# Patient Record
Sex: Female | Born: 1965 | Race: White | Hispanic: No | Smoking: Former smoker
Health system: Southern US, Community
[De-identification: ages and names within clinical notes are randomized; demographics above are authoritative.]

## PROBLEM LIST (undated history)

## (undated) DIAGNOSIS — K219 Gastro-esophageal reflux disease without esophagitis: Secondary | ICD-10-CM

## (undated) DIAGNOSIS — I2510 Atherosclerotic heart disease of native coronary artery without angina pectoris: Secondary | ICD-10-CM

## (undated) DIAGNOSIS — I509 Heart failure, unspecified: Secondary | ICD-10-CM

## (undated) DIAGNOSIS — I350 Nonrheumatic aortic (valve) stenosis: Secondary | ICD-10-CM

## (undated) DIAGNOSIS — E7800 Pure hypercholesterolemia, unspecified: Secondary | ICD-10-CM

## (undated) DIAGNOSIS — E079 Disorder of thyroid, unspecified: Secondary | ICD-10-CM

## (undated) DIAGNOSIS — R112 Nausea with vomiting, unspecified: Secondary | ICD-10-CM

## (undated) DIAGNOSIS — M1990 Unspecified osteoarthritis, unspecified site: Secondary | ICD-10-CM

## (undated) DIAGNOSIS — R011 Cardiac murmur, unspecified: Secondary | ICD-10-CM

## (undated) DIAGNOSIS — I739 Peripheral vascular disease, unspecified: Secondary | ICD-10-CM

## (undated) DIAGNOSIS — I70223 Atherosclerosis of native arteries of extremities with rest pain, bilateral legs: Secondary | ICD-10-CM

## (undated) DIAGNOSIS — Z9889 Other specified postprocedural states: Secondary | ICD-10-CM

## (undated) DIAGNOSIS — Z9289 Personal history of other medical treatment: Secondary | ICD-10-CM

## (undated) DIAGNOSIS — I10 Essential (primary) hypertension: Secondary | ICD-10-CM

## (undated) DIAGNOSIS — D649 Anemia, unspecified: Secondary | ICD-10-CM

## (undated) DIAGNOSIS — I219 Acute myocardial infarction, unspecified: Secondary | ICD-10-CM

## (undated) DIAGNOSIS — I340 Nonrheumatic mitral (valve) insufficiency: Secondary | ICD-10-CM

## (undated) DIAGNOSIS — E039 Hypothyroidism, unspecified: Secondary | ICD-10-CM

## (undated) DIAGNOSIS — E119 Type 2 diabetes mellitus without complications: Secondary | ICD-10-CM

## (undated) HISTORY — DX: Atherosclerosis of native arteries of extremities with rest pain, bilateral legs: I70.223

## (undated) HISTORY — PX: WISDOM TOOTH EXTRACTION: SHX21

## (undated) HISTORY — PX: AMPUTATION TOE: SHX6595

---

## 2000-05-21 ENCOUNTER — Ambulatory Visit (HOSPITAL_BASED_OUTPATIENT_CLINIC_OR_DEPARTMENT_OTHER): Admission: RE | Admit: 2000-05-21 | Discharge: 2000-05-21 | Payer: Self-pay | Admitting: Family Medicine

## 2000-06-30 ENCOUNTER — Ambulatory Visit (HOSPITAL_BASED_OUTPATIENT_CLINIC_OR_DEPARTMENT_OTHER): Admission: RE | Admit: 2000-06-30 | Discharge: 2000-06-30 | Payer: Self-pay | Admitting: Family Medicine

## 2017-10-28 DIAGNOSIS — G5601 Carpal tunnel syndrome, right upper limb: Secondary | ICD-10-CM | POA: Insufficient documentation

## 2017-10-28 DIAGNOSIS — K219 Gastro-esophageal reflux disease without esophagitis: Secondary | ICD-10-CM | POA: Insufficient documentation

## 2017-10-28 DIAGNOSIS — R079 Chest pain, unspecified: Secondary | ICD-10-CM | POA: Insufficient documentation

## 2020-01-06 DIAGNOSIS — S80829A Blister (nonthermal), unspecified lower leg, initial encounter: Secondary | ICD-10-CM | POA: Insufficient documentation

## 2020-01-11 ENCOUNTER — Emergency Department (HOSPITAL_COMMUNITY): Payer: BC Managed Care – PPO

## 2020-01-11 ENCOUNTER — Inpatient Hospital Stay (HOSPITAL_COMMUNITY)
Admission: EM | Admit: 2020-01-11 | Discharge: 2020-01-15 | DRG: 271 | Disposition: A | Discharge status: Home / Self Care | Payer: BC Managed Care – PPO | Attending: Family Medicine | Admitting: Family Medicine

## 2020-01-11 ENCOUNTER — Encounter (HOSPITAL_COMMUNITY): Payer: Self-pay

## 2020-01-11 ENCOUNTER — Other Ambulatory Visit: Payer: Self-pay

## 2020-01-11 DIAGNOSIS — E1165 Type 2 diabetes mellitus with hyperglycemia: Secondary | ICD-10-CM | POA: Diagnosis present

## 2020-01-11 DIAGNOSIS — Z8249 Family history of ischemic heart disease and other diseases of the circulatory system: Secondary | ICD-10-CM

## 2020-01-11 DIAGNOSIS — E039 Hypothyroidism, unspecified: Secondary | ICD-10-CM | POA: Diagnosis present

## 2020-01-11 DIAGNOSIS — L0390 Cellulitis, unspecified: Secondary | ICD-10-CM | POA: Diagnosis present

## 2020-01-11 DIAGNOSIS — E11621 Type 2 diabetes mellitus with foot ulcer: Secondary | ICD-10-CM | POA: Diagnosis present

## 2020-01-11 DIAGNOSIS — Z79890 Hormone replacement therapy (postmenopausal): Secondary | ICD-10-CM | POA: Diagnosis not present

## 2020-01-11 DIAGNOSIS — Z79899 Other long term (current) drug therapy: Secondary | ICD-10-CM

## 2020-01-11 DIAGNOSIS — I351 Nonrheumatic aortic (valve) insufficiency: Secondary | ICD-10-CM | POA: Diagnosis not present

## 2020-01-11 DIAGNOSIS — Z89422 Acquired absence of other left toe(s): Secondary | ICD-10-CM | POA: Diagnosis not present

## 2020-01-11 DIAGNOSIS — E7800 Pure hypercholesterolemia, unspecified: Secondary | ICD-10-CM | POA: Diagnosis present

## 2020-01-11 DIAGNOSIS — Z20822 Contact with and (suspected) exposure to covid-19: Secondary | ICD-10-CM | POA: Diagnosis present

## 2020-01-11 DIAGNOSIS — Z7982 Long term (current) use of aspirin: Secondary | ICD-10-CM | POA: Diagnosis not present

## 2020-01-11 DIAGNOSIS — L97519 Non-pressure chronic ulcer of other part of right foot with unspecified severity: Secondary | ICD-10-CM | POA: Diagnosis present

## 2020-01-11 DIAGNOSIS — E1151 Type 2 diabetes mellitus with diabetic peripheral angiopathy without gangrene: Secondary | ICD-10-CM | POA: Diagnosis present

## 2020-01-11 DIAGNOSIS — L97512 Non-pressure chronic ulcer of other part of right foot with fat layer exposed: Secondary | ICD-10-CM | POA: Diagnosis present

## 2020-01-11 DIAGNOSIS — Z6841 Body Mass Index (BMI) 40.0 and over, adult: Secondary | ICD-10-CM

## 2020-01-11 DIAGNOSIS — I350 Nonrheumatic aortic (valve) stenosis: Secondary | ICD-10-CM | POA: Diagnosis not present

## 2020-01-11 DIAGNOSIS — R011 Cardiac murmur, unspecified: Secondary | ICD-10-CM | POA: Diagnosis present

## 2020-01-11 DIAGNOSIS — L97509 Non-pressure chronic ulcer of other part of unspecified foot with unspecified severity: Secondary | ICD-10-CM | POA: Diagnosis not present

## 2020-01-11 DIAGNOSIS — Z87891 Personal history of nicotine dependence: Secondary | ICD-10-CM

## 2020-01-11 DIAGNOSIS — I739 Peripheral vascular disease, unspecified: Secondary | ICD-10-CM | POA: Diagnosis not present

## 2020-01-11 DIAGNOSIS — Z833 Family history of diabetes mellitus: Secondary | ICD-10-CM

## 2020-01-11 DIAGNOSIS — I998 Other disorder of circulatory system: Secondary | ICD-10-CM | POA: Diagnosis present

## 2020-01-11 DIAGNOSIS — I70221 Atherosclerosis of native arteries of extremities with rest pain, right leg: Secondary | ICD-10-CM | POA: Diagnosis present

## 2020-01-11 DIAGNOSIS — E785 Hyperlipidemia, unspecified: Secondary | ICD-10-CM | POA: Diagnosis present

## 2020-01-11 DIAGNOSIS — E1140 Type 2 diabetes mellitus with diabetic neuropathy, unspecified: Secondary | ICD-10-CM | POA: Diagnosis present

## 2020-01-11 DIAGNOSIS — I10 Essential (primary) hypertension: Secondary | ICD-10-CM | POA: Diagnosis present

## 2020-01-11 DIAGNOSIS — L97511 Non-pressure chronic ulcer of other part of right foot limited to breakdown of skin: Secondary | ICD-10-CM

## 2020-01-11 DIAGNOSIS — L03115 Cellulitis of right lower limb: Secondary | ICD-10-CM | POA: Diagnosis present

## 2020-01-11 HISTORY — DX: Disorder of thyroid, unspecified: E07.9

## 2020-01-11 HISTORY — DX: Essential (primary) hypertension: I10

## 2020-01-11 HISTORY — DX: Pure hypercholesterolemia, unspecified: E78.00

## 2020-01-11 HISTORY — DX: Type 2 diabetes mellitus without complications: E11.9

## 2020-01-11 LAB — URINALYSIS, ROUTINE W REFLEX MICROSCOPIC
Bilirubin Urine: NEGATIVE
Glucose, UA: >=500 mg/dL — AB
Hgb urine dipstick: NEGATIVE
Ketones, ur: NEGATIVE mg/dL
Leukocytes,Ua: NEGATIVE
Nitrite: NEGATIVE
Protein, ur: >=300 mg/dL — AB
Specific Gravity, Urine: 1.021 (ref 1.005–1.030)
pH: 5 (ref 5.0–8.0)

## 2020-01-11 LAB — SARS CORONAVIRUS 2 BY RT PCR (HOSPITAL ORDER, PERFORMED IN ~~LOC~~ HOSPITAL LAB): SARS Coronavirus 2: NEGATIVE

## 2020-01-11 LAB — CBC WITH DIFFERENTIAL/PLATELET
Abs Immature Granulocytes: 0.06 10*3/uL (ref 0.00–0.07)
Basophils Absolute: 0.1 10*3/uL (ref 0.0–0.1)
Basophils Relative: 0 %
Eosinophils Absolute: 0.1 10*3/uL (ref 0.0–0.5)
Eosinophils Relative: 1 %
HCT: 31.7 % — ABNORMAL LOW (ref 36.0–46.0)
Hemoglobin: 10 g/dL — ABNORMAL LOW (ref 12.0–15.0)
Immature Granulocytes: 1 %
Lymphocytes Relative: 22 %
Lymphs Abs: 2.5 10*3/uL (ref 0.7–4.0)
MCH: 28.7 pg (ref 26.0–34.0)
MCHC: 31.5 g/dL (ref 30.0–36.0)
MCV: 91.1 fL (ref 80.0–100.0)
Monocytes Absolute: 0.8 10*3/uL (ref 0.1–1.0)
Monocytes Relative: 7 %
Neutro Abs: 7.8 10*3/uL — ABNORMAL HIGH (ref 1.7–7.7)
Neutrophils Relative %: 69 %
Platelets: 244 10*3/uL (ref 150–400)
RBC: 3.48 MIL/uL — ABNORMAL LOW (ref 3.87–5.11)
RDW: 13.1 % (ref 11.5–15.5)
WBC: 11.4 10*3/uL — ABNORMAL HIGH (ref 4.0–10.5)
nRBC: 0 % (ref 0.0–0.2)

## 2020-01-11 LAB — CBG MONITORING, ED: Glucose-Capillary: 313 mg/dL — ABNORMAL HIGH (ref 70–99)

## 2020-01-11 LAB — COMPREHENSIVE METABOLIC PANEL
ALT: 20 U/L (ref 0–44)
AST: 14 U/L — ABNORMAL LOW (ref 15–41)
Albumin: 3.3 g/dL — ABNORMAL LOW (ref 3.5–5.0)
Alkaline Phosphatase: 91 U/L (ref 38–126)
Anion gap: 10 (ref 5–15)
BUN: 25 mg/dL — ABNORMAL HIGH (ref 6–20)
CO2: 25 mmol/L (ref 22–32)
Calcium: 9.1 mg/dL (ref 8.9–10.3)
Chloride: 103 mmol/L (ref 98–111)
Creatinine, Ser: 1.12 mg/dL — ABNORMAL HIGH (ref 0.44–1.00)
GFR calc Af Amer: >60 mL/min (ref >60)
GFR calc non Af Amer: 56 mL/min — ABNORMAL LOW (ref >60)
Glucose, Bld: 341 mg/dL — ABNORMAL HIGH (ref 70–99)
Potassium: 4.2 mmol/L (ref 3.5–5.1)
Sodium: 138 mmol/L (ref 135–145)
Total Bilirubin: 0.5 mg/dL (ref 0.3–1.2)
Total Protein: 7.1 g/dL (ref 6.5–8.1)

## 2020-01-11 LAB — LACTIC ACID, PLASMA: Lactic Acid, Venous: 1.7 mmol/L (ref 0.5–1.9)

## 2020-01-11 MED ORDER — ONDANSETRON HCL 4 MG PO TABS: 4.0000 mg | ORAL_TABLET | Freq: Four times a day (QID) | ORAL | Status: DC | PRN

## 2020-01-11 MED ORDER — LEVOTHYROXINE SODIUM 75 MCG PO TABS
150.0000 ug | ORAL_TABLET | Freq: Every day | ORAL | Status: DC
  Administered 2020-01-12: 150 ug via ORAL
  Filled 2020-01-11: qty 3

## 2020-01-11 MED ORDER — ONDANSETRON HCL 4 MG/2ML IJ SOLN
4.0000 mg | Freq: Four times a day (QID) | INTRAMUSCULAR | Status: DC | PRN
  Filled 2020-01-11: qty 2

## 2020-01-11 MED ORDER — LISINOPRIL-HYDROCHLOROTHIAZIDE 20-25 MG PO TABS: 2.0000 | ORAL_TABLET | Freq: Every morning | ORAL | Status: DC

## 2020-01-11 MED ORDER — SODIUM CHLORIDE 0.9 % IV SOLN: INTRAVENOUS | Status: DC

## 2020-01-11 MED ORDER — LISINOPRIL 40 MG PO TABS
40.0000 mg | ORAL_TABLET | Freq: Every day | ORAL | Status: DC
  Administered 2020-01-12 – 2020-01-15 (×4): 40 mg via ORAL
  Filled 2020-01-11 (×2): qty 1
  Filled 2020-01-11 (×2): qty 4

## 2020-01-11 MED ORDER — GABAPENTIN 100 MG PO CAPS
200.0000 mg | ORAL_CAPSULE | Freq: Every day | ORAL | Status: DC
  Administered 2020-01-11 – 2020-01-14 (×4): 200 mg via ORAL
  Filled 2020-01-11 (×4): qty 2

## 2020-01-11 MED ORDER — VANCOMYCIN HCL 750 MG/150ML IV SOLN
750.0000 mg | Freq: Two times a day (BID) | INTRAVENOUS | Status: DC
  Administered 2020-01-12 – 2020-01-15 (×7): 750 mg via INTRAVENOUS
  Filled 2020-01-11 (×10): qty 150

## 2020-01-11 MED ORDER — HYDROCHLOROTHIAZIDE 50 MG PO TABS
50.0000 mg | ORAL_TABLET | Freq: Every day | ORAL | Status: DC
  Filled 2020-01-11 (×2): qty 1

## 2020-01-11 MED ORDER — INSULIN ASPART 100 UNIT/ML ~~LOC~~ SOLN
0.0000 [IU] | Freq: Three times a day (TID) | SUBCUTANEOUS | Status: DC
  Administered 2020-01-12: 3 [IU] via SUBCUTANEOUS
  Filled 2020-01-11: qty 1

## 2020-01-11 MED ORDER — ATORVASTATIN CALCIUM 40 MG PO TABS
40.0000 mg | ORAL_TABLET | Freq: Every day | ORAL | Status: DC
  Administered 2020-01-12 – 2020-01-15 (×3): 40 mg via ORAL
  Filled 2020-01-11 (×3): qty 1

## 2020-01-11 MED ORDER — PANTOPRAZOLE SODIUM 40 MG PO TBEC
40.0000 mg | DELAYED_RELEASE_TABLET | Freq: Every day | ORAL | Status: DC
  Administered 2020-01-12 – 2020-01-15 (×3): 40 mg via ORAL
  Filled 2020-01-11 (×3): qty 1

## 2020-01-11 MED ORDER — INSULIN GLARGINE 100 UNIT/ML ~~LOC~~ SOLN
10.0000 [IU] | Freq: Every day | SUBCUTANEOUS | Status: DC
  Administered 2020-01-11 – 2020-01-14 (×4): 10 [IU] via SUBCUTANEOUS
  Filled 2020-01-11 (×7): qty 0.1

## 2020-01-11 MED ORDER — VANCOMYCIN HCL 2000 MG/400ML IV SOLN
2000.0000 mg | Freq: Once | INTRAVENOUS | Status: AC
  Administered 2020-01-11: 2000 mg via INTRAVENOUS
  Filled 2020-01-11: qty 400

## 2020-01-11 MED ORDER — VANCOMYCIN HCL IN DEXTROSE 1-5 GM/200ML-% IV SOLN
1000.0000 mg | Freq: Once | INTRAVENOUS | Status: AC
  Administered 2020-01-11: 1000 mg via INTRAVENOUS
  Filled 2020-01-11: qty 200

## 2020-01-11 MED ORDER — ACETAMINOPHEN 500 MG PO TABS
1000.0000 mg | ORAL_TABLET | Freq: Four times a day (QID) | ORAL | Status: DC | PRN
  Administered 2020-01-12 – 2020-01-13 (×2): 1000 mg via ORAL
  Filled 2020-01-11 (×2): qty 2

## 2020-01-11 MED ORDER — ENOXAPARIN SODIUM 40 MG/0.4ML ~~LOC~~ SOLN
40.0000 mg | Freq: Two times a day (BID) | SUBCUTANEOUS | Status: DC
  Administered 2020-01-11 – 2020-01-13 (×4): 40 mg via SUBCUTANEOUS
  Filled 2020-01-11 (×4): qty 0.4

## 2020-01-11 MED ORDER — HYDRALAZINE HCL 25 MG PO TABS: 25.0000 mg | ORAL_TABLET | Freq: Four times a day (QID) | ORAL | Status: DC | PRN

## 2020-01-11 NOTE — Progress Notes (Signed)
Pharmacy Antibiotic Note  Kayla Schmitt is a 54 y.o. female admitted on 01/11/2020 with cellulitis.  Pharmacy has been consulted for vancomycion dosing.  Plan: Vancomycin 750 IV every 12 hours.  Goal trough 15-20 mcg/mL.  Height: 5\' 4"  (162.6 cm) Weight: 108.9 kg (240 lb) IBW/kg (Calculated) : 54.7  Temp (24hrs), Avg:99 F (37.2 C), Min:98.7 F (37.1 C), Max:99.2 F (37.3 C)  Recent Labs  Lab 01/11/20 1233  WBC 11.4*  CREATININE 1.12*  LATICACIDVEN 1.7    Estimated Creatinine Clearance: 70.1 mL/min (A) (by C-G formula based on SCr of 1.12 mg/dL (H)).    Allergies  Allergen Reactions   Doxycycline Nausea And Vomiting    extreme   Penicillins     Did it involve swelling of the face/tongue/throat, SOB, or low BP? no Did it involve sudden or severe rash/hives, skin peeling, or any reaction on the inside of your mouth or nose? yes Did you need to seek medical attention at a hospital or doctor's office? yes When did it last happen?childhood allergy If all above answers are NO, may proceed with cephalosporin use.     Antimicrobials this admission: 9/13 vancomycin >>   Microbiology results: 9/13 BCx: sent  Thank you for allowing pharmacy to be a part of this patients care.  10/13 [place]hower 01/11/2020 6:29 PM
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
## 2020-01-11 NOTE — ED Triage Notes (Signed)
Pt with wound on the top of her right foot, fever up to 99.9. Pt is diabetic. Pt has had wound approx 6-8 weeks

## 2020-01-11 NOTE — ED Provider Notes (Signed)
[facility] EMERGENCY DEPARTMENT Provider Note   CSN: [id number] Arrival date & time: 01/11/20  1113     History Chief Complaint  Patient presents with  . Foot Pain    Kayla Schmitt is a 54 y.o. female.  The history is provided by the patient. No language interpreter was used.  Foot Pain This is a new problem. The current episode started more than 2 days ago. The problem occurs constantly. The problem has been gradually worsening. Pertinent negatives include no headaches and no shortness of breath. Nothing aggravates the symptoms. Nothing relieves the symptoms. The treatment provided no relief.  Pt complains of a fever for the past 2 days.  Pt has a wound on her foot that she had had since July.  Pt has been on Keflex for several weeks.  Pt was scheduled for vascular studies today but came here due to fever.     Past Medical History:  Diagnosis Date  . Diabetes mellitus without complication (HCC)   . Hypercholesteremia   . Hypertension   . Thyroid disease     There are no problems to display for this patient.   Past Surgical History:  Procedure Laterality Date  . AMPUTATION TOE       OB History   No obstetric history on file.     No family history on file.  Social History   Tobacco Use  . Smoking status: Former Games developer  . Smokeless tobacco: Never Used  Substance Use Topics  . Alcohol use: Not Currently  . Drug use: Yes    Types: Marijuana    Comment: occ    Home Medications Prior to Admission medications   Not on File    Allergies    Penicillins  Review of Systems   Review of Systems  Constitutional: Positive for fever.  Respiratory: Negative for shortness of breath.   Musculoskeletal: Positive for myalgias.  Skin: Positive for wound.  Neurological: Negative for headaches.  All other systems reviewed and are negative.   Physical Exam Updated Vital Signs BP (!) 150/85 (BP Location: Right Arm)   Pulse (!) 104   Temp 99.2 F (37.3 C) (Oral)    Resp 18   Ht 5\' 4"  (1.626 m)   Wt 108.9 kg   SpO2 98%   BMI 41.20 kg/m   Physical Exam Vitals and nursing note reviewed.  Constitutional:      Appearance: She is well-developed.  HENT:     Head: Normocephalic.  Cardiovascular:     Rate and Rhythm: Normal rate and regular rhythm.     Heart sounds: Normal heart sounds.  Pulmonary:     Effort: Pulmonary effort is normal.  Abdominal:     General: There is no distension.  Musculoskeletal:        General: Normal range of motion.     Cervical back: Normal range of motion.     Comments: Redness right foot, 3cm dry appearing wound. 1cm ulcer,   Neurological:     Mental Status: She is alert and oriented to person, place, and time.  Psychiatric:        Mood and Affect: Mood normal.     ED Results / Procedures / Treatments   Labs (all labs ordered are listed, but only abnormal results are displayed) Labs Reviewed  CBC WITH DIFFERENTIAL/PLATELET - Abnormal; Notable for the following components:      Result Value   WBC 11.4 (*)    RBC 3.48 (*)    Hemoglobin  10.0 (*)    HCT 31.7 (*)    Neutro Abs 7.8 (*)    All other components within normal limits  COMPREHENSIVE METABOLIC PANEL - Abnormal; Notable for the following components:   Glucose, Bld 341 (*)    BUN 25 (*)    Creatinine, Ser 1.12 (*)    Albumin 3.3 (*)    AST 14 (*)    GFR calc non Af Amer 56 (*)    All other components within normal limits  URINALYSIS, ROUTINE W REFLEX MICROSCOPIC - Abnormal; Notable for the following components:   APPearance HAZY (*)    Glucose, UA >=500 (*)    Protein, ur >=300 (*)    Bacteria, UA RARE (*)    All other components within normal limits  CBG MONITORING, ED - Abnormal; Notable for the following components:   Glucose-Capillary 313 (*)    All other components within normal limits  LACTIC ACID, PLASMA    EKG None  Radiology DG Foot Complete Right  Result Date: 01/11/2020 CLINICAL DATA:  Foot wound dorsally EXAM: RIGHT FOOT  COMPLETE - 3+ VIEW COMPARISON:  None. FINDINGS: Negative for fracture or osteomyelitis Soft tissue swelling and wound in the dorsum of the metatarsal region. No arthropathy. Calcaneal spurring.  Arterial calcification. IMPRESSION: No acute skeletal abnormality. Electronically Signed   By: Marlan Palau M.D.   On: 01/11/2020 14:03    Procedures Procedures (including critical care time)  Medications Ordered in ED Medications - No data to display  ED Course  I have reviewed the triage vital signs and the nursing notes.  Pertinent labs & imaging results that were available during my care of the patient were reviewed by me and considered in my medical decision making (see chart for details).    MDM Rules/Calculators/A&P                          MDM: Patient is being seen by the wound care center in [facility] she was scheduled for vascular studies today.  Patient reports the provider at the wound care clinic could not feel a pulse in her foot.  Patient reports they told her they were going to refer her to a vascular surgeon but she has not received that referral yet.  Patient reports she has been on Keflex for approximately 4 weeks she was also on Bactrim for a week.  Patient states she did have 3 ulcers prior to starting on the Keflex.  An ulcer on her left leg has resolved the ulcer on her right foot is improving.  Patient reports her foot has not been hot and red prior to yesterday.   Pt has elevated bun and creat, slight elevation of wbc count.  Pt has been on outpatient antibiotics.  Pt has covid testing today that was negative  I will consult hospitalist for admission as patient has a cellulitis and has failed outpatient treatment. Final Clinical Impression(s) / ED Diagnoses Final diagnoses:  Cellulitis of right foot    Rx / DC Orders ED Discharge Orders    None       Osie Cheeks 01/11/20 1539    Bethann Berkshire, MD 01/13/20 1253

## 2020-01-12 ENCOUNTER — Inpatient Hospital Stay (HOSPITAL_COMMUNITY): Payer: BC Managed Care – PPO

## 2020-01-12 ENCOUNTER — Other Ambulatory Visit: Payer: Self-pay

## 2020-01-12 DIAGNOSIS — I350 Nonrheumatic aortic (valve) stenosis: Secondary | ICD-10-CM

## 2020-01-12 DIAGNOSIS — I351 Nonrheumatic aortic (valve) insufficiency: Secondary | ICD-10-CM

## 2020-01-12 LAB — ECHOCARDIOGRAM COMPLETE
AR max vel: 1.51 cm2
AV Area VTI: 1.57 cm2
AV Area mean vel: 1.44 cm2
AV Mean grad: 15 mmHg
AV Peak grad: 24.6 mmHg
Ao pk vel: 2.48 m/s
Area-P 1/2: 3.63 cm2
Height: 64 in
S' Lateral: 2.45 cm
Weight: 3840 oz

## 2020-01-12 LAB — CBC
HCT: 28.4 % — ABNORMAL LOW (ref 36.0–46.0)
Hemoglobin: 9.1 g/dL — ABNORMAL LOW (ref 12.0–15.0)
MCH: 29.2 pg (ref 26.0–34.0)
MCHC: 32 g/dL (ref 30.0–36.0)
MCV: 91 fL (ref 80.0–100.0)
Platelets: 219 10*3/uL (ref 150–400)
RBC: 3.12 MIL/uL — ABNORMAL LOW (ref 3.87–5.11)
RDW: 13.1 % (ref 11.5–15.5)
WBC: 7.9 10*3/uL (ref 4.0–10.5)
nRBC: 0 % (ref 0.0–0.2)

## 2020-01-12 LAB — COMPREHENSIVE METABOLIC PANEL
ALT: 18 U/L (ref 0–44)
AST: 11 U/L — ABNORMAL LOW (ref 15–41)
Albumin: 3 g/dL — ABNORMAL LOW (ref 3.5–5.0)
Alkaline Phosphatase: 91 U/L (ref 38–126)
Anion gap: 8 (ref 5–15)
BUN: 22 mg/dL — ABNORMAL HIGH (ref 6–20)
CO2: 24 mmol/L (ref 22–32)
Calcium: 8.5 mg/dL — ABNORMAL LOW (ref 8.9–10.3)
Chloride: 103 mmol/L (ref 98–111)
Creatinine, Ser: 0.81 mg/dL (ref 0.44–1.00)
GFR calc Af Amer: >60 mL/min (ref >60)
GFR calc non Af Amer: >60 mL/min (ref >60)
Glucose, Bld: 303 mg/dL — ABNORMAL HIGH (ref 70–99)
Potassium: 3.6 mmol/L (ref 3.5–5.1)
Sodium: 135 mmol/L (ref 135–145)
Total Bilirubin: 0.5 mg/dL (ref 0.3–1.2)
Total Protein: 6.6 g/dL (ref 6.5–8.1)

## 2020-01-12 LAB — SEDIMENTATION RATE: Sed Rate: 125 mm/hr — ABNORMAL HIGH (ref 0–22)

## 2020-01-12 LAB — HEMOGLOBIN A1C
Hgb A1c MFr Bld: 11 % — ABNORMAL HIGH (ref 4.8–5.6)
Mean Plasma Glucose: 269 mg/dL

## 2020-01-12 LAB — GLUCOSE, CAPILLARY
Glucose-Capillary: 225 mg/dL — ABNORMAL HIGH (ref 70–99)
Glucose-Capillary: 237 mg/dL — ABNORMAL HIGH (ref 70–99)

## 2020-01-12 LAB — CBG MONITORING, ED
Glucose-Capillary: 240 mg/dL — ABNORMAL HIGH (ref 70–99)
Glucose-Capillary: 343 mg/dL — ABNORMAL HIGH (ref 70–99)

## 2020-01-12 LAB — HIV ANTIBODY (ROUTINE TESTING W REFLEX): HIV Screen 4th Generation wRfx: NONREACTIVE

## 2020-01-12 LAB — C-REACTIVE PROTEIN: CRP: 15.8 mg/dL — ABNORMAL HIGH (ref <1.0)

## 2020-01-12 MED ORDER — INSULIN ASPART 100 UNIT/ML ~~LOC~~ SOLN
4.0000 [IU] | Freq: Three times a day (TID) | SUBCUTANEOUS | Status: DC
  Administered 2020-01-12 – 2020-01-13 (×3): 4 [IU] via SUBCUTANEOUS
  Filled 2020-01-12: qty 1

## 2020-01-12 MED ORDER — INSULIN ASPART 100 UNIT/ML ~~LOC~~ SOLN
0.0000 [IU] | Freq: Every day | SUBCUTANEOUS | Status: DC
  Administered 2020-01-12 – 2020-01-14 (×2): 2 [IU] via SUBCUTANEOUS

## 2020-01-12 MED ORDER — LEVOTHYROXINE SODIUM 75 MCG PO TABS
150.0000 ug | ORAL_TABLET | Freq: Every day | ORAL | Status: DC
  Administered 2020-01-13 – 2020-01-15 (×3): 150 ug via ORAL
  Filled 2020-01-12 (×3): qty 2

## 2020-01-12 MED ORDER — IOHEXOL 350 MG/ML SOLN
150.0000 mL | Freq: Once | INTRAVENOUS | Status: AC | PRN
  Administered 2020-01-12: 125 mL via INTRAVENOUS

## 2020-01-12 MED ORDER — INSULIN ASPART 100 UNIT/ML ~~LOC~~ SOLN
0.0000 [IU] | Freq: Three times a day (TID) | SUBCUTANEOUS | Status: DC
  Administered 2020-01-12: 7 [IU] via SUBCUTANEOUS
  Administered 2020-01-12: 15 [IU] via SUBCUTANEOUS
  Administered 2020-01-13: 11 [IU] via SUBCUTANEOUS
  Administered 2020-01-13 (×2): 4 [IU] via SUBCUTANEOUS
  Administered 2020-01-14: 7 [IU] via SUBCUTANEOUS
  Administered 2020-01-15: 4 [IU] via SUBCUTANEOUS
  Filled 2020-01-12: qty 1

## 2020-01-12 MED ORDER — INSULIN STARTER KIT- PEN NEEDLES (ENGLISH)
1.0000 | Freq: Once | Status: DC
  Filled 2020-01-12: qty 1

## 2020-01-12 MED ORDER — LIVING WELL WITH DIABETES BOOK: Freq: Once | Status: AC

## 2020-01-12 MED ORDER — COLLAGENASE 250 UNIT/GM EX OINT
TOPICAL_OINTMENT | Freq: Every day | CUTANEOUS | Status: DC
  Filled 2020-01-12: qty 30

## 2020-01-12 NOTE — Consult Note (Addendum)
WOC Nurse Consult Note: Reason for Consult: Consult requested for right foot.  Performed remotely after review of the photo and progress notes in the EMR.  Wound type: Full thickness wound to right anterior foot; 100% yellow slough Measurement: Approx 5X5cm with mod amt tan drainage, according to progress notes Periwound: Generalized erythremia and edema surrounding Dressing procedure/placement/frequency: Topical treatment orders provided for bedside nurses to perform daily to provide enzymatic debridement of nonviable tissue as follows: Apply Santyl to right foot wound Q day, then cover with moist gauze and dry dressing and kerlex.   ABI results indicated deceased perfusion to right foot. MRI indicates: "Short segment complete tear of the extensor hallucis longus tendon deep to the ulcer with 1.2 cm retraction." This complex medical condition is beyond the scope of practice for WOC nurses.  Please consult podiatry or ortho/surgical team for further plan of care. Please re-consult if further assistance is needed.  Thank-you,  Cammie Mcgee MSN, RN, CWOCN, [place], CNS [phone]

## 2020-01-12 NOTE — Progress Notes (Signed)
Inpatient Diabetes Program Recommendations  AACE/ADA: New Consensus Statement on Inpatient Glycemic Control (2015)  Target Ranges:  Prepandial:   less than 140 mg/dL      Peak postprandial:   less than 180 mg/dL (1-2 hours)      Critically ill patients:  140 - 180 mg/dL   Lab Results  Component Value Date   GLUCAP 343 (H) 01/12/2020   HGBA1C 11.0 (H) 01/11/2020    Review of Glycemic Control Results for Schmitt, Kayla (MRN [id number]) as of 01/12/2020 14:50  Ref. Range 01/11/2020 11:48 01/12/2020 08:03 01/12/2020 12:47  Glucose-Capillary Latest Ref Range: 70 - 99 mg/dL 313 (H) 240 (H) 343 (H)   Diabetes history: DM 2 Outpatient Diabetes medications:  Metformin XR 1000 mg bid Current orders for Inpatient glycemic control:  Novolog resistant tid with meals and HS Novolog 4 units tid with meals Lantus 10 units q HS  Inpatient Diabetes Program Recommendations:    Please consider increasing Lantus to 20 units q HS.   Spoke with patient by phone regarding A1C and likely need for insulin at d/c.  Patient states that she has never been on insulin but is agreeable to being on insulin if it will help her infection clear up.  We discussed hospital goals for blood sugars and her A1C goal of 7%.  She states that her A1C has been this high before and that she was able to get it down with lifestyle modifications.  Discussed potential use of insulin at home and she is agreeable to using insulin pen.  Will ask bedside RN to show her insulin pen video and other DM videos on patient education network.  Will also order LWWD booklet and insulin starter kit.  Also will send patient digital link on how to use insulin pen.    We also discussed that she may be a good candidate for once weekly GLP if primary MD would order it (glucose dependent and also may help some with weight loss).    Will follow.   Thanks Jenny Simpson, RN, BC-ADM Inpatient Diabetes Coordinator Pager [phone] (8a-5p)     

## 2020-01-12 NOTE — Progress Notes (Signed)
*  PRELIMINARY RESULTS* Echocardiogram 2D Echocardiogram has been performed.  Stacey Drain 01/12/2020, 10:00 AM
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
## 2020-01-13 ENCOUNTER — Encounter (HOSPITAL_COMMUNITY): Payer: Self-pay | Admitting: Family Medicine

## 2020-01-13 DIAGNOSIS — L97512 Non-pressure chronic ulcer of other part of right foot with fat layer exposed: Secondary | ICD-10-CM

## 2020-01-13 DIAGNOSIS — I739 Peripheral vascular disease, unspecified: Secondary | ICD-10-CM

## 2020-01-13 DIAGNOSIS — L97511 Non-pressure chronic ulcer of other part of right foot limited to breakdown of skin: Secondary | ICD-10-CM

## 2020-01-13 DIAGNOSIS — L03115 Cellulitis of right lower limb: Secondary | ICD-10-CM

## 2020-01-13 LAB — CBC
HCT: 28.7 % — ABNORMAL LOW (ref 36.0–46.0)
Hemoglobin: 9.1 g/dL — ABNORMAL LOW (ref 12.0–15.0)
MCH: 29.1 pg (ref 26.0–34.0)
MCHC: 31.7 g/dL (ref 30.0–36.0)
MCV: 91.7 fL (ref 80.0–100.0)
Platelets: 224 10*3/uL (ref 150–400)
RBC: 3.13 MIL/uL — ABNORMAL LOW (ref 3.87–5.11)
RDW: 13 % (ref 11.5–15.5)
WBC: 6.8 10*3/uL (ref 4.0–10.5)
nRBC: 0 % (ref 0.0–0.2)

## 2020-01-13 LAB — BASIC METABOLIC PANEL
Anion gap: 11 (ref 5–15)
BUN: 19 mg/dL (ref 6–20)
CO2: 24 mmol/L (ref 22–32)
Calcium: 8.7 mg/dL — ABNORMAL LOW (ref 8.9–10.3)
Chloride: 102 mmol/L (ref 98–111)
Creatinine, Ser: 0.94 mg/dL (ref 0.44–1.00)
GFR calc Af Amer: >60 mL/min (ref >60)
GFR calc non Af Amer: >60 mL/min (ref >60)
Glucose, Bld: 215 mg/dL — ABNORMAL HIGH (ref 70–99)
Potassium: 3.3 mmol/L — ABNORMAL LOW (ref 3.5–5.1)
Sodium: 137 mmol/L (ref 135–145)

## 2020-01-13 LAB — GLUCOSE, CAPILLARY
Glucose-Capillary: 198 mg/dL — ABNORMAL HIGH (ref 70–99)
Glucose-Capillary: 273 mg/dL — ABNORMAL HIGH (ref 70–99)
Glucose-Capillary: 169 mg/dL — ABNORMAL HIGH (ref 70–99)
Glucose-Capillary: 171 mg/dL — ABNORMAL HIGH (ref 70–99)

## 2020-01-13 LAB — SEDIMENTATION RATE: Sed Rate: 126 mm/hr — ABNORMAL HIGH (ref 0–22)

## 2020-01-13 LAB — MAGNESIUM: Magnesium: 1.4 mg/dL — ABNORMAL LOW (ref 1.7–2.4)

## 2020-01-13 LAB — C-REACTIVE PROTEIN: CRP: 13 mg/dL — ABNORMAL HIGH (ref <1.0)

## 2020-01-13 MED ORDER — MAGNESIUM SULFATE 4 GM/100ML IV SOLN
4.0000 g | Freq: Once | INTRAVENOUS | Status: AC
  Administered 2020-01-13: 4 g via INTRAVENOUS
  Filled 2020-01-13: qty 100

## 2020-01-13 MED ORDER — INSULIN ASPART 100 UNIT/ML ~~LOC~~ SOLN
5.0000 [IU] | Freq: Three times a day (TID) | SUBCUTANEOUS | Status: DC
  Administered 2020-01-13: 5 [IU] via SUBCUTANEOUS

## 2020-01-13 MED ORDER — POTASSIUM CHLORIDE CRYS ER 20 MEQ PO TBCR
60.0000 meq | EXTENDED_RELEASE_TABLET | Freq: Once | ORAL | Status: AC
  Administered 2020-01-13: 60 meq via ORAL
  Filled 2020-01-13: qty 3

## 2020-01-13 MED ORDER — ENOXAPARIN SODIUM 60 MG/0.6ML ~~LOC~~ SOLN: 55.0000 mg | Freq: Two times a day (BID) | SUBCUTANEOUS | Status: DC

## 2020-01-13 MED ORDER — ENOXAPARIN SODIUM 40 MG/0.4ML ~~LOC~~ SOLN
40.0000 mg | Freq: Two times a day (BID) | SUBCUTANEOUS | Status: DC
  Administered 2020-01-14 – 2020-01-15 (×2): 40 mg via SUBCUTANEOUS
  Filled 2020-01-13 (×3): qty 0.4

## 2020-01-13 NOTE — Progress Notes (Signed)
Patient requested that her dressing be changed later in the morning, after she showers.  Will pass to day shift.

## 2020-01-13 NOTE — Consult Note (Addendum)
I was present with the medical student for this service. I personally verified the history of present illness, performed the physical exam, and made the plan for this encounter. I have verified the medical student's documentation and made modifications where appropriately. I have personally documented in my own words a brief history, physical, and plan below.     Patient with large dorsal foot ulcer on the right she says has been there since July. She has DM, HTN, HLD, and has been seen by Podiatry. She started having fevers and worsening redness and swelling around the ulcer. She came to the ED and was admitted yesterday. We were consulted today. Her CTA from yesterday read came back today around 9AM and shows high grade stenosis of the SFA. I discussed the case with Dr. Wynetta Emery earlier and he discussed with Vascular who agreed she needs urgent imaging and revascularization. Her MRI done yesterday also demonstrates a ruptured extensor hallucis longus tendon in the deep portion of the ulcer.   BP (!) 157/68 (BP Location: Left Arm)   Pulse 77   Temp 98.9 F (37.2 C) (Oral)   Resp 18   Ht _0  (1.626 m)   Wt 108.9 kg   SpO2 97%   BMI 41.20 kg/m   cellulitis and dorsal foot ulcer on right  Personally reviewed imaging and SFA with high grade stenosis on right, MRI with ulceration on the dorsum of foot no abscess or  Fluid collection.  Patient with diabetes, PAD, and dorsal foot ulcer that has ruptured tendon and CTA with SFA high grade stenosis.  Transfer to [facility] for Vascular evaluation and further management.   [name], MD [facility] [address] [place], [place] [address] [phone] (office)    Reason for Consult: Nonhealing Foot Ulcer Referring Physician: Irwin Brakeman, MD  Kayla Schmitt is an 54 y.o. female.  HPI: Kayla Schmitt a 54 yo white female with a PMHx significant for poorly controlled T2DM (last A1c 11), diabetic neuropathy,  HTN, HLD, and Thyroid disease who presents with nonhealing dorsal right foot ulcer. She reports that around the beginning of July she noticed two large blisters form on the dorsum of her right foot and one blister on the medial aspect of her left lower leg/ankle. These blisters popped and revealed sores underneath. The left ankle sore healed as did the smaller, more proximal right foot sore. However the larger right foot ulcer has persisted, unchanged since that time despite frequent bandage changes, neosporin, and keflex prescribed by her PCP. At no point has she had pain at this ulceration though she notes some swelling.  Most recently on Saturday 01/09/20, Kayla Schmitt experienced a fever and chills. She came to the ED on Monday of this week as this did not improve. Last night she had a fever to 101.4 and felt chills with profuse sweating. At the time of my exam, she was feeling well, sitting up comfortably in bed with no discomfort or complaints. She denies any pain in her foot ankle or lower leg. She states that she has neuropathy and can feel touch in her extremities but has been unable to feel pain for many years.  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  She denies any current fever, body aches, chills, cough, shortness of breath, chest pain, palpitations, abdominal pain, diarrhea, constipation, hematochezia, melena, hematuria, dysuria, or polyuria.  Past Medical History:  Diagnosis  Date  . Diabetes mellitus without complication ([place])   . Hypercholesteremia   . Hypertension   . Thyroid disease     Past Surgical History:  Procedure Laterality Date  . AMPUTATION TOE     Diabetes and heart disease run in the family, mother living  Family History  Problem Relation Age of Onset  . Diabetes Other   . CAD Other     Social History:  reports that she has quit smoking. She has never used smokeless tobacco. She reports  previous alcohol use. She reports current drug use. Drug: Marijuana.  Allergies:  Allergies  Allergen Reactions  . Doxycycline Nausea And Vomiting    extreme  . Penicillins     Did it involve swelling of the face/tongue/throat, SOB, or low BP? no Did it involve sudden or severe rash/hives, skin peeling, or any reaction on the inside of your mouth or nose? yes Did you need to seek medical attention at a hospital or doctor's office? yes When did it last happen?childhood allergy If all above answers are "NO", may proceed with cephalosporin use.     Medications: I have reviewed the patient's current medications. Current Facility-Administered Medications  Medication Dose Route Frequency Provider Last Rate Last Admin  . 0.9 %  sodium chloride infusion   Intravenous Continuous Oswald Hillock, MD   Held at 01/11/20 2250  . acetaminophen (TYLENOL) tablet 1,000 mg  1,000 mg Oral Q6H PRN Oswald Hillock, MD   1,000 mg at 01/12/20 2104  . atorvastatin (LIPITOR) tablet 40 mg  40 mg Oral Daily Oswald Hillock, MD   40 mg at 01/13/20 0948  . collagenase (SANTYL) ointment   Topical Daily Oswald Hillock, MD   Given at 01/13/20 [phone]  . enoxaparin (LOVENOX) injection 40 mg  40 mg Subcutaneous Q12H Johnson, Clanford L, MD      . gabapentin (NEURONTIN) capsule 200 mg  200 mg Oral QHS Oswald Hillock, MD   200 mg at 01/12/20 2104  . hydrALAZINE (APRESOLINE) tablet 25 mg  25 mg Oral Q6H PRN Oswald Hillock, MD      . insulin aspart (novoLOG) injection 0-20 Units  0-20 Units Subcutaneous TID WC Shah, Pratik D, DO   11 Units at 01/13/20 1200  . insulin aspart (novoLOG) injection 0-5 Units  0-5 Units Subcutaneous QHS Heath Lark D, DO   2 Units at 01/12/20 2104  . insulin aspart (novoLOG) injection 5 Units  5 Units Subcutaneous TID WC Johnson, Clanford L, MD      . insulin glargine (LANTUS) injection 10 Units  10 Units Subcutaneous QHS Oswald Hillock, MD   10 Units at 01/12/20 2225  . insulin starter kit- pen  needles (English) 1 kit  1 kit Other Once [place], Marge Duncans, MD      . levothyroxine (SYNTHROID) tablet 150 mcg  150 mcg Oral Q0600 Oswald Hillock, MD   150 mcg at 01/13/20 0427  . lisinopril (ZESTRIL) tablet 40 mg  40 mg Oral Daily Oswald Hillock, MD   40 mg at 01/13/20 0948  . ondansetron (ZOFRAN) tablet 4 mg  4 mg Oral Q6H PRN Oswald Hillock, MD       Or  . ondansetron (ZOFRAN) injection 4 mg  4 mg Intravenous Q6H PRN Oswald Hillock, MD      . pantoprazole (PROTONIX) EC tablet 40 mg  40 mg Oral Daily Oswald Hillock, MD   40 mg at 01/13/20 0947  .  vancomycin (VANCOREADY) IVPB 750 mg/150 mL  750 mg Intravenous Q12H Coffee, Donna Christen, RPH 150 mL/hr at 01/13/20 0421 750 mg at 01/13/20 0421    Results for orders placed or performed during the hospital encounter of 01/11/20 (from the past 48 hour(s))  Blood culture (routine x 2)     Status: None (Preliminary result)   Collection Time: 01/11/20  3:41 PM   Specimen: Left Antecubital; Blood  Result Value Ref Range   Specimen Description LEFT ANTECUBITAL    Special Requests      BOTTLES DRAWN AEROBIC AND ANAEROBIC Blood Culture adequate volume   Culture      NO GROWTH 2 DAYS Performed at [facility], [address] [place], [place] [address]    Report Status PENDING   Blood culture (routine x 2)     Status: None (Preliminary result)   Collection Time: 01/11/20  3:54 PM   Specimen: Right Antecubital; Blood  Result Value Ref Range   Specimen Description RIGHT ANTECUBITAL    Special Requests      BOTTLES DRAWN AEROBIC AND ANAEROBIC Blood Culture adequate volume   Culture      NO GROWTH 2 DAYS Performed at [facility], [address], [place], [place] [address]    Report Status PENDING   SARS Coronavirus 2 by RT PCR (hospital order, performed in [place] hospital lab) Nasopharyngeal Nasopharyngeal Swab     Status: None   Collection Time: 01/11/20  5:38 PM   Specimen: Nasopharyngeal Swab  Result Value Ref Range   SARS Coronavirus 2 NEGATIVE  NEGATIVE    Comment: (NOTE) SARS-CoV-2 target nucleic acids are NOT DETECTED.  The SARS-CoV-2 RNA is generally detectable in upper and lower respiratory specimens during the acute phase of infection. The lowest concentration of SARS-CoV-2 viral copies this assay can detect is 250 copies / mL. A negative result does not preclude SARS-CoV-2 infection and should not be used as the sole basis for treatment or other patient management decisions.  A negative result may occur with improper specimen collection / handling, submission of specimen other than nasopharyngeal swab, presence of viral mutation(s) within the areas targeted by this assay, and inadequate number of viral copies (<250 copies / mL). A negative result must be combined with clinical observations, patient history, and epidemiological information.  Fact Sheet for Patients:   StrictlyIdeas.no  Fact Sheet for Healthcare Providers: BankingDealers.co.za  This test is not yet approved or  cleared by the [place] FDA and has been authorized for detection and/or diagnosis of SARS-CoV-2 by FDA under an Emergency Use Authorization (EUA).  This EUA will remain in effect (meaning this test can be used) for the duration of the COVID-19 declaration under Section 564(b)(1) of the Act, 21 U.S.C. section 360bbb-3(b)(1), unless the authorization is terminated or revoked sooner.  Performed at [facility], [address] [place], [place] [address]   HIV Antibody (routine testing w rflx)     Status: None   Collection Time: 01/12/20  2:40 AM  Result Value Ref Range   HIV Screen 4th Generation wRfx Non Reactive Non Reactive    Comment: Performed at [facility], [place] [address] [place] [address]  CBC     Status: Abnormal   Collection Time: 01/12/20  2:40 AM  Result Value Ref Range   WBC 7.9 4.0 - 10.5 K/uL   RBC 3.12 (L) 3.87 - 5.11 MIL/uL   Hemoglobin 9.1 (L) 12.0 - 15.0  g/dL   HCT 28.4 (  L) 36 - 46 %   MCV 91.0 80.0 - 100.0 fL   MCH 29.2 26.0 - 34.0 pg   MCHC 32.0 30.0 - 36.0 g/dL   RDW 13.1 11.5 - 15.5 %   Platelets 219 150 - 400 K/uL   nRBC 0.0 0.0 - 0.2 %    Comment: Performed at [facility], [address], [place], [place] [address]  Comprehensive metabolic panel     Status: Abnormal   Collection Time: 01/12/20  2:40 AM  Result Value Ref Range   Sodium 135 135 - 145 mmol/L   Potassium 3.6 3.5 - 5.1 mmol/L   Chloride 103 98 - 111 mmol/L   CO2 24 22 - 32 mmol/L   Glucose, Bld 303 (H) 70 - 99 mg/dL    Comment: Glucose reference range applies only to samples taken after fasting for at least 8 hours.   BUN 22 (H) 6 - 20 mg/dL   Creatinine, Ser 0.81 0.44 - 1.00 mg/dL   Calcium 8.5 (L) 8.9 - 10.3 mg/dL   Total Protein 6.6 6.5 - 8.1 g/dL   Albumin 3.0 (L) 3.5 - 5.0 g/dL   AST 11 (L) 15 - 41 U/L   ALT 18 0 - 44 U/L   Alkaline Phosphatase 91 38 - 126 U/L   Total Bilirubin 0.5 0.3 - 1.2 mg/dL   GFR calc non Af Amer >60 >60 mL/min   GFR calc Af Amer >60 >60 mL/min   Anion gap 8 5 - 15    Comment: Performed at [facility], [address], [place], [place] [address]  CBG monitoring, ED     Status: Abnormal   Collection Time: 01/12/20  8:03 AM  Result Value Ref Range   Glucose-Capillary 240 (H) 70 - 99 mg/dL    Comment: Glucose reference range applies only to samples taken after fasting for at least 8 hours.  Sedimentation rate     Status: Abnormal   Collection Time: 01/12/20 11:36 AM  Result Value Ref Range   Sed Rate 125 (H) 0 - 22 mm/hr    Comment: Performed at [facility], [address] [place], [place] [address]  C-reactive protein     Status: Abnormal   Collection Time: 01/12/20 11:36 AM  Result Value Ref Range   CRP 15.8 (H) <1.0 mg/dL    Comment: Performed at [facility], [address], [place], [place] [address]  CBG monitoring, ED     Status: Abnormal   Collection Time: 01/12/20 12:47 PM  Result Value Ref Range   Glucose-Capillary  343 (H) 70 - 99 mg/dL    Comment: Glucose reference range applies only to samples taken after fasting for at least 8 hours.  Glucose, capillary     Status: Abnormal   Collection Time: 01/12/20  5:03 PM  Result Value Ref Range   Glucose-Capillary 225 (H) 70 - 99 mg/dL    Comment: Glucose reference range applies only to samples taken after fasting for at least 8 hours.  Glucose, capillary     Status: Abnormal   Collection Time: 01/12/20  8:26 PM  Result Value Ref Range   Glucose-Capillary 237 (H) 70 - 99 mg/dL    Comment: Glucose reference range applies only to samples taken after fasting for at least 8 hours.  CBC     Status: Abnormal   Collection Time: 01/13/20  6:04 AM  Result Value Ref Range   WBC 6.8 4.0 - 10.5 K/uL   RBC 3.13 (L) 3.87 -  5.11 MIL/uL   Hemoglobin 9.1 (L) 12.0 - 15.0 g/dL   HCT 28.7 (L) 36 - 46 %   MCV 91.7 80.0 - 100.0 fL   MCH 29.1 26.0 - 34.0 pg   MCHC 31.7 30.0 - 36.0 g/dL   RDW 13.0 11.5 - 15.5 %   Platelets 224 150 - 400 K/uL   nRBC 0.0 0.0 - 0.2 %    Comment: Performed at [facility], [address] [place], [place] [address]  Basic metabolic panel     Status: Abnormal   Collection Time: 01/13/20  6:04 AM  Result Value Ref Range   Sodium 137 135 - 145 mmol/L   Potassium 3.3 (L) 3.5 - 5.1 mmol/L   Chloride 102 98 - 111 mmol/L   CO2 24 22 - 32 mmol/L   Glucose, Bld 215 (H) 70 - 99 mg/dL    Comment: Glucose reference range applies only to samples taken after fasting for at least 8 hours.   BUN 19 6 - 20 mg/dL   Creatinine, Ser 0.94 0.44 - 1.00 mg/dL   Calcium 8.7 (L) 8.9 - 10.3 mg/dL   GFR calc non Af Amer >60 >60 mL/min   GFR calc Af Amer >60 >60 mL/min   Anion gap 11 5 - 15    Comment: Performed at [facility], [address] [place], [place] [address]  Magnesium     Status: Abnormal   Collection Time: 01/13/20  6:04 AM  Result Value Ref Range   Magnesium 1.4 (L) 1.7 - 2.4 mg/dL    Comment: Performed at [facility], [address],  [place], [place] [address]  Sedimentation rate     Status: Abnormal   Collection Time: 01/13/20  6:04 AM  Result Value Ref Range   Sed Rate 126 (H) 0 - 22 mm/hr    Comment: Performed at [facility], [address] [place], [place] [address]  C-reactive protein     Status: Abnormal   Collection Time: 01/13/20  6:04 AM  Result Value Ref Range   CRP 13.0 (H) <1.0 mg/dL    Comment: Performed at [facility], [address], [place], [place] [address]  Glucose, capillary     Status: Abnormal   Collection Time: 01/13/20  7:22 AM  Result Value Ref Range   Glucose-Capillary 198 (H) 70 - 99 mg/dL    Comment: Glucose reference range applies only to samples taken after fasting for at least 8 hours.  Glucose, capillary     Status: Abnormal   Collection Time: 01/13/20 11:12 AM  Result Value Ref Range   Glucose-Capillary 273 (H) 70 - 99 mg/dL    Comment: Glucose reference range applies only to samples taken after fasting for at least 8 hours.    CT ANGIO AO+BIFEM W & OR WO CONTRAST  Result Date: 01/13/2020 CLINICAL DATA:  54 year old female with a history of claudication/foot ulcer with diabetes EXAM: CT ANGIOGRAPHY OF ABDOMINAL AORTA WITH ILIOFEMORAL RUNOFF TECHNIQUE: Multidetector CT imaging of the abdomen, pelvis and lower extremities was performed using the standard protocol during bolus administration of intravenous contrast. Multiplanar CT image reconstructions and MIPs were obtained to evaluate the vascular anatomy. CONTRAST:  146m OMNIPAQUE IOHEXOL 350 MG/ML SOLN COMPARISON:  Noninvasive 01/12/2020 FINDINGS: VASCULAR Aorta: Moderate atherosclerotic changes with calcified and soft plaque of the abdominal aorta. No dissection. No aneurysm. No periaortic inflammatory changes. Celiac: Atherosclerotic changes at the origin of the celiac artery. There appears to be 50% narrowing at the origin given  the calcified plaque. SMA: Mixed calcified and soft plaque at the origin of the SMA. SMA remains patent,  however, there is greater than 50% narrowing estimated on the axial and parasagittal images. Renals: - Right: Single right renal artery. Calcifications at the origin of the right renal artery without evidence of high-grade stenosis. - Left: Single left renal artery. Atherosclerotic changes at the origin of the left renal artery without evidence of high-grade stenosis. IMA: IMA is patent with atherosclerotic changes at the origin. Right lower extremity: Diffuse atherosclerotic changes through the right iliac system, moderate to advanced, with predominantly calcified features. No aneurysm or dissection. The greatest degree of atherosclerosis involves the origin of the common iliac artery and the proximal external iliac artery. Hypogastric artery is patent with atherosclerotic changes at the origin. Moderate atherosclerotic changes of the common femoral artery, predominantly on the posterior wall calcifications extend through the common femoral artery into the proximal superficial femoral artery where there is likely high-grade stenosis at the SFA origin. Atherosclerotic changes at the origin of the profunda femoris, with patency of the thigh branches. Advanced atherosclerotic changes of the superficial femoral artery with at least multifocal stenoses secondary to calcified plaque. Calcified plaque within the abductor canal limits evaluation for short segment occlusion. Atherosclerotic changes extend through the popliteal artery, which appears patent. Anterior tibial artery is patent at the origin with minimal calcifications proximally and appears patent to the ankle. Tibioperoneal trunk with moderate calcifications, appears patent. Peroneal artery is patent from the origin to the ankle. Posterior tibial artery is patent from the origin to the ankle with mild atherosclerotic calcifications. Left lower extremity: Diffuse atherosclerotic changes of the left iliac system, moderate to advanced severity. No aneurysm or  dissection. No high-grade stenosis of the common iliac artery. No high-grade stenosis of the external iliac artery. Hypogastric artery remains patent. Moderate atherosclerotic changes of the common femoral artery with predominantly calcifications on the posterior wall. Profunda femoris remains patent with atherosclerotic changes at the origin. Thigh branches patent. Advanced atherosclerotic changes of the left SFA, with multifocal high-grade stenoses proximally and occlusion within the abductor canal. Reconstitution of the popliteal artery with moderate atherosclerotic changes. Calcifications at the origin of the anterior tibial artery, limiting evaluation for stenosis/occlusion. The distal anterior tibial artery remains patent. Tibioperoneal trunk with moderate atherosclerotic changes, remains patent. Calcifications at the origin of the peroneal artery, which appears patent distally. Posterior tibial artery with atherosclerotic changes at the origin, patent in the mid segment and distally. Veins: Unremarkable appearance of the venous system. Review of the MIP images confirms the above findings. NON-VASCULAR Lower chest: No acute. Hepatobiliary: Diffusely decreased attenuation of liver parenchyma. Gallbladder relatively decompressed without stones in the lumen. Pancreas: Unremarkable. Spleen: The span of the spleen on the axial images is greater than 14 cm. Adrenals/Urinary Tract: - Right adrenal gland: 10 mm nodule in the body of the right adrenal gland. Hounsfield units measure 44 on this single phase imaging. - Left adrenal gland: Unremarkable. - Right kidney: No hydronephrosis, nephrolithiasis, inflammation, or ureteral dilation. No focal lesion. - Left Kidney: No hydronephrosis, nephrolithiasis, inflammation, or ureteral dilation. No focal lesion. - Urinary Bladder: Unremarkable. Stomach/Bowel: - Stomach: Hiatal hernia with otherwise unremarkable stomach. - Small bowel: Unremarkable - Appendix: Normal. - Colon:  Unremarkable. Lymphatic: No mesenteric or retroperitoneal adenopathy. Lymph nodes present within the bilateral inguinal region, not enlarged and potentially reactive. Mesenteric: No free fluid or air. No mesenteric adenopathy. Reproductive: Unremarkable appearance of the pelvic organs. Other: No hernia. Musculoskeletal:  No acute displaced fracture. Degenerative changes of the lower thoracic/lumbar spine including vacuum disc phenomenon at T11-T12 and L5-S1. No significant bony canal narrowing. Degenerative changes bilateral hips. Degenerative changes bilateral knees. Soft tissue defect within the dorsum of the right foot overlying the interspace of the first and second metatarsal, compatible with the given history. Mild edema within the superficial tissues of the right leg compared to the left. IMPRESSION: Multilevel PA D, including: -moderate aortic atherosclerosis without aneurysm or high-grade stenosis. Aortic Atherosclerosis (ICD10-I70.0). -moderate right iliac arterial disease, without high-grade stenosis by CT. -advanced right femoropopliteal disease, including partially calcified plaque involving the common femoral artery extending into the proximal SFA. There is a favored SFA occlusion secondary to calcified plaque within the adductor canal. -mild right tibial arterial disease. -moderate left iliac arterial disease without high-grade stenosis or occlusion. -advanced left femoropopliteal disease, including occlusion of the SFA within the adductor canal. -mild left tibial arterial disease. 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 Mild bilateral renal arterial disease. Steatosis. Splenomegaly. 10 mm lesion within the right adrenal gland. While this is most likely adrenal adenoma, the current CT is nondiagnostic. If indicated/warranted, diagnostic imaging to consider would be  adrenal protocol MRI, or adrenal protocol CT. Additional ancillary findings as above. Signed, Dulcy Fanny. Dellia Nims, RPVI [facility] Electronically Signed   By: Corrie Mckusick D.O.   On: 01/13/2020 09:25   MR FOOT RIGHT WO CONTRAST  Result Date: 01/12/2020 CLINICAL DATA:  Dorsal foot wound.  Fever.  Diabetes. EXAM: MRI OF THE RIGHT FOREFOOT WITHOUT CONTRAST TECHNIQUE: Multiplanar, multisequence MR imaging of the right forefoot was performed. No intravenous contrast was administered. COMPARISON:  Right foot x-rays from yesterday. FINDINGS: Bones/Joint/Cartilage No marrow signal abnormality. No fracture or dislocation. Normal alignment. Small first MTP joint effusion. Small amount of fluid in the first and third intermetatarsal bursae. Ligaments Collateral ligaments are intact.  Lisfranc ligament is intact. Muscles and Tendons Short segment complete tear of the extensor hallucis longus tendon with 1.2 cm retraction (series 7, image 23). Increased T2 signal within the intrinsic muscles of the forefoot, nonspecific, but likely related to diabetic muscle changes. Soft tissue Dorsal skin ulceration along the medial forefoot overlying the distal first and second metatarsals. Mild soft tissue swelling of the dorsal forefoot and toes. No fluid collection or hematoma. No soft tissue mass. IMPRESSION: 1. Dorsal skin ulceration along the medial forefoot overlying the distal first and second metatarsals. No abscess or osteomyelitis. 2. Short segment complete tear of the extensor hallucis longus tendon deep to the ulcer with 1.2 cm retraction. Electronically Signed   By: Titus Dubin M.D.   On: 01/12/2020 10:14   US ARTERIAL ABI (SCREENING LOWER EXTREMITY)  Result Date: 01/12/2020 CLINICAL DATA:  54 year old female with history of right foot ulcer. EXAM: NONINVASIVE PHYSIOLOGIC VASCULAR STUDY OF BILATERAL LOWER EXTREMITIES TECHNIQUE: Evaluation of both lower  extremities were performed at rest, including calculation of ankle-brachial indices with single level Doppler, pressure and pulse volume recording. COMPARISON:  None. FINDINGS: Right ABI:  0.91 Left ABI:  0.82 Right Lower Extremity:  Monophasic waveforms at the ankle. Left Lower Extremity:  Monophasic waveforms at the ankle. 0.8-0.89 Mild PAD IMPRESSION: Borderline low right ABI (0.91) and mildly decreased left ABI (0.82) with monophasic waveforms at the level of the ankle bilaterally. These ABIs could be falsely elevated as  the waveforms are discordant, likely secondary to limited vessel compressibility in the setting of diabetes. Consider CTA runoff or lower extremity angiogram as clinically indicated. Electronically Signed   By: Ruthann Cancer MD   On: 01/12/2020 10:28   ECHOCARDIOGRAM COMPLETE  Result Date: 01/12/2020    ECHOCARDIOGRAM REPORT   Patient Name:   [facility] Date of Exam: 01/12/2020 Medical Rec #:  [id number]      Height:       64.0 in Accession #:    [id number]     Weight:       240.0 lb Date of Birth:  18-Nov-1965       BSA:          2.114 m Patient Age:    61 years       BP:           172/81 mmHg Patient Gender: F              HR:           80 bpm. Exam Location:  [facility] Procedure: 2D Echo, Cardiac Doppler and Color Doppler Indications:    Murmur 785.2 / R01.1  History:        Patient has no prior history of Echocardiogram examinations.                 Risk Factors:Hypertension, Diabetes and Dyslipidemia.  Sonographer:    Alvino Chapel RCS Referring Phys: [place]  1. Left ventricular ejection fraction, by estimation, is 65 to 70%. The left ventricle has normal function. The left ventricle has no regional wall motion abnormalities. There is moderate left ventricular hypertrophy. Left ventricular diastolic parameters are consistent with Grade I diastolic dysfunction (impaired relaxation). Elevated left atrial pressure.  2. Right ventricular systolic function is normal.  The right ventricular size is normal.  3. The mitral valve is normal in structure. No evidence of mitral valve regurgitation. No evidence of mitral stenosis.  4. The aortic valve is tricuspid. There is moderate calcification of the aortic valve. There is moderate thickening of the aortic valve. Aortic valve regurgitation is mild. Mild aortic valve stenosis. Aortic valve mean gradient measures 15.0 mmHg. Aortic valve peak gradient measures 24.6 mmHg. Aortic valve area, by VTI measures 1.57 cm.  5. The inferior vena cava is normal in size with greater than 50% respiratory variability, suggesting right atrial pressure of 3 mmHg. FINDINGS  Left Ventricle: Left ventricular ejection fraction, by estimation, is 65 to 70%. The left ventricle has normal function. The left ventricle has no regional wall motion abnormalities. The left ventricular internal cavity size was normal in size. There is  moderate left ventricular hypertrophy. Left ventricular diastolic parameters are consistent with Grade I diastolic dysfunction (impaired relaxation). Elevated left atrial pressure. Right Ventricle: The right ventricular size is normal. No increase in right ventricular wall thickness. Right ventricular systolic function is normal. Left Atrium: Left atrial size was normal in size. Right Atrium: Right atrial size was normal in size. Pericardium: There is no evidence of pericardial effusion. Mitral Valve: The mitral valve is normal in structure. There is mild thickening of the mitral valve leaflet(s). There is mild calcification of the mitral valve leaflet(s). Mild mitral annular calcification. No evidence of mitral valve regurgitation. No evidence of mitral valve stenosis. Tricuspid Valve: The tricuspid valve is normal in structure. Tricuspid valve regurgitation is not demonstrated. No evidence of tricuspid stenosis. Aortic Valve: The aortic valve is tricuspid. There is moderate calcification  of the aortic valve. There is moderate  thickening of the aortic valve. There is moderate aortic valve annular calcification. Aortic valve regurgitation is mild. Mild aortic stenosis is present. Aortic valve mean gradient measures 15.0 mmHg. Aortic valve peak gradient measures 24.6 mmHg. Aortic valve area, by VTI measures 1.57 cm. Pulmonic Valve: The pulmonic valve was not well visualized. Pulmonic valve regurgitation is not visualized. No evidence of pulmonic stenosis. Aorta: The aortic root is normal in size and structure. Pulmonary Artery: Indeterminant PASP, inadequate TR jet. Venous: The inferior vena cava is normal in size with greater than 50% respiratory variability, suggesting right atrial pressure of 3 mmHg. IAS/Shunts: No atrial level shunt detected by color flow Doppler.  LEFT VENTRICLE PLAX 2D LVIDd:         4.48 cm  Diastology LVIDs:         2.45 cm  LV e' medial:    6.42 cm/s LV PW:         1.29 cm  LV E/e' medial:  20.7 LV IVS:        1.41 cm  LV e' lateral:   7.51 cm/s LVOT diam:     2.10 cm  LV E/e' lateral: 17.7 LV SV:         95 LV SV Index:   45 LVOT Area:     3.46 cm  RIGHT VENTRICLE RV S prime:     14.60 cm/s TAPSE (M-mode): 2.0 cm LEFT ATRIUM             Index       RIGHT ATRIUM           Index LA diam:        4.00 cm 1.89 cm/m  RA Area:     16.90 cm LA Vol (A2C):   75.0 ml 35.48 ml/m RA Volume:   43.80 ml  20.72 ml/m LA Vol (A4C):   60.8 ml 28.76 ml/m LA Biplane Vol: 68.4 ml 32.36 ml/m  AORTIC VALVE AV Area (Vmax):    1.51 cm AV Area (Vmean):   1.44 cm AV Area (VTI):     1.57 cm AV Vmax:           248.00 cm/s AV Vmean:          184.000 cm/s AV VTI:            0.602 m AV Peak Grad:      24.6 mmHg AV Mean Grad:      15.0 mmHg LVOT Vmax:         108.00 cm/s LVOT Vmean:        76.500 cm/s LVOT VTI:          0.273 m LVOT/AV VTI ratio: 0.45  AORTA Ao Root diam: 3.20 cm MITRAL VALVE MV Area (PHT): 3.63 cm     SHUNTS MV Decel Time: 209 msec     Systemic VTI:  0.27 m MV E velocity: 133.00 cm/s  Systemic Diam: 2.10 cm MV A  velocity: 141.00 cm/s MV E/A ratio:  0.94 Carlyle Dolly MD Electronically signed by Carlyle Dolly MD Signature Date/Time: 01/12/2020/12:28:04 PM    Final     ROS:  Pertinent items are noted in HPI.  Blood pressure (!) 157/68, pulse 77, temperature 98.9 F (37.2 C), temperature source Oral, resp. rate 18, height _0  (1.626 m), weight 108.9 kg, SpO2 97 %. Physical Exam Vitals and nursing note reviewed.  Constitutional: no acute distress, sitting comfortably in bed Head: atraumatic ENT:  external ears normal Eyes: EOMI Cardiovascular: regular rate and rhythm, 3/6 crescendo-decrescendo, blowing systolic murmur Pulmonary: effort normal, normal breath sounds bilaterally Abdominal: flat, nontender, no rebound tenderness, bowel sounds normal Extremities:  -Two ulcerations on dorsal right foot, smaller healing ulcer more proximally and deep distal wound with overlying exudate and significant surrounding erythema, erythema extends to entirety of dorsal foot and terminated at the ankle proximally and toes distally -no ulcers or wounds noted on the plantar surface of either foot -right extremity warm to touch, left cool to touch -nonpalpable dorsalis pedis, posterior tibial, or popliteal pulses bilaterally Skin: warm and dry Neurological: alert, no focal deficit Psychiatric: normal mood and affect  Assessment/Plan: Kayla. Archuleta a 54 yo white female with a  with nonhealing dorsal right foot ulcer. This is likely vascular in origin. CT with contrast of lower extremities shows significant PAD. Surgical debridement of ulceration not recommended at this time, as the perfusion to this area is significantly impaired. Plan to transport to [facility] for further vascular intervention. -Neuro - not currently in any pain, mentating well -Pulmonary -sating well on room air with normal effort, no history of pulmonary disease -Cardiac - systolic murmur likely due to aortic stenosis, confirmed by  echocardogram on 9/14, pt reports she has had this for many years and her PCP is aware GI - NPO after midnight in preparation for surgery at [facility], no bowel complaints at this time Heme -  H&H stable, no signs of bleeding, ESR and CRP elevated but no signs of osteomyelitis or abscess formation on imaging ID - continue Vancomycin, failed 6-8 week outpatient Keflex, concern that significant PAD may inhibit drug delivery to wound, nonetheless leukocytosis improved to 6.8 today, down from 11.4 on presentation Prophylaxis - Lovenox SQ Disposition - transport to [facility] for Aortic Arteriogram and vascular intervention  Calvert Cantor, Medical Student

## 2020-01-13 NOTE — Progress Notes (Addendum)
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 mean gradient measures 15.0 mmHg. Aortic valve peak gradient measures 24.6 mmHg. Aortic valve area, by VTI measures 1.57 cm. Pulmonic Valve: The pulmonic valve was not well visualized. Pulmonic valve regurgitation is not visualized. No evidence of pulmonic stenosis. Aorta: The aortic root is normal in size and structure. Pulmonary Artery: Indeterminant PASP, inadequate TR jet. Venous: The inferior vena cava is normal in size with greater than 50% respiratory variability, suggesting right atrial pressure of 3 mmHg. IAS/Shunts: No atrial level shunt detected by color flow Doppler.  LEFT VENTRICLE PLAX 2D LVIDd:         4.48 cm  Diastology LVIDs:         2.45 cm  LV e' medial:    6.42 cm/s LV PW:         1.29 cm  LV E/e' medial:  20.7 LV IVS:        1.41 cm  LV e' lateral:   7.51 cm/s LVOT diam:     2.10 cm  LV E/e' lateral: 17.7 LV SV:  95 LV SV Index:   45 LVOT Area:     3.46 cm  RIGHT VENTRICLE RV S  prime:     14.60 cm/s TAPSE (M-mode): 2.0 cm LEFT ATRIUM             Index       RIGHT ATRIUM           Index LA diam:        4.00 cm 1.89 cm/m  RA Area:     16.90 cm LA Vol (A2C):   75.0 ml 35.48 ml/m RA Volume:   43.80 ml  20.72 ml/m LA Vol (A4C):   60.8 ml 28.76 ml/m LA Biplane Vol: 68.4 ml 32.36 ml/m  AORTIC VALVE AV Area (Vmax):    1.51 cm AV Area (Vmean):   1.44 cm AV Area (VTI):     1.57 cm AV Vmax:           248.00 cm/s AV Vmean:          184.000 cm/s AV VTI:            0.602 m AV Peak Grad:      24.6 mmHg AV Mean Grad:      15.0 mmHg LVOT Vmax:         108.00 cm/s LVOT Vmean:        76.500 cm/s LVOT VTI:          0.273 m LVOT/AV VTI ratio: 0.45  AORTA Ao Root diam: 3.20 cm MITRAL VALVE MV Area (PHT): 3.63 cm     SHUNTS MV Decel Time: 209 msec     Systemic VTI:  0.27 m MV E velocity: 133.00 cm/s  Systemic Diam: 2.10 cm MV A velocity: 141.00 cm/s MV E/A ratio:  0.94 Carlyle Dolly MD Electronically signed by Carlyle Dolly MD Signature Date/Time: 01/12/2020/12:28:04 PM    Final    Scheduled Meds:  atorvastatin  40 mg Oral Daily   collagenase   Topical Daily   enoxaparin (LOVENOX) injection  40 mg Subcutaneous Q12H   gabapentin  200 mg Oral QHS   insulin aspart  0-20 Units Subcutaneous TID WC   insulin aspart  0-5 Units Subcutaneous QHS   insulin aspart  4 Units Subcutaneous TID WC   insulin glargine  10 Units Subcutaneous QHS   insulin starter kit- pen needles  1 kit Other Once   levothyroxine  150 mcg Oral Q0600   lisinopril  40 mg Oral Daily   pantoprazole  40 mg Oral Daily   Continuous Infusions:  sodium chloride Stopped (01/11/20 2250)   magnesium sulfate bolus IVPB     vancomycin 750 mg (01/13/20 0421)    LOS: 2 days   Time spent: 45 minutes  Martha Soltys Wynetta Emery, MD How to contact the [facility] Attending or Consulting provider [place] or covering provider during after hours 7P -7A, for this patient?  Check the care team in [facility] and look for a) attending/consulting [facility] provider  listed and b) the [facility] team listed Log into [email or web address] and use [place]'s universal password to access. If you do not have the password, please contact the hospital operator. Locate the [facility] provider you are looking for under [facility] and page to a number that you can be directly reached. If you still have difficulty reaching the provider, please page the [facility] (Director on Call) for the Hospitalists listed on amion for assistance.  If 7PM-7AM, please contact night-coverage [email or web address] 01/13/2020, 12:03 PM

## 2020-01-14 ENCOUNTER — Encounter (HOSPITAL_COMMUNITY)
Admission: EM | Disposition: A | Discharge status: Home / Self Care | Payer: Self-pay | Source: Home / Self Care | Attending: Family Medicine

## 2020-01-14 DIAGNOSIS — E11621 Type 2 diabetes mellitus with foot ulcer: Secondary | ICD-10-CM

## 2020-01-14 HISTORY — PX: PERIPHERAL VASCULAR ATHERECTOMY: CATH118256

## 2020-01-14 HISTORY — PX: PERIPHERAL VASCULAR INTERVENTION: CATH118257

## 2020-01-14 HISTORY — PX: ABDOMINAL AORTOGRAM W/LOWER EXTREMITY: CATH118223

## 2020-01-14 LAB — CBC WITH DIFFERENTIAL/PLATELET
Abs Immature Granulocytes: 0.02 10*3/uL (ref 0.00–0.07)
Basophils Absolute: 0 10*3/uL (ref 0.0–0.1)
Basophils Relative: 1 %
Eosinophils Absolute: 0.2 10*3/uL (ref 0.0–0.5)
Eosinophils Relative: 3 %
HCT: 28.5 % — ABNORMAL LOW (ref 36.0–46.0)
Hemoglobin: 9 g/dL — ABNORMAL LOW (ref 12.0–15.0)
Immature Granulocytes: 0 %
Lymphocytes Relative: 29 %
Lymphs Abs: 1.8 10*3/uL (ref 0.7–4.0)
MCH: 28.8 pg (ref 26.0–34.0)
MCHC: 31.6 g/dL (ref 30.0–36.0)
MCV: 91.3 fL (ref 80.0–100.0)
Monocytes Absolute: 0.5 10*3/uL (ref 0.1–1.0)
Monocytes Relative: 9 %
Neutro Abs: 3.6 10*3/uL (ref 1.7–7.7)
Neutrophils Relative %: 58 %
Platelets: 248 10*3/uL (ref 150–400)
RBC: 3.12 MIL/uL — ABNORMAL LOW (ref 3.87–5.11)
RDW: 13.2 % (ref 11.5–15.5)
WBC: 6.1 10*3/uL (ref 4.0–10.5)
nRBC: 0 % (ref 0.0–0.2)

## 2020-01-14 LAB — COMPREHENSIVE METABOLIC PANEL
ALT: 20 U/L (ref 0–44)
AST: 14 U/L — ABNORMAL LOW (ref 15–41)
Albumin: 2.7 g/dL — ABNORMAL LOW (ref 3.5–5.0)
Alkaline Phosphatase: 111 U/L (ref 38–126)
Anion gap: 9 (ref 5–15)
BUN: 21 mg/dL — ABNORMAL HIGH (ref 6–20)
CO2: 24 mmol/L (ref 22–32)
Calcium: 8.9 mg/dL (ref 8.9–10.3)
Chloride: 103 mmol/L (ref 98–111)
Creatinine, Ser: 1.16 mg/dL — ABNORMAL HIGH (ref 0.44–1.00)
GFR calc Af Amer: >60 mL/min (ref >60)
GFR calc non Af Amer: 54 mL/min — ABNORMAL LOW (ref >60)
Glucose, Bld: 244 mg/dL — ABNORMAL HIGH (ref 70–99)
Potassium: 4 mmol/L (ref 3.5–5.1)
Sodium: 136 mmol/L (ref 135–145)
Total Bilirubin: 0.6 mg/dL (ref 0.3–1.2)
Total Protein: 6.7 g/dL (ref 6.5–8.1)

## 2020-01-14 LAB — SEDIMENTATION RATE: Sed Rate: 136 mm/hr — ABNORMAL HIGH (ref 0–22)

## 2020-01-14 LAB — GLUCOSE, CAPILLARY
Glucose-Capillary: 205 mg/dL — ABNORMAL HIGH (ref 70–99)
Glucose-Capillary: 242 mg/dL — ABNORMAL HIGH (ref 70–99)
Glucose-Capillary: 250 mg/dL — ABNORMAL HIGH (ref 70–99)
Glucose-Capillary: 229 mg/dL — ABNORMAL HIGH (ref 70–99)
Glucose-Capillary: 224 mg/dL — ABNORMAL HIGH (ref 70–99)
Glucose-Capillary: 215 mg/dL — ABNORMAL HIGH (ref 70–99)

## 2020-01-14 LAB — POCT ACTIVATED CLOTTING TIME
Activated Clotting Time: 257 seconds
Activated Clotting Time: 230 seconds

## 2020-01-14 LAB — SURGICAL PCR SCREEN
MRSA, PCR: NEGATIVE
Staphylococcus aureus: NEGATIVE

## 2020-01-14 LAB — C-REACTIVE PROTEIN: CRP: 11.3 mg/dL — ABNORMAL HIGH (ref <1.0)

## 2020-01-14 SURGERY — ABDOMINAL AORTOGRAM W/LOWER EXTREMITY
Anesthesia: LOCAL | Laterality: Right

## 2020-01-14 MED ORDER — CLOPIDOGREL BISULFATE 75 MG PO TABS: 300.0000 mg | ORAL_TABLET | Freq: Once | ORAL | Status: AC

## 2020-01-14 MED ORDER — FENTANYL CITRATE (PF) 100 MCG/2ML IJ SOLN
INTRAMUSCULAR | Status: AC
  Filled 2020-01-14: qty 2

## 2020-01-14 MED ORDER — ONDANSETRON HCL 4 MG/2ML IJ SOLN
4.0000 mg | Freq: Four times a day (QID) | INTRAMUSCULAR | Status: DC | PRN
  Administered 2020-01-14: 4 mg via INTRAVENOUS

## 2020-01-14 MED ORDER — MIDAZOLAM HCL 2 MG/2ML IJ SOLN
INTRAMUSCULAR | Status: DC | PRN
  Administered 2020-01-14 (×2): 1 mg via INTRAVENOUS

## 2020-01-14 MED ORDER — HEPARIN SODIUM (PORCINE) 1000 UNIT/ML IJ SOLN
INTRAMUSCULAR | Status: AC
  Filled 2020-01-14: qty 1

## 2020-01-14 MED ORDER — CLOPIDOGREL BISULFATE 300 MG PO TABS
ORAL_TABLET | ORAL | Status: DC | PRN
  Administered 2020-01-14: 300 mg via ORAL

## 2020-01-14 MED ORDER — SODIUM CHLORIDE 0.9% FLUSH: 3.0000 mL | INTRAVENOUS | Status: DC | PRN

## 2020-01-14 MED ORDER — VIPERSLIDE LUBRICANT OPTIME
TOPICAL | Status: DC | PRN
  Administered 2020-01-14: 300 mL via SURGICAL_CAVITY

## 2020-01-14 MED ORDER — NITROGLYCERIN 1 MG/10 ML FOR IR/CATH LAB
INTRA_ARTERIAL | Status: DC | PRN
  Administered 2020-01-14 (×3): 200 ug

## 2020-01-14 MED ORDER — ONDANSETRON HCL 4 MG/2ML IJ SOLN
INTRAMUSCULAR | Status: AC
  Filled 2020-01-14: qty 2

## 2020-01-14 MED ORDER — LIDOCAINE HCL (PF) 1 % IJ SOLN
INTRAMUSCULAR | Status: DC | PRN
  Administered 2020-01-14: 18 mL via INTRADERMAL

## 2020-01-14 MED ORDER — SODIUM CHLORIDE 0.9 % IV SOLN: 250.0000 mL | INTRAVENOUS | Status: DC | PRN

## 2020-01-14 MED ORDER — ONDANSETRON HCL 4 MG/2ML IJ SOLN
4.0000 mg | Freq: Once | INTRAMUSCULAR | Status: AC
  Administered 2020-01-14: 4 mg via INTRAVENOUS

## 2020-01-14 MED ORDER — SODIUM CHLORIDE 0.9 % IV SOLN: Freq: Once | INTRAVENOUS | Status: AC

## 2020-01-14 MED ORDER — MIDAZOLAM HCL 2 MG/2ML IJ SOLN
INTRAMUSCULAR | Status: AC
  Filled 2020-01-14: qty 2

## 2020-01-14 MED ORDER — VERAPAMIL HCL 2.5 MG/ML IV SOLN: INTRA_ARTERIAL | Status: DC | PRN

## 2020-01-14 MED ORDER — IODIXANOL 320 MG/ML IV SOLN
INTRAVENOUS | Status: DC | PRN
  Administered 2020-01-14: 185 mL

## 2020-01-14 MED ORDER — HEPARIN (PORCINE) IN NACL 1000-0.9 UT/500ML-% IV SOLN
INTRAVENOUS | Status: AC
  Filled 2020-01-14: qty 1000

## 2020-01-14 MED ORDER — HEPARIN SODIUM (PORCINE) 1000 UNIT/ML IJ SOLN
INTRAMUSCULAR | Status: DC | PRN
  Administered 2020-01-14: 4000 [IU] via INTRAVENOUS
  Administered 2020-01-14: 11000 [IU] via INTRAVENOUS

## 2020-01-14 MED ORDER — HYDRALAZINE HCL 20 MG/ML IJ SOLN
INTRAMUSCULAR | Status: AC
  Filled 2020-01-14: qty 1

## 2020-01-14 MED ORDER — FENTANYL CITRATE (PF) 100 MCG/2ML IJ SOLN
INTRAMUSCULAR | Status: DC | PRN
Start: 2020-01-14 — End: 2020-01-14
  Administered 2020-01-14 (×3): 50 ug via INTRAVENOUS

## 2020-01-14 MED ORDER — CLOPIDOGREL BISULFATE 300 MG PO TABS
ORAL_TABLET | ORAL | Status: AC
  Filled 2020-01-14: qty 1

## 2020-01-14 MED ORDER — ACETAMINOPHEN 325 MG PO TABS: 650.0000 mg | ORAL_TABLET | ORAL | Status: DC | PRN

## 2020-01-14 MED ORDER — HYDRALAZINE HCL 20 MG/ML IJ SOLN: 5.0000 mg | INTRAMUSCULAR | Status: DC | PRN

## 2020-01-14 MED ORDER — LABETALOL HCL 5 MG/ML IV SOLN
10.0000 mg | INTRAVENOUS | Status: DC | PRN
  Administered 2020-01-14: 10 mg via INTRAVENOUS
  Filled 2020-01-14: qty 4

## 2020-01-14 MED ORDER — CLOPIDOGREL BISULFATE 75 MG PO TABS
75.0000 mg | ORAL_TABLET | Freq: Every day | ORAL | Status: DC
  Administered 2020-01-15: 75 mg via ORAL
  Filled 2020-01-14: qty 1

## 2020-01-14 MED ORDER — SODIUM CHLORIDE 0.9 % WEIGHT BASED INFUSION
1.0000 mL/kg/h | INTRAVENOUS | Status: AC
  Administered 2020-01-14 (×2): 1 mL/kg/h via INTRAVENOUS

## 2020-01-14 MED ORDER — SODIUM CHLORIDE 0.9% FLUSH
3.0000 mL | Freq: Two times a day (BID) | INTRAVENOUS | Status: DC
  Administered 2020-01-14: 3 mL via INTRAVENOUS

## 2020-01-14 MED ORDER — LIDOCAINE HCL (PF) 1 % IJ SOLN
INTRAMUSCULAR | Status: AC
  Filled 2020-01-14: qty 30

## 2020-01-14 MED ORDER — VERAPAMIL HCL 2.5 MG/ML IV SOLN
INTRAVENOUS | Status: AC
  Filled 2020-01-14: qty 2

## 2020-01-14 MED ORDER — HYDRALAZINE HCL 20 MG/ML IJ SOLN
INTRAMUSCULAR | Status: DC | PRN
  Administered 2020-01-14: 10 mg via INTRAVENOUS

## 2020-01-14 MED ORDER — NITROGLYCERIN IN D5W 200-5 MCG/ML-% IV SOLN
INTRAVENOUS | Status: AC
  Filled 2020-01-14: qty 250

## 2020-01-14 MED ORDER — HEPARIN (PORCINE) IN NACL 1000-0.9 UT/500ML-% IV SOLN
INTRAVENOUS | Status: DC | PRN
  Administered 2020-01-14 (×2): 500 mL

## 2020-01-14 SURGICAL SUPPLY — 29 items
BALLN STERLING OTW 5X150X150 (BALLOONS) ×3
BALLN STERLING OTW 5X220X150 (BALLOONS) ×3
BALLOON STERLING OTW 5X150X150 (BALLOONS) IMPLANT
BALLOON STERLING OTW 5X220X150 (BALLOONS) IMPLANT
CATH OMNI FLUSH 5F 65CM (CATHETERS) ×3 IMPLANT
CATH QUICKCROSS .035X135CM (MICROCATHETER) ×3 IMPLANT
DCB RANGER 4.0X40 135 (BALLOONS) IMPLANT
DCB RANGER 5.0X200 150 (BALLOONS) IMPLANT
DEVICE CONTINUOUS FLUSH (MISCELLANEOUS) ×1 IMPLANT
DEVICE EMBOSHIELD NAV6 4.0-7.0 (FILTER) ×3 IMPLANT
DIAMONDBACK SOLID OAS 1.5MM (CATHETERS) ×3
GLIDEWIRE ADV .035X260CM (WIRE) ×3 IMPLANT
KIT ENCORE 26 ADVANTAGE (KITS) ×1 IMPLANT
KIT MICROPUNCTURE NIT STIFF (SHEATH) ×3 IMPLANT
KIT PV (KITS) ×3 IMPLANT
LUBRICANT VIPERSLIDE CORONARY (MISCELLANEOUS) ×1 IMPLANT
RANGER DCB 4.0X40 135 (BALLOONS) ×3
RANGER DCB 5.0X200 150 (BALLOONS) ×3
SHEATH FLEX ANSEL ANG 6F 45CM (SHEATH) ×3 IMPLANT
SHEATH PINNACLE 5F 10CM (SHEATH) ×3 IMPLANT
SHEATH PROBE COVER 6X72 (BAG) ×3 IMPLANT
STENT ELUVIA 6X120X130 (Permanent Stent) ×1 IMPLANT
STENT ELUVIA 6X60X130 (Permanent Stent) ×1 IMPLANT
SYR MEDRAD MARK V 150ML (SYRINGE) ×3 IMPLANT
SYSTEM DIMNDBCK SLD OAS 1.5MM (CATHETERS) ×2 IMPLANT
TRANSDUCER W/STOPCOCK (MISCELLANEOUS) ×3 IMPLANT
TRAY PV CATH (CUSTOM PROCEDURE TRAY) ×3 IMPLANT
WIRE HITORQ VERSACORE ST 145CM (WIRE) ×3 IMPLANT
WIRE VIPER ADVANCE .017X335CM (WIRE) ×6 IMPLANT

## 2020-01-14 NOTE — Progress Notes (Signed)
Pt received from cath lab. Left groin w/ some hardness at sheath site. Manual pressure held w/ improvement. CHG complete.VSS. pt oriented to room and unit.  Versie Starks, RN

## 2020-01-14 NOTE — Op Note (Signed)
Patient name: Kayla Schmitt MRN: [id number] DOB: [birth year] Sex: female  01/14/2020 Pre-operative Diagnosis: Critical limb ischemia of the right lower extremity with tissue loss Post-operative diagnosis:  Same Surgeon:  Cephus Shelling, MD Procedure Performed: 1.  Ultrasound-guided access of left common femoral artery 2.  Aortogram including catheter selection of aorta 3.  Bilateral lower extremity arteriogram with selection of third branches in the right lower extremity 4.  Right SFA mechanical orbital atherectomy with distal embolic protection (1.5 mm solid CSI atherectomy device, NAV 6 filter))  5.  Right SFA angioplasty including drug-coated balloon (5 mm Sterling and 5 mm drug-coated Ranger) 6.  Right SFA stent placement (6 mm x 120 mm drug-coated Eluvia and 6 mm x 60 mm drug-coated Eluvia) 7.  Right above-knee popliteal artery angioplasty (4 mm Sterling) 8.  95 minutes monitored moderate conscious sedation time  Indications: Patient is a 54 year old female with diabetes and morbid obesity who presents as a transfer from [facility] for evaluation of right foot ulcer.  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  Risks and benefits discussed.  Findings:   Aortogram showed patent renal arteries bilaterally with patent aortoiliac segment and no flow-limiting stenosis.  On the right she had patent common femoral and profunda with a calcified and diseased long segment SFA with mutli-focal high grade stenosis over 220 mm that was heavily calcified with a short chronic total occlusion and reconstitution of a more robust above-knee popliteal artery.  There was a focal 60 to 70% stenosis in the above knee popliteal artery just above the knee joint.  Below-knee popliteal artery was patent without flow-limiting  stenosis and she had three-vessel runoff.  Dominant runoff into the foot is to the posterior tibial and anterior tibial appears to occlude at the ankle.    Left lower extremity runoff shows a patent common femoral profunda with again diffusely diseased SFA that is very diminutive proximally for long segment and she has a chronic total occlusion with reconstitution of above-knee popliteal artery with three-vessel runoff.  Ultimately the right SFA lesion was crossed and the entire segment was treated with 1.5 mm solid CSI atherectomy device at low medium and high speeds with distal embolic protection in the above-knee popliteal artery.  I then angioplastied this with a 5 mm balloon and then a 5 mm drug-coated balloon.  Unfortunately there were several dissections in the artery and elected to treat these with stents given the dissection did appear to compromise the lumen of the vessel.  I placed two drug-coated Eluvia stents postdilated with a 5 mm balloon.  I then treated the focal above-knee popliteal artery stenosis with a 4 mm angioplasty balloon.  Completion of the case she had preserved three-vessel runoff that was very brisk.   Procedure:  The patient was identified in the holding area and taken to room 8.  The patient was then placed supine on the table and prepped and draped in the usual sterile fashion.  A time out was called.  Ultrasound was used to evaluate the left common femoral artery.  It was patent .  A digital ultrasound image was acquired.  A micropuncture needle was used to access the left common femoral artery under ultrasound guidance.  An 018 wire was advanced without resistance and a micropuncture sheath was placed.  The 018 wire was removed  and a benson wire was placed.  The micropuncture sheath was exchanged for a 5 french sheath.  An omniflush catheter was advanced over the wire to the level of L-1.  An abdominal angiogram was obtained.  Next the catheter was pulled down and bilateral  lower extremity runoff was obtained.  Ultimately after evaluating imaging elected to intervene on the right SFA disease.  Subsequently used a Glidewire advantage and Omni Flush catheter to cannulate the right iliac and we ultimately had a wire into the proximal SFA on the right.  Exchanged for a long 6 [place] Ansell sheath in the left groin over the aortic bifurcation.  Patient was given 100 units as per kilogram heparin.  I then used the quick cross catheter with a Glidewire advantage to cross the SFA into a more patent above-knee popliteal artery.  We confirmed with hand-injection we were in the true lumen.  We then used our quick cross to exchange for an 014 Viper wire placed down the posterior tibial with a tapered tip.  A nav 6 filter was deployed in the popliteal artery behind the knee.  A 1.5 mm CSI solid atherectomy device was chosen and atherectomy was performed of the SFA at low medium and high speeds giving nitroglycerin and Viper slide throughout the case.  We then treated this entire segment with a 5 mm angioplasty balloon in the right SFA and then a 5 mm drug-coated Ranger all the way up to the SFA ostium.  Ultimately there were several dissections and I thought with prolonged inflation of a drug-coated balloon at low pressure this would hopefully resolve.  It did not and given that I felt like the dissections did compromise the lumen diameter I elected to place two drug-coated Olivia stents as noted above in the right SFA that were postdilated with a 5 mm angioplasty balloon.  We then had a widely patent SFA with no residual stenosis although still diseased and calcified.  I then went ahead and treated the above-knee popliteal artery with a 4 mm Sterling for a focal 60 to 70% stenosis at prolonged low inflation pressure.  She had preserved three-vessel runoff at completion of the case and she now has inline flow.  Given fairly small arteries that are heavily diseased I elected not to place a closure  device and she will go to holding to have the left groin sheath removed.  Plan: Patient has in line flow down the right leg now.  Patient will be loaded on Plavix.  Orthopedic surgery will be consulted to evaluate for foot debridement.  Cephus Shelling, MD [facility] [place] Office: [phone]

## 2020-01-14 NOTE — Progress Notes (Signed)
Pharmacy Antibiotic Note  Kayla Schmitt is a 54 y.o. female admitted on 01/11/2020 with cellulitis.  Pharmacy has been consulted for vancomycion dosing.  SCr has bumped from 0.94>1.16, no need to adjust dose currently. Patient is s/p procedure 9/16.  Likely for I&D tomorrow, ortho to see.  Plan: Continue Vancomycin 750 mg IV every 12 hours.  Goal trough 15-20 mcg/mL. Monitor clinical progress, cultures/sensitivities, renal function, abx plan Vancomycin trough as indicated    Height: 5\' 4"  (162.6 cm) Weight: 108.9 kg (240 lb) IBW/kg (Calculated) : 54.7  Temp (24hrs), Avg:98.8 F (37.1 C), Min:97.8 F (36.6 C), Max:100.2 F (37.9 C)  Recent Labs  Lab 01/11/20 1233 01/12/20 0240 01/13/20 0604 01/14/20 0605  WBC 11.4* 7.9 6.8 6.1  CREATININE 1.12* 0.81 0.94 1.16*  LATICACIDVEN 1.7  --   --   --     Estimated Creatinine Clearance: 67.6 mL/min (A) (by C-G formula based on SCr of 1.16 mg/dL (H)).    Allergies  Allergen Reactions  . Doxycycline Nausea And Vomiting    extreme  . Penicillins     Did it involve swelling of the face/tongue/throat, SOB, or low BP? no Did it involve sudden or severe rash/hives, skin peeling, or any reaction on the inside of your mouth or nose? yes Did you need to seek medical attention at a hospital or doctor's office? yes When did it last happen?childhood allergy If all above answers are "NO", may proceed with cephalosporin use.     Antimicrobials this admission: 9/13 vancomycin >>   Microbiology results: 9/13 BCx: ng x 2 days   Thank you for allowing 10/13 to participate in this patients care.   [place], PharmD Please see amion for complete clinical pharmacist phone list. 01/14/2020 3:45 PM

## 2020-01-14 NOTE — Progress Notes (Signed)
58fr left femoral arterial sheath aspirated and removed.  Manual pressure held for , hemostasis achieved.  Site level 0.  Distal pulses dopplered.  Tegaderm and gauze dressing applied, instructions given to pt.   Bedrest begins 15:00 for 4hrs.

## 2020-01-14 NOTE — Progress Notes (Signed)
Dr. Lajoyce Corners to see patient tomorrow am and likely foot debridement tomorrow in OR.  Please keep NPO after midnight.  Cephus Shelling, MD [facility] [place] Office: [phone]   Cephus Shelling

## 2020-01-14 NOTE — Progress Notes (Signed)
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 demonstrated. No evidence of tricuspid stenosis. Aortic Valve: The aortic valve is tricuspid. There is moderate calcification of the aortic valve. There is moderate thickening of the aortic valve. There is moderate aortic valve annular calcification. Aortic valve regurgitation is mild. Mild aortic stenosis is present. Aortic valve mean gradient measures 15.0 mmHg. Aortic valve peak gradient measures 24.6 mmHg. Aortic valve area, by VTI measures 1.57 cm. Pulmonic Valve: The pulmonic valve was not well visualized. Pulmonic valve regurgitation is not visualized. No evidence of pulmonic stenosis. Aorta: The aortic root is normal in size and structure. Pulmonary Artery: Indeterminant PASP, inadequate TR jet. Venous: The inferior vena cava is normal in size with greater than 50% respiratory variability, suggesting right atrial pressure of 3 mmHg. IAS/Shunts: No atrial level shunt detected by color  flow Doppler.  LEFT VENTRICLE PLAX 2D LVIDd:  4.48 cm  Diastology LVIDs:         2.45 cm  LV e' medial:    6.42 cm/s LV PW:         1.29 cm  LV E/e' medial:  20.7 LV IVS:        1.41 cm  LV e' lateral:   7.51 cm/s LVOT diam:     2.10 cm  LV E/e' lateral: 17.7 LV SV:         95 LV SV Index:   45 LVOT Area:     3.46 cm  RIGHT VENTRICLE RV S prime:     14.60 cm/s TAPSE (M-mode): 2.0 cm LEFT ATRIUM             Index       RIGHT ATRIUM           Index LA diam:        4.00 cm 1.89 cm/m  RA Area:     16.90 cm LA Vol (A2C):   75.0 ml 35.48 ml/m RA Volume:   43.80 ml  20.72 ml/m LA Vol (A4C):   60.8 ml 28.76 ml/m LA Biplane Vol: 68.4 ml 32.36 ml/m  AORTIC VALVE AV Area (Vmax):    1.51 cm AV Area (Vmean):   1.44 cm AV Area (VTI):     1.57 cm AV Vmax:           248.00 cm/s AV Vmean:          184.000 cm/s AV VTI:            0.602 m AV Peak Grad:      24.6 mmHg AV Mean Grad:      15.0 mmHg LVOT Vmax:         108.00 cm/s LVOT Vmean:        76.500 cm/s LVOT VTI:          0.273 m LVOT/AV VTI ratio: 0.45  AORTA Ao Root diam: 3.20 cm MITRAL VALVE MV Area (PHT): 3.63 cm     SHUNTS MV Decel Time: 209 msec     Systemic VTI:  0.27 m MV E velocity: 133.00 cm/s  Systemic Diam: 2.10 cm MV A velocity: 141.00 cm/s MV E/A ratio:  0.94 Carlyle Dolly MD Electronically signed by Carlyle Dolly MD Signature Date/Time: 01/12/2020/12:28:04 PM    Final    Scheduled Meds: . atorvastatin  40 mg Oral Daily  . collagenase   Topical Daily  . enoxaparin (LOVENOX) injection  40 mg Subcutaneous Q12H  . gabapentin  200 mg Oral QHS  . insulin aspart  0-20 Units Subcutaneous TID WC  . insulin aspart  0-5 Units Subcutaneous QHS  . insulin aspart  5 Units Subcutaneous TID WC  . insulin glargine  10 Units Subcutaneous QHS  . insulin starter kit- pen needles  1 kit Other Once  . levothyroxine  150 mcg Oral Q0600  . lisinopril  40 mg Oral Daily  . pantoprazole  40 mg Oral Daily   Continuous Infusions: . sodium chloride Stopped  (01/11/20 2250)  . sodium chloride    . vancomycin 750 mg (01/14/20 0636)    LOS: 3 days   Time spent: 30 minutes  Satoya Feeley Wynetta Emery, MD How to contact the [facility] Attending or Consulting provider [place] or covering provider during after hours [place], for this patient?  1. Check the care team in [facility] and look for a) attending/consulting [facility] provider listed and b) the [facility] team listed  2. Log into [email or web address] and use [place]'s universal password to access. If you do not have the password, please contact the hospital operator. 3. Locate the [facility] provider you are looking for under [facility] and page to a number that you can be directly reached. 4. If you still have difficulty reaching the provider, please page the [facility] (Director on Call) for the Hospitalists listed on amion for assistance.  If 7PM-7AM, please contact night-coverage [email or web address] 01/14/2020, 8:51 AM

## 2020-01-14 NOTE — Progress Notes (Signed)
Patient left floor in stable condition via stretcher with [facility] transport. Report given to [facility] staff from night shift RN. Transferred to [facility] to cath lab.

## 2020-01-14 NOTE — Progress Notes (Signed)
Pt w/ nausea and emesis. L groin hematoma larger. Manual pressure held. BP 172/65 (97). 10mg  Labetalol given. Cath lab RN called to assist holding pressure.  , RN

## 2020-01-14 NOTE — Consult Note (Signed)
Vascular and Vein Specialist of [facility]  Patient name: Meliza Kage MRN: [id number] DOB: 1965/11/22 Sex: female   REQUESTING PROVIDER:    Hospitalists   REASON FOR CONSULT:    Diabetic foot ulcer  HISTORY OF PRESENT ILLNESS:   Kayla Schmitt is a 54 y.o. female, who presented to [facility] with a right foot ulcer that has been present for a few weeks. She has been treated with PO antibiotics.  She has a history of a left foot ulcer which has healed.  She was admitted for worsening appearance of her right foot.  CTA shows PAD and MRI shows tendon rupture with abscessShe is a diabetic.  She has previously undergone a left toe amputation  Her diabetes has not been well controlled. She takes a statin for hypercholesterolemia. Her HTN is medically maanged  PAST MEDICAL HISTORY    Past Medical History:  Diagnosis Date  . Diabetes mellitus without complication ([place])   . Hypercholesteremia   . Hypertension   . Thyroid disease      FAMILY HISTORY   Family History  Problem Relation Age of Onset  . Diabetes Other   . CAD Other     SOCIAL HISTORY:   Social History   Socioeconomic History  . Marital status: Divorced    Spouse name: Not on file  . Number of children: Not on file  . Years of education: Not on file  . Highest education level: Not on file  Occupational History  . Not on file  Tobacco Use  . Smoking status: Former Research scientist (life sciences)  . Smokeless tobacco: Never Used  Substance and Sexual Activity  . Alcohol use: Not Currently  . Drug use: Yes    Types: Marijuana    Comment: occ  . Sexual activity: Not on file  Other Topics Concern  . Not on file  Social History Narrative  . Not on file   Social Determinants of Health   Financial Resource Strain:   . Difficulty of Paying Living Expenses: Not on file  Food Insecurity:   . Worried About Charity fundraiser in the Last Year: Not on file  . Ran Out of Food in the Last Year:  Not on file  Transportation Needs:   . Lack of Transportation (Medical): Not on file  . Lack of Transportation (Non-Medical): Not on file  Physical Activity:   . Days of Exercise per Week: Not on file  . Minutes of Exercise per Session: Not on file  Stress:   . Feeling of Stress : Not on file  Social Connections:   . Frequency of Communication with Friends and Family: Not on file  . Frequency of Social Gatherings with Friends and Family: Not on file  . Attends Religious Services: Not on file  . Active Member of Clubs or Organizations: Not on file  . Attends Archivist Meetings: Not on file  . Marital Status: Not on file  Intimate Partner Violence:   . Fear of Current or Ex-Partner: Not on file  . Emotionally Abused: Not on file  . Physically Abused: Not on file  . Sexually Abused: Not on file    ALLERGIES:    Allergies  Allergen Reactions  . Doxycycline Nausea And Vomiting    extreme  . Penicillins     Did it involve swelling of the face/tongue/throat, SOB, or low BP? no Did it involve sudden or severe rash/hives, skin peeling, or any reaction on the inside of your mouth or nose?  yes Did you need to seek medical attention at a hospital or doctor's office? yes When did it last happen?childhood allergy If all above answers are "NO", may proceed with cephalosporin use.     CURRENT MEDICATIONS:    Current Facility-Administered Medications  Medication Dose Route Frequency Provider Last Rate Last Admin  . 0.9 %  sodium chloride infusion   Intravenous Continuous Oswald Hillock, MD   Held at 01/11/20 2250  . 0.9 %  sodium chloride infusion   Intravenous Once Marty Heck, MD      . Doug Sou Hold] acetaminophen (TYLENOL) tablet 1,000 mg  1,000 mg Oral Q6H PRN Oswald Hillock, MD   1,000 mg at 01/13/20 2137  . [MAR Hold] atorvastatin (LIPITOR) tablet 40 mg  40 mg Oral Daily Oswald Hillock, MD   40 mg at 01/13/20 0948  . [MAR Hold] collagenase (SANTYL) ointment    Topical Daily Oswald Hillock, MD   Given at 01/13/20 [phone]  . [MAR Hold] enoxaparin (LOVENOX) injection 40 mg  40 mg Subcutaneous Q12H Johnson, Clanford L, MD      . Doug Sou Hold] gabapentin (NEURONTIN) capsule 200 mg  200 mg Oral QHS Oswald Hillock, MD   200 mg at 01/13/20 2134  . [MAR Hold] hydrALAZINE (APRESOLINE) tablet 25 mg  25 mg Oral Q6H PRN Oswald Hillock, MD      . Doug Sou Hold] insulin aspart (novoLOG) injection 0-20 Units  0-20 Units Subcutaneous TID WC Manuella Ghazi, Pratik D, DO   4 Units at 01/13/20 1759  . [MAR Hold] insulin aspart (novoLOG) injection 0-5 Units  0-5 Units Subcutaneous QHS Heath Lark D, DO   2 Units at 01/12/20 2104  . [MAR Hold] insulin aspart (novoLOG) injection 5 Units  5 Units Subcutaneous TID WC Johnson, Clanford L, MD   5 Units at 01/13/20 1759  . [MAR Hold] insulin glargine (LANTUS) injection 10 Units  10 Units Subcutaneous QHS Oswald Hillock, MD   10 Units at 01/13/20 2134  . [MAR Hold] insulin starter kit- pen needles (English) 1 kit  1 kit Other Once [place], Marge Duncans, MD      . Doug Sou Hold] levothyroxine (SYNTHROID) tablet 150 mcg  150 mcg Oral Q0600 Oswald Hillock, MD   150 mcg at 01/13/20 0427  . [MAR Hold] lisinopril (ZESTRIL) tablet 40 mg  40 mg Oral Daily Oswald Hillock, MD   40 mg at 01/13/20 0948  . [MAR Hold] ondansetron (ZOFRAN) tablet 4 mg  4 mg Oral Q6H PRN Oswald Hillock, MD       Or  . Doug Sou Hold] ondansetron (ZOFRAN) injection 4 mg  4 mg Intravenous Q6H PRN Oswald Hillock, MD      . Doug Sou Hold] pantoprazole (PROTONIX) EC tablet 40 mg  40 mg Oral Daily Oswald Hillock, MD   40 mg at 01/13/20 0947  . [MAR Hold] vancomycin (VANCOREADY) IVPB 750 mg/150 mL  750 mg Intravenous Q12H Coffee, Donna Christen, RPH 150 mL/hr at 01/14/20 0636 750 mg at 01/14/20 0636    REVIEW OF SYSTEMS:   '[X]'  denotes positive finding, '[ ]'  denotes negative finding Cardiac  Comments:  Chest pain or chest pressure:    Shortness of breath upon exertion:    Short of breath when lying flat:    Irregular  heart rhythm:        Vascular    Pain in calf, thigh, or hip brought on by ambulation:    Pain in feet  at night that wakes you up from your sleep:     Blood clot in your veins:    Leg swelling:         Pulmonary    Oxygen at home:    Productive cough:     Wheezing:         Neurologic    Sudden weakness in arms or legs:     Sudden numbness in arms or legs:     Sudden onset of difficulty speaking or slurred speech:    Temporary loss of vision in one eye:     Problems with dizziness:         Gastrointestinal    Blood in stool:      Vomited blood:         Genitourinary    Burning when urinating:     Blood in urine:        Psychiatric    Major depression:         Hematologic    Bleeding problems:    Problems with blood clotting too easily:        Skin    Rashes or ulcers: x       Constitutional    Fever or chills:     PHYSICAL EXAM:   Vitals:   01/13/20 1345 01/13/20 1949 01/13/20 2119 01/14/20 0505  BP: (!) 157/68  (!) 160/83 (!) 167/62  Pulse: 77  82 84  Resp: '18  20 20  ' Temp: 98.9 F (37.2 C)  100.2 F (37.9 C) 98.5 F (36.9 C)  TempSrc: Oral  Oral   SpO2: 97% 97% 98% 99%  Weight:      Height:        GENERAL: The patient is a well-nourished female, in no acute distress. The vital signs are documented above. CARDIAC: There is a regular rate and rhythm.  VASCULAR: palpable femoral pulses PULMONARY: Nonlabored respirations ABDOMEN: Soft and non-tender with normal pitched bowel sounds.  MUSCULOSKELETAL: There are no major deformities or cyanosis. NEUROLOGIC: No focal weakness or paresthesias are detected. SKIN: see photo below PSYCHIATRIC: The patient has a normal affect.    STUDIES:   I have reviewed her CTA with the following results:  Multilevel PA D, including:  -moderate aortic atherosclerosis without aneurysm or high-grade stenosis. Aortic Atherosclerosis (ICD10-I70.0).  -moderate right iliac arterial disease, without high-grade  stenosis by CT.  -advanced right femoropopliteal disease, including partially calcified plaque involving the common femoral artery extending into the proximal SFA. There is a favored SFA occlusion secondary to calcified plaque within the adductor canal.  -mild right tibial arterial disease.  -moderate left iliac arterial disease without high-grade stenosis or occlusion.  -advanced left femoropopliteal disease, including occlusion of the SFA within the adductor canal.  -mild left tibial arterial disease.  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  Mild bilateral renal arterial disease.   ABI: Right ABI:  0.91  Left ABI:  0.82  ASSESSMENT and PLAN   Diabetic right foot ulcer:  I discussed that this is a limb threatening situation.  We will proceed with angiography today by Dr. Carlis Abbott.  We discussed the details of the procedure as well as the risks and benefits. All of her questions were answered.  We also discussed getting ortho involved for management of the foot.   Leia Alf, MD, FACS [facility] Specialists  of [facility] [phone] Pager [phone]

## 2020-01-15 ENCOUNTER — Encounter (HOSPITAL_COMMUNITY): Payer: Self-pay | Admitting: Vascular Surgery

## 2020-01-15 LAB — COMPREHENSIVE METABOLIC PANEL
ALT: 20 U/L (ref 0–44)
AST: 14 U/L — ABNORMAL LOW (ref 15–41)
Albumin: 2.7 g/dL — ABNORMAL LOW (ref 3.5–5.0)
Alkaline Phosphatase: 114 U/L (ref 38–126)
Anion gap: 12 (ref 5–15)
BUN: 19 mg/dL (ref 6–20)
CO2: 18 mmol/L — ABNORMAL LOW (ref 22–32)
Calcium: 8.7 mg/dL — ABNORMAL LOW (ref 8.9–10.3)
Chloride: 107 mmol/L (ref 98–111)
Creatinine, Ser: 1.09 mg/dL — ABNORMAL HIGH (ref 0.44–1.00)
GFR calc Af Amer: >60 mL/min (ref >60)
GFR calc non Af Amer: 58 mL/min — ABNORMAL LOW (ref >60)
Glucose, Bld: 181 mg/dL — ABNORMAL HIGH (ref 70–99)
Potassium: 3.9 mmol/L (ref 3.5–5.1)
Sodium: 137 mmol/L (ref 135–145)
Total Bilirubin: 0.6 mg/dL (ref 0.3–1.2)
Total Protein: 6.6 g/dL (ref 6.5–8.1)

## 2020-01-15 LAB — CBC
HCT: 28 % — ABNORMAL LOW (ref 36.0–46.0)
Hemoglobin: 8.6 g/dL — ABNORMAL LOW (ref 12.0–15.0)
MCH: 27.8 pg (ref 26.0–34.0)
MCHC: 30.7 g/dL (ref 30.0–36.0)
MCV: 90.6 fL (ref 80.0–100.0)
Platelets: 269 10*3/uL (ref 150–400)
RBC: 3.09 MIL/uL — ABNORMAL LOW (ref 3.87–5.11)
RDW: 13.2 % (ref 11.5–15.5)
WBC: 7.7 10*3/uL (ref 4.0–10.5)
nRBC: 0 % (ref 0.0–0.2)

## 2020-01-15 LAB — POCT ACTIVATED CLOTTING TIME
Activated Clotting Time: 202 seconds
Activated Clotting Time: 186 seconds

## 2020-01-15 LAB — GLUCOSE, CAPILLARY
Glucose-Capillary: 157 mg/dL — ABNORMAL HIGH (ref 70–99)
Glucose-Capillary: 182 mg/dL — ABNORMAL HIGH (ref 70–99)

## 2020-01-15 MED ORDER — INSULIN GLARGINE 100 UNIT/ML ~~LOC~~ SOLN
12.0000 [IU] | Freq: Every day | SUBCUTANEOUS | Status: DC
  Filled 2020-01-15: qty 0.12

## 2020-01-15 MED ORDER — SULFAMETHOXAZOLE-TRIMETHOPRIM 800-160 MG PO TABS: 1.0000 | ORAL_TABLET | Freq: Two times a day (BID) | ORAL | 0 refills | Status: DC

## 2020-01-15 MED ORDER — CLOPIDOGREL BISULFATE 75 MG PO TABS
75.0000 mg | ORAL_TABLET | Freq: Every day | ORAL | 3 refills | Status: DC
Start: 2020-01-16 — End: 2021-02-07

## 2020-01-15 MED ORDER — COLLAGENASE 250 UNIT/GM EX OINT: TOPICAL_OINTMENT | Freq: Every day | CUTANEOUS | 2 refills | Status: DC

## 2020-01-15 MED ORDER — COLLAGENASE 250 UNIT/GM EX OINT
TOPICAL_OINTMENT | Freq: Every day | CUTANEOUS | Status: DC
  Filled 2020-01-15: qty 30

## 2020-01-15 MED ORDER — INSULIN STARTER KIT- PEN NEEDLES (ENGLISH): 1.0000 | Freq: Once | 0 refills | Status: AC

## 2020-01-15 MED ORDER — TRESIBA FLEXTOUCH 100 UNIT/ML ~~LOC~~ SOPN: 10.0000 [IU] | PEN_INJECTOR | Freq: Every day | SUBCUTANEOUS | 3 refills | Status: DC

## 2020-01-15 NOTE — Discharge Summary (Signed)
Physician Discharge Summary  Kayla Schmitt TSV:[id number] DOB: 06-May-1965 DOA: 01/11/2020  PCP: [facility]  Admit date: 01/11/2020 Discharge date: 01/15/2020  Admitted From: Home  Disposition:  Home   Recommendations for Outpatient Follow-up:  1. Follow up with Kayla Schmitt in 1 week 2. Follow up with Kayla Schmitt PCP in 1-2 weeks 3. Follow up with Vascular surgery in 1 month 4. Kayla Schmitt: Please consider reattempt at SGLT-2 inhibitor Schmitt DM and vascular disease vs Trulicity and review glucoses on new Lantus 5. Kayla Schmitt: Please obtain BMP/CBC in one week       Home Health: None  Equipment/Devices: None  Discharge Condition: Fair  CODE STATUS: FULL Diet recommendation: Diabetic Cardiac  Brief/Interim Summary: Kayla Schmitt is a 54 y.o. F with hx morbid obesity, DM, HTN who presented with fever for 2 days and worsening foot pain and redness.       PRINCIPAL HOSPITAL DIAGNOSIS: Critical limb ischemia    Discharge Diagnoses:   Critical limb ischemia and Peripheral Arterial Occlusion w Atherosclerosis, POA S/p right femoral stent PAD Due to or associated with  Diabetes Mellitus type 2 each other Possible cellulitis Patient presented with redness and pain in the right ABIs were performed that showed diminished arterial flow on the right.  Vascular surgery were consulted Dr. The patient angiogram and placed a stent. -Started on aspirin and Plavix -Statin continued -Follow-up in vascular surgery clinic in 1 month for new stent   After revascularization, her right foot appeared viable.  She will have close follow up with Orthopedics but discharge home with Santyl, Bactrim for 4 weeks.   Diabetes A1c 11%.  Poor control.  Has a history of reacting to [place].  Metformin continued in 2 days.  Recommend attempt GLP-1 agonist versus trial [place].  Lantus started.  Recommended close PCP follow up.    Morbid obesity BMI 41 and history diabetes Protein  calorie mlanutrition ruled out Hypertension      Discharge Instructions  Discharge Instructions    Diet - low sodium heart healthy   Complete by: As directed    Discharge instructions   Complete by: As directed    From Kayla Schmitt: You were admitted for a critical blockage of the artery to your foot. This was opened by Kayla Schmitt with a stent Kayla Schmitt the foot surgeon reviewed the plan for debridement, and we all agree this may heal with medical treatment now that the blockage is treated/open.  For the new stent and the artery disease in your leg: Take aspirin 81 mg daily from now on Continue your atorvastatin 40 mg nightly this is important TAKE clopidogrel/Plavix 75 mg once daily Follow up with Kayla Schmitt in 1 month  Kayla Schmitt can tell you how long to continue Plavix   For the infection:  Change your dressing daily Apply santyl and cover Go see Kayla Schmitt in 1 week (call his offfice for an appointment) Take trimethoprim-sulfamethoxazole/Bactrim 1 tab twice daily until Kayla Schmitt tells you to stop    For your diabetes: Avoid carbohydrates Resume your metformin IN 2 DAYS (on Sunday) Start insulin Use the long-acting insulin Tresiba (insulin degludec) 10 units at night daily Check your MORNING sugar before eating This fast morning sugar should be in the 90-120 range   Go see Kayla Schmitt asap and discuss alternatives like Trulicity or [place]   Discharge wound care:   Complete by: As directed    Change dressing daily Apply santyl to wound then re-wrap  Increase activity slowly   Complete by: As directed      Allergies as of 01/15/2020      Reactions   Doxycycline Nausea And Vomiting   extreme   Penicillins    Did it involve swelling of the face/tongue/throat, SOB, or low BP? no Did it involve sudden or severe rash/hives, skin peeling, or any reaction on the inside of your mouth or nose? yes Did you need to seek medical attention at a hospital or doctor's office?  yes When did it last happen?childhood allergy If all above answers are "NO", may proceed with cephalosporin use.      Medication List    STOP taking these medications   cephALEXin 500 MG capsule Commonly known as: KEFLEX     TAKE these medications   acetaminophen 500 MG tablet Commonly known as: TYLENOL Take 1,000 mg by mouth every 6 (six) hours as needed for moderate pain, fever or headache.   aspirin 81 MG EC tablet Take 1 tablet by mouth daily.   atorvastatin 40 MG tablet Commonly known as: LIPITOR Take 40 mg by mouth daily.   clopidogrel 75 MG tablet Commonly known as: PLAVIX Take 1 tablet (75 mg total) by mouth daily with breakfast. Start taking on: January 16, 2020   collagenase ointment Commonly known as: SANTYL Apply topically daily. Start taking on: January 16, 2020   gabapentin 100 MG capsule Commonly known as: NEURONTIN Take 200 mg by mouth at bedtime.   insulin starter kit- pen needles Misc 1 kit by Other route once for 1 dose.   levothyroxine 150 MCG tablet Commonly known as: SYNTHROID Take 150 mcg by mouth daily.   lisinopril-hydrochlorothiazide 20-25 MG tablet Commonly known as: ZESTORETIC Take 2 tablets by mouth every morning.   metFORMIN 500 MG 24 hr tablet Commonly known as: GLUCOPHAGE-XR Take 1,000 mg by mouth in the morning and at bedtime.   multivitamin with minerals Tabs tablet Take 1 tablet by mouth daily.   pantoprazole 40 MG tablet Commonly known as: PROTONIX Take 40 mg by mouth daily.   sulfamethoxazole-trimethoprim 800-160 MG tablet Commonly known as: Bactrim DS Take 1 tablet by mouth 2 (two) times daily.   [name] 100 UNIT/ML FlexTouch Pen Generic drug: insulin degludec Inject 10 Units into the skin at bedtime.            Discharge Care Instructions  (From admission, onward)         Start     Ordered   01/15/20 0000  Discharge wound care:       Comments: Change dressing daily Apply santyl  to wound then re-wrap   01/15/20 1324          Follow-up Information    Kayla Schmitt Follow up in 1 week(s).   Specialty: Orthopedic Surgery Contact information: [place] [address] [phone]        Kayla Schmitt. Schedule an appointment as soon as possible for a visit in 1 month(s).   Specialty: Vascular Surgery Contact information: [place] [address] [phone]        Medicine, [facility]. Schedule an appointment as soon as possible for a visit in 1 week(s).   Specialty: Internal Medicine Contact information: [place] [address] [phone]              Allergies  Allergen Reactions  . Doxycycline Nausea And Vomiting    extreme  . Penicillins  Did it involve swelling of the face/tongue/throat, SOB, or low BP? no Did it involve sudden or severe rash/hives, skin peeling, or any reaction on the inside of your mouth or nose? yes Did you need to seek medical attention at a hospital or doctor's office? yes When did it last happen?childhood allergy If all above answers are "NO", may proceed with cephalosporin use.     Consultations:  Vascular surgery  Orthopedics   Procedures/Studies: CT ANGIO AO+BIFEM W & OR WO CONTRAST  Result Date: 01/13/2020 CLINICAL DATA:  54 year old female with a history of claudication/foot ulcer with diabetes EXAM: CT ANGIOGRAPHY OF ABDOMINAL AORTA WITH ILIOFEMORAL RUNOFF TECHNIQUE: Multidetector CT imaging of the abdomen, pelvis and lower extremities was performed using the standard protocol during bolus administration of intravenous contrast. Multiplanar CT image reconstructions and MIPs were obtained to evaluate the vascular anatomy. CONTRAST:  160m OMNIPAQUE IOHEXOL 350 MG/ML SOLN COMPARISON:  Noninvasive 01/12/2020 FINDINGS: VASCULAR Aorta: Moderate atherosclerotic changes with calcified and soft plaque of the abdominal aorta. No dissection. No  aneurysm. No periaortic inflammatory changes. Celiac: Atherosclerotic changes at the origin of the celiac artery. There appears to be 50% narrowing at the origin Schmitt the calcified plaque. SMA: Mixed calcified and soft plaque at the origin of the SMA. SMA remains patent, however, there is greater than 50% narrowing estimated on the axial and parasagittal images. Renals: - Right: Single right renal artery. Calcifications at the origin of the right renal artery without evidence of high-grade stenosis. - Left: Single left renal artery. Atherosclerotic changes at the origin of the left renal artery without evidence of high-grade stenosis. IMA: IMA is patent with atherosclerotic changes at the origin. Right lower extremity: Diffuse atherosclerotic changes through the right iliac system, moderate to advanced, with predominantly calcified features. No aneurysm or dissection. The greatest degree of atherosclerosis involves the origin of the common iliac artery and the proximal external iliac artery. Hypogastric artery is patent with atherosclerotic changes at the origin. Moderate atherosclerotic changes of the common femoral artery, predominantly on the posterior wall calcifications extend through the common femoral artery into the proximal superficial femoral artery where there is likely high-grade stenosis at the SFA origin. Atherosclerotic changes at the origin of the profunda femoris, with patency of the thigh branches. Advanced atherosclerotic changes of the superficial femoral artery with at least multifocal stenoses secondary to calcified plaque. Calcified plaque within the abductor canal limits evaluation for short segment occlusion. Atherosclerotic changes extend through the popliteal artery, which appears patent. Anterior tibial artery is patent at the origin with minimal calcifications proximally and appears patent to the ankle. Tibioperoneal trunk with moderate calcifications, appears patent. Peroneal artery is  patent from the origin to the ankle. Posterior tibial artery is patent from the origin to the ankle with mild atherosclerotic calcifications. Left lower extremity: Diffuse atherosclerotic changes of the left iliac system, moderate to advanced severity. No aneurysm or dissection. No high-grade stenosis of the common iliac artery. No high-grade stenosis of the external iliac artery. Hypogastric artery remains patent. Moderate atherosclerotic changes of the common femoral artery with predominantly calcifications on the posterior wall. Profunda femoris remains patent with atherosclerotic changes at the origin. Thigh branches patent. Advanced atherosclerotic changes of the left SFA, with multifocal high-grade stenoses proximally and occlusion within the abductor canal. Reconstitution of the popliteal artery with moderate atherosclerotic changes. Calcifications at the origin of the anterior tibial artery, limiting evaluation for stenosis/occlusion. The distal anterior tibial artery remains patent. Tibioperoneal trunk with moderate atherosclerotic changes, remains  patent. Calcifications at the origin of the peroneal artery, which appears patent distally. Posterior tibial artery with atherosclerotic changes at the origin, patent in the mid segment and distally. Veins: Unremarkable appearance of the venous system. Review of the MIP images confirms the above findings. NON-VASCULAR Lower chest: No acute. Hepatobiliary: Diffusely decreased attenuation of liver parenchyma. Gallbladder relatively decompressed without stones in the lumen. Pancreas: Unremarkable. Spleen: The span of the spleen on the axial images is greater than 14 cm. Adrenals/Urinary Tract: - Right adrenal gland: 10 mm nodule in the body of the right adrenal gland. Hounsfield units measure 44 on this single phase imaging. - Left adrenal gland: Unremarkable. - Right kidney: No hydronephrosis, nephrolithiasis, inflammation, or ureteral dilation. No focal lesion. -  Left Kidney: No hydronephrosis, nephrolithiasis, inflammation, or ureteral dilation. No focal lesion. - Urinary Bladder: Unremarkable. Stomach/Bowel: - Stomach: Hiatal hernia with otherwise unremarkable stomach. - Small bowel: Unremarkable - Appendix: Normal. - Colon: Unremarkable. Lymphatic: No mesenteric or retroperitoneal adenopathy. Lymph nodes present within the bilateral inguinal region, not enlarged and potentially reactive. Mesenteric: No free fluid or air. No mesenteric adenopathy. Reproductive: Unremarkable appearance of the pelvic organs. Other: No hernia. Musculoskeletal: No acute displaced fracture. Degenerative changes of the lower thoracic/lumbar spine including vacuum disc phenomenon at T11-T12 and L5-S1. No significant bony canal narrowing. Degenerative changes bilateral hips. Degenerative changes bilateral knees. Soft tissue defect within the dorsum of the right foot overlying the interspace of the first and second metatarsal, compatible with the Schmitt history. Mild edema within the superficial tissues of the right leg compared to the left. IMPRESSION: Multilevel PA D, including: -moderate aortic atherosclerosis without aneurysm or high-grade stenosis. Aortic Atherosclerosis (ICD10-I70.0). -moderate right iliac arterial disease, without high-grade stenosis by CT. -advanced right femoropopliteal disease, including partially calcified plaque involving the common femoral artery extending into the proximal SFA. There is a favored SFA occlusion secondary to calcified plaque within the adductor canal. -mild right tibial arterial disease. -moderate left iliac arterial disease without high-grade stenosis or occlusion. -advanced left femoropopliteal disease, including occlusion of the SFA within the adductor canal. -mild left tibial arterial disease. 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 Mild bilateral renal arterial disease. Steatosis. Splenomegaly. 10 mm lesion within the right adrenal gland. While this is most likely adrenal adenoma, the current CT is nondiagnostic. If indicated/warranted, diagnostic imaging to consider would be adrenal protocol MRI, or adrenal protocol CT. Additional ancillary findings as above. Signed, Dulcy Fanny. Dellia Nims, RPVI [facility] Radiology Electronically Signed   By: Corrie Mckusick D.O.   On: 01/13/2020 09:25   MR FOOT RIGHT WO CONTRAST  Result Date: 01/12/2020 CLINICAL DATA:  Dorsal foot wound.  Fever.  Diabetes. EXAM: MRI OF THE RIGHT FOREFOOT WITHOUT CONTRAST TECHNIQUE: Multiplanar, multisequence MR imaging of the right forefoot was performed. No intravenous contrast was administered. COMPARISON:  Right foot x-rays from yesterday. FINDINGS: Bones/Joint/Cartilage No marrow signal abnormality. No fracture or dislocation. Normal alignment. Small first MTP joint effusion. Small amount of fluid in the first and third intermetatarsal bursae. Ligaments Collateral ligaments are intact.  Lisfranc ligament is intact. Muscles and Tendons Short segment complete tear of the extensor hallucis longus tendon with 1.2 cm retraction (series 7, image 23). Increased T2 signal within the intrinsic muscles of the forefoot, nonspecific, but likely related to diabetic muscle changes. Soft tissue Dorsal skin ulceration  along the medial forefoot overlying the distal first and second metatarsals. Mild soft tissue swelling of the dorsal forefoot and toes. No fluid collection or hematoma. No soft tissue mass. IMPRESSION: 1. Dorsal skin ulceration along the medial forefoot overlying the distal first and second metatarsals. No abscess or osteomyelitis. 2. Short segment complete tear of the extensor hallucis longus tendon deep to the ulcer with 1.2 cm retraction. Electronically Signed   By: Titus Dubin M.D.   On:  01/12/2020 10:14   PERIPHERAL VASCULAR CATHETERIZATION  Result Date: 01/14/2020 Patient name: Kayla Schmitt            MRN: [id number]        DOB: 1965/07/31            Sex: female  01/14/2020 Pre-operative Diagnosis: Critical limb ischemia of the right lower extremity with tissue loss Post-operative diagnosis:  Same Surgeon:  Kayla Schmitt Procedure Performed: 1.  Ultrasound-guided access of left common femoral artery 2.  Aortogram including catheter selection of aorta 3.  Bilateral lower extremity arteriogram with selection of third branches in the right lower extremity 4.  Right SFA mechanical orbital atherectomy with distal embolic protection (1.5 mm solid CSI atherectomy device, NAV 6 filter)) 5.  Right SFA angioplasty including drug-coated balloon (5 mm Sterling and 5 mm drug-coated Ranger) 6.  Right SFA stent placement (6 mm x 120 mm drug-coated Eluvia and 6 mm x 60 mm drug-coated Eluvia) 7.  Right above-knee popliteal artery angioplasty (4 mm Sterling) 8.  95 minutes monitored moderate conscious sedation time  Indications: Patient is a 54 year old female with diabetes and morbid obesity who presents as a transfer from [facility] for evaluation of right foot ulcer.  She had a CT scan at [facility] that shows multilevel occlusive disease including SFA and popliteal disease and presents for planned right lower extremity arteriogram and possible intervention for attempted limb salvage.  Risks and benefits discussed.  Findings:  Aortogram showed patent renal arteries bilaterally with patent aortoiliac segment and no flow-limiting stenosis.  On the right she had patent common femoral and profunda with a calcified and diseased long segment SFA with mutli-focal high grade stenosis over 220 mm that was heavily calcified with a short chronic total occlusion and reconstitution of a more robust above-knee popliteal artery.  There was a focal 60 to 70% stenosis in the above knee popliteal artery just  above the knee joint.  Below-knee popliteal artery was patent without flow-limiting stenosis and she had three-vessel runoff.  Dominant runoff into the foot is to the posterior tibial and anterior tibial appears to occlude at the ankle.   Left lower extremity runoff shows a patent common femoral profunda with again diffusely diseased SFA that is very diminutive proximally for long segment and she has a chronic total occlusion with reconstitution of above-knee popliteal artery with three-vessel runoff.  Ultimately the right SFA lesion was crossed and the entire segment was treated with 1.5 mm solid CSI atherectomy device at low medium and high speeds with distal embolic protection in the above-knee popliteal artery.  I then angioplastied this with a 5 mm balloon and then a 5 mm drug-coated balloon.  Unfortunately there were several dissections in the artery and elected to treat these with stents Schmitt the dissection did appear to compromise the lumen of the vessel.  I placed two drug-coated Eluvia stents postdilated with a 5 mm balloon.  I then treated the focal above-knee popliteal artery stenosis with a 4 mm  angioplasty balloon.  Completion of the case she had preserved three-vessel runoff that was very brisk.             Procedure:  The patient was identified in the holding area and taken to room 8.  The patient was then placed supine on the table and prepped and draped in the usual sterile fashion.  A time out was called.  Ultrasound was used to evaluate the left common femoral artery.  It was patent .  A digital ultrasound image was acquired.  A micropuncture needle was used to access the left common femoral artery under ultrasound guidance.  An 018 wire was advanced without resistance and a micropuncture sheath was placed.  The 018 wire was removed and a benson wire was placed.  The micropuncture sheath was exchanged for a 5 french sheath.  An omniflush catheter was advanced over the wire to the level of L-1.   An abdominal angiogram was obtained.  Next the catheter was pulled down and bilateral lower extremity runoff was obtained.  Ultimately after evaluating imaging elected to intervene on the right SFA disease.  Subsequently used a Glidewire advantage and Omni Flush catheter to cannulate the right iliac and we ultimately had a wire into the proximal SFA on the right.  Exchanged for a long 6 [place] Ansell sheath in the left groin over the aortic bifurcation.  Patient was Schmitt 100 units as per kilogram heparin.  I then used the quick cross catheter with a Glidewire advantage to cross the SFA into a more patent above-knee popliteal artery.  We confirmed with hand-injection we were in the true lumen.  We then used our quick cross to exchange for an 014 Viper wire placed down the posterior tibial with a tapered tip.  A nav 6 filter was deployed in the popliteal artery behind the knee.  A 1.5 mm CSI solid atherectomy device was chosen and atherectomy was performed of the SFA at low medium and high speeds giving nitroglycerin and Viper slide throughout the case.  We then treated this entire segment with a 5 mm angioplasty balloon in the right SFA and then a 5 mm drug-coated Ranger all the way up to the SFA ostium.  Ultimately there were several dissections and I thought with prolonged inflation of a drug-coated balloon at low pressure this would hopefully resolve.  It did not and Schmitt that I felt like the dissections did compromise the lumen diameter I elected to place two drug-coated Olivia stents as noted above in the right SFA that were postdilated with a 5 mm angioplasty balloon.  We then had a widely patent SFA with no residual stenosis although still diseased and calcified.  I then went ahead and treated the above-knee popliteal artery with a 4 mm Sterling for a focal 60 to 70% stenosis at prolonged low inflation pressure.  She had preserved three-vessel runoff at completion of the case and she now has inline flow.   Schmitt fairly small arteries that are heavily diseased I elected not to place a closure device and she will go to holding to have the left groin sheath removed.  Plan: Patient has in line flow down the right leg now.  Patient will be loaded on Plavix.  Orthopedic surgery will be consulted to evaluate for foot debridement.  Kayla Schmitt [facility] [place] Office: [phone]  US ARTERIAL ABI (SCREENING LOWER EXTREMITY)  Result Date: 01/12/2020 CLINICAL DATA:  54 year old female with history of  right foot ulcer. EXAM: NONINVASIVE PHYSIOLOGIC VASCULAR STUDY OF BILATERAL LOWER EXTREMITIES TECHNIQUE: Evaluation of both lower extremities were performed at rest, including calculation of ankle-brachial indices with single level Doppler, pressure and pulse volume recording. COMPARISON:  None. FINDINGS: Right ABI:  0.91 Left ABI:  0.82 Right Lower Extremity:  Monophasic waveforms at the ankle. Left Lower Extremity:  Monophasic waveforms at the ankle. 0.8-0.89 Mild PAD IMPRESSION: Borderline low right ABI (0.91) and mildly decreased left ABI (0.82) with monophasic waveforms at the level of the ankle bilaterally. These ABIs could be falsely elevated as the waveforms are discordant, likely secondary to limited vessel compressibility in the setting of diabetes. Consider CTA runoff or lower extremity angiogram as clinically indicated. Electronically Signed   By: Ruthann Cancer Schmitt   On: 01/12/2020 10:28   DG Foot Complete Right  Result Date: 01/11/2020 CLINICAL DATA:  Foot wound dorsally EXAM: RIGHT FOOT COMPLETE - 3+ VIEW COMPARISON:  None. FINDINGS: Negative for fracture or osteomyelitis Soft tissue swelling and wound in the dorsum of the metatarsal region. No arthropathy. Calcaneal spurring.  Arterial calcification. IMPRESSION: No acute skeletal abnormality. Electronically Signed   By: Franchot Gallo M.D.   On: 01/11/2020 14:03   ECHOCARDIOGRAM COMPLETE  Result Date: 01/12/2020     ECHOCARDIOGRAM REPORT   Patient Name:   [facility] Date of Exam: 01/12/2020 Medical Rec #:  [id number]      Height:       64.0 in Accession #:    [id number]     Weight:       240.0 lb Date of Birth:  23-Jul-1965       BSA:          2.114 m Patient Age:    14 years       BP:           172/81 mmHg Patient Gender: F              HR:           80 bpm. Exam Location:  [facility] Procedure: 2D Echo, Cardiac Doppler and Color Doppler Indications:    Murmur 785.2 / R01.1  History:        Patient has no prior history of Echocardiogram examinations.                 Risk Factors:Hypertension, Diabetes and Dyslipidemia.  Sonographer:    Alvino Chapel RCS Referring Phys: [place]  1. Left ventricular ejection fraction, by estimation, is 65 to 70%. The left ventricle has normal function. The left ventricle has no regional wall motion abnormalities. There is moderate left ventricular hypertrophy. Left ventricular diastolic parameters are consistent with Grade I diastolic dysfunction (impaired relaxation). Elevated left atrial pressure.  2. Right ventricular systolic function is normal. The right ventricular size is normal.  3. The mitral valve is normal in structure. No evidence of mitral valve regurgitation. No evidence of mitral stenosis.  4. The aortic valve is tricuspid. There is moderate calcification of the aortic valve. There is moderate thickening of the aortic valve. Aortic valve regurgitation is mild. Mild aortic valve stenosis. Aortic valve mean gradient measures 15.0 mmHg. Aortic valve peak gradient measures 24.6 mmHg. Aortic valve area, by VTI measures 1.57 cm.  5. The inferior vena cava is normal in size with greater than 50% respiratory variability, suggesting right atrial pressure of 3 mmHg. FINDINGS  Left Ventricle: Left ventricular ejection fraction, by estimation, is 65 to 70%. The left  ventricle has normal function. The left ventricle has no regional wall motion abnormalities. The left  ventricular internal cavity size was normal in size. There is  moderate left ventricular hypertrophy. Left ventricular diastolic parameters are consistent with Grade I diastolic dysfunction (impaired relaxation). Elevated left atrial pressure. Right Ventricle: The right ventricular size is normal. No increase in right ventricular wall thickness. Right ventricular systolic function is normal. Left Atrium: Left atrial size was normal in size. Right Atrium: Right atrial size was normal in size. Pericardium: There is no evidence of pericardial effusion. Mitral Valve: The mitral valve is normal in structure. There is mild thickening of the mitral valve leaflet(s). There is mild calcification of the mitral valve leaflet(s). Mild mitral annular calcification. No evidence of mitral valve regurgitation. No evidence of mitral valve stenosis. Tricuspid Valve: The tricuspid valve is normal in structure. Tricuspid valve regurgitation is not demonstrated. No evidence of tricuspid stenosis. Aortic Valve: The aortic valve is tricuspid. There is moderate calcification of the aortic valve. There is moderate thickening of the aortic valve. There is moderate aortic valve annular calcification. Aortic valve regurgitation is mild. Mild aortic stenosis is present. Aortic valve mean gradient measures 15.0 mmHg. Aortic valve peak gradient measures 24.6 mmHg. Aortic valve area, by VTI measures 1.57 cm. Pulmonic Valve: The pulmonic valve was not well visualized. Pulmonic valve regurgitation is not visualized. No evidence of pulmonic stenosis. Aorta: The aortic root is normal in size and structure. Pulmonary Artery: Indeterminant PASP, inadequate TR jet. Venous: The inferior vena cava is normal in size with greater than 50% respiratory variability, suggesting right atrial pressure of 3 mmHg. IAS/Shunts: No atrial level shunt detected by color flow Doppler.  LEFT VENTRICLE PLAX 2D LVIDd:         4.48 cm  Diastology LVIDs:         2.45 cm  LV  e' medial:    6.42 cm/s LV PW:         1.29 cm  LV E/e' medial:  20.7 LV IVS:        1.41 cm  LV e' lateral:   7.51 cm/s LVOT diam:     2.10 cm  LV E/e' lateral: 17.7 LV SV:         95 LV SV Index:   45 LVOT Area:     3.46 cm  RIGHT VENTRICLE RV S prime:     14.60 cm/s TAPSE (M-mode): 2.0 cm LEFT ATRIUM             Index       RIGHT ATRIUM           Index LA diam:        4.00 cm 1.89 cm/m  RA Area:     16.90 cm LA Vol (A2C):   75.0 ml 35.48 ml/m RA Volume:   43.80 ml  20.72 ml/m LA Vol (A4C):   60.8 ml 28.76 ml/m LA Biplane Vol: 68.4 ml 32.36 ml/m  AORTIC VALVE AV Area (Vmax):    1.51 cm AV Area (Vmean):   1.44 cm AV Area (VTI):     1.57 cm AV Vmax:           248.00 cm/s AV Vmean:          184.000 cm/s AV VTI:            0.602 m AV Peak Grad:      24.6 mmHg AV Mean Grad:      15.0 mmHg LVOT Vmax:  108.00 cm/s LVOT Vmean:        76.500 cm/s LVOT VTI:          0.273 m LVOT/AV VTI ratio: 0.45  AORTA Ao Root diam: 3.20 cm MITRAL VALVE MV Area (PHT): 3.63 cm     SHUNTS MV Decel Time: 209 msec     Systemic VTI:  0.27 m MV E velocity: 133.00 cm/s  Systemic Diam: 2.10 cm MV A velocity: 141.00 cm/s MV E/A ratio:  0.94 Carlyle Dolly Schmitt Electronically signed by Carlyle Dolly Schmitt Signature Date/Time: 01/12/2020/12:28:04 PM    Final       Subjective: Feeling well.  No right foot pain.  No fever.  No confusion.  Discharge Exam: Vitals:   01/15/20 0330 01/15/20 0822  BP: (!) 159/71 (!) 169/65  Pulse: 84 82  Resp: 20   Temp: 98.4 F (36.9 C)   SpO2: 99% 98%   Vitals:   01/14/20 2300 01/14/20 2330 01/15/20 0330 01/15/20 0822  BP: (!) 160/68 (!) 157/68 (!) 159/71 (!) 169/65  Pulse: 85 84 84 82  Resp: _0 Temp:  98.3 F (36.8 C) 98.4 F (36.9 C)   TempSrc:  Oral Oral   SpO2: 99% 99% 99% 98%  Weight:      Height:        General: Pt is alert, awake, not in acute distress Cardiovascular: RRR, nl S1-S2, no murmurs appreciated.   No LE edema.   Respiratory: Normal respiratory  rate and rhythm.  CTAB without rales or wheezes. Abdominal: Abdomen soft and non-tender.  No distension or HSM.   Neuro/Psych: Strength symmetric in upper and lower extremities.  Judgment and insight appear normal.   The results of significant diagnostics from this hospitalization (including imaging, microbiology, ancillary and laboratory) are listed below for reference.     Microbiology: Recent Results (from the past 240 hour(s))  Blood culture (routine x 2)     Status: None (Preliminary result)   Collection Time: 01/11/20  3:41 PM   Specimen: Left Antecubital; Blood  Result Value Ref Range Status   Specimen Description LEFT ANTECUBITAL  Final   Special Requests   Final    BOTTLES DRAWN AEROBIC AND ANAEROBIC Blood Culture adequate volume   Culture   Final    NO GROWTH 4 DAYS Performed at [facility], [address], [place], [place] [address]    Report Status PENDING  Incomplete  Blood culture (routine x 2)     Status: None (Preliminary result)   Collection Time: 01/11/20  3:54 PM   Specimen: Right Antecubital; Blood  Result Value Ref Range Status   Specimen Description RIGHT ANTECUBITAL  Final   Special Requests   Final    BOTTLES DRAWN AEROBIC AND ANAEROBIC Blood Culture adequate volume   Culture   Final    NO GROWTH 4 DAYS Performed at [facility], [address], [place], [place] [address]    Report Status PENDING  Incomplete  SARS Coronavirus 2 by RT PCR (hospital order, performed in [place] hospital lab) Nasopharyngeal Nasopharyngeal Swab     Status: None   Collection Time: 01/11/20  5:38 PM   Specimen: Nasopharyngeal Swab  Result Value Ref Range Status   SARS Coronavirus 2 NEGATIVE NEGATIVE Final    Comment: (NOTE) SARS-CoV-2 target nucleic acids are NOT DETECTED.  The SARS-CoV-2 RNA is generally detectable in upper and lower respiratory specimens during the acute phase of infection. The lowest concentration of SARS-CoV-2 viral copies this  assay can detect is  250 copies / mL. A negative result does not preclude SARS-CoV-2 infection and should not be used as the sole basis for treatment or other patient management decisions.  A negative result may occur with improper specimen collection / handling, submission of specimen other than nasopharyngeal swab, presence of viral mutation(s) within the areas targeted by this assay, and inadequate number of viral copies (<250 copies / mL). A negative result must be combined with clinical observations, patient history, and epidemiological information.  Fact Sheet for Patients:   StrictlyIdeas.no  Fact Sheet for Healthcare Providers: BankingDealers.co.za  This test is not yet approved or  cleared by the [place] FDA and has been authorized for detection and/or diagnosis of SARS-CoV-2 by FDA under an Emergency Use Authorization (EUA).  This EUA will remain in effect (meaning this test can be used) for the duration of the COVID-19 declaration under Section 564(b)(1) of the Act, 21 U.S.C. section 360bbb-3(b)(1), unless the authorization is terminated or revoked sooner.  Performed at [facility], [address] [place], [place] [address]   Surgical PCR screen     Status: None   Collection Time: 01/13/20 10:16 PM   Specimen: Nasal Mucosa; Nasal Swab  Result Value Ref Range Status   MRSA, PCR NEGATIVE NEGATIVE Final   Staphylococcus aureus NEGATIVE NEGATIVE Final    Comment: (NOTE) The Xpert SA Assay (FDA approved for NASAL specimens in patients 71 years of age and older), is one component of a comprehensive surveillance program. It is not intended to diagnose infection nor to guide or monitor treatment. Performed at [facility], [address], [place], [place] [address]      Labs: BNP (last 3 results) No results for input(s): BNP in the last 8760 hours. Basic Metabolic Panel: Recent Labs  Lab 01/11/20 1233 01/12/20 0240 01/13/20 0604  01/14/20 0605 01/15/20 0836  NA 138 135 137 136 137  K 4.2 3.6 3.3* 4.0 3.9  CL 103 103 102 103 107  CO2 _0 18*  GLUCOSE 341* 303* 215* 244* 181*  BUN 25* 22* 19 21* 19  CREATININE 1.12* 0.81 0.94 1.16* 1.09*  CALCIUM 9.1 8.5* 8.7* 8.9 8.7*  MG  --   --  1.4*  --   --    Liver Function Tests: Recent Labs  Lab 01/11/20 1233 01/12/20 0240 01/14/20 0605 01/15/20 0836  AST 14* 11* 14* 14*  ALT _1 ALKPHOS 91 91 111 114  BILITOT 0.5 0.5 0.6 0.6  PROT 7.1 6.6 6.7 6.6  ALBUMIN 3.3* 3.0* 2.7* 2.7*   No results for input(s): LIPASE, AMYLASE in the last 168 hours. No results for input(s): AMMONIA in the last 168 hours. CBC: Recent Labs  Lab 01/11/20 1233 01/12/20 0240 01/13/20 0604 01/14/20 0605 01/15/20 0836  WBC 11.4* 7.9 6.8 6.1 7.7  NEUTROABS 7.8*  --   --  3.6  --   HGB 10.0* 9.1* 9.1* 9.0* 8.6*  HCT 31.7* 28.4* 28.7* 28.5* 28.0*  MCV 91.1 91.0 91.7 91.3 90.6  PLT 244 219 224 248 269   Cardiac Enzymes: No results for input(s): CKTOTAL, CKMB, CKMBINDEX, TROPONINI in the last 168 hours. BNP: Invalid input(s): POCBNP CBG: Recent Labs  Lab 01/14/20 1320 01/14/20 1535 01/14/20 2134 01/15/20 0331 01/15/20 0651  GLUCAP 229* 224* 215* 157* 182*   D-Dimer No results for input(s): DDIMER in the last 72 hours. Hgb A1c No results for input(s): HGBA1C in the last 72 hours.  Lipid Profile No results for input(s): CHOL, HDL, LDLCALC, TRIG, CHOLHDL, LDLDIRECT in the last 72 hours. Thyroid function studies No results for input(s): TSH, T4TOTAL, T3FREE, THYROIDAB in the last 72 hours.  Invalid input(s): FREET3 Anemia work up No results for input(s): VITAMINB12, FOLATE, FERRITIN, TIBC, IRON, RETICCTPCT in the last 72 hours. Urinalysis    Component Value Date/Time   COLORURINE YELLOW 01/11/2020 1150   APPEARANCEUR HAZY (A) 01/11/2020 1150   LABSPEC 1.021 01/11/2020 1150   PHURINE 5.0 01/11/2020 1150   GLUCOSEU >=500 (A) 01/11/2020 1150   HGBUR  NEGATIVE 01/11/2020 [place] 01/11/2020 1150   KETONESUR NEGATIVE 01/11/2020 1150   PROTEINUR >=300 (A) 01/11/2020 1150   NITRITE NEGATIVE 01/11/2020 [place] 01/11/2020 1150   Sepsis Labs Invalid input(s): PROCALCITONIN,  WBC,  LACTICIDVEN Microbiology Recent Results (from the past 240 hour(s))  Blood culture (routine x 2)     Status: None (Preliminary result)   Collection Time: 01/11/20  3:41 PM   Specimen: Left Antecubital; Blood  Result Value Ref Range Status   Specimen Description LEFT ANTECUBITAL  Final   Special Requests   Final    BOTTLES DRAWN AEROBIC AND ANAEROBIC Blood Culture adequate volume   Culture   Final    NO GROWTH 4 DAYS Performed at [facility], [address], [place], [place] [address]    Report Status PENDING  Incomplete  Blood culture (routine x 2)     Status: None (Preliminary result)   Collection Time: 01/11/20  3:54 PM   Specimen: Right Antecubital; Blood  Result Value Ref Range Status   Specimen Description RIGHT ANTECUBITAL  Final   Special Requests   Final    BOTTLES DRAWN AEROBIC AND ANAEROBIC Blood Culture adequate volume   Culture   Final    NO GROWTH 4 DAYS Performed at [facility], [address] [place], [place] [address]    Report Status PENDING  Incomplete  SARS Coronavirus 2 by RT PCR (hospital order, performed in [place] hospital lab) Nasopharyngeal Nasopharyngeal Swab     Status: None   Collection Time: 01/11/20  5:38 PM   Specimen: Nasopharyngeal Swab  Result Value Ref Range Status   SARS Coronavirus 2 NEGATIVE NEGATIVE Final    Comment: (NOTE) SARS-CoV-2 target nucleic acids are NOT DETECTED.  The SARS-CoV-2 RNA is generally detectable in upper and lower respiratory specimens during the acute phase of infection. The lowest concentration of SARS-CoV-2 viral copies this assay can detect is 250 copies / mL. A negative result does not preclude SARS-CoV-2 infection and should not be used  as the sole basis for treatment or other patient management decisions.  A negative result may occur with improper specimen collection / handling, submission of specimen other than nasopharyngeal swab, presence of viral mutation(s) within the areas targeted by this assay, and inadequate number of viral copies (<250 copies / mL). A negative result must be combined with clinical observations, patient history, and epidemiological information.  Fact Sheet for Patients:   StrictlyIdeas.no  Fact Sheet for Healthcare Providers: BankingDealers.co.za  This test is not yet approved or  cleared by the [place] FDA and has been authorized for detection and/or diagnosis of SARS-CoV-2 by FDA under an Emergency Use Authorization (EUA).  This EUA will remain in effect (meaning this test can be used) for the duration of the COVID-19 declaration under Section 564(b)(1) of the Act, 21 U.S.C. section 360bbb-3(b)(1), unless the authorization is terminated or revoked sooner.  Performed at [facility], [address] [place], [place] [address]   Surgical PCR screen     Status: None   Collection Time: 01/13/20 10:16 PM   Specimen: Nasal Mucosa; Nasal Swab  Result Value Ref Range Status   MRSA, PCR NEGATIVE NEGATIVE Final   Staphylococcus aureus NEGATIVE NEGATIVE Final    Comment: (NOTE) The Xpert SA Assay (FDA approved for NASAL specimens in patients 11 years of age and older), is one component of a comprehensive surveillance program. It is not intended to diagnose infection nor to guide or monitor treatment. Performed at [facility], [address] [place], [place] [address]      Time coordinating discharge: 85 minutes The [place] controlled substances registry was reviewed for this patient     SIGNED:   Edwin Dada, Schmitt  [facility] 01/15/2020, 8:19 PM

## 2020-01-15 NOTE — Progress Notes (Signed)
Orthopedic Tech Progress Note Patient Details:  Kayla Schmitt 1966-03-07 [id number]  Ortho Devices Type of Ortho Device: Postop shoe/boot Ortho Device/Splint Location: RLE Ortho Device/Splint Interventions: Ordered, Application   Post Interventions Patient Tolerated: Well Instructions Provided: Care of device   Donald Pore 01/15/2020, 8:59 AM

## 2020-01-15 NOTE — Consult Note (Addendum)
ORTHOPAEDIC CONSULTATION  REQUESTING PHYSICIAN: Alberteen Samanford, Christopher P, *  Chief Complaint: Ulceration with EHL tendon rupture right foot  HPI: Kayla Schmitt is a 54 y.o. female who presents with ulceration base of the first metatarsal with necrotic rupture of the EHL tendon.  Patient is status post revascularization to the right lower extremity.  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  Patient has type 2 diabetes, uncontrolled with protein caloric malnutrition.  Patient is a former smoker.  Past Medical History:  Diagnosis Date  . Diabetes mellitus without complication (HCC)   . Hypercholesteremia   . Hypertension   . Thyroid disease    Past Surgical History:  Procedure Laterality Date  . ABDOMINAL AORTOGRAM W/LOWER EXTREMITY Bilateral 01/14/2020   Procedure: ABDOMINAL AORTOGRAM W/LOWER EXTREMITY;  Surgeon: Cephus Shellinglark, Christopher J, MD;  [facility];  Service: Cardiovascular;  Laterality: Bilateral;  . AMPUTATION TOE    . PERIPHERAL VASCULAR ATHERECTOMY Right 01/14/2020   Procedure: PERIPHERAL VASCULAR ATHERECTOMY;  Surgeon: Cephus Shellinglark, Christopher J, MD;  [facility];  Service: Cardiovascular;  Laterality: Right;  sfa  . PERIPHERAL VASCULAR INTERVENTION Right 01/14/2020   Procedure: PERIPHERAL VASCULAR INTERVENTION;  Surgeon: Cephus Shellinglark, Christopher J, MD;  [facility];  Service: Cardiovascular;  Laterality: Right;  sfa stent    Social History   Socioeconomic History  . Marital status: Divorced    Spouse name: Not on file  . Number of children: Not on file  . Years of education: Not on file  . Highest education level: Not on file  Occupational History  . Not on file  Tobacco Use  . Smoking status: Former Games developermoker  . Smokeless tobacco: Never Used  Substance and Sexual Activity  . Alcohol use: Not Currently  . Drug use: Yes    Types: Marijuana    Comment: occ  . Sexual activity: Not on file  Other Topics Concern    . Not on file  Social History Narrative  . Not on file   Social Determinants of Health   Financial Resource Strain:   . Difficulty of Paying Living Expenses: Not on file  Food Insecurity:   . Worried About Programme researcher, broadcasting/film/videounning Out of Food in the Last Year: Not on file  . Ran Out of Food in the Last Year: Not on file  Transportation Needs:   . Lack of Transportation (Medical): Not on file  . Lack of Transportation (Non-Medical): Not on file  Physical Activity:   . Days of Exercise per Week: Not on file  . Minutes of Exercise per Session: Not on file  Stress:   . Feeling of Stress : Not on file  Social Connections:   . Frequency of Communication with Friends and Family: Not on file  . Frequency of Social Gatherings with Friends and Family: Not on file  . Attends Religious Services: Not on file  . Active Member of Clubs or Organizations: Not on file  . Attends BankerClub or Organization Meetings: Not on file  . Marital Status: Not on file   Family History  Problem Relation Age of Onset  . Diabetes Other   . CAD Other    - negative except otherwise stated in the family history section Allergies  Allergen Reactions  . Doxycycline Nausea And Vomiting    extreme  . Penicillins     Did it involve swelling of the face/tongue/throat, SOB, or low BP? no Did it involve sudden or severe rash/hives, skin  peeling, or any reaction on the inside of your mouth or nose? yes Did you need to seek medical attention at a hospital or doctor's office? yes When did it last happen?childhood allergy If all above answers are "NO", may proceed with cephalosporin use.    Prior to Admission medications   Medication Sig Start Date End Date Taking? Authorizing Provider  acetaminophen (TYLENOL) 500 MG tablet Take 1,000 mg by mouth every 6 (six) hours as needed for moderate pain, fever or headache.   Yes [provider]  aspirin 81 MG EC tablet Take 1 tablet by mouth daily.   Yes [provider]   atorvastatin (LIPITOR) 40 MG tablet Take 40 mg by mouth daily. 12/14/19  Yes [provider]  cephALEXin (KEFLEX) 500 MG capsule Take 500 mg by mouth 3 (three) times daily.  01/06/20  Yes [provider]  gabapentin (NEURONTIN) 100 MG capsule Take 200 mg by mouth at bedtime. 12/21/19  Yes [provider]  levothyroxine (SYNTHROID) 150 MCG tablet Take 150 mcg by mouth daily. 12/02/19  Yes [provider]  lisinopril-hydrochlorothiazide (ZESTORETIC) 20-25 MG tablet Take 2 tablets by mouth every morning. 10/19/19  Yes [provider]  metFORMIN (GLUCOPHAGE-XR) 500 MG 24 hr tablet Take 1,000 mg by mouth in the morning and at bedtime. 11/16/19  Yes [provider]  Multiple Vitamin (MULTIVITAMIN WITH MINERALS) TABS tablet Take 1 tablet by mouth daily.   Yes [provider]  pantoprazole (PROTONIX) 40 MG tablet Take 40 mg by mouth daily. 12/21/19  Yes [provider]   PERIPHERAL VASCULAR CATHETERIZATION  Result Date: 01/14/2020 Patient name: Kayla Schmitt            MRN: [id number]        DOB: 12/08/1965            Sex: female  01/14/2020 Pre-operative Diagnosis: Critical limb ischemia of the right lower extremity with tissue loss Post-operative diagnosis:  Same Surgeon:  Cephus Shelling, MD Procedure Performed: 1.  Ultrasound-guided access of left common femoral artery 2.  Aortogram including catheter selection of aorta 3.  Bilateral lower extremity arteriogram with selection of third branches in the right lower extremity 4.  Right SFA mechanical orbital atherectomy with distal embolic protection (1.5 mm solid CSI atherectomy device, NAV 6 filter)) 5.  Right SFA angioplasty including drug-coated balloon (5 mm Sterling and 5 mm drug-coated Ranger) 6.  Right SFA stent placement (6 mm x 120 mm drug-coated Eluvia and 6 mm x 60 mm drug-coated Eluvia) 7.  Right above-knee popliteal artery angioplasty (4 mm Sterling) 8.  95 minutes monitored  moderate conscious sedation time  Indications: Patient is a 54 year old female with diabetes and morbid obesity who presents as a transfer from [facility] for evaluation of right foot ulcer.  She had a CT scan at [facility] that shows multilevel occlusive disease including SFA and popliteal disease and presents for planned right lower extremity arteriogram and possible intervention for attempted limb salvage.  Risks and benefits discussed.  Findings:  Aortogram showed patent renal arteries bilaterally with patent aortoiliac segment and no flow-limiting stenosis.  On the right she had patent common femoral and profunda with a calcified and diseased long segment SFA with mutli-focal high grade stenosis over 220 mm that was heavily calcified with a short chronic total occlusion and reconstitution of a more robust above-knee popliteal artery.  There was a focal 60 to 70% stenosis in the above knee popliteal artery just above the  knee joint.  Below-knee popliteal artery was patent without flow-limiting stenosis and she had three-vessel runoff.  Dominant runoff into the foot is to the posterior tibial and anterior tibial appears to occlude at the ankle.   Left lower extremity runoff shows a patent common femoral profunda with again diffusely diseased SFA that is very diminutive proximally for long segment and she has a chronic total occlusion with reconstitution of above-knee popliteal artery with three-vessel runoff.  Ultimately the right SFA lesion was crossed and the entire segment was treated with 1.5 mm solid CSI atherectomy device at low medium and high speeds with distal embolic protection in the above-knee popliteal artery.  I then angioplastied this with a 5 mm balloon and then a 5 mm drug-coated balloon.  Unfortunately there were several dissections in the artery and elected to treat these with stents given the dissection did appear to compromise the lumen of the vessel.  I placed two drug-coated Eluvia  stents postdilated with a 5 mm balloon.  I then treated the focal above-knee popliteal artery stenosis with a 4 mm angioplasty balloon.  Completion of the case she had preserved three-vessel runoff that was very brisk.             Procedure:  The patient was identified in the holding area and taken to room 8.  The patient was then placed supine on the table and prepped and draped in the usual sterile fashion.  A time out was called.  Ultrasound was used to evaluate the left common femoral artery.  It was patent .  A digital ultrasound image was acquired.  A micropuncture needle was used to access the left common femoral artery under ultrasound guidance.  An 018 wire was advanced without resistance and a micropuncture sheath was placed.  The 018 wire was removed and a benson wire was placed.  The micropuncture sheath was exchanged for a 5 french sheath.  An omniflush catheter was advanced over the wire to the level of L-1.  An abdominal angiogram was obtained.  Next the catheter was pulled down and bilateral lower extremity runoff was obtained.  Ultimately after evaluating imaging elected to intervene on the right SFA disease.  Subsequently used a Glidewire advantage and Omni Flush catheter to cannulate the right iliac and we ultimately had a wire into the proximal SFA on the right.  Exchanged for a long 6 [place] Ansell sheath in the left groin over the aortic bifurcation.  Patient was given 100 units as per kilogram heparin.  I then used the quick cross catheter with a Glidewire advantage to cross the SFA into a more patent above-knee popliteal artery.  We confirmed with hand-injection we were in the true lumen.  We then used our quick cross to exchange for an 014 Viper wire placed down the posterior tibial with a tapered tip.  A nav 6 filter was deployed in the popliteal artery behind the knee.  A 1.5 mm CSI solid atherectomy device was chosen and atherectomy was performed of the SFA at low medium and high speeds  giving nitroglycerin and Viper slide throughout the case.  We then treated this entire segment with a 5 mm angioplasty balloon in the right SFA and then a 5 mm drug-coated Ranger all the way up to the SFA ostium.  Ultimately there were several dissections and I thought with prolonged inflation of a drug-coated balloon at low pressure this would hopefully resolve.  It did not and given that I felt like  the dissections did compromise the lumen diameter I elected to place two drug-coated Olivia stents as noted above in the right SFA that were postdilated with a 5 mm angioplasty balloon.  We then had a widely patent SFA with no residual stenosis although still diseased and calcified.  I then went ahead and treated the above-knee popliteal artery with a 4 mm Sterling for a focal 60 to 70% stenosis at prolonged low inflation pressure.  She had preserved three-vessel runoff at completion of the case and she now has inline flow.  Given fairly small arteries that are heavily diseased I elected not to place a closure device and she will go to holding to have the left groin sheath removed.  Plan: Patient has in line flow down the right leg now.  Patient will be loaded on Plavix.  Orthopedic surgery will be consulted to evaluate for foot debridement.  Cephus Shelling, MD [facility] [place] Office: [phone]  - pertinent xrays, CT, MRI studies were reviewed and independently interpreted  Positive ROS: All other systems have been reviewed and were otherwise negative with the exception of those mentioned in the HPI and as above.  Physical Exam: General: Alert, no acute distress Psychiatric: Patient is competent for consent with normal mood and affect Lymphatic: No axillary or cervical lymphadenopathy Cardiovascular: No pedal edema Respiratory: No cyanosis, no use of accessory musculature GI: No organomegaly, abdomen is soft and non-tender    Images:  @ENCIMAGES @  Labs:  Lab  Results  Component Value Date   HGBA1C 11.0 (H) 01/11/2020   ESRSEDRATE 136 (H) 01/14/2020   ESRSEDRATE 126 (H) 01/13/2020   ESRSEDRATE 125 (H) 01/12/2020   CRP 11.3 (H) 01/14/2020   CRP 13.0 (H) 01/13/2020   CRP 15.8 (H) 01/12/2020   REPTSTATUS PENDING 01/11/2020   CULT  01/11/2020    NO GROWTH 4 DAYS Performed at [facility], [address], [place], [place]     Lab Results  Component Value Date   ALBUMIN 2.7 (L) 01/14/2020   ALBUMIN 3.0 (L) 01/12/2020   ALBUMIN 3.3 (L) 01/11/2020    Neurologic: Patient does not have protective sensation bilateral lower extremities.   MUSCULOSKELETAL:   Skin: Examination of the ulcer over the base of the first metatarsal right foot is approximately 2 cm in diameter 5 mm deep the wound bed is healthier than it was prior to revascularization there is no exposed bone.  Review the MRI scan shows a retracted EHL tendon degenerative rupture.  There is no increased uptake signals at the base of the first and second metatarsal no evidence of abscess or osteomyelitis.  Patient has a palpable dorsalis pedis pulse.  Patient's most recent hemoglobin A1c is 11.  Albumin 2.7.  Assessment: Assessment: Uncontrolled type 2 diabetes status post revascularization to the right lower extremity with a ulcer over the base of the first and second metatarsal with necrotic retracted EHL tendon.  Plan: Plan: Since patient's foot is improving from her prerevascularization images, will proceed with Santyl dressing changes, recommended minimize weightbearing on the right foot, with recommended discharge on oral antibiotics Bactrim DS twice daily for 4 weeks.  I will follow-up in the office next week.  Discussed with the patient  first ray amputation is a possibility.  Thank you for the consult and the opportunity to see Ms. 01/13/2020, MD [facility] [phone] 7:27 AM

## 2020-01-15 NOTE — Progress Notes (Addendum)
  Progress Note    01/15/2020 7:20 AM 1 Day Post-Op  Subjective:  Wants to go home   Vitals:   01/14/20 2330 01/15/20 0330  BP: (!) 157/68 (!) 159/71  Pulse: 84 84  Resp: 18 20  Temp: 98.3 F (36.8 C) 98.4 F (36.9 C)  SpO2: 99% 99%   Physical Exam: Lungs:  Non labored Incisions:  L groin incision soft without hematoma Extremities:  Brisk PT and DP by doppler Neurologic: A&O  CBC    Component Value Date/Time   WBC 6.1 01/14/2020 0605   RBC 3.12 (L) 01/14/2020 0605   HGB 9.0 (L) 01/14/2020 0605   HCT 28.5 (L) 01/14/2020 0605   PLT 248 01/14/2020 0605   MCV 91.3 01/14/2020 0605   MCH 28.8 01/14/2020 0605   MCHC 31.6 01/14/2020 0605   RDW 13.2 01/14/2020 0605   LYMPHSABS 1.8 01/14/2020 0605   MONOABS 0.5 01/14/2020 0605   EOSABS 0.2 01/14/2020 0605   BASOSABS 0.0 01/14/2020 0605    BMET    Component Value Date/Time   NA 136 01/14/2020 0605   K 4.0 01/14/2020 0605   CL 103 01/14/2020 0605   CO2 24 01/14/2020 0605   GLUCOSE 244 (H) 01/14/2020 0605   BUN 21 (H) 01/14/2020 0605   CREATININE 1.16 (H) 01/14/2020 0605   CALCIUM 8.9 01/14/2020 0605   GFRNONAA 54 (L) 01/14/2020 0605   GFRAA >60 01/14/2020 0605    INR No results found for: INR   Intake/Output Summary (Last 24 hours) at 01/15/2020 0720 Last data filed at 01/15/2020 0130 Gross per 24 hour  Intake 2183.01 ml  Output 450 ml  Net 1733.01 ml     Assessment/Plan:  54 y.o. female is s/p R SFA atherectomy and stent 1 Day Post-Op   R foot well perfused with PT doppler signal L groin without firm hematoma Outpatient follow up for R foot wound with Dr. Renae Gloss for discharge on aspirin and plavix from vascular standpoint with follow up in 1 month   Emilie Rutter, PA-C [facility] [phone] 01/15/2020 7:20 AM  I have seen and evaluated the patient. I agree with the PA note as documented above.  Postop day 1 status post right lower extremity revascularization including  right SFA mechanical atherectomy with balloon angioplasty including drug-coated balloon and ultimately requiring several drug-coated stents.  She has very brisk dorsalis pedis and posterior tibial signals in the right foot today.  Left groin looks okay at the access site.  She will need aspirin and Plavix daily at discharge.  Appreciate Dr. Audrie Lia consult and he is recommending no debridement at this time with outpatient follow-up.  Okay for discharge from my standpoint.  Will arrange follow-up in 1 month with right leg arterial duplex and ABIs.  Cephus Shelling, MD [facility] [place] Office: [phone]

## 2020-01-16 LAB — CULTURE, BLOOD (ROUTINE X 2)
Culture: NO GROWTH
Culture: NO GROWTH
Special Requests: ADEQUATE
Special Requests: ADEQUATE

## 2020-01-22 ENCOUNTER — Encounter: Payer: Self-pay | Admitting: Physician Assistant

## 2020-01-22 ENCOUNTER — Ambulatory Visit (INDEPENDENT_AMBULATORY_CARE_PROVIDER_SITE_OTHER): Payer: BC Managed Care – PPO | Admitting: Physician Assistant

## 2020-01-22 VITALS — Ht 64.0 in | Wt 240.0 lb

## 2020-01-22 DIAGNOSIS — L03115 Cellulitis of right lower limb: Secondary | ICD-10-CM | POA: Diagnosis not present

## 2020-01-22 NOTE — Progress Notes (Signed)
Office Visit Note   Patient: Kayla Schmitt           Date of Birth: 09-27-1965           MRN: [id number] Visit Date: 01/22/2020              Requested by: Medicine, [facility] Internal [address] [place],  [place] [address] PCP: Medicine, [facility] Internal  Chief Complaint  Patient presents with  . Right Foot - Pain      HPI: This is a pleasant 54 year old woman who is consulted on in the hospital by Dr. Lajoyce Corners.  She has a history of uncontrolled diabetes.  Last week she was admitted to the hospital and concerns that she needed an amputation of her first ray.  She did have revascularization and did have some good improvement.  She has been doing Santyl dressing changes.  She has been on Bactrim  Assessment & Plan: Visit Diagnoses: No diagnosis found.  Plan: Continue with dressing changes continue with antibiotic she does have some pink wound edges but I am still concerned a little bit about the fibrinous tissue at the base of the wound.  She will follow-up in 1 week.  Follow-Up Instructions: No follow-ups on file.   Ortho Exam  Patient is alert, oriented, no adenopathy, well-dressed, normal affect, normal respiratory effort. Focused examination of the wound she has about a 5 cm diameter wound at the base of her great toe.  She does have exposed EHL degenerative changes.  She has some sparse bleeding in the area.  Wound that was proximal to this has completely healed.  No surrounding cellulitis foul odor or concerns for infection at this time.  Imaging: No results found. No images are attached to the encounter.  Labs: Lab Results  Component Value Date   HGBA1C 11.0 (H) 01/11/2020   ESRSEDRATE 136 (H) 01/14/2020   ESRSEDRATE 126 (H) 01/13/2020   ESRSEDRATE 125 (H) 01/12/2020   CRP 11.3 (H) 01/14/2020   CRP 13.0 (H) 01/13/2020   CRP 15.8 (H) 01/12/2020   REPTSTATUS 01/16/2020 FINAL 01/11/2020   CULT  01/11/2020    NO GROWTH 5 DAYS Performed at [facility], [address],  [place], [place] [address]      Lab Results  Component Value Date   ALBUMIN 2.7 (L) 01/15/2020   ALBUMIN 2.7 (L) 01/14/2020   ALBUMIN 3.0 (L) 01/12/2020    Lab Results  Component Value Date   MG 1.4 (L) 01/13/2020   No results found for: VD25OH  No results found for: PREALBUMIN CBC EXTENDED Latest Ref Rng & Units 01/15/2020 01/14/2020 01/13/2020  WBC 4.0 - 10.5 K/uL 7.7 6.1 6.8  RBC 3.87 - 5.11 MIL/uL 3.09(L) 3.12(L) 3.13(L)  HGB 12.0 - 15.0 g/dL 0.1(E) 0.7(H) 2.1(F)  HCT 36 - 46 % 28.0(L) 28.5(L) 28.7(L)  PLT 150 - 400 K/uL 269 248 224  NEUTROABS 1.7 - 7.7 K/uL - 3.6 -  LYMPHSABS 0.7 - 4.0 K/uL - 1.8 -     Body mass index is 41.2 kg/m.  Orders:  No orders of the defined types were placed in this encounter.  No orders of the defined types were placed in this encounter.    Procedures: No procedures performed  Clinical Data: No additional findings.  ROS:  All other systems negative, except as noted in the HPI. Review of Systems  Objective: Vital Signs: Ht 5\' 4"  (1.626 m)   Wt 240 lb (108.9 kg)   BMI 41.20 kg/m  Specialty Comments:  No specialty comments available.  PMFS History: Patient Active Problem List   Diagnosis Date Noted  . Ulcer of right foot with fat layer exposed (HCC)   . Cellulitis of right foot   . Peripheral arterial disease (HCC)   . Cellulitis 01/11/2020   Past Medical History:  Diagnosis Date  . Diabetes mellitus without complication (HCC)   . Hypercholesteremia   . Hypertension   . Thyroid disease     Family History  Problem Relation Age of Onset  . Diabetes Other   . CAD Other     Past Surgical History:  Procedure Laterality Date  . ABDOMINAL AORTOGRAM W/LOWER EXTREMITY Bilateral 01/14/2020   Procedure: ABDOMINAL AORTOGRAM W/LOWER EXTREMITY;  Surgeon: Cephus Shelling, MD;  Location: [facility] INVASIVE CV LAB;  Service: Cardiovascular;  Laterality: Bilateral;  . AMPUTATION TOE    . PERIPHERAL VASCULAR ATHERECTOMY Right  01/14/2020   Procedure: PERIPHERAL VASCULAR ATHERECTOMY;  Surgeon: Cephus Shelling, MD;  [facility]: [facility] INVASIVE CV LAB;  Service: Cardiovascular;  Laterality: Right;  sfa  . PERIPHERAL VASCULAR INTERVENTION Right 01/14/2020   Procedure: PERIPHERAL VASCULAR INTERVENTION;  Surgeon: Cephus Shelling, MD;  Location: [facility] INVASIVE CV LAB;  Service: Cardiovascular;  Laterality: Right;  sfa stent    Social History   Occupational History  . Not on file  Tobacco Use  . Smoking status: Former Games developer  . Smokeless tobacco: Never Used  Substance and Sexual Activity  . Alcohol use: Not Currently  . Drug use: Yes    Types: Marijuana    Comment: occ  . Sexual activity: Not on file

## 2020-01-28 ENCOUNTER — Other Ambulatory Visit: Payer: Self-pay

## 2020-01-28 ENCOUNTER — Ambulatory Visit (INDEPENDENT_AMBULATORY_CARE_PROVIDER_SITE_OTHER): Payer: BC Managed Care – PPO | Admitting: Physician Assistant

## 2020-01-28 ENCOUNTER — Encounter: Payer: Self-pay | Admitting: Physician Assistant

## 2020-01-28 VITALS — Ht 64.0 in | Wt 240.0 lb

## 2020-01-28 DIAGNOSIS — E039 Hypothyroidism, unspecified: Secondary | ICD-10-CM | POA: Insufficient documentation

## 2020-01-28 DIAGNOSIS — E118 Type 2 diabetes mellitus with unspecified complications: Secondary | ICD-10-CM | POA: Insufficient documentation

## 2020-01-28 DIAGNOSIS — E119 Type 2 diabetes mellitus without complications: Secondary | ICD-10-CM | POA: Insufficient documentation

## 2020-01-28 DIAGNOSIS — I10 Essential (primary) hypertension: Secondary | ICD-10-CM | POA: Insufficient documentation

## 2020-01-28 DIAGNOSIS — I739 Peripheral vascular disease, unspecified: Secondary | ICD-10-CM

## 2020-01-28 DIAGNOSIS — L97512 Non-pressure chronic ulcer of other part of right foot with fat layer exposed: Secondary | ICD-10-CM | POA: Diagnosis not present

## 2020-01-28 DIAGNOSIS — E66811 Obesity, class 1: Secondary | ICD-10-CM | POA: Insufficient documentation

## 2020-01-28 DIAGNOSIS — E1169 Type 2 diabetes mellitus with other specified complication: Secondary | ICD-10-CM | POA: Insufficient documentation

## 2020-01-28 DIAGNOSIS — E669 Obesity, unspecified: Secondary | ICD-10-CM | POA: Insufficient documentation

## 2020-01-28 DIAGNOSIS — E785 Hyperlipidemia, unspecified: Secondary | ICD-10-CM | POA: Insufficient documentation

## 2020-01-28 MED ORDER — NITROGLYCERIN 0.2 MG/HR TD PT24: 0.2000 mg | MEDICATED_PATCH | Freq: Every day | TRANSDERMAL | 12 refills | Status: DC

## 2020-01-28 MED ORDER — PENTOXIFYLLINE ER 400 MG PO TBCR: 400.0000 mg | EXTENDED_RELEASE_TABLET | Freq: Three times a day (TID) | ORAL | 3 refills | Status: DC

## 2020-01-28 NOTE — Progress Notes (Signed)
Office Visit Note   Patient: Kayla Schmitt           Date of Birth: 09/19/1965           MRN: [id number] Visit Date: 01/28/2020              Requested by: Medicine, [facility] Internal [address] [place],  [place] [address] PCP: Medicine, [facility] Internal  Chief Complaint  Patient presents with  . Right Foot - Follow-up      HPI: Patient presents today for follow-up on her right dorsum foot.  She is status post revascularization with a open ulcer on her right foot that we have been following.  She is currently taking Bactrim and using Santyl dressing changes  Assessment & Plan: Visit Diagnoses: No diagnosis found.  Plan: Patient was seen by Dr. Lajoyce Corners we will add Trental and nitroglycerin patches.  Follow-up in 1 week.  Follow-Up Instructions: No follow-ups on file.   Ortho Exam  Patient is alert, oriented, no adenopathy, well-dressed, normal affect, normal respiratory effort. Mild soft tissue swelling she has a 6 x 4 cm ulcer.  She has fibrinous tissue coverage.  No foul odor no drainage.  No ascending cellulitis  Imaging: No results found. No images are attached to the encounter.  Labs: Lab Results  Component Value Date   HGBA1C 11.0 (H) 01/11/2020   ESRSEDRATE 136 (H) 01/14/2020   ESRSEDRATE 126 (H) 01/13/2020   ESRSEDRATE 125 (H) 01/12/2020   CRP 11.3 (H) 01/14/2020   CRP 13.0 (H) 01/13/2020   CRP 15.8 (H) 01/12/2020   REPTSTATUS 01/16/2020 FINAL 01/11/2020   CULT  01/11/2020    NO GROWTH 5 DAYS Performed at [facility], [address] [place], [place] [address]      Lab Results  Component Value Date   ALBUMIN 2.7 (L) 01/15/2020   ALBUMIN 2.7 (L) 01/14/2020   ALBUMIN 3.0 (L) 01/12/2020    Lab Results  Component Value Date   MG 1.4 (L) 01/13/2020   No results found for: VD25OH  No results found for: PREALBUMIN CBC EXTENDED Latest Ref Rng & Units 01/15/2020 01/14/2020 01/13/2020  WBC 4.0 - 10.5 K/uL 7.7 6.1 6.8  RBC 3.87 - 5.11 MIL/uL 3.09(L) 3.12(L)  3.13(L)  HGB 12.0 - 15.0 g/dL 8.3(T) 5.1(V) 6.1(Y)  HCT 36 - 46 % 28.0(L) 28.5(L) 28.7(L)  PLT 150 - 400 K/uL 269 248 224  NEUTROABS 1.7 - 7.7 K/uL - 3.6 -  LYMPHSABS 0.7 - 4.0 K/uL - 1.8 -     Body mass index is 41.2 kg/m.  Orders:  No orders of the defined types were placed in this encounter.  Meds ordered this encounter  Medications  . nitroGLYCERIN (NITRODUR - DOSED IN MG/24 HR) 0.2 mg/hr patch    Sig: Place 1 patch (0.2 mg total) onto the skin daily.    Dispense:  30 patch    Refill:  12  . pentoxifylline (TRENTAL) 400 MG CR tablet    Sig: Take 1 tablet (400 mg total) by mouth 3 (three) times daily with meals.    Dispense:  90 tablet    Refill:  3     Procedures: No procedures performed  Clinical Data: No additional findings.  ROS:  All other systems negative, except as noted in the HPI. Review of Systems  Objective: Vital Signs: Ht 5\' 4"  (1.626 m)   Wt 240 lb (108.9 kg)   BMI 41.20 kg/m   Specialty Comments:  No specialty comments available.  PMFS History: Patient Active Problem List   Diagnosis Date Noted  . DM type 2 (diabetes mellitus, type 2) (HCC) 01/28/2020  . Hyperlipidemia 01/28/2020  . Hypertension 01/28/2020  . Hypothyroidism 01/28/2020  . Obesity 01/28/2020  . Ulcer of right foot with fat layer exposed (HCC)   . Cellulitis of right foot   . Peripheral arterial disease (HCC)   . Cellulitis 01/11/2020  . Blister of leg 01/06/2020  . 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/28/2017  . Chest pain 10/28/2017  . GERD (gastroesophageal reflux disease) 10/28/2017   Past Medical History:  Diagnosis Date  . Diabetes mellitus without complication (HCC)   . Hypercholesteremia   . Hypertension   . Thyroid disease     Family History  Problem Relation Age of Onset  . Diabetes Other   . CAD Other     Past Surgical History:  Procedure Laterality Date  . ABDOMINAL AORTOGRAM W/LOWER EXTREMITY Bilateral 01/14/2020   Procedure: ABDOMINAL  AORTOGRAM W/LOWER EXTREMITY;  Surgeon: Cephus Shelling, MD;  Location: [facility] INVASIVE CV LAB;  Service: Cardiovascular;  Laterality: Bilateral;  . AMPUTATION TOE    . PERIPHERAL VASCULAR ATHERECTOMY Right 01/14/2020   Procedure: PERIPHERAL VASCULAR ATHERECTOMY;  Surgeon: Cephus Shelling, MD;  Location[facility] INVASIVE CV LAB;  Service: Cardiovascular;  Laterality: Right;  sfa  . PERIPHERAL VASCULAR INTERVENTION Right 01/14/2020   Procedure: PERIPHERAL VASCULAR INTERVENTION;  Surgeon: Cephus Shelling, MD;  Location: [facility] INVASIVE CV LAB;  Service: Cardiovascular;  Laterality: Right;  sfa stent    Social History   Occupational History  . Not on file  Tobacco Use  . Smoking status: Former Games developer  . Smokeless tobacco: Never Used  Substance and Sexual Activity  . Alcohol use: Not Currently  . Drug use: Yes    Types: Marijuana    Comment: occ  . Sexual activity: Not on file

## 2020-02-04 ENCOUNTER — Ambulatory Visit (INDEPENDENT_AMBULATORY_CARE_PROVIDER_SITE_OTHER): Payer: BC Managed Care – PPO | Admitting: Physician Assistant

## 2020-02-04 ENCOUNTER — Encounter: Payer: Self-pay | Admitting: Physician Assistant

## 2020-02-04 DIAGNOSIS — L97512 Non-pressure chronic ulcer of other part of right foot with fat layer exposed: Secondary | ICD-10-CM

## 2020-02-04 NOTE — Progress Notes (Signed)
Office Visit Note   Patient: Kayla Schmitt           Date of Birth: Aug 03, 1965           MRN: [id number] Visit Date: 02/04/2020              Requested by: Medicine, [facility] Internal [address] [place],  [place] [address] PCP: Medicine, [facility] Internal  Chief Complaint  Patient presents with  . Right Foot - Pain      HPI: This is a pleasant woman who follows up today for the dorsum of her right foot she is status post revascularization with an open ulcer on her right foot that we have been following she is currently taking Bactrim and using Santyl dressing changes.  She feels is a little bit better she is also using Trental and a nitroglycerin patch  Assessment & Plan: Visit Diagnoses: No diagnosis found.  Plan: Patient will continue with current dressing change we will follow up in 1 week  Follow-Up Instructions: No follow-ups on file.   Ortho Exam  Patient is alert, oriented, no adenopathy, well-dressed, normal affect, normal respiratory effort. Ulcer measures about 5 cm in diameter is about 2 cm deep.  There is fibrinous tissue at the base but with debridement does have bleeding tissue beneath this.  Some epithelialization is noted on the edges no cellulitis swelling is well controlled no foul odor  Imaging: No results found. No images are attached to the encounter.  Labs: Lab Results  Component Value Date   HGBA1C 11.0 (H) 01/11/2020   ESRSEDRATE 136 (H) 01/14/2020   ESRSEDRATE 126 (H) 01/13/2020   ESRSEDRATE 125 (H) 01/12/2020   CRP 11.3 (H) 01/14/2020   CRP 13.0 (H) 01/13/2020   CRP 15.8 (H) 01/12/2020   REPTSTATUS 01/16/2020 FINAL 01/11/2020   CULT  01/11/2020    NO GROWTH 5 DAYS Performed at [facility], [address], [place], [place] [address]      Lab Results  Component Value Date   ALBUMIN 2.7 (L) 01/15/2020   ALBUMIN 2.7 (L) 01/14/2020   ALBUMIN 3.0 (L) 01/12/2020    Lab Results  Component Value Date   MG 1.4 (L) 01/13/2020   No results  found for: VD25OH  No results found for: PREALBUMIN CBC EXTENDED Latest Ref Rng & Units 01/15/2020 01/14/2020 01/13/2020  WBC 4.0 - 10.5 K/uL 7.7 6.1 6.8  RBC 3.87 - 5.11 MIL/uL 3.09(L) 3.12(L) 3.13(L)  HGB 12.0 - 15.0 g/dL 7.0(Y) 6.3(Z) 8.5(Y)  HCT 36 - 46 % 28.0(L) 28.5(L) 28.7(L)  PLT 150 - 400 K/uL 269 248 224  NEUTROABS 1.7 - 7.7 K/uL - 3.6 -  LYMPHSABS 0.7 - 4.0 K/uL - 1.8 -     There is no height or weight on file to calculate BMI.  Orders:  No orders of the defined types were placed in this encounter.  No orders of the defined types were placed in this encounter.    Procedures: No procedures performed  Clinical Data: No additional findings.  ROS:  All other systems negative, except as noted in the HPI. Review of Systems  Objective: Vital Signs: There were no vitals taken for this visit.  Specialty Comments:  No specialty comments available.  PMFS History: Patient Active Problem List   Diagnosis Date Noted  . DM type 2 (diabetes mellitus, type 2) (HCC) 01/28/2020  . Hyperlipidemia 01/28/2020  . Hypertension 01/28/2020  . Hypothyroidism 01/28/2020  . Obesity 01/28/2020  . Ulcer of right  foot with fat layer exposed (HCC)   . Cellulitis of right foot   . Peripheral arterial disease (HCC)   . Cellulitis 01/11/2020  . Blister of leg 01/06/2020  . 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/28/2017  . Chest pain 10/28/2017  . GERD (gastroesophageal reflux disease) 10/28/2017   Past Medical History:  Diagnosis Date  . Diabetes mellitus without complication (HCC)   . Hypercholesteremia   . Hypertension   . Thyroid disease     Family History  Problem Relation Age of Onset  . Diabetes Other   . CAD Other     Past Surgical History:  Procedure Laterality Date  . ABDOMINAL AORTOGRAM W/LOWER EXTREMITY Bilateral 01/14/2020   Procedure: ABDOMINAL AORTOGRAM W/LOWER EXTREMITY;  Surgeon: Cephus Shelling, MD;  Location: [facility] INVASIVE CV LAB;  Service:  Cardiovascular;  Laterality: Bilateral;  . AMPUTATION TOE    . PERIPHERAL VASCULAR ATHERECTOMY Right 01/14/2020   Procedure: PERIPHERAL VASCULAR ATHERECTOMY;  Surgeon: Cephus Shelling, MD;  Location: [facility] INVASIVE CV LAB;  Service: Cardiovascular;  Laterality: Right;  sfa  . PERIPHERAL VASCULAR INTERVENTION Right 01/14/2020   Procedure: PERIPHERAL VASCULAR INTERVENTION;  Surgeon: Cephus Shelling, MD;  Location: [facility] INVASIVE CV LAB;  Service: Cardiovascular;  Laterality: Right;  sfa stent    Social History   Occupational History  . Not on file  Tobacco Use  . Smoking status: Former Games developer  . Smokeless tobacco: Never Used  Substance and Sexual Activity  . Alcohol use: Not Currently  . Drug use: Yes    Types: Marijuana    Comment: occ  . Sexual activity: Not on file

## 2020-02-11 ENCOUNTER — Ambulatory Visit: Payer: BC Managed Care – PPO | Admitting: Physician Assistant

## 2020-02-11 ENCOUNTER — Encounter: Payer: Self-pay | Admitting: Physician Assistant

## 2020-02-11 ENCOUNTER — Ambulatory Visit (INDEPENDENT_AMBULATORY_CARE_PROVIDER_SITE_OTHER): Payer: BC Managed Care – PPO | Admitting: Physician Assistant

## 2020-02-11 DIAGNOSIS — L97512 Non-pressure chronic ulcer of other part of right foot with fat layer exposed: Secondary | ICD-10-CM | POA: Diagnosis not present

## 2020-02-11 NOTE — Progress Notes (Signed)
Office Visit Note   Patient: Kayla Schmitt           Date of Birth: 01/15/1966           MRN: [id number] Visit Date: 02/11/2020              Requested by: Medicine, [facility] Internal [address] [place],  [place] [address] PCP: Medicine, [facility] Internal  Chief Complaint  Patient presents with  . Right Foot - Wound Check      HPI: This is a pleasant 54 year old woman who will be seeing on a regular basis.  She is being seen for a open ulcer on the top of her right foot.  She is currently taking Bactrim and using Santyl dressing changes.  She is also using Trental and a nitroglycerin patch.  She is status post revascularization  Assessment & Plan: Visit Diagnoses: No diagnosis found.  Plan: Continue with current treatment.  She feels she is improved.  She will follow-up in 2 weeks sooner if she has any issues.  She continue on antibiotics  Follow-Up Instructions: No follow-ups on file.   Ortho Exam  Patient is alert, oriented, no adenopathy, well-dressed, normal affect, normal respiratory effort. Right foot.  Ulcer measures about 5 cm in diameter.  It is about a centimeter deep there is some reepithelialization around the edges.  She does have fibrinous tissue over approximately 85 to 90% of the base of the ulcer.  No surrounding cellulitis or pain.  Imaging: No results found.   Labs: Lab Results  Component Value Date   HGBA1C 11.0 (H) 01/11/2020   ESRSEDRATE 136 (H) 01/14/2020   ESRSEDRATE 126 (H) 01/13/2020   ESRSEDRATE 125 (H) 01/12/2020   CRP 11.3 (H) 01/14/2020   CRP 13.0 (H) 01/13/2020   CRP 15.8 (H) 01/12/2020   REPTSTATUS 01/16/2020 FINAL 01/11/2020   CULT  01/11/2020    NO GROWTH 5 DAYS Performed at [facility], [address], [place], [place] [address]      Lab Results  Component Value Date   ALBUMIN 2.7 (L) 01/15/2020   ALBUMIN 2.7 (L) 01/14/2020   ALBUMIN 3.0 (L) 01/12/2020    Lab Results  Component Value Date   MG 1.4 (L) 01/13/2020   No  results found for: VD25OH  No results found for: PREALBUMIN CBC EXTENDED Latest Ref Rng & Units 01/15/2020 01/14/2020 01/13/2020  WBC 4.0 - 10.5 K/uL 7.7 6.1 6.8  RBC 3.87 - 5.11 MIL/uL 3.09(L) 3.12(L) 3.13(L)  HGB 12.0 - 15.0 g/dL 3.4(V) 4.2(V) 9.5(G)  HCT 36 - 46 % 28.0(L) 28.5(L) 28.7(L)  PLT 150 - 400 K/uL 269 248 224  NEUTROABS 1.7 - 7.7 K/uL - 3.6 -  LYMPHSABS 0.7 - 4.0 K/uL - 1.8 -     There is no height or weight on file to calculate BMI.  Orders:  No orders of the defined types were placed in this encounter.  No orders of the defined types were placed in this encounter.    Procedures: No procedures performed  Clinical Data: No additional findings.  ROS:  All other systems negative, except as noted in the HPI. Review of Systems  Objective: Vital Signs: There were no vitals taken for this visit.  Specialty Comments:  No specialty comments available.  PMFS History: Patient Active Problem List   Diagnosis Date Noted  . DM type 2 (diabetes mellitus, type 2) (HCC) 01/28/2020  . Hyperlipidemia 01/28/2020  . Hypertension 01/28/2020  . Hypothyroidism 01/28/2020  . Obesity  01/28/2020  . Ulcer of right foot with fat layer exposed (HCC)   . Cellulitis of right foot   . Peripheral arterial disease (HCC)   . Cellulitis 01/11/2020  . Blister of leg 01/06/2020  . 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/28/2017  . Chest pain 10/28/2017  . GERD (gastroesophageal reflux disease) 10/28/2017   Past Medical History:  Diagnosis Date  . Diabetes mellitus without complication (HCC)   . Hypercholesteremia   . Hypertension   . Thyroid disease     Family History  Problem Relation Age of Onset  . Diabetes Other   . CAD Other     Past Surgical History:  Procedure Laterality Date  . ABDOMINAL AORTOGRAM W/LOWER EXTREMITY Bilateral 01/14/2020   Procedure: ABDOMINAL AORTOGRAM W/LOWER EXTREMITY;  Surgeon: Cephus Shelling, MD;  Location: [facility] INVASIVE CV LAB;  Service:  Cardiovascular;  Laterality: Bilateral;  . AMPUTATION TOE    . PERIPHERAL VASCULAR ATHERECTOMY Right 01/14/2020   Procedure: PERIPHERAL VASCULAR ATHERECTOMY;  Surgeon: Cephus Shelling, MD;  Location: [facility] INVASIVE CV LAB;  Service: Cardiovascular;  Laterality: Right;  sfa  . PERIPHERAL VASCULAR INTERVENTION Right 01/14/2020   Procedure: PERIPHERAL VASCULAR INTERVENTION;  Surgeon: Cephus Shelling, MD;  Location: [facility] INVASIVE CV LAB;  Service: Cardiovascular;  Laterality: Right;  sfa stent    Social History   Occupational History  . Not on file  Tobacco Use  . Smoking status: Former Games developer  . Smokeless tobacco: Never Used  Substance and Sexual Activity  . Alcohol use: Not Currently  . Drug use: Yes    Types: Marijuana    Comment: occ  . Sexual activity: Not on file

## 2020-02-16 ENCOUNTER — Other Ambulatory Visit: Payer: Self-pay

## 2020-02-16 ENCOUNTER — Other Ambulatory Visit: Payer: Self-pay | Admitting: Orthopedic Surgery

## 2020-02-16 ENCOUNTER — Ambulatory Visit (HOSPITAL_COMMUNITY)
Admission: RE | Admit: 2020-02-16 | Discharge: 2020-02-16 | Disposition: A | Discharge status: Home / Self Care | Payer: BC Managed Care – PPO | Source: Ambulatory Visit | Attending: Vascular Surgery | Admitting: Vascular Surgery

## 2020-02-16 ENCOUNTER — Ambulatory Visit (INDEPENDENT_AMBULATORY_CARE_PROVIDER_SITE_OTHER): Payer: BC Managed Care – PPO | Admitting: Vascular Surgery

## 2020-02-16 ENCOUNTER — Encounter: Payer: Self-pay | Admitting: Vascular Surgery

## 2020-02-16 ENCOUNTER — Ambulatory Visit (INDEPENDENT_AMBULATORY_CARE_PROVIDER_SITE_OTHER)
Admission: RE | Admit: 2020-02-16 | Discharge: 2020-02-16 | Disposition: A | Discharge status: Home / Self Care | Payer: BC Managed Care – PPO | Source: Ambulatory Visit | Attending: Vascular Surgery | Admitting: Vascular Surgery

## 2020-02-16 VITALS — BP 186/89 | HR 96 | Temp 98.0°F | Resp 14 | Ht 64.0 in | Wt 242.0 lb

## 2020-02-16 DIAGNOSIS — I739 Peripheral vascular disease, unspecified: Secondary | ICD-10-CM

## 2020-02-16 DIAGNOSIS — L97512 Non-pressure chronic ulcer of other part of right foot with fat layer exposed: Secondary | ICD-10-CM | POA: Diagnosis not present

## 2020-02-16 NOTE — Progress Notes (Signed)
Patient name: Kayla Schmitt MRN: [id number] DOB: [birth year] Sex: female  REASON FOR VISIT: Follow-up up after right lower extremity intervention for critical limb ischemia with tissue loss  HPI: Kayla Schmitt is a 54 y.o. female history of hypertension, hyperlipidemia, diabetes that presents for follow-up after recent right SFA atherectomy with stent.  She underwent right leg intervention on 01/14/2020 with right SFA atherectomy and angioplasty and stent placement well as right above-knee popliteal angioplasty.  This was in the setting of tissue loss with wound on the right foot.  She has been seeing Dr. Lajoyce Corners every couple weeks.  Feels the wound is makaing some great progress.  She does not have any pain in the foot.  She is taking aspirin Plavix as instructed.  States she is putting santyl on the wound.   Past Medical History:  Diagnosis Date  . Diabetes mellitus without complication (HCC)   . Hypercholesteremia   . Hypertension   . Thyroid disease     Past Surgical History:  Procedure Laterality Date  . ABDOMINAL AORTOGRAM W/LOWER EXTREMITY Bilateral 01/14/2020   Procedure: ABDOMINAL AORTOGRAM W/LOWER EXTREMITY;  Surgeon: Cephus Shelling, MD;  Location: [facility] INVASIVE CV LAB;  Service: Cardiovascular;  Laterality: Bilateral;  . AMPUTATION TOE    . PERIPHERAL VASCULAR ATHERECTOMY Right 01/14/2020   Procedure: PERIPHERAL VASCULAR ATHERECTOMY;  Surgeon: Cephus Shelling, MD;  Location: [facility] INVASIVE CV LAB;  Service: Cardiovascular;  Laterality: Right;  sfa  . PERIPHERAL VASCULAR INTERVENTION Right 01/14/2020   Procedure: PERIPHERAL VASCULAR INTERVENTION;  Surgeon: Cephus Shelling, MD;  Location: [facility] INVASIVE CV LAB;  Service: Cardiovascular;  Laterality: Right;  sfa stent     Family History  Problem Relation Age of Onset  . Diabetes Other   . CAD Other     SOCIAL HISTORY: Social History   Tobacco Use  . Smoking status: Former Games developer  . Smokeless tobacco:  Never Used  Substance Use Topics  . Alcohol use: Not Currently    Allergies  Allergen Reactions  . Doxycycline Nausea And Vomiting    extreme  . Penicillins     Did it involve swelling of the face/tongue/throat, SOB, or low BP? no Did it involve sudden or severe rash/hives, skin peeling, or any reaction on the inside of your mouth or nose? yes Did you need to seek medical attention at a hospital or doctor's office? yes When did it last happen?childhood allergy If all above answers are "NO", may proceed with cephalosporin use.     Current Outpatient Medications  Medication Sig Dispense Refill  . acetaminophen (TYLENOL) 500 MG tablet Take 1,000 mg by mouth every 6 (six) hours as needed for moderate pain, fever or headache.    Marland Kitchen aspirin 81 MG EC tablet Take 1 tablet by mouth daily.    Marland Kitchen atorvastatin (LIPITOR) 40 MG tablet Take 40 mg by mouth daily.    . clopidogrel (PLAVIX) 75 MG tablet Take 1 tablet (75 mg total) by mouth daily with breakfast. 90 tablet 3  . collagenase (SANTYL) ointment Apply topically daily. 15 g 2  . gabapentin (NEURONTIN) 100 MG capsule Take 200 mg by mouth at bedtime.    . insulin degludec (TRESIBA FLEXTOUCH) 100 UNIT/ML FlexTouch Pen Inject 10 Units into the skin at bedtime. 3 mL 3  . levothyroxine (SYNTHROID) 150 MCG tablet Take 150 mcg by mouth daily.    Marland Kitchen lisinopril-hydrochlorothiazide (ZESTORETIC) 20-25 MG tablet Take 2 tablets by mouth every morning.    . metFORMIN (  GLUCOPHAGE-XR) 500 MG 24 hr tablet Take 1,000 mg by mouth in the morning and at bedtime.    . Multiple Vitamin (MULTIVITAMIN WITH MINERALS) TABS tablet Take 1 tablet by mouth daily.    . nitroGLYCERIN (NITRODUR - DOSED IN MG/24 HR) 0.2 mg/hr patch Place 1 patch (0.2 mg total) onto the skin daily. 30 patch 12  . pantoprazole (PROTONIX) 40 MG tablet Take 40 mg by mouth daily.    . pentoxifylline (TRENTAL) 400 MG CR tablet Take 1 tablet (400 mg total) by mouth 3 (three) times daily with meals.  90 tablet 3  . sulfamethoxazole-trimethoprim (BACTRIM DS) 800-160 MG tablet TAKE 1 TABLET BY MOUTH TWICE DAILY 60 tablet 0   No current facility-administered medications for this visit.    REVIEW OF SYSTEMS:  [X]  denotes positive finding, [ ]  denotes negative finding Cardiac  Comments:  Chest pain or chest pressure:    Shortness of breath upon exertion:    Short of breath when lying flat:    Irregular heart rhythm:        Vascular    Pain in calf, thigh, or hip brought on by ambulation:    Pain in feet at night that wakes you up from your sleep:     Blood clot in your veins:    Leg swelling:         Pulmonary    Oxygen at home:    Productive cough:     Wheezing:         Neurologic    Sudden weakness in arms or legs:     Sudden numbness in arms or legs:     Sudden onset of difficulty speaking or slurred speech:    Temporary loss of vision in one eye:     Problems with dizziness:         Gastrointestinal    Blood in stool:     Vomited blood:         Genitourinary    Burning when urinating:     Blood in urine:        Psychiatric    Major depression:         Hematologic    Bleeding problems:    Problems with blood clotting too easily:        Skin    Rashes or ulcers:        Constitutional    Fever or chills:      PHYSICAL EXAM: Vitals:   02/16/20 1543  BP: (!) 186/89  Pulse: 96  Resp: 14  Temp: 98 F (36.7 C)  TempSrc: Temporal  SpO2: 96%  Weight: 242 lb (109.8 kg)  Height: 5\' 4"  (1.626 m)    GENERAL: The patient is a well-nourished female, in no acute distress. The vital signs are documented above. CARDIAC: There is a regular rate and rhythm.  VASCULAR:  DP PT signals brisk in right foot with doppler Dorsal wound is pictured below       DATA:   Right lower extremity arterial duplex today shows some moderate stenosis in the distal common femoral as well as in the SFA but no critical high-grade stenosis.  Assessment/Plan:  54 year old  female status post right leg intervention with SFA atherectomy angioplasty and stent placement as well as above-knee popliteal angioplasty for a wound.  Her wound is pictured above and is very clean based and she feels this is making progress.  She does have evidence of some residual moderate stenosis on right leg  duplex in the distal CFA/SFA but given evidence of healing and the fact that she has very brisk Doppler signals at the ankle I do not think she needs any additional intervention at this time.  I will see her in 1 month for continued wound check and we can re-evaluate any additional intervention pending wound progress.   Cephus Shelling, MD [facility] Office: [phone]

## 2020-02-16 NOTE — Telephone Encounter (Signed)
Do you want to refill? 

## 2020-02-18 ENCOUNTER — Ambulatory Visit: Payer: BC Managed Care – PPO | Admitting: Physician Assistant

## 2020-02-25 ENCOUNTER — Ambulatory Visit (INDEPENDENT_AMBULATORY_CARE_PROVIDER_SITE_OTHER): Payer: BC Managed Care – PPO | Admitting: Physician Assistant

## 2020-02-25 ENCOUNTER — Encounter: Payer: Self-pay | Admitting: Orthopedic Surgery

## 2020-02-25 DIAGNOSIS — S80821A Blister (nonthermal), right lower leg, initial encounter: Secondary | ICD-10-CM

## 2020-02-25 DIAGNOSIS — S80829A Blister (nonthermal), unspecified lower leg, initial encounter: Secondary | ICD-10-CM

## 2020-02-25 NOTE — Progress Notes (Signed)
Office Visit Note   Patient: Kayla Schmitt           Date of Birth: Jan 18, 1966           MRN: [id number] Visit Date: 02/25/2020              Requested by: Medicine, [facility] Internal [address] [place],  [place] [address] PCP: Medicine, [facility] Internal  Chief Complaint  Patient presents with  . f/u foot wound      HPI: This is a pleasant 54 year old woman who: Follows up today on her right dorsal foot wound.  She still having some drainage but she feels like it is getting smaller and less deep.  She denies any pain.  She has improved her glycemic control and her most recent hemoglobin A1c per her report is 7.5 she is using Santyl dressing changes  Assessment & Plan: Visit Diagnoses: No diagnosis found.  Plan: Continue with dressing changes follow-up in 2 weeks.  Follow-Up Instructions: No follow-ups on file.   Ortho Exam  Patient is alert, oriented, no adenopathy, well-dressed, normal affect, normal respiratory effort. Focused examination wound measures 2.6 cm x 2.7 cm and is a half a centimeter deep.  There is some epithelialization.  There is mostly fibrinous tissue at the base of the wound with some 10 to 20% of vascular fibrinous tissue.  No foul odor no surrounding cellulitis  Imaging: No results found. No images are attached to the encounter.  Labs: Lab Results  Component Value Date   HGBA1C 11.0 (H) 01/11/2020   ESRSEDRATE 136 (H) 01/14/2020   ESRSEDRATE 126 (H) 01/13/2020   ESRSEDRATE 125 (H) 01/12/2020   CRP 11.3 (H) 01/14/2020   CRP 13.0 (H) 01/13/2020   CRP 15.8 (H) 01/12/2020   REPTSTATUS 01/16/2020 FINAL 01/11/2020   CULT  01/11/2020    NO GROWTH 5 DAYS Performed at [facility], [address], [place], [place] [address]      Lab Results  Component Value Date   ALBUMIN 2.7 (L) 01/15/2020   ALBUMIN 2.7 (L) 01/14/2020   ALBUMIN 3.0 (L) 01/12/2020    Lab Results  Component Value Date   MG 1.4 (L) 01/13/2020   No results found for: VD25OH  No  results found for: PREALBUMIN CBC EXTENDED Latest Ref Rng & Units 01/15/2020 01/14/2020 01/13/2020  WBC 4.0 - 10.5 K/uL 7.7 6.1 6.8  RBC 3.87 - 5.11 MIL/uL 3.09(L) 3.12(L) 3.13(L)  HGB 12.0 - 15.0 g/dL 8.3(A) 9.1(B) 1.6(O)  HCT 36 - 46 % 28.0(L) 28.5(L) 28.7(L)  PLT 150 - 400 K/uL 269 248 224  NEUTROABS 1.7 - 7.7 K/uL - 3.6 -  LYMPHSABS 0.7 - 4.0 K/uL - 1.8 -     There is no height or weight on file to calculate BMI.  Orders:  No orders of the defined types were placed in this encounter.  No orders of the defined types were placed in this encounter.    Procedures: No procedures performed  Clinical Data: No additional findings.  ROS:  All other systems negative, except as noted in the HPI. Review of Systems  Objective: Vital Signs: There were no vitals taken for this visit.  Specialty Comments:  No specialty comments available.  PMFS History: Patient Active Problem List   Diagnosis Date Noted  . DM type 2 (diabetes mellitus, type 2) (HCC) 01/28/2020  . Hyperlipidemia 01/28/2020  . Hypertension 01/28/2020  . Hypothyroidism 01/28/2020  . Obesity 01/28/2020  . Ulcer of right foot with fat  layer exposed (HCC)   . Cellulitis of right foot   . Peripheral arterial disease (HCC)   . Cellulitis 01/11/2020  . Blister of leg 01/06/2020  . 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/28/2017  . Chest pain 10/28/2017  . GERD (gastroesophageal reflux disease) 10/28/2017   Past Medical History:  Diagnosis Date  . Diabetes mellitus without complication (HCC)   . Hypercholesteremia   . Hypertension   . Thyroid disease     Family History  Problem Relation Age of Onset  . Diabetes Other   . CAD Other     Past Surgical History:  Procedure Laterality Date  . ABDOMINAL AORTOGRAM W/LOWER EXTREMITY Bilateral 01/14/2020   Procedure: ABDOMINAL AORTOGRAM W/LOWER EXTREMITY;  Surgeon: Cephus Shelling, MD;  Location: [facility] INVASIVE CV LAB;  Service: Cardiovascular;  Laterality:  Bilateral;  . AMPUTATION TOE    . PERIPHERAL VASCULAR ATHERECTOMY Right 01/14/2020   Procedure: PERIPHERAL VASCULAR ATHERECTOMY;  Surgeon: Cephus Shelling, MD;  Location: [facility] INVASIVE CV LAB;  Service: Cardiovascular;  Laterality: Right;  sfa  . PERIPHERAL VASCULAR INTERVENTION Right 01/14/2020   Procedure: PERIPHERAL VASCULAR INTERVENTION;  Surgeon: Cephus Shelling, MD;  Location: [facility] INVASIVE CV LAB;  Service: Cardiovascular;  Laterality: Right;  sfa stent    Social History   Occupational History  . Not on file  Tobacco Use  . Smoking status: Former Games developer  . Smokeless tobacco: Never Used  Substance and Sexual Activity  . Alcohol use: Not Currently  . Drug use: Yes    Types: Marijuana    Comment: occ  . Sexual activity: Not on file

## 2020-03-11 ENCOUNTER — Encounter: Payer: Self-pay | Admitting: Physician Assistant

## 2020-03-11 ENCOUNTER — Ambulatory Visit (INDEPENDENT_AMBULATORY_CARE_PROVIDER_SITE_OTHER): Payer: BC Managed Care – PPO | Admitting: Physician Assistant

## 2020-03-11 DIAGNOSIS — L97512 Non-pressure chronic ulcer of other part of right foot with fat layer exposed: Secondary | ICD-10-CM

## 2020-03-11 NOTE — Progress Notes (Signed)
Office Visit Note   Patient: Kayla Schmitt           Date of Birth: 08-22-1965           MRN: [id number] Visit Date: 03/11/2020              Requested by: Medicine, [facility] Internal [address] [place],  [place] [address] PCP: Medicine, [facility] Internal  No chief complaint on file.     HPI: Patient is a very pleasant 54 year old woman who is a diabetic and has recently undergone revascularization to her right lower extremity.  She is been followed for a ulcer over the dorsum of her foot just proximal to the first MTP joint.  She has been doing daily dressing changes.  She is here for follow-up.  She has no complaints other than lengthy time of healing her most recent hemoglobin A1c was 7.5  Assessment & Plan: Visit Diagnoses: No diagnosis found.  Plan: Patient had a discussion with Dr. Lajoyce Corners today she does have some exposed tendon that has degenerated.  Specifically her extensor houses longus.  He does recommend at this point given the depth of the wound and the prolonged course skin grafting.  She understands that she may lose some upward extension of her toe which she has very little now.  Reviewed the risks of surgery she would like to go forward this would be an outpatient procedure she was measured today for a double XL compression vive compression stocking  Follow-Up Instructions: No follow-ups on file.   Ortho Exam  Patient is al we also discussed placing her in compression stockings.  She measured for a double XL ert, oriented, no adenopathy, well-dressed, normal affect, normal respiratory effort.  Focused examination of her right foot she has palpable pulses.  No cellulitis no foul odor.  Her ulcer measures approximately 2.5 x 2.4 cm and is approximately a centimeter deep.  She does have good epithelialization around the edges but has fibrinous and exposed degenerative EHL tendon.   Imaging: No results found. No images are attached to the encounter.  Labs: Lab Results    Component Value Date   HGBA1C 11.0 (H) 01/11/2020   ESRSEDRATE 136 (H) 01/14/2020   ESRSEDRATE 126 (H) 01/13/2020   ESRSEDRATE 125 (H) 01/12/2020   CRP 11.3 (H) 01/14/2020   CRP 13.0 (H) 01/13/2020   CRP 15.8 (H) 01/12/2020   REPTSTATUS 01/16/2020 FINAL 01/11/2020   CULT  01/11/2020    NO GROWTH 5 DAYS Performed at [facility], [address], [place], [place] [address]      Lab Results  Component Value Date   ALBUMIN 2.7 (L) 01/15/2020   ALBUMIN 2.7 (L) 01/14/2020   ALBUMIN 3.0 (L) 01/12/2020    Lab Results  Component Value Date   MG 1.4 (L) 01/13/2020   No results found for: VD25OH  No results found for: PREALBUMIN CBC EXTENDED Latest Ref Rng & Units 01/15/2020 01/14/2020 01/13/2020  WBC 4.0 - 10.5 K/uL 7.7 6.1 6.8  RBC 3.87 - 5.11 MIL/uL 3.09(L) 3.12(L) 3.13(L)  HGB 12.0 - 15.0 g/dL 5.0(D) 3.2(I) 7.1(I)  HCT 36 - 46 % 28.0(L) 28.5(L) 28.7(L)  PLT 150 - 400 K/uL 269 248 224  NEUTROABS 1.7 - 7.7 K/uL - 3.6 -  LYMPHSABS 0.7 - 4.0 K/uL - 1.8 -     There is no height or weight on file to calculate BMI.  Orders:  No orders of the defined types were placed in this encounter.  No  orders of the defined types were placed in this encounter.    Procedures: No procedures performed  Clinical Data: No additional findings.  ROS:  All other systems negative, except as noted in the HPI. Review of Systems  Objective: Vital Signs: There were no vitals taken for this visit.  Specialty Comments:  No specialty comments available.  PMFS History: Patient Active Problem List   Diagnosis Date Noted  . DM type 2 (diabetes mellitus, type 2) (HCC) 01/28/2020  . Hyperlipidemia 01/28/2020  . Hypertension 01/28/2020  . Hypothyroidism 01/28/2020  . Obesity 01/28/2020  . Ulcer of right foot with fat layer exposed (HCC)   . Cellulitis of right foot   . Peripheral arterial disease (HCC)   . Cellulitis 01/11/2020  . Blister of leg 01/06/2020  . Carpal tunnel syndrome of  right wrist 10/28/2017  . Chest pain 10/28/2017  . GERD (gastroesophageal reflux disease) 10/28/2017   Past Medical History:  Diagnosis Date  . Diabetes mellitus without complication (HCC)   . Hypercholesteremia   . Hypertension   . Thyroid disease     Family History  Problem Relation Age of Onset  . Diabetes Other   . CAD Other     Past Surgical History:  Procedure Laterality Date  . ABDOMINAL AORTOGRAM W/LOWER EXTREMITY Bilateral 01/14/2020   Procedure: ABDOMINAL AORTOGRAM W/LOWER EXTREMITY;  Surgeon: Cephus Shelling, MD;  Location: [facility] INVASIVE CV LAB;  Service: Cardiovascular;  Laterality: Bilateral;  . AMPUTATION TOE    . PERIPHERAL VASCULAR ATHERECTOMY Right 01/14/2020   Procedure: PERIPHERAL VASCULAR ATHERECTOMY;  Surgeon: Cephus Shelling, MD;  Location: [facility] INVASIVE CV LAB;  Service: Cardiovascular;  Laterality: Right;  sfa  . PERIPHERAL VASCULAR INTERVENTION Right 01/14/2020   Procedure: PERIPHERAL VASCULAR INTERVENTION;  Surgeon: Cephus Shelling, MD;  Location: [facility] INVASIVE CV LAB;  Service: Cardiovascular;  Laterality: Right;  sfa stent    Social History   Occupational History  . Not on file  Tobacco Use  . Smoking status: Former Games developer  . Smokeless tobacco: Never Used  Substance and Sexual Activity  . Alcohol use: Not Currently  . Drug use: Yes    Types: Marijuana    Comment: occ  . Sexual activity: Not on file

## 2020-03-15 ENCOUNTER — Other Ambulatory Visit: Payer: Self-pay

## 2020-03-15 ENCOUNTER — Other Ambulatory Visit: Payer: Self-pay | Admitting: Physician Assistant

## 2020-03-15 NOTE — Progress Notes (Signed)
I spoke with Kayla Kayla Schmitt, who said that he has not heard from Dr. Audrie Lia office that she is scheduled for surgery tomorrow.  I keep getting calls from Kayla Schmitt folks, "I called Kayla Schmitt at Dr. Audrie Lia office yesterday and today and left message inquiring about the plan and no one has called me back., Kayla Schmitt reported."  Kayla Schmitt said that she" is not prepared to have surgery tomorrow and that she would like to rescheduled something that I did not know was scheduled.'

## 2020-03-16 ENCOUNTER — Other Ambulatory Visit: Payer: Self-pay

## 2020-03-16 ENCOUNTER — Encounter (HOSPITAL_COMMUNITY): Payer: Self-pay | Admitting: Orthopedic Surgery

## 2020-03-16 NOTE — Progress Notes (Signed)
Covid test on DOS.  Patient states she is unable to get covid test prior to surgery.

## 2020-03-16 NOTE — Progress Notes (Signed)
PCP - Ophelia Charter, PA Cardiologist - n/a  Chest x-ray - n/a EKG - DOS 03/18/20 Stress Test - n/a ECHO - 01/12/20 Cardiac Cath - n/a  Fasting Blood Sugar - 130-150s Checks Blood Sugar 1-2/week   . Do not take oral diabetes medicines (metformin) the morning of surgery.  . THE NIGHT BEFORE SURGERY, do no take Tresiba insulin.      . If your blood sugar is less than 70 mg/dL, you will need to treat for low blood sugar: o Treat a low blood sugar (less than 70 mg/dL) with  cup of clear juice (cranberry or apple), 4 glucose tablets, OR glucose gel. o Recheck blood sugar in 15 minutes after treatment (to make sure it is greater than 70 mg/dL). If your blood sugar is not greater than 70 mg/dL on recheck, call [phone] for further instructions.  Blood Thinner Instructions:    Follow your surgeon's instructions on when to stop plavix prior to surgery.  Aspirin Instructions: Follow your surgeon's instructions on when to stop aspirin prior to surgery,  If no instructions were given by your surgeon then you will need to call the office for those instructions.  ERAS: Clears til 11 am DOS.  Anesthesia review: Yes  STOP now taking any Aspirin (unless otherwise instructed by your surgeon), Aleve, Naproxen, Ibuprofen, Motrin, Advil, Goody's, BC's, all herbal medications, fish oil, and all vitamins.   Coronavirus Screening Covid test on DOS. Do you have any of the following symptoms:  Cough yes/no: No Fever (>100.76F)  yes/no: No Runny nose yes/no: No Sore throat yes/no: No Difficulty breathing/shortness of breath  yes/no: No  Have you traveled in the last 14 days and where? yes/no: No  Patient verbalized understanding of instructions that were given via phone.

## 2020-03-18 ENCOUNTER — Other Ambulatory Visit: Payer: Self-pay

## 2020-03-18 ENCOUNTER — Encounter (HOSPITAL_COMMUNITY): Payer: Self-pay | Admitting: Orthopedic Surgery

## 2020-03-18 ENCOUNTER — Ambulatory Visit (HOSPITAL_COMMUNITY)
Admission: RE | Admit: 2020-03-18 | Discharge: 2020-03-18 | Disposition: A | Discharge status: Home / Self Care | Payer: BC Managed Care – PPO | Attending: Orthopedic Surgery | Admitting: Orthopedic Surgery

## 2020-03-18 ENCOUNTER — Encounter (HOSPITAL_COMMUNITY)
Admission: RE | Disposition: A | Discharge status: Home / Self Care | Payer: Self-pay | Source: Home / Self Care | Attending: Orthopedic Surgery

## 2020-03-18 ENCOUNTER — Ambulatory Visit (HOSPITAL_COMMUNITY): Payer: BC Managed Care – PPO | Admitting: Physician Assistant

## 2020-03-18 DIAGNOSIS — E1151 Type 2 diabetes mellitus with diabetic peripheral angiopathy without gangrene: Secondary | ICD-10-CM | POA: Insufficient documentation

## 2020-03-18 DIAGNOSIS — L97511 Non-pressure chronic ulcer of other part of right foot limited to breakdown of skin: Secondary | ICD-10-CM

## 2020-03-18 DIAGNOSIS — I10 Essential (primary) hypertension: Secondary | ICD-10-CM | POA: Diagnosis not present

## 2020-03-18 DIAGNOSIS — Z20822 Contact with and (suspected) exposure to covid-19: Secondary | ICD-10-CM | POA: Diagnosis not present

## 2020-03-18 DIAGNOSIS — E11621 Type 2 diabetes mellitus with foot ulcer: Secondary | ICD-10-CM | POA: Insufficient documentation

## 2020-03-18 DIAGNOSIS — Z87891 Personal history of nicotine dependence: Secondary | ICD-10-CM | POA: Insufficient documentation

## 2020-03-18 DIAGNOSIS — Z880 Allergy status to penicillin: Secondary | ICD-10-CM | POA: Insufficient documentation

## 2020-03-18 DIAGNOSIS — Z833 Family history of diabetes mellitus: Secondary | ICD-10-CM | POA: Diagnosis not present

## 2020-03-18 DIAGNOSIS — E7800 Pure hypercholesterolemia, unspecified: Secondary | ICD-10-CM | POA: Insufficient documentation

## 2020-03-18 DIAGNOSIS — I4510 Unspecified right bundle-branch block: Secondary | ICD-10-CM | POA: Diagnosis not present

## 2020-03-18 DIAGNOSIS — Z881 Allergy status to other antibiotic agents status: Secondary | ICD-10-CM | POA: Diagnosis not present

## 2020-03-18 DIAGNOSIS — L03115 Cellulitis of right lower limb: Secondary | ICD-10-CM

## 2020-03-18 DIAGNOSIS — L97419 Non-pressure chronic ulcer of right heel and midfoot with unspecified severity: Secondary | ICD-10-CM | POA: Diagnosis not present

## 2020-03-18 DIAGNOSIS — Z8249 Family history of ischemic heart disease and other diseases of the circulatory system: Secondary | ICD-10-CM | POA: Diagnosis not present

## 2020-03-18 DIAGNOSIS — K219 Gastro-esophageal reflux disease without esophagitis: Secondary | ICD-10-CM | POA: Diagnosis not present

## 2020-03-18 HISTORY — DX: Hypothyroidism, unspecified: E03.9

## 2020-03-18 HISTORY — DX: Gastro-esophageal reflux disease without esophagitis: K21.9

## 2020-03-18 HISTORY — DX: Unspecified osteoarthritis, unspecified site: M19.90

## 2020-03-18 HISTORY — DX: Other specified postprocedural states: Z98.890

## 2020-03-18 HISTORY — PX: SKIN SPLIT GRAFT: SHX444

## 2020-03-18 HISTORY — DX: Peripheral vascular disease, unspecified: I73.9

## 2020-03-18 HISTORY — DX: Other specified postprocedural states: R11.2

## 2020-03-18 LAB — BASIC METABOLIC PANEL
Anion gap: 8 (ref 5–15)
BUN: 26 mg/dL — ABNORMAL HIGH (ref 6–20)
CO2: 25 mmol/L (ref 22–32)
Calcium: 9.6 mg/dL (ref 8.9–10.3)
Chloride: 105 mmol/L (ref 98–111)
Creatinine, Ser: 0.91 mg/dL (ref 0.44–1.00)
GFR, Estimated: >60 mL/min (ref >60)
Glucose, Bld: 192 mg/dL — ABNORMAL HIGH (ref 70–99)
Potassium: 4.2 mmol/L (ref 3.5–5.1)
Sodium: 138 mmol/L (ref 135–145)

## 2020-03-18 LAB — GLUCOSE, CAPILLARY
Glucose-Capillary: 178 mg/dL — ABNORMAL HIGH (ref 70–99)
Glucose-Capillary: 161 mg/dL — ABNORMAL HIGH (ref 70–99)
Glucose-Capillary: 165 mg/dL — ABNORMAL HIGH (ref 70–99)

## 2020-03-18 LAB — CBC
HCT: 31.3 % — ABNORMAL LOW (ref 36.0–46.0)
Hemoglobin: 10 g/dL — ABNORMAL LOW (ref 12.0–15.0)
MCH: 28.8 pg (ref 26.0–34.0)
MCHC: 31.9 g/dL (ref 30.0–36.0)
MCV: 90.2 fL (ref 80.0–100.0)
Platelets: 203 10*3/uL (ref 150–400)
RBC: 3.47 MIL/uL — ABNORMAL LOW (ref 3.87–5.11)
RDW: 13.8 % (ref 11.5–15.5)
WBC: 4.6 10*3/uL (ref 4.0–10.5)
nRBC: 0 % (ref 0.0–0.2)

## 2020-03-18 LAB — SARS CORONAVIRUS 2 BY RT PCR (HOSPITAL ORDER, PERFORMED IN ~~LOC~~ HOSPITAL LAB): SARS Coronavirus 2: NEGATIVE

## 2020-03-18 SURGERY — APPLICATION, GRAFT, SKIN, SPLIT-THICKNESS
Anesthesia: Monitor Anesthesia Care | Laterality: Right

## 2020-03-18 MED ORDER — ORAL CARE MOUTH RINSE: 15.0000 mL | Freq: Once | OROMUCOSAL | Status: AC

## 2020-03-18 MED ORDER — MIDAZOLAM HCL 2 MG/2ML IJ SOLN
INTRAMUSCULAR | Status: AC
  Filled 2020-03-18: qty 2

## 2020-03-18 MED ORDER — ONDANSETRON HCL 4 MG/2ML IJ SOLN
INTRAMUSCULAR | Status: DC | PRN
  Administered 2020-03-18: 4 mg via INTRAVENOUS

## 2020-03-18 MED ORDER — LACTATED RINGERS IV SOLN: INTRAVENOUS | Status: DC

## 2020-03-18 MED ORDER — PROPOFOL 10 MG/ML IV BOLUS
INTRAVENOUS | Status: DC | PRN
  Administered 2020-03-18 (×3): 10 mg via INTRAVENOUS
  Administered 2020-03-18: 20 mg via INTRAVENOUS
  Administered 2020-03-18: 10 mg via INTRAVENOUS

## 2020-03-18 MED ORDER — DEXMEDETOMIDINE (PRECEDEX) IN NS 20 MCG/5ML (4 MCG/ML) IV SYRINGE
PREFILLED_SYRINGE | INTRAVENOUS | Status: AC
  Filled 2020-03-18: qty 5

## 2020-03-18 MED ORDER — AMISULPRIDE (ANTIEMETIC) 5 MG/2ML IV SOLN: 10.0000 mg | Freq: Once | INTRAVENOUS | Status: DC | PRN

## 2020-03-18 MED ORDER — 0.9 % SODIUM CHLORIDE (POUR BTL) OPTIME
TOPICAL | Status: DC | PRN
  Administered 2020-03-18: 1000 mL

## 2020-03-18 MED ORDER — OXYCODONE-ACETAMINOPHEN 5-325 MG PO TABS: 1.0000 | ORAL_TABLET | ORAL | 0 refills | Status: AC | PRN

## 2020-03-18 MED ORDER — KETOROLAC TROMETHAMINE 30 MG/ML IJ SOLN
INTRAMUSCULAR | Status: AC
  Filled 2020-03-18: qty 1

## 2020-03-18 MED ORDER — ACETAMINOPHEN 10 MG/ML IV SOLN: 1000.0000 mg | Freq: Once | INTRAVENOUS | Status: DC | PRN

## 2020-03-18 MED ORDER — CLINDAMYCIN PHOSPHATE 900 MG/50ML IV SOLN
900.0000 mg | INTRAVENOUS | Status: AC
  Administered 2020-03-18: 900 mg via INTRAVENOUS
  Filled 2020-03-18: qty 50

## 2020-03-18 MED ORDER — MEPERIDINE HCL 25 MG/ML IJ SOLN: 6.2500 mg | INTRAMUSCULAR | Status: DC | PRN

## 2020-03-18 MED ORDER — PROPOFOL 10 MG/ML IV BOLUS
INTRAVENOUS | Status: AC
  Filled 2020-03-18: qty 40

## 2020-03-18 MED ORDER — CHLORHEXIDINE GLUCONATE 0.12 % MT SOLN: 15.0000 mL | Freq: Once | OROMUCOSAL | Status: AC

## 2020-03-18 MED ORDER — FENTANYL CITRATE (PF) 100 MCG/2ML IJ SOLN: 25.0000 ug | INTRAMUSCULAR | Status: DC | PRN

## 2020-03-18 MED ORDER — FENTANYL CITRATE (PF) 250 MCG/5ML IJ SOLN
INTRAMUSCULAR | Status: AC
  Filled 2020-03-18: qty 5

## 2020-03-18 MED ORDER — CHLORHEXIDINE GLUCONATE 0.12 % MT SOLN
OROMUCOSAL | Status: AC
  Administered 2020-03-18: 15 mL via OROMUCOSAL
  Filled 2020-03-18: qty 15

## 2020-03-18 MED ORDER — FENTANYL CITRATE (PF) 100 MCG/2ML IJ SOLN
INTRAMUSCULAR | Status: DC | PRN
  Administered 2020-03-18: 50 ug via INTRAVENOUS

## 2020-03-18 MED ORDER — ACETAMINOPHEN 160 MG/5ML PO SOLN: 325.0000 mg | Freq: Once | ORAL | Status: DC | PRN

## 2020-03-18 MED ORDER — KETOROLAC TROMETHAMINE 30 MG/ML IJ SOLN
INTRAMUSCULAR | Status: DC | PRN
  Administered 2020-03-18: 30 mg via INTRAVENOUS

## 2020-03-18 MED ORDER — ACETAMINOPHEN 325 MG PO TABS: 325.0000 mg | ORAL_TABLET | Freq: Once | ORAL | Status: DC | PRN

## 2020-03-18 MED ORDER — MIDAZOLAM HCL 5 MG/5ML IJ SOLN
INTRAMUSCULAR | Status: DC | PRN
  Administered 2020-03-18: 2 mg via INTRAVENOUS

## 2020-03-18 MED ORDER — DEXMEDETOMIDINE (PRECEDEX) IN NS 20 MCG/5ML (4 MCG/ML) IV SYRINGE
PREFILLED_SYRINGE | INTRAVENOUS | Status: DC | PRN
  Administered 2020-03-18 (×4): 4 ug via INTRAVENOUS

## 2020-03-18 MED ORDER — ONDANSETRON HCL 4 MG/2ML IJ SOLN
INTRAMUSCULAR | Status: AC
  Filled 2020-03-18: qty 2

## 2020-03-18 SURGICAL SUPPLY — 43 items
BNDG CMPR 9X4 STRL LF SNTH (GAUZE/BANDAGES/DRESSINGS) ×1
BNDG COHESIVE 6X5 TAN STRL LF (GAUZE/BANDAGES/DRESSINGS) ×2 IMPLANT
BNDG ESMARK 4X9 LF (GAUZE/BANDAGES/DRESSINGS) ×3 IMPLANT
BNDG GAUZE ELAST 4 BULKY (GAUZE/BANDAGES/DRESSINGS) IMPLANT
COVER SURGICAL LIGHT HANDLE (MISCELLANEOUS) ×6 IMPLANT
COVER WAND RF STERILE (DRAPES) ×3 IMPLANT
CUFF TOURN SGL QUICK 18X4 (TOURNIQUET CUFF) IMPLANT
CUFF TOURN SGL QUICK 24 (TOURNIQUET CUFF)
CUFF TRNQT CYL 24X4X16.5-23 (TOURNIQUET CUFF) IMPLANT
DRAPE U-SHAPE 47X51 STRL (DRAPES) ×3 IMPLANT
DRESSING PREVENA PLUS CUSTOM (GAUZE/BANDAGES/DRESSINGS) IMPLANT
DRESSING VERAFLO CLEANSE CC (GAUZE/BANDAGES/DRESSINGS) IMPLANT
DRSG ADAPTIC 3X8 NADH LF (GAUZE/BANDAGES/DRESSINGS) ×2 IMPLANT
DRSG MEPITEL 4X7.2 (GAUZE/BANDAGES/DRESSINGS) ×3 IMPLANT
DRSG PREVENA PLUS CUSTOM (GAUZE/BANDAGES/DRESSINGS) ×3
DRSG VERAFLO CLEANSE CC (GAUZE/BANDAGES/DRESSINGS) ×3
DURAPREP 26ML APPLICATOR (WOUND CARE) ×3 IMPLANT
ELECT REM PT RETURN 9FT ADLT (ELECTROSURGICAL) ×3
ELECTRODE REM PT RTRN 9FT ADLT (ELECTROSURGICAL) ×1 IMPLANT
GAUZE SPONGE 4X4 12PLY STRL (GAUZE/BANDAGES/DRESSINGS) ×2 IMPLANT
GLOVE BIOGEL PI IND STRL 9 (GLOVE) ×1 IMPLANT
GLOVE BIOGEL PI INDICATOR 9 (GLOVE) ×2
GLOVE SURG ORTHO 9.0 STRL STRW (GLOVE) ×3 IMPLANT
GOWN STRL REUS W/ TWL XL LVL3 (GOWN DISPOSABLE) ×2 IMPLANT
GOWN STRL REUS W/TWL XL LVL3 (GOWN DISPOSABLE) ×6
GRAFT SKIN WND OMEGA3 3X7 (Tissue) ×1 IMPLANT
KIT BASIN OR (CUSTOM PROCEDURE TRAY) ×3 IMPLANT
KIT TURNOVER KIT B (KITS) ×3 IMPLANT
MANIFOLD NEPTUNE II (INSTRUMENTS) ×3 IMPLANT
NDL HYPO 25GX1X1/2 BEV (NEEDLE) IMPLANT
NEEDLE HYPO 25GX1X1/2 BEV (NEEDLE) IMPLANT
NS IRRIG 1000ML POUR BTL (IV SOLUTION) ×3 IMPLANT
PACK ORTHO EXTREMITY (CUSTOM PROCEDURE TRAY) ×3 IMPLANT
PAD ARMBOARD 7.5X6 YLW CONV (MISCELLANEOUS) ×6 IMPLANT
SKIN WOUND KERECIS OMEGA3 3X7 (Tissue) ×1 IMPLANT
SUCTION FRAZIER HANDLE 10FR (MISCELLANEOUS)
SUCTION TUBE FRAZIER 10FR DISP (MISCELLANEOUS) IMPLANT
SYR CONTROL 10ML LL (SYRINGE) IMPLANT
TOWEL GREEN STERILE (TOWEL DISPOSABLE) ×3 IMPLANT
TOWEL GREEN STERILE FF (TOWEL DISPOSABLE) ×3 IMPLANT
TUBE CONNECTING 12'X1/4 (SUCTIONS)
TUBE CONNECTING 12X1/4 (SUCTIONS) IMPLANT
WATER STERILE IRR 1000ML POUR (IV SOLUTION) ×3 IMPLANT

## 2020-03-18 NOTE — Anesthesia Postprocedure Evaluation (Signed)
Anesthesia Post Note  Patient: Kayla Schmitt  Procedure(s) Performed: SKIN GRAFTING RIGHT FOOT ULCER (Right )     Patient location during evaluation: PACU Anesthesia Type: MAC Level of consciousness: awake and alert Pain management: pain level controlled Vital Signs Assessment: post-procedure vital signs reviewed and stable Respiratory status: spontaneous breathing, nonlabored ventilation, respiratory function stable and patient connected to nasal cannula oxygen Cardiovascular status: stable and blood pressure returned to baseline Postop Assessment: no apparent nausea or vomiting Anesthetic complications: no   No complications documented.  Last Vitals:  Vitals:   03/18/20 1428 03/18/20 1441  BP: 138/61 (!) 144/67  Pulse: 69 65  Resp: 13 15  Temp:    SpO2: 98% 99%    Last Pain:  Vitals:   03/18/20 1441  TempSrc:   PainSc: 0-No pain                 Effie Berkshire

## 2020-03-18 NOTE — Progress Notes (Signed)
Pt states she has a knee scooter to use in place of crutches/ Dr Lajoyce Corners aware

## 2020-03-18 NOTE — Op Note (Signed)
03/18/2020  2:14 PM  PATIENT:  Kayla Schmitt    PRE-OPERATIVE DIAGNOSIS:  Right Foot Ulcer  POST-OPERATIVE DIAGNOSIS:  Same  PROCEDURE:  SKIN GRAFTING RIGHT FOOT ULCER  SURGEON:  Nadara Mustard, MD  PHYSICIAN ASSISTANT:None ANESTHESIA:   General  PREOPERATIVE INDICATIONS:  Kayla Schmitt is a  54 y.o. female with a diagnosis of Right Foot Ulcer who failed conservative measures and elected for surgical management.    The risks benefits and alternatives were discussed with the patient preoperatively including but not limited to the risks of infection, bleeding, nerve injury, cardiopulmonary complications, the need for revision surgery, among others, and the patient was willing to proceed.  OPERATIVE IMPLANTS: Kerecis skin graft 2x3 cm  @ENCIMAGES @  OPERATIVE FINDINGS: Minimal petechial bleeding right foot.  OPERATIVE PROCEDURE: Patient was brought to the operating room and underwent a general anesthetic.  After adequate levels anesthesia were obtained patient's right lower extremity was prepped using DuraPrep draped into a sterile field a timeout was called.  Patient had a ischemic ulcer of the dorsum of the right foot she is undergone revascularization with vascular surgery she is undergone conservative wound care without resolution and presents at this time for skin graft.  The risk and benefits were discussed patient states she understands wished to proceed with surgery at this time.  Patient had sharp debridement of the ulcer with a 21 blade knife the skin soft tissue and EHL tendon were debrided further debridement was performed with a rondure.  This was an excisional debridement.  The wound was irrigated with normal saline there was minimal petechial bleeding.  The Kerecis skin graft was applied this was secured with staples a double layer of the graft was applied.  This was covered with the cleanse choice wound VAC this was overwrapped with Covan this had a good  suction fit patient was taken the PACU in stable condition   DISCHARGE PLANNING:  Antibiotic duration: Preoperative antibiotics  Weightbearing: Touchdown weightbearing postoperative shoe  Pain medication: Percocet  Dressing care/ Wound VAC: Praveena wound VAC  Ambulatory devices: Walker  Discharge to: Home.  Follow-up: In the office 1 week post operative.

## 2020-03-18 NOTE — Transfer of Care (Signed)
Immediate Anesthesia Transfer of Care Note  Patient: Kayla Schmitt  Procedure(s) Performed: SKIN GRAFTING RIGHT FOOT ULCER (Right )  Patient Location: PACU  Anesthesia Type:MAC  Level of Consciousness: awake, alert  and oriented  Airway & Oxygen Therapy: Patient Spontanous Breathing and Patient connected to face mask oxygen  Post-op Assessment: Report given to RN and Post -op Vital signs reviewed and stable  Post vital signs: Reviewed and stable  Last Vitals:  Vitals Value Taken Time  BP 111/63 03/18/20 1413  Temp    Pulse 69 03/18/20 1416  Resp 14 03/18/20 1416  SpO2 97 % 03/18/20 1416  Vitals shown include unvalidated device data.  Last Pain:  Vitals:   03/18/20 1415  TempSrc:   PainSc: (P) 0-No pain         Complications: No complications documented.

## 2020-03-18 NOTE — Anesthesia Preprocedure Evaluation (Signed)
Anesthesia Evaluation  Patient identified by MRN, date of birth, ID band Patient awake    Reviewed: Allergy & Precautions, NPO status , Patient's Chart, lab work & pertinent test results  History of Anesthesia Complications (+) PONV and history of anesthetic complications  Airway Mallampati: III  TM Distance: >3 FB Neck ROM: Full    Dental  (+) Dental Advisory Given, Upper Dentures   Pulmonary former smoker,    breath sounds clear to auscultation       Cardiovascular hypertension, Pt. on medications + Peripheral Vascular Disease   Rhythm:Regular Rate:Normal     Neuro/Psych  Neuromuscular disease negative psych ROS   GI/Hepatic Neg liver ROS, GERD  Medicated,  Endo/Other  diabetes, Type 2, Insulin Dependent, Oral Hypoglycemic AgentsHypothyroidism   Renal/GU      Musculoskeletal  (+) Arthritis ,   Abdominal (+) + obese,   Peds  Hematology negative hematology ROS (+)   Anesthesia Other Findings   Reproductive/Obstetrics negative OB ROS                             Anesthesia Physical Anesthesia Plan  ASA: III  Anesthesia Plan: MAC   Post-op Pain Management:    Induction: Intravenous  PONV Risk Score and Plan: 3 and Propofol infusion, Ondansetron, Midazolam and Scopolamine patch - Pre-op  Airway Management Planned: Natural Airway and Simple Face Mask  Additional Equipment: None  Intra-op Plan:   Post-operative Plan:   Informed Consent: I have reviewed the patients History and Physical, chart, labs and discussed the procedure including the risks, benefits and alternatives for the proposed anesthesia with the patient or authorized representative who has indicated his/her understanding and acceptance.       Plan Discussed with: CRNA  Anesthesia Plan Comments:         Anesthesia Quick Evaluation

## 2020-03-18 NOTE — H&P (Addendum)
Kayla Schmitt is an 54 y.o. female.   Chief Complaint: Right Foot Ulcer HPI: Patient is a very pleasant 54 year old woman who is a diabetic and has recently undergone revascularization to her right lower extremity.  She is been followed for a ulcer over the dorsum of her foot just proximal to the first MTP joint.  She has been doing daily dressing changes.  She is here for follow-up.  She has no complaints other than lengthy time of healing her most recent hemoglobin A1c was 7.5  Past Medical History:  Diagnosis Date  . Arthritis   . Diabetes mellitus without complication (HCC)    type 2  . GERD (gastroesophageal reflux disease)   . Hypercholesteremia   . Hypertension   . Hypothyroidism   . PAD (peripheral artery disease) (HCC)   . PONV (postoperative nausea and vomiting)   . Thyroid disease     Past Surgical History:  Procedure Laterality Date  . ABDOMINAL AORTOGRAM W/LOWER EXTREMITY Bilateral 01/14/2020   Procedure: ABDOMINAL AORTOGRAM W/LOWER EXTREMITY;  Surgeon: Cephus Shelling, MD;  Location: [facility] INVASIVE CV LAB;  Service: Cardiovascular;  Laterality: Bilateral;  . AMPUTATION TOE    . PERIPHERAL VASCULAR ATHERECTOMY Right 01/14/2020   Procedure: PERIPHERAL VASCULAR ATHERECTOMY;  Surgeon: Cephus Shelling, MD;  Location: [facility] INVASIVE CV LAB;  Service: Cardiovascular;  Laterality: Right;  sfa  . PERIPHERAL VASCULAR INTERVENTION Right 01/14/2020   Procedure: PERIPHERAL VASCULAR INTERVENTION;  Surgeon: Cephus Shelling, MD;  Location: [facility] INVASIVE CV LAB;  Service: Cardiovascular;  Laterality: Right;  sfa stent   . WISDOM TOOTH EXTRACTION      Family History  Problem Relation Age of Onset  . Diabetes Other   . CAD Other    Social History:  reports that she quit smoking about 3 years ago. Her smoking use included cigarettes. She has never used smokeless tobacco. She reports previous alcohol use. She reports current drug use. Drug: Marijuana.  Allergies:   Allergies  Allergen Reactions  . Doxycycline Nausea And Vomiting    extreme  . Penicillins     Did it involve swelling of the face/tongue/throat, SOB, or low BP? no Did it involve sudden or severe rash/hives, skin peeling, or any reaction on the inside of your mouth or nose? yes Did you need to seek medical attention at a hospital or doctor's office? yes When did it last happen?childhood allergy If all above answers are "NO", may proceed with cephalosporin use.     No medications prior to admission.    No results found for this or any previous visit (from the past 48 hour(s)). No results found.  Review of Systems  All other systems reviewed and are negative.   Height 5\' 4"  (1.626 m), weight 108.9 kg. Physical Exam  Patient is al we also discussed placing her in compression stockings.  She measured for a double XL ert, oriented, no adenopathy, well-dressed, normal affect, normal respiratory effort.  Focused examination of her right foot she has palpable pulses.  No cellulitis no foul odor.  Her ulcer measures approximately 2.5 x 2.4 cm and is approximately a centimeter deep.  She does have good epithelialization around the edges but has fibrinous and exposed degenerative EHL tendon.heart RRR Lungs Clear Assessment/Plan Plan: Patient had a discussion with Dr. today she does have some exposed tendon that has degenerated.  Specifically her extensor houses longus.  He does recommend at this point given the depth of the wound and the prolonged course  skin grafting.  She understands that she may lose some upward extension of her toe which she has very little now.  Reviewed the risks of surgery she would like to go forward this would be an outpatient procedure she was measured today for a double XL compression vive compression stocking [place] Rob Mciver, PA 03/18/2020, 7:14 AM

## 2020-03-21 ENCOUNTER — Encounter (HOSPITAL_COMMUNITY): Payer: Self-pay | Admitting: Orthopedic Surgery

## 2020-03-23 ENCOUNTER — Encounter: Payer: Self-pay | Admitting: Family

## 2020-03-23 ENCOUNTER — Ambulatory Visit (INDEPENDENT_AMBULATORY_CARE_PROVIDER_SITE_OTHER): Payer: BC Managed Care – PPO | Admitting: Family

## 2020-03-23 VITALS — Ht 64.0 in | Wt 240.0 lb

## 2020-03-23 DIAGNOSIS — Z945 Skin transplant status: Secondary | ICD-10-CM

## 2020-03-23 NOTE — Progress Notes (Signed)
Post-Op Visit Note   Patient: Kayla Schmitt           Date of Birth: 1966-02-25           MRN: [id number] Visit Date: 03/23/2020 PCP: [facility], [facility]  Chief Complaint:  Chief Complaint  Patient presents with  . Right Foot - Routine Post Op    03/18/20 right foot ulcer STSG     HPI:  HPI The patient is a 54 year old woman seen status post right foot skin grafting 5 days ago with for skin has been using a kneeling scooter for mobility she wonders if she can return to work for seated work  Ortho Exam Staples in place the graft material in place there is no surrounding erythema scant bloody drainage no odor erythema warmth no sign of infection  Visit Diagnoses:  1. Hx of skin graft     Plan: She will begin daily Dial soap cleansing.  Silvadene dressing changes.  Elevate for swelling she will continue to minimize weightbearing follow-up with Dr. Lajoyce Corners in 1 week  Follow-Up Instructions: Return in about 1 week (around 03/30/2020).   Imaging: No results found.  Orders:  No orders of the defined types were placed in this encounter.  No orders of the defined types were placed in this encounter.    PMFS History: Patient Active Problem List   Diagnosis Date Noted  . DM type 2 (diabetes mellitus, type 2) (HCC) 01/28/2020  . Hyperlipidemia 01/28/2020  . Hypertension 01/28/2020  . Hypothyroidism 01/28/2020  . Obesity 01/28/2020  . Ulcerated, foot, right, limited to breakdown of skin (HCC)   . Cellulitis of right foot   . Peripheral arterial disease (HCC)   . Cellulitis 01/11/2020  . Blister of leg 01/06/2020  . 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/28/2017  . Chest pain 10/28/2017  . GERD (gastroesophageal reflux disease) 10/28/2017   Past Medical History:  Diagnosis Date  . Arthritis   . Diabetes mellitus without complication (HCC)    type 2  . GERD (gastroesophageal reflux disease)   . Hypercholesteremia   . Hypertension   . Hypothyroidism   .  PAD (peripheral artery disease) (HCC)   . PONV (postoperative nausea and vomiting)   . Thyroid disease     Family History  Problem Relation Age of Onset  . Diabetes Other   . CAD Other     Past Surgical History:  Procedure Laterality Date  . ABDOMINAL AORTOGRAM W/LOWER EXTREMITY Bilateral 01/14/2020   Procedure: ABDOMINAL AORTOGRAM W/LOWER EXTREMITY;  Surgeon: Cephus Shelling, MD;  Location: [facility] INVASIVE CV LAB;  Service: Cardiovascular;  Laterality: Bilateral;  . AMPUTATION TOE    . PERIPHERAL VASCULAR ATHERECTOMY Right 01/14/2020   Procedure: PERIPHERAL VASCULAR ATHERECTOMY;  Surgeon: Cephus Shelling, MD;  Location: [facility] INVASIVE CV LAB;  Service: Cardiovascular;  Laterality: Right;  sfa  . PERIPHERAL VASCULAR INTERVENTION Right 01/14/2020   Procedure: PERIPHERAL VASCULAR INTERVENTION;  Surgeon: Cephus Shelling, MD;  Location: [facility] INVASIVE CV LAB;  Service: Cardiovascular;  Laterality: Right;  sfa stent   . SKIN SPLIT GRAFT Right 03/18/2020   Procedure: SKIN GRAFTING RIGHT FOOT ULCER;  Surgeon: Nadara Mustard, MD;  Location: [facility] OR;  Service: Orthopedics;  Laterality: Right;  . WISDOM TOOTH EXTRACTION     Social History   Occupational History  . Not on file  Tobacco Use  . Smoking status: Former Smoker    Types: Cigarettes    Quit date: 01/2017  Years since quitting: 3.1  . Smokeless tobacco: Never Used  Vaping Use  . Vaping Use: Never used  Substance and Sexual Activity  . Alcohol use: Not Currently  . Drug use: Yes    Types: Marijuana    Comment: last use 03/12/20 - uses twice a month per pt 03/16/20  . Sexual activity: Yes    Birth control/protection: Post-menopausal

## 2020-03-29 ENCOUNTER — Ambulatory Visit: Payer: BC Managed Care – PPO | Admitting: Vascular Surgery

## 2020-03-31 ENCOUNTER — Ambulatory Visit (INDEPENDENT_AMBULATORY_CARE_PROVIDER_SITE_OTHER): Payer: BC Managed Care – PPO | Admitting: Physician Assistant

## 2020-03-31 ENCOUNTER — Encounter: Payer: Self-pay | Admitting: Orthopedic Surgery

## 2020-03-31 DIAGNOSIS — L03115 Cellulitis of right lower limb: Secondary | ICD-10-CM

## 2020-03-31 NOTE — Progress Notes (Signed)
Office Visit Note   Patient: Kayla Schmitt           Date of Birth: 01/10/1966           MRN: [id number] Visit Date: 03/31/2020              Requested by: Medicine, [facility] Internal [address] [place],  [place] [address] PCP: Medicine, [facility] Internal  Chief Complaint  Patient presents with  . Right Foot - Routine Post Op    03/18/20 right foot ulcer STSG      HPI: Patient is 2 weeks status post skin grafting to her right dorsal foot ulcer.  She is doing well.  She is using her nitroglycerin patch.  She is not able to take the Trental as it caused her significant GI upset.  Assessment & Plan: Visit Diagnoses: No diagnosis found.  Plan: Patient will use Silvadene dressing changes and apply the compression sock.  Once the ulcer is even with the skin she may change over to just the sock.  Follow-up in 2 weeks.  Follow-Up Instructions: No follow-ups on file.   Ortho Exam  Patient is alert, oriented, no adenopathy, well-dressed, normal affect, normal respiratory effort. Surgical staples were removed today.  Area has some fibrinous tissue with some good epithelialization around the periphery.  No cellulitis and swelling is very well controlled  Imaging: No results found. No images are attached to the encounter.  Labs: Lab Results  Component Value Date   HGBA1C 11.0 (H) 01/11/2020   ESRSEDRATE 136 (H) 01/14/2020   ESRSEDRATE 126 (H) 01/13/2020   ESRSEDRATE 125 (H) 01/12/2020   CRP 11.3 (H) 01/14/2020   CRP 13.0 (H) 01/13/2020   CRP 15.8 (H) 01/12/2020   REPTSTATUS 01/16/2020 FINAL 01/11/2020   CULT  01/11/2020    NO GROWTH 5 DAYS Performed at [facility], [address] [place], [place] [address]      Lab Results  Component Value Date   ALBUMIN 2.7 (L) 01/15/2020   ALBUMIN 2.7 (L) 01/14/2020   ALBUMIN 3.0 (L) 01/12/2020    Lab Results  Component Value Date   MG 1.4 (L) 01/13/2020   No results found for: VD25OH  No results found for: PREALBUMIN CBC  EXTENDED Latest Ref Rng & Units 03/18/2020 01/15/2020 01/14/2020  WBC 4.0 - 10.5 K/uL 4.6 7.7 6.1  RBC 3.87 - 5.11 MIL/uL 3.47(L) 3.09(L) 3.12(L)  HGB 12.0 - 15.0 g/dL 10.0(L) 8.6(L) 9.0(L)  HCT 36 - 46 % 31.3(L) 28.0(L) 28.5(L)  PLT 150 - 400 K/uL 203 269 248  NEUTROABS 1.7 - 7.7 K/uL - - 3.6  LYMPHSABS 0.7 - 4.0 K/uL - - 1.8     There is no height or weight on file to calculate BMI.  Orders:  No orders of the defined types were placed in this encounter.  No orders of the defined types were placed in this encounter.    Procedures: No procedures performed  Clinical Data: No additional findings.  ROS:  All other systems negative, except as noted in the HPI. Review of Systems  Objective: Vital Signs: There were no vitals taken for this visit.  Specialty Comments:  No specialty comments available.  PMFS History: Patient Active Problem List   Diagnosis Date Noted  . DM type 2 (diabetes mellitus, type 2) (HCC) 01/28/2020  . Hyperlipidemia 01/28/2020  . Hypertension 01/28/2020  . Hypothyroidism 01/28/2020  . Obesity 01/28/2020  . Ulcerated, foot, right, limited to breakdown of skin (HCC)   .  Cellulitis of right foot   . Peripheral arterial disease (HCC)   . Cellulitis 01/11/2020  . Blister of leg 01/06/2020  . 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/28/2017  . Chest pain 10/28/2017  . GERD (gastroesophageal reflux disease) 10/28/2017   Past Medical History:  Diagnosis Date  . Arthritis   . Diabetes mellitus without complication (HCC)    type 2  . GERD (gastroesophageal reflux disease)   . Hypercholesteremia   . Hypertension   . Hypothyroidism   . PAD (peripheral artery disease) (HCC)   . PONV (postoperative nausea and vomiting)   . Thyroid disease     Family History  Problem Relation Age of Onset  . Diabetes Other   . CAD Other     Past Surgical History:  Procedure Laterality Date  . ABDOMINAL AORTOGRAM W/LOWER EXTREMITY Bilateral 01/14/2020    Procedure: ABDOMINAL AORTOGRAM W/LOWER EXTREMITY;  Surgeon: Cephus Shelling, MD;  Location: [facility] INVASIVE CV LAB;  Service: Cardiovascular;  Laterality: Bilateral;  . AMPUTATION TOE    . PERIPHERAL VASCULAR ATHERECTOMY Right 01/14/2020   Procedure: PERIPHERAL VASCULAR ATHERECTOMY;  Surgeon: Cephus Shelling, MD;  Location: [facility] INVASIVE CV LAB;  Service: Cardiovascular;  Laterality: Right;  sfa  . PERIPHERAL VASCULAR INTERVENTION Right 01/14/2020   Procedure: PERIPHERAL VASCULAR INTERVENTION;  Surgeon: Cephus Shelling, MD;  Location: [facility] INVASIVE CV LAB;  Service: Cardiovascular;  Laterality: Right;  sfa stent   . SKIN SPLIT GRAFT Right 03/18/2020   Procedure: SKIN GRAFTING RIGHT FOOT ULCER;  Surgeon: Nadara Mustard, MD;  Location: [facility] OR;  Service: Orthopedics;  Laterality: Right;  . WISDOM TOOTH EXTRACTION     Social History   Occupational History  . Not on file  Tobacco Use  . Smoking status: Former Smoker    Types: Cigarettes    Quit date: 01/2017    Years since quitting: 3.1  . Smokeless tobacco: Never Used  Vaping Use  . Vaping Use: Never used  Substance and Sexual Activity  . Alcohol use: Not Currently  . Drug use: Yes    Types: Marijuana    Comment: last use 03/12/20 - uses twice a month per pt 03/16/20  . Sexual activity: Yes    Birth control/protection: Post-menopausal

## 2020-04-14 ENCOUNTER — Ambulatory Visit (INDEPENDENT_AMBULATORY_CARE_PROVIDER_SITE_OTHER): Payer: BC Managed Care – PPO | Admitting: Physician Assistant

## 2020-04-14 ENCOUNTER — Encounter: Payer: Self-pay | Admitting: Orthopedic Surgery

## 2020-04-14 DIAGNOSIS — L03115 Cellulitis of right lower limb: Secondary | ICD-10-CM

## 2020-04-14 NOTE — Progress Notes (Signed)
Office Visit Note   Patient: Kayla Schmitt           Date of Birth: 16-Aug-1965           MRN: [id number] Visit Date: 04/14/2020              Requested by: Medicine, [facility] Internal [address] [place],  [place] [address] PCP: Medicine, [facility] Internal  Chief Complaint  Patient presents with  . Right Foot - Routine Post Op    03/18/20 right foot skin graft ulcer       HPI: Patient presents to me 4 weeks status post right foot skin grafting.  She is doing extremely well.  The most recent graft has been very successful in shrinking the size of the wound and we are confident she would not need more surgery.  Assessment & Plan: Visit Diagnoses: No diagnosis found.  Plan: Continue current dressing changes follow-up in 4 weeks  Follow-Up Instructions: No follow-ups on file.   Ortho Exam  Patient is alert, oriented, no adenopathy, well-dressed, normal affect, normal respiratory effort.  Focused examination wound is good healthy vascular tissue.  Measures about 3 x 3 cm.  No surrounding cellulitis no foul odor no drainage no evidence of infection Imaging: No results found. No images are attached to the encounter.  Labs: Lab Results  Component Value Date   HGBA1C 11.0 (H) 01/11/2020   ESRSEDRATE 136 (H) 01/14/2020   ESRSEDRATE 126 (H) 01/13/2020   ESRSEDRATE 125 (H) 01/12/2020   CRP 11.3 (H) 01/14/2020   CRP 13.0 (H) 01/13/2020   CRP 15.8 (H) 01/12/2020   REPTSTATUS 01/16/2020 FINAL 01/11/2020   CULT  01/11/2020    NO GROWTH 5 DAYS Performed at [facility], [address], [place], [place] [address]      Lab Results  Component Value Date   ALBUMIN 2.7 (L) 01/15/2020   ALBUMIN 2.7 (L) 01/14/2020   ALBUMIN 3.0 (L) 01/12/2020    Lab Results  Component Value Date   MG 1.4 (L) 01/13/2020   No results found for: VD25OH  No results found for: PREALBUMIN CBC EXTENDED Latest Ref Rng & Units 03/18/2020 01/15/2020 01/14/2020  WBC 4.0 - 10.5 K/uL 4.6 7.7 6.1  RBC 3.87 -  5.11 MIL/uL 3.47(L) 3.09(L) 3.12(L)  HGB 12.0 - 15.0 g/dL 10.0(L) 8.6(L) 9.0(L)  HCT 36.0 - 46.0 % 31.3(L) 28.0(L) 28.5(L)  PLT 150 - 400 K/uL 203 269 248  NEUTROABS 1.7 - 7.7 K/uL - - 3.6  LYMPHSABS 0.7 - 4.0 K/uL - - 1.8     There is no height or weight on file to calculate BMI.  Orders:  No orders of the defined types were placed in this encounter.  No orders of the defined types were placed in this encounter.    Procedures: No procedures performed  Clinical Data: No additional findings.  ROS:  All other systems negative, except as noted in the HPI. Review of Systems  Objective: Vital Signs: There were no vitals taken for this visit.  Specialty Comments:  No specialty comments available.  PMFS History: Patient Active Problem List   Diagnosis Date Noted  . DM type 2 (diabetes mellitus, type 2) (HCC) 01/28/2020  . Hyperlipidemia 01/28/2020  . Hypertension 01/28/2020  . Hypothyroidism 01/28/2020  . Obesity 01/28/2020  . Ulcerated, foot, right, limited to breakdown of skin (HCC)   . Cellulitis of right foot   . Peripheral arterial disease (HCC)   . Cellulitis 01/11/2020  . Blister of  leg 01/06/2020  . 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/28/2017  . Chest pain 10/28/2017  . GERD (gastroesophageal reflux disease) 10/28/2017   Past Medical History:  Diagnosis Date  . Arthritis   . Diabetes mellitus without complication (HCC)    type 2  . GERD (gastroesophageal reflux disease)   . Hypercholesteremia   . Hypertension   . Hypothyroidism   . PAD (peripheral artery disease) (HCC)   . PONV (postoperative nausea and vomiting)   . Thyroid disease     Family History  Problem Relation Age of Onset  . Diabetes Other   . CAD Other     Past Surgical History:  Procedure Laterality Date  . ABDOMINAL AORTOGRAM W/LOWER EXTREMITY Bilateral 01/14/2020   Procedure: ABDOMINAL AORTOGRAM W/LOWER EXTREMITY;  Surgeon: Cephus Shelling, MD;  Location: [facility] INVASIVE CV  LAB;  Service: Cardiovascular;  Laterality: Bilateral;  . AMPUTATION TOE    . PERIPHERAL VASCULAR ATHERECTOMY Right 01/14/2020   Procedure: PERIPHERAL VASCULAR ATHERECTOMY;  Surgeon: Cephus Shelling, MD;  Location: [facility] INVASIVE CV LAB;  Service: Cardiovascular;  Laterality: Right;  sfa  . PERIPHERAL VASCULAR INTERVENTION Right 01/14/2020   Procedure: PERIPHERAL VASCULAR INTERVENTION;  Surgeon: Cephus Shelling, MD;  Location: [facility] INVASIVE CV LAB;  Service: Cardiovascular;  Laterality: Right;  sfa stent   . SKIN SPLIT GRAFT Right 03/18/2020   Procedure: SKIN GRAFTING RIGHT FOOT ULCER;  Surgeon: Nadara Mustard, MD;  Location: [facility] OR;  Service: Orthopedics;  Laterality: Right;  . WISDOM TOOTH EXTRACTION     Social History   Occupational History  . Not on file  Tobacco Use  . Smoking status: Former Smoker    Types: Cigarettes    Quit date: 01/2017    Years since quitting: 3.2  . Smokeless tobacco: Never Used  Vaping Use  . Vaping Use: Never used  Substance and Sexual Activity  . Alcohol use: Not Currently  . Drug use: Yes    Types: Marijuana    Comment: last use 03/12/20 - uses twice a month per pt 03/16/20  . Sexual activity: Yes    Birth control/protection: Post-menopausal

## 2020-05-03 ENCOUNTER — Other Ambulatory Visit: Payer: Self-pay

## 2020-05-03 ENCOUNTER — Encounter: Payer: Self-pay | Admitting: Vascular Surgery

## 2020-05-03 ENCOUNTER — Ambulatory Visit (INDEPENDENT_AMBULATORY_CARE_PROVIDER_SITE_OTHER): Payer: BC Managed Care – PPO | Admitting: Vascular Surgery

## 2020-05-03 VITALS — BP 145/68 | HR 73 | Temp 97.6°F | Resp 16 | Ht 64.0 in | Wt 243.0 lb

## 2020-05-03 DIAGNOSIS — I739 Peripheral vascular disease, unspecified: Secondary | ICD-10-CM | POA: Diagnosis not present

## 2020-05-03 NOTE — Progress Notes (Signed)
Patient name: Kayla Schmitt MRN: [id number] DOB: Jul 22, 1965 Sex: female  REASON FOR VISIT: Follow-up for right foot wound check after previous intervention for critical limb ischemia with tissue loss  HPI: Kayla Schmitt is a 55 y.o. female history of hypertension, hyperlipidemia, diabetes that presents for follow-up and wound check of right foot wound.    She underwent right leg intervention on 01/14/2020 with right SFA atherectomy and angioplasty and stent placement as well as right above-knee popliteal angioplasty.    She recently underwent skin grafting with Dr. Lajoyce Corners and this has been healing.  No foot pain or new leg pain with walking.    Past Medical History:  Diagnosis Date  . Arthritis   . Diabetes mellitus without complication (HCC)    type 2  . GERD (gastroesophageal reflux disease)   . Hypercholesteremia   . Hypertension   . Hypothyroidism   . PAD (peripheral artery disease) (HCC)   . PONV (postoperative nausea and vomiting)   . Thyroid disease     Past Surgical History:  Procedure Laterality Date  . ABDOMINAL AORTOGRAM W/LOWER EXTREMITY Bilateral 01/14/2020   Procedure: ABDOMINAL AORTOGRAM W/LOWER EXTREMITY;  Surgeon: Cephus Shelling, MD;  Location: [facility] INVASIVE CV LAB;  Service: Cardiovascular;  Laterality: Bilateral;  . AMPUTATION TOE    . PERIPHERAL VASCULAR ATHERECTOMY Right 01/14/2020   Procedure: PERIPHERAL VASCULAR ATHERECTOMY;  Surgeon: Cephus Shelling, MD;  Location: [facility] INVASIVE CV LAB;  Service: Cardiovascular;  Laterality: Right;  sfa  . PERIPHERAL VASCULAR INTERVENTION Right 01/14/2020   Procedure: PERIPHERAL VASCULAR INTERVENTION;  Surgeon: Cephus Shelling, MD;  Location: [facility] INVASIVE CV LAB;  Service: Cardiovascular;  Laterality: Right;  sfa stent   . SKIN SPLIT GRAFT Right 03/18/2020   Procedure: SKIN GRAFTING RIGHT FOOT ULCER;  Surgeon: Nadara Mustard, MD;  Location: [facility] OR;  Service: Orthopedics;  Laterality: Right;  . WISDOM  TOOTH EXTRACTION      Family History  Problem Relation Age of Onset  . Diabetes Other   . CAD Other     SOCIAL HISTORY: Social History   Tobacco Use  . Smoking status: Former Smoker    Types: Cigarettes    Quit date: 01/2017    Years since quitting: 3.2  . Smokeless tobacco: Never Used  Substance Use Topics  . Alcohol use: Not Currently    Allergies  Allergen Reactions  . Penicillins Rash    Did it involve swelling of the face/tongue/throat, SOB, or low BP? no Did it involve sudden or severe rash/hives, skin peeling, or any reaction on the inside of your mouth or nose? yes Did you need to seek medical attention at a hospital or doctor's office? yes When did it last happen?childhood allergy If all above answers are "NO", may proceed with cephalosporin use.   Marland Kitchen Doxycycline Nausea And Vomiting    Extremely N/V  . Trental [Pentoxifylline] Nausea And Vomiting    Current Outpatient Medications  Medication Sig Dispense Refill  . acetaminophen (TYLENOL) 325 MG tablet Take 650 mg by mouth every 6 (six) hours as needed for mild pain.    Marland Kitchen amLODipine (NORVASC) 5 MG tablet Take 5 mg by mouth daily.    Marland Kitchen aspirin 81 MG EC tablet Take 1 tablet by mouth daily.    Marland Kitchen atorvastatin (LIPITOR) 40 MG tablet Take 40 mg by mouth daily.    . clopidogrel (PLAVIX) 75 MG tablet Take 1 tablet (75 mg total) by mouth daily with breakfast. 90 tablet 3  .  collagenase (SANTYL) ointment Apply topically daily. (Patient taking differently: Apply 1 application topically daily.) 15 g 2  . gabapentin (NEURONTIN) 100 MG capsule Take 200 mg by mouth at bedtime.    . insulin degludec (TRESIBA FLEXTOUCH) 100 UNIT/ML FlexTouch Pen Inject 10 Units into the skin at bedtime. (Patient taking differently: Inject 14 Units into the skin at bedtime.) 3 mL 3  . levothyroxine (SYNTHROID) 150 MCG tablet Take 150 mcg by mouth daily.    Marland Kitchen lisinopril-hydrochlorothiazide (ZESTORETIC) 20-25 MG tablet Take 2 tablets by mouth  daily.     . metFORMIN (GLUCOPHAGE-XR) 500 MG 24 hr tablet Take 1,000 mg by mouth in the morning and at bedtime.    . nitroGLYCERIN (NITRODUR - DOSED IN MG/24 HR) 0.2 mg/hr patch Place 1 patch (0.2 mg total) onto the skin daily. (Patient taking differently: Place 0.2 mg onto the skin daily. Leg) 30 patch 12  . oxyCODONE-acetaminophen (PERCOCET) 5-325 MG tablet Take 1 tablet by mouth every 4 (four) hours as needed. 30 tablet 0  . pantoprazole (PROTONIX) 40 MG tablet Take 40 mg by mouth daily.    . pentoxifylline (TRENTAL) 400 MG CR tablet Take 1 tablet (400 mg total) by mouth 3 (three) times daily with meals. 90 tablet 3  . sulfamethoxazole-trimethoprim (BACTRIM DS) 800-160 MG tablet TAKE 1 TABLET BY MOUTH TWICE DAILY (Patient taking differently: Take 1 tablet by mouth 2 (two) times daily.) 60 tablet 0   No current facility-administered medications for this visit.    REVIEW OF SYSTEMS:  [X]  denotes positive finding, [ ]  denotes negative finding Cardiac  Comments:  Chest pain or chest pressure:    Shortness of breath upon exertion:    Short of breath when lying flat:    Irregular heart rhythm:        Vascular    Pain in calf, thigh, or hip brought on by ambulation:    Pain in feet at night that wakes you up from your sleep:     Blood clot in your veins:    Leg swelling:         Pulmonary    Oxygen at home:    Productive cough:     Wheezing:         Neurologic    Sudden weakness in arms or legs:     Sudden numbness in arms or legs:     Sudden onset of difficulty speaking or slurred speech:    Temporary loss of vision in one eye:     Problems with dizziness:         Gastrointestinal    Blood in stool:     Vomited blood:         Genitourinary    Burning when urinating:     Blood in urine:        Psychiatric    Major depression:         Hematologic    Bleeding problems:    Problems with blood clotting too easily:        Skin    Rashes or ulcers:        Constitutional     Fever or chills:      PHYSICAL EXAM: Vitals:   05/03/20 1431  BP: (!) 145/68  Pulse: 73  Resp: 16  Temp: 97.6 F (36.4 C)  TempSrc: Temporal  SpO2: 98%  Weight: 243 lb (110.2 kg)  Height: 5\' 4"  (1.626 m)    GENERAL: The patient is a well-nourished female, in  no acute distress. The vital signs are documented above. CARDIAC: There is a regular rate and rhythm.  VASCULAR:  DP PT signals brisk in right foot with doppler Dorsal wound is pictured below - healing nicely after skin graft         DATA:   Right lower extremity arterial duplex from October shows some moderate stenosis in the distal common femoral as well as in the SFA but no critical high-grade stenosis.  Assessment/Plan:  55 year old female status post right leg intervention with SFA atherectomy angioplasty and stent placement as well as above-knee popliteal angioplasty for CLI with tissue loss.  As pictured above her wound is made excellent progress after recent skin grafting by Dr. Lajoyce Corners.  She has very brisk Doppler signals at the ankle.  Discussed she does have some moderate stenosis in the right lower extremity where previous stenting has been performed (but no critical stenosis) and I will have her follow-up in 6 months with noninvasive imaging including arterial duplex and ABIs.  Cephus Shelling, MD [facility] Office: [phone]

## 2020-05-04 ENCOUNTER — Other Ambulatory Visit: Payer: Self-pay

## 2020-05-04 DIAGNOSIS — I739 Peripheral vascular disease, unspecified: Secondary | ICD-10-CM

## 2020-05-12 ENCOUNTER — Ambulatory Visit: Payer: BC Managed Care – PPO | Admitting: Orthopedic Surgery

## 2020-05-19 ENCOUNTER — Ambulatory Visit: Payer: BC Managed Care – PPO | Admitting: Physician Assistant

## 2020-06-02 ENCOUNTER — Encounter: Payer: Self-pay | Admitting: Physician Assistant

## 2020-06-02 ENCOUNTER — Ambulatory Visit (INDEPENDENT_AMBULATORY_CARE_PROVIDER_SITE_OTHER): Payer: BC Managed Care – PPO | Admitting: Physician Assistant

## 2020-06-02 DIAGNOSIS — Z945 Skin transplant status: Secondary | ICD-10-CM

## 2020-06-02 NOTE — Progress Notes (Signed)
Office Visit Note   Patient: Kayla Schmitt           Date of Birth: 05/28/1965           MRN: [id number] Visit Date: 06/02/2020              Requested by: Medicine, [facility] [address] [place],  [place] [address] PCP: Medicine, [facility]  Chief Complaint  Patient presents with  . Right Foot - Routine Post Op    03/18/20 right foot SG      HPI: Presents today for follow-up for skin grafting to the dorsum of her right foot.  She is now 2 months status post Kerecis skin substitute.  She is doing extremely well and pleased with the results.  Assessment & Plan: Visit Diagnoses: No diagnosis found.  Plan: Patient may follow-up as needed.  Encouraged use of Shea butter and cocoa butter to soften the area.  Follow-Up Instructions: No follow-ups on file.   Ortho Exam  Patient is alert, oriented, no adenopathy, well-dressed, normal affect, normal respiratory effort. Examination of her foot demonstrates no soft tissue swelling graft has completely epithelialized.  Just some areas of dryness and roughness.  No surrounding cellulitis no drainage no evidence of infection.  She does have difficulty with EHL function though this is not a problem to her.  Good flexion of the great toe  Imaging: No results found.    Labs: Lab Results  Component Value Date   HGBA1C 11.0 (H) 01/11/2020   ESRSEDRATE 136 (H) 01/14/2020   ESRSEDRATE 126 (H) 01/13/2020   ESRSEDRATE 125 (H) 01/12/2020   CRP 11.3 (H) 01/14/2020   CRP 13.0 (H) 01/13/2020   CRP 15.8 (H) 01/12/2020   REPTSTATUS 01/16/2020 FINAL 01/11/2020   CULT  01/11/2020    NO GROWTH 5 DAYS Performed at [facility], [address], [place], [place] [address]      Lab Results  Component Value Date   ALBUMIN 2.7 (L) 01/15/2020   ALBUMIN 2.7 (L) 01/14/2020   ALBUMIN 3.0 (L) 01/12/2020    Lab Results  Component Value Date   MG 1.4 (L) 01/13/2020   No results found for: VD25OH  No results found for: PREALBUMIN CBC  EXTENDED Latest Ref Rng & Units 03/18/2020 01/15/2020 01/14/2020  WBC 4.0 - 10.5 K/uL 4.6 7.7 6.1  RBC 3.87 - 5.11 MIL/uL 3.47(L) 3.09(L) 3.12(L)  HGB 12.0 - 15.0 g/dL 10.0(L) 8.6(L) 9.0(L)  HCT 36.0 - 46.0 % 31.3(L) 28.0(L) 28.5(L)  PLT 150 - 400 K/uL 203 269 248  NEUTROABS 1.7 - 7.7 K/uL - - 3.6  LYMPHSABS 0.7 - 4.0 K/uL - - 1.8     There is no height or weight on file to calculate BMI.  Orders:  No orders of the defined types were placed in this encounter.  No orders of the defined types were placed in this encounter.    Procedures: No procedures performed  Clinical Data: No additional findings.  ROS:  All other systems negative, except as noted in the HPI. Review of Systems  Objective: Vital Signs: There were no vitals taken for this visit.  Specialty Comments:  No specialty comments available.  PMFS History: Patient Active Problem List   Diagnosis Date Noted  . DM type 2 (diabetes mellitus, type 2) (HCC) 01/28/2020  . Hyperlipidemia 01/28/2020  . Hypertension 01/28/2020  . Hypothyroidism 01/28/2020  . Obesity 01/28/2020  . Ulcerated, foot, right, limited to breakdown of skin (HCC)   .  Cellulitis of right foot   . Peripheral arterial disease (HCC)   . Cellulitis 01/11/2020  . Blister of leg 01/06/2020  . 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/28/2017  . Chest pain 10/28/2017  . GERD (gastroesophageal reflux disease) 10/28/2017   Past Medical History:  Diagnosis Date  . Arthritis   . Diabetes mellitus without complication (HCC)    type 2  . GERD (gastroesophageal reflux disease)   . Hypercholesteremia   . Hypertension   . Hypothyroidism   . PAD (peripheral artery disease) (HCC)   . PONV (postoperative nausea and vomiting)   . Thyroid disease     Family History  Problem Relation Age of Onset  . Diabetes Other   . CAD Other     Past Surgical History:  Procedure Laterality Date  . ABDOMINAL AORTOGRAM W/LOWER EXTREMITY Bilateral 01/14/2020    Procedure: ABDOMINAL AORTOGRAM W/LOWER EXTREMITY;  Surgeon: Cephus Shelling, MD;  Location: [facility] INVASIVE CV LAB;  Service: Cardiovascular;  Laterality: Bilateral;  . AMPUTATION TOE    . PERIPHERAL VASCULAR ATHERECTOMY Right 01/14/2020   Procedure: PERIPHERAL VASCULAR ATHERECTOMY;  Surgeon: Cephus Shelling, MD;  Location: [facility] INVASIVE CV LAB;  Service: Cardiovascular;  Laterality: Right;  sfa  . PERIPHERAL VASCULAR INTERVENTION Right 01/14/2020   Procedure: PERIPHERAL VASCULAR INTERVENTION;  Surgeon: Cephus Shelling, MD;  Location: [facility] INVASIVE CV LAB;  Service: Cardiovascular;  Laterality: Right;  sfa stent   . SKIN SPLIT GRAFT Right 03/18/2020   Procedure: SKIN GRAFTING RIGHT FOOT ULCER;  Surgeon: Nadara Mustard, MD;  Location: [facility] OR;  Service: Orthopedics;  Laterality: Right;  . WISDOM TOOTH EXTRACTION     Social History   Occupational History  . Not on file  Tobacco Use  . Smoking status: Former Smoker    Types: Cigarettes    Quit date: 01/2017    Years since quitting: 3.3  . Smokeless tobacco: Never Used  Vaping Use  . Vaping Use: Never used  Substance and Sexual Activity  . Alcohol use: Not Currently  . Drug use: Yes    Types: Marijuana    Comment: last use 03/12/20 - uses twice a month per pt 03/16/20  . Sexual activity: Yes    Birth control/protection: Post-menopausal

## 2021-01-19 ENCOUNTER — Other Ambulatory Visit: Payer: Self-pay

## 2021-01-19 ENCOUNTER — Ambulatory Visit (HOSPITAL_COMMUNITY)
Admission: RE | Admit: 2021-01-19 | Discharge: 2021-01-19 | Disposition: A | Discharge status: Home / Self Care | Payer: BC Managed Care – PPO | Source: Ambulatory Visit | Attending: Vascular Surgery | Admitting: Vascular Surgery

## 2021-01-19 ENCOUNTER — Ambulatory Visit (INDEPENDENT_AMBULATORY_CARE_PROVIDER_SITE_OTHER)
Admission: RE | Admit: 2021-01-19 | Discharge: 2021-01-19 | Disposition: A | Discharge status: Home / Self Care | Payer: BC Managed Care – PPO | Source: Ambulatory Visit | Attending: Vascular Surgery | Admitting: Vascular Surgery

## 2021-01-19 DIAGNOSIS — I739 Peripheral vascular disease, unspecified: Secondary | ICD-10-CM

## 2021-02-07 ENCOUNTER — Encounter: Payer: Self-pay | Admitting: Vascular Surgery

## 2021-02-07 ENCOUNTER — Ambulatory Visit (INDEPENDENT_AMBULATORY_CARE_PROVIDER_SITE_OTHER): Payer: BC Managed Care – PPO | Admitting: Vascular Surgery

## 2021-02-07 ENCOUNTER — Other Ambulatory Visit: Payer: Self-pay

## 2021-02-07 VITALS — BP 170/81 | HR 78 | Temp 98.2°F | Resp 14 | Ht 64.0 in | Wt 254.0 lb

## 2021-02-07 DIAGNOSIS — I739 Peripheral vascular disease, unspecified: Secondary | ICD-10-CM | POA: Diagnosis not present

## 2021-02-07 MED ORDER — CLOPIDOGREL BISULFATE 75 MG PO TABS: 75.0000 mg | ORAL_TABLET | Freq: Every day | ORAL | 3 refills | Status: DC

## 2021-02-07 NOTE — Progress Notes (Signed)
Patient name: Kayla Schmitt MRN: [id number] DOB: 08-31-1965 Sex: female  REASON FOR VISIT: 19-month follow-up for surveillance of PAD  HPI: Kayla Schmitt is a 55 y.o. female history of hypertension, hyperlipidemia, diabetes that presents for surveillance of her right leg PAD.  She has no active tissue loss in the right foot at this time.  The wound is completely healed after previous skin grafting.  No new concerns today.  Denies any pain in her leg when she walks.  Denies any rest pain at night.  She underwent right leg intervention on 01/14/2020 with right SFA atherectomy and angioplasty and stent placement as well as right above-knee popliteal angioplasty.   She later underwent skin grafting with Dr. Lajoyce Corners.  Past Medical History:  Diagnosis Date   Arthritis    Diabetes mellitus without complication (HCC)    type 2   GERD (gastroesophageal reflux disease)    Hypercholesteremia    Hypertension    Hypothyroidism    PAD (peripheral artery disease) (HCC)    PONV (postoperative nausea and vomiting)    Thyroid disease     Past Surgical History:  Procedure Laterality Date   ABDOMINAL AORTOGRAM W/LOWER EXTREMITY Bilateral 01/14/2020   Procedure: ABDOMINAL AORTOGRAM W/LOWER EXTREMITY;  Surgeon: Cephus Shelling, MD;  Location: [facility] INVASIVE CV LAB;  Service: Cardiovascular;  Laterality: Bilateral;   AMPUTATION TOE     PERIPHERAL VASCULAR ATHERECTOMY Right 01/14/2020   Procedure: PERIPHERAL VASCULAR ATHERECTOMY;  Surgeon: Cephus Shelling, MD;  Location: [facility] INVASIVE CV LAB;  Service: Cardiovascular;  Laterality: Right;  sfa   PERIPHERAL VASCULAR INTERVENTION Right 01/14/2020   Procedure: PERIPHERAL VASCULAR INTERVENTION;  Surgeon: Cephus Shelling, MD;  Location: [facility] INVASIVE CV LAB;  Service: Cardiovascular;  Laterality: Right;  sfa stent    SKIN SPLIT GRAFT Right 03/18/2020   Procedure: SKIN GRAFTING RIGHT FOOT ULCER;  Surgeon: Nadara Mustard, MD;  Location: [facility] OR;   Service: Orthopedics;  Laterality: Right;   WISDOM TOOTH EXTRACTION      Family History  Problem Relation Age of Onset   Diabetes Other    CAD Other     SOCIAL HISTORY: Social History   Tobacco Use   Smoking status: Former    Types: Cigarettes    Quit date: 01/2017    Years since quitting: 4.0   Smokeless tobacco: Never  Substance Use Topics   Alcohol use: Not Currently    Allergies  Allergen Reactions   Penicillins Rash    Did it involve swelling of the face/tongue/throat, SOB, or low BP? no Did it involve sudden or severe rash/hives, skin peeling, or any reaction on the inside of your mouth or nose? yes Did you need to seek medical attention at a hospital or doctor's office? yes When did it last happen?      childhood allergy If all above answers are "NO", may proceed with cephalosporin use.    Doxycycline Nausea And Vomiting    Extremely N/V   Trental [Pentoxifylline] Nausea And Vomiting    Current Outpatient Medications  Medication Sig Dispense Refill   amLODipine (NORVASC) 5 MG tablet Take 5 mg by mouth daily.     aspirin 81 MG EC tablet Take 1 tablet by mouth daily.     atorvastatin (LIPITOR) 40 MG tablet Take 40 mg by mouth daily.     clopidogrel (PLAVIX) 75 MG tablet Take 1 tablet (75 mg total) by mouth daily with breakfast. 90 tablet 3   gabapentin (NEURONTIN) 100  MG capsule Take 300 mg by mouth at bedtime.     insulin degludec (TRESIBA FLEXTOUCH) 100 UNIT/ML FlexTouch Pen Inject 10 Units into the skin at bedtime. (Patient taking differently: Inject 14 Units into the skin at bedtime.) 3 mL 3   levothyroxine (SYNTHROID) 150 MCG tablet Take 150 mcg by mouth daily.     lisinopril-hydrochlorothiazide (ZESTORETIC) 20-25 MG tablet Take 2 tablets by mouth daily.      metFORMIN (GLUCOPHAGE-XR) 500 MG 24 hr tablet Take 1,000 mg by mouth in the morning and at bedtime.     pantoprazole (PROTONIX) 40 MG tablet Take 40 mg by mouth daily.     acetaminophen (TYLENOL) 325 MG  tablet Take 650 mg by mouth every 6 (six) hours as needed for mild pain. (Patient not taking: Reported on 02/07/2021)     collagenase (SANTYL) ointment Apply topically daily. (Patient not taking: Reported on 02/07/2021) 15 g 2   nitroGLYCERIN (NITRODUR - DOSED IN MG/24 HR) 0.2 mg/hr patch Place 1 patch (0.2 mg total) onto the skin daily. (Patient not taking: Reported on 02/07/2021) 30 patch 12   oxyCODONE-acetaminophen (PERCOCET) 5-325 MG tablet Take 1 tablet by mouth every 4 (four) hours as needed. (Patient not taking: Reported on 02/07/2021) 30 tablet 0   pentoxifylline (TRENTAL) 400 MG CR tablet Take 1 tablet (400 mg total) by mouth 3 (three) times daily with meals. (Patient not taking: Reported on 02/07/2021) 90 tablet 3   sulfamethoxazole-trimethoprim (BACTRIM DS) 800-160 MG tablet TAKE 1 TABLET BY MOUTH TWICE DAILY (Patient not taking: Reported on 02/07/2021) 60 tablet 0   No current facility-administered medications for this visit.    REVIEW OF SYSTEMS:  [X]  denotes positive finding, [ ]  denotes negative finding Cardiac  Comments:  Chest pain or chest pressure:    Shortness of breath upon exertion:    Short of breath when lying flat:    Irregular heart rhythm:        Vascular    Pain in calf, thigh, or hip brought on by ambulation:    Pain in feet at night that wakes you up from your sleep:     Blood clot in your veins:    Leg swelling:         Pulmonary    Oxygen at home:    Productive cough:     Wheezing:         Neurologic    Sudden weakness in arms or legs:     Sudden numbness in arms or legs:     Sudden onset of difficulty speaking or slurred speech:    Temporary loss of vision in one eye:     Problems with dizziness:         Gastrointestinal    Blood in stool:     Vomited blood:         Genitourinary    Burning when urinating:     Blood in urine:        Psychiatric    Major depression:         Hematologic    Bleeding problems:    Problems with blood  clotting too easily:        Skin    Rashes or ulcers:        Constitutional    Fever or chills:      PHYSICAL EXAM: Vitals:   02/07/21 1510 02/07/21 1517 02/07/21 1518  BP: (!) 214/73 (!) 214/77 (!) 170/81  Pulse: 78 78 78  Resp: 14  Temp: 98.2 F (36.8 C)    TempSrc: Temporal    SpO2: 97%    Weight: 254 lb (115.2 kg)    Height: 5\' 4"  (1.626 m)      GENERAL: The patient is a well-nourished female, in no acute distress. The vital signs are documented above. CARDIAC: There is a regular rate and rhythm.  VASCULAR:  Bilateral femoral pulses palpable No palpable right pedal pulses but foot warm and wound has healed as pictured below         DATA:   ABIs are noncompressible  Right lower extremity arterial duplex shows patent SFA stent with again demonstrated disease in the distal common femoral and proximal SFA  Assessment/Plan:  55 year old female status post right leg intervention with SFA atherectomy angioplasty and stent placement as well as above-knee popliteal angioplasty for CLI with tissue loss last year.  As pictured above her foot wound is now completely healed.  This was after skin grafting by Dr. 57.  Her duplex today shows her right SFA stent is widely patent.  She does have some other common femoral and proximal SFA disease that we have been previously following.  She is having no lower extremity claudication, no rest pain, and no tissue loss.  Given asymptomatic, I see no indication for intervention.  We will see her in 1 year with right leg arterial duplex and ABIs.  Discussed if she starts having any symptoms to let Lajoyce Corners know.  I did refill her Plavix today.  [place], MD [facility] Office: [phone]

## 2021-06-24 IMAGING — CT CT ANGIO AOBIFEM WO/W CM
2 of 11 series · 10 of 46 positions shown, 13 images · IV contrast (Omnipaque or Isovue)
Comparison: Noninvasive 01/12/2020

CLINICAL DATA: 54-year-old female with a history of
claudication/foot ulcer with diabetes

EXAM:
CT ANGIOGRAPHY OF ABDOMINAL AORTA WITH ILIOFEMORAL RUNOFF
TECHNIQUE: Multidetector CT imaging of the abdomen, pelvis and lower
extremities was performed using the standard protocol during bolus
administration of intravenous contrast. Multiplanar CT image
reconstructions and MIPs were obtained to evaluate the vascular
anatomy.
CONTRAST:  125mL OMNIPAQUE IOHEXOL 350 MG/ML SOLN

[Series 18: arterial · axial · arterial · 0.98mm/px · z∈[+335,+695]mm · 2 of 361 slices shown, 5 images]
[im 121/361  soft-tissue]
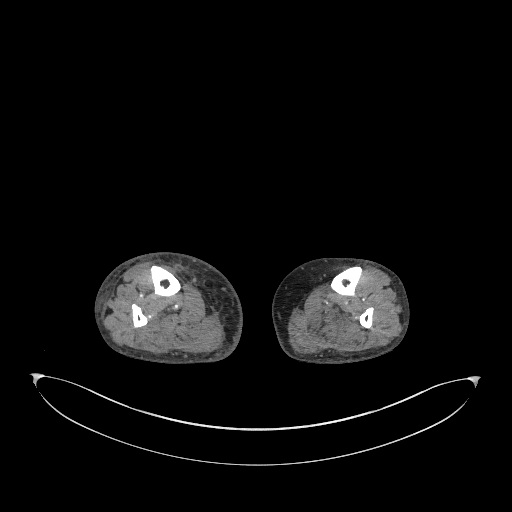
[im 121/361  lung]
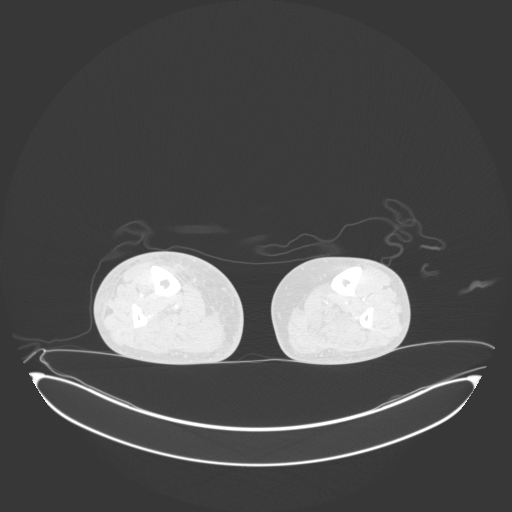
[im 121/361  bone]
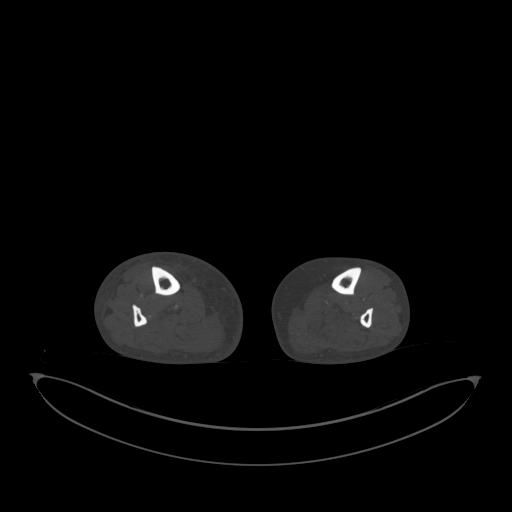
[im 241/361  soft-tissue]
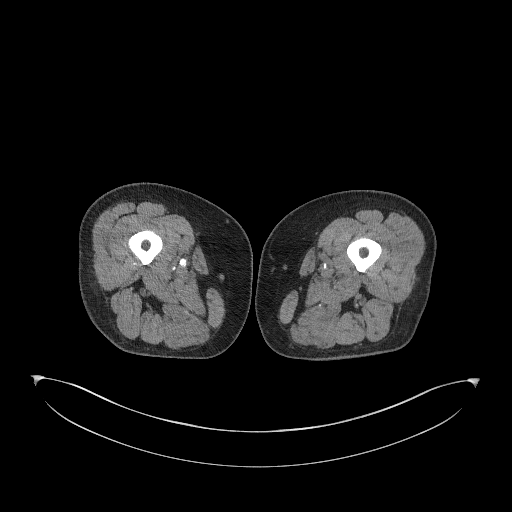
[im 241/361  lung]
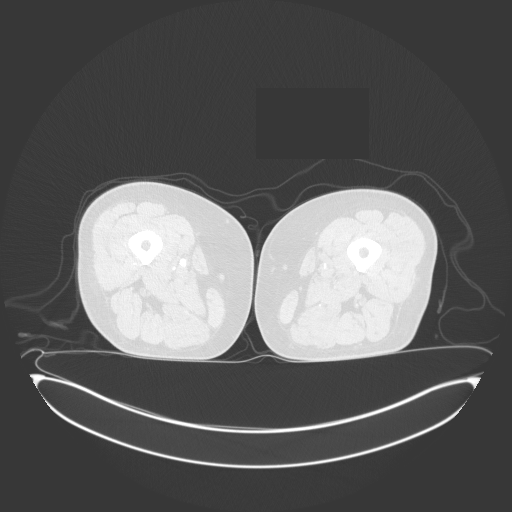

[Series 19: arterial thins · axial · arterial · 0.98mm/px · z∈[+49,+964]mm · 8 of 2181 slices shown]
[im 175/2181  soft-tissue]
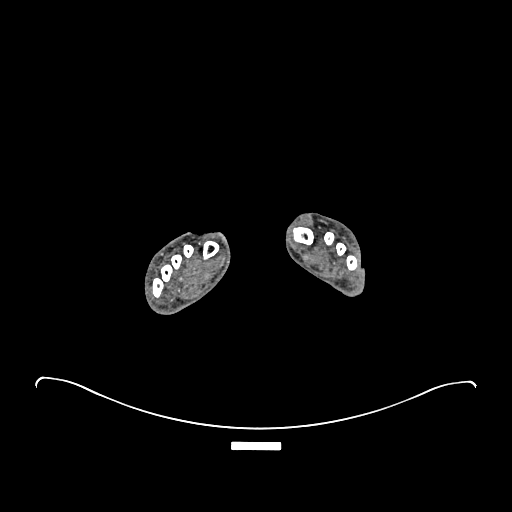
[im 437/2181  soft-tissue]
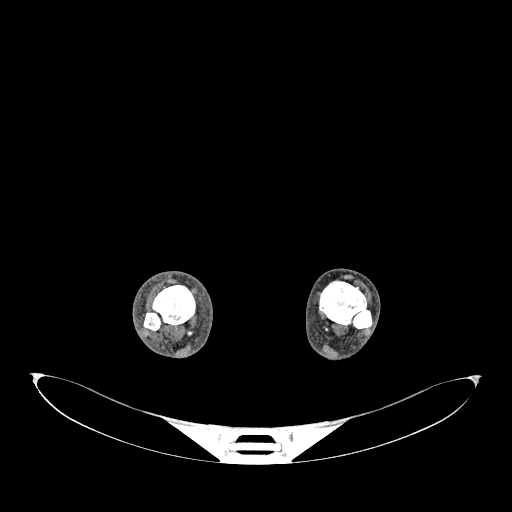
[im 698/2181  soft-tissue]
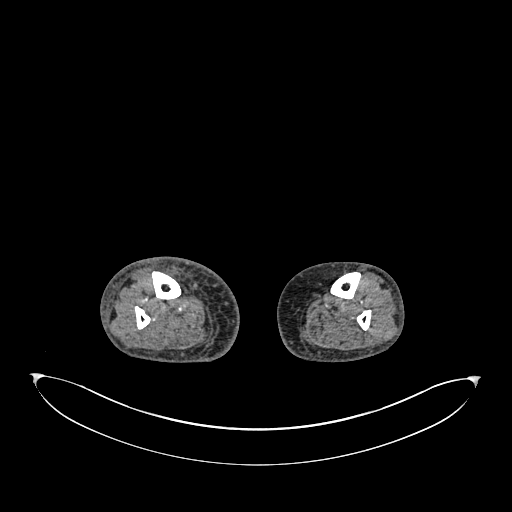
[im 960/2181  soft-tissue]
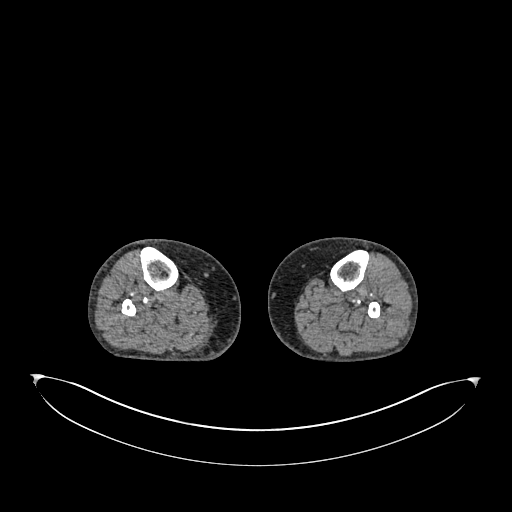
[im 1221/2181  soft-tissue]
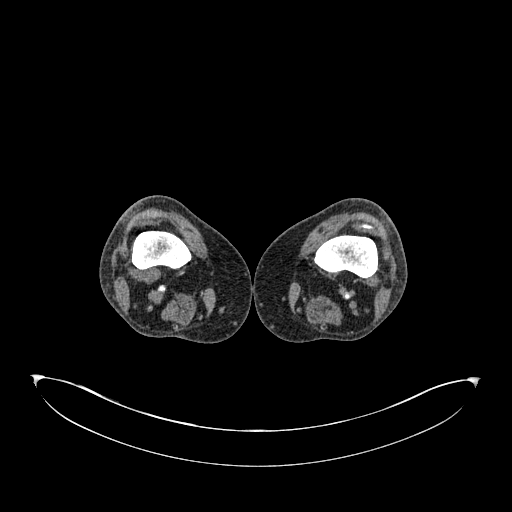
[im 1483/2181  soft-tissue]
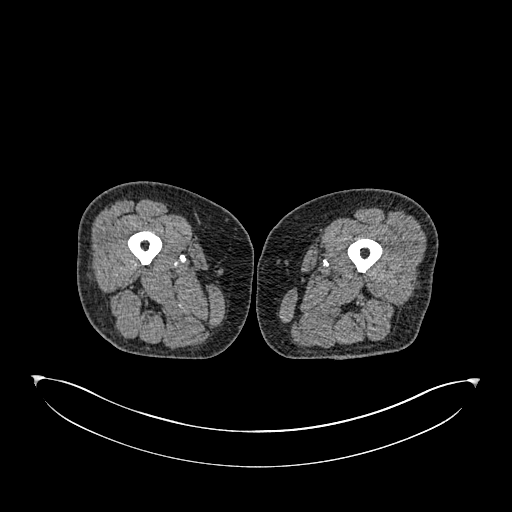
[im 1745/2181  soft-tissue]
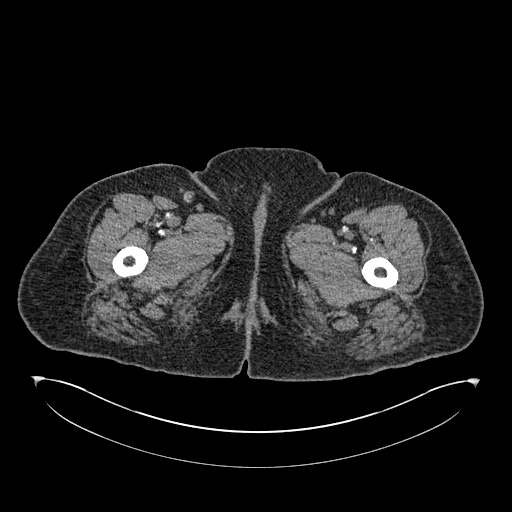
[im 2006/2181  soft-tissue]
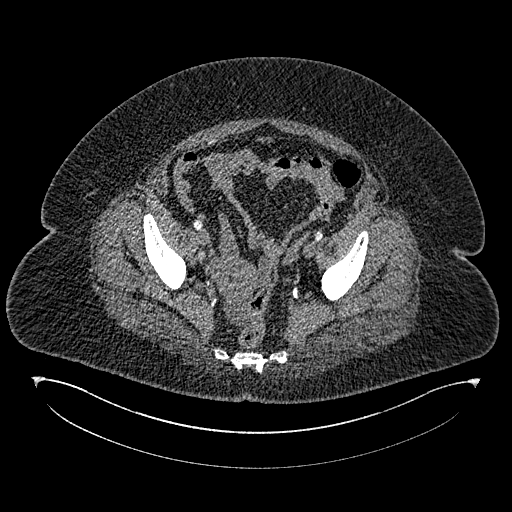

[10 of 46 positions shown; findings below may reference images not displayed]

FINDINGS: VASCULAR

Aorta: Moderate atherosclerotic changes with calcified and soft
plaque of the abdominal aorta. No dissection. No aneurysm. No
periaortic inflammatory changes.

Celiac: Atherosclerotic changes at the origin of the celiac artery.
There appears to be 50% narrowing at the origin given the calcified
plaque.

SMA: Mixed calcified and soft plaque at the origin of the SMA. SMA
remains patent, however, there is greater than 50% narrowing
estimated on the axial and parasagittal images.

Renals:

- Right: Single right renal artery. Calcifications at the origin of
the right renal artery without evidence of high-grade stenosis.

- Left: Single left renal artery. Atherosclerotic changes at the
origin of the left renal artery without evidence of high-grade
stenosis.

IMA: IMA is patent with atherosclerotic changes at the origin.

Right lower extremity:

Diffuse atherosclerotic changes through the right iliac system,
moderate to advanced, with predominantly calcified features. No
aneurysm or dissection. The greatest degree of atherosclerosis
involves the origin of the common iliac artery and the proximal
external iliac artery.

Hypogastric artery is patent with atherosclerotic changes at the
origin.

Moderate atherosclerotic changes of the common femoral artery,
predominantly on the posterior wall calcifications extend through
the common femoral artery into the proximal superficial femoral
artery where there is likely high-grade stenosis at the SFA origin.

Atherosclerotic changes at the origin of the profunda femoris, with
patency of the thigh branches.

Advanced atherosclerotic changes of the superficial femoral artery
with at least multifocal stenoses secondary to calcified plaque.
Calcified plaque within the abductor canal limits evaluation for
short segment occlusion.

Atherosclerotic changes extend through the popliteal artery, which
appears patent.

Anterior tibial artery is patent at the origin with minimal
calcifications proximally and appears patent to the ankle.

Tibioperoneal trunk with moderate calcifications, appears patent.

Peroneal artery is patent from the origin to the ankle.

Posterior tibial artery is patent from the origin to the ankle with
mild atherosclerotic calcifications.

Left lower extremity:

Diffuse atherosclerotic changes of the left iliac system, moderate
to advanced severity. No aneurysm or dissection. No high-grade
stenosis of the common iliac artery. No high-grade stenosis of the
external iliac artery.

Hypogastric artery remains patent.

Moderate atherosclerotic changes of the common femoral artery with
predominantly calcifications on the posterior wall.

Profunda femoris remains patent with atherosclerotic changes at the
origin. Thigh branches patent.

Advanced atherosclerotic changes of the left SFA, with multifocal
high-grade stenoses proximally and occlusion within the abductor
canal. Reconstitution of the popliteal artery with moderate
atherosclerotic changes.

Calcifications at the origin of the anterior tibial artery, limiting
evaluation for stenosis/occlusion. The distal anterior tibial artery
remains patent.

Tibioperoneal trunk with moderate atherosclerotic changes, remains
patent.

Calcifications at the origin of the peroneal artery, which appears
patent distally.

Posterior tibial artery with atherosclerotic changes at the origin,
patent in the mid segment and distally.

Veins: Unremarkable appearance of the venous system.

Review of the MIP images confirms the above findings.

NON-VASCULAR

Lower chest: No acute.

Hepatobiliary: Diffusely decreased attenuation of liver parenchyma.
Gallbladder relatively decompressed without stones in the lumen.

Pancreas: Unremarkable.

Spleen: The span of the spleen on the axial images is greater than
14 cm.

Adrenals/Urinary Tract:

- Right adrenal gland: 10 mm nodule in the body of the right adrenal
gland. Hounsfield units measure 44 on this single phase imaging.

- Left adrenal gland: Unremarkable.

- Right kidney: No hydronephrosis, nephrolithiasis, inflammation, or
ureteral dilation. No focal lesion.

- Left Kidney: No hydronephrosis, nephrolithiasis, inflammation, or
ureteral dilation. No focal lesion.

- Urinary Bladder: Unremarkable.

Stomach/Bowel:

- Stomach: Hiatal hernia with otherwise unremarkable stomach.

- Small bowel: Unremarkable

- Appendix: Normal.

- Colon: Unremarkable.

Lymphatic: No mesenteric or retroperitoneal adenopathy.

Lymph nodes present within the bilateral inguinal region, not
enlarged and potentially reactive.

Mesenteric: No free fluid or air. No mesenteric adenopathy.

Reproductive: Unremarkable appearance of the pelvic organs.

Other: No hernia.

Musculoskeletal: No acute displaced fracture. Degenerative changes
of the lower thoracic/lumbar spine including vacuum disc phenomenon
at T11-T12 and L5-S1. No significant bony canal narrowing.
Degenerative changes bilateral hips. Degenerative changes bilateral
knees.

Soft tissue defect within the dorsum of the right foot overlying the
interspace of the first and second metatarsal, compatible with the
given history. Mild edema within the superficial tissues of the
right leg compared to the left.
IMPRESSION: Multilevel PA D, including:

-moderate aortic atherosclerosis without aneurysm or high-grade
stenosis. Aortic Atherosclerosis ([id number]).

-moderate right iliac arterial disease, without high-grade stenosis
by CT.

-advanced right femoropopliteal disease, including partially
calcified plaque involving the common femoral artery extending into
the proximal SFA. There is a favored SFA occlusion secondary to
calcified plaque within the adductor canal.

-mild right tibial arterial disease.

-moderate left iliac arterial disease without high-grade stenosis or
occlusion.

-advanced left femoropopliteal disease, including occlusion of the
SFA within the adductor canal.

-mild left tibial arterial disease.

Mesenteric arterial disease with estimated 50% narrowing of the
celiac artery, favored high-grade stenosis at the origin of the SMA,
and atherosclerosis at the origin of the IMA.

Mild bilateral renal arterial disease.

Steatosis.

Splenomegaly.

10 mm lesion within the right adrenal gland. While this is most
likely adrenal adenoma, the current CT is nondiagnostic. If
indicated/warranted, diagnostic imaging to consider would be adrenal
protocol MRI, or adrenal protocol CT.

Additional ancillary findings as above.

## 2021-06-24 IMAGING — MR MR FOOT*R* W/O CM
5 series · 40 of 40 positions shown · non-contrast
Comparison: Right foot x-rays from yesterday.

CLINICAL DATA: Dorsal foot wound.  Fever.  Diabetes.

EXAM:
MRI OF THE RIGHT FOREFOOT WITHOUT CONTRAST
TECHNIQUE: Multiplanar, multisequence MR imaging of the right forefoot was
performed. No intravenous contrast was administered.

[Series 3: T1 · coronal · right · 3.0mm · 0.38mm/px · 12 of 50 slices shown (1 of 2)]
[im 1/50]
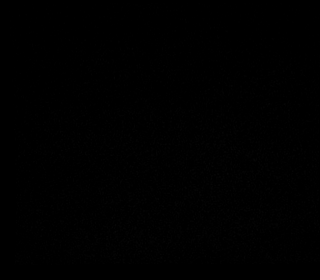
[im 5/50]
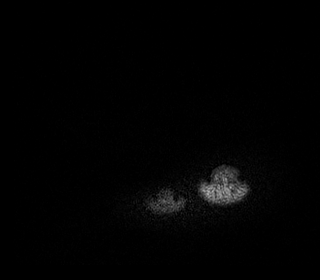
[im 9/50]
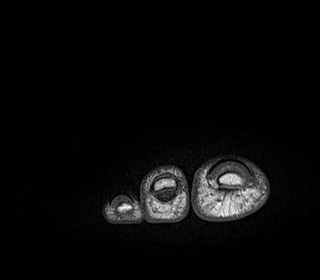
[im 14/50]
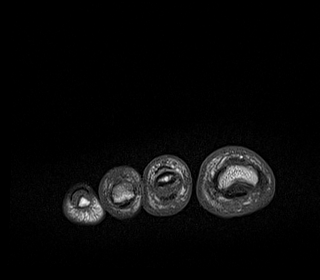
[im 18/50]
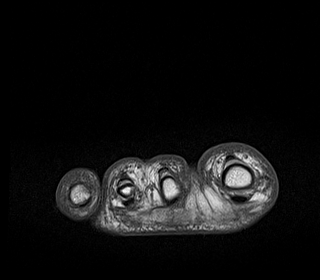
[im 23/50]
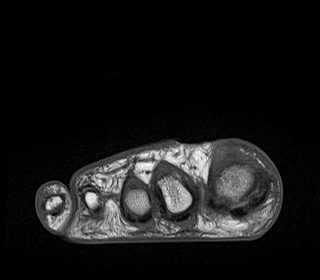
[im 27/50]
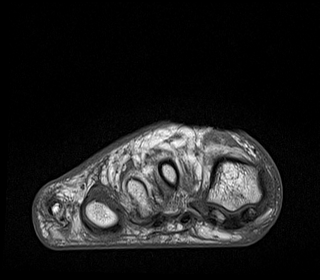
[im 32/50]
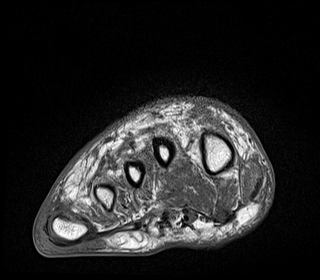
[im 36/50]
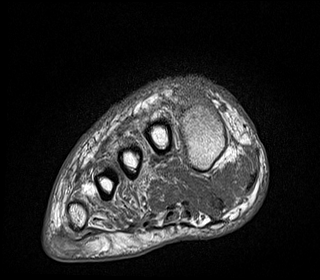
[im 41/50]
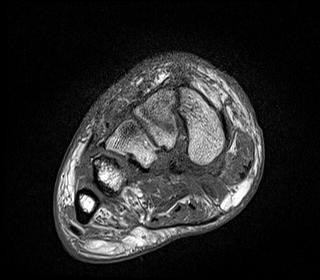
[im 45/50]
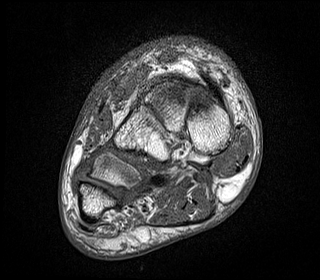
[im 50/50]
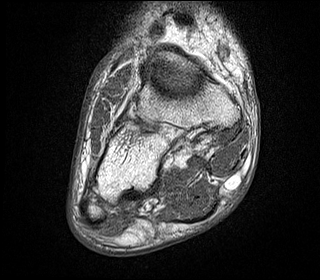

[Series 4: T2 fat-sat · coronal · right · 3.0mm · 0.38mm/px · 11 of 50 slices shown (1 of 2)]
[im 1/50]
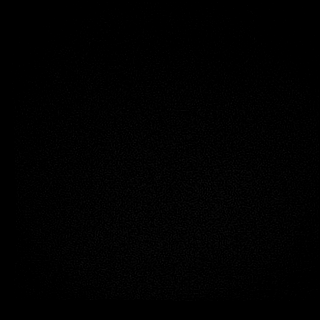
[im 5/50]
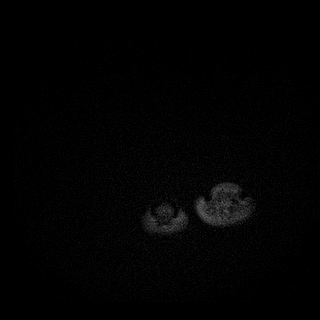
[im 10/50]
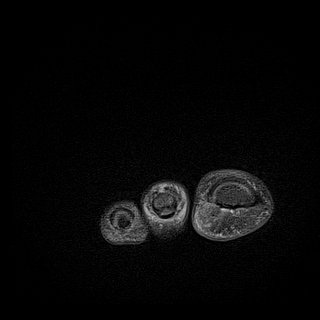
[im 15/50]
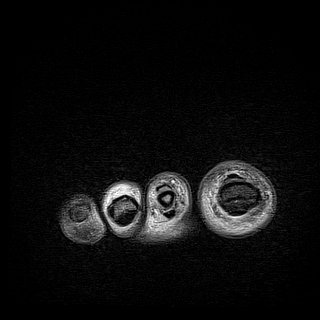
[im 20/50]
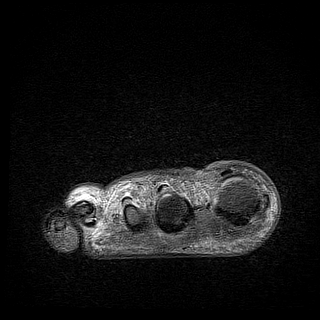
[im 25/50]
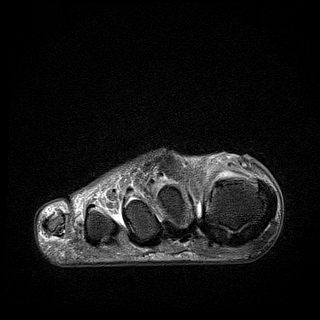
[im 30/50]
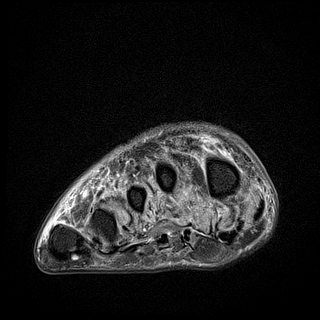
[im 35/50]
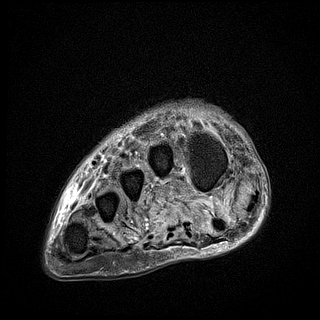
[im 40/50]
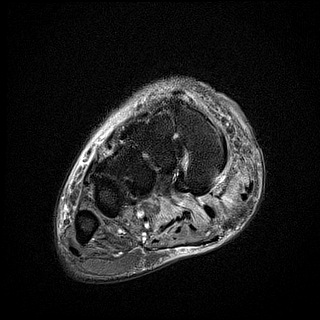
[im 45/50]
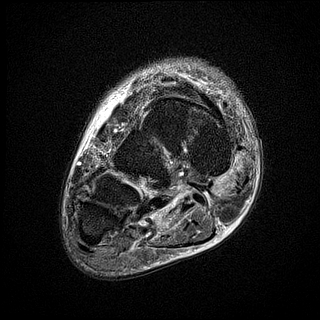
[im 50/50]
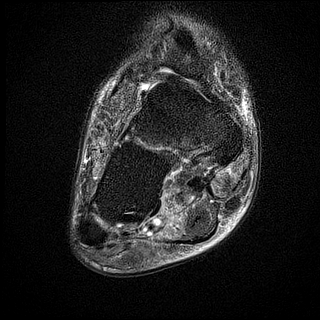

[Series 5: T1 · axial · right · 3.0mm · 0.62mm/px · z∈[-105,-23]mm · 5 of 22 slices shown (2 of 2)]
[im 1/22]
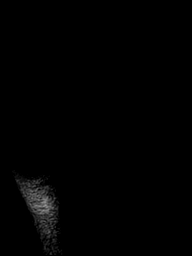
[im 6/22]
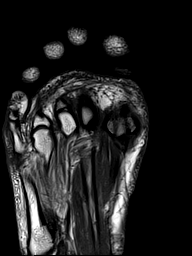
[im 11/22]
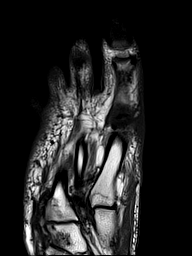
[im 16/22]
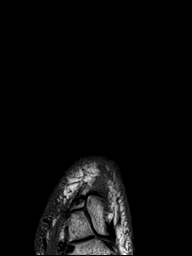
[im 22/22]
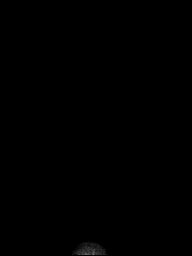

[Series 6: T2 fat-sat · axial · right · 3.0mm · 0.62mm/px · z∈[-113,-23]mm · 5 of 24 slices shown (2 of 2)]
[im 1/24]
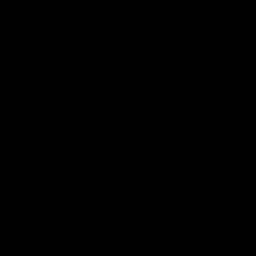
[im 6/24]
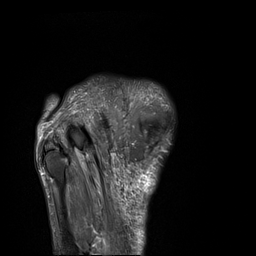
[im 12/24]
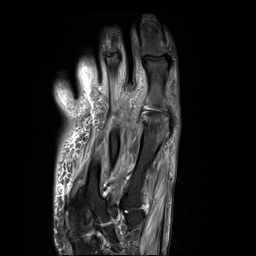
[im 18/24]
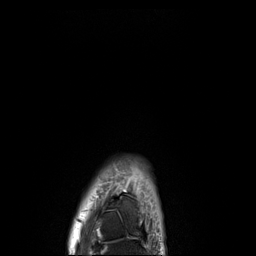
[im 24/24]
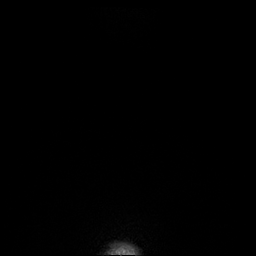

[Series 7: STIR · sagittal · right · 3.0mm · 0.55mm/px · 7 of 30 slices shown]
[im 1/30]
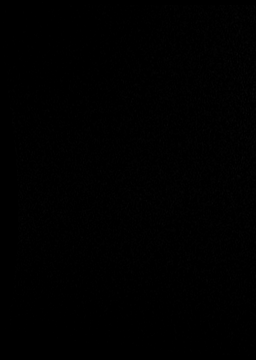
[im 5/30]
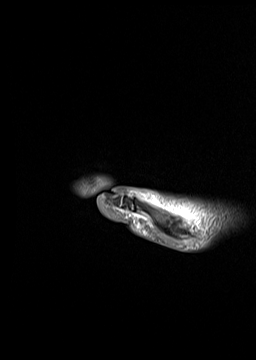
[im 10/30]
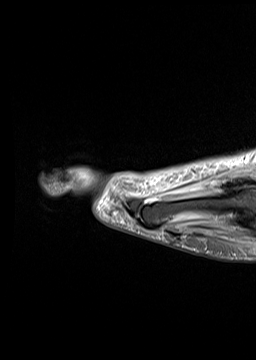
[im 15/30]
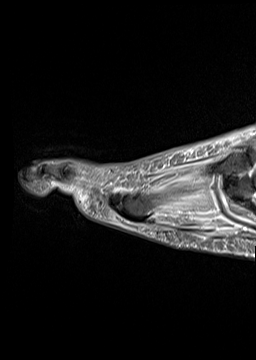
[im 20/30]
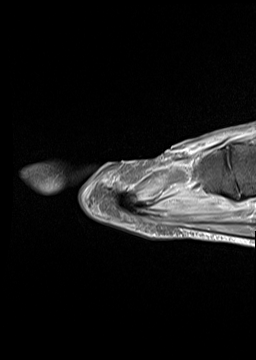
[im 25/30]
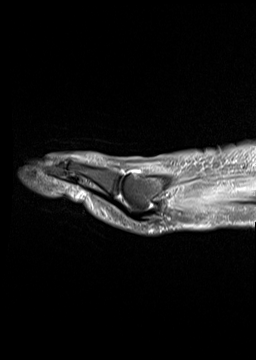
[im 30/30]
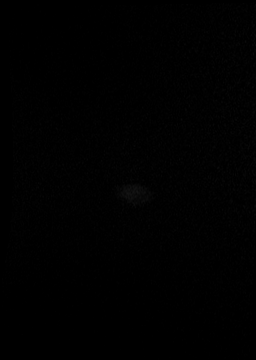

[40 of 40 positions shown; findings below may reference images not displayed]

FINDINGS: Bones/Joint/Cartilage

No marrow signal abnormality. No fracture or dislocation. Normal
alignment. Small first MTP joint effusion. Small amount of fluid in
the first and third intermetatarsal bursae.

Ligaments

Collateral ligaments are intact.  Lisfranc ligament is intact.

Muscles and Tendons
Short segment complete tear of the extensor hallucis longus tendon
with 1.2 cm retraction (series 7, image 23). Increased T2 signal
within the intrinsic muscles of the forefoot, nonspecific, but
likely related to diabetic muscle changes.

Soft tissue
Dorsal skin ulceration along the medial forefoot overlying the
distal first and second metatarsals. Mild soft tissue swelling of
the dorsal forefoot and toes. No fluid collection or hematoma. No
soft tissue mass.
IMPRESSION: 1. Dorsal skin ulceration along the medial forefoot overlying the
distal first and second metatarsals. No abscess or osteomyelitis.
2. Short segment complete tear of the extensor hallucis longus
tendon deep to the ulcer with 1.2 cm retraction.

## 2022-02-04 ENCOUNTER — Other Ambulatory Visit: Payer: Self-pay

## 2022-02-04 ENCOUNTER — Emergency Department (HOSPITAL_COMMUNITY): Payer: Managed Care, Other (non HMO)

## 2022-02-04 ENCOUNTER — Emergency Department (HOSPITAL_COMMUNITY)
Admission: EM | Admit: 2022-02-04 | Discharge: 2022-02-04 | Disposition: A | Discharge status: Home / Self Care | Payer: Managed Care, Other (non HMO) | Attending: Emergency Medicine | Admitting: Emergency Medicine

## 2022-02-04 ENCOUNTER — Other Ambulatory Visit: Payer: Self-pay | Admitting: Vascular Surgery

## 2022-02-04 ENCOUNTER — Encounter (HOSPITAL_COMMUNITY): Payer: Self-pay

## 2022-02-04 DIAGNOSIS — E119 Type 2 diabetes mellitus without complications: Secondary | ICD-10-CM | POA: Insufficient documentation

## 2022-02-04 DIAGNOSIS — Z7982 Long term (current) use of aspirin: Secondary | ICD-10-CM | POA: Insufficient documentation

## 2022-02-04 DIAGNOSIS — Z7984 Long term (current) use of oral hypoglycemic drugs: Secondary | ICD-10-CM | POA: Insufficient documentation

## 2022-02-04 DIAGNOSIS — M79672 Pain in left foot: Secondary | ICD-10-CM | POA: Insufficient documentation

## 2022-02-04 DIAGNOSIS — Z794 Long term (current) use of insulin: Secondary | ICD-10-CM | POA: Diagnosis not present

## 2022-02-04 LAB — CBC WITH DIFFERENTIAL/PLATELET
Abs Immature Granulocytes: 0.02 10*3/uL (ref 0.00–0.07)
Basophils Absolute: 0 10*3/uL (ref 0.0–0.1)
Basophils Relative: 1 %
Eosinophils Absolute: 0.2 10*3/uL (ref 0.0–0.5)
Eosinophils Relative: 2 %
HCT: 30.8 % — ABNORMAL LOW (ref 36.0–46.0)
Hemoglobin: 10.1 g/dL — ABNORMAL LOW (ref 12.0–15.0)
Immature Granulocytes: 0 %
Lymphocytes Relative: 30 %
Lymphs Abs: 2.3 10*3/uL (ref 0.7–4.0)
MCH: 29.8 pg (ref 26.0–34.0)
MCHC: 32.8 g/dL (ref 30.0–36.0)
MCV: 90.9 fL (ref 80.0–100.0)
Monocytes Absolute: 0.6 10*3/uL (ref 0.1–1.0)
Monocytes Relative: 8 %
Neutro Abs: 4.6 10*3/uL (ref 1.7–7.7)
Neutrophils Relative %: 59 %
Platelets: 218 10*3/uL (ref 150–400)
RBC: 3.39 MIL/uL — ABNORMAL LOW (ref 3.87–5.11)
RDW: 12.8 % (ref 11.5–15.5)
WBC: 7.8 10*3/uL (ref 4.0–10.5)
nRBC: 0 % (ref 0.0–0.2)

## 2022-02-04 LAB — COMPREHENSIVE METABOLIC PANEL
ALT: 15 U/L (ref 0–44)
AST: 16 U/L (ref 15–41)
Albumin: 3.6 g/dL (ref 3.5–5.0)
Alkaline Phosphatase: 81 U/L (ref 38–126)
Anion gap: 9 (ref 5–15)
BUN: 29 mg/dL — ABNORMAL HIGH (ref 6–20)
CO2: 24 mmol/L (ref 22–32)
Calcium: 9.3 mg/dL (ref 8.9–10.3)
Chloride: 106 mmol/L (ref 98–111)
Creatinine, Ser: 1.22 mg/dL — ABNORMAL HIGH (ref 0.44–1.00)
GFR, Estimated: 52 mL/min — ABNORMAL LOW (ref >60)
Glucose, Bld: 116 mg/dL — ABNORMAL HIGH (ref 70–99)
Potassium: 4 mmol/L (ref 3.5–5.1)
Sodium: 139 mmol/L (ref 135–145)
Total Bilirubin: 0.8 mg/dL (ref 0.3–1.2)
Total Protein: 7.2 g/dL (ref 6.5–8.1)

## 2022-02-04 LAB — D-DIMER, QUANTITATIVE: D-Dimer, Quant: 1.21 ug/mL-FEU — ABNORMAL HIGH (ref 0.00–0.50)

## 2022-02-04 LAB — URIC ACID: Uric Acid, Serum: 7.7 mg/dL — ABNORMAL HIGH (ref 2.5–7.1)

## 2022-02-04 MED ORDER — SULFAMETHOXAZOLE-TRIMETHOPRIM 800-160 MG PO TABS: 1.0000 | ORAL_TABLET | Freq: Two times a day (BID) | ORAL | 0 refills | Status: AC

## 2022-02-04 MED ORDER — HYDROCODONE-ACETAMINOPHEN 5-325 MG PO TABS
1.0000 | ORAL_TABLET | Freq: Once | ORAL | Status: DC
  Filled 2022-02-04: qty 1

## 2022-02-04 MED ORDER — SULFAMETHOXAZOLE-TRIMETHOPRIM 800-160 MG PO TABS
1.0000 | ORAL_TABLET | Freq: Once | ORAL | Status: AC
  Administered 2022-02-04: 1 via ORAL
  Filled 2022-02-04: qty 1

## 2022-02-04 MED ORDER — IBUPROFEN 800 MG PO TABS
800.0000 mg | ORAL_TABLET | Freq: Once | ORAL | Status: AC
  Administered 2022-02-04: 800 mg via ORAL
  Filled 2022-02-04: qty 1

## 2022-02-04 MED ORDER — IBUPROFEN 600 MG PO TABS: 600.0000 mg | ORAL_TABLET | Freq: Four times a day (QID) | ORAL | 0 refills | Status: DC | PRN

## 2022-02-04 NOTE — Discharge Instructions (Addendum)
Follow-up with podiatry as directed, you have an appointment for 3 PM on Tuesday.  You will receive a call from their office as well.  Please take your Bactrim as directed, please attempt to offload some of the pressure from your foot and you can also use ice for pain control.

## 2022-02-04 NOTE — ED Triage Notes (Signed)
Pt reports her left foot being tender. Pt states she is diabetic and normally doesn't feel anything. She wants her foot checked out.

## 2022-02-04 NOTE — ED Provider Notes (Signed)
[facility] EMERGENCY DEPARTMENT Provider Note   CSN: [id number] Arrival date & time: 02/04/22  1507     History  No chief complaint on file.   Kayla Schmitt is a 56 y.o. female, who presents to the ED secondary to left foot pain for the last day.  She notes that for the last day or so her foot has been tender, and more painful when she walks.  States is on the bottom of her foot, and then goes to her ankle.  Has noticed that is a bit warm, and tender to the touch.  All of her digits have been swollen as well.  Took some Tylenol this morning, and said it did not provide her any relief.  States this is very abnormal for her, because she typically does not have any sensation in her feet, and when she has pain she knows something is wrong.  Denies any trauma, however she states that she does not know if she hit it on something because she cannot feel anything.    Home Medications Prior to Admission medications   Medication Sig Start Date End Date Taking? Authorizing Provider  ibuprofen (ADVIL) 600 MG tablet Take 1 tablet (600 mg total) by mouth every 6 (six) hours as needed. 02/04/22  Yes Constanza Mincy L, PA  sulfamethoxazole-trimethoprim (BACTRIM DS) 800-160 MG tablet Take 1 tablet by mouth 2 (two) times daily for 7 days. 02/04/22 02/11/22 Yes Kedric Bumgarner L, PA  acetaminophen (TYLENOL) 325 MG tablet Take 650 mg by mouth every 6 (six) hours as needed for mild pain. Patient not taking: Reported on 02/07/2021    [provider]  amLODipine (NORVASC) 5 MG tablet Take 5 mg by mouth daily.    [provider]  aspirin 81 MG EC tablet Take 1 tablet by mouth daily.    [provider]  atorvastatin (LIPITOR) 40 MG tablet Take 40 mg by mouth daily. 12/14/19   [provider]  clopidogrel (PLAVIX) 75 MG tablet TAKE 1 TABLET BY MOUTH EVERY DAY WITH BREAKFAST 02/05/22   Maeola Harman, MD  collagenase (SANTYL) ointment Apply topically daily. Patient  not taking: Reported on 02/07/2021 01/16/20   Alberteen Sam, MD  gabapentin (NEURONTIN) 100 MG capsule Take 300 mg by mouth at bedtime. 12/21/19   [provider]  insulin degludec (TRESIBA FLEXTOUCH) 100 UNIT/ML FlexTouch Pen Inject 10 Units into the skin at bedtime. Patient taking differently: Inject 14 Units into the skin at bedtime. 01/15/20   Danford, Earl Lites, MD  levothyroxine (SYNTHROID) 150 MCG tablet Take 150 mcg by mouth daily. 12/02/19   [provider]  lisinopril-hydrochlorothiazide (ZESTORETIC) 20-25 MG tablet Take 2 tablets by mouth daily.  10/19/19   [provider]  metFORMIN (GLUCOPHAGE-XR) 500 MG 24 hr tablet Take 1,000 mg by mouth in the morning and at bedtime. 11/16/19   [provider]  nitroGLYCERIN (NITRODUR - DOSED IN MG/24 HR) 0.2 mg/hr patch Place 1 patch (0.2 mg total) onto the skin daily. Patient not taking: Reported on 02/07/2021 01/28/20   Persons, [place], PA  pantoprazole (PROTONIX) 40 MG tablet Take 40 mg by mouth daily. 12/21/19   [provider]  pentoxifylline (TRENTAL) 400 MG CR tablet Take 1 tablet (400 mg total) by mouth 3 (three) times daily with meals. Patient not taking: Reported on 02/07/2021 01/28/20   Persons, [place], [place]      Allergies    Penicillins, Doxycycline, and Trental [pentoxifylline]    Review  of Systems   Review of Systems  Musculoskeletal:        +L foot pain  Skin:  Negative for rash and wound.       +warmth of foot    Physical Exam Updated Vital Signs BP (!) 421/89   Pulse 74   Temp 98.3 F (36.8 C) (Oral)   Resp 18   Ht 5\' 4"  (1.626 m)   Wt 98.9 kg   SpO2 98%   BMI 37.42 kg/m  Physical Exam Vitals and nursing note reviewed.  Constitutional:      General: She is not in acute distress.    Appearance: She is well-developed.  HENT:     Head: Normocephalic and atraumatic.  Eyes:     Conjunctiva/sclera: Conjunctivae normal.  Cardiovascular:     Pulses: Normal  pulses.  Pulmonary:     Effort: No respiratory distress.  Musculoskeletal:     Comments: Left ankle/foot: TTP of lateral malleolus. Mild 1+ edema of left foot--particularly the digits. Able to bear weight. TTP of plantar aspect of ball of foot.  Able to plantar flex and dorsiflex ankle. Inversion/eversion intact. No midfoot tor base of 5th metatarsal tenderness to palpation. Capillary refill <2sec. Dorsalis pedis pulse present. No foot drop noted. Sensation intact. Warm to touch. No wounds visualized. Slightly tender along 4th digit. 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   Skin:    General: Skin is warm.     Capillary Refill: Capillary refill takes less than 2 seconds.     Comments: Increased warmth of medial ankle and plantar surface of foot  Neurological:     Mental Status: She is alert.     Comments: Clear speech.   Psychiatric:        Behavior: Behavior normal.        Thought Content: Thought content normal.     ED Results / Procedures / Treatments   Labs (all labs ordered are listed, but only abnormal results are displayed) Labs Reviewed  COMPREHENSIVE METABOLIC PANEL - Abnormal; Notable for the following components:      Result Value   Glucose, Bld 116 (*)    BUN 29 (*)    Creatinine, Ser 1.22 (*)    GFR, Estimated 52 (*)    All other components within normal limits  CBC WITH DIFFERENTIAL/PLATELET - Abnormal; Notable for the following components:   RBC 3.39 (*)    Hemoglobin 10.1 (*)    HCT 30.8 (*)    All other components within normal limits  D-DIMER, QUANTITATIVE - Abnormal; Notable for the following components:   D-Dimer, Quant 1.21 (*)    All other components within normal limits  URIC ACID - Abnormal; Notable for the following components:   Uric Acid, Serum 7.7 (*)    All other components within normal limits    EKG None  Radiology DG Foot Complete Left  Result Date: 02/04/2022 CLINICAL DATA:  Left foot tenderness. Diabetes with underlying neuropathy.  EXAM: LEFT TIBIA AND FIBULA - 2 VIEW; LEFT FOOT - COMPLETE 3+ VIEW COMPARISON:  No prior imaging available for direct comparison. The reports from an MRI of the left foot 06/27/2018 and CT of the left foot 05/01/2018 are correlated. FINDINGS: In the lower leg, there is no evidence of acute fracture, dislocation or bone destruction. Minimal degenerative changes at the left knee without significant joint effusion. There are prominent vascular calcifications. 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 There is diffuse abnormality of the 4th metatarsal with cortical thickening and erosion of the metatarsal head. There is medial subluxation/dislocation at the 4th metatarsophalangeal joint. There is flattening of the 2nd metatarsal head. Patient has been reported to have changes of chronic osteomyelitis involving the 4th metatarsal on prior imaging. Mild midfoot degenerative changes and plantar calcaneal spurs are noted. IMPRESSION: 1. No prior relevant imaging available for direct comparison. 2. 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 3. Diffuse abnormality of the 4th metatarsal with medial subluxation/dislocation at the 4th metatarsophalangeal joint. Findings may relate to previously reported chronic osteomyelitis. If concern of acute infection, consider follow-up MRI. 4. Probable [place] infraction of the 2nd metatarsal head. Electronically Signed   By: Carey Bullocks M.D.   On: 02/04/2022 17:28   DG Tibia/Fibula Left  Result Date: 02/04/2022 CLINICAL DATA:  Left foot tenderness. Diabetes with underlying neuropathy. EXAM: LEFT TIBIA AND FIBULA - 2 VIEW; LEFT FOOT - COMPLETE 3+ VIEW COMPARISON:  No prior imaging available for direct comparison. The reports from an MRI of the left foot 06/27/2018 and CT of the left foot 05/01/2018 are correlated. FINDINGS: In the lower leg, there is no evidence of acute  fracture, dislocation or bone destruction. Minimal degenerative changes at the left knee without significant joint effusion. There are prominent vascular calcifications. 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 There is diffuse abnormality of the 4th metatarsal with cortical thickening and erosion of the metatarsal head. There is medial subluxation/dislocation at the 4th metatarsophalangeal joint. There is flattening of the 2nd metatarsal head. Patient has been reported to have changes of chronic osteomyelitis involving the 4th metatarsal on prior imaging. Mild midfoot degenerative changes and plantar calcaneal spurs are noted. IMPRESSION: 1. No prior relevant imaging available for direct comparison. 2. 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 3. Diffuse abnormality of the 4th metatarsal with medial subluxation/dislocation at the 4th metatarsophalangeal joint. Findings may relate to previously reported chronic osteomyelitis. If concern of acute infection, consider follow-up MRI. 4. Probable [place] infraction of the 2nd metatarsal head. Electronically Signed   By: Carey Bullocks M.D.   On: 02/04/2022 17:28    Procedures Procedures   Medications Ordered in ED Medications  sulfamethoxazole-trimethoprim (BACTRIM DS) 800-160 MG per tablet 1 tablet (1 tablet Oral Given 02/04/22 1948)  ibuprofen (ADVIL) tablet 800 mg (800 mg Oral Given 02/04/22 1948)    ED Course/ Medical Decision Making/ A&P                           Medical Decision Making Amount and/or Complexity of Data Reviewed Labs: ordered. Radiology: ordered.  Risk Prescription drug management.   This patient presents to the ED for concern of left foot pain Co morbidities that complicate the patient evaluation  Diabetes, PAD   Lab Tests:  I Ordered, and personally interpreted labs.  The pertinent results include:  no  evidence of leukocytosis, mildly elevated uric acid, elevated d-dimer   Imaging Studies ordered:  I ordered imaging studies including xray, which  illustrate diffuse abnormality of the the 4th digit and findings concerning for possible relation to osteomyelitis   Consultations Obtained:  I requested consultation with the Dr. Hyacinth Meeker, my attending,  and discussed lab and imaging findings as well as pertinent plan - they recommend: consultation with podiatry, no need for f/u  vascular U/S as he believes clinically does not appear like DVT. Spoke with Podiatry on Call, Dr.Mckinney, he recommends starting on antibiotics, preferably doxycyline, and f/u in office at [facility] on Tuesday 10/10, no further imaging at this time as MRI is currently unavailable   Problem List / ED Course / Critical interventions / Medication management  I ordered medication including ibuprofen and bactrim  for pain and infection  Reevaluation of the patient after these medicines showed that the patient improved   Test / Admission - Considered:  Patient is a 56 year old female, history of diabetes, PAD, here for left foot pain.  Her labs showed an elevated D-dimer, however attending was consulted, and did not feel like she was presenting as a DVT, she did not have any chest pain or shortness of breath, this was discussed with patient, and she was okay with no follow-up vascular ultrasound.  Additionally uric acid was mildly elevated however she has pain and swelling in multiple areas not just one joint, this gout is not less likely.  She did not have any leukocytosis, is afebrile, and x-ray showed dysfunction of the fourth digit of the left foot, and concerns for chronic osteo versus acute.  Podiatry was consulted, and they recommended outpatient antibiotics, and close follow-up in the office.  She was scheduled for an appointment.  She was not able to start on doxycycline given her allergy, however she started on Bactrim.  Return  symptoms were emphasized.  Final Clinical Impression(s) / ED Diagnoses Final diagnoses:  Left foot pain    Rx / DC Orders ED Discharge Orders          Ordered    sulfamethoxazole-trimethoprim (BACTRIM DS) 800-160 MG tablet  2 times daily        02/04/22 1836    ibuprofen (ADVIL) 600 MG tablet  Every 6 hours PRN        02/04/22 1836              Roopa Graver, [place]ley [place], PA 02/05/22 1101    Eber Hong, MD 02/09/22 [phone]

## 2022-02-07 ENCOUNTER — Telehealth: Payer: Self-pay

## 2022-02-07 NOTE — Telephone Encounter (Signed)
Returned pt's call. LVM to call back

## 2022-06-05 ENCOUNTER — Other Ambulatory Visit: Payer: Self-pay | Admitting: *Deleted

## 2022-06-05 DIAGNOSIS — I739 Peripheral vascular disease, unspecified: Secondary | ICD-10-CM

## 2022-06-19 ENCOUNTER — Encounter: Payer: Self-pay | Admitting: Vascular Surgery

## 2022-06-19 ENCOUNTER — Ambulatory Visit: Payer: Managed Care, Other (non HMO) | Admitting: Vascular Surgery

## 2022-06-19 ENCOUNTER — Ambulatory Visit (HOSPITAL_COMMUNITY)
Admission: RE | Admit: 2022-06-19 | Discharge: 2022-06-19 | Disposition: A | Discharge status: Home / Self Care | Payer: Managed Care, Other (non HMO) | Source: Ambulatory Visit | Attending: Vascular Surgery

## 2022-06-19 ENCOUNTER — Ambulatory Visit (HOSPITAL_COMMUNITY)
Admission: RE | Admit: 2022-06-19 | Discharge: 2022-06-19 | Disposition: A | Discharge status: Home / Self Care | Payer: Managed Care, Other (non HMO) | Source: Ambulatory Visit | Attending: Vascular Surgery | Admitting: Vascular Surgery

## 2022-06-19 VITALS — BP 121/74 | HR 86 | Temp 97.7°F | Resp 14 | Ht 64.0 in | Wt 205.0 lb

## 2022-06-19 DIAGNOSIS — I739 Peripheral vascular disease, unspecified: Secondary | ICD-10-CM

## 2022-06-19 DIAGNOSIS — I70223 Atherosclerosis of native arteries of extremities with rest pain, bilateral legs: Secondary | ICD-10-CM | POA: Insufficient documentation

## 2022-06-19 LAB — VAS US ABI WITH/WO TBI
Left ABI: 0.51
Right ABI: 0.98

## 2022-06-19 NOTE — Progress Notes (Signed)
Patient name: Kayla Schmitt MRN: [id number] DOB: [birth year] Sex: female  REASON FOR VISIT: 1 year follow-up for surveillance of PAD  HPI: Kayla Schmitt is a 57 y.o. female history of hypertension, hyperlipidemia, diabetes that presents for surveillance of her right leg PAD.  She reports she has now developed an ulcer on her left big toe that has been present for several months and not healing.  She has also developed an ulcer on her right second toe.  She previously underwent right leg intervention on 01/14/2020 with right SFA atherectomy and angioplasty and stent placement as well as right above-knee popliteal angioplasty.   She later underwent skin grafting with Dr. Sharol Given. Her right foot wound previously healed.  Past Medical History:  Diagnosis Date   Arthritis    Diabetes mellitus without complication (HCC)    type 2   GERD (gastroesophageal reflux disease)    Hypercholesteremia    Hypertension    Hypothyroidism    PAD (peripheral artery disease) (HCC)    PONV (postoperative nausea and vomiting)    Thyroid disease     Past Surgical History:  Procedure Laterality Date   ABDOMINAL AORTOGRAM W/LOWER EXTREMITY Bilateral 01/14/2020   Procedure: ABDOMINAL AORTOGRAM W/LOWER EXTREMITY;  Surgeon: Marty Heck, MD;  Location: [place] [facility];  Service: Cardiovascular;  Laterality: Bilateral;   AMPUTATION TOE     PERIPHERAL VASCULAR ATHERECTOMY Right 01/14/2020   Procedure: PERIPHERAL VASCULAR ATHERECTOMY;  Surgeon: Marty Heck, MD;  Location: [place] [facility];  Service: Cardiovascular;  Laterality: Right;  sfa   PERIPHERAL VASCULAR INTERVENTION Right 01/14/2020   Procedure: PERIPHERAL VASCULAR INTERVENTION;  Surgeon: Marty Heck, MD;  Location: [place] [facility];  Service: Cardiovascular;  Laterality: Right;  sfa stent    SKIN SPLIT GRAFT Right 03/18/2020   Procedure: SKIN GRAFTING RIGHT FOOT ULCER;  Surgeon: Newt Minion, MD;  Location: [place];  Service: Orthopedics;  Laterality: Right;   WISDOM TOOTH EXTRACTION      Family History  Problem Relation Age of Onset   Diabetes Other    CAD Other     SOCIAL HISTORY: Social History   Tobacco Use   Smoking status: Former    Types: Cigarettes    Quit date: 01/2017    Years since quitting: 5.3   Smokeless tobacco: Never  Substance Use Topics   Alcohol use: Not Currently    Allergies  Allergen Reactions   Penicillins Rash    Did it involve swelling of the face/tongue/throat, SOB, or low BP? no Did it involve sudden or severe rash/hives, skin peeling, or any reaction on the inside of your mouth or nose? yes Did you need to seek medical attention at a hospital or doctor's office? yes When did it last happen?      childhood allergy If all above answers are "NO", may proceed with cephalosporin use.    Doxycycline Nausea And Vomiting    Extremely N/V   Trental [Pentoxifylline] Nausea And Vomiting    Current Outpatient Medications  Medication Sig Dispense Refill   amLODipine (NORVASC) 5 MG tablet Take 5 mg by mouth daily.     aspirin 81 MG EC tablet Take 1 tablet by mouth daily.     atorvastatin (LIPITOR) 40 MG tablet Take 40 mg by mouth daily.     clopidogrel (PLAVIX) 75 MG tablet TAKE 1 TABLET BY MOUTH EVERY DAY WITH BREAKFAST 90 tablet 3   collagenase (SANTYL) ointment Apply topically daily. 15  g 2   gabapentin (NEURONTIN) 100 MG capsule Take 300 mg by mouth at bedtime.     insulin degludec (TRESIBA FLEXTOUCH) 100 UNIT/ML FlexTouch Pen Inject 10 Units into the skin at bedtime. (Patient taking differently: Inject 14 Units into the skin at bedtime.) 3 mL 3   levothyroxine (SYNTHROID) 150 MCG tablet Take 150 mcg by mouth daily.     lisinopril-hydrochlorothiazide (ZESTORETIC) 20-25 MG tablet Take 2 tablets by mouth daily.      pantoprazole (PROTONIX) 40 MG tablet Take 40 mg by mouth daily.     acetaminophen (TYLENOL) 325 MG tablet Take 650 mg by mouth every 6 (six) hours  as needed for mild pain. (Patient not taking: Reported on 06/19/2022)     ibuprofen (ADVIL) 600 MG tablet Take 1 tablet (600 mg total) by mouth every 6 (six) hours as needed. (Patient not taking: Reported on 06/19/2022) 30 tablet 0   metFORMIN (GLUCOPHAGE-XR) 500 MG 24 hr tablet Take 1,000 mg by mouth in the morning and at bedtime. (Patient not taking: Reported on 06/19/2022)     nitroGLYCERIN (NITRODUR - DOSED IN MG/24 HR) 0.2 mg/hr patch Place 1 patch (0.2 mg total) onto the skin daily. (Patient not taking: Reported on 02/07/2021) 30 patch 12   pentoxifylline (TRENTAL) 400 MG CR tablet Take 1 tablet (400 mg total) by mouth 3 (three) times daily with meals. (Patient not taking: Reported on 02/07/2021) 90 tablet 3   No current facility-administered medications for this visit.    REVIEW OF SYSTEMS:  [X]$  denotes positive finding, [ ]$  denotes negative finding Cardiac  Comments:  Chest pain or chest pressure:    Shortness of breath upon exertion:    Short of breath when lying flat:    Irregular heart rhythm:        Vascular    Pain in calf, thigh, or hip brought on by ambulation:    Pain in feet at night that wakes you up from your sleep:     Blood clot in your veins:    Leg swelling:         Pulmonary    Oxygen at home:    Productive cough:     Wheezing:         Neurologic    Sudden weakness in arms or legs:     Sudden numbness in arms or legs:     Sudden onset of difficulty speaking or slurred speech:    Temporary loss of vision in one eye:     Problems with dizziness:         Gastrointestinal    Blood in stool:     Vomited blood:         Genitourinary    Burning when urinating:     Blood in urine:        Psychiatric    Major depression:         Hematologic    Bleeding problems:    Problems with blood clotting too easily:        Skin    Rashes or ulcers:        Constitutional    Fever or chills:      PHYSICAL EXAM: Vitals:   06/19/22 1601  BP: 121/74  Pulse: 86   Resp: 14  Temp: 97.7 F (36.5 C)  TempSrc: Temporal  SpO2: 98%  Weight: 205 lb (93 kg)  Height: 5' 4"$  (1.626 m)    GENERAL: The patient is a well-nourished female, in no  acute distress. The vital signs are documented above. CARDIAC: There is a regular rate and rhythm.  VASCULAR:  Bilateral femoral pulses palpable No palpable pedal pulses Right second toe tissue loss as pictured Left great toe tissue loss as pictured              DATA:   ABIs are 0.98 right multiphasic and 0.51 left monophasic  Right lower extremity arterial duplex shows patent SFA stent with some moderate stenosis 50-74% in the SFA  Assessment/Plan:  57 year old female status post right leg intervention with SFA atherectomy angioplasty and stent placement as well as above-knee popliteal angioplasty for CLI with tissue loss in 2021 that presents for 1 year surveillance.  Unfortunately she has developed new tissue loss in the left great toe over the last several months.  She has a known left SFA occlusion from her previous angiogram imaging.  I have recommended aortogram, lower extremity arteriogram with focus on the left leg with possible intervention.  Discussed if no endovascular options may require bypass a later date.  Will also evaluate runoff in the right leg as she does have a new right second toe ulcer as well.  Evidence of moderate SFA stenosis on the right based on duplex.  Have asked that she stay on aspirin and Plavix.  Risk benefits discussed.  Marty Heck, MD [facility] Office: [phone]

## 2022-06-20 ENCOUNTER — Other Ambulatory Visit: Payer: Self-pay

## 2022-06-20 DIAGNOSIS — I70223 Atherosclerosis of native arteries of extremities with rest pain, bilateral legs: Secondary | ICD-10-CM

## 2022-06-29 ENCOUNTER — Ambulatory Visit (INDEPENDENT_AMBULATORY_CARE_PROVIDER_SITE_OTHER): Payer: Managed Care, Other (non HMO)

## 2022-06-29 ENCOUNTER — Ambulatory Visit: Payer: Managed Care, Other (non HMO) | Admitting: Family

## 2022-06-29 DIAGNOSIS — I70223 Atherosclerosis of native arteries of extremities with rest pain, bilateral legs: Secondary | ICD-10-CM

## 2022-06-29 NOTE — Progress Notes (Unsigned)
Office Visit Note   Patient: Kayla Schmitt           Date of Birth: 1965-09-07           MRN: [id number] Visit Date: 06/29/2022              Requested by: Medicine, [facility] [address] [place],  [place] [address] PCP: Medicine, [facility]  Chief Complaint  Patient presents with  . Right Foot - Wound Check  . Left Foot - Wound Check      HPI: ***  Assessment & Plan: Visit Diagnoses:  1. Critical limb ischemia of both lower extremities ([place])     Plan: ***  Follow-Up Instructions: No follow-ups on file.   Ortho Exam  Patient is alert, oriented, no adenopathy, well-dressed, normal affect, normal respiratory effort. ***  Imaging: No results found.    Labs: Lab Results  Component Value Date   HGBA1C 11.0 (H) 01/11/2020   ESRSEDRATE 136 (H) 01/14/2020   ESRSEDRATE 126 (H) 01/13/2020   ESRSEDRATE 125 (H) 01/12/2020   CRP 11.3 (H) 01/14/2020   CRP 13.0 (H) 01/13/2020   CRP 15.8 (H) 01/12/2020   LABURIC 7.7 (H) 02/04/2022   REPTSTATUS 01/16/2020 FINAL 01/11/2020   CULT  01/11/2020    NO GROWTH 5 DAYS Performed at [facility], [address], [place], [place] [address]      Lab Results  Component Value Date   ALBUMIN 3.6 02/04/2022   ALBUMIN 2.7 (L) 01/15/2020   ALBUMIN 2.7 (L) 01/14/2020    Lab Results  Component Value Date   MG 1.4 (L) 01/13/2020   No results found for: "VD25OH"  No results found for: "PREALBUMIN"    Latest Ref Rng & Units 02/04/2022    5:21 PM 03/18/2020   12:05 PM 01/15/2020    8:36 AM  CBC EXTENDED  WBC 4.0 - 10.5 K/uL 7.8  4.6  7.7   RBC 3.87 - 5.11 MIL/uL 3.39  3.47  3.09   Hemoglobin 12.0 - 15.0 g/dL 10.1  10.0  8.6   HCT 36.0 - 46.0 % 30.8  31.3  28.0   Platelets 150 - 400 K/uL 218  203  269   NEUT# 1.7 - 7.7 K/uL 4.6     Lymph# 0.7 - 4.0 K/uL 2.3        There is no height or weight on file to calculate BMI.  Orders:  Orders Placed This Encounter  Procedures  . XR Foot 2 Views Left  . XR Foot 2  Views Right   No orders of the defined types were placed in this encounter.    Procedures: No procedures performed  Clinical Data: No additional findings.  ROS:  All other systems negative, except as noted in the HPI. Review of Systems  Objective: Vital Signs: There were no vitals taken for this visit.  Specialty Comments:  No specialty comments available.  PMFS History: Patient Active Problem List   Diagnosis Date Noted  . Critical limb ischemia of both lower extremities ([place]) 06/19/2022  . DM type 2 (diabetes mellitus, type 2) ([place]) 01/28/2020  . Hyperlipidemia 01/28/2020  . Hypertension 01/28/2020  . Hypothyroidism 01/28/2020  . Obesity 01/28/2020  . Ulcerated, foot, right, limited to breakdown of skin ([place])   . Cellulitis of right foot   . Peripheral arterial disease ([place])   . Cellulitis 01/11/2020  . Blister of leg 01/06/2020  . 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/28/2017  . Chest pain 10/28/2017  .  GERD (gastroesophageal reflux disease) 10/28/2017   Past Medical History:  Diagnosis Date  . Arthritis   . Diabetes mellitus without complication ([place])    type 2  . GERD (gastroesophageal reflux disease)   . Hypercholesteremia   . Hypertension   . Hypothyroidism   . PAD (peripheral artery disease) ([place])   . PONV (postoperative nausea and vomiting)   . Thyroid disease     Family History  Problem Relation Age of Onset  . Diabetes Other   . CAD Other     Past Surgical History:  Procedure Laterality Date  . ABDOMINAL AORTOGRAM W/LOWER EXTREMITY Bilateral 01/14/2020   Procedure: ABDOMINAL AORTOGRAM W/LOWER EXTREMITY;  Surgeon: Marty Heck, MD;  Location: [facility];  Service: Cardiovascular;  Laterality: Bilateral;  . AMPUTATION TOE    . PERIPHERAL VASCULAR ATHERECTOMY Right 01/14/2020   Procedure: PERIPHERAL VASCULAR ATHERECTOMY;  Surgeon: Marty Heck, MD;  Location: [facility] [facility];  Service: Cardiovascular;  Laterality:  Right;  sfa  . PERIPHERAL VASCULAR INTERVENTION Right 01/14/2020   Procedure: PERIPHERAL VASCULAR INTERVENTION;  Surgeon: Marty Heck, MD;  Location: [place] [facility];  Service: Cardiovascular;  Laterality: Right;  sfa stent   . SKIN SPLIT GRAFT Right 03/18/2020   Procedure: SKIN GRAFTING RIGHT FOOT ULCER;  Surgeon: Newt Minion, MD;  Location: [place];  Service: Orthopedics;  Laterality: Right;  . WISDOM TOOTH EXTRACTION     Social History   Occupational History  . Not on file  Tobacco Use  . Smoking status: Former    Types: Cigarettes    Quit date: 01/2017    Years since quitting: 5.4  . Smokeless tobacco: Never  Vaping Use  . Vaping Use: Never used  Substance and Sexual Activity  . Alcohol use: Not Currently  . Drug use: Yes    Types: Marijuana    Comment: last use 03/12/20 - uses twice a month per pt 03/16/20  . Sexual activity: Yes    Birth control/protection: Post-menopausal

## 2022-07-03 ENCOUNTER — Ambulatory Visit: Payer: Managed Care, Other (non HMO) | Admitting: Orthopedic Surgery

## 2022-07-03 ENCOUNTER — Encounter: Payer: Self-pay | Admitting: Orthopedic Surgery

## 2022-07-03 DIAGNOSIS — I70223 Atherosclerosis of native arteries of extremities with rest pain, bilateral legs: Secondary | ICD-10-CM | POA: Diagnosis not present

## 2022-07-03 MED ORDER — PENTOXIFYLLINE ER 400 MG PO TBCR: 400.0000 mg | EXTENDED_RELEASE_TABLET | Freq: Three times a day (TID) | ORAL | 3 refills | Status: DC

## 2022-07-03 MED ORDER — NITROGLYCERIN 0.2 MG/HR TD PT24: 0.2000 mg | MEDICATED_PATCH | Freq: Every day | TRANSDERMAL | 12 refills | Status: DC

## 2022-07-03 NOTE — Progress Notes (Signed)
Office Visit Note   Patient: Kayla Schmitt           Date of Birth: 13-Apr-1966           MRN: [id number] Visit Date: 07/03/2022              Requested by: Medicine, [facility] [place],  [place] [address] PCP: Medicine, [facility]  Chief Complaint  Patient presents with   Right Foot - Wound Check   Left Foot - Wound Check      HPI: Patient is a 57 year old woman who presents in follow-up for critical limb ischemia of both feet.  Patient states that she is set up for endovascular intervention for the left lower extremity this week.  Assessment & Plan: Visit Diagnoses: No diagnosis found.  Plan: Will try nitroglycerin and Trental again.  She will try the Trental on a full stomach to see if this decreases her nausea symptoms.  She will use a nitroglycerin patch on each foot daily.  Follow-up in 2 weeks.  She will follow-up sooner if she develops any ascending cellulitis.  Follow-Up Instructions: No follow-ups on file.   Ortho Exam  Patient is alert, oriented, no adenopathy, well-dressed, normal affect, normal respiratory effort. 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  There is no cellulitis proximal to the MTP joint.  Examination of the left foot she has gangrenous changes of the great toe with no cellulitis proximal to the MTP joint.  Imaging: No results found. No images are attached to the encounter.  Labs: Lab Results  Component Value Date   HGBA1C 11.0 (H) 01/11/2020   ESRSEDRATE 136 (H) 01/14/2020   ESRSEDRATE 126 (H) 01/13/2020   ESRSEDRATE 125 (H) 01/12/2020   CRP 11.3 (H) 01/14/2020   CRP 13.0 (H) 01/13/2020   CRP 15.8 (H) 01/12/2020   LABURIC 7.7 (H) 02/04/2022   REPTSTATUS 01/16/2020 FINAL 01/11/2020   CULT  01/11/2020    NO GROWTH 5 DAYS Performed at [facility], [address], [place], [place] [address]      Lab Results  Component Value Date   ALBUMIN 3.6 02/04/2022    ALBUMIN 2.7 (L) 01/15/2020   ALBUMIN 2.7 (L) 01/14/2020    Lab Results  Component Value Date   MG 1.4 (L) 01/13/2020   No results found for: "VD25OH"  No results found for: "PREALBUMIN"    Latest Ref Rng & Units 02/04/2022    5:21 PM 03/18/2020   12:05 PM 01/15/2020    8:36 AM  CBC EXTENDED  WBC 4.0 - 10.5 K/uL 7.8  4.6  7.7   RBC 3.87 - 5.11 MIL/uL 3.39  3.47  3.09   Hemoglobin 12.0 - 15.0 g/dL 10.1  10.0  8.6   HCT 36.0 - 46.0 % 30.8  31.3  28.0   Platelets 150 - 400 K/uL 218  203  269   NEUT# 1.7 - 7.7 K/uL 4.6     Lymph# 0.7 - 4.0 K/uL 2.3        There is no height or weight on file to calculate BMI.  Orders:  No orders of the defined types were placed in this encounter.  Meds ordered this encounter  Medications   pentoxifylline (TRENTAL) 400 MG CR tablet    Sig: Take 1 tablet (400 mg total) by mouth 3 (three) times daily with meals.    Dispense:  90 tablet    Refill:  3   nitroGLYCERIN (NITRODUR - DOSED IN MG/24 HR) 0.2 mg/hr patch    Sig: Place 1 patch (0.2 mg total) onto the skin daily.    Dispense:  30 patch    Refill:  12     Procedures: No procedures performed  Clinical Data: No additional findings.  ROS:  All other systems negative, except as noted in the HPI. Review of Systems  Objective: Vital Signs: There were no vitals taken for this visit.  Specialty Comments:  No specialty comments available.  PMFS History: Patient Active Problem List   Diagnosis Date Noted   Critical limb ischemia of both lower extremities ([place]) 06/19/2022   DM type 2 (diabetes mellitus, type 2) ([place]) 01/28/2020   Hyperlipidemia 01/28/2020   Hypertension 01/28/2020   Hypothyroidism 01/28/2020   Obesity 01/28/2020   Ulcerated, foot, right, limited to breakdown of skin ([facility])    Cellulitis of right foot    Peripheral arterial disease ([place])    Cellulitis 01/11/2020   Blister of leg 01/06/2020   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/28/2017   Chest pain  10/28/2017   GERD (gastroesophageal reflux disease) 10/28/2017   Past Medical History:  Diagnosis Date   Arthritis    Diabetes mellitus without complication ([facility])    type 2   GERD (gastroesophageal reflux disease)    Hypercholesteremia    Hypertension    Hypothyroidism    PAD (peripheral artery disease) ([facility])    PONV (postoperative nausea and vomiting)    Thyroid disease     Family History  Problem Relation Age of Onset   Diabetes Other    CAD Other     Past Surgical History:  Procedure Laterality Date   ABDOMINAL AORTOGRAM W/LOWER EXTREMITY Bilateral 01/14/2020   Procedure: ABDOMINAL AORTOGRAM W/LOWER EXTREMITY;  Surgeon: Marty Heck, MD;  Location: [facility];  Service: Cardiovascular;  Laterality: Bilateral;   AMPUTATION TOE     PERIPHERAL VASCULAR ATHERECTOMY Right 01/14/2020   Procedure: PERIPHERAL VASCULAR ATHERECTOMY;  Surgeon: Marty Heck, MD;  Location: [facility] [place] [facility];  Service: Cardiovascular;  Laterality: Right;  sfa   PERIPHERAL VASCULAR INTERVENTION Right 01/14/2020   Procedure: PERIPHERAL VASCULAR INTERVENTION;  Surgeon: Marty Heck, MD;  Location: [facility];  Service: Cardiovascular;  Laterality: Right;  sfa stent    SKIN SPLIT GRAFT Right 03/18/2020   Procedure: SKIN GRAFTING RIGHT FOOT ULCER;  Surgeon: Newt Minion, MD;  Location: [facility];  Service: Orthopedics;  Laterality: Right;   WISDOM TOOTH EXTRACTION     Social History   Occupational History   Not on file  Tobacco Use   Smoking status: Former    Types: Cigarettes    Quit date: 01/2017    Years since quitting: 5.4   Smokeless tobacco: Never  Vaping Use   Vaping Use: Never used  Substance and Sexual Activity   Alcohol use: Not Currently   Drug use: Yes    Types: Marijuana    Comment: last use 03/12/20 - uses twice a month per pt 03/16/20   Sexual activity: Yes    Birth control/protection: Post-menopausal

## 2022-07-05 ENCOUNTER — Other Ambulatory Visit: Payer: Self-pay

## 2022-07-05 ENCOUNTER — Ambulatory Visit (HOSPITAL_COMMUNITY)
Admission: RE | Admit: 2022-07-05 | Discharge: 2022-07-05 | Disposition: A | Discharge status: Home / Self Care | Payer: Managed Care, Other (non HMO) | Attending: Vascular Surgery | Admitting: Vascular Surgery

## 2022-07-05 ENCOUNTER — Ambulatory Visit (HOSPITAL_COMMUNITY)
Admission: RE | Disposition: A | Discharge status: Home / Self Care | Payer: Self-pay | Source: Home / Self Care | Attending: Vascular Surgery

## 2022-07-05 DIAGNOSIS — Z7984 Long term (current) use of oral hypoglycemic drugs: Secondary | ICD-10-CM | POA: Diagnosis not present

## 2022-07-05 DIAGNOSIS — E785 Hyperlipidemia, unspecified: Secondary | ICD-10-CM | POA: Diagnosis not present

## 2022-07-05 DIAGNOSIS — I70245 Atherosclerosis of native arteries of left leg with ulceration of other part of foot: Secondary | ICD-10-CM | POA: Insufficient documentation

## 2022-07-05 DIAGNOSIS — E11621 Type 2 diabetes mellitus with foot ulcer: Secondary | ICD-10-CM | POA: Diagnosis not present

## 2022-07-05 DIAGNOSIS — Z87891 Personal history of nicotine dependence: Secondary | ICD-10-CM | POA: Diagnosis not present

## 2022-07-05 DIAGNOSIS — L97519 Non-pressure chronic ulcer of other part of right foot with unspecified severity: Secondary | ICD-10-CM | POA: Diagnosis not present

## 2022-07-05 DIAGNOSIS — Z7902 Long term (current) use of antithrombotics/antiplatelets: Secondary | ICD-10-CM | POA: Insufficient documentation

## 2022-07-05 DIAGNOSIS — Z794 Long term (current) use of insulin: Secondary | ICD-10-CM | POA: Insufficient documentation

## 2022-07-05 DIAGNOSIS — E1151 Type 2 diabetes mellitus with diabetic peripheral angiopathy without gangrene: Secondary | ICD-10-CM | POA: Insufficient documentation

## 2022-07-05 DIAGNOSIS — I10 Essential (primary) hypertension: Secondary | ICD-10-CM | POA: Diagnosis not present

## 2022-07-05 DIAGNOSIS — L97529 Non-pressure chronic ulcer of other part of left foot with unspecified severity: Secondary | ICD-10-CM | POA: Insufficient documentation

## 2022-07-05 DIAGNOSIS — I70223 Atherosclerosis of native arteries of extremities with rest pain, bilateral legs: Secondary | ICD-10-CM

## 2022-07-05 HISTORY — PX: PERIPHERAL VASCULAR INTERVENTION: CATH118257

## 2022-07-05 HISTORY — PX: ABDOMINAL AORTOGRAM W/LOWER EXTREMITY: CATH118223

## 2022-07-05 LAB — GLUCOSE, CAPILLARY
Glucose-Capillary: 113 mg/dL — ABNORMAL HIGH (ref 70–99)
Glucose-Capillary: 115 mg/dL — ABNORMAL HIGH (ref 70–99)

## 2022-07-05 LAB — POCT I-STAT, CHEM 8
BUN: 30 mg/dL — ABNORMAL HIGH (ref 6–20)
Calcium, Ion: 1.21 mmol/L (ref 1.15–1.40)
Chloride: 105 mmol/L (ref 98–111)
Creatinine, Ser: 1.3 mg/dL — ABNORMAL HIGH (ref 0.44–1.00)
Glucose, Bld: 135 mg/dL — ABNORMAL HIGH (ref 70–99)
HCT: 25 % — ABNORMAL LOW (ref 36.0–46.0)
Hemoglobin: 8.5 g/dL — ABNORMAL LOW (ref 12.0–15.0)
Potassium: 4 mmol/L (ref 3.5–5.1)
Sodium: 140 mmol/L (ref 135–145)
TCO2: 26 mmol/L (ref 22–32)

## 2022-07-05 LAB — POCT ACTIVATED CLOTTING TIME
Activated Clotting Time: 223 seconds
Activated Clotting Time: 244 seconds
Activated Clotting Time: 163 seconds

## 2022-07-05 SURGERY — ABDOMINAL AORTOGRAM W/LOWER EXTREMITY
Anesthesia: LOCAL

## 2022-07-05 MED ORDER — PROTAMINE SULFATE 10 MG/ML IV SOLN
INTRAVENOUS | Status: AC
  Filled 2022-07-05: qty 5

## 2022-07-05 MED ORDER — HEPARIN SODIUM (PORCINE) 1000 UNIT/ML IJ SOLN
INTRAMUSCULAR | Status: AC
  Filled 2022-07-05: qty 10

## 2022-07-05 MED ORDER — LABETALOL HCL 5 MG/ML IV SOLN: 10.0000 mg | INTRAVENOUS | Status: DC | PRN

## 2022-07-05 MED ORDER — MIDAZOLAM HCL 2 MG/2ML IJ SOLN
INTRAMUSCULAR | Status: DC | PRN
  Administered 2022-07-05: 1 mg via INTRAVENOUS

## 2022-07-05 MED ORDER — MIDAZOLAM HCL 5 MG/5ML IJ SOLN
INTRAMUSCULAR | Status: AC
  Filled 2022-07-05: qty 5

## 2022-07-05 MED ORDER — SODIUM CHLORIDE 0.9% FLUSH: 3.0000 mL | INTRAVENOUS | Status: DC | PRN

## 2022-07-05 MED ORDER — CLOPIDOGREL BISULFATE 75 MG PO TABS: 75.0000 mg | ORAL_TABLET | Freq: Every day | ORAL | Status: DC

## 2022-07-05 MED ORDER — FENTANYL CITRATE (PF) 100 MCG/2ML IJ SOLN
INTRAMUSCULAR | Status: AC
  Filled 2022-07-05: qty 2

## 2022-07-05 MED ORDER — ASPIRIN 81 MG PO CHEW
CHEWABLE_TABLET | ORAL | Status: DC | PRN
  Administered 2022-07-05: 81 mg via ORAL

## 2022-07-05 MED ORDER — LIDOCAINE HCL (PF) 1 % IJ SOLN
INTRAMUSCULAR | Status: DC | PRN
  Administered 2022-07-05: 10 mL

## 2022-07-05 MED ORDER — CLOPIDOGREL BISULFATE 75 MG PO TABS
ORAL_TABLET | ORAL | Status: AC
  Filled 2022-07-05: qty 1

## 2022-07-05 MED ORDER — ONDANSETRON HCL 4 MG/2ML IJ SOLN: 4.0000 mg | Freq: Four times a day (QID) | INTRAMUSCULAR | Status: DC | PRN

## 2022-07-05 MED ORDER — LIDOCAINE HCL (PF) 1 % IJ SOLN
INTRAMUSCULAR | Status: AC
  Filled 2022-07-05: qty 30

## 2022-07-05 MED ORDER — FENTANYL CITRATE (PF) 100 MCG/2ML IJ SOLN
INTRAMUSCULAR | Status: DC | PRN
  Administered 2022-07-05 (×3): 25 ug via INTRAVENOUS

## 2022-07-05 MED ORDER — HYDRALAZINE HCL 20 MG/ML IJ SOLN: 5.0000 mg | INTRAMUSCULAR | Status: DC | PRN

## 2022-07-05 MED ORDER — SODIUM CHLORIDE 0.9 % IV SOLN: 250.0000 mL | INTRAVENOUS | Status: DC | PRN

## 2022-07-05 MED ORDER — CLOPIDOGREL BISULFATE 300 MG PO TABS
ORAL_TABLET | ORAL | Status: DC | PRN
  Administered 2022-07-05: 75 mg via ORAL

## 2022-07-05 MED ORDER — IODIXANOL 320 MG/ML IV SOLN
INTRAVENOUS | Status: DC | PRN
  Administered 2022-07-05: 160 mL

## 2022-07-05 MED ORDER — ACETAMINOPHEN 325 MG PO TABS
650.0000 mg | ORAL_TABLET | ORAL | Status: DC | PRN
  Administered 2022-07-05: 650 mg via ORAL
  Filled 2022-07-05: qty 2

## 2022-07-05 MED ORDER — ASPIRIN 81 MG PO CHEW
CHEWABLE_TABLET | ORAL | Status: AC
  Filled 2022-07-05: qty 1

## 2022-07-05 MED ORDER — ASPIRIN 81 MG PO TBEC: 81.0000 mg | DELAYED_RELEASE_TABLET | Freq: Every day | ORAL | Status: DC

## 2022-07-05 MED ORDER — SODIUM CHLORIDE 0.9% FLUSH: 3.0000 mL | Freq: Two times a day (BID) | INTRAVENOUS | Status: DC

## 2022-07-05 MED ORDER — SODIUM CHLORIDE 0.9 % IV SOLN: INTRAVENOUS | Status: DC

## 2022-07-05 MED ORDER — HEPARIN (PORCINE) IN NACL 1000-0.9 UT/500ML-% IV SOLN
INTRAVENOUS | Status: DC | PRN
  Administered 2022-07-05 (×2): 500 mL

## 2022-07-05 MED ORDER — PROTAMINE SULFATE 10 MG/ML IV SOLN
30.0000 mg | Freq: Once | INTRAVENOUS | Status: AC
  Administered 2022-07-05: 30 mg via INTRAVENOUS

## 2022-07-05 MED ORDER — SODIUM CHLORIDE 0.9 % IV SOLN: INTRAVENOUS | Status: AC

## 2022-07-05 MED ORDER — HEPARIN SODIUM (PORCINE) 1000 UNIT/ML IJ SOLN
INTRAMUSCULAR | Status: DC | PRN
  Administered 2022-07-05: 9000 [IU] via INTRAVENOUS
  Administered 2022-07-05: 2000 [IU] via INTRAVENOUS

## 2022-07-05 SURGICAL SUPPLY — 29 items
BALLN MUSTANG 4X150X135 (BALLOONS) ×2
BALLN MUSTANG 5X150X135 (BALLOONS) ×2
BALLOON MUSTANG 4X150X135 (BALLOONS) IMPLANT
BALLOON MUSTANG 5X150X135 (BALLOONS) IMPLANT
CATH MUSTANG 3X150X135 (BALLOONS) IMPLANT
CATH MUSTANG 3X200X135 (BALLOONS) IMPLANT
CATH NAVICROSS ST .035X90CM (MICROCATHETER) IMPLANT
CATH OMNI FLUSH 5F 65CM (CATHETERS) IMPLANT
CATH QUICKCROSS .035X135CM (MICROCATHETER) IMPLANT
CATH QUICKCROSS SUPP .035X90CM (MICROCATHETER) IMPLANT
DEVICE TORQUE .025-.038 (MISCELLANEOUS) IMPLANT
GLIDEWIRE ADV .035X260CM (WIRE) IMPLANT
KIT ENCORE 26 ADVANTAGE (KITS) IMPLANT
KIT MICROPUNCTURE NIT STIFF (SHEATH) IMPLANT
KIT PV (KITS) ×3 IMPLANT
SHEATH CATAPULT 6F 45 MP (SHEATH) IMPLANT
SHEATH PINNACLE 5F 10CM (SHEATH) IMPLANT
SHEATH PINNACLE 6F 10CM (SHEATH) IMPLANT
SHEATH PROBE COVER 6X72 (BAG) IMPLANT
STENT ELUVIA 6X150X130 (Permanent Stent) IMPLANT
STENT ELUVIA 6X80X130 (Permanent Stent) IMPLANT
STOPCOCK MORSE 400PSI 3WAY (MISCELLANEOUS) IMPLANT
SYR MEDRAD MARK 7 150ML (SYRINGE) ×3 IMPLANT
TRANSDUCER W/STOPCOCK (MISCELLANEOUS) ×3 IMPLANT
TRAY PV CATH (CUSTOM PROCEDURE TRAY) ×3 IMPLANT
TUBING CIL FLEX 10 FLL-RA (TUBING) IMPLANT
WIRE BENTSON .035X145CM (WIRE) IMPLANT
WIRE G V18X300CM (WIRE) IMPLANT
WIRE ROSEN-J .035X260CM (WIRE) IMPLANT

## 2022-07-05 NOTE — Progress Notes (Signed)
ACT 163. Patient unable to void with purewick catheter in place. States her bladder feels really full. BP stable 157/63. Straight intermittent urine catheter insert per verbal order. 800 ml yellow urine output noted. Will prepare to remove right groin femoral sheath.Cindee Salt

## 2022-07-05 NOTE — Progress Notes (Signed)
28f right groin femoral sheath removed intact per SOletta Lamas([email or web address]). Manual pressure applied x 30 min. No bleeding or hematoma noted. Post activity and precautions explained. Patient verbalized understanding. Transported to short stay for remaining recovery period. Care released to short stay RN.BCindee Salt

## 2022-07-05 NOTE — Progress Notes (Signed)
Pt ambulated to and from bathroom to void with no signs of oozing from groin site

## 2022-07-05 NOTE — H&P (Signed)
History and Physical Interval Note:  07/05/2022 9:31 AM  Kayla Schmitt  has presented today for surgery, with the diagnosis of critical limb ischemia with wound.  The various methods of treatment have been discussed with the patient and family. After consideration of risks, benefits and other options for treatment, the patient has consented to  Procedure(s): ABDOMINAL AORTOGRAM W/LOWER EXTREMITY (N/A) as a surgical intervention.  The patient's history has been reviewed, patient examined, no change in status, stable for surgery.  I have reviewed the patient's chart and labs.  Questions were answered to the patient's satisfaction.    Bilateral lower extremity wounds.  Focus on left leg today for intervention.    Marty Heck     Patient name: Kayla Schmitt            MRN: [id number]        DOB: 08-20-1965            Sex: female   REASON FOR VISIT: 1 year follow-up for surveillance of PAD   HPI: Kayla Schmitt is a 57 y.o. female history of hypertension, hyperlipidemia, diabetes that presents for surveillance of her right leg PAD.  She reports she has now developed an ulcer on her left big toe that has been present for several months and not healing.  She has also developed an ulcer on her right second toe.   She previously underwent right leg intervention on 01/14/2020 with right SFA atherectomy and angioplasty and stent placement as well as right above-knee popliteal angioplasty.   She later underwent skin grafting with Dr. Sharol Given. Her right foot wound previously healed.       Past Medical History:  Diagnosis Date   Arthritis     Diabetes mellitus without complication (H[facility])      type 2   GERD (gastroesophageal reflux disease)     Hypercholesteremia     Hypertension     Hypothyroidism     PAD (peripheral artery disease) ([facility])     PONV (postoperative nausea and vomiting)     Thyroid disease             Past Surgical History:  Procedure Laterality Date    ABDOMINAL AORTOGRAM W/LOWER EXTREMITY Bilateral 01/14/2020    Procedure: ABDOMINAL AORTOGRAM W/LOWER EXTREMITY;  Surgeon: Marty Heck, MD;  Location: [facility];  Service: Cardiovascular;  Laterality: Bilateral;   AMPUTATION TOE       PERIPHERAL VASCULAR ATHERECTOMY Right 01/14/2020    Procedure: PERIPHERAL VASCULAR ATHERECTOMY;  Surgeon: Marty Heck, MD;  Location: [facility];  Service: Cardiovascular;  Laterality: Right;  sfa   PERIPHERAL VASCULAR INTERVENTION Right 01/14/2020    Procedure: PERIPHERAL VASCULAR INTERVENTION;  Surgeon: Marty Heck, MD;  Location: [facility];  Service: Cardiovascular;  Laterality: Right;  sfa stent    SKIN SPLIT GRAFT Right 03/18/2020    Procedure: SKIN GRAFTING RIGHT FOOT ULCER;  Surgeon: Newt Minion, MD;  Location: [facility];  Service: Orthopedics;  Laterality: Right;   WISDOM TOOTH EXTRACTION               Family History  Problem Relation Age of Onset   Diabetes Other     CAD Other        SOCIAL HISTORY: Social History         Tobacco Use   Smoking status: Former      Types: Cigarettes      Quit date: 01/2017  Years since quitting: 5.3   Smokeless tobacco: Never  Substance Use Topics   Alcohol use: Not Currently           Allergies  Allergen Reactions   Penicillins Rash      Did it involve swelling of the face/tongue/throat, SOB, or low BP? no Did it involve sudden or severe rash/hives, skin peeling, or any reaction on the inside of your mouth or nose? yes Did you need to seek medical attention at a hospital or doctor's office? yes When did it last happen?      childhood allergy If all above answers are "NO", may proceed with cephalosporin use.     Doxycycline Nausea And Vomiting      Extremely N/V   Trental [Pentoxifylline] Nausea And Vomiting            Current Outpatient Medications  Medication Sig Dispense Refill   amLODipine (NORVASC) 5 MG tablet Take 5 mg by mouth daily.        aspirin 81 MG EC tablet Take 1 tablet by mouth daily.       atorvastatin (LIPITOR) 40 MG tablet Take 40 mg by mouth daily.       clopidogrel (PLAVIX) 75 MG tablet TAKE 1 TABLET BY MOUTH EVERY DAY WITH BREAKFAST 90 tablet 3   collagenase (SANTYL) ointment Apply topically daily. 15 g 2   gabapentin (NEURONTIN) 100 MG capsule Take 300 mg by mouth at bedtime.       insulin degludec (TRESIBA FLEXTOUCH) 100 UNIT/ML FlexTouch Pen Inject 10 Units into the skin at bedtime. (Patient taking differently: Inject 14 Units into the skin at bedtime.) 3 mL 3   levothyroxine (SYNTHROID) 150 MCG tablet Take 150 mcg by mouth daily.       lisinopril-hydrochlorothiazide (ZESTORETIC) 20-25 MG tablet Take 2 tablets by mouth daily.        pantoprazole (PROTONIX) 40 MG tablet Take 40 mg by mouth daily.       acetaminophen (TYLENOL) 325 MG tablet Take 650 mg by mouth every 6 (six) hours as needed for mild pain. (Patient not taking: Reported on 06/19/2022)       ibuprofen (ADVIL) 600 MG tablet Take 1 tablet (600 mg total) by mouth every 6 (six) hours as needed. (Patient not taking: Reported on 06/19/2022) 30 tablet 0   metFORMIN (GLUCOPHAGE-XR) 500 MG 24 hr tablet Take 1,000 mg by mouth in the morning and at bedtime. (Patient not taking: Reported on 06/19/2022)       nitroGLYCERIN (NITRODUR - DOSED IN MG/24 HR) 0.2 mg/hr patch Place 1 patch (0.2 mg total) onto the skin daily. (Patient not taking: Reported on 02/07/2021) 30 patch 12   pentoxifylline (TRENTAL) 400 MG CR tablet Take 1 tablet (400 mg total) by mouth 3 (three) times daily with meals. (Patient not taking: Reported on 02/07/2021) 90 tablet 3    No current facility-administered medications for this visit.      REVIEW OF SYSTEMS:  '[X]'$  denotes positive finding, '[ ]'$  denotes negative finding Cardiac   Comments:  Chest pain or chest pressure:      Shortness of breath upon exertion:      Short of breath when lying flat:      Irregular heart rhythm:              Vascular      Pain in calf, thigh, or hip brought on by ambulation:      Pain in feet at night that wakes you up  from your sleep:       Blood clot in your veins:      Leg swelling:              Pulmonary      Oxygen at home:      Productive cough:       Wheezing:              Neurologic      Sudden weakness in arms or legs:       Sudden numbness in arms or legs:       Sudden onset of difficulty speaking or slurred speech:      Temporary loss of vision in one eye:       Problems with dizziness:              Gastrointestinal      Blood in stool:       Vomited blood:              Genitourinary      Burning when urinating:       Blood in urine:             Psychiatric      Major depression:              Hematologic      Bleeding problems:      Problems with blood clotting too easily:             Skin      Rashes or ulcers:             Constitutional      Fever or chills:          PHYSICAL EXAM:    Vitals:    06/19/22 1601  BP: 121/74  Pulse: 86  Resp: 14  Temp: 97.7 F (36.5 C)  TempSrc: Temporal  SpO2: 98%  Weight: 205 lb (93 kg)  Height: '5\' 4"'$  (1.626 m)      GENERAL: The patient is a well-nourished female, in no acute distress. The vital signs are documented above. CARDIAC: There is a regular rate and rhythm.  VASCULAR:  Bilateral femoral pulses palpable No palpable pedal pulses Right second toe tissue loss as pictured Left great toe tissue loss as pictured                        DATA:    ABIs are 0.98 right multiphasic and 0.51 left monophasic   Right lower extremity arterial duplex shows patent SFA stent with some moderate stenosis 50-74% in the SFA   Assessment/Plan:   57 year old female status post right leg intervention with SFA atherectomy angioplasty and stent placement as well as above-knee popliteal angioplasty for CLI with tissue loss in 2021 that presents for 1 year surveillance.  Unfortunately she has developed new tissue  loss in the left great toe over the last several months.  She has a known left SFA occlusion from her previous angiogram imaging.  I have recommended aortogram, lower extremity arteriogram with focus on the left leg with possible intervention.  Discussed if no endovascular options may require bypass a later date.  Will also evaluate runoff in the right leg as she does have a new right second toe ulcer as well.  Evidence of moderate SFA stenosis on the right based on duplex.  Have asked that she stay on aspirin and Plavix.  Risk benefits discussed.   Marty Heck, MD [facility] Office: [phone]

## 2022-07-05 NOTE — Op Note (Signed)
Patient name: Kayla Schmitt MRN: [id number] DOB: [birth year] Sex: female  07/05/2022 Pre-operative Diagnosis: Critical limb ischemia of the left lower extremity with tissue loss Post-operative diagnosis:  Same Surgeon:  Marty Heck, MD Procedure Performed: 1.  Ultrasound-guided access right common femoral artery 2.  Aortogram with catheter selection of aorta 3.  Left lower extremity arteriogram with selection of third order branches 4.  Left SFA and above-knee popliteal angioplasty with stent placement for chronic total occlusion (6 mm x 150 mm drug-coated Eluvia x 2 and 6 mm x 80 mm drug-coated Eluvia all postdilated with a 5 mm Mustang) 5.  99 minutes of monitored moderate conscious sedation time  Contrast: 160 mL  Indications: Patient is a 57 year old female well-known to vascular surgery having previously undergone a right SFA intervention for tissue loss.  Recently presented to the office with new tissue loss on the contralateral left leg.  She presents today for aortogram, lower extremity arteriogram, with a focus on the left leg with possible intervention after risk benefits discussed.  Findings:   Aortogram showed no flow-limiting stenosis in the aortoiliac segment.  The left common femoral was patent.  The left profunda had a high-grade calcified stenosis in the proximal vessel.  The SFA had a small stump proximally and ultimately had a long segment chronic total occlusion that was heavily calcified with reconstitution of the above-knee popliteal artery distal to Hunter's canal.  Distally popliteal artery was patent but diseased with no flow-limiting stenosis and she has three-vessel runoff below the knee into the foot.  The left SFA chronic total occlusion was crossed antegrade from contralateral groin access.  I had to predilate the entire SFA with a 3 mm and then 4 mm angioplasty balloon in order to get my stents to track.  The left above-knee popliteal artery and  entire SFA was then stented with 6 mm drug-coated Eluvia and all postdilated with a 5 mm balloon.  She now has three-vessel runoff with inline flow down the left lower extremity.   Procedure:  The patient was identified in the holding area and taken to room 2.  The patient was then placed supine on the table and prepped and draped in the usual sterile fashion.  A time out was called.  The patient received Versed and fentanyl for conscious moderate sedation.  Vital signs were monitored including heart rate, respiratory rate, oxygenation and blood pressure.  I was present for all of sedation.  Ultrasound was used to evaluate the right common femoral artery.  It was patent .  A digital ultrasound image was acquired.  A micropuncture needle was used to access the right common femoral artery under ultrasound guidance.  An 018 wire was advanced without resistance and a micropuncture sheath was placed.  The 018 wire was removed and a benson wire was placed.  The micropuncture sheath was exchanged for a 5 french sheath.  An omniflush catheter was advanced over the wire to the level of L-1.  An abdominal angiogram was obtained.  Next, using the omniflush catheter and a benson wire, the aortic bifurcation was crossed and the catheter was placed into theleft external iliac artery and left runoff was obtained.  I elected to try and intervene on her chronic total occlusion in the left SFA.  I used a Glidewire advantage into the proximal stump of left SFA and exchanged for a long 6 French sheath in the right groin over the aortic bifurcation.  Patient was given 100 units/kg  IV heparin.  I then used a Glidewire advantage with the initial quick cross catheter to get through the occluded left SFA.  I had trouble getting the quick cross to track so had to exchanged for a glide catheter.  I finally was able to cross the lesion and it appeared to stay in the true lumen and confirmed with hand-injection the above-knee popliteal artery  that I was true lumen distally.  I knew I would be unable to get any stents to track so I predilated the entire SFA above-knee popliteal artery occlusion with 3 mm Mustang.  I then selected 6 mm Eluvia stents but I could not get these to track through the left SFA so then I upsized to a 4 mm Mustang and the entire left SFA was then angioplastied with a 4 mm Mustang get luminal gain.  After multitude of wire exchanges and sheath exchange I finally had enough support to get my stent to track and I placed 6 mm x 150 mm Eluvia in the above-knee popliteal artery deployed across Hunter's canal into the SFA and treated this with a 5 mm Mustang then deployed another 6 mm x 150 mm Eluvia and finally an 6 mm x 80 mm Eluvia just adjacent to the SFA ostium.  All this was treated with a 5 mm Mustang.  Final imaging showed inline flow down the left SFA stents with no significant residual stenosis and three-vessel runoff distally.  Wires and catheters were removed.  I put a short 6 French sheath in the right groin.  Not a candidate for closure.  Taken holding in stable condition for sheath removal.   Plan: Excellent results after left leg revascularization.  Now has inline flow down the left leg.  Will follow-up in 1 month with left leg arterial duplex and ABIs.  She is on aspirin Plavix statin.  Marty Heck, MD [facility] Office: [phone]

## 2022-07-06 ENCOUNTER — Encounter (HOSPITAL_COMMUNITY): Payer: Self-pay | Admitting: Vascular Surgery

## 2022-07-06 MED FILL — Midazolam HCl Inj 5 MG/5ML (Base Equivalent): INTRAMUSCULAR | Qty: 1 | Status: AC

## 2022-07-10 ENCOUNTER — Encounter: Payer: Self-pay | Admitting: Family

## 2022-07-17 ENCOUNTER — Ambulatory Visit: Payer: Managed Care, Other (non HMO) | Admitting: Orthopedic Surgery

## 2022-07-17 DIAGNOSIS — I70223 Atherosclerosis of native arteries of extremities with rest pain, bilateral legs: Secondary | ICD-10-CM

## 2022-07-17 DIAGNOSIS — I96 Gangrene, not elsewhere classified: Secondary | ICD-10-CM | POA: Diagnosis not present

## 2022-07-19 ENCOUNTER — Other Ambulatory Visit: Payer: Self-pay

## 2022-07-19 ENCOUNTER — Encounter: Payer: Self-pay | Admitting: Orthopedic Surgery

## 2022-07-19 ENCOUNTER — Encounter (HOSPITAL_COMMUNITY): Payer: Self-pay | Admitting: Orthopedic Surgery

## 2022-07-19 NOTE — Progress Notes (Signed)
Office Visit Note   Patient: Kayla Schmitt           Date of Birth: Jan 26, 1966           MRN: [id number] Visit Date: 07/17/2022              Requested by: Medicine, [facility] Internal [address] [place],  [place] [address] PCP: Medicine, [facility] Internal  Chief Complaint  Patient presents with   Right Foot - Follow-up   Left Foot - Follow-up      HPI: Patient is a 57 year old woman who is seen in follow-up for bilateral lower extremity critical limb ischemia.  She has been using a nitroglycerin patch as well as Trental.  She is using the Trental twice a day secondary to nausea with the third pill.  She is using half a nitroglycerin patch on each foot.  She is status post endovascular stent placements for the left leg.  Patient complains of an odor and drainage from her great toes.  She has completed her clindamycin.  Assessment & Plan: Visit Diagnoses:  1. Critical limb ischemia of both lower extremities (HCC)   2. Gangrene of toe of both feet ([place])     Plan: With the recent revascularization will plan for amputation of the left great toe and amputation of the right great toe and right second toe.  Risk and benefits were discussed including risk of the wound not healing need for additional surgery.  Discussed the importance of minimizing weightbearing.  Patient states she understands wished to proceed at this time.  Follow-Up Instructions: Return in about 2 weeks (around 07/31/2022).   Ortho Exam  Patient is alert, oriented, no adenopathy, well-dressed, normal affect, normal respiratory effort. Examination of the left foot patient has gangrenous changes to the great toe that is moist.  There is cellulitis up to the MTP joint but not proximally.  Examination of the right foot there is dry gangrenous changes of the great toe and second toe without cellulitis.  Imaging: No results found.    Labs: Lab Results  Component Value Date   HGBA1C 11.0 (H) 01/11/2020   ESRSEDRATE 136 (H)  01/14/2020   ESRSEDRATE 126 (H) 01/13/2020   ESRSEDRATE 125 (H) 01/12/2020   CRP 11.3 (H) 01/14/2020   CRP 13.0 (H) 01/13/2020   CRP 15.8 (H) 01/12/2020   LABURIC 7.7 (H) 02/04/2022   REPTSTATUS 01/16/2020 FINAL 01/11/2020   CULT  01/11/2020    NO GROWTH 5 DAYS Performed at [facility], [address] [place], [place] [address]      Lab Results  Component Value Date   ALBUMIN 3.6 02/04/2022   ALBUMIN 2.7 (L) 01/15/2020   ALBUMIN 2.7 (L) 01/14/2020    Lab Results  Component Value Date   MG 1.4 (L) 01/13/2020   No results found for: "VD25OH"  No results found for: "PREALBUMIN"    Latest Ref Rng & Units 07/05/2022    7:24 AM 02/04/2022    5:21 PM 03/18/2020   12:05 PM  CBC EXTENDED  WBC 4.0 - 10.5 K/uL  7.8  4.6   RBC 3.87 - 5.11 MIL/uL  3.39  3.47   Hemoglobin 12.0 - 15.0 g/dL 8.5  10.1  10.0   HCT 36.0 - 46.0 % 25.0  30.8  31.3   Platelets 150 - 400 K/uL  218  203   NEUT# 1.7 - 7.7 K/uL  4.6    Lymph# 0.7 - 4.0 K/uL  2.3  There is no height or weight on file to calculate BMI.  Orders:  No orders of the defined types were placed in this encounter.  No orders of the defined types were placed in this encounter.    Procedures: No procedures performed  Clinical Data: No additional findings.  ROS:  All other systems negative, except as noted in the HPI. Review of Systems  Objective: Vital Signs: There were no vitals taken for this visit.  Specialty Comments:  No specialty comments available.  PMFS History: Patient Active Problem List   Diagnosis Date Noted   Critical limb ischemia of both lower extremities ([place]) 06/19/2022   DM type 2 (diabetes mellitus, type 2) ([place]) 01/28/2020   Hyperlipidemia 01/28/2020   Hypertension 01/28/2020   Hypothyroidism 01/28/2020   Obesity 01/28/2020   Ulcerated, foot, right, limited to breakdown of skin (HCC)    Cellulitis of right foot    Peripheral arterial disease ([place])    Cellulitis 01/11/2020   Blister  of leg 01/06/2020   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/28/2017   Chest pain 10/28/2017   GERD (gastroesophageal reflux disease) 10/28/2017   Past Medical History:  Diagnosis Date   Arthritis    Diabetes mellitus without complication (HCC)    type 2   GERD (gastroesophageal reflux disease)    Hypercholesteremia    Hypertension    Hypothyroidism    PAD (peripheral artery disease) (HCC)    PONV (postoperative nausea and vomiting)    Thyroid disease     Family History  Problem Relation Age of Onset   Diabetes Other    CAD Other     Past Surgical History:  Procedure Laterality Date   ABDOMINAL AORTOGRAM W/LOWER EXTREMITY Bilateral 01/14/2020   Procedure: ABDOMINAL AORTOGRAM W/LOWER EXTREMITY;  Surgeon: Marty Heck, MD;  Location: [place] [facility];  Service: Cardiovascular;  Laterality: Bilateral;   ABDOMINAL AORTOGRAM W/LOWER EXTREMITY N/A 07/05/2022   Procedure: ABDOMINAL AORTOGRAM W/LOWER EXTREMITY;  Surgeon: Marty Heck, MD;  Location: [place] [facility];  Service: Cardiovascular;  Laterality: N/A;   AMPUTATION TOE     PERIPHERAL VASCULAR ATHERECTOMY Right 01/14/2020   Procedure: PERIPHERAL VASCULAR ATHERECTOMY;  Surgeon: Marty Heck, MD;  Location: [place] [facility];  Service: Cardiovascular;  Laterality: Right;  sfa   PERIPHERAL VASCULAR INTERVENTION Right 01/14/2020   Procedure: PERIPHERAL VASCULAR INTERVENTION;  Surgeon: Marty Heck, MD;  Location: [place] [facility];  Service: Cardiovascular;  Laterality: Right;  sfa stent    PERIPHERAL VASCULAR INTERVENTION  07/05/2022   Procedure: PERIPHERAL VASCULAR INTERVENTION;  Surgeon: Marty Heck, MD;  Location: [place] [facility];  Service: Cardiovascular;;   SKIN SPLIT GRAFT Right 03/18/2020   Procedure: SKIN GRAFTING RIGHT FOOT ULCER;  Surgeon: Newt Minion, MD;  Location: [place];  Service: Orthopedics;  Laterality: Right;   WISDOM TOOTH EXTRACTION     Social History    Occupational History   Not on file  Tobacco Use   Smoking status: Former    Types: Cigarettes    Quit date: 01/2017    Years since quitting: 5.4   Smokeless tobacco: Never  Vaping Use   Vaping Use: Never used  Substance and Sexual Activity   Alcohol use: Not Currently   Drug use: Yes    Types: Marijuana    Comment: last use 03/12/20 - uses twice a month per pt 03/16/20   Sexual activity: Yes    Birth control/protection: Post-menopausal

## 2022-07-19 NOTE — Pre-Procedure Instructions (Signed)
PCP - Dr.Vyas [facility] Internal medicine  Cardiologist - pt denies Dr.Christopher Carlis Abbott   PPM/ICD - pt denies Device Orders -  Rep Notified -   EKG - 07/05/22 Stress Test - pt denies ECHO - 01/12/20 Cardiac Cath - pt denies  Sleep Study/CPAP - pt denies  Diabetic- type 2 Fasting Blood Sugar - 110-130 Checks Blood Sugar once a week  Last dose of GLP1 agonist-  Ozempic last dose 07/17/22 per pt GLP1 instructions: pt aware not take this today or day of surgery   Blood Thinner Instructions:Follow your surgeon's instructions on when to stop Plavix.  If no instructions were given by your surgeon then you will need to call the office to get those instructions.   Patient will let staff and Dr.Duda know she took plavix last 07/19/22 Aspirin Instructions:Follow your surgeon's instructions on when to stop Aspirin.  If no instructions were given by your surgeon then you will need to call the office to get those instructions.     ERAS Protcol - yes  COVID TEST- n/a   Anesthesia review: no   Patient verbally denies any shortness of breath, fever, cough and chest pain during phone call.     -------------  SDW INSTRUCTIONS given:   Your procedure is scheduled on 07/20/22.             Report to [facility] Main Entrance "A" at  0730  A.M., and check in at the Admitting office.             Call this number if you have problems the morning of surgery:             [phone]               Remember:             Do not eat after midnight the night before your surgery. Clear liquids until 0700 am                           Take these medicines the morning of surgery with A SIP OF WATER protonix, synthroid, amlodipine   As of today, STOP taking any Aspirin (unless otherwise instructed by your surgeon) Aleve, Naproxen, Ibuprofen, Motrin, Advil, Goody's, BC's, all herbal medications, fish oil, and all vitamins.    Diabetic instructions-DO NOT TAKE ANY Tyler Aas THE DAY OF SURGERY   ** PLEASE check your  blood sugar the morning of your surgery when you wake up and every 2 hours until you get to the Short Stay unit.   If your blood sugar is less than 70 mg/dL, you will need to treat for low blood sugar: Do not take insulin. Treat a low blood sugar (less than 70 mg/dL) with  cup of clear juice (cranberry or apple), 4 glucose tablets, OR glucose gel. Recheck blood sugar in 15 minutes after treatment (to make sure it is greater than 70 mg/dL). If your blood sugar is not greater than 70 mg/dL on recheck, call [phone] for further instructions.                       Do not wear jewelry, make up, or nail polish            Do not wear lotions, powders, perfumes/colognes, or deodorant.            Do not shave 48 hours prior to surgery.  Men may shave face and  neck.            Do not bring valuables to the hospital.            [facility] is not responsible for any belongings or valuables.   Do NOT Smoke (Tobacco/Vaping) 24 hours prior to your procedure If you use a CPAP at night, you may bring all equipment for your overnight stay.   Contacts, glasses, dentures or partials may not be worn into surgery.      For patients admitted to the hospital, discharge time will be determined by your treatment team.   Patients discharged the day of surgery will not be allowed to drive home, and someone needs to stay with them for 24 hours.     Special instructions:   [place]- Preparing For Surgery  Oral Hygiene is also important to reduce your risk of infection.  Remember - BRUSH YOUR TEETH THE MORNING OF SURGERY WITH YOUR REGULAR TOOTHPASTE   Before surgery, you can play an important role. Because skin is not sterile, your skin needs to be as free of germs as possible. You can reduce the number of germs on your skin by washing with Dial (or any antibacterial) Soap before surgery.    Please do not use if you have an allergy to antibacterial soaps. If your skin becomes reddened/irritated stop using  the Antibacterial Soap  Do not shave (including legs and underarms) for at least 48 hours prior to surgery. It is OK to shave your face.   Please follow these instructions carefully.              Shower the NIGHT BEFORE SURGERY and the MORNING OF SURGERY with (DIAL) Antibacterial Soap. Wash your body and hair with your normal shampoo/soap then rinse. Using a clean wash cloth wash from your neck down using the antibacterial soap. Wash thoroughly, paying special attention to the area where your surgery will be performed. Thoroughly rinse your body with warm water from the neck down.   Pat yourself dry with a CLEAN TOWEL.   Wear CLEAN PAJAMAS to bed the night before surgery.   Place CLEAN SHEETS on your bed the night of your surgery and DO NOT SLEEP WITH PETS.     Day of Surgery: Please shower morning of surgery using antibacterial soap Wear Clean/Comfortable clothing the morning of surgery Do not apply any deodorants/lotions.   Remember to brush your teeth WITH YOUR REGULAR TOOTHPASTE.   Questions were answered. Patient verbalized understanding of instructions.

## 2022-07-20 ENCOUNTER — Other Ambulatory Visit: Payer: Self-pay

## 2022-07-20 ENCOUNTER — Ambulatory Visit (HOSPITAL_BASED_OUTPATIENT_CLINIC_OR_DEPARTMENT_OTHER): Payer: Managed Care, Other (non HMO) | Admitting: Anesthesiology

## 2022-07-20 ENCOUNTER — Encounter (HOSPITAL_COMMUNITY): Payer: Self-pay | Admitting: Orthopedic Surgery

## 2022-07-20 ENCOUNTER — Ambulatory Visit (HOSPITAL_COMMUNITY)
Admission: RE | Admit: 2022-07-20 | Discharge: 2022-07-20 | Disposition: A | Discharge status: Home / Self Care | Payer: Managed Care, Other (non HMO) | Source: Ambulatory Visit | Attending: Orthopedic Surgery | Admitting: Orthopedic Surgery

## 2022-07-20 ENCOUNTER — Encounter (HOSPITAL_COMMUNITY)
Admission: RE | Disposition: A | Discharge status: Home / Self Care | Payer: Self-pay | Source: Ambulatory Visit | Attending: Orthopedic Surgery

## 2022-07-20 ENCOUNTER — Ambulatory Visit (HOSPITAL_COMMUNITY): Payer: Managed Care, Other (non HMO) | Admitting: Anesthesiology

## 2022-07-20 DIAGNOSIS — E1152 Type 2 diabetes mellitus with diabetic peripheral angiopathy with gangrene: Secondary | ICD-10-CM | POA: Diagnosis present

## 2022-07-20 DIAGNOSIS — I96 Gangrene, not elsewhere classified: Secondary | ICD-10-CM | POA: Insufficient documentation

## 2022-07-20 DIAGNOSIS — I70223 Atherosclerosis of native arteries of extremities with rest pain, bilateral legs: Secondary | ICD-10-CM | POA: Diagnosis not present

## 2022-07-20 DIAGNOSIS — I10 Essential (primary) hypertension: Secondary | ICD-10-CM

## 2022-07-20 DIAGNOSIS — Z7902 Long term (current) use of antithrombotics/antiplatelets: Secondary | ICD-10-CM | POA: Insufficient documentation

## 2022-07-20 DIAGNOSIS — Z794 Long term (current) use of insulin: Secondary | ICD-10-CM | POA: Insufficient documentation

## 2022-07-20 DIAGNOSIS — Z87891 Personal history of nicotine dependence: Secondary | ICD-10-CM

## 2022-07-20 DIAGNOSIS — Z79899 Other long term (current) drug therapy: Secondary | ICD-10-CM | POA: Diagnosis not present

## 2022-07-20 DIAGNOSIS — E1151 Type 2 diabetes mellitus with diabetic peripheral angiopathy without gangrene: Secondary | ICD-10-CM | POA: Insufficient documentation

## 2022-07-20 HISTORY — PX: AMPUTATION: SHX166

## 2022-07-20 LAB — CBC
HCT: 25 % — ABNORMAL LOW (ref 36.0–46.0)
Hemoglobin: 7.9 g/dL — ABNORMAL LOW (ref 12.0–15.0)
MCH: 27.6 pg (ref 26.0–34.0)
MCHC: 31.6 g/dL (ref 30.0–36.0)
MCV: 87.4 fL (ref 80.0–100.0)
Platelets: 354 10*3/uL (ref 150–400)
RBC: 2.86 MIL/uL — ABNORMAL LOW (ref 3.87–5.11)
RDW: 13.9 % (ref 11.5–15.5)
WBC: 13.9 10*3/uL — ABNORMAL HIGH (ref 4.0–10.5)
nRBC: 0 % (ref 0.0–0.2)

## 2022-07-20 LAB — BASIC METABOLIC PANEL
Anion gap: 12 (ref 5–15)
BUN: 36 mg/dL — ABNORMAL HIGH (ref 6–20)
CO2: 21 mmol/L — ABNORMAL LOW (ref 22–32)
Calcium: 8.2 mg/dL — ABNORMAL LOW (ref 8.9–10.3)
Chloride: 101 mmol/L (ref 98–111)
Creatinine, Ser: 1.65 mg/dL — ABNORMAL HIGH (ref 0.44–1.00)
GFR, Estimated: 36 mL/min — ABNORMAL LOW (ref >60)
Glucose, Bld: 129 mg/dL — ABNORMAL HIGH (ref 70–99)
Potassium: 3.1 mmol/L — ABNORMAL LOW (ref 3.5–5.1)
Sodium: 134 mmol/L — ABNORMAL LOW (ref 135–145)

## 2022-07-20 LAB — GLUCOSE, CAPILLARY
Glucose-Capillary: 115 mg/dL — ABNORMAL HIGH (ref 70–99)
Glucose-Capillary: 124 mg/dL — ABNORMAL HIGH (ref 70–99)
Glucose-Capillary: 115 mg/dL — ABNORMAL HIGH (ref 70–99)

## 2022-07-20 SURGERY — AMPUTATION DIGIT
Anesthesia: General | Site: Foot | Laterality: Bilateral

## 2022-07-20 MED ORDER — ONDANSETRON HCL 4 MG/2ML IJ SOLN
INTRAMUSCULAR | Status: AC
  Filled 2022-07-20: qty 2

## 2022-07-20 MED ORDER — FENTANYL CITRATE (PF) 100 MCG/2ML IJ SOLN
INTRAMUSCULAR | Status: AC
  Filled 2022-07-20: qty 2

## 2022-07-20 MED ORDER — SUCCINYLCHOLINE CHLORIDE 200 MG/10ML IV SOSY
PREFILLED_SYRINGE | INTRAVENOUS | Status: DC | PRN
  Administered 2022-07-20: 140 mg via INTRAVENOUS

## 2022-07-20 MED ORDER — PROPOFOL 10 MG/ML IV BOLUS
INTRAVENOUS | Status: DC | PRN
  Administered 2022-07-20: 200 mg via INTRAVENOUS

## 2022-07-20 MED ORDER — ACETAMINOPHEN 500 MG PO TABS: 1000.0000 mg | ORAL_TABLET | Freq: Once | ORAL | Status: AC

## 2022-07-20 MED ORDER — MIDAZOLAM HCL 2 MG/2ML IJ SOLN
INTRAMUSCULAR | Status: AC
  Filled 2022-07-20: qty 2

## 2022-07-20 MED ORDER — CHLORHEXIDINE GLUCONATE 0.12 % MT SOLN
OROMUCOSAL | Status: AC
  Filled 2022-07-20: qty 15

## 2022-07-20 MED ORDER — SUCCINYLCHOLINE CHLORIDE 200 MG/10ML IV SOSY
PREFILLED_SYRINGE | INTRAVENOUS | Status: AC
  Filled 2022-07-20: qty 10

## 2022-07-20 MED ORDER — OXYCODONE HCL 5 MG PO TABS: 5.0000 mg | ORAL_TABLET | Freq: Once | ORAL | Status: DC | PRN

## 2022-07-20 MED ORDER — ONDANSETRON HCL 4 MG/2ML IJ SOLN: 4.0000 mg | Freq: Once | INTRAMUSCULAR | Status: DC | PRN

## 2022-07-20 MED ORDER — HYDROMORPHONE HCL 1 MG/ML IJ SOLN: 0.2500 mg | INTRAMUSCULAR | Status: DC | PRN

## 2022-07-20 MED ORDER — ONDANSETRON HCL 4 MG/2ML IJ SOLN
INTRAMUSCULAR | Status: DC | PRN
  Administered 2022-07-20: 4 mg via INTRAVENOUS

## 2022-07-20 MED ORDER — PHENYLEPHRINE 80 MCG/ML (10ML) SYRINGE FOR IV PUSH (FOR BLOOD PRESSURE SUPPORT)
PREFILLED_SYRINGE | INTRAVENOUS | Status: AC
  Filled 2022-07-20: qty 10

## 2022-07-20 MED ORDER — PROPOFOL 10 MG/ML IV BOLUS
INTRAVENOUS | Status: AC
  Filled 2022-07-20: qty 20

## 2022-07-20 MED ORDER — FENTANYL CITRATE (PF) 250 MCG/5ML IJ SOLN
INTRAMUSCULAR | Status: DC | PRN
  Administered 2022-07-20: 50 ug via INTRAVENOUS

## 2022-07-20 MED ORDER — FENTANYL CITRATE (PF) 250 MCG/5ML IJ SOLN
INTRAMUSCULAR | Status: AC
  Filled 2022-07-20: qty 5

## 2022-07-20 MED ORDER — OXYCODONE HCL 5 MG/5ML PO SOLN: 5.0000 mg | Freq: Once | ORAL | Status: DC | PRN

## 2022-07-20 MED ORDER — LACTATED RINGERS IV SOLN: INTRAVENOUS | Status: DC

## 2022-07-20 MED ORDER — ORAL CARE MOUTH RINSE: 15.0000 mL | Freq: Once | OROMUCOSAL | Status: AC

## 2022-07-20 MED ORDER — LIDOCAINE 2% (20 MG/ML) 5 ML SYRINGE
INTRAMUSCULAR | Status: AC
  Filled 2022-07-20: qty 5

## 2022-07-20 MED ORDER — OXYCODONE-ACETAMINOPHEN 5-325 MG PO TABS: 1.0000 | ORAL_TABLET | ORAL | 0 refills | Status: DC | PRN

## 2022-07-20 MED ORDER — CHLORHEXIDINE GLUCONATE 0.12 % MT SOLN
15.0000 mL | Freq: Once | OROMUCOSAL | Status: AC
  Administered 2022-07-20: 15 mL via OROMUCOSAL

## 2022-07-20 MED ORDER — CEFAZOLIN SODIUM-DEXTROSE 2-3 GM-%(50ML) IV SOLR
INTRAVENOUS | Status: DC | PRN
  Administered 2022-07-20: 2 g via INTRAVENOUS

## 2022-07-20 MED ORDER — 0.9 % SODIUM CHLORIDE (POUR BTL) OPTIME
TOPICAL | Status: DC | PRN
  Administered 2022-07-20: 1000 mL

## 2022-07-20 MED ORDER — INSULIN ASPART 100 UNIT/ML IJ SOLN: 0.0000 [IU] | INTRAMUSCULAR | Status: DC | PRN

## 2022-07-20 MED ORDER — ACETAMINOPHEN 500 MG PO TABS
ORAL_TABLET | ORAL | Status: AC
  Administered 2022-07-20: 1000 mg via ORAL
  Filled 2022-07-20: qty 2

## 2022-07-20 MED ORDER — LIDOCAINE 2% (20 MG/ML) 5 ML SYRINGE
INTRAMUSCULAR | Status: DC | PRN
  Administered 2022-07-20: 60 mg via INTRAVENOUS

## 2022-07-20 SURGICAL SUPPLY — 36 items
BAG COUNTER SPONGE SURGICOUNT (BAG) ×2 IMPLANT
BAG SPNG CNTER NS LX DISP (BAG)
BLADE LONG MED 31X9 (MISCELLANEOUS) IMPLANT
BLADE SURG 21 STRL SS (BLADE) ×2 IMPLANT
BNDG CMPR 9X4 STRL LF SNTH (GAUZE/BANDAGES/DRESSINGS)
BNDG COHESIVE 4X5 TAN STRL (GAUZE/BANDAGES/DRESSINGS) ×2 IMPLANT
BNDG ESMARK 4X9 LF (GAUZE/BANDAGES/DRESSINGS) IMPLANT
BNDG GAUZE DERMACEA FLUFF 4 (GAUZE/BANDAGES/DRESSINGS) ×2 IMPLANT
BNDG GZE DERMACEA 4 6PLY (GAUZE/BANDAGES/DRESSINGS) ×3
COVER SURGICAL LIGHT HANDLE (MISCELLANEOUS) ×4 IMPLANT
DRAPE U-SHAPE 47X51 STRL (DRAPES) ×2 IMPLANT
DRSG ADAPTIC 3X8 NADH LF (GAUZE/BANDAGES/DRESSINGS) IMPLANT
DURAPREP 26ML APPLICATOR (WOUND CARE) ×2 IMPLANT
ELECT REM PT RETURN 9FT ADLT (ELECTROSURGICAL) ×1
ELECTRODE REM PT RTRN 9FT ADLT (ELECTROSURGICAL) ×2 IMPLANT
GAUZE PAD ABD 8X10 STRL (GAUZE/BANDAGES/DRESSINGS) ×2 IMPLANT
GAUZE SPONGE 4X4 12PLY STRL (GAUZE/BANDAGES/DRESSINGS) IMPLANT
GLOVE BIOGEL PI IND STRL 9 (GLOVE) ×2 IMPLANT
GLOVE SURG ORTHO 9.0 STRL STRW (GLOVE) ×2 IMPLANT
GOWN STRL REUS W/ TWL XL LVL3 (GOWN DISPOSABLE) ×4 IMPLANT
GOWN STRL REUS W/TWL XL LVL3 (GOWN DISPOSABLE) ×2
GRAFT SKIN WND MICRO 38 (Tissue) IMPLANT
KIT BASIN OR (CUSTOM PROCEDURE TRAY) ×2 IMPLANT
KIT TURNOVER KIT B (KITS) ×2 IMPLANT
MANIFOLD NEPTUNE II (INSTRUMENTS) ×2 IMPLANT
NDL 22X1.5 STRL (OR ONLY) (MISCELLANEOUS) IMPLANT
NEEDLE 22X1.5 STRL (OR ONLY) (MISCELLANEOUS) IMPLANT
NS IRRIG 1000ML POUR BTL (IV SOLUTION) ×2 IMPLANT
PACK ORTHO EXTREMITY (CUSTOM PROCEDURE TRAY) ×2 IMPLANT
PAD ABD 8X10 STRL (GAUZE/BANDAGES/DRESSINGS) IMPLANT
PAD ARMBOARD 7.5X6 YLW CONV (MISCELLANEOUS) ×4 IMPLANT
STOCKINETTE 6  STRL (DRAPES) ×1
STOCKINETTE 6 STRL (DRAPES) IMPLANT
SUT ETHILON 2 0 PSLX (SUTURE) ×2 IMPLANT
SYR CONTROL 10ML LL (SYRINGE) IMPLANT
TOWEL GREEN STERILE (TOWEL DISPOSABLE) ×2 IMPLANT

## 2022-07-20 NOTE — Anesthesia Postprocedure Evaluation (Signed)
Anesthesia Post Note  Patient: Kayla Schmitt  Procedure(s) Performed: AMPUTATION RIGHT GREAT TOE AND SECOND TOE, AMPUTATION LEFT GREAT TOE (Bilateral: Foot)     Patient location during evaluation: PACU Anesthesia Type: General Level of consciousness: awake and alert, oriented and patient cooperative Pain management: pain level controlled Vital Signs Assessment: post-procedure vital signs reviewed and stable Respiratory status: spontaneous breathing, nonlabored ventilation and respiratory function stable Cardiovascular status: blood pressure returned to baseline and stable Postop Assessment: no apparent nausea or vomiting Anesthetic complications: no   No notable events documented.  Last Vitals:  Vitals:   07/20/22 1115 07/20/22 1130  BP: (!) 112/55 (!) 105/56  Pulse: 75 73  Resp: 13 13  Temp:  36.6 C  SpO2: 96% 96%    Last Pain:  Vitals:   07/20/22 1130  PainSc: 0-No pain                 Jarome Matin [name]

## 2022-07-20 NOTE — Transfer of Care (Signed)
Immediate Anesthesia Transfer of Care Note  Patient: Kayla Schmitt  Procedure(s) Performed: AMPUTATION RIGHT GREAT TOE AND SECOND TOE, AMPUTATION LEFT GREAT TOE (Bilateral: Foot)  Patient Location: PACU  Anesthesia Type:General  Level of Consciousness: awake, alert , and oriented  Airway & Oxygen Therapy: Patient Spontanous Breathing  Post-op Assessment: Report given to RN and Post -op Vital signs reviewed and stable  Post vital signs: Reviewed and stable  Last Vitals:  Vitals Value Taken Time  BP 105/63 07/20/22 1053  Temp    Pulse 85 07/20/22 1055  Resp 16 07/20/22 1055  SpO2 89 % 07/20/22 1055  Vitals shown include unvalidated device data.  Last Pain:  Vitals:   07/20/22 0759  PainSc: 0-No pain         Complications: No notable events documented.

## 2022-07-20 NOTE — Anesthesia Preprocedure Evaluation (Addendum)
Anesthesia Evaluation  Patient identified by MRN, date of birth, ID band Patient awake    Reviewed: Allergy & Precautions, H&P , NPO status , Patient's Chart, lab work & pertinent test results  History of Anesthesia Complications Negative for: history of anesthetic complications  Airway Mallampati: III  TM Distance: >3 FB Neck ROM: Full    Dental  (+) Edentulous Upper   Pulmonary former smoker Quit smoking 2018, former heavy smoker   Pulmonary exam normal breath sounds clear to auscultation       Cardiovascular hypertension, Pt. on medications + Peripheral Vascular Disease (plavix 2d ago)  Normal cardiovascular exam Rhythm:Regular Rate:Normal     Neuro/Psych negative neurological ROS  negative psych ROS   GI/Hepatic ,GERD  Controlled,,(+)       marijuana useOccasional marijuana    Endo/Other  diabetes, Well Controlled, Type 2, Insulin DependentHypothyroidism    Renal/GU negative Renal ROS  negative genitourinary   Musculoskeletal  (+) Arthritis , Osteoarthritis,    Abdominal  (+) + obese  Peds negative pediatric ROS (+)  Hematology negative hematology ROS (+)   Anesthesia Other Findings Ozempic LD 3/19  Reproductive/Obstetrics negative OB ROS                              Anesthesia Physical Anesthesia Plan  ASA: 3  Anesthesia Plan: General   Post-op Pain Management: Tylenol PO (pre-op)*   Induction: Intravenous and Rapid sequence  PONV Risk Score and Plan: Treatment may vary due to age or medical condition, Ondansetron and Midazolam  Airway Management Planned: Oral ETT  Additional Equipment: None  Intra-op Plan:   Post-operative Plan: Extubation in OR  Informed Consent: I have reviewed the patients History and Physical, chart, labs and discussed the procedure including the risks, benefits and alternatives for the proposed anesthesia with the patient or authorized  representative who has indicated his/her understanding and acceptance.     Dental advisory given  Plan Discussed with: CRNA  Anesthesia Plan Comments: (Not off ozempic for appropriate amount of time- will RSI/ETT)         Anesthesia Quick Evaluation

## 2022-07-20 NOTE — H&P (Signed)
Tracia Holle Shover is an 57 y.o. female.   Chief Complaint: Gangrenous ulceration great toe bilateral feet HPI: Patient is a 57 year old woman with gangrenous changes to the right great toe and right second toe and left great toe.  Patient has undergone endovascular revascularization and still has persistent painful wet gangrenous changes to the forefoot.  Past medical history consistent for diabetes hypertension and peripheral arterial disease.  Past Medical History:  Diagnosis Date   Arthritis    Diabetes mellitus without complication (HCC)    type 2   GERD (gastroesophageal reflux disease)    Hypercholesteremia    Hypertension    Hypothyroidism    PAD (peripheral artery disease) (HCC)    PONV (postoperative nausea and vomiting)    Thyroid disease     Past Surgical History:  Procedure Laterality Date   ABDOMINAL AORTOGRAM W/LOWER EXTREMITY Bilateral 01/14/2020   Procedure: ABDOMINAL AORTOGRAM W/LOWER EXTREMITY;  Surgeon: Marty Heck, MD;  Location: [facility];  Service: Cardiovascular;  Laterality: Bilateral;   ABDOMINAL AORTOGRAM W/LOWER EXTREMITY N/A 07/05/2022   Procedure: ABDOMINAL AORTOGRAM W/LOWER EXTREMITY;  Surgeon: Marty Heck, MD;  Location: [facility];  Service: Cardiovascular;  Laterality: N/A;   AMPUTATION TOE     PERIPHERAL VASCULAR ATHERECTOMY Right 01/14/2020   Procedure: PERIPHERAL VASCULAR ATHERECTOMY;  Surgeon: Marty Heck, MD;  Location: [facility];  Service: Cardiovascular;  Laterality: Right;  sfa   PERIPHERAL VASCULAR INTERVENTION Right 01/14/2020   Procedure: PERIPHERAL VASCULAR INTERVENTION;  Surgeon: Marty Heck, MD;  Location: [facility];  Service: Cardiovascular;  Laterality: Right;  sfa stent    PERIPHERAL VASCULAR INTERVENTION  07/05/2022   Procedure: PERIPHERAL VASCULAR INTERVENTION;  Surgeon: Marty Heck, MD;  Location: [facility];  Service: Cardiovascular;;   SKIN SPLIT  GRAFT Right 03/18/2020   Procedure: SKIN GRAFTING RIGHT FOOT ULCER;  Surgeon: Newt Minion, MD;  Location: [facility];  Service: Orthopedics;  Laterality: Right;   WISDOM TOOTH EXTRACTION      Family History  Problem Relation Age of Onset   Diabetes Other    CAD Other    Social History:  reports that she quit smoking about 5 years ago. Her smoking use included cigarettes. She has never used smokeless tobacco. She reports that she does not currently use alcohol. She reports current drug use. Drug: Marijuana.  Allergies:  Allergies  Allergen Reactions   Penicillins Rash    Did it involve swelling of the face/tongue/throat, SOB, or low BP? no Did it involve sudden or severe rash/hives, skin peeling, or any reaction on the inside of your mouth or nose? yes Did you need to seek medical attention at a hospital or doctor's office? yes When did it last happen?      childhood allergy If all above answers are "NO", may proceed with cephalosporin use.    Doxycycline Nausea And Vomiting    Extremely N/V   Trental [Pentoxifylline] Nausea And Vomiting    No medications prior to admission.    No results found for this or any previous visit (from the past 48 hour(s)). No results found.  Review of Systems  All other systems reviewed and are negative.   Height 5\' 4"  (1.626 m), weight 93 kg. Physical Exam  Patient is alert, oriented, no adenopathy, well-dressed, normal affect, normal respiratory effort. 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  There is no  cellulitis proximal to the MTP joint.  Examination of the left foot she has gangrenous changes of the great toe with no cellulitis proximal to the MTP joint. Assessment/Plan Assessment: Gangrene right great toe and right second toe and left great toe.  Plan: With failure of surgical intervention patient has persistent pain cellulitis and wet gangrenous changes.  Patient wishes to proceed  with amputation surgery bilaterally.  Will plan for amputation of the left great toe and right great toe and right second toe.  Risks and benefits were discussed including risk of the wound not healing need for additional surgery.  Patient states she understands wished to proceed at this time.  Newt Minion, MD 07/20/2022, 6:35 AM

## 2022-07-20 NOTE — Progress Notes (Signed)
Orthopedic Tech Progress Note Patient Details:  Kayla Schmitt 17-May-1965 [id number]  Ortho Devices Type of Ortho Device: Postop shoe/boot Ortho Device/Splint Location: BLE Ortho Device/Splint Interventions: Ordered      Danton Sewer A Devota Viruet 07/20/2022, 11:31 AM

## 2022-07-20 NOTE — Op Note (Signed)
07/20/2022  10:47 AM  PATIENT:  Kayla Schmitt    PRE-OPERATIVE DIAGNOSIS:  Gangrene Right and Left Foot  POST-OPERATIVE DIAGNOSIS:  Same  PROCEDURE: Ray AMPUTATION RIGHT GREAT TOE AND SECOND TOE, AMPUTATION LEFT GREAT TOE through the MTP joint. Application Kerecis micro graft 38 cm split equally between both feet.  SURGEON:  Newt Minion, MD  PHYSICIAN ASSISTANT:None ANESTHESIA:   General  PREOPERATIVE INDICATIONS:  Kayla Schmitt is a  57 y.o. female with a diagnosis of Gangrene Right and Left Foot who failed conservative measures and elected for surgical management.    The risks benefits and alternatives were discussed with the patient preoperatively including but not limited to the risks of infection, bleeding, nerve injury, cardiopulmonary complications, the need for revision surgery, among others, and the patient was willing to proceed.  OPERATIVE IMPLANTS:   Implant Name Type Inv. Item Serial No. Manufacturer Lot No. LRB No. Used Action  GRAFT SKIN WND MICRO 38 [id number] Tissue GRAFT SKIN WND MICRO 38  [employer] [id number] Bilateral 1 Implanted    @ENCIMAGES @  OPERATIVE FINDINGS: The right great toe margins were clear.  Margins on the left great toe and second toe had purulent abscess proximal to the MTP joint.  This required amputation of the first and second ray left foot to have clear margins.  OPERATIVE PROCEDURE: Patient was brought the operating room and underwent a general anesthetic.  After adequate levels anesthesia were obtained the bilateral lower extremities were prepped using DuraPrep draped into a sterile field a timeout was called.  A fishmouth incision was just made distal to the MTP joint of the right great toe and the toe was amputated through the MTP joint.  The margins were clear electrocautery was used hemostasis the wound was irrigated normal saline the wound was filled with half of the Kerecis micro graft 38 cm incision closed  using 2-0 nylon.  Attention was then focused on the left great toe and second toe.  Incision was made to amputate the second toe and great toe through the MTP joint.  There is a purulent abscess with necrotic tissue proximal to the MTP joint and the margins were brought more proximally to have clear tissue margins this required a first and second ray amputation.  Electrocautery was used for hemostasis the wound was irrigated normal saline the margins were clear.  The remainder of the Kerecis micro graft 38 cm was applied to the wound bed and the incision closed using 2-0 nylon a sterile dressing was applied patient was extubated taken to PACU in stable condition.   DISCHARGE PLANNING:  Antibiotic duration: Preoperative antibiotics  Weightbearing: Minimize weightbearing both lower extremities  Pain medication: Prescription for Percocet  Dressing care/ Wound VAC: Dry dressing reinforce as needed  Ambulatory devices: Patient has a walker  Discharge to: Home.  Follow-up: In the office 1 week post operative.

## 2022-07-20 NOTE — Anesthesia Procedure Notes (Signed)
Procedure Name: Intubation Date/Time: 07/20/2022 10:12 AM  Performed by: Babs Bertin, CRNAPre-anesthesia Checklist: Patient identified, Emergency Drugs available, Suction available and Patient being monitored Patient Re-evaluated:Patient Re-evaluated prior to induction Oxygen Delivery Method: Circle System Utilized Preoxygenation: Pre-oxygenation with 100% oxygen Induction Type: IV induction and Rapid sequence Laryngoscope Size: Mac and 3 Grade View: Grade I Tube type: Oral Tube size: 7.0 mm Number of attempts: 1 Airway Equipment and Method: Stylet and Oral airway Placement Confirmation: ETT inserted through vocal cords under direct vision, positive ETCO2 and breath sounds checked- equal and bilateral Secured at: 21 cm Tube secured with: Tape Dental Injury: Teeth and Oropharynx as per pre-operative assessment

## 2022-07-21 ENCOUNTER — Encounter (HOSPITAL_COMMUNITY): Payer: Self-pay | Admitting: Orthopedic Surgery

## 2022-07-26 ENCOUNTER — Telehealth: Payer: Self-pay | Admitting: Orthopedic Surgery

## 2022-07-26 NOTE — Telephone Encounter (Signed)
Patient states  she has a bad odor coming from her incision. Please advise

## 2022-07-26 NOTE — Telephone Encounter (Signed)
Called pt back, went straight to VM. LMTCB

## 2022-07-30 ENCOUNTER — Telehealth: Payer: Self-pay

## 2022-07-30 NOTE — Telephone Encounter (Signed)
Tried to call pt today she does have an appt sch tomorrow with Dr. Sharol Given for eval.

## 2022-07-30 NOTE — Telephone Encounter (Signed)
Patient called. She is having a "foul odor" from her left foot. She had bilateral great toe amputations on 07/20/2022. She said that the right foot is doing great! No other symptoms to report from the left other than the odor. She removed and changed the bandage herself, but the odor is still there. [phone], if you can't reach her there, call [phone]

## 2022-07-30 NOTE — Telephone Encounter (Signed)
I called the pt and it went straight to VM. Will hold and try again. She does have an appt sch for tomorrow at 1:15 with Dr. Sharol Given.

## 2022-07-30 NOTE — Telephone Encounter (Signed)
I called and sw pt and she advised that she has changed the dressing and there is less drainage now ( hardley at all) there is slight redness to the incision but no increased pain, no temp and no other s/s of infection. The pt did confirm that she will keep her appt as sch tomorrow and will continue with dry dressing and non weight bearing. Will see in office tomorrow.

## 2022-07-30 NOTE — Telephone Encounter (Signed)
I called and sw pt. See other message,

## 2022-07-31 ENCOUNTER — Ambulatory Visit (INDEPENDENT_AMBULATORY_CARE_PROVIDER_SITE_OTHER): Payer: Managed Care, Other (non HMO) | Admitting: Orthopedic Surgery

## 2022-07-31 ENCOUNTER — Ambulatory Visit (INDEPENDENT_AMBULATORY_CARE_PROVIDER_SITE_OTHER): Payer: Managed Care, Other (non HMO) | Admitting: Physician Assistant

## 2022-07-31 ENCOUNTER — Ambulatory Visit (INDEPENDENT_AMBULATORY_CARE_PROVIDER_SITE_OTHER)
Admission: RE | Admit: 2022-07-31 | Discharge: 2022-07-31 | Disposition: A | Discharge status: Home / Self Care | Payer: Managed Care, Other (non HMO) | Source: Ambulatory Visit | Attending: Physician Assistant | Admitting: Physician Assistant

## 2022-07-31 ENCOUNTER — Other Ambulatory Visit: Payer: Self-pay

## 2022-07-31 ENCOUNTER — Other Ambulatory Visit: Payer: Self-pay | Admitting: *Deleted

## 2022-07-31 ENCOUNTER — Telehealth: Payer: Self-pay

## 2022-07-31 ENCOUNTER — Ambulatory Visit (INDEPENDENT_AMBULATORY_CARE_PROVIDER_SITE_OTHER)
Admission: RE | Admit: 2022-07-31 | Discharge: 2022-07-31 | Disposition: A | Discharge status: Home / Self Care | Payer: Managed Care, Other (non HMO) | Source: Ambulatory Visit | Attending: Vascular Surgery

## 2022-07-31 VITALS — BP 111/61 | HR 86 | Temp 98.7°F | Resp 20 | Ht 64.0 in | Wt 199.0 lb

## 2022-07-31 DIAGNOSIS — I70223 Atherosclerosis of native arteries of extremities with rest pain, bilateral legs: Secondary | ICD-10-CM

## 2022-07-31 DIAGNOSIS — T8189XA Other complications of procedures, not elsewhere classified, initial encounter: Secondary | ICD-10-CM

## 2022-07-31 DIAGNOSIS — I739 Peripheral vascular disease, unspecified: Secondary | ICD-10-CM

## 2022-07-31 DIAGNOSIS — I96 Gangrene, not elsewhere classified: Secondary | ICD-10-CM

## 2022-07-31 LAB — VAS US ABI WITH/WO TBI: Left ABI: 0.41

## 2022-07-31 MED ORDER — SULFAMETHOXAZOLE-TRIMETHOPRIM 800-160 MG PO TABS
1.0000 | ORAL_TABLET | Freq: Two times a day (BID) | ORAL | 0 refills | Status: DC
Start: 2022-07-31 — End: 2022-08-14

## 2022-07-31 NOTE — Progress Notes (Signed)
Office Note     CC:  follow up Requesting Provider:  [facility]  HPI: Kayla Schmitt is a 57 y.o. (16-Feb-1966) female who was worked in urgently to office schedule today for evaluation of nonhealing toe amputations of bilateral lower extremities.  Toe amputations were performed by Dr. Sharol Given on 07/20/2022.  She underwent left SFA stenting and above-the-knee popliteal balloon angioplasty by Dr. Carlis Abbott on 07/05/2022.  She also has history of right SFA stenting by Dr. Carlis Abbott on 01/14/2020.  She is on her aspirin and Plavix daily.  She was started on Bactrim by Dr. Sharol Given today.  She denies fevers, chills, nausea/vomiting.  Past medical history also significant for insulin-dependent diabetes mellitus.   Past Medical History:  Diagnosis Date   Arthritis    Diabetes mellitus without complication    type 2   GERD (gastroesophageal reflux disease)    Hypercholesteremia    Hypertension    Hypothyroidism    PAD (peripheral artery disease)    PONV (postoperative nausea and vomiting)    Thyroid disease     Past Surgical History:  Procedure Laterality Date   ABDOMINAL AORTOGRAM W/LOWER EXTREMITY Bilateral 01/14/2020   Procedure: ABDOMINAL AORTOGRAM W/LOWER EXTREMITY;  Surgeon: Marty Heck, MD;  Location: [facility];  Service: Cardiovascular;  Laterality: Bilateral;   ABDOMINAL AORTOGRAM W/LOWER EXTREMITY N/A 07/05/2022   Procedure: ABDOMINAL AORTOGRAM W/LOWER EXTREMITY;  Surgeon: Marty Heck, MD;  Location: [facility];  Service: Cardiovascular;  Laterality: N/A;   AMPUTATION Bilateral 07/20/2022   Procedure: AMPUTATION RIGHT GREAT TOE AND SECOND TOE, AMPUTATION LEFT GREAT TOE;  Surgeon: Newt Minion, MD;  Location: [facility];  Service: Orthopedics;  Laterality: Bilateral;   AMPUTATION TOE     PERIPHERAL VASCULAR ATHERECTOMY Right 01/14/2020   Procedure: PERIPHERAL VASCULAR ATHERECTOMY;  Surgeon: Marty Heck, MD;  Location: [facility];   Service: Cardiovascular;  Laterality: Right;  sfa   PERIPHERAL VASCULAR INTERVENTION Right 01/14/2020   Procedure: PERIPHERAL VASCULAR INTERVENTION;  Surgeon: Marty Heck, MD;  Location: [facility];  Service: Cardiovascular;  Laterality: Right;  sfa stent    PERIPHERAL VASCULAR INTERVENTION  07/05/2022   Procedure: PERIPHERAL VASCULAR INTERVENTION;  Surgeon: Marty Heck, MD;  Location: [facility];  Service: Cardiovascular;;   SKIN SPLIT GRAFT Right 03/18/2020   Procedure: SKIN GRAFTING RIGHT FOOT ULCER;  Surgeon: Newt Minion, MD;  Location: [facility];  Service: Orthopedics;  Laterality: Right;   WISDOM TOOTH EXTRACTION      Social History   Socioeconomic History   Marital status: Divorced    Spouse name: Not on file   Number of children: Not on file   Years of education: Not on file   Highest education level: Not on file  Occupational History   Not on file  Tobacco Use   Smoking status: Former    Types: Cigarettes    Quit date: 01/2017    Years since quitting: 5.5   Smokeless tobacco: Never  Vaping Use   Vaping Use: Never used  Substance and Sexual Activity   Alcohol use: Not Currently   Drug use: Yes    Types: Marijuana    Comment: last use 03/12/20 - uses twice a month per pt 03/16/20   Sexual activity: Yes    Birth control/protection: Post-menopausal  Other Topics Concern   Not on file  Social History Narrative   Not on file   Social Determinants of Health  Financial Resource Strain: Not on file  Food Insecurity: Not on file  Transportation Needs: Not on file  Physical Activity: Not on file  Stress: Not on file  Social Connections: Not on file  Intimate Partner Violence: Not on file    Family History  Problem Relation Age of Onset   Diabetes Other    CAD Other     Current Outpatient Medications  Medication Sig Dispense Refill   acetaminophen (TYLENOL) 500 MG tablet Take 1,000 mg by mouth every 6 (six) hours as needed for  moderate pain.     amLODipine (NORVASC) 10 MG tablet Take 10 mg by mouth daily.     aspirin 81 MG EC tablet Take 1 tablet by mouth 2 (two) times a week.     atorvastatin (LIPITOR) 40 MG tablet Take 40 mg by mouth daily.     clindamycin (CLEOCIN) 300 MG capsule Take 300 mg by mouth 3 (three) times daily.     clopidogrel (PLAVIX) 75 MG tablet TAKE 1 TABLET BY MOUTH EVERY DAY WITH BREAKFAST 90 tablet 3   collagenase (SANTYL) ointment Apply topically daily. 15 g 2   diphenhydrAMINE HCl, Sleep, (ZZZQUIL) 25 MG CAPS Take 25 mg by mouth at bedtime.     famotidine (PEPCID) 20 MG tablet Take 20 mg by mouth daily.     gabapentin (NEURONTIN) 300 MG capsule Take 300 mg by mouth daily.     ibuprofen (ADVIL) 600 MG tablet Take 1 tablet (600 mg total) by mouth every 6 (six) hours as needed. 30 tablet 0   insulin degludec (TRESIBA FLEXTOUCH) 100 UNIT/ML FlexTouch Pen Inject 10 Units into the skin at bedtime. (Patient taking differently: Inject 16-18 Units into the skin at bedtime.) 3 mL 3   levofloxacin (LEVAQUIN) 500 MG tablet Take 500 mg by mouth daily.     levothyroxine (SYNTHROID) 150 MCG tablet Take 150 mcg by mouth daily.     lisinopril-hydrochlorothiazide (ZESTORETIC) 20-25 MG tablet Take 2 tablets by mouth daily.      mupirocin ointment (BACTROBAN) 2 % Apply topically 2 (two) times daily.     nitroGLYCERIN (NITRODUR - DOSED IN MG/24 HR) 0.2 mg/hr patch Place 1 patch (0.2 mg total) onto the skin daily. 30 patch 12   nitroGLYCERIN (NITRODUR - DOSED IN MG/24 HR) 0.2 mg/hr patch Place 1 patch (0.2 mg total) onto the skin daily. 30 patch 12   oxyCODONE-acetaminophen (PERCOCET/ROXICET) 5-325 MG tablet Take 1 tablet by mouth every 4 (four) hours as needed. 30 tablet 0   pantoprazole (PROTONIX) 40 MG tablet Take 40 mg by mouth daily.     pentoxifylline (TRENTAL) 400 MG CR tablet Take 1 tablet (400 mg total) by mouth 3 (three) times daily with meals. 90 tablet 3   Semaglutide, 1 MG/DOSE, (OZEMPIC, 1 MG/DOSE,) 2  MG/1.5ML SOPN Inject 1 mg into the skin every Saturday.     sulfamethoxazole-trimethoprim (BACTRIM DS) 800-160 MG tablet Take 1 tablet by mouth 2 (two) times daily. 40 tablet 0   No current facility-administered medications for this visit.    Allergies  Allergen Reactions   Penicillins Rash    Did it involve swelling of the face/tongue/throat, SOB, or low BP? no Did it involve sudden or severe rash/hives, skin peeling, or any reaction on the inside of your mouth or nose? yes Did you need to seek medical attention at a hospital or doctor's office? yes When did it last happen?      childhood allergy If all above answers are "  NO", may proceed with cephalosporin use.    Doxycycline Nausea And Vomiting    Extremely N/V   Trental [Pentoxifylline] Nausea And Vomiting     REVIEW OF SYSTEMS:   [X]  denotes positive finding, [ ]  denotes negative finding Cardiac  Comments:  Chest pain or chest pressure:    Shortness of breath upon exertion:    Short of breath when lying flat:    Irregular heart rhythm:        Vascular    Pain in calf, thigh, or hip brought on by ambulation:    Pain in feet at night that wakes you up from your sleep:     Blood clot in your veins:    Leg swelling:         Pulmonary    Oxygen at home:    Productive cough:     Wheezing:         Neurologic    Sudden weakness in arms or legs:     Sudden numbness in arms or legs:     Sudden onset of difficulty speaking or slurred speech:    Temporary loss of vision in one eye:     Problems with dizziness:         Gastrointestinal    Blood in stool:     Vomited blood:         Genitourinary    Burning when urinating:     Blood in urine:        Psychiatric    Major depression:         Hematologic    Bleeding problems:    Problems with blood clotting too easily:        Skin    Rashes or ulcers:        Constitutional    Fever or chills:      PHYSICAL EXAMINATION:  Vitals:   07/31/22 1503  BP: 111/61   Pulse: 86  Resp: 20  Temp: 98.7 F (37.1 C)  TempSrc: Temporal  SpO2: 100%  Weight: 199 lb (90.3 kg)  Height: 5\' 4"  (1.626 m)    General:  WDWN in NAD; vital signs documented above Gait: Not observed HENT: WNL, normocephalic Pulmonary: normal non-labored breathing , without Rales, rhonchi,  wheezing Cardiac: regular HR Abdomen: soft, NT, no masses Skin: without rashes Vascular Exam/Pulses: Palpable right femoral pulse; brisk DP signals bilaterally by Doppler; brisk right PT signal by Doppler, soft left PT signal by Doppler Extremities: Nonhealing toe amputation sites of both feet pictured below Musculoskeletal: no muscle wasting or atrophy  Neurologic: A&O X 3 Psychiatric:  The pt has Normal affect.        Non-Invasive Vascular Imaging:   Distal left SFA stent occluded with low flow velocity in the below the knee popliteal  Noncompressible right tibials Left ABI 0.4    ASSESSMENT/PLAN:: 57 y.o. female here for evaluation of nonhealing bilateral lower extremity toe amputations with history of right SFA stent as well as recent left SFA stenting  -Duplex demonstrates an occluded left distal SFA stent with a left ABI of 0.4.  Right lower extremity arterial duplex demonstrates a patent right SFA stent from February of this year.  At any rate she has nonhealing surgical wounds of both feet.  She was evaluated by Dr. Sharol Given today who started her on Bactrim.  She will need repeat aortogram with bilateral lower extremity runoff.  Priority will be given to the left lower extremity to revascularize the occluded left SFA  however Dr. Carlis Abbott will also try to perform a diagnostic angiogram of the right lower extremity.  This will be performed on Thursday, 08/02/2022.  Patient will likely be admitted postoperatively for potential bilateral lower extremity wound debridement by Dr. Sharol Given of the following day.  Dr. Carlis Abbott discussed with the patient today that she is at risk for amputation of both  legs.  She will stay on her aspirin and Plavix perioperatively.  Case was discussed with the patient as well as her mother and they agree to proceed on Thursday in the Cath Lab with Dr. Carlis Abbott.   Dagoberto Ligas, PA-C [facility] [phone]  Clinic MD:   Carlis Abbott

## 2022-07-31 NOTE — Telephone Encounter (Signed)
Caller: Autumn @ Dr. Jess Barters office  Concern: Non-healing BL toe amputations (pics in media)  Procedure:  Aortogram on 07/05/22 with Dr. Carlis Abbott  Consulted: Benita Stabile, PA, Dana[facility]  Resolution: Appointment scheduled ASAP for ABI & PA  Next Appt: Appointment scheduled for today, 07/31/22 @ 1400

## 2022-08-02 ENCOUNTER — Encounter (HOSPITAL_COMMUNITY)
Admission: AD | Disposition: A | Discharge status: Home / Self Care | Payer: Self-pay | Source: Home / Self Care | Attending: Vascular Surgery

## 2022-08-02 ENCOUNTER — Inpatient Hospital Stay (HOSPITAL_COMMUNITY)
Admission: AD | Admit: 2022-08-02 | Discharge: 2022-08-14 | DRG: 272 | Disposition: A | Discharge status: Home Health | Payer: Managed Care, Other (non HMO) | Attending: Vascular Surgery | Admitting: Vascular Surgery

## 2022-08-02 DIAGNOSIS — E1152 Type 2 diabetes mellitus with diabetic peripheral angiopathy with gangrene: Principal | ICD-10-CM | POA: Diagnosis present

## 2022-08-02 DIAGNOSIS — Z89411 Acquired absence of right great toe: Secondary | ICD-10-CM | POA: Diagnosis not present

## 2022-08-02 DIAGNOSIS — Z89412 Acquired absence of left great toe: Secondary | ICD-10-CM

## 2022-08-02 DIAGNOSIS — T8781 Dehiscence of amputation stump: Secondary | ICD-10-CM

## 2022-08-02 DIAGNOSIS — Z751 Person awaiting admission to adequate facility elsewhere: Secondary | ICD-10-CM | POA: Diagnosis not present

## 2022-08-02 DIAGNOSIS — T82858A Stenosis of vascular prosthetic devices, implants and grafts, initial encounter: Secondary | ICD-10-CM | POA: Diagnosis not present

## 2022-08-02 DIAGNOSIS — Z880 Allergy status to penicillin: Secondary | ICD-10-CM

## 2022-08-02 DIAGNOSIS — Z79899 Other long term (current) drug therapy: Secondary | ICD-10-CM | POA: Diagnosis not present

## 2022-08-02 DIAGNOSIS — Z8249 Family history of ischemic heart disease and other diseases of the circulatory system: Secondary | ICD-10-CM | POA: Diagnosis not present

## 2022-08-02 DIAGNOSIS — E079 Disorder of thyroid, unspecified: Secondary | ICD-10-CM | POA: Diagnosis present

## 2022-08-02 DIAGNOSIS — Z87891 Personal history of nicotine dependence: Secondary | ICD-10-CM

## 2022-08-02 DIAGNOSIS — Z794 Long term (current) use of insulin: Secondary | ICD-10-CM

## 2022-08-02 DIAGNOSIS — Y839 Surgical procedure, unspecified as the cause of abnormal reaction of the patient, or of later complication, without mention of misadventure at the time of the procedure: Secondary | ICD-10-CM | POA: Diagnosis present

## 2022-08-02 DIAGNOSIS — Z833 Family history of diabetes mellitus: Secondary | ICD-10-CM

## 2022-08-02 DIAGNOSIS — E039 Hypothyroidism, unspecified: Secondary | ICD-10-CM | POA: Diagnosis present

## 2022-08-02 DIAGNOSIS — Z888 Allergy status to other drugs, medicaments and biological substances status: Secondary | ICD-10-CM | POA: Diagnosis not present

## 2022-08-02 DIAGNOSIS — E7800 Pure hypercholesterolemia, unspecified: Secondary | ICD-10-CM | POA: Diagnosis present

## 2022-08-02 DIAGNOSIS — I10 Essential (primary) hypertension: Secondary | ICD-10-CM | POA: Diagnosis present

## 2022-08-02 DIAGNOSIS — Z79890 Hormone replacement therapy (postmenopausal): Secondary | ICD-10-CM

## 2022-08-02 DIAGNOSIS — T8189XA Other complications of procedures, not elsewhere classified, initial encounter: Secondary | ICD-10-CM | POA: Diagnosis present

## 2022-08-02 DIAGNOSIS — K219 Gastro-esophageal reflux disease without esophagitis: Secondary | ICD-10-CM | POA: Diagnosis present

## 2022-08-02 DIAGNOSIS — Z7982 Long term (current) use of aspirin: Secondary | ICD-10-CM | POA: Diagnosis not present

## 2022-08-02 DIAGNOSIS — I70223 Atherosclerosis of native arteries of extremities with rest pain, bilateral legs: Secondary | ICD-10-CM | POA: Diagnosis present

## 2022-08-02 DIAGNOSIS — Z7902 Long term (current) use of antithrombotics/antiplatelets: Secondary | ICD-10-CM | POA: Diagnosis not present

## 2022-08-02 HISTORY — PX: PERIPHERAL VASCULAR THROMBECTOMY: CATH118306

## 2022-08-02 HISTORY — PX: PERIPHERAL VASCULAR INTERVENTION: CATH118257

## 2022-08-02 HISTORY — PX: ABDOMINAL AORTOGRAM W/LOWER EXTREMITY: CATH118223

## 2022-08-02 LAB — POCT I-STAT, CHEM 8
BUN: 16 mg/dL (ref 6–20)
BUN: 20 mg/dL (ref 6–20)
Calcium, Ion: 0.97 mmol/L — ABNORMAL LOW (ref 1.15–1.40)
Calcium, Ion: 1.15 mmol/L (ref 1.15–1.40)
Chloride: 105 mmol/L (ref 98–111)
Chloride: 98 mmol/L (ref 98–111)
Creatinine, Ser: 0.7 mg/dL (ref 0.44–1.00)
Creatinine, Ser: 0.7 mg/dL (ref 0.44–1.00)
Glucose, Bld: 172 mg/dL — ABNORMAL HIGH (ref 70–99)
Glucose, Bld: 224 mg/dL — ABNORMAL HIGH (ref 70–99)
HCT: 15 % — ABNORMAL LOW (ref 36.0–46.0)
HCT: 23 % — ABNORMAL LOW (ref 36.0–46.0)
Hemoglobin: 5.1 g/dL — CL (ref 12.0–15.0)
Hemoglobin: 7.8 g/dL — ABNORMAL LOW (ref 12.0–15.0)
Potassium: 2.5 mmol/L — CL (ref 3.5–5.1)
Potassium: 4.2 mmol/L (ref 3.5–5.1)
Sodium: 138 mmol/L (ref 135–145)
Sodium: 132 mmol/L — ABNORMAL LOW (ref 135–145)
TCO2: 18 mmol/L — ABNORMAL LOW (ref 22–32)
TCO2: 22 mmol/L (ref 22–32)

## 2022-08-02 LAB — CBC
HCT: 22.9 % — ABNORMAL LOW (ref 36.0–46.0)
Hemoglobin: 7.4 g/dL — ABNORMAL LOW (ref 12.0–15.0)
MCH: 26.5 pg (ref 26.0–34.0)
MCHC: 32.3 g/dL (ref 30.0–36.0)
MCV: 82.1 fL (ref 80.0–100.0)
Platelets: 365 10*3/uL (ref 150–400)
RBC: 2.79 MIL/uL — ABNORMAL LOW (ref 3.87–5.11)
RDW: 13.9 % (ref 11.5–15.5)
WBC: 11.9 10*3/uL — ABNORMAL HIGH (ref 4.0–10.5)
nRBC: 0 % (ref 0.0–0.2)

## 2022-08-02 LAB — COMPREHENSIVE METABOLIC PANEL
ALT: 24 U/L (ref 0–44)
AST: 23 U/L (ref 15–41)
Albumin: 2.2 g/dL — ABNORMAL LOW (ref 3.5–5.0)
Alkaline Phosphatase: 341 U/L — ABNORMAL HIGH (ref 38–126)
Anion gap: 14 (ref 5–15)
BUN: 23 mg/dL — ABNORMAL HIGH (ref 6–20)
CO2: 23 mmol/L (ref 22–32)
Calcium: 8.8 mg/dL — ABNORMAL LOW (ref 8.9–10.3)
Chloride: 95 mmol/L — ABNORMAL LOW (ref 98–111)
Creatinine, Ser: 1.03 mg/dL — ABNORMAL HIGH (ref 0.44–1.00)
GFR, Estimated: >60 mL/min (ref >60)
Glucose, Bld: 219 mg/dL — ABNORMAL HIGH (ref 70–99)
Potassium: 3.2 mmol/L — ABNORMAL LOW (ref 3.5–5.1)
Sodium: 132 mmol/L — ABNORMAL LOW (ref 135–145)
Total Bilirubin: 0.8 mg/dL (ref 0.3–1.2)
Total Protein: 7.5 g/dL (ref 6.5–8.1)

## 2022-08-02 LAB — GLUCOSE, CAPILLARY
Glucose-Capillary: 218 mg/dL — ABNORMAL HIGH (ref 70–99)
Glucose-Capillary: 201 mg/dL — ABNORMAL HIGH (ref 70–99)

## 2022-08-02 LAB — POCT ACTIVATED CLOTTING TIME: Activated Clotting Time: 212 seconds

## 2022-08-02 LAB — ABO/RH: ABO/RH(D): O POS

## 2022-08-02 SURGERY — ABDOMINAL AORTOGRAM W/LOWER EXTREMITY
Anesthesia: LOCAL

## 2022-08-02 MED ORDER — SODIUM CHLORIDE 0.9 % IV SOLN: INTRAVENOUS | Status: DC

## 2022-08-02 MED ORDER — SODIUM CHLORIDE 0.9 % IV SOLN
INTRAVENOUS | Status: AC | PRN
  Administered 2022-08-02: 10 mL/h via INTRAVENOUS

## 2022-08-02 MED ORDER — MIDAZOLAM HCL 2 MG/2ML IJ SOLN
INTRAMUSCULAR | Status: DC | PRN
  Administered 2022-08-02: 1 mg via INTRAVENOUS

## 2022-08-02 MED ORDER — ASPIRIN 81 MG PO TBEC
81.0000 mg | DELAYED_RELEASE_TABLET | ORAL | Status: DC
  Administered 2022-08-06 – 2022-08-13 (×3): 81 mg via ORAL
  Filled 2022-08-02 (×3): qty 1

## 2022-08-02 MED ORDER — SODIUM CHLORIDE 0.9 % IV SOLN
12.5000 mg | Freq: Once | INTRAVENOUS | Status: AC
  Administered 2022-08-02: 12.5 mg via INTRAVENOUS
  Filled 2022-08-02: qty 0.5

## 2022-08-02 MED ORDER — LIDOCAINE HCL (PF) 1 % IJ SOLN
INTRAMUSCULAR | Status: AC
  Filled 2022-08-02: qty 30

## 2022-08-02 MED ORDER — LEVOTHYROXINE SODIUM 75 MCG PO TABS
150.0000 ug | ORAL_TABLET | Freq: Every day | ORAL | Status: DC
  Administered 2022-08-03 – 2022-08-14 (×12): 150 ug via ORAL
  Filled 2022-08-02 (×12): qty 2

## 2022-08-02 MED ORDER — SODIUM CHLORIDE 0.9% FLUSH: 3.0000 mL | INTRAVENOUS | Status: DC | PRN

## 2022-08-02 MED ORDER — HEPARIN SODIUM (PORCINE) 1000 UNIT/ML IJ SOLN
INTRAMUSCULAR | Status: DC | PRN
  Administered 2022-08-02: 9000 [IU] via INTRAVENOUS

## 2022-08-02 MED ORDER — HEPARIN (PORCINE) IN NACL 1000-0.9 UT/500ML-% IV SOLN
INTRAVENOUS | Status: DC | PRN
  Administered 2022-08-02 (×2): 500 mL

## 2022-08-02 MED ORDER — FENTANYL CITRATE (PF) 100 MCG/2ML IJ SOLN
INTRAMUSCULAR | Status: DC | PRN
  Administered 2022-08-02: 25 ug via INTRAVENOUS

## 2022-08-02 MED ORDER — ACETAMINOPHEN 325 MG PO TABS: 650.0000 mg | ORAL_TABLET | ORAL | Status: DC | PRN

## 2022-08-02 MED ORDER — OXYCODONE-ACETAMINOPHEN 5-325 MG PO TABS
1.0000 | ORAL_TABLET | ORAL | Status: DC | PRN
  Filled 2022-08-02: qty 1

## 2022-08-02 MED ORDER — MIDAZOLAM HCL 2 MG/2ML IJ SOLN
INTRAMUSCULAR | Status: AC
  Filled 2022-08-02: qty 2

## 2022-08-02 MED ORDER — ATORVASTATIN CALCIUM 40 MG PO TABS
40.0000 mg | ORAL_TABLET | Freq: Every day | ORAL | Status: DC
  Administered 2022-08-03 – 2022-08-14 (×12): 40 mg via ORAL
  Filled 2022-08-02 (×12): qty 1

## 2022-08-02 MED ORDER — INSULIN DEGLUDEC 100 UNIT/ML ~~LOC~~ SOPN: 10.0000 [IU] | PEN_INJECTOR | Freq: Every day | SUBCUTANEOUS | Status: DC

## 2022-08-02 MED ORDER — AMLODIPINE BESYLATE 10 MG PO TABS
10.0000 mg | ORAL_TABLET | Freq: Every day | ORAL | Status: DC
  Administered 2022-08-03 – 2022-08-14 (×12): 10 mg via ORAL
  Filled 2022-08-02 (×12): qty 1

## 2022-08-02 MED ORDER — GABAPENTIN 300 MG PO CAPS
300.0000 mg | ORAL_CAPSULE | Freq: Every day | ORAL | Status: DC
  Administered 2022-08-02 – 2022-08-13 (×12): 300 mg via ORAL
  Filled 2022-08-02 (×13): qty 1

## 2022-08-02 MED ORDER — SODIUM CHLORIDE 0.9 % IV SOLN
2.0000 g | Freq: Three times a day (TID) | INTRAVENOUS | Status: AC
  Administered 2022-08-02 – 2022-08-09 (×21): 2 g via INTRAVENOUS
  Filled 2022-08-02 (×22): qty 12.5

## 2022-08-02 MED ORDER — VANCOMYCIN HCL 2000 MG/400ML IV SOLN
2000.0000 mg | Freq: Once | INTRAVENOUS | Status: AC
  Administered 2022-08-02: 2000 mg via INTRAVENOUS
  Filled 2022-08-02: qty 400

## 2022-08-02 MED ORDER — POTASSIUM CHLORIDE 10 MEQ/100ML IV SOLN
10.0000 meq | INTRAVENOUS | Status: AC
  Administered 2022-08-02 (×4): 10 meq via INTRAVENOUS
  Filled 2022-08-02 (×4): qty 100

## 2022-08-02 MED ORDER — ONDANSETRON HCL 4 MG/2ML IJ SOLN
4.0000 mg | Freq: Once | INTRAMUSCULAR | Status: AC
  Administered 2022-08-02: 4 mg via INTRAVENOUS
  Filled 2022-08-02: qty 2

## 2022-08-02 MED ORDER — INSULIN ASPART 100 UNIT/ML IJ SOLN
0.0000 [IU] | Freq: Three times a day (TID) | INTRAMUSCULAR | Status: DC
  Administered 2022-08-03 – 2022-08-04 (×4): 4 [IU] via SUBCUTANEOUS
  Administered 2022-08-04: 3 [IU] via SUBCUTANEOUS
  Administered 2022-08-04 – 2022-08-05 (×3): 4 [IU] via SUBCUTANEOUS
  Administered 2022-08-07: 7 [IU] via SUBCUTANEOUS
  Administered 2022-08-07 – 2022-08-08 (×3): 3 [IU] via SUBCUTANEOUS
  Administered 2022-08-08: 4 [IU] via SUBCUTANEOUS
  Administered 2022-08-08 – 2022-08-09 (×2): 7 [IU] via SUBCUTANEOUS
  Administered 2022-08-09 – 2022-08-10 (×2): 3 [IU] via SUBCUTANEOUS
  Administered 2022-08-10: 4 [IU] via SUBCUTANEOUS
  Administered 2022-08-10: 3 [IU] via SUBCUTANEOUS
  Administered 2022-08-11: 4 [IU] via SUBCUTANEOUS
  Administered 2022-08-11: 3 [IU] via SUBCUTANEOUS
  Administered 2022-08-12: 4 [IU] via SUBCUTANEOUS
  Administered 2022-08-12: 3 [IU] via SUBCUTANEOUS
  Administered 2022-08-13: 4 [IU] via SUBCUTANEOUS
  Administered 2022-08-13 – 2022-08-14 (×3): 3 [IU] via SUBCUTANEOUS

## 2022-08-02 MED ORDER — ONDANSETRON HCL 4 MG/2ML IJ SOLN
INTRAMUSCULAR | Status: AC
  Filled 2022-08-02: qty 2

## 2022-08-02 MED ORDER — FENTANYL CITRATE (PF) 100 MCG/2ML IJ SOLN
INTRAMUSCULAR | Status: AC
  Filled 2022-08-02: qty 2

## 2022-08-02 MED ORDER — FENTANYL CITRATE (PF) 100 MCG/2ML IJ SOLN
25.0000 ug | Freq: Once | INTRAMUSCULAR | Status: AC
  Administered 2022-08-02: 25 ug via INTRAVENOUS
  Filled 2022-08-02: qty 2

## 2022-08-02 MED ORDER — METRONIDAZOLE 500 MG/100ML IV SOLN
500.0000 mg | Freq: Two times a day (BID) | INTRAVENOUS | Status: AC
  Administered 2022-08-03 – 2022-08-10 (×15): 500 mg via INTRAVENOUS
  Filled 2022-08-02 (×15): qty 100

## 2022-08-02 MED ORDER — SODIUM CHLORIDE 0.9% FLUSH
3.0000 mL | Freq: Two times a day (BID) | INTRAVENOUS | Status: DC
  Administered 2022-08-02 – 2022-08-13 (×12): 3 mL via INTRAVENOUS

## 2022-08-02 MED ORDER — LIDOCAINE HCL (PF) 1 % IJ SOLN
INTRAMUSCULAR | Status: DC | PRN
  Administered 2022-08-02: 20 mL via INTRADERMAL

## 2022-08-02 MED ORDER — HEPARIN SODIUM (PORCINE) 1000 UNIT/ML IJ SOLN
INTRAMUSCULAR | Status: AC
  Filled 2022-08-02: qty 10

## 2022-08-02 MED ORDER — ONDANSETRON HCL 4 MG/2ML IJ SOLN
INTRAMUSCULAR | Status: DC | PRN
  Administered 2022-08-02 (×2): 4 mg via INTRAVENOUS

## 2022-08-02 MED ORDER — SODIUM CHLORIDE 0.9 % IV SOLN
250.0000 mL | INTRAVENOUS | Status: DC | PRN
  Administered 2022-08-02: 250 mL via INTRAVENOUS

## 2022-08-02 MED ORDER — LABETALOL HCL 5 MG/ML IV SOLN: 10.0000 mg | INTRAVENOUS | Status: DC | PRN

## 2022-08-02 MED ORDER — IODIXANOL 320 MG/ML IV SOLN
INTRAVENOUS | Status: DC | PRN
  Administered 2022-08-02: 135 mL via INTRA_ARTERIAL

## 2022-08-02 MED ORDER — HYDRALAZINE HCL 20 MG/ML IJ SOLN: 5.0000 mg | INTRAMUSCULAR | Status: DC | PRN

## 2022-08-02 MED ORDER — VANCOMYCIN HCL 750 MG/150ML IV SOLN
750.0000 mg | Freq: Two times a day (BID) | INTRAVENOUS | Status: DC
  Administered 2022-08-03 – 2022-08-04 (×4): 750 mg via INTRAVENOUS
  Filled 2022-08-02 (×5): qty 150

## 2022-08-02 MED ORDER — INSULIN GLARGINE-YFGN 100 UNIT/ML ~~LOC~~ SOLN
10.0000 [IU] | Freq: Every day | SUBCUTANEOUS | Status: DC
  Administered 2022-08-02 – 2022-08-13 (×12): 10 [IU] via SUBCUTANEOUS
  Filled 2022-08-02 (×15): qty 0.1

## 2022-08-02 MED ORDER — LACTATED RINGERS IV BOLUS
1000.0000 mL | Freq: Once | INTRAVENOUS | Status: AC
  Administered 2022-08-02: 1000 mL via INTRAVENOUS

## 2022-08-02 MED ORDER — ONDANSETRON HCL 4 MG/2ML IJ SOLN
4.0000 mg | Freq: Four times a day (QID) | INTRAMUSCULAR | Status: DC | PRN
  Administered 2022-08-02 – 2022-08-04 (×3): 4 mg via INTRAVENOUS
  Filled 2022-08-02 (×3): qty 2

## 2022-08-02 MED ORDER — CLOPIDOGREL BISULFATE 75 MG PO TABS
75.0000 mg | ORAL_TABLET | Freq: Every day | ORAL | Status: DC
  Administered 2022-08-02 – 2022-08-14 (×13): 75 mg via ORAL
  Filled 2022-08-02 (×13): qty 1

## 2022-08-02 MED ORDER — SODIUM CHLORIDE 0.9 % IV SOLN: INTRAVENOUS | Status: AC

## 2022-08-02 SURGICAL SUPPLY — 27 items
BALLN MUSTANG 4X100X135 (BALLOONS) ×2
BALLN MUSTANG 5X120X135 (BALLOONS) ×2
BALLN MUSTANG 5X20X135 (BALLOONS) ×2
BALLOON MUSTANG 4X100X135 (BALLOONS) IMPLANT
BALLOON MUSTANG 5X120X135 (BALLOONS) IMPLANT
BALLOON MUSTANG 5X20X135 (BALLOONS) IMPLANT
CANISTER PENUMBRA ENGINE (MISCELLANEOUS) IMPLANT
CATH JETI 6FR 120 (CATHETERS) IMPLANT
CATH LIGHTNING 7 XTORQ 130 (CATHETERS) IMPLANT
CATH OMNI FLUSH 5F 65CM (CATHETERS) IMPLANT
GLIDEWIRE ADV .035X260CM (WIRE) IMPLANT
KIT ACCESSORY JETI (KITS) IMPLANT
KIT ENCORE 26 ADVANTAGE (KITS) IMPLANT
KIT MICROPUNCTURE NIT STIFF (SHEATH) IMPLANT
KIT PV (KITS) ×3 IMPLANT
SHEATH CATAPULT 7FR 45 (SHEATH) IMPLANT
SHEATH PINNACLE 5F 10CM (SHEATH) IMPLANT
SHEATH PINNACLE 7F 10CM (SHEATH) IMPLANT
SHEATH PROBE COVER 6X72 (BAG) IMPLANT
STENT ELUVIA 6X100X130 (Permanent Stent) IMPLANT
STOPCOCK MORSE 400PSI 3WAY (MISCELLANEOUS) IMPLANT
SYR MEDRAD MARK 7 150ML (SYRINGE) ×3 IMPLANT
TRANSDUCER W/STOPCOCK (MISCELLANEOUS) ×3 IMPLANT
TRAY PV CATH (CUSTOM PROCEDURE TRAY) ×3 IMPLANT
TUBING CIL FLEX 10 FLL-RA (TUBING) IMPLANT
WIRE SPARTACORE .014X300CM (WIRE) IMPLANT
WIRE STARTER BENTSON 035X150 (WIRE) IMPLANT

## 2022-08-02 NOTE — H&P (Signed)
History and Physical Interval Note:  08/02/2022 1:40 PM  Kayla Schmitt  has presented today for surgery, with the diagnosis of ischemia of both lower extremities.  The various methods of treatment have been discussed with the patient and family. After consideration of risks, benefits and other options for treatment, the patient has consented to  Procedure(s): ABDOMINAL AORTOGRAM W/LOWER EXTREMITY (N/A) as a surgical intervention.  The patient's history has been reviewed, patient examined, no change in status, stable for surgery.  I have reviewed the patient's chart and labs.  Questions were answered to the patient's satisfaction.    Patient now with bilateral lower extremity toe amputations recently performed by Dr. Sharol Given that are nonhealing.  She is very high risk for bilateral lower extremity limb loss.  Will access right groin today and evaluate recently placed left leg stents and also evaluate right leg runoff.  Marty Heck   Office Note        CC:  follow up Requesting Provider:  [facility], [facility]   HPI: Kayla Schmitt is a 57 y.o. (30-Jun-1965) female who was worked in urgently to office schedule today for evaluation of nonhealing toe amputations of bilateral lower extremities.  Toe amputations were performed by Dr. Sharol Given on 07/20/2022.  She underwent left SFA stenting and above-the-knee popliteal balloon angioplasty by Dr. Carlis Abbott on 07/05/2022.  She also has history of right SFA stenting by Dr. Carlis Abbott on 01/14/2020.  She is on her aspirin and Plavix daily.  She was started on Bactrim by Dr. Sharol Given today.  She denies fevers, chills, nausea/vomiting.  Past medical history also significant for insulin-dependent diabetes mellitus.         Past Medical History:  Diagnosis Date   Arthritis     Diabetes mellitus without complication      type 2   GERD (gastroesophageal reflux disease)     Hypercholesteremia     Hypertension     Hypothyroidism     PAD (peripheral  artery disease)     PONV (postoperative nausea and vomiting)     Thyroid disease             Past Surgical History:  Procedure Laterality Date   ABDOMINAL AORTOGRAM W/LOWER EXTREMITY Bilateral 01/14/2020    Procedure: ABDOMINAL AORTOGRAM W/LOWER EXTREMITY;  Surgeon: Marty Heck, MD;  Location: [facility];  Service: Cardiovascular;  Laterality: Bilateral;   ABDOMINAL AORTOGRAM W/LOWER EXTREMITY N/A 07/05/2022    Procedure: ABDOMINAL AORTOGRAM W/LOWER EXTREMITY;  Surgeon: Marty Heck, MD;  Location: [facility];  Service: Cardiovascular;  Laterality: N/A;   AMPUTATION Bilateral 07/20/2022    Procedure: AMPUTATION RIGHT GREAT TOE AND SECOND TOE, AMPUTATION LEFT GREAT TOE;  Surgeon: Newt Minion, MD;  Location: [facility];  Service: Orthopedics;  Laterality: Bilateral;   AMPUTATION TOE       PERIPHERAL VASCULAR ATHERECTOMY Right 01/14/2020    Procedure: PERIPHERAL VASCULAR ATHERECTOMY;  Surgeon: Marty Heck, MD;  Location: [facility];  Service: Cardiovascular;  Laterality: Right;  sfa   PERIPHERAL VASCULAR INTERVENTION Right 01/14/2020    Procedure: PERIPHERAL VASCULAR INTERVENTION;  Surgeon: Marty Heck, MD;  Location: [facility];  Service: Cardiovascular;  Laterality: Right;  sfa stent    PERIPHERAL VASCULAR INTERVENTION   07/05/2022    Procedure: PERIPHERAL VASCULAR INTERVENTION;  Surgeon: Marty Heck, MD;  Location: [facility];  Service: Cardiovascular;;   SKIN SPLIT GRAFT Right 03/18/2020  Procedure: SKIN GRAFTING RIGHT FOOT ULCER;  Surgeon: Newt Minion, MD;  Location: [facility];  Service: Orthopedics;  Laterality: Right;   WISDOM TOOTH EXTRACTION          Social History         Socioeconomic History   Marital status: Divorced      Spouse name: Not on file   Number of children: Not on file   Years of education: Not on file   Highest education level: Not on file  Occupational History   Not on file  Tobacco  Use   Smoking status: Former      Types: Cigarettes      Quit date: 01/2017      Years since quitting: 5.5   Smokeless tobacco: Never  Vaping Use   Vaping Use: Never used  Substance and Sexual Activity   Alcohol use: Not Currently   Drug use: Yes      Types: Marijuana      Comment: last use 03/12/20 - uses twice a month per pt 03/16/20   Sexual activity: Yes      Birth control/protection: Post-menopausal  Other Topics Concern   Not on file  Social History Narrative   Not on file    Social Determinants of Health    Financial Resource Strain: Not on file  Food Insecurity: Not on file  Transportation Needs: Not on file  Physical Activity: Not on file  Stress: Not on file  Social Connections: Not on file  Intimate Partner Violence: Not on file           Family History  Problem Relation Age of Onset   Diabetes Other     CAD Other              Current Outpatient Medications  Medication Sig Dispense Refill   acetaminophen (TYLENOL) 500 MG tablet Take 1,000 mg by mouth every 6 (six) hours as needed for moderate pain.       amLODipine (NORVASC) 10 MG tablet Take 10 mg by mouth daily.       aspirin 81 MG EC tablet Take 1 tablet by mouth 2 (two) times a week.       atorvastatin (LIPITOR) 40 MG tablet Take 40 mg by mouth daily.       clindamycin (CLEOCIN) 300 MG capsule Take 300 mg by mouth 3 (three) times daily.       clopidogrel (PLAVIX) 75 MG tablet TAKE 1 TABLET BY MOUTH EVERY DAY WITH BREAKFAST 90 tablet 3   collagenase (SANTYL) ointment Apply topically daily. 15 g 2   diphenhydrAMINE HCl, Sleep, (ZZZQUIL) 25 MG CAPS Take 25 mg by mouth at bedtime.       famotidine (PEPCID) 20 MG tablet Take 20 mg by mouth daily.       gabapentin (NEURONTIN) 300 MG capsule Take 300 mg by mouth daily.       ibuprofen (ADVIL) 600 MG tablet Take 1 tablet (600 mg total) by mouth every 6 (six) hours as needed. 30 tablet 0   insulin degludec (TRESIBA FLEXTOUCH) 100 UNIT/ML FlexTouch Pen Inject  10 Units into the skin at bedtime. (Patient taking differently: Inject 16-18 Units into the skin at bedtime.) 3 mL 3   levofloxacin (LEVAQUIN) 500 MG tablet Take 500 mg by mouth daily.       levothyroxine (SYNTHROID) 150 MCG tablet Take 150 mcg by mouth daily.       lisinopril-hydrochlorothiazide (ZESTORETIC) 20-25 MG tablet Take 2 tablets by  mouth daily.        mupirocin ointment (BACTROBAN) 2 % Apply topically 2 (two) times daily.       nitroGLYCERIN (NITRODUR - DOSED IN MG/24 HR) 0.2 mg/hr patch Place 1 patch (0.2 mg total) onto the skin daily. 30 patch 12   nitroGLYCERIN (NITRODUR - DOSED IN MG/24 HR) 0.2 mg/hr patch Place 1 patch (0.2 mg total) onto the skin daily. 30 patch 12   oxyCODONE-acetaminophen (PERCOCET/ROXICET) 5-325 MG tablet Take 1 tablet by mouth every 4 (four) hours as needed. 30 tablet 0   pantoprazole (PROTONIX) 40 MG tablet Take 40 mg by mouth daily.       pentoxifylline (TRENTAL) 400 MG CR tablet Take 1 tablet (400 mg total) by mouth 3 (three) times daily with meals. 90 tablet 3   Semaglutide, 1 MG/DOSE, (OZEMPIC, 1 MG/DOSE,) 2 MG/1.5ML SOPN Inject 1 mg into the skin every Saturday.       sulfamethoxazole-trimethoprim (BACTRIM DS) 800-160 MG tablet Take 1 tablet by mouth 2 (two) times daily. 40 tablet 0    No current facility-administered medications for this visit.           Allergies  Allergen Reactions   Penicillins Rash      Did it involve swelling of the face/tongue/throat, SOB, or low BP? no Did it involve sudden or severe rash/hives, skin peeling, or any reaction on the inside of your mouth or nose? yes Did you need to seek medical attention at a hospital or doctor's office? yes When did it last happen?      childhood allergy If all above answers are "NO", may proceed with cephalosporin use.     Doxycycline Nausea And Vomiting      Extremely N/V   Trental [Pentoxifylline] Nausea And Vomiting        REVIEW OF SYSTEMS:    [X]  denotes positive finding, [ ]   denotes negative finding Cardiac   Comments:  Chest pain or chest pressure:      Shortness of breath upon exertion:      Short of breath when lying flat:      Irregular heart rhythm:             Vascular      Pain in calf, thigh, or hip brought on by ambulation:      Pain in feet at night that wakes you up from your sleep:       Blood clot in your veins:      Leg swelling:              Pulmonary      Oxygen at home:      Productive cough:       Wheezing:              Neurologic      Sudden weakness in arms or legs:       Sudden numbness in arms or legs:       Sudden onset of difficulty speaking or slurred speech:      Temporary loss of vision in one eye:       Problems with dizziness:              Gastrointestinal      Blood in stool:       Vomited blood:              Genitourinary      Burning when urinating:       Blood in urine:  Psychiatric      Major depression:              Hematologic      Bleeding problems:      Problems with blood clotting too easily:             Skin      Rashes or ulcers:             Constitutional      Fever or chills:          PHYSICAL EXAMINATION:      Vitals:    07/31/22 1503  BP: 111/61  Pulse: 86  Resp: 20  Temp: 98.7 F (37.1 C)  TempSrc: Temporal  SpO2: 100%  Weight: 199 lb (90.3 kg)  Height: 5\' 4"  (1.626 m)      General:  WDWN in NAD; vital signs documented above Gait: Not observed HENT: WNL, normocephalic Pulmonary: normal non-labored breathing , without Rales, rhonchi,  wheezing Cardiac: regular HR Abdomen: soft, NT, no masses Skin: without rashes Vascular Exam/Pulses: Palpable right femoral pulse; brisk DP signals bilaterally by Doppler; brisk right PT signal by Doppler, soft left PT signal by Doppler Extremities: Nonhealing toe amputation sites of both feet pictured below Musculoskeletal: no muscle wasting or atrophy       Neurologic: A&O X 3 Psychiatric:  The pt has Normal affect.              Non-Invasive Vascular Imaging:   Distal left SFA stent occluded with low flow velocity in the below the knee popliteal   Noncompressible right tibials Left ABI 0.4       ASSESSMENT/PLAN:: 57 y.o. female here for evaluation of nonhealing bilateral lower extremity toe amputations with history of right SFA stent as well as recent left SFA stenting   -Duplex demonstrates an occluded left distal SFA stent with a left ABI of 0.4.  Right lower extremity arterial duplex demonstrates a patent right SFA stent from February of this year.  At any rate she has nonhealing surgical wounds of both feet.  She was evaluated by Dr. Sharol Given today who started her on Bactrim.  She will need repeat aortogram with bilateral lower extremity runoff.  Priority will be given to the left lower extremity to revascularize the occluded left SFA however Dr. Carlis Abbott will also try to perform a diagnostic angiogram of the right lower extremity.  This will be performed on Thursday, 08/02/2022.  Patient will likely be admitted postoperatively for potential bilateral lower extremity wound debridement by Dr. Sharol Given of the following day.  Dr. Carlis Abbott discussed with the patient today that she is at risk for amputation of both legs.  She will stay on her aspirin and Plavix perioperatively.  Case was discussed with the patient as well as her mother and they agree to proceed on Thursday in the Cath Lab with Dr. Carlis Abbott.     Dagoberto Ligas, PA-C [facility] [phone]   Clinic MD:   Carlis Abbott

## 2022-08-02 NOTE — Progress Notes (Signed)
CRITICAL RESULT PROVIDER NOTIFICATION  Test performed and critical result: K: 2.5 and Hgb: 5.1  Date and time result received: 08/02/22 at 1015  Provider name/title: Carlis Abbott, MD   Date and time provider notified: Carlis Abbott, MD in procedure. Rennis Harding, RN (Cath Lab Liaison) notified on 08/02/22 at 1020  Date and time provider responded: 08/02/22 at 1045  Provider response:See new orders

## 2022-08-02 NOTE — Progress Notes (Signed)
Site area: rt groin 7 F arterial sheath Site Prior to Removal:  Level 0 Pressure Applied For: 30 minutes Manual:   yes Patient Status During Pull:  stable Post Pull Site:  Level 0 Post Pull Instructions Given:  yes Post Pull Pulses Present: rt pt dopplered Dressing Applied:  gauze  and tegaderm Bedrest begins @ 1910 Comments:

## 2022-08-02 NOTE — Progress Notes (Signed)
Pt due to receive LR bolus as well as K+ runs. Second IV placed to accommodate.

## 2022-08-02 NOTE — Progress Notes (Signed)
Pharmacy Antibiotic Note  Kayla Schmitt is a 57 y.o. female admitted on 08/02/2022 with  wound infection .  Pharmacy has been consulted for vancomycin dosing.  CrCl 80-90 mL/min Tolerated ancef intra-op - childhood PCN allergy  Plan: Vancomycin 2000mg  IV once then 750mg  Q12H (eAUC 531, Scr 0.8 rounded up, Vd 0.5) Cefepime 2g Q8H Flagyl 500mg  Q12H  Height: 5\' 4"  (162.6 cm) Weight: 91.8 kg (202 lb 6.1 oz) IBW/kg (Calculated) : 54.7  Temp (24hrs), Avg:97.4 F (36.3 C), Min:96.5 F (35.8 C), Max:98.2 F (36.8 C)  Recent Labs  Lab 08/02/22 1017 08/02/22 1103 08/02/22 1647  WBC  --  11.9*  --   CREATININE 0.70 1.03* 0.70    Estimated Creatinine Clearance: 87.2 mL/min (by C-G formula based on SCr of 0.7 mg/dL).    Allergies  Allergen Reactions   Penicillins Rash    Did it involve swelling of the face/tongue/throat, SOB, or low BP? no Did it involve sudden or severe rash/hives, skin peeling, or any reaction on the inside of your mouth or nose? yes Did you need to seek medical attention at a hospital or doctor's office? yes When did it last happen?      childhood allergy If all above answers are "NO", may proceed with cephalosporin use.    Doxycycline Nausea And Vomiting    Extremely N/V   Trental [Pentoxifylline] Nausea And Vomiting    Antimicrobials this admission: Vancomycin 4/4 >> Cefepime 4/4 >> Flagyl 4/4 >>   Thank you for allowing pharmacy to be a part of this patient's care.  Merrilee Jansky, PharmD Clinical Pharmacist 08/02/2022 8:55 PM

## 2022-08-02 NOTE — Op Note (Signed)
Patient name: Kayla Schmitt MRN: [id number] DOB: 1965/09/23 Sex: female  08/02/2022 Pre-operative Diagnosis: Critical limb ischemia of bilateral lower extremities with nonhealing toe amputations Post-operative diagnosis:  Same Surgeon:  Marty Heck, MD Procedure Performed: 1.  Ultrasound-guided access right common femoral artery 2.  Aortogram with catheter selection of aorta 3.  Bilateral lower extremity arteriogram with selection of third order branches in the left lower extremity 4.  Left SFA and above-knee popliteal artery percutaneous mechanical thrombectomy of occluded distal SFA/above knee popliteal artery stent (Penumbra lightening 7 and JETI thrombectomy device) 5.  Angioplasty and stent of left above-knee popliteal artery more distal to existing stents (6 mm x 100 mm Eluvia) 6.  Angioplasty of existing left SFA/above knee popliteal artery stents with a 5 mm Mustang 7.  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  Most recently underwent left SFA stenting and above-knee popliteal artery stenting on 07/05/2022 for new tissue loss.  She had bilateral toe amputations with orthopedic surgery that are now necrotic and nonhealing.  She presents today for bilateral lower extremity arteriogram with possible intervention as her duplex showed the distal left SFA stent was occluded.  Will also evaluate the runoff on the right side.  Findings:   Aortogram showed no flow-limiting stenosis in the aortoiliac segment.  Left lower extremity arteriogram showed no flow-limiting stenosis in the common femoral although this is heavily diseased.  The profunda has a high grade stenosis.  The proximal to mid SFA stents were patent.  The most distal stent at the distal SFA at Hunter's canal into the  above-knee popliteal artery was occluded with multiple collaterals.  Distally there was three-vessel runoff.  Ultimately we were able to get down to the occluded stents and initially tried percutaneous mechanical thrombectomy with the penumbra lightening 7.  This catheter would not track which I thought was due to the angulation of the tip.  We then tried the JETI and I made several passes with the different thrombectomy device that had a straight tip and this tracked more easily.  Ultimately elected to extend the Eluvia stents on the left after we got a flow channel with an additional 6 mm x 100 mm Eluvia into the above knee popliteal artery.  We postdilated all of her existing left SFA stents with 5 mm angioplasty balloon except for a 4 mm balloon distally at the popliteal artery which was smaller in caliber.  Widely patent stents at completion with preserved three vessel runoff.    Right lower extremity arteriogram showed patent common femoral with focal occlusion of the profunda.  SFA has multiple segments of high-grade stenosis including in-stent stenosis.  Distally three vessel runoff.  Significant small vessel disease in the foot.   Procedure:  The patient was identified in the holding area and taken to room 8.  The patient was then placed supine on the table and prepped and draped in the usual sterile fashion.  A time out was called.  The patient received Versed and fentanyl for conscious moderate sedation.  Vital signs were monitored including heart rate, respiratory rate, oxygenation and blood pressure.  I was present for all of sedation.  Ultrasound was used to evaluate the right common femoral artery.  It was patent .  A digital ultrasound image was acquired.  A micropuncture needle was  used to access the right common femoral artery under ultrasound guidance.  An 018 wire was advanced without resistance and a micropuncture sheath was placed.  The 018 wire was removed and a benson wire was placed.   The micropuncture sheath was exchanged for a 5 french sheath.  An omniflush catheter was advanced over the wire to the level of L-1.  An abdominal angiogram was obtained.  The catheter was pulled down and bilateral lower extremity runoff was obtained.  Pertinent findings are noted above.  Ultimately elected to intervene on the occluded distal SFA above-knee popliteal stent in the left leg.  I used a Glidewire advantage and cannulated the left SFA after crossing the aortic bifurcation with a Omni Flush catheter.  We placed a long 7 French Catapault sheath in the right groin over the aortic bifurcation.  Patient was given 100 units/kg IV heparin.  I then used initially the penumbra lightening 7 and went down to perform percutaneous mechanical thrombectomy.  This catheter is very angulated and we had trouble getting it to track through the existing stents which I thought was likely due to a stent strut.  I then ultimately after multiple attempts trying to turn the catheter elected to use a different device and switched to the [place] thrombectomy device that is 6 [place] and straight catheter.  This was passed all the way down to the distal stent and we made several passes until we had a flow channel.  I then elected to realign this with a new 6 mm x 100 mm drug-coated Eluvia placed more distal into the above-knee popliteal artery.  I then performed repeat angioplasty of all of her existing SFA stents with long 5 mm angioplasty balloon and we used a 4 mm angioplasty balloon distally in the above-knee popliteal artery where the vessel was smaller.  She now has inline flow down the left SFA above-knee popliteal stents with preserved three-vessel runoff.  Significant small vessel disease in the foot.  Wires and catheters were removed.  Should be taken holding for sheath removal.   Plan: Continue aspirin Plavix statin.  Will arrange for right leg intervention in staged fashion for in-stent stenosis.  Dr. Sharol Given plans possible  bilateral TMA's.  Remains high risk for limb loss.  Marty Heck, MD [facility] Office: [phone]

## 2022-08-03 ENCOUNTER — Other Ambulatory Visit: Payer: Self-pay

## 2022-08-03 ENCOUNTER — Encounter (HOSPITAL_COMMUNITY): Payer: Self-pay | Admitting: Vascular Surgery

## 2022-08-03 LAB — COMPREHENSIVE METABOLIC PANEL
ALT: 17 U/L (ref 0–44)
AST: 16 U/L (ref 15–41)
Albumin: 1.7 g/dL — ABNORMAL LOW (ref 3.5–5.0)
Alkaline Phosphatase: 257 U/L — ABNORMAL HIGH (ref 38–126)
Anion gap: 12 (ref 5–15)
BUN: 24 mg/dL — ABNORMAL HIGH (ref 6–20)
CO2: 19 mmol/L — ABNORMAL LOW (ref 22–32)
Calcium: 8 mg/dL — ABNORMAL LOW (ref 8.9–10.3)
Chloride: 100 mmol/L (ref 98–111)
Creatinine, Ser: 0.95 mg/dL (ref 0.44–1.00)
GFR, Estimated: >60 mL/min (ref >60)
Glucose, Bld: 232 mg/dL — ABNORMAL HIGH (ref 70–99)
Potassium: 4 mmol/L (ref 3.5–5.1)
Sodium: 131 mmol/L — ABNORMAL LOW (ref 135–145)
Total Bilirubin: 0.7 mg/dL (ref 0.3–1.2)
Total Protein: 6.3 g/dL — ABNORMAL LOW (ref 6.5–8.1)

## 2022-08-03 LAB — CBC
HCT: 21.4 % — ABNORMAL LOW (ref 36.0–46.0)
Hemoglobin: 6.8 g/dL — CL (ref 12.0–15.0)
MCH: 26.7 pg (ref 26.0–34.0)
MCHC: 31.8 g/dL (ref 30.0–36.0)
MCV: 83.9 fL (ref 80.0–100.0)
Platelets: 449 10*3/uL — ABNORMAL HIGH (ref 150–400)
RBC: 2.55 MIL/uL — ABNORMAL LOW (ref 3.87–5.11)
RDW: 14.1 % (ref 11.5–15.5)
WBC: 14 10*3/uL — ABNORMAL HIGH (ref 4.0–10.5)
nRBC: 0 % (ref 0.0–0.2)

## 2022-08-03 LAB — GLUCOSE, CAPILLARY
Glucose-Capillary: 196 mg/dL — ABNORMAL HIGH (ref 70–99)
Glucose-Capillary: 181 mg/dL — ABNORMAL HIGH (ref 70–99)
Glucose-Capillary: 177 mg/dL — ABNORMAL HIGH (ref 70–99)
Glucose-Capillary: 161 mg/dL — ABNORMAL HIGH (ref 70–99)

## 2022-08-03 LAB — HEMOGLOBIN A1C
Hgb A1c MFr Bld: 6.4 % — ABNORMAL HIGH (ref 4.8–5.6)
Mean Plasma Glucose: 137 mg/dL

## 2022-08-03 LAB — PREPARE RBC (CROSSMATCH)

## 2022-08-03 LAB — LIPID PANEL
Cholesterol: 92 mg/dL (ref 0–200)
HDL: 13 mg/dL — ABNORMAL LOW (ref >40)
LDL Cholesterol: 54 mg/dL (ref 0–99)
Total CHOL/HDL Ratio: 7.1 RATIO
Triglycerides: 127 mg/dL (ref <150)
VLDL: 25 mg/dL (ref 0–40)

## 2022-08-03 LAB — HEMOGLOBIN AND HEMATOCRIT, BLOOD
HCT: 23.5 % — ABNORMAL LOW (ref 36.0–46.0)
Hemoglobin: 7.7 g/dL — ABNORMAL LOW (ref 12.0–15.0)

## 2022-08-03 LAB — BPAM RBC: Blood Product Expiration Date: 202404122359

## 2022-08-03 MED ORDER — SODIUM CHLORIDE 0.9% IV SOLUTION: Freq: Once | INTRAVENOUS | Status: AC

## 2022-08-03 MED ORDER — SODIUM CHLORIDE 0.9 % IV SOLN
12.5000 mg | Freq: Four times a day (QID) | INTRAVENOUS | Status: DC | PRN
  Administered 2022-08-03 – 2022-08-04 (×3): 12.5 mg via INTRAVENOUS
  Filled 2022-08-03 (×4): qty 0.5

## 2022-08-03 NOTE — Progress Notes (Signed)
CRITICAL VALUE STICKER  CRITICAL VALUE: Hemoglobin 6.8  RECEIVER (on-site recipient of call): Corinna Lines RN  DATE & TIME NOTIFIED: 08/03/22  0147  MESSENGER (representative from lab):  MD NOTIFIED: Sherald Hess MD  TIME OF NOTIFICATION: 0200  RESPONSE:  Verbal order for 1 Unit of blood

## 2022-08-03 NOTE — Progress Notes (Addendum)
[facility]  Subjective  - No pain, no new issues post procedure    Objective 120/63 84 97.9 F (36.6 C) (Oral) 20 94%  Intake/Output Summary (Last 24 hours) at 08/03/2022 0658 Last data filed at 08/03/2022 0647 Gross per 24 hour  Intake 479.67 ml  Output --  Net 479.67 ml   l left, intact PT,peroneal, and AT detected with doppler. B Foot dressing changed per patient request Lungs non labored breathing N/V this am   Assessment/Planning: POD # 1 Left SFA and above-knee popliteal artery percutaneous mechanical thrombectomy of occluded distal SFA/above knee popliteal artery stent .  Angioplasty  and stent of left above-knee popliteal artery more distal to existing stents (6 mm x 100 mm Eluvia) and angioplasty of existing stent.  Post intervention she had 3 vessel runoff in the left LE AT is bet signal, weak PT no peroneal  Dr. Burna Forts findings: Right lower extremity arteriogram showed patent common femoral with focal occlusion of the profunda.  SFA has multiple segments of high-grade stenosis including in-stent stenosis.  Distally three vessel runoff.  Significant small vessel disease in the foot.  Plans for right LE  intervention in staged fashion for in-stent stenosis.  Dr. Lajoyce Corners plans possible bilateral TMA's.  Remains high risk for limb loss.   Mosetta Pigeon 08/03/2022 6:58 AM --  Laboratory Lab Results: Recent Labs    08/02/22 1103 08/02/22 1647 08/03/22 0056  WBC 11.9*  --  14.0*  HGB 7.4* 7.8* 6.8*  HCT 22.9* 23.0* 21.4*  PLT 365  --  449*   BMET Recent Labs    08/02/22 1103 08/02/22 1647 08/03/22 0056  NA 132* 132* 131*  K 3.2* 4.2 4.0  CL 95* 98 100  CO2 23  --  19*  GLUCOSE 219* 224* 232*  BUN 23* 20 24*  CREATININE 1.03* 0.70 0.95  CALCIUM 8.8*  --  8.0*    COAG No results found for: "INR", "PROTIME" No results found for: "PTT"  I have seen and evaluated the patient. I agree with the PA note as documented  above.  57 year old female that underwent left leg percutaneous thrombectomy with additional stenting for an occluded distal left SFA above-knee popliteal stent yesterday in the setting of nonhealing toe amputations.  Brisk posterior tibial signal this morning with good results.  Right groin looks good from transfemoral access.  Continue aspirin Plavix statin.  Will return to the Cath Lab on Monday for staged intervention in the right leg where she also has tissue loss and in-stent stenosis of remote intervention several years ago.  I discussed with Dr. Lajoyce Corners who is planning likely bilateral TMA's next week for bilateral non-healing toe amputations.  She will get 1 unit of packed red blood cells this morning for hemoglobin of 6.8 (7.8 pre-op).  Continue vancomycin cefepime Flagyl for bilateral foot infections and nonhealing toe amputations.  Blood cultures ordered yesterday with no growth.  Insulin sliding scale for her diabetes.  Potassium improved since we gave multiple runs yesterday in precath lab area.  Continue IV fluids for hydration.  Nauseated and vomiting on arrival yesterday.  This appears improved today.  She has Zosyn and Phenergan ordered.  Remains very high risk for bilateral limb loss.  Cephus Shelling, MD [facility] Office: [phone]

## 2022-08-03 NOTE — Progress Notes (Signed)
Changed dressings to bilateral feet for toe amputation sites. Per pt applied padded 4x4 gauze and kerlix. Washed lower extremities. Pt tolerated well. Pt resting with call bell within reach.  Will continue to monitor.

## 2022-08-04 LAB — COMPREHENSIVE METABOLIC PANEL
ALT: 12 U/L (ref 0–44)
AST: 16 U/L (ref 15–41)
Albumin: 1.9 g/dL — ABNORMAL LOW (ref 3.5–5.0)
Alkaline Phosphatase: 240 U/L — ABNORMAL HIGH (ref 38–126)
Anion gap: 13 (ref 5–15)
BUN: 27 mg/dL — ABNORMAL HIGH (ref 6–20)
CO2: 18 mmol/L — ABNORMAL LOW (ref 22–32)
Calcium: 8.4 mg/dL — ABNORMAL LOW (ref 8.9–10.3)
Chloride: 102 mmol/L (ref 98–111)
Creatinine, Ser: 0.99 mg/dL (ref 0.44–1.00)
GFR, Estimated: >60 mL/min (ref >60)
Glucose, Bld: 184 mg/dL — ABNORMAL HIGH (ref 70–99)
Potassium: 3.5 mmol/L (ref 3.5–5.1)
Sodium: 133 mmol/L — ABNORMAL LOW (ref 135–145)
Total Bilirubin: 1.1 mg/dL (ref 0.3–1.2)
Total Protein: 6.9 g/dL (ref 6.5–8.1)

## 2022-08-04 LAB — CBC
HCT: 25.7 % — ABNORMAL LOW (ref 36.0–46.0)
Hemoglobin: 8.2 g/dL — ABNORMAL LOW (ref 12.0–15.0)
MCH: 27.2 pg (ref 26.0–34.0)
MCHC: 31.9 g/dL (ref 30.0–36.0)
MCV: 85.4 fL (ref 80.0–100.0)
Platelets: 392 10*3/uL (ref 150–400)
RBC: 3.01 MIL/uL — ABNORMAL LOW (ref 3.87–5.11)
RDW: 14.4 % (ref 11.5–15.5)
WBC: 15 10*3/uL — ABNORMAL HIGH (ref 4.0–10.5)
nRBC: 0 % (ref 0.0–0.2)

## 2022-08-04 LAB — BPAM RBC
ISSUE DATE / TIME: 202404050353
Unit Type and Rh: 5100

## 2022-08-04 LAB — GLUCOSE, CAPILLARY
Glucose-Capillary: 168 mg/dL — ABNORMAL HIGH (ref 70–99)
Glucose-Capillary: 159 mg/dL — ABNORMAL HIGH (ref 70–99)
Glucose-Capillary: 149 mg/dL — ABNORMAL HIGH (ref 70–99)
Glucose-Capillary: 123 mg/dL — ABNORMAL HIGH (ref 70–99)

## 2022-08-04 LAB — TYPE AND SCREEN
ABO/RH(D): O POS
Antibody Screen: NEGATIVE
Unit division: 0

## 2022-08-04 NOTE — Progress Notes (Addendum)
  VASCULAR SURGERY ASSESSMENT & PLAN:   POD 2 S/P mechanical thrombectomy of left SFA and popliteal artery stent/angioplasty and stenting of left popliteal artery: She has brisk Doppler signals in the left foot.  INFRAINGUINAL ARTERIAL OCCLUSIVE DISEASE (RIGHT): Patient is scheduled for arteriography and intervention on the right leg on Monday.  BILATERAL FOOT WOUNDS: I will have them start soaking the daily and Dial soap soaks to help dry these out.  She has significant odor from her infected wounds.  Continue daily dressing changes.  Dr. Lajoyce Corners plans to work on both feet Wednesday.  VASCULAR QUALITY INITIATIVE: She is on aspirin, Plavix, and statin.  ID: She is on intravenous Maxipime, vancomycin, and Flagyl.   SUBJECTIVE:   No specific complaints this morning.  PHYSICAL EXAM:   Vitals:   08/03/22 1940 08/03/22 2310 08/04/22 0339 08/04/22 0729  BP: 121/64 125/69 117/65 123/69  Pulse: 77 85 87 92  Resp: 18 16 17 16   Temp: 98.3 F (36.8 C)  98.2 F (36.8 C) 98.8 F (37.1 C)  TempSrc: Oral Oral Oral Oral  SpO2: 98% 92% 98% 97%  Weight:      Height:       She has brisk Doppler signals in the left foot. Significant necrotic tissue both feet.      LABS:   Lab Results  Component Value Date   WBC 14.0 (H) 08/03/2022   HGB 7.7 (L) 08/03/2022   HCT 23.5 (L) 08/03/2022   MCV 83.9 08/03/2022   PLT 449 (H) 08/03/2022   Lab Results  Component Value Date   CREATININE 0.95 08/03/2022   CBG (last 3)  Recent Labs    08/03/22 1552 08/03/22 2109 08/04/22 0557  GLUCAP 177* 161* 168*    PROBLEM LIST:    Principal Problem:   Critical limb ischemia of both lower extremities   CURRENT MEDS:    amLODipine  10 mg Oral Daily   [START ON 08/06/2022] aspirin EC  81 mg Oral Once per day on Mon Thu   atorvastatin  40 mg Oral Daily   clopidogrel  75 mg Oral Daily   gabapentin  300 mg Oral Daily   insulin aspart  0-20 Units Subcutaneous TID WC   insulin glargine-yfgn  10 Units  Subcutaneous QHS   levothyroxine  150 mcg Oral Daily   sodium chloride flush  3 mL Intravenous Q12H    Waverly Ferrari  08/04/2022

## 2022-08-04 NOTE — Plan of Care (Signed)
  Problem: Activity: Goal: Ability to return to baseline activity level will improve Outcome: Progressing   Problem: Fluid Volume: Goal: Ability to maintain a balanced intake and output will improve Outcome: Progressing   Problem: Nutritional: Goal: Maintenance of adequate nutrition will improve Outcome: Progressing   

## 2022-08-05 LAB — GLUCOSE, CAPILLARY
Glucose-Capillary: 101 mg/dL — ABNORMAL HIGH (ref 70–99)
Glucose-Capillary: 179 mg/dL — ABNORMAL HIGH (ref 70–99)
Glucose-Capillary: 168 mg/dL — ABNORMAL HIGH (ref 70–99)
Glucose-Capillary: 190 mg/dL — ABNORMAL HIGH (ref 70–99)

## 2022-08-05 LAB — COMPREHENSIVE METABOLIC PANEL
ALT: 13 U/L (ref 0–44)
AST: 17 U/L (ref 15–41)
Albumin: 1.7 g/dL — ABNORMAL LOW (ref 3.5–5.0)
Alkaline Phosphatase: 227 U/L — ABNORMAL HIGH (ref 38–126)
Anion gap: 6 (ref 5–15)
BUN: 30 mg/dL — ABNORMAL HIGH (ref 6–20)
CO2: 19 mmol/L — ABNORMAL LOW (ref 22–32)
Calcium: 7.5 mg/dL — ABNORMAL LOW (ref 8.9–10.3)
Chloride: 107 mmol/L (ref 98–111)
Creatinine, Ser: 1.05 mg/dL — ABNORMAL HIGH (ref 0.44–1.00)
GFR, Estimated: >60 mL/min (ref >60)
Glucose, Bld: 131 mg/dL — ABNORMAL HIGH (ref 70–99)
Potassium: 3.5 mmol/L (ref 3.5–5.1)
Sodium: 132 mmol/L — ABNORMAL LOW (ref 135–145)
Total Bilirubin: 0.9 mg/dL (ref 0.3–1.2)
Total Protein: 5.5 g/dL — ABNORMAL LOW (ref 6.5–8.1)

## 2022-08-05 LAB — CBC
HCT: 21.9 % — ABNORMAL LOW (ref 36.0–46.0)
Hemoglobin: 7.1 g/dL — ABNORMAL LOW (ref 12.0–15.0)
MCH: 27.4 pg (ref 26.0–34.0)
MCHC: 32.4 g/dL (ref 30.0–36.0)
MCV: 84.6 fL (ref 80.0–100.0)
Platelets: 401 10*3/uL — ABNORMAL HIGH (ref 150–400)
RBC: 2.59 MIL/uL — ABNORMAL LOW (ref 3.87–5.11)
RDW: 14.6 % (ref 11.5–15.5)
WBC: 13.8 10*3/uL — ABNORMAL HIGH (ref 4.0–10.5)
nRBC: 0 % (ref 0.0–0.2)

## 2022-08-05 LAB — VANCOMYCIN, PEAK: Vancomycin Pk: 31 ug/mL (ref 30–40)

## 2022-08-05 LAB — VANCOMYCIN, TROUGH: Vancomycin Tr: 23 ug/mL (ref 15–20)

## 2022-08-05 MED ORDER — VANCOMYCIN HCL IN DEXTROSE 1-5 GM/200ML-% IV SOLN
1000.0000 mg | INTRAVENOUS | Status: AC
  Administered 2022-08-05 – 2022-08-09 (×5): 1000 mg via INTRAVENOUS
  Filled 2022-08-05 (×5): qty 200

## 2022-08-05 MED ORDER — PANTOPRAZOLE SODIUM 40 MG PO TBEC
40.0000 mg | DELAYED_RELEASE_TABLET | Freq: Every day | ORAL | Status: DC
  Administered 2022-08-05 – 2022-08-07 (×3): 40 mg via ORAL
  Filled 2022-08-05 (×3): qty 1

## 2022-08-05 NOTE — Progress Notes (Signed)
Pharmacy Antibiotic Note  Kayla Schmitt is a 57 y.o. female admitted on 08/02/2022 with wound infection.  Pharmacy has been consulted for vancomycin dosing.  Patient reports improvement on IV abx. Afebrile with wbc 13.8 trending down. Plan for RLE angiography 4/8 as well as bilateral TMAs 4/10. Tolerated cephalosporin well (has childhood PCN allergy).   Vanc peak = 31. Vanc trough = 23. Confirmed levels drawn appropriately. Current eAUC with current vancomycin regimen 676.3 supratherapeutic. Will dose adjust vancomycin to achieve therapeutic levels.   Plan: Start vancomycin 1000 mg IV q24h (eAUC 450.8)  Continue cefepime 2g IV Q8H Continue metronidazole 500mg  IV Q12H Monitor renal function, culture results, clinical status and resolution of s/sx infection Narrow abx as able and f/u with appropriate duration  Height: 5\' 4"  (162.6 cm) Weight: 91.8 kg (202 lb 6.1 oz) IBW/kg (Calculated) : 54.7  Temp (24hrs), Avg:98.6 F (37 C), Min:98.2 F (36.8 C), Max:99.2 F (37.3 C)  Recent Labs  Lab 08/02/22 1103 08/02/22 1647 08/03/22 0056 08/04/22 0801 08/05/22 0125 08/05/22 0820  WBC 11.9*  --  14.0* 15.0* 13.8*  --   CREATININE 1.03* 0.70 0.95 0.99 1.05*  --   VANCOTROUGH  --   --   --   --   --  23*  VANCOPEAK  --   --   --   --  31  --     Estimated Creatinine Clearance: 66.4 mL/min (A) (by C-G formula based on SCr of 1.05 mg/dL (H)).    Allergies  Allergen Reactions   Doxycycline Nausea And Vomiting    Extremely N/V   Trental [Pentoxifylline] Nausea And Vomiting   Penicillins Rash    Tolerated cephalosporins 07/2022     Antimicrobials this admission: Vancomycin 4/4 >> Cefepime 4/4 >> Metronidazole 4/4 >>  Microbiology 4/4 Bcx x2: NGTD x3D   Thank you for allowing pharmacy to be a part of this patient's care.  Jerry Caras, PharmD PGY1 Pharmacy Resident   08/05/2022 1:09 PM

## 2022-08-05 NOTE — Progress Notes (Addendum)
  Progress Note    08/05/2022 10:13 AM 3 Days Post-Op  Subjective:  no complaints    Vitals:   08/05/22 0408 08/05/22 0755  BP: (!) 110/56 120/70  Pulse: 72 81  Resp: 16 17  Temp: 98.3 F (36.8 C) 98.7 F (37.1 C)  SpO2: 97% 100%    Physical Exam: Cardiac:  regular Lungs:  nonlabored Extremities:  brisk DP/PT doppler signals in the left foot. Bilateral feet dressed with gauze and kerlix  CBC    Component Value Date/Time   WBC 13.8 (H) 08/05/2022 0125   RBC 2.59 (L) 08/05/2022 0125   HGB 7.1 (L) 08/05/2022 0125   HCT 21.9 (L) 08/05/2022 0125   PLT 401 (H) 08/05/2022 0125   MCV 84.6 08/05/2022 0125   MCH 27.4 08/05/2022 0125   MCHC 32.4 08/05/2022 0125   RDW 14.6 08/05/2022 0125   LYMPHSABS 2.3 02/04/2022 1721   MONOABS 0.6 02/04/2022 1721   EOSABS 0.2 02/04/2022 1721   BASOSABS 0.0 02/04/2022 1721    BMET    Component Value Date/Time   NA 132 (L) 08/05/2022 0125   K 3.5 08/05/2022 0125   CL 107 08/05/2022 0125   CO2 19 (L) 08/05/2022 0125   GLUCOSE 131 (H) 08/05/2022 0125   BUN 30 (H) 08/05/2022 0125   CREATININE 1.05 (H) 08/05/2022 0125   CALCIUM 7.5 (L) 08/05/2022 0125   GFRNONAA >60 08/05/2022 0125   GFRAA >60 01/15/2020 0836    INR No results found for: "INR"   Intake/Output Summary (Last 24 hours) at 08/05/2022 1013 Last data filed at 08/04/2022 1500 Gross per 24 hour  Intake 406.7 ml  Output --  Net 406.7 ml      Assessment/Plan:  57 y.o. female is 3 days post-op, s/p: left SFA and popliteal artery mechanical thrombectomy with angioplasty and stenting of the popliteal artery   -The patient is doing well this morning. Her feet continue to feel better on IV Abx. She denies any pain -She has brisk doppler signals in the left foot. Both of her feet are dressed with gauze and kerlix. -Continue ASA, plavix, and statin. Continue abx per ID -She has been pre-op'd and is agreeable to RLE angiography with [facility] tomorrow. She will be NPO past  midnight. Consent orders placed -Dr.Duda plans for bilateral TMAs on Wednesday  Loel Dubonnet, [place] [facility] [phone] 08/05/2022 10:13 AM   I have interviewed the patient and examined the patient. I agree with the findings by the PA.  She is preop for arteriography tomorrow.  Dr. Lajoyce Corners plans to work on both feet Wednesday.  All of her questions were answered and she is agreeable to proceed.  She remains on intravenous Maxipime, vancomycin, and Flagyl.  Cari Caraway, MD

## 2022-08-06 ENCOUNTER — Encounter (HOSPITAL_COMMUNITY)
Admission: AD | Disposition: A | Discharge status: Home / Self Care | Payer: Self-pay | Source: Home / Self Care | Attending: Vascular Surgery

## 2022-08-06 ENCOUNTER — Encounter: Payer: Self-pay | Admitting: Orthopedic Surgery

## 2022-08-06 DIAGNOSIS — T82858A Stenosis of vascular prosthetic devices, implants and grafts, initial encounter: Secondary | ICD-10-CM

## 2022-08-06 HISTORY — PX: ABDOMINAL AORTOGRAM W/LOWER EXTREMITY: CATH118223

## 2022-08-06 HISTORY — PX: PERIPHERAL VASCULAR INTERVENTION: CATH118257

## 2022-08-06 LAB — CBC
HCT: 19.9 % — ABNORMAL LOW (ref 36.0–46.0)
Hemoglobin: 6.5 g/dL — CL (ref 12.0–15.0)
MCH: 28 pg (ref 26.0–34.0)
MCHC: 32.7 g/dL (ref 30.0–36.0)
MCV: 85.8 fL (ref 80.0–100.0)
Platelets: 352 10*3/uL (ref 150–400)
RBC: 2.32 MIL/uL — ABNORMAL LOW (ref 3.87–5.11)
RDW: 15 % (ref 11.5–15.5)
WBC: 11.9 10*3/uL — ABNORMAL HIGH (ref 4.0–10.5)
nRBC: 0.2 % (ref 0.0–0.2)

## 2022-08-06 LAB — TYPE AND SCREEN: Antibody Screen: NEGATIVE

## 2022-08-06 LAB — POCT ACTIVATED CLOTTING TIME
Activated Clotting Time: 185 seconds
Activated Clotting Time: 185 seconds
Activated Clotting Time: 174 seconds
Activated Clotting Time: 239 seconds
Activated Clotting Time: 196 seconds
Activated Clotting Time: 168 seconds
Activated Clotting Time: 179 seconds

## 2022-08-06 LAB — COMPREHENSIVE METABOLIC PANEL
ALT: 14 U/L (ref 0–44)
AST: 17 U/L (ref 15–41)
Albumin: 1.6 g/dL — ABNORMAL LOW (ref 3.5–5.0)
Alkaline Phosphatase: 207 U/L — ABNORMAL HIGH (ref 38–126)
Anion gap: 5 (ref 5–15)
BUN: 24 mg/dL — ABNORMAL HIGH (ref 6–20)
CO2: 19 mmol/L — ABNORMAL LOW (ref 22–32)
Calcium: 7.4 mg/dL — ABNORMAL LOW (ref 8.9–10.3)
Chloride: 109 mmol/L (ref 98–111)
Creatinine, Ser: 1.05 mg/dL — ABNORMAL HIGH (ref 0.44–1.00)
GFR, Estimated: >60 mL/min (ref >60)
Glucose, Bld: 178 mg/dL — ABNORMAL HIGH (ref 70–99)
Potassium: 3.2 mmol/L — ABNORMAL LOW (ref 3.5–5.1)
Sodium: 133 mmol/L — ABNORMAL LOW (ref 135–145)
Total Bilirubin: 0.7 mg/dL (ref 0.3–1.2)
Total Protein: 5.4 g/dL — ABNORMAL LOW (ref 6.5–8.1)

## 2022-08-06 LAB — BPAM RBC

## 2022-08-06 LAB — HEMOGLOBIN AND HEMATOCRIT, BLOOD
HCT: 28 % — ABNORMAL LOW (ref 36.0–46.0)
Hemoglobin: 9.3 g/dL — ABNORMAL LOW (ref 12.0–15.0)

## 2022-08-06 LAB — GLUCOSE, CAPILLARY
Glucose-Capillary: 117 mg/dL — ABNORMAL HIGH (ref 70–99)
Glucose-Capillary: 104 mg/dL — ABNORMAL HIGH (ref 70–99)
Glucose-Capillary: 89 mg/dL (ref 70–99)
Glucose-Capillary: 114 mg/dL — ABNORMAL HIGH (ref 70–99)

## 2022-08-06 LAB — PREPARE RBC (CROSSMATCH)

## 2022-08-06 SURGERY — ABDOMINAL AORTOGRAM W/LOWER EXTREMITY
Anesthesia: LOCAL

## 2022-08-06 MED ORDER — SODIUM CHLORIDE 0.9 % IV SOLN: INTRAVENOUS | Status: AC

## 2022-08-06 MED ORDER — SODIUM CHLORIDE 0.9 % IV SOLN: INTRAVENOUS | Status: DC

## 2022-08-06 MED ORDER — MIDAZOLAM HCL 2 MG/2ML IJ SOLN
INTRAMUSCULAR | Status: DC | PRN
  Administered 2022-08-06: 1 mg via INTRAVENOUS

## 2022-08-06 MED ORDER — ONDANSETRON HCL 4 MG/2ML IJ SOLN: 4.0000 mg | Freq: Four times a day (QID) | INTRAMUSCULAR | Status: DC | PRN

## 2022-08-06 MED ORDER — FENTANYL CITRATE (PF) 100 MCG/2ML IJ SOLN
INTRAMUSCULAR | Status: AC
  Filled 2022-08-06: qty 2

## 2022-08-06 MED ORDER — HYDRALAZINE HCL 20 MG/ML IJ SOLN: 5.0000 mg | INTRAMUSCULAR | Status: DC | PRN

## 2022-08-06 MED ORDER — SODIUM CHLORIDE 0.9% FLUSH: 3.0000 mL | INTRAVENOUS | Status: DC | PRN

## 2022-08-06 MED ORDER — MIDAZOLAM HCL 2 MG/2ML IJ SOLN
INTRAMUSCULAR | Status: AC
  Filled 2022-08-06: qty 2

## 2022-08-06 MED ORDER — IODIXANOL 320 MG/ML IV SOLN
INTRAVENOUS | Status: DC | PRN
  Administered 2022-08-06: 45 mL via INTRA_ARTERIAL

## 2022-08-06 MED ORDER — SODIUM CHLORIDE 0.9% IV SOLUTION: Freq: Once | INTRAVENOUS | Status: AC

## 2022-08-06 MED ORDER — LIDOCAINE HCL (PF) 1 % IJ SOLN
INTRAMUSCULAR | Status: DC | PRN
  Administered 2022-08-06: 10 mL

## 2022-08-06 MED ORDER — LABETALOL HCL 5 MG/ML IV SOLN: 10.0000 mg | INTRAVENOUS | Status: DC | PRN

## 2022-08-06 MED ORDER — LIDOCAINE HCL (PF) 1 % IJ SOLN
INTRAMUSCULAR | Status: AC
  Filled 2022-08-06: qty 30

## 2022-08-06 MED ORDER — SODIUM CHLORIDE 0.9 % IV SOLN: 250.0000 mL | INTRAVENOUS | Status: DC | PRN

## 2022-08-06 MED ORDER — FENTANYL CITRATE (PF) 100 MCG/2ML IJ SOLN
INTRAMUSCULAR | Status: DC | PRN
  Administered 2022-08-06: 50 ug via INTRAVENOUS

## 2022-08-06 MED ORDER — HEPARIN SODIUM (PORCINE) 1000 UNIT/ML IJ SOLN
INTRAMUSCULAR | Status: DC | PRN
  Administered 2022-08-06: 10000 [IU] via INTRAVENOUS

## 2022-08-06 MED ORDER — HEPARIN (PORCINE) IN NACL 1000-0.9 UT/500ML-% IV SOLN
INTRAVENOUS | Status: DC | PRN
  Administered 2022-08-06 (×2): 500 mL

## 2022-08-06 MED ORDER — SODIUM CHLORIDE 0.9% FLUSH
3.0000 mL | Freq: Two times a day (BID) | INTRAVENOUS | Status: DC
  Administered 2022-08-09 – 2022-08-14 (×9): 3 mL via INTRAVENOUS

## 2022-08-06 SURGICAL SUPPLY — 21 items
CATH AURYON ATHERECTOMY 2.0 (CATHETERS) IMPLANT
CATH OMNI FLUSH 5F 65CM (CATHETERS) IMPLANT
CATH QUICKCROSS SUPP .035X90CM (MICROCATHETER) IMPLANT
DCB RANGER 5.0X200 150 (BALLOONS) IMPLANT
GUIDEWIRE ANGLED .035X150CM (WIRE) IMPLANT
GUIDEWIRE ANGLED .035X260CM (WIRE) IMPLANT
KIT ENCORE 26 ADVANTAGE (KITS) IMPLANT
KIT MICROPUNCTURE NIT STIFF (SHEATH) IMPLANT
KIT PV (KITS) ×3 IMPLANT
RANGER DCB 5.0X200 150 (BALLOONS) ×4
SHEATH CATAPULT 6FR 45 (SHEATH) IMPLANT
SHEATH PINNACLE 5F 10CM (SHEATH) IMPLANT
SHEATH PINNACLE 6F 10CM (SHEATH) IMPLANT
SHEATH PROBE COVER 6X72 (BAG) IMPLANT
STOPCOCK MORSE 400PSI 3WAY (MISCELLANEOUS) IMPLANT
SYR MEDRAD MARK 7 150ML (SYRINGE) ×3 IMPLANT
TRANSDUCER W/STOPCOCK (MISCELLANEOUS) ×3 IMPLANT
TRAY PV CATH (CUSTOM PROCEDURE TRAY) ×3 IMPLANT
TUBING CIL FLEX 10 FLL-RA (TUBING) IMPLANT
WIRE BENTSON .035X145CM (WIRE) IMPLANT
WIRE SPARTACORE .014X300CM (WIRE) IMPLANT

## 2022-08-06 NOTE — Op Note (Signed)
Patient name: Kayla Schmitt MRN: [id number] DOB: Mar 09, 1966 Sex: female  08/06/2022 Pre-operative Diagnosis: Critical limb ischemia of the right lower extremity with in-stent SFA stenosis and additional above-knee popliteal high-grade stenosis Post-operative diagnosis:  Same Surgeon:  Cephus Shelling, MD Procedure Performed: 1.  Ultrasound-guided access left common femoral artery 2.  Right lower extremity arteriogram with selection of third order branches 3.  Laser atherectomy of the right SFA and above-knee popliteal artery (2.0 mm Auryon laser at 50 MHz and 60 MHz) 4.  Drug-coated balloon angioplasty of the entire right SFA and above-knee popliteal artery (5.0 mm drug-coated Ranger) 5.  52 minutes of monitored moderate conscious sedation time  Indications: Patient is a 57 year old female who presented with nonhealing wounds to her bilateral lower extremities.  She underwent left leg intervention last week.  Today she presents for a staged right leg intervention where she has evidence of disease in the SFA including in-stent stenosis and additional above-knee popliteal artery high-grade stenosis.  She presents after risks benefits discussed.  Findings:   Aortogram was deferred given this was performed last week.  Right lower extremity arteriogram again showed a moderate stenosis in the proximal SFA, multiple high-grade stenosis greater than 80% in the previous SFA stents with in-stent stenosis, and a high-grade greater than 80% calcified above-knee popliteal stenosis.  The right SFA and above-knee popliteal artery was treated in its entirety with a 2.0 mm Auryon laser at 50 and 60 MHz.  The right SFA and above-knee popliteal artery was treated with drug-coated balloon angioplasty to nominal pressure for 3 minutes with a 5 mm drug coated Ranger.  Widely patent SFA above-knee popliteal artery with patent stents at completion.  Preserved three-vessel runoff distally.  Dominant runoff  in the foot is through the posterior tibial artery.   Procedure:  The patient was identified in the holding area and taken to room 8.  The patient was then placed supine on the table and prepped and draped in the usual sterile fashion.  A time out was called.  Ultrasound was used to evaluate the left common femoral artery.  It was patent .  A digital ultrasound image was acquired.  A micropuncture needle was used to access the left common femoral artery under ultrasound guidance.  An 018 wire was advanced without resistance and a micropuncture sheath was placed.  The 018 wire was removed and a benson wire was placed.  The micropuncture sheath was exchanged for a 5 french sheath.  An omniflush catheter was advanced over the wire to the level of L-1.  An abdominal angiogram was deferred given this was performed last week.  I crossed the aortic bifurcation with an Omni flush catheter and got a wire into the right SFA.  I exchanged for a long 6 French Catapault sheath in the left groin over the aortic bifurcation.  Patient was given 100 units/kg IV heparin.  I then got some hand shots to identify the SFA and above-knee popliteal lesions.  I then used a Glidewire with an 035 quick cross to cross the SFA above-knee popliteal disease and get my wire into the below-knee popliteal artery.  I then exchanged for a [place] core wire placed down the the right peroneal.  I then used 2.0 mm Auryon laser and made two passes at 50 and 60 MHz in the right SFA and above-knee popliteal artery.  I then treated the entire segment with a 5 mm drug-coated balloon Ranger for 3 minutes.  Final  imaging showed no dissection with preserved runoff.  Although she does have a calcified diseased artery there was no flow-limiting stenosis and all this was successfully treated.  Wires and catheters were removed.  I put a short 6 French sheath in the left groin should be taken holding for sheath removal.  Plan: Excellent results after right SFA  above-knee popliteal laser atherectomy with DCB.  Optimized for TMA with Dr. Lajoyce Corners on Wednesday.  Continue aspirin Plavix statin.     Cephus Shelling, MD [facility] [place] Office: [phone]

## 2022-08-06 NOTE — Progress Notes (Signed)
Office Visit Note   Patient: Kayla Schmitt           Date of Birth: 1965/11/17           MRN: [id number] Visit Date: 07/31/2022              Requested by: Medicine, [facility] [address] [place],  [place] [address] PCP: Medicine, [facility]  Chief Complaint  Patient presents with   Right Foot - Routine Post Op    GT and 2nd toe amputation 38 micro used and split between both feet.    Left Foot - Routine Post Op    GT amputation       HPI: Patient is a 57 year old woman who is status post right great toe and second toe amputation and left great toe amputation on 07/20/2022.  Patient presents at this time with odor drainage and dehiscence of the wounds.  Assessment & Plan: Visit Diagnoses:  1. Gangrene of toe of both feet     Plan: Will send patient to vascular vein surgery for emergent evaluation.  Anticipate patient will need revascularization evaluation for both lower extremities.  Recent ABIs showed monophasic with normal pressures.  Follow-Up Instructions: Return in about 2 weeks (around 08/14/2022).   Ortho Exam  Patient is alert, oriented, no adenopathy, well-dressed, normal affect, normal respiratory effort. Examination patient has dehiscence of both wounds on both feet with necrotic tissue odor and drainage.  Imaging: No results found.    Labs: Lab Results  Component Value Date   HGBA1C 6.4 (H) 08/02/2022   HGBA1C 11.0 (H) 01/11/2020   ESRSEDRATE 136 (H) 01/14/2020   ESRSEDRATE 126 (H) 01/13/2020   ESRSEDRATE 125 (H) 01/12/2020   CRP 11.3 (H) 01/14/2020   CRP 13.0 (H) 01/13/2020   CRP 15.8 (H) 01/12/2020   LABURIC 7.7 (H) 02/04/2022   REPTSTATUS PENDING 08/02/2022   CULT  08/02/2022    NO GROWTH 4 DAYS Performed at [facility], [address] [place], [place] [address]      Lab Results  Component Value Date   ALBUMIN 1.6 (L) 08/06/2022   ALBUMIN 1.7 (L) 08/05/2022   ALBUMIN 1.9 (L) 08/04/2022    Lab Results  Component Value  Date   MG 1.4 (L) 01/13/2020   No results found for: "VD25OH"  No results found for: "PREALBUMIN"    Latest Ref Rng & Units 08/06/2022    1:19 AM 08/05/2022    1:25 AM 08/04/2022    8:01 AM  CBC EXTENDED  WBC 4.0 - 10.5 K/uL 11.9  13.8  15.0   RBC 3.87 - 5.11 MIL/uL 2.32  2.59  3.01   Hemoglobin 12.0 - 15.0 g/dL 6.5  7.1  8.2   HCT 37.1 - 46.0 % 19.9  21.9  25.7   Platelets 150 - 400 K/uL 352  401  392      There is no height or weight on file to calculate BMI.  Orders:  No orders of the defined types were placed in this encounter.  Meds ordered this encounter  Medications   sulfamethoxazole-trimethoprim (BACTRIM DS) 800-160 MG tablet    Sig: Take 1 tablet by mouth 2 (two) times daily.    Dispense:  40 tablet    Refill:  0     Procedures: No procedures performed  Clinical Data: No additional findings.  ROS:  All other systems negative, except as noted in the HPI. Review of Systems  Objective: Vital Signs: There were  no vitals taken for this visit.  Specialty Comments:  No specialty comments available.  PMFS History: Patient Active Problem List   Diagnosis Date Noted   Critical limb ischemia of both lower extremities 06/19/2022   DM type 2 (diabetes mellitus, type 2) 01/28/2020   Hyperlipidemia 01/28/2020   Hypertension 01/28/2020   Hypothyroidism 01/28/2020   Obesity 01/28/2020   Ulcerated, foot, right, limited to breakdown of skin    Cellulitis of right foot    Peripheral arterial disease    Cellulitis 01/11/2020   Blister of leg 01/06/2020   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/28/2017   Chest pain 10/28/2017   GERD (gastroesophageal reflux disease) 10/28/2017   Past Medical History:  Diagnosis Date   Arthritis    Critical limb ischemia of both lower extremities    Diabetes mellitus without complication    type 2   GERD (gastroesophageal reflux disease)    Hypercholesteremia    Hypertension    Hypothyroidism    PAD (peripheral artery  disease)    PONV (postoperative nausea and vomiting)    Thyroid disease     Family History  Problem Relation Age of Onset   Diabetes Other    CAD Other     Past Surgical History:  Procedure Laterality Date   ABDOMINAL AORTOGRAM W/LOWER EXTREMITY Bilateral 01/14/2020   Procedure: ABDOMINAL AORTOGRAM W/LOWER EXTREMITY;  Surgeon: Cephus Shellinglark, Christopher J, MD;  Location: [facility];  Service: Cardiovascular;  Laterality: Bilateral;   ABDOMINAL AORTOGRAM W/LOWER EXTREMITY N/A 07/05/2022   Procedure: ABDOMINAL AORTOGRAM W/LOWER EXTREMITY;  Surgeon: Cephus Shellinglark, Christopher J, MD;  Location: [facility];  Service: Cardiovascular;  Laterality: N/A;   ABDOMINAL AORTOGRAM W/LOWER EXTREMITY N/A 08/02/2022   Procedure: ABDOMINAL AORTOGRAM W/LOWER EXTREMITY;  Surgeon: Cephus Shellinglark, Christopher J, MD;  Location: [facility];  Service: Cardiovascular;  Laterality: N/A;   AMPUTATION Bilateral 07/20/2022   Procedure: AMPUTATION RIGHT GREAT TOE AND SECOND TOE, AMPUTATION LEFT GREAT TOE;  Surgeon: Nadara Mustarduda, [place] Urieta V, MD;  Location: [facility] OR;  Service: Orthopedics;  Laterality: Bilateral;   AMPUTATION TOE     PERIPHERAL VASCULAR ATHERECTOMY Right 01/14/2020   Procedure: PERIPHERAL VASCULAR ATHERECTOMY;  Surgeon: Cephus Shellinglark, Christopher J, MD;  [facility];  Service: Cardiovascular;  Laterality: Right;  sfa   PERIPHERAL VASCULAR INTERVENTION Right 01/14/2020   Procedure: PERIPHERAL VASCULAR INTERVENTION;  Surgeon: Cephus Shellinglark, Christopher J, MD;  Location: [facility];  Service: Cardiovascular;  Laterality: Right;  sfa stent    PERIPHERAL VASCULAR INTERVENTION  07/05/2022   Procedure: PERIPHERAL VASCULAR INTERVENTION;  Surgeon: Cephus Shellinglark, Christopher J, MD;  Location: [facility];  Service: Cardiovascular;;   PERIPHERAL VASCULAR INTERVENTION  08/02/2022   Procedure: PERIPHERAL VASCULAR INTERVENTION;  Surgeon: Cephus Shellinglark, Christopher J, MD;  Location: [facility];  Service: Cardiovascular;;   PERIPHERAL  VASCULAR THROMBECTOMY  08/02/2022   Procedure: PERIPHERAL VASCULAR THROMBECTOMY;  Surgeon: Cephus Shellinglark, Christopher J, MD;  Location: [facility];  Service: Cardiovascular;;   SKIN SPLIT GRAFT Right 03/18/2020   Procedure: SKIN GRAFTING RIGHT FOOT ULCER;  Surgeon: Nadara Mustarduda, Meklit Cotta V, MD;  [facility];  Service: Orthopedics;  Laterality: Right;   WISDOM TOOTH EXTRACTION     Social History   Occupational History   Not on file  Tobacco Use   Smoking status: Former    Types: Cigarettes    Quit date: 01/2017    Years since quitting: 5.5   Smokeless tobacco: Never  Vaping Use  Vaping Use: Never used  Substance and Sexual Activity   Alcohol use: Not Currently   Drug use: Yes    Types: Marijuana    Comment: last use 03/12/20 - uses twice a month per pt 03/16/20   Sexual activity: Yes    Birth control/protection: Post-menopausal

## 2022-08-06 NOTE — Progress Notes (Signed)
I have seen and evaluated the patient this morning. Her HGB this morning came back at 6.5 so she is currently getting 1 unit of PRBCs. Her blood pressure is stable. She denies any dizziness, pain, weakness.   She has been NPO past midnight. She is still agreeable to RLE angiogram with Dr.Clark this afternoon.  Loel Dubonnet, PA-C [facility] 08/06/2022, 7:49AM

## 2022-08-06 NOTE — Progress Notes (Signed)
Date and time results received: 08/06/22 0225 (use smartphrase ".now" to insert current time)  Test: HGB  Critical Value: 6.5  Name of Provider Notified: Edilia Bo   Orders Received? Or Actions Taken?: Orders Received - See Orders for details

## 2022-08-06 NOTE — OR Nursing (Signed)
Site area- left  Site Prior to Removal- 0   Pressure Applied For-  25 MInutes   Bedrest Beginning at - 1745   Manual- Yes   Patient Status During Pull- Stable    Post Pull Groin Site- 0   Post Pull Instructions Given- Yes   Post Pull Pulses Present- Yes    Dressing Applied- Tegaderm and Gauze Dressing    Comments:  Patient tolerated well. Dressing clean dry and in tact. No hematoma.

## 2022-08-07 ENCOUNTER — Encounter (HOSPITAL_COMMUNITY): Payer: Managed Care, Other (non HMO)

## 2022-08-07 ENCOUNTER — Encounter (HOSPITAL_COMMUNITY): Payer: Self-pay | Admitting: Vascular Surgery

## 2022-08-07 DIAGNOSIS — I70223 Atherosclerosis of native arteries of extremities with rest pain, bilateral legs: Secondary | ICD-10-CM

## 2022-08-07 LAB — CULTURE, BLOOD (ROUTINE X 2)
Culture: NO GROWTH
Culture: NO GROWTH
Special Requests: ADEQUATE
Special Requests: ADEQUATE

## 2022-08-07 LAB — CBC
HCT: 24.8 % — ABNORMAL LOW (ref 36.0–46.0)
Hemoglobin: 8.2 g/dL — ABNORMAL LOW (ref 12.0–15.0)
MCH: 28.9 pg (ref 26.0–34.0)
MCHC: 33.1 g/dL (ref 30.0–36.0)
MCV: 87.3 fL (ref 80.0–100.0)
Platelets: 335 10*3/uL (ref 150–400)
RBC: 2.84 MIL/uL — ABNORMAL LOW (ref 3.87–5.11)
RDW: 15.2 % (ref 11.5–15.5)
WBC: 11.1 10*3/uL — ABNORMAL HIGH (ref 4.0–10.5)
nRBC: 0 % (ref 0.0–0.2)

## 2022-08-07 LAB — BASIC METABOLIC PANEL
Anion gap: 9 (ref 5–15)
BUN: 16 mg/dL (ref 6–20)
CO2: 19 mmol/L — ABNORMAL LOW (ref 22–32)
Calcium: 7.6 mg/dL — ABNORMAL LOW (ref 8.9–10.3)
Chloride: 106 mmol/L (ref 98–111)
Creatinine, Ser: 0.94 mg/dL (ref 0.44–1.00)
GFR, Estimated: >60 mL/min (ref >60)
Glucose, Bld: 143 mg/dL — ABNORMAL HIGH (ref 70–99)
Potassium: 3.2 mmol/L — ABNORMAL LOW (ref 3.5–5.1)
Sodium: 134 mmol/L — ABNORMAL LOW (ref 135–145)

## 2022-08-07 LAB — BPAM RBC
Blood Product Expiration Date: 202405072359
Blood Product Expiration Date: 202405072359
ISSUE DATE / TIME: 202404080611
ISSUE DATE / TIME: 202404080946
Unit Type and Rh: 5100
Unit Type and Rh: 5100

## 2022-08-07 LAB — TYPE AND SCREEN
ABO/RH(D): O POS
Unit division: 0
Unit division: 0

## 2022-08-07 LAB — GLUCOSE, CAPILLARY
Glucose-Capillary: 132 mg/dL — ABNORMAL HIGH (ref 70–99)
Glucose-Capillary: 208 mg/dL — ABNORMAL HIGH (ref 70–99)
Glucose-Capillary: 130 mg/dL — ABNORMAL HIGH (ref 70–99)
Glucose-Capillary: 155 mg/dL — ABNORMAL HIGH (ref 70–99)

## 2022-08-07 LAB — SURGICAL PCR SCREEN
MRSA, PCR: NEGATIVE
Staphylococcus aureus: NEGATIVE

## 2022-08-07 NOTE — Progress Notes (Signed)
Patient ID: Kayla Schmitt, female   DOB: 02-04-1966, 57 y.o.   MRN: [id number] Patient is a 57 year old woman who is status post revascularization of both lower extremities with Dr. Chestine Spore.  She still has persistent purulent drainage, soft tissue necrosis, and exposed bone of the forefoot bilaterally.  Will plan for bilateral transmetatarsal amputation tomorrow, Wednesday.

## 2022-08-07 NOTE — H&P (View-Only) (Signed)
Patient ID: Kayla Schmitt, female   DOB: 07/13/1965, 56 y.o.   MRN: [id number] Patient is a 56-year-old woman who is status post revascularization of both lower extremities with Dr. Clark.  She still has persistent purulent drainage, soft tissue necrosis, and exposed bone of the forefoot bilaterally.  Will plan for bilateral transmetatarsal amputation tomorrow, Wednesday.  

## 2022-08-07 NOTE — Progress Notes (Signed)
[facility] [place]  Subjective  -no complaints   Objective (!) 142/64 79 99.1 F (37.3 C) (Oral) 17 96%  Intake/Output Summary (Last 24 hours) at 08/07/2022 0827 Last data filed at 08/06/2022 2306 Gross per 24 hour  Intake 1673 ml  Output --  Net 1673 ml    Left groin looks good with no hematoma after transfemoral access Brisk bilateral PT signals  Laboratory Lab Results: Recent Labs    08/06/22 0119 08/06/22 1815 08/07/22 0238  WBC 11.9*  --  11.1*  HGB 6.5* 9.3* 8.2*  HCT 19.9* 28.0* 24.8*  PLT 352  --  335   BMET Recent Labs    08/06/22 0119 08/07/22 0238  NA 133* 134*  K 3.2* 3.2*  CL 109 106  CO2 19* 19*  GLUCOSE 178* 143*  BUN 24* 16  CREATININE 1.05* 0.94  CALCIUM 7.4* 7.6*    COAG No results found for: "INR", "PROTIME" No results found for: "PTT"  Assessment/Planning:  57 year old female admitted with critical limb ischemia with tissue loss of the bilateral lower extremities with nonhealing toe amputations.  She has undergone staged bilateral lower extremity interventions.  Yesterday she underwent right SFA/above-knee popliteal laser atherectomy with DCB.  She has brisk PT signals in both feet.  Continue aspirin Plavix statin.  Optimized from our standpoint.  Plan is bilateral TMA tomorrow with Dr. Lajoyce Corners.  Cephus Shelling 08/07/2022 8:27 AM --

## 2022-08-07 NOTE — Anesthesia Preprocedure Evaluation (Addendum)
Anesthesia Evaluation  Patient identified by MRN, date of birth, ID band Patient awake    Reviewed: Allergy & Precautions, NPO status , Patient's Chart, lab work & pertinent test results  History of Anesthesia Complications (+) PONV and history of anesthetic complications  Airway Mallampati: II  TM Distance: >3 FB Neck ROM: Full    Dental no notable dental hx.    Pulmonary former smoker   Pulmonary exam normal        Cardiovascular hypertension, Pt. on medications + Peripheral Vascular Disease  Normal cardiovascular exam     Neuro/Psych negative neurological ROS     GI/Hepatic ,GERD  Medicated,,(+)     substance abuse  marijuana use  Endo/Other  diabetes, Insulin DependentHypothyroidism    Renal/GU negative Renal ROS     Musculoskeletal  (+) Arthritis ,  Gangrene Bilateral Feet   Abdominal   Peds  Hematology  (+) Blood dyscrasia (Hgb 8.2), anemia   Anesthesia Other Findings Day of surgery medications reviewed with patient.  Reproductive/Obstetrics                              Anesthesia Physical Anesthesia Plan  ASA: 3  Anesthesia Plan: General   Post-op Pain Management: Regional block* and Tylenol PO (pre-op)*   Induction: Intravenous  PONV Risk Score and Plan: 4 or greater and Midazolam, Propofol infusion, Dexamethasone, Ondansetron, Treatment may vary due to age or medical condition and Scopolamine patch - Pre-op  Airway Management Planned: LMA  Additional Equipment: None  Intra-op Plan:   Post-operative Plan: Extubation in OR  Informed Consent: I have reviewed the patients History and Physical, chart, labs and discussed the procedure including the risks, benefits and alternatives for the proposed anesthesia with the patient or authorized representative who has indicated his/her understanding and acceptance.     Dental advisory given  Plan Discussed with:  CRNA  Anesthesia Plan Comments:         Anesthesia Quick Evaluation

## 2022-08-08 ENCOUNTER — Other Ambulatory Visit: Payer: Self-pay

## 2022-08-08 ENCOUNTER — Encounter (HOSPITAL_COMMUNITY)
Admission: AD | Disposition: A | Discharge status: Home / Self Care | Payer: Self-pay | Source: Home / Self Care | Attending: Vascular Surgery

## 2022-08-08 ENCOUNTER — Inpatient Hospital Stay (HOSPITAL_COMMUNITY): Payer: Managed Care, Other (non HMO) | Admitting: Anesthesiology

## 2022-08-08 ENCOUNTER — Encounter (HOSPITAL_COMMUNITY): Payer: Self-pay | Admitting: Vascular Surgery

## 2022-08-08 DIAGNOSIS — Z794 Long term (current) use of insulin: Secondary | ICD-10-CM | POA: Diagnosis not present

## 2022-08-08 DIAGNOSIS — E1152 Type 2 diabetes mellitus with diabetic peripheral angiopathy with gangrene: Secondary | ICD-10-CM | POA: Diagnosis not present

## 2022-08-08 DIAGNOSIS — Z87891 Personal history of nicotine dependence: Secondary | ICD-10-CM | POA: Diagnosis not present

## 2022-08-08 DIAGNOSIS — I10 Essential (primary) hypertension: Secondary | ICD-10-CM | POA: Diagnosis not present

## 2022-08-08 HISTORY — PX: AMPUTATION: SHX166

## 2022-08-08 LAB — GLUCOSE, CAPILLARY
Glucose-Capillary: 164 mg/dL — ABNORMAL HIGH (ref 70–99)
Glucose-Capillary: 111 mg/dL — ABNORMAL HIGH (ref 70–99)
Glucose-Capillary: 124 mg/dL — ABNORMAL HIGH (ref 70–99)
Glucose-Capillary: 142 mg/dL — ABNORMAL HIGH (ref 70–99)
Glucose-Capillary: 247 mg/dL — ABNORMAL HIGH (ref 70–99)
Glucose-Capillary: 275 mg/dL — ABNORMAL HIGH (ref 70–99)

## 2022-08-08 SURGERY — AMPUTATION, FOOT, PARTIAL
Anesthesia: General | Site: Foot | Laterality: Bilateral

## 2022-08-08 MED ORDER — DOCUSATE SODIUM 100 MG PO CAPS
100.0000 mg | ORAL_CAPSULE | Freq: Every day | ORAL | Status: DC
  Filled 2022-08-08 (×3): qty 1

## 2022-08-08 MED ORDER — LABETALOL HCL 5 MG/ML IV SOLN: 10.0000 mg | INTRAVENOUS | Status: DC | PRN

## 2022-08-08 MED ORDER — MAGNESIUM CITRATE PO SOLN: 1.0000 | Freq: Once | ORAL | Status: DC | PRN

## 2022-08-08 MED ORDER — BISACODYL 5 MG PO TBEC: 5.0000 mg | DELAYED_RELEASE_TABLET | Freq: Every day | ORAL | Status: DC | PRN

## 2022-08-08 MED ORDER — POLYETHYLENE GLYCOL 3350 17 G PO PACK: 17.0000 g | PACK | Freq: Every day | ORAL | Status: DC | PRN

## 2022-08-08 MED ORDER — PROPOFOL 10 MG/ML IV BOLUS
INTRAVENOUS | Status: DC | PRN
  Administered 2022-08-08: 150 mg via INTRAVENOUS

## 2022-08-08 MED ORDER — PHENOL 1.4 % MT LIQD: 1.0000 | OROMUCOSAL | Status: DC | PRN

## 2022-08-08 MED ORDER — BUPIVACAINE-EPINEPHRINE (PF) 0.5% -1:200000 IJ SOLN
INTRAMUSCULAR | Status: DC | PRN
  Administered 2022-08-08 (×2): 15 mL via PERINEURAL

## 2022-08-08 MED ORDER — POTASSIUM CHLORIDE CRYS ER 20 MEQ PO TBCR
40.0000 meq | EXTENDED_RELEASE_TABLET | Freq: Once | ORAL | Status: AC
  Administered 2022-08-08: 40 meq via ORAL
  Filled 2022-08-08: qty 2

## 2022-08-08 MED ORDER — VITAMIN C 500 MG PO TABS
1000.0000 mg | ORAL_TABLET | Freq: Every day | ORAL | Status: DC
  Administered 2022-08-08 – 2022-08-14 (×7): 1000 mg via ORAL
  Filled 2022-08-08 (×7): qty 2

## 2022-08-08 MED ORDER — HYDROMORPHONE HCL 1 MG/ML IJ SOLN: 0.5000 mg | INTRAMUSCULAR | Status: DC | PRN

## 2022-08-08 MED ORDER — SODIUM CHLORIDE 0.9 % IV SOLN: INTRAVENOUS | Status: DC

## 2022-08-08 MED ORDER — HYDRALAZINE HCL 20 MG/ML IJ SOLN: 5.0000 mg | INTRAMUSCULAR | Status: DC | PRN

## 2022-08-08 MED ORDER — POTASSIUM CHLORIDE CRYS ER 20 MEQ PO TBCR: 20.0000 meq | EXTENDED_RELEASE_TABLET | Freq: Every day | ORAL | Status: DC | PRN

## 2022-08-08 MED ORDER — POVIDONE-IODINE 10 % EX SWAB: 2.0000 | Freq: Once | CUTANEOUS | Status: DC

## 2022-08-08 MED ORDER — ALUM & MAG HYDROXIDE-SIMETH 200-200-20 MG/5ML PO SUSP: 15.0000 mL | ORAL | Status: DC | PRN

## 2022-08-08 MED ORDER — ORAL CARE MOUTH RINSE: 15.0000 mL | Freq: Once | OROMUCOSAL | Status: AC

## 2022-08-08 MED ORDER — FENTANYL CITRATE (PF) 250 MCG/5ML IJ SOLN
INTRAMUSCULAR | Status: DC | PRN
  Administered 2022-08-08 (×2): 50 ug via INTRAVENOUS

## 2022-08-08 MED ORDER — METOPROLOL TARTRATE 5 MG/5ML IV SOLN: 2.0000 mg | INTRAVENOUS | Status: DC | PRN

## 2022-08-08 MED ORDER — DEXAMETHASONE SODIUM PHOSPHATE 10 MG/ML IJ SOLN
INTRAMUSCULAR | Status: DC | PRN
  Administered 2022-08-08: 4 mg via INTRAVENOUS

## 2022-08-08 MED ORDER — CHLORHEXIDINE GLUCONATE 0.12 % MT SOLN: 15.0000 mL | Freq: Once | OROMUCOSAL | Status: AC

## 2022-08-08 MED ORDER — LIDOCAINE 2% (20 MG/ML) 5 ML SYRINGE
INTRAMUSCULAR | Status: DC | PRN
  Administered 2022-08-08: 100 mg via INTRAVENOUS

## 2022-08-08 MED ORDER — GUAIFENESIN-DM 100-10 MG/5ML PO SYRP: 15.0000 mL | ORAL_SOLUTION | ORAL | Status: DC | PRN

## 2022-08-08 MED ORDER — LACTATED RINGERS IV SOLN: INTRAVENOUS | Status: DC

## 2022-08-08 MED ORDER — KETAMINE HCL 50 MG/5ML IJ SOSY
PREFILLED_SYRINGE | INTRAMUSCULAR | Status: AC
  Filled 2022-08-08: qty 5

## 2022-08-08 MED ORDER — ZINC SULFATE 220 (50 ZN) MG PO CAPS
220.0000 mg | ORAL_CAPSULE | Freq: Every day | ORAL | Status: DC
  Administered 2022-08-08 – 2022-08-14 (×7): 220 mg via ORAL
  Filled 2022-08-08 (×7): qty 1

## 2022-08-08 MED ORDER — MIDAZOLAM HCL 2 MG/2ML IJ SOLN
INTRAMUSCULAR | Status: DC | PRN
  Administered 2022-08-08: 2 mg via INTRAVENOUS

## 2022-08-08 MED ORDER — MIDAZOLAM HCL 2 MG/2ML IJ SOLN
INTRAMUSCULAR | Status: AC
  Filled 2022-08-08: qty 2

## 2022-08-08 MED ORDER — ONDANSETRON HCL 4 MG/2ML IJ SOLN: 4.0000 mg | Freq: Four times a day (QID) | INTRAMUSCULAR | Status: DC | PRN

## 2022-08-08 MED ORDER — ACETAMINOPHEN 325 MG PO TABS
325.0000 mg | ORAL_TABLET | Freq: Four times a day (QID) | ORAL | Status: DC | PRN
  Administered 2022-08-09 (×3): 650 mg via ORAL
  Administered 2022-08-10: 325 mg via ORAL
  Administered 2022-08-10 – 2022-08-14 (×10): 650 mg via ORAL
  Filled 2022-08-08 (×14): qty 2

## 2022-08-08 MED ORDER — PANTOPRAZOLE SODIUM 40 MG PO TBEC
40.0000 mg | DELAYED_RELEASE_TABLET | Freq: Every day | ORAL | Status: DC
  Administered 2022-08-09 – 2022-08-14 (×6): 40 mg via ORAL
  Filled 2022-08-08 (×6): qty 1

## 2022-08-08 MED ORDER — JUVEN PO PACK
1.0000 | PACK | Freq: Two times a day (BID) | ORAL | Status: DC
  Administered 2022-08-08 – 2022-08-14 (×11): 1 via ORAL
  Filled 2022-08-08 (×12): qty 1

## 2022-08-08 MED ORDER — ONDANSETRON HCL 4 MG/2ML IJ SOLN
INTRAMUSCULAR | Status: DC | PRN
  Administered 2022-08-08: 4 mg via INTRAVENOUS

## 2022-08-08 MED ORDER — CHLORHEXIDINE GLUCONATE 4 % EX LIQD
60.0000 mL | Freq: Once | CUTANEOUS | Status: AC
  Administered 2022-08-08: 4 via TOPICAL
  Filled 2022-08-08: qty 60

## 2022-08-08 MED ORDER — KETAMINE HCL 10 MG/ML IJ SOLN
INTRAMUSCULAR | Status: DC | PRN
  Administered 2022-08-08: 20 mg via INTRAVENOUS

## 2022-08-08 MED ORDER — PHENYLEPHRINE 80 MCG/ML (10ML) SYRINGE FOR IV PUSH (FOR BLOOD PRESSURE SUPPORT)
PREFILLED_SYRINGE | INTRAVENOUS | Status: DC | PRN
  Administered 2022-08-08: 160 ug via INTRAVENOUS
  Administered 2022-08-08: 80 ug via INTRAVENOUS
  Administered 2022-08-08 (×2): 160 ug via INTRAVENOUS

## 2022-08-08 MED ORDER — OXYCODONE HCL 5 MG PO TABS: 10.0000 mg | ORAL_TABLET | ORAL | Status: DC | PRN

## 2022-08-08 MED ORDER — CEFAZOLIN SODIUM-DEXTROSE 2-4 GM/100ML-% IV SOLN
2.0000 g | INTRAVENOUS | Status: AC
  Administered 2022-08-08: 2 g via INTRAVENOUS
  Filled 2022-08-08 (×2): qty 100

## 2022-08-08 MED ORDER — FENTANYL CITRATE (PF) 250 MCG/5ML IJ SOLN
INTRAMUSCULAR | Status: AC
  Filled 2022-08-08: qty 5

## 2022-08-08 MED ORDER — 0.9 % SODIUM CHLORIDE (POUR BTL) OPTIME
TOPICAL | Status: DC | PRN
  Administered 2022-08-08 (×2): 1000 mL

## 2022-08-08 MED ORDER — MAGNESIUM SULFATE 2 GM/50ML IV SOLN: 2.0000 g | Freq: Every day | INTRAVENOUS | Status: DC | PRN

## 2022-08-08 MED ORDER — PROPOFOL 10 MG/ML IV BOLUS
INTRAVENOUS | Status: AC
  Filled 2022-08-08: qty 20

## 2022-08-08 MED ORDER — OXYCODONE HCL 5 MG PO TABS: 5.0000 mg | ORAL_TABLET | ORAL | Status: DC | PRN

## 2022-08-08 MED ORDER — CHLORHEXIDINE GLUCONATE 0.12 % MT SOLN
OROMUCOSAL | Status: AC
  Administered 2022-08-08: 15 mL via OROMUCOSAL
  Filled 2022-08-08: qty 15

## 2022-08-08 SURGICAL SUPPLY — 42 items
APL SKNCLS STERI-STRIP NONHPOA (GAUZE/BANDAGES/DRESSINGS)
BAG COUNTER SPONGE SURGICOUNT (BAG) ×2 IMPLANT
BAG SPNG CNTER NS LX DISP (BAG) ×1
BENZOIN TINCTURE PRP APPL 2/3 (GAUZE/BANDAGES/DRESSINGS) ×2 IMPLANT
BLADE SAW SGTL 83.5X18.5 (BLADE) IMPLANT
BLADE SAW SGTL HD 18.5X60.5X1. (BLADE) ×2 IMPLANT
BLADE SAW SGTL NAR THIN XSHT (BLADE) IMPLANT
BLADE SURG 21 STRL SS (BLADE) ×2 IMPLANT
BNDG CMPR 5X4 CHSV STRCH STRL (GAUZE/BANDAGES/DRESSINGS) ×2
BNDG COHESIVE 4X5 TAN STRL (GAUZE/BANDAGES/DRESSINGS) IMPLANT
BNDG COHESIVE 4X5 TAN STRL LF (GAUZE/BANDAGES/DRESSINGS) IMPLANT
BNDG GAUZE DERMACEA FLUFF 4 (GAUZE/BANDAGES/DRESSINGS) IMPLANT
BNDG GZE DERMACEA 4 6PLY (GAUZE/BANDAGES/DRESSINGS)
COVER SURGICAL LIGHT HANDLE (MISCELLANEOUS) ×2 IMPLANT
DRAPE INCISE IOBAN 66X45 STRL (DRAPES) ×2 IMPLANT
DRAPE U-SHAPE 47X51 STRL (DRAPES) ×2 IMPLANT
DRESSING PEEL AND PLC PRVNA 13 (GAUZE/BANDAGES/DRESSINGS) IMPLANT
DRSG ADAPTIC 3X8 NADH LF (GAUZE/BANDAGES/DRESSINGS) IMPLANT
DRSG PEEL AND PLACE PREVENA 13 (GAUZE/BANDAGES/DRESSINGS) ×2
DURAPREP 26ML APPLICATOR (WOUND CARE) ×2 IMPLANT
ELECT REM PT RETURN 9FT ADLT (ELECTROSURGICAL) ×1
ELECTRODE REM PT RTRN 9FT ADLT (ELECTROSURGICAL) ×2 IMPLANT
GAUZE PAD ABD 8X10 STRL (GAUZE/BANDAGES/DRESSINGS) IMPLANT
GAUZE SPONGE 4X4 12PLY STRL (GAUZE/BANDAGES/DRESSINGS) IMPLANT
GLOVE BIOGEL PI IND STRL 9 (GLOVE) ×2 IMPLANT
GLOVE SURG ORTHO 9.0 STRL STRW (GLOVE) ×2 IMPLANT
GOWN STRL REUS W/ TWL XL LVL3 (GOWN DISPOSABLE) ×6 IMPLANT
GOWN STRL REUS W/TWL XL LVL3 (GOWN DISPOSABLE) ×3
GRAFT SKIN WND MICRO 38 (Tissue) IMPLANT
KIT BASIN OR (CUSTOM PROCEDURE TRAY) ×2 IMPLANT
KIT DRSG PREVENA PLUS 7DAY 125 (MISCELLANEOUS) IMPLANT
KIT TURNOVER KIT B (KITS) ×2 IMPLANT
NS IRRIG 1000ML POUR BTL (IV SOLUTION) ×2 IMPLANT
PACK ORTHO EXTREMITY (CUSTOM PROCEDURE TRAY) ×2 IMPLANT
PAD ARMBOARD 7.5X6 YLW CONV (MISCELLANEOUS) ×4 IMPLANT
SPONGE T-LAP 18X18 ~~LOC~~+RFID (SPONGE) IMPLANT
SUT ETHILON 2 0 PSLX (SUTURE) ×4 IMPLANT
TOWEL GREEN STERILE (TOWEL DISPOSABLE) ×2 IMPLANT
TOWEL GREEN STERILE FF (TOWEL DISPOSABLE) ×2 IMPLANT
TUBE CONNECTING 12X1/4 (SUCTIONS) ×2 IMPLANT
WATER STERILE IRR 1000ML POUR (IV SOLUTION) ×2 IMPLANT
YANKAUER SUCT BULB TIP NO VENT (SUCTIONS) ×2 IMPLANT

## 2022-08-08 NOTE — Anesthesia Procedure Notes (Signed)
Anesthesia Regional Block: Popliteal block   Pre-Anesthetic Checklist: , timeout performed,  Correct Patient, Correct Site, Correct Laterality,  Correct Procedure, Correct Position, site marked,  Risks and benefits discussed,  Pre-op evaluation,  At surgeon's request and post-op pain management  Laterality: Right  Prep: Maximum Sterile Barrier Precautions used, chloraprep       Needles:  Injection technique: Single-shot  Needle Type: Echogenic Stimulator Needle     Needle Length: 9cm  Needle Gauge: 22     Additional Needles:   Procedures:,,,, ultrasound used (permanent image in chart),,    Narrative:  Start time: 08/08/2022 9:26 AM End time: 08/08/2022 9:28 AM Injection made incrementally with aspirations every 5 mL.  Performed by: Personally  Anesthesiologist: Kaylyn Layer, MD  Additional Notes: Risks, benefits, and alternative discussed. Patient gave consent for procedure. Patient prepped and draped in sterile fashion. Sedation administered, patient remains easily responsive to voice. Relevant anatomy identified with ultrasound guidance. Local anesthetic given in 5cc increments with no signs or symptoms of intravascular injection. No pain or paraesthesias with injection. Patient monitored throughout procedure with signs of LAST or immediate complications. Tolerated well. Ultrasound image placed in chart.  Amalia Greenhouse, MD

## 2022-08-08 NOTE — Transfer of Care (Signed)
Immediate Anesthesia Transfer of Care Note  Patient: Kayla Schmitt  Procedure(s) Performed: BILATERAL TRANSMETATARSAL AMPUTATION (Bilateral: Foot)  Patient Location: PACU  Anesthesia Type:GA combined with regional for post-op pain  Level of Consciousness: awake and patient cooperative  Airway & Oxygen Therapy: Patient Spontanous Breathing and Patient connected to nasal cannula oxygen  Post-op Assessment: Report given to RN and Post -op Vital signs reviewed and stable  Post vital signs: Reviewed and stable  Last Vitals:  Vitals Value Taken Time  BP 107/89 08/08/22 1030  Temp    Pulse 83 08/08/22 1032  Resp 17 08/08/22 1032  SpO2 92 % 08/08/22 1032  Vitals shown include unvalidated device data.  Last Pain:  Vitals:   08/08/22 0905  TempSrc: Oral  PainSc: 0-No pain      Patients Stated Pain Goal: 2 (08/02/22 1315)  Complications: No notable events documented.

## 2022-08-08 NOTE — Op Note (Signed)
08/02/2022 - 08/08/2022  10:16 AM  PATIENT:  Kayla Schmitt    PRE-OPERATIVE DIAGNOSIS:  Gangrene Bilateral Feet  POST-OPERATIVE DIAGNOSIS:  Same  PROCEDURE:  BILATERAL TRANSMETATARSAL AMPUTATION Application of Kerecis micro graft 38 cm x 2. Application of 13 cm Prevena wound VAC x 2.  SURGEON:  Nadara Mustard, MD  PHYSICIAN ASSISTANT:None ANESTHESIA:   General  PREOPERATIVE INDICATIONS:  [name] is a  57 y.o. female with a diagnosis of Gangrene Bilateral Feet who failed conservative measures and elected for surgical management.    The risks benefits and alternatives were discussed with the patient preoperatively including but not limited to the risks of infection, bleeding, nerve injury, cardiopulmonary complications, the need for revision surgery, among others, and the patient was willing to proceed.  OPERATIVE IMPLANTS:   Implant Name Type Inv. Item Serial No. Manufacturer Lot No. LRB No. Used Action  GRAFT SKIN WND MICRO 38 - [id number] Tissue GRAFT SKIN WND MICRO 38  [employer] [id number] Bilateral 1 Implanted  GRAFT SKIN WND MICRO 38 - [id number] Tissue GRAFT SKIN WND MICRO 38  [employer] [id number] Bilateral 1 Implanted    @ENCIMAGES @  OPERATIVE FINDINGS: Tissue margins were clear good petechial bleeding.  OPERATIVE PROCEDURE: Patient was brought the operating room underwent a general anesthetic.  After adequate levels anesthesia were obtained both lower extremities were prepped using DuraPrep draped into a sterile field a timeout was called.  Attention was first focused on the right lower extremity.  A fishmouth incision was made proximal to the gangrenous necrotic tissue.  Oscillating saw was used to perform a transmetatarsal amputation.  Electrocautery was used hemostasis.  The wound was irrigated with normal saline.  The wound edges were healthy and viable.  The wound was filled with 38 cm of Kerecis micro graft.  The incision closed using 2-0  nylon and a 13 cm Prevena wound VAC applied.  Attention was then focused on the left lower extremity.  A fishmouth incision was made proximal to the gangrenous necrotic tissue.  A oscillating saw was used to perform a transmetatarsal amputation.  Electrocautery was used hemostasis.  The wound margins were trimmed back to healthy viable bleeding tissue.  The wound was filled with Kerecis micro graft 38 cm incision closed using 2-0 nylon.  A 13 cm Prevena wound VAC was applied this had a good suction fit.  Patient was extubated taken the PACU in stable condition.   DISCHARGE PLANNING:  Antibiotic duration: Continue antibiotics for 24 hours  Weightbearing: Nonweightbearing on both lower extremities  Pain medication: Opioid pathway  Dressing care/ Wound VAC: Continue wound VAC for 1 week  Ambulatory devices: Transfers only  Discharge to: Anticipate discharge to skilled nursing.  Follow-up: In the office 1 week post operative.

## 2022-08-08 NOTE — Progress Notes (Signed)
  Progress Note    08/08/2022 8:12 AM 2 Days Post-Op  Subjective:  no complaints    Vitals:   08/08/22 0337 08/08/22 0735  BP: (!) 111/52 139/70  Pulse: 80 69  Resp: 16 19  Temp: 98.4 F (36.9 C) 98.9 F (37.2 C)  SpO2: 92% 98%    Physical Exam: Cardiac:  regular Lungs:  nonlabored Incisions:  L groin cath site intact without hematoma Extremities:  brisk PT doppler signals bilaterally. Gangrene of bilateral feet   CBC    Component Value Date/Time   WBC 11.1 (H) 08/07/2022 0238   RBC 2.84 (L) 08/07/2022 0238   HGB 8.2 (L) 08/07/2022 0238   HCT 24.8 (L) 08/07/2022 0238   PLT 335 08/07/2022 0238   MCV 87.3 08/07/2022 0238   MCH 28.9 08/07/2022 0238   MCHC 33.1 08/07/2022 0238   RDW 15.2 08/07/2022 0238   LYMPHSABS 2.3 02/04/2022 1721   MONOABS 0.6 02/04/2022 1721   EOSABS 0.2 02/04/2022 1721   BASOSABS 0.0 02/04/2022 1721    BMET    Component Value Date/Time   NA 134 (L) 08/07/2022 0238   K 3.2 (L) 08/07/2022 0238   CL 106 08/07/2022 0238   CO2 19 (L) 08/07/2022 0238   GLUCOSE 143 (H) 08/07/2022 0238   BUN 16 08/07/2022 0238   CREATININE 0.94 08/07/2022 0238   CALCIUM 7.6 (L) 08/07/2022 0238   GFRNONAA >60 08/07/2022 0238   GFRAA >60 01/15/2020 0836    INR No results found for: "INR"   Intake/Output Summary (Last 24 hours) at 08/08/2022 6950 Last data filed at 08/07/2022 1326 Gross per 24 hour  Intake 180 ml  Output --  Net 180 ml      Assessment/Plan:  57 y.o. female is s/p:  4/4- Left SFA and above knee popliteal artery mechanical thrombectomy, with angioplasty and stenting of left above knee popliteal artery and SFA  4/8- Right SFA and above knee popliteal artery laser atherectomy, with DCBA of the right above knee popliteal artery and SFA   -The patient is vascularly optimized after staged BLE interventions with Dr. Chestine Spore. She has brisk PT doppler signals bilaterally -She will be getting bilateral TMAs by Dr.Duda today -Continue  aspirin, plavix, and statin  Loel Dubonnet, PA-C [facility] [phone] 08/08/2022 8:12 AM

## 2022-08-08 NOTE — Interval H&P Note (Signed)
History and Physical Interval Note:  08/08/2022 8:22 AM  Kayla Schmitt  has presented today for surgery, with the diagnosis of Gangrene Bilateral Feet.  The various methods of treatment have been discussed with the patient and family. After consideration of risks, benefits and other options for treatment, the patient has consented to  Procedure(s): BILATERAL TRANSMETATARSAL AMPUTATION (Bilateral) as a surgical intervention.  The patient's history has been reviewed, patient examined, no change in status, stable for surgery.  I have reviewed the patient's chart and labs.  Questions were answered to the patient's satisfaction.     Nadara Mustard

## 2022-08-08 NOTE — Anesthesia Procedure Notes (Signed)
Procedure Name: LMA Insertion Date/Time: 08/08/2022 9:42 AM  Performed by: Adria Dill, CRNAPre-anesthesia Checklist: Patient identified, Emergency Drugs available, Suction available and Patient being monitored Patient Re-evaluated:Patient Re-evaluated prior to induction Oxygen Delivery Method: Circle system utilized Preoxygenation: Pre-oxygenation with 100% oxygen Induction Type: IV induction Ventilation: Mask ventilation without difficulty LMA: LMA inserted LMA Size: 4.0 Number of attempts: 1 Placement Confirmation: positive ETCO2 and breath sounds checked- equal and bilateral Tube secured with: Tape Dental Injury: Teeth and Oropharynx as per pre-operative assessment

## 2022-08-08 NOTE — Anesthesia Postprocedure Evaluation (Signed)
Anesthesia Post Note  Patient: Kayla Schmitt  Procedure(s) Performed: BILATERAL TRANSMETATARSAL AMPUTATION (Bilateral: Foot)     Patient location during evaluation: PACU Anesthesia Type: General Level of consciousness: awake and alert Pain management: pain level controlled Vital Signs Assessment: post-procedure vital signs reviewed and stable Respiratory status: spontaneous breathing, nonlabored ventilation and respiratory function stable Cardiovascular status: blood pressure returned to baseline Postop Assessment: no apparent nausea or vomiting Anesthetic complications: no   No notable events documented.  Last Vitals:  Vitals:   08/08/22 1030 08/08/22 1045  BP: 107/89 124/64  Pulse: 82 77  Resp: 20 16  Temp: 36.6 C   SpO2: 93% 93%    Last Pain:  Vitals:   08/08/22 0905  TempSrc: Oral  PainSc: 0-No pain                 Shanda Howells

## 2022-08-08 NOTE — Progress Notes (Signed)
Pharmacy Antibiotic Note  Kayla Schmitt is a 57 y.o. female admitted on 08/02/2022 with wound infection.  Pharmacy has been consulted for vancomycin dosing.  Afebrile with wbc 11.1 trending down, SCr improved to 0.94. Patient is now s/p R SFA/above-knee popliteal laser atherectomy with [facility] 4/8 B transmetatarsal amputation Wed 4/10 .   On 4/7: Vanc peak = 31. Vanc trough = 23. Confirmed levels drawn appropriately. Current eAUC with current vancomycin regimen 676.3 supratherapeutic. Dose adjusted to 1g q24h to achieve therapeutic levels.   Plan: Continue vancomycin 1000 mg IV q24h (eAUC 412, SCr 0.94) - if tx continues, will plan to recheck levels on 4/11 Continue cefepime 2g IV Q8H Continue metronidazole 500mg  IV Q12H Monitor renal function, culture results, clinical status and resolution of s/sx infection Narrow abx as able and f/u with appropriate duration  Height: 5\' 4"  (162.6 cm) Weight: 91.8 kg (202 lb 6.1 oz) IBW/kg (Calculated) : 54.7  Temp (24hrs), Avg:98.5 F (36.9 C), Min:97.9 F (36.6 C), Max:98.9 F (37.2 C)  Recent Labs  Lab 08/03/22 0056 08/04/22 0801 08/05/22 0125 08/05/22 0820 08/06/22 0119 08/07/22 0238  WBC 14.0* 15.0* 13.8*  --  11.9* 11.1*  CREATININE 0.95 0.99 1.05*  --  1.05* 0.94  VANCOTROUGH  --   --   --  23*  --   --   VANCOPEAK  --   --  31  --   --   --      Estimated Creatinine Clearance: 74.2 mL/min (by C-G formula based on SCr of 0.94 mg/dL).    Allergies  Allergen Reactions   Doxycycline Nausea And Vomiting    Extremely N/V   Trental [Pentoxifylline] Nausea And Vomiting   Penicillins Rash    Tolerated cephalosporins 07/2022     Antimicrobials this admission: Vancomycin 4/4 >> Cefepime 4/4 >> Metronidazole 4/4 >>  Microbiology 4/4 Bcx x2: NGF  Thank you for allowing pharmacy to be a part of this patient's care.  Loralee Pacas, PharmD, BCPS 08/08/2022 11:16 AM

## 2022-08-08 NOTE — Anesthesia Procedure Notes (Signed)
Anesthesia Regional Block: Popliteal block   Pre-Anesthetic Checklist: , timeout performed,  Correct Patient, Correct Site, Correct Laterality,  Correct Procedure, Correct Position, site marked,  Risks and benefits discussed,  Pre-op evaluation,  At surgeon's request and post-op pain management  Laterality: Left  Prep: Maximum Sterile Barrier Precautions used, chloraprep       Needles:  Injection technique: Single-shot  Needle Type: Echogenic Stimulator Needle     Needle Length: 9cm  Needle Gauge: 22     Additional Needles:   Procedures:,,,, ultrasound used (permanent image in chart),,    Narrative:  Start time: 08/08/2022 9:24 AM End time: 08/08/2022 9:26 AM Injection made incrementally with aspirations every 5 mL.  Performed by: Personally  Anesthesiologist: Kaylyn Layer, MD  Additional Notes: Risks, benefits, and alternative discussed. Patient gave consent for procedure. Patient prepped and draped in sterile fashion. Sedation administered, patient remains easily responsive to voice. Relevant anatomy identified with ultrasound guidance. Local anesthetic given in 5cc increments with no signs or symptoms of intravascular injection. No pain or paraesthesias with injection. Patient monitored throughout procedure with signs of LAST or immediate complications. Tolerated well. Ultrasound image placed in chart.  Amalia Greenhouse, MD

## 2022-08-09 LAB — GLUCOSE, CAPILLARY
Glucose-Capillary: 206 mg/dL — ABNORMAL HIGH (ref 70–99)
Glucose-Capillary: 142 mg/dL — ABNORMAL HIGH (ref 70–99)
Glucose-Capillary: 169 mg/dL — ABNORMAL HIGH (ref 70–99)
Glucose-Capillary: 150 mg/dL — ABNORMAL HIGH (ref 70–99)

## 2022-08-09 LAB — BASIC METABOLIC PANEL
Anion gap: 9 (ref 5–15)
BUN: 21 mg/dL — ABNORMAL HIGH (ref 6–20)
CO2: 18 mmol/L — ABNORMAL LOW (ref 22–32)
Calcium: 7.7 mg/dL — ABNORMAL LOW (ref 8.9–10.3)
Chloride: 106 mmol/L (ref 98–111)
Creatinine, Ser: 0.96 mg/dL (ref 0.44–1.00)
GFR, Estimated: >60 mL/min (ref >60)
Glucose, Bld: 311 mg/dL — ABNORMAL HIGH (ref 70–99)
Potassium: 3.9 mmol/L (ref 3.5–5.1)
Sodium: 133 mmol/L — ABNORMAL LOW (ref 135–145)

## 2022-08-09 NOTE — Evaluation (Signed)
Physical Therapy Evaluation Patient Details Name: Kayla Schmitt MRN: [id number] DOB: [birth year] Today's Date: 08/09/2022  History of Present Illness  57 yo female admitted 4/4 for aortogram with Lt SFA thrombectomy and angioplasty due to bil non healing toe amputations (performed 07/20/22). 4/8 Rt SFA atherectomy and angioplasty. 4/10 bil transmet amp. PMHx: IDDM, GERD, HTN, PAD  Clinical Impression  Pt very pleasant and moving well. Pt educated for bil LE VACs and plugged them into wall and applied straps during session. Pt with assist of mom and friend at home as needed with discussion for WC transport upstairs vs ramp installation. Pt reports she can move about in home in [facility] other than bathroom and will need drop arm BSC for mobility as well as WC. Pt encouraged to continue OOB transfers with nursing staff. Pt with decreased mobility and function who will benefit from acute therapy to maximize independence prior to D/C.         Recommendations for follow up therapy are one component of a multi-disciplinary discharge planning process, led by the attending physician.  Recommendations may be updated based on patient status, additional functional criteria and insurance authorization.  Follow Up Recommendations       Assistance Recommended at Discharge Intermittent Supervision/Assistance  Patient can return home with the following  A little help with walking and/or transfers;A little help with bathing/dressing/bathroom;Assistance with cooking/housework;Assist for transportation;Help with stairs or ramp for entrance    Equipment Recommendations Wheelchair (measurements PT);Wheelchair cushion (measurements PT);BSC/3in1;Other (comment) (drop arm BSC, sliding board)  Recommendations for Other Services       Functional Status Assessment Patient has had a recent decline in their functional status and demonstrates the ability to make significant improvements in function in a reasonable and  predictable amount of time.     Precautions / Restrictions Precautions Precautions: Other (comment) Precaution Comments: bil LE VAC Restrictions RLE Weight Bearing: Non weight bearing LLE Weight Bearing: Non weight bearing      Mobility  Bed Mobility Overal bed mobility: Modified Independent             General bed mobility comments: pt able to transition to sitting without assist    Transfers Overall transfer level: Needs assistance   Transfers: Bed to chair/wheelchair/BSC            Lateral/Scoot Transfers: Min guard General transfer comment: pt transferred bed<>chair and to chair total of 3 trials with drop arm recliner and cues for maintaining NWB bil LE and sequence. pt able to transfer toward slightly elevated surface and cued for arm rest pushups in chair to straighten linens. Demonstrated A/P transfers for [facility] use as needed    Ambulation/Gait               General Gait Details: N/A  Stairs            Wheelchair Mobility    Modified Rankin (Stroke Patients Only)       Balance Overall balance assessment: No apparent balance deficits (not formally assessed)                                           Pertinent Vitals/Pain Pain Assessment Pain Assessment: No/denies pain    Home Living Family/patient expects to be discharged to:: Private residence Living Arrangements: Alone Available Help at Discharge: Family;Available PRN/intermittently Type of Home: House Home Access: Stairs to enter  Entrance Stairs-Number of Steps: 3   Home Layout: One level Home Equipment: None      Prior Function Prior Level of Function : Independent/Modified Independent                     Hand Dominance        Extremity/Trunk Assessment   Upper Extremity Assessment Upper Extremity Assessment: Overall WFL for tasks assessed    Lower Extremity Assessment Lower Extremity Assessment: Overall WFL for tasks assessed     Cervical / Trunk Assessment Cervical / Trunk Assessment: Normal  Communication   Communication: No difficulties  Cognition Arousal/Alertness: Awake/alert Behavior During Therapy: WFL for tasks assessed/performed Overall Cognitive Status: Within Functional Limits for tasks assessed                                          General Comments      Exercises     Assessment/Plan    PT Assessment Patient needs continued PT services  PT Problem List Decreased mobility;Decreased activity tolerance       PT Treatment Interventions DME instruction;Therapeutic activities;Functional mobility training;Patient/family education;Therapeutic exercise    PT Goals (Current goals can be found in the Care Plan section)  Acute Rehab PT Goals Patient Stated Goal: return home PT Goal Formulation: With patient Time For Goal Achievement: 08/23/22 Potential to Achieve Goals: Good    Frequency Min 1X/week     Co-evaluation               AM-PAC PT "6 Clicks" Mobility  Outcome Measure Help needed turning from your back to your side while in a flat bed without using bedrails?: None Help needed moving from lying on your back to sitting on the side of a flat bed without using bedrails?: A Little Help needed moving to and from a bed to a chair (including a wheelchair)?: A Little Help needed standing up from a chair using your arms (e.g., wheelchair or bedside chair)?: A Little Help needed to walk in hospital room?: Total Help needed climbing 3-5 steps with a railing? : Total 6 Click Score: 15    End of Session   Activity Tolerance: Patient tolerated treatment well Patient left: in chair;with call bell/phone within reach;with chair alarm set Nurse Communication: Mobility status (sequence for lateral and A/P transfers) PT Visit Diagnosis: Other abnormalities of gait and mobility (R26.89)    Time: 6294-7654 PT Time Calculation (min) (ACUTE ONLY): 19 min   Charges:   PT  Evaluation $PT Eval Moderate Complexity: 1 Mod          Clem Wisenbaker P, PT Acute Rehabilitation Services Office: [phone]   Enedina Finner [place] 08/09/2022, 9:29 AM

## 2022-08-09 NOTE — Progress Notes (Signed)
   Durable Medical Equipment (From admission, onward)        Start     Ordered  08/09/22 1113  For home use only DME Bedside commode  Once      Comments: Drop arm Question:  Patient needs a bedside commode to treat with the following condition  Answer:  Status post transmetatarsal amputation of foot  08/09/22 1112  08/09/22 1032  For home use only DME Other see comment  Once      Comments: 1) drop arm beside commode 2) sliding board for wheelchair to bed transfers Question:  Length of Need  Answer:  6 Months  08/09/22 1032  08/09/22 1030  For home use only DME standard manual wheelchair with seat cushion  Once      Comments: Patient suffers from bilateral transmetatarsal amputation which impairs their ability to perform daily activities like bathing, dressing, grooming, and toileting in the home.  A cane, crutch, or walker will not resolve issue with performing activities of daily living. A wheelchair will allow patient to safely perform daily activities. Patient can safely propel the wheelchair in the home or has a caregiver who can provide assistance. Length of need 6 months . Accessories: elevating leg rests (ELRs), wheel locks, extensions and anti-tippers.  08/09/22 1032

## 2022-08-09 NOTE — Progress Notes (Signed)
Patient ID: Kayla Schmitt, female   DOB: [birth year], 57 y.o.   MRN: [id number] Patient is postoperative day 1 bilateral transmetatarsal amputations.  The wound vacs are functioning well no drainage.  Patient may discharge when safe with therapy.  Ideally nonweightbearing but from a practical standpoint patient would need to be weightbearing as tolerated on both heels for transfers.

## 2022-08-09 NOTE — Progress Notes (Addendum)
  Progress Note    08/09/2022 8:10 AM 1 Day Post-Op  Subjective:  feeling good this morning. No pain    Vitals:   08/08/22 2330 08/09/22 0341  BP: (!) 133/57 131/68  Pulse: 76 80  Resp: 18 18  Temp: 98.9 F (37.2 C) 98.8 F (37.1 C)  SpO2: 97% 98%    Physical Exam: Cardiac:  regular Lungs:  nolabored Incisions:  bilateral TMAs dressed with prevena vac and coban Extremities:  brisk PT signals in the calves bilaterally, cannot access the ankle currently  CBC    Component Value Date/Time   WBC 11.1 (H) 08/07/2022 0238   RBC 2.84 (L) 08/07/2022 0238   HGB 8.2 (L) 08/07/2022 0238   HCT 24.8 (L) 08/07/2022 0238   PLT 335 08/07/2022 0238   MCV 87.3 08/07/2022 0238   MCH 28.9 08/07/2022 0238   MCHC 33.1 08/07/2022 0238   RDW 15.2 08/07/2022 0238   LYMPHSABS 2.3 02/04/2022 1721   MONOABS 0.6 02/04/2022 1721   EOSABS 0.2 02/04/2022 1721   BASOSABS 0.0 02/04/2022 1721    BMET    Component Value Date/Time   NA 133 (L) 08/09/2022 0137   K 3.9 08/09/2022 0137   CL 106 08/09/2022 0137   CO2 18 (L) 08/09/2022 0137   GLUCOSE 311 (H) 08/09/2022 0137   BUN 21 (H) 08/09/2022 0137   CREATININE 0.96 08/09/2022 0137   CALCIUM 7.7 (L) 08/09/2022 0137   GFRNONAA >60 08/09/2022 0137   GFRAA >60 01/15/2020 0836    INR No results found for: "INR"   Intake/Output Summary (Last 24 hours) at 08/09/2022 0810 Last data filed at 08/09/2022 0343 Gross per 24 hour  Intake 900 ml  Output 500 ml  Net 400 ml      Assessment/Plan:  57 y.o. female is s/p:    4/4- Left SFA and above knee popliteal artery mechanical thrombectomy, with angioplasty and stenting of left above knee popliteal artery and SFA   4/8- Right SFA and above knee popliteal artery laser atherectomy, with DCBA of the right above knee popliteal artery and SFA  4/10- bilateral TMAs with Dr.Duda   -She has brisk PT signals to the distal calf. Unable to access the ankles currently due to foot dressings post  op -Pain is well controlled. She successfully underwent  bilateral TMAs with Prevena vac by Dr.Duda yesterday -Continue ASA, plavix, and statin -Appreciate PT recommendations for this patient. Hopeful for discharge in the next few days. We will arrange a follow up with our office in 4-6 wks with ABIs and arterial duplex. -Updated PT recommendations include Home health PT with drop arm BSC, sliding board, and wheelchair. Will place home health and DME orders  Loel Dubonnet, [place] [facility] [phone] 08/09/2022 8:10 AM  I have seen and evaluated the patient. I agree with the PA note as documented above.  Patient underwent bilateral lower extremity revascularization this admission and then bilateral TMA's yesterday with Dr. Lajoyce Corners.  Both feet are wrapped with vacs with a good seal.  Working toward discharge plan with PT and home health.  She thinks she can probably go home Saturday.  Aspirin statin Plavix.  Cephus Shelling, MD [facility] [place] Office: [phone]

## 2022-08-09 NOTE — TOC Initial Note (Signed)
Transition of Care (TOC) - Initial/Assessment Note  Donn Pierini RN, BSN Transitions of Care Unit 4E- RN Case Manager See Treatment Team for direct phone #   Patient Details  Name: Kayla Schmitt MRN: [id number] Date of Birth: [birth year]  Transition of Care [facility]) CM/SW Contact:    Darrold Span, RN Phone Number: 08/09/2022, 4:58 PM  Clinical Narrative:                 CM spoke with pt at bedside for transition needs. Discussed DME and HH needs. List provided for [facility] choice Per CMS guidelines from PhoneFinancing.pl website with star ratings (copy placed in shadow chart)-Pt voiced she does not have a preference for [facility] or DME providers.   Family is looking for some of the DME needs- pt request to be contacted by [employer] regarding cost to make a decision on what DME to get under insurance.  [employer] contacted by CM for DME needs- and will reach out to pt.   CM will follow up for [facility] needs, pt voiced she would not be able to do outpt if [facility] can not be secured. Pt also shared that she will not have support at home until Saturday and requesting discharge be delayed until then to make sure she has things arranged and in place for safe discharge.   Expected Discharge Plan: Home w Home Health Services Barriers to Discharge: Continued Medical Work up   Patient Goals and CMS Choice Patient states their goals for this hospitalization and ongoing recovery are:: return home CMS Medicare.gov Compare Post Acute Care list provided to:: Patient Choice offered to / list presented to : Patient      Expected Discharge Plan and Services   Discharge Planning Services: CM Consult Post Acute Care Choice: Durable Medical Equipment, Home Health Living arrangements for the past 2 months: Single Family Home                 DME Arranged: Bedside commode, Wheelchair manual, Other see comment DME Agency: [employer] Date DME Agency Contacted: 08/09/22 Time DME Agency Contacted:  [phone] Representative spoke with at DME Agency: Belenda Cruise HH Arranged: PT          Prior Living Arrangements/Services Living arrangements for the past 2 months: Single Family Home Lives with:: Self, Relatives Patient language and need for interpreter reviewed:: Yes Do you feel safe going back to the place where you live?: Yes      Need for Family Participation in Patient Care: Yes (Comment) Care giver support system in place?: Yes (comment)   Criminal Activity/Legal Involvement Pertinent to Current Situation/Hospitalization: No - Comment as needed  Activities of Daily Living Home Assistive Devices/Equipment: Walker (specify type), Dentures (specify type), Eyeglasses, CBG Meter ADL Screening (condition at time of admission) Patient's cognitive ability adequate to safely complete daily activities?: Yes Is the patient deaf or have difficulty hearing?: No Does the patient have difficulty seeing, even when wearing glasses/contacts?: No Does the patient have difficulty concentrating, remembering, or making decisions?: No Patient able to express need for assistance with ADLs?: Yes Does the patient have difficulty dressing or bathing?: No Independently performs ADLs?: Yes (appropriate for developmental age) Does the patient have difficulty walking or climbing stairs?: No Weakness of Legs: Both Weakness of Arms/Hands: None  Permission Sought/Granted   Permission granted to share information with : Yes, Verbal Permission Granted     Permission granted to share info w AGENCY: HH/DME        Emotional Assessment Appearance::  Appears stated age Attitude/Demeanor/Rapport: Engaged Affect (typically observed): Accepting, Appropriate Orientation: : Oriented to Self, Oriented to Place, Oriented to  Time, Oriented to Situation Alcohol / Substance Use: Not Applicable Psych Involvement: No (comment)  Admission diagnosis:  Critical limb ischemia of both lower extremities [I70.223] Patient  Active Problem List   Diagnosis Date Noted   Critical limb ischemia of both lower extremities 06/19/2022   DM type 2 (diabetes mellitus, type 2) 01/28/2020   Hyperlipidemia 01/28/2020   Hypertension 01/28/2020   Hypothyroidism 01/28/2020   Obesity 01/28/2020   Ulcerated, foot, right, limited to breakdown of skin    Cellulitis of right foot    Peripheral arterial disease    Cellulitis 01/11/2020   Blister of leg 01/06/2020   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 - [place], [place] - [address] [place] [address] Phone: [phone] Fax: [phone]     Social Determinants of Health (SDOH) Social History: SDOH Screenings   Food Insecurity: No Food Insecurity (08/03/2022)  Housing: Low Risk  (08/03/2022)  Transportation Needs: No Transportation Needs (08/03/2022)  Utilities: Not At Risk (08/03/2022)  Tobacco Use: Medium Risk (08/08/2022)   SDOH Interventions:     Readmission Risk Interventions     No data to display

## 2022-08-10 LAB — GLUCOSE, CAPILLARY
Glucose-Capillary: 141 mg/dL — ABNORMAL HIGH (ref 70–99)
Glucose-Capillary: 167 mg/dL — ABNORMAL HIGH (ref 70–99)
Glucose-Capillary: 126 mg/dL — ABNORMAL HIGH (ref 70–99)
Glucose-Capillary: 107 mg/dL — ABNORMAL HIGH (ref 70–99)

## 2022-08-10 NOTE — TOC Progression Note (Signed)
Transition of Care (TOC) - Progression Note  Donn Pierini RN, BSN Transitions of Care Unit 4E- RN Case Manager See Treatment Team for direct phone #   Patient Details  Name: Kayla Schmitt MRN: [id number] Date of Birth: [birth year]  Transition of Care [facility]) CM/SW Contact  Zenda Alpers, Lenn Sink, RN Phone Number: 08/10/2022, 12:31 PM  Clinical Narrative:    Cm reached out to [facility] liaison- for HHPT referral- Bjorn Loser confirmed they are in-network with [facility] and can accept for [facility] needs.   Cm spoke with pt and mom at bedside who confirmed they have gotten a w/c and slide board for home use, and drop arm BSC has been delivered by [employer] to room.  Informed pt about HHPT arrangements- however pt now voicing that she feels she is unsafe to return home with mom and wants to look at rehab options. PT has been in to see pt this am and will update recommendations. Explained to pt and mom that TOC can not guarantee bed offers or insurance approval- but will fax out to see availability and can submit if bed offer found to see if insurance will approve.  Pt voiced she is ok with any SNF near the [place] area- mentions the [facility] or [place].   Will have CSW follow up for SNF work up.   [facility] notified to be on standby.    Expected Discharge Plan: Home w Home Health Services Barriers to Discharge: Continued Medical Work up  Expected Discharge Plan and Services   Discharge Planning Services: CM Consult Post Acute Care Choice: Durable Medical Equipment, Home Health Living arrangements for the past 2 months: Single Family Home                 DME Arranged: Bedside commode, Wheelchair manual, Other see comment DME Agency: [employer] Date DME Agency Contacted: 08/09/22 Time DME Agency Contacted: [phone] Representative spoke with at DME Agency: Belenda Cruise HH Arranged: PT HH Agency: [facility] Date [facility] Agency Contacted: 08/10/22 Time HH Agency Contacted: 1154 Representative spoke  with at [facility] Agency: Bjorn Loser   Social Determinants of Health (SDOH) Interventions SDOH Screenings   Food Insecurity: No Food Insecurity (08/03/2022)  Housing: Low Risk  (08/03/2022)  Transportation Needs: No Transportation Needs (08/03/2022)  Utilities: Not At Risk (08/03/2022)  Tobacco Use: Medium Risk (08/08/2022)    Readmission Risk Interventions     No data to display

## 2022-08-10 NOTE — Progress Notes (Signed)
Patient ID: Kayla Schmitt, female   DOB: 10-15-1965, 57 y.o.   MRN: [id number] Patient is status post revascularization bilateral lower extremities with a bilateral midfoot amputation.  Wound vacs are functioning well no leakage.  There is a small amount of drainage on the Coban wrap.  Anticipate patient will go to short-term skilled nursing placement.

## 2022-08-10 NOTE — Progress Notes (Addendum)
  Progress Note    08/10/2022 6:42 AM 2 Days Post-Op  Subjective:  says she feels like she needs CIR or rehab prior to going home since her mother will be helping her and she is 43.    Tm 99.3 HR 70's-80's  110's-130's systolic 97% RA  Vitals:   08/09/22 2344 08/10/22 0355  BP: (!) 112/55 132/66  Pulse: 80 84  Resp: 18 18  Temp: 98.7 F (37.1 C) 99.3 F (37.4 C)  SpO2: 96% 97%    Physical Exam: General:  no distress Cardiac:  regular Lungs:  non labored Incisions:  bilateral groins are soft without hematoma Extremities:  brisk bilateral peroneal doppler flow; wound vac bilateral feet in tact.  Abdomen:  soft  CBC    Component Value Date/Time   WBC 11.1 (H) 08/07/2022 0238   RBC 2.84 (L) 08/07/2022 0238   HGB 8.2 (L) 08/07/2022 0238   HCT 24.8 (L) 08/07/2022 0238   PLT 335 08/07/2022 0238   MCV 87.3 08/07/2022 0238   MCH 28.9 08/07/2022 0238   MCHC 33.1 08/07/2022 0238   RDW 15.2 08/07/2022 0238   LYMPHSABS 2.3 02/04/2022 1721   MONOABS 0.6 02/04/2022 1721   EOSABS 0.2 02/04/2022 1721   BASOSABS 0.0 02/04/2022 1721    BMET    Component Value Date/Time   NA 133 (L) 08/09/2022 0137   K 3.9 08/09/2022 0137   CL 106 08/09/2022 0137   CO2 18 (L) 08/09/2022 0137   GLUCOSE 311 (H) 08/09/2022 0137   BUN 21 (H) 08/09/2022 0137   CREATININE 0.96 08/09/2022 0137   CALCIUM 7.7 (L) 08/09/2022 0137   GFRNONAA >60 08/09/2022 0137   GFRAA >60 01/15/2020 0836    INR No results found for: "INR"   Intake/Output Summary (Last 24 hours) at 08/10/2022 2542 Last data filed at 08/10/2022 0358 Gross per 24 hour  Intake --  Output 700 ml  Net -700 ml      Assessment/Plan:  57 y.o. female is s/p:  4/4- Left SFA and above knee popliteal artery mechanical thrombectomy, with angioplasty and stenting of left above knee popliteal artery and SFA   4/8- Right SFA and above knee popliteal artery laser atherectomy, with DCBA of the right above knee popliteal artery and  SFA   4/10- bilateral TMAs with Dr.Duda  2 Days Post-Op   -pt with + peroneal doppler flow bilaterally -wound vacs in place bilateral feet -management per Dr. Burna Sis -feels she needs rehab prior to discharge given her mother is 24 and that will be her help. -continue asa/statin/plavix -DVT prophylaxis:  may need to start sq heparin if ok with Dr. Chestine Spore and Dr. Lajoyce Corners.   -continue mobilization with PT   Doreatha Massed, PA-C [facility] [phone] 08/10/2022 6:42 AM  I have seen and evaluated the patient. I agree with the PA note as documented above.  Overall doing well.  Has had bilateral lower extremity revascularization this admission.  Continue aspirin Plavix statin.  Vacs to bilateral TMA's.  Tentative plan was discharge tomorrow home.  Patient now feels she cannot take care of herself at home with her mom's help alone.  Will have PT reevaluate her and then make plans on either rehab versus SNF which she is agreeable.  I discussed this will likely delay her discharge this weekend.  Cephus Shelling, MD [facility] [place] Office: [phone]

## 2022-08-10 NOTE — Progress Notes (Signed)
Physical Therapy Treatment Patient Details Name: Kayla Schmitt MRN: [id number] DOB: 08-03-1965 Today's Date: 08/10/2022   History of Present Illness 57 yo female admitted 4/4 for aortogram with Lt SFA thrombectomy and angioplasty due to bil non healing toe amputations (performed 07/20/22). 4/8 Rt SFA atherectomy and angioplasty. 4/10 bil transmet amp. PMHx: IDDM, GERD, HTN, PAD    PT Comments    Pt with mother present during today's session and reporting that after mom assessed home more thoroughly WC cannot fit room to room in home, mattress it too tall for WC transfer and pt feeling unsafe with plan to return home with intermittent assist from mom. Pt now agreeable to ST-SNF while remaining NWB bilaterally. Pt educated for [facility] parts and management and able to mobilize in hall. Greatest limiting factors for home are physical space and inaccessibility with recommendation changed to ST-SNF with pt and mom in agreement. Will continue to follow acutely and encouraged OOB daily.     Recommendations for follow up therapy are one component of a multi-disciplinary discharge planning process, led by the attending physician.  Recommendations may be updated based on patient status, additional functional criteria and insurance authorization.  Follow Up Recommendations  Can patient physically be transported by private vehicle: No    Assistance Recommended at Discharge Intermittent Supervision/Assistance  Patient can return home with the following A little help with walking and/or transfers;A little help with bathing/dressing/bathroom;Assistance with cooking/housework;Assist for transportation;Help with stairs or ramp for entrance   Equipment Recommendations  BSC/3in1    Recommendations for Other Services       Precautions / Restrictions Precautions Precautions: Other (comment) Precaution Comments: bil LE VAC Restrictions RLE Weight Bearing: Non weight bearing LLE Weight Bearing: Non weight  bearing     Mobility  Bed Mobility Overal bed mobility: Needs Assistance Bed Mobility: Supine to Sit, Sit to Supine     Supine to sit: Supervision Sit to supine: Supervision   General bed mobility comments: bed flat without rail with cues for sequence. PT able to transition into long sitting with ease increased time and cues for pivoting in sitting to supine with back to bed    Transfers Overall transfer level: Needs assistance   Transfers: Bed to chair/wheelchair/BSC         Anterior-Posterior transfers: Min guard   General transfer comment: pt with education for equipment setup and parts with transition bed <>WC this session. Pt needing direction for sequence and safety. PT with preference for A/P transfer rather than lateral with cues and assist to maintain VAC lines and safety    Ambulation/Gait                   Psychologist, counselling mobility: Yes Wheelchair propulsion: Both upper extremities Wheelchair parts: Supervision/cueing Distance: 150' Wheelchair Assistance Details (indicate cue type and reason): minguard with cue for parts, management and direction  Modified Rankin (Stroke Patients Only)       Balance Overall balance assessment: No apparent balance deficits (not formally assessed)                                          Cognition Arousal/Alertness: Awake/alert Behavior During Therapy: Anxious Overall Cognitive Status: Within Functional Limits for tasks assessed  General Comments: pt anxious regarding D/C        Exercises      General Comments        Pertinent Vitals/Pain Pain Assessment Pain Assessment: No/denies pain    Home Living                          Prior Function            PT Goals (current goals can now be found in the care plan section) Progress towards PT goals: Progressing  toward goals    Frequency    Min 1X/week      PT Plan Discharge plan needs to be updated;Equipment recommendations need to be updated    Co-evaluation              AM-PAC PT "6 Clicks" Mobility   Outcome Measure  Help needed turning from your back to your side while in a flat bed without using bedrails?: A Little Help needed moving from lying on your back to sitting on the side of a flat bed without using bedrails?: A Little Help needed moving to and from a bed to a chair (including a wheelchair)?: A Little Help needed standing up from a chair using your arms (e.g., wheelchair or bedside chair)?: Total Help needed to walk in hospital room?: Total Help needed climbing 3-5 steps with a railing? : Total 6 Click Score: 12    End of Session   Activity Tolerance: Patient tolerated treatment well Patient left: in bed;with call bell/phone within reach (pt denied OOB to recliner this date) Nurse Communication: Mobility status PT Visit Diagnosis: Other abnormalities of gait and mobility (R26.89)     Time: 0981-1914 PT Time Calculation (min) (ACUTE ONLY): 50 min  Charges:  $Therapeutic Activity: 23-37 mins $Wheel Chair Management: 8-22 mins                     [name], PT Acute Rehabilitation Services Office: [phone]    Enedina Finner Lonald Troiani 08/10/2022, 12:21 PM

## 2022-08-11 LAB — GLUCOSE, CAPILLARY
Glucose-Capillary: 118 mg/dL — ABNORMAL HIGH (ref 70–99)
Glucose-Capillary: 173 mg/dL — ABNORMAL HIGH (ref 70–99)
Glucose-Capillary: 139 mg/dL — ABNORMAL HIGH (ref 70–99)
Glucose-Capillary: 163 mg/dL — ABNORMAL HIGH (ref 70–99)

## 2022-08-11 NOTE — Progress Notes (Signed)
Mobility Specialist Progress Note   08/11/22 1137  Mobility  Activity  (Propelled in Hallway)  Level of Assistance Minimal assist, patient does 75% or more  Assistive Device Wheelchair  Distance Ambulated (ft)  (Propelled 312ft)  RLE Weight Bearing NWB  LLE Weight Bearing NWB  Activity Response Tolerated well  Mobility Referral Yes  $Mobility charge 1 Mobility   Received pt in bed having no complaints and agreeable. No physical assist needed for pt to AP transfer to w/c but minA used to stabilize w/c. Pt able to propelle self in hallway w/o assistance but requiring x3 rest breaks d/t fatigue. Able to return back to room and laterally roll back to bed w/o incident. Call bell in reach w/ needs met.  Frederico Hamman Mobility Specialist Please contact via SecureChat or  Rehab office at [phone]

## 2022-08-11 NOTE — Progress Notes (Addendum)
[facility] [place]  Subjective  - Pending SNFplacement   Objective 127/71 84 98 F (36.7 C) (Oral) 18 100%  Intake/Output Summary (Last 24 hours) at 08/11/2022 0840 Last data filed at 08/11/2022 0500 Gross per 24 hour  Intake 1390 ml  Output 1350 ml  Net 40 ml    B TMA wound vac to good suction followed by Dr. Lajoyce Corners Doppler peroneal signals B LE Groins soft without hematomas General no acute distress  Assessment/Planning: 57 y.o. female is s/p:  4/4- Left SFA and above knee popliteal artery mechanical thrombectomy, with angioplasty and stenting of left above knee popliteal artery and SFA   4/8- Right SFA and above knee popliteal artery laser atherectomy, with DCBA of the right above knee popliteal artery and SFA   4/10- bilateral TMAs with Dr.Duda  3 Days Post-Op  Generally no acute distress Peroneal inflow intact with doppler signals Vacs followed by Dr. Lajoyce Corners F/U arranged pending discharge to SNF   [facility] 08/11/2022 8:40 AM --  VASCULAR STAFF ADDENDUM: I have independently interviewed and examined the patient. I agree with the above.    Fara Olden, MD [facility] Phone Number: [phone] 08/11/2022 8:55 AM    Laboratory Lab Results: No results for input(s): "WBC", "HGB", "HCT", "PLT" in the last 72 hours. BMET Recent Labs    08/09/22 0137  NA 133*  K 3.9  CL 106  CO2 18*  GLUCOSE 311*  BUN 21*  CREATININE 0.96  CALCIUM 7.7*    COAG No results found for: "INR", "PROTIME" No results found for: "PTT"

## 2022-08-12 LAB — GLUCOSE, CAPILLARY
Glucose-Capillary: 128 mg/dL — ABNORMAL HIGH (ref 70–99)
Glucose-Capillary: 170 mg/dL — ABNORMAL HIGH (ref 70–99)
Glucose-Capillary: 147 mg/dL — ABNORMAL HIGH (ref 70–99)
Glucose-Capillary: 152 mg/dL — ABNORMAL HIGH (ref 70–99)

## 2022-08-12 NOTE — Progress Notes (Addendum)
[facility] [place]  Subjective  - No new complaints.  Waiting for SNF   Objective 118/62 77 98.2 F (36.8 C) (Oral) 18 100%  Intake/Output Summary (Last 24 hours) at 08/12/2022 9629 Last data filed at 08/11/2022 1723 Gross per 24 hour  Intake 480 ml  Output 1100 ml  Net -620 ml   General no acute distress  Groins soft TMA vac dressing with good seal Doppler peroneal signals B LE  Assessment/Planning: 57 y.o. female is s/p:  4/4- Left SFA and above knee popliteal artery mechanical thrombectomy, with angioplasty and stenting of left above knee popliteal artery and SFA   4/8- Right SFA and above knee popliteal artery laser atherectomy, with DCBA of the right above knee popliteal artery and SFA   4/10- bilateral TMAs with Dr.Duda  3 Days Post-Op   Generally no acute distress Peroneal inflow intact with doppler signals Vacs followed by Dr. Lajoyce Corners F/U arranged pending discharge to SNF she is heel weight bearing with Darco shoes for transfers only per DR. Marianna Payment 08/12/2022 7:12 AM --  VASCULAR STAFF ADDENDUM: I have independently interviewed and examined the patient. I agree with the above.  Okay to leave floor to get sunshine if pt chooses to  Fara Olden, MD [facility] Phone Number: [phone] 08/12/2022 8:41 AM    Laboratory Lab Results: No results for input(s): "WBC", "HGB", "HCT", "PLT" in the last 72 hours. BMET No results for input(s): "NA", "K", "CL", "CO2", "GLUCOSE", "BUN", "CREATININE", "CALCIUM" in the last 72 hours.  COAG No results found for: "INR", "PROTIME" No results found for: "PTT"

## 2022-08-13 LAB — GLUCOSE, CAPILLARY
Glucose-Capillary: 110 mg/dL — ABNORMAL HIGH (ref 70–99)
Glucose-Capillary: 133 mg/dL — ABNORMAL HIGH (ref 70–99)
Glucose-Capillary: 152 mg/dL — ABNORMAL HIGH (ref 70–99)
Glucose-Capillary: 133 mg/dL — ABNORMAL HIGH (ref 70–99)

## 2022-08-13 MED ORDER — ZINC SULFATE 220 (50 ZN) MG PO CAPS: 220.0000 mg | ORAL_CAPSULE | Freq: Every day | ORAL | Status: DC

## 2022-08-13 MED ORDER — HEPARIN SODIUM (PORCINE) 5000 UNIT/ML IJ SOLN
5000.0000 [IU] | Freq: Three times a day (TID) | INTRAMUSCULAR | Status: DC
  Administered 2022-08-13: 5000 [IU] via SUBCUTANEOUS
  Filled 2022-08-13: qty 1

## 2022-08-13 MED ORDER — OXYCODONE-ACETAMINOPHEN 5-325 MG PO TABS: 1.0000 | ORAL_TABLET | Freq: Four times a day (QID) | ORAL | 0 refills | Status: DC | PRN

## 2022-08-13 NOTE — Discharge Summary (Signed)
Discharge Summary    Kayla Schmitt [birth year] 57 y.o. female  [id number]  Admission Date: 08/02/2022  Discharge Date: 08/14/2022 Physician: Cephus Shelling, MD  Admission Diagnosis: Critical limb ischemia of both lower extremities [I70.223]   HPI:   This is a 57 y.o. female who was worked in urgently to office schedule today for evaluation of nonhealing toe amputations of bilateral lower extremities. Toe amputations were performed by Dr. Lajoyce Corners on 07/20/2022. She underwent left SFA stenting and above-the-knee popliteal balloon angioplasty by Dr. Chestine Spore on 07/05/2022. She also has history of right SFA stenting by Dr. Chestine Spore on 01/14/2020. She is on her aspirin and Plavix daily. She was started on Bactrim by Dr. Lajoyce Corners today. She denies fevers, chills, nausea/vomiting. Past medical history also significant for insulin-dependent diabetes mellitus.   Hospital Course:  The patient was admitted to the hospital and taken to the [facility] lab on 08/02/2022 and underwent: 1.  Ultrasound-guided access left common femoral artery 2.  Right lower extremity arteriogram with selection of third order branches 3.  Laser atherectomy of the right SFA and above-knee popliteal artery (2.0 mm Auryon laser at 50 MHz and 60 MHz) 4.  Drug-coated balloon angioplasty of the entire right SFA and above-knee popliteal artery (5.0 mm drug-coated Ranger) 5.  52 minutes of monitored moderate conscious sedation time    Findings as follows: The right SFA and above-knee popliteal artery was treated in its entirety with a 2.0 mm Auryon laser at 50 and 60 MHz.  The right SFA and above-knee popliteal artery was treated with drug-coated balloon angioplasty to nominal pressure for 3 minutes with a 5 mm drug coated Ranger.  Widely patent SFA above-knee popliteal artery with patent stents at completion.  Preserved three-vessel runoff distally.  Dominant runoff in the foot is through the posterior tibial artery.   The pt tolerated  the procedure well and was transported to the PACU in good condition.   On 08/06/2022, she was taken back to the PV lab and underwent: 1.  Ultrasound-guided access left common femoral artery 2.  Right lower extremity arteriogram with selection of third order branches 3.  Laser atherectomy of the right SFA and above-knee popliteal artery (2.0 mm Auryon laser at 50 MHz and 60 MHz) 4.  Drug-coated balloon angioplasty of the entire right SFA and above-knee popliteal artery (5.0 mm drug-coated Ranger) 5.  52 minutes of monitored moderate conscious sedation time  Findings: Aortogram was deferred given this was performed last week.  Right lower extremity arteriogram again showed a moderate stenosis in the proximal SFA, multiple high-grade stenosis greater than 80% in the previous SFA stents with in-stent stenosis, and a high-grade greater than 80% calcified above-knee popliteal stenosis.   She was seen by Dr. Lajoyce Corners on 08/07/2022 and she was still having purulent drainage with exposed bone on the forefoot bilaterally.    On 08/08/2022, she was taken to the operating room and underwent bilateral TMA with application of Kerecis and prevena vac bilaterally.    Findings: Tissue margins were clear good petechial bleeding.   She continued to have brisk doppler flow at the ankle and pain well controlled.  She was recommended for HHPT, however, given her elderly mother was going to be helping her, she felt it would be better to bridge to home with rehab facility.  TOC was consulted for placement.   Wound vac management per Dr. Lajoyce Corners and she does have follow up scheduled.    On 08/14/2022, pt decided she would rather  return home with [facility] services.  PT worked with her prior to discharge and she was discharged home.     CBC    Component Value Date/Time   WBC 11.1 (H) 08/07/2022 0238   RBC 2.84 (L) 08/07/2022 0238   HGB 8.2 (L) 08/07/2022 0238   HCT 24.8 (L) 08/07/2022 0238   PLT 335 08/07/2022 0238   MCV 87.3  08/07/2022 0238   MCH 28.9 08/07/2022 0238   MCHC 33.1 08/07/2022 0238   RDW 15.2 08/07/2022 0238   LYMPHSABS 2.3 02/04/2022 1721   MONOABS 0.6 02/04/2022 1721   EOSABS 0.2 02/04/2022 1721   BASOSABS 0.0 02/04/2022 1721    BMET    Component Value Date/Time   NA 133 (L) 08/09/2022 0137   K 3.9 08/09/2022 0137   CL 106 08/09/2022 0137   CO2 18 (L) 08/09/2022 0137   GLUCOSE 311 (H) 08/09/2022 0137   BUN 21 (H) 08/09/2022 0137   CREATININE 0.96 08/09/2022 0137   CALCIUM 7.7 (L) 08/09/2022 0137   GFRNONAA >60 08/09/2022 0137   GFRAA >60 01/15/2020 0836        Discharge Diagnosis:  Critical limb ischemia of both lower extremities [I70.223]  Secondary Diagnosis: Patient Active Problem List   Diagnosis Date Noted   Critical limb ischemia of both lower extremities 06/19/2022   DM type 2 (diabetes mellitus, type 2) 01/28/2020   Hyperlipidemia 01/28/2020   Hypertension 01/28/2020   Hypothyroidism 01/28/2020   Obesity 01/28/2020   Ulcerated, foot, right, limited to breakdown of skin    Cellulitis of right foot    Peripheral arterial disease    Cellulitis 01/11/2020   Blister of leg 01/06/2020   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/28/2017   Chest pain 10/28/2017   GERD (gastroesophageal reflux disease) 10/28/2017   Past Medical History:  Diagnosis Date   Arthritis    Critical limb ischemia of both lower extremities    Diabetes mellitus without complication    type 2   GERD (gastroesophageal reflux disease)    Hypercholesteremia    Hypertension    Hypothyroidism    PAD (peripheral artery disease)    PONV (postoperative nausea and vomiting)    Thyroid disease      Allergies as of 08/13/2022       Reactions   Doxycycline Nausea And Vomiting   Extremely N/V   Trental [pentoxifylline] Nausea And Vomiting   Penicillins Rash   Tolerated cephalosporins 07/2022        Medication List     STOP taking these medications    lisinopril-hydrochlorothiazide  20-25 MG tablet Commonly known as: ZESTORETIC   sulfamethoxazole-trimethoprim 800-160 MG tablet Commonly known as: BACTRIM DS       TAKE these medications    acetaminophen 500 MG tablet Commonly known as: TYLENOL Take 500-1,000 mg by mouth every 6 (six) hours as needed for moderate pain.   amLODipine 10 MG tablet Commonly known as: NORVASC Take 10 mg by mouth daily.   aspirin EC 81 MG tablet Take 1 tablet by mouth 2 (two) times a week.   atorvastatin 40 MG tablet Commonly known as: LIPITOR Take 40 mg by mouth daily.   clopidogrel 75 MG tablet Commonly known as: PLAVIX TAKE 1 TABLET BY MOUTH EVERY DAY WITH BREAKFAST   famotidine 20 MG tablet Commonly known as: PEPCID Take 20 mg by mouth daily.   gabapentin 300 MG capsule Commonly known as: NEURONTIN Take 300 mg by mouth daily.   ibuprofen 600 MG  tablet Commonly known as: ADVIL Take 1 tablet (600 mg total) by mouth every 6 (six) hours as needed.   levothyroxine 150 MCG tablet Commonly known as: SYNTHROID Take 150 mcg by mouth daily.   nitroGLYCERIN 0.2 mg/hr patch Commonly known as: NITRODUR - Dosed in mg/24 hr Place 1 patch (0.2 mg total) onto the skin daily.   oxyCODONE-acetaminophen 5-325 MG tablet Commonly known as: PERCOCET/ROXICET Take 1 tablet by mouth every 6 (six) hours as needed. What changed: when to take this   Ozempic (1 MG/DOSE) 2 MG/1.5ML Sopn Generic drug: Semaglutide (1 MG/DOSE) Inject 1 mg into the skin every Saturday.   pantoprazole 40 MG tablet Commonly known as: PROTONIX Take 40 mg by mouth daily.   pentoxifylline 400 MG CR tablet Commonly known as: TRENTAL Take 1 tablet (400 mg total) by mouth 3 (three) times daily with meals.   Evaristo Bury FlexTouch 100 UNIT/ML FlexTouch Pen Generic drug: insulin degludec Inject 10 Units into the skin at bedtime. What changed: how much to take   zinc sulfate 220 (50 Zn) MG capsule Take 1 capsule (220 mg total) by mouth daily for 9 days.    ZzzQuil 25 MG Caps Generic drug: diphenhydrAMINE HCl (Sleep) Take 25 mg by mouth at bedtime as needed (Sleep).               Durable Medical Equipment  (From admission, onward)           Start     Ordered   08/09/22 1113  For home use only DME Bedside commode  Once       Comments: Drop arm  Question:  Patient needs a bedside commode to treat with the following condition  Answer:  Status post transmetatarsal amputation of foot   08/09/22 1112   08/09/22 1032  For home use only DME Other see comment  Once       Comments: 1) drop arm beside commode 2) sliding board for wheelchair to bed transfers  Question:  Length of Need  Answer:  6 Months   08/09/22 1032   08/09/22 1030  For home use only DME standard manual wheelchair with seat cushion  Once       Comments: Patient suffers from bilateral transmetatarsal amputation which impairs their ability to perform daily activities like bathing, dressing, grooming, and toileting in the home.  A cane, crutch, or walker will not resolve issue with performing activities of daily living. A wheelchair will allow patient to safely perform daily activities. Patient can safely propel the wheelchair in the home or has a caregiver who can provide assistance. Length of need 6 months . Accessories: elevating leg rests (ELRs), wheel locks, extensions and anti-tippers.   08/09/22 1032              Instructions:  [facility]  Discharge Instructions  Lower Extremity Angiogram; Angioplasty/Stenting  Please refer to the following instructions for your post-procedure care. Your surgeon or physician assistant will discuss any changes with you.  Activity  Avoid lifting more than 8 pounds (1 gallons of milk) for 72 hours (3 days) after your procedure. You may walk as much as you can tolerate. It's OK to drive after 72 hours.  Bathing/Showering  You may shower the day after your procedure. If you have a  bandage, you may remove it at 24- 48 hours. Clean your incision site with mild soap and water. Pat the area dry with a clean towel.  Diet  Resume  your pre-procedure diet. There are no special food restrictions following this procedure. All patients with peripheral vascular disease should follow a low fat/low cholesterol diet. In order to heal from your surgery, it is CRITICAL to get adequate nutrition. Your body requires vitamins, minerals, and protein. Vegetables are the best source of vitamins and minerals. Vegetables also provide the perfect balance of protein. Processed food has little nutritional value, so try to avoid this.  Medications  Resume taking all of your medications unless your doctor tells you not to. If your incision is causing pain, you may take over-the-counter pain relievers such as acetaminophen (Tylenol)  Follow Up  Follow up will be arranged at the time of your procedure. You may have an office visit scheduled or may be scheduled for surgery. Ask your surgeon if you have any questions.  Please call us immediately for any of the following conditions: Severe or worsening pain your legs or feet at rest or with walking. Increased pain, redness, drainage at your groin puncture site. Fever of 101 degrees or higher. If you have any mild or slow bleeding from your puncture site: lie down, apply firm constant pressure over the area with a piece of gauze or a clean wash cloth for 30 minutes- no peeking!, call 911 right away if you are still bleeding after 30 minutes, or if the bleeding is heavy and unmanageable.  Reduce your risk factors of vascular disease:  Stop smoking. If you would like help call [facility] at 1-800-QUIT-NOW ((712)285-3466) or [place] at 5516671101. Manage your cholesterol Maintain a desired weight Control your diabetes Keep your blood pressure down  If you have any questions, please call the office at [phone]  Prescriptions given: No  prescriptions given  Disposition: home with [facility] services  Patient's condition: is Good  Follow up: 1. VVS in 4-6 weeks with BLE arterial duplex and ABI (office will arrange appt) 2. Dr. Lajoyce Corners one week   Doreatha Massed, PA-C [facility] [phone] 08/13/2022  7:54 AM

## 2022-08-13 NOTE — Progress Notes (Signed)
Mobility Specialist: Progress Note   08/13/22 1604  Mobility  Activity Transferred from chair to bed  Level of Assistance Contact guard assist, steadying assist  Assistive Device Sliding board  RLE Weight Bearing NWB  LLE Weight Bearing NWB  Activity Response Tolerated well  Mobility Referral Yes  $Mobility charge 1 Mobility   Pt assisted back to bed per request. No c/o throughout. Pt is in the bed with call bell and phone in reach.   Kayla Schmitt Mobility Specialist Please contact via SecureChat or Rehab office at [phone]

## 2022-08-13 NOTE — TOC Initial Note (Signed)
Transition of Care [facility]) - Initial/Assessment Note    Patient Details  Name: Kayla Schmitt MRN: [id number] Date of Birth: 12/24/1965  [facility]) CM/SW Contact:    Eduard Roux, LCSW Phone Number: 08/13/2022, 5:58 PM  Clinical Narrative:       CSW spoke with patient by phone. CSW introduced self and explained role. Patient states she is aware of therapy recommendation for short term rehab at [facility] and is agreeable to SNF placement.  CSW explained the SNF process. Preferred SNF is [facility] or [facility]. All questions answered.   TOC will provide bed offers once available.      Antony Blackbird, MSW, LCSW Clinical Social Worker            Expected Discharge Plan: Skilled Nursing Facility Barriers to Discharge: SNF Pending bed offer, Insurance Authorization   Patient Goals and CMS Choice Patient states their goals for this hospitalization and ongoing recovery are:: return home CMS Medicare.gov Compare Post Acute Care list provided to:: Patient Choice offered to / list presented to : Patient      Expected Discharge Plan and Services   Discharge Planning Services: CM Consult Post Acute Care Choice: Durable Medical Equipment, Home Health Living arrangements for the past 2 months: Single Family Home                 DME Arranged: Bedside commode, Wheelchair manual, Other see comment DME Agency: [employer] Date DME Agency Contacted: 08/09/22 Time DME Agency Contacted: 1445 Representative spoke with at DME Agency: Belenda Cruise HH Arranged: PT HH Agency: [facility] Date [facility] Agency Contacted: 08/10/22 Time HH Agency Contacted: 1154 Representative spoke with at [facility] Agency: Bjorn Loser  Prior Living Arrangements/Services Living arrangements for the past 2 months: Single Family Home Lives with:: Self Patient language and need for interpreter reviewed:: No Do you feel safe going back to the place where you live?: Yes      Need for Family  Participation in Patient Care: Yes (Comment) Care giver support system in place?: Yes (comment)   Criminal Activity/Legal Involvement Pertinent to Current Situation/Hospitalization: No - Comment as needed  Activities of Daily Living Home Assistive Devices/Equipment: Walker (specify type), Dentures (specify type), Eyeglasses, CBG Meter ADL Screening (condition at time of admission) Patient's cognitive ability adequate to safely complete daily activities?: Yes Is the patient deaf or have difficulty hearing?: No Does the patient have difficulty seeing, even when wearing glasses/contacts?: No Does the patient have difficulty concentrating, remembering, or making decisions?: No Patient able to express need for assistance with ADLs?: Yes Does the patient have difficulty dressing or bathing?: No Independently performs ADLs?: Yes (appropriate for developmental age) Does the patient have difficulty walking or climbing stairs?: No Weakness of Legs: Both Weakness of Arms/Hands: None  Permission Sought/Granted Permission sought to share information with : Facility Industrial/product designer granted to share information with : Yes, Verbal Permission Granted     Permission granted to share info w AGENCY: SNFs        Emotional Assessment Appearance:: Appears stated age Attitude/Demeanor/Rapport: Engaged Affect (typically observed): Accepting, Appropriate Orientation: : Oriented to Self, Oriented to Situation, Oriented to Place, Oriented to  Time Alcohol / Substance Use: Not Applicable Psych Involvement: No (comment)  Admission diagnosis:  Critical limb ischemia of both lower extremities [I70.223] Patient Active Problem List   Diagnosis Date Noted   Critical limb ischemia of both lower extremities 06/19/2022   DM type 2 (diabetes mellitus, type 2) 01/28/2020  Hyperlipidemia 01/28/2020   Hypertension 01/28/2020   Hypothyroidism 01/28/2020   Obesity 01/28/2020   Ulcerated, foot,  right, limited to breakdown of skin    Cellulitis of right foot    Peripheral arterial disease    Cellulitis 01/11/2020   Blister of leg 01/06/2020   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 - [place], [place] - [address] [place] [address] Phone: [phone] Fax: [phone]     Social Determinants of Health (SDOH) Social History: SDOH Screenings   Food Insecurity: No Food Insecurity (08/03/2022)  Housing: Low Risk  (08/03/2022)  Transportation Needs: No Transportation Needs (08/03/2022)  Utilities: Not At Risk (08/03/2022)  Tobacco Use: Medium Risk (08/08/2022)   SDOH Interventions:     Readmission Risk Interventions     No data to display

## 2022-08-13 NOTE — Progress Notes (Signed)
Orthopedic Tech Progress Note Patient Details:  Kayla Schmitt April 21, 1966 [id number]  Ortho Devices Type of Ortho Device: Darco shoe Ortho Device/Splint Location: BLE Ortho Device/Splint Interventions: Application, Ordered  Darco shoes left in room. Post Interventions Patient Tolerated: Well  Sherilyn Banker 08/13/2022, 6:29 PM

## 2022-08-13 NOTE — Progress Notes (Signed)
Physical Therapy Treatment Patient Details Name: Kayla Schmitt MRN: [id number] DOB: [birth year] Today's Date: 08/13/2022   History of Present Illness 57 yo female admitted 4/4 for aortogram with Lt SFA thrombectomy and angioplasty due to bil non healing toe amputations (performed 07/20/22). 4/8 Rt SFA atherectomy and angioplasty. 4/10 bil transmet amp. PMHx: IDDM, GERD, HTN, PAD    PT Comments    Pt received in supine, agreeable to therapy session and with good participation and tolerance for lateral scoot transfer training using slide board and supine/seated exercise instruction. Verbal/visual review for prone hip extension, sidelying hip abduction, seated/reclined supine BLE exercises for strengthening and chair push-ups, pt receptive to instruction and performed seated/reclined exercises with min cues and good carryover. Pt needing minA at most for lateral scoot transfer using slide board. Plan to work more on wheelchair part/safety and mobility instruction next session, pt would benefit from handout to reinforce HEP next session. No darco shoes seen in room, MD messaged to order these if he plans for her to remain heel WB for transfers as per 4/12 order, pt has been compliant with BLE NWB during transfers thus far. Pt continues to benefit from PT services to progress toward functional mobility goals.    Recommendations for follow up therapy are one component of a multi-disciplinary discharge planning process, led by the attending physician.  Recommendations may be updated based on patient status, additional functional criteria and insurance authorization.  Follow Up Recommendations  Can patient physically be transported by private vehicle: No    Assistance Recommended at Discharge Intermittent Supervision/Assistance  Patient can return home with the following A little help with walking and/or transfers;A little help with bathing/dressing/bathroom;Assistance with cooking/housework;Assist  for transportation;Help with stairs or ramp for entrance   Equipment Recommendations  BSC/3in1;Other (comment) (consider slide board (longer length for car transfers) and drop arm BSC if possible.)    Recommendations for Other Services       Precautions / Restrictions Precautions Precautions: Other (comment) Precaution Comments: bil LE VAC Restrictions Weight Bearing Restrictions: Yes RLE Weight Bearing:  (heel WB with Darco shoes only) LLE Weight Bearing:  (heel WB with Darco shoes only) Other Position/Activity Restrictions: She is heel weight bearing with Darco shoes for transfers only per Dr. Lajoyce Corners (4/12 MD order)     Mobility  Bed Mobility Overal bed mobility: Needs Assistance Bed Mobility: Supine to Sit     Supine to sit: Modified independent (Device/Increase time), HOB elevated     General bed mobility comments: modI to long sit from elevated HOB ~20* using side rail.    Transfers Overall transfer level: Needs assistance Equipment used: Sliding board Transfers: Bed to chair/wheelchair/BSC            Lateral/Scoot Transfers: Min assist, With slide board General transfer comment: pt with education for safe technique using slide board and needed assist to place board and subsequently pt was able to remove board once in chair with cues. PTA assist to maintain VAC lines and safety and to slide pad under her for ease of scooting and to prevent chafing/sticking of skin on board. Pt needs cues for improved body mechanics and safety with technique. Board in room for return transfer and mobility specialist notified to check with her in ~1hour to assist with return.    Ambulation/Gait                   Optometrist  Ambulance person Details (indicate cue type and reason): defer, emphasis on exercise instruction and lateral scoot transfers this date  Modified Rankin (Stroke Patients Only)       Balance  Overall balance assessment: No apparent balance deficits (not formally assessed)                                          Cognition Arousal/Alertness: Awake/alert Behavior During Therapy: Anxious Overall Cognitive Status: Within Functional Limits for tasks assessed                                 General Comments: pt self-directed and cooperative for functional mobility, expressed frustration with recliner style.        Exercises Other Exercises Other Exercises: supine BLE AROM: ankle pumps, hip abduction, hip ADduction pillow squeezes, SLR, knee flexion/ext x5-10 reps ea, encouraged her to perform hourly Other Exercises: chair push-ups x10 reps cues for tricep/core activation    General Comments General comments (skin integrity, edema, etc.): reviewed edema mgmt and pressure relief techniques while pt in chair, pt expressed discomfort with BLE elevated while up in recliner.      Pertinent Vitals/Pain Pain Assessment Pain Assessment: Faces Faces Pain Scale: Hurts little more Pain Location: BLE Pain Descriptors / Indicators: Discomfort Pain Intervention(s): Monitored during session, Limited activity within patient's tolerance, Repositioned, Premedicated before session     PT Goals (current goals can now be found in the care plan section) Acute Rehab PT Goals PT Goal Formulation: With patient Time For Goal Achievement: 08/23/22 Progress towards PT goals: Progressing toward goals    Frequency    Min 1X/week      PT Plan Current plan remains appropriate       AM-PAC PT "6 Clicks" Mobility   Outcome Measure  Help needed turning from your back to your side while in a flat bed without using bedrails?: A Little Help needed moving from lying on your back to sitting on the side of a flat bed without using bedrails?: A Little Help needed moving to and from a bed to a chair (including a wheelchair)?: A Little Help needed standing up from a  chair using your arms (e.g., wheelchair or bedside chair)?: Total Help needed to walk in hospital room?: Total Help needed climbing 3-5 steps with a railing? : Total 6 Click Score: 12    End of Session Equipment Utilized During Treatment: Other (comment) (slide board/bed pad) Activity Tolerance: Patient tolerated treatment well Patient left: with call bell/phone within reach;in chair;with family/visitor present (pt agreeable to sit in chair ~1 hr with a lot of encouragement) Nurse Communication: Mobility status;Other (comment) (slide board transfer, ask for mobility to assist) PT Visit Diagnosis: Other abnormalities of gait and mobility (R26.89)     Time: 4098-1191 PT Time Calculation (min) (ACUTE ONLY): 17 min  Charges:  $Therapeutic Exercise: 8-22 mins                     Janmichael Giraud P., PTA Acute Rehabilitation Services Secure Chat Preferred 9a-5:30pm Office: [phone]    Dorathy Kinsman [facility] 08/13/2022, 3:08 PM

## 2022-08-13 NOTE — NC FL2 (Signed)
[place] MEDICAID FL2 LEVEL OF CARE FORM     IDENTIFICATION  Patient Name: Kayla Schmitt Birthdate: February 21, 1966 Sex: female Admission Date (Current Location): 08/02/2022  [facility] and [place] Number:  [employer] and Address:  [facility] [place], [address], [place], [place] [address]      Provider Number: [id number]  Attending Physician Name and Address:  Cephus Shelling, MD  Relative Name and Phone Number:       Current Level of Care: Hospital Recommended Level of Care: Skilled Nursing Facility Prior Approval Number:    Date Approved/Denied:   PASRR Number: [id number]  Discharge Plan: SNF    Current Diagnoses: Patient Active Problem List   Diagnosis Date Noted   Critical limb ischemia of both lower extremities 06/19/2022   DM type 2 (diabetes mellitus, type 2) 01/28/2020   Hyperlipidemia 01/28/2020   Hypertension 01/28/2020   Hypothyroidism 01/28/2020   Obesity 01/28/2020   Ulcerated, foot, right, limited to breakdown of skin    Cellulitis of right foot    Peripheral arterial disease    Cellulitis 01/11/2020   Blister of leg 01/06/2020   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/28/2017   Chest pain 10/28/2017   GERD (gastroesophageal reflux disease) 10/28/2017    Orientation RESPIRATION BLADDER Height & Weight     Self, Time, Situation, Place  Normal Continent, External catheter Weight: 202 lb 6.1 oz (91.8 kg) Height:   (162.6 cm)  BEHAVIORAL SYMPTOMS/MOOD NEUROLOGICAL BOWEL NUTRITION STATUS      Continent Diet (please see discharge summary)  AMBULATORY STATUS COMMUNICATION OF NEEDS Skin   Limited Assist Verbally Surgical wounds (closed incison left and right foot)                       Personal Care Assistance Level of Assistance  Bathing, Feeding, Dressing Bathing Assistance: Limited assistance Feeding assistance: Independent Dressing Assistance: Limited assistance     Functional Limitations  Info  Sight, Hearing, Speech Sight Info: Adequate Hearing Info: Adequate Speech Info: Adequate    SPECIAL CARE FACTORS FREQUENCY  PT (By licensed PT), OT (By licensed OT)     PT Frequency: 5x per week OT Frequency: 5x per week            Contractures Contractures Info: Not present    Additional Factors Info  Code Status, Allergies Code Status Info: FULL Allergies Info: Doxycycline,trental,penicillins           Current Medications (08/13/2022):  This is the current hospital active medication list Current Facility-Administered Medications  Medication Dose Route Frequency Provider Last Rate Last Admin   0.9 %  sodium chloride infusion   Intravenous Continuous Nadara Mustard, MD 100 mL/hr at 08/07/22 2118 New Bag at 08/07/22 2118   0.9 %  sodium chloride infusion  250 mL Intravenous PRN Nadara Mustard, MD       0.9 %  sodium chloride infusion   Intravenous Continuous Nadara Mustard, MD       acetaminophen (TYLENOL) tablet 325-650 mg  325-650 mg Oral Q6H PRN Nadara Mustard, MD   650 mg at 08/13/22 0851   alum & mag hydroxide-simeth (MAALOX/MYLANTA) 200-200-20 MG/5ML suspension 15-30 mL  15-30 mL Oral Q2H PRN Nadara Mustard, MD       amLODipine (NORVASC) tablet 10 mg  10 mg Oral Daily Nadara Mustard, MD   10 mg at 08/13/22 8469   ascorbic acid (VITAMIN C)  tablet 1,000 mg  1,000 mg Oral Daily Nadara Mustard, MD   1,000 mg at 08/13/22 0850   aspirin EC tablet 81 mg  81 mg Oral Once per day on Mon Thu Duda, Marcus V, MD   81 mg at 08/13/22 0851   atorvastatin (LIPITOR) tablet 40 mg  40 mg Oral Daily Nadara Mustard, MD   40 mg at 08/13/22 0850   bisacodyl (DULCOLAX) EC tablet 5 mg  5 mg Oral Daily PRN Nadara Mustard, MD       clopidogrel (PLAVIX) tablet 75 mg  75 mg Oral Daily Nadara Mustard, MD   75 mg at 08/13/22 1610   docusate sodium (COLACE) capsule 100 mg  100 mg Oral Daily Nadara Mustard, MD       gabapentin (NEURONTIN) capsule 300 mg  300 mg Oral Daily Nadara Mustard, MD   300  mg at 08/12/22 2123   guaiFENesin-dextromethorphan (ROBITUSSIN DM) 100-10 MG/5ML syrup 15 mL  15 mL Oral Q4H PRN Nadara Mustard, MD       heparin injection 5,000 Units  5,000 Units Subcutaneous Q8H Dara Lords, PA-C   5,000 Units at 08/13/22 9604   hydrALAZINE (APRESOLINE) injection 5 mg  5 mg Intravenous Q20 Min PRN Nadara Mustard, MD       hydrALAZINE (APRESOLINE) injection 5 mg  5 mg Intravenous Q20 Min PRN Nadara Mustard, MD       HYDROmorphone (DILAUDID) injection 0.5-1 mg  0.5-1 mg Intravenous Q4H PRN Nadara Mustard, MD       insulin aspart (novoLOG) injection 0-20 Units  0-20 Units Subcutaneous TID WC Nadara Mustard, MD   3 Units at 08/13/22 1336   insulin glargine-yfgn (SEMGLEE) injection 10 Units  10 Units Subcutaneous QHS Nadara Mustard, MD   10 Units at 08/12/22 2124   labetalol (NORMODYNE) injection 10 mg  10 mg Intravenous Q10 min PRN Nadara Mustard, MD       labetalol (NORMODYNE) injection 10 mg  10 mg Intravenous Q10 min PRN Nadara Mustard, MD       levothyroxine (SYNTHROID) tablet 150 mcg  150 mcg Oral Daily Nadara Mustard, MD   150 mcg at 08/13/22 5409   magnesium citrate solution 1 Bottle  1 Bottle Oral Once PRN Nadara Mustard, MD       magnesium sulfate IVPB 2 g 50 mL  2 g Intravenous Daily PRN Nadara Mustard, MD       metoprolol tartrate (LOPRESSOR) injection 2-5 mg  2-5 mg Intravenous Q2H PRN Nadara Mustard, MD       nutrition supplement (JUVEN) (JUVEN) powder packet 1 packet  1 packet Oral BID BM Nadara Mustard, MD   1 packet at 08/13/22 0850   ondansetron (ZOFRAN) injection 4 mg  4 mg Intravenous Q6H PRN Nadara Mustard, MD   4 mg at 08/04/22 0858   ondansetron (ZOFRAN) injection 4 mg  4 mg Intravenous Q6H PRN Nadara Mustard, MD       ondansetron [facility]) injection 4 mg  4 mg Intravenous Q6H PRN Nadara Mustard, MD       oxyCODONE (Oxy IR/ROXICODONE) immediate release tablet 10-15 mg  10-15 mg Oral Q4H PRN Nadara Mustard, MD       oxyCODONE (Oxy IR/ROXICODONE) immediate  release tablet 5-10 mg  5-10 mg Oral Q4H PRN Nadara Mustard, MD       oxyCODONE-acetaminophen (PERCOCET/ROXICET) [phone]  MG per tablet 1 tablet  1 tablet Oral Q4H PRN Nadara Mustard, MD       pantoprazole (PROTONIX) EC tablet 40 mg  40 mg Oral Daily Nadara Mustard, MD   40 mg at 08/13/22 0854   phenol (CHLORASEPTIC) mouth spray 1 spray  1 spray Mouth/Throat PRN Nadara Mustard, MD       polyethylene glycol (MIRALAX / GLYCOLAX) packet 17 g  17 g Oral Daily PRN Nadara Mustard, MD       potassium chloride SA (KLOR-CON M) CR tablet 20-40 mEq  20-40 mEq Oral Daily PRN Nadara Mustard, MD       promethazine (PHENERGAN) 12.5 mg in sodium chloride 0.9 % 50 mL IVPB  12.5 mg Intravenous Q6H PRN Nadara Mustard, MD 200 mL/hr at 08/04/22 0204 12.5 mg at 08/04/22 0204   sodium chloride flush (NS) 0.9 % injection 3 mL  3 mL Intravenous Q12H Nadara Mustard, MD   3 mL at 08/13/22 0859   sodium chloride flush (NS) 0.9 % injection 3 mL  3 mL Intravenous PRN Nadara Mustard, MD       sodium chloride flush (NS) 0.9 % injection 3 mL  3 mL Intravenous Q12H Nadara Mustard, MD   3 mL at 08/13/22 0859   sodium chloride flush (NS) 0.9 % injection 3 mL  3 mL Intravenous PRN Nadara Mustard, MD       zinc sulfate capsule 220 mg  220 mg Oral Daily Nadara Mustard, MD   220 mg at 08/13/22 6599     Discharge Medications: Please see discharge summary for a list of discharge medications.  Relevant Imaging Results:  Relevant Lab Results:   Additional Information SSN [id number]  Eduard Roux, LCSW

## 2022-08-13 NOTE — Progress Notes (Addendum)
  Progress Note    08/13/2022 6:39 AM 5 Days Post-Op  Subjective:  feels good this morning  Afebrile HR 70's-80's  120's-130's systolic 96% RA  Vitals:   08/12/22 2009 08/13/22 0350  BP: 131/61 123/64  Pulse: 81 76  Resp: 18 17  Temp: 98.9 F (37.2 C) 98.7 F (37.1 C)  SpO2: 99% 96%    Physical Exam: General:  no distress Cardiac:  regular Lungs:  non labored Extremities:  brisk peroneal doppler signals bilaterally; wound vac to both feet   CBC    Component Value Date/Time   WBC 11.1 (H) 08/07/2022 0238   RBC 2.84 (L) 08/07/2022 0238   HGB 8.2 (L) 08/07/2022 0238   HCT 24.8 (L) 08/07/2022 0238   PLT 335 08/07/2022 0238   MCV 87.3 08/07/2022 0238   MCH 28.9 08/07/2022 0238   MCHC 33.1 08/07/2022 0238   RDW 15.2 08/07/2022 0238   LYMPHSABS 2.3 02/04/2022 1721   MONOABS 0.6 02/04/2022 1721   EOSABS 0.2 02/04/2022 1721   BASOSABS 0.0 02/04/2022 1721    BMET    Component Value Date/Time   NA 133 (L) 08/09/2022 0137   K 3.9 08/09/2022 0137   CL 106 08/09/2022 0137   CO2 18 (L) 08/09/2022 0137   GLUCOSE 311 (H) 08/09/2022 0137   BUN 21 (H) 08/09/2022 0137   CREATININE 0.96 08/09/2022 0137   CALCIUM 7.7 (L) 08/09/2022 0137   GFRNONAA >60 08/09/2022 0137   GFRAA >60 01/15/2020 0836    INR No results found for: "INR"   Intake/Output Summary (Last 24 hours) at 08/13/2022 9432 Last data filed at 08/12/2022 2004 Gross per 24 hour  Intake 120 ml  Output 125 ml  Net -5 ml      Assessment/Plan:  57 y.o. female is s/p:  4/4- Left SFA and above knee popliteal artery mechanical thrombectomy, with angioplasty and stenting of left above knee popliteal artery and SFA   4/8- Right SFA and above knee popliteal artery laser atherectomy, with DCBA of the right above knee popliteal artery and SFA   4/10- bilateral TMAs with Dr.Duda  5 Days Post-Op   -pt continues to have brisk peroneal doppler signals -wound vac to bilateral feet per Dr. Lajoyce Corners -TOC working  on SNF to bridge to home -she is not on sq heparin or Lovenox.  Will order sq heparin -discussed with pt and her BP control has been good with her weight loss over the past year.  She has not been on her ACEI/diuretic hospitalized with good BP control.  Will dc this on her med list.   Doreatha Massed, PA-C [facility] [phone] 08/13/2022 6:39 AM  I have seen and evaluated the patient. I agree with the PA note as documented above.  Has undergone bilateral lower extremity revascularization this admission.  Awaiting discharge to SNF.  Aspirin Plavix statin.  VAC per Dr. Lajoyce Corners to to both TMA sites.  Cephus Shelling, MD [facility] [place] Office: [phone]

## 2022-08-14 LAB — GLUCOSE, CAPILLARY
Glucose-Capillary: 132 mg/dL — ABNORMAL HIGH (ref 70–99)
Glucose-Capillary: 144 mg/dL — ABNORMAL HIGH (ref 70–99)
Glucose-Capillary: 134 mg/dL — ABNORMAL HIGH (ref 70–99)

## 2022-08-14 NOTE — Discharge Instructions (Addendum)
PT recommendation: Patient(pt) tolerates treatment well. PT provides education on use of [facility] shoes. Pt simulates car transfer with [facility] shoes, is able to transfer with sliding board to level surface and uphill without bearing weight through lower extremities. Pt is aware she is able to bear weight through heels if necessary for transfers only, however NWB is preferable. PT recommends HHPT, pt declines the need for a longer sliding board at this time. Pt has all necessary DME. General transfer comment: pt transfers from bed to recliner at even level, later transfers uphill from recliner to bed with sliding board. Pt does not require use of heels during these transfers but is aware she can utilize heel WB if necessary to complete more difficult transfers.

## 2022-08-14 NOTE — TOC Progression Note (Signed)
Transition of Care [facility]) - Progression Note    Patient Details  Name: Kayla Schmitt MRN: [id number] Date of Birth: May 07, 1965  Transition of Care [facility]) CM/SW Contact  Eduard Roux, [place] Phone Number: 08/14/2022, 1:35 PM  Clinical Narrative:    CSW called patient- informed of bed offers- patient states she has received education and feels more confident an wants to discharge home with home health.   CSW updated medical team.  Antony Blackbird, MSW, LCSW Clinical Social Worker     Barriers to Discharge: Barriers Resolved  Expected Discharge Plan and Services   Discharge Planning Services: CM Consult Post Acute Care Choice: Durable Medical Equipment, Home Health Living arrangements for the past 2 months: Single Family Home                 DME Arranged: Bedside commode, Wheelchair manual, Other see comment DME Agency: [employer] Date DME Agency Contacted: 08/09/22 Time DME Agency Contacted: [phone] Representative spoke with at DME Agency: Belenda Cruise HH Arranged: PT HH Agency: [name] Date [facility] Agency Contacted: 08/10/22 Time HH Agency Contacted: 1154 Representative spoke with at [facility] Agency: Bjorn Loser   Social Determinants of Health (SDOH) Interventions SDOH Screenings   Food Insecurity: No Food Insecurity (08/03/2022)  Housing: Low Risk  (08/03/2022)  Transportation Needs: No Transportation Needs (08/03/2022)  Utilities: Not At Risk (08/03/2022)  Tobacco Use: Medium Risk (08/08/2022)    Readmission Risk Interventions     No data to display

## 2022-08-14 NOTE — Progress Notes (Signed)
[facility] [place]  Subjective  -no complaints.  Awaiting SNF placement.   Objective (!) 121/57 82 99.3 F (37.4 C) (Oral) 19 98%  Intake/Output Summary (Last 24 hours) at 08/14/2022 0623 Last data filed at 08/14/2022 0556 Gross per 24 hour  Intake 120 ml  Output 1000 ml  Net -880 ml    Right peroneal brisk above the VAC dressing Left PT brisk above the VAC dressing Bilateral TMA's with vacs  Laboratory Lab Results: No results for input(s): "WBC", "HGB", "HCT", "PLT" in the last 72 hours. BMET No results for input(s): "NA", "K", "CL", "CO2", "GLUCOSE", "BUN", "CREATININE", "CALCIUM" in the last 72 hours.  COAG No results found for: "INR", "PROTIME" No results found for: "PTT"  Assessment/Planning:  57 year old female admitted with bilateral lower extremity tissue loss with nonhealing toe amputations.  Has since undergone bilateral lower extremity endovascular revascularizations.  She has brisk Doppler signals above her dressings.  Aspirin statin Plavix at discharge.  Bilateral TMA's by Dr. Lajoyce Corners with [facility] in place.  Awaiting SNF placement as she does not feel she can take care of herself at home.  Discussed I will generally plan to see her about 3 to 4 weeks after hospital discharge with noninvasive imaging including bilateral lower extremity arterial duplex and ABI.  She will have follow-up with Dr. Lajoyce Corners for her TMA's.  Cephus Shelling 08/14/2022 6:23 AM --

## 2022-08-14 NOTE — TOC Transition Note (Signed)
Transition of Care (TOC) - CM/SW Discharge Note Donn Pierini RN, BSN Transitions of Care Unit 4E- RN Case Manager See Treatment Team for direct phone #   Patient Details  Name: Kayla Schmitt MRN: [id number] Date of Birth: 1965-11-03  Transition of Care [facility]) CM/SW Contact:  Darrold Span, RN Phone Number: 08/14/2022, 4:37 PM   Clinical Narrative:    CSW notified staff that pt has declined SNF and wants to return home w/ HH.  Pt has needed DME, and CM reached out to [facility] liaison to confirm they can still service for HHPT needs.   [facility] HHPT confirmed- they will contact pt on discharge to schedule.   CM spoke with pt and mom at bedside, reviewed [facility] plans w/ [facility], no further DME needs noted pt to follow if longer slide board needed.   Mom to transport home after PT works with pt   Final next level of care: Home w Home Health Services Barriers to Discharge: Barriers Resolved   Patient Goals and CMS Choice CMS Medicare.gov Compare Post Acute Care list provided to:: Patient Choice offered to / list presented to : Patient  Discharge Placement    Home w/ [facility]                     Discharge Plan and Services Additional resources added to the After Visit Summary for     Discharge Planning Services: CM Consult Post Acute Care Choice: Durable Medical Equipment, Home Health          DME Arranged: Bedside commode, Wheelchair manual, Other see comment DME Agency: [employer] Date DME Agency Contacted: 08/09/22 Time DME Agency Contacted: 1445 Representative spoke with at DME Agency: Belenda Cruise HH Arranged: PT HH Agency: [name] Date [facility] Agency Contacted: 08/10/22 Time HH Agency Contacted: 1154 Representative spoke with at [facility] Agency: Bjorn Loser  Social Determinants of Health (SDOH) Interventions SDOH Screenings   Food Insecurity: No Food Insecurity (08/03/2022)  Housing: Low Risk  (08/03/2022)  Transportation Needs: No Transportation Needs  (08/03/2022)  Utilities: Not At Risk (08/03/2022)  Tobacco Use: Medium Risk (08/08/2022)     Readmission Risk Interventions    08/14/2022    4:37 PM  Readmission Risk Prevention Plan  Post Dischage Appt Complete  Medication Screening Complete  Transportation Screening Complete

## 2022-08-14 NOTE — Progress Notes (Signed)
Mobility Specialist: Progress Note   08/14/22 1020  Mobility  Activity  (W/C mobility)  Level of Assistance Standby assist, set-up cues, supervision of patient - no hands on  Assistive Device Wheelchair  Distance Ambulated (ft) 400 ft (W/C mobility)  RLE Weight Bearing NWB  LLE Weight Bearing NWB  Activity Response Tolerated well  Mobility Referral Yes  $Mobility charge 1 Mobility   Pt received in the bed and agreeable to mobility. Pt able to complete AP transfer into the wheelchair with standby assist. Stopped several times during mobility d/t BUE fatigue. No c/o pain, dizziness, or SOB. Pt sitting in the wheelchair after returning to the room with call bell and phone in reach.   Kylar Leonhardt Mobility Specialist Please contact via SecureChat or Rehab office at [phone]

## 2022-08-14 NOTE — Progress Notes (Signed)
Pt now wanting to go home with [facility] services.  PT working with pt this afternoon with post op shoe with transfers otherwise non weight bearing.  RN will print their recommendations and give with AVS.    No narcotics prescribed as pt has not taken any in hospital and does not want any narcotics.    Pt with f/u with Dr. Chestine Spore in 3 weeks with BLE arterial duplex and ABI.  She has f/u appt with Dr. Lajoyce Corners on 4/22.   Doreatha Massed, [facility] 08/14/2022 4:06 PM

## 2022-08-14 NOTE — Progress Notes (Signed)
Physical Therapy Treatment Patient Details Name: Kayla Schmitt MRN: [id number] DOB: [birth year] Today's Date: 08/14/2022   History of Present Illness 57 yo female admitted 4/4 for aortogram with Lt SFA thrombectomy and angioplasty due to bil non healing toe amputations (performed 07/20/22). 4/8 Rt SFA atherectomy and angioplasty. 4/10 bil transmet amp. PMHx: IDDM, GERD, HTN, PAD    PT Comments    Pt tolerates treatment well. PT provides education on use of [facility] 2 shoes. Pt simulates car transfer with [facility] shoes, is able to transfer with sliding board to level surface and uphill without bearing weight through lower extremities. Pt is aware she is able to bear weight through heels if necessary for transfers only, however NWB is preferable. PT recommends HHPT, pt declines the need for a longer sliding board at this time. Pt has all necessary DME.   Recommendations for follow up therapy are one component of a multi-disciplinary discharge planning process, led by the attending physician.  Recommendations may be updated based on patient status, additional functional criteria and insurance authorization.  Follow Up Recommendations  Can patient physically be transported by private vehicle: No    Assistance Recommended at Discharge Intermittent Supervision/Assistance  Patient can return home with the following A little help with walking and/or transfers;A little help with bathing/dressing/bathroom;Assistance with cooking/housework;Assist for transportation;Help with stairs or ramp for entrance   Equipment Recommendations   (pt owns necessary DME, declines need for longer sliding board)    Recommendations for Other Services       Precautions / Restrictions Precautions Precautions: Other (comment) Precaution Comments: bil LE VAC Restrictions Weight Bearing Restrictions: Yes RLE Weight Bearing: Non weight bearing LLE Weight Bearing: Non weight bearing Other Position/Activity Restrictions:  Per ortho: ideally NWB, can WB through heel in [facility] shoes if necessary for transfers only     Mobility  Bed Mobility Overal bed mobility: Needs Assistance Bed Mobility: Supine to Sit     Supine to sit: Independent          Transfers Overall transfer level: Needs assistance Equipment used: Sliding board Transfers: Bed to chair/wheelchair/BSC            Lateral/Scoot Transfers: Supervision General transfer comment: pt transfers from bed to recliner at even level, later transfers uphill from recliner to bed with sliding board. Pt does not require use of heels during these transfers but is aware she can utilize heel WB if necessary to complete more difficult transfers.    Ambulation/Gait                   Stairs             Wheelchair Mobility    Modified Rankin (Stroke Patients Only)       Balance Overall balance assessment: Needs assistance Sitting-balance support: Feet supported, No upper extremity supported Sitting balance-Leahy Scale: Good                                      Cognition Arousal/Alertness: Awake/alert Behavior During Therapy: WFL for tasks assessed/performed Overall Cognitive Status: Within Functional Limits for tasks assessed                                          Exercises      General Comments General comments (skin integrity, edema, etc.):  VSS on RA, wound vac present on both feet      Pertinent Vitals/Pain Pain Assessment Pain Assessment: Faces Faces Pain Scale: Hurts a little bit Pain Location: BLE Pain Descriptors / Indicators: Discomfort Pain Intervention(s): Monitored during session    Home Living                          Prior Function            PT Goals (current goals can now be found in the care plan section) Acute Rehab PT Goals Patient Stated Goal: return home Progress towards PT goals: Progressing toward goals    Frequency    Min  1X/week      PT Plan Current plan remains appropriate    Co-evaluation              AM-PAC PT "6 Clicks" Mobility   Outcome Measure  Help needed turning from your back to your side while in a flat bed without using bedrails?: None Help needed moving from lying on your back to sitting on the side of a flat bed without using bedrails?: None Help needed moving to and from a bed to a chair (including a wheelchair)?: A Little Help needed standing up from a chair using your arms (e.g., wheelchair or bedside chair)?: Total Help needed to walk in hospital room?: Total Help needed climbing 3-5 steps with a railing? : Total 6 Click Score: 14    End of Session   Activity Tolerance: Patient tolerated treatment well Patient left: in bed;with call bell/phone within reach Nurse Communication: Mobility status PT Visit Diagnosis: Other abnormalities of gait and mobility (R26.89)     Time: 0981-1914 PT Time Calculation (min) (ACUTE ONLY): 16 min  Charges:  $Therapeutic Activity: 8-22 mins                     Arlyss Gandy, PT, DPT Acute Rehabilitation Office [phone]    Arlyss Gandy 08/14/2022, 4:27 PM

## 2022-08-15 ENCOUNTER — Telehealth: Payer: Self-pay | Admitting: Physician Assistant

## 2022-08-15 NOTE — Telephone Encounter (Signed)
-----   Message from Dara Lords, [place] sent at 08/14/2022  3:57 PM EDT ----- S/p 4/4- Left SFA and above knee popliteal artery mechanical thrombectomy, with angioplasty and stenting of left above knee popliteal artery and SFA   4/8- Right SFA and above knee popliteal artery laser atherectomy, with DCBA of the right above knee popliteal artery and SFA  Please have pt f/u in 3 weeks with BLE arterial duplex and ABI and see Dr. Chestine Spore.  Thanks

## 2022-08-16 ENCOUNTER — Encounter: Payer: Self-pay | Admitting: Orthopedic Surgery

## 2022-08-16 ENCOUNTER — Ambulatory Visit: Payer: Managed Care, Other (non HMO) | Admitting: Orthopedic Surgery

## 2022-08-16 DIAGNOSIS — I96 Gangrene, not elsewhere classified: Secondary | ICD-10-CM

## 2022-08-16 DIAGNOSIS — I70223 Atherosclerosis of native arteries of extremities with rest pain, bilateral legs: Secondary | ICD-10-CM

## 2022-08-16 NOTE — Progress Notes (Signed)
Office Visit Note   Patient: Kayla Schmitt           Date of Birth: 04-19-1966           MRN: [id number] Visit Date: 08/16/2022              Requested by: Medicine, [facility] Internal [address] [place],  [place] [address] PCP: Medicine, [facility] Internal  Chief Complaint  Patient presents with   Right Foot - Routine Post Op    08/08/22 bilateral transmet amputation   Left Foot - Routine Post Op      HPI: Patient is a 57 year old woman who is seen 1 week status post bilateral transmetatarsal amputation.  Assessment & Plan: Visit Diagnoses:  1. Gangrene of toe of both feet   2. Critical limb ischemia of both lower extremities     Plan: Patient will start Dial soap cleansing with dry dressing changes.  Weightbearing for transfers only.  He is a flat postoperative shoe.  Follow-Up Instructions: Return in about 1 week (around 08/23/2022).   Ortho Exam  Patient is alert, oriented, no adenopathy, well-dressed, normal affect, normal respiratory effort. Examination there is maceration along the surgical incisions with hypergranulation tissue this was touched with silver nitrate there is no cellulitis odor or drainage.  Imaging: No results found. No images are attached to the encounter.  Labs: Lab Results  Component Value Date   HGBA1C 6.4 (H) 08/02/2022   HGBA1C 11.0 (H) 01/11/2020   ESRSEDRATE 136 (H) 01/14/2020   ESRSEDRATE 126 (H) 01/13/2020   ESRSEDRATE 125 (H) 01/12/2020   CRP 11.3 (H) 01/14/2020   CRP 13.0 (H) 01/13/2020   CRP 15.8 (H) 01/12/2020   LABURIC 7.7 (H) 02/04/2022   REPTSTATUS 08/07/2022 FINAL 08/02/2022   CULT  08/02/2022    NO GROWTH 5 DAYS Performed at [facility], [address], [place], [place] [address]      Lab Results  Component Value Date   ALBUMIN 1.6 (L) 08/06/2022   ALBUMIN 1.7 (L) 08/05/2022   ALBUMIN 1.9 (L) 08/04/2022    Lab Results  Component Value Date   MG 1.4 (L) 01/13/2020   No results found for: "VD25OH"  No  results found for: "PREALBUMIN"    Latest Ref Rng & Units 08/07/2022    2:38 AM 08/06/2022    6:15 PM 08/06/2022    1:19 AM  CBC EXTENDED  WBC 4.0 - 10.5 K/uL 11.1   11.9   RBC 3.87 - 5.11 MIL/uL 2.84   2.32   Hemoglobin 12.0 - 15.0 g/dL 8.2  9.3  6.5   HCT 82.9 - 46.0 % 24.8  28.0  19.9   Platelets 150 - 400 K/uL 335   352      There is no height or weight on file to calculate BMI.  Orders:  No orders of the defined types were placed in this encounter.  No orders of the defined types were placed in this encounter.    Procedures: No procedures performed  Clinical Data: No additional findings.  ROS:  All other systems negative, except as noted in the HPI. Review of Systems  Objective: Vital Signs: There were no vitals taken for this visit.  Specialty Comments:  No specialty comments available.  PMFS History: Patient Active Problem List   Diagnosis Date Noted   Critical limb ischemia of both lower extremities 06/19/2022   DM type 2 (diabetes mellitus, type 2) 01/28/2020   Hyperlipidemia 01/28/2020   Hypertension 01/28/2020  Hypothyroidism 01/28/2020   Obesity 01/28/2020   Ulcerated, foot, right, limited to breakdown of skin    Cellulitis of right foot    Peripheral arterial disease    Cellulitis 01/11/2020   Blister of leg 01/06/2020   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/28/2017   Chest pain 10/28/2017   GERD (gastroesophageal reflux disease) 10/28/2017   Past Medical History:  Diagnosis Date   Arthritis    Critical limb ischemia of both lower extremities    Diabetes mellitus without complication    type 2   GERD (gastroesophageal reflux disease)    Hypercholesteremia    Hypertension    Hypothyroidism    PAD (peripheral artery disease)    PONV (postoperative nausea and vomiting)    Thyroid disease     Family History  Problem Relation Age of Onset   Diabetes Other    CAD Other     Past Surgical History:  Procedure Laterality Date   ABDOMINAL  AORTOGRAM W/LOWER EXTREMITY Bilateral 01/14/2020   Procedure: ABDOMINAL AORTOGRAM W/LOWER EXTREMITY;  Surgeon: Cephus Shelling, MD;  Location: [facility] INVASIVE CV LAB;  Service: Cardiovascular;  Laterality: Bilateral;   ABDOMINAL AORTOGRAM W/LOWER EXTREMITY N/A 07/05/2022   Procedure: ABDOMINAL AORTOGRAM W/LOWER EXTREMITY;  Surgeon: Cephus Shelling, MD;  Location: [facility] CV LAB;  Service: Cardiovascular;  Laterality: N/A;   ABDOMINAL AORTOGRAM W/LOWER EXTREMITY N/A 08/02/2022   Procedure: ABDOMINAL AORTOGRAM W/LOWER EXTREMITY;  Surgeon: Cephus Shelling, MD;  Location: [facility];  Service: Cardiovascular;  Laterality: N/A;   ABDOMINAL AORTOGRAM W/LOWER EXTREMITY N/A 08/06/2022   Procedure: ABDOMINAL AORTOGRAM W/LOWER EXTREMITY;  Surgeon: Cephus Shelling, MD;  Location: [facility];  Service: Cardiovascular;  Laterality: N/A;   AMPUTATION Bilateral 07/20/2022   Procedure: AMPUTATION RIGHT GREAT TOE AND SECOND TOE, AMPUTATION LEFT GREAT TOE;  Surgeon: Nadara Mustard, MD;  Location: [facility] OR;  Service: Orthopedics;  Laterality: Bilateral;   AMPUTATION Bilateral 08/08/2022   Procedure: BILATERAL TRANSMETATARSAL AMPUTATION;  Surgeon: Nadara Mustard, MD;  Location: [facility] OR;  Service: Orthopedics;  Laterality: Bilateral;   AMPUTATION TOE     PERIPHERAL VASCULAR ATHERECTOMY Right 01/14/2020   Procedure: PERIPHERAL VASCULAR ATHERECTOMY;  Surgeon: Cephus Shelling, MD;  Location: [facility] INVASIVE CV LAB;  Service: Cardiovascular;  Laterality: Right;  sfa   PERIPHERAL VASCULAR INTERVENTION Right 01/14/2020   Procedure: PERIPHERAL VASCULAR INTERVENTION;  Surgeon: Cephus Shelling, MD;  Location: [facility];  Service: Cardiovascular;  Laterality: Right;  sfa stent    PERIPHERAL VASCULAR INTERVENTION  07/05/2022   Procedure: PERIPHERAL VASCULAR INTERVENTION;  Surgeon: Cephus Shelling, MD;  Location: [facility];  Service: Cardiovascular;;   PERIPHERAL VASCULAR  INTERVENTION  08/02/2022   Procedure: PERIPHERAL VASCULAR INTERVENTION;  Surgeon: Cephus Shelling, MD;  Location: [facility];  Service: Cardiovascular;;   PERIPHERAL VASCULAR INTERVENTION  08/06/2022   Procedure: PERIPHERAL VASCULAR INTERVENTION;  Surgeon: Cephus Shelling, MD;  Location: [facility] INVASIVE CV LAB;  Service: Cardiovascular;;   PERIPHERAL VASCULAR THROMBECTOMY  08/02/2022   Procedure: PERIPHERAL VASCULAR THROMBECTOMY;  Surgeon: Cephus Shelling, MD;  Location: [facility];  Service: Cardiovascular;;   SKIN SPLIT GRAFT Right 03/18/2020   Procedure: SKIN GRAFTING RIGHT FOOT ULCER;  Surgeon: Nadara Mustard, MD;  Location: [facility] OR;  Service: Orthopedics;  Laterality: Right;   WISDOM TOOTH EXTRACTION     Social History   Occupational History   Not on file  Tobacco Use  Smoking status: Former    Types: Cigarettes    Quit date: 01/2017    Years since quitting: 5.5   Smokeless tobacco: Never  Vaping Use   Vaping Use: Never used  Substance and Sexual Activity   Alcohol use: Not Currently   Drug use: Yes    Types: Marijuana    Comment: last use 03/12/20 - uses twice a month per pt 03/16/20   Sexual activity: Yes    Birth control/protection: Post-menopausal

## 2022-08-17 ENCOUNTER — Telehealth: Payer: Self-pay

## 2022-08-17 NOTE — Telephone Encounter (Signed)
Kayla Schmitt, PT with [name] called requesting verbal orders for PT weekly x 3 wks and a clarification on ASA admin instructions.  Reviewed pt's chart, returned call for clarification, two identifiers used. Verbal orders given. Pt taking ASA 81 mg daily, instructed her to continue. Confirmed understanding.

## 2022-08-18 ENCOUNTER — Emergency Department (HOSPITAL_COMMUNITY): Payer: Managed Care, Other (non HMO)

## 2022-08-18 ENCOUNTER — Encounter (HOSPITAL_COMMUNITY): Payer: Self-pay

## 2022-08-18 ENCOUNTER — Inpatient Hospital Stay (HOSPITAL_COMMUNITY)
Admission: EM | Admit: 2022-08-18 | Discharge: 2022-08-27 | DRG: 280 | Disposition: A | Discharge status: Home Health | Payer: Managed Care, Other (non HMO) | Attending: Family Medicine | Admitting: Family Medicine

## 2022-08-18 ENCOUNTER — Other Ambulatory Visit: Payer: Self-pay

## 2022-08-18 DIAGNOSIS — K219 Gastro-esophageal reflux disease without esophagitis: Secondary | ICD-10-CM | POA: Diagnosis present

## 2022-08-18 DIAGNOSIS — E1151 Type 2 diabetes mellitus with diabetic peripheral angiopathy without gangrene: Secondary | ICD-10-CM | POA: Diagnosis present

## 2022-08-18 DIAGNOSIS — I70203 Unspecified atherosclerosis of native arteries of extremities, bilateral legs: Secondary | ICD-10-CM | POA: Diagnosis present

## 2022-08-18 DIAGNOSIS — E1122 Type 2 diabetes mellitus with diabetic chronic kidney disease: Secondary | ICD-10-CM | POA: Diagnosis present

## 2022-08-18 DIAGNOSIS — D649 Anemia, unspecified: Secondary | ICD-10-CM | POA: Diagnosis not present

## 2022-08-18 DIAGNOSIS — Z8249 Family history of ischemic heart disease and other diseases of the circulatory system: Secondary | ICD-10-CM

## 2022-08-18 DIAGNOSIS — E119 Type 2 diabetes mellitus without complications: Secondary | ICD-10-CM | POA: Diagnosis not present

## 2022-08-18 DIAGNOSIS — I222 Subsequent non-ST elevation (NSTEMI) myocardial infarction: Secondary | ICD-10-CM | POA: Diagnosis not present

## 2022-08-18 DIAGNOSIS — Z95820 Peripheral vascular angioplasty status with implants and grafts: Secondary | ICD-10-CM | POA: Diagnosis not present

## 2022-08-18 DIAGNOSIS — E7800 Pure hypercholesterolemia, unspecified: Secondary | ICD-10-CM | POA: Diagnosis present

## 2022-08-18 DIAGNOSIS — R0602 Shortness of breath: Secondary | ICD-10-CM | POA: Diagnosis present

## 2022-08-18 DIAGNOSIS — E039 Hypothyroidism, unspecified: Secondary | ICD-10-CM | POA: Diagnosis present

## 2022-08-18 DIAGNOSIS — J9601 Acute respiratory failure with hypoxia: Secondary | ICD-10-CM | POA: Diagnosis present

## 2022-08-18 DIAGNOSIS — I5023 Acute on chronic systolic (congestive) heart failure: Secondary | ICD-10-CM | POA: Diagnosis present

## 2022-08-18 DIAGNOSIS — I340 Nonrheumatic mitral (valve) insufficiency: Secondary | ICD-10-CM

## 2022-08-18 DIAGNOSIS — I130 Hypertensive heart and chronic kidney disease with heart failure and stage 1 through stage 4 chronic kidney disease, or unspecified chronic kidney disease: Secondary | ICD-10-CM | POA: Diagnosis present

## 2022-08-18 DIAGNOSIS — Z6838 Body mass index (BMI) 38.0-38.9, adult: Secondary | ICD-10-CM

## 2022-08-18 DIAGNOSIS — N1832 Chronic kidney disease, stage 3b: Secondary | ICD-10-CM | POA: Diagnosis present

## 2022-08-18 DIAGNOSIS — I10 Essential (primary) hypertension: Secondary | ICD-10-CM | POA: Diagnosis present

## 2022-08-18 DIAGNOSIS — Z794 Long term (current) use of insulin: Secondary | ICD-10-CM | POA: Diagnosis not present

## 2022-08-18 DIAGNOSIS — Z89432 Acquired absence of left foot: Secondary | ICD-10-CM | POA: Diagnosis not present

## 2022-08-18 DIAGNOSIS — E1169 Type 2 diabetes mellitus with other specified complication: Secondary | ICD-10-CM | POA: Diagnosis not present

## 2022-08-18 DIAGNOSIS — Z79890 Hormone replacement therapy (postmenopausal): Secondary | ICD-10-CM

## 2022-08-18 DIAGNOSIS — E669 Obesity, unspecified: Secondary | ICD-10-CM | POA: Diagnosis present

## 2022-08-18 DIAGNOSIS — Z79899 Other long term (current) drug therapy: Secondary | ICD-10-CM

## 2022-08-18 DIAGNOSIS — I272 Pulmonary hypertension, unspecified: Secondary | ICD-10-CM | POA: Diagnosis present

## 2022-08-18 DIAGNOSIS — Z881 Allergy status to other antibiotic agents status: Secondary | ICD-10-CM

## 2022-08-18 DIAGNOSIS — N179 Acute kidney failure, unspecified: Secondary | ICD-10-CM | POA: Diagnosis not present

## 2022-08-18 DIAGNOSIS — Z888 Allergy status to other drugs, medicaments and biological substances status: Secondary | ICD-10-CM

## 2022-08-18 DIAGNOSIS — Z951 Presence of aortocoronary bypass graft: Secondary | ICD-10-CM

## 2022-08-18 DIAGNOSIS — D62 Acute posthemorrhagic anemia: Secondary | ICD-10-CM | POA: Diagnosis present

## 2022-08-18 DIAGNOSIS — D631 Anemia in chronic kidney disease: Secondary | ICD-10-CM | POA: Diagnosis present

## 2022-08-18 DIAGNOSIS — Z89431 Acquired absence of right foot: Secondary | ICD-10-CM

## 2022-08-18 DIAGNOSIS — I509 Heart failure, unspecified: Secondary | ICD-10-CM

## 2022-08-18 DIAGNOSIS — Z87891 Personal history of nicotine dependence: Secondary | ICD-10-CM | POA: Diagnosis not present

## 2022-08-18 DIAGNOSIS — E118 Type 2 diabetes mellitus with unspecified complications: Secondary | ICD-10-CM

## 2022-08-18 DIAGNOSIS — Z7982 Long term (current) use of aspirin: Secondary | ICD-10-CM

## 2022-08-18 DIAGNOSIS — Z833 Family history of diabetes mellitus: Secondary | ICD-10-CM

## 2022-08-18 DIAGNOSIS — I252 Old myocardial infarction: Secondary | ICD-10-CM

## 2022-08-18 DIAGNOSIS — R7989 Other specified abnormal findings of blood chemistry: Secondary | ICD-10-CM | POA: Diagnosis not present

## 2022-08-18 DIAGNOSIS — I5021 Acute systolic (congestive) heart failure: Secondary | ICD-10-CM | POA: Diagnosis not present

## 2022-08-18 DIAGNOSIS — I255 Ischemic cardiomyopathy: Secondary | ICD-10-CM | POA: Diagnosis present

## 2022-08-18 DIAGNOSIS — D638 Anemia in other chronic diseases classified elsewhere: Secondary | ICD-10-CM | POA: Diagnosis not present

## 2022-08-18 DIAGNOSIS — Z880 Allergy status to penicillin: Secondary | ICD-10-CM

## 2022-08-18 DIAGNOSIS — I739 Peripheral vascular disease, unspecified: Secondary | ICD-10-CM | POA: Diagnosis not present

## 2022-08-18 DIAGNOSIS — I350 Nonrheumatic aortic (valve) stenosis: Secondary | ICD-10-CM | POA: Diagnosis not present

## 2022-08-18 DIAGNOSIS — I214 Non-ST elevation (NSTEMI) myocardial infarction: Principal | ICD-10-CM | POA: Diagnosis present

## 2022-08-18 DIAGNOSIS — I2510 Atherosclerotic heart disease of native coronary artery without angina pectoris: Secondary | ICD-10-CM | POA: Diagnosis present

## 2022-08-18 DIAGNOSIS — I700 Atherosclerosis of aorta: Secondary | ICD-10-CM | POA: Diagnosis present

## 2022-08-18 DIAGNOSIS — Z7902 Long term (current) use of antithrombotics/antiplatelets: Secondary | ICD-10-CM

## 2022-08-18 DIAGNOSIS — I3139 Other pericardial effusion (noninflammatory): Secondary | ICD-10-CM | POA: Diagnosis not present

## 2022-08-18 LAB — CBC WITH DIFFERENTIAL/PLATELET
Abs Immature Granulocytes: 0.04 10*3/uL (ref 0.00–0.07)
Basophils Absolute: 0 10*3/uL (ref 0.0–0.1)
Basophils Relative: 1 %
Eosinophils Absolute: 0.1 10*3/uL (ref 0.0–0.5)
Eosinophils Relative: 2 %
HCT: 20.4 % — ABNORMAL LOW (ref 36.0–46.0)
Hemoglobin: 6.1 g/dL — CL (ref 12.0–15.0)
Immature Granulocytes: 1 %
Lymphocytes Relative: 24 %
Lymphs Abs: 1.8 10*3/uL (ref 0.7–4.0)
MCH: 27.9 pg (ref 26.0–34.0)
MCHC: 29.9 g/dL — ABNORMAL LOW (ref 30.0–36.0)
MCV: 93.2 fL (ref 80.0–100.0)
Monocytes Absolute: 0.6 10*3/uL (ref 0.1–1.0)
Monocytes Relative: 9 %
Neutro Abs: 4.7 10*3/uL (ref 1.7–7.7)
Neutrophils Relative %: 63 %
Platelets: 304 10*3/uL (ref 150–400)
RBC: 2.19 MIL/uL — ABNORMAL LOW (ref 3.87–5.11)
RDW: 17.6 % — ABNORMAL HIGH (ref 11.5–15.5)
WBC: 7.3 10*3/uL (ref 4.0–10.5)
nRBC: 0 % (ref 0.0–0.2)

## 2022-08-18 LAB — BPAM RBC
Blood Product Expiration Date: 202404242359
Blood Product Expiration Date: 202405132359
ISSUE DATE / TIME: 202404201612
ISSUE DATE / TIME: 202404202023
Unit Type and Rh: 9500

## 2022-08-18 LAB — TYPE AND SCREEN

## 2022-08-18 LAB — POC OCCULT BLOOD, ED: Fecal Occult Bld: NEGATIVE

## 2022-08-18 LAB — BASIC METABOLIC PANEL
Anion gap: 10 (ref 5–15)
BUN: 38 mg/dL — ABNORMAL HIGH (ref 6–20)
CO2: 22 mmol/L (ref 22–32)
Calcium: 8.2 mg/dL — ABNORMAL LOW (ref 8.9–10.3)
Chloride: 105 mmol/L (ref 98–111)
Creatinine, Ser: 1.28 mg/dL — ABNORMAL HIGH (ref 0.44–1.00)
GFR, Estimated: 49 mL/min — ABNORMAL LOW (ref >60)
Glucose, Bld: 204 mg/dL — ABNORMAL HIGH (ref 70–99)
Potassium: 3.9 mmol/L (ref 3.5–5.1)
Sodium: 137 mmol/L (ref 135–145)

## 2022-08-18 LAB — PROTIME-INR
INR: 1.2 (ref 0.8–1.2)
Prothrombin Time: 15.1 seconds (ref 11.4–15.2)

## 2022-08-18 LAB — TROPONIN I (HIGH SENSITIVITY)
Troponin I (High Sensitivity): 1096 ng/L (ref <18)
Troponin I (High Sensitivity): 1323 ng/L (ref <18)

## 2022-08-18 LAB — GLUCOSE, CAPILLARY: Glucose-Capillary: 127 mg/dL — ABNORMAL HIGH (ref 70–99)

## 2022-08-18 LAB — CBG MONITORING, ED: Glucose-Capillary: 184 mg/dL — ABNORMAL HIGH (ref 70–99)

## 2022-08-18 LAB — BRAIN NATRIURETIC PEPTIDE: B Natriuretic Peptide: 486 pg/mL — ABNORMAL HIGH (ref 0.0–100.0)

## 2022-08-18 LAB — PREPARE RBC (CROSSMATCH)

## 2022-08-18 MED ORDER — GABAPENTIN 300 MG PO CAPS
300.0000 mg | ORAL_CAPSULE | Freq: Every day | ORAL | Status: DC
  Administered 2022-08-18 – 2022-08-26 (×9): 300 mg via ORAL
  Filled 2022-08-18 (×9): qty 1

## 2022-08-18 MED ORDER — FAMOTIDINE 20 MG PO TABS
20.0000 mg | ORAL_TABLET | Freq: Every day | ORAL | Status: DC
  Administered 2022-08-19 – 2022-08-27 (×9): 20 mg via ORAL
  Filled 2022-08-18 (×10): qty 1

## 2022-08-18 MED ORDER — POTASSIUM CHLORIDE 20 MEQ PO PACK: 40.0000 meq | PACK | Freq: Two times a day (BID) | ORAL | Status: DC

## 2022-08-18 MED ORDER — LEVOTHYROXINE SODIUM 75 MCG PO TABS
150.0000 ug | ORAL_TABLET | Freq: Every day | ORAL | Status: DC
  Administered 2022-08-19 – 2022-08-27 (×9): 150 ug via ORAL
  Filled 2022-08-18 (×9): qty 2

## 2022-08-18 MED ORDER — HEPARIN BOLUS VIA INFUSION
4000.0000 [IU] | Freq: Once | INTRAVENOUS | Status: AC
  Administered 2022-08-18: 4000 [IU] via INTRAVENOUS

## 2022-08-18 MED ORDER — ASPIRIN 325 MG PO TABS
325.0000 mg | ORAL_TABLET | Freq: Once | ORAL | Status: AC
  Administered 2022-08-18: 325 mg via ORAL
  Filled 2022-08-18: qty 1

## 2022-08-18 MED ORDER — ACETAMINOPHEN 325 MG PO TABS
650.0000 mg | ORAL_TABLET | Freq: Once | ORAL | Status: AC
  Administered 2022-08-18: 650 mg via ORAL
  Filled 2022-08-18: qty 2

## 2022-08-18 MED ORDER — POLYETHYLENE GLYCOL 3350 17 G PO PACK: 17.0000 g | PACK | Freq: Every day | ORAL | Status: DC | PRN

## 2022-08-18 MED ORDER — INSULIN GLARGINE-YFGN 100 UNIT/ML ~~LOC~~ SOLN
12.0000 [IU] | Freq: Every day | SUBCUTANEOUS | Status: DC
  Administered 2022-08-19 – 2022-08-26 (×9): 12 [IU] via SUBCUTANEOUS
  Filled 2022-08-18 (×11): qty 0.12

## 2022-08-18 MED ORDER — ONDANSETRON HCL 4 MG PO TABS: 4.0000 mg | ORAL_TABLET | Freq: Four times a day (QID) | ORAL | Status: DC | PRN

## 2022-08-18 MED ORDER — AMLODIPINE BESYLATE 10 MG PO TABS: 10.0000 mg | ORAL_TABLET | Freq: Every day | ORAL | Status: DC

## 2022-08-18 MED ORDER — FUROSEMIDE 40 MG PO TABS
40.0000 mg | ORAL_TABLET | Freq: Two times a day (BID) | ORAL | Status: DC
Start: 2022-08-19 — End: 2022-08-18

## 2022-08-18 MED ORDER — CLOPIDOGREL BISULFATE 75 MG PO TABS
75.0000 mg | ORAL_TABLET | Freq: Every day | ORAL | Status: DC
  Administered 2022-08-19 – 2022-08-23 (×5): 75 mg via ORAL
  Filled 2022-08-18 (×6): qty 1

## 2022-08-18 MED ORDER — ONDANSETRON HCL 4 MG/2ML IJ SOLN: 4.0000 mg | Freq: Four times a day (QID) | INTRAMUSCULAR | Status: DC | PRN

## 2022-08-18 MED ORDER — INSULIN ASPART 100 UNIT/ML IJ SOLN
0.0000 [IU] | Freq: Every day | INTRAMUSCULAR | Status: DC
  Administered 2022-08-26: 2 [IU] via SUBCUTANEOUS

## 2022-08-18 MED ORDER — PANTOPRAZOLE SODIUM 40 MG PO TBEC
40.0000 mg | DELAYED_RELEASE_TABLET | Freq: Every day | ORAL | Status: DC
  Administered 2022-08-19 – 2022-08-27 (×9): 40 mg via ORAL
  Filled 2022-08-18 (×9): qty 1

## 2022-08-18 MED ORDER — IOHEXOL 350 MG/ML SOLN
75.0000 mL | Freq: Once | INTRAVENOUS | Status: AC | PRN
  Administered 2022-08-18: 75 mL via INTRAVENOUS

## 2022-08-18 MED ORDER — INSULIN DEGLUDEC 100 UNIT/ML ~~LOC~~ SOPN: 12.0000 [IU] | PEN_INJECTOR | Freq: Every day | SUBCUTANEOUS | Status: DC

## 2022-08-18 MED ORDER — INSULIN ASPART 100 UNIT/ML IJ SOLN
0.0000 [IU] | Freq: Three times a day (TID) | INTRAMUSCULAR | Status: DC
  Administered 2022-08-19: 3 [IU] via SUBCUTANEOUS
  Administered 2022-08-20: 2 [IU] via SUBCUTANEOUS
  Administered 2022-08-20: 3 [IU] via SUBCUTANEOUS
  Administered 2022-08-21: 2 [IU] via SUBCUTANEOUS
  Administered 2022-08-21: 3 [IU] via SUBCUTANEOUS
  Administered 2022-08-22 – 2022-08-23 (×3): 2 [IU] via SUBCUTANEOUS
  Administered 2022-08-23: 3 [IU] via SUBCUTANEOUS
  Administered 2022-08-24 – 2022-08-25 (×4): 2 [IU] via SUBCUTANEOUS
  Administered 2022-08-25 – 2022-08-26 (×2): 3 [IU] via SUBCUTANEOUS
  Administered 2022-08-26 (×2): 2 [IU] via SUBCUTANEOUS
  Administered 2022-08-27: 3 [IU] via SUBCUTANEOUS

## 2022-08-18 MED ORDER — ONDANSETRON HCL 4 MG/2ML IJ SOLN
4.0000 mg | Freq: Once | INTRAMUSCULAR | Status: AC
  Administered 2022-08-18: 4 mg via INTRAVENOUS
  Filled 2022-08-18: qty 2

## 2022-08-18 MED ORDER — FUROSEMIDE 10 MG/ML IJ SOLN
20.0000 mg | Freq: Once | INTRAMUSCULAR | Status: AC
  Administered 2022-08-18: 20 mg via INTRAVENOUS
  Filled 2022-08-18: qty 2

## 2022-08-18 MED ORDER — SODIUM CHLORIDE 0.9% IV SOLUTION: Freq: Once | INTRAVENOUS | Status: AC

## 2022-08-18 MED ORDER — POTASSIUM CHLORIDE CRYS ER 20 MEQ PO TBCR
40.0000 meq | EXTENDED_RELEASE_TABLET | Freq: Two times a day (BID) | ORAL | Status: AC
  Administered 2022-08-19 – 2022-08-22 (×8): 40 meq via ORAL
  Filled 2022-08-18 (×8): qty 2

## 2022-08-18 MED ORDER — ATORVASTATIN CALCIUM 40 MG PO TABS
40.0000 mg | ORAL_TABLET | Freq: Every day | ORAL | Status: DC
  Administered 2022-08-19 – 2022-08-27 (×9): 40 mg via ORAL
  Filled 2022-08-18 (×9): qty 1

## 2022-08-18 MED ORDER — ASPIRIN 81 MG PO TBEC
81.0000 mg | DELAYED_RELEASE_TABLET | Freq: Every day | ORAL | Status: DC
  Administered 2022-08-19 – 2022-08-27 (×9): 81 mg via ORAL
  Filled 2022-08-18 (×9): qty 1

## 2022-08-18 MED ORDER — FUROSEMIDE 10 MG/ML IJ SOLN
40.0000 mg | Freq: Two times a day (BID) | INTRAMUSCULAR | Status: DC
  Administered 2022-08-19 – 2022-08-20 (×4): 40 mg via INTRAVENOUS
  Filled 2022-08-18 (×5): qty 4

## 2022-08-18 MED ORDER — HEPARIN (PORCINE) 25000 UT/250ML-% IV SOLN
1100.0000 [IU]/h | INTRAVENOUS | Status: DC
  Administered 2022-08-18: 900 [IU]/h via INTRAVENOUS
  Filled 2022-08-18: qty 250

## 2022-08-18 NOTE — ED Provider Notes (Signed)
Physical Exam  BP (!) 98/56   Pulse 87   Temp 97.9 F (36.6 C) (Oral)   Resp 20   Ht  (1.626 m)   Wt 90.7 kg   SpO2 94%   BMI 34.33 kg/m   Physical Exam  Procedures  .Critical Care  Performed by: Derwood Kaplan, MD Authorized by: Derwood Kaplan, MD   Critical care provider statement:    Critical care time (minutes):  30   Critical care was necessary to treat or prevent imminent or life-threatening deterioration of the following conditions:  Cardiac failure and circulatory failure   Critical care was time spent personally by me on the following activities:  Development of treatment plan with patient or surrogate, discussions with consultants, evaluation of patient's response to treatment, examination of patient, ordering and review of laboratory studies, ordering and review of radiographic studies, ordering and performing treatments and interventions, pulse oximetry, re-evaluation of patient's condition and review of old charts   ED Course / MDM   Clinical Course as of 08/18/22 1739  Sat Aug 18, 2022  1432 Patient with recent transmetatarsal amputations here with shortness of breath.  She does endorse that she needed blood transfusion during her last hospitalization and she did have some bleeding from her wounds recently.  She denies any chest pain.  EKG looks ischemic and her first troponin is elevated.  Hemoglobin also critically low at 6.1 so we will transfuse.  Like will need cardiology involvement although she does not sound actively ischemic at this time.  Anticipate will need admission to the hospital for further management. [MB]  1555 Presented with shortness of breath worse with lying down since coming from home from the hospital, she has some mild lower extremity edema that she relates to having her legs dependent because she has to sleep sitting up.  PE study done due to patient's report of hypoxia at home to 89% and fast heart rate at home with wrist fracture of  recent surgery.  No PE but does show mild to moderate pleural effusions, trace pericardial effusion, pulmonary edema.  20 mg of Lasix ordered, will monitor I&O, still waiting on cardiology callback.  That was 6.1, some diffuse EKG ischemic changes, thought to be likely due to demand ischemia patient is having no current chest pain or shortness of breath.  Troponin is elevated which is why cardiology was consulted, pending delta troponin.  Rectal exam guaiac negative [CB]    Clinical Course User Index [CB] Ma Rings, PA-C [MB] Terrilee Files, MD   Medical Decision Making Amount and/or Complexity of Data Reviewed Labs: ordered. Radiology: ordered.  Risk OTC drugs. Prescription drug management. Decision regarding hospitalization.   I independently assessed the patient, and answered all the questions.  Patient has no known history of CHF.  She states that she was just discharged from the hospital and has been having shortness of breath since then.  Her symptoms are primarily orthopnea and paroxysmal nocturnal dyspnea, but since yesterday she has been having shortness of breath even at rest.  I have reviewed the findings with her 1 more time at the request of patient's family member.  Patient has anemia that is acute, and her chest x-ray shows pulmonary edema.  EKG has some subtle ST depression.  Patient has not had any chest pain.  Her high-sensitivity troponins have been elevated and rising.  My initial thought is that patient likely has new onset CHF which could be because of her severe anemia  or structural heart disease -including from ischemic cardiomyopathy.  Rising troponins could be because of additional stress on the heart from the anemia and the CHF.  Patient's O2 sats noted to be 91% at room air.  I have advised the nursing staff to start patient on oxygen to maintain O2 sats over 94%.  Also requested to restart aspirin.  No need for heparin at this time given that there  is anemia.  Patient does not appear to be actively bleeding.  She denies any GI blood loss.  Repeat EKG was ordered.  No acute changes noted.   EKG Interpretation  Date/Time:  Saturday August 18 2022 17:18:11 EDT Ventricular Rate:  85 PR Interval:  182 QRS Duration: 103 QT Interval:  370 QTC Calculation: 440 R Axis:   30 Text Interpretation: Sinus rhythm Borderline repolarization abnormality No acute changes ST depression is not new Confirmed by Derwood Kaplan [phone]) on 08/18/2022 5:41:08 PM            Derwood Kaplan, MD 08/18/22 6045

## 2022-08-18 NOTE — H&P (Signed)
History and Physical    Patient: Kayla Schmitt ZOX:[id number] DOB: May 30, 1965 DOA: 08/18/2022 DOS: the patient was seen and examined on 08/18/2022 PCP: Medicine, [facility] Internal  Patient coming from: Home  Chief Complaint:  Chief Complaint  Patient presents with   Shortness of Breath   HPI: Kayla Schmitt is a 57 y.o. female with medical history significant of type 2 diabetes on insulin, hypertension, hypothyroidism, peripheral artery disease on antiplatelet therapy, GERD, chronic anemia.  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  The patient reports having increased bleeding when she went home from the surgical incision sites.  She reports bleeding a fair amount, but did not come back to the hospital for evaluation.  Few days ago, the bleeding stopped and has remained hemostatic for the past several days.  She has been noting increased difficulty breathing and shortness of breath.  She is having difficulty laying down flat.  She reports a vague sensation of feeling off, but no severe chest pain, nausea, vomiting, fevers, chills.  She also reports no hematemesis, melena, hematochezia or rectal bleeding.  She is menopausal.  Review of Systems: As mentioned in the history of present illness. All other systems reviewed and are negative. Past Medical History:  Diagnosis Date   Arthritis    Critical limb ischemia of both lower extremities    Diabetes mellitus without complication    type 2   GERD (gastroesophageal reflux disease)    Hypercholesteremia    Hypertension    Hypothyroidism    PAD (peripheral artery disease)    PONV (postoperative nausea and vomiting)    Thyroid disease    Past Surgical History:  Procedure Laterality Date   ABDOMINAL AORTOGRAM W/LOWER EXTREMITY Bilateral 01/14/2020   Procedure: ABDOMINAL AORTOGRAM W/LOWER EXTREMITY;  Surgeon: Cephus Shelling, MD;  Location: [facility] INVASIVE CV LAB;  Service:  Cardiovascular;  Laterality: Bilateral;   ABDOMINAL AORTOGRAM W/LOWER EXTREMITY N/A 07/05/2022   Procedure: ABDOMINAL AORTOGRAM W/LOWER EXTREMITY;  Surgeon: Cephus Shelling, MD;  Location: [facility] IN[facility] CV [facility];  Service: Cardiovascular;  Laterality: N/A;   ABDOMINAL AORTOGRAM W/LOWER EXTREMITY N/A 08/02/2022   Procedure: ABDOMINAL AORTOGRAM W/LOWER EXTREMITY;  Surgeon: Cephus Shelling, MD;  Location: [facility];  Service: Cardiovascular;  Laterality: N/A;   ABDOMINAL AORTOGRAM W/LOWER EXTREMITY N/A 08/06/2022   Procedure: ABDOMINAL AORTOGRAM W/LOWER EXTREMITY;  Surgeon: Cephus Shelling, MD;  Location: [facility];  Service: Cardiovascular;  Laterality: N/A;   AMPUTATION Bilateral 07/20/2022   Procedure: AMPUTATION RIGHT GREAT TOE AND SECOND TOE, AMPUTATION LEFT GREAT TOE;  Surgeon: Nadara Mustard, MD;  Location: [facility] OR;  Service: Orthopedics;  Laterality: Bilateral;   AMPUTATION Bilateral 08/08/2022   Procedure: BILATERAL TRANSMETATARSAL AMPUTATION;  Surgeon: Nadara Mustard, MD;  Location: [facility] OR;  Service: Orthopedics;  Laterality: Bilateral;   AMPUTATION TOE     PERIPHERAL VASCULAR ATHERECTOMY Right 01/14/2020   Procedure: PERIPHERAL VASCULAR ATHERECTOMY;  Surgeon: Cephus Shelling, MD;  Location: [facility] INVASIVE CV LAB;  Service: Cardiovascular;  Laterality: Right;  sfa   PERIPHERAL VASCULAR INTERVENTION Right 01/14/2020   Procedure: PERIPHERAL VASCULAR INTERVENTION;  Surgeon: Cephus Shelling, MD;  Location: [facility];  Service: Cardiovascular;  Laterality: Right;  sfa stent    PERIPHERAL VASCULAR INTERVENTION  07/05/2022   Procedure: PERIPHERAL VASCULAR INTERVENTION;  Surgeon: Cephus Shelling, MD;  Location: [facility];  Service: Cardiovascular;;   PERIPHERAL VASCULAR INTERVENTION  08/02/2022   Procedure:  PERIPHERAL VASCULAR INTERVENTION;  Surgeon: Cephus Shelling, MD;  Location: [facility] INVASIVE CV LAB;  Service: Cardiovascular;;   PERIPHERAL VASCULAR  INTERVENTION  08/06/2022   Procedure: PERIPHERAL VASCULAR INTERVENTION;  Surgeon: Cephus Shelling, MD;  Location: [facility] INVASIVE CV LAB;  Service: Cardiovascular;;   PERIPHERAL VASCULAR THROMBECTOMY  08/02/2022   Procedure: PERIPHERAL VASCULAR THROMBECTOMY;  Surgeon: Cephus Shelling, MD;  Location: [facility];  Service: Cardiovascular;;   SKIN SPLIT GRAFT Right 03/18/2020   Procedure: SKIN GRAFTING RIGHT FOOT ULCER;  Surgeon: Nadara Mustard, MD;  Location: [facility] OR;  Service: Orthopedics;  Laterality: Right;   WISDOM TOOTH EXTRACTION     Social History:  reports that she quit smoking about 5 years ago. Her smoking use included cigarettes. She has never used smokeless tobacco. She reports that she does not currently use alcohol. She reports current drug use. Drug: Marijuana.  Allergies  Allergen Reactions   Doxycycline Nausea And Vomiting    Extremely N/V   Trental [Pentoxifylline] Nausea And Vomiting   Penicillins Rash    Tolerated cephalosporins 07/2022     Family History  Problem Relation Age of Onset   Diabetes Other    CAD Other     Prior to Admission medications   Medication Sig Start Date End Date Taking? Authorizing Provider  acetaminophen (TYLENOL) 500 MG tablet Take 500-1,000 mg by mouth every 6 (six) hours as needed for moderate pain.   Yes [provider]  amLODipine (NORVASC) 10 MG tablet Take 10 mg by mouth daily.   Yes [provider]  aspirin 81 MG EC tablet Take 1 tablet by mouth daily.   Yes [provider]  atorvastatin (LIPITOR) 40 MG tablet Take 40 mg by mouth daily. 12/14/19  Yes [provider]  clopidogrel (PLAVIX) 75 MG tablet TAKE 1 TABLET BY MOUTH EVERY DAY WITH BREAKFAST Patient taking differently: Take 75 mg by mouth daily. 02/05/22  Yes Maeola Harman, MD  diphenhydrAMINE HCl, Sleep, (ZZZQUIL) 25 MG CAPS Take 25 mg by mouth at bedtime as needed (Sleep).   Yes [provider]  famotidine  (PEPCID) 20 MG tablet Take 20 mg by mouth daily.   Yes [provider]  gabapentin (NEURONTIN) 300 MG capsule Take 300 mg by mouth at bedtime.   Yes [provider]  ibuprofen (ADVIL) 600 MG tablet Take 1 tablet (600 mg total) by mouth every 6 (six) hours as needed. 02/04/22  Yes Small, Brooke L, PA  insulin degludec (TRESIBA FLEXTOUCH) 100 UNIT/ML FlexTouch Pen Inject 10 Units into the skin at bedtime. Patient taking differently: Inject 12-18 Units into the skin at bedtime. 01/15/20  Yes Danford, Earl Lites, MD  levothyroxine (SYNTHROID) 150 MCG tablet Take 150 mcg by mouth daily. 12/02/19  Yes [provider]  pantoprazole (PROTONIX) 40 MG tablet Take 40 mg by mouth daily. 12/21/19  Yes [provider]  polyethylene glycol (MIRALAX / GLYCOLAX) 17 g packet Take 17 g by mouth daily.   Yes [provider]  zinc sulfate 220 (50 Zn) MG capsule Take 1 capsule (220 mg total) by mouth daily for 9 days. 08/13/22 08/22/22 Yes Rhyne, Ames Coupe, PA-C  nitroGLYCERIN (NITRODUR - DOSED IN MG/24 HR) 0.2 mg/hr patch Place 1 patch (0.2 mg total) onto the skin daily. Patient not taking: Reported on 08/18/2022 07/03/22   Nadara Mustard, MD  pentoxifylline (TRENTAL) 400 MG CR tablet Take 1 tablet (400 mg total) by mouth 3 (three) times daily with  meals. Patient not taking: Reported on 08/18/2022 07/03/22   Nadara Mustard, MD  Semaglutide, 1 MG/DOSE, (OZEMPIC, 1 MG/DOSE,) 2 MG/1.5ML SOPN Inject 1 mg into the skin every Saturday.    [provider]    Physical Exam: Vitals:   08/18/22 1634 08/18/22 1730 08/18/22 1800 08/18/22 1830  BP: (!) 98/56 109/64 107/63 (!) 117/56  Pulse: 87 (!) 115 84   Resp: 20 (!) 24 (!) 25 (!) 28  Temp: 97.9 F (36.6 C)     TempSrc: Oral     SpO2: 94% 97% 95%   Weight:      Height:       General: Middle-age female. Awake and alert and oriented x3. No acute cardiopulmonary distress.  HEENT: Normocephalic atraumatic.  Right and left ears  normal in appearance.  Pupils equal, round, reactive to light. Extraocular muscles are intact. Sclerae anicteric and noninjected.  Moist mucosal membranes. No mucosal lesions.  Neck: Neck supple without lymphadenopathy. No carotid bruits. No masses palpated.  Cardiovascular: Regular rate with normal S1-S2 sounds. No murmurs, rubs, gallops auscultated. No JVD.  Respiratory: Good respiratory effort with no wheezes, rales, rhonchi. Lungs clear to auscultation bilaterally.  No accessory muscle use. Abdomen: Soft, nontender, nondistended. Active bowel sounds. No masses or hepatosplenomegaly  Skin: No rashes, lesions, or ulcerations.  Dry, warm to touch. 2+ dorsalis pedis and radial pulses. Musculoskeletal: No calf or leg pain. All major joints not erythematous nontender.  No upper or lower joint deformation.  Good ROM.  No contractures  Psychiatric: Intact judgment and insight. Pleasant and cooperative. Neurologic: No focal neurological deficits. Strength is 5/5 and symmetric in upper and lower extremities.  Cranial nerves II through XII are grossly intact.  Data Reviewed: Results for orders placed or performed during the hospital encounter of 08/18/22 (from the past 24 hour(s))  Basic metabolic panel     Status: Abnormal   Collection Time: 08/18/22  1:44 PM  Result Value Ref Range   Sodium 137 135 - 145 mmol/L   Potassium 3.9 3.5 - 5.1 mmol/L   Chloride 105 98 - 111 mmol/L   CO2 22 22 - 32 mmol/L   Glucose, Bld 204 (H) 70 - 99 mg/dL   BUN 38 (H) 6 - 20 mg/dL   Creatinine, Ser 1.61 (H) 0.44 - 1.00 mg/dL   Calcium 8.2 (L) 8.9 - 10.3 mg/dL   GFR, Estimated 49 (L) >60 mL/min   Anion gap 10 5 - 15  Brain natriuretic peptide     Status: Abnormal   Collection Time: 08/18/22  1:44 PM  Result Value Ref Range   B Natriuretic Peptide 486.0 (H) 0.0 - 100.0 pg/mL  CBC with Differential     Status: Abnormal   Collection Time: 08/18/22  1:44 PM  Result Value Ref Range   WBC 7.3 4.0 - 10.5 K/uL   RBC  2.19 (L) 3.87 - 5.11 MIL/uL   Hemoglobin 6.1 (LL) 12.0 - 15.0 g/dL   HCT 09.6 (L) 04.5 - 40.9 %   MCV 93.2 80.0 - 100.0 fL   MCH 27.9 26.0 - 34.0 pg   MCHC 29.9 (L) 30.0 - 36.0 g/dL   RDW 81.1 (H) 91.4 - 78.2 %   Platelets 304 150 - 400 K/uL   nRBC 0.0 0.0 - 0.2 %   Neutrophils Relative % 63 %   Neutro Abs 4.7 1.7 - 7.7 K/uL   Lymphocytes Relative 24 %   Lymphs Abs 1.8 0.7 - 4.0 K/uL  Monocytes Relative 9 %   Monocytes Absolute 0.6 0.1 - 1.0 K/uL   Eosinophils Relative 2 %   Eosinophils Absolute 0.1 0.0 - 0.5 K/uL   Basophils Relative 1 %   Basophils Absolute 0.0 0.0 - 0.1 K/uL   Immature Granulocytes 1 %   Abs Immature Granulocytes 0.04 0.00 - 0.07 K/uL  Troponin I (High Sensitivity)     Status: Abnormal   Collection Time: 08/18/22  1:44 PM  Result Value Ref Range   Troponin I (High Sensitivity) 1,096 (HH) <18 ng/L  Type and screen [facility]     Status: None (Preliminary result)   Collection Time: 08/18/22  2:02 PM  Result Value Ref Range   ABO/RH(D) O POS    Antibody Screen NEG    Sample Expiration 08/21/2022,2359    Unit Number [id number]    Blood Component Type RED CELLS,LR    Unit division 00    Status of Unit ISSUED    Transfusion Status OK TO TRANSFUSE    Crossmatch Result      Compatible Performed at [facility], [address], [place], [place] [address]   Protime-INR     Status: None   Collection Time: 08/18/22  2:02 PM  Result Value Ref Range   Prothrombin Time 15.1 11.4 - 15.2 seconds   INR 1.2 0.8 - 1.2  CBG monitoring, ED     Status: Abnormal   Collection Time: 08/18/22  2:23 PM  Result Value Ref Range   Glucose-Capillary 184 (H) 70 - 99 mg/dL  POC occult blood, ED     Status: None   Collection Time: 08/18/22  2:58 PM  Result Value Ref Range   Fecal Occult Bld NEGATIVE NEGATIVE  Troponin I (High Sensitivity)     Status: Abnormal   Collection Time: 08/18/22  4:07 PM  Result Value Ref Range   Troponin I (High Sensitivity) 1,323 (HH)  <18 ng/L    CT Angio Chest PE W and/or Wo Contrast  Result Date: 08/18/2022 CLINICAL DATA:  Pulmonary embolism (PE) suspected, high prob EXAM: CT ANGIOGRAPHY CHEST WITH CONTRAST TECHNIQUE: Multidetector CT imaging of the chest was performed using the standard protocol during bolus administration of intravenous contrast. Multiplanar CT image reconstructions and MIPs were obtained to evaluate the vascular anatomy. RADIATION DOSE REDUCTION: This exam was performed according to the departmental dose-optimization program which includes automated exposure control, adjustment of the mA and/or kV according to patient size and/or use of iterative reconstruction technique. CONTRAST:  75mL OMNIPAQUE IOHEXOL 350 MG/ML SOLN COMPARISON:  X-ray 08/18/2022 FINDINGS: Cardiovascular: Satisfactory opacification of the pulmonary arteries to the segmental level. No evidence of pulmonary embolism. Thoracic aorta is nonaneurysmal. Atherosclerotic calcifications of the aorta and coronary arteries. Normal heart size. Trace pericardial effusion. Mediastinum/Nodes: Mildly enlarged mediastinal and bilateral hilar lymph nodes, largest measuring up to 1.1 cm short axis in the precarinal space (series 6, image 40). No axillary lymphadenopathy. Thyroid gland, trachea, and esophagus demonstrate no significant abnormality. Lungs/Pleura: Small-moderate layering bilateral pleural effusions. Probable compressive atelectasis versus consolidation within the dependent aspects of the right and left lower lobe. Bilateral perihilar ground-glass opacities with interlobular septal thickening. No pneumothorax. Upper Abdomen: No acute abnormality. Musculoskeletal: No chest wall abnormality. No acute or significant osseous findings. Review of the MIP images confirms the above findings. IMPRESSION: 1. No evidence of pulmonary embolism. 2. Findings suggestive of pulmonary edema with small-moderate layering bilateral pleural effusions. 3. Probable compressive  atelectasis versus consolidation within the dependent aspects of the  lung bases. 4. Mildly enlarged mediastinal and bilateral hilar lymph nodes, likely reactive. 5. Trace pericardial effusion. 6. Aortic and coronary artery atherosclerosis (ICD10-I70.0). Electronically Signed   By: Duanne Guess D.O.   On: 08/18/2022 15:35   DG Chest 2 View  Result Date: 08/18/2022 CLINICAL DATA:  Shortness of breath EXAM: CHEST - 2 VIEW COMPARISON:  None Available. FINDINGS: Normal cardiopericardial silhouette bilateral interstitial changes. Tiny effusions. No pneumothorax or consolidation IMPRESSION: Bilateral interstitial changes with small effusions. Acute process is possible. Recommend follow-up Electronically Signed   By: Karen Kays M.D.   On: 08/18/2022 14:03     Assessment and Plan: No notes have been filed under this hospital service. Service: Hospitalist  Principal Problem:   NSTEMI (non-ST elevated myocardial infarction) Active Problems:   DM type 2 (diabetes mellitus, type 2)   Hypertension   Hypothyroidism   Acute clinical systolic heart failure   Acute blood loss anemia  NSTEMI with acute clinical systolic heart failure Due to the troponins increasing over 2 hours, this appears to be ACS. Patient not actively bleeding, so we will start heparin Will trend troponins Diurese with Lasix 40 mg twice daily Potassium replacement Check metabolic panel in the morning Echocardiogram in the morning Admit the patient at [facility] Cardiology to consult Symptomatic anemia due to acute blood loss anemia with background anemia of chronic disease. Transfuse 2 units Check H&H following transfusion No active bleeding It appears that the source of her bleeding was from her surgical wounds Hypertension Continue antihypertensives Diabetes Strict glycemic control Hypothyroidism Continue levothyroxine Status post surgery Stable surgically.   Advance Care Planning:   Code Status: Full  Code   Consults: cardiology  Family Communication: none  Severity of Illness: The appropriate patient status for this patient is INPATIENT. Inpatient status is judged to be reasonable and necessary in order to provide the required intensity of service to ensure the patient's safety. The patient's presenting symptoms, physical exam findings, and initial radiographic and laboratory data in the context of their chronic comorbidities is felt to place them at high risk for further clinical deterioration. Furthermore, it is not anticipated that the patient will be medically stable for discharge from the hospital within 2 midnights of admission.   * I certify that at the point of admission it is my clinical judgment that the patient will require inpatient hospital care spanning beyond 2 midnights from the point of admission due to high intensity of service, high risk for further deterioration and high frequency of surveillance required.*  Author: Levie Heritage, DO 08/18/2022 6:47 PM  For on call review [email or web address].

## 2022-08-18 NOTE — ED Notes (Signed)
Patient O2 sat 91 % started 2 liters oxygen

## 2022-08-18 NOTE — ED Notes (Signed)
Dr. Rhunette Croft in room with patient, reviewing plan of care and possible transfer to [facility] for cardiology eval. Patient verbalized understanding.

## 2022-08-18 NOTE — Progress Notes (Signed)
ANTICOAGULATION CONSULT NOTE - Initial Consult  Pharmacy Consult for Heparin Indication: chest pain/ACS  Allergies  Allergen Reactions   Doxycycline Nausea And Vomiting    Extremely N/V   Trental [Pentoxifylline] Nausea And Vomiting   Penicillins Rash    Tolerated cephalosporins 07/2022     Patient Measurements: Height:  (162.6 cm) Weight: 90.7 kg (200 lb) IBW/kg (Calculated) : 54.7 Heparin Dosing Weight: 75 kg  Vital Signs: Temp: 97.9 F (36.6 C) (04/20 1634) Temp Source: Oral (04/20 1634) BP: 109/64 (04/20 1730) Pulse Rate: 115 (04/20 1730)  Labs: Recent Labs    08/18/22 1344 08/18/22 1402 08/18/22 1607  HGB 6.1*  --   --   HCT 20.4*  --   --   PLT 304  --   --   LABPROT  --  15.1  --   INR  --  1.2  --   CREATININE 1.28*  --   --   TROPONINIHS 1,096*  --  1,323*    Estimated Creatinine Clearance: 54.2 mL/min (A) (by C-G formula based on SCr of 1.28 mg/dL (H)).   Medical History: Past Medical History:  Diagnosis Date   Arthritis    Critical limb ischemia of both lower extremities    Diabetes mellitus without complication    type 2   GERD (gastroesophageal reflux disease)    Hypercholesteremia    Hypertension    Hypothyroidism    PAD (peripheral artery disease)    PONV (postoperative nausea and vomiting)    Thyroid disease     Medications:  See electronic med rec  Assessment: 57 y.o. F presents with NSTEMI. To begin heparin. No AC PTA. Noted pt with Hgb down to 6.1 on admission - ED MD aware and states no active bleeding, and ok to start heparin. Plan to give PRBC x2 tonight. (Noted Hgb 6.5-9.3 over past month) - s/p b/l TMAs 4/10.  Goal of Therapy:  Heparin level 0.3-0.7 units/ml (aim for 0.3-0.5 with low Hgb) Monitor platelets by anticoagulation protocol: Yes   Plan:  Heparin IV bolus 4000 units Heparin gtt at 900 units/hr Will f/u heparin level and CBC in 6 hours. F/u Hgb closely Daily heparin level and CBC  Christoper Fabian, PharmD,  BCPS Please see amion for complete clinical pharmacist phone list 08/18/2022,6:06 PM

## 2022-08-18 NOTE — ED Notes (Signed)
Attending in room assessing pt.

## 2022-08-18 NOTE — ED Provider Notes (Signed)
[facility] EMERGENCY DEPARTMENT AT [facility] Provider Note   CSN: [id number] Arrival date & time: 08/18/22  1252     History  Chief Complaint  Patient presents with   Shortness of Breath    Kayla Schmitt is a 57 y.o. female.  Has PMH of diabetes, PAD, had recent bilateral transmetatarsal amputation at [facility].  She presents the ER today complaining of shortness of breath intermittently for the past 2 days that is worse with lying flat.  Denies any chest pain, denies fevers or sweats.  She states this started shortly after coming home from the hospital.   Aside from 1 episode of having her incision bleed shortly after getting home that stopped she has had no issues with her wounds.  She reports she had profuse bleeding from the wound and estimates she lost a "pint of blood", reports she had received 3 units of blood while hospitalized for her amputation  States the first couple episodes of dyspnea were shorter but she has been short of breath all night last night and this morning [name]  She states she is feeling normal and again now this states her oxygen has dropped to 89% a couple of times and that her heart rate has been elevated somewhat when these episodes happen.   Shortness of Breath      Home Medications Prior to Admission medications   Medication Sig Start Date End Date Taking? Authorizing Provider  acetaminophen (TYLENOL) 500 MG tablet Take 500-1,000 mg by mouth every 6 (six) hours as needed for moderate pain.    [provider]  amLODipine (NORVASC) 10 MG tablet Take 10 mg by mouth daily.    [provider]  aspirin 81 MG EC tablet Take 1 tablet by mouth 2 (two) times a week.    [provider]  atorvastatin (LIPITOR) 40 MG tablet Take 40 mg by mouth daily. 12/14/19   [provider]  clopidogrel (PLAVIX) 75 MG tablet TAKE 1 TABLET BY MOUTH EVERY DAY WITH BREAKFAST 02/05/22   Maeola Harman, MD   diphenhydrAMINE HCl, Sleep, (ZZZQUIL) 25 MG CAPS Take 25 mg by mouth at bedtime as needed (Sleep).    [provider]  famotidine (PEPCID) 20 MG tablet Take 20 mg by mouth daily.    [provider]  gabapentin (NEURONTIN) 300 MG capsule Take 300 mg by mouth daily.    [provider]  ibuprofen (ADVIL) 600 MG tablet Take 1 tablet (600 mg total) by mouth every 6 (six) hours as needed. 02/04/22   Small, Brooke L, PA  insulin degludec (TRESIBA FLEXTOUCH) 100 UNIT/ML FlexTouch Pen Inject 10 Units into the skin at bedtime. Patient taking differently: Inject 12-18 Units into the skin at bedtime. 01/15/20   Danford, Earl Lites, MD  levothyroxine (SYNTHROID) 150 MCG tablet Take 150 mcg by mouth daily. 12/02/19   [provider]  nitroGLYCERIN (NITRODUR - DOSED IN MG/24 HR) 0.2 mg/hr patch Place 1 patch (0.2 mg total) onto the skin daily. 07/03/22   Nadara Mustard, MD  pantoprazole (PROTONIX) 40 MG tablet Take 40 mg by mouth daily. 12/21/19   [provider]  pentoxifylline (TRENTAL) 400 MG CR tablet Take 1 tablet (400 mg total) by mouth 3 (three) times daily with meals. 07/03/22   Nadara Mustard, MD  Semaglutide, 1 MG/DOSE, (OZEMPIC, 1 MG/DOSE,) 2 MG/1.5ML SOPN Inject 1 mg into the skin every Saturday.    [provider]  zinc sulfate 220 (50  Zn) MG capsule Take 1 capsule (220 mg total) by mouth daily for 9 days. 08/13/22 08/22/22  Dara Lords, PA-C      Allergies    Doxycycline, Trental [pentoxifylline], and Penicillins    Review of Systems   Review of Systems  Respiratory:  Positive for shortness of breath.     Physical Exam Updated Vital Signs BP (!) 98/56   Pulse 87   Temp 97.9 F (36.6 C) (Oral)   Resp 20   Ht  (1.626 m)   Wt 90.7 kg   SpO2 94%   BMI 34.33 kg/m  Physical Exam Vitals and nursing note reviewed.  Constitutional:      General: She is not in acute distress.    Appearance: She is well-developed.  HENT:     Head:  Normocephalic and atraumatic.  Eyes:     Extraocular Movements: Extraocular movements intact.     Conjunctiva/sclera: Conjunctivae normal.     Pupils: Pupils are equal, round, and reactive to light.     Comments: Pale palpebral conjunctiva bilaterally  Cardiovascular:     Rate and Rhythm: Normal rate and regular rhythm.     Heart sounds: No murmur heard. Pulmonary:     Effort: Pulmonary effort is normal. No respiratory distress.     Breath sounds: Normal breath sounds.  Abdominal:     Palpations: Abdomen is soft.     Tenderness: There is no abdominal tenderness.  Genitourinary:    Rectum: Normal. Guaiac result negative.  Musculoskeletal:        General: No swelling.     Cervical back: Neck supple.  Skin:    General: Skin is warm and dry.     Capillary Refill: Capillary refill takes less than 2 seconds.  Neurological:     General: No focal deficit present.     Mental Status: She is alert and oriented to person, place, and time.  Psychiatric:        Mood and Affect: Mood normal.     ED Results / Procedures / Treatments   Labs (all labs ordered are listed, but only abnormal results are displayed) Labs Reviewed  BASIC METABOLIC PANEL - Abnormal; Notable for the following components:      Result Value   Glucose, Bld 204 (*)    BUN 38 (*)    Creatinine, Ser 1.28 (*)    Calcium 8.2 (*)    GFR, Estimated 49 (*)    All other components within normal limits  BRAIN NATRIURETIC PEPTIDE - Abnormal; Notable for the following components:   B Natriuretic Peptide 486.0 (*)    All other components within normal limits  CBC WITH DIFFERENTIAL/PLATELET - Abnormal; Notable for the following components:   RBC 2.19 (*)    Hemoglobin 6.1 (*)    HCT 20.4 (*)    MCHC 29.9 (*)    RDW 17.6 (*)    All other components within normal limits  CBG MONITORING, ED - Abnormal; Notable for the following components:   Glucose-Capillary 184 (*)    All other components within normal limits  TROPONIN I  (HIGH SENSITIVITY) - Abnormal; Notable for the following components:   Troponin I (High Sensitivity) 1,096 (*)    All other components within normal limits  TROPONIN I (HIGH SENSITIVITY) - Abnormal; Notable for the following components:   Troponin I (High Sensitivity) 1,323 (*)    All other components within normal limits  PROTIME-INR  OCCULT BLOOD X 1 CARD TO LAB, STOOL  POC OCCULT BLOOD, ED  TYPE AND SCREEN  PREPARE RBC (CROSSMATCH)    EKG EKG Interpretation  Date/Time:  Saturday August 18 2022 17:18:11 EDT Ventricular Rate:  85 PR Interval:  182 QRS Duration: 103 QT Interval:  370 QTC Calculation: 440 R Axis:   30 Text Interpretation: Sinus rhythm Borderline repolarization abnormality No acute changes ST depression is not new Confirmed by Derwood Kaplan [phone]) on 08/18/2022 5:41:08 PM  Radiology CT Angio Chest PE W and/or Wo Contrast  Result Date: 08/18/2022 CLINICAL DATA:  Pulmonary embolism (PE) suspected, high prob EXAM: CT ANGIOGRAPHY CHEST WITH CONTRAST TECHNIQUE: Multidetector CT imaging of the chest was performed using the standard protocol during bolus administration of intravenous contrast. Multiplanar CT image reconstructions and MIPs were obtained to evaluate the vascular anatomy. RADIATION DOSE REDUCTION: This exam was performed according to the departmental dose-optimization program which includes automated exposure control, adjustment of the mA and/or kV according to patient size and/or use of iterative reconstruction technique. CONTRAST:  75mL OMNIPAQUE IOHEXOL 350 MG/ML SOLN COMPARISON:  X-ray 08/18/2022 FINDINGS: Cardiovascular: Satisfactory opacification of the pulmonary arteries to the segmental level. No evidence of pulmonary embolism. Thoracic aorta is nonaneurysmal. Atherosclerotic calcifications of the aorta and coronary arteries. Normal heart size. Trace pericardial effusion. Mediastinum/Nodes: Mildly enlarged mediastinal and bilateral hilar lymph nodes, largest  measuring up to 1.1 cm short axis in the precarinal space (series 6, image 40). No axillary lymphadenopathy. Thyroid gland, trachea, and esophagus demonstrate no significant abnormality. Lungs/Pleura: Small-moderate layering bilateral pleural effusions. Probable compressive atelectasis versus consolidation within the dependent aspects of the right and left lower lobe. Bilateral perihilar ground-glass opacities with interlobular septal thickening. No pneumothorax. Upper Abdomen: No acute abnormality. Musculoskeletal: No chest wall abnormality. No acute or significant osseous findings. Review of the MIP images confirms the above findings. IMPRESSION: 1. No evidence of pulmonary embolism. 2. Findings suggestive of pulmonary edema with small-moderate layering bilateral pleural effusions. 3. Probable compressive atelectasis versus consolidation within the dependent aspects of the lung bases. 4. Mildly enlarged mediastinal and bilateral hilar lymph nodes, likely reactive. 5. Trace pericardial effusion. 6. Aortic and coronary artery atherosclerosis (ICD10-I70.0). Electronically Signed   By: Duanne Guess D.O.   On: 08/18/2022 15:35   DG Chest 2 View  Result Date: 08/18/2022 CLINICAL DATA:  Shortness of breath EXAM: CHEST - 2 VIEW COMPARISON:  None Available. FINDINGS: Normal cardiopericardial silhouette bilateral interstitial changes. Tiny effusions. No pneumothorax or consolidation IMPRESSION: Bilateral interstitial changes with small effusions. Acute process is possible. Recommend follow-up Electronically Signed   By: Karen Kays M.D.   On: 08/18/2022 14:03    Procedures .Critical Care  Performed by: Ma Rings, PA-C Authorized by: Ma Rings, PA-C   Critical care provider statement:    Critical care time (minutes):  30   Critical care was time spent personally by me on the following activities:  Development of treatment plan with patient or surrogate, discussions with consultants,  evaluation of patient's response to treatment, examination of patient, ordering and review of laboratory studies, ordering and review of radiographic studies, ordering and performing treatments and interventions, pulse oximetry, re-evaluation of patient's condition and review of old charts   Care discussed with: admitting provider       Medications Ordered in ED Medications  furosemide (LASIX) injection 20 mg (has no administration in time range)  aspirin tablet 325 mg (has no administration in time range)  0.9 %  sodium chloride infusion (Manually program via Guardrails IV Fluids) ( Intravenous  New Bag/Given 08/18/22 1619)  iohexol (OMNIPAQUE) 350 MG/ML injection 75 mL (75 mLs Intravenous Contrast Given 08/18/22 1510)  furosemide (LASIX) injection 20 mg (20 mg Intravenous Given 08/18/22 1554)  acetaminophen (TYLENOL) tablet 650 mg (650 mg Oral Given 08/18/22 1707)    ED Course/ Medical Decision Making/ A&P Clinical Course as of 08/18/22 1743  Sat Aug 18, 2022  1432 Patient with recent transmetatarsal amputations here with shortness of breath.  She does endorse that she needed blood transfusion during her last hospitalization and she did have some bleeding from her wounds recently.  She denies any chest pain.  EKG looks ischemic and her first troponin is elevated.  Hemoglobin also critically low at 6.1 so we will transfuse.  Like will need cardiology involvement although she does not sound actively ischemic at this time.  Anticipate will need admission to the hospital for further management. [MB]  1555 Presented with shortness of breath worse with lying down since coming from home from the hospital, she has some mild lower extremity edema that she relates to having her legs dependent because she has to sleep sitting up.  PE study done due to patient's report of hypoxia at home to 89% and fast heart rate at home with wrist fracture of recent surgery.  No PE but does show mild to moderate pleural  effusions, trace pericardial effusion, pulmonary edema.  20 mg of Lasix ordered, will monitor I&O, still waiting on cardiology callback.  That was 6.1, some diffuse EKG ischemic changes, thought to be likely due to demand ischemia patient is having no current chest pain or shortness of breath.  Troponin is elevated which is why cardiology was consulted, pending delta troponin.  Rectal exam guaiac negative [CB]    Clinical Course User Index [CB] Carmel Sacramento A, PA-C [MB] Terrilee Files, MD                             Medical Decision Making This patient presents to the ED for concern of Dyspnea, this involves an extensive number of treatment options, and is a complaint that carries with it a high risk of complications and morbidity.  The differential diagnosis includes PE, pneumonia, CHF exacerbation, pneumothorax, ACS, anemia, sepsis, other   Co morbidities that complicate the patient evaluation  Peripheral vascular disease, anemia, recent bilateral transmetatarsal amputation   Additional history obtained:  Additional history obtained from EMR External records from outside source obtained and reviewed including hospital medicine notes from recent admission   Lab Tests:  I Ordered, and personally interpreted labs.  The pertinent results include: Globin of 6.1, troponin is 1096, creatinine slightly elevated from baseline at 1.28, BNP is 486   Imaging Studies ordered:  I ordered imaging studies including CXR  I independently visualized and interpreted imaging which showed pulmonary edema I agree with the radiologist interpretation   Cardiac Monitoring: / EKG:  The patient was maintained on a cardiac monitor.  I personally viewed and interpreted the cardiac monitored which showed an underlying rhythm of: sinus rhythm. Subtle ST depressions diffusely on EKG   Consultations Obtained:  Was discussed with the cardiologist, Dr.Chandrasekhar.  We discussed the patient  including labs and imaging and he reviewed her chart as well.   Patient has new onset CHF, anemia requiring transfusion and elevated troponin.  She has bilateral pleural effusions and pulmonary edema on CT and just on the CT angio has scattered coronary calcifications diffusely.  It  is unclear if she is having slowly demand ischemia from her CHF and anemia or if there could be a component of ischemia from coronary artery disease.  Patient is getting Lasix and transfusion and is hemodynamically stable now, no active bleeding on exam.  He suggested that we could transfuse her and diurese and if she is not having being and hemoglobin rises appropriately when we recheck hemoglobin she could be started on heparin but do not necessarily need to start it immediately.  Patient will need to be admitted to the hospital, given no cardiology she will need to go to [facility].  He states cardiology would not admit her primarily but they will consult and consider cathing at some point during her stay.  Discussed with Dr. Adrian Blackwater who will admit pt   Problem List / ED Course / Critical interventions / Medication management  Anemia-being transfused New CHF-given Lasix especially given that she is being transfused, lungs are clear to auscultation, she satting 94% on room air will reevaluate periodically during transfusion to evaluate volume status Elevated troponin-repeat is pending I ordered medication including lasix  for fluid overload, PRBC for anemia  Reevaluation of the patient after these medicines showed that the patient improved I have reviewed the patients home medicines and have made adjustments as needed   Social Determinants of Health:  Living at home with her elderly mother       Amount and/or Complexity of Data Reviewed Labs: ordered. Radiology: ordered.  Risk OTC drugs. Prescription drug management. Decision regarding hospitalization.           Final Clinical Impression(s) /  ED Diagnoses Final diagnoses:  Anemia, unspecified type  Congestive heart failure, unspecified HF chronicity, unspecified heart failure type  Elevated troponin    Rx / DC Orders ED Discharge Orders     None         Josem Kaufmann 08/18/22 1743    Terrilee Files, MD 08/19/22 [phone]

## 2022-08-18 NOTE — ED Triage Notes (Addendum)
Pt presents with intermittent ShOB pt describes orthopnea and having to elevate herself at night to sleep. Pt with recent bilateral metatarsal amputations. Pt states she did hit her foot when she came home from the hospital and had significant blood loss which concerns her.

## 2022-08-18 NOTE — ED Provider Triage Note (Signed)
Emergency Medicine Provider Triage Evaluation Note  Kayla Schmitt , a 57 y.o. female  was evaluated in triage.  Pt complains of SOB intermittently since being discharged from the hospital. She had bilateral mid foot amputations at [facility] on 4/14  Review of Systems  Positive: Sob, orthopnea Negative: Chest pain  Physical Exam  BP 95/60 (BP Location: Left Arm)   Pulse 87   Temp 97.9 F (36.6 C) (Oral)   Resp 20   Ht  (1.626 m)   Wt 90.7 kg   SpO2 96%   BMI 34.33 kg/m  Gen:   Awake, no distress   Resp:  Normal effort  MSK:   Moves extremities without difficulty  Other:    Medical Decision Making  Medically screening exam initiated at 1:16 PM.  Appropriate orders placed.  Kayla Schmitt was informed that the remainder of the evaluation will be completed by another provider, this initial triage assessment does not replace that evaluation, and the importance of remaining in the ED until their evaluation is complete.     Carmel Sacramento A, [place] 08/18/22 1329

## 2022-08-19 ENCOUNTER — Inpatient Hospital Stay (HOSPITAL_COMMUNITY): Payer: Managed Care, Other (non HMO)

## 2022-08-19 DIAGNOSIS — D62 Acute posthemorrhagic anemia: Secondary | ICD-10-CM | POA: Diagnosis not present

## 2022-08-19 DIAGNOSIS — I5021 Acute systolic (congestive) heart failure: Secondary | ICD-10-CM

## 2022-08-19 DIAGNOSIS — I214 Non-ST elevation (NSTEMI) myocardial infarction: Secondary | ICD-10-CM | POA: Diagnosis not present

## 2022-08-19 DIAGNOSIS — R7989 Other specified abnormal findings of blood chemistry: Secondary | ICD-10-CM

## 2022-08-19 DIAGNOSIS — E1169 Type 2 diabetes mellitus with other specified complication: Secondary | ICD-10-CM

## 2022-08-19 DIAGNOSIS — I509 Heart failure, unspecified: Secondary | ICD-10-CM

## 2022-08-19 DIAGNOSIS — Z794 Long term (current) use of insulin: Secondary | ICD-10-CM

## 2022-08-19 DIAGNOSIS — I10 Essential (primary) hypertension: Secondary | ICD-10-CM

## 2022-08-19 DIAGNOSIS — D638 Anemia in other chronic diseases classified elsewhere: Secondary | ICD-10-CM

## 2022-08-19 DIAGNOSIS — E1151 Type 2 diabetes mellitus with diabetic peripheral angiopathy without gangrene: Secondary | ICD-10-CM

## 2022-08-19 DIAGNOSIS — R0602 Shortness of breath: Secondary | ICD-10-CM

## 2022-08-19 DIAGNOSIS — I739 Peripheral vascular disease, unspecified: Secondary | ICD-10-CM

## 2022-08-19 DIAGNOSIS — D649 Anemia, unspecified: Secondary | ICD-10-CM | POA: Diagnosis not present

## 2022-08-19 DIAGNOSIS — E039 Hypothyroidism, unspecified: Secondary | ICD-10-CM

## 2022-08-19 LAB — TYPE AND SCREEN
ABO/RH(D): O POS
ABO/RH(D): O POS
Antibody Screen: NEGATIVE
Antibody Screen: NEGATIVE
Unit division: 0
Unit division: 0

## 2022-08-19 LAB — CBC
HCT: 24.1 % — ABNORMAL LOW (ref 36.0–46.0)
Hemoglobin: 7.5 g/dL — ABNORMAL LOW (ref 12.0–15.0)
MCH: 27.8 pg (ref 26.0–34.0)
MCHC: 31.1 g/dL (ref 30.0–36.0)
MCV: 89.3 fL (ref 80.0–100.0)
Platelets: 297 10*3/uL (ref 150–400)
RBC: 2.7 MIL/uL — ABNORMAL LOW (ref 3.87–5.11)
RDW: 17.4 % — ABNORMAL HIGH (ref 11.5–15.5)
WBC: 7.3 10*3/uL (ref 4.0–10.5)
nRBC: 0 % (ref 0.0–0.2)

## 2022-08-19 LAB — BASIC METABOLIC PANEL
Anion gap: 11 (ref 5–15)
BUN: 35 mg/dL — ABNORMAL HIGH (ref 6–20)
CO2: 22 mmol/L (ref 22–32)
Calcium: 8.2 mg/dL — ABNORMAL LOW (ref 8.9–10.3)
Chloride: 103 mmol/L (ref 98–111)
Creatinine, Ser: 1.41 mg/dL — ABNORMAL HIGH (ref 0.44–1.00)
GFR, Estimated: 44 mL/min — ABNORMAL LOW (ref >60)
Glucose, Bld: 147 mg/dL — ABNORMAL HIGH (ref 70–99)
Potassium: 3.8 mmol/L (ref 3.5–5.1)
Sodium: 136 mmol/L (ref 135–145)

## 2022-08-19 LAB — ECHOCARDIOGRAM COMPLETE
AR max vel: 2.42 cm2
AV Area VTI: 2.98 cm2
AV Area mean vel: 2.38 cm2
AV Mean grad: 10 mmHg
AV Peak grad: 17.2 mmHg
AV Vena cont: 0.4 cm
Ao pk vel: 2.08 m/s
Area-P 1/2: 4.49 cm2
Calc EF: 55.5 %
Height: 64 in
MV M vel: 3.81 m/s
MV Peak grad: 58.1 mmHg
P 1/2 time: 530 msec
Radius: 1 cm
S' Lateral: 4 cm
Single Plane A2C EF: 63.3 %
Single Plane A4C EF: 47.3 %
Weight: 3555.58 oz

## 2022-08-19 LAB — VITAMIN B12: Vitamin B-12: 477 pg/mL (ref 180–914)

## 2022-08-19 LAB — IRON AND TIBC
Iron: 35 ug/dL (ref 28–170)
Saturation Ratios: 18 % (ref 10.4–31.8)
TIBC: 199 ug/dL — ABNORMAL LOW (ref 250–450)
UIBC: 164 ug/dL

## 2022-08-19 LAB — BPAM RBC: Unit Type and Rh: 5100

## 2022-08-19 LAB — FOLATE: Folate: 6.5 ng/mL (ref >5.9)

## 2022-08-19 LAB — GLUCOSE, CAPILLARY
Glucose-Capillary: 143 mg/dL — ABNORMAL HIGH (ref 70–99)
Glucose-Capillary: 173 mg/dL — ABNORMAL HIGH (ref 70–99)
Glucose-Capillary: 173 mg/dL — ABNORMAL HIGH (ref 70–99)

## 2022-08-19 LAB — HEMOGLOBIN AND HEMATOCRIT, BLOOD
HCT: 24.7 % — ABNORMAL LOW (ref 36.0–46.0)
Hemoglobin: 7.7 g/dL — ABNORMAL LOW (ref 12.0–15.0)

## 2022-08-19 LAB — HEPARIN LEVEL (UNFRACTIONATED)
Heparin Unfractionated: <0.1 IU/mL — ABNORMAL LOW (ref 0.30–0.70)
Heparin Unfractionated: <0.1 IU/mL — ABNORMAL LOW (ref 0.30–0.70)

## 2022-08-19 LAB — FERRITIN: Ferritin: 209 ng/mL (ref 11–307)

## 2022-08-19 LAB — TROPONIN I (HIGH SENSITIVITY): Troponin I (High Sensitivity): 1227 ng/L (ref <18)

## 2022-08-19 LAB — HIV ANTIBODY (ROUTINE TESTING W REFLEX): HIV Screen 4th Generation wRfx: NONREACTIVE

## 2022-08-19 LAB — RETICULOCYTES
Immature Retic Fract: 31.8 % — ABNORMAL HIGH (ref 2.3–15.9)
RBC.: 3 MIL/uL — ABNORMAL LOW (ref 3.87–5.11)
Retic Count, Absolute: 119.1 10*3/uL (ref 19.0–186.0)
Retic Ct Pct: 4 % — ABNORMAL HIGH (ref 0.4–3.1)

## 2022-08-19 MED ORDER — LIVING BETTER WITH HEART FAILURE BOOK: Freq: Once | Status: AC

## 2022-08-19 MED ORDER — ENOXAPARIN SODIUM 40 MG/0.4ML IJ SOSY
40.0000 mg | PREFILLED_SYRINGE | INTRAMUSCULAR | Status: DC
  Administered 2022-08-19 – 2022-08-26 (×7): 40 mg via SUBCUTANEOUS
  Filled 2022-08-19 (×7): qty 0.4

## 2022-08-19 MED ORDER — HEPARIN BOLUS VIA INFUSION
2000.0000 [IU] | Freq: Once | INTRAVENOUS | Status: AC
  Administered 2022-08-19: 2000 [IU] via INTRAVENOUS
  Filled 2022-08-19: qty 2000

## 2022-08-19 NOTE — Progress Notes (Signed)
[facility]                                                                               Kayla Schmitt, is a 57 y.o. female, DOB - Nov 21, 1965, EPP:[id number] Admit date - 08/18/2022    Outpatient Primary MD for the patient is Medicine, [facility]  LOS - 1  days    Brief summary   Kayla Schmitt is a 57 y.o. female with medical history significant of type 2 diabetes on insulin, hypertension, hypothyroidism, peripheral artery disease on antiplatelet therapy, GERD, chronic anemia.  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  The patient reports having increased bleeding when she went home from the surgical incision sites.  She reports bleeding a fair amount, but did not come back to the hospital for evaluation.  Few days ago, the bleeding stopped and has remained hemostatic for the past several days.  She has been noting increased difficulty breathing and shortness of breath.  She is having difficulty laying down flat.  She was admitted for NSTEMI with systolic heart failure.   Assessment & Plan    Assessment and Plan:  Acute respiratory failure with hypoxia secondary to acute systolic heart failure/ NSTEMI IV lasix 40 mg bid,  ECHO pending.  Cardiology consulted.  Elevated troponins probably from demand ischemia from CHF and anemia.  Will probably need cath in the future.  Currently on aspirin and plavix and lipitor.    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  S/p 1 unit of prbc transfusion. Post transfusion hemoglobin is 7.7 Stool for occult blood is negative yesterday.  She denies any melena or use of NSAIDS.  Anemia panel not significant.  Baseline hemoglobin  between 9.8 to  11 in 2020 and 10.1 in 01/2022.  Will request GI consultation in am.    Insulin dependent DM  CBG (last 3)  Recent Labs    08/18/22 1423 08/18/22 2218  08/19/22 1218  GLUCAP 184* 127* 143*   Resume SSI and semglee 12 units at bedtime.    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    Critical limb ischemia / PVD 4/4- Left SFA and above knee popliteal artery mechanical thrombectomy, with angioplasty and stenting of left above knee popliteal artery and SFA   4/8- Right SFA and above knee popliteal artery laser atherectomy, with DCBA of the right above knee popliteal artery and SFA. She was discharged on aspirin and plavix on 4/15 by vascular surgery.    Hypothyroidism:  Resume synthroid.    Estimated body mass index is 38.14 kg/m as calculated from the following:   Height as of this encounter:  (1.626 m).   Weight as of this encounter: 100.8 kg.  Code Status: full code.  DVT Prophylaxis:  enoxaparin (LOVENOX) injection 40 mg Start: 08/19/22 0945   Level of Care: Level of care: Progressive Family Communication: none at bedside.   Disposition Plan:     Remains inpatient appropriate:  systolic heart failure , NSTEMI.  Procedures:  Echocardiogram.   Consultants:  Cardiology.   Antimicrobials:   Anti-infectives (From admission, onward)    None        Medications  Scheduled Meds:  aspirin EC  81 mg Oral Daily   atorvastatin  40 mg Oral Daily   clopidogrel  75 mg Oral Daily   enoxaparin (LOVENOX) injection  40 mg Subcutaneous Q24H   famotidine  20 mg Oral Daily   furosemide  40 mg Intravenous BID   gabapentin  300 mg Oral QHS   insulin aspart  0-15 Units Subcutaneous TID WC   insulin aspart  0-5 Units Subcutaneous QHS   insulin glargine-yfgn  12 Units Subcutaneous QHS   levothyroxine  150 mcg Oral Q0600   pantoprazole  40 mg Oral Daily   potassium chloride  40 mEq Oral BID   Continuous Infusions: PRN Meds:.ondansetron **OR** ondansetron (ZOFRAN) IV, polyethylene glycol    Subjective:   Kayla Schmitt was seen and examined today. Sob improved, still  requiring oxygen. No nausea, vomiting. No melena.   Objective:   Vitals:   08/18/22 2340 08/19/22 0016 08/19/22 0336 08/19/22 1214  BP: 96/63 (!) 107/91 93/67 114/68  Pulse: 74 75 80 84  Resp: 20 (!) 22 18 18   Temp: (!) 97.5 F (36.4 C)  97.7 F (36.5 C) 98.3 F (36.8 C)  TempSrc: Axillary  Oral Oral  SpO2: 97% 97% 90% 93%  Weight:   100.8 kg   Height:        Intake/Output Summary (Last 24 hours) at 08/19/2022 1545 Last data filed at 08/18/2022 2340 Gross per 24 hour  Intake 687 ml  Output --  Net 687 ml   Filed Weights   08/18/22 1300 08/19/22 0336  Weight: 90.7 kg 100.8 kg     Exam General: Alert and oriented x 3, NAD Cardiovascular: S1 S2 auscultated, no murmurs, RRR Respiratory: Clear to auscultation bilaterally, no wheezing, rales or rhonchi Gastrointestinal: Soft, nontender, nondistended, + bowel sounds Ext: feet wrapped.  Neuro: AAOx3, Cr N's II- XII. Strength 5/5 upper and lower extremities bilaterally Skin: No rashes Psych: Normal affect and demeanor, alert and oriented x3    Data Reviewed:  I have personally reviewed following labs and imaging studies   CBC Lab Results  Component Value Date   WBC 7.3 08/19/2022   RBC 3.00 (L) 08/19/2022   HGB 7.7 (L) 08/19/2022   HCT 24.7 (L) 08/19/2022   MCV 89.3 08/19/2022   MCH 27.8 08/19/2022   PLT 297 08/19/2022   MCHC 31.1 08/19/2022   RDW 17.4 (H) 08/19/2022   LYMPHSABS 1.8 08/18/2022   MONOABS 0.6 08/18/2022   EOSABS 0.1 08/18/2022   BASOSABS 0.0 08/18/2022     Last metabolic panel Lab Results  Component Value Date   NA 136 08/19/2022   K 3.8 08/19/2022   CL 103 08/19/2022   CO2 22 08/19/2022   BUN 35 (H) 08/19/2022   CREATININE 1.41 (H) 08/19/2022   GLUCOSE 147 (H) 08/19/2022   GFRNONAA 44 (L) 08/19/2022   GFRAA >60 01/15/2020   CALCIUM 8.2 (L) 08/19/2022   PROT 5.4 (L) 08/06/2022   ALBUMIN 1.6 (L) 08/06/2022   BILITOT 0.7 08/06/2022   ALKPHOS 207 (H) 08/06/2022   AST 17 08/06/2022    ALT 14 08/06/2022   ANIONGAP 11 08/19/2022    CBG (last 3)  Recent Labs    08/18/22 1423 08/18/22 2218 08/19/22 1218  GLUCAP 184* 127* 143*      Coagulation Profile: Recent Labs  Lab 08/18/22 1402  INR  1.2     Radiology Studies: CT Angio Chest PE W and/or Wo Contrast  Result Date: 08/18/2022 CLINICAL DATA:  Pulmonary embolism (PE) suspected, high prob EXAM: CT ANGIOGRAPHY CHEST WITH CONTRAST TECHNIQUE: Multidetector CT imaging of the chest was performed using the standard protocol during bolus administration of intravenous contrast. Multiplanar CT image reconstructions and MIPs were obtained to evaluate the vascular anatomy. RADIATION DOSE REDUCTION: This exam was performed according to the departmental dose-optimization program which includes automated exposure control, adjustment of the mA and/or kV according to patient size and/or use of iterative reconstruction technique. CONTRAST:  75mL OMNIPAQUE IOHEXOL 350 MG/ML SOLN COMPARISON:  X-ray 08/18/2022 FINDINGS: Cardiovascular: Satisfactory opacification of the pulmonary arteries to the segmental level. No evidence of pulmonary embolism. Thoracic aorta is nonaneurysmal. Atherosclerotic calcifications of the aorta and coronary arteries. Normal heart size. Trace pericardial effusion. Mediastinum/Nodes: Mildly enlarged mediastinal and bilateral hilar lymph nodes, largest measuring up to 1.1 cm short axis in the precarinal space (series 6, image 40). No axillary lymphadenopathy. Thyroid gland, trachea, and esophagus demonstrate no significant abnormality. Lungs/Pleura: Small-moderate layering bilateral pleural effusions. Probable compressive atelectasis versus consolidation within the dependent aspects of the right and left lower lobe. Bilateral perihilar ground-glass opacities with interlobular septal thickening. No pneumothorax. Upper Abdomen: No acute abnormality. Musculoskeletal: No chest wall abnormality. No acute or significant osseous  findings. Review of the MIP images confirms the above findings. IMPRESSION: 1. No evidence of pulmonary embolism. 2. Findings suggestive of pulmonary edema with small-moderate layering bilateral pleural effusions. 3. Probable compressive atelectasis versus consolidation within the dependent aspects of the lung bases. 4. Mildly enlarged mediastinal and bilateral hilar lymph nodes, likely reactive. 5. Trace pericardial effusion. 6. Aortic and coronary artery atherosclerosis (ICD10-I70.0). Electronically Signed   By: Duanne Guess D.O.   On: 08/18/2022 15:35   DG Chest 2 View  Result Date: 08/18/2022 CLINICAL DATA:  Shortness of breath EXAM: CHEST - 2 VIEW COMPARISON:  None Available. FINDINGS: Normal cardiopericardial silhouette bilateral interstitial changes. Tiny effusions. No pneumothorax or consolidation IMPRESSION: Bilateral interstitial changes with small effusions. Acute process is possible. Recommend follow-up Electronically Signed   By: Karen Kays M.D.   On: 08/18/2022 14:03       Kathlen Mody M.D. [facility] 08/19/2022, 3:45 PM  Available via Epic secure chat 7am-7pm After 7 pm, please refer to night coverage provider listed on amion.

## 2022-08-19 NOTE — Progress Notes (Addendum)
ANTICOAGULATION CONSULT NOTE - Follow up  Pharmacy Consult for Heparin Indication: chest pain/ACS  Allergies  Allergen Reactions   Doxycycline Nausea And Vomiting    Extremely N/V   Trental [Pentoxifylline] Nausea And Vomiting   Penicillins Rash    Tolerated cephalosporins 07/2022     Patient Measurements: Height:  (162.6 cm) Weight: 100.8 kg (222 lb 3.6 oz) IBW/kg (Calculated) : 54.7 Heparin Dosing Weight: 75.1 kg  Vital Signs: Temp: 97.7 F (36.5 C) (04/21 0336) Temp Source: Oral (04/21 0336) BP: 93/67 (04/21 0336) Pulse Rate: 80 (04/21 0336)  Labs: Recent Labs    08/18/22 1344 08/18/22 1402 08/18/22 1607 08/19/22 0228 08/19/22 0229  HGB 6.1*  --   --   --  7.5*  HCT 20.4*  --   --   --  24.1*  PLT 304  --   --   --  297  LABPROT  --  15.1  --   --   --   INR  --  1.2  --   --   --   HEPARINUNFRC  --   --   --  <0.10*  --   CREATININE 1.28*  --   --  1.41*  --   TROPONINIHS 1,096*  --  1,323* 1,227*  --      Estimated Creatinine Clearance: 52 mL/min (A) (by C-G formula based on SCr of 1.41 mg/dL (H)).   Medical History: Past Medical History:  Diagnosis Date   Arthritis    Critical limb ischemia of both lower extremities    Diabetes mellitus without complication    type 2   GERD (gastroesophageal reflux disease)    Hypercholesteremia    Hypertension    Hypothyroidism    PAD (peripheral artery disease)    PONV (postoperative nausea and vomiting)    Thyroid disease     Medications:  See electronic med rec  Assessment: 57 y.o. F presents with NSTEMI. To begin heparin. No AC PTA. Noted pt with Hgb down to 6.1 on admission - ED MD aware and states no active bleeding, and ok to start heparin. Plan to give PRBC x2 tonight. (Noted Hgb 6.5-9.3 over past month) - s/p b/l TMAs 4/10.  Goal of Therapy:  Heparin level 0.3-0.7 units/ml (aim for 0.3-0.5 with low Hgb) Monitor platelets by anticoagulation protocol: Yes  4/21 AM Update HL subtherapeutic  at <0.1 after 4000 unit bolus and infusion rate of 900units/hr. Patient received 2 units of blood but no signs of bleeding noted.  Hgb 6.1>>7.5, Plts 304>>297  Plan:  Heparin bolus of 2000 units x 1 dose Increase heparin infusion to 1100units/hr Recheck HL in 6 hours Monitor CBC and for signs/symptoms of bleeding  Fredirick Lathe, Student Pharmacist

## 2022-08-19 NOTE — Plan of Care (Signed)
  Problem: Education: Goal: Knowledge of the prescribed therapeutic regimen will improve Outcome: Progressing Goal: Ability to verbalize activity precautions or restrictions will improve Outcome: Progressing Goal: Understanding of discharge needs will improve Outcome: Progressing   Problem: Activity: Goal: Ability to perform//tolerate increased activity and mobilize with assistive devices will improve Outcome: Progressing   Problem: Clinical Measurements: Goal: Postoperative complications will be avoided or minimized Outcome: Progressing   Problem: Self-Care: Goal: Ability to meet self-care needs will improve Outcome: Progressing   Problem: Self-Concept: Goal: Ability to maintain and perform role responsibilities to the fullest extent possible will improve Outcome: Progressing

## 2022-08-19 NOTE — Consult Note (Signed)
Cardiology Consultation   Patient ID: Kayla Schmitt MRN: [id number]; DOB: [birth year]  Admit date: 08/18/2022 Date of Consult: 08/19/2022  PCP:  [facility], [facility]   [facility] Providers Cardiologist:  New to [facility]  Patient Profile:   Kayla Schmitt is a 57 y.o. 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 Adrian Blackwater.  History of Present Illness:   Kayla Schmitt presented to [facility] ED on 08/18/2022 for evaluation of worsening dyspnea on exertion and orthopnea. In talking with the patient today, she reports she was overall doing well upon returning home from her surgery but developed worsening orthopnea and PND for the past 3 days. Says she was unable to sleep in her bed at night has been sleeping while sitting straight up. Has also noticed lower extremity edema but felt like this was secondary to her recent surgery and not being able to elevate her lower extremities. She denies any specific abdominal distention. No recent chest pain or palpitations. No reported melena, hematochezia or hematuria. Has been using a wheelchair for ambulation since her surgery.   Initial labs show WBC 7.3, Hgb 6.1 (previously at 8.2 on 08/07/2022), platelets 304, Na+ 137, K+ 3.9 and creatinine 1.28 (baseline 0.9 - 1.1).  BNP elevated to 486.  Fecal occult blood negative. Initial Hs Troponin 1096 with repeat values of 1323 and 1227. CXR showed bilateral interstitial changes with small pleural effusions. CTA showed no evidence of a PE but was noted to have pulmonary edema with small to  moderate layering bilateral effusions and probable compressive atelectasis versus consolidation in the lung bases.  Was also noted to have mildly enlarged lymph nodes and a trace pericardial effusion along with aortic and coronary atherosclerosis. EKG showed normal sinus rhythm, heart rate 85 with PAC's and slight ST depression along the inferior and lateral leads which was more prominent when compared to prior tracings.  She was transferred from [facility] to [facility] for further evaluation.  Received 2 units PRBC's and was started on Heparin by the Hospitalist team. CBC this morning shows her hemoglobin remains low at 7.5. Was also started on IV Lasix and received 20mg  x2 with plans to start 40mg  BID today. Creatinine has increased to 1.41. Echocardiogram pending.  Past Medical History:  Diagnosis Date   Arthritis    Critical limb ischemia of both lower extremities    Diabetes mellitus without complication    type 2   GERD (gastroesophageal reflux disease)    Hypercholesteremia    Hypertension    Hypothyroidism    PAD (peripheral artery disease)    PONV (postoperative nausea and vomiting)    Thyroid disease     Past Surgical History:  Procedure Laterality Date   ABDOMINAL AORTOGRAM W/LOWER EXTREMITY Bilateral 01/14/2020   Procedure: ABDOMINAL AORTOGRAM W/LOWER EXTREMITY;  Surgeon: Cephus Shelling, MD;  Location: [facility] INVASIVE CV [facility];  Service: Cardiovascular;  Laterality: Bilateral;   ABDOMINAL AORTOGRAM W/LOWER EXTREMITY N/A 07/05/2022   Procedure: ABDOMINAL AORTOGRAM W/LOWER EXTREMITY;  Surgeon: Cephus Shelling, MD;  Location: [facility];  Service: Cardiovascular;  Laterality: N/A;   ABDOMINAL AORTOGRAM  W/LOWER EXTREMITY N/A 08/02/2022   Procedure: ABDOMINAL AORTOGRAM W/LOWER EXTREMITY;  Surgeon: Cephus Shelling, MD;  Location: [facility] INVASIVE CV LAB;  Service: Cardiovascular;  Laterality: N/A;   ABDOMINAL AORTOGRAM W/LOWER EXTREMITY N/A 08/06/2022   Procedure: ABDOMINAL  AORTOGRAM W/LOWER EXTREMITY;  Surgeon: Cephus Shelling, MD;  Location: [facility];  Service: Cardiovascular;  Laterality: N/A;   AMPUTATION Bilateral 07/20/2022   Procedure: AMPUTATION RIGHT GREAT TOE AND SECOND TOE, AMPUTATION LEFT GREAT TOE;  Surgeon: Nadara Mustard, MD;  Location: [facility] OR;  Service: Orthopedics;  Laterality: Bilateral;   AMPUTATION Bilateral 08/08/2022   Procedure: BILATERAL TRANSMETATARSAL AMPUTATION;  Surgeon: Nadara Mustard, MD;  Location: [facility] OR;  Service: Orthopedics;  Laterality: Bilateral;   AMPUTATION TOE     PERIPHERAL VASCULAR ATHERECTOMY Right 01/14/2020   Procedure: PERIPHERAL VASCULAR ATHERECTOMY;  Surgeon: Cephus Shelling, MD;  Location: [facility] INVASIVE CV LAB;  Service: Cardiovascular;  Laterality: Right;  sfa   PERIPHERAL VASCULAR INTERVENTION Right 01/14/2020   Procedure: PERIPHERAL VASCULAR INTERVENTION;  Surgeon: Cephus Shelling, MD;  Location: [facility];  Service: Cardiovascular;  Laterality: Right;  sfa stent    PERIPHERAL VASCULAR INTERVENTION  07/05/2022   Procedure: PERIPHERAL VASCULAR INTERVENTION;  Surgeon: Cephus Shelling, MD;  Location: [facility];  Service: Cardiovascular;;   PERIPHERAL VASCULAR INTERVENTION  08/02/2022   Procedure: PERIPHERAL VASCULAR INTERVENTION;  Surgeon: Cephus Shelling, MD;  Location: [facility];  Service: Cardiovascular;;   PERIPHERAL VASCULAR INTERVENTION  08/06/2022   Procedure: PERIPHERAL VASCULAR INTERVENTION;  Surgeon: Cephus Shelling, MD;  Location: [facility] INVASIVE CV LAB;  Service: Cardiovascular;;   PERIPHERAL VASCULAR THROMBECTOMY  08/02/2022   Procedure: PERIPHERAL VASCULAR THROMBECTOMY;  Surgeon: Cephus Shelling, MD;  Location: [facility];  Service: Cardiovascular;;   SKIN SPLIT GRAFT Right 03/18/2020   Procedure: SKIN GRAFTING RIGHT FOOT ULCER;  Surgeon: Nadara Mustard, MD;  Location: [facility] OR;  Service: Orthopedics;  Laterality: Right;   WISDOM TOOTH EXTRACTION        Home Medications:  Prior to Admission medications   Medication Sig Start Date End Date Taking? Authorizing Provider  acetaminophen (TYLENOL) 500 MG tablet Take 500-1,000 mg by mouth every 6 (six) hours as needed for moderate pain.   Yes [provider]  amLODipine (NORVASC) 10 MG tablet Take 10 mg by mouth daily.   Yes [provider]  aspirin 81 MG EC tablet Take 1 tablet by mouth daily.   Yes [provider]  atorvastatin (LIPITOR) 40 MG tablet Take 40 mg by mouth daily. 12/14/19  Yes [provider]  clopidogrel (PLAVIX) 75 MG tablet TAKE 1 TABLET BY MOUTH EVERY DAY WITH BREAKFAST Patient taking differently: Take 75 mg by mouth daily. 02/05/22  Yes Maeola Harman, MD  diphenhydrAMINE HCl, Sleep, (ZZZQUIL) 25 MG CAPS Take 25 mg by mouth at bedtime as needed (Sleep).   Yes [provider]  famotidine (PEPCID) 20 MG tablet Take 20 mg by mouth daily.   Yes [provider]  gabapentin (NEURONTIN) 300 MG capsule Take 300 mg by mouth at bedtime.   Yes [provider]  ibuprofen (ADVIL) 600 MG tablet Take 1 tablet (600 mg total) by mouth every 6 (six) hours as needed. 02/04/22  Yes Small, Brooke L, PA  insulin degludec (TRESIBA FLEXTOUCH) 100 UNIT/ML FlexTouch Pen Inject 10 Units into the skin at bedtime. Patient taking differently: Inject 12-18 Units into the skin at bedtime. 01/15/20  Yes Danford, Earl Lites, MD  levothyroxine (SYNTHROID) 150 MCG tablet Take 150 mcg by mouth daily. 12/02/19  Yes [provider]  pantoprazole (PROTONIX) 40 MG tablet Take 40 mg by mouth daily. 12/21/19  Yes [provider]  polyethylene glycol (MIRALAX / GLYCOLAX) 17 g packet Take 17 g by mouth daily.   Yes [provider]  zinc sulfate 220 (50 Zn) MG capsule Take 1 capsule (220 mg total) by mouth daily for 9 days. 08/13/22 08/22/22 Yes Rhyne, Ames Coupe, PA-C  nitroGLYCERIN (NITRODUR - DOSED IN MG/24 HR) 0.2 mg/hr  patch Place 1 patch (0.2 mg total) onto the skin daily. Patient not taking: Reported on 08/18/2022 07/03/22   Nadara Mustard, MD  pentoxifylline (TRENTAL) 400 MG CR tablet Take 1 tablet (400 mg total) by mouth 3 (three) times daily with meals. Patient not taking: Reported on 08/18/2022 07/03/22   Nadara Mustard, MD  Semaglutide, 1 MG/DOSE, (OZEMPIC, 1 MG/DOSE,) 2 MG/1.5ML SOPN Inject 1 mg into the skin every Saturday.    [provider]    Inpatient Medications: Scheduled Meds:  amLODipine  10 mg Oral Daily   aspirin EC  81 mg Oral Daily   atorvastatin  40 mg Oral Daily   clopidogrel  75 mg Oral Daily   famotidine  20 mg Oral Daily   furosemide  40 mg Intravenous BID   gabapentin  300 mg Oral QHS   insulin aspart  0-15 Units Subcutaneous TID WC   insulin aspart  0-5 Units Subcutaneous QHS   insulin glargine-yfgn  12 Units Subcutaneous QHS   levothyroxine  150 mcg Oral Q0600   Living Better with Heart Failure Book   Does not apply Once   pantoprazole  40 mg Oral Daily   potassium chloride  40 mEq Oral BID   Continuous Infusions:  heparin 1,100 Units/hr (08/19/22 0407)   PRN Meds: ondansetron **OR** ondansetron (ZOFRAN) IV, polyethylene glycol  Allergies:    Allergies  Allergen Reactions   Doxycycline Nausea And Vomiting    Extremely N/V   Trental [Pentoxifylline] Nausea And Vomiting   Penicillins Rash    Tolerated cephalosporins 07/2022     Social History:   Social History   Socioeconomic History   Marital status: Divorced    Spouse name: Not on file   Number of children: Not on file   Years of education: Not on file   Highest education level: Not on file  Occupational History   Not on file  Tobacco Use   Smoking status: Former    Types: Cigarettes    Quit date: 01/2017    Years since quitting: 5.5   Smokeless tobacco: Never  Vaping Use   Vaping Use: Never used  Substance and Sexual Activity   Alcohol use: Not Currently   Drug use: Yes    Types:  Marijuana   Sexual activity: Yes    Birth control/protection: Post-menopausal  Other Topics Concern   Not on file  Social History Narrative   Not on file   Social Determinants of Health   Financial Resource Strain: Not on file  Food Insecurity: No Food Insecurity (08/03/2022)   Hunger Vital Sign    Worried About Running Out of Food in the Last Year: Never true    Ran Out of Food in the Last Year: Never true  Transportation Needs: No Transportation Needs (08/03/2022)   PRAPARE - Administrator, Civil Service (Medical): No    Lack of Transportation (  Non-Medical): No  Physical Activity: Not on file  Stress: Not on file  Social Connections: Not on file  Intimate Partner Violence: Not At Risk (08/03/2022)   Humiliation, Afraid, Rape, and Kick questionnaire    Fear of Current or Ex-Partner: No    Emotionally Abused: No    Physically Abused: No    Sexually Abused: No    Family History:    Family History  Problem Relation Age of Onset   Diabetes Other    CAD Other      ROS:  Please see the history of present illness.   All other ROS reviewed and negative.     Physical Exam/Data:   Vitals:   08/18/22 2146 08/18/22 2340 08/19/22 0016 08/19/22 0336  BP: 90/62 96/63 (!) 107/91 93/67  Pulse: 75 74 75 80  Resp: 17 20 (!) 22 18  Temp: 97.9 F (36.6 C) (!) 97.5 F (36.4 C)  97.7 F (36.5 C)  TempSrc: Oral Axillary  Oral  SpO2: 97% 97% 97% 90%  Weight:    100.8 kg  Height:        Intake/Output Summary (Last 24 hours) at 08/19/2022 0745 Last data filed at 08/18/2022 2340 Gross per 24 hour  Intake 687 ml  Output --  Net 687 ml      08/19/2022    3:36 AM 08/18/2022    1:00 PM 08/02/2022    7:45 PM  Last 3 Weights  Weight (lbs) 222 lb 3.6 oz 200 lb 202 lb 6.1 oz  Weight (kg) 100.8 kg 90.719 kg 91.8 kg     Body mass index is 38.14 kg/m.  General:  Well nourished, well developed female appearing in no acute distress HEENT: normal Neck: no JVD Vascular: No  carotid bruits; Distal pulses 2+ bilaterally Cardiac:  normal S1, S2; RRR; no murmur  Lungs: rales along bases bilaterally.  Abd: soft, nontender, no hepatomegaly  Ext: 2+ pitting edema bilaterally Musculoskeletal: Both feet wrapped.  Skin: warm and dry  Neuro:  CNs 2-12 intact, no focal abnormalities noted Psych:  Normal affect   EKG:  The EKG was personally reviewed and demonstrates: NSR, HR 85 with PAC's and slight ST depression along the inferior and lateral leads which was more prominent when compared to prior tracings.  Telemetry:  Telemetry was personally reviewed and demonstrates: NSR, HR in 70's to 80's.   Relevant CV Studies:  Echocardiogram: 12/2019 IMPRESSIONS     1. Left ventricular ejection fraction, by estimation, is 65 to 70%. The  left ventricle has normal function. The left ventricle has no regional  wall motion abnormalities. There is moderate left ventricular hypertrophy.  Left ventricular diastolic  parameters are consistent with Grade I diastolic dysfunction (impaired  relaxation). Elevated left atrial pressure.   2. Right ventricular systolic function is normal. The right ventricular  size is normal.   3. The mitral valve is normal in structure. No evidence of mitral valve  regurgitation. No evidence of mitral stenosis.   4. The aortic valve is tricuspid. There is moderate calcification of the  aortic valve. There is moderate thickening of the aortic valve. Aortic  valve regurgitation is mild. Mild aortic valve stenosis. Aortic valve mean  gradient measures 15.0 mmHg.  Aortic valve peak gradient measures 24.6 mmHg. Aortic valve area, by VTI  measures 1.57 cm.   5. The inferior vena cava is normal in size with greater than 50%  respiratory variability, suggesting right atrial pressure of 3 mmHg.   Laboratory Data:  High Sensitivity Troponin:   Recent Labs  Lab 08/18/22 1344 08/18/22 1607 08/19/22 0228  TROPONINIHS 1,096* 1,323* 1,227*      Chemistry Recent Labs  Lab 08/18/22 1344 08/19/22 0228  NA 137 136  K 3.9 3.8  CL 105 103  CO2 22 22  GLUCOSE 204* 147*  BUN 38* 35*  CREATININE 1.28* 1.41*  CALCIUM 8.2* 8.2*  GFRNONAA 49* 44*  ANIONGAP 10 11    No results for input(s): "PROT", "ALBUMIN", "AST", "ALT", "ALKPHOS", "BILITOT" in the last 168 hours. Lipids No results for input(s): "CHOL", "TRIG", "HDL", "LABVLDL", "LDLCALC", "CHOLHDL" in the last 168 hours.  Hematology Recent Labs  Lab 08/18/22 1344 08/19/22 0229  WBC 7.3 7.3  RBC 2.19* 2.70*  HGB 6.1* 7.5*  HCT 20.4* 24.1*  MCV 93.2 89.3  MCH 27.9 27.8  MCHC 29.9* 31.1  RDW 17.6* 17.4*  PLT 304 297   Thyroid No results for input(s): "TSH", "FREET4" in the last 168 hours.  BNP Recent Labs  Lab 08/18/22 1344  BNP 486.0*    DDimer No results for input(s): "DDIMER" in the last 168 hours.   Radiology/Studies:  CT Angio Chest PE W and/or Wo Contrast  Result Date: 08/18/2022 CLINICAL DATA:  Pulmonary embolism (PE) suspected, high prob EXAM: CT ANGIOGRAPHY CHEST WITH CONTRAST TECHNIQUE: Multidetector CT imaging of the chest was performed using the standard protocol during bolus administration of intravenous contrast. Multiplanar CT image reconstructions and MIPs were obtained to evaluate the vascular anatomy. RADIATION DOSE REDUCTION: This exam was performed according to the departmental dose-optimization program which includes automated exposure control, adjustment of the mA and/or kV according to patient size and/or use of iterative reconstruction technique. CONTRAST:  75mL OMNIPAQUE IOHEXOL 350 MG/ML SOLN COMPARISON:  X-ray 08/18/2022 FINDINGS: Cardiovascular: Satisfactory opacification of the pulmonary arteries to the segmental level. No evidence of pulmonary embolism. Thoracic aorta is nonaneurysmal. Atherosclerotic calcifications of the aorta and coronary arteries. Normal heart size. Trace pericardial effusion. Mediastinum/Nodes: Mildly enlarged  mediastinal and bilateral hilar lymph nodes, largest measuring up to 1.1 cm short axis in the precarinal space (series 6, image 40). No axillary lymphadenopathy. Thyroid gland, trachea, and esophagus demonstrate no significant abnormality. Lungs/Pleura: Small-moderate layering bilateral pleural effusions. Probable compressive atelectasis versus consolidation within the dependent aspects of the right and left lower lobe. Bilateral perihilar ground-glass opacities with interlobular septal thickening. No pneumothorax. Upper Abdomen: No acute abnormality. Musculoskeletal: No chest wall abnormality. No acute or significant osseous findings. Review of the MIP images confirms the above findings. IMPRESSION: 1. No evidence of pulmonary embolism. 2. Findings suggestive of pulmonary edema with small-moderate layering bilateral pleural effusions. 3. Probable compressive atelectasis versus consolidation within the dependent aspects of the lung bases. 4. Mildly enlarged mediastinal and bilateral hilar lymph nodes, likely reactive. 5. Trace pericardial effusion. 6. Aortic and coronary artery atherosclerosis (ICD10-I70.0). Electronically Signed   By: Duanne Guess D.O.   On: 08/18/2022 15:35   DG Chest 2 View  Result Date: 08/18/2022 CLINICAL DATA:  Shortness of breath EXAM: CHEST - 2 VIEW COMPARISON:  None Available. FINDINGS: Normal cardiopericardial silhouette bilateral interstitial changes. Tiny effusions. No pneumothorax or consolidation IMPRESSION: Bilateral interstitial changes with small effusions. Acute process is possible. Recommend follow-up Electronically Signed   By: Karen Kays M.D.   On: 08/18/2022 14:03     Assessment and Plan:   1. NSTEMI - Hs Troponin values have been elevated to 1096, 1323 and 1227 with EKG showing slight ST depression along the inferior and lateral  leads which was more prominent when compared to prior tracings. - Suspect this is secondary to demand ischemia in the setting of an  acute CHF exacerbation and anemia as she denies any recent chest pain. An echocardiogram is pending to assess for any structural abnormalities. If this is reassuring, could consider a Coronary CTA once her volume status has improved and she is able to lie flat for the test. If echo is abnormal, can consider LHC once Hgb stabilizes as she has multiple cardiac risk factors (PAD, HTN, HLD, Type 2 DM and prior tobacco use).  - Discussed with Dr. Cristal Deer and will stop IV Heparin given her anemia and the requirement for transfusions. Remains on DAPT with ASA and Plavix (on this per Vascular) and Atorvastatin  daily.   2. Acute HF  - Echocardiogram pending to determine HFpEF or HFrEF. BNP was at 486 on admission and CTA showed small to moderate layering pleural effusions. - She is scheduled to receive IV Lasix 40 mg twice daily and will follow I's and O's along with daily weights with this. Her renal function has been variable with creatinine up to 1.41 this morning. Repeat BMET tomorrow. If renal function limits diuresis, can consider a thoracentesis.  3.  PAD - She has undergone multiple interventions as discussed above and recently underwent bilateral transmetatarsal amputation on 08/08/2022. Followed by Vascular and Ortho as an outpatient. She requested I make her Vascular Surgeon aware of her admission and a message has been sent.  - Remains on ASA, Plavix and statin therapy.   4. HTN - BP has been variable from 90/62 - 117/56 in the past 24 hours. She has been continued on PTA Amlodipine  daily. Will hold for now given her soft BP and to allow for diuresis. May ultimately require additional medication changes based off her echo results.   5. HLD - LDL was at 54 when checked earlier this month. She has been continued on Atorvastatin  daily.   6. Anemia - She reports requiring 3 units pRBC's during her prior admission and hemoglobin had improved to 8.2 at the time of discharge. Was  found to be significantly anemic with hemoglobin at 6.1 this admission and she received 1 unit pRBC's with hemoglobin at 7.5 today. Will stop IV Heparin as discussed above. Further workup per the admitting team.      For questions or updates, please contact [facility] Please consult [email or web address] for contact info under    Signed, Ellsworth Lennox, PA-C  08/19/2022 7:45 AM

## 2022-08-20 ENCOUNTER — Encounter: Payer: Managed Care, Other (non HMO) | Admitting: Orthopedic Surgery

## 2022-08-20 ENCOUNTER — Other Ambulatory Visit (HOSPITAL_COMMUNITY): Payer: Self-pay

## 2022-08-20 DIAGNOSIS — E119 Type 2 diabetes mellitus without complications: Secondary | ICD-10-CM | POA: Diagnosis not present

## 2022-08-20 DIAGNOSIS — D62 Acute posthemorrhagic anemia: Secondary | ICD-10-CM | POA: Diagnosis not present

## 2022-08-20 DIAGNOSIS — N179 Acute kidney failure, unspecified: Secondary | ICD-10-CM

## 2022-08-20 DIAGNOSIS — I214 Non-ST elevation (NSTEMI) myocardial infarction: Secondary | ICD-10-CM | POA: Diagnosis not present

## 2022-08-20 DIAGNOSIS — I5021 Acute systolic (congestive) heart failure: Secondary | ICD-10-CM | POA: Diagnosis not present

## 2022-08-20 DIAGNOSIS — D638 Anemia in other chronic diseases classified elsewhere: Secondary | ICD-10-CM | POA: Diagnosis not present

## 2022-08-20 DIAGNOSIS — I222 Subsequent non-ST elevation (NSTEMI) myocardial infarction: Secondary | ICD-10-CM

## 2022-08-20 LAB — BASIC METABOLIC PANEL
Anion gap: 10 (ref 5–15)
BUN: 35 mg/dL — ABNORMAL HIGH (ref 6–20)
CO2: 24 mmol/L (ref 22–32)
Calcium: 8.4 mg/dL — ABNORMAL LOW (ref 8.9–10.3)
Chloride: 104 mmol/L (ref 98–111)
Creatinine, Ser: 1.32 mg/dL — ABNORMAL HIGH (ref 0.44–1.00)
GFR, Estimated: 48 mL/min — ABNORMAL LOW (ref >60)
Glucose, Bld: 155 mg/dL — ABNORMAL HIGH (ref 70–99)
Potassium: 4.9 mmol/L (ref 3.5–5.1)
Sodium: 138 mmol/L (ref 135–145)

## 2022-08-20 LAB — CBC
HCT: 25.4 % — ABNORMAL LOW (ref 36.0–46.0)
Hemoglobin: 8.3 g/dL — ABNORMAL LOW (ref 12.0–15.0)
MCH: 28.9 pg (ref 26.0–34.0)
MCHC: 32.7 g/dL (ref 30.0–36.0)
MCV: 88.5 fL (ref 80.0–100.0)
Platelets: 297 10*3/uL (ref 150–400)
RBC: 2.87 MIL/uL — ABNORMAL LOW (ref 3.87–5.11)
RDW: 17.5 % — ABNORMAL HIGH (ref 11.5–15.5)
WBC: 8.3 10*3/uL (ref 4.0–10.5)
nRBC: 0 % (ref 0.0–0.2)

## 2022-08-20 LAB — GLUCOSE, CAPILLARY
Glucose-Capillary: 111 mg/dL — ABNORMAL HIGH (ref 70–99)
Glucose-Capillary: 127 mg/dL — ABNORMAL HIGH (ref 70–99)
Glucose-Capillary: 151 mg/dL — ABNORMAL HIGH (ref 70–99)
Glucose-Capillary: 137 mg/dL — ABNORMAL HIGH (ref 70–99)

## 2022-08-20 MED ORDER — METOPROLOL SUCCINATE ER 25 MG PO TB24
12.5000 mg | ORAL_TABLET | Freq: Every day | ORAL | Status: DC
  Administered 2022-08-20 – 2022-08-27 (×8): 12.5 mg via ORAL
  Filled 2022-08-20 (×8): qty 1

## 2022-08-20 MED ORDER — ACETAMINOPHEN 325 MG PO TABS
650.0000 mg | ORAL_TABLET | ORAL | Status: DC | PRN
  Administered 2022-08-20: 650 mg via ORAL
  Filled 2022-08-20: qty 2

## 2022-08-20 NOTE — Consult Note (Signed)
WOC Nurse Consult Note: Reason for Consult:Bilateral transmetatarsal head amputation sites. (Dr. Lajoyce Corners, 08/08/22). Patient last seen in office of Provider on 08/16/22. Wound type:Surgical Pressure Injury POA: N/A Measurement:To be obtained by Bedside RN and documented on Nursing Flow Sheet with application of next dressing today Wound bed:N/A Dressing procedure/placement/frequency: I will continue the POC as directed by Dr. Lajoyce Corners in his note on 08/16/22 using a daily soap and water cleanse, rinse and dry followed by placement of dry dressings.  I have communicated with Dr. Blake Divine this morning via secure chat and recommended consultation with Dr. Lajoyce Corners if further evaluation of the incisions is desired.  WOC nursing team will not follow, but will remain available to this patient, the nursing and medical teams.  Please re-consult if needed.  Thank you for inviting [place] to participate in this patient's Plan of Care.  Ladona Mow, MSN, RN, CNS, GNP, Leda Min, [employer], [employer] phone:  [phone]

## 2022-08-20 NOTE — Progress Notes (Addendum)
Rounding Note    Patient Name: Kayla Schmitt Date of Encounter: 08/20/2022  [facility] Cardiologist: New to Dr Cristal Deer   Subjective   Patient states she had orthopnea last night, but overall SOB is improving, still needs to sit up to feel better. She has 3-4 years of BLE edema, that seems improving now. She states she never had any SOB or heart disease before last Tuesday. She never had any chest pain.   Inpatient Medications    Scheduled Meds:  aspirin EC  81 mg Oral Daily   atorvastatin  40 mg Oral Daily   clopidogrel  75 mg Oral Daily   enoxaparin (LOVENOX) injection  40 mg Subcutaneous Q24H   famotidine  20 mg Oral Daily   furosemide  40 mg Intravenous BID   gabapentin  300 mg Oral QHS   insulin aspart  0-15 Units Subcutaneous TID WC   insulin aspart  0-5 Units Subcutaneous QHS   insulin glargine-yfgn  12 Units Subcutaneous QHS   levothyroxine  150 mcg Oral Q0600   metoprolol succinate  12.5 mg Oral Daily   pantoprazole  40 mg Oral Daily   potassium chloride  40 mEq Oral BID   Continuous Infusions:  PRN Meds: acetaminophen, ondansetron **OR** ondansetron (ZOFRAN) IV, polyethylene glycol   Vital Signs    Vitals:   08/19/22 1214 08/19/22 2029 08/20/22 0607 08/20/22 0810  BP: 114/68 110/70 101/62 107/65  Pulse: 84 94 96 93  Resp: 18 (!) 21 (!) 24 20  Temp: 98.3 F (36.8 C) 98.2 F (36.8 C) 98.1 F (36.7 C)   TempSrc: Oral Oral Oral   SpO2: 93% 91% 92%   Weight:   101.3 kg   Height:        Intake/Output Summary (Last 24 hours) at 08/20/2022 0846 Last data filed at 08/20/2022 0100 Gross per 24 hour  Intake 480 ml  Output 1000 ml  Net -520 ml      08/20/2022    6:07 AM 08/19/2022    3:36 AM 08/18/2022    1:00 PM  Last 3 Weights  Weight (lbs) 223 lb 5.2 oz 222 lb 3.6 oz 200 lb  Weight (kg) 101.3 kg 100.8 kg 90.719 kg      Telemetry    Sinus rhythm 80-90s, period of disconnection noted  - Personally Reviewed  ECG    EKG  today showed sinus rhythm 97bpm, sutle ST depression of lateral leads unchanged - Personally Reviewed  Physical Exam   GEN: No acute distress.   Neck: JVD up to mid neck Cardiac: RRR, systolic murmur grade II throughout   Respiratory: Clear to auscultation bilaterally. On [place] oxygen. Speaks full sentence  GI: Soft, nontender, non-distended  MS: BLE 1+ edema, s/p TMA bilaterally, ACE wrap not removed for exam Neuro:  Nonfocal  Psych: Normal affect   Labs    High Sensitivity Troponin:   Recent Labs  Lab 08/18/22 1344 08/18/22 1607 08/19/22 0228  TROPONINIHS 1,096* 1,323* 1,227*     Chemistry Recent Labs  Lab 08/18/22 1344 08/19/22 0228 08/20/22 0244  NA 137 136 138  K 3.9 3.8 4.9  CL 105 103 104  CO2 22 22 24   GLUCOSE 204* 147* 155*  BUN 38* 35* 35*  CREATININE 1.28* 1.41* 1.32*  CALCIUM 8.2* 8.2* 8.4*  GFRNONAA 49* 44* 48*  ANIONGAP 10 11 10     Lipids No results for input(s): "CHOL", "TRIG", "HDL", "LABVLDL", "LDLCALC", "CHOLHDL" in the last 168 hours.  Hematology  Recent Labs  Lab 08/18/22 1344 08/19/22 0229 08/19/22 0915 08/19/22 1015 08/20/22 0244  WBC 7.3 7.3  --   --  8.3  RBC 2.19* 2.70*  --  3.00* 2.87*  HGB 6.1* 7.5* 7.7*  --  8.3*  HCT 20.4* 24.1* 24.7*  --  25.4*  MCV 93.2 89.3  --   --  88.5  MCH 27.9 27.8  --   --  28.9  MCHC 29.9* 31.1  --   --  32.7  RDW 17.6* 17.4*  --   --  17.5*  PLT 304 297  --   --  297   Thyroid No results for input(s): "TSH", "FREET4" in the last 168 hours.  BNP Recent Labs  Lab 08/18/22 1344  BNP 486.0*    DDimer No results for input(s): "DDIMER" in the last 168 hours.   Radiology    ECHOCARDIOGRAM COMPLETE  Result Date: 08/19/2022    ECHOCARDIOGRAM REPORT   Patient Name:   Kayla Schmitt [facility] Date of Exam: 08/19/2022 Medical Rec #:  [id number]              Height:       64.0 in Accession #:    [id number]             Weight:       222.2 lb Date of Birth:  18-Nov-1965               BSA:          2.046 m Patient  Age:    56 years               BP:           114/68 mmHg Patient Gender: F                      HR:           95 bpm. Exam Location:  Inpatient Procedure: 2D Echo, 3D Echo, Cardiac Doppler, Color Doppler and Strain Analysis Indications:    R06.9 DOE; R06.02 SOB  History:        Patient has prior history of Echocardiogram examinations. Risk                 Factors:Diabetes, Hypertension and Former Smoker.  Sonographer:    Dondra Prader RVT RCS Referring Phys: [phone] JACOB J STINSON  Sonographer Comments: Image acquisition challenging due to respiratory motion. Patient was short of breath; did Echo with patient sitting up. IMPRESSIONS  1. Left ventricular ejection fraction, by estimation, is 40 to 45%. The left ventricle has mildly decreased function. The left ventricle demonstrates regional wall motion abnormalities (see scoring diagram/findings for description). There is mild left ventricular hypertrophy. Left ventricular diastolic parameters are indeterminate.  2. Right ventricular systolic function is normal. The right ventricular size is normal. Tricuspid regurgitation signal is inadequate for assessing PA pressure.  3. Left atrial size was mildly dilated.  4. A small pericardial effusion is present.  5. The mitral valve is degenerative. Severe mitral valve regurgitation. No evidence of mitral stenosis.  6. The aortic valve was not well visualized. There is moderate calcification of the aortic valve. There is moderate thickening of the aortic valve. Aortic valve regurgitation is trivial. Mild aortic valve stenosis. Aortic valve mean gradient measures 10.0 mmHg.  7. The inferior vena cava is dilated in size with <50% respiratory variability, suggesting right atrial pressure of 15 mmHg. FINDINGS  Left Ventricle: Left ventricular ejection  fraction, by estimation, is 40 to 45%. The left ventricle has mildly decreased function. The left ventricle demonstrates regional wall motion abnormalities. The left ventricular  internal cavity size was normal in size. There is mild left ventricular hypertrophy. Left ventricular diastolic parameters are indeterminate.  LV Wall Scoring: The antero-lateral wall and apical lateral segment are hypokinetic. Right Ventricle: The right ventricular size is normal. No increase in right ventricular wall thickness. Right ventricular systolic function is normal. Tricuspid regurgitation signal is inadequate for assessing PA pressure. Left Atrium: Left atrial size was mildly dilated. Right Atrium: Right atrial size was normal in size. Pericardium: A small pericardial effusion is present. Mitral Valve: The mitral valve is degenerative in appearance. Mild to moderate mitral annular calcification. Severe mitral valve regurgitation. No evidence of mitral valve stenosis. Tricuspid Valve: The tricuspid valve is normal in structure. Tricuspid valve regurgitation is trivial. No evidence of tricuspid stenosis. Aortic Valve: The aortic valve was not well visualized. There is moderate calcification of the aortic valve. There is moderate thickening of the aortic valve. Aortic valve regurgitation is trivial. Aortic regurgitation PHT measures 530 msec. Mild aortic stenosis is present. Aortic valve mean gradient measures 10.0 mmHg. Aortic valve peak gradient measures 17.2 mmHg. Aortic valve area, by VTI measures 2.98 cm. Pulmonic Valve: The pulmonic valve was normal in structure. Pulmonic valve regurgitation is trivial. No evidence of pulmonic stenosis. Aorta: The aortic root is normal in size and structure. Venous: The inferior vena cava is dilated in size with less than 50% respiratory variability, suggesting right atrial pressure of 15 mmHg. IAS/Shunts: The interatrial septum was not well visualized.  LEFT VENTRICLE PLAX 2D LVIDd:         5.40 cm LVIDs:         4.00 cm LV PW:         1.10 cm LV IVS:        1.00 cm LVOT diam:     1.80 cm LV SV:         119 LV SV Index:   58 LVOT Area:     2.54 cm  LV Volumes (MOD)  LV vol d, MOD A2C: 169.0 ml LV vol d, MOD A4C: 161.0 ml LV vol s, MOD A2C: 62.1 ml LV vol s, MOD A4C: 84.9 ml LV SV MOD A2C:     106.9 ml LV SV MOD A4C:     161.0 ml LV SV MOD BP:      91.8 ml RIGHT VENTRICLE             IVC RV Basal diam:  3.90 cm     IVC diam: 2.20 cm RV Mid diam:    2.50 cm RV S prime:     14.80 cm/s TAPSE (M-mode): 2.2 cm LEFT ATRIUM             Index        RIGHT ATRIUM           Index LA diam:        4.10 cm 2.00 cm/m   RA Area:     11.80 cm LA Vol (A2C):   80.1 ml 39.15 ml/m  RA Volume:   28.60 ml  13.98 ml/m LA Vol (A4C):   46.6 ml 22.78 ml/m LA Biplane Vol: 61.6 ml 30.11 ml/m  AORTIC VALVE                     PULMONIC VALVE AV Area (Vmax):    2.42  cm      PV Vmax:       0.91 m/s AV Area (Vmean):   2.38 cm      PV Peak grad:  3.3 mmHg AV Area (VTI):     2.98 cm AV Vmax:           207.50 cm/s AV Vmean:          149.500 cm/s AV VTI:            0.400 m AV Peak Grad:      17.2 mmHg AV Mean Grad:      10.0 mmHg LVOT Vmax:         197.00 cm/s LVOT Vmean:        140.000 cm/s LVOT VTI:          0.468 m LVOT/AV VTI ratio: 1.17 AI PHT:            530 msec AR Vena Contracta: 0.40 cm  AORTA Ao Root diam: 3.00 cm Ao Asc diam:  3.30 cm Ao Arch diam: 2.9 cm MITRAL VALVE MV Area (PHT): 4.49 cm       SHUNTS MV Decel Time: 169 msec       Systemic VTI:  0.47 m MR Peak grad:    58.1 mmHg    Systemic Diam: 1.80 cm MR Mean grad:    42.0 mmHg MR Vmax:         381.00 cm/s MR Vmean:        316.0 cm/s MR PISA:         6.28 cm MR PISA Eff ROA: 51 mm MR PISA Radius:  1.00 cm MV E velocity: 133.00 cm/s MV A velocity: 92.70 cm/s MV E/A ratio:  1.43 Weston Brass MD Electronically signed by Weston Brass MD Signature Date/Time: 08/19/2022/5:57:20 PM    Final    CT Angio Chest PE W and/or Wo Contrast  Result Date: 08/18/2022 CLINICAL DATA:  Pulmonary embolism (PE) suspected, high prob EXAM: CT ANGIOGRAPHY CHEST WITH CONTRAST TECHNIQUE: Multidetector CT imaging of the chest was performed using the  standard protocol during bolus administration of intravenous contrast. Multiplanar CT image reconstructions and MIPs were obtained to evaluate the vascular anatomy. RADIATION DOSE REDUCTION: This exam was performed according to the departmental dose-optimization program which includes automated exposure control, adjustment of the mA and/or kV according to patient size and/or use of iterative reconstruction technique. CONTRAST:  75mL OMNIPAQUE IOHEXOL 350 MG/ML SOLN COMPARISON:  X-ray 08/18/2022 FINDINGS: Cardiovascular: Satisfactory opacification of the pulmonary arteries to the segmental level. No evidence of pulmonary embolism. Thoracic aorta is nonaneurysmal. Atherosclerotic calcifications of the aorta and coronary arteries. Normal heart size. Trace pericardial effusion. Mediastinum/Nodes: Mildly enlarged mediastinal and bilateral hilar lymph nodes, largest measuring up to 1.1 cm short axis in the precarinal space (series 6, image 40). No axillary lymphadenopathy. Thyroid gland, trachea, and esophagus demonstrate no significant abnormality. Lungs/Pleura: Small-moderate layering bilateral pleural effusions. Probable compressive atelectasis versus consolidation within the dependent aspects of the right and left lower lobe. Bilateral perihilar ground-glass opacities with interlobular septal thickening. No pneumothorax. Upper Abdomen: No acute abnormality. Musculoskeletal: No chest wall abnormality. No acute or significant osseous findings. Review of the MIP images confirms the above findings. IMPRESSION: 1. No evidence of pulmonary embolism. 2. Findings suggestive of pulmonary edema with small-moderate layering bilateral pleural effusions. 3. Probable compressive atelectasis versus consolidation within the dependent aspects of the lung bases. 4. Mildly enlarged mediastinal and bilateral hilar lymph nodes, likely reactive. 5. Trace pericardial  effusion. 6. Aortic and coronary artery atherosclerosis (ICD10-I70.0).  Electronically Signed   By: Duanne Guess D.O.   On: 08/18/2022 15:35   DG Chest 2 View  Result Date: 08/18/2022 CLINICAL DATA:  Shortness of breath EXAM: CHEST - 2 VIEW COMPARISON:  None Available. FINDINGS: Normal cardiopericardial silhouette bilateral interstitial changes. Tiny effusions. No pneumothorax or consolidation IMPRESSION: Bilateral interstitial changes with small effusions. Acute process is possible. Recommend follow-up Electronically Signed   By: Karen Kays M.D.   On: 08/18/2022 14:03    Cardiac Studies    Echo from 08/19/22:  1. Left ventricular ejection fraction, by estimation, is 40 to 45%. The  left ventricle has mildly decreased function. The left ventricle  demonstrates regional wall motion abnormalities (see scoring  diagram/findings for description). There is mild left  ventricular hypertrophy. Left ventricular diastolic parameters are  indeterminate.   2. Right ventricular systolic function is normal. The right ventricular  size is normal. Tricuspid regurgitation signal is inadequate for assessing  PA pressure.   3. Left atrial size was mildly dilated.   4. A small pericardial effusion is present.   5. The mitral valve is degenerative. Severe mitral valve regurgitation.  No evidence of mitral stenosis.   6. The aortic valve was not well visualized. There is moderate  calcification of the aortic valve. There is moderate thickening of the  aortic valve. Aortic valve regurgitation is trivial. Mild aortic valve  stenosis. Aortic valve mean gradient measures  10.0 mmHg.   7. The inferior vena cava is dilated in size with <50% respiratory  variability, suggesting right atrial pressure of 15 mmHg.   Patient Profile     57 y.o. female with PMH of PAD (s/p multiple interventions and most recent bilateral TMA 08/08/22), HLD, type 2 DM, GERD, hypothyroidism,  cardiology is following for CHF, NSTEMI.   Assessment & Plan    Acute systolic heart failure - Presented  to [facility] 08/18/2022 for DOE and orthopnea, following recent discharge 08/13/2022 for critical limb ischemia of bilateral lower extremities s/p bilateral TMA, no chest pain - BNP 486, Hs trop 1096 >1323>1227, Cr 1.28, GFR 49, Hgb 6.1, POA - CTA chest 4/20 no PE, pulmonary edema with small to moderate layering bilateral pleural effusions, probable compressive atelectasis versus consolidation of lung bases, mild enlarged mediastinal and bilateral hilar lymph nodes, trace pericardial effusion, aortic and or coronary artery atherosclerosis -Echocardiogram from 08/19/2022 revealed LVEF 40 to 45%, antero-lateral wall and apical lateral segment are hypokinetic, mild LVH, indeterminate diastolic parameter, normal RV, mild LAE, small pericardial effusion, severe MR, trivial AI, mild AS  -Suspect ischemic cardiomyopathy, recommend cardiac catheterization once euvolemic  -On IV Lasix 40 mg twice daily, intake and not taken not accurately recorded, weight 222 >223, clinically hypervolemic, continue IV diuresis, please track daily weight and I&Os - GDMT: Blood pressure is low, CKD III, both are limiting for GDMT, will trial low dose metoprolol XL 12.5mg  daily today and monitor tolerance - transfuse to keep Hgb >7   NSTEMI  -See diagnostic as above -Suspect she has underlying CAD, Hs trop elevation maybe demand ischemia from CHF not ACS  -Recommend cardiac catheterization once euvolemic -Medical therapy: already on DAPT with ASA +Plavix, atorvastatin, add metoprolol XL 12.5mg  daily as above   HTN - BP low normal currently , on amlodipine at home /held   PVD s/p TMA bilaterally for critical limb ischemia 08/08/22  Type 2 DM GERD AKI Hypothyroidism  - per primary team  For  questions or updates, please contact [facility] Please consult [email or web address] for contact info under  Signed, Cyndi Bender, NP  08/20/2022, 8:46 AM    Patient seen and examined, note reviewed with the signed Advanced  Practice Provider. I personally reviewed laboratory data, imaging studies and relevant notes. I independently examined the patient and formulated the important aspects of the plan. I have personally discussed the plan with the patient and/or family. Comments or changes to the note/plan are indicated below.  Still with significant shortness of breath.  And is not able to tolerate lying flat.  Agree with continuing her Lasix 40 mg twice daily. She will benefit from ischemic evaluation with her elevated troponin and wall motion abnormalities, therefore once the patient has improved clinically we will plan for cardiac catheterization later this week. For now continue her dual antiplatelet therapy with aspirin and statin, atorvastatin, agree with low-dose beta-blocker for now. Will have to monitor her kidney function closely creatinine slightly trending up.  Track daily weights with input and output. Have been discussed with the patient she is in agreement.  All of her questions has been answered.  Thomasene Ripple DO, MS [facility] Attending Cardiologist [facility]  [address] [place], [place] [address] [phone] Website: [email or web address]

## 2022-08-20 NOTE — Progress Notes (Signed)
   Heart Failure Stewardship Pharmacist Progress Note   PCP: [facility] PCP-Cardiologist: None    HPI:  57 yo F with PMH of T2DM, HTN, hypothyroidism, PAD, GERD and chronic anemia.   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 She had increased bleeding from the surgical sites after discharge.   Presented to the ED on 4/20 with shortness of breath and orthopnea over the last 3 days. CXR showed bilateral interstitial changes with small pleural effusions. CTA showed no evidence of a PE but was noted to have pulmonary edema with small to moderate layering bilateral effusions and probable compressive atelectasis versus consolidation in the lung bases. She was transferred from [facility] to [facility] for further evaluation. Troponin elevation secondary to demand ischemia. ECHO 4/21 with LVEF 40-45%, regional wall motion abnormalities, mild LVH, RV normal. Plan for cath once euvolemic.   Current HF Medications: Diuretic: furosemide 40 mg IV BID Beta Blocker: metoprolol XL 12.5 mg daily  Prior to admission HF Medications: None *also on amlodipine 10 mg daily  Pertinent Lab Values: Serum creatinine 1.32, BUN 35, Potassium 4.9, Sodium 138, BNP 486, A1c 6.4   Vital Signs: Weight: 223 lbs (admission weight: 222 lbs) Blood pressure: 107/60s  Heart rate: 90s  I/O: incomplete  Medication Assistance / Insurance Benefits Check: Does the patient have prescription insurance?  Yes Type of insurance plan: Chart[employer] loss adjuster  Outpatient Pharmacy:  Prior to admission outpatient pharmacy: [facility] Is the patient willing to use [facility] TOC pharmacy at discharge? Yes Is the patient willing to transition their outpatient pharmacy to utilize a [facility] outpatient pharmacy?   Pending    Assessment: 1. Acute systolic and diastolic CHF (LVEF 40-45%), pending ischemic evaluation. NYHA class IV symptoms. - Continue furosemide 40 mg IV BID - may need to  increase pending response and creatinine trends. Strict I/Os and daily weights. Keep K>4 and Mg>2. - Agree with starting metoprolol XL 12.5 mg daily - Consider adding ARB/ARNI + MRA + SGLT2i prior to discharge once renal function improved - Agree with holding amlodipine, would not resume at discharge   Plan: 1) Medication changes recommended at this time: - Agree with changes  2) Patient assistance: - Farxiga copay $25 - copay card lowers to $0 per month - Jardiance copay $25 - copay card lowers to $10 per month - Entresto copay $30 - copay card lowers to $10 per fill (30-90 day supply)  3)  Education  - To be completed prior to discharge  Sharen Hones, PharmD, BCPS Heart Failure Stewardship Pharmacist Phone [phone]

## 2022-08-20 NOTE — Progress Notes (Signed)
[facility]                                                                               Kayla Schmitt, is a 57 y.o. female, DOB - 1965-06-15, ZOX:[id number] Admit date - 08/18/2022    Outpatient Primary MD for the patient is Medicine, [facility]  LOS - 2  days    Brief summary   Kayla Schmitt is a 57 y.o. female with medical history significant of type 2 diabetes on insulin, hypertension, hypothyroidism, peripheral artery disease on antiplatelet therapy, GERD, chronic anemia.  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  The patient reports having increased bleeding when she went home from the surgical incision sites.  She reports bleeding a fair amount, but did not come back to the hospital for evaluation.  Few days ago, the bleeding stopped and has remained hemostatic for the past several days.  She has been noting increased difficulty breathing and shortness of breath.  She is having difficulty laying down flat.  She was admitted for NSTEMI with systolic heart failure.   Assessment & Plan    Assessment and Plan:  Acute respiratory failure with hypoxia secondary to acute systolic heart failure/ NSTEMI IV lasix 40 mg bid, urine output about 1lit int he last 24 hours.  ECHO showed LVEF of 40 to 45%, mildly decreased function, with regional wall motion abnormalities. Diastolic parameters are indeterminate. Severe MVR. Cardiology on board , suspect ischemic cardiomyopathy, recommend cardiac cath once euvolemic.  Elevated troponins probably from demand ischemia from CHF and anemia.  Will probably need cath in the future.  Currently on aspirin and plavix and lipitor.    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  S/p 1 unit of prbc transfusion. Post transfusion hemoglobin is 7.7, improved to 8.3. Stool for occult blood is negative yesterday.  She  denies any melena or use of NSAIDS.  Anemia panel not significant.  Baseline hemoglobin  between 9.8 to  11 in 2020 and 10.1 in 01/2022.  GI consulted, recommended outpatient work up for now.    Insulin dependent DM  CBG (last 3)  Recent Labs    08/19/22 1832 08/19/22 2108 08/20/22 0726  GLUCAP 173* 173* 111*    Resume SSI and semglee 12 units at bedtime.  No changes in meds.    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  Wound care as per Dr Lajoyce Corners.   Critical limb ischemia / PVD 4/4- Left SFA and above knee popliteal artery mechanical thrombectomy, with angioplasty and stenting of left above knee popliteal artery and SFA   4/8- Right SFA and above knee popliteal artery laser atherectomy, with DCBA of the right above knee popliteal artery and SFA. She was discharged on aspirin and plavix on 4/15 by vascular surgery.  Continue with aspirin and plavix and lipitor.    Hypothyroidism:  Resume synthroid.    Estimated body mass index is 38.33 kg/m as calculated from the following:   Height as of this encounter: 5\' 4"  (1.626 m).   Weight as  of this encounter: 101.3 kg.  Code Status: full code.  DVT Prophylaxis:  enoxaparin (LOVENOX) injection 40 mg Start: 08/19/22 0945   Level of Care: Level of care: Progressive Family Communication: none at bedside.   Disposition Plan:     Remains inpatient appropriate:  systolic heart failure , NSTEMI.  Procedures:  Echocardiogram.   Consultants:   Cardiology.  Gastroenterology.   Antimicrobials:   Anti-infectives (From admission, onward)    None        Medications  Scheduled Meds:  aspirin EC  81 mg Oral Daily   atorvastatin  40 mg Oral Daily   clopidogrel  75 mg Oral Daily   enoxaparin (LOVENOX) injection  40 mg Subcutaneous Q24H   famotidine  20 mg Oral Daily   furosemide  40 mg Intravenous BID   gabapentin  300 mg Oral QHS   insulin aspart  0-15 Units  Subcutaneous TID WC   insulin aspart  0-5 Units Subcutaneous QHS   insulin glargine-yfgn  12 Units Subcutaneous QHS   levothyroxine  150 mcg Oral Q0600   metoprolol succinate  12.5 mg Oral Daily   pantoprazole  40 mg Oral Daily   potassium chloride  40 mEq Oral BID   Continuous Infusions: PRN Meds:.acetaminophen, ondansetron **OR** ondansetron (ZOFRAN) IV, polyethylene glycol    Subjective:   Kayla Schmitt was seen and examined today.dyspnea improved, still on oxygen.   Objective:   Vitals:   08/19/22 1214 08/19/22 2029 08/20/22 0607 08/20/22 0810  BP: 114/68 110/70 101/62 107/65  Pulse: 84 94 96 93  Resp: 18 (!) 21 (!) 24 20  Temp: 98.3 F (36.8 C) 98.2 F (36.8 C) 98.1 F (36.7 C)   TempSrc: Oral Oral Oral   SpO2: 93% 91% 92%   Weight:   101.3 kg   Height:        Intake/Output Summary (Last 24 hours) at 08/20/2022 1205 Last data filed at 08/20/2022 1100 Gross per 24 hour  Intake 480 ml  Output 1200 ml  Net -720 ml    Filed Weights   08/18/22 1300 08/19/22 0336 08/20/22 0607  Weight: 90.7 kg 100.8 kg 101.3 kg     Exam General exam: Appears calm and comfortable  Respiratory system: Clear to auscultation. Respiratory effort normal. Cardiovascular system: S1 & S2 heard, RRR. No JVD, murmurs Gastrointestinal system: Abdomen is nondistended, soft and nontender. Central nervous system: Alert and oriented. No focal neurological deficits. Extremities: bilateral metatarsal amputations.  Skin: No rashes, lesions or ulcers Psychiatry:. Mood & affect appropriate.    Data Reviewed:  I have personally reviewed following labs and imaging studies   CBC Lab Results  Component Value Date   WBC 8.3 08/20/2022   RBC 2.87 (L) 08/20/2022   HGB 8.3 (L) 08/20/2022   HCT 25.4 (L) 08/20/2022   MCV 88.5 08/20/2022   MCH 28.9 08/20/2022   PLT 297 08/20/2022   MCHC 32.7 08/20/2022   RDW 17.5 (H) 08/20/2022   LYMPHSABS 1.8 08/18/2022   MONOABS 0.6 08/18/2022   EOSABS 0.1  08/18/2022   BASOSABS 0.0 08/18/2022     Last metabolic panel Lab Results  Component Value Date   NA 138 08/20/2022   K 4.9 08/20/2022   CL 104 08/20/2022   CO2 24 08/20/2022   BUN 35 (H) 08/20/2022   CREATININE 1.32 (H) 08/20/2022   GLUCOSE 155 (H) 08/20/2022   GFRNONAA 48 (L) 08/20/2022   GFRAA >60 01/15/2020   CALCIUM 8.4 (L) 08/20/2022   PROT 5.4 (  L) 08/06/2022   ALBUMIN 1.6 (L) 08/06/2022   BILITOT 0.7 08/06/2022   ALKPHOS 207 (H) 08/06/2022   AST 17 08/06/2022   ALT 14 08/06/2022   ANIONGAP 10 08/20/2022    CBG (last 3)  Recent Labs    08/19/22 1832 08/19/22 2108 08/20/22 0726  GLUCAP 173* 173* 111*       Coagulation Profile: Recent Labs  Lab 08/18/22 1402  INR 1.2      Radiology Studies: ECHOCARDIOGRAM COMPLETE  Result Date: 08/19/2022    ECHOCARDIOGRAM REPORT   Patient Name:   JORDYNNE MCCOWN [facility] Date of Exam: 08/19/2022 Medical Rec #:  [id number]              Height:       64.0 in Accession #:    [id number]             Weight:       222.2 lb Date of Birth:  01/06/1966               BSA:          2.046 m Patient Age:    56 years               BP:           114/68 mmHg Patient Gender: F                      HR:           95 bpm. Exam Location:  Inpatient Procedure: 2D Echo, 3D Echo, Cardiac Doppler, Color Doppler and Strain Analysis Indications:    R06.9 DOE; R06.02 SOB  History:        Patient has prior history of Echocardiogram examinations. Risk                 Factors:Diabetes, Hypertension and Former Smoker.  Sonographer:    Dondra Prader RVT RCS Referring Phys: [phone] JACOB J STINSON  Sonographer Comments: Image acquisition challenging due to respiratory motion. Patient was short of breath; did Echo with patient sitting up. IMPRESSIONS  1. Left ventricular ejection fraction, by estimation, is 40 to 45%. The left ventricle has mildly decreased function. The left ventricle demonstrates regional wall motion abnormalities (see scoring diagram/findings for  description). There is mild left ventricular hypertrophy. Left ventricular diastolic parameters are indeterminate.  2. Right ventricular systolic function is normal. The right ventricular size is normal. Tricuspid regurgitation signal is inadequate for assessing PA pressure.  3. Left atrial size was mildly dilated.  4. A small pericardial effusion is present.  5. The mitral valve is degenerative. Severe mitral valve regurgitation. No evidence of mitral stenosis.  6. The aortic valve was not well visualized. There is moderate calcification of the aortic valve. There is moderate thickening of the aortic valve. Aortic valve regurgitation is trivial. Mild aortic valve stenosis. Aortic valve mean gradient measures 10.0 mmHg.  7. The inferior vena cava is dilated in size with <50% respiratory variability, suggesting right atrial pressure of 15 mmHg. FINDINGS  Left Ventricle: Left ventricular ejection fraction, by estimation, is 40 to 45%. The left ventricle has mildly decreased function. The left ventricle demonstrates regional wall motion abnormalities. The left ventricular internal cavity size was normal in size. There is mild left ventricular hypertrophy. Left ventricular diastolic parameters are indeterminate.  LV Wall Scoring: The antero-lateral wall and apical lateral segment are hypokinetic. Right Ventricle: The right ventricular size is normal. No increase in right ventricular wall  thickness. Right ventricular systolic function is normal. Tricuspid regurgitation signal is inadequate for assessing PA pressure. Left Atrium: Left atrial size was mildly dilated. Right Atrium: Right atrial size was normal in size. Pericardium: A small pericardial effusion is present. Mitral Valve: The mitral valve is degenerative in appearance. Mild to moderate mitral annular calcification. Severe mitral valve regurgitation. No evidence of mitral valve stenosis. Tricuspid Valve: The tricuspid valve is normal in structure. Tricuspid  valve regurgitation is trivial. No evidence of tricuspid stenosis. Aortic Valve: The aortic valve was not well visualized. There is moderate calcification of the aortic valve. There is moderate thickening of the aortic valve. Aortic valve regurgitation is trivial. Aortic regurgitation PHT measures 530 msec. Mild aortic stenosis is present. Aortic valve mean gradient measures 10.0 mmHg. Aortic valve peak gradient measures 17.2 mmHg. Aortic valve area, by VTI measures 2.98 cm. Pulmonic Valve: The pulmonic valve was normal in structure. Pulmonic valve regurgitation is trivial. No evidence of pulmonic stenosis. Aorta: The aortic root is normal in size and structure. Venous: The inferior vena cava is dilated in size with less than 50% respiratory variability, suggesting right atrial pressure of 15 mmHg. IAS/Shunts: The interatrial septum was not well visualized.  LEFT VENTRICLE PLAX 2D LVIDd:         5.40 cm LVIDs:         4.00 cm LV PW:         1.10 cm LV IVS:        1.00 cm LVOT diam:     1.80 cm LV SV:         119 LV SV Index:   58 LVOT Area:     2.54 cm  LV Volumes (MOD) LV vol d, MOD A2C: 169.0 ml LV vol d, MOD A4C: 161.0 ml LV vol s, MOD A2C: 62.1 ml LV vol s, MOD A4C: 84.9 ml LV SV MOD A2C:     106.9 ml LV SV MOD A4C:     161.0 ml LV SV MOD BP:      91.8 ml RIGHT VENTRICLE             IVC RV Basal diam:  3.90 cm     IVC diam: 2.20 cm RV Mid diam:    2.50 cm RV S prime:     14.80 cm/s TAPSE (M-mode): 2.2 cm LEFT ATRIUM             Index        RIGHT ATRIUM           Index LA diam:        4.10 cm 2.00 cm/m   RA Area:     11.80 cm LA Vol (A2C):   80.1 ml 39.15 ml/m  RA Volume:   28.60 ml  13.98 ml/m LA Vol (A4C):   46.6 ml 22.78 ml/m LA Biplane Vol: 61.6 ml 30.11 ml/m  AORTIC VALVE                     PULMONIC VALVE AV Area (Vmax):    2.42 cm      PV Vmax:       0.91 m/s AV Area (Vmean):   2.38 cm      PV Peak grad:  3.3 mmHg AV Area (VTI):     2.98 cm AV Vmax:           207.50 cm/s AV Vmean:           149.500 cm/s AV  VTI:            0.400 m AV Peak Grad:      17.2 mmHg AV Mean Grad:      10.0 mmHg LVOT Vmax:         197.00 cm/s LVOT Vmean:        140.000 cm/s LVOT VTI:          0.468 m LVOT/AV VTI ratio: 1.17 AI PHT:            530 msec AR Vena Contracta: 0.40 cm  AORTA Ao Root diam: 3.00 cm Ao Asc diam:  3.30 cm Ao Arch diam: 2.9 cm MITRAL VALVE MV Area (PHT): 4.49 cm       SHUNTS MV Decel Time: 169 msec       Systemic VTI:  0.47 m MR Peak grad:    58.1 mmHg    Systemic Diam: 1.80 cm MR Mean grad:    42.0 mmHg MR Vmax:         381.00 cm/s MR Vmean:        316.0 cm/s MR PISA:         6.28 cm MR PISA Eff ROA: 51 mm MR PISA Radius:  1.00 cm MV E velocity: 133.00 cm/s MV A velocity: 92.70 cm/s MV E/A ratio:  1.43 Weston Brass MD Electronically signed by Weston Brass MD Signature Date/Time: 08/19/2022/5:57:20 PM    Final    CT Angio Chest PE W and/or Wo Contrast  Result Date: 08/18/2022 CLINICAL DATA:  Pulmonary embolism (PE) suspected, high prob EXAM: CT ANGIOGRAPHY CHEST WITH CONTRAST TECHNIQUE: Multidetector CT imaging of the chest was performed using the standard protocol during bolus administration of intravenous contrast. Multiplanar CT image reconstructions and MIPs were obtained to evaluate the vascular anatomy. RADIATION DOSE REDUCTION: This exam was performed according to the departmental dose-optimization program which includes automated exposure control, adjustment of the mA and/or kV according to patient size and/or use of iterative reconstruction technique. CONTRAST:  75mL OMNIPAQUE IOHEXOL 350 MG/ML SOLN COMPARISON:  X-ray 08/18/2022 FINDINGS: Cardiovascular: Satisfactory opacification of the pulmonary arteries to the segmental level. No evidence of pulmonary embolism. Thoracic aorta is nonaneurysmal. Atherosclerotic calcifications of the aorta and coronary arteries. Normal heart size. Trace pericardial effusion. Mediastinum/Nodes: Mildly enlarged mediastinal and bilateral hilar lymph  nodes, largest measuring up to 1.1 cm short axis in the precarinal space (series 6, image 40). No axillary lymphadenopathy. Thyroid gland, trachea, and esophagus demonstrate no significant abnormality. Lungs/Pleura: Small-moderate layering bilateral pleural effusions. Probable compressive atelectasis versus consolidation within the dependent aspects of the right and left lower lobe. Bilateral perihilar ground-glass opacities with interlobular septal thickening. No pneumothorax. Upper Abdomen: No acute abnormality. Musculoskeletal: No chest wall abnormality. No acute or significant osseous findings. Review of the MIP images confirms the above findings. IMPRESSION: 1. No evidence of pulmonary embolism. 2. Findings suggestive of pulmonary edema with small-moderate layering bilateral pleural effusions. 3. Probable compressive atelectasis versus consolidation within the dependent aspects of the lung bases. 4. Mildly enlarged mediastinal and bilateral hilar lymph nodes, likely reactive. 5. Trace pericardial effusion. 6. Aortic and coronary artery atherosclerosis (ICD10-I70.0). Electronically Signed   By: Duanne Guess D.O.   On: 08/18/2022 15:35   DG Chest 2 View  Result Date: 08/18/2022 CLINICAL DATA:  Shortness of breath EXAM: CHEST - 2 VIEW COMPARISON:  None Available. FINDINGS: Normal cardiopericardial silhouette bilateral interstitial changes. Tiny effusions. No pneumothorax or consolidation IMPRESSION: Bilateral interstitial changes with small effusions. Acute process is possible.  Recommend follow-up Electronically Signed   By: Karen Kays M.D.   On: 08/18/2022 14:03       Kathlen Mody M.D. [facility] 08/20/2022, 12:05 PM  Available via Epic secure chat 7am-7pm After 7 pm, please refer to night coverage provider listed on amion.

## 2022-08-20 NOTE — Consult Note (Signed)
Consultation  Referring Provider:  [facility]  Primary Care Physician:  Medicine, [facility] Primary Gastroenterologist:  Gentry Fitz       Reason for Consultation:     acute on chronic anemia  LOS: 2 days          HPI:   Kayla Schmitt is a 57 y.o. female with past medical history significant of type 2 diabetes on insulin, hypertension, hypothyroidism, peripheral artery disease on antiplatelet therapy (Plavix and aspirin), GERD, chronic anemia presents for evaluation of acute on chronic anemia.  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  States last Tuesday (4/16) after she was discharged from the hospital she hit her surgical site while trying to maneuver into a body chair.  This resulted in "profuse bleeding" that continued bleeding for quite a while. Patient denies hematochezia, melena, abdominal pain, nausea, and vomiting. Bleeding achieved hemostasis and has not had further bleeding since.   She then noticed she noticed late last week she was having increased shortness of breath which prompted her visit to the ED. She was admitted for NSTEMI with systolic heart failure.  Patient states her shortness of breath is worse when she lies down.  She states as long as she can sit up she can breathe just fine.  Complains of severe orthopnea which is new for her.  Denies chest pain.  GI consulted for acute on chronic anemia. Presented with hgb of 6.1 (similar to two weeks prior) although baseline appears 9.5-11. Currently 8.3 s/p 2 units pRBCs. BUN 35, Cr. 1.32, GFR 48. No previous EGD/colonoscopy.  Denies family history of GI problems or colon cancer.   Past Medical History:  Diagnosis Date   Arthritis    Critical limb ischemia of both lower extremities    Diabetes mellitus without complication    type 2   GERD (gastroesophageal reflux disease)    Hypercholesteremia    Hypertension    Hypothyroidism    PAD (peripheral artery disease)     PONV (postoperative nausea and vomiting)    Thyroid disease     Surgical History:  She  has a past surgical history that includes Amputation toe; ABDOMINAL AORTOGRAM W/LOWER EXTREMITY (Bilateral, 01/14/2020); PERIPHERAL VASCULAR INTERVENTION (Right, 01/14/2020); PERIPHERAL VASCULAR ATHERECTOMY (Right, 01/14/2020); Wisdom tooth extraction; Skin split graft (Right, 03/18/2020); ABDOMINAL AORTOGRAM W/LOWER EXTREMITY (N/A, 07/05/2022); PERIPHERAL VASCULAR INTERVENTION (07/05/2022); Amputation (Bilateral, 07/20/2022); ABDOMINAL AORTOGRAM W/LOWER EXTREMITY (N/A, 08/02/2022); PERIPHERAL VASCULAR THROMBECTOMY (08/02/2022); PERIPHERAL VASCULAR INTERVENTION (08/02/2022); ABDOMINAL AORTOGRAM W/LOWER EXTREMITY (N/A, 08/06/2022); PERIPHERAL VASCULAR INTERVENTION (08/06/2022); and Amputation (Bilateral, 08/08/2022). Family History:  Her family history includes CAD in an other family member; Diabetes in an other family member. Social History:   reports that she quit smoking about 5 years ago. Her smoking use included cigarettes. She has never used smokeless tobacco. She reports that she does not currently use alcohol. She reports current drug use. Drug: Marijuana.  Prior to Admission medications   Medication Sig Start Date End Date Taking? Authorizing Provider  acetaminophen (TYLENOL) 500 MG tablet Take 500-1,000 mg by mouth every 6 (six) hours as needed for moderate pain.   Yes [provider]  amLODipine (NORVASC) 10 MG tablet Take 10 mg by mouth daily.   Yes [provider]  aspirin 81 MG EC tablet Take 1 tablet by mouth daily.   Yes [provider]  atorvastatin (LIPITOR) 40 MG tablet Take 40 mg by mouth daily. 12/14/19  Yes [provider]  clopidogrel (PLAVIX) 75 MG tablet TAKE 1 TABLET BY MOUTH EVERY DAY WITH BREAKFAST Patient taking differently: Take 75 mg by mouth daily. 02/05/22  Yes Maeola Harman, MD  diphenhydrAMINE HCl, Sleep, (ZZZQUIL) 25 MG CAPS Take 25 mg by mouth at  bedtime as needed (Sleep).   Yes [provider]  famotidine (PEPCID) 20 MG tablet Take 20 mg by mouth daily.   Yes [provider]  gabapentin (NEURONTIN) 300 MG capsule Take 300 mg by mouth at bedtime.   Yes [provider]  ibuprofen (ADVIL) 600 MG tablet Take 1 tablet (600 mg total) by mouth every 6 (six) hours as needed. 02/04/22  Yes Small, Brooke L, PA  insulin degludec (TRESIBA FLEXTOUCH) 100 UNIT/ML FlexTouch Pen Inject 10 Units into the skin at bedtime. Patient taking differently: Inject 12-18 Units into the skin at bedtime. 01/15/20  Yes Danford, Earl Lites, MD  levothyroxine (SYNTHROID) 150 MCG tablet Take 150 mcg by mouth daily. 12/02/19  Yes [provider]  pantoprazole (PROTONIX) 40 MG tablet Take 40 mg by mouth daily. 12/21/19  Yes [provider]  polyethylene glycol (MIRALAX / GLYCOLAX) 17 g packet Take 17 g by mouth daily.   Yes [provider]  zinc sulfate 220 (50 Zn) MG capsule Take 1 capsule (220 mg total) by mouth daily for 9 days. 08/13/22 08/22/22 Yes Rhyne, Ames Coupe, PA-C  nitroGLYCERIN (NITRODUR - DOSED IN MG/24 HR) 0.2 mg/hr patch Place 1 patch (0.2 mg total) onto the skin daily. Patient not taking: Reported on 08/18/2022 07/03/22   Nadara Mustard, MD  pentoxifylline (TRENTAL) 400 MG CR tablet Take 1 tablet (400 mg total) by mouth 3 (three) times daily with meals. Patient not taking: Reported on 08/18/2022 07/03/22   Nadara Mustard, MD  Semaglutide, 1 MG/DOSE, (OZEMPIC, 1 MG/DOSE,) 2 MG/1.5ML SOPN Inject 1 mg into the skin every Saturday.    [provider]    Current Facility-Administered Medications  Medication Dose Route Frequency Provider Last Rate Last Admin   acetaminophen (TYLENOL) tablet 650 mg  650 mg Oral Q4H PRN Kathlen Mody, MD   650 mg at 08/20/22 1610   aspirin EC tablet 81 mg  81 mg Oral Daily Levie Heritage, DO   81 mg at 08/19/22 1005   atorvastatin (LIPITOR) tablet 40 mg  40 mg Oral Daily  Levie Heritage, DO   40 mg at 08/19/22 1005   clopidogrel (PLAVIX) tablet 75 mg  75 mg Oral Daily Levie Heritage, DO   75 mg at 08/19/22 1005   enoxaparin (LOVENOX) injection 40 mg  40 mg Subcutaneous Q24H Randall An M, PA-C   40 mg at 08/19/22 1006   famotidine (PEPCID) tablet 20 mg  20 mg Oral Daily Levie Heritage, DO   20 mg at 08/19/22 1005   furosemide (LASIX) injection 40 mg  40 mg Intravenous BID Levie Heritage, DO   40 mg at 08/19/22 2102   gabapentin (NEURONTIN) capsule 300 mg  300 mg Oral QHS Levie Heritage, DO   300 mg at 08/19/22 2102   insulin aspart (novoLOG) injection 0-15 Units  0-15 Units Subcutaneous TID WC Levie Heritage, DO   3 Units at 08/19/22 1850   insulin aspart (novoLOG) injection 0-5 Units  0-5 Units Subcutaneous QHS Stinson, Jacob J, DO       insulin glargine-yfgn [facility]) injection 12 Units  12 Units Subcutaneous QHS Levie Heritage, DO   12 Units at 08/19/22  2102   levothyroxine (SYNTHROID) tablet 150 mcg  150 mcg Oral Q0600 Levie Heritage, DO   150 mcg at 08/20/22 1610   metoprolol succinate (TOPROL-XL) 24 hr tablet 12.5 mg  12.5 mg Oral Daily Cyndi Bender, NP       ondansetron [facility]) tablet 4 mg  4 mg Oral Q6H PRN Levie Heritage, DO       Or   ondansetron [facility]) injection 4 mg  4 mg Intravenous Q6H PRN Levie Heritage, DO       pantoprazole (PROTONIX) EC tablet 40 mg  40 mg Oral Daily Levie Heritage, DO   40 mg at 08/19/22 1004   polyethylene glycol (MIRALAX / GLYCOLAX) packet 17 g  17 g Oral Daily PRN Levie Heritage, DO       potassium chloride SA (KLOR-CON M) CR tablet 40 mEq  40 mEq Oral BID Dow Adolph N, DO   40 mEq at 08/19/22 2102    Allergies as of 08/18/2022 - Review Complete 08/18/2022  Allergen Reaction Noted   Doxycycline Nausea And Vomiting 01/11/2020   Trental [pentoxifylline] Nausea And Vomiting 03/18/2020   Penicillins Rash 01/11/2020    Review of Systems  Constitutional:  Negative for chills, fever and weight  loss.  HENT:  Negative for hearing loss and tinnitus.   Eyes:  Negative for blurred vision and double vision.  Respiratory:  Positive for shortness of breath. Negative for cough and hemoptysis.   Cardiovascular:  Positive for orthopnea. Negative for chest pain.  Gastrointestinal:  Negative for abdominal pain, blood in stool, constipation, diarrhea, heartburn, melena, nausea and vomiting.  Genitourinary:  Negative for dysuria and urgency.  Musculoskeletal:  Negative for myalgias and neck pain.  Skin:  Negative for itching and rash.  Neurological:  Negative for seizures and loss of consciousness.  Psychiatric/Behavioral:  Negative for depression and suicidal ideas.        Physical Exam:  Vital signs in last 24 hours: Temp:  [98.1 F (36.7 C)-98.3 F (36.8 C)] 98.1 F (36.7 C) (04/22 0607) Pulse Rate:  [84-96] 93 (04/22 0810) Resp:  [18-24] 20 (04/22 0810) BP: (101-114)/(62-70) 107/65 (04/22 0810) SpO2:  [91 %-93 %] 92 % (04/22 0607) Weight:  [101.3 kg] 101.3 kg (04/22 0607) Last BM Date : 08/18/22 Last BM recorded by nurses in past 5 days Stool Type: Type 2 (Lump and sausage like) (08/19/2022  9:30 PM)  Physical Exam Constitutional:      Appearance: She is well-developed and normal weight.  HENT:     Head: Normocephalic and atraumatic.     Nose: Nose normal. No congestion.     Mouth/Throat:     Mouth: Mucous membranes are moist.     Pharynx: Oropharynx is clear.  Eyes:     General: No scleral icterus.    Extraocular Movements: Extraocular movements intact.  Cardiovascular:     Rate and Rhythm: Normal rate and regular rhythm.  Pulmonary:     Effort: Pulmonary effort is normal. No respiratory distress.  Abdominal:     General: Abdomen is flat. Bowel sounds are normal. There is no distension.     Palpations: Abdomen is soft. There is no mass.     Tenderness: There is no abdominal tenderness. There is no guarding or rebound.     Hernia: No hernia is present.   Musculoskeletal:     Cervical back: Normal range of motion and neck supple.     Comments: Bilateral bandages  Skin:  General: Skin is warm and dry.  Neurological:     General: No focal deficit present.     Mental Status: She is oriented to person, place, and time.  Psychiatric:        Mood and Affect: Mood normal.        Behavior: Behavior normal.        Thought Content: Thought content normal.        Judgment: Judgment normal.      LAB RESULTS: Recent Labs    08/18/22 1344 08/19/22 0229 08/19/22 0915 08/20/22 0244  WBC 7.3 7.3  --  8.3  HGB 6.1* 7.5* 7.7* 8.3*  HCT 20.4* 24.1* 24.7* 25.4*  PLT 304 297  --  297   BMET Recent Labs    08/18/22 1344 08/19/22 0228 08/20/22 0244  NA 137 136 138  K 3.9 3.8 4.9  CL 105 103 104  CO2 22 22 24   GLUCOSE 204* 147* 155*  BUN 38* 35* 35*  CREATININE 1.28* 1.41* 1.32*  CALCIUM 8.2* 8.2* 8.4*   LFT No results for input(s): "PROT", "ALBUMIN", "AST", "ALT", "ALKPHOS", "BILITOT", "BILIDIR", "IBILI" in the last 72 hours. PT/INR Recent Labs    08/18/22 1402  LABPROT 15.1  INR 1.2    STUDIES: ECHOCARDIOGRAM COMPLETE  Result Date: 08/19/2022    ECHOCARDIOGRAM REPORT   Patient Name:   Kayla Schmitt [facility] Date of Exam: 08/19/2022 Medical Rec #:  [id number]              Height:       64.0 in Accession #:    [id number]             Weight:       222.2 lb Date of Birth:  [birth year]               BSA:          2.046 m Patient Age:    56 years               BP:           114/68 mmHg Patient Gender: F                      HR:           95 bpm. Exam Location:  Inpatient Procedure: 2D Echo, 3D Echo, Cardiac Doppler, Color Doppler and Strain Analysis Indications:    R06.9 DOE; R06.02 SOB  History:        Patient has prior history of Echocardiogram examinations. Risk                 Factors:Diabetes, Hypertension and Former Smoker.  Sonographer:    Dondra Prader RVT RCS Referring Phys: [phone] JACOB J STINSON  Sonographer Comments: Image  acquisition challenging due to respiratory motion. Patient was short of breath; did Echo with patient sitting up. IMPRESSIONS  1. Left ventricular ejection fraction, by estimation, is 40 to 45%. The left ventricle has mildly decreased function. The left ventricle demonstrates regional wall motion abnormalities (see scoring diagram/findings for description). There is mild left ventricular hypertrophy. Left ventricular diastolic parameters are indeterminate.  2. Right ventricular systolic function is normal. The right ventricular size is normal. Tricuspid regurgitation signal is inadequate for assessing PA pressure.  3. Left atrial size was mildly dilated.  4. A small pericardial effusion is present.  5. The mitral valve is degenerative. Severe mitral valve regurgitation. No evidence of mitral stenosis.  6. The aortic  valve was not well visualized. There is moderate calcification of the aortic valve. There is moderate thickening of the aortic valve. Aortic valve regurgitation is trivial. Mild aortic valve stenosis. Aortic valve mean gradient measures 10.0 mmHg.  7. The inferior vena cava is dilated in size with <50% respiratory variability, suggesting right atrial pressure of 15 mmHg. FINDINGS  Left Ventricle: Left ventricular ejection fraction, by estimation, is 40 to 45%. The left ventricle has mildly decreased function. The left ventricle demonstrates regional wall motion abnormalities. The left ventricular internal cavity size was normal in size. There is mild left ventricular hypertrophy. Left ventricular diastolic parameters are indeterminate.  LV Wall Scoring: The antero-lateral wall and apical lateral segment are hypokinetic. Right Ventricle: The right ventricular size is normal. No increase in right ventricular wall thickness. Right ventricular systolic function is normal. Tricuspid regurgitation signal is inadequate for assessing PA pressure. Left Atrium: Left atrial size was mildly dilated. Right Atrium:  Right atrial size was normal in size. Pericardium: A small pericardial effusion is present. Mitral Valve: The mitral valve is degenerative in appearance. Mild to moderate mitral annular calcification. Severe mitral valve regurgitation. No evidence of mitral valve stenosis. Tricuspid Valve: The tricuspid valve is normal in structure. Tricuspid valve regurgitation is trivial. No evidence of tricuspid stenosis. Aortic Valve: The aortic valve was not well visualized. There is moderate calcification of the aortic valve. There is moderate thickening of the aortic valve. Aortic valve regurgitation is trivial. Aortic regurgitation PHT measures 530 msec. Mild aortic stenosis is present. Aortic valve mean gradient measures 10.0 mmHg. Aortic valve peak gradient measures 17.2 mmHg. Aortic valve area, by VTI measures 2.98 cm. Pulmonic Valve: The pulmonic valve was normal in structure. Pulmonic valve regurgitation is trivial. No evidence of pulmonic stenosis. Aorta: The aortic root is normal in size and structure. Venous: The inferior vena cava is dilated in size with less than 50% respiratory variability, suggesting right atrial pressure of 15 mmHg. IAS/Shunts: The interatrial septum was not well visualized.  LEFT VENTRICLE PLAX 2D LVIDd:         5.40 cm LVIDs:         4.00 cm LV PW:         1.10 cm LV IVS:        1.00 cm LVOT diam:     1.80 cm LV SV:         119 LV SV Index:   58 LVOT Area:     2.54 cm  LV Volumes (MOD) LV vol d, MOD A2C: 169.0 ml LV vol d, MOD A4C: 161.0 ml LV vol s, MOD A2C: 62.1 ml LV vol s, MOD A4C: 84.9 ml LV SV MOD A2C:     106.9 ml LV SV MOD A4C:     161.0 ml LV SV MOD BP:      91.8 ml RIGHT VENTRICLE             IVC RV Basal diam:  3.90 cm     IVC diam: 2.20 cm RV Mid diam:    2.50 cm RV S prime:     14.80 cm/s TAPSE (M-mode): 2.2 cm LEFT ATRIUM             Index        RIGHT ATRIUM           Index LA diam:        4.10 cm 2.00 cm/m   RA Area:     11.80 cm LA Vol (A2C):  80.1 ml 39.15 ml/m  RA  Volume:   28.60 ml  13.98 ml/m LA Vol (A4C):   46.6 ml 22.78 ml/m LA Biplane Vol: 61.6 ml 30.11 ml/m  AORTIC VALVE                     PULMONIC VALVE AV Area (Vmax):    2.42 cm      PV Vmax:       0.91 m/s AV Area (Vmean):   2.38 cm      PV Peak grad:  3.3 mmHg AV Area (VTI):     2.98 cm AV Vmax:           207.50 cm/s AV Vmean:          149.500 cm/s AV VTI:            0.400 m AV Peak Grad:      17.2 mmHg AV Mean Grad:      10.0 mmHg LVOT Vmax:         197.00 cm/s LVOT Vmean:        140.000 cm/s LVOT VTI:          0.468 m LVOT/AV VTI ratio: 1.17 AI PHT:            530 msec AR Vena Contracta: 0.40 cm  AORTA Ao Root diam: 3.00 cm Ao Asc diam:  3.30 cm Ao Arch diam: 2.9 cm MITRAL VALVE MV Area (PHT): 4.49 cm       SHUNTS MV Decel Time: 169 msec       Systemic VTI:  0.47 m MR Peak grad:    58.1 mmHg    Systemic Diam: 1.80 cm MR Mean grad:    42.0 mmHg MR Vmax:         381.00 cm/s MR Vmean:        316.0 cm/s MR PISA:         6.28 cm MR PISA Eff ROA: 51 mm MR PISA Radius:  1.00 cm MV E velocity: 133.00 cm/s MV A velocity: 92.70 cm/s MV E/A ratio:  1.43 Weston Brass MD Electronically signed by Weston Brass MD Signature Date/Time: 08/19/2022/5:57:20 PM    Final    CT Angio Chest PE W and/or Wo Contrast  Result Date: 08/18/2022 CLINICAL DATA:  Pulmonary embolism (PE) suspected, high prob EXAM: CT ANGIOGRAPHY CHEST WITH CONTRAST TECHNIQUE: Multidetector CT imaging of the chest was performed using the standard protocol during bolus administration of intravenous contrast. Multiplanar CT image reconstructions and MIPs were obtained to evaluate the vascular anatomy. RADIATION DOSE REDUCTION: This exam was performed according to the departmental dose-optimization program which includes automated exposure control, adjustment of the mA and/or kV according to patient size and/or use of iterative reconstruction technique. CONTRAST:  75mL OMNIPAQUE IOHEXOL 350 MG/ML SOLN COMPARISON:  X-ray 08/18/2022 FINDINGS:  Cardiovascular: Satisfactory opacification of the pulmonary arteries to the segmental level. No evidence of pulmonary embolism. Thoracic aorta is nonaneurysmal. Atherosclerotic calcifications of the aorta and coronary arteries. Normal heart size. Trace pericardial effusion. Mediastinum/Nodes: Mildly enlarged mediastinal and bilateral hilar lymph nodes, largest measuring up to 1.1 cm short axis in the precarinal space (series 6, image 40). No axillary lymphadenopathy. Thyroid gland, trachea, and esophagus demonstrate no significant abnormality. Lungs/Pleura: Small-moderate layering bilateral pleural effusions. Probable compressive atelectasis versus consolidation within the dependent aspects of the right and left lower lobe. Bilateral perihilar ground-glass opacities with interlobular septal thickening. No pneumothorax. Upper Abdomen: No acute abnormality. Musculoskeletal: No chest wall  abnormality. No acute or significant osseous findings. Review of the MIP images confirms the above findings. IMPRESSION: 1. No evidence of pulmonary embolism. 2. Findings suggestive of pulmonary edema with small-moderate layering bilateral pleural effusions. 3. Probable compressive atelectasis versus consolidation within the dependent aspects of the lung bases. 4. Mildly enlarged mediastinal and bilateral hilar lymph nodes, likely reactive. 5. Trace pericardial effusion. 6. Aortic and coronary artery atherosclerosis (ICD10-I70.0). Electronically Signed   By: Duanne Guess D.O.   On: 08/18/2022 15:35   DG Chest 2 View  Result Date: 08/18/2022 CLINICAL DATA:  Shortness of breath EXAM: CHEST - 2 VIEW COMPARISON:  None Available. FINDINGS: Normal cardiopericardial silhouette bilateral interstitial changes. Tiny effusions. No pneumothorax or consolidation IMPRESSION: Bilateral interstitial changes with small effusions. Acute process is possible. Recommend follow-up Electronically Signed   By: Karen Kays M.D.   On: 08/18/2022 14:03       Impression    Acute on chronic anemia; likely secondary to blood loss from surgical site -Hgb 8.3 (s/p 2 units PRBCs) -BUN elevated due to AKI -Iron 35, ferritin 209 Suspect acute on chronic anemia is due to acute blood loss from surgical site and recent surgery.  Negative Hemoccult.  Suspect anemia is not from GI etiology at this time.  If EGD were to be considered would need to hold Plavix.  Also suspect EGD would be much higher risk first benefit at this time in the setting of recent NSTEMI.  NSTEMI Acute systolic heart failure DMII AKI   Plan   - Continue daily CBC and transfuse as needed to maintain HGB > 7  - If overt bleeding occurs or considerable drop in hemoglobin can reconsider EGD.  Recommend holding off on EGD at this time due to increased risk of complication in the setting of recent NSTEMI, use of Plavix, and no overt GI bleeding. - Need colonoscopy as an outpatient for screening at some point  Thank you for your kind consultation, we will continue to follow.   Amor Packard Leanna Sato  08/20/2022, 8:51 AM

## 2022-08-20 NOTE — Care Management (Signed)
  Transition of Care [facility]) Screening Note   Patient Details  Name: Kayla Schmitt Date of Birth: 1965/08/25   Transition of Care [facility]) CM/SW Contact:    Gala Lewandowsky, RN Phone Number: 08/20/2022, 4:10 PM    Transition of Care Department [facility]) has reviewed the patient and no TOC needs have been identified at this time. Patient is currently active with [facility] for [facility] PT. Patient will need resumption orders once stable. We will continue to monitor patient advancement through interdisciplinary progression rounds. If new patient transition needs arise, please place a TOC consult.

## 2022-08-21 DIAGNOSIS — D649 Anemia, unspecified: Secondary | ICD-10-CM | POA: Diagnosis not present

## 2022-08-21 DIAGNOSIS — I5021 Acute systolic (congestive) heart failure: Secondary | ICD-10-CM | POA: Diagnosis not present

## 2022-08-21 DIAGNOSIS — I214 Non-ST elevation (NSTEMI) myocardial infarction: Secondary | ICD-10-CM | POA: Diagnosis not present

## 2022-08-21 DIAGNOSIS — D62 Acute posthemorrhagic anemia: Secondary | ICD-10-CM | POA: Diagnosis not present

## 2022-08-21 LAB — CBC
HCT: 24.1 % — ABNORMAL LOW (ref 36.0–46.0)
Hemoglobin: 7.5 g/dL — ABNORMAL LOW (ref 12.0–15.0)
MCH: 28 pg (ref 26.0–34.0)
MCHC: 31.1 g/dL (ref 30.0–36.0)
MCV: 89.9 fL (ref 80.0–100.0)
Platelets: 305 10*3/uL (ref 150–400)
RBC: 2.68 MIL/uL — ABNORMAL LOW (ref 3.87–5.11)
RDW: 17.6 % — ABNORMAL HIGH (ref 11.5–15.5)
WBC: 6.6 10*3/uL (ref 4.0–10.5)
nRBC: 0 % (ref 0.0–0.2)

## 2022-08-21 LAB — BASIC METABOLIC PANEL
Anion gap: 6 (ref 5–15)
BUN: 34 mg/dL — ABNORMAL HIGH (ref 6–20)
CO2: 24 mmol/L (ref 22–32)
Calcium: 8.6 mg/dL — ABNORMAL LOW (ref 8.9–10.3)
Chloride: 106 mmol/L (ref 98–111)
Creatinine, Ser: 1.29 mg/dL — ABNORMAL HIGH (ref 0.44–1.00)
GFR, Estimated: 49 mL/min — ABNORMAL LOW (ref >60)
Glucose, Bld: 145 mg/dL — ABNORMAL HIGH (ref 70–99)
Potassium: 4.7 mmol/L (ref 3.5–5.1)
Sodium: 136 mmol/L (ref 135–145)

## 2022-08-21 LAB — GLUCOSE, CAPILLARY
Glucose-Capillary: 115 mg/dL — ABNORMAL HIGH (ref 70–99)
Glucose-Capillary: 164 mg/dL — ABNORMAL HIGH (ref 70–99)
Glucose-Capillary: 136 mg/dL — ABNORMAL HIGH (ref 70–99)
Glucose-Capillary: 150 mg/dL — ABNORMAL HIGH (ref 70–99)

## 2022-08-21 MED ORDER — FUROSEMIDE 10 MG/ML IJ SOLN
60.0000 mg | Freq: Two times a day (BID) | INTRAMUSCULAR | Status: DC
  Administered 2022-08-21 – 2022-08-27 (×13): 60 mg via INTRAVENOUS
  Filled 2022-08-21 (×13): qty 6

## 2022-08-21 NOTE — Progress Notes (Signed)
   Heart Failure Stewardship Pharmacist Progress Note   PCP: [facility], [facility] PCP-Cardiologist: None    HPI:  57 yo F with PMH of T2DM, HTN, hypothyroidism, PAD, GERD and chronic anemia.   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 She had increased bleeding from the surgical sites after discharge.   Presented to the ED on 4/20 with shortness of breath and orthopnea over the last 3 days. CXR showed bilateral interstitial changes with small pleural effusions. CTA showed no evidence of a PE but was noted to have pulmonary edema with small to moderate layering bilateral effusions and probable compressive atelectasis versus consolidation in the lung bases. She was transferred from [facility] to [facility] for further evaluation. Troponin elevation secondary to demand ischemia. ECHO 4/21 with LVEF 40-45%, regional wall motion abnormalities, mild LVH, RV normal. Plan for cath once euvolemic.   Current HF Medications: Diuretic: furosemide 60 mg IV BID Beta Blocker: metoprolol XL 12.5 mg daily  Prior to admission HF Medications: None *also on amlodipine 10 mg daily  Pertinent Lab Values: Serum creatinine 1.29, BUN 34, Potassium 4.7, Sodium 136, BNP 486, A1c 6.4   Vital Signs: Weight: 220 lbs (admission weight: 222 lbs) Blood pressure: 110/70s  Heart rate: 80-90s  I/O: -1.2L yesterday  Medication Assistance / Insurance Benefits Check: Does the patient have prescription insurance?  Yes Type of insurance plan: Chartered loss adjuster  Outpatient Pharmacy:  Prior to admission outpatient pharmacy: [facility] Is the patient willing to use [facility] TOC pharmacy at discharge? Yes Is the patient willing to transition their outpatient pharmacy to utilize a [facility] outpatient pharmacy?   Pending    Assessment: 1. Acute systolic and diastolic CHF (LVEF 40-45%), pending ischemic evaluation. NYHA class IV symptoms. - Agree with increasing to furosemide 60 mg  IV BID. Strict I/Os and daily weights. Keep K>4 and Mg>2. - Continue metoprolol XL 12.5 mg daily - Consider adding ARB/ARNI + MRA + SGLT2i prior to discharge once renal function improved - Agree with holding amlodipine, would not resume at discharge   Plan: 1) Medication changes recommended at this time: - Agree with changes  2) Patient assistance: - Farxiga copay $25 - copay card lowers to $0 per month - Jardiance copay $25 - copay card lowers to $10 per month - Entresto copay $30 - copay card lowers to $10 per fill (30-90 day supply)  3)  Education  - To be completed prior to discharge  Sharen Hones, PharmD, BCPS Heart Failure Stewardship Pharmacist Phone [phone]

## 2022-08-21 NOTE — Progress Notes (Addendum)
Rounding Note    Patient Name: Kayla Schmitt Date of Encounter: 08/21/2022  [facility] Cardiologist: New to Dr Cristal Deer   Subjective   Patient states she feels more SOB today, sitting up feeling better, unable lie flat, urinating well but does not if it's a lot more than usual, no chest pain. She has not been out of bed due to TMA and need assistance to bedside commode and felt quite winded with this.   Inpatient Medications    Scheduled Meds:  aspirin EC  81 mg Oral Daily   atorvastatin  40 mg Oral Daily   clopidogrel  75 mg Oral Daily   enoxaparin (LOVENOX) injection  40 mg Subcutaneous Q24H   famotidine  20 mg Oral Daily   furosemide  60 mg Intravenous BID   gabapentin  300 mg Oral QHS   insulin aspart  0-15 Units Subcutaneous TID WC   insulin aspart  0-5 Units Subcutaneous QHS   insulin glargine-yfgn  12 Units Subcutaneous QHS   levothyroxine  150 mcg Oral Q0600   metoprolol succinate  12.5 mg Oral Daily   pantoprazole  40 mg Oral Daily   potassium chloride  40 mEq Oral BID   Continuous Infusions:  PRN Meds: acetaminophen, ondansetron **OR** ondansetron (ZOFRAN) IV, polyethylene glycol   Vital Signs    Vitals:   08/20/22 1709 08/20/22 1959 08/21/22 0326 08/21/22 0737  BP: 109/73 109/71 116/71 112/71  Pulse: 82 85 86 81  Resp: (!) Temp:  98.2 F (36.8 C) 98 F (36.7 C) 98 F (36.7 C)  TempSrc:  Oral Oral Oral  SpO2:  93% 93% 96%  Weight:   100.1 kg   Height:        Intake/Output Summary (Last 24 hours) at 08/21/2022 0751 Last data filed at 08/21/2022 0455 Gross per 24 hour  Intake 1320 ml  Output 1600 ml  Net -280 ml      08/21/2022    3:26 AM 08/20/2022    6:07 AM 08/19/2022    3:36 AM  Last 3 Weights  Weight (lbs) 220 lb 10.9 oz 223 lb 5.2 oz 222 lb 3.6 oz  Weight (kg) 100.1 kg 101.3 kg 100.8 kg      Telemetry    Sinus rhythm 80-90s, occasional PVCs  - Personally Reviewed  ECG    N/A today- Personally  Reviewed  Physical Exam   GEN: No acute distress.   Neck: no JVD Cardiac: RRR, systolic murmur grade II throughout   Respiratory: Clear to auscultation bilaterally. On [place] oxygen. Speaks full sentence  GI: Soft, nontender, non-distended  MS: BLE 1+ edema, s/p TMA bilaterally, ACE wrap not removed for exam Neuro:  Nonfocal  Psych: Normal affect   Labs    High Sensitivity Troponin:   Recent Labs  Lab 08/18/22 1344 08/18/22 1607 08/19/22 0228  TROPONINIHS 1,096* 1,323* 1,227*     Chemistry Recent Labs  Lab 08/19/22 0228 08/20/22 0244 08/21/22 0321  NA 136 138 136  K 3.8 4.9 4.7  CL 103 104 106  CO2 GLUCOSE 147* 155* 145*  BUN 35* 35* 34*  CREATININE 1.41* 1.32* 1.29*  CALCIUM 8.2* 8.4* 8.6*  GFRNONAA 44* 48* 49*  ANIONGAP Lipids No results for input(s): "CHOL", "TRIG", "HDL", "LABVLDL", "LDLCALC", "CHOLHDL" in the last 168 hours.  Hematology Recent Labs  Lab 08/19/22 0229 08/19/22 0915 08/19/22 1015 08/20/22 0244 08/21/22 0321  WBC 7.3  --   --  8.3 6.6  RBC 2.70*  --  3.00* 2.87* 2.68*  HGB 7.5* 7.7*  --  8.3* 7.5*  HCT 24.1* 24.7*  --  25.4* 24.1*  MCV 89.3  --   --  88.5 89.9  MCH 27.8  --   --  28.9 28.0  MCHC 31.1  --   --  32.7 31.1  RDW 17.4*  --   --  17.5* 17.6*  PLT 297  --   --  297 305   Thyroid No results for input(s): "TSH", "FREET4" in the last 168 hours.  BNP Recent Labs  Lab 08/18/22 1344  BNP 486.0*    DDimer No results for input(s): "DDIMER" in the last 168 hours.   Radiology    ECHOCARDIOGRAM COMPLETE  Result Date: 08/19/2022    ECHOCARDIOGRAM REPORT   Patient Name:   Kayla Schmitt [facility] Date of Exam: 08/19/2022 Medical Rec #:  [id number]              Height:       64.0 in Accession #:    [id number]             Weight:       222.2 lb Date of Birth:  [birth year]               BSA:          2.046 m Patient Age:    56 years               BP:           114/68 mmHg Patient Gender: F                      HR:            95 bpm. Exam Location:  Inpatient Procedure: 2D Echo, 3D Echo, Cardiac Doppler, Color Doppler and Strain Analysis Indications:    R06.9 DOE; R06.02 SOB  History:        Patient has prior history of Echocardiogram examinations. Risk                 Factors:Diabetes, Hypertension and Former Smoker.  Sonographer:    Dondra Prader RVT RCS Referring Phys: [phone] JACOB J STINSON  Sonographer Comments: Image acquisition challenging due to respiratory motion. Patient was short of breath; did Echo with patient sitting up. IMPRESSIONS  1. Left ventricular ejection fraction, by estimation, is 40 to 45%. The left ventricle has mildly decreased function. The left ventricle demonstrates regional wall motion abnormalities (see scoring diagram/findings for description). There is mild left ventricular hypertrophy. Left ventricular diastolic parameters are indeterminate.  2. Right ventricular systolic function is normal. The right ventricular size is normal. Tricuspid regurgitation signal is inadequate for assessing PA pressure.  3. Left atrial size was mildly dilated.  4. A small pericardial effusion is present.  5. The mitral valve is degenerative. Severe mitral valve regurgitation. No evidence of mitral stenosis.  6. The aortic valve was not well visualized. There is moderate calcification of the aortic valve. There is moderate thickening of the aortic valve. Aortic valve regurgitation is trivial. Mild aortic valve stenosis. Aortic valve mean gradient measures 10.0 mmHg.  7. The inferior vena cava is dilated in size with <50% respiratory variability, suggesting right atrial pressure of 15 mmHg. FINDINGS  Left Ventricle: Left ventricular ejection fraction, by estimation, is 40 to 45%. The left ventricle has mildly decreased function. The  left ventricle demonstrates regional wall motion abnormalities. The left ventricular internal cavity size was normal in size. There is mild left ventricular hypertrophy. Left ventricular diastolic  parameters are indeterminate.  LV Wall Scoring: The antero-lateral wall and apical lateral segment are hypokinetic. Right Ventricle: The right ventricular size is normal. No increase in right ventricular wall thickness. Right ventricular systolic function is normal. Tricuspid regurgitation signal is inadequate for assessing PA pressure. Left Atrium: Left atrial size was mildly dilated. Right Atrium: Right atrial size was normal in size. Pericardium: A small pericardial effusion is present. Mitral Valve: The mitral valve is degenerative in appearance. Mild to moderate mitral annular calcification. Severe mitral valve regurgitation. No evidence of mitral valve stenosis. Tricuspid Valve: The tricuspid valve is normal in structure. Tricuspid valve regurgitation is trivial. No evidence of tricuspid stenosis. Aortic Valve: The aortic valve was not well visualized. There is moderate calcification of the aortic valve. There is moderate thickening of the aortic valve. Aortic valve regurgitation is trivial. Aortic regurgitation PHT measures 530 msec. Mild aortic stenosis is present. Aortic valve mean gradient measures 10.0 mmHg. Aortic valve peak gradient measures 17.2 mmHg. Aortic valve area, by VTI measures 2.98 cm. Pulmonic Valve: The pulmonic valve was normal in structure. Pulmonic valve regurgitation is trivial. No evidence of pulmonic stenosis. Aorta: The aortic root is normal in size and structure. Venous: The inferior vena cava is dilated in size with less than 50% respiratory variability, suggesting right atrial pressure of 15 mmHg. IAS/Shunts: The interatrial septum was not well visualized.  LEFT VENTRICLE PLAX 2D LVIDd:         5.40 cm LVIDs:         4.00 cm LV PW:         1.10 cm LV IVS:        1.00 cm LVOT diam:     1.80 cm LV SV:         119 LV SV Index:   58 LVOT Area:     2.54 cm  LV Volumes (MOD) LV vol d, MOD A2C: 169.0 ml LV vol d, MOD A4C: 161.0 ml LV vol s, MOD A2C: 62.1 ml LV vol s, MOD A4C: 84.9 ml LV  SV MOD A2C:     106.9 ml LV SV MOD A4C:     161.0 ml LV SV MOD BP:      91.8 ml RIGHT VENTRICLE             IVC RV Basal diam:  3.90 cm     IVC diam: 2.20 cm RV Mid diam:    2.50 cm RV S prime:     14.80 cm/s TAPSE (M-mode): 2.2 cm LEFT ATRIUM             Index        RIGHT ATRIUM           Index LA diam:        4.10 cm 2.00 cm/m   RA Area:     11.80 cm LA Vol (A2C):   80.1 ml 39.15 ml/m  RA Volume:   28.60 ml  13.98 ml/m LA Vol (A4C):   46.6 ml 22.78 ml/m LA Biplane Vol: 61.6 ml 30.11 ml/m  AORTIC VALVE                     PULMONIC VALVE AV Area (Vmax):    2.42 cm      PV Vmax:       0.91  m/s AV Area (Vmean):   2.38 cm      PV Peak grad:  3.3 mmHg AV Area (VTI):     2.98 cm AV Vmax:           207.50 cm/s AV Vmean:          149.500 cm/s AV VTI:            0.400 m AV Peak Grad:      17.2 mmHg AV Mean Grad:      10.0 mmHg LVOT Vmax:         197.00 cm/s LVOT Vmean:        140.000 cm/s LVOT VTI:          0.468 m LVOT/AV VTI ratio: 1.17 AI PHT:            530 msec AR Vena Contracta: 0.40 cm  AORTA Ao Root diam: 3.00 cm Ao Asc diam:  3.30 cm Ao Arch diam: 2.9 cm MITRAL VALVE MV Area (PHT): 4.49 cm       SHUNTS MV Decel Time: 169 msec       Systemic VTI:  0.47 m MR Peak grad:    58.1 mmHg    Systemic Diam: 1.80 cm MR Mean grad:    42.0 mmHg MR Vmax:         381.00 cm/s MR Vmean:        316.0 cm/s MR PISA:         6.28 cm MR PISA Eff ROA: 51 mm MR PISA Radius:  1.00 cm MV E velocity: 133.00 cm/s MV A velocity: 92.70 cm/s MV E/A ratio:  1.43 Weston Brass MD Electronically signed by Weston Brass MD Signature Date/Time: 08/19/2022/5:57:20 PM    Final     Cardiac Studies    Echo from 08/19/22:  1. Left ventricular ejection fraction, by estimation, is 40 to 45%. The  left ventricle has mildly decreased function. The left ventricle  demonstrates regional wall motion abnormalities (see scoring  diagram/findings for description). There is mild left  ventricular hypertrophy. Left ventricular diastolic  parameters are  indeterminate.   2. Right ventricular systolic function is normal. The right ventricular  size is normal. Tricuspid regurgitation signal is inadequate for assessing  PA pressure.   3. Left atrial size was mildly dilated.   4. A small pericardial effusion is present.   5. The mitral valve is degenerative. Severe mitral valve regurgitation.  No evidence of mitral stenosis.   6. The aortic valve was not well visualized. There is moderate  calcification of the aortic valve. There is moderate thickening of the  aortic valve. Aortic valve regurgitation is trivial. Mild aortic valve  stenosis. Aortic valve mean gradient measures  10.0 mmHg.   7. The inferior vena cava is dilated in size with <50% respiratory  variability, suggesting right atrial pressure of 15 mmHg.   Patient Profile     57 y.o. female with PMH of PAD (s/p multiple interventions and most recent bilateral TMA 08/08/22), HLD, type 2 DM, GERD, hypothyroidism,  cardiology is following for CHF, NSTEMI.   Assessment & Plan    Acute systolic heart failure - Presented to [facility] 08/18/2022 for DOE and orthopnea, following recent discharge 08/13/2022 for critical limb ischemia of bilateral lower extremities s/p bilateral TMA, no chest pain - BNP 486, Hs trop 1096 >1323>1227, Cr 1.28 (baseline around 1), GFR 49, Hgb 6.1, POA - CTA chest 4/20 no PE, pulmonary edema with small to moderate  layering bilateral pleural effusions, probable compressive atelectasis versus consolidation of lung bases, mild enlarged mediastinal and bilateral hilar lymph nodes, trace pericardial effusion, aortic and or coronary artery atherosclerosis -Echocardiogram from 08/19/2022 revealed LVEF 40 to 45%, antero-lateral wall and apical lateral segment are hypokinetic, mild LVH, indeterminate diastolic parameter, normal RV, mild LAE, small pericardial effusion, severe MR, trivial AI, mild AS  -Suspect ischemic cardiomyopathy, recommend cardiac  catheterization once euvolemic  -On IV Lasix 40 mg twice daily, intake and not taken not accurately recorded, weight 222 >223>220ibs, clinically hypervolemic, will increase IV Lasix to  BID today, please track accurate daily weight and I&Os - GDMT: Blood pressure is low and AKI, both are limiting for GDMT, tolerating low dose metoprolol XL 12.5mg  daily,will continue - transfuse to keep Hgb >7   NSTEMI  -See diagnostic as above -Suspect she has underlying CAD, Hs trop elevation maybe demand ischemia from CHF not ACS  -Recommend cardiac catheterization once euvolemic -Medical therapy: already on DAPT with ASA +Plavix, atorvastatin, added metoprolol XL 12.5mg  daily as above   HTN - BP low normal currently , on amlodipine at home /held   PVD s/p TMA bilaterally for critical limb ischemia 08/08/22  Type 2 DM GERD AKI Hypothyroidism  Anemia - per primary team  For questions or updates, please contact [facility] Please consult [email or web address] for contact info under  Signed,  Cyndi Bender, NP  08/21/2022, 7:51 AM    Patient seen and examined, note reviewed with the signed Advanced Practice Provider. I personally reviewed laboratory data, imaging studies and relevant notes. I independently examined the patient and formulated the important aspects of the plan. I have personally discussed the plan with the patient and/or family. Comments or changes to the note/plan are indicated below.  Agree with increasing her Lasix today.  She still is not able to tolerate lying flat.  Hoping that this can be able to prove her clinical status to pursue with her ischemic evaluation.  She will benefit from ischemic evaluation with her elevated troponin and wall motion abnormalities, therefore once the patient has improved clinically we will plan for cardiac catheterization later this week. For now continue her dual antiplatelet therapy with aspirin and statin, atorvastatin, agree with low-dose  beta-blocker for now. Will have to monitor her kidney function closely creatinine slightly trending up.  Track daily weights with input and output. Have been discussed with the patient she is in agreement.  All of her questions has been answered.  Thomasene Ripple DO, MS [facility] Attending Cardiologist [facility]  [address] [place], [place] [address] [phone] Website: [email or web address]

## 2022-08-21 NOTE — Progress Notes (Signed)
[facility]                                                                               Kayla Schmitt, is a 57 y.o. female, DOB - 09/14/1965, ZOX:[id number] Admit date - 08/18/2022    Outpatient Primary MD for the patient is Medicine, [facility]  LOS - 3  days    Brief summary   Kayla Schmitt is a 57 y.o. female with medical history significant of type 2 diabetes on insulin, hypertension, hypothyroidism, peripheral artery disease on antiplatelet therapy, GERD, chronic anemia.  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  The patient reports having increased bleeding when she went home from the surgical incision sites.  She reports bleeding a fair amount, but did not come back to the hospital for evaluation.  Few days ago, the bleeding stopped and has remained hemostatic for the past several days.  She has been noting increased difficulty breathing and shortness of breath.  She is having difficulty laying down flat.  She was admitted for NSTEMI with systolic heart failure.   Assessment & Plan    Assessment and Plan:  Acute respiratory failure with hypoxia secondary to acute systolic heart failure/ NSTEMI Still requiring upto 4 lit of [place] Oxygen. She was initially started on lasix 40 mg bid, increased to 60 mg IV BID, monitor urine output.  ECHO showed LVEF of 40 to 45%, mildly decreased function, with regional wall motion abnormalities. Diastolic parameters are indeterminate. Severe MVR. Cardiology on board , suspect ischemic cardiomyopathy, recommend cardiac cath once euvolemic.  Elevated troponins probably from demand ischemia from CHF and anemia.  Currently on aspirin and plavix and lipitor.    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  S/p 1 unit of prbc transfusion. Post transfusion hemoglobin is 7.7, improved to 8.3 and dropped back to  7.5. Stool for occult blood is negative on admission. She denies any melena or use of NSAIDS.  Anemia panel not significant.  Baseline hemoglobin  between 9.8 to 11 in 2020 and 10.1 in 01/2022.  GI consulted, recommended outpatient work up for now.  Transfuse to keep hemoglobin  greater than 7.    Insulin dependent DM  CBG (last 3)  Recent Labs    08/20/22 2113 08/21/22 0836 08/21/22 1203  GLUCAP 137* 115* 164*    Resume SSI and semglee 12 units at bedtime.  No changes in meds.    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  Wound care as per Dr Lajoyce Corners.   Critical limb ischemia / PVD 4/4- Left SFA and above knee popliteal artery mechanical thrombectomy, with angioplasty and stenting of left above knee popliteal artery and SFA   4/8- Right SFA and above knee popliteal artery laser atherectomy, with DCBA of the right above knee popliteal artery and SFA. She was discharged on aspirin and plavix on 4/15 by vascular surgery.  Continue with aspirin and plavix and lipitor.     Hypothyroidism:  Resume synthroid.   AKI;  Looks like her baseline creatinine is less than  1, admitted with a creatinine of 1.41, improved to 1.29 today. Continue to monitor.    Estimated body mass index is 37.88 kg/m as calculated from the following:   Height as of this encounter: 5\' 4"  (1.626 m).   Weight as of this encounter: 100.1 kg.  Code Status: full code.  DVT Prophylaxis:  enoxaparin (LOVENOX) injection 40 mg Start: 08/19/22 0945   Level of Care: Level of care: Progressive Family Communication: none at bedside.   Disposition Plan:     Remains inpatient appropriate:  systolic heart failure , NSTEMI.  Procedures:  Echocardiogram.   Consultants:   Cardiology.  Gastroenterology.   Antimicrobials:   Anti-infectives (From admission, onward)    None        Medications  Scheduled Meds:  aspirin EC  81 mg Oral Daily    atorvastatin  40 mg Oral Daily   clopidogrel  75 mg Oral Daily   enoxaparin (LOVENOX) injection  40 mg Subcutaneous Q24H   famotidine  20 mg Oral Daily   furosemide  60 mg Intravenous BID   gabapentin  300 mg Oral QHS   insulin aspart  0-15 Units Subcutaneous TID WC   insulin aspart  0-5 Units Subcutaneous QHS   insulin glargine-yfgn  12 Units Subcutaneous QHS   levothyroxine  150 mcg Oral Q0600   metoprolol succinate  12.5 mg Oral Daily   pantoprazole  40 mg Oral Daily   potassium chloride  40 mEq Oral BID   Continuous Infusions: PRN Meds:.acetaminophen, ondansetron **OR** ondansetron (ZOFRAN) IV, polyethylene glycol    Subjective:   Kayla Schmitt was seen and examined today. Sob , orthopnea, , no chest pain. No melena.   Objective:   Vitals:   08/21/22 1200 08/21/22 1300 08/21/22 1400 08/21/22 1430  BP:      Pulse: 86 92 85 79  Resp: (!) 22 19 20 20   Temp:      TempSrc:      SpO2: 100% 100% 100% 99%  Weight:      Height:        Intake/Output Summary (Last 24 hours) at 08/21/2022 1453 Last data filed at 08/21/2022 1108 Gross per 24 hour  Intake 1020 ml  Output 1700 ml  Net -680 ml    Filed Weights   08/19/22 0336 08/20/22 0607 08/21/22 0326  Weight: 100.8 kg 101.3 kg 100.1 kg     Exam General exam: Appears calm and comfortable  Respiratory system: Clear to auscultation. Respiratory effort normal. Cardiovascular system: S1 & S2 heard, RRR. No JVD, 2+ leg edema.  Gastrointestinal system: Abdomen is nondistended, soft and nontender. Central nervous system: Alert and oriented. No focal neurological deficits. Extremities: bilateral transmetatarsal amputations, bandaged.  Skin: No rashes, lesions or ulcers Psychiatry: mood is appropriate.     Data Reviewed:  I have personally reviewed following labs and imaging studies   CBC Lab Results  Component Value Date   WBC 6.6 08/21/2022   RBC 2.68 (L) 08/21/2022   HGB 7.5 (L) 08/21/2022   HCT 24.1 (L)  08/21/2022   MCV 89.9 08/21/2022   MCH 28.0 08/21/2022   PLT 305 08/21/2022   MCHC 31.1 08/21/2022   RDW 17.6 (H) 08/21/2022   LYMPHSABS 1.8 08/18/2022   MONOABS 0.6 08/18/2022   EOSABS 0.1 08/18/2022   BASOSABS 0.0 08/18/2022     Last metabolic panel Lab Results  Component Value Date   NA 136 08/21/2022   K 4.7 08/21/2022   CL 106 08/21/2022  CO2 24 08/21/2022   BUN 34 (H) 08/21/2022   CREATININE 1.29 (H) 08/21/2022   GLUCOSE 145 (H) 08/21/2022   GFRNONAA 49 (L) 08/21/2022   GFRAA >60 01/15/2020   CALCIUM 8.6 (L) 08/21/2022   PROT 5.4 (L) 08/06/2022   ALBUMIN 1.6 (L) 08/06/2022   BILITOT 0.7 08/06/2022   ALKPHOS 207 (H) 08/06/2022   AST 17 08/06/2022   ALT 14 08/06/2022   ANIONGAP 6 08/21/2022    CBG (last 3)  Recent Labs    08/20/22 2113 08/21/22 0836 08/21/22 1203  GLUCAP 137* 115* 164*       Coagulation Profile: Recent Labs  Lab 08/18/22 1402  INR 1.2      Radiology Studies: ECHOCARDIOGRAM COMPLETE  Result Date: 08/19/2022    ECHOCARDIOGRAM REPORT   Patient Name:   JACQUILINE ZURCHER [facility] Date of Exam: 08/19/2022 Medical Rec #:  [id number]              Height:       64.0 in Accession #:    [id number]             Weight:       222.2 lb Date of Birth:  10/24/1965               BSA:          2.046 m Patient Age:    56 years               BP:           114/68 mmHg Patient Gender: F                      HR:           95 bpm. Exam Location:  Inpatient Procedure: 2D Echo, 3D Echo, Cardiac Doppler, Color Doppler and Strain Analysis Indications:    R06.9 DOE; R06.02 SOB  History:        Patient has prior history of Echocardiogram examinations. Risk                 Factors:Diabetes, Hypertension and Former Smoker.  Sonographer:    Dondra Prader RVT RCS Referring Phys: [phone] JACOB J STINSON  Sonographer Comments: Image acquisition challenging due to respiratory motion. Patient was short of breath; did Echo with patient sitting up. IMPRESSIONS  1. Left ventricular ejection  fraction, by estimation, is 40 to 45%. The left ventricle has mildly decreased function. The left ventricle demonstrates regional wall motion abnormalities (see scoring diagram/findings for description). There is mild left ventricular hypertrophy. Left ventricular diastolic parameters are indeterminate.  2. Right ventricular systolic function is normal. The right ventricular size is normal. Tricuspid regurgitation signal is inadequate for assessing PA pressure.  3. Left atrial size was mildly dilated.  4. A small pericardial effusion is present.  5. The mitral valve is degenerative. Severe mitral valve regurgitation. No evidence of mitral stenosis.  6. The aortic valve was not well visualized. There is moderate calcification of the aortic valve. There is moderate thickening of the aortic valve. Aortic valve regurgitation is trivial. Mild aortic valve stenosis. Aortic valve mean gradient measures 10.0 mmHg.  7. The inferior vena cava is dilated in size with <50% respiratory variability, suggesting right atrial pressure of 15 mmHg. FINDINGS  Left Ventricle: Left ventricular ejection fraction, by estimation, is 40 to 45%. The left ventricle has mildly decreased function. The left ventricle demonstrates regional wall motion abnormalities. The left ventricular internal cavity size was  normal in size. There is mild left ventricular hypertrophy. Left ventricular diastolic parameters are indeterminate.  LV Wall Scoring: The antero-lateral wall and apical lateral segment are hypokinetic. Right Ventricle: The right ventricular size is normal. No increase in right ventricular wall thickness. Right ventricular systolic function is normal. Tricuspid regurgitation signal is inadequate for assessing PA pressure. Left Atrium: Left atrial size was mildly dilated. Right Atrium: Right atrial size was normal in size. Pericardium: A small pericardial effusion is present. Mitral Valve: The mitral valve is degenerative in appearance. Mild  to moderate mitral annular calcification. Severe mitral valve regurgitation. No evidence of mitral valve stenosis. Tricuspid Valve: The tricuspid valve is normal in structure. Tricuspid valve regurgitation is trivial. No evidence of tricuspid stenosis. Aortic Valve: The aortic valve was not well visualized. There is moderate calcification of the aortic valve. There is moderate thickening of the aortic valve. Aortic valve regurgitation is trivial. Aortic regurgitation PHT measures 530 msec. Mild aortic stenosis is present. Aortic valve mean gradient measures 10.0 mmHg. Aortic valve peak gradient measures 17.2 mmHg. Aortic valve area, by VTI measures 2.98 cm. Pulmonic Valve: The pulmonic valve was normal in structure. Pulmonic valve regurgitation is trivial. No evidence of pulmonic stenosis. Aorta: The aortic root is normal in size and structure. Venous: The inferior vena cava is dilated in size with less than 50% respiratory variability, suggesting right atrial pressure of 15 mmHg. IAS/Shunts: The interatrial septum was not well visualized.  LEFT VENTRICLE PLAX 2D LVIDd:         5.40 cm LVIDs:         4.00 cm LV PW:         1.10 cm LV IVS:        1.00 cm LVOT diam:     1.80 cm LV SV:         119 LV SV Index:   58 LVOT Area:     2.54 cm  LV Volumes (MOD) LV vol d, MOD A2C: 169.0 ml LV vol d, MOD A4C: 161.0 ml LV vol s, MOD A2C: 62.1 ml LV vol s, MOD A4C: 84.9 ml LV SV MOD A2C:     106.9 ml LV SV MOD A4C:     161.0 ml LV SV MOD BP:      91.8 ml RIGHT VENTRICLE             IVC RV Basal diam:  3.90 cm     IVC diam: 2.20 cm RV Mid diam:    2.50 cm RV S prime:     14.80 cm/s TAPSE (M-mode): 2.2 cm LEFT ATRIUM             Index        RIGHT ATRIUM           Index LA diam:        4.10 cm 2.00 cm/m   RA Area:     11.80 cm LA Vol (A2C):   80.1 ml 39.15 ml/m  RA Volume:   28.60 ml  13.98 ml/m LA Vol (A4C):   46.6 ml 22.78 ml/m LA Biplane Vol: 61.6 ml 30.11 ml/m  AORTIC VALVE                     PULMONIC VALVE AV Area  (Vmax):    2.42 cm      PV Vmax:       0.91 m/s AV Area (Vmean):   2.38 cm      PV  Peak grad:  3.3 mmHg AV Area (VTI):     2.98 cm AV Vmax:           207.50 cm/s AV Vmean:          149.500 cm/s AV VTI:            0.400 m AV Peak Grad:      17.2 mmHg AV Mean Grad:      10.0 mmHg LVOT Vmax:         197.00 cm/s LVOT Vmean:        140.000 cm/s LVOT VTI:          0.468 m LVOT/AV VTI ratio: 1.17 AI PHT:            530 msec AR Vena Contracta: 0.40 cm  AORTA Ao Root diam: 3.00 cm Ao Asc diam:  3.30 cm Ao Arch diam: 2.9 cm MITRAL VALVE MV Area (PHT): 4.49 cm       SHUNTS MV Decel Time: 169 msec       Systemic VTI:  0.47 m MR Peak grad:    58.1 mmHg    Systemic Diam: 1.80 cm MR Mean grad:    42.0 mmHg MR Vmax:         381.00 cm/s MR Vmean:        316.0 cm/s MR PISA:         6.28 cm MR PISA Eff ROA: 51 mm MR PISA Radius:  1.00 cm MV E velocity: 133.00 cm/s MV A velocity: 92.70 cm/s MV E/A ratio:  1.43 Weston Brass MD Electronically signed by Weston Brass MD Signature Date/Time: 08/19/2022/5:57:20 PM    Final        Kathlen Mody M.D. [facility] 08/21/2022, 2:53 PM  Available via Epic secure chat 7am-7pm After 7 pm, please refer to night coverage provider listed on amion.

## 2022-08-22 ENCOUNTER — Telehealth: Payer: Self-pay | Admitting: Orthopedic Surgery

## 2022-08-22 DIAGNOSIS — I214 Non-ST elevation (NSTEMI) myocardial infarction: Secondary | ICD-10-CM | POA: Diagnosis not present

## 2022-08-22 LAB — BASIC METABOLIC PANEL
Anion gap: 6 (ref 5–15)
BUN: 28 mg/dL — ABNORMAL HIGH (ref 6–20)
CO2: 27 mmol/L (ref 22–32)
Calcium: 8.6 mg/dL — ABNORMAL LOW (ref 8.9–10.3)
Chloride: 104 mmol/L (ref 98–111)
Creatinine, Ser: 1.17 mg/dL — ABNORMAL HIGH (ref 0.44–1.00)
GFR, Estimated: 55 mL/min — ABNORMAL LOW (ref >60)
Glucose, Bld: 125 mg/dL — ABNORMAL HIGH (ref 70–99)
Potassium: 4.7 mmol/L (ref 3.5–5.1)
Sodium: 137 mmol/L (ref 135–145)

## 2022-08-22 LAB — GLUCOSE, CAPILLARY
Glucose-Capillary: 100 mg/dL — ABNORMAL HIGH (ref 70–99)
Glucose-Capillary: 154 mg/dL — ABNORMAL HIGH (ref 70–99)
Glucose-Capillary: 144 mg/dL — ABNORMAL HIGH (ref 70–99)
Glucose-Capillary: 178 mg/dL — ABNORMAL HIGH (ref 70–99)

## 2022-08-22 LAB — CBC
HCT: 24.6 % — ABNORMAL LOW (ref 36.0–46.0)
Hemoglobin: 7.5 g/dL — ABNORMAL LOW (ref 12.0–15.0)
MCH: 28 pg (ref 26.0–34.0)
MCHC: 30.5 g/dL (ref 30.0–36.0)
MCV: 91.8 fL (ref 80.0–100.0)
Platelets: 301 10*3/uL (ref 150–400)
RBC: 2.68 MIL/uL — ABNORMAL LOW (ref 3.87–5.11)
RDW: 17.5 % — ABNORMAL HIGH (ref 11.5–15.5)
WBC: 5.8 10*3/uL (ref 4.0–10.5)
nRBC: 0 % (ref 0.0–0.2)

## 2022-08-22 MED ORDER — SODIUM CHLORIDE 0.9 % IV SOLN: 250.0000 mL | INTRAVENOUS | Status: DC | PRN

## 2022-08-22 MED ORDER — SODIUM CHLORIDE 0.9 % IV SOLN: INTRAVENOUS | Status: DC

## 2022-08-22 MED ORDER — SODIUM CHLORIDE 0.9% FLUSH: 3.0000 mL | INTRAVENOUS | Status: DC | PRN

## 2022-08-22 MED ORDER — SODIUM CHLORIDE 0.9% FLUSH
3.0000 mL | Freq: Two times a day (BID) | INTRAVENOUS | Status: DC
  Administered 2022-08-22 – 2022-08-26 (×9): 3 mL via INTRAVENOUS

## 2022-08-22 NOTE — Telephone Encounter (Signed)
Please see message below pt is recent bilateral transmet amputations admitted to the hospital with SOB and low HGB. S/p transfusion on O2 had bilat pleural effusions. Pt wants to know if you can see her while there as she missed her post op appt. In the ER note it states that once d/c from hospital after surgery she did hit her foot and was having increased bleeding. FYI

## 2022-08-22 NOTE — Progress Notes (Addendum)
 Rounding Note    Patient Name: Kayla Schmitt Date of Encounter: 08/22/2022  [facility] Cardiologist: Bridgette Christopher, MD (new)   Subjective   Breathing is improving. She can't quite lay flat yet. No chest pain.   Inpatient Medications    Scheduled Meds:  aspirin EC  81 mg Oral Daily   atorvastatin  40 mg Oral Daily   clopidogrel  75 mg Oral Daily   enoxaparin (LOVENOX) injection  40 mg Subcutaneous Q24H   famotidine  20 mg Oral Daily   furosemide  60 mg Intravenous BID   gabapentin  300 mg Oral QHS   insulin aspart  0-15 Units Subcutaneous TID WC   insulin aspart  0-5 Units Subcutaneous QHS   insulin glargine-yfgn  12 Units Subcutaneous QHS   levothyroxine  150 mcg Oral Q0600   metoprolol succinate  12.5 mg Oral Daily   pantoprazole  40 mg Oral Daily   Continuous Infusions:  PRN Meds: acetaminophen, ondansetron **OR** ondansetron (ZOFRAN) IV, polyethylene glycol   Vital Signs    Vitals:   08/21/22 1800 08/21/22 2046 08/22/22 0424 08/22/22 0810  BP: 111/68 103/63 107/66 114/66  Pulse: 90 81 82 82  Resp: 19 (!) 22 (!) 23 20  Temp:  98.3 F (36.8 C) 98 F (36.7 C) 98 F (36.7 C)  TempSrc:  Oral Oral Oral  SpO2: 98% 99% 97% 97%  Weight:   99.1 kg   Height:        Intake/Output Summary (Last 24 hours) at 08/22/2022 0832 Last data filed at 08/22/2022 0008 Gross per 24 hour  Intake 180 ml  Output 2700 ml  Net -2520 ml      08/22/2022    4:24 AM 08/21/2022    3:26 AM 08/20/2022    6:07 AM  Last 3 Weights  Weight (lbs) 218 lb 7.6 oz 220 lb 10.9 oz 223 lb 5.2 oz  Weight (kg) 99.1 kg 100.1 kg 101.3 kg      Telemetry    NSR  - Personally Reviewed  ECG    No new tracings since 4/22 - Personally Reviewed  Physical Exam   GEN: No acute distress. Sitting upright on the side of the bed eating breakfast.   Neck: No JVD Cardiac: RRR, grade 3/6 systolic murmur. Radial pulses 2+ bilaterally   Respiratory: Crackles in bilateral lung  bases  GI: Soft, nontender, non-distended  MS: 2+ edema in BLE; No deformity. Neuro:  Nonfocal  Psych: Normal affect   Labs    High Sensitivity Troponin:   Recent Labs  Lab 08/18/22 1344 08/18/22 1607 08/19/22 0228  TROPONINIHS 1,096* 1,323* 1,227*     Chemistry Recent Labs  Lab 08/20/22 0244 08/21/22 0321 08/22/22 0213  NA 138 136 137  K 4.9 4.7 4.7  CL 104 106 104  CO2 24 24 27  GLUCOSE 155* 145* 125*  BUN 35* 34* 28*  CREATININE 1.32* 1.29* 1.17*  CALCIUM 8.4* 8.6* 8.6*  GFRNONAA 48* 49* 55*  ANIONGAP 10 6 6    Lipids No results for input(s): "CHOL", "TRIG", "HDL", "LABVLDL", "LDLCALC", "CHOLHDL" in the last 168 hours.  Hematology Recent Labs  Lab 08/20/22 0244 08/21/22 0321 08/22/22 0213  WBC 8.3 6.6 5.8  RBC 2.87* 2.68* 2.68*  HGB 8.3* 7.5* 7.5*  HCT 25.4* 24.1* 24.6*  MCV 88.5 89.9 91.8  MCH 28.9 28.0 28.0  MCHC 32.7 31.1 30.5  RDW 17.5* 17.6* 17.5*  PLT 297 305 301   Thyroid No results   for input(s): "TSH", "FREET4" in the last 168 hours.  BNP Recent Labs  Lab 08/18/22 1344  BNP 486.0*    DDimer No results for input(s): "DDIMER" in the last 168 hours.   Radiology    No results found.  Cardiac Studies   Echocardiogram 08/19/22  1. Left ventricular ejection fraction, by estimation, is 40 to 45%. The  left ventricle has mildly decreased function. The left ventricle  demonstrates regional wall motion abnormalities (see scoring  diagram/findings for description). There is mild left  ventricular hypertrophy. Left ventricular diastolic parameters are  indeterminate.   2. Right ventricular systolic function is normal. The right ventricular  size is normal. Tricuspid regurgitation signal is inadequate for assessing  PA pressure.   3. Left atrial size was mildly dilated.   4. A small pericardial effusion is present.   5. The mitral valve is degenerative. Severe mitral valve regurgitation.  No evidence of mitral stenosis.   6. The aortic valve  was not well visualized. There is moderate  calcification of the aortic valve. There is moderate thickening of the  aortic valve. Aortic valve regurgitation is trivial. Mild aortic valve  stenosis. Aortic valve mean gradient measures  10.0 mmHg.   7. The inferior vena cava is dilated in size with <50% respiratory  variability, suggesting right atrial pressure of 15 mmHg.   Patient Profile     57 y.o. female with PMH of PAD (s/p multiple interventions and most recent bilateral TMA 08/08/22), HLD, type 2 DM, GERD, hypothyroidism, cardiology is following for CHF, NSTEMI.   Assessment & Plan    Acute Systolic Heart Failure Severe Mitral Valve Regurgitation  -Patient presented to [place] ER on 4/20 complaining of dyspnea on exertion and orthopnea.  BNP was elevated to 486. hsTn 1096>1323>1227 -CTA chest on 4/20 showed no PE.  Did show pulmonary edema with small-moderate layering bilateral pleural effusions, probable compressive atelectasis versus consolidation of lung bases, mild enlarged mediastinal and bilateral hilar lymph nodes, trace pericardial effusion, aortic and coronary artery atherosclerosis. -Echocardiogram this admission showed EF 40-45% with regional wall motion abnormalities, mild LVH, normal RV systolic function, small pericardial effusion, severe mitral valve regurgitation, mild aortic valve stenosis - Echocardiogram from 2021 showed normal mitral valve function. Severe mitral valve regurgitation is newly discovered  - Patient has been diuresing on IV lasix- output 2.7 L urine yesterday. Weight down from 223.22 lbs to 218.48 lbs. Renal function stable. Continues to have crackles in lungs, ankle edema, orthopnea   - Continue IV lasix 60 mg BID - Suspect ischemic cardiomyopathy. Plan for cardiac catheterization once euvolemic. May be able to do tomorrow  - BP somewhat low, which limits GDMT. Continue metoprolol succinate 12.5 mg. Plan to add SGLT2i after cath   NSTEMI  -  1096>1323>1227 - Given patient's extensive history of PAD, suspect patient also has underlying CAD. With wall motion abnormalities on Echo, concerned for ischemic cardiomyopathy  - Plan for cardiac catheterization once patient euvolemic  - She cannot quite lay flat, but is diuresing well. May be able to cath tomorrow.  - Continue metoprolol, ASA, plavix, lipitor    HTN  - Home amlodipine held to allow titration of GDMT - Continue metoprolol succinate 12.5 mg daily   HLD  - Lipid panel 4/5 showed Ldl 54 - Continue lipitor 40 mg daily  For questions or updates, please contact [facility] Please consult [email or web address] for contact info under    Signed, Kathleen R. Johnson, PA-C  08/22/2022, 8:32   AM   Patient seen and examined, note reviewed with the signed Advanced Practice Provider. I personally reviewed laboratory data, imaging studies and relevant notes. I independently examined the patient and formulated the important aspects of the plan. I have personally discussed the plan with the patient and/or family. Comments or changes to the note/plan are indicated below.  Clinically improving but still short of breath.  Not completely able to lie flat.  Hopefully she will be ready for catheterization tomorrow.  Will get her on the Schedule. All of her current status has been answered. Continue current regimen.  Continue current Lasix dose.  Harvin Konicek DO, MS FACC Attending Cardiologist   [facility]  [address] [place], [place] [address] [phone] Website: [email or web address]  

## 2022-08-22 NOTE — Progress Notes (Signed)
   Heart Failure Stewardship Pharmacist Progress Note   PCP: [facility], [facility] PCP-Cardiologist: Jodelle Red, MD    HPI:  57 yo F with PMH of T2DM, HTN, hypothyroidism, PAD, GERD and chronic anemia.   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 She had increased bleeding from the surgical sites after discharge.   Presented to the ED on 4/20 with shortness of breath and orthopnea over the last 3 days. CXR showed bilateral interstitial changes with small pleural effusions. CTA showed no evidence of a PE but was noted to have pulmonary edema with small to moderate layering bilateral effusions and probable compressive atelectasis versus consolidation in the lung bases. She was transferred from [facility] to [facility] for further evaluation. Troponin elevation secondary to demand ischemia. ECHO 4/21 with LVEF 40-45%, regional wall motion abnormalities, mild LVH, RV normal. Plan for cath once euvolemic, tentatively for tomorrow.   Current HF Medications: Diuretic: furosemide 60 mg IV BID Beta Blocker: metoprolol XL 12.5 mg daily  Prior to admission HF Medications: None *also on amlodipine 10 mg daily  Pertinent Lab Values: Serum creatinine 1.17, BUN 28, Potassium 4.7, Sodium 137, BNP 486, A1c 6.4   Vital Signs: Weight: 218 lbs (admission weight: 222 lbs) Blood pressure: 110/60s  Heart rate: 80s  I/O: -1.9L yesterday; net -2.6L  Medication Assistance / Insurance Benefits Check: Does the patient have prescription insurance?  Yes Type of insurance plan: Chart[employer] loss adjuster  Outpatient Pharmacy:  Prior to admission outpatient pharmacy: [facility] Is the patient willing to use [facility] TOC pharmacy at discharge? Yes Is the patient willing to transition their outpatient pharmacy to utilize a [facility] outpatient pharmacy?   Pending    Assessment: 1. Acute systolic and diastolic CHF (LVEF 40-45%), pending ischemic evaluation. NYHA class IV  symptoms. - Continue furosemide 60 mg IV BID. Strict I/Os and daily weights. Keep K>4 and Mg>2. - Continue metoprolol XL 12.5 mg daily - May be able to add low dose ARB and MRA once off IV lasix - Consider adding SGLT2i after cath - Agree with holding amlodipine, would not resume at discharge   Plan: 1) Medication changes recommended at this time: - Continue current regimen  2) Patient assistance: - Farxiga copay $25 - copay card lowers to $0 per month - Jardiance copay $25 - copay card lowers to $10 per month - Entresto copay $30 - copay card lowers to $10 per fill (30-90 day supply)  3)  Education  - To be completed prior to discharge  Sharen Hones, PharmD, BCPS Heart Failure Stewardship Pharmacist Phone [phone]

## 2022-08-22 NOTE — Telephone Encounter (Signed)
Pt called states she is in [facility] and had to cancel post op appt. Pt is asking for Dr Lajoyce Corners to stop by tomorrow 4/25 and see her to check her incision . Pt states she is in Rm 6E 27. Pt is asking for a call back to confirm that Dr Lajoyce Corners stop in to see her tomorrow before of after his surgeries. Pt phone number is [phone].

## 2022-08-22 NOTE — Progress Notes (Signed)
PROGRESS NOTE    Kayla Schmitt  ZOX:[id number] DOB: Nov 29, 1965 DOA: 08/18/2022  PCP: [facility]   Brief Narrative:  This 57 y.o. female with medical history significant of type 2 diabetes on insulin, hypertension, hypothyroidism, peripheral artery disease on antiplatelet therapy, GERD, chronic anemia.  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  The patient reports having increased bleeding when she went home from the surgical incision sites.  She reports bleeding a fair amount, but did not come back to the hospital for evaluation.  Few days ago, the bleeding stopped and has remained hemostatic for the past several days.  She has been noting increased difficulty breathing and shortness of breath.  She is having difficulty laying down flat.  She is admitted for NSTEMI with systolic heart failure.  Cardiology is consulted, Plan is to have left heart cath once he is euvolemic.  Assessment & Plan:   Principal Problem:   NSTEMI (non-ST elevated myocardial infarction) Active Problems:   DM type 2 (diabetes mellitus, type 2)   Hypertension   Hypothyroidism   Acute clinical systolic heart failure   Acute blood loss anemia   Anemia  Acute hypoxic respiratory failure: Acute systolic heart failure in setting of NSTEMI: She is still requiring upto 4 lit of [place] Oxygen. She was initially started on lasix 40 mg bid, increased to 60 mg IV BID, monitor urine output.  ECHO showed LVEF of 40 to 45%, mildly decreased function, with regional wall motion abnormalities. Diastolic parameters are indeterminate. Severe MVR. Cardiology on board , Suspect ischemic cardiomyopathy, recommended cardiac cath once euvolemic.  Elevated troponins probably from demand ischemia from CHF and anemia.  Currently on aspirin and plavix and lipitor.  She is still unable to lie flat.  Continue diuresis.   Symptomatic anemia: Acute blood loss anemia from recent  bilateral metatarsal amputation: Hb 6.1 on Admission > S/p 1 unit of prbc transfusion.  Post transfusion hemoglobin is 7.7, improved to 8.3 and dropped back to 7.5. Stool for occult blood is negative on admission. She denies any melena or use of NSAIDS.  Anemia panel not significant.  Baseline hemoglobin  between 9.8 to 11 in 2020 and 10.1 in 01/2022.  GI consulted, recommended outpatient work up for now.  Transfuse to keep hemoglobin  greater than 7.      Insulin dependent DM : CBG (last 3)  Recent Labs (last 2 labs)       Recent Labs    08/20/22 2113 08/21/22 0836 08/21/22 1203  GLUCAP 137* 115* 164*       Continue SSI and semglee 12 units at bedtime.  No changes in meds.      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  Wound care as per Dr Lajoyce Corners.     Critical limb ischemia / PVD: 4/4- Left SFA and above knee popliteal artery mechanical thrombectomy, with angioplasty and stenting of left above knee popliteal artery and SFA   4/8- Right SFA and above knee popliteal artery laser atherectomy, with DCBA of the right above knee popliteal artery and SFA.  She was discharged on aspirin and plavix on 4/15 by vascular surgery.  Continue with aspirin and plavix and lipitor.    Hypothyroidism:  Continue synthroid.    Acute Kidney Injury: Looks like her baseline creatinine is less than 1,  She admitted with a creatinine of 1.41, improved to 1.17 today.  Continue to monitor. Avoid  nephrotoxic medications.   Estimated body mass index is 37.88 kg/m as calculated from the following:   Height as of this encounter:  (1.626 m).   Weight as of this encounter: 100.1 kg.   DVT prophylaxis:Lovenox Code Status: Full code Family Communication:No family at bed side. Disposition Plan:    Status is: Inpatient Remains inpatient appropriate because: Admitted  for systolic heart failure.  NSTEMI requiring left heart cath once  euvolemic.   Consultants:  Cardiology Gastroenterology  Procedures: None  Antimicrobials: Anti-infectives (From admission, onward)    None        Subjective: Patient was seen and examined at bedside.  Overnight events noted.   Patient reports doing better,  still unable to lie flat, she still requires 4 L of supplemental oxygen. She denies any chest pain.  She is anticipating left heart cath once feels better  Objective: Vitals:   08/21/22 2046 08/22/22 0424 08/22/22 0810 08/22/22 1125  BP: 103/63 107/66 114/66 97/62  Pulse: 81 82 82 68  Resp: (!) 22 (!) Temp: 98.3 F (36.8 C) 98 F (36.7 C) 98 F (36.7 C)   TempSrc: Oral Oral Oral   SpO2: 99% 97% 97% 96%  Weight:  99.1 kg    Height:        Intake/Output Summary (Last 24 hours) at 08/22/2022 1544 Last data filed at 08/22/2022 1100 Gross per 24 hour  Intake --  Output 2550 ml  Net -2550 ml   Filed Weights   08/20/22 0607 08/21/22 0326 08/22/22 0424  Weight: 101.3 kg 100.1 kg 99.1 kg    Examination:  General exam: Appears calm and comfortable, not in any acute distress, deconditioned. Respiratory system: Clear to auscultation. Respiratory effort normal. Cardiovascular system: S1 & S2 heard, regular rate and rhythm, no murmur. Gastrointestinal system: Abdomen is soft, non tender, non distended, BS+ Central nervous system: Alert and oriented x 3. No focal neurological deficits. Extremities: Bilateral transmetatarsal amputation, dressing noted Skin: No rashes, lesions or ulcers Psychiatry: Judgement and insight appear normal. Mood & affect appropriate.     Data Reviewed: I have personally reviewed following labs and imaging studies  CBC: Recent Labs  Lab 08/18/22 1344 08/19/22 0229 08/19/22 0915 08/20/22 0244 08/21/22 0321 08/22/22 0213  WBC 7.3 7.3  --  8.3 6.6 5.8  NEUTROABS 4.7  --   --   --   --   --   HGB 6.1* 7.5* 7.7* 8.3* 7.5* 7.5*  HCT 20.4* 24.1* 24.7* 25.4* 24.1* 24.6*  MCV  93.2 89.3  --  88.5 89.9 91.8  PLT 304 297  --  297 305 301   Basic Metabolic Panel: Recent Labs  Lab 08/18/22 1344 08/19/22 0228 08/20/22 0244 08/21/22 0321 08/22/22 0213  NA 137 136 138 136 137  K 3.9 3.8 4.9 4.7 4.7  CL 105 103 104 106 104  CO2 GLUCOSE 204* 147* 155* 145* 125*  BUN 38* 35* 35* 34* 28*  CREATININE 1.28* 1.41* 1.32* 1.29* 1.17*  CALCIUM 8.2* 8.2* 8.4* 8.6* 8.6*   GFR: Estimated Creatinine Clearance: 62.2 mL/min (A) (by C-G formula based on SCr of 1.17 mg/dL (H)). Liver Function Tests: No results for input(s): "AST", "ALT", "ALKPHOS", "BILITOT", "PROT", "ALBUMIN" in the last 168 hours. No results for input(s): "LIPASE", "AMYLASE" in the last 168 hours. No results for input(s): "AMMONIA" in the last 168 hours. Coagulation Profile: Recent Labs  Lab 08/18/22 1402  INR 1.2  Cardiac Enzymes: No results for input(s): "CKTOTAL", "CKMB", "CKMBINDEX", "TROPONINI" in the last 168 hours. BNP (last 3 results) No results for input(s): "PROBNP" in the last 8760 hours. HbA1C: No results for input(s): "HGBA1C" in the last 72 hours. CBG: Recent Labs  Lab 08/21/22 1203 08/21/22 1605 08/21/22 2137 08/22/22 0809 08/22/22 1122  GLUCAP 164* 136* 150* 100* 154*   Lipid Profile: No results for input(s): "CHOL", "HDL", "LDLCALC", "TRIG", "CHOLHDL", "LDLDIRECT" in the last 72 hours. Thyroid Function Tests: No results for input(s): "TSH", "T4TOTAL", "FREET4", "T3FREE", "THYROIDAB" in the last 72 hours. Anemia Panel: No results for input(s): "VITAMINB12", "FOLATE", "FERRITIN", "TIBC", "IRON", "RETICCTPCT" in the last 72 hours. Sepsis Labs: No results for input(s): "PROCALCITON", "LATICACIDVEN" in the last 168 hours.  No results found for this or any previous visit (from the past 240 hour(s)).   Radiology Studies: No results found.  Scheduled Meds:  aspirin EC  81 mg Oral Daily   atorvastatin  40 mg Oral Daily   clopidogrel  75 mg Oral Daily    enoxaparin (LOVENOX) injection  40 mg Subcutaneous Q24H   famotidine  20 mg Oral Daily   furosemide  60 mg Intravenous BID   gabapentin  300 mg Oral QHS   insulin aspart  0-15 Units Subcutaneous TID WC   insulin aspart  0-5 Units Subcutaneous QHS   insulin glargine-yfgn  12 Units Subcutaneous QHS   levothyroxine  150 mcg Oral Q0600   metoprolol succinate  12.5 mg Oral Daily   pantoprazole  40 mg Oral Daily   sodium chloride flush  3 mL Intravenous Q12H   Continuous Infusions:  sodium chloride     [START ON 08/23/2022] sodium chloride       LOS: 4 days    Time spent: 50 Mins    Willeen Niece, MD [facility]   If 7PM-7AM, please contact night-coverage

## 2022-08-22 NOTE — H&P (View-Only) (Signed)
Rounding Note    Patient Name: Kayla Schmitt Date of Encounter: 08/22/2022  [facility] Cardiologist: Jodelle Red, MD (new)   Subjective   Breathing is improving. She can't quite lay flat yet. No chest pain.   Inpatient Medications    Scheduled Meds:  aspirin EC  81 mg Oral Daily   atorvastatin  40 mg Oral Daily   clopidogrel  75 mg Oral Daily   enoxaparin (LOVENOX) injection  40 mg Subcutaneous Q24H   famotidine  20 mg Oral Daily   furosemide  60 mg Intravenous BID   gabapentin  300 mg Oral QHS   insulin aspart  0-15 Units Subcutaneous TID WC   insulin aspart  0-5 Units Subcutaneous QHS   insulin glargine-yfgn  12 Units Subcutaneous QHS   levothyroxine  150 mcg Oral Q0600   metoprolol succinate  12.5 mg Oral Daily   pantoprazole  40 mg Oral Daily   Continuous Infusions:  PRN Meds: acetaminophen, ondansetron **OR** ondansetron (ZOFRAN) IV, polyethylene glycol   Vital Signs    Vitals:   08/21/22 1800 08/21/22 2046 08/22/22 0424 08/22/22 0810  BP: 111/68 103/63 107/66 114/66  Pulse: 90 81 82 82  Resp: 19 (!) 22 (!) 23 20  Temp:  98.3 F (36.8 C) 98 F (36.7 C) 98 F (36.7 C)  TempSrc:  Oral Oral Oral  SpO2: 98% 99% 97% 97%  Weight:   99.1 kg   Height:        Intake/Output Summary (Last 24 hours) at 08/22/2022 0832 Last data filed at 08/22/2022 0008 Gross per 24 hour  Intake 180 ml  Output 2700 ml  Net -2520 ml      08/22/2022    4:24 AM 08/21/2022    3:26 AM 08/20/2022    6:07 AM  Last 3 Weights  Weight (lbs) 218 lb 7.6 oz 220 lb 10.9 oz 223 lb 5.2 oz  Weight (kg) 99.1 kg 100.1 kg 101.3 kg      Telemetry    NSR  - Personally Reviewed  ECG    No new tracings since 4/22 - Personally Reviewed  Physical Exam   GEN: No acute distress. Sitting upright on the side of the bed eating breakfast.   Neck: No JVD Cardiac: RRR, grade 3/6 systolic murmur. Radial pulses 2+ bilaterally   Respiratory: Crackles in bilateral lung  bases  GI: Soft, nontender, non-distended  MS: 2+ edema in BLE; No deformity. Neuro:  Nonfocal  Psych: Normal affect   Labs    High Sensitivity Troponin:   Recent Labs  Lab 08/18/22 1344 08/18/22 1607 08/19/22 0228  TROPONINIHS 1,096* 1,323* 1,227*     Chemistry Recent Labs  Lab 08/20/22 0244 08/21/22 0321 08/22/22 0213  NA 138 136 137  K 4.9 4.7 4.7  CL 104 106 104  CO2 GLUCOSE 155* 145* 125*  BUN 35* 34* 28*  CREATININE 1.32* 1.29* 1.17*  CALCIUM 8.4* 8.6* 8.6*  GFRNONAA 48* 49* 55*  ANIONGAP Lipids No results for input(s): "CHOL", "TRIG", "HDL", "LABVLDL", "LDLCALC", "CHOLHDL" in the last 168 hours.  Hematology Recent Labs  Lab 08/20/22 0244 08/21/22 0321 08/22/22 0213  WBC 8.3 6.6 5.8  RBC 2.87* 2.68* 2.68*  HGB 8.3* 7.5* 7.5*  HCT 25.4* 24.1* 24.6*  MCV 88.5 89.9 91.8  MCH 28.9 28.0 28.0  MCHC 32.7 31.1 30.5  RDW 17.5* 17.6* 17.5*  PLT 297 305 301   Thyroid No results  for input(s): "TSH", "FREET4" in the last 168 hours.  BNP Recent Labs  Lab 08/18/22 1344  BNP 486.0*    DDimer No results for input(s): "DDIMER" in the last 168 hours.   Radiology    No results found.  Cardiac Studies   Echocardiogram 08/19/22  1. Left ventricular ejection fraction, by estimation, is 40 to 45%. The  left ventricle has mildly decreased function. The left ventricle  demonstrates regional wall motion abnormalities (see scoring  diagram/findings for description). There is mild left  ventricular hypertrophy. Left ventricular diastolic parameters are  indeterminate.   2. Right ventricular systolic function is normal. The right ventricular  size is normal. Tricuspid regurgitation signal is inadequate for assessing  PA pressure.   3. Left atrial size was mildly dilated.   4. A small pericardial effusion is present.   5. The mitral valve is degenerative. Severe mitral valve regurgitation.  No evidence of mitral stenosis.   6. The aortic valve  was not well visualized. There is moderate  calcification of the aortic valve. There is moderate thickening of the  aortic valve. Aortic valve regurgitation is trivial. Mild aortic valve  stenosis. Aortic valve mean gradient measures  10.0 mmHg.   7. The inferior vena cava is dilated in size with <50% respiratory  variability, suggesting right atrial pressure of 15 mmHg.   Patient Profile     57 y.o. female with PMH of PAD (s/p multiple interventions and most recent bilateral TMA 08/08/22), HLD, type 2 DM, GERD, hypothyroidism, cardiology is following for CHF, NSTEMI.   Assessment & Plan    Acute Systolic Heart Failure Severe Mitral Valve Regurgitation  -Patient presented to [facility] ER on 4/20 complaining of dyspnea on exertion and orthopnea.  BNP was elevated to 486. hsTn [phone] -CTA chest on 4/20 showed no PE.  Did show pulmonary edema with small-moderate layering bilateral pleural effusions, probable compressive atelectasis versus consolidation of lung bases, mild enlarged mediastinal and bilateral hilar lymph nodes, trace pericardial effusion, aortic and coronary artery atherosclerosis. -Echocardiogram this admission showed EF 40-45% with regional wall motion abnormalities, mild LVH, normal RV systolic function, small pericardial effusion, severe mitral valve regurgitation, mild aortic valve stenosis - Echocardiogram from 2021 showed normal mitral valve function. Severe mitral valve regurgitation is newly discovered  - Patient has been diuresing on IV lasix- output 2.7 L urine yesterday. Weight down from 223.22 lbs to 218.48 lbs. Renal function stable. Continues to have crackles in lungs, ankle edema, orthopnea   - Continue IV lasix 60 mg BID - Suspect ischemic cardiomyopathy. Plan for cardiac catheterization once euvolemic. May be able to do tomorrow  - BP somewhat low, which limits GDMT. Continue metoprolol succinate 12.5 mg. Plan to add SGLT2i after cath   NSTEMI  -  [id number] - Given patient's extensive history of PAD, suspect patient also has underlying CAD. With wall motion abnormalities on Echo, concerned for ischemic cardiomyopathy  - Plan for cardiac catheterization once patient euvolemic  - She cannot quite lay flat, but is diuresing well. May be able to cath tomorrow.  - Continue metoprolol, ASA, plavix, lipitor    HTN  - Home amlodipine held to allow titration of GDMT - Continue metoprolol succinate 12.5 mg daily   HLD  - Lipid panel 4/5 showed Ldl 54 - Continue lipitor 40 mg daily  For questions or updates, please contact [facility] Please consult [email or web address] for contact info under    Signed, Jonita Albee, PA-C  08/22/2022, 8:32  AM   Patient seen and examined, note reviewed with the signed Advanced Practice Provider. I personally reviewed laboratory data, imaging studies and relevant notes. I independently examined the patient and formulated the important aspects of the plan. I have personally discussed the plan with the patient and/or family. Comments or changes to the note/plan are indicated below.  Clinically improving but still short of breath.  Not completely able to lie flat.  Hopefully she will be ready for catheterization tomorrow.  Will get her on the Schedule. All of her current status has been answered. Continue current regimen.  Continue current Lasix dose.  Thomasene Ripple DO, MS [facility] Attending Cardiologist [facility]  [address] [place], [place] [address] [phone] Website: [email or web address]

## 2022-08-22 NOTE — Telephone Encounter (Signed)
Called pt and lm on vm to advised of below and to call with any other questions or concerns.

## 2022-08-23 ENCOUNTER — Encounter (HOSPITAL_COMMUNITY): Payer: Self-pay | Admitting: Interventional Cardiology

## 2022-08-23 ENCOUNTER — Encounter (HOSPITAL_COMMUNITY)
Admission: EM | Disposition: A | Discharge status: Home Health | Payer: Self-pay | Source: Home / Self Care | Attending: Family Medicine

## 2022-08-23 ENCOUNTER — Encounter: Payer: Managed Care, Other (non HMO) | Admitting: Orthopedic Surgery

## 2022-08-23 DIAGNOSIS — D62 Acute posthemorrhagic anemia: Secondary | ICD-10-CM | POA: Diagnosis not present

## 2022-08-23 DIAGNOSIS — I2510 Atherosclerotic heart disease of native coronary artery without angina pectoris: Secondary | ICD-10-CM | POA: Diagnosis not present

## 2022-08-23 DIAGNOSIS — I340 Nonrheumatic mitral (valve) insufficiency: Secondary | ICD-10-CM | POA: Diagnosis not present

## 2022-08-23 DIAGNOSIS — I214 Non-ST elevation (NSTEMI) myocardial infarction: Secondary | ICD-10-CM | POA: Diagnosis not present

## 2022-08-23 HISTORY — PX: RIGHT/LEFT HEART CATH AND CORONARY ANGIOGRAPHY: CATH118266

## 2022-08-23 LAB — PHOSPHORUS: Phosphorus: 4.7 mg/dL — ABNORMAL HIGH (ref 2.5–4.6)

## 2022-08-23 LAB — POCT I-STAT EG7
Acid-Base Excess: 5 mmol/L — ABNORMAL HIGH (ref 0.0–2.0)
Acid-Base Excess: 5 mmol/L — ABNORMAL HIGH (ref 0.0–2.0)
Bicarbonate: 29.5 mmol/L — ABNORMAL HIGH (ref 20.0–28.0)
Bicarbonate: 29.6 mmol/L — ABNORMAL HIGH (ref 20.0–28.0)
Calcium, Ion: 1.2 mmol/L (ref 1.15–1.40)
Calcium, Ion: 1.21 mmol/L (ref 1.15–1.40)
HCT: 23 % — ABNORMAL LOW (ref 36.0–46.0)
HCT: 24 % — ABNORMAL LOW (ref 36.0–46.0)
Hemoglobin: 7.8 g/dL — ABNORMAL LOW (ref 12.0–15.0)
Hemoglobin: 8.2 g/dL — ABNORMAL LOW (ref 12.0–15.0)
O2 Saturation: 59 %
O2 Saturation: 60 %
Potassium: 4.4 mmol/L (ref 3.5–5.1)
Potassium: 4.4 mmol/L (ref 3.5–5.1)
Sodium: 138 mmol/L (ref 135–145)
Sodium: 138 mmol/L (ref 135–145)
TCO2: 31 mmol/L (ref 22–32)
TCO2: 31 mmol/L (ref 22–32)
pCO2, Ven: 43.9 mmHg — ABNORMAL LOW (ref 44–60)
pCO2, Ven: 44.1 mmHg (ref 44–60)
pH, Ven: 7.435 — ABNORMAL HIGH (ref 7.25–7.43)
pH, Ven: 7.435 — ABNORMAL HIGH (ref 7.25–7.43)
pO2, Ven: 30 mmHg — CL (ref 32–45)
pO2, Ven: 31 mmHg — CL (ref 32–45)

## 2022-08-23 LAB — BASIC METABOLIC PANEL
Anion gap: 12 (ref 5–15)
BUN: 26 mg/dL — ABNORMAL HIGH (ref 6–20)
CO2: 27 mmol/L (ref 22–32)
Calcium: 8.7 mg/dL — ABNORMAL LOW (ref 8.9–10.3)
Chloride: 101 mmol/L (ref 98–111)
Creatinine, Ser: 1.21 mg/dL — ABNORMAL HIGH (ref 0.44–1.00)
GFR, Estimated: 53 mL/min — ABNORMAL LOW (ref >60)
Glucose, Bld: 144 mg/dL — ABNORMAL HIGH (ref 70–99)
Potassium: 4.6 mmol/L (ref 3.5–5.1)
Sodium: 140 mmol/L (ref 135–145)

## 2022-08-23 LAB — POCT I-STAT 7, (LYTES, BLD GAS, ICA,H+H)
Acid-Base Excess: 4 mmol/L — ABNORMAL HIGH (ref 0.0–2.0)
Bicarbonate: 28 mmol/L (ref 20.0–28.0)
Calcium, Ion: 1.21 mmol/L (ref 1.15–1.40)
HCT: 23 % — ABNORMAL LOW (ref 36.0–46.0)
Hemoglobin: 7.8 g/dL — ABNORMAL LOW (ref 12.0–15.0)
O2 Saturation: 91 %
Potassium: 4.4 mmol/L (ref 3.5–5.1)
Sodium: 138 mmol/L (ref 135–145)
TCO2: 29 mmol/L (ref 22–32)
pCO2 arterial: 39.1 mmHg (ref 32–48)
pH, Arterial: 7.462 — ABNORMAL HIGH (ref 7.35–7.45)
pO2, Arterial: 59 mmHg — ABNORMAL LOW (ref 83–108)

## 2022-08-23 LAB — MAGNESIUM: Magnesium: 1.7 mg/dL (ref 1.7–2.4)

## 2022-08-23 LAB — GLUCOSE, CAPILLARY
Glucose-Capillary: 129 mg/dL — ABNORMAL HIGH (ref 70–99)
Glucose-Capillary: 132 mg/dL — ABNORMAL HIGH (ref 70–99)
Glucose-Capillary: 156 mg/dL — ABNORMAL HIGH (ref 70–99)
Glucose-Capillary: 140 mg/dL — ABNORMAL HIGH (ref 70–99)

## 2022-08-23 LAB — CBC
HCT: 23.2 % — ABNORMAL LOW (ref 36.0–46.0)
Hemoglobin: 7.4 g/dL — ABNORMAL LOW (ref 12.0–15.0)
MCH: 28.4 pg (ref 26.0–34.0)
MCHC: 31.9 g/dL (ref 30.0–36.0)
MCV: 88.9 fL (ref 80.0–100.0)
Platelets: 291 10*3/uL (ref 150–400)
RBC: 2.61 MIL/uL — ABNORMAL LOW (ref 3.87–5.11)
RDW: 17.4 % — ABNORMAL HIGH (ref 11.5–15.5)
WBC: 5.1 10*3/uL (ref 4.0–10.5)
nRBC: 0 % (ref 0.0–0.2)

## 2022-08-23 SURGERY — RIGHT/LEFT HEART CATH AND CORONARY ANGIOGRAPHY
Anesthesia: LOCAL

## 2022-08-23 MED ORDER — ACETAMINOPHEN 325 MG PO TABS: 650.0000 mg | ORAL_TABLET | ORAL | Status: DC | PRN

## 2022-08-23 MED ORDER — SODIUM CHLORIDE 0.9% FLUSH
3.0000 mL | Freq: Two times a day (BID) | INTRAVENOUS | Status: DC
  Administered 2022-08-23 – 2022-08-27 (×9): 3 mL via INTRAVENOUS

## 2022-08-23 MED ORDER — HEPARIN SODIUM (PORCINE) 1000 UNIT/ML IJ SOLN
INTRAMUSCULAR | Status: AC
  Filled 2022-08-23: qty 10

## 2022-08-23 MED ORDER — LABETALOL HCL 5 MG/ML IV SOLN: 10.0000 mg | INTRAVENOUS | Status: AC | PRN

## 2022-08-23 MED ORDER — IOHEXOL 350 MG/ML SOLN
INTRAVENOUS | Status: DC | PRN
  Administered 2022-08-23: 70 mL

## 2022-08-23 MED ORDER — MIDAZOLAM HCL 2 MG/2ML IJ SOLN
INTRAMUSCULAR | Status: AC
  Filled 2022-08-23: qty 2

## 2022-08-23 MED ORDER — LIDOCAINE HCL (PF) 1 % IJ SOLN
INTRAMUSCULAR | Status: AC
  Filled 2022-08-23: qty 30

## 2022-08-23 MED ORDER — FENTANYL CITRATE (PF) 100 MCG/2ML IJ SOLN
INTRAMUSCULAR | Status: AC
  Filled 2022-08-23: qty 2

## 2022-08-23 MED ORDER — ORAL CARE MOUTH RINSE: 15.0000 mL | OROMUCOSAL | Status: DC | PRN

## 2022-08-23 MED ORDER — HYDRALAZINE HCL 20 MG/ML IJ SOLN: 10.0000 mg | INTRAMUSCULAR | Status: AC | PRN

## 2022-08-23 MED ORDER — HEPARIN (PORCINE) IN NACL 1000-0.9 UT/500ML-% IV SOLN
INTRAVENOUS | Status: DC | PRN
  Administered 2022-08-23 (×2): 500 mL

## 2022-08-23 MED ORDER — VERAPAMIL HCL 2.5 MG/ML IV SOLN
INTRAVENOUS | Status: DC | PRN
  Administered 2022-08-23: 10 mL via INTRA_ARTERIAL

## 2022-08-23 MED ORDER — SODIUM CHLORIDE 0.9 % IV SOLN: 250.0000 mL | INTRAVENOUS | Status: DC | PRN

## 2022-08-23 MED ORDER — SODIUM CHLORIDE 0.9% FLUSH: 3.0000 mL | INTRAVENOUS | Status: DC | PRN

## 2022-08-23 MED ORDER — FENTANYL CITRATE (PF) 100 MCG/2ML IJ SOLN
INTRAMUSCULAR | Status: DC | PRN
  Administered 2022-08-23: 25 ug via INTRAVENOUS

## 2022-08-23 MED ORDER — ONDANSETRON HCL 4 MG/2ML IJ SOLN: 4.0000 mg | Freq: Four times a day (QID) | INTRAMUSCULAR | Status: DC | PRN

## 2022-08-23 MED ORDER — MIDAZOLAM HCL 2 MG/2ML IJ SOLN
INTRAMUSCULAR | Status: DC | PRN
  Administered 2022-08-23: 2 mg via INTRAVENOUS

## 2022-08-23 MED ORDER — LIDOCAINE HCL (PF) 1 % IJ SOLN
INTRAMUSCULAR | Status: DC | PRN
  Administered 2022-08-23 (×2): 2 mL

## 2022-08-23 MED ORDER — HEPARIN SODIUM (PORCINE) 1000 UNIT/ML IJ SOLN
INTRAMUSCULAR | Status: DC | PRN
  Administered 2022-08-23: 4000 [IU] via INTRAVENOUS

## 2022-08-23 MED ORDER — VERAPAMIL HCL 2.5 MG/ML IV SOLN
INTRAVENOUS | Status: AC
  Filled 2022-08-23: qty 2

## 2022-08-23 SURGICAL SUPPLY — 11 items
CATH 5FR JL3.5 JR4 ANG PIG MP (CATHETERS) IMPLANT
CATH BALLN WEDGE 5F 110CM (CATHETERS) IMPLANT
DEVICE RAD COMP TR BAND LRG (VASCULAR PRODUCTS) IMPLANT
GLIDESHEATH SLEND SS 6F .021 (SHEATH) IMPLANT
GUIDEWIRE INQWIRE 1.5J.035X260 (WIRE) IMPLANT
INQWIRE 1.5J .035X260CM (WIRE) ×1
KIT HEART LEFT (KITS) ×2 IMPLANT
PACK CARDIAC CATHETERIZATION (CUSTOM PROCEDURE TRAY) ×2 IMPLANT
SHEATH GLIDE SLENDER 4/5FR (SHEATH) IMPLANT
TRANSDUCER W/STOPCOCK (MISCELLANEOUS) ×2 IMPLANT
TUBING CIL FLEX 10 FLL-RA (TUBING) ×2 IMPLANT

## 2022-08-23 NOTE — Interval H&P Note (Signed)
Cath Lab Visit (complete for each Cath Lab visit)  Clinical Evaluation Leading to the Procedure:   ACS: Yes.    Non-ACS:    Anginal Classification: CCS IV  Anti-ischemic medical therapy: Minimal Therapy (1 class of medications)  Non-Invasive Test Results: No non-invasive testing performed  Prior CABG: No previous CABG   Severe mitral regurgitation   History and Physical Interval Note:  08/23/2022 9:02 AM  Kayla Schmitt  has presented today for surgery, with the diagnosis of ischemic cardiomyopathy.  The various methods of treatment have been discussed with the patient and family. After consideration of risks, benefits and other options for treatment, the patient has consented to  Procedure(s): RIGHT/LEFT HEART CATH AND CORONARY ANGIOGRAPHY (N/A) as a surgical intervention.  The patient's history has been reviewed, patient examined, no change in status, stable for surgery.  I have reviewed the patient's chart and labs.  Questions were answered to the patient's satisfaction.     Lance Muss

## 2022-08-23 NOTE — Consult Note (Addendum)
[address]       [place] [address]             [phone]        Kayla Schmitt Medical Record #[id number] Date of Birth: 06-Jan-1966  Referring: Isabel Caprice Primary Care: Medicine, Jonita Albee Internal Primary Cardiologist:Bridgette Cristal Deer, MD  Chief Complaint:    Chief Complaint  Patient presents with   Shortness of Breath    History of Present Illness:      Kayla Schmitt is a 57 yo obese female with history of HTN, Hypothyroidism, HLD, and severe PAD S/P recent bilateral transmetatarsal amputation by Dr. Lajoyce Corners on 08/22/2022.  She has had previous vascular interventions as well.  She presented to the ED at [facility] on 08/18/2022 with complaints of intermittent shortness of breath.  She also states she is unable to lay flat due to difficulty breathing.  She also mentioned that she had hit the foot she just had operated on and experienced some bleeding.  Workup in the ED consisted of EKG which showed evidence of ischemia with initial Troponin level being elevated.  CT of the chest showed no evidence of pulmonary embolism, but did show bilateral pleural effusions.  She was ruled in for NSTEMI with acute systolic heart failure and was admitted to the hospital for further care.  Cardiology consult was obtained and they treated patient with IV diuretics.  Echocardiogram was obtained and showed reduced EF of 40-45% with antero-lateral wall and apical lateral hypokinesis there was also severe MR.  Gastroenterology consult was obtained for acute on chronic anemia.  They did not feel there was concern for acute bleeding and did not recommend EGD at this time.  She remained hospitalized at [facility] for aggressive IV diuresis.  She was transferred to [facility] for further care and cardiac catheterization which was performed and showed CAD.  It was felt she would require coronary bypass grafting with intervention on her Mitral Valve.   Cardiothoracic surgery  consultation has been requested. She is on insulin for diabetes.  She states her sugars have been pretty well controlled.  She is a non-smoker.  Patient states she was active prior to surgery, working out 4 days a week.  She is currently non- weight bearing.  She has recently loss 70 lbs.  The patient states her father had heart disease and passed away at 36.  She has dentures on the top.  Her bottom teeth are her own, she hasn't seen a dentist for those in about a year.  The patient denied fatigue, weakness, or change in activity level prior to her surgery.    Current Activity/ Functional Status: Patient is independent with mobility/ambulation, transfers, ADL's, IADL's.    Past Medical History:  Diagnosis Date   Arthritis    Critical limb ischemia of both lower extremities    Diabetes mellitus without complication    type 2   GERD (gastroesophageal reflux disease)    Hypercholesteremia    Hypertension    Hypothyroidism    PAD (peripheral artery disease)    PONV (postoperative nausea and vomiting)    Thyroid disease     Past Surgical History:  Procedure Laterality Date   ABDOMINAL AORTOGRAM W/LOWER EXTREMITY Bilateral 01/14/2020   Procedure: ABDOMINAL AORTOGRAM W/LOWER EXTREMITY;  Surgeon: Cephus Shelling, MD;  Location: [facility] INVASIVE CV LAB;  Service: Cardiovascular;  Laterality: Bilateral;   ABDOMINAL AORTOGRAM W/LOWER EXTREMITY N/A 07/05/2022   Procedure: ABDOMINAL AORTOGRAM  W/LOWER EXTREMITY;  Surgeon: Cephus Shelling, MD;  Location: [facility] INVASIVE CV LAB;  Service: Cardiovascular;  Laterality: N/A;   ABDOMINAL AORTOGRAM W/LOWER EXTREMITY N/A 08/02/2022   Procedure: ABDOMINAL AORTOGRAM W/LOWER EXTREMITY;  Surgeon: Cephus Shelling, MD;  Location: [facility] INVASIVE CV LAB;  Service: Cardiovascular;  Laterality: N/A;   ABDOMINAL AORTOGRAM W/LOWER EXTREMITY N/A 08/06/2022   Procedure: ABDOMINAL AORTOGRAM W/LOWER EXTREMITY;  Surgeon: Cephus Shelling, MD;  Location: [facility] INVASIVE CV LAB;   Service: Cardiovascular;  Laterality: N/A;   AMPUTATION Bilateral 07/20/2022   Procedure: AMPUTATION RIGHT GREAT TOE AND SECOND TOE, AMPUTATION LEFT GREAT TOE;  Surgeon: Nadara Mustard, MD;  Location: [facility] OR;  Service: Orthopedics;  Laterality: Bilateral;   AMPUTATION Bilateral 08/08/2022   Procedure: BILATERAL TRANSMETATARSAL AMPUTATION;  Surgeon: Nadara Mustard, MD;  Location: [facility] OR;  Service: Orthopedics;  Laterality: Bilateral;   AMPUTATION TOE     PERIPHERAL VASCULAR ATHERECTOMY Right 01/14/2020   Procedure: PERIPHERAL VASCULAR ATHERECTOMY;  Surgeon: Cephus Shelling, MD;  Location: [facility] INVASIVE CV LAB;  Service: Cardiovascular;  Laterality: Right;  sfa   PERIPHERAL VASCULAR INTERVENTION Right 01/14/2020   Procedure: PERIPHERAL VASCULAR INTERVENTION;  Surgeon: Cephus Shelling, MD;  Location: [facility] INVASIVE CV LAB;  Service: Cardiovascular;  Laterality: Right;  sfa stent    PERIPHERAL VASCULAR INTERVENTION  07/05/2022   Procedure: PERIPHERAL VASCULAR INTERVENTION;  Surgeon: Cephus Shelling, MD;  Location: [facility] INVASIVE CV LAB;  Service: Cardiovascular;;   PERIPHERAL VASCULAR INTERVENTION  08/02/2022   Procedure: PERIPHERAL VASCULAR INTERVENTION;  Surgeon: Cephus Shelling, MD;  Location: [facility] INVASIVE CV LAB;  Service: Cardiovascular;;   PERIPHERAL VASCULAR INTERVENTION  08/06/2022   Procedure: PERIPHERAL VASCULAR INTERVENTION;  Surgeon: Cephus Shelling, MD;  Location: [facility] INVASIVE CV LAB;  Service: Cardiovascular;;   PERIPHERAL VASCULAR THROMBECTOMY  08/02/2022   Procedure: PERIPHERAL VASCULAR THROMBECTOMY;  Surgeon: Cephus Shelling, MD;  Location: [facility] INVASIVE CV LAB;  Service: Cardiovascular;;   RIGHT/LEFT HEART CATH AND CORONARY ANGIOGRAPHY N/A 08/23/2022   Procedure: RIGHT/LEFT HEART CATH AND CORONARY ANGIOGRAPHY;  Surgeon: [name], MD;  Location: [facility] INVASIVE CV LAB;  Service: Cardiovascular;  Laterality: N/A;   SKIN SPLIT GRAFT Right 03/18/2020   Procedure: SKIN  GRAFTING RIGHT FOOT ULCER;  Surgeon: Nadara Mustard, MD;  Location: [facility] OR;  Service: Orthopedics;  Laterality: Right;   WISDOM TOOTH EXTRACTION      Social History   Tobacco Use  Smoking Status Former   Types: Cigarettes   Quit date: 01/2017   Years since quitting: 5.5  Smokeless Tobacco Never    Social History   Substance and Sexual Activity  Alcohol Use Not Currently     Allergies  Allergen Reactions   Doxycycline Nausea And Vomiting    Extremely N/V   Trental [Pentoxifylline] Nausea And Vomiting   Penicillins Rash    Tolerated cephalosporins 07/2022     Current Facility-Administered Medications  Medication Dose Route Frequency Provider Last Rate Last Admin   0.9 %  sodium chloride infusion  250 mL Intravenous PRN [name], MD       acetaminophen (TYLENOL) tablet 650 mg  650 mg Oral Q4H PRN [name], MD       aspirin EC tablet 81 mg  81 mg Oral Daily [name], MD   81 mg at 08/23/22 0615   atorvastatin (LIPITOR) tablet 40 mg  40 mg Oral Daily [name], MD   40 mg at 08/23/22 [phone]  clopidogrel (PLAVIX) tablet 75 mg  75 mg Oral Daily [name], MD   75 mg at 08/23/22 0758   enoxaparin (LOVENOX) injection 40 mg  40 mg Subcutaneous Q24H [name], MD   40 mg at 08/22/22 0829   famotidine (PEPCID) tablet 20 mg  20 mg Oral Daily [name], MD   20 mg at 08/23/22 0959   furosemide (LASIX) injection 60 mg  60 mg Intravenous BID [name], MD   60 mg at 08/23/22 4540   gabapentin (NEURONTIN) capsule 300 mg  300 mg Oral QHS [name], MD   300 mg at 08/22/22 2154   hydrALAZINE (APRESOLINE) injection 10 mg  10 mg Intravenous Q20 Min PRN [name], MD       insulin aspart (novoLOG) injection 0-15 Units  0-15 Units Subcutaneous TID WC [name], MD   2 Units at 08/22/22 1703   insulin aspart (novoLOG) injection 0-5 Units  0-5 Units Subcutaneous QHS [name], MD       insulin glargine-yfgn [facility]) injection 12 Units  12 Units Subcutaneous QHS [name], MD   12 Units at 08/22/22 2154   labetalol (NORMODYNE) injection 10 mg  10 mg Intravenous Q10 min PRN [name], MD       levothyroxine (SYNTHROID) tablet 150 mcg  150 mcg Oral Q0600 [name], MD   150 mcg at 08/23/22 0615   metoprolol succinate (TOPROL-XL) 24 hr tablet 12.5 mg  12.5 mg Oral Daily [name], MD   12.5 mg at 08/23/22 0959   ondansetron (ZOFRAN) injection 4 mg  4 mg Intravenous Q6H PRN [name], MD       ondansetron [facility]) tablet 4 mg  4 mg Oral Q6H PRN [name], MD       Oral care mouth rinse  15 mL Mouth Rinse PRN [name], MD       pantoprazole (PROTONIX) EC tablet 40 mg  40 mg Oral Daily [name], MD   40 mg at 08/23/22 0959   polyethylene glycol (MIRALAX / GLYCOLAX) packet 17 g  17 g Oral Daily PRN [name], MD       sodium chloride flush (NS) 0.9 % injection 3 mL  3 mL Intravenous Q12H [name], MD   3 mL at 08/23/22 1000   sodium chloride flush (NS) 0.9 % injection 3 mL  3 mL Intravenous Q12H [name], MD   3 mL at 08/23/22 1000   sodium chloride flush (NS) 0.9 % injection 3 mL  3 mL Intravenous PRN [name], MD        Medications Prior to Admission  Medication Sig Dispense Refill Last Dose   acetaminophen (TYLENOL) 500 MG tablet Take 500-1,000 mg by mouth every 6 (six) hours as needed for moderate pain.   unknown   amLODipine (NORVASC) 10 MG tablet Take 10 mg by mouth daily.   08/18/2022   aspirin 81 MG EC tablet Take 1 tablet by mouth daily.   08/18/2022   atorvastatin (LIPITOR) 40 MG tablet Take 40 mg by mouth daily.   08/18/2022   clopidogrel (PLAVIX) 75 MG tablet TAKE 1 TABLET BY MOUTH EVERY DAY WITH BREAKFAST (Patient taking differently: Take 75 mg by mouth daily.) 90 tablet 3 08/18/2022 at 0930   diphenhydrAMINE HCl,  Sleep, (ZZZQUIL) 25 MG CAPS Take 25 mg by mouth at bedtime as  needed (Sleep).   08/17/2022   famotidine (PEPCID) 20 MG tablet Take 20 mg by mouth daily.   08/18/2022   gabapentin (NEURONTIN) 300 MG capsule Take 300 mg by mouth at bedtime.   08/17/2022   ibuprofen (ADVIL) 600 MG tablet Take 1 tablet (600 mg total) by mouth every 6 (six) hours as needed. 30 tablet 0 unknown   insulin degludec (TRESIBA FLEXTOUCH) 100 UNIT/ML FlexTouch Pen Inject 10 Units into the skin at bedtime. (Patient taking differently: Inject 12-18 Units into the skin at bedtime.) 3 mL 3 08/16/2022   levothyroxine (SYNTHROID) 150 MCG tablet Take 150 mcg by mouth daily.   08/18/2022   pantoprazole (PROTONIX) 40 MG tablet Take 40 mg by mouth daily.   08/18/2022   polyethylene glycol (MIRALAX / GLYCOLAX) 17 g packet Take 17 g by mouth daily.   08/18/2022   [EXPIRED] zinc sulfate 220 (50 Zn) MG capsule Take 1 capsule (220 mg total) by mouth daily for 9 days.   08/18/2022   nitroGLYCERIN (NITRODUR - DOSED IN MG/24 HR) 0.2 mg/hr patch Place 1 patch (0.2 mg total) onto the skin daily. (Patient not taking: Reported on 08/18/2022) 30 patch 12 Not Taking   pentoxifylline (TRENTAL) 400 MG CR tablet Take 1 tablet (400 mg total) by mouth 3 (three) times daily with meals. (Patient not taking: Reported on 08/18/2022) 90 tablet 3 Not Taking   Semaglutide, 1 MG/DOSE, (OZEMPIC, 1 MG/DOSE,) 2 MG/1.5ML SOPN Inject 1 mg into the skin every Saturday.       Family History  Problem Relation Age of Onset   Diabetes Other    CAD Other    Review of Systems:   ROS    Cardiac Review of Systems: Y or  [    ]= no  Chest Pain [  N  ]  Resting SOB [   ] Exertional SOB  [ N ]  Orthopnea Klaus.Mock  ]   Pedal Edema [ N  ]    Palpitations [ N ] Syncope  [ N ]   Presyncope [   ]  General Review of Systems: [Y] = yes [  ]=no Constitional: recent weight change [ y, lost 70 lbs ]; anorexia [  ]; fatigue [ N ]; nausea [  ]; night sweats [  ]; fever [  ]; or chills [  ]                                                                Dental: Last Dentist visit: dentures on top, approximately 1 year ago on bottom   Eye : blurred vision [ N ]; diplopia [   ]; vision changes Klaus.Mock  ];  Amaurosis fugax[  ]; Resp: cough [  ];  wheezing[  ];  hemoptysis[  ]; shortness of breath[  ]; paroxysmal nocturnal dyspnea[  Y]; dyspnea on exertionY[  ]; or orthopnea[  ];  GI:  gallstones[  ], vomiting[N  ];  dysphagia[  ]; melena[  ];  hematochezia [  ]; heartburn[  ];   Hx of  Colonoscopy[  ]; GU: kidney stones [  ]; hematuria[  ];   dysuria [  ];  nocturia[  ];  history of     obstruction [  ]; urinary  frequency [  ]             Skin: rash, swelling[ N ];, hair loss[  ];  peripheral edema[Y  ];  or itching[  ]; Musculosketetal: myalgias[  ];  joint swelling[  ];  joint erythema[  ];  joint pain[  ];  back pain[  ];  Heme/Lymph: bruising[  ];  bleeding[  ];  anemia[  ];  Neuro: TIA[ N ];  headaches[  ];  stroke[N  ];  vertigo[  ];  seizures[  ];   paresthesias[  ];  difficulty walking[ y, recent bilateral transmetatarsal amputations ];  Psych:depression[  ]; anxiety[  ];  Endocrine: diabetes[ Y ];  thyroid dysfunction[Y  ];  Physical Exam: BP 116/71   Pulse (!) 0   Temp 98 F (36.7 C) (Oral)   Resp (!) 22   Ht 5\' 4"  (1.626 m)   Wt 97.2 kg   SpO2 (!) 89%   BMI 36.78 kg/m   General appearance: alert, cooperative, and no distress Head: Normocephalic, without obvious abnormality, atraumatic Neck: carotid bruit on right Resp: diminished breath sounds bibasilar Cardio: regular rate and rhythm and + systolic murmur GI: soft, non-tender; bowel sounds normal; no masses,  no organomegaly Extremities: + pitting edema, bilateral transmet amputations, boot in place Neurologic: Grossly normal  Diagnostic Studies & Laboratory data:     Recent Radiology Findings:   CARDIAC CATHETERIZATION  Result Date: 08/23/2022   Ost LAD to Prox LAD lesion is 75% stenosed.   Ost Cx to Prox Cx lesion is  100% stenosed.   1st Diag lesion is 25% stenosed.   Mid RCA lesion is 70% stenosed.   Mid LM to Dist LM lesion is 40% stenosed.   Mid LAD lesion is 70% stenosed.   LV end diastolic pressure is moderately elevated.   Hemodynamic findings consistent with mild pulmonary hypertension.   There is no aortic valve stenosis.   Aortic saturation 91%, PA saturation 60%, mean RA pressure 9 mmHg, PA pressure 42/24, mean PA pressure 32 mmHg, mean pulmonary capillary wedge pressure 24 mmHg, V waves noted to 45 mmHg.  Cardiac output 8.58 L/min, cardiac index 4.26. Severe, calcific three-vessel coronary artery disease.  Severe mitral regurgitation by noninvasive workup.  Significant V waves noted on the wedge tracing.  Will obtain cardiac surgery consult for CABG and mitral valve repair.     I have independently reviewed the above radiologic studies and discussed with the patient   Recent Lab Findings: Lab Results  Component Value Date   WBC 5.1 08/23/2022   HGB 7.4 (L) 08/23/2022   HCT 23.2 (L) 08/23/2022   PLT 291 08/23/2022   GLUCOSE 144 (H) 08/23/2022   CHOL 92 08/03/2022   TRIG 127 08/03/2022   HDL 13 (L) 08/03/2022   LDLCALC 54 08/03/2022   ALT 14 08/06/2022   AST 17 08/06/2022   NA 140 08/23/2022   K 4.6 08/23/2022   CL 101 08/23/2022   CREATININE 1.21 (H) 08/23/2022   BUN 26 (H) 08/23/2022   CO2 27 08/23/2022   INR 1.2 08/18/2022   HGBA1C 6.4 (H) 08/02/2022    Assessment / Plan:      CAD- ruled in for NSTEMI- cath with severe 3V CAD Severe Mitral Regurgitation Acute Systolic Heart Failure- receiving diuresis Severe PAD, multiple procedures.  Recent bilateral transmetatarsal amputation by Dr. Lajoyce Corners on 4/10.Marland Kitchen currently in boot, non-weight bearing DM- previously poorly controlled, recent A1c is 6.4, recently lost 70 lbs Chronic  Anemia HLD HTN Poor Dentition- upper dentures, lower need dental evaluation   Dispo- patient admitted with Acute HF, 3V CAD... currently being aggressive IV  diuresis... recent bilateral transmetatarsal amputation.. currently non-weight bearing.... I suspect patient will be surgical candidate.  Her surgical risk will be increased.   May be best to get her medicine optimized and bring her back as an outpatient for surgery to give her more time to recover from recent surgical procedures... Dr. Leafy Ro will evaluate and follow up with recommendations  I  spent 60 minutes counseling the patient face to face.   Lowella Dandy, PA-C 08/23/2022 10:38 AM  Agree with above note  Pt presents with acute CHF (systolic)  following bilateral transmetatarsal amputations. She also had anemia not needing work up per GI. Pt has been found to have depressed LV function to an EF 40-45% and severe MR (appears type IIIB). Pt with significant PVD and has moderate calcification of ascending aorta that is not prohibitive of surgery but increases risk. She also has a nongraftable OM. Pt currently on plavix. Pt and mother were informed that her best long term outlook would be with CABG and due to her ischemic basis for her MR the concern for need to replace may be what is needed. She has moderate MAC.  Options would be to aggressively manage her CHF and allow her to recover from her amputations and perform TEE as out pt to assess her MV (may improve with lowering her LVEDP) and perform CABG in 4 - 6 weeks. Could wait for plavix to wash out and proceed with CABG/MV surgery end of next week and have TEE as inpatient while we wait. Will follow along as to plan from Cardiology is formed

## 2022-08-23 NOTE — Progress Notes (Signed)
.  Heart Failure Navigator Progress Note  Following this hospitalization to assess for HV TOC readiness.   EF 40-45% Cath 4/25: TCTS Evaluation for CABG Plan to Follow for HF TOC after CABG.   Rhae Hammock, BSN, Scientist, clinical (histocompatibility and immunogenetics) Only

## 2022-08-23 NOTE — Progress Notes (Addendum)
Rounding Note    Patient Name: Keerat Denicola Date of Encounter: 08/23/2022  [facility] Cardiologist: Jodelle Red, MD  Subjective   Breathing continues to improve. Patient can now lay flat without discomfort. No chest pain   Inpatient Medications    Scheduled Meds:  aspirin EC  81 mg Oral Daily   atorvastatin  40 mg Oral Daily   clopidogrel  75 mg Oral Daily   enoxaparin (LOVENOX) injection  40 mg Subcutaneous Q24H   famotidine  20 mg Oral Daily   furosemide  60 mg Intravenous BID   gabapentin  300 mg Oral QHS   insulin aspart  0-15 Units Subcutaneous TID WC   insulin aspart  0-5 Units Subcutaneous QHS   insulin glargine-yfgn  12 Units Subcutaneous QHS   levothyroxine  150 mcg Oral Q0600   metoprolol succinate  12.5 mg Oral Daily   pantoprazole  40 mg Oral Daily   sodium chloride flush  3 mL Intravenous Q12H   Continuous Infusions:  sodium chloride     sodium chloride 10 mL/hr at 08/23/22 0628   PRN Meds: sodium chloride, acetaminophen, ondansetron **OR** ondansetron (ZOFRAN) IV, polyethylene glycol, sodium chloride flush   Vital Signs    Vitals:   08/22/22 1125 08/22/22 1643 08/22/22 1951 08/23/22 0350  BP: 97/62 97/74 103/67 108/64  Pulse: 68 83 80 85  Resp: Temp:  98.1 F (36.7 C) 97.9 F (36.6 C) 98.1 F (36.7 C)  TempSrc:  Oral Oral Oral  SpO2: 96% 100% 100% 95%  Weight:    97.2 kg  Height:        Intake/Output Summary (Last 24 hours) at 08/23/2022 0651 Last data filed at 08/23/2022 1610 Gross per 24 hour  Intake 240 ml  Output 2125 ml  Net -1885 ml      08/23/2022    3:50 AM 08/22/2022    4:24 AM 08/21/2022    3:26 AM  Last 3 Weights  Weight (lbs) 214 lb 4.6 oz 218 lb 7.6 oz 220 lb 10.9 oz  Weight (kg) 97.2 kg 99.1 kg 100.1 kg      Telemetry    NSR - Personally Reviewed  ECG    No new tracings since 4/22 - Personally Reviewed  Physical Exam   GEN: No acute distress.  Laying comfortably with  head elevated  Neck: No JVD Cardiac: RRR. Grade 3/6 systolic murmur. Radial pulses 2+ bilaterally  Respiratory: Crackles in lung bases. Normal work of breathing on [facility] GI: Soft, nontender, non-distended  MS: 1+ edema in BLE; No deformity. Neuro:  Nonfocal  Psych: Normal affect   Labs    High Sensitivity Troponin:   Recent Labs  Lab 08/18/22 1344 08/18/22 1607 08/19/22 0228  TROPONINIHS 1,096* 1,323* 1,227*     Chemistry Recent Labs  Lab 08/21/22 0321 08/22/22 0213 08/23/22 0252  NA 136 137 140  K 4.7 4.7 4.6  CL 106 104 101  CO2 GLUCOSE 145* 125* 144*  BUN 34* 28* 26*  CREATININE 1.29* 1.17* 1.21*  CALCIUM 8.6* 8.6* 8.7*  MG  --   --  1.7  GFRNONAA 49* 55* 53*  ANIONGAP Lipids No results for input(s): "CHOL", "TRIG", "HDL", "LABVLDL", "LDLCALC", "CHOLHDL" in the last 168 hours.  Hematology Recent Labs  Lab 08/21/22 0321 08/22/22 0213 08/23/22 0252  WBC 6.6 5.8 5.1  RBC 2.68* 2.68* 2.61*  HGB 7.5* 7.5* 7.4*  HCT 24.1* 24.6* 23.2*  MCV 89.9 91.8 88.9  MCH 28.0 28.0 28.4  MCHC 31.1 30.5 31.9  RDW 17.6* 17.5* 17.4*  PLT 305 301 291   Thyroid No results for input(s): "TSH", "FREET4" in the last 168 hours.  BNP Recent Labs  Lab 08/18/22 1344  BNP 486.0*    DDimer No results for input(s): "DDIMER" in the last 168 hours.   Radiology    No results found.  Cardiac Studies   Echocardiogram 08/19/22  1. Left ventricular ejection fraction, by estimation, is 40 to 45%. The  left ventricle has mildly decreased function. The left ventricle  demonstrates regional wall motion abnormalities (see scoring  diagram/findings for description). There is mild left  ventricular hypertrophy. Left ventricular diastolic parameters are  indeterminate.   2. Right ventricular systolic function is normal. The right ventricular  size is normal. Tricuspid regurgitation signal is inadequate for assessing  PA pressure.   3. Left atrial size was mildly  dilated.   4. A small pericardial effusion is present.   5. The mitral valve is degenerative. Severe mitral valve regurgitation.  No evidence of mitral stenosis.   6. The aortic valve was not well visualized. There is moderate  calcification of the aortic valve. There is moderate thickening of the  aortic valve. Aortic valve regurgitation is trivial. Mild aortic valve  stenosis. Aortic valve mean gradient measures  10.0 mmHg.   7. The inferior vena cava is dilated in size with <50% respiratory  variability, suggesting right atrial pressure of 15 mmHg.   Patient Profile     57 y.o. female with PMH of PAD (s/p multiple interventions and most recent bilateral TMA 08/08/22), HLD, type 2 DM, GERD, hypothyroidism, cardiology is following for CHF, NSTEMI.   Assessment & Plan    Acute Systolic Heart Failure Severe Mitral Valve Regurgitation  -Patient presented to [facility] ER on 4/20 complaining of dyspnea on exertion and orthopnea.  BNP was elevated to 486. hsTn [phone] -CTA chest on 4/20 showed no PE.  Did show pulmonary edema with small-moderate layering bilateral pleural effusions, probable compressive atelectasis versus consolidation of lung bases, mild enlarged mediastinal and bilateral hilar lymph nodes, trace pericardial effusion, aortic and coronary artery atherosclerosis. -Echocardiogram this admission showed EF 40-45% with regional wall motion abnormalities, mild LVH, normal RV systolic function, small pericardial effusion, severe mitral valve regurgitation, mild aortic valve stenosis - Echocardiogram from 2021 showed normal mitral valve function. Severe mitral valve regurgitation is newly discovered  - Patient has been diuresing on IV lasix- output 1.13 L urine yesterday. Weight down about 8 lbs. Renal function stable. Continues to have some ankle edema and crackles in lungs  - Continue IV lasix 60 mg BID  - Patient can now lay flat without discomfort- OK to proceed with cardiac  catheterization today  - BP somewhat low, which limits GDMT. Continue metoprolol succinate 12.5 mg. Plan to add SGLT2i after cath    NSTEMI  - [id number] - Given patient's extensive history of PAD, suspect patient also has underlying CAD. With wall motion abnormalities on Echo, concerned for ischemic cardiomyopathy  - Plan for cardiac catheterization today- She can lay flat without discomfort  - Continue metoprolol, ASA, plavix, lipitor     HTN  - Home amlodipine held to allow titration of GDMT - Continue metoprolol succinate 12.5 mg daily    HLD  - Lipid panel 4/5 showed LDL 54 - Continue lipitor 40 mg daily   Acute blood loss anemia from  recent bilateral metatarsal amputation  - Hemoglobin was down to 6.1 on admission. Now patient is S/p 1 unit of PRBC  - Hemoglobin stable and has been sitting around 7.5. Management per primary  - Fecal Occult Blood was negative on admission. GI planning to follow as an outpatient  - Will confirm with MD if patient can undergo cath.   For questions or updates, please contact [facility] Please consult [email or web address] for contact info under  Signed, Jonita Albee, PA-C  08/23/2022, 6:51 AM    Patient seen and examined, note reviewed with the signed Advanced Practice Provider. I personally reviewed laboratory data, imaging studies and relevant notes. I independently examined the patient and formulated the important aspects of the plan. I have personally discussed the plan with the patient and/or family. Comments or changes to the note/plan are indicated below.  She is status post heart catheterization, multivessel heart disease.  She will benefit from CT surgery evaluation for CABG and MVR.  Thomasene Ripple DO, MS [facility] Attending Cardiologist [facility]  [address] [place], [place] [address] [phone] Website: [email or web address]

## 2022-08-23 NOTE — Progress Notes (Signed)
   Heart Failure Stewardship Pharmacist Progress Note   PCP: [facility] PCP-Cardiologist: Jodelle Red, MD    HPI:  57 yo F with PMH of T2DM, HTN, hypothyroidism, PAD, GERD and chronic anemia.   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 She had increased bleeding from the surgical sites after discharge.   Presented to the ED on 4/20 with shortness of breath and orthopnea over the last 3 days. CXR showed bilateral interstitial changes with small pleural effusions. CTA showed no evidence of a PE but was noted to have pulmonary edema with small to moderate layering bilateral effusions and probable compressive atelectasis versus consolidation in the lung bases. She was transferred from [facility] to [facility] for further evaluation. Troponin elevation secondary to demand ischemia. ECHO 4/21 with LVEF 40-45%, regional wall motion abnormalities, mild LVH, RV normal. Taken for cath and found to have severe three vessel CAD and severe MR, recommending CABG evaluation. RA 9, PA 32, wedge 21, CO 8.6, CI 4.3.  Current HF Medications: Diuretic: furosemide 60 mg IV BID Beta Blocker: metoprolol XL 12.5 mg daily  Prior to admission HF Medications: None *also on amlodipine 10 mg daily  Pertinent Lab Values: Serum creatinine 1.21, BUN 26, Potassium 4.6, Sodium 140, Magnesium 1.7, BNP 486, A1c 6.4   Vital Signs: Weight: 214 lbs (admission weight: 222 lbs) Blood pressure: 110/60s  Heart rate: 80s  I/O: -1.9L yesterday; net -5.3L  Medication Assistance / Insurance Benefits Check: Does the patient have prescription insurance?  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 Yes Is the patient willing to transition their outpatient pharmacy to utilize a [facility] outpatient pharmacy?   Pending     Assessment: 1. 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 NYHA class III symptoms. - Continue furosemide 60 mg IV BID. Strict I/Os and daily weights. Keep K>4 and Mg>2. - Continue metoprolol XL 12.5 mg daily - May be able to add low dose ARB and MRA once off IV lasix, pending plans for CABG - Consider adding SGLT2i prior to discharge - Agree with holding amlodipine, would not resume at discharge   Plan: 1) Medication changes recommended at this time: - Continue current regimen  2) Patient assistance: - Farxiga copay $25 - copay card lowers to $0 per month - Jardiance copay $25 - copay card lowers to $10 per month - Entresto copay $30 - copay card lowers to $10 per fill (30-90 day supply)  3)  Education  - To be completed prior to discharge  Sharen Hones, PharmD, BCPS Heart Failure Stewardship Pharmacist Phone [phone]

## 2022-08-23 NOTE — Progress Notes (Signed)
PROGRESS NOTE    Kayla Schmitt  EAV:[id number] DOB: Mar 27, 1966 DOA: 08/18/2022  PCP: [facility]   Brief Narrative:  This 57 y.o. female with medical history significant of type 2 diabetes on insulin, hypertension, hypothyroidism, peripheral artery disease on antiplatelet therapy, GERD, chronic anemia.  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  The patient reports having increased bleeding when she went home from the surgical incision sites.  She reports bleeding a fair amount, but did not come back to the hospital for evaluation.  Few days ago, the bleeding stopped and has remained hemostatic for the past several days.  She has been noting increased difficulty breathing and shortness of breath.  She is having difficulty laying down flat.  She is admitted for NSTEMI with systolic heart failure.  Cardiology is consulted, Patient underwent left heart cath found to have Severe, calcific three-vessel coronary artery disease. Severe mitral regurgitation by noninvasive workup.    Assessment & Plan:   Principal Problem:   NSTEMI (non-ST elevated myocardial infarction) Active Problems:   DM type 2 (diabetes mellitus, type 2)   Hypertension   Hypothyroidism   Acute clinical systolic heart failure   Acute blood loss anemia   Anemia  Acute hypoxic respiratory failure: Acute systolic heart failure in setting of NSTEMI: She is still requiring upto 4 lit of [place] Oxygen. She was initially started on lasix 40 mg bid, increased to 60 mg IV BID, monitor urine output.  ECHO showed LVEF of 40 to 45%, mildly decreased function, with regional wall motion abnormalities. Diastolic parameters are indeterminate. Severe MVR. Cardiology on board , Suspect ischemic cardiomyopathy, recommended cardiac cath once euvolemic.  Elevated troponins probably from demand ischemia from CHF and anemia.  Currently on aspirin and plavix and lipitor.  Patient  underwent left heart cath found to have severe, calcific three-vessel coronary artery disease, severe MR. Cardiothoracic surgery consulted for possible CABG and MVR.   Symptomatic anemia: Acute blood loss anemia from recent bilateral metatarsal amputation: Hb 6.1 on Admission > S/p 1 unit of prbc transfusion.  Post transfusion hemoglobin is 7.7, improved to 8.3 and dropped back to 7.5. Stool for occult blood is negative on admission. She denies any melena or use of NSAIDS.  Anemia panel not significant.  Baseline hemoglobin  between 9.8 to 11 in 2020 and 10.1 in 01/2022.  GI consulted, recommended outpatient work up for now.  Transfuse to keep hemoglobin  greater than 7.      Insulin dependent DM : CBG (last 3)  Recent Labs (last 2 labs)       Recent Labs    08/20/22 2113 08/21/22 0836 08/21/22 1203  GLUCAP 137* 115* 164*       Continue SSI and semglee 12 units at bedtime.  No changes in meds.      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  Wound care as per Dr Lajoyce Corners.     Critical limb ischemia / PVD: 4/4- Left SFA and above knee popliteal artery mechanical thrombectomy, with angioplasty and stenting of left above knee popliteal artery and SFA   4/8- Right SFA and above knee popliteal artery laser atherectomy, with DCBA of the right above knee popliteal artery and SFA.  She was discharged on aspirin and plavix on 4/15 by vascular surgery.  Continue with aspirin and plavix and lipitor.    Hypothyroidism:  Continue synthroid.    Acute Kidney Injury: Looks  like her baseline creatinine is less than 1.0,  She admitted with a creatinine of 1.41, improved to 1.17 today.  Continue to monitor. Avoid nephrotoxic medications.   Estimated body mass index is 37.88 kg/m as calculated from the following:   Height as of this encounter:  (1.626 m).   Weight as of this encounter: 100.1 kg.   DVT prophylaxis:Lovenox Code  Status: Full code Family Communication: No family at bed side. Disposition Plan:    Status is: Inpatient Remains inpatient appropriate because: Admitted  for systolic heart failure.  NSTEMI requiring left heart cath once euvolemic. Left heart cath shows calcific three-vessel disease.  Cardiothoracic surgery consulted for CABG and MVR   Consultants:  Cardiology Gastroenterology  Procedures: None  Antimicrobials: Anti-infectives (From admission, onward)    None        Subjective: Patient was seen and examined at bedside.  Overnight events noted.   Patient reports doing better.  She was able to lie flat.   She underwent left heart cath found to have three-vessel disease.  Objective: Vitals:   08/23/22 0919 08/23/22 0924 08/23/22 0929 08/23/22 0944  BP: 114/67 120/70 110/67 116/71  Pulse: 87 87 88 (!) 0  Resp: 19 15 (!) 22   Temp:      TempSrc:      SpO2: 90% 91% (!) 89%   Weight:      Height:        Intake/Output Summary (Last 24 hours) at 08/23/2022 1411 Last data filed at 08/23/2022 1111 Gross per 24 hour  Intake 240 ml  Output 1825 ml  Net -1585 ml   Filed Weights   08/21/22 0326 08/22/22 0424 08/23/22 0350  Weight: 100.1 kg 99.1 kg 97.2 kg    Examination:  General exam: Appears comfortable, not in any acute distress.  Deconditioned Respiratory system: Clear to auscultation. Respiratory effort normal. RR16 Cardiovascular system: S1 & S2 heard, regular rate and rhythm, no murmur. Gastrointestinal system: Abdomen is soft, non tender, non distended, BS+ Central nervous system: Alert and oriented x 3. No focal neurological deficits. Extremities: Bilateral transmetatarsal amputation, dressing noted Skin: No rashes, lesions or ulcers Psychiatry: Judgement and insight appear normal. Mood & affect appropriate.     Data Reviewed: I have personally reviewed following labs and imaging studies  CBC: Recent Labs  Lab 08/18/22 1344 08/19/22 0229 08/19/22 0915  08/20/22 0244 08/21/22 0321 08/22/22 0213 08/23/22 0252  WBC 7.3 7.3  --  8.3 6.6 5.8 5.1  NEUTROABS 4.7  --   --   --   --   --   --   HGB 6.1* 7.5* 7.7* 8.3* 7.5* 7.5* 7.4*  HCT 20.4* 24.1* 24.7* 25.4* 24.1* 24.6* 23.2*  MCV 93.2 89.3  --  88.5 89.9 91.8 88.9  PLT 304 297  --  297 305 301 291   Basic Metabolic Panel: Recent Labs  Lab 08/19/22 0228 08/20/22 0244 08/21/22 0321 08/22/22 0213 08/23/22 0252  NA 136 138 136 137 140  K 3.8 4.9 4.7 4.7 4.6  CL 103 104 106 104 101  CO2 GLUCOSE 147* 155* 145* 125* 144*  BUN 35* 35* 34* 28* 26*  CREATININE 1.41* 1.32* 1.29* 1.17* 1.21*  CALCIUM 8.2* 8.4* 8.6* 8.6* 8.7*  MG  --   --   --   --  1.7  PHOS  --   --   --   --  4.7*   GFR: Estimated Creatinine Clearance: 59.5 mL/min (  A) (by C-G formula based on SCr of 1.21 mg/dL (H)). Liver Function Tests: No results for input(s): "AST", "ALT", "ALKPHOS", "BILITOT", "PROT", "ALBUMIN" in the last 168 hours. No results for input(s): "LIPASE", "AMYLASE" in the last 168 hours. No results for input(s): "AMMONIA" in the last 168 hours. Coagulation Profile: Recent Labs  Lab 08/18/22 1402  INR 1.2   Cardiac Enzymes: No results for input(s): "CKTOTAL", "CKMB", "CKMBINDEX", "TROPONINI" in the last 168 hours. BNP (last 3 results) No results for input(s): "PROBNP" in the last 8760 hours. HbA1C: No results for input(s): "HGBA1C" in the last 72 hours. CBG: Recent Labs  Lab 08/22/22 1122 08/22/22 1642 08/22/22 2118 08/23/22 0829 08/23/22 1128  GLUCAP 154* 144* 178* 129* 132*   Lipid Profile: No results for input(s): "CHOL", "HDL", "LDLCALC", "TRIG", "CHOLHDL", "LDLDIRECT" in the last 72 hours. Thyroid Function Tests: No results for input(s): "TSH", "T4TOTAL", "FREET4", "T3FREE", "THYROIDAB" in the last 72 hours. Anemia Panel: No results for input(s): "VITAMINB12", "FOLATE", "FERRITIN", "TIBC", "IRON", "RETICCTPCT" in the last 72 hours. Sepsis Labs: No results for  input(s): "PROCALCITON", "LATICACIDVEN" in the last 168 hours.  No results found for this or any previous visit (from the past 240 hour(s)).   Radiology Studies: CARDIAC CATHETERIZATION  Result Date: 08/23/2022   Ost LAD to Prox LAD lesion is 75% stenosed.   Ost Cx to Prox Cx lesion is 100% stenosed.   1st Diag lesion is 25% stenosed.   Mid RCA lesion is 70% stenosed.   Mid LM to Dist LM lesion is 40% stenosed.   Mid LAD lesion is 70% stenosed.   LV end diastolic pressure is moderately elevated.   Hemodynamic findings consistent with mild pulmonary hypertension.   There is no aortic valve stenosis.   Aortic saturation 91%, PA saturation 60%, mean RA pressure 9 mmHg, PA pressure 42/24, mean PA pressure 32 mmHg, mean pulmonary capillary wedge pressure 24 mmHg, V waves noted to 45 mmHg.  Cardiac output 8.58 L/min, cardiac index 4.26. Severe, calcific three-vessel coronary artery disease.  Severe mitral regurgitation by noninvasive workup.  Significant V waves noted on the wedge tracing.  Will obtain cardiac surgery consult for CABG and mitral valve repair.    Scheduled Meds:  aspirin EC  81 mg Oral Daily   atorvastatin  40 mg Oral Daily   clopidogrel  75 mg Oral Daily   enoxaparin (LOVENOX) injection  40 mg Subcutaneous Q24H   famotidine  20 mg Oral Daily   furosemide  60 mg Intravenous BID   gabapentin  300 mg Oral QHS   insulin aspart  0-15 Units Subcutaneous TID WC   insulin aspart  0-5 Units Subcutaneous QHS   insulin glargine-yfgn  12 Units Subcutaneous QHS   levothyroxine  150 mcg Oral Q0600   metoprolol succinate  12.5 mg Oral Daily   pantoprazole  40 mg Oral Daily   sodium chloride flush  3 mL Intravenous Q12H   sodium chloride flush  3 mL Intravenous Q12H   Continuous Infusions:  sodium chloride       LOS: 5 days    Time spent: 35 Mins    Willeen Niece, MD [facility]   If 7PM-7AM, please contact night-coverage

## 2022-08-24 ENCOUNTER — Other Ambulatory Visit (HOSPITAL_COMMUNITY): Payer: Self-pay

## 2022-08-24 DIAGNOSIS — I214 Non-ST elevation (NSTEMI) myocardial infarction: Secondary | ICD-10-CM | POA: Diagnosis not present

## 2022-08-24 LAB — BASIC METABOLIC PANEL
Anion gap: 10 (ref 5–15)
BUN: 23 mg/dL — ABNORMAL HIGH (ref 6–20)
CO2: 27 mmol/L (ref 22–32)
Calcium: 8.4 mg/dL — ABNORMAL LOW (ref 8.9–10.3)
Chloride: 99 mmol/L (ref 98–111)
Creatinine, Ser: 1.09 mg/dL — ABNORMAL HIGH (ref 0.44–1.00)
GFR, Estimated: 60 mL/min — ABNORMAL LOW (ref >60)
Glucose, Bld: 111 mg/dL — ABNORMAL HIGH (ref 70–99)
Potassium: 3.9 mmol/L (ref 3.5–5.1)
Sodium: 136 mmol/L (ref 135–145)

## 2022-08-24 LAB — CBC
HCT: 24.4 % — ABNORMAL LOW (ref 36.0–46.0)
Hemoglobin: 7.7 g/dL — ABNORMAL LOW (ref 12.0–15.0)
MCH: 28.1 pg (ref 26.0–34.0)
MCHC: 31.6 g/dL (ref 30.0–36.0)
MCV: 89.1 fL (ref 80.0–100.0)
Platelets: 298 10*3/uL (ref 150–400)
RBC: 2.74 MIL/uL — ABNORMAL LOW (ref 3.87–5.11)
RDW: 17.1 % — ABNORMAL HIGH (ref 11.5–15.5)
WBC: 4.6 10*3/uL (ref 4.0–10.5)
nRBC: 0 % (ref 0.0–0.2)

## 2022-08-24 LAB — GLUCOSE, CAPILLARY
Glucose-Capillary: 112 mg/dL — ABNORMAL HIGH (ref 70–99)
Glucose-Capillary: 125 mg/dL — ABNORMAL HIGH (ref 70–99)
Glucose-Capillary: 132 mg/dL — ABNORMAL HIGH (ref 70–99)
Glucose-Capillary: 176 mg/dL — ABNORMAL HIGH (ref 70–99)

## 2022-08-24 LAB — MAGNESIUM: Magnesium: 1.7 mg/dL (ref 1.7–2.4)

## 2022-08-24 LAB — PHOSPHORUS: Phosphorus: 4.6 mg/dL (ref 2.5–4.6)

## 2022-08-24 MED ORDER — EMPAGLIFLOZIN 10 MG PO TABS
10.0000 mg | ORAL_TABLET | Freq: Every day | ORAL | Status: DC
  Administered 2022-08-24 – 2022-08-26 (×3): 10 mg via ORAL
  Filled 2022-08-24 (×3): qty 1

## 2022-08-24 NOTE — Progress Notes (Addendum)
Rounding Note    Patient Name: Kayla Schmitt Date of Encounter: 08/24/2022  [facility] Cardiologist: Jodelle Red, MD   Subjective   Breathing continues to improve, and she would like to try to take off her nasal canula. Ankle edema improved, but she continues to have trace pitting edema bilaterally. No pain near right radial cath site.   Inpatient Medications    Scheduled Meds:  aspirin EC  81 mg Oral Daily   atorvastatin  40 mg Oral Daily   clopidogrel  75 mg Oral Daily   enoxaparin (LOVENOX) injection  40 mg Subcutaneous Q24H   famotidine  20 mg Oral Daily   furosemide  60 mg Intravenous BID   gabapentin  300 mg Oral QHS   insulin aspart  0-15 Units Subcutaneous TID WC   insulin aspart  0-5 Units Subcutaneous QHS   insulin glargine-yfgn  12 Units Subcutaneous QHS   levothyroxine  150 mcg Oral Q0600   metoprolol succinate  12.5 mg Oral Daily   pantoprazole  40 mg Oral Daily   sodium chloride flush  3 mL Intravenous Q12H   sodium chloride flush  3 mL Intravenous Q12H   Continuous Infusions:  sodium chloride     PRN Meds: sodium chloride, acetaminophen, ondansetron (ZOFRAN) IV, ondansetron **OR** [DISCONTINUED] ondansetron (ZOFRAN) IV, mouth rinse, polyethylene glycol, sodium chloride flush   Vital Signs    Vitals:   08/23/22 1737 08/23/22 2000 08/23/22 2003 08/24/22 0346  BP: (!) 102/57  120/64 98/61  Pulse: 88 83 81 77  Resp: 18 19 16 15   Temp:   98.1 F (36.7 C) 98.1 F (36.7 C)  TempSrc:   Oral Oral  SpO2: 99% 100% 98% 98%  Weight:    93.2 kg  Height:        Intake/Output Summary (Last 24 hours) at 08/24/2022 0817 Last data filed at 08/23/2022 2229 Gross per 24 hour  Intake 3 ml  Output 800 ml  Net -797 ml      08/24/2022    3:46 AM 08/23/2022    3:50 AM 08/22/2022    4:24 AM  Last 3 Weights  Weight (lbs) 205 lb 7.5 oz 214 lb 4.6 oz 218 lb 7.6 oz  Weight (kg) 93.2 kg 97.2 kg 99.1 kg      Telemetry    NSR -  Personally Reviewed  ECG    No new tracings since 4/22- Personally Reviewed  Physical Exam   GEN: No acute distress.  Sitting comfortably on the side of the bed eating breakfast  Neck: No JVD Cardiac: RRR, grade 3/6 systolic murmur. Radial pulses 2+ bilaterally. Right radial cath site stable   Respiratory: Crackles in bilateral lung bases. Normal work of breathing on nasal cannula  GI: Soft, nontender MS: Trace edema in BLE. S/P bilateral metatarsal amputations  Neuro:  Nonfocal  Psych: Normal affect   Labs    High Sensitivity Troponin:   Recent Labs  Lab 08/18/22 1344 08/18/22 1607 08/19/22 0228  TROPONINIHS 1,096* 1,323* 1,227*     Chemistry Recent Labs  Lab 08/22/22 0213 08/23/22 0252 08/23/22 0920 08/23/22 0925 08/23/22 0926 08/24/22 0309  NA 137 140   < > 138 138 136  K 4.7 4.6   < > 4.4 4.4 3.9  CL 104 101  --   --   --  99  CO2 27 27  --   --   --  27  GLUCOSE 125* 144*  --   --   --  111*  BUN 28* 26*  --   --   --  23*  CREATININE 1.17* 1.21*  --   --   --  1.09*  CALCIUM 8.6* 8.7*  --   --   --  8.4*  MG  --  1.7  --   --   --  1.7  GFRNONAA 55* 53*  --   --   --  60*  ANIONGAP 6 12  --   --   --  10   < > = values in this interval not displayed.    Lipids No results for input(s): "CHOL", "TRIG", "HDL", "LABVLDL", "LDLCALC", "CHOLHDL" in the last 168 hours.  Hematology Recent Labs  Lab 08/22/22 0213 08/23/22 0252 08/23/22 0920 08/23/22 0925 08/23/22 0926 08/24/22 0309  WBC 5.8 5.1  --   --   --  4.6  RBC 2.68* 2.61*  --   --   --  2.74*  HGB 7.5* 7.4*   < > 8.2* 7.8* 7.7*  HCT 24.6* 23.2*   < > 24.0* 23.0* 24.4*  MCV 91.8 88.9  --   --   --  89.1  MCH 28.0 28.4  --   --   --  28.1  MCHC 30.5 31.9  --   --   --  31.6  RDW 17.5* 17.4*  --   --   --  17.1*  PLT 301 291  --   --   --  298   < > = values in this interval not displayed.   Thyroid No results for input(s): "TSH", "FREET4" in the last 168 hours.  BNP Recent Labs  Lab  08/18/22 1344  BNP 486.0*    DDimer No results for input(s): "DDIMER" in the last 168 hours.   Radiology    CARDIAC CATHETERIZATION  Result Date: 08/23/2022   Ost LAD to Prox LAD lesion is 75% stenosed.   Ost Cx to Prox Cx lesion is 100% stenosed.   1st Diag lesion is 25% stenosed.   Mid RCA lesion is 70% stenosed.   Mid LM to Dist LM lesion is 40% stenosed.   Mid LAD lesion is 70% stenosed.   LV end diastolic pressure is moderately elevated.   Hemodynamic findings consistent with mild pulmonary hypertension.   There is no aortic valve stenosis.   Aortic saturation 91%, PA saturation 60%, mean RA pressure 9 mmHg, PA pressure 42/24, mean PA pressure 32 mmHg, mean pulmonary capillary wedge pressure 24 mmHg, V waves noted to 45 mmHg.  Cardiac output 8.58 L/min, cardiac index 4.26. Severe, calcific three-vessel coronary artery disease.  Severe mitral regurgitation by noninvasive workup.  Significant V waves noted on the wedge tracing.  Will obtain cardiac surgery consult for CABG and mitral valve repair.    Cardiac Studies   Echocardiogram 08/19/22  1. Left ventricular ejection fraction, by estimation, is 40 to 45%. The  left ventricle has mildly decreased function. The left ventricle  demonstrates regional wall motion abnormalities (see scoring  diagram/findings for description). There is mild left  ventricular hypertrophy. Left ventricular diastolic parameters are  indeterminate.   2. Right ventricular systolic function is normal. The right ventricular  size is normal. Tricuspid regurgitation signal is inadequate for assessing  PA pressure.   3. Left atrial size was mildly dilated.   4. A small pericardial effusion is present.   5. The mitral valve is degenerative. Severe mitral valve regurgitation.  No evidence of mitral stenosis.   6.  The aortic valve was not well visualized. There is moderate  calcification of the aortic valve. There is moderate thickening of the  aortic valve. Aortic  valve regurgitation is trivial. Mild aortic valve  stenosis. Aortic valve mean gradient measures  10.0 mmHg.   7. The inferior vena cava is dilated in size with <50% respiratory  variability, suggesting right atrial pressure of 15 mmHg.   Right/Left Heart Catheterization 08/23/22   Ost LAD to Prox LAD lesion is 75% stenosed.   Ost Cx to Prox Cx lesion is 100% stenosed.   1st Diag lesion is 25% stenosed.   Mid RCA lesion is 70% stenosed.   Mid LM to Dist LM lesion is 40% stenosed.   Mid LAD lesion is 70% stenosed.   LV end diastolic pressure is moderately elevated.   Hemodynamic findings consistent with mild pulmonary hypertension.   There is no aortic valve stenosis.   Aortic saturation 91%, PA saturation 60%, mean RA pressure 9 mmHg, PA pressure 42/24, mean PA pressure 32 mmHg, mean pulmonary capillary wedge pressure 24 mmHg, V waves noted to 45 mmHg.  Cardiac output 8.58 L/min, cardiac index 4.26.   Severe, calcific three-vessel coronary artery disease.  Severe mitral regurgitation by noninvasive workup.  Significant V waves noted on the wedge tracing.  Will obtain cardiac surgery consult for CABG and mitral valve repair. Diagnostic Dominance: Right    Patient Profile     57 y.o. female with PMH of PAD (s/p multiple interventions and most recent bilateral TMA 08/08/22), HLD, type 2 DM, GERD, hypothyroidism, cardiology is following for CHF, NSTEMI.   Assessment & Plan    Acute Systolic Heart Failure Severe Mitral Valve Regurgitation  -Patient presented to [facility] ER on 4/20 complaining of dyspnea on exertion and orthopnea.  BNP was elevated to 486. hsTn [phone] -CTA chest on 4/20 showed no PE.  Did show pulmonary edema with small-moderate layering bilateral pleural effusions, probable compressive atelectasis versus consolidation of lung bases, mild enlarged mediastinal and bilateral hilar lymph nodes, trace pericardial effusion, aortic and coronary artery  atherosclerosis. -Echocardiogram this admission showed EF 40-45% with regional wall motion abnormalities, mild LVH, normal RV systolic function, small pericardial effusion, severe mitral valve regurgitation, mild aortic valve stenosis - Echocardiogram from 2021 showed normal mitral valve function. Severe mitral valve regurgitation is newly discovered  - Patient has been diuresing on IV lasix- currently net -5.3 L. Weight down 17.9 lbs. Dry weight is 200 lbs, she weighs 205 today. Creatinine improving (was 1.41 on admission, down to 1.09 today). Continues to have trace ankle edema and is still on 2 L supplemental oxygen   - I believe she needs one more day of IV diuretics- Continue IV lasix 60 mg BID. Likely can transition to PO lasix tomorrow  - BP somewhat low, which limits GDMT. Continue metoprolol succinate 12.5 mg. Start jardiance today    NSTEMI  Severe Three-vessel CAD  - [id number] - Patient underwent Right/Left Heart cath yesterday that showed severe, calcific three-vessel CAD. With severe mitral regurgitation, CT surgery was consulted for CABG and mitral valve repair - CT surgery saw patient yesterday- per their notes, we could either aggressively managed CHF and allow her to recover from recent amputations, perform TEE as outpatient to assess mitral valve, and schedule CABG in 4-6 weeks. Or, we could wait for plavix to wash out and proceed with inpatient TEE and scheduled CABG/MV surgery end of next week - Patient would prefer to have surgery this admission. However,  there may be benefits to waiting a few weeks so that she could recover more from recent metatarsal amputations and her anemia could improve. Patient did have significant shortness of breath prior to admission, but has never had chest pain. Will discuss with MD  - Hold plavix  - Continue metoprolol, ASA, lipitor     HTN  - Home amlodipine held to allow titration of GDMT - Continue metoprolol succinate 12.5 mg daily     HLD  - Lipid panel 4/5 showed LDL 54 - Continue lipitor 40 mg daily    Acute blood loss anemia from recent bilateral metatarsal amputation  - Hemoglobin was down to 6.1 on admission. Now patient is S/p 1 unit of PRBC  - Hemoglobin stable- 7.7 this AM  - Fecal Occult Blood was negative on admission. GI planning to follow as an outpatient  For questions or updates, please contact [facility] Please consult [email or web address] for contact info under    Signed, Jonita Albee, PA-C  08/24/2022, 8:17 AM    Patient seen and examined, note reviewed with the signed Advanced Practice Provider. I personally reviewed laboratory data, imaging studies and relevant notes. I independently examined the patient and formulated the important aspects of the plan. I have personally discussed the plan with the patient and/or family. Comments or changes to the note/plan are indicated below.  Still need IV diuretics atleast for the next 24 hrs.   Significant multivessel CAD, currently Plavix washout- stopped yesterday. Seen by surgery yesterday discussed various options with patient. Ideally she will prefer to stay inpatient to wash out. We may need to have further discussion once she is fully  euvolemic if CT surgery wants to delay due to her recent foot surgery.   She needs TEE will get her on the schedule for next week Monday    Thomasene Ripple DO, MS [facility] Attending Cardiologist [facility]  [address] [place], [place] [address] [phone] Website: [email or web address]

## 2022-08-24 NOTE — TOC Benefit Eligibility Note (Signed)
Patient Product/process development scientist completed.    The patient is currently admitted and upon discharge could be taking Jardiance.  The current 30 day co-pay is $25.00.     Patient Product/process development scientist completed.    The patient is currently admitted and upon discharge could be taking [place].  The current 30 day co-pay is $25.00. Possibility of lower co-pay if [facility]   The patient is insured through [employer]   This test claim was processed through [employer]- copay amounts may vary at other pharmacies due to pharmacy/plan contracts, or as the patient moves through the different stages of their insurance plan.

## 2022-08-24 NOTE — Progress Notes (Signed)
   Heart Failure Stewardship Pharmacist Progress Note   PCP: [facility] PCP-Cardiologist: Jodelle Red, MD    HPI:  57 yo F with PMH of T2DM, HTN, hypothyroidism, PAD, GERD and chronic anemia.   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 She had increased bleeding from the surgical sites after discharge.   Presented to the ED on 4/20 with shortness of breath and orthopnea over the last 3 days. CXR showed bilateral interstitial changes with small pleural effusions. CTA showed no evidence of a PE but was noted to have pulmonary edema with small to moderate layering bilateral effusions and probable compressive atelectasis versus consolidation in the lung bases. She was transferred from [facility] to [facility] for further evaluation. Troponin elevation secondary to demand ischemia. ECHO 4/21 with LVEF 40-45%, regional wall motion abnormalities, mild LVH, RV normal. Taken for cath and found to have severe three vessel CAD and severe MR, recommending CABG evaluation. RA 9, PA 32, wedge 21, CO 8.6, CI 4.3.  Current HF Medications: Diuretic: furosemide 60 mg IV BID Beta Blocker: metoprolol XL 12.5 mg daily SGLT2i: Jardiance 10 mg daily  Prior to admission HF Medications: None *also on amlodipine 10 mg daily  Pertinent Lab Values: Serum creatinine 1.09, BUN 23, Potassium 3.9, Sodium 136, Magnesium 1.7, BNP 486, A1c 6.4   Vital Signs: Weight: 205 lbs (admission weight: 222 lbs) Blood pressure: 100/60s  Heart rate: 70-80s  I/O: -1.1L yesterday; net -6L  Medication Assistance / Insurance Benefits Check: Does the patient have prescription insurance?  Yes Type of insurance plan: Chartered loss adjuster  Outpatient Pharmacy:  Prior to admission outpatient pharmacy: [facility] Is the patient willing to use [facility] TOC pharmacy at discharge? Yes Is the patient willing to transition their outpatient pharmacy to utilize a [facility] outpatient  pharmacy?   Pending    Assessment: 1. 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 NYHA class III symptoms. - Continue furosemide 60 mg IV BID. Strict I/Os and daily weights. Keep K>4 and Mg>2. - Continue metoprolol XL 12.5 mg daily - May be able to add low dose ARB and MRA once off IV lasix, pending plans for CABG - Agree with adding Jardiance 10 mg daily - Agree with holding amlodipine, would not resume at discharge   Plan: 1) Medication changes recommended at this time: - Agree with changes  2) Patient assistance: - Farxiga copay $25 - copay card lowers to $0 per month - Jardiance copay $25 - copay card lowers to $10 per month - Entresto copay $30 - copay card lowers to $10 per fill (30-90 day supply)  3)  Education  - To be completed prior to discharge  Sharen Hones, PharmD, BCPS Heart Failure Stewardship Pharmacist Phone [phone]

## 2022-08-24 NOTE — TOC Initial Note (Signed)
Transition of Care [facility]) - Initial/Assessment Note    Patient Details  Name: Kayla Schmitt MRN: [id number] Date of Birth: [birth year]  Transition of Care [facility]) CM/SW Contact:    Gala Lewandowsky, RN Phone Number: 08/24/2022, 3:19 PM  Clinical Narrative: Patient presented for shortness of breath. PTA patient states she was from home with her mother and a friend. Patient has DME slide board, wheelchair, and bedside commode. Patient reports that her mother was taking her to appointments prior to admission. Post R/LHC. Case Manager will continue to follow for transition of care needs.                Expected Discharge Plan: Home w Home Health Services Barriers to Discharge: Continued Medical Work up  Patient Goals and CMS Choice Patient states their goals for this hospitalization and ongoing recovery are:: plan to return home  Expected Discharge Plan and Services In-house Referral: NA Discharge Planning Services: CM Consult Post Acute Care Choice: Home Health, Resumption of Svcs/PTA Provider Living arrangements for the past 2 months: Single Family Home     DME Agency: NA   HH Arranged: PT HH Agency: [facility] Date [facility] Agency Contacted: 08/24/22 Time HH Agency Contacted: 1519 Representative spoke with at [facility] Agency: Bjorn Loser  Prior Living Arrangements/Services Living arrangements for the past 2 months: Single Family Home Lives with:: Parents Patient language and need for interpreter reviewed:: Yes Do you feel safe going back to the place where you live?: Yes      Need for Family Participation in Patient Care: Yes (Comment) Care giver support system in place?: Yes (comment) Current home services: DME (wheelchair, bedside commode, slide board) Criminal Activity/Legal Involvement Pertinent to Current Situation/Hospitalization: No - Comment as needed  Activities of Daily Living Home Assistive Devices/Equipment: Walker (specify type) ADL Screening (condition  at time of admission) Patient's cognitive ability adequate to safely complete daily activities?: Yes Is the patient deaf or have difficulty hearing?: No Does the patient have difficulty seeing, even when wearing glasses/contacts?: No Does the patient have difficulty concentrating, remembering, or making decisions?: No Patient able to express need for assistance with ADLs?: Yes Does the patient have difficulty dressing or bathing?: No Independently performs ADLs?: Yes (appropriate for developmental age) Does the patient have difficulty walking or climbing stairs?: Yes Weakness of Legs: Both Weakness of Arms/Hands: None  Permission Sought/Granted Permission sought to share information with : Case Manager, Magazine features editor, Family Supports Permission granted to share information with : Yes, Verbal Permission Granted     Permission granted to share info w AGENCY: [facility]        Emotional Assessment Appearance:: Appears stated age Attitude/Demeanor/Rapport: Engaged Affect (typically observed): Appropriate Orientation: : Oriented to Self, Oriented to Place, Oriented to  Time, Oriented to Situation Alcohol / Substance Use: Not Applicable Psych Involvement: No (comment)  Admission diagnosis:  Elevated troponin [R79.89] NSTEMI (non-ST elevated myocardial infarction) (HCC) [I21.4] Anemia, unspecified type [D64.9] Congestive heart failure, unspecified HF chronicity, unspecified heart failure type (HCC) [I50.9] Patient Active Problem List   Diagnosis Date Noted   NSTEMI (non-ST elevated myocardial infarction) (HCC) 08/18/2022   Acute clinical systolic heart failure (HCC) 08/18/2022   Acute blood loss anemia 08/18/2022   Anemia 08/18/2022   Critical limb ischemia of both lower extremities (HCC) 06/19/2022   DM type 2 (diabetes mellitus, type 2) (HCC) 01/28/2020   Hyperlipidemia 01/28/2020   Hypertension 01/28/2020   Hypothyroidism 01/28/2020   Obesity 01/28/2020    Ulcerated, foot, right,  limited to breakdown of skin (HCC)    Cellulitis of right foot    Peripheral arterial disease (HCC)    Cellulitis 01/11/2020   Blister of leg 01/06/2020   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 - [place], [place] - [address] [place] [address] Phone: [phone] Fax: [phone]  [facility] [address] [place] [address] Phone: [phone] Fax: [phone]  Social Determinants of Health (SDOH) Social History: SDOH Screenings   Food Insecurity: No Food Insecurity (08/20/2022)  Housing: Low Risk  (08/20/2022)  Transportation Needs: No Transportation Needs (08/20/2022)  Utilities: Not At Risk (08/20/2022)  Tobacco Use: Medium Risk (08/23/2022)   Readmission Risk Interventions    08/14/2022    4:37 PM  Readmission Risk Prevention Plan  Post Dischage Appt Complete  Medication Screening Complete  Transportation Screening Complete

## 2022-08-24 NOTE — Progress Notes (Signed)
PROGRESS NOTE    Yvette Loveless  OAC:[id number] DOB: 05/23/1965 DOA: 08/18/2022  PCP: [facility]   Brief Narrative:  This 57 y.o. female with medical history significant of type 2 diabetes on insulin, hypertension, hypothyroidism, peripheral artery disease on antiplatelet therapy, GERD, chronic anemia.  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  The patient reports having increased bleeding when she went home from the surgical incision sites.  She reports bleeding a fair amount, but did not come back to the hospital for evaluation.  Few days ago, the bleeding stopped and has remained hemostatic for the past several days.  She has been noting increased difficulty breathing and shortness of breath.  She is having difficulty laying down flat.  She is admitted for NSTEMI with systolic heart failure. Cardiology is consulted, Patient underwent left heart cath found to have Severe, calcific three-vessel coronary artery disease. Severe mitral regurgitation by noninvasive workup. Ctsx is consulted, needs intervention   Assessment & Plan:   Principal Problem:   NSTEMI (non-ST elevated myocardial infarction) (HCC) Active Problems:   DM type 2 (diabetes mellitus, type 2) (HCC)   Hypertension   Hypothyroidism   Acute clinical systolic heart failure (HCC)   Acute blood loss anemia   Anemia  Acute hypoxic respiratory failure: Acute systolic heart failure in setting of NSTEMI: She is still requiring upto 4 lit of [place] Oxygen. She was initially started on lasix 40 mg bid, increased to 60 mg IV BID, monitor urine output.  ECHO showed LVEF of 40 to 45%, mildly decreased function, with regional wall motion abnormalities. Diastolic parameters are indeterminate. Severe MVR. Cardiology on board , Suspect ischemic cardiomyopathy, recommended cardiac cath once euvolemic.  Elevated troponins probably from demand ischemia from CHF and anemia.   Currently on aspirin and plavix and lipitor.  Patient underwent left heart cath found to have severe, calcific three-vessel coronary artery disease, severe MR. Cardiothoracic surgery consulted for possible CABG and MVR. She is scheduled to have TEE on Monday.   Symptomatic anemia: Acute blood loss anemia from recent bilateral metatarsal amputation: Hb 6.1 on Admission > S/p 1 unit of prbc transfusion.  Post transfusion hemoglobin is 7.7, improved to 8.3 and dropped back to 7.5. Stool for occult blood is negative on admission. She denies any melena or use of NSAIDS.  Anemia panel not significant.  Baseline hemoglobin  between 9.8 to 11 in 2020 and 10.1 in 01/2022.  GI consulted, recommended outpatient work up for now.  Transfuse to keep hemoglobin  greater than 7.      Insulin dependent DM : Continue SSI and semglee 12 units at bedtime.  No changes in meds.      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  Wound care as per Dr Lajoyce Corners.     Critical limb ischemia / PVD: 4/4- Left SFA and above knee popliteal artery mechanical thrombectomy, with angioplasty and stenting of left above knee popliteal artery and SFA   4/8- Right SFA and above knee popliteal artery laser atherectomy, with DCBA of the right above knee popliteal artery and SFA.  She was discharged on aspirin and plavix on 4/15 by vascular surgery.  Continue with aspirin and plavix and lipitor.    Hypothyroidism:  Continue synthroid.    Acute Kidney Injury: Looks like her baseline creatinine is less than 1.0,  She admitted with a creatinine of 1.41, improved to 1.09 today.  Continue to  monitor. Avoid nephrotoxic medications.   Estimated body mass index is 37.88 kg/m as calculated from the following:   Height as of this encounter: 5\' 4"  (1.626 m).   Weight as of this encounter: 100.1 kg.   DVT prophylaxis:Lovenox Code Status: Full code Family Communication: No  family at bed side. Disposition Plan:    Status is: Inpatient Remains inpatient appropriate because: Admitted  for systolic heart failure.  NSTEMI requiring left heart cath once euvolemic. Left heart cath shows calcific three-vessel disease.  Cardiothoracic surgery consulted for CABG and MVR.  Scheduled for TEE on Monday   Consultants:  Cardiology Gastroenterology  Procedures: None  Antimicrobials: Anti-infectives (From admission, onward)    None        Subjective: Patient was seen and examined at bedside.  Overnight events noted.   Patient reports doing better.  She is able to lie flat now. She underwent left heart cath found to have three-vessel disease.  Objective: Vitals:   08/23/22 2000 08/23/22 2003 08/24/22 0346 08/24/22 0842  BP:  120/64 98/61 (!) 106/58  Pulse: 83 81 77 80  Resp: 19 16 15 19   Temp:  98.1 F (36.7 C) 98.1 F (36.7 C)   TempSrc:  Oral Oral   SpO2: 100% 98% 98% 100%  Weight:   93.2 kg   Height:        Intake/Output Summary (Last 24 hours) at 08/24/2022 1411 Last data filed at 08/24/2022 1200 Gross per 24 hour  Intake 502 ml  Output 1325 ml  Net -823 ml   Filed Weights   08/22/22 0424 08/23/22 0350 08/24/22 0346  Weight: 99.1 kg 97.2 kg 93.2 kg    Examination:  General exam: Appears comfortable, deconditioned, not in any distress. Respiratory system: Clear to auscultation bilaterally. Respiratory effort normal. RR 15 Cardiovascular system: S1 & S2 heard, regular rate and rhythm, no murmur. Gastrointestinal system: Abdomen is soft, non tender, non distended, BS+ Central nervous system: Alert and oriented x 3. No focal neurological deficits. Extremities: Bilateral transmetatarsal amputation, dressing noted Skin: No rashes, lesions or ulcers Psychiatry: Judgement and insight appear normal. Mood & affect appropriate.     Data Reviewed: I have personally reviewed following labs and imaging studies  CBC: Recent Labs  Lab  08/18/22 1344 08/19/22 0229 08/20/22 0244 08/21/22 0321 08/22/22 0213 08/23/22 0252 08/23/22 0920 08/23/22 0925 08/23/22 0926 08/24/22 0309  WBC 7.3   < > 8.3 6.6 5.8 5.1  --   --   --  4.6  NEUTROABS 4.7  --   --   --   --   --   --   --   --   --   HGB 6.1*   < > 8.3* 7.5* 7.5* 7.4* 7.8* 8.2* 7.8* 7.7*  HCT 20.4*   < > 25.4* 24.1* 24.6* 23.2* 23.0* 24.0* 23.0* 24.4*  MCV 93.2   < > 88.5 89.9 91.8 88.9  --   --   --  89.1  PLT 304   < > 297 305 301 291  --   --   --  298   < > = values in this interval not displayed.   Basic Metabolic Panel: Recent Labs  Lab 08/20/22 0244 08/21/22 0321 08/22/22 0213 08/23/22 0252 08/23/22 0920 08/23/22 0925 08/23/22 0926 08/24/22 0309  NA 138 136 137 140 138 138 138 136  K 4.9 4.7 4.7 4.6 4.4 4.4 4.4 3.9  CL 104 106 104 101  --   --   --  99  CO2 24 24 27 27   --   --   --  27  GLUCOSE 155* 145* 125* 144*  --   --   --  111*  BUN 35* 34* 28* 26*  --   --   --  23*  CREATININE 1.32* 1.29* 1.17* 1.21*  --   --   --  1.09*  CALCIUM 8.4* 8.6* 8.6* 8.7*  --   --   --  8.4*  MG  --   --   --  1.7  --   --   --  1.7  PHOS  --   --   --  4.7*  --   --   --  4.6   GFR: Estimated Creatinine Clearance: 64.5 mL/min (A) (by C-G formula based on SCr of 1.09 mg/dL (H)). Liver Function Tests: No results for input(s): "AST", "ALT", "ALKPHOS", "BILITOT", "PROT", "ALBUMIN" in the last 168 hours. No results for input(s): "LIPASE", "AMYLASE" in the last 168 hours. No results for input(s): "AMMONIA" in the last 168 hours. Coagulation Profile: Recent Labs  Lab 08/18/22 1402  INR 1.2   Cardiac Enzymes: No results for input(s): "CKTOTAL", "CKMB", "CKMBINDEX", "TROPONINI" in the last 168 hours. BNP (last 3 results) No results for input(s): "PROBNP" in the last 8760 hours. HbA1C: No results for input(s): "HGBA1C" in the last 72 hours. CBG: Recent Labs  Lab 08/23/22 1128 08/23/22 1733 08/23/22 2112 08/24/22 0752 08/24/22 1222  GLUCAP 132* 156*  140* 112* 125*   Lipid Profile: No results for input(s): "CHOL", "HDL", "LDLCALC", "TRIG", "CHOLHDL", "LDLDIRECT" in the last 72 hours. Thyroid Function Tests: No results for input(s): "TSH", "T4TOTAL", "FREET4", "T3FREE", "THYROIDAB" in the last 72 hours. Anemia Panel: No results for input(s): "VITAMINB12", "FOLATE", "FERRITIN", "TIBC", "IRON", "RETICCTPCT" in the last 72 hours. Sepsis Labs: No results for input(s): "PROCALCITON", "LATICACIDVEN" in the last 168 hours.  No results found for this or any previous visit (from the past 240 hour(s)).   Radiology Studies: CARDIAC CATHETERIZATION  Result Date: 08/23/2022   Ost LAD to Prox LAD lesion is 75% stenosed.   Ost Cx to Prox Cx lesion is 100% stenosed.   1st Diag lesion is 25% stenosed.   Mid RCA lesion is 70% stenosed.   Mid LM to Dist LM lesion is 40% stenosed.   Mid LAD lesion is 70% stenosed.   LV end diastolic pressure is moderately elevated.   Hemodynamic findings consistent with mild pulmonary hypertension.   There is no aortic valve stenosis.   Aortic saturation 91%, PA saturation 60%, mean RA pressure 9 mmHg, PA pressure 42/24, mean PA pressure 32 mmHg, mean pulmonary capillary wedge pressure 24 mmHg, V waves noted to 45 mmHg.  Cardiac output 8.58 L/min, cardiac index 4.26. Severe, calcific three-vessel coronary artery disease.  Severe mitral regurgitation by noninvasive workup.  Significant V waves noted on the wedge tracing.  Will obtain cardiac surgery consult for CABG and mitral valve repair.    Scheduled Meds:  aspirin EC  81 mg Oral Daily   atorvastatin  40 mg Oral Daily   empagliflozin  10 mg Oral Daily   enoxaparin (LOVENOX) injection  40 mg Subcutaneous Q24H   famotidine  20 mg Oral Daily   furosemide  60 mg Intravenous BID   gabapentin  300 mg Oral QHS   insulin aspart  0-15 Units Subcutaneous TID WC   insulin aspart  0-5 Units Subcutaneous QHS   insulin glargine-yfgn  12 Units Subcutaneous QHS  levothyroxine  150  mcg Oral Q0600   metoprolol succinate  12.5 mg Oral Daily   pantoprazole  40 mg Oral Daily   sodium chloride flush  3 mL Intravenous Q12H   sodium chloride flush  3 mL Intravenous Q12H   Continuous Infusions:  sodium chloride       LOS: 6 days    Time spent: 35 Mins    Willeen Niece, MD [facility]   If 7PM-7AM, please contact night-coverage

## 2022-08-25 DIAGNOSIS — I214 Non-ST elevation (NSTEMI) myocardial infarction: Secondary | ICD-10-CM | POA: Diagnosis not present

## 2022-08-25 LAB — GLUCOSE, CAPILLARY
Glucose-Capillary: 137 mg/dL — ABNORMAL HIGH (ref 70–99)
Glucose-Capillary: 156 mg/dL — ABNORMAL HIGH (ref 70–99)
Glucose-Capillary: 127 mg/dL — ABNORMAL HIGH (ref 70–99)
Glucose-Capillary: 187 mg/dL — ABNORMAL HIGH (ref 70–99)

## 2022-08-25 LAB — CBC
HCT: 25.9 % — ABNORMAL LOW (ref 36.0–46.0)
Hemoglobin: 7.8 g/dL — ABNORMAL LOW (ref 12.0–15.0)
MCH: 27.6 pg (ref 26.0–34.0)
MCHC: 30.1 g/dL (ref 30.0–36.0)
MCV: 91.5 fL (ref 80.0–100.0)
Platelets: 327 10*3/uL (ref 150–400)
RBC: 2.83 MIL/uL — ABNORMAL LOW (ref 3.87–5.11)
RDW: 17 % — ABNORMAL HIGH (ref 11.5–15.5)
WBC: 4.9 10*3/uL (ref 4.0–10.5)
nRBC: 0 % (ref 0.0–0.2)

## 2022-08-25 LAB — BASIC METABOLIC PANEL
Anion gap: 11 (ref 5–15)
Anion gap: 12 (ref 5–15)
BUN: 25 mg/dL — ABNORMAL HIGH (ref 6–20)
BUN: 22 mg/dL — ABNORMAL HIGH (ref 6–20)
CO2: 30 mmol/L (ref 22–32)
CO2: 28 mmol/L (ref 22–32)
Calcium: 8.8 mg/dL — ABNORMAL LOW (ref 8.9–10.3)
Calcium: 9 mg/dL (ref 8.9–10.3)
Chloride: 97 mmol/L — ABNORMAL LOW (ref 98–111)
Chloride: 96 mmol/L — ABNORMAL LOW (ref 98–111)
Creatinine, Ser: 1.26 mg/dL — ABNORMAL HIGH (ref 0.44–1.00)
Creatinine, Ser: 1.28 mg/dL — ABNORMAL HIGH (ref 0.44–1.00)
GFR, Estimated: 50 mL/min — ABNORMAL LOW (ref >60)
GFR, Estimated: 49 mL/min — ABNORMAL LOW (ref >60)
Glucose, Bld: 138 mg/dL — ABNORMAL HIGH (ref 70–99)
Glucose, Bld: 132 mg/dL — ABNORMAL HIGH (ref 70–99)
Potassium: 3.8 mmol/L (ref 3.5–5.1)
Potassium: 3.9 mmol/L (ref 3.5–5.1)
Sodium: 138 mmol/L (ref 135–145)
Sodium: 136 mmol/L (ref 135–145)

## 2022-08-25 LAB — PHOSPHORUS: Phosphorus: 5.1 mg/dL — ABNORMAL HIGH (ref 2.5–4.6)

## 2022-08-25 LAB — MAGNESIUM: Magnesium: 1.9 mg/dL (ref 1.7–2.4)

## 2022-08-25 NOTE — Progress Notes (Signed)
Rounding Note    Patient Name: Kayla Schmitt Date of Encounter: 08/25/2022   [facility] Cardiologist: Jodelle Red, MD   Subjective   Patient reports that she feels well overall. Denies chest pain, palpitations, lightheadedness. She is doing well off oxygen when sitting up/awake but did feel short of breath and put oxygen back on overnight.   Inpatient Medications    Scheduled Meds:  aspirin EC  81 mg Oral Daily   atorvastatin  40 mg Oral Daily   empagliflozin  10 mg Oral Daily   enoxaparin (LOVENOX) injection  40 mg Subcutaneous Q24H   famotidine  20 mg Oral Daily   furosemide  60 mg Intravenous BID   gabapentin  300 mg Oral QHS   insulin aspart  0-15 Units Subcutaneous TID WC   insulin aspart  0-5 Units Subcutaneous QHS   insulin glargine-yfgn  12 Units Subcutaneous QHS   levothyroxine  150 mcg Oral Q0600   metoprolol succinate  12.5 mg Oral Daily   pantoprazole  40 mg Oral Daily   sodium chloride flush  3 mL Intravenous Q12H   sodium chloride flush  3 mL Intravenous Q12H   Continuous Infusions:  sodium chloride     PRN Meds: sodium chloride, acetaminophen, ondansetron (ZOFRAN) IV, ondansetron **OR** [DISCONTINUED] ondansetron (ZOFRAN) IV, mouth rinse, polyethylene glycol, sodium chloride flush   Vital Signs    Vitals:   08/24/22 0842 08/24/22 1423 08/24/22 2021 08/25/22 0341  BP: (!) 106/58 (!) 103/59 103/60 109/70  Pulse: 80 73 73 71  Resp: 19 18 14 15   Temp:  98.7 F (37.1 C) 98.4 F (36.9 C) 98.4 F (36.9 C)  TempSrc:  Oral Oral Oral  SpO2: 100% 96% 95% 100%  Weight:    92.8 kg  Height:        Intake/Output Summary (Last 24 hours) at 08/25/2022 0800 Last data filed at 08/24/2022 2149 Gross per 24 hour  Intake 262 ml  Output 1975 ml  Net -1713 ml      08/25/2022    3:41 AM 08/24/2022    3:46 AM 08/23/2022    3:50 AM  Last 3 Weights  Weight (lbs) 204 lb 9.6 oz 205 lb 7.5 oz 214 lb 4.6 oz  Weight (kg) 92.806 kg 93.2 kg  97.2 kg      Telemetry    Sinus rhythm - Personally Reviewed  ECG    No new tracing - Personally Reviewed  Physical Exam   GEN: No acute distress.   Neck: No JVD Cardiac: RRR, 3/6 blowing murmur. Respiratory: bilateral lower lobes diminished GI: Soft, nontender, non-distended  MS: 1+ edema in RLE, trace in LLE. S/p bilateral metatarsal amputations Neuro:  Nonfocal  Psych: Normal affect   Labs    High Sensitivity Troponin:   Recent Labs  Lab 08/18/22 1344 08/18/22 1607 08/19/22 0228  TROPONINIHS 1,096* 1,323* 1,227*     Chemistry Recent Labs  Lab 08/23/22 0252 08/23/22 0920 08/23/22 0926 08/24/22 0309 08/25/22 0235  NA 140   < > 138 136 138  K 4.6   < > 4.4 3.9 3.8  CL 101  --   --  99 97*  CO2 27  --   --  27 30  GLUCOSE 144*  --   --  111* 138*  BUN 26*  --   --  23* 25*  CREATININE 1.21*  --   --  1.09* 1.26*  CALCIUM 8.7*  --   --  8.4* 8.8*  MG 1.7  --   --  1.7 1.9  GFRNONAA 53*  --   --  60* 50*  ANIONGAP 12  --   --  10 11   < > = values in this interval not displayed.    Lipids No results for input(s): "CHOL", "TRIG", "HDL", "LABVLDL", "LDLCALC", "CHOLHDL" in the last 168 hours.  Hematology Recent Labs  Lab 08/23/22 0252 08/23/22 0920 08/23/22 0926 08/24/22 0309 08/25/22 0235  WBC 5.1  --   --  4.6 4.9  RBC 2.61*  --   --  2.74* 2.83*  HGB 7.4*   < > 7.8* 7.7* 7.8*  HCT 23.2*   < > 23.0* 24.4* 25.9*  MCV 88.9  --   --  89.1 91.5  MCH 28.4  --   --  28.1 27.6  MCHC 31.9  --   --  31.6 30.1  RDW 17.4*  --   --  17.1* 17.0*  PLT 291  --   --  298 327   < > = values in this interval not displayed.   Thyroid No results for input(s): "TSH", "FREET4" in the last 168 hours.  BNP Recent Labs  Lab 08/18/22 1344  BNP 486.0*    DDimer No results for input(s): "DDIMER" in the last 168 hours.   Radiology    CARDIAC CATHETERIZATION  Result Date: 08/23/2022   Ost LAD to Prox LAD lesion is 75% stenosed.   Ost Cx to Prox Cx lesion is 100%  stenosed.   1st Diag lesion is 25% stenosed.   Mid RCA lesion is 70% stenosed.   Mid LM to Dist LM lesion is 40% stenosed.   Mid LAD lesion is 70% stenosed.   LV end diastolic pressure is moderately elevated.   Hemodynamic findings consistent with mild pulmonary hypertension.   There is no aortic valve stenosis.   Aortic saturation 91%, PA saturation 60%, mean RA pressure 9 mmHg, PA pressure 42/24, mean PA pressure 32 mmHg, mean pulmonary capillary wedge pressure 24 mmHg, V waves noted to 45 mmHg.  Cardiac output 8.58 L/min, cardiac index 4.26. Severe, calcific three-vessel coronary artery disease.  Severe mitral regurgitation by noninvasive workup.  Significant V waves noted on the wedge tracing.  Will obtain cardiac surgery consult for CABG and mitral valve repair.    Cardiac Studies   Echocardiogram 08/19/22  1. Left ventricular ejection fraction, by estimation, is 40 to 45%. The  left ventricle has mildly decreased function. The left ventricle  demonstrates regional wall motion abnormalities (see scoring  diagram/findings for description). There is mild left  ventricular hypertrophy. Left ventricular diastolic parameters are  indeterminate.   2. Right ventricular systolic function is normal. The right ventricular  size is normal. Tricuspid regurgitation signal is inadequate for assessing  PA pressure.   3. Left atrial size was mildly dilated.   4. A small pericardial effusion is present.   5. The mitral valve is degenerative. Severe mitral valve regurgitation.  No evidence of mitral stenosis.   6. The aortic valve was not well visualized. There is moderate  calcification of the aortic valve. There is moderate thickening of the  aortic valve. Aortic valve regurgitation is trivial. Mild aortic valve  stenosis. Aortic valve mean gradient measures  10.0 mmHg.   7. The inferior vena cava is dilated in size with <50% respiratory  variability, suggesting right atrial pressure of 15 mmHg.     Right/Left Heart Catheterization 08/23/22   Ost LAD to Prox  LAD lesion is 75% stenosed.   Ost Cx to Prox Cx lesion is 100% stenosed.   1st Diag lesion is 25% stenosed.   Mid RCA lesion is 70% stenosed.   Mid LM to Dist LM lesion is 40% stenosed.   Mid LAD lesion is 70% stenosed.   LV end diastolic pressure is moderately elevated.   Hemodynamic findings consistent with mild pulmonary hypertension.   There is no aortic valve stenosis.   Aortic saturation 91%, PA saturation 60%, mean RA pressure 9 mmHg, PA pressure 42/24, mean PA pressure 32 mmHg, mean pulmonary capillary wedge pressure 24 mmHg, V waves noted to 45 mmHg.  Cardiac output 8.58 L/min, cardiac index 4.26.   Severe, calcific three-vessel coronary artery disease.  Severe mitral regurgitation by noninvasive workup.  Significant V waves noted on the wedge tracing.  Will obtain cardiac surgery consult for CABG and mitral valve repair. Diagnostic Dominance: Right   Patient Profile     57 y.o. female with PMH of PAD (s/p multiple interventions and most recent bilateral TMA 08/08/22), HLD, type 2 DM, GERD, hypothyroidism, cardiology is following for CHF, NSTEMI.   Assessment & Plan    Acute Systolic Heart Failure Severe Mitral Valve Regurgitation   Patient presented to [facility] on 4/20 complaining of dyspnea on exertion and orthopnea.  BNP was elevated to 486. hsTn [id number]. CTA chest on 4/20 showed no PE. Noted pulmonary edema with small-moderate layering bilateral pleural effusions, probable compressive atelectasis versus consolidation of lung bases, mild enlarged mediastinal and bilateral hilar lymph nodes, trace pericardial effusion, aortic and coronary artery atherosclerosis.Echocardiogram this admission showed EF 40-45% with regional wall motion abnormalities, mild LVH, normal RV systolic function, small pericardial effusion, severe mitral valve regurgitation (not noted on old TTE), mild aortic valve stenosis   Patient  has been diuresing on IV lasix- currently net -6.8 L. Weight down 17.9 lbs. Dry weight is 200 lbs, she weighs 204 today. Not sure how reliable weights are given that they are bedweight due to patient unable to stand. Creatinine back up slightly today 1.09->1.26. Continues to have trace ankle edema and is still on 2 L supplemental oxygen at night. Would give IV lasix this AM and reassess BMP this afternoon prior to evening lasix. BP remains low, which limits GDMT. Continue metoprolol succinate 12.5 mg, jardiance   NSTEMI  Severe Three-vessel CAD   Troponin this admission 1096>[id number]. Patient underwent Right/Left Heart cath that showed severe, calcific three-vessel CAD. With severe mitral regurgitation, CT surgery was consulted for CABG and mitral valve repair. CT surgery saw patient and per their notes, we could either aggressively managed CHF and allow her to recover from recent amputations, perform TEE as outpatient to assess mitral valve, and schedule CABG in 4-6 weeks. Or, we could wait for plavix to wash out and proceed with inpatient TEE and scheduled CABG/MV surgery end of next week  Patient understandably would prefer to have surgery this admission. However, there may be benefits to waiting a few weeks  Hold plavix Continue metoprolol, ASA, lipitor     HTN   Home amlodipine held to allow titration of GDMT. Continue metoprolol succinate 12.5 mg daily    HLD   Lipid panel 4/5 showed LDL 54. Continue lipitor 40 mg daily    Acute blood loss anemia from recent bilateral metatarsal amputation   Hemoglobin was down to 6.1 on admission. Now patient is S/p 1 unit of PRBC. Fecal Occult Blood was negative on admission. GI planning to  follow as an outpatient   Hemoglobin stable- 7.8 this AM        For questions or updates, please contact [facility] Please consult [email or web address] for contact info under        Signed, Perlie Gold, PA-C  08/25/2022, 8:00 AM   n

## 2022-08-25 NOTE — Progress Notes (Signed)
PROGRESS NOTE    Kayla Schmitt  ZOX:[id number] DOB: Feb 20, 1966 DOA: 08/18/2022  PCP: [facility]   Brief Narrative:  This 57 y.o. female with medical history significant of type 2 diabetes on insulin, hypertension, hypothyroidism, peripheral artery disease on antiplatelet therapy, GERD, chronic anemia.  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  The patient reports having increased bleeding when she went home from the surgical incision sites.  She reports bleeding a fair amount, but did not come back to the hospital for evaluation.  Few days ago, the bleeding stopped and has remained hemostatic for the past several days.  She has been noting increased difficulty breathing and shortness of breath.  She is having difficulty laying down flat.  She was admitted for NSTEMI with systolic heart failure. Cardiology was consulted, Patient underwent LHC found to have Severe, calcific three-vessel coronary artery disease. Severe mitral regurgitation by noninvasive workup. Ctsx is consulted, plan is to have TEE on Monday and discharged until wounds on her feet heals.  She will need CABG and MVR.   Assessment & Plan:   Principal Problem:   NSTEMI (non-ST elevated myocardial infarction) (HCC) Active Problems:   DM type 2 (diabetes mellitus, type 2) (HCC)   Hypertension   Hypothyroidism   Acute clinical systolic heart failure (HCC)   Acute blood loss anemia   Anemia  Acute hypoxic respiratory failure: Acute systolic heart failure in setting of NSTEMI: She was initially started on lasix 40 mg bid, increased to 60 mg IV BID, monitor urine output.  ECHO showed LVEF of 40 to 45%, mildly decreased function, with regional wall motion abnormalities. Diastolic parameters are indeterminate. Severe MVR. Cardiology on board ,Suspect ischemic cardiomyopathy, recommended cardiac cath once euvolemic.  Elevated troponins probably from demand ischemia  from CHF and anemia.  Currently on aspirin and plavix and lipitor.  Patient underwent left heart cath found to have severe, calcific three-vessel coronary artery disease, severe MR. Cardiothoracic surgery consulted for possible CABG and MVR. She is scheduled to have TEE on Monday. Cardiothoracic surgery recommended to hold on CABG and mitral repair until metatarsal wound healed.   Symptomatic anemia: Acute blood loss anemia from recent bilateral metatarsal amputation: Hb 6.1 on Admission > S/p 1 unit of prbc transfusion.  Post transfusion hemoglobin is 7.7, improved to 8.3 and dropped back to 7.5. Stool for occult blood is negative on admission. She denies any melena or use of NSAIDS.  Anemia panel not significant.  Baseline hemoglobin  between 9.8 to 11 in 2020 and 10.1 in 01/2022.  GI consulted, recommended outpatient work up for now.  Transfuse to keep hemoglobin  greater than 7.    Insulin dependent DM : Continue SSI and semglee 12 units at bedtime.  No changes in meds.    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  Wound care as per Dr Lajoyce Corners.   Critical limb ischemia / PVD: 4/4- Left SFA and above knee popliteal artery mechanical thrombectomy, with angioplasty and stenting of left above knee popliteal artery and SFA   4/8- Right SFA and above knee popliteal artery laser atherectomy, with DCBA of the right above knee popliteal artery and SFA.  She was discharged on aspirin and plavix on 4/15 by vascular surgery.  Continue with aspirin and plavix and lipitor.    Hypothyroidism:  Continue synthroid.    Acute Kidney Injury: Looks like her baseline creatinine is less than 1.0,  She admitted with a creatinine of 1.41, improved to 1.26 today.  Continue to monitor. Avoid nephrotoxic medications.   Estimated body mass index is 37.88 kg/m as calculated from the following:   Height as of this encounter: 5\' 4"  (1.626 m).   Weight as  of this encounter: 100.1 kg.   DVT prophylaxis:Lovenox Code Status: Full code Family Communication: No family at bed side. Disposition Plan:    Status is: Inpatient Remains inpatient appropriate because: Admitted for systolic heart failure.  NSTEMI requiring left heart cath once euvolemic. Left heart cath shows calcific three-vessel disease.  Cardiothoracic surgery consulted for CABG and MVR.  Scheduled for TEE on Monday   Consultants:  Cardiology Gastroenterology  Procedures: None  Antimicrobials: Anti-infectives (From admission, onward)    None        Subjective: Patient was seen and examined at bedside.  Overnight events noted.   Patient reports doing much better.  Denies any chest pain. She underwent left heart cath found to have three-vessel disease.   Objective: Vitals:   08/24/22 2021 08/25/22 0341 08/25/22 0809 08/25/22 1032  BP: 103/60 109/70 94/61 104/62  Pulse: 73 71 72 82  Resp: 14 15 17    Temp: 98.4 F (36.9 C) 98.4 F (36.9 C) 98.2 F (36.8 C)   TempSrc: Oral Oral Oral   SpO2: 95% 100% 97%   Weight:  92.8 kg    Height:        Intake/Output Summary (Last 24 hours) at 08/25/2022 1427 Last data filed at 08/24/2022 2149 Gross per 24 hour  Intake 3 ml  Output 650 ml  Net -647 ml   Filed Weights   08/23/22 0350 08/24/22 0346 08/25/22 0341  Weight: 97.2 kg 93.2 kg 92.8 kg    Examination:  General exam: Appears comfortable, deconditioned, not in any acute distress. Respiratory system: CTA bilaterally, respiratory effort normal, RR 13 Cardiovascular system: S1 & S2 heard, regular rate and rhythm, no murmur. Gastrointestinal system: Abdomen is soft, non tender, non distended, BS+ Central nervous system: Alert and oriented x 3. No focal neurological deficits. Extremities: Bilateral transmetatarsal amputation, dressing noted. Skin: No rashes, lesions or ulcers Psychiatry: Judgement and insight appear normal. Mood & affect appropriate.     Data  Reviewed: I have personally reviewed following labs and imaging studies  CBC: Recent Labs  Lab 08/21/22 0321 08/22/22 0213 08/23/22 0252 08/23/22 0920 08/23/22 0925 08/23/22 0926 08/24/22 0309 08/25/22 0235  WBC 6.6 5.8 5.1  --   --   --  4.6 4.9  HGB 7.5* 7.5* 7.4* 7.8* 8.2* 7.8* 7.7* 7.8*  HCT 24.1* 24.6* 23.2* 23.0* 24.0* 23.0* 24.4* 25.9*  MCV 89.9 91.8 88.9  --   --   --  89.1 91.5  PLT 305 301 291  --   --   --  298 327   Basic Metabolic Panel: Recent Labs  Lab 08/21/22 0321 08/22/22 0213 08/23/22 0252 08/23/22 0920 08/23/22 0925 08/23/22 0926 08/24/22 0309 08/25/22 0235  NA 136 137 140 138 138 138 136 138  K 4.7 4.7 4.6 4.4 4.4 4.4 3.9 3.8  CL 106 104 101  --   --   --  99 97*  CO2 24 27 27   --   --   --  27 30  GLUCOSE 145* 125* 144*  --   --   --  111* 138*  BUN 34* 28* 26*  --   --   --  23* 25*  CREATININE 1.29* 1.17* 1.21*  --   --   --  1.09* 1.26*  CALCIUM 8.6* 8.6* 8.7*  --   --   --  8.4* 8.8*  MG  --   --  1.7  --   --   --  1.7 1.9  PHOS  --   --  4.7*  --   --   --  4.6 5.1*   GFR: Estimated Creatinine Clearance: 55.7 mL/min (A) (by C-G formula based on SCr of 1.26 mg/dL (H)). Liver Function Tests: No results for input(s): "AST", "ALT", "ALKPHOS", "BILITOT", "PROT", "ALBUMIN" in the last 168 hours. No results for input(s): "LIPASE", "AMYLASE" in the last 168 hours. No results for input(s): "AMMONIA" in the last 168 hours. Coagulation Profile: No results for input(s): "INR", "PROTIME" in the last 168 hours.  Cardiac Enzymes: No results for input(s): "CKTOTAL", "CKMB", "CKMBINDEX", "TROPONINI" in the last 168 hours. BNP (last 3 results) No results for input(s): "PROBNP" in the last 8760 hours. HbA1C: No results for input(s): "HGBA1C" in the last 72 hours. CBG: Recent Labs  Lab 08/24/22 1222 08/24/22 1615 08/24/22 2143 08/25/22 0807 08/25/22 1154  GLUCAP 125* 132* 176* 137* 156*   Lipid Profile: No results for input(s): "CHOL", "HDL",  "LDLCALC", "TRIG", "CHOLHDL", "LDLDIRECT" in the last 72 hours. Thyroid Function Tests: No results for input(s): "TSH", "T4TOTAL", "FREET4", "T3FREE", "THYROIDAB" in the last 72 hours. Anemia Panel: No results for input(s): "VITAMINB12", "FOLATE", "FERRITIN", "TIBC", "IRON", "RETICCTPCT" in the last 72 hours. Sepsis Labs: No results for input(s): "PROCALCITON", "LATICACIDVEN" in the last 168 hours.  No results found for this or any previous visit (from the past 240 hour(s)).   Radiology Studies: No results found.  Scheduled Meds:  aspirin EC  81 mg Oral Daily   atorvastatin  40 mg Oral Daily   empagliflozin  10 mg Oral Daily   enoxaparin (LOVENOX) injection  40 mg Subcutaneous Q24H   famotidine  20 mg Oral Daily   furosemide  60 mg Intravenous BID   gabapentin  300 mg Oral QHS   insulin aspart  0-15 Units Subcutaneous TID WC   insulin aspart  0-5 Units Subcutaneous QHS   insulin glargine-yfgn  12 Units Subcutaneous QHS   levothyroxine  150 mcg Oral Q0600   metoprolol succinate  12.5 mg Oral Daily   pantoprazole  40 mg Oral Daily   sodium chloride flush  3 mL Intravenous Q12H   sodium chloride flush  3 mL Intravenous Q12H   Continuous Infusions:  sodium chloride       LOS: 7 days    Time spent: 35 Mins    Willeen Niece, MD [facility]   If 7PM-7AM, please contact night-coverage

## 2022-08-26 DIAGNOSIS — I340 Nonrheumatic mitral (valve) insufficiency: Secondary | ICD-10-CM

## 2022-08-26 DIAGNOSIS — I214 Non-ST elevation (NSTEMI) myocardial infarction: Secondary | ICD-10-CM | POA: Diagnosis not present

## 2022-08-26 LAB — GLUCOSE, CAPILLARY
Glucose-Capillary: 172 mg/dL — ABNORMAL HIGH (ref 70–99)
Glucose-Capillary: 138 mg/dL — ABNORMAL HIGH (ref 70–99)
Glucose-Capillary: 147 mg/dL — ABNORMAL HIGH (ref 70–99)
Glucose-Capillary: 213 mg/dL — ABNORMAL HIGH (ref 70–99)

## 2022-08-26 LAB — LIPOPROTEIN A (LPA): Lipoprotein (a): 79.5 nmol/L — ABNORMAL HIGH (ref <75.0)

## 2022-08-26 MED ORDER — SODIUM CHLORIDE 0.9 % IV SOLN: INTRAVENOUS | Status: DC

## 2022-08-26 NOTE — Anesthesia Preprocedure Evaluation (Signed)
Anesthesia Evaluation  Patient identified by MRN, date of birth, ID band Patient awake    Reviewed: Allergy & Precautions, NPO status , Patient's Chart, lab work & pertinent test results  History of Anesthesia Complications (+) PONV and history of anesthetic complications  Airway Mallampati: III  TM Distance: >3 FB Neck ROM: Full    Dental  (+) Edentulous Upper, Upper Dentures   Pulmonary former smoker   Pulmonary exam normal breath sounds clear to auscultation       Cardiovascular hypertension, Pt. on medications + Past MI and + Peripheral Vascular Disease  Normal cardiovascular exam Rhythm:Regular Rate:Normal  TTE 2024 1. Left ventricular ejection fraction, by estimation, is 40 to 45%. The  left ventricle has mildly decreased function. The left ventricle  demonstrates regional wall motion abnormalities (see scoring  diagram/findings for description). There is mild left  ventricular hypertrophy. Left ventricular diastolic parameters are  indeterminate.   2. Right ventricular systolic function is normal. The right ventricular  size is normal. Tricuspid regurgitation signal is inadequate for assessing  PA pressure.   3. Left atrial size was mildly dilated.   4. A small pericardial effusion is present.   5. The mitral valve is degenerative. Severe mitral valve regurgitation.  No evidence of mitral stenosis.   6. The aortic valve was not well visualized. There is moderate  calcification of the aortic valve. There is moderate thickening of the  aortic valve. Aortic valve regurgitation is trivial. Mild aortic valve  stenosis. Aortic valve mean gradient measures  10.0 mmHg.   7. The inferior vena cava is dilated in size with <50% respiratory  variability, suggesting right atrial pressure of 15 mmHg.   Cath 2024   Ost LAD to Prox LAD lesion is 75% stenosed.   Ost Cx to Prox Cx lesion is 100% stenosed.   1st Diag lesion is  25% stenosed.   Mid RCA lesion is 70% stenosed.   Mid LM to Dist LM lesion is 40% stenosed.   Mid LAD lesion is 70% stenosed.   LV end diastolic pressure is moderately elevated.   Hemodynamic findings consistent with mild pulmonary hypertension.   There is no aortic valve stenosis.   Aortic saturation 91%, PA saturation 60%, mean RA pressure 9 mmHg, PA pressure 42/24, mean PA pressure 32 mmHg, mean pulmonary capillary wedge pressure 24 mmHg, V waves noted to 45 mmHg.  Cardiac output 8.58 L/min, cardiac index 4.26.      Neuro/Psych negative neurological ROS  negative psych ROS   GI/Hepatic Neg liver ROS,GERD  ,,  Endo/Other  diabetes, Type 2, Insulin DependentHypothyroidism    Renal/GU Renal InsufficiencyRenal disease  negative genitourinary   Musculoskeletal  (+) Arthritis ,    Abdominal   Peds  Hematology  (+) Blood dyscrasia (plavix), anemia Lab Results      Component                Value               Date                      WBC                      4.9                 08/25/2022                HGB  7.8 (L)             08/25/2022                HCT                      25.9 (L)            08/25/2022                MCV                      91.5                08/25/2022                PLT                      327                 08/25/2022              Anesthesia Other Findings Hgb 7.8?  57 y.o. female with medical history significant of type 2 diabetes on insulin, hypertension, hypothyroidism, peripheral artery disease on antiplatelet therapy, GERD, chronic anemia.  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 Now presents with difficulty breathing and shortness of breath found to have severe, calcific three-vessel coronary artery disease ans severe mitral regurgitation   Reproductive/Obstetrics                             Anesthesia Physical Anesthesia Plan  ASA:  4  Anesthesia Plan: MAC   Post-op Pain Management: Minimal or no pain anticipated   Induction: Intravenous  PONV Risk Score and Plan: 3 and Propofol infusion and Treatment may vary due to age or medical condition  Airway Management Planned: Natural Airway  Additional Equipment:   Intra-op Plan:   Post-operative Plan:   Informed Consent: I have reviewed the patients History and Physical, chart, labs and discussed the procedure including the risks, benefits and alternatives for the proposed anesthesia with the patient or authorized representative who has indicated his/her understanding and acceptance.     Dental advisory given  Plan Discussed with: CRNA  Anesthesia Plan Comments:        Anesthesia Quick Evaluation

## 2022-08-26 NOTE — Progress Notes (Signed)
Rounding Note    Patient Name: Kayla Schmitt Date of Encounter: 08/26/2022  [facility] Cardiologist: Jodelle Red, MD   Subjective   Off oxygen no dyspnea or chest pain for TEE tomorrow with Dr Shari Prows  Inpatient Medications    Scheduled Meds:  aspirin EC  81 mg Oral Daily   atorvastatin  40 mg Oral Daily   empagliflozin  10 mg Oral Daily   enoxaparin (LOVENOX) injection  40 mg Subcutaneous Q24H   famotidine  20 mg Oral Daily   furosemide  60 mg Intravenous BID   gabapentin  300 mg Oral QHS   insulin aspart  0-15 Units Subcutaneous TID WC   insulin aspart  0-5 Units Subcutaneous QHS   insulin glargine-yfgn  12 Units Subcutaneous QHS   levothyroxine  150 mcg Oral Q0600   metoprolol succinate  12.5 mg Oral Daily   pantoprazole  40 mg Oral Daily   sodium chloride flush  3 mL Intravenous Q12H   sodium chloride flush  3 mL Intravenous Q12H   Continuous Infusions:  sodium chloride     PRN Meds: sodium chloride, acetaminophen, ondansetron (ZOFRAN) IV, ondansetron **OR** [DISCONTINUED] ondansetron (ZOFRAN) IV, mouth rinse, polyethylene glycol, sodium chloride flush   Vital Signs    Vitals:   08/25/22 1500 08/25/22 2007 08/26/22 0501 08/26/22 0832  BP: 108/62 103/60 118/61 (!) 104/55  Pulse: 73 73 72 76  Resp: 19 16 14 17   Temp:  98.4 F (36.9 C) 98.5 F (36.9 C) 98.5 F (36.9 C)  TempSrc:  Oral Oral Oral  SpO2: 96% 96% 97% 100%  Weight:   91.8 kg   Height:        Intake/Output Summary (Last 24 hours) at 08/26/2022 0920 Last data filed at 08/26/2022 0616 Gross per 24 hour  Intake 483 ml  Output 500 ml  Net -17 ml      08/26/2022    5:01 AM 08/25/2022    3:41 AM 08/24/2022    3:46 AM  Last 3 Weights  Weight (lbs) 202 lb 6.1 oz 204 lb 9.6 oz 205 lb 7.5 oz  Weight (kg) 91.8 kg 92.806 kg 93.2 kg      Telemetry    Sinus rhythm - Personally Reviewed  ECG    No new tracing - Personally Reviewed  Physical Exam   GEN: No  acute distress.   Neck: No JVD Cardiac: RRR, 3/6 blowing murmur. Respiratory: bilateral lower lobes diminished GI: Soft, nontender, non-distended  MS: 1+ edema in RLE, trace in LLE. S/p bilateral metatarsal amputations Neuro:  Nonfocal  Psych: Normal affect   Labs    High Sensitivity Troponin:   Recent Labs  Lab 08/18/22 1344 08/18/22 1607 08/19/22 0228  TROPONINIHS 1,096* 1,323* 1,227*     Chemistry Recent Labs  Lab 08/23/22 0252 08/23/22 0920 08/24/22 0309 08/25/22 0235 08/25/22 1424  NA 140   < > 136 138 136  K 4.6   < > 3.9 3.8 3.9  CL 101  --  99 97* 96*  CO2 27  --  27 30 28   GLUCOSE 144*  --  111* 138* 132*  BUN 26*  --  23* 25* 22*  CREATININE 1.21*  --  1.09* 1.26* 1.28*  CALCIUM 8.7*  --  8.4* 8.8* 9.0  MG 1.7  --  1.7 1.9  --   GFRNONAA 53*  --  60* 50* 49*  ANIONGAP 12  --  10 11 12    < > =  values in this interval not displayed.    Lipids No results for input(s): "CHOL", "TRIG", "HDL", "LABVLDL", "LDLCALC", "CHOLHDL" in the last 168 hours.  Hematology Recent Labs  Lab 08/23/22 0252 08/23/22 0920 08/23/22 0926 08/24/22 0309 08/25/22 0235  WBC 5.1  --   --  4.6 4.9  RBC 2.61*  --   --  2.74* 2.83*  HGB 7.4*   < > 7.8* 7.7* 7.8*  HCT 23.2*   < > 23.0* 24.4* 25.9*  MCV 88.9  --   --  89.1 91.5  MCH 28.4  --   --  28.1 27.6  MCHC 31.9  --   --  31.6 30.1  RDW 17.4*  --   --  17.1* 17.0*  PLT 291  --   --  298 327   < > = values in this interval not displayed.   Thyroid No results for input(s): "TSH", "FREET4" in the last 168 hours.  BNP No results for input(s): "BNP", "PROBNP" in the last 168 hours.   DDimer No results for input(s): "DDIMER" in the last 168 hours.   Radiology    No results found.  Cardiac Studies   Echocardiogram 08/19/22  1. Left ventricular ejection fraction, by estimation, is 40 to 45%. The  left ventricle has mildly decreased function. The left ventricle  demonstrates regional wall motion abnormalities (see scoring   diagram/findings for description). There is mild left  ventricular hypertrophy. Left ventricular diastolic parameters are  indeterminate.   2. Right ventricular systolic function is normal. The right ventricular  size is normal. Tricuspid regurgitation signal is inadequate for assessing  PA pressure.   3. Left atrial size was mildly dilated.   4. A small pericardial effusion is present.   5. The mitral valve is degenerative. Severe mitral valve regurgitation.  No evidence of mitral stenosis.   6. The aortic valve was not well visualized. There is moderate  calcification of the aortic valve. There is moderate thickening of the  aortic valve. Aortic valve regurgitation is trivial. Mild aortic valve  stenosis. Aortic valve mean gradient measures  10.0 mmHg.   7. The inferior vena cava is dilated in size with <50% respiratory  variability, suggesting right atrial pressure of 15 mmHg.    Right/Left Heart Catheterization 08/23/22   Ost LAD to Prox LAD lesion is 75% stenosed.   Ost Cx to Prox Cx lesion is 100% stenosed.   1st Diag lesion is 25% stenosed.   Mid RCA lesion is 70% stenosed.   Mid LM to Dist LM lesion is 40% stenosed.   Mid LAD lesion is 70% stenosed.   LV end diastolic pressure is moderately elevated.   Hemodynamic findings consistent with mild pulmonary hypertension.   There is no aortic valve stenosis.   Aortic saturation 91%, PA saturation 60%, mean RA pressure 9 mmHg, PA pressure 42/24, mean PA pressure 32 mmHg, mean pulmonary capillary wedge pressure 24 mmHg, V waves noted to 45 mmHg.  Cardiac output 8.58 L/min, cardiac index 4.26.   Severe, calcific three-vessel coronary artery disease.  Severe mitral regurgitation by noninvasive workup.  Significant V waves noted on the wedge tracing.  Will obtain cardiac surgery consult for CABG and mitral valve repair. Diagnostic Dominance: Right   Patient Profile     57 y.o. female with PMH of PAD (s/p multiple interventions and  most recent bilateral TMA 08/08/22), HLD, type 2 DM, GERD, hypothyroidism, cardiology is following for CHF, NSTEMI.   Assessment & Plan  Acute Systolic Heart Failure Severe Mitral Valve Regurgitation   Patient presented to [facility] ER on 4/20 complaining of dyspnea on exertion and orthopnea.  BNP was elevated to 486. hsTn [phone]. CTA chest on 4/20 showed no PE. Noted pulmonary edema with small-moderate layering bilateral pleural effusions, probable compressive atelectasis versus consolidation of lung bases, mild enlarged mediastinal and bilateral hilar lymph nodes, trace pericardial effusion, aortic and coronary artery atherosclerosis.Echocardiogram this admission showed EF 40-45% with regional wall motion abnormalities, mild LVH, normal RV systolic function, small pericardial effusion, severe mitral valve regurgitation (not noted on old TTE), mild aortic valve stenosis   Good diuresis off oxygen  BP remains low, which limits GDMT. Continue metoprolol succinate 12.5 mg, jardiance   NSTEMI  Severe Three-vessel CAD   Troponin this admission 1096>1323>1227. Patient underwent Right/Left Heart cath that showed severe, calcific three-vessel CAD. With severe mitral regurgitation, CT surgery was consulted for CABG and mitral valve repair. CT surgery saw patient and per their notes, we could either aggressively managed CHF and allow her to recover from recent amputations, perform TEE tomorrow   Patient understandably would prefer to have surgery this admission. However, there may be benefits to waiting a few weeks  Hold plavix Continue metoprolol, ASA, lipitor   Possible d/c after TEE tomorrow and elective surgery CABG /MV in a few weeks    HTN   Home amlodipine held to allow titration of GDMT. Continue metoprolol succinate 12.5 mg daily    HLD   Lipid panel 4/5 showed LDL 54. Continue lipitor 40 mg daily    Acute blood loss anemia from recent bilateral metatarsal amputation    Hemoglobin stable- 7.8 this AM        For questions or updates, please contact [facility] Please consult [email or web address] for contact info under        Signed, Charlton Haws, MD  08/26/2022, 9:20 AM   n

## 2022-08-26 NOTE — Progress Notes (Signed)
    [facility] has been requested to perform a transesophageal echocardiogram on Merrilyn Puma for evaluation of severe mitral valve regurgitation.  After careful review of history and examination, the risks and benefits of transesophageal echocardiogram have been explained including risks of esophageal damage, perforation (1:10,000 risk), bleeding, pharyngeal hematoma as well as other potential complications associated with conscious sedation including aspiration, arrhythmia, respiratory failure and death. Alternatives to treatment were discussed, questions were answered. Patient is willing to proceed.   Perlie Gold PA-C 08/26/2022 11:37 AM

## 2022-08-26 NOTE — Progress Notes (Signed)
PROGRESS NOTE    Kayla Schmitt  ZOX:[id number] DOB: [birth year] DOA: 08/18/2022  PCP: [facility]   Brief Narrative:  This 57 y.o. female with medical history significant of type 2 diabetes on insulin, hypertension, hypothyroidism, peripheral artery disease on antiplatelet therapy, GERD, chronic anemia.  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  The patient reports having increased bleeding when she went home from the surgical incision sites.  She reports bleeding a fair amount, but did not come back to the hospital for evaluation.  Few days ago, the bleeding stopped and has remained hemostatic for the past several days.  She has been noting increased difficulty breathing and shortness of breath.  She is having difficulty laying down flat.  She was admitted for NSTEMI with systolic heart failure. Cardiology was consulted, Patient underwent LHC found to have Severe, calcific three-vessel coronary artery disease. Severe mitral regurgitation by noninvasive workup. Ctsx is consulted, plan is to have TEE on Monday and discharged until wounds on her feet heals.  She will need CABG and MVR.   Assessment & Plan:   Principal Problem:   NSTEMI (non-ST elevated myocardial infarction) (HCC) Active Problems:   DM type 2 (diabetes mellitus, type 2) (HCC)   Hypertension   Hypothyroidism   Acute clinical systolic heart failure (HCC)   Acute blood loss anemia   Anemia  Acute hypoxic respiratory failure: Acute systolic heart failure in setting of NSTEMI: She was initially started on lasix 40 mg bid, increased to 60 mg IV BID, monitor urine output.  ECHO showed LVEF of 40 to 45%, mildly decreased function, with regional wall motion abnormalities. Diastolic parameters are indeterminate. Severe MVR. Cardiology on board ,Suspect ischemic cardiomyopathy, recommended cardiac cath once euvolemic.  Elevated troponins probably from demand ischemia  from CHF and anemia.  Currently on aspirin and plavix and lipitor.  Patient underwent left heart cath found to have severe, calcific three-vessel coronary artery disease, severe MR. Cardiothoracic surgery consulted for possible CABG and MVR. She is scheduled to have TEE on Monday. Cardiothoracic surgery recommended to hold on CABG and mitral repair until metatarsal wounds healed.   Symptomatic anemia: Acute blood loss anemia from recent bilateral metatarsal amputation: Hb 6.1 on Admission > S/p 1 unit of prbc transfusion.  Post transfusion hemoglobin is 7.7, improved to 8.3 and dropped back to 7.5. Stool for occult blood is negative on admission. She denies any melena or use of NSAIDS.  Anemia panel not significant.  Baseline hemoglobin  between 9.8 to 11 in 2020 and 10.1 in 01/2022.  GI consulted, recommended outpatient work up for now.  Transfuse to keep hemoglobin  greater than 7.    Insulin dependent DM : Continue SSI and semglee 12 units at bedtime.  No changes in meds.    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  Wound care as per Dr Lajoyce Corners.   Critical limb ischemia / PVD: 4/4- Left SFA and above knee popliteal artery mechanical thrombectomy, with angioplasty and stenting of left above knee popliteal artery and SFA   4/8- Right SFA and above knee popliteal artery laser atherectomy, with DCBA of the right above knee popliteal artery and SFA.  She was discharged on aspirin and plavix on 4/15 by vascular surgery.  Continue with aspirin and plavix and lipitor.    Hypothyroidism:  Continue synthroid.    Acute Kidney Injury: Looks like her baseline creatinine is less than 1.0,  She admitted with a creatinine of 1.41, improved to 1.28 today.  Continue to monitor. Avoid nephrotoxic medications.   Estimated body mass index is 37.88 kg/m as calculated from the following:   Height as of this encounter: 5\' 4"  (1.626 m).   Weight as  of this encounter: 100.1 kg.   DVT prophylaxis:Lovenox Code Status: Full code Family Communication: No family at bed side. Disposition Plan:    Status is: Inpatient Remains inpatient appropriate because: Admitted for systolic heart failure.  NSTEMI requiring left heart cath once euvolemic. Left heart cath shows calcific three-vessel disease.  Cardiothoracic surgery consulted for CABG and MVR.  Scheduled for TEE on Monday   Consultants:  Cardiology Gastroenterology  Procedures: None  Antimicrobials: Anti-infectives (From admission, onward)    None        Subjective: Patient was seen and examined at bedside.  Overnight events noted.   Patient report doing much better, denies any chest pain. She underwent left heart cath found to have three-vessel disease.   Objective: Vitals:   08/25/22 2007 08/26/22 0501 08/26/22 0832 08/26/22 1021  BP: 103/60 118/61 (!) 104/55 (!) 118/55  Pulse: 73 72 76 79  Resp: 16 14 17 16   Temp: 98.4 F (36.9 C) 98.5 F (36.9 C) 98.5 F (36.9 C) 97.9 F (36.6 C)  TempSrc: Oral Oral Oral Oral  SpO2: 96% 97% 100% 98%  Weight:  91.8 kg    Height:        Intake/Output Summary (Last 24 hours) at 08/26/2022 1230 Last data filed at 08/26/2022 1035 Gross per 24 hour  Intake 799 ml  Output 1150 ml  Net -351 ml   Filed Weights   08/24/22 0346 08/25/22 0341 08/26/22 0501  Weight: 93.2 kg 92.8 kg 91.8 kg    Examination:  General exam: Appears comfortable, not in any acute distress. Respiratory system: CTA bilaterally, respiratory effort normal, RR 14 Cardiovascular system: S1 & S2 heard, regular rate and rhythm, no murmur. Gastrointestinal system: Abdomen is soft, non tender, non distended, BS+ Central nervous system: Alert and oriented x 3. No focal neurological deficits. Extremities: Bilateral transmetatarsal amputation, dressing noted. Skin: No rashes, lesions or ulcers Psychiatry: Judgement and insight appear normal. Mood & affect  appropriate.     Data Reviewed: I have personally reviewed following labs and imaging studies  CBC: Recent Labs  Lab 08/21/22 0321 08/22/22 0213 08/23/22 0252 08/23/22 0920 08/23/22 0925 08/23/22 0926 08/24/22 0309 08/25/22 0235  WBC 6.6 5.8 5.1  --   --   --  4.6 4.9  HGB 7.5* 7.5* 7.4* 7.8* 8.2* 7.8* 7.7* 7.8*  HCT 24.1* 24.6* 23.2* 23.0* 24.0* 23.0* 24.4* 25.9*  MCV 89.9 91.8 88.9  --   --   --  89.1 91.5  PLT 305 301 291  --   --   --  298 327   Basic Metabolic Panel: Recent Labs  Lab 08/22/22 0213 08/23/22 0252 08/23/22 0920 08/23/22 0925 08/23/22 0926 08/24/22 0309 08/25/22 0235 08/25/22 1424  NA 137 140   < > 138 138 136 138 136  K 4.7 4.6   < > 4.4 4.4 3.9 3.8 3.9  CL 104 101  --   --   --  99 97* 96*  CO2 27 27  --   --   --  27 30 28   GLUCOSE 125* 144*  --   --   --  111* 138* 132*  BUN 28* 26*  --   --   --  23* 25* 22*  CREATININE 1.17* 1.21*  --   --   --  1.09* 1.26* 1.28*  CALCIUM 8.6* 8.7*  --   --   --  8.4* 8.8* 9.0  MG  --  1.7  --   --   --  1.7 1.9  --   PHOS  --  4.7*  --   --   --  4.6 5.1*  --    < > = values in this interval not displayed.   GFR: Estimated Creatinine Clearance: 54.5 mL/min (A) (by C-G formula based on SCr of 1.28 mg/dL (H)). Liver Function Tests: No results for input(s): "AST", "ALT", "ALKPHOS", "BILITOT", "PROT", "ALBUMIN" in the last 168 hours. No results for input(s): "LIPASE", "AMYLASE" in the last 168 hours. No results for input(s): "AMMONIA" in the last 168 hours. Coagulation Profile: No results for input(s): "INR", "PROTIME" in the last 168 hours.  Cardiac Enzymes: No results for input(s): "CKTOTAL", "CKMB", "CKMBINDEX", "TROPONINI" in the last 168 hours. BNP (last 3 results) No results for input(s): "PROBNP" in the last 8760 hours. HbA1C: No results for input(s): "HGBA1C" in the last 72 hours. CBG: Recent Labs  Lab 08/25/22 0807 08/25/22 1154 08/25/22 1608 08/25/22 2159 08/26/22 0830  GLUCAP 137*  156* 127* 187* 172*   Lipid Profile: No results for input(s): "CHOL", "HDL", "LDLCALC", "TRIG", "CHOLHDL", "LDLDIRECT" in the last 72 hours. Thyroid Function Tests: No results for input(s): "TSH", "T4TOTAL", "FREET4", "T3FREE", "THYROIDAB" in the last 72 hours. Anemia Panel: No results for input(s): "VITAMINB12", "FOLATE", "FERRITIN", "TIBC", "IRON", "RETICCTPCT" in the last 72 hours. Sepsis Labs: No results for input(s): "PROCALCITON", "LATICACIDVEN" in the last 168 hours.  No results found for this or any previous visit (from the past 240 hour(s)).   Radiology Studies: No results found.  Scheduled Meds:  aspirin EC  81 mg Oral Daily   atorvastatin  40 mg Oral Daily   empagliflozin  10 mg Oral Daily   enoxaparin (LOVENOX) injection  40 mg Subcutaneous Q24H   famotidine  20 mg Oral Daily   furosemide  60 mg Intravenous BID   gabapentin  300 mg Oral QHS   insulin aspart  0-15 Units Subcutaneous TID WC   insulin aspart  0-5 Units Subcutaneous QHS   insulin glargine-yfgn  12 Units Subcutaneous QHS   levothyroxine  150 mcg Oral Q0600   metoprolol succinate  12.5 mg Oral Daily   pantoprazole  40 mg Oral Daily   sodium chloride flush  3 mL Intravenous Q12H   sodium chloride flush  3 mL Intravenous Q12H   Continuous Infusions:  sodium chloride       LOS: 8 days    Time spent: 35 Mins    Willeen Niece, MD [facility]   If 7PM-7AM, please contact night-coverage

## 2022-08-26 NOTE — H&P (View-Only) (Signed)
 Rounding Note    Patient Name: Kayla Schmitt Date of Encounter: 08/26/2022  [facility] Cardiologist: Bridgette Christopher, MD   Subjective   Off oxygen no dyspnea or chest pain for TEE tomorrow with Dr Pemberton  Inpatient Medications    Scheduled Meds:  aspirin EC  81 mg Oral Daily   atorvastatin  40 mg Oral Daily   empagliflozin  10 mg Oral Daily   enoxaparin (LOVENOX) injection  40 mg Subcutaneous Q24H   famotidine  20 mg Oral Daily   furosemide  60 mg Intravenous BID   gabapentin  300 mg Oral QHS   insulin aspart  0-15 Units Subcutaneous TID WC   insulin aspart  0-5 Units Subcutaneous QHS   insulin glargine-yfgn  12 Units Subcutaneous QHS   levothyroxine  150 mcg Oral Q0600   metoprolol succinate  12.5 mg Oral Daily   pantoprazole  40 mg Oral Daily   sodium chloride flush  3 mL Intravenous Q12H   sodium chloride flush  3 mL Intravenous Q12H   Continuous Infusions:  sodium chloride     PRN Meds: sodium chloride, acetaminophen, ondansetron (ZOFRAN) IV, ondansetron **OR** [DISCONTINUED] ondansetron (ZOFRAN) IV, mouth rinse, polyethylene glycol, sodium chloride flush   Vital Signs    Vitals:   08/25/22 1500 08/25/22 2007 08/26/22 0501 08/26/22 0832  BP: 108/62 103/60 118/61 (!) 104/55  Pulse: 73 73 72 76  Resp: 19 16 14 17  Temp:  98.4 F (36.9 C) 98.5 F (36.9 C) 98.5 F (36.9 C)  TempSrc:  Oral Oral Oral  SpO2: 96% 96% 97% 100%  Weight:   91.8 kg   Height:        Intake/Output Summary (Last 24 hours) at 08/26/2022 0920 Last data filed at 08/26/2022 0616 Gross per 24 hour  Intake 483 ml  Output 500 ml  Net -17 ml      08/26/2022    5:01 AM 08/25/2022    3:41 AM 08/24/2022    3:46 AM  Last 3 Weights  Weight (lbs) 202 lb 6.1 oz 204 lb 9.6 oz 205 lb 7.5 oz  Weight (kg) 91.8 kg 92.806 kg 93.2 kg      Telemetry    Sinus rhythm - Personally Reviewed  ECG    No new tracing - Personally Reviewed  Physical Exam   GEN: No  acute distress.   Neck: No JVD Cardiac: RRR, 3/6 blowing murmur. Respiratory: bilateral lower lobes diminished GI: Soft, nontender, non-distended  MS: 1+ edema in RLE, trace in LLE. S/p bilateral metatarsal amputations Neuro:  Nonfocal  Psych: Normal affect   Labs    High Sensitivity Troponin:   Recent Labs  Lab 08/18/22 1344 08/18/22 1607 08/19/22 0228  TROPONINIHS 1,096* 1,323* 1,227*     Chemistry Recent Labs  Lab 08/23/22 0252 08/23/22 0920 08/24/22 0309 08/25/22 0235 08/25/22 1424  NA 140   < > 136 138 136  K 4.6   < > 3.9 3.8 3.9  CL 101  --  99 97* 96*  CO2 27  --  27 30 28  GLUCOSE 144*  --  111* 138* 132*  BUN 26*  --  23* 25* 22*  CREATININE 1.21*  --  1.09* 1.26* 1.28*  CALCIUM 8.7*  --  8.4* 8.8* 9.0  MG 1.7  --  1.7 1.9  --   GFRNONAA 53*  --  60* 50* 49*  ANIONGAP 12  --  10 11 12   < > =   values in this interval not displayed.    Lipids No results for input(s): "CHOL", "TRIG", "HDL", "LABVLDL", "LDLCALC", "CHOLHDL" in the last 168 hours.  Hematology Recent Labs  Lab 08/23/22 0252 08/23/22 0920 08/23/22 0926 08/24/22 0309 08/25/22 0235  WBC 5.1  --   --  4.6 4.9  RBC 2.61*  --   --  2.74* 2.83*  HGB 7.4*   < > 7.8* 7.7* 7.8*  HCT 23.2*   < > 23.0* 24.4* 25.9*  MCV 88.9  --   --  89.1 91.5  MCH 28.4  --   --  28.1 27.6  MCHC 31.9  --   --  31.6 30.1  RDW 17.4*  --   --  17.1* 17.0*  PLT 291  --   --  298 327   < > = values in this interval not displayed.   Thyroid No results for input(s): "TSH", "FREET4" in the last 168 hours.  BNP No results for input(s): "BNP", "PROBNP" in the last 168 hours.   DDimer No results for input(s): "DDIMER" in the last 168 hours.   Radiology    No results found.  Cardiac Studies   Echocardiogram 08/19/22  1. Left ventricular ejection fraction, by estimation, is 40 to 45%. The  left ventricle has mildly decreased function. The left ventricle  demonstrates regional wall motion abnormalities (see scoring   diagram/findings for description). There is mild left  ventricular hypertrophy. Left ventricular diastolic parameters are  indeterminate.   2. Right ventricular systolic function is normal. The right ventricular  size is normal. Tricuspid regurgitation signal is inadequate for assessing  PA pressure.   3. Left atrial size was mildly dilated.   4. A small pericardial effusion is present.   5. The mitral valve is degenerative. Severe mitral valve regurgitation.  No evidence of mitral stenosis.   6. The aortic valve was not well visualized. There is moderate  calcification of the aortic valve. There is moderate thickening of the  aortic valve. Aortic valve regurgitation is trivial. Mild aortic valve  stenosis. Aortic valve mean gradient measures  10.0 mmHg.   7. The inferior vena cava is dilated in size with <50% respiratory  variability, suggesting right atrial pressure of 15 mmHg.    Right/Left Heart Catheterization 08/23/22   Ost LAD to Prox LAD lesion is 75% stenosed.   Ost Cx to Prox Cx lesion is 100% stenosed.   1st Diag lesion is 25% stenosed.   Mid RCA lesion is 70% stenosed.   Mid LM to Dist LM lesion is 40% stenosed.   Mid LAD lesion is 70% stenosed.   LV end diastolic pressure is moderately elevated.   Hemodynamic findings consistent with mild pulmonary hypertension.   There is no aortic valve stenosis.   Aortic saturation 91%, PA saturation 60%, mean RA pressure 9 mmHg, PA pressure 42/24, mean PA pressure 32 mmHg, mean pulmonary capillary wedge pressure 24 mmHg, V waves noted to 45 mmHg.  Cardiac output 8.58 L/min, cardiac index 4.26.   Severe, calcific three-vessel coronary artery disease.  Severe mitral regurgitation by noninvasive workup.  Significant V waves noted on the wedge tracing.  Will obtain cardiac surgery consult for CABG and mitral valve repair. Diagnostic Dominance: Right   Patient Profile     57 y.o. female with PMH of PAD (s/p multiple interventions and  most recent bilateral TMA 08/08/22), HLD, type 2 DM, GERD, hypothyroidism, cardiology is following for CHF, NSTEMI.   Assessment & Plan      Acute Systolic Heart Failure Severe Mitral Valve Regurgitation   Patient presented to [place] ER on 4/20 complaining of dyspnea on exertion and orthopnea.  BNP was elevated to 486. hsTn 1096>1323>1227. CTA chest on 4/20 showed no PE. Noted pulmonary edema with small-moderate layering bilateral pleural effusions, probable compressive atelectasis versus consolidation of lung bases, mild enlarged mediastinal and bilateral hilar lymph nodes, trace pericardial effusion, aortic and coronary artery atherosclerosis.Echocardiogram this admission showed EF 40-45% with regional wall motion abnormalities, mild LVH, normal RV systolic function, small pericardial effusion, severe mitral valve regurgitation (not noted on old TTE), mild aortic valve stenosis   Good diuresis off oxygen  BP remains low, which limits GDMT. Continue metoprolol succinate 12.5 mg, jardiance   NSTEMI  Severe Three-vessel CAD   Troponin this admission 1096>1323>1227. Patient underwent Right/Left Heart cath that showed severe, calcific three-vessel CAD. With severe mitral regurgitation, CT surgery was consulted for CABG and mitral valve repair. CT surgery saw patient and per their notes, we could either aggressively managed CHF and allow her to recover from recent amputations, perform TEE tomorrow   Patient understandably would prefer to have surgery this admission. However, there may be benefits to waiting a few weeks  Hold plavix Continue metoprolol, ASA, lipitor   Possible d/c after TEE tomorrow and elective surgery CABG /MV in a few weeks    HTN   Home amlodipine held to allow titration of GDMT. Continue metoprolol succinate 12.5 mg daily    HLD   Lipid panel 4/5 showed LDL 54. Continue lipitor 40 mg daily    Acute blood loss anemia from recent bilateral metatarsal amputation    Hemoglobin stable- 7.8 this AM        For questions or updates, please contact [facility] Please consult [email or web address] for contact info under        Signed, Lora Glomski, MD  08/26/2022, 9:20 AM   n 

## 2022-08-27 ENCOUNTER — Encounter (HOSPITAL_COMMUNITY): Payer: Self-pay

## 2022-08-27 ENCOUNTER — Inpatient Hospital Stay (HOSPITAL_COMMUNITY): Payer: Managed Care, Other (non HMO) | Admitting: Anesthesiology

## 2022-08-27 ENCOUNTER — Inpatient Hospital Stay (HOSPITAL_COMMUNITY): Payer: Managed Care, Other (non HMO)

## 2022-08-27 ENCOUNTER — Other Ambulatory Visit (HOSPITAL_COMMUNITY): Payer: Self-pay

## 2022-08-27 ENCOUNTER — Encounter (HOSPITAL_COMMUNITY)
Admission: EM | Disposition: A | Discharge status: Home Health | Payer: Self-pay | Source: Home / Self Care | Attending: Family Medicine

## 2022-08-27 DIAGNOSIS — I340 Nonrheumatic mitral (valve) insufficiency: Secondary | ICD-10-CM

## 2022-08-27 DIAGNOSIS — D638 Anemia in other chronic diseases classified elsewhere: Secondary | ICD-10-CM

## 2022-08-27 DIAGNOSIS — Z87891 Personal history of nicotine dependence: Secondary | ICD-10-CM | POA: Diagnosis not present

## 2022-08-27 DIAGNOSIS — I10 Essential (primary) hypertension: Secondary | ICD-10-CM

## 2022-08-27 DIAGNOSIS — E1151 Type 2 diabetes mellitus with diabetic peripheral angiopathy without gangrene: Secondary | ICD-10-CM

## 2022-08-27 DIAGNOSIS — I5021 Acute systolic (congestive) heart failure: Secondary | ICD-10-CM | POA: Diagnosis not present

## 2022-08-27 DIAGNOSIS — I252 Old myocardial infarction: Secondary | ICD-10-CM

## 2022-08-27 DIAGNOSIS — I350 Nonrheumatic aortic (valve) stenosis: Secondary | ICD-10-CM

## 2022-08-27 DIAGNOSIS — I3139 Other pericardial effusion (noninflammatory): Secondary | ICD-10-CM

## 2022-08-27 DIAGNOSIS — Z794 Long term (current) use of insulin: Secondary | ICD-10-CM

## 2022-08-27 DIAGNOSIS — E039 Hypothyroidism, unspecified: Secondary | ICD-10-CM

## 2022-08-27 DIAGNOSIS — I214 Non-ST elevation (NSTEMI) myocardial infarction: Secondary | ICD-10-CM | POA: Diagnosis not present

## 2022-08-27 HISTORY — PX: TEE WITHOUT CARDIOVERSION: SHX5443

## 2022-08-27 LAB — ECHO TEE
AR max vel: 1.11 cm2
AV Area VTI: 1.08 cm2
AV Area mean vel: 0.99 cm2
AV Mean grad: 24 mmHg
AV Peak grad: 39.2 mmHg
Ao pk vel: 3.13 m/s
MV M vel: 5.86 m/s
MV Peak grad: 137.4 mmHg
MV VTI: 1.99 cm2
Radius: 0.45 cm

## 2022-08-27 LAB — GLUCOSE, CAPILLARY
Glucose-Capillary: 100 mg/dL — ABNORMAL HIGH (ref 70–99)
Glucose-Capillary: 123 mg/dL — ABNORMAL HIGH (ref 70–99)
Glucose-Capillary: 200 mg/dL — ABNORMAL HIGH (ref 70–99)

## 2022-08-27 SURGERY — ECHOCARDIOGRAM, TRANSESOPHAGEAL
Anesthesia: Monitor Anesthesia Care

## 2022-08-27 MED ORDER — METOPROLOL SUCCINATE ER 25 MG PO TB24
12.5000 mg | ORAL_TABLET | Freq: Every day | ORAL | 1 refills | Status: DC
  Filled 2022-08-27: qty 15, 30d supply, fill #0

## 2022-08-27 MED ORDER — EMPAGLIFLOZIN 10 MG PO TABS
10.0000 mg | ORAL_TABLET | Freq: Every day | ORAL | 3 refills | Status: DC
  Filled 2022-08-27: qty 30, 30d supply, fill #0

## 2022-08-27 MED ORDER — ZINC SULFATE 220 (50 ZN) MG PO CAPS: 220.0000 mg | ORAL_CAPSULE | Freq: Every day | ORAL | 0 refills | Status: AC

## 2022-08-27 MED ORDER — FUROSEMIDE 40 MG PO TABS
40.0000 mg | ORAL_TABLET | Freq: Two times a day (BID) | ORAL | 3 refills | Status: DC
  Filled 2022-08-27: qty 30, 15d supply, fill #0

## 2022-08-27 MED ORDER — PROPOFOL 500 MG/50ML IV EMUL
INTRAVENOUS | Status: DC | PRN
  Administered 2022-08-27: 150 ug/kg/min via INTRAVENOUS
  Administered 2022-08-27: 10 mg via INTRAVENOUS

## 2022-08-27 MED ORDER — FUROSEMIDE 40 MG PO TABS: 40.0000 mg | ORAL_TABLET | Freq: Two times a day (BID) | ORAL | Status: DC

## 2022-08-27 NOTE — Transfer of Care (Signed)
Immediate Anesthesia Transfer of Care Note  Patient: Kayla Schmitt  Procedure(s) Performed: TRANSESOPHAGEAL ECHOCARDIOGRAM  Patient Location: PACU and Cath Lab  Anesthesia Type:MAC  Level of Consciousness: drowsy  Airway & Oxygen Therapy: Patient Spontanous Breathing and Patient connected to nasal cannula oxygen  Post-op Assessment: Report given to RN and Post -op Vital signs reviewed and stable  Post vital signs: Reviewed and stable  Last Vitals:  Vitals Value Taken Time  BP    Temp    Pulse    Resp    SpO2      Last Pain:  Vitals:   08/27/22 0735  TempSrc:   PainSc: 0-No pain      Patients Stated Pain Goal: 0 (08/27/22 0735)  Complications: No notable events documented.

## 2022-08-27 NOTE — Progress Notes (Signed)
Heart Failure Navigator Progress Note  Assessed for Heart & Vascular TOC clinic readiness.  Patient with EF 40-45 % Consult with TCTS for planned CABG. Has a surgical follow up on 09/17/22 to discuss CABG. Will plan to interview for HF TOC after CABG. .   Navigator will sign off this time.   Rhae Hammock, BSN, Scientist, clinical (histocompatibility and immunogenetics) Only

## 2022-08-27 NOTE — Progress Notes (Signed)
Rounding Note    Patient Name: Kayla Schmitt Date of Encounter: 08/27/2022  [facility] Cardiologist: Jodelle Red, MD   Subjective   Denies any chest pain or shortness of breath.  Anxious to go home.  TEE done earlier today showed mild LV dysfunction EF 40 to 45% with degenerative mitral valve with tethering of the posterior leaflet with moderate mitral regurgitation and normal pulmonary vein flow, mild to moderate aortic stenosis, trivial TR/PR and no PFO.  Grade 3 aortic plaque in the ascending aorta  Inpatient Medications    Scheduled Meds:  aspirin EC  81 mg Oral Daily   atorvastatin  40 mg Oral Daily   empagliflozin  10 mg Oral Daily   enoxaparin (LOVENOX) injection  40 mg Subcutaneous Q24H   famotidine  20 mg Oral Daily   furosemide  60 mg Intravenous BID   gabapentin  300 mg Oral QHS   insulin aspart  0-15 Units Subcutaneous TID WC   insulin aspart  0-5 Units Subcutaneous QHS   insulin glargine-yfgn  12 Units Subcutaneous QHS   levothyroxine  150 mcg Oral Q0600   metoprolol succinate  12.5 mg Oral Daily   pantoprazole  40 mg Oral Daily   sodium chloride flush  3 mL Intravenous Q12H   sodium chloride flush  3 mL Intravenous Q12H   Continuous Infusions:  sodium chloride     PRN Meds: sodium chloride, acetaminophen, ondansetron (ZOFRAN) IV, ondansetron **OR** [DISCONTINUED] ondansetron (ZOFRAN) IV, mouth rinse, polyethylene glycol, sodium chloride flush   Vital Signs    Vitals:   08/27/22 0455 08/27/22 0920 08/27/22 0930 08/27/22 0940  BP: 116/64 (!) 107/55 (!) 108/56 (!) 122/58  Pulse: 75 73 75 73  Resp: 19 20 19 16   Temp: 98.2 F (36.8 C) 98 F (36.7 C)    TempSrc: Oral Temporal    SpO2:  94% 95% 100%  Weight: 90.9 kg     Height:        Intake/Output Summary (Last 24 hours) at 08/27/2022 1114 Last data filed at 08/27/2022 0919 Gross per 24 hour  Intake 446 ml  Output 1750 ml  Net -1304 ml       08/27/2022    4:55 AM  08/26/2022    5:01 AM 08/25/2022    3:41 AM  Last 3 Weights  Weight (lbs) 200 lb 6.4 oz 202 lb 6.1 oz 204 lb 9.6 oz  Weight (kg) 90.9 kg 91.8 kg 92.806 kg      Telemetry    Normal rhythm- Personally Reviewed  ECG    No new tracing - Personally Reviewed  Physical Exam   GEN: Well nourished, well developed in no acute distress HEENT: Normal NECK: No JVD; No carotid bruits LYMPHATICS: No lymphadenopathy CARDIAC:RRR, no rubs, gallops, grade 2/6 systolic murmur at the right upper sternal border rating to the left upper sternal border RESPIRATORY:  Clear to auscultation without rales, wheezing or rhonchi  ABDOMEN: Soft, non-tender, non-distended MUSCULOSKELETAL:  No edema; bilateral transmetatarsal amputations feet bandaged SKIN: Warm and dry NEUROLOGIC:  Alert and oriented x 3 PSYCHIATRIC:  Normal affect  Labs    High Sensitivity Troponin:   Recent Labs  Lab 08/18/22 1344 08/18/22 1607 08/19/22 0228  TROPONINIHS 1,096* 1,323* 1,227*      Chemistry Recent Labs  Lab 08/23/22 0252 08/23/22 0920 08/24/22 0309 08/25/22 0235 08/25/22 1424  NA 140   < > 136 138 136  K 4.6   < > 3.9 3.8 3.9  CL 101  --  99 97* 96*  CO2 27  --  27 30 28   GLUCOSE 144*  --  111* 138* 132*  BUN 26*  --  23* 25* 22*  CREATININE 1.21*  --  1.09* 1.26* 1.28*  CALCIUM 8.7*  --  8.4* 8.8* 9.0  MG 1.7  --  1.7 1.9  --   GFRNONAA 53*  --  60* 50* 49*  ANIONGAP 12  --  10 11 12    < > = values in this interval not displayed.     Lipids No results for input(s): "CHOL", "TRIG", "HDL", "LABVLDL", "LDLCALC", "CHOLHDL" in the last 168 hours.  Hematology Recent Labs  Lab 08/23/22 0252 08/23/22 0920 08/23/22 0926 08/24/22 0309 08/25/22 0235  WBC 5.1  --   --  4.6 4.9  RBC 2.61*  --   --  2.74* 2.83*  HGB 7.4*   < > 7.8* 7.7* 7.8*  HCT 23.2*   < > 23.0* 24.4* 25.9*  MCV 88.9  --   --  89.1 91.5  MCH 28.4  --   --  28.1 27.6  MCHC 31.9  --   --  31.6 30.1  RDW 17.4*  --   --  17.1* 17.0*   PLT 291  --   --  298 327   < > = values in this interval not displayed.    Thyroid No results for input(s): "TSH", "FREET4" in the last 168 hours.  BNP No results for input(s): "BNP", "PROBNP" in the last 168 hours.   DDimer No results for input(s): "DDIMER" in the last 168 hours.   Radiology    EP STUDY  Result Date: 08/27/2022 See surgical note for result.   Cardiac Studies   Echocardiogram 08/19/22  1. Left ventricular ejection fraction, by estimation, is 40 to 45%. The  left ventricle has mildly decreased function. The left ventricle  demonstrates regional wall motion abnormalities (see scoring  diagram/findings for description). There is mild left  ventricular hypertrophy. Left ventricular diastolic parameters are  indeterminate.   2. Right ventricular systolic function is normal. The right ventricular  size is normal. Tricuspid regurgitation signal is inadequate for assessing  PA pressure.   3. Left atrial size was mildly dilated.   4. A small pericardial effusion is present.   5. The mitral valve is degenerative. Severe mitral valve regurgitation.  No evidence of mitral stenosis.   6. The aortic valve was not well visualized. There is moderate  calcification of the aortic valve. There is moderate thickening of the  aortic valve. Aortic valve regurgitation is trivial. Mild aortic valve  stenosis. Aortic valve mean gradient measures  10.0 mmHg.   7. The inferior vena cava is dilated in size with <50% respiratory  variability, suggesting right atrial pressure of 15 mmHg.    Right/Left Heart Catheterization 08/23/22   Ost LAD to Prox LAD lesion is 75% stenosed.   Ost Cx to Prox Cx lesion is 100% stenosed.   1st Diag lesion is 25% stenosed.   Mid RCA lesion is 70% stenosed.   Mid LM to Dist LM lesion is 40% stenosed.   Mid LAD lesion is 70% stenosed.   LV end diastolic pressure is moderately elevated.   Hemodynamic findings consistent with mild pulmonary  hypertension.   There is no aortic valve stenosis.   Aortic saturation 91%, PA saturation 60%, mean RA pressure 9 mmHg, PA pressure 42/24, mean PA pressure 32 mmHg, mean pulmonary capillary wedge  pressure 24 mmHg, V waves noted to 45 mmHg.  Cardiac output 8.58 L/min, cardiac index 4.26.   Severe, calcific three-vessel coronary artery disease.  Severe mitral regurgitation by noninvasive workup.  Significant V waves noted on the wedge tracing.  Will obtain cardiac surgery consult for CABG and mitral valve repair. Diagnostic Dominance: Right   Patient Profile     57 y.o. female with PMH of PAD (s/p multiple interventions and most recent bilateral TMA 08/08/22), HLD, type 2 DM, GERD, hypothyroidism, cardiology is following for CHF, NSTEMI.   Assessment & Plan    Acute Systolic Heart Failure Severe Mitral Valve Regurgitation   Patient presented to [facility] ER on 4/20 complaining of dyspnea on exertion and orthopnea.  BNP was elevated to 486. hsTn [id number]. CTA chest on 4/20 showed no PE. Noted pulmonary edema with small-moderate layering bilateral pleural effusions, probable compressive atelectasis versus consolidation of lung bases, mild enlarged mediastinal and bilateral hilar lymph nodes, trace pericardial effusion, aortic and coronary artery atherosclerosis.Echocardiogram this admission showed EF 40-45% with regional wall motion abnormalities, mild LVH, normal RV systolic function, small pericardial effusion, severe mitral valve regurgitation (not noted on old TTE), mild aortic valve stenosis   BP stable at 122/58 mmHg  She put out 2.4 L yesterday and is net -8.4 L since admission Serum creatinine stable at 1.28, potassium 3.9 and magnesium 1.9 Continue Jardiance 10 mg daily, Toprol-XL 12.5 mg daily Soft BP has limited GDMT Appears euvolemic on exam today Transition IV Lasix to 40 mg p.o. twice daily Hopefully can add ARB/ARNi/MRA as outpatient if BP improves She is anxious to go  home.  Will await recommendations by CVTS but likely she will go home with planned outpatient CABG in several weeks   NSTEMI  Severe Three-vessel CAD   Troponin this admission [id number]. Patient underwent Right/Left Heart cath that showed severe, calcific three-vessel CAD. With severe mitral regurgitation, CT surgery was consulted for CABG and mitral valve repair. CT surgery saw patient and per their notes, we could either aggressively managed CHF and allow her to recover from recent amputations, perform TEE tomorrow   TEE this admission showed moderate mitral regurgitation with normal pulmonary vein flow.  MR appears to be ischemic from papillary muscle dysfunction resulting in tethering of the posterior leaflet Await further recommendations by CVTS in regards to plan timing of CABG Continue aspirin 81 mg daily, atorvastatin 40 mg daily, Toprol-XL 12.5 mg daily   HTN  -BP controlled but on the soft soft side -Home amlodipine held to allow titration of GDMT.  -Continue Toprol-XL 12.5 mg daily   HLD  -Recommended LDL goal less than 55 -Lipids this admission showed LDL 54, HDL 13, triglycerides 127 and LP(a) 79.5  -Continue atorvastatin 40 mg daily -   Acute blood loss anemia from recent bilateral metatarsal amputation   Hemoglobin stable- 7.8 this AM  CBC pending this morning  Mitral regurgitation  TEE this admission showed moderate mitral regurgitation with normal pulmonary vein flow.  MR apperas to be ischemic from papillary muscle dysfunction resulting in tethering of the posterior leaflet Await further recommendations by CVTS>> MR may actually improve improved  blood flow and function to the myocardium but this appears to be ischemic in etiology  Aortic stenosis -TEE felt to be moderate aortic stenosis -Mild by 2D echo showed V-max 2.07 m/s, mean aortic valve gradient 10 mmHg, AVA (VTI) 2.98 cm2 and DVI 1.17 -I am going to asked the echo tech to try to get planimetered  AVA  from TEE  Await further recs by CVTS     I have spent a total of 35 minutes with patient reviewing cardiac cath, TEE, 2D echo , telemetry, EKGs, labs and examining patient as well as establishing an assessment and plan that was discussed with the patient.  > 50% of time was spent in direct patient care.    For questions or updates, please contact [facility] Please consult [email or web address] for contact info under        Signed, Armanda Magic, MD  08/27/2022, 11:14 AM   n

## 2022-08-27 NOTE — Progress Notes (Signed)
  Echocardiogram Echocardiogram Transesophageal has been performed.  Delcie Roch 08/27/2022, 9:23 AM

## 2022-08-27 NOTE — Progress Notes (Signed)
Pt discharged before being seen for education regarding pre-op and NSTEMI. She has a referral for CRPII however she is n/a until CAD is addressed with CABG. To follow up in [facility] office for preop information.  Ethelda Chick BS, ACSM-CEP 08/27/2022 2:38 PM

## 2022-08-27 NOTE — Discharge Summary (Addendum)
Physician Discharge Summary  Kayla Schmitt ZOX:[id number] DOB: [birth year] DOA: 08/18/2022  PCP: [facility]  Admit date: 08/18/2022  Discharge date: 08/27/2022  Admitted From: Home.  Disposition:  Home with Home Health Services.  Recommendations for Outpatient Follow-up:  Follow up with PCP in 1-2 weeks. Please obtain BMP/CBC in one week. Advised to follow-up with Cardiologist as scheduled. Advised to follow-up with Cardiothoracic surgery for CABG and mitral valve repair.  Home Health: Home PT/OT Equipment/Devices:None  Discharge Condition: Stable CODE STATUS:Full code Diet recommendation: Heart Healthy   Brief [facility] Hospital Course: This 57 yrs old female with medical history significant of type 2 diabetes on insulin, hypertension, hypothyroidism, peripheral artery disease on antiplatelet therapy, GERD, Chronic anemia.  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  The patient reports having increased bleeding when she went home from the surgical incision sites.  She reports bleeding a fair amount, but did not come back to the hospital for evaluation.  Few days ago, the bleeding stopped and has remained hemostatic for the past several days.  She has been noting increased difficulty breathing and shortness of breath.  She was having difficulty laying down flat.  She was admitted for NSTEMI with systolic heart failure. Cardiology was consulted, Patient underwent LHC found to have Severe, calcific three-vessel coronary artery disease. Severe mitral regurgitation by non-invasive workup. CT sx was consulted, plan is to have TEE on Monday and discharged until wounds on her feet heals.  She will need CABG and MVR in 3- 4 weeks. Patient has appointment with Dr. Lajoyce Corners in 4 weeks.  She needs to follow-up with cardiothoracic surgery on 5/20 for CABG or MV repair scheduling.  Home health services arranged.  Patient is being discharged  home.  Discharge Diagnoses:  Principal Problem:   NSTEMI (non-ST elevated myocardial infarction) (HCC) Active Problems:   DM type 2 (diabetes mellitus, type 2) (HCC)   Hypertension   Hypothyroidism   Acute clinical systolic heart failure (HCC)   Acute blood loss anemia   Anemia   Severe mitral regurgitation by prior echocardiogram  Acute hypoxic respiratory failure: Acute systolic heart failure in setting of NSTEMI: She was initially started on lasix 40 mg bid, increased to 60 mg IV BID, monitor urine output.  ECHO showed LVEF of 40 to 45%, mildly decreased function, with regional wall motion abnormalities. Diastolic parameters are indeterminate. Severe MVR. Cardiology on board ,Suspect ischemic cardiomyopathy, recommended cardiac cath once euvolemic.  Elevated troponins probably from demand ischemia from CHF and anemia.  Currently on aspirin and plavix and lipitor.  Patient underwent left heart cath found to have severe, calcific three-vessel coronary artery disease, severe MR. Cardiothoracic surgery consulted for possible CABG and MVR. TEE completed , showed moderate MR with tethering of the posterior leaflet. Cardiothoracic surgery recommended to hold on CABG and mitral repair until metatarsal wounds healed. Patient is being discharged.  Outpatient follow-up with cardiothoracic surgery is scheduled. She is weaned down to room air.   Symptomatic anemia: Acute blood loss anemia from recent bilateral metatarsal amputation: Hb 6.1 on Admission > S/p 1 unit of prbc transfusion.  Stool for occult blood is negative on admission. She denies any melena or use of NSAIDS.  Anemia panel not significant.  Baseline hemoglobin  between 9.8 to 11 in 2020 and 10.1 in 01/2022.  GI consulted, recommended outpatient work up for now.  Transfuse to keep hemoglobin  greater than 7.    Insulin dependent DM : Continue  SSI and semglee 12 units at bedtime.  No changes in meds.    Gangrene of the  toes bilateral feet:  S/p bilateral transmetatarsal amputation by Dr Lajoyce Corners.  Wound care as per Dr Lajoyce Corners. Follow-up Dr. Lajoyce Corners outpatient on 09/05/22.   Critical limb ischemia / PVD: 4/4- Left SFA and above knee popliteal artery mechanical thrombectomy, with angioplasty and stenting of left above knee popliteal artery and SFA   4/8- Right SFA and above knee popliteal artery laser atherectomy, with DCBA of the right above knee popliteal artery and SFA.  She was discharged on aspirin and plavix on 4/15 by vascular surgery.  Continue with aspirin and plavix and lipitor.    Hypothyroidism:  Continue synthroid.    Acute Kidney Injury: Looks like her baseline creatinine is less than 1.0,  She admitted with a creatinine of 1.41, improved to 1.28 today.  Continue to monitor. Avoid nephrotoxic medications.   Estimated body mass index is 37.88 kg/m as calculated from the following:   Height as of this encounter: 5\' 4"  (1.626 m).   Weight as of this encounter: 100.1 kg.  Discharge Instructions  Discharge Instructions     AMB referral to Phase II Cardiac Rehabilitation   Complete by: As directed    Diagnosis: Type II MI   After initial evaluation and assessments completed: Virtual Based Care may be provided alone or in conjunction with Phase 2 Cardiac Rehab based on patient barriers.: Yes   Intensive Cardiac Rehabilitation (ICR) [facility] location only OR Traditional Cardiac Rehabilitation (TCR) *If criteria for ICR are not met will enroll in TCR [facility] only): Yes   Call MD for:  difficulty breathing, headache or visual disturbances   Complete by: As directed    Call MD for:  persistant dizziness or light-headedness   Complete by: As directed    Call MD for:  persistant nausea and vomiting   Complete by: As directed    Diet - low sodium heart healthy   Complete by: As directed    Diet Carb Modified   Complete by: As directed    Discharge instructions   Complete by: As directed    Advised to  follow-up with primary care physician in 1 week. Advised to follow-up with cardiologist as scheduled. Advised to follow-up with cardiothoracic surgery for CABG and mitral valve repair.   Increase activity slowly   Complete by: As directed    No wound care   Complete by: As directed    Follow up Podiatry      Allergies as of 08/27/2022       Reactions   Doxycycline Nausea And Vomiting   Extremely N/V   Trental [pentoxifylline] Nausea And Vomiting   Penicillins Rash   Tolerated cephalosporins 07/2022        Medication List     STOP taking these medications    ibuprofen 600 MG tablet Commonly known as: ADVIL   nitroGLYCERIN 0.2 mg/hr patch Commonly known as: NITRODUR - Dosed in mg/24 hr   pentoxifylline 400 MG CR tablet Commonly known as: TRENTAL       TAKE these medications    acetaminophen 500 MG tablet Commonly known as: TYLENOL Take 500-1,000 mg by mouth every 6 (six) hours as needed for moderate pain.   amLODipine 10 MG tablet Commonly known as: NORVASC Take 10 mg by mouth daily.   aspirin EC 81 MG tablet Take 1 tablet by mouth daily.   atorvastatin 40 MG tablet Commonly known as: LIPITOR Take 40  mg by mouth daily.   clopidogrel 75 MG tablet Commonly known as: PLAVIX TAKE 1 TABLET BY MOUTH EVERY DAY WITH BREAKFAST What changed: See the new instructions.   famotidine 20 MG tablet Commonly known as: PEPCID Take 20 mg by mouth daily.   furosemide 40 MG tablet Commonly known as: LASIX Take 1 tablet (40 mg total) by mouth 2 (two) times daily.   gabapentin 300 MG capsule Commonly known as: NEURONTIN Take 300 mg by mouth at bedtime.   Jardiance 10 MG Tabs tablet Generic drug: empagliflozin Take 1 tablet (10 mg total) by mouth daily. Start taking on: August 28, 2022   levothyroxine 150 MCG tablet Commonly known as: SYNTHROID Take 150 mcg by mouth daily.   metoprolol succinate 25 MG 24 hr tablet Commonly known as: TOPROL-XL Take 0.5 tablets  (12.5 mg total) by mouth daily. Start taking on: August 28, 2022   Ozempic (1 MG/DOSE) 2 MG/1.5ML Sopn Generic drug: Semaglutide (1 MG/DOSE) Inject 1 mg into the skin every Saturday.   pantoprazole 40 MG tablet Commonly known as: PROTONIX Take 40 mg by mouth daily.   polyethylene glycol 17 g packet Commonly known as: MIRALAX / GLYCOLAX Take 17 g by mouth daily.   Evaristo Bury FlexTouch 100 UNIT/ML FlexTouch Pen Generic drug: insulin degludec Inject 10 Units into the skin at bedtime. What changed: how much to take   zinc sulfate 220 (50 Zn) MG capsule Take 1 capsule (220 mg total) by mouth daily for 9 days.   ZzzQuil 25 MG Caps Generic drug: diphenhydrAMINE HCl (Sleep) Take 25 mg by mouth at bedtime as needed (Sleep).        Follow-up Information     [facility] Follow up in 1 week(s).   Specialty: Internal Medicine Contact information: [address] [place] [address] [phone]         Eugenio Hoes, MD Follow up in 4 week(s).   Specialty: Cardiothoracic Surgery Why: CABG/ MVR Contact information: [address] [place] [address] [phone]         [facility] Follow up.   Why: Home Health Services-office to call patient with visit times. Contact information: [address] [place] [address] [phone]                Allergies  Allergen Reactions   Doxycycline Nausea And Vomiting    Extremely N/V   Trental [Pentoxifylline] Nausea And Vomiting   Penicillins Rash    Tolerated cephalosporins 07/2022     Consultations: Cardiology Cardiothoracic surgery   Procedures/Studies: EP STUDY  Result Date: 08/27/2022 See surgical note for result.  CARDIAC CATHETERIZATION  Result Date: 08/23/2022   Ost LAD to Prox LAD lesion is 75% stenosed.   Ost Cx to Prox Cx lesion is 100% stenosed.   1st Diag lesion is 25% stenosed.   Mid RCA lesion is 70% stenosed.   Mid LM to Dist LM lesion is 40%  stenosed.   Mid LAD lesion is 70% stenosed.   LV end diastolic pressure is moderately elevated.   Hemodynamic findings consistent with mild pulmonary hypertension.   There is no aortic valve stenosis.   Aortic saturation 91%, PA saturation 60%, mean RA pressure 9 mmHg, PA pressure 42/24, mean PA pressure 32 mmHg, mean pulmonary capillary wedge pressure 24 mmHg, V waves noted to 45 mmHg.  Cardiac output 8.58 L/min, cardiac index 4.26. Severe, calcific three-vessel coronary artery disease.  Severe mitral regurgitation  by noninvasive workup.  Significant V waves noted on the wedge tracing.  Will obtain cardiac surgery consult for CABG and mitral valve repair.   ECHOCARDIOGRAM COMPLETE  Result Date: 08/19/2022    ECHOCARDIOGRAM REPORT   Patient Name:   Kayla Schmitt [facility] Date of Exam: 08/19/2022 Medical Rec #:  [id number]              Height:       64.0 in Accession #:    [id number]             Weight:       222.2 lb Date of Birth:  Dec 02, 1965               BSA:          2.046 m Patient Age:    56 years               BP:           114/68 mmHg Patient Gender: F                      HR:           95 bpm. Exam Location:  Inpatient Procedure: 2D Echo, 3D Echo, Cardiac Doppler, Color Doppler and Strain Analysis Indications:    R06.9 DOE; R06.02 SOB  History:        Patient has prior history of Echocardiogram examinations. Risk                 Factors:Diabetes, Hypertension and Former Smoker.  Sonographer:    Dondra Prader RVT RCS Referring Phys: [phone] JACOB J STINSON  Sonographer Comments: Image acquisition challenging due to respiratory motion. Patient was short of breath; did Echo with patient sitting up. IMPRESSIONS  1. Left ventricular ejection fraction, by estimation, is 40 to 45%. The left ventricle has mildly decreased function. The left ventricle demonstrates regional wall motion abnormalities (see scoring diagram/findings for description). There is mild left ventricular hypertrophy. Left ventricular diastolic  parameters are indeterminate.  2. Right ventricular systolic function is normal. The right ventricular size is normal. Tricuspid regurgitation signal is inadequate for assessing PA pressure.  3. Left atrial size was mildly dilated.  4. A small pericardial effusion is present.  5. The mitral valve is degenerative. Severe mitral valve regurgitation. No evidence of mitral stenosis.  6. The aortic valve was not well visualized. There is moderate calcification of the aortic valve. There is moderate thickening of the aortic valve. Aortic valve regurgitation is trivial. Mild aortic valve stenosis. Aortic valve mean gradient measures 10.0 mmHg.  7. The inferior vena cava is dilated in size with <50% respiratory variability, suggesting right atrial pressure of 15 mmHg. FINDINGS  Left Ventricle: Left ventricular ejection fraction, by estimation, is 40 to 45%. The left ventricle has mildly decreased function. The left ventricle demonstrates regional wall motion abnormalities. The left ventricular internal cavity size was normal in size. There is mild left ventricular hypertrophy. Left ventricular diastolic parameters are indeterminate.  LV Wall Scoring: The antero-lateral wall and apical lateral segment are hypokinetic. Right Ventricle: The right ventricular size is normal. No increase in right ventricular wall thickness. Right ventricular systolic function is normal. Tricuspid regurgitation signal is inadequate for assessing PA pressure. Left Atrium: Left atrial size was mildly dilated. Right Atrium: Right atrial size was normal in size. Pericardium: A small pericardial effusion is present. Mitral Valve: The mitral valve is degenerative in appearance. Mild to moderate mitral annular calcification.  Severe mitral valve regurgitation. No evidence of mitral valve stenosis. Tricuspid Valve: The tricuspid valve is normal in structure. Tricuspid valve regurgitation is trivial. No evidence of tricuspid stenosis. Aortic Valve: The  aortic valve was not well visualized. There is moderate calcification of the aortic valve. There is moderate thickening of the aortic valve. Aortic valve regurgitation is trivial. Aortic regurgitation PHT measures 530 msec. Mild aortic stenosis is present. Aortic valve mean gradient measures 10.0 mmHg. Aortic valve peak gradient measures 17.2 mmHg. Aortic valve area, by VTI measures 2.98 cm. Pulmonic Valve: The pulmonic valve was normal in structure. Pulmonic valve regurgitation is trivial. No evidence of pulmonic stenosis. Aorta: The aortic root is normal in size and structure. Venous: The inferior vena cava is dilated in size with less than 50% respiratory variability, suggesting right atrial pressure of 15 mmHg. IAS/Shunts: The interatrial septum was not well visualized.  LEFT VENTRICLE PLAX 2D LVIDd:         5.40 cm LVIDs:         4.00 cm LV PW:         1.10 cm LV IVS:        1.00 cm LVOT diam:     1.80 cm LV SV:         119 LV SV Index:   58 LVOT Area:     2.54 cm  LV Volumes (MOD) LV vol d, MOD A2C: 169.0 ml LV vol d, MOD A4C: 161.0 ml LV vol s, MOD A2C: 62.1 ml LV vol s, MOD A4C: 84.9 ml LV SV MOD A2C:     106.9 ml LV SV MOD A4C:     161.0 ml LV SV MOD BP:      91.8 ml RIGHT VENTRICLE             IVC RV Basal diam:  3.90 cm     IVC diam: 2.20 cm RV Mid diam:    2.50 cm RV S prime:     14.80 cm/s TAPSE (M-mode): 2.2 cm LEFT ATRIUM             Index        RIGHT ATRIUM           Index LA diam:        4.10 cm 2.00 cm/m   RA Area:     11.80 cm LA Vol (A2C):   80.1 ml 39.15 ml/m  RA Volume:   28.60 ml  13.98 ml/m LA Vol (A4C):   46.6 ml 22.78 ml/m LA Biplane Vol: 61.6 ml 30.11 ml/m  AORTIC VALVE                     PULMONIC VALVE AV Area (Vmax):    2.42 cm      PV Vmax:       0.91 m/s AV Area (Vmean):   2.38 cm      PV Peak grad:  3.3 mmHg AV Area (VTI):     2.98 cm AV Vmax:           207.50 cm/s AV Vmean:          149.500 cm/s AV VTI:            0.400 m AV Peak Grad:      17.2 mmHg AV Mean Grad:       10.0 mmHg LVOT Vmax:         197.00 cm/s LVOT Vmean:  140.000 cm/s LVOT VTI:          0.468 m LVOT/AV VTI ratio: 1.17 AI PHT:            530 msec AR Vena Contracta: 0.40 cm  AORTA Ao Root diam: 3.00 cm Ao Asc diam:  3.30 cm Ao Arch diam: 2.9 cm MITRAL VALVE MV Area (PHT): 4.49 cm       SHUNTS MV Decel Time: 169 msec       Systemic VTI:  0.47 m MR Peak grad:    58.1 mmHg    Systemic Diam: 1.80 cm MR Mean grad:    42.0 mmHg MR Vmax:         381.00 cm/s MR Vmean:        316.0 cm/s MR PISA:         6.28 cm MR PISA Eff ROA: 51 mm MR PISA Radius:  1.00 cm MV E velocity: 133.00 cm/s MV A velocity: 92.70 cm/s MV E/A ratio:  1.43 Weston Brass MD Electronically signed by Weston Brass MD Signature Date/Time: 08/19/2022/5:57:20 PM    Final    CT Angio Chest PE W and/or Wo Contrast  Result Date: 08/18/2022 CLINICAL DATA:  Pulmonary embolism (PE) suspected, high prob EXAM: CT ANGIOGRAPHY CHEST WITH CONTRAST TECHNIQUE: Multidetector CT imaging of the chest was performed using the standard protocol during bolus administration of intravenous contrast. Multiplanar CT image reconstructions and MIPs were obtained to evaluate the vascular anatomy. RADIATION DOSE REDUCTION: This exam was performed according to the departmental dose-optimization program which includes automated exposure control, adjustment of the mA and/or kV according to patient size and/or use of iterative reconstruction technique. CONTRAST:  75mL OMNIPAQUE IOHEXOL 350 MG/ML SOLN COMPARISON:  X-ray 08/18/2022 FINDINGS: Cardiovascular: Satisfactory opacification of the pulmonary arteries to the segmental level. No evidence of pulmonary embolism. Thoracic aorta is nonaneurysmal. Atherosclerotic calcifications of the aorta and coronary arteries. Normal heart size. Trace pericardial effusion. Mediastinum/Nodes: Mildly enlarged mediastinal and bilateral hilar lymph nodes, largest measuring up to 1.1 cm short axis in the precarinal space (series 6, image  40). No axillary lymphadenopathy. Thyroid gland, trachea, and esophagus demonstrate no significant abnormality. Lungs/Pleura: Small-moderate layering bilateral pleural effusions. Probable compressive atelectasis versus consolidation within the dependent aspects of the right and left lower lobe. Bilateral perihilar ground-glass opacities with interlobular septal thickening. No pneumothorax. Upper Abdomen: No acute abnormality. Musculoskeletal: No chest wall abnormality. No acute or significant osseous findings. Review of the MIP images confirms the above findings. IMPRESSION: 1. No evidence of pulmonary embolism. 2. Findings suggestive of pulmonary edema with small-moderate layering bilateral pleural effusions. 3. Probable compressive atelectasis versus consolidation within the dependent aspects of the lung bases. 4. Mildly enlarged mediastinal and bilateral hilar lymph nodes, likely reactive. 5. Trace pericardial effusion. 6. Aortic and coronary artery atherosclerosis (ICD10-I70.0). Electronically Signed   By: Duanne Guess D.O.   On: 08/18/2022 15:35   DG Chest 2 View  Result Date: 08/18/2022 CLINICAL DATA:  Shortness of breath EXAM: CHEST - 2 VIEW COMPARISON:  None Available. FINDINGS: Normal cardiopericardial silhouette bilateral interstitial changes. Tiny effusions. No pneumothorax or consolidation IMPRESSION: Bilateral interstitial changes with small effusions. Acute process is possible. Recommend follow-up Electronically Signed   By: Karen Kays M.D.   On: 08/18/2022 14:03   PERIPHERAL VASCULAR CATHETERIZATION  Result Date: 08/06/2022 Images from the original result were not included.   Patient name: Kayla Schmitt            MRN: [id number]  DOB: Sep 02, 1965            Sex: female  08/06/2022 Pre-operative Diagnosis: Critical limb ischemia of the right lower extremity with in-stent SFA stenosis and additional above-knee popliteal high-grade stenosis Post-operative diagnosis:  Same Surgeon:   Cephus Shelling, MD Procedure Performed: 1.  Ultrasound-guided access left common femoral artery 2.  Right lower extremity arteriogram with selection of third order branches 3.  Laser atherectomy of the right SFA and above-knee popliteal artery (2.0 mm Auryon laser at 50 MHz and 60 MHz) 4.  Drug-coated balloon angioplasty of the entire right SFA and above-knee popliteal artery (5.0 mm drug-coated Ranger) 5.  52 minutes of monitored moderate conscious sedation time  Indications: Patient is a 57 year old female who presented with nonhealing wounds to her bilateral lower extremities.  She underwent left leg intervention last week.  Today she presents for a staged right leg intervention where she has evidence of disease in the SFA including in-stent stenosis and additional above-knee popliteal artery high-grade stenosis.  She presents after risks benefits discussed.  Findings:  Aortogram was deferred given this was performed last week.  Right lower extremity arteriogram again showed a moderate stenosis in the proximal SFA, multiple high-grade stenosis greater than 80% in the previous SFA stents with in-stent stenosis, and a high-grade greater than 80% calcified above-knee popliteal stenosis.  The right SFA and above-knee popliteal artery was treated in its entirety with a 2.0 mm Auryon laser at 50 and 60 MHz.  The right SFA and above-knee popliteal artery was treated with drug-coated balloon angioplasty to nominal pressure for 3 minutes with a 5 mm drug coated Ranger.  Widely patent SFA above-knee popliteal artery with patent stents at completion.  Preserved three-vessel runoff distally.  Dominant runoff in the foot is through the posterior tibial artery.             Procedure:  The patient was identified in the holding area and taken to room 8.  The patient was then placed supine on the table and prepped and draped in the usual sterile fashion.  A time out was called.  Ultrasound was used to evaluate the left  common femoral artery.  It was patent .  A digital ultrasound image was acquired.  A micropuncture needle was used to access the left common femoral artery under ultrasound guidance.  An 018 wire was advanced without resistance and a micropuncture sheath was placed.  The 018 wire was removed and a benson wire was placed.  The micropuncture sheath was exchanged for a 5 french sheath.  An omniflush catheter was advanced over the wire to the level of L-1.  An abdominal angiogram was deferred given this was performed last week.  I crossed the aortic bifurcation with an Omni flush catheter and got a wire into the right SFA.  I exchanged for a long 6 French Catapault sheath in the left groin over the aortic bifurcation.  Patient was given 100 units/kg IV heparin.  I then got some hand shots to identify the SFA and above-knee popliteal lesions.  I then used a Glidewire with an 035 quick cross to cross the SFA above-knee popliteal disease and get my wire into the below-knee popliteal artery.  I then exchanged for a [place] core wire placed down the the right peroneal.  I then used 2.0 mm Auryon laser and made two passes at 50 and 60 MHz in the right SFA and above-knee popliteal artery.  I then treated the entire segment  with a 5 mm drug-coated balloon Ranger for 3 minutes.  Final imaging showed no dissection with preserved runoff.  Although she does have a calcified diseased artery there was no flow-limiting stenosis and all this was successfully treated.  Wires and catheters were removed.  I put a short 6 French sheath in the left groin should be taken holding for sheath removal.  Plan: Excellent results after right SFA above-knee popliteal laser atherectomy with DCB.  Optimized for TMA with Dr. Lajoyce Corners on Wednesday.  Continue aspirin Plavix statin.     Cephus Shelling, MD [facility] Office: [phone]    PERIPHERAL VASCULAR CATHETERIZATION  Result Date: 08/02/2022 Images from the  original result were not included.   Patient name: Kayla Schmitt            MRN: [id number]        DOB: [birth year]            Sex: female  08/02/2022 Pre-operative Diagnosis: Critical limb ischemia of bilateral lower extremities with nonhealing toe amputations Post-operative diagnosis:  Same Surgeon:  Cephus Shelling, MD Procedure Performed: 1.  Ultrasound-guided access right common femoral artery 2.  Aortogram with catheter selection of aorta 3.  Bilateral lower extremity arteriogram with selection of third order branches in the left lower extremity 4.  Left SFA and above-knee popliteal artery percutaneous mechanical thrombectomy of occluded distal SFA/above knee popliteal artery stent (Penumbra lightening 7 and JETI thrombectomy device) 5.  Angioplasty and stent of left above-knee popliteal artery more distal to existing stents (6 mm x 100 mm Eluvia) 6.  Angioplasty of existing left SFA/above knee popliteal artery stents with a 5 mm Mustang 7.  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  Most recently underwent left SFA stenting and above-knee popliteal artery stenting on 07/05/2022 for new tissue loss.  She had bilateral toe amputations with orthopedic surgery that are now necrotic and nonhealing.  She presents today for bilateral lower extremity arteriogram with possible intervention as her duplex showed the distal left SFA stent was occluded.  Will also evaluate the runoff on the right side.  Findings:  Aortogram showed no flow-limiting stenosis in the aortoiliac segment.  Left lower extremity arteriogram showed no flow-limiting stenosis in the common femoral although this is heavily diseased.  The profunda has a high grade stenosis.  The proximal to mid SFA stents were patent.  The most distal stent at the distal  SFA at Hunter's canal into the above-knee popliteal artery was occluded with multiple collaterals.  Distally there was three-vessel runoff.  Ultimately we were able to get down to the occluded stents and initially tried percutaneous mechanical thrombectomy with the penumbra lightening 7.  This catheter would not track which I thought was due to the angulation of the tip.  We then tried the JETI and I made several passes with the different thrombectomy device that had a straight tip and this tracked more easily.  Ultimately elected to extend the Eluvia stents on the left after we got a flow channel with an additional 6 mm x 100 mm Eluvia into the above knee popliteal artery.  We postdilated all of her existing left SFA stents with 5 mm angioplasty balloon except for a 4 mm balloon distally at the popliteal artery which was smaller in caliber.  Widely patent stents at completion with preserved three vessel runoff.   Right lower extremity arteriogram showed patent common femoral with focal occlusion of the profunda.  SFA has multiple segments of high-grade stenosis including in-stent stenosis.  Distally three vessel runoff.  Significant small vessel disease in the foot.             Procedure:  The patient was identified in the holding area and taken to room 8.  The patient was then placed supine on the table and prepped and draped in the usual sterile fashion.  A time out was called.  The patient received Versed and fentanyl for conscious moderate sedation.  Vital signs were monitored including heart rate, respiratory rate, oxygenation and blood pressure.  I was present for all of sedation.  Ultrasound was used to evaluate the right common femoral artery.  It was patent .  A digital ultrasound image was acquired.  A micropuncture needle was used to access the right common femoral artery under ultrasound guidance.  An 018 wire was advanced without resistance and a micropuncture sheath was placed.  The 018 wire was  removed and a benson wire was placed.  The micropuncture sheath was exchanged for a 5 french sheath.  An omniflush catheter was advanced over the wire to the level of L-1.  An abdominal angiogram was obtained.  The catheter was pulled down and bilateral lower extremity runoff was obtained.  Pertinent findings are noted above.  Ultimately elected to intervene on the occluded distal SFA above-knee popliteal stent in the left leg.  I used a Glidewire advantage and cannulated the left SFA after crossing the aortic bifurcation with a Omni Flush catheter.  We placed a long 7 French Catapault sheath in the right groin over the aortic bifurcation.  Patient was given 100 units/kg IV heparin.  I then used initially the penumbra lightening 7 and went down to perform percutaneous mechanical thrombectomy.  This catheter is very angulated and we had trouble getting it to track through the existing stents which I thought was likely due to a stent strut.  I then ultimately after multiple attempts trying to turn the catheter elected to use a different device and switched to the JETI thrombectomy device that is 6 [place] and straight catheter.  This was passed all the way down to the distal stent and we made several passes until we had a flow channel.  I then elected to realign this with a new 6 mm x 100 mm drug-coated Eluvia placed more distal into the above-knee popliteal artery.  I then performed repeat angioplasty of all of her existing SFA stents with long 5 mm angioplasty balloon and we used a 4 mm angioplasty balloon distally in the above-knee popliteal artery where the vessel was smaller.  She now has inline flow down the left SFA above-knee popliteal stents with preserved three-vessel runoff.  Significant small vessel disease in the foot.  Wires and catheters were removed.  Should be taken holding for sheath removal.   Plan: Continue aspirin Plavix statin.  Will arrange for right leg intervention in staged fashion for  in-stent stenosis.  Dr. Lajoyce Corners plans possible bilateral TMA's.  Remains high risk for limb loss.  Cephus Shelling, MD [facility] Office: [phone]   VAS [place] LOWER EXTREMITY ARTERIAL DUPLEX  Result Date: 07/31/2022 LOWER EXTREMITY ARTERIAL DUPLEX STUDY Patient Name:  Kayla Schmitt [facility]  Date of Exam:   07/31/2022 Medical Rec #: [id number]  Accession #:    [id number] Date of Birth: 13-Sep-1965                Patient Gender: F Patient Age:   43 years Exam Location:  [facility] Procedure:      VAS [place] LOWER EXTREMITY ARTERIAL DUPLEX Referring Phys: Lelon Mast RHYNE --------------------------------------------------------------------------------  Indications: Peripheral artery disease. Non-healing bilateral amputation sites. High Risk Factors: Hypertension, hyperlipidemia, Diabetes.  Vascular Interventions: 01/14/2020: Right SFA mechanical orbital atherectomy and                         balloon angioplasty. Right SFA stent placement. Right                         above-knee popliteal artery angioplasty.                         07/05/2022 Left SFA and above knee popliteal angioplasty                         and stent Current ABI:            R=Lyons, L=0.41 Performing Technologist: Dorthula Matas RVS, RCS  Examination Guidelines: A complete evaluation includes B-mode imaging, spectral Doppler, color Doppler, and power Doppler as needed of all accessible portions of each vessel. Bilateral testing is considered an integral part of a complete examination. Limited examinations for reoccurring indications may be performed as noted.   +----------+--------+-----+--------+--------+--------+ LEFT      PSV cm/sRatioStenosisWaveformComments +----------+--------+-----+--------+--------+--------+ CFA Distal143                  biphasic         +----------+--------+-----+--------+--------+--------+ PTA Mid   50                                     +----------+--------+-----+--------+--------+--------+ PTA Distal0                                     +----------+--------+-----+--------+--------+--------+  Left Stent(s): +------------------------+--------+--------+----------+--------+ SFA-above-knee poplitealPSV cm/sStenosisWaveform  Comments +------------------------+--------+--------+----------+--------+ Prox to Stent           100             monophasic         +------------------------+--------+--------+----------+--------+ Proximal Stent          109             monophasic         +------------------------+--------+--------+----------+--------+ Mid Stent               81              monophasic         +------------------------+--------+--------+----------+--------+ Distal Stent            0       occluded                   +------------------------+--------+--------+----------+--------+ Distal to Stent         14              monophasic         +------------------------+--------+--------+----------+--------+    Summary: Left: No color or spectral Doppler flow noted in the distal segment (  adductor canal) of the SFA stent. Minimal reconstitution of the popliteal artery.  See table(s) above for measurements and observations. Electronically signed by Sherald Hess MD on 07/31/2022 at 4:56:43 PM.    Final    VAS [place] ABI WITH/WO TBI  Result Date: 07/31/2022  LOWER EXTREMITY DOPPLER STUDY Patient Name:  Kayla Schmitt  Date of Exam:   07/31/2022 Medical Rec #: [id number]               Accession #:    [id number] Date of Birth: 1965-07-26                Patient Gender: F Patient Age:   39 years Exam Location:  [facility] Procedure:      VAS [place] ABI WITH/WO TBI Referring Phys: --------------------------------------------------------------------------------  Indications: Peripheral artery disease. Non-healing amputation sites. High Risk Factors: Hypertension, hyperlipidemia, Diabetes.  Vascular  Interventions: 01/14/2020: Right SFA mechanical orbital atherectomy and                         balloon angioplasty. Right SFA stent placement. Right                         above-knee popliteal artery angioplasty.                         07/05/2022 Left SFA and above-knee popliteal angioplasty                         with stent placement 07/20/2022 Right great toe and                         second toe amputation and left great toe amputation. Performing Technologist: Dorthula Matas RVS, RCS  Examination Guidelines: A complete evaluation includes at minimum, Doppler waveform signals and systolic blood pressure reading at the level of bilateral brachial, anterior tibial, and posterior tibial arteries, when vessel segments are accessible. Bilateral testing is considered an integral part of a complete examination. Photoelectric Plethysmograph (PPG) waveforms and toe systolic pressure readings are included as required and additional duplex testing as needed. Limited examinations for reoccurring indications may be performed as noted.  ABI Findings: +--------+------------------+-----+----------+----------------+ Right   Rt Pressure (mmHg)IndexWaveform  Comment          +--------+------------------+-----+----------+----------------+ [id number]                                               +--------+------------------+-----+----------+----------------+ PTA                            monophasicnon-compressible +--------+------------------+-----+----------+----------------+ DP      75                0.52 monophasic                 +--------+------------------+-----+----------+----------------+ +--------+------------------+-----+----------+-------+ Left    Lt Pressure (mmHg)IndexWaveform  Comment +--------+------------------+-----+----------+-------+ [id number]                                      +--------+------------------+-----+----------+-------+ PTA  absent            +--------+------------------+-----+----------+-------+ DP      58                0.41 monophasic        +--------+------------------+-----+----------+-------+ +-------+-----------+-----------+---------------------------------+------------+ ABI/TBIToday's ABIToday's TBIPrevious ABI                     Previous TBI +-------+-----------+-----------+---------------------------------+------------+ Right  [place]         amputated  reported normal in error-was     0.28                                      non-compressible                              +-------+-----------+-----------+---------------------------------+------------+ Left   0.41       amputation 0.51                             bandage      +-------+-----------+-----------+---------------------------------+------------+  Left ABIs appear decreased. Right ABI comparison not appropriate due to non-compressbile vessels. Previous waveform analysis reported as multiphasic in error and was actually monophasic. Waveform remains monophasic.  Summary: Right: Resting right ankle-brachial index indicates noncompressible right lower extremity arteries. Left: Resting left ankle-brachial index indicates severe left lower extremity arterial disease. *See table(s) above for measurements and observations.  Electronically signed by Sherald Hess MD on 07/31/2022 at 4:55:56 PM.    Final     Subjective: Patient was seen and examined at bedside.  Overnight events noted.   Patient report doing much better.  TEE completed.  Patient is cleared from cardiology to be discharged.  Discharge Exam: Vitals:   08/27/22 0940 08/27/22 1219  BP: (!) 122/58 115/69  Pulse: 73 72  Resp: 16 17  Temp:  98 F (36.7 C)  SpO2: 100% 98%   Vitals:   08/27/22 0920 08/27/22 0930 08/27/22 0940 08/27/22 1219  BP: (!) 107/55 (!) 108/56 (!) 122/58 115/69  Pulse: 73 75 73 72  Resp: 20 19 16 17   Temp: 98 F (36.7 C)   98 F (36.7 C)   TempSrc: Temporal   Oral  SpO2: 94% 95% 100% 98%  Weight:      Height:        General: Pt is alert, awake, not in acute distress Cardiovascular: RRR, S1/S2 +, no rubs, no gallops Respiratory: CTA bilaterally, no wheezing, no rhonchi Abdominal: Soft, NT, ND, bowel sounds + Extremities: no edema, no cyanosis, bilateral metatarsal amputation.    The results of significant diagnostics from this hospitalization (including imaging, microbiology, ancillary and laboratory) are listed below for reference.     Microbiology: No results found for this or any previous visit (from the past 240 hour(s)).   Labs: BNP (last 3 results) Recent Labs    08/18/22 1344  BNP 486.0*   Basic Metabolic Panel: Recent Labs  Lab 08/22/22 0213 08/23/22 0252 08/23/22 0920 08/23/22 0925 08/23/22 0926 08/24/22 0309 08/25/22 0235 08/25/22 1424  NA 137 140   < > 138 138 136 138 136  K 4.7 4.6   < > 4.4 4.4 3.9 3.8 3.9  CL 104 101  --   --   --  99 97* 96*  CO2 27 27  --   --   --  27 30 28   GLUCOSE 125* 144*  --   --   --  111* 138* 132*  BUN 28* 26*  --   --   --  23* 25* 22*  CREATININE 1.17* 1.21*  --   --   --  1.09* 1.26* 1.28*  CALCIUM 8.6* 8.7*  --   --   --  8.4* 8.8* 9.0  MG  --  1.7  --   --   --  1.7 1.9  --   PHOS  --  4.7*  --   --   --  4.6 5.1*  --    < > = values in this interval not displayed.   Liver Function Tests: No results for input(s): "AST", "ALT", "ALKPHOS", "BILITOT", "PROT", "ALBUMIN" in the last 168 hours. No results for input(s): "LIPASE", "AMYLASE" in the last 168 hours. No results for input(s): "AMMONIA" in the last 168 hours. CBC: Recent Labs  Lab 08/21/22 0321 08/22/22 0213 08/23/22 0252 08/23/22 0920 08/23/22 0925 08/23/22 0926 08/24/22 0309 08/25/22 0235  WBC 6.6 5.8 5.1  --   --   --  4.6 4.9  HGB 7.5* 7.5* 7.4* 7.8* 8.2* 7.8* 7.7* 7.8*  HCT 24.1* 24.6* 23.2* 23.0* 24.0* 23.0* 24.4* 25.9*  MCV 89.9 91.8 88.9  --   --   --  89.1 91.5  PLT 305 301  291  --   --   --  298 327   Cardiac Enzymes: No results for input(s): "CKTOTAL", "CKMB", "CKMBINDEX", "TROPONINI" in the last 168 hours. BNP: Invalid input(s): "POCBNP" CBG: Recent Labs  Lab 08/26/22 1550 08/26/22 2119 08/27/22 0740 08/27/22 0922 08/27/22 1218  GLUCAP 147* 213* 100* 123* 200*   D-Dimer No results for input(s): "DDIMER" in the last 72 hours. Hgb A1c No results for input(s): "HGBA1C" in the last 72 hours. Lipid Profile No results for input(s): "CHOL", "HDL", "LDLCALC", "TRIG", "CHOLHDL", "LDLDIRECT" in the last 72 hours. Thyroid function studies No results for input(s): "TSH", "T4TOTAL", "T3FREE", "THYROIDAB" in the last 72 hours.  Invalid input(s): "FREET3" Anemia work up No results for input(s): "VITAMINB12", "FOLATE", "FERRITIN", "TIBC", "IRON", "RETICCTPCT" in the last 72 hours. Urinalysis    Component Value Date/Time   COLORURINE YELLOW 01/11/2020 1150   APPEARANCEUR HAZY (A) 01/11/2020 1150   LABSPEC 1.021 01/11/2020 1150   PHURINE 5.0 01/11/2020 1150   GLUCOSEU >=500 (A) 01/11/2020 1150   HGBUR NEGATIVE 01/11/2020 1150   BILIRUBINUR NEGATIVE 01/11/2020 1150   KETONESUR NEGATIVE 01/11/2020 1150   PROTEINUR >=300 (A) 01/11/2020 1150   NITRITE NEGATIVE 01/11/2020 1150   LEUKOCYTESUR NEGATIVE 01/11/2020 1150   Sepsis Labs Recent Labs  Lab 08/22/22 0213 08/23/22 0252 08/24/22 0309 08/25/22 0235  WBC 5.8 5.1 4.6 4.9   Microbiology No results found for this or any previous visit (from the past 240 hour(s)).   Time coordinating discharge: Over 30 minutes  SIGNED:   Willeen Niece, MD  [facility] 08/27/2022, 2:07 PM Pager   If 7PM-7AM, please contact night-coverage

## 2022-08-27 NOTE — Evaluation (Addendum)
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 Admitted for NSTEMI with systolic heart failure, LHC revealing severe calcified 3 vessel CAD. PMH includes DM2, HTN, hypothyroidism, PAD on antiplatelet therapy, GERD, chronic anemia.   Clinical Impression   Pt reports needing assist at baseline from family, family brings meals to her as pt unable to ambulate at this time from prior surgery. Pt reports at home she transfers to w/c and to [facility], does not use transfer board as pt states she has been cleared for pivot transfers with darco shoes donned per ortho MD. Pt needing set up - min guard overall for ADLs and transfers. Pt able to verbalize precautions. Pt presenting with impairments listed below, will follow acutely. Recommend HHOT at d/c.     Recommendations for follow up therapy are one component of a multi-disciplinary discharge planning process, led by the attending physician.  Recommendations may be updated based on patient status, additional functional criteria and insurance authorization.   Assistance Recommended at Discharge Set up Supervision/Assistance  Patient can return home with the following A little help with bathing/dressing/bathroom;Assist for transportation;Help with stairs or ramp for entrance    Functional Status Assessment  Patient has had a recent decline in their functional status and demonstrates the ability to make significant improvements in function in a reasonable and predictable amount of time.  Equipment Recommendations  None recommended by OT (pt has all needed DME)    Recommendations for Other Services PT consult     Precautions / Restrictions Restrictions Weight Bearing Restrictions: Yes RLE Weight Bearing: Non weight  bearing LLE Weight Bearing: Non weight bearing Other Position/Activity Restrictions: Per ortho on previous admission for bilateral transmet surgery 4/10: ideally NWB, can WB through heel in [facility] shoes if necessary for transfers only      Mobility Bed Mobility               General bed mobility comments: seated EOB upon arrival and departure    Transfers Overall transfer level: Needs assistance   Transfers: Bed to chair/wheelchair/BSC     Squat pivot transfers: Min guard       General transfer comment: pt reports per MD Lajoyce Corners she was cleared for pivot transfers  with darco shoes donned, verbalizes need to keep weight in heels      Balance Overall balance assessment: Needs assistance Sitting-balance support: Feet supported, No upper extremity supported Sitting balance-Leahy Scale: Good                                     ADL either performed or assessed with clinical judgement   ADL Overall ADL's : Needs assistance/impaired Eating/Feeding: Set up;Sitting   Grooming: Set up;Sitting   Upper Body Bathing: Set up;Sitting   Lower Body Bathing: Min guard;Sitting/lateral leans   Upper Body Dressing : Min guard;Sitting   Lower Body Dressing: Set up;Sitting/lateral leans   Toilet Transfer: Min guard;Squat-pivot;BSC/3in1   Toileting- Clothing Manipulation and Hygiene: Min guard       Functional mobility during ADLs: Min guard       Vision   Vision Assessment?: No apparent visual deficits     Perception Perception Perception Tested?: No   Praxis Praxis Praxis tested?:  Not tested    Pertinent Vitals/Pain Pain Assessment Pain Assessment: No/denies pain     Hand Dominance Right   Extremity/Trunk Assessment Upper Extremity Assessment Upper Extremity Assessment: Overall WFL for tasks assessed   Lower Extremity Assessment Lower Extremity Assessment: Defer to PT evaluation   Cervical / Trunk Assessment Cervical / Trunk Assessment:  Normal   Communication Communication Communication: No difficulties   Cognition Arousal/Alertness: Awake/alert Behavior During Therapy: WFL for tasks assessed/performed Overall Cognitive Status: Within Functional Limits for tasks assessed                                 General Comments: pt with good understanding of precautions and condition     General Comments  VSS on RA, family present in room    Exercises     Shoulder Instructions      Home Living Family/patient expects to be discharged to:: Private residence Living Arrangements: Alone Available Help at Discharge: Family;Available PRN/intermittently Type of Home: House Home Access: Ramped entrance     Home Layout: One level     Bathroom Shower/Tub: Producer, television/film/video: Handicapped height     Home Equipment: Wheelchair - Forensic psychologist (2 wheels);Other (comment);Shower seat;BSC/3in1 Building services engineer)          Prior Functioning/Environment Prior Level of Function : Independent/Modified Independent             Mobility Comments: ind with transfers; pt reports she does not use slide board at home, reports she has been cleared for pivot transfers by Lajoyce Corners MD and that is how she has been transferring at home ADLs Comments: reports at home she cannot get into the bathroom with w/c, uses BSC        OT Problem List: Decreased strength;Decreased range of motion;Decreased activity tolerance;Impaired balance (sitting and/or standing)      OT Treatment/Interventions: Self-care/ADL training;Therapeutic exercise;Energy conservation    OT Goals(Current goals can be found in the care plan section) Acute Rehab OT Goals Patient Stated Goal: none stated OT Goal Formulation: With patient Time For Goal Achievement: 09/10/22 Potential to Achieve Goals: Good ADL Goals Pt Will Perform Lower Body Dressing: with modified independence;sitting/lateral leans;bed level Pt Will Perform Tub/Shower  Transfer: Tub transfer;Shower transfer;with modified independence;shower seat  OT Frequency: Min 1X/week    Co-evaluation              AM-PAC OT "6 Clicks" Daily Activity     Outcome Measure Help from another person eating meals?: None Help from another person taking care of personal grooming?: None Help from another person toileting, which includes using toliet, bedpan, or urinal?: A Little Help from another person bathing (including washing, rinsing, drying)?: A Little Help from another person to put on and taking off regular upper body clothing?: A Little Help from another person to put on and taking off regular lower body clothing?: A Little 6 Click Score: 20   End of Session Nurse Communication: Mobility status  Activity Tolerance: Patient tolerated treatment well Patient left: in bed;with call bell/phone within reach;with family/visitor present  OT Visit Diagnosis: Unsteadiness on feet (R26.81);Other abnormalities of gait and mobility (R26.89);Muscle weakness (generalized) (M62.81)                Time: 2130-8657 OT Time Calculation (min): 18 min Charges:  OT General Charges $OT Visit: 1 Visit OT Evaluation $OT Eval Low Complexity: 1 Low  Camreigh Michie K, OTD, OTR/L SecureChat  Preferred Acute Rehab [phone]   Carver Fila Koonce 08/27/2022, 2:03 PM

## 2022-08-27 NOTE — Anesthesia Postprocedure Evaluation (Signed)
Anesthesia Post Note  Patient: Kayla Schmitt  Procedure(s) Performed: TRANSESOPHAGEAL ECHOCARDIOGRAM     Patient location during evaluation: PACU Anesthesia Type: MAC Level of consciousness: awake and alert Pain management: pain level controlled Vital Signs Assessment: post-procedure vital signs reviewed and stable Respiratory status: spontaneous breathing, nonlabored ventilation, respiratory function stable and patient connected to nasal cannula oxygen Cardiovascular status: stable and blood pressure returned to baseline Postop Assessment: no apparent nausea or vomiting Anesthetic complications: no  No notable events documented.  Last Vitals:  Vitals:   08/27/22 0930 08/27/22 0940  BP: (!) 108/56 (!) 122/58  Pulse: 75 73  Resp: 19 16  Temp:    SpO2: 95% 100%    Last Pain:  Vitals:   08/27/22 0940  TempSrc:   PainSc: 0-No pain                 Brandilee Pies L Alberta Cairns

## 2022-08-27 NOTE — Discharge Instructions (Signed)

## 2022-08-27 NOTE — Interval H&P Note (Signed)
History and Physical Interval Note:  08/27/2022 7:42 AM  Kayla Schmitt  has presented today for surgery, with the diagnosis of SEVERE MITRAL REGURGITATION.  The various methods of treatment have been discussed with the patient and family. After consideration of risks, benefits and other options for treatment, the patient has consented to  Procedure(s): TRANSESOPHAGEAL ECHOCARDIOGRAM (N/A) as a surgical intervention.  The patient's history has been reviewed, patient examined, no change in status, stable for surgery.  I have reviewed the patient's chart and labs.  Questions were answered to the patient's satisfaction.     [name]

## 2022-08-27 NOTE — Progress Notes (Signed)
   Heart Failure Stewardship Pharmacist Progress Note   PCP: [facility], [facility] PCP-Cardiologist: Jodelle Red, MD    HPI:  57 yo F with PMH of T2DM, HTN, hypothyroidism, PAD, GERD and chronic anemia.   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 She had increased bleeding from the surgical sites after discharge.   Presented to the ED on 4/20 with shortness of breath and orthopnea over the last 3 days. CXR showed bilateral interstitial changes with small pleural effusions. CTA showed no evidence of a PE but was noted to have pulmonary edema with small to moderate layering bilateral effusions and probable compressive atelectasis versus consolidation in the lung bases. She was transferred from [facility] to [facility] for further evaluation. Troponin elevation secondary to demand ischemia. ECHO 4/21 with LVEF 40-45%, regional wall motion abnormalities, mild LVH, RV normal. Taken for cath and found to have severe three vessel CAD and severe MR, recommending CABG evaluation. RA 9, PA 32, wedge 21, CO 8.6, CI 4.3.  TEE today to further assess mitral valve. Plan CABG in 3-4 weeks.  Current HF Medications: Diuretic: furosemide 40 mg PO BID Beta Blocker: metoprolol XL 12.5 mg daily SGLT2i: Jardiance 10 mg daily  Prior to admission HF Medications: None *also on amlodipine 10 mg daily  Pertinent Lab Values: Serum creatinine 1.28, BUN 22, Potassium 3.9, Sodium 136, Magnesium 1.9, BNP 486, A1c 6.4   Vital Signs: Weight: 200 lbs (admission weight: 222 lbs) Blood pressure: 120/60s  Heart rate: 70s  I/O: -2.1L yesterday; net -8.4L  Medication Assistance / Insurance Benefits Check: Does the patient have prescription insurance?  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 Yes Is the patient willing to  transition their outpatient pharmacy to utilize a [facility] outpatient pharmacy?   Pending    Assessment: 1. 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 NYHA class III symptoms. - Continue furosemide 40 mg PO BID. Strict I/Os and daily weights. Keep K>4 and Mg>2. - Continue metoprolol XL 12.5 mg daily - Consider adding ARB prior to discharge vs at follow up - Consider starting spironolactone 12.5 mg daily - Continue Jardiance 10 mg daily - Agree with holding amlodipine, would not resume at discharge   Plan: 1) Medication changes recommended at this time: - Start spironolactone 12.5 mg daily  2) Patient assistance: - Farxiga copay $25 - copay card lowers to $0 per month - Jardiance copay $25 - copay card lowers to $10 per month - Entresto copay $30 - copay card lowers to $10 per fill (30-90 day supply)  3)  Education  - Patient has been educated on current HF medications and potential additions to HF medication regimen - Patient verbalizes understanding that over the next few months, these medication doses may change and more medications may be added to optimize HF regimen - Patient has been educated on basic disease state pathophysiology and goals of therapy   Sharen Hones, PharmD, BCPS Heart Failure Stewardship Pharmacist Phone [phone]

## 2022-08-27 NOTE — TOC Transition Note (Signed)
Transition of Care [facility]) - CM/SW Discharge Note   Patient Details  Name: Kayla Schmitt MRN: [id number] Date of Birth: 07/08/1965  Transition of Care [facility]) CM/SW Contact:  Gala Lewandowsky, RN Phone Number: 08/27/2022, 1:19 PM   Clinical Narrative: Patient will transition home today. Patient is active with [facility] for home health physical therapy. Resumption orders to be written by the provider. No other home needs identified at this time.     Final next level of care: Home w Home Health Services Barriers to Discharge: No Barriers Identified  Discharge Plan and Services Additional resources added to the After Visit Summary   In-house Referral: NA Discharge Planning Services: CM Consult Post Acute Care Choice: Home Health, Resumption of Svcs/PTA Provider            DME Agency: NA    HH Arranged: PT HH Agency: [facility] Date [facility] Agency Contacted: 08/24/22 Time HH Agency Contacted: 1519 Representative spoke with at [facility] Agency: Bjorn Loser  Social Determinants of Health (SDOH) Interventions SDOH Screenings   Food Insecurity: No Food Insecurity (08/20/2022)  Housing: Low Risk  (08/20/2022)  Transportation Needs: No Transportation Needs (08/20/2022)  Utilities: Not At Risk (08/20/2022)  Tobacco Use: Medium Risk (08/27/2022)   Readmission Risk Interventions    08/14/2022    4:37 PM  Readmission Risk Prevention Plan  Post Dischage Appt Complete  Medication Screening Complete  Transportation Screening Complete

## 2022-08-27 NOTE — CV Procedure (Signed)
     Transesophageal Echocardiogram Note  Kayla Schmitt [id number] [birth year]  Procedure: Transesophageal Echocardiogram Indications: severe mitral regurgitation  Procedure Details Consent: Obtained Time Out: Verified patient identification, verified procedure, site/side was marked, verified correct patient position, special equipment/implants available, Radiology Safety Procedures followed,  medications/allergies/relevent history reviewed, required imaging and test results available.  Performed  Medications: Propofol: 323.6mg   Left Ventrical:  LVEF 40-45%   Mitral Valve: The mitral valve is degenerative. There is tethering of the posterior leaflet with moderate mitral regurgitation (2 jets present: one more posteriorly directed and one more centrally directed). There is normal pulmonary vein flow with systolic predominance. SBP 110s at time of procedure  Aortic Valve: Tricuspid with moderate calcification of the AoV leaflets. There is moderate aortic stenosis with mean gradient .  Tricuspid Valve: Normal structure. Trivial TR  Pulmonic Valve: Normal structure. Trivial PR  Left Atrium/ Left atrial appendage: No LAA thrombus  Atrial septum: No PFO on agitated saline contrast study  Aorta: grade III plaque   Complications: No apparent complications Patient did tolerate procedure well.  Kayla Sprague, MD 08/27/2022, 9:16 AM

## 2022-08-28 ENCOUNTER — Telehealth: Payer: Self-pay

## 2022-08-28 NOTE — Telephone Encounter (Signed)
Kayla Schmitt, PT with [name] called stating she is d/c from PT at this time. Pt is doing well with transfers from bed to chair to potty and there is no more that can be done until after her next procedure. Pt wants to reserve the remainder of her benefits for future needs.

## 2022-09-06 ENCOUNTER — Ambulatory Visit: Payer: Managed Care, Other (non HMO) | Admitting: Orthopedic Surgery

## 2022-09-06 ENCOUNTER — Encounter: Payer: Self-pay | Admitting: Orthopedic Surgery

## 2022-09-06 DIAGNOSIS — Z89439 Acquired absence of unspecified foot: Secondary | ICD-10-CM

## 2022-09-06 NOTE — Progress Notes (Signed)
Office Visit Note   Patient: Kayla Schmitt           Date of Birth: 07/09/1965           MRN: [id number] Visit Date: 09/06/2022              Requested by: Medicine, [facility] [address] [place],  [place] [address] PCP: Medicine, [facility]  Chief Complaint  Patient presents with   Right Foot - Routine Post Op    08/08/2022 bilat transmet amputations    Left Foot - Routine Post Op      HPI: Post bilateral transmetatarsal amputation. Patient is a 57 year old woman who presents 4 weeks status  Assessment & Plan: Visit Diagnoses:  1. History of transmetatarsal amputation of foot (HCC)     Plan: Will harvest the sutures today patient is provided a prescription for [employer] for extra-depth shoes custom orthotics spacers and carbon plate bilaterally.  Patient will also need the stump shrinker sock to help decrease the swelling.  Patient may also require double upright bracing.  Patient may increase her activities as tolerated at this time recommended against driving.  A walker should help with her balance.  Follow-Up Instructions: Return in about 4 weeks (around 10/04/2022).   Ortho Exam  Patient is alert, oriented, no adenopathy, well-dressed, normal affect, normal respiratory effort. Examination the incisions are healing nicely.  There is no redness cellulitis or drainage.  Sutures are harvested today.  Imaging: No results found.     Labs: Lab Results  Component Value Date   HGBA1C 6.4 (H) 08/02/2022   HGBA1C 11.0 (H) 01/11/2020   ESRSEDRATE 136 (H) 01/14/2020   ESRSEDRATE 126 (H) 01/13/2020   ESRSEDRATE 125 (H) 01/12/2020   CRP 11.3 (H) 01/14/2020   CRP 13.0 (H) 01/13/2020   CRP 15.8 (H) 01/12/2020   LABURIC 7.7 (H) 02/04/2022   REPTSTATUS 08/07/2022 FINAL 08/02/2022   CULT  08/02/2022    NO GROWTH 5 DAYS Performed at [facility], [address], [place], [place] [address]      Lab Results  Component Value Date   ALBUMIN 1.6 (L) 08/06/2022    ALBUMIN 1.7 (L) 08/05/2022   ALBUMIN 1.9 (L) 08/04/2022    Lab Results  Component Value Date   MG 1.9 08/25/2022   MG 1.7 08/24/2022   MG 1.7 08/23/2022   No results found for: "VD25OH"  No results found for: "PREALBUMIN"    Latest Ref Rng & Units 08/25/2022    2:35 AM 08/24/2022    3:09 AM 08/23/2022    9:26 AM  CBC EXTENDED  WBC 4.0 - 10.5 K/uL 4.9  4.6    RBC 3.87 - 5.11 MIL/uL 2.83  2.74    Hemoglobin 12.0 - 15.0 g/dL 7.8  7.7  7.8   HCT 28.4 - 46.0 % 25.9  24.4  23.0   Platelets 150 - 400 K/uL 327  298       There is no height or weight on file to calculate BMI.  Orders:  No orders of the defined types were placed in this encounter.  No orders of the defined types were placed in this encounter.    Procedures: No procedures performed  Clinical Data: No additional findings.  ROS:  All other systems negative, except as noted in the HPI. Review of Systems  Objective: Vital Signs: There were no vitals taken for this visit.  Specialty Comments:  No specialty comments available.  PMFS History: Patient Active  Problem List   Diagnosis Date Noted   Severe mitral regurgitation by prior echocardiogram 08/27/2022   NSTEMI (non-ST elevated myocardial infarction) (HCC) 08/18/2022   Acute clinical systolic heart failure (HCC) 08/18/2022   Acute blood loss anemia 08/18/2022   Anemia 08/18/2022   Critical limb ischemia of both lower extremities (HCC) 06/19/2022   DM type 2 (diabetes mellitus, type 2) (HCC) 01/28/2020   Hyperlipidemia 01/28/2020   Hypertension 01/28/2020   Hypothyroidism 01/28/2020   Obesity 01/28/2020   Ulcerated, foot, right, limited to breakdown of skin (HCC)    Cellulitis of right foot    Peripheral arterial disease (HCC)    Cellulitis 01/11/2020   Blister of leg 01/06/2020   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/28/2017   Chest pain 10/28/2017   GERD (gastroesophageal reflux disease) 10/28/2017   Past Medical History:  Diagnosis Date    Arthritis    Critical limb ischemia of both lower extremities (HCC)    Diabetes mellitus without complication (HCC)    type 2   GERD (gastroesophageal reflux disease)    Hypercholesteremia    Hypertension    Hypothyroidism    PAD (peripheral artery disease) (HCC)    PONV (postoperative nausea and vomiting)    Thyroid disease     Family History  Problem Relation Age of Onset   Diabetes Other    CAD Other     Past Surgical History:  Procedure Laterality Date   ABDOMINAL AORTOGRAM W/LOWER EXTREMITY Bilateral 01/14/2020   Procedure: ABDOMINAL AORTOGRAM W/LOWER EXTREMITY;  Surgeon: Cephus Shelling, MD;  Location: [facility];  Service: Cardiovascular;  Laterality: Bilateral;   ABDOMINAL AORTOGRAM W/LOWER EXTREMITY N/A 07/05/2022   Procedure: ABDOMINAL AORTOGRAM W/LOWER EXTREMITY;  Surgeon: Cephus Shelling, MD;  Location: [facility];  Service: Cardiovascular;  Laterality: N/A;   ABDOMINAL AORTOGRAM W/LOWER EXTREMITY N/A 08/02/2022   Procedure: ABDOMINAL AORTOGRAM W/LOWER EXTREMITY;  Surgeon: Cephus Shelling, MD;  Location: [facility];  Service: Cardiovascular;  Laterality: N/A;   ABDOMINAL AORTOGRAM W/LOWER EXTREMITY N/A 08/06/2022   Procedure: ABDOMINAL AORTOGRAM W/LOWER EXTREMITY;  Surgeon: Cephus Shelling, MD;  Location: [facility];  Service: Cardiovascular;  Laterality: N/A;   AMPUTATION Bilateral 07/20/2022   Procedure: AMPUTATION RIGHT GREAT TOE AND SECOND TOE, AMPUTATION LEFT GREAT TOE;  Surgeon: Nadara Mustard, MD;  Location: [facility] OR;  Service: Orthopedics;  Laterality: Bilateral;   AMPUTATION Bilateral 08/08/2022   Procedure: BILATERAL TRANSMETATARSAL AMPUTATION;  Surgeon: Nadara Mustard, MD;  [facility];  Service: Orthopedics;  Laterality: Bilateral;   AMPUTATION TOE     PERIPHERAL VASCULAR ATHERECTOMY Right 01/14/2020   Procedure: PERIPHERAL VASCULAR ATHERECTOMY;  Surgeon: Cephus Shelling, MD;  Location: [facility] INVASIVE CV LAB;   Service: Cardiovascular;  Laterality: Right;  sfa   PERIPHERAL VASCULAR INTERVENTION Right 01/14/2020   Procedure: PERIPHERAL VASCULAR INTERVENTION;  Surgeon: Cephus Shelling, MD;  Location: [facility];  Service: Cardiovascular;  Laterality: Right;  sfa stent    PERIPHERAL VASCULAR INTERVENTION  07/05/2022   Procedure: PERIPHERAL VASCULAR INTERVENTION;  Surgeon: Cephus Shelling, MD;  Location: [facility];  Service: Cardiovascular;;   PERIPHERAL VASCULAR INTERVENTION  08/02/2022   Procedure: PERIPHERAL VASCULAR INTERVENTION;  Surgeon: Cephus Shelling, MD;  Location: [facility];  Service: Cardiovascular;;   PERIPHERAL VASCULAR INTERVENTION  08/06/2022   Procedure: PERIPHERAL VASCULAR INTERVENTION;  Surgeon: Cephus Shelling, MD;  Location: [facility] INVASIVE CV LAB;  Service: Cardiovascular;;  PERIPHERAL VASCULAR THROMBECTOMY  08/02/2022   Procedure: PERIPHERAL VASCULAR THROMBECTOMY;  Surgeon: Cephus Shelling, MD;  Location: [facility];  Service: Cardiovascular;;   RIGHT/LEFT HEART CATH AND CORONARY ANGIOGRAPHY N/A 08/23/2022   Procedure: RIGHT/LEFT HEART CATH AND CORONARY ANGIOGRAPHY;  Surgeon: [name], MD;  Location: [facility] INVASIVE CV LAB;  Service: Cardiovascular;  Laterality: N/A;   SKIN SPLIT GRAFT Right 03/18/2020   Procedure: SKIN GRAFTING RIGHT FOOT ULCER;  Surgeon: Nadara Mustard, MD;  Location: [facility] OR;  Service: Orthopedics;  Laterality: Right;   TEE WITHOUT CARDIOVERSION N/A 08/27/2022   Procedure: TRANSESOPHAGEAL ECHOCARDIOGRAM;  Surgeon: [name], MD;  Location: [facility] INVASIVE CV LAB;  Service: Cardiovascular;  Laterality: N/A;   WISDOM TOOTH EXTRACTION     Social History   Occupational History   Not on file  Tobacco Use   Smoking status: Former    Types: Cigarettes    Quit date: 01/2017    Years since quitting: 5.6   Smokeless tobacco: Never  Vaping Use   Vaping Use: Never used  Substance and Sexual Activity    Alcohol use: Not Currently   Drug use: Yes    Types: Marijuana   Sexual activity: Yes    Birth control/protection: Post-menopausal

## 2022-09-07 ENCOUNTER — Telehealth: Payer: Self-pay

## 2022-09-07 NOTE — Telephone Encounter (Signed)
Patient called concerning right incision on right foot. Stated that incision had opened up and was bleeding due to some light waling that she had done on yesterday.  Talked with Autumn F., Dr. Audrie Lia assistant and was advised to let patient know to apply a dry dressing and not walk on right foot at all and F/U on Monday, 09/10/2022.    Advised patient of message of above per Autumn F., appointment scheduled and patient voiced that she understands.

## 2022-09-10 ENCOUNTER — Encounter: Payer: Self-pay | Admitting: Orthopedic Surgery

## 2022-09-10 ENCOUNTER — Ambulatory Visit (INDEPENDENT_AMBULATORY_CARE_PROVIDER_SITE_OTHER): Payer: Managed Care, Other (non HMO) | Admitting: Orthopedic Surgery

## 2022-09-10 DIAGNOSIS — Z89439 Acquired absence of unspecified foot: Secondary | ICD-10-CM

## 2022-09-10 NOTE — Progress Notes (Signed)
Office Visit Note   Patient: Kayla Schmitt           Date of Birth: [birth year]           MRN: [id number] Visit Date: 09/10/2022              Requested by: Medicine, [facility] Internal [address] [place],  [place] [address] PCP: Medicine, [facility] Internal  Chief Complaint  Patient presents with   Right Foot - Routine Post Op    08/08/2022 bilateral transmet amputations    Left Foot - Routine Post Op      HPI: Patient is a 57 year old woman status post bilateral transmetatarsal amputations 4 weeks ago.  Sutures were removed last week.  Patient states she has had progressive wound dehiscence of the right foot.  Patient states she has been active with walking and driving.  Assessment & Plan: Visit Diagnoses:  1. History of transmetatarsal amputation of foot (HCC)     Plan: Will decrease activities to minimize weightbearing.  She will cleanse the wound with soap and water and alternate between Bactroban and Silvadene dressing changes.  Follow-Up Instructions: Return in about 2 weeks (around 09/24/2022).   Ortho Exam  Patient is alert, oriented, no adenopathy, well-dressed, normal affect, normal respiratory effort. Examination the left transmetatarsal amputation is healing well right foot has wound dehiscence laterally with a hematoma.  There is no cellulitis wound measures 1 x 4 cm.  Imaging: No results found.   Labs: Lab Results  Component Value Date   HGBA1C 6.4 (H) 08/02/2022   HGBA1C 11.0 (H) 01/11/2020   ESRSEDRATE 136 (H) 01/14/2020   ESRSEDRATE 126 (H) 01/13/2020   ESRSEDRATE 125 (H) 01/12/2020   CRP 11.3 (H) 01/14/2020   CRP 13.0 (H) 01/13/2020   CRP 15.8 (H) 01/12/2020   LABURIC 7.7 (H) 02/04/2022   REPTSTATUS 08/07/2022 FINAL 08/02/2022   CULT  08/02/2022    NO GROWTH 5 DAYS Performed at [facility], [address], [place], [place] [address]      Lab Results  Component Value Date   ALBUMIN 1.6 (L) 08/06/2022   ALBUMIN 1.7 (L) 08/05/2022    ALBUMIN 1.9 (L) 08/04/2022    Lab Results  Component Value Date   MG 1.9 08/25/2022   MG 1.7 08/24/2022   MG 1.7 08/23/2022   No results found for: "VD25OH"  No results found for: "PREALBUMIN"    Latest Ref Rng & Units 08/25/2022    2:35 AM 08/24/2022    3:09 AM 08/23/2022    9:26 AM  CBC EXTENDED  WBC 4.0 - 10.5 K/uL 4.9  4.6    RBC 3.87 - 5.11 MIL/uL 2.83  2.74    Hemoglobin 12.0 - 15.0 g/dL 7.8  7.7  7.8   HCT 86.5 - 46.0 % 25.9  24.4  23.0   Platelets 150 - 400 K/uL 327  298       There is no height or weight on file to calculate BMI.  Orders:  No orders of the defined types were placed in this encounter.  No orders of the defined types were placed in this encounter.    Procedures: No procedures performed  Clinical Data: No additional findings.  ROS:  All other systems negative, except as noted in the HPI. Review of Systems  Objective: Vital Signs: There were no vitals taken for this visit.  Specialty Comments:  No specialty comments available.  PMFS History: Patient Active Problem List   Diagnosis Date  Noted   Severe mitral regurgitation by prior echocardiogram 08/27/2022   NSTEMI (non-ST elevated myocardial infarction) (HCC) 08/18/2022   Acute clinical systolic heart failure (HCC) 08/18/2022   Acute blood loss anemia 08/18/2022   Anemia 08/18/2022   Critical limb ischemia of both lower extremities (HCC) 06/19/2022   DM type 2 (diabetes mellitus, type 2) (HCC) 01/28/2020   Hyperlipidemia 01/28/2020   Hypertension 01/28/2020   Hypothyroidism 01/28/2020   Obesity 01/28/2020   Ulcerated, foot, right, limited to breakdown of skin (HCC)    Cellulitis of right foot    Peripheral arterial disease (HCC)    Cellulitis 01/11/2020   Blister of leg 01/06/2020   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/28/2017   Chest pain 10/28/2017   GERD (gastroesophageal reflux disease) 10/28/2017   Past Medical History:  Diagnosis Date   Arthritis    Critical  limb ischemia of both lower extremities (HCC)    Diabetes mellitus without complication (HCC)    type 2   GERD (gastroesophageal reflux disease)    Hypercholesteremia    Hypertension    Hypothyroidism    PAD (peripheral artery disease) (HCC)    PONV (postoperative nausea and vomiting)    Thyroid disease     Family History  Problem Relation Age of Onset   Diabetes Other    CAD Other     Past Surgical History:  Procedure Laterality Date   ABDOMINAL AORTOGRAM W/LOWER EXTREMITY Bilateral 01/14/2020   Procedure: ABDOMINAL AORTOGRAM W/LOWER EXTREMITY;  Surgeon: Cephus Shelling, MD;  [facility]: [facility];  Service: Cardiovascular;  Laterality: Bilateral;   ABDOMINAL AORTOGRAM W/LOWER EXTREMITY N/A 07/05/2022   Procedure: ABDOMINAL AORTOGRAM W/LOWER EXTREMITY;  Surgeon: Cephus Shelling, MD;  Location: [facility];  Service: Cardiovascular;  Laterality: N/A;   ABDOMINAL AORTOGRAM W/LOWER EXTREMITY N/A 08/02/2022   Procedure: ABDOMINAL AORTOGRAM W/LOWER EXTREMITY;  Surgeon: Cephus Shelling, MD;  Location: [facility];  Service: Cardiovascular;  Laterality: N/A;   ABDOMINAL AORTOGRAM W/LOWER EXTREMITY N/A 08/06/2022   Procedure: ABDOMINAL AORTOGRAM W/LOWER EXTREMITY;  Surgeon: Cephus Shelling, MD;  Location: [facility];  Service: Cardiovascular;  Laterality: N/A;   AMPUTATION Bilateral 07/20/2022   Procedure: AMPUTATION RIGHT GREAT TOE AND SECOND TOE, AMPUTATION LEFT GREAT TOE;  Surgeon: Nadara Mustard, MD;  Location: [facility] OR;  Service: Orthopedics;  Laterality: Bilateral;   AMPUTATION Bilateral 08/08/2022   Procedure: BILATERAL TRANSMETATARSAL AMPUTATION;  Surgeon: Nadara Mustard, MD;  Location: [facility] OR;  Service: Orthopedics;  Laterality: Bilateral;   AMPUTATION TOE     PERIPHERAL VASCULAR ATHERECTOMY Right 01/14/2020   Procedure: PERIPHERAL VASCULAR ATHERECTOMY;  Surgeon: Cephus Shelling, MD;  Location: [facility] INVASIVE CV LAB;  Service: Cardiovascular;   Laterality: Right;  sfa   PERIPHERAL VASCULAR INTERVENTION Right 01/14/2020   Procedure: PERIPHERAL VASCULAR INTERVENTION;  Surgeon: Cephus Shelling, MD;  Location: [facility];  Service: Cardiovascular;  Laterality: Right;  sfa stent    PERIPHERAL VASCULAR INTERVENTION  07/05/2022   Procedure: PERIPHERAL VASCULAR INTERVENTION;  Surgeon: Cephus Shelling, MD;  [facility]: [facility];  Service: Cardiovascular;;   PERIPHERAL VASCULAR INTERVENTION  08/02/2022   Procedure: PERIPHERAL VASCULAR INTERVENTION;  Surgeon: Cephus Shelling, MD;  [facility]: [facility];  Service: Cardiovascular;;   PERIPHERAL VASCULAR INTERVENTION  08/06/2022   Procedure: PERIPHERAL VASCULAR INTERVENTION;  Surgeon: Cephus Shelling, MD;  Location: [facility] INVASIVE CV LAB;  Service: Cardiovascular;;   PERIPHERAL VASCULAR THROMBECTOMY  08/02/2022   Procedure: PERIPHERAL VASCULAR THROMBECTOMY;  Surgeon: Cephus Shelling, MD;  Location: [facility] INVASIVE CV LAB;  Service: Cardiovascular;;   RIGHT/LEFT HEART CATH AND CORONARY ANGIOGRAPHY N/A 08/23/2022   Procedure: RIGHT/LEFT HEART CATH AND CORONARY ANGIOGRAPHY;  Surgeon: [name], MD;  Location: [facility] INVASIVE CV LAB;  Service: Cardiovascular;  Laterality: N/A;   SKIN SPLIT GRAFT Right 03/18/2020   Procedure: SKIN GRAFTING RIGHT FOOT ULCER;  Surgeon: Nadara Mustard, MD;  Location: [facility] OR;  Service: Orthopedics;  Laterality: Right;   TEE WITHOUT CARDIOVERSION N/A 08/27/2022   Procedure: TRANSESOPHAGEAL ECHOCARDIOGRAM;  Surgeon: [name], MD;  Location: [facility] INVASIVE CV LAB;  Service: Cardiovascular;  Laterality: N/A;   WISDOM TOOTH EXTRACTION     Social History   Occupational History   Not on file  Tobacco Use   Smoking status: Former    Types: Cigarettes    Quit date: 01/2017    Years since quitting: 5.6   Smokeless tobacco: Never  Vaping Use   Vaping Use: Never used  Substance and Sexual Activity   Alcohol use: Not Currently    Drug use: Yes    Types: Marijuana   Sexual activity: Yes    Birth control/protection: Post-menopausal

## 2022-09-17 ENCOUNTER — Ambulatory Visit (INDEPENDENT_AMBULATORY_CARE_PROVIDER_SITE_OTHER): Payer: Managed Care, Other (non HMO) | Admitting: Thoracic Surgery (Cardiothoracic Vascular Surgery)

## 2022-09-17 ENCOUNTER — Encounter: Payer: Self-pay | Admitting: Thoracic Surgery (Cardiothoracic Vascular Surgery)

## 2022-09-17 ENCOUNTER — Other Ambulatory Visit: Payer: Self-pay | Admitting: Thoracic Surgery (Cardiothoracic Vascular Surgery)

## 2022-09-17 VITALS — BP 154/79 | HR 77 | Resp 18 | Ht 64.0 in | Wt 204.0 lb

## 2022-09-17 DIAGNOSIS — I340 Nonrheumatic mitral (valve) insufficiency: Secondary | ICD-10-CM

## 2022-09-17 DIAGNOSIS — I2510 Atherosclerotic heart disease of native coronary artery without angina pectoris: Secondary | ICD-10-CM

## 2022-09-17 NOTE — Progress Notes (Signed)
[address]       [place] [address]             [phone]           [facility] Medical Record #[id number] Date of Birth: 05/31/1965  Kayla Crafts, MD Medicine, [facility] Internal  Chief Complaint: Follow up from admission for NSTEMI    History of Present Illness:     Pt known to me from consult in hospital for NSTEMI following bilateral transmetatarsal amputations. Pt was found at that admission to have CAD (OM not graftable) with moderate AS and MR. Pt with slightly depressed LV function. Plan was to rehab from amputation surgery and to be ambulatory prior to CABG/AVR +- MVR. Pt has seen Ortho and on being allowed to ambulate, had her Right foot incision reopen. She is now back to limited ambulation. Pt also without CP, SOB or lightheadedness. She feels well. Wants to get totally over foot issues and hoping to have surgery after summer.      Past Medical History:  Diagnosis Date   Arthritis    Critical limb ischemia of both lower extremities (HCC)    Diabetes mellitus without complication (HCC)    type 2   GERD (gastroesophageal reflux disease)    Hypercholesteremia    Hypertension    Hypothyroidism    PAD (peripheral artery disease) (HCC)    PONV (postoperative nausea and vomiting)    Thyroid disease     Past Surgical History:  Procedure Laterality Date   ABDOMINAL AORTOGRAM W/LOWER EXTREMITY Bilateral 01/14/2020   Procedure: ABDOMINAL AORTOGRAM W/LOWER EXTREMITY;  Surgeon: Cephus Shelling, MD;  [facility];  Service: Cardiovascular;  Laterality: Bilateral;   ABDOMINAL AORTOGRAM W/LOWER EXTREMITY N/A 07/05/2022   Procedure: ABDOMINAL AORTOGRAM W/LOWER EXTREMITY;  Surgeon: Cephus Shelling, MD;  [facility]: [facility];  Service: Cardiovascular;  Laterality: N/A;   ABDOMINAL AORTOGRAM W/LOWER EXTREMITY N/A 08/02/2022   Procedure: ABDOMINAL AORTOGRAM W/LOWER EXTREMITY;  Surgeon: Cephus Shelling, MD;  [facility];  Service: Cardiovascular;  Laterality: N/A;   ABDOMINAL AORTOGRAM W/LOWER EXTREMITY N/A 08/06/2022   Procedure: ABDOMINAL AORTOGRAM W/LOWER EXTREMITY;  Surgeon: Cephus Shelling, MD;  [facility]: [facility];  Service: Cardiovascular;  Laterality: N/A;   AMPUTATION Bilateral 07/20/2022   Procedure: AMPUTATION RIGHT GREAT TOE AND SECOND TOE, AMPUTATION LEFT GREAT TOE;  Surgeon: Nadara Mustard, MD;  Location: [facility] OR;  Service: Orthopedics;  Laterality: Bilateral;   AMPUTATION Bilateral 08/08/2022   Procedure: BILATERAL TRANSMETATARSAL AMPUTATION;  Surgeon: Nadara Mustard, MD;  Location: [facility] OR;  Service: Orthopedics;  Laterality: Bilateral;   AMPUTATION TOE     PERIPHERAL VASCULAR ATHERECTOMY Right 01/14/2020   Procedure: PERIPHERAL VASCULAR ATHERECTOMY;  Surgeon: Cephus Shelling, MD;  Location: [facility] INVASIVE CV LAB;  Service: Cardiovascular;  Laterality: Right;  sfa   PERIPHERAL VASCULAR INTERVENTION Right 01/14/2020   Procedure: PERIPHERAL VASCULAR INTERVENTION;  Surgeon: Cephus Shelling, MD;  [facility]: [facility];  Service: Cardiovascular;  Laterality: Right;  sfa stent    PERIPHERAL VASCULAR INTERVENTION  07/05/2022   Procedure: PERIPHERAL VASCULAR INTERVENTION;  Surgeon: Cephus Shelling, MD;  [facility]: [facility];  Service: Cardiovascular;;   PERIPHERAL VASCULAR INTERVENTION  08/02/2022   Procedure: PERIPHERAL VASCULAR INTERVENTION;  Surgeon: Cephus Shelling, MD;  [facility]: [facility];  Service: Cardiovascular;;   PERIPHERAL VASCULAR INTERVENTION  08/06/2022  Procedure: PERIPHERAL VASCULAR INTERVENTION;  Surgeon: Cephus Shelling, MD;  Location: [facility] INVASIVE CV LAB;  Service: Cardiovascular;;   PERIPHERAL VASCULAR THROMBECTOMY  08/02/2022   Procedure: PERIPHERAL VASCULAR THROMBECTOMY;  Surgeon: Cephus Shelling, MD;  [facility];  Service: Cardiovascular;;   RIGHT/LEFT HEART CATH AND CORONARY  ANGIOGRAPHY N/A 08/23/2022   Procedure: RIGHT/LEFT HEART CATH AND CORONARY ANGIOGRAPHY;  Surgeon: Kayla Crafts, MD;  Location: [facility] INVASIVE CV LAB;  Service: Cardiovascular;  Laterality: N/A;   SKIN SPLIT GRAFT Right 03/18/2020   Procedure: SKIN GRAFTING RIGHT FOOT ULCER;  Surgeon: Nadara Mustard, MD;  Location: [facility] OR;  Service: Orthopedics;  Laterality: Right;   TEE WITHOUT CARDIOVERSION N/A 08/27/2022   Procedure: TRANSESOPHAGEAL ECHOCARDIOGRAM;  Surgeon: [name], MD;  Location: [facility] INVASIVE CV LAB;  Service: Cardiovascular;  Laterality: N/A;   WISDOM TOOTH EXTRACTION      Social History   Tobacco Use  Smoking Status Former   Types: Cigarettes   Quit date: 01/2017   Years since quitting: 5.6  Smokeless Tobacco Never    Social History   Substance and Sexual Activity  Alcohol Use Not Currently    Social History   Socioeconomic History   Marital status: Divorced    Spouse name: Not on file   Number of children: 0   Years of education: Not on file   Highest education level: Not on file  Occupational History   Not on file  Tobacco Use   Smoking status: Former    Types: Cigarettes    Quit date: 01/2017    Years since quitting: 5.6   Smokeless tobacco: Never  Vaping Use   Vaping Use: Never used  Substance and Sexual Activity   Alcohol use: Not Currently   Drug use: Yes    Types: Marijuana   Sexual activity: Yes    Birth control/protection: Post-menopausal  Other Topics Concern   Not on file  Social History Narrative   Not on file   Social Determinants of Health   Financial Resource Strain: Not on file  Food Insecurity: No Food Insecurity (08/20/2022)   Hunger Vital Sign    Worried About Running Out of Food in the Last Year: Never true    Ran Out of Food in the Last Year: Never true  Transportation Needs: No Transportation Needs (08/20/2022)   PRAPARE - Administrator, Civil Service (Medical): No    Lack of Transportation  (Non-Medical): No  Physical Activity: Not on file  Stress: Not on file  Social Connections: Not on file  Intimate Partner Violence: Not At Risk (08/20/2022)   Humiliation, Afraid, Rape, and Kick questionnaire    Fear of Current or Ex-Partner: No    Emotionally Abused: No    Physically Abused: No    Sexually Abused: No    Allergies  Allergen Reactions   Doxycycline Nausea And Vomiting    Extremely N/V   Trental [Pentoxifylline] Nausea And Vomiting   Penicillins Rash    Tolerated cephalosporins 07/2022     Current Outpatient Medications  Medication Sig Dispense Refill   acetaminophen (TYLENOL) 500 MG tablet Take 500-1,000 mg by mouth every 6 (six) hours as needed for moderate pain.     amLODipine (NORVASC) 10 MG tablet Take 10 mg by mouth daily.     aspirin 81 MG EC tablet Take 1 tablet by mouth daily.     atorvastatin (LIPITOR) 40 MG tablet Take 40 mg by mouth daily.  clopidogrel (PLAVIX) 75 MG tablet TAKE 1 TABLET BY MOUTH EVERY DAY WITH BREAKFAST (Patient taking differently: Take 75 mg by mouth daily.) 90 tablet 3   diphenhydrAMINE HCl, Sleep, (ZZZQUIL) 25 MG CAPS Take 25 mg by mouth at bedtime as needed (Sleep).     empagliflozin (JARDIANCE) 10 MG TABS tablet Take 1 tablet (10 mg total) by mouth daily. 30 tablet 3   famotidine (PEPCID) 20 MG tablet Take 20 mg by mouth daily.     furosemide (LASIX) 40 MG tablet Take 1 tablet (40 mg total) by mouth 2 (two) times daily. 30 tablet 3   gabapentin (NEURONTIN) 300 MG capsule Take 300 mg by mouth at bedtime.     insulin degludec (TRESIBA FLEXTOUCH) 100 UNIT/ML FlexTouch Pen Inject 10 Units into the skin at bedtime. (Patient taking differently: Inject 12-18 Units into the skin at bedtime.) 3 mL 3   levothyroxine (SYNTHROID) 150 MCG tablet Take 150 mcg by mouth daily.     metoprolol succinate (TOPROL-XL) 25 MG 24 hr tablet Take 0.5 tablets (12.5 mg total) by mouth daily. 30 tablet 1   pantoprazole (PROTONIX) 40 MG tablet Take 40 mg by  mouth daily.     polyethylene glycol (MIRALAX / GLYCOLAX) 17 g packet Take 17 g by mouth daily.     Semaglutide, 1 MG/DOSE, (OZEMPIC, 1 MG/DOSE,) 2 MG/1.5ML SOPN Inject 1 mg into the skin every Saturday.     No current facility-administered medications for this visit.     Family History  Problem Relation Age of Onset   Diabetes Other    CAD Other        Physical Exam: Lungs: clear Card: rr with 3/6 high pitched sem Ext: Left foot in shoe and Right foot still in brace Neuro: alert Pt looks very well     Diagnostic Studies & Laboratory data: I have personally reviewed the following studies and agree with the findings   TEE (07/2022) IMPRESSIONS     1. Left ventricular ejection fraction, by estimation, is 40 to 45%. The  left ventricle has mildly decreased function.   2. Right ventricular systolic function is normal. The right ventricular  size is normal.   3. The mitral valve is degenerative. There is tethering of the posterior  mitral valve leaflet with moderate mitral valve regurgitation (SBP 110s  during study). Pulmonary vein flow is normal.   4. The aortic valve is tricuspid. There is moderate calcification of the  aortic valve. There is moderate thickening of the aortic valve. Moderate  aortic valve stenosis. Aortic valve area, by VTI measures 1.08 cm. AVA by  planimetry is 1.2cm2. Aortic  valve mean gradient measures 24.0 mmHg. Aortic valve Vmax measures 3.13  m/s. DI 0.32.   5. No left atrial/left atrial appendage thrombus was detected.   6. A small pericardial effusion is present. The pericardial effusion is  circumferential. There is no evidence of cardiac tamponade.   7. Agitated saline contrast bubble study was negative, with no evidence  of any interatrial shunt.   8. There is Moderate (Grade III) plaque.   FINDINGS   Left Ventricle: Left ventricular ejection fraction, by estimation, is 40  to 45%. The left ventricle has mildly decreased function. The  left  ventricular internal cavity size was normal in size.   Right Ventricle: The right ventricular size is normal. No increase in  right ventricular wall thickness. Right ventricular systolic function is  normal.   Left Atrium: Left atrial size was normal in  size. No left atrial/left  atrial appendage thrombus was detected.   Right Atrium: Right atrial size was normal in size. Prominent Eustachian  valve.   Pericardium: A small pericardial effusion is present. The pericardial  effusion is circumferential. There is no evidence of cardiac tamponade.   Mitral Valve: The mitral valve is degenerative in appearance. There is  mild thickening of the mitral valve leaflet(s). There is mild  calcification of the mitral valve leaflet(s). There is tethering of the  posterior mitral valve leaflet with moderate  mitral valve regurgitation. MV peak gradient, 9.0 mmHg. The mean mitral  valve gradient is 5.0 mmHg.   Tricuspid Valve: The tricuspid valve is normal in structure. Tricuspid  valve regurgitation is trivial.   Aortic Valve: DI 0.32. The aortic valve is tricuspid. There is moderate  calcification of the aortic valve. There is moderate thickening of the  aortic valve. Aortic valve regurgitation is trivial. Moderate aortic  stenosis is present. Aortic valve mean  gradient measures 24.0 mmHg. Aortic valve peak gradient measures 39.2  mmHg. Aortic valve area, by VTI measures 1.08 cm.   Pulmonic Valve: The pulmonic valve was normal in structure. Pulmonic valve  regurgitation is trivial.   Aorta: The aortic root is normal in size and structure. There is moderate  (Grade III) plaque.   Venous: The left upper pulmonary vein, right upper pulmonary vein and  right lower pulmonary vein are normal.   IAS/Shunts: The atrial septum is grossly normal. Agitated saline contrast  was given intravenously to evaluate for intracardiac shunting. Agitated  saline contrast bubble study was negative,  with no evidence of any  interatrial shunt.     LEFT VENTRICLE  PLAX 2D  LVOT diam:     2.10 cm  LV SV:         75  LV SV Index:   38  LVOT Area:     3.46 cm     AORTIC VALVE  AV Area (Vmax):    1.11 cm  AV Area (Vmean):   0.99 cm  AV Area (VTI):     1.08 cm  AV Vmax:           313.00 cm/s  AV Vmean:          231.000 cm/s  AV VTI:            0.694 m  AV Peak Grad:      39.2 mmHg  AV Mean Grad:      24.0 mmHg  LVOT Vmax:         100.00 cm/s  LVOT Vmean:        66.100 cm/s  LVOT VTI:          0.217 m  LVOT/AV VTI ratio: 0.31   MITRAL VALVE  MV Area VTI:  1.99 cm        SHUNTS  MV Peak grad: 9.0 mmHg        Systemic VTI:  0.22 m  MV Mean grad: 5.0 mmHg        Systemic Diam: 2.10 cm  MV Vmax:      1.50 m/s  MV Vmean:     107.0 cm/s  MR Peak grad:    137.4 mmHg  MR Mean grad:    85.0 mmHg  MR Vmax:         586.00 cm/s  MR Vmean:        423.0 cm/s  MR PISA:  1.27 cm  MR PISA Eff ROA: 9 mm  MR PISA Radius:  0.45 cm    Recent Radiology Findings:       Recent Lab Findings: Lab Results  Component Value Date   WBC 4.9 08/25/2022   HGB 7.8 (L) 08/25/2022   HCT 25.9 (L) 08/25/2022   PLT 327 08/25/2022   GLUCOSE 132 (H) 08/25/2022   CHOL 92 08/03/2022   TRIG 127 08/03/2022   HDL 13 (L) 08/03/2022   LDLCALC 54 08/03/2022   ALT 14 08/06/2022   AST 17 08/06/2022   NA 136 08/25/2022   K 3.9 08/25/2022   CL 96 (L) 08/25/2022   CREATININE 1.28 (H) 08/25/2022   BUN 22 (H) 08/25/2022   CO2 28 08/25/2022   INR 1.2 08/18/2022   HGBA1C 6.4 (H) 08/02/2022      Assessment / Plan:     SP NSTEMI with severe PVD with bilateral transmetatarsal amputations still with wound issues. With CAD (OM not graftable) with need for RCA/LAD revascularization and with moderate AS would need in addition AVR. Need to determine need for MVR with repeat TTE when available. Pt appears asymptomatic and with moderate CAD of graftable vessels and only moderate AS, I feel following  TTE if no worse, would allow her to postpone surgery and perhaps continue medical therapy ongoing. Needs to have follow up with Cardiology and will arrange. Will see back in 6 weeks   I have spent 30 min in review of the records, viewing studies and in face to face with patient and in coordination of future care    Eugenio Hoes 09/17/2022 8:12 AM

## 2022-09-17 NOTE — Patient Instructions (Signed)
TTE Follow up with Cardiology Follow up with me in 6 weeks

## 2022-09-25 ENCOUNTER — Encounter: Payer: Self-pay | Admitting: Orthopedic Surgery

## 2022-09-25 ENCOUNTER — Ambulatory Visit (INDEPENDENT_AMBULATORY_CARE_PROVIDER_SITE_OTHER): Payer: Managed Care, Other (non HMO) | Admitting: Orthopedic Surgery

## 2022-09-25 DIAGNOSIS — Z89439 Acquired absence of unspecified foot: Secondary | ICD-10-CM

## 2022-09-25 NOTE — Progress Notes (Signed)
Office Visit Note   Patient: Kayla Schmitt           Date of Birth: Aug 05, 1965           MRN: [id number] Visit Date: 09/25/2022              Requested by: Medicine, [facility] [address] [place],  [place] [address] PCP: Medicine, [facility]  Chief Complaint  Patient presents with   Right Foot - Routine Post Op    08/08/2022 bilateral transmet amputations   Left Foot - Routine Post Op      HPI: Patient is 6 weeks status post bilateral transmetatarsal amputations.  Assessment & Plan: Visit Diagnoses:  1. History of transmetatarsal amputation of foot (HCC)     Plan: Patient will continue with protected weightbearing she is given a note that she may return to her seated work with activities as tolerated.  Follow-Up Instructions: Return in about 4 weeks (around 10/23/2022).   Ortho Exam  Patient is alert, oriented, no adenopathy, well-dressed, normal affect, normal respiratory effort. Examination the left transmetatarsal amputation is completely healed she does have swelling in both lower extremities right foot has a small eschar and a small wound laterally.  Imaging: No results found.    Labs: Lab Results  Component Value Date   HGBA1C 6.4 (H) 08/02/2022   HGBA1C 11.0 (H) 01/11/2020   ESRSEDRATE 136 (H) 01/14/2020   ESRSEDRATE 126 (H) 01/13/2020   ESRSEDRATE 125 (H) 01/12/2020   CRP 11.3 (H) 01/14/2020   CRP 13.0 (H) 01/13/2020   CRP 15.8 (H) 01/12/2020   LABURIC 7.7 (H) 02/04/2022   REPTSTATUS 08/07/2022 FINAL 08/02/2022   CULT  08/02/2022    NO GROWTH 5 DAYS Performed at [facility], [address] [place], [place] [address]      Lab Results  Component Value Date   ALBUMIN 1.6 (L) 08/06/2022   ALBUMIN 1.7 (L) 08/05/2022   ALBUMIN 1.9 (L) 08/04/2022    Lab Results  Component Value Date   MG 1.9 08/25/2022   MG 1.7 08/24/2022   MG 1.7 08/23/2022   No results found for: "VD25OH"  No results found for: "PREALBUMIN"    Latest Ref  Rng & Units 08/25/2022    2:35 AM 08/24/2022    3:09 AM 08/23/2022    9:26 AM  CBC EXTENDED  WBC 4.0 - 10.5 K/uL 4.9  4.6    RBC 3.87 - 5.11 MIL/uL 2.83  2.74    Hemoglobin 12.0 - 15.0 g/dL 7.8  7.7  7.8   HCT 86.5 - 46.0 % 25.9  24.4  23.0   Platelets 150 - 400 K/uL 327  298       There is no height or weight on file to calculate BMI.  Orders:  No orders of the defined types were placed in this encounter.  No orders of the defined types were placed in this encounter.    Procedures: No procedures performed  Clinical Data: No additional findings.  ROS:  All other systems negative, except as noted in the HPI. Review of Systems  Objective: Vital Signs: There were no vitals taken for this visit.  Specialty Comments:  No specialty comments available.  PMFS History: Patient Active Problem List   Diagnosis Date Noted   Severe mitral regurgitation by prior echocardiogram 08/27/2022   NSTEMI (non-ST elevated myocardial infarction) (HCC) 08/18/2022   Acute clinical systolic heart failure (HCC) 08/18/2022   Acute blood loss anemia 08/18/2022  Anemia 08/18/2022   Critical limb ischemia of both lower extremities (HCC) 06/19/2022   DM type 2 (diabetes mellitus, type 2) (HCC) 01/28/2020   Hyperlipidemia 01/28/2020   Hypertension 01/28/2020   Hypothyroidism 01/28/2020   Obesity 01/28/2020   Ulcerated, foot, right, limited to breakdown of skin (HCC)    Cellulitis of right foot    Peripheral arterial disease (HCC)    Cellulitis 01/11/2020   Blister of leg 01/06/2020   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/28/2017   Chest pain 10/28/2017   GERD (gastroesophageal reflux disease) 10/28/2017   Past Medical History:  Diagnosis Date   Arthritis    Critical limb ischemia of both lower extremities (HCC)    Diabetes mellitus without complication (HCC)    type 2   GERD (gastroesophageal reflux disease)    Hypercholesteremia    Hypertension    Hypothyroidism    PAD  (peripheral artery disease) (HCC)    PONV (postoperative nausea and vomiting)    Thyroid disease     Family History  Problem Relation Age of Onset   Diabetes Other    CAD Other     Past Surgical History:  Procedure Laterality Date   ABDOMINAL AORTOGRAM W/LOWER EXTREMITY Bilateral 01/14/2020   Procedure: ABDOMINAL AORTOGRAM W/LOWER EXTREMITY;  Surgeon: Cephus Shelling, MD;  [facility]: [facility];  Service: Cardiovascular;  Laterality: Bilateral;   ABDOMINAL AORTOGRAM W/LOWER EXTREMITY N/A 07/05/2022   Procedure: ABDOMINAL AORTOGRAM W/LOWER EXTREMITY;  Surgeon: Cephus Shelling, MD;  Location: [facility];  Service: Cardiovascular;  Laterality: N/A;   ABDOMINAL AORTOGRAM W/LOWER EXTREMITY N/A 08/02/2022   Procedure: ABDOMINAL AORTOGRAM W/LOWER EXTREMITY;  Surgeon: Cephus Shelling, MD;  Location: [facility];  Service: Cardiovascular;  Laterality: N/A;   ABDOMINAL AORTOGRAM W/LOWER EXTREMITY N/A 08/06/2022   Procedure: ABDOMINAL AORTOGRAM W/LOWER EXTREMITY;  Surgeon: Cephus Shelling, MD;  Location: [facility];  Service: Cardiovascular;  Laterality: N/A;   AMPUTATION Bilateral 07/20/2022   Procedure: AMPUTATION RIGHT GREAT TOE AND SECOND TOE, AMPUTATION LEFT GREAT TOE;  Surgeon: Nadara Mustard, MD;  Location: [facility];  Service: Orthopedics;  Laterality: Bilateral;   AMPUTATION Bilateral 08/08/2022   Procedure: BILATERAL TRANSMETATARSAL AMPUTATION;  Surgeon: Nadara Mustard, MD;  Location: [facility] OR;  Service: Orthopedics;  Laterality: Bilateral;   AMPUTATION TOE     PERIPHERAL VASCULAR ATHERECTOMY Right 01/14/2020   Procedure: PERIPHERAL VASCULAR ATHERECTOMY;  Surgeon: Cephus Shelling, MD;  Location: [facility] INVASIVE CV LAB;  Service: Cardiovascular;  Laterality: Right;  sfa   PERIPHERAL VASCULAR INTERVENTION Right 01/14/2020   Procedure: PERIPHERAL VASCULAR INTERVENTION;  Surgeon: Cephus Shelling, MD;  [facility]: [facility];  Service: Cardiovascular;   Laterality: Right;  sfa stent    PERIPHERAL VASCULAR INTERVENTION  07/05/2022   Procedure: PERIPHERAL VASCULAR INTERVENTION;  Surgeon: Cephus Shelling, MD;  [facility];  Service: Cardiovascular;;   PERIPHERAL VASCULAR INTERVENTION  08/02/2022   Procedure: PERIPHERAL VASCULAR INTERVENTION;  Surgeon: Cephus Shelling, MD;  [facility];  Service: Cardiovascular;;   PERIPHERAL VASCULAR INTERVENTION  08/06/2022   Procedure: PERIPHERAL VASCULAR INTERVENTION;  Surgeon: Cephus Shelling, MD;  Location: [facility] INVASIVE CV LAB;  Service: Cardiovascular;;   PERIPHERAL VASCULAR THROMBECTOMY  08/02/2022   Procedure: PERIPHERAL VASCULAR THROMBECTOMY;  Surgeon: Cephus Shelling, MD;  [facility];  Service: Cardiovascular;;   RIGHT/LEFT HEART CATH AND CORONARY ANGIOGRAPHY N/A 08/23/2022   Procedure: RIGHT/LEFT HEART  CATH AND CORONARY ANGIOGRAPHY;  Surgeon: [name], MD;  Location: [facility] INV[facility]IVE CV LAB;  Service: Cardiovascular;  Laterality: N/A;   SKIN SPLIT GRAFT Right 03/18/2020   Procedure: SKIN GRAFTING RIGHT FOOT ULCER;  Surgeon: Nadara Mustard, MD;  Location: [facility] OR;  Service: Orthopedics;  Laterality: Right;   TEE WITHOUT CARDIOVERSION N/A 08/27/2022   Procedure: TRANSESOPHAGEAL ECHOCARDIOGRAM;  Surgeon: [name], MD;  Location: [facility] INVASIVE CV LAB;  Service: Cardiovascular;  Laterality: N/A;   WISDOM TOOTH EXTRACTION     Social History   Occupational History   Not on file  Tobacco Use   Smoking status: Former    Types: Cigarettes    Quit date: 01/2017    Years since quitting: 5.6   Smokeless tobacco: Never  Vaping Use   Vaping Use: Never used  Substance and Sexual Activity   Alcohol use: Not Currently   Drug use: Yes    Types: Marijuana   Sexual activity: Yes    Birth control/protection: Post-menopausal

## 2022-10-02 ENCOUNTER — Other Ambulatory Visit: Payer: Self-pay | Admitting: *Deleted

## 2022-10-02 DIAGNOSIS — I739 Peripheral vascular disease, unspecified: Secondary | ICD-10-CM

## 2022-10-02 DIAGNOSIS — I70223 Atherosclerosis of native arteries of extremities with rest pain, bilateral legs: Secondary | ICD-10-CM

## 2022-10-04 ENCOUNTER — Ambulatory Visit: Payer: Managed Care, Other (non HMO) | Admitting: Orthopedic Surgery

## 2022-10-09 ENCOUNTER — Encounter: Payer: Self-pay | Admitting: Vascular Surgery

## 2022-10-09 ENCOUNTER — Ambulatory Visit (INDEPENDENT_AMBULATORY_CARE_PROVIDER_SITE_OTHER)
Admission: RE | Admit: 2022-10-09 | Discharge: 2022-10-09 | Discharge status: Home / Self Care | Payer: Managed Care, Other (non HMO) | Source: Ambulatory Visit | Attending: Vascular Surgery

## 2022-10-09 ENCOUNTER — Ambulatory Visit (HOSPITAL_COMMUNITY)
Admission: RE | Admit: 2022-10-09 | Discharge: 2022-10-09 | Disposition: A | Discharge status: Home / Self Care | Payer: Managed Care, Other (non HMO) | Source: Ambulatory Visit | Attending: Vascular Surgery | Admitting: Vascular Surgery

## 2022-10-09 ENCOUNTER — Ambulatory Visit (INDEPENDENT_AMBULATORY_CARE_PROVIDER_SITE_OTHER): Payer: Managed Care, Other (non HMO) | Admitting: Vascular Surgery

## 2022-10-09 VITALS — BP 137/78 | HR 73 | Temp 97.9°F | Resp 16 | Ht 64.0 in | Wt 202.0 lb

## 2022-10-09 DIAGNOSIS — I739 Peripheral vascular disease, unspecified: Secondary | ICD-10-CM | POA: Insufficient documentation

## 2022-10-09 DIAGNOSIS — I70223 Atherosclerosis of native arteries of extremities with rest pain, bilateral legs: Secondary | ICD-10-CM

## 2022-10-09 LAB — VAS US ABI WITH/WO TBI
Left ABI: 1.02
Right ABI: 1.02

## 2022-10-09 NOTE — Progress Notes (Signed)
Patient name: Kayla Schmitt MRN: [id number] DOB: [birth year] Sex: female  REASON FOR VISIT: Hospital follow-up  HPI: Kayla Schmitt is a 57 y.o. female with history of hypertension, hyperlipidemia, diabetes that presents for hospital follow-up.  Most recently on 08/02/2022 she underwent left SFA and above-knee popliteal percutaneous thrombectomy with JETI and additional angioplasty and stenting into the above-knee popliteal artery for an occluded distal SFA stent with tissue loss.  She then on 08/06/2022 underwent laser arthrectomy of the right SFA above-knee popliteal with DCB for in-stent stenosis.  She had bilateral TMA's with Dr. Lajoyce Corners on 08/08/2022.  The left TMA is healed.  The right TMA is still healing and is requiring wound care.  She remains on aspirin, plavix, statin.  She previously underwent right leg intervention on 01/14/2020 with right SFA atherectomy and angioplasty and stent placement as well as right above-knee popliteal angioplasty.   She later underwent skin grafting with Dr. Lajoyce Corners. Her right foot wound previously healed.  Past Medical History:  Diagnosis Date   Arthritis    Critical limb ischemia of both lower extremities (HCC)    Diabetes mellitus without complication (HCC)    type 2   GERD (gastroesophageal reflux disease)    Hypercholesteremia    Hypertension    Hypothyroidism    PAD (peripheral artery disease) (HCC)    PONV (postoperative nausea and vomiting)    Thyroid disease     Past Surgical History:  Procedure Laterality Date   ABDOMINAL AORTOGRAM W/LOWER EXTREMITY Bilateral 01/14/2020   Procedure: ABDOMINAL AORTOGRAM W/LOWER EXTREMITY;  Surgeon: Cephus Shelling, MD;  [facility];  Service: Cardiovascular;  Laterality: Bilateral;   ABDOMINAL AORTOGRAM W/LOWER EXTREMITY N/A 07/05/2022   Procedure: ABDOMINAL AORTOGRAM W/LOWER EXTREMITY;  Surgeon: Cephus Shelling, MD;  [facility]: [facility];  Service: Cardiovascular;   Laterality: N/A;   ABDOMINAL AORTOGRAM W/LOWER EXTREMITY N/A 08/02/2022   Procedure: ABDOMINAL AORTOGRAM W/LOWER EXTREMITY;  Surgeon: Cephus Shelling, MD;  [facility]: [facility];  Service: Cardiovascular;  Laterality: N/A;   ABDOMINAL AORTOGRAM W/LOWER EXTREMITY N/A 08/06/2022   Procedure: ABDOMINAL AORTOGRAM W/LOWER EXTREMITY;  Surgeon: Cephus Shelling, MD;  [facility]: [facility];  Service: Cardiovascular;  Laterality: N/A;   AMPUTATION Bilateral 07/20/2022   Procedure: AMPUTATION RIGHT GREAT TOE AND SECOND TOE, AMPUTATION LEFT GREAT TOE;  Surgeon: Nadara Mustard, MD;  Location[facility];  Service: Orthopedics;  Laterality: Bilateral;   AMPUTATION Bilateral 08/08/2022   Procedure: BILATERAL TRANSMETATARSAL AMPUTATION;  Surgeon: Nadara Mustard, MD;  Location: [facility] OR;  Service: Orthopedics;  Laterality: Bilateral;   AMPUTATION TOE     PERIPHERAL VASCULAR ATHERECTOMY Right 01/14/2020   Procedure: PERIPHERAL VASCULAR ATHERECTOMY;  Surgeon: Cephus Shelling, MD;  Location: [facility] INVASIVE CV LAB;  Service: Cardiovascular;  Laterality: Right;  sfa   PERIPHERAL VASCULAR INTERVENTION Right 01/14/2020   Procedure: PERIPHERAL VASCULAR INTERVENTION;  Surgeon: Cephus Shelling, MD;  [facility];  Service: Cardiovascular;  Laterality: Right;  sfa stent    PERIPHERAL VASCULAR INTERVENTION  07/05/2022   Procedure: PERIPHERAL VASCULAR INTERVENTION;  Surgeon: Cephus Shelling, MD;  [facility]: [facility];  Service: Cardiovascular;;   PERIPHERAL VASCULAR INTERVENTION  08/02/2022   Procedure: PERIPHERAL VASCULAR INTERVENTION;  Surgeon: Cephus Shelling, MD;  [facility];  Service: Cardiovascular;;   PERIPHERAL VASCULAR INTERVENTION  08/06/2022   Procedure: PERIPHERAL VASCULAR INTERVENTION;  Surgeon: Cephus Shelling, MD;  Location: [facility]  INVASIVE CV LAB;  Service: Cardiovascular;;   PERIPHERAL VASCULAR THROMBECTOMY  08/02/2022   Procedure: PERIPHERAL  VASCULAR THROMBECTOMY;  Surgeon: Cephus Shelling, MD;  [facility];  Service: Cardiovascular;;   RIGHT/LEFT HEART CATH AND CORONARY ANGIOGRAPHY N/A 08/23/2022   Procedure: RIGHT/LEFT HEART CATH AND CORONARY ANGIOGRAPHY;  Surgeon: [name], MD;  Location: [facility] INVASIVE CV LAB;  Service: Cardiovascular;  Laterality: N/A;   SKIN SPLIT GRAFT Right 03/18/2020   Procedure: SKIN GRAFTING RIGHT FOOT ULCER;  Surgeon: Nadara Mustard, MD;  Location: [facility] OR;  Service: Orthopedics;  Laterality: Right;   TEE WITHOUT CARDIOVERSION N/A 08/27/2022   Procedure: TRANSESOPHAGEAL ECHOCARDIOGRAM;  Surgeon: [name], MD;  Location: [facility] INVASIVE CV LAB;  Service: Cardiovascular;  Laterality: N/A;   WISDOM TOOTH EXTRACTION      Family History  Problem Relation Age of Onset   Diabetes Other    CAD Other     SOCIAL HISTORY: Social History   Tobacco Use   Smoking status: Former    Types: Cigarettes    Quit date: 01/2017    Years since quitting: 5.6   Smokeless tobacco: Never  Substance Use Topics   Alcohol use: Not Currently    Allergies  Allergen Reactions   Doxycycline Nausea And Vomiting    Extremely N/V   Trental [Pentoxifylline] Nausea And Vomiting   Penicillins Rash    Tolerated cephalosporins 07/2022     Current Outpatient Medications  Medication Sig Dispense Refill   acetaminophen (TYLENOL) 500 MG tablet Take 500-1,000 mg by mouth every 6 (six) hours as needed for moderate pain.     amLODipine (NORVASC) 10 MG tablet Take 10 mg by mouth daily.     aspirin 81 MG EC tablet Take 1 tablet by mouth daily.     atorvastatin (LIPITOR) 40 MG tablet Take 40 mg by mouth daily.     clopidogrel (PLAVIX) 75 MG tablet TAKE 1 TABLET BY MOUTH EVERY DAY WITH BREAKFAST (Patient taking differently: Take 75 mg by mouth daily.) 90 tablet 3   diphenhydrAMINE HCl, Sleep, (ZZZQUIL) 25 MG CAPS Take 25 mg by mouth at bedtime as needed (Sleep).     famotidine (PEPCID) 20 MG  tablet Take 20 mg by mouth daily.     furosemide (LASIX) 40 MG tablet Take 1 tablet (40 mg total) by mouth 2 (two) times daily. 30 tablet 3   gabapentin (NEURONTIN) 300 MG capsule Take 300 mg by mouth at bedtime.     insulin degludec (TRESIBA FLEXTOUCH) 100 UNIT/ML FlexTouch Pen Inject 10 Units into the skin at bedtime. (Patient taking differently: Inject 12-18 Units into the skin at bedtime.) 3 mL 3   levothyroxine (SYNTHROID) 150 MCG tablet Take 150 mcg by mouth daily.     metoprolol succinate (TOPROL-XL) 25 MG 24 hr tablet Take 0.5 tablets (12.5 mg total) by mouth daily. 30 tablet 1   pantoprazole (PROTONIX) 40 MG tablet Take 40 mg by mouth daily.     Semaglutide, 1 MG/DOSE, (OZEMPIC, 1 MG/DOSE,) 2 MG/1.5ML SOPN Inject 1 mg into the skin every Saturday.     Zinc 50 MG TABS Take by mouth.     No current facility-administered medications for this visit.    REVIEW OF SYSTEMS:  [X]  denotes positive finding, [ ]  denotes negative finding Cardiac  Comments:  Chest pain or chest pressure:    Shortness of breath upon exertion:    Short of breath when lying flat:    Irregular heart  rhythm:        Vascular    Pain in calf, thigh, or hip brought on by ambulation:    Pain in feet at night that wakes you up from your sleep:     Blood clot in your veins:    Leg swelling:         Pulmonary    Oxygen at home:    Productive cough:     Wheezing:         Neurologic    Sudden weakness in arms or legs:     Sudden numbness in arms or legs:     Sudden onset of difficulty speaking or slurred speech:    Temporary loss of vision in one eye:     Problems with dizziness:         Gastrointestinal    Blood in stool:     Vomited blood:         Genitourinary    Burning when urinating:     Blood in urine:        Psychiatric    Major depression:         Hematologic    Bleeding problems:    Problems with blood clotting too easily:        Skin    Rashes or ulcers:        Constitutional     Fever or chills:      PHYSICAL EXAM: Vitals:   10/09/22 1553  BP: 137/78  Pulse: 73  Resp: 16  Temp: 97.9 F (36.6 C)  TempSrc: Temporal  SpO2: 98%  Weight: 202 lb (91.6 kg)  Height: 5\' 4"  (1.626 m)    GENERAL: The patient is a well-nourished female, in no acute distress. The vital signs are documented above. CARDIAC: There is a regular rate and rhythm.  VASCULAR:  Bilateral femoral pulses palpable Left TMA healed  Right TMA as pictured with some dehiscense  Brisk PT signals bilaterally at the ankle             DATA:   ABIs today are 1.02 Right and 1.02 left   Lower extremity arterial duplex shows a 50 to 74% stenosis in the right external iliac with a velocity of 222.  The right SFA stents are patent.  Left SFA stents are patent without stenosis.  Assessment/Plan:  57 year old female presents for hospital follow-up after recent bilateral lower extremity multilevel complex revascularization for critical limb ischemia with tissue loss.  She has since undergone bilateral TMA's with Dr. Lajoyce Corners on 08/08/2022.  The left TMA is healed which is great.  The right TMA has some dehiscence and is requiring wound care.  She has brisk PT signals at the ankle.  No significant high grade recurrent stenosis on duplex today.  We can monitor the right external iliac moderate stenosis.  I will see her in 1 month for wound check.  Discussed she continue aspirin statin Plavix for risk reduction and stent patency.  She has follow-up with Dr. Lajoyce Corners in several weeks.  Cephus Shelling, MD [facility] Office: [phone]

## 2022-10-17 ENCOUNTER — Ambulatory Visit (INDEPENDENT_AMBULATORY_CARE_PROVIDER_SITE_OTHER): Payer: Managed Care, Other (non HMO) | Admitting: Family

## 2022-10-17 DIAGNOSIS — T8781 Dehiscence of amputation stump: Secondary | ICD-10-CM

## 2022-10-21 NOTE — Progress Notes (Signed)
[address]       [place] [address]             [phone]           [facility] Medical Record #[id number] Date of Birth: 1966-02-20  [name], MD Medicine, [facility] Internal  Chief Complaint: FU CAD/AS    History of Present Illness:     Pt known to me from previous NSTEMI admission earlier this year with associated AS diagnosis. Due to PVD and recent amputations, it was felt best to follow pt. She is doing well and not having any CP or DOE. She is active. She has recently seen her vascular surgeon and R trans met amp still with partial dehiscence and with good blood flow. She had repeat echo today with no official read looks unchanged to me from earlier echo.       Past Medical History:  Diagnosis Date   Arthritis    Critical limb ischemia of both lower extremities (HCC)    Diabetes mellitus without complication (HCC)    type 2   GERD (gastroesophageal reflux disease)    Hypercholesteremia    Hypertension    Hypothyroidism    PAD (peripheral artery disease) (HCC)    PONV (postoperative nausea and vomiting)    Thyroid disease     Past Surgical History:  Procedure Laterality Date   ABDOMINAL AORTOGRAM W/LOWER EXTREMITY Bilateral 01/14/2020   Procedure: ABDOMINAL AORTOGRAM W/LOWER EXTREMITY;  Surgeon: Cephus Shelling, MD;  [facility];  Service: Cardiovascular;  Laterality: Bilateral;   ABDOMINAL AORTOGRAM W/LOWER EXTREMITY N/A 07/05/2022   Procedure: ABDOMINAL AORTOGRAM W/LOWER EXTREMITY;  Surgeon: Cephus Shelling, MD;  [facility];  Service: Cardiovascular;  Laterality: N/A;   ABDOMINAL AORTOGRAM W/LOWER EXTREMITY N/A 08/02/2022   Procedure: ABDOMINAL AORTOGRAM W/LOWER EXTREMITY;  Surgeon: Cephus Shelling, MD;  [facility];  Service: Cardiovascular;  Laterality: N/A;   ABDOMINAL AORTOGRAM W/LOWER EXTREMITY N/A 08/06/2022   Procedure: ABDOMINAL AORTOGRAM  W/LOWER EXTREMITY;  Surgeon: Cephus Shelling, MD;  [facility];  Service: Cardiovascular;  Laterality: N/A;   AMPUTATION Bilateral 07/20/2022   Procedure: AMPUTATION RIGHT GREAT TOE AND SECOND TOE, AMPUTATION LEFT GREAT TOE;  Surgeon: Nadara Mustard, MD;  [facility];  Service: Orthopedics;  Laterality: Bilateral;   AMPUTATION Bilateral 08/08/2022   Procedure: BILATERAL TRANSMETATARSAL AMPUTATION;  Surgeon: Nadara Mustard, MD;  Location: [facility] OR;  Service: Orthopedics;  Laterality: Bilateral;   AMPUTATION TOE     PERIPHERAL VASCULAR ATHERECTOMY Right 01/14/2020   Procedure: PERIPHERAL VASCULAR ATHERECTOMY;  Surgeon: Cephus Shelling, MD;  Location: [facility] INVASIVE CV LAB;  Service: Cardiovascular;  Laterality: Right;  sfa   PERIPHERAL VASCULAR INTERVENTION Right 01/14/2020   Procedure: PERIPHERAL VASCULAR INTERVENTION;  Surgeon: Cephus Shelling, MD;  [facility];  Service: Cardiovascular;  Laterality: Right;  sfa stent    PERIPHERAL VASCULAR INTERVENTION  07/05/2022   Procedure: PERIPHERAL VASCULAR INTERVENTION;  Surgeon: Cephus Shelling, MD;  [facility];  Service: Cardiovascular;;   PERIPHERAL VASCULAR INTERVENTION  08/02/2022   Procedure: PERIPHERAL VASCULAR INTERVENTION;  Surgeon: Cephus Shelling, MD;  [facility];  Service: Cardiovascular;;   PERIPHERAL VASCULAR INTERVENTION  08/06/2022   Procedure: PERIPHERAL VASCULAR INTERVENTION;  Surgeon: Cephus Shelling, MD;  Location[facility] INVASIVE CV LAB;  Service: Cardiovascular;;   PERIPHERAL VASCULAR  THROMBECTOMY  08/02/2022   Procedure: PERIPHERAL VASCULAR THROMBECTOMY;  Surgeon: Cephus Shelling, MD;  [facility] INVASIVE CV LAB;  Service: Cardiovascular;;   RIGHT/LEFT HEART CATH AND CORONARY ANGIOGRAPHY N/A 08/23/2022   Procedure: RIGHT/LEFT HEART CATH AND CORONARY ANGIOGRAPHY;  Surgeon: [name], MD;  Location: [facility] INVASIVE CV LAB;  Service:  Cardiovascular;  Laterality: N/A;   SKIN SPLIT GRAFT Right 03/18/2020   Procedure: SKIN GRAFTING RIGHT FOOT ULCER;  Surgeon: Nadara Mustard, MD;  Location: [facility] OR;  Service: Orthopedics;  Laterality: Right;   TEE WITHOUT CARDIOVERSION N/A 08/27/2022   Procedure: TRANSESOPHAGEAL ECHOCARDIOGRAM;  Surgeon: [name], MD;  Location: [facility] INVASIVE CV LAB;  Service: Cardiovascular;  Laterality: N/A;   WISDOM TOOTH EXTRACTION      Social History   Tobacco Use  Smoking Status Former   Types: Cigarettes   Quit date: 01/2017   Years since quitting: 5.7  Smokeless Tobacco Never    Social History   Substance and Sexual Activity  Alcohol Use Not Currently    Social History   Socioeconomic History   Marital status: Divorced    Spouse name: Not on file   Number of children: 0   Years of education: Not on file   Highest education level: Not on file  Occupational History   Not on file  Tobacco Use   Smoking status: Former    Types: Cigarettes    Quit date: 01/2017    Years since quitting: 5.7   Smokeless tobacco: Never  Vaping Use   Vaping Use: Never used  Substance and Sexual Activity   Alcohol use: Not Currently   Drug use: Yes    Types: Marijuana   Sexual activity: Yes    Birth control/protection: Post-menopausal  Other Topics Concern   Not on file  Social History Narrative   Not on file   Social Determinants of Health   Financial Resource Strain: Not on file  Food Insecurity: No Food Insecurity (08/20/2022)   Hunger Vital Sign    Worried About Running Out of Food in the Last Year: Never true    Ran Out of Food in the Last Year: Never true  Transportation Needs: No Transportation Needs (08/20/2022)   PRAPARE - Administrator, Civil Service (Medical): No    Lack of Transportation (Non-Medical): No  Physical Activity: Not on file  Stress: Not on file  Social Connections: Not on file  Intimate Partner Violence: Not At Risk (08/20/2022)   Humiliation,  Afraid, Rape, and Kick questionnaire    Fear of Current or Ex-Partner: No    Emotionally Abused: No    Physically Abused: No    Sexually Abused: No    Allergies  Allergen Reactions   Doxycycline Nausea And Vomiting    Extremely N/V   Trental [Pentoxifylline] Nausea And Vomiting   Penicillins Rash    Tolerated cephalosporins 07/2022     Current Outpatient Medications  Medication Sig Dispense Refill   acetaminophen (TYLENOL) 500 MG tablet Take 500-1,000 mg by mouth every 6 (six) hours as needed for moderate pain.     amLODipine (NORVASC) 10 MG tablet Take 10 mg by mouth daily.     aspirin 81 MG EC tablet Take 1 tablet by mouth daily.     atorvastatin (LIPITOR) 40 MG tablet Take 40 mg by mouth daily.     clopidogrel (PLAVIX) 75 MG tablet TAKE 1 TABLET BY MOUTH EVERY DAY WITH BREAKFAST (Patient taking differently: Take 75 mg  by mouth daily.) 90 tablet 3   diphenhydrAMINE HCl, Sleep, (ZZZQUIL) 25 MG CAPS Take 25 mg by mouth at bedtime as needed (Sleep).     famotidine (PEPCID) 20 MG tablet Take 20 mg by mouth daily.     furosemide (LASIX) 40 MG tablet Take 1 tablet (40 mg total) by mouth 2 (two) times daily. 30 tablet 3   gabapentin (NEURONTIN) 300 MG capsule Take 300 mg by mouth at bedtime.     insulin degludec (TRESIBA FLEXTOUCH) 100 UNIT/ML FlexTouch Pen Inject 10 Units into the skin at bedtime. (Patient taking differently: Inject 12-18 Units into the skin at bedtime.) 3 mL 3   levothyroxine (SYNTHROID) 150 MCG tablet Take 150 mcg by mouth daily.     metoprolol succinate (TOPROL-XL) 25 MG 24 hr tablet Take 0.5 tablets (12.5 mg total) by mouth daily. 30 tablet 1   pantoprazole (PROTONIX) 40 MG tablet Take 40 mg by mouth daily.     Semaglutide, 1 MG/DOSE, (OZEMPIC, 1 MG/DOSE,) 2 MG/1.5ML SOPN Inject 1 mg into the skin every Saturday.     Zinc 50 MG TABS Take by mouth.     No current facility-administered medications for this visit.     Family History  Problem Relation Age of Onset    Diabetes Other    CAD Other        Physical Exam:      Diagnostic Studies & Laboratory data: I have personally reviewed the following studies and agree with the findings     Recent Radiology Findings:       Recent Lab Findings: Lab Results  Component Value Date   WBC 4.9 08/25/2022   HGB 7.8 (L) 08/25/2022   HCT 25.9 (L) 08/25/2022   PLT 327 08/25/2022   GLUCOSE 132 (H) 08/25/2022   CHOL 92 08/03/2022   TRIG 127 08/03/2022   HDL 13 (L) 08/03/2022   LDLCALC 54 08/03/2022   ALT 14 08/06/2022   AST 17 08/06/2022   NA 136 08/25/2022   K 3.9 08/25/2022   CL 96 (L) 08/25/2022   CREATININE 1.28 (H) 08/25/2022   BUN 22 (H) 08/25/2022   CO2 28 08/25/2022   INR 1.2 08/18/2022   HGBA1C 6.4 (H) 08/02/2022      Assessment / Plan:     Pt with NYHA class 0 symptoms of CAD and moderate AS with depressed LV function. Pt continues to be adequetly managed medically. She is seeing cardiology next month. Will continue to have medical management of her CAD/AS until Cardiology feels indications exist to proceed with surgery. Pt is agreeable with plan   I have spent 25 min in review of the records, viewing studies and in face to face with patient and in coordination of future care    Eugenio Hoes 10/21/2022 11:24 AM

## 2022-10-22 ENCOUNTER — Ambulatory Visit (HOSPITAL_COMMUNITY): Payer: Managed Care, Other (non HMO) | Attending: Cardiology

## 2022-10-22 ENCOUNTER — Ambulatory Visit (INDEPENDENT_AMBULATORY_CARE_PROVIDER_SITE_OTHER): Payer: Managed Care, Other (non HMO) | Admitting: Thoracic Surgery (Cardiothoracic Vascular Surgery)

## 2022-10-22 ENCOUNTER — Encounter: Payer: Self-pay | Admitting: Thoracic Surgery (Cardiothoracic Vascular Surgery)

## 2022-10-22 VITALS — BP 150/76 | HR 78 | Resp 20 | Ht 64.0 in | Wt 204.0 lb

## 2022-10-22 DIAGNOSIS — I517 Cardiomegaly: Secondary | ICD-10-CM | POA: Diagnosis not present

## 2022-10-22 DIAGNOSIS — I080 Rheumatic disorders of both mitral and aortic valves: Secondary | ICD-10-CM | POA: Diagnosis not present

## 2022-10-22 DIAGNOSIS — I340 Nonrheumatic mitral (valve) insufficiency: Secondary | ICD-10-CM | POA: Insufficient documentation

## 2022-10-22 DIAGNOSIS — I5030 Unspecified diastolic (congestive) heart failure: Secondary | ICD-10-CM

## 2022-10-22 LAB — ECHOCARDIOGRAM COMPLETE
AR max vel: 1.39 cm2
AV Area VTI: 1.27 cm2
AV Area mean vel: 1.23 cm2
AV Mean grad: 21 mmHg
AV Peak grad: 31.7 mmHg
Ao pk vel: 2.82 m/s
Area-P 1/2: 3.65 cm2
MV M vel: 4.53 m/s
MV Peak grad: 82.1 mmHg
P 1/2 time: 414 msec
Radius: 0.8 cm
S' Lateral: 3.9 cm

## 2022-10-22 NOTE — Patient Instructions (Signed)
See Cardiology and establish ongoing follow up with them Return when indications present themselves for surgical intervention

## 2022-10-23 ENCOUNTER — Encounter: Payer: Self-pay | Admitting: Orthopedic Surgery

## 2022-10-23 ENCOUNTER — Ambulatory Visit (INDEPENDENT_AMBULATORY_CARE_PROVIDER_SITE_OTHER): Payer: Managed Care, Other (non HMO) | Admitting: Orthopedic Surgery

## 2022-10-23 DIAGNOSIS — Z89439 Acquired absence of unspecified foot: Secondary | ICD-10-CM

## 2022-10-23 NOTE — Progress Notes (Signed)
Office Visit Note   Patient: Kayla Schmitt           Date of Birth: 05-10-1965           MRN: [id number] Visit Date: 10/23/2022              Requested by: Medicine, [facility] Internal [address] [place],  [place] [address] PCP: Medicine, [facility] Internal  Chief Complaint  Patient presents with   Right Foot - Routine Post Op    08/08/2022 bilateral TMAs   Left Foot - Routine Post Op      HPI: Patient is a 57 year old woman who is 2 and half months status post bilateral transmetatarsal amputations.  The left foot is healed the right foot has an open wound with drainage.  She is on Bactrim DS.  Assessment & Plan: Visit Diagnoses:  1. History of transmetatarsal amputation of foot (HCC)     Plan: Will plan for revision of the transmetatarsal amputation on the right.  Patient would like to proceed on July 19.  We will set this up at her convenience.  Follow-Up Instructions: No follow-ups on file.   Ortho Exam  Patient is alert, oriented, no adenopathy, well-dressed, normal affect, normal respiratory effort. Examination there is no cellulitis no drainage in the right foot .  There is a large ulcer that was debrided with a 10 blade knife after debridement the wound is 8 x 3 cm.  The wound extends down to bone. Imaging: No results found. No images are attached to the encounter.  Labs: Lab Results  Component Value Date   HGBA1C 6.4 (H) 08/02/2022   HGBA1C 11.0 (H) 01/11/2020   ESRSEDRATE 136 (H) 01/14/2020   ESRSEDRATE 126 (H) 01/13/2020   ESRSEDRATE 125 (H) 01/12/2020   CRP 11.3 (H) 01/14/2020   CRP 13.0 (H) 01/13/2020   CRP 15.8 (H) 01/12/2020   LABURIC 7.7 (H) 02/04/2022   REPTSTATUS 08/07/2022 FINAL 08/02/2022   CULT  08/02/2022    NO GROWTH 5 DAYS Performed at [facility], [address], [place], [place] [address]      Lab Results  Component Value Date   ALBUMIN 1.6 (L) 08/06/2022   ALBUMIN 1.7 (L) 08/05/2022   ALBUMIN 1.9 (L) 08/04/2022    Lab  Results  Component Value Date   MG 1.9 08/25/2022   MG 1.7 08/24/2022   MG 1.7 08/23/2022   No results found for: "VD25OH"  No results found for: "PREALBUMIN"    Latest Ref Rng & Units 08/25/2022    2:35 AM 08/24/2022    3:09 AM 08/23/2022    9:26 AM  CBC EXTENDED  WBC 4.0 - 10.5 K/uL 4.9  4.6    RBC 3.87 - 5.11 MIL/uL 2.83  2.74    Hemoglobin 12.0 - 15.0 g/dL 7.8  7.7  7.8   HCT 46.9 - 46.0 % 25.9  24.4  23.0   Platelets 150 - 400 K/uL 327  298       There is no height or weight on file to calculate BMI.  Orders:  No orders of the defined types were placed in this encounter.  No orders of the defined types were placed in this encounter.    Procedures: No procedures performed  Clinical Data: No additional findings.  ROS:  All other systems negative, except as noted in the HPI. Review of Systems  Objective: Vital Signs: There were no vitals taken for this visit.  Specialty Comments:  No specialty  comments available.  PMFS History: Patient Active Problem List   Diagnosis Date Noted   Severe mitral regurgitation by prior echocardiogram 08/27/2022   NSTEMI (non-ST elevated myocardial infarction) (HCC) 08/18/2022   Acute clinical systolic heart failure (HCC) 08/18/2022   Acute blood loss anemia 08/18/2022   Anemia 08/18/2022   Critical limb ischemia of both lower extremities (HCC) 06/19/2022   DM type 2 (diabetes mellitus, type 2) (HCC) 01/28/2020   Hyperlipidemia 01/28/2020   Hypertension 01/28/2020   Hypothyroidism 01/28/2020   Obesity 01/28/2020   Ulcerated, foot, right, limited to breakdown of skin (HCC)    Cellulitis of right foot    Peripheral arterial disease (HCC)    Cellulitis 01/11/2020   Blister of leg 01/06/2020   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/28/2017   Chest pain 10/28/2017   GERD (gastroesophageal reflux disease) 10/28/2017   Past Medical History:  Diagnosis Date   Arthritis    Critical limb ischemia of both lower extremities  (HCC)    Diabetes mellitus without complication (HCC)    type 2   GERD (gastroesophageal reflux disease)    Hypercholesteremia    Hypertension    Hypothyroidism    PAD (peripheral artery disease) (HCC)    PONV (postoperative nausea and vomiting)    Thyroid disease     Family History  Problem Relation Age of Onset   Diabetes Other    CAD Other     Past Surgical History:  Procedure Laterality Date   ABDOMINAL AORTOGRAM W/LOWER EXTREMITY Bilateral 01/14/2020   Procedure: ABDOMINAL AORTOGRAM W/LOWER EXTREMITY;  Surgeon: Cephus Shelling, MD;  [facility];  Service: Cardiovascular;  Laterality: Bilateral;   ABDOMINAL AORTOGRAM W/LOWER EXTREMITY N/A 07/05/2022   Procedure: ABDOMINAL AORTOGRAM W/LOWER EXTREMITY;  Surgeon: Cephus Shelling, MD;  [facility];  Service: Cardiovascular;  Laterality: N/A;   ABDOMINAL AORTOGRAM W/LOWER EXTREMITY N/A 08/02/2022   Procedure: ABDOMINAL AORTOGRAM W/LOWER EXTREMITY;  Surgeon: Cephus Shelling, MD;  [facility];  Service: Cardiovascular;  Laterality: N/A;   ABDOMINAL AORTOGRAM W/LOWER EXTREMITY N/A 08/06/2022   Procedure: ABDOMINAL AORTOGRAM W/LOWER EXTREMITY;  Surgeon: Cephus Shelling, MD;  [facility];  Service: Cardiovascular;  Laterality: N/A;   AMPUTATION Bilateral 07/20/2022   Procedure: AMPUTATION RIGHT GREAT TOE AND SECOND TOE, AMPUTATION LEFT GREAT TOE;  Surgeon: Nadara Mustard, MD;  [facility];  Service: Orthopedics;  Laterality: Bilateral;   AMPUTATION Bilateral 08/08/2022   Procedure: BILATERAL TRANSMETATARSAL AMPUTATION;  Surgeon: Nadara Mustard, MD;  Location: [facility] OR;  Service: Orthopedics;  Laterality: Bilateral;   AMPUTATION TOE     PERIPHERAL VASCULAR ATHERECTOMY Right 01/14/2020   Procedure: PERIPHERAL VASCULAR ATHERECTOMY;  Surgeon: Cephus Shelling, MD;  Location: [facility] INVASIVE CV LAB;  Service: Cardiovascular;  Laterality: Right;  sfa   PERIPHERAL  VASCULAR INTERVENTION Right 01/14/2020   Procedure: PERIPHERAL VASCULAR INTERVENTION;  Surgeon: Cephus Shelling, MD;  [facility];  Service: Cardiovascular;  Laterality: Right;  sfa stent    PERIPHERAL VASCULAR INTERVENTION  07/05/2022   Procedure: PERIPHERAL VASCULAR INTERVENTION;  Surgeon: Cephus Shelling, MD;  [facility];  Service: Cardiovascular;;   PERIPHERAL VASCULAR INTERVENTION  08/02/2022   Procedure: PERIPHERAL VASCULAR INTERVENTION;  Surgeon: Cephus Shelling, MD;  [facility];  Service: Cardiovascular;;   PERIPHERAL VASCULAR INTERVENTION  08/06/2022   Procedure: PERIPHERAL VASCULAR INTERVENTION;  Surgeon: Cephus Shelling, MD;  [facility];  Service: Cardiovascular;;   PERIPHERAL VASCULAR THROMBECTOMY  08/02/2022   Procedure: PERIPHERAL VASCULAR THROMBECTOMY;  Surgeon: Cephus Shelling, MD;  [facility];  Service: Cardiovascular;;   RIGHT/LEFT HEART CATH AND CORONARY ANGIOGRAPHY N/A 08/23/2022   Procedure: RIGHT/LEFT HEART CATH AND CORONARY ANGIOGRAPHY;  Surgeon: [name], MD;  Location: [facility] INVASIVE CV LAB;  Service: Cardiovascular;  Laterality: N/A;   SKIN SPLIT GRAFT Right 03/18/2020   Procedure: SKIN GRAFTING RIGHT FOOT ULCER;  Surgeon: Nadara Mustard, MD;  Location: [facility] OR;  Service: Orthopedics;  Laterality: Right;   TEE WITHOUT CARDIOVERSION N/A 08/27/2022   Procedure: TRANSESOPHAGEAL ECHOCARDIOGRAM;  Surgeon: [name], MD;  Location: [facility] INVASIVE CV LAB;  Service: Cardiovascular;  Laterality: N/A;   WISDOM TOOTH EXTRACTION     Social History   Occupational History   Not on file  Tobacco Use   Smoking status: Former    Types: Cigarettes    Quit date: 01/2017    Years since quitting: 5.7   Smokeless tobacco: Never  Vaping Use   Vaping Use: Never used  Substance and Sexual Activity   Alcohol use: Not Currently   Drug use: Yes    Types: Marijuana    Sexual activity: Yes    Birth control/protection: Post-menopausal

## 2022-11-02 ENCOUNTER — Encounter: Payer: Self-pay | Admitting: Family

## 2022-11-02 NOTE — Progress Notes (Signed)
Post-Op Visit Note   Patient: Kayla Schmitt           Date of Birth: 1965-10-27           MRN: [id number] Visit Date: 10/17/2022 PCP: Medicine, [facility]  Chief Complaint:  Chief Complaint  Patient presents with   Right Foot - Routine Post Op    08/08/2022 bilat TMA    Left Foot - Routine Post Op    HPI:  HPI The patient is a 57 year old woman who presents in follow-up status post bilateral transmetatarsal amputations April 10  She is concerned about slow or poor healing to the right foot.  She was seen at Dr. Ophelia Charter office 1 week ago he debrided some of the nonviable tissue from her right transmetatarsal amputation she continues to have pain redness and swelling she has been doing wet-to-dry dressings packing open the area of dehiscence of the right foot. Ortho Exam On examination of the right transmetatarsal amputation this unfortunately has areas of dehiscence the lateral aspect of the right transmetatarsal amputation does probe towards the midline about 1 cm deep there is fibrinous exudative tissue few drops of serous drainage does not probe to bone today there is mild surrounding erythema chronic edema  Visit Diagnoses: No diagnosis found.  Plan: Will begin packing open with silver cell compression with Ace elevate nonweightbearing follow-up in office in 1 week.  Concern for need for further limb salvage surgery will discuss with Dr. Lajoyce Corners  Follow-Up Instructions: No follow-ups on file.   Imaging: No results found.  Orders:  No orders of the defined types were placed in this encounter.  No orders of the defined types were placed in this encounter.    PMFS History: Patient Active Problem List   Diagnosis Date Noted   Severe mitral regurgitation by prior echocardiogram 08/27/2022   NSTEMI (non-ST elevated myocardial infarction) (HCC) 08/18/2022   Acute clinical systolic heart failure (HCC) 08/18/2022   Acute blood loss anemia 08/18/2022   Anemia  08/18/2022   Critical limb ischemia of both lower extremities (HCC) 06/19/2022   DM type 2 (diabetes mellitus, type 2) (HCC) 01/28/2020   Hyperlipidemia 01/28/2020   Hypertension 01/28/2020   Hypothyroidism 01/28/2020   Obesity 01/28/2020   Ulcerated, foot, right, limited to breakdown of skin (HCC)    Cellulitis of right foot    Peripheral arterial disease (HCC)    Cellulitis 01/11/2020   Blister of leg 01/06/2020   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/28/2017   Chest pain 10/28/2017   GERD (gastroesophageal reflux disease) 10/28/2017   Past Medical History:  Diagnosis Date   Arthritis    Critical limb ischemia of both lower extremities (HCC)    Diabetes mellitus without complication (HCC)    type 2   GERD (gastroesophageal reflux disease)    Hypercholesteremia    Hypertension    Hypothyroidism    PAD (peripheral artery disease) (HCC)    PONV (postoperative nausea and vomiting)    Thyroid disease     Family History  Problem Relation Age of Onset   Diabetes Other    CAD Other     Past Surgical History:  Procedure Laterality Date   ABDOMINAL AORTOGRAM W/LOWER EXTREMITY Bilateral 01/14/2020   Procedure: ABDOMINAL AORTOGRAM W/LOWER EXTREMITY;  Surgeon: Cephus Shelling, MD;  [facility];  Service: Cardiovascular;  Laterality: Bilateral;   ABDOMINAL AORTOGRAM W/LOWER EXTREMITY N/A 07/05/2022   Procedure: ABDOMINAL AORTOGRAM W/LOWER EXTREMITY;  Surgeon: Cephus Shelling,  MD;  [facility];  Service: Cardiovascular;  Laterality: N/A;   ABDOMINAL AORTOGRAM W/LOWER EXTREMITY N/A 08/02/2022   Procedure: ABDOMINAL AORTOGRAM W/LOWER EXTREMITY;  Surgeon: Cephus Shelling, MD;  [facility];  Service: Cardiovascular;  Laterality: N/A;   ABDOMINAL AORTOGRAM W/LOWER EXTREMITY N/A 08/06/2022   Procedure: ABDOMINAL AORTOGRAM W/LOWER EXTREMITY;  Surgeon: Cephus Shelling, MD;  [facility];  Service:  Cardiovascular;  Laterality: N/A;   AMPUTATION Bilateral 07/20/2022   Procedure: AMPUTATION RIGHT GREAT TOE AND SECOND TOE, AMPUTATION LEFT GREAT TOE;  Surgeon: Nadara Mustard, MD;  [facility];  Service: Orthopedics;  Laterality: Bilateral;   AMPUTATION Bilateral 08/08/2022   Procedure: BILATERAL TRANSMETATARSAL AMPUTATION;  Surgeon: Nadara Mustard, MD;  Location: [facility] OR;  Service: Orthopedics;  Laterality: Bilateral;   AMPUTATION TOE     PERIPHERAL VASCULAR ATHERECTOMY Right 01/14/2020   Procedure: PERIPHERAL VASCULAR ATHERECTOMY;  Surgeon: Cephus Shelling, MD;  Location: [facility] INVASIVE CV LAB;  Service: Cardiovascular;  Laterality: Right;  sfa   PERIPHERAL VASCULAR INTERVENTION Right 01/14/2020   Procedure: PERIPHERAL VASCULAR INTERVENTION;  Surgeon: Cephus Shelling, MD;  [facility];  Service: Cardiovascular;  Laterality: Right;  sfa stent    PERIPHERAL VASCULAR INTERVENTION  07/05/2022   Procedure: PERIPHERAL VASCULAR INTERVENTION;  Surgeon: Cephus Shelling, MD;  [facility];  Service: Cardiovascular;;   PERIPHERAL VASCULAR INTERVENTION  08/02/2022   Procedure: PERIPHERAL VASCULAR INTERVENTION;  Surgeon: Cephus Shelling, MD;  [facility];  Service: Cardiovascular;;   PERIPHERAL VASCULAR INTERVENTION  08/06/2022   Procedure: PERIPHERAL VASCULAR INTERVENTION;  Surgeon: Cephus Shelling, MD;  Location: [facility] INVASIVE CV LAB;  Service: Cardiovascular;;   PERIPHERAL VASCULAR THROMBECTOMY  08/02/2022   Procedure: PERIPHERAL VASCULAR THROMBECTOMY;  Surgeon: Cephus Shelling, MD;  [facility];  Service: Cardiovascular;;   RIGHT/LEFT HEART CATH AND CORONARY ANGIOGRAPHY N/A 08/23/2022   Procedure: RIGHT/LEFT HEART CATH AND CORONARY ANGIOGRAPHY;  Surgeon: [name], MD;  Location: [facility] INVASIVE CV LAB;  Service: Cardiovascular;  Laterality: N/A;   SKIN SPLIT GRAFT Right 03/18/2020   Procedure: SKIN GRAFTING RIGHT  FOOT ULCER;  Surgeon: Nadara Mustard, MD;  Location: [facility] OR;  Service: Orthopedics;  Laterality: Right;   TEE WITHOUT CARDIOVERSION N/A 08/27/2022   Procedure: TRANSESOPHAGEAL ECHOCARDIOGRAM;  Surgeon: [name], MD;  Location: [facility] INVASIVE CV LAB;  Service: Cardiovascular;  Laterality: N/A;   WISDOM TOOTH EXTRACTION     Social History   Occupational History   Not on file  Tobacco Use   Smoking status: Former    Types: Cigarettes    Quit date: 01/2017    Years since quitting: 5.7   Smokeless tobacco: Never  Vaping Use   Vaping Use: Never used  Substance and Sexual Activity   Alcohol use: Not Currently   Drug use: Yes    Types: Marijuana   Sexual activity: Yes    Birth control/protection: Post-menopausal

## 2022-11-06 ENCOUNTER — Ambulatory Visit: Payer: Managed Care, Other (non HMO) | Attending: Internal Medicine | Admitting: Internal Medicine

## 2022-11-06 ENCOUNTER — Encounter: Payer: Self-pay | Admitting: Internal Medicine

## 2022-11-06 VITALS — BP 126/70 | HR 77 | Ht 64.0 in | Wt 200.8 lb

## 2022-11-06 DIAGNOSIS — I2510 Atherosclerotic heart disease of native coronary artery without angina pectoris: Secondary | ICD-10-CM | POA: Diagnosis not present

## 2022-11-06 DIAGNOSIS — I350 Nonrheumatic aortic (valve) stenosis: Secondary | ICD-10-CM | POA: Diagnosis not present

## 2022-11-06 DIAGNOSIS — I739 Peripheral vascular disease, unspecified: Secondary | ICD-10-CM

## 2022-11-06 MED ORDER — NITROGLYCERIN 0.4 MG SL SUBL: 0.4000 mg | SUBLINGUAL_TABLET | SUBLINGUAL | 1 refills | Status: DC | PRN

## 2022-11-06 MED ORDER — LISINOPRIL 2.5 MG PO TABS: 2.5000 mg | ORAL_TABLET | Freq: Every day | ORAL | 2 refills | Status: DC

## 2022-11-06 NOTE — Patient Instructions (Addendum)
Medication Instructions:  Your physician has recommended you make the following change in your medication:  Start taking Lisinopril 2.5 mg once a day Nitroglycerin 0.4 mg Dissolve one under tongue for chest pain every 5 minutes up to 3 doses. If no relief, proceed to ED.  Continue all other medications as prescribed  Labwork: BMET to be done a Primary Care in a month  Testing/Procedures: None  Follow-Up: Your physician recommends that you schedule a follow-up appointment in: 6 months  Any Other Special Instructions Will Be Listed Below (If Applicable).  If you need a refill on your cardiac medications before your next appointment, please call your pharmacy.

## 2022-11-06 NOTE — Progress Notes (Signed)
Cardiology Office Note  Date: 11/06/2022   ID: Quadirah Holden, DOB 24-Jun-1965, MRN [id number]  PCP:  Medicine, [facility]  Cardiologist:  Marjo Bicker, MD Electrophysiologist:  None   Reason for Office Visit: Management of CAD   History of Present Illness: Kayla Schmitt is a 57 y.o. female known to have CAD manifested by NSTEMI perioperatively in 4/24 with severe calcific multivessel CAD and low normal LVEF 50 to 55%, PAD s/p metatarsal amputations, HLD, HTN, DM 2 was referred to cardiology clinic for management of CAD.  Patient walks with a cane, not much active but tries to exercise in a seated position. She was using Ozempic for a long time and lost 76 pounds. No interval angina or DOE since 4/24.  No dizziness, palpitations, syncope.  Has minimal leg swelling.  Past Medical History:  Diagnosis Date   Arthritis    Critical limb ischemia of both lower extremities (HCC)    Diabetes mellitus without complication (HCC)    type 2   GERD (gastroesophageal reflux disease)    Hypercholesteremia    Hypertension    Hypothyroidism    PAD (peripheral artery disease) (HCC)    PONV (postoperative nausea and vomiting)    Thyroid disease     Past Surgical History:  Procedure Laterality Date   ABDOMINAL AORTOGRAM W/LOWER EXTREMITY Bilateral 01/14/2020   Procedure: ABDOMINAL AORTOGRAM W/LOWER EXTREMITY;  Surgeon: Cephus Shelling, MD;  [facility];  Service: Cardiovascular;  Laterality: Bilateral;   ABDOMINAL AORTOGRAM W/LOWER EXTREMITY N/A 07/05/2022   Procedure: ABDOMINAL AORTOGRAM W/LOWER EXTREMITY;  Surgeon: Cephus Shelling, MD;  [facility]: [facility];  Service: Cardiovascular;  Laterality: N/A;   ABDOMINAL AORTOGRAM W/LOWER EXTREMITY N/A 08/02/2022   Procedure: ABDOMINAL AORTOGRAM W/LOWER EXTREMITY;  Surgeon: Cephus Shelling, MD;  [facility]: [facility];  Service: Cardiovascular;  Laterality: N/A;   ABDOMINAL AORTOGRAM  W/LOWER EXTREMITY N/A 08/06/2022   Procedure: ABDOMINAL AORTOGRAM W/LOWER EXTREMITY;  Surgeon: Cephus Shelling, MD;  [facility]: [facility];  Service: Cardiovascular;  Laterality: N/A;   AMPUTATION Bilateral 07/20/2022   Procedure: AMPUTATION RIGHT GREAT TOE AND SECOND TOE, AMPUTATION LEFT GREAT TOE;  Surgeon: Nadara Mustard, MD;  [facility]: [facility];  Service: Orthopedics;  Laterality: Bilateral;   AMPUTATION Bilateral 08/08/2022   Procedure: BILATERAL TRANSMETATARSAL AMPUTATION;  Surgeon: Nadara Mustard, MD;  Location: [facility] OR;  Service: Orthopedics;  Laterality: Bilateral;   AMPUTATION TOE     PERIPHERAL VASCULAR ATHERECTOMY Right 01/14/2020   Procedure: PERIPHERAL VASCULAR ATHERECTOMY;  Surgeon: Cephus Shelling, MD;  Location: [facility] INVASIVE CV LAB;  Service: Cardiovascular;  Laterality: Right;  sfa   PERIPHERAL VASCULAR INTERVENTION Right 01/14/2020   Procedure: PERIPHERAL VASCULAR INTERVENTION;  Surgeon: Cephus Shelling, MD;  [facility];  Service: Cardiovascular;  Laterality: Right;  sfa stent    PERIPHERAL VASCULAR INTERVENTION  07/05/2022   Procedure: PERIPHERAL VASCULAR INTERVENTION;  Surgeon: Cephus Shelling, MD;  [facility];  Service: Cardiovascular;;   PERIPHERAL VASCULAR INTERVENTION  08/02/2022   Procedure: PERIPHERAL VASCULAR INTERVENTION;  Surgeon: Cephus Shelling, MD;  [facility];  Service: Cardiovascular;;   PERIPHERAL VASCULAR INTERVENTION  08/06/2022   Procedure: PERIPHERAL VASCULAR INTERVENTION;  Surgeon: Cephus Shelling, MD;  Location: [facility] INVASIVE CV LAB;  Service: Cardiovascular;;   PERIPHERAL VASCULAR THROMBECTOMY  08/02/2022   Procedure: PERIPHERAL VASCULAR THROMBECTOMY;  Surgeon: Cephus Shelling, MD;  [facility];  Service: Cardiovascular;;   RIGHT/LEFT HEART CATH AND CORONARY ANGIOGRAPHY N/A 08/23/2022   Procedure: RIGHT/LEFT HEART CATH AND CORONARY ANGIOGRAPHY;  Surgeon: [name], MD;  Location: [facility] INVASIVE CV LAB;  Service: Cardiovascular;  Laterality: N/A;   SKIN SPLIT GRAFT Right 03/18/2020   Procedure: SKIN GRAFTING RIGHT FOOT ULCER;  Surgeon: Nadara Mustard, MD;  Location: [facility] OR;  Service: Orthopedics;  Laterality: Right;   TEE WITHOUT CARDIOVERSION N/A 08/27/2022   Procedure: TRANSESOPHAGEAL ECHOCARDIOGRAM;  Surgeon: [name], MD;  Location: [facility] INVASIVE CV LAB;  Service: Cardiovascular;  Laterality: N/A;   WISDOM TOOTH EXTRACTION      Current Outpatient Medications  Medication Sig Dispense Refill   acetaminophen (TYLENOL) 500 MG tablet Take 500-1,000 mg by mouth every 6 (six) hours as needed for moderate pain.     amLODipine (NORVASC) 10 MG tablet Take 10 mg by mouth daily.     aspirin 81 MG EC tablet Take 1 tablet by mouth daily.     atorvastatin (LIPITOR) 40 MG tablet Take 40 mg by mouth daily.     clopidogrel (PLAVIX) 75 MG tablet TAKE 1 TABLET BY MOUTH EVERY DAY WITH BREAKFAST (Patient taking differently: Take 75 mg by mouth daily.) 90 tablet 3   diphenhydrAMINE HCl, Sleep, (ZZZQUIL) 25 MG CAPS Take 25 mg by mouth at bedtime as needed (Sleep).     famotidine (PEPCID) 20 MG tablet Take 20 mg by mouth daily.     furosemide (LASIX) 40 MG tablet Take 1 tablet (40 mg total) by mouth 2 (two) times daily. 30 tablet 3   gabapentin (NEURONTIN) 300 MG capsule Take 300 mg by mouth at bedtime.     insulin degludec (TRESIBA FLEXTOUCH) 100 UNIT/ML FlexTouch Pen Inject 10 Units into the skin at bedtime. (Patient taking differently: Inject 12-18 Units into the skin at bedtime.) 3 mL 3   levothyroxine (SYNTHROID) 150 MCG tablet Take 150 mcg by mouth daily.     lisinopril (ZESTRIL) 2.5 MG tablet Take 1 tablet (2.5 mg total) by mouth daily. 90 tablet 2   metoprolol succinate (TOPROL-XL) 25 MG 24 hr tablet Take 0.5 tablets (12.5 mg total) by mouth daily. 30 tablet 1   nitroGLYCERIN (NITROSTAT) 0.4 MG SL tablet Place 1 tablet (0.4 mg total) under the tongue  every 5 (five) minutes x 3 doses as needed for chest pain (if no relief after 3rd dose proceed to ED or call 911). 11/06/2022-New 25 tablet 1   pantoprazole (PROTONIX) 40 MG tablet Take 40 mg by mouth daily.     Semaglutide, 1 MG/DOSE, (OZEMPIC, 1 MG/DOSE,) 2 MG/1.5ML SOPN Inject 1 mg into the skin every Saturday.     Zinc 50 MG TABS Take by mouth.     No current facility-administered medications for this visit.   Allergies:  Doxycycline, Trental [pentoxifylline], and Penicillins   Social History: The patient  reports that she quit smoking about 5 years ago. Her smoking use included cigarettes. She has never used smokeless tobacco. She reports that she does not currently use alcohol. She reports current drug use. Drug: Marijuana.   Family History: The patient's family history includes CAD in an other family member; Diabetes in an other family member.   ROS:  Please see the history of present illness. Otherwise, complete review of systems is positive for none  All other systems are reviewed and negative.   Physical Exam: VS:  BP 126/70   Pulse 77   Ht  5\' 4"  (1.626 m)   Wt 200 lb 12.8 oz (91.1 kg)   SpO2 97%   BMI 34.47 kg/m , BMI Body mass index is 34.47 kg/m.  Wt Readings from Last 3 Encounters:  11/06/22 200 lb 12.8 oz (91.1 kg)  10/22/22 204 lb (92.5 kg)  10/09/22 202 lb (91.6 kg)    General: Patient appears comfortable at rest. HEENT: Conjunctiva and lids normal, oropharynx clear with moist mucosa. Neck: Supple, no elevated JVP or carotid bruits, no thyromegaly. Lungs: Clear to auscultation, nonlabored breathing at rest. Cardiac: Regular rate and rhythm, no S3 or significant systolic murmur, no pericardial rub. Abdomen: Soft, nontender, no hepatomegaly, bowel sounds present, no guarding or rebound. Extremities: Trace pitting edema in bilateral lower extremities, metatarsal amputations in the right lower extremity. Skin: Warm and dry. Musculoskeletal: No  kyphosis. Neuropsychiatric: Alert and oriented x3, affect grossly appropriate.  Recent Labwork: 08/06/2022: ALT 14; AST 17 08/18/2022: B Natriuretic Peptide 486.0 08/25/2022: BUN 22; Creatinine, Ser 1.28; Hemoglobin 7.8; Magnesium 1.9; Platelets 327; Potassium 3.9; Sodium 136     Component Value Date/Time   CHOL 92 08/03/2022 0056   TRIG 127 08/03/2022 0056   HDL 13 (L) 08/03/2022 0056   CHOLHDL 7.1 08/03/2022 0056   VLDL 25 08/03/2022 0056   LDLCALC 54 08/03/2022 0056    Other Studies Reviewed Today:   Assessment and Plan:  # CAD manifested by perioperative NSTEMI in 4/24 # Severe calcific 3V CAD with low normal LVEF, 50 to 55%, no interval angina -Patient had perioperative NSTEMI in 4/24 and underwent LHC that showed severe calcific multivessel CAD and severe MR by TTE. TEE showed moderate MR and moderate AS. Patient has been asymptomatic since 4/24 and CABG has been deferred.  She has no interval angina or DOE.  Continue cardio prudent medications with DAPT, aspirin 81 mg once daily and Plavix 75 mg once daily.  Continue atorvastatin 40 mg nightly. -SL NG 0.4 mg as needed  # PAD -Continue cardio prudent medications as stated above -Start lisinopril 2.5 mg once daily, quit smoking 8 years ago.  # HLD, at goal -LDL 54 in 4/24.  Continue atorvastatin 40 mg nightly.  # Valvular heart disease # Moderate MR in 6/24 # Moderate AS in 6/24 # Mild AI in 06/24 -Repeat 2D echocardiogram in 1 year  # HTN, controlled -Continue metoprolol succinate 12.5 mg once daily and amlodipine 10 mg once daily. Start lisinopril 2.5 mg once daily to prevent worsening of CKD in the setting of diabetes mellitus type 2.  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   I have spent a total of 45 minutes with patient reviewing chart, EKGs, labs and examining patient as well as establishing an assessment and plan that was discussed with the patient.  > 50% of  time was spent in direct patient care.    Medication Adjustments/Labs and Tests Ordered: Current medicines are reviewed at length with the patient today.  Concerns regarding medicines are outlined above.   Tests Ordered: Orders Placed This Encounter  Procedures   Basic metabolic panel    Medication Changes: Meds ordered this encounter  Medications   nitroGLYCERIN (NITROSTAT) 0.4 MG SL tablet    Sig: Place 1 tablet (0.4 mg total) under the tongue every 5 (five) minutes x 3 doses as needed for chest pain (if no relief after 3rd dose proceed to ED or call 911). 11/06/2022-New    Dispense:  25 tablet  Refill:  1   lisinopril (ZESTRIL) 2.5 MG tablet    Sig: Take 1 tablet (2.5 mg total) by mouth daily.    Dispense:  90 tablet    Refill:  2    11/06/2022-New    Disposition:  Follow up  6 months  Signed Casey Maxfield Verne Spurr, MD, 11/06/2022 3:09 PM    [facility] [address] [place], [place], [place] [address]

## 2022-11-13 ENCOUNTER — Ambulatory Visit (INDEPENDENT_AMBULATORY_CARE_PROVIDER_SITE_OTHER): Payer: Managed Care, Other (non HMO) | Admitting: Vascular Surgery

## 2022-11-13 ENCOUNTER — Encounter: Payer: Self-pay | Admitting: Vascular Surgery

## 2022-11-13 VITALS — BP 120/70 | HR 69 | Temp 97.6°F | Resp 14 | Ht 64.0 in | Wt 200.0 lb

## 2022-11-13 DIAGNOSIS — I70223 Atherosclerosis of native arteries of extremities with rest pain, bilateral legs: Secondary | ICD-10-CM

## 2022-11-13 NOTE — Progress Notes (Signed)
Patient name: Kayla Schmitt MRN: [id number] DOB: 03-Nov-1965 Sex: female  REASON FOR VISIT: 1 month follow-up, wound check right TMA  HPI: Kayla Schmitt is a 57 y.o. female with history of hypertension, hyperlipidemia, diabetes that presents for hospital follow-up and interval wound check.  Most recently on 08/02/2022 she underwent left SFA and above-knee popliteal percutaneous thrombectomy with JETI and additional angioplasty and stenting into the above-knee popliteal artery for an occluded distal SFA stent with tissue loss.  She then on 08/06/2022 underwent laser arthrectomy of the right SFA above-knee popliteal with DCB for in-stent stenosis.  She had bilateral TMA's with Dr. Lajoyce Corners on 08/08/2022.  The left TMA is healed.  The right TMA has been slow to heal.  She is undergoing revision on Friday in the OR for right TMA with Dr. Lajoyce Corners.  She previously underwent right leg intervention on 01/14/2020 with right SFA atherectomy and angioplasty and stent placement as well as right above-knee popliteal angioplasty.   She later underwent skin grafting with Dr. Lajoyce Corners. Her right foot wound previously healed.  Past Medical History:  Diagnosis Date   Arthritis    Critical limb ischemia of both lower extremities (HCC)    Diabetes mellitus without complication (HCC)    type 2   GERD (gastroesophageal reflux disease)    Hypercholesteremia    Hypertension    Hypothyroidism    PAD (peripheral artery disease) (HCC)    PONV (postoperative nausea and vomiting)    Thyroid disease     Past Surgical History:  Procedure Laterality Date   ABDOMINAL AORTOGRAM W/LOWER EXTREMITY Bilateral 01/14/2020   Procedure: ABDOMINAL AORTOGRAM W/LOWER EXTREMITY;  Surgeon: Cephus Shelling, MD;  [facility];  Service: Cardiovascular;  Laterality: Bilateral;   ABDOMINAL AORTOGRAM W/LOWER EXTREMITY N/A 07/05/2022   Procedure: ABDOMINAL AORTOGRAM W/LOWER EXTREMITY;  Surgeon: Cephus Shelling, MD;   [facility];  Service: Cardiovascular;  Laterality: N/A;   ABDOMINAL AORTOGRAM W/LOWER EXTREMITY N/A 08/02/2022   Procedure: ABDOMINAL AORTOGRAM W/LOWER EXTREMITY;  Surgeon: Cephus Shelling, MD;  [facility];  Service: Cardiovascular;  Laterality: N/A;   ABDOMINAL AORTOGRAM W/LOWER EXTREMITY N/A 08/06/2022   Procedure: ABDOMINAL AORTOGRAM W/LOWER EXTREMITY;  Surgeon: Cephus Shelling, MD;  [facility];  Service: Cardiovascular;  Laterality: N/A;   AMPUTATION Bilateral 07/20/2022   Procedure: AMPUTATION RIGHT GREAT TOE AND SECOND TOE, AMPUTATION LEFT GREAT TOE;  Surgeon: Nadara Mustard, MD;  [facility];  Service: Orthopedics;  Laterality: Bilateral;   AMPUTATION Bilateral 08/08/2022   Procedure: BILATERAL TRANSMETATARSAL AMPUTATION;  Surgeon: Nadara Mustard, MD;  Location: [facility] OR;  Service: Orthopedics;  Laterality: Bilateral;   AMPUTATION TOE     PERIPHERAL VASCULAR ATHERECTOMY Right 01/14/2020   Procedure: PERIPHERAL VASCULAR ATHERECTOMY;  Surgeon: Cephus Shelling, MD;  Location: [facility] INVASIVE CV LAB;  Service: Cardiovascular;  Laterality: Right;  sfa   PERIPHERAL VASCULAR INTERVENTION Right 01/14/2020   Procedure: PERIPHERAL VASCULAR INTERVENTION;  Surgeon: Cephus Shelling, MD;  [facility];  Service: Cardiovascular;  Laterality: Right;  sfa stent    PERIPHERAL VASCULAR INTERVENTION  07/05/2022   Procedure: PERIPHERAL VASCULAR INTERVENTION;  Surgeon: Cephus Shelling, MD;  [facility];  Service: Cardiovascular;;   PERIPHERAL VASCULAR INTERVENTION  08/02/2022   Procedure: PERIPHERAL VASCULAR INTERVENTION;  Surgeon: Cephus Shelling, MD;  [facility];  Service: Cardiovascular;;   PERIPHERAL VASCULAR INTERVENTION  08/06/2022  Procedure: PERIPHERAL VASCULAR INTERVENTION;  Surgeon: Cephus Shelling, MD;  Location: [facility] INVASIVE CV LAB;  Service: Cardiovascular;;   PERIPHERAL  VASCULAR THROMBECTOMY  08/02/2022   Procedure: PERIPHERAL VASCULAR THROMBECTOMY;  Surgeon: Cephus Shelling, MD;  [facility];  Service: Cardiovascular;;   RIGHT/LEFT HEART CATH AND CORONARY ANGIOGRAPHY N/A 08/23/2022   Procedure: RIGHT/LEFT HEART CATH AND CORONARY ANGIOGRAPHY;  Surgeon: [name], MD;  Location: [facility] INVASIVE CV LAB;  Service: Cardiovascular;  Laterality: N/A;   SKIN SPLIT GRAFT Right 03/18/2020   Procedure: SKIN GRAFTING RIGHT FOOT ULCER;  Surgeon: Nadara Mustard, MD;  Location: [facility] OR;  Service: Orthopedics;  Laterality: Right;   TEE WITHOUT CARDIOVERSION N/A 08/27/2022   Procedure: TRANSESOPHAGEAL ECHOCARDIOGRAM;  Surgeon: [name], MD;  Location: [facility] INVASIVE CV LAB;  Service: Cardiovascular;  Laterality: N/A;   WISDOM TOOTH EXTRACTION      Family History  Problem Relation Age of Onset   Diabetes Other    CAD Other     SOCIAL HISTORY: Social History   Tobacco Use   Smoking status: Former    Current packs/day: 0.00    Types: Cigarettes    Quit date: 01/2017    Years since quitting: 5.7   Smokeless tobacco: Never  Substance Use Topics   Alcohol use: Not Currently    Allergies  Allergen Reactions   Doxycycline Nausea And Vomiting    Extremely N/V   Trental [Pentoxifylline] Nausea And Vomiting   Penicillins Rash    Tolerated cephalosporins 07/2022     Current Outpatient Medications  Medication Sig Dispense Refill   acetaminophen (TYLENOL) 500 MG tablet Take 500-1,000 mg by mouth every 6 (six) hours as needed for moderate pain.     amLODipine (NORVASC) 10 MG tablet Take 10 mg by mouth daily.     aspirin 81 MG EC tablet Take 81 mg by mouth daily.     atorvastatin (LIPITOR) 40 MG tablet Take 40 mg by mouth daily.     clopidogrel (PLAVIX) 75 MG tablet TAKE 1 TABLET BY MOUTH EVERY DAY WITH BREAKFAST (Patient taking differently: Take 75 mg by mouth daily.) 90 tablet 3   diphenhydrAMINE HCl, Sleep, (ZZZQUIL) 25 MG CAPS Take  25 mg by mouth at bedtime as needed (Sleep).     furosemide (LASIX) 40 MG tablet Take 1 tablet (40 mg total) by mouth 2 (two) times daily. 30 tablet 3   gabapentin (NEURONTIN) 300 MG capsule Take 300 mg by mouth at bedtime.     insulin degludec (TRESIBA FLEXTOUCH) 100 UNIT/ML FlexTouch Pen Inject 10 Units into the skin at bedtime. 3 mL 3   levothyroxine (SYNTHROID) 150 MCG tablet Take 150 mcg by mouth daily.     lisinopril (ZESTRIL) 2.5 MG tablet Take 1 tablet (2.5 mg total) by mouth daily. 90 tablet 2   metoprolol succinate (TOPROL-XL) 25 MG 24 hr tablet Take 0.5 tablets (12.5 mg total) by mouth daily. 30 tablet 1   nitroGLYCERIN (NITROSTAT) 0.4 MG SL tablet Place 1 tablet (0.4 mg total) under the tongue every 5 (five) minutes x 3 doses as needed for chest pain (if no relief after 3rd dose proceed to ED or call 911). 11/06/2022-New 25 tablet 1   pantoprazole (PROTONIX) 40 MG tablet Take 40 mg by mouth daily.     Semaglutide, 1 MG/DOSE, (OZEMPIC, 1 MG/DOSE,) 2 MG/1.5ML SOPN Inject 0.25 mg into the skin every Saturday.     Zinc 50 MG TABS Take 50 mg by  mouth daily.     No current facility-administered medications for this visit.    REVIEW OF SYSTEMS:  [X]  denotes positive finding, [ ]  denotes negative finding Cardiac  Comments:  Chest pain or chest pressure:    Shortness of breath upon exertion:    Short of breath when lying flat:    Irregular heart rhythm:        Vascular    Pain in calf, thigh, or hip brought on by ambulation:    Pain in feet at night that wakes you up from your sleep:     Blood clot in your veins:    Leg swelling:         Pulmonary    Oxygen at home:    Productive cough:     Wheezing:         Neurologic    Sudden weakness in arms or legs:     Sudden numbness in arms or legs:     Sudden onset of difficulty speaking or slurred speech:    Temporary loss of vision in one eye:     Problems with dizziness:         Gastrointestinal    Blood in stool:     Vomited  blood:         Genitourinary    Burning when urinating:     Blood in urine:        Psychiatric    Major depression:         Hematologic    Bleeding problems:    Problems with blood clotting too easily:        Skin    Rashes or ulcers:        Constitutional    Fever or chills:      PHYSICAL EXAM: Vitals:   11/13/22 1242  BP: 120/70  Pulse: 69  Resp: 14  Temp: 97.6 F (36.4 C)  TempSrc: Temporal  SpO2: 99%  Weight: 200 lb (90.7 kg)  Height: 5\' 4"  (1.626 m)    GENERAL: The patient is a well-nourished female, in no acute distress. The vital signs are documented above. CARDIAC: There is a regular rate and rhythm.  VASCULAR:  Bilateral femoral pulses palpable Left TMA healed  Right TMA as pictured with some dehiscense  Brisk right PT and DP at the ankle             DATA:   ABIs 10/09/22 are 1.02 Right and 1.02 left   Lower extremity arterial duplex 10/09/22 shows a 50 to 74% stenosis in the right external iliac with a velocity of 222.  The right SFA stents are patent.  Left SFA stents are patent without stenosis.  Assessment/Plan:  57 year old female presents for 1 month interval follow-up after recent bilateral lower extremity multilevel complex revascularization for critical limb ischemia with tissue loss.  She has since undergone bilateral TMA's with Dr. Lajoyce Corners on 08/08/2022.  The left TMA is healed.  The right TMA has some dehiscence and is requiring wound care.  Going to get revision of this on Friday in the OR with Dr. Lajoyce Corners.  She still has a brisk dorsalis pedis and posterior tibial signal at the right ankle.  I discussed I will see her in 1 month to make sure this is healing after revision and if not we will take her back for repeat angiogram.  Cephus Shelling, MD [facility]: [phone]

## 2022-11-15 ENCOUNTER — Encounter (HOSPITAL_COMMUNITY): Payer: Self-pay | Admitting: Orthopedic Surgery

## 2022-11-15 ENCOUNTER — Other Ambulatory Visit: Payer: Self-pay

## 2022-11-15 ENCOUNTER — Encounter (HOSPITAL_COMMUNITY): Payer: Self-pay | Admitting: Vascular Surgery

## 2022-11-15 NOTE — Progress Notes (Signed)
Anesthesia Chart Review:  Case: [id number] Date/Time: 11/16/22 0715   Procedure: REVISION AMPUTATION RIGHT FOOT (Right)   Anesthesia type: Choice   Pre-op diagnosis: Dehiscence Right Transmetatarsal Amputation   [facility] / [facility]   Surgeons: Nadara Mustard, MD       DISCUSSION: Patient is a 57 year old female scheduled for the above procedure. See below for summary of April 2024 admission for NSTEMI, acute systolic CHF with consideration of CABG/MR surgery in the future after foot wounds healed. Continued medical at 11/06/22 cardiology follow-up. She is continuing DAPT.  History includes former smoker, postoperative N/V, HTN, hypercholesterolemia, CAD (NSTEMI 08/18/22), chronic systolic CHF (08/19/22 EF 40-45%, 50-55% 10/22/22), mitral regurgitation (moderate 10/22/22), aortic stenosis (moderate 10/22/22), PAD (right SFA atherectomy, right SFA angioplasty/stent, angioplasty right popliteal artery 01/14/20; skin grafting to right foot ulcer 03/18/20; left SFA & popliteal angioplasty/stents 07/05/22; ray amputation right 1st-2nd toes, left 1st toe amputation 07/20/22; left SFA & popliteal thrombectomy & angioplasty of stents 08/02/22; laser atherectomy and drug-coated angioplasty right SFA & popliteal arteries 08/06/22; bilateral TMA 08/08/22), DM2, hypothyroidism, GERD, anemia. + Marijuana.  [place] admission 08/18/22 - 08/27/22 for NSTEMI and acute systolic CHF. Had been previously admitted 08/02/22 - 08/14/22 for BLE critical limb ischemia with non-healing foot wounds, s/p percutaneous interventions to BLE, but ultimately required bilateral transmetatarsal amputations on 08/08/22. She was discharged home on ASA and Plavix. She had a fair amount of bleeding initially from her surgical incision sites, but did not come back to the hospital until she noted increasing dyspnea and orhtopnea. HGB 6.1 on admission (down from 8.2 on 08/07/22), s/p 1 unit PRBC. HS Troponin 1096->1323->1227. BNP 486. CXR with  interstitial changes and small pleural effusions. CTA showed pulmonary edema, no PE, small-moderate pleural effusions, and probable compressive basilar atelectasis versus consolidation. She was given IV Lasix. Cardiology consulted. 08/19/22 TTE showed LVEF 40-45%, anterolateral and apical lateral hypokinesis, severe MR, mild AS. 08/23/22 cardiac cath showed severe 3V CAD (40% mid-distal LM, 70% mid LAD, 75% ost-prox LAD, 25% D1, 100% ost-prox LCx, 70% mid RCA, no AS, mild pulmonary hypertension CT surgery consulted and evaluated by Dr. Leafy Ro on 08/23/22. TEE planned and consider CABG/MV surgery in the the future with timing dependent on recovery from amputations and acute CHF/NSTEMI. 08/27/22 TEE showed LVEF 40-45%, moderate MR, moderate AS. Decision made to hold off on cardiac surgery until metatarsal wounds healed.  Last CT surgery follow-up with Dr. Leafy Ro was on 10/22/22. He wrote, "Pt with NYHA class 0 symptoms of CAD and moderate AS with depressed LV function. Pt continues to be adequetly managed medically. She is seeing cardiology next month. Will continue to have medical management of her CAD/AS until Cardiology feels indications exist to proceed with surgery. Pt is agreeable with plan".  Last cardiologist follow-up with Dr. Jenene Slicker was on 11/06/22. He wrote, "Patient had perioperative NSTEMI in 4/24 and underwent LHC that showed severe calcific multivessel CAD and severe MR by TTE. TEE showed moderate MR and moderate AS. Patient has been asymptomatic since 4/24 and CABG has been deferred.  She has no interval angina or DOE.  Continue cardio prudent medications with DAPT, aspirin 81 mg once daily and Plavix 75 mg once daily.  Continue atorvastatin 40 mg nightly. -SL NG 0.4 mg as needed". Six month follow-up planned.  Last orthopedic visit with Dr. Lajoyce Corners was on 6/245/25. Right TMA site with "a large ulcer that was debrided with a 10 blade knife after debridement the wound is  8 x 3 cm. The wound extends  down to bone." Revision of the right TMA site recommended. Patient able to proceed on 7//19/24.   Last vascular evaluation by Dr. Chestine Spore was on 11/13/22. She had a brisk DP and PT signal at the right ankle.  The left TMA site was healed. The right TMA site was slow to heal and noted plans for surgery by Dr. Lajoyce Corners. One month follow-up planned.  Anesthesia team to evaluate on the day of surgery.  Reported instructions to continue aspirin and Plavix.  Last Ozempic dose reported as 11/03/2022.   VS:  BP Readings from Last 3 Encounters:  11/13/22 120/70  11/06/22 126/70  10/22/22 (!) 150/76   Pulse Readings from Last 3 Encounters:  11/13/22 69  11/06/22 77  10/22/22 78    PROVIDERS: Medicine, [facility] is PCP  Greg Cutter, MD is cardiologist Eugenio Hoes, MD is CT surgeon Sherald Hess, MD is vascular surgeon   LABS: For day of surgery. Last results in [facility] include: Lab Results  Component Value Date   WBC 4.9 08/25/2022   HGB 7.8 (L) 08/25/2022   HCT 25.9 (L) 08/25/2022   PLT 327 08/25/2022   GLUCOSE 132 (H) 08/25/2022   CHOL 92 08/03/2022   TRIG 127 08/03/2022   HDL 13 (L) 08/03/2022   LDLCALC 54 08/03/2022   ALT 14 08/06/2022   AST 17 08/06/2022   NA 136 08/25/2022   K 3.9 08/25/2022   CL 96 (L) 08/25/2022   CREATININE 1.28 (H) 08/25/2022   BUN 22 (H) 08/25/2022   CO2 28 08/25/2022   INR 1.2 08/18/2022   HGBA1C 6.4 (H) 08/02/2022     IMAGES: CTA Chest 08/18/22: IMPRESSION: 1. No evidence of pulmonary embolism. 2. Findings suggestive of pulmonary edema with small-moderate layering bilateral pleural effusions. 3. Probable compressive atelectasis versus consolidation within the dependent aspects of the lung bases. 4. Mildly enlarged mediastinal and bilateral hilar lymph nodes, likely reactive. 5. Trace pericardial effusion. 6. Aortic and coronary artery atherosclerosis (ICD10-I70.0).    EKG: 08/20/22: Normal sinus rhythm Minimal voltage criteria  for LVH, may be normal variant ( Cornell product ) Nonspecific ST and T wave abnormality Abnormal ECG When compared with ECG of 19-Aug-2022 05:55, PREVIOUS ECG IS PRESENT Confirmed by Marca Ancona [phone]) on 08/20/2022 10:26:42 PM   CV: TTE 10/22/22: IMPRESSIONS   1. Left ventricular ejection fraction, by estimation, is 50 to 55%. The  left ventricle has low normal function. The left ventricle has no regional  wall motion abnormalities. There is mild left ventricular hypertrophy.  Left ventricular diastolic  parameters are consistent with Grade II diastolic dysfunction  (pseudonormalization). Elevated left atrial pressure.   2. Right ventricular systolic function is normal. The right ventricular  size is normal. Tricuspid regurgitation signal is inadequate for assessing  PA pressure.   3. The mitral valve is abnormal. Restricted posterior leaflet. Moderate  mitral valve regurgitation. Eccentric MR jet, may be underestimating MR  given eccentric jet but normal LA size and E=A wave argues against severe  MR. Recent TEE showed moderate MR   4. The aortic valve is calcified. There is moderate calcification of the  aortic valve. Aortic valve regurgitation is mild. Moderate aortic valve  stenosis. Vmax 2.9 m/s, MG 21 mmHg, AVA 1.2 cm^2, DI 0.30   5. The inferior vena cava is normal in size with greater than 50%  respiratory variability, suggesting right atrial pressure of 3 mmHg.    LE Arterial Duplex/ABI's 10/09/22: -  Lower extremity arterial duplex 10/09/22 shows a 50 to 74% stenosis in the right external iliac with a velocity of 222.  The right SFA stents are patent.  Left SFA stents are patent without stenosis. -  ABIs 10/09/22 are 1.02 Right and 1.02 left      TEE 08/27/22: IMPRESSIONS   1. Left ventricular ejection fraction, by estimation, is 40 to 45%. The  left ventricle has mildly decreased function.   2. Right ventricular systolic function is normal. The right ventricular   size is normal.   3. The mitral valve is degenerative. There is tethering of the posterior  mitral valve leaflet with moderate mitral valve regurgitation (SBP 110s  during study). Pulmonary vein flow is normal.   4. The aortic valve is tricuspid. There is moderate calcification of the  aortic valve. There is moderate thickening of the aortic valve. Moderate  aortic valve stenosis. Aortic valve area, by VTI measures 1.08 cm. AVA by  planimetry is 1.2cm2. Aortic  valve mean gradient measures 24.0 mmHg. Aortic valve Vmax measures 3.13  m/s. DI 0.32.   5. No left atrial/left atrial appendage thrombus was detected.   6. A small pericardial effusion is present. The pericardial effusion is  circumferential. There is no evidence of cardiac tamponade.   7. Agitated saline contrast bubble study was negative, with no evidence  of any interatrial shunt.   8. There is Moderate (Grade III) plaque.  - Comparison TTE 08/19/22:  LVEF 40-45%, anterolateral and apical lateral hypokinesis, severe MR, mild AS   RHC/LHC 08/23/22:   Ost LAD to Prox LAD lesion is 75% stenosed.   Ost Cx to Prox Cx lesion is 100% stenosed.   1st Diag lesion is 25% stenosed.   Mid RCA lesion is 70% stenosed.   Mid LM to Dist LM lesion is 40% stenosed.   Mid LAD lesion is 70% stenosed.   LV end diastolic pressure is moderately elevated.   Hemodynamic findings consistent with mild pulmonary hypertension.   There is no aortic valve stenosis.   Aortic saturation 91%, PA saturation 60%, mean RA pressure 9 mmHg, PA pressure 42/24, mean PA pressure 32 mmHg, mean pulmonary capillary wedge pressure 24 mmHg, V waves noted to 45 mmHg.  Cardiac output 8.58 L/min, cardiac index 4.26.   Severe, calcific three-vessel coronary artery disease.  Severe mitral regurgitation by noninvasive workup.  Significant V waves noted on the wedge tracing.  Will obtain cardiac surgery consult for CABG and mitral valve repair.   Past Medical History:   Diagnosis Date   Anemia    Aortic stenosis    Arthritis    CHF (congestive heart failure) (HCC)    Coronary artery disease    Diabetes mellitus without complication (HCC)    type 2   GERD (gastroesophageal reflux disease)    Heart murmur    History of blood transfusion    Hypercholesteremia    Hypertension    Hypothyroidism    Mitral regurgitation    Myocardial infarction (HCC)    Peripheral vascular disease (HCC)    PONV (postoperative nausea and vomiting)    Thyroid disease     Past Surgical History:  Procedure Laterality Date   ABDOMINAL AORTOGRAM W/LOWER EXTREMITY Bilateral 01/14/2020   Procedure: ABDOMINAL AORTOGRAM W/LOWER EXTREMITY;  Surgeon: Cephus Shelling, MD;  [facility];  Service: Cardiovascular;  Laterality: Bilateral;   ABDOMINAL AORTOGRAM W/LOWER EXTREMITY N/A 07/05/2022   Procedure: ABDOMINAL AORTOGRAM W/LOWER EXTREMITY;  Surgeon: Sherald Hess  J, MD;  [facility];  Service: Cardiovascular;  Laterality: N/A;   ABDOMINAL AORTOGRAM W/LOWER EXTREMITY N/A 08/02/2022   Procedure: ABDOMINAL AORTOGRAM W/LOWER EXTREMITY;  Surgeon: Cephus Shelling, MD;  [facility];  Service: Cardiovascular;  Laterality: N/A;   ABDOMINAL AORTOGRAM W/LOWER EXTREMITY N/A 08/06/2022   Procedure: ABDOMINAL AORTOGRAM W/LOWER EXTREMITY;  Surgeon: Cephus Shelling, MD;  [facility];  Service: Cardiovascular;  Laterality: N/A;   AMPUTATION Bilateral 07/20/2022   Procedure: AMPUTATION RIGHT GREAT TOE AND SECOND TOE, AMPUTATION LEFT GREAT TOE;  Surgeon: Nadara Mustard, MD;  Location[facility];  Service: Orthopedics;  Laterality: Bilateral;   AMPUTATION Bilateral 08/08/2022   Procedure: BILATERAL TRANSMETATARSAL AMPUTATION;  Surgeon: Nadara Mustard, MD;  Location: [facility] OR;  Service: Orthopedics;  Laterality: Bilateral;   AMPUTATION TOE     PERIPHERAL VASCULAR ATHERECTOMY Right 01/14/2020   Procedure: PERIPHERAL VASCULAR  ATHERECTOMY;  Surgeon: Cephus Shelling, MD;  Location: [facility] INVASIVE CV LAB;  Service: Cardiovascular;  Laterality: Right;  sfa   PERIPHERAL VASCULAR INTERVENTION Right 01/14/2020   Procedure: PERIPHERAL VASCULAR INTERVENTION;  Surgeon: Cephus Shelling, MD;  [facility];  Service: Cardiovascular;  Laterality: Right;  sfa stent    PERIPHERAL VASCULAR INTERVENTION  07/05/2022   Procedure: PERIPHERAL VASCULAR INTERVENTION;  Surgeon: Cephus Shelling, MD;  [facility];  Service: Cardiovascular;;   PERIPHERAL VASCULAR INTERVENTION  08/02/2022   Procedure: PERIPHERAL VASCULAR INTERVENTION;  Surgeon: Cephus Shelling, MD;  [facility];  Service: Cardiovascular;;   PERIPHERAL VASCULAR INTERVENTION  08/06/2022   Procedure: PERIPHERAL VASCULAR INTERVENTION;  Surgeon: Cephus Shelling, MD;  Location: [facility] INVASIVE CV LAB;  Service: Cardiovascular;;   PERIPHERAL VASCULAR THROMBECTOMY  08/02/2022   Procedure: PERIPHERAL VASCULAR THROMBECTOMY;  Surgeon: Cephus Shelling, MD;  [facility];  Service: Cardiovascular;;   RIGHT/LEFT HEART CATH AND CORONARY ANGIOGRAPHY N/A 08/23/2022   Procedure: RIGHT/LEFT HEART CATH AND CORONARY ANGIOGRAPHY;  Surgeon: [name], MD;  Location: [facility] INVASIVE CV LAB;  Service: Cardiovascular;  Laterality: N/A;   SKIN SPLIT GRAFT Right 03/18/2020   Procedure: SKIN GRAFTING RIGHT FOOT ULCER;  Surgeon: Nadara Mustard, MD;  Location: [facility] OR;  Service: Orthopedics;  Laterality: Right;   TEE WITHOUT CARDIOVERSION N/A 08/27/2022   Procedure: TRANSESOPHAGEAL ECHOCARDIOGRAM;  Surgeon: [name], MD;  Location: [facility] INVASIVE CV LAB;  Service: Cardiovascular;  Laterality: N/A;   WISDOM TOOTH EXTRACTION      MEDICATIONS: No current facility-administered medications for this encounter.    acetaminophen (TYLENOL) 500 MG tablet   amLODipine (NORVASC) 10 MG tablet   aspirin 81 MG EC tablet    atorvastatin (LIPITOR) 40 MG tablet   clopidogrel (PLAVIX) 75 MG tablet   furosemide (LASIX) 40 MG tablet   gabapentin (NEURONTIN) 300 MG capsule   insulin degludec (TRESIBA FLEXTOUCH) 100 UNIT/ML FlexTouch Pen   levothyroxine (SYNTHROID) 150 MCG tablet   lisinopril (ZESTRIL) 2.5 MG tablet   metoprolol succinate (TOPROL-XL) 25 MG 24 hr tablet   nitroGLYCERIN (NITROSTAT) 0.4 MG SL tablet   pantoprazole (PROTONIX) 40 MG tablet   Semaglutide, 1 MG/DOSE, (OZEMPIC, 1 MG/DOSE,) 2 MG/1.5ML SOPN   Zinc 50 MG TABS   diphenhydrAMINE HCl, Sleep, (ZZZQUIL) 25 MG CAPS     Shonna Chock, PA-C Surgical Short Stay/Anesthesiology [facility] Phone [phone] [facility] Phone [phone] 11/15/2022 10:43 AM

## 2022-11-15 NOTE — Progress Notes (Addendum)
Kayla Kayla Schmitt denies chest pain or shortness of breath. Patient denies having any s/s of Covid in her household, also denies any known exposure to Covid. Kayla. Kayla Schmitt denies  any s/s of upper or lower respiratory infections in the past 8 weeks.   Kayla Schmitt's PCP is with [facility], cardiologist is Dr. Luane School.  Patient was seen by cardiologist on 11/06/22, notes instruct Kayla Schmitt to continue Plavix and ASA.  Kayla Schmitt has type II diabetes, patient reports that CBGs run 90's- patient states she checks it 2 times a week. Kayla Schmitt reports that she took Ozempic on 11/03/22, I instructed patient to take 1/2 dose of Tresiba tonight.  Kayla Schmitt's hemoglobin was 7.8 at discharge 08/23/22, it had been low during the admission.  I called Dr. Audrie Schmitt office and left a message for Kayla Schmitt informing her the hemoglobin and asking for orders from Dr. Lajoyce Schmitt who was sent a staff message on 11/14/22- no orders at this time.

## 2022-11-15 NOTE — Anesthesia Preprocedure Evaluation (Signed)
Anesthesia Evaluation    Airway        Dental   Pulmonary former smoker          Cardiovascular hypertension,   TTE 10/22/22: IMPRESSIONS  1. Left ventricular ejection fraction, by estimation, is 50 to 55%. The  left ventricle has low normal function. The left ventricle has no regional  wall motion abnormalities. There is mild left ventricular hypertrophy.  Left ventricular diastolic  parameters are consistent with Grade II diastolic dysfunction  (pseudonormalization). Elevated left atrial pressure.  2. Right ventricular systolic function is normal. The right ventricular  size is normal. Tricuspid regurgitation signal is inadequate for assessing  PA pressure.  3. The mitral valve is abnormal. Restricted posterior leaflet. Moderate  mitral valve regurgitation. Eccentric MR jet, may be underestimating MR  given eccentric jet but normal LA size and E=A wave argues against severe  MR. Recent TEE showed moderate MR  4. The aortic valve is calcified. There is moderate calcification of the  aortic valve. Aortic valve regurgitation is mild. Moderate aortic valve  stenosis. Vmax 2.9 m/s, MG 21 mmHg, AVA 1.2 cm^2, DI 0.30  5. The inferior vena cava is normal in size with greater than 50%  respiratory variability, suggesting right atrial pressure of 3 mmHg.    RHC/LHC 08/23/22:   Ost LAD to Prox LAD lesion is 75% stenosed.   Ost Cx to Prox Cx lesion is 100% stenosed.   1st Diag lesion is 25% stenosed.   Mid RCA lesion is 70% stenosed.   Mid LM to Dist LM lesion is 40% stenosed.   Mid LAD lesion is 70% stenosed.   LV end diastolic pressure is moderately elevated.   Hemodynamic findings consistent with mild pulmonary hypertension.   There is no aortic valve stenosis.   Aortic saturation 91%, PA saturation 60%, mean RA pressure 9 mmHg, PA pressure 42/24, mean PA pressure 32 mmHg, mean pulmonary capillary wedge pressure 24 mmHg,  V waves noted to 45 mmHg.  Cardiac output 8.58 L/min, cardiac index 4.26.  Severe, calcific three-vessel coronary artery disease.  Severe mitral regurgitation by noninvasive workup.  Significant V waves noted on the wedge tracing.  Will obtain cardiac surgery consult for CABG and mitral valve repair.    Neuro/Psych    GI/Hepatic   Endo/Other  diabetes    Renal/GU      Musculoskeletal   Abdominal   Peds  Hematology   Anesthesia Other Findings   Reproductive/Obstetrics                             Anesthesia Physical Anesthesia Plan  ASA:   Anesthesia Plan:    Post-op Pain Management:    Induction:   PONV Risk Score and Plan:   Airway Management Planned:   Additional Equipment:   Intra-op Plan:   Post-operative Plan:   Informed Consent:   Plan Discussed with:   Anesthesia Plan Comments: (See PAT note written 11/15/2022 by Shonna Chock, PA-C.  )       Anesthesia Quick Evaluation

## 2022-11-16 ENCOUNTER — Ambulatory Visit (HOSPITAL_COMMUNITY)
Admission: RE | Admit: 2022-11-16 | Discharge: 2022-11-16 | Disposition: A | Discharge status: Home / Self Care | Payer: Managed Care, Other (non HMO) | Source: Ambulatory Visit | Attending: Orthopedic Surgery | Admitting: Orthopedic Surgery

## 2022-11-16 ENCOUNTER — Encounter (HOSPITAL_COMMUNITY)
Admission: RE | Disposition: A | Discharge status: Home / Self Care | Payer: Self-pay | Source: Ambulatory Visit | Attending: Orthopedic Surgery

## 2022-11-16 DIAGNOSIS — Y838 Other surgical procedures as the cause of abnormal reaction of the patient, or of later complication, without mention of misadventure at the time of the procedure: Secondary | ICD-10-CM | POA: Insufficient documentation

## 2022-11-16 DIAGNOSIS — M86171 Other acute osteomyelitis, right ankle and foot: Secondary | ICD-10-CM

## 2022-11-16 DIAGNOSIS — Z538 Procedure and treatment not carried out for other reasons: Secondary | ICD-10-CM | POA: Diagnosis not present

## 2022-11-16 DIAGNOSIS — T8130XA Disruption of wound, unspecified, initial encounter: Secondary | ICD-10-CM | POA: Insufficient documentation

## 2022-11-16 HISTORY — DX: Heart failure, unspecified: I50.9

## 2022-11-16 HISTORY — DX: Nonrheumatic mitral (valve) insufficiency: I34.0

## 2022-11-16 HISTORY — DX: Peripheral vascular disease, unspecified: I73.9

## 2022-11-16 HISTORY — DX: Personal history of other medical treatment: Z92.89

## 2022-11-16 HISTORY — DX: Atherosclerotic heart disease of native coronary artery without angina pectoris: I25.10

## 2022-11-16 HISTORY — DX: Acute myocardial infarction, unspecified: I21.9

## 2022-11-16 HISTORY — DX: Anemia, unspecified: D64.9

## 2022-11-16 HISTORY — PX: STUMP REVISION: SHX6102

## 2022-11-16 HISTORY — DX: Nonrheumatic aortic (valve) stenosis: I35.0

## 2022-11-16 HISTORY — DX: Cardiac murmur, unspecified: R01.1

## 2022-11-16 SURGERY — REVISION, AMPUTATION SITE
Anesthesia: Choice

## 2022-11-16 SURGICAL SUPPLY — 31 items
BAG COUNTER SPONGE SURGICOUNT (BAG) ×2 IMPLANT
BAG SPNG CNTER NS LX DISP (BAG) ×1
BLADE SAW RECIP 87.9 MT (BLADE) IMPLANT
BLADE SURG 21 STRL SS (BLADE) ×2 IMPLANT
CANISTER WOUND CARE 500ML ATS (WOUND CARE) ×2 IMPLANT
COVER SURGICAL LIGHT HANDLE (MISCELLANEOUS) ×2 IMPLANT
DRAPE EXTREMITY T 121X128X90 (DISPOSABLE) ×2 IMPLANT
DRAPE HALF SHEET 40X57 (DRAPES) ×2 IMPLANT
DRAPE INCISE IOBAN 66X45 STRL (DRAPES) ×2 IMPLANT
DRAPE U-SHAPE 47X51 STRL (DRAPES) ×4 IMPLANT
DRESSING PREVENA PLUS CUSTOM (GAUZE/BANDAGES/DRESSINGS) ×2 IMPLANT
DRSG PREVENA PLUS CUSTOM (GAUZE/BANDAGES/DRESSINGS) ×1
DURAPREP 26ML APPLICATOR (WOUND CARE) ×2 IMPLANT
ELECT REM PT RETURN 9FT ADLT (ELECTROSURGICAL) ×1
ELECTRODE REM PT RTRN 9FT ADLT (ELECTROSURGICAL) ×2 IMPLANT
GLOVE BIOGEL PI IND STRL 9 (GLOVE) ×2 IMPLANT
GLOVE SURG ORTHO 9.0 STRL STRW (GLOVE) ×2 IMPLANT
GOWN STRL REUS W/ TWL XL LVL3 (GOWN DISPOSABLE) ×4 IMPLANT
GOWN STRL REUS W/TWL XL LVL3 (GOWN DISPOSABLE) ×2
KIT BASIN OR (CUSTOM PROCEDURE TRAY) ×2 IMPLANT
KIT TURNOVER KIT B (KITS) ×2 IMPLANT
MANIFOLD NEPTUNE II (INSTRUMENTS) ×2 IMPLANT
NS IRRIG 1000ML POUR BTL (IV SOLUTION) ×2 IMPLANT
PACK GENERAL/GYN (CUSTOM PROCEDURE TRAY) ×2 IMPLANT
PAD ARMBOARD 7.5X6 YLW CONV (MISCELLANEOUS) ×2 IMPLANT
PREVENA RESTOR ARTHOFORM 46X30 (CANNISTER) ×2 IMPLANT
STAPLER VISISTAT 35W (STAPLE) IMPLANT
SUT ETHILON 2 0 PSLX (SUTURE) ×4 IMPLANT
SUT SILK 2 0 (SUTURE)
SUT SILK 2-0 18XBRD TIE 12 (SUTURE) IMPLANT
TOWEL GREEN STERILE (TOWEL DISPOSABLE) ×2 IMPLANT

## 2022-11-16 NOTE — Progress Notes (Signed)
Patient made aware surgery was being cancelled due to [employer] outage.  Patient instructed to reach out to the surgeon's office to reschedule.

## 2022-11-19 ENCOUNTER — Encounter (HOSPITAL_COMMUNITY): Payer: Self-pay | Admitting: Orthopedic Surgery

## 2022-11-19 NOTE — Progress Notes (Signed)
Reached out to the patient to go over her instructions for her procedure on 7/24. Pt says she is unable to have surgery on Wednesday. She reached out to the surgeon's office this morning but hadn't heard anything by the time I had called. I told her I would reach out to Dr. Audrie Lia office as well. Called and left a message on Cheryl Bennett's vm asking her to reach out to the patient.

## 2022-11-20 NOTE — Progress Notes (Signed)
Spoke to patient re: surgery. States she spoke with Consuello Bossier at Dr. Audrie Lia office and surgery will be reschedule in August. OR scheduler, Selena Batten made aware to remove patient from the schedule.

## 2022-11-21 ENCOUNTER — Ambulatory Visit (HOSPITAL_COMMUNITY)
Admission: RE | Admit: 2022-11-21 | Payer: Managed Care, Other (non HMO) | Source: Ambulatory Visit | Admitting: Orthopedic Surgery

## 2022-11-21 SURGERY — REVISION, AMPUTATION SITE
Anesthesia: Choice | Laterality: Right

## 2022-11-22 ENCOUNTER — Ambulatory Visit (INDEPENDENT_AMBULATORY_CARE_PROVIDER_SITE_OTHER): Payer: Managed Care, Other (non HMO) | Admitting: Orthopedic Surgery

## 2022-11-22 DIAGNOSIS — Z89439 Acquired absence of unspecified foot: Secondary | ICD-10-CM

## 2022-11-29 NOTE — Progress Notes (Signed)
Kayla Schmitt denies chest pain or shortness of breath. Patient denies having any s/s of Covid in her household, also denies any known exposure to Covid.  Kayla Schmitt denies  any s/s of upper or lower respiratory in the past 8 weeks.   Kayla Schmitt PCP is with [facility], cardiologist is Dr. Lenord Carbo.  Kayla Schmitt has type II diabetes. Patient reports that CBGs have been running in the 120 range.  I instructed patient to take 5 units of Tresiba tonight. I instructed patient to check CBG after awaking and every 2 hours until arrival  to the hospital. I Instructed patient if CBG is less than 70 to take 4 Glucose Tablets or 1 tube of Glucose Gel or 1/2 cup of a clear juice. Recheck CBG in 15 minutes if CBG is not over 70 call, pre- op desk at [phone] for further instructions.  Kayla Schmitt took last dose of Ozempic 11/18/22.

## 2022-11-30 ENCOUNTER — Ambulatory Visit (HOSPITAL_COMMUNITY): Payer: Managed Care, Other (non HMO) | Admitting: Anesthesiology

## 2022-11-30 ENCOUNTER — Other Ambulatory Visit: Payer: Self-pay

## 2022-11-30 ENCOUNTER — Encounter (HOSPITAL_COMMUNITY)
Admission: RE | Disposition: A | Discharge status: Home / Self Care | Payer: Self-pay | Source: Ambulatory Visit | Attending: Orthopedic Surgery

## 2022-11-30 ENCOUNTER — Encounter (HOSPITAL_COMMUNITY): Payer: Self-pay | Admitting: Orthopedic Surgery

## 2022-11-30 ENCOUNTER — Ambulatory Visit (HOSPITAL_COMMUNITY)
Admission: RE | Admit: 2022-11-30 | Discharge: 2022-11-30 | Disposition: A | Discharge status: Home / Self Care | Payer: Managed Care, Other (non HMO) | Source: Ambulatory Visit | Attending: Orthopedic Surgery | Admitting: Orthopedic Surgery

## 2022-11-30 DIAGNOSIS — Z7902 Long term (current) use of antithrombotics/antiplatelets: Secondary | ICD-10-CM | POA: Insufficient documentation

## 2022-11-30 DIAGNOSIS — Z794 Long term (current) use of insulin: Secondary | ICD-10-CM | POA: Insufficient documentation

## 2022-11-30 DIAGNOSIS — L97511 Non-pressure chronic ulcer of other part of right foot limited to breakdown of skin: Secondary | ICD-10-CM | POA: Diagnosis not present

## 2022-11-30 DIAGNOSIS — E1151 Type 2 diabetes mellitus with diabetic peripheral angiopathy without gangrene: Secondary | ICD-10-CM | POA: Insufficient documentation

## 2022-11-30 DIAGNOSIS — K219 Gastro-esophageal reflux disease without esophagitis: Secondary | ICD-10-CM | POA: Diagnosis not present

## 2022-11-30 DIAGNOSIS — I739 Peripheral vascular disease, unspecified: Secondary | ICD-10-CM | POA: Diagnosis not present

## 2022-11-30 DIAGNOSIS — Y835 Amputation of limb(s) as the cause of abnormal reaction of the patient, or of later complication, without mention of misadventure at the time of the procedure: Secondary | ICD-10-CM | POA: Insufficient documentation

## 2022-11-30 DIAGNOSIS — Z87891 Personal history of nicotine dependence: Secondary | ICD-10-CM | POA: Insufficient documentation

## 2022-11-30 DIAGNOSIS — I110 Hypertensive heart disease with heart failure: Secondary | ICD-10-CM | POA: Insufficient documentation

## 2022-11-30 DIAGNOSIS — I509 Heart failure, unspecified: Secondary | ICD-10-CM | POA: Insufficient documentation

## 2022-11-30 DIAGNOSIS — Z7985 Long-term (current) use of injectable non-insulin antidiabetic drugs: Secondary | ICD-10-CM | POA: Diagnosis not present

## 2022-11-30 DIAGNOSIS — T8781 Dehiscence of amputation stump: Secondary | ICD-10-CM | POA: Insufficient documentation

## 2022-11-30 DIAGNOSIS — I080 Rheumatic disorders of both mitral and aortic valves: Secondary | ICD-10-CM | POA: Insufficient documentation

## 2022-11-30 HISTORY — PX: STUMP REVISION: SHX6102

## 2022-11-30 LAB — BASIC METABOLIC PANEL
Anion gap: 11 (ref 5–15)
BUN: 28 mg/dL — ABNORMAL HIGH (ref 6–20)
CO2: 26 mmol/L (ref 22–32)
Calcium: 9 mg/dL (ref 8.9–10.3)
Chloride: 101 mmol/L (ref 98–111)
Creatinine, Ser: 1.33 mg/dL — ABNORMAL HIGH (ref 0.44–1.00)
GFR, Estimated: 47 mL/min — ABNORMAL LOW (ref >60)
Glucose, Bld: 138 mg/dL — ABNORMAL HIGH (ref 70–99)
Potassium: 3.5 mmol/L (ref 3.5–5.1)
Sodium: 138 mmol/L (ref 135–145)

## 2022-11-30 LAB — CBC
HCT: 27.1 % — ABNORMAL LOW (ref 36.0–46.0)
Hemoglobin: 8.9 g/dL — ABNORMAL LOW (ref 12.0–15.0)
MCH: 29.4 pg (ref 26.0–34.0)
MCHC: 32.8 g/dL (ref 30.0–36.0)
MCV: 89.4 fL (ref 80.0–100.0)
Platelets: 208 10*3/uL (ref 150–400)
RBC: 3.03 MIL/uL — ABNORMAL LOW (ref 3.87–5.11)
RDW: 15 % (ref 11.5–15.5)
WBC: 7.7 10*3/uL (ref 4.0–10.5)
nRBC: 0 % (ref 0.0–0.2)

## 2022-11-30 LAB — GLUCOSE, CAPILLARY
Glucose-Capillary: 127 mg/dL — ABNORMAL HIGH (ref 70–99)
Glucose-Capillary: 146 mg/dL — ABNORMAL HIGH (ref 70–99)

## 2022-11-30 SURGERY — REVISION, AMPUTATION SITE
Anesthesia: Monitor Anesthesia Care | Laterality: Right

## 2022-11-30 MED ORDER — PROPOFOL 10 MG/ML IV BOLUS
INTRAVENOUS | Status: AC
  Filled 2022-11-30: qty 20

## 2022-11-30 MED ORDER — OXYCODONE HCL 5 MG PO TABS: 5.0000 mg | ORAL_TABLET | Freq: Once | ORAL | Status: DC | PRN

## 2022-11-30 MED ORDER — PHENYLEPHRINE HCL (PRESSORS) 10 MG/ML IV SOLN
INTRAVENOUS | Status: DC | PRN
  Administered 2022-11-30: 160 ug via INTRAVENOUS

## 2022-11-30 MED ORDER — LIDOCAINE HCL 1 % IJ SOLN
INTRAMUSCULAR | Status: AC
  Filled 2022-11-30: qty 20

## 2022-11-30 MED ORDER — OXYCODONE-ACETAMINOPHEN 5-325 MG PO TABS: 1.0000 | ORAL_TABLET | ORAL | 0 refills | Status: DC | PRN

## 2022-11-30 MED ORDER — VASHE WOUND IRRIGATION OPTIME
TOPICAL | Status: DC | PRN
  Administered 2022-11-30: 34 [oz_av]

## 2022-11-30 MED ORDER — FENTANYL CITRATE (PF) 100 MCG/2ML IJ SOLN: 25.0000 ug | INTRAMUSCULAR | Status: DC | PRN

## 2022-11-30 MED ORDER — 0.9 % SODIUM CHLORIDE (POUR BTL) OPTIME
TOPICAL | Status: DC | PRN
  Administered 2022-11-30: 1000 mL

## 2022-11-30 MED ORDER — ONDANSETRON HCL 4 MG/2ML IJ SOLN
INTRAMUSCULAR | Status: DC | PRN
  Administered 2022-11-30: 4 mg via INTRAVENOUS

## 2022-11-30 MED ORDER — CEFAZOLIN SODIUM-DEXTROSE 2-4 GM/100ML-% IV SOLN
INTRAVENOUS | Status: AC
  Filled 2022-11-30: qty 100

## 2022-11-30 MED ORDER — PROMETHAZINE HCL 25 MG/ML IJ SOLN: 6.2500 mg | INTRAMUSCULAR | Status: DC | PRN

## 2022-11-30 MED ORDER — CEFAZOLIN SODIUM-DEXTROSE 2-4 GM/100ML-% IV SOLN
2.0000 g | INTRAVENOUS | Status: AC
  Administered 2022-11-30: 2 g via INTRAVENOUS

## 2022-11-30 MED ORDER — PROPOFOL 500 MG/50ML IV EMUL
INTRAVENOUS | Status: DC | PRN
  Administered 2022-11-30: 50 ug/kg/min via INTRAVENOUS

## 2022-11-30 MED ORDER — MIDAZOLAM HCL 2 MG/2ML IJ SOLN
INTRAMUSCULAR | Status: DC | PRN
  Administered 2022-11-30: 2 mg via INTRAVENOUS

## 2022-11-30 MED ORDER — LACTATED RINGERS IV SOLN: INTRAVENOUS | Status: DC

## 2022-11-30 MED ORDER — MIDAZOLAM HCL 2 MG/2ML IJ SOLN
INTRAMUSCULAR | Status: AC
  Filled 2022-11-30: qty 2

## 2022-11-30 MED ORDER — AMISULPRIDE (ANTIEMETIC) 5 MG/2ML IV SOLN: 10.0000 mg | Freq: Once | INTRAVENOUS | Status: DC | PRN

## 2022-11-30 MED ORDER — FENTANYL CITRATE (PF) 250 MCG/5ML IJ SOLN
INTRAMUSCULAR | Status: AC
  Filled 2022-11-30: qty 5

## 2022-11-30 MED ORDER — OXYCODONE HCL 5 MG/5ML PO SOLN: 5.0000 mg | Freq: Once | ORAL | Status: DC | PRN

## 2022-11-30 MED ORDER — LIDOCAINE HCL (PF) 1 % IJ SOLN
INTRAMUSCULAR | Status: DC | PRN
Start: 2022-11-30 — End: 2022-11-30
  Administered 2022-11-30: 20 mL

## 2022-11-30 MED ORDER — ORAL CARE MOUTH RINSE: 15.0000 mL | Freq: Once | OROMUCOSAL | Status: AC

## 2022-11-30 MED ORDER — ACETAMINOPHEN 10 MG/ML IV SOLN: 1000.0000 mg | Freq: Once | INTRAVENOUS | Status: DC | PRN

## 2022-11-30 MED ORDER — PROPOFOL 500 MG/50ML IV EMUL: INTRAVENOUS | Status: DC | PRN

## 2022-11-30 MED ORDER — PROPOFOL 10 MG/ML IV BOLUS
INTRAVENOUS | Status: DC | PRN
Start: 2022-11-30 — End: 2022-11-30
  Administered 2022-11-30: 30 mg via INTRAVENOUS

## 2022-11-30 MED ORDER — CHLORHEXIDINE GLUCONATE 0.12 % MT SOLN
15.0000 mL | Freq: Once | OROMUCOSAL | Status: AC
  Administered 2022-11-30: 15 mL via OROMUCOSAL
  Filled 2022-11-30: qty 15

## 2022-11-30 SURGICAL SUPPLY — 32 items
BAG COUNTER SPONGE SURGICOUNT (BAG) ×2 IMPLANT
BAG SPNG CNTER NS LX DISP (BAG) ×1
BLADE SURG 21 STRL SS (BLADE) ×2 IMPLANT
CANISTER WOUND CARE 500ML ATS (WOUND CARE) ×2 IMPLANT
COVER SURGICAL LIGHT HANDLE (MISCELLANEOUS) ×2 IMPLANT
DRAPE DERMATAC (DRAPES) IMPLANT
DRAPE EXTREMITY T 121X128X90 (DISPOSABLE) ×2 IMPLANT
DRAPE HALF SHEET 40X57 (DRAPES) ×2 IMPLANT
DRAPE INCISE IOBAN 66X45 STRL (DRAPES) ×2 IMPLANT
DRAPE U-SHAPE 47X51 STRL (DRAPES) ×4 IMPLANT
DRESSING PEEL AND PLC PRVNA 13 (GAUZE/BANDAGES/DRESSINGS) IMPLANT
DRSG PEEL AND PLACE PREVENA 13 (GAUZE/BANDAGES/DRESSINGS) ×1
DURAPREP 26ML APPLICATOR (WOUND CARE) ×2 IMPLANT
ELECT REM PT RETURN 9FT ADLT (ELECTROSURGICAL) ×1
ELECTRODE REM PT RTRN 9FT ADLT (ELECTROSURGICAL) ×2 IMPLANT
GLOVE BIOGEL PI IND STRL 9 (GLOVE) ×2 IMPLANT
GLOVE SURG ORTHO 9.0 STRL STRW (GLOVE) ×2 IMPLANT
GOWN STRL REUS W/ TWL XL LVL3 (GOWN DISPOSABLE) ×4 IMPLANT
GOWN STRL REUS W/TWL XL LVL3 (GOWN DISPOSABLE) ×2
GRAFT SKIN WND MICRO 38 (Tissue) IMPLANT
KIT BASIN OR (CUSTOM PROCEDURE TRAY) ×2 IMPLANT
KIT DRSG PREVENA PLUS 7DAY 125 (MISCELLANEOUS) IMPLANT
KIT TURNOVER KIT B (KITS) ×2 IMPLANT
MANIFOLD NEPTUNE II (INSTRUMENTS) ×2 IMPLANT
NDL HYPO 21X1.5 SAFETY (NEEDLE) IMPLANT
NEEDLE HYPO 21X1.5 SAFETY (NEEDLE) ×1 IMPLANT
NS IRRIG 1000ML POUR BTL (IV SOLUTION) ×2 IMPLANT
PACK GENERAL/GYN (CUSTOM PROCEDURE TRAY) ×2 IMPLANT
PAD ARMBOARD 7.5X6 YLW CONV (MISCELLANEOUS) ×2 IMPLANT
SUT ETHILON 2 0 PSLX (SUTURE) ×4 IMPLANT
SYR CONTROL 10ML LL (SYRINGE) IMPLANT
TOWEL GREEN STERILE (TOWEL DISPOSABLE) ×2 IMPLANT

## 2022-11-30 NOTE — H&P (Signed)
Kayla Schmitt is an 57 y.o. female.   Chief Complaint: Dehiscence right transmetatarsal amputation. HPI: Patient is approximately 8 months status post right transmetatarsal amputation.  She has a well-healed left transmetatarsal amputation but has had progressive dehiscence on the right.  Past Medical History:  Diagnosis Date   Anemia    Aortic stenosis    Arthritis    CHF (congestive heart failure) (HCC)    Coronary artery disease    Diabetes mellitus without complication (HCC)    type 2   GERD (gastroesophageal reflux disease)    Heart murmur    History of blood transfusion    Hypercholesteremia    Hypertension    Hypothyroidism    Mitral regurgitation    Myocardial infarction (HCC)    Peripheral vascular disease (HCC)    PONV (postoperative nausea and vomiting)    Thyroid disease     Past Surgical History:  Procedure Laterality Date   ABDOMINAL AORTOGRAM W/LOWER EXTREMITY Bilateral 01/14/2020   Procedure: ABDOMINAL AORTOGRAM W/LOWER EXTREMITY;  Surgeon: Cephus Shelling, MD;  [facility];  Service: Cardiovascular;  Laterality: Bilateral;   ABDOMINAL AORTOGRAM W/LOWER EXTREMITY N/A 07/05/2022   Procedure: ABDOMINAL AORTOGRAM W/LOWER EXTREMITY;  Surgeon: Cephus Shelling, MD;  [facility];  Service: Cardiovascular;  Laterality: N/A;   ABDOMINAL AORTOGRAM W/LOWER EXTREMITY N/A 08/02/2022   Procedure: ABDOMINAL AORTOGRAM W/LOWER EXTREMITY;  Surgeon: Cephus Shelling, MD;  [facility];  Service: Cardiovascular;  Laterality: N/A;   ABDOMINAL AORTOGRAM W/LOWER EXTREMITY N/A 08/06/2022   Procedure: ABDOMINAL AORTOGRAM W/LOWER EXTREMITY;  Surgeon: Cephus Shelling, MD;  [facility];  Service: Cardiovascular;  Laterality: N/A;   AMPUTATION Bilateral 07/20/2022   Procedure: AMPUTATION RIGHT GREAT TOE AND SECOND TOE, AMPUTATION LEFT GREAT TOE;  Surgeon: Nadara Mustard, MD;  [facility];  Service:  Orthopedics;  Laterality: Bilateral;   AMPUTATION Bilateral 08/08/2022   Procedure: BILATERAL TRANSMETATARSAL AMPUTATION;  Surgeon: Nadara Mustard, MD;  Location: [facility] OR;  Service: Orthopedics;  Laterality: Bilateral;   AMPUTATION TOE     PERIPHERAL VASCULAR ATHERECTOMY Right 01/14/2020   Procedure: PERIPHERAL VASCULAR ATHERECTOMY;  Surgeon: Cephus Shelling, MD;  Location: [facility] INVASIVE CV LAB;  Service: Cardiovascular;  Laterality: Right;  sfa   PERIPHERAL VASCULAR INTERVENTION Right 01/14/2020   Procedure: PERIPHERAL VASCULAR INTERVENTION;  Surgeon: Cephus Shelling, MD;  [facility];  Service: Cardiovascular;  Laterality: Right;  sfa stent    PERIPHERAL VASCULAR INTERVENTION  07/05/2022   Procedure: PERIPHERAL VASCULAR INTERVENTION;  Surgeon: Cephus Shelling, MD;  [facility];  Service: Cardiovascular;;   PERIPHERAL VASCULAR INTERVENTION  08/02/2022   Procedure: PERIPHERAL VASCULAR INTERVENTION;  Surgeon: Cephus Shelling, MD;  [facility];  Service: Cardiovascular;;   PERIPHERAL VASCULAR INTERVENTION  08/06/2022   Procedure: PERIPHERAL VASCULAR INTERVENTION;  Surgeon: Cephus Shelling, MD;  Location: [facility] INVASIVE CV LAB;  Service: Cardiovascular;;   PERIPHERAL VASCULAR THROMBECTOMY  08/02/2022   Procedure: PERIPHERAL VASCULAR THROMBECTOMY;  Surgeon: Cephus Shelling, MD;  [facility];  Service: Cardiovascular;;   RIGHT/LEFT HEART CATH AND CORONARY ANGIOGRAPHY N/A 08/23/2022   Procedure: RIGHT/LEFT HEART CATH AND CORONARY ANGIOGRAPHY;  Surgeon: [name], MD;  Location: [facility] INVASIVE CV LAB;  Service: Cardiovascular;  Laterality: N/A;   SKIN SPLIT GRAFT Right 03/18/2020   Procedure: SKIN GRAFTING RIGHT FOOT ULCER;  Surgeon: Nadara Mustard, MD;  [facility];  Service: Orthopedics;  Laterality: Right;   STUMP REVISION Right 11/16/2022   Procedure: REVISION AMPUTATION RIGHT FOOT;  Surgeon: Nadara Mustard,  MD;  Location: [facility] OR;  Service: Orthopedics;  Laterality: Right;   TEE WITHOUT CARDIOVERSION N/A 08/27/2022   Procedure: TRANSESOPHAGEAL ECHOCARDIOGRAM;  Surgeon: [name], MD;  Location: [facility] INVASIVE CV LAB;  Service: Cardiovascular;  Laterality: N/A;   WISDOM TOOTH EXTRACTION      Family History  Problem Relation Age of Onset   Diabetes Other    CAD Other    Social History:  reports that she quit smoking about 5 years ago. Her smoking use included cigarettes. She has never used smokeless tobacco. She reports that she does not currently use alcohol. She reports current drug use. Drug: Marijuana.  Allergies:  Allergies  Allergen Reactions   Doxycycline Nausea And Vomiting    Extremely N/V   Trental [Pentoxifylline] Nausea And Vomiting   Penicillins Rash    Tolerated cephalosporins 07/2022     Medications Prior to Admission  Medication Sig Dispense Refill   acetaminophen (TYLENOL) 500 MG tablet Take 500-1,000 mg by mouth every 6 (six) hours as needed for moderate pain.     amLODipine (NORVASC) 10 MG tablet Take 10 mg by mouth daily.     aspirin 81 MG EC tablet Take 81 mg by mouth daily.     atorvastatin (LIPITOR) 40 MG tablet Take 40 mg by mouth daily.     clopidogrel (PLAVIX) 75 MG tablet TAKE 1 TABLET BY MOUTH EVERY DAY WITH BREAKFAST (Patient taking differently: Take 75 mg by mouth daily.) 90 tablet 3   diphenhydrAMINE HCl, Sleep, (ZZZQUIL) 25 MG CAPS Take 25 mg by mouth at bedtime as needed (Sleep).     furosemide (LASIX) 40 MG tablet Take 1 tablet (40 mg total) by mouth 2 (two) times daily. 30 tablet 3   gabapentin (NEURONTIN) 300 MG capsule Take 300 mg by mouth at bedtime.     insulin degludec (TRESIBA FLEXTOUCH) 100 UNIT/ML FlexTouch Pen Inject 10 Units into the skin at bedtime. 3 mL 3   levothyroxine (SYNTHROID) 150 MCG tablet Take 150 mcg by mouth daily.     lisinopril (ZESTRIL) 2.5 MG tablet Take 1 tablet (2.5 mg total) by mouth daily. 90 tablet 2   metoprolol  succinate (TOPROL-XL) 25 MG 24 hr tablet Take 0.5 tablets (12.5 mg total) by mouth daily. 30 tablet 1   nitroGLYCERIN (NITROSTAT) 0.4 MG SL tablet Place 1 tablet (0.4 mg total) under the tongue every 5 (five) minutes x 3 doses as needed for chest pain (if no relief after 3rd dose proceed to ED or call 911). 11/06/2022-New 25 tablet 1   pantoprazole (PROTONIX) 40 MG tablet Take 40 mg by mouth daily.     Zinc 50 MG TABS Take 50 mg by mouth daily.     Semaglutide, 1 MG/DOSE, (OZEMPIC, 1 MG/DOSE,) 2 MG/1.5ML SOPN Inject 0.25 mg into the skin every Saturday.      Results for orders placed or performed during the hospital encounter of 11/30/22 (from the past 48 hour(s))  Basic metabolic panel per protocol     Status: Abnormal   Collection Time: 11/30/22  5:53 AM  Result Value Ref Range   Sodium 138 135 - 145 mmol/L   Potassium 3.5 3.5 - 5.1 mmol/L   Chloride 101 98 - 111 mmol/L   CO2 26 22 - 32 mmol/L   Glucose, Bld 138 (H) 70 - 99  mg/dL    Comment: Glucose reference range applies only to samples taken after fasting for at least 8 hours.   BUN 28 (H) 6 - 20 mg/dL   Creatinine, Ser 7.82 (H) 0.44 - 1.00 mg/dL   Calcium 9.0 8.9 - 95.6 mg/dL   GFR, Estimated 47 (L) >60 mL/min    Comment: (NOTE) Calculated using the CKD-EPI Creatinine Equation (2021)    Anion gap 11 5 - 15    Comment: Performed at [facility] Lab, [address], [place], [place] [address]  Glucose, capillary     Status: Abnormal   Collection Time: 11/30/22  5:55 AM  Result Value Ref Range   Glucose-Capillary 127 (H) 70 - 99 mg/dL    Comment: Glucose reference range applies only to samples taken after fasting for at least 8 hours.   No results found.  Review of Systems  All other systems reviewed and are negative.   Blood pressure (!) 121/55, pulse 75, temperature 98.4 F (36.9 C), resp. rate 18, height 5\' 4"  (1.626 m), weight 90.7 kg. Physical Exam  Examination patient is alert and oriented no adenopathy  well-dressed normal affect normal respiratory effort.  Examination there is no ascending cellulitis there is wound dehiscence with exposed bone. Assessment/Plan Assessment: Dehiscence right transmetatarsal amputation.  Plan will plan for revision right transmetatarsal amputation.  Risk and benefits were discussed including infection nonhealing wound need for additional surgery.  Patient states she understands wished to proceed at this time.  Patient will try Stata work for this next week and minimize weightbearing.  Nadara Mustard, MD 11/30/2022, 6:48 AM

## 2022-11-30 NOTE — Op Note (Signed)
11/30/2022  7:51 AM  PATIENT:  Kayla Schmitt    PRE-OPERATIVE DIAGNOSIS:  Dehiscence Right Transmetatarsal Amputation  POST-OPERATIVE DIAGNOSIS:  Same  PROCEDURE: Right show part amputation. Application Kerecis micro graft 38 cm. Application of Prevena 13 cm wound VAC.  SURGEON:  Nadara Mustard, MD  PHYSICIAN ASSISTANT:None ANESTHESIA:   General  PREOPERATIVE INDICATIONS:  [name] is a  57 y.o. female with a diagnosis of Dehiscence Right Transmetatarsal Amputation who failed conservative measures and elected for surgical management.    The risks benefits and alternatives were discussed with the patient preoperatively including but not limited to the risks of infection, bleeding, nerve injury, cardiopulmonary complications, the need for revision surgery, among others, and the patient was willing to proceed.  OPERATIVE IMPLANTS:   Implant Name Type Inv. Item Serial No. Manufacturer Lot No. LRB No. Used Action  GRAFT SKIN WND MICRO 38 - [id number] Tissue GRAFT SKIN WND MICRO 38  [employer] [id number] Right 1 Implanted    @ENCIMAGES @  OPERATIVE FINDINGS: Tissue margins were clear though abscess.  Bone margins are clear.  OPERATIVE PROCEDURE: Patient brought the operating room underwent a MAC anesthetic.  The right lower extremity was then prepped using DuraPrep draped into a sterile field a timeout was called.  Patient underwent a digital block with 20 cc of 1% lidocaine plain.  After close anesthesia obtained a fishmouth incision was made to encompass the necrotic tissue and exposed bone.  A reciprocating saw was used to perform a show part amputation.  Electrocautery was used hemostasis the wound was irrigated with Vashe.  Kerecis micro graft 38 cm was applied.  2-0 nylon was used to close the wound.  13 cm Prevena wound VAC was applied this had a good suction fit patient was taken the PACU in stable condition   DISCHARGE PLANNING:  Antibiotic duration:  Preoperative antibiotics  Weightbearing: Touchdown weightbearing on the right  Pain medication: Prescription for Percocet  Dressing care/ Wound VAC: Wound VAC  Ambulatory devices: Walker at home  Discharge to: Home.  Follow-up: In the office 1 week post operative.

## 2022-11-30 NOTE — Anesthesia Preprocedure Evaluation (Addendum)
Anesthesia Evaluation  Patient identified by MRN, date of birth, ID band Patient awake    Reviewed: Allergy & Precautions, NPO status , Patient's Chart, lab work & pertinent test results  Airway Mallampati: II  TM Distance: >3 FB Neck ROM: Full    Dental  (+) Upper Dentures   Pulmonary former smoker   Pulmonary exam normal        Cardiovascular hypertension, Pt. on medications and Pt. on home beta blockers + CAD, + Past MI, + Peripheral Vascular Disease and +CHF  Normal cardiovascular exam  ECHO: 1. Left ventricular ejection fraction, by estimation, is 50 to 55%. The  left ventricle has low normal function. The left ventricle has no regional  wall motion abnormalities. There is mild left ventricular hypertrophy.  Left ventricular diastolic  parameters are consistent with Grade II diastolic dysfunction  (pseudonormalization). Elevated left atrial pressure.   2. Right ventricular systolic function is normal. The right ventricular  size is normal. Tricuspid regurgitation signal is inadequate for assessing  PA pressure.   3. The mitral valve is abnormal. Restricted posterior leaflet. Moderate  mitral valve regurgitation. Eccentric MR jet, may be underestimating MR  given eccentric jet but normal LA size and E=A wave argues against severe  MR. Recent TEE showed moderate MR   4. The aortic valve is calcified. There is moderate calcification of the  aortic valve. Aortic valve regurgitation is mild. Moderate aortic valve  stenosis. Vmax 2.9 m/s, MG 21 mmHg, AVA 1.2 cm^2, DI 0.30   5. The inferior vena cava is normal in size with greater than 50%  respiratory variability, suggesting right atrial pressure of 3 mmHg.     Neuro/Psych  Neuromuscular disease  negative psych ROS   GI/Hepatic Neg liver ROS,GERD  Medicated and Controlled,,  Endo/Other  diabetes, Insulin DependentHypothyroidism  Patient on GLP-1 Agonist  Renal/GU negative  Renal ROS     Musculoskeletal  (+) Arthritis ,    Abdominal   Peds  Hematology  (+) Blood dyscrasia (Plavix)   Anesthesia Other Findings Dehiscence Right Transmetatarsal Amputation  Reproductive/Obstetrics                             Anesthesia Physical Anesthesia Plan  ASA: 3  Anesthesia Plan: MAC   Post-op Pain Management:    Induction: Intravenous  PONV Risk Score and Plan: 2 and Ondansetron, Dexamethasone, Propofol infusion, Midazolam and Treatment may vary due to age or medical condition  Airway Management Planned: Simple Face Mask  Additional Equipment:   Intra-op Plan:   Post-operative Plan:   Informed Consent: I have reviewed the patients History and Physical, chart, labs and discussed the procedure including the risks, benefits and alternatives for the proposed anesthesia with the patient or authorized representative who has indicated his/her understanding and acceptance.     Dental advisory given  Plan Discussed with: CRNA  Anesthesia Plan Comments:        Anesthesia Quick Evaluation

## 2022-11-30 NOTE — Transfer of Care (Addendum)
Immediate Anesthesia Transfer of Care Note  Patient: Kayla Schmitt  Procedure(s) Performed: REVISION RIGHT TRANSMETATARSAL AMPUTATION (Right)  Patient Location: PACU  Anesthesia Type:MAC  Level of Consciousness: drowsy and patient cooperative  Airway & Oxygen Therapy: Patient Spontanous Breathing and Patient connected to nasal cannula oxygen  Post-op Assessment: Report given to RN, Post -op Vital signs reviewed and stable, and Patient moving all extremities X 4  Post vital signs: Reviewed and stable  Last Vitals:  Vitals Value Taken Time  BP 94/69   Temp    Pulse 73 11/30/22 0756  Resp 12 11/30/22 0756  SpO2 99 % 11/30/22 0756  Vitals shown include unfiled device data.  Last Pain:  Vitals:   11/30/22 0614  PainSc: 0-No pain         Complications: No notable events documented.

## 2022-11-30 NOTE — Anesthesia Postprocedure Evaluation (Signed)
Anesthesia Post Note  Patient: Kayla Schmitt  Procedure(s) Performed: REVISION RIGHT TRANSMETATARSAL AMPUTATION (Right)     Patient location during evaluation: PACU Anesthesia Type: MAC Level of consciousness: awake Pain management: pain level controlled Vital Signs Assessment: post-procedure vital signs reviewed and stable Respiratory status: spontaneous breathing, nonlabored ventilation and respiratory function stable Cardiovascular status: blood pressure returned to baseline and stable Postop Assessment: no apparent nausea or vomiting Anesthetic complications: no   No notable events documented.  Last Vitals:  Vitals:   11/30/22 0554 11/30/22 0815  BP: (!) 121/55 (!) 111/55  Pulse: 75 73  Resp: 18 13  Temp: 36.9 C 36.6 C  SpO2:  99%    Last Pain:  Vitals:   11/30/22 0755  PainSc: 0-No pain                  P 

## 2022-11-30 NOTE — Progress Notes (Signed)
Dr. Lajoyce Corners is aware that pt took Plavix this am.

## 2022-12-03 ENCOUNTER — Encounter (HOSPITAL_COMMUNITY): Payer: Self-pay | Admitting: Orthopedic Surgery

## 2022-12-05 ENCOUNTER — Encounter: Payer: Self-pay | Admitting: Orthopedic Surgery

## 2022-12-05 NOTE — Progress Notes (Signed)
Office Visit Note   Patient: Kayla Schmitt           Date of Birth: 22-Jun-1965           MRN: [id number] Visit Date: 11/22/2022              Requested by: Medicine, [facility] Internal [address] [place],  [place] [address] PCP: Medicine, [facility] Internal  Chief Complaint  Patient presents with   Right Foot - Follow-up    08/08/2022 right TMA pt was scheduled for surgery and had to cx due to outage      HPI: Patient is a 57 year old woman status post right transmetatarsal amputation with dehiscence.  Patient's revision surgery was canceled secondary to the computer outage.  Assessment & Plan: Visit Diagnoses:  1. History of transmetatarsal amputation of foot (HCC)     Plan: Will plan to proceed with right transmetatarsal amputation revision.  Follow-Up Instructions: Return in about 2 weeks (around 12/06/2022).   Ortho Exam  Patient is alert, oriented, no adenopathy, well-dressed, normal affect, normal respiratory effort. There is persistent wound dehiscence.  Imaging: No results found. No images are attached to the encounter.  Labs: Lab Results  Component Value Date   HGBA1C 6.4 (H) 08/02/2022   HGBA1C 11.0 (H) 01/11/2020   ESRSEDRATE 136 (H) 01/14/2020   ESRSEDRATE 126 (H) 01/13/2020   ESRSEDRATE 125 (H) 01/12/2020   CRP 11.3 (H) 01/14/2020   CRP 13.0 (H) 01/13/2020   CRP 15.8 (H) 01/12/2020   LABURIC 7.7 (H) 02/04/2022   REPTSTATUS 08/07/2022 FINAL 08/02/2022   CULT  08/02/2022    NO GROWTH 5 DAYS Performed at [facility], [address] [place], [place] [address]      Lab Results  Component Value Date   ALBUMIN 1.6 (L) 08/06/2022   ALBUMIN 1.7 (L) 08/05/2022   ALBUMIN 1.9 (L) 08/04/2022    Lab Results  Component Value Date   MG 1.9 08/25/2022   MG 1.7 08/24/2022   MG 1.7 08/23/2022   No results found for: "VD25OH"  No results found for: "PREALBUMIN"    Latest Ref Rng & Units 11/30/2022    5:53 AM 08/25/2022    2:35 AM 08/24/2022    3:09  AM  CBC EXTENDED  WBC 4.0 - 10.5 K/uL 7.7  4.9  4.6   RBC 3.87 - 5.11 MIL/uL 3.03  2.83  2.74   Hemoglobin 12.0 - 15.0 g/dL 8.9  7.8  7.7   HCT 82.9 - 46.0 % 27.1  25.9  24.4   Platelets 150 - 400 K/uL 208  327  298      There is no height or weight on file to calculate BMI.  Orders:  No orders of the defined types were placed in this encounter.  No orders of the defined types were placed in this encounter.    Procedures: No procedures performed  Clinical Data: No additional findings.  ROS:  All other systems negative, except as noted in the HPI. Review of Systems  Objective: Vital Signs: There were no vitals taken for this visit.  Specialty Comments:  No specialty comments available.  PMFS History: Patient Active Problem List   Diagnosis Date Noted   CAD (coronary artery disease) 11/06/2022   Aortic stenosis 11/06/2022   Mitral regurgitation 08/27/2022   NSTEMI (non-ST elevated myocardial infarction) (HCC) 08/18/2022   Acute clinical systolic heart failure (HCC) 08/18/2022   Acute blood loss anemia 08/18/2022   Anemia 08/18/2022  Critical limb ischemia of both lower extremities (HCC) 06/19/2022   DM type 2 (diabetes mellitus, type 2) (HCC) 01/28/2020   Hyperlipidemia 01/28/2020   Hypertension 01/28/2020   Hypothyroidism 01/28/2020   Obesity 01/28/2020   Ulcerated, foot, right, limited to breakdown of skin (HCC)    Cellulitis of right foot    Peripheral arterial disease (HCC)    Cellulitis 01/11/2020   Blister of leg 01/06/2020   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/28/2017   Chest pain 10/28/2017   GERD (gastroesophageal reflux disease) 10/28/2017   Past Medical History:  Diagnosis Date   Anemia    Aortic stenosis    Arthritis    CHF (congestive heart failure) (HCC)    Coronary artery disease    Diabetes mellitus without complication (HCC)    type 2   GERD (gastroesophageal reflux disease)    Heart murmur    History of blood transfusion     Hypercholesteremia    Hypertension    Hypothyroidism    Mitral regurgitation    Myocardial infarction (HCC)    Peripheral vascular disease (HCC)    PONV (postoperative nausea and vomiting)    Thyroid disease     Family History  Problem Relation Age of Onset   Diabetes Other    CAD Other     Past Surgical History:  Procedure Laterality Date   ABDOMINAL AORTOGRAM W/LOWER EXTREMITY Bilateral 01/14/2020   Procedure: ABDOMINAL AORTOGRAM W/LOWER EXTREMITY;  Surgeon: Cephus Shelling, MD;  [facility];  Service: Cardiovascular;  Laterality: Bilateral;   ABDOMINAL AORTOGRAM W/LOWER EXTREMITY N/A 07/05/2022   Procedure: ABDOMINAL AORTOGRAM W/LOWER EXTREMITY;  Surgeon: Cephus Shelling, MD;  [facility];  Service: Cardiovascular;  Laterality: N/A;   ABDOMINAL AORTOGRAM W/LOWER EXTREMITY N/A 08/02/2022   Procedure: ABDOMINAL AORTOGRAM W/LOWER EXTREMITY;  Surgeon: Cephus Shelling, MD;  [facility];  Service: Cardiovascular;  Laterality: N/A;   ABDOMINAL AORTOGRAM W/LOWER EXTREMITY N/A 08/06/2022   Procedure: ABDOMINAL AORTOGRAM W/LOWER EXTREMITY;  Surgeon: Cephus Shelling, MD;  [facility];  Service: Cardiovascular;  Laterality: N/A;   AMPUTATION Bilateral 07/20/2022   Procedure: AMPUTATION RIGHT GREAT TOE AND SECOND TOE, AMPUTATION LEFT GREAT TOE;  Surgeon: Nadara Mustard, MD;  [facility];  Service: Orthopedics;  Laterality: Bilateral;   AMPUTATION Bilateral 08/08/2022   Procedure: BILATERAL TRANSMETATARSAL AMPUTATION;  Surgeon: Nadara Mustard, MD;  Location: [facility];  Service: Orthopedics;  Laterality: Bilateral;   AMPUTATION TOE     PERIPHERAL VASCULAR ATHERECTOMY Right 01/14/2020   Procedure: PERIPHERAL VASCULAR ATHERECTOMY;  Surgeon: Cephus Shelling, MD;  Location: [facility] INVASIVE CV LAB;  Service: Cardiovascular;  Laterality: Right;  sfa   PERIPHERAL VASCULAR INTERVENTION Right 01/14/2020   Procedure: PERIPHERAL  VASCULAR INTERVENTION;  Surgeon: Cephus Shelling, MD;  [facility];  Service: Cardiovascular;  Laterality: Right;  sfa stent    PERIPHERAL VASCULAR INTERVENTION  07/05/2022   Procedure: PERIPHERAL VASCULAR INTERVENTION;  Surgeon: Cephus Shelling, MD;  [facility];  Service: Cardiovascular;;   PERIPHERAL VASCULAR INTERVENTION  08/02/2022   Procedure: PERIPHERAL VASCULAR INTERVENTION;  Surgeon: Cephus Shelling, MD;  [facility];  Service: Cardiovascular;;   PERIPHERAL VASCULAR INTERVENTION  08/06/2022   Procedure: PERIPHERAL VASCULAR INTERVENTION;  Surgeon: Cephus Shelling, MD;  Location: [facility] INVASIVE CV LAB;  Service: Cardiovascular;;   PERIPHERAL VASCULAR THROMBECTOMY  08/02/2022   Procedure: PERIPHERAL VASCULAR THROMBECTOMY;  Surgeon: Cephus Shelling, MD;  Location: [facility] INVASIVE CV LAB;  Service: Cardiovascular;;   RIGHT/LEFT HEART CATH AND CORONARY ANGIOGRAPHY N/A 08/23/2022   Procedure: RIGHT/LEFT HEART CATH AND CORONARY ANGIOGRAPHY;  Surgeon: [name], MD;  Location: [facility] INVASIVE CV LAB;  Service: Cardiovascular;  Laterality: N/A;   SKIN SPLIT GRAFT Right 03/18/2020   Procedure: SKIN GRAFTING RIGHT FOOT ULCER;  Surgeon: Nadara Mustard, MD;  Location: [facility] OR;  Service: Orthopedics;  Laterality: Right;   STUMP REVISION Right 11/16/2022   Procedure: REVISION AMPUTATION RIGHT FOOT;  Surgeon: Nadara Mustard, MD;  Location: [facility] OR;  Service: Orthopedics;  Laterality: Right;   STUMP REVISION Right 11/30/2022   Procedure: REVISION RIGHT TRANSMETATARSAL AMPUTATION;  Surgeon: Nadara Mustard, MD;  Location: [facility] OR;  Service: Orthopedics;  Laterality: Right;   TEE WITHOUT CARDIOVERSION N/A 08/27/2022   Procedure: TRANSESOPHAGEAL ECHOCARDIOGRAM;  Surgeon: [name], MD;  Location: [facility] INVASIVE CV LAB;  Service: Cardiovascular;  Laterality: N/A;   WISDOM TOOTH EXTRACTION     Social History   Occupational History   Not on  file  Tobacco Use   Smoking status: Former    Current packs/day: 0.00    Types: Cigarettes    Quit date: 01/2017    Years since quitting: 5.8   Smokeless tobacco: Never  Vaping Use   Vaping status: Never Used  Substance and Sexual Activity   Alcohol use: Not Currently   Drug use: Yes    Types: Marijuana    Comment: on ocassion- Last time - 11/13/22   Sexual activity: Yes    Birth control/protection: Post-menopausal

## 2022-12-06 ENCOUNTER — Ambulatory Visit (INDEPENDENT_AMBULATORY_CARE_PROVIDER_SITE_OTHER): Payer: Managed Care, Other (non HMO) | Admitting: Orthopedic Surgery

## 2022-12-06 DIAGNOSIS — Z89431 Acquired absence of right foot: Secondary | ICD-10-CM

## 2022-12-06 DIAGNOSIS — T8781 Dehiscence of amputation stump: Secondary | ICD-10-CM

## 2022-12-06 DIAGNOSIS — Z89439 Acquired absence of unspecified foot: Secondary | ICD-10-CM

## 2022-12-10 ENCOUNTER — Encounter: Payer: Self-pay | Admitting: Orthopedic Surgery

## 2022-12-10 NOTE — Progress Notes (Signed)
Office Visit Note   Patient: Kayla Schmitt           Date of Birth: 01-01-1966           MRN: [id number] Visit Date: 12/06/2022              Requested by: Medicine, [facility] Internal [address] [place],  [place] [address] PCP: Medicine, [facility] Internal  Chief Complaint  Patient presents with   Right Leg - Routine Post Op    11/30/2022 revision right TMA       HPI: Patient is a 57 year old woman who is 1 week status post revision right transmetatarsal amputation.  Assessment & Plan: Visit Diagnoses:  1. History of transmetatarsal amputation of foot (HCC)   2. Dehiscence of amputation stump of right lower extremity (HCC)     Plan: Patient has a new pressure ulcer on the lateral aspect of her right foot.  She will work on pressure offloading Dial soap cleansing dry dressing changes.  Follow-Up Instructions: Return in about 1 week (around 12/13/2022).   Ortho Exam  Patient is alert, oriented, no adenopathy, well-dressed, normal affect, normal respiratory effort. Examination the right foot the incision is well-approximated there is some maceration plantarly.  She has a new pressure ulcer over the lateral foot 1 cm diameter.  Imaging: No results found. No images are attached to the encounter.  Labs: Lab Results  Component Value Date   HGBA1C 6.4 (H) 08/02/2022   HGBA1C 11.0 (H) 01/11/2020   ESRSEDRATE 136 (H) 01/14/2020   ESRSEDRATE 126 (H) 01/13/2020   ESRSEDRATE 125 (H) 01/12/2020   CRP 11.3 (H) 01/14/2020   CRP 13.0 (H) 01/13/2020   CRP 15.8 (H) 01/12/2020   LABURIC 7.7 (H) 02/04/2022   REPTSTATUS 08/07/2022 FINAL 08/02/2022   CULT  08/02/2022    NO GROWTH 5 DAYS Performed at [facility] Lab, [address], [place], [place] [address]      Lab Results  Component Value Date   ALBUMIN 1.6 (L) 08/06/2022   ALBUMIN 1.7 (L) 08/05/2022   ALBUMIN 1.9 (L) 08/04/2022    Lab Results  Component Value Date   MG 1.9 08/25/2022   MG 1.7 08/24/2022   MG 1.7  08/23/2022   No results found for: "VD25OH"  No results found for: "PREALBUMIN"    Latest Ref Rng & Units 11/30/2022    5:53 AM 08/25/2022    2:35 AM 08/24/2022    3:09 AM  CBC EXTENDED  WBC 4.0 - 10.5 K/uL 7.7  4.9  4.6   RBC 3.87 - 5.11 MIL/uL 3.03  2.83  2.74   Hemoglobin 12.0 - 15.0 g/dL 8.9  7.8  7.7   HCT 46.9 - 46.0 % 27.1  25.9  24.4   Platelets 150 - 400 K/uL 208  327  298      There is no height or weight on file to calculate BMI.  Orders:  No orders of the defined types were placed in this encounter.  No orders of the defined types were placed in this encounter.    Procedures: No procedures performed  Clinical Data: No additional findings.  ROS:  All other systems negative, except as noted in the HPI. Review of Systems  Objective: Vital Signs: There were no vitals taken for this visit.  Specialty Comments:  No specialty comments available.  PMFS History: Patient Active Problem List   Diagnosis Date Noted   CAD (coronary artery disease) 11/06/2022   Aortic stenosis 11/06/2022  Mitral regurgitation 08/27/2022   NSTEMI (non-ST elevated myocardial infarction) (HCC) 08/18/2022   Acute clinical systolic heart failure (HCC) 08/18/2022   Acute blood loss anemia 08/18/2022   Anemia 08/18/2022   Critical limb ischemia of both lower extremities (HCC) 06/19/2022   DM type 2 (diabetes mellitus, type 2) (HCC) 01/28/2020   Hyperlipidemia 01/28/2020   Hypertension 01/28/2020   Hypothyroidism 01/28/2020   Obesity 01/28/2020   Ulcerated, foot, right, limited to breakdown of skin (HCC)    Cellulitis of right foot    Peripheral arterial disease (HCC)    Cellulitis 01/11/2020   Blister of leg 01/06/2020   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/28/2017   Chest pain 10/28/2017   GERD (gastroesophageal reflux disease) 10/28/2017   Past Medical History:  Diagnosis Date   Anemia    Aortic stenosis    Arthritis    CHF (congestive heart failure) (HCC)     Coronary artery disease    Diabetes mellitus without complication (HCC)    type 2   GERD (gastroesophageal reflux disease)    Heart murmur    History of blood transfusion    Hypercholesteremia    Hypertension    Hypothyroidism    Mitral regurgitation    Myocardial infarction (HCC)    Peripheral vascular disease (HCC)    PONV (postoperative nausea and vomiting)    Thyroid disease     Family History  Problem Relation Age of Onset   Diabetes Other    CAD Other     Past Surgical History:  Procedure Laterality Date   ABDOMINAL AORTOGRAM W/LOWER EXTREMITY Bilateral 01/14/2020   Procedure: ABDOMINAL AORTOGRAM W/LOWER EXTREMITY;  Surgeon: Cephus Shelling, MD;  Location: [facility] LAB;  Service: Cardiovascular;  Laterality: Bilateral;   ABDOMINAL AORTOGRAM W/LOWER EXTREMITY N/A 07/05/2022   Procedure: ABDOMINAL AORTOGRAM W/LOWER EXTREMITY;  Surgeon: Cephus Shelling, MD;  Location: [facility];  Service: Cardiovascular;  Laterality: N/A;   ABDOMINAL AORTOGRAM W/LOWER EXTREMITY N/A 08/02/2022   Procedure: ABDOMINAL AORTOGRAM W/LOWER EXTREMITY;  Surgeon: Cephus Shelling, MD;  [facility]: [facility];  Service: Cardiovascular;  Laterality: N/A;   ABDOMINAL AORTOGRAM W/LOWER EXTREMITY N/A 08/06/2022   Procedure: ABDOMINAL AORTOGRAM W/LOWER EXTREMITY;  Surgeon: Cephus Shelling, MD;  Location: [facility];  Service: Cardiovascular;  Laterality: N/A;   AMPUTATION Bilateral 07/20/2022   Procedure: AMPUTATION RIGHT GREAT TOE AND SECOND TOE, AMPUTATION LEFT GREAT TOE;  Surgeon: Nadara Mustard, MD;  Location: [facility] OR;  Service: Orthopedics;  Laterality: Bilateral;   AMPUTATION Bilateral 08/08/2022   Procedure: BILATERAL TRANSMETATARSAL AMPUTATION;  Surgeon: Nadara Mustard, MD;  Location: [facility] OR;  Service: Orthopedics;  Laterality: Bilateral;   AMPUTATION TOE     PERIPHERAL VASCULAR ATHERECTOMY Right 01/14/2020   Procedure: PERIPHERAL VASCULAR ATHERECTOMY;  Surgeon:  Cephus Shelling, MD;  Location: [facility] INVASIVE CV LAB;  Service: Cardiovascular;  Laterality: Right;  sfa   PERIPHERAL VASCULAR INTERVENTION Right 01/14/2020   Procedure: PERIPHERAL VASCULAR INTERVENTION;  Surgeon: Cephus Shelling, MD;  [facility]: [facility];  Service: Cardiovascular;  Laterality: Right;  sfa stent    PERIPHERAL VASCULAR INTERVENTION  07/05/2022   Procedure: PERIPHERAL VASCULAR INTERVENTION;  Surgeon: Cephus Shelling, MD;  [facility];  Service: Cardiovascular;;   PERIPHERAL VASCULAR INTERVENTION  08/02/2022   Procedure: PERIPHERAL VASCULAR INTERVENTION;  Surgeon: Cephus Shelling, MD;  [facility];  Service: Cardiovascular;;   PERIPHERAL VASCULAR INTERVENTION  08/06/2022  Procedure: PERIPHERAL VASCULAR INTERVENTION;  Surgeon: Cephus Shelling, MD;  Location: [facility] INVASIVE CV LAB;  Service: Cardiovascular;;   PERIPHERAL VASCULAR THROMBECTOMY  08/02/2022   Procedure: PERIPHERAL VASCULAR THROMBECTOMY;  Surgeon: Cephus Shelling, MD;  [facility]: [facility];  Service: Cardiovascular;;   RIGHT/LEFT HEART CATH AND CORONARY ANGIOGRAPHY N/A 08/23/2022   Procedure: RIGHT/LEFT HEART CATH AND CORONARY ANGIOGRAPHY;  Surgeon: [name], MD;  Location: [facility] INVASIVE CV LAB;  Service: Cardiovascular;  Laterality: N/A;   SKIN SPLIT GRAFT Right 03/18/2020   Procedure: SKIN GRAFTING RIGHT FOOT ULCER;  Surgeon: Nadara Mustard, MD;  Location: [facility] OR;  Service: Orthopedics;  Laterality: Right;   STUMP REVISION Right 11/16/2022   Procedure: REVISION AMPUTATION RIGHT FOOT;  Surgeon: Nadara Mustard, MD;  Location: [facility] OR;  Service: Orthopedics;  Laterality: Right;   STUMP REVISION Right 11/30/2022   Procedure: REVISION RIGHT TRANSMETATARSAL AMPUTATION;  Surgeon: Nadara Mustard, MD;  Location: [facility] OR;  Service: Orthopedics;  Laterality: Right;   TEE WITHOUT CARDIOVERSION N/A 08/27/2022   Procedure: TRANSESOPHAGEAL ECHOCARDIOGRAM;  Surgeon:  [name], MD;  Location: [facility] INVASIVE CV LAB;  Service: Cardiovascular;  Laterality: N/A;   WISDOM TOOTH EXTRACTION     Social History   Occupational History   Not on file  Tobacco Use   Smoking status: Former    Current packs/day: 0.00    Types: Cigarettes    Quit date: 01/2017    Years since quitting: 5.8   Smokeless tobacco: Never  Vaping Use   Vaping status: Never Used  Substance and Sexual Activity   Alcohol use: Not Currently   Drug use: Yes    Types: Marijuana    Comment: on ocassion- Last time - 11/13/22   Sexual activity: Yes    Birth control/protection: Post-menopausal

## 2022-12-11 ENCOUNTER — Telehealth: Payer: Self-pay | Admitting: Orthopedic Surgery

## 2022-12-11 NOTE — Telephone Encounter (Signed)
Patient called and said she has a wound picture that she wants you to see.CB#[phone]

## 2022-12-11 NOTE — Telephone Encounter (Signed)
I called pt and she states that she wants to send a picture of her foot for Dr. Lajoyce Corners to review. Instructed pt to send via mychart and she will try to do this today also gave Dr. Audrie Lia number per her request to text as well. Pt has an appt on Thursday and said that they are not any s/s of infection but she is concerned and wants him to see it and will come earlier if he says that she needs to .

## 2022-12-12 DIAGNOSIS — M86171 Other acute osteomyelitis, right ankle and foot: Secondary | ICD-10-CM

## 2022-12-13 ENCOUNTER — Ambulatory Visit (INDEPENDENT_AMBULATORY_CARE_PROVIDER_SITE_OTHER): Payer: Managed Care, Other (non HMO) | Admitting: Orthopedic Surgery

## 2022-12-13 DIAGNOSIS — Z89439 Acquired absence of unspecified foot: Secondary | ICD-10-CM

## 2022-12-13 DIAGNOSIS — Z89431 Acquired absence of right foot: Secondary | ICD-10-CM

## 2022-12-13 DIAGNOSIS — T8781 Dehiscence of amputation stump: Secondary | ICD-10-CM

## 2022-12-13 MED ORDER — SULFAMETHOXAZOLE-TRIMETHOPRIM 800-160 MG PO TABS: 1.0000 | ORAL_TABLET | Freq: Two times a day (BID) | ORAL | 0 refills | Status: DC

## 2022-12-14 ENCOUNTER — Encounter: Payer: Self-pay | Admitting: Orthopedic Surgery

## 2022-12-14 NOTE — Progress Notes (Signed)
Office Visit Note   Patient: Kayla Schmitt           Date of Birth: 08-22-1965           MRN: [id number] Visit Date: 12/13/2022              Requested by: Medicine, [facility] Internal [address] [place],  [place] [address] PCP: Medicine, [facility] Internal  Chief Complaint  Patient presents with   Right Foot - Routine Post Op    11/30/2022 revision right TMA       HPI: Patient is a 57 year old woman who is 2 weeks status post revision right transmetatarsal amputation.  Assessment & Plan: Visit Diagnoses:  1. History of transmetatarsal amputation of foot (HCC)   2. Dehiscence of amputation stump of right lower extremity (HCC)     Plan: Prescription provided for Bactrim DS.  Continue elevation minimize weightbearing Dial soap cleansing and dry dressing.  Follow-Up Instructions: Return in about 1 week (around 12/20/2022).   Ortho Exam  Patient is alert, oriented, no adenopathy, well-dressed, normal affect, normal respiratory effort. Examination patient has ischemic changes to the surgical incision there is no cellulitis or dermatitis.  No full-thickness wound dehiscence.  Imaging: No results found. No images are attached to the encounter.  Labs: Lab Results  Component Value Date   HGBA1C 6.4 (H) 08/02/2022   HGBA1C 11.0 (H) 01/11/2020   ESRSEDRATE 136 (H) 01/14/2020   ESRSEDRATE 126 (H) 01/13/2020   ESRSEDRATE 125 (H) 01/12/2020   CRP 11.3 (H) 01/14/2020   CRP 13.0 (H) 01/13/2020   CRP 15.8 (H) 01/12/2020   LABURIC 7.7 (H) 02/04/2022   REPTSTATUS 08/07/2022 FINAL 08/02/2022   CULT  08/02/2022    NO GROWTH 5 DAYS Performed at [facility], [address], [place], [place] [address]      Lab Results  Component Value Date   ALBUMIN 1.6 (L) 08/06/2022   ALBUMIN 1.7 (L) 08/05/2022   ALBUMIN 1.9 (L) 08/04/2022    Lab Results  Component Value Date   MG 1.9 08/25/2022   MG 1.7 08/24/2022   MG 1.7 08/23/2022   No results found for: "VD25OH"  No results  found for: "PREALBUMIN"    Latest Ref Rng & Units 11/30/2022    5:53 AM 08/25/2022    2:35 AM 08/24/2022    3:09 AM  CBC EXTENDED  WBC 4.0 - 10.5 K/uL 7.7  4.9  4.6   RBC 3.87 - 5.11 MIL/uL 3.03  2.83  2.74   Hemoglobin 12.0 - 15.0 g/dL 8.9  7.8  7.7   HCT 32.4 - 46.0 % 27.1  25.9  24.4   Platelets 150 - 400 K/uL 208  327  298      There is no height or weight on file to calculate BMI.  Orders:  No orders of the defined types were placed in this encounter.  Meds ordered this encounter  Medications   sulfamethoxazole-trimethoprim (BACTRIM DS) 800-160 MG tablet    Sig: Take 1 tablet by mouth 2 (two) times daily.    Dispense:  30 tablet    Refill:  0     Procedures: No procedures performed  Clinical Data: No additional findings.  ROS:  All other systems negative, except as noted in the HPI. Review of Systems  Objective: Vital Signs: There were no vitals taken for this visit.  Specialty Comments:  No specialty comments available.  PMFS History: Patient Active Problem List   Diagnosis Date Noted  Osteomyelitis of foot, right, acute (HCC) 12/12/2022   CAD (coronary artery disease) 11/06/2022   Aortic stenosis 11/06/2022   Mitral regurgitation 08/27/2022   NSTEMI (non-ST elevated myocardial infarction) (HCC) 08/18/2022   Acute clinical systolic heart failure (HCC) 08/18/2022   Acute blood loss anemia 08/18/2022   Anemia 08/18/2022   Critical limb ischemia of both lower extremities (HCC) 06/19/2022   DM type 2 (diabetes mellitus, type 2) (HCC) 01/28/2020   Hyperlipidemia 01/28/2020   Hypertension 01/28/2020   Hypothyroidism 01/28/2020   Obesity 01/28/2020   Ulcerated, foot, right, limited to breakdown of skin (HCC)    Cellulitis of right foot    Peripheral arterial disease (HCC)    Cellulitis 01/11/2020   Blister of leg 01/06/2020   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/28/2017   Chest pain 10/28/2017   GERD (gastroesophageal reflux disease) 10/28/2017    Past Medical History:  Diagnosis Date   Anemia    Aortic stenosis    Arthritis    CHF (congestive heart failure) (HCC)    Coronary artery disease    Diabetes mellitus without complication (HCC)    type 2   GERD (gastroesophageal reflux disease)    Heart murmur    History of blood transfusion    Hypercholesteremia    Hypertension    Hypothyroidism    Mitral regurgitation    Myocardial infarction (HCC)    Peripheral vascular disease (HCC)    PONV (postoperative nausea and vomiting)    Thyroid disease     Family History  Problem Relation Age of Onset   Diabetes Other    CAD Other     Past Surgical History:  Procedure Laterality Date   ABDOMINAL AORTOGRAM W/LOWER EXTREMITY Bilateral 01/14/2020   Procedure: ABDOMINAL AORTOGRAM W/LOWER EXTREMITY;  Surgeon: Cephus Shelling, MD;  Location: [facility];  Service: Cardiovascular;  Laterality: Bilateral;   ABDOMINAL AORTOGRAM W/LOWER EXTREMITY N/A 07/05/2022   Procedure: ABDOMINAL AORTOGRAM W/LOWER EXTREMITY;  Surgeon: Cephus Shelling, MD;  Location: [facility];  Service: Cardiovascular;  Laterality: N/A;   ABDOMINAL AORTOGRAM W/LOWER EXTREMITY N/A 08/02/2022   Procedure: ABDOMINAL AORTOGRAM W/LOWER EXTREMITY;  Surgeon: Cephus Shelling, MD;  Location: [facility];  Service: Cardiovascular;  Laterality: N/A;   ABDOMINAL AORTOGRAM W/LOWER EXTREMITY N/A 08/06/2022   Procedure: ABDOMINAL AORTOGRAM W/LOWER EXTREMITY;  Surgeon: Cephus Shelling, MD;  [facility]: [facility];  Service: Cardiovascular;  Laterality: N/A;   AMPUTATION Bilateral 07/20/2022   Procedure: AMPUTATION RIGHT GREAT TOE AND SECOND TOE, AMPUTATION LEFT GREAT TOE;  Surgeon: Nadara Mustard, MD;  Location: [facility] OR;  Service: Orthopedics;  Laterality: Bilateral;   AMPUTATION Bilateral 08/08/2022   Procedure: BILATERAL TRANSMETATARSAL AMPUTATION;  Surgeon: Nadara Mustard, MD;  Location: [facility] OR;  Service: Orthopedics;  Laterality: Bilateral;    AMPUTATION TOE     PERIPHERAL VASCULAR ATHERECTOMY Right 01/14/2020   Procedure: PERIPHERAL VASCULAR ATHERECTOMY;  Surgeon: Cephus Shelling, MD;  Location: [facility] INVASIVE CV LAB;  Service: Cardiovascular;  Laterality: Right;  sfa   PERIPHERAL VASCULAR INTERVENTION Right 01/14/2020   Procedure: PERIPHERAL VASCULAR INTERVENTION;  Surgeon: Cephus Shelling, MD;  Location: [facility];  Service: Cardiovascular;  Laterality: Right;  sfa stent    PERIPHERAL VASCULAR INTERVENTION  07/05/2022   Procedure: PERIPHERAL VASCULAR INTERVENTION;  Surgeon: Cephus Shelling, MD;  [facility]: [facility];  Service: Cardiovascular;;   PERIPHERAL VASCULAR INTERVENTION  08/02/2022   Procedure: PERIPHERAL VASCULAR INTERVENTION;  Surgeon: Cephus Shelling, MD;  Location: [facility] INVASIVE CV LAB;  Service: Cardiovascular;;   PERIPHERAL VASCULAR INTERVENTION  08/06/2022   Procedure: PERIPHERAL VASCULAR INTERVENTION;  Surgeon: Cephus Shelling, MD;  Location: [facility] INVASIVE CV LAB;  Service: Cardiovascular;;   PERIPHERAL VASCULAR THROMBECTOMY  08/02/2022   Procedure: PERIPHERAL VASCULAR THROMBECTOMY;  Surgeon: Cephus Shelling, MD;  [facility];  Service: Cardiovascular;;   RIGHT/LEFT HEART CATH AND CORONARY ANGIOGRAPHY N/A 08/23/2022   Procedure: RIGHT/LEFT HEART CATH AND CORONARY ANGIOGRAPHY;  Surgeon: [name], MD;  Location: [facility] INVASIVE CV LAB;  Service: Cardiovascular;  Laterality: N/A;   SKIN SPLIT GRAFT Right 03/18/2020   Procedure: SKIN GRAFTING RIGHT FOOT ULCER;  Surgeon: Nadara Mustard, MD;  Location: [facility] OR;  Service: Orthopedics;  Laterality: Right;   STUMP REVISION Right 11/16/2022   Procedure: REVISION AMPUTATION RIGHT FOOT;  Surgeon: Nadara Mustard, MD;  Location: [facility] OR;  Service: Orthopedics;  Laterality: Right;   STUMP REVISION Right 11/30/2022   Procedure: REVISION RIGHT TRANSMETATARSAL AMPUTATION;  Surgeon: Nadara Mustard, MD;  Location: [facility] OR;  Service:  Orthopedics;  Laterality: Right;   TEE WITHOUT CARDIOVERSION N/A 08/27/2022   Procedure: TRANSESOPHAGEAL ECHOCARDIOGRAM;  Surgeon: [name], MD;  Location: [facility] INVASIVE CV LAB;  Service: Cardiovascular;  Laterality: N/A;   WISDOM TOOTH EXTRACTION     Social History   Occupational History   Not on file  Tobacco Use   Smoking status: Former    Current packs/day: 0.00    Types: Cigarettes    Quit date: 01/2017    Years since quitting: 5.8   Smokeless tobacco: Never  Vaping Use   Vaping status: Never Used  Substance and Sexual Activity   Alcohol use: Not Currently   Drug use: Yes    Types: Marijuana    Comment: on ocassion- Last time - 11/13/22   Sexual activity: Yes    Birth control/protection: Post-menopausal

## 2022-12-20 ENCOUNTER — Ambulatory Visit (INDEPENDENT_AMBULATORY_CARE_PROVIDER_SITE_OTHER): Payer: Managed Care, Other (non HMO) | Admitting: Orthopedic Surgery

## 2022-12-20 ENCOUNTER — Telehealth: Payer: Self-pay | Admitting: Orthopedic Surgery

## 2022-12-20 DIAGNOSIS — T8781 Dehiscence of amputation stump: Secondary | ICD-10-CM

## 2022-12-20 DIAGNOSIS — Z89439 Acquired absence of unspecified foot: Secondary | ICD-10-CM

## 2022-12-20 NOTE — Telephone Encounter (Signed)
Pt was told to return in 1 week. There are no available slots open for Dr. Lajoyce Corners neither Dr. Denny Peon. Pt stated 01/03/23 at 1:30pm is best for her. Please call pt to discuss availability.

## 2022-12-20 NOTE — Telephone Encounter (Signed)
Pt informed that she is scheduled on that date at 1:30

## 2022-12-21 ENCOUNTER — Ambulatory Visit: Payer: Managed Care, Other (non HMO) | Admitting: Family

## 2022-12-27 ENCOUNTER — Encounter (HOSPITAL_COMMUNITY): Payer: Self-pay | Admitting: Orthopedic Surgery

## 2023-01-01 ENCOUNTER — Encounter: Payer: Self-pay | Admitting: Orthopedic Surgery

## 2023-01-01 ENCOUNTER — Ambulatory Visit (INDEPENDENT_AMBULATORY_CARE_PROVIDER_SITE_OTHER): Payer: Managed Care, Other (non HMO) | Admitting: Vascular Surgery

## 2023-01-01 ENCOUNTER — Encounter: Payer: Self-pay | Admitting: Vascular Surgery

## 2023-01-01 VITALS — BP 127/73 | HR 80 | Temp 98.7°F | Resp 18 | Ht 64.0 in | Wt 200.6 lb

## 2023-01-01 DIAGNOSIS — I70223 Atherosclerosis of native arteries of extremities with rest pain, bilateral legs: Secondary | ICD-10-CM | POA: Diagnosis not present

## 2023-01-01 NOTE — Progress Notes (Signed)
Patient is status post revision right transmetatarsal amputation 3 weeks ago.  Patient is on Bactrim DS she will finish this in about a week.  The lateral foot wound was debrided there is partial-thickness ischemic changes.  There is good petechial bleeding.  Continue with protected weightbearing Dial soap cleansing.

## 2023-01-01 NOTE — Progress Notes (Signed)
Patient name: Kayla Schmitt MRN: [id number] DOB: 01/30/1966 Sex: female  REASON FOR VISIT: 1 month follow-up, wound check right TMA  HPI: Kayla Schmitt is a 57 y.o. female with history of hypertension, hyperlipidemia, diabetes that presents for hospital follow-up and interval wound check.  Most recently on 08/02/2022 she underwent left SFA and above-knee popliteal percutaneous thrombectomy with JETI and additional angioplasty and stenting into the above-knee popliteal artery for an occluded distal SFA stent with tissue loss.  She then on 08/06/2022 underwent laser arthrectomy of the right SFA above-knee popliteal with DCB for in-stent stenosis.  She had bilateral TMA's with Dr. Lajoyce Corners on 08/08/2022.  The left TMA is healed.  The right TMA has been slow to heal.  She has undergoing recent revision of the right TMA with kerecis.    Previously underwent right leg intervention on 01/14/2020 with right SFA atherectomy and angioplasty and stent placement as well as right above-knee popliteal angioplasty.  Her right foot wound previously healed.  Past Medical History:  Diagnosis Date   Anemia    Aortic stenosis    Arthritis    CHF (congestive heart failure) (HCC)    Coronary artery disease    Diabetes mellitus without complication (HCC)    type 2   GERD (gastroesophageal reflux disease)    Heart murmur    History of blood transfusion    Hypercholesteremia    Hypertension    Hypothyroidism    Mitral regurgitation    Myocardial infarction (HCC)    Peripheral vascular disease (HCC)    PONV (postoperative nausea and vomiting)    Thyroid disease     Past Surgical History:  Procedure Laterality Date   ABDOMINAL AORTOGRAM W/LOWER EXTREMITY Bilateral 01/14/2020   Procedure: ABDOMINAL AORTOGRAM W/LOWER EXTREMITY;  Surgeon: Cephus Shelling, MD;  [facility];  Service: Cardiovascular;  Laterality: Bilateral;   ABDOMINAL AORTOGRAM W/LOWER EXTREMITY N/A 07/05/2022    Procedure: ABDOMINAL AORTOGRAM W/LOWER EXTREMITY;  Surgeon: Cephus Shelling, MD;  [facility]: [facility];  Service: Cardiovascular;  Laterality: N/A;   ABDOMINAL AORTOGRAM W/LOWER EXTREMITY N/A 08/02/2022   Procedure: ABDOMINAL AORTOGRAM W/LOWER EXTREMITY;  Surgeon: Cephus Shelling, MD;  [facility];  Service: Cardiovascular;  Laterality: N/A;   ABDOMINAL AORTOGRAM W/LOWER EXTREMITY N/A 08/06/2022   Procedure: ABDOMINAL AORTOGRAM W/LOWER EXTREMITY;  Surgeon: Cephus Shelling, MD;  [facility];  Service: Cardiovascular;  Laterality: N/A;   AMPUTATION Bilateral 07/20/2022   Procedure: AMPUTATION RIGHT GREAT TOE AND SECOND TOE, AMPUTATION LEFT GREAT TOE;  Surgeon: Nadara Mustard, MD;  [facility];  Service: Orthopedics;  Laterality: Bilateral;   AMPUTATION Bilateral 08/08/2022   Procedure: BILATERAL TRANSMETATARSAL AMPUTATION;  Surgeon: Nadara Mustard, MD;  Location: [facility] OR;  Service: Orthopedics;  Laterality: Bilateral;   AMPUTATION TOE     PERIPHERAL VASCULAR ATHERECTOMY Right 01/14/2020   Procedure: PERIPHERAL VASCULAR ATHERECTOMY;  Surgeon: Cephus Shelling, MD;  Location: [facility] INVASIVE CV LAB;  Service: Cardiovascular;  Laterality: Right;  sfa   PERIPHERAL VASCULAR INTERVENTION Right 01/14/2020   Procedure: PERIPHERAL VASCULAR INTERVENTION;  Surgeon: Cephus Shelling, MD;  [facility];  Service: Cardiovascular;  Laterality: Right;  sfa stent    PERIPHERAL VASCULAR INTERVENTION  07/05/2022   Procedure: PERIPHERAL VASCULAR INTERVENTION;  Surgeon: Cephus Shelling, MD;  [facility];  Service: Cardiovascular;;   PERIPHERAL VASCULAR INTERVENTION  08/02/2022   Procedure: PERIPHERAL VASCULAR INTERVENTION;  Surgeon: Cephus Shelling, MD;  Location: [facility] INVASIVE CV LAB;  Service: Cardiovascular;;   PERIPHERAL VASCULAR INTERVENTION  08/06/2022   Procedure: PERIPHERAL VASCULAR INTERVENTION;  Surgeon: Cephus Shelling, MD;  Location: [facility] INVASIVE CV LAB;  Service: Cardiovascular;;   PERIPHERAL VASCULAR THROMBECTOMY  08/02/2022   Procedure: PERIPHERAL VASCULAR THROMBECTOMY;  Surgeon: Cephus Shelling, MD;  [facility];  Service: Cardiovascular;;   RIGHT/LEFT HEART CATH AND CORONARY ANGIOGRAPHY N/A 08/23/2022   Procedure: RIGHT/LEFT HEART CATH AND CORONARY ANGIOGRAPHY;  Surgeon: [name], MD;  Location: [facility] INVASIVE CV LAB;  Service: Cardiovascular;  Laterality: N/A;   SKIN SPLIT GRAFT Right 03/18/2020   Procedure: SKIN GRAFTING RIGHT FOOT ULCER;  Surgeon: Nadara Mustard, MD;  Location: [facility] OR;  Service: Orthopedics;  Laterality: Right;   STUMP REVISION Right 11/30/2022   Procedure: REVISION RIGHT TRANSMETATARSAL AMPUTATION;  Surgeon: Nadara Mustard, MD;  Location: [facility] OR;  Service: Orthopedics;  Laterality: Right;   TEE WITHOUT CARDIOVERSION N/A 08/27/2022   Procedure: TRANSESOPHAGEAL ECHOCARDIOGRAM;  Surgeon: [name], MD;  Location: [facility] INVASIVE CV LAB;  Service: Cardiovascular;  Laterality: N/A;   WISDOM TOOTH EXTRACTION      Family History  Problem Relation Age of Onset   Diabetes Other    CAD Other     SOCIAL HISTORY: Social History   Tobacco Use   Smoking status: Former    Current packs/day: 0.00    Types: Cigarettes    Quit date: 01/2017    Years since quitting: 5.9   Smokeless tobacco: Never  Substance Use Topics   Alcohol use: Not Currently    Allergies  Allergen Reactions   Doxycycline Nausea And Vomiting    Extremely N/V   Trental [Pentoxifylline] Nausea And Vomiting   Penicillins Rash    Tolerated cephalosporins 07/2022     Current Outpatient Medications  Medication Sig Dispense Refill   acetaminophen (TYLENOL) 500 MG tablet Take 500-1,000 mg by mouth every 6 (six) hours as needed for moderate pain.     amLODipine (NORVASC) 10 MG tablet Take 10 mg by mouth daily.     aspirin 81 MG EC tablet Take 81 mg by mouth daily.      atorvastatin (LIPITOR) 40 MG tablet Take 40 mg by mouth daily.     clopidogrel (PLAVIX) 75 MG tablet TAKE 1 TABLET BY MOUTH EVERY DAY WITH BREAKFAST (Patient taking differently: Take 75 mg by mouth daily.) 90 tablet 3   diphenhydrAMINE HCl, Sleep, (ZZZQUIL) 25 MG CAPS Take 25 mg by mouth at bedtime as needed (Sleep).     furosemide (LASIX) 40 MG tablet Take 1 tablet (40 mg total) by mouth 2 (two) times daily. 30 tablet 3   gabapentin (NEURONTIN) 300 MG capsule Take 300 mg by mouth at bedtime.     insulin degludec (TRESIBA FLEXTOUCH) 100 UNIT/ML FlexTouch Pen Inject 10 Units into the skin at bedtime. 3 mL 3   levothyroxine (SYNTHROID) 150 MCG tablet Take 150 mcg by mouth daily.     lisinopril (ZESTRIL) 2.5 MG tablet Take 1 tablet (2.5 mg total) by mouth daily. 90 tablet 2   metoprolol succinate (TOPROL-XL) 25 MG 24 hr tablet Take 0.5 tablets (12.5 mg total) by mouth daily. 30 tablet 1   nitroGLYCERIN (NITROSTAT) 0.4 MG SL tablet Place 1 tablet (0.4 mg total) under the tongue every 5 (five) minutes x 3 doses as needed for chest pain (if no relief after 3rd dose proceed to ED or  call 911). 11/06/2022-New 25 tablet 1   oxyCODONE-acetaminophen (PERCOCET/ROXICET) 5-325 MG tablet Take 1 tablet by mouth every 4 (four) hours as needed. 30 tablet 0   pantoprazole (PROTONIX) 40 MG tablet Take 40 mg by mouth daily.     Semaglutide, 1 MG/DOSE, (OZEMPIC, 1 MG/DOSE,) 2 MG/1.5ML SOPN Inject 0.25 mg into the skin every Saturday.     sulfamethoxazole-trimethoprim (BACTRIM DS) 800-160 MG tablet Take 1 tablet by mouth 2 (two) times daily. 30 tablet 0   Zinc 50 MG TABS Take 50 mg by mouth daily.     No current facility-administered medications for this visit.    REVIEW OF SYSTEMS:  [X]  denotes positive finding, [ ]  denotes negative finding Cardiac  Comments:  Chest pain or chest pressure:    Shortness of breath upon exertion:    Short of breath when lying flat:    Irregular heart rhythm:        Vascular     Pain in calf, thigh, or hip brought on by ambulation:    Pain in feet at night that wakes you up from your sleep:     Blood clot in your veins:    Leg swelling:         Pulmonary    Oxygen at home:    Productive cough:     Wheezing:         Neurologic    Sudden weakness in arms or legs:     Sudden numbness in arms or legs:     Sudden onset of difficulty speaking or slurred speech:    Temporary loss of vision in one eye:     Problems with dizziness:         Gastrointestinal    Blood in stool:     Vomited blood:         Genitourinary    Burning when urinating:     Blood in urine:        Psychiatric    Major depression:         Hematologic    Bleeding problems:    Problems with blood clotting too easily:        Skin    Rashes or ulcers:        Constitutional    Fever or chills:      PHYSICAL EXAM: Vitals:   01/01/23 1532  BP: 127/73  Pulse: 80  Resp: 18  Temp: 98.7 F (37.1 C)  TempSrc: Temporal  SpO2: 98%  Weight: 200 lb 9.6 oz (91 kg)  Height: 5\' 4"  (1.626 m)    GENERAL: The patient is a well-nourished female, in no acute distress. The vital signs are documented above. CARDIAC: There is a regular rate and rhythm.  VASCULAR:  Bilateral femoral pulses palpable Left TMA healed  Right TMA as pictured Brisk right PT and DP at the ankle             DATA:   ABIs 10/09/22 are 1.02 Right and 1.02 left    Assessment/Plan:  57 year old female presents for 1 month interval follow-up after recent bilateral lower extremity multilevel complex revascularization for critical limb ischemia with tissue loss.  She has since undergone bilateral TMA's with Dr. Lajoyce Corners on 08/08/2022.  The left TMA is healed.  The right TMA had some dehiscence and required wound care and ultimately underwent revision in the OR with Dr. Lajoyce Corners with Kerecis grafting as recently as 11/30/22.  Today she still has brisk multiphasic anterior tibial and posterior  tibial signals at the ankle.   I think she should have more than enough inflow to heal this.  I will see her again in 1 month with repeat right leg arterial duplex as it has been almost 3 months since non-invasive imaging.  Discussed she let me know if Dr. Lajoyce Corners feels the [facility] grafting fails and we can entertain another angiogram.  Cephus Shelling, MD [facility]: [phone]

## 2023-01-03 ENCOUNTER — Ambulatory Visit (INDEPENDENT_AMBULATORY_CARE_PROVIDER_SITE_OTHER): Payer: Managed Care, Other (non HMO) | Admitting: Orthopedic Surgery

## 2023-01-03 DIAGNOSIS — Z89439 Acquired absence of unspecified foot: Secondary | ICD-10-CM

## 2023-01-03 DIAGNOSIS — T8781 Dehiscence of amputation stump: Secondary | ICD-10-CM

## 2023-01-09 ENCOUNTER — Encounter: Payer: Self-pay | Admitting: Orthopedic Surgery

## 2023-01-09 NOTE — Progress Notes (Signed)
Office Visit Note   Patient: Kayla Schmitt           Date of Birth: 01-12-1966           MRN: [id number] Visit Date: 01/03/2023              Requested by: Medicine, [facility] Internal [address] [place],  [place] [address] PCP: Medicine, [facility] Internal  Chief Complaint  Patient presents with   Right Foot - Routine Post Op    11/30/2022 revision right TMA       HPI: Patient is a 57 year old woman who is status post revision transmetatarsal amputation 4 weeks ago.  Assessment & Plan: Visit Diagnoses:  1. History of transmetatarsal amputation of foot (HCC)   2. Dehiscence of amputation stump of right lower extremity (HCC)     Plan: Patient may proceed with seated work.  Continue wound care.  Sutures are harvested.  Patient also has follow-up with vascular vein surgery.  Follow-Up Instructions: Return in about 2 weeks (around 01/17/2023).   Ortho Exam  Patient is alert, oriented, no adenopathy, well-dressed, normal affect, normal respiratory effort. Examination patient has 50% healing 25% granulation tissue 25% eschar.  We will harvest the sutures today.  Imaging: No results found. No images are attached to the encounter.  Labs: Lab Results  Component Value Date   HGBA1C 6.4 (H) 08/02/2022   HGBA1C 11.0 (H) 01/11/2020   ESRSEDRATE 136 (H) 01/14/2020   ESRSEDRATE 126 (H) 01/13/2020   ESRSEDRATE 125 (H) 01/12/2020   CRP 11.3 (H) 01/14/2020   CRP 13.0 (H) 01/13/2020   CRP 15.8 (H) 01/12/2020   LABURIC 7.7 (H) 02/04/2022   REPTSTATUS 08/07/2022 FINAL 08/02/2022   CULT  08/02/2022    NO GROWTH 5 DAYS Performed at [facility], [address], [place], [place] [address]      Lab Results  Component Value Date   ALBUMIN 1.6 (L) 08/06/2022   ALBUMIN 1.7 (L) 08/05/2022   ALBUMIN 1.9 (L) 08/04/2022    Lab Results  Component Value Date   MG 1.9 08/25/2022   MG 1.7 08/24/2022   MG 1.7 08/23/2022   No results found for: "VD25OH"  No results found for:  "PREALBUMIN"    Latest Ref Rng & Units 11/30/2022    5:53 AM 08/25/2022    2:35 AM 08/24/2022    3:09 AM  CBC EXTENDED  WBC 4.0 - 10.5 K/uL 7.7  4.9  4.6   RBC 3.87 - 5.11 MIL/uL 3.03  2.83  2.74   Hemoglobin 12.0 - 15.0 g/dL 8.9  7.8  7.7   HCT 29.5 - 46.0 % 27.1  25.9  24.4   Platelets 150 - 400 K/uL 208  327  298      There is no height or weight on file to calculate BMI.  Orders:  No orders of the defined types were placed in this encounter.  No orders of the defined types were placed in this encounter.    Procedures: No procedures performed  Clinical Data: No additional findings.  ROS:  All other systems negative, except as noted in the HPI. Review of Systems  Objective: Vital Signs: There were no vitals taken for this visit.  Specialty Comments:  No specialty comments available.  PMFS History: Patient Active Problem List   Diagnosis Date Noted   Osteomyelitis of foot, right, acute (HCC) 12/12/2022   CAD (coronary artery disease) 11/06/2022   Aortic stenosis 11/06/2022   Mitral regurgitation 08/27/2022  NSTEMI (non-ST elevated myocardial infarction) (HCC) 08/18/2022   Acute clinical systolic heart failure (HCC) 08/18/2022   Acute blood loss anemia 08/18/2022   Anemia 08/18/2022   Critical limb ischemia of both lower extremities (HCC) 06/19/2022   DM type 2 (diabetes mellitus, type 2) (HCC) 01/28/2020   Hyperlipidemia 01/28/2020   Hypertension 01/28/2020   Hypothyroidism 01/28/2020   Obesity 01/28/2020   Ulcerated, foot, right, limited to breakdown of skin (HCC)    Cellulitis of right foot    Peripheral arterial disease (HCC)    Cellulitis 01/11/2020   Blister of leg 01/06/2020   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/28/2017   Chest pain 10/28/2017   GERD (gastroesophageal reflux disease) 10/28/2017   Past Medical History:  Diagnosis Date   Anemia    Aortic stenosis    Arthritis    CHF (congestive heart failure) (HCC)    Coronary artery  disease    Diabetes mellitus without complication (HCC)    type 2   GERD (gastroesophageal reflux disease)    Heart murmur    History of blood transfusion    Hypercholesteremia    Hypertension    Hypothyroidism    Mitral regurgitation    Myocardial infarction (HCC)    Peripheral vascular disease (HCC)    PONV (postoperative nausea and vomiting)    Thyroid disease     Family History  Problem Relation Age of Onset   Diabetes Other    CAD Other     Past Surgical History:  Procedure Laterality Date   ABDOMINAL AORTOGRAM W/LOWER EXTREMITY Bilateral 01/14/2020   Procedure: ABDOMINAL AORTOGRAM W/LOWER EXTREMITY;  Surgeon: Cephus Shelling, MD;  [facility];  Service: Cardiovascular;  Laterality: Bilateral;   ABDOMINAL AORTOGRAM W/LOWER EXTREMITY N/A 07/05/2022   Procedure: ABDOMINAL AORTOGRAM W/LOWER EXTREMITY;  Surgeon: Cephus Shelling, MD;  [facility];  Service: Cardiovascular;  Laterality: N/A;   ABDOMINAL AORTOGRAM W/LOWER EXTREMITY N/A 08/02/2022   Procedure: ABDOMINAL AORTOGRAM W/LOWER EXTREMITY;  Surgeon: Cephus Shelling, MD;  [facility];  Service: Cardiovascular;  Laterality: N/A;   ABDOMINAL AORTOGRAM W/LOWER EXTREMITY N/A 08/06/2022   Procedure: ABDOMINAL AORTOGRAM W/LOWER EXTREMITY;  Surgeon: Cephus Shelling, MD;  [facility];  Service: Cardiovascular;  Laterality: N/A;   AMPUTATION Bilateral 07/20/2022   Procedure: AMPUTATION RIGHT GREAT TOE AND SECOND TOE, AMPUTATION LEFT GREAT TOE;  Surgeon: Nadara Mustard, MD;  [facility];  Service: Orthopedics;  Laterality: Bilateral;   AMPUTATION Bilateral 08/08/2022   Procedure: BILATERAL TRANSMETATARSAL AMPUTATION;  Surgeon: Nadara Mustard, MD;  Location: [facility] OR;  Service: Orthopedics;  Laterality: Bilateral;   AMPUTATION TOE     PERIPHERAL VASCULAR ATHERECTOMY Right 01/14/2020   Procedure: PERIPHERAL VASCULAR ATHERECTOMY;  Surgeon: Cephus Shelling,  MD;  Location: [facility] INVASIVE CV LAB;  Service: Cardiovascular;  Laterality: Right;  sfa   PERIPHERAL VASCULAR INTERVENTION Right 01/14/2020   Procedure: PERIPHERAL VASCULAR INTERVENTION;  Surgeon: Cephus Shelling, MD;  [facility];  Service: Cardiovascular;  Laterality: Right;  sfa stent    PERIPHERAL VASCULAR INTERVENTION  07/05/2022   Procedure: PERIPHERAL VASCULAR INTERVENTION;  Surgeon: Cephus Shelling, MD;  [facility];  Service: Cardiovascular;;   PERIPHERAL VASCULAR INTERVENTION  08/02/2022   Procedure: PERIPHERAL VASCULAR INTERVENTION;  Surgeon: Cephus Shelling, MD;  [facility];  Service: Cardiovascular;;   PERIPHERAL VASCULAR INTERVENTION  08/06/2022   Procedure: PERIPHERAL VASCULAR  INTERVENTION;  Surgeon: Cephus Shelling, MD;  Location: [facility] INVASIVE CV LAB;  Service: Cardiovascular;;   PERIPHERAL VASCULAR THROMBECTOMY  08/02/2022   Procedure: PERIPHERAL VASCULAR THROMBECTOMY;  Surgeon: Cephus Shelling, MD;  [facility];  Service: Cardiovascular;;   RIGHT/LEFT HEART CATH AND CORONARY ANGIOGRAPHY N/A 08/23/2022   Procedure: RIGHT/LEFT HEART CATH AND CORONARY ANGIOGRAPHY;  Surgeon: [name], MD;  Location: [facility] INVASIVE CV LAB;  Service: Cardiovascular;  Laterality: N/A;   SKIN SPLIT GRAFT Right 03/18/2020   Procedure: SKIN GRAFTING RIGHT FOOT ULCER;  Surgeon: Nadara Mustard, MD;  Location: [facility] OR;  Service: Orthopedics;  Laterality: Right;   STUMP REVISION Right 11/30/2022   Procedure: REVISION RIGHT TRANSMETATARSAL AMPUTATION;  Surgeon: Nadara Mustard, MD;  Location: [facility] OR;  Service: Orthopedics;  Laterality: Right;   TEE WITHOUT CARDIOVERSION N/A 08/27/2022   Procedure: TRANSESOPHAGEAL ECHOCARDIOGRAM;  Surgeon: [name], MD;  Location: [facility] INVASIVE CV LAB;  Service: Cardiovascular;  Laterality: N/A;   WISDOM TOOTH EXTRACTION     Social History   Occupational History   Not on file   Tobacco Use   Smoking status: Former    Current packs/day: 0.00    Types: Cigarettes    Quit date: 01/2017    Years since quitting: 5.9   Smokeless tobacco: Never  Vaping Use   Vaping status: Never Used  Substance and Sexual Activity   Alcohol use: Not Currently   Drug use: Yes    Types: Marijuana    Comment: on ocassion- Last time - 11/13/22   Sexual activity: Yes    Birth control/protection: Post-menopausal

## 2023-01-15 ENCOUNTER — Other Ambulatory Visit: Payer: Self-pay

## 2023-01-15 DIAGNOSIS — I739 Peripheral vascular disease, unspecified: Secondary | ICD-10-CM

## 2023-01-17 ENCOUNTER — Ambulatory Visit: Payer: Managed Care, Other (non HMO) | Admitting: Orthopedic Surgery

## 2023-01-17 ENCOUNTER — Encounter: Payer: Self-pay | Admitting: Orthopedic Surgery

## 2023-01-17 DIAGNOSIS — T8781 Dehiscence of amputation stump: Secondary | ICD-10-CM

## 2023-01-17 DIAGNOSIS — Z89439 Acquired absence of unspecified foot: Secondary | ICD-10-CM

## 2023-01-17 NOTE — Progress Notes (Signed)
Office Visit Note   Patient: Kayla Schmitt           Date of Birth: [birth year]           MRN: [id number] Visit Date: 01/17/2023              Requested by: Medicine, [facility] Internal [address] [place],  [place] [address] PCP: Medicine, [facility] Internal  Chief Complaint  Patient presents with   Right Foot - Routine Post Op    11/30/2022 revision right TMA       HPI: Patient is a 57 year old woman who is 6 weeks status post revision right transmetatarsal amputation.  She is currently at work with seated work and dry dressing changes daily.  She has a follow-up appointment with vascular vein surgery.  Assessment & Plan: Visit Diagnoses:  1. Dehiscence of amputation stump of right lower extremity (HCC)   2. History of transmetatarsal amputation of foot (HCC)     Plan: The lateral wound was debrided there was good petechial bleeding.  Dry dressing was applied she will continue with routine wound care.  Follow-Up Instructions: Return in about 2 weeks (around 01/31/2023).   Ortho Exam  Patient is alert, oriented, no adenopathy, well-dressed, normal affect, normal respiratory effort. Examination the medial wound is healing nicely laterally there is wound dehiscence with fibrinous exudative tissue plantarly with good granulation tissue dorsally.  A 10 blade knife was used to debride the plantar skin flap back to healthy bleeding viable tissue this was touched with silver nitrate a compression dressing was applied.  Imaging: No results found.   Labs: Lab Results  Component Value Date   HGBA1C 6.4 (H) 08/02/2022   HGBA1C 11.0 (H) 01/11/2020   ESRSEDRATE 136 (H) 01/14/2020   ESRSEDRATE 126 (H) 01/13/2020   ESRSEDRATE 125 (H) 01/12/2020   CRP 11.3 (H) 01/14/2020   CRP 13.0 (H) 01/13/2020   CRP 15.8 (H) 01/12/2020   LABURIC 7.7 (H) 02/04/2022   REPTSTATUS 08/07/2022 FINAL 08/02/2022   CULT  08/02/2022    NO GROWTH 5 DAYS Performed at [facility], [address]  [place], [place] [address]      Lab Results  Component Value Date   ALBUMIN 1.6 (L) 08/06/2022   ALBUMIN 1.7 (L) 08/05/2022   ALBUMIN 1.9 (L) 08/04/2022    Lab Results  Component Value Date   MG 1.9 08/25/2022   MG 1.7 08/24/2022   MG 1.7 08/23/2022   No results found for: "VD25OH"  No results found for: "PREALBUMIN"    Latest Ref Rng & Units 11/30/2022    5:53 AM 08/25/2022    2:35 AM 08/24/2022    3:09 AM  CBC EXTENDED  WBC 4.0 - 10.5 K/uL 7.7  4.9  4.6   RBC 3.87 - 5.11 MIL/uL 3.03  2.83  2.74   Hemoglobin 12.0 - 15.0 g/dL 8.9  7.8  7.7   HCT 55.7 - 46.0 % 27.1  25.9  24.4   Platelets 150 - 400 K/uL 208  327  298      There is no height or weight on file to calculate BMI.  Orders:  No orders of the defined types were placed in this encounter.  No orders of the defined types were placed in this encounter.    Procedures: No procedures performed  Clinical Data: No additional findings.  ROS:  All other systems negative, except as noted in the HPI. Review of Systems  Objective: Vital Signs: There were  no vitals taken for this visit.  Specialty Comments:  No specialty comments available.  PMFS History: Patient Active Problem List   Diagnosis Date Noted   Osteomyelitis of foot, right, acute (HCC) 12/12/2022   CAD (coronary artery disease) 11/06/2022   Aortic stenosis 11/06/2022   Mitral regurgitation 08/27/2022   NSTEMI (non-ST elevated myocardial infarction) (HCC) 08/18/2022   Acute clinical systolic heart failure (HCC) 08/18/2022   Acute blood loss anemia 08/18/2022   Anemia 08/18/2022   Critical limb ischemia of both lower extremities (HCC) 06/19/2022   DM type 2 (diabetes mellitus, type 2) (HCC) 01/28/2020   Hyperlipidemia 01/28/2020   Hypertension 01/28/2020   Hypothyroidism 01/28/2020   Obesity 01/28/2020   Ulcerated, foot, right, limited to breakdown of skin (HCC)    Cellulitis of right foot    Peripheral arterial disease (HCC)    Cellulitis  01/11/2020   Blister of leg 01/06/2020   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/28/2017   Chest pain 10/28/2017   GERD (gastroesophageal reflux disease) 10/28/2017   Past Medical History:  Diagnosis Date   Anemia    Aortic stenosis    Arthritis    CHF (congestive heart failure) (HCC)    Coronary artery disease    Diabetes mellitus without complication (HCC)    type 2   GERD (gastroesophageal reflux disease)    Heart murmur    History of blood transfusion    Hypercholesteremia    Hypertension    Hypothyroidism    Mitral regurgitation    Myocardial infarction (HCC)    Peripheral vascular disease (HCC)    PONV (postoperative nausea and vomiting)    Thyroid disease     Family History  Problem Relation Age of Onset   Diabetes Other    CAD Other     Past Surgical History:  Procedure Laterality Date   ABDOMINAL AORTOGRAM W/LOWER EXTREMITY Bilateral 01/14/2020   Procedure: ABDOMINAL AORTOGRAM W/LOWER EXTREMITY;  Surgeon: Cephus Shelling, MD;  [facility];  Service: Cardiovascular;  Laterality: Bilateral;   ABDOMINAL AORTOGRAM W/LOWER EXTREMITY N/A 07/05/2022   Procedure: ABDOMINAL AORTOGRAM W/LOWER EXTREMITY;  Surgeon: Cephus Shelling, MD;  [facility]: [facility];  Service: Cardiovascular;  Laterality: N/A;   ABDOMINAL AORTOGRAM W/LOWER EXTREMITY N/A 08/02/2022   Procedure: ABDOMINAL AORTOGRAM W/LOWER EXTREMITY;  Surgeon: Cephus Shelling, MD;  [facility]: [facility];  Service: Cardiovascular;  Laterality: N/A;   ABDOMINAL AORTOGRAM W/LOWER EXTREMITY N/A 08/06/2022   Procedure: ABDOMINAL AORTOGRAM W/LOWER EXTREMITY;  Surgeon: Cephus Shelling, MD;  [facility];  Service: Cardiovascular;  Laterality: N/A;   AMPUTATION Bilateral 07/20/2022   Procedure: AMPUTATION RIGHT GREAT TOE AND SECOND TOE, AMPUTATION LEFT GREAT TOE;  Surgeon: Nadara Mustard, MD;  Location: [facility] OR;  Service: Orthopedics;  Laterality: Bilateral;    AMPUTATION Bilateral 08/08/2022   Procedure: BILATERAL TRANSMETATARSAL AMPUTATION;  Surgeon: Nadara Mustard, MD;  Location: [facility] OR;  Service: Orthopedics;  Laterality: Bilateral;   AMPUTATION TOE     PERIPHERAL VASCULAR ATHERECTOMY Right 01/14/2020   Procedure: PERIPHERAL VASCULAR ATHERECTOMY;  Surgeon: Cephus Shelling, MD;  Location: [facility] INVASIVE CV LAB;  Service: Cardiovascular;  Laterality: Right;  sfa   PERIPHERAL VASCULAR INTERVENTION Right 01/14/2020   Procedure: PERIPHERAL VASCULAR INTERVENTION;  Surgeon: Cephus Shelling, MD;  [facility];  Service: Cardiovascular;  Laterality: Right;  sfa stent    PERIPHERAL VASCULAR INTERVENTION  07/05/2022   Procedure: PERIPHERAL VASCULAR INTERVENTION;  Surgeon: Cephus Shelling, MD;  Location: [facility] INVASIVE CV LAB;  Service: Cardiovascular;;   PERIPHERAL VASCULAR INTERVENTION  08/02/2022   Procedure: PERIPHERAL VASCULAR INTERVENTION;  Surgeon: Cephus Shelling, MD;  [facility];  Service: Cardiovascular;;   PERIPHERAL VASCULAR INTERVENTION  08/06/2022   Procedure: PERIPHERAL VASCULAR INTERVENTION;  Surgeon: Cephus Shelling, MD;  Location: [facility] INVASIVE CV LAB;  Service: Cardiovascular;;   PERIPHERAL VASCULAR THROMBECTOMY  08/02/2022   Procedure: PERIPHERAL VASCULAR THROMBECTOMY;  Surgeon: Cephus Shelling, MD;  [facility];  Service: Cardiovascular;;   RIGHT/LEFT HEART CATH AND CORONARY ANGIOGRAPHY N/A 08/23/2022   Procedure: RIGHT/LEFT HEART CATH AND CORONARY ANGIOGRAPHY;  Surgeon: [name], MD;  Location: [facility] INVASIVE CV LAB;  Service: Cardiovascular;  Laterality: N/A;   SKIN SPLIT GRAFT Right 03/18/2020   Procedure: SKIN GRAFTING RIGHT FOOT ULCER;  Surgeon: Nadara Mustard, MD;  Location: [facility] OR;  Service: Orthopedics;  Laterality: Right;   STUMP REVISION Right 11/30/2022   Procedure: REVISION RIGHT TRANSMETATARSAL AMPUTATION;  Surgeon: Nadara Mustard, MD;  Location: [facility] OR;  Service:  Orthopedics;  Laterality: Right;   TEE WITHOUT CARDIOVERSION N/A 08/27/2022   Procedure: TRANSESOPHAGEAL ECHOCARDIOGRAM;  Surgeon: [name], MD;  Location: [facility] INVASIVE CV LAB;  Service: Cardiovascular;  Laterality: N/A;   WISDOM TOOTH EXTRACTION     Social History   Occupational History   Not on file  Tobacco Use   Smoking status: Former    Current packs/day: 0.00    Types: Cigarettes    Quit date: 01/2017    Years since quitting: 5.9   Smokeless tobacco: Never  Vaping Use   Vaping status: Never Used  Substance and Sexual Activity   Alcohol use: Not Currently   Drug use: Yes    Types: Marijuana    Comment: on ocassion- Last time - 11/13/22   Sexual activity: Yes    Birth control/protection: Post-menopausal

## 2023-01-27 ENCOUNTER — Other Ambulatory Visit: Payer: Self-pay | Admitting: Vascular Surgery

## 2023-01-29 ENCOUNTER — Ambulatory Visit (HOSPITAL_COMMUNITY): Payer: Managed Care, Other (non HMO)

## 2023-01-29 ENCOUNTER — Ambulatory Visit: Payer: Managed Care, Other (non HMO) | Admitting: Vascular Surgery

## 2023-01-29 ENCOUNTER — Ambulatory Visit: Payer: Managed Care, Other (non HMO) | Admitting: Orthopedic Surgery

## 2023-01-29 DIAGNOSIS — T8781 Dehiscence of amputation stump: Secondary | ICD-10-CM

## 2023-01-29 DIAGNOSIS — Z89431 Acquired absence of right foot: Secondary | ICD-10-CM

## 2023-01-29 DIAGNOSIS — Z89439 Acquired absence of unspecified foot: Secondary | ICD-10-CM

## 2023-01-29 MED ORDER — SULFAMETHOXAZOLE-TRIMETHOPRIM 800-160 MG PO TABS: 1.0000 | ORAL_TABLET | Freq: Two times a day (BID) | ORAL | 0 refills | Status: DC

## 2023-01-30 ENCOUNTER — Encounter: Payer: Self-pay | Admitting: Orthopedic Surgery

## 2023-01-30 NOTE — Progress Notes (Signed)
Office Visit Note   Patient: Kayla Schmitt           Date of Birth: 20-Dec-1965           MRN: [id number] Visit Date: 01/29/2023              Requested by: Medicine, [facility] Internal [address] [place],  [place] [address] PCP: Medicine, [facility] Internal  Chief Complaint  Patient presents with   Right Foot - Routine Post Op    11/30/2022 revision right TMA       HPI: Patient is 4 weeks status post revision right transmetatarsal amputation.  Patient states she has had episodes of elevated temperature she has had some bleeding laterally.  Assessment & Plan: Visit Diagnoses:  1. Dehiscence of amputation stump of right lower extremity (HCC)   2. History of transmetatarsal amputation of foot (HCC)     Plan: Patient would like to continue with conservative treatment.  A prescription for Bactrim DS was called in.  She will continue with Dial soap cleansing dry dressing change minimize weightbearing  Follow-Up Instructions: Return in about 1 week (around 02/05/2023).   Ortho Exam  Patient is alert, oriented, no adenopathy, well-dressed, normal affect, normal respiratory effort. Examination the foot has no cellulitis.  The wound laterally probes 1 cm deep.  There is bleeding granulation tissue without ischemic changes.  There is no purulent drainage.  Imaging: No results found. No images are attached to the encounter.  Labs: Lab Results  Component Value Date   HGBA1C 6.4 (H) 08/02/2022   HGBA1C 11.0 (H) 01/11/2020   ESRSEDRATE 136 (H) 01/14/2020   ESRSEDRATE 126 (H) 01/13/2020   ESRSEDRATE 125 (H) 01/12/2020   CRP 11.3 (H) 01/14/2020   CRP 13.0 (H) 01/13/2020   CRP 15.8 (H) 01/12/2020   LABURIC 7.7 (H) 02/04/2022   REPTSTATUS 08/07/2022 FINAL 08/02/2022   CULT  08/02/2022    NO GROWTH 5 DAYS Performed at [facility], [address] [place], [place] [address]      Lab Results  Component Value Date   ALBUMIN 1.6 (L) 08/06/2022   ALBUMIN 1.7 (L) 08/05/2022    ALBUMIN 1.9 (L) 08/04/2022    Lab Results  Component Value Date   MG 1.9 08/25/2022   MG 1.7 08/24/2022   MG 1.7 08/23/2022   No results found for: "VD25OH"  No results found for: "PREALBUMIN"    Latest Ref Rng & Units 11/30/2022    5:53 AM 08/25/2022    2:35 AM 08/24/2022    3:09 AM  CBC EXTENDED  WBC 4.0 - 10.5 K/uL 7.7  4.9  4.6   RBC 3.87 - 5.11 MIL/uL 3.03  2.83  2.74   Hemoglobin 12.0 - 15.0 g/dL 8.9  7.8  7.7   HCT 69.6 - 46.0 % 27.1  25.9  24.4   Platelets 150 - 400 K/uL 208  327  298      There is no height or weight on file to calculate BMI.  Orders:  No orders of the defined types were placed in this encounter.  Meds ordered this encounter  Medications   sulfamethoxazole-trimethoprim (BACTRIM DS) 800-160 MG tablet    Sig: Take 1 tablet by mouth 2 (two) times daily.    Dispense:  30 tablet    Refill:  0     Procedures: No procedures performed  Clinical Data: No additional findings.  ROS:  All other systems negative, except as noted in the HPI. Review  of Systems  Objective: Vital Signs: There were no vitals taken for this visit.  Specialty Comments:  No specialty comments available.  PMFS History: Patient Active Problem List   Diagnosis Date Noted   Osteomyelitis of foot, right, acute (HCC) 12/12/2022   CAD (coronary artery disease) 11/06/2022   Aortic stenosis 11/06/2022   Mitral regurgitation 08/27/2022   NSTEMI (non-ST elevated myocardial infarction) (HCC) 08/18/2022   Acute clinical systolic heart failure (HCC) 08/18/2022   Acute blood loss anemia 08/18/2022   Anemia 08/18/2022   Critical limb ischemia of both lower extremities (HCC) 06/19/2022   DM type 2 (diabetes mellitus, type 2) (HCC) 01/28/2020   Hyperlipidemia 01/28/2020   Hypertension 01/28/2020   Hypothyroidism 01/28/2020   Obesity 01/28/2020   Ulcerated, foot, right, limited to breakdown of skin (HCC)    Cellulitis of right foot    Peripheral arterial disease (HCC)     Cellulitis 01/11/2020   Blister of leg 01/06/2020   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/28/2017   Chest pain 10/28/2017   GERD (gastroesophageal reflux disease) 10/28/2017   Past Medical History:  Diagnosis Date   Anemia    Aortic stenosis    Arthritis    CHF (congestive heart failure) (HCC)    Coronary artery disease    Diabetes mellitus without complication (HCC)    type 2   GERD (gastroesophageal reflux disease)    Heart murmur    History of blood transfusion    Hypercholesteremia    Hypertension    Hypothyroidism    Mitral regurgitation    Myocardial infarction (HCC)    Peripheral vascular disease (HCC)    PONV (postoperative nausea and vomiting)    Thyroid disease     Family History  Problem Relation Age of Onset   Diabetes Other    CAD Other     Past Surgical History:  Procedure Laterality Date   ABDOMINAL AORTOGRAM W/LOWER EXTREMITY Bilateral 01/14/2020   Procedure: ABDOMINAL AORTOGRAM W/LOWER EXTREMITY;  Surgeon: Cephus Shelling, MD;  [facility]: [facility];  Service: Cardiovascular;  Laterality: Bilateral;   ABDOMINAL AORTOGRAM W/LOWER EXTREMITY N/A 07/05/2022   Procedure: ABDOMINAL AORTOGRAM W/LOWER EXTREMITY;  Surgeon: Cephus Shelling, MD;  [facility]: [facility];  Service: Cardiovascular;  Laterality: N/A;   ABDOMINAL AORTOGRAM W/LOWER EXTREMITY N/A 08/02/2022   Procedure: ABDOMINAL AORTOGRAM W/LOWER EXTREMITY;  Surgeon: Cephus Shelling, MD;  Location: [facility];  Service: Cardiovascular;  Laterality: N/A;   ABDOMINAL AORTOGRAM W/LOWER EXTREMITY N/A 08/06/2022   Procedure: ABDOMINAL AORTOGRAM W/LOWER EXTREMITY;  Surgeon: Cephus Shelling, MD;  [facility];  Service: Cardiovascular;  Laterality: N/A;   AMPUTATION Bilateral 07/20/2022   Procedure: AMPUTATION RIGHT GREAT TOE AND SECOND TOE, AMPUTATION LEFT GREAT TOE;  Surgeon: Nadara Mustard, MD;  Location: [facility] OR;  Service: Orthopedics;  Laterality: Bilateral;    AMPUTATION Bilateral 08/08/2022   Procedure: BILATERAL TRANSMETATARSAL AMPUTATION;  Surgeon: Nadara Mustard, MD;  Location: [facility] OR;  Service: Orthopedics;  Laterality: Bilateral;   AMPUTATION TOE     PERIPHERAL VASCULAR ATHERECTOMY Right 01/14/2020   Procedure: PERIPHERAL VASCULAR ATHERECTOMY;  Surgeon: Cephus Shelling, MD;  Location: [facility] INVASIVE CV LAB;  Service: Cardiovascular;  Laterality: Right;  sfa   PERIPHERAL VASCULAR INTERVENTION Right 01/14/2020   Procedure: PERIPHERAL VASCULAR INTERVENTION;  Surgeon: Cephus Shelling, MD;  [facility];  Service: Cardiovascular;  Laterality: Right;  sfa stent    PERIPHERAL VASCULAR INTERVENTION  07/05/2022   Procedure: PERIPHERAL VASCULAR INTERVENTION;  Surgeon: Cephus Shelling, MD;  Location: [facility] INVASIVE CV LAB;  Service: Cardiovascular;;   PERIPHERAL VASCULAR INTERVENTION  08/02/2022   Procedure: PERIPHERAL VASCULAR INTERVENTION;  Surgeon: Cephus Shelling, MD;  [facility];  Service: Cardiovascular;;   PERIPHERAL VASCULAR INTERVENTION  08/06/2022   Procedure: PERIPHERAL VASCULAR INTERVENTION;  Surgeon: Cephus Shelling, MD;  Location: [facility] INVASIVE CV LAB;  Service: Cardiovascular;;   PERIPHERAL VASCULAR THROMBECTOMY  08/02/2022   Procedure: PERIPHERAL VASCULAR THROMBECTOMY;  Surgeon: Cephus Shelling, MD;  [facility];  Service: Cardiovascular;;   RIGHT/LEFT HEART CATH AND CORONARY ANGIOGRAPHY N/A 08/23/2022   Procedure: RIGHT/LEFT HEART CATH AND CORONARY ANGIOGRAPHY;  Surgeon: [name], MD;  Location: [facility] INVASIVE CV LAB;  Service: Cardiovascular;  Laterality: N/A;   SKIN SPLIT GRAFT Right 03/18/2020   Procedure: SKIN GRAFTING RIGHT FOOT ULCER;  Surgeon: Nadara Mustard, MD;  Location: [facility] OR;  Service: Orthopedics;  Laterality: Right;   STUMP REVISION Right 11/30/2022   Procedure: REVISION RIGHT TRANSMETATARSAL AMPUTATION;  Surgeon: Nadara Mustard, MD;  Location: [facility] OR;   Service: Orthopedics;  Laterality: Right;   TEE WITHOUT CARDIOVERSION N/A 08/27/2022   Procedure: TRANSESOPHAGEAL ECHOCARDIOGRAM;  Surgeon: [name], MD;  Location: [facility] INVASIVE CV LAB;  Service: Cardiovascular;  Laterality: N/A;   WISDOM TOOTH EXTRACTION     Social History   Occupational History   Not on file  Tobacco Use   Smoking status: Former    Current packs/day: 0.00    Types: Cigarettes    Quit date: 01/2017    Years since quitting: 6.0   Smokeless tobacco: Never  Vaping Use   Vaping status: Never Used  Substance and Sexual Activity   Alcohol use: Not Currently   Drug use: Yes    Types: Marijuana    Comment: on ocassion- Last time - 11/13/22   Sexual activity: Yes    Birth control/protection: Post-menopausal

## 2023-02-04 ENCOUNTER — Encounter: Payer: Self-pay | Admitting: Orthopedic Surgery

## 2023-02-04 ENCOUNTER — Ambulatory Visit (INDEPENDENT_AMBULATORY_CARE_PROVIDER_SITE_OTHER): Payer: Managed Care, Other (non HMO) | Admitting: Orthopedic Surgery

## 2023-02-04 DIAGNOSIS — T8781 Dehiscence of amputation stump: Secondary | ICD-10-CM

## 2023-02-04 DIAGNOSIS — Z89439 Acquired absence of unspecified foot: Secondary | ICD-10-CM

## 2023-02-05 ENCOUNTER — Encounter: Payer: Self-pay | Admitting: Orthopedic Surgery

## 2023-02-05 ENCOUNTER — Ambulatory Visit: Payer: Managed Care, Other (non HMO) | Admitting: Vascular Surgery

## 2023-02-05 NOTE — Progress Notes (Signed)
Office Visit Note   Patient: Kayla Schmitt           Date of Birth: 31-Aug-1965           MRN: [id number] Visit Date: 02/04/2023              Requested by: Medicine, [facility] Internal [address] [place],  [place] [address] PCP: Medicine, [facility] Internal  Chief Complaint  Patient presents with   Right Foot - Follow-up    Revision of TMA 11/30/2022      HPI: 39-month status post revision right transmetatarsal amputation.  She is on Bactrim DS and feels like her foot is getting better.  Assessment & Plan: Visit Diagnoses:  1. Dehiscence of amputation stump of right lower extremity (HCC)   2. History of transmetatarsal amputation of foot (HCC)     Plan: Patient will continue with wound care continue with pressure offloading.  Follow-Up Instructions: Return in about 2 weeks (around 02/18/2023).   Ortho Exam  Patient is alert, oriented, no adenopathy, well-dressed, normal affect, normal respiratory effort. Examination the wound bed has healthy granulation tissue the wound dehiscence measures 1 x 5 cm.  It does tunnel to bone laterally over the fifth metatarsal.  There is no cellulitis odor or drainage.  Discussed recommendation to proceed with surgical intervention.  Patient states she wants to continue with conservative wound care therapy.  Imaging: No results found. No images are attached to the encounter.  Labs: Lab Results  Component Value Date   HGBA1C 6.4 (H) 08/02/2022   HGBA1C 11.0 (H) 01/11/2020   ESRSEDRATE 136 (H) 01/14/2020   ESRSEDRATE 126 (H) 01/13/2020   ESRSEDRATE 125 (H) 01/12/2020   CRP 11.3 (H) 01/14/2020   CRP 13.0 (H) 01/13/2020   CRP 15.8 (H) 01/12/2020   LABURIC 7.7 (H) 02/04/2022   REPTSTATUS 08/07/2022 FINAL 08/02/2022   CULT  08/02/2022    NO GROWTH 5 DAYS Performed at [facility], [address] [place], [place] [address]      Lab Results  Component Value Date   ALBUMIN 1.6 (L) 08/06/2022   ALBUMIN 1.7 (L) 08/05/2022   ALBUMIN  1.9 (L) 08/04/2022    Lab Results  Component Value Date   MG 1.9 08/25/2022   MG 1.7 08/24/2022   MG 1.7 08/23/2022   No results found for: "VD25OH"  No results found for: "PREALBUMIN"    Latest Ref Rng & Units 11/30/2022    5:53 AM 08/25/2022    2:35 AM 08/24/2022    3:09 AM  CBC EXTENDED  WBC 4.0 - 10.5 K/uL 7.7  4.9  4.6   RBC 3.87 - 5.11 MIL/uL 3.03  2.83  2.74   Hemoglobin 12.0 - 15.0 g/dL 8.9  7.8  7.7   HCT 74.2 - 46.0 % 27.1  25.9  24.4   Platelets 150 - 400 K/uL 208  327  298      There is no height or weight on file to calculate BMI.  Orders:  No orders of the defined types were placed in this encounter.  No orders of the defined types were placed in this encounter.    Procedures: No procedures performed  Clinical Data: No additional findings.  ROS:  All other systems negative, except as noted in the HPI. Review of Systems  Objective: Vital Signs: There were no vitals taken for this visit.  Specialty Comments:  No specialty comments available.  PMFS History: Patient Active Problem List   Diagnosis  Date Noted   Osteomyelitis of foot, right, acute (HCC) 12/12/2022   CAD (coronary artery disease) 11/06/2022   Aortic stenosis 11/06/2022   Mitral regurgitation 08/27/2022   NSTEMI (non-ST elevated myocardial infarction) (HCC) 08/18/2022   Acute clinical systolic heart failure (HCC) 08/18/2022   Acute blood loss anemia 08/18/2022   Anemia 08/18/2022   Critical limb ischemia of both lower extremities (HCC) 06/19/2022   DM type 2 (diabetes mellitus, type 2) (HCC) 01/28/2020   Hyperlipidemia 01/28/2020   Hypertension 01/28/2020   Hypothyroidism 01/28/2020   Obesity 01/28/2020   Ulcerated, foot, right, limited to breakdown of skin (HCC)    Cellulitis of right foot    Peripheral arterial disease (HCC)    Cellulitis 01/11/2020   Blister of leg 01/06/2020   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/28/2017   Chest pain 10/28/2017   GERD  (gastroesophageal reflux disease) 10/28/2017   Past Medical History:  Diagnosis Date   Anemia    Aortic stenosis    Arthritis    CHF (congestive heart failure) (HCC)    Coronary artery disease    Diabetes mellitus without complication (HCC)    type 2   GERD (gastroesophageal reflux disease)    Heart murmur    History of blood transfusion    Hypercholesteremia    Hypertension    Hypothyroidism    Mitral regurgitation    Myocardial infarction (HCC)    Peripheral vascular disease (HCC)    PONV (postoperative nausea and vomiting)    Thyroid disease     Family History  Problem Relation Age of Onset   Diabetes Other    CAD Other     Past Surgical History:  Procedure Laterality Date   ABDOMINAL AORTOGRAM W/LOWER EXTREMITY Bilateral 01/14/2020   Procedure: ABDOMINAL AORTOGRAM W/LOWER EXTREMITY;  Surgeon: Cephus Shelling, MD;  [facility]: [facility];  Service: Cardiovascular;  Laterality: Bilateral;   ABDOMINAL AORTOGRAM W/LOWER EXTREMITY N/A 07/05/2022   Procedure: ABDOMINAL AORTOGRAM W/LOWER EXTREMITY;  Surgeon: Cephus Shelling, MD;  Location: [facility];  Service: Cardiovascular;  Laterality: N/A;   ABDOMINAL AORTOGRAM W/LOWER EXTREMITY N/A 08/02/2022   Procedure: ABDOMINAL AORTOGRAM W/LOWER EXTREMITY;  Surgeon: Cephus Shelling, MD;  [facility]: [facility];  Service: Cardiovascular;  Laterality: N/A;   ABDOMINAL AORTOGRAM W/LOWER EXTREMITY N/A 08/06/2022   Procedure: ABDOMINAL AORTOGRAM W/LOWER EXTREMITY;  Surgeon: Cephus Shelling, MD;  [facility]: [facility];  Service: Cardiovascular;  Laterality: N/A;   AMPUTATION Bilateral 07/20/2022   Procedure: AMPUTATION RIGHT GREAT TOE AND SECOND TOE, AMPUTATION LEFT GREAT TOE;  Surgeon: Nadara Mustard, MD;  Location: [facility] OR;  Service: Orthopedics;  Laterality: Bilateral;   AMPUTATION Bilateral 08/08/2022   Procedure: BILATERAL TRANSMETATARSAL AMPUTATION;  Surgeon: Nadara Mustard, MD;  Location: [facility] OR;   Service: Orthopedics;  Laterality: Bilateral;   AMPUTATION TOE     PERIPHERAL VASCULAR ATHERECTOMY Right 01/14/2020   Procedure: PERIPHERAL VASCULAR ATHERECTOMY;  Surgeon: Cephus Shelling, MD;  Location[facility]VAS[facility] CV LAB;  Service: Cardiovascular;  Laterality: Right;  sfa   PERIPHERAL VASCULAR INTERVENTION Right 01/14/2020   Procedure: PERIPHERAL VASCULAR INTERVENTION;  Surgeon: Cephus Shelling, MD;  [facility];  Service: Cardiovascular;  Laterality: Right;  sfa stent    PERIPHERAL VASCULAR INTERVENTION  07/05/2022   Procedure: PERIPHERAL VASCULAR INTERVENTION;  Surgeon: Cephus Shelling, MD;  [facility];  Service: Cardiovascular;;   PERIPHERAL VASCULAR INTERVENTION  08/02/2022   Procedure:  PERIPHERAL VASCULAR INTERVENTION;  Surgeon: Cephus Shelling, MD;  Location: [facility] INVASIVE CV LAB;  Service: Cardiovascular;;   PERIPHERAL VASCULAR INTERVENTION  08/06/2022   Procedure: PERIPHERAL VASCULAR INTERVENTION;  Surgeon: Cephus Shelling, MD;  Location: [facility] INVASIVE CV LAB;  Service: Cardiovascular;;   PERIPHERAL VASCULAR THROMBECTOMY  08/02/2022   Procedure: PERIPHERAL VASCULAR THROMBECTOMY;  Surgeon: Cephus Shelling, MD;  [facility];  Service: Cardiovascular;;   RIGHT/LEFT HEART CATH AND CORONARY ANGIOGRAPHY N/A 08/23/2022   Procedure: RIGHT/LEFT HEART CATH AND CORONARY ANGIOGRAPHY;  Surgeon: [name], MD;  Location: [facility] INVASIVE CV LAB;  Service: Cardiovascular;  Laterality: N/A;   SKIN SPLIT GRAFT Right 03/18/2020   Procedure: SKIN GRAFTING RIGHT FOOT ULCER;  Surgeon: Nadara Mustard, MD;  Location: [facility] OR;  Service: Orthopedics;  Laterality: Right;   STUMP REVISION Right 11/30/2022   Procedure: REVISION RIGHT TRANSMETATARSAL AMPUTATION;  Surgeon: Nadara Mustard, MD;  Location: [facility] OR;  Service: Orthopedics;  Laterality: Right;   TEE WITHOUT CARDIOVERSION N/A 08/27/2022   Procedure: TRANSESOPHAGEAL ECHOCARDIOGRAM;  Surgeon:  [name], MD;  Location: [facility] INVASIVE CV LAB;  Service: Cardiovascular;  Laterality: N/A;   WISDOM TOOTH EXTRACTION     Social History   Occupational History   Not on file  Tobacco Use   Smoking status: Former    Current packs/day: 0.00    Types: Cigarettes    Quit date: 01/2017    Years since quitting: 6.0   Smokeless tobacco: Never  Vaping Use   Vaping status: Never Used  Substance and Sexual Activity   Alcohol use: Not Currently   Drug use: Yes    Types: Marijuana    Comment: on ocassion- Last time - 11/13/22   Sexual activity: Yes    Birth control/protection: Post-menopausal

## 2023-02-18 ENCOUNTER — Ambulatory Visit (INDEPENDENT_AMBULATORY_CARE_PROVIDER_SITE_OTHER): Payer: Managed Care, Other (non HMO) | Admitting: Orthopedic Surgery

## 2023-02-18 DIAGNOSIS — T8781 Dehiscence of amputation stump: Secondary | ICD-10-CM

## 2023-02-18 DIAGNOSIS — Z89439 Acquired absence of unspecified foot: Secondary | ICD-10-CM

## 2023-02-19 ENCOUNTER — Encounter: Payer: Self-pay | Admitting: Orthopedic Surgery

## 2023-02-19 NOTE — Progress Notes (Signed)
Office Visit Note   Patient: Kayla Schmitt           Date of Birth: [birth year]           MRN: [id number] Visit Date: 02/18/2023              Requested by: Medicine, [facility] Internal [address] [place],  [place] [address] PCP: Medicine, [facility] Internal  Chief Complaint  Patient presents with   Right Foot - Follow-up    Revision of TMA 11/30/2022      HPI: Patient is a 57 year old woman who is almost 3 months status post revision right transmetatarsal amputation.  She states the wound is closing nicely but noticed some firm mass laterally.  Assessment & Plan: Visit Diagnoses:  1. History of transmetatarsal amputation of foot (HCC)   2. Dehiscence of amputation stump of right lower extremity (HCC)     Plan: Bony fragments were removed dry dressing applied continue with pressure offloading Dial soap cleansing and dry dressing.  Follow-Up Instructions: Return in about 2 weeks (around 03/04/2023).   Ortho Exam  Patient is alert, oriented, no adenopathy, well-dressed, normal affect, normal respiratory effort. Examination the wound bed has healthy bleeding granulation tissue.  A few small fragments of bone chip are palpated laterally and these are removed with a rondure without complication.  There is good petechial bleeding.  Sterile compressive dressing applied.  Imaging: No results found. No images are attached to the encounter.  Labs: Lab Results  Component Value Date   HGBA1C 6.4 (H) 08/02/2022   HGBA1C 11.0 (H) 01/11/2020   ESRSEDRATE 136 (H) 01/14/2020   ESRSEDRATE 126 (H) 01/13/2020   ESRSEDRATE 125 (H) 01/12/2020   CRP 11.3 (H) 01/14/2020   CRP 13.0 (H) 01/13/2020   CRP 15.8 (H) 01/12/2020   LABURIC 7.7 (H) 02/04/2022   REPTSTATUS 08/07/2022 FINAL 08/02/2022   CULT  08/02/2022    NO GROWTH 5 DAYS Performed at [facility], [address], [place], [place] [address]      Lab Results  Component Value Date   ALBUMIN 1.6 (L) 08/06/2022   ALBUMIN 1.7 (L)  08/05/2022   ALBUMIN 1.9 (L) 08/04/2022    Lab Results  Component Value Date   MG 1.9 08/25/2022   MG 1.7 08/24/2022   MG 1.7 08/23/2022   No results found for: "VD25OH"  No results found for: "PREALBUMIN"    Latest Ref Rng & Units 11/30/2022    5:53 AM 08/25/2022    2:35 AM 08/24/2022    3:09 AM  CBC EXTENDED  WBC 4.0 - 10.5 K/uL 7.7  4.9  4.6   RBC 3.87 - 5.11 MIL/uL 3.03  2.83  2.74   Hemoglobin 12.0 - 15.0 g/dL 8.9  7.8  7.7   HCT 53.6 - 46.0 % 27.1  25.9  24.4   Platelets 150 - 400 K/uL 208  327  298      There is no height or weight on file to calculate BMI.  Orders:  No orders of the defined types were placed in this encounter.  No orders of the defined types were placed in this encounter.    Procedures: No procedures performed  Clinical Data: No additional findings.  ROS:  All other systems negative, except as noted in the HPI. Review of Systems  Objective: Vital Signs: There were no vitals taken for this visit.  Specialty Comments:  No specialty comments available.  PMFS History: Patient Active Problem List  Diagnosis Date Noted   Osteomyelitis of foot, right, acute (HCC) 12/12/2022   CAD (coronary artery disease) 11/06/2022   Aortic stenosis 11/06/2022   Mitral regurgitation 08/27/2022   NSTEMI (non-ST elevated myocardial infarction) (HCC) 08/18/2022   Acute clinical systolic heart failure (HCC) 08/18/2022   Acute blood loss anemia 08/18/2022   Anemia 08/18/2022   Critical limb ischemia of both lower extremities (HCC) 06/19/2022   DM type 2 (diabetes mellitus, type 2) (HCC) 01/28/2020   Hyperlipidemia 01/28/2020   Hypertension 01/28/2020   Hypothyroidism 01/28/2020   Obesity 01/28/2020   Ulcerated, foot, right, limited to breakdown of skin (HCC)    Cellulitis of right foot    Peripheral arterial disease (HCC)    Cellulitis 01/11/2020   Blister of leg 01/06/2020   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/28/2017   Chest pain 10/28/2017    GERD (gastroesophageal reflux disease) 10/28/2017   Past Medical History:  Diagnosis Date   Anemia    Aortic stenosis    Arthritis    CHF (congestive heart failure) (HCC)    Coronary artery disease    Diabetes mellitus without complication (HCC)    type 2   GERD (gastroesophageal reflux disease)    Heart murmur    History of blood transfusion    Hypercholesteremia    Hypertension    Hypothyroidism    Mitral regurgitation    Myocardial infarction (HCC)    Peripheral vascular disease (HCC)    PONV (postoperative nausea and vomiting)    Thyroid disease     Family History  Problem Relation Age of Onset   Diabetes Other    CAD Other     Past Surgical History:  Procedure Laterality Date   ABDOMINAL AORTOGRAM W/LOWER EXTREMITY Bilateral 01/14/2020   Procedure: ABDOMINAL AORTOGRAM W/LOWER EXTREMITY;  Surgeon: Cephus Shelling, MD;  Location: [facility];  Service: Cardiovascular;  Laterality: Bilateral;   ABDOMINAL AORTOGRAM W/LOWER EXTREMITY N/A 07/05/2022   Procedure: ABDOMINAL AORTOGRAM W/LOWER EXTREMITY;  Surgeon: Cephus Shelling, MD;  [facility]: [facility];  Service: Cardiovascular;  Laterality: N/A;   ABDOMINAL AORTOGRAM W/LOWER EXTREMITY N/A 08/02/2022   Procedure: ABDOMINAL AORTOGRAM W/LOWER EXTREMITY;  Surgeon: Cephus Shelling, MD;  Location: [facility];  Service: Cardiovascular;  Laterality: N/A;   ABDOMINAL AORTOGRAM W/LOWER EXTREMITY N/A 08/06/2022   Procedure: ABDOMINAL AORTOGRAM W/LOWER EXTREMITY;  Surgeon: Cephus Shelling, MD;  [facility];  Service: Cardiovascular;  Laterality: N/A;   AMPUTATION Bilateral 07/20/2022   Procedure: AMPUTATION RIGHT GREAT TOE AND SECOND TOE, AMPUTATION LEFT GREAT TOE;  Surgeon: Nadara Mustard, MD;  Location[facility] OR;  Service: Orthopedics;  Laterality: Bilateral;   AMPUTATION Bilateral 08/08/2022   Procedure: BILATERAL TRANSMETATARSAL AMPUTATION;  Surgeon: Nadara Mustard, MD;  Location: [facility]  OR;  Service: Orthopedics;  Laterality: Bilateral;   AMPUTATION TOE     PERIPHERAL VASCULAR ATHERECTOMY Right 01/14/2020   Procedure: PERIPHERAL VASCULAR ATHERECTOMY;  Surgeon: Cephus Shelling, MD;  Location: [facility] INVASIVE CV LAB;  Service: Cardiovascular;  Laterality: Right;  sfa   PERIPHERAL VASCULAR INTERVENTION Right 01/14/2020   Procedure: PERIPHERAL VASCULAR INTERVENTION;  Surgeon: Cephus Shelling, MD;  [facility];  Service: Cardiovascular;  Laterality: Right;  sfa stent    PERIPHERAL VASCULAR INTERVENTION  07/05/2022   Procedure: PERIPHERAL VASCULAR INTERVENTION;  Surgeon: Cephus Shelling, MD;  [facility];  Service: Cardiovascular;;   PERIPHERAL VASCULAR INTERVENTION  08/02/2022  Procedure: PERIPHERAL VASCULAR INTERVENTION;  Surgeon: Cephus Shelling, MD;  Location: [facility] INVASIVE CV LAB;  Service: Cardiovascular;;   PERIPHERAL VASCULAR INTERVENTION  08/06/2022   Procedure: PERIPHERAL VASCULAR INTERVENTION;  Surgeon: Cephus Shelling, MD;  Location: [facility] INVASIVE CV LAB;  Service: Cardiovascular;;   PERIPHERAL VASCULAR THROMBECTOMY  08/02/2022   Procedure: PERIPHERAL VASCULAR THROMBECTOMY;  Surgeon: Cephus Shelling, MD;  [facility]: [facility];  Service: Cardiovascular;;   RIGHT/LEFT HEART CATH AND CORONARY ANGIOGRAPHY N/A 08/23/2022   Procedure: RIGHT/LEFT HEART CATH AND CORONARY ANGIOGRAPHY;  Surgeon: [name], MD;  Location: [facility] INVASIVE CV LAB;  Service: Cardiovascular;  Laterality: N/A;   SKIN SPLIT GRAFT Right 03/18/2020   Procedure: SKIN GRAFTING RIGHT FOOT ULCER;  Surgeon: Nadara Mustard, MD;  Location: [facility] OR;  Service: Orthopedics;  Laterality: Right;   STUMP REVISION Right 11/30/2022   Procedure: REVISION RIGHT TRANSMETATARSAL AMPUTATION;  Surgeon: Nadara Mustard, MD;  Location: [facility] OR;  Service: Orthopedics;  Laterality: Right;   TEE WITHOUT CARDIOVERSION N/A 08/27/2022   Procedure: TRANSESOPHAGEAL ECHOCARDIOGRAM;   Surgeon: [name], MD;  Location: [facility] INVASIVE CV LAB;  Service: Cardiovascular;  Laterality: N/A;   WISDOM TOOTH EXTRACTION     Social History   Occupational History   Not on file  Tobacco Use   Smoking status: Former    Current packs/day: 0.00    Types: Cigarettes    Quit date: 01/2017    Years since quitting: 6.0   Smokeless tobacco: Never  Vaping Use   Vaping status: Never Used  Substance and Sexual Activity   Alcohol use: Not Currently   Drug use: Yes    Types: Marijuana    Comment: on ocassion- Last time - 11/13/22   Sexual activity: Yes    Birth control/protection: Post-menopausal

## 2023-03-05 ENCOUNTER — Ambulatory Visit (INDEPENDENT_AMBULATORY_CARE_PROVIDER_SITE_OTHER): Payer: Managed Care, Other (non HMO) | Admitting: Orthopedic Surgery

## 2023-03-05 DIAGNOSIS — Z09 Encounter for follow-up examination after completed treatment for conditions other than malignant neoplasm: Secondary | ICD-10-CM

## 2023-03-05 DIAGNOSIS — Z89439 Acquired absence of unspecified foot: Secondary | ICD-10-CM

## 2023-03-06 ENCOUNTER — Encounter: Payer: Self-pay | Admitting: Orthopedic Surgery

## 2023-03-06 NOTE — Progress Notes (Signed)
Office Visit Note   Patient: Kayla Schmitt           Date of Birth: [birth year]           MRN: [id number] Visit Date: 03/05/2023              Requested by: Medicine, [facility] [address] [place],  [place] [address] PCP: Medicine, [facility]  Chief Complaint  Patient presents with   Right Foot - Follow-up      HPI:. Patient is a 57 year old woman status post revision right transmetatarsal amputation over 3 months out from surgery.  Assessment & Plan: Visit Diagnoses:  1. History of transmetatarsal amputation of foot (HCC)     Plan: Patient's revision surgery continues to heal slowly.  Continue with current wound care  Follow-Up Instructions: Return in about 4 weeks (around 04/02/2023).   Ortho Exam  Patient is alert, oriented, no adenopathy, well-dressed, normal affect, normal respiratory effort. Examination there is a lateral wound that is 20 x 5 mm and 2 mm deep there is no cellulitis no drainage the wound bed has healthy granulation tissue.  There is also a plantar wound that is 5 mm in diameter.  This is flat.  Imaging: No results found. No images are attached to the encounter.  Labs: Lab Results  Component Value Date   HGBA1C 6.4 (H) 08/02/2022   HGBA1C 11.0 (H) 01/11/2020   ESRSEDRATE 136 (H) 01/14/2020   ESRSEDRATE 126 (H) 01/13/2020   ESRSEDRATE 125 (H) 01/12/2020   CRP 11.3 (H) 01/14/2020   CRP 13.0 (H) 01/13/2020   CRP 15.8 (H) 01/12/2020   LABURIC 7.7 (H) 02/04/2022   REPTSTATUS 08/07/2022 FINAL 08/02/2022   CULT  08/02/2022    NO GROWTH 5 DAYS Performed at [facility], [address], [place], [place] [address]      Lab Results  Component Value Date   ALBUMIN 1.6 (L) 08/06/2022   ALBUMIN 1.7 (L) 08/05/2022   ALBUMIN 1.9 (L) 08/04/2022    Lab Results  Component Value Date   MG 1.9 08/25/2022   MG 1.7 08/24/2022   MG 1.7 08/23/2022   No results found for: "VD25OH"  No results found for: "PREALBUMIN"    Latest Ref Rng  & Units 11/30/2022    5:53 AM 08/25/2022    2:35 AM 08/24/2022    3:09 AM  CBC EXTENDED  WBC 4.0 - 10.5 K/uL 7.7  4.9  4.6   RBC 3.87 - 5.11 MIL/uL 3.03  2.83  2.74   Hemoglobin 12.0 - 15.0 g/dL 8.9  7.8  7.7   HCT 32.9 - 46.0 % 27.1  25.9  24.4   Platelets 150 - 400 K/uL 208  327  298      There is no height or weight on file to calculate BMI.  Orders:  No orders of the defined types were placed in this encounter.  No orders of the defined types were placed in this encounter.    Procedures: No procedures performed  Clinical Data: No additional findings.  ROS:  All other systems negative, except as noted in the HPI. Review of Systems  Objective: Vital Signs: There were no vitals taken for this visit.  Specialty Comments:  No specialty comments available.  PMFS History: Patient Active Problem List   Diagnosis Date Noted   Osteomyelitis of foot, right, acute (HCC) 12/12/2022   CAD (coronary artery disease) 11/06/2022   Aortic stenosis 11/06/2022   Mitral regurgitation 08/27/2022  NSTEMI (non-ST elevated myocardial infarction) (HCC) 08/18/2022   Acute clinical systolic heart failure (HCC) 08/18/2022   Acute blood loss anemia 08/18/2022   Anemia 08/18/2022   Critical limb ischemia of both lower extremities (HCC) 06/19/2022   DM type 2 (diabetes mellitus, type 2) (HCC) 01/28/2020   Hyperlipidemia 01/28/2020   Hypertension 01/28/2020   Hypothyroidism 01/28/2020   Obesity 01/28/2020   Ulcerated, foot, right, limited to breakdown of skin (HCC)    Cellulitis of right foot    Peripheral arterial disease (HCC)    Cellulitis 01/11/2020   Blister of leg 01/06/2020   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/28/2017   Chest pain 10/28/2017   GERD (gastroesophageal reflux disease) 10/28/2017   Past Medical History:  Diagnosis Date   Anemia    Aortic stenosis    Arthritis    CHF (congestive heart failure) (HCC)    Coronary artery disease    Diabetes mellitus without  complication (HCC)    type 2   GERD (gastroesophageal reflux disease)    Heart murmur    History of blood transfusion    Hypercholesteremia    Hypertension    Hypothyroidism    Mitral regurgitation    Myocardial infarction (HCC)    Peripheral vascular disease (HCC)    PONV (postoperative nausea and vomiting)    Thyroid disease     Family History  Problem Relation Age of Onset   Diabetes Other    CAD Other     Past Surgical History:  Procedure Laterality Date   ABDOMINAL AORTOGRAM W/LOWER EXTREMITY Bilateral 01/14/2020   Procedure: ABDOMINAL AORTOGRAM W/LOWER EXTREMITY;  Surgeon: Cephus Shelling, MD;  Location: [facility];  Service: Cardiovascular;  Laterality: Bilateral;   ABDOMINAL AORTOGRAM W/LOWER EXTREMITY N/A 07/05/2022   Procedure: ABDOMINAL AORTOGRAM W/LOWER EXTREMITY;  Surgeon: Cephus Shelling, MD;  Location: [facility];  Service: Cardiovascular;  Laterality: N/A;   ABDOMINAL AORTOGRAM W/LOWER EXTREMITY N/A 08/02/2022   Procedure: ABDOMINAL AORTOGRAM W/LOWER EXTREMITY;  Surgeon: Cephus Shelling, MD;  Location: [facility];  Service: Cardiovascular;  Laterality: N/A;   ABDOMINAL AORTOGRAM W/LOWER EXTREMITY N/A 08/06/2022   Procedure: ABDOMINAL AORTOGRAM W/LOWER EXTREMITY;  Surgeon: Cephus Shelling, MD;  Location: [facility];  Service: Cardiovascular;  Laterality: N/A;   AMPUTATION Bilateral 07/20/2022   Procedure: AMPUTATION RIGHT GREAT TOE AND SECOND TOE, AMPUTATION LEFT GREAT TOE;  Surgeon: Nadara Mustard, MD;  Location: [facility] OR;  Service: Orthopedics;  Laterality: Bilateral;   AMPUTATION Bilateral 08/08/2022   Procedure: BILATERAL TRANSMETATARSAL AMPUTATION;  Surgeon: Nadara Mustard, MD;  Location: [facility] OR;  Service: Orthopedics;  Laterality: Bilateral;   AMPUTATION TOE     PERIPHERAL VASCULAR ATHERECTOMY Right 01/14/2020   Procedure: PERIPHERAL VASCULAR ATHERECTOMY;  Surgeon: Cephus Shelling, MD;  Location: [facility] INVAS[facility] CV LAB;   Service: Cardiovascular;  Laterality: Right;  sfa   PERIPHERAL VASCULAR INTERVENTION Right 01/14/2020   Procedure: PERIPHERAL VASCULAR INTERVENTION;  Surgeon: Cephus Shelling, MD;  Location: [facility];  Service: Cardiovascular;  Laterality: Right;  sfa stent    PERIPHERAL VASCULAR INTERVENTION  07/05/2022   Procedure: PERIPHERAL VASCULAR INTERVENTION;  Surgeon: Cephus Shelling, MD;  [facility]: [facility];  Service: Cardiovascular;;   PERIPHERAL VASCULAR INTERVENTION  08/02/2022   Procedure: PERIPHERAL VASCULAR INTERVENTION;  Surgeon: Cephus Shelling, MD;  [facility]: [facility];  Service: Cardiovascular;;   PERIPHERAL VASCULAR INTERVENTION  08/06/2022   Procedure: PERIPHERAL VASCULAR  INTERVENTION;  Surgeon: Cephus Shelling, MD;  Location: [facility] INVASIVE CV LAB;  Service: Cardiovascular;;   PERIPHERAL VASCULAR THROMBECTOMY  08/02/2022   Procedure: PERIPHERAL VASCULAR THROMBECTOMY;  Surgeon: Cephus Shelling, MD;  [facility]: [facility];  Service: Cardiovascular;;   RIGHT/LEFT HEART CATH AND CORONARY ANGIOGRAPHY N/A 08/23/2022   Procedure: RIGHT/LEFT HEART CATH AND CORONARY ANGIOGRAPHY;  Surgeon: [name], MD;  Location: [facility] INVASIVE CV LAB;  Service: Cardiovascular;  Laterality: N/A;   SKIN SPLIT GRAFT Right 03/18/2020   Procedure: SKIN GRAFTING RIGHT FOOT ULCER;  Surgeon: Nadara Mustard, MD;  Location: [facility] OR;  Service: Orthopedics;  Laterality: Right;   STUMP REVISION Right 11/30/2022   Procedure: REVISION RIGHT TRANSMETATARSAL AMPUTATION;  Surgeon: Nadara Mustard, MD;  Location: [facility] OR;  Service: Orthopedics;  Laterality: Right;   TEE WITHOUT CARDIOVERSION N/A 08/27/2022   Procedure: TRANSESOPHAGEAL ECHOCARDIOGRAM;  Surgeon: [name], MD;  Location: [facility] INVASIVE CV LAB;  Service: Cardiovascular;  Laterality: N/A;   WISDOM TOOTH EXTRACTION     Social History   Occupational History   Not on file  Tobacco Use   Smoking status: Former     Current packs/day: 0.00    Types: Cigarettes    Quit date: 01/2017    Years since quitting: 6.1   Smokeless tobacco: Never  Vaping Use   Vaping status: Never Used  Substance and Sexual Activity   Alcohol use: Not Currently   Drug use: Yes    Types: Marijuana    Comment: on ocassion- Last time - 11/13/22   Sexual activity: Yes    Birth control/protection: Post-menopausal

## 2023-03-18 ENCOUNTER — Encounter: Payer: Self-pay | Admitting: Orthopedic Surgery

## 2023-03-18 ENCOUNTER — Ambulatory Visit (INDEPENDENT_AMBULATORY_CARE_PROVIDER_SITE_OTHER): Payer: Managed Care, Other (non HMO) | Admitting: Orthopedic Surgery

## 2023-03-18 DIAGNOSIS — Z89439 Acquired absence of unspecified foot: Secondary | ICD-10-CM

## 2023-03-18 DIAGNOSIS — T8781 Dehiscence of amputation stump: Secondary | ICD-10-CM | POA: Diagnosis not present

## 2023-03-18 MED ORDER — SULFAMETHOXAZOLE-TRIMETHOPRIM 800-160 MG PO TABS: 1.0000 | ORAL_TABLET | Freq: Two times a day (BID) | ORAL | 0 refills | Status: DC

## 2023-03-18 NOTE — Progress Notes (Signed)
Office Visit Note   Patient: Kayla Schmitt           Date of Birth: 1965/09/14           MRN: [id number] Visit Date: 03/18/2023              Requested by: Medicine, [facility] [address] [place],  [place] [address] PCP: Medicine, [facility]  Chief Complaint  Patient presents with   Right Foot - Wound Check      HPI: Patient is a 57 year old woman who presents in follow-up for ulceration right transmetatarsal amputation.  Patient states that over the weekend she has had some chills with a temperature up to 100.  Temperature at this time 98.2.  Assessment & Plan: Visit Diagnoses:  1. History of transmetatarsal amputation of foot (HCC)   2. Dehiscence of amputation stump of right lower extremity (HCC)     Plan: Patient will resume Bactrim DS.  Continue with routine wound care.  Follow-Up Instructions: Return in about 2 weeks (around 04/01/2023).   Ortho Exam  Patient is alert, oriented, no adenopathy, well-dressed, normal affect, normal respiratory effort. Examination there is cellulitis over the lateral aspect of the foot.  Examination of the wound bed shows healthy granulation tissue with the wound 5 x 10 mm and 2 mm deep.  There is no exposed bone or tendon no purulent drainage.  There is cellulitis of the lateral aspect of her foot.  Imaging: No results found. No images are attached to the encounter.  Labs: Lab Results  Component Value Date   HGBA1C 6.4 (H) 08/02/2022   HGBA1C 11.0 (H) 01/11/2020   ESRSEDRATE 136 (H) 01/14/2020   ESRSEDRATE 126 (H) 01/13/2020   ESRSEDRATE 125 (H) 01/12/2020   CRP 11.3 (H) 01/14/2020   CRP 13.0 (H) 01/13/2020   CRP 15.8 (H) 01/12/2020   LABURIC 7.7 (H) 02/04/2022   REPTSTATUS 08/07/2022 FINAL 08/02/2022   CULT  08/02/2022    NO GROWTH 5 DAYS Performed at [facility], [address], [place], [place] [address]      Lab Results  Component Value Date   ALBUMIN 1.6 (L) 08/06/2022   ALBUMIN 1.7 (L) 08/05/2022    ALBUMIN 1.9 (L) 08/04/2022    Lab Results  Component Value Date   MG 1.9 08/25/2022   MG 1.7 08/24/2022   MG 1.7 08/23/2022   No results found for: "VD25OH"  No results found for: "PREALBUMIN"    Latest Ref Rng & Units 11/30/2022    5:53 AM 08/25/2022    2:35 AM 08/24/2022    3:09 AM  CBC EXTENDED  WBC 4.0 - 10.5 K/uL 7.7  4.9  4.6   RBC 3.87 - 5.11 MIL/uL 3.03  2.83  2.74   Hemoglobin 12.0 - 15.0 g/dL 8.9  7.8  7.7   HCT 32.4 - 46.0 % 27.1  25.9  24.4   Platelets 150 - 400 K/uL 208  327  298      There is no height or weight on file to calculate BMI.  Orders:  No orders of the defined types were placed in this encounter.  Meds ordered this encounter  Medications   sulfamethoxazole-trimethoprim (BACTRIM DS) 800-160 MG tablet    Sig: Take 1 tablet by mouth 2 (two) times daily.    Dispense:  40 tablet    Refill:  0     Procedures: No procedures performed  Clinical Data: No additional findings.  ROS:  All other  systems negative, except as noted in the HPI. Review of Systems  Objective: Vital Signs: There were no vitals taken for this visit.  Specialty Comments:  No specialty comments available.  PMFS History: Patient Active Problem List   Diagnosis Date Noted   Osteomyelitis of foot, right, acute (HCC) 12/12/2022   CAD (coronary artery disease) 11/06/2022   Aortic stenosis 11/06/2022   Mitral regurgitation 08/27/2022   NSTEMI (non-ST elevated myocardial infarction) (HCC) 08/18/2022   Acute clinical systolic heart failure (HCC) 08/18/2022   Acute blood loss anemia 08/18/2022   Anemia 08/18/2022   Critical limb ischemia of both lower extremities (HCC) 06/19/2022   DM type 2 (diabetes mellitus, type 2) (HCC) 01/28/2020   Hyperlipidemia 01/28/2020   Hypertension 01/28/2020   Hypothyroidism 01/28/2020   Obesity 01/28/2020   Ulcerated, foot, right, limited to breakdown of skin (HCC)    Cellulitis of right foot    Peripheral arterial disease (HCC)     Cellulitis 01/11/2020   Blister of leg 01/06/2020   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/28/2017   Chest pain 10/28/2017   GERD (gastroesophageal reflux disease) 10/28/2017   Past Medical History:  Diagnosis Date   Anemia    Aortic stenosis    Arthritis    CHF (congestive heart failure) (HCC)    Coronary artery disease    Diabetes mellitus without complication (HCC)    type 2   GERD (gastroesophageal reflux disease)    Heart murmur    History of blood transfusion    Hypercholesteremia    Hypertension    Hypothyroidism    Mitral regurgitation    Myocardial infarction (HCC)    Peripheral vascular disease (HCC)    PONV (postoperative nausea and vomiting)    Thyroid disease     Family History  Problem Relation Age of Onset   Diabetes Other    CAD Other     Past Surgical History:  Procedure Laterality Date   ABDOMINAL AORTOGRAM W/LOWER EXTREMITY Bilateral 01/14/2020   Procedure: ABDOMINAL AORTOGRAM W/LOWER EXTREMITY;  Surgeon: Cephus Shelling, MD;  Location: [facility];  Service: Cardiovascular;  Laterality: Bilateral;   ABDOMINAL AORTOGRAM W/LOWER EXTREMITY N/A 07/05/2022   Procedure: ABDOMINAL AORTOGRAM W/LOWER EXTREMITY;  Surgeon: Cephus Shelling, MD;  Location: [facility];  Service: Cardiovascular;  Laterality: N/A;   ABDOMINAL AORTOGRAM W/LOWER EXTREMITY N/A 08/02/2022   Procedure: ABDOMINAL AORTOGRAM W/LOWER EXTREMITY;  Surgeon: Cephus Shelling, MD;  Location: [facility];  Service: Cardiovascular;  Laterality: N/A;   ABDOMINAL AORTOGRAM W/LOWER EXTREMITY N/A 08/06/2022   Procedure: ABDOMINAL AORTOGRAM W/LOWER EXTREMITY;  Surgeon: Cephus Shelling, MD;  Location: [facility];  Service: Cardiovascular;  Laterality: N/A;   AMPUTATION Bilateral 07/20/2022   Procedure: AMPUTATION RIGHT GREAT TOE AND SECOND TOE, AMPUTATION LEFT GREAT TOE;  Surgeon: Nadara Mustard, MD;  Location[facility] OR;  Service: Orthopedics;  Laterality: Bilateral;    AMPUTATION Bilateral 08/08/2022   Procedure: BILATERAL TRANSMETATARSAL AMPUTATION;  Surgeon: Nadara Mustard, MD;  Location: [facility] OR;  Service: Orthopedics;  Laterality: Bilateral;   AMPUTATION TOE     PERIPHERAL VASCULAR ATHERECTOMY Right 01/14/2020   Procedure: PERIPHERAL VASCULAR ATHERECTOMY;  Surgeon: Cephus Shelling, MD;  Location: [facility] INVASIVE CV LAB;  Service: Cardiovascular;  Laterality: Right;  sfa   PERIPHERAL VASCULAR INTERVENTION Right 01/14/2020   Procedure: PERIPHERAL VASCULAR INTERVENTION;  Surgeon: Cephus Shelling, MD;  [facility];  Service: Cardiovascular;  Laterality: Right;  sfa stent    PERIPHERAL VASCULAR INTERVENTION  07/05/2022   Procedure: PERIPHERAL VASCULAR INTERVENTION;  Surgeon: Cephus Shelling, MD;  [facility]: [facility];  Service: Cardiovascular;;   PERIPHERAL VASCULAR INTERVENTION  08/02/2022   Procedure: PERIPHERAL VASCULAR INTERVENTION;  Surgeon: Cephus Shelling, MD;  Location: [facility];  Service: Cardiovascular;;   PERIPHERAL VASCULAR INTERVENTION  08/06/2022   Procedure: PERIPHERAL VASCULAR INTERVENTION;  Surgeon: Cephus Shelling, MD;  Location: [facility] INVASIVE CV LAB;  Service: Cardiovascular;;   PERIPHERAL VASCULAR THROMBECTOMY  08/02/2022   Procedure: PERIPHERAL VASCULAR THROMBECTOMY;  Surgeon: Cephus Shelling, MD;  Location: [facility];  Service: Cardiovascular;;   RIGHT/LEFT HEART CATH AND CORONARY ANGIOGRAPHY N/A 08/23/2022   Procedure: RIGHT/LEFT HEART CATH AND CORONARY ANGIOGRAPHY;  Surgeon: [name], MD;  Location: [facility] INVASIVE CV LAB;  Service: Cardiovascular;  Laterality: N/A;   SKIN SPLIT GRAFT Right 03/18/2020   Procedure: SKIN GRAFTING RIGHT FOOT ULCER;  Surgeon: Nadara Mustard, MD;  Location: [facility] OR;  Service: Orthopedics;  Laterality: Right;   STUMP REVISION Right 11/30/2022   Procedure: REVISION RIGHT TRANSMETATARSAL AMPUTATION;  Surgeon: Nadara Mustard, MD;  Location: [facility] OR;   Service: Orthopedics;  Laterality: Right;   TEE WITHOUT CARDIOVERSION N/A 08/27/2022   Procedure: TRANSESOPHAGEAL ECHOCARDIOGRAM;  Surgeon: [name], MD;  Location: [facility] INVASIVE CV LAB;  Service: Cardiovascular;  Laterality: N/A;   WISDOM TOOTH EXTRACTION     Social History   Occupational History   Not on file  Tobacco Use   Smoking status: Former    Current packs/day: 0.00    Types: Cigarettes    Quit date: 01/2017    Years since quitting: 6.1   Smokeless tobacco: Never  Vaping Use   Vaping status: Never Used  Substance and Sexual Activity   Alcohol use: Not Currently   Drug use: Yes    Types: Marijuana    Comment: on ocassion- Last time - 11/13/22   Sexual activity: Yes    Birth control/protection: Post-menopausal

## 2023-03-22 ENCOUNTER — Emergency Department (HOSPITAL_COMMUNITY): Payer: Managed Care, Other (non HMO)

## 2023-03-22 ENCOUNTER — Other Ambulatory Visit: Payer: Self-pay

## 2023-03-22 ENCOUNTER — Emergency Department (HOSPITAL_COMMUNITY)
Admission: EM | Admit: 2023-03-22 | Discharge: 2023-03-22 | Disposition: A | Discharge status: Home / Self Care | Payer: Managed Care, Other (non HMO) | Attending: Emergency Medicine | Admitting: Emergency Medicine

## 2023-03-22 ENCOUNTER — Encounter (HOSPITAL_COMMUNITY): Payer: Self-pay

## 2023-03-22 DIAGNOSIS — Z7902 Long term (current) use of antithrombotics/antiplatelets: Secondary | ICD-10-CM | POA: Insufficient documentation

## 2023-03-22 DIAGNOSIS — R1084 Generalized abdominal pain: Secondary | ICD-10-CM | POA: Insufficient documentation

## 2023-03-22 DIAGNOSIS — E119 Type 2 diabetes mellitus without complications: Secondary | ICD-10-CM | POA: Insufficient documentation

## 2023-03-22 DIAGNOSIS — Z7982 Long term (current) use of aspirin: Secondary | ICD-10-CM | POA: Insufficient documentation

## 2023-03-22 DIAGNOSIS — R112 Nausea with vomiting, unspecified: Secondary | ICD-10-CM | POA: Diagnosis not present

## 2023-03-22 LAB — LIPASE, BLOOD: Lipase: 24 U/L (ref 11–51)

## 2023-03-22 LAB — CBC
HCT: 23.9 % — ABNORMAL LOW (ref 36.0–46.0)
Hemoglobin: 7.4 g/dL — ABNORMAL LOW (ref 12.0–15.0)
MCH: 26.3 pg (ref 26.0–34.0)
MCHC: 31 g/dL (ref 30.0–36.0)
MCV: 85.1 fL (ref 80.0–100.0)
Platelets: 370 10*3/uL (ref 150–400)
RBC: 2.81 MIL/uL — ABNORMAL LOW (ref 3.87–5.11)
RDW: 15.3 % (ref 11.5–15.5)
WBC: 9.2 10*3/uL (ref 4.0–10.5)
nRBC: 0 % (ref 0.0–0.2)

## 2023-03-22 LAB — COMPREHENSIVE METABOLIC PANEL
ALT: 11 U/L (ref 0–44)
AST: 21 U/L (ref 15–41)
Albumin: 2.6 g/dL — ABNORMAL LOW (ref 3.5–5.0)
Alkaline Phosphatase: 124 U/L (ref 38–126)
Anion gap: 12 (ref 5–15)
BUN: 32 mg/dL — ABNORMAL HIGH (ref 6–20)
CO2: 21 mmol/L — ABNORMAL LOW (ref 22–32)
Calcium: 8.6 mg/dL — ABNORMAL LOW (ref 8.9–10.3)
Chloride: 101 mmol/L (ref 98–111)
Creatinine, Ser: 1.56 mg/dL — ABNORMAL HIGH (ref 0.44–1.00)
GFR, Estimated: 39 mL/min — ABNORMAL LOW (ref >60)
Glucose, Bld: 230 mg/dL — ABNORMAL HIGH (ref 70–99)
Potassium: 3.8 mmol/L (ref 3.5–5.1)
Sodium: 134 mmol/L — ABNORMAL LOW (ref 135–145)
Total Bilirubin: 0.8 mg/dL (ref <1.2)
Total Protein: 7.5 g/dL (ref 6.5–8.1)

## 2023-03-22 MED ORDER — OXYCODONE-ACETAMINOPHEN 5-325 MG PO TABS: 1.0000 | ORAL_TABLET | Freq: Four times a day (QID) | ORAL | 0 refills | Status: DC | PRN

## 2023-03-22 MED ORDER — SODIUM CHLORIDE 0.9 % IV BOLUS
1000.0000 mL | Freq: Once | INTRAVENOUS | Status: AC
  Administered 2023-03-22: 1000 mL via INTRAVENOUS

## 2023-03-22 MED ORDER — PROMETHAZINE HCL 25 MG PO TABS: 25.0000 mg | ORAL_TABLET | Freq: Four times a day (QID) | ORAL | 0 refills | Status: DC | PRN

## 2023-03-22 MED ORDER — IOHEXOL 300 MG/ML  SOLN
80.0000 mL | Freq: Once | INTRAMUSCULAR | Status: AC | PRN
  Administered 2023-03-22: 80 mL via INTRAVENOUS

## 2023-03-22 MED ORDER — SODIUM CHLORIDE 0.9 % IV SOLN
25.0000 mg | Freq: Once | INTRAVENOUS | Status: AC
  Administered 2023-03-22: 25 mg via INTRAVENOUS
  Filled 2023-03-22: qty 1

## 2023-03-22 MED ORDER — MORPHINE SULFATE (PF) 4 MG/ML IV SOLN
4.0000 mg | Freq: Once | INTRAVENOUS | Status: AC
  Administered 2023-03-22: 4 mg via INTRAVENOUS
  Filled 2023-03-22: qty 1

## 2023-03-22 MED ORDER — ONDANSETRON HCL 4 MG/2ML IJ SOLN
4.0000 mg | Freq: Once | INTRAMUSCULAR | Status: AC
  Administered 2023-03-22: 4 mg via INTRAVENOUS
  Filled 2023-03-22: qty 2

## 2023-03-22 NOTE — Discharge Instructions (Addendum)
You were seen in the emergency department for abdominal pain nausea and vomiting after restarting your [facility].  Your labs and CAT scan did not show definite explanation for your symptoms.  It is possibly related to your medication.  Please start with a clear liquid diet and advance as tolerated.  Stay well-hydrated.  We are prescribing you some pain and nausea medication.  Follow-up with your primary care doctor.  Return to the emergency department if any worsening or concerning symptoms

## 2023-03-22 NOTE — ED Provider Notes (Signed)
[place] [facility]PARTMENT AT [facility] Provider Note   CSN: [id number] Arrival date & time: 03/22/23  2440     History  Chief Complaint  Patient presents with   Abdominal Pain    Kayla Schmitt is a 57 y.o. female.  She has a history of diabetes, recently had toe amputation.  Started on Bactrim 5 days ago.  Also restarted her Wegovy 3 days ago.  Has had 2 days of nausea and vomiting.  No fever no diarrhea.  Generalized abdominal pain which she feels is from her vomiting.  No cough or shortness of breath.  Has not checked her blood sugars.  Did not take her insulin for the last 2 days due to her vomiting.  Tried Pepto-Bismol without improvement.  The history is provided by the patient.  Abdominal Pain Pain location:  Generalized Pain quality: aching   Pain severity:  Moderate Onset quality:  Gradual Duration:  2 days Timing:  Constant Progression:  Unchanged Chronicity:  New Relieved by:  Nothing Ineffective treatments:  OTC medications Associated symptoms: nausea and vomiting   Associated symptoms: no chest pain, no constipation, no cough, no diarrhea, no dysuria, no fever, no hematemesis and no shortness of breath        Home Medications Prior to Admission medications   Medication Sig Start Date End Date Taking? Authorizing Provider  acetaminophen (TYLENOL) 500 MG tablet Take 500-1,000 mg by mouth every 6 (six) hours as needed for moderate pain.    [provider]  amLODipine (NORVASC) 10 MG tablet Take 10 mg by mouth daily.    [provider]  aspirin 81 MG EC tablet Take 81 mg by mouth daily.    [provider]  atorvastatin (LIPITOR) 40 MG tablet Take 40 mg by mouth daily. 12/14/19   [provider]  clopidogrel (PLAVIX) 75 MG tablet TAKE 1 TABLET BY MOUTH EVERY DAY WITH BREAKFAST 01/28/23   Maeola Harman, MD  diphenhydrAMINE HCl, Sleep, (ZZZQUIL) 25 MG CAPS Take 25 mg by mouth at bedtime as needed  (Sleep).    [provider]  furosemide (LASIX) 40 MG tablet Take 1 tablet (40 mg total) by mouth 2 (two) times daily. 08/27/22   Willeen Niece, MD  gabapentin (NEURONTIN) 300 MG capsule Take 300 mg by mouth at bedtime.    [provider]  insulin degludec (TRESIBA FLEXTOUCH) 100 UNIT/ML FlexTouch Pen Inject 10 Units into the skin at bedtime. 01/15/20   Danford, Earl Lites, MD  levothyroxine (SYNTHROID) 150 MCG tablet Take 150 mcg by mouth daily. 12/02/19   [provider]  lisinopril (ZESTRIL) 2.5 MG tablet Take 1 tablet (2.5 mg total) by mouth daily. 11/06/22   Mallipeddi, Vishnu P, MD  metoprolol succinate (TOPROL-XL) 25 MG 24 hr tablet Take 0.5 tablets (12.5 mg total) by mouth daily. 08/28/22   Willeen Niece, MD  nitroGLYCERIN (NITROSTAT) 0.4 MG SL tablet Place 1 tablet (0.4 mg total) under the tongue every 5 (five) minutes x 3 doses as needed for chest pain (if no relief after 3rd dose proceed to ED or call 911). 11/06/2022-New 11/06/22   Mallipeddi, Vishnu P, MD  pantoprazole (PROTONIX) 40 MG tablet Take 40 mg by mouth daily. 12/21/19   [provider]  Semaglutide, 1 MG/DOSE, (OZEMPIC, 1 MG/DOSE,) 2 MG/1.5ML SOPN Inject 0.25 mg into the skin every Saturday.    [provider]  sulfamethoxazole-trimethoprim (BACTRIM DS) 800-160 MG tablet Take 1 tablet by mouth 2 (two) times daily.  12/13/22   Nadara Mustard, MD  sulfamethoxazole-trimethoprim (BACTRIM DS) 800-160 MG tablet Take 1 tablet by mouth 2 (two) times daily. 01/29/23   Nadara Mustard, MD  sulfamethoxazole-trimethoprim (BACTRIM DS) 800-160 MG tablet Take 1 tablet by mouth 2 (two) times daily. 03/18/23   Nadara Mustard, MD  Zinc 50 MG TABS Take 50 mg by mouth daily.    [provider]      Allergies    Doxycycline, Trental [pentoxifylline], and Penicillins    Review of Systems   Review of Systems  Constitutional:  Negative for fever.  Respiratory:  Negative for cough and shortness of  breath.   Cardiovascular:  Negative for chest pain.  Gastrointestinal:  Positive for abdominal pain, nausea and vomiting. Negative for constipation, diarrhea and hematemesis.  Genitourinary:  Negative for dysuria.    Physical Exam Updated Vital Signs BP 122/72   Pulse 99   Temp 98.8 F (37.1 C)   Resp 18   SpO2 98%  Physical Exam Vitals and nursing note reviewed.  Constitutional:      General: She is not in acute distress.    Appearance: She is well-developed.  HENT:     Head: Normocephalic and atraumatic.  Eyes:     Conjunctiva/sclera: Conjunctivae normal.  Cardiovascular:     Rate and Rhythm: Normal rate and regular rhythm.     Heart sounds: No murmur heard. Pulmonary:     Effort: Pulmonary effort is normal. No respiratory distress.     Breath sounds: Normal breath sounds.  Abdominal:     Palpations: Abdomen is soft.     Tenderness: There is generalized abdominal tenderness. There is no guarding or rebound.  Musculoskeletal:        General: No swelling.     Cervical back: Neck supple.  Skin:    General: Skin is warm and dry.     Capillary Refill: Capillary refill takes less than 2 seconds.  Neurological:     General: No focal deficit present.     Mental Status: She is alert.     ED Results / Procedures / Treatments   Labs (all labs ordered are listed, but only abnormal results are displayed) Labs Reviewed  COMPREHENSIVE METABOLIC PANEL - Abnormal; Notable for the following components:      Result Value   Sodium 134 (*)    CO2 21 (*)    Glucose, Bld 230 (*)    BUN 32 (*)    Creatinine, Ser 1.56 (*)    Calcium 8.6 (*)    Albumin 2.6 (*)    GFR, Estimated 39 (*)    All other components within normal limits  CBC - Abnormal; Notable for the following components:   RBC 2.81 (*)    Hemoglobin 7.4 (*)    HCT 23.9 (*)    All other components within normal limits  LIPASE, BLOOD  URINALYSIS, ROUTINE W REFLEX MICROSCOPIC    EKG None  Radiology CT ABDOMEN  PELVIS W CONTRAST  Result Date: 03/22/2023 CLINICAL DATA:  Acute generalized abdominal pain. EXAM: CT ABDOMEN AND PELVIS WITH CONTRAST TECHNIQUE: Multidetector CT imaging of the abdomen and pelvis was performed using the standard protocol following bolus administration of intravenous contrast. RADIATION DOSE REDUCTION: This exam was performed according to the departmental dose-optimization program which includes automated exposure control, adjustment of the mA and/or kV according to patient size and/or use of iterative reconstruction technique. CONTRAST:  80mL OMNIPAQUE IOHEXOL 300 MG/ML  SOLN COMPARISON:  None Available.  FINDINGS: Lower chest: Reticular thickening is noted in the visualized lung bases concerning for possible pulmonary edema. Minimal bilateral pleural effusions are noted. Hepatobiliary: No focal liver abnormality is seen. No gallstones, gallbladder wall thickening, or biliary dilatation. Pancreas: Unremarkable. No pancreatic ductal dilatation or surrounding inflammatory changes. Spleen: Normal in size without focal abnormality. Adrenals/Urinary Tract: Adrenal glands are unremarkable. Kidneys are normal, without renal calculi, focal lesion, or hydronephrosis. Bladder is unremarkable. Stomach/Bowel: The stomach is unremarkable. There is no evidence of bowel obstruction or inflammation. The appendix is not visualized. Vascular/Lymphatic: Aortic atherosclerosis. No enlarged abdominal or pelvic lymph nodes. Reproductive: Uterus and bilateral adnexa are unremarkable. Other: No abdominal wall hernia or abnormality. No abdominopelvic ascites. Musculoskeletal: No acute or significant osseous findings. IMPRESSION: Possible minimal pulmonary edema seen in visualized lung bases with minimal pleural effusions. No acute abnormality seen in the abdomen or pelvis. Aortic Atherosclerosis (ICD10-I70.0). Electronically Signed   By: Lupita Raider M.D.   On: 03/22/2023 08:59    Procedures Procedures     Medications Ordered in ED Medications  sodium chloride 0.9 % bolus 1,000 mL (0 mLs Intravenous Stopped 03/22/23 0932)  ondansetron (ZOFRAN) injection 4 mg (4 mg Intravenous Given 03/22/23 0729)  morphine (PF) 4 MG/ML injection 4 mg (4 mg Intravenous Given 03/22/23 0729)  iohexol (OMNIPAQUE) 300 MG/ML solution 80 mL (80 mLs Intravenous Contrast Given 03/22/23 0817)  promethazine (PHENERGAN) 25 mg in sodium chloride 0.9 % 50 mL IVPB (0 mg Intravenous Stopped 03/22/23 1122)  morphine (PF) 4 MG/ML injection 4 mg (4 mg Intravenous Given 03/22/23 1125)    ED Course/ Medical Decision Making/ A&P Clinical Course as of 03/22/23 1752  Fri Mar 22, 2023  0910 Patient's workup showing some AKI, no evidence of DKA.  Hemoglobin is also slightly lower than recent's.  CT does not show any obvious explanation for her symptoms.  She is still feeling quite nauseous so we will try with some Phenergan. [MB]    Clinical Course User Index [MB] Terrilee Files, MD                                 Medical Decision Making Amount and/or Complexity of Data Reviewed Labs: ordered. Radiology: ordered.  Risk Prescription drug management.   This patient complains of generalized abdominal pain nausea vomiting; this involves an extensive number of treatment Options and is a complaint that carries with it a high risk of complications and morbidity. The differential includes gastroenteritis, obstruction, diverticulitis, colitis, biliary colic, medication side effect, pancreatitis  I ordered, reviewed and interpreted labs, which included CBC with normal white count, hemoglobin slightly down from priors, chemistries with mild elevation of BUN and creatinine and lower bicarb, lipase normal, LFTs normal I ordered medication IV fluids IV nausea medication and pain medication and reviewed PMP when indicated. I ordered imaging studies which included CT abdomen and pelvis and I independently    visualized and  interpreted imaging which showed no acute abnormalities in the abdomen and pelvis Additional history obtained from patient's mother Previous records obtained and reviewed in epic including recent orthopedic notes about her recent toe amputation Cardiac monitoring reviewed, sinus rhythm Social determinants considered, no significant barriers Critical Interventions: None  After the interventions stated above, I reevaluated the patient and found patient to be feeling a little bit better and able to ambulate to the bathroom without any difficulty Admission and further testing considered, she is comfortable plan  for outpatient follow-up with her PCP.  Will provide prescription for nausea medication pain medication.  Strict return instructions provided and discussed         Final Clinical Impression(s) / ED Diagnoses Final diagnoses:  Generalized abdominal pain  Nausea and vomiting, unspecified vomiting type    Rx / DC Orders ED Discharge Orders          Ordered    promethazine (PHENERGAN) 25 MG tablet  Every 6 hours PRN        03/22/23 1404    oxyCODONE-acetaminophen (PERCOCET/ROXICET) 5-325 MG tablet  Every 6 hours PRN        03/22/23 1404              Terrilee Files, MD 03/22/23 1755

## 2023-03-22 NOTE — ED Triage Notes (Signed)
Pt c/o generalized abdominal pain that started yesterday. Endorses N/V. Pani is constant and 7/10.

## 2023-03-22 NOTE — ED Notes (Signed)
Patient asked about PO challenge. She doesn't feel like she's able to have any food but is willing to try fluids. Water given

## 2023-03-26 ENCOUNTER — Ambulatory Visit (HOSPITAL_COMMUNITY)
Admission: RE | Admit: 2023-03-26 | Discharge: 2023-03-26 | Disposition: A | Discharge status: Home / Self Care | Payer: Managed Care, Other (non HMO) | Source: Ambulatory Visit | Attending: Vascular Surgery | Admitting: Vascular Surgery

## 2023-03-26 ENCOUNTER — Ambulatory Visit (INDEPENDENT_AMBULATORY_CARE_PROVIDER_SITE_OTHER)
Admission: RE | Admit: 2023-03-26 | Discharge: 2023-03-26 | Disposition: A | Discharge status: Home / Self Care | Payer: Managed Care, Other (non HMO) | Source: Ambulatory Visit | Attending: Vascular Surgery

## 2023-03-26 ENCOUNTER — Encounter: Payer: Self-pay | Admitting: Vascular Surgery

## 2023-03-26 ENCOUNTER — Ambulatory Visit (INDEPENDENT_AMBULATORY_CARE_PROVIDER_SITE_OTHER): Payer: Managed Care, Other (non HMO) | Admitting: Vascular Surgery

## 2023-03-26 VITALS — BP 95/60 | HR 83 | Temp 97.8°F | Ht 64.0 in | Wt 191.6 lb

## 2023-03-26 DIAGNOSIS — I70223 Atherosclerosis of native arteries of extremities with rest pain, bilateral legs: Secondary | ICD-10-CM

## 2023-03-26 DIAGNOSIS — I739 Peripheral vascular disease, unspecified: Secondary | ICD-10-CM

## 2023-03-26 LAB — VAS US ABI WITH/WO TBI

## 2023-03-26 NOTE — Progress Notes (Signed)
Patient name: Kayla Schmitt MRN: [id number] DOB: 02-24-1966 Sex: female  REASON FOR VISIT: 1 month follow-up, wound check right TMA  HPI: Kayla Schmitt is a 57 y.o. female with history of hypertension, hyperlipidemia, diabetes that presents for hospital follow-up and interval wound check.  Most recently on 08/02/2022 she underwent left SFA and above-knee popliteal percutaneous thrombectomy with JETI and additional angioplasty and stenting into the above-knee popliteal artery for an occluded distal SFA stent with tissue loss.  She then on 08/06/2022 underwent laser arthrectomy of the right SFA above-knee popliteal with DCB for in-stent stenosis.  She had bilateral TMA's with Dr. Lajoyce Corners on 08/08/2022.  The left TMA is healed.  The right TMA has been slow to heal.  She has undergoing recent revision of the right TMA with kerecis.    Today reports the right TMA has healed.  Past Medical History:  Diagnosis Date   Anemia    Aortic stenosis    Arthritis    CHF (congestive heart failure) (HCC)    Coronary artery disease    Diabetes mellitus without complication (HCC)    type 2   GERD (gastroesophageal reflux disease)    Heart murmur    History of blood transfusion    Hypercholesteremia    Hypertension    Hypothyroidism    Mitral regurgitation    Myocardial infarction (HCC)    Peripheral vascular disease (HCC)    PONV (postoperative nausea and vomiting)    Thyroid disease     Past Surgical History:  Procedure Laterality Date   ABDOMINAL AORTOGRAM W/LOWER EXTREMITY Bilateral 01/14/2020   Procedure: ABDOMINAL AORTOGRAM W/LOWER EXTREMITY;  Surgeon: Cephus Shelling, MD;  Location: [facility] INVAS[facility];  Service: Cardiovascular;  Laterality: Bilateral;   ABDOMINAL AORTOGRAM W/LOWER EXTREMITY N/A 07/05/2022   Procedure: ABDOMINAL AORTOGRAM W/LOWER EXTREMITY;  Surgeon: Cephus Shelling, MD;  Location: [facility];  Service: Cardiovascular;  Laterality: N/A;   ABDOMINAL  AORTOGRAM W/LOWER EXTREMITY N/A 08/02/2022   Procedure: ABDOMINAL AORTOGRAM W/LOWER EXTREMITY;  Surgeon: Cephus Shelling, MD;  Location: [facility];  Service: Cardiovascular;  Laterality: N/A;   ABDOMINAL AORTOGRAM W/LOWER EXTREMITY N/A 08/06/2022   Procedure: ABDOMINAL AORTOGRAM W/LOWER EXTREMITY;  Surgeon: Cephus Shelling, MD;  Location: [facility];  Service: Cardiovascular;  Laterality: N/A;   AMPUTATION Bilateral 07/20/2022   Procedure: AMPUTATION RIGHT GREAT TOE AND SECOND TOE, AMPUTATION LEFT GREAT TOE;  Surgeon: Nadara Mustard, MD;  Location: [facility] OR;  Service: Orthopedics;  Laterality: Bilateral;   AMPUTATION Bilateral 08/08/2022   Procedure: BILATERAL TRANSMETATARSAL AMPUTATION;  Surgeon: Nadara Mustard, MD;  Location: [facility] OR;  Service: Orthopedics;  Laterality: Bilateral;   AMPUTATION TOE     PERIPHERAL VASCULAR ATHERECTOMY Right 01/14/2020   Procedure: PERIPHERAL VASCULAR ATHERECTOMY;  Surgeon: Cephus Shelling, MD;  Location: [facility] INVASIVE CV LAB;  Service: Cardiovascular;  Laterality: Right;  sfa   PERIPHERAL VASCULAR INTERVENTION Right 01/14/2020   Procedure: PERIPHERAL VASCULAR INTERVENTION;  Surgeon: Cephus Shelling, MD;  Location: [facility];  Service: Cardiovascular;  Laterality: Right;  sfa stent    PERIPHERAL VASCULAR INTERVENTION  07/05/2022   Procedure: PERIPHERAL VASCULAR INTERVENTION;  Surgeon: Cephus Shelling, MD;  Location: [facility];  Service: Cardiovascular;;   PERIPHERAL VASCULAR INTERVENTION  08/02/2022   Procedure: PERIPHERAL VASCULAR INTERVENTION;  Surgeon: Cephus Shelling, MD;  Location: [facility];  Service: Cardiovascular;;   PERIPHERAL VASCULAR INTERVENTION  08/06/2022  Procedure: PERIPHERAL VASCULAR INTERVENTION;  Surgeon: Cephus Shelling, MD;  Location: [facility] INVASIVE CV LAB;  Service: Cardiovascular;;   PERIPHERAL VASCULAR THROMBECTOMY  08/02/2022   Procedure: PERIPHERAL VASCULAR THROMBECTOMY;  Surgeon:  Cephus Shelling, MD;  Location: [facility];  Service: Cardiovascular;;   RIGHT/LEFT HEART CATH AND CORONARY ANGIOGRAPHY N/A 08/23/2022   Procedure: RIGHT/LEFT HEART CATH AND CORONARY ANGIOGRAPHY;  Surgeon: [name], MD;  Location[facility] INVASIVE CV LAB;  Service: Cardiovascular;  Laterality: N/A;   SKIN SPLIT GRAFT Right 03/18/2020   Procedure: SKIN GRAFTING RIGHT FOOT ULCER;  Surgeon: Nadara Mustard, MD;  Location[facility] OR;  Service: Orthopedics;  Laterality: Right;   STUMP REVISION Right 11/30/2022   Procedure: REVISION RIGHT TRANSMETATARSAL AMPUTATION;  Surgeon: Nadara Mustard, MD;  Location[facility] OR;  Service: Orthopedics;  Laterality: Right;   TEE WITHOUT CARDIOVERSION N/A 08/27/2022   Procedure: TRANSESOPHAGEAL ECHOCARDIOGRAM;  Surgeon: [name], MD;  Location[facility] INVASIVE CV LAB;  Service: Cardiovascular;  Laterality: N/A;   WISDOM TOOTH EXTRACTION      Family History  Problem Relation Age of Onset   Diabetes Other    CAD Other     SOCIAL HISTORY: Social History   Tobacco Use   Smoking status: Former    Current packs/day: 0.00    Types: Cigarettes    Quit date: 01/2017    Years since quitting: 6.1   Smokeless tobacco: Never  Substance Use Topics   Alcohol use: Not Currently    Allergies  Allergen Reactions   Doxycycline Nausea And Vomiting    Extremely N/V   Trental [Pentoxifylline] Nausea And Vomiting   Penicillins Rash    Tolerated cephalosporins 07/2022     Current Outpatient Medications  Medication Sig Dispense Refill   acetaminophen (TYLENOL) 500 MG tablet Take 500-1,000 mg by mouth every 6 (six) hours as needed for moderate pain.     amLODipine (NORVASC) 10 MG tablet Take 10 mg by mouth daily.     aspirin 81 MG EC tablet Take 81 mg by mouth daily.     atorvastatin (LIPITOR) 40 MG tablet Take 40 mg by mouth daily.     clopidogrel (PLAVIX) 75 MG tablet TAKE 1 TABLET BY MOUTH EVERY DAY WITH BREAKFAST 90 tablet 3   diphenhydrAMINE  HCl, Sleep, (ZZZQUIL) 25 MG CAPS Take 25 mg by mouth at bedtime as needed (Sleep).     furosemide (LASIX) 40 MG tablet Take 1 tablet (40 mg total) by mouth 2 (two) times daily. 30 tablet 3   gabapentin (NEURONTIN) 300 MG capsule Take 300 mg by mouth at bedtime.     insulin degludec (TRESIBA FLEXTOUCH) 100 UNIT/ML FlexTouch Pen Inject 10 Units into the skin at bedtime. 3 mL 3   levothyroxine (SYNTHROID) 150 MCG tablet Take 150 mcg by mouth daily.     lisinopril (ZESTRIL) 2.5 MG tablet Take 1 tablet (2.5 mg total) by mouth daily. 90 tablet 2   metoprolol succinate (TOPROL-XL) 25 MG 24 hr tablet Take 0.5 tablets (12.5 mg total) by mouth daily. 30 tablet 1   nitroGLYCERIN (NITROSTAT) 0.4 MG SL tablet Place 1 tablet (0.4 mg total) under the tongue every 5 (five) minutes x 3 doses as needed for chest pain (if no relief after 3rd dose proceed to ED or call 911). 11/06/2022-New 25 tablet 1   oxyCODONE-acetaminophen (PERCOCET/ROXICET) 5-325 MG tablet Take 1 tablet by mouth every 6 (six) hours as needed for severe pain (pain score 7-10). 15 tablet  0   OZEMPIC, 1 MG/DOSE, 4 MG/3ML SOPN Inject 1 mg into the skin once a week.     pantoprazole (PROTONIX) 40 MG tablet Take 40 mg by mouth daily.     promethazine (PHENERGAN) 25 MG tablet Take 1 tablet (25 mg total) by mouth every 6 (six) hours as needed for nausea or vomiting. 30 tablet 0   sulfamethoxazole-trimethoprim (BACTRIM DS) 800-160 MG tablet Take 1 tablet by mouth 2 (two) times daily. 40 tablet 0   Zinc 50 MG TABS Take 50 mg by mouth daily.     sulfamethoxazole-trimethoprim (BACTRIM DS) 800-160 MG tablet Take 1 tablet by mouth 2 (two) times daily. 30 tablet 0   sulfamethoxazole-trimethoprim (BACTRIM DS) 800-160 MG tablet Take 1 tablet by mouth 2 (two) times daily. 30 tablet 0   No current facility-administered medications for this visit.    REVIEW OF SYSTEMS:  [X]  denotes positive finding, [ ]  denotes negative finding Cardiac  Comments:  Chest pain or  chest pressure:    Shortness of breath upon exertion:    Short of breath when lying flat:    Irregular heart rhythm:        Vascular    Pain in calf, thigh, or hip brought on by ambulation:    Pain in feet at night that wakes you up from your sleep:     Blood clot in your veins:    Leg swelling:         Pulmonary    Oxygen at home:    Productive cough:     Wheezing:         Neurologic    Sudden weakness in arms or legs:     Sudden numbness in arms or legs:     Sudden onset of difficulty speaking or slurred speech:    Temporary loss of vision in one eye:     Problems with dizziness:         Gastrointestinal    Blood in stool:     Vomited blood:         Genitourinary    Burning when urinating:     Blood in urine:        Psychiatric    Major depression:         Hematologic    Bleeding problems:    Problems with blood clotting too easily:        Skin    Rashes or ulcers:        Constitutional    Fever or chills:      PHYSICAL EXAM: Vitals:   03/26/23 1146  BP: 95/60  Pulse: 83  Temp: 97.8 F (36.6 C)  TempSrc: Temporal  SpO2: 97%  Weight: 191 lb 9.6 oz (86.9 kg)  Height: 5\' 4"  (1.626 m)    GENERAL: The patient is a well-nourished female, in no acute distress. The vital signs are documented above. CARDIAC: There is a regular rate and rhythm.  VASCULAR:  Bilateral femoral pulses palpable Left TMA healed  Right TMA nearly healed and much improved Brisk right PT and DP signal at the ankle             DATA:   Right leg arterial duplex shows a moderate stenosis in the SFA stent with a velocity of 202 but no high-grade flow-limiting stenosis.  ABIs are noncompressible  Assessment/Plan:  57 year old female presents for 1 month interval follow-up after recent bilateral lower extremity multilevel complex revascularization for critical limb ischemia with tissue loss.  She has since undergone bilateral TMA's with Dr. Lajoyce Corners on 08/08/2022.  The left  TMA is healed.  The right TMA had some dehiscence and required wound care and ultimately underwent revision in the OR with Dr. Lajoyce Corners with Kerecis grafting as recently as 11/30/22.    Fortunately today the right TMA is now healed as pictured above.  I am very pleased with her progress.  Her right SFA stents are patent without any high-grade flow-limiting stenosis.  She has brisk Doppler signals at the ankle.  I will see her in 6 months with bilateral lower extremity arterial duplex and ABIs for ongoing surveillance.  She is on aspirin statin Plavix.  Cephus Shelling, MD [facility] Office: [phone]

## 2023-04-01 ENCOUNTER — Telehealth: Payer: Self-pay | Admitting: Internal Medicine

## 2023-04-01 NOTE — Telephone Encounter (Signed)
Patient notified & states / confirms that she does not want to go to the ED.  Appointment scheduled for Wednesday, 04/03/23 at 4:00 with Sharlene Dory, NP in [place] office.  Reminded to go to ED in the meantime if symptoms worsen.  She verbalized understanding.

## 2023-04-01 NOTE — Telephone Encounter (Signed)
Pt calling in for an update. She does not want to go to ED

## 2023-04-01 NOTE — Telephone Encounter (Signed)
Pt c/o Shortness Of Breath: STAT if SOB developed within the last 24 hours or pt is noticeably SOB on the phone  1. Are you currently SOB (can you hear that pt is SOB on the phone)? No  2. How long have you been experiencing SOB? This morning  3. Are you SOB when sitting or when up moving around? Moving around  4. Are you currently experiencing any other symptoms? A little dizziness

## 2023-04-01 NOTE — Telephone Encounter (Signed)
Spoke with patient at time of incoming call - c/o sob mostly with exertion x 1 1/2 - 2 weeks.  Initially had really bad stomach issue on 03/22/23 in which she went to [facility] ED for this.  Now has been several days out from the stomach issue but feels like she just can't get her stamina back up to normal.  No c/o chest pain, does mention slight dizziness off/on.  States her BP was low while at the ED but has not been checking at home.  Will send to provider for further advice.

## 2023-04-02 ENCOUNTER — Other Ambulatory Visit: Payer: Self-pay

## 2023-04-02 ENCOUNTER — Ambulatory Visit (INDEPENDENT_AMBULATORY_CARE_PROVIDER_SITE_OTHER): Payer: Managed Care, Other (non HMO) | Admitting: Orthopedic Surgery

## 2023-04-02 DIAGNOSIS — I739 Peripheral vascular disease, unspecified: Secondary | ICD-10-CM

## 2023-04-02 DIAGNOSIS — T8781 Dehiscence of amputation stump: Secondary | ICD-10-CM

## 2023-04-02 DIAGNOSIS — Z89439 Acquired absence of unspecified foot: Secondary | ICD-10-CM

## 2023-04-03 ENCOUNTER — Emergency Department (HOSPITAL_COMMUNITY): Payer: Managed Care, Other (non HMO)

## 2023-04-03 ENCOUNTER — Inpatient Hospital Stay (HOSPITAL_COMMUNITY)
Admission: EM | Admit: 2023-04-03 | Discharge: 2023-04-07 | DRG: 811 | Disposition: A | Discharge status: Home / Self Care | Payer: Managed Care, Other (non HMO) | Attending: Internal Medicine | Admitting: Internal Medicine

## 2023-04-03 ENCOUNTER — Encounter: Payer: Self-pay | Admitting: Orthopedic Surgery

## 2023-04-03 ENCOUNTER — Ambulatory Visit: Payer: Managed Care, Other (non HMO) | Admitting: Nurse Practitioner

## 2023-04-03 ENCOUNTER — Other Ambulatory Visit: Payer: Self-pay

## 2023-04-03 DIAGNOSIS — Z881 Allergy status to other antibiotic agents status: Secondary | ICD-10-CM

## 2023-04-03 DIAGNOSIS — I10 Essential (primary) hypertension: Secondary | ICD-10-CM | POA: Diagnosis present

## 2023-04-03 DIAGNOSIS — D509 Iron deficiency anemia, unspecified: Secondary | ICD-10-CM | POA: Diagnosis not present

## 2023-04-03 DIAGNOSIS — I340 Nonrheumatic mitral (valve) insufficiency: Secondary | ICD-10-CM

## 2023-04-03 DIAGNOSIS — E66811 Obesity, class 1: Secondary | ICD-10-CM | POA: Diagnosis present

## 2023-04-03 DIAGNOSIS — N179 Acute kidney failure, unspecified: Secondary | ICD-10-CM | POA: Diagnosis present

## 2023-04-03 DIAGNOSIS — E1169 Type 2 diabetes mellitus with other specified complication: Secondary | ICD-10-CM | POA: Diagnosis not present

## 2023-04-03 DIAGNOSIS — E860 Dehydration: Secondary | ICD-10-CM | POA: Diagnosis present

## 2023-04-03 DIAGNOSIS — Z89431 Acquired absence of right foot: Secondary | ICD-10-CM

## 2023-04-03 DIAGNOSIS — K219 Gastro-esophageal reflux disease without esophagitis: Secondary | ICD-10-CM | POA: Diagnosis present

## 2023-04-03 DIAGNOSIS — E119 Type 2 diabetes mellitus without complications: Secondary | ICD-10-CM

## 2023-04-03 DIAGNOSIS — I083 Combined rheumatic disorders of mitral, aortic and tricuspid valves: Secondary | ICD-10-CM | POA: Diagnosis present

## 2023-04-03 DIAGNOSIS — Z6833 Body mass index (BMI) 33.0-33.9, adult: Secondary | ICD-10-CM

## 2023-04-03 DIAGNOSIS — N1831 Chronic kidney disease, stage 3a: Secondary | ICD-10-CM | POA: Diagnosis present

## 2023-04-03 DIAGNOSIS — Z833 Family history of diabetes mellitus: Secondary | ICD-10-CM

## 2023-04-03 DIAGNOSIS — Z87891 Personal history of nicotine dependence: Secondary | ICD-10-CM

## 2023-04-03 DIAGNOSIS — E1151 Type 2 diabetes mellitus with diabetic peripheral angiopathy without gangrene: Secondary | ICD-10-CM | POA: Diagnosis present

## 2023-04-03 DIAGNOSIS — I5043 Acute on chronic combined systolic (congestive) and diastolic (congestive) heart failure: Secondary | ICD-10-CM | POA: Diagnosis present

## 2023-04-03 DIAGNOSIS — Z79890 Hormone replacement therapy (postmenopausal): Secondary | ICD-10-CM

## 2023-04-03 DIAGNOSIS — E1122 Type 2 diabetes mellitus with diabetic chronic kidney disease: Secondary | ICD-10-CM | POA: Diagnosis present

## 2023-04-03 DIAGNOSIS — Z8249 Family history of ischemic heart disease and other diseases of the circulatory system: Secondary | ICD-10-CM

## 2023-04-03 DIAGNOSIS — K31819 Angiodysplasia of stomach and duodenum without bleeding: Secondary | ICD-10-CM | POA: Diagnosis present

## 2023-04-03 DIAGNOSIS — K5520 Angiodysplasia of colon without hemorrhage: Secondary | ICD-10-CM

## 2023-04-03 DIAGNOSIS — Z7985 Long-term (current) use of injectable non-insulin antidiabetic drugs: Secondary | ICD-10-CM

## 2023-04-03 DIAGNOSIS — Z7982 Long term (current) use of aspirin: Secondary | ICD-10-CM

## 2023-04-03 DIAGNOSIS — I2510 Atherosclerotic heart disease of native coronary artery without angina pectoris: Secondary | ICD-10-CM | POA: Diagnosis present

## 2023-04-03 DIAGNOSIS — K644 Residual hemorrhoidal skin tags: Secondary | ICD-10-CM | POA: Diagnosis present

## 2023-04-03 DIAGNOSIS — Z880 Allergy status to penicillin: Secondary | ICD-10-CM

## 2023-04-03 DIAGNOSIS — Z79899 Other long term (current) drug therapy: Secondary | ICD-10-CM

## 2023-04-03 DIAGNOSIS — E669 Obesity, unspecified: Secondary | ICD-10-CM | POA: Diagnosis present

## 2023-04-03 DIAGNOSIS — E039 Hypothyroidism, unspecified: Secondary | ICD-10-CM | POA: Diagnosis present

## 2023-04-03 DIAGNOSIS — Z7902 Long term (current) use of antithrombotics/antiplatelets: Secondary | ICD-10-CM

## 2023-04-03 DIAGNOSIS — E785 Hyperlipidemia, unspecified: Secondary | ICD-10-CM | POA: Diagnosis present

## 2023-04-03 DIAGNOSIS — Z794 Long term (current) use of insulin: Secondary | ICD-10-CM

## 2023-04-03 DIAGNOSIS — Z882 Allergy status to sulfonamides status: Secondary | ICD-10-CM

## 2023-04-03 DIAGNOSIS — K648 Other hemorrhoids: Secondary | ICD-10-CM | POA: Diagnosis present

## 2023-04-03 DIAGNOSIS — K5730 Diverticulosis of large intestine without perforation or abscess without bleeding: Secondary | ICD-10-CM | POA: Diagnosis present

## 2023-04-03 DIAGNOSIS — D649 Anemia, unspecified: Principal | ICD-10-CM | POA: Diagnosis present

## 2023-04-03 DIAGNOSIS — I252 Old myocardial infarction: Secondary | ICD-10-CM

## 2023-04-03 DIAGNOSIS — E118 Type 2 diabetes mellitus with unspecified complications: Secondary | ICD-10-CM

## 2023-04-03 DIAGNOSIS — E7800 Pure hypercholesterolemia, unspecified: Secondary | ICD-10-CM | POA: Diagnosis present

## 2023-04-03 DIAGNOSIS — I130 Hypertensive heart and chronic kidney disease with heart failure and stage 1 through stage 4 chronic kidney disease, or unspecified chronic kidney disease: Secondary | ICD-10-CM | POA: Diagnosis present

## 2023-04-03 DIAGNOSIS — Z89432 Acquired absence of left foot: Secondary | ICD-10-CM

## 2023-04-03 DIAGNOSIS — I5033 Acute on chronic diastolic (congestive) heart failure: Secondary | ICD-10-CM | POA: Diagnosis present

## 2023-04-03 DIAGNOSIS — I350 Nonrheumatic aortic (valve) stenosis: Secondary | ICD-10-CM

## 2023-04-03 DIAGNOSIS — D125 Benign neoplasm of sigmoid colon: Secondary | ICD-10-CM | POA: Diagnosis present

## 2023-04-03 DIAGNOSIS — Z800 Family history of malignant neoplasm of digestive organs: Secondary | ICD-10-CM

## 2023-04-03 DIAGNOSIS — I739 Peripheral vascular disease, unspecified: Secondary | ICD-10-CM | POA: Diagnosis present

## 2023-04-03 DIAGNOSIS — I890 Lymphedema, not elsewhere classified: Secondary | ICD-10-CM | POA: Diagnosis present

## 2023-04-03 DIAGNOSIS — Z888 Allergy status to other drugs, medicaments and biological substances status: Secondary | ICD-10-CM

## 2023-04-03 LAB — CBC WITH DIFFERENTIAL/PLATELET
Abs Immature Granulocytes: 0.05 10*3/uL (ref 0.00–0.07)
Basophils Absolute: 0 10*3/uL (ref 0.0–0.1)
Basophils Relative: 0 %
Eosinophils Absolute: 0.1 10*3/uL (ref 0.0–0.5)
Eosinophils Relative: 1 %
HCT: 22.3 % — ABNORMAL LOW (ref 36.0–46.0)
Hemoglobin: 6.5 g/dL — CL (ref 12.0–15.0)
Immature Granulocytes: 1 %
Lymphocytes Relative: 15 %
Lymphs Abs: 1.5 10*3/uL (ref 0.7–4.0)
MCH: 24.9 pg — ABNORMAL LOW (ref 26.0–34.0)
MCHC: 29.1 g/dL — ABNORMAL LOW (ref 30.0–36.0)
MCV: 85.4 fL (ref 80.0–100.0)
Monocytes Absolute: 0.5 10*3/uL (ref 0.1–1.0)
Monocytes Relative: 5 %
Neutro Abs: 7.9 10*3/uL — ABNORMAL HIGH (ref 1.7–7.7)
Neutrophils Relative %: 78 %
Platelets: 407 10*3/uL — ABNORMAL HIGH (ref 150–400)
RBC: 2.61 MIL/uL — ABNORMAL LOW (ref 3.87–5.11)
RDW: 16.5 % — ABNORMAL HIGH (ref 11.5–15.5)
WBC: 10 10*3/uL (ref 4.0–10.5)
nRBC: 0 % (ref 0.0–0.2)

## 2023-04-03 LAB — IRON AND TIBC
Iron: 26 ug/dL — ABNORMAL LOW (ref 28–170)
Saturation Ratios: 12 % (ref 10.4–31.8)
TIBC: 214 ug/dL — ABNORMAL LOW (ref 250–450)
UIBC: 188 ug/dL

## 2023-04-03 LAB — COMPREHENSIVE METABOLIC PANEL
ALT: 9 U/L (ref 0–44)
AST: 9 U/L — ABNORMAL LOW (ref 15–41)
Albumin: 2.4 g/dL — ABNORMAL LOW (ref 3.5–5.0)
Alkaline Phosphatase: 105 U/L (ref 38–126)
Anion gap: 10 (ref 5–15)
BUN: 26 mg/dL — ABNORMAL HIGH (ref 6–20)
CO2: 23 mmol/L (ref 22–32)
Calcium: 8.4 mg/dL — ABNORMAL LOW (ref 8.9–10.3)
Chloride: 101 mmol/L (ref 98–111)
Creatinine, Ser: 1.91 mg/dL — ABNORMAL HIGH (ref 0.44–1.00)
GFR, Estimated: 30 mL/min — ABNORMAL LOW (ref >60)
Glucose, Bld: 142 mg/dL — ABNORMAL HIGH (ref 70–99)
Potassium: 3.7 mmol/L (ref 3.5–5.1)
Sodium: 134 mmol/L — ABNORMAL LOW (ref 135–145)
Total Bilirubin: 0.5 mg/dL (ref <1.2)
Total Protein: 7 g/dL (ref 6.5–8.1)

## 2023-04-03 LAB — FERRITIN: Ferritin: 67 ng/mL (ref 11–307)

## 2023-04-03 LAB — LACTATE DEHYDROGENASE: LDH: 109 U/L (ref 98–192)

## 2023-04-03 LAB — TROPONIN I (HIGH SENSITIVITY)
Troponin I (High Sensitivity): 34 ng/L — ABNORMAL HIGH (ref <18)
Troponin I (High Sensitivity): 37 ng/L — ABNORMAL HIGH (ref <18)

## 2023-04-03 LAB — PREPARE RBC (CROSSMATCH)

## 2023-04-03 LAB — CBG MONITORING, ED
Glucose-Capillary: 121 mg/dL — ABNORMAL HIGH (ref 70–99)
Glucose-Capillary: 115 mg/dL — ABNORMAL HIGH (ref 70–99)

## 2023-04-03 LAB — BRAIN NATRIURETIC PEPTIDE: B Natriuretic Peptide: 1033.9 pg/mL — ABNORMAL HIGH (ref 0.0–100.0)

## 2023-04-03 MED ORDER — PANTOPRAZOLE SODIUM 40 MG PO TBEC
40.0000 mg | DELAYED_RELEASE_TABLET | Freq: Every day | ORAL | Status: DC
  Administered 2023-04-04 – 2023-04-07 (×4): 40 mg via ORAL
  Filled 2023-04-03 (×4): qty 1

## 2023-04-03 MED ORDER — POLYETHYLENE GLYCOL 3350 17 G PO PACK: 17.0000 g | PACK | Freq: Every day | ORAL | Status: DC | PRN

## 2023-04-03 MED ORDER — LEVOTHYROXINE SODIUM 75 MCG PO TABS
150.0000 ug | ORAL_TABLET | Freq: Every day | ORAL | Status: DC
Start: 2023-04-04 — End: 2023-04-07
  Administered 2023-04-04 – 2023-04-07 (×3): 150 ug via ORAL
  Filled 2023-04-03 (×3): qty 2

## 2023-04-03 MED ORDER — ENOXAPARIN SODIUM 40 MG/0.4ML IJ SOSY: 40.0000 mg | PREFILLED_SYRINGE | INTRAMUSCULAR | Status: DC

## 2023-04-03 MED ORDER — INSULIN ASPART 100 UNIT/ML IJ SOLN
0.0000 [IU] | Freq: Three times a day (TID) | INTRAMUSCULAR | Status: DC
  Administered 2023-04-03 – 2023-04-04 (×2): 2 [IU] via SUBCUTANEOUS
  Administered 2023-04-06: 3 [IU] via SUBCUTANEOUS
  Administered 2023-04-07: 2 [IU] via SUBCUTANEOUS

## 2023-04-03 MED ORDER — SODIUM CHLORIDE 0.9% FLUSH
3.0000 mL | Freq: Two times a day (BID) | INTRAVENOUS | Status: DC
  Administered 2023-04-03 – 2023-04-07 (×7): 3 mL via INTRAVENOUS

## 2023-04-03 MED ORDER — SODIUM CHLORIDE 0.9% IV SOLUTION
Freq: Once | INTRAVENOUS | Status: AC
Start: 2023-04-03 — End: 2023-04-03

## 2023-04-03 MED ORDER — CLOPIDOGREL BISULFATE 75 MG PO TABS
75.0000 mg | ORAL_TABLET | Freq: Every day | ORAL | Status: DC
  Administered 2023-04-04 – 2023-04-07 (×4): 75 mg via ORAL
  Filled 2023-04-03 (×4): qty 1

## 2023-04-03 MED ORDER — GABAPENTIN 300 MG PO CAPS
300.0000 mg | ORAL_CAPSULE | Freq: Every day | ORAL | Status: DC
  Administered 2023-04-03 – 2023-04-06 (×4): 300 mg via ORAL
  Filled 2023-04-03 (×4): qty 1

## 2023-04-03 MED ORDER — ASPIRIN 81 MG PO TBEC
81.0000 mg | DELAYED_RELEASE_TABLET | Freq: Every day | ORAL | Status: DC
  Administered 2023-04-04 – 2023-04-07 (×4): 81 mg via ORAL
  Filled 2023-04-03 (×4): qty 1

## 2023-04-03 MED ORDER — ACETAMINOPHEN 650 MG RE SUPP: 650.0000 mg | Freq: Four times a day (QID) | RECTAL | Status: DC | PRN

## 2023-04-03 MED ORDER — INSULIN GLARGINE-YFGN 100 UNIT/ML ~~LOC~~ SOLN
10.0000 [IU] | Freq: Every day | SUBCUTANEOUS | Status: DC
  Administered 2023-04-03 – 2023-04-06 (×3): 10 [IU] via SUBCUTANEOUS
  Filled 2023-04-03 (×5): qty 0.1

## 2023-04-03 MED ORDER — ATORVASTATIN CALCIUM 40 MG PO TABS
40.0000 mg | ORAL_TABLET | Freq: Every day | ORAL | Status: DC
  Administered 2023-04-04 – 2023-04-07 (×4): 40 mg via ORAL
  Filled 2023-04-03 (×4): qty 1

## 2023-04-03 MED ORDER — ACETAMINOPHEN 325 MG PO TABS: 650.0000 mg | ORAL_TABLET | Freq: Four times a day (QID) | ORAL | Status: DC | PRN

## 2023-04-03 NOTE — ED Provider Notes (Signed)
[place] EMERGENCY DEPARTMENT AT [facility] Provider Note   CSN: [id number] Arrival date & time: 04/03/23  4098     History  Chief Complaint  Patient presents with   Weakness   Shortness of Breath   Dizziness    Kayla Schmitt is a 57 y.o. female.  57 yo F with a chief complaints of shortness of breath on exertion.  This been going on for a couple weeks.  She tried to make an appointment with a cardiologist and actually had one scheduled for today but felt like symptoms got worse and so came in for evaluation.  She denies any chest pressure denies diaphoresis.  She said when she walks into work she has to stop about halfway and take a break.  As she sitting and resting she feels fine but when she gets up to walk she does not feel well.  She denies cough congestion or fever.  A couple weeks ago she had nausea vomiting and diarrhea and just feels like she has not been able to recover since then.   Weakness Associated symptoms: dizziness and shortness of breath   Shortness of Breath Dizziness Associated symptoms: shortness of breath and weakness        Home Medications Prior to Admission medications   Medication Sig Start Date End Date Taking? Authorizing Provider  acetaminophen (TYLENOL) 500 MG tablet Take 1,000 mg by mouth 2 (two) times daily as needed for moderate pain (pain score 4-6), headache or fever.   Yes [provider]  amLODipine (NORVASC) 10 MG tablet Take 10 mg by mouth daily.   Yes [provider]  aspirin 81 MG EC tablet Take 81 mg by mouth daily.   Yes [provider]  atorvastatin (LIPITOR) 40 MG tablet Take 40 mg by mouth daily. 12/14/19  Yes [provider]  clopidogrel (PLAVIX) 75 MG tablet TAKE 1 TABLET BY MOUTH EVERY DAY WITH BREAKFAST 01/28/23  Yes Maeola Harman, MD  diphenhydrAMINE HCl, Sleep, (ZZZQUIL) 25 MG CAPS Take 25 mg by mouth at bedtime as needed (Sleep).   Yes [provider]  furosemide (LASIX) 40 MG tablet Take 1 tablet (40 mg total) by mouth 2 (two) times daily. 08/27/22  Yes Willeen Niece, MD  gabapentin (NEURONTIN) 300 MG capsule Take 300 mg by mouth at bedtime.   Yes [provider]  insulin degludec (TRESIBA FLEXTOUCH) 100 UNIT/ML FlexTouch Pen Inject 10 Units into the skin at bedtime. Patient taking differently: Inject 16 Units into the skin at bedtime. 01/15/20  Yes Danford, Earl Lites, MD  levothyroxine (SYNTHROID) 150 MCG tablet Take 150 mcg by mouth daily before breakfast. 12/02/19  Yes [provider]  lisinopril (ZESTRIL) 2.5 MG tablet Take 1 tablet (2.5 mg total) by mouth daily. 11/06/22  Yes Mallipeddi, Vishnu P, MD  metoprolol succinate (TOPROL-XL) 25 MG 24 hr tablet Take 0.5 tablets (12.5 mg total) by mouth daily. 08/28/22  Yes Willeen Niece, MD  nitroGLYCERIN (NITROSTAT) 0.4 MG SL tablet Place 1 tablet (0.4 mg total) under the tongue every 5 (five) minutes x 3 doses as needed for chest pain (if no relief after 3rd dose proceed to ED or call 911). 11/06/2022-New 11/06/22  Yes Mallipeddi, Vishnu P, MD  oxyCODONE-acetaminophen (PERCOCET/ROXICET) 5-325 MG tablet Take 1 tablet by mouth every 6 (six) hours as needed for severe pain (pain score 7-10). 03/22/23  Yes Terrilee Files, MD  pantoprazole (PROTONIX) 40 MG tablet Take 40 mg by mouth daily. 12/21/19  Yes [provider]  promethazine (PHENERGAN) 25 MG tablet Take 1 tablet (25 mg total) by mouth every 6 (six) hours as needed for nausea or vomiting. 03/22/23  Yes Terrilee Files, MD  sulfamethoxazole-trimethoprim (BACTRIM DS) 800-160 MG tablet Take 1 tablet by mouth 2 (two) times daily. Patient not taking: Reported on 04/03/2023 03/18/23   Nadara Mustard, MD      Allergies    Bactrim ds [sulfamethoxazole-trimethoprim], Ozempic (0.25 or 0.5 mg-dose) [semaglutide(0.25 or 0.5mg -dos)], Trental [pentoxifylline], Vibramycin [doxycycline], and Penicillins    Review  of Systems   Review of Systems  Respiratory:  Positive for shortness of breath.   Neurological:  Positive for dizziness and weakness.    Physical Exam Updated Vital Signs BP (!) 91/58 (BP Location: Left Arm)   Pulse 77   Temp 99.1 F (37.3 C) (Oral)   Resp 17   SpO2 100%  Physical Exam Vitals and nursing note reviewed.  Constitutional:      General: She is not in acute distress.    Appearance: She is well-developed. She is not diaphoretic.  HENT:     Head: Normocephalic and atraumatic.  Eyes:     Pupils: Pupils are equal, round, and reactive to light.  Cardiovascular:     Rate and Rhythm: Normal rate and regular rhythm.     Heart sounds: No murmur heard.    No friction rub. No gallop.  Pulmonary:     Effort: Pulmonary effort is normal.     Breath sounds: No wheezing or rales.  Abdominal:     General: There is no distension.     Palpations: Abdomen is soft.     Tenderness: There is no abdominal tenderness.  Musculoskeletal:        General: No tenderness.     Cervical back: Normal range of motion and neck supple.  Skin:    General: Skin is warm and dry.  Neurological:     Mental Status: She is alert and oriented to person, place, and time.  Psychiatric:        Behavior: Behavior normal.     ED Results / Procedures / Treatments   Labs (all labs ordered are listed, but only abnormal results are displayed) Labs Reviewed  CBC WITH DIFFERENTIAL/PLATELET - Abnormal; Notable for the following components:      Result Value   RBC 2.61 (*)    Hemoglobin 6.5 (*)    HCT 22.3 (*)    MCH 24.9 (*)    MCHC 29.1 (*)    RDW 16.5 (*)    Platelets 407 (*)    Neutro Abs 7.9 (*)    All other components within normal limits  COMPREHENSIVE METABOLIC PANEL - Abnormal; Notable for the following components:   Sodium 134 (*)    Glucose, Bld 142 (*)    BUN 26 (*)    Creatinine, Ser 1.91 (*)    Calcium 8.4 (*)    Albumin 2.4 (*)    AST 9 (*)    GFR, Estimated 30 (*)    All other  components within normal limits  BRAIN NATRIURETIC PEPTIDE - Abnormal; Notable for the following components:   B Natriuretic Peptide 1,033.9 (*)    All other components within normal limits  TROPONIN I (HIGH SENSITIVITY) - Abnormal; Notable for the following components:   Troponin I (High Sensitivity) 34 (*)    All other components within normal limits  TYPE AND SCREEN  PREPARE RBC (CROSSMATCH)    EKG EKG  Interpretation Date/Time:  Wednesday April 03 2023 08:49:37 EST Ventricular Rate:  82 PR Interval:  188 QRS Duration:  114 QT Interval:  430 QTC Calculation: 502 R Axis:   20  Text Interpretation: Normal sinus rhythm Minimal voltage criteria for LVH, may be normal variant ( Cornell product ) Anterior infarct , age undetermined ST & T wave abnormality, consider lateral ischemia Prolonged QT Abnormal ECG No significant change since last tracing Confirmed by Melene Plan [phone]) on 04/03/2023 11:21:25 AM  Radiology DG Chest Port 1 View  Result Date: 04/03/2023 CLINICAL DATA:  Short of breath. EXAM: PORTABLE CHEST 1 VIEW COMPARISON:  08/18/2022. FINDINGS: Cardiac silhouette normal in size.  No mediastinal or hilar masses. There is vascular congestion and bilateral interstitial thickening most evident in the lower lungs. Hazy opacity at the lung bases suggests small effusions. No pneumothorax. Skeletal structures are grossly intact. IMPRESSION: 1. Interstitial thickening and small effusions. Suspect interstitial pulmonary edema. No convincing pneumonia. . Electronically Signed   By: Amie Portland M.D.   On: 04/03/2023 10:00    Procedures .Critical Care  Performed by: Melene Plan, DO Authorized by: Melene Plan, DO   Critical care provider statement:    Critical care time (minutes):  35   Critical care time was exclusive of:  Separately billable procedures and treating other patients   Critical care was time spent personally by me on the following activities:  Development of treatment  plan with patient or surrogate, discussions with consultants, evaluation of patient's response to treatment, examination of patient, ordering and review of laboratory studies, ordering and review of radiographic studies, ordering and performing treatments and interventions, pulse oximetry, re-evaluation of patient's condition and review of old charts   Care discussed with: admitting provider       Medications Ordered in ED Medications  0.9 %  sodium chloride infusion (Manually program via Guardrails IV Fluids) ( Intravenous New Bag/Given 04/03/23 1224)    ED Course/ Medical Decision Making/ A&P                                 Medical Decision Making Amount and/or Complexity of Data Reviewed Labs: ordered. Radiology: ordered.  Risk Prescription drug management.   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  This been going on for a couple weeks.  On my record review the patient had a cardiac catheterization done in April of this year that was concerning for multivessel disease.  Per the patient coronary artery bypass surgery was deferred due to her significant peripheral vascular disease.   Patient with a hemoglobin of 6.5.  Will transfuse a unit.  She denies any areas of bleeding.  Denies any dark stool or blood in her stool.  With her preceding illness denied any bleeding.  I did discuss the case with cardiology will formally consult.  Will discuss with medicine for admission.  The patients results and plan were reviewed and discussed.   Any x-rays performed were independently reviewed by myself.   Differential diagnosis were considered with the presenting HPI.  Medications  0.9 %  sodium chloride infusion (Manually program via Guardrails IV Fluids) ( Intravenous New Bag/Given 04/03/23 1224)    Vitals:   04/03/23 1105 04/03/23  1209 04/03/23 1215 04/03/23 1230  BP: 94/83 Marland Kitchen)  89/55 (!) 89/55 (!) 91/58  Pulse: 81 78 78 77  Resp: 20 16 16 17   Temp:  98.7 F (37.1 C) 98.7 F (37.1 C) 99.1 F (37.3 C)  TempSrc:  Oral Oral Oral  SpO2: 100% 100%  100%    Final diagnoses:  Symptomatic anemia    Admission/ observation were discussed with the admitting physician, patient and/or family and they are comfortable with the plan.           Final Clinical Impression(s) / ED Diagnoses Final diagnoses:  Symptomatic anemia    Rx / DC Orders ED Discharge Orders     None         Melene Plan, DO 04/03/23 1311

## 2023-04-03 NOTE — Consult Note (Addendum)
Cardiology Consultation   Patient ID: Kayla Schmitt MRN: [id number]; DOB: 1965/12/31  Admit date: 04/03/2023 Date of Consult: 04/03/2023  PCP:  [facility]   [facility] Providers Cardiologist:  Marjo Bicker, MD        Patient Profile:   Kayla Schmitt is a 57 y.o. female with a hx of NSTEMI 07/2022 w/ severe 3v dz and mod AS >> TCTS rec med rx due to ongoing PAD issues. S/p R SFA mechanical orbital atherectomy and balloon angioplasty 2021. Right SFA stent placement. Right above-knee popliteal artery angioplasty, bilateral transmetatarsal amputation 2024, DM2, HTN, HLD, who is being seen 04/03/2023 for the evaluation of DOE (?anginal equivalent) at the request of Dr Adela Lank.  History of Present Illness:   [facility]la Schmitt [facility] to [facility] 11/22 with 2 days N&V, had been on Bactrim x 5 days and restarted Wegovy 3 days ago. Had labs, CT, symptomatic rx. No acute issues, found sx improved w/ treatment >> d/c.   12/02, pt called the office w/ SOB w/ exertion and was given an appt 12/04 at 4pm w/ E. Philis Nettle, NP.  However, sx worsened and she came to the ER.  On arrival to the ER, her BUN was 26 with creatinine 1.91, hemoglobin 6.5 with hematocrit 22.3.  Other CBC values were abnormal as well but her MCV was normal at 85.4.  BNP 1033, troponin 34  Currently, she is being transfused.  She has not been short of breath at rest.  She has been having problems with dyspnea on exertion that has been going on for at least a couple of weeks.  The symptoms have been gradually getting worse.  She has not had chest pain, but then she has never had chest pain.  She has not thought about the dyspnea on exertion being her anginal equivalent.  During her recent GI illness, she was not able to keep anything down, so for a couple of days, did not take her medications, including Lasix.  She did not see any blood in the vomitus, did not have any diarrhea.  She has not  had black tarry stools and has not seen any blood in her stools.  Yesterday Dr. Lajoyce Corners applied a compression stocking to her leg to help with the swelling.  She has had some swelling in that leg ever since the surgery, more so than the other 1.  Currently she is resting comfortably, on O2 and being transfused.  Prior to the onset of her current symptoms, she was doing better, walking more and being more active without any problems.   Past Medical History:  Diagnosis Date   Anemia    Aortic stenosis    Arthritis    CHF (congestive heart failure) (HCC)    Coronary artery disease    Diabetes mellitus without complication (HCC)    type 2   GERD (gastroesophageal reflux disease)    Heart murmur    History of blood transfusion    Hypercholesteremia    Hypertension    Hypothyroidism    Mitral regurgitation    Myocardial infarction (HCC)    Peripheral vascular disease (HCC)    PONV (postoperative nausea and vomiting)    Thyroid disease     Past Surgical History:  Procedure Laterality Date   ABDOMINAL AORTOGRAM W/LOWER EXTREMITY Bilateral 01/14/2020   Procedure: ABDOMINAL AORTOGRAM W/LOWER EXTREMITY;  Surgeon: Cephus Shelling, MD;  Location: [facility];  Service: Cardiovascular;  Laterality: Bilateral;  ABDOMINAL AORTOGRAM W/LOWER EXTREMITY N/A 07/05/2022   Procedure: ABDOMINAL AORTOGRAM W/LOWER EXTREMITY;  Surgeon: Cephus Shelling, MD;  Location: [facility];  Service: Cardiovascular;  Laterality: N/A;   ABDOMINAL AORTOGRAM W/LOWER EXTREMITY N/A 08/02/2022   Procedure: ABDOMINAL AORTOGRAM W/LOWER EXTREMITY;  Surgeon: Cephus Shelling, MD;  Location: [facility];  Service: Cardiovascular;  Laterality: N/A;   ABDOMINAL AORTOGRAM W/LOWER EXTREMITY N/A 08/06/2022   Procedure: ABDOMINAL AORTOGRAM W/LOWER EXTREMITY;  Surgeon: Cephus Shelling, MD;  Location: [facility];  Service: Cardiovascular;  Laterality: N/A;   AMPUTATION Bilateral 07/20/2022    Procedure: AMPUTATION RIGHT GREAT TOE AND SECOND TOE, AMPUTATION LEFT GREAT TOE;  Surgeon: Nadara Mustard, MD;  Location: [facility];  Service: Orthopedics;  Laterality: Bilateral;   AMPUTATION Bilateral 08/08/2022   Procedure: BILATERAL TRANSMETATARSAL AMPUTATION;  Surgeon: Nadara Mustard, MD;  Location: [facility] OR;  Service: Orthopedics;  Laterality: Bilateral;   AMPUTATION TOE     PERIPHERAL VASCULAR ATHERECTOMY Right 01/14/2020   Procedure: PERIPHERAL VASCULAR ATHERECTOMY;  Surgeon: Cephus Shelling, MD;  Location: [facility] INVASIVE CV LAB;  Service: Cardiovascular;  Laterality: Right;  sfa   PERIPHERAL VASCULAR INTERVENTION Right 01/14/2020   Procedure: PERIPHERAL VASCULAR INTERVENTION;  Surgeon: Cephus Shelling, MD;  Location: [facility];  Service: Cardiovascular;  Laterality: Right;  sfa stent    PERIPHERAL VASCULAR INTERVENTION  07/05/2022   Procedure: PERIPHERAL VASCULAR INTERVENTION;  Surgeon: Cephus Shelling, MD;  Location: [facility];  Service: Cardiovascular;;   PERIPHERAL VASCULAR INTERVENTION  08/02/2022   Procedure: PERIPHERAL VASCULAR INTERVENTION;  Surgeon: Cephus Shelling, MD;  Location: [facility];  Service: Cardiovascular;;   PERIPHERAL VASCULAR INTERVENTION  08/06/2022   Procedure: PERIPHERAL VASCULAR INTERVENTION;  Surgeon: Cephus Shelling, MD;  Location: [facility] INVASIVE CV LAB;  Service: Cardiovascular;;   PERIPHERAL VASCULAR THROMBECTOMY  08/02/2022   Procedure: PERIPHERAL VASCULAR THROMBECTOMY;  Surgeon: Cephus Shelling, MD;  Location: [facility];  Service: Cardiovascular;;   RIGHT/LEFT HEART CATH AND CORONARY ANGIOGRAPHY N/A 08/23/2022   Procedure: RIGHT/LEFT HEART CATH AND CORONARY ANGIOGRAPHY;  Surgeon: [name], MD;  Location: [facility] INVASIVE CV LAB;  Service: Cardiovascular;  Laterality: N/A;   SKIN SPLIT GRAFT Right 03/18/2020   Procedure: SKIN GRAFTING RIGHT FOOT ULCER;  Surgeon: Nadara Mustard, MD;  Location: [facility] OR;  Service:  Orthopedics;  Laterality: Right;   STUMP REVISION Right 11/30/2022   Procedure: REVISION RIGHT TRANSMETATARSAL AMPUTATION;  Surgeon: Nadara Mustard, MD;  Location: [facility] OR;  Service: Orthopedics;  Laterality: Right;   TEE WITHOUT CARDIOVERSION N/A 08/27/2022   Procedure: TRANSESOPHAGEAL ECHOCARDIOGRAM;  Surgeon: [name], MD;  Location: [facility] INVASIVE CV LAB;  Service: Cardiovascular;  Laterality: N/A;   WISDOM TOOTH EXTRACTION       Home Medications:  Prior to Admission medications   Medication Sig Start Date End Date Taking? Authorizing Provider  acetaminophen (TYLENOL) 500 MG tablet Take 1,000 mg by mouth 2 (two) times daily as needed for moderate pain (pain score 4-6), headache or fever.   Yes [provider]  amLODipine (NORVASC) 10 MG tablet Take 10 mg by mouth daily.   Yes [provider]  aspirin 81 MG EC tablet Take 81 mg by mouth daily.   Yes [provider]  atorvastatin (LIPITOR) 40 MG tablet Take 40 mg by mouth daily. 12/14/19  Yes [provider]  clopidogrel (PLAVIX) 75 MG tablet TAKE 1 TABLET BY MOUTH  EVERY DAY WITH BREAKFAST 01/28/23  Yes Maeola Harman, MD  diphenhydrAMINE HCl, Sleep, (ZZZQUIL) 25 MG CAPS Take 25 mg by mouth at bedtime as needed (Sleep).   Yes [provider]  furosemide (LASIX) 40 MG tablet Take 1 tablet (40 mg total) by mouth 2 (two) times daily. 08/27/22  Yes Willeen Niece, MD  gabapentin (NEURONTIN) 300 MG capsule Take 300 mg by mouth at bedtime.   Yes [provider]  insulin degludec (TRESIBA FLEXTOUCH) 100 UNIT/ML FlexTouch Pen Inject 10 Units into the skin at bedtime. Patient taking differently: Inject 16 Units into the skin at bedtime. 01/15/20  Yes Danford, Earl Lites, MD  levothyroxine (SYNTHROID) 150 MCG tablet Take 150 mcg by mouth daily before breakfast. 12/02/19  Yes [provider]  lisinopril (ZESTRIL) 2.5 MG tablet Take 1 tablet (2.5 mg total) by mouth daily. 11/06/22   Yes Mallipeddi, Vishnu P, MD  metoprolol succinate (TOPROL-XL) 25 MG 24 hr tablet Take 0.5 tablets (12.5 mg total) by mouth daily. 08/28/22  Yes Willeen Niece, MD  nitroGLYCERIN (NITROSTAT) 0.4 MG SL tablet Place 1 tablet (0.4 mg total) under the tongue every 5 (five) minutes x 3 doses as needed for chest pain (if no relief after 3rd dose proceed to ED or call 911). 11/06/2022-New 11/06/22  Yes Mallipeddi, Vishnu P, MD  oxyCODONE-acetaminophen (PERCOCET/ROXICET) 5-325 MG tablet Take 1 tablet by mouth every 6 (six) hours as needed for severe pain (pain score 7-10). 03/22/23  Yes Terrilee Files, MD  pantoprazole (PROTONIX) 40 MG tablet Take 40 mg by mouth daily. 12/21/19  Yes [provider]  promethazine (PHENERGAN) 25 MG tablet Take 1 tablet (25 mg total) by mouth every 6 (six) hours as needed for nausea or vomiting. 03/22/23  Yes Terrilee Files, MD  sulfamethoxazole-trimethoprim (BACTRIM DS) 800-160 MG tablet Take 1 tablet by mouth 2 (two) times daily. Patient not taking: Reported on 04/03/2023 03/18/23   Nadara Mustard, MD    Inpatient Medications: Scheduled Meds:  Continuous Infusions:  PRN Meds:   Allergies:    Allergies  Allergen Reactions   Bactrim Ds [Sulfamethoxazole-Trimethoprim] Nausea Only and Other (See Comments)    Severe stomach discomfort   Ozempic (0.25 Or 0.5 Mg-Dose) [Semaglutide(0.25 Or 0.5mg -Dos)] Other (See Comments)    Severe stomach discomfort   Trental [Pentoxifylline] Nausea And Vomiting   Vibramycin [Doxycycline] Nausea And Vomiting   Penicillins Rash    Tolerated cephalosporins 07/2022     Social History:   Social History   Socioeconomic History   Marital status: Divorced    Spouse name: Not on file   Number of children: 0   Years of education: Not on file   Highest education level: Not on file  Occupational History   Not on file  Tobacco Use   Smoking status: Former    Current packs/day: 0.00    Types: Cigarettes    Quit date:  01/2017    Years since quitting: 6.1   Smokeless tobacco: Never  Vaping Use   Vaping status: Never Used  Substance and Sexual Activity   Alcohol use: Not Currently   Drug use: Yes    Types: Marijuana    Comment: on ocassion- Last time - 11/13/22   Sexual activity: Yes    Birth control/protection: Post-menopausal  Other Topics Concern   Not on file  Social History Narrative   Not on file   Social Determinants of Health   Financial Resource Strain: Not on file  Food Insecurity:  No Food Insecurity (08/20/2022)   Hunger Vital Sign    Worried About Running Out of Food in the Last Year: Never true    Ran Out of Food in the Last Year: Never true  Transportation Needs: No Transportation Needs (08/20/2022)   PRAPARE - Administrator, Civil Service (Medical): No    Lack of Transportation (Non-Medical): No  Physical Activity: Not on file  Stress: Not on file  Social Connections: Not on file  Intimate Partner Violence: Not At Risk (08/20/2022)   Humiliation, Afraid, Rape, and Kick questionnaire    Fear of Current or Ex-Partner: No    Emotionally Abused: No    Physically Abused: No    Sexually Abused: No    Family History:   Family History  Problem Relation Age of Onset   Diabetes Other    CAD Other      ROS:  Please see the history of present illness.  All other ROS reviewed and negative.     Physical Exam/Data:   Vitals:   04/03/23 1105 04/03/23 1209 04/03/23 1215 04/03/23 1230  BP: 94/83 (!) 89/55 (!) 89/55 (!) 91/58  Pulse: 81 78 78 77  Resp: 20 16 16 17   Temp:  98.7 F (37.1 C) 98.7 F (37.1 C) 99.1 F (37.3 C)  TempSrc:  Oral Oral Oral  SpO2: 100% 100%  100%   No intake or output data in the 24 hours ending 04/03/23 1310    03/26/2023   11:46 AM 01/01/2023    3:32 PM 11/30/2022    5:54 AM  Last 3 Weights  Weight (lbs) 191 lb 9.6 oz 200 lb 9.6 oz 200 lb  Weight (kg) 86.909 kg 90.992 kg 90.719 kg     There is no height or weight on file to calculate  BMI.  General:  Well nourished, well developed, in no acute distress HEENT: normal Neck: JVD 10 to 12 cm Vascular: No carotid bruits; radial pulses 2+ bilaterally Cardiac:  normal S1, S2; RRR; 2/6 murmur  Lungs: Rales bases bilaterally, no wheezing, rhonchi  Abd: soft, nontender, no hepatomegaly  Ext: 1-2+ edema, s/p bilateral transmetatarsal amputations Musculoskeletal:  No deformities, BUE and BLE strength normal and equal Skin: warm and dry  Neuro:  CNs 2-12 intact, no focal abnormalities noted Psych:  Normal affect   EKG:  The EKG was personally reviewed and demonstrates:  SR, HR 82, no sig change from 08/20/2022 Telemetry:  Telemetry was personally reviewed and demonstrates:  SR  Relevant CV Studies:  R ABI: 03/26/2023 Summary:  Right: 50-74% stenosis noted in the superficial femoral artery. Unable to appreciate SFA stent due to extensive calcification.   ECHO: 10/22/2022  1. Left ventricular ejection fraction, by estimation, is 50 to 55%. The left ventricle has low normal function. The left ventricle has no regional wall motion abnormalities. There is mild left ventricular hypertrophy. Left ventricular diastolic  parameters are consistent with Grade II diastolic dysfunction  (pseudonormalization). Elevated left atrial pressure.   2. Right ventricular systolic function is normal. The right ventricular size is normal. Tricuspid regurgitation signal is inadequate for assessing PA pressure.   3. The mitral valve is abnormal. Restricted posterior leaflet. Moderate mitral valve regurgitation. Eccentric MR jet, may be underestimating MR given eccentric jet but normal LA size and E=A wave argues against severe MR. Recent TEE showed moderate MR   4. The aortic valve is calcified. There is moderate calcification of the aortic valve. Aortic valve regurgitation is mild. Moderate  aortic valve stenosis. Vmax 2.9 m/s, MG 21 mmHg, AVA 1.2 cm^2, DI 0.30   5. The inferior vena cava is normal in  size with greater than 50% respiratory variability, suggesting right atrial pressure of 3 mmHg.   CARDIAC CATH: 08/23/2022   Ost LAD to Prox LAD lesion is 75% stenosed.   Ost Cx to Prox Cx lesion is 100% stenosed.   1st Diag lesion is 25% stenosed.   Mid RCA lesion is 70% stenosed.   Mid LM to Dist LM lesion is 40% stenosed.   Mid LAD lesion is 70% stenosed.   LV end diastolic pressure is moderately elevated.   Hemodynamic findings consistent with mild pulmonary hypertension.   There is no aortic valve stenosis.   Aortic saturation 91%, PA saturation 60%, mean RA pressure 9 mmHg, PA pressure 42/24, mean PA pressure 32 mmHg, mean pulmonary capillary wedge pressure 24 mmHg, V waves noted to 45 mmHg.  Cardiac output 8.58 L/min, cardiac index 4.26.   Severe, calcific three-vessel coronary artery disease.  Severe mitral regurgitation by noninvasive workup.  Significant V waves noted on the wedge tracing.  Will obtain cardiac surgery consult for CABG and mitral valve repair. Diagnostic Dominance: Right   Laboratory Data:  High Sensitivity Troponin:   Recent Labs  Lab 04/03/23 0905  TROPONINIHS 34*     Chemistry Recent Labs  Lab 04/03/23 0905  NA 134*  K 3.7  CL 101  CO2 23  GLUCOSE 142*  BUN 26*  CREATININE 1.91*  CALCIUM 8.4*  GFRNONAA 30*  ANIONGAP 10    Recent Labs  Lab 04/03/23 0905  PROT 7.0  ALBUMIN 2.4*  AST 9*  ALT 9  ALKPHOS 105  BILITOT 0.5   Lipids No results for input(s): "CHOL", "TRIG", "HDL", "LABVLDL", "LDLCALC", "CHOLHDL" in the last 168 hours.  Hematology Recent Labs  Lab 04/03/23 0905  WBC 10.0  RBC 2.61*  HGB 6.5*  HCT 22.3*  MCV 85.4  MCH 24.9*  MCHC 29.1*  RDW 16.5*  PLT 407*   Thyroid No results for input(s): "TSH", "FREET4" in the last 168 hours.  BNP Recent Labs  Lab 04/03/23 0905  BNP 1,033.9*    DDimer No results for input(s): "DDIMER" in the last 168 hours.   Radiology/Studies:  DG Chest Port 1 View  Result Date:  04/03/2023 CLINICAL DATA:  Short of breath. EXAM: PORTABLE CHEST 1 VIEW COMPARISON:  08/18/2022. FINDINGS: Cardiac silhouette normal in size.  No mediastinal or hilar masses. There is vascular congestion and bilateral interstitial thickening most evident in the lower lungs. Hazy opacity at the lung bases suggests small effusions. No pneumothorax. Skeletal structures are grossly intact. IMPRESSION: 1. Interstitial thickening and small effusions. Suspect interstitial pulmonary edema. No convincing pneumonia. . Electronically Signed   By: Amie Portland M.D.   On: 04/03/2023 10:00     Assessment and Plan:   SOB - believe it to be multifactorial, combination of CAD, valvular heart disease, CHF, and anemia  2.  CAD: -Prior to the onset of her GI illness, she was not having any ischemic symptoms.  She was ambulating well. -she has never had chest pain, but under the stress of her acute illness the shortness of breath may be an anginal equivalent. -Because she was doing so well, it was not felt that surgery was needed.  Additionally, her valve disease was not severe enough to warrant replacement -Last echo was 5 months ago, repeat -Once her general medical condition is improved, right/left heart  cath -For now, continue aspirin, statin, Plavix, Toprol-XL 12.5 mg daily and lisinopril 2.5 mg daily  3.  Valvular heart disease -At last echo, aortic stenosis was moderate, with a peak gradient of 31 -Recheck echo and follow  4.  CHF: -Although her BUN and creatinine likely reflect intravascular dehydration, her chest x-ray and BNP as well as physical exam findings indicate heart failure exacerbation -EF previously was low normal with grade 2 diastolic dysfunction -Recheck -Follow exam and lab findings, diuresis when able  5.  Anemia: -Iron profile in the April 2024 showed a normal iron level and MCV is also normal. -Will check an LDH and haptoglobin -Consider GI eval and/or hematology eval for this  anemia that has been persistent since 2021, but has been worse in 2024  Otherwise, per IM    Risk Assessment/Risk Scores:        New York Heart Association (NYHA) Functional Class NYHA Class IV   For questions or updates, please contact [facility] Please consult [email or web address] for contact info under    Signed, Theodore Demark, PA-C  04/03/2023 1:10 PM  Agree with note by Theodore Demark PA-C  We are asked to see this chronically ill 57 year old moderately overweight Caucasian female who lives in [place] for progressive dyspnea.  She has a history of remote tobacco abuse having quit 6 to 7 years ago, treated hypertension, diabetes and hyperlipidemia.  She does have a history of CAD status post cardiac catheterization 08/23/2022 revealing three-vessel disease was with an occluded nondominant circumflex.  Medical therapy is recommended.  She also has valvular heart disease with at least moderate aortic stenosis and mitral regurgitation of unclear severity although at cath her feet V wave was significantly elevated suggesting her MR may be more severe.  She has a history of PAD status post SFA intervention by Dr. Dr. Chestine Spore and subsequent bilateral transmetatarsal amputations by Dr. Lajoyce Corners.  She had upper GI illness characterized by nausea and vomiting which was self-limited.  After that she began to have more shortness of breath.  On presentation her hemoglobin is low in the 6 range.  She is currently getting transfused.  She has had chronic anemia since 2021 of unclear etiology.  Her creatinine is up to 1.9 with a baseline of 1.1.  Suspect that she is volume depleted although her BNP is elevated and she does have some basilar crackles.  I think that her dyspnea is multifactorial probably from coronary artery disease, valvular heart disease and severe anemia.  She is feeling somewhat improved with transfusion.  I think a transfusion goal should be between 9 and 10.  I suspect renal function will  gradually improve.  Once we have determine the etiology of her chronic anemia, we will need to further investigate her coronary and valvular heart disease.  I suspect she will need CABG with aortic plus or minus mitral valve replacement/repair.   Runell Gess, M.D., FACP, [facility], [facility] [address] [place], [place]  [address]  [phone] 04/03/2023 3:24 PM

## 2023-04-03 NOTE — Progress Notes (Signed)
Office Visit Note   Patient: Kayla Schmitt           Date of Birth: April 11, 1966           MRN: [id number] Visit Date: 04/02/2023              Requested by: Medicine, [facility] Internal [address] [place],  [place] [address] PCP: Medicine, [facility] Internal  Chief Complaint  Patient presents with   Right Foot - Follow-up      HPI: Patient is a 57 year old woman status post transmetatarsal amputation of the right who has had wound dehiscence has undergone wound care and has made excellent progress.  Assessment & Plan: Visit Diagnoses:  1. History of transmetatarsal amputation of foot (HCC)   2. Dehiscence of amputation stump of right lower extremity (HCC)     Plan: Patient was placed in an extra-large stump shrinker to be worn is a sock.  Recommended compression elevation and exercise.  Follow-Up Instructions: Return in about 4 weeks (around 04/30/2023).   Ortho Exam  Patient is alert, oriented, no adenopathy, well-dressed, normal affect, normal respiratory effort. Examination the transmetatarsal amputation is well-healed she does have venous and lymphatic swelling but no open ulcers.  No cellulitis.  Imaging: DG Chest Port 1 View  Result Date: 04/03/2023 CLINICAL DATA:  Short of breath. EXAM: PORTABLE CHEST 1 VIEW COMPARISON:  08/18/2022. FINDINGS: Cardiac silhouette normal in size.  No mediastinal or hilar masses. There is vascular congestion and bilateral interstitial thickening most evident in the lower lungs. Hazy opacity at the lung bases suggests small effusions. No pneumothorax. Skeletal structures are grossly intact. IMPRESSION: 1. Interstitial thickening and small effusions. Suspect interstitial pulmonary edema. No convincing pneumonia. . Electronically Signed   By: Amie Portland M.D.   On: 04/03/2023 10:00   No images are attached to the encounter.  Labs: Lab Results  Component Value Date   HGBA1C 6.4 (H) 08/02/2022   HGBA1C 11.0 (H) 01/11/2020   ESRSEDRATE 136 (H)  01/14/2020   ESRSEDRATE 126 (H) 01/13/2020   ESRSEDRATE 125 (H) 01/12/2020   CRP 11.3 (H) 01/14/2020   CRP 13.0 (H) 01/13/2020   CRP 15.8 (H) 01/12/2020   LABURIC 7.7 (H) 02/04/2022   REPTSTATUS 08/07/2022 FINAL 08/02/2022   CULT  08/02/2022    NO GROWTH 5 DAYS Performed at [facility], [address], [place], [place] [address]      Lab Results  Component Value Date   ALBUMIN 2.4 (L) 04/03/2023   ALBUMIN 2.6 (L) 03/22/2023   ALBUMIN 1.6 (L) 08/06/2022    Lab Results  Component Value Date   MG 1.9 08/25/2022   MG 1.7 08/24/2022   MG 1.7 08/23/2022   No results found for: "VD25OH"  No results found for: "PREALBUMIN"    Latest Ref Rng & Units 04/03/2023    9:05 AM 03/22/2023    7:02 AM 11/30/2022    5:53 AM  CBC EXTENDED  WBC 4.0 - 10.5 K/uL 10.0  9.2  7.7   RBC 3.87 - 5.11 MIL/uL 2.61  2.81  3.03   Hemoglobin 12.0 - 15.0 g/dL 6.5  7.4  8.9   HCT 82.9 - 46.0 % 22.3  23.9  27.1   Platelets 150 - 400 K/uL 407  370  208   NEUT# 1.7 - 7.7 K/uL 7.9     Lymph# 0.7 - 4.0 K/uL 1.5        There is no height or weight on file to calculate  BMI.  Orders:  No orders of the defined types were placed in this encounter.  No orders of the defined types were placed in this encounter.    Procedures: No procedures performed  Clinical Data: No additional findings.  ROS:  All other systems negative, except as noted in the HPI. Review of Systems  Objective: Vital Signs: There were no vitals taken for this visit.  Specialty Comments:  No specialty comments available.  PMFS History: Patient Active Problem List   Diagnosis Date Noted   Acute on chronic combined systolic (congestive) and diastolic (congestive) heart failure (HCC) 04/03/2023   Osteomyelitis of foot, right, acute (HCC) 12/12/2022   CAD (coronary artery disease) 11/06/2022   Aortic stenosis 11/06/2022   Mitral regurgitation 08/27/2022   NSTEMI (non-ST elevated myocardial infarction) (HCC)  08/18/2022   Acute clinical systolic heart failure (HCC) 08/18/2022   Acute blood loss anemia 08/18/2022   Anemia 08/18/2022   Critical limb ischemia of both lower extremities (HCC) 06/19/2022   DM type 2 (diabetes mellitus, type 2) (HCC) 01/28/2020   Hyperlipidemia 01/28/2020   Hypertension 01/28/2020   Hypothyroidism 01/28/2020   Obesity 01/28/2020   Ulcerated, foot, right, limited to breakdown of skin (HCC)    Cellulitis of right foot    Peripheral arterial disease (HCC)    Cellulitis 01/11/2020   Blister of leg 01/06/2020   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/28/2017   Chest pain 10/28/2017   GERD (gastroesophageal reflux disease) 10/28/2017   Past Medical History:  Diagnosis Date   Anemia    Aortic stenosis    Arthritis    CHF (congestive heart failure) (HCC)    Coronary artery disease    Diabetes mellitus without complication (HCC)    type 2   GERD (gastroesophageal reflux disease)    Heart murmur    History of blood transfusion    Hypercholesteremia    Hypertension    Hypothyroidism    Mitral regurgitation    Myocardial infarction (HCC)    Peripheral vascular disease (HCC)    PONV (postoperative nausea and vomiting)    Thyroid disease     Family History  Problem Relation Age of Onset   Diabetes Other    CAD Other     Past Surgical History:  Procedure Laterality Date   ABDOMINAL AORTOGRAM W/LOWER EXTREMITY Bilateral 01/14/2020   Procedure: ABDOMINAL AORTOGRAM W/LOWER EXTREMITY;  Surgeon: Cephus Shelling, MD;  Location: [facility];  Service: Cardiovascular;  Laterality: Bilateral;   ABDOMINAL AORTOGRAM W/LOWER EXTREMITY N/A 07/05/2022   Procedure: ABDOMINAL AORTOGRAM W/LOWER EXTREMITY;  Surgeon: Cephus Shelling, MD;  Location: [facility];  Service: Cardiovascular;  Laterality: N/A;   ABDOMINAL AORTOGRAM W/LOWER EXTREMITY N/A 08/02/2022   Procedure: ABDOMINAL AORTOGRAM W/LOWER EXTREMITY;  Surgeon: Cephus Shelling, MD;  Location:  [facility];  Service: Cardiovascular;  Laterality: N/A;   ABDOMINAL AORTOGRAM W/LOWER EXTREMITY N/A 08/06/2022   Procedure: ABDOMINAL AORTOGRAM W/LOWER EXTREMITY;  Surgeon: Cephus Shelling, MD;  Location: [facility];  Service: Cardiovascular;  Laterality: N/A;   AMPUTATION Bilateral 07/20/2022   Procedure: AMPUTATION RIGHT GREAT TOE AND SECOND TOE, AMPUTATION LEFT GREAT TOE;  Surgeon: Nadara Mustard, MD;  Location: [facility] OR;  Service: Orthopedics;  Laterality: Bilateral;   AMPUTATION Bilateral 08/08/2022   Procedure: BILATERAL TRANSMETATARSAL AMPUTATION;  Surgeon: Nadara Mustard, MD;  Location: [facility];  Service: Orthopedics;  Laterality: Bilateral;   AMPUTATION TOE     PERIPHERAL VASCULAR ATHERECTOMY  Right 01/14/2020   Procedure: PERIPHERAL VASCULAR ATHERECTOMY;  Surgeon: Cephus Shelling, MD;  Location: [facility] INVASIVE CV LAB;  Service: Cardiovascular;  Laterality: Right;  sfa   PERIPHERAL VASCULAR INTERVENTION Right 01/14/2020   Procedure: PERIPHERAL VASCULAR INTERVENTION;  Surgeon: Cephus Shelling, MD;  Location: [facility];  Service: Cardiovascular;  Laterality: Right;  sfa stent    PERIPHERAL VASCULAR INTERVENTION  07/05/2022   Procedure: PERIPHERAL VASCULAR INTERVENTION;  Surgeon: Cephus Shelling, MD;  Location: [facility];  Service: Cardiovascular;;   PERIPHERAL VASCULAR INTERVENTION  08/02/2022   Procedure: PERIPHERAL VASCULAR INTERVENTION;  Surgeon: Cephus Shelling, MD;  Location: [facility];  Service: Cardiovascular;;   PERIPHERAL VASCULAR INTERVENTION  08/06/2022   Procedure: PERIPHERAL VASCULAR INTERVENTION;  Surgeon: Cephus Shelling, MD;  Location: [facility] INVASIVE CV LAB;  Service: Cardiovascular;;   PERIPHERAL VASCULAR THROMBECTOMY  08/02/2022   Procedure: PERIPHERAL VASCULAR THROMBECTOMY;  Surgeon: Cephus Shelling, MD;  Location: [facility];  Service: Cardiovascular;;   RIGHT/LEFT HEART CATH AND CORONARY ANGIOGRAPHY N/A  08/23/2022   Procedure: RIGHT/LEFT HEART CATH AND CORONARY ANGIOGRAPHY;  Surgeon: [name], MD;  Location: [facility] INVASIVE CV LAB;  Service: Cardiovascular;  Laterality: N/A;   SKIN SPLIT GRAFT Right 03/18/2020   Procedure: SKIN GRAFTING RIGHT FOOT ULCER;  Surgeon: Nadara Mustard, MD;  Location: [facility] OR;  Service: Orthopedics;  Laterality: Right;   STUMP REVISION Right 11/30/2022   Procedure: REVISION RIGHT TRANSMETATARSAL AMPUTATION;  Surgeon: Nadara Mustard, MD;  Location: [facility] OR;  Service: Orthopedics;  Laterality: Right;   TEE WITHOUT CARDIOVERSION N/A 08/27/2022   Procedure: TRANSESOPHAGEAL ECHOCARDIOGRAM;  Surgeon: [name], MD;  Location: [facility] INVASIVE CV LAB;  Service: Cardiovascular;  Laterality: N/A;   WISDOM TOOTH EXTRACTION     Social History   Occupational History   Not on file  Tobacco Use   Smoking status: Former    Current packs/day: 0.00    Types: Cigarettes    Quit date: 01/2017    Years since quitting: 6.1   Smokeless tobacco: Never  Vaping Use   Vaping status: Never Used  Substance and Sexual Activity   Alcohol use: Not Currently   Drug use: Yes    Types: Marijuana    Comment: on ocassion- Last time - 11/13/22   Sexual activity: Yes    Birth control/protection: Post-menopausal

## 2023-04-03 NOTE — ED Notes (Signed)
Pt not in room.

## 2023-04-03 NOTE — H&P (Addendum)
History and Physical   Kayla Schmitt ZOX:[id number] DOB: [birth year] DOA: 04/03/2023  PCP: [facility]   Patient coming from: Home  Chief Complaint: Shortness of breath  HPI: Kayla Schmitt is a 58 y.o. female with medical history significant of hypertension, hyperlipidemia, hypothyroidism, diabetes, GERD, anemia, CAD, PAD, obesity, aortic stenosis, mitral regurgitation, CHF, cellulitis and osteomyelitis presenting with worsening shortness of breath.  Patient reports 2 weeks of shortness of breath especially on exertion.  She had tried to follow-up outpatient with her cardiologist office and was able to get an appointment for today but her symptoms had gotten so bad she came to the ED directly instead.  Denies any chest pain or diaphoresis.  Further denies any bleeding or dark or bloody stools.  Does report that she had some nausea vomiting and diarrhea couple weeks ago and never recovered after that.  Denies fevers, chills, chest pain, abdominal pain, constipation.  ED Course: Vital signs in the ED notable for blood pressure in the 80s to 90 systolic.  Lab workup included CMP with sodium 134, BUN 26, creatinine elevated to 1.9 from baseline of 1.1-1.3, glucose 142, calcium 8.4, albumin 2.4.  CBC with hemoglobin 6.5 down from baseline of 7-8, platelets 4 7.  BNP elevated to 1033, troponin mildly elevated to 34 with repeat pending.  Patient has had 1 unit PRBC ordered for transfusion in the ED.  Cardiology consulted and will see the patient.  Review of Systems: As per HPI otherwise all other systems reviewed and are negative.  Past Medical History:  Diagnosis Date   Anemia    Aortic stenosis    Arthritis    CHF (congestive heart failure) (HCC)    Coronary artery disease    Diabetes mellitus without complication (HCC)    type 2   GERD (gastroesophageal reflux disease)    Heart murmur    History of blood transfusion    Hypercholesteremia    Hypertension     Hypothyroidism    Mitral regurgitation    Myocardial infarction (HCC)    Peripheral vascular disease (HCC)    PONV (postoperative nausea and vomiting)    Thyroid disease     Past Surgical History:  Procedure Laterality Date   ABDOMINAL AORTOGRAM W/LOWER EXTREMITY Bilateral 01/14/2020   Procedure: ABDOMINAL AORTOGRAM W/LOWER EXTREMITY;  Surgeon: Cephus Shelling, MD;  Location: [facility];  Service: Cardiovascular;  Laterality: Bilateral;   ABDOMINAL AORTOGRAM W/LOWER EXTREMITY N/A 07/05/2022   Procedure: ABDOMINAL AORTOGRAM W/LOWER EXTREMITY;  Surgeon: Cephus Shelling, MD;  Location: [facility];  Service: Cardiovascular;  Laterality: N/A;   ABDOMINAL AORTOGRAM W/LOWER EXTREMITY N/A 08/02/2022   Procedure: ABDOMINAL AORTOGRAM W/LOWER EXTREMITY;  Surgeon: Cephus Shelling, MD;  Location: [facility];  Service: Cardiovascular;  Laterality: N/A;   ABDOMINAL AORTOGRAM W/LOWER EXTREMITY N/A 08/06/2022   Procedure: ABDOMINAL AORTOGRAM W/LOWER EXTREMITY;  Surgeon: Cephus Shelling, MD;  Location: [facility];  Service: Cardiovascular;  Laterality: N/A;   AMPUTATION Bilateral 07/20/2022   Procedure: AMPUTATION RIGHT GREAT TOE AND SECOND TOE, AMPUTATION LEFT GREAT TOE;  Surgeon: Nadara Mustard, MD;  Location: [facility];  Service: Orthopedics;  Laterality: Bilateral;   AMPUTATION Bilateral 08/08/2022   Procedure: BILATERAL TRANSMETATARSAL AMPUTATION;  Surgeon: Nadara Mustard, MD;  Location: [facility] OR;  Service: Orthopedics;  Laterality: Bilateral;   AMPUTATION TOE     PERIPHERAL VASCULAR ATHERECTOMY Right 01/14/2020   Procedure: PERIPHERAL VASCULAR ATHERECTOMY;  Surgeon: Cephus Shelling,  MD;  Location: [facility];  Service: Cardiovascular;  Laterality: Right;  sfa   PERIPHERAL VASCULAR INTERVENTION Right 01/14/2020   Procedure: PERIPHERAL VASCULAR INTERVENTION;  Surgeon: Cephus Shelling, MD;  Location: [facility];  Service: Cardiovascular;   Laterality: Right;  sfa stent    PERIPHERAL VASCULAR INTERVENTION  07/05/2022   Procedure: PERIPHERAL VASCULAR INTERVENTION;  Surgeon: Cephus Shelling, MD;  Location: [facility];  Service: Cardiovascular;;   PERIPHERAL VASCULAR INTERVENTION  08/02/2022   Procedure: PERIPHERAL VASCULAR INTERVENTION;  Surgeon: Cephus Shelling, MD;  Location: [facility];  Service: Cardiovascular;;   PERIPHERAL VASCULAR INTERVENTION  08/06/2022   Procedure: PERIPHERAL VASCULAR INTERVENTION;  Surgeon: Cephus Shelling, MD;  Location: [facility] IN[facility];  Service: Cardiovascular;;   PERIPHERAL VASCULAR THROMBECTOMY  08/02/2022   Procedure: PERIPHERAL VASCULAR THROMBECTOMY;  Surgeon: Cephus Shelling, MD;  Location: [facility];  Service: Cardiovascular;;   RIGHT/LEFT HEART CATH AND CORONARY ANGIOGRAPHY N/A 08/23/2022   Procedure: RIGHT/LEFT HEART CATH AND CORONARY ANGIOGRAPHY;  Surgeon: [name], MD;  Location: [facility] INVASIVE CV [facility];  Service: Cardiovascular;  Laterality: N/A;   SKIN SPLIT GRAFT Right 03/18/2020   Procedure: SKIN GRAFTING RIGHT FOOT ULCER;  Surgeon: Nadara Mustard, MD;  Location: [facility] OR;  Service: Orthopedics;  Laterality: Right;   STUMP REVISION Right 11/30/2022   Procedure: REVISION RIGHT TRANSMETATARSAL AMPUTATION;  Surgeon: Nadara Mustard, MD;  Location: [facility] OR;  Service: Orthopedics;  Laterality: Right;   TEE WITHOUT CARDIOVERSION N/A 08/27/2022   Procedure: TRANSESOPHAGEAL ECHOCARDIOGRAM;  Surgeon: [name], MD;  Location: [facility] INV[facility]IVE CV LAB;  Service: Cardiovascular;  Laterality: N/A;   WISDOM TOOTH EXTRACTION      Social History  reports that she quit smoking about 6 years ago. Her smoking use included cigarettes. She has never used smokeless tobacco. She reports that she does not currently use alcohol. She reports current drug use. Drug: Marijuana.  Allergies  Allergen Reactions   Bactrim Ds [Sulfamethoxazole-Trimethoprim] Nausea Only and  Other (See Comments)    Severe stomach discomfort   Ozempic (0.25 Or 0.5 Mg-Dose) [Semaglutide(0.25 Or 0.5mg -Dos)] Other (See Comments)    Severe stomach discomfort   Trental [Pentoxifylline] Nausea And Vomiting   Vibramycin [Doxycycline] Nausea And Vomiting   Penicillins Rash    Tolerated cephalosporins 07/2022     Family History  Problem Relation Age of Onset   Diabetes Other    CAD Other    Reviewed on admission Prior to Admission medications   Medication Sig Start Date End Date Taking? Authorizing Provider  acetaminophen (TYLENOL) 500 MG tablet Take 1,000 mg by mouth 2 (two) times daily as needed for moderate pain (pain score 4-6), headache or fever.   Yes [provider]  amLODipine (NORVASC) 10 MG tablet Take 10 mg by mouth daily.   Yes [provider]  aspirin 81 MG EC tablet Take 81 mg by mouth daily.   Yes [provider]  atorvastatin (LIPITOR) 40 MG tablet Take 40 mg by mouth daily. 12/14/19  Yes [provider]  clopidogrel (PLAVIX) 75 MG tablet TAKE 1 TABLET BY MOUTH EVERY DAY WITH BREAKFAST 01/28/23  Yes Maeola Harman, MD  diphenhydrAMINE HCl, Sleep, (ZZZQUIL) 25 MG CAPS Take 25 mg by mouth at bedtime as needed (Sleep).   Yes [provider]  furosemide (LASIX) 40 MG tablet Take 1 tablet (40 mg total) by mouth 2 (two) times daily. 08/27/22  Yes  Willeen Niece, MD  gabapentin (NEURONTIN) 300 MG capsule Take 300 mg by mouth at bedtime.   Yes [provider]  insulin degludec (TRESIBA FLEXTOUCH) 100 UNIT/ML FlexTouch Pen Inject 10 Units into the skin at bedtime. Patient taking differently: Inject 16 Units into the skin at bedtime. 01/15/20  Yes Danford, Earl Lites, MD  levothyroxine (SYNTHROID) 150 MCG tablet Take 150 mcg by mouth daily before breakfast. 12/02/19  Yes [provider]  lisinopril (ZESTRIL) 2.5 MG tablet Take 1 tablet (2.5 mg total) by mouth daily. 11/06/22  Yes Mallipeddi, Vishnu P, MD   metoprolol succinate (TOPROL-XL) 25 MG 24 hr tablet Take 0.5 tablets (12.5 mg total) by mouth daily. 08/28/22  Yes Willeen Niece, MD  nitroGLYCERIN (NITROSTAT) 0.4 MG SL tablet Place 1 tablet (0.4 mg total) under the tongue every 5 (five) minutes x 3 doses as needed for chest pain (if no relief after 3rd dose proceed to ED or call 911). 11/06/2022-New 11/06/22  Yes Mallipeddi, Vishnu P, MD  oxyCODONE-acetaminophen (PERCOCET/ROXICET) 5-325 MG tablet Take 1 tablet by mouth every 6 (six) hours as needed for severe pain (pain score 7-10). 03/22/23  Yes Terrilee Files, MD  pantoprazole (PROTONIX) 40 MG tablet Take 40 mg by mouth daily. 12/21/19  Yes [provider]  promethazine (PHENERGAN) 25 MG tablet Take 1 tablet (25 mg total) by mouth every 6 (six) hours as needed for nausea or vomiting. 03/22/23  Yes Terrilee Files, MD  sulfamethoxazole-trimethoprim (BACTRIM DS) 800-160 MG tablet Take 1 tablet by mouth 2 (two) times daily. Patient not taking: Reported on 04/03/2023 03/18/23   Nadara Mustard, MD    Physical Exam: Vitals:   04/03/23 1105 04/03/23 1209 04/03/23 1215 04/03/23 1230  BP: 94/83 (!) 89/55 (!) 89/55 (!) 91/58  Pulse: 81 78 78 77  Resp: 20 16 16 17   Temp:  98.7 F (37.1 C) 98.7 F (37.1 C) 99.1 F (37.3 C)  TempSrc:  Oral Oral Oral  SpO2: 100% 100%  100%    Physical Exam Constitutional:      General: She is not in acute distress.    Appearance: Normal appearance. She is obese.  HENT:     Head: Normocephalic and atraumatic.     Mouth/Throat:     Mouth: Mucous membranes are moist.     Pharynx: Oropharynx is clear.  Eyes:     Extraocular Movements: Extraocular movements intact.     Pupils: Pupils are equal, round, and reactive to light.  Cardiovascular:     Rate and Rhythm: Normal rate and regular rhythm.     Pulses: Normal pulses.     Heart sounds: Normal heart sounds.  Pulmonary:     Effort: Pulmonary effort is normal. No respiratory distress.     Breath  sounds: Rales present.  Abdominal:     General: Bowel sounds are normal. There is no distension.     Palpations: Abdomen is soft.     Tenderness: There is no abdominal tenderness.  Musculoskeletal:        General: No swelling or deformity.     Left lower leg: Edema (Minimal) present.     Comments: Status post bilateral transmetatarsal amputation  Skin:    General: Skin is warm and dry.  Neurological:     General: No focal deficit present.     Mental Status: Mental status is at baseline.    Labs on Admission: I have personally reviewed following labs and imaging studies  CBC: Recent Labs  Lab 04/03/23 0905  WBC 10.0  NEUTROABS 7.9*  HGB 6.5*  HCT 22.3*  MCV 85.4  PLT 407*    Basic Metabolic Panel: Recent Labs  Lab 04/03/23 0905  NA 134*  K 3.7  CL 101  CO2 23  GLUCOSE 142*  BUN 26*  CREATININE 1.91*  CALCIUM 8.4*    GFR: Estimated Creatinine Clearance: 35.1 mL/min (A) (by C-G formula based on SCr of 1.91 mg/dL (H)).  Liver Function Tests: Recent Labs  Lab 04/03/23 0905  AST 9*  ALT 9  ALKPHOS 105  BILITOT 0.5  PROT 7.0  ALBUMIN 2.4*    Urine analysis:    Component Value Date/Time   COLORURINE YELLOW 01/11/2020 1150   APPEARANCEUR HAZY (A) 01/11/2020 1150   LABSPEC 1.021 01/11/2020 1150   PHURINE 5.0 01/11/2020 1150   GLUCOSEU >=500 (A) 01/11/2020 1150   HGBUR NEGATIVE 01/11/2020 1150   BILIRUBINUR NEGATIVE 01/11/2020 1150   KETONESUR NEGATIVE 01/11/2020 1150   PROTEINUR >=300 (A) 01/11/2020 1150   NITRITE NEGATIVE 01/11/2020 1150   LEUKOCYTESUR NEGATIVE 01/11/2020 1150    Radiological Exams on Admission: DG Chest Port 1 View  Result Date: 04/03/2023 CLINICAL DATA:  Short of breath. EXAM: PORTABLE CHEST 1 VIEW COMPARISON:  08/18/2022. FINDINGS: Cardiac silhouette normal in size.  No mediastinal or hilar masses. There is vascular congestion and bilateral interstitial thickening most evident in the lower lungs. Hazy opacity at the lung bases  suggests small effusions. No pneumothorax. Skeletal structures are grossly intact. IMPRESSION: 1. Interstitial thickening and small effusions. Suspect interstitial pulmonary edema. No convincing pneumonia. . Electronically Signed   By: Amie Portland M.D.   On: 04/03/2023 10:00    EKG: Independently reviewed.  Sinus rhythm at 82 bpm.  Nonspecific T wave changes multiple leads.  Apparent LVH with some repolarization abnormality.  Minimal baseline wander.  Nonspecific interventricular conduction delay with QRS 114 and QTc prolonged at 502.  Assessment/Plan Principal Problem:   Acute on chronic combined systolic (congestive) and diastolic (congestive) heart failure (HCC) Active Problems:   Peripheral arterial disease (HCC)   DM type 2 (diabetes mellitus, type 2) (HCC)   GERD (gastroesophageal reflux disease)   Hyperlipidemia   Hypertension   Hypothyroidism   Obesity   Anemia   CAD (coronary artery disease)   Acute on chronic combined systolic and diastolic CHF > Last echo was in June of this year with EF 50-55%, G2 DD, normal RV function.  Also noted to have moderate aortic stenosis and moderate mitral regurgitation. > Has had 2 weeks of worsening shortness of breath especially on exertion.  Chest x-ray consistent with pulmonary edema.  Noted to have AKI as below.  Acute on chronic anemia as below as well possible delusional component. > BNP 1033, troponin 34 with repeat pending.  No Lasix ordered in the ED given blood pressure has been low normal in the 80s to 90s systolic.  No significant edema but does have rales on exam. > There was some concern that this could be an anginal equivalent given her multivessel disease with CT surgery workup ongoing, but troponin only mildly elevated to 34. > Cardiology has been consulted and will see the patient. - Monitor on progressive unit overnight - Appreciate cardiology recommendations and assistance - Will hold off on diuresis pending cardiology  recommendations - Check magnesium - Trend renal function and electrolytes - Strict I's and O's, daily weights - Follow-up repeat troponin - Hold off on repeat echocardiogram  Acute on chronic anemia >  Known history of anemia with baseline hemoglobin in the 7-8 range.  > Noted to have hemoglobin 6.5 which could be contributing to patient's symptoms, no bleeding nor dark or bloody stools.  Did have some nonbloody nausea vomiting and diarrhea couple weeks ago. > Labs were consistent with CHF masturbation as above which could indicate a delusional component to anemia, however 1 unit ordered for transfusion in the ED given goal hemoglobin would be closer to 8 and CHF. > 1 unit PRBC ordered for transfusion - Monitoring in progressive unit as above - Continue with 1 unit PRBC transfusion - Trend CBC - Will continue with antiplatelets for severe CAD/PAD - Iron studies  AKI > Creatinine elevated to 1.9 from baseline of 1.1-1.3. > Does report a couple weeks ago having nausea vomiting diarrhea though patient's labs and imaging more consistent with volume overload as above. > Complicated by low normal blood pressure, awaiting cardiology recommendations on diuresis. - Hold off on diuresis for now pending cardiology recommendations - Trend renal function and electrolytes  Hypertension > Reports blood pressure has been in the 120s to 140s until the last 2 to 4 weeks when her blood pressure started to read lower in the 90s to 120s systolic. > Does report losing a significant amount of weight over the last year to year and a half.  Unclear if she needs dose adjustment. - Holding all antihypertensives given low normal blood pressure in the ED and AKI - May need to adjust medications on discharge pending her response, possibly discontinue amlodipine. - Anticipate she will receive some diuresis  Hyperlipidemia - Continue home atorvastatin  Hypothyroidism - Continue Synthroid  Diabetes > 16 units  nightly at home. - 10 units nightly, SSI  GERD - Continue home PPI  CAD > Catheter earlier this year showed significant multivessel disease and patient was furred to CT surgery.  CT surgery held off on further evaluation/consideration until patient lower extremity wounds were able to be addressed/healed. - Appreciate cardiology recommendations and assistance - Follow-up repeat troponin - Continue home aspirin, Plavix, statin - Holding lisinopril, metoprolol as above  PAD > History of significant PAD and prior limb ischemia.  Status post intervention in bilateral lower extremities including stenting bilaterally. - Continue aspirin, Plavix, statin as above  Obesity - Noted  History of cellulitis and osteomyelitis Status post bilateral transmetatarsal amputations > Improved wound healing, compared to earlier this year.  Had recent follow-up with Dr. Lajoyce Corners.  DVT prophylaxis: SCDs for now Code Status:   Full Family Communication:  Updated at bedside Disposition Plan:   Patient is from:  Home  Anticipated DC to:  Home  Anticipated DC date:  1 to 5 days  Anticipated DC barriers: None  Consults called:  Cardiology Admission status:  Observation, progressive  Severity of Illness: The appropriate patient status for this patient is OBSERVATION. Observation status is judged to be reasonable and necessary in order to provide the required intensity of service to ensure the patient's safety. The patient's presenting symptoms, physical exam findings, and initial radiographic and laboratory data in the context of their medical condition is felt to place them at decreased risk for further clinical deterioration. Furthermore, it is anticipated that the patient will be medically stable for discharge from the hospital within 2 midnights of admission.    Synetta Fail MD [facility]  How to contact the [facility] Attending or Consulting provider 7A - 7P or covering provider during after hours  7P -7A, for this patient?  Check the care team in [facility] and look for a) attending/consulting [facility] provider listed and b) the [facility] team listed Log into [email or web address] and use [place]'s universal password to access. If you do not have the password, please contact the hospital operator. Locate the [facility] provider you are looking for under [facility] and page to a number that you can be directly reached. If you still have difficulty reaching the provider, please page the [facility] (Director on Call) for the Hospitalists listed on amion for assistance.  04/03/2023, 1:27 PM

## 2023-04-03 NOTE — ED Provider Triage Note (Signed)
Emergency Medicine Provider Triage Evaluation Note  Kayla Schmitt , a 57 y.o. female  was evaluated in triage.  Pt complains of shortness of breath on exertion.  This been going on for at least a couple weeks.  She had made an appointment to see a cardiologist and it was today but she did not feel like she can wait that long.  She denies any chest pain or pressure with this.  Had been seen in the ED setting previously and found to have multivessel cardiac disease requiring CABG however due to her significant peripheral vascular disease it was deferred.  Review of Systems  Positive: Sob on exertion Negative: Cough, fever, abdominal pain,, hx of PE or DVT  Physical Exam  BP (!) 87/74   Pulse 81   Temp 98.3 F (36.8 C)   Resp 18   SpO2 98%  Gen:   Awake, no distress   Resp:  Normal effort  MSK:   Moves extremities without difficulty  Other:  No obvious murmur  Medical Decision Making  Medically screening exam initiated at 9:03 AM.  Appropriate orders placed.  Kayla Schmitt was informed that the remainder of the evaluation will be completed by another provider, this initial triage assessment does not replace that evaluation, and the importance of remaining in the ED until their evaluation is complete.  Cardiac workup ordered.  Concern for anginal equivalent.  Likely will need discussion with cardiology.   Melene Plan, DO 04/03/23 [phone]

## 2023-04-03 NOTE — ED Triage Notes (Addendum)
Pt. Stated, Kayla Schmitt had some weakness, dizziness, and SOB has been going on for a week and half. I feel like I have no energy and if I go very far Im SOB  Before Thanksgiving I had N/V for 4 days

## 2023-04-04 ENCOUNTER — Encounter (HOSPITAL_COMMUNITY): Payer: Self-pay | Admitting: Internal Medicine

## 2023-04-04 ENCOUNTER — Inpatient Hospital Stay (HOSPITAL_COMMUNITY): Payer: Managed Care, Other (non HMO)

## 2023-04-04 DIAGNOSIS — E039 Hypothyroidism, unspecified: Secondary | ICD-10-CM | POA: Diagnosis present

## 2023-04-04 DIAGNOSIS — N179 Acute kidney failure, unspecified: Secondary | ICD-10-CM | POA: Diagnosis present

## 2023-04-04 DIAGNOSIS — K644 Residual hemorrhoidal skin tags: Secondary | ICD-10-CM | POA: Diagnosis present

## 2023-04-04 DIAGNOSIS — Z7902 Long term (current) use of antithrombotics/antiplatelets: Secondary | ICD-10-CM

## 2023-04-04 DIAGNOSIS — I340 Nonrheumatic mitral (valve) insufficiency: Secondary | ICD-10-CM

## 2023-04-04 DIAGNOSIS — I342 Nonrheumatic mitral (valve) stenosis: Secondary | ICD-10-CM

## 2023-04-04 DIAGNOSIS — E1169 Type 2 diabetes mellitus with other specified complication: Secondary | ICD-10-CM | POA: Diagnosis present

## 2023-04-04 DIAGNOSIS — I2510 Atherosclerotic heart disease of native coronary artery without angina pectoris: Secondary | ICD-10-CM | POA: Diagnosis present

## 2023-04-04 DIAGNOSIS — I351 Nonrheumatic aortic (valve) insufficiency: Secondary | ICD-10-CM | POA: Diagnosis not present

## 2023-04-04 DIAGNOSIS — K2950 Unspecified chronic gastritis without bleeding: Secondary | ICD-10-CM | POA: Diagnosis not present

## 2023-04-04 DIAGNOSIS — I350 Nonrheumatic aortic (valve) stenosis: Secondary | ICD-10-CM | POA: Diagnosis not present

## 2023-04-04 DIAGNOSIS — E860 Dehydration: Secondary | ICD-10-CM | POA: Diagnosis present

## 2023-04-04 DIAGNOSIS — Z87891 Personal history of nicotine dependence: Secondary | ICD-10-CM | POA: Diagnosis not present

## 2023-04-04 DIAGNOSIS — K5520 Angiodysplasia of colon without hemorrhage: Secondary | ICD-10-CM | POA: Diagnosis not present

## 2023-04-04 DIAGNOSIS — Z89432 Acquired absence of left foot: Secondary | ICD-10-CM | POA: Diagnosis not present

## 2023-04-04 DIAGNOSIS — E1151 Type 2 diabetes mellitus with diabetic peripheral angiopathy without gangrene: Secondary | ICD-10-CM | POA: Diagnosis present

## 2023-04-04 DIAGNOSIS — D649 Anemia, unspecified: Secondary | ICD-10-CM | POA: Diagnosis not present

## 2023-04-04 DIAGNOSIS — E669 Obesity, unspecified: Secondary | ICD-10-CM | POA: Diagnosis present

## 2023-04-04 DIAGNOSIS — K648 Other hemorrhoids: Secondary | ICD-10-CM | POA: Diagnosis present

## 2023-04-04 DIAGNOSIS — E1122 Type 2 diabetes mellitus with diabetic chronic kidney disease: Secondary | ICD-10-CM | POA: Diagnosis present

## 2023-04-04 DIAGNOSIS — K5730 Diverticulosis of large intestine without perforation or abscess without bleeding: Secondary | ICD-10-CM | POA: Diagnosis present

## 2023-04-04 DIAGNOSIS — Z79890 Hormone replacement therapy (postmenopausal): Secondary | ICD-10-CM | POA: Diagnosis not present

## 2023-04-04 DIAGNOSIS — D125 Benign neoplasm of sigmoid colon: Secondary | ICD-10-CM | POA: Diagnosis present

## 2023-04-04 DIAGNOSIS — I5043 Acute on chronic combined systolic (congestive) and diastolic (congestive) heart failure: Secondary | ICD-10-CM | POA: Diagnosis present

## 2023-04-04 DIAGNOSIS — I130 Hypertensive heart and chronic kidney disease with heart failure and stage 1 through stage 4 chronic kidney disease, or unspecified chronic kidney disease: Secondary | ICD-10-CM | POA: Diagnosis present

## 2023-04-04 DIAGNOSIS — E7800 Pure hypercholesterolemia, unspecified: Secondary | ICD-10-CM | POA: Diagnosis present

## 2023-04-04 DIAGNOSIS — Z79899 Other long term (current) drug therapy: Secondary | ICD-10-CM | POA: Diagnosis not present

## 2023-04-04 DIAGNOSIS — Z89431 Acquired absence of right foot: Secondary | ICD-10-CM | POA: Diagnosis not present

## 2023-04-04 DIAGNOSIS — N1831 Chronic kidney disease, stage 3a: Secondary | ICD-10-CM | POA: Diagnosis present

## 2023-04-04 DIAGNOSIS — D509 Iron deficiency anemia, unspecified: Secondary | ICD-10-CM

## 2023-04-04 DIAGNOSIS — I083 Combined rheumatic disorders of mitral, aortic and tricuspid valves: Secondary | ICD-10-CM | POA: Diagnosis present

## 2023-04-04 DIAGNOSIS — I890 Lymphedema, not elsewhere classified: Secondary | ICD-10-CM | POA: Diagnosis not present

## 2023-04-04 LAB — HEMOGLOBIN AND HEMATOCRIT, BLOOD
HCT: 29.3 % — ABNORMAL LOW (ref 36.0–46.0)
Hemoglobin: 9.2 g/dL — ABNORMAL LOW (ref 12.0–15.0)

## 2023-04-04 LAB — ECHOCARDIOGRAM COMPLETE
AR max vel: 0.98 cm2
AV Area VTI: 1.13 cm2
AV Area mean vel: 0.92 cm2
AV Mean grad: 13.2 mm[Hg]
AV Peak grad: 25.2 mm[Hg]
Ao pk vel: 2.51 m/s
Area-P 1/2: 5.46 cm2
Calc EF: 60.1 %
Height: 64 in
MV M vel: 4.41 m/s
MV Peak grad: 77.8 mm[Hg]
MV VTI: 1.7 cm2
P 1/2 time: 837 ms
Radius: 0.3 cm
S' Lateral: 3.9 cm
Single Plane A2C EF: 57.7 %
Single Plane A4C EF: 60.5 %
Weight: 3276.92 [oz_av]

## 2023-04-04 LAB — CBC
HCT: 21.4 % — ABNORMAL LOW (ref 36.0–46.0)
Hemoglobin: 6.3 g/dL — CL (ref 12.0–15.0)
MCH: 25.1 pg — ABNORMAL LOW (ref 26.0–34.0)
MCHC: 29.4 g/dL — ABNORMAL LOW (ref 30.0–36.0)
MCV: 85.3 fL (ref 80.0–100.0)
Platelets: 297 10*3/uL (ref 150–400)
RBC: 2.51 MIL/uL — ABNORMAL LOW (ref 3.87–5.11)
RDW: 16.5 % — ABNORMAL HIGH (ref 11.5–15.5)
WBC: 7 10*3/uL (ref 4.0–10.5)
nRBC: 0 % (ref 0.0–0.2)

## 2023-04-04 LAB — COMPREHENSIVE METABOLIC PANEL
ALT: 7 U/L (ref 0–44)
AST: 8 U/L — ABNORMAL LOW (ref 15–41)
Albumin: 2.1 g/dL — ABNORMAL LOW (ref 3.5–5.0)
Alkaline Phosphatase: 88 U/L (ref 38–126)
Anion gap: 9 (ref 5–15)
BUN: 33 mg/dL — ABNORMAL HIGH (ref 6–20)
CO2: 22 mmol/L (ref 22–32)
Calcium: 8.3 mg/dL — ABNORMAL LOW (ref 8.9–10.3)
Chloride: 104 mmol/L (ref 98–111)
Creatinine, Ser: 1.82 mg/dL — ABNORMAL HIGH (ref 0.44–1.00)
GFR, Estimated: 32 mL/min — ABNORMAL LOW (ref >60)
Glucose, Bld: 111 mg/dL — ABNORMAL HIGH (ref 70–99)
Potassium: 3.7 mmol/L (ref 3.5–5.1)
Sodium: 135 mmol/L (ref 135–145)
Total Bilirubin: 0.7 mg/dL (ref <1.2)
Total Protein: 5.9 g/dL — ABNORMAL LOW (ref 6.5–8.1)

## 2023-04-04 LAB — GLUCOSE, CAPILLARY
Glucose-Capillary: 126 mg/dL — ABNORMAL HIGH (ref 70–99)
Glucose-Capillary: 110 mg/dL — ABNORMAL HIGH (ref 70–99)
Glucose-Capillary: 127 mg/dL — ABNORMAL HIGH (ref 70–99)

## 2023-04-04 LAB — HAPTOGLOBIN: Haptoglobin: 421 mg/dL — ABNORMAL HIGH (ref 33–346)

## 2023-04-04 LAB — CBG MONITORING, ED: Glucose-Capillary: 97 mg/dL (ref 70–99)

## 2023-04-04 LAB — PREPARE RBC (CROSSMATCH)

## 2023-04-04 LAB — MRSA NEXT GEN BY PCR, NASAL: MRSA by PCR Next Gen: DETECTED — AB

## 2023-04-04 MED ORDER — PERFLUTREN LIPID MICROSPHERE
1.0000 mL | INTRAVENOUS | Status: AC | PRN
  Administered 2023-04-04: 3 mL via INTRAVENOUS

## 2023-04-04 MED ORDER — FUROSEMIDE 10 MG/ML IJ SOLN
40.0000 mg | Freq: Two times a day (BID) | INTRAMUSCULAR | Status: DC
  Administered 2023-04-04 – 2023-04-05 (×3): 40 mg via INTRAVENOUS
  Filled 2023-04-04 (×3): qty 4

## 2023-04-04 MED ORDER — SODIUM CHLORIDE 0.9% IV SOLUTION: Freq: Once | INTRAVENOUS | Status: AC

## 2023-04-04 MED ORDER — POTASSIUM CHLORIDE CRYS ER 20 MEQ PO TBCR
40.0000 meq | EXTENDED_RELEASE_TABLET | Freq: Once | ORAL | Status: AC
  Administered 2023-04-04: 40 meq via ORAL
  Filled 2023-04-04: qty 2

## 2023-04-04 MED ORDER — METOPROLOL SUCCINATE ER 25 MG PO TB24
12.5000 mg | ORAL_TABLET | Freq: Every day | ORAL | Status: DC
  Administered 2023-04-04 – 2023-04-07 (×3): 12.5 mg via ORAL
  Filled 2023-04-04 (×4): qty 1

## 2023-04-04 NOTE — Plan of Care (Signed)
  Problem: Coping: Goal: Ability to adjust to condition or change in health will improve Outcome: Progressing   Problem: Fluid Volume: Goal: Ability to maintain a balanced intake and output will improve Outcome: Progressing   Problem: Health Behavior/Discharge Planning: Goal: Ability to identify and utilize available resources and services will improve Outcome: Progressing Goal: Ability to manage health-related needs will improve Outcome: Progressing   Problem: Nutritional: Goal: Progress toward achieving an optimal weight will improve Outcome: Progressing   Problem: Skin Integrity: Goal: Risk for impaired skin integrity will decrease Outcome: Progressing   Problem: Tissue Perfusion: Goal: Adequacy of tissue perfusion will improve Outcome: Progressing   Problem: Education: Goal: Knowledge of General Education information will improve Description: Including pain rating scale, medication(s)/side effects and non-pharmacologic comfort measures Outcome: Progressing   Problem: Clinical Measurements: Goal: Respiratory complications will improve Outcome: Progressing Goal: Cardiovascular complication will be avoided Outcome: Progressing   Problem: Coping: Goal: Level of anxiety will decrease Outcome: Progressing   Problem: Pain Management: Goal: General experience of comfort will improve Outcome: Progressing   Problem: Safety: Goal: Ability to remain free from injury will improve Outcome: Progressing   Problem: Skin Integrity: Goal: Risk for impaired skin integrity will decrease Outcome: Progressing

## 2023-04-04 NOTE — Progress Notes (Addendum)
PROGRESS NOTE    Kayla Schmitt  HQI:[id number] DOB: [birth year] DOA: 04/03/2023 PCP: [facility]   57/F with severe multivessel CAD, aortic stenosis, chronic anemia, hypertension, diabetes, dyslipidemia, PAD with transmetatarsal amputations and foot wound presented to the ED with 2 weeks of ongoing dyspnea on exertion.  Prior to this had nausea and vomiting for the few days which was self-limiting. -In the ED blood pressure soft, sodium 134, creatinine 1.9, albumin 2.4, hemoglobin 6.5, baseline hemoglobin ranging from 6.5-7.8 range for this past year. -Had, started on diuretics, transfused 1 unit PRBC  Subjective: Feels better today, denies melena hematochezia hematemesis etc. -Denies chest pain, breathing is improving  Assessment and Plan:  Acute on chronic combined systolic and diastolic CHF -Last echo 6/24 noted EF 50-55% grade 2 DD, normal RV, moderate aortic stenosis moderate MR -Now with volume overload, compounded by worsening anemia -Cards following, continue IV Lasix today -GDMT limited by AKI  Moderate aortic stenosis -Follow-up repeat echo  CAD > Cath this year showed significant multivessel disease and patient was referred to CT surgery.  CT surgery held off on further evaluation/consideration until patient lower extremity wounds were able to be addressed/healed. - Appreciate cardiology recommendations and assistance - Continue home aspirin, Plavix, statin - Holding lisinopril   Acute on chronic iron deficiency anemia -Hemoglobin has ranged from 6.5-7.8 this year -No history of overt blood loss -Anemia panel with iron deficiency Transfused 1 unit PRBC yesterday, no significant response, transfuse 2 units PRBC today, will request gastroenterology eval, needs workup for anemia prior to any planned cardiac procedure   AKI - Creatinine elevated to 1.9 from baseline of 1.1-1.3. -Likely cardiorenal, monitor with diuresis, transfusion today -Avoid  hypotension   Hyperlipidemia - Continue home atorvastatin   Hypothyroidism - Continue Synthroid   Diabetes -Continue glargine, CBGs are stable   GERD - Continue home PPI   PAD > History of significant PAD and prior limb ischemia.  Status post intervention in bilateral lower extremities including stenting bilaterally 4/24. - Continue aspirin, Plavix, statin as above   Obesity - Noted   History of cellulitis and osteomyelitis Status post bilateral transmetatarsal amputations > Improved wound healing, compared to earlier this year.  Had recent follow-up with Dr. Lajoyce Corners.  DVT prophylaxis: SCDs Code Status: Full code Family Communication: Mother at bedside Disposition Plan:   Consultants:    Procedures:   Antimicrobials:    Objective: Vitals:   04/04/23 0757 04/04/23 0800 04/04/23 0930 04/04/23 1000  BP:  (!) 107/49 (!) 99/51 (!) 113/58  Pulse:  80 77 80  Resp:  18 20 20   Temp: 98.1 F (36.7 C)  98.4 F (36.9 C) 98.5 F (36.9 C)  TempSrc: Oral  Oral Oral  SpO2:  91% 100% 100%    Intake/Output Summary (Last 24 hours) at 04/04/2023 1018 Last data filed at 04/03/2023 1503 Gross per 24 hour  Intake 412.5 ml  Output --  Net 412.5 ml   There were no vitals filed for this visit.  Examination:  General exam: Chronically ill female sitting up in bed, AAOx3 HEENT: Positive JVD CVS: S1-S2, regular rhythm, systolic murmur Lungs: Few basilar Rales Abdomen: Soft, nontender, bowel sounds present Extremities: Trace edema, bilateral transmetatarsal amputation, open healing wound  Psychiatry:  Mood & affect appropriate.     Data Reviewed:   CBC: Recent Labs  Lab 04/03/23 0905 04/04/23 0442  WBC 10.0 7.0  NEUTROABS 7.9*  --   HGB 6.5* 6.3*  HCT 22.3* 21.4*  MCV  85.4 85.3  PLT 407* 297   Basic Metabolic Panel: Recent Labs  Lab 04/03/23 0905 04/04/23 0442  NA 134* 135  K 3.7 3.7  CL 101 104  CO2 23 22  GLUCOSE 142* 111*  BUN 26* 33*  CREATININE  1.91* 1.82*  CALCIUM 8.4* 8.3*   GFR: Estimated Creatinine Clearance: 36.8 mL/min (A) (by C-G formula based on SCr of 1.82 mg/dL (H)). Liver Function Tests: Recent Labs  Lab 04/03/23 0905 04/04/23 0442  AST 9* 8*  ALT 9 7  ALKPHOS 105 88  BILITOT 0.5 0.7  PROT 7.0 5.9*  ALBUMIN 2.4* 2.1*   No results for input(s): "LIPASE", "AMYLASE" in the last 168 hours. No results for input(s): "AMMONIA" in the last 168 hours. Coagulation Profile: No results for input(s): "INR", "PROTIME" in the last 168 hours. Cardiac Enzymes: No results for input(s): "CKTOTAL", "CKMB", "CKMBINDEX", "TROPONINI" in the last 168 hours. BNP (last 3 results) No results for input(s): "PROBNP" in the last 8760 hours. HbA1C: No results for input(s): "HGBA1C" in the last 72 hours. CBG: Recent Labs  Lab 04/03/23 1728 04/03/23 2306 04/04/23 0742  GLUCAP 121* 115* 97   Lipid Profile: No results for input(s): "CHOL", "HDL", "LDLCALC", "TRIG", "CHOLHDL", "LDLDIRECT" in the last 72 hours. Thyroid Function Tests: No results for input(s): "TSH", "T4TOTAL", "FREET4", "T3FREE", "THYROIDAB" in the last 72 hours. Anemia Panel: Recent Labs    04/03/23 1622  FERRITIN 67  TIBC 214*  IRON 26*   Urine analysis:    Component Value Date/Time   COLORURINE YELLOW 01/11/2020 1150   APPEARANCEUR HAZY (A) 01/11/2020 1150   LABSPEC 1.021 01/11/2020 1150   PHURINE 5.0 01/11/2020 1150   GLUCOSEU >=500 (A) 01/11/2020 1150   HGBUR NEGATIVE 01/11/2020 1150   BILIRUBINUR NEGATIVE 01/11/2020 1150   KETONESUR NEGATIVE 01/11/2020 1150   PROTEINUR >=300 (A) 01/11/2020 1150   NITRITE NEGATIVE 01/11/2020 1150   LEUKOCYTESUR NEGATIVE 01/11/2020 1150   Sepsis Labs: @LABRCNTIP (procalcitonin:4,lacticidven:4)  )No results found for this or any previous visit (from the past 240 hour(s)).   Radiology Studies: DG Chest Port 1 View  Result Date: 04/03/2023 CLINICAL DATA:  Short of breath. EXAM: PORTABLE CHEST 1 VIEW COMPARISON:   08/18/2022. FINDINGS: Cardiac silhouette normal in size.  No mediastinal or hilar masses. There is vascular congestion and bilateral interstitial thickening most evident in the lower lungs. Hazy opacity at the lung bases suggests small effusions. No pneumothorax. Skeletal structures are grossly intact. IMPRESSION: 1. Interstitial thickening and small effusions. Suspect interstitial pulmonary edema. No convincing pneumonia. . Electronically Signed   By: Amie Portland M.D.   On: 04/03/2023 10:00     Scheduled Meds:  sodium chloride   Intravenous Once   aspirin EC  81 mg Oral Daily   atorvastatin  40 mg Oral Daily   clopidogrel  75 mg Oral Daily   furosemide  40 mg Intravenous BID   gabapentin  300 mg Oral QHS   insulin aspart  0-15 Units Subcutaneous TID WC   insulin glargine-yfgn  10 Units Subcutaneous QHS   levothyroxine  150 mcg Oral Q0600   pantoprazole  40 mg Oral Daily   potassium chloride  40 mEq Oral Once   sodium chloride flush  3 mL Intravenous Q12H   Continuous Infusions:   LOS: 0 days    Time spent:    Zannie Cove, MD [facility]   04/04/2023, 10:18 AM

## 2023-04-04 NOTE — ED Notes (Signed)
ED TO INPATIENT HANDOFF REPORT  ED Nurse Name and Phone #: Topher [phone]  S Name/Age/Gender Kayla Schmitt 57 y.o. female Room/Bed: 003C/003C  Code Status   Code Status: Full Code  Home/SNF/Other Home Patient oriented to: self, place, time, and situation Is this baseline? Yes   Triage Complete: Triage complete  Chief Complaint Acute on chronic combined systolic (congestive) and diastolic (congestive) heart failure (HCC) [I50.43]  Triage Note Pt. Stated, Lavenia Atlas had some weakness, dizziness, and SOB has been going on for a week and half. I feel like I have no energy and if I go very far Im SOB  Before Thanksgiving I had N/V for 4 days    Allergies Allergies  Allergen Reactions   Bactrim Ds [Sulfamethoxazole-Trimethoprim] Nausea Only and Other (See Comments)    Severe stomach discomfort   Ozempic (0.25 Or 0.5 Mg-Dose) [Semaglutide(0.25 Or 0.5mg -Dos)] Other (See Comments)    Severe stomach discomfort   Trental [Pentoxifylline] Nausea And Vomiting   Vibramycin [Doxycycline] Nausea And Vomiting   Penicillins Rash    Tolerated cephalosporins 07/2022     Level of Care/Admitting Diagnosis ED Disposition     ED Disposition  Admit   Condition  --   Comment  Hospital Area: [facility] [[id number]]  Level of Care: Progressive [102]  Admit to Progressive based on following criteria: CARDIOVASCULAR & THORACIC of moderate stability with acute coronary syndrome symptoms/low risk myocardial infarction/hypertensive urgency/arrhythmias/heart failure potentially compromising stability and stable post cardiovascular intervention patients.  May place patient in observation at [facility] or [facility] if equivalent level of care is available:: No  Covid Evaluation: Asymptomatic - no recent exposure (last 10 days) testing not required  Diagnosis: Acute on chronic combined systolic (congestive) and diastolic (congestive) heart failure [facility]) [[id number]]  Admitting Physician:  Synetta Fail [[id number]]  Attending Physician: Synetta Fail [[id number]]          B Medical/Surgery History Past Medical History:  Diagnosis Date   Anemia    Aortic stenosis    Arthritis    CHF (congestive heart failure) (HCC)    Coronary artery disease    Diabetes mellitus without complication (HCC)    type 2   GERD (gastroesophageal reflux disease)    Heart murmur    History of blood transfusion    Hypercholesteremia    Hypertension    Hypothyroidism    Mitral regurgitation    Myocardial infarction (HCC)    Peripheral vascular disease (HCC)    PONV (postoperative nausea and vomiting)    Thyroid disease    Past Surgical History:  Procedure Laterality Date   ABDOMINAL AORTOGRAM W/LOWER EXTREMITY Bilateral 01/14/2020   Procedure: ABDOMINAL AORTOGRAM W/LOWER EXTREMITY;  Surgeon: Cephus Shelling, MD;  [facility] IN[facility];  Service: Cardiovascular;  Laterality: Bilateral;   ABDOMINAL AORTOGRAM W/LOWER EXTREMITY N/A 07/05/2022   Procedure: ABDOMINAL AORTOGRAM W/LOWER EXTREMITY;  Surgeon: Cephus Shelling, MD;  [facility]: [facility];  Service: Cardiovascular;  Laterality: N/A;   ABDOMINAL AORTOGRAM W/LOWER EXTREMITY N/A 08/02/2022   Procedure: ABDOMINAL AORTOGRAM W/LOWER EXTREMITY;  Surgeon: Cephus Shelling, MD;  [facility];  Service: Cardiovascular;  Laterality: N/A;   ABDOMINAL AORTOGRAM W/LOWER EXTREMITY N/A 08/06/2022   Procedure: ABDOMINAL AORTOGRAM W/LOWER EXTREMITY;  Surgeon: Cephus Shelling, MD;  [facility];  Service: Cardiovascular;  Laterality: N/A;   AMPUTATION Bilateral 07/20/2022   Procedure: AMPUTATION RIGHT GREAT TOE AND SECOND TOE, AMPUTATION LEFT GREAT TOE;  Surgeon: Nadara Mustard, MD;  Location: [facility] OR;  Service: Orthopedics;  Laterality: Bilateral;   AMPUTATION Bilateral 08/08/2022   Procedure: BILATERAL TRANSMETATARSAL AMPUTATION;  Surgeon: Nadara Mustard, MD;  Location: [facility] OR;  Service:  Orthopedics;  Laterality: Bilateral;   AMPUTATION TOE     PERIPHERAL VASCULAR ATHERECTOMY Right 01/14/2020   Procedure: PERIPHERAL VASCULAR ATHERECTOMY;  Surgeon: Cephus Shelling, MD;  Location: [facility] INVASIVE CV LAB;  Service: Cardiovascular;  Laterality: Right;  sfa   PERIPHERAL VASCULAR INTERVENTION Right 01/14/2020   Procedure: PERIPHERAL VASCULAR INTERVENTION;  Surgeon: Cephus Shelling, MD;  [facility];  Service: Cardiovascular;  Laterality: Right;  sfa stent    PERIPHERAL VASCULAR INTERVENTION  07/05/2022   Procedure: PERIPHERAL VASCULAR INTERVENTION;  Surgeon: Cephus Shelling, MD;  [facility];  Service: Cardiovascular;;   PERIPHERAL VASCULAR INTERVENTION  08/02/2022   Procedure: PERIPHERAL VASCULAR INTERVENTION;  Surgeon: Cephus Shelling, MD;  [facility];  Service: Cardiovascular;;   PERIPHERAL VASCULAR INTERVENTION  08/06/2022   Procedure: PERIPHERAL VASCULAR INTERVENTION;  Surgeon: Cephus Shelling, MD;  Location: [facility] INVASIVE CV LAB;  Service: Cardiovascular;;   PERIPHERAL VASCULAR THROMBECTOMY  08/02/2022   Procedure: PERIPHERAL VASCULAR THROMBECTOMY;  Surgeon: Cephus Shelling, MD;  [facility];  Service: Cardiovascular;;   RIGHT/LEFT HEART CATH AND CORONARY ANGIOGRAPHY N/A 08/23/2022   Procedure: RIGHT/LEFT HEART CATH AND CORONARY ANGIOGRAPHY;  Surgeon: [name], MD;  Location: [facility] INVASIVE CV LAB;  Service: Cardiovascular;  Laterality: N/A;   SKIN SPLIT GRAFT Right 03/18/2020   Procedure: SKIN GRAFTING RIGHT FOOT ULCER;  Surgeon: Nadara Mustard, MD;  Location: [facility] OR;  Service: Orthopedics;  Laterality: Right;   STUMP REVISION Right 11/30/2022   Procedure: REVISION RIGHT TRANSMETATARSAL AMPUTATION;  Surgeon: Nadara Mustard, MD;  Location: [facility] OR;  Service: Orthopedics;  Laterality: Right;   TEE WITHOUT CARDIOVERSION N/A 08/27/2022   Procedure: TRANSESOPHAGEAL ECHOCARDIOGRAM;  Surgeon:  [name], MD;  Location: [facility] INVASIVE CV LAB;  Service: Cardiovascular;  Laterality: N/A;   WISDOM TOOTH EXTRACTION       A IV Location/Drains/Wounds Patient Lines/Drains/Airways Status     Active Line/Drains/Airways     Name Placement date Placement time Site Days   Peripheral IV 04/03/23 20 G Right Antecubital 04/03/23  1057  Antecubital  1   Wound / Incision (Open or Dehisced) 08/20/22 Venous stasis ulcer Ankle Anterior;Left 08/20/22  0900  Ankle  227            Intake/Output Last 24 hours  Intake/Output Summary (Last 24 hours) at 04/04/2023 1040 Last data filed at 04/03/2023 1503 Gross per 24 hour  Intake 412.5 ml  Output --  Net 412.5 ml    Labs/Imaging Results for orders placed or performed during the hospital encounter of 04/03/23 (from the past 48 hour(s))  CBC with Differential     Status: Abnormal   Collection Time: 04/03/23  9:05 AM  Result Value Ref Range   WBC 10.0 4.0 - 10.5 K/uL   RBC 2.61 (L) 3.87 - 5.11 MIL/uL   Hemoglobin 6.5 (LL) 12.0 - 15.0 g/dL    Comment: REPEATED TO VERIFY THIS CRITICAL RESULT HAS VERIFIED AND BEEN CALLED TO CASSAUNDRA KHOURI,RN BY ZELDA BEECH ON 12 04 2024 AT 1034, AND HAS BEEN READ BACK.     HCT 22.3 (L) 36.0 - 46.0 %   MCV 85.4 80.0 - 100.0 fL   MCH 24.9 (L) 26.0 -  34.0 pg   MCHC 29.1 (L) 30.0 - 36.0 g/dL   RDW 16.1 (H) 09.6 - 04.5 %   Platelets 407 (H) 150 - 400 K/uL   nRBC 0.0 0.0 - 0.2 %   Neutrophils Relative % 78 %   Neutro Abs 7.9 (H) 1.7 - 7.7 K/uL   Lymphocytes Relative 15 %   Lymphs Abs 1.5 0.7 - 4.0 K/uL   Monocytes Relative 5 %   Monocytes Absolute 0.5 0.1 - 1.0 K/uL   Eosinophils Relative 1 %   Eosinophils Absolute 0.1 0.0 - 0.5 K/uL   Basophils Relative 0 %   Basophils Absolute 0.0 0.0 - 0.1 K/uL   Immature Granulocytes 1 %   Abs Immature Granulocytes 0.05 0.00 - 0.07 K/uL    Comment: Performed at [facility], [address] [place], [place] [address]  Comprehensive metabolic panel      Status: Abnormal   Collection Time: 04/03/23  9:05 AM  Result Value Ref Range   Sodium 134 (L) 135 - 145 mmol/L   Potassium 3.7 3.5 - 5.1 mmol/L   Chloride 101 98 - 111 mmol/L   CO2 23 22 - 32 mmol/L   Glucose, Bld 142 (H) 70 - 99 mg/dL    Comment: Glucose reference range applies only to samples taken after fasting for at least 8 hours.   BUN 26 (H) 6 - 20 mg/dL   Creatinine, Ser 1.91 (H) 0.44 - 1.00 mg/dL   Calcium 8.4 (L) 8.9 - 10.3 mg/dL   Total Protein 7.0 6.5 - 8.1 g/dL   Albumin 2.4 (L) 3.5 - 5.0 g/dL   AST 9 (L) 15 - 41 U/L   ALT 9 0 - 44 U/L   Alkaline Phosphatase 105 38 - 126 U/L   Total Bilirubin 0.5 <1.2 mg/dL   GFR, Estimated 30 (L) >60 mL/min    Comment: (NOTE) Calculated using the CKD-EPI Creatinine Equation (2021)    Anion gap 10 5 - 15    Comment: Performed at [facility], [address], [place], [place] [address]  Troponin I (High Sensitivity)     Status: Abnormal   Collection Time: 04/03/23  9:05 AM  Result Value Ref Range   Troponin I (High Sensitivity) 34 (H) <18 ng/L    Comment: (NOTE) Elevated high sensitivity troponin I (hsTnI) values and significant  changes across serial measurements may suggest ACS but many other  chronic and acute conditions are known to elevate hsTnI results.  Refer to the "Links" section for chest pain algorithms and additional  guidance. Performed at [facility] Lab, [address] [place], [place] [address]   Brain natriuretic peptide     Status: Abnormal   Collection Time: 04/03/23  9:05 AM  Result Value Ref Range   B Natriuretic Peptide 1,033.9 (H) 0.0 - 100.0 pg/mL    Comment: Performed at [facility] Lab, [address], [place], [place] [address]  Prepare RBC (crossmatch)     Status: None   Collection Time: 04/03/23 11:00 AM  Result Value Ref Range   Order Confirmation      ORDER PROCESSED BY BLOOD BANK Performed at [facility], [address] [place], [place] [address]   Type and screen  [facility]     Status: None (Preliminary result)   Collection Time: 04/03/23 11:02 AM  Result Value Ref Range   ABO/RH(D) O POS    Antibody Screen NEG  Sample Expiration 04/06/2023,2359    Unit Number [id number]    Blood Component Type RED CELLS,LR    Unit division 00    Status of Unit ISSUED    Transfusion Status OK TO TRANSFUSE    Crossmatch Result Compatible    Unit Number [id number]    Blood Component Type RBC LR PHER1    Unit division 00    Status of Unit ISSUED    Transfusion Status OK TO TRANSFUSE    Crossmatch Result      Compatible Performed at [facility] Lab, [address], [place], [place] [address]    Unit Number [id number]    Blood Component Type RED CELLS,LR    Unit division 00    Status of Unit ALLOCATED    Transfusion Status OK TO TRANSFUSE    Crossmatch Result Compatible   Lactate dehydrogenase     Status: None   Collection Time: 04/03/23  4:18 PM  Result Value Ref Range   LDH 109 98 - 192 U/L    Comment: Performed at [facility], [address], [place], [place] [address]  Troponin I (High Sensitivity)     Status: Abnormal   Collection Time: 04/03/23  4:22 PM  Result Value Ref Range   Troponin I (High Sensitivity) 37 (H) <18 ng/L    Comment: (NOTE) Elevated high sensitivity troponin I (hsTnI) values and significant  changes across serial measurements may suggest ACS but many other  chronic and acute conditions are known to elevate hsTnI results.  Refer to the "Links" section for chest pain algorithms and additional  guidance. Performed at [facility], [address], [place], [place] [address]   Haptoglobin     Status: Abnormal   Collection Time: 04/03/23  4:22 PM  Result Value Ref Range   Haptoglobin 421 (H) 33 - 346 mg/dL    Comment: (NOTE) Performed At: [facility] [address] [place], [place] [id number] Jolene Schimke MD SW:[id number]   Ferritin     Status: None   Collection Time:  04/03/23  4:22 PM  Result Value Ref Range   Ferritin 67 11 - 307 ng/mL    Comment: Performed at [facility] Lab, [address], [place], [place] [address]  Iron and TIBC     Status: Abnormal   Collection Time: 04/03/23  4:22 PM  Result Value Ref Range   Iron 26 (L) 28 - 170 ug/dL   TIBC 427 (L) 062 - 376 ug/dL   Saturation Ratios 12 10.4 - 31.8 %   UIBC 188 ug/dL    Comment: Performed at [facility], [address], [place], [place] [address]  CBG monitoring, ED     Status: Abnormal   Collection Time: 04/03/23  5:28 PM  Result Value Ref Range   Glucose-Capillary 121 (H) 70 - 99 mg/dL    Comment: Glucose reference range applies only to samples taken after fasting for at least 8 hours.  CBG monitoring, ED     Status: Abnormal   Collection Time: 04/03/23 11:06 PM  Result Value Ref Range   Glucose-Capillary 115 (H) 70 - 99 mg/dL    Comment: Glucose reference range applies only to samples taken after fasting for at least 8 hours.  Comprehensive metabolic panel     Status: Abnormal   Collection Time: 04/04/23  4:42 AM  Result Value Ref Range   Sodium 135 135 - 145 mmol/L   Potassium 3.7 3.5 - 5.1  mmol/L   Chloride 104 98 - 111 mmol/L   CO2 22 22 - 32 mmol/L   Glucose, Bld 111 (H) 70 - 99 mg/dL    Comment: Glucose reference range applies only to samples taken after fasting for at least 8 hours.   BUN 33 (H) 6 - 20 mg/dL   Creatinine, Ser 9.51 (H) 0.44 - 1.00 mg/dL   Calcium 8.3 (L) 8.9 - 10.3 mg/dL   Total Protein 5.9 (L) 6.5 - 8.1 g/dL   Albumin 2.1 (L) 3.5 - 5.0 g/dL   AST 8 (L) 15 - 41 U/L   ALT 7 0 - 44 U/L   Alkaline Phosphatase 88 38 - 126 U/L   Total Bilirubin 0.7 <1.2 mg/dL   GFR, Estimated 32 (L) >60 mL/min    Comment: (NOTE) Calculated using the CKD-EPI Creatinine Equation (2021)    Anion gap 9 5 - 15    Comment: Performed at [facility], [address] [place], [place] [address]  CBC     Status: Abnormal   Collection Time: 04/04/23  4:42 AM   Result Value Ref Range   WBC 7.0 4.0 - 10.5 K/uL   RBC 2.51 (L) 3.87 - 5.11 MIL/uL   Hemoglobin 6.3 (LL) 12.0 - 15.0 g/dL    Comment: CRITICAL VALUE NOTED.  VALUE IS CONSISTENT WITH PREVIOUSLY REPORTED AND CALLED VALUE. REPEATED TO VERIFY    HCT 21.4 (L) 36.0 - 46.0 %   MCV 85.3 80.0 - 100.0 fL   MCH 25.1 (L) 26.0 - 34.0 pg   MCHC 29.4 (L) 30.0 - 36.0 g/dL   RDW 60.6 (H) 30.1 - 60.1 %   Platelets 297 150 - 400 K/uL   nRBC 0.0 0.0 - 0.2 %    Comment: Performed at [facility] Lab, [address], [place], [place] [address]  CBG monitoring, ED     Status: None   Collection Time: 04/04/23  7:42 AM  Result Value Ref Range   Glucose-Capillary 97 70 - 99 mg/dL    Comment: Glucose reference range applies only to samples taken after fasting for at least 8 hours.  Prepare RBC (crossmatch)     Status: None   Collection Time: 04/04/23  8:29 AM  Result Value Ref Range   Order Confirmation      ORDER PROCESSED BY BLOOD BANK Performed at [facility], [address], [place], [place] [address]    DG Chest Port 1 View  Result Date: 04/03/2023 CLINICAL DATA:  Short of breath. EXAM: PORTABLE CHEST 1 VIEW COMPARISON:  08/18/2022. FINDINGS: Cardiac silhouette normal in size.  No mediastinal or hilar masses. There is vascular congestion and bilateral interstitial thickening most evident in the lower lungs. Hazy opacity at the lung bases suggests small effusions. No pneumothorax. Skeletal structures are grossly intact. IMPRESSION: 1. Interstitial thickening and small effusions. Suspect interstitial pulmonary edema. No convincing pneumonia. . Electronically Signed   By: Amie Portland M.D.   On: 04/03/2023 10:00    Pending Labs Unresulted Labs (From admission, onward)     Start     Ordered   Pending  Basic metabolic panel  Once,   R        Pending   Pending  CBC  Once,   R        Pending   Pending  Urinalysis, Routine w reflex microscopic -  Once,   R        Pending  Vitals/Pain Today's Vitals   04/04/23 1021 04/04/23 1024 04/04/23 1030 04/04/23 1036  BP: (!) 107/53 (!) 97/52 (!) 106/54 (!) 107/59  Pulse: 78  78 77  Resp: (!) 22 16 19 19   Temp: 98 F (36.7 C)   98.3 F (36.8 C)  TempSrc: Oral   Oral  SpO2: 100%  100% 97%  PainSc:        Isolation Precautions No active isolations  Medications Medications  aspirin EC tablet 81 mg (81 mg Oral Given 04/04/23 1022)  levothyroxine (SYNTHROID) tablet 150 mcg (150 mcg Oral Given 04/04/23 0630)  clopidogrel (PLAVIX) tablet 75 mg (75 mg Oral Given 04/04/23 1021)  gabapentin (NEURONTIN) capsule 300 mg (300 mg Oral Given 04/03/23 2309)  pantoprazole (PROTONIX) EC tablet 40 mg (40 mg Oral Given 04/04/23 1019)  sodium chloride flush (NS) 0.9 % injection 3 mL (0 mLs Intravenous Hold 04/04/23 1024)  acetaminophen (TYLENOL) tablet 650 mg (has no administration in time range)    Or  acetaminophen (TYLENOL) suppository 650 mg (has no administration in time range)  polyethylene glycol (MIRALAX / GLYCOLAX) packet 17 g (has no administration in time range)  atorvastatin (LIPITOR) tablet 40 mg (40 mg Oral Given 04/04/23 1022)  insulin aspart (novoLOG) injection 0-15 Units ( Subcutaneous Not Given 04/04/23 0815)  insulin glargine-yfgn (SEMGLEE) injection 10 Units (10 Units Subcutaneous Given 04/03/23 2309)  0.9 %  sodium chloride infusion (Manually program via Guardrails IV Fluids) (has no administration in time range)  furosemide (LASIX) injection 40 mg (40 mg Intravenous Given 04/04/23 0957)  0.9 %  sodium chloride infusion (Manually program via Guardrails IV Fluids) (0 mLs Intravenous Stopped 04/03/23 1503)  potassium chloride SA (KLOR-CON M) CR tablet 40 mEq (40 mEq Oral Given 04/04/23 1021)    Mobility walks     Focused Assessments Cardiac Assessment Handoff:    No results found for: "CKTOTAL", "CKMB", "CKMBINDEX", "TROPONINI" Lab Results  Component Value Date   DDIMER 1.21 (H) 02/04/2022   Does the  Patient currently have chest pain? No    R Recommendations: See Admitting Provider Note  Report given to:   Additional Notes: 2nd blood unit ordered, current unit transfusing

## 2023-04-04 NOTE — Progress Notes (Addendum)
Patient Name: Kayla Schmitt Date of Encounter: 04/04/2023 [facility] Cardiologist: Marjo Bicker, MD   Interval Summary  .    Sitting up in bed, actually feels better this morning despite no improvement in Hgb  Vital Signs .    Vitals:   04/04/23 0715 04/04/23 0730 04/04/23 0745 04/04/23 0757  BP:   107/64   Pulse: 80 75 75   Resp: (!) 21 12 17    Temp:    98.1 F (36.7 C)  TempSrc:    Oral  SpO2: 97% 100% 91%     Intake/Output Summary (Last 24 hours) at 04/04/2023 0844 Last data filed at 04/03/2023 1503 Gross per 24 hour  Intake 412.5 ml  Output --  Net 412.5 ml      03/26/2023   11:46 AM 01/01/2023    3:32 PM 11/30/2022    5:54 AM  Last 3 Weights  Weight (lbs) 191 lb 9.6 oz 200 lb 9.6 oz 200 lb  Weight (kg) 86.909 kg 90.992 kg 90.719 kg      Telemetry/ECG    Sinus Rhythm - Personally Reviewed  Physical Exam .   GEN: No acute distress.   Neck: No JVD Cardiac: RRR, 3/6 RUSB murmur, no rubs, or gallops.  Respiratory: Clear to auscultation bilaterally. GI: Soft, nontender, non-distended  MS: 1+ bilateral LE edema, bilateral TMAs  Assessment & Plan .     Dyspnea -- presenting complaint that's felt to be multifactorial in nature with combination of severe anemia, CAD, valvular heart disease and heart failure -- Does feel somewhat improved this morning despite no improvement in her hemoglobin  Anemia -- Etiology for anemia unclear -- She previously ran a baseline hemoglobin between 9 and 11 back 2022 2023 -- Most recently hemoglobin levels have run between 7 and 8, FOBT negative in prior admissions -- Presenting hemoglobin of 6.5 s/p 1 unit PRBCs, no improvement as hemoglobin is 6.3 this morning -- Plan for an additional 2 units PRBCs -- No records of prior endoscopic work up, pending GI workup per primary  CAD -- Known history of severe calcific three-vessel CAD.  Has been followed by CT surgery as an outpatient and managed medically  while she is undergoing treatment for her lower extremity wounds and anemia -- hsTn 34>>37 -- Denies any chest pain PTA -- will eventually need CABG/+ AVR possible MVR but not currently a candidate with multiple co-morbid conditions   Valvular heart disease HFpEF -- Echo 09/2022 with moderate MR though possible underestimation given eccentric jet.  Moderate aortic stenosis with mean gradient of 21 mmHg, aortic valve area of 1.2 cm -- BNP 1033, CXR interstitial thickening and small effusions -- on IV lasix 40mg  BID  -- repeat echo  PAD History of cellulitis/osteomyelitis -- Significant history with prior limb ischemia, s/p bilateral TMA's -- On aspirin and Plavix  AKI -- baseline Cr around 1.0-1.3, up to 1.9 on admission. Improved to 1.8 today -- has been having GI symptoms, vomiting -- follow BMET  For questions or updates, please contact [facility] Please consult [email or web address] for contact info under        Signed, Laverda Page, NP  Agree with note by Laverda Page NP-C  Pt feels clinically improved after 1 unit PRBCs despit no change in HGB. Getting 1-2 additional units. May be able to get away with med Rx of CAD and valvular HD for now once transfused but need to determine etiology of persistent anemia. SCr slightly improved. Back  on diuretic. Repeat 2 D pending. Will continue to follow.  Runell Gess, M.D., FACP, [facility], [facility], [facility] [address] [place], [place]  [address]  [phone] 04/04/2023 12:39 PM

## 2023-04-04 NOTE — Consult Note (Signed)
Referring Provider: Dr. Zannie Cove  Primary Care Physician:  Medicine, [facility] Internal Primary Gastroenterologist: Gentry Fitz   Reason for Consultation: Iron deficiency anemia  HPI: Kayla Schmitt is a 57 y.o. female with a past medical history of arthritis, hypertension, hypercholesterolemia, coronary artery disease, NSTEMI 07/2020, CHF, aortic stenosis, PAD s/p left SFA and above knee popliteal artery mechanical thrombectomy with angioplasty and stenting of left above knee popliteal artery and SFA, s/p right SFA and above knee popliteal artery laser atherectomy with DCBA of the right above knee popliteal artery and SFA on Plavix and ASA, cellulitis/osteomyelitis s/p bilateral transmetatarsal amputations, diabetes mellitus type 2, hypothyroidism and iron deficiency anemia.   She was previously admitted to the hospital  08/18/2022 with shortness of breath and was diagnosed with NSTEMI and systolic CHF. ECHO showed  LVEF of 40 to 45%, She underwent LHC which identified severe three-vessel CAD and severe MR. She was evaluated by our inpatient GI service 08/20/2022 due to significant anemia with a hemoglobin level 6.1, transfused 1 unit of PRBCs.  Stool was FOBT negative. Endoscopic evaluation was deferred in setting of recent NSTEMI on Plavix without overt GI bleeding at that time. An EGD and colonoscopy were recommended as an outpatient.    She developed generalized weakness, dizziness and shortness of breath for the past week which progressively worsened therefore she presented to the ED 04/03/2023 for further evaluation.  Labs in the ED showed a WBC count of 10.0.  Hemoglobin 6.5 down from 7.4 on 03/22/2023.  Hematocrit 22.3.  Platelet 407.  Sodium 134.  Potassium 3.7.  BUN 26.  Creatinine 1.91 up from 1.56 on 02/2021.  Albumin 2.4.  Total bili 0.5.  Alk phos 105.  AST 9.  ALT 9.  Troponin 34.  Iron 26.  TIBC 214.  Ferritin 67.  Haptoglobin 421.  Chest x-ray showed interstitial thickening and  small effusions with suspected interstitial pulmonary edema. Transfused 1 unit of PRBCs 12/4 -> Hg 6.3 at 4am today -< 2nd unit of PRBCs infusing at this time.  SOB has improved.  No chest pain.  GI consult was requested for further evaluation regarding iron deficiency anemia.  She endorsed having nausea, vomiting and LUQ pain a few weeks ago.  She described emesis as partially digested food to bilious emesis.  No coffee-ground or hematemesis.  Her symptoms persisted therefore she presented to [facility] ED 03/22/2023.  Labs in the ED showed a creatinine level elevated at 1.56 hemoglobin level 7.4.  Hematocrit 23.9.  CTAP with contrast 03/22/2023 was unrevealing. She was discharged home on Promethazine and Percocet and nausea/vomiting and abdominal pain abated within a few days without recurrence.  She has been on Ozempic for the past 1-1/2 to 2 years, last dose was approximately 2-1/2 weeks ago.  She has significant PAD status post bilateral toe amputation/vascular intervention 07/2022 as noted above on Plavix and ASA.  Last dose of Plavix was taken at 6 AM on 12/4.  She has not taken aspirin for the past week, she did not feel like taking it.  She passes a normal brown bowel movement every 1 to 2 days.  No rectal bleeding or black stools.  No heartburn or dysphagia.  She denies ever having an EGD or colonoscopy.  Her maternal aunt and uncle were diagnosed with colon cancer in their 28s as reported by the patient's mother who is at the bedside.  No alcohol use.  She stopped smoking cigarettes 7 or 8 years ago.  She smokes  marijuana every once in a while.  Echo 10/18/2022: IMPRESSIONS Left ventricular ejection fraction, by estimation, is 50 to 55%. The left ventricle has low normal function. The left ventricle has no regional wall motion abnormalities. There is mild left ventricular hypertrophy. Left ventricular diastolic parameters are consistent with Grade II diastolic dysfunction (pseudonormalization).  Elevated left atrial pressure. 1. Right ventricular systolic function is normal. The right ventricular size is normal. Tricuspid regurgitation signal is inadequate for assessing PA pressure. 2. The mitral valve is abnormal. Restricted posterior leaflet. Moderate mitral valve regurgitation. Eccentric MR jet, may be underestimating MR given eccentric jet but normal LA size and E=A wave argues against severe MR. Recent TEE showed moderate MR 3. The aortic valve is calcified. There is moderate calcification of the aortic valve. Aortic valve regurgitation is mild. Moderate aortic valve stenosis. Vmax 2.9 m/s, MG 21 mmHg, AVA 1.2 cm^2, DI 0.30 4. The inferior vena cava is normal in size with greater than 50% respiratory variability, suggesting right atrial pressure of 3 mmHg.  Past Medical History:  Diagnosis Date   Anemia    Aortic stenosis    Arthritis    CHF (congestive heart failure) (HCC)    Coronary artery disease    Diabetes mellitus without complication (HCC)    type 2   GERD (gastroesophageal reflux disease)    Heart murmur    History of blood transfusion    Hypercholesteremia    Hypertension    Hypothyroidism    Mitral regurgitation    Myocardial infarction (HCC)    Peripheral vascular disease (HCC)    PONV (postoperative nausea and vomiting)    Thyroid disease     Past Surgical History:  Procedure Laterality Date   ABDOMINAL AORTOGRAM W/LOWER EXTREMITY Bilateral 01/14/2020   Procedure: ABDOMINAL AORTOGRAM W/LOWER EXTREMITY;  Surgeon: Cephus Shelling, MD;  Location: [facility] INVASIVE CV LAB;  Service: Cardiovascular;  Laterality: Bilateral;   ABDOMINAL AORTOGRAM W/LOWER EXTREMITY N/A 07/05/2022   Procedure: ABDOMINAL AORTOGRAM W/LOWER EXTREMITY;  Surgeon: Cephus Shelling, MD;  Location: [facility];  Service: Cardiovascular;  Laterality: N/A;   ABDOMINAL AORTOGRAM W/LOWER EXTREMITY N/A 08/02/2022   Procedure: ABDOMINAL AORTOGRAM W/LOWER EXTREMITY;  Surgeon: Cephus Shelling, MD;  Location: [facility];  Service: Cardiovascular;  Laterality: N/A;   ABDOMINAL AORTOGRAM W/LOWER EXTREMITY N/A 08/06/2022   Procedure: ABDOMINAL AORTOGRAM W/LOWER EXTREMITY;  Surgeon: Cephus Shelling, MD;  Location: [facility];  Service: Cardiovascular;  Laterality: N/A;   AMPUTATION Bilateral 07/20/2022   Procedure: AMPUTATION RIGHT GREAT TOE AND SECOND TOE, AMPUTATION LEFT GREAT TOE;  Surgeon: Nadara Mustard, MD;  Location: [facility] OR;  Service: Orthopedics;  Laterality: Bilateral;   AMPUTATION Bilateral 08/08/2022   Procedure: BILATERAL TRANSMETATARSAL AMPUTATION;  Surgeon: Nadara Mustard, MD;  Location: [facility] OR;  Service: Orthopedics;  Laterality: Bilateral;   AMPUTATION TOE     PERIPHERAL VASCULAR ATHERECTOMY Right 01/14/2020   Procedure: PERIPHERAL VASCULAR ATHERECTOMY;  Surgeon: Cephus Shelling, MD;  Location: [facility] INVASIVE CV LAB;  Service: Cardiovascular;  Laterality: Right;  sfa   PERIPHERAL VASCULAR INTERVENTION Right 01/14/2020   Procedure: PERIPHERAL VASCULAR INTERVENTION;  Surgeon: Cephus Shelling, MD;  Location: [facility];  Service: Cardiovascular;  Laterality: Right;  sfa stent    PERIPHERAL VASCULAR INTERVENTION  07/05/2022   Procedure: PERIPHERAL VASCULAR INTERVENTION;  Surgeon: Cephus Shelling, MD;  Location: [facility];  Service: Cardiovascular;;   PERIPHERAL VASCULAR INTERVENTION  08/02/2022  Procedure: PERIPHERAL VASCULAR INTERVENTION;  Surgeon: Cephus Shelling, MD;  Location: [facility] INVASIVE CV LAB;  Service: Cardiovascular;;   PERIPHERAL VASCULAR INTERVENTION  08/06/2022   Procedure: PERIPHERAL VASCULAR INTERVENTION;  Surgeon: Cephus Shelling, MD;  Location: [facility] INVASIVE CV LAB;  Service: Cardiovascular;;   PERIPHERAL VASCULAR THROMBECTOMY  08/02/2022   Procedure: PERIPHERAL VASCULAR THROMBECTOMY;  Surgeon: Cephus Shelling, MD;  Location: [facility];  Service: Cardiovascular;;   RIGHT/LEFT HEART CATH AND CORONARY  ANGIOGRAPHY N/A 08/23/2022   Procedure: RIGHT/LEFT HEART CATH AND CORONARY ANGIOGRAPHY;  Surgeon: [name], MD;  Location: [facility] INVASIVE CV LAB;  Service: Cardiovascular;  Laterality: N/A;   SKIN SPLIT GRAFT Right 03/18/2020   Procedure: SKIN GRAFTING RIGHT FOOT ULCER;  Surgeon: Nadara Mustard, MD;  Location: [facility] OR;  Service: Orthopedics;  Laterality: Right;   STUMP REVISION Right 11/30/2022   Procedure: REVISION RIGHT TRANSMETATARSAL AMPUTATION;  Surgeon: Nadara Mustard, MD;  Location: [facility] OR;  Service: Orthopedics;  Laterality: Right;   TEE WITHOUT CARDIOVERSION N/A 08/27/2022   Procedure: TRANSESOPHAGEAL ECHOCARDIOGRAM;  Surgeon: [name], MD;  Location: [facility] INVASIVE CV LAB;  Service: Cardiovascular;  Laterality: N/A;   WISDOM TOOTH EXTRACTION      Prior to Admission medications   Medication Sig Start Date End Date Taking? Authorizing Provider  acetaminophen (TYLENOL) 500 MG tablet Take 1,000 mg by mouth 2 (two) times daily as needed for moderate pain (pain score 4-6), headache or fever.   Yes [provider]  amLODipine (NORVASC) 10 MG tablet Take 10 mg by mouth daily.   Yes [provider]  aspirin 81 MG EC tablet Take 81 mg by mouth daily.   Yes [provider]  atorvastatin (LIPITOR) 40 MG tablet Take 40 mg by mouth daily. 12/14/19  Yes [provider]  clopidogrel (PLAVIX) 75 MG tablet TAKE 1 TABLET BY MOUTH EVERY DAY WITH BREAKFAST 01/28/23  Yes Maeola Harman, MD  diphenhydrAMINE HCl, Sleep, (ZZZQUIL) 25 MG CAPS Take 25 mg by mouth at bedtime as needed (Sleep).   Yes [provider]  furosemide (LASIX) 40 MG tablet Take 1 tablet (40 mg total) by mouth 2 (two) times daily. 08/27/22  Yes Willeen Niece, MD  gabapentin (NEURONTIN) 300 MG capsule Take 300 mg by mouth at bedtime.   Yes [provider]  insulin degludec (TRESIBA FLEXTOUCH) 100 UNIT/ML FlexTouch Pen Inject 10 Units into the skin at  bedtime. Patient taking differently: Inject 16 Units into the skin at bedtime. 01/15/20  Yes Danford, Earl Lites, MD  levothyroxine (SYNTHROID) 150 MCG tablet Take 150 mcg by mouth daily before breakfast. 12/02/19  Yes [provider]  lisinopril (ZESTRIL) 2.5 MG tablet Take 1 tablet (2.5 mg total) by mouth daily. 11/06/22  Yes Mallipeddi, Vishnu P, MD  metoprolol succinate (TOPROL-XL) 25 MG 24 hr tablet Take 0.5 tablets (12.5 mg total) by mouth daily. 08/28/22  Yes Willeen Niece, MD  nitroGLYCERIN (NITROSTAT) 0.4 MG SL tablet Place 1 tablet (0.4 mg total) under the tongue every 5 (five) minutes x 3 doses as needed for chest pain (if no relief after 3rd dose proceed to ED or call 911). 11/06/2022-New 11/06/22  Yes Mallipeddi, Vishnu P, MD  oxyCODONE-acetaminophen (PERCOCET/ROXICET) 5-325 MG tablet Take 1 tablet by mouth every 6 (six) hours as needed for severe pain (pain score 7-10). 03/22/23  Yes Terrilee Files, MD  pantoprazole (PROTONIX) 40 MG tablet Take 40 mg by mouth daily. 12/21/19  Yes [provider]  promethazine (PHENERGAN) 25 MG tablet Take 1 tablet (25 mg total) by mouth every 6 (six) hours as needed for nausea or vomiting. 03/22/23  Yes Terrilee Files, MD  sulfamethoxazole-trimethoprim (BACTRIM DS) 800-160 MG tablet Take 1 tablet by mouth 2 (two) times daily. Patient not taking: Reported on 04/03/2023 03/18/23   Nadara Mustard, MD    Current Facility-Administered Medications  Medication Dose Route Frequency Provider Last Rate Last Admin   0.9 %  sodium chloride infusion (Manually program via Guardrails IV Fluids)   Intravenous Once Zannie Cove, MD       acetaminophen (TYLENOL) tablet 650 mg  650 mg Oral Q6H PRN Synetta Fail, MD       Or   acetaminophen (TYLENOL) suppository 650 mg  650 mg Rectal Q6H PRN Synetta Fail, MD       aspirin EC tablet 81 mg  81 mg Oral Daily Synetta Fail, MD   81 mg at 04/04/23 1022   atorvastatin (LIPITOR) tablet  40 mg  40 mg Oral Daily Synetta Fail, MD   40 mg at 04/04/23 1022   clopidogrel (PLAVIX) tablet 75 mg  75 mg Oral Daily Synetta Fail, MD   75 mg at 04/04/23 1021   furosemide (LASIX) injection 40 mg  40 mg Intravenous BID Zannie Cove, MD   40 mg at 04/04/23 0957   gabapentin (NEURONTIN) capsule 300 mg  300 mg Oral QHS Synetta Fail, MD   300 mg at 04/03/23 2309   insulin aspart (novoLOG) injection 0-15 Units  0-15 Units Subcutaneous TID [facility] Synetta Fail, MD   2 Units at 04/03/23 1845   insulin glargine-yfgn (SEMGLEE) injection 10 Units  10 Units Subcutaneous QHS Synetta Fail, MD   10 Units at 04/03/23 2309   levothyroxine (SYNTHROID) tablet 150 mcg  150 mcg Oral Q0600 Synetta Fail, MD   150 mcg at 04/04/23 0630   pantoprazole (PROTONIX) EC tablet 40 mg  40 mg Oral Daily Synetta Fail, MD   40 mg at 04/04/23 1019   polyethylene glycol (MIRALAX / GLYCOLAX) packet 17 g  17 g Oral Daily PRN Synetta Fail, MD       sodium chloride flush (NS) 0.9 % injection 3 mL  3 mL Intravenous Q12H Synetta Fail, MD   3 mL at 04/03/23 2200   Current Outpatient Medications  Medication Sig Dispense Refill   acetaminophen (TYLENOL) 500 MG tablet Take 1,000 mg by mouth 2 (two) times daily as needed for moderate pain (pain score 4-6), headache or fever.     amLODipine (NORVASC) 10 MG tablet Take 10 mg by mouth daily.     aspirin 81 MG EC tablet Take 81 mg by mouth daily.     atorvastatin (LIPITOR) 40 MG tablet Take 40 mg by mouth daily.     clopidogrel (PLAVIX) 75 MG tablet TAKE 1 TABLET BY MOUTH EVERY DAY WITH BREAKFAST 90 tablet 3   diphenhydrAMINE HCl, Sleep, (ZZZQUIL) 25 MG CAPS Take 25 mg by mouth at bedtime as needed (Sleep).     furosemide (LASIX) 40 MG tablet Take 1 tablet (40 mg total) by mouth 2 (two) times daily. 30 tablet 3   gabapentin (NEURONTIN) 300 MG capsule Take 300 mg by mouth at bedtime.     insulin degludec (TRESIBA FLEXTOUCH) 100  UNIT/ML FlexTouch Pen Inject 10 Units into the skin at bedtime. (Patient taking differently: Inject 16 Units into the skin at  bedtime.) 3 mL 3   levothyroxine (SYNTHROID) 150 MCG tablet Take 150 mcg by mouth daily before breakfast.     lisinopril (ZESTRIL) 2.5 MG tablet Take 1 tablet (2.5 mg total) by mouth daily. 90 tablet 2   metoprolol succinate (TOPROL-XL) 25 MG 24 hr tablet Take 0.5 tablets (12.5 mg total) by mouth daily. 30 tablet 1   nitroGLYCERIN (NITROSTAT) 0.4 MG SL tablet Place 1 tablet (0.4 mg total) under the tongue every 5 (five) minutes x 3 doses as needed for chest pain (if no relief after 3rd dose proceed to ED or call 911). 11/06/2022-New 25 tablet 1   oxyCODONE-acetaminophen (PERCOCET/ROXICET) 5-325 MG tablet Take 1 tablet by mouth every 6 (six) hours as needed for severe pain (pain score 7-10). 15 tablet 0   pantoprazole (PROTONIX) 40 MG tablet Take 40 mg by mouth daily.     promethazine (PHENERGAN) 25 MG tablet Take 1 tablet (25 mg total) by mouth every 6 (six) hours as needed for nausea or vomiting. 30 tablet 0   sulfamethoxazole-trimethoprim (BACTRIM DS) 800-160 MG tablet Take 1 tablet by mouth 2 (two) times daily. (Patient not taking: Reported on 04/03/2023) 40 tablet 0    Allergies as of 04/03/2023 - Review Complete 04/03/2023  Allergen Reaction Noted   Bactrim ds [sulfamethoxazole-trimethoprim] Nausea Only and Other (See Comments) 04/03/2023   Ozempic (0.25 or 0.5 mg-dose) [semaglutide(0.25 or 0.5mg -dos)] Other (See Comments) 04/03/2023   Trental [pentoxifylline] Nausea And Vomiting 03/18/2020   Vibramycin [doxycycline] Nausea And Vomiting 01/11/2020   Penicillins Rash 01/11/2020    Family History  Problem Relation Age of Onset   Diabetes Other    CAD Other     Social History   Socioeconomic History   Marital status: Divorced    Spouse name: Not on file   Number of children: 0   Years of education: Not on file   Highest education level: Not on file   Occupational History   Not on file  Tobacco Use   Smoking status: Former    Current packs/day: 0.00    Types: Cigarettes    Quit date: 01/2017    Years since quitting: 6.1   Smokeless tobacco: Never  Vaping Use   Vaping status: Never Used  Substance and Sexual Activity   Alcohol use: Not Currently   Drug use: Yes    Types: Marijuana    Comment: on ocassion- Last time - 11/13/22   Sexual activity: Yes    Birth control/protection: Post-menopausal  Other Topics Concern   Not on file  Social History Narrative   Not on file   Social Determinants of Health   Financial Resource Strain: Not on file  Food Insecurity: No Food Insecurity (08/20/2022)   Hunger Vital Sign    Worried About Running Out of Food in the Last Year: Never true    Ran Out of Food in the Last Year: Never true  Transportation Needs: No Transportation Needs (08/20/2022)   PRAPARE - Administrator, Civil Service (Medical): No    Lack of Transportation (Non-Medical): No  Physical Activity: Not on file  Stress: Not on file  Social Connections: Not on file  Intimate Partner Violence: Not At Risk (08/20/2022)   Humiliation, Afraid, Rape, and Kick questionnaire    Fear of Current or Ex-Partner: No    Emotionally Abused: No    Physically Abused: No    Sexually Abused: No    Review of Systems: Gen: Denies fever, sweats or chills. No  weight loss.  CV: Denies chest pain, palpitations or edema. Resp: Denies cough, shortness of breath of hemoptysis.  GI: See HPI.   GU : Denies urinary burning, blood in urine, increased urinary frequency or incontinence. MS: Denies joint pain, muscles aches or weakness. Derm: Denies rash, itchiness, skin lesions or unhealing ulcers. Psych: Denies depression, anxiety, memory loss or confusion. Heme: + Easy bruising.  Neuro:  Denies headaches, dizziness or paresthesias. Endo:  + DM type II and thyroid disease.  Physical Exam: Vital signs in last 24 hours: Temp:  [97.7  F (36.5 C)-99.3 F (37.4 C)] 98.5 F (36.9 C) (12/05 1000) Pulse Rate:  [34-85] 80 (12/05 1000) Resp:  [11-40] 20 (12/05 1000) BP: (87-113)/(47-83) 113/58 (12/05 1000) SpO2:  [91 %-100 %] 100 % (12/05 1000)   General: Alert 57 year old female in no acute distress. Head:  Normocephalic and atraumatic. Eyes:  No scleral icterus. Conjunctiva pink. Ears:  Normal auditory acuity. Nose:  No deformity, discharge or lesions. Mouth: Upper dentures.  No ulcers or lesions.  Neck:  Supple. No lymphadenopathy or thyromegaly.  Lungs: Breath sounds clear in the upper fields with crackles in the right and left lower base. Heart: Regular rhythm, 3/6 systolic murmur. Abdomen: Soft, nondistended.  Nontender.  Positive bowel sounds to all 4 quadrants.  No hepatosplenomegaly.  No bruit. Rectal: Deferred. Musculoskeletal:  Symmetrical without gross deformities.  Pulses:  Normal pulses noted. Extremities:  Without clubbing or edema. Neurologic:  Alert and  oriented x 4. No focal deficits.  Skin:  Intact without significant lesions or rashes. Psych:  Alert and cooperative. Normal mood and affect.  Intake/Output from previous day: 12/04 0701 - 12/05 0700 In: 412.5 [I.V.:76.5; Blood:336] Out: -  Intake/Output this shift: No intake/output data recorded.  Lab Results: Recent Labs    04/03/23 0905 04/04/23 0442  WBC 10.0 7.0  HGB 6.5* 6.3*  HCT 22.3* 21.4*  PLT 407* 297   BMET Recent Labs    04/03/23 0905 04/04/23 0442  NA 134* 135  K 3.7 3.7  CL 101 104  CO2 23 22  GLUCOSE 142* 111*  BUN 26* 33*  CREATININE 1.91* 1.82*  CALCIUM 8.4* 8.3*   LFT Recent Labs    04/04/23 0442  PROT 5.9*  ALBUMIN 2.1*  AST 8*  ALT 7  ALKPHOS 88  BILITOT 0.7   PT/INR No results for input(s): "LABPROT", "INR" in the last 72 hours. Hepatitis Panel No results for input(s): "HEPBSAG", "HCVAB", "HEPAIGM", "HEPBIGM" in the last 72 hours.    Studies/Results: DG Chest Port 1 View  Result Date:  04/03/2023 CLINICAL DATA:  Short of breath. EXAM: PORTABLE CHEST 1 VIEW COMPARISON:  08/18/2022. FINDINGS: Cardiac silhouette normal in size.  No mediastinal or hilar masses. There is vascular congestion and bilateral interstitial thickening most evident in the lower lungs. Hazy opacity at the lung bases suggests small effusions. No pneumothorax. Skeletal structures are grossly intact. IMPRESSION: 1. Interstitial thickening and small effusions. Suspect interstitial pulmonary edema. No convincing pneumonia. . Electronically Signed   By: Amie Portland M.D.   On: 04/03/2023 10:00    IMPRESSION/PLAN:  57 year old female with CAD s/p NSTEMI 07/2022, AS and MR admitted to the hospital with generalized weakness and shortness of breath secondary to acute on chronic combined systolic and diastolic CHF.  LVEF 50 to 55% per ECHO 09/2022.  IDA likely contributing to SOB.  -Cardiology consulted, holding off on diuresis, ordered Plavix and ASA -Updated ECHO ordered  Acute on chronic iron deficiency anemia,  no overt GI bleeding.  On Plavix and ASA. Transfused 1 unit of PRBCs 12/4 -> Hg 6.3 at 4am today -< 2nd unit of PRBCs infusing at this time.  Patient never underwent an EGD or screening colonoscopy.  Maternal aunt and uncle with history of colon cancer. -Await posttransfusion H&H -Transfuse for hemoglobin level < 8 -Continue Pantoprazole 40 mg p.o. daily -Cardiac clearance required prior to pursuing EGD/colonoscopy -Further recommendations per Dr. Chinita Pester on CKD, Cr 1.91 -> 1.82  PAD s/p bilateral LE vascular intervention/stenting on Plavix and ASA.  Last dose of Plavix was 8 AM on 12/4.  Patient has not taken ASA for the past week. Plavix and ASA ordered by cardiology.   DM type II. Last dose of Ozempic was 2 and half weeks ago.  Malachi Carl Kennedy-Smith  04/04/2023, 11:49 AM

## 2023-04-05 DIAGNOSIS — I350 Nonrheumatic aortic (valve) stenosis: Secondary | ICD-10-CM | POA: Diagnosis not present

## 2023-04-05 DIAGNOSIS — Z7902 Long term (current) use of antithrombotics/antiplatelets: Secondary | ICD-10-CM | POA: Diagnosis not present

## 2023-04-05 DIAGNOSIS — D509 Iron deficiency anemia, unspecified: Principal | ICD-10-CM

## 2023-04-05 DIAGNOSIS — D649 Anemia, unspecified: Secondary | ICD-10-CM | POA: Diagnosis not present

## 2023-04-05 DIAGNOSIS — I5043 Acute on chronic combined systolic (congestive) and diastolic (congestive) heart failure: Secondary | ICD-10-CM | POA: Diagnosis not present

## 2023-04-05 LAB — BPAM RBC
Blood Product Expiration Date: 202412192359
Blood Product Expiration Date: 202412202359
Blood Product Expiration Date: 202412262359
ISSUE DATE / TIME: 202412041210
ISSUE DATE / TIME: 202412050940
ISSUE DATE / TIME: 202412051538
Unit Type and Rh: 5100
Unit Type and Rh: 5100
Unit Type and Rh: 5100

## 2023-04-05 LAB — TYPE AND SCREEN
ABO/RH(D): O POS
Antibody Screen: NEGATIVE
Unit division: 0
Unit division: 0
Unit division: 0

## 2023-04-05 LAB — CBC
HCT: 26.5 % — ABNORMAL LOW (ref 36.0–46.0)
Hemoglobin: 8.5 g/dL — ABNORMAL LOW (ref 12.0–15.0)
MCH: 26.6 pg (ref 26.0–34.0)
MCHC: 32.1 g/dL (ref 30.0–36.0)
MCV: 82.8 fL (ref 80.0–100.0)
Platelets: 307 10*3/uL (ref 150–400)
RBC: 3.2 MIL/uL — ABNORMAL LOW (ref 3.87–5.11)
RDW: 15.9 % — ABNORMAL HIGH (ref 11.5–15.5)
WBC: 6.3 10*3/uL (ref 4.0–10.5)
nRBC: 0 % (ref 0.0–0.2)

## 2023-04-05 LAB — GLUCOSE, CAPILLARY
Glucose-Capillary: 117 mg/dL — ABNORMAL HIGH (ref 70–99)
Glucose-Capillary: 92 mg/dL (ref 70–99)
Glucose-Capillary: 103 mg/dL — ABNORMAL HIGH (ref 70–99)
Glucose-Capillary: 87 mg/dL (ref 70–99)

## 2023-04-05 LAB — BASIC METABOLIC PANEL
Anion gap: 11 (ref 5–15)
BUN: 29 mg/dL — ABNORMAL HIGH (ref 6–20)
CO2: 21 mmol/L — ABNORMAL LOW (ref 22–32)
Calcium: 8.4 mg/dL — ABNORMAL LOW (ref 8.9–10.3)
Chloride: 100 mmol/L (ref 98–111)
Creatinine, Ser: 1.62 mg/dL — ABNORMAL HIGH (ref 0.44–1.00)
GFR, Estimated: 37 mL/min — ABNORMAL LOW (ref >60)
Glucose, Bld: 198 mg/dL — ABNORMAL HIGH (ref 70–99)
Potassium: 3.6 mmol/L (ref 3.5–5.1)
Sodium: 132 mmol/L — ABNORMAL LOW (ref 135–145)

## 2023-04-05 MED ORDER — PEG-KCL-NACL-NASULF-NA ASC-C 100 G PO SOLR
0.5000 | Freq: Once | ORAL | Status: AC
  Administered 2023-04-05: 100 g via ORAL
  Filled 2023-04-05: qty 1

## 2023-04-05 MED ORDER — FUROSEMIDE 10 MG/ML IJ SOLN
40.0000 mg | Freq: Every day | INTRAMUSCULAR | Status: DC
  Administered 2023-04-06: 40 mg via INTRAVENOUS
  Filled 2023-04-05: qty 4

## 2023-04-05 MED ORDER — POTASSIUM CHLORIDE CRYS ER 20 MEQ PO TBCR
40.0000 meq | EXTENDED_RELEASE_TABLET | Freq: Two times a day (BID) | ORAL | Status: AC
  Administered 2023-04-05 (×2): 40 meq via ORAL
  Filled 2023-04-05 (×2): qty 2

## 2023-04-05 MED ORDER — PEG-KCL-NACL-NASULF-NA ASC-C 100 G PO SOLR: 1.0000 | Freq: Once | ORAL | Status: DC

## 2023-04-05 NOTE — Progress Notes (Signed)
PROGRESS NOTE    Kayla Schmitt  ZOX:[id number] DOB: Sep 25, 1965 DOA: 04/03/2023 PCP: [facility], [facility]   57/F with severe multivessel CAD, aortic stenosis, chronic anemia, hypertension, diabetes, dyslipidemia, PAD with transmetatarsal amputations and foot wound presented to the ED with 2 weeks of ongoing dyspnea on exertion.  Prior to this had nausea and vomiting for the few days which was self-limiting. -In the ED blood pressure soft, sodium 134, creatinine 1.9, albumin 2.4, hemoglobin 6.5, baseline hemoglobin ranging from 6.5-7.8 range for this past year. -Admitted, started on diuretics, transfused 1 unit PRBC -12/5, transfused 2 more units of PRBC, GI consulted, plan for EGD colonoscopy  Subjective: Feels well, denies any complaints, starting prep for EGD/colonoscopy  Assessment and Plan:  Acute on chronic combined systolic and diastolic CHF -Last echo 6/24 noted EF 50-55% grade 2 DD, normal RV, moderate aortic stenosis moderate MR -Now with volume overload, compounded by worsening anemia -Cards following, continue IV Lasix 1 more day -Repeat echo with EF down to 45-50%, moderate MR/AAS -Per cards conservative management recommended at this time -GDMT limited by AKI  Moderate aortic stenosis -Noted, follow-up with cardiology  CAD > Cath this year showed significant multivessel disease and patient was referred to CT surgery.  CT surgery held off on further evaluation/consideration until patient lower extremity wounds were able to be addressed/healed. - Appreciate cardiology recommendations and assistance - Continue home aspirin, Plavix, statin - Holding lisinopril   Acute on chronic iron deficiency anemia -Hemoglobin has ranged from 6.5-7.8 this year -No history of overt blood loss -Anemia panel with iron deficiency -Transfused 3 units of PRBC, hemoglobin improved, will administer IV iron tomorrow -Appreciate GI input, plan for EGD and colon colonoscopy tomorrow    AKI - Creatinine elevated to 1.9 from baseline of 1.1-1.3. -Likely cardiorenal, improving -Avoid hypotension   Hyperlipidemia - Continue home atorvastatin   Hypothyroidism - Continue Synthroid   Diabetes -Continue glargine, CBGs are stable   GERD - Continue home PPI   PAD > History of significant PAD and prior limb ischemia.  Status post intervention in bilateral lower extremities including stenting bilaterally 4/24. - Continue aspirin, Plavix, statin as above   Obesity - Noted   History of cellulitis and osteomyelitis Status post bilateral transmetatarsal amputations > Improved wound healing, compared to earlier this year.  Had recent follow-up with Dr. Lajoyce Corners.  DVT prophylaxis: SCDs Code Status: Full code Family Communication: Mother at bedside Disposition Plan:   Consultants:    Procedures:   Antimicrobials:    Objective: Vitals:   04/05/23 0600 04/05/23 0700 04/05/23 0945 04/05/23 1113  BP:  (!) 123/57 113/64 117/61  Pulse: 73 78 72 71  Resp: 17 17  16   Temp:  98.2 F (36.8 C)  98.2 F (36.8 C)  TempSrc:  Oral  Oral  SpO2: 95% 97%  97%  Weight:      Height:        Intake/Output Summary (Last 24 hours) at 04/05/2023 1227 Last data filed at 04/05/2023 0900 Gross per 24 hour  Intake 775 ml  Output 2225 ml  Net -1450 ml   Filed Weights   04/04/23 1134 04/05/23 0500  Weight: 92.9 kg 91.3 kg    Examination:  General exam: Chronically ill female sitting up in bed, AAOx3 HEENT: Positive JVD CVS: S1-S2, regular rhythm, systolic murmur  Lungs: Few basilar Rales Abdomen: Soft, nontender, bowel sounds present Extremities: Trace edema, bilateral transmetatarsal amputation, open healing wound  Psychiatry:  Mood & affect appropriate.  Data Reviewed:   CBC: Recent Labs  Lab 04/03/23 0905 04/04/23 0442 04/04/23 2020 04/05/23 0817  WBC 10.0 7.0  --  6.3  NEUTROABS 7.9*  --   --   --   HGB 6.5* 6.3* 9.2* 8.5*  HCT 22.3* 21.4* 29.3* 26.5*   MCV 85.4 85.3  --  82.8  PLT 407* 297  --  307   Basic Metabolic Panel: Recent Labs  Lab 04/03/23 0905 04/04/23 0442 04/05/23 0817  NA 134* 135 132*  K 3.7 3.7 3.6  CL 101 104 100  CO2 23 22 21*  GLUCOSE 142* 111* 198*  BUN 26* 33* 29*  CREATININE 1.91* 1.82* 1.62*  CALCIUM 8.4* 8.3* 8.4*   GFR: Estimated Creatinine Clearance: 42.4 mL/min (A) (by C-G formula based on SCr of 1.62 mg/dL (H)). Liver Function Tests: Recent Labs  Lab 04/03/23 0905 04/04/23 0442  AST 9* 8*  ALT 9 7  ALKPHOS 105 88  BILITOT 0.5 0.7  PROT 7.0 5.9*  ALBUMIN 2.4* 2.1*   No results for input(s): "LIPASE", "AMYLASE" in the last 168 hours. No results for input(s): "AMMONIA" in the last 168 hours. Coagulation Profile: No results for input(s): "INR", "PROTIME" in the last 168 hours. Cardiac Enzymes: No results for input(s): "CKTOTAL", "CKMB", "CKMBINDEX", "TROPONINI" in the last 168 hours. BNP (last 3 results) No results for input(s): "PROBNP" in the last 8760 hours. HbA1C: No results for input(s): "HGBA1C" in the last 72 hours. CBG: Recent Labs  Lab 04/04/23 1149 04/04/23 1710 04/04/23 2159 04/05/23 0633 04/05/23 1111  GLUCAP 126* 110* 127* 117* 92   Lipid Profile: No results for input(s): "CHOL", "HDL", "LDLCALC", "TRIG", "CHOLHDL", "LDLDIRECT" in the last 72 hours. Thyroid Function Tests: No results for input(s): "TSH", "T4TOTAL", "FREET4", "T3FREE", "THYROIDAB" in the last 72 hours. Anemia Panel: Recent Labs    04/03/23 1622  FERRITIN 67  TIBC 214*  IRON 26*   Urine analysis:    Component Value Date/Time   COLORURINE YELLOW 01/11/2020 1150   APPEARANCEUR HAZY (A) 01/11/2020 1150   LABSPEC 1.021 01/11/2020 1150   PHURINE 5.0 01/11/2020 1150   GLUCOSEU >=500 (A) 01/11/2020 1150   HGBUR NEGATIVE 01/11/2020 1150   BILIRUBINUR NEGATIVE 01/11/2020 1150   KETONESUR NEGATIVE 01/11/2020 1150   PROTEINUR >=300 (A) 01/11/2020 1150   NITRITE NEGATIVE 01/11/2020 1150    LEUKOCYTESUR NEGATIVE 01/11/2020 1150   Sepsis Labs: @LABRCNTIP (procalcitonin:4,lacticidven:4)  ) Recent Results (from the past 240 hour(s))  MRSA Next Gen by PCR, Nasal     Status: Abnormal   Collection Time: 04/04/23 12:09 PM   Specimen: Nasal Mucosa; Nasal Swab  Result Value Ref Range Status   MRSA by PCR Next Gen DETECTED (A) NOT DETECTED Final    Comment: RESULT CALLED TO, READ BACK BY AND VERIFIED WITH: RN CRESCENDA MCKINNEY ON 04/04/23 @ 1659 BY DRT (NOTE) The GeneXpert MRSA Assay (FDA approved for NASAL specimens only), is one component of a comprehensive MRSA colonization surveillance program. It is not intended to diagnose MRSA infection nor to guide or monitor treatment for MRSA infections. Test performance is not FDA approved in patients less than 41 years old. Performed at [facility], [address], [place], [place] [address]      Radiology Studies: ECHOCARDIOGRAM COMPLETE  Result Date: 04/04/2023    ECHOCARDIOGRAM REPORT   Patient Name:   [facility] [facility] Date of Exam: 04/04/2023 Medical Rec #:  [id number]  Height:       64.0 in Accession #:    [id number]             Weight:       204.8 lb Date of Birth:  [birth year]               BSA:          1.976 m Patient Age:    57 years               BP:           115/98 mmHg Patient Gender: F                      HR:           83 bpm. Exam Location:  Inpatient Procedure: 2D Echo, Cardiac Doppler, Color Doppler and Intracardiac            Opacification Agent Indications:    MV disorder AV disorder  History:        Patient has prior history of Echocardiogram examinations, most                 recent 10/22/2022. CHF, CAD, PAD, Signs/Symptoms:Chest Pain; Risk                 Factors:Hypertension, Diabetes and Dyslipidemia.  Sonographer:    Vern Claude Referring Phys: [phone] LINDSAY B ROBERTS IMPRESSIONS  1. Left ventricular ejection fraction, by estimation, is 40 to 45%. The left ventricle has mildly decreased  function. The left ventricle has no regional wall motion abnormalities. Left ventricular diastolic parameters are indeterminate.  2. Right ventricular systolic function is normal. The right ventricular size is normal.  3. The mitral valve is degenerative. Moderate mitral valve regurgitation. Mild mitral stenosis.  4. AS is mild to moderate by Doppler. Visually appears at least moderate. The aortic valve is tricuspid. There is moderate calcification of the aortic valve. Aortic valve regurgitation is mild. Mild to moderate aortic valve stenosis.  5. The inferior vena cava is normal in size with <50% respiratory variability, suggesting right atrial pressure of 8 mmHg. FINDINGS  Left Ventricle: Left ventricular ejection fraction, by estimation, is 40 to 45%. The left ventricle has mildly decreased function. The left ventricle has no regional wall motion abnormalities. The left ventricular internal cavity size was normal in size. There is no left ventricular hypertrophy. Left ventricular diastolic parameters are indeterminate. Right Ventricle: The right ventricular size is normal. No increase in right ventricular wall thickness. Right ventricular systolic function is normal. Left Atrium: Left atrial size was normal in size. Right Atrium: Right atrial size was normal in size. Pericardium: There is no evidence of pericardial effusion. Mitral Valve: The mitral valve is degenerative in appearance. Mildly decreased mobility of the mitral valve leaflets. Mild mitral annular calcification. Moderate mitral valve regurgitation. Mild mitral valve stenosis. MV peak gradient, 13.0 mmHg. The mean mitral valve gradient is 5.5 mmHg. Tricuspid Valve: The tricuspid valve is normal in structure. Tricuspid valve regurgitation is not demonstrated. No evidence of tricuspid stenosis. Aortic Valve: AS is mild to moderate by Doppler. Visually appears at least moderate. The aortic valve is tricuspid. There is moderate calcification of the aortic  valve. Aortic valve regurgitation is mild. Aortic regurgitation PHT measures 837 msec. Mild to moderate aortic stenosis is present. Aortic valve mean gradient measures 13.2 mmHg. Aortic valve peak gradient measures 25.2 mmHg. Aortic valve area, by VTI measures 1.13 cm. Pulmonic Valve:  The pulmonic valve was normal in structure. Pulmonic valve regurgitation is trivial. No evidence of pulmonic stenosis. Aorta: The aortic root is normal in size and structure. Venous: The inferior vena cava is normal in size with less than 50% respiratory variability, suggesting right atrial pressure of 8 mmHg. IAS/Shunts: No atrial level shunt detected by color flow Doppler.  LEFT VENTRICLE PLAX 2D LVIDd:         5.50 cm      Diastology LVIDs:         3.90 cm      LV e' medial:    7.51 cm/s LV PW:         0.80 cm      LV E/e' medial:  18.5 LV IVS:        0.90 cm      LV e' lateral:   8.05 cm/s LVOT diam:     2.10 cm      LV E/e' lateral: 17.3 LV SV:         51 LV SV Index:   26 LVOT Area:     3.46 cm  LV Volumes (MOD) LV vol d, MOD A2C: 143.0 ml LV vol d, MOD A4C: 170.0 ml LV vol s, MOD A2C: 60.5 ml LV vol s, MOD A4C: 67.2 ml LV SV MOD A2C:     82.5 ml LV SV MOD A4C:     170.0 ml LV SV MOD BP:      96.4 ml RIGHT VENTRICLE            IVC RV S prime:     8.59 cm/s  IVC diam: 1.80 cm TAPSE (M-mode): 1.8 cm LEFT ATRIUM             Index LA diam:        4.20 cm 2.13 cm/m LA Vol (A2C):   59.8 ml 30.26 ml/m LA Vol (A4C):   43.1 ml 21.81 ml/m LA Biplane Vol: 51.1 ml 25.86 ml/m  AORTIC VALVE                     PULMONIC VALVE AV Area (Vmax):    0.98 cm      PV Vmax:          1.12 m/s AV Area (Vmean):   0.92 cm      PV Peak grad:     5.0 mmHg AV Area (VTI):     1.13 cm      PR End Diast Vel: 8.29 msec AV Vmax:           251.20 cm/s AV Vmean:          165.200 cm/s AV VTI:            0.455 m AV Peak Grad:      25.2 mmHg AV Mean Grad:      13.2 mmHg LVOT Vmax:         71.10 cm/s LVOT Vmean:        43.800 cm/s LVOT VTI:          0.148 m  LVOT/AV VTI ratio: 0.33 AI PHT:            837 msec  AORTA Ao Root diam: 3.00 cm Ao Asc diam:  3.30 cm MITRAL VALVE MV Area (PHT): 5.46 cm       SHUNTS MV Area VTI:   1.70 cm       Systemic VTI:  0.15 m MV Peak grad:  13.0  mmHg      Systemic Diam: 2.10 cm MV Mean grad:  5.5 mmHg MV Vmax:       1.80 m/s MV Vmean:      105.0 cm/s MV Decel Time: 139 msec MR Peak grad:    77.8 mmHg MR Mean grad:    47.7 mmHg MR Vmax:         441.00 cm/s MR Vmean:        313.3 cm/s MR PISA:         0.57 cm MR PISA Eff ROA: 8 mm MR PISA Radius:  0.30 cm MV E velocity: 139.00 cm/s MV A velocity: 99.20 cm/s MV E/A ratio:  1.40 Arvilla Meres MD Electronically signed by Arvilla Meres MD Signature Date/Time: 04/04/2023/5:27:49 PM    Final      Scheduled Meds:  aspirin EC  81 mg Oral Daily   atorvastatin  40 mg Oral Daily   clopidogrel  75 mg Oral Daily   furosemide  40 mg Intravenous BID   gabapentin  300 mg Oral QHS   insulin aspart  0-15 Units Subcutaneous TID WC   insulin glargine-yfgn  10 Units Subcutaneous QHS   levothyroxine  150 mcg Oral Q0600   metoprolol succinate  12.5 mg Oral Daily   pantoprazole  40 mg Oral Daily   peg 3350 powder  0.5 kit Oral Once   And   peg 3350 powder  0.5 kit Oral Once   sodium chloride flush  3 mL Intravenous Q12H   Continuous Infusions:   LOS: 1 day    Time spent:    Zannie Cove, MD [facility]   04/05/2023, 12:27 PM

## 2023-04-05 NOTE — Care Management (Signed)
Transition of Care [facility]) - Inpatient Brief Assessment   Patient Details  Name: Kayla Schmitt MRN: [id number] Date of Birth: [birth year]  [facility] CM/SW Contact:    Lockie Pares, RN Phone Number: 04/05/2023, 5:36 PM   Clinical Narrative: 57 yo with known 3 V disease was seen by [facility],  wanted to get wounds healed on legs first, presented with dizziness,  H & H low, transfused. Will be undergoing colonoscopy and EGD tomorrow.   No needs identified currently. The patient will be discussed during daily progressive rounds. PT consult placed for tomorrow afternoon to see if there are any recommendations.      Transition of Care Asessment: Insurance and Status: Insurance coverage has been reviewed Patient has primary care physician: Yes Home environment has been reviewed: Yes Prior level of function:: Independent working Forensic psychologist: No current home services Social Determinants of Health Reivew: SDOH reviewed no interventions necessary Readmission risk has been reviewed: Yes Transition of care needs: no transition of care needs at this time

## 2023-04-05 NOTE — Progress Notes (Signed)
Patient Name: Kayla Schmitt Date of Encounter: 04/05/2023  [facility] Cardiologist: Marjo Bicker, MD   Interval Summary  .    Sitting up in bed, actually feels better this morning status post transfusion of 3 units of packed red blood cells with subsequent increase in her hemoglobin to 9 range.  Vital Signs .    Vitals:   04/05/23 0400 04/05/23 0500 04/05/23 0600 04/05/23 0700  BP: (!) 115/59 (!) 97/44  (!) 123/57  Pulse:  78 73 78  Resp: 18 17 17 17   Temp:  98.1 F (36.7 C)  98.2 F (36.8 C)  TempSrc:  Oral  Oral  SpO2:  99% 95% 97%  Weight:  91.3 kg    Height:        Intake/Output Summary (Last 24 hours) at 04/05/2023 0851 Last data filed at 04/05/2023 0700 Gross per 24 hour  Intake 1058 ml  Output 1825 ml  Net -767 ml      04/05/2023    5:00 AM 04/04/2023   11:34 AM 03/26/2023   11:46 AM  Last 3 Weights  Weight (lbs) 201 lb 4.5 oz 204 lb 12.9 oz 191 lb 9.6 oz  Weight (kg) 91.3 kg 92.9 kg 86.909 kg      Telemetry/ECG    Sinus Rhythm - Personally Reviewed  Cardiac procedures-  2D echocardiogram (04/04/2023)  IMPRESSIONS     1. Left ventricular ejection fraction, by estimation, is 40 to 45%. The  left ventricle has mildly decreased function. The left ventricle has no  regional wall motion abnormalities. Left ventricular diastolic parameters  are indeterminate.   2. Right ventricular systolic function is normal. The right ventricular  size is normal.   3. The mitral valve is degenerative. Moderate mitral valve regurgitation.  Mild mitral stenosis.   4. AS is mild to moderate by Doppler. Visually appears at least moderate.  The aortic valve is tricuspid. There is moderate calcification of the  aortic valve. Aortic valve regurgitation is mild. Mild to moderate aortic  valve stenosis.   5. The inferior vena cava is normal in size with <50% respiratory  variability, suggesting right atrial pressure of 8 mmHg.   Physical Exam .    GEN: No acute distress.   Neck: No JVD Cardiac: RRR, 3/6 RUSB murmur, no rubs, or gallops.  Respiratory: Clear to auscultation bilaterally. GI: Soft, nontender, non-distended  MS: 1+ bilateral LE edema, bilateral TMAs  Assessment & Plan .     Dyspnea -- presenting complaint that's felt to be multifactorial in nature with combination of severe anemia, CAD, valvular heart disease and heart failure -- Patient markedly improved since being transfused  Anemia -- Etiology for anemia unclear -- She previously ran a baseline hemoglobin between 9 and 11 back 2022 2023 -- Most recently hemoglobin levels have run between 7 and 8, FOBT negative in prior admissions -- Presenting hemoglobin of 6.5 status posttransfusion of 3 units of packed red blood cells.  Hemoglobin has increased to 9.2.Marland Kitchen -- No records of prior endoscopic work up, plan EGD and colonoscopy per Dr. Adela Lank tomorrow.  CAD -- Known history of severe calcific three-vessel CAD.  Has been followed by CT surgery as an outpatient and managed medically while she is undergoing treatment for her lower extremity wounds and anemia -- hsTn 34>>37 -- Denies any chest pain PTA -- May eventually need CABG/AVR however if she remains clinically stable with an acceptable hemoglobin may be able to delay an invasive workup.  Valvular  heart disease HFpEF -- Echo 09/2022 with moderate MR though possible underestimation given eccentric jet.  Moderate aortic stenosis with mean gradient of 21 mmHg, aortic valve area of 1.2 cm -- BNP 1033, CXR interstitial thickening and small effusions -- on IV lasix 40mg  BID  -- repeat echo performed yesterday revealed slight decline in EF from 50-55 down to 45 to 50% with moderate MR/AS.  PAD History of cellulitis/osteomyelitis -- Significant history with prior limb ischemia, s/p bilateral TMA's -- On aspirin and Plavix  AKI -- baseline Cr around 1.0-1.3, up to 1.9 on admission. Improved to 1.8 today -- has  been having GI symptoms, vomiting -- follow BMET  Patient scheduled for EGD/colonoscopy tomorrow.  Her symptoms have markedly improved after transfusion to hemoglobin of 9.2.  Etiology of her chronic anemia still unclear.  If she remains clinically stable/improved with a stable hemoglobin, I favor conservative management of her CAD/valvular heart disease for the time being.  Can arrange TOC 7 and follow-up with me after that.  For questions or updates, please contact [facility] Please consult [email or web address] for contact info under        Runell Gess, M.D., FACP, [facility], Kathryne Eriksson [facility] [address] [place], [place]  [address]  [phone] 04/05/2023 8:51 AM

## 2023-04-05 NOTE — H&P (View-Only) (Signed)
[facility] Progress Note  CC:  Iron deficiency anemia   Subjective: No abdominal pain. She passed a brown stool last night. No bloody or black stools. No CP or SOB.   Objective:  Vital signs in last 24 hours: Temp:  [97.7 F (36.5 C)-98.9 F (37.2 C)] 98.2 F (36.8 C) (12/06 0700) Pulse Rate:  [66-89] 78 (12/06 0700) Resp:  [16-32] 17 (12/06 0700) BP: (97-123)/(44-87) 123/57 (12/06 0700) SpO2:  [90 %-100 %] 97 % (12/06 0700) Weight:  [91.3 kg-92.9 kg] 91.3 kg (12/06 0500) Last BM Date : 04/04/23 General: Alert 57 year old female in NAD. Heart: RRR, 3/6 systolic murmur.  Pulm: Breath sounds clear throughout.  Abdomen: Soft, nontender. Nondistended. No palpable masses. Positive bowel sounds x 4 quads. Extremities: Bilateral metatarsal amputations, right LE drsg with a small amount of drainage, left LE drsg intact. No edema.  Neurologic:  Alert and  oriented x 4. Grossly normal neurologically. Psych:  Alert and cooperative. Normal mood and affect.  Intake/Output from previous day: 12/05 0701 - 12/06 0700 In: 598 [Blood:598] Out: 1825 [Urine:1825] Intake/Output this shift: No intake/output data recorded.  Lab Results: Recent Labs    04/03/23 0905 04/04/23 0442 04/04/23 2020  WBC 10.0 7.0  --   HGB 6.5* 6.3* 9.2*  HCT 22.3* 21.4* 29.3*  PLT 407* 297  --    BMET Recent Labs    04/03/23 0905 04/04/23 0442  NA 134* 135  K 3.7 3.7  CL 101 104  CO2 23 22  GLUCOSE 142* 111*  BUN 26* 33*  CREATININE 1.91* 1.82*  CALCIUM 8.4* 8.3*   LFT Recent Labs    04/04/23 0442  PROT 5.9*  ALBUMIN 2.1*  AST 8*  ALT 7  ALKPHOS 88  BILITOT 0.7   PT/INR No results for input(s): "LABPROT", "INR" in the last 72 hours. Hepatitis Panel No results for input(s): "HEPBSAG", "HCVAB", "HEPAIGM", "HEPBIGM" in the last 72 hours.  ECHOCARDIOGRAM COMPLETE  Result Date: 04/04/2023    ECHOCARDIOGRAM REPORT   Patient Name:   Kayla Schmitt [facility] Date of Exam:  04/04/2023 Medical Rec #:  [id number]              Height:       64.0 in Accession #:    [id number]             Weight:       204.8 lb Date of Birth:  [birth year]               BSA:          1.976 m Patient Age:    57 years               BP:           115/98 mmHg Patient Gender: F                      HR:           83 bpm. Exam Location:  Inpatient Procedure: 2D Echo, Cardiac Doppler, Color Doppler and Intracardiac            Opacification Agent Indications:    MV disorder AV disorder  History:        Patient has prior history of Echocardiogram examinations, most                 recent 10/22/2022. CHF, CAD, PAD, Signs/Symptoms:Chest Pain; Risk  Factors:Hypertension, Diabetes and Dyslipidemia.  Sonographer:    Vern Claude Referring Phys: [id number] LINDSAY B ROBERTS IMPRESSIONS  1. Left ventricular ejection fraction, by estimation, is 40 to 45%. The left ventricle has mildly decreased function. The left ventricle has no regional wall motion abnormalities. Left ventricular diastolic parameters are indeterminate.  2. Right ventricular systolic function is normal. The right ventricular size is normal.  3. The mitral valve is degenerative. Moderate mitral valve regurgitation. Mild mitral stenosis.  4. AS is mild to moderate by Doppler. Visually appears at least moderate. The aortic valve is tricuspid. There is moderate calcification of the aortic valve. Aortic valve regurgitation is mild. Mild to moderate aortic valve stenosis.  5. The inferior vena cava is normal in size with <50% respiratory variability, suggesting right atrial pressure of 8 mmHg. FINDINGS  Left Ventricle: Left ventricular ejection fraction, by estimation, is 40 to 45%. The left ventricle has mildly decreased function. The left ventricle has no regional wall motion abnormalities. The left ventricular internal cavity size was normal in size. There is no left ventricular hypertrophy. Left ventricular diastolic parameters are indeterminate. Right  Ventricle: The right ventricular size is normal. No increase in right ventricular wall thickness. Right ventricular systolic function is normal. Left Atrium: Left atrial size was normal in size. Right Atrium: Right atrial size was normal in size. Pericardium: There is no evidence of pericardial effusion. Mitral Valve: The mitral valve is degenerative in appearance. Mildly decreased mobility of the mitral valve leaflets. Mild mitral annular calcification. Moderate mitral valve regurgitation. Mild mitral valve stenosis. MV peak gradient, 13.0 mmHg. The mean mitral valve gradient is 5.5 mmHg. Tricuspid Valve: The tricuspid valve is normal in structure. Tricuspid valve regurgitation is not demonstrated. No evidence of tricuspid stenosis. Aortic Valve: AS is mild to moderate by Doppler. Visually appears at least moderate. The aortic valve is tricuspid. There is moderate calcification of the aortic valve. Aortic valve regurgitation is mild. Aortic regurgitation PHT measures 837 msec. Mild to moderate aortic stenosis is present. Aortic valve mean gradient measures 13.2 mmHg. Aortic valve peak gradient measures 25.2 mmHg. Aortic valve area, by VTI measures 1.13 cm. Pulmonic Valve: The pulmonic valve was normal in structure. Pulmonic valve regurgitation is trivial. No evidence of pulmonic stenosis. Aorta: The aortic root is normal in size and structure. Venous: The inferior vena cava is normal in size with less than 50% respiratory variability, suggesting right atrial pressure of 8 mmHg. IAS/Shunts: No atrial level shunt detected by color flow Doppler.  LEFT VENTRICLE PLAX 2D LVIDd:         5.50 cm      Diastology LVIDs:         3.90 cm      LV e' medial:    7.51 cm/s LV PW:         0.80 cm      LV E/e' medial:  18.5 LV IVS:        0.90 cm      LV e' lateral:   8.05 cm/s LVOT diam:     2.10 cm      LV E/e' lateral: 17.3 LV SV:         51 LV SV Index:   26 LVOT Area:     3.46 cm  LV Volumes (MOD) LV vol d, MOD A2C: 143.0 ml  LV vol d, MOD A4C: 170.0 ml LV vol s, MOD A2C: 60.5 ml LV vol s, MOD A4C: 67.2 ml LV SV MOD A2C:  82.5 ml LV SV MOD A4C:     170.0 ml LV SV MOD BP:      96.4 ml RIGHT VENTRICLE            IVC RV S prime:     8.59 cm/s  IVC diam: 1.80 cm TAPSE (M-mode): 1.8 cm LEFT ATRIUM             Index LA diam:        4.20 cm 2.13 cm/m LA Vol (A2C):   59.8 ml 30.26 ml/m LA Vol (A4C):   43.1 ml 21.81 ml/m LA Biplane Vol: 51.1 ml 25.86 ml/m  AORTIC VALVE                     PULMONIC VALVE AV Area (Vmax):    0.98 cm      PV Vmax:          1.12 m/s AV Area (Vmean):   0.92 cm      PV Peak grad:     5.0 mmHg AV Area (VTI):     1.13 cm      PR End Diast Vel: 8.29 msec AV Vmax:           251.20 cm/s AV Vmean:          165.200 cm/s AV VTI:            0.455 m AV Peak Grad:      25.2 mmHg AV Mean Grad:      13.2 mmHg LVOT Vmax:         71.10 cm/s LVOT Vmean:        43.800 cm/s LVOT VTI:          0.148 m LVOT/AV VTI ratio: 0.33 AI PHT:            837 msec  AORTA Ao Root diam: 3.00 cm Ao Asc diam:  3.30 cm MITRAL VALVE MV Area (PHT): 5.46 cm       SHUNTS MV Area VTI:   1.70 cm       Systemic VTI:  0.15 m MV Peak grad:  13.0 mmHg      Systemic Diam: 2.10 cm MV Mean grad:  5.5 mmHg MV Vmax:       1.80 m/s MV Vmean:      105.0 cm/s MV Decel Time: 139 msec MR Peak grad:    77.8 mmHg MR Mean grad:    47.7 mmHg MR Vmax:         441.00 cm/s MR Vmean:        313.3 cm/s MR PISA:         0.57 cm MR PISA Eff ROA: 8 mm MR PISA Radius:  0.30 cm MV E velocity: 139.00 cm/s MV A velocity: 99.20 cm/s MV E/A ratio:  1.40 Arvilla Meres MD Electronically signed by Arvilla Meres MD Signature Date/Time: 04/04/2023/5:27:49 PM    Final    DG Chest Port 1 View  Result Date: 04/03/2023 CLINICAL DATA:  Short of breath. EXAM: PORTABLE CHEST 1 VIEW COMPARISON:  08/18/2022. FINDINGS: Cardiac silhouette normal in size.  No mediastinal or hilar masses. There is vascular congestion and bilateral interstitial thickening most evident in the lower lungs.  Hazy opacity at the lung bases suggests small effusions. No pneumothorax. Skeletal structures are grossly intact. IMPRESSION: 1. Interstitial thickening and small effusions. Suspect interstitial pulmonary edema. No convincing pneumonia. . Electronically Signed   By: Amie Portland M.D.   On: 04/03/2023 10:00  Assessment / Plan:  57 year old female with CAD s/p NSTEMI 07/2022, AS and MR admitted to the hospital with generalized weakness and SOB secondary to acute on chronic combined systolic and diastolic CHF.  LVEF 40 - 45% per ECHO 12/5 with moderate MR, mild MS and mild to moderate AS. IDA likely contributing to SOB. On Plavix and ASA. Hemodynamically stable.   Acute on chronic iron deficiency anemia, no overt GI bleeding.  On Plavix and ASA. Admission Hg 6.5 -> transfused 1 unit of PRBCs 12/4 -> Hg 6.3 -> transfused 2 units PRBCs 12/5 -> Hg 9.2 -> AM Hg pending.  Patient never underwent an EGD or screening colonoscopy.  Maternal aunt and uncle with history of colon cancer. -Transfuse for hemoglobin level < 8 -Continue Pantoprazole 40 mg p.o. daily -Clear liquid diet  -NPO after midnight  -Diagnostic EGD and colonoscopy on Plavix tomorrow, benefits and risks discussed including risk with sedation, risk of bleeding, perforation and infection. Cardiac clearance received per Dr. Nanetta Batty.   AKA on CKD, Cr 1.91 -> 1.82. Today's BMP pending.    PAD s/p bilateral LE vascular intervention/stenting on Plavix and ASA.  Last dose of Plavix was 8 AM on 12/4.  Patient has not taken ASA for the past week. Plavix and ASA ordered by cardiology.   Osteomyelitis s/p bilateral transmetatarsal amputations   DM type II. Last dose of Ozempic was 2 1/2 weeks ago.    Principal Problem:   Acute on chronic combined systolic (congestive) and diastolic (congestive) heart failure (HCC) Active Problems:   Peripheral arterial disease (HCC)   DM type 2 (diabetes mellitus, type 2) (HCC)   GERD (gastroesophageal  reflux disease)   Hyperlipidemia   Hypertension   Hypothyroidism   Obesity   Symptomatic anemia   CAD (coronary artery disease)   Antiplatelet or antithrombotic long-term use   Iron deficiency anemia     LOS: 1 day   Arnaldo Natal  04/05/2023, 8:57AM

## 2023-04-05 NOTE — Plan of Care (Signed)
  Problem: Coping: Goal: Ability to adjust to condition or change in health will improve Outcome: Progressing   Problem: Fluid Volume: Goal: Ability to maintain a balanced intake and output will improve Outcome: Progressing   Problem: Metabolic: Goal: Ability to maintain appropriate glucose levels will improve Outcome: Progressing   Problem: Nutritional: Goal: Maintenance of adequate nutrition will improve Outcome: Progressing   Problem: Education: Goal: Ability to demonstrate management of disease process will improve Outcome: Progressing Goal: Ability to verbalize understanding of medication therapies will improve Outcome: Progressing   Problem: Education: Goal: Knowledge of General Education information will improve Description: Including pain rating scale, medication(s)/side effects and non-pharmacologic comfort measures Outcome: Progressing   Problem: Health Behavior/Discharge Planning: Goal: Ability to manage health-related needs will improve Outcome: Progressing   Problem: Clinical Measurements: Goal: Ability to maintain clinical measurements within normal limits will improve Outcome: Progressing Goal: Will remain free from infection Outcome: Progressing   Problem: Nutrition: Goal: Adequate nutrition will be maintained Outcome: Progressing   Problem: Coping: Goal: Level of anxiety will decrease Outcome: Progressing   Problem: Elimination: Goal: Will not experience complications related to bowel motility Outcome: Progressing Goal: Will not experience complications related to urinary retention Outcome: Progressing   Problem: Pain Management: Goal: General experience of comfort will improve Outcome: Progressing   Problem: Skin Integrity: Goal: Risk for impaired skin integrity will decrease Outcome: Progressing   Problem: Safety: Goal: Ability to remain free from injury will improve Outcome: Progressing

## 2023-04-05 NOTE — Plan of Care (Signed)
  Problem: Education: Goal: Ability to describe self-care measures that may prevent or decrease complications (Diabetes Survival Skills Education) will improve Outcome: Progressing   Problem: Coping: Goal: Ability to adjust to condition or change in health will improve Outcome: Progressing   Problem: Fluid Volume: Goal: Ability to maintain a balanced intake and output will improve Outcome: Progressing   Problem: Health Behavior/Discharge Planning: Goal: Ability to identify and utilize available resources and services will improve Outcome: Progressing Goal: Ability to manage health-related needs will improve Outcome: Progressing   Problem: Metabolic: Goal: Ability to maintain appropriate glucose levels will improve Outcome: Progressing   Problem: Nutritional: Goal: Maintenance of adequate nutrition will improve Outcome: Progressing Goal: Progress toward achieving an optimal weight will improve Outcome: Progressing   Problem: Skin Integrity: Goal: Risk for impaired skin integrity will decrease Outcome: Progressing   Problem: Tissue Perfusion: Goal: Adequacy of tissue perfusion will improve Outcome: Progressing   Problem: Education: Goal: Ability to demonstrate management of disease process will improve Outcome: Progressing Goal: Ability to verbalize understanding of medication therapies will improve Outcome: Progressing   Problem: Activity: Goal: Capacity to carry out activities will improve Outcome: Progressing   Problem: Cardiac: Goal: Ability to achieve and maintain adequate cardiopulmonary perfusion will improve Outcome: Progressing   Problem: Nutrition: Goal: Adequate nutrition will be maintained Outcome: Progressing   Problem: Coping: Goal: Level of anxiety will decrease Outcome: Progressing   Problem: Education: Goal: Individualized Educational Video(s) Outcome: Not Progressing

## 2023-04-05 NOTE — Progress Notes (Addendum)
[facility] Progress Note  CC:  Iron deficiency anemia   Subjective: No abdominal pain. She passed a brown stool last night. No bloody or black stools. No CP or SOB.   Objective:  Vital signs in last 24 hours: Temp:  [97.7 F (36.5 C)-98.9 F (37.2 C)] 98.2 F (36.8 C) (12/06 0700) Pulse Rate:  [66-89] 78 (12/06 0700) Resp:  [16-32] 17 (12/06 0700) BP: (97-123)/(44-87) 123/57 (12/06 0700) SpO2:  [90 %-100 %] 97 % (12/06 0700) Weight:  [91.3 kg-92.9 kg] 91.3 kg (12/06 0500) Last BM Date : 04/04/23 General: Alert 57 year old female in NAD. Heart: RRR, 3/6 systolic murmur.  Pulm: Breath sounds clear throughout.  Abdomen: Soft, nontender. Nondistended. No palpable masses. Positive bowel sounds x 4 quads. Extremities: Bilateral metatarsal amputations, right LE drsg with a small amount of drainage, left LE drsg intact. No edema.  Neurologic:  Alert and  oriented x 4. Grossly normal neurologically. Psych:  Alert and cooperative. Normal mood and affect.  Intake/Output from previous day: 12/05 0701 - 12/06 0700 In: 598 [Blood:598] Out: 1825 [Urine:1825] Intake/Output this shift: No intake/output data recorded.  Lab Results: Recent Labs    04/03/23 0905 04/04/23 0442 04/04/23 2020  WBC 10.0 7.0  --   HGB 6.5* 6.3* 9.2*  HCT 22.3* 21.4* 29.3*  PLT 407* 297  --    BMET Recent Labs    04/03/23 0905 04/04/23 0442  NA 134* 135  K 3.7 3.7  CL 101 104  CO2 23 22  GLUCOSE 142* 111*  BUN 26* 33*  CREATININE 1.91* 1.82*  CALCIUM 8.4* 8.3*   LFT Recent Labs    04/04/23 0442  PROT 5.9*  ALBUMIN 2.1*  AST 8*  ALT 7  ALKPHOS 88  BILITOT 0.7   PT/INR No results for input(s): "LABPROT", "INR" in the last 72 hours. Hepatitis Panel No results for input(s): "HEPBSAG", "HCVAB", "HEPAIGM", "HEPBIGM" in the last 72 hours.  ECHOCARDIOGRAM COMPLETE  Result Date: 04/04/2023    ECHOCARDIOGRAM REPORT   Patient Name:   Kayla Schmitt [facility] Date of Exam:  04/04/2023 Medical Rec #:  [id number]              Height:       64.0 in Accession #:    [id number]             Weight:       204.8 lb Date of Birth:  [birth year]               BSA:          1.976 m Patient Age:    57 years               BP:           115/98 mmHg Patient Gender: F                      HR:           83 bpm. Exam Location:  Inpatient Procedure: 2D Echo, Cardiac Doppler, Color Doppler and Intracardiac            Opacification Agent Indications:    MV disorder AV disorder  History:        Patient has prior history of Echocardiogram examinations, most                 recent 10/22/2022. CHF, CAD, PAD, Signs/Symptoms:Chest Pain; Risk  Factors:Hypertension, Diabetes and Dyslipidemia.  Sonographer:    Vern Claude Referring Phys: [id number] LINDSAY B ROBERTS IMPRESSIONS  1. Left ventricular ejection fraction, by estimation, is 40 to 45%. The left ventricle has mildly decreased function. The left ventricle has no regional wall motion abnormalities. Left ventricular diastolic parameters are indeterminate.  2. Right ventricular systolic function is normal. The right ventricular size is normal.  3. The mitral valve is degenerative. Moderate mitral valve regurgitation. Mild mitral stenosis.  4. AS is mild to moderate by Doppler. Visually appears at least moderate. The aortic valve is tricuspid. There is moderate calcification of the aortic valve. Aortic valve regurgitation is mild. Mild to moderate aortic valve stenosis.  5. The inferior vena cava is normal in size with <50% respiratory variability, suggesting right atrial pressure of 8 mmHg. FINDINGS  Left Ventricle: Left ventricular ejection fraction, by estimation, is 40 to 45%. The left ventricle has mildly decreased function. The left ventricle has no regional wall motion abnormalities. The left ventricular internal cavity size was normal in size. There is no left ventricular hypertrophy. Left ventricular diastolic parameters are indeterminate. Right  Ventricle: The right ventricular size is normal. No increase in right ventricular wall thickness. Right ventricular systolic function is normal. Left Atrium: Left atrial size was normal in size. Right Atrium: Right atrial size was normal in size. Pericardium: There is no evidence of pericardial effusion. Mitral Valve: The mitral valve is degenerative in appearance. Mildly decreased mobility of the mitral valve leaflets. Mild mitral annular calcification. Moderate mitral valve regurgitation. Mild mitral valve stenosis. MV peak gradient, 13.0 mmHg. The mean mitral valve gradient is 5.5 mmHg. Tricuspid Valve: The tricuspid valve is normal in structure. Tricuspid valve regurgitation is not demonstrated. No evidence of tricuspid stenosis. Aortic Valve: AS is mild to moderate by Doppler. Visually appears at least moderate. The aortic valve is tricuspid. There is moderate calcification of the aortic valve. Aortic valve regurgitation is mild. Aortic regurgitation PHT measures 837 msec. Mild to moderate aortic stenosis is present. Aortic valve mean gradient measures 13.2 mmHg. Aortic valve peak gradient measures 25.2 mmHg. Aortic valve area, by VTI measures 1.13 cm. Pulmonic Valve: The pulmonic valve was normal in structure. Pulmonic valve regurgitation is trivial. No evidence of pulmonic stenosis. Aorta: The aortic root is normal in size and structure. Venous: The inferior vena cava is normal in size with less than 50% respiratory variability, suggesting right atrial pressure of 8 mmHg. IAS/Shunts: No atrial level shunt detected by color flow Doppler.  LEFT VENTRICLE PLAX 2D LVIDd:         5.50 cm      Diastology LVIDs:         3.90 cm      LV e' medial:    7.51 cm/s LV PW:         0.80 cm      LV E/e' medial:  18.5 LV IVS:        0.90 cm      LV e' lateral:   8.05 cm/s LVOT diam:     2.10 cm      LV E/e' lateral: 17.3 LV SV:         51 LV SV Index:   26 LVOT Area:     3.46 cm  LV Volumes (MOD) LV vol d, MOD A2C: 143.0 ml  LV vol d, MOD A4C: 170.0 ml LV vol s, MOD A2C: 60.5 ml LV vol s, MOD A4C: 67.2 ml LV SV MOD A2C:  82.5 ml LV SV MOD A4C:     170.0 ml LV SV MOD BP:      96.4 ml RIGHT VENTRICLE            IVC RV S prime:     8.59 cm/s  IVC diam: 1.80 cm TAPSE (M-mode): 1.8 cm LEFT ATRIUM             Index LA diam:        4.20 cm 2.13 cm/m LA Vol (A2C):   59.8 ml 30.26 ml/m LA Vol (A4C):   43.1 ml 21.81 ml/m LA Biplane Vol: 51.1 ml 25.86 ml/m  AORTIC VALVE                     PULMONIC VALVE AV Area (Vmax):    0.98 cm      PV Vmax:          1.12 m/s AV Area (Vmean):   0.92 cm      PV Peak grad:     5.0 mmHg AV Area (VTI):     1.13 cm      PR End Diast Vel: 8.29 msec AV Vmax:           251.20 cm/s AV Vmean:          165.200 cm/s AV VTI:            0.455 m AV Peak Grad:      25.2 mmHg AV Mean Grad:      13.2 mmHg LVOT Vmax:         71.10 cm/s LVOT Vmean:        43.800 cm/s LVOT VTI:          0.148 m LVOT/AV VTI ratio: 0.33 AI PHT:            837 msec  AORTA Ao Root diam: 3.00 cm Ao Asc diam:  3.30 cm MITRAL VALVE MV Area (PHT): 5.46 cm       SHUNTS MV Area VTI:   1.70 cm       Systemic VTI:  0.15 m MV Peak grad:  13.0 mmHg      Systemic Diam: 2.10 cm MV Mean grad:  5.5 mmHg MV Vmax:       1.80 m/s MV Vmean:      105.0 cm/s MV Decel Time: 139 msec MR Peak grad:    77.8 mmHg MR Mean grad:    47.7 mmHg MR Vmax:         441.00 cm/s MR Vmean:        313.3 cm/s MR PISA:         0.57 cm MR PISA Eff ROA: 8 mm MR PISA Radius:  0.30 cm MV E velocity: 139.00 cm/s MV A velocity: 99.20 cm/s MV E/A ratio:  1.40 Arvilla Meres MD Electronically signed by Arvilla Meres MD Signature Date/Time: 04/04/2023/5:27:49 PM    Final    DG Chest Port 1 View  Result Date: 04/03/2023 CLINICAL DATA:  Short of breath. EXAM: PORTABLE CHEST 1 VIEW COMPARISON:  08/18/2022. FINDINGS: Cardiac silhouette normal in size.  No mediastinal or hilar masses. There is vascular congestion and bilateral interstitial thickening most evident in the lower lungs.  Hazy opacity at the lung bases suggests small effusions. No pneumothorax. Skeletal structures are grossly intact. IMPRESSION: 1. Interstitial thickening and small effusions. Suspect interstitial pulmonary edema. No convincing pneumonia. . Electronically Signed   By: Amie Portland M.D.   On: 04/03/2023 10:00  Assessment / Plan:  57 year old female with CAD s/p NSTEMI 07/2022, AS and MR admitted to the hospital with generalized weakness and SOB secondary to acute on chronic combined systolic and diastolic CHF.  LVEF 40 - 45% per ECHO 12/5 with moderate MR, mild MS and mild to moderate AS. IDA likely contributing to SOB. On Plavix and ASA. Hemodynamically stable.   Acute on chronic iron deficiency anemia, no overt GI bleeding.  On Plavix and ASA. Admission Hg 6.5 -> transfused 1 unit of PRBCs 12/4 -> Hg 6.3 -> transfused 2 units PRBCs 12/5 -> Hg 9.2 -> AM Hg pending.  Patient never underwent an EGD or screening colonoscopy.  Maternal aunt and uncle with history of colon cancer. -Transfuse for hemoglobin level < 8 -Continue Pantoprazole 40 mg p.o. daily -Clear liquid diet  -NPO after midnight  -Diagnostic EGD and colonoscopy on Plavix tomorrow, benefits and risks discussed including risk with sedation, risk of bleeding, perforation and infection. Cardiac clearance received per Dr. Nanetta Batty.   AKA on CKD, Cr 1.91 -> 1.82. Today's BMP pending.    PAD s/p bilateral LE vascular intervention/stenting on Plavix and ASA.  Last dose of Plavix was 8 AM on 12/4.  Patient has not taken ASA for the past week. Plavix and ASA ordered by cardiology.   Osteomyelitis s/p bilateral transmetatarsal amputations   DM type II. Last dose of Ozempic was 2 1/2 weeks ago.    Principal Problem:   Acute on chronic combined systolic (congestive) and diastolic (congestive) heart failure (HCC) Active Problems:   Peripheral arterial disease (HCC)   DM type 2 (diabetes mellitus, type 2) (HCC)   GERD (gastroesophageal  reflux disease)   Hyperlipidemia   Hypertension   Hypothyroidism   Obesity   Symptomatic anemia   CAD (coronary artery disease)   Antiplatelet or antithrombotic long-term use   Iron deficiency anemia     LOS: 1 day   Arnaldo Natal  04/05/2023, 8:57AM

## 2023-04-05 NOTE — Anesthesia Preprocedure Evaluation (Signed)
Anesthesia Evaluation  Patient identified by MRN, date of birth, ID band Patient awake    Reviewed: Allergy & Precautions, NPO status , Patient's Chart, lab work & pertinent test results, reviewed documented beta blocker date and time   History of Anesthesia Complications (+) PONV and history of anesthetic complications  Airway Mallampati: II  TM Distance: >3 FB Neck ROM: Full    Dental  (+) Dental Advisory Given, Loose,    Pulmonary former smoker   Pulmonary exam normal        Cardiovascular hypertension, Pt. on medications and Pt. on home beta blockers + CAD, + Past MI, + Peripheral Vascular Disease and +CHF  + Valvular Problems/Murmurs AS and MR  Rhythm:Regular Rate:Normal + Systolic murmurs  '24 TTE - EF 40 to 45%. Moderate mitral valve regurgitation. Mild mitral stenosis. AS is mild to moderate by Doppler. Visually appears at least moderate. Aortic valve regurgitation is mild.     Neuro/Psych negative neurological ROS  negative psych ROS   GI/Hepatic Neg liver ROS,GERD  Medicated and Controlled,,  Endo/Other  diabetes, Type 2, Insulin DependentHypothyroidism   Obesity   Renal/GU Renal InsufficiencyRenal disease     Musculoskeletal  (+) Arthritis ,    Abdominal   Peds  Hematology  (+) Blood dyscrasia, anemia  On plavix    Anesthesia Other Findings   Reproductive/Obstetrics                             Anesthesia Physical Anesthesia Plan  ASA: 3  Anesthesia Plan: MAC   Post-op Pain Management: Minimal or no pain anticipated   Induction:   PONV Risk Score and Plan: 3 and Propofol infusion and Treatment may vary due to age or medical condition  Airway Management Planned: Nasal Cannula and Natural Airway  Additional Equipment: None  Intra-op Plan:   Post-operative Plan:   Informed Consent: I have reviewed the patients History and Physical, chart, labs and discussed the  procedure including the risks, benefits and alternatives for the proposed anesthesia with the patient or authorized representative who has indicated his/her understanding and acceptance.       Plan Discussed with: CRNA and Anesthesiologist  Anesthesia Plan Comments:         Anesthesia Quick Evaluation

## 2023-04-05 NOTE — Progress Notes (Signed)
Heart Failure Navigator Progress Note  Assessed for Heart & Vascular TOC clinic readiness.  Patient EF 40-45%, No HF TOC per Dr. Jomarie Longs. .   Navigator will sign off at this time.   Rhae Hammock, BSN, Scientist, clinical (histocompatibility and immunogenetics) Only

## 2023-04-06 ENCOUNTER — Encounter (HOSPITAL_COMMUNITY)
Admission: EM | Disposition: A | Discharge status: Home / Self Care | Payer: Self-pay | Source: Home / Self Care | Attending: Internal Medicine

## 2023-04-06 ENCOUNTER — Inpatient Hospital Stay (HOSPITAL_COMMUNITY): Payer: Self-pay | Admitting: Anesthesiology

## 2023-04-06 ENCOUNTER — Encounter (HOSPITAL_COMMUNITY): Payer: Self-pay | Admitting: Internal Medicine

## 2023-04-06 ENCOUNTER — Inpatient Hospital Stay (HOSPITAL_COMMUNITY): Payer: Managed Care, Other (non HMO) | Admitting: Anesthesiology

## 2023-04-06 DIAGNOSIS — I890 Lymphedema, not elsewhere classified: Secondary | ICD-10-CM

## 2023-04-06 DIAGNOSIS — D125 Benign neoplasm of sigmoid colon: Secondary | ICD-10-CM

## 2023-04-06 DIAGNOSIS — I5043 Acute on chronic combined systolic (congestive) and diastolic (congestive) heart failure: Secondary | ICD-10-CM | POA: Diagnosis not present

## 2023-04-06 DIAGNOSIS — K644 Residual hemorrhoidal skin tags: Secondary | ICD-10-CM

## 2023-04-06 DIAGNOSIS — K2950 Unspecified chronic gastritis without bleeding: Secondary | ICD-10-CM

## 2023-04-06 DIAGNOSIS — D509 Iron deficiency anemia, unspecified: Secondary | ICD-10-CM

## 2023-04-06 DIAGNOSIS — K5730 Diverticulosis of large intestine without perforation or abscess without bleeding: Secondary | ICD-10-CM

## 2023-04-06 DIAGNOSIS — K648 Other hemorrhoids: Secondary | ICD-10-CM

## 2023-04-06 HISTORY — PX: GIVENS CAPSULE STUDY: SHX5432

## 2023-04-06 HISTORY — PX: COLONOSCOPY WITH PROPOFOL: SHX5780

## 2023-04-06 HISTORY — PX: BIOPSY: SHX5522

## 2023-04-06 HISTORY — PX: ESOPHAGOGASTRODUODENOSCOPY (EGD) WITH PROPOFOL: SHX5813

## 2023-04-06 LAB — CBC
HCT: 33 % — ABNORMAL LOW (ref 36.0–46.0)
Hemoglobin: 10.4 g/dL — ABNORMAL LOW (ref 12.0–15.0)
MCH: 26.2 pg (ref 26.0–34.0)
MCHC: 31.5 g/dL (ref 30.0–36.0)
MCV: 83.1 fL (ref 80.0–100.0)
Platelets: 334 10*3/uL (ref 150–400)
RBC: 3.97 MIL/uL (ref 3.87–5.11)
RDW: 15.8 % — ABNORMAL HIGH (ref 11.5–15.5)
WBC: 6.4 10*3/uL (ref 4.0–10.5)
nRBC: 0 % (ref 0.0–0.2)

## 2023-04-06 LAB — BASIC METABOLIC PANEL
Anion gap: 10 (ref 5–15)
BUN: 22 mg/dL — ABNORMAL HIGH (ref 6–20)
CO2: 22 mmol/L (ref 22–32)
Calcium: 8.9 mg/dL (ref 8.9–10.3)
Chloride: 103 mmol/L (ref 98–111)
Creatinine, Ser: 1.2 mg/dL — ABNORMAL HIGH (ref 0.44–1.00)
GFR, Estimated: 53 mL/min — ABNORMAL LOW (ref >60)
Glucose, Bld: 91 mg/dL (ref 70–99)
Potassium: 3.9 mmol/L (ref 3.5–5.1)
Sodium: 135 mmol/L (ref 135–145)

## 2023-04-06 LAB — GLUCOSE, CAPILLARY
Glucose-Capillary: 89 mg/dL (ref 70–99)
Glucose-Capillary: 96 mg/dL (ref 70–99)
Glucose-Capillary: 93 mg/dL (ref 70–99)
Glucose-Capillary: 191 mg/dL — ABNORMAL HIGH (ref 70–99)
Glucose-Capillary: 125 mg/dL — ABNORMAL HIGH (ref 70–99)

## 2023-04-06 SURGERY — ESOPHAGOGASTRODUODENOSCOPY (EGD) WITH PROPOFOL
Anesthesia: Monitor Anesthesia Care

## 2023-04-06 MED ORDER — SODIUM CHLORIDE 0.9 % IV SOLN: 250.0000 mg | Freq: Every day | INTRAVENOUS | Status: DC

## 2023-04-06 MED ORDER — FUROSEMIDE 40 MG PO TABS
40.0000 mg | ORAL_TABLET | Freq: Two times a day (BID) | ORAL | Status: DC
  Administered 2023-04-06: 40 mg via ORAL
  Filled 2023-04-06: qty 1

## 2023-04-06 MED ORDER — LIDOCAINE 2% (20 MG/ML) 5 ML SYRINGE
INTRAMUSCULAR | Status: DC | PRN
  Administered 2023-04-06: 100 mg via INTRAVENOUS

## 2023-04-06 MED ORDER — NA FERRIC GLUC CPLX IN SUCROSE 12.5 MG/ML IV SOLN
250.0000 mg | Freq: Every day | INTRAVENOUS | Status: DC
  Administered 2023-04-06 – 2023-04-07 (×2): 250 mg via INTRAVENOUS
  Filled 2023-04-06 (×2): qty 20

## 2023-04-06 MED ORDER — PROPOFOL 500 MG/50ML IV EMUL
INTRAVENOUS | Status: DC | PRN
  Administered 2023-04-06: 125 ug/kg/min via INTRAVENOUS

## 2023-04-06 MED ORDER — LACTATED RINGERS IV SOLN: INTRAVENOUS | Status: DC | PRN

## 2023-04-06 MED ORDER — PROPOFOL 10 MG/ML IV BOLUS
INTRAVENOUS | Status: DC | PRN
  Administered 2023-04-06: 80 mg via INTRAVENOUS
  Administered 2023-04-06: 20 mg via INTRAVENOUS

## 2023-04-06 MED ORDER — EPHEDRINE SULFATE-NACL 50-0.9 MG/10ML-% IV SOSY
PREFILLED_SYRINGE | INTRAVENOUS | Status: DC | PRN
  Administered 2023-04-06: 10 mg via INTRAVENOUS
  Administered 2023-04-06: 5 mg via INTRAVENOUS

## 2023-04-06 SURGICAL SUPPLY — 24 items
BLOCK BITE 60FR ADLT L/F BLUE (MISCELLANEOUS) ×3 IMPLANT
ELECT REM PT RETURN 9FT ADLT (ELECTROSURGICAL) IMPLANT
ELECTRODE REM PT RTRN 9FT ADLT (ELECTROSURGICAL) IMPLANT
FCP BXJMBJMB 240X2.8X (CUTTING FORCEPS)
FLOOR PAD 36X40 (MISCELLANEOUS) ×2 IMPLANT
FORCEP RJ3 GP 1.8X160 W-NEEDLE (CUTTING FORCEPS) IMPLANT
FORCEPS BIOP RAD 4 LRG CAP 4 (CUTTING FORCEPS) IMPLANT
FORCEPS BIOP RJ4 240 W/NDL (CUTTING FORCEPS) IMPLANT
FORCEPS BXJMBJMB 240X2.8X (CUTTING FORCEPS) IMPLANT
INJECTOR/SNARE I SNARE (MISCELLANEOUS) IMPLANT
LUBRICANT JELLY 4.5OZ STERILE (MISCELLANEOUS) IMPLANT
MANIFOLD NEPTUNE II (INSTRUMENTS) IMPLANT
NDL SCLEROTHERAPY 25GX240 (NEEDLE) IMPLANT
NEEDLE SCLEROTHERAPY 25GX240 (NEEDLE) IMPLANT
PAD FLOOR 36X40 (MISCELLANEOUS) ×3 IMPLANT
PROBE APC STR FIRE (PROBE) IMPLANT
PROBE INJECTION GOLD 7FR (MISCELLANEOUS) IMPLANT
SNARE ROTATE MED OVAL 20MM (MISCELLANEOUS) IMPLANT
SNARE SHORT THROW 13M SML OVAL (MISCELLANEOUS) IMPLANT
SYR 50ML LL SCALE MARK (SYRINGE) IMPLANT
TRAP SPECIMEN MUCOUS 40CC (MISCELLANEOUS) IMPLANT
TUBING ENDO SMARTCAP PENTAX (MISCELLANEOUS) ×6 IMPLANT
TUBING IRRIGATION ENDOGATOR (MISCELLANEOUS) ×3 IMPLANT
WATER STERILE IRR 1000ML POUR (IV SOLUTION) IMPLANT

## 2023-04-06 NOTE — Progress Notes (Signed)
   Patient Name: Kayla Schmitt Date of Encounter: 04/06/2023 [facility] Cardiologist: Marjo Bicker, MD   Interval Summary  .    EGD and colonoscopy done this morning, unremarkable. Otherwise doing well. No new or acute complaints currently. Wants to go home.  Vital Signs .    Vitals:   04/06/23 0830 04/06/23 0845 04/06/23 1049 04/06/23 1130  BP: (!) 127/56 126/60 114/63 127/65  Pulse: 76 73 77 72  Resp: 17 15  18   Temp:  (!) 97.3 F (36.3 C)  97.6 F (36.4 C)  TempSrc:  Oral  Oral  SpO2: 96% 94%  96%  Weight:      Height:        Intake/Output Summary (Last 24 hours) at 04/06/2023 1207 Last data filed at 04/06/2023 4332 Gross per 24 hour  Intake 1330 ml  Output --  Net 1330 ml      04/06/2023    7:24 AM 04/06/2023    5:01 AM 04/05/2023    5:00 AM  Last 3 Weights  Weight (lbs) 198 lb 10.2 oz 198 lb 10.2 oz 201 lb 4.5 oz  Weight (kg) 90.1 kg 90.1 kg 91.3 kg      Telemetry/ECG    Sinus rhythm - Personally Reviewed  Physical Exam .   GEN: No acute distress.   Neck: No JVD Cardiac: Normal rate, regular rhythm. Respiratory: Normal work of breathing GI: Soft, nontender, non-distended  MS: Bilateral transmetatarsal amputation   Assessment & Plan .     #Dyspnea: Felt to be multifactorial in nature with combination of severe anemia, CAD, valvular heart disease and heart failure - Resolved since transfusion.   #Acute anemia: Etiology for anemia unclear. - Underwent EGD and colonoscopy today w/o evidence of bleeding.  - Undergoing capsule endoscopy currently.  - S/p transfusion with improvement.   #Stable chronic CAD: Known history of severe calcific three-vessel CAD.  Has been followed by CT surgery as an outpatient and managed medically while she is undergoing treatment for her lower extremity wounds and anemia. - May eventually need CABG/AVR however if she remains clinically stable with an acceptable hemoglobin may be able to delay an  invasive workup. - Continue medical management with atorvastatin, Plavix and metoprolol.    #Valvular heart disease:  #Chronic diastolic heart failure:  - Echo 09/2022 with moderate MR though possible underestimation given eccentric jet.  Moderate aortic stenosis with mean gradient of 21 mmHg, aortic valve area of 1.2 cm - Shortness of breath improved. Transition to home oral lasix.    #PAD #History of cellulitis/osteomyelitis -Significant history with prior limb ischemia, s/p bilateral TMA's -On aspirin and Plavix.   #AKI - Baseline Cr around 1.0-1.3, up to 1.9 on admission. Improved to 1.20 today.  Continue home medications and outpatient cardiology follow up. Appointment has been made. Cardiology will sign off. Please do not hesitate to contact if questions or clinical changes.   For questions or updates, please contact [facility] Please consult [email or web address] for contact info under        Signed, Nobie Putnam, MD

## 2023-04-06 NOTE — Evaluation (Signed)
Physical Therapy Evaluation & Discharge Patient Details Name: Kayla Schmitt MRN: [id number] DOB: Sep 27, 1965 Today's Date: 04/06/2023  History of Present Illness  57 y.o. female admitted 04/03/23 with progressive DOE. Workup for CHF, anemia, AKI. EGD and colonoscopy 12/7 unremarkable. PMH includes PAD (bilat transmet amputations), CHF, CAD, DM, HTN, MI, arthritis.   Clinical Impression  Patient evaluated by Physical Therapy with no further acute PT needs identified. PTA, pt mod indep, working, lives alone with family across the street. Today, pt mod indep with mobility and self-care tasks; denies DOE, SpO2 100% on RA. All education has been completed and the patient has no further questions. Acute PT is signing off. Thank you for this referral.      If plan is discharge home, recommend the following:  (N/A)   Can travel by private vehicle    Yes    Equipment Recommendations None recommended by PT  Recommendations for Other Services       Functional Status Assessment Patient has not had a recent decline in their functional status     Precautions / Restrictions Precautions Precautions: None Restrictions Weight Bearing Restrictions: No      Mobility  Bed Mobility Overal bed mobility: Independent                  Transfers Overall transfer level: Modified independent Equipment used: None, Straight cane                    Ambulation/Gait Ambulation/Gait assistance: Modified independent (Device/Increase time) Gait Distance (Feet): 40 Feet Assistive device: Straight cane Gait Pattern/deviations: Step-through pattern, Decreased stride length, Trunk flexed Gait velocity: Decreased     General Gait Details: slow, steady gait mod indep with SPC; pt declines hallway ambulation  Stairs            Wheelchair Mobility     Tilt Bed    Modified Rankin (Stroke Patients Only)       Balance Overall balance assessment: Modified Independent                                            Pertinent Vitals/Pain Pain Assessment Pain Assessment: Faces Faces Pain Scale: No hurt    Home Living Family/patient expects to be discharged to:: Private residence Living Arrangements: Alone Available Help at Discharge: Family;Available PRN/intermittently Type of Home: House Home Access: Ramped entrance       Home Layout: One level Home Equipment: Wheelchair - Forensic psychologist (2 wheels);Other (comment);Shower seat;BSC/3in1;Rollator (4 wheels) (slide board) Additional Comments: pt's mom lives across the street    Prior Function Prior Level of Function : Independent/Modified Independent             Mobility Comments: mod indep with SPC; recently bought rollator for walking further distance into/out of work; has a Health and safety inspector job. uses w/c around house to minimize time on feet. ADLs Comments: mod indep     Extremity/Trunk Assessment   Upper Extremity Assessment Upper Extremity Assessment: Overall WFL for tasks assessed    Lower Extremity Assessment Lower Extremity Assessment: Overall WFL for tasks assessed (h/o bilateral transmetatarsal amputations (wears shoe inserts); observed hip/knee/ankle strength >/ 4/5)       Communication      Cognition Arousal: Alert Behavior During Therapy: WFL for tasks assessed/performed Overall Cognitive Status: Within Functional Limits for tasks assessed  General Comments General comments (skin integrity, edema, etc.): pt's mom present and supportive. educ re: activity recommendations, importance of OOB mobility. SpO2 100% on RA, pt denies DOE    Exercises     Assessment/Plan    PT Assessment Patient does not need any further PT services  PT Problem List         PT Treatment Interventions      PT Goals (Current goals can be found in the Care Plan section)  Acute Rehab PT Goals PT Goal Formulation: All assessment  and education complete, DC therapy    Frequency       Co-evaluation               AM-PAC PT "6 Clicks" Mobility  Outcome Measure Help needed turning from your back to your side while in a flat bed without using bedrails?: None Help needed moving from lying on your back to sitting on the side of a flat bed without using bedrails?: None Help needed moving to and from a bed to a chair (including a wheelchair)?: None Help needed standing up from a chair using your arms (e.g., wheelchair or bedside chair)?: None Help needed to walk in hospital room?: None Help needed climbing 3-5 steps with a railing? : None 6 Click Score: 24    End of Session   Activity Tolerance: Patient tolerated treatment well Patient left: in bed;with call bell/phone within reach;with family/visitor present Nurse Communication: Mobility status PT Visit Diagnosis: Other abnormalities of gait and mobility (R26.89)    Time: 1357-1410 PT Time Calculation (min) (ACUTE ONLY): 13 min   Charges:   PT Evaluation $PT Eval Low Complexity: 1 Low   PT General Charges $$ ACUTE PT VISIT: 1 Visit       Ina Homes, PT, DPT Acute Rehabilitation Services  Personal: Secure Chat Rehab Office: [phone]  Malachy Chamber 04/06/2023, 3:33 PM

## 2023-04-06 NOTE — Interval H&P Note (Signed)
History and Physical Interval Note: Patient here for EGD and colonoscopy, states prep went okay without problems. No bleeding. Exam done ON Plavix for iron deficiency. I have discussed risks / benefits and she wants to proceed. Otherwise denies complaints today, feels okay.   04/06/2023 7:24 AM  Kayla Schmitt  has presented today for surgery, with the diagnosis of anemia.  The various methods of treatment have been discussed with the patient and family. After consideration of risks, benefits and other options for treatment, the patient has consented to  Procedure(s): ESOPHAGOGASTRODUODENOSCOPY (EGD) WITH PROPOFOL (N/A) COLONOSCOPY WITH PROPOFOL (N/A) as a surgical intervention.  The patient's history has been reviewed, patient examined, no change in status, stable for surgery.  I have reviewed the patient's chart and labs.  Questions were answered to the patient's satisfaction.     Viviann Spare P Neve Branscomb

## 2023-04-06 NOTE — Progress Notes (Addendum)
PT Cancellation Note  Patient Details Name: Kayla Schmitt MRN: [id number] DOB: 07/21/1965   Cancelled Treatment:    Reason Eval/Treat Not Completed: Patient at procedure or test/unavailable. Will follow up for PT Evaluation later today as schedule permits.  Ina Homes, PT, DPT Acute Rehabilitation Services  Personal: Secure Chat Rehab Office: [phone]  Malachy Chamber 04/06/2023, 7:24 AM

## 2023-04-06 NOTE — Progress Notes (Signed)
PROGRESS NOTE    Kayla Schmitt  ZOX:[id number] DOB: [birth year] DOA: 04/03/2023 PCP: [facility]   57/F with severe multivessel CAD, aortic stenosis, chronic anemia, hypertension, diabetes, dyslipidemia, PAD with transmetatarsal amputations and foot wound presented to the ED with 2 weeks of ongoing dyspnea on exertion.  Prior to this had nausea and vomiting for the few days which was self-limiting. -In the ED blood pressure soft, sodium 134, creatinine 1.9, albumin 2.4, hemoglobin 6.5, baseline hemoglobin ranging from 6.5-7.8 range for this past year. -Admitted, started on diuretics, transfused 1 unit PRBC -12/5, transfused 2 more units of PRBC, GI consulted, plan for EGD colonoscopy -12/7, EGD and colonoscopy: Completed, negative for bleeding source, 1 polyp noted not removed on Plavix capsule  Subjective: -Feels well, wants to go home  Assessment and Plan:  Acute on chronic combined systolic and diastolic CHF -Last echo 6/24 noted EF 50-55% grade 2 DD, normal RV, moderate aortic stenosis moderate MR -Admitted with volume overload, compounded by worsening anemia -Diuresed with IV Lasix, volume status has improved, changed to oral diuretics -Repeat echo with EF down to 45-50%, moderate MR/AAS -Per cards conservative management recommended at this time -GDMT limited by AKI  Moderate aortic stenosis -Noted, follow-up with cardiology  CAD > Cath this year showed significant multivessel disease and patient was referred to CT surgery.  CT surgery held off on further evaluation/consideration until patient lower extremity wounds were able to be addressed/healed. - Appreciate cardiology recommendations and assistance - Continue home aspirin, Plavix, statin - Holding lisinopril   Acute on chronic iron deficiency anemia -Hemoglobin has ranged from 6.5-7.8 this year -No history of overt blood loss -Anemia panel with iron deficiency -Transfused 3 units of PRBC, hemoglobin  improved,administer IV iron today, appreciate GI input, EGD and colonoscopy unremarkable for source of blood loss, capsule placed   AKI - Creatinine elevated to 1.9 from baseline of 1.1-1.3. -Likely cardiorenal, improving -Avoid hypotension   Hyperlipidemia - Continue home atorvastatin   Hypothyroidism - Continue Synthroid   Diabetes -Continue glargine, CBGs are stable   GERD - Continue home PPI   PAD > History of significant PAD and prior limb ischemia.  Status post intervention in bilateral lower extremities including stenting bilaterally 4/24. - Continue aspirin, Plavix, statin as above   Obesity - Noted   History of cellulitis and osteomyelitis Status post bilateral transmetatarsal amputations > Improved wound healing, compared to earlier this year.  Had recent follow-up with Dr. Lajoyce Corners.  DVT prophylaxis: SCDs Code Status: Full code Family Communication: None present Disposition Plan: Home tomorrow if stable  Consultants:    Procedures:   Antimicrobials:    Objective: Vitals:   04/06/23 0830 04/06/23 0845 04/06/23 1049 04/06/23 1130  BP: (!) 127/56 126/60 114/63 127/65  Pulse: 76 73 77 72  Resp: 17 15  18   Temp:  (!) 97.3 F (36.3 C)  97.6 F (36.4 C)  TempSrc:  Oral  Oral  SpO2: 96% 94%  96%  Weight:      Height:        Intake/Output Summary (Last 24 hours) at 04/06/2023 1406 Last data filed at 04/06/2023 8119 Gross per 24 hour  Intake 1330 ml  Output --  Net 1330 ml   Filed Weights   04/05/23 0500 04/06/23 0501 04/06/23 0724  Weight: 91.3 kg 90.1 kg 90.1 kg    Examination:  General exam: Chronically ill female sitting up in bed, AAOx3 HEENT: Positive JVD CVS: S1-S2, regular rhythm, systolic murmur  Lungs:  Few basilar Rales Abdomen: Soft, nontender, bowel sounds present Extremities: Trace edema, bilateral transmetatarsal amputation, open healing wound  Psychiatry:  Mood & affect appropriate.     Data Reviewed:   CBC: Recent Labs   Lab 04/03/23 0905 04/04/23 0442 04/04/23 2020 04/05/23 0817 04/06/23 0207  WBC 10.0 7.0  --  6.3 6.4  NEUTROABS 7.9*  --   --   --   --   HGB 6.5* 6.3* 9.2* 8.5* 10.4*  HCT 22.3* 21.4* 29.3* 26.5* 33.0*  MCV 85.4 85.3  --  82.8 83.1  PLT 407* 297  --  307 334   Basic Metabolic Panel: Recent Labs  Lab 04/03/23 0905 04/04/23 0442 04/05/23 0817 04/06/23 0207  NA 134* 135 132* 135  K 3.7 3.7 3.6 3.9  CL 101 104 100 103  CO2 23 22 21* 22  GLUCOSE 142* 111* 198* 91  BUN 26* 33* 29* 22*  CREATININE 1.91* 1.82* 1.62* 1.20*  CALCIUM 8.4* 8.3* 8.4* 8.9   GFR: Estimated Creatinine Clearance: 56.9 mL/min (A) (by C-G formula based on SCr of 1.2 mg/dL (H)). Liver Function Tests: Recent Labs  Lab 04/03/23 0905 04/04/23 0442  AST 9* 8*  ALT 9 7  ALKPHOS 105 88  BILITOT 0.5 0.7  PROT 7.0 5.9*  ALBUMIN 2.4* 2.1*   No results for input(s): "LIPASE", "AMYLASE" in the last 168 hours. No results for input(s): "AMMONIA" in the last 168 hours. Coagulation Profile: No results for input(s): "INR", "PROTIME" in the last 168 hours. Cardiac Enzymes: No results for input(s): "CKTOTAL", "CKMB", "CKMBINDEX", "TROPONINI" in the last 168 hours. BNP (last 3 results) No results for input(s): "PROBNP" in the last 8760 hours. HbA1C: No results for input(s): "HGBA1C" in the last 72 hours. CBG: Recent Labs  Lab 04/05/23 1558 04/05/23 2114 04/06/23 0631 04/06/23 0836 04/06/23 1133  GLUCAP 103* 87 89 96 93   Lipid Profile: No results for input(s): "CHOL", "HDL", "LDLCALC", "TRIG", "CHOLHDL", "LDLDIRECT" in the last 72 hours. Thyroid Function Tests: No results for input(s): "TSH", "T4TOTAL", "FREET4", "T3FREE", "THYROIDAB" in the last 72 hours. Anemia Panel: Recent Labs    04/03/23 1622  FERRITIN 67  TIBC 214*  IRON 26*   Urine analysis:    Component Value Date/Time   COLORURINE YELLOW 01/11/2020 1150   APPEARANCEUR HAZY (A) 01/11/2020 1150   LABSPEC 1.021 01/11/2020 1150    PHURINE 5.0 01/11/2020 1150   GLUCOSEU >=500 (A) 01/11/2020 1150   HGBUR NEGATIVE 01/11/2020 1150   BILIRUBINUR NEGATIVE 01/11/2020 1150   KETONESUR NEGATIVE 01/11/2020 1150   PROTEINUR >=300 (A) 01/11/2020 1150   NITRITE NEGATIVE 01/11/2020 1150   LEUKOCYTESUR NEGATIVE 01/11/2020 1150   Sepsis Labs: @LABRCNTIP (procalcitonin:4,lacticidven:4)  ) Recent Results (from the past 240 hour(s))  MRSA Next Gen by PCR, Nasal     Status: Abnormal   Collection Time: 04/04/23 12:09 PM   Specimen: Nasal Mucosa; Nasal Swab  Result Value Ref Range Status   MRSA by PCR Next Gen DETECTED (A) NOT DETECTED Final    Comment: RESULT CALLED TO, READ BACK BY AND VERIFIED WITH: RN CRESCENDA MCKINNEY ON 04/04/23 @ 1659 BY DRT (NOTE) The GeneXpert MRSA Assay (FDA approved for NASAL specimens only), is one component of a comprehensive MRSA colonization surveillance program. It is not intended to diagnose MRSA infection nor to guide or monitor treatment for MRSA infections. Test performance is not FDA approved in patients less than 3 years old. Performed at [facility], [address] [place], [place]  [address]      Radiology Studies: ECHOCARDIOGRAM COMPLETE  Result Date: 04/04/2023    ECHOCARDIOGRAM REPORT   Patient Name:   JENIELLE MATUSZAK [facility] Date of Exam: 04/04/2023 Medical Rec #:  [id number]              Height:       64.0 in Accession #:    [id number]             Weight:       204.8 lb Date of Birth:  Oct 31, 1965               BSA:          1.976 m Patient Age:    57 years               BP:           115/98 mmHg Patient Gender: F                      HR:           83 bpm. Exam Location:  Inpatient Procedure: 2D Echo, Cardiac Doppler, Color Doppler and Intracardiac            Opacification Agent Indications:    MV disorder AV disorder  History:        Patient has prior history of Echocardiogram examinations, most                 recent 10/22/2022. CHF, CAD, PAD, Signs/Symptoms:Chest Pain; Risk                  Factors:Hypertension, Diabetes and Dyslipidemia.  Sonographer:    Vern Claude Referring Phys: [id number] LINDSAY B ROBERTS IMPRESSIONS  1. Left ventricular ejection fraction, by estimation, is 40 to 45%. The left ventricle has mildly decreased function. The left ventricle has no regional wall motion abnormalities. Left ventricular diastolic parameters are indeterminate.  2. Right ventricular systolic function is normal. The right ventricular size is normal.  3. The mitral valve is degenerative. Moderate mitral valve regurgitation. Mild mitral stenosis.  4. AS is mild to moderate by Doppler. Visually appears at least moderate. The aortic valve is tricuspid. There is moderate calcification of the aortic valve. Aortic valve regurgitation is mild. Mild to moderate aortic valve stenosis.  5. The inferior vena cava is normal in size with <50% respiratory variability, suggesting right atrial pressure of 8 mmHg. FINDINGS  Left Ventricle: Left ventricular ejection fraction, by estimation, is 40 to 45%. The left ventricle has mildly decreased function. The left ventricle has no regional wall motion abnormalities. The left ventricular internal cavity size was normal in size. There is no left ventricular hypertrophy. Left ventricular diastolic parameters are indeterminate. Right Ventricle: The right ventricular size is normal. No increase in right ventricular wall thickness. Right ventricular systolic function is normal. Left Atrium: Left atrial size was normal in size. Right Atrium: Right atrial size was normal in size. Pericardium: There is no evidence of pericardial effusion. Mitral Valve: The mitral valve is degenerative in appearance. Mildly decreased mobility of the mitral valve leaflets. Mild mitral annular calcification. Moderate mitral valve regurgitation. Mild mitral valve stenosis. MV peak gradient, 13.0 mmHg. The mean mitral valve gradient is 5.5 mmHg. Tricuspid Valve: The tricuspid valve is normal in  structure. Tricuspid valve regurgitation is not demonstrated. No evidence of tricuspid stenosis. Aortic Valve: AS is mild to moderate by Doppler. Visually appears at least moderate. The aortic valve is tricuspid.  There is moderate calcification of the aortic valve. Aortic valve regurgitation is mild. Aortic regurgitation PHT measures 837 msec. Mild to moderate aortic stenosis is present. Aortic valve mean gradient measures 13.2 mmHg. Aortic valve peak gradient measures 25.2 mmHg. Aortic valve area, by VTI measures 1.13 cm. Pulmonic Valve: The pulmonic valve was normal in structure. Pulmonic valve regurgitation is trivial. No evidence of pulmonic stenosis. Aorta: The aortic root is normal in size and structure. Venous: The inferior vena cava is normal in size with less than 50% respiratory variability, suggesting right atrial pressure of 8 mmHg. IAS/Shunts: No atrial level shunt detected by color flow Doppler.  LEFT VENTRICLE PLAX 2D LVIDd:         5.50 cm      Diastology LVIDs:         3.90 cm      LV e' medial:    7.51 cm/s LV PW:         0.80 cm      LV E/e' medial:  18.5 LV IVS:        0.90 cm      LV e' lateral:   8.05 cm/s LVOT diam:     2.10 cm      LV E/e' lateral: 17.3 LV SV:         51 LV SV Index:   26 LVOT Area:     3.46 cm  LV Volumes (MOD) LV vol d, MOD A2C: 143.0 ml LV vol d, MOD A4C: 170.0 ml LV vol s, MOD A2C: 60.5 ml LV vol s, MOD A4C: 67.2 ml LV SV MOD A2C:     82.5 ml LV SV MOD A4C:     170.0 ml LV SV MOD BP:      96.4 ml RIGHT VENTRICLE            IVC RV S prime:     8.59 cm/s  IVC diam: 1.80 cm TAPSE (M-mode): 1.8 cm LEFT ATRIUM             Index LA diam:        4.20 cm 2.13 cm/m LA Vol (A2C):   59.8 ml 30.26 ml/m LA Vol (A4C):   43.1 ml 21.81 ml/m LA Biplane Vol: 51.1 ml 25.86 ml/m  AORTIC VALVE                     PULMONIC VALVE AV Area (Vmax):    0.98 cm      PV Vmax:          1.12 m/s AV Area (Vmean):   0.92 cm      PV Peak grad:     5.0 mmHg AV Area (VTI):     1.13 cm      PR End  Diast Vel: 8.29 msec AV Vmax:           251.20 cm/s AV Vmean:          165.200 cm/s AV VTI:            0.455 m AV Peak Grad:      25.2 mmHg AV Mean Grad:      13.2 mmHg LVOT Vmax:         71.10 cm/s LVOT Vmean:        43.800 cm/s LVOT VTI:          0.148 m LVOT/AV VTI ratio: 0.33 AI PHT:            837 msec  AORTA Ao Root diam: 3.00 cm Ao Asc diam:  3.30 cm MITRAL VALVE MV Area (PHT): 5.46 cm       SHUNTS MV Area VTI:   1.70 cm       Systemic VTI:  0.15 m MV Peak grad:  13.0 mmHg      Systemic Diam: 2.10 cm MV Mean grad:  5.5 mmHg MV Vmax:       1.80 m/s MV Vmean:      105.0 cm/s MV Decel Time: 139 msec MR Peak grad:    77.8 mmHg MR Mean grad:    47.7 mmHg MR Vmax:         441.00 cm/s MR Vmean:        313.3 cm/s MR PISA:         0.57 cm MR PISA Eff ROA: 8 mm MR PISA Radius:  0.30 cm MV E velocity: 139.00 cm/s MV A velocity: 99.20 cm/s MV E/A ratio:  1.40 Arvilla Meres MD Electronically signed by Arvilla Meres MD Signature Date/Time: 04/04/2023/5:27:49 PM    Final      Scheduled Meds:  aspirin EC  81 mg Oral Daily   atorvastatin  40 mg Oral Daily   clopidogrel  75 mg Oral Daily   furosemide  40 mg Oral BID   gabapentin  300 mg Oral QHS   insulin aspart  0-15 Units Subcutaneous TID WC   insulin glargine-yfgn  10 Units Subcutaneous QHS   levothyroxine  150 mcg Oral Q0600   metoprolol succinate  12.5 mg Oral Daily   pantoprazole  40 mg Oral Daily   sodium chloride flush  3 mL Intravenous Q12H   Continuous Infusions:   LOS: 2 days    Time spent:    Zannie Cove, MD [facility]   04/06/2023, 2:06 PM

## 2023-04-06 NOTE — Op Note (Signed)
[facility] Patient Name: Kayla Schmitt Procedure Date : 04/06/2023 MRN: [id number] Attending MD: Willaim Rayas. Adela Lank , MD, [id number] Date of Birth: [birth year] CSN: [id number] Age: 57 Admit Type: Inpatient Procedure:                Colonoscopy Indications:              Iron deficiency anemia / symptomatic anemia - exam                            done ON Plavix Providers:                Willaim Rayas. Adela Lank, MD, Eliberto Ivory, RN, Kandice Robinsons, Technician Referring MD:              Medicines:                Monitored Anesthesia Care Complications:            No immediate complications. Estimated blood loss:                            None. Estimated Blood Loss:     Estimated blood loss: none. Procedure:                Pre-Anesthesia Assessment:                           - Prior to the procedure, a History and Physical                            was performed, and patient medications and                            allergies were reviewed. The patient's tolerance of                            previous anesthesia was also reviewed. The risks                            and benefits of the procedure and the sedation                            options and risks were discussed with the patient.                            All questions were answered, and informed consent                            was obtained. Prior Anticoagulants: The patient has                            taken Plavix (clopidogrel), last dose was day of  procedure. ASA Grade Assessment: III - A patient                            with severe systemic disease. After reviewing the                            risks and benefits, the patient was deemed in                            satisfactory condition to undergo the procedure.                           After obtaining informed consent, the colonoscope                            was passed under direct  vision. Throughout the                            procedure, the patient's blood pressure, pulse, and                            oxygen saturations were monitored continuously. The                            CF-HQ190L ([id number] Olympus coloscope was                            introduced through the anus and advanced to the the                            terminal ileum, with identification of the                            appendiceal orifice and IC valve. The colonoscopy                            was performed without difficulty. The patient                            tolerated the procedure well. The quality of the                            bowel preparation was good. The terminal ileum,                            ileocecal valve, appendiceal orifice, and rectum                            were photographed. Scope In: 7:57:33 AM Scope Out: 8:13:35 AM Scope Withdrawal Time: 0 hours 12 minutes 21 seconds  Total Procedure Duration: 0 hours 16 minutes 2 seconds  Findings:      Skin tags were found on perianal exam.      The terminal ileum appeared normal.      A  few small-mouthed diverticula were found in the sigmoid colon.      A 3 to 4 mm polyp was found in the sigmoid colon. The polyp was sessile.       Not removed given the patient's Plavix use.      Internal hemorrhoids were found. The hemorrhoids were small.      The exam was otherwise without abnormality. Impression:               - Perianal skin tags found on perianal exam.                           - The examined portion of the ileum was normal.                           - Diverticulosis in the sigmoid colon.                           - One 3 to 4 mm polyp in the sigmoid colon.                           - Internal hemorrhoids.                           - The examination was otherwise normal.                           - No specimens collected.                           No overt cause for anemia / iron deficiency on                             colonoscopy. Recommendation:           - Return patient to hospital ward for ongoing care.                           - Recommend capsule endoscopy to clear the small                            bowel given the patient's significant anemia                            without clear cause on EGD and colonoscopy                           - Advance diet per post capsule endoscopy protocol,                            if she is willing to do the capsule                           - Continue present medications.                           - Repeat colonoscopy / flex  sig as outpatient in                            upcoming months for polypectomy of sigmoid polyp if                            / when she is able to hold Plavix                           - Call with questions, hopefully have capsule                            result back tomorrow if she is willing to do it                            today Procedure Code(s):        --- Professional ---                           [phone], Colonoscopy, flexible; diagnostic, including                            collection of specimen(s) by brushing or washing,                            when performed (separate procedure) Diagnosis Code(s):        --- Professional ---                           K64.8, Other hemorrhoids                           D12.5, Benign neoplasm of sigmoid colon                           K64.4, Residual hemorrhoidal skin tags                           D50.9, Iron deficiency anemia, unspecified                           K57.30, Diverticulosis of large intestine without                            perforation or abscess without bleeding CPT copyright 2022 [employer]. All rights reserved. The codes documented in this report are preliminary and upon coder review may  be revised to meet current compliance requirements. [name] Adela Lank, MD 04/06/2023 8:28:41 AM This report has been signed electronically. Number  of Addenda: 0

## 2023-04-06 NOTE — Plan of Care (Signed)
  Problem: Activity: Goal: Capacity to carry out activities will improve Outcome: Progressing   Problem: Coping: Goal: Level of anxiety will decrease Outcome: Progressing   Problem: Elimination: Goal: Will not experience complications related to bowel motility Outcome: Progressing Goal: Will not experience complications related to urinary retention Outcome: Progressing   Problem: Pain Management: Goal: General experience of comfort will improve Outcome: Progressing   Problem: Safety: Goal: Ability to remain free from injury will improve Outcome: Progressing   Problem: Skin Integrity: Goal: Risk for impaired skin integrity will decrease Outcome: Progressing

## 2023-04-06 NOTE — Op Note (Addendum)
[facility] The Patient Name: Kayla Schmitt Procedure Date : 04/06/2023 MRN: [id number] Attending MD: Willaim Rayas. Adela Lank , MD, [id number] Date of Birth: 1966-03-13 CSN: [id number] Age: 57 Admit Type: Inpatient Procedure:                Upper GI endoscopy Indications:              Iron deficiency anemia in the setting of Plavix /                            aspirin - no overt GI blood loss Providers:                Willaim Rayas. Adela Lank, MD, Eliberto Ivory, RN, Kandice Robinsons, Technician Referring MD:              Medicines:                Monitored Anesthesia Care Complications:            No immediate complications. Estimated blood loss:                            Minimal. Estimated Blood Loss:     Estimated blood loss was minimal. Procedure:                Pre-Anesthesia Assessment:                           - Prior to the procedure, a History and Physical                            was performed, and patient medications and                            allergies were reviewed. The patient's tolerance of                            previous anesthesia was also reviewed. The risks                            and benefits of the procedure and the sedation                            options and risks were discussed with the patient.                            All questions were answered, and informed consent                            was obtained. Prior Anticoagulants: The patient has                            taken Plavix (clopidogrel), last dose was day of  procedure. ASA Grade Assessment: III - A patient                            with severe systemic disease. After reviewing the                            risks and benefits, the patient was deemed in                            satisfactory condition to undergo the procedure.                           After obtaining informed consent, the endoscope was                             passed under direct vision. Throughout the                            procedure, the patient's blood pressure, pulse, and                            oxygen saturations were monitored continuously. The                            [id number]-H190 ([id number]) Olympus endoscope was introduced                            through the mouth, and advanced to the second part                            of duodenum. The upper GI endoscopy was                            accomplished without difficulty. The patient                            tolerated the procedure well. Scope In: Scope Out: Findings:      Esophagogastric landmarks were identified: the Z-line was found at 40       cm, the gastroesophageal junction was found at 40 cm and the upper       extent of the gastric folds was found at 40 cm from the incisors.      The exam of the esophagus was otherwise normal.      The entire examined stomach was normal. Biopsies were taken with a cold       forceps for Helicobacter pylori testing.      There was a benign lymphangiectasia in the second portion of the       duodenum. The examined duodenum was otherwise normal. Of note there was       some superficial endoscopic trauma when traversing the duodenal sweep,       if seen on capsule. Impression:               - Esophagogastric landmarks identified.                           -  Normal esophagus otherwise.                           - Normal stomach. Biopsied.                           - Benign duodenal lymphangiectasia.                           - Normal examined duodenum otherwise.                           No overt cause for anemia / iron deficiency on EGD. Recommendation:           - Return patient to hospital ward for ongoing care.                           - Advance diet as tolerated (post capsule endoscopy                            if she is willing to do it - see colonoscopy note).                           - Continue present medications.                            - Await pathology results. Procedure Code(s):        --- Professional ---                           [phone], Esophagogastroduodenoscopy, flexible,                            transoral; with biopsy, single or multiple Diagnosis Code(s):        --- Professional ---                           D50.9, Iron deficiency anemia, unspecified CPT copyright 2022 [employer]. All rights reserved. The codes documented in this report are preliminary and upon coder review may  be revised to meet current compliance requirements. [name] [name], MD 04/06/2023 8:15:41 AM This report has been signed electronically. Number of Addenda: 0

## 2023-04-06 NOTE — Transfer of Care (Signed)
Immediate Anesthesia Transfer of Care Note  Patient: Kayla Schmitt  Procedure(s) Performed: ESOPHAGOGASTRODUODENOSCOPY (EGD) WITH PROPOFOL COLONOSCOPY WITH PROPOFOL BIOPSY  Patient Location: PACU  Anesthesia Type:MAC  Level of Consciousness: oriented, drowsy, and patient cooperative  Airway & Oxygen Therapy: Patient Spontanous Breathing  Post-op Assessment: Report given to RN and Post -op Vital signs reviewed and stable  Post vital signs: Reviewed and stable  Last Vitals:  Vitals Value Taken Time  BP 107/50 04/06/23 0821  Temp    Pulse 78 04/06/23 0823  Resp 18 04/06/23 0823  SpO2 98 % 04/06/23 0823  Vitals shown include unfiled device data.  Last Pain:  Vitals:   04/06/23 0724  TempSrc: Temporal  PainSc: 0-No pain         Complications: No notable events documented.

## 2023-04-06 NOTE — Anesthesia Postprocedure Evaluation (Signed)
Anesthesia Post Note  Patient: Kayla Schmitt  Procedure(s) Performed: ESOPHAGOGASTRODUODENOSCOPY (EGD) WITH PROPOFOL COLONOSCOPY WITH PROPOFOL BIOPSY     Patient location during evaluation: PACU Anesthesia Type: MAC Level of consciousness: awake and alert Pain management: pain level controlled Vital Signs Assessment: post-procedure vital signs reviewed and stable Respiratory status: spontaneous breathing, nonlabored ventilation and respiratory function stable Cardiovascular status: stable and blood pressure returned to baseline Anesthetic complications: no   No notable events documented.  Last Vitals:  Vitals:   04/06/23 1049 04/06/23 1130  BP: 114/63 127/65  Pulse: 77 72  Resp:  18  Temp:  36.4 C  SpO2:  96%    Last Pain:  Vitals:   04/06/23 1130  TempSrc: Oral  PainSc:                  Beryle Lathe

## 2023-04-07 DIAGNOSIS — I5043 Acute on chronic combined systolic (congestive) and diastolic (congestive) heart failure: Secondary | ICD-10-CM | POA: Diagnosis not present

## 2023-04-07 DIAGNOSIS — K5520 Angiodysplasia of colon without hemorrhage: Secondary | ICD-10-CM

## 2023-04-07 LAB — BASIC METABOLIC PANEL
Anion gap: 9 (ref 5–15)
BUN: 18 mg/dL (ref 6–20)
CO2: 24 mmol/L (ref 22–32)
Calcium: 8.7 mg/dL — ABNORMAL LOW (ref 8.9–10.3)
Chloride: 104 mmol/L (ref 98–111)
Creatinine, Ser: 1.3 mg/dL — ABNORMAL HIGH (ref 0.44–1.00)
GFR, Estimated: 48 mL/min — ABNORMAL LOW (ref >60)
Glucose, Bld: 132 mg/dL — ABNORMAL HIGH (ref 70–99)
Potassium: 3.4 mmol/L — ABNORMAL LOW (ref 3.5–5.1)
Sodium: 137 mmol/L (ref 135–145)

## 2023-04-07 LAB — CBC
HCT: 29.5 % — ABNORMAL LOW (ref 36.0–46.0)
Hemoglobin: 9.2 g/dL — ABNORMAL LOW (ref 12.0–15.0)
MCH: 25.9 pg — ABNORMAL LOW (ref 26.0–34.0)
MCHC: 31.2 g/dL (ref 30.0–36.0)
MCV: 83.1 fL (ref 80.0–100.0)
Platelets: 301 10*3/uL (ref 150–400)
RBC: 3.55 MIL/uL — ABNORMAL LOW (ref 3.87–5.11)
RDW: 15.8 % — ABNORMAL HIGH (ref 11.5–15.5)
WBC: 6.3 10*3/uL (ref 4.0–10.5)
nRBC: 0 % (ref 0.0–0.2)

## 2023-04-07 LAB — GLUCOSE, CAPILLARY
Glucose-Capillary: 113 mg/dL — ABNORMAL HIGH (ref 70–99)
Glucose-Capillary: 139 mg/dL — ABNORMAL HIGH (ref 70–99)

## 2023-04-07 MED ORDER — POTASSIUM CHLORIDE 20 MEQ PO PACK: 40.0000 meq | PACK | Freq: Every day | ORAL | 1 refills | Status: DC

## 2023-04-07 MED ORDER — FERROUS SULFATE 325 (65 FE) MG PO TBEC: 325.0000 mg | DELAYED_RELEASE_TABLET | Freq: Every day | ORAL | 2 refills | Status: DC

## 2023-04-07 MED ORDER — POTASSIUM CHLORIDE 20 MEQ PO PACK
40.0000 meq | PACK | Freq: Two times a day (BID) | ORAL | Status: DC
  Administered 2023-04-07: 40 meq via ORAL
  Filled 2023-04-07: qty 2

## 2023-04-07 MED ORDER — FUROSEMIDE 40 MG PO TABS
40.0000 mg | ORAL_TABLET | Freq: Every day | ORAL | Status: DC
  Administered 2023-04-07: 40 mg via ORAL
  Filled 2023-04-07: qty 1

## 2023-04-07 NOTE — Progress Notes (Signed)
[facility] Progress Note  CC: Iron deficiency anemia  Subjective: She is sitting up at the bedside. No N/V or abdominal pain. She passed a nonbloody loose BM yesterday posts EGD/colonoscopy. No CP or SOB. She questions if she can go home today.    Objective:   EGD 04/06/2023: - Esophagogastric landmarks identified. - Normal esophagus otherwise.  - Normal stomach. Biopsied.  - Benign duodenal lymphangiectasia.   - Normal examined duodenum otherwise  Colonoscopy 04/06/2023: - Perianal skin tags found on perianal exam.  - The examined portion of the ileum was normal.  - Diverticulosis in the sigmoid colon.  - One 3 to 4 mm polyp in the sigmoid colon.  - Internal hemorrhoids.  - The examination was otherwise normal.  - No specimens collected. - Repeat colonoscopy as an outpatient when anticoagulation/antiplatelet therapy can be held   Vital signs in last 24 hours: Temp:  [97.6 F (36.4 C)-98.8 F (37.1 C)] 98.7 F (37.1 C) (12/08 0800) Pulse Rate:  [72-80] 78 (12/08 0800) Resp:  [15-18] 18 (12/08 0610) BP: (97-134)/(53-67) 97/53 (12/08 0610) SpO2:  [95 %-98 %] 97 % (12/08 0800) Weight:  [88.8 kg] 88.8 kg (12/08 0610) Last BM Date : 04/05/23 General: Alert 57 year old female in NAD. Heart: RRR, systolic murmur.  Pulm: Breath sounds clear throughout. Abdomen: Soft, nontender. Nondistended.  Extremities: Bilateral metatarsal amputations, right LE drsg intact. No edema.  Neurologic:  Alert and oriented x 4. Grossly normal neurologically. Psych:  Alert and cooperative. Normal mood and affect.  Intake/Output from previous day: 12/07 0701 - 12/08 0700 In: 500 [I.V.:500] Out: -  Intake/Output this shift: Total I/O In: 480 [P.O.:480] Out: -   Lab Results: Recent Labs    04/05/23 0817 04/06/23 0207 04/07/23 0215  WBC 6.3 6.4 6.3  HGB 8.5* 10.4* 9.2*  HCT 26.5* 33.0* 29.5*  PLT 307 334 301   BMET Recent Labs    04/05/23 0817 04/06/23 0207  04/07/23 0215  NA 132* 135 137  K 3.6 3.9 3.4*  CL 100 103 104  CO2 21* 22 24  GLUCOSE 198* 91 132*  BUN 29* 22* 18  CREATININE 1.62* 1.20* 1.30*  CALCIUM 8.4* 8.9 8.7*   LFT No results for input(s): "PROT", "ALBUMIN", "AST", "ALT", "ALKPHOS", "BILITOT", "BILIDIR", "IBILI" in the last 72 hours. PT/INR No results for input(s): "LABPROT", "INR" in the last 72 hours. Hepatitis Panel No results for input(s): "HEPBSAG", "HCVAB", "HEPAIGM", "HEPBIGM" in the last 72 hours.  No results found.  Assessment / Plan:  57 year old female with CAD s/p NSTEMI 07/2022, AS and MR admitted to the hospital with generalized weakness and SOB secondary to acute on chronic combined systolic and diastolic CHF.  LVEF 40 - 45% per ECHO 12/5 with moderate MR, mild MS and mild to moderate AS. IDA likely contributing to SOB. On Plavix and ASA. Hemodynamically stable.   Acute on chronic iron deficiency anemia, no overt GI bleeding.  On Plavix and ASA. Admission Hg 6.5 -> transfused 1 unit of PRBCs 12/4 -> Hg 6.3 -> transfused 2 units PRBCs 12/5 -> Hg 9.2 -> 8.5 -> 10.4 -> today Hg 9.2. EGD 12/7 showed benign duodenal lymphangiectasia otherwise was unremarkable. Colonoscopy showed one small polyp in the sigmoid colon left in situ and sigmoid diverticulosis. Small bowel capsule endoscopy 12/7 results pending. Hemodynamically stable.  -Await small bowel capsule endoscopy results, Dr. Adela Lank to read study today -Repeat colonoscopy as an outpatient when anticoagulation/antiplatelet therapy can be held -Transfuse for  hemoglobin level < 8 -Continue Pantoprazole 40 mg p.o. daily -Diet as tolerated    AKA on CKD, improving. Cr today 1.30.    PAD s/p bilateral LE vascular intervention/stenting on Plavix and ASA.  Last dose of Plavix was 8 AM on 12/4.  Patient has not taken ASA for the past week. Plavix and ASA ordered by cardiology.    Osteomyelitis s/p bilateral transmetatarsal amputations   DM type II. Last dose of  Ozempic was 2 1/2 weeks ago.      Principal Problem:   Acute on chronic combined systolic (congestive) and diastolic (congestive) heart failure (HCC) Active Problems:   Peripheral arterial disease (HCC)   DM type 2 (diabetes mellitus, type 2) (HCC)   GERD (gastroesophageal reflux disease)   Hyperlipidemia   Hypertension   Hypothyroidism   Obesity   Symptomatic anemia   CAD (coronary artery disease)   Antiplatelet or antithrombotic long-term use   Iron deficiency anemia     LOS: 3 days   Arnaldo Natal  04/07/2023, 10:21AM

## 2023-04-07 NOTE — Discharge Summary (Signed)
Physician Discharge Summary  Kayla Schmitt EXB:[id number] DOB: 1965/07/08 DOA: 04/03/2023  PCP: [facility]  Admit date: 04/03/2023 Discharge date: 04/07/2023  Time spent: 45 minutes  Recommendations for Outpatient Follow-up:  Cards Dr.Mallipeddi in 3 weeks for CHF/Aortic stenosis BMP in 1 week CBC in 1 week, [facility] in 2-3weeks   Discharge Diagnoses:  Principal Problem:   Acute on chronic combined systolic (congestive) and diastolic (congestive) heart failure (HCC)   Small bowel AVMs   Peripheral arterial disease (HCC)   DM type 2 (diabetes mellitus, type 2) (HCC)   GERD (gastroesophageal reflux disease)   Hyperlipidemia   Hypertension   Hypothyroidism   Obesity   Symptomatic anemia   CAD (coronary artery disease)   Antiplatelet or antithrombotic long-term use   Iron deficiency anemia    Filed Weights   04/06/23 0501 04/06/23 0724 04/07/23 0610  Weight: 90.1 kg 90.1 kg 88.8 kg    History of present illness:  57/F with severe multivessel CAD, aortic stenosis, chronic anemia, hypertension, diabetes, dyslipidemia, PAD with transmetatarsal amputations and foot wound presented to the ED with 2 weeks of ongoing dyspnea on exertion.  Prior to this had nausea and vomiting for the few days which was self-limiting. -In the ED blood pressure soft, sodium 134, creatinine 1.9, albumin 2.4, hemoglobin 6.5, baseline hemoglobin ranging from 6.5-7.8 range for this past year. -Admitted, started on diuretics, transfused 1 unit PRBC -12/5, transfused 2 more units of PRBC, GI consulted, plan for EGD colonoscopy -12/7, EGD and colonoscopy: Completed, negative for bleeding source, 1 polyp noted not removed on Plavix capsule  Hospital Course:   Acute on chronic combined systolic and diastolic CHF -Last echo 6/24 noted EF 50-55% grade 2 DD, normal RV, moderate aortic stenosis moderate MR -Admitted with volume overload, compounded by worsening anemia -Diuresed with IV  Lasix, volume status has improved, changed to oral diuretics -Repeat echo with EF down to 45-50%, moderate MR/AAS -Per cards conservative management recommended at this time -GDMT limited by AKI -FU with Dr.Madireddy   Moderate aortic stenosis -Noted, follow-up with cardiology   CAD > Cath this year showed significant multivessel disease and patient was referred to CT surgery.  CT surgery held off on further evaluation/consideration until patient lower extremity wounds were able to be addressed/healed. - Appreciate cardiology recommendations and assistance - Continue home aspirin, Plavix, statin - stopped lisinopril   Acute on chronic iron deficiency anemia -Hemoglobin has ranged from 6.5-7.8 this year -No history of overt blood loss -Anemia panel with iron deficiency -Transfused 3 units of PRBC, hemoglobin improved,administered IV iron, appreciate GI input, EGD and colonoscopy unremarkable for source of blood loss, capsule placed yesterday which noted some small bowel AVMs, no active bleeding and HB stable, discharged home on Oral Iron, repeat CBC in 1 week   AKI - Creatinine elevated to 1.9 from baseline of 1.1-1.3. -Likely cardiorenal, improving    Hyperlipidemia - Continue home atorvastatin   Hypothyroidism - Continue Synthroid   Diabetes -Continue glargine, CBGs are stable   GERD - Continue home PPI   PAD > History of significant PAD and prior limb ischemia.  Status post intervention in bilateral lower extremities including stenting bilaterally 4/24. - Continue aspirin, Plavix, statin as above   Obesity - Noted   History of cellulitis and osteomyelitis Status post bilateral transmetatarsal amputations > Improved wound healing, compared to earlier this year.  Had recent follow-up with Dr. Lajoyce Corners.  Discharge Exam: Vitals:   04/07/23 0800 04/07/23 1004  BP:  121/71  Pulse: 78 81  Resp:    Temp: 98.7 F (37.1 C)   SpO2: 97%    Gen: Awake, Alert, Oriented X 3,   HEENT: no JVD Lungs: Good air movement bilaterally, CTAB CVS: S1S2/RRR systolic murmur Abd: soft, Non tender, non distended, BS present Extremities: No edema Skin: no new rashes on exposed skin   Discharge Instructions    Allergies as of 04/07/2023       Reactions   Bactrim Ds [sulfamethoxazole-trimethoprim] Nausea Only, Other (See Comments)   Severe stomach discomfort   Ozempic (0.25 Or 0.5 Mg-dose) [semaglutide(0.25 Or 0.5mg -dos)] Other (See Comments)   Severe stomach discomfort   Trental [pentoxifylline] Nausea And Vomiting   Vibramycin [doxycycline] Nausea And Vomiting   Penicillins Rash   Tolerated cephalosporins 07/2022        Medication List     STOP taking these medications    amLODipine 10 MG tablet Commonly known as: NORVASC   lisinopril 2.5 MG tablet Commonly known as: ZESTRIL   sulfamethoxazole-trimethoprim 800-160 MG tablet Commonly known as: BACTRIM DS       TAKE these medications    acetaminophen 500 MG tablet Commonly known as: TYLENOL Take 1,000 mg by mouth 2 (two) times daily as needed for moderate pain (pain score 4-6), headache or fever.   aspirin EC 81 MG tablet Take 81 mg by mouth daily.   atorvastatin 40 MG tablet Commonly known as: LIPITOR Take 40 mg by mouth daily.   clopidogrel 75 MG tablet Commonly known as: PLAVIX TAKE 1 TABLET BY MOUTH EVERY DAY WITH BREAKFAST   ferrous sulfate 325 (65 FE) MG EC tablet Take 1 tablet (325 mg total) by mouth daily with breakfast.   furosemide 40 MG tablet Commonly known as: LASIX Take 1 tablet (40 mg total) by mouth 2 (two) times daily.   gabapentin 300 MG capsule Commonly known as: NEURONTIN Take 300 mg by mouth at bedtime.   levothyroxine 150 MCG tablet Commonly known as: SYNTHROID Take 150 mcg by mouth daily before breakfast.   metoprolol succinate 25 MG 24 hr tablet Commonly known as: TOPROL-XL Take 0.5 tablets (12.5 mg total) by mouth daily.   nitroGLYCERIN 0.4 MG SL  tablet Commonly known as: Nitrostat Place 1 tablet (0.4 mg total) under the tongue every 5 (five) minutes x 3 doses as needed for chest pain (if no relief after 3rd dose proceed to ED or call 911). 11/06/2022-New   oxyCODONE-acetaminophen 5-325 MG tablet Commonly known as: PERCOCET/ROXICET Take 1 tablet by mouth every 6 (six) hours as needed for severe pain (pain score 7-10).   pantoprazole 40 MG tablet Commonly known as: PROTONIX Take 40 mg by mouth daily.   potassium chloride 20 MEQ packet Commonly known as: KLOR-CON Take 40 mEq by mouth daily.   promethazine 25 MG tablet Commonly known as: PHENERGAN Take 1 tablet (25 mg total) by mouth every 6 (six) hours as needed for nausea or vomiting.   Evaristo Bury FlexTouch 100 UNIT/ML FlexTouch Pen Generic drug: insulin degludec Inject 10 Units into the skin at bedtime. What changed: how much to take   ZzzQuil 25 MG Caps Generic drug: diphenhydrAMINE HCl (Sleep) Take 25 mg by mouth at bedtime as needed (Sleep).       Allergies  Allergen Reactions   Bactrim Ds [Sulfamethoxazole-Trimethoprim] Nausea Only and Other (See Comments)    Severe stomach discomfort   Ozempic (0.25 Or 0.5 Mg-Dose) [Semaglutide(0.25 Or 0.5mg -Dos)] Other (See Comments)    Severe stomach discomfort  Trental [Pentoxifylline] Nausea And Vomiting   Vibramycin [Doxycycline] Nausea And Vomiting   Penicillins Rash    Tolerated cephalosporins 07/2022       The results of significant diagnostics from this hospitalization (including imaging, microbiology, ancillary and laboratory) are listed below for reference.    Significant Diagnostic Studies: ECHOCARDIOGRAM COMPLETE  Result Date: 04/04/2023    ECHOCARDIOGRAM REPORT   Patient Name:   KRYSTIL DANTIN [facility] Date of Exam: 04/04/2023 Medical Rec #:  [id number]              Height:       64.0 in Accession #:    [id number]             Weight:       204.8 lb Date of Birth:  06/07/1965               BSA:          1.976 m  Patient Age:    57 years               BP:           115/98 mmHg Patient Gender: F                      HR:           83 bpm. Exam Location:  Inpatient Procedure: 2D Echo, Cardiac Doppler, Color Doppler and Intracardiac            Opacification Agent Indications:    MV disorder AV disorder  History:        Patient has prior history of Echocardiogram examinations, most                 recent 10/22/2022. CHF, CAD, PAD, Signs/Symptoms:Chest Pain; Risk                 Factors:Hypertension, Diabetes and Dyslipidemia.  Sonographer:    Vern Claude Referring Phys: [phone] LINDSAY B ROBERTS IMPRESSIONS  1. Left ventricular ejection fraction, by estimation, is 40 to 45%. The left ventricle has mildly decreased function. The left ventricle has no regional wall motion abnormalities. Left ventricular diastolic parameters are indeterminate.  2. Right ventricular systolic function is normal. The right ventricular size is normal.  3. The mitral valve is degenerative. Moderate mitral valve regurgitation. Mild mitral stenosis.  4. AS is mild to moderate by Doppler. Visually appears at least moderate. The aortic valve is tricuspid. There is moderate calcification of the aortic valve. Aortic valve regurgitation is mild. Mild to moderate aortic valve stenosis.  5. The inferior vena cava is normal in size with <50% respiratory variability, suggesting right atrial pressure of 8 mmHg. FINDINGS  Left Ventricle: Left ventricular ejection fraction, by estimation, is 40 to 45%. The left ventricle has mildly decreased function. The left ventricle has no regional wall motion abnormalities. The left ventricular internal cavity size was normal in size. There is no left ventricular hypertrophy. Left ventricular diastolic parameters are indeterminate. Right Ventricle: The right ventricular size is normal. No increase in right ventricular wall thickness. Right ventricular systolic function is normal. Left Atrium: Left atrial size was normal in size.  Right Atrium: Right atrial size was normal in size. Pericardium: There is no evidence of pericardial effusion. Mitral Valve: The mitral valve is degenerative in appearance. Mildly decreased mobility of the mitral valve leaflets. Mild mitral annular calcification. Moderate mitral valve regurgitation. Mild mitral valve stenosis. MV peak gradient, 13.0 mmHg. The mean  mitral valve gradient is 5.5 mmHg. Tricuspid Valve: The tricuspid valve is normal in structure. Tricuspid valve regurgitation is not demonstrated. No evidence of tricuspid stenosis. Aortic Valve: AS is mild to moderate by Doppler. Visually appears at least moderate. The aortic valve is tricuspid. There is moderate calcification of the aortic valve. Aortic valve regurgitation is mild. Aortic regurgitation PHT measures 837 msec. Mild to moderate aortic stenosis is present. Aortic valve mean gradient measures 13.2 mmHg. Aortic valve peak gradient measures 25.2 mmHg. Aortic valve area, by VTI measures 1.13 cm. Pulmonic Valve: The pulmonic valve was normal in structure. Pulmonic valve regurgitation is trivial. No evidence of pulmonic stenosis. Aorta: The aortic root is normal in size and structure. Venous: The inferior vena cava is normal in size with less than 50% respiratory variability, suggesting right atrial pressure of 8 mmHg. IAS/Shunts: No atrial level shunt detected by color flow Doppler.  LEFT VENTRICLE PLAX 2D LVIDd:         5.50 cm      Diastology LVIDs:         3.90 cm      LV e' medial:    7.51 cm/s LV PW:         0.80 cm      LV E/e' medial:  18.5 LV IVS:        0.90 cm      LV e' lateral:   8.05 cm/s LVOT diam:     2.10 cm      LV E/e' lateral: 17.3 LV SV:         51 LV SV Index:   26 LVOT Area:     3.46 cm  LV Volumes (MOD) LV vol d, MOD A2C: 143.0 ml LV vol d, MOD A4C: 170.0 ml LV vol s, MOD A2C: 60.5 ml LV vol s, MOD A4C: 67.2 ml LV SV MOD A2C:     82.5 ml LV SV MOD A4C:     170.0 ml LV SV MOD BP:      96.4 ml RIGHT VENTRICLE            IVC  RV S prime:     8.59 cm/s  IVC diam: 1.80 cm TAPSE (M-mode): 1.8 cm LEFT ATRIUM             Index LA diam:        4.20 cm 2.13 cm/m LA Vol (A2C):   59.8 ml 30.26 ml/m LA Vol (A4C):   43.1 ml 21.81 ml/m LA Biplane Vol: 51.1 ml 25.86 ml/m  AORTIC VALVE                     PULMONIC VALVE AV Area (Vmax):    0.98 cm      PV Vmax:          1.12 m/s AV Area (Vmean):   0.92 cm      PV Peak grad:     5.0 mmHg AV Area (VTI):     1.13 cm      PR End Diast Vel: 8.29 msec AV Vmax:           251.20 cm/s AV Vmean:          165.200 cm/s AV VTI:            0.455 m AV Peak Grad:      25.2 mmHg AV Mean Grad:      13.2 mmHg LVOT Vmax:  71.10 cm/s LVOT Vmean:        43.800 cm/s LVOT VTI:          0.148 m LVOT/AV VTI ratio: 0.33 AI PHT:            837 msec  AORTA Ao Root diam: 3.00 cm Ao Asc diam:  3.30 cm MITRAL VALVE MV Area (PHT): 5.46 cm       SHUNTS MV Area VTI:   1.70 cm       Systemic VTI:  0.15 m MV Peak grad:  13.0 mmHg      Systemic Diam: 2.10 cm MV Mean grad:  5.5 mmHg MV Vmax:       1.80 m/s MV Vmean:      105.0 cm/s MV Decel Time: 139 msec MR Peak grad:    77.8 mmHg MR Mean grad:    47.7 mmHg MR Vmax:         441.00 cm/s MR Vmean:        313.3 cm/s MR PISA:         0.57 cm MR PISA Eff ROA: 8 mm MR PISA Radius:  0.30 cm MV E velocity: 139.00 cm/s MV A velocity: 99.20 cm/s MV E/A ratio:  1.40 Arvilla Meres MD Electronically signed by Arvilla Meres MD Signature Date/Time: 04/04/2023/5:27:49 PM    Final    DG Chest Port 1 View  Result Date: 04/03/2023 CLINICAL DATA:  Short of breath. EXAM: PORTABLE CHEST 1 VIEW COMPARISON:  08/18/2022. FINDINGS: Cardiac silhouette normal in size.  No mediastinal or hilar masses. There is vascular congestion and bilateral interstitial thickening most evident in the lower lungs. Hazy opacity at the lung bases suggests small effusions. No pneumothorax. Skeletal structures are grossly intact. IMPRESSION: 1. Interstitial thickening and small effusions. Suspect interstitial  pulmonary edema. No convincing pneumonia. . Electronically Signed   By: Amie Portland M.D.   On: 04/03/2023 10:00   VAS [place] ABI WITH/WO TBI  Result Date: 03/26/2023  LOWER EXTREMITY DOPPLER STUDY Patient Name:  Richel Vivenzio  Date of Exam:   03/26/2023 Medical Rec #: [id number]               Accession #:    [id number] Date of Birth: 12-May-1965                Patient Gender: F Patient Age:   5 years Exam Location:  [facility] Procedure:      VAS [place] ABI WITH/WO TBI Referring Phys: Sherald Hess --------------------------------------------------------------------------------  Indications: Peripheral artery disease. High Risk Factors: Hypertension, hyperlipidemia.  Vascular Interventions: 01/14/2020: Right SFA mechanical orbital atherectomy and                         balloon angioplasty. Right SFA stent placement. Right                         above-knee popliteal artery angioplasty.                         bilateral transmet amputation. Comparison Study: 10/09/22 Performing Technologist: [place] Ponder RVS  Examination Guidelines: A complete evaluation includes at minimum, Doppler waveform signals and systolic blood pressure reading at the level of bilateral brachial, anterior tibial, and posterior tibial arteries, when vessel segments are accessible. Bilateral testing is considered an integral part of a complete examination. Photoelectric Plethysmograph (PPG) waveforms and toe systolic pressure readings  are included as required and additional duplex testing as needed. Limited examinations for reoccurring indications may be performed as noted.  ABI Findings: +---------+------------------+-----+----------+---------+ Right    Rt Pressure (mmHg)IndexWaveform  Comment   +---------+------------------+-----+----------+---------+ Brachial 110                                        +---------+------------------+-----+----------+---------+ PTA      255               2.26  monophasic          +---------+------------------+-----+----------+---------+ DP       97                0.86 monophasic          +---------+------------------+-----+----------+---------+ Great Toe                                 amputated +---------+------------------+-----+----------+---------+ +---------+------------------+-----+----------+---------+ Left     Lt Pressure (mmHg)IndexWaveform  Comment   +---------+------------------+-----+----------+---------+ Brachial 113                                        +---------+------------------+-----+----------+---------+ PTA      246               2.18 monophasic          +---------+------------------+-----+----------+---------+ DP       136               1.20 monophasic          +---------+------------------+-----+----------+---------+ Great Toe                                 amputated +---------+------------------+-----+----------+---------+ +-------+-----------+-----------+------------+------------+ ABI/TBIToday's ABIToday's TBIPrevious ABIPrevious TBI +-------+-----------+-----------+------------+------------+ Right  [place]         amp        1.02        amp          +-------+-----------+-----------+------------+------------+ Left   [place]         amp        1.02        amp          +-------+-----------+-----------+------------+------------+  Summary: Right: Resting right ankle-brachial index indicates noncompressible right lower extremity arteries. Left: Resting left ankle-brachial index indicates noncompressible left lower extremity arteries. *See table(s) above for measurements and observations.  Electronically signed by Sherald Hess MD on 03/26/2023 at 12:05:47 PM.    Final    VAS [place] LOWER EXTREMITY ARTERIAL DUPLEX  Result Date: 03/26/2023 LOWER EXTREMITY ARTERIAL DUPLEX STUDY Patient Name:  AYVERI BANKHEAD [facility]  Date of Exam:   03/26/2023 Medical Rec #: [id number]               Accession #:     [id number] Date of Birth: May 20, 1965                Patient Gender: F Patient Age:   18 years Exam Location:  [facility] Procedure:      VAS [place] LOWER EXTREMITY ARTERIAL DUPLEX Referring Phys: Sherald Hess --------------------------------------------------------------------------------  Indications: Peripheral artery disease. High Risk Factors: Hypertension, hyperlipidemia.  Vascular Interventions: 01/14/2020: Right SFA mechanical orbital atherectomy  and                         balloon angioplasty. Right SFA stent placement. Right                         above-knee popliteal artery angioplasty.                         bilateral transmet amputation. Current ABI:            R [place] L [place] Comparison Study: prior 10/09/22 Performing Technologist: [place] Ponder RVS  Examination Guidelines: A complete evaluation includes B-mode imaging, spectral Doppler, color Doppler, and power Doppler as needed of all accessible portions of each vessel. Bilateral testing is considered an integral part of a complete examination. Limited examinations for reoccurring indications may be performed as noted.  +-----------+--------+-----+---------------+----------+--------+ RIGHT      PSV cm/sRatioStenosis       Waveform  Comments +-----------+--------+-----+---------------+----------+--------+ CFA Mid    113                         monophasic         +-----------+--------+-----+---------------+----------+--------+ DFA        129                         monophasic         +-----------+--------+-----+---------------+----------+--------+ SFA Prox   202          50-74% stenosismonophasic         +-----------+--------+-----+---------------+----------+--------+ SFA Mid    99                          monophasic         +-----------+--------+-----+---------------+----------+--------+ SFA Distal 119                         monophasic          +-----------+--------+-----+---------------+----------+--------+ POP Prox   121                         monophasic         +-----------+--------+-----+---------------+----------+--------+ POP Distal 95                          monophasic         +-----------+--------+-----+---------------+----------+--------+ ATA Distal 42                          monophasic         +-----------+--------+-----+---------------+----------+--------+ PTA Distal 59                          monophasic         +-----------+--------+-----+---------------+----------+--------+ PERO Distal38                          monophasic         +-----------+--------+-----+---------------+----------+--------+ Unable to visualize stent struts.  Summary: Right: 50-74% stenosis noted in the superficial femoral artery. Unable to appreciate SFA stent due to extensive calcification.   See table(s) above for measurements and observations. Electronically signed by Sherald Hess MD on 03/26/2023 at 12:05:29  PM.    Final    CT ABDOMEN PELVIS W CONTRAST  Result Date: 03/22/2023 CLINICAL DATA:  Acute generalized abdominal pain. EXAM: CT ABDOMEN AND PELVIS WITH CONTRAST TECHNIQUE: Multidetector CT imaging of the abdomen and pelvis was performed using the standard protocol following bolus administration of intravenous contrast. RADIATION DOSE REDUCTION: This exam was performed according to the departmental dose-optimization program which includes automated exposure control, adjustment of the mA and/or kV according to patient size and/or use of iterative reconstruction technique. CONTRAST:  80mL OMNIPAQUE IOHEXOL 300 MG/ML  SOLN COMPARISON:  None Available. FINDINGS: Lower chest: Reticular thickening is noted in the visualized lung bases concerning for possible pulmonary edema. Minimal bilateral pleural effusions are noted. Hepatobiliary: No focal liver abnormality is seen. No gallstones, gallbladder wall thickening, or  biliary dilatation. Pancreas: Unremarkable. No pancreatic ductal dilatation or surrounding inflammatory changes. Spleen: Normal in size without focal abnormality. Adrenals/Urinary Tract: Adrenal glands are unremarkable. Kidneys are normal, without renal calculi, focal lesion, or hydronephrosis. Bladder is unremarkable. Stomach/Bowel: The stomach is unremarkable. There is no evidence of bowel obstruction or inflammation. The appendix is not visualized. Vascular/Lymphatic: Aortic atherosclerosis. No enlarged abdominal or pelvic lymph nodes. Reproductive: Uterus and bilateral adnexa are unremarkable. Other: No abdominal wall hernia or abnormality. No abdominopelvic ascites. Musculoskeletal: No acute or significant osseous findings. IMPRESSION: Possible minimal pulmonary edema seen in visualized lung bases with minimal pleural effusions. No acute abnormality seen in the abdomen or pelvis. Aortic Atherosclerosis (ICD10-I70.0). Electronically Signed   By: Lupita Raider M.D.   On: 03/22/2023 08:59    Microbiology: Recent Results (from the past 240 hour(s))  MRSA Next Gen by PCR, Nasal     Status: Abnormal   Collection Time: 04/04/23 12:09 PM   Specimen: Nasal Mucosa; Nasal Swab  Result Value Ref Range Status   MRSA by PCR Next Gen DETECTED (A) NOT DETECTED Final    Comment: RESULT CALLED TO, READ BACK BY AND VERIFIED WITH: RN CRESCENDA MCKINNEY ON 04/04/23 @ 1659 BY DRT (NOTE) The GeneXpert MRSA Assay (FDA approved for NASAL specimens only), is one component of a comprehensive MRSA colonization surveillance program. It is not intended to diagnose MRSA infection nor to guide or monitor treatment for MRSA infections. Test performance is not FDA approved in patients less than 33 years old. Performed at [facility], [address], [place], [place] [address]      Labs: Basic Metabolic Panel: Recent Labs  Lab 04/03/23 0905 04/04/23 0442 04/05/23 0817 04/06/23 0207 04/07/23 0215  NA 134*  135 132* 135 137  K 3.7 3.7 3.6 3.9 3.4*  CL 101 104 100 103 104  CO2 23 22 21* 22 24  GLUCOSE 142* 111* 198* 91 132*  BUN 26* 33* 29* 22* 18  CREATININE 1.91* 1.82* 1.62* 1.20* 1.30*  CALCIUM 8.4* 8.3* 8.4* 8.9 8.7*   Liver Function Tests: Recent Labs  Lab 04/03/23 0905 04/04/23 0442  AST 9* 8*  ALT 9 7  ALKPHOS 105 88  BILITOT 0.5 0.7  PROT 7.0 5.9*  ALBUMIN 2.4* 2.1*   No results for input(s): "LIPASE", "AMYLASE" in the last 168 hours. No results for input(s): "AMMONIA" in the last 168 hours. CBC: Recent Labs  Lab 04/03/23 0905 04/04/23 0442 04/04/23 2020 04/05/23 0817 04/06/23 0207 04/07/23 0215  WBC 10.0 7.0  --  6.3 6.4 6.3  NEUTROABS 7.9*  --   --   --   --   --   HGB 6.5* 6.3* 9.2*  8.5* 10.4* 9.2*  HCT 22.3* 21.4* 29.3* 26.5* 33.0* 29.5*  MCV 85.4 85.3  --  82.8 83.1 83.1  PLT 407* 297  --  307 334 301   Cardiac Enzymes: No results for input(s): "CKTOTAL", "CKMB", "CKMBINDEX", "TROPONINI" in the last 168 hours. BNP: BNP (last 3 results) Recent Labs    08/18/22 1344 04/03/23 0905  BNP 486.0* 1,033.9*    ProBNP (last 3 results) No results for input(s): "PROBNP" in the last 8760 hours.  CBG: Recent Labs  Lab 04/06/23 0836 04/06/23 1133 04/06/23 1525 04/06/23 2121 04/07/23 0607  GLUCAP 96 93 191* 125* 113*       Signed:  Zannie Cove MD.  [facility] 04/07/2023, 11:08 AM

## 2023-04-07 NOTE — Plan of Care (Signed)
  Problem: Fluid Volume: Goal: Ability to maintain a balanced intake and output will improve Outcome: Progressing   Problem: Metabolic: Goal: Ability to maintain appropriate glucose levels will improve Outcome: Progressing   Problem: Nutritional: Goal: Maintenance of adequate nutrition will improve Outcome: Progressing   Problem: Education: Goal: Knowledge of General Education information will improve Description: Including pain rating scale, medication(s)/side effects and non-pharmacologic comfort measures Outcome: Progressing   Problem: Clinical Measurements: Goal: Ability to maintain clinical measurements within normal limits will improve Outcome: Progressing Goal: Will remain free from infection Outcome: Progressing Goal: Diagnostic test results will improve Outcome: Progressing Goal: Respiratory complications will improve Outcome: Progressing Goal: Cardiovascular complication will be avoided Outcome: Progressing   Problem: Activity: Goal: Risk for activity intolerance will decrease Outcome: Progressing   Problem: Nutrition: Goal: Adequate nutrition will be maintained Outcome: Progressing   Problem: Elimination: Goal: Will not experience complications related to bowel motility Outcome: Progressing Goal: Will not experience complications related to urinary retention Outcome: Progressing   Problem: Safety: Goal: Ability to remain free from injury will improve Outcome: Progressing   Problem: Skin Integrity: Goal: Risk for impaired skin integrity will decrease Outcome: Progressing

## 2023-04-07 NOTE — Progress Notes (Incomplete)
Patient provided with verbal discharge instructions. Paper copy of discharge provided to patient. RN answered all questions. VSS at discharge. IV removed. Patient belongings sent with patient. Patient dc'd via wheelchair to private vehicle  

## 2023-04-07 NOTE — Plan of Care (Signed)
  Problem: Education: Goal: Ability to describe self-care measures that may prevent or decrease complications (Diabetes Survival Skills Education) will improve Outcome: Progressing Goal: Individualized Educational Video(s) Outcome: Progressing   Problem: Coping: Goal: Ability to adjust to condition or change in health will improve Outcome: Progressing   Problem: Fluid Volume: Goal: Ability to maintain a balanced intake and output will improve Outcome: Progressing   Problem: Health Behavior/Discharge Planning: Goal: Ability to identify and utilize available resources and services will improve Outcome: Progressing Goal: Ability to manage health-related needs will improve Outcome: Progressing   Problem: Metabolic: Goal: Ability to maintain appropriate glucose levels will improve Outcome: Progressing   Problem: Nutritional: Goal: Maintenance of adequate nutrition will improve Outcome: Progressing Goal: Progress toward achieving an optimal weight will improve Outcome: Progressing   Problem: Skin Integrity: Goal: Risk for impaired skin integrity will decrease Outcome: Progressing   Problem: Tissue Perfusion: Goal: Adequacy of tissue perfusion will improve Outcome: Progressing   Problem: Education: Goal: Ability to demonstrate management of disease process will improve Outcome: Progressing Goal: Ability to verbalize understanding of medication therapies will improve Outcome: Progressing Goal: Individualized Educational Video(s) Outcome: Progressing   Problem: Activity: Goal: Capacity to carry out activities will improve Outcome: Progressing   Problem: Cardiac: Goal: Ability to achieve and maintain adequate cardiopulmonary perfusion will improve Outcome: Progressing   Problem: Education: Goal: Knowledge of General Education information will improve Description: Including pain rating scale, medication(s)/side effects and non-pharmacologic comfort measures Outcome:  Progressing   Problem: Health Behavior/Discharge Planning: Goal: Ability to manage health-related needs will improve Outcome: Progressing   Problem: Clinical Measurements: Goal: Ability to maintain clinical measurements within normal limits will improve Outcome: Progressing Goal: Will remain free from infection Outcome: Progressing Goal: Diagnostic test results will improve Outcome: Progressing Goal: Respiratory complications will improve Outcome: Progressing Goal: Cardiovascular complication will be avoided Outcome: Progressing   Problem: Activity: Goal: Risk for activity intolerance will decrease Outcome: Progressing   Problem: Nutrition: Goal: Adequate nutrition will be maintained Outcome: Progressing   Problem: Coping: Goal: Level of anxiety will decrease Outcome: Progressing   Problem: Elimination: Goal: Will not experience complications related to bowel motility Outcome: Progressing Goal: Will not experience complications related to urinary retention Outcome: Progressing   Problem: Pain Management: Goal: General experience of comfort will improve Outcome: Progressing   Problem: Safety: Goal: Ability to remain free from injury will improve Outcome: Progressing   Problem: Skin Integrity: Goal: Risk for impaired skin integrity will decrease Outcome: Progressing

## 2023-04-08 ENCOUNTER — Telehealth: Payer: Self-pay

## 2023-04-08 ENCOUNTER — Encounter (HOSPITAL_COMMUNITY): Payer: Self-pay | Admitting: Gastroenterology

## 2023-04-08 DIAGNOSIS — D509 Iron deficiency anemia, unspecified: Secondary | ICD-10-CM

## 2023-04-08 NOTE — Telephone Encounter (Signed)
MyChart message to patient.  Scheduled for an appointment in Feb with Sinda Du.  [facility] for this Friday entered.

## 2023-04-08 NOTE — Telephone Encounter (Signed)
-----   Message from Benancio Deeds sent at 04/07/2023 12:59 PM EST ----- Regarding: outpatient follow up - labs and office visit Hi Jan,  Can you coordinate CBC for this patient to be done next week (Friday), and send her Mychart message to remind her? Otherwise she will need an office visit with me or APP in 3 months or so.  Thanks! Dr. Mervyn Skeeters

## 2023-04-10 LAB — SURGICAL PATHOLOGY

## 2023-04-12 ENCOUNTER — Other Ambulatory Visit (INDEPENDENT_AMBULATORY_CARE_PROVIDER_SITE_OTHER): Payer: Managed Care, Other (non HMO)

## 2023-04-12 DIAGNOSIS — D509 Iron deficiency anemia, unspecified: Secondary | ICD-10-CM

## 2023-04-12 LAB — CBC WITH DIFFERENTIAL/PLATELET
Basophils Absolute: 0.1 10*3/uL (ref 0.0–0.1)
Basophils Relative: 1 % (ref 0.0–3.0)
Eosinophils Absolute: 0.2 10*3/uL (ref 0.0–0.7)
Eosinophils Relative: 3.5 % (ref 0.0–5.0)
HCT: 31.2 % — ABNORMAL LOW (ref 36.0–46.0)
Hemoglobin: 10.2 g/dL — ABNORMAL LOW (ref 12.0–15.0)
Lymphocytes Relative: 28.9 % (ref 12.0–46.0)
Lymphs Abs: 1.9 10*3/uL (ref 0.7–4.0)
MCHC: 32.7 g/dL (ref 30.0–36.0)
MCV: 82 fL (ref 78.0–100.0)
Monocytes Absolute: 0.6 10*3/uL (ref 0.1–1.0)
Monocytes Relative: 8.3 % (ref 3.0–12.0)
Neutro Abs: 3.9 10*3/uL (ref 1.4–7.7)
Neutrophils Relative %: 58.3 % (ref 43.0–77.0)
Platelets: 311 10*3/uL (ref 150.0–400.0)
RBC: 3.8 Mil/uL — ABNORMAL LOW (ref 3.87–5.11)
RDW: 17.1 % — ABNORMAL HIGH (ref 11.5–15.5)
WBC: 6.7 10*3/uL (ref 4.0–10.5)

## 2023-04-15 ENCOUNTER — Other Ambulatory Visit: Payer: Self-pay

## 2023-04-15 DIAGNOSIS — D509 Iron deficiency anemia, unspecified: Secondary | ICD-10-CM

## 2023-04-16 NOTE — Progress Notes (Signed)
Cardiology Office Note:  .   Date:  04/16/2023  ID:  Kayla Schmitt, DOB 10-27-1965, MRN [id number] PCP: Medicine, [facility]  [facility] Providers Cardiologist: Nanetta Batty, MD }   History of Present Illness: .   Kayla Schmitt is a 57 y.o. female with a hx of NSTEMI 07/2022 w/ severe 3v dz and mod AS >> TCTS rec med rx due to ongoing PAD issues. S/p R SFA mechanical orbital atherectomy and balloon angioplasty 2021. Right SFA stent placement. Right above-knee popliteal artery angioplasty, bilateral transmetatarsal amputation 2024, DM2, HTN, HLD.  She was admitted between 04/03/2023- 04/07/2023 for decompensated combined systolic and diastolic heart failure, hypertension, with iron deficiency anemia.  The patient was diuresed with IV Lasix repeat echocardiogram revealed an EF of 45% to 50%, with moderate MR and AAS.   She was treated conservatively with guideline directed medical therapy although this was limited by acute kidney injury.  Due to significant anemia she was transfused 3 units of packed red blood cells and given IV iron.  Creatinine on discharge elevated from 1.3-1.9.   She comes today feeling very well.  She has more energy, her amputations bilateral of her toes are healing more quickly.  She is not having any issues with lower extremity edema, dyspnea on exertion, weight gain, or palpitations.  She states that she feels great and that she continues to do work, and do wheelchair exercises.  She has been medically compliant.  Offers no complaints of bleeding or bruising on Plavix.   ROS: As above otherwise negative  Studies Reviewed: .      Echocardiogram  04/04/2023  1. Left ventricular ejection fraction, by estimation, is 40 to 45%. The  left ventricle has mildly decreased function. The left ventricle has no  regional wall motion abnormalities. Left ventricular diastolic parameters  are indeterminate.   2. Right ventricular systolic function is  normal. The right ventricular  size is normal.   3. The mitral valve is degenerative. Moderate mitral valve regurgitation.  Mild mitral stenosis.   4. AS is mild to moderate by Doppler. Visually appears at least moderate.  The aortic valve is tricuspid. There is moderate calcification of the  aortic valve. Aortic valve regurgitation is mild. Mild to moderate aortic  valve stenosis.   5. The inferior vena cava is normal in size with <50% respiratory  variability, suggesting right atrial pressure of 8 mmHg.   Physical Exam:   VS:  There were no vitals taken for this visit.   Wt Readings from Last 3 Encounters:  04/07/23 195 lb 12.3 oz (88.8 kg)  03/26/23 191 lb 9.6 oz (86.9 kg)  01/01/23 200 lb 9.6 oz (91 kg)    GEN: Well nourished, well developed in no acute distress NECK: No JVD; No carotid bruits CARDIAC: RRR, 2/6 high pitched systolic murmur, heard at the LSB, and 2/6 systolic murmur at the RSB, there is radiation to the carotids, bilaterally, no rubs, gallops RESPIRATORY:  Clear to auscultation without rales, wheezing or rhonchi  ABDOMEN: Soft, non-tender, non-distended EXTREMITIES:  No edema; No deformity has bilateral dressings to the feet.  Status post removal of 10 toes.  ASSESSMENT AND PLAN: .    Chronic systolic CHF: Most recent echocardiogram EF of 45%.  She has no evidence of volume overload.  Both lisinopril and amlodipine were discontinued during recent hospitalization.  She continues on furosemide 40 mg twice daily, metoprolol 12.5 mg daily,.  Weight has been stable.   2.  Anemia: Status post hospitalization receiving 3 units of packed red blood cells and IV iron.  She is now getting iron supplements.  This will help significantly with volume overload.  The patient is with without any dizziness or bleeding issues currently.  She is being followed by Dr. Adela Lank, GI, approximately every 3 weeks with CBC being drawn.  Continue Plavix.  3.  Peripheral arterial disease:  History of right FSA stent, right above-the-knee popliteal artery angioplasty, and bilateral transmetatarsal amputation.  She is requesting to be followed by Dr. Allyson Sabal as her primary cardiologist.  She is healing well from her recent surgery without any pain or infection.  4.  Moderate to severe aortic valve stenosis: Has been seen by CVTS and was recommended for medical treatment due to PAD issues.  This can be revisited once she is fully healed from her transmetatarsal amputations.  She will need to have follow-up echocardiogram in 6 months for ongoing surveillance.  5.  Mitral valve disease: Degenerative mitral valve with moderate mitral valve regurgitation and mild mitral valve stenosis.  Again conservative management until she is fully healed from PAD surgery.  The patient should have follow-up echocardiogram in 6 months unless she becomes symptomatic.         Signed, Bettey Mare. Liborio Nixon, ANP, AACC

## 2023-04-23 ENCOUNTER — Ambulatory Visit: Payer: Managed Care, Other (non HMO) | Attending: Adult Health | Admitting: Adult Health

## 2023-04-23 ENCOUNTER — Encounter: Payer: Self-pay | Admitting: Adult Health

## 2023-04-23 VITALS — BP 108/56 | HR 57 | Ht 64.0 in | Wt 197.4 lb

## 2023-04-23 DIAGNOSIS — I5022 Chronic systolic (congestive) heart failure: Secondary | ICD-10-CM

## 2023-04-23 DIAGNOSIS — I340 Nonrheumatic mitral (valve) insufficiency: Secondary | ICD-10-CM | POA: Diagnosis not present

## 2023-04-23 DIAGNOSIS — I739 Peripheral vascular disease, unspecified: Secondary | ICD-10-CM | POA: Diagnosis not present

## 2023-04-23 DIAGNOSIS — I350 Nonrheumatic aortic (valve) stenosis: Secondary | ICD-10-CM

## 2023-04-23 NOTE — Patient Instructions (Addendum)
Medication Instructions:  NO CHANGES   Lab Work: NONE   Testing/Procedures: NONE   Follow-Up: At [facility], you and your health needs are our priority.  As part of our continuing mission to provide you with exceptional heart care, we have created designated Provider Care Teams.  These Care Teams include your primary Cardiologist (physician) and Advanced Practice Providers (APPs -  Physician Assistants and Nurse Practitioners) who all work together to provide you with the care you need, when you need it.  We recommend signing up for the patient portal called "MyChart".  Sign up information is provided on this After Visit Summary.  MyChart is used to connect with patients for Virtual Visits (Telemedicine).  Patients are able to view lab/test results, encounter notes, upcoming appointments, etc.  Non-urgent messages can be sent to your provider as well.   To learn more about what you can do with MyChart, go to [email or web address].    Your next appointment:   6 month(s)  Provider:   DR. Allyson Sabal

## 2023-04-30 ENCOUNTER — Ambulatory Visit (INDEPENDENT_AMBULATORY_CARE_PROVIDER_SITE_OTHER): Payer: Managed Care, Other (non HMO) | Admitting: Orthopedic Surgery

## 2023-04-30 DIAGNOSIS — Z89432 Acquired absence of left foot: Secondary | ICD-10-CM

## 2023-04-30 DIAGNOSIS — T8781 Dehiscence of amputation stump: Secondary | ICD-10-CM | POA: Diagnosis not present

## 2023-04-30 DIAGNOSIS — Z89439 Acquired absence of unspecified foot: Secondary | ICD-10-CM

## 2023-05-06 ENCOUNTER — Encounter: Payer: Self-pay | Admitting: Orthopedic Surgery

## 2023-05-06 NOTE — Progress Notes (Signed)
 Office Visit Note   Patient: Kayla Schmitt           Date of Birth: [birth year]           MRN: [id number] Visit Date: 04/30/2023              Requested by: Medicine, [facility] [address] [place],  [place] [address] PCP: [name], NP  Chief Complaint  Patient presents with   Right Foot - Follow-up    11/30/2022 revision right TMA       HPI: Patient is 4 months status post right transmetatarsal amputation.  She is currently in an extra-large stump shrinker.  She is using compression elevation and exercise.  Patient states she does have a low hemoglobin with no GI bleed.  Discussed that this is most likely due to her renal failure secondary to diabetes.  Assessment & Plan: Visit Diagnoses:  1. History of transmetatarsal amputation of foot (HCC)   2. Dehiscence of amputation stump of right lower extremity (HCC)     Plan: Patient will increase her activities as tolerated recommended wearing the compression sock daily.  Follow-Up Instructions: Return if symptoms worsen or fail to improve.   Ortho Exam  Patient is alert, oriented, no adenopathy, well-dressed, normal affect, normal respiratory effort. Examination of the transmetatarsal amputation of the incision is well-healed there is no cellulitis there is persistent venous stasis swelling but no ulcers.  Imaging: No results found. No images are attached to the encounter.  Labs: Lab Results  Component Value Date   HGBA1C 6.4 (H) 08/02/2022   HGBA1C 11.0 (H) 01/11/2020   ESRSEDRATE 136 (H) 01/14/2020   ESRSEDRATE 126 (H) 01/13/2020   ESRSEDRATE 125 (H) 01/12/2020   CRP 11.3 (H) 01/14/2020   CRP 13.0 (H) 01/13/2020   CRP 15.8 (H) 01/12/2020   LABURIC 7.7 (H) 02/04/2022   REPTSTATUS 08/07/2022 FINAL 08/02/2022   CULT  08/02/2022    NO GROWTH 5 DAYS Performed at [facility], [address] [place], [place] [address]      Lab Results  Component Value Date   ALBUMIN  2.1 (L) 04/04/2023    ALBUMIN  2.4 (L) 04/03/2023   ALBUMIN  2.6 (L) 03/22/2023    Lab Results  Component Value Date   MG 1.9 08/25/2022   MG 1.7 08/24/2022   MG 1.7 08/23/2022   No results found for: VD25OH  No results found for: PREALBUMIN    Latest Ref Rng & Units 04/12/2023    4:16 PM 04/07/2023    2:15 AM 04/06/2023    2:07 AM  CBC EXTENDED  WBC 4.0 - 10.5 K/uL 6.7  6.3  6.4   RBC 3.87 - 5.11 Mil/uL 3.80  3.55  3.97   Hemoglobin 12.0 - 15.0 g/dL 89.7  9.2  89.5   HCT 63.9 - 46.0 % 31.2  29.5  33.0   Platelets 150.0 - 400.0 K/uL 311.0  301  334   NEUT# 1.4 - 7.7 K/uL 3.9     Lymph# 0.7 - 4.0 K/uL 1.9        There is no height or weight on file to calculate BMI.  Orders:  No orders of the defined types were placed in this encounter.  No orders of the defined types were placed in this encounter.    Procedures: No procedures performed  Clinical Data: No additional findings.  ROS:  All other systems negative, except as noted in the HPI. Review of Systems  Objective: Vital  Signs: There were no vitals taken for this visit.  Specialty Comments:  No specialty comments available.  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/07/2023   Antiplatelet or antithrombotic long-term use 04/04/2023   Iron  deficiency anemia 04/04/2023   Acute on chronic combined systolic (congestive) and diastolic (congestive) heart failure (HCC) 04/03/2023   Osteomyelitis of foot, right, acute (HCC) 12/12/2022   CAD (coronary artery disease) 11/06/2022   Aortic stenosis 11/06/2022   Mitral regurgitation 08/27/2022   NSTEMI (non-ST elevated myocardial infarction) (HCC) 08/18/2022   Acute clinical systolic heart failure (HCC) 08/18/2022   Acute blood loss anemia 08/18/2022   Symptomatic anemia 08/18/2022   Critical limb ischemia of both lower extremities (HCC) 06/19/2022   DM type 2 (diabetes mellitus, type 2) (HCC)  01/28/2020   Hyperlipidemia 01/28/2020   Hypertension 01/28/2020   Hypothyroidism 01/28/2020   Obesity 01/28/2020   Ulcerated, foot, right, limited to breakdown of skin (HCC)    Cellulitis of right foot    Peripheral arterial disease (HCC)    Cellulitis 01/11/2020   Blister of leg 01/06/2020   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/28/2017   Chest pain 10/28/2017   GERD (gastroesophageal reflux disease) 10/28/2017   Past Medical History:  Diagnosis Date   Anemia    Aortic stenosis    Arthritis    CHF (congestive heart failure) (HCC)    Coronary artery disease    Diabetes mellitus without complication (HCC)    type 2   GERD (gastroesophageal reflux disease)    Heart murmur    History of blood transfusion    Hypercholesteremia    Hypertension    Hypothyroidism    Mitral regurgitation    Myocardial infarction (HCC)    Peripheral vascular disease (HCC)    PONV (postoperative nausea and vomiting)    Thyroid  disease     Family History  Problem Relation Age of Onset   Diabetes Other    CAD Other     Past Surgical History:  Procedure Laterality Date   ABDOMINAL AORTOGRAM W/LOWER EXTREMITY Bilateral 01/14/2020   Procedure: ABDOMINAL AORTOGRAM W/LOWER EXTREMITY;  Surgeon: Gretta Lonni PARAS, MD;  Location: [facility];  Service: Cardiovascular;  Laterality: Bilateral;   ABDOMINAL AORTOGRAM W/LOWER EXTREMITY N/A 07/05/2022   Procedure: ABDOMINAL AORTOGRAM W/LOWER EXTREMITY;  Surgeon: Gretta Lonni PARAS, MD;  Location: [facility];  Service: Cardiovascular;  Laterality: N/A;   ABDOMINAL AORTOGRAM W/LOWER EXTREMITY N/A 08/02/2022   Procedure: ABDOMINAL AORTOGRAM W/LOWER EXTREMITY;  Surgeon: Gretta Lonni PARAS, MD;  Location: [facility];  Service: Cardiovascular;  Laterality: N/A;   ABDOMINAL AORTOGRAM W/LOWER EXTREMITY N/A 08/06/2022   Procedure: ABDOMINAL AORTOGRAM W/LOWER EXTREMITY;  Surgeon: Gretta Lonni PARAS, MD;  Location: [facility];   Service: Cardiovascular;  Laterality: N/A;   AMPUTATION Bilateral 07/20/2022   Procedure: AMPUTATION RIGHT GREAT TOE AND SECOND TOE, AMPUTATION LEFT GREAT TOE;  Surgeon: Harden Jerona GAILS, MD;  Location: [facility] OR;  Service: Orthopedics;  Laterality: Bilateral;   AMPUTATION Bilateral 08/08/2022   Procedure: BILATERAL TRANSMETATARSAL AMPUTATION;  Surgeon: Harden Jerona GAILS, MD;  Location: [facility] OR;  Service: Orthopedics;  Laterality: Bilateral;   AMPUTATION TOE     BIOPSY  04/06/2023   Procedure: BIOPSY;  Surgeon: Akosua Elspeth SQUIBB, MD;  Location: [facility] ENDOSCOPY;  Service: Gastroenterology;;   COLONOSCOPY WITH PROPOFOL  N/A 04/06/2023   Procedure: COLONOSCOPY WITH PROPOFOL ;  Surgeon: Quinita Elspeth  P, MD;  Location: [facility] ENDOSCOPY;  Service: Gastroenterology;  Laterality: N/A;   ESOPHAGOGASTRODUODENOSCOPY (EGD) WITH PROPOFOL  N/A 04/06/2023   Procedure: ESOPHAGOGASTRODUODENOSCOPY (EGD) WITH PROPOFOL ;  Surgeon: Portia Elspeth SQUIBB, MD;  Location: [facility] ENDOSCOPY;  Service: Gastroenterology;  Laterality: N/A;   GIVENS CAPSULE STUDY N/A 04/06/2023   Procedure: GIVENS CAPSULE STUDY;  Surgeon: Lawan Elspeth SQUIBB, MD;  Location: [facility] ENDOSCOPY;  Service: Gastroenterology;  Laterality: N/A;   PERIPHERAL VASCULAR ATHERECTOMY Right 01/14/2020   Procedure: PERIPHERAL VASCULAR ATHERECTOMY;  Surgeon: Gretta Lonni PARAS, MD;  Location: [facility] INVASIVE CV LAB;  Service: Cardiovascular;  Laterality: Right;  sfa   PERIPHERAL VASCULAR INTERVENTION Right 01/14/2020   Procedure: PERIPHERAL VASCULAR INTERVENTION;  Surgeon: Gretta Lonni PARAS, MD;  Location: [facility];  Service: Cardiovascular;  Laterality: Right;  sfa stent    PERIPHERAL VASCULAR INTERVENTION  07/05/2022   Procedure: PERIPHERAL VASCULAR INTERVENTION;  Surgeon: Gretta Lonni PARAS, MD;  Location: [facility];  Service: Cardiovascular;;   PERIPHERAL VASCULAR INTERVENTION  08/02/2022   Procedure: PERIPHERAL VASCULAR INTERVENTION;  Surgeon: Gretta Lonni PARAS,  MD;  Location: [facility];  Service: Cardiovascular;;   PERIPHERAL VASCULAR INTERVENTION  08/06/2022   Procedure: PERIPHERAL VASCULAR INTERVENTION;  Surgeon: Gretta Lonni PARAS, MD;  Location: [facility] INVASIVE CV LAB;  Service: Cardiovascular;;   PERIPHERAL VASCULAR THROMBECTOMY  08/02/2022   Procedure: PERIPHERAL VASCULAR THROMBECTOMY;  Surgeon: Gretta Lonni PARAS, MD;  Location: [facility];  Service: Cardiovascular;;   RIGHT/LEFT HEART CATH AND CORONARY ANGIOGRAPHY N/A 08/23/2022   Procedure: RIGHT/LEFT HEART CATH AND CORONARY ANGIOGRAPHY;  Surgeon: Dann Candyce RAMAN, MD;  Location: [facility] INVASIVE CV LAB;  Service: Cardiovascular;  Laterality: N/A;   SKIN SPLIT GRAFT Right 03/18/2020   Procedure: SKIN GRAFTING RIGHT FOOT ULCER;  Surgeon: Harden Jerona GAILS, MD;  Location: [facility] OR;  Service: Orthopedics;  Laterality: Right;   STUMP REVISION Right 11/30/2022   Procedure: REVISION RIGHT TRANSMETATARSAL AMPUTATION;  Surgeon: Harden Jerona GAILS, MD;  Location: [facility] OR;  Service: Orthopedics;  Laterality: Right;   TEE WITHOUT CARDIOVERSION N/A 08/27/2022   Procedure: TRANSESOPHAGEAL ECHOCARDIOGRAM;  Surgeon: Hobart Powell BRAVO, MD;  Location: [facility] INVASIVE CV LAB;  Service: Cardiovascular;  Laterality: N/A;   WISDOM TOOTH EXTRACTION     Social History   Occupational History   Not on file  Tobacco Use   Smoking status: Former    Current packs/day: 0.00    Types: Cigarettes    Quit date: 01/2017    Years since quitting: 6.2   Smokeless tobacco: Never  Vaping Use   Vaping status: Never Used  Substance and Sexual Activity   Alcohol use: Not Currently   Drug use: Yes    Types: Marijuana    Comment: on ocassion- Last time - 11/13/22   Sexual activity: Yes    Birth control/protection: Post-menopausal

## 2023-05-22 ENCOUNTER — Other Ambulatory Visit (INDEPENDENT_AMBULATORY_CARE_PROVIDER_SITE_OTHER): Payer: Managed Care, Other (non HMO)

## 2023-05-22 DIAGNOSIS — D509 Iron deficiency anemia, unspecified: Secondary | ICD-10-CM

## 2023-05-22 LAB — CBC WITH DIFFERENTIAL/PLATELET
Basophils Absolute: 0 10*3/uL (ref 0.0–0.1)
Basophils Relative: 0.8 % (ref 0.0–3.0)
Eosinophils Absolute: 0.2 10*3/uL (ref 0.0–0.7)
Eosinophils Relative: 3.8 % (ref 0.0–5.0)
HCT: 33 % — ABNORMAL LOW (ref 36.0–46.0)
Hemoglobin: 10.7 g/dL — ABNORMAL LOW (ref 12.0–15.0)
Lymphocytes Relative: 50.9 % — ABNORMAL HIGH (ref 12.0–46.0)
Lymphs Abs: 3.2 10*3/uL (ref 0.7–4.0)
MCHC: 32.3 g/dL (ref 30.0–36.0)
MCV: 85.9 fL (ref 78.0–100.0)
Monocytes Absolute: 0.6 10*3/uL (ref 0.1–1.0)
Monocytes Relative: 9.1 % (ref 3.0–12.0)
Neutro Abs: 2.3 10*3/uL (ref 1.4–7.7)
Neutrophils Relative %: 35.4 % — ABNORMAL LOW (ref 43.0–77.0)
Platelets: 257 10*3/uL (ref 150.0–400.0)
RBC: 3.84 Mil/uL — ABNORMAL LOW (ref 3.87–5.11)
RDW: 20.1 % — ABNORMAL HIGH (ref 11.5–15.5)
WBC: 6.4 10*3/uL (ref 4.0–10.5)

## 2023-05-23 ENCOUNTER — Encounter: Payer: Self-pay | Admitting: Gastroenterology

## 2023-06-21 ENCOUNTER — Ambulatory Visit: Payer: Managed Care, Other (non HMO) | Admitting: Physician Assistant

## 2023-07-04 ENCOUNTER — Encounter: Payer: Self-pay | Admitting: Orthopedic Surgery

## 2023-07-04 ENCOUNTER — Ambulatory Visit: Payer: Managed Care, Other (non HMO) | Admitting: Physician Assistant

## 2023-07-04 ENCOUNTER — Encounter: Payer: Self-pay | Admitting: Physician Assistant

## 2023-07-04 ENCOUNTER — Ambulatory Visit: Payer: Managed Care, Other (non HMO) | Admitting: Orthopedic Surgery

## 2023-07-04 ENCOUNTER — Telehealth: Payer: Self-pay

## 2023-07-04 ENCOUNTER — Other Ambulatory Visit

## 2023-07-04 VITALS — BP 124/70 | HR 64 | Ht 64.0 in | Wt 206.0 lb

## 2023-07-04 DIAGNOSIS — D509 Iron deficiency anemia, unspecified: Secondary | ICD-10-CM | POA: Diagnosis not present

## 2023-07-04 DIAGNOSIS — I739 Peripheral vascular disease, unspecified: Secondary | ICD-10-CM | POA: Diagnosis not present

## 2023-07-04 DIAGNOSIS — Z860100 Personal history of colon polyps, unspecified: Secondary | ICD-10-CM | POA: Diagnosis not present

## 2023-07-04 DIAGNOSIS — Z7901 Long term (current) use of anticoagulants: Secondary | ICD-10-CM

## 2023-07-04 DIAGNOSIS — Z89439 Acquired absence of unspecified foot: Secondary | ICD-10-CM | POA: Diagnosis not present

## 2023-07-04 DIAGNOSIS — I350 Nonrheumatic aortic (valve) stenosis: Secondary | ICD-10-CM

## 2023-07-04 LAB — CBC WITH DIFFERENTIAL/PLATELET
Basophils Absolute: 0.1 10*3/uL (ref 0.0–0.1)
Basophils Relative: 1.2 % (ref 0.0–3.0)
Eosinophils Absolute: 0.3 10*3/uL (ref 0.0–0.7)
Eosinophils Relative: 3.7 % (ref 0.0–5.0)
HCT: 34.4 % — ABNORMAL LOW (ref 36.0–46.0)
Hemoglobin: 11.7 g/dL — ABNORMAL LOW (ref 12.0–15.0)
Lymphocytes Relative: 40.9 % (ref 12.0–46.0)
Lymphs Abs: 3.2 10*3/uL (ref 0.7–4.0)
MCHC: 33.8 g/dL (ref 30.0–36.0)
MCV: 87.8 fl (ref 78.0–100.0)
Monocytes Absolute: 0.6 10*3/uL (ref 0.1–1.0)
Monocytes Relative: 7.6 % (ref 3.0–12.0)
Neutro Abs: 3.6 10*3/uL (ref 1.4–7.7)
Neutrophils Relative %: 46.6 % (ref 43.0–77.0)
Platelets: 268 10*3/uL (ref 150.0–400.0)
RBC: 3.92 Mil/uL (ref 3.87–5.11)
RDW: 16.5 % — ABNORMAL HIGH (ref 11.5–15.5)
WBC: 7.8 10*3/uL (ref 4.0–10.5)

## 2023-07-04 LAB — IBC + FERRITIN
Ferritin: 195.2 ng/mL (ref 10.0–291.0)
Iron: 68 ug/dL (ref 42–145)
Saturation Ratios: 27.8 % (ref 20.0–50.0)
TIBC: 245 ug/dL — ABNORMAL LOW (ref 250.0–450.0)
Transferrin: 175 mg/dL — ABNORMAL LOW (ref 212.0–360.0)

## 2023-07-04 NOTE — Progress Notes (Signed)
 Office Visit Note   Patient: Kayla Schmitt           Date of Birth: 11/05/1965           MRN: [id number] Visit Date: 07/04/2023              Requested by: Orlene Plum, NP [address] [place],  [place] [address] PCP: Orlene Plum, NP  Chief Complaint  Patient presents with   Right Foot - Follow-up    11/30/2022 revision right TMA      HPI: Patient is a 58 year old woman who is seen in follow-up for the initial part amputation right foot.  Patient states that her foot is curving over.  Assessment & Plan: Visit Diagnoses:  1. History of transmetatarsal amputation of foot (HCC)     Plan: Calluses were pared no open wounds no signs of infection.  Patient was provided prescription for physical therapy for balance and strength.  Follow-Up Instructions: Return if symptoms worsen or fail to improve.   Ortho Exam  Patient is alert, oriented, no adenopathy, well-dressed, normal affect, normal respiratory effort. Examination patient has developed exam hindfoot varus secondary to tendon overpull medially.  Her foot does correct to neutral.  She does have 2 calluses that were pared no signs of any deep open wounds.  Discussed the possibility of using a brace patient states she would like to continue with her current shoe wear and cane.  Imaging: No results found. No images are attached to the encounter.  Labs: Lab Results  Component Value Date   HGBA1C 6.4 (H) 08/02/2022   HGBA1C 11.0 (H) 01/11/2020   ESRSEDRATE 136 (H) 01/14/2020   ESRSEDRATE 126 (H) 01/13/2020   ESRSEDRATE 125 (H) 01/12/2020   CRP 11.3 (H) 01/14/2020   CRP 13.0 (H) 01/13/2020   CRP 15.8 (H) 01/12/2020   LABURIC 7.7 (H) 02/04/2022   REPTSTATUS 08/07/2022 FINAL 08/02/2022   CULT  08/02/2022    NO GROWTH 5 DAYS Performed at [facility], [address] [place], [place] [address]      Lab Results  Component Value Date   ALBUMIN 2.1 (L) 04/04/2023   ALBUMIN 2.4 (L) 04/03/2023    ALBUMIN 2.6 (L) 03/22/2023    Lab Results  Component Value Date   MG 1.9 08/25/2022   MG 1.7 08/24/2022   MG 1.7 08/23/2022   No results found for: "VD25OH"  No results found for: "PREALBUMIN"    Latest Ref Rng & Units 05/22/2023    4:06 PM 04/12/2023    4:16 PM 04/07/2023    2:15 AM  CBC EXTENDED  WBC 4.0 - 10.5 K/uL 6.4  6.7  6.3   RBC 3.87 - 5.11 Mil/uL 3.84  3.80  3.55   Hemoglobin 12.0 - 15.0 g/dL 13.0  86.5  9.2   HCT 78.4 - 46.0 % 33.0  31.2  29.5   Platelets 150.0 - 400.0 K/uL 257.0  311.0  301   NEUT# 1.4 - 7.7 K/uL 2.3  3.9    Lymph# 0.7 - 4.0 K/uL 3.2  1.9       There is no height or weight on file to calculate BMI.  Orders:  No orders of the defined types were placed in this encounter.  No orders of the defined types were placed in this encounter.    Procedures: No procedures performed  Clinical Data: No additional findings.  ROS:  All other systems negative, except as noted in the HPI. Review  of Systems  Objective: Vital Signs: There were no vitals taken for this visit.  Specialty Comments:  No specialty comments available.  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/07/2023   Antiplatelet or antithrombotic long-term use 04/04/2023   Iron deficiency anemia 04/04/2023   Acute on chronic combined systolic (congestive) and diastolic (congestive) heart failure (HCC) 04/03/2023   Osteomyelitis of foot, right, acute (HCC) 12/12/2022   CAD (coronary artery disease) 11/06/2022   Aortic stenosis 11/06/2022   Mitral regurgitation 08/27/2022   NSTEMI (non-ST elevated myocardial infarction) (HCC) 08/18/2022   Acute clinical systolic heart failure (HCC) 08/18/2022   Acute blood loss anemia 08/18/2022   Symptomatic anemia 08/18/2022   Critical limb ischemia of both lower extremities (HCC) 06/19/2022   DM type 2 (diabetes mellitus, type 2) (HCC) 01/28/2020    Hyperlipidemia 01/28/2020   Hypertension 01/28/2020   Hypothyroidism 01/28/2020   Obesity 01/28/2020   Ulcerated, foot, right, limited to breakdown of skin (HCC)    Cellulitis of right foot    Peripheral arterial disease (HCC)    Cellulitis 01/11/2020   Blister of leg 01/06/2020   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/28/2017   Chest pain 10/28/2017   GERD (gastroesophageal reflux disease) 10/28/2017   Past Medical History:  Diagnosis Date   Anemia    Aortic stenosis    Arthritis    CHF (congestive heart failure) (HCC)    Coronary artery disease    Diabetes mellitus without complication (HCC)    type 2   GERD (gastroesophageal reflux disease)    Heart murmur    History of blood transfusion    Hypercholesteremia    Hypertension    Hypothyroidism    Mitral regurgitation    Myocardial infarction (HCC)    Peripheral vascular disease (HCC)    PONV (postoperative nausea and vomiting)    Thyroid disease     Family History  Problem Relation Age of Onset   Diabetes Other    CAD Other     Past Surgical History:  Procedure Laterality Date   ABDOMINAL AORTOGRAM W/LOWER EXTREMITY Bilateral 01/14/2020   Procedure: ABDOMINAL AORTOGRAM W/LOWER EXTREMITY;  Surgeon: Cephus Shelling, MD;  Location: [facility];  Service: Cardiovascular;  Laterality: Bilateral;   ABDOMINAL AORTOGRAM W/LOWER EXTREMITY N/A 07/05/2022   Procedure: ABDOMINAL AORTOGRAM W/LOWER EXTREMITY;  Surgeon: Cephus Shelling, MD;  Location: [facility];  Service: Cardiovascular;  Laterality: N/A;   ABDOMINAL AORTOGRAM W/LOWER EXTREMITY N/A 08/02/2022   Procedure: ABDOMINAL AORTOGRAM W/LOWER EXTREMITY;  Surgeon: Cephus Shelling, MD;  Location: [facility];  Service: Cardiovascular;  Laterality: N/A;   ABDOMINAL AORTOGRAM W/LOWER EXTREMITY N/A 08/06/2022   Procedure: ABDOMINAL AORTOGRAM W/LOWER EXTREMITY;  Surgeon: Cephus Shelling, MD;  Location: [facility];  Service:  Cardiovascular;  Laterality: N/A;   AMPUTATION Bilateral 07/20/2022   Procedure: AMPUTATION RIGHT GREAT TOE AND SECOND TOE, AMPUTATION LEFT GREAT TOE;  Surgeon: Nadara Mustard, MD;  Location: [facility] OR;  Service: Orthopedics;  Laterality: Bilateral;   AMPUTATION Bilateral 08/08/2022   Procedure: BILATERAL TRANSMETATARSAL AMPUTATION;  Surgeon: Nadara Mustard, MD;  Location: [facility] OR;  Service: Orthopedics;  Laterality: Bilateral;   AMPUTATION TOE     BIOPSY  04/06/2023   Procedure: BIOPSY;  Surgeon: Benancio Deeds, MD;  Location: [facility] ENDOSCOPY;  Service: Gastroenterology;;   COLONOSCOPY WITH PROPOFOL N/A 04/06/2023   Procedure: COLONOSCOPY WITH  PROPOFOL;  Surgeon: Benancio Deeds, MD;  Location: [facility] ENDOSCOPY;  Service: Gastroenterology;  Laterality: N/A;   ESOPHAGOGASTRODUODENOSCOPY (EGD) WITH PROPOFOL N/A 04/06/2023   Procedure: ESOPHAGOGASTRODUODENOSCOPY (EGD) WITH PROPOFOL;  Surgeon: Benancio Deeds, MD;  Location: [facility] ENDOSCOPY;  Service: Gastroenterology;  Laterality: N/A;   GIVENS CAPSULE STUDY N/A 04/06/2023   Procedure: GIVENS CAPSULE STUDY;  Surgeon: Benancio Deeds, MD;  Location: [facility] ENDOSCOPY;  Service: Gastroenterology;  Laterality: N/A;   PERIPHERAL VASCULAR ATHERECTOMY Right 01/14/2020   Procedure: PERIPHERAL VASCULAR ATHERECTOMY;  Surgeon: Cephus Shelling, MD;  Location: [facility] INVASIVE CV LAB;  Service: Cardiovascular;  Laterality: Right;  sfa   PERIPHERAL VASCULAR INTERVENTION Right 01/14/2020   Procedure: PERIPHERAL VASCULAR INTERVENTION;  Surgeon: Cephus Shelling, MD;  Location: [facility];  Service: Cardiovascular;  Laterality: Right;  sfa stent    PERIPHERAL VASCULAR INTERVENTION  07/05/2022   Procedure: PERIPHERAL VASCULAR INTERVENTION;  Surgeon: Cephus Shelling, MD;  Location: [facility];  Service: Cardiovascular;;   PERIPHERAL VASCULAR INTERVENTION  08/02/2022   Procedure: PERIPHERAL VASCULAR INTERVENTION;  Surgeon: Cephus Shelling, MD;   Location: [facility];  Service: Cardiovascular;;   PERIPHERAL VASCULAR INTERVENTION  08/06/2022   Procedure: PERIPHERAL VASCULAR INTERVENTION;  Surgeon: Cephus Shelling, MD;  Location: [facility] INVASIVE CV LAB;  Service: Cardiovascular;;   PERIPHERAL VASCULAR THROMBECTOMY  08/02/2022   Procedure: PERIPHERAL VASCULAR THROMBECTOMY;  Surgeon: Cephus Shelling, MD;  Location: [facility];  Service: Cardiovascular;;   RIGHT/LEFT HEART CATH AND CORONARY ANGIOGRAPHY N/A 08/23/2022   Procedure: RIGHT/LEFT HEART CATH AND CORONARY ANGIOGRAPHY;  Surgeon: [name], MD;  Location: [facility] INVASIVE CV LAB;  Service: Cardiovascular;  Laterality: N/A;   SKIN SPLIT GRAFT Right 03/18/2020   Procedure: SKIN GRAFTING RIGHT FOOT ULCER;  Surgeon: Nadara Mustard, MD;  Location: [facility] OR;  Service: Orthopedics;  Laterality: Right;   STUMP REVISION Right 11/30/2022   Procedure: REVISION RIGHT TRANSMETATARSAL AMPUTATION;  Surgeon: Nadara Mustard, MD;  Location: [facility] OR;  Service: Orthopedics;  Laterality: Right;   TEE WITHOUT CARDIOVERSION N/A 08/27/2022   Procedure: TRANSESOPHAGEAL ECHOCARDIOGRAM;  Surgeon: [name], MD;  Location: [facility] INVASIVE CV LAB;  Service: Cardiovascular;  Laterality: N/A;   WISDOM TOOTH EXTRACTION     Social History   Occupational History   Not on file  Tobacco Use   Smoking status: Former    Current packs/day: 0.00    Types: Cigarettes    Quit date: 01/2017    Years since quitting: 6.4   Smokeless tobacco: Never  Vaping Use   Vaping status: Never Used  Substance and Sexual Activity   Alcohol use: Not Currently   Drug use: Yes    Types: Marijuana    Comment: on ocassion- Last time - 11/13/22   Sexual activity: Yes    Birth control/protection: Post-menopausal

## 2023-07-04 NOTE — Patient Instructions (Signed)
 Your provider has requested that you go to the basement level for lab work before leaving today. Press "B" on the elevator. The lab is located at the first door on the left as you exit the elevator.  Due to recent changes in healthcare laws, you may see the results of your imaging and laboratory studies on MyChart before your provider has had a chance to review them.  We understand that in some cases there may be results that are confusing or concerning to you. Not all laboratory results come back in the same time frame and the provider may be waiting for multiple results in order to interpret others.  Please give [place] 48 hours in order for your provider to thoroughly review all the results before contacting the office for clarification of your results.   Thank you for trusting me with your gastrointestinal care!  Hyacinth Meeker, PA-C   _______________________________________________________  If your blood pressure at your visit was 140/90 or greater, please contact your primary care physician to follow up on this.  _______________________________________________________  If you are age 49 or older, your body mass index should be between 23-30. Your Body mass index is 35.36 kg/m. If this is out of the aforementioned range listed, please consider follow up with your Primary Care Provider.  If you are age 38 or younger, your body mass index should be between 19-25. Your Body mass index is 35.36 kg/m. If this is out of the aformentioned range listed, please consider follow up with your Primary Care Provider.   ________________________________________________________  The [facility] providers would like to encourage you to use [facility] to communicate with providers for non-urgent requests or questions.  Due to long hold times on the telephone, sending your provider a message by [facility] may be a faster and more efficient way to get a response.  Please allow 48 business hours for a response.  Please  remember that this is for non-urgent requests.  _______________________________________________________

## 2023-07-04 NOTE — Telephone Encounter (Signed)
 [facility] Pre-operative Risk Assessment     Request for surgical clearance:     Endoscopy Procedure  What type of surgery is being performed?     Colon   When is this surgery scheduled?     TBD  What type of clearance is required ?   Pharmacy  Are there any medications that need to be held prior to surgery and how long? Plavix 5 days  Practice name and name of physician performing surgery?      [facility]  What is your office phone and fax number?      Phone- [phone]  Fax- [phone]  Anesthesia type (None, local, MAC, general) ?       MAC   Please route your response to [employer], CMA

## 2023-07-04 NOTE — Progress Notes (Signed)
 Chief Complaint: Follow-up hospitalization for iron deficiency anemia  HPI:    Kayla Schmitt is a 58 year old Caucasian female, assigned to Dr. Adela Lank, with a past medical history as listed below including CHF, CAD (10/18/2022 echo with LVEF 50-55% and moderate aortic valve stenosis, V-max 2.9 m/s, AVA 1.2 cm), diabetes, GERD, MI, PVD on Plavix and aspirin and multiple others, who presents to clinic today for follow-up after recent hospitalization for iron deficiency anemia.    04/04/2023 patient consulted by our service for iron deficiency anemia.  Upon initial consultation overt GI bleeding, on Plavix and aspirin.-Trans 1 unit PRBCs 12/4 with a hemoglobin of 6.3.  Was getting a second unit.    04/06/2023 EGD and colonoscopy.  EGD was normal other than benign duodenal lymphangiectasia.  Colonoscopy with perianal skin tags, diverticulosis in the sigmoid colon, one 3-4 mm polyp in the; internal hemorrhoids.  At that point recommended a repeat colonoscopy/flex sig as an outpatient in the upcoming months for polypectomy of sigmoid colon if/when she was stable to hold Plavix.  Also recommended capsule endoscopy to clear the small bowel.    04/06/23 capsule endoscopy was normal.  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    05/22/2023 CBC with a hemoglobin of 10.7 (10.2 on 04/12/2023, 9.2 on 04/07/2023).    Today, the patient presents to clinic accompanied by her mother.  She tells me that she feels great.  She is no longer exhausted and is getting her energy back.  Still on oral iron.    Denies fever, chills or weight loss.  Past Medical History:  Diagnosis Date   Anemia    Aortic stenosis    Arthritis    CHF (congestive heart failure) (HCC)    Coronary artery disease    Diabetes mellitus without complication (HCC)    type 2   GERD (gastroesophageal reflux disease)    Heart murmur     History of blood transfusion    Hypercholesteremia    Hypertension    Hypothyroidism    Mitral regurgitation    Myocardial infarction (HCC)    Peripheral vascular disease (HCC)    PONV (postoperative nausea and vomiting)    Thyroid disease     Past Surgical History:  Procedure Laterality Date   ABDOMINAL AORTOGRAM W/LOWER EXTREMITY Bilateral 01/14/2020   Procedure: ABDOMINAL AORTOGRAM W/LOWER EXTREMITY;  Surgeon: Cephus Shelling, MD;  Location: [facility];  Service: Cardiovascular;  Laterality: Bilateral;   ABDOMINAL AORTOGRAM W/LOWER EXTREMITY N/A 07/05/2022   Procedure: ABDOMINAL AORTOGRAM W/LOWER EXTREMITY;  Surgeon: Cephus Shelling, MD;  Location: [facility];  Service: Cardiovascular;  Laterality: N/A;   ABDOMINAL AORTOGRAM W/LOWER EXTREMITY N/A 08/02/2022   Procedure: ABDOMINAL AORTOGRAM W/LOWER EXTREMITY;  Surgeon: Cephus Shelling, MD;  Location: [facility];  Service: Cardiovascular;  Laterality: N/A;   ABDOMINAL AORTOGRAM W/LOWER EXTREMITY N/A 08/06/2022   Procedure: ABDOMINAL AORTOGRAM W/LOWER EXTREMITY;  Surgeon: Cephus Shelling, MD;  Location: [facility];  Service: Cardiovascular;  Laterality: N/A;   AMPUTATION Bilateral 07/20/2022   Procedure: AMPUTATION RIGHT GREAT TOE AND SECOND TOE, AMPUTATION LEFT GREAT TOE;  Surgeon: Nadara Mustard, MD;  Location: [facility] OR;  Service: Orthopedics;  Laterality: Bilateral;   AMPUTATION Bilateral 08/08/2022   Procedure: BILATERAL TRANSMETATARSAL AMPUTATION;  Surgeon: Nadara Mustard, MD;  Location: [facility] OR;  Service: Orthopedics;  Laterality: Bilateral;   AMPUTATION TOE     BIOPSY  04/06/2023   Procedure: BIOPSY;  Surgeon: Benancio Deeds, MD;  Location: [facility] ENDOSCOPY;  Service: Gastroenterology;;   COLONOSCOPY WITH PROPOFOL N/A 04/06/2023   Procedure: COLONOSCOPY WITH PROPOFOL;  Surgeon: Benancio Deeds, MD;  Location: [facility] ENDOSCOPY;  Service: Gastroenterology;  Laterality: N/A;    ESOPHAGOGASTRODUODENOSCOPY (EGD) WITH PROPOFOL N/A 04/06/2023   Procedure: ESOPHAGOGASTRODUODENOSCOPY (EGD) WITH PROPOFOL;  Surgeon: Benancio Deeds, MD;  Location: [facility] ENDOSCOPY;  Service: Gastroenterology;  Laterality: N/A;   GIVENS CAPSULE STUDY N/A 04/06/2023   Procedure: GIVENS CAPSULE STUDY;  Surgeon: Benancio Deeds, MD;  Location: [facility] ENDOSCOPY;  Service: Gastroenterology;  Laterality: N/A;   PERIPHERAL VASCULAR ATHERECTOMY Right 01/14/2020   Procedure: PERIPHERAL VASCULAR ATHERECTOMY;  Surgeon: Cephus Shelling, MD;  Location: [facility] INVASIVE CV LAB;  Service: Cardiovascular;  Laterality: Right;  sfa   PERIPHERAL VASCULAR INTERVENTION Right 01/14/2020   Procedure: PERIPHERAL VASCULAR INTERVENTION;  Surgeon: Cephus Shelling, MD;  Location: [facility];  Service: Cardiovascular;  Laterality: Right;  sfa stent    PERIPHERAL VASCULAR INTERVENTION  07/05/2022   Procedure: PERIPHERAL VASCULAR INTERVENTION;  Surgeon: Cephus Shelling, MD;  Location: [facility];  Service: Cardiovascular;;   PERIPHERAL VASCULAR INTERVENTION  08/02/2022   Procedure: PERIPHERAL VASCULAR INTERVENTION;  Surgeon: Cephus Shelling, MD;  Location: [facility];  Service: Cardiovascular;;   PERIPHERAL VASCULAR INTERVENTION  08/06/2022   Procedure: PERIPHERAL VASCULAR INTERVENTION;  Surgeon: Cephus Shelling, MD;  Location: [facility] INVASIVE CV LAB;  Service: Cardiovascular;;   PERIPHERAL VASCULAR THROMBECTOMY  08/02/2022   Procedure: PERIPHERAL VASCULAR THROMBECTOMY;  Surgeon: Cephus Shelling, MD;  Location: [facility];  Service: Cardiovascular;;   RIGHT/LEFT HEART CATH AND CORONARY ANGIOGRAPHY N/A 08/23/2022   Procedure: RIGHT/LEFT HEART CATH AND CORONARY ANGIOGRAPHY;  Surgeon: [name], MD;  Location: [facility] INVASIVE CV LAB;  Service: Cardiovascular;  Laterality: N/A;   SKIN SPLIT GRAFT Right 03/18/2020   Procedure: SKIN GRAFTING RIGHT FOOT ULCER;  Surgeon: Nadara Mustard,  MD;  Location: [facility] OR;  Service: Orthopedics;  Laterality: Right;   STUMP REVISION Right 11/30/2022   Procedure: REVISION RIGHT TRANSMETATARSAL AMPUTATION;  Surgeon: Nadara Mustard, MD;  Location: [facility] OR;  Service: Orthopedics;  Laterality: Right;   TEE WITHOUT CARDIOVERSION N/A 08/27/2022   Procedure: TRANSESOPHAGEAL ECHOCARDIOGRAM;  Surgeon: [name], MD;  Location: [facility] INVASIVE CV LAB;  Service: Cardiovascular;  Laterality: N/A;   WISDOM TOOTH EXTRACTION      Current Outpatient Medications  Medication Sig Dispense Refill   acetaminophen (TYLENOL) 500 MG tablet Take 1,000 mg by mouth 2 (two) times daily as needed for moderate pain (pain score 4-6), headache or fever.     Alcohol Swabs (PHARMACIST CHOICE ALCOHOL) PADS Apply topically.     aspirin 81 MG EC tablet Take 81 mg by mouth daily.     atorvastatin (LIPITOR) 40 MG tablet Take 40 mg by mouth daily.     clopidogrel (PLAVIX) 75 MG tablet TAKE 1 TABLET BY MOUTH EVERY DAY WITH BREAKFAST 90 tablet 3   diphenhydrAMINE HCl, Sleep, (ZZZQUIL) 25 MG CAPS Take 25 mg by mouth at bedtime as needed (Sleep).     ferrous sulfate 325 (65 FE) MG EC tablet Take 1 tablet (325 mg total) by mouth daily with breakfast. 60 tablet 2   furosemide (LASIX) 40 MG tablet Take 1 tablet (40 mg total) by mouth 2 (two) times daily.  30 tablet 3   gabapentin (NEURONTIN) 300 MG capsule Take 300 mg by mouth at bedtime.     insulin degludec (TRESIBA FLEXTOUCH) 100 UNIT/ML FlexTouch Pen Inject 10 Units into the skin at bedtime. (Patient taking differently: Inject 16 Units into the skin at bedtime.) 3 mL 3   levothyroxine (SYNTHROID) 150 MCG tablet Take 150 mcg by mouth daily before breakfast.     metoprolol succinate (TOPROL-XL) 25 MG 24 hr tablet Take 0.5 tablets (12.5 mg total) by mouth daily. 30 tablet 1   nitroGLYCERIN (NITROSTAT) 0.4 MG SL tablet Place 1 tablet (0.4 mg total) under the tongue every 5 (five) minutes x 3 doses as needed for chest pain (if no relief  after 3rd dose proceed to ED or call 911). 11/06/2022-New 25 tablet 1   oxyCODONE-acetaminophen (PERCOCET/ROXICET) 5-325 MG tablet Take 1 tablet by mouth every 6 (six) hours as needed for severe pain (pain score 7-10). 15 tablet 0   pantoprazole (PROTONIX) 40 MG tablet Take 40 mg by mouth daily.     potassium chloride (KLOR-CON) 20 MEQ packet Take 40 mEq by mouth daily. 60 packet 1   promethazine (PHENERGAN) 25 MG tablet Take 1 tablet (25 mg total) by mouth every 6 (six) hours as needed for nausea or vomiting. 30 tablet 0   No current facility-administered medications for this visit.    Allergies as of 07/04/2023 - Review Complete 07/04/2023  Allergen Reaction Noted   Trental [pentoxifylline] Nausea And Vomiting 03/18/2020   Vibramycin [doxycycline] Nausea And Vomiting 01/11/2020   Penicillins Rash 01/11/2020    Family History  Problem Relation Age of Onset   Diabetes Other    CAD Other     Social History   Socioeconomic History   Marital status: Divorced    Spouse name: Not on file   Number of children: 0   Years of education: Not on file   Highest education level: Not on file  Occupational History   Not on file  Tobacco Use   Smoking status: Former    Current packs/day: 0.00    Types: Cigarettes    Quit date: 01/2017    Years since quitting: 6.4   Smokeless tobacco: Never  Vaping Use   Vaping status: Never Used  Substance and Sexual Activity   Alcohol use: Not Currently   Drug use: Yes    Types: Marijuana    Comment: on ocassion- Last time - 11/13/22   Sexual activity: Yes    Birth control/protection: Post-menopausal  Other Topics Concern   Not on file  Social History Narrative   Not on file   Social Drivers of Health   Financial Resource Strain: Not on file  Food Insecurity: No Food Insecurity (04/04/2023)   Hunger Vital Sign    Worried About Running Out of Food in the Last Year: Never true    Ran Out of Food in the Last Year: Never true  Transportation  Needs: No Transportation Needs (04/04/2023)   PRAPARE - Administrator, Civil Service (Medical): No    Lack of Transportation (Non-Medical): No  Physical Activity: Not on file  Stress: Not on file  Social Connections: Not on file  Intimate Partner Violence: Not At Risk (04/04/2023)   Humiliation, Afraid, Rape, and Kick questionnaire    Fear of Current or Ex-Partner: No    Emotionally Abused: No    Physically Abused: No    Sexually Abused: No    Review of Systems:    Constitutional:  No weight loss, fever or chills Cardiovascular: No chest pain  Respiratory: No SOB  Gastrointestinal: See HPI and otherwise negative   Physical Exam:  Vital signs: BP 124/70   Pulse 64   Ht 5\' 4"  (1.626 m)   Wt 206 lb (93.4 kg)   BMI 35.36 kg/m    Constitutional:   Pleasant overweight Caucasian female appears to be in NAD, Well developed, Well nourished, alert and cooperative Respiratory: Respirations even and unlabored. Lungs clear to auscultation bilaterally.   No wheezes, crackles, or rhonchi.  Cardiovascular: Normal S1, S2. No MRG. Regular rate and rhythm. No peripheral edema, cyanosis or pallor.  Gastrointestinal:  Soft, nondistended, nontender. No rebound or guarding. Normal bowel sounds. No appreciable masses or hepatomegaly. Rectal:  Not performed.  Psychiatric: Demonstrates good judgement and reason without abnormal affect or behaviors.  RELEVANT LABS AND IMAGING: CBC    Component Value Date/Time   WBC 6.4 05/22/2023 1606   RBC 3.84 (L) 05/22/2023 1606   HGB 10.7 (L) 05/22/2023 1606   HCT 33.0 (L) 05/22/2023 1606   PLT 257.0 05/22/2023 1606   MCV 85.9 05/22/2023 1606   MCH 25.9 (L) 04/07/2023 0215   MCHC 32.3 05/22/2023 1606   RDW 20.1 (H) 05/22/2023 1606   LYMPHSABS 3.2 05/22/2023 1606   MONOABS 0.6 05/22/2023 1606   EOSABS 0.2 05/22/2023 1606   BASOSABS 0.0 05/22/2023 1606    CMP     Component Value Date/Time   NA 137 04/07/2023 0215   K 3.4 (L) 04/07/2023  0215   CL 104 04/07/2023 0215   CO2 24 04/07/2023 0215   GLUCOSE 132 (H) 04/07/2023 0215   BUN 18 04/07/2023 0215   CREATININE 1.30 (H) 04/07/2023 0215   CALCIUM 8.7 (L) 04/07/2023 0215   PROT 5.9 (L) 04/04/2023 0442   ALBUMIN 2.1 (L) 04/04/2023 0442   AST 8 (L) 04/04/2023 0442   ALT 7 04/04/2023 0442   ALKPHOS 88 04/04/2023 0442   BILITOT 0.7 04/04/2023 0442   GFRNONAA 48 (L) 04/07/2023 0215   GFRAA >60 01/15/2020 0836    Assessment: 1.  Iron deficiency anemia: Patient recent hospitalized for iron deficiency anemia, EGD/colonoscopy/pill capsule endoscopy with telangiectasias and otherwise no abnormality, anemia likely from bleeding telangiectasias, improved during hospitalization and hemoglobin holding stable as of January, continues on iron supplementation 2.  Sigmoid polyp: Found during time of colonoscopy in the hospital, recommended a repeat flex sig versus colonoscopy outpatient when she was able to hold her Plavix 3.  PVD on Plavix  Plan: 1.  We will send Dr. Chestine Spore a message, this is who prescribes her Plavix.  If she would be able to hold this for 5 days we can go ahead and schedule her for repeat colonoscopy to remove the sigmoid polyp and survey for any further.  Discussed this with the patient today.  She is in agreement as long as we do Sutab.  She does not want to do the liquid bowel prep.  If we proceed with this patient will need to be talked through the bowel prep and this will need to be scheduled in the [facility]. 2.  Continue iron supplementation for now. 3.  Recheck CBC and iron studies today 4.  Patient to follow in clinic with [place] per recommendations after labs and information above.  Hyacinth Meeker, PA-C [facility] 07/04/2023, 2:44 PM  Cc: Medicine, [facility] Internal

## 2023-07-05 ENCOUNTER — Other Ambulatory Visit: Payer: Self-pay

## 2023-07-05 DIAGNOSIS — D509 Iron deficiency anemia, unspecified: Secondary | ICD-10-CM

## 2023-09-24 ENCOUNTER — Other Ambulatory Visit (INDEPENDENT_AMBULATORY_CARE_PROVIDER_SITE_OTHER)

## 2023-09-24 ENCOUNTER — Ambulatory Visit (HOSPITAL_COMMUNITY)
Admission: RE | Admit: 2023-09-24 | Discharge: 2023-09-24 | Disposition: A | Discharge status: Home / Self Care | Payer: Managed Care, Other (non HMO) | Source: Ambulatory Visit | Attending: Vascular Surgery | Admitting: Vascular Surgery

## 2023-09-24 ENCOUNTER — Encounter: Payer: Self-pay | Admitting: Vascular Surgery

## 2023-09-24 ENCOUNTER — Ambulatory Visit: Payer: Managed Care, Other (non HMO) | Attending: Vascular Surgery | Admitting: Vascular Surgery

## 2023-09-24 ENCOUNTER — Ambulatory Visit (HOSPITAL_BASED_OUTPATIENT_CLINIC_OR_DEPARTMENT_OTHER)
Admission: RE | Admit: 2023-09-24 | Discharge: 2023-09-24 | Disposition: A | Discharge status: Home / Self Care | Payer: Managed Care, Other (non HMO) | Source: Ambulatory Visit | Attending: Vascular Surgery | Admitting: Vascular Surgery

## 2023-09-24 VITALS — BP 153/68 | HR 64 | Temp 98.2°F | Resp 20 | Ht 64.0 in | Wt 207.5 lb

## 2023-09-24 DIAGNOSIS — I70223 Atherosclerosis of native arteries of extremities with rest pain, bilateral legs: Secondary | ICD-10-CM | POA: Diagnosis not present

## 2023-09-24 DIAGNOSIS — I739 Peripheral vascular disease, unspecified: Secondary | ICD-10-CM

## 2023-09-24 DIAGNOSIS — D509 Iron deficiency anemia, unspecified: Secondary | ICD-10-CM

## 2023-09-24 LAB — CBC WITH DIFFERENTIAL/PLATELET
Basophils Absolute: 0.1 10*3/uL (ref 0.0–0.1)
Basophils Relative: 0.7 % (ref 0.0–3.0)
Eosinophils Absolute: 0.2 10*3/uL (ref 0.0–0.7)
Eosinophils Relative: 3.3 % (ref 0.0–5.0)
HCT: 29.6 % — ABNORMAL LOW (ref 36.0–46.0)
Hemoglobin: 10.2 g/dL — ABNORMAL LOW (ref 12.0–15.0)
Lymphocytes Relative: 36.6 % (ref 12.0–46.0)
Lymphs Abs: 2.5 10*3/uL (ref 0.7–4.0)
MCHC: 34.4 g/dL (ref 30.0–36.0)
MCV: 87.1 fl (ref 78.0–100.0)
Monocytes Absolute: 0.6 10*3/uL (ref 0.1–1.0)
Monocytes Relative: 8.6 % (ref 3.0–12.0)
Neutro Abs: 3.5 10*3/uL (ref 1.4–7.7)
Neutrophils Relative %: 50.8 % (ref 43.0–77.0)
Platelets: 324 10*3/uL (ref 150.0–400.0)
RBC: 3.4 Mil/uL — ABNORMAL LOW (ref 3.87–5.11)
RDW: 13.4 % (ref 11.5–15.5)
WBC: 6.8 10*3/uL (ref 4.0–10.5)

## 2023-09-24 LAB — IBC + FERRITIN
Ferritin: 209.5 ng/mL (ref 10.0–291.0)
Iron: 34 ug/dL — ABNORMAL LOW (ref 42–145)
Saturation Ratios: 15.7 % — ABNORMAL LOW (ref 20.0–50.0)
TIBC: 217 ug/dL — ABNORMAL LOW (ref 250.0–450.0)
Transferrin: 155 mg/dL — ABNORMAL LOW (ref 212.0–360.0)

## 2023-09-24 LAB — VAS US ABI WITH/WO TBI
Left ABI: 1.01
Right ABI: 0.77

## 2023-09-24 NOTE — Progress Notes (Signed)
 Patient name: Kayla Schmitt MRN: [id number] DOB: 07-31-1965 Sex: female  REASON FOR VISIT: 6 month follow-up PAD  HPI: Kayla Schmitt is a 58 y.o. female with history of hypertension, hyperlipidemia, diabetes that presents for 6 month follow-up of PAD.  Most recently on 08/02/2022 she underwent left SFA and above-knee popliteal percutaneous thrombectomy with JETI and additional angioplasty and stenting into the above-knee popliteal artery for an occluded distal SFA stent with tissue loss.  She then on 08/06/2022 underwent laser arthrectomy of the right SFA above-knee popliteal with DCB for in-stent stenosis.  She had bilateral TMA's with Dr. Julio Ohm on 08/08/2022.  The TMA's have both healed.  She has no complaints today.  She is at work and walking without issue.  She has started physical therapy to help with her balance.   Past Medical History:  Diagnosis Date   Anemia    Aortic stenosis    Arthritis    CHF (congestive heart failure) (HCC)    Coronary artery disease    Diabetes mellitus without complication (HCC)    type 2   GERD (gastroesophageal reflux disease)    Heart murmur    History of blood transfusion    Hypercholesteremia    Hypertension    Hypothyroidism    Mitral regurgitation    Myocardial infarction (HCC)    Peripheral vascular disease (HCC)    PONV (postoperative nausea and vomiting)    Thyroid disease     Past Surgical History:  Procedure Laterality Date   ABDOMINAL AORTOGRAM W/LOWER EXTREMITY Bilateral 01/14/2020   Procedure: ABDOMINAL AORTOGRAM W/LOWER EXTREMITY;  Surgeon: Young Hensen, MD;  Location: [facility];  Service: Cardiovascular;  Laterality: Bilateral;   ABDOMINAL AORTOGRAM W/LOWER EXTREMITY N/A 07/05/2022   Procedure: ABDOMINAL AORTOGRAM W/LOWER EXTREMITY;  Surgeon: Young Hensen, MD;  Location: [facility];  Service: Cardiovascular;  Laterality: N/A;   ABDOMINAL AORTOGRAM W/LOWER EXTREMITY N/A 08/02/2022    Procedure: ABDOMINAL AORTOGRAM W/LOWER EXTREMITY;  Surgeon: Young Hensen, MD;  Location: [facility];  Service: Cardiovascular;  Laterality: N/A;   ABDOMINAL AORTOGRAM W/LOWER EXTREMITY N/A 08/06/2022   Procedure: ABDOMINAL AORTOGRAM W/LOWER EXTREMITY;  Surgeon: Young Hensen, MD;  Location: [facility];  Service: Cardiovascular;  Laterality: N/A;   AMPUTATION Bilateral 07/20/2022   Procedure: AMPUTATION RIGHT GREAT TOE AND SECOND TOE, AMPUTATION LEFT GREAT TOE;  Surgeon: Timothy Ford, MD;  Location: [facility] OR;  Service: Orthopedics;  Laterality: Bilateral;   AMPUTATION Bilateral 08/08/2022   Procedure: BILATERAL TRANSMETATARSAL AMPUTATION;  Surgeon: Timothy Ford, MD;  Location: [facility] OR;  Service: Orthopedics;  Laterality: Bilateral;   AMPUTATION TOE     BIOPSY  04/06/2023   Procedure: BIOPSY;  Surgeon: Ace Holder, MD;  Location: [facility] ENDOSCOPY;  Service: Gastroenterology;;   COLONOSCOPY WITH PROPOFOL  N/A 04/06/2023   Procedure: COLONOSCOPY WITH PROPOFOL ;  Surgeon: Ace Holder, MD;  Location: [facility] ENDOSCOPY;  Service: Gastroenterology;  Laterality: N/A;   ESOPHAGOGASTRODUODENOSCOPY (EGD) WITH PROPOFOL  N/A 04/06/2023   Procedure: ESOPHAGOGASTRODUODENOSCOPY (EGD) WITH PROPOFOL ;  Surgeon: Ace Holder, MD;  Location: [facility] ENDOSCOPY;  Service: Gastroenterology;  Laterality: N/A;   GIVENS CAPSULE STUDY N/A 04/06/2023   Procedure: GIVENS CAPSULE STUDY;  Surgeon: Ace Holder, MD;  Location: [facility] ENDOSCOPY;  Service: Gastroenterology;  Laterality: N/A;   PERIPHERAL VASCULAR ATHERECTOMY Right 01/14/2020   Procedure: PERIPHERAL VASCULAR ATHERECTOMY;  Surgeon: Young Hensen, MD;  Location: [facility] INVASIVE CV LAB;  Service: Cardiovascular;  Laterality: Right;  sfa   PERIPHERAL VASCULAR INTERVENTION Right 01/14/2020   Procedure: PERIPHERAL VASCULAR INTERVENTION;  Surgeon: Young Hensen, MD;  Location: [facility];  Service: Cardiovascular;   Laterality: Right;  sfa stent    PERIPHERAL VASCULAR INTERVENTION  07/05/2022   Procedure: PERIPHERAL VASCULAR INTERVENTION;  Surgeon: Young Hensen, MD;  Location: [facility];  Service: Cardiovascular;;   PERIPHERAL VASCULAR INTERVENTION  08/02/2022   Procedure: PERIPHERAL VASCULAR INTERVENTION;  Surgeon: Young Hensen, MD;  Location: [facility];  Service: Cardiovascular;;   PERIPHERAL VASCULAR INTERVENTION  08/06/2022   Procedure: PERIPHERAL VASCULAR INTERVENTION;  Surgeon: Young Hensen, MD;  Location: [facility] INVASIVE CV LAB;  Service: Cardiovascular;;   PERIPHERAL VASCULAR THROMBECTOMY  08/02/2022   Procedure: PERIPHERAL VASCULAR THROMBECTOMY;  Surgeon: Young Hensen, MD;  Location: [facility];  Service: Cardiovascular;;   RIGHT/LEFT HEART CATH AND CORONARY ANGIOGRAPHY N/A 08/23/2022   Procedure: RIGHT/LEFT HEART CATH AND CORONARY ANGIOGRAPHY;  Surgeon: Lucendia Rusk, MD;  Location: [facility] INVASIVE CV LAB;  Service: Cardiovascular;  Laterality: N/A;   SKIN SPLIT GRAFT Right 03/18/2020   Procedure: SKIN GRAFTING RIGHT FOOT ULCER;  Surgeon: Timothy Ford, MD;  Location: [facility] OR;  Service: Orthopedics;  Laterality: Right;   STUMP REVISION Right 11/30/2022   Procedure: REVISION RIGHT TRANSMETATARSAL AMPUTATION;  Surgeon: Timothy Ford, MD;  Location: [facility] OR;  Service: Orthopedics;  Laterality: Right;   TEE WITHOUT CARDIOVERSION N/A 08/27/2022   Procedure: TRANSESOPHAGEAL ECHOCARDIOGRAM;  Surgeon: Sonny Dust, MD;  Location: [facility] INVASIVE CV LAB;  Service: Cardiovascular;  Laterality: N/A;   WISDOM TOOTH EXTRACTION      Family History  Problem Relation Age of Onset   Diabetes Other    CAD Other     SOCIAL HISTORY: Social History   Tobacco Use   Smoking status: Former    Current packs/day: 0.00    Types: Cigarettes    Quit date: 01/2017    Years since quitting: 6.6   Smokeless tobacco: Never  Substance Use Topics   Alcohol use: Not  Currently    Allergies  Allergen Reactions   Trental  [Pentoxifylline ] Nausea And Vomiting   Vibramycin [Doxycycline] Nausea And Vomiting   Penicillins Rash    Tolerated cephalosporins 07/2022     Current Outpatient Medications  Medication Sig Dispense Refill   acetaminophen  (TYLENOL ) 500 MG tablet Take 1,000 mg by mouth 2 (two) times daily as needed for moderate pain (pain score 4-6), headache or fever.     Alcohol Swabs (PHARMACIST CHOICE ALCOHOL) PADS Apply topically.     aspirin  81 MG EC tablet Take 81 mg by mouth daily.     atorvastatin  (LIPITOR) 40 MG tablet Take 40 mg by mouth daily.     clopidogrel  (PLAVIX ) 75 MG tablet TAKE 1 TABLET BY MOUTH EVERY DAY WITH BREAKFAST 90 tablet 3   diphenhydrAMINE HCl, Sleep, (ZZZQUIL) 25 MG CAPS Take 25 mg by mouth at bedtime as needed (Sleep).     ferrous sulfate  325 (65 FE) MG EC tablet Take 1 tablet (325 mg total) by mouth daily with breakfast. 60 tablet 2   furosemide  (LASIX ) 40 MG tablet Take 1 tablet (40 mg total) by mouth 2 (two) times daily. 30 tablet 3   gabapentin  (NEURONTIN ) 300 MG capsule Take 300 mg by mouth at bedtime.     insulin  degludec (TRESIBA  FLEXTOUCH) 100 UNIT/ML FlexTouch Pen Inject 10 Units into the skin at bedtime. (Patient taking differently:  Inject 16 Units into the skin at bedtime.) 3 mL 3   levothyroxine  (SYNTHROID ) 150 MCG tablet Take 150 mcg by mouth daily before breakfast.     metoprolol  succinate (TOPROL -XL) 25 MG 24 hr tablet Take 0.5 tablets (12.5 mg total) by mouth daily. 30 tablet 1   nitroGLYCERIN  (NITROSTAT ) 0.4 MG SL tablet Place 1 tablet (0.4 mg total) under the tongue every 5 (five) minutes x 3 doses as needed for chest pain (if no relief after 3rd dose proceed to ED or call 911). 11/06/2022-New 25 tablet 1   oxyCODONE -acetaminophen  (PERCOCET/ROXICET) 5-325 MG tablet Take 1 tablet by mouth every 6 (six) hours as needed for severe pain (pain score 7-10). 15 tablet 0   pantoprazole  (PROTONIX ) 40 MG tablet Take  40 mg by mouth daily.     potassium chloride  (KLOR-CON ) 20 MEQ packet Take 40 mEq by mouth daily. 60 packet 1   promethazine  (PHENERGAN ) 25 MG tablet Take 1 tablet (25 mg total) by mouth every 6 (six) hours as needed for nausea or vomiting. 30 tablet 0   No current facility-administered medications for this visit.    REVIEW OF SYSTEMS:  [X]  denotes positive finding, [ ]  denotes negative finding Cardiac  Comments:  Chest pain or chest pressure:    Shortness of breath upon exertion:    Short of breath when lying flat:    Irregular heart rhythm:        Vascular    Pain in calf, thigh, or hip brought on by ambulation:    Pain in feet at night that wakes you up from your sleep:     Blood clot in your veins:    Leg swelling:         Pulmonary    Oxygen  at home:    Productive cough:     Wheezing:         Neurologic    Sudden weakness in arms or legs:     Sudden numbness in arms or legs:     Sudden onset of difficulty speaking or slurred speech:    Temporary loss of vision in one eye:     Problems with dizziness:         Gastrointestinal    Blood in stool:     Vomited blood:         Genitourinary    Burning when urinating:     Blood in urine:        Psychiatric    Major depression:         Hematologic    Bleeding problems:    Problems with blood clotting too easily:        Skin    Rashes or ulcers:        Constitutional    Fever or chills:      PHYSICAL EXAM: Vitals:   09/24/23 1411  BP: (!) 153/68  Pulse: 64  Resp: 20  Temp: 98.2 F (36.8 C)  TempSrc: Temporal  SpO2: 100%  Weight: 207 lb 8 oz (94.1 kg)  Height: 5\' 4"  (1.626 m)    GENERAL: The patient is a well-nourished female, in no acute distress. The vital signs are documented above. CARDIAC: There is a regular rate and rhythm.  VASCULAR:  Bilateral femoral pulses palpable Bilateral TMA's healed and right TMA pictured Brisk right PT and DP signal at the ankle   DATA:   ABIs today are 0.77 on  the right and 1.01 on the left  Lower extremity arterial  duplex shows patent left SFA stents.  There is moderate 50 to 74% stenosis in right SFA stent with a max velocity of 339.  Assessment/Plan:  58 year old female presents for 6 month interval follow-up after previous bilateral lower extremity multilevel complex revascularization for critical limb ischemia with tissue loss.  She has since undergone bilateral TMA's with Dr. Julio Ohm on 08/08/2022.  Both TMA's have now healed.  On 55-month follow-up today discussed that her stents in both lower extremities are patent.  On the left these are widely patent without any stenosis.  On the right she does have some evidence of moderate in-stent stenosis on the right.  That being said she has no lower extremity complaints.  She has brisk Doppler signals at the ankle on exam.  Discussed returning to the Cath Lab for angiogram to evaluate her stents versus short interval follow-up and we ultimately decided on short follow-up.  I will see her in 3 months with right leg arterial duplex and ABIs and discuss she stay on aspirin  Plavix  statin.  Young Hensen, MD [facility] Office: [phone]

## 2023-09-24 NOTE — Telephone Encounter (Signed)
 Patient has an appointment with cardiology on 6/10.  I will follow up with her then.

## 2023-09-25 ENCOUNTER — Ambulatory Visit: Payer: Self-pay | Admitting: Physician Assistant

## 2023-09-26 ENCOUNTER — Other Ambulatory Visit: Payer: Self-pay | Admitting: *Deleted

## 2023-09-26 DIAGNOSIS — I739 Peripheral vascular disease, unspecified: Secondary | ICD-10-CM

## 2023-09-26 DIAGNOSIS — I70223 Atherosclerosis of native arteries of extremities with rest pain, bilateral legs: Secondary | ICD-10-CM

## 2023-10-08 ENCOUNTER — Ambulatory Visit: Payer: Managed Care, Other (non HMO) | Admitting: Cardiovascular Disease

## 2023-10-11 NOTE — Telephone Encounter (Signed)
 Will update surgeons office that the pt has upcoming IN OFFICE appt 10/16/23 with Lauro Portal, MD

## 2023-10-11 NOTE — Telephone Encounter (Signed)
 [facility] Pre-operative Risk Assessment     Request for surgical clearance:     Endoscopy Procedure  What type of surgery is being performed?     Colonoscopy     When is this surgery scheduled?     TBD  What type of clearance is required ?   Pharmacy  Are there any medications that need to be held prior to surgery and how long? Plavix  5 days  Practice name and name of physician performing surgery?      [facility]  What is your office phone and fax number?      Phone- [phone]  Fax- [phone]  Anesthesia type (None, local, MAC, general) ?       MAC     Please route your response to [employer], CMA

## 2023-10-11 NOTE — Telephone Encounter (Signed)
   Name: Kayla Schmitt  DOB: 04-06-1966  MRN: [id number]  Primary Cardiologist: Vishnu P Mallipeddi, MD  Chart reviewed as part of pre-operative protocol coverage. Because of Kayla Schmitt's past medical history and time since last visit, she will require a follow-up in-office visit in order to better assess preoperative cardiovascular risk.  Pre-op covering staff: - Patient is scheduled to see Dr. Katheryne Pane on 6/18. I have updated the appointment notes to reflect need for preop evaluation  - Please contact requesting surgeon's office via preferred method (i.e, phone, fax) to inform them of need for appointment prior to surgery.    Debria Fang, PA-C  10/11/2023, 3:34 PM

## 2023-10-15 ENCOUNTER — Encounter (HOSPITAL_COMMUNITY): Payer: Self-pay

## 2023-10-15 ENCOUNTER — Other Ambulatory Visit: Payer: Self-pay

## 2023-10-15 ENCOUNTER — Emergency Department (HOSPITAL_COMMUNITY)
Admission: EM | Admit: 2023-10-15 | Discharge: 2023-10-15 | Disposition: A | Discharge status: Home / Self Care | Attending: Emergency Medicine | Admitting: Emergency Medicine

## 2023-10-15 DIAGNOSIS — E119 Type 2 diabetes mellitus without complications: Secondary | ICD-10-CM | POA: Insufficient documentation

## 2023-10-15 DIAGNOSIS — Z794 Long term (current) use of insulin: Secondary | ICD-10-CM | POA: Diagnosis not present

## 2023-10-15 DIAGNOSIS — R1012 Left upper quadrant pain: Secondary | ICD-10-CM | POA: Diagnosis not present

## 2023-10-15 DIAGNOSIS — R112 Nausea with vomiting, unspecified: Secondary | ICD-10-CM | POA: Insufficient documentation

## 2023-10-15 DIAGNOSIS — I509 Heart failure, unspecified: Secondary | ICD-10-CM | POA: Insufficient documentation

## 2023-10-15 DIAGNOSIS — Z87891 Personal history of nicotine dependence: Secondary | ICD-10-CM | POA: Diagnosis not present

## 2023-10-15 DIAGNOSIS — E039 Hypothyroidism, unspecified: Secondary | ICD-10-CM | POA: Diagnosis not present

## 2023-10-15 DIAGNOSIS — I110 Hypertensive heart disease with heart failure: Secondary | ICD-10-CM | POA: Diagnosis not present

## 2023-10-15 LAB — COMPREHENSIVE METABOLIC PANEL WITH GFR
ALT: 12 U/L (ref 0–44)
AST: 19 U/L (ref 15–41)
Albumin: 3.2 g/dL — ABNORMAL LOW (ref 3.5–5.0)
Alkaline Phosphatase: 111 U/L (ref 38–126)
Anion gap: 14 (ref 5–15)
BUN: 45 mg/dL — ABNORMAL HIGH (ref 6–20)
CO2: 27 mmol/L (ref 22–32)
Calcium: 9.2 mg/dL (ref 8.9–10.3)
Chloride: 98 mmol/L (ref 98–111)
Creatinine, Ser: 1.52 mg/dL — ABNORMAL HIGH (ref 0.44–1.00)
GFR, Estimated: 40 mL/min — ABNORMAL LOW (ref >60)
Glucose, Bld: 216 mg/dL — ABNORMAL HIGH (ref 70–99)
Potassium: 3.5 mmol/L (ref 3.5–5.1)
Sodium: 139 mmol/L (ref 135–145)
Total Bilirubin: 1.2 mg/dL (ref 0.0–1.2)
Total Protein: 7.9 g/dL (ref 6.5–8.1)

## 2023-10-15 LAB — URINALYSIS, ROUTINE W REFLEX MICROSCOPIC
Bilirubin Urine: NEGATIVE
Glucose, UA: 50 mg/dL — AB
Ketones, ur: NEGATIVE mg/dL
Leukocytes,Ua: NEGATIVE
Nitrite: NEGATIVE
Protein, ur: 100 mg/dL — AB
Specific Gravity, Urine: 1.024 (ref 1.005–1.030)
pH: 5 (ref 5.0–8.0)

## 2023-10-15 LAB — CBC WITH DIFFERENTIAL/PLATELET
Abs Immature Granulocytes: 0.03 10*3/uL (ref 0.00–0.07)
Basophils Absolute: 0 10*3/uL (ref 0.0–0.1)
Basophils Relative: 0 %
Eosinophils Absolute: 0 10*3/uL (ref 0.0–0.5)
Eosinophils Relative: 0 %
HCT: 31.1 % — ABNORMAL LOW (ref 36.0–46.0)
Hemoglobin: 10.2 g/dL — ABNORMAL LOW (ref 12.0–15.0)
Immature Granulocytes: 0 %
Lymphocytes Relative: 6 %
Lymphs Abs: 0.4 10*3/uL — ABNORMAL LOW (ref 0.7–4.0)
MCH: 29.4 pg (ref 26.0–34.0)
MCHC: 32.8 g/dL (ref 30.0–36.0)
MCV: 89.6 fL (ref 80.0–100.0)
Monocytes Absolute: 0.6 10*3/uL (ref 0.1–1.0)
Monocytes Relative: 8 %
Neutro Abs: 6.9 10*3/uL (ref 1.7–7.7)
Neutrophils Relative %: 86 %
Platelets: 266 10*3/uL (ref 150–400)
RBC: 3.47 MIL/uL — ABNORMAL LOW (ref 3.87–5.11)
RDW: 13.2 % (ref 11.5–15.5)
WBC: 8 10*3/uL (ref 4.0–10.5)
nRBC: 0 % (ref 0.0–0.2)

## 2023-10-15 LAB — LIPASE, BLOOD: Lipase: 28 U/L (ref 11–51)

## 2023-10-15 MED ORDER — PROMETHAZINE HCL 25 MG PO TABS: 25.0000 mg | ORAL_TABLET | Freq: Four times a day (QID) | ORAL | 0 refills | Status: AC | PRN

## 2023-10-15 MED ORDER — OXYCODONE HCL 5 MG PO TABS: 5.0000 mg | ORAL_TABLET | Freq: Four times a day (QID) | ORAL | 0 refills | Status: DC | PRN

## 2023-10-15 MED ORDER — SODIUM CHLORIDE 0.9 % IV BOLUS
1000.0000 mL | Freq: Once | INTRAVENOUS | Status: AC
  Administered 2023-10-15: 1000 mL via INTRAVENOUS

## 2023-10-15 MED ORDER — SODIUM CHLORIDE 0.9 % IV SOLN: 12.5000 mg | Freq: Four times a day (QID) | INTRAVENOUS | Status: DC | PRN

## 2023-10-15 MED ORDER — SODIUM CHLORIDE 0.9 % IV SOLN
12.5000 mg | Freq: Once | INTRAVENOUS | Status: AC
  Administered 2023-10-15: 12.5 mg via INTRAVENOUS
  Filled 2023-10-15: qty 0.5

## 2023-10-15 MED ORDER — HYDROMORPHONE HCL 1 MG/ML IJ SOLN
0.5000 mg | Freq: Once | INTRAMUSCULAR | Status: AC
  Administered 2023-10-15: 0.5 mg via INTRAVENOUS
  Filled 2023-10-15: qty 0.5

## 2023-10-15 MED ORDER — ONDANSETRON 4 MG PO TBDP
4.0000 mg | ORAL_TABLET | Freq: Once | ORAL | Status: AC | PRN
  Administered 2023-10-15: 4 mg via ORAL
  Filled 2023-10-15: qty 1

## 2023-10-15 NOTE — Discharge Instructions (Addendum)
 We evaluated you for your nausea and vomiting.  Your symptoms are most likely due to Ozempic.  Your symptoms improved with treatment in the emergency department.  We have prescribed you some Phenergan  and a small amount of oxycodone .  Please take this only as needed.  These medicines can make you drowsy so be careful in taking them.  If you have any new or worsening symptoms such as severe or uncontrolled pain, fevers, uncontrolled vomiting, lightheadedness or dizziness, fainting, or any other new symptoms, please return to the emergency department.

## 2023-10-15 NOTE — ED Provider Notes (Signed)
 [place] EMERGENCY DEPARTMENT AT [facility] Provider Note  CSN: [id number] Arrival date & time: 10/15/23 1617  Chief Complaint(s) Emesis  HPI Kayla Schmitt is a 58 y.o. female history of diabetes is, CAD, CHF, hypertension, hyperlipidemia presenting to the emergency department with left upper quadrant pain. She reports symptoms feel same as prior episodes of abdominal pain, nausea, vomiting after starting ozempic. She reports she recently restarted ozempic and was hopeful she would not have recurrent symptoms however they have recurred. She denies hematemesis, fevers or chills, dysuria, diarrhea, chest pain, shortness of breath, or any other new symptoms.    Past Medical History Past Medical History:  Diagnosis Date   Anemia    Aortic stenosis    Arthritis    CHF (congestive heart failure) (HCC)    Coronary artery disease    Diabetes mellitus without complication (HCC)    type 2   GERD (gastroesophageal reflux disease)    Heart murmur    History of blood transfusion    Hypercholesteremia    Hypertension    Hypothyroidism    Mitral regurgitation    Myocardial infarction [facility])    Peripheral vascular disease (HCC)    PONV (postoperative nausea and vomiting)    Thyroid disease    Patient Active Problem List   Diagnosis Date Noted   AVM (arteriovenous malformation) of small bowel, acquired 04/07/2023   Antiplatelet or antithrombotic long-term use 04/04/2023   Iron deficiency anemia 04/04/2023   Acute on chronic combined systolic (congestive) and diastolic (congestive) heart failure (HCC) 04/03/2023   Osteomyelitis of foot, right, acute (HCC) 12/12/2022   CAD (coronary artery disease) 11/06/2022   Aortic stenosis 11/06/2022   Mitral regurgitation 08/27/2022   NSTEMI (non-ST elevated myocardial infarction) (HCC) 08/18/2022   Acute clinical systolic heart failure (HCC) 08/18/2022   Acute blood loss anemia 08/18/2022   Symptomatic anemia 08/18/2022    Critical limb ischemia of both lower extremities (HCC) 06/19/2022   DM type 2 (diabetes mellitus, type 2) (HCC) 01/28/2020   Hyperlipidemia 01/28/2020   Hypertension 01/28/2020   Hypothyroidism 01/28/2020   Obesity 01/28/2020   Ulcerated, foot, right, limited to breakdown of skin (HCC)    Cellulitis of right foot    Peripheral arterial disease (HCC)    Cellulitis 01/11/2020   Blister of leg 01/06/2020   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/28/2017   Chest pain 10/28/2017   GERD (gastroesophageal reflux disease) 10/28/2017   Home Medication(s) Prior to Admission medications   Medication Sig Start Date End Date Taking? Authorizing Provider  oxyCODONE  (ROXICODONE ) 5 MG immediate release tablet Take 1 tablet (5 mg total) by mouth every 6 (six) hours as needed for severe pain (pain score 7-10). 10/15/23  Yes Mordecai Applebaum, MD  promethazine  (PHENERGAN ) 25 MG tablet Take 1 tablet (25 mg total) by mouth every 6 (six) hours as needed for nausea or vomiting. 10/15/23  Yes Mordecai Applebaum, MD  acetaminophen  (TYLENOL ) 500 MG tablet Take 1,000 mg by mouth 2 (two) times daily as needed for moderate pain (pain score 4-6), headache or fever.    [provider]  Alcohol Swabs (PHARMACIST CHOICE ALCOHOL) PADS Apply topically. 04/09/23   [provider]  aspirin  81 MG EC tablet Take 81 mg by mouth daily.    [provider]  atorvastatin  (LIPITOR) 40 MG tablet Take 40 mg by mouth daily. 12/14/19   [provider]  clopidogrel  (PLAVIX ) 75 MG tablet TAKE 1 TABLET BY MOUTH EVERY DAY  WITH BREAKFAST 01/28/23   Adine Hoof, MD  diphenhydrAMINE HCl, Sleep, (ZZZQUIL) 25 MG CAPS Take 25 mg by mouth at bedtime as needed (Sleep).    [provider]  ferrous sulfate  325 (65 FE) MG EC tablet Take 1 tablet (325 mg total) by mouth daily with breakfast. 04/07/23 04/06/24  Deforest Fast, MD  furosemide  (LASIX ) 40 MG tablet Take 1 tablet (40 mg total) by  mouth 2 (two) times daily. 08/27/22   Magdalene School, MD  gabapentin  (NEURONTIN ) 300 MG capsule Take 300 mg by mouth at bedtime.    [provider]  insulin  degludec (TRESIBA  FLEXTOUCH) 100 UNIT/ML FlexTouch Pen Inject 10 Units into the skin at bedtime. Patient taking differently: Inject 16 Units into the skin at bedtime. 01/15/20   Ephriam Hashimoto, MD  levothyroxine  (SYNTHROID ) 150 MCG tablet Take 150 mcg by mouth daily before breakfast. 12/02/19   [provider]  metoprolol  succinate (TOPROL -XL) 25 MG 24 hr tablet Take 0.5 tablets (12.5 mg total) by mouth daily. 08/28/22   Magdalene School, MD  nitroGLYCERIN  (NITROSTAT ) 0.4 MG SL tablet Place 1 tablet (0.4 mg total) under the tongue every 5 (five) minutes x 3 doses as needed for chest pain (if no relief after 3rd dose proceed to ED or call 911). 11/06/2022-New 11/06/22   Mallipeddi, Vishnu P, MD  pantoprazole  (PROTONIX ) 40 MG tablet Take 40 mg by mouth daily. 12/21/19   [provider]  potassium chloride  (KLOR-CON ) 20 MEQ packet Take 40 mEq by mouth daily. 04/07/23   Deforest Fast, MD                                                                                                                                    Past Surgical History Past Surgical History:  Procedure Laterality Date   ABDOMINAL AORTOGRAM W/LOWER EXTREMITY Bilateral 01/14/2020   Procedure: ABDOMINAL AORTOGRAM W/LOWER EXTREMITY;  Surgeon: Young Hensen, MD;  Location: [facility] INVASIVE CV LAB;  Service: Cardiovascular;  Laterality: Bilateral;   ABDOMINAL AORTOGRAM W/LOWER EXTREMITY N/A 07/05/2022   Procedure: ABDOMINAL AORTOGRAM W/LOWER EXTREMITY;  Surgeon: Young Hensen, MD;  Location: [facility];  Service: Cardiovascular;  Laterality: N/A;   ABDOMINAL AORTOGRAM W/LOWER EXTREMITY N/A 08/02/2022   Procedure: ABDOMINAL AORTOGRAM W/LOWER EXTREMITY;  Surgeon: Young Hensen, MD;  Location: [facility];  Service: Cardiovascular;   Laterality: N/A;   ABDOMINAL AORTOGRAM W/LOWER EXTREMITY N/A 08/06/2022   Procedure: ABDOMINAL AORTOGRAM W/LOWER EXTREMITY;  Surgeon: Young Hensen, MD;  Location: [facility];  Service: Cardiovascular;  Laterality: N/A;   AMPUTATION Bilateral 07/20/2022   Procedure: AMPUTATION RIGHT GREAT TOE AND SECOND TOE, AMPUTATION LEFT GREAT TOE;  Surgeon: Timothy Ford, MD;  Location: [facility] OR;  Service: Orthopedics;  Laterality: Bilateral;   AMPUTATION Bilateral 08/08/2022   Procedure: BILATERAL TRANSMETATARSAL AMPUTATION;  Surgeon: Timothy Ford, MD;  Location: [facility] OR;  Service: Orthopedics;  Laterality: Bilateral;   AMPUTATION TOE     BIOPSY  04/06/2023   Procedure: BIOPSY;  Surgeon: Ace Holder, MD;  Location: [facility] ENDOSCOPY;  Service: Gastroenterology;;   COLONOSCOPY WITH PROPOFOL  N/A 04/06/2023   Procedure: COLONOSCOPY WITH PROPOFOL ;  Surgeon: Ace Holder, MD;  Location: [facility] ENDOSCOPY;  Service: Gastroenterology;  Laterality: N/A;   ESOPHAGOGASTRODUODENOSCOPY (EGD) WITH PROPOFOL  N/A 04/06/2023   Procedure: ESOPHAGOGASTRODUODENOSCOPY (EGD) WITH PROPOFOL ;  Surgeon: Ace Holder, MD;  Location: [facility] ENDOSCOPY;  Service: Gastroenterology;  Laterality: N/A;   GIVENS CAPSULE STUDY N/A 04/06/2023   Procedure: GIVENS CAPSULE STUDY;  Surgeon: Ace Holder, MD;  Location: [facility] ENDOSCOPY;  Service: Gastroenterology;  Laterality: N/A;   PERIPHERAL VASCULAR ATHERECTOMY Right 01/14/2020   Procedure: PERIPHERAL VASCULAR ATHERECTOMY;  Surgeon: Young Hensen, MD;  Location: [facility] INVASIVE CV LAB;  Service: Cardiovascular;  Laterality: Right;  sfa   PERIPHERAL VASCULAR INTERVENTION Right 01/14/2020   Procedure: PERIPHERAL VASCULAR INTERVENTION;  Surgeon: Young Hensen, MD;  Location: [facility];  Service: Cardiovascular;  Laterality: Right;  sfa stent    PERIPHERAL VASCULAR INTERVENTION  07/05/2022   Procedure: PERIPHERAL VASCULAR INTERVENTION;  Surgeon: Young Hensen, MD;  Location: [facility];  Service: Cardiovascular;;   PERIPHERAL VASCULAR INTERVENTION  08/02/2022   Procedure: PERIPHERAL VASCULAR INTERVENTION;  Surgeon: Young Hensen, MD;  Location: [facility];  Service: Cardiovascular;;   PERIPHERAL VASCULAR INTERVENTION  08/06/2022   Procedure: PERIPHERAL VASCULAR INTERVENTION;  Surgeon: Young Hensen, MD;  Location: [facility] INVASIVE CV LAB;  Service: Cardiovascular;;   PERIPHERAL VASCULAR THROMBECTOMY  08/02/2022   Procedure: PERIPHERAL VASCULAR THROMBECTOMY;  Surgeon: Young Hensen, MD;  Location: [facility];  Service: Cardiovascular;;   RIGHT/LEFT HEART CATH AND CORONARY ANGIOGRAPHY N/A 08/23/2022   Procedure: RIGHT/LEFT HEART CATH AND CORONARY ANGIOGRAPHY;  Surgeon: Lucendia Rusk, MD;  Location: [facility] INVASIVE CV LAB;  Service: Cardiovascular;  Laterality: N/A;   SKIN SPLIT GRAFT Right 03/18/2020   Procedure: SKIN GRAFTING RIGHT FOOT ULCER;  Surgeon: Timothy Ford, MD;  Location: [facility] OR;  Service: Orthopedics;  Laterality: Right;   STUMP REVISION Right 11/30/2022   Procedure: REVISION RIGHT TRANSMETATARSAL AMPUTATION;  Surgeon: Timothy Ford, MD;  Location: [facility] OR;  Service: Orthopedics;  Laterality: Right;   TEE WITHOUT CARDIOVERSION N/A 08/27/2022   Procedure: TRANSESOPHAGEAL ECHOCARDIOGRAM;  Surgeon: Sonny Dust, MD;  Location: [facility] INVASIVE CV LAB;  Service: Cardiovascular;  Laterality: N/A;   WISDOM TOOTH EXTRACTION     Family History Family History  Problem Relation Age of Onset   Diabetes Other    CAD Other     Social History Social History   Tobacco Use   Smoking status: Former    Current packs/day: 0.00    Types: Cigarettes    Quit date: 01/2017    Years since quitting: 6.7   Smokeless tobacco: Never  Vaping Use   Vaping status: Never Used  Substance Use Topics   Alcohol use: Not Currently   Drug use: Yes    Types: Marijuana    Comment: on ocassion- Last time -  11/13/22   Allergies Trental  [pentoxifylline ], Vibramycin [doxycycline], and Penicillins  Review of Systems Review of Systems  All other systems reviewed and are negative.   Physical Exam Vital Signs  I have reviewed the triage vital signs BP (!) 143/70 (BP Location: Left Arm)   Pulse (!) 110   Temp 98.5 F (36.9 C) (Oral)  Resp (!) 25   Ht 5' 4 (1.626 m)   Wt 94.1 kg   SpO2 90%   BMI 35.62 kg/m  Physical Exam Vitals and nursing note reviewed.  Constitutional:      General: She is not in acute distress.    Appearance: She is well-developed.  HENT:     Head: Normocephalic and atraumatic.     Mouth/Throat:     Mouth: Mucous membranes are dry.   Eyes:     Pupils: Pupils are equal, round, and reactive to light.    Cardiovascular:     Rate and Rhythm: Normal rate and regular rhythm.     Heart sounds: No murmur heard. Pulmonary:     Effort: Pulmonary effort is normal. No respiratory distress.     Breath sounds: Normal breath sounds.  Abdominal:     General: Abdomen is flat.     Palpations: Abdomen is soft.     Tenderness: There is no abdominal tenderness.   Musculoskeletal:        General: No tenderness.     Right lower leg: No edema.     Left lower leg: No edema.   Skin:    General: Skin is warm and dry.   Neurological:     General: No focal deficit present.     Mental Status: She is alert. Mental status is at baseline.   Psychiatric:        Mood and Affect: Mood normal.        Behavior: Behavior normal.     ED Results and Treatments Labs (all labs ordered are listed, but only abnormal results are displayed) Labs Reviewed  COMPREHENSIVE METABOLIC PANEL WITH GFR - Abnormal; Notable for the following components:      Result Value   Glucose, Bld 216 (*)    BUN 45 (*)    Creatinine, Ser 1.52 (*)    Albumin 3.2 (*)    GFR, Estimated 40 (*)    All other components within normal limits  URINALYSIS, ROUTINE W REFLEX MICROSCOPIC - Abnormal; Notable for  the following components:   Color, Urine AMBER (*)    APPearance HAZY (*)    Glucose, UA 50 (*)    Hgb urine dipstick SMALL (*)    Protein, ur 100 (*)    Bacteria, UA RARE (*)    All other components within normal limits  CBC WITH DIFFERENTIAL/PLATELET - Abnormal; Notable for the following components:   RBC 3.47 (*)    Hemoglobin 10.2 (*)    HCT 31.1 (*)    Lymphs Abs 0.4 (*)    All other components within normal limits  LIPASE, BLOOD                                                                                                                          Radiology No results found.  Pertinent labs & imaging results that were available during my care of the patient were reviewed by me  and considered in my medical decision making (see MDM for details).  Medications Ordered in ED Medications  ondansetron  (ZOFRAN -ODT) disintegrating tablet 4 mg (4 mg Oral Given 10/15/23 1634)  sodium chloride  0.9 % bolus 1,000 mL (0 mLs Intravenous Stopped 10/15/23 2249)  HYDROmorphone  (DILAUDID ) injection 0.5 mg (0.5 mg Intravenous Given 10/15/23 2021)  promethazine  (PHENERGAN ) 12.5 mg in sodium chloride  0.9 % 50 mL IVPB (0 mg Intravenous Stopped 10/15/23 2130)                                                                                                                                     Procedures Procedures  (including critical care time)  Medical Decision Making / ED Course   MDM:  58 y/o presenting with nausea, vomiting, abdominal pain.   Patient well appearing, appears mildly dehydrated. Vitals with mild tachycardia likely due to dehydration  Suspect gastroparesis or ozempic side effect. She received nausea and pain medication with improvement in her symptoms, requests discharge. Also received IV fluids. Labs with creatinine around baseline. Considered also other causes of abdominal pain including obstruction or perforation, volvulus, pancreatitis, diverticulitis, cholecystitis,  nephrolithiasis, UTI but given similarity to previous episodes and reassuring exam dont think patient needs imaging at this time given resolution of symptoms. Recommended strict er precautions for any worsening.   Will discharge patient to home. All questions answered. Patient comfortable with plan of discharge. Return precautions discussed with patient and specified on the after visit summary.       Additional history obtained: -Additional history obtained from friend -External records from outside source obtained and reviewed including: Chart review including previous notes, labs, imaging, consultation notes including prior notes and er visit for similar    Lab Tests: -I ordered, reviewed, and interpreted labs.   The pertinent results include:   Labs Reviewed  COMPREHENSIVE METABOLIC PANEL WITH GFR - Abnormal; Notable for the following components:      Result Value   Glucose, Bld 216 (*)    BUN 45 (*)    Creatinine, Ser 1.52 (*)    Albumin 3.2 (*)    GFR, Estimated 40 (*)    All other components within normal limits  URINALYSIS, ROUTINE W REFLEX MICROSCOPIC - Abnormal; Notable for the following components:   Color, Urine AMBER (*)    APPearance HAZY (*)    Glucose, UA 50 (*)    Hgb urine dipstick SMALL (*)    Protein, ur 100 (*)    Bacteria, UA RARE (*)    All other components within normal limits  CBC WITH DIFFERENTIAL/PLATELET - Abnormal; Notable for the following components:   RBC 3.47 (*)    Hemoglobin 10.2 (*)    HCT 31.1 (*)    Lymphs Abs 0.4 (*)    All other components within normal limits  LIPASE, BLOOD    Notable for normal lipase, mild anemia, Cr around baseline, UA not concerning for  UTI  EKG   EKG Interpretation Date/Time:    Ventricular Rate:    PR Interval:    QRS Duration:    QT Interval:    QTC Calculation:   R Axis:      Text Interpretation:            Medicines ordered and prescription drug management: Meds ordered this encounter   Medications   ondansetron  (ZOFRAN -ODT) disintegrating tablet 4 mg   sodium chloride  0.9 % bolus 1,000 mL   DISCONTD: promethazine  (PHENERGAN ) 12.5 mg in sodium chloride  0.9 % 50 mL IVPB   HYDROmorphone  (DILAUDID ) injection 0.5 mg   promethazine  (PHENERGAN ) 12.5 mg in sodium chloride  0.9 % 50 mL IVPB   promethazine  (PHENERGAN ) 25 MG tablet    Sig: Take 1 tablet (25 mg total) by mouth every 6 (six) hours as needed for nausea or vomiting.    Dispense:  20 tablet    Refill:  0   oxyCODONE  (ROXICODONE ) 5 MG immediate release tablet    Sig: Take 1 tablet (5 mg total) by mouth every 6 (six) hours as needed for severe pain (pain score 7-10).    Dispense:  10 tablet    Refill:  0    -I have reviewed the patients home medicines and have made adjustments as needed   Cardiac Monitoring: The patient was maintained on a cardiac monitor.  I personally viewed and interpreted the cardiac monitored which showed an underlying rhythm of: sinus rhythm   Social Determinants of Health:  Diagnosis or treatment significantly limited by social determinants of health: obesity   Reevaluation: After the interventions noted above, I reevaluated the patient and found that their symptoms have resolved  Co morbidities that complicate the patient evaluation  Past Medical History:  Diagnosis Date   Anemia    Aortic stenosis    Arthritis    CHF (congestive heart failure) (HCC)    Coronary artery disease    Diabetes mellitus without complication (HCC)    type 2   GERD (gastroesophageal reflux disease)    Heart murmur    History of blood transfusion    Hypercholesteremia    Hypertension    Hypothyroidism    Mitral regurgitation    Myocardial infarction (HCC)    Peripheral vascular disease (HCC)    PONV (postoperative nausea and vomiting)    Thyroid disease       Dispostion: Disposition decision including need for hospitalization was considered, and patient discharged from emergency  department.    Final Clinical Impression(s) / ED Diagnoses Final diagnoses:  Nausea and vomiting, unspecified vomiting type     This chart was dictated using voice recognition software.  Despite best efforts to proofread,  errors can occur which can change the documentation meaning.    Mordecai Applebaum, MD 10/16/23 [phone]

## 2023-10-15 NOTE — ED Notes (Signed)
 ED Provider at bedside.

## 2023-10-15 NOTE — ED Triage Notes (Signed)
 Pt arrived via POV c/o N/V for past few days and reports it is related to her Ozempic. Pt reports she is going to call her PCP and stop taking the Ozempic as this is the 3rd time she has become ill from the medication.

## 2023-10-16 ENCOUNTER — Ambulatory Visit: Admitting: Cardiovascular Disease

## 2023-10-19 ENCOUNTER — Encounter (HOSPITAL_COMMUNITY): Payer: Self-pay

## 2023-10-20 ENCOUNTER — Inpatient Hospital Stay (HOSPITAL_COMMUNITY)
Admission: EM | Admit: 2023-10-20 | Discharge: 2023-10-29 | DRG: 239 | Disposition: A | Discharge status: Rehabilitation Facility | Source: Other Acute Inpatient Hospital | Attending: Internal Medicine | Admitting: Internal Medicine

## 2023-10-20 ENCOUNTER — Encounter (HOSPITAL_COMMUNITY): Payer: Self-pay | Admitting: Internal Medicine

## 2023-10-20 DIAGNOSIS — E1152 Type 2 diabetes mellitus with diabetic peripheral angiopathy with gangrene: Secondary | ICD-10-CM | POA: Diagnosis present

## 2023-10-20 DIAGNOSIS — E038 Other specified hypothyroidism: Secondary | ICD-10-CM | POA: Diagnosis not present

## 2023-10-20 DIAGNOSIS — G546 Phantom limb syndrome with pain: Secondary | ICD-10-CM | POA: Diagnosis not present

## 2023-10-20 DIAGNOSIS — K5901 Slow transit constipation: Secondary | ICD-10-CM | POA: Diagnosis not present

## 2023-10-20 DIAGNOSIS — Z7902 Long term (current) use of antithrombotics/antiplatelets: Secondary | ICD-10-CM

## 2023-10-20 DIAGNOSIS — I350 Nonrheumatic aortic (valve) stenosis: Secondary | ICD-10-CM | POA: Diagnosis present

## 2023-10-20 DIAGNOSIS — Z95820 Peripheral vascular angioplasty status with implants and grafts: Secondary | ICD-10-CM | POA: Diagnosis not present

## 2023-10-20 DIAGNOSIS — N179 Acute kidney failure, unspecified: Secondary | ICD-10-CM

## 2023-10-20 DIAGNOSIS — K5900 Constipation, unspecified: Secondary | ICD-10-CM | POA: Diagnosis present

## 2023-10-20 DIAGNOSIS — Z79890 Hormone replacement therapy (postmenopausal): Secondary | ICD-10-CM

## 2023-10-20 DIAGNOSIS — R9431 Abnormal electrocardiogram [ECG] [EKG]: Secondary | ICD-10-CM | POA: Diagnosis not present

## 2023-10-20 DIAGNOSIS — E7800 Pure hypercholesterolemia, unspecified: Secondary | ICD-10-CM | POA: Diagnosis present

## 2023-10-20 DIAGNOSIS — E876 Hypokalemia: Secondary | ICD-10-CM | POA: Diagnosis present

## 2023-10-20 DIAGNOSIS — M79605 Pain in left leg: Secondary | ICD-10-CM

## 2023-10-20 DIAGNOSIS — F411 Generalized anxiety disorder: Secondary | ICD-10-CM | POA: Diagnosis not present

## 2023-10-20 DIAGNOSIS — E1169 Type 2 diabetes mellitus with other specified complication: Secondary | ICD-10-CM

## 2023-10-20 DIAGNOSIS — R600 Localized edema: Secondary | ICD-10-CM | POA: Diagnosis not present

## 2023-10-20 DIAGNOSIS — I5042 Chronic combined systolic (congestive) and diastolic (congestive) heart failure: Secondary | ICD-10-CM | POA: Diagnosis present

## 2023-10-20 DIAGNOSIS — K922 Gastrointestinal hemorrhage, unspecified: Secondary | ICD-10-CM | POA: Diagnosis not present

## 2023-10-20 DIAGNOSIS — A419 Sepsis, unspecified organism: Principal | ICD-10-CM

## 2023-10-20 DIAGNOSIS — Z89522 Acquired absence of left knee: Secondary | ICD-10-CM | POA: Diagnosis not present

## 2023-10-20 DIAGNOSIS — I110 Hypertensive heart disease with heart failure: Secondary | ICD-10-CM | POA: Diagnosis present

## 2023-10-20 DIAGNOSIS — I2489 Other forms of acute ischemic heart disease: Secondary | ICD-10-CM | POA: Diagnosis present

## 2023-10-20 DIAGNOSIS — Z89432 Acquired absence of left foot: Secondary | ICD-10-CM

## 2023-10-20 DIAGNOSIS — R7881 Bacteremia: Secondary | ICD-10-CM | POA: Diagnosis present

## 2023-10-20 DIAGNOSIS — E66812 Obesity, class 2: Secondary | ICD-10-CM | POA: Diagnosis present

## 2023-10-20 DIAGNOSIS — I340 Nonrheumatic mitral (valve) insufficiency: Secondary | ICD-10-CM | POA: Diagnosis not present

## 2023-10-20 DIAGNOSIS — I96 Gangrene, not elsewhere classified: Secondary | ICD-10-CM

## 2023-10-20 DIAGNOSIS — L97329 Non-pressure chronic ulcer of left ankle with unspecified severity: Secondary | ICD-10-CM | POA: Diagnosis not present

## 2023-10-20 DIAGNOSIS — E119 Type 2 diabetes mellitus without complications: Secondary | ICD-10-CM | POA: Diagnosis not present

## 2023-10-20 DIAGNOSIS — Z89431 Acquired absence of right foot: Secondary | ICD-10-CM

## 2023-10-20 DIAGNOSIS — B9562 Methicillin resistant Staphylococcus aureus infection as the cause of diseases classified elsewhere: Secondary | ICD-10-CM | POA: Diagnosis present

## 2023-10-20 DIAGNOSIS — Z794 Long term (current) use of insulin: Secondary | ICD-10-CM | POA: Diagnosis not present

## 2023-10-20 DIAGNOSIS — I739 Peripheral vascular disease, unspecified: Secondary | ICD-10-CM | POA: Diagnosis not present

## 2023-10-20 DIAGNOSIS — Z6838 Body mass index (BMI) 38.0-38.9, adult: Secondary | ICD-10-CM

## 2023-10-20 DIAGNOSIS — E1140 Type 2 diabetes mellitus with diabetic neuropathy, unspecified: Secondary | ICD-10-CM | POA: Diagnosis present

## 2023-10-20 DIAGNOSIS — Z87891 Personal history of nicotine dependence: Secondary | ICD-10-CM

## 2023-10-20 DIAGNOSIS — E039 Hypothyroidism, unspecified: Secondary | ICD-10-CM | POA: Diagnosis present

## 2023-10-20 DIAGNOSIS — D649 Anemia, unspecified: Secondary | ICD-10-CM | POA: Diagnosis present

## 2023-10-20 DIAGNOSIS — Z7982 Long term (current) use of aspirin: Secondary | ICD-10-CM

## 2023-10-20 DIAGNOSIS — Z8679 Personal history of other diseases of the circulatory system: Secondary | ICD-10-CM | POA: Diagnosis not present

## 2023-10-20 DIAGNOSIS — E118 Type 2 diabetes mellitus with unspecified complications: Secondary | ICD-10-CM

## 2023-10-20 DIAGNOSIS — K5521 Angiodysplasia of colon with hemorrhage: Secondary | ICD-10-CM | POA: Diagnosis not present

## 2023-10-20 DIAGNOSIS — D62 Acute posthemorrhagic anemia: Secondary | ICD-10-CM | POA: Diagnosis not present

## 2023-10-20 DIAGNOSIS — I5033 Acute on chronic diastolic (congestive) heart failure: Secondary | ICD-10-CM | POA: Diagnosis not present

## 2023-10-20 DIAGNOSIS — E43 Unspecified severe protein-calorie malnutrition: Secondary | ICD-10-CM | POA: Diagnosis not present

## 2023-10-20 DIAGNOSIS — R7989 Other specified abnormal findings of blood chemistry: Secondary | ICD-10-CM

## 2023-10-20 DIAGNOSIS — M726 Necrotizing fasciitis: Principal | ICD-10-CM | POA: Diagnosis present

## 2023-10-20 DIAGNOSIS — Z8719 Personal history of other diseases of the digestive system: Secondary | ICD-10-CM | POA: Diagnosis not present

## 2023-10-20 DIAGNOSIS — E8779 Other fluid overload: Secondary | ICD-10-CM | POA: Diagnosis not present

## 2023-10-20 DIAGNOSIS — I509 Heart failure, unspecified: Secondary | ICD-10-CM | POA: Diagnosis not present

## 2023-10-20 DIAGNOSIS — L97429 Non-pressure chronic ulcer of left heel and midfoot with unspecified severity: Secondary | ICD-10-CM | POA: Diagnosis not present

## 2023-10-20 DIAGNOSIS — I70244 Atherosclerosis of native arteries of left leg with ulceration of heel and midfoot: Secondary | ICD-10-CM | POA: Diagnosis not present

## 2023-10-20 DIAGNOSIS — I70243 Atherosclerosis of native arteries of left leg with ulceration of ankle: Secondary | ICD-10-CM | POA: Diagnosis not present

## 2023-10-20 DIAGNOSIS — G2581 Restless legs syndrome: Secondary | ICD-10-CM | POA: Diagnosis present

## 2023-10-20 DIAGNOSIS — J189 Pneumonia, unspecified organism: Secondary | ICD-10-CM | POA: Diagnosis not present

## 2023-10-20 DIAGNOSIS — I2510 Atherosclerotic heart disease of native coronary artery without angina pectoris: Secondary | ICD-10-CM | POA: Diagnosis present

## 2023-10-20 DIAGNOSIS — I351 Nonrheumatic aortic (valve) insufficiency: Secondary | ICD-10-CM | POA: Diagnosis not present

## 2023-10-20 DIAGNOSIS — Z79899 Other long term (current) drug therapy: Secondary | ICD-10-CM

## 2023-10-20 DIAGNOSIS — Z6837 Body mass index (BMI) 37.0-37.9, adult: Secondary | ICD-10-CM | POA: Diagnosis not present

## 2023-10-20 DIAGNOSIS — J810 Acute pulmonary edema: Secondary | ICD-10-CM | POA: Diagnosis not present

## 2023-10-20 DIAGNOSIS — I5043 Acute on chronic combined systolic (congestive) and diastolic (congestive) heart failure: Secondary | ICD-10-CM | POA: Diagnosis not present

## 2023-10-20 DIAGNOSIS — I82402 Acute embolism and thrombosis of unspecified deep veins of left lower extremity: Secondary | ICD-10-CM | POA: Diagnosis not present

## 2023-10-20 DIAGNOSIS — I25118 Atherosclerotic heart disease of native coronary artery with other forms of angina pectoris: Secondary | ICD-10-CM | POA: Diagnosis not present

## 2023-10-20 DIAGNOSIS — Z89512 Acquired absence of left leg below knee: Secondary | ICD-10-CM | POA: Diagnosis not present

## 2023-10-20 DIAGNOSIS — Z4781 Encounter for orthopedic aftercare following surgical amputation: Secondary | ICD-10-CM | POA: Diagnosis not present

## 2023-10-20 DIAGNOSIS — R6520 Severe sepsis without septic shock: Secondary | ICD-10-CM | POA: Diagnosis not present

## 2023-10-20 LAB — CBC
HCT: 22.6 % — ABNORMAL LOW (ref 36.0–46.0)
Hemoglobin: 7.4 g/dL — ABNORMAL LOW (ref 12.0–15.0)
MCH: 28.7 pg (ref 26.0–34.0)
MCHC: 32.7 g/dL (ref 30.0–36.0)
MCV: 87.6 fL (ref 80.0–100.0)
Platelets: 209 10*3/uL (ref 150–400)
RBC: 2.58 MIL/uL — ABNORMAL LOW (ref 3.87–5.11)
RDW: 14.6 % (ref 11.5–15.5)
WBC: 10.4 10*3/uL (ref 4.0–10.5)
nRBC: 0 % (ref 0.0–0.2)

## 2023-10-20 LAB — BASIC METABOLIC PANEL WITH GFR
Anion gap: 10 (ref 5–15)
BUN: 26 mg/dL — ABNORMAL HIGH (ref 6–20)
CO2: 19 mmol/L — ABNORMAL LOW (ref 22–32)
Calcium: 7.2 mg/dL — ABNORMAL LOW (ref 8.9–10.3)
Chloride: 103 mmol/L (ref 98–111)
Creatinine, Ser: 1.34 mg/dL — ABNORMAL HIGH (ref 0.44–1.00)
GFR, Estimated: 47 mL/min — ABNORMAL LOW (ref >60)
Glucose, Bld: 167 mg/dL — ABNORMAL HIGH (ref 70–99)
Potassium: 3.2 mmol/L — ABNORMAL LOW (ref 3.5–5.1)
Sodium: 132 mmol/L — ABNORMAL LOW (ref 135–145)

## 2023-10-20 LAB — TROPONIN I (HIGH SENSITIVITY): Troponin I (High Sensitivity): 532 ng/L (ref <18)

## 2023-10-20 LAB — HIV ANTIBODY (ROUTINE TESTING W REFLEX): HIV Screen 4th Generation wRfx: NONREACTIVE

## 2023-10-20 LAB — GLUCOSE, CAPILLARY: Glucose-Capillary: 159 mg/dL — ABNORMAL HIGH (ref 70–99)

## 2023-10-20 LAB — LACTIC ACID, PLASMA: Lactic Acid, Venous: 1.2 mmol/L (ref 0.5–1.9)

## 2023-10-20 MED ORDER — ACETAMINOPHEN 650 MG RE SUPP: 650.0000 mg | Freq: Four times a day (QID) | RECTAL | Status: DC | PRN

## 2023-10-20 MED ORDER — ALBUMIN HUMAN 25 % IV SOLN
25.0000 g | INTRAVENOUS | Status: AC
  Administered 2023-10-20: 25 g via INTRAVENOUS
  Filled 2023-10-20: qty 100

## 2023-10-20 MED ORDER — ASPIRIN 81 MG PO TBEC: 81.0000 mg | DELAYED_RELEASE_TABLET | Freq: Every day | ORAL | Status: DC

## 2023-10-20 MED ORDER — FERROUS SULFATE 325 (65 FE) MG PO TABS
325.0000 mg | ORAL_TABLET | Freq: Every day | ORAL | Status: DC
  Administered 2023-10-21 – 2023-10-29 (×8): 325 mg via ORAL
  Filled 2023-10-20 (×8): qty 1

## 2023-10-20 MED ORDER — INSULIN GLARGINE-YFGN 100 UNIT/ML ~~LOC~~ SOLN
10.0000 [IU] | Freq: Every day | SUBCUTANEOUS | Status: DC
  Administered 2023-10-20 – 2023-10-28 (×9): 10 [IU] via SUBCUTANEOUS
  Filled 2023-10-20 (×10): qty 0.1

## 2023-10-20 MED ORDER — LEVOTHYROXINE SODIUM 75 MCG PO TABS
150.0000 ug | ORAL_TABLET | Freq: Every day | ORAL | Status: DC
  Administered 2023-10-21 – 2023-10-29 (×9): 150 ug via ORAL
  Filled 2023-10-20 (×9): qty 2

## 2023-10-20 MED ORDER — LACTATED RINGERS IV SOLN: INTRAVENOUS | Status: AC

## 2023-10-20 MED ORDER — SODIUM CHLORIDE 0.9% FLUSH: 3.0000 mL | INTRAVENOUS | Status: DC | PRN

## 2023-10-20 MED ORDER — SODIUM CHLORIDE 0.9 % IV SOLN
2.0000 g | Freq: Two times a day (BID) | INTRAVENOUS | Status: DC
  Administered 2023-10-21 – 2023-10-22 (×3): 2 g via INTRAVENOUS
  Filled 2023-10-20 (×3): qty 12.5

## 2023-10-20 MED ORDER — PANTOPRAZOLE SODIUM 40 MG PO TBEC
40.0000 mg | DELAYED_RELEASE_TABLET | Freq: Two times a day (BID) | ORAL | Status: DC
  Administered 2023-10-20 – 2023-10-29 (×17): 40 mg via ORAL
  Filled 2023-10-20 (×17): qty 1

## 2023-10-20 MED ORDER — SODIUM CHLORIDE 0.9 % IV SOLN: 250.0000 mL | INTRAVENOUS | Status: DC | PRN

## 2023-10-20 MED ORDER — ACETAMINOPHEN 325 MG PO TABS
650.0000 mg | ORAL_TABLET | Freq: Four times a day (QID) | ORAL | Status: DC | PRN
  Administered 2023-10-21: 650 mg via ORAL
  Filled 2023-10-20: qty 2

## 2023-10-20 MED ORDER — HEPARIN (PORCINE) 25000 UT/250ML-% IV SOLN
1350.0000 [IU]/h | INTRAVENOUS | Status: DC
  Administered 2023-10-21: 1400 [IU]/h via INTRAVENOUS
  Administered 2023-10-21: 1100 [IU]/h via INTRAVENOUS
  Filled 2023-10-20 (×2): qty 250

## 2023-10-20 MED ORDER — SODIUM CHLORIDE 0.9 % IV SOLN: 12.5000 mg | Freq: Four times a day (QID) | INTRAVENOUS | Status: DC | PRN

## 2023-10-20 MED ORDER — ONDANSETRON HCL 4 MG PO TABS
4.0000 mg | ORAL_TABLET | Freq: Four times a day (QID) | ORAL | Status: DC | PRN
Start: 2023-10-20 — End: 2023-10-20

## 2023-10-20 MED ORDER — INSULIN ASPART 100 UNIT/ML IJ SOLN
0.0000 [IU] | Freq: Every day | INTRAMUSCULAR | Status: DC
  Administered 2023-10-22: 2 [IU] via SUBCUTANEOUS

## 2023-10-20 MED ORDER — VANCOMYCIN HCL IN DEXTROSE 1-5 GM/200ML-% IV SOLN
1000.0000 mg | INTRAVENOUS | Status: DC
  Administered 2023-10-21 – 2023-10-22 (×2): 1000 mg via INTRAVENOUS
  Filled 2023-10-20 (×3): qty 200

## 2023-10-20 MED ORDER — ONDANSETRON HCL 4 MG/2ML IJ SOLN: 4.0000 mg | Freq: Four times a day (QID) | INTRAMUSCULAR | Status: DC | PRN

## 2023-10-20 MED ORDER — SENNOSIDES-DOCUSATE SODIUM 8.6-50 MG PO TABS: 1.0000 | ORAL_TABLET | Freq: Every evening | ORAL | Status: DC | PRN

## 2023-10-20 MED ORDER — INSULIN ASPART 100 UNIT/ML IJ SOLN
0.0000 [IU] | Freq: Three times a day (TID) | INTRAMUSCULAR | Status: DC
  Administered 2023-10-21 – 2023-10-22 (×2): 1 [IU] via SUBCUTANEOUS
  Administered 2023-10-22: 2 [IU] via SUBCUTANEOUS
  Administered 2023-10-23: 1 [IU] via SUBCUTANEOUS
  Administered 2023-10-24: 2 [IU] via SUBCUTANEOUS
  Administered 2023-10-26 – 2023-10-27 (×2): 5 [IU] via SUBCUTANEOUS
  Administered 2023-10-28: 1 [IU] via SUBCUTANEOUS

## 2023-10-20 MED ORDER — HEPARIN SODIUM (PORCINE) 5000 UNIT/ML IJ SOLN: 5000.0000 [IU] | Freq: Three times a day (TID) | INTRAMUSCULAR | Status: DC

## 2023-10-20 MED ORDER — SODIUM CHLORIDE 0.9% FLUSH: 3.0000 mL | Freq: Two times a day (BID) | INTRAVENOUS | Status: DC

## 2023-10-20 MED ORDER — PANTOPRAZOLE SODIUM 40 MG PO TBEC: 40.0000 mg | DELAYED_RELEASE_TABLET | Freq: Every day | ORAL | Status: DC

## 2023-10-20 MED ORDER — SODIUM CHLORIDE 0.9% IV SOLUTION: Freq: Once | INTRAVENOUS | Status: AC

## 2023-10-20 MED ORDER — SODIUM CHLORIDE 0.9 % IV SOLN: 2.0000 g | Freq: Two times a day (BID) | INTRAVENOUS | Status: DC

## 2023-10-20 MED ORDER — VANCOMYCIN HCL 2000 MG/400ML IV SOLN
2000.0000 mg | Freq: Once | INTRAVENOUS | Status: DC
  Filled 2023-10-20: qty 400

## 2023-10-20 MED ORDER — LACTATED RINGERS IV BOLUS: 1000.0000 mL | Freq: Once | INTRAVENOUS | Status: DC

## 2023-10-20 MED ORDER — VANCOMYCIN HCL IN DEXTROSE 1-5 GM/200ML-% IV SOLN: 1000.0000 mg | INTRAVENOUS | Status: DC

## 2023-10-20 MED ORDER — ATORVASTATIN CALCIUM 40 MG PO TABS
40.0000 mg | ORAL_TABLET | Freq: Every day | ORAL | Status: DC
  Administered 2023-10-21 – 2023-10-29 (×8): 40 mg via ORAL
  Filled 2023-10-20 (×8): qty 1

## 2023-10-20 MED ORDER — DIPHENHYDRAMINE HCL 50 MG/ML IJ SOLN: 50.0000 mg | Freq: Three times a day (TID) | INTRAMUSCULAR | Status: DC | PRN

## 2023-10-20 MED ORDER — POTASSIUM CHLORIDE CRYS ER 20 MEQ PO TBCR
40.0000 meq | EXTENDED_RELEASE_TABLET | Freq: Once | ORAL | Status: AC
  Administered 2023-10-20: 40 meq via ORAL
  Filled 2023-10-20: qty 2

## 2023-10-20 MED ORDER — TRIMETHOBENZAMIDE HCL 100 MG/ML IM SOLN: 200.0000 mg | Freq: Three times a day (TID) | INTRAMUSCULAR | Status: DC | PRN

## 2023-10-20 MED ORDER — CLOPIDOGREL BISULFATE 75 MG PO TABS
75.0000 mg | ORAL_TABLET | Freq: Every day | ORAL | Status: DC
  Administered 2023-10-21 – 2023-10-29 (×8): 75 mg via ORAL
  Filled 2023-10-20 (×8): qty 1

## 2023-10-20 MED ORDER — INSULIN DEGLUDEC 100 UNIT/ML ~~LOC~~ SOPN: 10.0000 [IU] | PEN_INJECTOR | Freq: Every day | SUBCUTANEOUS | Status: DC

## 2023-10-20 NOTE — Progress Notes (Signed)
 PHARMACY - ANTICOAGULATION CONSULT NOTE  Pharmacy Consult for Heparin  Indication: LLE ischemia  Allergies  Allergen Reactions   Trental  [Pentoxifylline ] Nausea And Vomiting   Vibramycin [Doxycycline] Nausea And Vomiting   Penicillins Rash    Tolerated cephalosporins 07/2022     Patient Measurements: Weight: 100.6 kg (221 lb 12.5 oz)   HEPARIN  DW (KG): 78 kg   Vital Signs: Temp: 99 F (37.2 C) (06/22 1956) Temp Source: Oral (06/22 1956) BP: 99/55 (06/22 1956) Pulse Rate: 81 (06/22 1956)  Labs: No results for input(s): HGB, HCT, PLT, APTT, LABPROT, INR, HEPARINUNFRC, HEPRLOWMOCWT, CREATININE, CKTOTAL, CKMB, TROPONINIHS in the last 72 hours.  Estimated Creatinine Clearance: 47.7 mL/min (A) (by C-G formula based on SCr of 1.52 mg/dL (H)).   Medical History: Past Medical History:  Diagnosis Date   Anemia    Aortic stenosis    Arthritis    CHF (congestive heart failure) (HCC)    Coronary artery disease    Diabetes mellitus without complication (HCC)    type 2   GERD (gastroesophageal reflux disease)    Heart murmur    History of blood transfusion    Hypercholesteremia    Hypertension    Hypothyroidism    Mitral regurgitation    Myocardial infarction (HCC)    Peripheral vascular disease (HCC)    PONV (postoperative nausea and vomiting)    Thyroid disease     Medications:  Scheduled:   [START ON 10/21/2023] atorvastatin   40 mg Oral Daily   [START ON 10/21/2023] clopidogrel   75 mg Oral Daily   [START ON 10/21/2023] ferrous sulfate   325 mg Oral Q breakfast   heparin   5,000 Units Subcutaneous Q8H   insulin  aspart  0-5 Units Subcutaneous QHS   [START ON 10/21/2023] insulin  aspart  0-6 Units Subcutaneous TID WC   insulin  glargine-yfgn  10 Units Subcutaneous QHS   [START ON 10/21/2023] levothyroxine   150 mcg Oral QAC breakfast   pantoprazole   40 mg Oral BID   sodium chloride  flush  3 mL Intravenous Q12H   sodium chloride  flush  3 mL Intravenous  Q12H   Infusions:   sodium chloride      albumin human     ceFEPime  (MAXIPIME ) IV     lactated ringers      lactated ringers       Assessment:  58 yo F with hx PAD s/p vascular bypass surgeries and bilateral transmetatarsal amputations, presented to [facility] with LLE pain on 6/20.  Request transfer to [facility] for VVS evaluation.  Pharmacy consulted to start IV heparin .  Of note, pt was on SQ heparin  for VTE ppx - last dose 6/20 @ 1600.  (Pt refused subsequent doses).    Physician has requested no bolus and lower goal given patient's ongoing anemia.  Hgb 8.4 at [facility].  Goal of Therapy:  Heparin  level 0.3-0.5 units/ml Monitor platelets by anticoagulation protocol: Yes   Plan:  Start heparin  at 1100 units/hr. Heparin  level in 8 hours. Heparin  level and CBC daily while on heparin .   Suzen Lovelace, Pharm.D., BCPS Clinical Pharmacist  **Pharmacist phone directory can be found on [email or web address] listed under [facility].  10/20/2023 10:03 PM

## 2023-10-20 NOTE — H&P (Addendum)
 History and Physical    Kayla Schmitt FMW:[id number] DOB: 06-Feb-1966 DOA: 10/20/2023  PCP: [name], NP   Patient coming from: Home   Chief Complaint: Left-sided leg pain, lethargy, nausea, vomiting and fever  HPI:  Kayla Schmitt is a 58 y.o. female with medical history significant of multivessel CAD, aortic stenosis, essential hypertension, heart failure with reduced ejection fraction less than 40 to 45%, history of PAD s/p bilateral stent placement and s/p bilateral metatarsal amputation, chronic anemia, hyperlipidemia, hypothyroidism, history of GI bleed secondary to AVM who has been initially brought to [facility] for lethargy accompanied by patient's mother. At presentation to emergency department patient found to be lethargic, febrile and hypotensive. Sepsis protocol initiated and placed on vancomycin  and cefepime .  Blood cultures growing out gram-positive cocci but no clear source of infection.  Patient reported to be alert and oriented x 3 and complaining of pain in left leg.  Patient underwent CT of the chest abdomen and pelvis without clear abnormality as well as MRI of stump.  Doppler ultrasound also negative for any signs for DVT.  Noted to have purple spotty area of discoloration in the left leg.  Previously patient had ABI in left leg that was normal in May of 1.01, but now reported to be 0.63.  Transfer requested for need of vascular services.  Blood pressure still reported to be soft and patient was in the ICU, but not on pressors and O2 saturations maintained on room air. ED physician consulted vascular surgeon at [facility] recommended to transfer for further evaluation by vascular surgery.  At [facility] around patient has been treated for sepsis with endorgan dysfunction including elevated lactic acid and AKI, peripheral vascular disease with increased pain of the left lower extremity, AKI, altered mental status, DM type II and elevated  troponin secondary to demand ischemia.  Patient also received 1 unit of blood transfusion as hemoglobin dropped 8.2-7.  No GI bleed at outside hospital.  Checking stat labs include CBC, BMP, lactic acid, blood cultures.    Significant labs in the ED: Lab Orders         Culture, blood (Routine X 2) w Reflex to ID Panel         CBC         Basic metabolic panel         Lactic acid, plasma         HIV Antibody (routine testing w rflx)         Urinalysis, Routine w reflex microscopic -Urine, Clean Catch         CBC         Comprehensive metabolic panel         Glucose, capillary         Heparin  level (unfractionated)         Hepatic function panel         Lactic acid, plasma       Review of Systems:  Review of Systems  Constitutional:  Negative for chills, fever, malaise/fatigue and weight loss.  Respiratory:  Negative for cough, sputum production and shortness of breath.   Cardiovascular:  Negative for chest pain and palpitations.  Gastrointestinal:  Negative for abdominal pain, blood in stool, constipation, diarrhea, heartburn, melena, nausea and vomiting.  Genitourinary:  Negative for dysuria.  Musculoskeletal:  Negative for falls, joint pain and myalgias.       Left-sided lower extremity pain  Skin:  Negative for itching and rash.  Endo/Heme/Allergies:  Negative for environmental allergies and polydipsia. Does not bruise/bleed easily.  Psychiatric/Behavioral:  The patient is not nervous/anxious.     Past Medical History:  Diagnosis Date   Anemia    Aortic stenosis    Arthritis    CHF (congestive heart failure) (HCC)    Coronary artery disease    Diabetes mellitus without complication (HCC)    type 2   GERD (gastroesophageal reflux disease)    Heart murmur    History of blood transfusion    Hypercholesteremia    Hypertension    Hypothyroidism    Mitral regurgitation    Myocardial infarction (HCC)    Peripheral vascular disease (HCC)    PONV (postoperative nausea  and vomiting)    Thyroid disease     Past Surgical History:  Procedure Laterality Date   ABDOMINAL AORTOGRAM W/LOWER EXTREMITY Bilateral 01/14/2020   Procedure: ABDOMINAL AORTOGRAM W/LOWER EXTREMITY;  Surgeon: Gretta Lonni PARAS, MD;  [facility]: [facility] INVASIVE CV LAB;  Service: Cardiovascular;  Laterality: Bilateral;   ABDOMINAL AORTOGRAM W/LOWER EXTREMITY N/A 07/05/2022   Procedure: ABDOMINAL AORTOGRAM W/LOWER EXTREMITY;  Surgeon: Gretta Lonni PARAS, MD;  Location: [facility] INV[facility] CV LAB;  Service: Cardiovascular;  Laterality: N/A;   ABDOMINAL AORTOGRAM W/LOWER EXTREMITY N/A 08/02/2022   Procedure: ABDOMINAL AORTOGRAM W/LOWER EXTREMITY;  Surgeon: Gretta Lonni PARAS, MD;  Location: [facility] INVAS[facility] CV LAB;  Service: Cardiovascular;  Laterality: N/A;   ABDOMINAL AORTOGRAM W/LOWER EXTREMITY N/A 08/06/2022   Procedure: ABDOMINAL AORTOGRAM W/LOWER EXTREMITY;  Surgeon: Gretta Lonni PARAS, MD;  Location: [facility];  Service: Cardiovascular;  Laterality: N/A;   AMPUTATION Bilateral 07/20/2022   Procedure: AMPUTATION RIGHT GREAT TOE AND SECOND TOE, AMPUTATION LEFT GREAT TOE;  Surgeon: Harden Jerona GAILS, MD;  Location: [facility] OR;  Service: Orthopedics;  Laterality: Bilateral;   AMPUTATION Bilateral 08/08/2022   Procedure: BILATERAL TRANSMETATARSAL AMPUTATION;  Surgeon: Harden Jerona GAILS, MD;  Location: [facility] OR;  Service: Orthopedics;  Laterality: Bilateral;   AMPUTATION TOE     BIOPSY  04/06/2023   Procedure: BIOPSY;  Surgeon: Cataleya Elspeth SQUIBB, MD;  Location: [facility] ENDOSCOPY;  Service: Gastroenterology;;   COLONOSCOPY WITH PROPOFOL  N/A 04/06/2023   Procedure: COLONOSCOPY WITH PROPOFOL ;  Surgeon: Jenascia Elspeth SQUIBB, MD;  Location: [facility] ENDOSCOPY;  Service: Gastroenterology;  Laterality: N/A;   ESOPHAGOGASTRODUODENOSCOPY (EGD) WITH PROPOFOL  N/A 04/06/2023   Procedure: ESOPHAGOGASTRODUODENOSCOPY (EGD) WITH PROPOFOL ;  Surgeon: Chasmine Elspeth SQUIBB, MD;  Location: [facility] ENDOSCOPY;  Service: Gastroenterology;  Laterality: N/A;    GIVENS CAPSULE STUDY N/A 04/06/2023   Procedure: GIVENS CAPSULE STUDY;  Surgeon: Keiry Elspeth SQUIBB, MD;  Location: [facility] ENDOSCOPY;  Service: Gastroenterology;  Laterality: N/A;   PERIPHERAL VASCULAR ATHERECTOMY Right 01/14/2020   Procedure: PERIPHERAL VASCULAR ATHERECTOMY;  Surgeon: Gretta Lonni PARAS, MD;  Location: [facility] INVASIVE CV LAB;  Service: Cardiovascular;  Laterality: Right;  sfa   PERIPHERAL VASCULAR INTERVENTION Right 01/14/2020   Procedure: PERIPHERAL VASCULAR INTERVENTION;  Surgeon: Gretta Lonni PARAS, MD;  [facility];  Service: Cardiovascular;  Laterality: Right;  sfa stent    PERIPHERAL VASCULAR INTERVENTION  07/05/2022   Procedure: PERIPHERAL VASCULAR INTERVENTION;  Surgeon: Gretta Lonni PARAS, MD;  [facility];  Service: Cardiovascular;;   PERIPHERAL VASCULAR INTERVENTION  08/02/2022   Procedure: PERIPHERAL VASCULAR INTERVENTION;  Surgeon: Gretta Lonni PARAS, MD;  [facility];  Service: Cardiovascular;;   PERIPHERAL VASCULAR INTERVENTION  08/06/2022   Procedure: PERIPHERAL VASCULAR INTERVENTION;  Surgeon: Gretta Lonni PARAS, MD;  Location: [facility]  INVASIVE CV LAB;  Service: Cardiovascular;;   PERIPHERAL VASCULAR THROMBECTOMY  08/02/2022   Procedure: PERIPHERAL VASCULAR THROMBECTOMY;  Surgeon: Gretta Lonni PARAS, MD;  [facility] LAB;  Service: Cardiovascular;;   RIGHT/LEFT HEART CATH AND CORONARY ANGIOGRAPHY N/A 08/23/2022   Procedure: RIGHT/LEFT HEART CATH AND CORONARY ANGIOGRAPHY;  Surgeon: Dann Candyce RAMAN, MD;  Location: [facility] INVASIVE CV LAB;  Service: Cardiovascular;  Laterality: N/A;   SKIN SPLIT GRAFT Right 03/18/2020   Procedure: SKIN GRAFTING RIGHT FOOT ULCER;  Surgeon: Harden Jerona GAILS, MD;  Location: [facility] OR;  Service: Orthopedics;  Laterality: Right;   STUMP REVISION Right 11/30/2022   Procedure: REVISION RIGHT TRANSMETATARSAL AMPUTATION;  Surgeon: Harden Jerona GAILS, MD;  Location: [facility] OR;  Service: Orthopedics;   Laterality: Right;   TEE WITHOUT CARDIOVERSION N/A 08/27/2022   Procedure: TRANSESOPHAGEAL ECHOCARDIOGRAM;  Surgeon: Hobart Powell BRAVO, MD;  Location: [facility] INVASIVE CV LAB;  Service: Cardiovascular;  Laterality: N/A;   WISDOM TOOTH EXTRACTION       reports that she quit smoking about 6 years ago. Her smoking use included cigarettes. She has never used smokeless tobacco. She reports that she does not currently use alcohol. She reports current drug use. Drug: Marijuana.  Allergies  Allergen Reactions   Trental  [Pentoxifylline ] Nausea And Vomiting   Vibramycin [Doxycycline] Nausea And Vomiting   Penicillins Rash    Tolerated cephalosporins 07/2022     Family History  Problem Relation Age of Onset   Diabetes Other    CAD Other     Prior to Admission medications   Medication Sig Start Date End Date Taking? Authorizing Provider  acetaminophen  (TYLENOL ) 500 MG tablet Take 1,000 mg by mouth 2 (two) times daily as needed for moderate pain (pain score 4-6), headache or fever.    [provider]  Alcohol Swabs (PHARMACIST CHOICE ALCOHOL) PADS Apply topically. 04/09/23   [provider]  aspirin  81 MG EC tablet Take 81 mg by mouth daily.    [provider]  atorvastatin  (LIPITOR) 40 MG tablet Take 40 mg by mouth daily. 12/14/19   [provider]  clopidogrel  (PLAVIX ) 75 MG tablet TAKE 1 TABLET BY MOUTH EVERY DAY WITH BREAKFAST 01/28/23   Sheree Penne Lonni, MD  diphenhydrAMINE HCl, Sleep, (ZZZQUIL) 25 MG CAPS Take 25 mg by mouth at bedtime as needed (Sleep).    [provider]  ferrous sulfate  325 (65 FE) MG EC tablet Take 1 tablet (325 mg total) by mouth daily with breakfast. 04/07/23 04/06/24  Fairy Frames, MD  furosemide  (LASIX ) 40 MG tablet Take 1 tablet (40 mg total) by mouth 2 (two) times daily. 08/27/22   Leotis Bogus, MD  gabapentin  (NEURONTIN ) 300 MG capsule Take 300 mg by mouth at bedtime.    [provider]  insulin   degludec (TRESIBA  FLEXTOUCH) 100 UNIT/ML FlexTouch Pen Inject 10 Units into the skin at bedtime. Patient taking differently: Inject 16 Units into the skin at bedtime. 01/15/20   Danford, Lonni SQUIBB, MD  levothyroxine  (SYNTHROID ) 150 MCG tablet Take 150 mcg by mouth daily before breakfast. 12/02/19   [provider]  metoprolol  succinate (TOPROL -XL) 25 MG 24 hr tablet Take 0.5 tablets (12.5 mg total) by mouth daily. 08/28/22   Leotis Bogus, MD  nitroGLYCERIN  (NITROSTAT ) 0.4 MG SL tablet Place 1 tablet (0.4 mg total) under the tongue every 5 (five) minutes x 3 doses as needed for chest pain (if no relief after 3rd dose proceed to ED or call 911). 11/06/2022-New 11/06/22  Mallipeddi, Vishnu P, MD  oxyCODONE  (ROXICODONE ) 5 MG immediate release tablet Take 1 tablet (5 mg total) by mouth every 6 (six) hours as needed for severe pain (pain score 7-10). 10/15/23   Francesca Elsie CROME, MD  pantoprazole  (PROTONIX ) 40 MG tablet Take 40 mg by mouth daily. 12/21/19   [provider]  potassium chloride  (KLOR-CON ) 20 MEQ packet Take 40 mEq by mouth daily. 04/07/23   Fairy Frames, MD  promethazine  (PHENERGAN ) 25 MG tablet Take 1 tablet (25 mg total) by mouth every 6 (six) hours as needed for nausea or vomiting. 10/15/23   Francesca Elsie CROME, MD     Physical Exam: Vitals:   10/20/23 1956 10/20/23 2031  BP: (!) 99/55 (!) 108/55  Pulse: 81 78  Resp: 18   Temp: 99 F (37.2 C)   TempSrc: Oral   SpO2: 98% 98%  Weight: 100.6 kg   Height: 5' 4 (1.626 m)     Physical Exam Constitutional:      Appearance: She is ill-appearing.  HENT:     Mouth/Throat:     Mouth: Mucous membranes are moist.   Eyes:     Pupils: Pupils are equal, round, and reactive to light.    Cardiovascular:     Rate and Rhythm: Normal rate and regular rhythm.     Pulses: Normal pulses.     Heart sounds: Normal heart sounds.  Pulmonary:     Effort: Pulmonary effort is normal.     Breath sounds: Normal breath  sounds.  Abdominal:     Palpations: Abdomen is soft.   Musculoskeletal:     Cervical back: Neck supple.     Comments: Right lower extremity stump looking clean Left-sided lower extremity multiple purple spot, purple discoloration of the stump area, multiple bulla formation and large bulla around the ankle area.   Skin:    General: Skin is dry.     Capillary Refill: Capillary refill takes less than 2 seconds.     Findings: Erythema and lesion present.   Neurological:     Mental Status: She is alert and oriented to person, place, and time.      Media Information   Document Information  Photos    10/20/2023 21:00  Attached To:  Hospital Encounter on 10/20/23  Source Information  Sheri Prows, MD  [facility]    Media Information   Document Information  Photos    10/20/2023 21:00  Attached To:  Hospital Encounter on 10/20/23  Source Information  Lee Kingfisher, MD  [facility]    Labs on Admission: I have personally reviewed following labs and imaging studies  CBC: Recent Labs  Lab 10/15/23 1649 10/20/23 2200  WBC 8.0 10.4  NEUTROABS 6.9  --   HGB 10.2* 7.4*  HCT 31.1* 22.6*  MCV 89.6 87.6  PLT 266 209   Basic Metabolic Panel: Recent Labs  Lab 10/15/23 1649 10/20/23 2200  NA 139 132*  K 3.5 3.2*  CL 98 103  CO2 27 19*  GLUCOSE 216* 167*  BUN 45* 26*  CREATININE 1.52* 1.34*  CALCIUM  9.2 7.2*   GFR: Estimated Creatinine Clearance: 54.1 mL/min (A) (by C-G formula based on SCr of 1.34 mg/dL (H)). Liver Function Tests: Recent Labs  Lab 10/15/23 1649  AST 19  ALT 12  ALKPHOS 111  BILITOT 1.2  PROT 7.9  ALBUMIN 3.2*   Recent Labs  Lab 10/15/23 1649  LIPASE 28   No results for input(s): AMMONIA in the last 168  hours. Coagulation Profile: No results for input(s): INR, PROTIME in the last 168 hours. Cardiac Enzymes: Recent Labs  Lab 10/20/23 2200  TROPONINIHS 532*   BNP (last 3 results) Recent Labs     04/03/23 0905  BNP 1,033.9*   HbA1C: No results for input(s): HGBA1C in the last 72 hours. CBG: Recent Labs  Lab 10/20/23 2038  GLUCAP 159*   Lipid Profile: No results for input(s): CHOL, HDL, LDLCALC, TRIG, CHOLHDL, LDLDIRECT in the last 72 hours. Thyroid Function Tests: No results for input(s): TSH, T4TOTAL, FREET4, T3FREE, THYROIDAB in the last 72 hours. Anemia Panel: No results for input(s): VITAMINB12, FOLATE, FERRITIN, TIBC, IRON, RETICCTPCT in the last 72 hours. Urine analysis:    Component Value Date/Time   COLORURINE AMBER (A) 10/15/2023 2131   APPEARANCEUR HAZY (A) 10/15/2023 2131   LABSPEC 1.024 10/15/2023 2131   PHURINE 5.0 10/15/2023 2131   GLUCOSEU 50 (A) 10/15/2023 2131   HGBUR SMALL (A) 10/15/2023 2131   BILIRUBINUR NEGATIVE 10/15/2023 2131   KETONESUR NEGATIVE 10/15/2023 2131   PROTEINUR 100 (A) 10/15/2023 2131   NITRITE NEGATIVE 10/15/2023 2131   LEUKOCYTESUR NEGATIVE 10/15/2023 2131    Radiological Exams on Admission: I have personally reviewed images No results found.   EKG: EKG showed normal sinus rhythm heart rate 77, prolonged QT 486 msec, normal ST and T wave.    Assessment/Plan: Principal Problem:   Sepsis (HCC) Active Problems:   PVD (peripheral vascular disease) (HCC)   AKI (acute kidney injury) (HCC)   Diffuse pain in left lower extremity   Elevated troponin   Hypokalemia   Insulin  dependent type 2 diabetes mellitus (HCC)   Hypothyroidism   Acute on chronic anemia   History of GI bleed   History of coronary artery disease   Prolonged QT interval    Assessment and Plan: Sepsis-unknown source of infection at this time -At presentation to ED patient found hypotensive, tachycardic, febrile 102 F, complaining about left lower extremity pain, nausea and vomiting.  She also found to have altered mental status however with fluid resuscitation, and after treating with broad-spectrum antibiotic IV  and cefepime  mentation has been improved significantly.  Patient was admitted to [facility] however never required any pressor support. -CT chest abdomen pelvis did not reveal any foci of infection.  UA unremarkable - MRI of the left foot no evidence of osteomyelitis, abscess or focal area of infection. -Outside facility patient has been treated with IV vancomycin  and cefepime . -Previous vasculature 6/21 showed gram-positive cocci however unable to find any source of infection. -Physical exam showed multiple small and large bulla formation around the ankle and purple discoloration of the left-sided foot stump. - Repeating MRI of the of the left lower extremity to rule out abscess/gas formation/development of osteomyelitis. - Checking stat CBC, BMP, lactic acid, troponin, EKG. - Continue broad-spectrum antibiotic coverage. - Rechecking blood culture. - In the setting of soft blood pressure giving 1 L of LR bolus, and starting maintenance fluid LR 125 cc/h.  Peripheral vascular disease s/p bilateral lower extremity stent Diffuse left lower extremity pain  -Patient presenting to emergency department at outside facility complaining of left lower extremity pain. -History of peripheral vascular disease status post bilateral transmetatarsal amputation and stenting. ((Most recently on 08/02/2022 she underwent left SFA and above-knee popliteal percutaneous thrombectomy with JETI and additional angioplasty and stenting into the above-knee popliteal artery for an occluded distal SFA stent with tissue loss. She then on 08/06/2022 underwent laser arthrectomy of the right SFA above-knee  popliteal with DCB for in-stent stenosis. She had bilateral TMA's with Dr. Harden on 08/08/2022)) -MRI obtained did not show any evidence o of osteomyelitis. -DVT ultrasound no evidence of blood clot. - Given patient has significant vasculopath with bilateral lower extremity stent, history of CAD, concern for development of worsening  PAD. -ABI of the left lower extremity shows loss of tibial waveform on ABI. - Physical exam showed left-sided lower extremity purple discoloration and there is a large bullae formation around the ankle.  Tender palpation and warm to touch. -Based on physical exam concern for early development of left lower extremity ischemia. - Starting IV heparin  drip with low-dose without any bolus.  (Patient does not have any active GI bleed however hemoglobin dropped to 7 at outside facility status post monitor blood transfusion afterward improved to  8.1.) -Continue Plavix  and Lipitor.  Holding aspirin  -Vascular surgeon Dr. Sheree has been accepted this patient to transfer to [facility].  Informed overnight Dr. Sheree stated that patient possibly angiogram of the lower extremity later this week.   Acute kidney injury -At initial presentation to ED creatinine was 2.32 which has been improved around 1.7.  Prerenal acute kidney injury in the setting of sepsis and hypotension.  Continue maintenance fluid.  Avoid nephrotoxic agent.  Continue with renal function.  Elevated troponin-secondary demand ischemia -Outside facility troponin was elevated 696>576. however there is no repeat troponin check and unable to find any EKG.  Currently patient denies any chest pain. -EKG showed normal sinus rhythm heart rate 77, prolonged QT 486 msec, normal ST and T wave. Rechecking troponin level. - Elevated troponin secondary to demand ischemia in the context of sepsis.. Addendum - Troponin [id number].  Patient denies any chest pain. -Will obtain echocardiogram to assess any wall motion abnormality. - Patient being already on heparin  drip for the mx of concern of ischemia of the left lower extremity. -Continue Plavix  and Lipitor.   History of hypokalemia -Checking BMP to follow-up with potassium level  Acute on chronic anemia History of GI bleed secondary to AVM -Baseline hemoglobin around 10.  Hemoglobin dropped to 7  outside facility.  Patient did not had any GI bleed.  Received 1 unit of blood transfusion OSH so far. - Checking CBC.   -Continue monitor development of any GI bleed given patient being starting on low-dose heparin  drip and on Plavix . Addendum - Hemoglobin dropped to 7.4.  Transfusing 1 unit of blood tonight.  If patient develops any active GI bleeding need to hold the heparin  drip.   Insulin -dependent DM type II -Continue Lantus , sliding scale insulin  coverage.  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  History of CAD -Continue  Plavix , Lipitor.  Prolonged QT -EKG showed normal sinus rhythm heart rate 77, prolonged QT 491 msec, normal ST and T wave.  Need to avoid further QT prolonging medication.   DVT prophylaxis:  IV heparin  gtts Code Status:  Full Code Diet: Heart healthy and carb modified diet Family Communication:   Family was present at bedside, at the time of interview. Opportunity was given to ask question and all questions were answered satisfactorily.  Disposition Plan: Pending repeat MRI of the left lower extremity.  Starting low-dose heparin  drip.  Continue monitor hemoglobin in the setting of heparin  drip. Consults: Neurosurgery Admission status:   Inpatient, Step Down Unit  Severity of Illness: The appropriate patient status for this patient is  INPATIENT. Inpatient status is judged to be reasonable and necessary in order to provide the required intensity of service to ensure the patient's safety. The patient's presenting symptoms, physical exam findings, and initial radiographic and laboratory data in the context of their chronic comorbidities is felt to place them at high risk for further clinical deterioration. Furthermore, it is not anticipated that the patient will be medically stable for discharge from the hospital within 2 midnights  of admission.   * I certify that at the point of admission it is my clinical judgment that the patient will require inpatient hospital care spanning beyond 2 midnights from the point of admission due to high intensity of service, high risk for further deterioration and high frequency of surveillance required.DEWAINE    Zayon Trulson, MD [facility]  How to contact the [facility] Attending or Consulting provider 7A - 7P or covering provider during after hours 7P -7A, for this patient.  Check the care team in [facility] and look for a) attending/consulting [facility] provider listed and b) the [facility] team listed Log into [email or web address] and use [place]'s universal password to access. If you do not have the password, please contact the hospital operator. Locate the [facility] provider you are looking for under [facility] and page to a number that you can be directly reached. If you still have difficulty reaching the provider, please page the [facility] (Director on Call) for the Hospitalists listed on amion for assistance.  10/20/2023, 11:11 PM

## 2023-10-20 NOTE — Progress Notes (Addendum)
 Pharmacy Antibiotic Note  Kayla Schmitt is a 58 y.o. female admitted on 10/20/2023 with sepsis, staph aureus bacteremia.  Patient was admitted 6/20 to [facility] and started on Vancomycin  and Cefepime .  Pharmacy has been consulted to continue these antibiotics at [facility].  Labs pending.  Last SCr 1.52 on 10/15/23.  Will follow renal fxn and adjust dose as needed.  Regimen from [facility]: Cefepime  2gm IV q12h > q8h (last dose 6/22 @ 1720) Vanc 2gm load, then 1500mg  IV q24h (last dose 6/22 @ 1440)  Called and spoke with [facility], Consulting civil engineer at [facility] 6/20 BCx: 2/2 staph aureus (sens pending) 6/20 UCx: no growth F   Plan: Decrease Vancomycin  to 1gm q24h - next dose due 6/23 @ 1400 SCr 1.52, Vd 0.5, eAUC 584 Change Cefepime  2gm IV q12h - next dose due 6/23 @ 0600 Follow-up [facility] BCx data [phone]) and renal fxn.    Weight: 100.6 kg (221 lb 12.5 oz)  Temp (24hrs), Avg:99 F (37.2 C), Min:99 F (37.2 C), Max:99 F (37.2 C)  Recent Labs  Lab 10/15/23 1649  WBC 8.0  CREATININE 1.52*    Estimated Creatinine Clearance: 47.7 mL/min (A) (by C-G formula based on SCr of 1.52 mg/dL (H)).    Allergies  Allergen Reactions   Trental  [Pentoxifylline ] Nausea And Vomiting   Vibramycin [Doxycycline] Nausea And Vomiting   Penicillins Rash    Tolerated cephalosporins 07/2022     Antimicrobials this admission: Vanc 6/20 >>  Cefepime  6/20 >>   Dose adjustments this admission:   Microbiology results: 6/20 BCx ([facility]):  2/2 staph aureus 6/20 UCx ([facility]):  NG F 6/22 BCx: pending   Thank you for allowing pharmacy to be a part of this patient's care.  [employer], Pharm.D., BCPS Clinical Pharmacist  **Pharmacist phone directory can be found on [email or web address] listed under [facility].  10/20/2023 10:10 PM

## 2023-10-20 NOTE — Plan of Care (Signed)
 Acute on chronic anemia - Baseline hemoglobin was around 10.  Received 1 unit of blood transfusion outside hospital after that hemoglobin improved to 8.1 again trended down to 7.4 tonight. -Currently does not have any active source of bleeding. - Type and screen and transfusing 1 unit of blood. -Given patient is on IV heparin  drip need to monitor for development of any GI bleed.

## 2023-10-21 ENCOUNTER — Other Ambulatory Visit: Payer: Self-pay

## 2023-10-21 ENCOUNTER — Inpatient Hospital Stay (HOSPITAL_COMMUNITY)

## 2023-10-21 DIAGNOSIS — A419 Sepsis, unspecified organism: Secondary | ICD-10-CM | POA: Diagnosis not present

## 2023-10-21 DIAGNOSIS — I96 Gangrene, not elsewhere classified: Secondary | ICD-10-CM

## 2023-10-21 DIAGNOSIS — R7989 Other specified abnormal findings of blood chemistry: Secondary | ICD-10-CM

## 2023-10-21 DIAGNOSIS — R6520 Severe sepsis without septic shock: Secondary | ICD-10-CM

## 2023-10-21 DIAGNOSIS — I2489 Other forms of acute ischemic heart disease: Secondary | ICD-10-CM | POA: Diagnosis not present

## 2023-10-21 DIAGNOSIS — N179 Acute kidney failure, unspecified: Secondary | ICD-10-CM | POA: Diagnosis not present

## 2023-10-21 DIAGNOSIS — B9562 Methicillin resistant Staphylococcus aureus infection as the cause of diseases classified elsewhere: Secondary | ICD-10-CM | POA: Insufficient documentation

## 2023-10-21 DIAGNOSIS — M726 Necrotizing fasciitis: Secondary | ICD-10-CM

## 2023-10-21 LAB — CBC
HCT: 30.1 % — ABNORMAL LOW (ref 36.0–46.0)
Hemoglobin: 10 g/dL — ABNORMAL LOW (ref 12.0–15.0)
MCH: 29.5 pg (ref 26.0–34.0)
MCHC: 33.2 g/dL (ref 30.0–36.0)
MCV: 88.8 fL (ref 80.0–100.0)
Platelets: 238 10*3/uL (ref 150–400)
RBC: 3.39 MIL/uL — ABNORMAL LOW (ref 3.87–5.11)
RDW: 14.6 % (ref 11.5–15.5)
WBC: 13.1 10*3/uL — ABNORMAL HIGH (ref 4.0–10.5)
nRBC: 0 % (ref 0.0–0.2)

## 2023-10-21 LAB — GLUCOSE, CAPILLARY
Glucose-Capillary: 123 mg/dL — ABNORMAL HIGH (ref 70–99)
Glucose-Capillary: 137 mg/dL — ABNORMAL HIGH (ref 70–99)
Glucose-Capillary: 164 mg/dL — ABNORMAL HIGH (ref 70–99)
Glucose-Capillary: 145 mg/dL — ABNORMAL HIGH (ref 70–99)

## 2023-10-21 LAB — MAGNESIUM: Magnesium: 1.9 mg/dL (ref 1.7–2.4)

## 2023-10-21 LAB — ECHOCARDIOGRAM COMPLETE
AV Mean grad: 17 mmHg
AV Peak grad: 28.3 mmHg
Ao pk vel: 2.66 m/s
Area-P 1/2: 3.4 cm2
Height: 64 in
S' Lateral: 4.2 cm
Weight: 3548.52 [oz_av]

## 2023-10-21 LAB — HEPATIC FUNCTION PANEL
ALT: 42 U/L (ref 0–44)
AST: 81 U/L — ABNORMAL HIGH (ref 15–41)
Albumin: <1.5 g/dL — ABNORMAL LOW (ref 3.5–5.0)
Alkaline Phosphatase: 143 U/L — ABNORMAL HIGH (ref 38–126)
Bilirubin, Direct: 0.3 mg/dL — ABNORMAL HIGH (ref 0.0–0.2)
Indirect Bilirubin: 0.3 mg/dL (ref 0.3–0.9)
Total Bilirubin: 0.6 mg/dL (ref 0.0–1.2)
Total Protein: 5 g/dL — ABNORMAL LOW (ref 6.5–8.1)

## 2023-10-21 LAB — BASIC METABOLIC PANEL WITH GFR
Anion gap: 9 (ref 5–15)
BUN: 19 mg/dL (ref 6–20)
CO2: 21 mmol/L — ABNORMAL LOW (ref 22–32)
Calcium: 7.5 mg/dL — ABNORMAL LOW (ref 8.9–10.3)
Chloride: 104 mmol/L (ref 98–111)
Creatinine, Ser: 1.25 mg/dL — ABNORMAL HIGH (ref 0.44–1.00)
GFR, Estimated: 51 mL/min — ABNORMAL LOW (ref >60)
Glucose, Bld: 170 mg/dL — ABNORMAL HIGH (ref 70–99)
Potassium: 4 mmol/L (ref 3.5–5.1)
Sodium: 134 mmol/L — ABNORMAL LOW (ref 135–145)

## 2023-10-21 LAB — PREPARE RBC (CROSSMATCH)

## 2023-10-21 LAB — SURGICAL PCR SCREEN
MRSA, PCR: NEGATIVE
Staphylococcus aureus: NEGATIVE

## 2023-10-21 LAB — TROPONIN I (HIGH SENSITIVITY): Troponin I (High Sensitivity): 501 ng/L (ref <18)

## 2023-10-21 LAB — HEPARIN LEVEL (UNFRACTIONATED)
Heparin Unfractionated: 0.26 [IU]/mL — ABNORMAL LOW (ref 0.30–0.70)
Heparin Unfractionated: 0.27 [IU]/mL — ABNORMAL LOW (ref 0.30–0.70)

## 2023-10-21 LAB — LACTIC ACID, PLASMA: Lactic Acid, Venous: 1.1 mmol/L (ref 0.5–1.9)

## 2023-10-21 MED ORDER — MAGNESIUM SULFATE 2 GM/50ML IV SOLN: 2.0000 g | Freq: Once | INTRAVENOUS | Status: DC

## 2023-10-21 MED ORDER — OXYCODONE-ACETAMINOPHEN 5-325 MG PO TABS
1.0000 | ORAL_TABLET | ORAL | Status: DC | PRN
  Administered 2023-10-21 (×3): 1 via ORAL
  Administered 2023-10-22 – 2023-10-23 (×6): 2 via ORAL
  Administered 2023-10-23: 1 via ORAL
  Administered 2023-10-24 (×3): 2 via ORAL
  Administered 2023-10-25 (×3): 1 via ORAL
  Administered 2023-10-25: 2 via ORAL
  Administered 2023-10-26 (×3): 1 via ORAL
  Administered 2023-10-27 (×2): 2 via ORAL
  Administered 2023-10-27: 1 via ORAL
  Administered 2023-10-27 – 2023-10-29 (×5): 2 via ORAL
  Filled 2023-10-21 (×2): qty 2
  Filled 2023-10-21: qty 1
  Filled 2023-10-21 (×4): qty 2
  Filled 2023-10-21 (×2): qty 1
  Filled 2023-10-21: qty 2
  Filled 2023-10-21: qty 1
  Filled 2023-10-21 (×2): qty 2
  Filled 2023-10-21: qty 1
  Filled 2023-10-21 (×2): qty 2
  Filled 2023-10-21: qty 1
  Filled 2023-10-21: qty 2
  Filled 2023-10-21: qty 1
  Filled 2023-10-21 (×5): qty 2
  Filled 2023-10-21: qty 1
  Filled 2023-10-21: qty 2
  Filled 2023-10-21: qty 1
  Filled 2023-10-21: qty 2

## 2023-10-21 MED ORDER — POTASSIUM CHLORIDE CRYS ER 20 MEQ PO TBCR: 40.0000 meq | EXTENDED_RELEASE_TABLET | Freq: Once | ORAL | Status: DC

## 2023-10-21 MED ORDER — CHLORHEXIDINE GLUCONATE CLOTH 2 % EX PADS
6.0000 | MEDICATED_PAD | Freq: Once | CUTANEOUS | Status: AC
  Administered 2023-10-22: 6 via TOPICAL

## 2023-10-21 MED ORDER — SODIUM CHLORIDE 0.9% FLUSH
10.0000 mL | Freq: Two times a day (BID) | INTRAVENOUS | Status: DC
  Administered 2023-10-21 – 2023-10-25 (×8): 10 mL
  Administered 2023-10-26: 20 mL
  Administered 2023-10-27: 10 mL

## 2023-10-21 MED ORDER — HYDROMORPHONE HCL 1 MG/ML IJ SOLN
0.5000 mg | INTRAMUSCULAR | Status: DC | PRN
  Administered 2023-10-22 – 2023-10-28 (×12): 0.5 mg via INTRAVENOUS
  Filled 2023-10-21 (×12): qty 1

## 2023-10-21 MED ORDER — SODIUM CHLORIDE 0.9% FLUSH: 10.0000 mL | INTRAVENOUS | Status: DC | PRN

## 2023-10-21 NOTE — Consult Note (Cosign Needed)
 I have seen and examined the patient. I have personally reviewed the clinical findings, laboratory findings, microbiological data and imaging studies. The assessment and treatment plan was discussed with the Nurse Practitioner. I agree with her/his recommendations except following additions/corrections.  58 Y O Female with prior h/o DM2, CAD, AS, CHFrEF, PAD s/p vascular intervention with b/l stents, b/l TMA with stump revision in 11/2022 transferred from OSH for concerns of sepsis from left leg/ischemic injury for vascular evaluation. Presented to OSH with AMS. Found to have   Exam-adult female sitting in the bed, not in acute distress, HEENT WNL, heart lung and abdomen within normal limit, awake, alert and oriented grossly nonfocal, no rashes, bilateral TMA, right foot stump with no signs of infection.  Left foot stump with multiple discoloration purplish with no open wound or drainage, multiple bullae with 1 large bulla around the ankle, erythema.  No signs of septic peripheral joint or vertebral tenderness   # MRSA bacteremia  # Ischemic Injury to Left TMA site/leg, possible necrotizing fasciitis  - seen by Vascular and Ortho, planned for Left BKA  Plan Will dc cefepime  after Left BKA today  Fu blood cx and TTE. Needs TEE if TTE negative Monitor for metastatic sites of infection Monitor CBC and CMP on antibiotics Will keep on contact precautions   I spent 82 minutes involved in face-to-face and non-face-to-face activities for this patient on the day of the visit. Professional time spent includes the following activities: Preparing to see the patient (review of tests), Obtaining and reviewing separately obtained history (admission/discharge record), Performing a medically appropriate examination and evaluation , Ordering medications/labs, referring and communicating with other health care professionals, Documenting clinical information in the EMR, Independently interpreting results (not  separately reported), Communicating results to the patient/family/caregiver, Counseling and educating the patient/family/caregiver and Care coordination (not separately reported).    [facility]    Date of Admission:  10/20/2023     Total days of antibiotics 1               Reason for Consult: Osteomyelitis / Bacteremia   Referring Provider:  Dr. Willette Primary Care Provider: [name], NP   ASSESSMENT:  Kayla Schmitt is a 58 year old female with peripheral vascular disease and type 2 diabetes presenting to outside hospital with GI symptoms and lower left extremity pain and found to have MRSA bacteremia and suspected necrotizing fasciitis.  Plan for below-knee amputation on 10/22/2023 with possible return to the OR on 10/23/2023 for debridement as indicated.  Blood cultures from 10/20/2023 without growth to date.  She does have a midline in place in the right upper extremity that pending blood cultures may or may not need a line holiday.  Continue broad-spectrum coverage with vancomycin  and cefepime  through surgery and likely narrow to daptomycin.  Therapeutic drug monitoring of vancomycin  levels and renal function.  Contact precautions for MRSA.  Optimize protein intake with albumin less than 1.5.  Discussed importance of continued blood sugar control.  Remaining medical and supportive care per internal medicine.    PLAN:  Continue current dose of vancomycin  and cefepime . Surgery per vascular surgery and orthopedics with planned below-knee amputation on 10/22/2023. Monitor blood culture for clearance of bacteremia. TTE to check for endocarditis and may need TEE. Therapeutic drug monitoring of renal function and vancomycin  levels. Contact precautions for MRSA. Optimize protein intake for healing with low albumin Remaining medical and supportive care per internal medicine.   Principal Problem:  Sepsis (HCC) Active Problems:   MRSA bacteremia   PVD  (peripheral vascular disease) (HCC)   Insulin  dependent type 2 diabetes mellitus (HCC)   Hypothyroidism   Acute on chronic anemia   AKI (acute kidney injury) (HCC)   Diffuse pain in left lower extremity   History of GI bleed   Elevated troponin   Hypokalemia   History of coronary artery disease   Prolonged QT interval   Gangrene of left foot (HCC)    atorvastatin   40 mg Oral Daily   clopidogrel   75 mg Oral Daily   ferrous sulfate   325 mg Oral Q breakfast   insulin  aspart  0-5 Units Subcutaneous QHS   insulin  aspart  0-6 Units Subcutaneous TID WC   insulin  glargine-yfgn  10 Units Subcutaneous QHS   levothyroxine   150 mcg Oral QAC breakfast   pantoprazole   40 mg Oral BID   potassium chloride   40 mEq Oral Once   sodium chloride  flush  10-40 mL Intracatheter Q12H     HPI: Kayla Schmitt is a 58 y.o. female with previous medical history of Type 2 diabetes, hypertension, peripheral artery disease status post bilateral stent placements status post bilateral metatarsal amputation in April 2024, and hypothyroidism transferred from outside hospital with sepsis of unclear source and decreased ankle-brachial index in the left leg with loss of tibial waveform on ABI.  Kayla Schmitt presented to [facility] with altered mental status.  Of note she recently saw her vascular surgeon about 1 month prior and had bilateral ultrasounds and vascular evaluations showing patency of the left stent.  Met SIRS criteria and found to have MRSA bacteremia with acute kidney injury.  MRI left foot with no abscess, osteomyelitis, or focal area of infection or concern and CT abdomen/pelvis/chest with no discrete foci of infection.  Increased discoloration of the plantar surface of the left foot/stump.  Transferred to [facility] for vascular surgery evaluation.  Afebrile on admission with white blood cell count of 10.4.  Blood cultures drawn on arrival are without growth to date.   MRI left tibia/fibula with  blistering extending along the posterior medial ankle; no loculated subcutaneous fluid collection; and diffuse intramuscular edema of the anterior and posterior compartment about musculature.  Findings concerning for cellulitis or fasciitis. Orthopedics and vascular surgery evaluated with concern for necrotizing fasciitis. Scheduled for OR tomorrow. On broad spectrum coverage with vancomycin  and cefepime .  Per Kayla Schmitt's mother symptoms started with GI symptoms of nausea and vomiting along with tenderness of the left lower extremity.  While in the hospital noted to have increasing discoloration of the left lower extremity prior to being transferred.  Review of Systems: Review of Systems  Constitutional:  Negative for chills, fever and weight loss.  Respiratory:  Negative for cough, shortness of breath and wheezing.   Cardiovascular:  Negative for chest pain and leg swelling.  Gastrointestinal:  Negative for abdominal pain, constipation, diarrhea, nausea and vomiting.  Skin:  Negative for rash.     Past Medical History:  Diagnosis Date   Anemia    Aortic stenosis    Arthritis    CHF (congestive heart failure) (HCC)    Coronary artery disease    Diabetes mellitus without complication (HCC)    type 2   GERD (gastroesophageal reflux disease)    Heart murmur    History of blood transfusion    Hypercholesteremia    Hypertension    Hypothyroidism    Mitral regurgitation    Myocardial infarction (  HCC)    Peripheral vascular disease (HCC)    PONV (postoperative nausea and vomiting)    Thyroid disease    Past Surgical History:  Procedure Laterality Date   ABDOMINAL AORTOGRAM W/LOWER EXTREMITY Bilateral 01/14/2020   Procedure: ABDOMINAL AORTOGRAM W/LOWER EXTREMITY;  Surgeon: Gretta Lonni PARAS, MD;  Location: [facility];  Service: Cardiovascular;  Laterality: Bilateral;   ABDOMINAL AORTOGRAM W/LOWER EXTREMITY N/A 07/05/2022   Procedure: ABDOMINAL AORTOGRAM W/LOWER EXTREMITY;   Surgeon: Gretta Lonni PARAS, MD;  Location: [facility];  Service: Cardiovascular;  Laterality: N/A;   ABDOMINAL AORTOGRAM W/LOWER EXTREMITY N/A 08/02/2022   Procedure: ABDOMINAL AORTOGRAM W/LOWER EXTREMITY;  Surgeon: Gretta Lonni PARAS, MD;  Location: [facility];  Service: Cardiovascular;  Laterality: N/A;   ABDOMINAL AORTOGRAM W/LOWER EXTREMITY N/A 08/06/2022   Procedure: ABDOMINAL AORTOGRAM W/LOWER EXTREMITY;  Surgeon: Gretta Lonni PARAS, MD;  Location: [facility];  Service: Cardiovascular;  Laterality: N/A;   AMPUTATION Bilateral 07/20/2022   Procedure: AMPUTATION RIGHT GREAT TOE AND SECOND TOE, AMPUTATION LEFT GREAT TOE;  Surgeon: Harden Jerona GAILS, MD;  Location: [facility] OR;  Service: Orthopedics;  Laterality: Bilateral;   AMPUTATION Bilateral 08/08/2022   Procedure: BILATERAL TRANSMETATARSAL AMPUTATION;  Surgeon: Harden Jerona GAILS, MD;  Location: [facility] OR;  Service: Orthopedics;  Laterality: Bilateral;   AMPUTATION TOE     BIOPSY  04/06/2023   Procedure: BIOPSY;  Surgeon: Misty Elspeth SQUIBB, MD;  Location: [facility] ENDOSCOPY;  Service: Gastroenterology;;   COLONOSCOPY WITH PROPOFOL  N/A 04/06/2023   Procedure: COLONOSCOPY WITH PROPOFOL ;  Surgeon: Cassity Elspeth SQUIBB, MD;  Location: [facility] ENDOSCOPY;  Service: Gastroenterology;  Laterality: N/A;   ESOPHAGOGASTRODUODENOSCOPY (EGD) WITH PROPOFOL  N/A 04/06/2023   Procedure: ESOPHAGOGASTRODUODENOSCOPY (EGD) WITH PROPOFOL ;  Surgeon: Chieko Elspeth SQUIBB, MD;  Location: [facility] ENDOSCOPY;  Service: Gastroenterology;  Laterality: N/A;   GIVENS CAPSULE STUDY N/A 04/06/2023   Procedure: GIVENS CAPSULE STUDY;  Surgeon: Sanja Elspeth SQUIBB, MD;  Location: [facility] ENDOSCOPY;  Service: Gastroenterology;  Laterality: N/A;   PERIPHERAL VASCULAR ATHERECTOMY Right 01/14/2020   Procedure: PERIPHERAL VASCULAR ATHERECTOMY;  Surgeon: Gretta Lonni PARAS, MD;  Location: [facility] INVASIVE CV LAB;  Service: Cardiovascular;  Laterality: Right;  sfa   PERIPHERAL VASCULAR INTERVENTION  Right 01/14/2020   Procedure: PERIPHERAL VASCULAR INTERVENTION;  Surgeon: Gretta Lonni PARAS, MD;  Location: [facility];  Service: Cardiovascular;  Laterality: Right;  sfa stent    PERIPHERAL VASCULAR INTERVENTION  07/05/2022   Procedure: PERIPHERAL VASCULAR INTERVENTION;  Surgeon: Gretta Lonni PARAS, MD;  Location: [facility];  Service: Cardiovascular;;   PERIPHERAL VASCULAR INTERVENTION  08/02/2022   Procedure: PERIPHERAL VASCULAR INTERVENTION;  Surgeon: Gretta Lonni PARAS, MD;  Location: [facility];  Service: Cardiovascular;;   PERIPHERAL VASCULAR INTERVENTION  08/06/2022   Procedure: PERIPHERAL VASCULAR INTERVENTION;  Surgeon: Gretta Lonni PARAS, MD;  Location: [facility] INVASIVE CV LAB;  Service: Cardiovascular;;   PERIPHERAL VASCULAR THROMBECTOMY  08/02/2022   Procedure: PERIPHERAL VASCULAR THROMBECTOMY;  Surgeon: Gretta Lonni PARAS, MD;  Location: [facility];  Service: Cardiovascular;;   RIGHT/LEFT HEART CATH AND CORONARY ANGIOGRAPHY N/A 08/23/2022   Procedure: RIGHT/LEFT HEART CATH AND CORONARY ANGIOGRAPHY;  Surgeon: Dann Candyce RAMAN, MD;  Location: [facility] INVASIVE CV LAB;  Service: Cardiovascular;  Laterality: N/A;   SKIN SPLIT GRAFT Right 03/18/2020   Procedure: SKIN GRAFTING RIGHT FOOT ULCER;  Surgeon: Harden Jerona GAILS, MD;  Location: [facility] OR;  Service: Orthopedics;  Laterality: Right;   STUMP REVISION Right 11/30/2022  Procedure: REVISION RIGHT TRANSMETATARSAL AMPUTATION;  Surgeon: Harden Jerona GAILS, MD;  Location: [facility] OR;  Service: Orthopedics;  Laterality: Right;   TEE WITHOUT CARDIOVERSION N/A 08/27/2022   Procedure: TRANSESOPHAGEAL ECHOCARDIOGRAM;  Surgeon: Hobart Powell BRAVO, MD;  Location: [facility] INVASIVE CV LAB;  Service: Cardiovascular;  Laterality: N/A;   WISDOM TOOTH EXTRACTION      Social History   Tobacco Use   Smoking status: Former    Current packs/day: 0.00    Types: Cigarettes    Quit date: 01/2017    Years since quitting: 6.7   Smokeless tobacco:  Never  Vaping Use   Vaping status: Never Used  Substance Use Topics   Alcohol use: Not Currently   Drug use: Yes    Types: Marijuana    Comment: on ocassion- Last time - 11/13/22    Family History  Problem Relation Age of Onset   Diabetes Other    CAD Other     Allergies  Allergen Reactions   Trental  [Pentoxifylline ] Nausea And Vomiting   Vibramycin [Doxycycline] Nausea And Vomiting   Penicillins Rash    Tolerated cephalosporins 07/2022     OBJECTIVE: Blood pressure 121/65, pulse 91, temperature 99.2 F (37.3 C), temperature source Oral, resp. rate 16, height 5' 4 (1.626 m), weight 100.6 kg, SpO2 96%.  Physical Exam Constitutional:      General: She is not in acute distress.    Appearance: She is well-developed.   Cardiovascular:     Rate and Rhythm: Normal rate and regular rhythm.     Heart sounds: Normal heart sounds.  Pulmonary:     Effort: Pulmonary effort is normal.     Breath sounds: Normal breath sounds.   Skin:    General: Skin is warm and dry.   Neurological:     Mental Status: She is alert and oriented to person, place, and time.            Lab Results Lab Results  Component Value Date   WBC 13.1 (H) 10/21/2023   HGB 10.0 (L) 10/21/2023   HCT 30.1 (L) 10/21/2023   MCV 88.8 10/21/2023   PLT 238 10/21/2023    Lab Results  Component Value Date   CREATININE 1.34 (H) 10/20/2023   BUN 26 (H) 10/20/2023   NA 132 (L) 10/20/2023   K 3.2 (L) 10/20/2023   CL 103 10/20/2023   CO2 19 (L) 10/20/2023    Lab Results  Component Value Date   ALT 42 10/20/2023   AST 81 (H) 10/20/2023   ALKPHOS 143 (H) 10/20/2023   BILITOT 0.6 10/20/2023     Microbiology: Recent Results (from the past 240 hours)  Culture, blood (Routine X 2) w Reflex to ID Panel     Status: None (Preliminary result)   Collection Time: 10/20/23 10:00 PM   Specimen: BLOOD LEFT HAND  Result Value Ref Range Status   Specimen Description BLOOD LEFT HAND  Final   Special Requests    Final    BOTTLES DRAWN AEROBIC AND ANAEROBIC Blood Culture adequate volume   Culture   Final    NO GROWTH < 12 HOURS Performed at [facility], [address] [place], [place] [address]    Report Status PENDING  Incomplete  Culture, blood (Routine X 2) w Reflex to ID Panel     Status: None (Preliminary result)   Collection Time: 10/20/23 10:02 PM   Specimen: BLOOD LEFT ARM  Result Value Ref Range Status   Specimen Description  BLOOD LEFT ARM  Final   Special Requests   Final    BOTTLES DRAWN AEROBIC AND ANAEROBIC Blood Culture adequate volume   Culture   Final    NO GROWTH < 12 HOURS Performed at [facility], [address], [place], [place] [address]    Report Status PENDING  Incomplete   Imaging ECHOCARDIOGRAM COMPLETE Result Date: 10/21/2023    ECHOCARDIOGRAM REPORT   Patient Name:   MERTHA CLYATT [facility] Date of Exam: 10/21/2023 Medical Rec #:  [id number]              Height:       64.0 in Accession #:    [id number]             Weight:       221.8 lb Date of Birth:  [birth year]               BSA:          2.044 m Patient Age:    57 years               BP:           125/63 mmHg Patient Gender: F                      HR:           87 bpm. Exam Location:  Inpatient Procedure: 2D Echo, Cardiac Doppler and Color Doppler (Both Spectral and Color            Flow Doppler were utilized during procedure). Indications:    Elevated troponin  History:        Patient has prior history of Echocardiogram examinations, most                 recent 04/04/2023.  Sonographer:    Tinnie Gosling RDCS Referring Phys: [phone] SUBRINA SUNDIL IMPRESSIONS  1. Left ventricular ejection fraction, by estimation, is 50 to 55%. The left ventricle has low normal function. The left ventricle has no regional wall motion abnormalities. Left ventricular diastolic parameters are consistent with Grade II diastolic dysfunction (pseudonormalization).  2. Right ventricular systolic function was not well visualized. The  right ventricular size is not well visualized.  3. The mitral valve is degenerative. Moderate mitral valve regurgitation. No evidence of mitral stenosis.  4. The aortic valve is normal in structure. There is severe calcifcation of the aortic valve. There is severe thickening of the aortic valve. Aortic valve regurgitation is mild. Mild to moderate aortic valve stenosis. Aortic valve mean gradient measures  17.0 mmHg. Aortic valve Vmax measures 2.66 m/s.  5. The inferior vena cava is normal in size with greater than 50% respiratory variability, suggesting right atrial pressure of 3 mmHg. Conclusion(s)/Recommendation(s): Extremely limited study with poor visualization. If endocarditis is suspected a TEE would be beneficial. FINDINGS  Left Ventricle: Left ventricular ejection fraction, by estimation, is 50 to 55%. The left ventricle has low normal function. The left ventricle has no regional wall motion abnormalities. The left ventricular internal cavity size was normal in size. There is no left ventricular hypertrophy. Left ventricular diastolic parameters are consistent with Grade II diastolic dysfunction (pseudonormalization).  LV Wall Scoring: Inferolateral wall hypokinesis. Right Ventricle: The right ventricular size is not well visualized. Right vetricular wall thickness was not well visualized. Right ventricular systolic function was not well visualized. Left Atrium: Left atrial size was normal in size. Right Atrium: Right atrial size was normal  in size. Pericardium: There is no evidence of pericardial effusion. Mitral Valve: The mitral valve is degenerative in appearance. Mild mitral annular calcification. Moderate mitral valve regurgitation. No evidence of mitral valve stenosis. Tricuspid Valve: The tricuspid valve is normal in structure. Tricuspid valve regurgitation is not demonstrated. No evidence of tricuspid stenosis. Aortic Valve: The aortic valve is normal in structure. There is severe calcifcation of  the aortic valve. There is severe thickening of the aortic valve. Aortic valve regurgitation is mild. Mild to moderate aortic stenosis is present. Aortic valve mean gradient measures 17.0 mmHg. Aortic valve peak gradient measures 28.3 mmHg. Pulmonic Valve: The pulmonic valve was normal in structure. Pulmonic valve regurgitation is not visualized. No evidence of pulmonic stenosis. Aorta: The aortic root is normal in size and structure. Venous: The inferior vena cava is normal in size with greater than 50% respiratory variability, suggesting right atrial pressure of 3 mmHg. IAS/Shunts: No atrial level shunt detected by color flow Doppler.  LEFT VENTRICLE PLAX 2D LVIDd:         5.30 cm   Diastology LVIDs:         4.20 cm   LV e' medial:    6.20 cm/s LV PW:         1.10 cm   LV E/e' medial:  15.9 LV IVS:        1.10 cm   LV e' lateral:   7.94 cm/s LVOT diam:     2.00 cm   LV E/e' lateral: 12.4 LVOT Area:     3.14 cm  LEFT ATRIUM         Index LA diam:    3.90 cm 1.91 cm/m  AORTIC VALVE AV Vmax:      266.00 cm/s AV Vmean:     182.000 cm/s AV VTI:       0.489 m AV Peak Grad: 28.3 mmHg AV Mean Grad: 17.0 mmHg  AORTA Ao Root diam: 2.40 cm Ao Asc diam:  3.30 cm MITRAL VALVE MV Area (PHT): 3.40 cm     SHUNTS MV Decel Time: 223 msec     Systemic Diam: 2.00 cm MV E velocity: 98.50 cm/s MV A velocity: 103.00 cm/s MV E/A ratio:  0.96 Morene Brownie Electronically signed by Morene Brownie Signature Date/Time: 10/21/2023/8:46:33 PM    Final    MR TIBIA FIBULA LEFT WO CONTRAST Result Date: 10/21/2023 CLINICAL DATA:  Left foot/leg pain blistering. Patient is status post bilateral transmetatarsal amputation. EXAM: MRI OF LOWER LEFT EXTREMITY WITHOUT CONTRAST TECHNIQUE: Multiplanar, multisequence MR imaging of the left tibia/fibula was performed. No intravenous contrast was administered. COMPARISON:  Left tibia/fibula radiographs dated 12/17/2021. FINDINGS: Bones/Joint/Cartilage No fracture or dislocation. Normal alignment. No  joint effusion. No marrow signal abnormality. Partially visualized left knee joint effusion. Ligaments Grossly intact. Soft tissue/ Muscles and Tendons Diffuse T2/STIR hyperintense signal of the anterior and posterior compartment musculature of the left shin/calf, most compatible with edema. Perifascial edema extending along the deep intramuscular fascia of the anterior and posterior compartment musculature as well as the deep to the medial gastrocnemius muscle. There is relatively diffuse mild-to-moderate fatty atrophy of the anterior and posterior compartment musculature. Diffuse circumferential subcutaneous edema. No discrete loculated subcutaneous fluid collection. Blistering extending along the posteromedial ankle, measuring up to 4.9 x 1.3 cm in axial dimension. Limited evaluation of the right shin/calf on large field-of-view coronal imaging demonstrates relatively diffuse intramuscular edema with less pronounced perifascial edema. IMPRESSION: 1. iffuse intramuscular edema of the anterior and posterior compartment  musculature of the left shin/calf extending from the knee through the ankle with overlying perifascial edema along the deep intramuscular fascia as well as deep to the medial gastrocnemius muscle. There is overlying diffuse circumferential subcutaneous edema. No loculated subcutaneous fluid collection. These findings are nonspecific. Cellulitis or fasciitis cannot be excluded, clinical correlation is recommended. Limited evaluation of the right shin/calf on large field-of-view coronal imaging demonstrates relatively diffuse intramuscular edema with less pronounced perifascial edema. 2. Blistering extending along the left posteromedial ankle. 3. Mild-to-moderate fatty atrophy of the left shin/calf anterior and posterior compartment musculature. Similar mild-to-moderate fatty atrophy of the right shin/calf is noted on large field-of-view coronal images. 4. No acute osseous abnormality. 5. Partially  visualized left knee joint effusion. Electronically Signed   By: Harrietta Sherry M.D.   On: 10/21/2023 13:00   VAS US  LOWER EXTREMITY ARTERIAL DUPLEX Result Date: 09/24/2023 LOWER EXTREMITY ARTERIAL DUPLEX STUDY Patient Name:  Amaryah Mallen  Date of Exam:   09/24/2023 Medical Rec #: [id number]               Accession #:    [id number] Date of Birth: 06-08-1965                Patient Gender: F Patient Age:   68 years Exam Location:  [address] Procedure:      VAS US  LOWER EXTREMITY ARTERIAL DUPLEX Referring Phys: LONNI GASKINS --------------------------------------------------------------------------------  Indications: Peripheral artery disease. High Risk Factors: Hypertension, hyperlipidemia, Diabetes, past history of                    smoking.  Vascular Interventions: 01/14/2020: Right SFA mechanical orbital atherectomy and                         balloon angioplasty. Right SFA stent placement. Right                         above-knee popliteal artery angioplasty.                         bilateral transmet amputation.                         08/02/2022 Left SFA and AK popliteal thrombectomy and                         additional angioplasty and stenting in the AK popliteal                         for distal SFA occlusion of stent. Current ABI:            Right ABI 0.77 left 1.01 Performing Technologist: Devere Dark RVT  Examination Guidelines: A complete evaluation includes B-mode imaging, spectral Doppler, color Doppler, and power Doppler as needed of all accessible portions of each vessel. Bilateral testing is considered an integral part of a complete examination. Limited examinations for reoccurring indications may be performed as noted.  +-----------+--------+-----+---------------+----------+--------+ RIGHT      PSV cm/sRatioStenosis       Waveform  Comments +-----------+--------+-----+---------------+----------+--------+ CFA Prox   163                         monophasic          +-----------+--------+-----+---------------+----------+--------+ CFA Distal 373  50-74% stenosismonophasic         +-----------+--------+-----+---------------+----------+--------+ DFA        74                          monophasic         +-----------+--------+-----+---------------+----------+--------+ SFA Prox   339          50-74% stenosismonophasic         +-----------+--------+-----+---------------+----------+--------+ SFA Mid    258          50-74% stenosismonophasic         +-----------+--------+-----+---------------+----------+--------+ SFA Distal 183                         monophasic         +-----------+--------+-----+---------------+----------+--------+ POP Prox   102                         monophasic         +-----------+--------+-----+---------------+----------+--------+ POP Mid    142                         monophasic         +-----------+--------+-----+---------------+----------+--------+ POP Distal 135                         monophasic         +-----------+--------+-----+---------------+----------+--------+ ATA Distal 96                          monophasic         +-----------+--------+-----+---------------+----------+--------+ PTA Distal 86                          monophasic         +-----------+--------+-----+---------------+----------+--------+ PERO Distal60                          monophasic         +-----------+--------+-----+---------------+----------+--------+  +-----------+--------+-----+--------+----------+--------+ LEFT       PSV cm/sRatioStenosisWaveform  Comments +-----------+--------+-----+--------+----------+--------+ CFA Prox   142                  biphasic           +-----------+--------+-----+--------+----------+--------+ POP Distal 139                  biphasic           +-----------+--------+-----+--------+----------+--------+ ATA Distal 69                    monophasic         +-----------+--------+-----+--------+----------+--------+ PTA Distal 101                  monophasicbrisk    +-----------+--------+-----+--------+----------+--------+ PERO Distal69                   monophasic         +-----------+--------+-----+--------+----------+--------+  Left Stent(s): +---------------+--------+--------+--------+--------+ SFA            PSV cm/sStenosisWaveformComments +---------------+--------+--------+--------+--------+ Prox to Stent  149             biphasic         +---------------+--------+--------+--------+--------+ Proximal Stent 127  biphasic         +---------------+--------+--------+--------+--------+ Mid Stent      133             biphasic         +---------------+--------+--------+--------+--------+ Distal Stent   63              biphasic         +---------------+--------+--------+--------+--------+ Distal to Stent101             biphasic         +---------------+--------+--------+--------+--------+    Summary: Right: 50-74% stenosis noted in the superficial femoral artery. Unable to appreciate SFA stent due to extensive calcification. Left: The SFA stent appears patent, however calcified segments noted throughout, may obscure higher velocity.  See table(s) above for measurements and observations. Electronically signed by Lonni Gaskins MD on 09/24/2023 at 2:38:37 PM.    Final    VAS US  ABI WITH/WO TBI Result Date: 09/24/2023  LOWER EXTREMITY DOPPLER STUDY Patient Name:  Azucena Dart  Date of Exam:   09/24/2023 Medical Rec #: [id number]               Accession #:    [id number] Date of Birth: 03/11/1966                Patient Gender: F Patient Age:   36 years Exam Location:  [address] Procedure:      VAS US  ABI WITH/WO TBI Referring Phys: LONNI GASKINS --------------------------------------------------------------------------------  Indications: Peripheral artery disease. High Risk          Hypertension, hyperlipidemia, Diabetes, past history of Factors:          smoking.  Vascular Interventions: 01/14/2020: Right SFA mechanical orbital atherectomy and                         balloon angioplasty. Right SFA stent placement. Right                         above-knee popliteal artery angioplasty.                         bilateral transmet amputation.                         08/02/2022 Left SFA and AK popliteal thrombectomy with                         additional angioplasty and stenting in tthe AK popliteal                         for occlusion of distal SFA stent. Comparison Study: Prior exam had bilateral monophasic waveforms and were non                   compressible. Todays waveforms on the left are biphasic and                   triphasic. Performing Technologist: Devere Dark RVT  Examination Guidelines: A complete evaluation includes at minimum, Doppler waveform signals and systolic blood pressure reading at the level of bilateral brachial, anterior tibial, and posterior tibial arteries, when vessel segments are accessible. Bilateral testing is considered an integral part of a complete examination. Photoelectric Plethysmograph (PPG) waveforms and toe systolic pressure readings are included as required and additional  duplex testing as needed. Limited examinations for reoccurring indications may be performed as noted.  ABI Findings: +---------+------------------+-----+----------+----------+ Right    Rt Pressure (mmHg)IndexWaveform  Comment    +---------+------------------+-----+----------+----------+ Brachial 157                                         +---------+------------------+-----+----------+----------+ PTA      126               0.77 monophasic           +---------+------------------+-----+----------+----------+ DP       87                0.53 monophasic           +---------+------------------+-----+----------+----------+ Great Toe                                  amputation +---------+------------------+-----+----------+----------+ +---------+------------------+-----+---------+----------+ Left     Lt Pressure (mmHg)IndexWaveform Comment    +---------+------------------+-----+---------+----------+ Brachial 164                                        +---------+------------------+-----+---------+----------+ PTA      165               1.01 triphasic           +---------+------------------+-----+---------+----------+ DP       147               0.90 biphasic            +---------+------------------+-----+---------+----------+ Great Toe                                amputation +---------+------------------+-----+---------+----------+ +-------+-----------+-----------+------------+------------+ ABI/TBIToday's ABIToday's TBIPrevious ABIPrevious TBI +-------+-----------+-----------+------------+------------+ Right  0.77       amputation [place]          amputation   +-------+-----------+-----------+------------+------------+ Left   1.01       amputation [place]          amputation   +-------+-----------+-----------+------------+------------+  Summary: Right: Resting right ankle-brachial index indicates moderate right lower extremity arterial disease. Left: Resting left ankle-brachial index is within normal range. *See table(s) above for measurements and observations.  Electronically signed by Lonni Gaskins MD on 09/24/2023 at 2:37:37 PM.    Final      I have personally spent 32  minutes involved in face-to-face and non-face-to-face activities for this patient on the day of the visit. Professional time spent includes the following activities: Preparing to see the patient (review of tests), reviewing separately obtained history (admission/discharge record), performing a medically appropriate examination and/or evaluation , Ordering medications/tests/procedures, communicating with other health care professionals, documenting clinical  information in the EMR, communicating results to the patient/family, counseling and educating the patient/family and care coordination    Cathlyn July, NP [facility]  10/21/2023  2:29 PM

## 2023-10-21 NOTE — Progress Notes (Signed)
 Plan for open left BKA tomorrow after discussion with Dr. Harden.  Patient has eaten.  Will make n.p.o. after midnight.  Consent order placed.  Discussed with patient and her mom at bedside.  Plan to leave BKA open with formalization and and further evaluation on Wednesday in the OR with Dr. Harden.  Lonni DOROTHA Gaskins, MD [facility] Office: [phone]   Lonni JINNY Gaskins

## 2023-10-21 NOTE — Progress Notes (Signed)
 PHARMACY - ANTICOAGULATION CONSULT NOTE  Pharmacy Consult for Heparin  Indication: LLE ischemia  Allergies  Allergen Reactions   Trental  [Pentoxifylline ] Nausea And Vomiting   Vibramycin [Doxycycline] Nausea And Vomiting   Penicillins Rash    Tolerated cephalosporins 07/2022     Patient Measurements: Height: 5' 4 (162.6 cm) Weight: 100.6 kg (221 lb 12.5 oz) IBW/kg (Calculated) : 54.7 HEPARIN  DW (KG): 78 HEPARIN  DW (KG): 78 HEPARIN  DW (KG): 78 kg   Vital Signs: Temp: 98.8 F (37.1 C) (06/23 1640) Temp Source: Oral (06/23 1640) BP: 110/53 (06/23 1640) Pulse Rate: 91 (06/23 0801)  Labs: Recent Labs    10/20/23 2200 10/21/23 0730 10/21/23 1230 10/21/23 1714  HGB 7.4* 10.0*  --   --   HCT 22.6* 30.1*  --   --   PLT 209 238  --   --   HEPARINUNFRC  --  0.26*  --  0.27*  CREATININE 1.34*  --  1.25*  --   TROPONINIHS 532* 501*  --   --     Estimated Creatinine Clearance: 58 mL/min (A) (by C-G formula based on SCr of 1.25 mg/dL (H)).   Medical History: Past Medical History:  Diagnosis Date   Anemia    Aortic stenosis    Arthritis    CHF (congestive heart failure) (HCC)    Coronary artery disease    Diabetes mellitus without complication (HCC)    type 2   GERD (gastroesophageal reflux disease)    Heart murmur    History of blood transfusion    Hypercholesteremia    Hypertension    Hypothyroidism    Mitral regurgitation    Myocardial infarction (HCC)    Peripheral vascular disease (HCC)    PONV (postoperative nausea and vomiting)    Thyroid disease     Medications:  Scheduled:   atorvastatin   40 mg Oral Daily   clopidogrel   75 mg Oral Daily   ferrous sulfate   325 mg Oral Q breakfast   insulin  aspart  0-5 Units Subcutaneous QHS   insulin  aspart  0-6 Units Subcutaneous TID WC   insulin  glargine-yfgn  10 Units Subcutaneous QHS   levothyroxine   150 mcg Oral QAC breakfast   pantoprazole   40 mg Oral BID   potassium chloride   40 mEq Oral Once   sodium  chloride flush  10-40 mL Intracatheter Q12H   Infusions:   ceFEPime  (MAXIPIME ) IV 2 g (10/21/23 1300)   heparin  1,300 Units/hr (10/21/23 1251)   lactated ringers  125 mL/hr at 10/21/23 0500   magnesium  sulfate bolus IVPB     vancomycin  1,000 mg (10/21/23 1353)    Assessment: 58 yo F with hx PAD s/p vascular bypass surgeries and bilateral transmetatarsal amputations, presented to [facility] with LLE pain on 6/20.  Request transfer to [facility] for VVS evaluation.  Pharmacy consulted to start IV heparin .  Of note, pt was on SQ heparin  for VTE ppx - last dose 6/20 @ 1600.  (Pt refused subsequent doses). Physician has requested no bolus and lower goal given patient's ongoing anemia.    Heparin  level subtherapeutic at 0.27, drawn ~4hr after rate increased to 1300 units/hr No bleeding or infusion issues.   Goal of Therapy:  Heparin  level 0.3-0.5 units/ml Monitor platelets by anticoagulation protocol: Yes   Plan:  Increase heparin  to 1400 units/h Repeat heparin  level in 8h  Rocky Slade, PharmD, BCPS Clinical Pharmacist [phone] Please check AMION for all [facility] Pharmacy numbers 10/21/2023

## 2023-10-21 NOTE — Progress Notes (Signed)
 PHARMACY - ANTICOAGULATION CONSULT NOTE  Pharmacy Consult for Heparin  Indication: LLE ischemia  Allergies  Allergen Reactions   Trental  [Pentoxifylline ] Nausea And Vomiting   Vibramycin [Doxycycline] Nausea And Vomiting   Penicillins Rash    Tolerated cephalosporins 07/2022     Patient Measurements: Height: 5' 4 (162.6 cm) Weight: 100.6 kg (221 lb 12.5 oz) IBW/kg (Calculated) : 54.7 HEPARIN  DW (KG): 78 HEPARIN  DW (KG): 78 HEPARIN  DW (KG): 78 kg   Vital Signs: Temp: 100 F (37.8 C) (06/23 0801) Temp Source: Oral (06/23 0801) BP: 125/63 (06/23 0801) Pulse Rate: 91 (06/23 0801)  Labs: Recent Labs    10/20/23 2200 10/21/23 0730  HGB 7.4* 10.0*  HCT 22.6* 30.1*  PLT 209 238  HEPARINUNFRC  --  0.26*  CREATININE 1.34*  --   TROPONINIHS 532* 501*    Estimated Creatinine Clearance: 54.1 mL/min (A) (by C-G formula based on SCr of 1.34 mg/dL (H)).   Medical History: Past Medical History:  Diagnosis Date   Anemia    Aortic stenosis    Arthritis    CHF (congestive heart failure) (HCC)    Coronary artery disease    Diabetes mellitus without complication (HCC)    type 2   GERD (gastroesophageal reflux disease)    Heart murmur    History of blood transfusion    Hypercholesteremia    Hypertension    Hypothyroidism    Mitral regurgitation    Myocardial infarction (HCC)    Peripheral vascular disease (HCC)    PONV (postoperative nausea and vomiting)    Thyroid disease     Medications:  Scheduled:   atorvastatin   40 mg Oral Daily   clopidogrel   75 mg Oral Daily   ferrous sulfate   325 mg Oral Q breakfast   insulin  aspart  0-5 Units Subcutaneous QHS   insulin  aspart  0-6 Units Subcutaneous TID WC   insulin  glargine-yfgn  10 Units Subcutaneous QHS   levothyroxine   150 mcg Oral QAC breakfast   pantoprazole   40 mg Oral BID   potassium chloride   40 mEq Oral Once   sodium chloride  flush  10-40 mL Intracatheter Q12H   Infusions:   ceFEPime  (MAXIPIME ) IV      heparin  1,100 Units/hr (10/21/23 0430)   lactated ringers  125 mL/hr at 10/21/23 0500   magnesium  sulfate bolus IVPB     vancomycin       Assessment: 58 yo F with hx PAD s/p vascular bypass surgeries and bilateral transmetatarsal amputations, presented to [facility] with LLE pain on 6/20.  Request transfer to [facility] for VVS evaluation.  Pharmacy consulted to start IV heparin .  Of note, pt was on SQ heparin  for VTE ppx - last dose 6/20 @ 1600.  (Pt refused subsequent doses). Physician has requested no bolus and lower goal given patient's ongoing anemia.    Heparin  level subtherapeutic at 0.26, no bleeding or infusion issues.   Goal of Therapy:  Heparin  level 0.3-0.5 units/ml Monitor platelets by anticoagulation protocol: Yes   Plan:  Increase heparin  to 1300 units/h Repeat heparin  level in 8h  Ozell Jamaica, PharmD, [place], [facility] Clinical Pharmacist [phone] Please check AMION for all [facility] Pharmacy numbers 10/21/2023

## 2023-10-21 NOTE — H&P (View-Only) (Signed)
 Patient ID: Kayla Schmitt, female   DOB: October 28, 1965, 58 y.o.   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  I have discussed this with vascular surgery and anticipate they can proceed with an open transtibial amputation either this afternoon or tomorrow morning and I will follow-up on Wednesday for repeat debridement and possible wound closure.

## 2023-10-21 NOTE — TOC CM/SW Note (Signed)
 Transition of Care [facility]) - Inpatient Brief Assessment   Patient Details  Name: Kayla Schmitt MRN: [id number] Date of Birth: 1966-02-12  [facility] CM/SW Contact:    Lauraine FORBES Saa, LCSW Phone Number: 10/21/2023, 9:35 AM   Clinical Narrative:  9:35 AM Per chart review, patient resides at home alone. Patient has a PCP and insurance. Patient does not have SNF history. Patient has HH history with [facility]. Patient has DME (wheelchair, drop arm BSC, sliding board for wheelchair, BSC) history with [employer]. No TOC needs were identified at this time. TOC will continue to follow and be available to assist.  Transition of Care Asessment: Insurance and Status: Insurance coverage has been reviewed Patient has primary care physician: Yes Home environment has been reviewed: Private Residence Prior level of function:: N/A Prior/Current Home Services: No current home services (Has HH/DME History) Social Drivers of Health Review: SDOH reviewed no interventions necessary Readmission risk has been reviewed: Yes Transition of care needs: no transition of care needs at this time

## 2023-10-21 NOTE — Consult Note (Addendum)
 ORTHOPAEDIC CONSULTATION  REQUESTING PHYSICIAN: Willette Adriana LABOR, MD  Chief Complaint: Acute ischemic changes left foot.  HPI: Kayla Schmitt is a 58 y.o. female who presents with acute ischemic changes left foot with acute pain and blistering.  Patient states the ischemic changes started on Friday.  Patient states she initially presented to [facility] and was transferred to [facility].  By report her ABIs dropped from 1-0.63.  ABIs are pending.  Past Medical History:  Diagnosis Date   Anemia    Aortic stenosis    Arthritis    CHF (congestive heart failure) (HCC)    Coronary artery disease    Diabetes mellitus without complication (HCC)    type 2   GERD (gastroesophageal reflux disease)    Heart murmur    History of blood transfusion    Hypercholesteremia    Hypertension    Hypothyroidism    Mitral regurgitation    Myocardial infarction (HCC)    Peripheral vascular disease (HCC)    PONV (postoperative nausea and vomiting)    Thyroid disease    Past Surgical History:  Procedure Laterality Date   ABDOMINAL AORTOGRAM W/LOWER EXTREMITY Bilateral 01/14/2020   Procedure: ABDOMINAL AORTOGRAM W/LOWER EXTREMITY;  Surgeon: Gretta Lonni PARAS, MD;  Location: [facility];  Service: Cardiovascular;  Laterality: Bilateral;   ABDOMINAL AORTOGRAM W/LOWER EXTREMITY N/A 07/05/2022   Procedure: ABDOMINAL AORTOGRAM W/LOWER EXTREMITY;  Surgeon: Gretta Lonni PARAS, MD;  Location: [facility];  Service: Cardiovascular;  Laterality: N/A;   ABDOMINAL AORTOGRAM W/LOWER EXTREMITY N/A 08/02/2022   Procedure: ABDOMINAL AORTOGRAM W/LOWER EXTREMITY;  Surgeon: Gretta Lonni PARAS, MD;  Location: [facility];  Service: Cardiovascular;  Laterality: N/A;   ABDOMINAL AORTOGRAM W/LOWER EXTREMITY N/A 08/06/2022   Procedure: ABDOMINAL AORTOGRAM W/LOWER EXTREMITY;  Surgeon: Gretta Lonni PARAS, MD;  Location: [facility];  Service: Cardiovascular;  Laterality: N/A;   AMPUTATION  Bilateral 07/20/2022   Procedure: AMPUTATION RIGHT GREAT TOE AND SECOND TOE, AMPUTATION LEFT GREAT TOE;  Surgeon: Harden Jerona GAILS, MD;  Location: [facility] OR;  Service: Orthopedics;  Laterality: Bilateral;   AMPUTATION Bilateral 08/08/2022   Procedure: BILATERAL TRANSMETATARSAL AMPUTATION;  Surgeon: Harden Jerona GAILS, MD;  Location: [facility] OR;  Service: Orthopedics;  Laterality: Bilateral;   AMPUTATION TOE     BIOPSY  04/06/2023   Procedure: BIOPSY;  Surgeon: Kaylanie Elspeth SQUIBB, MD;  Location: [facility] ENDOSCOPY;  Service: Gastroenterology;;   COLONOSCOPY WITH PROPOFOL  N/A 04/06/2023   Procedure: COLONOSCOPY WITH PROPOFOL ;  Surgeon: Karlynn Elspeth SQUIBB, MD;  Location: [facility] ENDOSCOPY;  Service: Gastroenterology;  Laterality: N/A;   ESOPHAGOGASTRODUODENOSCOPY (EGD) WITH PROPOFOL  N/A 04/06/2023   Procedure: ESOPHAGOGASTRODUODENOSCOPY (EGD) WITH PROPOFOL ;  Surgeon: Margarett Elspeth SQUIBB, MD;  Location: [facility] ENDOSCOPY;  Service: Gastroenterology;  Laterality: N/A;   GIVENS CAPSULE STUDY N/A 04/06/2023   Procedure: GIVENS CAPSULE STUDY;  Surgeon: Adonna Elspeth SQUIBB, MD;  Location: [facility] ENDOSCOPY;  Service: Gastroenterology;  Laterality: N/A;   PERIPHERAL VASCULAR ATHERECTOMY Right 01/14/2020   Procedure: PERIPHERAL VASCULAR ATHERECTOMY;  Surgeon: Gretta Lonni PARAS, MD;  Location: [facility] INVASIVE CV LAB;  Service: Cardiovascular;  Laterality: Right;  sfa   PERIPHERAL VASCULAR INTERVENTION Right 01/14/2020   Procedure: PERIPHERAL VASCULAR INTERVENTION;  Surgeon: Gretta Lonni PARAS, MD;  [facility]: [facility];  Service: Cardiovascular;  Laterality: Right;  sfa stent    PERIPHERAL VASCULAR INTERVENTION  07/05/2022   Procedure: PERIPHERAL VASCULAR INTERVENTION;  Surgeon: Gretta Lonni PARAS, MD;  [facility]: [facility];  Service: Cardiovascular;;   PERIPHERAL VASCULAR INTERVENTION  08/02/2022   Procedure: PERIPHERAL VASCULAR INTERVENTION;  Surgeon: Gretta Lonni PARAS, MD;  [facility];  Service:  Cardiovascular;;   PERIPHERAL VASCULAR INTERVENTION  08/06/2022   Procedure: PERIPHERAL VASCULAR INTERVENTION;  Surgeon: Gretta Lonni PARAS, MD;  Location: [facility] INVASIVE CV LAB;  Service: Cardiovascular;;   PERIPHERAL VASCULAR THROMBECTOMY  08/02/2022   Procedure: PERIPHERAL VASCULAR THROMBECTOMY;  Surgeon: Gretta Lonni PARAS, MD;  [facility]: [facility];  Service: Cardiovascular;;   RIGHT/LEFT HEART CATH AND CORONARY ANGIOGRAPHY N/A 08/23/2022   Procedure: RIGHT/LEFT HEART CATH AND CORONARY ANGIOGRAPHY;  Surgeon: Dann Candyce RAMAN, MD;  Location: [facility] INVASIVE CV LAB;  Service: Cardiovascular;  Laterality: N/A;   SKIN SPLIT GRAFT Right 03/18/2020   Procedure: SKIN GRAFTING RIGHT FOOT ULCER;  Surgeon: Harden Jerona GAILS, MD;  Location: [facility] OR;  Service: Orthopedics;  Laterality: Right;   STUMP REVISION Right 11/30/2022   Procedure: REVISION RIGHT TRANSMETATARSAL AMPUTATION;  Surgeon: Harden Jerona GAILS, MD;  Location: [facility] OR;  Service: Orthopedics;  Laterality: Right;   TEE WITHOUT CARDIOVERSION N/A 08/27/2022   Procedure: TRANSESOPHAGEAL ECHOCARDIOGRAM;  Surgeon: Hobart Powell BRAVO, MD;  Location: [facility] INVASIVE CV LAB;  Service: Cardiovascular;  Laterality: N/A;   WISDOM TOOTH EXTRACTION     Social History   Socioeconomic History   Marital status: Divorced    Spouse name: Not on file   Number of children: 0   Years of education: Not on file   Highest education level: Not on file  Occupational History   Not on file  Tobacco Use   Smoking status: Former    Current packs/day: 0.00    Types: Cigarettes    Quit date: 01/2017    Years since quitting: 6.7   Smokeless tobacco: Never  Vaping Use   Vaping status: Never Used  Substance and Sexual Activity   Alcohol use: Not Currently   Drug use: Yes    Types: Marijuana    Comment: on ocassion- Last time - 11/13/22   Sexual activity: Yes    Birth control/protection: Post-menopausal  Other Topics Concern   Not on file  Social History Narrative    Not on file   Social Drivers of Health   Financial Resource Strain: Low Risk  (10/19/2023)   Received from [facility]   Overall Financial Resource Strain (CARDIA)    How hard is it for you to pay for the very basics like food, housing, medical care, and heating?: Not hard at all  Food Insecurity: No Food Insecurity (10/20/2023)   Hunger Vital Sign    Worried About Running Out of Food in the Last Year: Never true    Ran Out of Food in the Last Year: Never true  Transportation Needs: No Transportation Needs (10/20/2023)   PRAPARE - Transportation    Lack of Transportation (Medical): No    Lack of Transportation (Non-Medical): No  Physical Activity: Not on file  Stress: Not on file  Social Connections: Not on file   Family History  Problem Relation Age of Onset   Diabetes Other    CAD Other    - negative except otherwise stated in the family history section Allergies  Allergen Reactions   Trental  [Pentoxifylline ] Nausea And Vomiting   Vibramycin [Doxycycline] Nausea And Vomiting   Penicillins Rash    Tolerated cephalosporins 07/2022    Prior to Admission medications   Medication Sig Start Date End Date Taking? Authorizing Provider  acetaminophen  (TYLENOL )  500 MG tablet Take 1,000 mg by mouth 2 (two) times daily as needed for moderate pain (pain score 4-6), headache or fever.   Yes [provider]  aspirin  81 MG EC tablet Take 81 mg by mouth daily.   Yes [provider]  atorvastatin  (LIPITOR) 40 MG tablet Take 40 mg by mouth daily. 12/14/19  Yes [provider]  clopidogrel  (PLAVIX ) 75 MG tablet TAKE 1 TABLET BY MOUTH EVERY DAY WITH BREAKFAST 01/28/23  Yes Sheree Penne Bruckner, MD  diphenhydrAMINE HCl, Sleep, (ZZZQUIL) 25 MG CAPS Take 25 mg by mouth at bedtime as needed (Sleep).   Yes [provider]  ferrous sulfate  325 (65 FE) MG EC tablet Take 1 tablet (325 mg total) by mouth daily with breakfast. 04/07/23 04/06/24 Yes Fairy Frames,  MD  furosemide  (LASIX ) 40 MG tablet Take 1 tablet (40 mg total) by mouth 2 (two) times daily. 08/27/22  Yes Leotis Bogus, MD  gabapentin  (NEURONTIN ) 300 MG capsule Take 300 mg by mouth at bedtime.   Yes [provider]  insulin  degludec (TRESIBA  FLEXTOUCH) 100 UNIT/ML FlexTouch Pen Inject 10 Units into the skin at bedtime. Patient taking differently: Inject 16 Units into the skin at bedtime. 01/15/20  Yes Danford, Bruckner SQUIBB, MD  levothyroxine  (SYNTHROID ) 150 MCG tablet Take 150 mcg by mouth daily before breakfast. 12/02/19  Yes [provider]  metoprolol  succinate (TOPROL -XL) 25 MG 24 hr tablet Take 0.5 tablets (12.5 mg total) by mouth daily. 08/28/22  Yes Leotis Bogus, MD  oxyCODONE  (ROXICODONE ) 5 MG immediate release tablet Take 1 tablet (5 mg total) by mouth every 6 (six) hours as needed for severe pain (pain score 7-10). 10/15/23  Yes Francesca Elsie CROME, MD  pantoprazole  (PROTONIX ) 40 MG tablet Take 40 mg by mouth daily. 12/21/19  Yes [provider]  promethazine  (PHENERGAN ) 25 MG tablet Take 1 tablet (25 mg total) by mouth every 6 (six) hours as needed for nausea or vomiting. 10/15/23  Yes Francesca Elsie CROME, MD  Alcohol Swabs (PHARMACIST CHOICE ALCOHOL) PADS Apply topically. 04/09/23   [provider]  nitroGLYCERIN  (NITROSTAT ) 0.4 MG SL tablet Place 1 tablet (0.4 mg total) under the tongue every 5 (five) minutes x 3 doses as needed for chest pain (if no relief after 3rd dose proceed to ED or call 911). 11/06/2022-New 11/06/22   Mallipeddi, Vishnu P, MD   No results found. - pertinent xrays, CT, MRI studies were reviewed and independently interpreted  Positive ROS: All other systems have been reviewed and were otherwise negative with the exception of those mentioned in the HPI and as above.  Physical Exam: General: Alert, no acute distress Psychiatric: Patient is competent for consent with normal mood and affect Lymphatic: No axillary or cervical  lymphadenopathy Cardiovascular: No pedal edema Respiratory: No cyanosis, no use of accessory musculature GI: No organomegaly, abdomen is soft and non-tender    Images:  @ENCIMAGES @  Labs:  Lab Results  Component Value Date   HGBA1C 6.4 (H) 08/02/2022   HGBA1C 11.0 (H) 01/11/2020   ESRSEDRATE 136 (H) 01/14/2020   ESRSEDRATE 126 (H) 01/13/2020   ESRSEDRATE 125 (H) 01/12/2020   CRP 11.3 (H) 01/14/2020   CRP 13.0 (H) 01/13/2020   CRP 15.8 (H) 01/12/2020   LABURIC 7.7 (H) 02/04/2022   REPTSTATUS 08/07/2022 FINAL 08/02/2022   CULT  08/02/2022    NO GROWTH 5 DAYS Performed at [facility], [address], [place], [place] [address]     Lab Results  Component Value Date   ALBUMIN <1.5 (L) 10/20/2023   ALBUMIN 3.2 (L) 10/15/2023   ALBUMIN 2.1 (L) 04/04/2023   LABURIC 7.7 (H) 02/04/2022        Latest Ref Rng & Units 10/20/2023   10:00 PM 10/15/2023    4:49 PM 09/24/2023    3:15 PM  CBC EXTENDED  WBC 4.0 - 10.5 K/uL 10.4  8.0  6.8   RBC 3.87 - 5.11 MIL/uL 2.58  3.47  3.40   Hemoglobin 12.0 - 15.0 g/dL 7.4  89.7  89.7   HCT 63.9 - 46.0 % 22.6  31.1  29.6   Platelets 150 - 400 K/uL 209  266  324.0   NEUT# 1.7 - 7.7 K/uL  6.9  3.5   Lymph# 0.7 - 4.0 K/uL  0.4  2.5     Neurologic: Patient does not have protective sensation bilateral lower extremities.   MUSCULOSKELETAL:   Skin: Examination of the left foot it is not cold but there are ischemic changes up past the ankle as well as large blisters around the hindfoot and forefoot.  Examination of the right foot she has a stable show part amputation without ischemic changes.  There is some slight redness on the heel.  Patient has a Mepilex dressing applied.  Left calf has no crepitation no blistering proximal to the ankle.  Labs show a sodium decreased to 132.  Glucose 167.  BUN 26 with a creatinine of 1.34. Albumin less than 1.5.  No recent uric acid.  White cell count 10.4.  With a hemoglobin of 7.4.  Patient's  LRINEC score is 7.  Assessment:  Acute ischemic changes left foot status post show part amputation bilaterally with a LRINEC score of 7 placing her at intermediate risk for necrotizing fasciitis.  Plan: Plan: With patient has acute ischemic changes I have consulted vascular vein surgery.  Surgical intervention based on vascular status.  Patient is intermediate risk for necrotizing fasciitis.  Patient will require transfusion prior to surgical intervention.  Patient is on Maxipime  for antibiotics coverage and an MRI scan of the leg is pending.  If there are any fascial changes on the MRI scan I have discussed with Dr. Sheree proceeding with urgent transtibial amputation.  Thank you for the consult and the opportunity to see Ms. Monika Jerona Sage, MD [facility] [phone] 7:05 AM

## 2023-10-21 NOTE — Consult Note (Signed)
 Hospital Consult    Reason for Consult: Concern for ischemia left lower extremity Referring Physician: Dr. Willette MRN #:  [id number]  History of Present Illness: This is a 58 y.o. female history of bilateral transmetatarsal amputations and previous bilateral lower extremity endovascular revascularization.  Most recent was evaluated by Dr. Gretta in the office just 1 month ago with discussion of possible right lower extremity endovascular evaluation.  At that time she was not having any issues.  She is now admitted for pain and blistering of the left foot.  She has been evaluated by Dr. Harden.  Past Medical History:  Diagnosis Date   Anemia    Aortic stenosis    Arthritis    CHF (congestive heart failure) (HCC)    Coronary artery disease    Diabetes mellitus without complication (HCC)    type 2   GERD (gastroesophageal reflux disease)    Heart murmur    History of blood transfusion    Hypercholesteremia    Hypertension    Hypothyroidism    Mitral regurgitation    Myocardial infarction (HCC)    Peripheral vascular disease (HCC)    PONV (postoperative nausea and vomiting)    Thyroid disease     Past Surgical History:  Procedure Laterality Date   ABDOMINAL AORTOGRAM W/LOWER EXTREMITY Bilateral 01/14/2020   Procedure: ABDOMINAL AORTOGRAM W/LOWER EXTREMITY;  Surgeon: Gretta Lonni PARAS, MD;  Location: [facility];  Service: Cardiovascular;  Laterality: Bilateral;   ABDOMINAL AORTOGRAM W/LOWER EXTREMITY N/A 07/05/2022   Procedure: ABDOMINAL AORTOGRAM W/LOWER EXTREMITY;  Surgeon: Gretta Lonni PARAS, MD;  Location: [facility];  Service: Cardiovascular;  Laterality: N/A;   ABDOMINAL AORTOGRAM W/LOWER EXTREMITY N/A 08/02/2022   Procedure: ABDOMINAL AORTOGRAM W/LOWER EXTREMITY;  Surgeon: Gretta Lonni PARAS, MD;  Location: [facility];  Service: Cardiovascular;  Laterality: N/A;   ABDOMINAL AORTOGRAM W/LOWER EXTREMITY N/A 08/06/2022   Procedure: ABDOMINAL AORTOGRAM  W/LOWER EXTREMITY;  Surgeon: Gretta Lonni PARAS, MD;  Location: [facility];  Service: Cardiovascular;  Laterality: N/A;   AMPUTATION Bilateral 07/20/2022   Procedure: AMPUTATION RIGHT GREAT TOE AND SECOND TOE, AMPUTATION LEFT GREAT TOE;  Surgeon: Harden Jerona GAILS, MD;  Location: [facility] OR;  Service: Orthopedics;  Laterality: Bilateral;   AMPUTATION Bilateral 08/08/2022   Procedure: BILATERAL TRANSMETATARSAL AMPUTATION;  Surgeon: Harden Jerona GAILS, MD;  Location: [facility] OR;  Service: Orthopedics;  Laterality: Bilateral;   AMPUTATION TOE     BIOPSY  04/06/2023   Procedure: BIOPSY;  Surgeon: Pamelia Elspeth SQUIBB, MD;  Location: [facility] ENDOSCOPY;  Service: Gastroenterology;;   COLONOSCOPY WITH PROPOFOL  N/A 04/06/2023   Procedure: COLONOSCOPY WITH PROPOFOL ;  Surgeon: Rachella Elspeth SQUIBB, MD;  Location: [facility] ENDOSCOPY;  Service: Gastroenterology;  Laterality: N/A;   ESOPHAGOGASTRODUODENOSCOPY (EGD) WITH PROPOFOL  N/A 04/06/2023   Procedure: ESOPHAGOGASTRODUODENOSCOPY (EGD) WITH PROPOFOL ;  Surgeon: Mayda Elspeth SQUIBB, MD;  Location: [facility] ENDOSCOPY;  Service: Gastroenterology;  Laterality: N/A;   GIVENS CAPSULE STUDY N/A 04/06/2023   Procedure: GIVENS CAPSULE STUDY;  Surgeon: Aunica Elspeth SQUIBB, MD;  Location: [facility] ENDOSCOPY;  Service: Gastroenterology;  Laterality: N/A;   PERIPHERAL VASCULAR ATHERECTOMY Right 01/14/2020   Procedure: PERIPHERAL VASCULAR ATHERECTOMY;  Surgeon: Gretta Lonni PARAS, MD;  Location: [facility] INVASIVE CV LAB;  Service: Cardiovascular;  Laterality: Right;  sfa   PERIPHERAL VASCULAR INTERVENTION Right 01/14/2020   Procedure: PERIPHERAL VASCULAR INTERVENTION;  Surgeon: Gretta Lonni PARAS, MD;  Location: [facility];  Service: Cardiovascular;  Laterality: Right;  sfa stent  PERIPHERAL VASCULAR INTERVENTION  07/05/2022   Procedure: PERIPHERAL VASCULAR INTERVENTION;  Surgeon: Gretta Lonni PARAS, MD;  Location: [facility] INVASIVE CV LAB;  Service: Cardiovascular;;   PERIPHERAL VASCULAR INTERVENTION   08/02/2022   Procedure: PERIPHERAL VASCULAR INTERVENTION;  Surgeon: Gretta Lonni PARAS, MD;  Location: [facility];  Service: Cardiovascular;;   PERIPHERAL VASCULAR INTERVENTION  08/06/2022   Procedure: PERIPHERAL VASCULAR INTERVENTION;  Surgeon: Gretta Lonni PARAS, MD;  Location: [facility] INVASIVE CV LAB;  Service: Cardiovascular;;   PERIPHERAL VASCULAR THROMBECTOMY  08/02/2022   Procedure: PERIPHERAL VASCULAR THROMBECTOMY;  Surgeon: Gretta Lonni PARAS, MD;  Location: [facility];  Service: Cardiovascular;;   RIGHT/LEFT HEART CATH AND CORONARY ANGIOGRAPHY N/A 08/23/2022   Procedure: RIGHT/LEFT HEART CATH AND CORONARY ANGIOGRAPHY;  Surgeon: Dann Candyce RAMAN, MD;  Location: [facility] INVASIVE CV LAB;  Service: Cardiovascular;  Laterality: N/A;   SKIN SPLIT GRAFT Right 03/18/2020   Procedure: SKIN GRAFTING RIGHT FOOT ULCER;  Surgeon: Harden Jerona GAILS, MD;  Location: [facility] OR;  Service: Orthopedics;  Laterality: Right;   STUMP REVISION Right 11/30/2022   Procedure: REVISION RIGHT TRANSMETATARSAL AMPUTATION;  Surgeon: Harden Jerona GAILS, MD;  Location: [facility] OR;  Service: Orthopedics;  Laterality: Right;   TEE WITHOUT CARDIOVERSION N/A 08/27/2022   Procedure: TRANSESOPHAGEAL ECHOCARDIOGRAM;  Surgeon: Hobart Powell BRAVO, MD;  Location: [facility] INVASIVE CV LAB;  Service: Cardiovascular;  Laterality: N/A;   WISDOM TOOTH EXTRACTION      Allergies  Allergen Reactions   Trental  [Pentoxifylline ] Nausea And Vomiting   Vibramycin [Doxycycline] Nausea And Vomiting   Penicillins Rash    Tolerated cephalosporins 07/2022     Prior to Admission medications   Medication Sig Start Date End Date Taking? Authorizing Provider  acetaminophen  (TYLENOL ) 500 MG tablet Take 1,000 mg by mouth 2 (two) times daily as needed for moderate pain (pain score 4-6), headache or fever.   Yes [provider]  aspirin  81 MG EC tablet Take 81 mg by mouth daily.   Yes [provider]  atorvastatin  (LIPITOR) 40 MG tablet Take  40 mg by mouth daily. 12/14/19  Yes [provider]  clopidogrel  (PLAVIX ) 75 MG tablet TAKE 1 TABLET BY MOUTH EVERY DAY WITH BREAKFAST 01/28/23  Yes Sheree Penne Lonni, MD  diphenhydrAMINE HCl, Sleep, (ZZZQUIL) 25 MG CAPS Take 25 mg by mouth at bedtime as needed (Sleep).   Yes [provider]  ferrous sulfate  325 (65 FE) MG EC tablet Take 1 tablet (325 mg total) by mouth daily with breakfast. 04/07/23 04/06/24 Yes Fairy Frames, MD  furosemide  (LASIX ) 40 MG tablet Take 1 tablet (40 mg total) by mouth 2 (two) times daily. 08/27/22  Yes Leotis Bogus, MD  gabapentin  (NEURONTIN ) 300 MG capsule Take 300 mg by mouth at bedtime.   Yes [provider]  insulin  degludec (TRESIBA  FLEXTOUCH) 100 UNIT/ML FlexTouch Pen Inject 10 Units into the skin at bedtime. Patient taking differently: Inject 16 Units into the skin at bedtime. 01/15/20  Yes Danford, Lonni SQUIBB, MD  levothyroxine  (SYNTHROID ) 150 MCG tablet Take 150 mcg by mouth daily before breakfast. 12/02/19  Yes [provider]  metoprolol  succinate (TOPROL -XL) 25 MG 24 hr tablet Take 0.5 tablets (12.5 mg total) by mouth daily. 08/28/22  Yes Leotis Bogus, MD  oxyCODONE  (ROXICODONE ) 5 MG immediate release tablet Take 1 tablet (5 mg total) by mouth every 6 (six) hours as needed for severe pain (pain score 7-10). 10/15/23  Yes Francesca Elsie CROME, MD  pantoprazole  (PROTONIX ) 40 MG tablet Take  40 mg by mouth daily. 12/21/19  Yes [provider]  promethazine  (PHENERGAN ) 25 MG tablet Take 1 tablet (25 mg total) by mouth every 6 (six) hours as needed for nausea or vomiting. 10/15/23  Yes Francesca Elsie CROME, MD  Alcohol Swabs (PHARMACIST CHOICE ALCOHOL) PADS Apply topically. 04/09/23   [provider]  nitroGLYCERIN  (NITROSTAT ) 0.4 MG SL tablet Place 1 tablet (0.4 mg total) under the tongue every 5 (five) minutes x 3 doses as needed for chest pain (if no relief after 3rd dose proceed to ED or call 911).  11/06/2022-New 11/06/22   Mallipeddi, Diannah SQUIBB, MD    Social History   Socioeconomic History   Marital status: Divorced    Spouse name: Not on file   Number of children: 0   Years of education: Not on file   Highest education level: Not on file  Occupational History   Not on file  Tobacco Use   Smoking status: Former    Current packs/day: 0.00    Types: Cigarettes    Quit date: 01/2017    Years since quitting: 6.7   Smokeless tobacco: Never  Vaping Use   Vaping status: Never Used  Substance and Sexual Activity   Alcohol use: Not Currently   Drug use: Yes    Types: Marijuana    Comment: on ocassion- Last time - 11/13/22   Sexual activity: Yes    Birth control/protection: Post-menopausal  Other Topics Concern   Not on file  Social History Narrative   Not on file   Social Drivers of Health   Financial Resource Strain: Low Risk  (10/19/2023)   Received from [facility]   Overall Financial Resource Strain (CARDIA)    How hard is it for you to pay for the very basics like food, housing, medical care, and heating?: Not hard at all  Food Insecurity: No Food Insecurity (10/20/2023)   Hunger Vital Sign    Worried About Running Out of Food in the Last Year: Never true    Ran Out of Food in the Last Year: Never true  Transportation Needs: No Transportation Needs (10/20/2023)   PRAPARE - Transportation    Lack of Transportation (Medical): No    Lack of Transportation (Non-Medical): No  Physical Activity: Not on file  Stress: Not on file  Social Connections: Not on file  Intimate Partner Violence: Not At Risk (10/20/2023)   Humiliation, Afraid, Rape, and Kick questionnaire    Fear of Current or Ex-Partner: No    Emotionally Abused: No    Physically Abused: No    Sexually Abused: No     Family History  Problem Relation Age of Onset   Diabetes Other    CAD Other    Review of Systems  Constitutional: Negative.   HENT: Negative.    Eyes: Negative.   Respiratory:  Negative.    Cardiovascular: Negative.   Gastrointestinal: Negative.   Musculoskeletal:        Left leg pain  Skin: Negative.   Neurological: Negative.   Endo/Heme/Allergies: Negative.   Psychiatric/Behavioral: Negative.        Physical Examination  Vitals:   10/21/23 0458 10/21/23 0538  BP: 132/72 128/61  Pulse: 87 84  Resp:  19  Temp: 98.4 F (36.9 C) 99.8 F (37.7 C)  SpO2: 96% 98%   Body mass index is 38.07 kg/m.  Physical Exam HENT:     Mouth/Throat:     Mouth: Mucous membranes are moist.   Eyes:  Pupils: Pupils are equal, round, and reactive to light.    Cardiovascular:     Pulses:          Popliteal pulses are 0 on the left side.       Dorsalis pedis pulses are 0 on the left side.       Posterior tibial pulses are 0 on the left side.  Pulmonary:     Effort: Pulmonary effort is normal.  Abdominal:     General: Abdomen is flat.   Musculoskeletal:     Right lower leg: No edema.     Left lower leg: Edema present.     Comments: Well-healed right TMA Pain tracking up posterior left calf   Neurological:     Mental Status: She is alert.         CBC    Component Value Date/Time   WBC 10.4 10/20/2023 2200   RBC 2.58 (L) 10/20/2023 2200   HGB 7.4 (L) 10/20/2023 2200   HCT 22.6 (L) 10/20/2023 2200   PLT 209 10/20/2023 2200   MCV 87.6 10/20/2023 2200   MCH 28.7 10/20/2023 2200   MCHC 32.7 10/20/2023 2200   RDW 14.6 10/20/2023 2200   LYMPHSABS 0.4 (L) 10/15/2023 1649   MONOABS 0.6 10/15/2023 1649   EOSABS 0.0 10/15/2023 1649   BASOSABS 0.0 10/15/2023 1649    BMET    Component Value Date/Time   NA 132 (L) 10/20/2023 2200   K 3.2 (L) 10/20/2023 2200   CL 103 10/20/2023 2200   CO2 19 (L) 10/20/2023 2200   GLUCOSE 167 (H) 10/20/2023 2200   BUN 26 (H) 10/20/2023 2200   CREATININE 1.34 (H) 10/20/2023 2200   CALCIUM  7.2 (L) 10/20/2023 2200   GFRNONAA 47 (L) 10/20/2023 2200   GFRAA >60 01/15/2020 0836    COAGS: Lab Results   Component Value Date   INR 1.2 08/18/2022     ASSESSMENT/PLAN: This is a 58 y.o. female with history of previous bilateral lower extremity revascularizations.  Most recently was being evaluated for right lower extremity repeat endovascular evaluation now with what is concerning for necrotizing fasciitis of the left lower extremity.  She has been started on antibiotics.  She does have pain tracking up the posterior calf.  I discussed with the patient that this does not appear to be viable and she will require amputation and Dr. Harden tentatively has her scheduled for Wednesday.  If this needs to be done prior to Wednesday we can arrange through our office.  I will notify Dr. Gretta for admission  [place] [place], MD [facility] Office: [phone] Pager: [phone]

## 2023-10-21 NOTE — Progress Notes (Signed)
 Patient ID: Kayla Schmitt, female   DOB: October 28, 1965, 58 y.o.   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  I have discussed this with vascular surgery and anticipate they can proceed with an open transtibial amputation either this afternoon or tomorrow morning and I will follow-up on Wednesday for repeat debridement and possible wound closure.

## 2023-10-21 NOTE — Progress Notes (Signed)
  Echocardiogram 2D Echocardiogram has been performed.  Tinnie FORBES Gosling RDCS 10/21/2023, 3:13 PM

## 2023-10-21 NOTE — Progress Notes (Signed)
 PROGRESS NOTE    Patient: Kayla Schmitt                            PCP: [name], NP                    DOB: July 01, 1965            DOA: 10/20/2023 FMW:[id number]             DOS: 10/21/2023, 11:53 AM   LOS: 1 day   Date of Service: The patient was seen and examined on 10/21/2023  Subjective:   The patient was seen and examined this morning. Hemodynamically stable. No issues overnight .  Brief Narrative:   Kayla Schmitt is a 58 y.o. 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 At presentation to emergency department patient found to be lethargic, febrile and hypotensive. Sepsis protocol initiated and placed on vancomycin  and cefepime .  Blood cultures growing out gram-positive cocci but no clear source of infection.  Patient reported to be alert and oriented x 3 and complaining of pain in left leg.  Patient underwent CT of the chest abdomen and pelvis without clear abnormality as well as MRI of stump.   Doppler ultrasound also negative for any signs for DVT.  Noted to have purple spotty area of discoloration in the left leg.  Previously patient had ABI in left leg that was normal in May of 1.01, but now reported to be 0.63.  Transfer requested for need of vascular services.   Blood pressure still reported to be soft and patient was in the ICU, but not on pressors and O2 saturations maintained on room air. ED physician consulted vascular surgeon at [facility] recommended to transfer for further evaluation by vascular surgery.   At [facility] around patient has been treated for sepsis with endorgan dysfunction including elevated lactic acid and AKI,  peripheral vascular disease with increased pain of the left lower extremity, AKI, altered mental status, DM type II and elevated troponin secondary to demand ischemia.  Patient also received 1 unit of blood transfusion as hemoglobin dropped 8.2-7.  No GI bleed at outside hospital.    Assessment & Plan:   Principal Problem:   Sepsis (HCC) Active Problems:   PVD (peripheral vascular disease) (HCC)   AKI (acute kidney injury) (HCC)   Diffuse pain in left lower extremity   Elevated troponin   Hypokalemia   Insulin  dependent type 2 diabetes mellitus (HCC)   Hypothyroidism   Acute on chronic anemia   History of GI bleed   History of coronary artery disease   Prolonged QT interval      Assessment and Plan: Sepsis-unknown source of infection at this time - Much improved -stable now  -POA: found hypotensive, tachycardic, febrile 102 F, complaining about left lower extremity pain, nausea and vomiting.  She also found to have altered mental status however with fluid resuscitation, and after treating with broad-spectrum antibiotic IV and cefepime  mentation has been improved significantly.  Patient was admitted to [facility] however never required any pressor support.  -CT chest abdomen pelvis did not reveal any foci of infection.  UA unremarkable - MRI  of the left foot no evidence of osteomyelitis, abscess or focal area of infection. -Outside facility patient has been treated with IV vancomycin  and cefepime . - Previous vasculature 6/21 showed gram-positive cocci however unable to find any source of infection. - Multiple small and large bulla formation around the ankle and purple discoloration of the left-sided foot stump. - Repeating MRI of the of the left lower extremity to rule out abscess/gas formation/development of osteomyelitis.  - Continue broad-spectrum antibiotic coverage. - Rechecking blood culture.--- - S/p 1 L of LR bolus, and starting maintenance fluid LR 125 cc/h.   Peripheral  vascular disease s/p bilateral lower extremity stent Diffuse left lower extremity pain   - H/o PVD, S/p bilateral transmetatarsal amputation and stenting. ((Most recently on 08/02/2022 she underwent left SFA and above-knee popliteal percutaneous thrombectomy with JETI and additional angioplasty and stenting into the above-knee popliteal artery for an occluded distal SFA stent with tissue loss. She then on 08/06/2022 underwent laser arthrectomy of the right SFA above-knee popliteal with DCB for in-stent stenosis. She had bilateral TMA's with Dr. Harden on 08/08/2022))  -MRI obtained did not show any evidence of osteomyelitis. -DVT ultrasound no evidence of blood clot. - Given patient has significant vasculopath with bilateral lower extremity stent, history of CAD, concern for development of worsening PAD. -ABI of the left lower extremity shows loss of tibial waveform on ABI.  - Left-sided lower extremity purple discoloration and there is a large bullae formation around the ankle.  Tender palpation and warm to touch. - Concern for early development of left lower extremity ischemia. - Starting IV heparin  drip with low-dose without any bolus.  (Patient does not have any active GI bleed however hemoglobin dropped to 7 at outside facility status post monitor blood transfusion afterward improved to  8.1.) -Continue Plavix  and Lipitor.  Holding aspirin   -Vascular surgeon Dr. Sheree has been accepted this patient to transfer to [facility].  Informed overnight Dr. Sheree stated that patient possibly angiogram of the lower extremity later this week.     Acute kidney injury -At initial presentation to ED creatinine was 2.32 which has been improved around 1.7.  Prerenal acute kidney injury in the setting of sepsis and hypotension.  Continue maintenance fluid.  Avoid nephrotoxic agent.  Continue with renal function.   Elevated troponin-secondary demand ischemia -Outside facility troponin was elevated 696>576 >>532 .  No EKG changes  -  Currently patient denies any chest pain. -EKG showed normal sinus rhythm heart rate 77, prolonged QT 486 msec, normal ST and T wave. Rechecking troponin level. - Elevated troponin secondary to demand ischemia in the context of sepsis..  -Will obtain Echo:  - Patient being already on heparin  drip for the mx of concern of ischemia of the left lower extremity. -Continue Plavix  and Lipitor.     Hypokalemia/hypomagnesemia -Checking BMP to follow-up with potassium level-will replace accordingly   Acute on chronic anemia History of GI bleed secondary to AVM -Baseline hemoglobin around 10.  - Hemoglobin dropped to 7 outside facility.  Patient did not had any GI bleed.   Received 1 unit of blood transfusion OSH so far. -  Monitoring for GI bleed given patient being starting on low-dose heparin  drip and on Plavix . 10/20/23 - Hemoglobin dropped to 7.4.  Transfusing 1 unit of blood tonight.  If patient develops any active GI bleeding need to hold the heparin  drip.     Insulin -dependent DM type II -Continue Lantus , SSI coverage.   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   History of CAD -Continue  Plavix , Lipitor.   Prolonged QT -EKG showed normal sinus rhythm heart rate 77, prolonged QT 491 msec, normal ST and T wave.  Need to avoid further QT prolonging medication.    ------------------------------------------------------------------------------------------- Nutritional status:  The patient's BMI is: Body mass index is 38.07 kg/m. I agree with the assessment and plan as outlined       Skin Assessment: Lower extremity metatarsal amputation potation's, left lower extremity discoloration with large volume No open wounds or drainage:     ------------------------------------------------------------------------------------------- Cultures; Blood  Cultures x 2 >> Gram-positive cocci   DVT prophylaxis:  SCDs Start: 10/20/23 2037 Heparin  drip-on hold  Code Status:   Code Status: Full Code  Family Communication: No family member present at bedside- -Advance care planning has been discussed.   Admission status:   Status is: Inpatient Remains inpatient appropriate because: Needing IV heparin , IV antibiotics, vascular evaluation   Disposition: From  - home             Planning for discharge in >2 days: to   Procedures:   No admission procedures for hospital encounter.   Antimicrobials:  Anti-infectives (From admission, onward)    Start     Dose/Rate Route Frequency Ordered Stop   10/21/23 2200  vancomycin  (VANCOCIN ) IVPB 1000 mg/200 mL premix  Status:  Discontinued       Placed in Followed by Linked Group   1,000 mg 200 mL/hr over 60 Minutes Intravenous Every 24 hours 10/20/23 2029 10/20/23 2130   10/21/23 1400  vancomycin  (VANCOCIN ) IVPB 1000 mg/200 mL premix        1,000 mg 200 mL/hr over 60 Minutes Intravenous Every 24 hours 10/20/23 2212     10/21/23 1000  ceFEPIme  (MAXIPIME ) 2 g in sodium chloride  0.9 % 100 mL IVPB        2 g 200 mL/hr over 30 Minutes Intravenous Every 12 hours 10/20/23 2212     10/20/23 2200  vancomycin  (VANCOREADY) IVPB 2000 mg/400 mL  Status:  Discontinued       Placed in Followed by Linked Group   2,000 mg 200 mL/hr over 120 Minutes Intravenous  Once 10/20/23 2029 10/20/23 2130   10/20/23 2100  ceFEPIme  (MAXIPIME ) 2 g in sodium chloride  0.9 % 100 mL IVPB  Status:  Discontinued        2 g 200 mL/hr over 30 Minutes Intravenous Every 12 hours 10/20/23 2029 10/20/23 2212        Medication:   atorvastatin   40 mg Oral Daily   clopidogrel   75 mg Oral Daily   ferrous sulfate   325 mg Oral Q breakfast   insulin  aspart  0-5 Units Subcutaneous QHS   insulin  aspart  0-6 Units Subcutaneous TID WC   insulin  glargine-yfgn  10 Units Subcutaneous QHS   levothyroxine   150 mcg Oral QAC breakfast    pantoprazole   40 mg Oral BID   potassium chloride   40 mEq Oral Once   sodium chloride  flush  10-40 mL Intracatheter Q12H    acetaminophen  **OR** acetaminophen , diphenhydrAMINE, senna-docusate, trimethobenzamide   Objective:   Vitals:   10/21/23 0230 10/21/23 0458 10/21/23 0538 10/21/23 0801  BP: (!) 111/56 132/72 128/61 125/63  Pulse: 81 87 84 91  Resp: 16  19 18   Temp: 99.4 F (37.4 C) 98.4 F (36.9 C) 99.8 F (37.7 C) 100  F (37.8 C)  TempSrc: Oral Oral Oral Oral  SpO2: 96% 96% 98% 94%  Weight:      Height:        Intake/Output Summary (Last 24 hours) at 10/21/2023 1153 Last data filed at 10/21/2023 0458 Gross per 24 hour  Intake 538.54 ml  Output --  Net 538.54 ml   Filed Weights   10/20/23 1956  Weight: 100.6 kg     Physical examination:   Constitution:  Alert, cooperative, no distress,  Appears calm and comfortable  Psychiatric:   Normal and stable mood and affect, cognition intact,   HEENT:        Normocephalic, PERRL, otherwise with in Normal limits  Chest:         Chest symmetric Cardio vascular:  S1/S2, RRR, No murmure, No Rubs or Gallops  pulmonary: Clear to auscultation bilaterally, respirations unlabored, negative wheezes / crackles Abdomen: Soft, non-tender, non-distended, bowel sounds,no masses, no organomegaly Muscular skeletal: Limited exam - in bed, able to move all 4 extremities,   Neuro: CNII-XII intact. , normal motor and sensation, reflexes intact  Extremities: Right lower extremity stump-clean no signs of infection related Left lower extremity amputated metatarsal-stump with multiple discoloration purple, no open patient with drainage multiple bullae formation and large bullae around the ankle area (please see pictures No pitting edema lower extremities,  ? pulses   Skin: Dry, warm to touch, negative for any Rashes,  No open wounds-Erythema and lesion presen  Wounds: per nursing documentation       D  LABs:     Latest Ref Rng &  Units 10/21/2023    7:30 AM 10/20/2023   10:00 PM 10/15/2023    4:49 PM  CBC  WBC 4.0 - 10.5 K/uL 13.1  10.4  8.0   Hemoglobin 12.0 - 15.0 g/dL 89.9  7.4  89.7   Hematocrit 36.0 - 46.0 % 30.1  22.6  31.1   Platelets 150 - 400 K/uL 238  209  266       Latest Ref Rng & Units 10/20/2023   10:00 PM 10/15/2023    4:49 PM 04/07/2023    2:15 AM  CMP  Glucose 70 - 99 mg/dL 832  783  867   BUN 6 - 20 mg/dL 26  45  18   Creatinine 0.44 - 1.00 mg/dL 8.65  8.47  8.69   Sodium 135 - 145 mmol/L 132  139  137   Potassium 3.5 - 5.1 mmol/L 3.2  3.5  3.4   Chloride 98 - 111 mmol/L 103  98  104   CO2 22 - 32 mmol/L 19  27  24    Calcium  8.9 - 10.3 mg/dL 7.2  9.2  8.7   Total Protein 6.5 - 8.1 g/dL 5.0  7.9    Total Bilirubin 0.0 - 1.2 mg/dL 0.6  1.2    Alkaline Phos 38 - 126 U/L 143  111    AST 15 - 41 U/L 81  19    ALT 0 - 44 U/L 42  12         Micro Results Recent Results (from the past 240 hours)  Culture, blood (Routine X 2) w Reflex to ID Panel     Status: None (Preliminary result)   Collection Time: 10/20/23 10:00 PM   Specimen: BLOOD LEFT HAND  Result Value Ref Range Status   Specimen Description BLOOD LEFT HAND  Final   Special Requests   Final    BOTTLES DRAWN  AEROBIC AND ANAEROBIC Blood Culture adequate volume   Culture   Final    NO GROWTH < 12 HOURS Performed at [facility], [address] [place], [place] [address]    Report Status PENDING  Incomplete  Culture, blood (Routine X 2) w Reflex to ID Panel     Status: None (Preliminary result)   Collection Time: 10/20/23 10:02 PM   Specimen: BLOOD LEFT ARM  Result Value Ref Range Status   Specimen Description BLOOD LEFT ARM  Final   Special Requests   Final    BOTTLES DRAWN AEROBIC AND ANAEROBIC Blood Culture adequate volume   Culture   Final    NO GROWTH < 12 HOURS Performed at [facility], [address] [place], [place] [address]    Report Status PENDING  Incomplete    Radiology Reports No results  found.  SIGNED: Adriana DELENA Grams, MD, FHM. FAAFP. Jolynn Pack - [facility] hospitalist Time spent - 55 min.  In seeing, evaluating and examining the patient. Reviewing medical records, labs, drawn plan of care. [facility],  Pager (please use [email or web address] to page/ text) Please use Epic Secure Chat for non-urgent communication (7AM-7PM)  If 7PM-7AM, please contact night-coverage [email or web address], 10/21/2023, 11:53 AM

## 2023-10-21 NOTE — Progress Notes (Signed)
 Pharmacy: Antimicrobial Stewardship Note  39 YOF transferred to [facility] on 6/22 from [facility] for management of a LLE foot/leg infection, with surgery now anticipated.   6/20 blood cultures at the OSH show 4/4 bottles with staph aureus - confirmed via phone this morning with micro to be MRSA.  Expect cultures will cross over into care everywhere when officially finalized on their end. ID team will see given MRSA bacteremia and need for additional work up including r/o IE or disseminated sites of infection.   Thank you for allowing pharmacy to be a part of this patient's care.  Kayla Schmitt, PharmD, BCPS, BCIDP Infectious Diseases Clinical Pharmacist 10/21/2023 2:05 PM   **Pharmacist phone directory can now be found on [email or web address] ([email or web address]).  Listed under [facility].

## 2023-10-21 NOTE — Hospital Course (Addendum)
 Kayla Schmitt is a 58 y.o. 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 At presentation to emergency department patient found to be lethargic, febrile and hypotensive. Sepsis protocol initiated and placed on vancomycin  and cefepime .  Blood cultures growing out gram-positive cocci but no clear source of infection.  Patient reported to be alert and oriented x 3 and complaining of pain in left leg.  Patient underwent CT of the chest abdomen and pelvis without clear abnormality as well as MRI of stump.   Doppler ultrasound also negative for any signs for DVT.  Noted to have purple spotty area of discoloration in the left leg.  Previously patient had ABI in left leg that was normal in May of 1.01, but now reported to be 0.63.  Transfer requested for need of vascular services.   Blood pressure still reported to be soft and patient was in the ICU, but not on pressors and O2 saturations maintained on room air. ED physician consulted vascular surgeon at [facility] recommended to transfer for further evaluation by vascular surgery.   At [facility] around patient has been treated for sepsis with endorgan dysfunction including elevated lactic acid and AKI, peripheral vascular disease with increased pain of the left lower extremity, AKI, altered mental status, DM type II and elevated troponin secondary to demand ischemia.  Patient also received 1 unit of blood transfusion as hemoglobin dropped 8.2-7.  No GI bleed at outside hospital.    Assessment & Plan:   Principal Problem:   Sepsis (HCC) Active Problems:   PVD (peripheral vascular disease) (HCC)   AKI (acute kidney injury) (HCC)    Diffuse pain in left lower extremity   Elevated troponin   Hypokalemia   Insulin  dependent type 2 diabetes mellitus (HCC)   Hypothyroidism   Acute on chronic anemia   History of GI bleed   History of coronary artery disease   Prolonged QT interval      Assessment and Plan: Sepsis-unknown source of infection at this time - Much improved -stable now  -POA: found hypotensive, tachycardic, febrile 102 F, complaining about left lower extremity pain, nausea and vomiting.  She also found to have altered mental status however with fluid resuscitation, and after treating with broad-spectrum antibiotic IV and cefepime  mentation has been improved significantly.  Patient was admitted to [facility] however never required any pressor support.  -CT chest abdomen pelvis did not reveal any foci of infection.  UA unremarkable - MRI of the left foot no evidence of osteomyelitis, abscess or focal area of infection. -Outside facility patient has been treated with IV vancomycin  and cefepime . - Previous vasculature 6/21 showed gram-positive cocci however unable to find any source of infection. - Multiple small and large bulla formation around the ankle and purple discoloration of the left-sided foot stump. - Repeating MRI of the of the left lower extremity to rule out abscess/gas formation/development of osteomyelitis.  - Continue broad-spectrum antibiotic coverage. - Rechecking blood culture.--- - S/p 1 L of LR bolus, and starting maintenance fluid LR 125 cc/h.   Peripheral vascular disease s/p bilateral lower extremity stent Diffuse left lower extremity pain   - H/o PVD, S/p bilateral transmetatarsal amputation and stenting. ((Most recently on 08/02/2022 she underwent left SFA  and above-knee popliteal percutaneous thrombectomy with JETI and additional angioplasty and stenting into the above-knee popliteal artery for an occluded distal SFA stent with tissue loss. She then on 08/06/2022 underwent laser arthrectomy  of the right SFA above-knee popliteal with DCB for in-stent stenosis. She had bilateral TMA's with Dr. Harden on 08/08/2022))  -MRI obtained did not show any evidence of osteomyelitis. -DVT ultrasound no evidence of blood clot. - Given patient has significant vasculopath with bilateral lower extremity stent, history of CAD, concern for development of worsening PAD. -ABI of the left lower extremity shows loss of tibial waveform on ABI.  - Left-sided lower extremity purple discoloration and there is a large bullae formation around the ankle.  Tender palpation and warm to touch. - Concern for early development of left lower extremity ischemia. - Starting IV heparin  drip with low-dose without any bolus.  (Patient does not have any active GI bleed however hemoglobin dropped to 7 at outside facility status post monitor blood transfusion afterward improved to  8.1.) -Continue Plavix  and Lipitor.  Holding aspirin   -Vascular surgeon Dr. Sheree has been accepted this patient to transfer to [facility].  Informed overnight Dr. Sheree stated that patient possibly angiogram of the lower extremity later this week.     Acute kidney injury -At initial presentation to ED creatinine was 2.32 which has been improved around 1.7.  Prerenal acute kidney injury in the setting of sepsis and hypotension.  Continue maintenance fluid.  Avoid nephrotoxic agent.  Continue with renal function.   Elevated troponin-secondary demand ischemia -Outside facility troponin was elevated 696>576 >>532 . No EKG changes  -  Currently patient denies any chest pain. -EKG showed normal sinus rhythm heart rate 77, prolonged QT 486 msec, normal ST and T wave. Rechecking troponin level. - Elevated troponin secondary to demand ischemia in the context of sepsis..  -Will obtain Echo:  - Patient being already on heparin  drip for the mx of concern of ischemia of the left lower extremity. -Continue Plavix  and Lipitor.      Hypokalemia/hypomagnesemia -Checking BMP to follow-up with potassium level-will replace accordingly   Acute on chronic anemia History of GI bleed secondary to AVM -Baseline hemoglobin around 10.  - Hemoglobin dropped to 7 outside facility.  Patient did not had any GI bleed.   Received 1 unit of blood transfusion OSH so far. -  Monitoring for GI bleed given patient being starting on low-dose heparin  drip and on Plavix . 10/20/23 - Hemoglobin dropped to 7.4.  Transfusing 1 unit of blood tonight.  If patient develops any active GI bleeding need to hold the heparin  drip.     Insulin -dependent DM type II -Continue Lantus , SSI coverage.   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   History of CAD -Continue  Plavix , Lipitor.   Prolonged QT -EKG showed normal sinus rhythm heart rate 77, prolonged QT 491 msec, normal ST and T wave.  Need to avoid further QT prolonging medication.

## 2023-10-22 ENCOUNTER — Inpatient Hospital Stay (HOSPITAL_COMMUNITY): Admitting: Anesthesiology

## 2023-10-22 ENCOUNTER — Other Ambulatory Visit: Payer: Self-pay

## 2023-10-22 ENCOUNTER — Encounter (HOSPITAL_COMMUNITY): Payer: Self-pay | Admitting: Internal Medicine

## 2023-10-22 ENCOUNTER — Encounter (HOSPITAL_COMMUNITY)
Admission: EM | Disposition: A | Discharge status: Rehabilitation Facility | Payer: Self-pay | Source: Other Acute Inpatient Hospital | Attending: Internal Medicine

## 2023-10-22 DIAGNOSIS — L97429 Non-pressure chronic ulcer of left heel and midfoot with unspecified severity: Secondary | ICD-10-CM

## 2023-10-22 DIAGNOSIS — I110 Hypertensive heart disease with heart failure: Secondary | ICD-10-CM

## 2023-10-22 DIAGNOSIS — I739 Peripheral vascular disease, unspecified: Secondary | ICD-10-CM

## 2023-10-22 DIAGNOSIS — I82402 Acute embolism and thrombosis of unspecified deep veins of left lower extremity: Secondary | ICD-10-CM

## 2023-10-22 DIAGNOSIS — N179 Acute kidney failure, unspecified: Secondary | ICD-10-CM | POA: Diagnosis not present

## 2023-10-22 DIAGNOSIS — M726 Necrotizing fasciitis: Secondary | ICD-10-CM | POA: Diagnosis not present

## 2023-10-22 DIAGNOSIS — A419 Sepsis, unspecified organism: Secondary | ICD-10-CM | POA: Diagnosis not present

## 2023-10-22 DIAGNOSIS — B9562 Methicillin resistant Staphylococcus aureus infection as the cause of diseases classified elsewhere: Secondary | ICD-10-CM | POA: Diagnosis not present

## 2023-10-22 DIAGNOSIS — I509 Heart failure, unspecified: Secondary | ICD-10-CM

## 2023-10-22 DIAGNOSIS — I70244 Atherosclerosis of native arteries of left leg with ulceration of heel and midfoot: Secondary | ICD-10-CM

## 2023-10-22 DIAGNOSIS — I2510 Atherosclerotic heart disease of native coronary artery without angina pectoris: Secondary | ICD-10-CM

## 2023-10-22 DIAGNOSIS — I70243 Atherosclerosis of native arteries of left leg with ulceration of ankle: Secondary | ICD-10-CM

## 2023-10-22 DIAGNOSIS — R6520 Severe sepsis without septic shock: Secondary | ICD-10-CM | POA: Diagnosis not present

## 2023-10-22 DIAGNOSIS — Z89522 Acquired absence of left knee: Secondary | ICD-10-CM

## 2023-10-22 DIAGNOSIS — R7881 Bacteremia: Secondary | ICD-10-CM | POA: Diagnosis not present

## 2023-10-22 DIAGNOSIS — L97329 Non-pressure chronic ulcer of left ankle with unspecified severity: Secondary | ICD-10-CM

## 2023-10-22 HISTORY — PX: AMPUTATION: SHX166

## 2023-10-22 LAB — CBC
HCT: 24.9 % — ABNORMAL LOW (ref 36.0–46.0)
Hemoglobin: 8.3 g/dL — ABNORMAL LOW (ref 12.0–15.0)
MCH: 29 pg (ref 26.0–34.0)
MCHC: 33.3 g/dL (ref 30.0–36.0)
MCV: 87.1 fL (ref 80.0–100.0)
Platelets: 229 10*3/uL (ref 150–400)
RBC: 2.86 MIL/uL — ABNORMAL LOW (ref 3.87–5.11)
RDW: 14.7 % (ref 11.5–15.5)
WBC: 11.4 10*3/uL — ABNORMAL HIGH (ref 4.0–10.5)
nRBC: 0 % (ref 0.0–0.2)

## 2023-10-22 LAB — TYPE AND SCREEN
ABO/RH(D): O POS
Antibody Screen: NEGATIVE
Unit division: 0

## 2023-10-22 LAB — URINALYSIS, ROUTINE W REFLEX MICROSCOPIC
Bilirubin Urine: NEGATIVE
Glucose, UA: NEGATIVE mg/dL
Ketones, ur: NEGATIVE mg/dL
Leukocytes,Ua: NEGATIVE
Nitrite: NEGATIVE
Protein, ur: NEGATIVE mg/dL
Specific Gravity, Urine: 1.016 (ref 1.005–1.030)
pH: 5 (ref 5.0–8.0)

## 2023-10-22 LAB — BPAM RBC
Blood Product Expiration Date: 202507152359
ISSUE DATE / TIME: 202506230208
Unit Type and Rh: 5100

## 2023-10-22 LAB — COMPREHENSIVE METABOLIC PANEL WITH GFR
ALT: 37 U/L (ref 0–44)
AST: 59 U/L — ABNORMAL HIGH (ref 15–41)
Albumin: <1.5 g/dL — ABNORMAL LOW (ref 3.5–5.0)
Alkaline Phosphatase: 170 U/L — ABNORMAL HIGH (ref 38–126)
Anion gap: 8 (ref 5–15)
BUN: 19 mg/dL (ref 6–20)
CO2: 20 mmol/L — ABNORMAL LOW (ref 22–32)
Calcium: 7.5 mg/dL — ABNORMAL LOW (ref 8.9–10.3)
Chloride: 107 mmol/L (ref 98–111)
Creatinine, Ser: 1.15 mg/dL — ABNORMAL HIGH (ref 0.44–1.00)
GFR, Estimated: 56 mL/min — ABNORMAL LOW (ref >60)
Glucose, Bld: 153 mg/dL — ABNORMAL HIGH (ref 70–99)
Potassium: 3.5 mmol/L (ref 3.5–5.1)
Sodium: 135 mmol/L (ref 135–145)
Total Bilirubin: 0.8 mg/dL (ref 0.0–1.2)
Total Protein: 5 g/dL — ABNORMAL LOW (ref 6.5–8.1)

## 2023-10-22 LAB — GLUCOSE, CAPILLARY
Glucose-Capillary: 140 mg/dL — ABNORMAL HIGH (ref 70–99)
Glucose-Capillary: 127 mg/dL — ABNORMAL HIGH (ref 70–99)
Glucose-Capillary: 157 mg/dL — ABNORMAL HIGH (ref 70–99)
Glucose-Capillary: 215 mg/dL — ABNORMAL HIGH (ref 70–99)
Glucose-Capillary: 243 mg/dL — ABNORMAL HIGH (ref 70–99)

## 2023-10-22 LAB — HEPARIN LEVEL (UNFRACTIONATED): Heparin Unfractionated: 0.56 [IU]/mL (ref 0.30–0.70)

## 2023-10-22 SURGERY — AMPUTATION BELOW KNEE
Anesthesia: General | Site: Knee | Laterality: Left

## 2023-10-22 MED ORDER — MIDAZOLAM HCL 2 MG/2ML IJ SOLN
INTRAMUSCULAR | Status: AC
  Filled 2023-10-22: qty 2

## 2023-10-22 MED ORDER — DEXAMETHASONE SODIUM PHOSPHATE 10 MG/ML IJ SOLN
INTRAMUSCULAR | Status: AC
  Filled 2023-10-22: qty 1

## 2023-10-22 MED ORDER — EPHEDRINE SULFATE-NACL 50-0.9 MG/10ML-% IV SOSY
PREFILLED_SYRINGE | INTRAVENOUS | Status: DC | PRN
  Administered 2023-10-22: 10 mg via INTRAVENOUS

## 2023-10-22 MED ORDER — OXYCODONE HCL 5 MG PO TABS: 5.0000 mg | ORAL_TABLET | Freq: Once | ORAL | Status: DC | PRN

## 2023-10-22 MED ORDER — CEFAZOLIN SODIUM-DEXTROSE 2-4 GM/100ML-% IV SOLN
2.0000 g | INTRAVENOUS | Status: AC
  Administered 2023-10-23: 2 g via INTRAVENOUS
  Filled 2023-10-22 (×2): qty 100

## 2023-10-22 MED ORDER — VANCOMYCIN HCL 1000 MG IV SOLR
INTRAVENOUS | Status: DC | PRN
Start: 2023-10-22 — End: 2023-10-22
  Administered 2023-10-22: 1000 mg

## 2023-10-22 MED ORDER — FENTANYL CITRATE (PF) 100 MCG/2ML IJ SOLN: 25.0000 ug | INTRAMUSCULAR | Status: DC | PRN

## 2023-10-22 MED ORDER — DEXAMETHASONE SODIUM PHOSPHATE 10 MG/ML IJ SOLN
INTRAMUSCULAR | Status: DC | PRN
  Administered 2023-10-22: 4 mg via INTRAVENOUS

## 2023-10-22 MED ORDER — KETAMINE HCL 50 MG/5ML IJ SOSY
PREFILLED_SYRINGE | INTRAMUSCULAR | Status: DC | PRN
  Administered 2023-10-22: 20 mg via INTRAVENOUS
  Administered 2023-10-22 (×2): 10 mg via INTRAVENOUS

## 2023-10-22 MED ORDER — LIDOCAINE 2% (20 MG/ML) 5 ML SYRINGE
INTRAMUSCULAR | Status: AC
Start: 2023-10-22 — End: 2023-10-22
  Filled 2023-10-22: qty 5

## 2023-10-22 MED ORDER — PHENYLEPHRINE 80 MCG/ML (10ML) SYRINGE FOR IV PUSH (FOR BLOOD PRESSURE SUPPORT)
PREFILLED_SYRINGE | INTRAVENOUS | Status: DC | PRN
Start: 2023-10-22 — End: 2023-10-22
  Administered 2023-10-22 (×4): 160 ug via INTRAVENOUS

## 2023-10-22 MED ORDER — VANCOMYCIN HCL 1000 MG IV SOLR
INTRAVENOUS | Status: AC
Start: 2023-10-22 — End: 2023-10-22
  Filled 2023-10-22: qty 20

## 2023-10-22 MED ORDER — OXYCODONE HCL 5 MG/5ML PO SOLN: 5.0000 mg | Freq: Once | ORAL | Status: DC | PRN

## 2023-10-22 MED ORDER — LACTATED RINGERS IV SOLN: INTRAVENOUS | Status: DC

## 2023-10-22 MED ORDER — VANCOMYCIN HCL 1500 MG/300ML IV SOLN: 1500.0000 mg | INTRAVENOUS | Status: DC

## 2023-10-22 MED ORDER — PROPOFOL 10 MG/ML IV BOLUS
INTRAVENOUS | Status: AC
Start: 2023-10-22 — End: 2023-10-22
  Filled 2023-10-22: qty 20

## 2023-10-22 MED ORDER — PHENYLEPHRINE 80 MCG/ML (10ML) SYRINGE FOR IV PUSH (FOR BLOOD PRESSURE SUPPORT)
PREFILLED_SYRINGE | INTRAVENOUS | Status: AC
Start: 2023-10-22 — End: 2023-10-22
  Filled 2023-10-22: qty 10

## 2023-10-22 MED ORDER — EPHEDRINE 5 MG/ML INJ
INTRAVENOUS | Status: AC
  Filled 2023-10-22: qty 5

## 2023-10-22 MED ORDER — MIDAZOLAM HCL 5 MG/5ML IJ SOLN
INTRAMUSCULAR | Status: DC | PRN
  Administered 2023-10-22 (×2): 1 mg via INTRAVENOUS

## 2023-10-22 MED ORDER — ONDANSETRON HCL 4 MG/2ML IJ SOLN
INTRAMUSCULAR | Status: AC
Start: 2023-10-22 — End: 2023-10-22
  Filled 2023-10-22: qty 2

## 2023-10-22 MED ORDER — POVIDONE-IODINE 10 % EX SWAB
2.0000 | Freq: Once | CUTANEOUS | Status: AC
  Administered 2023-10-23: 2 via TOPICAL

## 2023-10-22 MED ORDER — GABAPENTIN 100 MG PO CAPS
100.0000 mg | ORAL_CAPSULE | Freq: Three times a day (TID) | ORAL | Status: DC
  Administered 2023-10-22 – 2023-10-29 (×21): 100 mg via ORAL
  Filled 2023-10-22 (×21): qty 1

## 2023-10-22 MED ORDER — CHLORHEXIDINE GLUCONATE 0.12 % MT SOLN
15.0000 mL | Freq: Once | OROMUCOSAL | Status: AC
  Administered 2023-10-22: 15 mL via OROMUCOSAL

## 2023-10-22 MED ORDER — LIDOCAINE 2% (20 MG/ML) 5 ML SYRINGE
INTRAMUSCULAR | Status: DC | PRN
  Administered 2023-10-22: 60 mg via INTRAVENOUS

## 2023-10-22 MED ORDER — VASHE WOUND IRRIGATION OPTIME
TOPICAL | Status: DC | PRN
  Administered 2023-10-22 (×2): 34 [oz_av]

## 2023-10-22 MED ORDER — ORAL CARE MOUTH RINSE: 15.0000 mL | Freq: Once | OROMUCOSAL | Status: AC

## 2023-10-22 MED ORDER — ACETAMINOPHEN 10 MG/ML IV SOLN: 1000.0000 mg | Freq: Once | INTRAVENOUS | Status: DC | PRN

## 2023-10-22 MED ORDER — CHLORHEXIDINE GLUCONATE 4 % EX SOLN
60.0000 mL | Freq: Once | CUTANEOUS | Status: AC
  Administered 2023-10-23: 4 via TOPICAL

## 2023-10-22 MED ORDER — FENTANYL CITRATE (PF) 250 MCG/5ML IJ SOLN
INTRAMUSCULAR | Status: AC
  Filled 2023-10-22: qty 5

## 2023-10-22 MED ORDER — ONDANSETRON HCL 4 MG/2ML IJ SOLN
INTRAMUSCULAR | Status: DC | PRN
  Administered 2023-10-22: 4 mg via INTRAVENOUS

## 2023-10-22 MED ORDER — 0.9 % SODIUM CHLORIDE (POUR BTL) OPTIME
TOPICAL | Status: DC | PRN
  Administered 2023-10-22: 1000 mL

## 2023-10-22 MED ORDER — PHENYLEPHRINE HCL-NACL 20-0.9 MG/250ML-% IV SOLN
INTRAVENOUS | Status: DC | PRN
  Administered 2023-10-22: 25 ug/min via INTRAVENOUS

## 2023-10-22 MED ORDER — KETAMINE HCL 50 MG/5ML IJ SOSY
PREFILLED_SYRINGE | INTRAMUSCULAR | Status: AC
  Filled 2023-10-22: qty 5

## 2023-10-22 MED ORDER — PROPOFOL 10 MG/ML IV BOLUS
INTRAVENOUS | Status: DC | PRN
  Administered 2023-10-22: 180 mg via INTRAVENOUS

## 2023-10-22 MED ORDER — FENTANYL CITRATE (PF) 250 MCG/5ML IJ SOLN
INTRAMUSCULAR | Status: DC | PRN
  Administered 2023-10-22 (×3): 50 ug via INTRAVENOUS

## 2023-10-22 SURGICAL SUPPLY — 49 items
BAG COUNTER SPONGE SURGICOUNT (BAG) ×2 IMPLANT
BANDAGE ESMARK 6X9 LF (GAUZE/BANDAGES/DRESSINGS) IMPLANT
BLADE SAW SGTL 73X25 THK (BLADE) ×2 IMPLANT
BNDG COHESIVE 6X5 TAN ST LF (GAUZE/BANDAGES/DRESSINGS) ×2 IMPLANT
BNDG ELASTIC 4X5.8 VLCR STR LF (GAUZE/BANDAGES/DRESSINGS) ×2 IMPLANT
BNDG ELASTIC 6INX 5YD STR LF (GAUZE/BANDAGES/DRESSINGS) ×2 IMPLANT
BNDG GAUZE DERMACEA FLUFF 4 (GAUZE/BANDAGES/DRESSINGS) ×4 IMPLANT
CANISTER SUCTION 3000ML PPV (SUCTIONS) ×2 IMPLANT
CANISTER WOUNDNEG PRESSURE 500 (CANNISTER) IMPLANT
CLEANSER WND VASHE INSTL 34OZ (WOUND CARE) IMPLANT
CLIP TI LARGE 6 (CLIP) IMPLANT
CLIP TI MEDIUM 6 (CLIP) ×2 IMPLANT
CLIP TI WIDE RED SMALL 6 (CLIP) ×2 IMPLANT
COVER BACK TABLE 60X90IN (DRAPES) IMPLANT
COVER SURGICAL LIGHT HANDLE (MISCELLANEOUS) ×2 IMPLANT
DRAIN CHANNEL 19F RND (DRAIN) IMPLANT
DRAPE HALF SHEET 40X57 (DRAPES) ×2 IMPLANT
DRAPE INCISE 23X17 STRL (DRAPES) ×2 IMPLANT
DRAPE INCISE IOBAN 23X17 STRL (DRAPES) ×1 IMPLANT
DRAPE INCISE IOBAN 66X45 STRL (DRAPES) ×2 IMPLANT
DRAPE SURG ORHT 6 SPLT 77X108 (DRAPES) ×4 IMPLANT
DRESSING VERAFLO CLEANSE CC (GAUZE/BANDAGES/DRESSINGS) IMPLANT
ELECTRODE REM PT RTRN 9FT ADLT (ELECTROSURGICAL) ×2 IMPLANT
EVACUATOR SILICONE 100CC (DRAIN) IMPLANT
GAUZE PAD ABD 8X10 STRL (GAUZE/BANDAGES/DRESSINGS) ×2 IMPLANT
GAUZE SPONGE 4X4 12PLY STRL (GAUZE/BANDAGES/DRESSINGS) ×4 IMPLANT
GAUZE XEROFORM 1X8 LF (GAUZE/BANDAGES/DRESSINGS) ×2 IMPLANT
GLOVE BIOGEL PI IND STRL 8 (GLOVE) ×2 IMPLANT
GOWN STRL REUS W/ TWL LRG LVL3 (GOWN DISPOSABLE) ×4 IMPLANT
GOWN STRL REUS W/TWL 2XL LVL3 (GOWN DISPOSABLE) ×4 IMPLANT
KIT BASIN OR (CUSTOM PROCEDURE TRAY) ×2 IMPLANT
KIT TURNOVER KIT B (KITS) ×2 IMPLANT
NS IRRIG 1000ML POUR BTL (IV SOLUTION) ×2 IMPLANT
PACK GENERAL/GYN (CUSTOM PROCEDURE TRAY) ×2 IMPLANT
PAD ARMBOARD POSITIONER FOAM (MISCELLANEOUS) ×4 IMPLANT
PAD NEG PRESSURE SENSATRAC (MISCELLANEOUS) IMPLANT
SPONGE T-LAP 18X18 ~~LOC~~+RFID (SPONGE) IMPLANT
STAPLER SKIN PROX 35W (STAPLE) ×2 IMPLANT
STOCKINETTE IMPERVIOUS LG (DRAPES) ×2 IMPLANT
SUT ETHILON 2 0 PSLX (SUTURE) IMPLANT
SUT ETHILON 3 0 PS 1 (SUTURE) IMPLANT
SUT SILK 2 0 SH CR/8 (SUTURE) ×2 IMPLANT
SUT SILK 2-0 18XBRD TIE 12 (SUTURE) ×2 IMPLANT
SUT SILK 3-0 18XBRD TIE 12 (SUTURE) ×2 IMPLANT
SUT VIC AB 2-0 CT1 18 (SUTURE) ×4 IMPLANT
SUT VIC AB 3-0 SH 8-18 (SUTURE) ×2 IMPLANT
TOWEL GREEN STERILE (TOWEL DISPOSABLE) ×4 IMPLANT
UNDERPAD 30X36 HEAVY ABSORB (UNDERPADS AND DIAPERS) ×2 IMPLANT
WATER STERILE IRR 1000ML POUR (IV SOLUTION) ×2 IMPLANT

## 2023-10-22 NOTE — Progress Notes (Signed)
 [facility]  Date of Admission:  10/20/2023     Reason for Follow Up: Necrotizing fasciitis (HCC)  Total days of antibiotics 2         ASSESSMENT:  Kayla Schmitt is status post left BKA in the setting of MRSA bacteremia and necrotizing fasciitis.  Plans to return to the OR tomorrow with orthopedics for revision excisional debridement.  Blood cultures from 10/20/2023 remain without growth.  Continue to monitor for clearance of bacteremia.  Will need TEE to check for endocarditis.  Discontinue cefepime  and continue current dose of vancomycin .  Postoperative wound care per vascular surgery/orthopedics.  Optimize protein intake for healing.  Continue contact precautions for MRSA.  Remaining medical and supportive care per internal medicine.  PLAN:  Discontinue cefepime  and continue current dose of vancomycin . Therapeutic drug monitoring of renal function and vancomycin  levels. Monitor blood cultures for clearance of bacteremia. Will need TEE to rule out endocarditis. OR tomorrow for revision of excisional debridement. Contact precautions for MRSA. Remaining medical and supportive care per Internal Medicine.   Principal Problem:   Necrotizing fasciitis (HCC) Active Problems:   MRSA bacteremia   PVD (peripheral vascular disease) (HCC)   Insulin  dependent type 2 diabetes mellitus (HCC)   Hypothyroidism   Acute on chronic anemia   Sepsis (HCC)   AKI (acute kidney injury) (HCC)   Diffuse pain in left lower extremity   History of GI bleed   Elevated troponin   Hypokalemia   History of coronary artery disease   Prolonged QT interval   Gangrene of left foot (HCC)    atorvastatin   40 mg Oral Daily   chlorhexidine   60 mL Topical Once   clopidogrel   75 mg Oral Daily   ferrous sulfate   325 mg Oral Q breakfast   insulin  aspart  0-5 Units Subcutaneous QHS   insulin  aspart  0-6 Units Subcutaneous TID WC   insulin  glargine-yfgn  10 Units Subcutaneous QHS    levothyroxine   150 mcg Oral QAC breakfast   pantoprazole   40 mg Oral BID   potassium chloride   40 mEq Oral Once   povidone-iodine   2 Application Topical Once   sodium chloride  flush  10-40 mL Intracatheter Q12H    SUBJECTIVE:  Afebrile overnight with no acute events.  Now status post left below-knee amputation.  Had increased levels of pain at the amputation site earlier which is now improved.  Tolerating antibiotics with no adverse side effects.  Allergies  Allergen Reactions   Trental  [Pentoxifylline ] Nausea And Vomiting   Vibramycin [Doxycycline] Nausea And Vomiting   Penicillins Rash    Tolerated cephalosporins 07/2022      Review of Systems: Review of Systems  Constitutional:  Negative for chills, fever and weight loss.  Respiratory:  Negative for cough, shortness of breath and wheezing.   Cardiovascular:  Negative for chest pain and leg swelling.  Gastrointestinal:  Negative for abdominal pain, constipation, diarrhea, nausea and vomiting.  Skin:  Negative for rash.      OBJECTIVE: Vitals:   10/22/23 0951 10/22/23 0952 10/22/23 1017 10/22/23 1121  BP: 136/60   138/61  Pulse: 90 90 88 99  Resp: 17 20    Temp:  98.9 F (37.2 C) 97.6 F (36.4 C)   TempSrc:   Axillary   SpO2: 95% 94% 97% 97%  Weight:      Height:       Body mass index is 38.07 kg/m.  Physical Exam Constitutional:  General: She is not in acute distress.    Appearance: She is well-developed.   Cardiovascular:     Rate and Rhythm: Normal rate and regular rhythm.     Heart sounds: Normal heart sounds.  Pulmonary:     Effort: Pulmonary effort is normal.     Breath sounds: Normal breath sounds.   Skin:    General: Skin is warm and dry.   Neurological:     Mental Status: She is alert.   Psychiatric:        Mood and Affect: Mood normal.     Lab Results Lab Results  Component Value Date   WBC 11.4 (H) 10/22/2023   HGB 8.3 (L) 10/22/2023   HCT 24.9 (L) 10/22/2023   MCV 87.1  10/22/2023   PLT 229 10/22/2023    Lab Results  Component Value Date   CREATININE 1.15 (H) 10/22/2023   BUN 19 10/22/2023   NA 135 10/22/2023   K 3.5 10/22/2023   CL 107 10/22/2023   CO2 20 (L) 10/22/2023    Lab Results  Component Value Date   ALT 37 10/22/2023   AST 59 (H) 10/22/2023   ALKPHOS 170 (H) 10/22/2023   BILITOT 0.8 10/22/2023     Microbiology: Recent Results (from the past 240 hours)  Culture, blood (Routine X 2) w Reflex to ID Panel     Status: None (Preliminary result)   Collection Time: 10/20/23 10:00 PM   Specimen: BLOOD LEFT HAND  Result Value Ref Range Status   Specimen Description BLOOD LEFT HAND  Final   Special Requests   Final    BOTTLES DRAWN AEROBIC AND ANAEROBIC Blood Culture adequate volume   Culture   Final    NO GROWTH 2 DAYS Performed at [facility], [address], [place], [place] [address]    Report Status PENDING  Incomplete  Culture, blood (Routine X 2) w Reflex to ID Panel     Status: None (Preliminary result)   Collection Time: 10/20/23 10:02 PM   Specimen: BLOOD LEFT ARM  Result Value Ref Range Status   Specimen Description BLOOD LEFT ARM  Final   Special Requests   Final    BOTTLES DRAWN AEROBIC AND ANAEROBIC Blood Culture adequate volume   Culture   Final    NO GROWTH 2 DAYS Performed at [facility], [address] [place], [place] [address]    Report Status PENDING  Incomplete  Surgical pcr screen     Status: None   Collection Time: 10/21/23  9:19 PM   Specimen: Nasal Mucosa; Nasal Swab  Result Value Ref Range Status   MRSA, PCR NEGATIVE NEGATIVE Final   Staphylococcus aureus NEGATIVE NEGATIVE Final    Comment: (NOTE) The Xpert SA Assay (FDA approved for NASAL specimens in patients 21 years of age and older), is one component of a comprehensive surveillance program. It is not intended to diagnose infection nor to guide or monitor treatment. Performed at [facility], [address], [place],  [place] [address]    I have personally spent 26 minutes involved in face-to-face and non-face-to-face activities for this patient on the day of the visit. Professional time spent includes the following activities: preparing to see the patient (review of tests), performing a medically appropriate examination, ordering medications/tests/procedures, communicating with other health care professionals, documenting clinical information in the EMR, communicating results to the patient/family, counseling and educating the patient/family and care coordination.   Greg Lavina Resor, NP  [facility]  10/22/2023  2:14 PM

## 2023-10-22 NOTE — Anesthesia Preprocedure Evaluation (Signed)
 Anesthesia Evaluation  Patient identified by MRN, date of birth, ID band Patient awake    Reviewed: Allergy & Precautions, NPO status , Patient's Chart, lab work & pertinent test results  History of Anesthesia Complications (+) PONV and history of anesthetic complications  Airway Mallampati: III  TM Distance: >3 FB Neck ROM: Full    Dental  (+) Edentulous Upper, Loose,    Pulmonary neg shortness of breath, neg sleep apnea, neg COPD, neg recent URI, former smoker   breath sounds clear to auscultation       Cardiovascular hypertension, Pt. on medications and Pt. on home beta blockers + CAD, + Past MI, + Peripheral Vascular Disease and +CHF  + Valvular Problems/Murmurs AS and MR  Rhythm:Regular + Systolic murmurs 1. Left ventricular ejection fraction, by estimation, is 50 to 55%. The  left ventricle has low normal function. The left ventricle has no regional  wall motion abnormalities. Left ventricular diastolic parameters are  consistent with Grade II diastolic  dysfunction (pseudonormalization).   2. Right ventricular systolic function was not well visualized. The right  ventricular size is not well visualized.   3. The mitral valve is degenerative. Moderate mitral valve regurgitation.  No evidence of mitral stenosis.   4. The aortic valve is normal in structure. There is severe calcifcation  of the aortic valve. There is severe thickening of the aortic valve.  Aortic valve regurgitation is mild. Mild to moderate aortic valve  stenosis. Aortic valve mean gradient measures   17.0 mmHg. Aortic valve Vmax measures 2.66 m/s.   5. The inferior vena cava is normal in size with greater than 50%  respiratory variability, suggesting right atrial pressure of 3 mmHg.      Ost LAD to Prox LAD lesion is 75% stenosed.   Ost Cx to Prox Cx lesion is 100% stenosed.   1st Diag lesion is 25% stenosed.   Mid RCA lesion is 70% stenosed.    Mid LM to Dist LM lesion is 40% stenosed.   Mid LAD lesion is 70% stenosed.   LV end diastolic pressure is moderately elevated.   Hemodynamic findings consistent with mild pulmonary hypertension.   There is no aortic valve stenosis.   Aortic saturation 91%, PA saturation 60%, mean RA pressure 9 mmHg, PA pressure 42/24, mean PA pressure 32 mmHg, mean pulmonary capillary wedge pressure 24 mmHg, V waves noted to 45 mmHg.  Cardiac output 8.58 L/min, cardiac index 4.26.   Severe, calcific three-vessel coronary artery disease.  Severe mitral regurgitation by noninvasive workup.  Significant V waves noted on the wedge tracing.  Will obtain cardiac surgery consult for CABG and mitral valve repair.       Neuro/Psych neg Seizures  Neuromuscular disease    GI/Hepatic Neg liver ROS,GERD  ,,  Endo/Other  diabetesHypothyroidism    Renal/GU Renal InsufficiencyRenal diseaseLab Results      Component                Value               Date                      NA                       135                 10/22/2023  K                        3.5                 10/22/2023                CO2                      20 (L)              10/22/2023                GLUCOSE                  153 (H)             10/22/2023                BUN                      19                  10/22/2023                CREATININE               1.15 (H)            10/22/2023                CALCIUM                   7.5 (L)             10/22/2023                GFRNONAA                 56 (L)              10/22/2023                Musculoskeletal  (+) Arthritis ,    Abdominal   Peds  Hematology  (+) Blood dyscrasia, anemia Lab Results      Component                Value               Date                      WBC                      11.4 (H)            10/22/2023                HGB                      8.3 (L)             10/22/2023                HCT                      24.9 (L)             10/22/2023                MCV  87.1                10/22/2023                PLT                      229                 10/22/2023              Anesthesia Other Findings   Reproductive/Obstetrics                              Anesthesia Physical Anesthesia Plan  ASA: 3  Anesthesia Plan: General   Post-op Pain Management:    Induction: Intravenous  PONV Risk Score and Plan: 4 or greater and Ondansetron  and Dexamethasone   Airway Management Planned: LMA  Additional Equipment: None  Intra-op Plan:   Post-operative Plan: Extubation in OR  Informed Consent: I have reviewed the patients History and Physical, chart, labs and discussed the procedure including the risks, benefits and alternatives for the proposed anesthesia with the patient or authorized representative who has indicated his/her understanding and acceptance.     Dental advisory given  Plan Discussed with: CRNA  Anesthesia Plan Comments:          Anesthesia Quick Evaluation

## 2023-10-22 NOTE — Progress Notes (Signed)
 PHARMACY - ANTICOAGULATION CONSULT NOTE  Pharmacy Consult for Heparin  Indication: LLE ischemia   Patient Measurements: Height: 5' 4 (162.6 cm) Weight: 100.6 kg (221 lb 12.5 oz) IBW/kg (Calculated) : 54.7 HEPARIN  DW (KG): 78  Vital Signs: Temp: 98.5 F (36.9 C) (06/24 0020) Temp Source: Oral (06/24 0020) BP: 123/61 (06/24 0020) Pulse Rate: 73 (06/24 0020)  Labs: Recent Labs    10/20/23 2200 10/21/23 0730 10/21/23 1230 10/21/23 1714 10/22/23 0218  HGB 7.4* 10.0*  --   --  8.3*  HCT 22.6* 30.1*  --   --  24.9*  PLT 209 238  --   --  229  HEPARINUNFRC  --  0.26*  --  0.27* 0.56  CREATININE 1.34*  --  1.25*  --   --   TROPONINIHS 532* 501*  --   --   --     Estimated Creatinine Clearance: 58 mL/min (A) (by C-G formula based on SCr of 1.25 mg/dL (H)).   Medical History: Past Medical History:  Diagnosis Date   Anemia    Aortic stenosis    Arthritis    CHF (congestive heart failure) (HCC)    Coronary artery disease    Diabetes mellitus without complication (HCC)    type 2   GERD (gastroesophageal reflux disease)    Heart murmur    History of blood transfusion    Hypercholesteremia    Hypertension    Hypothyroidism    Mitral regurgitation    Myocardial infarction (HCC)    Peripheral vascular disease (HCC)    PONV (postoperative nausea and vomiting)    Thyroid disease    Assessment: 58 yo F with hx PAD s/p vascular bypass surgeries and bilateral transmetatarsal amputations, presented to [facility] with LLE pain on 6/20.  Request transfer to [facility] for [facility]VS evaluation.  Pharmacy consulted to start IV heparin .  Of note, pt was on SQ heparin  for VTE ppx - last dose 6/20 @ 1600.  (Pt refused subsequent doses). Physician has requested no bolus and lower goal given patient's ongoing anemia.    Heparin  level now supra-therapeutic at 0.56 No bleeding or infusion issues per RN   Goal of Therapy:  Heparin  level 0.3-0.5 units/ml Monitor platelets by anticoagulation  protocol: Yes   Plan:  Decrease heparin  to 1350 units/h Repeat heparin  level in 8h CBC/ heparin  level daily   Lynwood Poplar, PharmD, BCPS Clinical Pharmacist 10/22/2023 2:45 AM

## 2023-10-22 NOTE — Transfer of Care (Signed)
 Immediate Anesthesia Transfer of Care Note  Patient: Kayla Schmitt  Procedure(s) Performed: AMPUTATION BELOW KNEE (Left: Knee)  Patient Location: PACU  Anesthesia Type:General  Level of Consciousness: drowsy and patient cooperative  Airway & Oxygen  Therapy: Patient Spontanous Breathing and Patient connected to face mask oxygen   Post-op Assessment: Report given to RN and Post -op Vital signs reviewed and stable  Post vital signs: Reviewed and stable  Last Vitals:  Vitals Value Taken Time  BP 121/55 10/22/23 09:03  Temp    Pulse 86 10/22/23 09:05  Resp 18 10/22/23 09:05  SpO2 100 % 10/22/23 09:05  Vitals shown include unfiled device data.  Last Pain:  Vitals:   10/22/23 0658  TempSrc:   PainSc: 0-No pain         Complications: No notable events documented.

## 2023-10-22 NOTE — Progress Notes (Signed)
 [facility]       A/P: necrotizing fasciitis with her LRINEC score of 7.   Patient pre op for revision excisional debridement 10/23/23 with Dr. Harden NPO past MN and consent order placed.  Patient agrees to proceed.   Maurilio Deland Collet PA-C

## 2023-10-22 NOTE — Progress Notes (Signed)
 Pt is scheduled for LLE below the knee amputation at 715 this am. Pt has IV Heparin  infusing-no orders on chart addressing wether to d/c on call or continue infusion. Provider on call, Dr Gretta paged to clarify. Order received to hold on call to OR

## 2023-10-22 NOTE — Op Note (Signed)
    NAME: Kayla Schmitt    MRN: [id number] DOB: Jun 07, 1965    DATE OF OPERATION: 10/22/2023  PREOP DIAGNOSIS:    Left lower extremity tissue loss with infection  POSTOP DIAGNOSIS:    Same  PROCEDURE:    Left lower extremity below-knee amputation left open for future revision Vancomycin  powder placement Wound VAC placement  SURGEON: Fonda FORBES Rim  ASSIST: Curry Damme, PA  ANESTHESIA: General  EBL: 100 mL  INDICATIONS:    Kayla Schmitt is a 58 y.o. female well-known to the vascular surgery service having previously undergone left lower extremity stents.  She was found down last week after several days of nausea and vomiting.  She presented to hospital with mottled left lower extremity involving ankle forefoot and heel.  The foot was nonsalvageable.  There was some concern for infection, therefore I was asked by my colleagues to perform a below-knee amputation, left open for future revision once the infection is resolved.  After discussing the risks and benefits, she elected to proceed.  FINDINGS:   Unhealthy but viable left calf muscles with significant edema.  Pulsatile blood flow.  TECHNIQUE:   After informed consent was obtained, the patient was brought to the OR laid in the supine position.  General anesthesia was induced, and the patient's left leg was prepped and draped in standard fashion.  Incisions were marked for a below the knee amputation about 10 cm below the tibial tuberosity. The skin, subcutaneous tissue, muscle and fascia were divided with cautery.  Muscle appeared unhealthy, but viable.  There is significant edema.  Pulsatile bleeding.  Hemostats were applied to vascular structures.  The tibia and fibula were dissected free of their attachments. Periosteal elevators were used to dissect these cephalad. The fibula was cut with a rib cutter cephalad to the skin incision. The tibia was transected with an stryker saw. The anterior aspect of the  bone was bevelled. All sharp aspects of the bone edge were smoothed with a bone rasp. The posterior flap was cut with an amputation knife. Hemostasis was achieved with 3-0 silk stick ties and electrocautery. The flap was measured and trimmed to fit the anterior skin border. The flap was irrigated with copious warm saline, Vashe, and VAC powder was placed in the wound bed.  Residual portion of the soleus was brought up and covered to bone as well as stumps of the arteries.  Next, between the soleus and gastrocnemius muscle, blue VAC sponge was placed.  The flap was approximated with three 2-0 nylon sutures in vertical mattress fashion.  The open flap was then wrapped with VAC tape as well as Ioban and placed to suction at 125 mmHg.  Anesthesia was successfully terminated. The patient was brought to the recovery room in stable condition.    Plan for open surgical revision with Dr. Harden in the coming days.  Fonda FORBES Rim, MD [facility] DATE OF DICTATION:   10/22/2023

## 2023-10-22 NOTE — Anesthesia Preprocedure Evaluation (Signed)
 Anesthesia Evaluation  Patient identified by MRN, date of birth, ID band Patient awake    Reviewed: Allergy & Precautions, NPO status , Patient's Chart, lab work & pertinent test results  History of Anesthesia Complications (+) PONV and history of anesthetic complications  Airway Mallampati: III  TM Distance: >3 FB Neck ROM: Full    Dental  (+) Dental Advisory Given   Pulmonary neg shortness of breath, neg sleep apnea, neg COPD, neg recent URI, former smoker   Pulmonary exam normal breath sounds clear to auscultation       Cardiovascular hypertension, Pt. on home beta blockers + CAD, + Past MI, + Peripheral Vascular Disease and +CHF  + dysrhythmias (prolonged QT) + Valvular Problems/Murmurs (moderate MR, mild-to-moderate AS)  Rhythm:Regular Rate:Normal  HLD  TTE 10/21/2023: IMPRESSIONS     1. Left ventricular ejection fraction, by estimation, is 50 to 55%. The  left ventricle has low normal function. The left ventricle has no regional  wall motion abnormalities. Left ventricular diastolic parameters are  consistent with Grade II diastolic  dysfunction (pseudonormalization).   2. Right ventricular systolic function was not well visualized. The right  ventricular size is not well visualized.   3. The mitral valve is degenerative. Moderate mitral valve regurgitation.  No evidence of mitral stenosis.   4. The aortic valve is normal in structure. There is severe calcifcation  of the aortic valve. There is severe thickening of the aortic valve.  Aortic valve regurgitation is mild. Mild to moderate aortic valve  stenosis. Aortic valve mean gradient measures   17.0 mmHg. Aortic valve Vmax measures 2.66 m/s.   5. The inferior vena cava is normal in size with greater than 50%  respiratory variability, suggesting right atrial pressure of 3 mmHg.     Neuro/Psych neg Seizures    GI/Hepatic Neg liver ROS,GERD  Medicated,,   Endo/Other  diabetes, Type 2, Insulin  DependentHypothyroidism    Renal/GU      Musculoskeletal  (+) Arthritis ,    Abdominal  (+) + obese  Peds  Hematology  (+) Blood dyscrasia, anemia Lab Results      Component                Value               Date                      WBC                      11.4 (H)            10/22/2023                HGB                      8.3 (L)             10/22/2023                HCT                      24.9 (L)            10/22/2023                MCV                      87.1  10/22/2023                PLT                      229                 10/22/2023              Anesthesia Other Findings On Plavix   Reproductive/Obstetrics                             Anesthesia Physical Anesthesia Plan  ASA: 3  Anesthesia Plan: MAC and Regional   Post-op Pain Management: Regional block* and Tylenol  PO (pre-op)*   Induction: Intravenous  PONV Risk Score and Plan: 3 and Ondansetron , Dexamethasone , Propofol  infusion, TIVA and Treatment may vary due to age or medical condition  Airway Management Planned: Natural Airway and Simple Face Mask  Additional Equipment:   Intra-op Plan:   Post-operative Plan:   Informed Consent: I have reviewed the patients History and Physical, chart, labs and discussed the procedure including the risks, benefits and alternatives for the proposed anesthesia with the patient or authorized representative who has indicated his/her understanding and acceptance.     Dental advisory given  Plan Discussed with: CRNA and Anesthesiologist  Anesthesia Plan Comments: (Discussed potential risks of nerve blocks including, but not limited to, infection, bleeding, nerve damage, seizures, pneumothorax, respiratory depression, and potential failure of the block. Alternatives to nerve blocks discussed. All questions answered.  Discussed with patient risks of MAC including, but not limited  to, minor pain or discomfort, hearing people in the room, and possible need for backup general anesthesia. Risks for general anesthesia also discussed including, but not limited to, sore throat, hoarse voice, chipped/damaged teeth, injury to vocal cords, nausea and vomiting, allergic reactions, lung infection, heart attack, stroke, and death. All questions answered. )       Anesthesia Quick Evaluation

## 2023-10-22 NOTE — Progress Notes (Signed)
 PROGRESS NOTE    Patient: Kayla Schmitt                            PCP: [name], NP                    DOB: 11-05-1965            DOA: 10/20/2023 FMW:[id number]             DOS: 10/22/2023, 11:30 AM   LOS: 2 days   Date of Service: The patient was seen and examined on 10/22/2023  Subjective:   Patient was seen and examined this morning, stable N.p.o. Pending for possible evaluation for BKA today by Dr. Harden For revision excisional debridement  MRI positive for vascular ischemia (necrotizing fasciitis)  Brief Narrative:   Kayla Schmitt is a 58 y.o. 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 At presentation to emergency department patient found to be lethargic, febrile and hypotensive. Sepsis protocol initiated and placed on vancomycin  and cefepime .  Blood cultures growing out gram-positive cocci but no clear source of infection.  Patient reported to be alert and oriented x 3 and complaining of pain in left leg.  Patient underwent CT of the chest abdomen and pelvis without clear abnormality as well as MRI of stump.   Doppler ultrasound also negative for any signs for DVT.  Noted to have purple spotty area of discoloration in the left leg.  Previously patient had ABI in left leg that was normal in May of 1.01, but now reported to be 0.63.  Transfer requested for need of vascular services.   Blood pressure still reported to be soft and patient was in the ICU, but not on pressors and O2 saturations maintained on room air. ED physician consulted vascular surgeon at [facility] recommended to transfer for further evaluation by vascular surgery.    At [facility] around patient has been treated for sepsis with endorgan dysfunction including elevated lactic acid and AKI, peripheral vascular disease with increased pain of the left lower extremity, AKI, altered mental status, DM type II and elevated troponin secondary to demand ischemia.  Patient also received 1 unit of blood transfusion as hemoglobin dropped 8.2-7.  No GI bleed at outside hospital.    Assessment & Plan:   Principal Problem:   Sepsis (HCC) Active Problems:   PVD (peripheral vascular disease) (HCC)   AKI (acute kidney injury) (HCC)   Diffuse pain in left lower extremity   Elevated troponin   Hypokalemia   Insulin  dependent type 2 diabetes mellitus (HCC)   Hypothyroidism   Acute on chronic anemia   History of GI bleed   History of coronary artery disease   Prolonged QT interval      Assessment and Plan:   Peripheral vascular disease s/p bilateral lower extremity stent Diffuse left lower extremity pain  Development of left stump Necrotizing fasciitis with ischemia:    10/22/23  - to OR BKA today by Dr. Harden For revision excisional debridement  MRI positive for vascular ischemia -no evidence of osteomyelitis  ------------------------------------------------------------------------------------------------------------------- - H/o PVD, S/p bilateral transmetatarsal amputation and stenting. ((Most recently on 08/02/2022 she underwent  left SFA and above-knee popliteal percutaneous thrombectomy with JETI and additional angioplasty and stenting into the above-knee popliteal artery for an occluded distal SFA stent with tissue loss. She then on 08/06/2022 underwent laser arthrectomy of the right SFA above-knee popliteal with DCB for in-stent stenosis. She had bilateral TMA's with Dr. Harden on 08/08/2022))  -MRI obtained did not show any evidence of osteomyelitis. -DVT ultrasound no evidence of blood clot. - Given patient has significant vasculopath with bilateral lower extremity  stent, history of CAD, concern for development of worsening PAD. -ABI of the left lower extremity shows loss of tibial waveform on ABI.  - Left-sided lower extremity purple discoloration and there is a large bullae formation around the ankle.  Tender palpation and warm to touch. - Concern for early development of left lower extremity ischemia.  - Starting IV heparin  drip with low-dose without any bolus.  (Patient does not have any active GI bleed however hemoglobin dropped to 7 at outside facility status post monitor blood transfusion afterward improved to  8.1.)  -Continue Plavix  and Lipitor.  Holding aspirin   -Vascular surgeon Dr. Sheree has been accepted this patient to transfer to [facility].   -On arrival Dr. Harden has been following closely    Sepsis-unknown source of infection at this time - Much improved -hemodynamically stable  In outside facility [facility] -POA: found hypotensive, tachycardic, febrile 102 F, complaining about left lower extremity pain, nausea and vomiting.  And altered mental status, responded to IV fluids, IV antibiotics Patient was admitted to [facility] however never required any pressor support.  -CT chest abdomen pelvis did not reveal any foci of infection.  UA unremarkable - MRI of the left foot no evidence of osteomyelitis, abscess or focal area of infection. -Outside facility patient has been treated with IV vancomycin  and cefepime . - Previous vasculature 6/21 showed gram-positive cocci however unable to find any source of infection.  - Multiple small and large bulla formation around the ankle and purple discoloration of the left-sided foot stump. - MRI revealed no abscess gas or finding consistent with osteomyelitis, finding consistent with possible necrotizing fasciitis  - Rechecking blood culture.-Repeat blood cultures>>> no growth to date - Continue IV fluid maintenance- continue IV ABX     Acute kidney injury -POA: Cr;.2.32 which has been  improved around 1.7.   Prerenal acute kidney injury in the setting of sepsis and hypotension.   Continue maintenance fluid.  Avoid nephrotoxic agent.  Continue with renal function.   Elevated troponin-secondary demand ischemia -Outside facility troponin was elevated 696>576 >>532 . No EKG changes  -  Currently patient denies any chest pain. -EKG showed normal sinus rhythm heart rate 77, prolonged QT 486 msec, normal ST and T wave. Rechecking troponin level. - Elevated troponin secondary to demand ischemia in the context of sepsis.SABRA  - Echo reviewed: EF 50 to 55%. No LVH,  Left ventricular diastolic parameters are  consistent with Grade II diastolic  - Patient being already on heparin  drip for the mx of concern of ischemia of the left lower extremity. -Continue Plavix  and Lipitor.     Hypokalemia/hypomagnesemia -Checking BMP to follow-up with potassium level-will replace accordingly   Acute on chronic anemia History of GI bleed secondary to AVM -Baseline hemoglobin around 10.  - Hemoglobin dropped to 7 outside facility.  Patient did not had any GI bleed.   Received 1 unit of blood transfusion OSH so far. -  Monitoring for GI bleed given patient being starting on low-dose heparin   drip and on Plavix .  - Hemoglobin dropped to 7.4.  >>>Transfusing 1 unit PRBC >>> 8.3 now   If patient develops any active GI bleeding need to hold the heparin  drip.     Insulin -dependent DM type II -Continue Lantus , SSI coverage.   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 -Will repeat echo EF improved to 50-55%, grade 2 diastolic dysfunction   History of CAD -Continue  Plavix , Lipitor.   Prolonged QT -EKG showed normal sinus rhythm heart rate 77, prolonged QT 491 msec, normal ST and T wave.  Need to avoid further QT prolonging  medication.    ------------------------------------------------------------------------------------------- Nutritional status:  The patient's BMI is: Body mass index is 38.07 kg/m. I agree with the assessment and plan as outlined       Skin Assessment: Lower extremity metatarsal amputation potation's, left lower extremity discoloration with large volume No open wounds or drainage:     ------------------------------------------------------------------------------------------- Cultures; Blood Cultures x 2 >> Gram-positive cocci   DVT prophylaxis:  SCDs Start: 10/20/23 2037 Heparin  drip-on hold  Code Status:   Code Status: Full Code  Family Communication: Family member present at bedside-updated  Admission status:   Status is: Inpatient Remains inpatient appropriate because: Needing IV heparin , IV antibiotics, vascular evaluation   Disposition: From  - home             Planning for discharge in >2 days: to   Procedures:   No admission procedures for hospital encounter.   Antimicrobials:  Anti-infectives (From admission, onward)    Start     Dose/Rate Route Frequency Ordered Stop   10/22/23 0810  vancomycin  (VANCOCIN ) powder  Status:  Discontinued          As needed 10/22/23 0811 10/22/23 0901   10/21/23 2200  vancomycin  (VANCOCIN ) IVPB 1000 mg/200 mL premix  Status:  Discontinued       Placed in Followed by Linked Group   1,000 mg 200 mL/hr over 60 Minutes Intravenous Every 24 hours 10/20/23 2029 10/20/23 2130   10/21/23 1400  vancomycin  (VANCOCIN ) IVPB 1000 mg/200 mL premix        1,000 mg 200 mL/hr over 60 Minutes Intravenous Every 24 hours 10/20/23 2212     10/21/23 1000  ceFEPIme  (MAXIPIME ) 2 g in sodium chloride  0.9 % 100 mL IVPB  Status:  Discontinued        2 g 200 mL/hr over 30 Minutes Intravenous Every 12 hours 10/20/23 2212 10/22/23 1118   10/20/23 2200  vancomycin  (VANCOREADY) IVPB 2000 mg/400 mL  Status:  Discontinued       Placed in Followed  by Linked Group   2,000 mg 200 mL/hr over 120 Minutes Intravenous  Once 10/20/23 2029 10/20/23 2130   10/20/23 2100  ceFEPIme  (MAXIPIME ) 2 g in sodium chloride  0.9 % 100 mL IVPB  Status:  Discontinued        2 g 200 mL/hr over 30 Minutes Intravenous Every 12 hours 10/20/23 2029 10/20/23 2212        Medication:   atorvastatin   40 mg Oral Daily   clopidogrel   75 mg Oral Daily   ferrous sulfate   325 mg Oral Q breakfast   insulin  aspart  0-5 Units Subcutaneous QHS   insulin  aspart  0-6 Units Subcutaneous TID WC   insulin  glargine-yfgn  10 Units Subcutaneous QHS   levothyroxine   150 mcg Oral QAC  breakfast   pantoprazole   40 mg Oral BID   potassium chloride   40 mEq Oral Once   sodium chloride  flush  10-40 mL Intracatheter Q12H    acetaminophen  **OR** acetaminophen , diphenhydrAMINE, HYDROmorphone  (DILAUDID ) injection, oxyCODONE -acetaminophen , senna-docusate, trimethobenzamide   Objective:   Vitals:   10/22/23 0945 10/22/23 0951 10/22/23 0952 10/22/23 1017  BP: (!) 130/57 136/60    Pulse: 90 90 90 88  Resp: 17 17 20    Temp:   98.9 F (37.2 C) 97.6 F (36.4 C)  TempSrc:    Axillary  SpO2: 95% 95% 94% 97%  Weight:      Height:        Intake/Output Summary (Last 24 hours) at 10/22/2023 1130 Last data filed at 10/22/2023 9047 Gross per 24 hour  Intake 1204.99 ml  Output 400 ml  Net 804.99 ml   Filed Weights   10/20/23 1956 10/22/23 0644  Weight: 100.6 kg 100.6 kg     Physical examination:   Constitution:  Alert, cooperative, no distress,  Appears calm and comfortable  Psychiatric:   Normal and stable mood and affect, cognition intact,   HEENT:        Normocephalic, PERRL, otherwise with in Normal limits  Chest:         Chest symmetric Cardio vascular:  S1/S2, RRR, No murmure, No Rubs or Gallops  pulmonary: Clear to auscultation bilaterally, respirations unlabored, negative wheezes / crackles Abdomen: Soft, non-tender, non-distended, bowel sounds,no masses, no  organomegaly Muscular skeletal: Limited exam - in bed, able to move all 4 extremities,   Neuro: CNII-XII intact. , normal motor and sensation, reflexes intact  Extremities: Right lower extremity stump-clean no signs of infection related Left lower extremity amputated metatarsal-stump with multiple discoloration purple, no open patient with drainage multiple bullae formation and large bullae around the ankle area (please see pictures No pitting edema lower extremities,  ? pulses   Skin: Dry, warm to touch, negative for any Rashes,  No open wounds-Erythema and lesion presen  Wounds: per nursing documentation       D  LABs:     Latest Ref Rng & Units 10/22/2023    2:18 AM 10/21/2023    7:30 AM 10/20/2023   10:00 PM  CBC  WBC 4.0 - 10.5 K/uL 11.4  13.1  10.4   Hemoglobin 12.0 - 15.0 g/dL 8.3  89.9  7.4   Hematocrit 36.0 - 46.0 % 24.9  30.1  22.6   Platelets 150 - 400 K/uL 229  238  209       Latest Ref Rng & Units 10/22/2023    2:18 AM 10/21/2023   12:30 PM 10/20/2023   10:00 PM  CMP  Glucose 70 - 99 mg/dL 846  829  832   BUN 6 - 20 mg/dL 19  19  26    Creatinine 0.44 - 1.00 mg/dL 8.84  8.74  8.65   Sodium 135 - 145 mmol/L 135  134  132   Potassium 3.5 - 5.1 mmol/L 3.5  4.0  3.2   Chloride 98 - 111 mmol/L 107  104  103   CO2 22 - 32 mmol/L 20  21  19    Calcium  8.9 - 10.3 mg/dL 7.5  7.5  7.2   Total Protein 6.5 - 8.1 g/dL 5.0   5.0   Total Bilirubin 0.0 - 1.2 mg/dL 0.8   0.6   Alkaline Phos 38 - 126 U/L 170   143   AST 15 - 41 U/L 59  81   ALT 0 - 44 U/L 37   42        Micro Results Recent Results (from the past 240 hours)  Culture, blood (Routine X 2) w Reflex to ID Panel     Status: None (Preliminary result)   Collection Time: 10/20/23 10:00 PM   Specimen: BLOOD LEFT HAND  Result Value Ref Range Status   Specimen Description BLOOD LEFT HAND  Final   Special Requests   Final    BOTTLES DRAWN AEROBIC AND ANAEROBIC Blood Culture adequate volume   Culture   Final     NO GROWTH 2 DAYS Performed at [facility], [address] [place], [place] [address]    Report Status PENDING  Incomplete  Culture, blood (Routine X 2) w Reflex to ID Panel     Status: None (Preliminary result)   Collection Time: 10/20/23 10:02 PM   Specimen: BLOOD LEFT ARM  Result Value Ref Range Status   Specimen Description BLOOD LEFT ARM  Final   Special Requests   Final    BOTTLES DRAWN AEROBIC AND ANAEROBIC Blood Culture adequate volume   Culture   Final    NO GROWTH 2 DAYS Performed at [facility], [address], [place], [place] [address]    Report Status PENDING  Incomplete  Surgical pcr screen     Status: None   Collection Time: 10/21/23  9:19 PM   Specimen: Nasal Mucosa; Nasal Swab  Result Value Ref Range Status   MRSA, PCR NEGATIVE NEGATIVE Final   Staphylococcus aureus NEGATIVE NEGATIVE Final    Comment: (NOTE) The Xpert SA Assay (FDA approved for NASAL specimens in patients 26 years of age and older), is one component of a comprehensive surveillance program. It is not intended to diagnose infection nor to guide or monitor treatment. Performed at [facility], [address] [place], [place] [address]     Radiology Reports ECHOCARDIOGRAM COMPLETE Result Date: 10/21/2023    ECHOCARDIOGRAM REPORT   Patient Name:   OZELLA COMINS [facility] [place] Date of Exam: 10/21/2023 Medical Rec #:  [id number]              Height:       64.0 in Accession #:    [id number]             Weight:       221.8 lb Date of Birth:  31-Oct-1965               BSA:          2.044 m Patient Age:    57 years               BP:           125/63 mmHg Patient Gender: F                      HR:           87 bpm. Exam Location:  Inpatient Procedure: 2D Echo, Cardiac Doppler and Color Doppler (Both Spectral and Color            Flow Doppler were utilized during procedure). Indications:    Elevated troponin  History:        Patient has prior history of Echocardiogram examinations, most                  recent 04/04/2023.  Sonographer:    Tinnie Gosling RDCS Referring Phys:  [id number] SUBRINA SUNDIL IMPRESSIONS  1. Left ventricular ejection fraction, by estimation, is 50 to 55%. The left ventricle has low normal function. The left ventricle has no regional wall motion abnormalities. Left ventricular diastolic parameters are consistent with Grade II diastolic dysfunction (pseudonormalization).  2. Right ventricular systolic function was not well visualized. The right ventricular size is not well visualized.  3. The mitral valve is degenerative. Moderate mitral valve regurgitation. No evidence of mitral stenosis.  4. The aortic valve is normal in structure. There is severe calcifcation of the aortic valve. There is severe thickening of the aortic valve. Aortic valve regurgitation is mild. Mild to moderate aortic valve stenosis. Aortic valve mean gradient measures  17.0 mmHg. Aortic valve Vmax measures 2.66 m/s.  5. The inferior vena cava is normal in size with greater than 50% respiratory variability, suggesting right atrial pressure of 3 mmHg. Conclusion(s)/Recommendation(s): Extremely limited study with poor visualization. If endocarditis is suspected a TEE would be beneficial. FINDINGS  Left Ventricle: Left ventricular ejection fraction, by estimation, is 50 to 55%. The left ventricle has low normal function. The left ventricle has no regional wall motion abnormalities. The left ventricular internal cavity size was normal in size. There is no left ventricular hypertrophy. Left ventricular diastolic parameters are consistent with Grade II diastolic dysfunction (pseudonormalization).  LV Wall Scoring: Inferolateral wall hypokinesis. Right Ventricle: The right ventricular size is not well visualized. Right vetricular wall thickness was not well visualized. Right ventricular systolic function was not well visualized. Left Atrium: Left atrial size was normal in size. Right Atrium: Right atrial size was normal in size.  Pericardium: There is no evidence of pericardial effusion. Mitral Valve: The mitral valve is degenerative in appearance. Mild mitral annular calcification. Moderate mitral valve regurgitation. No evidence of mitral valve stenosis. Tricuspid Valve: The tricuspid valve is normal in structure. Tricuspid valve regurgitation is not demonstrated. No evidence of tricuspid stenosis. Aortic Valve: The aortic valve is normal in structure. There is severe calcifcation of the aortic valve. There is severe thickening of the aortic valve. Aortic valve regurgitation is mild. Mild to moderate aortic stenosis is present. Aortic valve mean gradient measures 17.0 mmHg. Aortic valve peak gradient measures 28.3 mmHg. Pulmonic Valve: The pulmonic valve was normal in structure. Pulmonic valve regurgitation is not visualized. No evidence of pulmonic stenosis. Aorta: The aortic root is normal in size and structure. Venous: The inferior vena cava is normal in size with greater than 50% respiratory variability, suggesting right atrial pressure of 3 mmHg. IAS/Shunts: No atrial level shunt detected by color flow Doppler.  LEFT VENTRICLE PLAX 2D LVIDd:         5.30 cm   Diastology LVIDs:         4.20 cm   LV e' medial:    6.20 cm/s LV PW:         1.10 cm   LV E/e' medial:  15.9 LV IVS:        1.10 cm   LV e' lateral:   7.94 cm/s LVOT diam:     2.00 cm   LV E/e' lateral: 12.4 LVOT Area:     3.14 cm  LEFT ATRIUM         Index LA diam:    3.90 cm 1.91 cm/m  AORTIC VALVE AV Vmax:      266.00 cm/s AV Vmean:     182.000 cm/s AV VTI:       0.489 m AV Peak Grad: 28.3 mmHg AV Mean  Grad: 17.0 mmHg  AORTA Ao Root diam: 2.40 cm Ao Asc diam:  3.30 cm MITRAL VALVE MV Area (PHT): 3.40 cm     SHUNTS MV Decel Time: 223 msec     Systemic Diam: 2.00 cm MV E velocity: 98.50 cm/s MV A velocity: 103.00 cm/s MV E/A ratio:  0.96 Morene Brownie Electronically signed by Morene Brownie Signature Date/Time: 10/21/2023/8:46:33 PM    Final     SIGNED: Adriana DELENA Grams, MD, FHM. FAAFP. [facility] Time spent - 55 min.  In seeing, evaluating and examining the patient. Reviewing medical records, labs, drawn plan of care. [facility],  Pager (please use [email or web address] to page/ text) Please use Epic Secure Chat for non-urgent communication (7AM-7PM)  If 7PM-7AM, please contact night-coverage [email or web address], 10/22/2023, 11:30 AM

## 2023-10-22 NOTE — Anesthesia Procedure Notes (Signed)
 Procedure Name: LMA Insertion Date/Time: 10/22/2023 7:48 AM  Performed by: Laverda Burnard LABOR, CRNAPre-anesthesia Checklist: Patient identified, Emergency Drugs available, Suction available and Patient being monitored Patient Re-evaluated:Patient Re-evaluated prior to induction Oxygen  Delivery Method: Circle system utilized Preoxygenation: Pre-oxygenation with 100% oxygen  Induction Type: IV induction Ventilation: Mask ventilation without difficulty LMA: LMA inserted LMA Size: 4.0 Number of attempts: 1 Placement Confirmation: positive ETCO2 and breath sounds checked- equal and bilateral Tube secured with: Tape Dental Injury: Teeth and Oropharynx as per pre-operative assessment

## 2023-10-22 NOTE — Progress Notes (Signed)
  Daily Progress Note   Subjective: No pain, no chills, no complaints  Objective: Vitals:   10/22/23 0613 10/22/23 0644  BP: 139/62 (!) 136/57  Pulse: 84 80  Resp: 18 16  Temp: 98.5 F (36.9 C) 98.7 F (37.1 C)  SpO2: 96% 98%    Physical Examination Left leg with mottling at the foot  No ascending cellulitis  ASSESSMENT/PLAN:  Plan for open BKA today. Discussed with Dr. Harden plan for RTOR in the coming days for closure.  Pt aware this may not heal   Fonda FORBES Rim MD MS [facility] [phone] 10/22/2023  7:30 AM

## 2023-10-23 ENCOUNTER — Inpatient Hospital Stay (HOSPITAL_COMMUNITY): Payer: Self-pay | Admitting: Registered Nurse

## 2023-10-23 ENCOUNTER — Encounter (HOSPITAL_COMMUNITY): Payer: Self-pay | Admitting: Internal Medicine

## 2023-10-23 ENCOUNTER — Other Ambulatory Visit: Payer: Self-pay

## 2023-10-23 ENCOUNTER — Encounter (HOSPITAL_COMMUNITY)
Admission: EM | Disposition: A | Discharge status: Rehabilitation Facility | Payer: Self-pay | Source: Other Acute Inpatient Hospital | Attending: Internal Medicine

## 2023-10-23 DIAGNOSIS — I96 Gangrene, not elsewhere classified: Secondary | ICD-10-CM | POA: Diagnosis not present

## 2023-10-23 DIAGNOSIS — B9562 Methicillin resistant Staphylococcus aureus infection as the cause of diseases classified elsewhere: Secondary | ICD-10-CM

## 2023-10-23 DIAGNOSIS — I110 Hypertensive heart disease with heart failure: Secondary | ICD-10-CM

## 2023-10-23 DIAGNOSIS — R7881 Bacteremia: Secondary | ICD-10-CM

## 2023-10-23 DIAGNOSIS — M726 Necrotizing fasciitis: Secondary | ICD-10-CM

## 2023-10-23 DIAGNOSIS — I5043 Acute on chronic combined systolic (congestive) and diastolic (congestive) heart failure: Secondary | ICD-10-CM

## 2023-10-23 DIAGNOSIS — I2510 Atherosclerotic heart disease of native coronary artery without angina pectoris: Secondary | ICD-10-CM

## 2023-10-23 HISTORY — PX: REVISION AMPUTATION, BELOW THE KNEE: SHX7423

## 2023-10-23 LAB — CBC
HCT: 24.8 % — ABNORMAL LOW (ref 36.0–46.0)
Hemoglobin: 7.9 g/dL — ABNORMAL LOW (ref 12.0–15.0)
MCH: 28.8 pg (ref 26.0–34.0)
MCHC: 31.9 g/dL (ref 30.0–36.0)
MCV: 90.5 fL (ref 80.0–100.0)
Platelets: 260 10*3/uL (ref 150–400)
RBC: 2.74 MIL/uL — ABNORMAL LOW (ref 3.87–5.11)
RDW: 15.1 % (ref 11.5–15.5)
WBC: 9.5 10*3/uL (ref 4.0–10.5)
nRBC: 0 % (ref 0.0–0.2)

## 2023-10-23 LAB — COMPREHENSIVE METABOLIC PANEL WITH GFR
ALT: 28 U/L (ref 0–44)
AST: 35 U/L (ref 15–41)
Albumin: <1.5 g/dL — ABNORMAL LOW (ref 3.5–5.0)
Alkaline Phosphatase: 171 U/L — ABNORMAL HIGH (ref 38–126)
Anion gap: 4 — ABNORMAL LOW (ref 5–15)
BUN: 19 mg/dL (ref 6–20)
CO2: 22 mmol/L (ref 22–32)
Calcium: 7.3 mg/dL — ABNORMAL LOW (ref 8.9–10.3)
Chloride: 109 mmol/L (ref 98–111)
Creatinine, Ser: 1.07 mg/dL — ABNORMAL HIGH (ref 0.44–1.00)
GFR, Estimated: >60 mL/min (ref >60)
Glucose, Bld: 187 mg/dL — ABNORMAL HIGH (ref 70–99)
Potassium: 4.1 mmol/L (ref 3.5–5.1)
Sodium: 135 mmol/L (ref 135–145)
Total Bilirubin: 0.7 mg/dL (ref 0.0–1.2)
Total Protein: 5.2 g/dL — ABNORMAL LOW (ref 6.5–8.1)

## 2023-10-23 LAB — GLUCOSE, CAPILLARY
Glucose-Capillary: 166 mg/dL — ABNORMAL HIGH (ref 70–99)
Glucose-Capillary: 149 mg/dL — ABNORMAL HIGH (ref 70–99)
Glucose-Capillary: 129 mg/dL — ABNORMAL HIGH (ref 70–99)
Glucose-Capillary: 114 mg/dL — ABNORMAL HIGH (ref 70–99)
Glucose-Capillary: 128 mg/dL — ABNORMAL HIGH (ref 70–99)
Glucose-Capillary: 130 mg/dL — ABNORMAL HIGH (ref 70–99)

## 2023-10-23 LAB — SURGICAL PATHOLOGY

## 2023-10-23 SURGERY — REVISION AMPUTATION, BELOW THE KNEE
Anesthesia: Monitor Anesthesia Care | Laterality: Left

## 2023-10-23 MED ORDER — ORAL CARE MOUTH RINSE: 15.0000 mL | Freq: Once | OROMUCOSAL | Status: AC

## 2023-10-23 MED ORDER — LIDOCAINE 2% (20 MG/ML) 5 ML SYRINGE
INTRAMUSCULAR | Status: AC
  Filled 2023-10-23: qty 5

## 2023-10-23 MED ORDER — FENTANYL CITRATE (PF) 100 MCG/2ML IJ SOLN
INTRAMUSCULAR | Status: AC
  Administered 2023-10-23: 100 ug via INTRAVENOUS
  Filled 2023-10-23: qty 2

## 2023-10-23 MED ORDER — FENTANYL CITRATE (PF) 250 MCG/5ML IJ SOLN
INTRAMUSCULAR | Status: DC | PRN
  Administered 2023-10-23: 50 ug via INTRAVENOUS

## 2023-10-23 MED ORDER — MIDAZOLAM HCL 2 MG/2ML IJ SOLN
2.0000 mg | Freq: Once | INTRAMUSCULAR | Status: AC
Start: 2023-10-23 — End: 2023-10-23

## 2023-10-23 MED ORDER — MIDAZOLAM HCL 2 MG/2ML IJ SOLN
INTRAMUSCULAR | Status: AC
  Administered 2023-10-23: 2 mg via INTRAVENOUS
  Filled 2023-10-23: qty 2

## 2023-10-23 MED ORDER — SODIUM CHLORIDE 0.9% FLUSH: 3.0000 mL | INTRAVENOUS | Status: DC | PRN

## 2023-10-23 MED ORDER — ROCURONIUM BROMIDE 10 MG/ML (PF) SYRINGE
PREFILLED_SYRINGE | INTRAVENOUS | Status: AC
  Filled 2023-10-23: qty 10

## 2023-10-23 MED ORDER — PROPOFOL 10 MG/ML IV BOLUS
INTRAVENOUS | Status: DC | PRN
  Administered 2023-10-23: 25 ug/kg/min via INTRAVENOUS
  Administered 2023-10-23: 40 mg via INTRAVENOUS

## 2023-10-23 MED ORDER — VANCOMYCIN HCL 1000 MG IV SOLR
INTRAVENOUS | Status: AC
  Filled 2023-10-23: qty 20

## 2023-10-23 MED ORDER — PHENOL 1.4 % MT LIQD: 1.0000 | OROMUCOSAL | Status: DC | PRN

## 2023-10-23 MED ORDER — DOCUSATE SODIUM 100 MG PO CAPS
100.0000 mg | ORAL_CAPSULE | Freq: Every day | ORAL | Status: DC
  Administered 2023-10-24 – 2023-10-27 (×4): 100 mg via ORAL
  Filled 2023-10-23 (×4): qty 1

## 2023-10-23 MED ORDER — ONDANSETRON HCL 4 MG/2ML IJ SOLN
INTRAMUSCULAR | Status: AC
  Filled 2023-10-23: qty 2

## 2023-10-23 MED ORDER — PROPOFOL 10 MG/ML IV BOLUS
INTRAVENOUS | Status: AC
  Filled 2023-10-23: qty 20

## 2023-10-23 MED ORDER — SODIUM CHLORIDE 0.9% FLUSH
3.0000 mL | Freq: Two times a day (BID) | INTRAVENOUS | Status: DC
  Administered 2023-10-23 – 2023-10-24 (×2): 3 mL via INTRAVENOUS

## 2023-10-23 MED ORDER — HYDRALAZINE HCL 20 MG/ML IJ SOLN: 5.0000 mg | INTRAMUSCULAR | Status: DC | PRN

## 2023-10-23 MED ORDER — VANCOMYCIN HCL 1000 MG IV SOLR
INTRAVENOUS | Status: DC | PRN
Start: 2023-10-23 — End: 2023-10-23
  Administered 2023-10-23: 1000 mg via TOPICAL

## 2023-10-23 MED ORDER — INSULIN ASPART 100 UNIT/ML IJ SOLN
0.0000 [IU] | INTRAMUSCULAR | Status: DC | PRN
  Administered 2023-10-23: 2 [IU] via SUBCUTANEOUS
  Filled 2023-10-23: qty 1

## 2023-10-23 MED ORDER — METOPROLOL TARTRATE 5 MG/5ML IV SOLN: 2.0000 mg | INTRAVENOUS | Status: DC | PRN

## 2023-10-23 MED ORDER — ALUM & MAG HYDROXIDE-SIMETH 200-200-20 MG/5ML PO SUSP: 15.0000 mL | ORAL | Status: DC | PRN

## 2023-10-23 MED ORDER — PHENYLEPHRINE 80 MCG/ML (10ML) SYRINGE FOR IV PUSH (FOR BLOOD PRESSURE SUPPORT)
PREFILLED_SYRINGE | INTRAVENOUS | Status: DC | PRN
  Administered 2023-10-23 (×2): 80 ug via INTRAVENOUS

## 2023-10-23 MED ORDER — ONDANSETRON HCL 4 MG/2ML IJ SOLN
INTRAMUSCULAR | Status: DC | PRN
  Administered 2023-10-23: 4 mg via INTRAVENOUS

## 2023-10-23 MED ORDER — VASHE WOUND IRRIGATION OPTIME
TOPICAL | Status: DC | PRN
  Administered 2023-10-23: 34 [oz_av]

## 2023-10-23 MED ORDER — FENTANYL CITRATE (PF) 250 MCG/5ML IJ SOLN
INTRAMUSCULAR | Status: AC
  Filled 2023-10-23: qty 5

## 2023-10-23 MED ORDER — MIDAZOLAM HCL 2 MG/2ML IJ SOLN
INTRAMUSCULAR | Status: DC | PRN
  Administered 2023-10-23: 1 mg via INTRAVENOUS

## 2023-10-23 MED ORDER — 0.9 % SODIUM CHLORIDE (POUR BTL) OPTIME
TOPICAL | Status: DC | PRN
  Administered 2023-10-23: 1000 mL

## 2023-10-23 MED ORDER — MIDAZOLAM HCL 2 MG/2ML IJ SOLN
INTRAMUSCULAR | Status: AC
  Filled 2023-10-23: qty 2

## 2023-10-23 MED ORDER — CHLORHEXIDINE GLUCONATE 0.12 % MT SOLN
OROMUCOSAL | Status: AC
  Administered 2023-10-23: 15 mL via OROMUCOSAL
  Filled 2023-10-23: qty 15

## 2023-10-23 MED ORDER — POTASSIUM CHLORIDE CRYS ER 20 MEQ PO TBCR: 20.0000 meq | EXTENDED_RELEASE_TABLET | Freq: Every day | ORAL | Status: DC | PRN

## 2023-10-23 MED ORDER — ONDANSETRON HCL 4 MG/2ML IJ SOLN: 4.0000 mg | Freq: Four times a day (QID) | INTRAMUSCULAR | Status: DC | PRN

## 2023-10-23 MED ORDER — FENTANYL CITRATE (PF) 100 MCG/2ML IJ SOLN: 100.0000 ug | Freq: Once | INTRAMUSCULAR | Status: AC

## 2023-10-23 MED ORDER — DAPTOMYCIN-SODIUM CHLORIDE 700-0.9 MG/100ML-% IV SOLN
700.0000 mg | Freq: Every day | INTRAVENOUS | Status: DC
  Administered 2023-10-23 – 2023-10-28 (×6): 700 mg via INTRAVENOUS
  Filled 2023-10-23 (×7): qty 100

## 2023-10-23 MED ORDER — ACETAMINOPHEN 500 MG PO TABS
1000.0000 mg | ORAL_TABLET | Freq: Once | ORAL | Status: AC
  Administered 2023-10-23: 1000 mg via ORAL
  Filled 2023-10-23: qty 2

## 2023-10-23 MED ORDER — CHLORHEXIDINE GLUCONATE 0.12 % MT SOLN: 15.0000 mL | Freq: Once | OROMUCOSAL | Status: AC

## 2023-10-23 MED ORDER — GUAIFENESIN-DM 100-10 MG/5ML PO SYRP
15.0000 mL | ORAL_SOLUTION | ORAL | Status: DC | PRN
Start: 2023-10-23 — End: 2023-10-29

## 2023-10-23 MED ORDER — ENOXAPARIN SODIUM 60 MG/0.6ML IJ SOSY
50.0000 mg | PREFILLED_SYRINGE | Freq: Every day | INTRAMUSCULAR | Status: DC
  Administered 2023-10-24 – 2023-10-29 (×6): 50 mg via SUBCUTANEOUS
  Filled 2023-10-23 (×6): qty 0.6

## 2023-10-23 MED ORDER — PHENYLEPHRINE 80 MCG/ML (10ML) SYRINGE FOR IV PUSH (FOR BLOOD PRESSURE SUPPORT)
PREFILLED_SYRINGE | INTRAVENOUS | Status: AC
  Filled 2023-10-23: qty 10

## 2023-10-23 MED ORDER — LACTATED RINGERS IV SOLN: INTRAVENOUS | Status: DC

## 2023-10-23 MED ORDER — LABETALOL HCL 5 MG/ML IV SOLN: 10.0000 mg | INTRAVENOUS | Status: DC | PRN

## 2023-10-23 MED ORDER — MAGNESIUM SULFATE 2 GM/50ML IV SOLN: 2.0000 g | Freq: Every day | INTRAVENOUS | Status: DC | PRN

## 2023-10-23 MED ORDER — ROPIVACAINE HCL 5 MG/ML IJ SOLN
INTRAMUSCULAR | Status: DC | PRN
  Administered 2023-10-23: 30 mL via PERINEURAL
  Administered 2023-10-23: 15 mL via PERINEURAL

## 2023-10-23 SURGICAL SUPPLY — 34 items
BAG COUNTER SPONGE SURGICOUNT (BAG) IMPLANT
BLADE SAW RECIP 87.9 MT (BLADE) ×2 IMPLANT
BLADE SURG 21 STRL SS (BLADE) ×2 IMPLANT
BNDG COHESIVE 6X5 TAN ST LF (GAUZE/BANDAGES/DRESSINGS) IMPLANT
CANISTER WOUND CARE 500ML ATS (WOUND CARE) ×2 IMPLANT
CANISTER WOUNDNEG PRESSURE 500 (CANNISTER) IMPLANT
COVER SURGICAL LIGHT HANDLE (MISCELLANEOUS) ×2 IMPLANT
CUFF TRNQT CYL 34X4.125X (TOURNIQUET CUFF) ×2 IMPLANT
DRAPE DERMATAC (DRAPES) IMPLANT
DRAPE INCISE IOBAN 66X45 STRL (DRAPES) ×2 IMPLANT
DRAPE U-SHAPE 47X51 STRL (DRAPES) ×2 IMPLANT
DRESSING PREVENA PLUS CUSTOM (GAUZE/BANDAGES/DRESSINGS) ×2 IMPLANT
DURAPREP 26ML APPLICATOR (WOUND CARE) ×2 IMPLANT
ELECTRODE REM PT RTRN 9FT ADLT (ELECTROSURGICAL) ×2 IMPLANT
GLOVE BIOGEL PI IND STRL 9 (GLOVE) ×2 IMPLANT
GLOVE SURG ORTHO 9.0 STRL STRW (GLOVE) ×2 IMPLANT
GOWN STRL REUS W/ TWL XL LVL3 (GOWN DISPOSABLE) ×4 IMPLANT
GRAFT SKIN WND SURGICLOSE M95 (Tissue) IMPLANT
KIT BASIN OR (CUSTOM PROCEDURE TRAY) ×2 IMPLANT
KIT TURNOVER KIT B (KITS) ×2 IMPLANT
MANIFOLD NEPTUNE II (INSTRUMENTS) ×2 IMPLANT
NS IRRIG 1000ML POUR BTL (IV SOLUTION) ×2 IMPLANT
PACK ORTHO EXTREMITY (CUSTOM PROCEDURE TRAY) ×2 IMPLANT
PAD ARMBOARD POSITIONER FOAM (MISCELLANEOUS) ×2 IMPLANT
PREVENA RESTOR ARTHOFORM 46X30 (CANNISTER) ×2 IMPLANT
SPONGE T-LAP 18X18 ~~LOC~~+RFID (SPONGE) IMPLANT
STAPLER SKIN PROX 35W (STAPLE) IMPLANT
STOCKINETTE IMPERVIOUS LG (DRAPES) ×2 IMPLANT
SUT ETHILON 2 0 PSLX (SUTURE) IMPLANT
SUT SILK 2-0 18XBRD TIE 12 (SUTURE) ×2 IMPLANT
SUT VIC AB 1 CTX 27 (SUTURE) ×4 IMPLANT
TOWEL GREEN STERILE (TOWEL DISPOSABLE) ×2 IMPLANT
TUBE CONNECTING 12X1/4 (SUCTIONS) ×2 IMPLANT
YANKAUER SUCT BULB TIP NO VENT (SUCTIONS) ×2 IMPLANT

## 2023-10-23 NOTE — Op Note (Signed)
 10/23/2023  12:13 PM  PATIENT:  Kayla Schmitt    PRE-OPERATIVE DIAGNOSIS:  Necrotizing Fasciitis Left Leg  POST-OPERATIVE DIAGNOSIS:  Same  PROCEDURE:  REVISION AMPUTATION, BELOW THE KNEE Excision skin soft tissue muscle and fascia sent for cultures. Application Kerecis micro graft 95 cm to cover wound surface area greater than 200 cm. Application of customizable wound VAC and arthroform wound VAC sponges.  SURGEON:  Jerona LULLA Sage, MD  PHYSICIAN ASSISTANT: April Green ANESTHESIA:   General  PREOPERATIVE INDICATIONS:  Kayla Schmitt is a  58 y.o. female with a diagnosis of Necrotizing Fasciitis Left Leg who failed conservative measures and elected for surgical management.    The risks benefits and alternatives were discussed with the patient preoperatively including but not limited to the risks of infection, bleeding, nerve injury, cardiopulmonary complications, the need for revision surgery, among others, and the patient was willing to proceed.  OPERATIVE IMPLANTS:   Implant Name Type Inv. Item Serial No. Manufacturer Lot No. LRB No. Used Action  GRAFT SKIN WND SURGICLOSE M95 - [id number] Tissue GRAFT SKIN WND SURGICLOSE M95  [employer] [id number] Left 1 Implanted    @ENCIMAGES @  OPERATIVE FINDINGS: Patient had necrotic muscle fascia and skin.  These were resected back to bleeding viable tissue margins.  Tissue sent for cultures.  OPERATIVE PROCEDURE: Patient brought the operating room and underwent a general anesthetic.  After adequate levels anesthesia were obtained patient's left lower extremity was prepped using DuraPrep draped into a sterile field a timeout was called.  Patient has developed full-thickness skin ischemic changes on the distal flap.  The distal 2 cm of the flap was resected.  There was necrotic fascia that was excised and sent for cultures.  Necrotic muscle from all compartments was also excised.  The distal 2 cm of the tibia was resected  beveled anteriorly and the distal 2 cm of the fibula was also resected.  After resection back to bleeding viable margins the wound was irrigated with Vashe electrocardio was used hemostasis.  The wound bed of 200 cm was filled with 95 cm of Kerecis micro graft.  The deep and superficial fascial layers were closed using #2-0 nylon.  A customizable and Rome form wound VAC was applied this had a good suction fit this was covered with a sock and the limb protector.  Patient was taken the PACU in stable condition.   DISCHARGE PLANNING:  Antibiotic duration: Continue antibiotics adjust according to culture sensitivities  Weightbearing: Nonweightbearing on the left  Pain medication: Continue opioid pathway  Dressing care/ Wound VAC: Wound VAC  Ambulatory devices: Walker  Discharge to: Anticipate discharge to inpatient versus outpatient rehab.  Follow-up: In the office 1 week post operative.

## 2023-10-23 NOTE — Transfer of Care (Signed)
 Immediate Anesthesia Transfer of Care Note  Patient: Kayla Schmitt  Procedure(s) Performed: REVISION AMPUTATION, BELOW THE KNEE (Left)  Patient Location: PACU  Anesthesia Type:MAC  Level of Consciousness: awake, alert , and oriented  Airway & Oxygen  Therapy: Patient Spontanous Breathing  Post-op Assessment: Report given to RN and Post -op Vital signs reviewed and stable  Post vital signs: Reviewed and stable  Last Vitals:  Vitals Value Taken Time  BP 105/59 10/23/23 12:12  Temp    Pulse 66 10/23/23 12:14  Resp 11 10/23/23 12:14  SpO2 100 % 10/23/23 12:14  Vitals shown include unfiled device data.  Last Pain:  Vitals:   10/23/23 0954  TempSrc:   PainSc: 0-No pain         Complications: There were no known notable events for this encounter.

## 2023-10-23 NOTE — Progress Notes (Signed)
 Orthopedic Tech Progress Note Patient Details:  Kayla Schmitt 01/01/1966 [id number]  PT has VIVE PROTOCOL   Patient ID: [name], female   DOB: November 22, 1965, 58 y.o.   MRN: [id number]  Delanna LITTIE Pac 10/23/2023, 12:08 PM

## 2023-10-23 NOTE — Progress Notes (Signed)
 PROGRESS NOTE   Kayla Schmitt  FMW:[id number]    DOB: Dec 06, 1965    DOA: 10/20/2023  PCP: Kayla Jacquline NOVAK, NP   I have briefly reviewed patients previous medical records in [facility].   Brief Hospital Course:  58 y.o. 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  Sepsis protocol was initiated and she was started on vancomycin  and cefepime .  Blood cultures grew gram-positive cocci but no clear source of infection.  CT of chest abdomen and pelvis without clear abnormality.  Venous Doppler negative for DVT.  She was noted to have purple spotty area of discoloration of left leg.  She was then transferred to [facility] for vascular surgery consult. At [facility] patient has been treated for sepsis with endorgan dysfunction including elevated lactic acid and AKI, peripheral vascular disease with increased pain of the left lower extremity, AKI, altered mental status, DM type II and elevated troponin secondary to demand ischemia. Patient also received 1 unit of blood transfusion as hemoglobin dropped 8.2-7. No GI bleed at outside hospital.    Assessment & Plan:   PAD bilateral lower extremities, necrotizing fasciitis of left lower extremity Vascular surgery and orthopedics were consulted She has history of bilateral transmetatarsal amputations and previous bilateral lower extremity endovascular revascularization. S/p left BKA with wound VAC placement on 6/24 by Dr. Fonda Rim S/p revision amputation, below the knee on 6/25 by Dr. Harden  MRSA bacteremia Surgical intervention on left lower extremity as noted above. ID managing antimicrobial treatment Vancomycin   switched to daptomycin. Plan for TEE 6/26. Surveillance blood cultures x 2 from 6/22, negative to date.  Acute kidney injury Suspecting prerenal in the setting of sepsis and hypotension Resolved.  Avoid nephrotoxics and hypotension.  Elevated troponin secondary to demand ischemia/history of CAD Outside facility troponin, 696 > 576 > 532.  No anginal symptoms or acute EKG changes. TTE: LVEF 50-55%, grade 2 diastolic dysfunction. Was on IV Dieulafoy heparin  drip for PAD indication which was discontinued on 6/24. Continue atorvastatin  and Plavix .  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 Hemoglobin has gradually drifted down to 7.9, likely related to multiple surgeries Patient received a unit of PRBC at outside hospital for hemoglobin of 7. Follow CBCs daily and transfuse for hemoglobin 7 g or less  Insulin -dependent type 2 DM Good inpatient control on current dose of Semglee  10 units at bedtime and NovoLog  SSI  Chronic combined systolic and diastolic CHF EF appears to have improved compared to prior.  Clinically euvolemic.  Hypertension Intermittent soft blood pressures Monitoring off of antihypertensives  Prolonged QT Minimize QT prolonging drugs Last EKG on 6/22 showed QTc of 486 ms.  Hypothyroidism Continue with levothyroxine .  Body mass index is 38.07 kg/m.   DVT prophylaxis: SCD's Start: 10/23/23 1248 SCDs Start: 10/20/23 2037     Code Status: Full Code:  Family Communication: Daughter at bedside Disposition:  Status is: Inpatient Remains inpatient appropriate because: IV antibiotics, TEE planned for tomorrow, immediate postop.     Consultants:   Vascular surgery Orthopedics Infectious disease  Procedures:   As above  Subjective:  Patient was in OR this morning.  Saw her late this  afternoon.  Denies complaints.  Specifically denies pain.  States that she is  going for TEE tomorrow.  Objective:   Vitals:   10/23/23 1024 10/23/23 1215 10/23/23 1230 10/23/23 1247  BP: (!) 107/59 (!) 105/59 (!) 127/54 125/60  Pulse: 66 64 69 66  Resp: (!) 7 15 12 16   Temp:  97.8 F (36.6 C) 97.8 F (36.6 C)   TempSrc:      SpO2: 98% 100% 99% 100%  Weight:      Height:        General exam: Young female, moderately built and nourished lying comfortably propped up in bed without distress. Respiratory system: Clear to auscultation. Respiratory effort normal. Cardiovascular system: S1 & S2 heard, RRR. No JVD, murmurs, rubs, gallops or clicks. No right lower extremity pedal edema.  Telemetry personally reviewed: Sinus rhythm. Gastrointestinal system: Abdomen is nondistended, soft and nontender. No organomegaly or masses felt. Normal bowel sounds heard. Central nervous system: Alert and oriented. No focal neurological deficits. Extremities: Symmetric 5 x 5 power.  Healed right transmetatarsal amputation stump.  Left BKA stump site with wound VAC and postop dressing clean and dry. Skin: No rashes, lesions or ulcers Psychiatry: Judgement and insight appear normal. Mood & affect appropriate.     Data Reviewed:   I have personally reviewed following labs and imaging studies   CBC: Recent Labs  Lab 10/21/23 0730 10/22/23 0218 10/23/23 0435  WBC 13.1* 11.4* 9.5  HGB 10.0* 8.3* 7.9*  HCT 30.1* 24.9* 24.8*  MCV 88.8 87.1 90.5  PLT 238 229 260    Basic Metabolic Panel: Recent Labs  Lab 10/20/23 2200 10/21/23 1230 10/22/23 0218 10/23/23 0435  NA 132* 134* 135 135  K 3.2* 4.0 3.5 4.1  CL 103 104 107 109  CO2 19* 21* 20* 22  GLUCOSE 167* 170* 153* 187*  BUN 26* 19 19 19   CREATININE 1.34* 1.25* 1.15* 1.07*  CALCIUM  7.2* 7.5* 7.5* 7.3*  MG  --  1.9  --   --     Liver Function Tests: Recent Labs  Lab 10/20/23 2200 10/22/23 0218 10/23/23 0435  AST 81* 59* 35  ALT 42 37 28  ALKPHOS 143* 170* 171*  BILITOT 0.6 0.8 0.7  PROT 5.0* 5.0* 5.2*   ALBUMIN <1.5* <1.5* <1.5*    CBG: Recent Labs  Lab 10/23/23 1216 10/23/23 1252 10/23/23 1702  GLUCAP 129* 114* 128*    Microbiology Studies:   Recent Results (from the past 240 hours)  Culture, blood (Routine X 2) w Reflex to ID Panel     Status: None (Preliminary result)   Collection Time: 10/20/23 10:00 PM   Specimen: BLOOD LEFT HAND  Result Value Ref Range Status   Specimen Description BLOOD LEFT HAND  Final   Special Requests   Final    BOTTLES DRAWN AEROBIC AND ANAEROBIC Blood Culture adequate volume   Culture   Final    NO GROWTH 3 DAYS Performed at [facility], [address], [place], [place] [address]    Report Status PENDING  Incomplete  Culture, blood (Routine X 2) w Reflex to ID Panel     Status: None (Preliminary result)   Collection Time: 10/20/23 10:02 PM   Specimen: BLOOD LEFT ARM  Result Value Ref Range Status   Specimen Description BLOOD LEFT ARM  Final   Special Requests   Final    BOTTLES DRAWN AEROBIC AND ANAEROBIC Blood Culture adequate volume   Culture   Final  NO GROWTH 3 DAYS Performed at [facility], [address], [place], [place] [address]    Report Status PENDING  Incomplete  Surgical pcr screen     Status: None   Collection Time: 10/21/23  9:19 PM   Specimen: Nasal Mucosa; Nasal Swab  Result Value Ref Range Status   MRSA, PCR NEGATIVE NEGATIVE Final   Staphylococcus aureus NEGATIVE NEGATIVE Final    Comment: (NOTE) The Xpert SA Assay (FDA approved for NASAL specimens in patients 64 years of age and older), is one component of a comprehensive surveillance program. It is not intended to diagnose infection nor to guide or monitor treatment. Performed at [facility], [address], [place], [place] [address]   Aerobic/Anaerobic Culture w Gram Stain (surgical/deep wound)     Status: None (Preliminary result)   Collection Time: 10/23/23 11:39 AM   Specimen: PATH Soft tissue  Result Value Ref Range Status    Specimen Description TISSUE  Final   Special Requests A  Final   Gram Stain   Final    NO WBC SEEN NO ORGANISMS SEEN Performed at [facility], [address], [place], [place] [address]    Culture PENDING  Incomplete   Report Status PENDING  Incomplete    Radiology Studies:  No results found.  Scheduled Meds:    atorvastatin   40 mg Oral Daily   clopidogrel   75 mg Oral Daily   [START ON 10/24/2023] docusate sodium   100 mg Oral Daily   [START ON 10/24/2023] enoxaparin  (LOVENOX ) injection  50 mg Subcutaneous Daily   ferrous sulfate   325 mg Oral Q breakfast   gabapentin   100 mg Oral TID   insulin  aspart  0-5 Units Subcutaneous QHS   insulin  aspart  0-6 Units Subcutaneous TID WC   insulin  glargine-yfgn  10 Units Subcutaneous QHS   levothyroxine   150 mcg Oral QAC breakfast   pantoprazole   40 mg Oral BID   potassium chloride   40 mEq Oral Once   sodium chloride  flush  10-40 mL Intracatheter Q12H   sodium chloride  flush  3-10 mL Intravenous Q12H    Continuous Infusions:    DAPTOmycin 700 mg (10/23/23 1328)   magnesium  sulfate bolus IVPB     magnesium  sulfate bolus IVPB       LOS: 3 days     Trenda Mar, MD,  FACP, [facility], [facility], [facility]   [facility] Advisor [place]      To contact the attending provider between 7A-7P or the covering provider during after hours 7P-7A, please log into the web site [email or web address] and access using universal [place] password for that web site. If you do not have the password, please call the hospital operator.  10/23/2023, 7:08 PM

## 2023-10-23 NOTE — Interval H&P Note (Signed)
 History and Physical Interval Note:  10/23/2023 6:46 AM  Kayla Schmitt  has presented today for surgery, with the diagnosis of Necrotizing Fasciitis Left Leg.  The various methods of treatment have been discussed with the patient and family. After consideration of risks, benefits and other options for treatment, the patient has consented to  Procedure(s) with comments: REVISION AMPUTATION, BELOW THE KNEE (Left) - REVISION LEFT BELOW KNEE AMPUTATION as a surgical intervention.  The patient's history has been reviewed, patient examined, no change in status, stable for surgery.  I have reviewed the patient's chart and labs.  Questions were answered to the patient's satisfaction.     Aditya Nastasi V Hadlei Stitt

## 2023-10-23 NOTE — Progress Notes (Signed)
 Inpatient Rehabilitation Admissions Coordinator   Rehab consult received. I await postop therapy assessments to assist with planning rehab venue options.  Heron Leavell, RN, MSN Rehab Admissions Coordinator [phone] 10/23/2023 6:20 PM

## 2023-10-23 NOTE — Progress Notes (Signed)
 Pharmacy: Antimicrobial Stewardship Note  71 YOM being seen by the ID service for MRSA bacteremia  Results uploaded into care everywhere for OSH blood cultures on 6/20 which resulted as MRSA:  Blood Culture, Routine Methicillin resistant Staphylococcus aureus Abnormal  [facility] Blood cultures positive for Staphylococcus aureus require Infectious Diseases consultation due to increased morbidity and mortality.  Gram Stain Result Gram positive cocci [facility]   Susceptibility  Organism Antibiotic Method Susceptibility  Methicillin resistant Staphylococcus aureus Nafcillin KIRBY BAUER Resistant   Comment: Methicillin-resistant staphylococci are resistant to all currently available beta-lactam antibiotics EXCEPT ceftaroline.[facility] lab at [phone] to request ceftaroline susceptibility testing.  Methicillin resistant Staphylococcus aureus Gentamicin KIRBY BAUER Susceptible   Comment: Gentamicin is used only in combination with other active agents that test susceptible  Methicillin resistant Staphylococcus aureus Vancomycin  MIC SUSCEPTIBILITY RESULT 2: Susceptible  Methicillin resistant Staphylococcus aureus Fluoroquinolone KIRBY BAUER No Interpretation   Comment: Fluoroquinolones are not indicated for the treatment of staphylococcal infections, including MRSA.  Methicillin resistant Staphylococcus aureus Daptomycin MIC SUSCEPTIBILITY RESULT 0.5: Susceptible  Specimen Collected: 10/18/23 08:32   Performed by: [facility] Last Resulted: 10/22/23 14:49  Received From: [facility]  Result Received: 10/23/23 00:01   Noted Vanc MIC creep up to 2 making achievement of AUC:MIC ratio of 400:1 hard to optimize and plan at this time is to transition to Daptomycin. Daptomycin MIC 0.5 (CLSI </=1 is sensitive) and better to utilize at this time.  Repeat blood cultures on admission to [facility] on 6/22 ngx3d  Plan - D/c  Vancomycin  - Start Daptomycin 700 mg IV every 24 hours - CK w/ AM labs then weekly  Thank you for allowing pharmacy to be a part of this patient's care.  Almarie Lunger, PharmD, BCPS, BCIDP Infectious Diseases Clinical Pharmacist 10/23/2023 12:12 PM   **Pharmacist phone directory can now be found on [email or web address] ([email or web address]).  Listed under [facility].

## 2023-10-23 NOTE — Progress Notes (Signed)
 ID brief note   Afebrile Patient off the floor for OR  10/18/23 MRSA MIC for Vancomycin  2 and Daptomycin 0.5 ( S)  Will switch Vancomycin  to Daptomycin FU pending cultures Plan for TEE tomorrow Monitor CBC, BMP and Vancomycin  trough Maintain contact precautions  Annalee Orem, MD Infectious Disease Physician [facility] [address] [place], [place] [address] Phone: [phone]  Fax: [phone]

## 2023-10-23 NOTE — Plan of Care (Signed)
  Problem: Education: Goal: Knowledge of General Education information will improve Description: Including pain rating scale, medication(s)/side effects and non-pharmacologic comfort measures Outcome: Progressing   Problem: Health Behavior/Discharge Planning: Goal: Ability to manage health-related needs will improve Outcome: Progressing   Problem: Clinical Measurements: Goal: Ability to maintain clinical measurements within normal limits will improve Outcome: Progressing Goal: Will remain free from infection Outcome: Progressing Goal: Diagnostic test results will improve Outcome: Progressing Goal: Respiratory complications will improve Outcome: Progressing Goal: Cardiovascular complication will be avoided Outcome: Progressing   Problem: Activity: Goal: Risk for activity intolerance will decrease Outcome: Progressing   Problem: Nutrition: Goal: Adequate nutrition will be maintained Outcome: Progressing   Problem: Coping: Goal: Level of anxiety will decrease Outcome: Progressing   Problem: Elimination: Goal: Will not experience complications related to bowel motility Outcome: Progressing Goal: Will not experience complications related to urinary retention Outcome: Progressing   Problem: Pain Managment: Goal: General experience of comfort will improve and/or be controlled Outcome: Progressing   Problem: Safety: Goal: Ability to remain free from injury will improve Outcome: Progressing   Problem: Skin Integrity: Goal: Risk for impaired skin integrity will decrease Outcome: Progressing   Problem: Education: Goal: Ability to describe self-care measures that may prevent or decrease complications (Diabetes Survival Skills Education) will improve Outcome: Progressing Goal: Individualized Educational Video(s) Outcome: Progressing   Problem: Coping: Goal: Ability to adjust to condition or change in health will improve Outcome: Progressing   Problem: Fluid  Volume: Goal: Ability to maintain a balanced intake and output will improve Outcome: Progressing   Problem: Health Behavior/Discharge Planning: Goal: Ability to identify and utilize available resources and services will improve Outcome: Progressing Goal: Ability to manage health-related needs will improve Outcome: Progressing   Problem: Metabolic: Goal: Ability to maintain appropriate glucose levels will improve Outcome: Progressing   Problem: Nutritional: Goal: Maintenance of adequate nutrition will improve Outcome: Progressing Goal: Progress toward achieving an optimal weight will improve Outcome: Progressing   Problem: Skin Integrity: Goal: Risk for impaired skin integrity will decrease Outcome: Progressing   Problem: Tissue Perfusion: Goal: Adequacy of tissue perfusion will improve Outcome: Progressing   Problem: Fluid Volume: Goal: Hemodynamic stability will improve Outcome: Progressing   Problem: Clinical Measurements: Goal: Diagnostic test results will improve Outcome: Progressing Goal: Signs and symptoms of infection will decrease Outcome: Progressing   Problem: Respiratory: Goal: Ability to maintain adequate ventilation will improve Outcome: Progressing   Problem: Education: Goal: Knowledge of the prescribed therapeutic regimen will improve Outcome: Progressing Goal: Ability to verbalize activity precautions or restrictions will improve Outcome: Progressing Goal: Understanding of discharge needs will improve Outcome: Progressing   Problem: Activity: Goal: Ability to perform//tolerate increased activity and mobilize with assistive devices will improve Outcome: Progressing   Problem: Clinical Measurements: Goal: Postoperative complications will be avoided or minimized Outcome: Progressing   Problem: Self-Care: Goal: Ability to meet self-care needs will improve Outcome: Progressing   Problem: Self-Concept: Goal: Ability to maintain and perform role  responsibilities to the fullest extent possible will improve Outcome: Progressing   Problem: Pain Management: Goal: Pain level will decrease with appropriate interventions Outcome: Progressing   Problem: Skin Integrity: Goal: Demonstration of wound healing without infection will improve Outcome: Progressing

## 2023-10-23 NOTE — Anesthesia Procedure Notes (Signed)
 Anesthesia Regional Block: Popliteal block   Pre-Anesthetic Checklist: , timeout performed,  Correct Patient, Correct Site, Correct Laterality,  Correct Procedure, Correct Position, site marked,  Risks and benefits discussed,  Surgical consent,  Pre-op evaluation,  At surgeon's request and post-op pain management  Laterality: Left  Prep: chloraprep       Needles:  Injection technique: Single-shot  Needle Type: Echogenic Stimulator Needle     Needle Length: 9cm  Needle Gauge: 21     Additional Needles:   Procedures:,,,, ultrasound used (permanent image in chart),,    Narrative:  Start time: 10/23/2023 10:10 AM End time: 10/23/2023 10:15 AM Injection made incrementally with aspirations every 5 mL.  Performed by: Personally  Anesthesiologist: Peggye Delon Brunswick, MD  Additional Notes: Discussed risks and benefits of nerve block including, but not limited to, prolonged and/or permanent nerve injury involving sensory and/or motor function. Monitors were applied and a time-out was performed. The nerve and associated structures were visualized under ultrasound guidance. After negative aspiration, local anesthetic was slowly injected around the nerve. There was no evidence of high pressure during the procedure. There were no paresthesias. VSS remained stable and the patient tolerated the procedure well.

## 2023-10-23 NOTE — Anesthesia Postprocedure Evaluation (Signed)
 Anesthesia Post Note  Patient: Kayla Schmitt  Procedure(s) Performed: REVISION AMPUTATION, BELOW THE KNEE (Left)     Patient location during evaluation: PACU Anesthesia Type: MAC and Regional Level of consciousness: awake Pain management: pain level controlled Vital Signs Assessment: post-procedure vital signs reviewed and stable Respiratory status: spontaneous breathing, nonlabored ventilation and respiratory function stable Cardiovascular status: stable and blood pressure returned to baseline Postop Assessment: no apparent nausea or vomiting Anesthetic complications: no   There were no known notable events for this encounter.  Last Vitals:  Vitals:   10/23/23 1230 10/23/23 1247  BP: (!) 127/54 125/60  Pulse: 69 66  Resp: 12 16  Temp: 36.6 C   SpO2: 99% 100%    Last Pain:  Vitals:   10/23/23 1247  TempSrc:   PainSc: 0-No pain                 Delon Aisha Arch

## 2023-10-23 NOTE — Progress Notes (Signed)
   [facility] has been requested to perform a transesophageal echocardiogram on Kayla Schmitt for bacteremia.     The patient does NOT have any absolute or relative contraindications to a Transesophageal Echocardiogram (TEE).  The patient has: No other conditions that may impact this procedure.    After careful review of history and examination, the risks and benefits of transesophageal echocardiogram have been explained including risks of esophageal damage, perforation (1:10,000 risk), bleeding, pharyngeal hematoma as well as other potential complications associated with conscious sedation including aspiration, arrhythmia, respiratory failure and death. Alternatives to treatment were discussed, questions were answered. Patient is willing to proceed.   Signed, Waddell DELENA Donath, PA-C  10/23/2023 1:22 PM

## 2023-10-23 NOTE — Progress Notes (Signed)
   10/23/23 0911  Spiritual Encounters  Type of Visit Initial  Care provided to: Patient  Referral source Clinical staff  Reason for visit Advance directives  OnCall Visit No  Spiritual Framework  Presenting Themes Impactful experiences and emotions  Community/Connection Family  Patient Stress Factors Health changes  Interventions  Spiritual Care Interventions Made Prayer;Established relationship of care and support;Encouragement  Intervention Outcomes  Outcomes Awareness of health;Awareness of support;Reduced anxiety;Reduced fear  Mental Health Advance Directives  Does Patient Have a Mental Health Advance Directive? No  Would patient like information on creating a mental health advance directive? No - Patient declined   Chaplain stopped by to visit the Pt. And was able to engage in conversation and reflection. The Pt. Appeared to be in good spirits and shared that she will be undergoing surgery today.  Chaplain affirmed ongoing support and plans to follow  up post-op. During the visit, the Advance Directive (A.D.) document was discussed. The Pt. Stated that she does not wish to complete it at this time, as her mother is designated as her POA.   Spiritual care remains available as needed.

## 2023-10-23 NOTE — Anesthesia Procedure Notes (Signed)
 Anesthesia Regional Block: Adductor canal block   Pre-Anesthetic Checklist: , timeout performed,  Correct Patient, Correct Site, Correct Laterality,  Correct Procedure, Correct Position, site marked,  Risks and benefits discussed,  Surgical consent,  Pre-op evaluation,  At surgeon's request and post-op pain management  Laterality: Left  Prep: chloraprep       Needles:  Injection technique: Single-shot  Needle Type: Echogenic Stimulator Needle     Needle Length: 9cm  Needle Gauge: 21     Additional Needles:   Procedures:,,,, ultrasound used (permanent image in chart),,    Narrative:  Start time: 10/23/2023 10:15 AM End time: 10/23/2023 10:18 AM Injection made incrementally with aspirations every 5 mL.  Performed by: Personally  Anesthesiologist: Peggye Delon Brunswick, MD  Additional Notes: Discussed risks and benefits of nerve block including, but not limited to, prolonged and/or permanent nerve injury involving sensory and/or motor function. Monitors were applied and a time-out was performed. The nerve and associated structures were visualized under ultrasound guidance. After negative aspiration, local anesthetic was slowly injected around the nerve. There was no evidence of high pressure during the procedure. There were no paresthesias. VSS remained stable and the patient tolerated the procedure well.

## 2023-10-23 NOTE — H&P (View-Only) (Signed)
 PROGRESS NOTE   Kayla Schmitt  FMW:[id number]    DOB: Dec 06, 1965    DOA: 10/20/2023  PCP: [name], NP   I have briefly reviewed patients previous medical records in [facility].   Brief Hospital Course:  58 y.o. 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  Sepsis protocol was initiated and she was started on vancomycin  and cefepime .  Blood cultures grew gram-positive cocci but no clear source of infection.  CT of chest abdomen and pelvis without clear abnormality.  Venous Doppler negative for DVT.  She was noted to have purple spotty area of discoloration of left leg.  She was then transferred to [facility] for vascular surgery consult. At [facility] patient has been treated for sepsis with endorgan dysfunction including elevated lactic acid and AKI, peripheral vascular disease with increased pain of the left lower extremity, AKI, altered mental status, DM type II and elevated troponin secondary to demand ischemia. Patient also received 1 unit of blood transfusion as hemoglobin dropped 8.2-7. No GI bleed at outside hospital.    Assessment & Plan:   PAD bilateral lower extremities, necrotizing fasciitis of left lower extremity Vascular surgery and orthopedics were consulted She has history of bilateral transmetatarsal amputations and previous bilateral lower extremity endovascular revascularization. S/p left BKA with wound VAC placement on 6/24 by Dr. Fonda Rim S/p revision amputation, below the knee on 6/25 by Dr. Harden  MRSA bacteremia Surgical intervention on left lower extremity as noted above. ID managing antimicrobial treatment Vancomycin   switched to daptomycin. Plan for TEE 6/26. Surveillance blood cultures x 2 from 6/22, negative to date.  Acute kidney injury Suspecting prerenal in the setting of sepsis and hypotension Resolved.  Avoid nephrotoxics and hypotension.  Elevated troponin secondary to demand ischemia/history of CAD Outside facility troponin, 696 > 576 > 532.  No anginal symptoms or acute EKG changes. TTE: LVEF 50-55%, grade 2 diastolic dysfunction. Was on IV Dieulafoy heparin  drip for PAD indication which was discontinued on 6/24. Continue atorvastatin  and Plavix .  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 Hemoglobin has gradually drifted down to 7.9, likely related to multiple surgeries Patient received a unit of PRBC at outside hospital for hemoglobin of 7. Follow CBCs daily and transfuse for hemoglobin 7 g or less  Insulin -dependent type 2 DM Good inpatient control on current dose of Semglee  10 units at bedtime and NovoLog  SSI  Chronic combined systolic and diastolic CHF EF appears to have improved compared to prior.  Clinically euvolemic.  Hypertension Intermittent soft blood pressures Monitoring off of antihypertensives  Prolonged QT Minimize QT prolonging drugs Last EKG on 6/22 showed QTc of 486 ms.  Hypothyroidism Continue with levothyroxine .  Body mass index is 38.07 kg/m.   DVT prophylaxis: SCD's Start: 10/23/23 1248 SCDs Start: 10/20/23 2037     Code Status: Full Code:  Family Communication: Daughter at bedside Disposition:  Status is: Inpatient Remains inpatient appropriate because: IV antibiotics, TEE planned for tomorrow, immediate postop.     Consultants:   Vascular surgery Orthopedics Infectious disease  Procedures:   As above  Subjective:  Patient was in OR this morning.  Saw her late this  afternoon.  Denies complaints.  Specifically denies pain.  States that she is  going for TEE tomorrow.  Objective:   Vitals:   10/23/23 1024 10/23/23 1215 10/23/23 1230 10/23/23 1247  BP: (!) 107/59 (!) 105/59 (!) 127/54 125/60  Pulse: 66 64 69 66  Resp: (!) 7 15 12 16   Temp:  97.8 F (36.6 C) 97.8 F (36.6 C)   TempSrc:      SpO2: 98% 100% 99% 100%  Weight:      Height:        General exam: Young female, moderately built and nourished lying comfortably propped up in bed without distress. Respiratory system: Clear to auscultation. Respiratory effort normal. Cardiovascular system: S1 & S2 heard, RRR. No JVD, murmurs, rubs, gallops or clicks. No right lower extremity pedal edema.  Telemetry personally reviewed: Sinus rhythm. Gastrointestinal system: Abdomen is nondistended, soft and nontender. No organomegaly or masses felt. Normal bowel sounds heard. Central nervous system: Alert and oriented. No focal neurological deficits. Extremities: Symmetric 5 x 5 power.  Healed right transmetatarsal amputation stump.  Left BKA stump site with wound VAC and postop dressing clean and dry. Skin: No rashes, lesions or ulcers Psychiatry: Judgement and insight appear normal. Mood & affect appropriate.     Data Reviewed:   I have personally reviewed following labs and imaging studies   CBC: Recent Labs  Lab 10/21/23 0730 10/22/23 0218 10/23/23 0435  WBC 13.1* 11.4* 9.5  HGB 10.0* 8.3* 7.9*  HCT 30.1* 24.9* 24.8*  MCV 88.8 87.1 90.5  PLT 238 229 260    Basic Metabolic Panel: Recent Labs  Lab 10/20/23 2200 10/21/23 1230 10/22/23 0218 10/23/23 0435  NA 132* 134* 135 135  K 3.2* 4.0 3.5 4.1  CL 103 104 107 109  CO2 19* 21* 20* 22  GLUCOSE 167* 170* 153* 187*  BUN 26* 19 19 19   CREATININE 1.34* 1.25* 1.15* 1.07*  CALCIUM  7.2* 7.5* 7.5* 7.3*  MG  --  1.9  --   --     Liver Function Tests: Recent Labs  Lab 10/20/23 2200 10/22/23 0218 10/23/23 0435  AST 81* 59* 35  ALT 42 37 28  ALKPHOS 143* 170* 171*  BILITOT 0.6 0.8 0.7  PROT 5.0* 5.0* 5.2*   ALBUMIN <1.5* <1.5* <1.5*    CBG: Recent Labs  Lab 10/23/23 1216 10/23/23 1252 10/23/23 1702  GLUCAP 129* 114* 128*    Microbiology Studies:   Recent Results (from the past 240 hours)  Culture, blood (Routine X 2) w Reflex to ID Panel     Status: None (Preliminary result)   Collection Time: 10/20/23 10:00 PM   Specimen: BLOOD LEFT HAND  Result Value Ref Range Status   Specimen Description BLOOD LEFT HAND  Final   Special Requests   Final    BOTTLES DRAWN AEROBIC AND ANAEROBIC Blood Culture adequate volume   Culture   Final    NO GROWTH 3 DAYS Performed at [facility], [address], [place], [place] [address]    Report Status PENDING  Incomplete  Culture, blood (Routine X 2) w Reflex to ID Panel     Status: None (Preliminary result)   Collection Time: 10/20/23 10:02 PM   Specimen: BLOOD LEFT ARM  Result Value Ref Range Status   Specimen Description BLOOD LEFT ARM  Final   Special Requests   Final    BOTTLES DRAWN AEROBIC AND ANAEROBIC Blood Culture adequate volume   Culture   Final  NO GROWTH 3 DAYS Performed at [facility], [address], [place], [place] [address]    Report Status PENDING  Incomplete  Surgical pcr screen     Status: None   Collection Time: 10/21/23  9:19 PM   Specimen: Nasal Mucosa; Nasal Swab  Result Value Ref Range Status   MRSA, PCR NEGATIVE NEGATIVE Final   Staphylococcus aureus NEGATIVE NEGATIVE Final    Comment: (NOTE) The Xpert SA Assay (FDA approved for NASAL specimens in patients 64 years of age and older), is one component of a comprehensive surveillance program. It is not intended to diagnose infection nor to guide or monitor treatment. Performed at [facility], [address], [place], [place] [address]   Aerobic/Anaerobic Culture w Gram Stain (surgical/deep wound)     Status: None (Preliminary result)   Collection Time: 10/23/23 11:39 AM   Specimen: PATH Soft tissue  Result Value Ref Range Status    Specimen Description TISSUE  Final   Special Requests A  Final   Gram Stain   Final    NO WBC SEEN NO ORGANISMS SEEN Performed at [facility], [address], [place], [place] [address]    Culture PENDING  Incomplete   Report Status PENDING  Incomplete    Radiology Studies:  No results found.  Scheduled Meds:    atorvastatin   40 mg Oral Daily   clopidogrel   75 mg Oral Daily   [START ON 10/24/2023] docusate sodium   100 mg Oral Daily   [START ON 10/24/2023] enoxaparin  (LOVENOX ) injection  50 mg Subcutaneous Daily   ferrous sulfate   325 mg Oral Q breakfast   gabapentin   100 mg Oral TID   insulin  aspart  0-5 Units Subcutaneous QHS   insulin  aspart  0-6 Units Subcutaneous TID WC   insulin  glargine-yfgn  10 Units Subcutaneous QHS   levothyroxine   150 mcg Oral QAC breakfast   pantoprazole   40 mg Oral BID   potassium chloride   40 mEq Oral Once   sodium chloride  flush  10-40 mL Intracatheter Q12H   sodium chloride  flush  3-10 mL Intravenous Q12H    Continuous Infusions:    DAPTOmycin 700 mg (10/23/23 1328)   magnesium  sulfate bolus IVPB     magnesium  sulfate bolus IVPB       LOS: 3 days     Kayla Mar, MD,  FACP, [facility], [facility], [facility]   [facility] Ad[facility] [place]      To contact the attending provider between 7A-7P or the covering provider during after hours 7P-7A, please log into the web site [email or web address] and access using universal [place] password for that web site. If you do not have the password, please call the hospital operator.  10/23/2023, 7:08 PM

## 2023-10-24 ENCOUNTER — Inpatient Hospital Stay (HOSPITAL_COMMUNITY): Payer: Self-pay | Admitting: Certified Registered"

## 2023-10-24 ENCOUNTER — Encounter (HOSPITAL_COMMUNITY)
Admission: EM | Disposition: A | Discharge status: Rehabilitation Facility | Payer: Self-pay | Source: Other Acute Inpatient Hospital | Attending: Internal Medicine

## 2023-10-24 ENCOUNTER — Encounter (HOSPITAL_COMMUNITY): Payer: Self-pay | Admitting: Cardiology

## 2023-10-24 ENCOUNTER — Inpatient Hospital Stay (HOSPITAL_COMMUNITY)

## 2023-10-24 DIAGNOSIS — R7881 Bacteremia: Secondary | ICD-10-CM

## 2023-10-24 DIAGNOSIS — I110 Hypertensive heart disease with heart failure: Secondary | ICD-10-CM

## 2023-10-24 DIAGNOSIS — I350 Nonrheumatic aortic (valve) stenosis: Secondary | ICD-10-CM | POA: Diagnosis not present

## 2023-10-24 DIAGNOSIS — I2510 Atherosclerotic heart disease of native coronary artery without angina pectoris: Secondary | ICD-10-CM | POA: Diagnosis not present

## 2023-10-24 DIAGNOSIS — M726 Necrotizing fasciitis: Secondary | ICD-10-CM | POA: Diagnosis not present

## 2023-10-24 DIAGNOSIS — I509 Heart failure, unspecified: Secondary | ICD-10-CM

## 2023-10-24 DIAGNOSIS — Z89522 Acquired absence of left knee: Secondary | ICD-10-CM | POA: Diagnosis not present

## 2023-10-24 DIAGNOSIS — I351 Nonrheumatic aortic (valve) insufficiency: Secondary | ICD-10-CM | POA: Diagnosis not present

## 2023-10-24 DIAGNOSIS — B9562 Methicillin resistant Staphylococcus aureus infection as the cause of diseases classified elsewhere: Secondary | ICD-10-CM | POA: Diagnosis not present

## 2023-10-24 DIAGNOSIS — I340 Nonrheumatic mitral (valve) insufficiency: Secondary | ICD-10-CM

## 2023-10-24 HISTORY — PX: TRANSESOPHAGEAL ECHOCARDIOGRAM (CATH LAB): EP1270

## 2023-10-24 LAB — CBC
HCT: 24.8 % — ABNORMAL LOW (ref 36.0–46.0)
Hemoglobin: 7.6 g/dL — ABNORMAL LOW (ref 12.0–15.0)
MCH: 28.5 pg (ref 26.0–34.0)
MCHC: 30.6 g/dL (ref 30.0–36.0)
MCV: 92.9 fL (ref 80.0–100.0)
Platelets: 268 10*3/uL (ref 150–400)
RBC: 2.67 MIL/uL — ABNORMAL LOW (ref 3.87–5.11)
RDW: 15.4 % (ref 11.5–15.5)
WBC: 10.1 10*3/uL (ref 4.0–10.5)
nRBC: 0 % (ref 0.0–0.2)

## 2023-10-24 LAB — COMPREHENSIVE METABOLIC PANEL WITH GFR
ALT: 26 U/L (ref 0–44)
AST: 50 U/L — ABNORMAL HIGH (ref 15–41)
Albumin: <1.5 g/dL — ABNORMAL LOW (ref 3.5–5.0)
Alkaline Phosphatase: 176 U/L — ABNORMAL HIGH (ref 38–126)
Anion gap: 7 (ref 5–15)
BUN: 19 mg/dL (ref 6–20)
CO2: 21 mmol/L — ABNORMAL LOW (ref 22–32)
Calcium: 7.6 mg/dL — ABNORMAL LOW (ref 8.9–10.3)
Chloride: 110 mmol/L (ref 98–111)
Creatinine, Ser: 1.15 mg/dL — ABNORMAL HIGH (ref 0.44–1.00)
GFR, Estimated: 56 mL/min — ABNORMAL LOW (ref >60)
Glucose, Bld: 115 mg/dL — ABNORMAL HIGH (ref 70–99)
Potassium: 3.5 mmol/L (ref 3.5–5.1)
Sodium: 138 mmol/L (ref 135–145)
Total Bilirubin: 0.4 mg/dL (ref 0.0–1.2)
Total Protein: 5.3 g/dL — ABNORMAL LOW (ref 6.5–8.1)

## 2023-10-24 LAB — ECHO TEE
AR max vel: 1.1 cm2
AV Area VTI: 1.07 cm2
AV Area mean vel: 1.16 cm2
AV Mean grad: 27.3 mmHg
AV Peak grad: 46.3 mmHg
Ao pk vel: 3.4 m/s

## 2023-10-24 LAB — GLUCOSE, CAPILLARY
Glucose-Capillary: 233 mg/dL — ABNORMAL HIGH (ref 70–99)
Glucose-Capillary: 128 mg/dL — ABNORMAL HIGH (ref 70–99)
Glucose-Capillary: 116 mg/dL — ABNORMAL HIGH (ref 70–99)
Glucose-Capillary: 123 mg/dL — ABNORMAL HIGH (ref 70–99)

## 2023-10-24 LAB — CK: Total CK: 522 U/L — ABNORMAL HIGH (ref 38–234)

## 2023-10-24 SURGERY — TRANSESOPHAGEAL ECHOCARDIOGRAM (TEE) (CATHLAB)
Anesthesia: Monitor Anesthesia Care

## 2023-10-24 MED ORDER — MAGNESIUM SULFATE 2 GM/50ML IV SOLN
2.0000 g | Freq: Once | INTRAVENOUS | Status: AC
  Administered 2023-10-24: 2 g via INTRAVENOUS
  Filled 2023-10-24: qty 50

## 2023-10-24 MED ORDER — PROPOFOL 10 MG/ML IV BOLUS
INTRAVENOUS | Status: DC | PRN
  Administered 2023-10-24: 60 mg via INTRAVENOUS
  Administered 2023-10-24: 30 mg via INTRAVENOUS

## 2023-10-24 MED ORDER — SODIUM CHLORIDE 0.9 % IV SOLN: INTRAVENOUS | Status: DC | PRN

## 2023-10-24 MED ORDER — POTASSIUM CHLORIDE CRYS ER 20 MEQ PO TBCR
40.0000 meq | EXTENDED_RELEASE_TABLET | Freq: Once | ORAL | Status: AC
  Administered 2023-10-24: 40 meq via ORAL
  Filled 2023-10-24: qty 2

## 2023-10-24 MED ORDER — LIDOCAINE 2% (20 MG/ML) 5 ML SYRINGE
INTRAMUSCULAR | Status: DC | PRN
  Administered 2023-10-24: 60 mg via INTRAVENOUS

## 2023-10-24 MED ORDER — PROPOFOL 500 MG/50ML IV EMUL
INTRAVENOUS | Status: DC | PRN
  Administered 2023-10-24: 100 ug/kg/min via INTRAVENOUS

## 2023-10-24 NOTE — Anesthesia Postprocedure Evaluation (Signed)
 Anesthesia Post Note  Patient: Kayla Schmitt  Procedure(s) Performed: TRANSESOPHAGEAL ECHOCARDIOGRAM     Patient location during evaluation: PACU Anesthesia Type: MAC Level of consciousness: awake and alert Pain management: pain level controlled Vital Signs Assessment: post-procedure vital signs reviewed and stable Respiratory status: spontaneous breathing, nonlabored ventilation, respiratory function stable and patient connected to nasal cannula oxygen  Cardiovascular status: stable and blood pressure returned to baseline Postop Assessment: no apparent nausea or vomiting Anesthetic complications: no   No notable events documented.  Last Vitals:  Vitals:   10/24/23 1315 10/24/23 1350  BP: (!) 152/65 (!) 152/66  Pulse: 82 84  Resp: 16   Temp:  36.8 C  SpO2: 96% 98%    Last Pain:  Vitals:   10/24/23 1400  TempSrc:   PainSc: 6                  Charmel Pronovost

## 2023-10-24 NOTE — Anesthesia Preprocedure Evaluation (Signed)
 Anesthesia Evaluation  Patient identified by MRN, date of birth, ID band Patient awake    Reviewed: Allergy & Precautions, NPO status , Patient's Chart, lab work & pertinent test results  History of Anesthesia Complications (+) PONV and history of anesthetic complications  Airway Mallampati: III  TM Distance: >3 FB Neck ROM: Full    Dental  (+) Dental Advisory Given   Pulmonary neg shortness of breath, neg sleep apnea, neg COPD, neg recent URI, former smoker   Pulmonary exam normal breath sounds clear to auscultation       Cardiovascular hypertension, Pt. on home beta blockers + CAD, + Past MI, + Peripheral Vascular Disease and +CHF  + dysrhythmias (prolonged QT) + Valvular Problems/Murmurs (moderate MR, mild-to-moderate AS)  Rhythm:Regular Rate:Normal  HLD  TTE 10/21/2023: IMPRESSIONS     1. Left ventricular ejection fraction, by estimation, is 50 to 55%. The  left ventricle has low normal function. The left ventricle has no regional  wall motion abnormalities. Left ventricular diastolic parameters are  consistent with Grade II diastolic  dysfunction (pseudonormalization).   2. Right ventricular systolic function was not well visualized. The right  ventricular size is not well visualized.   3. The mitral valve is degenerative. Moderate mitral valve regurgitation.  No evidence of mitral stenosis.   4. The aortic valve is normal in structure. There is severe calcifcation  of the aortic valve. There is severe thickening of the aortic valve.  Aortic valve regurgitation is mild. Mild to moderate aortic valve  stenosis. Aortic valve mean gradient measures   17.0 mmHg. Aortic valve Vmax measures 2.66 m/s.   5. The inferior vena cava is normal in size with greater than 50%  respiratory variability, suggesting right atrial pressure of 3 mmHg.     Neuro/Psych neg Seizures    GI/Hepatic Neg liver ROS,GERD  Medicated,,   Endo/Other  diabetes, Type 2, Insulin  DependentHypothyroidism    Renal/GU      Musculoskeletal  (+) Arthritis ,    Abdominal  (+) + obese  Peds  Hematology  (+) Blood dyscrasia, anemia Lab Results      Component                Value               Date                      WBC                      11.4 (H)            10/22/2023                HGB                      8.3 (L)             10/22/2023                HCT                      24.9 (L)            10/22/2023                MCV                      87.1  10/22/2023                PLT                      229                 10/22/2023              Anesthesia Other Findings On Plavix   Reproductive/Obstetrics                             Anesthesia Physical Anesthesia Plan  ASA: 3  Anesthesia Plan: MAC   Post-op Pain Management: Tylenol  PO (pre-op)* and Gabapentin  PO (pre-op)*   Induction: Intravenous  PONV Risk Score and Plan: 3 and Propofol  infusion and Treatment may vary due to age or medical condition  Airway Management Planned: Natural Airway and Nasal Cannula  Additional Equipment:   Intra-op Plan:   Post-operative Plan:   Informed Consent: I have reviewed the patients History and Physical, chart, labs and discussed the procedure including the risks, benefits and alternatives for the proposed anesthesia with the patient or authorized representative who has indicated his/her understanding and acceptance.     Dental advisory given  Plan Discussed with: CRNA and Anesthesiologist  Anesthesia Plan Comments: (Discussed with patient risks of MAC including, but not limited to, minor pain or discomfort, hearing people in the room, and possible need for backup general anesthesia. Risks for general anesthesia also discussed including, but not limited to, sore throat, hoarse voice, chipped/damaged teeth, injury to vocal cords, nausea and vomiting, allergic reactions, lung  infection, heart attack, stroke, and death. All questions answered. )       Anesthesia Quick Evaluation

## 2023-10-24 NOTE — Progress Notes (Signed)
 Patient ID: Kayla Schmitt, female   DOB: Jul 26, 1965, 58 y.o.   MRN: [id number] Patient is postoperative day 1 revision below-knee amputation for necrotizing fasciitis.  Patient had necrotic muscle fascia and skin along the wound margins.  This was resected as well as the distal 2 cm of bone.  Cultures are pending from soft tissue.  Anticipate discharge to inpatient versus outpatient rehab.

## 2023-10-24 NOTE — Evaluation (Signed)
 Physical Therapy Evaluation Patient Details Name: Kayla Schmitt MRN: [id number] DOB: July 08, 1965 Today's Date: 10/24/2023  History of Present Illness  Pt is a 58 y.o female admitted 6/22 for lethargy & LLE pain. Sepsis protocol initiated. Negative for osteomyelitis & DVT. 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  Clinical Impression  PTA pt living alone in single story home with ramped entrance. Pt completely independent in mobilization with RW or  wheelchair, independent with ADLs, and iADLs, working full time out of the home. Pt is currently limited in safe mobility by increased L LE pain and difficulty with mobilizing with residual limb protector. Evaluation limited due to upcoming TEE. Pt able to perform bed mobility with mod I, needs contact guard for management of bed pad to perform lateral scoots along side of bed. Patient will benefit from intensive inpatient follow-up therapy, >3 hours/day. PT will continue to follow acutely.         If plan is discharge home, recommend the following: A lot of help with walking and/or transfers;A lot of help with bathing/dressing/bathroom;Assistance with cooking/housework;Assist for transportation;Help with stairs or ramp for entrance   Can travel by private vehicle        Equipment Recommendations None recommended by PT  Recommendations for Other Services  Rehab consult    Functional Status Assessment Patient has had a recent decline in their functional status and demonstrates the ability to make significant improvements in function in a reasonable and predictable amount of time.     Precautions / Restrictions Precautions Precautions: Fall Recall of Precautions/Restrictions: Intact Precaution/Restrictions Comments: L BKA residual limb protector and wound vax Restrictions Weight  Bearing Restrictions Per Provider Order: Yes LLE Weight Bearing Per Provider Order: Non weight bearing      Mobility  Bed Mobility Overal bed mobility: Needs Assistance Bed Mobility: Sit to Supine, Supine to Sit     Supine to sit: Contact guard, HOB elevated, Used rails Sit to supine: Contact guard assist, HOB elevated, Used rails   General bed mobility comments: Heavy use of railings for supine to sit    Transfers Overall transfer level: Needs assistance Equipment used: None               General transfer comment: Lateral scoots along EOB completed with CGA          Balance Overall balance assessment: Needs assistance Sitting-balance support: No upper extremity supported, Feet supported Sitting balance-Leahy Scale: Good                                       Pertinent Vitals/Pain Pain Assessment Pain Assessment: 0-10 Pain Score: 8  Pain Location: LLE Pain Descriptors / Indicators: Discomfort, Grimacing, Guarding Pain Intervention(s): Limited activity within patient's tolerance, Monitored during session, Repositioned    Home Living Family/patient expects to be discharged to:: Private residence Living Arrangements: Alone Available Help at Discharge: Family;Available 24 hours/day Type of Home: House Home Access: Ramped entrance       Home Layout: One level Home Equipment: Wheelchair - Forensic psychologist (2 wheels);Other (comment);Shower seat;BSC/3in1;Rollator (4 wheels) Additional Comments: mom lives across the street    Prior Function Prior Level of Function : Independent/Modified Independent             Mobility Comments:  works 5 days a week using cane, driving ADLs Comments: independent     Extremity/Trunk Assessment   Upper Extremity Assessment Upper Extremity Assessment: Defer to OT evaluation    Lower Extremity Assessment Lower Extremity Assessment: RLE deficits/detail;LLE deficits/detail RLE Deficits / Details: R  transmet amputation, hip, knee and ankle strength and ROM WFL LLE Deficits / Details: s/p 6/25 L BKA and 6/26 L BKA revision hip ROM and strength WFL LLE: Unable to fully assess due to pain    Cervical / Trunk Assessment Cervical / Trunk Assessment: Normal  Communication   Communication Communication: No apparent difficulties    Cognition Arousal: Alert Behavior During Therapy: WFL for tasks assessed/performed                             Following commands: Intact       Cueing Cueing Techniques: Verbal cues     General Comments General comments (skin integrity, edema, etc.): mother present throughout session, VSS on RA        Assessment/Plan    PT Assessment Patient needs continued PT services  PT Problem List Decreased strength;Decreased activity tolerance;Decreased balance;Decreased mobility;Cardiopulmonary status limiting activity;Decreased skin integrity;Pain       PT Treatment Interventions DME instruction;Gait training;Functional mobility training;Therapeutic activities;Therapeutic exercise;Balance training;Cognitive remediation;Patient/family education;Wheelchair mobility training    PT Goals (Current goals can be found in the Care Plan section)  Acute Rehab PT Goals PT Goal Formulation: With patient/family Time For Goal Achievement: 11/07/23 Potential to Achieve Goals: Good    Frequency Min 3X/week     Co-evaluation PT/OT/SLP Co-Evaluation/Treatment: Yes Reason for Co-Treatment: For patient/therapist safety;To address functional/ADL transfers PT goals addressed during session: Mobility/safety with mobility OT goals addressed during session: ADL's and self-care       AM-PAC PT 6 Clicks Mobility  Outcome Measure Help needed turning from your back to your side while in a flat bed without using bedrails?: None Help needed moving from lying on your back to sitting on the side of a flat bed without using bedrails?: A Little Help needed moving  to and from a bed to a chair (including a wheelchair)?: A Little Help needed standing up from a chair using your arms (e.g., wheelchair or bedside chair)?: Total Help needed to walk in hospital room?: Total Help needed climbing 3-5 steps with a railing? : Total 6 Click Score: 13    End of Session   Activity Tolerance: Patient limited by pain Patient left: in bed;with call bell/phone within reach;with bed alarm set;with family/visitor present Nurse Communication: Mobility status PT Visit Diagnosis: Unsteadiness on feet (R26.81);Other abnormalities of gait and mobility (R26.89);Muscle weakness (generalized) (M62.81);Difficulty in walking, not elsewhere classified (R26.2)    Time: 9140-9074 PT Time Calculation (min) (ACUTE ONLY): 26 min   Charges:   PT Evaluation $PT Eval Moderate Complexity: 1 Mod   PT General Charges $$ ACUTE PT VISIT: 1 Visit         Fransisco Messmer B. Fleeta Lapidus PT, DPT Acute Rehabilitation Services Please use secure chat or  Call Office [phone]   Almarie KATHEE Fleeta [facility] 10/24/2023, 1:09 PM

## 2023-10-24 NOTE — Progress Notes (Signed)
 Pharmacy Antibiotic Note  Kayla Schmitt is a 58 y.o. female admitted on 10/20/2023 with MRSA bacteremia.  Pharmacy has been consulted for Daptomycin dosing.  CK this AM was elevated at 522 however likely related to recent surgical procedures on 6/24 + 6/25. Will monitor closely to ensure trends down with plans to hold Daptomycin if >1000 with symptoms or >2000 without symptoms. Will recheck with AM labs on Monday.  Plan: - Continue Daptomycin 700 mg IV every 24 hours - Recheck CK on Monday - Will monitor plans for dispo and LOT  Height: 5' 4 (162.6 cm) Weight: 100.6 kg (221 lb 12.5 oz) IBW/kg (Calculated) : 54.7  Temp (24hrs), Avg:98 F (36.7 C), Min:97.8 F (36.6 C), Max:98.2 F (36.8 C)  Recent Labs  Lab 10/20/23 2200 10/20/23 2202 10/21/23 0108 10/21/23 0730 10/21/23 1230 10/22/23 0218 10/23/23 0435 10/24/23 0438  WBC 10.4  --   --  13.1*  --  11.4* 9.5 10.1  CREATININE 1.34*  --   --   --  1.25* 1.15* 1.07* 1.15*  LATICACIDVEN  --  1.2 1.1  --   --   --   --   --     Estimated Creatinine Clearance: 63 mL/min (A) (by C-G formula based on SCr of 1.15 mg/dL (H)).    Allergies  Allergen Reactions   Trental  [Pentoxifylline ] Nausea And Vomiting   Vibramycin [Doxycycline] Nausea And Vomiting   Penicillins Rash    Tolerated cephalosporins 07/2022     Antimicrobials this admission: Vancomycin  6/23 >> 6/25 Cefepime  6/23 >> 6/24 Daptomycin 6/25 >>  Dose adjustments this admission: Vancomycin  >> Daptomycin on 6/25  Microbiology results: 6/20 OSH >> 4/4 MRSA (results in care everywhere) 6/22 BCx >> ngx3d 6/25 tissue cx >> ng<24h  Thank you for allowing pharmacy to be a part of this patient's care.  Almarie Lunger, PharmD, BCPS, BCIDP Infectious Diseases Clinical Pharmacist 10/24/2023 8:15 AM   **Pharmacist phone directory can now be found on [email or web address] ([email or web address]).  Listed under [facility].

## 2023-10-24 NOTE — Progress Notes (Signed)
   10/24/23 1145  Spiritual Encounters  Type of Visit Follow up  Care provided to: Pt and family;Patient  Referral source Clinical staff  Reason for visit Routine spiritual support  OnCall Visit No  Spiritual Framework  Presenting Themes Values and beliefs;Impactful experiences and emotions  Community/Connection Family  Interventions  Spiritual Care Interventions Made Prayer;Established relationship of care and support;Reconciliation with self/others  Intervention Outcomes  Outcomes Awareness of support;Awareness of health;Reduced anxiety;Reduced isolation;Patient family open to resources   Chaplain follow up- Post surgery visit  Chaplain flow up with the Pt. After surgery. The Pt. Was in good spirits and requested prayer. Chaplain prayed with the Pt and her mother at the bedside. The Pt expressed gratitude for the follow -up visited and thanked the chaplain for coming by to see her.

## 2023-10-24 NOTE — TOC Initial Note (Signed)
 Transition of Care [facility]) - Initial/Assessment Note    Patient Details  Name: Kayla Schmitt MRN: [id number] Date of Birth: 28-Jun-1965  [facility]) CM/SW Contact:    Sudie Erminio Deems, RN Phone Number: 10/24/2023, 3:59 PM  Clinical Narrative:  Patient presented for left leg pain, lethargy, and fever. PTA patient was from home alone. Patient has support of her mother. Patient has DME hx of: wheelchair, bedside commode, and slide board. Patient post L BKA revision. PT/OT recommendations are for CIR. Inpatient Rehab Coordinator should follow post op therapy session. Case Manager will continue to follow for additional needs.     Expected Discharge Plan: IP Rehab Facility Barriers to Discharge: Continued Medical Work up   Expected Discharge Plan and Services In-house Referral: NA Discharge Planning Services: CM Consult   Living arrangements for the past 2 months: Single Family Home                   DME Agency: NA  Prior Living Arrangements/Services Living arrangements for the past 2 months: Single Family Home Lives with:: Self Patient language and need for interpreter reviewed:: Yes Do you feel safe going back to the place where you live?: Yes      Need for Family Participation in Patient Care: No (Comment) Care giver support system in place?: No (comment) Current home services: DME (wheelchair, bedside commode, slide board) Criminal Activity/Legal Involvement Pertinent to Current Situation/Hospitalization: No - Comment as needed  Activities of Daily Living   ADL Screening (condition at time of admission) Independently performs ADLs?: Yes (appropriate for developmental age) Is the patient deaf or have difficulty hearing?: No Does the patient have difficulty seeing, even when wearing glasses/contacts?: No Does the patient have difficulty concentrating, remembering, or making decisions?: No  Permission Sought/Granted Permission sought to share  information with : Family Supports, Case Manager   Emotional Assessment Appearance:: Appears stated age     Orientation: : Oriented to Self, Oriented to Place Alcohol / Substance Use: Not Applicable Psych Involvement: No (comment)  Admission diagnosis:  Sepsis (HCC) [A41.9] Patient Active Problem List   Diagnosis Date Noted   Necrotizing fasciitis (HCC) 10/22/2023   Gangrene of left foot (HCC) 10/21/2023   MRSA bacteremia 10/21/2023   Sepsis (HCC) 10/20/2023   AKI (acute kidney injury) (HCC) 10/20/2023   Diffuse pain in left lower extremity 10/20/2023   History of GI bleed 10/20/2023   Elevated troponin 10/20/2023   Hypokalemia 10/20/2023   History of coronary artery disease 10/20/2023   Prolonged QT interval 10/20/2023   AVM (arteriovenous malformation) of small bowel, acquired 04/07/2023   Antiplatelet or antithrombotic long-term use 04/04/2023   Iron deficiency anemia 04/04/2023   Acute on chronic combined systolic (congestive) and diastolic (congestive) heart failure (HCC) 04/03/2023   Osteomyelitis of foot, right, acute (HCC) 12/12/2022   CAD (coronary artery disease) 11/06/2022   Aortic stenosis 11/06/2022   Mitral regurgitation 08/27/2022   NSTEMI (non-ST elevated myocardial infarction) (HCC) 08/18/2022   Acute clinical systolic heart failure (HCC) 08/18/2022   Acute blood loss anemia 08/18/2022   Acute on chronic anemia 08/18/2022   Critical limb ischemia of both lower extremities (HCC) 06/19/2022   Insulin  dependent type 2 diabetes mellitus (HCC) 01/28/2020   Hyperlipidemia 01/28/2020   Hypertension 01/28/2020   Hypothyroidism 01/28/2020   Obesity 01/28/2020   Ulcerated, foot, right, limited to breakdown of skin (HCC)    Cellulitis of right foot    PVD (peripheral vascular disease) (HCC)  Cellulitis 01/11/2020   Blister of leg 01/06/2020   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/28/2017   Chest pain 10/28/2017   GERD (gastroesophageal reflux disease)  10/28/2017   PCP:  [name], NP Pharmacy:   [facility] - [place], [place] - [address] [place] [address] Phone: [phone] Fax: [phone]  [facility] [address] [place] [address] Phone: [phone] Fax: [phone]     Social Drivers of Health (SDOH) Social History: SDOH Screenings   Food Insecurity: No Food Insecurity (10/20/2023)  Housing: Low Risk  (10/20/2023)  Transportation Needs: No Transportation Needs (10/20/2023)  Utilities: Not At Risk (10/20/2023)  Financial Resource Strain: Low Risk  (10/19/2023)   Received from [facility]  Tobacco Use: Medium Risk (10/23/2023)   SDOH Interventions:     Readmission Risk Interventions    08/14/2022    4:37 PM  Readmission Risk Prevention Plan  Post Dischage Appt Complete  Medication Screening Complete  Transportation Screening Complete

## 2023-10-24 NOTE — Transfer of Care (Signed)
 Immediate Anesthesia Transfer of Care Note  Patient: Kayla Schmitt  Procedure(s) Performed: TRANSESOPHAGEAL ECHOCARDIOGRAM  Patient Location: PACU and Cath Lab  Anesthesia Type:MAC  Level of Consciousness: awake and sedated  Airway & Oxygen  Therapy: Patient Spontanous Breathing and Patient connected to face mask oxygen   Post-op Assessment: Report given to RN and Post -op Vital signs reviewed and stable  Post vital signs: Reviewed and stable  Last Vitals:  Vitals Value Taken Time  BP    Temp    Pulse    Resp    SpO2      Last Pain:  Vitals:   10/24/23 1003  TempSrc:   PainSc: 9          Complications: No notable events documented.

## 2023-10-24 NOTE — Progress Notes (Signed)
  Echocardiogram Echocardiogram Transesophageal has been performed.  [name] 10/24/2023, 12:59 PM

## 2023-10-24 NOTE — CV Procedure (Signed)
    TRANSESOPHAGEAL ECHOCARDIOGRAM   NAME:  Kayla Schmitt    MRN: [id number] DOB:  July 30, 1965    ADMIT DATE: 10/20/2023  INDICATIONS: Necrotizing fasciitis/MRSA bacteremia/aortic valve disease  PROCEDURE:   Informed consent was obtained prior to the procedure. The risks, benefits and alternatives for the procedure were discussed and the patient comprehended these risks.  Risks include, but are not limited to, cough, sore throat, vomiting, nausea, somnolence, esophageal and stomach trauma or perforation, bleeding, low blood pressure, aspiration, pneumonia, infection, trauma to the teeth and death.    Procedural time out performed.   Anesthesia was administered by anesthesia team (see their records).  The transesophageal probe was inserted in the esophagus and stomach without difficulty and multiple views were obtained.   COMPLICATIONS:    There were no immediate complications.  KEY FINDINGS:  LVEF 60-65%. No obvious valvular vegetation present (preliminary) will review the images closely off-line and final report forthcoming. Findings discussed with patient and mom over the phone.  Further management per primary team.   Madonna Large, DO, [facility]  A Division of [facility] [address], [place], [place] [address]  [place], [place] [address] Pager: [phone] Office: [phone] 10/24/23 2:00 PM

## 2023-10-24 NOTE — Interval H&P Note (Signed)
 History and Physical Interval Note:  10/24/2023 12:06 PM  Kayla Schmitt  has presented today for surgery, with the diagnosis of Bacteremia.  The various methods of treatment have been discussed with the patient and family. After consideration of risks, benefits and other options for treatment, the patient has consented to  Procedure(s): TRANSESOPHAGEAL ECHOCARDIOGRAM (N/A) as a surgical intervention.  The patient's history has been reviewed, patient examined, no change in status, stable for surgery.  I have reviewed the patient's chart and labs.  Questions were answered to the patient's satisfaction.    Informed Consent   Shared Decision Making/Informed Consent   The risks [esophageal damage, perforation (1:10,000 risk), bleeding, pharyngeal hematoma as well as other potential complications associated with conscious sedation including aspiration, arrhythmia, respiratory failure and death], benefits (treatment guidance and diagnostic support) and alternatives of a transesophageal echocardiogram were discussed in detail with [name] and she is willing to proceed.      Evalina Tabak, DO, FACC 12:06 PM

## 2023-10-24 NOTE — Evaluation (Signed)
 Occupational Therapy Evaluation Patient Details Name: Kayla Schmitt MRN: [id number] DOB: [birth year] Today's Date: 10/24/2023   History of Present Illness   Pt is a 58 y.o female admitted 6/22 for lethargy & LLE pain. Sepsis protocol initiated. Negative for osteomyelitis & DVT. S/p L BKA.  PMH: CAD, aortic stenosis, HTN, CHF, PAD s/p bilateral stent placement, bil metatarsal amputation, HLD, hypothyroidsim.     Clinical Impressions Pt admitted based on above, and was seen based on problem list below. PTA pt was independent with ADLs and IADLs. Today pt is requiring set up  to mod assist for ADLs. Bed mobility and lateral scoots are CGA. Pt is highly motivated to return to PLOF and active lifestyle including working. Pt with great rehab potential and anticipate she would do well with >3 hours of skilled rehab daily. OT will continue to follow acutely to maximize functional independence.     If plan is discharge home, recommend the following:   A lot of help with walking and/or transfers;A lot of help with bathing/dressing/bathroom     Functional Status Assessment   Patient has had a recent decline in their functional status and demonstrates the ability to make significant improvements in function in a reasonable and predictable amount of time.     Equipment Recommendations   Other (comment) (Defer to next venue)     Recommendations for Other Services   Rehab consult     Precautions/Restrictions   Precautions Precautions: Fall Recall of Precautions/Restrictions: Intact Restrictions Weight Bearing Restrictions Per Provider Order: Yes LLE Weight Bearing Per Provider Order: Non weight bearing     Mobility Bed Mobility Overal bed mobility: Needs Assistance Bed Mobility: Sit to Supine, Supine to Sit     Supine to sit: Contact guard, HOB elevated, Used rails Sit to supine: Contact guard assist, HOB elevated, Used rails   General bed mobility comments: Heavy  use of railings for supine to sit    Transfers Overall transfer level: Needs assistance Equipment used: None   General transfer comment: Lateral scoots along EOB completed with CGA      Balance Overall balance assessment: Needs assistance Sitting-balance support: No upper extremity supported, Feet supported Sitting balance-Leahy Scale: Good       ADL either performed or assessed with clinical judgement   ADL Overall ADL's : Needs assistance/impaired Eating/Feeding: Set up;Sitting   Grooming: Wash/dry face;Oral care;Set up;Sitting Grooming Details (indicate cue type and reason): Sitting EOB Upper Body Bathing: Set up;Sitting   Lower Body Bathing: Moderate assistance;Sitting/lateral leans Lower Body Bathing Details (indicate cue type and reason): Able to reach BLEs while seated EOB Upper Body Dressing : Set up;Sitting   Lower Body Dressing: Moderate assistance;Sitting/lateral leans Lower Body Dressing Details (indicate cue type and reason): Able to reach BLEs while seated EOB, would need assist to pull above waist             Functional mobility during ADLs: Contact guard assist (For lateral scoots) General ADL Comments: Completing ADLs EOB, good dynamic sitting balance, ability to wt shift for LB ADLs     Vision Baseline Vision/History: 0 No visual deficits Vision Assessment?: No apparent visual deficits            Pertinent Vitals/Pain Pain Assessment Pain Assessment: 0-10 Pain Score: 8  Pain Location: LLE Pain Descriptors / Indicators: Discomfort, Grimacing, Guarding Pain Intervention(s): Limited activity within patient's tolerance     Extremity/Trunk Assessment Upper Extremity Assessment Upper Extremity Assessment: Overall WFL for tasks assessed   Lower  Extremity Assessment Lower Extremity Assessment: Defer to PT evaluation   Cervical / Trunk Assessment Cervical / Trunk Assessment: Normal   Communication Communication Communication: No apparent  difficulties   Cognition Arousal: Alert Behavior During Therapy: WFL for tasks assessed/performed Cognition: No apparent impairments   Following commands: Intact       Cueing  General Comments   Cueing Techniques: Verbal cues  mother present for session and supportive           Home Living Family/patient expects to be discharged to:: Private residence Living Arrangements: Alone Available Help at Discharge: Family;Available 24 hours/day Type of Home: House Home Access: Ramped entrance     Home Layout: One level     Bathroom Shower/Tub: Walk-in shower (6 lip)   Bathroom Toilet: Handicapped height Bathroom Accessibility: Yes   Home Equipment: Wheelchair - Forensic psychologist (2 wheels);Other (comment);Shower seat;BSC/3in1;Rollator (4 wheels)   Additional Comments: mom lives across the street      Prior Functioning/Environment Prior Level of Function : Independent/Modified Independent             Mobility Comments: works 5 days a week using cane, driving ADLs Comments: independent    OT Problem List: Decreased strength;Decreased range of motion;Decreased activity tolerance;Impaired balance (sitting and/or standing);Decreased safety awareness;Decreased knowledge of use of DME or AE;Cardiopulmonary status limiting activity   OT Treatment/Interventions: Self-care/ADL training;Therapeutic exercise;Energy conservation;DME and/or AE instruction;Therapeutic activities;Patient/family education;Balance training      OT Goals(Current goals can be found in the care plan section)   Acute Rehab OT Goals Patient Stated Goal: To return to PLOF OT Goal Formulation: With patient Time For Goal Achievement: 11/07/23 Potential to Achieve Goals: Good   OT Frequency:  Min 2X/week    Co-evaluation PT/OT/SLP Co-Evaluation/Treatment: Yes Reason for Co-Treatment: For patient/therapist safety;To address functional/ADL transfers   OT goals addressed during session: ADL's  and self-care      AM-PAC OT 6 Clicks Daily Activity     Outcome Measure Help from another person eating meals?: None Help from another person taking care of personal grooming?: A Little Help from another person toileting, which includes using toliet, bedpan, or urinal?: A Lot Help from another person bathing (including washing, rinsing, drying)?: A Lot Help from another person to put on and taking off regular upper body clothing?: A Little Help from another person to put on and taking off regular lower body clothing?: A Lot 6 Click Score: 16   End of Session Nurse Communication: Mobility status  Activity Tolerance: Patient limited by pain Patient left: in bed;with call bell/phone within reach;with family/visitor present  OT Visit Diagnosis: Unsteadiness on feet (R26.81);Other abnormalities of gait and mobility (R26.89);Muscle weakness (generalized) (M62.81)                Time: 9140-9074 OT Time Calculation (min): 26 min Charges:  OT General Charges $OT Visit: 1 Visit OT Evaluation $OT Eval Moderate Complexity: 1 Mod OT Treatments $Self Care/Home Management : 8-22 mins  Adrianne BROCKS, OT  Acute Rehabilitation Services Office [phone] Secure chat preferred   Adrianne GORMAN Savers 10/24/2023, 10:46 AM

## 2023-10-24 NOTE — Progress Notes (Signed)
 PROGRESS NOTE   Kayla Schmitt  FMW:[id number]    DOB: 1966/02/07    DOA: 10/20/2023  PCP: [name], NP   I have briefly reviewed patients previous medical records in [facility].   Brief Hospital Course:  58 y.o. 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  Sepsis protocol was initiated and she was started on vancomycin  and cefepime .  Blood cultures grew gram-positive cocci but no clear source of infection.  CT of chest abdomen and pelvis without clear abnormality.  Venous Doppler negative for DVT.  She was noted to have purple spotty area of discoloration of left leg.  She was then transferred to [facility] for vascular surgery consult. At [facility] patient has been treated for sepsis with endorgan dysfunction including elevated lactic acid and AKI, peripheral vascular disease with increased pain of the left lower extremity, AKI, altered mental status, DM type II and elevated troponin secondary to demand ischemia. Patient also received 1 unit of blood transfusion as hemoglobin dropped 8.2-7. No GI bleed at outside hospital.    Assessment & Plan:   Necrotizing fasciitis of left lower extremity/PAD  Vascular surgery and orthopedics were consulted She has history of bilateral transmetatarsal amputations and previous bilateral lower extremity endovascular revascularization. S/p left BKA with wound VAC placement on 6/24 by Dr. Fonda Rim S/p left revision BKA, on 6/25 by Dr. Harden  MRSA bacteremia Surgical intervention on left lower extremity as noted above. ID managing antimicrobial treatment Vancomycin  switched to daptomycin. TEE 6/26 preliminary  result without vegetations. Surveillance blood cultures x 2 from 6/22, negative to date/4 days. Surgical tissue 6/25: No growth to date.  Acute kidney injury Suspecting prerenal in the setting of sepsis and hypotension Resolved.  Avoid nephrotoxics and hypotension.  Elevated troponin secondary to demand ischemia/history of CAD Outside facility troponin, 696 > 576 > 532.  No anginal symptoms or acute EKG changes. TTE: LVEF 50-55%, grade 2 diastolic dysfunction. Was on IV heparin  drip for PAD indication which was discontinued on 6/24. Continue atorvastatin  and Plavix .  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 Hemoglobin has gradually drifted down to 7.9, likely related to multiple surgeries Patient received a unit of PRBC at outside hospital for hemoglobin of 7. Follow CBCs daily and transfuse for hemoglobin 7 g or less Hemoglobin 7.6 today.  Insulin -dependent type 2 DM Good inpatient control on current dose of Semglee  10 units at bedtime and NovoLog  SSI Periodic fluctuations.  Chronic combined systolic and diastolic CHF EF appears to have improved compared to prior.  Clinically euvolemic.  Hypertension Intermittent soft blood pressures Monitoring off of antihypertensives  Prolonged QT Minimize QT prolonging drugs Last EKG on 6/22 showed QTc of 486 ms.  Hypothyroidism Continue with levothyroxine .  Body mass index is 38.07 kg/m.   DVT prophylaxis: SCD's Start: 10/23/23 1248 SCDs Start: 10/20/23 2037     Code Status: Full Code:  Family Communication: Daughter at bedside Disposition:  Status is: Inpatient Remains inpatient appropriate because: IV antibiotics pending final culture results.   Consultants:   Vascular surgery Orthopedics Infectious disease Cardiology  Procedures:   As above  Subjective:  Patient seen this morning while  she was on her way to TEE.   Reported postop left lower extremity pain.  Objective:   Vitals:   10/24/23 1310 10/24/23 1315 10/24/23 1350 10/24/23 1628  BP: (!) 145/63 (!) 152/65 (!) 152/66 (!) 133/56  Pulse: 82 82 84   Resp: 16 16    Temp:   98.2 F (36.8 C)   TempSrc:   Oral Oral  SpO2: 97% 96% 98%   Weight:      Height:        General exam: Young female, moderately built and nourished lying comfortably propped up in bed without distress. Respiratory system: Clear to auscultation.  No increased work of breathing. Cardiovascular system: S1 & S2 heard, RRR. No JVD, murmurs, rubs, gallops or clicks. No right lower extremity pedal edema.  Telemetry personally reviewed: Sinus rhythm. Gastrointestinal system: Abdomen is nondistended, soft and nontender. No organomegaly or masses felt. Normal bowel sounds heard. Central nervous system: Alert and oriented. No focal neurological deficits. Extremities: Symmetric 5 x 5 power.  Healed right transmetatarsal amputation stump.  Left BKA stump site with wound VAC and postop dressing clean and dry-unchanged. Skin: No rashes, lesions or ulcers Psychiatry: Judgement and insight appear normal. Mood & affect appropriate.     Data Reviewed:   I have personally reviewed following labs and imaging studies   CBC: Recent Labs  Lab 10/22/23 0218 10/23/23 0435 10/24/23 0438  WBC 11.4* 9.5 10.1  HGB 8.3* 7.9* 7.6*  HCT 24.9* 24.8* 24.8*  MCV 87.1 90.5 92.9  PLT 229 260 268    Basic Metabolic Panel: Recent Labs  Lab 10/20/23 2200 10/21/23 1230 10/22/23 0218 10/23/23 0435 10/24/23 0438  NA 132* 134* 135 135 138  K 3.2* 4.0 3.5 4.1 3.5  CL 103 104 107 109 110  CO2 19* 21* 20* 22 21*  GLUCOSE 167* 170* 153* 187* 115*  BUN 26* 19 19 19 19   CREATININE 1.34* 1.25* 1.15* 1.07* 1.15*  CALCIUM  7.2* 7.5* 7.5* 7.3* 7.6*  MG  --  1.9  --   --   --     Liver Function Tests: Recent Labs  Lab 10/20/23 2200 10/22/23 0218 10/23/23 0435 10/24/23 0438  AST 81* 59* 35 50*   ALT 42 37 28 26  ALKPHOS 143* 170* 171* 176*  BILITOT 0.6 0.8 0.7 0.4  PROT 5.0* 5.0* 5.2* 5.3*  ALBUMIN <1.5* <1.5* <1.5* <1.5*    CBG: Recent Labs  Lab 10/24/23 0847 10/24/23 1354 10/24/23 1628  GLUCAP 233* 128* 116*    Microbiology Studies:   Recent Results (from the past 240 hours)  Culture, blood (Routine X 2) w Reflex to ID Panel     Status: None (Preliminary result)   Collection Time: 10/20/23 10:00 PM   Specimen: BLOOD LEFT HAND  Result Value Ref Range Status   Specimen Description BLOOD LEFT HAND  Final   Special Requests   Final    BOTTLES DRAWN AEROBIC AND ANAEROBIC Blood Culture adequate volume   Culture   Final    NO GROWTH 4 DAYS Performed at [facility], [address], [place], [place] [address]    Report Status PENDING  Incomplete  Culture, blood (Routine X 2) w Reflex to ID Panel     Status: None (Preliminary result)   Collection Time: 10/20/23 10:02 PM   Specimen: BLOOD LEFT ARM  Result Value Ref Range Status   Specimen Description BLOOD LEFT ARM  Final   Special Requests   Final  BOTTLES DRAWN AEROBIC AND ANAEROBIC Blood Culture adequate volume   Culture   Final    NO GROWTH 4 DAYS Performed at [facility], [address] [place], [place] [address]    Report Status PENDING  Incomplete  Surgical pcr screen     Status: None   Collection Time: 10/21/23  9:19 PM   Specimen: Nasal Mucosa; Nasal Swab  Result Value Ref Range Status   MRSA, PCR NEGATIVE NEGATIVE Final   Staphylococcus aureus NEGATIVE NEGATIVE Final    Comment: (NOTE) The Xpert SA Assay (FDA approved for NASAL specimens in patients 42 years of age and older), is one component of a comprehensive surveillance program. It is not intended to diagnose infection nor to guide or monitor treatment. Performed at [facility], [address] [place], [place] [address]   Aerobic/Anaerobic Culture w Gram Stain (surgical/deep wound)     Status: None (Preliminary result)    Collection Time: 10/23/23 11:39 AM   Specimen: PATH Soft tissue  Result Value Ref Range Status   Specimen Description TISSUE  Final   Special Requests A  Final   Gram Stain NO WBC SEEN NO ORGANISMS SEEN   Final   Culture   Final    NO GROWTH < 24 HOURS Performed at [facility], [address] [place], [place] [address]    Report Status PENDING  Incomplete    Radiology Studies:  ECHO TEE Result Date: 10/24/2023    TRANSESOPHOGEAL ECHO REPORT   Patient [facility]:   [facility] Date of Exam: 10/24/2023 Medical Rec #:  [id number]              Height:       64.0 in Accession #:    [id number]             Weight:       221.8 lb Date of Birth:  1966/01/08               BSA:          2.044 m Patient Age:    57 years               BP:           156/65 mmHg Patient Gender: F                      HR:           84 bpm. Exam Location:  Inpatient Procedure: Transesophageal Echo, Cardiac Doppler, Color Doppler and Saline            Contrast Bubble Study (Both Spectral and Color Flow Doppler were            utilized during procedure). Indications:     Endocarditis  History:         Patient has prior history of Echocardiogram examinations, most                  recent 10/21/2023. CAD; Risk Factors:Hypertension and                  Dyslipidemia.  Sonographer:     Philomena Daring Referring Phys:  [id number] TAYLOR A PARCELLS Diagnosing Phys: Madonna Large PROCEDURE: After discussion of the risks and benefits of a TEE, an informed consent was obtained from the patient. The transesophogeal probe was passed without difficulty through the esophogus of the patient. Sedation performed by different physician. The patient  was monitored while under deep sedation. Anesthestetic sedation was provided intravenously by Anesthesiology: 482mg  of Propofol , 60mg  of Lidocaine . The patient developed no complications during the procedure.  IMPRESSIONS  1. Left ventricular ejection fraction, by estimation, is 60 to 65%. The left  ventricle has normal function. The left ventricle has no regional wall motion abnormalities.  2. Right ventricular systolic function is normal. The right ventricular size is normal.  3. No left atrial/left atrial appendage thrombus was detected. The LAA emptying velocity was 66 cm/s.  4. The mitral valve is degenerative. Mild to moderate mitral valve regurgitation. No evidence of mitral stenosis.  5. Native valve, calcified, reduced leaflet excursion, mild to moderate aortic regurgitation, moderate to severe aortic stenosis (peak velocity 3.4 m/s, mean gradient 27 mmHg, aortic valve area per VTI 1.07 cm, dimensional index 0.26).  6. There is mild (Grade II) layered plaque involving the descending aorta.  7. Agitated saline contrast bubble study was negative, with no evidence of any interatrial shunt. Conclusion(s)/Recommendation(s): No evidence of vegetation/infective endocarditis on this transesophageael echocardiogram. FINDINGS  Left Ventricle: Left ventricular ejection fraction, by estimation, is 60 to 65%. The left ventricle has normal function. The left ventricle has no regional wall motion abnormalities. The left ventricular internal cavity size was normal in size. Right Ventricle: The right ventricular size is normal. Right vetricular wall thickness was not well visualized. Right ventricular systolic function is normal. Left Atrium: Left atrial size was normal in size. No left atrial/left atrial appendage thrombus was detected. The LAA emptying velocity was 66 cm/s. Right Atrium: Right atrial size was normal in size. Prominent Eustachian valve. Pericardium: There is no evidence of pericardial effusion. Mitral Valve: The mitral valve is degenerative in appearance. There is mild thickening of the mitral valve leaflet(s). Normal mobility of the mitral valve leaflets. Mild mitral annular calcification. Mild to moderate mitral valve regurgitation. No evidence of mitral valve stenosis. There is no evidence of  mitral valve vegetation. Tricuspid Valve: The tricuspid valve is grossly normal. Tricuspid valve regurgitation is trivial. No evidence of tricuspid stenosis. There is no evidence of tricuspid valve vegetation. Aortic Valve: Native valve, calcified, reduced leaflet excursion, mild to moderate aortic regurgitation, moderate to severe aortic stenosis (peak velocity 3.4 m/s, mean gradient 27 mmHg, aortic valve area per VTI 1.07 cm, dimensional index 0.26). Aortic  valve mean gradient measures 27.2 mmHg. Aortic valve peak gradient measures 46.3 mmHg. Aortic valve area, by VTI measures 1.07 cm. There is no evidence of aortic valve vegetation. Pulmonic Valve: The pulmonic valve was grossly normal. Pulmonic valve regurgitation is trivial. No evidence of pulmonic stenosis. There is no evidence of pulmonic valve vegetation. Aorta: The aortic root and ascending aorta are structurally normal, with no evidence of dilitation. There is mild (Grade II) layered plaque involving the descending aorta. Venous: The left lower pulmonary vein, left upper pulmonary vein, right upper pulmonary vein and right lower pulmonary vein are normal. IAS/Shunts: The interatrial septum appears to be lipomatous. The atrial septum is grossly normal. Agitated saline contrast was given intravenously to evaluate for intracardiac shunting. Agitated saline contrast bubble study was negative, with no evidence  of any interatrial shunt.  LEFT VENTRICLE PLAX 2D LVOT diam:     2.30 cm LV SV:         74 LV SV Index:   36 LVOT Area:     4.15 cm  AORTIC VALVE  PULMONIC VALVE AV Area (Vmax):    1.10 cm      RVOT Peak grad: 4 mmHg AV Area (Vmean):   1.16 cm AV Area (VTI):     1.07 cm AV Vmax:           340.25 cm/s AV Vmean:          243.250 cm/s AV VTI:            0.689 m AV Peak Grad:      46.3 mmHg AV Mean Grad:      27.2 mmHg LVOT Vmax:         89.90 cm/s LVOT Vmean:        68.100 cm/s LVOT VTI:          0.178 m LVOT/AV VTI ratio: 0.26   AORTA Ao Root diam: 3.10 cm Ao Asc diam:  3.50 cm  SHUNTS Systemic VTI:  0.18 m Systemic Diam: 2.30 cm Pulmonic VTI:  0.161 m Sunit Tolia Electronically signed by Madonna Large Signature Date/Time: 10/24/2023/1:56:25 PM    Final    EP STUDY Result Date: 10/24/2023 See surgical note for result.   Scheduled Meds:    atorvastatin   40 mg Oral Daily   clopidogrel   75 mg Oral Daily   docusate sodium   100 mg Oral Daily   enoxaparin  (LOVENOX ) injection  50 mg Subcutaneous Daily   ferrous sulfate   325 mg Oral Q breakfast   gabapentin   100 mg Oral TID   insulin  aspart  0-5 Units Subcutaneous QHS   insulin  aspart  0-6 Units Subcutaneous TID WC   insulin  glargine-yfgn  10 Units Subcutaneous QHS   levothyroxine   150 mcg Oral QAC breakfast   pantoprazole   40 mg Oral BID   sodium chloride  flush  10-40 mL Intracatheter Q12H    Continuous Infusions:    DAPTOmycin Stopped (10/24/23 1443)   magnesium  sulfate bolus IVPB       LOS: 4 days     Trenda Mar, MD,  FACP, [facility], [facility], [facility]   [facility] [place]      To contact the attending provider between 7A-7P or the covering provider during after hours 7P-7A, please log into the web site [email or web address] and access using universal [place] password for that web site. If you do not have the password, please call the hospital operator.  10/24/2023, 5:24 PM

## 2023-10-24 NOTE — Progress Notes (Signed)
 [facility]  Date of Admission:  10/20/2023     Reason for Follow Up: Necrotizing fasciitis (HCC)  Total days of antibiotics 4         ASSESSMENT:  Kayla Schmitt is postop day #1 from revision of left below-knee amputation in the setting of necrotizing fasciitis and MRSA bacteremia.  Surgical specimens with no organisms on Gram stain and cultures pending.  Blood cultures from 10/20/2023 remain without growth to date.  Await TEE duration of treatment which is scheduled for today.  Discussed plan of care to you current dose of daptomycin.  CK level elevated at 522 will continue therapeutic drug monitoring.  Postoperative wound care per orthopedics with no additional surgical interventions planned at this time.  Monitor cultures for clearance of bacteremia.  Continue to optimize protein for healing.  Contact precautions for MRSA.  Remaining medical and supportive care per internal medicine.  PLAN:  Continue current dose of daptomycin. Therapeutic drug monitoring of CK levels. Monitor blood cultures for clearance of bacteremia. Postoperative wound care per orthopedics. Await TEE results to determine presence of endocarditis. Optimize protein intake for healing. Contact precautions for MRSA. Remaining medical and supportive care per internal medicine.  Principal Problem:   Necrotizing fasciitis (HCC) Active Problems:   MRSA bacteremia   PVD (peripheral vascular disease) (HCC)   Insulin  dependent type 2 diabetes mellitus (HCC)   Hypothyroidism   Acute on chronic anemia   Sepsis (HCC)   AKI (acute kidney injury) (HCC)   Diffuse pain in left lower extremity   History of GI bleed   Elevated troponin   Hypokalemia   History of coronary artery disease   Prolonged QT interval   Gangrene of left foot (HCC)    atorvastatin   40 mg Oral Daily   clopidogrel   75 mg Oral Daily   docusate sodium   100 mg Oral Daily   enoxaparin  (LOVENOX ) injection  50 mg  Subcutaneous Daily   ferrous sulfate   325 mg Oral Q breakfast   gabapentin   100 mg Oral TID   insulin  aspart  0-5 Units Subcutaneous QHS   insulin  aspart  0-6 Units Subcutaneous TID WC   insulin  glargine-yfgn  10 Units Subcutaneous QHS   levothyroxine   150 mcg Oral QAC breakfast   pantoprazole   40 mg Oral BID   potassium chloride   40 mEq Oral Once   sodium chloride  flush  10-40 mL Intracatheter Q12H   sodium chloride  flush  3-10 mL Intravenous Q12H    SUBJECTIVE:  Afebrile overnight with no acute events.  Tolerating antibiotics with no adverse side effects.  Continues to have left leg pain.  Mother at bedside  Allergies  Allergen Reactions   Trental  [Pentoxifylline ] Nausea And Vomiting   Vibramycin [Doxycycline] Nausea And Vomiting   Penicillins Rash    Tolerated cephalosporins 07/2022      Review of Systems: Review of Systems  Constitutional:  Negative for chills, fever and weight loss.  Respiratory:  Negative for cough, shortness of breath and wheezing.   Cardiovascular:  Negative for chest pain and leg swelling.  Gastrointestinal:  Negative for abdominal pain, constipation, diarrhea, nausea and vomiting.  Musculoskeletal:        Left leg surgical pain  Skin:  Negative for rash.      OBJECTIVE: Vitals:   10/23/23 1247 10/23/23 2005 10/24/23 0439 10/24/23 0850  BP: 125/60 (!) 109/53 (!) 134/54   Pulse: 66 73 73   Resp: 16 18 16  Temp:  98.1 F (36.7 C) 98.1 F (36.7 C) 98.4 F (36.9 C)  TempSrc:  Oral Oral Oral  SpO2: 100% 98% 97%   Weight:      Height:       Body mass index is 38.07 kg/m.  Physical Exam Constitutional:      General: She is not in acute distress.    Appearance: She is well-developed.   Cardiovascular:     Rate and Rhythm: Normal rate and regular rhythm.     Heart sounds: Normal heart sounds.  Pulmonary:     Effort: Pulmonary effort is normal.     Breath sounds: Normal breath sounds.   Musculoskeletal:     Comments: Surgical  dressing in place with wound VAC and no drainage in catheter.   Skin:    General: Skin is warm and dry.   Neurological:     Mental Status: She is alert and oriented to person, place, and time.   Psychiatric:        Mood and Affect: Mood normal.     Lab Results Lab Results  Component Value Date   WBC 10.1 10/24/2023   HGB 7.6 (L) 10/24/2023   HCT 24.8 (L) 10/24/2023   MCV 92.9 10/24/2023   PLT 268 10/24/2023    Lab Results  Component Value Date   CREATININE 1.15 (H) 10/24/2023   BUN 19 10/24/2023   NA 138 10/24/2023   K 3.5 10/24/2023   CL 110 10/24/2023   CO2 21 (L) 10/24/2023    Lab Results  Component Value Date   ALT 26 10/24/2023   AST 50 (H) 10/24/2023   ALKPHOS 176 (H) 10/24/2023   BILITOT 0.4 10/24/2023     Microbiology: Recent Results (from the past 240 hours)  Culture, blood (Routine X 2) w Reflex to ID Panel     Status: None (Preliminary result)   Collection Time: 10/20/23 10:00 PM   Specimen: BLOOD LEFT HAND  Result Value Ref Range Status   Specimen Description BLOOD LEFT HAND  Final   Special Requests   Final    BOTTLES DRAWN AEROBIC AND ANAEROBIC Blood Culture adequate volume   Culture   Final    NO GROWTH 4 DAYS Performed at [facility], [address], [place], [place] [address]    Report Status PENDING  Incomplete  Culture, blood (Routine X 2) w Reflex to ID Panel     Status: None (Preliminary result)   Collection Time: 10/20/23 10:02 PM   Specimen: BLOOD LEFT ARM  Result Value Ref Range Status   Specimen Description BLOOD LEFT ARM  Final   Special Requests   Final    BOTTLES DRAWN AEROBIC AND ANAEROBIC Blood Culture adequate volume   Culture   Final    NO GROWTH 4 DAYS Performed at [facility], [address], [place], [place] [address]    Report Status PENDING  Incomplete  Surgical pcr screen     Status: None   Collection Time: 10/21/23  9:19 PM   Specimen: Nasal Mucosa; Nasal Swab  Result Value Ref Range Status    MRSA, PCR NEGATIVE NEGATIVE Final   Staphylococcus aureus NEGATIVE NEGATIVE Final    Comment: (NOTE) The Xpert SA Assay (FDA approved for NASAL specimens in patients 35 years of age and older), is one component of a comprehensive surveillance program. It is not intended to diagnose infection nor to guide or monitor treatment. Performed at [facility], [address], [place], [place] [address]   Aerobic/Anaerobic Culture w Gram Stain (surgical/deep wound)     Status: None (Preliminary result)   Collection Time: 10/23/23 11:39 AM   Specimen: PATH Soft tissue  Result Value Ref Range Status   Specimen Description TISSUE  Final   Special Requests A  Final   Gram Stain NO WBC SEEN NO ORGANISMS SEEN   Final   Culture   Final    NO GROWTH < 24 HOURS Performed at [facility], [address], [place], [place] [address]    Report Status PENDING  Incomplete    I have personally spent 30 minutes involved in face-to-face and non-face-to-face activities for this patient on the day of the visit. Professional time spent includes the following activities: preparing to see the patient (review of tests), performing a medically appropriate examination, ordering medications, communicating with other health care professionals, documenting clinical information in the EMR, communicating results and counseling patient and family regarding medication and plan of care, and care coordination.    Greg Barbie Croston, NP [facility]  10/24/2023  11:28 AM

## 2023-10-25 ENCOUNTER — Other Ambulatory Visit: Payer: Self-pay

## 2023-10-25 DIAGNOSIS — R7881 Bacteremia: Secondary | ICD-10-CM | POA: Diagnosis not present

## 2023-10-25 DIAGNOSIS — Z89512 Acquired absence of left leg below knee: Secondary | ICD-10-CM

## 2023-10-25 DIAGNOSIS — M726 Necrotizing fasciitis: Secondary | ICD-10-CM | POA: Diagnosis not present

## 2023-10-25 DIAGNOSIS — Z4889 Encounter for other specified surgical aftercare: Secondary | ICD-10-CM

## 2023-10-25 DIAGNOSIS — B9562 Methicillin resistant Staphylococcus aureus infection as the cause of diseases classified elsewhere: Secondary | ICD-10-CM | POA: Diagnosis not present

## 2023-10-25 DIAGNOSIS — Z89522 Acquired absence of left knee: Secondary | ICD-10-CM | POA: Diagnosis not present

## 2023-10-25 LAB — CBC
HCT: 24.8 % — ABNORMAL LOW (ref 36.0–46.0)
Hemoglobin: 7.6 g/dL — ABNORMAL LOW (ref 12.0–15.0)
MCH: 28.4 pg (ref 26.0–34.0)
MCHC: 30.6 g/dL (ref 30.0–36.0)
MCV: 92.5 fL (ref 80.0–100.0)
Platelets: 287 10*3/uL (ref 150–400)
RBC: 2.68 MIL/uL — ABNORMAL LOW (ref 3.87–5.11)
RDW: 15.2 % (ref 11.5–15.5)
WBC: 10.1 10*3/uL (ref 4.0–10.5)
nRBC: 0 % (ref 0.0–0.2)

## 2023-10-25 LAB — CULTURE, BLOOD (ROUTINE X 2)
Culture: NO GROWTH
Culture: NO GROWTH
Special Requests: ADEQUATE
Special Requests: ADEQUATE

## 2023-10-25 LAB — BASIC METABOLIC PANEL WITH GFR
Anion gap: 8 (ref 5–15)
BUN: 13 mg/dL (ref 6–20)
CO2: 23 mmol/L (ref 22–32)
Calcium: 7.8 mg/dL — ABNORMAL LOW (ref 8.9–10.3)
Chloride: 108 mmol/L (ref 98–111)
Creatinine, Ser: 0.99 mg/dL (ref 0.44–1.00)
GFR, Estimated: >60 mL/min (ref >60)
Glucose, Bld: 102 mg/dL — ABNORMAL HIGH (ref 70–99)
Potassium: 3.9 mmol/L (ref 3.5–5.1)
Sodium: 139 mmol/L (ref 135–145)

## 2023-10-25 LAB — GLUCOSE, CAPILLARY
Glucose-Capillary: 114 mg/dL — ABNORMAL HIGH (ref 70–99)
Glucose-Capillary: 144 mg/dL — ABNORMAL HIGH (ref 70–99)
Glucose-Capillary: 149 mg/dL — ABNORMAL HIGH (ref 70–99)
Glucose-Capillary: 138 mg/dL — ABNORMAL HIGH (ref 70–99)

## 2023-10-25 LAB — MAGNESIUM: Magnesium: 2.2 mg/dL (ref 1.7–2.4)

## 2023-10-25 MED ORDER — NYSTATIN 100000 UNIT/GM EX POWD
Freq: Two times a day (BID) | CUTANEOUS | Status: DC
  Filled 2023-10-25: qty 15

## 2023-10-25 NOTE — Progress Notes (Signed)
 [name]      Patient ID: Kayla Schmitt, female   DOB: 11-09-1965, 58 y.o.   MRN: [id number] Patient is postoperative day 1 revision below-knee amputation for necrotizing fasciitis.  Patient had necrotic muscle fascia and skin along the wound margins.  This was resected as well as the distal 2 cm of bone.  Cultures are pending from soft tissue.  Anticipate discharge to inpatient versus outpatient rehab.     A/P: Necrotizing Fascitis left LE S/p left BKA with wound VAC placement on 6/24 by Dr. Fonda Rim S/p left revision BKA, on 6/25 by Dr. Duda Vac will be maintained for 1 week until F/U.  MRSA bacteremia  ID managing antimicrobial treatment Vancomycin  switched to daptomycin . TEE 6/26 preliminary result without vegetations. Surveillance blood cultures x 2 from 6/22, negative to date/4 days. Surgical tissue 6/25: No growth to date.  Pending rehab in patient verse out patient will be in the hospital until early next week.

## 2023-10-25 NOTE — PMR Pre-admission (Signed)
 PMR Admission Coordinator Pre-Admission Assessment  Patient: Kayla Schmitt is an 58 y.o., female MRN: [id number] DOB: Feb 01, 1966 Height: 5' 4 (162.6 cm) Weight: 104.8 kg  Insurance Information HMO: yes    PPO:      PCP:      IPA:      80/20:      OTHER:  PRIMARY: [employer] Managed      Policy#: [id number]      Subscriber: patient CM Name: Kayla Schmitt      Phone#: [phone] ext [phone]     Fax#: [phone]  Pre-Cert#: [id number]  aaproved 7/1 until 7/1 f/u with Rudolph Kanaris phone [phone] ext [phone] fax [phone]     Employer:  Benefits:  Phone #: [phone]     Name:  Eff. Date: 01/28/22-04/29/24     Deduct: $3,500 ($2,047.62 met)      Out of Pocket Max: $7,000 ($2,296.29 met)      Life Max: NA CIR: $250 co-pay/admission, then 70% coverage, 30% co-insurance      SNF: 70% coverage, 30% co-insurance Outpatient: 70% coverage     Co-Pay: 30% co-insurance Home Health: 70% coverage      Co-Pay: 30% co-insurance DME: 70%  coverage     Co-Pay: 30% co-insruance Providers: in-network SECONDARY:       Policy#:      Phone#:   Artist:       Phone#:   The Data processing manager" for patients in Inpatient Rehabilitation Facilities with attached "Privacy Act Statement-Health Care Records" was provided and verbally reviewed with: Patient  Emergency Contact Information Contact Information     Name Relation Home Work Mobile   [name],[name] Mother [phone]  [phone]      Other Contacts   None on File    Current Medical History  Patient Admitting Diagnosis: Left BKA  History of Present Illness: Pt is a 58 year old female with medical hx significant for: multivessel CAD, aortic stenosis, essential HTN, heart failure with reduced ejection fraction less than 40-45%, h/o PAD s/p bilateral stent placement and s/p bilateral metatarsal amputation, chronic anemia, hyperlipidemia, hypothyroidism, h/o GI bleed secondary to AVM. Pt initially  presented to [facility] for lethargy. Pt also found to be febrile and hypotensive. Sepsis protocol initiated. Blood cultures growing gram-positive cocci. Pt also c/o left leg pain. CT chest/abdomen/pelvis and MRI stump negative for abnormalities. Doppler ultrasound negative for DVT. Noted purple spotty area of discoloration in left leg. ABI of LLE showed loss of tibial waveform. Pt received 1 unit blood transfusion d/t Hgb of 7. Pt transferred to [facility] on 10/20/23.   Pt received 1 unit of blood on 6/22. Orthopedics consulted. Determined pt at risk for necrotizing fasciitis. Vascular Surgery consulted. Recommended amputation. 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 Lanis on 10/22/23; left open for revision.  Pt underwent revision of left BKA by Dr. Harden on 10/23/23. Pt underwent TEE by Dr. Michele on 10/24/23. Showed EF 60-65%. Therapy evaluations completed and CIR recommended d/t functional mobility deficits.  Patient's medical record from [facility] has been reviewed by the rehabilitation admission coordinator and physician.  Past Medical History  Past Medical History:  Diagnosis Date   Anemia    Aortic stenosis    Arthritis    CHF (congestive heart failure) (HCC)    Coronary artery disease    Diabetes mellitus without complication (HCC)    type 2   GERD (gastroesophageal reflux disease)    Heart murmur  History of blood transfusion    Hypercholesteremia    Hypertension    Hypothyroidism    Mitral regurgitation    Myocardial infarction [facility])    Peripheral vascular disease (HCC)    PONV (postoperative nausea and vomiting)    Thyroid disease    Has the patient had major surgery during 100 days prior to admission? Yes  Family History   family history includes CAD in an other family member; Diabetes in an other family member.  Current Medications  Current Facility-Administered Medications:    acetaminophen  (TYLENOL ) tablet 650  mg, 650 mg, Oral, Q6H PRN, 650 mg at 10/21/23 1531 **OR** acetaminophen  (TYLENOL ) suppository 650 mg, 650 mg, Rectal, Q6H PRN, Gerome, Emma M, PA-C   alum & mag hydroxide-simeth (MAALOX/MYLANTA) 200-200-20 MG/5ML suspension 15-30 mL, 15-30 mL, Oral, Q2H PRN, Gerome, Emma M, PA-C   atorvastatin  (LIPITOR) tablet 40 mg, 40 mg, Oral, Daily, Gerome Herring M, PA-C, 40 mg at 10/29/23 9146   bisacodyl  (DULCOLAX) suppository 10 mg, 10 mg, Rectal, Daily PRN, Hongalgi, Anand D, MD   Chlorhexidine  Gluconate Cloth 2 % PADS 6 each, 6 each, Topical, Daily, Hongalgi, Anand D, MD, 6 each at 10/29/23 0900   clopidogrel  (PLAVIX ) tablet 75 mg, 75 mg, Oral, Daily, Gerome Herring M, PA-C, 75 mg at 10/29/23 9147   DAPTOmycin  (CUBICIN ) IVPB 700 mg/100mL premix, 700 mg, Intravenous, Q1400, Judeth Trenda BIRCH, MD, Last Rate: 200 mL/hr at 10/28/23 1606, 700 mg at 10/28/23 1606   diphenhydrAMINE  (BENADRYL ) injection 50 mg, 50 mg, Intravenous, Q8H PRN, Gerome, Emma M, PA-C   enoxaparin  (LOVENOX ) injection 50 mg, 50 mg, Subcutaneous, Daily, Duda, Marcus V, MD, 50 mg at 10/29/23 9146   ferrous sulfate  tablet 325 mg, 325 mg, Oral, Q breakfast, Gerome Herring M, PA-C, 325 mg at 10/29/23 9147   gabapentin  (NEURONTIN ) capsule 100 mg, 100 mg, Oral, TID, Gerome, Emma M, PA-C, 100 mg at 10/29/23 9147   guaiFENesin -dextromethorphan (ROBITUSSIN DM) 100-10 MG/5ML syrup 15 mL, 15 mL, Oral, Q4H PRN, Gerome, Emma M, PA-C   hydrALAZINE  (APRESOLINE ) injection 5 mg, 5 mg, Intravenous, Q20 Min PRN, Gerome, Emma M, PA-C   HYDROmorphone  (DILAUDID ) injection 0.5 mg, 0.5 mg, Intravenous, Q3H PRN, Gerome Herring M, PA-C, 0.5 mg at 10/28/23 9070   insulin  aspart (novoLOG ) injection 0-5 Units, 0-5 Units, Subcutaneous, QHS, Hongalgi, Anand D, MD   insulin  aspart (novoLOG ) injection 0-9 Units, 0-9 Units, Subcutaneous, TID WC, Hongalgi, Anand D, MD   insulin  glargine-yfgn (SEMGLEE ) injection 10 Units, 10 Units, Subcutaneous, QHS, Collins, Emma M, PA-C, 10  Units at 10/28/23 2257   labetalol  (NORMODYNE ) injection 10 mg, 10 mg, Intravenous, Q10 min PRN, Gerome, Emma M, PA-C   levothyroxine  (SYNTHROID ) tablet 150 mcg, 150 mcg, Oral, QAC breakfast, Gerome Herring M, PA-C, 150 mcg at 10/29/23 0630   magnesium  sulfate IVPB 2 g 50 mL, 2 g, Intravenous, Daily PRN, Gerome, Emma M, PA-C   metoprolol  tartrate (LOPRESSOR ) injection 2-5 mg, 2-5 mg, Intravenous, Q2H PRN, Gerome, Emma M, PA-C   nystatin (MYCOSTATIN/NYSTOP) topical powder, , Topical, BID, Hongalgi, Anand D, MD, Given at 10/29/23 9140   ondansetron  (ZOFRAN ) injection 4 mg, 4 mg, Intravenous, Q6H PRN, Gerome, Emma M, PA-C   oxyCODONE -acetaminophen  (PERCOCET/ROXICET) 5-325 MG per tablet 1-2 tablet, 1-2 tablet, Oral, Q4H PRN, Gerome Herring HERO, PA-C, 2 tablet at 10/29/23 0910   pantoprazole  (PROTONIX ) EC tablet 40 mg, 40 mg, Oral, BID, Gerome Herring M, PA-C, 40 mg at 10/29/23 0853   phenol (CHLORASEPTIC) mouth spray 1 spray,  1 spray, Mouth/Throat, PRN, Gerome, Emma M, PA-C   polyethylene glycol (MIRALAX  / GLYCOLAX ) packet 17 g, 17 g, Oral, BID, Hongalgi, Anand D, MD, 17 g at 10/29/23 9146   potassium chloride  SA (KLOR-CON  M) CR tablet 20-40 mEq, 20-40 mEq, Oral, Daily PRN, Gerome, Emma M, PA-C   senna-docusate (Senokot-S) tablet 1 tablet, 1 tablet, Oral, QHS PRN, Gerome, Emma M, PA-C   senna-docusate (Senokot-S) tablet 1 tablet, 1 tablet, Oral, BID, Hongalgi, Anand D, MD, 1 tablet at 10/29/23 9146   sodium chloride  flush (NS) 0.9 % injection 10-40 mL, 10-40 mL, Intracatheter, Q12H, Hongalgi, Anand D, MD, 10 mL at 10/29/23 0856   trimethobenzamide  (TIGAN ) injection 200 mg, 200 mg, Intramuscular, Q8H PRN, Gerome Maurilio HERO, PA-C  Patients Current Diet:  Diet Order             Diet heart healthy/carb modified Room service appropriate? Yes; Fluid consistency: Thin  Diet effective now                  Precautions / Restrictions Precautions Precautions: Fall Precaution/Restrictions Comments: L  BKA residual limb protector and wound vax Restrictions Weight Bearing Restrictions Per Provider Order: Yes LLE Weight Bearing Per Provider Order: Non weight bearing   Has the patient had 2 or more falls or a fall with injury in the past year? No  Prior Activity Level Community (5-7x/wk): gets out of house daily; drives, works  Prior Functional Level Self Care: Did the patient need help bathing, dressing, using the toilet or eating? Independent  Indoor Mobility: Did the patient need assistance with walking from room to room (with or without device)? Unknown (was using wheelchair recently)  Stairs: Did the patient need assistance with internal or external stairs (with or without device)? Unknown (pt avoided stairs)  Functional Cognition: Did the patient need help planning regular tasks such as shopping or remembering to take medications? Independent  Patient Information Are you of Hispanic, Latino/a,or Spanish origin?: A. No, not of Hispanic, Latino/a, or Spanish origin What is your race?: A. White Do you need or want an interpreter to communicate with a doctor or health care staff?: 0. No  Patient's Response To:  Health Literacy and Transportation Is the patient able to respond to health literacy and transportation needs?: Yes Health Literacy - How often do you need to have someone help you when you read instructions, pamphlets, or other written material from your doctor or pharmacy?: Never In the past 12 months, has lack of transportation kept you from medical appointments or from getting medications?: No In the past 12 months, has lack of transportation kept you from meetings, work, or from getting things needed for daily living?: No  Journalist, newspaper / Equipment Home Equipment: Wheelchair - manual, Agricultural consultant (2 wheels), Other (comment), Shower seat, BSC/3in1, Rollator (4 wheels)  Prior Device Use: Indicate devices/aids used by the patient prior to current illness,  exacerbation or injury? Manual wheelchair  Current Functional Level Cognition  Orientation Level: Oriented X4    Extremity Assessment (includes Sensation/Coordination)  Upper Extremity Assessment: Generalized weakness  Lower Extremity Assessment: Defer to PT evaluation RLE Deficits / Details: R transmet amputation, hip, knee and ankle strength and ROM WFL LLE Deficits / Details: s/p 6/25 L BKA and 6/26 L BKA revision hip ROM and strength WFL LLE: Unable to fully assess due to pain    ADLs  Overall ADL's : Needs assistance/impaired Eating/Feeding: Set up, Sitting Grooming: Set up, Sitting Grooming Details (indicate cue type  and reason): Sitting EOB Upper Body Bathing: Set up, Sitting Lower Body Bathing: Moderate assistance, Sitting/lateral leans Lower Body Bathing Details (indicate cue type and reason): Able to reach BLEs while seated EOB Upper Body Dressing : Set up, Sitting Lower Body Dressing: Moderate assistance, Sitting/lateral leans Lower Body Dressing Details (indicate cue type and reason): Able to reach BLEs while seated EOB, would need assist to pull above waist Toilet Transfer: Moderate assistance, +2 for physical assistance, Squat-pivot Toileting- Clothing Manipulation and Hygiene: Contact guard assist Functional mobility during ADLs: Moderate assistance, +2 for physical assistance General ADL Comments: Completing ADLs EOB, good dynamic sitting balance, ability to wt shift for LB ADLs    Mobility  Overal bed mobility: Needs Assistance Bed Mobility: Sit to Supine, Supine to Sit Supine to sit: HOB elevated, Used rails, Modified independent (Device/Increase time) Sit to supine: Contact guard assist, HOB elevated, Used rails General bed mobility comments: pt able to pull herself into long sitting and then scoot herself to the EoB where se was able to don her    Transfers  Overall transfer level: Needs assistance Equipment used: Rolling walker (2 wheels), 2 person hand  held assist Transfers: Sit to/from Stand, Bed to chair/wheelchair/BSC Sit to Stand: Mod assist, +2 physical assistance Bed to/from chair/wheelchair/BSC transfer type:: Stand pivot Stand pivot transfers: +2 physical assistance, Mod assist General transfer comment: mod Ax2 for power up to standing with pt holding onto therapists arms, once in standing, Pt able to remove her L hand from therapist and reach towards arm rest of BSC to pivot and sit. After finished on BSC pt able to pivot to recliner with modAx2    Ambulation / Gait / Stairs / Engineer, drilling / Balance Balance Overall balance assessment: Needs assistance Sitting-balance support: No upper extremity supported, Feet supported Sitting balance-Leahy Scale: Good Standing balance support: During functional activity, Reliant on assistive device for balance Standing balance-Leahy Scale: Zero    Special needs/care consideration Wound Vac leg/left, distal, Skin Pressure injury-Stage 1: ankle/right, lateral; Surgical Incision: left, proximal; , Diabetic management  Semglee  10 units daily at bedtime; Novolog  0-5 units daily at bedtime; Novolog  0-6 units 3x daily with meals and Special service needs Orange contact precautions   Previous Home Environment  Living Arrangements: Alone Available Help at Discharge: Family, Available 24 hours/day Type of Home: House Home Layout: One level Home Access: Ramped entrance Bathroom Shower/Tub: Walk-in shower (6 lip) Bathroom Toilet: Handicapped height Bathroom Accessibility: Yes How Accessible: Accessible via wheelchair, Accessible via walker Home Care Services: No Additional Comments: mom lives across the street  Discharge Living Setting Plans for Discharge Living Setting: Patient's home Type of Home at Discharge: House Discharge Home Layout: One level Discharge Home Access: Ramped entrance Discharge Bathroom Shower/Tub: Walk-in shower Discharge Bathroom Toilet: Handicapped  height Discharge Bathroom Accessibility: Yes How Accessible: Accessible via wheelchair, Accessible via walker Does the patient have any problems obtaining your medications?: No  Social/Family/Support Systems Anticipated Caregiver: Tenisha Fleece, mother Anticipated Caregiver's Contact Information: [phone] Caregiver Availability: 24/7 Discharge Plan Discussed with Primary Caregiver: Yes Is Caregiver In Agreement with Plan?: Yes Does Caregiver/Family have Issues with Lodging/Transportation while Pt is in Rehab?: No  Goals Patient/Family Goal for Rehab: Supervision: PT/OT Expected length of stay: 10 days Pt/Family Agrees to Admission and willing to participate: Yes Program Orientation Provided & Reviewed with Pt/Caregiver Including Roles  & Responsibilities: Yes  Decrease burden of Care through IP rehab admission: NA  Possible need for SNF  placement upon discharge: Not anticipated  Patient Condition: I have reviewed medical records from [facility], spoken with CM, and patient and family member. I met with patient at the bedside for inpatient rehabilitation assessment.  Patient will benefit from ongoing PT and OT, can actively participate in 3 hours of therapy a day 5 days of the week, and can make measurable gains during the admission.  Patient will also benefit from the coordinated team approach during an Inpatient Acute Rehabilitation admission.  The patient will receive intensive therapy as well as Rehabilitation physician, nursing, social worker, and care management interventions.  Due to safety, skin/wound care, disease management, medication administration, pain management, and patient education the patient requires 24 hour a day rehabilitation nursing.  The patient is currently Mod assist overall with mobility and basic ADLs.  Discharge setting and therapy post discharge at home with home health is anticipated.  Patient has agreed to participate in the Acute Inpatient  Rehabilitation Program and will admit today.  Preadmission Screen Completed By:  Tinnie SHAUNNA Yvone Delayne, 10/29/2023 11:17 AM ______________________________________________________________________   Discussed status with Dr. Cornelio on 10/29/23 at 1113 and received approval for admission today.  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 Yes Co-Morbidities requiring supervision/potential complications: IDDM, multivessel CAD; PAD, hx of GI bleed due to AVM, chronic anemia; mod-severe aortic stenosis; hypothyroidism Due to bladder management, bowel management, safety, skin/wound care, disease management, medication administration, pain management, and patient education, does the patient require 24 hr/day rehab nursing? Yes Does the patient require coordinated care of a physician, rehab nurse, PT, OT, and SLP to address physical and functional deficits in the context of the above medical diagnosis(es)? Yes Addressing deficits in the following areas: balance, endurance, locomotion, strength, transferring, bowel/bladder control, bathing, dressing, feeding, grooming, and toileting Can the patient actively participate in an intensive therapy program of at least 3 hrs of therapy 5 days a week? Yes The potential for patient to make measurable gains while on inpatient rehab is good Anticipated functional outcomes upon discharge from inpatient rehab: supervision PT, supervision OT, n/a SLP Estimated rehab length of stay to reach the above functional goals is: 10-12 days Anticipated discharge destination: Home 10. Overall Rehab/Functional Prognosis: good   MD Signature:

## 2023-10-25 NOTE — Progress Notes (Addendum)
 PROGRESS NOTE   Kayla Schmitt  FMW:[id number]    DOB: 01-11-1966    DOA: 10/20/2023  PCP: [name], NP   I have briefly reviewed patients previous medical records in [facility].   Brief Hospital Course:  58 y.o. 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  Sepsis protocol was initiated and she was started on vancomycin  and cefepime .  Blood cultures grew gram-positive cocci but no clear source of infection.  CT of chest abdomen and pelvis without clear abnormality.  Venous Doppler negative for DVT.  She was noted to have purple spotty area of discoloration of left leg.  She was then transferred to [facility] for vascular surgery consult. At [facility] patient has been treated for sepsis with endorgan dysfunction including elevated lactic acid and AKI, peripheral vascular disease with increased pain of the left lower extremity, AKI, altered mental status, DM type II and elevated troponin secondary to demand ischemia. Patient also received 1 unit of blood transfusion as hemoglobin dropped 8.2-7. No GI bleed at outside hospital.    Assessment & Plan:   Necrotizing fasciitis of left lower extremity/PAD  Vascular surgery and orthopedics were consulted She has history of bilateral transmetatarsal amputations and previous bilateral lower extremity endovascular revascularization. S/p left BKA with wound VAC placement on 6/24 by Dr. Fonda Rim S/p left revision BKA, on 6/25 by Dr. Harden.  Orthopedic follow-up appreciated.  Indicate VAC will remain for 1 week until follow-up.  MRSA bacteremia Surgical intervention on left lower extremity as noted above. ID  managing antimicrobial treatment Vancomycin  switched to daptomycin . TEE 6/26 preliminary result without vegetations. Surveillance blood cultures x 2 from 6/22, negative, final. Surgical tissue 6/25: No growth to date/2 days. As per ID signed off on 6/27: Continue current dose of daptomycin  through 7/20, therapeutic drug monitoring of CK while on daptomycin , placing PICC line today.  They will arrange outpatient follow-up in ID clinic.  Acute kidney injury Suspecting prerenal in the setting of sepsis and hypotension Resolved.  Avoid nephrotoxics and hypotension.  Elevated troponin secondary to demand ischemia/history of CAD Outside facility troponin, 696 > 576 > 532.  No anginal symptoms or acute EKG changes. TTE: LVEF 50-55%, grade 2 diastolic dysfunction. Was on IV heparin  drip for PAD indication which was discontinued on 6/24. Continue atorvastatin  and Plavix .  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 Patient received a unit of PRBC at outside hospital. Follow CBCs daily and transfuse for hemoglobin 7 g or less Hemoglobin 7.6 >6.6, stable.  Insulin -dependent type 2 DM Good inpatient control on current dose of Semglee  10 units at bedtime and NovoLog  SSI Periodic fluctuations.  Chronic combined systolic and diastolic CHF EF appears to have improved compared to prior.  Clinically euvolemic.  Hypertension Controlled. Monitoring off of antihypertensives  Prolonged QT Minimize QT prolonging drugs Last EKG on 6/22 showed QTc of 486 ms.  Hypothyroidism Continue with levothyroxine .  Body mass index is 38.07 kg/m.   DVT prophylaxis: SCD's Start: 10/23/23 1248 SCDs Start: 10/20/23 2037     Code Status: Full Code:  Family Communication: Mother at bedside Disposition:  Status is: Inpatient Remains inpatient appropriate because: IV  antibiotics pending final culture results. Therapies  recommend AIR, should be medically ready for DC in the next 1 to 2 days.   Consultants:   Vascular surgery Orthopedics Infectious disease Cardiology  Procedures:   As above  Subjective:  Expected left lower extremity postop pain, worse with movement.  None when she lies still.  No other complaints reported.  Objective:   Vitals:   10/25/23 0430 10/25/23 0815 10/25/23 0905 10/25/23 1217  BP: (!) 159/66 (!) 147/60  (!) 152/66  Pulse: 84 78 87 80  Resp: 16 15  15   Temp: 98.7 F (37.1 C) 97.6 F (36.4 C)  98.2 F (36.8 C)  TempSrc: Oral Oral  Oral  SpO2: 98% 97% 98% 99%  Weight:      Height:        General exam: Young female, moderately built and nourished lying comfortably propped up in bed without distress. Respiratory system: Clear to auscultation.  No increased work of breathing. Cardiovascular system: S1 & S2 heard, RRR. No JVD, murmurs, rubs, gallops or clicks. No right lower extremity pedal edema.  Telemetry personally reviewed: Sinus rhythm with few PACs. Gastrointestinal system: Abdomen is nondistended, soft and nontender. No organomegaly or masses felt. Normal bowel sounds heard. Central nervous system: Alert and oriented. No focal neurological deficits. Extremities: Symmetric 5 x 5 power.  Healed right transmetatarsal amputation stump.  Left BKA stump site with wound VAC and postop dressing clean and dry-unchanged and stable. Skin: No rashes, lesions or ulcers Psychiatry: Judgement and insight appear normal. Mood & affect appropriate.     Data Reviewed:   I have personally reviewed following labs and imaging studies   CBC: Recent Labs  Lab 10/23/23 0435 10/24/23 0438 10/25/23 0526  WBC 9.5 10.1 10.1  HGB 7.9* 7.6* 7.6*  HCT 24.8* 24.8* 24.8*  MCV 90.5 92.9 92.5  PLT 260 268 287    Basic Metabolic Panel: Recent Labs  Lab 10/21/23 1230 10/22/23 0218 10/23/23 0435 10/24/23 0438 10/25/23 0526  NA 134* 135 135 138 139  K 4.0 3.5 4.1 3.5 3.9  CL  104 107 109 110 108  CO2 21* 20* 22 21* 23  GLUCOSE 170* 153* 187* 115* 102*  BUN 19 19 19 19 13   CREATININE 1.25* 1.15* 1.07* 1.15* 0.99  CALCIUM  7.5* 7.5* 7.3* 7.6* 7.8*  MG 1.9  --   --   --  2.2    Liver Function Tests: Recent Labs  Lab 10/20/23 2200 10/22/23 0218 10/23/23 0435 10/24/23 0438  AST 81* 59* 35 50*  ALT 42 37 28 26  ALKPHOS 143* 170* 171* 176*  BILITOT 0.6 0.8 0.7 0.4  PROT 5.0* 5.0* 5.2* 5.3*  ALBUMIN  <1.5* <1.5* <1.5* <1.5*    CBG: Recent Labs  Lab 10/24/23 2127 10/25/23 0818 10/25/23 1220  GLUCAP 123* 114* 144*    Microbiology Studies:   Recent Results (from the past 240 hours)  Culture, blood (Routine X 2) w Reflex to ID Panel     Status: None   Collection Time: 10/20/23 10:00 PM   Specimen: BLOOD LEFT HAND  Result Value Ref Range Status   Specimen Description BLOOD LEFT HAND  Final   Special Requests   Final    BOTTLES DRAWN AEROBIC AND ANAEROBIC Blood Culture adequate volume   Culture   Final    NO GROWTH 5 DAYS Performed at [facility], [address] [place], [place] [address]    Report Status 10/25/2023 FINAL  Final  Culture,  blood (Routine X 2) w Reflex to ID Panel     Status: None   Collection Time: 10/20/23 10:02 PM   Specimen: BLOOD LEFT ARM  Result Value Ref Range Status   Specimen Description BLOOD LEFT ARM  Final   Special Requests   Final    BOTTLES DRAWN AEROBIC AND ANAEROBIC Blood Culture adequate volume   Culture   Final    NO GROWTH 5 DAYS Performed at [facility], [address], [place], [place] [address]    Report Status 10/25/2023 FINAL  Final  Surgical pcr screen     Status: None   Collection Time: 10/21/23  9:19 PM   Specimen: Nasal Mucosa; Nasal Swab  Result Value Ref Range Status   MRSA, PCR NEGATIVE NEGATIVE Final   Staphylococcus aureus NEGATIVE NEGATIVE Final    Comment: (NOTE) The Xpert SA Assay (FDA approved for NASAL specimens in patients 59 years of age and older), is one component  of a comprehensive surveillance program. It is not intended to diagnose infection nor to guide or monitor treatment. Performed at [facility], [address], [place], [place] [address]   Aerobic/Anaerobic Culture w Gram Stain (surgical/deep wound)     Status: None (Preliminary result)   Collection Time: 10/23/23 11:39 AM   Specimen: PATH Soft tissue  Result Value Ref Range Status   Specimen Description TISSUE  Final   Special Requests A  Final   Gram Stain NO WBC SEEN NO ORGANISMS SEEN   Final   Culture   Final    NO GROWTH 2 DAYS NO ANAEROBES ISOLATED; CULTURE IN PROGRESS FOR 5 DAYS Performed at [facility], [address], [place], [place] [address]    Report Status PENDING  Incomplete    Radiology Studies:  US  EKG SITE RITE Result Date: 10/25/2023 If Site Rite image not attached, placement could not be confirmed due to current cardiac rhythm.  ECHO TEE Result Date: 10/24/2023    TRANSESOPHOGEAL ECHO REPORT   Patient Name:   Kayla Schmitt [facility] Date of Exam: 10/24/2023 Medical Rec #:  [id number]              Height:       64.0 in Accession #:    [id number]             Weight:       221.8 lb Date of Birth:  [birth year]               BSA:          2.044 m Patient Age:    57 years               BP:           156/65 mmHg Patient Gender: F                      HR:           84 bpm. Exam Location:  Inpatient Procedure: Transesophageal Echo, Cardiac Doppler, Color Doppler and Saline            Contrast Bubble Study (Both Spectral and Color Flow Doppler were            utilized during procedure). Indications:     Endocarditis  History:         Patient has prior history of Echocardiogram examinations, most  recent 10/21/2023. CAD; Risk Factors:Hypertension and                  Dyslipidemia.  Sonographer:     Philomena Daring Referring Phys:  [id number] TAYLOR A PARCELLS Diagnosing Phys: Madonna Large PROCEDURE: After discussion of the risks and benefits of a TEE, an informed  consent was obtained from the patient. The transesophogeal probe was passed without difficulty through the esophogus of the patient. Sedation performed by different physician. The patient was monitored while under deep sedation. Anesthestetic sedation was provided intravenously by Anesthesiology: 482mg  of Propofol , 60mg  of Lidocaine . The patient developed no complications during the procedure.  IMPRESSIONS  1. Left ventricular ejection fraction, by estimation, is 60 to 65%. The left ventricle has normal function. The left ventricle has no regional wall motion abnormalities.  2. Right ventricular systolic function is normal. The right ventricular size is normal.  3. No left atrial/left atrial appendage thrombus was detected. The LAA emptying velocity was 66 cm/s.  4. The mitral valve is degenerative. Mild to moderate mitral valve regurgitation. No evidence of mitral stenosis.  5. Native valve, calcified, reduced leaflet excursion, mild to moderate aortic regurgitation, moderate to severe aortic stenosis (peak velocity 3.4 m/s, mean gradient 27 mmHg, aortic valve area per VTI 1.07 cm, dimensional index 0.26).  6. There is mild (Grade II) layered plaque involving the descending aorta.  7. Agitated saline contrast bubble study was negative, with no evidence of any interatrial shunt. Conclusion(s)/Recommendation(s): No evidence of vegetation/infective endocarditis on this transesophageael echocardiogram. FINDINGS  Left Ventricle: Left ventricular ejection fraction, by estimation, is 60 to 65%. The left ventricle has normal function. The left ventricle has no regional wall motion abnormalities. The left ventricular internal cavity size was normal in size. Right Ventricle: The right ventricular size is normal. Right vetricular wall thickness was not well visualized. Right ventricular systolic function is normal. Left Atrium: Left atrial size was normal in size. No left atrial/left atrial appendage thrombus was detected.  The LAA emptying velocity was 66 cm/s. Right Atrium: Right atrial size was normal in size. Prominent Eustachian valve. Pericardium: There is no evidence of pericardial effusion. Mitral Valve: The mitral valve is degenerative in appearance. There is mild thickening of the mitral valve leaflet(s). Normal mobility of the mitral valve leaflets. Mild mitral annular calcification. Mild to moderate mitral valve regurgitation. No evidence of mitral valve stenosis. There is no evidence of mitral valve vegetation. Tricuspid Valve: The tricuspid valve is grossly normal. Tricuspid valve regurgitation is trivial. No evidence of tricuspid stenosis. There is no evidence of tricuspid valve vegetation. Aortic Valve: Native valve, calcified, reduced leaflet excursion, mild to moderate aortic regurgitation, moderate to severe aortic stenosis (peak velocity 3.4 m/s, mean gradient 27 mmHg, aortic valve area per VTI 1.07 cm, dimensional index 0.26). Aortic  valve mean gradient measures 27.2 mmHg. Aortic valve peak gradient measures 46.3 mmHg. Aortic valve area, by VTI measures 1.07 cm. There is no evidence of aortic valve vegetation. Pulmonic Valve: The pulmonic valve was grossly normal. Pulmonic valve regurgitation is trivial. No evidence of pulmonic stenosis. There is no evidence of pulmonic valve vegetation. Aorta: The aortic root and ascending aorta are structurally normal, with no evidence of dilitation. There is mild (Grade II) layered plaque involving the descending aorta. Venous: The left lower pulmonary vein, left upper pulmonary vein, right upper pulmonary vein and right lower pulmonary vein are normal. IAS/Shunts: The interatrial septum appears to be lipomatous. The atrial septum is grossly normal. Agitated saline  contrast was given intravenously to evaluate for intracardiac shunting. Agitated saline contrast bubble study was negative, with no evidence  of any interatrial shunt.  LEFT VENTRICLE PLAX 2D LVOT diam:     2.30 cm  LV SV:         74 LV SV Index:   36 LVOT Area:     4.15 cm  AORTIC VALVE                     PULMONIC VALVE AV Area (Vmax):    1.10 cm      RVOT Peak grad: 4 mmHg AV Area (Vmean):   1.16 cm AV Area (VTI):     1.07 cm AV Vmax:           340.25 cm/s AV Vmean:          243.250 cm/s AV VTI:            0.689 m AV Peak Grad:      46.3 mmHg AV Mean Grad:      27.2 mmHg LVOT Vmax:         89.90 cm/s LVOT Vmean:        68.100 cm/s LVOT VTI:          0.178 m LVOT/AV VTI ratio: 0.26  AORTA Ao Root diam: 3.10 cm Ao Asc diam:  3.50 cm  SHUNTS Systemic VTI:  0.18 m Systemic Diam: 2.30 cm Pulmonic VTI:  0.161 m Sunit Tolia Electronically signed by Madonna Large Signature Date/Time: 10/24/2023/1:56:25 PM    Final    EP STUDY Result Date: 10/24/2023 See surgical note for result.   Scheduled Meds:    atorvastatin   40 mg Oral Daily   clopidogrel   75 mg Oral Daily   docusate sodium   100 mg Oral Daily   enoxaparin  (LOVENOX ) injection  50 mg Subcutaneous Daily   ferrous sulfate   325 mg Oral Q breakfast   gabapentin   100 mg Oral TID   insulin  aspart  0-5 Units Subcutaneous QHS   insulin  aspart  0-6 Units Subcutaneous TID WC   insulin  glargine-yfgn  10 Units Subcutaneous QHS   levothyroxine   150 mcg Oral QAC breakfast   nystatin   Topical BID   pantoprazole   40 mg Oral BID   sodium chloride  flush  10-40 mL Intracatheter Q12H    Continuous Infusions:    DAPTOmycin  Stopped (10/24/23 1443)   magnesium  sulfate bolus IVPB       LOS: 5 days     Trenda Mar, MD,  FACP, [facility], [facility], [facility]   [facility] Advisor [place]      To contact the attending provider between 7A-7P or the covering provider during after hours 7P-7A, please log into the web site [email or web address] and access using universal [place] password for that web site. If you do not have the password, please call the hospital operator.  10/25/2023, 1:42 PM

## 2023-10-25 NOTE — Progress Notes (Signed)
 Inpatient Rehab Admissions:  Inpatient Rehab Consult received.  I met with patient and her mother [name] at the bedside for rehabilitation assessment and to discuss goals and expectations of an inpatient rehab admission.  Discussed average length of stay, insurance authorization requirement and discharge home after completion of CIR. Both acknowledged understanding. Pt interested in pursuing CIR and her mother is supportive. [name] confirmed that she will be able to provide 24/7 support for pt after discharge. Will continue to follow.  Signed: Tinnie Yvone Cohens, MS, CCC-SLP Admissions Coordinator [phone]

## 2023-10-25 NOTE — Progress Notes (Signed)
 [facility]  Date of Admission:  10/20/2023     Reason for Follow Up: Necrotizing fasciitis (HCC)  Total days of antibiotics 5         ASSESSMENT:  Kayla Schmitt is postop day #2 from revision of left below-knee amputation in the setting of necrotizing fasciitis and MRSA bacteremia.  TEE negative for endocarditis.  Blood cultures from 10/20/2023 finalized without growth.  Discussed plan of care to continue current dose of daptomycin  for 4 weeks for complicated bacteremia.  Discontinue midline and place PICC.  Counseled on importance of protein intake to optimize healing.  Home health/OPAT orders.  End of treatment planned for 11/17/2023.  Postoperative wound care per orthopedics.  Will arrange follow-up in ID clinic.  Contact precautions for MRSA infection.  Remaining medical and supportive care per internal medicine.  PLAN:  Continue current dose of daptomycin  through 11/17/2023 for complicated bacteremia Therapeutic drug monitoring of CK levels while on daptomycin . Discontinue midline and place PICC Postoperative wound care per orthopedics. Optimize protein intake for healing Home health/OPAT orders. Contact precautions for MRSA. Remaining medical and supportive care per internal medicine.  ID will sign off.  Please reconsult if indicated.  Diagnosis:  Necrotizing fasciitis and MRSA bacteremia   Allergies  Allergen Reactions   Trental  [Pentoxifylline ] Nausea And Vomiting   Vibramycin [Doxycycline] Nausea And Vomiting   Penicillins Rash    Tolerated cephalosporins 07/2022     OPAT Orders Discharge antibiotics to be given via PICC line Discharge antibiotics: Daptomycin  700 mg IV every 24 hours Per pharmacy protocol   Duration: 4 weeks  End Date: 11/17/23  [facility] Care Per Protocol:  Home health RN for IV administration and teaching; PICC line care and labs.    Labs weekly while on IV antibiotics: _X_ CBC with differential _X_ BMP __  CMP _X_ CRP _X_ ESR __ Vancomycin  trough _X_ CK  _X_ Please pull PIC at completion of IV antibiotics __ Please leave PIC in place until doctor has seen patient or been notified  Fax weekly labs to [phone]  Clinic Follow Up Appt:  11/14/2023 at 9:30 AM with Dr. Dea   Principal Problem:   Necrotizing fasciitis [facility]) Active Problems:   MRSA bacteremia   PVD (peripheral vascular disease) (HCC)   Insulin  dependent type 2 diabetes mellitus (HCC)   Hypothyroidism   Acute on chronic anemia   Sepsis (HCC)   AKI (acute kidney injury) (HCC)   Diffuse pain in left lower extremity   History of GI bleed   Elevated troponin   Hypokalemia   History of coronary artery disease   Prolonged QT interval   Gangrene of left foot (HCC)    atorvastatin   40 mg Oral Daily   clopidogrel   75 mg Oral Daily   docusate sodium   100 mg Oral Daily   enoxaparin  (LOVENOX ) injection  50 mg Subcutaneous Daily   ferrous sulfate   325 mg Oral Q breakfast   gabapentin   100 mg Oral TID   insulin  aspart  0-5 Units Subcutaneous QHS   insulin  aspart  0-6 Units Subcutaneous TID WC   insulin  glargine-yfgn  10 Units Subcutaneous QHS   levothyroxine   150 mcg Oral QAC breakfast   nystatin   Topical BID   pantoprazole   40 mg Oral BID   sodium chloride  flush  10-40 mL Intracatheter Q12H    SUBJECTIVE:  Afebrile overnight with no acute events.  Tolerating antibiotics with no adverse side effects.  No new  concerns/complaints.  Continue the postsurgical left leg pain.  Allergies  Allergen Reactions   Trental  [Pentoxifylline ] Nausea And Vomiting   Vibramycin [Doxycycline] Nausea And Vomiting   Penicillins Rash    Tolerated cephalosporins 07/2022      Review of Systems: Review of Systems  Constitutional:  Negative for chills, fever and weight loss.  Respiratory:  Negative for cough, shortness of breath and wheezing.   Cardiovascular:  Negative for chest pain.  Gastrointestinal:  Negative for  abdominal pain, constipation, diarrhea, nausea and vomiting.  Skin:  Negative for rash.      OBJECTIVE: Vitals:   10/24/23 2251 10/25/23 0430 10/25/23 0815 10/25/23 0905  BP: (!) 143/66 (!) 159/66 (!) 147/60   Pulse: 82 84 78 87  Resp: 16 16 15    Temp: 98.8 F (37.1 C) 98.7 F (37.1 C) 97.6 F (36.4 C)   TempSrc: Oral Oral Oral   SpO2: 98% 98% 97% 98%  Weight:      Height:       Body mass index is 38.07 kg/m.  Physical Exam Constitutional:      General: She is not in acute distress.    Appearance: She is well-developed.     Comments: Lying in bed with head of bed elevated; pleasant   Cardiovascular:     Rate and Rhythm: Normal rate and regular rhythm.     Heart sounds: Normal heart sounds.  Pulmonary:     Effort: Pulmonary effort is normal.     Breath sounds: Normal breath sounds.   Skin:    General: Skin is warm and dry.   Neurological:     Mental Status: She is alert and oriented to person, place, and time.   Psychiatric:        Behavior: Behavior normal.        Thought Content: Thought content normal.        Judgment: Judgment normal.     Lab Results Lab Results  Component Value Date   WBC 10.1 10/25/2023   HGB 7.6 (L) 10/25/2023   HCT 24.8 (L) 10/25/2023   MCV 92.5 10/25/2023   PLT 287 10/25/2023    Lab Results  Component Value Date   CREATININE 0.99 10/25/2023   BUN 13 10/25/2023   NA 139 10/25/2023   K 3.9 10/25/2023   CL 108 10/25/2023   CO2 23 10/25/2023    Lab Results  Component Value Date   ALT 26 10/24/2023   AST 50 (H) 10/24/2023   ALKPHOS 176 (H) 10/24/2023   BILITOT 0.4 10/24/2023     Microbiology: Recent Results (from the past 240 hours)  Culture, blood (Routine X 2) w Reflex to ID Panel     Status: None   Collection Time: 10/20/23 10:00 PM   Specimen: BLOOD LEFT HAND  Result Value Ref Range Status   Specimen Description BLOOD LEFT HAND  Final   Special Requests   Final    BOTTLES DRAWN AEROBIC AND ANAEROBIC Blood  Culture adequate volume   Culture   Final    NO GROWTH 5 DAYS Performed at [facility] Lab, [address], [place], [place] [address]    Report Status 10/25/2023 FINAL  Final  Culture, blood (Routine X 2) w Reflex to ID Panel     Status: None   Collection Time: 10/20/23 10:02 PM   Specimen: BLOOD LEFT ARM  Result Value Ref Range Status   Specimen Description BLOOD LEFT ARM  Final   Special Requests  Final    BOTTLES DRAWN AEROBIC AND ANAEROBIC Blood Culture adequate volume   Culture   Final    NO GROWTH 5 DAYS Performed at [facility], [address] [place], [place] [address]    Report Status 10/25/2023 FINAL  Final  Surgical pcr screen     Status: None   Collection Time: 10/21/23  9:19 PM   Specimen: Nasal Mucosa; Nasal Swab  Result Value Ref Range Status   MRSA, PCR NEGATIVE NEGATIVE Final   Staphylococcus aureus NEGATIVE NEGATIVE Final    Comment: (NOTE) The Xpert SA Assay (FDA approved for NASAL specimens in patients 84 years of age and older), is one component of a comprehensive surveillance program. It is not intended to diagnose infection nor to guide or monitor treatment. Performed at [facility], [address], [place], [place] [address]   Aerobic/Anaerobic Culture w Gram Stain (surgical/deep wound)     Status: None (Preliminary result)   Collection Time: 10/23/23 11:39 AM   Specimen: PATH Soft tissue  Result Value Ref Range Status   Specimen Description TISSUE  Final   Special Requests A  Final   Gram Stain NO WBC SEEN NO ORGANISMS SEEN   Final   Culture   Final    NO GROWTH 2 DAYS Performed at [facility], [address] [place], [place] [address]    Report Status PENDING  Incomplete    I have personally spent 32 minutes involved in face-to-face and non-face-to-face activities for this patient on the day of the visit. Professional time spent includes the following activities: preparing to see the patient (review of tests),  obtaining and reviewing separately obtained history (admission/discharge record), performing a medically appropriate examination, ordering medications, communicating with other health care professionals, documenting clinical information in the EMR, communicating results and counseling patient and family regarding medication and plan of care, and care coordination.    Greg Lenzi Marmo, NP [facility]  10/25/2023  12:07 PM

## 2023-10-25 NOTE — Anesthesia Postprocedure Evaluation (Signed)
 Anesthesia Post Note  Patient: Kayla Schmitt  Procedure(s) Performed: AMPUTATION BELOW KNEE (Left: Knee)     Patient location during evaluation: PACU Anesthesia Type: General Level of consciousness: awake and alert Pain management: pain level controlled Vital Signs Assessment: post-procedure vital signs reviewed and stable Respiratory status: spontaneous breathing, nonlabored ventilation and respiratory function stable Cardiovascular status: blood pressure returned to baseline and stable Postop Assessment: no apparent nausea or vomiting Anesthetic complications: no   No notable events documented.               Ashia Dehner

## 2023-10-25 NOTE — Progress Notes (Signed)
 PICC order noted.  Yoko, RN made aware that PICC will be placed tomorrow.

## 2023-10-25 NOTE — Progress Notes (Signed)
 Physical Therapy Treatment Patient Details Name: Kayla Schmitt MRN: [id number] DOB: Feb 03, 1966 Today's Date: 10/25/2023   History of Present Illness Pt is a 58 y.o female admitted 6/22 for lethargy & LLE pain. Sepsis protocol initiated. Negative for osteomyelitis & DVT. 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    PT Comments  Pt reports needing to urinated and in grateful for opportunity to get up to [facility]. Pt is contact guard for bed mobility. With shoe donned on her R foot, attempted to come to standing in RW with maxAx2. Pt surprised by the difference in her CoG and difficulty with standing. Pt then set up for a pivot transfer to [facility]. Pt total Ax2 for pivot to [facility] but with increased cuing for use of her R heel as a pivot point pt able to return to EoB with maxAx2. Pt pleased with progress and requests to go to AIR to be able to gain her independence back. Pt is requests theraband at end of session to work on UE strength over the weekend. Patient will benefit from intensive inpatient follow-up therapy, >3 hours/day. Pt is very motivated and has great support at discharge to be able to maximize her therapy in that environment. PT will continue to follow acutely.      If plan is discharge home, recommend the following: A lot of help with walking and/or transfers;A lot of help with bathing/dressing/bathroom;Assistance with cooking/housework;Assist for transportation;Help with stairs or ramp for entrance     Equipment Recommendations  None recommended by PT    Recommendations for Other Services Rehab consult     Precautions / Restrictions Precautions Precautions: Fall Recall of Precautions/Restrictions: Intact Precaution/Restrictions Comments: L BKA residual limb protector and wound vax Restrictions Weight Bearing Restrictions Per  Provider Order: Yes LLE Weight Bearing Per Provider Order: Non weight bearing     Mobility  Bed Mobility Overal bed mobility: Needs Assistance Bed Mobility: Sit to Supine, Supine to Sit     Supine to sit: Contact guard, HOB elevated, Used rails Sit to supine: Contact guard assist, HOB elevated, Used rails   General bed mobility comments: Heavy use of railings for supine to sit    Transfers Overall transfer level: Needs assistance Equipment used: Rolling walker (2 wheels), 2 person hand held assist Transfers: Sit to/from Stand, Bed to chair/wheelchair/BSC Sit to Stand: Max assist, +2 physical assistance Stand pivot transfers: Max assist, +2 physical assistance, Total assist         General transfer comment: donned pt's R shoe and attempted to come to standing in RW pt surprised how difficult it was to account for new center of gravity, pt requires totalAx2 for pivot from bed to bsc, with increased cuing for pivot on R heel pt able to return to bed with maxAx2.          Balance Overall balance assessment: Needs assistance Sitting-balance support: No upper extremity supported, Feet supported Sitting balance-Leahy Scale: Good     Standing balance support: During functional activity, Reliant on assistive device for balance Standing balance-Leahy Scale: Zero                              Communication Communication Communication: No apparent difficulties  Cognition Arousal: Alert Behavior During Therapy: [facility] [facility] for tasks assessed/performed  Following commands: Intact      Cueing Cueing Techniques: Verbal cues  Exercises      General Comments General comments (skin integrity, edema, etc.): mother present during session, VSS on RA, pt requesting red theraband for working on UE strengthening over the weekend.      Pertinent Vitals/Pain Pain Assessment Pain Assessment: Faces Faces Pain Scale: Hurts even  more Breathing: normal Negative Vocalization: none Facial Expression: smiling or inexpressive Body Language: relaxed Consolability: no need to console PAINAD Score: 0 Pain Location: LLE Pain Descriptors / Indicators: Discomfort, Grimacing, Guarding Pain Intervention(s): Limited activity within patient's tolerance, Monitored during session, Repositioned    Home Living                          Prior Function            PT Goals (current goals can now be found in the care plan section) Acute Rehab PT Goals PT Goal Formulation: With patient/family Time For Goal Achievement: 11/07/23 Potential to Achieve Goals: Good Progress towards PT goals: Progressing toward goals    Frequency    Min 3X/week       AM-PAC PT 6 Clicks Mobility   Outcome Measure  Help needed turning from your back to your side while in a flat bed without using bedrails?: None Help needed moving from lying on your back to sitting on the side of a flat bed without using bedrails?: A Little Help needed moving to and from a bed to a chair (including a wheelchair)?: A Little Help needed standing up from a chair using your arms (e.g., wheelchair or bedside chair)?: Total Help needed to walk in Schmitt room?: Total Help needed climbing 3-5 steps with a railing? : Total 6 Click Score: 13    End of Session   Activity Tolerance: Patient tolerated treatment well Patient left: in bed;with call bell/phone within reach;with bed alarm set;with family/visitor present Nurse Communication: Mobility status PT Visit Diagnosis: Unsteadiness on feet (R26.81);Other abnormalities of gait and mobility (R26.89);Muscle weakness (generalized) (M62.81);Difficulty in walking, not elsewhere classified (R26.2)     Time: 8567-8486 PT Time Calculation (min) (ACUTE ONLY): 41 min  Charges:    $Therapeutic Activity: 38-52 mins PT General Charges $$ ACUTE PT VISIT: 1 Visit                     Marki Frede B. Fleeta Lapidus  PT, DPT Acute Rehabilitation Services Please use secure chat or  Call Office [phone]    Kayla Schmitt 10/25/2023, 3:29 PM

## 2023-10-25 NOTE — Progress Notes (Signed)
 PHARMACY CONSULT NOTE FOR:  OUTPATIENT  PARENTERAL ANTIBIOTIC THERAPY (OPAT)  Indication: MRSA bacteremia Regimen: Daptomycin  700 mg IV every 24 hours End date: 11/17/23 (4 weeks from neg BCx 6/22)  IV antibiotic discharge orders are pended. To discharging provider:  please sign these orders via discharge navigator,  Select New Orders & click on the button choice - Manage This Unsigned Work.   Thank you for allowing pharmacy to be a part of this patient's care.  Almarie Lunger, PharmD, BCPS, BCIDP Infectious Diseases Clinical Pharmacist 10/25/2023 9:58 AM   **Pharmacist phone directory can now be found on [email or web address] ([email or web address]).  Listed under [facility].

## 2023-10-25 NOTE — Plan of Care (Signed)
  Problem: Health Behavior/Discharge Planning: Goal: Ability to manage health-related needs will improve Outcome: Progressing   Problem: Activity: Goal: Risk for activity intolerance will decrease Outcome: Progressing   Problem: Nutrition: Goal: Adequate nutrition will be maintained Outcome: Progressing   Problem: Coping: Goal: Level of anxiety will decrease Outcome: Progressing   Problem: Safety: Goal: Ability to remain free from injury will improve Outcome: Progressing

## 2023-10-26 DIAGNOSIS — B9562 Methicillin resistant Staphylococcus aureus infection as the cause of diseases classified elsewhere: Secondary | ICD-10-CM | POA: Diagnosis not present

## 2023-10-26 DIAGNOSIS — R7881 Bacteremia: Secondary | ICD-10-CM | POA: Diagnosis not present

## 2023-10-26 DIAGNOSIS — M726 Necrotizing fasciitis: Secondary | ICD-10-CM | POA: Diagnosis not present

## 2023-10-26 LAB — CBC
HCT: 25.2 % — ABNORMAL LOW (ref 36.0–46.0)
Hemoglobin: 7.9 g/dL — ABNORMAL LOW (ref 12.0–15.0)
MCH: 29 pg (ref 26.0–34.0)
MCHC: 31.3 g/dL (ref 30.0–36.0)
MCV: 92.6 fL (ref 80.0–100.0)
Platelets: 287 10*3/uL (ref 150–400)
RBC: 2.72 MIL/uL — ABNORMAL LOW (ref 3.87–5.11)
RDW: 15.5 % (ref 11.5–15.5)
WBC: 9 10*3/uL (ref 4.0–10.5)
nRBC: 0 % (ref 0.0–0.2)

## 2023-10-26 LAB — GLUCOSE, CAPILLARY
Glucose-Capillary: 351 mg/dL — ABNORMAL HIGH (ref 70–99)
Glucose-Capillary: 143 mg/dL — ABNORMAL HIGH (ref 70–99)
Glucose-Capillary: 119 mg/dL — ABNORMAL HIGH (ref 70–99)
Glucose-Capillary: 110 mg/dL — ABNORMAL HIGH (ref 70–99)

## 2023-10-26 MED ORDER — CHLORHEXIDINE GLUCONATE CLOTH 2 % EX PADS
6.0000 | MEDICATED_PAD | Freq: Every day | CUTANEOUS | Status: DC
  Administered 2023-10-26 – 2023-10-29 (×4): 6 via TOPICAL

## 2023-10-26 MED ORDER — SODIUM CHLORIDE 0.9% FLUSH: 10.0000 mL | INTRAVENOUS | Status: DC | PRN

## 2023-10-26 MED ORDER — SODIUM CHLORIDE 0.9% FLUSH
10.0000 mL | Freq: Two times a day (BID) | INTRAVENOUS | Status: DC
  Administered 2023-10-26 – 2023-10-29 (×5): 10 mL

## 2023-10-26 NOTE — Progress Notes (Signed)
 PROGRESS NOTE   Kayla Schmitt  FMW:[id number]    DOB: 07-24-1965    DOA: 10/20/2023  PCP: [name], NP   I have briefly reviewed patients previous medical records in [facility].   Brief Hospital Course:  58 y.o. 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  Sepsis protocol was initiated and she was started on vancomycin  and cefepime .  Blood cultures grew gram-positive cocci but no clear source of infection.  CT of chest abdomen and pelvis without clear abnormality.  Venous Doppler negative for DVT.  She was noted to have purple spotty area of discoloration of left leg.  She was then transferred to [facility] for vascular surgery consult. At [facility] patient has been treated for sepsis with endorgan dysfunction including elevated lactic acid and AKI, peripheral vascular disease with increased pain of the left lower extremity, AKI, altered mental status, DM type II and elevated troponin secondary to demand ischemia. Patient also received 1 unit of blood transfusion as hemoglobin dropped 8.2-7. No GI bleed at outside hospital.   Stable for DC to CIR.   Assessment & Plan:   Necrotizing fasciitis of left lower extremity/PAD  Vascular surgery and orthopedics were consulted She has history of bilateral transmetatarsal amputations and previous bilateral lower extremity endovascular revascularization. S/p left BKA with wound VAC placement on 6/24 by Dr. Fonda Rim S/p left revision BKA, on 6/25 by Dr. Harden.  Orthopedic follow-up appreciated.  Indicate VAC will remain for 1 week until follow-up.  MRSA bacteremia Surgical intervention on left lower extremity  as noted above. ID managing antimicrobial treatment Vancomycin  switched to daptomycin . TEE 6/26 preliminary result without vegetations. Surveillance blood cultures x 2 from 6/22, negative, final. Surgical tissue 6/25: No growth to date/2 days. As per ID sign off on 6/27: Continue current dose of daptomycin  through 7/20, therapeutic drug monitoring of CK while on daptomycin .They will arrange outpatient follow-up in ID clinic. PICC line placed 6/28.  Acute kidney injury Suspecting prerenal in the setting of sepsis and hypotension Resolved.  Avoid nephrotoxics and hypotension.  Elevated troponin secondary to demand ischemia/history of CAD Outside facility troponin, 696 > 576 > 532.  No anginal symptoms or acute EKG changes. TTE: LVEF 50-55%, grade 2 diastolic dysfunction. Was on IV heparin  drip for PAD indication which was discontinued on 6/24. Continue atorvastatin  and Plavix .  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 Patient received a unit of PRBC at outside hospital. Follow CBCs daily and transfuse for hemoglobin 7 g or less Hemoglobin 7.6 >7.6 >7.9, stable.  Insulin -dependent type 2 DM Good inpatient control on current dose of Semglee  10 units at bedtime and NovoLog  SSI Periodic fluctuations. Had CBG of 351 this morning which was the only aberrancy otherwise CBGs mostly in the 110-140 range.  Monitor.  Chronic combined systolic and diastolic CHF EF appears to have improved compared to prior.  Clinically euvolemic.  Hypertension Controlled. Monitoring off of antihypertensives  Prolonged QT Minimize QT prolonging drugs Last EKG on 6/22 showed QTc of 486 ms.  Hypothyroidism Continue with levothyroxine .  Body mass index is 38.07 kg/m.   DVT prophylaxis: SCD's Start: 10/23/23 1248 SCDs Start: 10/20/23  2037     Code Status: Full Code:  Family Communication: Mother at bedside  today Disposition:  Stable for DC to CIR.   Consultants:   Vascular surgery Orthopedics Infectious disease Cardiology  Procedures:   As above  Subjective:  No complaints reported.  Objective:   Vitals:   10/26/23 0007 10/26/23 0434 10/26/23 0913 10/26/23 1522  BP: (!) 138/54 (!) 152/59 139/63 (!) 143/64  Pulse: 75 72 80 77  Resp: 20 18 16 16   Temp: 98.2 F (36.8 C) 98.3 F (36.8 C) 98.4 F (36.9 C) 98.6 F (37 C)  TempSrc: Oral Oral Oral Oral  SpO2: 97% 97% 100% 98%  Weight:      Height:        General exam: Young female, moderately built and nourished lying comfortably propped up in bed without distress. Respiratory system: Clear to auscultation.  No increased work of breathing. Cardiovascular system: S1 & S2 heard, RRR. No JVD, murmurs, rubs, gallops or clicks. No right lower extremity pedal edema.  Telemetry personally reviewed: Sinus rhythm.  Discontinued telemetry. Gastrointestinal system: Abdomen is nondistended, soft and nontender. No organomegaly or masses felt. Normal bowel sounds heard. Central nervous system: Alert and oriented. No focal neurological deficits. Extremities: Symmetric 5 x 5 power.  Healed right transmetatarsal amputation stump.  Left BKA stump site with wound VAC and postop dressing clean and dry-unchanged and stable. Skin: No rashes, lesions or ulcers Psychiatry: Judgement and insight appear normal. Mood & affect appropriate.     Data Reviewed:   I have personally reviewed following labs and imaging studies   CBC: Recent Labs  Lab 10/24/23 0438 10/25/23 0526 10/26/23 0441  WBC 10.1 10.1 9.0  HGB 7.6* 7.6* 7.9*  HCT 24.8* 24.8* 25.2*  MCV 92.9 92.5 92.6  PLT 268 287 287    Basic Metabolic Panel: Recent Labs  Lab 10/21/23 1230 10/22/23 0218 10/23/23 0435 10/24/23 0438 10/25/23 0526  NA 134* 135 135 138 139  K 4.0 3.5 4.1 3.5 3.9  CL 104 107 109 110 108  CO2 21* 20* 22 21* 23  GLUCOSE 170* 153* 187* 115* 102*  BUN 19  19 19 19 13   CREATININE 1.25* 1.15* 1.07* 1.15* 0.99  CALCIUM  7.5* 7.5* 7.3* 7.6* 7.8*  MG 1.9  --   --   --  2.2    Liver Function Tests: Recent Labs  Lab 10/20/23 2200 10/22/23 0218 10/23/23 0435 10/24/23 0438  AST 81* 59* 35 50*  ALT 42 37 28 26  ALKPHOS 143* 170* 171* 176*  BILITOT 0.6 0.8 0.7 0.4  PROT 5.0* 5.0* 5.2* 5.3*  ALBUMIN  <1.5* <1.5* <1.5* <1.5*    CBG: Recent Labs  Lab 10/25/23 2128 10/26/23 0820 10/26/23 1250  GLUCAP 138* 351* 143*    Microbiology Studies:   Recent Results (from the past 240 hours)  Culture, blood (Routine X 2) w Reflex to ID Panel     Status: None   Collection Time: 10/20/23 10:00 PM   Specimen: BLOOD LEFT HAND  Result Value Ref Range Status   Specimen Description BLOOD LEFT HAND  Final   Special Requests   Final    BOTTLES DRAWN AEROBIC AND ANAEROBIC Blood Culture adequate volume   Culture   Final    NO GROWTH 5 DAYS Performed at [facility], [address], [place], [place] [address]    Report Status 10/25/2023 FINAL  Final  Culture, blood (Routine X 2) w Reflex to ID Panel  Status: None   Collection Time: 10/20/23 10:02 PM   Specimen: BLOOD LEFT ARM  Result Value Ref Range Status   Specimen Description BLOOD LEFT ARM  Final   Special Requests   Final    BOTTLES DRAWN AEROBIC AND ANAEROBIC Blood Culture adequate volume   Culture   Final    NO GROWTH 5 DAYS Performed at [facility], [address], [place], [place] [address]    Report Status 10/25/2023 FINAL  Final  Surgical pcr screen     Status: None   Collection Time: 10/21/23  9:19 PM   Specimen: Nasal Mucosa; Nasal Swab  Result Value Ref Range Status   MRSA, PCR NEGATIVE NEGATIVE Final   Staphylococcus aureus NEGATIVE NEGATIVE Final    Comment: (NOTE) The Xpert SA Assay (FDA approved for NASAL specimens in patients 54 years of age and older), is one component of a comprehensive surveillance program. It is not intended to diagnose infection nor  to guide or monitor treatment. Performed at [facility], [address], [place], [place] [address]   Aerobic/Anaerobic Culture w Gram Stain (surgical/deep wound)     Status: None (Preliminary result)   Collection Time: 10/23/23 11:39 AM   Specimen: PATH Soft tissue  Result Value Ref Range Status   Specimen Description TISSUE  Final   Special Requests A  Final   Gram Stain NO WBC SEEN NO ORGANISMS SEEN   Final   Culture   Final    NO GROWTH 3 DAYS NO ANAEROBES ISOLATED; CULTURE IN PROGRESS FOR 5 DAYS Performed at [facility], [address] [place], [place] [address]    Report Status PENDING  Incomplete    Radiology Studies:  US  EKG SITE RITE Result Date: 10/25/2023 If Site Rite image not attached, placement could not be confirmed due to current cardiac rhythm.   Scheduled Meds:    atorvastatin   40 mg Oral Daily   Chlorhexidine  Gluconate Cloth  6 each Topical Daily   clopidogrel   75 mg Oral Daily   docusate sodium   100 mg Oral Daily   enoxaparin  (LOVENOX ) injection  50 mg Subcutaneous Daily   ferrous sulfate   325 mg Oral Q breakfast   gabapentin   100 mg Oral TID   insulin  aspart  0-5 Units Subcutaneous QHS   insulin  aspart  0-6 Units Subcutaneous TID WC   insulin  glargine-yfgn  10 Units Subcutaneous QHS   levothyroxine   150 mcg Oral QAC breakfast   nystatin   Topical BID   pantoprazole   40 mg Oral BID   sodium chloride  flush  10-40 mL Intracatheter Q12H   sodium chloride  flush  10-40 mL Intracatheter Q12H    Continuous Infusions:    DAPTOmycin  700 mg (10/26/23 1521)   magnesium  sulfate bolus IVPB       LOS: 6 days     Trenda Mar, MD,  FACP, [facility], [facility], [facility]   [facility] [place]      To contact the attending provider between 7A-7P or the covering provider during after hours 7P-7A, please log into the web site [email or web address] and access using universal [place] password for that web site. If you  do not have the password, please call the hospital operator.  10/26/2023, 3:43 PM

## 2023-10-26 NOTE — Progress Notes (Signed)
 Peripherally Inserted Central Catheter Placement  The IV Nurse has discussed with the patient and/or persons authorized to consent for the patient, the purpose of this procedure and the potential benefits and risks involved with this procedure.  The benefits include less needle sticks, lab draws from the catheter, and the patient may be discharged home with the catheter. Risks include, but not limited to, infection, bleeding, blood clot (thrombus formation), and puncture of an artery; nerve damage and irregular heartbeat and possibility to perform a PICC exchange if needed/ordered by physician.  Alternatives to this procedure were also discussed.  Bard Power PICC patient education guide, fact sheet on infection prevention and patient information card has been provided to patient /or left at bedside.    PICC Placement Documentation  PICC Single Lumen 10/26/23 Left Basilic 46 cm 1 cm (Active)  Indication for Insertion or Continuance of Line Prolonged intravenous therapies 10/26/23 1200  Exposed Catheter (cm) 1 cm 10/26/23 1200  Site Assessment Clean, Dry, Intact 10/26/23 1200  Line Status Flushed;Saline locked;Blood return noted 10/26/23 1200  Dressing Type Transparent;Securing device 10/26/23 1200  Dressing Status Antimicrobial disc/dressing in place;Clean, Dry, Intact 10/26/23 1200  Line Care Connections checked and tightened 10/26/23 1200  Line Adjustment (NICU/IV Team Only) No 10/26/23 1200  Dressing Intervention New dressing;Adhesive placed at insertion site (IV team only) 10/26/23 1200  Dressing Change Due 11/02/23 10/26/23 1200       Ethyl Priestly Renee 10/26/2023, 12:19 PM

## 2023-10-27 DIAGNOSIS — K5901 Slow transit constipation: Secondary | ICD-10-CM | POA: Diagnosis not present

## 2023-10-27 DIAGNOSIS — R7881 Bacteremia: Secondary | ICD-10-CM | POA: Diagnosis not present

## 2023-10-27 DIAGNOSIS — M726 Necrotizing fasciitis: Secondary | ICD-10-CM | POA: Diagnosis not present

## 2023-10-27 DIAGNOSIS — B9562 Methicillin resistant Staphylococcus aureus infection as the cause of diseases classified elsewhere: Secondary | ICD-10-CM | POA: Diagnosis not present

## 2023-10-27 LAB — GLUCOSE, CAPILLARY
Glucose-Capillary: 109 mg/dL — ABNORMAL HIGH (ref 70–99)
Glucose-Capillary: 377 mg/dL — ABNORMAL HIGH (ref 70–99)
Glucose-Capillary: 93 mg/dL (ref 70–99)
Glucose-Capillary: 151 mg/dL — ABNORMAL HIGH (ref 70–99)
Glucose-Capillary: 121 mg/dL — ABNORMAL HIGH (ref 70–99)

## 2023-10-27 MED ORDER — POLYETHYLENE GLYCOL 3350 17 G PO PACK
17.0000 g | PACK | Freq: Two times a day (BID) | ORAL | Status: DC
  Administered 2023-10-27 – 2023-10-29 (×4): 17 g via ORAL
  Filled 2023-10-27 (×5): qty 1

## 2023-10-27 MED ORDER — BISACODYL 5 MG PO TBEC
5.0000 mg | DELAYED_RELEASE_TABLET | Freq: Once | ORAL | Status: AC
  Administered 2023-10-27: 5 mg via ORAL
  Filled 2023-10-27: qty 1

## 2023-10-27 MED ORDER — SENNOSIDES-DOCUSATE SODIUM 8.6-50 MG PO TABS
1.0000 | ORAL_TABLET | Freq: Two times a day (BID) | ORAL | Status: DC
  Administered 2023-10-27 – 2023-10-29 (×5): 1 via ORAL
  Filled 2023-10-27 (×5): qty 1

## 2023-10-27 NOTE — Progress Notes (Signed)
 PROGRESS NOTE   Kayla Schmitt  FMW:[id number]    DOB: Sep 16, 1965    DOA: 10/20/2023  PCP: [name], NP   I have briefly reviewed patients previous medical records in [facility]   Brief Hospital Course:  58 y.o. 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  Sepsis protocol was initiated and she was started on vancomycin  and cefepime .  Blood cultures grew gram-positive cocci but no clear source of infection.  CT of chest abdomen and pelvis without clear abnormality.  Venous Doppler negative for DVT.  She was noted to have purple spotty area of discoloration of left leg.  She was then transferred to [facility] for vascular surgery consult. At [facility] patient has been treated for sepsis with endorgan dysfunction including elevated lactic acid and AKI, peripheral vascular disease with increased pain of the left lower extremity, AKI, altered mental status, DM type II and elevated troponin secondary to demand ischemia. Patient also received 1 unit of blood transfusion as hemoglobin dropped 8.2-7. No GI bleed at outside hospital.   Stable for DC to CIR.   Assessment & Plan:   Necrotizing fasciitis of left lower extremity/PAD  Vascular surgery and orthopedics were consulted She has history of bilateral transmetatarsal amputations and previous bilateral lower extremity endovascular revascularization. S/p left BKA with wound VAC placement on 6/24 by Dr. Fonda Rim S/p left revision BKA, on 6/25 by Dr. Harden.  Orthopedic follow-up appreciated.  Indicate VAC will remain for 1 week until follow-up.  MRSA bacteremia Surgical intervention on left lower extremity  as noted above. ID managing antimicrobial treatment Vancomycin  switched to daptomycin . TEE 6/26 preliminary result without vegetations. Surveillance blood cultures x 2 from 6/22, negative, final. Surgical tissue 6/25: No growth to date/2 days. As per ID sign off on 6/27: Continue current dose of daptomycin  through 7/20, therapeutic drug monitoring of CK while on daptomycin .They will arrange outpatient follow-up in ID clinic. PICC line placed 6/28.  Acute kidney injury Suspecting prerenal in the setting of sepsis and hypotension Resolved.  Avoid nephrotoxics and hypotension.  Elevated troponin secondary to demand ischemia/history of CAD Outside facility troponin, 696 > 576 > 532.  No anginal symptoms or acute EKG changes. TTE: LVEF 50-55%, grade 2 diastolic dysfunction. Was on IV heparin  drip for PAD indication which was discontinued on 6/24. Continue atorvastatin  and Plavix .  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 Patient received a unit of PRBC at outside hospital. Follow CBCs daily and transfuse for hemoglobin 7 g or less Hemoglobin 7.6 >7.6 >7.9, stable.  Insulin -dependent type 2 DM Good inpatient control on current dose of Semglee  10 units at bedtime and NovoLog  SSI Periodic fluctuations. Had CBG of 351 this morning which was the only aberrancy otherwise CBGs mostly in the 110-140 range.  Monitor.  Chronic combined systolic and diastolic CHF EF appears to have improved compared to prior.  Clinically euvolemic.  Hypertension Controlled. Monitoring off of antihypertensives  Prolonged QT Minimize QT prolonging drugs Last EKG on 6/22 showed QTc of 486 ms.  Hypothyroidism Continue with levothyroxine .  Constipation No BM since 6/23.  Passing flatus.  No abdominal pain. Adjusted bowel regimen.  MiraLAX   17 g twice daily, Senokot S, 1 tablet twice daily.  Dulcolax 5 Mg p.o. x 1.   Mobilize.  Body mass index is 38.07 kg/m.   DVT prophylaxis: SCD's Start: 10/23/23 1248 SCDs Start: 10/20/23 2037     Code Status: Full Code:  Family Communication: Mother at bedside today Disposition:  Stable for DC to CIR.   Consultants:   Vascular surgery Orthopedics Infectious disease Cardiology  Procedures:   As above  Subjective:  Constipation without BM for several days.  Passing flatus.  No abdominal pain, nausea or vomiting.  No pain reported.  Slept well last night.  Tolerating diet.  Objective:   Vitals:   10/26/23 2036 10/27/23 0015 10/27/23 0420 10/27/23 0819  BP: (!) 155/66 (!) 143/54 (!) 153/61 (!) 153/63  Pulse: 77 76 77 74  Resp: 19 20 17 16   Temp: 99 F (37.2 C) 98.2 F (36.8 C) 97.9 F (36.6 C) 98.4 F (36.9 C)  TempSrc: Oral Oral Oral Oral  SpO2: 98% 97% 100% 96%  Weight:      Height:        General exam: Young female, moderately built and nourished lying comfortably propped up in bed without distress. Respiratory system: Clear to auscultation.  No increased work of breathing. Cardiovascular system: S1 & S2 heard, RRR. No JVD, murmurs, rubs, gallops or clicks. No right lower extremity pedal edema.  Telemetry personally reviewed: Sinus rhythm.  Discontinued telemetry. Gastrointestinal system: Abdomen is nondistended, soft and nontender. No organomegaly or masses felt. Normal bowel sounds heard. Central nervous system: Alert and oriented. No focal neurological deficits. Extremities: Symmetric 5 x 5 power.  Healed right transmetatarsal amputation stump.  Left BKA stump site with wound VAC and postop dressing clean and dry-unchanged and stable. Skin: No rashes, lesions or ulcers Psychiatry: Judgement and insight appear normal. Mood & affect appropriate.     Data Reviewed:   I have personally reviewed following labs and imaging studies   CBC: Recent Labs  Lab 10/24/23 0438 10/25/23 0526 10/26/23 0441  WBC 10.1 10.1 9.0  HGB 7.6* 7.6*  7.9*  HCT 24.8* 24.8* 25.2*  MCV 92.9 92.5 92.6  PLT 268 287 287    Basic Metabolic Panel: Recent Labs  Lab 10/21/23 1230 10/22/23 0218 10/23/23 0435 10/24/23 0438 10/25/23 0526  NA 134* 135 135 138 139  K 4.0 3.5 4.1 3.5 3.9  CL 104 107 109 110 108  CO2 21* 20* 22 21* 23  GLUCOSE 170* 153* 187* 115* 102*  BUN 19 19 19 19 13   CREATININE 1.25* 1.15* 1.07* 1.15* 0.99  CALCIUM  7.5* 7.5* 7.3* 7.6* 7.8*  MG 1.9  --   --   --  2.2    Liver Function Tests: Recent Labs  Lab 10/20/23 2200 10/22/23 0218 10/23/23 0435 10/24/23 0438  AST 81* 59* 35 50*  ALT 42 37 28 26  ALKPHOS 143* 170* 171* 176*  BILITOT 0.6 0.8 0.7 0.4  PROT 5.0* 5.0* 5.2* 5.3*  ALBUMIN  <1.5* <1.5* <1.5* <1.5*    CBG: Recent Labs  Lab 10/26/23 1700 10/26/23 2149 10/27/23 0817  GLUCAP 119* 110* 109*    Microbiology Studies:   Recent Results (from the past 240 hours)  Culture, blood (Routine X 2) w Reflex to ID Panel     Status: None   Collection Time: 10/20/23 10:00 PM   Specimen: BLOOD LEFT HAND  Result Value Ref Range Status   Specimen Description BLOOD LEFT HAND  Final   Special Requests  Final    BOTTLES DRAWN AEROBIC AND ANAEROBIC Blood Culture adequate volume   Culture   Final    NO GROWTH 5 DAYS Performed at [facility], [address], [place], [place] [address]    Report Status 10/25/2023 FINAL  Final  Culture, blood (Routine X 2) w Reflex to ID Panel     Status: None   Collection Time: 10/20/23 10:02 PM   Specimen: BLOOD LEFT ARM  Result Value Ref Range Status   Specimen Description BLOOD LEFT ARM  Final   Special Requests   Final    BOTTLES DRAWN AEROBIC AND ANAEROBIC Blood Culture adequate volume   Culture   Final    NO GROWTH 5 DAYS Performed at [facility], [address] [place], [place] [address]    Report Status 10/25/2023 FINAL  Final  Surgical pcr screen     Status: None   Collection Time: 10/21/23  9:19 PM   Specimen: Nasal Mucosa; Nasal Swab   Result Value Ref Range Status   MRSA, PCR NEGATIVE NEGATIVE Final   Staphylococcus aureus NEGATIVE NEGATIVE Final    Comment: (NOTE) The Xpert SA Assay (FDA approved for NASAL specimens in patients 54 years of age and older), is one component of a comprehensive surveillance program. It is not intended to diagnose infection nor to guide or monitor treatment. Performed at [facility], [address] [place], [place] [address]   Aerobic/Anaerobic Culture w Gram Stain (surgical/deep wound)     Status: None (Preliminary result)   Collection Time: 10/23/23 11:39 AM   Specimen: PATH Soft tissue  Result Value Ref Range Status   Specimen Description TISSUE  Final   Special Requests A  Final   Gram Stain NO WBC SEEN NO ORGANISMS SEEN   Final   Culture   Final    NO GROWTH 3 DAYS NO ANAEROBES ISOLATED; CULTURE IN PROGRESS FOR 5 DAYS Performed at [facility], [address], [place], [place] [address]    Report Status PENDING  Incomplete    Radiology Studies:  No results found.   Scheduled Meds:    atorvastatin   40 mg Oral Daily   bisacodyl   5 mg Oral Once   Chlorhexidine  Gluconate Cloth  6 each Topical Daily   clopidogrel   75 mg Oral Daily   enoxaparin  (LOVENOX ) injection  50 mg Subcutaneous Daily   ferrous sulfate   325 mg Oral Q breakfast   gabapentin   100 mg Oral TID   insulin  aspart  0-5 Units Subcutaneous QHS   insulin  aspart  0-6 Units Subcutaneous TID WC   insulin  glargine-yfgn  10 Units Subcutaneous QHS   levothyroxine   150 mcg Oral QAC breakfast   nystatin   Topical BID   pantoprazole   40 mg Oral BID   polyethylene glycol  17 g Oral BID   senna-docusate  1 tablet Oral BID   sodium chloride  flush  10-40 mL Intracatheter Q12H   sodium chloride  flush  10-40 mL Intracatheter Q12H    Continuous Infusions:    DAPTOmycin  700 mg (10/26/23 1521)   magnesium  sulfate bolus IVPB       LOS: 7 days     Trenda Mar, MD,  FACP, [facility], [facility],  [facility]   [facility] Ad[facility] [place]      To contact the attending provider between 7A-7P or the covering provider during after hours 7P-7A, please log into the web site [email or web address] and access using  universal [place] password for that web site. If you do not have the password, please call the hospital operator.  10/27/2023, 10:50 AM

## 2023-10-28 DIAGNOSIS — R7881 Bacteremia: Secondary | ICD-10-CM | POA: Diagnosis not present

## 2023-10-28 DIAGNOSIS — K5901 Slow transit constipation: Secondary | ICD-10-CM | POA: Diagnosis not present

## 2023-10-28 DIAGNOSIS — M726 Necrotizing fasciitis: Secondary | ICD-10-CM | POA: Diagnosis not present

## 2023-10-28 DIAGNOSIS — B9562 Methicillin resistant Staphylococcus aureus infection as the cause of diseases classified elsewhere: Secondary | ICD-10-CM | POA: Diagnosis not present

## 2023-10-28 LAB — HEMOGLOBIN A1C
Hgb A1c MFr Bld: 5.5 % (ref 4.8–5.6)
Mean Plasma Glucose: 111.15 mg/dL

## 2023-10-28 LAB — CK: Total CK: 193 U/L (ref 38–234)

## 2023-10-28 LAB — GLUCOSE, CAPILLARY
Glucose-Capillary: 123 mg/dL — ABNORMAL HIGH (ref 70–99)
Glucose-Capillary: 163 mg/dL — ABNORMAL HIGH (ref 70–99)
Glucose-Capillary: 125 mg/dL — ABNORMAL HIGH (ref 70–99)
Glucose-Capillary: 136 mg/dL — ABNORMAL HIGH (ref 70–99)

## 2023-10-28 LAB — AEROBIC/ANAEROBIC CULTURE W GRAM STAIN (SURGICAL/DEEP WOUND)
Culture: NO GROWTH
Gram Stain: NONE SEEN

## 2023-10-28 MED ORDER — INSULIN ASPART 100 UNIT/ML IJ SOLN
0.0000 [IU] | Freq: Three times a day (TID) | INTRAMUSCULAR | Status: DC
  Administered 2023-10-29: 2 [IU] via SUBCUTANEOUS

## 2023-10-28 MED ORDER — INSULIN ASPART 100 UNIT/ML IJ SOLN: 0.0000 [IU] | Freq: Every day | INTRAMUSCULAR | Status: DC

## 2023-10-28 MED ORDER — BISACODYL 10 MG RE SUPP: 10.0000 mg | Freq: Every day | RECTAL | Status: DC | PRN

## 2023-10-28 NOTE — Progress Notes (Signed)
 Patient was seen and examined as a social visit.  Mother was at bedside. She appears to be doing well, with limited pain at her amputation site.  Her mental status has improved dramatically since I saw her earlier in the week when she required open below-knee amputation.  Dr. Harden was happy with the formal closure the following day.  Will defer further management to Dr. Harden.  Fonda FORBES Rim MD

## 2023-10-28 NOTE — Progress Notes (Addendum)
 Inpatient Rehab Admissions Coordinator:  Insurance authorization started. Will continue to follow.   Updated pt and mother at bedside. Also reviewed benefits.  Tinnie Yvone Cohens, MS, CCC-SLP Admissions Coordinator [phone]

## 2023-10-28 NOTE — Progress Notes (Signed)
 PROGRESS NOTE   Kayla Schmitt  FMW:[id number]    DOB: [birth year]    DOA: 10/20/2023  PCP: [name], NP   I have briefly reviewed patients previous medical records in [facility].   Brief Hospital Course:  58 y.o. 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  Sepsis protocol was initiated and she was started on vancomycin  and cefepime .  Blood cultures grew gram-positive cocci but no clear source of infection.  CT of chest abdomen and pelvis without clear abnormality.  Venous Doppler negative for DVT.  She was noted to have purple spotty area of discoloration of left leg.  She was then transferred to [facility] for vascular surgery consult. At [facility] patient has been treated for sepsis with endorgan dysfunction including elevated lactic acid and AKI, peripheral vascular disease with increased pain of the left lower extremity, AKI, altered mental status, DM type II and elevated troponin secondary to demand ischemia. Patient also received 1 unit of blood transfusion as hemoglobin dropped 8.2-7. No GI bleed at outside hospital.   Stable for DC to CIR since 6/28.  Since then awaiting formal rehab consult, rehab bed and insurance authorization.  Delays noted.   Assessment & Plan:   Necrotizing fasciitis of left lower extremity/PAD  Vascular surgery and orthopedics were consulted She has history of bilateral transmetatarsal amputations and previous bilateral lower extremity endovascular revascularization. S/p left BKA with wound VAC placement on 6/24 by Dr. Fonda Rim S/p left revision BKA, on 6/25 by Dr. Harden.  Orthopedic follow-up appreciated.  Indicate  VAC will remain for 1 week until follow-up. Dr. Harden follow-up 6/30 appreciated.  Per patient, wound VAC to stay.  MRSA bacteremia Surgical intervention on left lower extremity as noted above. ID managing antimicrobial treatment Vancomycin  switched to daptomycin .  CK being followed while on daptomycin , 193/normal on 6/30. TEE 6/26 preliminary result without vegetations. Surveillance blood cultures x 2 from 6/22, negative, final. Surgical tissue 6/25: No growth to date/4 days. As per ID sign off on 6/27: Continue current dose of daptomycin  through 7/20, therapeutic drug monitoring of CK while on daptomycin .They will arrange outpatient follow-up in ID clinic. PICC line placed 6/28.  Acute kidney injury Suspecting prerenal in the setting of sepsis and hypotension Resolved.  Avoid nephrotoxics and hypotension.  Elevated troponin secondary to demand ischemia/history of CAD Outside facility troponin, 696 > 576 > 532.  No anginal symptoms or acute EKG changes. TTE: LVEF 50-55%, grade 2 diastolic dysfunction. Was on IV heparin  drip for PAD indication which was discontinued on 6/24. Continue atorvastatin  and Plavix .  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 Patient received a unit of PRBC at outside hospital. Follow CBCs daily and transfuse for hemoglobin 7 g or less Hemoglobin 7.6 >7.6 >7.9, stable.  Insulin -dependent type 2 DM Good inpatient control on current dose of Semglee  10 units at bedtime and NovoLog  SSI Periodic fluctuations. Had CBG of 351 this morning which was the only aberrancy otherwise CBGs mostly in the 110-140 range.  Monitor.  Chronic combined systolic and diastolic CHF EF appears to have improved compared to prior.  Clinically euvolemic.  Hypertension Controlled. Monitoring off of antihypertensives  Prolonged QT Minimize QT prolonging drugs Last EKG on 6/22 showed QTc  of 486 ms.  Hypothyroidism Continue with levothyroxine .  Constipation No BM since 6/23.  Passing flatus.  No abdominal pain. Adjusted bowel regimen.  MiraLAX  17 g twice daily, Senokot S, 1 tablet twice daily.  Dulcolax 5 Mg p.o. x 1.  Mobilize. Has not had a BM yet.  Feels like she may have 1 today.  Added as needed Dulcolax suppository.  Body mass index is 39.66 kg/m.   DVT prophylaxis: SCD's Start: 10/23/23 1248     Code Status: Full Code:  Family Communication: None at bedside today. Disposition:  Stable for DC to CIR.   Consultants:   Vascular surgery Orthopedics Infectious disease Cardiology  Procedures:   As above  Subjective:  Denies complaints.  No pain reported.  Passing flatus.  No BM yet.  Objective:   Vitals:   10/27/23 2112 10/27/23 2357 10/28/23 0017 10/28/23 0427  BP: (!) 136/59 127/63  (!) 136/55  Pulse: 72 74  78  Resp:    18  Temp:  98 F (36.7 C)  98.8 F (37.1 C)  TempSrc:  Oral    SpO2: 97% 98%  98%  Weight:   104.8 kg   Height:        General exam: Young female, moderately built and nourished lying comfortably propped up in bed without distress.  Appears to be in good spirits. Respiratory system: Clear to auscultation.  No increased work of breathing. Cardiovascular system: S1 & S2 heard, RRR. No JVD, murmurs, rubs, gallops or clicks. No right lower extremity pedal edema.  Off of telemetry. Gastrointestinal system: Abdomen is nondistended, soft and nontender. No organomegaly or masses felt. Normal bowel sounds heard. Central nervous system: Alert and oriented. No focal neurological deficits. Extremities: Symmetric 5 x 5 power.  Healed right transmetatarsal amputation stump.  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  0 drain output documented in the last 24 hours. Skin: No rashes, lesions or ulcers Psychiatry: Judgement and insight appear normal. Mood & affect appropriate.      Data Reviewed:   I have personally reviewed following labs and imaging studies   CBC: Recent Labs  Lab 10/24/23 0438 10/25/23 0526 10/26/23 0441  WBC 10.1 10.1 9.0  HGB 7.6* 7.6* 7.9*  HCT 24.8* 24.8* 25.2*  MCV 92.9 92.5 92.6  PLT 268 287 287    Basic Metabolic Panel: Recent Labs  Lab 10/21/23 1230 10/22/23 0218 10/23/23 0435 10/24/23 0438 10/25/23 0526  NA 134* 135 135 138 139  K 4.0 3.5 4.1 3.5 3.9  CL 104 107 109 110 108  CO2 21* 20* 22 21* 23  GLUCOSE 170* 153* 187* 115* 102*  BUN 19 19 19 19 13   CREATININE 1.25* 1.15* 1.07* 1.15* 0.99  CALCIUM  7.5* 7.5* 7.3* 7.6* 7.8*  MG 1.9  --   --   --  2.2    Liver Function Tests: Recent Labs  Lab 10/22/23 0218 10/23/23 0435 10/24/23 0438  AST 59* 35 50*  ALT 37 28 26  ALKPHOS 170* 171* 176*  BILITOT 0.8 0.7 0.4  PROT 5.0* 5.2* 5.3*  ALBUMIN  <1.5* <1.5* <1.5*    CBG: Recent Labs  Lab 10/27/23 2114 10/27/23 2230 10/28/23 0756  GLUCAP 151* 121* 123*    Microbiology Studies:   Recent Results (from the past 240 hours)  Culture, blood (Routine X 2) w Reflex to ID Panel  Status: None   Collection Time: 10/20/23 10:00 PM   Specimen: BLOOD LEFT HAND  Result Value Ref Range Status   Specimen Description BLOOD LEFT HAND  Final   Special Requests   Final    BOTTLES DRAWN AEROBIC AND ANAEROBIC Blood Culture adequate volume   Culture   Final    NO GROWTH 5 DAYS Performed at [facility], [address] [place], [place] [address]    Report Status 10/25/2023 FINAL  Final  Culture, blood (Routine X 2) w Reflex to ID Panel     Status: None   Collection Time: 10/20/23 10:02 PM   Specimen: BLOOD LEFT ARM  Result Value Ref Range Status   Specimen Description BLOOD LEFT ARM  Final   Special Requests   Final    BOTTLES DRAWN AEROBIC AND ANAEROBIC Blood Culture adequate volume   Culture   Final    NO GROWTH 5 DAYS Performed at [facility], [address], [place], [place] [address]     Report Status 10/25/2023 FINAL  Final  Surgical pcr screen     Status: None   Collection Time: 10/21/23  9:19 PM   Specimen: Nasal Mucosa; Nasal Swab  Result Value Ref Range Status   MRSA, PCR NEGATIVE NEGATIVE Final   Staphylococcus aureus NEGATIVE NEGATIVE Final    Comment: (NOTE) The Xpert SA Assay (FDA approved for NASAL specimens in patients 54 years of age and older), is one component of a comprehensive surveillance program. It is not intended to diagnose infection nor to guide or monitor treatment. Performed at [facility], [address], [place], [place] [address]   Aerobic/Anaerobic Culture w Gram Stain (surgical/deep wound)     Status: None (Preliminary result)   Collection Time: 10/23/23 11:39 AM   Specimen: PATH Soft tissue  Result Value Ref Range Status   Specimen Description TISSUE  Final   Special Requests A  Final   Gram Stain NO WBC SEEN NO ORGANISMS SEEN   Final   Culture   Final    NO GROWTH 4 DAYS NO ANAEROBES ISOLATED; CULTURE IN PROGRESS FOR 5 DAYS Performed at [facility], [address], [place], [place] [address]    Report Status PENDING  Incomplete    Radiology Studies:  No results found.   Scheduled Meds:    atorvastatin   40 mg Oral Daily   Chlorhexidine  Gluconate Cloth  6 each Topical Daily   clopidogrel   75 mg Oral Daily   enoxaparin  (LOVENOX ) injection  50 mg Subcutaneous Daily   ferrous sulfate   325 mg Oral Q breakfast   gabapentin   100 mg Oral TID   insulin  aspart  0-5 Units Subcutaneous QHS   insulin  aspart  0-6 Units Subcutaneous TID WC   insulin  glargine-yfgn  10 Units Subcutaneous QHS   levothyroxine   150 mcg Oral QAC breakfast   nystatin   Topical BID   pantoprazole   40 mg Oral BID   polyethylene glycol  17 g Oral BID   senna-docusate  1 tablet Oral BID   sodium chloride  flush  10-40 mL Intracatheter Q12H    Continuous Infusions:    DAPTOmycin  700 mg (10/27/23 1345)   magnesium  sulfate bolus IVPB        LOS: 8 days     Trenda Mar, MD,  FACP, [facility], [facility], [facility]   [facility] [place]      To contact the attending provider between 7A-7P  or the covering provider during after hours 7P-7A, please log into the web site [email or web address] and access using universal [place] password for that web site. If you do not have the password, please call the hospital operator.  10/28/2023, 8:13 AM

## 2023-10-28 NOTE — Progress Notes (Signed)
 Patient ID: Kayla Schmitt, female   DOB: 01/09/1966, 58 y.o.   MRN: [id number] Patient is a 58 year old woman who is status post revision left below-knee amputation for necrotizing fasciitis.  There is no drainage in the wound VAC canister there is a good suction fit.  Anticipate discharge to inpatient rehab tomorrow.

## 2023-10-28 NOTE — Progress Notes (Signed)
 Occupational Therapy Treatment Patient Details Name: Kayla Schmitt MRN: [id number] DOB: 04/05/1966 Today's Date: 10/28/2023   History of present illness Pt is a 58 y.o female admitted 6/22 for lethargy & LLE pain. Sepsis protocol initiated. Negative for osteomyelitis & DVT. 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   OT comments  Pt progressing toward goals this session, provided pt with drop arm 3in1 for toileting. Pt mod +2 for squat pivot transfer from bed > BSC > chair with 2 person HHA. Pt mod I for bed mobility with use of rails. Pt able to complete pericare via lateral leans on BSC. Pt presenting with impairments listed below, will follow acutely. Patient will benefit from intensive inpatient follow-up therapy, >3 hours/day to maximize safety/ind with ADL/functional mobility.        If plan is discharge home, recommend the following:  A lot of help with walking and/or transfers;A lot of help with bathing/dressing/bathroom   Equipment Recommendations  Other (comment) (defer)    Recommendations for Other Services Rehab consult    Precautions / Restrictions Precautions Precautions: Fall Recall of Precautions/Restrictions: Intact Precaution/Restrictions Comments: L BKA residual limb protector and wound vax Restrictions Weight Bearing Restrictions Per Provider Order: Yes LLE Weight Bearing Per Provider Order: Non weight bearing       Mobility Bed Mobility Overal bed mobility: Needs Assistance Bed Mobility: Sit to Supine, Supine to Sit     Supine to sit: HOB elevated, Used rails, Modified independent (Device/Increase time)          Transfers Overall transfer level: Needs assistance Equipment used: Rolling walker (2 wheels), 2 person hand held assist Transfers: Sit to/from Stand, Bed to  chair/wheelchair/BSC Sit to Stand: Mod assist, +2 physical assistance Stand pivot transfers: +2 physical assistance, Mod assist               Balance Overall balance assessment: Needs assistance Sitting-balance support: No upper extremity supported, Feet supported Sitting balance-Leahy Scale: Good     Standing balance support: During functional activity, Reliant on assistive device for balance Standing balance-Leahy Scale: Zero                             ADL either performed or assessed with clinical judgement   ADL Overall ADL's : Needs assistance/impaired Eating/Feeding: Set up;Sitting   Grooming: Set up;Sitting                   Toilet Transfer: Moderate assistance;+2 for physical assistance;Squat-pivot   Toileting- Clothing Manipulation and Hygiene: Contact guard assist       Functional mobility during ADLs: Moderate assistance;+2 for physical assistance      Extremity/Trunk Assessment Upper Extremity Assessment Upper Extremity Assessment: Generalized weakness   Lower Extremity Assessment Lower Extremity Assessment: Defer to PT evaluation        Vision   Vision Assessment?: No apparent visual deficits   Perception Perception Perception: Not tested   Praxis Praxis Praxis: Not tested   Communication Communication Communication: No apparent difficulties   Cognition Arousal: Alert Behavior During Therapy: WFL for tasks assessed/performed Cognition: No apparent impairments                               Following commands: Intact  Cueing   Cueing Techniques: Verbal cues  Exercises      Shoulder Instructions       General Comments VSS on RA    Pertinent Vitals/ Pain       Pain Assessment Pain Assessment: Faces Pain Score: 6  Faces Pain Scale: Hurts even more Pain Location: LLE Pain Descriptors / Indicators: Discomfort, Grimacing, Guarding, Operative site guarding Pain Intervention(s): Limited  activity within patient's tolerance, Monitored during session, Repositioned  Home Living                                          Prior Functioning/Environment              Frequency  Min 2X/week        Progress Toward Goals  OT Goals(current goals can now be found in the care plan section)  Progress towards OT goals: Progressing toward goals  Acute Rehab OT Goals Patient Stated Goal: none stated OT Goal Formulation: With patient Time For Goal Achievement: 11/07/23 Potential to Achieve Goals: Good ADL Goals Pt Will Perform Lower Body Dressing: with supervision;sit to/from stand Pt Will Transfer to Toilet: with supervision;stand pivot transfer Pt Will Perform Toileting - Clothing Manipulation and hygiene: with supervision;sit to/from stand  Plan      Co-evaluation    PT/OT/SLP Co-Evaluation/Treatment: Yes Reason for Co-Treatment: For patient/therapist safety;To address functional/ADL transfers PT goals addressed during session: Mobility/safety with mobility OT goals addressed during session: ADL's and self-care      AM-PAC OT 6 Clicks Daily Activity     Outcome Measure   Help from another person eating meals?: None Help from another person taking care of personal grooming?: A Little Help from another person toileting, which includes using toliet, bedpan, or urinal?: A Lot Help from another person bathing (including washing, rinsing, drying)?: A Lot Help from another person to put on and taking off regular upper body clothing?: A Little Help from another person to put on and taking off regular lower body clothing?: A Lot 6 Click Score: 16    End of Session Equipment Utilized During Treatment: Gait belt;Rolling walker (2 wheels)  OT Visit Diagnosis: Unsteadiness on feet (R26.81);Other abnormalities of gait and mobility (R26.89);Muscle weakness (generalized) (M62.81)   Activity Tolerance Patient tolerated treatment well   Patient Left in  bed;with call bell/phone within reach;with family/visitor present   Nurse Communication Mobility status        Time: 9090-9047 OT Time Calculation (min): 43 min  Charges: OT General Charges $OT Visit: 1 Visit OT Treatments $Self Care/Home Management : 8-22 mins $Therapeutic Activity: 8-22 mins  Shalawn Wynder K, OTD, OTR/L SecureChat Preferred Acute Rehab [phone]   Laneta POUR Koonce 10/28/2023, 10:43 AM

## 2023-10-28 NOTE — Plan of Care (Signed)
  Problem: Education: Goal: Knowledge of General Education information will improve Description: Including pain rating scale, medication(s)/side effects and non-pharmacologic comfort measures Outcome: Progressing   Problem: Health Behavior/Discharge Planning: Goal: Ability to manage health-related needs will improve Outcome: Progressing   Problem: Clinical Measurements: Goal: Ability to maintain clinical measurements within normal limits will improve Outcome: Progressing Goal: Will remain free from infection Outcome: Progressing Goal: Diagnostic test results will improve Outcome: Progressing Goal: Respiratory complications will improve Outcome: Progressing Goal: Cardiovascular complication will be avoided Outcome: Progressing   Problem: Activity: Goal: Risk for activity intolerance will decrease Outcome: Progressing   Problem: Nutrition: Goal: Adequate nutrition will be maintained Outcome: Progressing   Problem: Coping: Goal: Level of anxiety will decrease Outcome: Progressing   Problem: Elimination: Goal: Will not experience complications related to bowel motility Outcome: Progressing Goal: Will not experience complications related to urinary retention Outcome: Progressing   Problem: Pain Managment: Goal: General experience of comfort will improve and/or be controlled Outcome: Progressing   Problem: Safety: Goal: Ability to remain free from injury will improve Outcome: Progressing   Problem: Skin Integrity: Goal: Risk for impaired skin integrity will decrease Outcome: Progressing   Problem: Education: Goal: Ability to describe self-care measures that may prevent or decrease complications (Diabetes Survival Skills Education) will improve Outcome: Progressing Goal: Individualized Educational Video(s) Outcome: Progressing   Problem: Coping: Goal: Ability to adjust to condition or change in health will improve Outcome: Progressing   Problem: Coping: Goal:  Ability to adjust to condition or change in health will improve Outcome: Progressing   Problem: Fluid Volume: Goal: Ability to maintain a balanced intake and output will improve Outcome: Progressing   Problem: Health Behavior/Discharge Planning: Goal: Ability to identify and utilize available resources and services will improve Outcome: Progressing Goal: Ability to manage health-related needs will improve Outcome: Progressing   Problem: Metabolic: Goal: Ability to maintain appropriate glucose levels will improve Outcome: Progressing   Problem: Nutritional: Goal: Maintenance of adequate nutrition will improve Outcome: Progressing Goal: Progress toward achieving an optimal weight will improve Outcome: Progressing   Problem: Skin Integrity: Goal: Risk for impaired skin integrity will decrease Outcome: Progressing   Problem: Tissue Perfusion: Goal: Adequacy of tissue perfusion will improve Outcome: Progressing   Problem: Fluid Volume: Goal: Hemodynamic stability will improve Outcome: Progressing   Problem: Clinical Measurements: Goal: Diagnostic test results will improve Outcome: Progressing Goal: Signs and symptoms of infection will decrease Outcome: Progressing   Problem: Respiratory: Goal: Ability to maintain adequate ventilation will improve Outcome: Progressing   Problem: Education: Goal: Knowledge of the prescribed therapeutic regimen will improve Outcome: Progressing Goal: Ability to verbalize activity precautions or restrictions will improve Outcome: Progressing Goal: Understanding of discharge needs will improve Outcome: Progressing   Problem: Activity: Goal: Ability to perform//tolerate increased activity and mobilize with assistive devices will improve Outcome: Progressing   Problem: Clinical Measurements: Goal: Postoperative complications will be avoided or minimized Outcome: Progressing   Problem: Self-Care: Goal: Ability to meet self-care needs  will improve Outcome: Progressing   Problem: Self-Concept: Goal: Ability to maintain and perform role responsibilities to the fullest extent possible will improve Outcome: Progressing   Problem: Pain Management: Goal: Pain level will decrease with appropriate interventions Outcome: Progressing   Problem: Pain Management: Goal: Pain level will decrease with appropriate interventions Outcome: Progressing   Problem: Pain Management: Goal: Pain level will decrease with appropriate interventions Outcome: Progressing   Problem: Skin Integrity: Goal: Demonstration of wound healing without infection will improve Outcome: Progressing

## 2023-10-28 NOTE — Progress Notes (Signed)
 Physical Therapy Treatment Patient Details Name: Kayla Schmitt MRN: [id number] DOB: Oct 26, 1965 Today's Date: 10/28/2023   History of Present Illness Pt is a 58 y.o female admitted 6/22 for lethargy & LLE pain. Sepsis protocol initiated. Negative for osteomyelitis & DVT. 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    PT Comments  Pt awake in bed on entry, requests pain medication prior to therapy. RN able to provide as patient was sitting EoB. Pt is limited in safe mobility by increased pain with R LE in dependent position, change in her CoG, and associated decreased balance. Pt was able to come to seated EoB with modified independence. Once there she is able to don her R shoe. Pt requires mod Ax2 for stand pivot transfer from bed to [facility] and then from [facility] to drop arm recliner. Pt is very motivated to return to prior level of independence and has 24 hour support at home from her mother. Patient will benefit from intensive inpatient follow-up therapy, >3 hours/day to return to her previous mod I mobility. PT will continue to follow acutely.     If plan is discharge home, recommend the following: A lot of help with walking and/or transfers;A lot of help with bathing/dressing/bathroom;Assistance with cooking/housework;Assist for transportation;Help with stairs or ramp for entrance   Can travel by private vehicle      Yes   Equipment Recommendations  None recommended by PT    Recommendations for Other Services Rehab consult     Precautions / Restrictions Precautions Precautions: Fall Recall of Precautions/Restrictions: Intact Precaution/Restrictions Comments: L BKA residual limb protector and wound vax Restrictions Weight Bearing Restrictions Per Provider Order: Yes LLE Weight Bearing Per Provider Order: Non weight bearing      Mobility  Bed Mobility Overal bed mobility: Needs Assistance Bed Mobility: Sit to Supine, Supine to Sit     Supine to sit: HOB elevated, Used rails, Modified independent (Device/Increase time)     General bed mobility comments: pt able to pull herself into long sitting and then scoot herself to the EoB where se was able to don her    Transfers Overall transfer level: Needs assistance Equipment used: Rolling walker (2 wheels), 2 person hand held assist Transfers: Sit to/from Stand, Bed to chair/wheelchair/BSC Sit to Stand: Mod assist, +2 physical assistance Stand pivot transfers: +2 physical assistance, Mod assist         General transfer comment: mod Ax2 for power up to standing with pt holding onto therapists arms, once in standing, Pt able to remove her L hand from therapist and reach towards arm rest of BSC to pivot and sit. After finished on BSC pt able to pivot to recliner with modAx2       Balance Overall balance assessment: Needs assistance Sitting-balance support: No upper extremity supported, Feet supported Sitting balance-Leahy Scale: Good     Standing balance support: During functional activity, Reliant on assistive device for balance Standing balance-Leahy Scale: Zero                              Communication Communication Communication: No apparent difficulties  Cognition Arousal: Alert Behavior During Therapy: [facility] for tasks assessed/performed  Following commands: Intact      Cueing Cueing Techniques: Verbal cues     General Comments General comments (skin integrity, edema, etc.): Pt reports she needs pain medication prior to session, RN able to provide prior to mobilization. VSS on RA      Pertinent Vitals/Pain Pain Assessment Pain Assessment: Faces Faces Pain Scale: Hurts even more Breathing: normal Negative Vocalization: none Facial Expression: smiling or inexpressive Body Language:  relaxed Consolability: no need to console PAINAD Score: 0 Pain Location: LLE Pain Descriptors / Indicators: Discomfort, Grimacing, Guarding, Operative site guarding Pain Intervention(s): Limited activity within patient's tolerance, Monitored during session, Repositioned, RN gave pain meds during session     PT Goals (current goals can now be found in the care plan section) Acute Rehab PT Goals PT Goal Formulation: With patient/family Time For Goal Achievement: 11/07/23 Potential to Achieve Goals: Good Progress towards PT goals: Progressing toward goals    Frequency    Min 3X/week           Co-evaluation PT/OT/SLP Co-Evaluation/Treatment: Yes Reason for Co-Treatment: For patient/therapist safety;To address functional/ADL transfers PT goals addressed during session: Mobility/safety with mobility OT goals addressed during session: ADL's and self-care      AM-PAC PT 6 Clicks Mobility   Outcome Measure  Help needed turning from your back to your side while in a flat bed without using bedrails?: None Help needed moving from lying on your back to sitting on the side of a flat bed without using bedrails?: A Little Help needed moving to and from a bed to a chair (including a wheelchair)?: A Little Help needed standing up from a chair using your arms (e.g., wheelchair or bedside chair)?: Total Help needed to walk in hospital room?: Total Help needed climbing 3-5 steps with a railing? : Total 6 Click Score: 13    End of Session Equipment Utilized During Treatment: Gait belt Activity Tolerance: Patient tolerated treatment well Patient left: with call bell/phone within reach;in chair Nurse Communication: Mobility status PT Visit Diagnosis: Unsteadiness on feet (R26.81);Other abnormalities of gait and mobility (R26.89);Muscle weakness (generalized) (M62.81);Difficulty in walking, not elsewhere classified (R26.2)     Time: 9093-9049 PT Time Calculation (min) (ACUTE ONLY): 44  min  Charges:    $Therapeutic Activity: 8-22 mins PT General Charges $$ ACUTE PT VISIT: 1 Visit                     Bemnet Trovato B. Fleeta Lapidus PT, DPT Acute Rehabilitation Services Please use secure chat or  Call Office [phone]    Almarie KATHEE Fleeta Fleet 10/28/2023, 10:05 AM

## 2023-10-28 NOTE — Inpatient Diabetes Management (Signed)
 Inpatient Diabetes Program Recommendations  AACE/ADA: New Consensus Statement on Inpatient Glycemic Control (2015)  Target Ranges:  Prepandial:   less than 140 mg/dL      Peak postprandial:   less than 180 mg/dL (1-2 hours)      Critically ill patients:  140 - 180 mg/dL   Lab Results  Component Value Date   GLUCAP 123 (H) 10/28/2023   HGBA1C 6.4 (H) 08/02/2022    Review of Glycemic Control  Latest Reference Range & Units 10/27/23 08:17 10/27/23 12:53 10/27/23 17:34 10/27/23 21:14 10/27/23 22:30 10/28/23 07:56  Glucose-Capillary 70 - 99 mg/dL 890 (H) 622 (H) 93 848 (H) 121 (H) 123 (H)   Diabetes history: DM 2 Outpatient Diabetes medications:  Tresiba  16 units q HS Current orders for Inpatient glycemic control:  Semglee  10 units daily Novolog  0-6 units tid with meals and HS Inpatient Diabetes Program Recommendations:    Consider increasing Novolog  correction to sensitive (0-9 units) tid with meals.   Thanks,  Randall Bullocks, RN, BC-ADM Inpatient Diabetes Coordinator Pager [phone]  (8a-5p)

## 2023-10-29 ENCOUNTER — Other Ambulatory Visit: Payer: Self-pay

## 2023-10-29 ENCOUNTER — Inpatient Hospital Stay (HOSPITAL_COMMUNITY)
Admission: AD | Admit: 2023-10-29 | Discharge: 2023-11-12 | DRG: 559 | Disposition: A | Discharge status: Other Acute Inpatient Hospital | Source: Intra-hospital | Attending: Physical Medicine and Rehabilitation | Admitting: Physical Medicine and Rehabilitation

## 2023-10-29 ENCOUNTER — Encounter (HOSPITAL_COMMUNITY): Payer: Self-pay | Admitting: Physical Medicine and Rehabilitation

## 2023-10-29 DIAGNOSIS — R0902 Hypoxemia: Secondary | ICD-10-CM | POA: Diagnosis not present

## 2023-10-29 DIAGNOSIS — I2489 Other forms of acute ischemic heart disease: Secondary | ICD-10-CM | POA: Diagnosis present

## 2023-10-29 DIAGNOSIS — Z8249 Family history of ischemic heart disease and other diseases of the circulatory system: Secondary | ICD-10-CM

## 2023-10-29 DIAGNOSIS — E43 Unspecified severe protein-calorie malnutrition: Secondary | ICD-10-CM | POA: Diagnosis present

## 2023-10-29 DIAGNOSIS — Z4781 Encounter for orthopedic aftercare following surgical amputation: Secondary | ICD-10-CM | POA: Diagnosis not present

## 2023-10-29 DIAGNOSIS — Z89512 Acquired absence of left leg below knee: Secondary | ICD-10-CM | POA: Diagnosis present

## 2023-10-29 DIAGNOSIS — Z89431 Acquired absence of right foot: Secondary | ICD-10-CM | POA: Diagnosis not present

## 2023-10-29 DIAGNOSIS — I051 Rheumatic mitral insufficiency: Secondary | ICD-10-CM | POA: Diagnosis not present

## 2023-10-29 DIAGNOSIS — R5381 Other malaise: Secondary | ICD-10-CM | POA: Diagnosis present

## 2023-10-29 DIAGNOSIS — R7881 Bacteremia: Secondary | ICD-10-CM | POA: Diagnosis present

## 2023-10-29 DIAGNOSIS — D509 Iron deficiency anemia, unspecified: Secondary | ICD-10-CM | POA: Diagnosis not present

## 2023-10-29 DIAGNOSIS — I10 Essential (primary) hypertension: Secondary | ICD-10-CM | POA: Diagnosis not present

## 2023-10-29 DIAGNOSIS — E1151 Type 2 diabetes mellitus with diabetic peripheral angiopathy without gangrene: Secondary | ICD-10-CM | POA: Diagnosis present

## 2023-10-29 DIAGNOSIS — I350 Nonrheumatic aortic (valve) stenosis: Secondary | ICD-10-CM | POA: Diagnosis present

## 2023-10-29 DIAGNOSIS — K5521 Angiodysplasia of colon with hemorrhage: Secondary | ICD-10-CM | POA: Diagnosis present

## 2023-10-29 DIAGNOSIS — N189 Chronic kidney disease, unspecified: Secondary | ICD-10-CM | POA: Diagnosis not present

## 2023-10-29 DIAGNOSIS — E039 Hypothyroidism, unspecified: Secondary | ICD-10-CM | POA: Diagnosis present

## 2023-10-29 DIAGNOSIS — I5043 Acute on chronic combined systolic (congestive) and diastolic (congestive) heart failure: Secondary | ICD-10-CM | POA: Diagnosis not present

## 2023-10-29 DIAGNOSIS — I080 Rheumatic disorders of both mitral and aortic valves: Secondary | ICD-10-CM | POA: Diagnosis present

## 2023-10-29 DIAGNOSIS — I5031 Acute diastolic (congestive) heart failure: Secondary | ICD-10-CM | POA: Diagnosis not present

## 2023-10-29 DIAGNOSIS — E871 Hypo-osmolality and hyponatremia: Secondary | ICD-10-CM | POA: Diagnosis not present

## 2023-10-29 DIAGNOSIS — R2689 Other abnormalities of gait and mobility: Secondary | ICD-10-CM | POA: Diagnosis present

## 2023-10-29 DIAGNOSIS — R310 Gross hematuria: Secondary | ICD-10-CM | POA: Diagnosis not present

## 2023-10-29 DIAGNOSIS — D649 Anemia, unspecified: Secondary | ICD-10-CM | POA: Diagnosis present

## 2023-10-29 DIAGNOSIS — G546 Phantom limb syndrome with pain: Secondary | ICD-10-CM | POA: Diagnosis present

## 2023-10-29 DIAGNOSIS — Z87891 Personal history of nicotine dependence: Secondary | ICD-10-CM

## 2023-10-29 DIAGNOSIS — I110 Hypertensive heart disease with heart failure: Secondary | ICD-10-CM | POA: Diagnosis present

## 2023-10-29 DIAGNOSIS — N390 Urinary tract infection, site not specified: Secondary | ICD-10-CM | POA: Diagnosis not present

## 2023-10-29 DIAGNOSIS — N1831 Chronic kidney disease, stage 3a: Secondary | ICD-10-CM | POA: Diagnosis not present

## 2023-10-29 DIAGNOSIS — L8990 Pressure ulcer of unspecified site, unspecified stage: Secondary | ICD-10-CM | POA: Insufficient documentation

## 2023-10-29 DIAGNOSIS — E7800 Pure hypercholesterolemia, unspecified: Secondary | ICD-10-CM | POA: Diagnosis present

## 2023-10-29 DIAGNOSIS — R338 Other retention of urine: Secondary | ICD-10-CM | POA: Diagnosis not present

## 2023-10-29 DIAGNOSIS — R319 Hematuria, unspecified: Secondary | ICD-10-CM | POA: Diagnosis not present

## 2023-10-29 DIAGNOSIS — E1169 Type 2 diabetes mellitus with other specified complication: Secondary | ICD-10-CM | POA: Diagnosis not present

## 2023-10-29 DIAGNOSIS — D500 Iron deficiency anemia secondary to blood loss (chronic): Secondary | ICD-10-CM | POA: Diagnosis present

## 2023-10-29 DIAGNOSIS — I252 Old myocardial infarction: Secondary | ICD-10-CM | POA: Diagnosis not present

## 2023-10-29 DIAGNOSIS — Z9981 Dependence on supplemental oxygen: Secondary | ICD-10-CM

## 2023-10-29 DIAGNOSIS — Z6837 Body mass index (BMI) 37.0-37.9, adult: Secondary | ICD-10-CM | POA: Diagnosis not present

## 2023-10-29 DIAGNOSIS — Z7189 Other specified counseling: Secondary | ICD-10-CM | POA: Diagnosis not present

## 2023-10-29 DIAGNOSIS — J189 Pneumonia, unspecified organism: Secondary | ICD-10-CM | POA: Diagnosis not present

## 2023-10-29 DIAGNOSIS — Z515 Encounter for palliative care: Secondary | ICD-10-CM | POA: Diagnosis not present

## 2023-10-29 DIAGNOSIS — Z888 Allergy status to other drugs, medicaments and biological substances status: Secondary | ICD-10-CM

## 2023-10-29 DIAGNOSIS — I5023 Acute on chronic systolic (congestive) heart failure: Secondary | ICD-10-CM | POA: Diagnosis not present

## 2023-10-29 DIAGNOSIS — Z95820 Peripheral vascular angioplasty status with implants and grafts: Secondary | ICD-10-CM

## 2023-10-29 DIAGNOSIS — K219 Gastro-esophageal reflux disease without esophagitis: Secondary | ICD-10-CM | POA: Diagnosis present

## 2023-10-29 DIAGNOSIS — R339 Retention of urine, unspecified: Secondary | ICD-10-CM | POA: Diagnosis not present

## 2023-10-29 DIAGNOSIS — Z8719 Personal history of other diseases of the digestive system: Secondary | ICD-10-CM

## 2023-10-29 DIAGNOSIS — Z7902 Long term (current) use of antithrombotics/antiplatelets: Secondary | ICD-10-CM

## 2023-10-29 DIAGNOSIS — S88112S Complete traumatic amputation at level between knee and ankle, left lower leg, sequela: Secondary | ICD-10-CM

## 2023-10-29 DIAGNOSIS — J9601 Acute respiratory failure with hypoxia: Secondary | ICD-10-CM | POA: Diagnosis not present

## 2023-10-29 DIAGNOSIS — I255 Ischemic cardiomyopathy: Secondary | ICD-10-CM | POA: Diagnosis not present

## 2023-10-29 DIAGNOSIS — D62 Acute posthemorrhagic anemia: Secondary | ICD-10-CM | POA: Diagnosis not present

## 2023-10-29 DIAGNOSIS — Z7982 Long term (current) use of aspirin: Secondary | ICD-10-CM

## 2023-10-29 DIAGNOSIS — M726 Necrotizing fasciitis: Secondary | ICD-10-CM | POA: Diagnosis not present

## 2023-10-29 DIAGNOSIS — N179 Acute kidney failure, unspecified: Secondary | ICD-10-CM | POA: Diagnosis present

## 2023-10-29 DIAGNOSIS — F411 Generalized anxiety disorder: Secondary | ICD-10-CM

## 2023-10-29 DIAGNOSIS — B9562 Methicillin resistant Staphylococcus aureus infection as the cause of diseases classified elsewhere: Secondary | ICD-10-CM | POA: Diagnosis present

## 2023-10-29 DIAGNOSIS — Z880 Allergy status to penicillin: Secondary | ICD-10-CM

## 2023-10-29 DIAGNOSIS — E876 Hypokalemia: Secondary | ICD-10-CM | POA: Diagnosis not present

## 2023-10-29 DIAGNOSIS — E1159 Type 2 diabetes mellitus with other circulatory complications: Secondary | ICD-10-CM | POA: Diagnosis not present

## 2023-10-29 DIAGNOSIS — L89511 Pressure ulcer of right ankle, stage 1: Secondary | ICD-10-CM | POA: Diagnosis present

## 2023-10-29 DIAGNOSIS — E1140 Type 2 diabetes mellitus with diabetic neuropathy, unspecified: Secondary | ICD-10-CM | POA: Diagnosis present

## 2023-10-29 DIAGNOSIS — I5033 Acute on chronic diastolic (congestive) heart failure: Secondary | ICD-10-CM | POA: Diagnosis present

## 2023-10-29 DIAGNOSIS — R609 Edema, unspecified: Secondary | ICD-10-CM | POA: Diagnosis not present

## 2023-10-29 DIAGNOSIS — E119 Type 2 diabetes mellitus without complications: Secondary | ICD-10-CM | POA: Diagnosis not present

## 2023-10-29 DIAGNOSIS — I061 Rheumatic aortic insufficiency: Secondary | ICD-10-CM | POA: Diagnosis not present

## 2023-10-29 DIAGNOSIS — R570 Cardiogenic shock: Secondary | ICD-10-CM | POA: Diagnosis not present

## 2023-10-29 DIAGNOSIS — E1165 Type 2 diabetes mellitus with hyperglycemia: Secondary | ICD-10-CM | POA: Diagnosis not present

## 2023-10-29 DIAGNOSIS — G2581 Restless legs syndrome: Secondary | ICD-10-CM | POA: Diagnosis present

## 2023-10-29 DIAGNOSIS — Z79890 Hormone replacement therapy (postmenopausal): Secondary | ICD-10-CM

## 2023-10-29 DIAGNOSIS — Z881 Allergy status to other antibiotic agents status: Secondary | ICD-10-CM

## 2023-10-29 DIAGNOSIS — E8809 Other disorders of plasma-protein metabolism, not elsewhere classified: Secondary | ICD-10-CM | POA: Diagnosis present

## 2023-10-29 DIAGNOSIS — I739 Peripheral vascular disease, unspecified: Secondary | ICD-10-CM | POA: Diagnosis not present

## 2023-10-29 DIAGNOSIS — Z79899 Other long term (current) drug therapy: Secondary | ICD-10-CM

## 2023-10-29 DIAGNOSIS — I509 Heart failure, unspecified: Secondary | ICD-10-CM | POA: Diagnosis not present

## 2023-10-29 DIAGNOSIS — Z833 Family history of diabetes mellitus: Secondary | ICD-10-CM

## 2023-10-29 DIAGNOSIS — I2510 Atherosclerotic heart disease of native coronary artery without angina pectoris: Secondary | ICD-10-CM | POA: Diagnosis not present

## 2023-10-29 DIAGNOSIS — L89616 Pressure-induced deep tissue damage of right heel: Secondary | ICD-10-CM | POA: Diagnosis present

## 2023-10-29 DIAGNOSIS — J9602 Acute respiratory failure with hypercapnia: Secondary | ICD-10-CM | POA: Diagnosis not present

## 2023-10-29 DIAGNOSIS — I25118 Atherosclerotic heart disease of native coronary artery with other forms of angina pectoris: Secondary | ICD-10-CM | POA: Diagnosis not present

## 2023-10-29 DIAGNOSIS — I5021 Acute systolic (congestive) heart failure: Secondary | ICD-10-CM | POA: Diagnosis not present

## 2023-10-29 DIAGNOSIS — E785 Hyperlipidemia, unspecified: Secondary | ICD-10-CM | POA: Diagnosis not present

## 2023-10-29 DIAGNOSIS — K5901 Slow transit constipation: Secondary | ICD-10-CM | POA: Diagnosis not present

## 2023-10-29 DIAGNOSIS — J810 Acute pulmonary edema: Secondary | ICD-10-CM | POA: Diagnosis not present

## 2023-10-29 LAB — GLUCOSE, CAPILLARY
Glucose-Capillary: 89 mg/dL (ref 70–99)
Glucose-Capillary: 154 mg/dL — ABNORMAL HIGH (ref 70–99)
Glucose-Capillary: 123 mg/dL — ABNORMAL HIGH (ref 70–99)
Glucose-Capillary: 131 mg/dL — ABNORMAL HIGH (ref 70–99)

## 2023-10-29 MED ORDER — ENOXAPARIN SODIUM 40 MG/0.4ML IJ SOSY
40.0000 mg | PREFILLED_SYRINGE | INTRAMUSCULAR | Status: DC
  Administered 2023-10-30 – 2023-11-12 (×14): 40 mg via SUBCUTANEOUS
  Filled 2023-10-29 (×14): qty 0.4

## 2023-10-29 MED ORDER — TRIMETHOBENZAMIDE HCL 100 MG/ML IM SOLN: 200.0000 mg | Freq: Three times a day (TID) | INTRAMUSCULAR | Status: DC | PRN

## 2023-10-29 MED ORDER — CHLORHEXIDINE GLUCONATE CLOTH 2 % EX PADS: 6.0000 | MEDICATED_PAD | Freq: Every day | CUTANEOUS | Status: DC

## 2023-10-29 MED ORDER — MELATONIN 5 MG PO TABS
5.0000 mg | ORAL_TABLET | Freq: Every evening | ORAL | Status: DC | PRN
Start: 2023-10-29 — End: 2023-11-12

## 2023-10-29 MED ORDER — INSULIN GLARGINE-YFGN 100 UNIT/ML ~~LOC~~ SOLN
10.0000 [IU] | Freq: Every day | SUBCUTANEOUS | Status: DC
  Administered 2023-10-29 – 2023-11-11 (×14): 10 [IU] via SUBCUTANEOUS
  Filled 2023-10-29 (×15): qty 0.1

## 2023-10-29 MED ORDER — SENNOSIDES-DOCUSATE SODIUM 8.6-50 MG PO TABS
2.0000 | ORAL_TABLET | Freq: Every day | ORAL | Status: DC
  Administered 2023-10-30 – 2023-11-12 (×14): 2 via ORAL
  Filled 2023-10-29 (×14): qty 2

## 2023-10-29 MED ORDER — ADULT MULTIVITAMIN W/MINERALS CH
1.0000 | ORAL_TABLET | Freq: Every day | ORAL | Status: DC
  Administered 2023-10-29 – 2023-10-30 (×2): 1 via ORAL
  Filled 2023-10-29 (×2): qty 1

## 2023-10-29 MED ORDER — INSULIN ASPART 100 UNIT/ML IJ SOLN: 0.0000 [IU] | Freq: Every day | INTRAMUSCULAR | Status: DC

## 2023-10-29 MED ORDER — CLOPIDOGREL BISULFATE 75 MG PO TABS
75.0000 mg | ORAL_TABLET | Freq: Every day | ORAL | Status: DC
  Administered 2023-10-30 – 2023-11-11 (×13): 75 mg via ORAL
  Filled 2023-10-29 (×13): qty 1

## 2023-10-29 MED ORDER — PANTOPRAZOLE SODIUM 40 MG PO TBEC
40.0000 mg | DELAYED_RELEASE_TABLET | Freq: Two times a day (BID) | ORAL | Status: DC
  Administered 2023-10-29 – 2023-11-12 (×28): 40 mg via ORAL
  Filled 2023-10-29 (×28): qty 1

## 2023-10-29 MED ORDER — SODIUM CHLORIDE 0.9% FLUSH
10.0000 mL | Freq: Two times a day (BID) | INTRAVENOUS | Status: DC
  Administered 2023-10-29 – 2023-11-03 (×8): 10 mL

## 2023-10-29 MED ORDER — JUVEN PO PACK
1.0000 | PACK | Freq: Two times a day (BID) | ORAL | Status: DC
  Administered 2023-10-29 – 2023-11-12 (×27): 1 via ORAL
  Filled 2023-10-29 (×27): qty 1

## 2023-10-29 MED ORDER — SODIUM CHLORIDE 0.9% FLUSH: 10.0000 mL | INTRAVENOUS | Status: DC | PRN

## 2023-10-29 MED ORDER — BISACODYL 10 MG RE SUPP: 10.0000 mg | Freq: Every day | RECTAL | Status: DC | PRN

## 2023-10-29 MED ORDER — FLEET ENEMA RE ENEM
1.0000 | ENEMA | Freq: Once | RECTAL | Status: DC | PRN
Start: 2023-10-29 — End: 2023-11-12

## 2023-10-29 MED ORDER — FERROUS SULFATE 325 (65 FE) MG PO TABS
325.0000 mg | ORAL_TABLET | Freq: Every day | ORAL | Status: DC
  Administered 2023-10-30 – 2023-11-12 (×14): 325 mg via ORAL
  Filled 2023-10-29 (×14): qty 1

## 2023-10-29 MED ORDER — POLYETHYLENE GLYCOL 3350 17 G PO PACK: 17.0000 g | PACK | Freq: Two times a day (BID) | ORAL | Status: DC

## 2023-10-29 MED ORDER — SENNOSIDES-DOCUSATE SODIUM 8.6-50 MG PO TABS: 1.0000 | ORAL_TABLET | Freq: Two times a day (BID) | ORAL | Status: DC

## 2023-10-29 MED ORDER — CHLORHEXIDINE GLUCONATE CLOTH 2 % EX PADS
6.0000 | MEDICATED_PAD | Freq: Two times a day (BID) | CUTANEOUS | Status: DC
  Administered 2023-10-30 – 2023-11-12 (×28): 6 via TOPICAL

## 2023-10-29 MED ORDER — GABAPENTIN 100 MG PO CAPS
100.0000 mg | ORAL_CAPSULE | Freq: Three times a day (TID) | ORAL | Status: DC
  Administered 2023-10-29: 100 mg via ORAL
  Filled 2023-10-29: qty 1

## 2023-10-29 MED ORDER — DAPTOMYCIN IV (FOR PTA / DISCHARGE USE ONLY): 700.0000 mg | INTRAVENOUS | 0 refills | Status: DC

## 2023-10-29 MED ORDER — OXYCODONE-ACETAMINOPHEN 5-325 MG PO TABS
1.0000 | ORAL_TABLET | ORAL | Status: DC | PRN
  Administered 2023-10-29 – 2023-11-03 (×14): 2 via ORAL
  Administered 2023-11-03: 1 via ORAL
  Administered 2023-11-04 (×2): 2 via ORAL
  Administered 2023-11-05 – 2023-11-08 (×6): 1 via ORAL
  Filled 2023-10-29: qty 2
  Filled 2023-10-29: qty 1
  Filled 2023-10-29 (×5): qty 2
  Filled 2023-10-29: qty 1
  Filled 2023-10-29 (×2): qty 2
  Filled 2023-10-29: qty 1
  Filled 2023-10-29 (×3): qty 2
  Filled 2023-10-29 (×2): qty 1
  Filled 2023-10-29 (×2): qty 2
  Filled 2023-10-29: qty 1
  Filled 2023-10-29 (×4): qty 2
  Filled 2023-10-29: qty 1
  Filled 2023-10-29: qty 2
  Filled 2023-10-29 (×2): qty 1
  Filled 2023-10-29: qty 2

## 2023-10-29 MED ORDER — PROCHLORPERAZINE 25 MG RE SUPP: 12.5000 mg | Freq: Four times a day (QID) | RECTAL | Status: DC | PRN

## 2023-10-29 MED ORDER — DAPTOMYCIN-SODIUM CHLORIDE 700-0.9 MG/100ML-% IV SOLN
700.0000 mg | Freq: Every day | INTRAVENOUS | Status: DC
  Administered 2023-10-29 – 2023-11-12 (×15): 700 mg via INTRAVENOUS
  Filled 2023-10-29 (×15): qty 100

## 2023-10-29 MED ORDER — DIPHENHYDRAMINE HCL 25 MG PO CAPS
25.0000 mg | ORAL_CAPSULE | Freq: Four times a day (QID) | ORAL | Status: DC | PRN
  Administered 2023-11-08: 25 mg via ORAL
  Filled 2023-10-29: qty 1

## 2023-10-29 MED ORDER — ATORVASTATIN CALCIUM 40 MG PO TABS
40.0000 mg | ORAL_TABLET | Freq: Every day | ORAL | Status: DC
  Administered 2023-10-30 – 2023-11-12 (×14): 40 mg via ORAL
  Filled 2023-10-29 (×14): qty 1

## 2023-10-29 MED ORDER — NYSTATIN 100000 UNIT/GM EX POWD: Freq: Two times a day (BID) | CUTANEOUS | Status: AC

## 2023-10-29 MED ORDER — PROCHLORPERAZINE EDISYLATE 10 MG/2ML IJ SOLN
5.0000 mg | Freq: Four times a day (QID) | INTRAMUSCULAR | Status: DC | PRN
  Administered 2023-11-09: 10 mg via INTRAVENOUS
  Filled 2023-10-29: qty 2

## 2023-10-29 MED ORDER — PROSOURCE PLUS PO LIQD
30.0000 mL | Freq: Two times a day (BID) | ORAL | Status: DC
  Administered 2023-10-29 – 2023-10-30 (×2): 30 mL via ORAL
  Filled 2023-10-29 (×2): qty 30

## 2023-10-29 MED ORDER — GUAIFENESIN-DM 100-10 MG/5ML PO SYRP
5.0000 mL | ORAL_SOLUTION | Freq: Four times a day (QID) | ORAL | Status: DC | PRN
  Administered 2023-11-09 – 2023-11-10 (×2): 10 mL via ORAL
  Filled 2023-10-29 (×2): qty 10

## 2023-10-29 MED ORDER — INSULIN ASPART 100 UNIT/ML IJ SOLN
0.0000 [IU] | Freq: Three times a day (TID) | INTRAMUSCULAR | Status: DC
  Administered 2023-10-29 – 2023-11-05 (×6): 1 [IU] via SUBCUTANEOUS
  Administered 2023-11-06: 2 [IU] via SUBCUTANEOUS
  Administered 2023-11-07 (×2): 1 [IU] via SUBCUTANEOUS
  Administered 2023-11-08: 2 [IU] via SUBCUTANEOUS
  Administered 2023-11-09: 1 [IU] via SUBCUTANEOUS
  Administered 2023-11-09: 2 [IU] via SUBCUTANEOUS
  Administered 2023-11-10: 1 [IU] via SUBCUTANEOUS
  Administered 2023-11-10: 2 [IU] via SUBCUTANEOUS
  Administered 2023-11-10: 1 [IU] via SUBCUTANEOUS
  Administered 2023-11-11 – 2023-11-12 (×5): 2 [IU] via SUBCUTANEOUS

## 2023-10-29 MED ORDER — PROCHLORPERAZINE MALEATE 5 MG PO TABS
5.0000 mg | ORAL_TABLET | Freq: Four times a day (QID) | ORAL | Status: DC | PRN
  Administered 2023-11-11: 10 mg via ORAL
  Filled 2023-10-29: qty 2

## 2023-10-29 MED ORDER — GABAPENTIN 300 MG PO CAPS
300.0000 mg | ORAL_CAPSULE | Freq: Three times a day (TID) | ORAL | Status: DC
  Administered 2023-10-29 – 2023-11-02 (×12): 300 mg via ORAL
  Filled 2023-10-29 (×12): qty 1

## 2023-10-29 MED ORDER — ALUM & MAG HYDROXIDE-SIMETH 200-200-20 MG/5ML PO SUSP: 30.0000 mL | ORAL | Status: DC | PRN

## 2023-10-29 MED ORDER — LEVOTHYROXINE SODIUM 75 MCG PO TABS
150.0000 ug | ORAL_TABLET | Freq: Every day | ORAL | Status: DC
  Administered 2023-10-30 – 2023-11-12 (×14): 150 ug via ORAL
  Filled 2023-10-29 (×14): qty 2

## 2023-10-29 MED ORDER — PHENOL 1.4 % MT LIQD: 1.0000 | OROMUCOSAL | Status: DC | PRN

## 2023-10-29 MED ORDER — METHOCARBAMOL 500 MG PO TABS
500.0000 mg | ORAL_TABLET | Freq: Four times a day (QID) | ORAL | Status: DC | PRN
  Administered 2023-11-01 – 2023-11-11 (×11): 500 mg via ORAL
  Filled 2023-10-29 (×11): qty 1

## 2023-10-29 MED ORDER — POLYETHYLENE GLYCOL 3350 17 G PO PACK
17.0000 g | PACK | Freq: Two times a day (BID) | ORAL | Status: DC
  Administered 2023-10-30 – 2023-11-08 (×7): 17 g via ORAL
  Filled 2023-10-29 (×18): qty 1

## 2023-10-29 MED ORDER — BENEPROTEIN PO POWD
1.0000 | Freq: Three times a day (TID) | ORAL | Status: DC
  Filled 2023-10-29: qty 227

## 2023-10-29 MED ORDER — NYSTATIN 100000 UNIT/GM EX POWD
Freq: Two times a day (BID) | CUTANEOUS | Status: DC
  Administered 2023-11-05: 1 via TOPICAL
  Filled 2023-10-29: qty 15

## 2023-10-29 MED ORDER — ACETAMINOPHEN 325 MG PO TABS
325.0000 mg | ORAL_TABLET | ORAL | Status: DC | PRN
  Administered 2023-11-02 – 2023-11-03 (×2): 650 mg via ORAL
  Filled 2023-10-29 (×2): qty 2

## 2023-10-29 NOTE — Progress Notes (Signed)
 Initial Nutrition Assessment  DOCUMENTATION CODES:   Not applicable  INTERVENTION:   Juven BID, each packet provides 80 calories, 8 grams of carbohydrate, 2.5  grams of protein (collagen), 7 grams of L-arginine and 7 grams of L-glutamine; supplement contains CaHMB, Vitamins C, E, B12 and Zinc  to promote wound healing  Prosource Plus 30 ml PO BID, each packet provides 100 kcal and 15 gm protein  MVI with minerals daily  D/C Beneprotein, not on hospital formulary  Diabetes education handouts attached to discharge instructions.   NUTRITION DIAGNOSIS:   Increased nutrient needs related to post-op healing, wound healing as evidenced by estimated needs.  GOAL:   Patient will meet greater than or equal to 90% of their needs  MONITOR:   PO intake, Supplement acceptance, Skin  REASON FOR ASSESSMENT:   Consult Diet education, Wound healing  ASSESSMENT:   58 yo female admitted with functional deficits d/t L BKA. PMH includes R TMA, HTN, HLD, DM, hypothyroidism, GERD, CAD, PVD, mitral regurgitation, aortic stenosis, MI, CHF, anemia.  Patient just transferred to CIR today.  S/P L BKA 6/24. S/P L BKA revision 6/25. VAC in place. Patient has been on a heart healthy carb modified diet during acute hospitalization. She was eating very well, consuming 100% of meals.   Labs reviewed.  CBG: 89-154 Corrected calcium  9.8 WNL Albumin  < 1.5 (L) Albumin  is not an indicator of nutritional status, but rather an indicator of morbidity and mortality, and recovery from acute and chronic illness. Albumin  is reflective of inflammatory process, acute stress response, and levels are highly affected (decreased) by volume overload.  Medications reviewed and include ferrous sulfate , novolog , semglee , protonix , miralax , senokot-s. Supplements: Juven BID; Prosource Plus BID; Beneprotein powder TID. Beneprotein is no longer on hospital formulary, will discontinue.   Weight history reviewed. No  significant weight changes noted. 6/17: 94.1 kg 6/30: 104.8 kg (up d/t swelling s/p amputation) 7/01: 95.3 kg Expect weight to continue to decline since she just had an amputation.  NUTRITION - FOCUSED PHYSICAL EXAM:  Unable to complete  Diet Order:   Diet Order             Diet heart healthy/carb modified Room service appropriate? Yes; Fluid consistency: Thin  Diet effective now                   EDUCATION NEEDS:   Education needs have been addressed  Skin:  Skin Assessment: Skin Integrity Issues: Skin Integrity Issues:: Stage I, Wound VAC Stage I: R ankle Wound Vac: L BKA site  Last BM:  6/30  Height:   Ht Readings from Last 1 Encounters:  10/29/23 5' 4 (1.626 m)    Weight:   Wt Readings from Last 1 Encounters:  10/29/23 95.3 kg    Estimated Nutritional Needs:   Kcal:  1700-1900  Protein:  100-120 gm  Fluid:  >/= 1.8 L   Suzen HUNT RD, LDN, CNSC Contact via secure chat. If unavailable, use group chat RD Inpatient.

## 2023-10-29 NOTE — Discharge Summary (Signed)
 Physician Discharge Summary  Kayla Schmitt FMW:[id number] DOB: 02/16/1966  PCP: [name], NP  Admitted from: Home Discharged to: [facility]  Admit date: 10/20/2023 Discharge date: 10/29/2023  Recommendations for Outpatient Follow-up:    Follow-up Information     Dea Shiner, MD Follow up.   Specialty: Infectious Diseases Why: 11/14/2023 at 9:30 AM.  Please call reschedule if you are not able to make this appointment.  May be virtual as well Contact information: [address] [place] [address] [phone]         [name], NP. Schedule an appointment as soon as possible for a visit.   Specialty: Nurse Practitioner Why: To be seen upon discharge from SNF. Contact information: [address] [place] [address] [phone]         Harden Jerona GAILS, MD. Schedule an appointment as soon as possible for a visit.   Specialty: Orthopedic Surgery Contact information: [address] [place]  [place] [address] [phone]         MD at [facility] Follow up.   Why: Kindly follow labs periodically including CK levels with ongoing daptomycin , biweekly CBC and CMP.                 Home Health: None    Equipment/Devices: TBD at [facility]    Discharge Condition: Improved and stable.   Code Status: Full Code Diet recommendation:  Discharge Diet Orders (From admission, onward)     Start     Ordered   10/29/23 0000  Diet - low sodium heart healthy        10/29/23 1122   10/29/23 0000  Diet Carb Modified        10/29/23 1122             Discharge Diagnoses:  Principal Problem:   Necrotizing fasciitis (HCC) Active Problems:   PVD (peripheral vascular disease) (HCC)   Sepsis (HCC)   AKI (acute kidney injury) (HCC)   Diffuse pain in left lower extremity   Elevated troponin   Hypokalemia   Insulin  dependent type 2 diabetes mellitus (HCC)   Hypothyroidism   Acute on chronic anemia   History of GI bleed   History of  coronary artery disease   Prolonged QT interval   Gangrene of left foot (HCC)   MRSA bacteremia   Brief Hospital Course:  58 y.o. female with medical history significant of multivessel CAD, aortic stenosis,HTN/HLD, HFrEF > 40 to 45%, history of PAD s/p bilateral stent placement and s/p bilateral metatarsal amputation, chronic anemia, hypothyroidism, history of GI bleed secondary to AVM who has been initially brought to [facility] for lethargy, fever and hypotension.  Sepsis protocol was initiated and she was started on vancomycin  and cefepime .  Blood cultures grew gram-positive cocci but no clear source of infection.  CT of chest abdomen and pelvis without clear abnormality.  Venous Doppler negative for DVT.  She was noted to have purple spotty area of discoloration of left leg.  She was then transferred to [facility] for vascular surgery consult. At [facility] patient has been treated for sepsis with endorgan dysfunction including elevated lactic acid and AKI, peripheral vascular disease with increased pain of the left lower extremity, AKI, altered mental status, DM type II and elevated troponin secondary to demand ischemia. Patient also received 1 unit of blood transfusion as hemoglobin dropped 8.2-7. No GI bleed at outside hospital.      Assessment & Plan:  Necrotizing fasciitis of left lower extremity/PAD  Vascular surgery and orthopedics were consulted She has history of bilateral transmetatarsal amputations and previous bilateral lower extremity endovascular revascularization. S/p left BKA with wound VAC placement on 6/24 by Dr. Fonda Rim S/p left revision BKA, on 6/25 by Dr. Harden.  Orthopedic follow-up appreciated.  Dr. Harden to advise duration of wound VAC. Outpatient follow-up with Dr. Harden.   MRSA bacteremia Surgical intervention on left lower extremity as noted above. ID managing antimicrobial treatment Vancomycin  switched to daptomycin .  CK being  followed while on daptomycin , 193/normal on 6/30. TEE 6/26: LVEF 60 to 65%. No evidence of vegetation/infective endocarditis on this transesophageael echocardiogram.  No evidence of intra-atrial shunt. Surveillance blood cultures x 2 from 6/22, negative, final. Surgical tissue 6/25: No growth. As per ID sign off on 6/27: Continue current dose of daptomycin  through 7/20, therapeutic drug monitoring of CK while on daptomycin .They will arrange outpatient follow-up in ID clinic. PICC line placed 6/28.   Acute kidney injury Suspecting prerenal in the setting of sepsis and hypotension Resolved.  Avoid nephrotoxics and hypotension.   Elevated troponin secondary to demand ischemia/history of CAD Outside facility troponin, 696 > 576 > 532.  No anginal symptoms or acute EKG changes. TTE: LVEF 50-55%, grade 2 diastolic dysfunction. TEE results as above. Was on IV heparin  drip for PAD indication which was discontinued on 6/24. Continue atorvastatin  and Plavix .   Hypokalemia and hypomagnesemia Replaced   Acute on chronic anemia/history of GI bleed secondary to AVM Baseline hemoglobin around 10. Patient received a unit of PRBC at outside hospital. Follow CBCs daily and transfuse for hemoglobin 7 g or less Hemoglobin 7.6 >7.6 >7.9, stable. Periodically monitor CBCs.   Insulin -dependent type 2 DM Good inpatient control on current dose of Semglee  10 units at bedtime and NovoLog  SSI Has not used much of SSI in the hospital, discontinued at discharge. A1c 5.5 on 6/30.   Chronic combined systolic and diastolic CHF EF appears to have improved compared to prior.  Clinically euvolemic. Resumed prior home dose of Lasix  at discharge   Hypertension Controlled. Resumed home dose of low-dose Toprol  XL at discharge.   Prolonged QT Minimize QT prolonging drugs Last EKG on 6/22 showed QTc of 486 ms.   Hypothyroidism Continue with levothyroxine .   Constipation Continue aggressive bowel regimen. Had  a large BM on 6/30.   Moderate to severe aortic stenosis Noted on TEE 6/26 Outpatient follow-up with cardiology.   Body mass index is 39.66 kg/m./Obesity Complicates care.  Outpatient follow-up.    Consultants:   Vascular surgery Orthopedics Infectious disease Cardiology   Procedures:   As above   Discharge Instructions  Discharge Instructions     (HEART FAILURE PATIENTS) Call MD:  Anytime you have any of the following symptoms: 1) 3 pound weight gain in 24 hours or 5 pounds in 1 week 2) shortness of breath, with or without a dry hacking cough 3) swelling in the hands, feet or stomach 4) if you have to sleep on extra pillows at night in order to breathe.   Complete by: As directed    [facility] pharmacist to adjust dose for Vancomycin , Aminoglycosides and other anti-infective therapies as requested by physician.   Complete by: As directed    [facility] to provide Cath Flo 2mg    Complete by: As directed    Administer for PICC line occlusion and as ordered by physician for other access device issues.   Anaphylaxis Kit: Provided to treat  any anaphylactic reaction to the medication being provided to the patient if First Dose or when requested by physician   Complete by: As directed    Epinephrine  1mg /ml vial / amp: Administer 0.3mg  (0.67ml) subcutaneously once for moderate to severe anaphylaxis, nurse to call physician and pharmacy when reaction occurs and call 911 if needed for immediate care   Diphenhydramine  50mg /ml IV vial: Administer 25-50mg  IV/IM PRN for first dose reaction, rash, itching, mild reaction, nurse to call physician and pharmacy when reaction occurs   Sodium Chloride  0.9% NS 500ml IV: Administer if needed for hypovolemic blood pressure drop or as ordered by physician after call to physician with anaphylactic reaction   Call MD for:  difficulty breathing, headache or visual disturbances   Complete by: As directed    Call MD for:  extreme  fatigue   Complete by: As directed    Call MD for:  persistant dizziness or light-headedness   Complete by: As directed    Call MD for:  persistant nausea and vomiting   Complete by: As directed    Call MD for:  redness, tenderness, or signs of infection (pain, swelling, redness, odor or green/yellow discharge around incision site)   Complete by: As directed    Call MD for:  severe uncontrolled pain   Complete by: As directed    Call MD for:  temperature >100.4   Complete by: As directed    Change dressing on IV access line weekly and PRN   Complete by: As directed    Diet - low sodium heart healthy   Complete by: As directed    Diet Carb Modified   Complete by: As directed    Discharge wound care:   Complete by: As directed    Negative Pressure Wound Therapy - Incisional      Comments: At time of discharge attach the wound VAC dressing to the portable Prevena plus pump.  Show patient how to plug this and to keep it charged.   Flush IV access with Sodium Chloride  0.9% and Heparin  10 units/ml or 100 units/ml   Complete by: As directed    Home infusion instructions - [facility]   Complete by: As directed    Instructions: Flush IV access with Sodium Chloride  0.9% and Heparin  10units/ml or 100units/ml   Change dressing on IV access line: Weekly and PRN   Instructions Cath Flo 2mg : Administer for PICC Line occlusion and as ordered by physician for other access device   [facility] pharmacist to adjust dose for: Vancomycin , Aminoglycosides and other anti-infective therapies as requested by physician   Increase activity slowly   Complete by: As directed    Method of administration may be changed at the discretion of home infusion pharmacist based upon assessment of the patient and/or caregiver's ability to self-administer the medication ordered   Complete by: As directed    Negative Pressure Wound Therapy - Incisional   Complete by: As directed    At time of  discharge attach the wound VAC dressing to the portable Prevena plus pump.  Show patient how to plug this and to keep it charged.        Medication List     TAKE these medications    acetaminophen  500 MG tablet Commonly known as: TYLENOL  Take 1,000 mg by mouth 2 (two) times daily as needed for moderate pain (pain score 4-6), headache or fever.   aspirin  EC 81 MG tablet Take 81 mg by mouth daily.  atorvastatin  40 MG tablet Commonly known as: LIPITOR Take 40 mg by mouth daily.   bisacodyl  10 MG suppository Commonly known as: DULCOLAX Place 1 suppository (10 mg total) rectally daily as needed for moderate constipation or mild constipation.   clopidogrel  75 MG tablet Commonly known as: PLAVIX  TAKE 1 TABLET BY MOUTH EVERY DAY WITH BREAKFAST   daptomycin  IVPB Commonly known as: CUBICIN  Inject 700 mg into the vein daily for 23 days. Indication:  MRSA bacteremia First Dose: Yes Last Day of Therapy:  11/17/23 Labs - Once weekly:  CBC/D, BMP, and CPK Labs - Once weekly: ESR and CRP Method of administration: IV Push Method of administration may be changed at the discretion of home infusion pharmacist based upon assessment of the patient and/or caregiver's ability to self-administer the medication ordered.   ferrous sulfate  325 (65 FE) MG EC tablet Take 1 tablet (325 mg total) by mouth daily with breakfast.   furosemide  40 MG tablet Commonly known as: LASIX  Take 1 tablet (40 mg total) by mouth 2 (two) times daily.   gabapentin  300 MG capsule Commonly known as: NEURONTIN  Take 300 mg by mouth at bedtime.   levothyroxine  150 MCG tablet Commonly known as: SYNTHROID  Take 150 mcg by mouth daily before breakfast.   metoprolol  succinate 25 MG 24 hr tablet Commonly known as: TOPROL -XL Take 0.5 tablets (12.5 mg total) by mouth daily.   nitroGLYCERIN  0.4 MG SL tablet Commonly known as: Nitrostat  Place 1 tablet (0.4 mg total) under the tongue every 5 (five) minutes x 3 doses as  needed for chest pain (if no relief after 3rd dose proceed to ED or call 911). 11/06/2022-New   nystatin powder Commonly known as: MYCOSTATIN/NYSTOP Apply topically 2 (two) times daily for 7 days. Apply to abdominal fold - MASD   oxyCODONE  5 MG immediate release tablet Commonly known as: Roxicodone  Take 1 tablet (5 mg total) by mouth every 6 (six) hours as needed for severe pain (pain score 7-10).   pantoprazole  40 MG tablet Commonly known as: PROTONIX  Take 40 mg by mouth daily.   Pharmacist Choice Alcohol Pads Apply topically.   polyethylene glycol 17 g packet Commonly known as: MIRALAX  / GLYCOLAX  Take 17 g by mouth 2 (two) times daily.   promethazine  25 MG tablet Commonly known as: PHENERGAN  Take 1 tablet (25 mg total) by mouth every 6 (six) hours as needed for nausea or vomiting.   senna-docusate 8.6-50 MG tablet Commonly known as: Senokot-S Take 1 tablet by mouth 2 (two) times daily.   Tresiba  FlexTouch 100 UNIT/ML FlexTouch Pen Generic drug: insulin  degludec Inject 10 Units into the skin at bedtime. What changed: how much to take   ZzzQuil 25 MG Caps Generic drug: diphenhydrAMINE  HCl (Sleep) Take 25 mg by mouth at bedtime as needed (Sleep).       Allergies  Allergen Reactions   Trental  [Pentoxifylline ] Nausea And Vomiting   Vibramycin [Doxycycline] Nausea And Vomiting   Penicillins Rash    Tolerated cephalosporins 07/2022       Procedures/Studies: US  EKG SITE RITE Result Date: 10/25/2023 If Site Rite image not attached, placement could not be confirmed due to current cardiac rhythm.  ECHO TEE Result Date: 10/24/2023    TRANSESOPHOGEAL ECHO REPORT   Patient Name:   Kayla Schmitt [facility] Date of Exam: 10/24/2023 Medical Rec #:  [id number]              Height:       64.0 in Accession #:  [id number]             Weight:       221.8 lb Date of Birth:  08-07-1965               BSA:          2.044 m Patient Age:    57 years               BP:           156/65 mmHg  Patient Gender: F                      HR:           84 bpm. Exam Location:  Inpatient Procedure: Transesophageal Echo, Cardiac Doppler, Color Doppler and Saline            Contrast Bubble Study (Both Spectral and Color Flow Doppler were            utilized during procedure). Indications:     Endocarditis  History:         Patient has prior history of Echocardiogram examinations, most                  recent 10/21/2023. CAD; Risk Factors:Hypertension and                  Dyslipidemia.  Sonographer:     Philomena Daring Referring Phys:  [id number] TAYLOR A PARCELLS Diagnosing Phys: Madonna Large PROCEDURE: After discussion of the risks and benefits of a TEE, an informed consent was obtained from the patient. The transesophogeal probe was passed without difficulty through the esophogus of the patient. Sedation performed by different physician. The patient was monitored while under deep sedation. Anesthestetic sedation was provided intravenously by Anesthesiology: 482mg  of Propofol , 60mg  of Lidocaine . The patient developed no complications during the procedure.  IMPRESSIONS  1. Left ventricular ejection fraction, by estimation, is 60 to 65%. The left ventricle has normal function. The left ventricle has no regional wall motion abnormalities.  2. Right ventricular systolic function is normal. The right ventricular size is normal.  3. No left atrial/left atrial appendage thrombus was detected. The LAA emptying velocity was 66 cm/s.  4. The mitral valve is degenerative. Mild to moderate mitral valve regurgitation. No evidence of mitral stenosis.  5. Native valve, calcified, reduced leaflet excursion, mild to moderate aortic regurgitation, moderate to severe aortic stenosis (peak velocity 3.4 m/s, mean gradient 27 mmHg, aortic valve area per VTI 1.07 cm, dimensional index 0.26).  6. There is mild (Grade II) layered plaque involving the descending aorta.  7. Agitated saline contrast bubble study was negative, with no evidence of any  interatrial shunt. Conclusion(s)/Recommendation(s): No evidence of vegetation/infective endocarditis on this transesophageael echocardiogram. FINDINGS  Left Ventricle: Left ventricular ejection fraction, by estimation, is 60 to 65%. The left ventricle has normal function. The left ventricle has no regional wall motion abnormalities. The left ventricular internal cavity size was normal in size. Right Ventricle: The right ventricular size is normal. Right vetricular wall thickness was not well visualized. Right ventricular systolic function is normal. Left Atrium: Left atrial size was normal in size. No left atrial/left atrial appendage thrombus was detected. The LAA emptying velocity was 66 cm/s. Right Atrium: Right atrial size was normal in size. Prominent Eustachian valve. Pericardium: There is no evidence of pericardial effusion. Mitral Valve: The mitral valve is degenerative in appearance. There is mild thickening of the mitral valve leaflet(s).  Normal mobility of the mitral valve leaflets. Mild mitral annular calcification. Mild to moderate mitral valve regurgitation. No evidence of mitral valve stenosis. There is no evidence of mitral valve vegetation. Tricuspid Valve: The tricuspid valve is grossly normal. Tricuspid valve regurgitation is trivial. No evidence of tricuspid stenosis. There is no evidence of tricuspid valve vegetation. Aortic Valve: Native valve, calcified, reduced leaflet excursion, mild to moderate aortic regurgitation, moderate to severe aortic stenosis (peak velocity 3.4 m/s, mean gradient 27 mmHg, aortic valve area per VTI 1.07 cm, dimensional index 0.26). Aortic  valve mean gradient measures 27.2 mmHg. Aortic valve peak gradient measures 46.3 mmHg. Aortic valve area, by VTI measures 1.07 cm. There is no evidence of aortic valve vegetation. Pulmonic Valve: The pulmonic valve was grossly normal. Pulmonic valve regurgitation is trivial. No evidence of pulmonic stenosis. There is no evidence  of pulmonic valve vegetation. Aorta: The aortic root and ascending aorta are structurally normal, with no evidence of dilitation. There is mild (Grade II) layered plaque involving the descending aorta. Venous: The left lower pulmonary vein, left upper pulmonary vein, right upper pulmonary vein and right lower pulmonary vein are normal. IAS/Shunts: The interatrial septum appears to be lipomatous. The atrial septum is grossly normal. Agitated saline contrast was given intravenously to evaluate for intracardiac shunting. Agitated saline contrast bubble study was negative, with no evidence  of any interatrial shunt.  LEFT VENTRICLE PLAX 2D LVOT diam:     2.30 cm LV SV:         74 LV SV Index:   36 LVOT Area:     4.15 cm  AORTIC VALVE                     PULMONIC VALVE AV Area (Vmax):    1.10 cm      RVOT Peak grad: 4 mmHg AV Area (Vmean):   1.16 cm AV Area (VTI):     1.07 cm AV Vmax:           340.25 cm/s AV Vmean:          243.250 cm/s AV VTI:            0.689 m AV Peak Grad:      46.3 mmHg AV Mean Grad:      27.2 mmHg LVOT Vmax:         89.90 cm/s LVOT Vmean:        68.100 cm/s LVOT VTI:          0.178 m LVOT/AV VTI ratio: 0.26  AORTA Ao Root diam: 3.10 cm Ao Asc diam:  3.50 cm  SHUNTS Systemic VTI:  0.18 m Systemic Diam: 2.30 cm Pulmonic VTI:  0.161 m Sunit Tolia Electronically signed by Madonna Large Signature Date/Time: 10/24/2023/1:56:25 PM    Final    EP STUDY Result Date: 10/24/2023 See surgical note for result.  ECHOCARDIOGRAM COMPLETE Result Date: 10/21/2023    ECHOCARDIOGRAM REPORT   Patient Name:   Kayla Schmitt [facility] Date of Exam: 10/21/2023 Medical Rec #:  [id number]              Height:       64.0 in Accession #:    [id number]             Weight:       221.8 lb Date of Birth:  03-25-1966               BSA:  2.044 m Patient Age:    57 years               BP:           125/63 mmHg Patient Gender: F                      HR:           87 bpm. Exam Location:  Inpatient Procedure: 2D Echo,  Cardiac Doppler and Color Doppler (Both Spectral and Color            Flow Doppler were utilized during procedure). Indications:    Elevated troponin  History:        Patient has prior history of Echocardiogram examinations, most                 recent 04/04/2023.  Sonographer:    Tinnie Gosling RDCS Referring Phys: [phone] SUBRINA SUNDIL IMPRESSIONS  1. Left ventricular ejection fraction, by estimation, is 50 to 55%. The left ventricle has low normal function. The left ventricle has no regional wall motion abnormalities. Left ventricular diastolic parameters are consistent with Grade II diastolic dysfunction (pseudonormalization).  2. Right ventricular systolic function was not well visualized. The right ventricular size is not well visualized.  3. The mitral valve is degenerative. Moderate mitral valve regurgitation. No evidence of mitral stenosis.  4. The aortic valve is normal in structure. There is severe calcifcation of the aortic valve. There is severe thickening of the aortic valve. Aortic valve regurgitation is mild. Mild to moderate aortic valve stenosis. Aortic valve mean gradient measures  17.0 mmHg. Aortic valve Vmax measures 2.66 m/s.  5. The inferior vena cava is normal in size with greater than 50% respiratory variability, suggesting right atrial pressure of 3 mmHg. Conclusion(s)/Recommendation(s): Extremely limited study with poor visualization. If endocarditis is suspected a TEE would be beneficial. FINDINGS  Left Ventricle: Left ventricular ejection fraction, by estimation, is 50 to 55%. The left ventricle has low normal function. The left ventricle has no regional wall motion abnormalities. The left ventricular internal cavity size was normal in size. There is no left ventricular hypertrophy. Left ventricular diastolic parameters are consistent with Grade II diastolic dysfunction (pseudonormalization).  LV Wall Scoring: Inferolateral wall hypokinesis. Right Ventricle: The right ventricular size is  not well visualized. Right vetricular wall thickness was not well visualized. Right ventricular systolic function was not well visualized. Left Atrium: Left atrial size was normal in size. Right Atrium: Right atrial size was normal in size. Pericardium: There is no evidence of pericardial effusion. Mitral Valve: The mitral valve is degenerative in appearance. Mild mitral annular calcification. Moderate mitral valve regurgitation. No evidence of mitral valve stenosis. Tricuspid Valve: The tricuspid valve is normal in structure. Tricuspid valve regurgitation is not demonstrated. No evidence of tricuspid stenosis. Aortic Valve: The aortic valve is normal in structure. There is severe calcifcation of the aortic valve. There is severe thickening of the aortic valve. Aortic valve regurgitation is mild. Mild to moderate aortic stenosis is present. Aortic valve mean gradient measures 17.0 mmHg. Aortic valve peak gradient measures 28.3 mmHg. Pulmonic Valve: The pulmonic valve was normal in structure. Pulmonic valve regurgitation is not visualized. No evidence of pulmonic stenosis. Aorta: The aortic root is normal in size and structure. Venous: The inferior vena cava is normal in size with greater than 50% respiratory variability, suggesting right atrial pressure of 3 mmHg. IAS/Shunts: No atrial level shunt detected by color flow Doppler.  LEFT VENTRICLE PLAX 2D LVIDd:         5.30 cm   Diastology LVIDs:         4.20 cm   LV e' medial:    6.20 cm/s LV PW:         1.10 cm   LV E/e' medial:  15.9 LV IVS:        1.10 cm   LV e' lateral:   7.94 cm/s LVOT diam:     2.00 cm   LV E/e' lateral: 12.4 LVOT Area:     3.14 cm  LEFT ATRIUM         Index LA diam:    3.90 cm 1.91 cm/m  AORTIC VALVE AV Vmax:      266.00 cm/s AV Vmean:     182.000 cm/s AV VTI:       0.489 m AV Peak Grad: 28.3 mmHg AV Mean Grad: 17.0 mmHg  AORTA Ao Root diam: 2.40 cm Ao Asc diam:  3.30 cm MITRAL VALVE MV Area (PHT): 3.40 cm     SHUNTS MV Decel Time: 223  msec     Systemic Diam: 2.00 cm MV E velocity: 98.50 cm/s MV A velocity: 103.00 cm/s MV E/A ratio:  0.96 Morene Brownie Electronically signed by Morene Brownie Signature Date/Time: 10/21/2023/8:46:33 PM    Final    MR TIBIA FIBULA LEFT WO CONTRAST Result Date: 10/21/2023 CLINICAL DATA:  Left foot/leg pain blistering. Patient is status post bilateral transmetatarsal amputation. EXAM: MRI OF LOWER LEFT EXTREMITY WITHOUT CONTRAST TECHNIQUE: Multiplanar, multisequence MR imaging of the left tibia/fibula was performed. No intravenous contrast was administered. COMPARISON:  Left tibia/fibula radiographs dated 12/17/2021. FINDINGS: Bones/Joint/Cartilage No fracture or dislocation. Normal alignment. No joint effusion. No marrow signal abnormality. Partially visualized left knee joint effusion. Ligaments Grossly intact. Soft tissue/ Muscles and Tendons Diffuse T2/STIR hyperintense signal of the anterior and posterior compartment musculature of the left shin/calf, most compatible with edema. Perifascial edema extending along the deep intramuscular fascia of the anterior and posterior compartment musculature as well as the deep to the medial gastrocnemius muscle. There is relatively diffuse mild-to-moderate fatty atrophy of the anterior and posterior compartment musculature. Diffuse circumferential subcutaneous edema. No discrete loculated subcutaneous fluid collection. Blistering extending along the posteromedial ankle, measuring up to 4.9 x 1.3 cm in axial dimension. Limited evaluation of the right shin/calf on large field-of-view coronal imaging demonstrates relatively diffuse intramuscular edema with less pronounced perifascial edema. IMPRESSION: 1. iffuse intramuscular edema of the anterior and posterior compartment musculature of the left shin/calf extending from the knee through the ankle with overlying perifascial edema along the deep intramuscular fascia as well as deep to the medial gastrocnemius muscle. There is  overlying diffuse circumferential subcutaneous edema. No loculated subcutaneous fluid collection. These findings are nonspecific. Cellulitis or fasciitis cannot be excluded, clinical correlation is recommended. Limited evaluation of the right shin/calf on large field-of-view coronal imaging demonstrates relatively diffuse intramuscular edema with less pronounced perifascial edema. 2. Blistering extending along the left posteromedial ankle. 3. Mild-to-moderate fatty atrophy of the left shin/calf anterior and posterior compartment musculature. Similar mild-to-moderate fatty atrophy of the right shin/calf is noted on large field-of-view coronal images. 4. No acute osseous abnormality. 5. Partially visualized left knee joint effusion. Electronically Signed   By: Harrietta Sherry M.D.   On: 10/21/2023 13:00      Subjective: Denies complaints. Has used occasional doses of IV Dilaudid  in addition to oral opioids, around her therapies. Advised that she take oral  opioids 30 minutes before therapies. Had a large BM yesterday. No other complaints. Eager to move on with rehab.   Discharge Exam:  Vitals:   10/28/23 0427 10/28/23 0837 10/28/23 2031 10/29/23 0742  BP: (!) 136/55 132/60 (!) 138/49 (!) 141/62  Pulse: 78 80 72 74  Resp: 18 18 18 18   Temp: 98.8 F (37.1 C) 98 F (36.7 C) 97.7 F (36.5 C)   TempSrc:   Oral   SpO2: 98% 99% 99% 96%  Weight:      Height:        General exam: Young female, moderately built and nourished lying comfortably propped up in bed without distress.  Appears to be in good spirits. Respiratory system: Clear to auscultation.  No increased work of breathing. Cardiovascular system: S1 & S2 heard, RRR. No JVD, murmurs, rubs, gallops or clicks. No right lower extremity pedal edema.  Off of telemetry. Gastrointestinal system: Abdomen is nondistended, soft and nontender. No organomegaly or masses felt. Normal bowel sounds heard. Central nervous system: Alert and oriented. No  focal neurological deficits. Extremities: Symmetric 5 x 5 power.  Healed right transmetatarsal amputation stump.  Has heel protector on right lower extremity.  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  Also has a splint on her left lower extremity since postop.  0 drain output documented in the last 24 hours. Skin: No rashes, lesions or ulcers Psychiatry: Judgement and insight appear normal. Mood & affect appropriate.     The results of significant diagnostics from this hospitalization (including imaging, microbiology, ancillary and laboratory) are listed below for reference.     Microbiology: Recent Results (from the past 240 hours)  Culture, blood (Routine X 2) w Reflex to ID Panel     Status: None   Collection Time: 10/20/23 10:00 PM   Specimen: BLOOD LEFT HAND  Result Value Ref Range Status   Specimen Description BLOOD LEFT HAND  Final   Special Requests   Final    BOTTLES DRAWN AEROBIC AND ANAEROBIC Blood Culture adequate volume   Culture   Final    NO GROWTH 5 DAYS Performed at [facility], [address] [place], [place] [address]    Report Status 10/25/2023 FINAL  Final  Culture, blood (Routine X 2) w Reflex to ID Panel     Status: None   Collection Time: 10/20/23 10:02 PM   Specimen: BLOOD LEFT ARM  Result Value Ref Range Status   Specimen Description BLOOD LEFT ARM  Final   Special Requests   Final    BOTTLES DRAWN AEROBIC AND ANAEROBIC Blood Culture adequate volume   Culture   Final    NO GROWTH 5 DAYS Performed at [facility], [address] [place], [place] [address]    Report Status 10/25/2023 FINAL  Final  Surgical pcr screen     Status: None   Collection Time: 10/21/23  9:19 PM   Specimen: Nasal Mucosa; Nasal Swab  Result Value Ref Range Status   MRSA, PCR NEGATIVE NEGATIVE Final   Staphylococcus aureus NEGATIVE NEGATIVE Final    Comment: (NOTE) The Xpert SA Assay (FDA approved for  NASAL specimens in patients 52 years of age and older), is one component of a comprehensive surveillance program. It is not intended to diagnose infection nor to guide or monitor treatment. Performed at [facility], [address] [place], [place] [address]   Aerobic/Anaerobic Culture w Gram  Stain (surgical/deep wound)     Status: None   Collection Time: 10/23/23 11:39 AM   Specimen: PATH Soft tissue  Result Value Ref Range Status   Specimen Description TISSUE  Final   Special Requests A  Final   Gram Stain NO WBC SEEN NO ORGANISMS SEEN   Final   Culture   Final    No growth aerobically or anaerobically. Performed at [facility], [address] [place], [place] [address]    Report Status 10/28/2023 FINAL  Final     Labs: CBC: Recent Labs  Lab 10/23/23 0435 10/24/23 0438 10/25/23 0526 10/26/23 0441  WBC 9.5 10.1 10.1 9.0  HGB 7.9* 7.6* 7.6* 7.9*  HCT 24.8* 24.8* 24.8* 25.2*  MCV 90.5 92.9 92.5 92.6  PLT 260 268 287 287    Basic Metabolic Panel: Recent Labs  Lab 10/23/23 0435 10/24/23 0438 10/25/23 0526  NA 135 138 139  K 4.1 3.5 3.9  CL 109 110 108  CO2 22 21* 23  GLUCOSE 187* 115* 102*  BUN 19 19 13   CREATININE 1.07* 1.15* 0.99  CALCIUM  7.3* 7.6* 7.8*  MG  --   --  2.2    Liver Function Tests: Recent Labs  Lab 10/23/23 0435 10/24/23 0438  AST 35 50*  ALT 28 26  ALKPHOS 171* 176*  BILITOT 0.7 0.4  PROT 5.2* 5.3*  ALBUMIN  <1.5* <1.5*    CBG: Recent Labs  Lab 10/28/23 0756 10/28/23 1148 10/28/23 1654 10/28/23 2030 10/29/23 0740  GLUCAP 123* 163* 125* 136* 89    Hgb A1c Recent Labs    10/28/23 1940  HGBA1C 5.5     Urinalysis    Component Value Date/Time   COLORURINE YELLOW 10/22/2023 0609   APPEARANCEUR CLOUDY (A) 10/22/2023 0609   LABSPEC 1.016 10/22/2023 0609   PHURINE 5.0 10/22/2023 0609   GLUCOSEU NEGATIVE 10/22/2023 0609   HGBUR SMALL (A) 10/22/2023 0609   BILIRUBINUR NEGATIVE 10/22/2023 0609   KETONESUR  NEGATIVE 10/22/2023 0609   PROTEINUR NEGATIVE 10/22/2023 0609   NITRITE NEGATIVE 10/22/2023 0609   LEUKOCYTESUR NEGATIVE 10/22/2023 9390      Time coordinating discharge: 40 minutes  SIGNED:  Trenda Mar, MD,  FACP, [facility], [facility], [facility]   [facility] [place]     To contact the attending provider between 7A-7P or the covering provider during after hours 7P-7A, please log into the web site [email or web address] and access using universal [place] password for that web site. If you do not have the password, please call the hospital operator.

## 2023-10-29 NOTE — Plan of Care (Signed)
 Problem: Education: Goal: Knowledge of General Education information will improve Description: Including pain rating scale, medication(s)/side effects and non-pharmacologic comfort measures 10/29/2023 0339 by Vinie Ginny PARAS, RN Outcome: Progressing 10/29/2023 0339 by Vinie Ginny PARAS, RN Outcome: Progressing   Problem: Health Behavior/Discharge Planning: Goal: Ability to manage health-related needs will improve 10/29/2023 0339 by Vinie Ginny PARAS, RN Outcome: Progressing 10/29/2023 0339 by Vinie Ginny PARAS, RN Outcome: Progressing   Problem: Clinical Measurements: Goal: Ability to maintain clinical measurements within normal limits will improve 10/29/2023 0339 by Vinie Ginny PARAS, RN Outcome: Progressing 10/29/2023 0339 by Vinie Ginny PARAS, RN Outcome: Progressing Goal: Will remain free from infection 10/29/2023 0339 by Vinie Ginny PARAS, RN Outcome: Progressing 10/29/2023 0339 by Vinie Ginny PARAS, RN Outcome: Progressing Goal: Diagnostic test results will improve 10/29/2023 0339 by Vinie Ginny PARAS, RN Outcome: Progressing 10/29/2023 0339 by Vinie Ginny PARAS, RN Outcome: Progressing Goal: Respiratory complications will improve 10/29/2023 0339 by Vinie Ginny PARAS, RN Outcome: Progressing 10/29/2023 0339 by Vinie Ginny PARAS, RN Outcome: Progressing Goal: Cardiovascular complication will be avoided 10/29/2023 0339 by Vinie Ginny PARAS, RN Outcome: Progressing 10/29/2023 0339 by Vinie Ginny PARAS, RN Outcome: Progressing   Problem: Activity: Goal: Risk for activity intolerance will decrease 10/29/2023 0339 by Vinie Ginny PARAS, RN Outcome: Progressing 10/29/2023 0339 by Vinie Ginny PARAS, RN Outcome: Progressing   Problem: Nutrition: Goal: Adequate nutrition will be maintained 10/29/2023 0339 by Vinie Ginny PARAS, RN Outcome: Progressing 10/29/2023 0339 by Vinie Ginny PARAS, RN Outcome: Progressing   Problem: Coping: Goal: Level of anxiety will decrease 10/29/2023 0339 by Vinie Ginny PARAS, RN Outcome:  Progressing 10/29/2023 0339 by Vinie Ginny PARAS, RN Outcome: Progressing   Problem: Elimination: Goal: Will not experience complications related to bowel motility 10/29/2023 0339 by Vinie Ginny PARAS, RN Outcome: Progressing 10/29/2023 0339 by Vinie Ginny PARAS, RN Outcome: Progressing Goal: Will not experience complications related to urinary retention 10/29/2023 0339 by Vinie Ginny PARAS, RN Outcome: Progressing 10/29/2023 0339 by Vinie Ginny PARAS, RN Outcome: Progressing   Problem: Pain Managment: Goal: General experience of comfort will improve and/or be controlled 10/29/2023 0339 by Vinie Ginny PARAS, RN Outcome: Progressing 10/29/2023 0339 by Vinie Ginny PARAS, RN Outcome: Progressing   Problem: Safety: Goal: Ability to remain free from injury will improve 10/29/2023 0339 by Vinie Ginny PARAS, RN Outcome: Progressing 10/29/2023 0339 by Vinie Ginny PARAS, RN Outcome: Progressing   Problem: Skin Integrity: Goal: Risk for impaired skin integrity will decrease 10/29/2023 0339 by Vinie Ginny PARAS, RN Outcome: Progressing 10/29/2023 0339 by Vinie Ginny PARAS, RN Outcome: Progressing   Problem: Education: Goal: Ability to describe self-care measures that may prevent or decrease complications (Diabetes Survival Skills Education) will improve 10/29/2023 0339 by Vinie Ginny PARAS, RN Outcome: Progressing 10/29/2023 0339 by Vinie Ginny PARAS, RN Outcome: Progressing Goal: Individualized Educational Video(s) 10/29/2023 0339 by Vinie Ginny PARAS, RN Outcome: Progressing 10/29/2023 0339 by Vinie Ginny PARAS, RN Outcome: Progressing   Problem: Coping: Goal: Ability to adjust to condition or change in health will improve 10/29/2023 0339 by Vinie Ginny PARAS, RN Outcome: Progressing 10/29/2023 0339 by Vinie Ginny PARAS, RN Outcome: Progressing   Problem: Fluid Volume: Goal: Ability to maintain a balanced intake and output will improve 10/29/2023 0339 by Vinie Ginny PARAS, RN Outcome: Progressing 10/29/2023 0339 by  Vinie Ginny PARAS, RN Outcome: Progressing   Problem: Health Behavior/Discharge Planning: Goal: Ability to identify and utilize available resources and services will improve 10/29/2023 0339 by Vinie Ginny PARAS, RN Outcome: Progressing 10/29/2023 0339 by  Vinie Ginny PARAS, RN Outcome: Progressing Goal: Ability to manage health-related needs will improve 10/29/2023 0339 by Vinie Ginny PARAS, RN Outcome: Progressing 10/29/2023 0339 by Vinie Ginny PARAS, RN Outcome: Progressing   Problem: Metabolic: Goal: Ability to maintain appropriate glucose levels will improve 10/29/2023 0339 by Vinie Ginny PARAS, RN Outcome: Progressing 10/29/2023 0339 by Vinie Ginny PARAS, RN Outcome: Progressing   Problem: Nutritional: Goal: Maintenance of adequate nutrition will improve 10/29/2023 0339 by Vinie Ginny PARAS, RN Outcome: Progressing 10/29/2023 0339 by Vinie Ginny PARAS, RN Outcome: Progressing Goal: Progress toward achieving an optimal weight will improve 10/29/2023 0339 by Vinie Ginny PARAS, RN Outcome: Progressing 10/29/2023 0339 by Vinie Ginny PARAS, RN Outcome: Progressing   Problem: Skin Integrity: Goal: Risk for impaired skin integrity will decrease 10/29/2023 0339 by Vinie Ginny PARAS, RN Outcome: Progressing 10/29/2023 0339 by Vinie Ginny PARAS, RN Outcome: Progressing   Problem: Tissue Perfusion: Goal: Adequacy of tissue perfusion will improve 10/29/2023 0339 by Vinie Ginny PARAS, RN Outcome: Progressing 10/29/2023 0339 by Vinie Ginny PARAS, RN Outcome: Progressing   Problem: Fluid Volume: Goal: Hemodynamic stability will improve 10/29/2023 0339 by Vinie Ginny PARAS, RN Outcome: Progressing 10/29/2023 0339 by Vinie Ginny PARAS, RN Outcome: Progressing   Problem: Clinical Measurements: Goal: Diagnostic test results will improve 10/29/2023 0339 by Vinie Ginny PARAS, RN Outcome: Progressing 10/29/2023 0339 by Vinie Ginny PARAS, RN Outcome: Progressing Goal: Signs and symptoms of infection will decrease 10/29/2023 0339  by Vinie Ginny PARAS, RN Outcome: Progressing 10/29/2023 0339 by Vinie Ginny PARAS, RN Outcome: Progressing   Problem: Respiratory: Goal: Ability to maintain adequate ventilation will improve 10/29/2023 0339 by Vinie Ginny PARAS, RN Outcome: Progressing 10/29/2023 0339 by Vinie Ginny PARAS, RN Outcome: Progressing   Problem: Education: Goal: Knowledge of the prescribed therapeutic regimen will improve 10/29/2023 0339 by Vinie Ginny PARAS, RN Outcome: Progressing 10/29/2023 0339 by Vinie Ginny PARAS, RN Outcome: Progressing Goal: Ability to verbalize activity precautions or restrictions will improve 10/29/2023 0339 by Vinie Ginny PARAS, RN Outcome: Progressing 10/29/2023 0339 by Vinie Ginny PARAS, RN Outcome: Progressing Goal: Understanding of discharge needs will improve 10/29/2023 0339 by Vinie Ginny PARAS, RN Outcome: Progressing 10/29/2023 0339 by Vinie Ginny PARAS, RN Outcome: Progressing   Problem: Activity: Goal: Ability to perform//tolerate increased activity and mobilize with assistive devices will improve 10/29/2023 0339 by Vinie Ginny PARAS, RN Outcome: Progressing 10/29/2023 0339 by Vinie Ginny PARAS, RN Outcome: Progressing   Problem: Clinical Measurements: Goal: Postoperative complications will be avoided or minimized 10/29/2023 0339 by Vinie Ginny PARAS, RN Outcome: Progressing 10/29/2023 0339 by Vinie Ginny PARAS, RN Outcome: Progressing   Problem: Self-Care: Goal: Ability to meet self-care needs will improve 10/29/2023 0339 by Vinie Ginny PARAS, RN Outcome: Progressing 10/29/2023 0339 by Vinie Ginny PARAS, RN Outcome: Progressing   Problem: Self-Concept: Goal: Ability to maintain and perform role responsibilities to the fullest extent possible will improve 10/29/2023 0339 by Vinie Ginny PARAS, RN Outcome: Progressing 10/29/2023 0339 by Vinie Ginny PARAS, RN Outcome: Progressing   Problem: Pain Management: Goal: Pain level will decrease with appropriate interventions 10/29/2023 0339 by Vinie Ginny PARAS, RN Outcome: Progressing 10/29/2023 0339 by Vinie Ginny PARAS, RN Outcome: Progressing   Problem: Skin Integrity: Goal: Demonstration of wound healing without infection will improve 10/29/2023 0339 by Vinie Ginny PARAS, RN Outcome: Progressing 10/29/2023 0339 by Vinie Ginny PARAS, RN Outcome: Progressing

## 2023-10-29 NOTE — Progress Notes (Signed)
 Inpatient Rehabilitation Admission Medication Review by a Pharmacist  A complete drug regimen review was completed for this patient to identify any potential clinically significant medication issues.  High Risk Drug Classes Is patient taking? Indication by Medication  Antipsychotic Yes Compazine PRN- n/v   Anticoagulant Yes Lovenox - VTE ppx   Antibiotic Yes, as an intravenous medication Daptomycin - MRSA bacteremia (thru 11/17/23)  Opioid Yes Percocet- pain   Antiplatelet Yes Plavix  - PAD   Hypoglycemics/insulin  Yes Insulin - DM   Vasoactive Medication No   Chemotherapy No   Other Yes fleet enema , bisacodyl  , Senokot-S , and - constipation Maalox- indigestion Pantoprazole - reflux  Diphenhydramine - itching  Acetaminophen - pain  Robitussin- cough  Robaxin- muscle spasms   melatonin and -insomnia Atorvastatin - HLD  Gabapentin - neuropathy  Ferrous sulfate - supplement  Synthroid - hypothyroidism  Tigan - n/v      Type of Medication Issue Identified Description of Issue Recommendation(s)  Drug Interaction(s) (clinically significant)     Duplicate Therapy     Allergy     No Medication Administration End Date     Incorrect Dose     Additional Drug Therapy Needed     Significant med changes from prior encounter (inform family/care partners about these prior to discharge). PTA meds not resumed- Lasix  , toprol  12.5, baby aspirin , nitroglycerin   Communicate relevant medication changes to patient/family members at discharge from CIR.   Restart or discontinue PTA meds not resumed in CIR at discharge if clinically indicated.   Other       Clinically significant medication issues were identified that warrant physician communication and completion of prescribed/recommended actions by midnight of the next day:  No  Name of provider notified for urgent issues identified:   Provider Method of Notification:     Pharmacist comments:   Time spent performing this drug regimen review  (minutes):  20  Massie Fila, PharmD Clinical Pharmacist  10/29/2023 2:37 PM

## 2023-10-29 NOTE — Discharge Instructions (Addendum)
 Inpatient Rehab Discharge Instructions  Kayla Schmitt Discharge date and time:    Activities/Precautions/ Functional Status: Activity: no lifting, driving, or strenuous exercise  till cleared by MD Diet: cardiac diet and diabetic diet Wound Care: keep wound clean and dry   Functional status:  ___ No restrictions     ___ Walk up steps independently ___ 24/7 supervision/assistance   ___ Walk up steps with assistance ___ Intermittent supervision/assistance  ___ Bathe/dress independently ___ Walk with walker     ___ Bathe/dress with assistance ___ Walk Independently    ___ Shower independently ___ Walk with assistance    ___ Shower with assistance _X__ No alcohol     ___ Return to work/school ________  Special Instructions:    COMMUNITY REFERRALS UPON DISCHARGE:    Home Health:   [facility]   [phone]                [facility] [phone]   Medical Equipment/Items Ordered:[facility] 02-CONCENTRATOR AND PORTABLE TANK                                                 Agency/Supplier:[facility]   [phone]    My questions have been answered and I understand these instructions. I will adhere to these goals and the provided educational materials after my discharge from the hospital.  Patient/Caregiver Signature _______________________________ Date __________  Clinician Signature _______________________________________ Date __________  Please bring this form and your medication list with you to all your follow-up doctor's appointments.   Carbohydrate Counting For People With Diabetes  Foods with carbohydrates make your blood glucose level go up. Learning how to count carbohydrates can help you control your blood glucose levels. First, identify the foods you eat that contain carbohydrates. Then, using the Foods with Carbohydrates chart, determine about how much carbohydrates are in your meals and snacks. Make sure  you are eating foods with fiber, protein, and healthy fat along with your carbohydrate foods. Foods with Carbohydrates The following table shows carbohydrate foods that have about 15 grams of carbohydrate each. Using measuring cups, spoons, or a food scale when you first begin learning about carbohydrate counting can help you learn about the portion sizes you typically eat. The following foods have 15 grams carbohydrate each:  Grains 1 slice bread (1 ounce)  1 small tortilla (6-inch size)   large bagel (1 ounce)  1/3 cup pasta or rice (cooked)   hamburger or hot dog bun ( ounce)   cup cooked cereal   to  cup ready-to-eat cereal  2 taco shells (5-inch size) Fruit 1 small fresh fruit ( to 1 cup)   medium banana  17 small grapes (3 ounces)  1 cup melon or berries   cup canned or frozen fruit  2 tablespoons dried fruit (blueberries, cherries, cranberries, raisins)   cup unsweetened fruit juice  Starchy Vegetables  cup cooked beans, peas, corn, potatoes/sweet potatoes   large baked potato (3 ounces)  1 cup acorn or butternut squash  Snack Foods 3 to 6 crackers  8 potato chips or 13 tortilla chips ( ounce to 1 ounce)  3 cups popped popcorn  Dairy 3/4 cup (6 ounces) nonfat plain yogurt, or yogurt with sugar-free sweetener  1 cup milk  1 cup plain rice, soy, coconut or flavored almond milk Sweets  and Desserts  cup ice cream or frozen yogurt  1 tablespoon jam, jelly, pancake syrup, table sugar, or honey  2 tablespoons light pancake syrup  1 inch square of frosted cake or 2 inch square of unfrosted cake  2 small cookies (2/3 ounce each) or  large cookie  Sometimes you'll have to estimate carbohydrate amounts if you don't know the exact recipe. One cup of mixed foods like soups can have 1 to 2 carbohydrate servings, while some casseroles might have 2 or more servings of carbohydrate. Foods that have less than 20 calories in each serving can be counted as "free" foods. Count 1  cup raw vegetables, or  cup cooked non-starchy vegetables as "free" foods. If you eat 3 or more servings at one meal, then count them as 1 carbohydrate serving.  Foods without Carbohydrates  Not all foods contain carbohydrates. Meat, some dairy, fats, non-starchy vegetables, and many beverages don't contain carbohydrate. So when you count carbohydrates, you can generally exclude chicken, pork, beef, fish, seafood, eggs, tofu, cheese, butter, sour cream, avocado, nuts, seeds, olives, mayonnaise, water, black coffee, unsweetened tea, and zero-calorie drinks. Vegetables with no or low carbohydrate include green beans, cauliflower, tomatoes, and onions. How much carbohydrate should I eat at each meal?  Carbohydrate counting can help you plan your meals and manage your weight. Following are some starting points for carbohydrate intake at each meal. Work with your registered dietitian nutritionist to find the best range that works for your blood glucose and weight.   To Lose Weight To Maintain Weight  Women 2 - 3 carb servings 3 - 4 carb servings  Men 3 - 4 carb servings 4 - 5 carb servings  Checking your blood glucose after meals will help you know if you need to adjust the timing, type, or number of carbohydrate servings in your meal plan. Achieve and keep a healthy body weight by balancing your food intake and physical activity.  Tips How should I plan my meals?  Plan for half the food on your plate to include non-starchy vegetables, like salad greens, broccoli, or carrots. Try to eat 3 to 5 servings of non-starchy vegetables every day. Have a protein food at each meal. Protein foods include chicken, fish, meat, eggs, or beans (note that beans contain carbohydrate). These two food groups (non-starchy vegetables and proteins) are low in carbohydrate. If you fill up your plate with these foods, you will eat less carbohydrate but still fill up your stomach. Try to limit your carbohydrate portion to  of the  plate.  What fats are healthiest to eat?  Diabetes increases risk for heart disease. To help protect your heart, eat more healthy fats, such as olive oil, nuts, and avocado. Eat less saturated fats like butter, cream, and high-fat meats, like bacon and sausage. Avoid trans fats, which are in all foods that list "partially hydrogenated oil" as an ingredient. What should I drink?  Choose drinks that are not sweetened with sugar. The healthiest choices are water, carbonated or seltzer waters, and tea and coffee without added sugars.  Sweet drinks will make your blood glucose go up very quickly. One serving of soda or energy drink is  cup. It is best to drink these beverages only if your blood glucose is low.  Artificially sweetened, or diet drinks, typically do not increase your blood glucose if they have zero calories in them. Read labels of beverages, as some diet drinks do have carbohydrate and will raise your blood glucose.  Label Reading Tips Read Nutrition Facts labels to find out how many grams of carbohydrate are in a food you want to eat. Don't forget: sometimes serving sizes on the label aren't the same as how much food you are going to eat, so you may need to calculate how much carbohydrate is in the food you are serving yourself.   Carbohydrate Counting for People with Diabetes Sample 1-Day Menu  Breakfast  cup yogurt, low fat, low sugar (1 carbohydrate serving)   cup cereal, ready-to-eat, unsweetened (1 carbohydrate serving)  1 cup strawberries (1 carbohydrate serving)   cup almonds ( carbohydrate serving)  Lunch 1, 5 ounce can chunk light tuna  2 ounces cheese, low fat cheddar  6 whole wheat crackers (1 carbohydrate serving)  1 small apple (1 carbohydrate servings)   cup carrots ( carbohydrate serving)   cup snap peas  1 cup 1% milk (1 carbohydrate serving)   Evening Meal Stir fry made with: 3 ounces chicken  1 cup brown rice (3 carbohydrate servings)   cup broccoli (  carbohydrate serving)   cup green beans   cup onions  1 tablespoon olive oil  2 tablespoons teriyaki sauce ( carbohydrate serving)  Evening Snack 1 extra small banana (1 carbohydrate serving)  1 tablespoon peanut butter   Carbohydrate Counting for People with Diabetes Vegan Sample 1-Day Menu  Breakfast 1 cup cooked oatmeal (2 carbohydrate servings)   cup blueberries (1 carbohydrate serving)  2 tablespoons flaxseeds  1 cup soymilk fortified with calcium  and vitamin D   1 cup coffee  Lunch 2 slices whole wheat bread (2 carbohydrate servings)   cup baked tofu   cup lettuce  2 slices tomato  2 slices avocado   cup baby carrots ( carbohydrate serving)  1 orange (1 carbohydrate serving)  1 cup soymilk fortified with calcium  and vitamin D    Evening Meal Burrito made with: 1 6-inch corn tortilla (1 carbohydrate serving)  1 cup refried vegetarian beans (2 carbohydrate servings)   cup chopped tomatoes   cup lettuce   cup salsa  1/3 cup brown rice (1 carbohydrate serving)  1 tablespoon olive oil for rice   cup zucchini   Evening Snack 6 small whole grain crackers (1 carbohydrate serving)  2 apricots ( carbohydrate serving)   cup unsalted peanuts ( carbohydrate serving)    Carbohydrate Counting for People with Diabetes Vegetarian (Lacto-Ovo) Sample 1-Day Menu  Breakfast 1 cup cooked oatmeal (2 carbohydrate servings)   cup blueberries (1 carbohydrate serving)  2 tablespoons flaxseeds  1 egg  1 cup 1% milk (1 carbohydrate serving)  1 cup coffee  Lunch 2 slices whole wheat bread (2 carbohydrate servings)  2 ounces low-fat cheese   cup lettuce  2 slices tomato  2 slices avocado   cup baby carrots ( carbohydrate serving)  1 orange (1 carbohydrate serving)  1 cup unsweetened tea  Evening Meal Burrito made with: 1 6-inch corn tortilla (1 carbohydrate serving)   cup refried vegetarian beans (1 carbohydrate serving)   cup tomatoes   cup lettuce   cup salsa  1/3  cup brown rice (1 carbohydrate serving)  1 tablespoon olive oil for rice   cup zucchini  1 cup 1% milk (1 carbohydrate serving)  Evening Snack 6 small whole grain crackers (1 carbohydrate serving)  2 apricots ( carbohydrate serving)   cup unsalted peanuts ( carbohydrate serving)    Copyright 2020  [employer]. All rights reserved.  Using Nutrition Labels: Carbohydrate  Serving Size  Look at the serving size. All the information on the label is based on this portion. Servings Per Container  The number of servings contained in the package. Guidelines for Carbohydrate  Look at the total grams of carbohydrate in the serving size.  1 carbohydrate choice = 15 grams of carbohydrate. Range of Carbohydrate Grams Per Choice  Carbohydrate Grams/Choice Carbohydrate Choices  6-10   11-20 1  21-25 1  26-35 2  36-40 2  41-50 3  51-55 3  56-65 4  66-70 4  71-80 5    Copyright 2020  [employer]. All rights reserved.

## 2023-10-29 NOTE — H&P (Signed)
 Physical Medicine and Rehabilitation Admission H&P    CC: Functional deficits due to L-BKA. Hx of right transmetatarsal amputation.     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 She was found to have lactic acidosis with elevated troponin, anemia requiring one unit PRBC and no clear source of infection found. She was transferred to [facility] for management and MRI LLE showed diffuse edema anterior and posterior compartment of left shin/calf extending from knee thorough ankle as well as deep intramuscular fascia as well as deep to medial gastroc muscle question cellulitis or fasciitis and blistering extending along left posteromedial ankle.  ID recommended  continuing broad spectrum antibiotics through surgery. Exam concerning for necrotizing fasciitis and she underwent L-BKA with open flab wrapped with wound VAC on 06/24 by Dr. Silver followed by revision BKA on 06/25 by Dr. Harden.     TEE done 06/26 revealing normal LVEF 60-65% which was negative for thrombus or endocarditis, negative for shunting, showed  moderate to severe aortic stenosis w/mild to moderate AR, mild to moderate MR and mild layered plaque involving descending aorta. Antibiotics narrowed to IV Daptomycin  thru 11/17/23 --4 weeks from negative [facility] 06/22.  Mentation has improved with improvement in pain but continues to have phantom pain also. PT/OT consulted and has been working with patient who requires mod assist X 2 for stand pivot transfers and +2 mod assist with ADLs. She lives alone in [place], was independent and  working PTA. CIR recommended due to functional decline.    Pain creeping back up- last pain meds at 9am and currently 2:30pm.  When at rest with meds, pain 3-4/10 and when doing therapy 11-12/10. Has a lot of phantom pain- feels like on fire. Used ot take gabapentin  300 mg at bedtime, but wa son 100 mg TID. For RLS at home.   LBM today first one since surgery yesterday AM  Peeing OK- been using Purewick- will to try and stop tomorrow.   Has a long term/chronic sore on R TMA that's draining slightly.   Review of Systems  Constitutional:  Positive for malaise/fatigue. Negative for chills and fever.  HENT:  Negative for hearing loss and tinnitus.   Eyes:  Negative for blurred vision and double vision.  Respiratory:  Negative for cough and shortness of breath.   Cardiovascular:  Positive for leg swelling. Negative for chest pain and palpitations.       BLE give out with increased activity  Gastrointestinal:  Negative for constipation, heartburn and nausea.       First BM since surgery was yesterday, but finally bowels moving.  Genitourinary:  Negative for dysuria and urgency.  Musculoskeletal:  Negative for myalgias and neck pain.  Neurological:  Positive for sensory change (feet numb. Has phatom pain LLE) and weakness. Negative for dizziness and headaches.  Psychiatric/Behavioral:  The patient is not nervous/anxious and does not have insomnia.   All other systems reviewed and are negative.    Past Medical History:  Diagnosis Date   Anemia    Aortic stenosis    Arthritis  CHF (congestive heart failure) (HCC)    Coronary artery disease    Diabetes mellitus without complication (HCC)    type 2   GERD (gastroesophageal reflux disease)    Heart murmur    History of blood transfusion    Hypercholesteremia    Hypertension    Hypothyroidism    Mitral regurgitation    Myocardial infarction (HCC)    Peripheral vascular disease (HCC)    PONV (postoperative nausea and vomiting)     Thyroid disease     Past Surgical History:  Procedure Laterality Date   ABDOMINAL AORTOGRAM W/LOWER EXTREMITY Bilateral 01/14/2020   Procedure: ABDOMINAL AORTOGRAM W/LOWER EXTREMITY;  Surgeon: Gretta Lonni PARAS, MD;  Location: [facility] INVAS[facility] LAB;  Service: Cardiovascular;  Laterality: Bilateral;   ABDOMINAL AORTOGRAM W/LOWER EXTREMITY N/A 07/05/2022   Procedure: ABDOMINAL AORTOGRAM W/LOWER EXTREMITY;  Surgeon: Gretta Lonni PARAS, MD;  Location: [facility];  Service: Cardiovascular;  Laterality: N/A;   ABDOMINAL AORTOGRAM W/LOWER EXTREMITY N/A 08/02/2022   Procedure: ABDOMINAL AORTOGRAM W/LOWER EXTREMITY;  Surgeon: Gretta Lonni PARAS, MD;  Location: [facility];  Service: Cardiovascular;  Laterality: N/A;   ABDOMINAL AORTOGRAM W/LOWER EXTREMITY N/A 08/06/2022   Procedure: ABDOMINAL AORTOGRAM W/LOWER EXTREMITY;  Surgeon: Gretta Lonni PARAS, MD;  Location: [facility];  Service: Cardiovascular;  Laterality: N/A;   AMPUTATION Bilateral 07/20/2022   Procedure: AMPUTATION RIGHT GREAT TOE AND SECOND TOE, AMPUTATION LEFT GREAT TOE;  Surgeon: Harden Jerona GAILS, MD;  Location: [facility] OR;  Service: Orthopedics;  Laterality: Bilateral;   AMPUTATION Bilateral 08/08/2022   Procedure: BILATERAL TRANSMETATARSAL AMPUTATION;  Surgeon: Harden Jerona GAILS, MD;  Location: [facility] OR;  Service: Orthopedics;  Laterality: Bilateral;   AMPUTATION Left 10/22/2023   Procedure: AMPUTATION BELOW KNEE;  Surgeon: Lanis Fonda BRAVO, MD;  Location: [facility] OR;  Service: Vascular;  Laterality: Left;   AMPUTATION TOE     BIOPSY  04/06/2023   Procedure: BIOPSY;  Surgeon: Chalonda Elspeth SQUIBB, MD;  Location: [facility] ENDOSCOPY;  Service: Gastroenterology;;   COLONOSCOPY WITH PROPOFOL  N/A 04/06/2023   Procedure: COLONOSCOPY WITH PROPOFOL ;  Surgeon: Kailia Elspeth SQUIBB, MD;  Location: [facility] ENDOSCOPY;  Service: Gastroenterology;  Laterality: N/A;   ESOPHAGOGASTRODUODENOSCOPY (EGD) WITH PROPOFOL  N/A 04/06/2023   Procedure:  ESOPHAGOGASTRODUODENOSCOPY (EGD) WITH PROPOFOL ;  Surgeon: Tyreisha Elspeth SQUIBB, MD;  Location: [facility] ENDOSCOPY;  Service: Gastroenterology;  Laterality: N/A;   GIVENS CAPSULE STUDY N/A 04/06/2023   Procedure: GIVENS CAPSULE STUDY;  Surgeon: Daren Elspeth SQUIBB, MD;  Location: [facility] ENDOSCOPY;  Service: Gastroenterology;  Laterality: N/A;   PERIPHERAL VASCULAR ATHERECTOMY Right 01/14/2020   Procedure: PERIPHERAL VASCULAR ATHERECTOMY;  Surgeon: Gretta Lonni PARAS, MD;  Location: [facility] INVASIVE CV LAB;  Service: Cardiovascular;  Laterality: Right;  sfa   PERIPHERAL VASCULAR INTERVENTION Right 01/14/2020   Procedure: PERIPHERAL VASCULAR INTERVENTION;  Surgeon: Gretta Lonni PARAS, MD;  [facility]: [facility];  Service: Cardiovascular;  Laterality: Right;  sfa stent    PERIPHERAL VASCULAR INTERVENTION  07/05/2022   Procedure: PERIPHERAL VASCULAR INTERVENTION;  Surgeon: Gretta Lonni PARAS, MD;  [facility];  Service: Cardiovascular;;   PERIPHERAL VASCULAR INTERVENTION  08/02/2022   Procedure: PERIPHERAL VASCULAR INTERVENTION;  Surgeon: Gretta Lonni PARAS, MD;  [facility]: [facility];  Service: Cardiovascular;;   PERIPHERAL VASCULAR INTERVENTION  08/06/2022   Procedure: PERIPHERAL VASCULAR INTERVENTION;  Surgeon: Gretta Lonni PARAS, MD;  Location: [facility] INVASIVE CV LAB;  Service: Cardiovascular;;   PERIPHERAL VASCULAR THROMBECTOMY  08/02/2022   Procedure: PERIPHERAL VASCULAR  THROMBECTOMY;  Surgeon: Gretta Lonni PARAS, MD;  Location: [facility] INVASIVE CV LAB;  Service: Cardiovascular;;   REVISION AMPUTATION, BELOW THE KNEE Left 10/23/2023   Procedure: REVISION AMPUTATION, BELOW THE KNEE;  Surgeon: Harden Jerona GAILS, MD;  Location: [facility] OR;  Service: Orthopedics;  Laterality: Left;  REVISION LEFT BELOW KNEE AMPUTATION   RIGHT/LEFT HEART CATH AND CORONARY ANGIOGRAPHY N/A 08/23/2022   Procedure: RIGHT/LEFT HEART CATH AND CORONARY ANGIOGRAPHY;  Surgeon: Dann Candyce RAMAN, MD;  Location: [facility] INVASIVE CV  LAB;  Service: Cardiovascular;  Laterality: N/A;   SKIN SPLIT GRAFT Right 03/18/2020   Procedure: SKIN GRAFTING RIGHT FOOT ULCER;  Surgeon: Harden Jerona GAILS, MD;  Location: [facility] OR;  Service: Orthopedics;  Laterality: Right;   STUMP REVISION Right 11/30/2022   Procedure: REVISION RIGHT TRANSMETATARSAL AMPUTATION;  Surgeon: Harden Jerona GAILS, MD;  Location: [facility] OR;  Service: Orthopedics;  Laterality: Right;   TEE WITHOUT CARDIOVERSION N/A 08/27/2022   Procedure: TRANSESOPHAGEAL ECHOCARDIOGRAM;  Surgeon: Hobart Powell BRAVO, MD;  Location: [facility] INVASIVE CV LAB;  Service: Cardiovascular;  Laterality: N/A;   TRANSESOPHAGEAL ECHOCARDIOGRAM (CATH LAB) N/A 10/24/2023   Procedure: TRANSESOPHAGEAL ECHOCARDIOGRAM;  Surgeon: Michele Richardson, DO;  [facility]: [facility];  Service: Cardiovascular;  Laterality: N/A;   WISDOM TOOTH EXTRACTION      Family History  Problem Relation Age of Onset   Diabetes Other    CAD Other     Social History:  Single. Lives alone and was independent PTA. She  reports that she quit smoking about 6 years ago. Her smoking use included cigarettes. She has never used smokeless tobacco. She reports that she does not currently use alcohol. She reports current drug use. Drug: Marijuana.   Allergies  Allergen Reactions   Trental  [Pentoxifylline ] Nausea And Vomiting   Vibramycin [Doxycycline] Nausea And Vomiting   Penicillins Rash    Tolerated cephalosporins 07/2022    Medications Prior to Admission  Medication Sig Dispense Refill   acetaminophen  (TYLENOL ) 500 MG tablet Take 1,000 mg by mouth 2 (two) times daily as needed for moderate pain (pain score 4-6), headache or fever.     aspirin  81 MG EC tablet Take 81 mg by mouth daily.     atorvastatin  (LIPITOR) 40 MG tablet Take 40 mg by mouth daily.     clopidogrel  (PLAVIX ) 75 MG tablet TAKE 1 TABLET BY MOUTH EVERY DAY WITH BREAKFAST 90 tablet 3   diphenhydrAMINE  HCl, Sleep, (ZZZQUIL) 25 MG CAPS Take 25 mg by mouth at bedtime as needed  (Sleep).     ferrous sulfate  325 (65 FE) MG EC tablet Take 1 tablet (325 mg total) by mouth daily with breakfast. 60 tablet 2   furosemide  (LASIX ) 40 MG tablet Take 1 tablet (40 mg total) by mouth 2 (two) times daily. 30 tablet 3   gabapentin  (NEURONTIN ) 300 MG capsule Take 300 mg by mouth at bedtime.     insulin  degludec (TRESIBA  FLEXTOUCH) 100 UNIT/ML FlexTouch Pen Inject 10 Units into the skin at bedtime. (Patient taking differently: Inject 16 Units into the skin at bedtime.) 3 mL 3   levothyroxine  (SYNTHROID ) 150 MCG tablet Take 150 mcg by mouth daily before breakfast.     metoprolol  succinate (TOPROL -XL) 25 MG 24 hr tablet Take 0.5 tablets (12.5 mg total) by mouth daily. 30 tablet 1   oxyCODONE  (ROXICODONE ) 5 MG immediate release tablet Take 1 tablet (5 mg total) by mouth every 6 (six) hours as needed for severe pain (pain score 7-10). 10 tablet 0  pantoprazole  (PROTONIX ) 40 MG tablet Take 40 mg by mouth daily.     promethazine  (PHENERGAN ) 25 MG tablet Take 1 tablet (25 mg total) by mouth every 6 (six) hours as needed for nausea or vomiting. 20 tablet 0   Alcohol Swabs  (PHARMACIST CHOICE ALCOHOL) PADS Apply topically.     nitroGLYCERIN  (NITROSTAT ) 0.4 MG SL tablet Place 1 tablet (0.4 mg total) under the tongue every 5 (five) minutes x 3 doses as needed for chest pain (if no relief after 3rd dose proceed to ED or call 911). 11/06/2022-New 25 tablet 1     Home: Home Living Family/patient expects to be discharged to:: Private residence Living Arrangements: Alone Available Help at Discharge: Family, Available 24 hours/day Type of Home: House Home Access: Ramped entrance Home Layout: One level Bathroom Shower/Tub: Walk-in shower (6 lip) Bathroom Toilet: Handicapped height Bathroom Accessibility: Yes Home Equipment: Wheelchair - manual, Agricultural consultant (2 wheels), Other (comment), Shower seat, BSC/3in1, Rollator (4 wheels) Additional Comments: mom lives across the street   Functional  History: Prior Function Prior Level of Function : Independent/Modified Independent Mobility Comments: works 5 days a week using cane, driving ADLs Comments: independent  Functional Status:  Mobility: Bed Mobility Overal bed mobility: Needs Assistance Bed Mobility: Sit to Supine, Supine to Sit Supine to sit: HOB elevated, Used rails, Modified independent (Device/Increase time) Sit to supine: Contact guard assist, HOB elevated, Used rails General bed mobility comments: pt able to pull herself into long sitting and then scoot herself to the EoB where se was able to don her Transfers Overall transfer level: Needs assistance Equipment used: Rolling walker (2 wheels), 2 person hand held assist Transfers: Sit to/from Stand, Bed to chair/wheelchair/BSC Sit to Stand: Mod assist, +2 physical assistance Bed to/from chair/wheelchair/BSC transfer type:: Stand pivot Stand pivot transfers: +2 physical assistance, Mod assist General transfer comment: mod Ax2 for power up to standing with pt holding onto therapists arms, once in standing, Pt able to remove her L hand from therapist and reach towards arm rest of BSC to pivot and sit. After finished on BSC pt able to pivot to recliner with modAx2      ADL: ADL Overall ADL's : Needs assistance/impaired Eating/Feeding: Set up, Sitting Grooming: Set up, Sitting Grooming Details (indicate cue type and reason): Sitting EOB Upper Body Bathing: Set up, Sitting Lower Body Bathing: Moderate assistance, Sitting/lateral leans Lower Body Bathing Details (indicate cue type and reason): Able to reach BLEs while seated EOB Upper Body Dressing : Set up, Sitting Lower Body Dressing: Moderate assistance, Sitting/lateral leans Lower Body Dressing Details (indicate cue type and reason): Able to reach BLEs while seated EOB, would need assist to pull above waist Toilet Transfer: Moderate assistance, +2 for physical assistance, Squat-pivot Toileting- Clothing  Manipulation and Hygiene: Contact guard assist Functional mobility during ADLs: Moderate assistance, +2 for physical assistance General ADL Comments: Completing ADLs EOB, good dynamic sitting balance, ability to wt shift for LB ADLs  Cognition: Cognition Orientation Level: Oriented X4 Cognition Arousal: Alert Behavior During Therapy: WFL for tasks assessed/performed   Blood pressure (!) 141/62, pulse 74, temperature 97.7 F (36.5 C), temperature source Oral, resp. rate 18, height 5' 4 (1.626 m), weight 104.8 kg, SpO2 96%. Physical Exam Vitals and nursing note reviewed. Exam conducted with a chaperone present.  Constitutional:      General: She is not in acute distress.    Appearance: Normal appearance. She is obese.     Comments: Pt sitting up in bed; mother at bedside;  awake, alert, appropriate, NAD  HENT:     Head: Normocephalic and atraumatic.     Right Ear: External ear normal.     Left Ear: External ear normal.     Nose: Nose normal. No congestion.     Mouth/Throat:     Mouth: Mucous membranes are moist.     Pharynx: Oropharynx is clear. No oropharyngeal exudate.   Eyes:     General:        Right eye: No discharge.        Left eye: No discharge.     Extraocular Movements: Extraocular movements intact.    Cardiovascular:     Rate and Rhythm: Normal rate and regular rhythm.     Heart sounds: Normal heart sounds. No murmur heard.    No gallop.  Pulmonary:     Effort: Pulmonary effort is normal. No respiratory distress.     Breath sounds: Normal breath sounds. No wheezing, rhonchi or rales.  Abdominal:     General: There is no distension.     Palpations: Abdomen is soft.     Tenderness: There is no abdominal tenderness.   Musculoskeletal:     Cervical back: Neck supple. No tenderness.     Comments: Ue's 5/5 B/L throughout RLE HF 4+/5; KE/KF 5-/5; and DF/PF at least 3/5- has old TMA amputation LLE- HF 4/5; KE/KF at least 4-/5- hard ot rest due to pain/VAC with L  BKA   Skin:    General: Skin is warm and dry.     Comments: Small scabbed area latera aspect of right transmetatarsal site (question drainage on sock) Left thigh with pressure area medially due to limb guard. Wound VAC in place on L-BKA- good seal. . Popped blister- large ~ 3 inches in diameter on medial posterior aspect of L thigh from LLE limb guard L posterior/medial above knee/on thigh- rubbed area from limb guard- abraded skin in shape of top of limb guard   Neurological:     Mental Status: She is alert and oriented to person, place, and time.     Comments: Decreased to light touch on RLE to mid calf- intact on LLE/L BKA  Psychiatric:        Mood and Affect: Mood normal.        Behavior: Behavior normal.     Results for orders placed or performed during the hospital encounter of 10/20/23 (from the past 48 hours)  Glucose, capillary     Status: Abnormal   Collection Time: 10/27/23 12:53 PM  Result Value Ref Range   Glucose-Capillary 377 (H) 70 - 99 mg/dL    Comment: Glucose reference range applies only to samples taken after fasting for at least 8 hours.  Glucose, capillary     Status: None   Collection Time: 10/27/23  5:34 PM  Result Value Ref Range   Glucose-Capillary 93 70 - 99 mg/dL    Comment: Glucose reference range applies only to samples taken after fasting for at least 8 hours.  Glucose, capillary     Status: Abnormal   Collection Time: 10/27/23  9:14 PM  Result Value Ref Range   Glucose-Capillary 151 (H) 70 - 99 mg/dL    Comment: Glucose reference range applies only to samples taken after fasting for at least 8 hours.   Comment 1 Notify RN    Comment 2 Document in Chart   Glucose, capillary     Status: Abnormal   Collection Time: 10/27/23 10:30 PM  Result Value Ref  Range   Glucose-Capillary 121 (H) 70 - 99 mg/dL    Comment: Glucose reference range applies only to samples taken after fasting for at least 8 hours.   Comment 1 Notify RN    Comment 2 Document in  Chart   CK     Status: None   Collection Time: 10/28/23  3:43 AM  Result Value Ref Range   Total CK 193 38 - 234 U/L    Comment: Performed at [facility], [address], [place], [place] [address]  Glucose, capillary     Status: Abnormal   Collection Time: 10/28/23  7:56 AM  Result Value Ref Range   Glucose-Capillary 123 (H) 70 - 99 mg/dL    Comment: Glucose reference range applies only to samples taken after fasting for at least 8 hours.  Glucose, capillary     Status: Abnormal   Collection Time: 10/28/23 11:48 AM  Result Value Ref Range   Glucose-Capillary 163 (H) 70 - 99 mg/dL    Comment: Glucose reference range applies only to samples taken after fasting for at least 8 hours.  Glucose, capillary     Status: Abnormal   Collection Time: 10/28/23  4:54 PM  Result Value Ref Range   Glucose-Capillary 125 (H) 70 - 99 mg/dL    Comment: Glucose reference range applies only to samples taken after fasting for at least 8 hours.  Hemoglobin A1c     Status: None   Collection Time: 10/28/23  7:40 PM  Result Value Ref Range   Hgb A1c MFr Bld 5.5 4.8 - 5.6 %    Comment: (NOTE) Diagnosis of Diabetes The following HbA1c ranges recommended by the American Diabetes Association (ADA) may be used as an aid in the diagnosis of diabetes mellitus.  Hemoglobin             Suggested A1C NGSP%              Diagnosis  <5.7                   Non Diabetic  5.7-6.4                Pre-Diabetic  >6.4                   Diabetic  <7.0                   Glycemic control for                       adults with diabetes.     Mean Plasma Glucose 111.15 mg/dL    Comment: Performed at [facility], [address], [place], [place] [address]  Glucose, capillary     Status: Abnormal   Collection Time: 10/28/23  8:30 PM  Result Value Ref Range   Glucose-Capillary 136 (H) 70 - 99 mg/dL    Comment: Glucose reference range applies only to samples taken after fasting for at least 8 hours.  Glucose,  capillary     Status: None   Collection Time: 10/29/23  7:40 AM  Result Value Ref Range   Glucose-Capillary 89 70 - 99 mg/dL    Comment: Glucose reference range applies only to samples taken after fasting for at least 8 hours.   No results found.    Blood pressure (!) 141/62, pulse 74, temperature 97.7 F (36.5 C), temperature source Oral, resp. rate 18, height 5' 4 (1.626  m), weight 104.8 kg, SpO2 96%.  Medical Problem List and Plan: 1. 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  Antithrombotics: -DVT/anticoagulation:  Pharmaceutical: Lovenox   -antiplatelet therapy: On plavix .  3. Pain Management: Oxycodone  prn. Will increase home dose gabapentin  to 300 mg TID- due to her younger age, should be able ot handle the dose- she reports phantom pain feels on fire. Took Gabapentin  at home prior for Restless leg syndrome.  4. Mood/Behavior/Sleep: LCSW to follow for evaluation and support.    -antipsychotic agents: n/a 5. Neuropsych/cognition: This patient is capable of making decisions on her own behalf. 6. Skin/Wound Care: Pressure relief measures. Protein supplements added. Monitor for recurrent drainage.  --foam dressing to area on left thigh.   7. Fluids/Electrolytes/Nutrition: Monitor I/O. Check CMET in am 8.  MRSA bacteremia: Last CK 522-->continue Daptomycin  with EOT  11/17/23  --weekly CRP, Sed rate, CPK in addition to CBC/BMP.  --follow up with Dr. Dea 07/17 @ 9:30 am prior to EOT.  9. T2DM on chronic insulin : Hgb A1C- 5.5 and well controlled (down from 6.4 earlier this year). Monitor BS ac/hs and use SSI for elevated BS.   --continue insulin  glargine 10 units and SSI was added today  --BS variable from 89- 377 in past 24 hours.  10. Severe PAD s/p stents: On Plavix  and atorvastatin .  Monitor chronically draining  area rea on right transmet site.   --educated on increasing protein, low salt as well as monitoring and protection of residual right foot.   11. 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 Monitor for signs of bleeding --Hgb 7.6 to 7.9 range.   12.  CAD/HFpEF w/EF  - EF 60-65%: Elevated troponin felt to be due to demand ischemia --Down to 501 on 06/23.   13.   Mod to severe Aortic stenosis/Moderate MR: Strict I/O w/daily weights. Monitor for signs of overload.  14.  Severe malnutrition: Albumin  level <1.5 w/ calcium  7.6-->corrects to 9.5. 15.  Abnormal LFTs: Recheck in am. A phos remains elevated at 176 and will recheck in am.  16. Told pt could use Purewick tonight, but tomorrow AM, will need to d/c; and start transferring to BSC/toilet.    I have personally performed a face to face diagnostic evaluation of this patient and formulated the key components of the plan.  Additionally, I have personally reviewed laboratory data, imaging studies, as well as relevant notes and concur with the physician assistant's documentation above.   The patient's status has not changed from the original H&P.  Any changes in documentation from the acute care chart have been noted above.     Sharlet GORMAN Schmitz, PA-C 10/29/2023

## 2023-10-29 NOTE — H&P (Signed)
 Physical Medicine and Rehabilitation Admission H&P     CC: Functional deficits due to L-BKA. Hx of right transmetatarsal amputation.       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 She was found to have lactic acidosis with elevated troponin, anemia requiring one unit PRBC and no clear source of infection found. She was transferred to [facility] for management and MRI LLE showed diffuse edema anterior and posterior compartment of left shin/calf extending from knee thorough ankle as well as deep intramuscular fascia as well as deep to medial gastroc muscle question cellulitis or fasciitis and blistering extending along left posteromedial ankle.  ID recommended  continuing broad spectrum antibiotics through surgery. Exam concerning for necrotizing fasciitis and she underwent L-BKA with open flab wrapped with wound VAC on 06/24 by Dr. Silver followed by revision BKA on 06/25 by Dr. Harden.      TEE done 06/26 revealing normal LVEF 60-65% which was negative for thrombus or endocarditis, negative for shunting, showed  moderate to severe aortic stenosis w/mild to moderate AR, mild to moderate MR and mild layered plaque involving descending aorta. Antibiotics narrowed to IV Daptomycin  thru 11/17/23 --4 weeks from negative [facility] 06/22.  Mentation has improved with improvement in pain but continues to have phantom pain also. PT/OT consulted and has been working with patient who requires mod assist X 2 for stand pivot transfers and +2 mod assist with ADLs. She lives alone in [place], was  independent and working PTA. CIR recommended due to functional decline.      Pain creeping back up- last pain meds at 9am and currently 2:30pm.  When at rest with meds, pain 3-4/10 and when doing therapy 11-12/10. Has a lot of phantom pain- feels like on fire. Used ot take gabapentin  300 mg at bedtime, but wa son 100 mg TID. For RLS at home.    LBM today first one since surgery yesterday AM  Peeing OK- been using Purewick- will to try and stop tomorrow.    Has a long term/chronic sore on R TMA that's draining slightly.    Review of Systems  Constitutional:  Positive for malaise/fatigue. Negative for chills and fever.  HENT:  Negative for hearing loss and tinnitus.   Eyes:  Negative for blurred vision and double vision.  Respiratory:  Negative for cough and shortness of breath.   Cardiovascular:  Positive for leg swelling. Negative for chest pain and palpitations.       BLE give out with increased activity  Gastrointestinal:  Negative for constipation, heartburn and nausea.       First BM since surgery was yesterday, but finally bowels moving.  Genitourinary:  Negative for dysuria and urgency.  Musculoskeletal:  Negative for myalgias and neck pain.  Neurological:  Positive for sensory change (feet numb. Has phatom pain LLE) and weakness. Negative for dizziness and headaches.  Psychiatric/Behavioral:  The patient is not nervous/anxious and does not have insomnia.   All other systems reviewed and are negative.  Past Medical History:  Diagnosis Date   Anemia     Aortic stenosis     Arthritis     CHF (congestive heart failure) (HCC)     Coronary artery disease     Diabetes mellitus without complication (HCC)      type 2   GERD (gastroesophageal reflux disease)     Heart murmur     History of blood transfusion     Hypercholesteremia     Hypertension     Hypothyroidism     Mitral regurgitation     Myocardial infarction (HCC)     Peripheral vascular disease (HCC)      PONV (postoperative nausea and vomiting)     Thyroid disease                 Past Surgical History:  Procedure Laterality Date   ABDOMINAL AORTOGRAM W/LOWER EXTREMITY Bilateral 01/14/2020    Procedure: ABDOMINAL AORTOGRAM W/LOWER EXTREMITY;  Surgeon: Gretta Lonni PARAS, MD;  [facility]: [facility];  Service: Cardiovascular;  Laterality: Bilateral;   ABDOMINAL AORTOGRAM W/LOWER EXTREMITY N/A 07/05/2022    Procedure: ABDOMINAL AORTOGRAM W/LOWER EXTREMITY;  Surgeon: Gretta Lonni PARAS, MD;  Location: [facility];  Service: Cardiovascular;  Laterality: N/A;   ABDOMINAL AORTOGRAM W/LOWER EXTREMITY N/A 08/02/2022    Procedure: ABDOMINAL AORTOGRAM W/LOWER EXTREMITY;  Surgeon: Gretta Lonni PARAS, MD;  Location: [facility];  Service: Cardiovascular;  Laterality: N/A;   ABDOMINAL AORTOGRAM W/LOWER EXTREMITY N/A 08/06/2022    Procedure: ABDOMINAL AORTOGRAM W/LOWER EXTREMITY;  Surgeon: Gretta Lonni PARAS, MD;  [facility]: [facility];  Service: Cardiovascular;  Laterality: N/A;   AMPUTATION Bilateral 07/20/2022    Procedure: AMPUTATION RIGHT GREAT TOE AND SECOND TOE, AMPUTATION LEFT GREAT TOE;  Surgeon: Harden Jerona GAILS, MD;  Location: [facility] OR;  Service: Orthopedics;  Laterality: Bilateral;   AMPUTATION Bilateral 08/08/2022    Procedure: BILATERAL TRANSMETATARSAL AMPUTATION;  Surgeon: Harden Jerona GAILS, MD;  Location: [facility] OR;  Service: Orthopedics;  Laterality: Bilateral;   AMPUTATION Left 10/22/2023    Procedure: AMPUTATION BELOW KNEE;  Surgeon: Lanis Fonda BRAVO, MD;  Location: [facility] OR;  Service: Vascular;  Laterality: Left;   AMPUTATION TOE       BIOPSY   04/06/2023    Procedure: BIOPSY;  Surgeon: Zahlia Elspeth SQUIBB, MD;  Location: [facility] ENDOSCOPY;  Service: Gastroenterology;;   COLONOSCOPY WITH PROPOFOL  N/A 04/06/2023    Procedure: COLONOSCOPY WITH PROPOFOL ;  Surgeon: Diandra Elspeth SQUIBB, MD;  Location: [facility] ENDOSCOPY;  Service: Gastroenterology;  Laterality: N/A;    ESOPHAGOGASTRODUODENOSCOPY (EGD) WITH PROPOFOL  N/A 04/06/2023    Procedure: ESOPHAGOGASTRODUODENOSCOPY (EGD) WITH PROPOFOL ;  Surgeon: Lauretta Elspeth SQUIBB, MD;  Location: [facility] ENDOSCOPY;  Service: Gastroenterology;  Laterality: N/A;   GIVENS CAPSULE STUDY N/A 04/06/2023    Procedure: GIVENS CAPSULE STUDY;  Surgeon: Carlissa Elspeth SQUIBB, MD;  Location: [facility] ENDOSCOPY;  Service: Gastroenterology;  Laterality: N/A;   PERIPHERAL VASCULAR ATHERECTOMY Right 01/14/2020    Procedure: PERIPHERAL VASCULAR ATHERECTOMY;  Surgeon: Gretta Lonni PARAS, MD;  Location: [facility] INVASIVE CV LAB;  Service: Cardiovascular;  Laterality: Right;  sfa   PERIPHERAL VASCULAR INTERVENTION Right 01/14/2020    Procedure: PERIPHERAL VASCULAR INTERVENTION;  Surgeon: Gretta Lonni PARAS, MD;  [facility];  Service: Cardiovascular;  Laterality: Right;  sfa stent    PERIPHERAL VASCULAR INTERVENTION   07/05/2022    Procedure: PERIPHERAL VASCULAR INTERVENTION;  Surgeon: Gretta Lonni PARAS, MD;  [facility];  Service: Cardiovascular;;  PERIPHERAL VASCULAR INTERVENTION   08/02/2022    Procedure: PERIPHERAL VASCULAR INTERVENTION;  Surgeon: Gretta Lonni PARAS, MD;  Location: [facility] INVASIVE CV LAB;  Service: Cardiovascular;;   PERIPHERAL VASCULAR INTERVENTION   08/06/2022    Procedure: PERIPHERAL VASCULAR INTERVENTION;  Surgeon: Gretta Lonni PARAS, MD;  Location: [facility] INVASIVE CV LAB;  Service: Cardiovascular;;   PERIPHERAL VASCULAR THROMBECTOMY   08/02/2022    Procedure: PERIPHERAL VASCULAR THROMBECTOMY;  Surgeon: Gretta Lonni PARAS, MD;  [facility];  Service: Cardiovascular;;   REVISION AMPUTATION, BELOW THE KNEE Left 10/23/2023    Procedure: REVISION AMPUTATION, BELOW THE KNEE;  Surgeon: Harden Jerona GAILS, MD;  Location: [facility] OR;  Service: Orthopedics;  Laterality: Left;  REVISION LEFT BELOW KNEE AMPUTATION   RIGHT/LEFT HEART CATH AND CORONARY ANGIOGRAPHY N/A 08/23/2022    Procedure: RIGHT/LEFT HEART CATH  AND CORONARY ANGIOGRAPHY;  Surgeon: Dann Candyce RAMAN, MD;  Location: [facility] INVASIVE CV LAB;  Service: Cardiovascular;  Laterality: N/A;   SKIN SPLIT GRAFT Right 03/18/2020    Procedure: SKIN GRAFTING RIGHT FOOT ULCER;  Surgeon: Harden Jerona GAILS, MD;  Location: [facility] OR;  Service: Orthopedics;  Laterality: Right;   STUMP REVISION Right 11/30/2022    Procedure: REVISION RIGHT TRANSMETATARSAL AMPUTATION;  Surgeon: Harden Jerona GAILS, MD;  Location: [facility] OR;  Service: Orthopedics;  Laterality: Right;   TEE WITHOUT CARDIOVERSION N/A 08/27/2022    Procedure: TRANSESOPHAGEAL ECHOCARDIOGRAM;  Surgeon: Hobart Powell BRAVO, MD;  Location: [facility] INVASIVE CV LAB;  Service: Cardiovascular;  Laterality: N/A;   TRANSESOPHAGEAL ECHOCARDIOGRAM (CATH LAB) N/A 10/24/2023    Procedure: TRANSESOPHAGEAL ECHOCARDIOGRAM;  Surgeon: Michele Richardson, DO;  [facility]: [facility];  Service: Cardiovascular;  Laterality: N/A;   WISDOM TOOTH EXTRACTION                   Family History  Problem Relation Age of Onset   Diabetes Other     CAD Other            Social History:  Single. Lives alone and was independent PTA. She  reports that she quit smoking about 6 years ago. Her smoking use included cigarettes. She has never used smokeless tobacco. She reports that she does not currently use alcohol. She reports current drug use. Drug: Marijuana.     Allergies       Allergies  Allergen Reactions   Trental  [Pentoxifylline ] Nausea And Vomiting   Vibramycin [Doxycycline] Nausea And Vomiting   Penicillins Rash      Tolerated cephalosporins 07/2022              Medications Prior to Admission  Medication Sig Dispense Refill   acetaminophen  (TYLENOL ) 500 MG tablet Take 1,000 mg by mouth 2 (two) times daily as needed for moderate pain (pain score 4-6), headache or fever.       aspirin  81 MG EC tablet Take 81 mg by mouth daily.       atorvastatin  (LIPITOR) 40 MG tablet Take 40 mg by mouth daily.       clopidogrel  (PLAVIX ) 75 MG  tablet TAKE 1 TABLET BY MOUTH EVERY DAY WITH BREAKFAST 90 tablet 3   diphenhydrAMINE  HCl, Sleep, (ZZZQUIL) 25 MG CAPS Take 25 mg by mouth at bedtime as needed (Sleep).       ferrous sulfate  325 (65 FE) MG EC tablet Take 1 tablet (325 mg total) by mouth daily with breakfast. 60 tablet 2   furosemide  (LASIX ) 40 MG tablet Take 1 tablet (40 mg total) by  mouth 2 (two) times daily. 30 tablet 3   gabapentin  (NEURONTIN ) 300 MG capsule Take 300 mg by mouth at bedtime.       insulin  degludec (TRESIBA  FLEXTOUCH) 100 UNIT/ML FlexTouch Pen Inject 10 Units into the skin at bedtime. (Patient taking differently: Inject 16 Units into the skin at bedtime.) 3 mL 3   levothyroxine  (SYNTHROID ) 150 MCG tablet Take 150 mcg by mouth daily before breakfast.       metoprolol  succinate (TOPROL -XL) 25 MG 24 hr tablet Take 0.5 tablets (12.5 mg total) by mouth daily. 30 tablet 1   oxyCODONE  (ROXICODONE ) 5 MG immediate release tablet Take 1 tablet (5 mg total) by mouth every 6 (six) hours as needed for severe pain (pain score 7-10). 10 tablet 0   pantoprazole  (PROTONIX ) 40 MG tablet Take 40 mg by mouth daily.       promethazine  (PHENERGAN ) 25 MG tablet Take 1 tablet (25 mg total) by mouth every 6 (six) hours as needed for nausea or vomiting. 20 tablet 0   Alcohol Swabs  (PHARMACIST CHOICE ALCOHOL) PADS Apply topically.       nitroGLYCERIN  (NITROSTAT ) 0.4 MG SL tablet Place 1 tablet (0.4 mg total) under the tongue every 5 (five) minutes x 3 doses as needed for chest pain (if no relief after 3rd dose proceed to ED or call 911). 11/06/2022-New 25 tablet 1            Home: Home Living Family/patient expects to be discharged to:: Private residence Living Arrangements: Alone Available Help at Discharge: Family, Available 24 hours/day Type of Home: House Home Access: Ramped entrance Home Layout: One level Bathroom Shower/Tub: Walk-in shower (6 lip) Bathroom Toilet: Handicapped height Bathroom Accessibility: Yes Home  Equipment: Wheelchair - manual, Agricultural consultant (2 wheels), Other (comment), Shower seat, BSC/3in1, Rollator (4 wheels) Additional Comments: mom lives across the street   Functional History: Prior Function Prior Level of Function : Independent/Modified Independent Mobility Comments: works 5 days a week using cane, driving ADLs Comments: independent   Functional Status:  Mobility: Bed Mobility Overal bed mobility: Needs Assistance Bed Mobility: Sit to Supine, Supine to Sit Supine to sit: HOB elevated, Used rails, Modified independent (Device/Increase time) Sit to supine: Contact guard assist, HOB elevated, Used rails General bed mobility comments: pt able to pull herself into long sitting and then scoot herself to the EoB where se was able to don her Transfers Overall transfer level: Needs assistance Equipment used: Rolling walker (2 wheels), 2 person hand held assist Transfers: Sit to/from Stand, Bed to chair/wheelchair/BSC Sit to Stand: Mod assist, +2 physical assistance Bed to/from chair/wheelchair/BSC transfer type:: Stand pivot Stand pivot transfers: +2 physical assistance, Mod assist General transfer comment: mod Ax2 for power up to standing with pt holding onto therapists arms, once in standing, Pt able to remove her L hand from therapist and reach towards arm rest of BSC to pivot and sit. After finished on BSC pt able to pivot to recliner with modAx2   ADL: ADL Overall ADL's : Needs assistance/impaired Eating/Feeding: Set up, Sitting Grooming: Set up, Sitting Grooming Details (indicate cue type and reason): Sitting EOB Upper Body Bathing: Set up, Sitting Lower Body Bathing: Moderate assistance, Sitting/lateral leans Lower Body Bathing Details (indicate cue type and reason): Able to reach BLEs while seated EOB Upper Body Dressing : Set up, Sitting Lower Body Dressing: Moderate assistance, Sitting/lateral leans Lower Body Dressing Details (indicate cue type and reason): Able  to reach BLEs while seated EOB, would need assist to  pull above waist Toilet Transfer: Moderate assistance, +2 for physical assistance, Squat-pivot Toileting- Clothing Manipulation and Hygiene: Contact guard assist Functional mobility during ADLs: Moderate assistance, +2 for physical assistance General ADL Comments: Completing ADLs EOB, good dynamic sitting balance, ability to wt shift for LB ADLs   Cognition: Cognition Orientation Level: Oriented X4 Cognition Arousal: Alert Behavior During Therapy: WFL for tasks assessed/performed     Blood pressure (!) 141/62, pulse 74, temperature 97.7 F (36.5 C), temperature source Oral, resp. rate 18, height 5' 4 (1.626 m), weight 104.8 kg, SpO2 96%. Physical Exam Vitals and nursing note reviewed. Exam conducted with a chaperone present.  Constitutional:      General: She is not in acute distress.    Appearance: Normal appearance. She is obese.     Comments: Pt sitting up in bed; mother at bedside; awake, alert, appropriate, NAD  HENT:     Head: Normocephalic and atraumatic.     Right Ear: External ear normal.     Left Ear: External ear normal.     Nose: Nose normal. No congestion.     Mouth/Throat:     Mouth: Mucous membranes are moist.     Pharynx: Oropharynx is clear. No oropharyngeal exudate.    Eyes:     General:        Right eye: No discharge.        Left eye: No discharge.     Extraocular Movements: Extraocular movements intact.      Cardiovascular:     Rate and Rhythm: Normal rate and regular rhythm.     Heart sounds: Normal heart sounds. No murmur heard.    No gallop.  Pulmonary:     Effort: Pulmonary effort is normal. No respiratory distress.     Breath sounds: Normal breath sounds. No wheezing, rhonchi or rales.  Abdominal:     General: There is no distension.     Palpations: Abdomen is soft.     Tenderness: There is no abdominal tenderness.    Musculoskeletal:     Cervical back: Neck supple. No tenderness.      Comments: Ue's 5/5 B/L throughout RLE HF 4+/5; KE/KF 5-/5; and DF/PF at least 3/5- has old TMA amputation LLE- HF 4/5; KE/KF at least 4-/5- hard ot rest due to pain/VAC with L BKA    Skin:    General: Skin is warm and dry.     Comments: Small scabbed area latera aspect of right transmetatarsal site (question drainage on sock) Left thigh with pressure area medially due to limb guard. Wound VAC in place on L-BKA- good seal. . Popped blister- large ~ 3 inches in diameter on medial posterior aspect of L thigh from LLE limb guard L posterior/medial above knee/on thigh- rubbed area from limb guard- abraded skin in shape of top of limb guard    Neurological:     Mental Status: She is alert and oriented to person, place, and time.     Comments: Decreased to light touch on RLE to mid calf- intact on LLE/L BKA  Psychiatric:        Mood and Affect: Mood normal.        Behavior: Behavior normal.        Lab Results Last 48 Hours        Results for orders placed or performed during the hospital encounter of 10/20/23 (from the past 48 hours)  Glucose, capillary     Status: Abnormal    Collection Time: 10/27/23 12:53 PM  Result Value Ref Range    Glucose-Capillary 377 (H) 70 - 99 mg/dL      Comment: Glucose reference range applies only to samples taken after fasting for at least 8 hours.  Glucose, capillary     Status: None    Collection Time: 10/27/23  5:34 PM  Result Value Ref Range    Glucose-Capillary 93 70 - 99 mg/dL      Comment: Glucose reference range applies only to samples taken after fasting for at least 8 hours.  Glucose, capillary     Status: Abnormal    Collection Time: 10/27/23  9:14 PM  Result Value Ref Range    Glucose-Capillary 151 (H) 70 - 99 mg/dL      Comment: Glucose reference range applies only to samples taken after fasting for at least 8 hours.    Comment 1 Notify RN      Comment 2 Document in Chart    Glucose, capillary     Status: Abnormal    Collection Time:  10/27/23 10:30 PM  Result Value Ref Range    Glucose-Capillary 121 (H) 70 - 99 mg/dL      Comment: Glucose reference range applies only to samples taken after fasting for at least 8 hours.    Comment 1 Notify RN      Comment 2 Document in Chart    CK     Status: None    Collection Time: 10/28/23  3:43 AM  Result Value Ref Range    Total CK 193 38 - 234 U/L      Comment: Performed at [facility], [address] [place], [place] [address]  Glucose, capillary     Status: Abnormal    Collection Time: 10/28/23  7:56 AM  Result Value Ref Range    Glucose-Capillary 123 (H) 70 - 99 mg/dL      Comment: Glucose reference range applies only to samples taken after fasting for at least 8 hours.  Glucose, capillary     Status: Abnormal    Collection Time: 10/28/23 11:48 AM  Result Value Ref Range    Glucose-Capillary 163 (H) 70 - 99 mg/dL      Comment: Glucose reference range applies only to samples taken after fasting for at least 8 hours.  Glucose, capillary     Status: Abnormal    Collection Time: 10/28/23  4:54 PM  Result Value Ref Range    Glucose-Capillary 125 (H) 70 - 99 mg/dL      Comment: Glucose reference range applies only to samples taken after fasting for at least 8 hours.  Hemoglobin A1c     Status: None    Collection Time: 10/28/23  7:40 PM  Result Value Ref Range    Hgb A1c MFr Bld 5.5 4.8 - 5.6 %      Comment: (NOTE) Diagnosis of Diabetes The following HbA1c ranges recommended by the American Diabetes Association (ADA) may be used as an aid in the diagnosis of diabetes mellitus.   Hemoglobin             Suggested A1C NGSP%              Diagnosis   <5.7                   Non Diabetic   5.7-6.4                Pre-Diabetic   >6.4  Diabetic   <7.0                   Glycemic control for                       adults with diabetes.        Mean Plasma Glucose 111.15 mg/dL      Comment: Performed at [facility], [address],  [place], [place] [address]  Glucose, capillary     Status: Abnormal    Collection Time: 10/28/23  8:30 PM  Result Value Ref Range    Glucose-Capillary 136 (H) 70 - 99 mg/dL      Comment: Glucose reference range applies only to samples taken after fasting for at least 8 hours.  Glucose, capillary     Status: None    Collection Time: 10/29/23  7:40 AM  Result Value Ref Range    Glucose-Capillary 89 70 - 99 mg/dL      Comment: Glucose reference range applies only to samples taken after fasting for at least 8 hours.      Imaging Results (Last 48 hours)  No results found.         Blood pressure (!) 141/62, pulse 74, temperature 97.7 F (36.5 C), temperature source Oral, resp. rate 18, height 5' 4 (1.626 m), weight 104.8 kg, SpO2 96%.   Medical Problem List and Plan: 1. 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  Antithrombotics: -DVT/anticoagulation:  Pharmaceutical: Lovenox              -antiplatelet therapy: On plavix .  3. Pain Management: Oxycodone  prn. Will increase home dose gabapentin  to 300 mg TID- due to her younger age, should be able ot handle the dose- she reports phantom pain feels on fire. Took Gabapentin  at home prior for Restless leg syndrome.  4. Mood/Behavior/Sleep: LCSW to follow for evaluation and support.               -antipsychotic agents: n/a 5. Neuropsych/cognition: This patient is capable of making decisions on her own behalf. 6. Skin/Wound Care: Pressure relief measures. Protein supplements added. Monitor for recurrent drainage.             --foam dressing to area on left thigh.   7. Fluids/Electrolytes/Nutrition: Monitor I/O. Check CMET in am 8.  MRSA bacteremia: Last CK 522-->continue Daptomycin  with EOT  11/17/23             --weekly CRP, Sed rate, CPK in addition to  CBC/BMP.             --follow up with Dr. Dea 07/17 @ 9:30 am prior to EOT.  9. T2DM on chronic insulin : Hgb A1C- 5.5 and well controlled (down from 6.4 earlier this year). Monitor BS ac/hs and use SSI for elevated BS.              --continue insulin  glargine 10 units and SSI was added today             --BS variable from 89- 377 in past 24 hours.  10. Severe PAD s/p stents: On Plavix  and atorvastatin .  Monitor chronically draining area rea on right transmet site.              --educated on increasing protein, low salt as well as monitoring and protection of residual right foot.   11. 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 Monitor for signs of bleeding --Hgb 7.6 to 7.9 range.   12.  CAD/HFpEF w/EF  - EF 60-65%: Elevated troponin felt to be due to demand ischemia --Down to 501 on 06/23.   13.   Mod to severe Aortic stenosis/Moderate MR: Strict I/O w/daily weights. Monitor for signs of overload.  14.  Severe malnutrition: Albumin  level <1.5 w/ calcium  7.6-->corrects to 9.5. 15.  Abnormal LFTs: Recheck in am. A phos remains elevated at 176 and will recheck in am.  16. Told pt could use Purewick tonight, but tomorrow AM, will need to d/c; and start transferring to BSC/toilet.      I have personally performed a face to face diagnostic evaluation of this patient and formulated the key components of the plan.  Additionally, I have personally reviewed laboratory data, imaging studies, as well as relevant notes and concur with the physician assistant's documentation above.   The patient's status has not changed from the original H&P.  Any changes in documentation from the acute care chart have been noted above.       Sharlet GORMAN Schmitz, PA-C 10/29/2023

## 2023-10-29 NOTE — Discharge Instructions (Signed)

## 2023-10-29 NOTE — Progress Notes (Addendum)
  Inpatient Rehabilitation Admissions Coordinator   [facility] was initiated 6/30 with [employer]. I await their determination.  Heron Leavell, RN, MSN Rehab Admissions Coordinator [phone] 10/29/2023 8:53 AM   I have insurance approval and CIR bed to admit her to today. I met with patient and her Mom at bedside and they are in agreement. Acute team and TOC made aware. I will make the arrangements.  Heron Leavell, RN, MSN Rehab Admissions Coordinator [phone] 10/29/2023 11:03 AM

## 2023-10-29 NOTE — Progress Notes (Signed)
 PROGRESS NOTE   Kayla Schmitt  FMW:[id number]    DOB: Feb 18, 1966    DOA: 10/20/2023  PCP: [name], NP   I have briefly reviewed patients previous medical records in [facility].   Brief Hospital Course:  58 y.o. 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  Sepsis protocol was initiated and she was started on vancomycin  and cefepime .  Blood cultures grew gram-positive cocci but no clear source of infection.  CT of chest abdomen and pelvis without clear abnormality.  Venous Doppler negative for DVT.  She was noted to have purple spotty area of discoloration of left leg.  She was then transferred to [facility] for vascular surgery consult. At [facility] patient has been treated for sepsis with endorgan dysfunction including elevated lactic acid and AKI, peripheral vascular disease with increased pain of the left lower extremity, AKI, altered mental status, DM type II and elevated troponin secondary to demand ischemia. Patient also received 1 unit of blood transfusion as hemoglobin dropped 8.2-7. No GI bleed at outside hospital.   Stable for DC to CIR since 6/28.  Since then awaiting formal rehab consult, rehab bed and insurance authorization-was started on 6/30.  Delays noted.   Assessment & Plan:   Necrotizing fasciitis of left lower extremity/PAD  Vascular surgery and orthopedics were consulted She has history of bilateral transmetatarsal amputations and previous bilateral lower extremity endovascular revascularization. S/p left BKA with wound VAC placement on 6/24 by Dr. Fonda Rim S/p left revision BKA, on 6/25 by Dr. Harden.  Orthopedic follow-up  appreciated.  Dr. Harden to advise duration of wound VAC. Outpatient follow-up with Dr. Harden.  MRSA bacteremia Surgical intervention on left lower extremity as noted above. ID managing antimicrobial treatment Vancomycin  switched to daptomycin .  CK being followed while on daptomycin , 193/normal on 6/30. TEE 6/26: LVEF 60 to 65%. No evidence of vegetation/infective endocarditis on this transesophageael echocardiogram.  No evidence of intra-atrial shunt. Surveillance blood cultures x 2 from 6/22, negative, final. Surgical tissue 6/25: No growth. As per ID sign off on 6/27: Continue current dose of daptomycin  through 7/20, therapeutic drug monitoring of CK while on daptomycin .They will arrange outpatient follow-up in ID clinic. PICC line placed 6/28.  Acute kidney injury Suspecting prerenal in the setting of sepsis and hypotension Resolved.  Avoid nephrotoxics and hypotension.  Elevated troponin secondary to demand ischemia/history of CAD Outside facility troponin, 696 > 576 > 532.  No anginal symptoms or acute EKG changes. TTE: LVEF 50-55%, grade 2 diastolic dysfunction. TEE results as above. Was on IV heparin  drip for PAD indication which was discontinued on 6/24. Continue atorvastatin  and Plavix .  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 Patient received a unit of PRBC at outside hospital. Follow CBCs daily and transfuse for hemoglobin 7 g or less Hemoglobin 7.6 >7.6 >7.9, stable. Periodically monitor CBCs.  Insulin -dependent type 2 DM Good inpatient control on current dose of Semglee  10 units at bedtime and NovoLog  SSI Periodic fluctuations but controlled in the most part.  Chronic combined systolic and diastolic CHF EF appears to have improved compared to prior.  Clinically euvolemic.  Hypertension Controlled. Monitoring off of antihypertensives  Prolonged QT Minimize QT prolonging  drugs Last EKG on 6/22 showed QTc of 486 ms.  Hypothyroidism Continue with levothyroxine .  Constipation Continue aggressive bowel regimen. Had a large BM on 6/30.  Moderate to severe aortic stenosis Noted on TEE 6/26 Outpatient follow-up with cardiology.  Body mass index is 39.66 kg/m./Obesity Complicates care.  Outpatient follow-up.   DVT prophylaxis: SCD's Start: 10/23/23 1248     Code Status: Full Code:  Family Communication: None at bedside today. Disposition:  Stable for DC to CIR pending insurance approval.   Consultants:   Vascular surgery Orthopedics Infectious disease Cardiology  Procedures:   As above  Subjective:  Denies complaints.  Has used occasional doses of IV Dilaudid  in addition to oral opioids, around her therapies.  Advised that she take oral opioids 30 minutes before therapies.  Had a large BM yesterday.  No other complaints.  Eager to move on with rehab.  Objective:   Vitals:   10/28/23 0427 10/28/23 0837 10/28/23 2031 10/29/23 0742  BP: (!) 136/55 132/60 (!) 138/49 (!) 141/62  Pulse: 78 80 72 74  Resp: 18 18 18 18   Temp: 98.8 F (37.1 C) 98 F (36.7 C) 97.7 F (36.5 C)   TempSrc:   Oral   SpO2: 98% 99% 99% 96%  Weight:      Height:        General exam: Young female, moderately built and nourished lying comfortably propped up in bed without distress.  Appears to be in good spirits. Respiratory system: Clear to auscultation.  No increased work of breathing. Cardiovascular system: S1 & S2 heard, RRR. No JVD, murmurs, rubs, gallops or clicks. No right lower extremity pedal edema.  Off of telemetry. Gastrointestinal system: Abdomen is nondistended, soft and nontender. No organomegaly or masses felt. Normal bowel sounds heard. Central nervous system: Alert and oriented. No focal neurological deficits. Extremities: Symmetric 5 x 5 power.  Healed right transmetatarsal amputation stump.  Has heel protector on right lower extremity.  Left BKA  stump site with wound VAC and postop dressing clean and dry-stable without acute findings.  Also has a splint on her left lower extremity since postop.  0 drain output documented in the last 24 hours. Skin: No rashes, lesions or ulcers Psychiatry: Judgement and insight appear normal. Mood & affect appropriate.     Data Reviewed:   I have personally reviewed following labs and imaging studies   CBC: Recent Labs  Lab 10/24/23 0438 10/25/23 0526 10/26/23 0441  WBC 10.1 10.1 9.0  HGB 7.6* 7.6* 7.9*  HCT 24.8* 24.8* 25.2*  MCV 92.9 92.5 92.6  PLT 268 287 287    Basic Metabolic Panel: Recent Labs  Lab 10/23/23 0435 10/24/23 0438 10/25/23 0526  NA 135 138 139  K 4.1 3.5 3.9  CL 109 110 108  CO2 22 21* 23  GLUCOSE 187* 115* 102*  BUN 19 19 13   CREATININE 1.07* 1.15* 0.99  CALCIUM  7.3* 7.6* 7.8*  MG  --   --  2.2    Liver Function Tests: Recent Labs  Lab 10/23/23 0435 10/24/23 0438  AST 35 50*  ALT 28 26  ALKPHOS 171* 176*  BILITOT 0.7 0.4  PROT 5.2* 5.3*  ALBUMIN  <1.5* <1.5*    CBG: Recent Labs  Lab 10/28/23 1654 10/28/23 2030 10/29/23 0740  GLUCAP 125* 136* 89    Microbiology Studies:   Recent Results (from the past 240 hours)  Culture, blood (Routine X 2) w Reflex to ID Panel  Status: None   Collection Time: 10/20/23 10:00 PM   Specimen: BLOOD LEFT HAND  Result Value Ref Range Status   Specimen Description BLOOD LEFT HAND  Final   Special Requests   Final    BOTTLES DRAWN AEROBIC AND ANAEROBIC Blood Culture adequate volume   Culture   Final    NO GROWTH 5 DAYS Performed at [facility], [address] [place], [place] [address]    Report Status 10/25/2023 FINAL  Final  Culture, blood (Routine X 2) w Reflex to ID Panel     Status: None   Collection Time: 10/20/23 10:02 PM   Specimen: BLOOD LEFT ARM  Result Value Ref Range Status   Specimen Description BLOOD LEFT ARM  Final   Special Requests   Final    BOTTLES DRAWN AEROBIC AND  ANAEROBIC Blood Culture adequate volume   Culture   Final    NO GROWTH 5 DAYS Performed at [facility], [address], [place], [place] [address]    Report Status 10/25/2023 FINAL  Final  Surgical pcr screen     Status: None   Collection Time: 10/21/23  9:19 PM   Specimen: Nasal Mucosa; Nasal Swab  Result Value Ref Range Status   MRSA, PCR NEGATIVE NEGATIVE Final   Staphylococcus aureus NEGATIVE NEGATIVE Final    Comment: (NOTE) The Xpert SA Assay (FDA approved for NASAL specimens in patients 32 years of age and older), is one component of a comprehensive surveillance program. It is not intended to diagnose infection nor to guide or monitor treatment. Performed at [facility], [address], [place], [place] [address]   Aerobic/Anaerobic Culture w Gram Stain (surgical/deep wound)     Status: None   Collection Time: 10/23/23 11:39 AM   Specimen: PATH Soft tissue  Result Value Ref Range Status   Specimen Description TISSUE  Final   Special Requests A  Final   Gram Stain NO WBC SEEN NO ORGANISMS SEEN   Final   Culture   Final    No growth aerobically or anaerobically. Performed at [facility], [address] [place], [place] [address]    Report Status 10/28/2023 FINAL  Final    Radiology Studies:  No results found.   Scheduled Meds:    atorvastatin   40 mg Oral Daily   Chlorhexidine  Gluconate Cloth  6 each Topical Daily   clopidogrel   75 mg Oral Daily   enoxaparin  (LOVENOX ) injection  50 mg Subcutaneous Daily   ferrous sulfate   325 mg Oral Q breakfast   gabapentin   100 mg Oral TID   insulin  aspart  0-5 Units Subcutaneous QHS   insulin  aspart  0-9 Units Subcutaneous TID WC   insulin  glargine-yfgn  10 Units Subcutaneous QHS   levothyroxine   150 mcg Oral QAC breakfast   nystatin   Topical BID   pantoprazole   40 mg Oral BID   polyethylene glycol  17 g Oral BID   senna-docusate  1 tablet Oral BID   sodium chloride  flush  10-40 mL Intracatheter  Q12H    Continuous Infusions:    DAPTOmycin  700 mg (10/28/23 1606)   magnesium  sulfate bolus IVPB       LOS: 9 days     Trenda Mar, MD,  FACP, [facility], [facility], [facility]   [facility] [place]      To contact the attending provider between 7A-7P or the covering provider during after hours 7P-7A, please  log into the web site [email or web address] and access using universal [facility] password for that web site. If you do not have the password, please call the hospital operator.  10/29/2023, 8:25 AM

## 2023-10-29 NOTE — Progress Notes (Signed)
 Physical Therapy Treatment Patient Details Name: Kayla Schmitt MRN: [id number] DOB: 12/01/1965 Today's Date: 10/29/2023   History of Present Illness Pt is a 58 y.o female admitted 6/22 for lethargy & LLE pain. Sepsis protocol initiated. Negative for osteomyelitis & DVT. 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    PT Comments  Pt continues to progress with adaptation to change in COG. Pt able to come to fully standing with pivots today. Pt requesting to get up to [facility]. Pt mod I for coming to EoB. Residual limb protector noted to pinch inner thigh with redness in area, so removed for therapy today. Pt able to come to fully standing and perform better pivot from bed to [facility] and BSC to chair. Pt is looking forward to discharge to [facility] this afternoon.     If plan is discharge home, recommend the following: A lot of help with walking and/or transfers;A lot of help with bathing/dressing/bathroom;Assistance with cooking/housework;Assist for transportation;Help with stairs or ramp for entrance     Equipment Recommendations  None recommended by PT    Recommendations for Other Services Rehab consult     Precautions / Restrictions Precautions Precautions: Fall Recall of Precautions/Restrictions: Intact Precaution/Restrictions Comments: L BKA residual limb protector and wound vax Restrictions Weight Bearing Restrictions Per Provider Order: Yes LLE Weight Bearing Per Provider Order: Non weight bearing     Mobility  Bed Mobility Overal bed mobility: Needs Assistance Bed Mobility: Sit to Supine, Supine to Sit     Supine to sit: HOB elevated, Used rails, Modified independent (Device/Increase time)     General bed mobility comments: pt able to pull herself into long sitting and then scoot herself to the EoB    Transfers Overall  transfer level: Needs assistance Equipment used: Rolling walker (2 wheels), 2 person hand held assist Transfers: Sit to/from Stand, Bed to chair/wheelchair/BSC Sit to Stand: +2 physical assistance, Min assist Stand pivot transfers: +2 physical assistance, Min assist         General transfer comment: improved stability with sit to stand and pivot transfers from bed to [facility] and BSC to recliner       Balance Overall balance assessment: Needs assistance Sitting-balance support: No upper extremity supported, Feet supported Sitting balance-Leahy Scale: Good     Standing balance support: During functional activity, Reliant on assistive device for balance Standing balance-Leahy Scale: Zero                              Communication Communication Communication: No apparent difficulties  Cognition Arousal: Alert Behavior During Therapy: WFL for tasks assessed/performed                             Following commands: Intact      Cueing Cueing Techniques: Verbal cues     General Comments General comments (skin integrity, edema, etc.): Pt with some pinching from residual limb protector, removed for therapy today and messaged MD and RN      Pertinent Vitals/Pain Pain Assessment Faces Pain Scale: Hurts even more Breathing: normal Negative Vocalization: none Facial Expression: smiling or inexpressive Body Language: relaxed Consolability: no need to console PAINAD Score: 0 Pain Location: LLE Pain Descriptors / Indicators: Discomfort, Grimacing, Guarding, Operative site guarding Pain Intervention(s): Limited  activity within patient's tolerance, Monitored during session, Repositioned     PT Goals (current goals can now be found in the care plan section) Acute Rehab PT Goals PT Goal Formulation: With patient/family Time For Goal Achievement: 11/07/23 Potential to Achieve Goals: Good Progress towards PT goals: Progressing toward goals    Frequency     Min 3X/week           Co-evaluation PT/OT/SLP Co-Evaluation/Treatment: Yes Reason for Co-Treatment: For patient/therapist safety;To address functional/ADL transfers PT goals addressed during session: Mobility/safety with mobility OT goals addressed during session: ADL's and self-care      AM-PAC PT 6 Clicks Mobility   Outcome Measure  Help needed turning from your back to your side while in a flat bed without using bedrails?: None Help needed moving from lying on your back to sitting on the side of a flat bed without using bedrails?: A Little Help needed moving to and from a bed to a chair (including a wheelchair)?: A Little Help needed standing up from a chair using your arms (e.g., wheelchair or bedside chair)?: Total Help needed to walk in hospital room?: Total Help needed climbing 3-5 steps with a railing? : Total 6 Click Score: 13    End of Session Equipment Utilized During Treatment: Gait belt Activity Tolerance: Patient tolerated treatment well Patient left: with call bell/phone within reach;in chair Nurse Communication: Mobility status PT Visit Diagnosis: Unsteadiness on feet (R26.81);Other abnormalities of gait and mobility (R26.89);Muscle weakness (generalized) (M62.81);Difficulty in walking, not elsewhere classified (R26.2)     Time: 1000-1040 PT Time Calculation (min) (ACUTE ONLY): 40 min  Charges:    $Therapeutic Activity: 38-52 mins PT General Charges $$ ACUTE PT VISIT: 1 Visit                     [place] B. Fleeta Lapidus PT, DPT Acute Rehabilitation Services Please use secure chat or  Call Office [phone]    Almarie KATHEE Fleeta [facility] 10/29/2023, 12:54 PM

## 2023-10-29 NOTE — Progress Notes (Addendum)
 Cornelio Bouchard, MD  Physician Physical Medicine and Rehabilitation   PMR Pre-admission    Signed   Date of Service: 10/25/2023  5:09 PM  Related encounter: Admission (Current) from 10/20/2023 in [facility]   Signed     Expand All Collapse All  Show:Clear all [x] Written[x] Templated[] Copied  Added by: [x] Alison Heron MATSU, RN[x] Yvone Delayne Tinnie SHAUNNA, CCC-SLP[x] Cornelio Bouchard, MD  [] Hover for details PMR Admission Coordinator Pre-Admission Assessment   Patient: Kayla Schmitt is an 58 y.o., female MRN: [id number] DOB: August 09, 1965 Height: 5' 4 (162.6 cm) Weight: 104.8 kg   Insurance Information HMO: yes    PPO:      PCP:      IPA:      80/20:      OTHER:  PRIMARY: [employer] Managed      Policy#: [id number]      Subscriber: patient CM Name: Corean Loose      Phone#: [phone] ext [phone]     Fax#: [phone]  Pre-Cert#: [id number]  aaproved 7/1 until 11/04/23 f/u with Rudolph Kanaris phone [phone] ext [phone] fax [phone]     Employer:  Benefits:  Phone #: [phone]     Name:  Eff. Date: 01/28/22-04/29/24     Deduct: $3,500 ($2,047.62 met)      Out of Pocket Max: $7,000 ($2,296.29 met)      Life Max: NA CIR: $250 co-pay/admission, then 70% coverage, 30% co-insurance      SNF: 70% coverage, 30% co-insurance Outpatient: 70% coverage     Co-Pay: 30% co-insurance Home Health: 70% coverage      Co-Pay: 30% co-insurance DME: 70%  coverage     Co-Pay: 30% co-insruance Providers: in-network SECONDARY:       Policy#:      Phone#:    Artist:       Phone#:    The Data processing manager" for patients in Inpatient Rehabilitation Facilities with attached "Privacy Act Statement-Health Care Records" was provided and verbally reviewed with: Patient   Emergency Contact Information Contact Information       Name Relation Home Work Mobile    Harms,[name] Mother [phone]   [phone]         Other Contacts    None on File      Current Medical History  Patient Admitting Diagnosis: Left BKA   History of Present Illness: Pt is a 58 year old female with medical hx significant for: multivessel CAD, aortic stenosis, essential HTN, heart failure with reduced ejection fraction less than 40-45%, h/o PAD s/p bilateral stent placement and s/p bilateral metatarsal amputation, chronic anemia, hyperlipidemia, hypothyroidism, h/o GI bleed secondary to AVM. Pt initially presented to [facility] for lethargy. Pt also found to be febrile and hypotensive. Sepsis protocol initiated. Blood cultures growing gram-positive cocci. Pt also c/o left leg pain. CT chest/abdomen/pelvis and MRI stump negative for abnormalities. Doppler ultrasound negative for DVT. Noted purple spotty area of discoloration in left leg. ABI of LLE showed loss of tibial waveform. Pt received 1 unit blood transfusion d/t Hgb of 7. Pt transferred to [facility] on 10/20/23.    Pt received 1 unit of blood on 6/22. Orthopedics consulted. Determined pt at risk for necrotizing fasciitis. Vascular Surgery consulted. Recommended amputation. 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 Lanis on 10/22/23; left open for revision.  Pt underwent revision of left BKA by Dr. Harden on 10/23/23. Pt underwent TEE by Dr. Michele on 10/24/23. Showed EF  60-65%. Therapy evaluations completed and CIR recommended d/t functional mobility deficits.   Patient's medical record from [facility] has been reviewed by the rehabilitation admission coordinator and physician.   Past Medical History      Past Medical History:  Diagnosis Date   Anemia     Aortic stenosis     Arthritis     CHF (congestive heart failure) (HCC)     Coronary artery disease     Diabetes mellitus without complication (HCC)      type 2   GERD (gastroesophageal reflux disease)     Heart murmur     History of blood transfusion     Hypercholesteremia      Hypertension     Hypothyroidism     Mitral regurgitation     Myocardial infarction (HCC)     Peripheral vascular disease (HCC)     PONV (postoperative nausea and vomiting)     Thyroid disease          Has the patient had major surgery during 100 days prior to admission? Yes   Family History   family history includes CAD in an other family member; Diabetes in an other family member.   Current Medications  Current Medications    Current Facility-Administered Medications:    acetaminophen  (TYLENOL ) tablet 650 mg, 650 mg, Oral, Q6H PRN, 650 mg at 10/21/23 1531 **OR** acetaminophen  (TYLENOL ) suppository 650 mg, 650 mg, Rectal, Q6H PRN, Gerome, Emma M, PA-C   alum & mag hydroxide-simeth (MAALOX/MYLANTA) 200-200-20 MG/5ML suspension 15-30 mL, 15-30 mL, Oral, Q2H PRN, Gerome, Emma M, PA-C   atorvastatin  (LIPITOR) tablet 40 mg, 40 mg, Oral, Daily, Gerome Herring M, PA-C, 40 mg at 10/29/23 9146   bisacodyl  (DULCOLAX) suppository 10 mg, 10 mg, Rectal, Daily PRN, Hongalgi, Anand D, MD   Chlorhexidine  Gluconate Cloth 2 % PADS 6 each, 6 each, Topical, Daily, Hongalgi, Anand D, MD, 6 each at 10/29/23 0900   clopidogrel  (PLAVIX ) tablet 75 mg, 75 mg, Oral, Daily, Gerome Herring M, PA-C, 75 mg at 10/29/23 9147   DAPTOmycin  (CUBICIN ) IVPB 700 mg/100mL premix, 700 mg, Intravenous, Q1400, Judeth Trenda BIRCH, MD, Last Rate: 200 mL/hr at 10/28/23 1606, 700 mg at 10/28/23 1606   diphenhydrAMINE  (BENADRYL ) injection 50 mg, 50 mg, Intravenous, Q8H PRN, Gerome, Emma M, PA-C   enoxaparin  (LOVENOX ) injection 50 mg, 50 mg, Subcutaneous, Daily, Duda, Marcus V, MD, 50 mg at 10/29/23 9146   ferrous sulfate  tablet 325 mg, 325 mg, Oral, Q breakfast, Gerome Herring M, PA-C, 325 mg at 10/29/23 9147   gabapentin  (NEURONTIN ) capsule 100 mg, 100 mg, Oral, TID, Gerome Herring M, PA-C, 100 mg at 10/29/23 9147   guaiFENesin -dextromethorphan (ROBITUSSIN DM) 100-10 MG/5ML syrup 15 mL, 15 mL, Oral, Q4H PRN, Gerome, Emma M, PA-C    hydrALAZINE  (APRESOLINE ) injection 5 mg, 5 mg, Intravenous, Q20 Min PRN, Gerome, Emma M, PA-C   HYDROmorphone  (DILAUDID ) injection 0.5 mg, 0.5 mg, Intravenous, Q3H PRN, Gerome Herring M, PA-C, 0.5 mg at 10/28/23 9070   insulin  aspart (novoLOG ) injection 0-5 Units, 0-5 Units, Subcutaneous, QHS, Hongalgi, Anand D, MD   insulin  aspart (novoLOG ) injection 0-9 Units, 0-9 Units, Subcutaneous, TID WC, Hongalgi, Anand D, MD   insulin  glargine-yfgn (SEMGLEE ) injection 10 Units, 10 Units, Subcutaneous, QHS, Collins, Emma M, PA-C, 10 Units at 10/28/23 2257   labetalol  (NORMODYNE ) injection 10 mg, 10 mg, Intravenous, Q10 min PRN, Gerome, Emma M, PA-C   levothyroxine  (SYNTHROID ) tablet 150 mcg, 150 mcg, Oral, QAC breakfast, Collins,  Maurilio HERO, PA-C, 150 mcg at 10/29/23 0630   magnesium  sulfate IVPB 2 g 50 mL, 2 g, Intravenous, Daily PRN, Gerome, Emma M, PA-C   metoprolol  tartrate (LOPRESSOR ) injection 2-5 mg, 2-5 mg, Intravenous, Q2H PRN, Gerome, Emma M, PA-C   nystatin (MYCOSTATIN/NYSTOP) topical powder, , Topical, BID, Hongalgi, Anand D, MD, Given at 10/29/23 9140   ondansetron  (ZOFRAN ) injection 4 mg, 4 mg, Intravenous, Q6H PRN, Gerome, Emma M, PA-C   oxyCODONE -acetaminophen  (PERCOCET/ROXICET) 5-325 MG per tablet 1-2 tablet, 1-2 tablet, Oral, Q4H PRN, Gerome Maurilio HERO, PA-C, 2 tablet at 10/29/23 9089   pantoprazole  (PROTONIX ) EC tablet 40 mg, 40 mg, Oral, BID, Gerome Maurilio M, PA-C, 40 mg at 10/29/23 0853   phenol (CHLORASEPTIC) mouth spray 1 spray, 1 spray, Mouth/Throat, PRN, Gerome, Emma M, PA-C   polyethylene glycol (MIRALAX  / GLYCOLAX ) packet 17 g, 17 g, Oral, BID, Hongalgi, Anand D, MD, 17 g at 10/29/23 9146   potassium chloride  SA (KLOR-CON  M) CR tablet 20-40 mEq, 20-40 mEq, Oral, Daily PRN, Gerome, Emma M, PA-C   senna-docusate (Senokot-S) tablet 1 tablet, 1 tablet, Oral, QHS PRN, Gerome, Emma M, PA-C   senna-docusate (Senokot-S) tablet 1 tablet, 1 tablet, Oral, BID, Hongalgi, Anand D, MD, 1 tablet  at 10/29/23 9146   sodium chloride  flush (NS) 0.9 % injection 10-40 mL, 10-40 mL, Intracatheter, Q12H, Hongalgi, Anand D, MD, 10 mL at 10/29/23 0856   trimethobenzamide  (TIGAN ) injection 200 mg, 200 mg, Intramuscular, Q8H PRN, Gerome Maurilio HERO, PA-C     Patients Current Diet:  Diet Order                  Diet heart healthy/carb modified Room service appropriate? Yes; Fluid consistency: Thin  Diet effective now                       Precautions / Restrictions Precautions Precautions: Fall Precaution/Restrictions Comments: L BKA residual limb protector and wound vax Restrictions Weight Bearing Restrictions Per Provider Order: Yes LLE Weight Bearing Per Provider Order: Non weight bearing    Has the patient had 2 or more falls or a fall with injury in the past year? No   Prior Activity Level Community (5-7x/wk): gets out of house daily; drives, works   Prior Functional Level Self Care: Did the patient need help bathing, dressing, using the toilet or eating? Independent   Indoor Mobility: Did the patient need assistance with walking from room to room (with or without device)? Unknown (was using wheelchair recently)   Stairs: Did the patient need assistance with internal or external stairs (with or without device)? Unknown (pt avoided stairs)   Functional Cognition: Did the patient need help planning regular tasks such as shopping or remembering to take medications? Independent   Patient Information Are you of Hispanic, Latino/a,or Spanish origin?: A. No, not of Hispanic, Latino/a, or Spanish origin What is your race?: A. White Do you need or want an interpreter to communicate with a doctor or health care staff?: 0. No   Patient's Response To:  Health Literacy and Transportation Is the patient able to respond to health literacy and transportation needs?: Yes Health Literacy - How often do you need to have someone help you when you read instructions, pamphlets, or other written  material from your doctor or pharmacy?: Never In the past 12 months, has lack of transportation kept you from medical appointments or from getting medications?: No In the past 12 months, has lack of transportation kept  you from meetings, work, or from getting things needed for daily living?: No   Journalist, newspaper / Equipment Home Equipment: Wheelchair - manual, Agricultural consultant (2 wheels), Other (comment), Shower seat, BSC/3in1, Rollator (4 wheels)   Prior Device Use: Indicate devices/aids used by the patient prior to current illness, exacerbation or injury? Manual wheelchair   Current Functional Level Cognition   Orientation Level: Oriented X4    Extremity Assessment (includes Sensation/Coordination)   Upper Extremity Assessment: Generalized weakness  Lower Extremity Assessment: Defer to PT evaluation RLE Deficits / Details: R transmet amputation, hip, knee and ankle strength and ROM WFL LLE Deficits / Details: s/p 6/25 L BKA and 6/26 L BKA revision hip ROM and strength WFL LLE: Unable to fully assess due to pain     ADLs   Overall ADL's : Needs assistance/impaired Eating/Feeding: Set up, Sitting Grooming: Set up, Sitting Grooming Details (indicate cue type and reason): Sitting EOB Upper Body Bathing: Set up, Sitting Lower Body Bathing: Moderate assistance, Sitting/lateral leans Lower Body Bathing Details (indicate cue type and reason): Able to reach BLEs while seated EOB Upper Body Dressing : Set up, Sitting Lower Body Dressing: Moderate assistance, Sitting/lateral leans Lower Body Dressing Details (indicate cue type and reason): Able to reach BLEs while seated EOB, would need assist to pull above waist Toilet Transfer: Moderate assistance, +2 for physical assistance, Squat-pivot Toileting- Clothing Manipulation and Hygiene: Contact guard assist Functional mobility during ADLs: Moderate assistance, +2 for physical assistance General ADL Comments: Completing ADLs EOB, good  dynamic sitting balance, ability to wt shift for LB ADLs     Mobility   Overal bed mobility: Needs Assistance Bed Mobility: Sit to Supine, Supine to Sit Supine to sit: HOB elevated, Used rails, Modified independent (Device/Increase time) Sit to supine: Contact guard assist, HOB elevated, Used rails General bed mobility comments: pt able to pull herself into long sitting and then scoot herself to the EoB where se was able to don her     Transfers   Overall transfer level: Needs assistance Equipment used: Rolling walker (2 wheels), 2 person hand held assist Transfers: Sit to/from Stand, Bed to chair/wheelchair/BSC Sit to Stand: Mod assist, +2 physical assistance Bed to/from chair/wheelchair/BSC transfer type:: Stand pivot Stand pivot transfers: +2 physical assistance, Mod assist General transfer comment: mod Ax2 for power up to standing with pt holding onto therapists arms, once in standing, Pt able to remove her L hand from therapist and reach towards arm rest of BSC to pivot and sit. After finished on BSC pt able to pivot to recliner with modAx2     Ambulation / Gait / Stairs / Clinical biochemist / Balance Balance Overall balance assessment: Needs assistance Sitting-balance support: No upper extremity supported, Feet supported Sitting balance-Leahy Scale: Good Standing balance support: During functional activity, Reliant on assistive device for balance Standing balance-Leahy Scale: Zero     Special needs/care consideration Wound Vac leg/left, distal, Skin Pressure injury-Stage 1: ankle/right, lateral; Surgical Incision: left, proximal; , Diabetic management  Semglee  10 units daily at bedtime; Novolog  0-5 units daily at bedtime; Novolog  0-6 units 3x daily with meals and Special service needs Orange contact precautions    Previous Home Environment  Living Arrangements: Alone Available Help at Discharge: Family, Available 24 hours/day Type of Home: House Home Layout:  One level Home Access: Ramped entrance Bathroom Shower/Tub: Walk-in shower (6 lip) Bathroom Toilet: Handicapped height Bathroom Accessibility: Yes How Accessible: Accessible via wheelchair,  Accessible via walker Home Care Services: No Additional Comments: mom lives across the street   Discharge Living Setting Plans for Discharge Living Setting: Patient's home Type of Home at Discharge: House Discharge Home Layout: One level Discharge Home Access: Ramped entrance Discharge Bathroom Shower/Tub: Walk-in shower Discharge Bathroom Toilet: Handicapped height Discharge Bathroom Accessibility: Yes How Accessible: Accessible via wheelchair, Accessible via walker Does the patient have any problems obtaining your medications?: No   Social/Family/Support Systems Anticipated Caregiver: Adryan Druckenmiller, mother Anticipated Caregiver's Contact Information: [phone] Caregiver Availability: 24/7 Discharge Plan Discussed with Primary Caregiver: Yes Is Caregiver In Agreement with Plan?: Yes Does Caregiver/Family have Issues with Lodging/Transportation while Pt is in Rehab?: No   Goals Patient/Family Goal for Rehab: Supervision: PT/OT Expected length of stay: 10 days Pt/Family Agrees to Admission and willing to participate: Yes Program Orientation Provided & Reviewed with Pt/Caregiver Including Roles  & Responsibilities: Yes   Decrease burden of Care through IP rehab admission: NA   Possible need for SNF placement upon discharge: Not anticipated   Patient Condition: I have reviewed medical records from [facility], spoken with CM, and patient and family member. I met with patient at the bedside for inpatient rehabilitation assessment.  Patient will benefit from ongoing PT and OT, can actively participate in 3 hours of therapy a day 5 days of the week, and can make measurable gains during the admission.  Patient will also benefit from the coordinated team approach during an Inpatient  Acute Rehabilitation admission.  The patient will receive intensive therapy as well as Rehabilitation physician, nursing, social worker, and care management interventions.  Due to safety, skin/wound care, disease management, medication administration, pain management, and patient education the patient requires 24 hour a day rehabilitation nursing.  The patient is currently Mod assist overall with mobility and basic ADLs.  Discharge setting and therapy post discharge at home with home health is anticipated.  Patient has agreed to participate in the Acute Inpatient Rehabilitation Program and will admit today.   Preadmission Screen Completed By:  Tinnie SHAUNNA Yvone Delayne, 10/29/2023 11:17 AM ______________________________________________________________________   Discussed status with Dr. Cornelio on 10/29/23 at 1113 and received approval for admission today.   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 Yes Co-Morbidities requiring supervision/potential complications: IDDM, multivessel CAD; PAD, hx of GI bleed due to AVM, chronic anemia; mod-severe aortic stenosis; hypothyroidism Due to bladder management, bowel management, safety, skin/wound care, disease management, medication administration, pain management, and patient education, does the patient require 24 hr/day rehab nursing? Yes Does the patient require coordinated care of a physician, rehab nurse, PT, OT, and SLP to address physical and functional deficits in the context of the above medical diagnosis(es)? Yes Addressing deficits in the following areas: balance, endurance, locomotion, strength, transferring, bowel/bladder control, bathing, dressing, feeding,  grooming, and toileting Can the patient actively participate in an intensive therapy program of at least 3 hrs of therapy 5 days a week? Yes The potential for patient to make measurable gains while on inpatient rehab is good Anticipated functional outcomes upon discharge from inpatient rehab: supervision PT, supervision OT, n/a SLP Estimated rehab length of stay to reach the above functional goals is: 10-12 days Anticipated discharge  destination: Home 10. Overall Rehab/Functional Prognosis: good     MD Signature:            Revision History

## 2023-10-30 DIAGNOSIS — Z89512 Acquired absence of left leg below knee: Secondary | ICD-10-CM | POA: Diagnosis not present

## 2023-10-30 LAB — CBC WITH DIFFERENTIAL/PLATELET
Abs Immature Granulocytes: 0.03 10*3/uL (ref 0.00–0.07)
Basophils Absolute: 0 10*3/uL (ref 0.0–0.1)
Basophils Relative: 0 %
Eosinophils Absolute: 0.1 10*3/uL (ref 0.0–0.5)
Eosinophils Relative: 2 %
HCT: 24.2 % — ABNORMAL LOW (ref 36.0–46.0)
Hemoglobin: 7.3 g/dL — ABNORMAL LOW (ref 12.0–15.0)
Immature Granulocytes: 1 %
Lymphocytes Relative: 32 %
Lymphs Abs: 1.7 10*3/uL (ref 0.7–4.0)
MCH: 29 pg (ref 26.0–34.0)
MCHC: 30.2 g/dL (ref 30.0–36.0)
MCV: 96 fL (ref 80.0–100.0)
Monocytes Absolute: 0.4 10*3/uL (ref 0.1–1.0)
Monocytes Relative: 8 %
Neutro Abs: 3.1 10*3/uL (ref 1.7–7.7)
Neutrophils Relative %: 57 %
Platelets: 247 10*3/uL (ref 150–400)
RBC: 2.52 MIL/uL — ABNORMAL LOW (ref 3.87–5.11)
RDW: 15.5 % (ref 11.5–15.5)
Smear Review: NORMAL
WBC: 5.3 10*3/uL (ref 4.0–10.5)
nRBC: 0 % (ref 0.0–0.2)

## 2023-10-30 LAB — COMPREHENSIVE METABOLIC PANEL WITH GFR
ALT: 14 U/L (ref 0–44)
AST: 23 U/L (ref 15–41)
Albumin: 1.5 g/dL — ABNORMAL LOW (ref 3.5–5.0)
Alkaline Phosphatase: 125 U/L (ref 38–126)
Anion gap: 8 (ref 5–15)
BUN: 16 mg/dL (ref 6–20)
CO2: 25 mmol/L (ref 22–32)
Calcium: 7.9 mg/dL — ABNORMAL LOW (ref 8.9–10.3)
Chloride: 104 mmol/L (ref 98–111)
Creatinine, Ser: 1.14 mg/dL — ABNORMAL HIGH (ref 0.44–1.00)
GFR, Estimated: 56 mL/min — ABNORMAL LOW (ref >60)
Glucose, Bld: 126 mg/dL — ABNORMAL HIGH (ref 70–99)
Potassium: 4.1 mmol/L (ref 3.5–5.1)
Sodium: 137 mmol/L (ref 135–145)
Total Bilirubin: 0.6 mg/dL (ref 0.0–1.2)
Total Protein: 5.5 g/dL — ABNORMAL LOW (ref 6.5–8.1)

## 2023-10-30 LAB — GLUCOSE, CAPILLARY
Glucose-Capillary: 117 mg/dL — ABNORMAL HIGH (ref 70–99)
Glucose-Capillary: 113 mg/dL — ABNORMAL HIGH (ref 70–99)
Glucose-Capillary: 134 mg/dL — ABNORMAL HIGH (ref 70–99)
Glucose-Capillary: 118 mg/dL — ABNORMAL HIGH (ref 70–99)

## 2023-10-30 LAB — VITAMIN D 25 HYDROXY (VIT D DEFICIENCY, FRACTURES): Vit D, 25-Hydroxy: 42.44 ng/mL (ref 30–100)

## 2023-10-30 LAB — MAGNESIUM: Magnesium: 1.7 mg/dL (ref 1.7–2.4)

## 2023-10-30 LAB — OCCULT BLOOD X 1 CARD TO LAB, STOOL: Fecal Occult Bld: NEGATIVE

## 2023-10-30 MED ORDER — ZINC SULFATE 220 (50 ZN) MG PO CAPS
220.0000 mg | ORAL_CAPSULE | Freq: Every day | ORAL | Status: AC
  Administered 2023-10-30 – 2023-11-12 (×14): 220 mg via ORAL
  Filled 2023-10-30 (×14): qty 1

## 2023-10-30 MED ORDER — VITAMIN C 500 MG PO TABS
250.0000 mg | ORAL_TABLET | Freq: Every day | ORAL | Status: DC
  Administered 2023-10-30 – 2023-11-12 (×14): 250 mg via ORAL
  Filled 2023-10-30 (×14): qty 1

## 2023-10-30 MED ORDER — MAGNESIUM SULFATE 2 GM/50ML IV SOLN
2.0000 g | Freq: Once | INTRAVENOUS | Status: AC
  Administered 2023-10-30: 2 g via INTRAVENOUS
  Filled 2023-10-30: qty 50

## 2023-10-30 MED ORDER — MUPIROCIN CALCIUM 2 % EX CREA
TOPICAL_CREAM | Freq: Every day | CUTANEOUS | Status: DC
  Filled 2023-10-30: qty 15

## 2023-10-30 MED ORDER — VITAMIN D 25 MCG (1000 UNIT) PO TABS
1000.0000 [IU] | ORAL_TABLET | Freq: Every day | ORAL | Status: DC
  Administered 2023-10-30 – 2023-11-12 (×14): 1000 [IU] via ORAL
  Filled 2023-10-30 (×14): qty 1

## 2023-10-30 NOTE — Progress Notes (Signed)
 Inpatient Rehabilitation Care Coordinator Assessment and Plan Patient Details  Name: Kayla Schmitt MRN: [id number] Date of Birth: 1966/02/10  Today's Date: 10/30/2023  Hospital Problems: Principal Problem:   S/P BKA (below knee amputation) unilateral, left (HCC) Active Problems:   Unilateral complete BKA, left, sequela (HCC)  Past Medical History:  Past Medical History:  Diagnosis Date   Anemia    Aortic stenosis    Arthritis    CHF (congestive heart failure) (HCC)    Coronary artery disease    Diabetes mellitus without complication (HCC)    type 2   GERD (gastroesophageal reflux disease)    Heart murmur    History of blood transfusion    Hypercholesteremia    Hypertension    Hypothyroidism    Mitral regurgitation    Myocardial infarction (HCC)    Peripheral vascular disease (HCC)    PONV (postoperative nausea and vomiting)    Thyroid disease    Past Surgical History:  Past Surgical History:  Procedure Laterality Date   ABDOMINAL AORTOGRAM W/LOWER EXTREMITY Bilateral 01/14/2020   Procedure: ABDOMINAL AORTOGRAM W/LOWER EXTREMITY;  Surgeon: Gretta Lonni PARAS, MD;  [facility]: [facility];  Service: Cardiovascular;  Laterality: Bilateral;   ABDOMINAL AORTOGRAM W/LOWER EXTREMITY N/A 07/05/2022   Procedure: ABDOMINAL AORTOGRAM W/LOWER EXTREMITY;  Surgeon: Gretta Lonni PARAS, MD;  Location: [facility];  Service: Cardiovascular;  Laterality: N/A;   ABDOMINAL AORTOGRAM W/LOWER EXTREMITY N/A 08/02/2022   Procedure: ABDOMINAL AORTOGRAM W/LOWER EXTREMITY;  Surgeon: Gretta Lonni PARAS, MD;  [facility]: [facility];  Service: Cardiovascular;  Laterality: N/A;   ABDOMINAL AORTOGRAM W/LOWER EXTREMITY N/A 08/06/2022   Procedure: ABDOMINAL AORTOGRAM W/LOWER EXTREMITY;  Surgeon: Gretta Lonni PARAS, MD;  [facility]: [facility];  Service: Cardiovascular;  Laterality: N/A;   AMPUTATION Bilateral 07/20/2022   Procedure: AMPUTATION RIGHT GREAT TOE AND SECOND  TOE, AMPUTATION LEFT GREAT TOE;  Surgeon: Harden Jerona GAILS, MD;  Location: [facility] OR;  Service: Orthopedics;  Laterality: Bilateral;   AMPUTATION Bilateral 08/08/2022   Procedure: BILATERAL TRANSMETATARSAL AMPUTATION;  Surgeon: Harden Jerona GAILS, MD;  Location: [facility] OR;  Service: Orthopedics;  Laterality: Bilateral;   AMPUTATION Left 10/22/2023   Procedure: AMPUTATION BELOW KNEE;  Surgeon: Lanis Fonda BRAVO, MD;  Location: [facility] OR;  Service: Vascular;  Laterality: Left;   AMPUTATION TOE     BIOPSY  04/06/2023   Procedure: BIOPSY;  Surgeon: Alexes Elspeth SQUIBB, MD;  Location: [facility] ENDOSCOPY;  Service: Gastroenterology;;   COLONOSCOPY WITH PROPOFOL  N/A 04/06/2023   Procedure: COLONOSCOPY WITH PROPOFOL ;  Surgeon: Katricia Elspeth SQUIBB, MD;  Location: [facility] ENDOSCOPY;  Service: Gastroenterology;  Laterality: N/A;   ESOPHAGOGASTRODUODENOSCOPY (EGD) WITH PROPOFOL  N/A 04/06/2023   Procedure: ESOPHAGOGASTRODUODENOSCOPY (EGD) WITH PROPOFOL ;  Surgeon: Shawnese Elspeth SQUIBB, MD;  Location: [facility] ENDOSCOPY;  Service: Gastroenterology;  Laterality: N/A;   GIVENS CAPSULE STUDY N/A 04/06/2023   Procedure: GIVENS CAPSULE STUDY;  Surgeon: Shaquavia Elspeth SQUIBB, MD;  Location: [facility] ENDOSCOPY;  Service: Gastroenterology;  Laterality: N/A;   PERIPHERAL VASCULAR ATHERECTOMY Right 01/14/2020   Procedure: PERIPHERAL VASCULAR ATHERECTOMY;  Surgeon: Gretta Lonni PARAS, MD;  Location: [facility] INVASIVE CV LAB;  Service: Cardiovascular;  Laterality: Right;  sfa   PERIPHERAL VASCULAR INTERVENTION Right 01/14/2020   Procedure: PERIPHERAL VASCULAR INTERVENTION;  Surgeon: Gretta Lonni PARAS, MD;  [facility];  Service: Cardiovascular;  Laterality: Right;  sfa stent    PERIPHERAL VASCULAR INTERVENTION  07/05/2022   Procedure: PERIPHERAL VASCULAR INTERVENTION;  Surgeon: Gretta Lonni PARAS, MD;  [facility];  Service: Cardiovascular;;   PERIPHERAL VASCULAR INTERVENTION  08/02/2022   Procedure: PERIPHERAL VASCULAR INTERVENTION;   Surgeon: Gretta Lonni PARAS, MD;  [facility];  Service: Cardiovascular;;   PERIPHERAL VASCULAR INTERVENTION  08/06/2022   Procedure: PERIPHERAL VASCULAR INTERVENTION;  Surgeon: Gretta Lonni PARAS, MD;  Location: [facility] INVASIVE CV LAB;  Service: Cardiovascular;;   PERIPHERAL VASCULAR THROMBECTOMY  08/02/2022   Procedure: PERIPHERAL VASCULAR THROMBECTOMY;  Surgeon: Gretta Lonni PARAS, MD;  [facility];  Service: Cardiovascular;;   REVISION AMPUTATION, BELOW THE KNEE Left 10/23/2023   Procedure: REVISION AMPUTATION, BELOW THE KNEE;  Surgeon: Harden Jerona GAILS, MD;  Location: [facility] OR;  Service: Orthopedics;  Laterality: Left;  REVISION LEFT BELOW KNEE AMPUTATION   RIGHT/LEFT HEART CATH AND CORONARY ANGIOGRAPHY N/A 08/23/2022   Procedure: RIGHT/LEFT HEART CATH AND CORONARY ANGIOGRAPHY;  Surgeon: Dann Candyce RAMAN, MD;  Location: [facility] INVASIVE CV LAB;  Service: Cardiovascular;  Laterality: N/A;   SKIN SPLIT GRAFT Right 03/18/2020   Procedure: SKIN GRAFTING RIGHT FOOT ULCER;  Surgeon: Harden Jerona GAILS, MD;  Location: [facility] OR;  Service: Orthopedics;  Laterality: Right;   STUMP REVISION Right 11/30/2022   Procedure: REVISION RIGHT TRANSMETATARSAL AMPUTATION;  Surgeon: Harden Jerona GAILS, MD;  Location: [facility] OR;  Service: Orthopedics;  Laterality: Right;   TEE WITHOUT CARDIOVERSION N/A 08/27/2022   Procedure: TRANSESOPHAGEAL ECHOCARDIOGRAM;  Surgeon: Hobart Powell BRAVO, MD;  Location: [facility] INVASIVE CV LAB;  Service: Cardiovascular;  Laterality: N/A;   TRANSESOPHAGEAL ECHOCARDIOGRAM (CATH LAB) N/A 10/24/2023   Procedure: TRANSESOPHAGEAL ECHOCARDIOGRAM;  Surgeon: Michele Richardson, DO;  [facility];  Service: Cardiovascular;  Laterality: N/A;   WISDOM TOOTH EXTRACTION     Social History:  reports that she quit smoking about 6 years ago. Her smoking use included cigarettes. She has never used smokeless tobacco. She reports that she does not currently use alcohol. She reports current  drug use. Drug: Marijuana.  Family / Support Systems Marital Status: Divorced Patient Roles: Other (Comment) (employee/daughter) Other Supports: [name]-mom [phone] Anticipated Caregiver: Mom Ability/Limitations of Caregiver: Mom is in good health and can assist if needed. Pt hopeful she will be mod/i wheelchair level by time she leaves Caregiver Availability: 24/7 Family Dynamics: Close with mom and colleages and friends. She feels she has good socail suports.  Social History Preferred language: English Religion:  Cultural Background: NA Education: HS Health Literacy - How often do you need to have someone help you when you read instructions, pamphlets, or other written material from your doctor or pharmacy?: Never Writes: Yes Employment Status: Employed Name of Employer: Office manager Return to Work Plans: Plans to once can recover and get back to work Marine scientist Issues: NA Guardian/Conservator: None-according to MD pt is capable of making her own decisions while here   Abuse/Neglect Abuse/Neglect Assessment Can Be Completed: Yes Physical Abuse: Denies Verbal Abuse: Denies Sexual Abuse: Denies Exploitation of patient/patient's resources: Denies Self-Neglect: Denies  Patient response to: Social Isolation - How often do you feel lonely or isolated from those around you?: Never  Emotional Status Pt's affect, behavior and adjustment status: Pt is motivated to recover and regain her independence. She is not one to rely upon others and has always been able to take care of herself. Recent Psychosocial Issues: other health issues dealing with right leg wound also Psychiatric History: No history seems to be coping appropriately and moving forward in her recovery. Substance Abuse History: NA  Patient /  Family Perceptions, Expectations & Goals Pt/Family understanding of illness & functional limitations: Pt can explain her amputation and other wound issues with  her other leg. Which was her good leg until this happen. She does talk with the MD's involved and hopes to do well here. Premorbid pt/family roles/activities: daughter, emplyee, freind, etc Anticipated changes in roles/activities/participation: resume Pt/family expectations/goals: Pt states:   I hope to be able to take care of myself and at least be independent from wheelchair.  Community [employer] Agencies: None Premorbid Home Care/DME Agencies: Other (Comment) (wc, rw, tub seat, rollator and bsc) Transportation available at discharge: self and now mom may assist Is the patient able to respond to transportation needs?: Yes In the past 12 months, has lack of transportation kept you from medical appointments or from getting medications?: No In the past 12 months, has lack of transportation kept you from meetings, work, or from getting things needed for daily living?: No  Discharge Planning Living Arrangements: Alone Support Systems: Parent, Other relatives, Friends/neighbors Type of Residence: Private residence Insurance Resources: Media planner (specify) Counselling psychologist) Financial Resources: Employment Surveyor, quantity Screen Referred: No Living Expenses: Banker Management: Patient Does the patient have any problems obtaining your medications?: No Home Management: self Patient/Family Preliminary Plans: Return home with Mom assisting if needed. Mom is in good health and can assist. Pt is hopeful she will not need assist and can be mod/i from a wheelchair level. Will await evaluations today and update if able from team conference Care Coordinator Barriers to Discharge: Insurance for SNF coverage Care Coordinator Anticipated Follow Up Needs: HH/OP  Clinical Impression Pleasant female who is motivated to do well her and recover from her amputation Have touched base with [facility] for infusion in case needed. Will update once evaluations are complete. Aware can bring in paperwork  for work to be completed  Kayla Schmitt 10/30/2023, 9:43 AM

## 2023-10-30 NOTE — Plan of Care (Signed)
  Problem: Consults Goal: RH LIMB LOSS PATIENT EDUCATION Description: Description: See Patient Education module for eduction specifics. Outcome: Progressing   Problem: RH SAFETY Goal: RH STG ADHERE TO SAFETY PRECAUTIONS W/ASSISTANCE/DEVICE Description: STG Adhere to Safety Precautions With cues Assistance/Device. Outcome: Progressing

## 2023-10-30 NOTE — Progress Notes (Signed)
 Inpatient Rehabilitation  Patient information reviewed and entered into eRehab system by [facility]. Karen Kays., CCC/SLP, PPS Coordinator.  Information including medical coding, functional ability and quality indicators will be reviewed and updated through discharge.

## 2023-10-30 NOTE — Progress Notes (Signed)
 Physical Therapy Session Note  Patient Details  Name: Kayla Schmitt MRN: [id number] Date of Birth: 1965-05-13  Today's Date: 10/30/2023 PT Individual Time: 1302-1426 PT Individual Time Calculation (min): 84 min   Short Term Goals: Week 1:  PT Short Term Goal 1 (Week 1): pt will transfer bed<>chair with LRAD and CGA PT Short Term Goal 2 (Week 1): pt will transfer sit<>stand with LRAD and CGA PT Short Term Goal 3 (Week 1): pt will perform simulated car transfer with LRAD and CGA  Skilled Therapeutic Interventions/Progress Updates:   Received pt siting in [facility] with mother at bedside. Pt agreeable to PT treatment and did not state pain level during session. Session with emphasis on functional mobility/transfers, generalized strengthening and endurance, dynamic standing balance/coordination, and limb loss education.   Discussed and provided pt with the following limb loss resources: -amputee support group of the tri[place] -balance amputee wellness brochure -limb protector -contracture prevention -shrinker care guidelines -prosthetic fitting timeline Provided pt with First Step Amputee booklet and answered pt/pt's mother's questions. Pt then performed WC mobility 160ft using BUE and supervision with emphasis on UE strength and coordination. Pt performed lateral scoot to/from mat with CGA and noted dressing on blister on posterior R thigh came off - applied new bandage. Pt stood x 3 trials from EOM with RW and min A and performed R hip abduction 2x10 with CGA for balance; then performed 1 stand with RW with CGA - pt prefers to stand with BUE support on RW but need to continue practicing proper technique.   Pt then performed the following seated exercises with emphasis on LE, UE, and core strength: -knee extension 2x20 bilaterally with 2.5lb ankle weight on RLE -hip flexion 2x20 with 2.5lb ankle weight on RLE -lateral trunk rotations with 6lb medicine ball 2x10 bilaterally -bicep curls with 7lb  dumbbell 2x15 bilaterally -tricep extensions on yoga blocks 2x10 -overhead press<>horizontal chest press with 5lb dowel 2x10 each Transported back to room in WC dependently and concluded session with pt sitting in WC with all needs within reach. RN notified of request for pain medication.    Therapy Documentation Precautions:  Restrictions Weight Bearing Restrictions Per Provider Order: Yes LLE Weight Bearing Per Provider Order: Non weight bearing  Therapy/Group: Individual Therapy Therisa HERO Zaunegger Therisa Stains PT, DPT 10/30/2023, 7:09 AM

## 2023-10-30 NOTE — Patient Care Conference (Signed)
 Inpatient RehabilitationTeam Conference and Plan of Care Update Date: 10/30/2023   Time: 11:48 AM    Patient Name: Kayla Schmitt      Medical Record Number: [id number]  Date of Birth: Sep 08, 1965 Sex: Female         Room/Bed: 4W12C/4W12C-02 Payor Info: Payor: CIGNA / Plan: CIGNA MANAGED / Product Type: *No Product type* /    Admit Date/Time:  10/29/2023  2:07 PM  Primary Diagnosis:  S/P BKA (below knee amputation) unilateral, left [facility])  Hospital Problems: Principal Problem:   S/P BKA (below knee amputation) unilateral, left (HCC) Active Problems:   Unilateral complete [facility], [facility])    Expected Discharge Date: Expected Discharge Date: 11/11/23  Team Members Present: Physician leading conference: Dr. Sven Elks Social Worker Present: Rhoda Clement, LCSW Nurse Present: Barnie Ronde, RN PT Present: Therisa Stains, PT OT Present: Lorrayne Fritter, OT PPS Coordinator present : Eleanor Colon, SLP     Current Status/Progress Goal Weekly Team Focus  Bowel/Bladder      Continent of bladder; constipation addressed    Continent of bowel and bladder    Assess need for and effectiveness of laxatives  Swallow/Nutrition/ Hydration               ADL's   Min A UB, Max A LB and toileting   Mod I   Barriers- RLE weakness, dynamic standing balnace, limb care edu, functional endurance    Mobility   bed mobility supervision, transfers with RW min A, WC mobility 158ft supervision   Mod I  barriers: pain, decreased balance, condition of contralateral limb, limb loss education, weakness/deconditioning    Communication                Safety/Cognition/ Behavioral Observations               Pain      Scheduled and prn medications with neuropathic medications    Pain < 4 with prns    Assess effectiveness of medications and need for prns  Skin      Wound vac; left leg, non-healing incision wound on right foot; prevalon boot in use.   Incision healing     Change to Prevena 10/30/23 and remove after a week    Discharge Planning:  New evaluation-home with Mom assisting if needed. Hopes to be mod/i wheelchair level. Await evaluations   Team Discussion: Patient post left BKA with old right trans-metatarsal amputation. Patient limited by pain, and poor endurance.  Patient on target to meet rehab goals: yes, currently needs min assist for transfers. Completes upper body care with min assist and needs max assist for lower body care and toileting. Completes squat pivot transfers with min - mod assist. Goals for discharge set for mod I overall.  *See Care Plan and progress notes for long and short-term goals.   Revisions to Treatment Plan:  Dietician consult   Teaching Needs: Safety, medications, skin care, residual limb care, nutritional supplements/malnutrition, transfers, toileting, etc.  Current Barriers to Discharge: Decreased caregiver support  Possible Resolutions to Barriers: Family education OP follow up services     Medical Summary Current Status: right foot wound, class 2 obesity, left BKA, MRSA infection, acute kidney injury. low protein  Barriers to Discharge: Medical stability;Infection/IV Antibiotics;Morbid Obesity  Barriers to Discharge Comments: right foot wound, class 2 obesity, left BKA, MRSA infection, acute kidney injury, low protein Possible Resolutions to [employer] Focus: WOC consulted, provided dietary education, ordered switch to prevena, placed order for wound  vac removal 7/9, vitamin C  anc zinc  started, continue to monitor creatinine, continue Juven   Continued Need for Acute Rehabilitation Level of Care: The patient requires daily medical management by a physician with specialized training in physical medicine and rehabilitation for the following reasons: Direction of a multidisciplinary physical rehabilitation program to maximize functional independence : Yes Medical management of patient stability for  increased activity during participation in an intensive rehabilitation regime.: Yes Analysis of laboratory values and/or radiology reports with any subsequent need for medication adjustment and/or medical intervention. : Yes   I attest that I was present, lead the team conference, and concur with the assessment and plan of the team.   Fredericka Sober B 10/30/2023, 2:53 PM

## 2023-10-30 NOTE — Progress Notes (Addendum)
 PROGRESS NOTE   Subjective/Complaints: Asks when wound vac will be removed Protein low- on prosource Magnesium  low- 2grams ordered IV, discussed with patient  ROS: pain is well controlled  Objective:   No results found. Recent Labs    10/30/23 0423  WBC 5.3  HGB 7.3*  HCT 24.2*  PLT 247   Recent Labs    10/30/23 0423  NA 137  K 4.1  CL 104  CO2 25  GLUCOSE 126*  BUN 16  CREATININE 1.14*  CALCIUM  7.9*    Intake/Output Summary (Last 24 hours) at 10/30/2023 0940 Last data filed at 10/30/2023 0719 Gross per 24 hour  Intake 600 ml  Output 0 ml  Net 600 ml        Physical Exam: Vital Signs Blood pressure (!) 151/66, pulse 81, temperature 98.9 F (37.2 C), temperature source Oral, resp. rate 18, height 5' 4 (1.626 m), weight 96.5 kg, SpO2 99%. Gen: no distress, normal appearing HEENT: oral mucosa pink and moist, NCAT Cardio: Reg rate Chest: normal effort, normal rate of breathing Abd: soft, non-distended Ext: no edema Psych: pleasant, normal affect Skin: intact Musculoskeletal:     Cervical back: Neck supple. No tenderness.     Comments: Ue's 5/5 B/L throughout RLE HF 4+/5; KE/KF 5-/5; and DF/PF at least 3/5- has old TMA amputation LLE- HF 4/5; KE/KF at least 4-/5- hard ot rest due to pain/VAC with L BKA    Skin:    General: Skin is warm and dry.     Comments: Small scabbed area latera aspect of right transmetatarsal site (question drainage on sock) Left thigh with pressure area medially due to limb guard. Wound VAC in place on L-BKA- good seal. . Popped blister- large ~ 3 inches in diameter on medial posterior aspect of L thigh from LLE limb guard L posterior/medial above knee/on thigh- rubbed area from limb guard- abraded skin in shape of top of limb guard    Neurological:     Mental Status: She is alert and oriented to person, place, and time.     Comments: Decreased to light touch on RLE to mid  calf- intact on LLE/L BKA  Psychiatric:        Mood and Affect: Mood normal.        Behavior: Behavior normal.     Assessment/Plan: 1. Functional deficits which require 3+ hours per day of interdisciplinary therapy in a comprehensive inpatient rehab setting. Physiatrist is providing close team supervision and 24 hour management of active medical problems listed below. Physiatrist and rehab team continue to assess barriers to discharge/monitor patient progress toward functional and medical goals  Care Tool:  Bathing              Bathing assist       Upper Body Dressing/Undressing Upper body dressing        Upper body assist      Lower Body Dressing/Undressing Lower body dressing            Lower body assist       Toileting Toileting    Toileting assist       Transfers Chair/bed transfer  Transfers assist  Locomotion Ambulation   Ambulation assist              Walk 10 feet activity   Assist           Walk 50 feet activity   Assist           Walk 150 feet activity   Assist           Walk 10 feet on uneven surface  activity   Assist           Wheelchair     Assist               Wheelchair 50 feet with 2 turns activity    Assist            Wheelchair 150 feet activity     Assist          Blood pressure (!) 151/66, pulse 81, temperature 98.9 F (37.2 C), temperature source Oral, resp. rate 18, height 5' 4 (1.626 m), weight 96.5 kg, SpO2 99%.   Medical Problem List and Plan: 1. 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  Antithrombotics: -DVT/anticoagulation:  Pharmaceutical: Lovenox              -antiplatelet therapy: continue plavix .   3. Pain Management: continue Oxycodone   prn. Will increase home dose gabapentin  to 300 mg TID- due to her younger age, should be able ot handle the dose- she reports phantom pain feels on fire. Took Gabapentin  at home prior for Restless leg syndrome.   4. Mood/Behavior/Sleep: LCSW to follow for evaluation and support.               -antipsychotic agents: n/a 5. Neuropsych/cognition: This patient is capable of making decisions on her own behalf. 6. Skin/Wound Care: Pressure relief measures. Protein supplements added. Monitor for recurrent drainage.             --foam dressing to area on left thigh.   7. Fluids/Electrolytes/Nutrition: Monitor I/O. Check CMET in am 8.  MRSA bacteremia: Last CK 522-->continue Daptomycin  with EOT  11/17/23             --weekly CRP, Sed rate, CPK in addition to CBC/BMP.             --follow up with Dr. Dea 07/17 @ 9:30 am prior to EOT. Zinc , Vitamin C , Vitamin D supplements started   9. T2DM on chronic insulin : Hgb A1C- 5.5 and well controlled (down from 6.4 earlier this year). Monitor BS ac/hs and use SSI for elevated BS.              --continue insulin  glargine 10 units and SSI was added today             --BS variable from 89- 377 in past 24 hours.   -provided list of foods for diabetes 10. Severe PAD s/p stents: On Plavix  and atorvastatin .  Monitor chronically draining area rea on right transmet site.              --educated on increasing protein, low salt as well as monitoring and protection of residual right foot.   11. 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 Hgb decreasing, repeat tomorrow, stool occult ordered  12.  CAD/HFpEF w/EF  - EF 60-65%: Elevated troponin felt to be due to demand ischemia --Down to 501 on 06/23.    13.   Mod to severe Aortic stenosis/Moderate MR: Strict I/O w/daily weights. Monitor for signs of overload.   14.  Severe malnutrition: Albumin  level <1.5 w/ calcium  7.6-->corrects to 9.5. RD consulted  15.  Abnormal LFTs: resolved   LOS: 1  days A FACE TO FACE EVALUATION WAS PERFORMED  Sven SQUIBB Paula Zietz 10/30/2023, 9:40 AM

## 2023-10-30 NOTE — Progress Notes (Signed)
 [facility] Individual Statement of Services  Patient Name:  Kayla Schmitt  Date:  10/30/2023  Welcome to the [facility].  Our goal is to provide you with an individualized program based on your diagnosis and situation, designed to meet your specific needs.  With this comprehensive rehabilitation program, you will be expected to participate in at least 3 hours of rehabilitation therapies Monday-Friday, with modified therapy programming on the weekends.  Your rehabilitation program will include the following services:  Physical Therapy (PT), Occupational Therapy (OT), 24 hour per day rehabilitation nursing, Therapeutic Recreaction (TR), Care Coordinator, Rehabilitation Medicine, Nutrition Services, and Pharmacy Services  Weekly team conferences will be held on Wednesday to discuss your progress.  Your Inpatient Rehabilitation Care Coordinator will talk with you frequently to get your input and to update you on team discussions.  Team conferences with you and your family in attendance may also be held.  Expected length of stay: 10-12 days  Overall anticipated outcome: independent-supervision level  Depending on your progress and recovery, your program may change. Your Inpatient Rehabilitation Care Coordinator will coordinate services and will keep you informed of any changes. Your Inpatient Rehabilitation Care Coordinator's name and contact numbers are listed  below.  The following services may also be recommended but are not provided by the [facility]:  Driving Evaluations Home Health Rehabiltiation Services Outpatient Rehabilitation Services Vocational Rehabilitation   Arrangements will be made to provide these services after discharge if needed.  Arrangements include referral to agencies that provide these services.  Your insurance has been verified to be:  [place] Your primary doctor is:  Jacquline [facility]  Pertinent  information will be shared with your doctor and your insurance company.  Inpatient Rehabilitation Care Coordinator:  Rhoda Clement, KEN [phone] or ELIGAH BASQUES  Information discussed with and copy given to patient by: Clement Asberry MATSU, 10/30/2023, 9:46 AM

## 2023-10-30 NOTE — Consult Note (Addendum)
 WOC Nurse Consult Note: Reason for Consult: Consult requested for right foot wounds.  Performed remotely after review of progress notes and photos in the EMR. Right heel with dark red-purple Deep tissue pressure injury with intact skin.  Pressure Injury POA: Yes Right anterior foot with full thickness wounds to anterior upper and outer skin fold area, red and dry Dressing procedure/placement/frequency: Prevalon boot has already been ordered to float heels to reduce pressure. Topical treatment orders provided for bedside nurses to perform as follows t promote moist healing:  Apply Bactroban to right foot wounds Q day, then cover with foam dressings.  Change foam dresings Q 3 days or PRN soiling. Please re-consult if further assistance is needed.  Thank-you,  Stephane Fought MSN, RN, CWOCN, [place], CNS [phone]

## 2023-10-30 NOTE — Plan of Care (Signed)
  Problem: RH Balance Goal: LTG Patient will maintain dynamic standing with ADLs (OT) Description: LTG:  Patient will maintain dynamic standing balance with assist during activities of daily living (OT)  Flowsheets (Taken 10/30/2023 1225) LTG: Pt will maintain dynamic standing balance during ADLs with: Supervision/Verbal cueing   Problem: Sit to Stand Goal: LTG:  Patient will perform sit to stand in prep for activites of daily living with assistance level (OT) Description: LTG:  Patient will perform sit to stand in prep for activites of daily living with assistance level (OT) Flowsheets (Taken 10/30/2023 1225) LTG: PT will perform sit to stand in prep for activites of daily living with assistance level: Independent with assistive device   Problem: RH Bathing Goal: LTG Patient will bathe all body parts with assist levels (OT) Description: LTG: Patient will bathe all body parts with assist levels (OT) Flowsheets (Taken 10/30/2023 1225) LTG: Pt will perform bathing with assistance level/cueing: Independent with assistive device    Problem: RH Dressing Goal: LTG Patient will perform upper body dressing (OT) Description: LTG Patient will perform upper body dressing with assist, with/without cues (OT). Flowsheets (Taken 10/30/2023 1225) LTG: Pt will perform upper body dressing with assistance level of: Independent with assistive device Goal: LTG Patient will perform lower body dressing w/assist (OT) Description: LTG: Patient will perform lower body dressing with assist, with/without cues in positioning using equipment (OT) Flowsheets (Taken 10/30/2023 1225) LTG: Pt will perform lower body dressing with assistance level of: Independent with assistive device   Problem: RH Toileting Goal: LTG Patient will perform toileting task (3/3 steps) with assistance level (OT) Description: LTG: Patient will perform toileting task (3/3 steps) with assistance level (OT)  Flowsheets (Taken 10/30/2023 1225) LTG: Pt will  perform toileting task (3/3 steps) with assistance level: Independent with assistive device   Problem: RH Simple Meal Prep Goal: LTG Patient will perform simple meal prep w/assist (OT) Description: LTG: Patient will perform simple meal prep with assistance, with/without cues (OT). Flowsheets (Taken 10/30/2023 1225) LTG: Pt will perform simple meal prep with assistance level of: Independent with assistive device LTG: Pt will perform simple meal prep w/level of: Wheelchair level   Problem: RH Toilet Transfers Goal: LTG Patient will perform toilet transfers w/assist (OT) Description: LTG: Patient will perform toilet transfers with assist, with/without cues using equipment (OT) Flowsheets (Taken 10/30/2023 1225) LTG: Pt will perform toilet transfers with assistance level of: Independent with assistive device   Problem: RH Tub/Shower Transfers Goal: LTG Patient will perform tub/shower transfers w/assist (OT) Description: LTG: Patient will perform tub/shower transfers with assist, with/without cues using equipment (OT) Flowsheets (Taken 10/30/2023 1225) LTG: Pt will perform tub/shower stall transfers with assistance level of: Independent with assistive device

## 2023-10-30 NOTE — Evaluation (Signed)
 Physical Therapy Assessment and Plan  Patient Details  Name: Kayla Schmitt MRN: [id number] Date of Birth: 01-Apr-1966  PT Diagnosis: Abnormal posture, Abnormality of gait, Coordination disorder, Difficulty walking, Edema, Muscle weakness, and Pain in L residual limb Rehab Potential: Good ELOS: 10-12 days   Today's Date: 10/30/2023 PT Individual Time: 9168-9058 PT Individual Time Calculation (min): 70 min    Hospital Problem: Principal Problem:   S/P BKA (below knee amputation) unilateral, left (HCC) Active Problems:   Unilateral complete BKA, left, sequela (HCC)   Past Medical History:  Past Medical History:  Diagnosis Date   Anemia    Aortic stenosis    Arthritis    CHF (congestive heart failure) (HCC)    Coronary artery disease    Diabetes mellitus without complication (HCC)    type 2   GERD (gastroesophageal reflux disease)    Heart murmur    History of blood transfusion    Hypercholesteremia    Hypertension    Hypothyroidism    Mitral regurgitation    Myocardial infarction (HCC)    Peripheral vascular disease (HCC)    PONV (postoperative nausea and vomiting)    Thyroid disease    Past Surgical History:  Past Surgical History:  Procedure Laterality Date   ABDOMINAL AORTOGRAM W/LOWER EXTREMITY Bilateral 01/14/2020   Procedure: ABDOMINAL AORTOGRAM W/LOWER EXTREMITY;  Surgeon: Gretta Lonni PARAS, MD;  Location: [facility];  Service: Cardiovascular;  Laterality: Bilateral;   ABDOMINAL AORTOGRAM W/LOWER EXTREMITY N/A 07/05/2022   Procedure: ABDOMINAL AORTOGRAM W/LOWER EXTREMITY;  Surgeon: Gretta Lonni PARAS, MD;  Location: [facility];  Service: Cardiovascular;  Laterality: N/A;   ABDOMINAL AORTOGRAM W/LOWER EXTREMITY N/A 08/02/2022   Procedure: ABDOMINAL AORTOGRAM W/LOWER EXTREMITY;  Surgeon: Gretta Lonni PARAS, MD;  Location: [facility];  Service: Cardiovascular;  Laterality: N/A;   ABDOMINAL AORTOGRAM W/LOWER EXTREMITY N/A 08/06/2022    Procedure: ABDOMINAL AORTOGRAM W/LOWER EXTREMITY;  Surgeon: Gretta Lonni PARAS, MD;  [facility]: [facility];  Service: Cardiovascular;  Laterality: N/A;   AMPUTATION Bilateral 07/20/2022   Procedure: AMPUTATION RIGHT GREAT TOE AND SECOND TOE, AMPUTATION LEFT GREAT TOE;  Surgeon: Harden Jerona GAILS, MD;  Location: [facility] OR;  Service: Orthopedics;  Laterality: Bilateral;   AMPUTATION Bilateral 08/08/2022   Procedure: BILATERAL TRANSMETATARSAL AMPUTATION;  Surgeon: Harden Jerona GAILS, MD;  Location: [facility] OR;  Service: Orthopedics;  Laterality: Bilateral;   AMPUTATION Left 10/22/2023   Procedure: AMPUTATION BELOW KNEE;  Surgeon: Lanis Fonda BRAVO, MD;  Location: [facility] OR;  Service: Vascular;  Laterality: Left;   AMPUTATION TOE     BIOPSY  04/06/2023   Procedure: BIOPSY;  Surgeon: Mireya Elspeth SQUIBB, MD;  Location: [facility] ENDOSCOPY;  Service: Gastroenterology;;   COLONOSCOPY WITH PROPOFOL  N/A 04/06/2023   Procedure: COLONOSCOPY WITH PROPOFOL ;  Surgeon: Milagro Elspeth SQUIBB, MD;  Location: [facility] ENDOSCOPY;  Service: Gastroenterology;  Laterality: N/A;   ESOPHAGOGASTRODUODENOSCOPY (EGD) WITH PROPOFOL  N/A 04/06/2023   Procedure: ESOPHAGOGASTRODUODENOSCOPY (EGD) WITH PROPOFOL ;  Surgeon: Bellamie Elspeth SQUIBB, MD;  Location: [facility];  Service: Gastroenterology;  Laterality: N/A;   GIVENS CAPSULE STUDY N/A 04/06/2023   Procedure: GIVENS CAPSULE STUDY;  Surgeon: Eneida Elspeth SQUIBB, MD;  Location: [facility] ENDOSCOPY;  Service: Gastroenterology;  Laterality: N/A;   PERIPHERAL VASCULAR ATHERECTOMY Right 01/14/2020   Procedure: PERIPHERAL VASCULAR ATHERECTOMY;  Surgeon: Gretta Lonni PARAS, MD;  Location: [facility] INVASIVE CV LAB;  Service: Cardiovascular;  Laterality: Right;  sfa   PERIPHERAL VASCULAR INTERVENTION Right 01/14/2020   Procedure: PERIPHERAL VASCULAR INTERVENTION;  Surgeon: Gretta Lonni PARAS, MD;  Location: [facility] INVASIVE CV LAB;  Service: Cardiovascular;  Laterality: Right;  sfa stent    PERIPHERAL VASCULAR INTERVENTION   07/05/2022   Procedure: PERIPHERAL VASCULAR INTERVENTION;  Surgeon: Gretta Lonni PARAS, MD;  [facility];  Service: Cardiovascular;;   PERIPHERAL VASCULAR INTERVENTION  08/02/2022   Procedure: PERIPHERAL VASCULAR INTERVENTION;  Surgeon: Gretta Lonni PARAS, MD;  [facility]: [facility];  Service: Cardiovascular;;   PERIPHERAL VASCULAR INTERVENTION  08/06/2022   Procedure: PERIPHERAL VASCULAR INTERVENTION;  Surgeon: Gretta Lonni PARAS, MD;  Location: [facility] INVASIVE CV LAB;  Service: Cardiovascular;;   PERIPHERAL VASCULAR THROMBECTOMY  08/02/2022   Procedure: PERIPHERAL VASCULAR THROMBECTOMY;  Surgeon: Gretta Lonni PARAS, MD;  [facility]: [facility];  Service: Cardiovascular;;   REVISION AMPUTATION, BELOW THE KNEE Left 10/23/2023   Procedure: REVISION AMPUTATION, BELOW THE KNEE;  Surgeon: Harden Jerona GAILS, MD;  Location: [facility] OR;  Service: Orthopedics;  Laterality: Left;  REVISION LEFT BELOW KNEE AMPUTATION   RIGHT/LEFT HEART CATH AND CORONARY ANGIOGRAPHY N/A 08/23/2022   Procedure: RIGHT/LEFT HEART CATH AND CORONARY ANGIOGRAPHY;  Surgeon: Dann Candyce RAMAN, MD;  Location: [facility] INVASIVE CV LAB;  Service: Cardiovascular;  Laterality: N/A;   SKIN SPLIT GRAFT Right 03/18/2020   Procedure: SKIN GRAFTING RIGHT FOOT ULCER;  Surgeon: Harden Jerona GAILS, MD;  Location: [facility] OR;  Service: Orthopedics;  Laterality: Right;   STUMP REVISION Right 11/30/2022   Procedure: REVISION RIGHT TRANSMETATARSAL AMPUTATION;  Surgeon: Harden Jerona GAILS, MD;  Location: [facility] OR;  Service: Orthopedics;  Laterality: Right;   TEE WITHOUT CARDIOVERSION N/A 08/27/2022   Procedure: TRANSESOPHAGEAL ECHOCARDIOGRAM;  Surgeon: Hobart Powell BRAVO, MD;  Location: [facility] INVASIVE CV LAB;  Service: Cardiovascular;  Laterality: N/A;   TRANSESOPHAGEAL ECHOCARDIOGRAM (CATH LAB) N/A 10/24/2023   Procedure: TRANSESOPHAGEAL ECHOCARDIOGRAM;  Surgeon: Michele Richardson, DO;  Location: [facility];  Service: Cardiovascular;  Laterality: N/A;    WISDOM TOOTH EXTRACTION      Assessment & Plan Clinical Impression: Patient is a 58 y.o. year old female with history of T2DM with neuropathy, GERD, CAD with combined CHF and aortic stenosis, GIB due to AVM, tobacco use in the past (quit 6-7 years ago), RLS, PAD s/p transmetatarsal amputation bilateral feet last year with non healing wound on right who admitted to [facility] with lethargy, fever , AKI and hypotension due to sepsis from MRSA bacteremia and found to have purple area of discoloration on left leg with blistering. She was found to have lactic acidosis with elevated troponin, anemia requiring one unit PRBC and no clear source of infection found. She was transferred to [facility] for management and MRI LLE showed diffuse edema anterior and posterior compartment of left shin/calf extending from knee thorough ankle as well as deep intramuscular fascia as well as deep to medial gastroc muscle question cellulitis or fasciitis and blistering extending along left posteromedial ankle.  ID recommended  continuing broad spectrum antibiotics through surgery. Exam concerning for necrotizing fasciitis and she underwent L-BKA with open flab wrapped with wound VAC on 06/24 by Dr. Silver followed by revision BKA on 06/25 by Dr. Harden.      TEE done 06/26 revealing normal LVEF 60-65% which was negative for thrombus or endocarditis, negative for shunting, showed  moderate to severe aortic stenosis w/mild to moderate AR, mild to moderate MR and mild layered plaque involving descending aorta. Antibiotics narrowed to IV Daptomycin  thru 11/17/23 --4 weeks from negative [facility] 06/22.  Mentation has improved with  improvement in pain but continues to have phantom pain also. PT/OT consulted and has been working with patient who requires mod assist X 2 for stand pivot transfers and +2 mod assist with ADLs. She lives alone in [place], was independent and working PTA. CIR recommended due to functional decline.   Patient currently  requires min with mobility secondary to muscle weakness and muscle joint tightness, decreased cardiorespiratoy endurance, and decreased standing balance, decreased postural control, decreased balance strategies, and difficulty maintaining precautions.  Prior to hospitalization, patient was modified independent  with mobility and lived with Alone in a House home.  Home access is  Ramped entrance.  Patient will benefit from skilled PT intervention to maximize safe functional mobility, minimize fall risk, and decrease caregiver burden for planned discharge home with 24 hour supervision.  Anticipate patient will benefit from follow up OP at discharge.  PT - End of Session Activity Tolerance: Tolerates 30+ min activity with multiple rests Endurance Deficit: Yes Endurance Deficit Description: frequent rest breaks needed with minimal activity PT Assessment Rehab Potential (ACUTE/IP ONLY): Good PT Barriers to Discharge: Decreased caregiver support;Wound Care;Weight;Weight bearing restrictions PT Barriers to Discharge Comments: pain, wound vac, condition of contralateral limb, decreased balance, lives alone PT Patient demonstrates impairments in the following area(s): Balance;Edema;Endurance;Nutrition;Pain;Skin Integrity PT Transfers Functional Problem(s): Bed Mobility;Bed to Chair;Car;Furniture PT Locomotion Functional Problem(s): Ambulation;Wheelchair Mobility;Stairs PT Plan PT Intensity: Minimum of 1-2 x/day ,45 to 90 minutes PT Frequency: 5 out of 7 days PT Duration Estimated Length of Stay: 10-12 days PT Treatment/Interventions: Ambulation/gait training;Discharge planning;Functional mobility training;Psychosocial support;Therapeutic Activities;Balance/vestibular training;Disease management/prevention;Neuromuscular re-education;Skin care/wound management;Therapeutic Exercise;Wheelchair propulsion/positioning;DME/adaptive equipment instruction;Pain management;Splinting/orthotics;UE/LE Strength  taining/ROM;Community reintegration;Functional electrical stimulation;Patient/family education;Stair training;UE/LE Coordination activities PT Transfers Anticipated Outcome(s): Mod I with LRAD PT Locomotion Anticipated Outcome(s): supervision with LRAD PT Recommendation Follow Up Recommendations: Outpatient PT Patient destination: Home Equipment Recommended: To be determined Equipment Details: has WC, SPC, and rollator   PT Evaluation Precautions/Restrictions Precautions Precautions: Fall Precaution/Restrictions Comments: L BKA with wound vac Restrictions Weight Bearing Restrictions Per Provider Order: Yes LLE Weight Bearing Per Provider Order: Non weight bearing Pain Interference Pain Interference Pain Effect on Sleep: 2. Occasionally Pain Interference with Therapy Activities: 1. Rarely or not at all Pain Interference with Day-to-Day Activities: 1. Rarely or not at all Home Living/Prior Functioning Home Living Living Arrangements: Alone Available Help at Discharge: Family;Available 24 hours/day (mom lives across the street) Type of Home: House Home Access: Ramped entrance Home Layout: One level Bathroom Shower/Tub: Walk-in shower;Curtain (6 in lip) Bathroom Toilet: Handicapped height Bathroom Accessibility: Yes Additional Comments: Has 2 manual w/c's (one was her dads, and thrifted one), shower chair, rollator, SPC. 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 Vision/Perception  Vision - History Ability to See in Adequate Light: 0 Adequate Vision - Assessment Additional Comments: wears contacts Perception Perception: Within Functional Limits Praxis Praxis: WFL   Cognition Overall Cognitive Status: Within Functional Limits for tasks assessed Arousal/Alertness: Awake/alert Orientation Level: Oriented X4 Memory: Appears intact Awareness: Appears intact Problem Solving: Appears intact Safety/Judgment: Appears intact Sensation Sensation Light Touch: Appears Intact Hot/Cold: Not tested Proprioception: Appears Intact Stereognosis: Not tested Additional Comments: pt reports phantom pain in L residual limb. 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 Coordination Gross  Motor Movements are Fluid and Coordinated: No Fine Motor Movements are Fluid and Coordinated: Yes Coordination and Movement Description: altered balance strategies due to L BKA, weakness/deconditioning, pain, decreased balance/coordination Finger Nose Finger Test: [facility] bilaterally Heel Shin Test: unable to perform on LLE due to BKA Motor  Motor Motor: Abnormal postural alignment and control Motor - Skilled Clinical Observations: altered balance strategies due to L BKA, weakness/deconditioning, pain, decreased balance/coordination  Trunk/Postural Assessment  Cervical Assessment Cervical Assessment: Within Functional Limits Thoracic Assessment Thoracic Assessment: Within Functional Limits Lumbar Assessment Lumbar Assessment: Exceptions to [facility] (posterior pelvic tilt) Postural Control Postural Control: Deficits on evaluation Righting Reactions: delayed on L Protective Responses: delayed on L  Balance Balance Balance Assessed: Yes Static Sitting Balance Static Sitting - Balance Support: Feet supported;Bilateral upper extremity supported Static Sitting - Level of Assistance: 6: Modified independent (Device/Increase time) Dynamic Sitting Balance Dynamic Sitting - Balance Support: Feet supported;No upper extremity supported Dynamic Sitting - Level of Assistance: 5: Stand by assistance (supervision) Static Standing Balance Static Standing - Balance Support: Bilateral upper  extremity supported;During functional activity (RW) Static Standing - Level of Assistance: 4: Min assist Extremity Assessment  RLE Assessment RLE Assessment: Exceptions to [facility] General Strength Comments: tested sitting EOB RLE Strength Right Hip Flexion: 4-/5 Right Hip ABduction: 4-/5 Right Hip ADduction: 4-/5 Right Knee Flexion: 4-/5 Right Knee Extension: 4-/5 LLE Assessment LLE Assessment: Exceptions to [facility] General Strength Comments: tested sitting EOB - limited by pain LLE Strength Left Hip Flexion: 3+/5 Left Hip ABduction: 3+/5 Left Hip ADduction: 3+/5 Left Knee Flexion: 3/5 Left Knee Extension: 3/5  Care Tool Care Tool Bed Mobility Roll left and right activity   Roll left and right assist level: Supervision/Verbal cueing    Sit to lying activity        Lying to sitting on side of bed activity   Lying to sitting on side of bed assist level: the ability to move from lying on the back to sitting on the side of the bed with no back support.: Supervision/Verbal cueing     Care Tool Transfers Sit to stand transfer   Sit to stand assist level: Minimal Assistance - Patient > 75%    Chair/bed transfer   Chair/bed transfer assist level: Minimal Assistance - Patient > 75%    Car transfer Car transfer activity did not occur: Safety/medical concerns (pain, decreased balance)        Care Tool Locomotion Ambulation Ambulation activity did not occur: Safety/medical concerns (pain, decreased balance, wound on R heel)        Walk 10 feet activity Walk 10 feet activity did not occur: Safety/medical concerns (pain, decreased balance, wound on R heel)       Walk 50 feet with 2 turns activity Walk 50 feet with 2 turns activity did not occur: Safety/medical concerns (pain, decreased balance, wound on R heel)      Walk 150 feet activity Walk 150 feet activity did not occur: Safety/medical concerns (pain, decreased balance, wound on R heel)      Walk 10 feet on uneven surfaces  activity Walk 10 feet on uneven surfaces activity did not occur: Safety/medical concerns (pain, decreased balance, wound on R heel)      Stairs Stair activity did not occur: Safety/medical concerns (pain, decreased balance, wound on R heel)        Walk up/down 1 step activity Walk up/down 1 step or curb (drop down) activity did not occur: Safety/medical concerns (pain, decreased balance, wound on  R heel)      Walk up/down 4 steps activity Walk up/down 4 steps activity did not occur: Safety/medical concerns (pain, decreased balance, wound on R heel)      Walk up/down 12 steps activity Walk up/down 12 steps activity did not occur: Safety/medical concerns (pain, decreased balance, wound on R heel)      Pick up small objects from floor Pick up small object from the floor (from standing position) activity did not occur: Safety/medical concerns (pain, decreased balance, wound on R heel)      Wheelchair Is the patient using a wheelchair?: Yes Type of Wheelchair: Manual   Wheelchair assist level: Supervision/Verbal cueing Max wheelchair distance: 173ft  Wheel 50 feet with 2 turns activity   Assist Level: Supervision/Verbal cueing  Wheel 150 feet activity   Assist Level: Supervision/Verbal cueing    Refer to Care Plan for Long Term Goals  SHORT TERM GOAL WEEK 1 PT Short Term Goal 1 (Week 1): pt will transfer bed<>chair with LRAD and CGA PT Short Term Goal 2 (Week 1): pt will transfer sit<>stand with LRAD and CGA PT Short Term Goal 3 (Week 1): pt will perform simulated car transfer with LRAD and CGA  Recommendations for other services: None   Skilled Therapeutic Intervention Evaluation completed (see details above and below) with education on PT POC and goals and individual treatment initiated with focus on functional mobility/transfers, generalized strengthening and endurance, dynamic standing balance/coordination, and limb loss education. Received pt semi-reclined in bed, pt educated  on PT evaluation, CIR policies, and therapy schedule and agreeable. Pt reported pain 4/10 in L residual limb (premedicated).  Provided pt with 20x18 manual WC, L amputee pad, and R standard legrest. Pt transferred semi-reclined<>sitting EOB with HOB elevated and use of bedrails with supervision. Donned 2nd gown with assist and donned R sock and shoe (with toe filler) with set up assist. Donned limb guard with max A but limb guard too long - need to contact [facility] for adjustments. CSW arrived briefly to chat with pt, then transferred bed<>WC via stand<>pivot without AD and min/mod A due to R ankle slipping out of shoe.   Pt performed WC mobility 171ft using BUE and supervision to dayroom with emphasis on UE strength. Stood x 2 trials with RW and min A x 2 trials and performed LLE hip flexion 2x10 - limb guard falling off during standing exercises and removed for remainder of session. Pt performed seated WC pushups 2x10, then transported back to room in [facility] dependently. Concluded session with pt sitting in [facility] with all needs within reach awaiting upcoming OT session.   Mobility Bed Mobility Bed Mobility: Rolling Left;Supine to Sit Rolling Left: Supervision/Verbal cueing Supine to Sit: Supervision/Verbal cueing Transfers Transfers: Sit to Stand;Stand to Sit;Squat Pivot Transfers Sit to Stand: Minimal Assistance - Patient > 75% Stand to Sit: Minimal Assistance - Patient > 75% Squat Pivot Transfers: Minimal Assistance - Patient > 75% Transfer (Assistive device): Rolling walker Locomotion  Gait Ambulation: No Gait Gait: No Stairs / Additional Locomotion Stairs: No Wheelchair Mobility Wheelchair Mobility: Yes Wheelchair Assistance: Doctor, general practice: Both upper extremities Wheelchair Parts Management: Needs assistance Distance: 18ft   Discharge Criteria: Patient will be discharged from PT if patient refuses treatment 3 consecutive times without medical reason, if  treatment goals not met, if there is a change in medical status, if patient makes no progress towards goals or if patient is discharged from hospital.  The above assessment, treatment plan, treatment alternatives and  goals were discussed and mutually agreed upon: by patient  Therisa CHRISTELLA Delorse Therisa Delorse PT, DPT 10/30/2023, 12:11 PM

## 2023-10-30 NOTE — Evaluation (Signed)
 Occupational Therapy Assessment and Plan  Patient Details  Name: Kayla Schmitt MRN: [id number] Date of Birth: [birth year]  OT Diagnosis: acute pain, muscle weakness (generalized), and swelling of limb Rehab Potential: Rehab Potential (ACUTE ONLY): Good ELOS: 10-12 days   Today's Date: 10/30/2023 OT Individual Time: 9054-8869 OT Individual Time Calculation (min): 105 min     Hospital Problem: Principal Problem:   S/P BKA (below knee amputation) unilateral, left (HCC) Active Problems:   Unilateral complete BKA, left, sequela (HCC)   Past Medical History:  Past Medical History:  Diagnosis Date   Anemia    Aortic stenosis    Arthritis    CHF (congestive heart failure) (HCC)    Coronary artery disease    Diabetes mellitus without complication (HCC)    type 2   GERD (gastroesophageal reflux disease)    Heart murmur    History of blood transfusion    Hypercholesteremia    Hypertension    Hypothyroidism    Mitral regurgitation    Myocardial infarction (HCC)    Peripheral vascular disease (HCC)    PONV (postoperative nausea and vomiting)    Thyroid disease    Past Surgical History:  Past Surgical History:  Procedure Laterality Date   ABDOMINAL AORTOGRAM W/LOWER EXTREMITY Bilateral 01/14/2020   Procedure: ABDOMINAL AORTOGRAM W/LOWER EXTREMITY;  Surgeon: Gretta Lonni PARAS, MD;  [facility]: [facility];  Service: Cardiovascular;  Laterality: Bilateral;   ABDOMINAL AORTOGRAM W/LOWER EXTREMITY N/A 07/05/2022   Procedure: ABDOMINAL AORTOGRAM W/LOWER EXTREMITY;  Surgeon: Gretta Lonni PARAS, MD;  Location: [facility];  Service: Cardiovascular;  Laterality: N/A;   ABDOMINAL AORTOGRAM W/LOWER EXTREMITY N/A 08/02/2022   Procedure: ABDOMINAL AORTOGRAM W/LOWER EXTREMITY;  Surgeon: Gretta Lonni PARAS, MD;  Location: [facility];  Service: Cardiovascular;  Laterality: N/A;   ABDOMINAL AORTOGRAM W/LOWER EXTREMITY N/A 08/06/2022   Procedure: ABDOMINAL AORTOGRAM  W/LOWER EXTREMITY;  Surgeon: Gretta Lonni PARAS, MD;  [facility]: [facility];  Service: Cardiovascular;  Laterality: N/A;   AMPUTATION Bilateral 07/20/2022   Procedure: AMPUTATION RIGHT GREAT TOE AND SECOND TOE, AMPUTATION LEFT GREAT TOE;  Surgeon: Harden Jerona GAILS, MD;  Location: [facility];  Service: Orthopedics;  Laterality: Bilateral;   AMPUTATION Bilateral 08/08/2022   Procedure: BILATERAL TRANSMETATARSAL AMPUTATION;  Surgeon: Harden Jerona GAILS, MD;  Location: [facility] OR;  Service: Orthopedics;  Laterality: Bilateral;   AMPUTATION Left 10/22/2023   Procedure: AMPUTATION BELOW KNEE;  Surgeon: Lanis Fonda BRAVO, MD;  Location: [facility] OR;  Service: Vascular;  Laterality: Left;   AMPUTATION TOE     BIOPSY  04/06/2023   Procedure: BIOPSY;  Surgeon: Daija Elspeth SQUIBB, MD;  Location: [facility] ENDOSCOPY;  Service: Gastroenterology;;   COLONOSCOPY WITH PROPOFOL  N/A 04/06/2023   Procedure: COLONOSCOPY WITH PROPOFOL ;  Surgeon: Jonquil Elspeth SQUIBB, MD;  Location: [facility] ENDOSCOPY;  Service: Gastroenterology;  Laterality: N/A;   ESOPHAGOGASTRODUODENOSCOPY (EGD) WITH PROPOFOL  N/A 04/06/2023   Procedure: ESOPHAGOGASTRODUODENOSCOPY (EGD) WITH PROPOFOL ;  Surgeon: Pluma Elspeth SQUIBB, MD;  Location: [facility] ENDOSCOPY;  Service: Gastroenterology;  Laterality: N/A;   GIVENS CAPSULE STUDY N/A 04/06/2023   Procedure: GIVENS CAPSULE STUDY;  Surgeon: Magenta Elspeth SQUIBB, MD;  Location: [facility] ENDOSCOPY;  Service: Gastroenterology;  Laterality: N/A;   PERIPHERAL VASCULAR ATHERECTOMY Right 01/14/2020   Procedure: PERIPHERAL VASCULAR ATHERECTOMY;  Surgeon: Gretta Lonni PARAS, MD;  Location: [facility] INVASIVE CV LAB;  Service: Cardiovascular;  Laterality: Right;  sfa   PERIPHERAL VASCULAR INTERVENTION Right 01/14/2020   Procedure: PERIPHERAL VASCULAR INTERVENTION;  Surgeon: Gretta Lonni  J, MD;  [facility]: [facility];  Service: Cardiovascular;  Laterality: Right;  sfa stent    PERIPHERAL VASCULAR INTERVENTION  07/05/2022   Procedure: PERIPHERAL  VASCULAR INTERVENTION;  Surgeon: Gretta Lonni PARAS, MD;  [facility];  Service: Cardiovascular;;   PERIPHERAL VASCULAR INTERVENTION  08/02/2022   Procedure: PERIPHERAL VASCULAR INTERVENTION;  Surgeon: Gretta Lonni PARAS, MD;  [facility];  Service: Cardiovascular;;   PERIPHERAL VASCULAR INTERVENTION  08/06/2022   Procedure: PERIPHERAL VASCULAR INTERVENTION;  Surgeon: Gretta Lonni PARAS, MD;  Location: [facility] INVASIVE CV LAB;  Service: Cardiovascular;;   PERIPHERAL VASCULAR THROMBECTOMY  08/02/2022   Procedure: PERIPHERAL VASCULAR THROMBECTOMY;  Surgeon: Gretta Lonni PARAS, MD;  [facility];  Service: Cardiovascular;;   REVISION AMPUTATION, BELOW THE KNEE Left 10/23/2023   Procedure: REVISION AMPUTATION, BELOW THE KNEE;  Surgeon: Harden Jerona GAILS, MD;  Location: [facility] OR;  Service: Orthopedics;  Laterality: Left;  REVISION LEFT BELOW KNEE AMPUTATION   RIGHT/LEFT HEART CATH AND CORONARY ANGIOGRAPHY N/A 08/23/2022   Procedure: RIGHT/LEFT HEART CATH AND CORONARY ANGIOGRAPHY;  Surgeon: Dann Candyce RAMAN, MD;  Location: [facility] INVASIVE CV LAB;  Service: Cardiovascular;  Laterality: N/A;   SKIN SPLIT GRAFT Right 03/18/2020   Procedure: SKIN GRAFTING RIGHT FOOT ULCER;  Surgeon: Harden Jerona GAILS, MD;  Location: [facility] OR;  Service: Orthopedics;  Laterality: Right;   STUMP REVISION Right 11/30/2022   Procedure: REVISION RIGHT TRANSMETATARSAL AMPUTATION;  Surgeon: Harden Jerona GAILS, MD;  Location: [facility] OR;  Service: Orthopedics;  Laterality: Right;   TEE WITHOUT CARDIOVERSION N/A 08/27/2022   Procedure: TRANSESOPHAGEAL ECHOCARDIOGRAM;  Surgeon: Hobart Powell BRAVO, MD;  Location: [facility] INVASIVE CV LAB;  Service: Cardiovascular;  Laterality: N/A;   TRANSESOPHAGEAL ECHOCARDIOGRAM (CATH LAB) N/A 10/24/2023   Procedure: TRANSESOPHAGEAL ECHOCARDIOGRAM;  Surgeon: Michele Richardson, DO;  [facility]: [facility];  Service: Cardiovascular;  Laterality: N/A;   WISDOM TOOTH EXTRACTION       Assessment & Plan Clinical Impression: Kayla Schmitt is a 58 year old female with history of T2DM with neuropathy, GERD, CAD with combined CHF and aortic stenosis, GIB due to AVM, tobacco use in the past (quit 6-7 years ago), RLS, PAD s/p transmetatarsal amputation bilateral feet last year with non healing wound on right who admitted to [facility] with lethargy, fever , AKI and hypotension due to sepsis from MRSA bacteremia and found to have purple area of discoloration on left leg with blistering. She was found to have lactic acidosis with elevated troponin, anemia requiring one unit PRBC and no clear source of infection found. She was [facility] for management and MRI LLE showed diffuse edema anterior and posterior compartment of left shin/calf extending from knee thorough ankle as well as deep intramuscular fascia as well as deep to medial gastroc muscle question cellulitis or fasciitis and blistering extending along left posteromedial ankle.  ID recommended  continuing broad spectrum antibiotics through surgery. Exam concerning for necrotizing fasciitis and she underwent L-BKA with open flab wrapped with wound VAC on 06/24 by Dr. Silver followed by revision BKA on 06/25 by Dr. Harden.      TEE done 06/26 revealing normal LVEF 60-65% which was negative for thrombus or endocarditis, negative for shunting, showed  moderate to severe aortic stenosis w/mild to moderate AR, mild to moderate MR and mild layered plaque involving descending aorta. Antibiotics narrowed to IV Daptomycin  thru 11/17/23 --4 weeks from negative [facility] 06/22.  Mentation has improved with improvement in  pain but continues to have phantom pain also. PT/OT consulted and has been working with patient who requires mod assist X 2 for stand pivot transfers and +2 mod assist with ADLs. She lives alone in [place], was independent and working PTA. CIR recommended due to functional decline. Patient transferred to CIR on 10/29/2023 .     Patient currently requires mod-max A with basic self-care skills secondary to muscle weakness, decreased cardiorespiratoy endurance, decreased coordination, and decreased standing balance and difficulty maintaining precautions.  Prior to hospitalization, patient could complete self-care with mod I.  Patient will benefit from skilled intervention to decrease level of assist with basic self-care skills, increase independence with basic self-care skills, and increase level of independence with iADL prior to discharge home independently.  Anticipate patient will require intermittent supervision and follow up home health.  OT - End of Session Activity Tolerance: Tolerates < 10 min activity, no significant change in vital signs Endurance Deficit: Yes Endurance Deficit Description: frequent rest breaks needed OT Assessment Rehab Potential (ACUTE ONLY): Good OT Barriers to Discharge: Wound Care;Weight bearing restrictions;IV antibiotics OT Patient demonstrates impairments in the following area(s): Balance;Edema;Endurance;Motor;Pain OT Basic ADL's Functional Problem(s): Bathing;Dressing;Toileting OT Advanced ADL's Functional Problem(s): Simple Meal Preparation OT Transfers Functional Problem(s): Toilet;Tub/Shower OT Additional Impairment(s): None OT Plan OT Intensity: Minimum of 1-2 x/day, 45 to 90 minutes OT Frequency: 5 out of 7 days OT Duration/Estimated Length of Stay: 10-12 days OT Treatment/Interventions: Balance/vestibular training;Discharge planning;Pain management;Self Care/advanced ADL retraining;Therapeutic Activities;UE/LE Coordination activities;Disease mangement/prevention;Functional mobility training;Patient/family education;Skin care/wound managment;Therapeutic Exercise;DME/adaptive equipment instruction;Neuromuscular re-education;UE/LE Strength taining/ROM;Wheelchair propulsion/positioning OT Self Feeding Anticipated Outcome(s): Independent OT Basic Self-Care Anticipated Outcome(s):  Mod I OT Toileting Anticipated Outcome(s): Mod I OT Bathroom Transfers Anticipated Outcome(s): Mod I OT Recommendation Recommendations for Other Services: Therapeutic Recreation consult Therapeutic Recreation Interventions: Pet therapy;Stress management Patient destination: Home Follow Up Recommendations: Home health OT Equipment Recommended: To be determined   OT Evaluation Precautions/Restrictions  Precautions Precautions: Fall Precaution/Restrictions Comments: L BKA with wound vac Restrictions Weight Bearing Restrictions Per Provider Order: Yes LLE Weight Bearing Per Provider Order: Non weight bearing Home Living/Prior Functioning Home Living Family/patient expects to be discharged to:: Private residence Living Arrangements: Alone Available Help at Discharge: Family, Available 24 hours/day (mom lives across the street) Type of Home: House Home Access: Ramped entrance Home Layout: One level Bathroom Shower/Tub: Walk-in shower, Curtain (6 in lip) Bathroom Toilet: Handicapped height Bathroom Accessibility: Yes Additional Comments: Has 2 manual w/c's (one was her dads, and thrifted one), shower chair, rollator, SPC. 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 Vision Baseline Vision/History: 0 No visual deficits Ability to See in Adequate Light: 0 Adequate Patient Visual Report: No change from baseline Vision Assessment?: No apparent visual deficits Additional Comments: wears contacts Perception  Perception:  Within Functional Limits Praxis Praxis: WFL Cognition Cognition Overall Cognitive Status: Within Functional Limits for tasks assessed Arousal/Alertness: Awake/alert Orientation Level: Place;Person;Situation Person: Oriented Place: Oriented Situation: Oriented Memory: Appears intact Awareness: Appears intact Problem Solving: Appears intact Safety/Judgment: Appears intact Brief Interview for Mental Status (BIMS) Repetition of Three Words (First Attempt): 3 Temporal Orientation: Year:  Correct Temporal Orientation: Month: Accurate within 5 days Temporal Orientation: Day: Correct Recall: Sock: Yes, no cue required Recall: Blue: Yes, no cue required Recall: Bed: Yes, no cue required BIMS Summary Score: 15 Sensation Sensation Light Touch: Appears Intact Hot/Cold: Not tested Proprioception: Appears Intact Stereognosis: Not tested Additional Comments: pt reports phantom pain in L residual limb. R transmet amputation Coordination Gross Motor Movements are Fluid and Coordinated: No Fine Motor Movements are Fluid and Coordinated: Yes Finger Nose Finger Test: WFL bilaterally Heel Shin Test: unable to perform on LLE due to BKA Motor  Motor Motor: Abnormal postural alignment and control Motor - Skilled Clinical Observations: altered balance strategies due to L BKA, weakness/deconditioning, pain, decreased balance/coordination  Trunk/Postural Assessment  Cervical Assessment Cervical Assessment: Within Functional Limits Thoracic Assessment Thoracic Assessment: Within Functional Limits Lumbar Assessment Lumbar Assessment: Exceptions to [facility] (posterior pelvic tilt) Postural Control Postural Control: Deficits on evaluation Righting Reactions: delayed on L Protective Responses: delayed on L  Balance Balance Balance Assessed: Yes Static Sitting Balance Static Sitting - Balance Support: Feet supported;Bilateral upper extremity supported Static Sitting - Level of Assistance: 6:  Modified independent (Device/Increase time) Dynamic Sitting Balance Dynamic Sitting - Balance Support: Feet supported;No upper extremity supported Dynamic Sitting - Level of Assistance: 5: Stand by assistance (supervision) Static Standing Balance Static Standing - Balance Support: Bilateral upper extremity supported;During functional activity (RW) Static Standing - Level of Assistance: 4: Min assist Extremity/Trunk Assessment RUE Assessment RUE Assessment: Within Functional Limits LUE Assessment LUE Assessment: Within Functional Limits  Care Tool Care Tool Self Care Eating   Eating Assist Level: Set up assist    Oral Care    Oral Care Assist Level: Set up assist    Bathing   Body parts bathed by patient: Right arm;Left arm;Chest;Abdomen;Right upper leg;Left upper leg;Face Body parts bathed by helper: Right lower leg;Front perineal area;Buttocks Body parts n/a: Left lower leg Assist Level: Moderate Assistance - Patient 50 - 74%    Upper Body Dressing(including orthotics)   What is the patient wearing?: Pull over shirt   Assist Level: Minimal Assistance - Patient > 75%    Lower Body Dressing (excluding footwear)   What is the patient wearing?: Underwear/pull up;Pants Assist for lower body dressing: Maximal Assistance - Patient 25 - 49%    Putting on/Taking off footwear   What is the patient wearing?: Shoes Assist for footwear: Dependent - Patient 0%       Care Tool Toileting Toileting activity   Assist for toileting: Maximal Assistance - Patient 25 - 49%     Care Tool Bed Mobility Roll left and right activity   Roll left and right assist level: Supervision/Verbal cueing    Sit to lying activity        Lying to sitting on side of bed activity   Lying to sitting on side of bed assist level: the ability to move from lying on the back to sitting on the side of the bed with no back support.: Supervision/Verbal cueing     Care Tool Transfers Sit to stand transfer    Sit to stand assist level: Minimal Assistance - Patient > 75%    Chair/bed transfer   Chair/bed transfer assist level: Minimal Assistance - Patient > 75%     Toilet transfer   Assist Level: Minimal Assistance - Patient > 75%     Care Tool Cognition  Expression of Ideas and Wants Expression of Ideas and Wants: 4. Without difficulty (complex and basic) - expresses complex  messages without difficulty and with speech that is clear and easy to understand  Understanding Verbal and Non-Verbal Content Understanding Verbal and Non-Verbal Content: 4. Understands (complex and basic) - clear comprehension without cues or repetitions   Memory/Recall Ability     Refer to Care Plan for Long Term Goals  SHORT TERM GOAL WEEK 1 OT Short Term Goal 1 (Week 1): Pt will complete 2/3 toileting steps with CGA for balance OT Short Term Goal 2 (Week 1): Pt will complete sit > stand in prep for ADL with CGA OT Short Term Goal 3 (Week 1): Pt will complete toilet transfer with CGA  Recommendations for other services: Therapeutic Recreation  Pet therapy and Stress management   Skilled Therapeutic Intervention Patient received seated in w/c upon therapy arrival and agreeable to participate in OT evaluation. Education provided on OT purpose, therapy schedule, goals for therapy, and safety policy while in rehab. Unrated pain reported in L BKA; nurse issued meds during session. OT offered rest breaks, repositioning for pain reduction.  Completed sit > stands with min A using RW, min A squat pivot from w/c > DABSC, and Max A stand pivot with RW from [facility] > w/c during session with max cues for hand placement and body positioning for safety and fall prevention. RLE with weakness after duration of stance for max A toileting pericare, and pt did have R knee buckle with R ankle deviation requiring heavier assist to lower pt to w/c. Practiced sit > stand in stedy with CGA/min A needed and advised nursing to utilize for  transfers at current time for fall prevention. Pt completed UB bathing set up A, UB dressing min A for IV management, LB dressing with max A at the sit > stand level. Pt with continent urinary void and BM during session with stool collected in hat for nursing and nursing notified.  Overall pt demonstrates dynamic standing, gross motor coordination, and functional endurance deficits resulting in difficulty completing BADL tasks without increased physical assist. See below for further ADL and functional transfer performance. Pt will benefit from skilled OT services to focus on mentioned deficits.  Education provided to pt about L BKA positioning to maintain NWB status even with limb support pad in w/c as pt received with base of limb pressing into support pad and unaware. Discussed home set up with mother present and suggested potential installation of grab bar on wall of toilet for accessibility with toilet transfers.  Pt remained seated in w/c at conclusion of session with chair alarm on and all needs met at end of session.      ADL ADL Eating: Set up Where Assessed-Eating: Wheelchair Grooming: Setup Where Assessed-Grooming: Sitting at sink Upper Body Bathing: Setup Where Assessed-Upper Body Bathing: Sitting at sink Lower Body Bathing: Moderate assistance Where Assessed-Lower Body Bathing: Sitting at sink;Standing at sink Upper Body Dressing: Minimal assistance Where Assessed-Upper Body Dressing: Wheelchair Lower Body Dressing: Maximal assistance Where Assessed-Lower Body Dressing: Sitting at sink;Standing at sink Toileting: Maximal assistance Where Assessed-Toileting: Bedside Commode Toilet Transfer: Minimal assistance Toilet Transfer Method: Squat pivot Toilet Transfer Equipment: Drop arm bedside commode Tub/Shower Transfer: Unable to assess Tub/Shower Transfer Method: Unable to assess Film/video editor: Unable to assess Film/video editor Method: Unable to  assess Mobility  Bed Mobility Bed Mobility: Rolling Left;Supine to Sit Rolling Left: Supervision/Verbal cueing Supine to Sit: Supervision/Verbal cueing Transfers Sit to Stand: Minimal Assistance - Patient > 75% Stand to Sit: Minimal Assistance - Patient > 75%   Discharge  Criteria: Patient will be discharged from OT if patient refuses treatment 3 consecutive times without medical reason, if treatment goals not met, if there is a change in medical status, if patient makes no progress towards goals or if patient is discharged from hospital.  The above assessment, treatment plan, treatment alternatives and goals were discussed and mutually agreed upon: by patient  Lorrayne FORBES Fritter, MS, OTR/L  10/30/2023, 11:58 AM

## 2023-10-31 DIAGNOSIS — Z89512 Acquired absence of left leg below knee: Secondary | ICD-10-CM | POA: Diagnosis not present

## 2023-10-31 LAB — CBC
HCT: 22.6 % — ABNORMAL LOW (ref 36.0–46.0)
Hemoglobin: 6.8 g/dL — CL (ref 12.0–15.0)
MCH: 29.1 pg (ref 26.0–34.0)
MCHC: 30.1 g/dL (ref 30.0–36.0)
MCV: 96.6 fL (ref 80.0–100.0)
Platelets: 227 10*3/uL (ref 150–400)
RBC: 2.34 MIL/uL — ABNORMAL LOW (ref 3.87–5.11)
RDW: 15.6 % — ABNORMAL HIGH (ref 11.5–15.5)
WBC: 5.3 10*3/uL (ref 4.0–10.5)
nRBC: 0 % (ref 0.0–0.2)

## 2023-10-31 LAB — VITAMIN D 25 HYDROXY (VIT D DEFICIENCY, FRACTURES): Vit D, 25-Hydroxy: 50.6 ng/mL (ref 30–100)

## 2023-10-31 LAB — GLUCOSE, CAPILLARY
Glucose-Capillary: 103 mg/dL — ABNORMAL HIGH (ref 70–99)
Glucose-Capillary: 115 mg/dL — ABNORMAL HIGH (ref 70–99)
Glucose-Capillary: 114 mg/dL — ABNORMAL HIGH (ref 70–99)
Glucose-Capillary: 143 mg/dL — ABNORMAL HIGH (ref 70–99)

## 2023-10-31 LAB — PREPARE RBC (CROSSMATCH)

## 2023-10-31 MED ORDER — SODIUM CHLORIDE 0.9% IV SOLUTION: Freq: Once | INTRAVENOUS | Status: DC

## 2023-10-31 NOTE — Progress Notes (Signed)
 Orthopedic Tech Progress Note Patient Details:  Kayla Schmitt [birth year] [id number] Called in order to [facility] for [facility] Patient ID: Kayla Schmitt, female   DOB: Dec 31, 1965, 58 y.o.   MRN: [id number]  [name] 10/31/2023, 11:38 AM

## 2023-10-31 NOTE — Progress Notes (Signed)
 Occupational Therapy Session Note  Patient Details  Name: Kayla Schmitt MRN: [id number] Date of Birth: 10/11/1965  Today's Date: 10/31/2023 OT Individual Time: 1255-1350 OT Individual Time Calculation (min): 55 min    Short Term Goals: Week 1:  OT Short Term Goal 1 (Week 1): Pt will complete 2/3 toileting steps with CGA for balance OT Short Term Goal 2 (Week 1): Pt will complete sit > stand in prep for ADL with CGA OT Short Term Goal 3 (Week 1): Pt will complete toilet transfer with CGA  Skilled Therapeutic Interventions/Progress Updates:  Patient agreeable to participate in OT session. Reports 5/10 pain level.   Patient participated in skilled OT session focusing on functional activity tolerance, UE strengthening, and sit to stands. Patient completed lateral transfer to wc with CGA. Patient able to don shoe with set up A. Patient educated on proper positioning for standing. Patient completed sit to stand x4 with mod to max A with cues for positioning at grab bars in bathroom to increase functional activity tolerance and increase ability to complete functional mobility. Patient able to complete UE strengthening with education on UE strengthening in all planes of motion. Patient able to complete UE strengthening and demonstrate understanding via teach back. Patient completed wc push ups to elicit increased performance in initiation stage of standing.     Therapy Documentation Precautions:  Precautions Precautions: Fall Precaution/Restrictions Comments: L BKA with wound vac Restrictions Weight Bearing Restrictions Per Provider Order: Yes LLE Weight Bearing Per Provider Order: Non weight bearing  Therapy/Group: Individual Therapy  Kayla Schmitt OTR/L  10/31/2023, 7:45 AM

## 2023-10-31 NOTE — Plan of Care (Signed)
  Problem: Consults Goal: RH LIMB LOSS PATIENT EDUCATION Description: Description: See Patient Education module for eduction specifics. Outcome: Progressing   Problem: RH BOWEL ELIMINATION Goal: RH STG MANAGE BOWEL WITH ASSISTANCE Description: STG Manage Bowel with mod I Assistance. Outcome: Progressing Goal: RH STG MANAGE BOWEL W/MEDICATION W/ASSISTANCE Description: STG Manage Bowel with Medication with mod I Assistance. Outcome: Progressing   Problem: RH SAFETY Goal: RH STG ADHERE TO SAFETY PRECAUTIONS W/ASSISTANCE/DEVICE Description: STG Adhere to Safety Precautions With cues Assistance/Device. Outcome: Progressing

## 2023-10-31 NOTE — Progress Notes (Signed)
 Patient ID: Kayla Schmitt, female   DOB: 12-May-1965, 58 y.o.   MRN: [id number]  Met with pt and mom was present to give both team conference update goals of supervision-mod/I level wheelchair and target discharge date of 7/14. Will work on IV antibiotics and follow up therapies. Gave Pam-PA paperwork for her STD.

## 2023-10-31 NOTE — Progress Notes (Signed)
 Patient ID: Kayla Schmitt, female   DOB: 27-Jan-1966, 58 y.o.   MRN: [id number] Met with the patient to review current medical situation, rehab process, team conference and plan of care. Patient noted not feeling well today; anticipate transfusion for low Hgb. Reviewed secondary risk management including DM on insulin , HTN, HLD, HF and CAD/PAD on IV abx thru 11/18/23. Discussed medications and dietary modification for HH/CMM diet. Reviewed MyChart access. Also reviewed skin care/wound care as recommended per WOC. Continue to follow along to address educational needs to facilitate preparation for discharge. Fredericka Barnie NOVAK, RN

## 2023-10-31 NOTE — Progress Notes (Addendum)
 PROGRESS NOTE   Subjective/Complaints: 2 4XL shrinkers ordered Patient feels malaise, Hgb 6.8, receiving PRBC now, unable to tolerate OT this morning  ROS: pain is well controlled, +malaise  Objective:   No results found. Recent Labs    10/30/23 0423 10/31/23 0411  WBC 5.3 5.3  HGB 7.3* 6.8*  HCT 24.2* 22.6*  PLT 247 227   Recent Labs    10/30/23 0423  NA 137  K 4.1  CL 104  CO2 25  GLUCOSE 126*  BUN 16  CREATININE 1.14*  CALCIUM  7.9*    Intake/Output Summary (Last 24 hours) at 10/31/2023 1111 Last data filed at 10/31/2023 0732 Gross per 24 hour  Intake 876 ml  Output --  Net 876 ml        Physical Exam: Vital Signs Blood pressure 121/60, pulse 82, temperature 99.2 F (37.3 C), temperature source Oral, resp. rate 16, height 5' 4 (1.626 m), weight 96.8 kg, SpO2 96%. Gen: no distress, normal appearing HEENT: oral mucosa pink and moist, NCAT Cardio: Reg rate Chest: normal effort, normal rate of breathing Abd: soft, non-distended Ext: no edema Psych: pleasant, normal affect Skin: intact Musculoskeletal:     Cervical back: Neck supple. No tenderness.     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     Comments: Small scabbed area latera aspect of right transmetatarsal site (question drainage on sock) Left thigh with pressure area medially due to limb guard. Wound VAC in place on L-BKA- good seal. . Popped blister- large ~ 3 inches in diameter on medial posterior aspect of L thigh from LLE limb guard L posterior/medial above knee/on thigh- rubbed area from limb guard- abraded skin in shape of top of limb guard    Neurological:     Mental Status: She is alert and oriented to person, place, and time.     Comments: Decreased to light touch on RLE to mid calf- intact on LLE/L BKA   Psychiatric:        Mood and Affect: Mood normal.        Behavior: Behavior normal.     Assessment/Plan: 1. Functional deficits which require 3+ hours per day of interdisciplinary therapy in a comprehensive inpatient rehab setting. Physiatrist is providing close team supervision and 24 hour management of active medical problems listed below. Physiatrist and rehab team continue to assess barriers to discharge/monitor patient progress toward functional and medical goals  Care Tool:  Bathing    Body parts bathed by patient: Right arm, Left arm, Chest, Abdomen, Right upper leg, Left upper leg, Face   Body parts bathed by helper: Right lower leg, Front perineal area, Buttocks Body parts n/a: Left lower leg   Bathing assist Assist Level: Moderate Assistance - Patient 50 - 74%     Upper Body Dressing/Undressing Upper body dressing   What is the patient wearing?: Pull over shirt    Upper body assist Assist Level: Minimal Assistance - Patient > 75%    Lower Body Dressing/Undressing Lower  body dressing      What is the patient wearing?: Underwear/pull up, Pants     Lower body assist Assist for lower body dressing: Maximal Assistance - Patient 25 - 49%     Toileting Toileting    Toileting assist Assist for toileting: Maximal Assistance - Patient 25 - 49%     Transfers Chair/bed transfer  Transfers assist     Chair/bed transfer assist level: Minimal Assistance - Patient > 75%     Locomotion Ambulation   Ambulation assist   Ambulation activity did not occur: Safety/medical concerns (pain, decreased balance, wound on R heel)          Walk 10 feet activity   Assist  Walk 10 feet activity did not occur: Safety/medical concerns (pain, decreased balance, wound on R heel)        Walk 50 feet activity   Assist Walk 50 feet with 2 turns activity did not occur: Safety/medical concerns (pain, decreased balance, wound on R heel)         Walk 150 feet  activity   Assist Walk 150 feet activity did not occur: Safety/medical concerns (pain, decreased balance, wound on R heel)         Walk 10 feet on uneven surface  activity   Assist Walk 10 feet on uneven surfaces activity did not occur: Safety/medical concerns (pain, decreased balance, wound on R heel)         Wheelchair     Assist Is the patient using a wheelchair?: Yes Type of Wheelchair: Manual    Wheelchair assist level: Supervision/Verbal cueing Max wheelchair distance: 149ft    Wheelchair 50 feet with 2 turns activity    Assist        Assist Level: Supervision/Verbal cueing   Wheelchair 150 feet activity     Assist      Assist Level: Supervision/Verbal cueing   Blood pressure 121/60, pulse 82, temperature 99.2 F (37.3 C), temperature source Oral, resp. rate 16, height 5' 4 (1.626 m), weight 96.8 kg, SpO2 96%.   Medical Problem List and Plan: 1. 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  Antithrombotics: -DVT/anticoagulation:  Pharmaceutical: continue Lovenox              -antiplatelet therapy: continue plavix .   3. Pain Management: continue Oxycodone  prn. Will increase home dose gabapentin  to 300 mg TID- due to her younger age, should be able ot handle the dose- she reports phantom pain feels on fire. Took Gabapentin  at home prior for Restless leg syndrome.   4. Mood/Behavior/Sleep: LCSW to follow for evaluation and support.               -antipsychotic agents: n/a 5. Neuropsych/cognition: This patient is capable of making decisions on her own behalf. 6. Skin/Wound Care: Pressure relief measures. Protein supplements added. Monitor for recurrent drainage.             --foam dressing to area on left thigh.   7.  Fluids/Electrolytes/Nutrition: Monitor I/O. Check CMET in am 8.  MRSA bacteremia: Last CK 522-->continue Daptomycin  with EOT  11/17/23             --  weekly CRP, Sed rate, CPK in addition to CBC/BMP.             --follow up with Dr. Dea 07/17 @ 9:30 am prior to EOT. Zinc , Vitamin C , Vitamin D supplements started   9. T2DM on chronic insulin : Hgb A1C- 5.5 and well controlled (down from 6.4 earlier this year). Monitor BS ac/hs and use SSI for elevated BS.              --continue insulin  glargine 10 units and SSI was added today             --BS variable from 89- 377 in past 24 hours.   -provided list of foods for diabetes 10. Severe PAD s/Kayla stents: On Plavix  and atorvastatin .  Monitor chronically draining area rea on right transmet site.              --educated on increasing protein, low salt as well as monitoring and protection of residual right foot.   11. 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 Hgb decreased to 6.8, PRBC ordered, repeat Hgb ordered for tomorrow stool occult ordered and is negative  12.  CAD/HFpEF w/EF  - EF 60-65%: Elevated troponin felt to be due to demand ischemia --Down to 501 on 06/23.    13.   Mod to severe Aortic stenosis/Moderate MR: Strict I/O w/daily weights. Monitor for signs of overload.   14.  Severe malnutrition: Albumin  level <1.5 w/ calcium  7.6-->corrects to 9.5. RD consulted  15.  Abnormal LFTs: resolved   LOS: 2 days A FACE TO FACE EVALUATION WAS PERFORMED  Kayla Schmitt Kayla Schmitt 10/31/2023, 11:11 AM

## 2023-10-31 NOTE — Progress Notes (Signed)
 Physical Therapy Session Note  Patient Details  Name: Kayla Schmitt MRN: [id number] Date of Birth: Jan 24, 1966  Today's Date: 10/31/2023 PT Individual Time: 8954-8845 and 8643-8547 PT Individual Time Calculation (min): 69 min and 56 min  Short Term Goals: Week 1:  PT Short Term Goal 1 (Week 1): pt will transfer bed<>chair with LRAD and CGA PT Short Term Goal 2 (Week 1): pt will transfer sit<>stand with LRAD and CGA PT Short Term Goal 3 (Week 1): pt will perform simulated car transfer with LRAD and CGA  Skilled Therapeutic Interventions/Progress Updates:   Treatment Session 1 Received pt semi-reclined in bed receiving blood transfusion. Pt reported not feeling well - noted hemoglobin 6.8. Pt agreeable to attempt bed level exercises and reported pain 7/10 in L residual limb - RN notified of request for pain medication. Confirmed with PA, Pam, and pt cleared for bed level therapy while receiving blood transfusion. Provided pt with HEP and educated on frequency/duration/technique for the following exercises: -sidelying hip and knee AROM 3x10 -sidelying hip abduction 3x10 -supine hip abduction 3x10 -supine SLR 3x10 -supine hamstring stretch 3x30 second hold -supine single leg bridge 3x10 -prone hip extension 3x10 -prone knee flexion 3x10 -sidelying hip adduction 3x10 -supine knee extension 3x10 -half sit ups 3x10 -glute squeezes 2x20  Requested order for 4XL shrinkers and pt performed the following exercises with emphasis on LE strength, ROM, and contracture prevention: -SLR 2x15 bilaterally -hip abduction 2x15 bilaterally -hip adduction towel squeezes 2x10 with 5 second hold -R single leg bridges 2x10 - pt unable to clear hips and performing more of a pelvic tilt  Pt reported urge to void - per RN pt okay to transfer to bedside commode then return to bed. Pt transferred semi-reclined<>sitting EOB with HOB elevated and use of bedrails with supervision. Donned R shoe with setup  assist, then removed clothing via lateral leans with supervision and transferred to/from bedside commode via lateral scoot with CGA and assist to stabilize equipment. Pt required increased time but able to void and perform hygiene management without assist. RN present to administer pain medication while pt voided. Located drop arm bariatric bedside commode to replace current drop arm commode.   Returned to bed and pulled pants over hips via lateral leans and pt performed seated L knee extension 2x15 and L hip flexion 2x15 while sitting EOB, while discussing how desensitization (rubbing, massage, and tapping) can help decrease phantom limb pain. Pt then transitioned into supine with supervision/mod I. Concluded session with pt semi-reclined in bed, needs within reach, and bed alarm on. Safety plan updated to lateral scoot and NT made aware. NT at bedside checking blood glucose levels.   Treatment Session 2 Received pt sitting in [facility] with mother at bedside. Pt reported feeling much better after blood transfusion and agreeable to PT treatment. Pt reported pain 5/10 in L residual limb (premedicated). Session with emphasis on functional mobility/transfers, generalized strengthening and endurance, simulated car transfers, dynamic standing balance/coordination, and WC mobility. Donned L limb guard with mod A and pt performed WC mobility 359ft using BUE and supervision and increased time with emphasis on UE strength and coordination - pt reaching out and pulling on R handrail to assist due to fatigue. Pt performed simulated car transfer via squat<>pivot/lateral scoot with heavy reliance on grab bar with CGA and assist to stabilize WC.  Transferred on/off Nustep via lateral scoot with CGA and assist to stabilize WC and performed seated BUE and RLE strengthening Nustep on workload 2 for 8 minutes  on [employer] with emphasis on cardiovascular endurance - total of 499 steps, 0.3 miles, SPM 61, and METs 1.6. Stood from  [facility] with RW x 3 reps with min A and worked on Airline pilot with RUE x 3 trials with CGA/min A for balance - on final trial, R ankle rolled out of shoe, resulting in posterior LOB into WC. Discussed having pt's mom bring in high top shoes to try for improved ankle stability. Returned to room and reviewed seated UE exercises (provided pt with new grn TB). Concluded session with pt sitting in [facility] with all needs within reach and mother at bedside.   Therapy Documentation Precautions:  Precautions Precautions: Fall Precaution/Restrictions Comments: L BKA with wound vac Restrictions Weight Bearing Restrictions Per Provider Order: Yes LLE Weight Bearing Per Provider Order: Non weight bearing  Therapy/Group: Individual Therapy Therisa HERO Zaunegger Therisa Stains PT, DPT 10/31/2023, 7:04 AM

## 2023-11-01 DIAGNOSIS — Z89512 Acquired absence of left leg below knee: Secondary | ICD-10-CM | POA: Diagnosis not present

## 2023-11-01 LAB — TYPE AND SCREEN
ABO/RH(D): O POS
Antibody Screen: NEGATIVE
Unit division: 0

## 2023-11-01 LAB — CBC WITH DIFFERENTIAL/PLATELET
Abs Immature Granulocytes: 0.01 K/uL (ref 0.00–0.07)
Basophils Absolute: 0 K/uL (ref 0.0–0.1)
Basophils Relative: 1 %
Eosinophils Absolute: 0 K/uL (ref 0.0–0.5)
Eosinophils Relative: 1 %
HCT: 24.3 % — ABNORMAL LOW (ref 36.0–46.0)
Hemoglobin: 7.5 g/dL — ABNORMAL LOW (ref 12.0–15.0)
Immature Granulocytes: 0 %
Lymphocytes Relative: 36 %
Lymphs Abs: 1.7 K/uL (ref 0.7–4.0)
MCH: 28.6 pg (ref 26.0–34.0)
MCHC: 30.9 g/dL (ref 30.0–36.0)
MCV: 92.7 fL (ref 80.0–100.0)
Monocytes Absolute: 0.5 K/uL (ref 0.1–1.0)
Monocytes Relative: 10 %
Neutro Abs: 2.5 K/uL (ref 1.7–7.7)
Neutrophils Relative %: 52 %
Platelets: 220 K/uL (ref 150–400)
RBC: 2.62 MIL/uL — ABNORMAL LOW (ref 3.87–5.11)
RDW: 18.1 % — ABNORMAL HIGH (ref 11.5–15.5)
WBC: 4.8 K/uL (ref 4.0–10.5)
nRBC: 0 % (ref 0.0–0.2)

## 2023-11-01 LAB — GLUCOSE, CAPILLARY
Glucose-Capillary: 102 mg/dL — ABNORMAL HIGH (ref 70–99)
Glucose-Capillary: 113 mg/dL — ABNORMAL HIGH (ref 70–99)
Glucose-Capillary: 147 mg/dL — ABNORMAL HIGH (ref 70–99)
Glucose-Capillary: 120 mg/dL — ABNORMAL HIGH (ref 70–99)

## 2023-11-01 LAB — BPAM RBC
Blood Product Expiration Date: 202508052359
ISSUE DATE / TIME: 202507030903
Unit Type and Rh: 5100

## 2023-11-01 NOTE — IPOC Note (Signed)
 Overall Plan of Care [facility]) Patient Details Name: Kayla Schmitt MRN: [id number] DOB: [birth year]  Admitting Diagnosis: S/P BKA (below knee amputation) unilateral, left [facility])  Hospital Problems: Principal Problem:   S/P BKA (below knee amputation) unilateral, left (HCC) Active Problems:   Unilateral complete BKA, left, sequela (HCC)     Functional Problem List: Nursing Pain, Bowel, Safety, Endurance, Medication Management, Skin Integrity  PT Balance, Edema, Endurance, Nutrition, Pain, Skin Integrity  OT Balance, Edema, Endurance, Motor, Pain  SLP    TR         Basic ADL's: OT Bathing, Dressing, Toileting     Advanced  ADL's: OT Simple Meal Preparation     Transfers: PT Bed Mobility, Bed to Chair, Car, Occupational psychologist, Research scientist (life sciences): PT Ambulation, Psychologist, prison and probation services, Stairs     Additional Impairments: OT None  SLP        TR      Anticipated Outcomes Item Anticipated Outcome  Self Feeding Independent  Swallowing      Basic self-care  Mod I  Toileting  Mod I   Bathroom Transfers Mod I  Bowel/Bladder  manage bowel w mod I assist  Transfers  Mod I with LRAD  Locomotion  supervision with LRAD  Communication     Cognition     Pain  Pain < 4 with prns  Safety/Judgment  manage safety w cues   Therapy Plan: PT Intensity: Minimum of 1-2 x/day ,45 to 90 minutes PT Frequency: 5 out of 7 days PT Duration Estimated Length of Stay: 10-12 days OT Intensity: Minimum of 1-2 x/day, 45 to 90 minutes OT Frequency: 5 out of 7 days OT Duration/Estimated Length of Stay: 10-12 days     Team Interventions: Nursing Interventions Pain Management, Patient/Family Education, Bowel Management, Disease Management/Prevention, Medication Management, Discharge Planning  PT interventions Ambulation/gait training, Discharge planning, Functional mobility training, Psychosocial support, Therapeutic Activities, Balance/vestibular training, Disease  management/prevention, Neuromuscular re-education, Skin care/wound management, Therapeutic Exercise, Wheelchair propulsion/positioning, DME/adaptive equipment instruction, Pain management, Splinting/orthotics, UE/LE Strength taining/ROM, Community reintegration, Development worker, international aid stimulation, Patient/family education, Museum/gallery curator, UE/LE Coordination activities  OT Interventions Warden/ranger, Discharge planning, Pain management, Self Care/advanced ADL retraining, Therapeutic Activities, UE/LE Coordination activities, Disease mangement/prevention, Functional mobility training, Patient/family education, Skin care/wound managment, Therapeutic Exercise, DME/adaptive equipment instruction, Neuromuscular re-education, UE/LE Strength taining/ROM, Wheelchair propulsion/positioning  SLP Interventions    TR Interventions    SW/CM Interventions Discharge Planning, Psychosocial Support, Patient/Family Education   Barriers to Discharge MD  Medical stability  Nursing Decreased caregiver support, Home environment access/layout 1 level ramped entry solo; mom across the street to assist prn  PT Decreased caregiver support, Wound Care, Weight, Weight bearing restrictions pain, wound vac, condition of contralateral limb, decreased balance, lives alone  OT Wound Care, Weight bearing restrictions, IV antibiotics    SLP      SW Insurance for SNF coverage     Team Discharge Planning: Destination: PT-Home ,OT- Home , SLP-  Projected Follow-up: PT-Outpatient PT, OT-  Home health OT, SLP-  Projected Equipment Needs: PT-To be determined, OT- To be determined, SLP-  Equipment Details: PT-has WC, SPC, and rollator, OT-  Patient/family involved in discharge planning: PT- Patient,  OT-Patient, SLP-   MD ELOS: 10-12 days Medical Rehab Prognosis:  Excellent Assessment: The patient has been admitted for CIR therapies with the diagnosis of L BKA. The team will be addressing functional mobility,  strength, stamina, balance, safety, adaptive techniques and equipment, self-care, bowel and bladder  mgt, patient and caregiver education. Goals have been set at 10-12 days. Anticipated discharge destination is home.        See Team Conference Notes for weekly updates to the plan of care

## 2023-11-01 NOTE — Progress Notes (Signed)
 Physical Therapy Session Note  Patient Details  Name: Kayla Schmitt MRN: [id number] Date of Birth: 09/29/1965  Today's Date: 11/01/2023 PT Individual Time: 8572-8545 PT Individual Time Calculation (min): 27 min   Short Term Goals: Week 1:  PT Short Term Goal 1 (Week 1): pt will transfer bed<>chair with LRAD and CGA PT Short Term Goal 2 (Week 1): pt will transfer sit<>stand with LRAD and CGA PT Short Term Goal 3 (Week 1): pt will perform simulated car transfer with LRAD and CGA  Skilled Therapeutic Interventions/Progress Updates: Patient in day room gym following OT session. Patient alert and agreeable to PT session.   Patient reported fatigue as it was in later afternoon and had multiple therapy sessions prior. Pt required minA to stand to RW from [facility] during therex with VC for hand placement. - Pt back in room and transferred from WC<BSC at end of session. Pt stated recall of pure lateral transfer (WC directly beside BSC), and attempted transfer but ultimately PTA cued pt to reset and place WC at angle to safely scoot to [facility] with minA for safety.   Therapeutic Exercise: Pt performed the following exercises with therapist providing the described cuing and facilitation for improvement. - Pt standing in RW tossing horseshoes to peg after reaching to R on basketball net that is overhead. Pt performed to improve WB tolerance/strength to R LE. Pt required seated rest break and attempted 2nd round, but pt increasingly required closer to maxA to attempt to stand due to fatigue. - Pt performed R hip flexion with 5lb ankle weight donned seated in WC 2 x 10 - LAQ R LE 5 lb ankle weight donned 2 x 10  Patient on BSC at end of session with mother present, call bell in reach and nsg aware of pt toileting.     Therapy Documentation Precautions:  Precautions Precautions: Fall Precaution/Restrictions Comments: L BKA with wound vac Restrictions Weight Bearing Restrictions Per Provider Order:  Yes LLE Weight Bearing Per Provider Order: Non weight bearing   Therapy/Group: Individual Therapy  Demarcus Thielke PTA 11/01/2023, 2:59 PM

## 2023-11-01 NOTE — Progress Notes (Signed)
 Occupational Therapy Session Note  Patient Details  Name: Kayla Schmitt MRN: [id number] Date of Birth: [birth year]  Today's Date: 11/01/2023 OT Individual Time: 1020-1100 & 8667-8572 OT Individual Time Calculation (min): 40 min & 55 min   Short Term Goals: Week 1:  OT Short Term Goal 1 (Week 1): Pt will complete 2/3 toileting steps with CGA for balance OT Short Term Goal 2 (Week 1): Pt will complete sit > stand in prep for ADL with CGA OT Short Term Goal 3 (Week 1): Pt will complete toilet transfer with CGA  Skilled Therapeutic Interventions/Progress Updates:  .Session 1 Skilled OT intervention completed with focus on w/c mobility, and cardiovascular and BUE endurance. Pt received seated in w/c, agreeable to session. No pain reported.  Pt switched out shirt for 4th of July shirt with set up A. Limb guard already in place. Pt self-propelled in w/c for 100 ft distances with mod I using BUE.  In gym, pt participated in dance group using BUE and RLE to complete AROM, exercises on beat for social engagement as well as cardiovascular endurance. Pt politely declined standing for increased challenge due to fatigue from earlier session and pt enjoyment with current activity.  Back in room, pt remained seated in w/c, with mother present and with all needs in reach at end of session.  Session 2 Skilled OT intervention completed with focus on dynamic standing balance, BUE strengthening. Pt received seated in w/c, agreeable to session. No pain reported.  Pt self-propelled in w/c <> gym with mod I. Participated in icecream social at w/c level with ability to throw away trash without assist.   Pt participated in the following dynamic standing balance and endurance tasks to promote independence and safety during BADLs and functional mobility: -connect 4 activity in standing; CGA needed for sit > stand using RW, min A for dynamic standing balance with RW. On 1/3 of trials, pt did have big LOB to  w/c requiring guidance to w/c due to R ankle instability. Swapped out current tennis shoe for converse hip top with TMA insert. Stability noted to slightly improve. Required seated breaks for RLE fatigue  Seated in w/c, pt completed the following BUE exercises to promote BUE strength/endurance needed for independence with functional transfers and BADLs: (With yellow theraband) 15 reps Horizontal abduction Self-anchored shoulder flexion each arm Self-anchored bicep flexion each arm Self-anchored tricep extension each arm Shoulder external rotation Shoulder diagonal pulls  Back in room, pt remained seated in w/c, with mother present and with all needs in reach at end of session.   Therapy Documentation Precautions:  Precautions Precautions: Fall Precaution/Restrictions Comments: L BKA with wound vac Restrictions Weight Bearing Restrictions Per Provider Order: Yes LLE Weight Bearing Per Provider Order: Non weight bearing    Therapy/Group: Individual Therapy  Lorrayne FORBES Fritter, MS, OTR/L  11/01/2023, 2:31 PM

## 2023-11-01 NOTE — Progress Notes (Signed)
 PROGRESS NOTE   Subjective/Complaints: Patient's chart reviewed- No issues reported overnight Vitals signs stable  Hgb improved to 7.5  ROS: pain is well controlled, +malaise, +fatigue  Objective:   No results found. Recent Labs    10/31/23 0411 11/01/23 0426  WBC 5.3 4.8  HGB 6.8* 7.5*  HCT 22.6* 24.3*  PLT 227 220   Recent Labs    10/30/23 0423  NA 137  K 4.1  CL 104  CO2 25  GLUCOSE 126*  BUN 16  CREATININE 1.14*  CALCIUM  7.9*    Intake/Output Summary (Last 24 hours) at 11/01/2023 0919 Last data filed at 11/01/2023 0700 Gross per 24 hour  Intake 698 ml  Output --  Net 698 ml        Physical Exam: Vital Signs Blood pressure (!) 141/59, pulse 79, temperature 98.5 F (36.9 C), temperature source Oral, resp. rate 16, height 5' 4 (1.626 m), weight 101.8 kg, SpO2 99%. Gen: no distress, normal appearing HEENT: oral mucosa pink and moist, NCAT Cardio: Reg rate Chest: normal effort, normal rate of breathing Abd: soft, non-distended Ext: no edema Psych: pleasant, normal affect Skin: intact Musculoskeletal:     Cervical back: Neck supple. No tenderness.     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     Comments: Small scabbed area latera aspect of right transmetatarsal site (question drainage on sock) Left thigh with pressure area medially due to limb guard. Wound VAC in place on L-BKA- good seal. . Popped blister- large ~ 3 inches in diameter on medial posterior aspect of L thigh from LLE limb guard L posterior/medial above knee/on thigh- rubbed area from limb guard- abraded skin in shape of top of limb guard    Neurological:     Mental Status: Kayla Schmitt is alert and oriented to person, place, and time.     Comments: Decreased to light touch on RLE to mid calf- intact on LLE/L BKA   Psychiatric:        Mood and Affect: Mood normal.        Behavior: Behavior normal.     Assessment/Plan: 1. Functional deficits which require 3+ hours per day of interdisciplinary therapy in a comprehensive inpatient rehab setting. Physiatrist is providing close team supervision and 24 hour management of active medical problems listed below. Physiatrist and rehab team continue to assess barriers to discharge/monitor patient progress toward functional and medical goals  Care Tool:  Bathing    Body parts bathed by patient: Right arm, Left arm, Chest, Abdomen, Right upper leg, Left upper leg, Face   Body parts bathed by helper: Right lower leg, Front perineal area, Buttocks Body parts n/a: Left lower leg   Bathing assist Assist Level: Moderate Assistance - Patient 50 - 74%     Upper Body Dressing/Undressing Upper body dressing   What is the patient wearing?: Pull over shirt    Upper body assist Assist Level: Minimal Assistance - Patient > 75%    Lower Body Dressing/Undressing Lower body dressing  What is the patient wearing?: Underwear/pull up, Pants     Lower body assist Assist for lower body dressing: Maximal Assistance - Patient 25 - 49%     Toileting Toileting    Toileting assist Assist for toileting: Maximal Assistance - Patient 25 - 49%     Transfers Chair/bed transfer  Transfers assist     Chair/bed transfer assist level: Contact Guard/Touching assist     Locomotion Ambulation   Ambulation assist   Ambulation activity did not occur: Safety/medical concerns (pain, decreased balance, wound on R heel)          Walk 10 feet activity   Assist  Walk 10 feet activity did not occur: Safety/medical concerns (pain, decreased balance, wound on R heel)        Walk 50 feet activity   Assist Walk 50 feet with 2 turns activity did not occur: Safety/medical concerns (pain, decreased balance, wound on R heel)         Walk 150 feet  activity   Assist Walk 150 feet activity did not occur: Safety/medical concerns (pain, decreased balance, wound on R heel)         Walk 10 feet on uneven surface  activity   Assist Walk 10 feet on uneven surfaces activity did not occur: Safety/medical concerns (pain, decreased balance, wound on R heel)         Wheelchair     Assist Is the patient using a wheelchair?: Yes Type of Wheelchair: Manual    Wheelchair assist level: Supervision/Verbal cueing Max wheelchair distance: 185ft    Wheelchair 50 feet with 2 turns activity    Assist        Assist Level: Supervision/Verbal cueing   Wheelchair 150 feet activity     Assist      Assist Level: Supervision/Verbal cueing   Blood pressure (!) 141/59, pulse 79, temperature 98.5 F (36.9 C), temperature source Oral, resp. rate 16, height 5' 4 (1.626 m), weight 101.8 kg, SpO2 99%.   Medical Problem List and Plan: 1. 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  Antithrombotics: -DVT/anticoagulation:  Pharmaceutical: continue Lovenox              -antiplatelet therapy: continue plavix .   3. Pain Management: continue Oxycodone  prn. Will increase home dose gabapentin  to 300 mg TID- due to her younger age, should be able ot handle the dose- Kayla Schmitt reports phantom pain feels on fire. Took Gabapentin  at home prior for Restless leg syndrome.   4. Mood/Behavior/Sleep: LCSW to follow for evaluation and support.               -antipsychotic agents: n/a 5. Neuropsych/cognition: This patient is capable of making decisions on her own behalf. 6. Skin/Wound Care: Pressure relief measures. Protein supplements added. Monitor for recurrent drainage.             --foam dressing to area on left thigh.    7. Fluids/Electrolytes/Nutrition: Monitor I/O. Check CMET in am 8.  MRSA bacteremia: Last CK 522-->continue Daptomycin  with EOT  11/17/23             --  weekly CRP, Sed rate, CPK in addition to CBC/BMP.             --follow up with Dr. Dea 07/17 @ 9:30 am prior to EOT. Zinc , Vitamin C , Vitamin D  supplements started   9. T2DM on chronic insulin : Hgb A1C- 5.5 and well controlled (down from 6.4 earlier this year). Monitor BS ac/hs and use SSI for elevated BS.              --continue insulin  glargine 10 units and SSI was added today             --BS variable from 89- 377 in past 24 hours.   -provided list of foods for diabetes 10. Severe PAD s/p stents: On Plavix  and atorvastatin .  Monitor chronically draining area rea on right transmet site.              --educated on increasing protein, low salt as well as monitoring and protection of residual right foot.   11. 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 Hgb decreased to 6.8, PRBC ordered, Hgb reviewed and is improved to 7.5, stool occult ordered and is negative  12.  CAD/HFpEF w/EF  - EF 60-65%: Elevated troponin felt to be due to demand ischemia --Down to 501 on 06/23.    13.   Mod to severe Aortic stenosis/Moderate MR: Strict I/O w/daily weights. Monitor for signs of overload.   14.  Severe malnutrition: Albumin  level <1.5 w/ calcium  7.6-->corrects to 9.5. RD consulted  15.  Abnormal LFTs: resolved   LOS: 3 days A FACE TO FACE EVALUATION WAS PERFORMED  Kayla Schmitt Kayla Schmitt 11/01/2023, 9:19 AM

## 2023-11-01 NOTE — Progress Notes (Signed)
 Physical Therapy Session Note  Patient Details  Name: Kayla Schmitt MRN: [id number] Date of Birth: 1965-11-11  Today's Date: 11/01/2023 PT Individual Time: 0900-1010 PT Individual Time Calculation (min): 70 min   Short Term Goals: Week 1:  PT Short Term Goal 1 (Week 1): pt will transfer bed<>chair with LRAD and CGA PT Short Term Goal 2 (Week 1): pt will transfer sit<>stand with LRAD and CGA PT Short Term Goal 3 (Week 1): pt will perform simulated car transfer with LRAD and CGA  Skilled Therapeutic Interventions/Progress Updates:   Received pt semi-reclined in bed, pt agreeable to PT treatment, and reported pain 5/10 in L residual limb (premedicated). Session with emphasis on functional mobility/transfers, generalized strengthening and endurance, dynamic standing balance/coordination, and WC management. Pt transferred semi-reclined<>sitting L EOB with HOB elevated and use of bedrails mod I and donned R shoe and L limb guard with supervision. Covered open blister on posterior thigh with foam dressing and pt performed lateral scoot into WC with CGA and assist to stabilize WC.   Pt performed WC mobility 136ft x 2 trials using BUE and supervision/mod I to/from dayroom with emphasis on UE strength/coordination. Educated pt on donning/doffing legrests with supervision (pt would benefit from continued practice) and set up transfer to mat with min cues for locking brakes and flipping back armrests - performed lateral scoot to/from mat with supervision and assist to stabilize WC.  Stood from [facility] with RW and min A x 4 trials (cues for L hand placement on RW and R hand placement on EOM) and performed the following standing exercises with emphasis on LE strength, balance, and core strength: -alternating UE raises 1x3 and 1x10 bilaterally with CGA/min A for balance - limited by R ankle rolling out of shoe -standing L hip flexion 2x10 -standing L hip abduction 2x10  MD arrived for morning rounds, then  transitioned to the following seated exercises with emphasis on LE, UE, and core strength: -R knee extension 2x20 with 2.5lb ankle weight -R hip flexion 2x20 with 2.5lb ankle weight  -tricep extensions on yoga blocks 2x15 -batting beachball with 3lb dowel x20 increasing to 5lb dowel 2x20 while sitting on dynodisc -lateral trunk rotations with 6lb medicine ball 2x10 bilaterally while sitting on dynodisc -overhead chest press with 6lb medicine ball while sitting on dynodisc -horizontal chest press with 6lb medicine ball while sitting on dynodisc Picked up 4th of July coloring pages and painting supplies to work on in between therapy sessions. Concluded session with pt sitting in [facility] with all needs within reach awaiting upcoming OT session.    Therapy Documentation Precautions:  Precautions Precautions: Fall Precaution/Restrictions Comments: L BKA with wound vac Restrictions Weight Bearing Restrictions Per Provider Order: Yes LLE Weight Bearing Per Provider Order: Non weight bearing  Therapy/Group: Individual Therapy Therisa HERO Zaunegger Therisa Stains PT, DPT 11/01/2023, 7:01 AM

## 2023-11-02 ENCOUNTER — Inpatient Hospital Stay (HOSPITAL_COMMUNITY)

## 2023-11-02 DIAGNOSIS — Z89512 Acquired absence of left leg below knee: Secondary | ICD-10-CM | POA: Diagnosis not present

## 2023-11-02 LAB — GLUCOSE, CAPILLARY
Glucose-Capillary: 122 mg/dL — ABNORMAL HIGH (ref 70–99)
Glucose-Capillary: 137 mg/dL — ABNORMAL HIGH (ref 70–99)
Glucose-Capillary: 120 mg/dL — ABNORMAL HIGH (ref 70–99)
Glucose-Capillary: 139 mg/dL — ABNORMAL HIGH (ref 70–99)

## 2023-11-02 LAB — OCCULT BLOOD X 1 CARD TO LAB, STOOL: Fecal Occult Bld: NEGATIVE

## 2023-11-02 MED ORDER — GABAPENTIN 100 MG PO CAPS
200.0000 mg | ORAL_CAPSULE | Freq: Three times a day (TID) | ORAL | Status: DC
  Administered 2023-11-02 – 2023-11-12 (×30): 200 mg via ORAL
  Filled 2023-11-02 (×30): qty 2

## 2023-11-02 MED ORDER — FUROSEMIDE 40 MG PO TABS
40.0000 mg | ORAL_TABLET | Freq: Two times a day (BID) | ORAL | Status: DC
  Administered 2023-11-03 – 2023-11-07 (×9): 40 mg via ORAL
  Filled 2023-11-02 (×9): qty 1

## 2023-11-02 MED ORDER — FUROSEMIDE 20 MG PO TABS
20.0000 mg | ORAL_TABLET | Freq: Once | ORAL | Status: AC
  Administered 2023-11-02: 20 mg via ORAL
  Filled 2023-11-02: qty 1

## 2023-11-02 MED ORDER — SODIUM CHLORIDE 0.9% FLUSH: 10.0000 mL | INTRAVENOUS | Status: DC | PRN

## 2023-11-02 MED ORDER — SODIUM CHLORIDE 0.9% FLUSH
10.0000 mL | Freq: Two times a day (BID) | INTRAVENOUS | Status: DC
  Administered 2023-11-02 – 2023-11-03 (×4): 10 mL

## 2023-11-02 NOTE — Progress Notes (Signed)
 PROGRESS NOTE   Subjective/Complaints: No new complaints initially this morning, but heard from nursing later in the day that patient was having 3+ edema to RLE, vas US  ordered  ROS: pain is well controlled, +malaise, +fatigue, +RLE edema  Objective:   No results found. Recent Labs    10/31/23 0411 11/01/23 0426  WBC 5.3 4.8  HGB 6.8* 7.5*  HCT 22.6* 24.3*  PLT 227 220   No results for input(s): NA, K, CL, CO2, GLUCOSE, BUN, CREATININE, CALCIUM  in the last 72 hours.   Intake/Output Summary (Last 24 hours) at 11/02/2023 1627 Last data filed at 11/02/2023 0718 Gross per 24 hour  Intake 480 ml  Output --  Net 480 ml        Physical Exam: Vital Signs Blood pressure 132/67, pulse 83, temperature 98.6 F (37 C), temperature source Oral, resp. rate 18, height 5' 4 (1.626 m), weight 98.9 kg, SpO2 98%. Gen: no distress, normal appearing HEENT: oral mucosa pink and moist, NCAT Cardio: Reg rate Chest: normal effort, normal rate of breathing Abd: soft, non-distended Ext: no edema Psych: pleasant, normal affect Skin: intact Musculoskeletal:     Cervical back: Neck supple. No tenderness.     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     Comments: Small scabbed area latera aspect of right transmetatarsal site (question drainage on sock) Left thigh with pressure area medially due to limb guard. Wound VAC in place on L-BKA- good seal. . Popped blister- large ~ 3 inches in diameter on medial posterior aspect of L thigh from LLE limb guard L posterior/medial above knee/on thigh- rubbed area from limb guard- abraded skin in shape of top of limb guard    Neurological:     Mental Status: She is alert and oriented to person, place, and time.     Comments: Decreased to light touch on  RLE to mid calf- intact on LLE/L BKA  Psychiatric:        Mood and Affect: Mood normal.        Behavior: Behavior normal.     Assessment/Plan: 1. Functional deficits which require 3+ hours per day of interdisciplinary therapy in a comprehensive inpatient rehab setting. Physiatrist is providing close team supervision and 24 hour management of active medical problems listed below. Physiatrist and rehab team continue to assess barriers to discharge/monitor patient progress toward functional and medical goals  Care Tool:  Bathing    Body parts bathed by patient: Right arm, Left arm, Chest, Abdomen, Left upper leg, Face, Front perineal area, Buttocks, Right upper leg, Right lower leg   Body parts bathed by helper: Right lower leg, Front perineal area, Buttocks Body parts n/a: Left lower leg   Bathing assist Assist Level: Supervision/Verbal cueing     Upper Body Dressing/Undressing Upper body dressing   What is the patient wearing?: Pull over shirt    Upper body assist Assist Level: Set up assist    Lower Body Dressing/Undressing Lower body dressing  What is the patient wearing?: Underwear/pull up, Pants     Lower body assist Assist for lower body dressing: Minimal Assistance - Patient > 75%     Toileting Toileting    Toileting assist Assist for toileting: Maximal Assistance - Patient 25 - 49%     Transfers Chair/bed transfer  Transfers assist     Chair/bed transfer assist level: Contact Guard/Touching assist     Locomotion Ambulation   Ambulation assist   Ambulation activity did not occur: Safety/medical concerns (pain, decreased balance, wound on R heel)          Walk 10 feet activity   Assist  Walk 10 feet activity did not occur: Safety/medical concerns (pain, decreased balance, wound on R heel)        Walk 50 feet activity   Assist Walk 50 feet with 2 turns activity did not occur: Safety/medical concerns (pain, decreased balance, wound  on R heel)         Walk 150 feet activity   Assist Walk 150 feet activity did not occur: Safety/medical concerns (pain, decreased balance, wound on R heel)         Walk 10 feet on uneven surface  activity   Assist Walk 10 feet on uneven surfaces activity did not occur: Safety/medical concerns (pain, decreased balance, wound on R heel)         Wheelchair     Assist Is the patient using a wheelchair?: Yes Type of Wheelchair: Manual    Wheelchair assist level: Independent Max wheelchair distance: 75    Wheelchair 50 feet with 2 turns activity    Assist        Assist Level: Independent   Wheelchair 150 feet activity     Assist      Assist Level: Independent   Blood pressure 132/67, pulse 83, temperature 98.6 F (37 C), temperature source Oral, resp. rate 18, height 5' 4 (1.626 m), weight 98.9 kg, SpO2 98%.   Medical Problem List and Plan: 1. 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  Antithrombotics: -DVT/anticoagulation:  Pharmaceutical: continue Lovenox              -antiplatelet therapy: continue plavix .   3. Pain Management: continue Oxycodone  prn. Will increase home dose gabapentin  to 300 mg TID- due to her younger age, should be able ot handle the dose- she reports phantom pain feels on fire. Took Gabapentin  at home prior for Restless leg syndrome.   4. Mood/Behavior/Sleep: LCSW to follow for evaluation and support.               -antipsychotic agents: n/a 5. Neuropsych/cognition: This patient is capable of making decisions on her own behalf. 6. Skin/Wound Care: Pressure relief measures. Protein supplements added. Monitor for recurrent drainage.             --foam dressing to area on left thigh.   7.  Fluids/Electrolytes/Nutrition: Monitor I/O. Check CMET in am 8.  MRSA bacteremia: Last CK 522-->continue Daptomycin  with EOT  11/17/23             --weekly CRP, Sed rate, CPK  in addition to CBC/BMP.             --follow up with Dr. Dea 07/17 @ 9:30 am prior to EOT. Zinc , Vitamin C , Vitamin D  supplements started   9. T2DM on chronic insulin : Hgb A1C- 5.5 and well controlled (down from 6.4 earlier this year). Monitor BS ac/hs and use SSI for elevated BS.              --continue insulin  glargine 10 units and SSI was added today             --BS variable from 89- 377 in past 24 hours.   -provided list of foods for diabetes 10. Severe PAD s/p stents: On Plavix  and atorvastatin .  Monitor chronically draining area rea on right transmet site.              --educated on increasing protein, low salt as well as monitoring and protection of residual right foot.   11. 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 Hgb decreased to 6.8, PRBC ordered, Hgb reviewed and is improved to 7.5, stool occult ordered and is negative, repeat Hgb Monday  12.  CAD/HFpEF w/EF  - EF 60-65%: Elevated troponin felt to be due to demand ischemia --Down to 501 on 06/23.   Lasix  20mg  ordered 7/5 Weights reviewed and are stable  13.   Mod to severe Aortic stenosis/Moderate MR: Strict I/O w/daily weights. Monitor for signs of overload. Lasix  20mg  ordered 7/5  14.  Severe malnutrition: Albumin  level <1.5 w/ calcium  7.6-->corrects to 9.5. RD consulted  15.  Abnormal LFTs: resolved  16. RLE edema: lasix  20mg  ordered, Echo reviewed and shows severe aortic stenosis. Vas US  ordered. Gabapentin  reduced to 200mg  TID   LOS: 4 days A FACE TO FACE EVALUATION WAS PERFORMED  Sven SQUIBB Keil Pickering 11/02/2023, 4:27 PM

## 2023-11-02 NOTE — Plan of Care (Signed)
  Problem: Consults Goal: RH LIMB LOSS PATIENT EDUCATION Description: Description: See Patient Education module for eduction specifics. Outcome: Progressing   Problem: RH BOWEL ELIMINATION Goal: RH STG MANAGE BOWEL WITH ASSISTANCE Description: STG Manage Bowel with mod I Assistance. Outcome: Progressing Goal: RH STG MANAGE BOWEL W/MEDICATION W/ASSISTANCE Description: STG Manage Bowel with Medication with mod I Assistance. Outcome: Progressing   Problem: RH SAFETY Goal: RH STG ADHERE TO SAFETY PRECAUTIONS W/ASSISTANCE/DEVICE Description: STG Adhere to Safety Precautions With cues Assistance/Device. Outcome: Progressing   Problem: RH PAIN MANAGEMENT Goal: RH STG PAIN MANAGED AT OR BELOW PT'S PAIN GOAL Description: < 4 with prns Outcome: Progressing   Problem: RH KNOWLEDGE DEFICIT LIMB LOSS Goal: RH STG INCREASE KNOWLEDGE OF SELF CARE AFTER LIMB LOSS Description: Patient and mother will be able to manage care at discharge using educational resources for medications, skin care, and dietary modification independently Outcome: Progressing

## 2023-11-02 NOTE — Progress Notes (Signed)
 Nursing called regarding patient feeling SOB. Apparently in no distress, vitals stable.  Per report, received 40mg  Lasix  PO earlier today (not 20mg  as documented in the note from earlier)-- but she had been off her Lasix  during the hospitalization.  Also did a lot of therapy today so thought she might just be tired from that.  Chart review reveals she had a drop in her Hgb in the last week and a half, from 10-11 down to 6.8 two days ago, and was transfused (nursing reviewed MAR, doesn't look like she was treated with Lasix  during the transfusion).   Suspect this is just an amalgam of mild fluid overload and deconditioning and symptomatic anemia.  Will get CBC, BMP, BNP, CXR.   Nursing to call if patient worsens.  Will monitor for results, but feel free to call with any critical values.   Shevon Sian PA-C  11/02/23 @2026 

## 2023-11-02 NOTE — Progress Notes (Signed)
 Occupational Therapy Session Note  Patient Details  Name: Kayla Schmitt MRN: [id number] Date of Birth: September 30, 1965  Today's Date: 11/02/2023 OT Individual Time: 0805-0900 OT Individual Time Calculation (min): 55 min    Short Term Goals: Week 1:  OT Short Term Goal 1 (Week 1): Pt will complete 2/3 toileting steps with CGA for balance OT Short Term Goal 2 (Week 1): Pt will complete sit > stand in prep for ADL with CGA OT Short Term Goal 3 (Week 1): Pt will complete toilet transfer with CGA  Skilled Therapeutic Interventions/Progress Updates:  Skilled OT intervention completed with focus on ADL retraining, functional transfers, DC planning. Pt received seated EOB, agreeable to session. Little phantom pain reported in L BKA; pre-medicated. OT offered rest breaks and repositioning throughout for pain reduction.  Pt had lost the power cord to the portable wound vac and machine was beeping low battery. Eventually able to locate in back of w/c bag and reconnected for battery charge.   Pt agreeable to ADLs. Required cues for w/c management prior to transfer. Pt requested that bed height be elevated to w/c- inquired if her bed was adjustable and similar height in which pt indicated she has tall bed and at baseline uses ropes to pull herself into bed. Encouraged pt to have mother measure bed height due to current lateral transfer need and anticipation at DC, however pt with slight impairment into deficits stating I can measure it, but I can just hop without the RW > EOB and use the ropes to pull myself in. Education provided about potential w/c level recommendation at home due to frequent instability and LOB when dynamically balancing with RW.  CGA lateral scoot > w/c. Seated at sink, pt washed/dressed UB set up A. Cues needed for lateral leaning to doff/donn shorts and wash peri areas (to simulate home, w/c level function) with education provided about safety considerations especially with  anterior leaning due to observed LOB forward but no assist to correct. Required supervision to lower clothing and bathe peri areas, however min A to donn shorts via w/c push up- pt reports baseline bed level for LB dressing due to difficulty with body habitus. Donned limb guard with set up A.  Briefly discussed shower set up and lateral transfers needed- pt with again poor insight into deficits and safety implications pt reported I'll just crawl over the shower threshold to my corner/shower seat. Introduced pt to TTB that can go over threshold for safety with lateral transfer. Transported dependently in w/c <> ADL apartment for time. Demonstrated lateral scoot to TTB over shower threshold and pt with improved awareness. Discussed purchasing on [place].  Back in room, pt remained seated in w/c, with chair alarm on/activated, and with all needs in reach at end of session.   Therapy Documentation Precautions:  Precautions Precautions: Fall Precaution/Restrictions Comments: L BKA with wound vac Restrictions Weight Bearing Restrictions Per Provider Order: Yes LLE Weight Bearing Per Provider Order: Non weight bearing    Therapy/Group: Individual Therapy  Lorrayne FORBES Fritter, MS, OTR/L  11/02/2023, 9:03 AM

## 2023-11-02 NOTE — Progress Notes (Signed)
 Physical Therapy Session Note  Patient Details  Name: Kayla Schmitt MRN: [id number] Date of Birth: Jun 01, 1965  Today's Date: 11/02/2023 PT Individual Time: 1417-1530 PT Individual Time Calculation (min): 73 min   Short Term Goals: Week 1:  PT Short Term Goal 1 (Week 1): pt will transfer bed<>chair with LRAD and CGA PT Short Term Goal 2 (Week 1): pt will transfer sit<>stand with LRAD and CGA PT Short Term Goal 3 (Week 1): pt will perform simulated car transfer with LRAD and CGA  Skilled Therapeutic Interventions/Progress Updates: Pt presents sitting in w/c and agreeable to therapy.  Pt donned limb protector w/ supervision.  PT wheeled pt to outdoors and then pt negotiated w/c on uneven surfaces w/ independence x 75'.  Pt performed lateral scoot w/c > bench w/ CGA for w/c stabilization on downward bias.  Pt doffed protector and performed 3 x 15 Bilateral LAQ/knee extension, hip flexion, abd/add.  Pt donned protector w/ sup and cues and performed lateral scoot w/ CGA but pt moving before cueing and required min A to clear onto seat 2/2 upward bias.  Pt cued for forward lean for WB through RLE.  Pt performing arm rests and limb board independently.  Pt returned to indoors.  Pt performed lateral scoot w/c > bed w/ supervision.  R LE elevated on 2 pillows 2/2 increased edema.  Bed alarm on and all needs in reach, mother present.     Therapy Documentation Precautions:  Precautions Precautions: Fall Precaution/Restrictions Comments: L BKA with wound vac Restrictions Weight Bearing Restrictions Per Provider Order: Yes LLE Weight Bearing Per Provider Order: Non weight bearing General:   Vital Signs:   Pain:5/10 Pain Assessment Pain Scale: 0-10 Pain Score: 3  Pain Location: Leg Pain Intervention(s): Medication (See eMAR)    Therapy/Group: Individual Therapy  Archimedes Harold P [name] 11/02/2023, 4:02 PM

## 2023-11-02 NOTE — Progress Notes (Signed)
 Physical Therapy Session Note  Patient Details  Name: Kayla Schmitt MRN: [id number] Date of Birth: 11-01-1965  Today's Date: 11/02/2023 PT Individual Time: 9058-8960 PT Individual Time Calculation (min): 58 min   Short Term Goals: Week 1:  PT Short Term Goal 1 (Week 1): pt will transfer bed<>chair with LRAD and CGA PT Short Term Goal 2 (Week 1): pt will transfer sit<>stand with LRAD and CGA PT Short Term Goal 3 (Week 1): pt will perform simulated car transfer with LRAD and CGA  Skilled Therapeutic Interventions/Progress Updates:   Received pt siting in [facility], pt agreeable to PT treatment, and reported pain in L residual limb was tolerable. Session with emphasis on functional mobility/transfers, generalized strengthening and endurance, and dynamic standing balance/coordination. Pt performed WC mobility 118ft using BUE and supervision/mod I to main therapy gym with emphasis on UE strength and coordination.   MD arrived for morning rounds and pt able to set up transfer to mat with min cues for removing legrests but able to recall locking brakes and flipping back armrest without cues. Performed lateral scoot to/from mat with close supervision with assist to stabilize WC and transitioned into supine on wedge. Pt performed the following exercises with emphasis on LE strength/ROM, core strength, and contracture prevention: -supine R single leg bridges 2x10 -supine L SLR 2x10 -supine shoulder extensions into physioball 2x10 with 5 second hold -R sidelying L hip abduction 2x10 -R sidelying L hip extension 2x10 -prone L knee flexion 2x10 -prone L hip extension 2x10  Educated pt on importance of spending time in prone to avoid hip flexor contractures to prepare for gait training with prosthetic - pt verbalized understanding. Pt unable to tolerate lying on side or in prone for extended periods of time due to difficulty breathing. Transferred prone<>sitting EOM with supervision and donned limb  guard. Pt stood from elevated EOM with RW and CGA x 5 trials with CGA (good recall of hand placement) and worked on Airline pilot with RUE (pt relying on posterior knee support on mat for balance) - noted improved standing balance with high top shoes vs regular sneakers. Transitioned to standing L hip flexion 3x10 and L hip abduction 2x10 with emphasis on LE strength/ROM with CGA for balance. Pt performed x 5 blocked practice sit<>stands with RW and CGA with emphasis on quad strength - pt reporting 8/10 fatigue at end of session. Transported back to room in [facility] dependently and concluded session with pt sitting in [facility] with all needs within reach and mother present at bedside.   Therapy Documentation Precautions:  Precautions Precautions: Fall Precaution/Restrictions Comments: L BKA with wound vac Restrictions Weight Bearing Restrictions Per Provider Order: Yes LLE Weight Bearing Per Provider Order: Non weight bearing  Therapy/Group: Individual Therapy Therisa HERO Zaunegger Therisa Stains PT, DPT 11/02/2023, 7:00 AM

## 2023-11-03 ENCOUNTER — Inpatient Hospital Stay (HOSPITAL_COMMUNITY)

## 2023-11-03 DIAGNOSIS — Z89512 Acquired absence of left leg below knee: Secondary | ICD-10-CM | POA: Diagnosis not present

## 2023-11-03 DIAGNOSIS — R609 Edema, unspecified: Secondary | ICD-10-CM | POA: Diagnosis not present

## 2023-11-03 LAB — CBC WITH DIFFERENTIAL/PLATELET
Abs Immature Granulocytes: 0.02 K/uL (ref 0.00–0.07)
Basophils Absolute: 0 K/uL (ref 0.0–0.1)
Basophils Relative: 0 %
Eosinophils Absolute: 0 K/uL (ref 0.0–0.5)
Eosinophils Relative: 1 %
HCT: 23.8 % — ABNORMAL LOW (ref 36.0–46.0)
Hemoglobin: 7.5 g/dL — ABNORMAL LOW (ref 12.0–15.0)
Immature Granulocytes: 0 %
Lymphocytes Relative: 27 %
Lymphs Abs: 1.7 K/uL (ref 0.7–4.0)
MCH: 29 pg (ref 26.0–34.0)
MCHC: 31.5 g/dL (ref 30.0–36.0)
MCV: 91.9 fL (ref 80.0–100.0)
Monocytes Absolute: 0.6 K/uL (ref 0.1–1.0)
Monocytes Relative: 9 %
Neutro Abs: 4.1 K/uL (ref 1.7–7.7)
Neutrophils Relative %: 63 %
Platelets: 229 K/uL (ref 150–400)
RBC: 2.59 MIL/uL — ABNORMAL LOW (ref 3.87–5.11)
RDW: 17.5 % — ABNORMAL HIGH (ref 11.5–15.5)
WBC: 6.4 K/uL (ref 4.0–10.5)
nRBC: 0 % (ref 0.0–0.2)

## 2023-11-03 LAB — BASIC METABOLIC PANEL WITH GFR
Anion gap: 6 (ref 5–15)
BUN: 25 mg/dL — ABNORMAL HIGH (ref 6–20)
CO2: 25 mmol/L (ref 22–32)
Calcium: 8.2 mg/dL — ABNORMAL LOW (ref 8.9–10.3)
Chloride: 106 mmol/L (ref 98–111)
Creatinine, Ser: 1.11 mg/dL — ABNORMAL HIGH (ref 0.44–1.00)
GFR, Estimated: 58 mL/min — ABNORMAL LOW (ref >60)
Glucose, Bld: 140 mg/dL — ABNORMAL HIGH (ref 70–99)
Potassium: 4 mmol/L (ref 3.5–5.1)
Sodium: 137 mmol/L (ref 135–145)

## 2023-11-03 LAB — GLUCOSE, CAPILLARY
Glucose-Capillary: 108 mg/dL — ABNORMAL HIGH (ref 70–99)
Glucose-Capillary: 123 mg/dL — ABNORMAL HIGH (ref 70–99)
Glucose-Capillary: 139 mg/dL — ABNORMAL HIGH (ref 70–99)
Glucose-Capillary: 160 mg/dL — ABNORMAL HIGH (ref 70–99)

## 2023-11-03 LAB — BRAIN NATRIURETIC PEPTIDE: B Natriuretic Peptide: 793.8 pg/mL — ABNORMAL HIGH (ref 0.0–100.0)
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
## 2023-11-04 DIAGNOSIS — I739 Peripheral vascular disease, unspecified: Secondary | ICD-10-CM | POA: Diagnosis not present

## 2023-11-04 DIAGNOSIS — D62 Acute posthemorrhagic anemia: Secondary | ICD-10-CM | POA: Diagnosis not present

## 2023-11-04 DIAGNOSIS — Z794 Long term (current) use of insulin: Secondary | ICD-10-CM

## 2023-11-04 DIAGNOSIS — E119 Type 2 diabetes mellitus without complications: Secondary | ICD-10-CM

## 2023-11-04 DIAGNOSIS — Z89512 Acquired absence of left leg below knee: Secondary | ICD-10-CM | POA: Diagnosis not present

## 2023-11-04 DIAGNOSIS — R600 Localized edema: Secondary | ICD-10-CM

## 2023-11-04 LAB — BASIC METABOLIC PANEL WITH GFR
Anion gap: 10 (ref 5–15)
BUN: 26 mg/dL — ABNORMAL HIGH (ref 6–20)
CO2: 24 mmol/L (ref 22–32)
Calcium: 8.2 mg/dL — ABNORMAL LOW (ref 8.9–10.3)
Chloride: 104 mmol/L (ref 98–111)
Creatinine, Ser: 1.01 mg/dL — ABNORMAL HIGH (ref 0.44–1.00)
GFR, Estimated: >60 mL/min (ref >60)
Glucose, Bld: 100 mg/dL — ABNORMAL HIGH (ref 70–99)
Potassium: 3.7 mmol/L (ref 3.5–5.1)
Sodium: 138 mmol/L (ref 135–145)

## 2023-11-04 LAB — GLUCOSE, CAPILLARY
Glucose-Capillary: 96 mg/dL (ref 70–99)
Glucose-Capillary: 115 mg/dL — ABNORMAL HIGH (ref 70–99)
Glucose-Capillary: 116 mg/dL — ABNORMAL HIGH (ref 70–99)
Glucose-Capillary: 123 mg/dL — ABNORMAL HIGH (ref 70–99)

## 2023-11-04 LAB — CK: Total CK: 478 U/L — ABNORMAL HIGH (ref 38–234)

## 2023-11-04 LAB — CBC
HCT: 23.8 % — ABNORMAL LOW (ref 36.0–46.0)
Hemoglobin: 7 g/dL — ABNORMAL LOW (ref 12.0–15.0)
MCH: 27.6 pg (ref 26.0–34.0)
MCHC: 29.4 g/dL — ABNORMAL LOW (ref 30.0–36.0)
MCV: 93.7 fL (ref 80.0–100.0)
Platelets: 217 K/uL (ref 150–400)
RBC: 2.54 MIL/uL — ABNORMAL LOW (ref 3.87–5.11)
RDW: 17.8 % — ABNORMAL HIGH (ref 11.5–15.5)
WBC: 4.4 K/uL (ref 4.0–10.5)
nRBC: 0 % (ref 0.0–0.2)

## 2023-11-04 LAB — C-REACTIVE PROTEIN: CRP: 7.4 mg/dL — ABNORMAL HIGH (ref <1.0)

## 2023-11-04 LAB — SEDIMENTATION RATE: Sed Rate: 113 mm/h — ABNORMAL HIGH (ref 0–22)

## 2023-11-04 NOTE — Progress Notes (Signed)
 Occupational Therapy Session Note  Patient Details  Name: Kayla Schmitt MRN: [id number] Date of Birth: 1965/09/09  Today's Date: 11/04/2023 OT Individual Time: 9184-9074 OT Individual Time Calculation (min): 70 min    Short Term Goals: Week 1:  OT Short Term Goal 1 (Week 1): Pt will complete 2/3 toileting steps with CGA for balance OT Short Term Goal 2 (Week 1): Pt will complete sit > stand in prep for ADL with CGA OT Short Term Goal 3 (Week 1): Pt will complete toilet transfer with CGA  Skilled Therapeutic Interventions/Progress Updates:    Pt seated EOB upon arrival. OT intervention with focus on on LB dressing, functional transfers, d/c planning, BUE therex, w/c mobility, and activity tolerance to increase indepndence with BADLs. LB dressing with supervision seated EOB. Pt utilized lateral leans to don pants. Pt donned limb guard with supervisoin. Scoot transfer to w/c with supervision. Grooming at w/c level at sink. Pt propelled w/c to ortho gym with supervision. BUE therex on UBE 5 mins level 4 40 RPM. Pt demonstrated BUE therex with red theraband for UB strengthening to assist with w/c mobility and transfers. Discussed DME recommendations. Discussed bed height to facilitate easier transfers. Pt remained in w/c with all needs within reach.   Therapy Documentation Precautions:  Precautions Precautions: Fall Precaution/Restrictions Comments: L BKA with wound vac Restrictions Weight Bearing Restrictions Per Provider Order: Yes LLE Weight Bearing Per Provider Order: Non weight bearing Pain: Pt denies pain this morning   Therapy/Group: Individual Therapy  Maritza Debby Mare 11/04/2023, 9:30 AM

## 2023-11-04 NOTE — Plan of Care (Signed)
  Problem: Consults Goal: RH LIMB LOSS PATIENT EDUCATION Description: Description: See Patient Education module for eduction specifics. Outcome: Progressing   Problem: RH BOWEL ELIMINATION Goal: RH STG MANAGE BOWEL WITH ASSISTANCE Description: STG Manage Bowel with mod I Assistance. Outcome: Progressing Goal: RH STG MANAGE BOWEL W/MEDICATION W/ASSISTANCE Description: STG Manage Bowel with Medication with mod I Assistance. Outcome: Progressing   Problem: RH SAFETY Goal: RH STG ADHERE TO SAFETY PRECAUTIONS W/ASSISTANCE/DEVICE Description: STG Adhere to Safety Precautions With cues Assistance/Device. Outcome: Progressing   Problem: RH PAIN MANAGEMENT Goal: RH STG PAIN MANAGED AT OR BELOW PT'S PAIN GOAL Description: < 4 with prns Outcome: Progressing   Problem: RH KNOWLEDGE DEFICIT LIMB LOSS Goal: RH STG INCREASE KNOWLEDGE OF SELF CARE AFTER LIMB LOSS Description: Patient and mother will be able to manage care at discharge using educational resources for medications, skin care, and dietary modification independently Outcome: Progressing

## 2023-11-04 NOTE — Progress Notes (Signed)
 Physical Therapy Session Note  Patient Details  Name: Kayla Schmitt MRN: [id number] Date of Birth: Apr 01, 1966  Today's Date: 11/04/2023 PT Individual Time: 8593-8554 PT Individual Time Calculation (min): 39 min   Short Term Goals: Week 1:  PT Short Term Goal 1 (Week 1): pt will transfer bed<>chair with LRAD and CGA PT Short Term Goal 2 (Week 1): pt will transfer sit<>stand with LRAD and CGA PT Short Term Goal 3 (Week 1): pt will perform simulated car transfer with LRAD and CGA  Skilled Therapeutic Interventions/Progress Updates:    Pt presents in room seated in [facility], motivated to participate with PT, denies pain. Session focused on therapeutic activities for activity tolerance, education on transfers for car and managing driving at DC, transfer training, and NMR for BUE/RLE strengthening. Pt self propels WC modI throughout session, demonstrating good navigation of narrow spaces and parking WC for transfer. Pt able to manage donning L limb protector with supervision for fit. Pt completes lateral transfer with supervision, therapist holding WC in place throughout session, pt comes to sitting on mat at 21 height. Pt completes mass practice sit<>stand with CGA for holding RW in place x10. Pt takes extended seated rest break during which therapist discusses return to ambulation, custom WC vs prosthesis, managing WC for car transfers, and driving with pt vebralizing understanidng. Pt completes NMR in standing with BUE support on RW to promote strengthening for return to ambulation including: - mini squats x10 (cues for BUE/RLE desired muscle activation) - lat press ups on RW x10 (pt unable to clear RLE for lat press up however demonstrating good muscle activation with cueing) Pt returns to room and remains seated in [facility] with all needs within reach, cal light in place and pt mother at bedside at end of session.   Therapy Documentation Precautions:  Precautions Precautions:  Fall Precaution/Restrictions Comments: L BKA with wound vac Restrictions Weight Bearing Restrictions Per Provider Order: Yes LLE Weight Bearing Per Provider Order: Non weight bearing   Therapy/Group: Individual Therapy  Reche Ohara PT, DPT 11/04/2023, 5:54 PM

## 2023-11-04 NOTE — Progress Notes (Addendum)
 Nutrition Follow-up  DOCUMENTATION CODES:   Not applicable  INTERVENTION:   Juven BID, each packet provides 80 calories, 8 grams of carbohydrate, 2.5  grams of protein (collagen), 7 grams of L-arginine and 7 grams of L-glutamine; supplement contains CaHMB, Vitamins C, E, B12 and Zinc  to promote wound healing.  MVI with minerals daily.  HS snack daily.  Continue zinc  sulfate 200 mg daily x 14 days and Vitamin C  250 mg daily for wound healing.  NUTRITION DIAGNOSIS:   Increased nutrient needs related to post-op healing, wound healing as evidenced by estimated needs. Ongoing   GOAL:   Patient will meet greater than or equal to 90% of their needs Progressing   MONITOR:   PO intake, Supplement acceptance, Skin  REASON FOR ASSESSMENT:   Consult Diet education, Wound healing  ASSESSMENT:   58 yo female admitted with functional deficits d/t L BKA. PMH includes R TMA, HTN, HLD, DM, hypothyroidism, GERD, CAD, PVD, mitral regurgitation, aortic stenosis, MI, CHF, anemia.  S/P L BKA 6/24. S/P L BKA revision 6/25. VAC in place. Currently on a heart healthy carb modified diet , consuming 100% of meals.   Patient states she is eating very well. She eats what she wants from the hospital trays and her mom supplements with food from the cafeteria. She does not like the Prosource Plus supplement, so it was discontinued. She does like the Juven, drinking it BID.   Labs reviewed.  CBG: [id number] Vitamin D , 25-Hydroxy 50.60 WNL (10/31/23)  Medications reviewed and include vitamin C , vitamin D3, ferrous sulfate , lasix , novolog , semglee , protonix , miralax , senokot-s, zinc  sulfate. Supplements: Juven BID   Weights: 6/17: 94.1 kg 6/30: 104.8 kg (up d/t swelling s/p amputation) 7/01: 95.3 kg 7/07: 98.9 kg  NUTRITION - FOCUSED PHYSICAL EXAM:  Flowsheet Row Most Recent Value  Orbital Region No depletion  Upper Arm Region No depletion  Thoracic and Lumbar Region No depletion   Buccal Region Mild depletion  Temple Region No depletion  Clavicle Bone Region No depletion  Clavicle and Acromion Bone Region No depletion  Scapular Bone Region No depletion  Dorsal Hand No depletion  Patellar Region No depletion  Anterior Thigh Region No depletion  Posterior Calf Region No depletion  Edema (RD Assessment) Severe  [LLE]  Hair Reviewed  Eyes Reviewed  Mouth Reviewed  Skin Reviewed  Nails Reviewed    Diet Order:   Diet Order             Diet heart healthy/carb modified Room service appropriate? Yes; Fluid consistency: Thin  Diet effective now                   EDUCATION NEEDS:   Education needs have been addressed  Skin:  Skin Assessment: Skin Integrity Issues: Skin Integrity Issues:: DTI DTI: R heel Stage I: - Wound Vac: L BKA site  Last BM:  7/6  Height:   Ht Readings from Last 1 Encounters:  10/29/23 5' 4 (1.626 m)    Weight:   Wt Readings from Last 1 Encounters:  11/04/23 98.9 kg    Estimated Nutritional Needs:   Kcal:  1700-1900  Protein:  100-120 gm  Fluid:  >/= 1.8 L   Suzen HUNT RD, LDN, CNSC Contact via secure chat. If unavailable, use group chat RD Inpatient.

## 2023-11-04 NOTE — Plan of Care (Signed)
  Problem: Consults Goal: RH LIMB LOSS PATIENT EDUCATION Description: Description: See Patient Education module for eduction specifics. Outcome: Progressing   Problem: RH BOWEL ELIMINATION Goal: RH STG MANAGE BOWEL WITH ASSISTANCE Description: STG Manage Bowel with mod I Assistance. Outcome: Progressing Goal: RH STG MANAGE BOWEL W/MEDICATION W/ASSISTANCE Description: STG Manage Bowel with Medication with mod I Assistance. Outcome: Progressing   Problem: RH SAFETY Goal: RH STG ADHERE TO SAFETY PRECAUTIONS W/ASSISTANCE/DEVICE Description: STG Adhere to Safety Precautions With cues Assistance/Device. Outcome: Progressing   Problem: RH PAIN MANAGEMENT Goal: RH STG PAIN MANAGED AT OR BELOW PT'S PAIN GOAL Description: < 4 with prns Outcome: Progressing   Problem: RH KNOWLEDGE DEFICIT LIMB LOSS Goal: RH STG INCREASE KNOWLEDGE OF SELF CARE AFTER LIMB LOSS Description: Patient and mother will be able to manage care at discharge using educational resources for medications, skin care, and dietary modification independently Outcome: Progressing   Problem: Consults Goal: RH LIMB LOSS PATIENT EDUCATION Description: Description: See Patient Education module for eduction specifics. Outcome: Progressing   Problem: RH BOWEL ELIMINATION Goal: RH STG MANAGE BOWEL WITH ASSISTANCE Description: STG Manage Bowel with mod I Assistance. Outcome: Progressing   Problem: RH BOWEL ELIMINATION Goal: RH STG MANAGE BOWEL W/MEDICATION W/ASSISTANCE Description: STG Manage Bowel with Medication with mod I Assistance. Outcome: Progressing   Problem: RH SAFETY Goal: RH STG ADHERE TO SAFETY PRECAUTIONS W/ASSISTANCE/DEVICE Description: STG Adhere to Safety Precautions With cues Assistance/Device. Outcome: Progressing   Problem: RH PAIN MANAGEMENT Goal: RH STG PAIN MANAGED AT OR BELOW PT'S PAIN GOAL Description: < 4 with prns Outcome: Progressing   Problem: RH KNOWLEDGE DEFICIT LIMB LOSS Goal: RH STG  INCREASE KNOWLEDGE OF SELF CARE AFTER LIMB LOSS Description: Patient and mother will be able to manage care at discharge using educational resources for medications, skin care, and dietary modification independently Outcome: Progressing
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
## 2023-11-04 NOTE — Progress Notes (Signed)
 Physical Therapy Session Note  Patient Details  Name: Kayla Schmitt MRN: [id number] Date of Birth: 1965-07-11  Today's Date: 11/04/2023 PT Individual Time: 1035-1100 PT Individual Time Calculation (min): 25 min   Today's Date: 11/04/2023 PT Individual Time: 1550-1630 PT Individual Time Calculation (min): 40 min   Short Term Goals: Week 1:  PT Short Term Goal 1 (Week 1): pt will transfer bed<>chair with LRAD and CGA PT Short Term Goal 2 (Week 1): pt will transfer sit<>stand with LRAD and CGA PT Short Term Goal 3 (Week 1): pt will perform simulated car transfer with LRAD and CGA  Skilled Therapeutic Interventions/Progress Updates:     1st Session: Pt received seated in [facility] and agrees to therapy. Reports slight pain IN LLE. Number not provided. PT provides rest breaks as needed to manage pain. Pt self propels WC to gym with BUEs to work on endurance and mobility training. Pt performs squat pivot to mat table with CGA and cues for sequencing and positioning. Pt performs sit to supine with cues for sequencing. Pt completes single leg bridges with RLE and cues for body mechanics, x10. Pt completes x15 quad sets, glute sets, SLRs, and hip ab/adduction. Pt performs sit to supine with cues for positioning. Squat pivot back to [facility] with same cues and assistance. Left seated in WC with all needs within reach.    2nd Session: Pt received seated in [facility] and agrees to therapy. No complaint of pain. Pt self propels WC to gym with BUEs to work on endurance and mobility training. Pt performs squat pivot to mat table with CGA and cues for sequencing. Pt stands with CGA and completes x20 heel raises to strengthen calf muscles and dorsiflexors. Pt unable to clear heel but palpable contractions noted in calf muscles. Pt then performs cornhole activity to challenge standing balance and coordination, as well as endurance. Pt provides minA due to slight posterior bias throughout, with cues for posture and body  mechanics. Pt performs multiple reps of sit to stand during activity. Pt then completes 3x10 situps with blue wedge placed behind pt and pt holding 3lb bar in BUEs, hitting ball at top of each rep to challenge coordination. Pt performs squat pivot back to WC with same cues and assistnace. Left seated in WC with all needs within reach.   Therapy Documentation Precautions:  Precautions Precautions: Fall Precaution/Restrictions Comments: L BKA with wound vac Restrictions Weight Bearing Restrictions Per Provider Order: Yes LLE Weight Bearing Per Provider Order: Non weight bearing   Therapy/Group: Individual Therapy  Elsie JAYSON Dawn, PT, DPT 11/04/2023, 4:38 PM

## 2023-11-05 ENCOUNTER — Inpatient Hospital Stay (HOSPITAL_COMMUNITY)

## 2023-11-05 DIAGNOSIS — D62 Acute posthemorrhagic anemia: Secondary | ICD-10-CM | POA: Diagnosis not present

## 2023-11-05 DIAGNOSIS — E119 Type 2 diabetes mellitus without complications: Secondary | ICD-10-CM | POA: Diagnosis not present

## 2023-11-05 DIAGNOSIS — I739 Peripheral vascular disease, unspecified: Secondary | ICD-10-CM | POA: Diagnosis not present

## 2023-11-05 DIAGNOSIS — Z89512 Acquired absence of left leg below knee: Secondary | ICD-10-CM | POA: Diagnosis not present

## 2023-11-05 LAB — GLUCOSE, CAPILLARY
Glucose-Capillary: 103 mg/dL — ABNORMAL HIGH (ref 70–99)
Glucose-Capillary: 139 mg/dL — ABNORMAL HIGH (ref 70–99)
Glucose-Capillary: 111 mg/dL — ABNORMAL HIGH (ref 70–99)
Glucose-Capillary: 182 mg/dL — ABNORMAL HIGH (ref 70–99)

## 2023-11-05 LAB — HEMOGLOBIN AND HEMATOCRIT, BLOOD
HCT: 23.3 % — ABNORMAL LOW (ref 36.0–46.0)
Hemoglobin: 7.1 g/dL — ABNORMAL LOW (ref 12.0–15.0)

## 2023-11-05 LAB — OCCULT BLOOD X 1 CARD TO LAB, STOOL: Fecal Occult Bld: NEGATIVE

## 2023-11-05 MED ORDER — FUROSEMIDE 10 MG/ML IJ SOLN
40.0000 mg | Freq: Once | INTRAMUSCULAR | Status: AC
  Administered 2023-11-05: 40 mg via INTRAVENOUS
  Filled 2023-11-05: qty 4

## 2023-11-05 MED ORDER — POTASSIUM CHLORIDE CRYS ER 20 MEQ PO TBCR
40.0000 meq | EXTENDED_RELEASE_TABLET | Freq: Once | ORAL | Status: AC
  Administered 2023-11-05: 40 meq via ORAL
  Filled 2023-11-05: qty 2

## 2023-11-05 NOTE — Progress Notes (Signed)
 Stool sample collected and sent to lab.   Tilden Dome, LPN

## 2023-11-05 NOTE — Progress Notes (Signed)
 Upon beginning of shift, pt c/o shortness of breath. Pt observed laying in bed O2 Sat checked resulting in 86% on room air. The rest of pt vitals are stable. Lungs sounds diminished but clear and pt denies any pain at this time. On call provider Sharlet Schmitz notified and received order for STAT CXR and PRN O2 to keep Sats above 90%. Pt informed of new orders ,  O2 Sat at 93% on 2L [place] at this time.

## 2023-11-05 NOTE — Progress Notes (Addendum)
 Patient ID: Kayla Schmitt, female   DOB: 11-27-1965, 58 y.o.   MRN: [id number]  Received STD paperwork from [facility] have faxed in and will give originals to pt for her file.   9:49 AM ordered rolling walker via [employer]  1:39 PM [facility] to provide [facility] and [facility] to provide the therapy follow up. Update after team conference tomorrow.

## 2023-11-05 NOTE — Progress Notes (Signed)
 PROGRESS NOTE   Subjective/Complaints: Pt out in therapy again. No new issues. Pain seems controlled. Vac sealed.   ROS: Patient denies fever, rash, sore throat, blurred vision, dizziness, nausea, vomiting, diarrhea, cough, shortness of breath or chest pain, joint or back/neck pain, headache, or mood change.   Objective:   No results found.  Recent Labs    11/03/23 0112 11/04/23 0500 11/05/23 0330  WBC 6.4 4.4  --   HGB 7.5* 7.0* 7.1*  HCT 23.8* 23.8* 23.3*  PLT 229 217  --    Recent Labs    11/03/23 0112 11/04/23 0500  NA 137 138  K 4.0 3.7  CL 106 104  CO2 25 24  GLUCOSE 140* 100*  BUN 25* 26*  CREATININE 1.11* 1.01*  CALCIUM  8.2* 8.2*     Intake/Output Summary (Last 24 hours) at 11/05/2023 1059 Last data filed at 11/05/2023 0700 Gross per 24 hour  Intake 360 ml  Output --  Net 360 ml        Physical Exam: Vital Signs Blood pressure 133/63, pulse 80, temperature 98.4 F (36.9 C), resp. rate 18, height 5' 4 (1.626 m), weight 98.9 kg, SpO2 99%. Constitutional: No distress . Vital signs reviewed. HEENT: NCAT, EOMI, oral membranes moist Neck: supple Cardiovascular: RRR without murmur. No JVD    Respiratory/Chest: CTA Bilaterally without wheezes or rales. Normal effort    GI/Abdomen: BS +, non-tender, non-distended Ext: no clubbing, cyanosis, 1++ RLE edema, has TED on Psych: pleasant and cooperative  Musculoskeletal:     Cervical back: Neck supple. No tenderness.      Skin:    General: Skin is warm and dry.     Comments: Small scabbed area latera aspect of right transmetatarsal site    Left BK still in VAC with  seal no drainage in cannister . broken blister- large ~ 3 inches in diameter on medial posterior aspect of L thigh from LLE limb guard L posterior/medial above knee/on thigh- rubbed area from limb guard- abraded skin in shape of top of limb guard --resolving   Neurological:     Mental  Status: She is alert and oriented to person, place, and time.     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 Functional deficits which require 3+ hours per day of interdisciplinary therapy in a comprehensive inpatient rehab setting. Physiatrist is providing close team supervision and 24 hour management of active medical problems listed below. Physiatrist and rehab team continue to assess barriers to discharge/monitor patient progress toward functional and medical goals  Care Tool:  Bathing    Body parts bathed by patient: Right arm, Left arm, Chest, Abdomen, Left upper leg, Face, Front perineal area, Buttocks, Right upper leg, Right lower leg   Body parts bathed by helper: Right lower leg, Front perineal area, Buttocks Body parts n/a: Left lower leg   Bathing assist Assist Level: Supervision/Verbal cueing     Upper Body Dressing/Undressing Upper body dressing  What is the patient wearing?: Pull over shirt    Upper body assist Assist Level: Set up assist    Lower Body Dressing/Undressing Lower body dressing      What is the patient wearing?: Underwear/pull up, Pants     Lower body assist Assist for lower body dressing: Minimal Assistance - Patient > 75%     Toileting Toileting    Toileting assist Assist for toileting: Maximal Assistance - Patient 25 - 49%     Transfers Chair/bed transfer  Transfers assist     Chair/bed transfer assist level: Contact Guard/Touching assist     Locomotion Ambulation   Ambulation assist   Ambulation activity did not occur: Safety/medical concerns (pain, decreased balance, wound on R heel)          Walk 10 feet activity   Assist  Walk 10 feet activity did not occur: Safety/medical concerns (pain, decreased balance, wound on R heel)         Walk 50 feet activity   Assist Walk 50 feet with 2 turns activity did not occur: Safety/medical concerns (pain, decreased balance, wound on R heel)         Walk 150 feet activity   Assist Walk 150 feet activity did not occur: Safety/medical concerns (pain, decreased balance, wound on R heel)         Walk 10 feet on uneven surface  activity   Assist Walk 10 feet on uneven surfaces activity did not occur: Safety/medical concerns (pain, decreased balance, wound on R heel)         Wheelchair     Assist Is the patient using a wheelchair?: Yes Type of Wheelchair: Manual    Wheelchair assist level: Independent Max wheelchair distance: 75    Wheelchair 50 feet with 2 turns activity    Assist        Assist Level: Independent   Wheelchair 150 feet activity     Assist      Assist Level: Independent   Blood pressure 133/63, pulse 80, temperature 98.4 F (36.9 C), resp. rate 18, height 5' 4 (1.626 m), weight 98.9 kg, SpO2 99%.   Medical Problem List and Plan: 1. 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  Antithrombotics: -DVT/anticoagulation:  Pharmaceutical: continue Lovenox              -antiplatelet therapy: continue plavix .   3. Pain Management: continue Oxycodone  prn.   -on gabapentin  200mg  tid for phantom pain, RLS  -pain seems controlled 4. Mood/Behavior/Sleep: LCSW to follow for evaluation and support.               -antipsychotic agents: n/a 5. Neuropsych/cognition: This patient is capable of making decisions on her own behalf. 6. Skin/Wound Care: Pressure relief measures. Protein supplements added. Monitor for recurrent drainage.             --foam dressing to area on left thigh.    -remove vac tomorrow 7/9  -local care to other wounds 7. Fluids/Electrolytes/Nutrition: Monitor I/O.   8.  MRSA bacteremia: Last CK 522-->continue Daptomycin  with EOT  11/17/23             --  weekly CRP, Sed rate, CPK in addition to CBC/BMP.             --follow up with Dr. Dea 07/17 @ 9:30 am prior to EOT. Zinc , Vitamin C , Vitamin D  supplements started  -afebrile, clinically stable 9. T2DM on chronic insulin : Hgb A1C- 5.5 and well controlled (down from 6.4 earlier this year). Monitor BS ac/hs and use SSI for elevated BS.              -continue insulin  glargine 10 units and SSI was added today  D/c HS SSI, improved control over all. ---no changes 7/8  CBG (last 3)  Recent Labs    11/04/23 1634 11/04/23 2204 11/05/23 0604  GLUCAP 116* 123* 103*    10. Severe PAD s/p stents: On Plavix  and atorvastatin .  Monitor chronically draining area rea on right transmet site.   11. 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   -7/7 hgb down to 7.0---asymptomatic  -stools recently heme negative  -7/8 hgb 7.1, clinically stable. No changes to plan  -repeat labs Thursday   12.  CAD/HFpEF w/EF  - EF 60-65%: Elevated troponin felt to be due to demand ischemia --Down to 501 on 06/23.   Lasix  40mg  BID restarted Saturday for swelling pulmonary edema  RLE edema-RLE TED stocking f   -lasix  as above.   -gabapentin  reduced to 200mg  tid  13.   Mod to severe Aortic stenosis/Moderate MR: Strict I/O w/daily weights. Monitor for signs of overload. Lasix  40mg  BID restarted  14.  Severe malnutrition: Albumin  level <1.5 w/ calcium  7.6-->corrects to 9.5. RD consulted  -improvement here will also help swelling.  15.  Abnormal LFTs: resolved  18. AKI: continue to monitor creatinine given increase in lasix   -7/7 holding for now--recheck Thursday   LOS: 7 days A FACE TO FACE EVALUATION WAS PERFORMED  Arthea ONEIDA Gunther 11/05/2023, 10:59 AM

## 2023-11-05 NOTE — Progress Notes (Signed)
 Occupational Therapy Session Note  Patient Details  Name: Kayla Schmitt MRN: [id number] Date of Birth: [birth year]  Today's Date: 11/05/2023 OT Individual Time: 1405-1500 OT Individual Time Calculation (min): 55 min    Short Term Goals: Week 1:  OT Short Term Goal 1 (Week 1): Pt will complete 2/3 toileting steps with CGA for balance OT Short Term Goal 2 (Week 1): Pt will complete sit > stand in prep for ADL with CGA OT Short Term Goal 3 (Week 1): Pt will complete toilet transfer with CGA  Skilled Therapeutic Interventions/Progress Updates:  Skilled OT intervention completed with focus on DC planning, shower transfers and education on different surfaces with various heights. Pt received seated in w/c, agreeable to session. No pain reported.  Nurse present to disconnect pt from IV. Donned limb guard independently. Self-propelled in w/c > ADL bathroom. Pt has walk in shower with curtain, 6 in lip, and plans to purchase TTB as previously discussed. Pt was able to verbalize her bathroom set up and simulate accessing bathroom via w/c and transferring via lateral scoot <> TTB with CGA/supervision. Cues needed for proper w/c positioning next to TTB for safety and efficient locking of brakes. Discussed shower curtain tuck method under buttocks, installing grab bars and options for storing toiletries for increased independence.  [name] provided handout of measurement heights for commonly used surfaces. OT simulated via mat table, the bed height of 23 which was deemed unsafe to transfer onto laterally due to steep incline. Mother to double check if box spring is present and/or legs are adjustable but discussed option of getting more narrow mattress or using couch to sleep for time being. Couch recorded as 18 which is level to w/c however discussed cushions sinking pt's seated height further. Advised pt to sit on one of couch with arm rest or get attachable grab bar on [employer] for couch to  increase leverage with lateral scoot/squat pivot <> w/c.  Back in room, pt practiced squat pivot (series) from w/c <> standard toilet. Current toilet is 18 however one at home is 20. Discussed safety considerations with the gap of space and suggested pt install grab bars to the R side of toilet due to her dependency on it. Cues needed for transfer.  Pt remained seated in w/c, with mother present and with all needs in reach at end of session.   Therapy Documentation Precautions:  Precautions Precautions: Fall Precaution/Restrictions Comments: L BKA with wound vac Restrictions Weight Bearing Restrictions Per Provider Order: Yes LLE Weight Bearing Per Provider Order: Non weight bearing    Therapy/Group: Individual Therapy  Lorrayne FORBES Fritter, MS, OTR/L  11/05/2023, 3:50 PM

## 2023-11-05 NOTE — Progress Notes (Signed)
 Physical Therapy Session Note  Patient Details  Name: Kayla Schmitt MRN: [id number] Date of Birth: November 28, 1965   Today's Date: 11/05/2023 PT Individual Time: 9195-9084 PT Individual Time Calculation (min): 71 min   Today's Date: 11/05/2023 PT Individual Time: 1520-1630 PT Individual Time Calculation (min): 70 min   Short Term Goals: Week 1:  PT Short Term Goal 1 (Week 1): pt will transfer bed<>chair with LRAD and CGA PT Short Term Goal 2 (Week 1): pt will transfer sit<>stand with LRAD and CGA PT Short Term Goal 3 (Week 1): pt will perform simulated car transfer with LRAD and CGA  Skilled Therapeutic Interventions/Progress Updates:     Pt received seated in [facility] and agrees to therapy. Reports some pain in Lt residual limb. RN present to provide pain medicine and PT provides mobility and rest breaks to manage pain. PT assists to don RLE knee high TED hose prior to mobility. Pt self propels WC to gym with BUEs to work on endurance and mobility training. Squat pivot to mat table with close supervision and cues for positioning and body mechanics. Mat table raises ~3 inches to allow pt to perform sit to stand to standing scale to record bodyweight. PT provides CGA for transfer with cues for hand placement. Pt transitions to semi reclined on wedge to perform therapeutic exercises for strengthening of BLEs. Pt performs 2x15 quad sets, glute sets, and SAQs, and 2x10 SLRs, hip ab/adduction and single leg bridges with RLE. PT provides verbal and tactile cues throughout for NM feedback and correct performance. Pt performs supine to sit independently. Squat pivot back to [facility] with cues for body mechanics. Pt self propels WC 2x175' with BUEs for endurance training. Pt left seated in WC with all needs within reach.  2nd Session: Pt received seated in [facility] and agrees to therapy. No complaint of pain. Pt self propels WC to gym with BUEs to work on endurance and mobility training. Pt completes Wii bowling activity  to challenge activity tolerance, standing balance, transfer training, and fine and gross motor coordination. Pt requires minA for sit to stand from [facility] with cues for anterior weight shifting and hand placement. Pt has x2 slight LOBs while participating in activity, requiring minA for stability and cues for safety. Following, pt performs squat pivot to Nustep with CGA and cues for positioning and body mechanics. Pt completes Nustep for endurance training. Pt completes total of x15:00 at workload of 4 with average step per minute 60, with multiple short seated rest breaks. PT provides cues for hand and foot placement and completing full available ROM. Squat pivot from Nusteo>WC>bed with CGA and same cues. Left seated at EOB with call bell in reach.   Therapy Documentation Precautions:  Precautions Precautions: Fall Precaution/Restrictions Comments: L BKA with wound vac Restrictions Weight Bearing Restrictions Per Provider Order: Yes LLE Weight Bearing Per Provider Order: Non weight bearing    Therapy/Group: Individual Therapy  Elsie JAYSON Dawn, PT, DPT 11/05/2023, 5:03 PM

## 2023-11-05 NOTE — Progress Notes (Signed)
 Patient with SOB and hypoxia tonight. Note episode  on 07/05 when lasix  was resumed. Weight is up by 5-7 lbs?. Will administer lasix  40 mg IV x 1 with Kdur 40 meq po. Continue to monitor weight/symptoms. Recheck BMET in am.

## 2023-11-05 NOTE — Plan of Care (Signed)
  Problem: Consults Goal: RH LIMB LOSS PATIENT EDUCATION Description: Description: See Patient Education module for eduction specifics. Outcome: Progressing   Problem: RH BOWEL ELIMINATION Goal: RH STG MANAGE BOWEL WITH ASSISTANCE Description: STG Manage Bowel with mod I Assistance. Outcome: Progressing Goal: RH STG MANAGE BOWEL W/MEDICATION W/ASSISTANCE Description: STG Manage Bowel with Medication with mod I Assistance. Outcome: Progressing   Problem: RH SAFETY Goal: RH STG ADHERE TO SAFETY PRECAUTIONS W/ASSISTANCE/DEVICE Description: STG Adhere to Safety Precautions With cues Assistance/Device. Outcome: Progressing   Problem: RH PAIN MANAGEMENT Goal: RH STG PAIN MANAGED AT OR BELOW PT'S PAIN GOAL Description: < 4 with prns Outcome: Progressing   Problem: RH KNOWLEDGE DEFICIT LIMB LOSS Goal: RH STG INCREASE KNOWLEDGE OF SELF CARE AFTER LIMB LOSS Description: Patient and mother will be able to manage care at discharge using educational resources for medications, skin care, and dietary modification independently Outcome: Progressing   Problem: RH BOWEL ELIMINATION Goal: RH STG MANAGE BOWEL WITH ASSISTANCE Description: STG Manage Bowel with mod I Assistance. Outcome: Progressing   Problem: RH BOWEL ELIMINATION Goal: RH STG MANAGE BOWEL W/MEDICATION W/ASSISTANCE Description: STG Manage Bowel with Medication with mod I Assistance. Outcome: Progressing   Problem: RH SAFETY Goal: RH STG ADHERE TO SAFETY PRECAUTIONS W/ASSISTANCE/DEVICE Description: STG Adhere to Safety Precautions With cues Assistance/Device. Outcome: Progressing   Problem: RH PAIN MANAGEMENT Goal: RH STG PAIN MANAGED AT OR BELOW PT'S PAIN GOAL Description: < 4 with prns Outcome: Progressing   Problem: RH KNOWLEDGE DEFICIT LIMB LOSS Goal: RH STG INCREASE KNOWLEDGE OF SELF CARE AFTER LIMB LOSS Description: Patient and mother will be able to manage care at discharge using educational resources for  medications, skin care, and dietary modification independently Outcome: Progressing

## 2023-11-06 DIAGNOSIS — E8779 Other fluid overload: Secondary | ICD-10-CM

## 2023-11-06 LAB — GLUCOSE, CAPILLARY
Glucose-Capillary: 116 mg/dL — ABNORMAL HIGH (ref 70–99)
Glucose-Capillary: 117 mg/dL — ABNORMAL HIGH (ref 70–99)
Glucose-Capillary: 158 mg/dL — ABNORMAL HIGH (ref 70–99)
Glucose-Capillary: 141 mg/dL — ABNORMAL HIGH (ref 70–99)

## 2023-11-06 LAB — BASIC METABOLIC PANEL WITH GFR
Anion gap: 7 (ref 5–15)
BUN: 28 mg/dL — ABNORMAL HIGH (ref 6–20)
CO2: 26 mmol/L (ref 22–32)
Calcium: 8 mg/dL — ABNORMAL LOW (ref 8.9–10.3)
Chloride: 103 mmol/L (ref 98–111)
Creatinine, Ser: 1.14 mg/dL — ABNORMAL HIGH (ref 0.44–1.00)
GFR, Estimated: 56 mL/min — ABNORMAL LOW (ref >60)
Glucose, Bld: 150 mg/dL — ABNORMAL HIGH (ref 70–99)
Potassium: 3.8 mmol/L (ref 3.5–5.1)
Sodium: 136 mmol/L (ref 135–145)

## 2023-11-06 MED ORDER — ENSURE MAX PROTEIN PO LIQD
11.0000 [oz_av] | Freq: Every day | ORAL | Status: DC
  Administered 2023-11-06: 11 [oz_av] via ORAL

## 2023-11-06 MED ORDER — FUROSEMIDE 10 MG/ML IJ SOLN
40.0000 mg | Freq: Once | INTRAMUSCULAR | Status: AC
  Administered 2023-11-06: 40 mg via INTRAVENOUS
  Filled 2023-11-06: qty 4

## 2023-11-06 MED ORDER — POTASSIUM CHLORIDE CRYS ER 20 MEQ PO TBCR
20.0000 meq | EXTENDED_RELEASE_TABLET | Freq: Two times a day (BID) | ORAL | Status: DC
  Administered 2023-11-06 – 2023-11-12 (×13): 20 meq via ORAL
  Filled 2023-11-06 (×14): qty 1

## 2023-11-06 NOTE — Progress Notes (Signed)
 PROGRESS NOTE   Subjective/Complaints: She feels better after IV lasix  last night. Worked really hard in therapy on bike in afternoon and wonders if that contributed. Pain controlled  ROS: Patient denies fever, rash, sore throat, blurred vision, dizziness, nausea, vomiting, diarrhea,  chest pain, joint or back/neck pain, headache, or mood change.   Objective:   DG Chest 2 View Result Date: 11/05/2023 CLINICAL DATA:  Shortness of breath EXAM: CHEST - 2 VIEW COMPARISON:  11/02/2023 FINDINGS: Left PICC line is in place. The catheter now courses into the right innominate vein with the tip likely in the right internal jugular vein. Cardiomegaly, vascular congestion. Diffuse bilateral airspace disease, worsening since prior study. No effusions or pneumothorax. No acute bony abnormality. IMPRESSION: Left PICC line now courses into the right innominate vein with the tip in the right internal jugular vein. Worsening diffuse bilateral airspace disease, likely edema although infection not excluded. Electronically Signed   By: Franky Crease M.D.   On: 11/05/2023 20:30    Recent Labs    11/04/23 0500 11/05/23 0330  WBC 4.4  --   HGB 7.0* 7.1*  HCT 23.8* 23.3*  PLT 217  --    Recent Labs    11/04/23 0500 11/06/23 0246  NA 138 136  K 3.7 3.8  CL 104 103  CO2 24 26  GLUCOSE 100* 150*  BUN 26* 28*  CREATININE 1.01* 1.14*  CALCIUM  8.2* 8.0*     Intake/Output Summary (Last 24 hours) at 11/06/2023 0834 Last data filed at 11/06/2023 0755 Gross per 24 hour  Intake 716 ml  Output --  Net 716 ml        Physical Exam: Vital Signs Blood pressure 138/64, pulse 86, temperature 98.3 F (36.8 C), resp. rate 19, height 5' 4 (1.626 m), weight (P) 98.3 kg, SpO2 95%. Constitutional: No distress . Vital signs reviewed. HEENT: NCAT, EOMI, oral membranes moist Neck: supple Cardiovascular: RRR without murmur. No JVD    Respiratory/Chest: CTA  Bilaterally really without wheezes or rales. Normal effort    GI/Abdomen: BS +, non-tender, non-distended Ext: no clubbing, cyanosis, 1+ RLE edema Psych: pleasant and cooperative  Musculoskeletal:     Cervical back: Neck supple. No tenderness.      Skin:    General: Skin is warm and dry.     Comments: Small scabbed area latera aspect of right transmetatarsal site    Left BK still in VAC with  seal no drainage in cannister . broken blister- large ~ 3 inches in diameter on medial posterior aspect of L thigh from LLE limb guard L posterior/medial above knee/on thigh- rubbed area from limb guard- abraded skin in shape of top of limb guard --improving.    Neurological:     Mental Status: She is alert and oriented to person, place, and time.     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      Assessment/Plan: 1. Functional deficits which require 3+ hours per day of interdisciplinary therapy in a comprehensive  inpatient rehab setting. Physiatrist is providing close team supervision and 24 hour management of active medical problems listed below. Physiatrist and rehab team continue to assess barriers to discharge/monitor patient progress toward functional and medical goals  Care Tool:  Bathing    Body parts bathed by patient: Right arm, Left arm, Chest, Abdomen, Left upper leg, Face, Front perineal area, Buttocks, Right upper leg, Right lower leg   Body parts bathed by helper: Right lower leg, Front perineal area, Buttocks Body parts n/a: Left lower leg   Bathing assist Assist Level: Supervision/Verbal cueing     Upper Body Dressing/Undressing Upper body dressing   What is the patient wearing?: Pull over shirt    Upper body assist Assist Level: Set up assist    Lower Body Dressing/Undressing Lower body dressing      What is the  patient wearing?: Underwear/pull up, Pants     Lower body assist Assist for lower body dressing: Minimal Assistance - Patient > 75%     Toileting Toileting    Toileting assist Assist for toileting: Maximal Assistance - Patient 25 - 49%     Transfers Chair/bed transfer  Transfers assist     Chair/bed transfer assist level: Contact Guard/Touching assist     Locomotion Ambulation   Ambulation assist   Ambulation activity did not occur: Safety/medical concerns (pain, decreased balance, wound on R heel)          Walk 10 feet activity   Assist  Walk 10 feet activity did not occur: Safety/medical concerns (pain, decreased balance, wound on R heel)        Walk 50 feet activity   Assist Walk 50 feet with 2 turns activity did not occur: Safety/medical concerns (pain, decreased balance, wound on R heel)         Walk 150 feet activity   Assist Walk 150 feet activity did not occur: Safety/medical concerns (pain, decreased balance, wound on R heel)         Walk 10 feet on uneven surface  activity   Assist Walk 10 feet on uneven surfaces activity did not occur: Safety/medical concerns (pain, decreased balance, wound on R heel)         Wheelchair     Assist Is the patient using a wheelchair?: Yes Type of Wheelchair: Manual    Wheelchair assist level: Independent Max wheelchair distance: 75    Wheelchair 50 feet with 2 turns activity    Assist        Assist Level: Independent   Wheelchair 150 feet activity     Assist      Assist Level: Independent   Blood pressure 138/64, pulse 86, temperature 98.3 F (36.8 C), resp. rate 19, height 5' 4 (1.626 m), weight (P) 98.3 kg, SpO2 95%.   Medical Problem List and Plan: 1. 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 Interdisciplinary team conference today to discuss goals, barriers to discharge, and dc planning.   Grounds pass ordered     2.  Antithrombotics: -DVT/anticoagulation:  Pharmaceutical: continue Lovenox              -antiplatelet therapy: continue plavix .  3. Pain Management: continue Oxycodone  prn.   -on gabapentin  200mg  tid for phantom pain, RLS  -pain seems controlled 4. Mood/Behavior/Sleep: LCSW to follow for evaluation and support.               -antipsychotic agents: n/a 5. Neuropsych/cognition: This patient is capable of making decisions on her own behalf. 6. Skin/Wound Care: Pressure relief measures. Protein supplements added. Monitor for recurrent drainage.             --foam dressing to area on left thigh.   -dc vac today 7/9  -local care to other wounds 7. Fluids/Electrolytes/Nutrition: Monitor I/O.   8.  MRSA bacteremia: Last CK 522-->continue Daptomycin  with EOT  11/17/23             --weekly CRP, Sed rate, CPK in addition to CBC/BMP.             --follow up with Dr. Dea 07/17 @ 9:30 am prior to EOT. Zinc , Vitamin C , Vitamin D  supplements started  -afebrile, clinically stable 9. T2DM on chronic insulin : Hgb A1C- 5.5 and well controlled (down from 6.4 earlier this year). Monitor BS ac/hs and use SSI for elevated BS.              -continue insulin  glargine 10 units and SSI was added today  D/c HS SSI, improved control over all. ---no changes 7/8  CBG (last 3)  Recent Labs    11/05/23 1625 11/05/23 2134 11/06/23 0620  GLUCAP 111* 182* 116*    10. Severe PAD s/p stents: On Plavix  and atorvastatin .  Monitor chronically draining area rea on right transmet site.   11. 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   -7/7 hgb down to 7.0---asymptomatic  -stools recently heme negative  -7/8 hgb 7.1, clinically stable. No changes to plan  -repeat labs ordered for Thursday   12.  CAD/HFpEF w/EF  - EF 60-65%:  Elevated troponin felt to be due to demand ischemia --Down to 501 on 06/23.   Lasix  40mg  BID restarted Saturday for swelling pulmonary edema  RLE edema-RLE TED stocking f   -lasix  as above.   -gabapentin  reduced to 200mg  tid -7/9 episode as reported above. Feels better today after iv lasix   -weight still up compared to admit 7lbs  -give addnl 40mg  iv lasix  today  -continue same oral lasix   -K+ supp  -monitor renal fxn Filed Weights   11/04/23 0425 11/05/23 0811 11/06/23 0600  Weight: 98.9 kg 98.4 kg (P) 98.3 kg    13.   Mod to severe Aortic stenosis/Moderate MR: Strict I/O w/daily weights. Monitor for signs of overload. Lasix  40mg  BID restarted  14.  Severe malnutrition: Albumin  level <1.5 w/ calcium  7.6-->corrects to 9.5. RD consulted  -improvement here will also help swelling.  15.  Abnormal LFTs: resolved  18. AKI: continue to monitor creatinine given increase in lasix   -7/9 follow up tomorrow  LOS: 8 days A FACE TO FACE EVALUATION WAS PERFORMED  Arthea ONEIDA Gunther 11/06/2023, 8:34 AM

## 2023-11-06 NOTE — Plan of Care (Signed)
  Problem: Consults Goal: RH LIMB LOSS PATIENT EDUCATION Description: Description: See Patient Education module for eduction specifics. Outcome: Progressing   Problem: RH BOWEL ELIMINATION Goal: RH STG MANAGE BOWEL WITH ASSISTANCE Description: STG Manage Bowel with mod I Assistance. Outcome: Progressing Goal: RH STG MANAGE BOWEL W/MEDICATION W/ASSISTANCE Description: STG Manage Bowel with Medication with mod I Assistance. Outcome: Progressing   Problem: RH SAFETY Goal: RH STG ADHERE TO SAFETY PRECAUTIONS W/ASSISTANCE/DEVICE Description: STG Adhere to Safety Precautions With cues Assistance/Device. Outcome: Progressing   Problem: RH PAIN MANAGEMENT Goal: RH STG PAIN MANAGED AT OR BELOW PT'S PAIN GOAL Description: < 4 with prns Outcome: Progressing   Problem: RH KNOWLEDGE DEFICIT LIMB LOSS Goal: RH STG INCREASE KNOWLEDGE OF SELF CARE AFTER LIMB LOSS Description: Patient and mother will be able to manage care at discharge using educational resources for medications, skin care, and dietary modification independently Outcome: Progressing   Problem: RH BOWEL ELIMINATION Goal: RH STG MANAGE BOWEL W/MEDICATION W/ASSISTANCE Description: STG Manage Bowel with Medication with mod I Assistance. Outcome: Progressing   Problem: RH SAFETY Goal: RH STG ADHERE TO SAFETY PRECAUTIONS W/ASSISTANCE/DEVICE Description: STG Adhere to Safety Precautions With cues Assistance/Device. Outcome: Progressing   Problem: RH KNOWLEDGE DEFICIT LIMB LOSS Goal: RH STG INCREASE KNOWLEDGE OF SELF CARE AFTER LIMB LOSS Description: Patient and mother will be able to manage care at discharge using educational resources for medications, skin care, and dietary modification independently Outcome: Progressing

## 2023-11-06 NOTE — Progress Notes (Signed)
 Occupational Therapy Weekly Progress Note  Patient Details  Name: Kayla Schmitt MRN: [id number] Date of Birth: 1966-02-04  Beginning of progress report period: October 30, 2023 End of progress report period: November 06, 2023   Patient has met 3 of 3 short term goals. Pt is making steady progress towards LTGs. She is able to bathe with supervision, UB dress with set up A, LB dress with min A and completes toileting with supervision. In the past week, pt has been mostly limited by RLE weakness, limiting pt's functional progress with transfers and ADLs at the standing level. In addition, pt continues to demonstrate functional endurance, GM coordination and limb care knowledge deficits resulting in difficulty completing BADL tasks without increased physical assist. Pt will benefit from continued skilled OT services to focus on mentioned deficits. Family ed not initiated as of date and will be needed in prep for DC.   Patient continues to demonstrate the following deficits: muscle weakness, decreased cardiorespiratoy endurance, decreased coordination, and decreased standing balance and therefore will continue to benefit from skilled OT intervention to enhance overall performance with BADL, iADL, and Reduce care partner burden.  Patient progressing toward long term goals..  Continue plan of care.  OT Short Term Goals Week 1:  OT Short Term Goal 1 (Week 1): Pt will complete 2/3 toileting steps with CGA for balance OT Short Term Goal 1 - Progress (Week 1): Met OT Short Term Goal 2 (Week 1): Pt will complete sit > stand in prep for ADL with CGA OT Short Term Goal 2 - Progress (Week 1): Met OT Short Term Goal 3 (Week 1): Pt will complete toilet transfer with CGA OT Short Term Goal 3 - Progress (Week 1): Met Week 2:  OT Short Term Goal 1 (Week 2): STG = LTG due to ELOS   Reniah Cottingham E Dalayla Aldredge, MS, OTR/L  11/06/2023, 12:30 PM

## 2023-11-06 NOTE — Patient Care Conference (Signed)
 Inpatient RehabilitationTeam Conference and Plan of Care Update Date: 11/06/2023   Time: 11:41 AM    Patient Name: Kayla Schmitt      Medical Record Number: [id number]  Date of Birth: 01-21-1966 Sex: Female         Room/Bed: 4W12C/4W12C-02 Payor Info: Payor: [employer] / Plan: CIGNA MANAGED / Product Type: *No Product type* /    Admit Date/Time:  10/29/2023  2:07 PM  Primary Diagnosis:  S/P BKA (below knee amputation) unilateral, left [facility])  Hospital Problems: Principal Problem:   S/P BKA (below knee amputation) unilateral, left (HCC) Active Problems:   Unilateral complete BKA, left, sequela [facility])    Expected Discharge Date: Expected Discharge Date: 11/11/23  Team Members Present: Physician leading conference: Dr. Prentice Compton Social Worker Present: Rhoda Clement, LCSW Nurse Present: Barnie Ronde, RN PT Present: Sherlean Perks, PT OT Present: Lorrayne Fritter, OT PPS Coordinator present : Eleanor Colon, SLP     Current Status/Progress Goal Weekly Team Focus  Bowel/Bladder   Pt continent of bowel and bladder.   Remain continent during admission.   Toilet q 4 hours while awake and PRN.    Swallow/Nutrition/ Hydration               ADL's   Set up A UB, Min A LB, Supervision toileting   Mod I   Barriers- functional endurance, RLE weakness, dynamic standing balance, limb care education    Mobility               Communication                Safety/Cognition/ Behavioral Observations               Pain   No c/o pain reported at this time.   Pain score to remain 3 or less on pain scale.   Assess for pain q shift and PRN.    Skin   L BKA w/ wound vac, R TMA, abrasion posterior L thigh with foam.   Skin to remain free from breakdown and s/sx of infection.  Assess skin q shift and PRN.      Discharge Planning:  Home- mom to assist and has been here. Will need IV antibitoics at home for another week and follow up. Ordered RW has other equipment.    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 Patient limited by pain, and poor endurance. Concerned about shortness of breath with activity and limited by pain and weakness with anemia.    Patient on target to meet rehab goals: yes, Patient wound vac removed 11/06/23.  Needs set up for upper body care and supervision - min assist for lower body care and toileting.  Requires cues for positioning and is spastic in nature with right ankle instability.  Needs min assist for transfers but can propel a wheelchair with supervision assist.  Goals for discharge set for mod I overall.  *See Care Plan and progress notes for long and short-term goals.   Revisions to Treatment Plan:  N/a   Teaching Needs: Safety, medication, limb loss/residual limb care, skin care, transfers, etc.   Current Barriers to Discharge: Decreased caregiver support  Possible Resolutions to Barriers: Family education OP follow up services DME: TTB, RW      Medical Summary Current Status: left bka, complicated by GIB. other wounds on right foot. anemia due to surgery and GIB  Barriers to Discharge: Medical stability;Complicated Wound   Possible Resolutions to [employer] Focus:  VAC comes off tomorrow. local care to other wounds. Hgb seems to be stabililzing with no active sources of bleeding discovered on w/u. continue to monitor closely   Continued Need for Acute Rehabilitation Level of Care: The patient requires daily medical management by a physician with specialized training in physical medicine and rehabilitation for the following reasons: Direction of a multidisciplinary physical rehabilitation program to maximize functional independence : Yes Medical management of patient stability for increased activity during participation in an intensive rehabilitation regime.: Yes Analysis of laboratory values and/or radiology reports with any subsequent need for medication adjustment  and/or medical intervention. : Yes   I attest that I was present, lead the team conference, and concur with the assessment and plan of the team.   Fredericka Sober B 11/06/2023, 4:04 PM

## 2023-11-06 NOTE — Progress Notes (Signed)
 Occupational Therapy Session Note  Patient Details  Name: Kayla Schmitt MRN: [id number] Date of Birth: May 20, 1965  Today's Date: 11/06/2023 OT Individual Time: 9194-9154 & 1050-1130 & [id number] OT Individual Time Calculation (min): 40 min & 40 min & 27 min   Short Term Goals: Week 1:  OT Short Term Goal 1 (Week 1): Pt will complete 2/3 toileting steps with CGA for balance OT Short Term Goal 2 (Week 1): Pt will complete sit > stand in prep for ADL with CGA OT Short Term Goal 3 (Week 1): Pt will complete toilet transfer with CGA  Skilled Therapeutic Interventions/Progress Updates:  Session 1 Skilled OT intervention completed with focus on ADL retraining, functional transfers, DC planning. Pt received upright in bed, agreeable to session. No pain reported, however did report poor night of sleep with SOB and low O2 sat. Per MD orders, pt now on O2 and to keep > 90%. Pt received on 1.5 L via [place] without SOB and remained during session.  Transitioned > EOB with mod I. RLE knee high TED hose donned with min A, and shoe with set up A. Lateral scoot with supervision to [facility]. Required increased time for voiding due to urinary difficulty (baseline for her). Pt required unreasonable amount of time to void; discussed how sitting on toilet for prolonged sitting on commode can cause pelvic floor dysfunction and/or hemorrhoids.  [name] provided pictures of her bathroom for OT review. Discussed with pt about potential challenges of placing grab bar on R wall beside toilet as it is a free standing wall without studs. Discussed she could purchase an additional DABSC to place over top of toilet for transfer purpose. Also discussed shower set up and potential implication with placing TTB in shower and no room for legs and potentially sitting perpendicular on bench and using built in shelf for toiletries.  Pt remained seated on BSC, and with all needs in reach at end of session.  Session  2 Skilled OT intervention completed with focus on ADL retraining. Pt received seated in w/c, agreeable to session. No pain reported. On RA with O2 sat WNL.  Pt requested to complete ADLs. Set up A for UB bathing/dressing, grooming and oral care. Reported urge to void. Brought bari DABSC next to bed for stability. Pt with poor recall of proper positioning for w/c next to [facility] with lateral technique but too far anterior. Discussed safety implications with transferring or bumping over the w/c wheel with preference for pt to bring chair back further and her scoot forward in chair for buttocks to have clear path. With this technique pt able to laterally scoot with supervision without difficulty. Pt was continent of urinary void but required extended time to void.   [name] present with concerns about pt's medical status with fluid on lungs, and SOB. OT notified PA and MD about mother's request for follow up. Mother inquired if the PT sessions with exercises could be causing her SOB- reviewed with pt and mother about pt's weight gain per chart review and history of heart condition and more sedentary lifestyle at w/c level that could contribute but anticipate the exertion is just exacerbating her symptoms already present. Pt remained seated on BSC with NT present at direct care handoff.  Session 3 Skilled OT intervention completed with focus on BUE strengthening and w/c positioning education. Pt received seated in w/c, agreeable to session. No pain reported.  Pt observed to have base of L residual limb perpendicular into limb support  pad. OT re-educated on proper positioning including knee extension for contracture prevention and wound integrity. OT applied towel with coban for reinforcement of knee extension. Discussed that with her personal w/c at home without leg rests, to seek elevating leg rest from the same place she purchases TTB and can build it up similarly to OT technique.  Self-propelled in w/c  > gym with assist for IV pole management. Seated in w/c, pt completed the following BUE exercises to promote BUE strength/endurance needed for independence with functional transfers and BADLs: (With yellow theraband) 2x15 reps Horizontal abduction Self-anchored bicep flexion each arm Shoulder external rotation Shoulder diagonal pulls  Issued pt carb smart ice cream at recreational therapy ice cream social and transported pt dependently in w/c back to room. Pt remained seated in w/c, with mother present and with all needs in reach at end of session.    Therapy Documentation Precautions:  Precautions Precautions: Fall Precaution/Restrictions Comments: L BKA with wound vac Restrictions Weight Bearing Restrictions Per Provider Order: Yes LLE Weight Bearing Per Provider Order: Non weight bearing    Therapy/Group: Individual Therapy  Lorrayne FORBES Fritter, MS, OTR/L  11/06/2023, 2:48 PM

## 2023-11-07 ENCOUNTER — Inpatient Hospital Stay (HOSPITAL_COMMUNITY)

## 2023-11-07 DIAGNOSIS — I509 Heart failure, unspecified: Secondary | ICD-10-CM

## 2023-11-07 LAB — D-DIMER, QUANTITATIVE: D-Dimer, Quant: 3.28 ug{FEU}/mL — ABNORMAL HIGH (ref 0.00–0.50)

## 2023-11-07 LAB — CBC
HCT: 23.5 % — ABNORMAL LOW (ref 36.0–46.0)
Hemoglobin: 7 g/dL — ABNORMAL LOW (ref 12.0–15.0)
MCH: 28 pg (ref 26.0–34.0)
MCHC: 29.8 g/dL — ABNORMAL LOW (ref 30.0–36.0)
MCV: 94 fL (ref 80.0–100.0)
Platelets: 255 K/uL (ref 150–400)
RBC: 2.5 MIL/uL — ABNORMAL LOW (ref 3.87–5.11)
RDW: 18.1 % — ABNORMAL HIGH (ref 11.5–15.5)
WBC: 5 K/uL (ref 4.0–10.5)
nRBC: 0 % (ref 0.0–0.2)

## 2023-11-07 LAB — BASIC METABOLIC PANEL WITH GFR
Anion gap: 9 (ref 5–15)
BUN: 29 mg/dL — ABNORMAL HIGH (ref 6–20)
CO2: 25 mmol/L (ref 22–32)
Calcium: 8.3 mg/dL — ABNORMAL LOW (ref 8.9–10.3)
Chloride: 106 mmol/L (ref 98–111)
Creatinine, Ser: 1.17 mg/dL — ABNORMAL HIGH (ref 0.44–1.00)
GFR, Estimated: 55 mL/min — ABNORMAL LOW (ref >60)
Glucose, Bld: 136 mg/dL — ABNORMAL HIGH (ref 70–99)
Potassium: 3.9 mmol/L (ref 3.5–5.1)
Sodium: 140 mmol/L (ref 135–145)

## 2023-11-07 LAB — GLUCOSE, CAPILLARY
Glucose-Capillary: 125 mg/dL — ABNORMAL HIGH (ref 70–99)
Glucose-Capillary: 112 mg/dL — ABNORMAL HIGH (ref 70–99)
Glucose-Capillary: 139 mg/dL — ABNORMAL HIGH (ref 70–99)
Glucose-Capillary: 139 mg/dL — ABNORMAL HIGH (ref 70–99)

## 2023-11-07 LAB — ALBUMIN: Albumin: 2 g/dL — ABNORMAL LOW (ref 3.5–5.0)

## 2023-11-07 LAB — BRAIN NATRIURETIC PEPTIDE: B Natriuretic Peptide: 336.4 pg/mL — ABNORMAL HIGH (ref 0.0–100.0)

## 2023-11-07 LAB — TROPONIN I (HIGH SENSITIVITY): Troponin I (High Sensitivity): 48 ng/L — ABNORMAL HIGH (ref <18)

## 2023-11-07 MED ORDER — FUROSEMIDE 10 MG/ML IJ SOLN
40.0000 mg | Freq: Two times a day (BID) | INTRAMUSCULAR | Status: DC
  Administered 2023-11-08 – 2023-11-10 (×5): 40 mg via INTRAVENOUS
  Filled 2023-11-07 (×5): qty 4

## 2023-11-07 MED ORDER — FUROSEMIDE 10 MG/ML IJ SOLN
40.0000 mg | Freq: Once | INTRAMUSCULAR | Status: AC
  Administered 2023-11-07: 40 mg via INTRAVENOUS
  Filled 2023-11-07: qty 4

## 2023-11-07 MED ORDER — DAPTOMYCIN IV (FOR PTA / DISCHARGE USE ONLY): 700.0000 mg | INTRAVENOUS | 0 refills | Status: DC

## 2023-11-07 MED ORDER — FUROSEMIDE 40 MG PO TABS
40.0000 mg | ORAL_TABLET | Freq: Three times a day (TID) | ORAL | Status: DC
  Administered 2023-11-07: 40 mg via ORAL
  Filled 2023-11-07: qty 1

## 2023-11-07 NOTE — Progress Notes (Addendum)
 Discussed results of CXR with patient as well as Dr. Babs. She does not have fever or chills, cough or other signs of infection. She would like transfusion as she feels weak but discussed that we need to diurese her first and will re-evaluate in am. She did get dose of IV lasix  this afternoon and will add another dose this evening. Trops and BNP down. D-dimer elevated. Will order RLE dopplers.  She is feeling better and will monitor for now.

## 2023-11-07 NOTE — Progress Notes (Signed)
 Physical Therapy Weekly Progress Note  Patient Details  Name: Kayla Schmitt MRN: [id number] Date of Birth: 1966-03-30  Beginning of progress report period: October 30, 2023 End of progress report period: November 07, 2023  Patient has met 2 of 3 short term goals. Pt demonstrates steady progress towards long term goals. Pt is currently able to perform bed mobility with supervision, lateral scoot transfers with supervision, sit<>stands with RW and CGA, and perform WC mobility 118ft with supervision/mod I. Pt continues to be limited by intermittent pain, weakness/deconditioning, but mostly decreased standing balance due to tendency for R ankle to roll out of shoe - therefore have deferred working on ambulation. Have tried regular tennis shoe and high top shoe and plan to trial ASO. Pt will continue to benefit from skilled PT services to address current deficits.   Patient continues to demonstrate the following deficits muscle weakness, decreased cardiorespiratoy endurance, and decreased standing balance, decreased postural control, decreased balance strategies, and difficulty maintaining precautions and therefore will continue to benefit from skilled PT intervention to increase functional independence with mobility.  Patient progressing toward long term goals..  Continue plan of care.  PT Short Term Goals Week 1:  PT Short Term Goal 1 (Week 1): pt will transfer bed<>chair with LRAD and CGA PT Short Term Goal 1 - Progress (Week 1): Met PT Short Term Goal 2 (Week 1): pt will transfer sit<>stand with LRAD and CGA PT Short Term Goal 2 - Progress (Week 1): Progressing toward goal PT Short Term Goal 3 (Week 1): pt will perform simulated car transfer with LRAD and CGA PT Short Term Goal 3 - Progress (Week 1): Met Week 2:  PT Short Term Goal 1 (Week 2): STG=LTG due to LOS  Skilled Therapeutic Interventions/Progress Updates:  Ambulation/gait training;Discharge planning;Functional mobility  training;Psychosocial support;Therapeutic Activities;Balance/vestibular training;Disease management/prevention;Neuromuscular re-education;Skin care/wound management;Therapeutic Exercise;Wheelchair propulsion/positioning;DME/adaptive equipment instruction;Pain management;Splinting/orthotics;UE/LE Strength taining/ROM;Community reintegration;Functional electrical stimulation;Patient/family education;Stair training;UE/LE Coordination activities   Therapy Documentation Precautions:  Precautions Precautions: Fall Precaution/Restrictions Comments: L BKA with wound vac Restrictions Weight Bearing Restrictions Per Provider Order: Yes LLE Weight Bearing Per Provider Order: Non weight bearing   Therapy/Group: Individual Therapy Therisa HERO Zaunegger Therisa Stains PT, DPT 11/07/2023, 7:07 AM

## 2023-11-07 NOTE — Consult Note (Signed)
 Cardiology Consultation   Patient ID: Kayla Schmitt MRN: [id number]; DOB: October 16, 1965  Admit date: 10/29/2023 Date of Consult: 11/07/2023  PCP:  Kayla Jacquline NOVAK, NP   [facility] Providers Cardiologist:  Diannah SHAUNNA Maywood, MD      Patient Profile: Kayla Schmitt is a 58 y.o. female with a hx of PAD s/p stents and  bilateral metatarsal amputation, HFmrEF, CAD with prior NSTEMI on 07/2022, type 2 diabetes, hypertension, and hyperlipidemia who is being seen 11/07/2023 for the evaluation of CHF at the request of Ivy Railing MD.  History of Present Illness: Ms. Cassar is a 58 year old female with prior cardiac history listed below  She had an NSTEMI on 07/2022 a cath was done and revealed severe three-vessel disease.  Was recommended for CABG and MVR.  Has not had this surgery done yet because of multiple ongoing medical concerns.  Was hospitalized on 09/2023 for MRSA bacteremia and necrotic fasciitis.  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  The patient also ended up getting a TEE on 10/24/2023 that showed LVEF of 60 to 65%, mild to moderate MR, moderate to severe aortic stenosis, but found no evidence of endocarditis.  Because of the amputation was transferred to inpatient rehab.  Was also placed on 4 weeks of IV antibiotics because of bacteremia.  While in a patient rehab patient had worsening right lower extremity edema.  A chest x-ray was done and showed vascular congestion and diffuse bilateral airspace disease.  Because of the dose of Lasix  40 mg twice daily was started.  Lower extremity Doppler on 11/03/2023 was negative. She also has a GI bleed due to AVMs.  Hemoglobin is down to 7.0, hypoalbuminemia albumin  was 2.0.  Elevated D-dimer of 3.28.  On interview patient reported swelling on her right lower extremity and dyspnea on exertion that started on Friday.  She feels like the lower extremity edema has improved slightly since starting her oral  Lasix .  Also reported having worsening shortness of breath and dyspnea on exertion.  Has also had some intermittent chest tightness.  Stated that she felt nauseous this morning but denies any vomiting.  Has chronic orthopnea but denies PND, and diaphoresis.   Past Medical History:  Diagnosis Date   Anemia    Aortic stenosis    Arthritis    CHF (congestive heart failure) (HCC)    Coronary artery disease    Diabetes mellitus without complication (HCC)    type 2   GERD (gastroesophageal reflux disease)    Heart murmur    History of blood transfusion    Hypercholesteremia    Hypertension    Hypothyroidism    Mitral regurgitation    Myocardial infarction (HCC)    Peripheral vascular disease (HCC)    PONV (postoperative nausea and vomiting)    Thyroid disease     Past Surgical History:  Procedure Laterality Date   ABDOMINAL AORTOGRAM W/LOWER EXTREMITY Bilateral 01/14/2020   Procedure: ABDOMINAL AORTOGRAM W/LOWER EXTREMITY;  Surgeon: Gretta Lonni PARAS, MD;  Location: [facility];  Service: Cardiovascular;  Laterality: Bilateral;   ABDOMINAL AORTOGRAM W/LOWER EXTREMITY N/A 07/05/2022   Procedure: ABDOMINAL AORTOGRAM W/LOWER EXTREMITY;  Surgeon: Gretta Lonni PARAS, MD;  Location: [facility];  Service: Cardiovascular;  Laterality: N/A;   ABDOMINAL AORTOGRAM W/LOWER EXTREMITY N/A 08/02/2022   Procedure: ABDOMINAL AORTOGRAM W/LOWER EXTREMITY;  Surgeon: Gretta Lonni PARAS, MD;  Location: [facility];  Service: Cardiovascular;  Laterality: N/A;   ABDOMINAL AORTOGRAM  W/LOWER EXTREMITY N/A 08/06/2022   Procedure: ABDOMINAL AORTOGRAM W/LOWER EXTREMITY;  Surgeon: Gretta Lonni PARAS, MD;  Location: [facility] INVASIVE CV LAB;  Service: Cardiovascular;  Laterality: N/A;   AMPUTATION Bilateral 07/20/2022   Procedure: AMPUTATION RIGHT GREAT TOE AND SECOND TOE, AMPUTATION LEFT GREAT TOE;  Surgeon: Harden Jerona GAILS, MD;  Location: [facility] OR;  Service: Orthopedics;  Laterality: Bilateral;    AMPUTATION Bilateral 08/08/2022   Procedure: BILATERAL TRANSMETATARSAL AMPUTATION;  Surgeon: Harden Jerona GAILS, MD;  Location: [facility] OR;  Service: Orthopedics;  Laterality: Bilateral;   AMPUTATION Left 10/22/2023   Procedure: AMPUTATION BELOW KNEE;  Surgeon: Lanis Fonda BRAVO, MD;  Location: [facility] OR;  Service: Vascular;  Laterality: Left;   AMPUTATION TOE     BIOPSY  04/06/2023   Procedure: BIOPSY;  Surgeon: Paiton Elspeth SQUIBB, MD;  Location: [facility] ENDOSCOPY;  Service: Gastroenterology;;   COLONOSCOPY WITH PROPOFOL  N/A 04/06/2023   Procedure: COLONOSCOPY WITH PROPOFOL ;  Surgeon: Nivea Elspeth SQUIBB, MD;  Location: [facility] ENDOSCOPY;  Service: Gastroenterology;  Laterality: N/A;   ESOPHAGOGASTRODUODENOSCOPY (EGD) WITH PROPOFOL  N/A 04/06/2023   Procedure: ESOPHAGOGASTRODUODENOSCOPY (EGD) WITH PROPOFOL ;  Surgeon: Koryn Elspeth SQUIBB, MD;  Location: [facility] ENDOSCOPY;  Service: Gastroenterology;  Laterality: N/A;   GIVENS CAPSULE STUDY N/A 04/06/2023   Procedure: GIVENS CAPSULE STUDY;  Surgeon: Dilyn Elspeth SQUIBB, MD;  Location: [facility] ENDOSCOPY;  Service: Gastroenterology;  Laterality: N/A;   PERIPHERAL VASCULAR ATHERECTOMY Right 01/14/2020   Procedure: PERIPHERAL VASCULAR ATHERECTOMY;  Surgeon: Gretta Lonni PARAS, MD;  Location: [facility] INVASIVE CV LAB;  Service: Cardiovascular;  Laterality: Right;  sfa   PERIPHERAL VASCULAR INTERVENTION Right 01/14/2020   Procedure: PERIPHERAL VASCULAR INTERVENTION;  Surgeon: Gretta Lonni PARAS, MD;  Location: [facility];  Service: Cardiovascular;  Laterality: Right;  sfa stent    PERIPHERAL VASCULAR INTERVENTION  07/05/2022   Procedure: PERIPHERAL VASCULAR INTERVENTION;  Surgeon: Gretta Lonni PARAS, MD;  Location: [facility];  Service: Cardiovascular;;   PERIPHERAL VASCULAR INTERVENTION  08/02/2022   Procedure: PERIPHERAL VASCULAR INTERVENTION;  Surgeon: Gretta Lonni PARAS, MD;  Location: [facility];  Service: Cardiovascular;;   PERIPHERAL VASCULAR INTERVENTION   08/06/2022   Procedure: PERIPHERAL VASCULAR INTERVENTION;  Surgeon: Gretta Lonni PARAS, MD;  Location: [facility] INVASIVE CV LAB;  Service: Cardiovascular;;   PERIPHERAL VASCULAR THROMBECTOMY  08/02/2022   Procedure: PERIPHERAL VASCULAR THROMBECTOMY;  Surgeon: Gretta Lonni PARAS, MD;  Location: [facility];  Service: Cardiovascular;;   REVISION AMPUTATION, BELOW THE KNEE Left 10/23/2023   Procedure: REVISION AMPUTATION, BELOW THE KNEE;  Surgeon: Harden Jerona GAILS, MD;  Location: [facility] OR;  Service: Orthopedics;  Laterality: Left;  REVISION LEFT BELOW KNEE AMPUTATION   RIGHT/LEFT HEART CATH AND CORONARY ANGIOGRAPHY N/A 08/23/2022   Procedure: RIGHT/LEFT HEART CATH AND CORONARY ANGIOGRAPHY;  Surgeon: Dann Candyce RAMAN, MD;  Location: [facility] INVASIVE CV LAB;  Service: Cardiovascular;  Laterality: N/A;   SKIN SPLIT GRAFT Right 03/18/2020   Procedure: SKIN GRAFTING RIGHT FOOT ULCER;  Surgeon: Harden Jerona GAILS, MD;  Location: [facility] OR;  Service: Orthopedics;  Laterality: Right;   STUMP REVISION Right 11/30/2022   Procedure: REVISION RIGHT TRANSMETATARSAL AMPUTATION;  Surgeon: Harden Jerona GAILS, MD;  Location: [facility] OR;  Service: Orthopedics;  Laterality: Right;   TEE WITHOUT CARDIOVERSION N/A 08/27/2022   Procedure: TRANSESOPHAGEAL ECHOCARDIOGRAM;  Surgeon: Hobart Powell BRAVO, MD;  Location: [facility] INVASIVE CV LAB;  Service: Cardiovascular;  Laterality: N/A;   TRANSESOPHAGEAL ECHOCARDIOGRAM (CATH LAB) N/A 10/24/2023   Procedure: TRANSESOPHAGEAL ECHOCARDIOGRAM;  Surgeon: Michele Richardson,  DO;  Location: [facility];  Service: Cardiovascular;  Laterality: N/A;   WISDOM TOOTH EXTRACTION       Home Medications:  Prior to Admission medications   Medication Sig Start Date End Date Taking? Authorizing Provider  acetaminophen  (TYLENOL ) 500 MG tablet Take 1,000 mg by mouth 2 (two) times daily as needed for moderate pain (pain score 4-6), headache or fever.    [provider]  Alcohol Swabs  (PHARMACIST CHOICE ALCOHOL) PADS  Apply topically. 04/09/23   [provider]  aspirin  81 MG EC tablet Take 81 mg by mouth daily.    [provider]  atorvastatin  (LIPITOR) 40 MG tablet Take 40 mg by mouth daily. 12/14/19   [provider]  bisacodyl  (DULCOLAX) 10 MG suppository Place 1 suppository (10 mg total) rectally daily as needed for moderate constipation or mild constipation. 10/29/23   Hongalgi, Anand D, MD  clopidogrel  (PLAVIX ) 75 MG tablet TAKE 1 TABLET BY MOUTH EVERY DAY WITH BREAKFAST 01/28/23   Sheree Penne Bruckner, MD  daptomycin  (CUBICIN ) IVPB Inject 700 mg into the vein daily for 6 days. Indication:  MRSA bacteremia First Dose: Yes Last Day of Therapy:  11/17/23 Labs - Once weekly:  CBC/D, BMP, and CPK Labs - Once weekly: ESR and CRP Method of administration: IV Push Method of administration may be changed at the discretion of home infusion pharmacist based upon assessment of the patient and/or caregiver's ability to self-administer the medication ordered. Followed by ID physician: Dr. Annalee Orem 11/11/23 11/17/23  Love, Sharlet RAMAN, PA-C  diphenhydrAMINE  HCl, Sleep, (ZZZQUIL) 25 MG CAPS Take 25 mg by mouth at bedtime as needed (Sleep).    [provider]  ferrous sulfate  325 (65 FE) MG EC tablet Take 1 tablet (325 mg total) by mouth daily with breakfast. 04/07/23 04/06/24  Fairy Frames, MD  furosemide  (LASIX ) 40 MG tablet Take 1 tablet (40 mg total) by mouth 2 (two) times daily. 08/27/22   Leotis Bogus, MD  gabapentin  (NEURONTIN ) 300 MG capsule Take 300 mg by mouth at bedtime.    [provider]  insulin  degludec (TRESIBA  FLEXTOUCH) 100 UNIT/ML FlexTouch Pen Inject 10 Units into the skin at bedtime. Patient taking differently: Inject 16 Units into the skin at bedtime. 01/15/20   Danford, Bruckner SQUIBB, MD  levothyroxine  (SYNTHROID ) 150 MCG tablet Take 150 mcg by mouth daily before breakfast. 12/02/19   [provider]  metoprolol  succinate (TOPROL -XL) 25 MG  24 hr tablet Take 0.5 tablets (12.5 mg total) by mouth daily. 08/28/22   Leotis Bogus, MD  nitroGLYCERIN  (NITROSTAT ) 0.4 MG SL tablet Place 1 tablet (0.4 mg total) under the tongue every 5 (five) minutes x 3 doses as needed for chest pain (if no relief after 3rd dose proceed to ED or call 911). 11/06/2022-New 11/06/22   Mallipeddi, Vishnu P, MD  oxyCODONE  (ROXICODONE ) 5 MG immediate release tablet Take 1 tablet (5 mg total) by mouth every 6 (six) hours as needed for severe pain (pain score 7-10). 10/15/23   Francesca Elsie CROME, MD  pantoprazole  (PROTONIX ) 40 MG tablet Take 40 mg by mouth daily. 12/21/19   [provider]  polyethylene glycol (MIRALAX  / GLYCOLAX ) 17 g packet Take 17 g by mouth 2 (two) times daily. 10/29/23   Hongalgi, Anand D, MD  promethazine  (PHENERGAN ) 25 MG tablet Take 1 tablet (25 mg total) by mouth every 6 (six) hours as needed for nausea or vomiting. 10/15/23   Francesca Elsie CROME, MD  senna-docusate (SENOKOT-S) 8.6-50  MG tablet Take 1 tablet by mouth 2 (two) times daily. 10/29/23   Hongalgi, Anand D, MD    Scheduled Meds:  sodium chloride    Intravenous Once   ascorbic acid   250 mg Oral Daily   atorvastatin   40 mg Oral Q supper   Chlorhexidine  Gluconate Cloth  6 each Topical Q12H   cholecalciferol   1,000 Units Oral Daily   clopidogrel   75 mg Oral Daily   enoxaparin  (LOVENOX ) injection  40 mg Subcutaneous Q24H   ferrous sulfate   325 mg Oral Q breakfast   furosemide   40 mg Oral TID   gabapentin   200 mg Oral TID   insulin  aspart  0-9 Units Subcutaneous TID WC   insulin  glargine-yfgn  10 Units Subcutaneous QHS   levothyroxine   150 mcg Oral QAC breakfast   mupirocin  cream   Topical Daily   nutrition supplement (JUVEN)  1 packet Oral BID BM   nystatin    Topical BID   pantoprazole   40 mg Oral BID   polyethylene glycol  17 g Oral BID   potassium chloride   20 mEq Oral BID   Ensure Max Protein  11 oz Oral QHS   senna-docusate  2 tablet Oral Q breakfast   zinc  sulfate  (50mg  elemental zinc )  220 mg Oral Daily   Continuous Infusions:  DAPTOmycin  700 mg (11/07/23 1455)   PRN Meds: acetaminophen , alum & mag hydroxide-simeth, bisacodyl , diphenhydrAMINE , guaiFENesin -dextromethorphan, melatonin, methocarbamol , oxyCODONE -acetaminophen , phenol, prochlorperazine  **OR** prochlorperazine  **OR** prochlorperazine , sodium chloride  flush, sodium phosphate , trimethobenzamide   Allergies:    Allergies  Allergen Reactions   Trental  [Pentoxifylline ] Nausea And Vomiting   Vibramycin [Doxycycline] Nausea And Vomiting   Penicillins Rash    Tolerated cephalosporins 07/2022     Social History:   Social History   Socioeconomic History   Marital status: Divorced    Spouse name: Not on file   Number of children: 0   Years of education: Not on file   Highest education level: Not on file  Occupational History   Not on file  Tobacco Use   Smoking status: Former    Current packs/day: 0.00    Types: Cigarettes    Quit date: 01/2017    Years since quitting: 6.7   Smokeless tobacco: Never  Vaping Use   Vaping status: Never Used  Substance and Sexual Activity   Alcohol use: Not Currently   Drug use: Yes    Types: Marijuana    Comment: on ocassion- Last time - 11/13/22   Sexual activity: Yes    Birth control/protection: Post-menopausal  Other Topics Concern   Not on file  Social History Narrative   Not on file   Social Drivers of Health   Financial Resource Strain: Low Risk  (10/19/2023)   Received from [facility]   Overall Financial Resource Strain (CARDIA)    How hard is it for you to pay for the very basics like food, housing, medical care, and heating?: Not hard at all  Food Insecurity: No Food Insecurity (10/20/2023)   Hunger Vital Sign    Worried About Running Out of Food in the Last Year: Never true    Ran Out of Food in the Last Year: Never true  Transportation Needs: No Transportation Needs (10/20/2023)   PRAPARE - Transportation    Lack of  Transportation (Medical): No    Lack of Transportation (Non-Medical): No  Physical Activity: Not on file  Stress: Not on file  Social Connections: Not on file  Intimate Partner Violence:  Not At Risk (10/20/2023)   Humiliation, Afraid, Rape, and Kick questionnaire    Fear of Current or Ex-Partner: No    Emotionally Abused: No    Physically Abused: No    Sexually Abused: No    Family History:    Family History  Problem Relation Age of Onset   Diabetes Other    CAD Other      ROS:  Please see the history of present illness.   All other ROS reviewed and negative.     Physical Exam/Data: Vitals:   11/06/23 1359 11/06/23 1800 11/06/23 2006 11/07/23 0624  BP: (!) 98/55 (!) 121/56 126/63 109/61  Pulse: 87 86 88 94  Resp: 16   18  Temp: 98.7 F (37.1 C)  98.2 F (36.8 C) 98 F (36.7 C)  TempSrc:   Oral Oral  SpO2: 98%  100% 94%  Weight:    95.2 kg  Height:        Intake/Output Summary (Last 24 hours) at 11/07/2023 1518 Last data filed at 11/07/2023 1303 Gross per 24 hour  Intake 240 ml  Output --  Net 240 ml      11/07/2023    6:24 AM 11/06/2023    6:00 AM 11/05/2023    8:11 AM  Last 3 Weights  Weight (lbs) 209 lb 14.1 oz 216 lb 11.4 oz 217 lb  Weight (kg) 95.2 kg 98.3 kg 98.431 kg     Body mass index is 36.03 kg/m.  General:  Well nourished, well developed, in no acute distress HEENT: normal Neck: JVD assessment limited by body habitus Vascular: No carotid bruits; Distal pulses 2+ bilaterally Cardiac: She had a 3/6 systolic murmur heard best on upper sternal borders that radiated to the carotids Lungs: Bibasilar crackles. Abd: soft, nontender, no hepatomegaly  Ext: 2+ pitting edema on right lower extremity.  Left lower extremity amputated below the knee. Musculoskeletal:  No deformities. Skin: warm and dry  Neuro:  no focal abnormalities noted Psych:  Normal affect   EKG:  The EKG was personally reviewed and demonstrates: EKG pending Telemetry: No telemetry is  available on rehab floor  Relevant CV Studies: TEE on 10/24/23 IMPRESSIONS     1. Left ventricular ejection fraction, by estimation, is 60 to 65%. The  left ventricle has normal function. The left ventricle has no regional  wall motion abnormalities.   2. Right ventricular systolic function is normal. The right ventricular  size is normal.   3. No left atrial/left atrial appendage thrombus was detected. The LAA  emptying velocity was 66 cm/s.   4. The mitral valve is degenerative. Mild to moderate mitral valve  regurgitation. No evidence of mitral stenosis.   5. Native valve, calcified, reduced leaflet excursion, mild to moderate  aortic regurgitation, moderate to severe aortic stenosis (peak velocity  3.4 m/s, mean gradient 27 mmHg, aortic valve area per VTI 1.07 cm,  dimensional index 0.26).   6. There is mild (Grade II) layered plaque involving the descending  aorta.   7. Agitated saline contrast bubble study was negative, with no evidence  of any interatrial shunt.   Conclusion(s)/Recommendation(s): No evidence of vegetation/infective  endocarditis on this transesophageael echocardiogram.    Laboratory Data: High Sensitivity Troponin:   Recent Labs  Lab 10/20/23 2200 10/21/23 0730  TROPONINIHS 532* 501*     Chemistry Recent Labs  Lab 11/04/23 0500 11/06/23 0246 11/07/23 0434  NA 138 136 140  K 3.7 3.8 3.9  CL 104 103 106  CO2 24 26 25   GLUCOSE 100* 150* 136*  BUN 26* 28* 29*  CREATININE 1.01* 1.14* 1.17*  CALCIUM  8.2* 8.0* 8.3*  GFRNONAA >60 56* 55*  ANIONGAP 10 7 9     Recent Labs  Lab 11/07/23 0434  ALBUMIN  2.0*   Lipids No results for input(s): CHOL, TRIG, HDL, LABVLDL, LDLCALC, CHOLHDL in the last 168 hours.  Hematology Recent Labs  Lab 11/03/23 0112 11/04/23 0500 11/05/23 0330 11/07/23 0434  WBC 6.4 4.4  --  5.0  RBC 2.59* 2.54*  --  2.50*  HGB 7.5* 7.0* 7.1* 7.0*  HCT 23.8* 23.8* 23.3* 23.5*  MCV 91.9 93.7  --  94.0  MCH  29.0 27.6  --  28.0  MCHC 31.5 29.4*  --  29.8*  RDW 17.5* 17.8*  --  18.1*  PLT 229 217  --  255   Thyroid No results for input(s): TSH, FREET4 in the last 168 hours.  BNP Recent Labs  Lab 11/03/23 0112  BNP 793.8*    DDimer No results for input(s): DDIMER in the last 168 hours.  Radiology/Studies:  DG Chest 2 View Result Date: 11/05/2023 CLINICAL DATA:  Shortness of breath EXAM: CHEST - 2 VIEW COMPARISON:  11/02/2023 FINDINGS: Left PICC line is in place. The catheter now courses into the right innominate vein with the tip likely in the right internal jugular vein. Cardiomegaly, vascular congestion. Diffuse bilateral airspace disease, worsening since prior study. No effusions or pneumothorax. No acute bony abnormality. IMPRESSION: Left PICC line now courses into the right innominate vein with the tip in the right internal jugular vein. Worsening diffuse bilateral airspace disease, likely edema although infection not excluded. Electronically Signed   By: Franky Crease M.D.   On: 11/05/2023 20:30     Assessment and Plan:  Kielee Care is a 58 y.o. female with a hx of PAD s/p metatarsal amputation, HFmrEF, CAD with prior NSTEMI on 07/2022, type 2 diabetes, hypertension, and hyperlipidemia who is being seen 11/07/2023 for the evaluation of CHF at the request of Ivy Railing MD.  Acute on chronic heart failure with preserved ejection fraction Moderate to severe AS TEE on 10/24/2023 that showed LVEF of 60 to 65%, mild to moderate MR, moderate to severe aortic stenosis. Had worsening lower extremity edema and dyspnea on exertion that started on Friday.  Was started on oral Lasix  but continues to have lower extremity edema and dyspnea on exertion.  Elevated BNP of 336.4.   Chest x-ray found diffuse interstitial alveolar opacities throughout the lungs that are slightly improved from prior study.  Has hypoalbuminemia that is likely contributing to third spacing. recommend considering  pulmonary CTA to rule out PE.  Strict I's and O's Stop oral Lasix  Start 40 mg IV Lasix  twice daily GDMT: Will consider starting Jardiance  and spironolactone  Anemia  Has a known history of GI bleed secondary to AVMs. Hemoglobin continues to remain at 7.  Anemia is likely contributing to shortness of breath.  Elevated high-sensitivity troponin 48 Severe three-vessel CAD Hyperlipidemia These are down significantly from initial hospitalization on 10/20/2023 and 10/21/2023.  This is likely secondary to demand ischemia from acute on chronic heart failure. Previously recommended for CABG and MVR but has not had this done yet because of multiple ongoing medical concerns.  Continue atorvastatin  40 mg daily  PAD Continue Plavix  75 mg daily.  Otherwise managed for primary    Risk Assessment/Risk Scores:       [place]  Heart Association (NYHA) Functional Class  NYHA Class II       For questions or updates, please contact [facility] Please consult [email or web address] for contact info under    Signed, Morse Clause, PA-C  11/07/2023 3:18 PM

## 2023-11-07 NOTE — Progress Notes (Signed)
 Wound vac removed. Minimal bloody drainage from left side of incision, sutures in place other wise healing nicely. Dressing applied followed by shrinker. Patient tolerated well.  Geni Armor, LPN

## 2023-11-07 NOTE — Evaluation (Signed)
 Recreational Therapy Assessment and Plan  Patient Details  Name: Kayla Schmitt MRN: [id number] Date of Birth: [birth year] Today's Date: 11/07/2023  Rehab Potential:  Good ELOS:   d/c 7/14  Assessment  Hospital Problem: Principal Problem:   S/P BKA (below knee amputation) unilateral, left (HCC) Active Problems:   Unilateral complete BKA, left, sequela (HCC)     Past Medical History:      Past Medical History:  Diagnosis Date   Anemia     Aortic stenosis     Arthritis     CHF (congestive heart failure) (HCC)     Coronary artery disease     Diabetes mellitus without complication (HCC)      type 2   GERD (gastroesophageal reflux disease)     Heart murmur     History of blood transfusion     Hypercholesteremia     Hypertension     Hypothyroidism     Mitral regurgitation     Myocardial infarction (HCC)     Peripheral vascular disease (HCC)     PONV (postoperative nausea and vomiting)     Thyroid disease          Past Surgical History:       Past Surgical History:  Procedure Laterality Date   ABDOMINAL AORTOGRAM W/LOWER EXTREMITY Bilateral 01/14/2020    Procedure: ABDOMINAL AORTOGRAM W/LOWER EXTREMITY;  Surgeon: Gretta Lonni PARAS, MD;  Location: [facility];  Service: Cardiovascular;  Laterality: Bilateral;   ABDOMINAL AORTOGRAM W/LOWER EXTREMITY N/A 07/05/2022    Procedure: ABDOMINAL AORTOGRAM W/LOWER EXTREMITY;  Surgeon: Gretta Lonni PARAS, MD;  Location: [facility];  Service: Cardiovascular;  Laterality: N/A;   ABDOMINAL AORTOGRAM W/LOWER EXTREMITY N/A 08/02/2022    Procedure: ABDOMINAL AORTOGRAM W/LOWER EXTREMITY;  Surgeon: Gretta Lonni PARAS, MD;  Location: [facility];  Service: Cardiovascular;  Laterality: N/A;   ABDOMINAL AORTOGRAM W/LOWER EXTREMITY N/A 08/06/2022    Procedure: ABDOMINAL AORTOGRAM W/LOWER EXTREMITY;  Surgeon: Gretta Lonni PARAS, MD;  [facility]: [facility];  Service: Cardiovascular;  Laterality: N/A;    AMPUTATION Bilateral 07/20/2022    Procedure: AMPUTATION RIGHT GREAT TOE AND SECOND TOE, AMPUTATION LEFT GREAT TOE;  Surgeon: Harden Jerona GAILS, MD;  Location: [facility];  Service: Orthopedics;  Laterality: Bilateral;   AMPUTATION Bilateral 08/08/2022    Procedure: BILATERAL TRANSMETATARSAL AMPUTATION;  Surgeon: Harden Jerona GAILS, MD;  Location: [facility] OR;  Service: Orthopedics;  Laterality: Bilateral;   AMPUTATION Left 10/22/2023    Procedure: AMPUTATION BELOW KNEE;  Surgeon: Lanis Fonda BRAVO, MD;  Location: [facility] OR;  Service: Vascular;  Laterality: Left;   AMPUTATION TOE       BIOPSY   04/06/2023    Procedure: BIOPSY;  Surgeon: Romanda Elspeth SQUIBB, MD;  Location: [facility] ENDOSCOPY;  Service: Gastroenterology;;   COLONOSCOPY WITH PROPOFOL  N/A 04/06/2023    Procedure: COLONOSCOPY WITH PROPOFOL ;  Surgeon: Shanquita Elspeth SQUIBB, MD;  Location: [facility] ENDOSCOPY;  Service: Gastroenterology;  Laterality: N/A;   ESOPHAGOGASTRODUODENOSCOPY (EGD) WITH PROPOFOL  N/A 04/06/2023    Procedure: ESOPHAGOGASTRODUODENOSCOPY (EGD) WITH PROPOFOL ;  Surgeon: Tanita Elspeth SQUIBB, MD;  Location: [facility] ENDOSCOPY;  Service: Gastroenterology;  Laterality: N/A;   GIVENS CAPSULE STUDY N/A 04/06/2023    Procedure: GIVENS CAPSULE STUDY;  Surgeon: Aberdeen Elspeth SQUIBB, MD;  Location: [facility] ENDOSCOPY;  Service: Gastroenterology;  Laterality: N/A;   PERIPHERAL VASCULAR ATHERECTOMY Right 01/14/2020    Procedure: PERIPHERAL VASCULAR ATHERECTOMY;  Surgeon: Gretta Lonni PARAS, MD;  Location: [facility] INVASIVE CV LAB;  Service:  Cardiovascular;  Laterality: Right;  sfa   PERIPHERAL VASCULAR INTERVENTION Right 01/14/2020    Procedure: PERIPHERAL VASCULAR INTERVENTION;  Surgeon: Gretta Lonni PARAS, MD;  [facility];  Service: Cardiovascular;  Laterality: Right;  sfa stent    PERIPHERAL VASCULAR INTERVENTION   07/05/2022    Procedure: PERIPHERAL VASCULAR INTERVENTION;  Surgeon: Gretta Lonni PARAS, MD;  [facility];  Service: Cardiovascular;;    PERIPHERAL VASCULAR INTERVENTION   08/02/2022    Procedure: PERIPHERAL VASCULAR INTERVENTION;  Surgeon: Gretta Lonni PARAS, MD;  [facility];  Service: Cardiovascular;;   PERIPHERAL VASCULAR INTERVENTION   08/06/2022    Procedure: PERIPHERAL VASCULAR INTERVENTION;  Surgeon: Gretta Lonni PARAS, MD;  Location: [facility] INVASIVE CV LAB;  Service: Cardiovascular;;   PERIPHERAL VASCULAR THROMBECTOMY   08/02/2022    Procedure: PERIPHERAL VASCULAR THROMBECTOMY;  Surgeon: Gretta Lonni PARAS, MD;  [facility];  Service: Cardiovascular;;   REVISION AMPUTATION, BELOW THE KNEE Left 10/23/2023    Procedure: REVISION AMPUTATION, BELOW THE KNEE;  Surgeon: Harden Jerona GAILS, MD;  Location: [facility] OR;  Service: Orthopedics;  Laterality: Left;  REVISION LEFT BELOW KNEE AMPUTATION   RIGHT/LEFT HEART CATH AND CORONARY ANGIOGRAPHY N/A 08/23/2022    Procedure: RIGHT/LEFT HEART CATH AND CORONARY ANGIOGRAPHY;  Surgeon: Dann Candyce RAMAN, MD;  Location: [facility] INVASIVE CV LAB;  Service: Cardiovascular;  Laterality: N/A;   SKIN SPLIT GRAFT Right 03/18/2020    Procedure: SKIN GRAFTING RIGHT FOOT ULCER;  Surgeon: Harden Jerona GAILS, MD;  Location: [facility] OR;  Service: Orthopedics;  Laterality: Right;   STUMP REVISION Right 11/30/2022    Procedure: REVISION RIGHT TRANSMETATARSAL AMPUTATION;  Surgeon: Harden Jerona GAILS, MD;  Location: [facility] OR;  Service: Orthopedics;  Laterality: Right;   TEE WITHOUT CARDIOVERSION N/A 08/27/2022    Procedure: TRANSESOPHAGEAL ECHOCARDIOGRAM;  Surgeon: Hobart Powell BRAVO, MD;  Location: [facility] INVASIVE CV LAB;  Service: Cardiovascular;  Laterality: N/A;   TRANSESOPHAGEAL ECHOCARDIOGRAM (CATH LAB) N/A 10/24/2023    Procedure: TRANSESOPHAGEAL ECHOCARDIOGRAM;  Surgeon: Michele Richardson, DO;  [facility]: [facility];  Service: Cardiovascular;  Laterality: N/A;   WISDOM TOOTH EXTRACTION              Assessment & Plan Clinical Impression: Patient is a 58 y.o. year old female with history of T2DM  with neuropathy, GERD, CAD with combined CHF and aortic stenosis, GIB due to AVM, tobacco use in the past (quit 6-7 years ago), RLS, PAD s/p transmetatarsal amputation bilateral feet last year with non healing wound on right who admitted to [facility] with lethargy, fever , AKI and hypotension due to sepsis from MRSA bacteremia and found to have purple area of discoloration on left leg with blistering. She was found to have lactic acidosis with elevated troponin, anemia requiring one unit PRBC and no clear source of infection found. She was transferred to [facility] for management and MRI LLE showed diffuse edema anterior and posterior compartment of left shin/calf extending from knee thorough ankle as well as deep intramuscular fascia as well as deep to medial gastroc muscle question cellulitis or fasciitis and blistering extending along left posteromedial ankle.  ID recommended  continuing broad spectrum antibiotics through surgery. Exam concerning for necrotizing fasciitis and she underwent L-BKA with open flab wrapped with wound VAC on 06/24 by Dr. Silver followed by revision BKA on 06/25 by Dr. Harden.      TEE done 06/26 revealing normal LVEF 60-65% which was negative for thrombus or endocarditis,  negative for shunting, showed  moderate to severe aortic stenosis w/mild to moderate AR, mild to moderate MR and mild layered plaque involving descending aorta. Antibiotics narrowed to IV Daptomycin  thru 11/17/23 --4 weeks from negative [facility] 06/22.  Mentation has improved with improvement in pain but continues to have phantom pain also. PT/OT consulted and has been working with patient who requires mod assist X 2 for stand pivot transfers and +2 mod assist with ADLs. She lives alone in [place], was independent and working PTA. CIR recommended due to functional decline.     Pt presents with decreased activity tolerance, decreased functional mobility, decreased balance, difficulty maintaining precautions Limiting pt's  independence with leisure/community pursuits.  Met with pt today to discuss TR services including leisure education, activity analysis/modifications and stress management.   Plan  Min 1 Session >20 minutes during LOS  Recommendations for other services: None   Discharge Criteria: Patient will be discharged from TR if patient refuses treatment 3 consecutive times without medical reason.  If treatment goals not met, if there is a change in medical status, if patient makes no progress towards goals or if patient is discharged from hospital.  The above assessment, treatment plan, treatment alternatives and goals were discussed and mutually agreed upon: by patient  Jaasiel Hollyfield 11/07/2023, 12:03 PM

## 2023-11-07 NOTE — Progress Notes (Signed)
 PROGRESS NOTE   Subjective/Complaints: Pt feels better with the addnl lasix . Swelling down in right leg. Still has vac on this morning.   ROS: Patient denies fever, rash, sore throat, blurred vision, dizziness, nausea, vomiting, diarrhea, cough, shortness of breath or chest pain, joint or back/neck pain, headache, or mood change. .   Objective:   DG Chest 2 View Result Date: 11/05/2023 CLINICAL DATA:  Shortness of breath EXAM: CHEST - 2 VIEW COMPARISON:  11/02/2023 FINDINGS: Left PICC line is in place. The catheter now courses into the right innominate vein with the tip likely in the right internal jugular vein. Cardiomegaly, vascular congestion. Diffuse bilateral airspace disease, worsening since prior study. No effusions or pneumothorax. No acute bony abnormality. IMPRESSION: Left PICC line now courses into the right innominate vein with the tip in the right internal jugular vein. Worsening diffuse bilateral airspace disease, likely edema although infection not excluded. Electronically Signed   By: Franky Crease M.D.   On: 11/05/2023 20:30    Recent Labs    11/05/23 0330 11/07/23 0434  WBC  --  5.0  HGB 7.1* 7.0*  HCT 23.3* 23.5*  PLT  --  255   Recent Labs    11/06/23 0246 11/07/23 0434  NA 136 140  K 3.8 3.9  CL 103 106  CO2 26 25  GLUCOSE 150* 136*  BUN 28* 29*  CREATININE 1.14* 1.17*  CALCIUM  8.0* 8.3*     Intake/Output Summary (Last 24 hours) at 11/07/2023 1005 Last data filed at 11/06/2023 1343 Gross per 24 hour  Intake 240 ml  Output --  Net 240 ml        Physical Exam: Vital Signs Blood pressure 109/61, pulse 94, temperature 98 F (36.7 C), temperature source Oral, resp. rate 18, height 5' 4 (1.626 m), weight 95.2 kg, SpO2 94%. Constitutional: No distress . Vital signs reviewed. HEENT: NCAT, EOMI, oral membranes moist Neck: supple Cardiovascular: RRR without murmur. No JVD    Respiratory/Chest: CTA  Bilaterally without wheezes or rales. Normal effort    GI/Abdomen: BS +, non-tender, non-distended Ext: no clubbing, cyanosis, or 1+ RLE edema Psych: pleasant and cooperative  Musculoskeletal:     Cervical back: Neck supple. No tenderness.      Skin:    General: Skin is warm and dry.     Comments: Small scabbed area latera aspect of right transmetatarsal site    Left BK still in VAC with  seal still no drainage in cannister . broken blister- large ~ 3 inches in diameter on medial posterior aspect of L thigh from LLE limb guard L posterior/medial above knee/on thigh- rubbed area from limb guard- abraded skin in shape of top of limb guard -resolving.     Neurological:     Mental Status: She is alert and oriented to person, place, and time.     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 Prior neuro assessment is c/w 11/07/2023 exam.      Assessment/Plan: 1. Functional deficits which  require 3+ hours per day of interdisciplinary therapy in a comprehensive inpatient rehab setting. Physiatrist is providing close team supervision and 24 hour management of active medical problems listed below. Physiatrist and rehab team continue to assess barriers to discharge/monitor patient progress toward functional and medical goals  Care Tool:  Bathing    Body parts bathed by patient: Right arm, Left arm, Chest, Abdomen, Left upper leg, Face, Front perineal area, Buttocks, Right upper leg, Right lower leg   Body parts bathed by helper: Right lower leg, Front perineal area, Buttocks Body parts n/a: Left lower leg   Bathing assist Assist Level: Supervision/Verbal cueing     Upper Body Dressing/Undressing Upper body dressing   What is the patient wearing?: Pull over shirt    Upper body assist Assist Level: Independent with assistive device    Lower Body  Dressing/Undressing Lower body dressing      What is the patient wearing?: Underwear/pull up, Pants     Lower body assist Assist for lower body dressing: Minimal Assistance - Patient > 75%     Toileting Toileting    Toileting assist Assist for toileting: Maximal Assistance - Patient 25 - 49%     Transfers Chair/bed transfer  Transfers assist     Chair/bed transfer assist level: Contact Guard/Touching assist     Locomotion Ambulation   Ambulation assist   Ambulation activity did not occur: Safety/medical concerns (pain, decreased balance, wound on R heel)          Walk 10 feet activity   Assist  Walk 10 feet activity did not occur: Safety/medical concerns (pain, decreased balance, wound on R heel)        Walk 50 feet activity   Assist Walk 50 feet with 2 turns activity did not occur: Safety/medical concerns (pain, decreased balance, wound on R heel)         Walk 150 feet activity   Assist Walk 150 feet activity did not occur: Safety/medical concerns (pain, decreased balance, wound on R heel)         Walk 10 feet on uneven surface  activity   Assist Walk 10 feet on uneven surfaces activity did not occur: Safety/medical concerns (pain, decreased balance, wound on R heel)         Wheelchair     Assist Is the patient using a wheelchair?: Yes Type of Wheelchair: Manual    Wheelchair assist level: Independent Max wheelchair distance: 75    Wheelchair 50 feet with 2 turns activity    Assist        Assist Level: Independent   Wheelchair 150 feet activity     Assist      Assist Level: Independent   Blood pressure 109/61, pulse 94, temperature 98 F (36.7 C), temperature source Oral, resp. rate 18, height 5' 4 (1.626 m), weight 95.2 kg, SpO2 94%.   Medical Problem List and Plan: 1. 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  supervision goals         -Continue CIR therapies including PT and OT. Interdisciplinary team conference today to discuss goals, barriers to discharge, and dc planning.   Grounds pass ordered     2.  Antithrombotics: -DVT/anticoagulation:  Pharmaceutical: continue Lovenox              -  antiplatelet therapy: continue plavix .   3. Pain Management: continue Oxycodone  prn.   -on gabapentin  200mg  tid for phantom pain, RLS  -pain seems controlled 4. Mood/Behavior/Sleep: LCSW to follow for evaluation and support.               -antipsychotic agents: n/a 5. Neuropsych/cognition: This patient is capable of making decisions on her own behalf. 6. Skin/Wound Care: Pressure relief measures. Protein supplements added. Monitor for recurrent drainage.             --foam dressing to area on left thigh.   -dc vac today 7/10--forgot to order yesterday  -local care to other wounds 7. Fluids/Electrolytes/Nutrition: Monitor I/O.   8.  MRSA bacteremia: Last CK 522-->continue Daptomycin  with EOT  11/17/23             --weekly CRP, Sed rate, CPK in addition to CBC/BMP.             --follow up with Dr. Dea 07/17 @ 9:30 am prior to EOT. Zinc , Vitamin C , Vitamin D  supplements started  -afebrile, clinically stable 9. T2DM on chronic insulin : Hgb A1C- 5.5 and well controlled (down from 6.4 earlier this year). Monitor BS ac/hs and use SSI for elevated BS.              -continue insulin  glargine 10 units and SSI was added today  D/c HS SSI, improved control over all. --consider increasing lantus --monitor for now  CBG (last 3)  Recent Labs    11/06/23 1633 11/06/23 2021 11/07/23 0636  GLUCAP 158* 141* 125*    10. Severe PAD s/p stents: On Plavix  and atorvastatin .  Monitor chronically draining area rea on right transmet site.   11. 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   -7/7 hgb down to 7.0---asymptomatic  -stools recently heme  negative  -7/10 hgb 7.0, stable   12.  CAD/HFpEF w/EF  - EF 60-65%: Elevated troponin felt to be due to demand ischemia --Down to 501 on 06/23.   Lasix  40mg  BID restarted Saturday for swelling pulmonary edema  RLE edema-RLE TED stocking f   -lasix  as above.   -gabapentin  reduced to 200mg  tid -7/10 weight down to 95kg  -will try lasix  40mg  po tid today  -reassess in am  -K+ supp  -renal fxn holding steady Filed Weights   11/05/23 0811 11/06/23 0600 11/07/23 0624  Weight: 98.4 kg 98.3 kg 95.2 kg    13.   Mod to severe Aortic stenosis/Moderate MR: Strict I/O w/daily weights. Monitor for signs of overload. Lasix   restarted  14.  Severe malnutrition: Albumin  level <1.5 w/ calcium  7.6-->corrects to 9.5. RD consulted  -improvement here will also help swelling.  -alb now 2.0 15.  Abnormal LFTs: resolved  18. AKI: continue to monitor creatinine given increase in lasix   -7/10-- stable  LOS: 9 days A FACE TO FACE EVALUATION WAS PERFORMED  Kayla Schmitt 11/07/2023, 10:05 AM

## 2023-11-07 NOTE — Progress Notes (Signed)
 Patient ID: Kayla Schmitt, female   DOB: January 11, 1966, 58 y.o.   MRN: [id number]  Met with pt and her mom to give team conference update regarding progress toward her goals and discharge still 7/14. Both feel she will be ready by then as long as medical issues stable.

## 2023-11-07 NOTE — Progress Notes (Signed)
 Physical Therapy Session Note  Patient Details  Name: Kayla Schmitt MRN: [id number] Date of Birth: 21-Apr-1966  Today's Date: 11/06/2023 PT Individual Time: 0931-1000 PT Individual Time Calculation (min): 29 min  Short Term Goals: Week 1:  PT Short Term Goal 1 (Week 1): pt will transfer bed<>chair with LRAD and CGA PT Short Term Goal 2 (Week 1): pt will transfer sit<>stand with LRAD and CGA PT Short Term Goal 3 (Week 1): pt will perform simulated car transfer with LRAD and CGA  Skilled Therapeutic Interventions/Progress Updates:  Patient seated upright in w/c on entrance to room. Patient alert and agreeable to PT session.   Patient with no pain complaint at start of session.  Relates recent medical hx leading up to amputation surgery. Indicates ankle instability d/t inversion of remaining R foot at ankle. Pt agreeable to try. Will discuss with lead therapist.  Also discussed home setup and expected d/c plan. Pt's home has had adjustments made over past few years and is mostly w/c accessible. Pt does have plans for prosthetic use after healing. Relates that no one has come yet after surgery to inspect surgical site. Already has home/ portable wound vac in use. No drainage noted in unit and pt ready for wound vac to be discontinued. Pt educated re: use of wound vac for excess drainage from surgical site as well as for use in maintaining approximation of skin ends for increased healing. Educated on usual removal of staples/ stitches between 2-3 wks post-op but wound vac may stay on for add'l time to approximate skin edges for additional healing d/t pt's T2DM dx. Pt relates understanding.   Pt propelled wheelchair >200' x2 with BUE under supervision. Manages w/c well and is able to maneuver through cones placed 5 ft apart as well as maneuver w/c by backing into 4.5' space requiring no cueing to complete. Minimal education re: hand placement to wheel rim no further back than 12 o'clock to  forward propel. After discussion of non-ADA ramp at home, pt demos ability to pull self up ramp in ortho gym using handrails and can control descent with hands on wheelrims.   At mat table, pt is able to rise to stand with hands on elevated mat from w/c with CGA/ light MinA into modified quadruped. Is able to maintain stance to RLE and alternate hand taps to contralateral shoulders x10 each side. Balance is difficult with RUE/ RLE support, lightly improved with LUE/ RLE support.   Patient seated upright in w/c at end of session with brakes locked, belt alarm set, and all needs within reach.   Therapy Documentation Precautions:  Precautions Precautions: Fall Precaution/Restrictions Comments: L BKA with wound vac Restrictions Weight Bearing Restrictions Per Provider Order: Yes LLE Weight Bearing Per Provider Order: Non weight bearing  Pain: No pain related by pt during this session.    Therapy/Group: Individual Therapy  Mliss DELENA Milliner PT, DPT, CSRS 11/06/2023, 12:08 PM

## 2023-11-07 NOTE — Progress Notes (Signed)
 Physical Therapy Session Note  Patient Details  Name: Tranise Forrest MRN: [id number] Date of Birth: [birth year]  Today's Date: 11/07/2023 PT Individual Time: 1000-1058 PT Individual Time Calculation (min): 58 min   Short Term Goals: Week 2:  PT Short Term Goal 1 (Week 2): STG=LTG due to LOS  Skilled Therapeutic Interventions/Progress Updates:   Treatment Session 1   Pt seated EOB upon arrival. Pt agreeable to therapy. Pt denies any pain.   Nurse present to remove wound vac, therapist provided for breathing in through nose/out through mouth for pain management.   Pt has mild bleeding, nurse donned gauze pad. Therapist Donned shrinker with total A over gauze.   Treatment session focused on dynamic standing balance. Pt stood with RW and unilateral UE support with CGA and performed mutidirectional/cross body reaching while playing cornhole. Pt able to stand for entire game each time, pt required seated rest breaks between games 2/2 fatigue. pt required intermittent L UE support on RW to steady onself when reachign with L UE.  Pt performed lateral transfer bed to WC, and WC to [facility] with CGA/close supervision, vebral cues provided for proper positioning of WC (to close the gap) for WC to [facility].   Education/demonstration provided for how to wash shrinker.   Pt seated on BSC at end of session, notified nurse.       Therapy Documentation Precautions:  Precautions Precautions: Fall Precaution/Restrictions Comments: L BKA with wound vac Restrictions Weight Bearing Restrictions Per Provider Order: Yes LLE Weight Bearing Per Provider Order: Non weight bearing  Therapy/Group: Individual Therapy  [place] Doreene Orris, [place], DPT  11/07/2023, 10:48 AM

## 2023-11-07 NOTE — Progress Notes (Signed)
 Physical Therapy Session Note  Patient Details  Name: Kayla Schmitt MRN: [id number] Date of Birth: 12/01/1965  Today's Date: 11/06/2023 PT Individual Time: 1505-1602 PT Individual Time Calculation (min): 57 min  Short Term Goals: Week 1:  PT Short Term Goal 1 (Week 1): pt will transfer bed<>chair with LRAD and CGA PT Short Term Goal 2 (Week 1): pt will transfer sit<>stand with LRAD and CGA PT Short Term Goal 3 (Week 1): pt will perform simulated car transfer with LRAD and CGA  Skilled Therapeutic Interventions/Progress Updates:  Patient seated upright in w/c on entrance to room. Patient alert and agreeable to PT session. Mother present.   Patient with no pain complaint at start of session.  Therapeutic Activity: Dons limb guard with setup. Discussed ASO and need for proper fitting prior to ordering. Msg sent to lead therapist and Ortho Tech supervisor  re: measurements provided for most proper fit prior to placing order.   Transported to main therapy gym dependently via w/c for time/ strength. In // bars, attempted stance in order to assess UE strength and ability to support self through BUE with decreasing WB to RLE in // bars. After several tries, pt unable to fully WB into BUE and lift R foot from floor. Pt provided with lateral and anterior support to knee and ankle to prevent buckle of knee/ roll of ankle.   After attempts, pt relates desire to strengthen arms in order to improve ability to maintain stance prior to eventual stance/ ambulation with prosthetic. Guided in arm therex with focus on scapular stability. Provided with yellow t-band and performs: - overhead scapular push-up and slow eccentric return (elevation/ depression)  - scap sets with resistance  - Is, Ts, and Ys as well as, - PNF D2 shoulder flexion with contralateral hold of t-band and additional focus on scapular depression and rotation at end of shoulder ROM.   All for improved shoulder stability.  Appreciative of therex to continue strengthening between sessions.   Patient seated upright in w/c at end of session with brakes locked, no alarm set d/t pt's high cognitive awareness, and all needs within reach.  Therapy Documentation Precautions:  Precautions Precautions: Fall Precaution/Restrictions Comments: L BKA with wound vac Restrictions Weight Bearing Restrictions Per Provider Order: Yes LLE Weight Bearing Per Provider Order: Non weight bearing  Pain: No pain related this session.    Therapy/Group: Individual Therapy  Mliss DELENA Milliner PT, DPT, CSRS 11/06/2023, 4:51 PM

## 2023-11-07 NOTE — Progress Notes (Signed)
 Informed by therapy patient was short of breath while in therapy session. Patient brought back to room. Vitals are stable , placed on 2L [place] . PA notified and new orders obtained .     Nat Hacker LPN

## 2023-11-07 NOTE — Progress Notes (Signed)
 Occupational Therapy Session Note  Patient Details  Name: Kayla Schmitt MRN: [id number] Date of Birth: January 15, 1966  Today's Date: 11/07/2023 OT Individual Time: 9169-9084 OT Individual Time Calculation (min): 45 min    Short Term Goals: Week 1:  OT Short Term Goal 1 (Week 1): Pt will complete 2/3 toileting steps with CGA for balance OT Short Term Goal 1 - Progress (Week 1): Met OT Short Term Goal 2 (Week 1): Pt will complete sit > stand in prep for ADL with CGA OT Short Term Goal 2 - Progress (Week 1): Met OT Short Term Goal 3 (Week 1): Pt will complete toilet transfer with CGA OT Short Term Goal 3 - Progress (Week 1): Met Week 2:  OT Short Term Goal 1 (Week 2): STG = LTG due to ELOS    Skilled Therapeutic Interventions/Progress Updates:    Pt received in w/c ready for therapy.  Discussed with pt her bathroom toilet set up with the vanity wall.  Had pt look up on her phone a grab bar that attaches from back wall to the floor (a floor install bar).  That could be placed just adjacent to the vanity wall. Pt liked that idea. Nursing stated she needed to be back in bed to have wound vac removed at end of session.  To prepare for that had pt go ahead and do her oral care and UB bathing at the sink which she completes independently in the chair.  Pt demonstrated how she backs up the w/c so feet facing end of bed to pivot.  As pt started the pivot the chair slid and therapist had to hold the chair carefully.  Discussed and demonstrated safe pivot transfers by pushing down with a forward lean to fully lift hips vs pushing away into the chair and sliding hips.  Also recommended she have her feet facing HOB as it will allow her to be in a closer position to [facility] and she will have more flexibility with scooting the w/c out of the way when needed.   Pt practiced the squat pivot w/c to bed to her L 2 x total with close S and no w/c sliding.   Pt then practiced the forward lean to push up from bed  to walker for static standing. Encouraged her to reach back for bed to sit. Pt practiced 2x with close S. Pt is very motivated and participates well. Sitting EOB waiting for RN.   Therapy Documentation Precautions:  Precautions Precautions: Fall Precaution/Restrictions Comments: L BKA with wound vac Restrictions Weight Bearing Restrictions Per Provider Order: Yes LLE Weight Bearing Per Provider Order: Non weight bearing   Vital Signs: Therapy Vitals Temp: 98 F (36.7 C) Temp Source: Oral Pulse Rate: 94 Resp: 18 BP: 109/61 Patient Position (if appropriate): Lying Oxygen  Therapy SpO2: 94 % O2 Device: Room Air Pain: Pain Assessment Pain Score: 0-No pain    Therapy/Group: Individual Therapy  Bexley Mclester 11/07/2023, 8:53 AM

## 2023-11-07 NOTE — Progress Notes (Signed)
 Orthopedic Tech Progress Note Patient Details:  Kayla Schmitt [birth year] [id number]  Ortho Devices Type of Ortho Device: Ankle Air splint Ortho Device/Splint Location: RLE Ortho Device/Splint Interventions: Ordered, Application, Adjustment   Post Interventions Patient Tolerated: Well Instructions Provided: Adjustment of device Tried to put on lace up ASO, ASO did not accommodate to patient's RLE. Consulted with OT/PT, decided that an air cast ankle brace would be best if the XL lace up did not fit.  [name] 11/07/2023, 7:45 PM

## 2023-11-07 NOTE — Progress Notes (Signed)
 Physical Therapy Session Note  Patient Details  Name: Kayla Schmitt MRN: [id number] Date of Birth: Jul 01, 1965  Today's Date: 11/07/2023 PT Individual Time: [phone] and 1300-1355 PT Individual Time Calculation (min): 39 min and 55 min  Short Term Goals: Week 1:  PT Short Term Goal 1 (Week 1): pt will transfer bed<>chair with LRAD and CGA PT Short Term Goal 2 (Week 1): pt will transfer sit<>stand with LRAD and CGA PT Short Term Goal 3 (Week 1): pt will perform simulated car transfer with LRAD and CGA  Skilled Therapeutic Interventions/Progress Updates:   Treatment Session 1 Received pt sitting on commode, pt agreeable to PT treatment, and reported pain in residual limb was good. Session with emphasis on functional mobility/transfers, generalized strengthening and endurance, and dynamic standing balance/coordination. Donned shorts sitting on commode and limb guard with supervision and increased time, then performed transfer commode<>WC via lateral scoot with supervision with assist to stabilize WC. Pt performed WC mobility 115ft x 1 and >211ft x 1 using BUE and supervision/mod I throughout session with emphasis on UE strength/coordination.   Pt reported getting new bedframe that is 5in lower and equal height of WC for ease with transfers. Pt concerned about couch being 18in and her WC being 20in and transferring uphill - transported to rehab apartment and practiced furniture transfer on/off 18in couch from 20in WC with close supervision, cues for hand placement, and assist to stabilize WC. Pt relying on UE support to pull herself into chair and discussed importance of having her mom there to stabilize WC. Stood from [facility] with RW and CGA fading to close supervision x 5 trials with emphasis on quad strength and eccentric control - pt with good recall of hand placement. Recommended purchasing reacher for home use and discussed avoiding standing/pivoting at home due to tendency for ankle to roll  - pt in agreement. Returned to room and concluded session with pt sitting in [facility] with all needs within reach.  Treatment Session 2 Received pt sitting in [facility] with mother present for family education training. Pt agreeable to PT treatment and did not state pain level. Session with emphasis on functional mobility/transfers, generalized strengthening and endurance, and simulated car transfers. Pt performed WC mobility 158ft using BUE and supervision/mod I with emphasis on UE strength/coordination and transported remainder of way to ortho gym dependently due to fatigue.  Pt performed simulated car transfer via stand<>pivot without AD and supervision with pt's mother stabilizing WC. Pt then performed WC mobility additional 31ft using BUE and supervision/mod I to rehab apartment and practiced furniture transfer on/off 18in couch to simulate couch at home with supervision and pt's mom stabilizing WC.  Performed WC mobility final 126ft using BUE and supervision/mod I back towards room and transported remainder of way back to room due to fatigue. Pt c/o mild SOB during session that improved slightly with seated rest and pursed lip breathing - SPO2 95% and HR 110bpm (RN and PA notified). Pt reports fluid accumulation in RLE has improved but still with edema - recommended wearing compression sock at home. Removed shrinker and noted mild drainage from lateral aspect of residual limb. Removed gauze and educated pt/mother on importance of wearing shrinker directly over insicion as well as proper technique for donning and cleaning shrinker. [name] assisted in donning 4XL shrinker and pt able to don/doff limb guard with setup assist. Ortho tech arrived and therapist assisted with donning ASO, but not fitting properly - pt may benefit from aircast instead. Concluded session with  pt sitting in WC with all needs within reach. RN notified of pt's SOB.   Therapy Documentation Precautions:  Precautions Precautions:  Fall Precaution/Restrictions Comments: L BKA with wound vac Restrictions Weight Bearing Restrictions Per Provider Order: Yes LLE Weight Bearing Per Provider Order: Non weight bearing  Therapy/Group: Individual Therapy Therisa HERO Zaunegger Therisa Stains PT, DPT 11/07/2023, 7:04 AM

## 2023-11-08 ENCOUNTER — Inpatient Hospital Stay (HOSPITAL_COMMUNITY)

## 2023-11-08 DIAGNOSIS — I350 Nonrheumatic aortic (valve) stenosis: Secondary | ICD-10-CM

## 2023-11-08 DIAGNOSIS — J810 Acute pulmonary edema: Secondary | ICD-10-CM

## 2023-11-08 DIAGNOSIS — R609 Edema, unspecified: Secondary | ICD-10-CM

## 2023-11-08 DIAGNOSIS — K922 Gastrointestinal hemorrhage, unspecified: Secondary | ICD-10-CM

## 2023-11-08 LAB — CBC WITH DIFFERENTIAL/PLATELET
Abs Immature Granulocytes: 0.04 K/uL (ref 0.00–0.07)
Basophils Absolute: 0 K/uL (ref 0.0–0.1)
Basophils Relative: 0 %
Eosinophils Absolute: 0.7 K/uL — ABNORMAL HIGH (ref 0.0–0.5)
Eosinophils Relative: 10 %
HCT: 23.4 % — ABNORMAL LOW (ref 36.0–46.0)
Hemoglobin: 7.3 g/dL — ABNORMAL LOW (ref 12.0–15.0)
Immature Granulocytes: 1 %
Lymphocytes Relative: 24 %
Lymphs Abs: 1.7 K/uL (ref 0.7–4.0)
MCH: 29 pg (ref 26.0–34.0)
MCHC: 31.2 g/dL (ref 30.0–36.0)
MCV: 92.9 fL (ref 80.0–100.0)
Monocytes Absolute: 0.7 K/uL (ref 0.1–1.0)
Monocytes Relative: 10 %
Neutro Abs: 4 K/uL (ref 1.7–7.7)
Neutrophils Relative %: 55 %
Platelets: 301 K/uL (ref 150–400)
RBC: 2.52 MIL/uL — ABNORMAL LOW (ref 3.87–5.11)
RDW: 17.9 % — ABNORMAL HIGH (ref 11.5–15.5)
WBC: 7.2 K/uL (ref 4.0–10.5)
nRBC: 0 % (ref 0.0–0.2)

## 2023-11-08 LAB — BASIC METABOLIC PANEL WITH GFR
Anion gap: 9 (ref 5–15)
BUN: 31 mg/dL — ABNORMAL HIGH (ref 6–20)
CO2: 28 mmol/L (ref 22–32)
Calcium: 8.6 mg/dL — ABNORMAL LOW (ref 8.9–10.3)
Chloride: 102 mmol/L (ref 98–111)
Creatinine, Ser: 1.14 mg/dL — ABNORMAL HIGH (ref 0.44–1.00)
GFR, Estimated: 56 mL/min — ABNORMAL LOW (ref >60)
Glucose, Bld: 132 mg/dL — ABNORMAL HIGH (ref 70–99)
Potassium: 3.9 mmol/L (ref 3.5–5.1)
Sodium: 139 mmol/L (ref 135–145)

## 2023-11-08 LAB — GLUCOSE, CAPILLARY
Glucose-Capillary: 118 mg/dL — ABNORMAL HIGH (ref 70–99)
Glucose-Capillary: 114 mg/dL — ABNORMAL HIGH (ref 70–99)
Glucose-Capillary: 175 mg/dL — ABNORMAL HIGH (ref 70–99)
Glucose-Capillary: 149 mg/dL — ABNORMAL HIGH (ref 70–99)

## 2023-11-08 LAB — PREPARE RBC (CROSSMATCH)

## 2023-11-08 MED ORDER — ACETAMINOPHEN 500 MG PO TABS
1000.0000 mg | ORAL_TABLET | Freq: Four times a day (QID) | ORAL | Status: DC | PRN
  Administered 2023-11-08: 1000 mg via ORAL
  Filled 2023-11-08: qty 2

## 2023-11-08 MED ORDER — SODIUM CHLORIDE 0.9% FLUSH
10.0000 mL | Freq: Two times a day (BID) | INTRAVENOUS | Status: DC
  Administered 2023-11-09 – 2023-11-12 (×5): 10 mL

## 2023-11-08 MED ORDER — DIPHENHYDRAMINE HCL 25 MG PO CAPS
25.0000 mg | ORAL_CAPSULE | Freq: Once | ORAL | Status: AC
  Administered 2023-11-08: 25 mg via ORAL
  Filled 2023-11-08: qty 1

## 2023-11-08 MED ORDER — ACETAMINOPHEN 325 MG PO TABS
650.0000 mg | ORAL_TABLET | Freq: Once | ORAL | Status: AC
  Administered 2023-11-08: 650 mg via ORAL
  Filled 2023-11-08: qty 2

## 2023-11-08 MED ORDER — SODIUM CHLORIDE 0.9% IV SOLUTION: Freq: Once | INTRAVENOUS | Status: AC

## 2023-11-08 MED ORDER — FAMOTIDINE IN NACL 20-0.9 MG/50ML-% IV SOLN: 20.0000 mg | Freq: Once | INTRAVENOUS | Status: DC

## 2023-11-08 MED ORDER — FUROSEMIDE 10 MG/ML IJ SOLN
20.0000 mg | Freq: Once | INTRAMUSCULAR | Status: AC
  Administered 2023-11-08: 20 mg via INTRAVENOUS
  Filled 2023-11-08: qty 2

## 2023-11-08 MED ORDER — SODIUM CHLORIDE 0.9% FLUSH: 10.0000 mL | INTRAVENOUS | Status: DC | PRN

## 2023-11-08 MED ORDER — FAMOTIDINE 40 MG/4ML IV SOLN
20.0000 mg | Freq: Once | INTRAVENOUS | Status: AC
  Administered 2023-11-08: 20 mg via INTRAVENOUS
  Filled 2023-11-08: qty 2

## 2023-11-08 NOTE — Progress Notes (Signed)
 Occupational Therapy Session Note  Patient Details  Name: Kayla Schmitt MRN: [id number] Date of Birth: 1965/05/03  Today's Date: 11/08/2023 OT Individual Time: 1020-1115 & 1450-1530 OT Individual Time Calculation (min): 55 min & 40 min   Short Term Goals: Week 2:  OT Short Term Goal 1 (Week 2): STG = LTG due to ELOS  Skilled Therapeutic Interventions/Progress Updates:  Session 1 Skilled OT intervention completed with focus on activity tolerance, education and BUE strengthening. Pt received seated in w/c. Pt desired a shower but requested blood transfusion first for better energy. No pain reported.  Nurse notified of OT plan for shower in PM, and present for connecting blood unit. [name] and pt with questions and concerns about pt's repeated SOB with exertion and upcoming DC. We discussed at length, the heart's purpose, challenges associated with heart failure (per pt report she has this), and offered suggestions for energy conservation such as spacing out ADLs/shower from appointments, taking rest breaks etc. Pt appreciative of this discussion however mother still unsettled wanting pt to be fixed prior to DC. Discussed that pt's heart failure symptoms may not be fixable as in healed but better managed with modifications and medications as discussed. Care team aware and PA frequently visiting for follow up per pt and family.   Completed simple pericare bathing with setup A, donned underwear with min A for over L buttock only with pt pushing up using BUE on arm rests. Seated in w/c, pt completed the following BUE exercises to promote BUE strength/endurance needed for independence with functional transfers and BADLs: (With yellow theraband) 12 reps Horizontal abduction Shoulder external rotation Shoulder diagonal pulls  Direct care handoff to rehab tech for transport to group session.  Session 2 Skilled OT intervention completed with focus on ADL retraining, functional  endurance, and mobility within a shower context. Pt received seated in w/c, agreeable to session. No pain reported.  Pt completed all squat pivot transfers with grab bar with CGA from w/c <> TTB in shower. Min cues needed for locking brakes, and sequencing.  Waterproof cover applied to LUE PICC and L BKA prior to shower with education provided on how to purchase materials for adequate coverage if going home with PICC. Pt was able to bathe all parts with set up A, at the seated level only. Able to donn gown/deo with mod I. OT rewrapped R heel bandages due to soilage, applied foam dressing to small skin tear (present prior to therapy) that pt scratched in shower with nurse aware. Pt donned L BKA shrinker with set up A. Discussed if wound continues to drain onto support pad etc, to ask nursing to apply additional layer I.e. guaze and ACE to outside of shrinker.   Cardiologist present for assessment. Pt remained seated in w/c, with mother present and with all needs in reach at end of session.    Therapy Documentation Precautions:  Precautions Precautions: Fall Precaution/Restrictions Comments: L BKA Restrictions Weight Bearing Restrictions Per Provider Order: Yes LLE Weight Bearing Per Provider Order: Non weight bearing    Therapy/Group: Individual Therapy  Lorrayne FORBES Fritter, MS, OTR/L  11/08/2023, 3:40 PM

## 2023-11-08 NOTE — Progress Notes (Signed)
 Physical Therapy Session Note  Patient Details  Name: Kayla Schmitt MRN: [id number] Date of Birth: [birth year]  Today's Date: 11/08/2023 PT Individual Time: 0731-0825 PT Individual Time Calculation (min): 54 min   Short Term Goals: Week 1:  PT Short Term Goal 1 (Week 1): pt will transfer bed<>chair with LRAD and CGA PT Short Term Goal 1 - Progress (Week 1): Met PT Short Term Goal 2 (Week 1): pt will transfer sit<>stand with LRAD and CGA PT Short Term Goal 2 - Progress (Week 1): Progressing toward goal PT Short Term Goal 3 (Week 1): pt will perform simulated car transfer with LRAD and CGA PT Short Term Goal 3 - Progress (Week 1): Met Week 2:  PT Short Term Goal 1 (Week 2): STG=LTG due to LOS  Skilled Therapeutic Interventions/Progress Updates:   Received pt semi-reclined in bed with IV team changing dressing and RN present to administer medication - mother present at bedside. Pt reported after therapy yesterday, she was SOB for a few hours after but feeling better today. Pt on 2L - titrated to RA and SPO2 97% and pt asymptomatic (cleared by RN). Pt agreeable to PT treatment and reported pain in residual limb was good (premedicated). Pt reported PA, Pam, recommended against intensive therapy in the gym this morning, and encouraging pt to rest - however, no orders in chart and pt reporting urge to toilet. Donned R ted hose with min A for edema management and discussed fitting trial for various ankle braces yesterday. Pt reported L ASO was too tight and and aircast would not fit into the shoe - pt requested to keep XL ASO thinking it will fit better once edema decreased (notified ortho tech to pick up L ASO and aircast to return).  Pt requesting to be cleared for toilet transfers with mother. Transferred to sitting EOB mod I and donned L limb guard and R shoe mod I. Pt transferred on/off bedside commode via lateral scoot with supervision with mother assisting with stabilizing commode -  cleared pt's mother to assist with toilet transfers and safety plan updated. Pt able to void and perform hygiene management with increased time. Discussed amputee group later this morning and topics that will be discussed further in detail. Pt requesting to shower with OT, then change shrinkers afterwards - primary OT notified. Concluded session with pt sitting EOB with all needs within reach and MD present at bedside.    Therapy Documentation Precautions:  Precautions Precautions: Fall Precaution/Restrictions Comments: L BKA with wound vac Restrictions Weight Bearing Restrictions Per Provider Order: Yes LLE Weight Bearing Per Provider Order: Non weight bearing  Therapy/Group: Individual Therapy Therisa HERO Zaunegger Therisa Stains PT, DPT 11/08/2023, 6:53 AM

## 2023-11-08 NOTE — Progress Notes (Signed)
 Recreational Therapy Discharge Summary Patient Details  Name: Kayla Schmitt MRN: [id number] Date of Birth: [birth year] Today's Date: 11/08/2023  Comments on progress toward goals: Pt is scheduled for discharge home 7/14.  TR sessions focused on pt education including leisure education, activity analysis/modifications & coping.  Pt remains extremely motivated & hopeful to return to previously enjoyed activities. Reasons for discharge: discharge from hospital  Follow-up: Outpatient  Patient/family agrees with progress made and goals achieved: Yes  Matty Deamer 11/08/2023, 12:22 PM

## 2023-11-08 NOTE — Progress Notes (Signed)
 PROGRESS NOTE   Subjective/Complaints: Pt had another episode of SOB. Data reviewed and discussed with pt. She feels great this morning. Appreciate cardiology eval. Pt feels generally weak. Stump incision with sl drainage.   ROS: Patient denies fever, rash, sore throat, blurred vision, dizziness, nausea, vomiting, diarrhea,   chest pain,  headache, or mood change.   Objective:   DG Chest 2 View Result Date: 11/07/2023 CLINICAL DATA:  [id number] Dyspnea on exertion [id number] EXAM: CHEST - 2 VIEW COMPARISON:  11/05/2023. FINDINGS: Redemonstration of diffuse alveolar and interstitial opacities throughout bilateral lungs, overall slightly improved since the prior study. Findings are nonspecific and differential diagnosis includes pulmonary edema, ARDS or multilobar pneumonia. There is subtle blunting of bilateral posterior costophrenic angles, favoring bilateral trace pleural effusions. No pneumothorax. Stable cardio-mediastinal silhouette. No acute osseous abnormalities. The soft tissues are within normal limits. IMPRESSION: *Redemonstration of diffuse alveolar and interstitial opacities throughout bilateral lungs, overall slightly improved since the prior study. Findings are nonspecific and differential diagnosis includes pulmonary edema, ARDS or multilobar pneumonia. Electronically Signed   By: Ree Molt M.D.   On: 11/07/2023 17:28    Recent Labs    11/07/23 0434 11/08/23 0410  WBC 5.0 7.2  HGB 7.0* 7.3*  HCT 23.5* 23.4*  PLT 255 301   Recent Labs    11/07/23 0434 11/08/23 0410  NA 140 139  K 3.9 3.9  CL 106 102  CO2 25 28  GLUCOSE 136* 132*  BUN 29* 31*  CREATININE 1.17* 1.14*  CALCIUM  8.3* 8.6*     Intake/Output Summary (Last 24 hours) at 11/08/2023 1003 Last data filed at 11/07/2023 1858 Gross per 24 hour  Intake 480 ml  Output --  Net 480 ml        Physical Exam: Vital Signs Blood pressure (!) 102/57, pulse 92,  temperature 98.3 F (36.8 C), resp. rate 18, height 5' 4 (1.626 m), weight 91.8 kg, SpO2 100%. Constitutional: No distress . Vital signs reviewed. HEENT: NCAT, EOMI, oral membranes moist Neck: supple Cardiovascular: RRR without murmur. No JVD    Respiratory/Chest: CTA Bilaterally without wheezes or rales. Normal effort    GI/Abdomen: BS +, non-tender, non-distended Ext: no clubbing, cyanosis, 1+ RLE edema Psych: pleasant and cooperative  Musculoskeletal:     Cervical back: Neck supple. No tenderness. Left BK swollen, tender. Wearing shrinker     Skin:    General: Skin is warm and dry.     Comments: Left BK incision well approximated with staples. Sl serosanguinous drainage from corners.    Neurological:     Mental Status: She is alert and oriented to person, place, and time.     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 Prior neuro assessment is c/w 11/08/2023 exam.      Assessment/Plan: 1. Functional deficits which require 3+ hours per day of interdisciplinary therapy in a comprehensive inpatient rehab setting. Physiatrist is providing close team supervision and 24 hour management of active medical problems listed below. Physiatrist and rehab team  continue to assess barriers to discharge/monitor patient progress toward functional and medical goals  Care Tool:  Bathing    Body parts bathed by patient: Right arm, Left arm, Chest, Abdomen, Left upper leg, Face, Front perineal area, Buttocks, Right upper leg, Right lower leg   Body parts bathed by helper: Right lower leg, Front perineal area, Buttocks Body parts n/a: Left lower leg   Bathing assist Assist Level: Supervision/Verbal cueing     Upper Body Dressing/Undressing Upper body dressing   What is the patient wearing?: Pull over shirt    Upper body assist Assist Level:  Independent with assistive device    Lower Body Dressing/Undressing Lower body dressing      What is the patient wearing?: Underwear/pull up, Pants     Lower body assist Assist for lower body dressing: Minimal Assistance - Patient > 75%     Toileting Toileting    Toileting assist Assist for toileting: Maximal Assistance - Patient 25 - 49%     Transfers Chair/bed transfer  Transfers assist     Chair/bed transfer assist level: Supervision/Verbal cueing     Locomotion Ambulation   Ambulation assist   Ambulation activity did not occur: Safety/medical concerns (pain, decreased balance, wound on R heel)          Walk 10 feet activity   Assist  Walk 10 feet activity did not occur: Safety/medical concerns (pain, decreased balance, wound on R heel)        Walk 50 feet activity   Assist Walk 50 feet with 2 turns activity did not occur: Safety/medical concerns (pain, decreased balance, wound on R heel)         Walk 150 feet activity   Assist Walk 150 feet activity did not occur: Safety/medical concerns (pain, decreased balance, wound on R heel)         Walk 10 feet on uneven surface  activity   Assist Walk 10 feet on uneven surfaces activity did not occur: Safety/medical concerns (pain, decreased balance, wound on R heel)         Wheelchair     Assist Is the patient using a wheelchair?: Yes Type of Wheelchair: Manual    Wheelchair assist level: Independent Max wheelchair distance: 75    Wheelchair 50 feet with 2 turns activity    Assist        Assist Level: Independent   Wheelchair 150 feet activity     Assist      Assist Level: Independent   Blood pressure (!) 102/57, pulse 92, temperature 98.3 F (36.8 C), resp. rate 18, height 5' 4 (1.626 m), weight 91.8 kg, SpO2 100%.   Medical Problem List and Plan: 1. 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  supervision goals         --Continue CIR therapies including PT, OT as tolerated     2.  Antithrombotics: -DVT/anticoagulation:  Pharmaceutical: continue Lovenox              -antiplatelet therapy: continue plavix .   3. Pain Management: continue Oxycodone  prn.   -on gabapentin  200mg  tid for phantom pain, RLS  -pain seems controlled 4. Mood/Behavior/Sleep: LCSW to follow for evaluation and support.               -  antipsychotic agents: n/a 5. Neuropsych/cognition: This patient is capable of making decisions on her own behalf. 6. Skin/Wound Care: Pressure relief measures. Protein supplements added. Monitor for recurrent drainage.  -vac off             --will place dry dressing over corner of incision until drainage ceases  -continue shrinker. Pt can don/doff herself 7. Fluids/Electrolytes/Nutrition: Monitor I/O.   8.  MRSA bacteremia: Last CK 522-->continue Daptomycin  with EOT  11/17/23             --weekly CRP, Sed rate, CPK in addition to CBC/BMP.             --follow up with Dr. Dea 07/17 @ 9:30 am prior to EOT. Zinc , Vitamin C , Vitamin D  supplements started  -afebrile, clinically stable 9. T2DM on chronic insulin : Hgb A1C- 5.5 and well controlled (down from 6.4 earlier this year). Monitor BS ac/hs and use SSI for elevated BS.              -continue insulin  glargine 10 units and SSI was added today  D/c HS SSI, improved control over all. --consider increasing lantus --monitor for now  CBG (last 3)  Recent Labs    11/07/23 1709 11/07/23 2121 11/08/23 0548  GLUCAP 139* 139* 118*    10. Severe PAD s/p stents: On Plavix  and atorvastatin .  Monitor chronically draining area rea on right transmet site.   11. 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   -7/7 hgb down to 7.0---asymptomatic  -stools recently heme negative  -7/11 hgb stable at 7.3 (concentration effect) but believe she's  symptomatic from a stamina and exertion standpoint -she's diuresed enough where we can slowly give 1u prbc -20mg  iv lasix    12.  CAD/HFpEF w/EF  - EF 60-65%:  with hx of severe AS, mod MR -pt with another episode of sob yesterday -pulmonary edema on cxr (don't believe this is infectious) -now on iv lasix  BID -strict I's and O's -7/11 weight down to 91.8kg  -K+ supp  -renal fxn holding resonably well  -appreciate cardiology consult. Considering jardiance , spiro  -spoke with pt/mother about why she's experiencing the sob/fluid overload Filed Weights   11/06/23 0600 11/07/23 0624 11/08/23 0500  Weight: 98.3 kg 95.2 kg 91.8 kg     14.  Severe malnutrition: Albumin  level <1.5 w/ calcium  7.6-->corrects to 9.5. RD consulted  -improvement here will also help swelling.  -alb now improved to  2.0 15.  Abnormal LFTs: resolved  18. AKI: continue to monitor creatinine given increase in lasix   -7/11-- stable  LOS: 10 days A FACE TO FACE EVALUATION WAS PERFORMED  Kayla Schmitt 11/08/2023, 10:03 AM

## 2023-11-08 NOTE — Progress Notes (Signed)
  Progress Note Patient Name: Kayla Schmitt Date of Encounter: 11/08/2023 [facility] Cardiologist: Vishnu P Mallipeddi, MD   Interval Summary   Seen with her mother at the bedside and OT Hope at the bedside. Patient states she feels better on the Lasix  and is improving.  Recently received a transfusion but cannot quite tell yet if she is feeling better.  We discussed the multifactorial nature of her dyspnea.  Initial step will be continued volume improvement.  Vital Signs Vitals:   11/08/23 1111 11/08/23 1417 11/08/23 1658 11/08/23 1713  BP: (!) 109/57 (!) 110/57 125/69 121/71  Pulse: 87 91 (!) 113 (!) 117  Resp: 19 18 18 17   Temp: 98.2 F (36.8 C) 98.1 F (36.7 C) 100.2 F (37.9 C) 98.7 F (37.1 C)  TempSrc: Oral Oral  Oral  SpO2: 98% 98% 96% 96%  Weight:      Height:        Intake/Output Summary (Last 24 hours) at 11/08/2023 1736 Last data filed at 11/08/2023 1417 Gross per 24 hour  Intake 1122 ml  Output --  Net 1122 ml      11/08/2023    5:00 AM 11/07/2023    6:24 AM 11/06/2023    6:00 AM  Last 3 Weights  Weight (lbs) 202 lb 6.1 oz 209 lb 14.1 oz 216 lb 11.4 oz  Weight (kg) 91.8 kg 95.2 kg 98.3 kg      Telemetry/ECG  No telemetry available- Personally Reviewed  Physical Exam  GEN: No acute distress.   Neck: No JVD Cardiac: RRR, 3/6 systolic murmur Respiratory: Clear to auscultation bilaterally. GI: Soft, nontender, non-distended  MS: 1+ edema right lower extremity, left BKA, right transmetatarsal amputation  Assessment & Plan  Acute on chronic heart failure with preserved ejection fraction Moderate AS/moderate MR TEE on 10/24/2023 that showed LVEF of 60 to 65%, moderate MR, moderate aortic stenosis. Had worsening lower extremity edema and dyspnea on exertion that started on Friday.  Was started on oral Lasix  but continues to have lower extremity edema and dyspnea on exertion.  Elevated BNP of 336.4.   Chest x-ray found diffuse interstitial  alveolar opacities throughout the lungs that are slightly improved from prior study.  Has hypoalbuminemia that is likely contributing to third spacing.  Strict I's and O's Continue 40 mg IV Lasix  twice daily GDMT: Will consider starting Jardiance  and spironolactone pending diuresis.   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 Hemoglobin continues to remain at 7.3.  Anemia is likely contributing to shortness of breath.  She received a transfusion yesterday per chart review, transfusion goal may need to be greater than 8 given valvular heart disease and heart failure.   Elevated high-sensitivity troponin 48 Severe three-vessel CAD Hyperlipidemia These are down significantly from initial hospitalization on 10/20/2023 and 10/21/2023.  This is likely secondary to demand ischemia from acute on chronic heart failure. Previously recommended for CABG and MVR but has not had this done yet because of complicating peripheral arterial disease Continue atorvastatin  40 mg daily   PAD Continue Plavix  75 mg daily.     For questions or updates, please contact [facility] Please consult [email or web address] for contact info under       Signed, Sharda Keddy A Ameera Tigue, MD

## 2023-11-08 NOTE — Progress Notes (Signed)
 Physical Therapy Group Session Note  Patient Details  Name: Kayla Schmitt MRN: [id number] Date of Birth: [birth year]  Today's Date: 11/08/2023 PT Group Time: 1115-1200 PT Group Time Calculation (min): 45 min  Short Term Goals: Week 1:  PT Short Term Goal 1 (Week 1): pt will transfer bed<>chair with LRAD and CGA PT Short Term Goal 1 - Progress (Week 1): Met PT Short Term Goal 2 (Week 1): pt will transfer sit<>stand with LRAD and CGA PT Short Term Goal 2 - Progress (Week 1): Progressing toward goal PT Short Term Goal 3 (Week 1): pt will perform simulated car transfer with LRAD and CGA PT Short Term Goal 3 - Progress (Week 1): Met Week 2:  PT Short Term Goal 1 (Week 2): STG=LTG due to LOS  Skilled Therapeutic Interventions/Progress Updates:   Patient participated in limb loss group in social setting with emphasis on stress management and coping strategies to manage new diagnosis to allow for improved mental health to improve overall quality of life. Provided active listening, emotional support and therapeutic use of self. Education provided on prosthetic fitting timeline as well as pain management strategies including desensitization techniques. Issued and reviewed handouts on balanced amputee wellness and educational communities, amputee support group of the triad, contracture prevention, limb protector, and shrinker wear/care guidelines. Also discussed activity modification in relation to patient's goals and hobbies. Patient actively engaged throughout and appreciative of group session.  Therapy Documentation Precautions:  Precautions Precautions: Fall Precaution/Restrictions Comments: L BKA with wound vac Restrictions Weight Bearing Restrictions Per Provider Order: Yes LLE Weight Bearing Per Provider Order: Non weight bearing  Therapy/Group: Group Therapy Therisa HERO Zaunegger Gurkaran Rahm Zaunegger PT, DPT 11/08/2023, 7:13 AM

## 2023-11-08 NOTE — Group Note (Signed)
 Patient Details Name: Kayla Schmitt MRN: [id number] DOB: 09/15/1965 Today's Date: 11/08/2023  Group Description: Amputee: Patient participated in limb loss group in social setting with emphasis on stress management and coping strategies to manage new diagnosis to allow for improved mental health to improve overall quality of life. Provided active listening, emotional support and therapeutic use of self. Education provided on prosthetic fitting timeline as well as pain management strategies including desensitization techniques and mirror therapy. Issued and reviewed handouts on balanced amputee wellness and educational communities, amputee support group of the triad, contracture prevention, limb protector, and shrinker wear/care guidelines. Also discussed activity modification in relation to patient's goals and hobbies. Patient actively engaged throughout and appreciative of group session.  Individual level documentation: Pt transported back to room & left with all needs within reach  Pain:no c/o Pain Assessment Pain Scale: 0-10 Pain Score: 0-No pain  Hanni Milford 11/08/2023, 12:20 PM

## 2023-11-08 NOTE — Progress Notes (Signed)
 VASCULAR LAB    Right lower extremity venous duplex has been performed.  See CV proc for preliminary results.   Jahlon Baines, RVT 11/08/2023, 2:11 PM

## 2023-11-09 LAB — CBC
HCT: 25.3 % — ABNORMAL LOW (ref 36.0–46.0)
Hemoglobin: 8 g/dL — ABNORMAL LOW (ref 12.0–15.0)
MCH: 28.5 pg (ref 26.0–34.0)
MCHC: 31.6 g/dL (ref 30.0–36.0)
MCV: 90 fL (ref 80.0–100.0)
Platelets: 318 K/uL (ref 150–400)
RBC: 2.81 MIL/uL — ABNORMAL LOW (ref 3.87–5.11)
RDW: 18.9 % — ABNORMAL HIGH (ref 11.5–15.5)
WBC: 8.6 K/uL (ref 4.0–10.5)
nRBC: 0 % (ref 0.0–0.2)

## 2023-11-09 LAB — TYPE AND SCREEN
ABO/RH(D): O POS
Antibody Screen: NEGATIVE
Unit division: 0

## 2023-11-09 LAB — GLUCOSE, CAPILLARY
Glucose-Capillary: 114 mg/dL — ABNORMAL HIGH (ref 70–99)
Glucose-Capillary: 138 mg/dL — ABNORMAL HIGH (ref 70–99)
Glucose-Capillary: 154 mg/dL — ABNORMAL HIGH (ref 70–99)
Glucose-Capillary: 132 mg/dL — ABNORMAL HIGH (ref 70–99)

## 2023-11-09 LAB — BPAM RBC
Blood Product Expiration Date: 202507152359
ISSUE DATE / TIME: 202507111046
Unit Type and Rh: 9500

## 2023-11-09 LAB — BASIC METABOLIC PANEL WITH GFR
Anion gap: 11 (ref 5–15)
BUN: 35 mg/dL — ABNORMAL HIGH (ref 6–20)
CO2: 27 mmol/L (ref 22–32)
Calcium: 8.6 mg/dL — ABNORMAL LOW (ref 8.9–10.3)
Chloride: 101 mmol/L (ref 98–111)
Creatinine, Ser: 1.27 mg/dL — ABNORMAL HIGH (ref 0.44–1.00)
GFR, Estimated: 50 mL/min — ABNORMAL LOW (ref >60)
Glucose, Bld: 117 mg/dL — ABNORMAL HIGH (ref 70–99)
Potassium: 3.9 mmol/L (ref 3.5–5.1)
Sodium: 139 mmol/L (ref 135–145)

## 2023-11-09 MED ORDER — IRON SUCROSE 200 MG IVPB - SIMPLE MED
200.0000 mg | Freq: Once | Status: AC
  Administered 2023-11-09: 200 mg via INTRAVENOUS
  Filled 2023-11-09: qty 200

## 2023-11-09 NOTE — Progress Notes (Signed)
 PROGRESS NOTE   Subjective/Complaints:  Pt reports still having drainage- removed VAC 2 days ago- but drainage is scant per pt and OT.  Happy that Hb up to 8.0- after transfusion yesterday.  Started to pee a LOT at 5am this AM, but not before then- was small amounts til early this AM.  Mother is upset that she's having trouble breathing 5x in last 6 days.   Per Cards note, pt really needs a CABG and MVR, but have to hold off until doing better from L BKA.    ROS:   Pt denies SOB, abd pain, CP, N/V/C/D, and vision changes- still feels weak- wants to get more blood- IV iron - I agree with IV iron - since just got transfusion yesterday, don't see can transfuse again today.    Objective:   VAS US  LOWER EXTREMITY VENOUS (DVT) Result Date: 11/08/2023  Lower Venous DVT Study Patient Name:  Kayla Schmitt [facility]  Date of Exam:   11/08/2023 Medical Rec #: [id number]               Accession #:    [id number] Date of Birth: 1966-03-02                Patient Gender: F Patient Age:   58 years Exam Location:  [facility] Procedure:      VAS US  LOWER EXTREMITY VENOUS (DVT) Referring Phys: PAMELA LOVE --------------------------------------------------------------------------------  Indications: Edema.  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 Limitations: Poor ultrasound/tissue interface and Edema, acoustic shadowing secondary to plaque. Comparison Study: Prior negative right LEV done 11/03/23 Performing Technologist: Alberta Lis RVS  Examination Guidelines: A complete evaluation includes B-mode imaging, spectral Doppler, color Doppler, and power Doppler as needed of all accessible portions of each vessel. Bilateral testing is considered an integral part of a complete examination. Limited examinations for reoccurring indications may be performed as noted. The reflux portion of the exam is performed with the  patient in reverse Trendelenburg.  +---------+---------------+---------+-----------+----------+-------------------+ RIGHT    CompressibilityPhasicitySpontaneityPropertiesThrombus Aging      +---------+---------------+---------+-----------+----------+-------------------+ CFV      Full           Yes      Yes                                      +---------+---------------+---------+-----------+----------+-------------------+ SFJ      Full           Yes      Yes                                      +---------+---------------+---------+-----------+----------+-------------------+ FV Prox  Full                                                             +---------+---------------+---------+-----------+----------+-------------------+  FV Mid   Full           Yes      Yes                                      +---------+---------------+---------+-----------+----------+-------------------+ FV DistalFull           Yes      Yes                                      +---------+---------------+---------+-----------+----------+-------------------+ PFV      Full                                                             +---------+---------------+---------+-----------+----------+-------------------+ POP      Full           Yes      Yes                                      +---------+---------------+---------+-----------+----------+-------------------+ PTV      Full                                                             +---------+---------------+---------+-----------+----------+-------------------+ PERO                                                  Not well visualized +---------+---------------+---------+-----------+----------+-------------------+ Gastroc  Full                                                             +---------+---------------+---------+-----------+----------+-------------------+ SSV      Full                                                              +---------+---------------+---------+-----------+----------+-------------------+     Summary: RIGHT: - Findings appear essentially unchanged compared to previous examination. - There is no evidence of deep vein thrombosis in the lower extremity. However, portions of this examination were limited- see technologist comments above.  - No cystic structure found in the popliteal fossa. - Ultrasound characteristics of enlarged lymph nodes are noted in the groin.  LEFT: - No evidence of common femoral vein obstruction.  - Ultrasound characteristics of enlarged lymph nodes noted in the groin.  *See table(s) above for measurements and observations. Electronically signed by Gaile New MD on  11/08/2023 at 5:18:03 PM.    Final    DG Chest 2 View Result Date: 11/07/2023 CLINICAL DATA:  [id number] Dyspnea on exertion [id number] EXAM: CHEST - 2 VIEW COMPARISON:  11/05/2023. FINDINGS: Redemonstration of diffuse alveolar and interstitial opacities throughout bilateral lungs, overall slightly improved since the prior study. Findings are nonspecific and differential diagnosis includes pulmonary edema, ARDS or multilobar pneumonia. There is subtle blunting of bilateral posterior costophrenic angles, favoring bilateral trace pleural effusions. No pneumothorax. Stable cardio-mediastinal silhouette. No acute osseous abnormalities. The soft tissues are within normal limits. IMPRESSION: *Redemonstration of diffuse alveolar and interstitial opacities throughout bilateral lungs, overall slightly improved since the prior study. Findings are nonspecific and differential diagnosis includes pulmonary edema, ARDS or multilobar pneumonia. Electronically Signed   By: Ree Molt M.D.   On: 11/07/2023 17:28    Recent Labs    11/08/23 0410 11/09/23 0453  WBC 7.2 8.6  HGB 7.3* 8.0*  HCT 23.4* 25.3*  PLT 301 318   Recent Labs    11/08/23 0410 11/09/23 0453  NA 139 139  K 3.9 3.9  CL 102 101  CO2 28 27   GLUCOSE 132* 117*  BUN 31* 35*  CREATININE 1.14* 1.27*  CALCIUM  8.6* 8.6*     Intake/Output Summary (Last 24 hours) at 11/09/2023 0828 Last data filed at 11/09/2023 0741 Gross per 24 hour  Intake 1362 ml  Output --  Net 1362 ml        Physical Exam: Vital Signs Blood pressure 101/68, pulse (!) 102, temperature 100.1 F (37.8 C), temperature source Oral, resp. rate 18, height 5' 4 (1.626 m), weight 91 kg, SpO2 96%.    General: awake, alert, appropriate, sitting EOB with OT- mother in room; NAD HENT: conjugate gaze; oropharynx moist CV: regular rhythm- mildly tachycardic; no JVD Pulmonary: improved air movement- no W/R/R today GI: soft, NT, ND, (+)BS Psychiatric: appropriate- interactive; a little anxious about d/c Neurological: Ox3  Ext: no clubbing, cyanosis, 1+ RLE edema- about the same- L BKA- incision looks great- no drainage seen on pt- but has on cover on w/c. Psych: pleasant and cooperative  Musculoskeletal:     Cervical back: Neck supple. No tenderness. Left BK swollen, tender. Wearing shrinker     Skin:    General: Skin is warm and dry.     Comments: Left BK incision well approximated with staples. Sl serosanguinous drainage from corners.    Neurological:     Mental Status: She is alert and oriented to person, place, and time.     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 Prior neuro assessment is c/w 11/09/2023 exam.      Assessment/Plan: 1. Functional deficits which require 3+ hours per day of interdisciplinary therapy in a comprehensive inpatient rehab setting. Physiatrist is providing close team supervision and 24 hour management of active medical problems listed below. Physiatrist and rehab team continue to assess barriers to discharge/monitor patient progress toward functional and medical goals  Care  Tool:  Bathing    Body parts bathed by patient: Right arm, Left arm, Chest, Abdomen, Left upper leg, Face, Front perineal area, Buttocks, Right upper leg, Right lower leg   Body parts bathed by helper: Right lower leg, Front perineal area, Buttocks Body parts n/a: Left lower leg   Bathing assist Assist Level: Set up assist  Upper Body Dressing/Undressing Upper body dressing   What is the patient wearing?: Pull over shirt    Upper body assist Assist Level: Independent with assistive device    Lower Body Dressing/Undressing Lower body dressing      What is the patient wearing?: Underwear/pull up     Lower body assist Assist for lower body dressing: Minimal Assistance - Patient > 75%     Toileting Toileting    Toileting assist Assist for toileting: Minimal Assistance - Patient > 75%     Transfers Chair/bed transfer  Transfers assist     Chair/bed transfer assist level: Supervision/Verbal cueing     Locomotion Ambulation   Ambulation assist   Ambulation activity did not occur: Safety/medical concerns (pain, decreased balance, wound on R heel)          Walk 10 feet activity   Assist  Walk 10 feet activity did not occur: Safety/medical concerns (pain, decreased balance, wound on R heel)        Walk 50 feet activity   Assist Walk 50 feet with 2 turns activity did not occur: Safety/medical concerns (pain, decreased balance, wound on R heel)         Walk 150 feet activity   Assist Walk 150 feet activity did not occur: Safety/medical concerns (pain, decreased balance, wound on R heel)         Walk 10 feet on uneven surface  activity   Assist Walk 10 feet on uneven surfaces activity did not occur: Safety/medical concerns (pain, decreased balance, wound on R heel)         Wheelchair     Assist Is the patient using a wheelchair?: Yes Type of Wheelchair: Manual    Wheelchair assist level: Independent Max wheelchair distance:  75    Wheelchair 50 feet with 2 turns activity    Assist        Assist Level: Independent   Wheelchair 150 feet activity     Assist      Assist Level: Independent   Blood pressure 101/68, pulse (!) 102, temperature 100.1 F (37.8 C), temperature source Oral, resp. rate 18, height 5' 4 (1.626 m), weight 91 kg, SpO2 96%.   Medical Problem List and Plan: 1. 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  supervision goals         Co't CIR PT and OT  Will order smaller shrinkers 3XL x2  Education on that pt's body has decompensated due to amputation, which has decompensated may of her body systems.   Pt seen twice- 2nd time in gym with mother  Since receiving IV Lasix , I think it's unlikely she's leaving Monday, but will reassess in AM 2.  Antithrombotics: -DVT/anticoagulation:  Pharmaceutical: continue Lovenox              -antiplatelet therapy: continue plavix .   3. Pain Management: continue Oxycodone  prn.   -on gabapentin  200mg  tid for phantom pain, RLS  -pain seems controlled 4. Mood/Behavior/Sleep: LCSW to follow for evaluation and support.               -antipsychotic agents: n/a 5. Neuropsych/cognition: This patient is capable of making decisions on her own behalf. 6. Skin/Wound Care: Pressure relief measures. Protein supplements added. Monitor for recurrent  drainage.  -vac off             --will place dry dressing over corner of incision until drainage ceases  -continue shrinker. Pt can don/doff herself  7/12- will order smaller shrinkers 3XL per therapy request 7. Fluids/Electrolytes/Nutrition: Monitor I/O.   8.  MRSA bacteremia: Last CK 522-->continue Daptomycin  with EOT  11/17/23             --weekly CRP, Sed rate, CPK in addition to CBC/BMP.             --follow up with Dr. Dea 07/17 @ 9:30 am prior to EOT. Zinc ,  Vitamin C , Vitamin D  supplements started  -afebrile, clinically stable 9. T2DM on chronic insulin : Hgb A1C- 5.5 and well controlled (down from 6.4 earlier this year). Monitor BS ac/hs and use SSI for elevated BS.              -continue insulin  glargine 10 units and SSI was added today  D/c HS SSI, improved control over all. --consider increasing lantus --monitor for now  7/12- CBG's looking good- con't regimen  CBG (last 3)  Recent Labs    11/08/23 1643 11/08/23 2123 11/09/23 0638  GLUCAP 175* 149* 114*    10. Severe PAD s/p stents: On Plavix  and atorvastatin .  Monitor chronically draining area rea on right transmet site.   11. 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   -7/7 hgb down to 7.0---asymptomatic  -stools recently heme negative  -7/11 hgb stable at 7.3 (concentration effect) but believe she's symptomatic from a stamina and exertion standpoint -she's diuresed enough where we can slowly give 1u prbc -20mg  iv lasix   7/12- will give IV iron  dosing, since Hb up to 8.0- but pt still symptomatic- with tachycardic- yellow MEWS and intermittently low O2 sats 12.  CAD/HFpEF w/EF  - EF 60-65%:  with hx of severe AS, mod MR -pt with another episode of sob yesterday -pulmonary edema on cxr (don't believe this is infectious) -now on iv lasix  BID -strict I's and O's -7/11 weight down to 91.8kg  -K+ supp  -renal fxn holding resonably well  -appreciate cardiology consult. Considering jardiance , spiro  -spoke with pt/mother about why she's experiencing the sob/fluid overload 7/12- Weight down to 91 kg- even after blood- got IV Lasix  multiple times yesterday- finally started peeing a lot at 5am this morning.  Educated pt that Cards feels she needs CABG/MVR sooner than later to help SOB/pulmonary edema.  Filed Weights   11/07/23 0624 11/08/23 0500 11/09/23 0500  Weight: 95.2 kg 91.8 kg 91 kg     14.  Severe malnutrition: Albumin  level <1.5 w/ calcium  7.6-->corrects to  9.5. RD consulted  -improvement here will also help swelling.  -alb now improved to  2.0 15.  Abnormal LFTs: resolved  18. AKI: continue to monitor creatinine given increase in lasix   -7/11-- stable  7/12- Cr up to 1.27- will check in AM- and reduce Lasix  slightly if if continues to climb-  I spent a total of 57    minutes on total care today- >50% coordination of care- due to  D/w pt x2- review of chart in depth to discuss medical issues with pt- and also documentation and d/w nursing  LOS: 11 days A FACE TO FACE EVALUATION WAS PERFORMED  Kayla Schmitt 11/09/2023, 8:28 AM

## 2023-11-09 NOTE — Progress Notes (Signed)
  Progress Note Patient Name: Kayla Schmitt Date of Encounter: 11/09/2023 [facility] Cardiologist: Vishnu P Mallipeddi, MD   Interval Summary   Seen with her mother at the bedside. Patient states she feels better on the Lasix  and is improving but remains winded and short of breath, different symptoms.  We discussed the multifactorial nature of her dyspnea.  Initial step will be continued volume improvement.  Vital Signs Vitals:   11/09/23 0022 11/09/23 0358 11/09/23 0500 11/09/23 0738  BP: 123/71 114/65  101/68  Pulse: 94 99  (!) 102  Resp: 17 18  18   Temp: 98.9 F (37.2 C) 98.4 F (36.9 C)  100.1 F (37.8 C)  TempSrc: Oral Oral  Oral  SpO2: 99% 99%  96%  Weight:   91 kg   Height:        Intake/Output Summary (Last 24 hours) at 11/09/2023 1217 Last data filed at 11/09/2023 0741 Gross per 24 hour  Intake 1362 ml  Output --  Net 1362 ml      11/09/2023    5:00 AM 11/08/2023    5:00 AM 11/07/2023    6:24 AM  Last 3 Weights  Weight (lbs) 200 lb 9.9 oz 202 lb 6.1 oz 209 lb 14.1 oz  Weight (kg) 91 kg 91.8 kg 95.2 kg      Telemetry/ECG  No telemetry available- Personally Reviewed  Physical Exam  GEN: No acute distress.   Neck: No JVD Cardiac: RRR, 3/6 systolic murmur Respiratory: Clear to auscultation bilaterally. GI: Soft, nontender, non-distended  MS: 1+ edema right lower extremity, left BKA, right transmetatarsal amputation  Assessment & Plan  Acute on chronic heart failure with preserved ejection fraction Moderate AS/moderate MR TEE on 10/24/2023 that showed LVEF of 60 to 65%, moderate MR, moderate aortic stenosis. Had worsening lower extremity edema and dyspnea on exertion that started on Friday.  Was started on oral Lasix  but continues to have lower extremity edema and dyspnea on exertion.  Elevated BNP of 336.4.   Chest x-ray found diffuse interstitial alveolar opacities throughout the lungs that are slightly improved from prior study.  Has  hypoalbuminemia that is likely contributing to third spacing. Strict I's and O's Continue 40 mg IV Lasix  twice daily today, and monitor renal function which is mildly abnormal today. Reports good diuresis.  GDMT: Will consider starting Jardiance  and spironolactone pending diuresis.   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 Hemoglobin 8. Pending iron  infusion per primary team, agree.  Anemia is likely contributing to shortness of breath.  She received a transfusion 7/10 per chart review, transfusion goal may need to be greater than 8 given valvular heart disease and heart failure.   Elevated high-sensitivity troponin 48 Severe three-vessel CAD Hyperlipidemia These are down significantly from initial hospitalization on 10/20/2023 and 10/21/2023.  This is likely secondary to demand ischemia from acute on chronic heart failure. Previously recommended for CABG and MVR but has not had this done yet because of complicating peripheral arterial disease Continue atorvastatin  40 mg daily   PAD Continue Plavix  75 mg daily.     For questions or updates, please contact [facility] Please consult [email or web address] for contact info under       Signed, Sahalie Beth A Titianna Loomis, MD

## 2023-11-09 NOTE — Progress Notes (Signed)
 Occupational Therapy Session Note  Patient Details  Name: Kayla Schmitt MRN: [id number] Date of Birth: 1966/04/14  Today's Date: 11/09/2023 OT Individual Time: 8948-8795 OT Individual Time Calculation (min): 73 min    Short Term Goals: Week 2:  OT Short Term Goal 1 (Week 2): STG = LTG due to ELOS  Skilled Therapeutic Interventions/Progress Updates: Patient received OOB getting off the [facility] with assist from her mother. Patient motivated to participate with scheduled treatment even though she continues to report SOB. Patient wanting to perform ADL's at the sink. Patient performed sponge bath with set up assist and set up assist to don new shirt. Patient able to get set up for self care using the w/c without assist and displayed good safety locking breaks. Continued treatment session with UE therEx. Starting with BUE active breathing exercises working to incorporate UE movements with diaphragm breathing. Patient with good return demo of movements but needed cues to become aware when reverting into a scalene breathing pattern. Patient tolerating active breathing motions including forward flexion and trunk rotation without report of increased SOB. Continued treatment session with UE endurance exercises using yellow theraBand for 2 x 10 reps in varied planes. Patient reports having utilized bands at home for maintaining strength for the past 3 years and feels good about continuing her HEP upon discharge. Continue with skilled OT POC.     Therapy Documentation Precautions:  Precautions Precautions: Fall Precaution/Restrictions Comments: R transmet amputation Restrictions Weight Bearing Restrictions Per Provider Order: Yes LLE Weight Bearing Per Provider Order: Non weight bearing    Pain: Pain Assessment Pain Scale: 0-10 Pain Score: 0-No pain    Therapy/Group: Individual Therapy  Kayla Schmitt 11/09/2023, 12:32 PM

## 2023-11-09 NOTE — Progress Notes (Signed)
 Physical Therapy Session Note  Patient Details  Name: Kayla Schmitt MRN: [id number] Date of Birth: 1965-11-19  Today's Date: 11/09/2023 PT Individual Time: 9271-9158 and 1245-1316 PT Individual Time Calculation (min): 73 min and 31 min PT Missed Time: 29 minutes PT Missed Time Reason: nausea  Short Term Goals: Week 1:  PT Short Term Goal 1 (Week 1): pt will transfer bed<>chair with LRAD and CGA PT Short Term Goal 1 - Progress (Week 1): Met PT Short Term Goal 2 (Week 1): pt will transfer sit<>stand with LRAD and CGA PT Short Term Goal 2 - Progress (Week 1): Progressing toward goal PT Short Term Goal 3 (Week 1): pt will perform simulated car transfer with LRAD and CGA PT Short Term Goal 3 - Progress (Week 1): Met Week 2:  PT Short Term Goal 1 (Week 2): STG=LTG due to LOS  Skilled Therapeutic Interventions/Progress Updates:   Treatment Session 1 Received pt semi-reclined in bed with mother at bedside. Pt agreeable to PT treatment and did not state pain level (declined pain medication) - limited by SOB with exertion during session. Session with emphasis on discharge planning, functional mobility/transfers, generalized strengthening and endurance, dynamic standing balance/coordination. Provided pt with 2024 First Step beginning your new journey booklet from amputee coalition as well as information regarding driving and use of hand controls from amputee coalition. NT present to check vitals and pt changed shrinkers mod I - incision healing very well and minimal drainage noted from lateral aspect of limb. Donned clean 4XL shrinker, then went through sensation, MMT, and pain interference questionnaire in preparation for discharge. MD arrived for morning rounds (requested order for 3XL shrinkers) and discussed SOB. Pt stood from EOB with RW and supervision and able to pick up object from floor using reacher and close supervision.  IV team present to flush line, then pt transferred into [facility] via  lateral scoot with supervision with assist to stabilize WC - pt with mild SOB after transferring. Pt performed WC mobility 161ft using BUE and mod I with emphasis on UE strength and coordination to main therapy gym. Pt able to set up all transfers with mod I and transferred to/from mat via lateral scoot with supervision/mod I with assist from mother to stabilize WC. Transferred into supine on wedge with mod I and performed the following exercises with emphasis on LE strength, ROM, and contracture prevention: -SLR 2x10 bilaterally -hip abduction 2x10 bilaterally -bridges on physioball 2x10  -shoulder extensions into physioball 2x10 with 10 second hold - emphasis on breath control -R sidelying L hip abduction 2x10 -R sidelying L hip extension 2x10 Pt needed to sit EOM a few times during exercises to breathe easier - transferred R sidelying<>sitting EOM mod I. Worked on blocked practice sit<>stands from EOM with RW x 5 reps with supervision - pt with good recall of hand placement when standing but required cues to reach back prior to sitting.Transported back to room in [facility] dependently concluded session with pt sitting in [facility] with all needs within reach.   Treatment Session 2 Received pt sitting in [facility] with mother at bedside. Pt reporting increased nausea this afternoon, reporting I just don't feel well, all I want to do is lie down - of note, this is abnormal for her as she remains motivated to participate in therapy no matter what. Reported urge to void and transferred onto commode via lateral scoot with supervision and assist to stabilize WC. Pt able to remove clothing via lateral leans and required increased time  to complete voiding. Nutrition services arrived to order meals and therapist provided pt with emesis bag. Pt transferred bedside commode<>bed via lateral scoot with supervision and transferred into supine mod I. RN arrived to administer IV antibiotics, iron , and nausea medication. Concluded session  with pt semi-reclined in bed with all needs within reach and mother at bedside. 29 minutes missed of skilled physical therapy due to nausea.   Therapy Documentation Precautions:  Precautions Precautions: Fall Precaution/Restrictions Comments: L BKA with wound vac Restrictions Weight Bearing Restrictions Per Provider Order: Yes LLE Weight Bearing Per Provider Order: Non weight bearing  Therapy/Group: Individual Therapy Therisa HERO Zaunegger Therisa Stains PT, DPT 11/09/2023, 6:56 AM

## 2023-11-09 NOTE — Plan of Care (Signed)
  Problem: RH Ambulation Goal: LTG Patient will ambulate in home environment (PT) Description: LTG: Patient will ambulate in home environment, # of feet with assistance (PT). Outcome: Not Applicable Flowsheets (Taken 11/09/2023 0706) LTG: Pt will ambulate in home environ  assist needed:: (D/C) -- Note: D/C

## 2023-11-09 NOTE — Progress Notes (Shared)
 Physical Therapy Discharge Summary  Patient Details  Name: Kayla Schmitt MRN: [id number] Date of Birth: December 24, 1965  Date of Discharge from PT service:November 10, 2023  Patient has met 6 of 7 long term goals due to improved activity tolerance, improved balance, improved postural control, increased strength, increased range of motion, decreased pain, ability to compensate for deficits, improved awareness, and improved coordination.  Patient to discharge at a wheelchair level Modified Independent. Patient's care partner is independent to provide the necessary physical assistance at discharge. [name] has participated in hands on family education training and verbalized and demonstrated confidence with all tasks to ensure safe discharge home. [name] was also cleared to assist with transfers during rehab stay.   Reasons goals not met: pt did not meet dynamic standing balance goal of mod I as pt currently requires CGA for dynamic standing balance due to decreased balance/coordination and decreased stability of R ankle from transmet amputation.   Recommendation:  Patient will benefit from ongoing skilled PT services in outpatient setting to continue to advance safe functional mobility, address ongoing impairments in transfers, generalized strengthening and endurance, dynamic standing balance/coordination, gait training, limb loss education, and to minimize fall risk.  Equipment: RW - already has 2 wheelchairs  Reasons for discharge: treatment goals met  Patient/family agrees with progress made and goals achieved: Yes  PT Discharge Precautions/Restrictions Precautions Precautions: Fall Precaution/Restrictions Comments: R transmet amputation Restrictions Weight Bearing Restrictions Per Provider Order: Yes LLE Weight Bearing Per Provider Order: Non weight bearing Pain Interference Pain Interference Pain Effect on Sleep: 1. Rarely or not at all Pain Interference with Therapy  Activities: 1. Rarely or not at all Pain Interference with Day-to-Day Activities: 1. 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, weakness/deconditioning, and decreased balance/coordination Heel Shin Test: unable to perform on LLE due to BKA Motor  Motor Motor: Abnormal postural alignment and control Motor - Skilled Clinical Observations: altered balance strategies due to L BKA and R transmet amputation with poor ankle stability, weakness/deconditioning, and decreased balance/coordination  Mobility Bed Mobility Bed Mobility: Rolling Right;Rolling Left;Sit to Supine;Supine to Sit Rolling Right: Independent Rolling Left: Independent Supine to Sit: Independent Sit to Supine: Independent Transfers Transfers: Sit to Stand;Stand to Sit;Lateral/Scoot Transfers Sit to Stand: Independent with assistive device Stand to Sit: Independent with assistive device Lateral/Scoot Transfers: Independent with assistive device Transfer (Assistive device): Rolling walker Locomotion  Gait Ambulation: No Gait Gait: No Stairs / Additional Locomotion Stairs: No Corporate treasurer: Yes Wheelchair Assistance: Independent with Information systems manager: Both upper extremities Wheelchair Parts Management: Needs assistance Distance: 175ft  Trunk/Postural Assessment  Cervical Assessment Cervical Assessment: Within Functional Limits Thoracic Assessment Thoracic Assessment: Within Functional Limits Lumbar Assessment Lumbar Assessment: Exceptions to [facility] (posterior pelvic tilt) Postural Control Postural Control: Deficits on evaluation Righting Reactions: delayed on L Protective Responses: delayed on L  Balance Balance Balance Assessed: Yes Static Sitting Balance Static Sitting - Balance  Support: Feet supported;Bilateral upper extremity supported Static Sitting - Level of Assistance: 7: Independent Dynamic Sitting Balance Dynamic Sitting - Balance Support: Feet supported;No upper extremity supported Dynamic Sitting - Level of Assistance: 6: Modified independent (Device/Increase time) Static Standing Balance Static Standing - Balance Support: Bilateral upper extremity supported;During functional activity (RW) Static Standing - Level of Assistance: 6: Modified independent (Device/Increase time) Dynamic Standing Balance Dynamic Standing - Balance Support: Bilateral upper extremity supported;During functional activity (RW) Dynamic Standing - Level of Assistance: 5: Stand by assistance (CGA) Dynamic Standing - Comments: with prolonged standing. Unable to hop due to R ankle instability Extremity Assessment  RLE Assessment RLE Assessment: Exceptions to [facility] Strength Comments: tested sitting EOB RLE Strength Right Hip Flexion: 4/5 Right Hip ABduction: 4/5 Right Hip ADduction: 4/5 Right Knee Flexion: 4/5 Right Knee Extension: 4/5 LLE Assessment LLE Assessment: Exceptions to [facility] General Strength Comments: tested sitting EOB LLE Strength Left Hip Flexion: 4-/5 Left Hip ABduction: 4-/5 Left Hip ADduction: 4-/5 Left Knee Flexion: 4-/5 Left Knee Extension: 4-/5   Therisa HERO Zaunegger Therisa Stains  PT, DPT 11/09/2023, 7:05 AM

## 2023-11-09 NOTE — Progress Notes (Signed)
 Orthopedic Tech Progress Note Patient Details:  [name] March 27, 1966 [id number] Two 3XL BK shrinkers have been ordered from [facility]  Patient ID: Kayla Schmitt, female   DOB: Jan 19, 1966, 58 y.o.   MRN: [id number]  Massie FORBES Bar 11/09/2023, 8:52 AM

## 2023-11-10 LAB — CBC WITH DIFFERENTIAL/PLATELET
Abs Immature Granulocytes: 0.06 K/uL (ref 0.00–0.07)
Basophils Absolute: 0 K/uL (ref 0.0–0.1)
Basophils Relative: 0 %
Eosinophils Absolute: 1.1 K/uL — ABNORMAL HIGH (ref 0.0–0.5)
Eosinophils Relative: 10 %
HCT: 26.4 % — ABNORMAL LOW (ref 36.0–46.0)
Hemoglobin: 8.2 g/dL — ABNORMAL LOW (ref 12.0–15.0)
Immature Granulocytes: 1 %
Lymphocytes Relative: 12 %
Lymphs Abs: 1.2 K/uL (ref 0.7–4.0)
MCH: 28.1 pg (ref 26.0–34.0)
MCHC: 31.1 g/dL (ref 30.0–36.0)
MCV: 90.4 fL (ref 80.0–100.0)
Monocytes Absolute: 1.1 K/uL — ABNORMAL HIGH (ref 0.1–1.0)
Monocytes Relative: 11 %
Neutro Abs: 6.8 K/uL (ref 1.7–7.7)
Neutrophils Relative %: 66 %
Platelets: 386 K/uL (ref 150–400)
RBC: 2.92 MIL/uL — ABNORMAL LOW (ref 3.87–5.11)
RDW: 18.3 % — ABNORMAL HIGH (ref 11.5–15.5)
WBC: 10.3 K/uL (ref 4.0–10.5)
nRBC: 0 % (ref 0.0–0.2)

## 2023-11-10 LAB — GLUCOSE, CAPILLARY
Glucose-Capillary: 138 mg/dL — ABNORMAL HIGH (ref 70–99)
Glucose-Capillary: 158 mg/dL — ABNORMAL HIGH (ref 70–99)
Glucose-Capillary: 136 mg/dL — ABNORMAL HIGH (ref 70–99)
Glucose-Capillary: 178 mg/dL — ABNORMAL HIGH (ref 70–99)

## 2023-11-10 LAB — BASIC METABOLIC PANEL WITH GFR
Anion gap: 10 (ref 5–15)
BUN: 32 mg/dL — ABNORMAL HIGH (ref 6–20)
CO2: 26 mmol/L (ref 22–32)
Calcium: 8.6 mg/dL — ABNORMAL LOW (ref 8.9–10.3)
Chloride: 100 mmol/L (ref 98–111)
Creatinine, Ser: 1.33 mg/dL — ABNORMAL HIGH (ref 0.44–1.00)
GFR, Estimated: 47 mL/min — ABNORMAL LOW (ref >60)
Glucose, Bld: 149 mg/dL — ABNORMAL HIGH (ref 70–99)
Potassium: 4 mmol/L (ref 3.5–5.1)
Sodium: 136 mmol/L (ref 135–145)

## 2023-11-10 MED ORDER — POLYETHYLENE GLYCOL 3350 17 G PO PACK: 17.0000 g | PACK | Freq: Every day | ORAL | Status: DC | PRN

## 2023-11-10 NOTE — Progress Notes (Signed)
 Occupational Therapy Session Note  Patient Details  Name: Kayla Schmitt MRN: [id number] Date of Birth: [birth year]  Today's Date: 11/10/2023 OT Individual Time: 9184-9074 OT Individual Time Calculation (min): 70 min    Short Term Goals: Week 2:  OT Short Term Goal 1 (Week 2): STG = LTG due to ELOS  Skilled Therapeutic Interventions/Progress Updates:    Pt sitting on BSC upon arrival, pants already pulled over hips. Scoot transfer to w/c with CGA to steady equipment. Pt reports she is still getting SOB and winded. Pt reports some increased anxiety in relation to this. OT intervention with focus on energy conservation strategies and breathing strategies to relieve anxiety. Pt propelled w/c in hallways with rest break and OTA assisting when fatigued/SOB. BUE therex on UBE level 1 5x2 min with focus on breathing strategies/techniques. Therapeutic listening throughout session and emotional support provided. Discussed setting up schedule at home to space out activities for energy conservation. Pt frustrated with change in medical status re: SOB. Discharge delayed one (1) day per MD. Pt remained in w/c with all needs within reach.   Therapy Documentation Precautions:  Precautions Precautions: Fall Precaution/Restrictions Comments: R transmet amputation Restrictions Weight Bearing Restrictions Per Provider Order: Yes LLE Weight Bearing Per Provider Order: Non weight bearing   Pain: Pt denies pain this morning  Therapy/Group: Individual Therapy  Maritza Debby Mare 11/10/2023, 9:27 AM

## 2023-11-10 NOTE — Progress Notes (Signed)
 Physical Therapy Session Note  Patient Details  Name: Kayla Schmitt MRN: [id number] Date of Birth: [birth year]  Today's Date: 11/10/2023 PT Individual Time: 8640-8557 PT Individual Time Calculation (min): 43 min   Short Term Goals: Week 1:  PT Short Term Goal 1 (Week 1): pt will transfer bed<>chair with LRAD and CGA PT Short Term Goal 1 - Progress (Week 1): Met PT Short Term Goal 2 (Week 1): pt will transfer sit<>stand with LRAD and CGA PT Short Term Goal 2 - Progress (Week 1): Progressing toward goal PT Short Term Goal 3 (Week 1): pt will perform simulated car transfer with LRAD and CGA PT Short Term Goal 3 - Progress (Week 1): Met  Skilled Therapeutic Interventions/Progress Updates:  Pt was seen bedside in the pm. Pt propelled w/c to and from gym about 150 feet Independently. In gym treatment focused on LE strengthening: B hip flex and LAQs and L quad sets, 3 sets x 10 reps each. Pt returned to room following treatment and left sitting up in w/c with all needs within reach.   Therapy Documentation Precautions:  Precautions Precautions: Fall Precaution/Restrictions Comments: R transmet amputation Restrictions Weight Bearing Restrictions Per Provider Order: Yes LLE Weight Bearing Per Provider Order: Non weight bearing General:   Pain: No c/o pain  Therapy/Group: Individual Therapy  Cabe Lashley G 11/10/2023, 3:36 PM

## 2023-11-10 NOTE — Progress Notes (Addendum)
  Progress Note Patient Name: Kayla Schmitt Date of Encounter: 11/10/2023 [facility] Cardiologist: Vishnu P Mallipeddi, MD   Interval Summary   Seen with her mother at the bedside. Feels better overall, appears brighter.   Vital Signs Vitals:   11/09/23 1318 11/09/23 2013 11/10/23 0500 11/10/23 0511  BP: 131/69 111/69  102/63  Pulse: (!) 106 99  100  Resp: 18 19    Temp: 98 F (36.7 C) 98.7 F (37.1 C)  98.4 F (36.9 C)  TempSrc: Oral Oral  Oral  SpO2: 95% 90%  96%  Weight:   91 kg   Height:        Intake/Output Summary (Last 24 hours) at 11/10/2023 1412 Last data filed at 11/10/2023 0735 Gross per 24 hour  Intake 1088 ml  Output --  Net 1088 ml      11/10/2023    5:00 AM 11/09/2023    5:00 AM 11/08/2023    5:00 AM  Last 3 Weights  Weight (lbs) 200 lb 9.9 oz 200 lb 9.9 oz 202 lb 6.1 oz  Weight (kg) 91 kg 91 kg 91.8 kg      Telemetry/ECG  No telemetry available- Personally Reviewed  Physical Exam  GEN: No acute distress.   Neck: No JVD Cardiac: RRR, 3/6 systolic murmur Respiratory: Clear to auscultation bilaterally. GI: Soft, nontender, non-distended  MS: 1+ edema right lower extremity, left BKA, right transmetatarsal amputation  Assessment & Plan  Acute on chronic heart failure with preserved ejection fraction Moderate AS/moderate MR TEE on 10/24/2023 that showed LVEF of 60 to 65%, moderate MR, moderate aortic stenosis. Had worsening lower extremity edema and dyspnea on exertion that started on Friday.  Was started on oral Lasix  but continues to have lower extremity edema and dyspnea on exertion.  Elevated BNP of 336.4.   Chest x-ray found diffuse interstitial alveolar opacities throughout the lungs that are slightly improved from prior study.  Has hypoalbuminemia that is likely contributing to third spacing. Strict I's and O's Hold lasix  today with rising Cr. Eventually needs RHC but had positive blood cultures 6/24, would probably defer to  outpatient. Improved with IV diuresis. When Cr stabilizes, resume home lasix .  GDMT: Will consider starting Jardiance  and spironolactone pending diuresis.   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 Hemoglobin 8. Pending iron  infusion per primary team, agree, however IV iron  will need to await completion of IV abx per PharmD.  Anemia is likely contributing to shortness of breath.  She received a transfusion 7/10 per chart review, transfusion goal may need to be greater than 8 given valvular heart disease and heart failure.   Elevated high-sensitivity troponin 48 Severe three-vessel CAD Hyperlipidemia These are down significantly from initial hospitalization on 10/20/2023 and 10/21/2023.  This is likely secondary to demand ischemia from acute on chronic heart failure. Previously recommended for CABG and MVR but has not had this done yet because of complicating peripheral arterial disease Continue atorvastatin  40 mg daily   PAD Continue Plavix  75 mg daily.     For questions or updates, please contact [facility] Please consult [email or web address] for contact info under       Signed, Ola Raap A Jurney Overacker, MD

## 2023-11-10 NOTE — Progress Notes (Signed)
 PROGRESS NOTE   Subjective/Complaints:  Pt reports still having drainage- removed VAC 2 days ago- but drainage is scant per pt and OT.  Happy that Hb up to 8.0- after transfusion yesterday.  Started to pee a LOT at 5am this AM, but not before then- was small amounts til early this AM.  Mother is upset that she's having trouble breathing 5x in last 6 days.   Per Cards note, pt really needs a CABG and MVR, but have to hold off until doing better from L BKA.    ROS:   Pt denies SOB, abd pain, CP, N/V/C/D, and vision changes- still feels weak- wants to get more blood- IV iron - I agree with IV iron - since just got transfusion yesterday, don't see can transfuse again today.    Objective:   VAS US  LOWER EXTREMITY VENOUS (DVT) Result Date: 11/08/2023  Lower Venous DVT Study Patient Name:  Kayla Schmitt [facility]  Date of Exam:   11/08/2023 Medical Rec #: [id number]               Accession #:    [id number] Date of Birth: 01/21/1966                Patient Gender: F Patient Age:   58 years Exam Location:  [facility] Procedure:      VAS US  LOWER EXTREMITY VENOUS (DVT) Referring Phys: PAMELA LOVE --------------------------------------------------------------------------------  Indications: Edema.  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 Limitations: Poor ultrasound/tissue interface and Edema, acoustic shadowing secondary to plaque. Comparison Study: Prior negative right LEV done 11/03/23 Performing Technologist: Alberta Lis RVS  Examination Guidelines: A complete evaluation includes B-mode imaging, spectral Doppler, color Doppler, and power Doppler as needed of all accessible portions of each vessel. Bilateral testing is considered an integral part of a complete examination. Limited examinations for reoccurring indications may be performed as noted. The reflux portion of the exam is performed with the  patient in reverse Trendelenburg.  +---------+---------------+---------+-----------+----------+-------------------+ RIGHT    CompressibilityPhasicitySpontaneityPropertiesThrombus Aging      +---------+---------------+---------+-----------+----------+-------------------+ CFV      Full           Yes      Yes                                      +---------+---------------+---------+-----------+----------+-------------------+ SFJ      Full           Yes      Yes                                      +---------+---------------+---------+-----------+----------+-------------------+ FV Prox  Full                                                             +---------+---------------+---------+-----------+----------+-------------------+  FV Mid   Full           Yes      Yes                                      +---------+---------------+---------+-----------+----------+-------------------+ FV DistalFull           Yes      Yes                                      +---------+---------------+---------+-----------+----------+-------------------+ PFV      Full                                                             +---------+---------------+---------+-----------+----------+-------------------+ POP      Full           Yes      Yes                                      +---------+---------------+---------+-----------+----------+-------------------+ PTV      Full                                                             +---------+---------------+---------+-----------+----------+-------------------+ PERO                                                  Not well visualized +---------+---------------+---------+-----------+----------+-------------------+ Gastroc  Full                                                             +---------+---------------+---------+-----------+----------+-------------------+ SSV      Full                                                              +---------+---------------+---------+-----------+----------+-------------------+     Summary: RIGHT: - Findings appear essentially unchanged compared to previous examination. - There is no evidence of deep vein thrombosis in the lower extremity. However, portions of this examination were limited- see technologist comments above.  - No cystic structure found in the popliteal fossa. - Ultrasound characteristics of enlarged lymph nodes are noted in the groin.  LEFT: - No evidence of common femoral vein obstruction.  - Ultrasound characteristics of enlarged lymph nodes noted in the groin.  *See table(s) above for measurements and observations. Electronically signed by Gaile New MD on  11/08/2023 at 5:18:03 PM.    Final     Recent Labs    11/09/23 0453 11/10/23 0305  WBC 8.6 10.3  HGB 8.0* 8.2*  HCT 25.3* 26.4*  PLT 318 386   Recent Labs    11/09/23 0453 11/10/23 0305  NA 139 136  K 3.9 4.0  CL 101 100  CO2 27 26  GLUCOSE 117* 149*  BUN 35* 32*  CREATININE 1.27* 1.33*  CALCIUM  8.6* 8.6*     Intake/Output Summary (Last 24 hours) at 11/10/2023 0858 Last data filed at 11/10/2023 0735 Gross per 24 hour  Intake 1324 ml  Output --  Net 1324 ml        Physical Exam: Vital Signs Blood pressure 102/63, pulse 100, temperature 98.4 F (36.9 C), temperature source Oral, resp. rate 19, height 5' 4 (1.626 m), weight 91 kg, SpO2 96%.    General: awake, alert, appropriate, sitting EOB with OT- mother in room; NAD HENT: conjugate gaze; oropharynx moist CV: regular rhythm- mildly tachycardic; no JVD Pulmonary: improved air movement- no W/R/R today GI: soft, NT, ND, (+)BS Psychiatric: appropriate- interactive; a little anxious about d/c Neurological: Ox3  Ext: no clubbing, cyanosis, 1+ RLE edema- about the same- L BKA- incision looks great- no drainage seen on pt- but has on cover on w/c. Psych: pleasant and cooperative  Musculoskeletal:     Cervical back:  Neck supple. No tenderness. Left BK swollen, tender. Wearing shrinker     Skin:    General: Skin is warm and dry.     Comments: Left BK incision well approximated with staples. Sl serosanguinous drainage from corners.    Neurological:     Mental Status: She is alert and oriented to person, place, and time.     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 Prior neuro assessment is c/w 11/10/2023 exam.      Assessment/Plan: 1. Functional deficits which require 3+ hours per day of interdisciplinary therapy in a comprehensive inpatient rehab setting. Physiatrist is providing close team supervision and 24 hour management of active medical problems listed below. Physiatrist and rehab team continue to assess barriers to discharge/monitor patient progress toward functional and medical goals  Care Tool:  Bathing    Body parts bathed by patient: Right arm, Left arm, Chest, Abdomen, Left upper leg, Face, Front perineal area, Buttocks, Right upper leg, Right lower leg   Body parts bathed by helper: Right lower leg, Front perineal area, Buttocks Body parts n/a: Left lower leg   Bathing assist Assist Level: Set up assist     Upper Body Dressing/Undressing Upper body dressing   What is the patient wearing?: Pull over shirt    Upper body assist Assist Level: Independent with assistive device    Lower Body Dressing/Undressing Lower body dressing      What is the patient wearing?: Underwear/pull up     Lower body assist Assist for lower body dressing: Minimal Assistance - Patient > 75%     Toileting Toileting    Toileting assist Assist for toileting: Minimal Assistance - Patient > 75%     Transfers Chair/bed transfer  Transfers assist     Chair/bed transfer assist level: Supervision/Verbal cueing      Locomotion Ambulation   Ambulation assist   Ambulation activity did not occur: Safety/medical concerns (pain, decreased  balance, wound on R heel)          Walk 10 feet activity   Assist  Walk 10 feet activity did not occur: Safety/medical concerns (pain, decreased balance, wound on R heel)        Walk 50 feet activity   Assist Walk 50 feet with 2 turns activity did not occur: Safety/medical concerns (pain, decreased balance, wound on R heel)         Walk 150 feet activity   Assist Walk 150 feet activity did not occur: Safety/medical concerns (pain, decreased balance, wound on R heel)         Walk 10 feet on uneven surface  activity   Assist Walk 10 feet on uneven surfaces activity did not occur: Safety/medical concerns (pain, decreased balance, wound on R heel)         Wheelchair     Assist Is the patient using a wheelchair?: Yes Type of Wheelchair: Manual    Wheelchair assist level: Independent Max wheelchair distance: 146ft    Wheelchair 50 feet with 2 turns activity    Assist        Assist Level: Independent   Wheelchair 150 feet activity     Assist      Assist Level: Independent   Blood pressure 102/63, pulse 100, temperature 98.4 F (36.9 C), temperature source Oral, resp. rate 19, height 5' 4 (1.626 m), weight 91 kg, SpO2 96%.   Medical Problem List and Plan: 1. 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  supervision goals         Con't CIR PT and OT Asked therapy to NOT d/c pt from therapy- and reached out to nursing and SW- pt to NOT d/c Monday  Educated pt and mother again on her SOB and how to relates to many different issues- that we are balancing out her kidney and Cards/resp issues- as well as decompensating on 1 issue (BKA and anemia and need for IV ABX) makes her  decompensate on other issues as well (Resp/Cards, etc) 2.  Antithrombotics: -DVT/anticoagulation:  Pharmaceutical: continue Lovenox              -antiplatelet therapy: continue plavix .   3. Pain Management: continue Oxycodone  prn.   -on gabapentin  200mg  tid for phantom pain, RLS  -pain seems controlled 4. Mood/Behavior/Sleep: LCSW to follow for evaluation and support.               -antipsychotic agents: n/a 5. Neuropsych/cognition: This patient is capable of making decisions on her own behalf. 6. Skin/Wound Care: Pressure relief measures. Protein supplements added. Monitor for recurrent drainage.  -vac off             --will place dry dressing over corner of incision until drainage ceases  -continue shrinker. Pt can don/doff herself  7/12- will order smaller shrinkers 3XL per therapy request 7. Fluids/Electrolytes/Nutrition: Monitor I/O.   8.  MRSA bacteremia: Last CK 522-->continue Daptomycin  with EOT  11/17/23             --weekly CRP, Sed rate, CPK in addition to CBC/BMP.             --follow up with Dr. Dea 07/17 @ 9:30 am prior to EOT. Zinc , Vitamin C , Vitamin  D supplements started  -afebrile, clinically stable 9. T2DM on chronic insulin : Hgb A1C- 5.5 and well controlled (down from 6.4 earlier this year). Monitor BS ac/hs and use SSI for elevated BS.              -continue insulin  glargine 10 units and SSI was added today  D/c HS SSI, improved control over all. --consider increasing lantus --monitor for now  7/12-7/13 CBG's looking good- con't regimen  CBG (last 3)  Recent Labs    11/09/23 1641 11/09/23 2126 11/10/23 0622  GLUCAP 154* 132* 138*    10. Severe PAD s/p stents: On Plavix  and atorvastatin .  Monitor chronically draining area rea on right transmet site.   11. 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   -7/7 hgb down to 7.0---asymptomatic  -stools recently heme negative  -7/11 hgb stable at 7.3 (concentration effect) but believe she's  symptomatic from a stamina and exertion standpoint -she's diuresed enough where we can slowly give 1u prbc -20mg  iv lasix   7/12- will give IV iron  dosing, since Hb up to 8.0- but pt still symptomatic- with tachycardic- yellow MEWS and intermittently low O2 sats 7/13- Pharmacy doesn't want pt to receive any more IV iron  until OFF ABX of all kinds-  Hb up slightly to 8.2- pt really wants to get up to 10 before d/c- I explained that rules have changed- we cannot transfuse her to Hb of 10- but If Cards decides she needs 1 more unit, we will do that.  12.  CAD/HFpEF w/EF  - EF 60-65%:  with hx of severe AS, mod MR -pt with another episode of sob yesterday -pulmonary edema on cxr (don't believe this is infectious) -now on iv lasix  BID -strict I's and O's -7/11 weight down to 91.8kg  -K+ supp  -renal fxn holding resonably well  -appreciate cardiology consult. Considering jardiance , spiro  -spoke with pt/mother about why she's experiencing the sob/fluid overload 7/12- Weight down to 91 kg- even after blood- got IV Lasix  multiple times yesterday- finally started peeing a lot at 5am this morning.  Educated pt that Cards feels she needs CABG/MVR sooner than later to help SOB/pulmonary edema.  7/13- Weight 91 kg- stable from yesterday- having good output. Cards doesn't want to change IV Lasix  40 mg BID Filed Weights   11/08/23 0500 11/09/23 0500 11/10/23 0500  Weight: 91.8 kg 91 kg 91 kg     14.  Severe malnutrition: Albumin  level <1.5 w/ calcium  7.6-->corrects to 9.5. RD consulted  -improvement here will also help swelling.  -alb now improved to  2.0 15.  Abnormal LFTs: resolved  18. AKI: continue to monitor creatinine given increase in lasix   -7/11-- stable  7/12- Cr up to 1.27- will check in AM- and reduce Lasix  slightly if if continues to climb-  7/13- Cr 1.33- up from 1.27 and BUN 32- GFR down to 47- will d/w Cards since likely directly related to Lasix  40 mg IV BID-   I spent a total of  52   minutes on total care today- >50% coordination of care- due to  D/w with pt and mother at length about multiple reasons she's having SOB-  but we are trying to balance renal and cardiac/resp issues- also reviewed notes since yesterday- and d/w nursing and charge nurse about holding her d/c from tomorrow- til later- also reviewed labs.   LOS: 12 days A FACE TO FACE EVALUATION WAS PERFORMED  Elad Macphail 11/10/2023,  8:58 AM

## 2023-11-10 NOTE — Progress Notes (Signed)
 Sleepy first part of shift. Patient reports feeling sleepy most of day, since getting IV med for nausea at 1318. Woke at 0100. Mother at bedside providing hands on assistance. Couple of areas of blood on sheet. Reports Dr. Harden, only wants me to wear stump shrinker, no dressing. No further complaints of nausea this shift. Refused scheduled nystatin  powder, miralax , and ensure max. Overton Boggus A

## 2023-11-11 ENCOUNTER — Inpatient Hospital Stay (HOSPITAL_COMMUNITY)

## 2023-11-11 ENCOUNTER — Ambulatory Visit: Admitting: Cardiovascular Disease

## 2023-11-11 DIAGNOSIS — I25118 Atherosclerotic heart disease of native coronary artery with other forms of angina pectoris: Secondary | ICD-10-CM

## 2023-11-11 DIAGNOSIS — D649 Anemia, unspecified: Secondary | ICD-10-CM

## 2023-11-11 DIAGNOSIS — I5033 Acute on chronic diastolic (congestive) heart failure: Secondary | ICD-10-CM

## 2023-11-11 DIAGNOSIS — L8990 Pressure ulcer of unspecified site, unspecified stage: Secondary | ICD-10-CM | POA: Insufficient documentation

## 2023-11-11 LAB — CBC
HCT: 24.7 % — ABNORMAL LOW (ref 36.0–46.0)
Hemoglobin: 7.7 g/dL — ABNORMAL LOW (ref 12.0–15.0)
MCH: 28.5 pg (ref 26.0–34.0)
MCHC: 31.2 g/dL (ref 30.0–36.0)
MCV: 91.5 fL (ref 80.0–100.0)
Platelets: 405 K/uL — ABNORMAL HIGH (ref 150–400)
RBC: 2.7 MIL/uL — ABNORMAL LOW (ref 3.87–5.11)
RDW: 17.9 % — ABNORMAL HIGH (ref 11.5–15.5)
WBC: 9.7 K/uL (ref 4.0–10.5)
nRBC: 0 % (ref 0.0–0.2)

## 2023-11-11 LAB — GLUCOSE, CAPILLARY
Glucose-Capillary: 172 mg/dL — ABNORMAL HIGH (ref 70–99)
Glucose-Capillary: 160 mg/dL — ABNORMAL HIGH (ref 70–99)
Glucose-Capillary: 152 mg/dL — ABNORMAL HIGH (ref 70–99)

## 2023-11-11 LAB — BASIC METABOLIC PANEL WITH GFR
Anion gap: 9 (ref 5–15)
BUN: 32 mg/dL — ABNORMAL HIGH (ref 6–20)
CO2: 26 mmol/L (ref 22–32)
Calcium: 8.5 mg/dL — ABNORMAL LOW (ref 8.9–10.3)
Chloride: 100 mmol/L (ref 98–111)
Creatinine, Ser: 1.28 mg/dL — ABNORMAL HIGH (ref 0.44–1.00)
GFR, Estimated: 49 mL/min — ABNORMAL LOW (ref >60)
Glucose, Bld: 172 mg/dL — ABNORMAL HIGH (ref 70–99)
Potassium: 4 mmol/L (ref 3.5–5.1)
Sodium: 135 mmol/L (ref 135–145)

## 2023-11-11 LAB — SEDIMENTATION RATE: Sed Rate: 136 mm/h — ABNORMAL HIGH (ref 0–22)

## 2023-11-11 LAB — C-REACTIVE PROTEIN: CRP: 21.8 mg/dL — ABNORMAL HIGH (ref <1.0)

## 2023-11-11 LAB — CK: Total CK: 106 U/L (ref 38–234)

## 2023-11-11 LAB — PREPARE RBC (CROSSMATCH)

## 2023-11-11 MED ORDER — ENSURE MAX PROTEIN PO LIQD
11.0000 [oz_av] | Freq: Every day | ORAL | Status: DC
  Administered 2023-11-12: 11 [oz_av] via ORAL

## 2023-11-11 MED ORDER — FUROSEMIDE 40 MG PO TABS
40.0000 mg | ORAL_TABLET | Freq: Once | ORAL | Status: AC
  Administered 2023-11-11: 40 mg via ORAL
  Filled 2023-11-11: qty 1

## 2023-11-11 MED ORDER — SODIUM CHLORIDE 0.9% IV SOLUTION: Freq: Once | INTRAVENOUS | Status: AC

## 2023-11-11 MED ORDER — ASPIRIN 81 MG PO TBEC
81.0000 mg | DELAYED_RELEASE_TABLET | Freq: Every day | ORAL | Status: DC
  Administered 2023-11-12: 81 mg via ORAL
  Filled 2023-11-11: qty 1

## 2023-11-11 NOTE — Progress Notes (Signed)
 Nutrition Follow-up  DOCUMENTATION CODES:   Not applicable  INTERVENTION:   Continue Juven BID, each packet provides 80 calories, 8 grams of carbohydrate, 2.5  grams of protein (collagen), 7 grams of L-arginine and 7 grams of L-glutamine; supplement contains CaHMB, Vitamins C, E, B12 and Zinc  to promote wound healing.   Continue MVI with minerals daily.   Continue HS snack daily.   Continue zinc  sulfate 200 mg daily x 14 days and Vitamin C  250 mg daily for wound healing.  Ensure Max po daily, each supplement provides 150 kcal and 30 grams of protein.   NUTRITION DIAGNOSIS:   Increased nutrient needs related to post-op healing, wound healing as evidenced by estimated needs. - Still applicable   GOAL:   Patient will meet greater than or equal to 90% of their needs - Meeting   MONITOR:   PO intake, Supplement acceptance, Skin  REASON FOR ASSESSMENT:   Consult Diet education, Wound healing  ASSESSMENT:   58 yo female admitted with functional deficits d/t L BKA. PMH includes R TMA, HTN, HLD, DM, hypothyroidism, GERD, CAD, PVD, mitral regurgitation, aortic stenosis, MI, CHF, anemia.   S/P L BKA 6/24. S/P L BKA revision 6/25. Currently on a heart healthy carb modified diet , consuming 75-100% of meals. Today with decreased intake d/t nausea and SOB.   Pt states she is not feeling well this morning and is SOB, endorses nausea. Had 100% of her pancakes, [place] sausage, and banana. Only ordered chicken broth for lunch d/t not feeling well. Mom at bedside reports pt is picky at baseline. Changed 8 PM snack for pt as well as switched Ensure Max to 2PM instead of at bedtime per patients preference. Continues to drink Juven BID.   Per MD, pt getting blood transfusion today and switching to PO Lasix . Lower extremity edema.   Admit weight: 95.3 kg Current weight: 92.1 kg   + 2 RLE and + 1 LLE edema   Average Meal Intake: 7/11-7/14: 75-100%% intake x 8  recorded  meals  Nutritionally Relevant Medications: Scheduled Meds:  ascorbic acid   250 mg Oral Daily   cholecalciferol   1,000 Units Oral Daily   insulin  aspart  0-9 Units Subcutaneous TID WC   insulin  glargine-yfgn  10 Units Subcutaneous QHS   levothyroxine   150 mcg Oral QAC breakfast   nutrition supplement (JUVEN)  1 packet Oral BID BM   [START ON 11/12/2023] Ensure Max Protein  11 oz Oral Daily   zinc  sulfate (50mg  elemental zinc )  220 mg Oral Daily   Continuous Infusions:  DAPTOmycin  700 mg (11/11/23 1125)   Labs Reviewed: BUN 32 Creatinine 1.28 Calcium  8.5 Albumin  2.0 GFR 49 CBG ranges from 138-172 mg/dL over the last 24 hours HgbA1c 5.5   Diet Order:   Diet Order             Diet heart healthy/carb modified Room service appropriate? Yes; Fluid consistency: Thin  Diet effective now                   EDUCATION NEEDS:   Education needs have been addressed  Skin:  Skin Assessment: Skin Integrity Issues: Skin Integrity Issues:: DTI DTI: R heel Stage I: - Wound Vac: L BKA site  Last BM:  11/10/2023 TYPE 6  Height:   Ht Readings from Last 1 Encounters:  10/29/23 5' 4 (1.626 m)    Weight:   Wt Readings from Last 1 Encounters:  11/11/23 92.1 kg    Ideal Body  Weight:  54.5 kg  BMI:  Body mass index is 34.85 kg/m.  Estimated Nutritional Needs:   Kcal:  1700-1900  Protein:  100-120 gm  Fluid:  >/= 1.8 L   Kayla Schmitt, RD Registered Dietitian  See Amion for more information

## 2023-11-11 NOTE — Progress Notes (Signed)
  Progress Note Patient Name: Panagiota Perfetti Date of Encounter: 11/11/2023 [facility] Cardiologist: Vishnu P Mallipeddi, MD   Interval Summary   Incomplete I/Os. Cr stable at 1.28. Hgb 7.7.  Reports feeling short of breath.    Vital Signs Vitals:   11/10/23 2002 11/11/23 0445 11/11/23 0500 11/11/23 1100  BP: 120/68 110/67  117/65  Pulse: (!) 104   (!) 106  Resp: 18 18  19   Temp: 98.3 F (36.8 C) 98.3 F (36.8 C)  99.4 F (37.4 C)  TempSrc:    Oral  SpO2: 96% 98%  96%  Weight:   92.1 kg   Height:        Intake/Output Summary (Last 24 hours) at 11/11/2023 1114 Last data filed at 11/11/2023 0800 Gross per 24 hour  Intake 1076 ml  Output --  Net 1076 ml      11/11/2023    5:00 AM 11/10/2023    5:00 AM 11/09/2023    5:00 AM  Last 3 Weights  Weight (lbs) 203 lb 0.7 oz 200 lb 9.9 oz 200 lb 9.9 oz  Weight (kg) 92.1 kg 91 kg 91 kg      Telemetry/ECG  No telemetry available- Personally Reviewed  Physical Exam  GEN: No acute distress.   Neck: No JVD Cardiac: RRR, 3/6 systolic murmur Respiratory: Clear to auscultation bilaterally. GI: Soft, nontender, non-distended  MS: 1+ edema right lower extremity, left BKA, right transmetatarsal amputation  Assessment & Plan  87-year female with history of PAD, CAD, MR, T2DM, hypertension we are consulted for evaluation of heart failure  Acute on chronic diastolic heart failure: Presented with volume overload and, had worsening lower extremity edema and dyspnea.  Has been diuresed with IV Lasix .  Lasix  held yesterday given rising creatinine - Lower extremity edema on exam but otherwise appears euvolemic.  Suspect hypoalbuminemia (2.0) contributing to edema.  Switch to p.o. Lasix  today  CAD: Admitted with NSTEMI 07/2022.  Cath showed severe multivessel disease.  Severe MR on echo.  Had plan for CABG/MVR but this was postponed given multiple ongoing medical issues. - Continue statin - She has required prior transfusion  and hemoglobin down again.  She reports some bleeding at the site of her wound.  Recommend switching from Plavix  to aspirin  81 mg daily for bleeding risk  Aortic stenosis: Moderate to severe by TEE 09/2023  Mild regurgitation: Severe by echo in 2024.  TEE 09/2023 showed mild to moderate MR  MRSA bacteremia: Hospitalized 09/2023 with MRSA bacteremia.  TEE 10/24/2023 showed moderate MR, moderate to severe aortic stenosis, no endocarditis.  She had necrotizing fasciitis, underwent left below the knee amputation.  Anemia: Hemoglobin down to 7.7. Recommend transfuse for goal Hgb>8 given cardiac issues.  Suggest switching from plavix  to ASA 81 mg daily to decrease bleeding risk   For questions or updates, please contact [facility] Please consult [email or web address] for contact info under       Signed, Lonni LITTIE Nanas, MD

## 2023-11-11 NOTE — Progress Notes (Addendum)
 PROGRESS NOTE   Subjective/Complaints: Continues to have shortness of breath intermittently at times, needed O2 last night, discussed with SW and team and patient will not qualify for O2 at home   ROS:   +SOB, abd pain, CP, N/V/C/D, and vision changes   Objective:   No results found.   Recent Labs    11/10/23 0305 11/11/23 0601  WBC 10.3 9.7  HGB 8.2* 7.7*  HCT 26.4* 24.7*  PLT 386 405*   Recent Labs    11/10/23 0305 11/11/23 0601  NA 136 135  K 4.0 4.0  CL 100 100  CO2 26 26  GLUCOSE 149* 172*  BUN 32* 32*  CREATININE 1.33* 1.28*  CALCIUM  8.6* 8.5*     Intake/Output Summary (Last 24 hours) at 11/11/2023 9061 Last data filed at 11/11/2023 0800 Gross per 24 hour  Intake 1076 ml  Output --  Net 1076 ml        Physical Exam: Vital Signs Blood pressure 110/67, pulse (!) 104, temperature 98.3 F (36.8 C), resp. rate 18, height 5' 4 (1.626 m), weight 92.1 kg, SpO2 98%.    General: awake, alert, appropriate, sitting EOB with OT- mother in room; NAD HENT: conjugate gaze; oropharynx moist CV: regular rhythm- mildly tachycardic; no JVD Pulmonary: improved air movement- no W/R/R today GI: soft, NT, ND, (+)BS Psychiatric: appropriate- interactive; a little anxious about d/c Neurological: Ox3  Ext: no clubbing, cyanosis, 1+ RLE edema- about the same- L BKA- incision looks great- no drainage seen on pt- but has on cover on w/c. Psych: pleasant and cooperative  Musculoskeletal:     Cervical back: Neck supple. No tenderness. Left BK swollen, tender. Wearing shrinker     Skin:    General: Skin is warm and dry.     Comments: Left BK incision well approximated with staples. Sl serosanguinous drainage from corners.    Neurological:     Mental Status: She is alert and oriented to person, place, and time.     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 Prior neuro assessment is c/w 11/11/2023 exam.  Functional mobility: propelling WC independently     Assessment/Plan: 1. Functional deficits which require 3+ hours per day of interdisciplinary therapy in a comprehensive inpatient rehab setting. Physiatrist is providing close team supervision and 24 hour management of active medical problems listed below. Physiatrist and rehab team continue to assess barriers to discharge/monitor patient progress toward functional and medical goals  Care Tool:  Bathing    Body parts bathed by patient: Right arm, Left arm, Chest, Abdomen, Left upper leg, Face, Front perineal area, Buttocks, Right upper leg, Right lower leg   Body parts bathed by helper: Right lower leg, Front perineal area, Buttocks Body parts n/a: Left lower leg   Bathing assist Assist Level: Set up assist     Upper Body Dressing/Undressing Upper body dressing   What is the patient wearing?: Pull over shirt    Upper body assist Assist Level: Independent with assistive device  Lower Body Dressing/Undressing Lower body dressing      What is the patient wearing?: Underwear/pull up     Lower body assist Assist for lower body dressing: Minimal Assistance - Patient > 75%     Toileting Toileting    Toileting assist Assist for toileting: Minimal Assistance - Patient > 75%     Transfers Chair/bed transfer  Transfers assist     Chair/bed transfer assist level: Supervision/Verbal cueing     Locomotion Ambulation   Ambulation assist   Ambulation activity did not occur: Safety/medical concerns (pain, decreased balance, wound on R heel)          Walk 10 feet activity   Assist  Walk 10 feet activity did not occur: Safety/medical concerns (pain, decreased balance, wound on R heel)        Walk 50 feet activity   Assist Walk 50 feet with 2 turns activity did not  occur: Safety/medical concerns (pain, decreased balance, wound on R heel)         Walk 150 feet activity   Assist Walk 150 feet activity did not occur: Safety/medical concerns (pain, decreased balance, wound on R heel)         Walk 10 feet on uneven surface  activity   Assist Walk 10 feet on uneven surfaces activity did not occur: Safety/medical concerns (pain, decreased balance, wound on R heel)         Wheelchair     Assist Is the patient using a wheelchair?: Yes Type of Wheelchair: Manual    Wheelchair assist level: Independent Max wheelchair distance: 150    Wheelchair 50 feet with 2 turns activity    Assist        Assist Level: Independent   Wheelchair 150 feet activity     Assist      Assist Level: Independent   Blood pressure 110/67, pulse (!) 104, temperature 98.3 F (36.8 C), resp. rate 18, height 5' 4 (1.626 m), weight 92.1 kg, SpO2 98%.   Medical Problem List and Plan: 1. 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  supervision goals         Con't CIR PT and OT Asked therapy to NOT d/c pt from therapy- and reached out to nursing and SW- pt to NOT d/c Monday  Educated pt and mother again on her SOB and how to relates to many different issues- that we are balancing out her kidney and Cards/resp issues- as well as decompensating on 1 issue (BKA and anemia and need for IV ABX) makes her decompensate on other issues as well (Resp/Cards, etc)  Notes reviewed and is propelling WC independently  2.  Antithrombotics: -DVT/anticoagulation:  Pharmaceutical: continue Lovenox              -antiplatelet therapy: Plavix  changed to aspirin  by cardiology to reduce bleeding risk.   3. Pain Management: continue Oxycodone  prn.   -on gabapentin  200mg  tid for phantom pain, RLS  -pain seems controlled 4.  Mood/Behavior/Sleep: LCSW to follow for evaluation and support.               -antipsychotic agents: n/a 5. Neuropsych/cognition: This patient is capable of making decisions on her own behalf. 6. Skin/Wound Care: Pressure relief measures. Protein supplements added. Monitor  for recurrent drainage.  -vac off             --will place dry dressing over corner of incision until drainage ceases  -continue shrinker. Pt can don/doff herself  7/12- will order smaller shrinkers 3XL per therapy request 7. Fluids/Electrolytes/Nutrition: Monitor I/O.   8.  MRSA bacteremia: Last CK 522-->continue Daptomycin  with EOT  11/17/23             --weekly CRP, Sed rate, CPK in addition to CBC/BMP.             --follow up with Dr. Dea 07/17 @ 9:30 am prior to EOT. Zinc , Vitamin C , Vitamin D  supplements started  -afebrile, clinically stable 9. T2DM on chronic insulin : Hgb A1C- 5.5 and well controlled (down from 6.4 earlier this year). Monitor BS ac/hs and use SSI for elevated BS.              -continue insulin  glargine 10 units and SSI was added today  D/c HS SSI, improved control over all. --consider increasing lantus --monitor for now  7/12-7/13 CBG's looking good- con't regimen  CBG (last 3)  Recent Labs    11/10/23 1643 11/10/23 2133 11/11/23 0554  GLUCAP 136* 178* 172*    10. Severe PAD s/p stents: On Plavix  and atorvastatin .  Monitor chronically draining area rea on right transmet site.   11. 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   -7/7 hgb down to 7.0---asymptomatic  -stools recently heme negative  -7/11 hgb stable at 7.3 (concentration effect) but believe she's symptomatic from a stamina and exertion standpoint -she's diuresed enough where we can slowly give 1u prbc -20mg  iv lasix   7/12- will give IV iron  dosing, since Hb up to 8.0- but pt still symptomatic- with tachycardic- yellow MEWS and intermittently low O2 sats 7/13- Pharmacy doesn't want pt to receive any more IV  iron  until OFF ABX of all kinds-  Hb up slightly to 8.2- pt really wants to get up to 10 before d/c- I explained that rules have changed- we cannot transfuse her to Hb of 10- but If Cards decides she needs 1 more unit, we will do that.   7/14: additional 1U PRBC ordered as per cardiology recommendations 12.  CAD/HFpEF w/EF  - EF 60-65%:  with hx of severe AS, mod MR -pt with another episode of sob yesterday -pulmonary edema on cxr (don't believe this is infectious) -now on iv lasix  BID -strict I's and O's -7/11 weight down to 91.8kg  -K+ supp  -renal fxn holding resonably well  -appreciate cardiology consult. Considering jardiance , spiro  -spoke with pt/mother about why she's experiencing the sob/fluid overload 7/12- Weight down to 91 kg- even after blood- got IV Lasix  multiple times yesterday- finally started peeing a lot at 5am this morning.  Educated pt that Cards feels she needs CABG/MVR sooner than later to help SOB/pulmonary edema.  7/13- Weight 91 kg- stable from yesterday- having good output. Cards doesn't want to change IV Lasix  40 mg BID 7/14: meds reviewed and is not receiving Lasix - ordered 40x1 since she is having SOB, CR reviewed and is improved, will monitor tomorrow, CXR ordered Filed Weights   11/09/23 0500 11/10/23 0500 11/11/23 0500  Weight: 91 kg 91 kg 92.1 kg     14.  Severe malnutrition: Albumin  level <1.5 w/ calcium  7.6-->corrects to 9.5. RD consulted  -improvement here will also help swelling.  -alb now improved to  2.0 15.  Abnormal LFTs: resolved  18. AKI: continue to monitor creatinine given increase in lasix   -7/11-- stable  7/12- Cr up to 1.27- will check in AM- and reduce Lasix  slightly if if continues to climb-  7/13- Cr 1.33- up from 1.27 and BUN 32- GFR down to 47- will d/w Cards since likely directly related to Lasix  40 mg IV BID-    LOS: 13 days A FACE TO FACE EVALUATION WAS PERFORMED  Sven P Yazaira Speas 11/11/2023, 9:38 AM

## 2023-11-11 NOTE — Progress Notes (Incomplete Revision)
 Inpatient Rehabilitation Discharge Medication Review by a Pharmacist   A complete drug regimen review was completed for this patient to identify any potential clinically significant medication issues.   High Risk Drug Classes Is patient taking? Indication by Medication  Antipsychotic No   Anticoagulant No    Antibiotic Yes, as an intravenous medication Daptomycin - MRSA bacteremia (thru 11/17/23)  Opioid Yes Percocet- pain   Antiplatelet Yes Aspirin  - PAD   Hypoglycemics/insulin  Yes Insulin - DM   Vasoactive Medication No    Chemotherapy No    Other Yes Acetaminophen - pain   Atorvastatin - HLD   Gabapentin - neuropathy  Ferrous sulfate - supplement  Melatonin - sleep Miralax  - constipation  Pantoprazole - reflux  Robaxin - muscle spasms  Synthroid - hypothyroidism  Vitamin C , Vitamin D , Zinc - supplements Juven- nutrititional supplement         Type of Medication Issue Identified Description of Issue Recommendation(s)  Drug Interaction(s) (clinically significant)        Duplicate Therapy        Allergy        No Medication Administration End Date        Incorrect Dose        Additional Drug Therapy Needed        Significant med changes from prior encounter (inform family/care partners about these prior to discharge). PTA meds not resumed- Lasix  , toprol  nitroglycerin   Communicate relevant medication changes to patient/family members at discharge from CIR.    Restart or discontinue PTA meds not resumed in CIR at discharge if clinically indicated.   Other            Clinically significant medication issues were identified that warrant physician communication and completion of prescribed/recommended actions by midnight of the next day:  No   Name of provider notified for urgent issues identified:    Provider Method of Notification:      Pharmacist comments:    Time spent performing this drug regimen review (minutes): 20   Levorn Gaskins, [place] Clinical Pharmacist 11/11/2023  12:04 PM

## 2023-11-11 NOTE — Plan of Care (Signed)
  Problem: Consults Goal: RH LIMB LOSS PATIENT EDUCATION Description: Description: See Patient Education module for eduction specifics. Outcome: Progressing   Problem: RH BOWEL ELIMINATION Goal: RH STG MANAGE BOWEL WITH ASSISTANCE Description: STG Manage Bowel with mod I Assistance. Outcome: Progressing Goal: RH STG MANAGE BOWEL W/MEDICATION W/ASSISTANCE Description: STG Manage Bowel with Medication with mod I Assistance. Outcome: Progressing   Problem: RH SAFETY Goal: RH STG ADHERE TO SAFETY PRECAUTIONS W/ASSISTANCE/DEVICE Description: STG Adhere to Safety Precautions With cues Assistance/Device. Outcome: Progressing   Problem: RH PAIN MANAGEMENT Goal: RH STG PAIN MANAGED AT OR BELOW PT'S PAIN GOAL Description: < 4 with prns Outcome: Progressing   Problem: RH KNOWLEDGE DEFICIT LIMB LOSS Goal: RH STG INCREASE KNOWLEDGE OF SELF CARE AFTER LIMB LOSS Description: Patient and mother will be able to manage care at discharge using educational resources for medications, skin care, and dietary modification independently Outcome: Progressing   Problem: RH SAFETY Goal: RH STG ADHERE TO SAFETY PRECAUTIONS W/ASSISTANCE/DEVICE Description: STG Adhere to Safety Precautions With cues Assistance/Device. Outcome: Progressing   Problem: RH PAIN MANAGEMENT Goal: RH STG PAIN MANAGED AT OR BELOW PT'S PAIN GOAL Description: < 4 with prns Outcome: Progressing   Problem: RH KNOWLEDGE DEFICIT LIMB LOSS Goal: RH STG INCREASE KNOWLEDGE OF SELF CARE AFTER LIMB LOSS Description: Patient and mother will be able to manage care at discharge using educational resources for medications, skin care, and dietary modification independently Outcome: Progressing

## 2023-11-11 NOTE — Progress Notes (Signed)
 At start of shift, complained of feeling winded. Afraid to lay down. O2 sat 90% RA, O2 applied at 1L/m, brought O2 sats up to 95%.  Dry, NP cough, PRN robitussin given at 1958. At 1945, prn robaxin  given for complaint of muscle spasms. Refused nystatin  powder and ensure. 3 foam dressings to RLE were changed or removed. SBA after setup for scoot transfers to and from [facility]. 3 small type 6 BM's. Vedanshi Massaro A

## 2023-11-11 NOTE — Discharge Summary (Signed)
 Physician Discharge Summary  Patient ID: Kayla Schmitt MRN: [id number] DOB/AGE: 1966/01/14 58 y.o.  Admit date: 10/29/2023 Discharge date: 7/15 /2025  Discharge Diagnoses:  Principal Problem:   S/P BKA (below knee amputation) unilateral, left (HCC) Active Problems:   Anemia   Acute on chronic diastolic heart failure (HCC)   Unilateral complete BKA, left, sequela (HCC)   Pressure injury of skin   Anxiety state DVT prophylaxis Pain management Type 2 diabetes mellitus MRSA bacteremia Severe PAD status post stents History of GI bleed due to AVMs Diastolic congestive heart failure Decreased nutritional storage AKI Restless leg syndrome  Discharged Condition: Stable  Significant Diagnostic Studies: DG Chest 2 View Result Date: 11/11/2023 CLINICAL DATA:  Shortness of breath. EXAM: CHEST - 2 VIEW COMPARISON:  November 07, 2023. FINDINGS: Stable cardiomediastinal silhouette. Left-sided PICC line is noted with distal tip looped within SVC. Increased bilateral patchy and interstitial airspace opacities are noted suggesting worsening pneumonia or pulmonary edema. Small pleural effusions are noted. Bony thorax is unremarkable. IMPRESSION: Significantly increased bilateral lung opacities are noted suggesting worsening pneumonia or edema with small pleural effusions. Electronically Signed   By: Lynwood Landy Raddle M.D.   On: 11/11/2023 15:08   VAS US  LOWER EXTREMITY VENOUS (DVT) Result Date: 11/08/2023  Lower Venous DVT Study Patient Name:  Kayla Schmitt [facility]  Date of Exam:   11/08/2023 Medical Rec #: [id number]               Accession #:    [id number] Date of Birth: 05-May-1965                Patient Gender: F Patient Age:   78 years Exam Location:  [facility] Procedure:      VAS US  LOWER EXTREMITY VENOUS (DVT) Referring Phys: PAMELA LOVE --------------------------------------------------------------------------------  Indications: Edema.  Risk Factors: Right Transmet amputation. Left BKA  10/22/2023 with revison 10/23/23. Limitations: Poor ultrasound/tissue interface and Edema, acoustic shadowing secondary to plaque. Comparison Study: Prior negative right LEV done 11/03/23 Performing Technologist: Alberta Lis RVS  Examination Guidelines: A complete evaluation includes B-mode imaging, spectral Doppler, color Doppler, and power Doppler as needed of all accessible portions of each vessel. Bilateral testing is considered an integral part of a complete examination. Limited examinations for reoccurring indications may be performed as noted. The reflux portion of the exam is performed with the patient in reverse Trendelenburg.  +---------+---------------+---------+-----------+----------+-------------------+ RIGHT    CompressibilityPhasicitySpontaneityPropertiesThrombus Aging      +---------+---------------+---------+-----------+----------+-------------------+ CFV      Full           Yes      Yes                                      +---------+---------------+---------+-----------+----------+-------------------+ SFJ      Full           Yes      Yes                                      +---------+---------------+---------+-----------+----------+-------------------+ FV Prox  Full                                                             +---------+---------------+---------+-----------+----------+-------------------+  FV Mid   Full           Yes      Yes                                      +---------+---------------+---------+-----------+----------+-------------------+ FV DistalFull           Yes      Yes                                      +---------+---------------+---------+-----------+----------+-------------------+ PFV      Full                                                             +---------+---------------+---------+-----------+----------+-------------------+ POP      Full           Yes      Yes                                       +---------+---------------+---------+-----------+----------+-------------------+ PTV      Full                                                             +---------+---------------+---------+-----------+----------+-------------------+ PERO                                                  Not well visualized +---------+---------------+---------+-----------+----------+-------------------+ Gastroc  Full                                                             +---------+---------------+---------+-----------+----------+-------------------+ SSV      Full                                                             +---------+---------------+---------+-----------+----------+-------------------+     Summary: RIGHT: - Findings appear essentially unchanged compared to previous examination. - There is no evidence of deep vein thrombosis in the lower extremity. However, portions of this examination were limited- see technologist comments above.  - No cystic structure found in the popliteal fossa. - Ultrasound characteristics of enlarged lymph nodes are noted in the groin.  LEFT: - No evidence of common femoral vein obstruction.  - Ultrasound characteristics of enlarged lymph nodes noted in the groin.  *See table(s) above for measurements and observations. Electronically signed by Gaile New MD on  11/08/2023 at 5:18:03 PM.    Final    DG Chest 2 View Result Date: 11/07/2023 CLINICAL DATA:  857905 Dyspnea on exertion 142094 EXAM: CHEST - 2 VIEW COMPARISON:  11/05/2023. FINDINGS: Redemonstration of diffuse alveolar and interstitial opacities throughout bilateral lungs, overall slightly improved since the prior study. Findings are nonspecific and differential diagnosis includes pulmonary edema, ARDS or multilobar pneumonia. There is subtle blunting of bilateral posterior costophrenic angles, favoring bilateral trace pleural effusions. No pneumothorax. Stable cardio-mediastinal silhouette. No  acute osseous abnormalities. The soft tissues are within normal limits. IMPRESSION: *Redemonstration of diffuse alveolar and interstitial opacities throughout bilateral lungs, overall slightly improved since the prior study. Findings are nonspecific and differential diagnosis includes pulmonary edema, ARDS or multilobar pneumonia. Electronically Signed   By: Ree Molt M.D.   On: 11/07/2023 17:28   DG Chest 2 View Result Date: 11/05/2023 CLINICAL DATA:  Shortness of breath EXAM: CHEST - 2 VIEW COMPARISON:  11/02/2023 FINDINGS: Left PICC line is in place. The catheter now courses into the right innominate vein with the tip likely in the right internal jugular vein. Cardiomegaly, vascular congestion. Diffuse bilateral airspace disease, worsening since prior study. No effusions or pneumothorax. No acute bony abnormality. IMPRESSION: Left PICC line now courses into the right innominate vein with the tip in the right internal jugular vein. Worsening diffuse bilateral airspace disease, likely edema although infection not excluded. Electronically Signed   By: Franky Crease M.D.   On: 11/05/2023 20:30   VAS US  LOWER EXTREMITY VENOUS (DVT) Result Date: 11/03/2023  Lower Venous DVT Study Patient Name:  Kayla Schmitt [facility]  Date of Exam:   11/03/2023 Medical Rec #: [id number]               Accession #:    [id number] Date of Birth: [birth year]                Patient Gender: F Patient Age:   10 years Exam Location:  [facility] Procedure:      VAS US  LOWER EXTREMITY VENOUS (DVT) Referring Phys: SVEN ELKS --------------------------------------------------------------------------------  Indications: Swelling, and Edema.  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 Limitations: Poor ultrasound/tissue interface and body habitus. Comparison Study: No priors. Performing Technologist: Ricka Sturdivant-Jones RDMS, RVT  Examination Guidelines: A complete  evaluation includes B-mode imaging, spectral Doppler, color Doppler, and power Doppler as needed of all accessible portions of each vessel. Bilateral testing is considered an integral part of a complete examination. Limited examinations for reoccurring indications may be performed as noted. The reflux portion of the exam is performed with the patient in reverse Trendelenburg.  +---------+---------------+---------+-----------+----------+--------------+ RIGHT    CompressibilityPhasicitySpontaneityPropertiesThrombus Aging +---------+---------------+---------+-----------+----------+--------------+ CFV      Full           Yes      Yes                                 +---------+---------------+---------+-----------+----------+--------------+ SFJ      Full                                                        +---------+---------------+---------+-----------+----------+--------------+ FV Prox  Full                                                        +---------+---------------+---------+-----------+----------+--------------+  FV Mid   Full           Yes      Yes                                 +---------+---------------+---------+-----------+----------+--------------+ FV DistalFull                                                        +---------+---------------+---------+-----------+----------+--------------+ PFV      Full                                                        +---------+---------------+---------+-----------+----------+--------------+ POP      Full           Yes      Yes                                 +---------+---------------+---------+-----------+----------+--------------+ PTV      Full                                         appears patent +---------+---------------+---------+-----------+----------+--------------+ PERO     Full                                         appears patent  +---------+---------------+---------+-----------+----------+--------------+ Calf veins were poorly visualized in 2D imaging. Appears patent with color imaging.  +----+---------------+---------+-----------+----------+--------------+ LEFTCompressibilityPhasicitySpontaneityPropertiesThrombus Aging +----+---------------+---------+-----------+----------+--------------+ CFV Full           Yes      Yes                                 +----+---------------+---------+-----------+----------+--------------+ SFJ Full                                                        +----+---------------+---------+-----------+----------+--------------+     Summary: RIGHT: - There is no evidence of deep vein thrombosis in the lower extremity.  - No cystic structure found in the popliteal fossa. - Ultrasound characteristics of enlarged lymph nodes are noted in the groin.  LEFT: - No evidence of common femoral vein obstruction.   *See table(s) above for measurements and observations. Electronically signed by Lonni Gaskins MD on 11/03/2023 at 12:10:37 PM.    Final    DG CHEST PORT 1 VIEW Result Date: 11/02/2023 CLINICAL DATA:  141880 SOB (shortness of breath) 141880 EXAM: PORTABLE CHEST 1 VIEW COMPARISON:  Chest x-ray 04/03/2023 FINDINGS: Interval placement of left PICC with tip overlying the superior cavoatrial junction. Prominent cardiac silhouette likely due to AP portable technique. The heart and mediastinal contours are unchanged. Diffuse patchy airspace and interstitial  opacities. No pleural effusion. No pneumothorax. No acute osseous abnormality. IMPRESSION: 1. Diffuse patchy airspace and interstitial opacities. Finding could represent pulmonary edema versus infection. 2.  Aortic Atherosclerosis (ICD10-I70.0). Electronically Signed   By: Morgane  Naveau M.D.   On: 11/02/2023 21:30   US  EKG SITE RITE Result Date: 10/25/2023 If Site Rite image not attached, placement could not be confirmed due to current  cardiac rhythm.  ECHO TEE Result Date: 10/24/2023    TRANSESOPHOGEAL ECHO REPORT   Patient Name:   Kayla Schmitt [facility] Date of Exam: 10/24/2023 Medical Rec #:  [id number]              Height:       64.0 in Accession #:    [id number]             Weight:       221.8 lb Date of Birth:  1965/07/31               BSA:          2.044 m Patient Age:    57 years               BP:           156/65 mmHg Patient Gender: F                      HR:           84 bpm. Exam Location:  Inpatient Procedure: Transesophageal Echo, Cardiac Doppler, Color Doppler and Saline            Contrast Bubble Study (Both Spectral and Color Flow Doppler were            utilized during procedure). Indications:     Endocarditis  History:         Patient has prior history of Echocardiogram examinations, most                  recent 10/21/2023. CAD; Risk Factors:Hypertension and                  Dyslipidemia.  Sonographer:     Philomena Daring Referring Phys:  [id number] TAYLOR A PARCELLS Diagnosing Phys: Madonna Large PROCEDURE: After discussion of the risks and benefits of a TEE, an informed consent was obtained from the patient. The transesophogeal probe was passed without difficulty through the esophogus of the patient. Sedation performed by different physician. The patient was monitored while under deep sedation. Anesthestetic sedation was provided intravenously by Anesthesiology: 482mg  of Propofol , 60mg  of Lidocaine . The patient developed no complications during the procedure.  IMPRESSIONS  1. Left ventricular ejection fraction, by estimation, is 60 to 65%. The left ventricle has normal function. The left ventricle has no regional wall motion abnormalities.  2. Right ventricular systolic function is normal. The right ventricular size is normal.  3. No left atrial/left atrial appendage thrombus was detected. The LAA emptying velocity was 66 cm/s.  4. The mitral valve is degenerative. Mild to moderate mitral valve regurgitation. No evidence of mitral  stenosis.  5. Native valve, calcified, reduced leaflet excursion, mild to moderate aortic regurgitation, moderate to severe aortic stenosis (peak velocity 3.4 m/s, mean gradient 27 mmHg, aortic valve area per VTI 1.07 cm, dimensional index 0.26).  6. There is mild (Grade II) layered plaque involving the descending aorta.  7. Agitated saline contrast bubble study was negative, with no evidence of any interatrial shunt. Conclusion(s)/Recommendation(s): No evidence of vegetation/infective endocarditis on  this transesophageael echocardiogram. FINDINGS  Left Ventricle: Left ventricular ejection fraction, by estimation, is 60 to 65%. The left ventricle has normal function. The left ventricle has no regional wall motion abnormalities. The left ventricular internal cavity size was normal in size. Right Ventricle: The right ventricular size is normal. Right vetricular wall thickness was not well visualized. Right ventricular systolic function is normal. Left Atrium: Left atrial size was normal in size. No left atrial/left atrial appendage thrombus was detected. The LAA emptying velocity was 66 cm/s. Right Atrium: Right atrial size was normal in size. Prominent Eustachian valve. Pericardium: There is no evidence of pericardial effusion. Mitral Valve: The mitral valve is degenerative in appearance. There is mild thickening of the mitral valve leaflet(s). Normal mobility of the mitral valve leaflets. Mild mitral annular calcification. Mild to moderate mitral valve regurgitation. No evidence of mitral valve stenosis. There is no evidence of mitral valve vegetation. Tricuspid Valve: The tricuspid valve is grossly normal. Tricuspid valve regurgitation is trivial. No evidence of tricuspid stenosis. There is no evidence of tricuspid valve vegetation. Aortic Valve: Native valve, calcified, reduced leaflet excursion, mild to moderate aortic regurgitation, moderate to severe aortic stenosis (peak velocity 3.4 m/s, mean gradient 27  mmHg, aortic valve area per VTI 1.07 cm, dimensional index 0.26). Aortic  valve mean gradient measures 27.2 mmHg. Aortic valve peak gradient measures 46.3 mmHg. Aortic valve area, by VTI measures 1.07 cm. There is no evidence of aortic valve vegetation. Pulmonic Valve: The pulmonic valve was grossly normal. Pulmonic valve regurgitation is trivial. No evidence of pulmonic stenosis. There is no evidence of pulmonic valve vegetation. Aorta: The aortic root and ascending aorta are structurally normal, with no evidence of dilitation. There is mild (Grade II) layered plaque involving the descending aorta. Venous: The left lower pulmonary vein, left upper pulmonary vein, right upper pulmonary vein and right lower pulmonary vein are normal. IAS/Shunts: The interatrial septum appears to be lipomatous. The atrial septum is grossly normal. Agitated saline contrast was given intravenously to evaluate for intracardiac shunting. Agitated saline contrast bubble study was negative, with no evidence  of any interatrial shunt.  LEFT VENTRICLE PLAX 2D LVOT diam:     2.30 cm LV SV:         74 LV SV Index:   36 LVOT Area:     4.15 cm  AORTIC VALVE                     PULMONIC VALVE AV Area (Vmax):    1.10 cm      RVOT Peak grad: 4 mmHg AV Area (Vmean):   1.16 cm AV Area (VTI):     1.07 cm AV Vmax:           340.25 cm/s AV Vmean:          243.250 cm/s AV VTI:            0.689 m AV Peak Grad:      46.3 mmHg AV Mean Grad:      27.2 mmHg LVOT Vmax:         89.90 cm/s LVOT Vmean:        68.100 cm/s LVOT VTI:          0.178 m LVOT/AV VTI ratio: 0.26  AORTA Ao Root diam: 3.10 cm Ao Asc diam:  3.50 cm  SHUNTS Systemic VTI:  0.18 m Systemic Diam: 2.30 cm Pulmonic VTI:  0.161 m Kayla Schmitt Electronically signed by [employer] Signature  Date/Time: 10/24/2023/1:56:25 PM    Final    EP STUDY Result Date: 10/24/2023 See surgical note for result.  ECHOCARDIOGRAM COMPLETE Result Date: 10/21/2023    ECHOCARDIOGRAM REPORT   Patient Name:    Kayla Schmitt [facility] Date of Exam: 10/21/2023 Medical Rec #:  [id number]              Height:       64.0 in Accession #:    [id number]             Weight:       221.8 lb Date of Birth:  1965-12-28               BSA:          2.044 m Patient Age:    57 years               BP:           125/63 mmHg Patient Gender: F                      HR:           87 bpm. Exam Location:  Inpatient Procedure: 2D Echo, Cardiac Doppler and Color Doppler (Both Spectral and Color            Flow Doppler were utilized during procedure). Indications:    Elevated troponin  History:        Patient has prior history of Echocardiogram examinations, most                 recent 04/04/2023.  Sonographer:    Tinnie Gosling RDCS Referring Phys: [phone] SUBRINA SUNDIL IMPRESSIONS  1. Left ventricular ejection fraction, by estimation, is 50 to 55%. The left ventricle has low normal function. The left ventricle has no regional wall motion abnormalities. Left ventricular diastolic parameters are consistent with Grade II diastolic dysfunction (pseudonormalization).  2. Right ventricular systolic function was not well visualized. The right ventricular size is not well visualized.  3. The mitral valve is degenerative. Moderate mitral valve regurgitation. No evidence of mitral stenosis.  4. The aortic valve is normal in structure. There is severe calcifcation of the aortic valve. There is severe thickening of the aortic valve. Aortic valve regurgitation is mild. Mild to moderate aortic valve stenosis. Aortic valve mean gradient measures  17.0 mmHg. Aortic valve Vmax measures 2.66 m/s.  5. The inferior vena cava is normal in size with greater than 50% respiratory variability, suggesting right atrial pressure of 3 mmHg. Conclusion(s)/Recommendation(s): Extremely limited study with poor visualization. If endocarditis is suspected a TEE would be beneficial. FINDINGS  Left Ventricle: Left ventricular ejection fraction, by estimation, is 50 to 55%. The left  ventricle has low normal function. The left ventricle has no regional wall motion abnormalities. The left ventricular internal cavity size was normal in size. There is no left ventricular hypertrophy. Left ventricular diastolic parameters are consistent with Grade II diastolic dysfunction (pseudonormalization).  LV Wall Scoring: Inferolateral wall hypokinesis. Right Ventricle: The right ventricular size is not well visualized. Right vetricular wall thickness was not well visualized. Right ventricular systolic function was not well visualized. Left Atrium: Left atrial size was normal in size. Right Atrium: Right atrial size was normal in size. Pericardium: There is no evidence of pericardial effusion. Mitral Valve: The mitral valve is degenerative in appearance. Mild mitral annular calcification. Moderate mitral valve regurgitation. No evidence of mitral valve stenosis. Tricuspid Valve: The tricuspid valve is normal  in structure. Tricuspid valve regurgitation is not demonstrated. No evidence of tricuspid stenosis. Aortic Valve: The aortic valve is normal in structure. There is severe calcifcation of the aortic valve. There is severe thickening of the aortic valve. Aortic valve regurgitation is mild. Mild to moderate aortic stenosis is present. Aortic valve mean gradient measures 17.0 mmHg. Aortic valve peak gradient measures 28.3 mmHg. Pulmonic Valve: The pulmonic valve was normal in structure. Pulmonic valve regurgitation is not visualized. No evidence of pulmonic stenosis. Aorta: The aortic root is normal in size and structure. Venous: The inferior vena cava is normal in size with greater than 50% respiratory variability, suggesting right atrial pressure of 3 mmHg. IAS/Shunts: No atrial level shunt detected by color flow Doppler.  LEFT VENTRICLE PLAX 2D LVIDd:         5.30 cm   Diastology LVIDs:         4.20 cm   LV e' medial:    6.20 cm/s LV PW:         1.10 cm   LV E/e' medial:  15.9 LV IVS:        1.10 cm   LV  e' lateral:   7.94 cm/s LVOT diam:     2.00 cm   LV E/e' lateral: 12.4 LVOT Area:     3.14 cm  LEFT ATRIUM         Index LA diam:    3.90 cm 1.91 cm/m  AORTIC VALVE AV Vmax:      266.00 cm/s AV Vmean:     182.000 cm/s AV VTI:       0.489 m AV Peak Grad: 28.3 mmHg AV Mean Grad: 17.0 mmHg  AORTA Ao Root diam: 2.40 cm Ao Asc diam:  3.30 cm MITRAL VALVE MV Area (PHT): 3.40 cm     SHUNTS MV Decel Time: 223 msec     Systemic Diam: 2.00 cm MV E velocity: 98.50 cm/s MV A velocity: 103.00 cm/s MV E/A ratio:  0.96 Kayla Schmitt Electronically signed by Kayla Schmitt Signature Date/Time: 10/21/2023/8:46:33 PM    Final    MR TIBIA FIBULA LEFT WO CONTRAST Result Date: 10/21/2023 CLINICAL DATA:  Left foot/leg pain blistering. Patient is status post bilateral transmetatarsal amputation. EXAM: MRI OF LOWER LEFT EXTREMITY WITHOUT CONTRAST TECHNIQUE: Multiplanar, multisequence MR imaging of the left tibia/fibula was performed. No intravenous contrast was administered. COMPARISON:  Left tibia/fibula radiographs dated 12/17/2021. FINDINGS: Bones/Joint/Cartilage No fracture or dislocation. Normal alignment. No joint effusion. No marrow signal abnormality. Partially visualized left knee joint effusion. Ligaments Grossly intact. Soft tissue/ Muscles and Tendons Diffuse T2/STIR hyperintense signal of the anterior and posterior compartment musculature of the left shin/calf, most compatible with edema. Perifascial edema extending along the deep intramuscular fascia of the anterior and posterior compartment musculature as well as the deep to the medial gastrocnemius muscle. There is relatively diffuse mild-to-moderate fatty atrophy of the anterior and posterior compartment musculature. Diffuse circumferential subcutaneous edema. No discrete loculated subcutaneous fluid collection. Blistering extending along the posteromedial ankle, measuring up to 4.9 x 1.3 cm in axial dimension. Limited evaluation of the right shin/calf on large  field-of-view coronal imaging demonstrates relatively diffuse intramuscular edema with less pronounced perifascial edema. IMPRESSION: 1. iffuse intramuscular edema of the anterior and posterior compartment musculature of the left shin/calf extending from the knee through the ankle with overlying perifascial edema along the deep intramuscular fascia as well as deep to the medial gastrocnemius muscle. There is overlying diffuse circumferential subcutaneous edema. No loculated subcutaneous  fluid collection. These findings are nonspecific. Cellulitis or fasciitis cannot be excluded, clinical correlation is recommended. Limited evaluation of the right shin/calf on large field-of-view coronal imaging demonstrates relatively diffuse intramuscular edema with less pronounced perifascial edema. 2. Blistering extending along the left posteromedial ankle. 3. Mild-to-moderate fatty atrophy of the left shin/calf anterior and posterior compartment musculature. Similar mild-to-moderate fatty atrophy of the right shin/calf is noted on large field-of-view coronal images. 4. No acute osseous abnormality. 5. Partially visualized left knee joint effusion. Electronically Signed   By: Harrietta Sherry M.D.   On: 10/21/2023 13:00    Labs:  Basic Metabolic Panel: Recent Labs  Lab 11/06/23 0246 11/07/23 0434 11/08/23 0410 11/09/23 0453 11/10/23 0305 11/11/23 0601 11/12/23 0405  NA 136 140 139 139 136 135 135  K 3.8 3.9 3.9 3.9 4.0 4.0 4.4  CL 103 106 102 101 100 100 100  CO2 26 25 28 27 26 26 23   GLUCOSE 150* 136* 132* 117* 149* 172* 181*  BUN 28* 29* 31* 35* 32* 32* 34*  CREATININE 1.14* 1.17* 1.14* 1.27* 1.33* 1.28* 1.28*  CALCIUM  8.0* 8.3* 8.6* 8.6* 8.6* 8.5* 8.7*    CBC: Recent Labs  Lab 11/08/23 0410 11/09/23 0453 11/10/23 0305 11/11/23 0601 11/12/23 0405  WBC 7.2   < > 10.3 9.7 11.4*  NEUTROABS 4.0  --  6.8  --  7.7  HGB 7.3*   < > 8.2* 7.7* 9.1*  HCT 23.4*   < > 26.4* 24.7* 29.5*  MCV 92.9   < >  90.4 91.5 91.9  PLT 301   < > 386 405* 496*   < > = values in this interval not displayed.    CBG: Recent Labs  Lab 11/11/23 0554 11/11/23 1116 11/11/23 1633 11/12/23 0530 11/12/23 1125  GLUCAP 172* 160* 152* 172* 157*   Family history.  Denies any colon cancer, esophageal cancer or rectal cancer   Brief HPI:   Kayla Schmitt is a 58 y.o. 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  She was found to have lactic acidosis with elevated troponin, anemia requiring 1 unit packed red blood cells and no clear source of infection found.  She was transferred to [facility] for management MRI of the left lower extremity showed diffuse edema anterior and posterior compartment of the left shin/calf extending from knee through ankle as well as deep intramuscular fascia as well as deep to medial gastroc muscle question cellulitis or fasciitis and blistering extending along the left posterior medial ankle.  ID recommended continuing broad-spectrum antibiotics through surgery.  Exam concerning for necrotizing fasciitis and she underwent left BKA with open flab wrap with wound VAC on 6/24 per Dr. Silver followed by revision BKA on 6/25 by Dr. Harden. TEE 6/26 revealing normal LVEF 60-65% which was negative for thrombus or endocarditis, negative for shunting, showed moderate to severe aortic stenosis with mild to moderate AR, mild to moderate MR and mild layered plaque involving descending aorta.  Antibiotics narrowed to IV daptomycin  through  11/17/2023 4 weeks from negative blood  culture 6/22.  Mentation improved with improvement in pain but continued to have phantom pain also PT/OT consulted and working with patient who required moderate assist x 2 for standing pivot transfers and plus to moderate assist for ADLs.  She lives alone in [place], was independent working prior to admission.  CR recommended due to functional decline.   Hospital Course: Kayla Schmitt was admitted to rehab 10/29/2023 for inpatient therapies to consist of PT, ST and OT at least three hours five days a week. Past admission physiatrist, therapy team and rehab RN have worked together to provide customized collaborative inpatient rehab.  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  Wound VAC initially placed and since discontinued with dressing changes as directed and follow-up orthopedic services.  Lovenox  for DVT prophylaxis continued on Plavix  for history of PAD with stents.  Right lower extremity venous Doppler study - 11/08/2023.  Pain management ongoing with the use of gabapentin  titrated as needed for phantom pain restless leg syndrome.  MRSA bacteremia continuing daptomycin  through 11/17/2023 follow-up infectious disease.  Blood sugars overall monitored hemoglobin A1c 5.4 well-controlled continued on insulin  therapy as directed with full diabetic teaching.  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  Followed by cardiology services for CAD/diastolic congestive heart failure ejection fraction 60 to 65% with history of severe AAS moderate MR.  Patient with episodes of shortness of breath monitoring use of diuresis as well as close monitoring of renal function with latest creatinine 1.33.  Cardiology to discuss possible need for CABG/MVR in the near future.  Patient with decreased nutritional storage dietary follow-up.  Hospital course complicated by  decreased oxygen  saturations fluid overload followed by cardiology services and due to multiple medical changes patient was discharged to acute care services for ongoing care   Blood pressures were monitored on TID basis and remained soft and monitored  Diabetes has been monitored with ac/hs CBG checks and SSI was use prn for tighter BS control.    Rehab course: During patient's stay in rehab weekly team conferences were held to monitor patient's progress, set goals and discuss barriers to discharge. At admission, patient required mod assist sit to stand +2 physical assist stand pivot transfers  He/She  has had improvement in activity tolerance, balance, postural control as well as ability to compensate for deficits. He/She has had improvement in functional use RUE/LUE  and RLE/LLE as well as improvement in awareness       Disposition:  Discharge disposition: [facility] Not Defined        Diet: Heart healthy/carb modified  Special Instructions: No driving smoking or alcohol  Medications at discharge 1.  Tylenol  as needed 2.  Vitamin C  250 mg p.o. daily 3.  Lipitor 40 mg p.o. daily 4.  Vitamin D  1000 units p.o. daily 5.  Plavix  75 mg p.o. daily 6.  Intravenous Unasyn  3 g every 6 hours 7.  Ferrous sulfate  325 mg p.o. daily 8.  Neurontin  200 mg p.o. 3 times daily 9.  Lantus  insulin  10 units subcutaneous at bedtime 10.  Synthroid  150 mg p.o. daily before breakfast 11.  Melatonin 5 mg p.o. nightly as needed sleep 12.  Robaxin  500 mg 4 times daily as needed muscle spasms 13.  Juven nutritional supplement 1 packet twice daily 14.  Percocet 5-325 mg 1 to 2 tablets every 4 hours as needed pain 15.  Protonix  40 mg p.o. twice daily 16.  MiraLAX  daily hold for loose stools 17.  Zinc  sulfate 220 mg p.o. daily  30-35 minutes are spent completing discharge summary and discharge planning     Follow-up Information     [name], NP Follow up.    Specialty: Nurse Practitioner Why: Call in 1-2 days for post hospital follow up Contact information: [address] [place] [address] [phone]         Lorilee Sven SQUIBB, MD Follow up.   Specialty: Physical Medicine and Rehabilitation Why: office will call you with follow up appointment Contact information: [address] [place] [address] [phone]         Harden Jerona GAILS, MD Follow up.   Specialty: Orthopedic Surgery Why: Call in 1-2 days for post hospital follow up Contact information: [address] [place]  [place] [address] [phone]         Lanis Fonda BRAVO, MD Follow up.   Specialty: Vascular Surgery Why: Call in 1-2 days for post hospital follow up Contact information: [address] [name] [address] [phone]         Dea Shiner, MD Follow up.   Specialty: Infectious Diseases Why: Follow-up 7/17 at 9:30 AM Contact information: [address] [place] [address] [phone]                 Signed: Toribio PARAS Omari Koslosky 11/12/2023, 3:30 PM

## 2023-11-11 NOTE — Progress Notes (Addendum)
 Inpatient Rehabilitation Discharge Medication Review by a Pharmacist   A complete drug regimen review was completed for this patient to identify any potential clinically significant medication issues.   High Risk Drug Classes Is patient taking? Indication by Medication  Antipsychotic No   Anticoagulant No    Antibiotic Yes, as an intravenous medication Daptomycin - MRSA bacteremia (thru 11/17/23)  Opioid Yes Percocet- pain   Antiplatelet Yes Plavix  - PAD   Hypoglycemics/insulin  Yes Insulin - DM   Vasoactive Medication No    Chemotherapy No    Other Yes Acetaminophen - pain   Atorvastatin - HLD   Gabapentin - neuropathy  Ferrous sulfate - supplement  Melatonin - sleep Miralax  - constipation  Pantoprazole - reflux  Robaxin - muscle spasms  Synthroid - hypothyroidism  Vitamin C , Vitamin D , Zinc - supplements Juven- nutrititional supplement         Type of Medication Issue Identified Description of Issue Recommendation(s)  Drug Interaction(s) (clinically significant)        Duplicate Therapy        Allergy        No Medication Administration End Date        Incorrect Dose        Additional Drug Therapy Needed        Significant med changes from prior encounter (inform family/care partners about these prior to discharge). PTA meds not resumed- Lasix  , toprol  nitroglycerin   Communicate relevant medication changes to patient/family members at discharge from CIR.    Restart or discontinue PTA meds not resumed in CIR at discharge if clinically indicated.   Other            Clinically significant medication issues were identified that warrant physician communication and completion of prescribed/recommended actions by midnight of the next day:  No   Name of provider notified for urgent issues identified:    Provider Method of Notification:      Pharmacist comments:    Time spent performing this drug regimen review (minutes): 20   Levorn Gaskins, [place] Clinical Pharmacist 11/11/2023  12:04 PM

## 2023-11-11 NOTE — Progress Notes (Signed)
 Occupational Therapy Session Note  Patient Details  Name: Kayla Schmitt MRN: [id number] Date of Birth: May 21, 1965  Today's Date: 11/11/2023 OT Individual Time: 8699-8676 OT Individual Time Calculation (min): 23 min  and Today's Date: 11/11/2023 OT Missed Time: 52 Minutes Missed Time Reason: Patient fatigue   Short Term Goals: Week 2:  OT Short Term Goal 1 (Week 2): STG = LTG due to ELOS  Skilled Therapeutic Interventions/Progress Updates:  Skilled OT intervention completed with focus on pt and family education. Pt received seated on BSC, informing OT of continued SOB and feeling unable to do today's therapy. No pain reported.  Pt and mother both expressed concern regarding her medical status and feeling unable to go home. Support offered. Education provided on differences between discomfort with SOB and actual O2 dependence as pt's saturation WNL. We discussed energy conservation strategies, and considerations regarding pt's history with needing a valve repair etc. Pt did disclose that when she feels unable to breathe or SOB from exertion, she has developed anxiety regarding the situation and feels that I makes it worse. Suggested she try relaxation breathing techniques but did notify care team for consult with neuro psych as pt doesn't desire additional medications.  Pt remained seated on BSC with mom present cleared for transfers, with all needs in reach at end of session. Pt missed 52 mins of OT intervention secondary to fatigue and polite refusal due to SOB; OT will make up missed time as able.    Therapy Documentation Precautions:  Precautions Precautions: Fall Precaution/Restrictions Comments: R transmet amputation Restrictions Weight Bearing Restrictions Per Provider Order: Yes LLE Weight Bearing Per Provider Order: Non weight bearing    Therapy/Group: Individual Therapy  Lorrayne FORBES Fritter, MS, OTR/L  11/11/2023, 1:58 PM

## 2023-11-11 NOTE — Progress Notes (Signed)
 Physical Therapy Session Note  Patient Details  Name: Kayla Schmitt MRN: [id number] Date of Birth: 13-Jun-1965  Today's Date: 11/11/2023 PT Individual Time: 0807-0905 PT Individual Time Calculation (min): 58 min   Short Term Goals: Week 1:  PT Short Term Goal 1 (Week 1): pt will transfer bed<>chair with LRAD and CGA PT Short Term Goal 1 - Progress (Week 1): Met PT Short Term Goal 2 (Week 1): pt will transfer sit<>stand with LRAD and CGA PT Short Term Goal 2 - Progress (Week 1): Progressing toward goal PT Short Term Goal 3 (Week 1): pt will perform simulated car transfer with LRAD and CGA PT Short Term Goal 3 - Progress (Week 1): Met Week 2:  PT Short Term Goal 1 (Week 2): STG=LTG due to LOS  Skilled Therapeutic Interventions/Progress Updates:  Patient seated upright in w/c on entrance to room. Patient alert and agreeable to PT session. Meg from lead OT re: pt's difficult weekend and postponement of d/c. Need for monitoring today.   Patient with no pain complaint at start of session.  Does related difficulty breathing yesterday and need for use of 1L O2 overnight. SpO2 this morning while seated in w/c is 94% on RA with pulse at 115.    Shoe donned to R foot with MaxA.   Pt self propels to main therapy gym >130 ft with supervision/ Mod I. No cues required. Is able to position at mat table. Transfers to mat table with supervision. While seated, guided in scapular pushups with use of yoga blocks. Guided in technique to improve scapular depression with body weight. Performs well. Then guided in overhead pushups against resistance with focus on scapular elevation using T[place]. One seated rest break between sets.   Transport over to parallel bars dependently and pt is able to rise to stand with pull-to-stand technique with supervision. Stands and sets self up with holding arms still and demos improvement in scapular strength with ability to lift foot from floor. Lifts foot quickly and  returns with good hold to body on bars x3. Provided with MinA at greater trochanters as needed.   Patient seated in w/c in room at end of session with brakes locked, no alarm set, and all needs within reach.  Therapy Documentation Precautions:  Precautions Precautions: Fall Precaution/Restrictions Comments: R transmet amputation Restrictions Weight Bearing Restrictions Per Provider Order: Yes LLE Weight Bearing Per Provider Order: Non weight bearing  Pain: Pain Assessment Pain Scale: 0-10 Pain Score: 0-No pain   Therapy/Group: Individual Therapy  Mliss DELENA Milliner PT, DPT, CSRS 11/11/2023, 12:48 PM

## 2023-11-11 NOTE — Progress Notes (Signed)
 Physical Therapy Session Note  Patient Details  Name: Kayla Schmitt MRN: [id number] Date of Birth: [birth year]  Today's Date: 11/11/2023 PT Individual Time: 9054-8957 PT Individual Time Calculation (min): 57 min   Short Term Goals: Week 1:  PT Short Term Goal 1 (Week 1): pt will transfer bed<>chair with LRAD and CGA PT Short Term Goal 1 - Progress (Week 1): Met PT Short Term Goal 2 (Week 1): pt will transfer sit<>stand with LRAD and CGA PT Short Term Goal 2 - Progress (Week 1): Progressing toward goal PT Short Term Goal 3 (Week 1): pt will perform simulated car transfer with LRAD and CGA PT Short Term Goal 3 - Progress (Week 1): Met Week 2:  PT Short Term Goal 1 (Week 2): STG=LTG due to LOS  Skilled Therapeutic Interventions/Progress Updates:    Pt presents on [facility] - supervision to transfer lateral scoot off to w/c. Pt already had performed hygiene and clothing management independently. Discussed overall activity tolerance today and pt goals in sessions. Prefers not to work on standing this session. Focused on general strengthening and cardiovascular endurance with nustep x 10 min on level 1 with several rest breaks every 1-2  min. HR = 110-111 bpm and O2 = 92-93% with increased effort of breathing noted with activity. Supervision tp CGA transfers on and off Nustep <> w/c with assist for set up for energy conservation. Pt transported back to room for energy conservation. Requests to don oxygen  at 1L for support (vitals still > 90%). Performed seated therex for functional strengthening to increase mobility with overall flexibility/ROM for BLE including LAQ, isometric hip abduction x 10 sec hold, resistive hip adduction x 10 sec hold, and marches x 10 reps completed x 2 set of 10 reps each BLE. Reports improved work of breathing with supplemental O2 during this part of session. Notified nursing and MD as well.   Therapy Documentation Precautions:  Precautions Precautions:  Fall Precaution/Restrictions Comments: R transmet amputation Restrictions Weight Bearing Restrictions Per Provider Order: Yes LLE Weight Bearing Per Provider Order: Non weight bearing    Pain: Denies pain.     Therapy/Group: Individual Therapy  Elnor Pizza Sherrell Pizza WENDI Elnor, PT, DPT, CBIS  11/11/2023, 10:45 AM

## 2023-11-12 ENCOUNTER — Inpatient Hospital Stay (HOSPITAL_COMMUNITY)
Admission: EM | Admit: 2023-11-12 | Discharge: 2023-12-06 | DRG: 853 | Disposition: A | Discharge status: Home Health | Source: Skilled Nursing Facility | Attending: Internal Medicine | Admitting: Internal Medicine

## 2023-11-12 ENCOUNTER — Telehealth: Payer: Self-pay | Admitting: Radiology

## 2023-11-12 ENCOUNTER — Encounter (HOSPITAL_COMMUNITY): Payer: Self-pay

## 2023-11-12 DIAGNOSIS — R6521 Severe sepsis with septic shock: Secondary | ICD-10-CM | POA: Diagnosis present

## 2023-11-12 DIAGNOSIS — I5043 Acute on chronic combined systolic (congestive) and diastolic (congestive) heart failure: Secondary | ICD-10-CM | POA: Diagnosis present

## 2023-11-12 DIAGNOSIS — Z89512 Acquired absence of left leg below knee: Secondary | ICD-10-CM | POA: Diagnosis not present

## 2023-11-12 DIAGNOSIS — I252 Old myocardial infarction: Secondary | ICD-10-CM | POA: Diagnosis not present

## 2023-11-12 DIAGNOSIS — I2721 Secondary pulmonary arterial hypertension: Secondary | ICD-10-CM | POA: Diagnosis present

## 2023-11-12 DIAGNOSIS — J9622 Acute and chronic respiratory failure with hypercapnia: Secondary | ICD-10-CM | POA: Diagnosis present

## 2023-11-12 DIAGNOSIS — N390 Urinary tract infection, site not specified: Secondary | ICD-10-CM | POA: Diagnosis present

## 2023-11-12 DIAGNOSIS — Z515 Encounter for palliative care: Secondary | ICD-10-CM

## 2023-11-12 DIAGNOSIS — I5023 Acute on chronic systolic (congestive) heart failure: Secondary | ICD-10-CM | POA: Diagnosis not present

## 2023-11-12 DIAGNOSIS — D631 Anemia in chronic kidney disease: Secondary | ICD-10-CM | POA: Diagnosis present

## 2023-11-12 DIAGNOSIS — E876 Hypokalemia: Secondary | ICD-10-CM | POA: Diagnosis present

## 2023-11-12 DIAGNOSIS — D62 Acute posthemorrhagic anemia: Secondary | ICD-10-CM | POA: Diagnosis not present

## 2023-11-12 DIAGNOSIS — E871 Hypo-osmolality and hyponatremia: Secondary | ICD-10-CM | POA: Diagnosis present

## 2023-11-12 DIAGNOSIS — R7989 Other specified abnormal findings of blood chemistry: Secondary | ICD-10-CM | POA: Diagnosis not present

## 2023-11-12 DIAGNOSIS — Z8614 Personal history of Methicillin resistant Staphylococcus aureus infection: Secondary | ICD-10-CM

## 2023-11-12 DIAGNOSIS — R310 Gross hematuria: Secondary | ICD-10-CM

## 2023-11-12 DIAGNOSIS — E1151 Type 2 diabetes mellitus with diabetic peripheral angiopathy without gangrene: Secondary | ICD-10-CM | POA: Diagnosis present

## 2023-11-12 DIAGNOSIS — Z89612 Acquired absence of left leg above knee: Secondary | ICD-10-CM

## 2023-11-12 DIAGNOSIS — N1831 Chronic kidney disease, stage 3a: Secondary | ICD-10-CM | POA: Diagnosis present

## 2023-11-12 DIAGNOSIS — R7881 Bacteremia: Secondary | ICD-10-CM | POA: Diagnosis present

## 2023-11-12 DIAGNOSIS — I10 Essential (primary) hypertension: Secondary | ICD-10-CM | POA: Diagnosis present

## 2023-11-12 DIAGNOSIS — I2510 Atherosclerotic heart disease of native coronary artery without angina pectoris: Secondary | ICD-10-CM | POA: Diagnosis not present

## 2023-11-12 DIAGNOSIS — E039 Hypothyroidism, unspecified: Secondary | ICD-10-CM | POA: Diagnosis present

## 2023-11-12 DIAGNOSIS — E1165 Type 2 diabetes mellitus with hyperglycemia: Secondary | ICD-10-CM | POA: Diagnosis present

## 2023-11-12 DIAGNOSIS — J9601 Acute respiratory failure with hypoxia: Secondary | ICD-10-CM

## 2023-11-12 DIAGNOSIS — Z79899 Other long term (current) drug therapy: Secondary | ICD-10-CM

## 2023-11-12 DIAGNOSIS — R339 Retention of urine, unspecified: Secondary | ICD-10-CM | POA: Diagnosis not present

## 2023-11-12 DIAGNOSIS — R17 Unspecified jaundice: Secondary | ICD-10-CM | POA: Diagnosis not present

## 2023-11-12 DIAGNOSIS — D6489 Other specified anemias: Secondary | ICD-10-CM | POA: Diagnosis present

## 2023-11-12 DIAGNOSIS — Z87891 Personal history of nicotine dependence: Secondary | ICD-10-CM

## 2023-11-12 DIAGNOSIS — N179 Acute kidney failure, unspecified: Secondary | ICD-10-CM | POA: Diagnosis present

## 2023-11-12 DIAGNOSIS — E43 Unspecified severe protein-calorie malnutrition: Secondary | ICD-10-CM | POA: Diagnosis not present

## 2023-11-12 DIAGNOSIS — I25118 Atherosclerotic heart disease of native coronary artery with other forms of angina pectoris: Secondary | ICD-10-CM | POA: Diagnosis not present

## 2023-11-12 DIAGNOSIS — E66813 Obesity, class 3: Secondary | ICD-10-CM | POA: Diagnosis present

## 2023-11-12 DIAGNOSIS — I350 Nonrheumatic aortic (valve) stenosis: Secondary | ICD-10-CM | POA: Diagnosis present

## 2023-11-12 DIAGNOSIS — E119 Type 2 diabetes mellitus without complications: Secondary | ICD-10-CM | POA: Diagnosis not present

## 2023-11-12 DIAGNOSIS — I061 Rheumatic aortic insufficiency: Secondary | ICD-10-CM | POA: Diagnosis not present

## 2023-11-12 DIAGNOSIS — J690 Pneumonitis due to inhalation of food and vomit: Secondary | ICD-10-CM | POA: Diagnosis present

## 2023-11-12 DIAGNOSIS — R338 Other retention of urine: Secondary | ICD-10-CM | POA: Diagnosis not present

## 2023-11-12 DIAGNOSIS — I5021 Acute systolic (congestive) heart failure: Secondary | ICD-10-CM | POA: Diagnosis not present

## 2023-11-12 DIAGNOSIS — T83031A Leakage of indwelling urethral catheter, initial encounter: Secondary | ICD-10-CM | POA: Diagnosis not present

## 2023-11-12 DIAGNOSIS — L97511 Non-pressure chronic ulcer of other part of right foot limited to breakdown of skin: Secondary | ICD-10-CM | POA: Diagnosis present

## 2023-11-12 DIAGNOSIS — I255 Ischemic cardiomyopathy: Secondary | ICD-10-CM | POA: Diagnosis present

## 2023-11-12 DIAGNOSIS — I509 Heart failure, unspecified: Secondary | ICD-10-CM | POA: Diagnosis not present

## 2023-11-12 DIAGNOSIS — J189 Pneumonia, unspecified organism: Secondary | ICD-10-CM | POA: Diagnosis not present

## 2023-11-12 DIAGNOSIS — E118 Type 2 diabetes mellitus with unspecified complications: Secondary | ICD-10-CM

## 2023-11-12 DIAGNOSIS — Z79890 Hormone replacement therapy (postmenopausal): Secondary | ICD-10-CM

## 2023-11-12 DIAGNOSIS — E7800 Pure hypercholesterolemia, unspecified: Secondary | ICD-10-CM | POA: Diagnosis present

## 2023-11-12 DIAGNOSIS — I051 Rheumatic mitral insufficiency: Secondary | ICD-10-CM | POA: Diagnosis not present

## 2023-11-12 DIAGNOSIS — I5031 Acute diastolic (congestive) heart failure: Secondary | ICD-10-CM | POA: Diagnosis not present

## 2023-11-12 DIAGNOSIS — K219 Gastro-esophageal reflux disease without esophagitis: Secondary | ICD-10-CM | POA: Diagnosis present

## 2023-11-12 DIAGNOSIS — Z7982 Long term (current) use of aspirin: Secondary | ICD-10-CM

## 2023-11-12 DIAGNOSIS — D509 Iron deficiency anemia, unspecified: Secondary | ICD-10-CM | POA: Diagnosis not present

## 2023-11-12 DIAGNOSIS — I340 Nonrheumatic mitral (valve) insufficiency: Secondary | ICD-10-CM | POA: Diagnosis present

## 2023-11-12 DIAGNOSIS — F411 Generalized anxiety disorder: Secondary | ICD-10-CM | POA: Diagnosis present

## 2023-11-12 DIAGNOSIS — Z7189 Other specified counseling: Secondary | ICD-10-CM

## 2023-11-12 DIAGNOSIS — Z794 Long term (current) use of insulin: Secondary | ICD-10-CM

## 2023-11-12 DIAGNOSIS — E8770 Fluid overload, unspecified: Secondary | ICD-10-CM | POA: Diagnosis present

## 2023-11-12 DIAGNOSIS — R570 Cardiogenic shock: Secondary | ICD-10-CM | POA: Diagnosis present

## 2023-11-12 DIAGNOSIS — N3289 Other specified disorders of bladder: Secondary | ICD-10-CM | POA: Diagnosis not present

## 2023-11-12 DIAGNOSIS — I130 Hypertensive heart and chronic kidney disease with heart failure and stage 1 through stage 4 chronic kidney disease, or unspecified chronic kidney disease: Secondary | ICD-10-CM | POA: Diagnosis present

## 2023-11-12 DIAGNOSIS — E8809 Other disorders of plasma-protein metabolism, not elsewhere classified: Secondary | ICD-10-CM | POA: Diagnosis present

## 2023-11-12 DIAGNOSIS — K5520 Angiodysplasia of colon without hemorrhage: Secondary | ICD-10-CM | POA: Diagnosis present

## 2023-11-12 DIAGNOSIS — Z89431 Acquired absence of right foot: Secondary | ICD-10-CM

## 2023-11-12 DIAGNOSIS — B9562 Methicillin resistant Staphylococcus aureus infection as the cause of diseases classified elsewhere: Secondary | ICD-10-CM | POA: Diagnosis present

## 2023-11-12 DIAGNOSIS — J9621 Acute and chronic respiratory failure with hypoxia: Secondary | ICD-10-CM | POA: Diagnosis not present

## 2023-11-12 DIAGNOSIS — D649 Anemia, unspecified: Secondary | ICD-10-CM | POA: Diagnosis not present

## 2023-11-12 DIAGNOSIS — E1159 Type 2 diabetes mellitus with other circulatory complications: Secondary | ICD-10-CM | POA: Diagnosis not present

## 2023-11-12 DIAGNOSIS — Z4781 Encounter for orthopedic aftercare following surgical amputation: Secondary | ICD-10-CM | POA: Diagnosis not present

## 2023-11-12 DIAGNOSIS — I5033 Acute on chronic diastolic (congestive) heart failure: Secondary | ICD-10-CM | POA: Diagnosis not present

## 2023-11-12 DIAGNOSIS — N189 Chronic kidney disease, unspecified: Secondary | ICD-10-CM | POA: Diagnosis not present

## 2023-11-12 DIAGNOSIS — E1140 Type 2 diabetes mellitus with diabetic neuropathy, unspecified: Secondary | ICD-10-CM | POA: Diagnosis present

## 2023-11-12 DIAGNOSIS — Y738 Miscellaneous gastroenterology and urology devices associated with adverse incidents, not elsewhere classified: Secondary | ICD-10-CM | POA: Diagnosis not present

## 2023-11-12 DIAGNOSIS — Z7902 Long term (current) use of antithrombotics/antiplatelets: Secondary | ICD-10-CM

## 2023-11-12 DIAGNOSIS — A419 Sepsis, unspecified organism: Secondary | ICD-10-CM | POA: Diagnosis present

## 2023-11-12 DIAGNOSIS — M545 Low back pain, unspecified: Secondary | ICD-10-CM | POA: Diagnosis not present

## 2023-11-12 DIAGNOSIS — J810 Acute pulmonary edema: Secondary | ICD-10-CM | POA: Diagnosis not present

## 2023-11-12 DIAGNOSIS — E1169 Type 2 diabetes mellitus with other specified complication: Secondary | ICD-10-CM | POA: Diagnosis present

## 2023-11-12 DIAGNOSIS — I70223 Atherosclerosis of native arteries of extremities with rest pain, bilateral legs: Secondary | ICD-10-CM | POA: Diagnosis present

## 2023-11-12 DIAGNOSIS — Z8249 Family history of ischemic heart disease and other diseases of the circulatory system: Secondary | ICD-10-CM

## 2023-11-12 DIAGNOSIS — M726 Necrotizing fasciitis: Secondary | ICD-10-CM | POA: Diagnosis present

## 2023-11-12 DIAGNOSIS — Z538 Procedure and treatment not carried out for other reasons: Secondary | ICD-10-CM | POA: Diagnosis not present

## 2023-11-12 DIAGNOSIS — E1122 Type 2 diabetes mellitus with diabetic chronic kidney disease: Secondary | ICD-10-CM | POA: Diagnosis present

## 2023-11-12 DIAGNOSIS — J9602 Acute respiratory failure with hypercapnia: Secondary | ICD-10-CM | POA: Diagnosis not present

## 2023-11-12 DIAGNOSIS — Z6835 Body mass index (BMI) 35.0-35.9, adult: Secondary | ICD-10-CM

## 2023-11-12 DIAGNOSIS — Z833 Family history of diabetes mellitus: Secondary | ICD-10-CM

## 2023-11-12 DIAGNOSIS — E785 Hyperlipidemia, unspecified: Secondary | ICD-10-CM | POA: Diagnosis not present

## 2023-11-12 DIAGNOSIS — R319 Hematuria, unspecified: Secondary | ICD-10-CM | POA: Diagnosis not present

## 2023-11-12 DIAGNOSIS — N1832 Chronic kidney disease, stage 3b: Secondary | ICD-10-CM

## 2023-11-12 LAB — GLUCOSE, CAPILLARY
Glucose-Capillary: 172 mg/dL — ABNORMAL HIGH (ref 70–99)
Glucose-Capillary: 157 mg/dL — ABNORMAL HIGH (ref 70–99)
Glucose-Capillary: 187 mg/dL — ABNORMAL HIGH (ref 70–99)

## 2023-11-12 LAB — CBC WITH DIFFERENTIAL/PLATELET
Abs Immature Granulocytes: 0.06 K/uL (ref 0.00–0.07)
Basophils Absolute: 0.1 K/uL (ref 0.0–0.1)
Basophils Relative: 1 %
Eosinophils Absolute: 0.9 K/uL — ABNORMAL HIGH (ref 0.0–0.5)
Eosinophils Relative: 8 %
HCT: 29.5 % — ABNORMAL LOW (ref 36.0–46.0)
Hemoglobin: 9.1 g/dL — ABNORMAL LOW (ref 12.0–15.0)
Immature Granulocytes: 1 %
Lymphocytes Relative: 13 %
Lymphs Abs: 1.4 K/uL (ref 0.7–4.0)
MCH: 28.3 pg (ref 26.0–34.0)
MCHC: 30.8 g/dL (ref 30.0–36.0)
MCV: 91.9 fL (ref 80.0–100.0)
Monocytes Absolute: 1.2 K/uL — ABNORMAL HIGH (ref 0.1–1.0)
Monocytes Relative: 11 %
Neutro Abs: 7.7 K/uL (ref 1.7–7.7)
Neutrophils Relative %: 66 %
Platelets: 496 K/uL — ABNORMAL HIGH (ref 150–400)
RBC: 3.21 MIL/uL — ABNORMAL LOW (ref 3.87–5.11)
RDW: 17.3 % — ABNORMAL HIGH (ref 11.5–15.5)
WBC: 11.4 K/uL — ABNORMAL HIGH (ref 4.0–10.5)
nRBC: 0 % (ref 0.0–0.2)

## 2023-11-12 LAB — TYPE AND SCREEN
ABO/RH(D): O POS
Antibody Screen: NEGATIVE
Unit division: 0

## 2023-11-12 LAB — BASIC METABOLIC PANEL WITH GFR
Anion gap: 12 (ref 5–15)
BUN: 34 mg/dL — ABNORMAL HIGH (ref 6–20)
CO2: 23 mmol/L (ref 22–32)
Calcium: 8.7 mg/dL — ABNORMAL LOW (ref 8.9–10.3)
Chloride: 100 mmol/L (ref 98–111)
Creatinine, Ser: 1.28 mg/dL — ABNORMAL HIGH (ref 0.44–1.00)
GFR, Estimated: 49 mL/min — ABNORMAL LOW (ref >60)
Glucose, Bld: 181 mg/dL — ABNORMAL HIGH (ref 70–99)
Potassium: 4.4 mmol/L (ref 3.5–5.1)
Sodium: 135 mmol/L (ref 135–145)

## 2023-11-12 LAB — IRON AND TIBC
Iron: 18 ug/dL — ABNORMAL LOW (ref 28–170)
Saturation Ratios: 11 % (ref 10.4–31.8)
TIBC: 162 ug/dL — ABNORMAL LOW (ref 250–450)
UIBC: 144 ug/dL

## 2023-11-12 LAB — FIBRINOGEN: Fibrinogen: >800 mg/dL — ABNORMAL HIGH (ref 210–475)

## 2023-11-12 LAB — HEPATIC FUNCTION PANEL
ALT: 19 U/L (ref 0–44)
AST: 24 U/L (ref 15–41)
Albumin: 2.2 g/dL — ABNORMAL LOW (ref 3.5–5.0)
Alkaline Phosphatase: 115 U/L (ref 38–126)
Bilirubin, Direct: 0.2 mg/dL (ref 0.0–0.2)
Indirect Bilirubin: 0.7 mg/dL (ref 0.3–0.9)
Total Bilirubin: 0.9 mg/dL (ref 0.0–1.2)
Total Protein: 8 g/dL (ref 6.5–8.1)

## 2023-11-12 LAB — RETICULOCYTES
Immature Retic Fract: 31.7 % — ABNORMAL HIGH (ref 2.3–15.9)
RBC.: 3.43 MIL/uL — ABNORMAL LOW (ref 3.87–5.11)
Retic Count, Absolute: 89.2 K/uL (ref 19.0–186.0)
Retic Ct Pct: 2.6 % (ref 0.4–3.1)

## 2023-11-12 LAB — PROTIME-INR
INR: 1.3 — ABNORMAL HIGH (ref 0.8–1.2)
Prothrombin Time: 16.6 s — ABNORMAL HIGH (ref 11.4–15.2)

## 2023-11-12 LAB — TROPONIN I (HIGH SENSITIVITY)
Troponin I (High Sensitivity): 489 ng/L (ref <18)
Troponin I (High Sensitivity): 554 ng/L (ref <18)

## 2023-11-12 LAB — BPAM RBC
Blood Product Expiration Date: 202508112359
ISSUE DATE / TIME: 202507141515
Unit Type and Rh: 5100

## 2023-11-12 LAB — FERRITIN: Ferritin: 989 ng/mL — ABNORMAL HIGH (ref 11–307)

## 2023-11-12 LAB — FOLATE: Folate: 9.6 ng/mL (ref >5.9)

## 2023-11-12 LAB — LACTIC ACID, PLASMA: Lactic Acid, Venous: 1.3 mmol/L (ref 0.5–1.9)

## 2023-11-12 LAB — PROCALCITONIN: Procalcitonin: 0.27 ng/mL

## 2023-11-12 LAB — VITAMIN B12: Vitamin B-12: 658 pg/mL (ref 180–914)

## 2023-11-12 MED ORDER — ACETAMINOPHEN 325 MG PO TABS
650.0000 mg | ORAL_TABLET | Freq: Four times a day (QID) | ORAL | Status: DC | PRN
  Administered 2023-11-13 – 2023-11-26 (×9): 650 mg via ORAL
  Filled 2023-11-12 (×9): qty 2

## 2023-11-12 MED ORDER — CLOPIDOGREL BISULFATE 75 MG PO TABS: 75.0000 mg | ORAL_TABLET | Freq: Every day | ORAL | Status: DC

## 2023-11-12 MED ORDER — PANTOPRAZOLE SODIUM 40 MG PO TBEC
40.0000 mg | DELAYED_RELEASE_TABLET | Freq: Every day | ORAL | Status: DC
  Administered 2023-11-13 – 2023-11-14 (×2): 40 mg via ORAL
  Filled 2023-11-12 (×2): qty 1

## 2023-11-12 MED ORDER — GABAPENTIN 300 MG PO CAPS
300.0000 mg | ORAL_CAPSULE | Freq: Every day | ORAL | Status: DC
  Administered 2023-11-13 – 2023-11-14 (×2): 300 mg via ORAL
  Filled 2023-11-12 (×2): qty 1

## 2023-11-12 MED ORDER — SODIUM CHLORIDE 0.9 % IV SOLN
3.0000 g | Freq: Four times a day (QID) | INTRAVENOUS | Status: DC
  Administered 2023-11-12 – 2023-11-15 (×11): 3 g via INTRAVENOUS
  Filled 2023-11-12 (×11): qty 8

## 2023-11-12 MED ORDER — METOPROLOL TARTRATE 12.5 MG HALF TABLET
12.5000 mg | ORAL_TABLET | Freq: Two times a day (BID) | ORAL | Status: DC
  Administered 2023-11-12 – 2023-11-13 (×2): 12.5 mg via ORAL
  Filled 2023-11-12 (×2): qty 1

## 2023-11-12 MED ORDER — SODIUM CHLORIDE 0.9 % IV SOLN: 3.0000 g | Freq: Four times a day (QID) | INTRAVENOUS | Status: DC

## 2023-11-12 MED ORDER — DAPTOMYCIN-SODIUM CHLORIDE 700-0.9 MG/100ML-% IV SOLN
700.0000 mg | Freq: Every day | INTRAVENOUS | Status: DC
  Administered 2023-11-13 – 2023-11-15 (×3): 700 mg via INTRAVENOUS
  Filled 2023-11-12 (×4): qty 100

## 2023-11-12 MED ORDER — ALBUMIN HUMAN 25 % IV SOLN
12.5000 g | Freq: Once | INTRAVENOUS | Status: AC
  Administered 2023-11-12: 12.5 g via INTRAVENOUS
  Filled 2023-11-12: qty 50

## 2023-11-12 MED ORDER — ASPIRIN 81 MG PO TBEC
81.0000 mg | DELAYED_RELEASE_TABLET | Freq: Every day | ORAL | Status: DC
  Administered 2023-11-13 – 2023-11-14 (×2): 81 mg via ORAL
  Filled 2023-11-12 (×2): qty 1

## 2023-11-12 MED ORDER — FUROSEMIDE 10 MG/ML IJ SOLN
40.0000 mg | Freq: Two times a day (BID) | INTRAMUSCULAR | Status: DC
  Administered 2023-11-12: 40 mg via INTRAVENOUS
  Filled 2023-11-12: qty 4

## 2023-11-12 MED ORDER — LEVOTHYROXINE SODIUM 75 MCG PO TABS
150.0000 ug | ORAL_TABLET | Freq: Every day | ORAL | Status: DC
  Administered 2023-11-13 – 2023-11-15 (×3): 150 ug via ORAL
  Filled 2023-11-12 (×3): qty 2

## 2023-11-12 MED ORDER — PROCHLORPERAZINE EDISYLATE 10 MG/2ML IJ SOLN
10.0000 mg | Freq: Four times a day (QID) | INTRAMUSCULAR | Status: DC | PRN
  Administered 2023-11-16 – 2023-11-17 (×3): 10 mg via INTRAVENOUS
  Filled 2023-11-12 (×5): qty 2

## 2023-11-12 MED ORDER — MELATONIN 5 MG PO TABS: 5.0000 mg | ORAL_TABLET | Freq: Every day | ORAL | Status: DC

## 2023-11-12 MED ORDER — FUROSEMIDE 10 MG/ML IJ SOLN
40.0000 mg | Freq: Once | INTRAMUSCULAR | Status: AC
  Administered 2023-11-12: 40 mg via INTRAVENOUS
  Filled 2023-11-12: qty 4

## 2023-11-12 MED ORDER — FERROUS SULFATE 325 (65 FE) MG PO TABS
325.0000 mg | ORAL_TABLET | Freq: Every day | ORAL | Status: DC
  Administered 2023-11-13 – 2023-11-15 (×3): 325 mg via ORAL
  Filled 2023-11-12 (×3): qty 1

## 2023-11-12 MED ORDER — ATORVASTATIN CALCIUM 40 MG PO TABS
40.0000 mg | ORAL_TABLET | Freq: Every day | ORAL | Status: DC
  Administered 2023-11-13 – 2023-11-14 (×2): 40 mg via ORAL
  Filled 2023-11-12 (×3): qty 1

## 2023-11-12 MED ORDER — SODIUM CHLORIDE 0.9 % IV SOLN
3.0000 g | Freq: Four times a day (QID) | INTRAVENOUS | Status: DC
  Administered 2023-11-12 (×2): 3 g via INTRAVENOUS
  Filled 2023-11-12 (×2): qty 8

## 2023-11-12 MED ORDER — FUROSEMIDE 40 MG PO TABS
40.0000 mg | ORAL_TABLET | Freq: Two times a day (BID) | ORAL | Status: DC
  Administered 2023-11-12: 40 mg via ORAL
  Filled 2023-11-12: qty 1

## 2023-11-12 NOTE — Plan of Care (Signed)
  Problem: Consults Goal: RH LIMB LOSS PATIENT EDUCATION Description: Description: See Patient Education module for eduction specifics. Outcome: Progressing   Problem: RH BOWEL ELIMINATION Goal: RH STG MANAGE BOWEL WITH ASSISTANCE Description: STG Manage Bowel with mod I Assistance. Outcome: Progressing Goal: RH STG MANAGE BOWEL W/MEDICATION W/ASSISTANCE Description: STG Manage Bowel with Medication with mod I Assistance. Outcome: Progressing   Problem: RH SAFETY Goal: RH STG ADHERE TO SAFETY PRECAUTIONS W/ASSISTANCE/DEVICE Description: STG Adhere to Safety Precautions With cues Assistance/Device. Outcome: Progressing   Problem: RH PAIN MANAGEMENT Goal: RH STG PAIN MANAGED AT OR BELOW PT'S PAIN GOAL Description: < 4 with prns Outcome: Progressing   Problem: RH KNOWLEDGE DEFICIT LIMB LOSS Goal: RH STG INCREASE KNOWLEDGE OF SELF CARE AFTER LIMB LOSS Description: Patient and mother will be able to manage care at discharge using educational resources for medications, skin care, and dietary modification independently Outcome: Progressing   Problem: Consults Goal: RH LIMB LOSS PATIENT EDUCATION Description: Description: See Patient Education module for eduction specifics. Outcome: Progressing   Problem: RH BOWEL ELIMINATION Goal: RH STG MANAGE BOWEL WITH ASSISTANCE Description: STG Manage Bowel with mod I Assistance. Outcome: Progressing   Problem: RH BOWEL ELIMINATION Goal: RH STG MANAGE BOWEL W/MEDICATION W/ASSISTANCE Description: STG Manage Bowel with Medication with mod I Assistance. Outcome: Progressing   Problem: RH SAFETY Goal: RH STG ADHERE TO SAFETY PRECAUTIONS W/ASSISTANCE/DEVICE Description: STG Adhere to Safety Precautions With cues Assistance/Device. Outcome: Progressing   Problem: RH PAIN MANAGEMENT Goal: RH STG PAIN MANAGED AT OR BELOW PT'S PAIN GOAL Description: < 4 with prns Outcome: Progressing   Problem: RH KNOWLEDGE DEFICIT LIMB LOSS Goal: RH STG  INCREASE KNOWLEDGE OF SELF CARE AFTER LIMB LOSS Description: Patient and mother will be able to manage care at discharge using educational resources for medications, skin care, and dietary modification independently Outcome: Progressing

## 2023-11-12 NOTE — Telephone Encounter (Signed)
 Dr. Harden is out of the office until Thursday. I sent a message to [place] to see if she can swing by and see pt during her rounds today.

## 2023-11-12 NOTE — Progress Notes (Signed)
 Occupational Therapy Note  Patient Details  Name: Kayla Schmitt MRN: [id number] Date of Birth: Aug 25, 1965  Occupational Therapy Discharge Note  This patient was unable to complete the inpatient rehab program due to change in medical status; therefore did not meet their long term goals. Pt left the program at a supervision/mod I assist level for their  nctional ADLs. This patient is being discharged from OT services at this time.  BIMS at time of d/c  Pt unable to complete due to medical status  See CareTool for functional status details.  If the patient is able to return to inpatient rehabilitation within 3 midnights, this may be considered an interrupted stay and therapy services will resume as ordered. Modification and reinstatement of their goals will be made upon completion of therapy service reevaluations.    Luree Palla E Manjot Hinks, MS, OTR/L  11/12/2023, 3:22 PM

## 2023-11-12 NOTE — Progress Notes (Signed)
 Patient ID: Kayla Schmitt, female   DOB: 1966/03/24, 58 y.o.   MRN: [id number]  Patient saturations on room air at rest = 86% Patient saturations on room air while ambulating = 82% Patient saturations on 2 Liters of oxygen  while ambulating = 92%  Briefly explain why patient needs home oxygen : Pt has CHF, PVD and pneumonia and is requiring continuous O2 from 1-2 liters.

## 2023-11-12 NOTE — Progress Notes (Signed)
 PROGRESS NOTE   Subjective/Complaints: Continues to have shortness of breath, currently requiring 2L and satting at 92%, cardiology consulted regarding EKG results, Unasyn  started given possible pneumonia  ROS:   +SOB-continues, abd pain, CP, N/V/C/D, and vision changes   Objective:   DG Chest 2 View Result Date: 11/11/2023 CLINICAL DATA:  Shortness of breath. EXAM: CHEST - 2 VIEW COMPARISON:  November 07, 2023. FINDINGS: Stable cardiomediastinal silhouette. Left-sided PICC line is noted with distal tip looped within SVC. Increased bilateral patchy and interstitial airspace opacities are noted suggesting worsening pneumonia or pulmonary edema. Small pleural effusions are noted. Bony thorax is unremarkable. IMPRESSION: Significantly increased bilateral lung opacities are noted suggesting worsening pneumonia or edema with small pleural effusions. Electronically Signed   By: Lynwood Landy Raddle M.D.   On: 11/11/2023 15:08     Recent Labs    11/11/23 0601 11/12/23 0405  WBC 9.7 11.4*  HGB 7.7* 9.1*  HCT 24.7* 29.5*  PLT 405* 496*   Recent Labs    11/11/23 0601 11/12/23 0405  NA 135 135  K 4.0 4.4  CL 100 100  CO2 26 23  GLUCOSE 172* 181*  BUN 32* 34*  CREATININE 1.28* 1.28*  CALCIUM  8.5* 8.7*     Intake/Output Summary (Last 24 hours) at 11/12/2023 0958 Last data filed at 11/12/2023 0904 Gross per 24 hour  Intake 1735 ml  Output --  Net 1735 ml        Physical Exam: Vital Signs Blood pressure 128/67, pulse (!) 103, temperature 98.1 F (36.7 C), temperature source Oral, resp. rate 18, height 5' 4 (1.626 m), weight 92.5 kg, SpO2 97%.    General: awake, alert, appropriate, sitting EOB with OT- mother in room; NAD HENT: conjugate gaze; oropharynx moist CV: regular rhythm- mildly tachycardic; no JVD Pulmonary: improved air movement- no W/R/R today GI: soft, NT, ND, (+)BS Psychiatric: appropriate- interactive; a  little anxious about d/c Neurological: Ox3  Ext: no clubbing, cyanosis, 1+ RLE edema- about the same- L BKA- incision looks great- no drainage seen on pt- but has on cover on w/c. Psych: pleasant and cooperative  Musculoskeletal:     Cervical back: Neck supple. No tenderness. Left BK swollen, tender. Wearing shrinker, incision is healing well     Skin:    General: Skin is warm and dry.     Comments: Left BK incision well approximated with staples. Sl serosanguinous drainage from corners.    Neurological:     Mental Status: She is alert and oriented to person, place, and time.     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 Prior neuro assessment is c/w 11/12/2023 exam.  Functional mobility: propelling WC independently     Assessment/Plan: 1. Functional deficits which require 3+ hours per day of interdisciplinary therapy in a comprehensive inpatient rehab setting. Physiatrist is providing close team supervision and 24 hour management of active medical problems listed below. Physiatrist and rehab team continue to assess barriers to discharge/monitor patient progress toward functional and medical  goals  Care Tool:  Bathing    Body parts bathed by patient: Right arm, Left arm, Chest, Abdomen, Left upper leg, Face, Front perineal area, Buttocks, Right upper leg, Right lower leg   Body parts bathed by helper: Right lower leg, Front perineal area, Buttocks Body parts n/a: Left lower leg   Bathing assist Assist Level: Set up assist     Upper Body Dressing/Undressing Upper body dressing   What is the patient wearing?: Pull over shirt    Upper body assist Assist Level: Independent with assistive device    Lower Body Dressing/Undressing Lower body dressing      What is the patient wearing?: Ace wrap/stump shrinker     Lower body  assist Assist for lower body dressing: Set up assist     Toileting Toileting    Toileting assist Assist for toileting: Minimal Assistance - Patient > 75%     Transfers Chair/bed transfer  Transfers assist     Chair/bed transfer assist level: Supervision/Verbal cueing     Locomotion Ambulation   Ambulation assist   Ambulation activity did not occur: Safety/medical concerns (pain, decreased balance, wound on R heel)          Walk 10 feet activity   Assist  Walk 10 feet activity did not occur: Safety/medical concerns (pain, decreased balance, wound on R heel)        Walk 50 feet activity   Assist Walk 50 feet with 2 turns activity did not occur: Safety/medical concerns (pain, decreased balance, wound on R heel)         Walk 150 feet activity   Assist Walk 150 feet activity did not occur: Safety/medical concerns (pain, decreased balance, wound on R heel)         Walk 10 feet on uneven surface  activity   Assist Walk 10 feet on uneven surfaces activity did not occur: Safety/medical concerns (pain, decreased balance, wound on R heel)         Wheelchair     Assist Is the patient using a wheelchair?: Yes Type of Wheelchair: Manual    Wheelchair assist level: Independent Max wheelchair distance: 150    Wheelchair 50 feet with 2 turns activity    Assist        Assist Level: Independent   Wheelchair 150 feet activity     Assist      Assist Level: Independent   Blood pressure 128/67, pulse (!) 103, temperature 98.1 F (36.7 C), temperature source Oral, resp. rate 18, height 5' 4 (1.626 m), weight 92.5 kg, SpO2 97%.   Medical Problem List and Plan: 1. 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  supervision goals         Con't CIR PT and OT Asked therapy to NOT d/c pt from therapy-  and reached out to nursing and SW- pt to NOT d/c Monday  Educated pt and mother again on her SOB and how to relates to many different issues- that we are balancing out her kidney and Cards/resp issues- as well as decompensating on 1 issue (BKA and anemia and need for IV ABX) makes her decompensate on other issues as well (Resp/Cards, etc)- Lasix  increased to 40mg  BID  Notes reviewed and is propelling WC independently  -  continue Lovenox  for DVT ppx -changed aspirin  back to plavix  as patient says her vascular surgeon was adamant she remain on plavix , informed cardiology  2) Inferior infarct on EKG: consulted cardiology   3. Pain Management: continue Oxycodone  prn.   -on gabapentin  200mg  tid for phantom pain, RLS  -pain seems controlled 4. Mood/Behavior/Sleep: LCSW to follow for evaluation and support.               -antipsychotic agents: n/a 5. Neuropsych/cognition: This patient is capable of making decisions on her own behalf. 6. Skin/Wound Care: Pressure relief measures. Protein supplements added. Monitor for recurrent drainage.  -vac off             --will place dry dressing over corner of incision until drainage ceases  -continue shrinker. Pt can don/doff herself  7/12- will order smaller shrinkers 3XL per therapy request 7. Fluids/Electrolytes/Nutrition: Monitor I/O.   8.  MRSA bacteremia: Last CK 522-->continue Daptomycin  with EOT  11/17/23             --weekly CRP, Sed rate, CPK in addition to CBC/BMP.             --follow up with Dr. Dea 07/17 @ 9:30 am prior to EOT. Zinc , Vitamin C , Vitamin D  supplements started  -afebrile, clinically stable 9. T2DM on chronic insulin : Hgb A1C- 5.5 and well controlled (down from 6.4 earlier this year). Monitor BS ac/hs and use SSI for elevated BS.              -continue insulin  glargine 10 units and SSI was added today  D/c HS SSI, improved control over all. --consider increasing lantus --monitor for now  7/12-7/13 CBG's looking good- con't  regimen  CBG (last 3)  Recent Labs    11/11/23 1116 11/11/23 1633 11/12/23 0530  GLUCAP 160* 152* 172*    10. Severe PAD s/p stents: On Plavix  and atorvastatin .  Monitor chronically draining area rea on right transmet site.   11. 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   -7/7 hgb down to 7.0---asymptomatic  -stools recently heme negative  -7/11 hgb stable at 7.3 (concentration effect) but believe she's symptomatic from a stamina and exertion standpoint -she's diuresed enough where we can slowly give 1u prbc -20mg  iv lasix   7/12- will give IV iron  dosing, since Hb up to 8.0- but pt still symptomatic- with tachycardic- yellow MEWS and intermittently low O2 sats 7/13- Pharmacy doesn't want pt to receive any more IV iron  until OFF ABX of all kinds-  Hb up slightly to 8.2- pt really wants to get up to 10 before d/c- I explained that rules have changed- we cannot transfuse her to Hb of 10- but If Cards decides she needs 1 more unit, we will do that.   7/14: additional 1U PRBC ordered as per cardiology recommendations  7/15: Hgb reviewed and has improved to 9.1 12.  CAD/HFpEF w/EF  - EF 60-65%:  with hx of severe AS, mod MR -pt with another episode of sob yesterday -pulmonary edema on cxr (don't believe this is infectious) -now on iv lasix  BID -strict I's and O's -7/11 weight down to 91.8kg  -K+ supp  -renal fxn holding resonably well  -appreciate cardiology consult. Considering jardiance , spiro  -spoke with pt/mother about why she's experiencing the sob/fluid overload 7/12- Weight down to 91 kg- even after blood- got IV Lasix  multiple times yesterday- finally started peeing a lot at 5am this morning.  Educated  pt that Cards feels she needs CABG/MVR sooner than later to help SOB/pulmonary edema.  7/13- Weight 91 kg- stable from yesterday- having good output. Cards doesn't want to change IV Lasix  40 mg BID 7/14: meds reviewed and is not receiving Lasix - ordered 40x1  since she is having SOB, CR reviewed and is improved, will monitor tomorrow, CXR ordered 7/15: lasix  increased to 40mg  BID Filed Weights   11/10/23 0500 11/11/23 0500 11/12/23 0500  Weight: 91 kg 92.1 kg 92.5 kg     14.  Severe malnutrition: Albumin  level <1.5 w/ calcium  7.6-->corrects to 9.5. RD consulted  -improvement here will also help swelling.  -alb now improved to  2.0 15.  Abnormal LFTs: resolved  18. AKI: continue to monitor creatinine given increase in lasix   -7/11-- stable  7/12- Cr up to 1.27- will check in AM- and reduce Lasix  slightly if if continues to climb-  7/13- Cr 1.33- up from 1.27 and BUN 32- GFR down to 47- will d/w Cards since likely directly related to Lasix  40 mg IV BID-    LOS: 14 days A FACE TO FACE EVALUATION WAS PERFORMED  Sven P Manoj Enriquez 11/12/2023, 9:58 AM

## 2023-11-12 NOTE — Plan of Care (Signed)
  Problem: RH BOWEL ELIMINATION Goal: RH STG MANAGE BOWEL WITH ASSISTANCE Description: STG Manage Bowel with  mod I Assistance. Outcome: Progressing Goal: RH STG MANAGE BOWEL W/MEDICATION W/ASSISTANCE Description: STG Manage Bowel with Medication with  mod I Assistance. Outcome: Progressing   Problem: RH SAFETY Goal: RH STG ADHERE TO SAFETY PRECAUTIONS W/ASSISTANCE/DEVICE Description: STG Adhere to Safety Precautions With cues Assistance/Device. Outcome: Progressing   Problem: RH PAIN MANAGEMENT Goal: RH STG PAIN MANAGED AT OR BELOW PT'S PAIN GOAL Description: < 4 with prns Outcome: Progressing

## 2023-11-12 NOTE — Progress Notes (Signed)
 Notified MD and PA of EKG results.   Geni Armor, LPN

## 2023-11-12 NOTE — Progress Notes (Addendum)
 Patient continuously complained of dyspnea throughout the day, patient saturations ranged from 86% to 92% this was on oxygen  2L and having to be increased throughout the day New orders placed. Patient discharged from CIR.  Followed up on blood work that was ordered, per patient phlebotomist was unable to obtain specimen. However this nurse called multiple times and did not get an answer from any number. Notified Dr Tobie, Dr. Lorilee, and nurse receiving this patient on 3E. Charge RN placed order for IV team at this point.    Geni Armor, LPN

## 2023-11-12 NOTE — Telephone Encounter (Signed)
 Rolan, [place] from [facility] called triage about patient this morning. Requests if Dr. Duda could come take a look at patient on the Rehab floor at [phone] Patient has increased drainage and white cell count, they have started antibiotics.  Call back number [phone]

## 2023-11-12 NOTE — Consult Note (Signed)
 Neuropsychological Consultation Comprehensive Inpatient Rehab   Patient:   Kayla Schmitt   DOB:   1965/05/08  MR Number:  [id number]  Location:  [facility] [address] CENTER [address] [place] [address] Dept: [phone] Loc: [phone]           Date of Service:   11/12/2023  Start Time:   1:45 PM End Time:   2:15 PM  Provider/Observer:  Norleen Asa, Psy.D.       Clinical Neuropsychologist       Billing Code/Service: [phone]  Reason for Service:    Kayla Schmitt is a 58 year old female referred for neuropsychological consultation during her current admission to the comprehensive inpatient rehabilitation unit.  PRESENTING PROBLEM(s) - Coping and adjustment issues during ongoing admission to comprehensive inpatient rehabilitation unit - Respiratory distress with persistent symptoms over past week - Functional decline requiring assistance with activities of daily living  HISTORY OF PRESENTING PROBLEM(S) - History of Presenting Problem(s): The patient was admitted to [facility] with lethargy, fever, acute kidney injury, and hypertension due to sepsis from MRSA bacteremia. Purple area of discoloration noted on left leg with blistering, no clear source of infection identified. Transferred to [facility] for management. MRI left lower extremity showed diffuse edema anterior and posterior compartment of left shin and calf extending from knee through ankle as well as deep intramuscular fascia and deep to medial gastric muscle, concerning for cellulitis versus fasciitis with blistering extending along left posterior medial ankle. Underwent left below-knee amputation with open flap wrapped with wound vac, followed by revision of BKA on 10/23/2023. Cognition improving as metabolic status and infection better managed. Admitted to comprehensive inpatient rehabilitation due to functional decline and need for  assistance with ADLs as patient lives alone in [place] .  MEDICAL HISTORY - Personal and Family Medical History: Type two diabetes with neuropathy, GERD, CAD with combined congestive heart failure and aortic stenosis, GIB due to AVM, tobacco use in past with smoking cessation six to seven years ago, restless leg syndrome, PAD with transmetatarsal amputation bilateral feet last year with non-healing wound on right  Medical History:   Past Medical History:  Diagnosis Date   Anemia    Aortic stenosis    Arthritis    CHF (congestive heart failure) (HCC)    Coronary artery disease    Diabetes mellitus without complication (HCC)    type 2   GERD (gastroesophageal reflux disease)    Heart murmur    History of blood transfusion    Hypercholesteremia    Hypertension    Hypothyroidism    Mitral regurgitation    Myocardial infarction [facility])    Peripheral vascular disease (HCC)    PONV (postoperative nausea and vomiting)    Thyroid disease          Patient Active Problem List   Diagnosis Date Noted   Pressure injury of skin 11/11/2023   Unilateral complete BKA, left, sequela (HCC) 10/29/2023   S/P BKA (below knee amputation) unilateral, left (HCC) 10/29/2023   Necrotizing fasciitis (HCC) 10/22/2023   Gangrene of left foot (HCC) 10/21/2023   MRSA bacteremia 10/21/2023   Sepsis (HCC) 10/20/2023   AKI (acute kidney injury) (HCC) 10/20/2023   Diffuse pain in left lower extremity 10/20/2023   History of GI bleed 10/20/2023   Elevated troponin 10/20/2023   Hypokalemia 10/20/2023   History of coronary artery disease 10/20/2023   Prolonged QT interval 10/20/2023  AVM (arteriovenous malformation) of small bowel, acquired 04/07/2023   Antiplatelet or antithrombotic long-term use 04/04/2023   Iron  deficiency anemia 04/04/2023   Acute on chronic diastolic heart failure (HCC) 04/03/2023   Osteomyelitis of foot, right, acute (HCC) 12/12/2022   CAD (coronary artery disease)  11/06/2022   Aortic stenosis 11/06/2022   Mitral regurgitation 08/27/2022   NSTEMI (non-ST elevated myocardial infarction) (HCC) 08/18/2022   Acute clinical systolic heart failure (HCC) 08/18/2022   Acute blood loss anemia 08/18/2022   Anemia 08/18/2022   Critical limb ischemia of both lower extremities (HCC) 06/19/2022   Insulin  dependent type 2 diabetes mellitus (HCC) 01/28/2020   Hyperlipidemia 01/28/2020   Hypertension 01/28/2020   Hypothyroidism 01/28/2020   Obesity 01/28/2020   Ulcerated, foot, right, limited to breakdown of skin (HCC)    Cellulitis of right foot    PVD (peripheral vascular disease) (HCC)    Cellulitis 01/11/2020   Blister of leg 01/06/2020   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/28/2017   Chest pain 10/28/2017   GERD (gastroesophageal reflux disease) 10/28/2017    Disposition/Plan:   - The patient was in considerable respiratory distress during attempted consultation - Blood oxygen  levels reached nadir of 88% during visit - Pulmonary consultation called but had not arrived at time of assessment - Client unable to participate in psychological consultation due to respiratory symptoms - Fluid overload issues noted following amputation surgery with ongoing management of fluid removal - Client's mother present and provided collateral information regarding respiratory symptoms worsening over past week - Plan to return for psychological consultation when client's respiratory status stabilizes  Diagnosis:    Anxiety state         Electronically Signed   _______________________ Norleen Asa, Psy.D. Clinical Neuropsychologist

## 2023-11-12 NOTE — Progress Notes (Signed)
  Progress Note Patient Name: Kayla Schmitt Date of Encounter: 11/12/2023 [facility] Cardiologist: Vishnu P Mallipeddi, MD   Interval Summary   BP 128/67. Cr stable at 1.28. Hgb 9.1.  Denies any chest pain.  Continues to have dyspnea, though reports feeling improved since her PRBC transfusion yesterday  Vital Signs Vitals:   11/11/23 1341 11/11/23 1511 11/11/23 1918 11/12/23 0500  BP: 101/65 109/62 111/75 128/67  Pulse: (!) 106 (!) 102 97 (!) 103  Resp: 18  20 18   Temp: 98.3 F (36.8 C) 98.7 F (37.1 C) 98.5 F (36.9 C) 98.1 F (36.7 C)  TempSrc: Oral Oral Oral Oral  SpO2: 91% 96% 97% 97%  Weight:    92.5 kg  Height:        Intake/Output Summary (Last 24 hours) at 11/12/2023 0930 Last data filed at 11/12/2023 9095 Gross per 24 hour  Intake 1735 ml  Output --  Net 1735 ml      11/12/2023    5:00 AM 11/11/2023    5:00 AM 11/10/2023    5:00 AM  Last 3 Weights  Weight (lbs) 203 lb 14.8 oz 203 lb 0.7 oz 200 lb 9.9 oz  Weight (kg) 92.5 kg 92.1 kg 91 kg      Telemetry/ECG  No telemetry available- Personally Reviewed EKG sinus tachycardia, rate 105, LVH, poor R wave progression, inferior Q waves  Physical Exam  GEN: No acute distress.   Neck: No JVD Cardiac: RRR, 3/6 systolic murmur Respiratory: Clear to auscultation bilaterally. GI: Soft, nontender, non-distended  MS: 1+ edema right lower extremity, left BKA, right transmetatarsal amputation  Assessment & Plan  87-year female with history of PAD, CAD, MR, T2DM, hypertension we are consulted for evaluation of heart failure  Acute on chronic diastolic heart failure: Presented with volume overload and, had worsening lower extremity edema and dyspnea.  Has been diuresed with IV Lasix .  Lasix  held yesterday given rising creatinine - Lower extremity edema on exam but otherwise appears euvolemic.  Suspect hypoalbuminemia (2.0) contributing to edema.  Continue PO lasix   CAD: Admitted with NSTEMI 07/2022.   Cath showed severe multivessel disease.  Severe MR on echo.  Had plan for CABG/MVR but this was postponed given multiple ongoing medical issues. - Continue statin - Recommended switching from Plavix  to aspirin  81 mg daily for bleeding risk as has required multiple transfusions but her preference is to stay on plavix .   Aortic stenosis: Moderate to severe by TEE 09/2023  Mild regurgitation: Severe by echo in 2024.  TEE 09/2023 showed mild to moderate MR  MRSA bacteremia: Hospitalized 09/2023 with MRSA bacteremia.  TEE 10/24/2023 showed moderate MR, moderate to severe aortic stenosis, no endocarditis.  She had necrotizing fasciitis, underwent left below the knee amputation.  Anemia: Hemoglobin down to 7.7 on 7/14. Recommend transfuse for goal Hgb>8 given cardiac issues.  Suggested switching from plavix  to ASA 81 mg daily to decrease bleeding risk but she declines.  Received 1 unit PRBCs on 7/14, Hgb 9.1 today   For questions or updates, please contact [facility] Please consult [email or web address] for contact info under       Signed, Lonni LITTIE Nanas, MD

## 2023-11-12 NOTE — Progress Notes (Signed)
 [facility]       SS drainage on dressing No cellulitis minimal edema No ischemic skin changes SOB on O2 [place]  S/p Necrotizing Fasciitis Left Leg  Post revision left BKA 10/23/23  Stump appears viable and healing well 3xl stump sock wrapped with Kerlex and ace F/U OP for suture and staple removal in 1 week pending discharge once breathing improves. HGB 9.1  No growth on cultures Currently on Unasyn  and Daptomycin  Unasyn  started given possible pneumonia   Maurilio Deland Collet PA-C  [phone]

## 2023-11-12 NOTE — H&P (Signed)
 History and Physical  Patient: Kayla Schmitt FMW:[id number] DOB: [birth year] DOA: 11/12/2023 DOS: the patient was seen and examined on 11/12/2023 Patient coming from: CIR  Chief Complaint: Shortness of breath HPI:   Patient with PMH of CAD, aortic stenosis, mitral regurg, chronic combined CHF, PAD HTN, HLD, GI bleed, recent hospitalization for necrotizing fasciitis requiring left BKA. Patient was discharged to CIR on 7/1.  Completed her CIR therapy on 7/15 but continues to have complaints about shortness of breath progressively worsening since last few days.  She was treated with IV Lasix .  Nebulizer therapy.  As the days progressed her renal function worsened and therefore IV Lasix  was on hold.  Patient received some blood transfusion on 7/14 as her hemoglobin dropped to 7.8. On 7/15 patient reports continues to shortness of breath with saturation dropping down to 80s. Hospitalist service was consulted. At the time of my evaluation patient denies any chest pain but does have some chest tightness.  No nausea no vomiting no fever no chills.  Has ongoing cough which is dry. Has loose stool but no significant diarrhea.  No blood in the stool. Denies any burning urination. Has progressively worsening swelling of her right leg. Left BKA appears to be healing well. Right leg has area which is draining chronically. Patient has MRSA bacteremia and was on IV daptomycin .  Her CRP was improving but on 7/14 it jumped up to 21.8 from 7.4. Patient denies any new evidence of infection anywhere. Assessment and Plan: Acute on chronic diastolic (congestive) heart failure (HCC) Moderate mitral regurgitation CAD (coronary artery disease) Moderate to severe aortic stenosis Progressively worsening shortness of breath with significant volume overload. Also appears to be third spacing with hypoalbuminemia. Received IV Lasix  without any significant improvement with mild worsening of renal function. Will  provide aggressive IV Lasix .  Supplemented with IV albumin . Cardiology is aware of the patient's transfer from CIR where they were seeing the patient to hospital inpatient side. Monitor renal function. Because of her complaints of chest tightness an EKG was performed which does not show any evidence of acute ACS. Will check troponins. Lower extremity Doppler have been negative for any DVT. Plan is for CABG with mitral valve replacement although currently postponed given her active multiple medical issues. Currently on aspirin .  Cardiology recommended to stay off of Plavix .  Given bleeding risk.  MRSA bacteremia Necrotizing fasciitis (HCC) S/P BKA (below knee amputation) unilateral, left (HCC) Seen by ID during recent hospitalization Was on IV daptomycin . Echocardiogram has been negative for endocarditis. Currently on IV daptomycin  and Unasyn  to cover for possible pneumonia given elevated procalcitonin. Will perform blood cultures again. Given significantly elevated CRP level, there is a risk of recurrence of infection.  Ulcerated, foot, right, limited to breakdown of skin (HCC) No evidence of active infection.  Appears to be chronic.  PAD Seen by vascular surgery during recent admission. On Plavix .  Now switched to aspirin  given risk for bleeding.  Insulin  dependent type 2 diabetes mellitus (HCC) On Semglee  10 units for now. Continue sliding scale insulin .  GERD (gastroesophageal reflux disease) Continue PPI.  Hyperlipidemia Continue statin.  Hypertension Blood pressure stable.  Will hold blood pressure medication to allow room for diuresis.  Hypothyroidism Continue Synthroid .  Anemia Iron  deficiency anemia H&H dropping down to 7.8. Likely multifactorial.  Will check anemia panel.  AVM (arteriovenous malformation) of small bowel, acquired Prior history.  Noted.  Monitor.  Anxiety state Continue current regimen.  Advance Care Planning:   Code Status: Prior  Consults: Cardiology.  Prior to Admission medications   Medication Sig Start Date End Date Taking? Authorizing Provider  acetaminophen  (TYLENOL ) 500 MG tablet Take 1,000 mg by mouth 2 (two) times daily as needed for moderate pain (pain score 4-6), headache or fever.    [provider]  Alcohol Swabs  (PHARMACIST CHOICE ALCOHOL) PADS Apply topically. 04/09/23   [provider]  aspirin  81 MG EC tablet Take 81 mg by mouth daily.    [provider]  atorvastatin  (LIPITOR) 40 MG tablet Take 40 mg by mouth daily. 12/14/19   [provider]  bisacodyl  (DULCOLAX) 10 MG suppository Place 1 suppository (10 mg total) rectally daily as needed for moderate constipation or mild constipation. 10/29/23   Hongalgi, Anand D, MD  clopidogrel  (PLAVIX ) 75 MG tablet TAKE 1 TABLET BY MOUTH EVERY DAY WITH BREAKFAST 01/28/23   Sheree Penne Bruckner, MD  daptomycin  (CUBICIN ) IVPB Inject 700 mg into the vein daily for 6 days. Indication:  MRSA bacteremia First Dose: Yes Last Day of Therapy:  11/17/23 Labs - Once weekly:  CBC/D, BMP, and CPK Labs - Once weekly: ESR and CRP Method of administration: IV Push Method of administration may be changed at the discretion of home infusion pharmacist based upon assessment of the patient and/or caregiver's ability to self-administer the medication ordered. Followed by ID physician: Dr. Annalee Orem 11/11/23 11/17/23  Love, Sharlet RAMAN, PA-C  diphenhydrAMINE  HCl, Sleep, (ZZZQUIL) 25 MG CAPS Take 25 mg by mouth at bedtime as needed (Sleep).    [provider]  ferrous sulfate  325 (65 FE) MG EC tablet Take 1 tablet (325 mg total) by mouth daily with breakfast. 04/07/23 04/06/24  Fairy Frames, MD  furosemide  (LASIX ) 40 MG tablet Take 1 tablet (40 mg total) by mouth 2 (two) times daily. 08/27/22   Leotis Bogus, MD  gabapentin  (NEURONTIN ) 300 MG capsule Take 300 mg by mouth at bedtime.    [provider]  insulin  degludec (TRESIBA   FLEXTOUCH) 100 UNIT/ML FlexTouch Pen Inject 10 Units into the skin at bedtime. Patient taking differently: Inject 16 Units into the skin at bedtime. 01/15/20   Jonel Bruckner SQUIBB, MD  levothyroxine  (SYNTHROID ) 150 MCG tablet Take 150 mcg by mouth daily before breakfast. 12/02/19   [provider]  metoprolol  succinate (TOPROL -XL) 25 MG 24 hr tablet Take 0.5 tablets (12.5 mg total) by mouth daily. 08/28/22   Leotis Bogus, MD  nitroGLYCERIN  (NITROSTAT ) 0.4 MG SL tablet Place 1 tablet (0.4 mg total) under the tongue every 5 (five) minutes x 3 doses as needed for chest pain (if no relief after 3rd dose proceed to ED or call 911). 11/06/2022-New 11/06/22   Mallipeddi, Vishnu P, MD  oxyCODONE  (ROXICODONE ) 5 MG immediate release tablet Take 1 tablet (5 mg total) by mouth every 6 (six) hours as needed for severe pain (pain score 7-10). 10/15/23   Francesca Elsie CROME, MD  pantoprazole  (PROTONIX ) 40 MG tablet Take 40 mg by mouth daily. 12/21/19   [provider]  polyethylene glycol (MIRALAX  / GLYCOLAX ) 17 g packet Take 17 g by mouth 2 (two) times daily. 10/29/23   Hongalgi, Anand D, MD  promethazine  (PHENERGAN ) 25 MG tablet Take 1 tablet (25 mg total) by mouth every 6 (six) hours as needed for nausea or vomiting. 10/15/23   Francesca Elsie CROME, MD  senna-docusate (SENOKOT-S) 8.6-50 MG tablet Take 1 tablet by mouth 2 (two) times daily. 10/29/23   Judeth Trenda BIRCH, MD    Past Medical History:  Diagnosis Date   Anemia    Aortic stenosis    Arthritis    CHF (congestive heart failure) (HCC)    Coronary artery disease    Diabetes mellitus without complication (HCC)    type 2   GERD (gastroesophageal reflux disease)    Heart murmur    History of blood transfusion    Hypercholesteremia    Hypertension    Hypothyroidism    Mitral regurgitation    Myocardial infarction (HCC)    Peripheral vascular disease (HCC)    PONV (postoperative nausea and vomiting)    Thyroid disease    Past Surgical  History:  Procedure Laterality Date   ABDOMINAL AORTOGRAM W/LOWER EXTREMITY Bilateral 01/14/2020   Procedure: ABDOMINAL AORTOGRAM W/LOWER EXTREMITY;  Surgeon: Gretta Lonni PARAS, MD;  [facility]: [facility];  Service: Cardiovascular;  Laterality: Bilateral;   ABDOMINAL AORTOGRAM W/LOWER EXTREMITY N/A 07/05/2022   Procedure: ABDOMINAL AORTOGRAM W/LOWER EXTREMITY;  Surgeon: Gretta Lonni PARAS, MD;  Location: [facility];  Service: Cardiovascular;  Laterality: N/A;   ABDOMINAL AORTOGRAM W/LOWER EXTREMITY N/A 08/02/2022   Procedure: ABDOMINAL AORTOGRAM W/LOWER EXTREMITY;  Surgeon: Gretta Lonni PARAS, MD;  Location: [facility];  Service: Cardiovascular;  Laterality: N/A;   ABDOMINAL AORTOGRAM W/LOWER EXTREMITY N/A 08/06/2022   Procedure: ABDOMINAL AORTOGRAM W/LOWER EXTREMITY;  Surgeon: Gretta Lonni PARAS, MD;  [facility]: [facility];  Service: Cardiovascular;  Laterality: N/A;   AMPUTATION Bilateral 07/20/2022   Procedure: AMPUTATION RIGHT GREAT TOE AND SECOND TOE, AMPUTATION LEFT GREAT TOE;  Surgeon: Harden Jerona GAILS, MD;  Location: [facility];  Service: Orthopedics;  Laterality: Bilateral;   AMPUTATION Bilateral 08/08/2022   Procedure: BILATERAL TRANSMETATARSAL AMPUTATION;  Surgeon: Harden Jerona GAILS, MD;  Location: [facility] OR;  Service: Orthopedics;  Laterality: Bilateral;   AMPUTATION Left 10/22/2023   Procedure: AMPUTATION BELOW KNEE;  Surgeon: Lanis Fonda BRAVO, MD;  Location: [facility] OR;  Service: Vascular;  Laterality: Left;   AMPUTATION TOE     BIOPSY  04/06/2023   Procedure: BIOPSY;  Surgeon: Lavita Elspeth SQUIBB, MD;  Location: [facility] ENDOSCOPY;  Service: Gastroenterology;;   COLONOSCOPY WITH PROPOFOL  N/A 04/06/2023   Procedure: COLONOSCOPY WITH PROPOFOL ;  Surgeon: Cahterine Elspeth SQUIBB, MD;  Location: [facility] ENDOSCOPY;  Service: Gastroenterology;  Laterality: N/A;   ESOPHAGOGASTRODUODENOSCOPY (EGD) WITH PROPOFOL  N/A 04/06/2023   Procedure: ESOPHAGOGASTRODUODENOSCOPY (EGD) WITH PROPOFOL ;   Surgeon: Eboney Elspeth SQUIBB, MD;  Location: [facility] ENDOSCOPY;  Service: Gastroenterology;  Laterality: N/A;   GIVENS CAPSULE STUDY N/A 04/06/2023   Procedure: GIVENS CAPSULE STUDY;  Surgeon: Miesha Elspeth SQUIBB, MD;  Location: [facility] ENDOSCOPY;  Service: Gastroenterology;  Laterality: N/A;   PERIPHERAL VASCULAR ATHERECTOMY Right 01/14/2020   Procedure: PERIPHERAL VASCULAR ATHERECTOMY;  Surgeon: Gretta Lonni PARAS, MD;  Location: [facility] INVASIVE CV LAB;  Service: Cardiovascular;  Laterality: Right;  sfa   PERIPHERAL VASCULAR INTERVENTION Right 01/14/2020   Procedure: PERIPHERAL VASCULAR INTERVENTION;  Surgeon: Gretta Lonni PARAS, MD;  [facility];  Service: Cardiovascular;  Laterality: Right;  sfa stent    PERIPHERAL VASCULAR INTERVENTION  07/05/2022   Procedure: PERIPHERAL VASCULAR INTERVENTION;  Surgeon: Gretta Lonni PARAS, MD;  [facility];  Service: Cardiovascular;;   PERIPHERAL VASCULAR INTERVENTION  08/02/2022   Procedure: PERIPHERAL VASCULAR INTERVENTION;  Surgeon: Gretta Lonni PARAS, MD;  [facility];  Service: Cardiovascular;;   PERIPHERAL VASCULAR INTERVENTION  08/06/2022   Procedure: PERIPHERAL VASCULAR INTERVENTION;  Surgeon: Gretta Lonni PARAS, MD;  Location: [facility] INVASIVE CV  LAB;  Service: Cardiovascular;;   PERIPHERAL VASCULAR THROMBECTOMY  08/02/2022   Procedure: PERIPHERAL VASCULAR THROMBECTOMY;  Surgeon: Gretta Lonni PARAS, MD;  [facility];  Service: Cardiovascular;;   REVISION AMPUTATION, BELOW THE KNEE Left 10/23/2023   Procedure: REVISION AMPUTATION, BELOW THE KNEE;  Surgeon: Harden Jerona GAILS, MD;  Location: [facility] OR;  Service: Orthopedics;  Laterality: Left;  REVISION LEFT BELOW KNEE AMPUTATION   RIGHT/LEFT HEART CATH AND CORONARY ANGIOGRAPHY N/A 08/23/2022   Procedure: RIGHT/LEFT HEART CATH AND CORONARY ANGIOGRAPHY;  Surgeon: Dann Candyce RAMAN, MD;  Location: [facility] INVASIVE CV LAB;  Service: Cardiovascular;  Laterality: N/A;    SKIN SPLIT GRAFT Right 03/18/2020   Procedure: SKIN GRAFTING RIGHT FOOT ULCER;  Surgeon: Harden Jerona GAILS, MD;  Location: [facility] OR;  Service: Orthopedics;  Laterality: Right;   STUMP REVISION Right 11/30/2022   Procedure: REVISION RIGHT TRANSMETATARSAL AMPUTATION;  Surgeon: Harden Jerona GAILS, MD;  Location: [facility] OR;  Service: Orthopedics;  Laterality: Right;   TEE WITHOUT CARDIOVERSION N/A 08/27/2022   Procedure: TRANSESOPHAGEAL ECHOCARDIOGRAM;  Surgeon: Hobart Powell BRAVO, MD;  Location: [facility] INVASIVE CV LAB;  Service: Cardiovascular;  Laterality: N/A;   TRANSESOPHAGEAL ECHOCARDIOGRAM (CATH LAB) N/A 10/24/2023   Procedure: TRANSESOPHAGEAL ECHOCARDIOGRAM;  Surgeon: Michele Richardson, DO;  [facility];  Service: Cardiovascular;  Laterality: N/A;   WISDOM TOOTH EXTRACTION     Social History:  reports that she quit smoking about 6 years ago. Her smoking use included cigarettes. She has never used smokeless tobacco. She reports that she does not currently use alcohol. She reports current drug use. Drug: Marijuana. Allergies  Allergen Reactions   Trental  [Pentoxifylline ] Nausea And Vomiting   Vibramycin [Doxycycline] Nausea And Vomiting   Penicillins Rash    Tolerated cephalosporins 07/2022    Family History  Problem Relation Age of Onset   Diabetes Other    CAD Other    Physical Exam: Vitals:   11/12/23 1800  BP: 117/72  Pulse: (!) 123  Resp: (!) 25  Temp: 98 F (36.7 C)  TempSrc: Oral  SpO2: 94%  Weight: 92.5 kg  Height: 5' 4 (1.626 m)   General: Appear in moderate distress; no visible Abnormal Neck Mass Or lumps, Conjunctiva normal Cardiovascular: S1 and S2 Present, aortic systolic  Murmur, Respiratory: increased respiratory effort, Bilateral Air entry present and basal Crackles, no wheezes Abdomen: Bowel Sound present, Non tender  Extremities: Right pedal edema Neurology: alert and oriented to time, place, and person Gait not checked due to patient safety concerns   Data  Reviewed: I have reviewed ED notes, Vitals, Lab results and outpatient records. Since last encounter, pertinent lab results CBC and BMP   . I have ordered test including CBC and BMP  . I have discussed pt's care plan and test results with cardiology and CAR  . I have ordered imaging chest x-ray  .   Family Communication: Mother at bedside  Author: Yetta Blanch, MD 11/12/2023 6:49 PM For on call review [email or web address].

## 2023-11-12 NOTE — Progress Notes (Signed)
 Occupational Therapy Note  Patient Details  Name: Kayla Schmitt MRN: [id number] Date of Birth: January 08, 1966   Pt's plan of care adjusted to Q D after speaking with care team and discussed with MD in team conference as pt currently unable to tolerate current therapy schedule with OT, PT.     Melroy Bougher E Caliyah Sieh, MS, OTR/L  11/12/2023, 11:43 AM

## 2023-11-12 NOTE — Progress Notes (Signed)
 Pharmacy Antibiotic Note  Kayla Schmitt is a 58 y.o. female admitted to CIR on 10/29/2023 with Functional deficits secondary due to L-BKA due to necrotizing fascitis. Hx of right transmetatarsal amputation. Admitted to CIR on 7/1 from [facility] Acute hospitalization 10/20/23 to 10/29/23.  Currently on Daptomycin  for MRSA bacteremia.  Pharmacy consulted for Unasyn  dosing 7/15 for aspiration pneumonia.   MRSA bacteremia. Wound VAC dc 7/9  Continues on Daptomycin  700 mg IV daily thru 11/17/23. Monitor CK every Monday, on lipitor.   CK 522 (6/26) likely related to recent surgical procedures on 6/24 + 6/25 6/26 CK 522 6/30 CK 193 7/7   CK 478 7/14 CK 106    Antimicrobials ([facility] acute admit and CIR admit: Cefepime  6/20 >> 6/24 Vanc 6/20 >>6/24 Dapto 6/25 >> 7/20 Unasyn  7/15>>     6/20 BCx ([facility]):  2/2 MRSA 6/20 UCx ([facility]):  NG F 6/22 BCx: negative F 6/23 Surgical MRSA PCR: negative 6/25 Tissue Cx:  negative F   Plan: - Start Unasyn  3g IV every 6 hours - Continues on Daptomycin  700 mg IV every 24 hours - CK every Monday   Height: 5' 4 (162.6 cm) Weight: 92.5 kg (203 lb 14.8 oz) IBW/kg (Calculated) : 54.7  Temp (24hrs), Avg:98.4 F (36.9 C), Min:98.1 F (36.7 C), Max:98.7 F (37.1 C)  Recent Labs  Lab 11/08/23 0410 11/09/23 0453 11/10/23 0305 11/11/23 0601 11/12/23 0405  WBC 7.2 8.6 10.3 9.7 11.4*  CREATININE 1.14* 1.27* 1.33* 1.28* 1.28*    Estimated Creatinine Clearance: 54.1 mL/min (A) (by C-G formula based on SCr of 1.28 mg/dL (H)).    Allergies  Allergen Reactions   Trental  [Pentoxifylline ] Nausea And Vomiting   Vibramycin [Doxycycline] Nausea And Vomiting   Penicillins Rash    Tolerated cephalosporins 07/2022     Antimicrobials this admission: Vancomycin  6/23 >> 6/25 Cefepime  6/23 >> 6/24 Daptomycin  6/25 >>  Dose adjustments this admission: Vancomycin  >> Daptomycin  on 6/25  Microbiology results: 6/20 OSH >> 4/4 MRSA (results in care  everywhere) 6/22 BCx >> ngx3d 6/25 tissue cx >> ng<24h   Thank you for allowing pharmacy to be a part of this patient's care.  Levorn Gaskins, RPh Clinical Pharmacist 11/12/2023 12:13 PM  [phone]

## 2023-11-12 NOTE — Progress Notes (Signed)
 Physical Therapy Session Note  Patient Details  Name: Kayla Schmitt MRN: [id number] Date of Birth: 1966/01/29  Today's Date: 11/12/2023 PT Individual Time: 9154-9044 and 8699-8671 PT Individual Time Calculation (min): 70 min and 28 min PT Missed Time: 32 minutes PT Missed Time Reason: SOB and fatigue  Short Term Goals: Week 1:  PT Short Term Goal 1 (Week 1): pt will transfer bed<>chair with LRAD and CGA PT Short Term Goal 1 - Progress (Week 1): Met PT Short Term Goal 2 (Week 1): pt will transfer sit<>stand with LRAD and CGA PT Short Term Goal 2 - Progress (Week 1): Progressing toward goal PT Short Term Goal 3 (Week 1): pt will perform simulated car transfer with LRAD and CGA PT Short Term Goal 3 - Progress (Week 1): Met Week 2:  PT Short Term Goal 1 (Week 2): STG=LTG due to LOS  Skilled Therapeutic Interventions/Progress Updates:   Treatment Session 1 Received pt sitting in WC at sink on 1L O2 90%. Pt reported not sleeping last night with inability to breathe unless upright. Pt much more SOB today vs Saturday, even at rest, becoming winded having basic conversation with therapist - RN present and MD notified and present for morning rounds to discuss. Per MD, pt with pulmonary edema with possible pneumonia with high WBC. Removed supplemental O2 and SPO2 dropped to 86% at rest and only able to recover to 87% on RA - increased to 1L and SPO2 89-90%. Pt agreeable to PT treatment and did not state pain level, however per OT, pt with increased drainage from residual limb this morning.   Pt requested to remain in room for therapy. Doffed dirty shirt and donned clean one with supervision with assist to manage O2. Pt declined putting on underwear/pants due to feeling terrible. Pt performed the following seated exercises with emphasis on LE strength and endurance: -R hamstring curls with grn TB 2x12 -hip abduction with red TB 2x15 Pt transferred into recliner via squat<>pivot with supervision  to get in more comfortable position to rest and breathe. Dr. Crist team present to inspect incision. Unwrapped limb and noted mild serosanguineous drainage but incision healing well. Donned 3XL shrinker with max A and placed gauze and ace wrap on top to catch additional drainage. Educated pt's mother on technique for ace wrapping on top of shrinker with caution to avoid windows, holes, gaps, and tourniquets - needs continued practice. Cardiology present for brief assessment with pt and RN in/out during session. Pt requesting more O2 - increased to 2L and SPO2 93%; MD notified and RN aware. Concluded session with pt sitting in recliner with all needs within reach.   Treatment Session 2 Received pt sitting in [facility] with lunch tray untouched and mother at bedside. Pt reports it's too difficulty to eat and breathe at the same time. Pt on 2L with SPO2 89% increasing up to 93% with pursed lip breathing. Pt unable to catch her breath, reporting not feeling any better than this morning. MD notified and ordered to increase to 3L - SPO2 93% and pt reporting starting to feel a little better. Discussed poor sleep last night and requested something to help pt sleep - MD to order Melatonin. Pt ultimately unable to continue therapy session due to fatigue and SOB and left sitting in [facility] with all needs within reach. 32 minutes missed of skilled physical therapy due to SOB and fatigue.    Therapy Documentation Precautions:  Precautions Precautions: Fall Precaution/Restrictions Comments: R transmet amputation Restrictions Weight  Bearing Restrictions Per Provider Order: Yes LLE Weight Bearing Per Provider Order: Non weight bearing  Therapy/Group: Individual Therapy Kayla Schmitt Kayla Schmitt PT, DPT 11/12/2023, 6:52 AM

## 2023-11-12 NOTE — Progress Notes (Signed)
 Inpatient Rehabilitation Care Coordinator Discharge Note   Patient Details  Name: Sherlon Nied MRN: [id number] Date of Birth: 13-Jun-1965   Discharge location: TRANSFERRING TO ACUTE DUE TO MEDICAL ISSUES  Length of Stay: 14 days  Discharge activity level: MOD/I-SUPERVISION  Home/community participation: ACTIVE  Patient response un:Yzjouy Literacy - How often do you need to have someone help you when you read instructions, pamphlets, or other written material from your doctor or pharmacy?: Never  Patient response un:Dnrpjo Isolation - How often do you feel lonely or isolated from those around you?: Patient unable to respond  Services provided included: MD, RD, PT, OT, RN, CM, TR, Pharmacy, Neuropsych, SW  Financial Services:  Field seismologist Utilized: Engineer, agricultural offered to/list presented to: PT  Follow-up services arranged:  Home Health, DME, Patient/Family has no preference for HH/DME agencies Home Health Agency: [facility] PT  [facility]-RN    DME : [facility]  ROLL[facility] AND HOME O2-CONCENTRATOR AND PORTABLE TANK  Have informed Holley Herring of transfer to acute has been following for home infusion and [facility] for PT follow up  Patient response to transportation need: Is the patient able to respond to transportation needs?: Yes In the past 12 months, has lack of transportation kept you from medical appointments or from getting medications?: No In the past 12 months, has lack of transportation kept you from meetings, work, or from getting things needed for daily living?: No   Patient/Family verbalized understanding of follow-up arrangements:  Yes  Individual responsible for coordination of the follow-up plan: SELF [phone]  Confirmed correct DME delivered: Raymonde Asberry MATSU 11/12/2023    Comments (or additional information):MOM HAS BEEN HERE AND VERY SUPPORTIVE.  Summary of Stay    Date/Time Discharge Planning  CSW  11/06/23 0914 Home- mom to assist and has been here. Will need IV antibitoics at home for another week and follow up. Ordered RW has other equipment. RGD  10/30/23 1026 New evaluation-home with Mom assisting if needed. Hopes to be mod/i wheelchair level. Await evaluations RGD       Rodgers Likes, Asberry MATSU

## 2023-11-12 NOTE — Progress Notes (Signed)
 Physical Therapy Discharge Note  Patient Details  Name: Guyla Bless MRN: [id number] Date of Birth: [birth year] Today's Date: 11/12/2023  Physical Therapy Discharge Note  This patient was unable to complete the inpatient rehab program due to change in medical status with worsening SOB; therefore did not meet their long term goals. Pt left the program at a supervision/mod I assist level for their functional mobility/ transfers. This patient is being discharged from PT services at this time.  Pt's perception of pain in the last five days was unable to answer at this time.   See CareTool for functional status details  If the patient is able to return to inpatient rehabilitation within 3 midnights, this may be considered an interrupted stay and therapy services will resume as ordered. Modification and reinstatement of their goals will be made upon completion of therapy service reevaluations.     Therisa HERO Zaunegger Therisa Stains PT, DPT 11/12/2023, 2:44 PM

## 2023-11-12 NOTE — Progress Notes (Signed)
 Occupational Therapy Session Note  Patient Details  Name: Kayla Schmitt MRN: [id number] Date of Birth: [birth year]  Today's Date: 11/12/2023 OT Individual Time: 9194-9169 & 1130-1200 OT Individual Time Calculation (min): 25 min & 30 min & OT missed time: 45 min Missed time reason: fatigue    Short Term Goals: Week 2:  OT Short Term Goal 1 (Week 2): STG = LTG due to ELOS  Skilled Therapeutic Interventions/Progress Updates:  Session 1 Skilled OT intervention completed with focus on activity tolerance, wound care. Pt received seated in w/c, visibly uncomfortable with report of feeling like she can't catch her breath. No pain reported.  EKG staff present, while OT retrieved oxygen  extension cord, for in room mobility, and residual limb dressings due to visible blood drainage on support pad and all over bed. Pt received on 3L O2 via - per nursing pt to be on 1L and unsure how pt got on 3. Lowered pt to 1L. Vitals: HR 107 bpm, O2 90% sat on 1L. Team notified of sat status, though pt not in a head space to trial being on RA due to current discomfort.  Pt doffed L BKA shrinker independently. Donned clean one independently. OT applied kerlix to limb with figure 8 position- with blood noted to soak through the wrap just as it was put on. Applied ACE bandage for compression. Team notified of frequent bleeding in BKA.  Pt remained seated in w/c at sink for oral care, with chair alarm on/activated, and with all needs in reach at end of session.  Session 2 Skilled OT intervention completed with focus on BUE strengthening and activity tolerance. Pt received seated in recliner, agreeable to session. No pain reported.  OT arrived 45 mins late to session upon request of pt due to fatigue and current medical status with SOB; will make up missed mins as able. Discussed with pt and her mother on reducing her therapy frequency due to goals met, inability to tolerate current schedule and total missed  mins. Pt agreeable and appreciative of this.   Seated in recliner, pt completed the following BUE exercises to promote BUE strength/endurance needed for independence with functional transfers and BADLs: (With yellow theraband) 6x2 reps Horizontal abduction Self-anchored bicep flexion each arm Self-anchored tricep extension each arm  Pt asked OT if she could be strapped into w/c with gait belt, since sitting upright in w/c is most comfortable for seating and breathing needs in order to sleep but notices she tips forward frequently. Discussed with pt on strategies for positioning to accommodate sleep in safe manner- unable to tolerate sitting upright in bed, recliner is just ok with w/c preferred. Suggested if she sit in w/c, to elevate bed, ensure brakes are locked and lean head/arms on EOB for trunk support to prevent tipping forward with the supervision of her mother. Pt remained seated in recliner, with chair alarm on/activated, and with all needs in reach at end of session.   Therapy Documentation Precautions:  Precautions Precautions: Fall Precaution/Restrictions Comments: R transmet amputation Restrictions Weight Bearing Restrictions Per Provider Order: Yes LLE Weight Bearing Per Provider Order: Non weight bearing    Therapy/Group: Individual Therapy  Kayla FORBES Fritter, MS, OTR/L  11/12/2023, 12:10 PM

## 2023-11-12 NOTE — Telephone Encounter (Signed)
 Deland went to see her this morning.

## 2023-11-12 NOTE — Progress Notes (Signed)
 Patient ID: Kayla Schmitt, female   DOB: 03-05-1966, 58 y.o.   MRN: [id number]  Have placed order for home O2 via [employer]. Pt breathing better on 2 liters continuous. Medical issues and have let HH and IV infusion know not discharging home today. Mom here to provide support.

## 2023-11-12 NOTE — Progress Notes (Signed)

## 2023-11-13 DIAGNOSIS — J9601 Acute respiratory failure with hypoxia: Secondary | ICD-10-CM

## 2023-11-13 DIAGNOSIS — D649 Anemia, unspecified: Secondary | ICD-10-CM

## 2023-11-13 LAB — GLUCOSE, CAPILLARY
Glucose-Capillary: 167 mg/dL — ABNORMAL HIGH (ref 70–99)
Glucose-Capillary: 200 mg/dL — ABNORMAL HIGH (ref 70–99)
Glucose-Capillary: 150 mg/dL — ABNORMAL HIGH (ref 70–99)
Glucose-Capillary: 170 mg/dL — ABNORMAL HIGH (ref 70–99)

## 2023-11-13 LAB — CBC WITH DIFFERENTIAL/PLATELET
Abs Immature Granulocytes: 0.08 K/uL — ABNORMAL HIGH (ref 0.00–0.07)
Basophils Absolute: 0.1 K/uL (ref 0.0–0.1)
Basophils Relative: 0 %
Eosinophils Absolute: 0.5 K/uL (ref 0.0–0.5)
Eosinophils Relative: 4 %
HCT: 28.8 % — ABNORMAL LOW (ref 36.0–46.0)
Hemoglobin: 9.2 g/dL — ABNORMAL LOW (ref 12.0–15.0)
Immature Granulocytes: 1 %
Lymphocytes Relative: 11 %
Lymphs Abs: 1.6 K/uL (ref 0.7–4.0)
MCH: 28.7 pg (ref 26.0–34.0)
MCHC: 31.9 g/dL (ref 30.0–36.0)
MCV: 89.7 fL (ref 80.0–100.0)
Monocytes Absolute: 1.6 K/uL — ABNORMAL HIGH (ref 0.1–1.0)
Monocytes Relative: 11 %
Neutro Abs: 10.2 K/uL — ABNORMAL HIGH (ref 1.7–7.7)
Neutrophils Relative %: 73 %
Platelets: 571 K/uL — ABNORMAL HIGH (ref 150–400)
RBC: 3.21 MIL/uL — ABNORMAL LOW (ref 3.87–5.11)
RDW: 17.1 % — ABNORMAL HIGH (ref 11.5–15.5)
WBC: 14 K/uL — ABNORMAL HIGH (ref 4.0–10.5)
nRBC: 0 % (ref 0.0–0.2)

## 2023-11-13 LAB — BASIC METABOLIC PANEL WITH GFR
Anion gap: 13 (ref 5–15)
BUN: 37 mg/dL — ABNORMAL HIGH (ref 6–20)
CO2: 24 mmol/L (ref 22–32)
Calcium: 9 mg/dL (ref 8.9–10.3)
Chloride: 98 mmol/L (ref 98–111)
Creatinine, Ser: 1.37 mg/dL — ABNORMAL HIGH (ref 0.44–1.00)
GFR, Estimated: 45 mL/min — ABNORMAL LOW (ref >60)
Glucose, Bld: 176 mg/dL — ABNORMAL HIGH (ref 70–99)
Potassium: 4.3 mmol/L (ref 3.5–5.1)
Sodium: 135 mmol/L (ref 135–145)

## 2023-11-13 LAB — TROPONIN I (HIGH SENSITIVITY): Troponin I (High Sensitivity): 671 ng/L (ref <18)

## 2023-11-13 LAB — BRAIN NATRIURETIC PEPTIDE: B Natriuretic Peptide: 1626.5 pg/mL — ABNORMAL HIGH (ref 0.0–100.0)

## 2023-11-13 MED ORDER — SODIUM CHLORIDE 0.9% FLUSH: 10.0000 mL | INTRAVENOUS | Status: DC | PRN

## 2023-11-13 MED ORDER — METHOCARBAMOL 500 MG PO TABS
500.0000 mg | ORAL_TABLET | Freq: Three times a day (TID) | ORAL | Status: DC | PRN
  Administered 2023-11-13 – 2023-11-18 (×3): 500 mg via ORAL
  Filled 2023-11-13 (×3): qty 1

## 2023-11-13 MED ORDER — INSULIN ASPART 100 UNIT/ML IJ SOLN
0.0000 [IU] | Freq: Three times a day (TID) | INTRAMUSCULAR | Status: DC
  Administered 2023-11-13 (×2): 1 [IU] via SUBCUTANEOUS
  Administered 2023-11-14: 2 [IU] via SUBCUTANEOUS
  Administered 2023-11-14 – 2023-11-15 (×2): 1 [IU] via SUBCUTANEOUS
  Administered 2023-11-15: 2 [IU] via SUBCUTANEOUS
  Administered 2023-11-15: 1 [IU] via SUBCUTANEOUS

## 2023-11-13 MED ORDER — ALBUMIN HUMAN 25 % IV SOLN
25.0000 g | INTRAVENOUS | Status: AC
  Administered 2023-11-13: 25 g via INTRAVENOUS
  Filled 2023-11-13: qty 100

## 2023-11-13 MED ORDER — FUROSEMIDE 10 MG/ML IJ SOLN
60.0000 mg | Freq: Three times a day (TID) | INTRAMUSCULAR | Status: DC
  Administered 2023-11-13 (×3): 60 mg via INTRAVENOUS
  Filled 2023-11-13 (×3): qty 6

## 2023-11-13 MED ORDER — OXYCODONE-ACETAMINOPHEN 5-325 MG PO TABS
1.0000 | ORAL_TABLET | Freq: Four times a day (QID) | ORAL | Status: DC | PRN
  Administered 2023-11-17 – 2023-11-18 (×3): 1 via ORAL
  Filled 2023-11-13 (×3): qty 1

## 2023-11-13 MED ORDER — SODIUM CHLORIDE 0.9% FLUSH
10.0000 mL | Freq: Two times a day (BID) | INTRAVENOUS | Status: DC
  Administered 2023-11-13 – 2023-11-14 (×3): 10 mL
  Administered 2023-11-15: 40 mL
  Administered 2023-11-15: 10 mL
  Administered 2023-11-16: 30 mL
  Administered 2023-11-17: 40 mL
  Administered 2023-11-17 – 2023-11-19 (×4): 10 mL
  Administered 2023-11-19: 30 mL
  Administered 2023-11-20 – 2023-11-29 (×16): 10 mL
  Administered 2023-11-30 – 2023-12-01 (×2): 40 mL
  Administered 2023-12-01 – 2023-12-04 (×7): 10 mL
  Administered 2023-12-05: 40 mL
  Administered 2023-12-05: 10 mL
  Administered 2023-12-06: 20 mL

## 2023-11-13 MED ORDER — HEPARIN SODIUM (PORCINE) 5000 UNIT/ML IJ SOLN
5000.0000 [IU] | Freq: Three times a day (TID) | INTRAMUSCULAR | Status: DC
  Administered 2023-11-13 – 2023-11-25 (×36): 5000 [IU] via SUBCUTANEOUS
  Filled 2023-11-13 (×35): qty 1

## 2023-11-13 MED ORDER — MIDODRINE HCL 5 MG PO TABS
10.0000 mg | ORAL_TABLET | Freq: Once | ORAL | Status: AC
  Administered 2023-11-13: 10 mg via ORAL
  Filled 2023-11-13: qty 2

## 2023-11-13 MED ORDER — MIDODRINE HCL 5 MG PO TABS: 5.0000 mg | ORAL_TABLET | Freq: Once | ORAL | Status: DC

## 2023-11-13 MED ORDER — CHLORHEXIDINE GLUCONATE CLOTH 2 % EX PADS
6.0000 | MEDICATED_PAD | Freq: Every day | CUTANEOUS | Status: DC
  Administered 2023-11-13 – 2023-12-06 (×22): 6 via TOPICAL

## 2023-11-13 NOTE — Progress Notes (Signed)
 Inpatient Rehab Admissions Coordinator:  Note OT recommending CIR. Per Dr. Lorilee, pt had met CIR goals prior to readmission to acute care. Pt does not warrant readmission to CIR. Other rehab venues should be pursued. TOC made aware.   Tinnie Yvone Cohens, MS, CCC-SLP Admissions Coordinator [phone]

## 2023-11-13 NOTE — Progress Notes (Signed)
 [facility] night cross cover note:   I was notified by the patient's RN  that this pt has h/o diabetes. Will add orders for cbg monitoring with low dose SSI now.      Eva Pore, DO Hospitalist

## 2023-11-13 NOTE — Evaluation (Signed)
 Occupational Therapy Evaluation Patient Details Name: Kayla Schmitt MRN: [id number] DOB: 05-Jul-1965 Today's Date: 11/13/2023   History of Present Illness   Pt is a 58 y.o female admitted 6/22 for lethargy & LLE pain. Sepsis protocol initiated. Negative for osteomyelitis & DVT. S/p L BKA. D/c to CIR 7/1 transferred back to acute care for SOB. CHF exacerbation. PMH: CAD, aortic stenosis, HTN, CHF, PAD s/p bilateral stent placement, bil metatarsal amputation, HLD, hypothyroidsim.     Clinical Impressions Pt admitted based on above, and was seen based on problem list below. Prior to L BKA, pt was mod I with ADLs, since procedure, pt reports requiring supervision assistance for ADLs. Today pt is requiring set up  to min  assist for  ADLs. Functional transfers are  min assist with cues for sequencing. Pt presenting with decreased activity tolerance, requiring x2 rest breaks during session. Pt eager to improve and progress. Pt would greatly benefit from >3 hours of skilled rehab daily. OT will continue to follow acutely to maximize functional independence.        If plan is discharge home, recommend the following:   A lot of help with walking and/or transfers;A lot of help with bathing/dressing/bathroom     Functional Status Assessment   Patient has had a recent decline in their functional status and demonstrates the ability to make significant improvements in function in a reasonable and predictable amount of time.     Equipment Recommendations   Other (comment) (Defer to next venue)     Recommendations for Other Services   Rehab consult     Precautions/Restrictions   Precautions Precautions: Fall Recall of Precautions/Restrictions: Intact Required Braces or Orthoses: Other Brace Other Brace: Limb guard Restrictions Weight Bearing Restrictions Per Provider Order: Yes LLE Weight Bearing Per Provider Order: Non weight bearing     Mobility Bed Mobility Overal  bed mobility: Needs Assistance             General bed mobility comments: Receieved sitting EOB    Transfers Overall transfer level: Needs assistance Equipment used: Rolling walker (2 wheels), 2 person hand held assist Transfers: Sit to/from Stand, Bed to chair/wheelchair/BSC Sit to Stand: Min assist   Squat pivot transfers: Min assist, +2 safety/equipment       General transfer comment: Cues for hand placement and sequencing transfer      Balance Overall balance assessment: Needs assistance Sitting-balance support: No upper extremity supported, Feet supported Sitting balance-Leahy Scale: Good     Standing balance support: Bilateral upper extremity supported, Reliant on assistive device for balance, During functional activity Standing balance-Leahy Scale: Poor Standing balance comment: Reliant on RW       ADL either performed or assessed with clinical judgement   ADL Overall ADL's : Needs assistance/impaired Eating/Feeding: Set up;Sitting   Grooming: Set up;Sitting   Upper Body Bathing: Set up;Sitting   Lower Body Bathing: Minimal assistance;Sitting/lateral leans Lower Body Bathing Details (indicate cue type and reason): Good ability to wt shift EOB Upper Body Dressing : Set up;Sitting   Lower Body Dressing: Minimal assistance;Sitting/lateral leans Lower Body Dressing Details (indicate cue type and reason): Wt shifting or hip bridging Toilet Transfer: Squat-pivot;+2 for safety/equipment;Minimal Surveyor, quantity Details (indicate cue type and reason): Min assist for squat pivot to BSC, +2 for safety without a drop arm Toileting- Clothing Manipulation and Hygiene: Contact guard assist;Sitting/lateral lean       Functional mobility during ADLs: Minimal assistance General ADL Comments: Good use of compensatory strategies  Vision Baseline Vision/History: 0 No visual deficits Vision Assessment?: No apparent visual deficits             Pertinent Vitals/Pain Pain Assessment Pain Assessment: Faces Faces Pain Scale: Hurts a little bit Pain Location: LLE Pain Descriptors / Indicators: Discomfort, Grimacing, Guarding, Operative site guarding Pain Intervention(s): Monitored during session     Extremity/Trunk Assessment Upper Extremity Assessment Upper Extremity Assessment: Generalized weakness   Lower Extremity Assessment Lower Extremity Assessment: Defer to PT evaluation   Cervical / Trunk Assessment Cervical / Trunk Assessment: Normal   Communication Communication Communication: No apparent difficulties   Cognition Arousal: Alert Behavior During Therapy: WFL for tasks assessed/performed Cognition: No apparent impairments       Following commands: Intact       Cueing  General Comments   Cueing Techniques: Verbal cues  Mother present for session and supportive           Home Living Family/patient expects to be discharged to:: Private residence Living Arrangements: Alone Available Help at Discharge: Family;Available 24 hours/day Type of Home: House Home Access: Ramped entrance     Home Layout: One level     Bathroom Shower/Tub: Walk-in shower;Curtain   Bathroom Toilet: Handicapped height Bathroom Accessibility: Yes How Accessible: Accessible via wheelchair;Accessible via walker Home Equipment: Wheelchair - Forensic psychologist (2 wheels);Other (comment);Shower seat;BSC/3in1;Rollator (4 wheels)   Additional Comments: Pt recently d/c from CIR, reporting preference to return      Prior Functioning/Environment Prior Level of Function : Independent/Modified Independent     Mobility Comments: Prior to BKA pt was working 5 days a week using cane, driving. At [facility] pt supervision CGA for lateral scoot transfers ADLs Comments: Prior to BKA pt was mod I. At CIR pt was CGA supervision for ADLs    OT Problem List: Decreased strength;Decreased range of motion;Decreased activity  tolerance;Impaired balance (sitting and/or standing);Decreased safety awareness;Decreased knowledge of use of DME or AE;Cardiopulmonary status limiting activity   OT Treatment/Interventions: Self-care/ADL training;Therapeutic exercise;Energy conservation;DME and/or AE instruction;Therapeutic activities;Patient/family education;Balance training      OT Goals(Current goals can be found in the care plan section)   Acute Rehab OT Goals Patient Stated Goal: None stated OT Goal Formulation: With patient Time For Goal Achievement: 11/27/23 Potential to Achieve Goals: Good   OT Frequency:  Min 2X/week       AM-PAC OT 6 Clicks Daily Activity     Outcome Measure Help from another person eating meals?: None Help from another person taking care of personal grooming?: A Little Help from another person toileting, which includes using toliet, bedpan, or urinal?: A Lot Help from another person bathing (including washing, rinsing, drying)?: A Lot Help from another person to put on and taking off regular upper body clothing?: A Little Help from another person to put on and taking off regular lower body clothing?: A Lot 6 Click Score: 16   End of Session Equipment Utilized During Treatment: Gait belt;Rolling walker (2 wheels);Oxygen  Nurse Communication: Mobility status  Activity Tolerance: Patient tolerated treatment well Patient left: with nursing/sitter in room;with call bell/phone within reach;with family/visitor present (On BSC RN present)  OT Visit Diagnosis: Unsteadiness on feet (R26.81);Other abnormalities of gait and mobility (R26.89);Muscle weakness (generalized) (M62.81)                Time: 8778-8751 OT Time Calculation (min): 27 min Charges:  OT General Charges $OT Visit: 1 Visit OT Evaluation $OT Eval Moderate Complexity: 1 Mod OT Treatments $Self Care/Home Management : 8-22 mins  Evander Macaraeg C, OT  Acute Rehabilitation Services Office [phone] Secure chat  preferred   Adrianne GORMAN Savers 11/13/2023, 1:51 PM

## 2023-11-13 NOTE — Progress Notes (Signed)
 [facility] night cross cover note:  I completed the adult central line maintenance order set for this patient with existing PICC line.      Eva Pore, DO Hospitalist

## 2023-11-13 NOTE — Progress Notes (Signed)
 Progress Note Patient Name: Kayla Schmitt Date of Encounter: 11/13/2023 [facility] Cardiologist: Vishnu P Mallipeddi, MD   Interval Summary   BP 110/65.  Mild bump in creatinine (1.28 > 1.37).  BNP 1626, troponin 49 > 554 > 671.  Hemoglobin 9.2.  No I/O's recorded.  She reports dyspnea improved today, she is on 7 L HFNC   Vital Signs Vitals:   11/12/23 1800 11/12/23 2359 11/13/23 0448 11/13/23 0933  BP: 117/72 (!) 98/58 112/66 110/65  Pulse: (!) 123 88 100   Resp: (!) 25 20 16 17   Temp: 98 F (36.7 C) 98.3 F (36.8 C) 98.7 F (37.1 C)   TempSrc: Oral Oral Oral   SpO2: 94% 97% 94% 95%  Weight: 92.5 kg  91.7 kg   Height: 5' 4 (1.626 m)      No intake or output data in the 24 hours ending 11/13/23 1016     11/13/2023    4:48 AM 11/12/2023    6:00 PM 11/12/2023    5:00 AM  Last 3 Weights  Weight (lbs) 202 lb 2.6 oz 203 lb 14.8 oz 203 lb 14.8 oz  Weight (kg) 91.7 kg 92.5 kg 92.5 kg      Telemetry/ECG  No telemetry available- Personally Reviewed EKG sinus tachycardia, rate 105, LVH, poor R wave progression, inferior Q waves  Physical Exam  GEN: No acute distress.   Neck: No JVD Cardiac: RRR, 3/6 systolic murmur Respiratory: Clear to auscultation bilaterally. GI: Soft, nontender, non-distended  MS: 1+ edema right lower extremity, left BKA, right transmetatarsal amputation  Assessment & Plan  87-year female with history of PAD, CAD, MR, T2DM, hypertension we are consulted for evaluation of heart failure  Acute on chronic diastolic heart failure: Presented with volume overload and, had worsening lower extremity edema and dyspnea.  Has been diuresed with IV Lasix .   - Recommend strict I/O's and daily weights  - Continue IV Lasix .  Suspect hypoalbuminemia (2.0) contributing to edema, she received albumin  yesterday  CAD: Admitted with NSTEMI 07/2022.  Cath showed severe multivessel disease.  Severe MR on echo.  Had plan for CABG/MVR but this was postponed  given multiple ongoing medical issues. - Continue statin - Recommended switching from Plavix  to aspirin  81 mg daily for bleeding risk as has required multiple transfusions but her preference is to stay on plavix .  Suggest continuing aspirin  today and if stable hemoglobin tomorrow could switch to Plavix  - Mild troponin elevation, not currently a candidate for anticoagulation or heart catheterization in setting of her anemia requiring transfusion.  Could represent demand ischemia in setting of her anemia and decompensated heart failure  Acute hypoxic respiratory failure: On 7 L HFNC.  Suspect acute on chronic diastolic heart failure as above.  Continue diuresis as above   Aortic stenosis: Moderate to severe by TEE 09/2023  Mild regurgitation: Severe by echo in 2024.  TEE 09/2023 showed mild to moderate MR  MRSA bacteremia: Hospitalized 09/2023 with MRSA bacteremia.  TEE 10/24/2023 showed moderate MR, moderate to severe aortic stenosis, no endocarditis.  She had necrotizing fasciitis, underwent left below the knee amputation.  Anemia: Hemoglobin down to 7.7 on 7/14. Recommend transfuse for goal Hgb>8 given cardiac issues.  Suggested switching from plavix  to ASA 81 mg daily to decrease bleeding risk but she wishes to remain on Plavix , though agreeable to switching to aspirin  until ensure no further bleeding.  Received 1 unit PRBCs on 7/14, Hgb 9.2 today  For questions or updates, please  contact [facility] Please consult [email or web address] for contact info under       Signed, Lonni LITTIE Nanas, MD

## 2023-11-13 NOTE — Plan of Care (Signed)
 Patient's RN reported that patient is complaining about significant pain of the lower extremities and requesting for Robaxin  and Percocet.  Also patient blood pressure is soft 96/52. -Due to soft blood pressure recommending to hold the nighttime dose of IV Lasix  60 mg.  Giving albumin  and oral midodrine  10 mg. -Due to significant pain around the amputation site starting Robaxin  and Percocet as needed.

## 2023-11-13 NOTE — Progress Notes (Signed)
 PROGRESS NOTE    Kayla Schmitt  FMW:[id number] DOB: 01-15-1966 DOA: 11/12/2023 PCP: Kayla Jacquline NOVAK, NP  57/F with multivessel CAD, severe aortic stenosis, mitral regurgitation, chronic combined CHF, PAD, hypertension, GI bleed, recent hospitalization for CHF as well as necrotizing fasciitis of left leg, underwent BKA, discharged to CIR on 7/1, over the past week has had progressive dyspnea on exertion, increased swelling, briefly diuretics held for worsening renal function and received PRBC transfusion as well on 7/15 had worsening dyspnea with hypoxia, troponin went from 489 to 671 and admitted to ED [facility] from CIR, she is also on IV daptomycin  for MRSA bacteremia.   Subjective: -Complains of dyspnea, significant swelling in right lower leg - Uncomfortable with new potty chair  Assessment and Plan:  Acute on chronic diastolic (congestive) heart failure (HCC) Moderate mitral regurgitation Moderate to severe aortic stenosis -CHF complicated by hypoalbuminemia and third spacing as well -Continue IV Lasix  today, increased dose, may need repeat albumin  tomorrow -Last echo 6/26 with preserved EF, normal RV, moderate to severe aortic stenosis -Add Aldactone if creatinine remains stable, poor candidate for SGLT2i  Severe multivessel CAD -Had original plans for CABG/MVR however this has been postponed on account of multiple ongoing active issues -Continue aspirin , Plavix  on hold at this time with worsening anemia -Suspect demand ischemia in the setting of CHF, anemia   MRSA bacteremia Necrotizing fasciitis (HCC) S/P BKA (below knee amputation) unilateral, left (HCC) Seen by ID during recent hospitalization, plan to continue daptomycin  till 7/20 and follow-up with Dr. Dea, echo was negative for endocarditis -Started on IV Unasyn  as well yesterday by Dr. Tobie for worsening leukocytosis -Follow-up blood cultures -, De-escalate antibiotics soon as tolerated   Ulcerated,  foot, right, limited to breakdown of skin (HCC) No evidence of active infection.  Appears to be chronic.   PAD Seen by vascular surgery during recent admission. On Plavix .  Now switched to aspirin  given risk for bleeding.   Insulin  dependent type 2 diabetes mellitus (HCC) On Semglee  10 units for now. Continue sliding scale insulin .   GERD (gastroesophageal reflux disease) Continue PPI.   Hyperlipidemia Continue statin.   Hypertension Stable   Hypothyroidism Continue Synthroid .   Anemia Iron  deficiency anemia Transfused 1 unit of PRBC yesterday, anemia panel with iron  deficiency, add IV iron  tomorrow   AVM (arteriovenous malformation) of small bowel, acquired Prior history.  Noted.  Monitor.   Anxiety state Continue current regimen.    DVT prophylaxis: Code Status:  Family Communication: Disposition Plan:   Consultants:    Procedures:   Antimicrobials:    Objective: Vitals:   11/12/23 1800 11/12/23 2359 11/13/23 0448 11/13/23 0933  BP: 117/72 (!) 98/58 112/66 110/65  Pulse: (!) 123 88 100   Resp: (!) 25 20 16 17   Temp: 98 F (36.7 C) 98.3 F (36.8 C) 98.7 F (37.1 C)   TempSrc: Oral Oral Oral   SpO2: 94% 97% 94% 95%  Weight: 92.5 kg  91.7 kg   Height: 5' 4 (1.626 m)      No intake or output data in the 24 hours ending 11/13/23 1055 Filed Weights   11/12/23 1800 11/13/23 0448  Weight: 92.5 kg 91.7 kg    Examination:  General exam: Appears calm and comfortable  Respiratory system: Decreased breath sounds at the bases Cardiovascular system: S1 & S2 heard, RRR.  Systolic murmur Abd: nondistended, soft and nontender.Normal bowel sounds heard. Central nervous system: Alert and oriented. No focal neurological deficits. Extremities: Left BKA, 2+  edema in right lower extremity, right foot transmetatarsal amputation Skin: No rashes Psychiatry:  Mood & affect appropriate.     Data Reviewed:   CBC: Recent Labs  Lab 11/08/23 0410  11/09/23 0453 11/10/23 0305 11/11/23 0601 11/12/23 0405 11/13/23 0520  WBC 7.2 8.6 10.3 9.7 11.4* 14.0*  NEUTROABS 4.0  --  6.8  --  7.7 10.2*  HGB 7.3* 8.0* 8.2* 7.7* 9.1* 9.2*  HCT 23.4* 25.3* 26.4* 24.7* 29.5* 28.8*  MCV 92.9 90.0 90.4 91.5 91.9 89.7  PLT 301 318 386 405* 496* 571*   Basic Metabolic Panel: Recent Labs  Lab 11/09/23 0453 11/10/23 0305 11/11/23 0601 11/12/23 0405 11/13/23 0520  NA 139 136 135 135 135  K 3.9 4.0 4.0 4.4 4.3  CL 101 100 100 100 98  CO2 27 26 26 23 24   GLUCOSE 117* 149* 172* 181* 176*  BUN 35* 32* 32* 34* 37*  CREATININE 1.27* 1.33* 1.28* 1.28* 1.37*  CALCIUM  8.6* 8.6* 8.5* 8.7* 9.0   GFR: Estimated Creatinine Clearance: 50.3 mL/min (A) (by C-G formula based on SCr of 1.37 mg/dL (H)). Liver Function Tests: Recent Labs  Lab 11/07/23 0434 11/12/23 1921  AST  --  24  ALT  --  19  ALKPHOS  --  115  BILITOT  --  0.9  PROT  --  8.0  ALBUMIN  2.0* 2.2*   No results for input(s): LIPASE, AMYLASE in the last 168 hours. No results for input(s): AMMONIA in the last 168 hours. Coagulation Profile: Recent Labs  Lab 11/12/23 1921  INR 1.3*   Cardiac Enzymes: Recent Labs  Lab 11/11/23 0601  CKTOTAL 106   BNP (last 3 results) No results for input(s): PROBNP in the last 8760 hours. HbA1C: No results for input(s): HGBA1C in the last 72 hours. CBG: Recent Labs  Lab 11/11/23 1633 11/12/23 0530 11/12/23 1125 11/12/23 1521 11/13/23 0600  GLUCAP 152* 172* 157* 187* 167*   Lipid Profile: No results for input(s): CHOL, HDL, LDLCALC, TRIG, CHOLHDL, LDLDIRECT in the last 72 hours. Thyroid Function Tests: No results for input(s): TSH, T4TOTAL, FREET4, T3FREE, THYROIDAB in the last 72 hours. Anemia Panel: Recent Labs    11/12/23 1921  VITAMINB12 658  FOLATE 9.6  FERRITIN 989*  TIBC 162*  IRON  18*  RETICCTPCT 2.6   Urine analysis:    Component Value Date/Time   COLORURINE YELLOW 10/22/2023 0609    APPEARANCEUR CLOUDY (A) 10/22/2023 0609   LABSPEC 1.016 10/22/2023 0609   PHURINE 5.0 10/22/2023 0609   GLUCOSEU NEGATIVE 10/22/2023 0609   HGBUR SMALL (A) 10/22/2023 0609   BILIRUBINUR NEGATIVE 10/22/2023 0609   KETONESUR NEGATIVE 10/22/2023 0609   PROTEINUR NEGATIVE 10/22/2023 0609   NITRITE NEGATIVE 10/22/2023 0609   LEUKOCYTESUR NEGATIVE 10/22/2023 0609   Sepsis Labs: @LABRCNTIP (procalcitonin:4,lacticidven:4)  ) Recent Results (from the past 240 hours)  Culture, blood (Routine X 2) w Reflex to ID Panel     Status: None (Preliminary result)   Collection Time: 11/12/23  7:21 PM   Specimen: BLOOD RIGHT ARM  Result Value Ref Range Status   Specimen Description BLOOD RIGHT ARM  Final   Special Requests   Final    BOTTLES DRAWN AEROBIC AND ANAEROBIC Blood Culture results may not be optimal due to an inadequate volume of blood received in culture bottles   Culture   Final    NO GROWTH < 12 HOURS Performed at [facility], [address], [place], [place] [address]    Report Status  PENDING  Incomplete  Culture, blood (Routine X 2) w Reflex to ID Panel     Status: None (Preliminary result)   Collection Time: 11/12/23  7:21 PM   Specimen: BLOOD RIGHT HAND  Result Value Ref Range Status   Specimen Description BLOOD RIGHT HAND  Final   Special Requests   Final    BOTTLES DRAWN AEROBIC AND ANAEROBIC Blood Culture results may not be optimal due to an inadequate volume of blood received in culture bottles   Culture   Final    NO GROWTH < 12 HOURS Performed at [facility], [address], [place], [place] [address]    Report Status PENDING  Incomplete     Radiology Studies: DG Chest 2 View Result Date: 11/11/2023 CLINICAL DATA:  Shortness of breath. EXAM: CHEST - 2 VIEW COMPARISON:  November 07, 2023. FINDINGS: Stable cardiomediastinal silhouette. Left-sided PICC line is noted with distal tip looped within SVC. Increased bilateral patchy and interstitial airspace opacities  are noted suggesting worsening pneumonia or pulmonary edema. Small pleural effusions are noted. Bony thorax is unremarkable. IMPRESSION: Significantly increased bilateral lung opacities are noted suggesting worsening pneumonia or edema with small pleural effusions. Electronically Signed   By: Lynwood Landy Raddle M.D.   On: 11/11/2023 15:08     Scheduled Meds:  aspirin  EC  81 mg Oral Daily   atorvastatin   40 mg Oral Daily   Chlorhexidine  Gluconate Cloth  6 each Topical Daily   ferrous sulfate   325 mg Oral Q breakfast   furosemide   60 mg Intravenous TID   gabapentin   300 mg Oral QHS   insulin  aspart  0-6 Units Subcutaneous TID WC   levothyroxine   150 mcg Oral QAC breakfast   pantoprazole   40 mg Oral Daily   sodium chloride  flush  10-40 mL Intracatheter Q12H   Continuous Infusions:  ampicillin -sulbactam (UNASYN ) IV 3 g (11/13/23 0959)   DAPTOmycin        LOS: 1 day    Time spent:    Sigurd Pac, MD [facility]   11/13/2023, 10:55 AM

## 2023-11-13 NOTE — Progress Notes (Signed)
 PT Cancellation Note  Patient Details Name: Kayla Schmitt MRN: [id number] DOB: 1966/03/26   Cancelled Treatment:    Reason Eval/Treat Not Completed: Other (comment). Pt dyspneic with minimal activity. Feel it will be more productive for PT eval tomorrow.    Rodgers ORN [facility] 11/13/2023, 4:13 PM Rodgers Opal PT Acute [employer] [phone]

## 2023-11-13 NOTE — Plan of Care (Signed)
   Problem: Clinical Measurements: Goal: Will remain free from infection Outcome: Progressing   Problem: Clinical Measurements: Goal: Diagnostic test results will improve Outcome: Progressing   Problem: Clinical Measurements: Goal: Respiratory complications will improve Outcome: Progressing

## 2023-11-14 ENCOUNTER — Inpatient Hospital Stay (HOSPITAL_COMMUNITY)

## 2023-11-14 ENCOUNTER — Encounter (HOSPITAL_COMMUNITY)
Admission: EM | Disposition: A | Discharge status: Home Health | Payer: Self-pay | Source: Skilled Nursing Facility | Attending: Internal Medicine

## 2023-11-14 ENCOUNTER — Inpatient Hospital Stay: Admitting: Infectious Diseases

## 2023-11-14 ENCOUNTER — Other Ambulatory Visit: Payer: Self-pay

## 2023-11-14 ENCOUNTER — Encounter (HOSPITAL_COMMUNITY): Payer: Self-pay | Admitting: Internal Medicine

## 2023-11-14 DIAGNOSIS — I255 Ischemic cardiomyopathy: Secondary | ICD-10-CM

## 2023-11-14 DIAGNOSIS — E119 Type 2 diabetes mellitus without complications: Secondary | ICD-10-CM

## 2023-11-14 DIAGNOSIS — J9601 Acute respiratory failure with hypoxia: Secondary | ICD-10-CM

## 2023-11-14 DIAGNOSIS — N179 Acute kidney failure, unspecified: Secondary | ICD-10-CM

## 2023-11-14 DIAGNOSIS — I2510 Atherosclerotic heart disease of native coronary artery without angina pectoris: Secondary | ICD-10-CM

## 2023-11-14 DIAGNOSIS — N189 Chronic kidney disease, unspecified: Secondary | ICD-10-CM

## 2023-11-14 DIAGNOSIS — I509 Heart failure, unspecified: Secondary | ICD-10-CM

## 2023-11-14 LAB — COOXEMETRY PANEL
Carboxyhemoglobin: 1.8 % — ABNORMAL HIGH (ref 0.5–1.5)
Methemoglobin: <0.7 % (ref 0.0–1.5)
O2 Saturation: 85.5 %
Total hemoglobin: 8.8 g/dL — ABNORMAL LOW (ref 12.0–16.0)

## 2023-11-14 LAB — BASIC METABOLIC PANEL WITH GFR
Anion gap: 15 (ref 5–15)
BUN: 40 mg/dL — ABNORMAL HIGH (ref 6–20)
CO2: 23 mmol/L (ref 22–32)
Calcium: 8.8 mg/dL — ABNORMAL LOW (ref 8.9–10.3)
Chloride: 98 mmol/L (ref 98–111)
Creatinine, Ser: 1.56 mg/dL — ABNORMAL HIGH (ref 0.44–1.00)
GFR, Estimated: 39 mL/min — ABNORMAL LOW (ref >60)
Glucose, Bld: 178 mg/dL — ABNORMAL HIGH (ref 70–99)
Potassium: 3.5 mmol/L (ref 3.5–5.1)
Sodium: 136 mmol/L (ref 135–145)

## 2023-11-14 LAB — POCT I-STAT 7, (LYTES, BLD GAS, ICA,H+H)
Acid-base deficit: 1 mmol/L (ref 0.0–2.0)
Bicarbonate: 25.9 mmol/L (ref 20.0–28.0)
Calcium, Ion: 1.23 mmol/L (ref 1.15–1.40)
HCT: 25 % — ABNORMAL LOW (ref 36.0–46.0)
Hemoglobin: 8.5 g/dL — ABNORMAL LOW (ref 12.0–15.0)
O2 Saturation: 99 %
Patient temperature: 98.7
Potassium: 3.7 mmol/L (ref 3.5–5.1)
Sodium: 138 mmol/L (ref 135–145)
TCO2: 28 mmol/L (ref 22–32)
pCO2 arterial: 56.9 mmHg — ABNORMAL HIGH (ref 32–48)
pH, Arterial: 7.266 — ABNORMAL LOW (ref 7.35–7.45)
pO2, Arterial: 172 mmHg — ABNORMAL HIGH (ref 83–108)

## 2023-11-14 LAB — COMPREHENSIVE METABOLIC PANEL WITH GFR
ALT: 16 U/L (ref 0–44)
AST: 16 U/L (ref 15–41)
Albumin: 2.2 g/dL — ABNORMAL LOW (ref 3.5–5.0)
Alkaline Phosphatase: 120 U/L (ref 38–126)
Anion gap: 12 (ref 5–15)
BUN: 45 mg/dL — ABNORMAL HIGH (ref 6–20)
CO2: 24 mmol/L (ref 22–32)
Calcium: 8.6 mg/dL — ABNORMAL LOW (ref 8.9–10.3)
Chloride: 100 mmol/L (ref 98–111)
Creatinine, Ser: 1.78 mg/dL — ABNORMAL HIGH (ref 0.44–1.00)
GFR, Estimated: 33 mL/min — ABNORMAL LOW (ref >60)
Glucose, Bld: 205 mg/dL — ABNORMAL HIGH (ref 70–99)
Potassium: 3.6 mmol/L (ref 3.5–5.1)
Sodium: 136 mmol/L (ref 135–145)
Total Bilirubin: 0.7 mg/dL (ref 0.0–1.2)
Total Protein: 7.2 g/dL (ref 6.5–8.1)

## 2023-11-14 LAB — CBC
HCT: 28.9 % — ABNORMAL LOW (ref 36.0–46.0)
Hemoglobin: 9 g/dL — ABNORMAL LOW (ref 12.0–15.0)
MCH: 28.1 pg (ref 26.0–34.0)
MCHC: 31.1 g/dL (ref 30.0–36.0)
MCV: 90.3 fL (ref 80.0–100.0)
Platelets: 510 K/uL — ABNORMAL HIGH (ref 150–400)
RBC: 3.2 MIL/uL — ABNORMAL LOW (ref 3.87–5.11)
RDW: 16.6 % — ABNORMAL HIGH (ref 11.5–15.5)
WBC: 13.2 K/uL — ABNORMAL HIGH (ref 4.0–10.5)
nRBC: 0 % (ref 0.0–0.2)

## 2023-11-14 LAB — GLUCOSE, CAPILLARY
Glucose-Capillary: 182 mg/dL — ABNORMAL HIGH (ref 70–99)
Glucose-Capillary: 171 mg/dL — ABNORMAL HIGH (ref 70–99)
Glucose-Capillary: 203 mg/dL — ABNORMAL HIGH (ref 70–99)
Glucose-Capillary: 193 mg/dL — ABNORMAL HIGH (ref 70–99)

## 2023-11-14 LAB — HAPTOGLOBIN: Haptoglobin: 586 mg/dL — ABNORMAL HIGH (ref 33–346)

## 2023-11-14 LAB — PROCALCITONIN: Procalcitonin: 1.36 ng/mL

## 2023-11-14 LAB — LACTIC ACID, PLASMA: Lactic Acid, Venous: 1 mmol/L (ref 0.5–1.9)

## 2023-11-14 LAB — MAGNESIUM: Magnesium: 1.8 mg/dL (ref 1.7–2.4)

## 2023-11-14 LAB — MRSA NEXT GEN BY PCR, NASAL: MRSA by PCR Next Gen: NOT DETECTED

## 2023-11-14 SURGERY — INVASIVE LAB ABORTED CASE
Anesthesia: LOCAL

## 2023-11-14 MED ORDER — DEXMEDETOMIDINE HCL IN NACL 400 MCG/100ML IV SOLN
0.0000 ug/kg/h | INTRAVENOUS | Status: DC
  Administered 2023-11-14: 0.8 ug/kg/h via INTRAVENOUS
  Administered 2023-11-14: 0.4 ug/kg/h via INTRAVENOUS
  Administered 2023-11-15 (×2): 1.2 ug/kg/h via INTRAVENOUS
  Filled 2023-11-14 (×4): qty 100

## 2023-11-14 MED ORDER — ETOMIDATE 2 MG/ML IV SOLN
INTRAVENOUS | Status: AC
  Administered 2023-11-14: 20 mg
  Filled 2023-11-14: qty 20

## 2023-11-14 MED ORDER — POLYETHYLENE GLYCOL 3350 17 G PO PACK
17.0000 g | PACK | Freq: Two times a day (BID) | ORAL | Status: DC
  Administered 2023-11-14 (×2): 17 g via ORAL
  Filled 2023-11-14 (×3): qty 1

## 2023-11-14 MED ORDER — SUCCINYLCHOLINE CHLORIDE 200 MG/10ML IV SOSY
PREFILLED_SYRINGE | INTRAVENOUS | Status: AC
  Filled 2023-11-14: qty 10

## 2023-11-14 MED ORDER — SENNOSIDES-DOCUSATE SODIUM 8.6-50 MG PO TABS
1.0000 | ORAL_TABLET | Freq: Two times a day (BID) | ORAL | Status: DC
  Administered 2023-11-14 (×2): 1 via ORAL
  Filled 2023-11-14 (×3): qty 1

## 2023-11-14 MED ORDER — DOBUTAMINE-DEXTROSE 4-5 MG/ML-% IV SOLN
2.5000 ug/kg/min | INTRAVENOUS | Status: DC
  Administered 2023-11-14: 2.5 ug/kg/min via INTRAVENOUS
  Administered 2023-11-16 – 2023-11-17 (×2): 5 ug/kg/min via INTRAVENOUS
  Administered 2023-11-20: 2.5 ug/kg/min via INTRAVENOUS
  Filled 2023-11-14 (×4): qty 250

## 2023-11-14 MED ORDER — FUROSEMIDE 10 MG/ML IJ SOLN
30.0000 mg/h | INTRAVENOUS | Status: DC
  Administered 2023-11-14: 20 mg/h via INTRAVENOUS
  Administered 2023-11-15 (×2): 30 mg/h via INTRAVENOUS
  Administered 2023-11-15: 20 mg/h via INTRAVENOUS
  Administered 2023-11-16 (×2): 30 mg/h via INTRAVENOUS
  Filled 2023-11-14 (×7): qty 20

## 2023-11-14 MED ORDER — POTASSIUM CHLORIDE CRYS ER 20 MEQ PO TBCR
40.0000 meq | EXTENDED_RELEASE_TABLET | Freq: Once | ORAL | Status: AC
  Administered 2023-11-14: 40 meq via ORAL
  Filled 2023-11-14: qty 2

## 2023-11-14 MED ORDER — POTASSIUM CHLORIDE 20 MEQ PO PACK
40.0000 meq | PACK | Freq: Once | ORAL | Status: AC
  Administered 2023-11-14: 40 meq
  Filled 2023-11-14: qty 2

## 2023-11-14 MED ORDER — KETAMINE HCL 50 MG/5ML IJ SOSY
PREFILLED_SYRINGE | INTRAMUSCULAR | Status: DC
Start: 2023-11-14 — End: 2023-11-14
  Filled 2023-11-14: qty 10

## 2023-11-14 MED ORDER — FENTANYL 2500MCG IN NS 250ML (10MCG/ML) PREMIX INFUSION
0.0000 ug/h | INTRAVENOUS | Status: DC
  Administered 2023-11-14 – 2023-11-15 (×2): 25 ug/h via INTRAVENOUS
  Filled 2023-11-14 (×2): qty 250

## 2023-11-14 MED ORDER — FUROSEMIDE 10 MG/ML IJ SOLN
80.0000 mg | Freq: Two times a day (BID) | INTRAMUSCULAR | Status: DC
  Administered 2023-11-14: 80 mg via INTRAVENOUS
  Filled 2023-11-14: qty 8

## 2023-11-14 MED ORDER — ROCURONIUM BROMIDE 10 MG/ML (PF) SYRINGE
PREFILLED_SYRINGE | INTRAVENOUS | Status: AC
  Administered 2023-11-14: 100 mg
  Filled 2023-11-14: qty 10

## 2023-11-14 MED ORDER — DOCUSATE SODIUM 50 MG/5ML PO LIQD
100.0000 mg | Freq: Two times a day (BID) | ORAL | Status: DC
  Administered 2023-11-14 – 2023-11-17 (×5): 100 mg
  Filled 2023-11-14 (×5): qty 10

## 2023-11-14 MED ORDER — FENTANYL CITRATE PF 50 MCG/ML IJ SOSY
25.0000 ug | PREFILLED_SYRINGE | Freq: Once | INTRAMUSCULAR | Status: AC
  Administered 2023-11-14: 50 ug via INTRAVENOUS

## 2023-11-14 MED ORDER — MIDAZOLAM BOLUS VIA INFUSION: 0.0000 mg | INTRAVENOUS | Status: DC | PRN

## 2023-11-14 MED ORDER — MIDAZOLAM-SODIUM CHLORIDE 100-0.9 MG/100ML-% IV SOLN: 0.0000 mg/h | INTRAVENOUS | Status: DC

## 2023-11-14 MED ORDER — FUROSEMIDE 10 MG/ML IJ SOLN
120.0000 mg | Freq: Once | INTRAVENOUS | Status: AC
  Administered 2023-11-14: 120 mg via INTRAVENOUS
  Filled 2023-11-14: qty 120

## 2023-11-14 MED ORDER — FENTANYL BOLUS VIA INFUSION
25.0000 ug | INTRAVENOUS | Status: DC | PRN
  Administered 2023-11-14: 25 ug via INTRAVENOUS
  Administered 2023-11-14 – 2023-11-15 (×7): 100 ug via INTRAVENOUS

## 2023-11-14 MED ORDER — MIDAZOLAM HCL 2 MG/2ML IJ SOLN
INTRAMUSCULAR | Status: AC
  Administered 2023-11-14: 2 mg
  Filled 2023-11-14: qty 2

## 2023-11-14 MED ORDER — FENTANYL CITRATE PF 50 MCG/ML IJ SOSY
PREFILLED_SYRINGE | INTRAMUSCULAR | Status: AC
Start: 2023-11-14 — End: 2023-11-14
  Administered 2023-11-14: 50 ug
  Filled 2023-11-14: qty 2

## 2023-11-14 MED ORDER — ALBUMIN HUMAN 25 % IV SOLN
25.0000 g | Freq: Once | INTRAVENOUS | Status: AC
Start: 2023-11-14 — End: 2023-11-14
  Administered 2023-11-14: 25 g via INTRAVENOUS
  Filled 2023-11-14: qty 100

## 2023-11-14 MED ORDER — PANTOPRAZOLE SODIUM 40 MG IV SOLR
40.0000 mg | Freq: Every day | INTRAVENOUS | Status: DC
  Administered 2023-11-14 – 2023-11-18 (×5): 40 mg via INTRAVENOUS
  Filled 2023-11-14 (×5): qty 10

## 2023-11-14 SURGICAL SUPPLY — 1 items: PACK CARDIAC CATHETERIZATION (CUSTOM PROCEDURE TRAY) ×2 IMPLANT

## 2023-11-14 NOTE — Progress Notes (Signed)
 PROGRESS NOTE    Kayla Schmitt  FMW:[id number] DOB: October 23, 1965 DOA: 11/12/2023 PCP: [name], NP  57/F with multivessel CAD, severe aortic stenosis, mitral regurgitation, chronic combined CHF, PAD, hypertension, GI bleed, recent hospitalization for CHF as well as necrotizing fasciitis of left leg, underwent BKA, discharged to CIR on 7/1, over the past week has had progressive dyspnea on exertion, increased swelling, briefly diuretics held for worsening renal function and received PRBC transfusion as well on 7/15 had worsening dyspnea with hypoxia, troponin went from 489 to 671 and admitted to ED [facility] from CIR, she is also on IV daptomycin  for MRSA bacteremia. - 7/15, transferred back to [facility] from CIR, restarted on IV Lasix    Subjective: - Feels a little better this morning, earlier had worsening dyspnea  Assessment and Plan:  Acute on chronic diastolic (congestive) heart failure (HCC) Moderate mitral regurgitation Moderate to severe aortic stenosis -CHF complicated by hypoalbuminemia and third spacing as well - Given IV Lasix  with albumin  yesterday, urine output inaccurate, discussed with patient -Last echo 6/26 with preserved EF, normal RV, moderate to severe aortic stenosis -Add Aldactone if creatinine remains improved, poor candidate for SGLT2i - Cards following, plan for right heart cath today  Severe multivessel CAD -Had original plans for CABG/MVR however this has been postponed on account of multiple ongoing active issues -Continue aspirin , Plavix  on hold at this time with worsening anemia -Suspect demand ischemia in the setting of CHF, anemia   MRSA bacteremia Necrotizing fasciitis (HCC) S/P BKA (below knee amputation) unilateral, left (HCC) Seen by ID during recent hospitalization, plan to continue daptomycin  till 7/20 and follow-up with Dr. Dea, echo was negative for endocarditis -Started on IV Unasyn  as well 7/15 by Dr. Tobie for worsening  leukocytosis -Follow-up blood cultures pending -, De-escalate antibiotics soon, hopefully stop Unasyn  tomorrow   Ulcerated, foot, right, limited to breakdown of skin (HCC) No evidence of active infection.  Appears to be chronic.   PAD Seen by vascular surgery during recent admission. On Plavix .  Now switched to aspirin  given risk for bleeding.   Insulin  dependent type 2 diabetes mellitus (HCC) On Semglee  10 units for now. Continue sliding scale insulin .   GERD (gastroesophageal reflux disease) Continue PPI.   Hyperlipidemia Continue statin.   Hypertension Stable   Hypothyroidism Continue Synthroid .   Anemia Iron  deficiency anemia -Prior history of small bowel AVMs Transfused 1 unit of PRBC.  Earlier this admit, anemia panel with iron  deficiency, add IV iron  today     DVT prophylaxis: Heparin  subcutaneous Code Status: Full code Family Communication: Mother at bedside Disposition Plan: TBD  Consultants: Cards   Procedures:   Antimicrobials:    Objective: Vitals:   11/13/23 2028 11/14/23 0024 11/14/23 0438 11/14/23 0809  BP: (!) 96/52 111/68 126/78 120/69  Pulse: 90 96 99   Resp: 20 19 20  (!) 23  Temp: 98.3 F (36.8 C) 97.6 F (36.4 C) 97.7 F (36.5 C) 98.4 F (36.9 C)  TempSrc: Oral Oral Oral Oral  SpO2: 99% 95% 92% 94%  Weight:   91.7 kg   Height:        Intake/Output Summary (Last 24 hours) at 11/14/2023 1025 Last data filed at 11/14/2023 0024 Gross per 24 hour  Intake 860 ml  Output --  Net 860 ml   Filed Weights   11/12/23 1800 11/13/23 0448 11/14/23 0438  Weight: 92.5 kg 91.7 kg 91.7 kg    Examination:  General exam: Chronically ill female sitting up at the  edge of the bed, AAO x 3 HEENT: Positive JVD Lungs: Decreased breath sounds at the bases CVS: S1-S2, regular rhythm, systolic murmur  Abd: nondistended, soft and nontender.Normal bowel sounds heard. Central nervous system: Alert and oriented. No focal neurological  deficits. Extremities: Left BKA, 2+ edema in right lower extremity, right foot transmetatarsal amputation Skin: No rashes Psychiatry:  Mood & affect appropriate.     Data Reviewed:   CBC: Recent Labs  Lab 11/08/23 0410 11/09/23 0453 11/10/23 0305 11/11/23 0601 11/12/23 0405 11/13/23 0520 11/14/23 0502  WBC 7.2   < > 10.3 9.7 11.4* 14.0* 13.2*  NEUTROABS 4.0  --  6.8  --  7.7 10.2*  --   HGB 7.3*   < > 8.2* 7.7* 9.1* 9.2* 9.0*  HCT 23.4*   < > 26.4* 24.7* 29.5* 28.8* 28.9*  MCV 92.9   < > 90.4 91.5 91.9 89.7 90.3  PLT 301   < > 386 405* 496* 571* 510*   < > = values in this interval not displayed.   Basic Metabolic Panel: Recent Labs  Lab 11/10/23 0305 11/11/23 0601 11/12/23 0405 11/13/23 0520 11/14/23 0502  NA 136 135 135 135 136  K 4.0 4.0 4.4 4.3 3.5  CL 100 100 100 98 98  CO2 26 26 23 24 23   GLUCOSE 149* 172* 181* 176* 178*  BUN 32* 32* 34* 37* 40*  CREATININE 1.33* 1.28* 1.28* 1.37* 1.56*  CALCIUM  8.6* 8.5* 8.7* 9.0 8.8*  MG  --   --   --   --  1.8   GFR: Estimated Creatinine Clearance: 44.2 mL/min (A) (by C-G formula based on SCr of 1.56 mg/dL (H)). Liver Function Tests: Recent Labs  Lab 11/12/23 1921  AST 24  ALT 19  ALKPHOS 115  BILITOT 0.9  PROT 8.0  ALBUMIN  2.2*   No results for input(s): LIPASE, AMYLASE in the last 168 hours. No results for input(s): AMMONIA in the last 168 hours. Coagulation Profile: Recent Labs  Lab 11/12/23 1921  INR 1.3*   Cardiac Enzymes: Recent Labs  Lab 11/11/23 0601  CKTOTAL 106   BNP (last 3 results) No results for input(s): PROBNP in the last 8760 hours. HbA1C: No results for input(s): HGBA1C in the last 72 hours. CBG: Recent Labs  Lab 11/13/23 0600 11/13/23 1124 11/13/23 1724 11/13/23 2110 11/14/23 0619  GLUCAP 167* 200* 150* 170* 182*   Lipid Profile: No results for input(s): CHOL, HDL, LDLCALC, TRIG, CHOLHDL, LDLDIRECT in the last 72 hours. Thyroid Function Tests: No  results for input(s): TSH, T4TOTAL, FREET4, T3FREE, THYROIDAB in the last 72 hours. Anemia Panel: Recent Labs    11/12/23 1921  VITAMINB12 658  FOLATE 9.6  FERRITIN 989*  TIBC 162*  IRON  18*  RETICCTPCT 2.6   Urine analysis:    Component Value Date/Time   COLORURINE YELLOW 10/22/2023 0609   APPEARANCEUR CLOUDY (A) 10/22/2023 0609   LABSPEC 1.016 10/22/2023 0609   PHURINE 5.0 10/22/2023 0609   GLUCOSEU NEGATIVE 10/22/2023 0609   HGBUR SMALL (A) 10/22/2023 0609   BILIRUBINUR NEGATIVE 10/22/2023 0609   KETONESUR NEGATIVE 10/22/2023 0609   PROTEINUR NEGATIVE 10/22/2023 0609   NITRITE NEGATIVE 10/22/2023 0609   LEUKOCYTESUR NEGATIVE 10/22/2023 0609   Sepsis Labs: @LABRCNTIP (procalcitonin:4,lacticidven:4)  ) Recent Results (from the past 240 hours)  Culture, blood (Routine X 2) w Reflex to ID Panel     Status: None (Preliminary result)   Collection Time: 11/12/23  7:21 PM   Specimen: BLOOD RIGHT ARM  Result Value Ref Range Status   Specimen Description BLOOD RIGHT ARM  Final   Special Requests   Final    BOTTLES DRAWN AEROBIC AND ANAEROBIC Blood Culture results may not be optimal due to an inadequate volume of blood received in culture bottles   Culture   Final    NO GROWTH 2 DAYS Performed at [facility], [address] [place], [place] [address]    Report Status PENDING  Incomplete  Culture, blood (Routine X 2) w Reflex to ID Panel     Status: None (Preliminary result)   Collection Time: 11/12/23  7:21 PM   Specimen: BLOOD RIGHT HAND  Result Value Ref Range Status   Specimen Description BLOOD RIGHT HAND  Final   Special Requests   Final    BOTTLES DRAWN AEROBIC AND ANAEROBIC Blood Culture results may not be optimal due to an inadequate volume of blood received in culture bottles   Culture   Final    NO GROWTH 2 DAYS Performed at [facility], [address], [place], [place] [address]    Report Status PENDING  Incomplete     Radiology  Studies: No results found.    Scheduled Meds:  aspirin  EC  81 mg Oral Daily   atorvastatin   40 mg Oral Daily   Chlorhexidine  Gluconate Cloth  6 each Topical Daily   ferrous sulfate   325 mg Oral Q breakfast   furosemide   80 mg Intravenous BID   gabapentin   300 mg Oral QHS   heparin  injection (subcutaneous)  5,000 Units Subcutaneous Q8H   insulin  aspart  0-6 Units Subcutaneous TID WC   levothyroxine   150 mcg Oral QAC breakfast   pantoprazole   40 mg Oral Daily   polyethylene glycol  17 g Oral BID   senna-docusate  1 tablet Oral BID   sodium chloride  flush  10-40 mL Intracatheter Q12H   Continuous Infusions:  albumin  human     ampicillin -sulbactam (UNASYN ) IV 3 g (11/14/23 0810)   DAPTOmycin  Stopped (11/13/23 1720)     LOS: 2 days    Time spent:    Sigurd Pac, MD [facility]   11/14/2023, 10:25 AM

## 2023-11-14 NOTE — Progress Notes (Addendum)
 Progress Note Patient Name: Kayla Schmitt Date of Encounter: 11/14/2023 [facility] Cardiologist: Vishnu P Mallipeddi, MD   Interval Summary   BP 120/69.  I/os not recorded.  Creatinine trending up (1.28 > 1.37 > 1.56).  On 7L HFNC.  She reports feeling shor tof breath  Vital Signs Vitals:   11/13/23 2028 11/14/23 0024 11/14/23 0438 11/14/23 0809  BP: (!) 96/52 111/68 126/78 120/69  Pulse: 90 96 99   Resp: 20 19 20  (!) 23  Temp: 98.3 F (36.8 C) 97.6 F (36.4 C) 97.7 F (36.5 C) 98.4 F (36.9 C)  TempSrc: Oral Oral Oral Oral  SpO2: 99% 95% 92% 94%  Weight:   91.7 kg   Height:        Intake/Output Summary (Last 24 hours) at 11/14/2023 0845 Last data filed at 11/14/2023 0024 Gross per 24 hour  Intake 860 ml  Output --  Net 860 ml       11/14/2023    4:38 AM 11/13/2023    4:48 AM 11/12/2023    6:00 PM  Last 3 Weights  Weight (lbs) 202 lb 2.6 oz 202 lb 2.6 oz 203 lb 14.8 oz  Weight (kg) 91.7 kg 91.7 kg 92.5 kg      Telemetry/ECG  No telemetry available- Personally Reviewed EKG sinus tachycardia, rate 105, LVH, poor R wave progression, inferior Q waves  Physical Exam  GEN: No acute distress.   Neck: No JVD Cardiac: RRR, 3/6 systolic murmur Respiratory: Diminished breath sounds GI: Soft, nontender, non-distended  MS: 1+ edema right lower extremity, left BKA, right transmetatarsal amputation  Assessment & Plan  87-year female with history of PAD, CAD, MR, T2DM, hypertension we are consulted for evaluation of heart failure  Acute on chronic diastolic heart failure: Presented with worsening lower extremity edema and dyspnea.  Has been diuresed with IV Lasix .   - Recommend strict I/O's and daily weights  - Worsening renal function with diuresis.  She has significant edema, but suspect hypoalbuminemia (2.2) contributing to this.  Does have new oxygen  requirement . Recommend RHC for further evaluation, discussed with Dr Bensimhon, will plan for RHC  today  ADDENDUM: called by Dr Cherrie, patient developed worsening hypoxia in cath lab, unable to lie on table for RHC.  Procedure aborted.  CXR shows significant worsening diffuse airspace opacities.  Suspect likely flash pulmonary edema.  Given dose of IV lasix  80mg  but only 100cc UOP.  D/w Dr Bensimhon, plan to move to [facility], will need at least Bipap, possible intubation.    CAD: Admitted with NSTEMI 07/2022.  Cath showed severe multivessel disease.  Severe MR on echo.  Had plan for CABG/MVR but this was postponed given multiple ongoing medical issues. - Continue statin - Recommended switching from Plavix  to aspirin  81 mg daily for bleeding risk as has required multiple transfusions but her preference is to stay on plavix .  Suggest continuing aspirin  temporarily and if stable hemoglobin can transition back to plavix  - Mild troponin elevation, not currently a candidate for anticoagulation or heart catheterization in setting of her anemia requiring transfusion.  Could represent demand ischemia in setting of her anemia and decompensated heart failure  Acute hypoxic respiratory failure: On 7 L HFNC.  Suspect acute on chronic diastolic heart failure as above.  Planning RHC as above  Aortic stenosis: Moderate on TTE 09/2023, moderate to severe by TEE 09/2023  Mild regurgitation: Severe by echo in 2024.  TEE 09/2023 showed mild to moderate MR  MRSA bacteremia:  Hospitalized 09/2023 with MRSA bacteremia.  TEE 10/24/2023 showed moderate MR, moderate to severe aortic stenosis, no endocarditis.  She had necrotizing fasciitis, underwent left below the knee amputation.  Anemia: Hemoglobin down to 7.7 on 7/14. Recommend transfuse for goal Hgb>8 given cardiac issues.  Suggested switching from plavix  to ASA 81 mg daily to decrease bleeding risk but she wishes to remain on Plavix , though agreeable to switching to aspirin  until ensure no further bleeding.  Received 1 unit PRBCs on 7/14, Hgb 9.0 today  For questions or  updates, please contact [facility] Please consult [email or web address] for contact info under       Signed, Lonni LITTIE Nanas, MD

## 2023-11-14 NOTE — Consult Note (Addendum)
 Advanced Heart Failure Team Consult Note   Primary Physician: [name], NP Cardiologist:  Vishnu P Mallipeddi, MD  Reason for Consultation: Acute on chronic HFpEF  HPI:    Kayla Schmitt is seen today for evaluation of acute on chronic HFpEF at the request of Dr. Kate with [facility]. 58 y.o. 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   She was admitted with NSTEMI in 04/24. Cath with 3V CAD and significant MR. CABG and MVR recommended but has not undergone surgery d/t multiple medical issues.  She was hospitalized in 06/25 for MRSA bacteremia and necrotizing fasciitis. She ended up requiring L BKA followed by revision. TEE 06/25 with EF 60-65%, mild to moderate MR and moderate to severe AS but no evidence of endocarditis. Four week course of IV abx with daptomycin  recommended. At discharge 07/01, she was transferred to [facility] for inpatient rehab.  Cardiology consulted 07/10 d/t worsening HF symptoms including dyspnea and leg edema. She was started on IV diuretics. Diuretics later stopped d/t rising Scr and it was felt hypoalbuminemia may also be contributing to edema. On 07/15 she was transferred back to [facility] d/t worsening HF symptoms. She was set up for RHC today to better assess volume status. Case was aborted d/t significant dyspnea w/ hypoxia and inability to lie flat. She was given 80 mg lasix  IV with minimal response. CXR with diffuse airspace opacities, likely pulmonary edema.  Advanced Heart Failure asked to see d/t concern for low-output HF. On our arrival she was sitting up on side of bed leaning on bedside table to catch her breath. Extremities are cool and she is diaphoretic.   Home Medications Prior to Admission medications   Medication Sig Start Date End Date Taking? Authorizing Provider  acetaminophen  (TYLENOL ) 500 MG tablet Take 1,000 mg by mouth 2 (two) times daily as  needed for moderate pain (pain score 4-6), headache or fever.   Yes [provider]  Alcohol Swabs  (PHARMACIST CHOICE ALCOHOL) PADS Apply topically. 04/09/23  Yes [provider]  aspirin  81 MG EC tablet Take 81 mg by mouth daily.   Yes [provider]  atorvastatin  (LIPITOR) 40 MG tablet Take 40 mg by mouth daily. 12/14/19  Yes [provider]  bisacodyl  (DULCOLAX) 10 MG suppository Place 1 suppository (10 mg total) rectally daily as needed for moderate constipation or mild constipation. 10/29/23  Yes Hongalgi, Anand D, MD  clopidogrel  (PLAVIX ) 75 MG tablet TAKE 1 TABLET BY MOUTH EVERY DAY WITH BREAKFAST 01/28/23  Yes Sheree Penne Bruckner, MD  daptomycin  (CUBICIN ) IVPB Inject 700 mg into the vein daily for 6 days. Indication:  MRSA bacteremia First Dose: Yes Last Day of Therapy:  11/17/23 Labs - Once weekly:  CBC/D, BMP, and CPK Labs - Once weekly: ESR and CRP Method of administration: IV Push Method of administration may be changed at the discretion of home infusion pharmacist based upon assessment of the patient and/or caregiver's ability to self-administer the medication ordered. Followed by ID physician: Dr. Annalee Orem 11/11/23 11/17/23 Yes Love, Sharlet RAMAN, PA-C  diphenhydrAMINE  HCl, Sleep, (ZZZQUIL) 25 MG CAPS Take 25 mg by mouth at bedtime as needed (Sleep).   Yes [provider]  ferrous sulfate  325 (65 FE) MG EC tablet Take 1 tablet (325 mg total) by mouth daily with breakfast. 04/07/23 04/06/24 Yes Fairy Frames, MD  furosemide  (LASIX ) 40 MG tablet Take  1 tablet (40 mg total) by mouth 2 (two) times daily. 08/27/22  Yes Leotis Bogus, MD  gabapentin  (NEURONTIN ) 300 MG capsule Take 300 mg by mouth at bedtime.   Yes [provider]  insulin  degludec (TRESIBA  FLEXTOUCH) 100 UNIT/ML FlexTouch Pen Inject 10 Units into the skin at bedtime. Patient taking differently: Inject 16 Units into the skin at bedtime. 01/15/20  Yes Danford,  Lonni SQUIBB, MD  levothyroxine  (SYNTHROID ) 150 MCG tablet Take 150 mcg by mouth daily before breakfast. 12/02/19  Yes [provider]  metoprolol  succinate (TOPROL -XL) 25 MG 24 hr tablet Take 0.5 tablets (12.5 mg total) by mouth daily. 08/28/22  Yes Leotis Bogus, MD  oxyCODONE  (ROXICODONE ) 5 MG immediate release tablet Take 1 tablet (5 mg total) by mouth every 6 (six) hours as needed for severe pain (pain score 7-10). 10/15/23  Yes Francesca Elsie CROME, MD  pantoprazole  (PROTONIX ) 40 MG tablet Take 40 mg by mouth daily. 12/21/19  Yes [provider]  polyethylene glycol (MIRALAX  / GLYCOLAX ) 17 g packet Take 17 g by mouth 2 (two) times daily. 10/29/23  Yes Hongalgi, Anand D, MD  promethazine  (PHENERGAN ) 25 MG tablet Take 1 tablet (25 mg total) by mouth every 6 (six) hours as needed for nausea or vomiting. 10/15/23  Yes Francesca Elsie CROME, MD  senna-docusate (SENOKOT-S) 8.6-50 MG tablet Take 1 tablet by mouth 2 (two) times daily. 10/29/23  Yes Hongalgi, Anand D, MD  nitroGLYCERIN  (NITROSTAT ) 0.4 MG SL tablet Place 1 tablet (0.4 mg total) under the tongue every 5 (five) minutes x 3 doses as needed for chest pain (if no relief after 3rd dose proceed to ED or call 911). 11/06/2022-New 11/06/22   Mallipeddi, Diannah SQUIBB, MD    Past Medical History: Past Medical History:  Diagnosis Date   Anemia    Aortic stenosis    Arthritis    CHF (congestive heart failure) (HCC)    Coronary artery disease    Diabetes mellitus without complication (HCC)    type 2   GERD (gastroesophageal reflux disease)    Heart murmur    History of blood transfusion    Hypercholesteremia    Hypertension    Hypothyroidism    Mitral regurgitation    Myocardial infarction (HCC)    Peripheral vascular disease (HCC)    PONV (postoperative nausea and vomiting)    Thyroid disease     Past Surgical History: Past Surgical History:  Procedure Laterality Date   ABDOMINAL AORTOGRAM W/LOWER EXTREMITY Bilateral 01/14/2020    Procedure: ABDOMINAL AORTOGRAM W/LOWER EXTREMITY;  Surgeon: Gretta Lonni PARAS, MD;  [facility]: [facility];  Service: Cardiovascular;  Laterality: Bilateral;   ABDOMINAL AORTOGRAM W/LOWER EXTREMITY N/A 07/05/2022   Procedure: ABDOMINAL AORTOGRAM W/LOWER EXTREMITY;  Surgeon: Gretta Lonni PARAS, MD;  Location: [facility];  Service: Cardiovascular;  Laterality: N/A;   ABDOMINAL AORTOGRAM W/LOWER EXTREMITY N/A 08/02/2022   Procedure: ABDOMINAL AORTOGRAM W/LOWER EXTREMITY;  Surgeon: Gretta Lonni PARAS, MD;  Location: [facility];  Service: Cardiovascular;  Laterality: N/A;   ABDOMINAL AORTOGRAM W/LOWER EXTREMITY N/A 08/06/2022   Procedure: ABDOMINAL AORTOGRAM W/LOWER EXTREMITY;  Surgeon: Gretta Lonni PARAS, MD;  Location: [facility];  Service: Cardiovascular;  Laterality: N/A;   AMPUTATION Bilateral 07/20/2022   Procedure: AMPUTATION RIGHT GREAT TOE AND SECOND TOE, AMPUTATION LEFT GREAT TOE;  Surgeon: Harden Jerona GAILS, MD;  Location: [facility] OR;  Service: Orthopedics;  Laterality: Bilateral;   AMPUTATION Bilateral 08/08/2022   Procedure: BILATERAL TRANSMETATARSAL AMPUTATION;  Surgeon: Harden Jerona GAILS, MD;  Location: [facility] OR;  Service: Orthopedics;  Laterality: Bilateral;   AMPUTATION Left 10/22/2023   Procedure: AMPUTATION BELOW KNEE;  Surgeon: Lanis Fonda BRAVO, MD;  Location: [facility] OR;  Service: Vascular;  Laterality: Left;   AMPUTATION TOE     BIOPSY  04/06/2023   Procedure: BIOPSY;  Surgeon: Cayci Elspeth SQUIBB, MD;  Location: [facility] ENDOSCOPY;  Service: Gastroenterology;;   COLONOSCOPY WITH PROPOFOL  N/A 04/06/2023   Procedure: COLONOSCOPY WITH PROPOFOL ;  Surgeon: Zamiya Elspeth SQUIBB, MD;  Location: [facility] ENDOSCOPY;  Service: Gastroenterology;  Laterality: N/A;   ESOPHAGOGASTRODUODENOSCOPY (EGD) WITH PROPOFOL  N/A 04/06/2023   Procedure: ESOPHAGOGASTRODUODENOSCOPY (EGD) WITH PROPOFOL ;  Surgeon: Janeah Elspeth SQUIBB, MD;  Location: [facility] ENDOSCOPY;  Service: Gastroenterology;  Laterality: N/A;    GIVENS CAPSULE STUDY N/A 04/06/2023   Procedure: GIVENS CAPSULE STUDY;  Surgeon: Nolene Elspeth SQUIBB, MD;  Location: [facility] ENDOSCOPY;  Service: Gastroenterology;  Laterality: N/A;   PERIPHERAL VASCULAR ATHERECTOMY Right 01/14/2020   Procedure: PERIPHERAL VASCULAR ATHERECTOMY;  Surgeon: Gretta Lonni PARAS, MD;  Location: [facility] INVASIVE CV LAB;  Service: Cardiovascular;  Laterality: Right;  sfa   PERIPHERAL VASCULAR INTERVENTION Right 01/14/2020   Procedure: PERIPHERAL VASCULAR INTERVENTION;  Surgeon: Gretta Lonni PARAS, MD;  [facility];  Service: Cardiovascular;  Laterality: Right;  sfa stent    PERIPHERAL VASCULAR INTERVENTION  07/05/2022   Procedure: PERIPHERAL VASCULAR INTERVENTION;  Surgeon: Gretta Lonni PARAS, MD;  [facility];  Service: Cardiovascular;;   PERIPHERAL VASCULAR INTERVENTION  08/02/2022   Procedure: PERIPHERAL VASCULAR INTERVENTION;  Surgeon: Gretta Lonni PARAS, MD;  [facility];  Service: Cardiovascular;;   PERIPHERAL VASCULAR INTERVENTION  08/06/2022   Procedure: PERIPHERAL VASCULAR INTERVENTION;  Surgeon: Gretta Lonni PARAS, MD;  Location: [facility]VASIVE CV LAB;  Service: Cardiovascular;;   PERIPHERAL VASCULAR THROMBECTOMY  08/02/2022   Procedure: PERIPHERAL VASCULAR THROMBECTOMY;  Surgeon: Gretta Lonni PARAS, MD;  [facility];  Service: Cardiovascular;;   REVISION AMPUTATION, BELOW THE KNEE Left 10/23/2023   Procedure: REVISION AMPUTATION, BELOW THE KNEE;  Surgeon: Harden Jerona GAILS, MD;  Location: [facility] OR;  Service: Orthopedics;  Laterality: Left;  REVISION LEFT BELOW KNEE AMPUTATION   RIGHT/LEFT HEART CATH AND CORONARY ANGIOGRAPHY N/A 08/23/2022   Procedure: RIGHT/LEFT HEART CATH AND CORONARY ANGIOGRAPHY;  Surgeon: Dann Candyce RAMAN, MD;  Location: [facility] INVASIVE CV LAB;  Service: Cardiovascular;  Laterality: N/A;   SKIN SPLIT GRAFT Right 03/18/2020   Procedure: SKIN GRAFTING RIGHT FOOT ULCER;  Surgeon: Harden Jerona GAILS, MD;  Location: [facility] OR;  Service: Orthopedics;  Laterality: Right;   STUMP REVISION Right 11/30/2022   Procedure: REVISION RIGHT TRANSMETATARSAL AMPUTATION;  Surgeon: Harden Jerona GAILS, MD;  Location: [facility] OR;  Service: Orthopedics;  Laterality: Right;   TEE WITHOUT CARDIOVERSION N/A 08/27/2022   Procedure: TRANSESOPHAGEAL ECHOCARDIOGRAM;  Surgeon: Hobart Powell BRAVO, MD;  Location: [facility] INVASIVE CV LAB;  Service: Cardiovascular;  Laterality: N/A;   TRANSESOPHAGEAL ECHOCARDIOGRAM (CATH LAB) N/A 10/24/2023   Procedure: TRANSESOPHAGEAL ECHOCARDIOGRAM;  Surgeon: Michele Richardson, DO;  [facility]: [facility];  Service: Cardiovascular;  Laterality: N/A;   WISDOM TOOTH EXTRACTION      Family History: Family History  Problem Relation Age of Onset   Diabetes Other    CAD Other     Social History: Social History   Socioeconomic History   Marital status: Divorced    Spouse name: Not on file   Number of children: 0   Years  of education: Not on file   Highest education level: Not on file  Occupational History   Not on file  Tobacco Use   Smoking status: Former    Current packs/day: 0.00    Types: Cigarettes    Quit date: 01/2017    Years since quitting: 6.7   Smokeless tobacco: Never  Vaping Use   Vaping status: Never Used  Substance and Sexual Activity   Alcohol use: Not Currently   Drug use: Yes    Types: Marijuana    Comment: on ocassion- Last time - 11/13/22   Sexual activity: Yes    Birth control/protection: Post-menopausal  Other Topics Concern   Not on file  Social History Narrative   Not on file   Social Drivers of Health   Financial Resource Strain: Low Risk  (10/19/2023)   Received from [facility]   Overall Financial Resource Strain (CARDIA)    How hard is it for you to pay for the very basics like food, housing, medical care, and heating?: Not hard at all  Food Insecurity: No Food Insecurity (11/14/2023)   Hunger Vital Sign    Worried About Running Out of Food in  the Last Year: Never true    Ran Out of Food in the Last Year: Never true  Transportation Needs: No Transportation Needs (11/14/2023)   PRAPARE - Administrator, Civil Service (Medical): No    Lack of Transportation (Non-Medical): No  Physical Activity: Not on file  Stress: Not on file  Social Connections: Not on file    Allergies:  Allergies  Allergen Reactions   Trental  [Pentoxifylline ] Nausea And Vomiting   Vibramycin [Doxycycline] Nausea And Vomiting    Objective:    Vital Signs:   Temp:  [97.6 F (36.4 C)-98.4 F (36.9 C)] 98.2 F (36.8 C) (07/17 1200) Pulse Rate:  [90-118] 118 (07/17 1631) Resp:  [19-35] 35 (07/17 1631) BP: (96-126)/(52-79) 123/79 (07/17 1629) SpO2:  [90 %-99 %] 97 % (07/17 1631) Weight:  [91.7 kg] 91.7 kg (07/17 0438) Last BM Date : 11/13/23  Weight change: Filed Weights   11/12/23 1800 11/13/23 0448 11/14/23 0438  Weight: 92.5 kg 91.7 kg 91.7 kg    Intake/Output:   Intake/Output Summary (Last 24 hours) at 11/14/2023 1648 Last data filed at 11/14/2023 1428 Gross per 24 hour  Intake 940 ml  Output 100 ml  Net 840 ml      Physical Exam    General:  Acute on chronically ill appearing Neck: JVP to jaw Cor: Regular rate & rhythm, tachy. 2/6 AS murmur Lungs: Diminished Abdomen: obese, distended Extremities: 2-3+ edema, dressing over R TMA stump and L BKA stump, extremities are cool Neuro: alert & orientedx3. Affect anxious   Telemetry   ST 100s  Labs   Basic Metabolic Panel: Recent Labs  Lab 11/10/23 0305 11/11/23 0601 11/12/23 0405 11/13/23 0520 11/14/23 0502  NA 136 135 135 135 136  K 4.0 4.0 4.4 4.3 3.5  CL 100 100 100 98 98  CO2 26 26 23 24 23   GLUCOSE 149* 172* 181* 176* 178*  BUN 32* 32* 34* 37* 40*  CREATININE 1.33* 1.28* 1.28* 1.37* 1.56*  CALCIUM  8.6* 8.5* 8.7* 9.0 8.8*  MG  --   --   --   --  1.8    Liver Function Tests: Recent Labs  Lab 11/12/23 1921  AST 24  ALT 19  ALKPHOS 115  BILITOT  0.9  PROT 8.0  ALBUMIN  2.2*  No results for input(s): LIPASE, AMYLASE in the last 168 hours. No results for input(s): AMMONIA in the last 168 hours.  CBC: Recent Labs  Lab 11/08/23 0410 11/09/23 0453 11/10/23 0305 11/11/23 0601 11/12/23 0405 11/13/23 0520 11/14/23 0502  WBC 7.2   < > 10.3 9.7 11.4* 14.0* 13.2*  NEUTROABS 4.0  --  6.8  --  7.7 10.2*  --   HGB 7.3*   < > 8.2* 7.7* 9.1* 9.2* 9.0*  HCT 23.4*   < > 26.4* 24.7* 29.5* 28.8* 28.9*  MCV 92.9   < > 90.4 91.5 91.9 89.7 90.3  PLT 301   < > 386 405* 496* 571* 510*   < > = values in this interval not displayed.    Cardiac Enzymes: Recent Labs  Lab 11/11/23 0601  CKTOTAL 106    BNP: BNP (last 3 results) Recent Labs    11/03/23 0112 11/07/23 1457 11/13/23 0521  BNP 793.8* 336.4* 1,626.5*    ProBNP (last 3 results) No results for input(s): PROBNP in the last 8760 hours.   CBG: Recent Labs  Lab 11/13/23 1124 11/13/23 1724 11/13/23 2110 11/14/23 0619 11/14/23 1157  GLUCAP 200* 150* 170* 182* 171*    Coagulation Studies: Recent Labs    11/12/23 1921  LABPROT 16.6*  INR 1.3*     Imaging   ABORTED INVASIVE LAB PROCEDURE Result Date: 11/14/2023 Patient unable to get on the cath table due to severe dyspnea/orthopnea. Returned to unit for further evaluation and treatment.   DG CHEST PORT 1 VIEW Result Date: 11/14/2023 CLINICAL DATA:  Hypoxia EXAM: PORTABLE CHEST 1 VIEW COMPARISON:  11/11/2023 FINDINGS: Prominent and worsening bilateral diffuse airspace opacities in the lungs with obscuration of the cardiac margins and diaphragms. Atherosclerotic calcification of the aortic arch. Lower thoracic spondylosis. Degenerative AC joint arthropathy on the left. IMPRESSION: 1. Prominent and worsening bilateral diffuse airspace opacities in the lungs, with obscuration of the cardiac margins and diaphragms. This could reflect diffuse pulmonary edema or diffuse pneumonia. 2. Aortic Atherosclerosis  (ICD10-I70.0). Electronically Signed   By: Ryan Salvage M.D.   On: 11/14/2023 13:07     Medications:     Current Medications:  aspirin  EC  81 mg Oral Daily   atorvastatin   40 mg Oral Daily   Chlorhexidine  Gluconate Cloth  6 each Topical Daily   docusate  100 mg Per Tube BID   etomidate        fentaNYL        fentaNYL  (SUBLIMAZE ) injection  25-50 mcg Intravenous Once   ferrous sulfate   325 mg Oral Q breakfast   gabapentin   300 mg Oral QHS   heparin  injection (subcutaneous)  5,000 Units Subcutaneous Q8H   insulin  aspart  0-6 Units Subcutaneous TID WC   ketamine  HCl       levothyroxine   150 mcg Oral QAC breakfast   midazolam        pantoprazole   40 mg Oral Daily   polyethylene glycol  17 g Oral BID   potassium chloride   40 mEq Oral Once   rocuronium        senna-docusate  1 tablet Oral BID   sodium chloride  flush  10-40 mL Intracatheter Q12H   succinylcholine         Infusions:  ampicillin -sulbactam (UNASYN ) IV 3 g (11/14/23 0810)   DAPTOmycin  700 mg (11/14/23 1459)   DOBUTamine      fentaNYL  infusion INTRAVENOUS     furosemide      furosemide  (LASIX ) 200 mg in dextrose  5 % 100  mL (2 mg/mL) infusion     midazolam         Patient Profile   58 y.o. female with history of PAD with prior SFA stents and bilateral TMA, severe 3v CAD, MR/AS, DM II, anemia, HTN, HLD, hypothyroidism.   Admitted in 06/25 with MRSA bactermia and necrotizing fasciitis s/p left BKA. Transferred back to [facility] from rehab d/t acute on chronic CHF with concern for low-output and acute respiratory failure.  Assessment/Plan   Acute on chronic HFpEF w/ concern for low-output/shock -TTE 12/24: EF 40-45% -TTE 06/25: EF 50-55% -TEE 06/25: EF 60-65%, RV okay, mild to moderate MR, moderate to severe AS with mean gradient 27 mmHg and AVA 1.07 cm2 -Significantly volume overloaded on exam. NYHA IV. Has not responded to attempts to diurese. Has albumin  of 2.2 which is likely contributing to some of her  edema. -Start DBA at 2.5 mcg/kg/min. Has single lumen PICC for abx, will need central line. -Check lactic acid -Give 120 mg lasix  IV then start lasix  gtt at 20/hr -Repeat echo to assess EF and valvular disease  2. Acute respiratory failure with hypoxia -CXR with b/l diffuse airspace opacities, likely pulmonary edema -Trial BiPAP>>may need intubation -Aggressive diuresis  3. CAD -NSTEMI in 04/24. Multivessel CAD. CABG had been recommended but managed medically d/t medical issues -On aspirin  + statin  4. Valvular heart disease - Mild to moderate MR and moderate to severe AS on echo 06/25 - Repeat echo as above  5. MRSA bacteremia Necrotizing fasciitis -S/p recent left BKA -On IV daptomycin  until 07/20 -Started on Unasyn  07/15 by primary team for worsening leukocytosis  6. PAD -hx SFA stents -On aspirin  + statin. Had been on plavix  but stopped d/t risk of bleeding  7. Anemia -Hgb 9s after 1 u RBCs on 07/14 -Hx GI bleed from AVMs, ? D/t aortic valve stenosis  8. AKI on CKD IIIa -In setting of volume overload/low-output -Inotrope support and diuresis  Length of Stay: 2  FINCH, LINDSAY N, PA-C  11/14/2023, 4:48 PM    Advanced Heart Failure Team Pager [phone] (M-F; 7a - 5p)  Please contact [facility] for night-coverage after hours (4p -7a ) and weekends on [email or web address]   Agree with above   51 y/o woman with 3v CAD, DM2, PAD s/p recent L BKA in setting of necrotizing fasciitis, CKD, moderate to severe AS, mod MR.  TEE 6/25 EF 55% mod-severe AS mod MR  Admitted with progressive respiratory failure and volume overload. Not responding to IV lasix .   Brought to cath lab today for RHC but developed acutely worsening SOB, orthopnea so procedure abandoned.   Cxr showed diffuse pulmonary edema. No response to high-dose lasix   General:  Sitting straight up. + dyspneic with conversation HEENT: normal Neck: supple. JVP to ear . Cor: PMI nondisplaced. Regular rate &  rhythm. No rubs, gallops or murmurs. Lungs: + crackles Abdomen: obese distended. Extremities: cool s/p LBA 3-4+ edema on R Neuro: alert & orientedx3, cranial nerves grossly intact. moves all 4 extremities w/o difficulty. Affect pleasant  She is in cardiogenic with florid pulmonary edema and no response to IV lasix    Have moved urgently to CCU. Will start Bipap, dobutamine  and lasix  gtt. Low threshold for intubation   Check co-ox, lactic acid.   CRITICAL CARE Performed by: Cherrie Sieving  Total critical care time: 50 minutes  Critical care time was exclusive of separately billable procedures and treating other patients.  Critical care was necessary to treat or prevent imminent or  life-threatening deterioration.  Critical care was time spent personally by me (independent of midlevel providers or residents) on the following activities: development of treatment plan with patient and/or surrogate as well as nursing, discussions with consultants, evaluation of patient's response to treatment, examination of patient, obtaining history from patient or surrogate, ordering and performing treatments and interventions, ordering and review of laboratory studies, ordering and review of radiographic studies, pulse oximetry and re-evaluation of patient's condition.  Toribio Fuel, MD  6:06 PM

## 2023-11-14 NOTE — Procedures (Signed)
 Intubation Procedure Note  Kayla Schmitt  [id number]  1965-10-29  Date:11/14/23  Time:5:16 PM   Provider Performing:Itzamar Traynor C Claudene    Procedure: Intubation (31500)  Indication(s) Respiratory Failure  Consent Verbal   Anesthesia Etomidate , Versed , Fentanyl , and Rocuronium    Time Out Verified patient identification, verified procedure, site/side was marked, verified correct patient position, special equipment/implants available, medications/allergies/relevant history reviewed, required imaging and test results available.   Sterile Technique Usual hand hygeine, masks, and gloves were used   Procedure Description Patient positioned in bed supine.  Sedation given as noted above.  Patient was intubated with endotracheal tube using Glidescope.  View was Grade 1 full glottis .  Number of attempts was 1.  Colorimetric CO2 detector was consistent with tracheal placement.   Complications/Tolerance None; patient tolerated the procedure well. Chest X-ray is ordered to verify placement.   EBL Minimal   Specimen(s) None

## 2023-11-14 NOTE — TOC CM/SW Note (Signed)
 Transition of Care [facility]) - Inpatient Brief Assessment   Patient Details  Name: Kayla Schmitt MRN: [id number] Date of Birth: [birth year]  Transition of Care [facility] CM/SW Contact:    [name], RN Phone Number: 11/14/2023, 10:17 AM   Clinical Narrative: From home alone, has PCP and insurance on file, states has no HH services in place at this time ,  w/chair, bsc, shower chair, walker at home.  States family member will transport them home at [employer] and family is support system,   Pta self ambulatory .   Transition of Care Asessment: Insurance and Status: Insurance coverage has been reviewed Patient has primary care physician: Yes Home environment has been reviewed: home alone Prior level of function:: indep Prior/Current Home Services: Current home services (w/chair, bsc, shower chair, walker) Social Drivers of Health Review: SDOH reviewed no interventions necessary Readmission risk has been reviewed: Yes Transition of care needs: no transition of care needs at this time

## 2023-11-14 NOTE — Consult Note (Signed)
 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  Stubborn diuresis so attempted RHC today but unable to lie flat.  CXR showing pulmonary edema, bringing to ICU for inotropes and BIPAP.  Full ROS as below, understandably anxious.  Pertinent  Medical History  CAD Aortic stenosis Class 3 obesity Nec fasc Hypothyroidism PVD DM2 GERD Anemia  Significant Hospital Events: Including procedures, antibiotic start and stop dates in addition to other pertinent events   7/16 admission 7/17 ICU  Interim History / Subjective:  consult  Objective    Blood pressure 120/69, pulse 96, temperature 98.2 F (36.8 C), temperature source Oral, resp. rate (!) 23, height 5' 4 (1.626 m), weight 91.7 kg, SpO2 90%.        Intake/Output Summary (Last 24 hours) at 11/14/2023 1552 Last data filed at 11/14/2023 1428 Gross per 24 hour  Intake 840 ml  Output 100 ml  Net 740 ml   Filed Weights   11/12/23 1800 11/13/23 0448 11/14/23 0438  Weight: 92.5 kg 91.7 kg 91.7 kg    Examination: General: sitting in bed mild respiratory difficulty HENT: malampatti 4, MMM Lungs: Diminished at bases Cardiovascular: distant due to body habitus, ext lukewarm Abdomen: soft Extremities: BKA dressed Neuro: Moves to command, AOx3 Skin: no rashes  Cr drifting up BNP up Trop up  Resolved problem list   Assessment and Plan  Decompensated valvular heart failure Acute hypoxemic respiratory failure secondary to pulmonary edema Ischemic cardiomyopathy Class 3 obesity AKI on CKD DM2 HTN Hypothyroidism PVD Hx recent nec fasc w/ BKA- on unasyn  and dapto  - BIPAP, ?CVL - Place foley -  Inotropes, diuretics per AHF - Continue abx as ordered - SSI  Best Practice (right click and Reselect all SmartList Selections daily)   Diet/type: NPO DVT prophylaxis prophylactic heparin   Pressure ulcer(s): N/A GI prophylaxis: PPI Lines: Central line Foley:  Yes, and it is still needed Code Status:  full code Last date of multidisciplinary goals of care discussion [updated mother at bedside]  Labs   CBC: Recent Labs  Lab 11/08/23 0410 11/09/23 0453 11/10/23 0305 11/11/23 0601 11/12/23 0405 11/13/23 0520 11/14/23 0502  WBC 7.2   < > 10.3 9.7 11.4* 14.0* 13.2*  NEUTROABS 4.0  --  6.8  --  7.7 10.2*  --   HGB 7.3*   < > 8.2* 7.7* 9.1* 9.2* 9.0*  HCT 23.4*   < > 26.4* 24.7* 29.5* 28.8* 28.9*  MCV 92.9   < > 90.4 91.5 91.9 89.7 90.3  PLT 301   < > 386 405* 496* 571* 510*   < > = values in this interval not displayed.    Basic Metabolic Panel: Recent Labs  Lab 11/10/23 0305 11/11/23 0601 11/12/23 0405 11/13/23 0520 11/14/23 0502  NA 136 135 135 135 136  K 4.0 4.0 4.4 4.3 3.5  CL 100 100 100 98 98  CO2 26 26 23 24 23   GLUCOSE 149* 172* 181* 176* 178*  BUN 32* 32* 34* 37* 40*  CREATININE 1.33* 1.28* 1.28* 1.37* 1.56*  CALCIUM  8.6* 8.5* 8.7* 9.0 8.8*  MG  --   --   --   --  1.8  GFR: Estimated Creatinine Clearance: 44.2 mL/min (A) (by C-G formula based on SCr of 1.56 mg/dL (H)). Recent Labs  Lab 11/11/23 0601 11/12/23 0404 11/12/23 0405 11/12/23 2133 11/13/23 0520 11/14/23 0502  PROCALCITON  --  0.27  --   --   --   --   WBC 9.7  --  11.4*  --  14.0* 13.2*  LATICACIDVEN  --   --   --  1.3  --   --     Liver Function Tests: Recent Labs  Lab 11/12/23 1921  AST 24  ALT 19  ALKPHOS 115  BILITOT 0.9  PROT 8.0  ALBUMIN  2.2*   No results for input(s): LIPASE, AMYLASE in the last 168 hours. No results for input(s): AMMONIA in the last 168 hours.  ABG    Component Value Date/Time   PHART 7.462 (H) 08/23/2022 0920   PCO2ART 39.1 08/23/2022  0920   PO2ART 59 (L) 08/23/2022 0920   HCO3 29.5 (H) 08/23/2022 0926   TCO2 31 08/23/2022 0926   O2SAT 59 08/23/2022 0926     Coagulation Profile: Recent Labs  Lab 11/12/23 1921  INR 1.3*    Cardiac Enzymes: Recent Labs  Lab 11/11/23 0601  CKTOTAL 106    HbA1C: Hgb A1c MFr Bld  Date/Time Value Ref Range Status  10/28/2023 07:40 PM 5.5 4.8 - 5.6 % Final    Comment:    (NOTE) Diagnosis of Diabetes The following HbA1c ranges recommended by the American Diabetes Association (ADA) may be used as an aid in the diagnosis of diabetes mellitus.  Hemoglobin             Suggested A1C NGSP%              Diagnosis  <5.7                   Non Diabetic  5.7-6.4                Pre-Diabetic  >6.4                   Diabetic  <7.0                   Glycemic control for                       adults with diabetes.    08/02/2022 08:48 PM 6.4 (H) 4.8 - 5.6 % Final    Comment:    (NOTE)         Prediabetes: 5.7 - 6.4         Diabetes: >6.4         Glycemic control for adults with diabetes: <7.0     CBG: Recent Labs  Lab 11/13/23 1124 11/13/23 1724 11/13/23 2110 11/14/23 0619 11/14/23 1157  GLUCAP 200* 150* 170* 182* 171*    Review of Systems:    Positive Symptoms in bold:  Constitutional fevers, chills, weight loss, fatigue, anorexia, malaise  Eyes decreased vision, double vision, eye irritation  Ears, Nose, Mouth, Throat sore throat, trouble swallowing, sinus congestion  Cardiovascular chest pain, paroxysmal nocturnal dyspnea, lower ext edema, palpitations   Respiratory SOB, cough, DOE, hemoptysis, wheezing  Gastrointestinal nausea, vomiting, diarrhea  Genitourinary burning with urination, trouble urinating  Musculoskeletal joint aches, joint swelling, back pain  Integumentary  rashes, skin lesions  Neurological focal weakness, focal numbness, trouble speaking, headaches  Psychiatric depression, anxiety, confusion  Endocrine polyuria, polydipsia, cold  intolerance,  heat intolerance  Hematologic abnormal bruising, abnormal bleeding, unexplained nose bleeds  Allergic/Immunologic recurrent infections, hives, swollen lymph nodes     Past Medical History:  She,  has a past medical history of Anemia, Aortic stenosis, Arthritis, CHF (congestive heart failure) (HCC), Coronary artery disease, Diabetes mellitus without complication (HCC), GERD (gastroesophageal reflux disease), Heart murmur, History of blood transfusion, Hypercholesteremia, Hypertension, Hypothyroidism, Mitral regurgitation, Myocardial infarction [facility]), Peripheral vascular disease (HCC), PONV (postoperative nausea and vomiting), and Thyroid disease.   Surgical History:   Past Surgical History:  Procedure Laterality Date   ABDOMINAL AORTOGRAM W/LOWER EXTREMITY Bilateral 01/14/2020   Procedure: ABDOMINAL AORTOGRAM W/LOWER EXTREMITY;  Surgeon: Gretta Lonni PARAS, MD;  Location: [facility];  Service: Cardiovascular;  Laterality: Bilateral;   ABDOMINAL AORTOGRAM W/LOWER EXTREMITY N/A 07/05/2022   Procedure: ABDOMINAL AORTOGRAM W/LOWER EXTREMITY;  Surgeon: Gretta Lonni PARAS, MD;  Location: [facility];  Service: Cardiovascular;  Laterality: N/A;   ABDOMINAL AORTOGRAM W/LOWER EXTREMITY N/A 08/02/2022   Procedure: ABDOMINAL AORTOGRAM W/LOWER EXTREMITY;  Surgeon: Gretta Lonni PARAS, MD;  Location: [facility];  Service: Cardiovascular;  Laterality: N/A;   ABDOMINAL AORTOGRAM W/LOWER EXTREMITY N/A 08/06/2022   Procedure: ABDOMINAL AORTOGRAM W/LOWER EXTREMITY;  Surgeon: Gretta Lonni PARAS, MD;  Location: [facility];  Service: Cardiovascular;  Laterality: N/A;   AMPUTATION Bilateral 07/20/2022   Procedure: AMPUTATION RIGHT GREAT TOE AND SECOND TOE, AMPUTATION LEFT GREAT TOE;  Surgeon: Harden Jerona GAILS, MD;  Location: [facility] OR;  Service: Orthopedics;  Laterality: Bilateral;   AMPUTATION Bilateral 08/08/2022   Procedure: BILATERAL TRANSMETATARSAL AMPUTATION;  Surgeon:  Harden Jerona GAILS, MD;  Location: [facility] OR;  Service: Orthopedics;  Laterality: Bilateral;   AMPUTATION Left 10/22/2023   Procedure: AMPUTATION BELOW KNEE;  Surgeon: Lanis Fonda BRAVO, MD;  Location: [facility] OR;  Service: Vascular;  Laterality: Left;   AMPUTATION TOE     BIOPSY  04/06/2023   Procedure: BIOPSY;  Surgeon: Zanaiya Elspeth SQUIBB, MD;  Location: [facility] ENDOSCOPY;  Service: Gastroenterology;;   COLONOSCOPY WITH PROPOFOL  N/A 04/06/2023   Procedure: COLONOSCOPY WITH PROPOFOL ;  Surgeon: Kristain Elspeth SQUIBB, MD;  Location: [facility] ENDOSCOPY;  Service: Gastroenterology;  Laterality: N/A;   ESOPHAGOGASTRODUODENOSCOPY (EGD) WITH PROPOFOL  N/A 04/06/2023   Procedure: ESOPHAGOGASTRODUODENOSCOPY (EGD) WITH PROPOFOL ;  Surgeon: Camary Elspeth SQUIBB, MD;  Location: [facility] ENDOSCOPY;  Service: Gastroenterology;  Laterality: N/A;   GIVENS CAPSULE STUDY N/A 04/06/2023   Procedure: GIVENS CAPSULE STUDY;  Surgeon: Yaqueline Elspeth SQUIBB, MD;  Location: [facility] ENDOSCOPY;  Service: Gastroenterology;  Laterality: N/A;   PERIPHERAL VASCULAR ATHERECTOMY Right 01/14/2020   Procedure: PERIPHERAL VASCULAR ATHERECTOMY;  Surgeon: Gretta Lonni PARAS, MD;  Location: [facility] INVASIVE CV LAB;  Service: Cardiovascular;  Laterality: Right;  sfa   PERIPHERAL VASCULAR INTERVENTION Right 01/14/2020   Procedure: PERIPHERAL VASCULAR INTERVENTION;  Surgeon: Gretta Lonni PARAS, MD;  [facility]: [facility];  Service: Cardiovascular;  Laterality: Right;  sfa stent    PERIPHERAL VASCULAR INTERVENTION  07/05/2022   Procedure: PERIPHERAL VASCULAR INTERVENTION;  Surgeon: Gretta Lonni PARAS, MD;  [facility];  Service: Cardiovascular;;   PERIPHERAL VASCULAR INTERVENTION  08/02/2022   Procedure: PERIPHERAL VASCULAR INTERVENTION;  Surgeon: Gretta Lonni PARAS, MD;  [facility]: [facility];  Service: Cardiovascular;;   PERIPHERAL VASCULAR INTERVENTION  08/06/2022   Procedure: PERIPHERAL VASCULAR INTERVENTION;  Surgeon: Gretta Lonni PARAS, MD;   Location: [facility] INVASIVE CV LAB;  Service: Cardiovascular;;   PERIPHERAL VASCULAR THROMBECTOMY  08/02/2022   Procedure: PERIPHERAL VASCULAR THROMBECTOMY;  Surgeon: Gretta Lonni PARAS, MD;  Location: [facility] INVASIVE CV LAB;  Service: Cardiovascular;;   REVISION AMPUTATION, BELOW THE KNEE Left 10/23/2023   Procedure: REVISION AMPUTATION, BELOW THE KNEE;  Surgeon: Harden Jerona GAILS, MD;  Location: [facility] OR;  Service: Orthopedics;  Laterality: Left;  REVISION LEFT BELOW KNEE AMPUTATION   RIGHT/LEFT HEART CATH AND CORONARY ANGIOGRAPHY N/A 08/23/2022   Procedure: RIGHT/LEFT HEART CATH AND CORONARY ANGIOGRAPHY;  Surgeon: Dann Candyce RAMAN, MD;  Location: [facility] INVASIVE CV LAB;  Service: Cardiovascular;  Laterality: N/A;   SKIN SPLIT GRAFT Right 03/18/2020   Procedure: SKIN GRAFTING RIGHT FOOT ULCER;  Surgeon: Harden Jerona GAILS, MD;  Location: [facility] OR;  Service: Orthopedics;  Laterality: Right;   STUMP REVISION Right 11/30/2022   Procedure: REVISION RIGHT TRANSMETATARSAL AMPUTATION;  Surgeon: Harden Jerona GAILS, MD;  Location: [facility] OR;  Service: Orthopedics;  Laterality: Right;   TEE WITHOUT CARDIOVERSION N/A 08/27/2022   Procedure: TRANSESOPHAGEAL ECHOCARDIOGRAM;  Surgeon: Hobart Powell BRAVO, MD;  Location: [facility] INVASIVE CV LAB;  Service: Cardiovascular;  Laterality: N/A;   TRANSESOPHAGEAL ECHOCARDIOGRAM (CATH LAB) N/A 10/24/2023   Procedure: TRANSESOPHAGEAL ECHOCARDIOGRAM;  Surgeon: Michele Richardson, DO;  Location: [facility];  Service: Cardiovascular;  Laterality: N/A;   WISDOM TOOTH EXTRACTION       Social History:   reports that she quit smoking about 6 years ago. Her smoking use included cigarettes. She has never used smokeless tobacco. She reports that she does not currently use alcohol. She reports current drug use. Drug: Marijuana.   Family History:  Her family history includes CAD in an other family member; Diabetes in an other family member.   Allergies Allergies  Allergen Reactions   Trental  [Pentoxifylline ]  Nausea And Vomiting   Vibramycin [Doxycycline] Nausea And Vomiting     Home Medications  Prior to Admission medications   Medication Sig Start Date End Date Taking? Authorizing Provider  acetaminophen  (TYLENOL ) 500 MG tablet Take 1,000 mg by mouth 2 (two) times daily as needed for moderate pain (pain score 4-6), headache or fever.   Yes [provider]  Alcohol Swabs  (PHARMACIST CHOICE ALCOHOL) PADS Apply topically. 04/09/23  Yes [provider]  aspirin  81 MG EC tablet Take 81 mg by mouth daily.   Yes [provider]  atorvastatin  (LIPITOR) 40 MG tablet Take 40 mg by mouth daily. 12/14/19  Yes [provider]  bisacodyl  (DULCOLAX) 10 MG suppository Place 1 suppository (10 mg total) rectally daily as needed for moderate constipation or mild constipation. 10/29/23  Yes Hongalgi, Anand D, MD  clopidogrel  (PLAVIX ) 75 MG tablet TAKE 1 TABLET BY MOUTH EVERY DAY WITH BREAKFAST 01/28/23  Yes Sheree Penne Lonni, MD  daptomycin  (CUBICIN ) IVPB Inject 700 mg into the vein daily for 6 days. Indication:  MRSA bacteremia First Dose: Yes Last Day of Therapy:  11/17/23 Labs - Once weekly:  CBC/D, BMP, and CPK Labs - Once weekly: ESR and CRP Method of administration: IV Push Method of administration may be changed at the discretion of home infusion pharmacist based upon assessment of the patient and/or caregiver's ability to self-administer the medication ordered. Followed by ID physician: Dr. Annalee Orem 11/11/23 11/17/23 Yes Love, Sharlet RAMAN, PA-C  diphenhydrAMINE  HCl, Sleep, (ZZZQUIL) 25 MG CAPS Take 25 mg by mouth at bedtime as needed (Sleep).   Yes [provider]  ferrous sulfate  325 (65 FE) MG EC tablet Take 1 tablet (325 mg total) by mouth daily with breakfast. 04/07/23 04/06/24 Yes Fairy Frames, MD  furosemide  (LASIX ) 40 MG tablet Take 1 tablet (40 mg total) by mouth 2 (two) times daily. 08/27/22  Yes Leotis Bogus, MD  gabapentin  (NEURONTIN ) 300 MG  capsule Take 300 mg by mouth at bedtime.   Yes [provider]  insulin  degludec (TRESIBA  FLEXTOUCH) 100 UNIT/ML FlexTouch Pen Inject 10 Units into the skin at bedtime. Patient taking differently: Inject 16 Units into the skin at bedtime. 01/15/20  Yes Danford, Lonni SQUIBB, MD  levothyroxine  (SYNTHROID ) 150 MCG tablet Take 150 mcg by mouth daily before breakfast. 12/02/19  Yes [provider]  metoprolol  succinate (TOPROL -XL) 25 MG 24 hr tablet Take 0.5 tablets (12.5 mg total) by mouth daily. 08/28/22  Yes Leotis Bogus, MD  oxyCODONE  (ROXICODONE ) 5 MG immediate release tablet Take 1 tablet (5 mg total) by mouth every 6 (six) hours as needed for severe pain (pain score 7-10). 10/15/23  Yes Francesca Elsie CROME, MD  pantoprazole  (PROTONIX ) 40 MG tablet Take 40 mg by mouth daily. 12/21/19  Yes [provider]  polyethylene glycol (MIRALAX  / GLYCOLAX ) 17 g packet Take 17 g by mouth 2 (two) times daily. 10/29/23  Yes Hongalgi, Anand D, MD  promethazine  (PHENERGAN ) 25 MG tablet Take 1 tablet (25 mg total) by mouth every 6 (six) hours as needed for nausea or vomiting. 10/15/23  Yes Francesca Elsie CROME, MD  senna-docusate (SENOKOT-S) 8.6-50 MG tablet Take 1 tablet by mouth 2 (two) times daily. 10/29/23  Yes Hongalgi, Anand D, MD  nitroGLYCERIN  (NITROSTAT ) 0.4 MG SL tablet Place 1 tablet (0.4 mg total) under the tongue every 5 (five) minutes x 3 doses as needed for chest pain (if no relief after 3rd dose proceed to ED or call 911). 11/06/2022-New 11/06/22   Mallipeddi, Vishnu P, MD     Critical care time: 31 mins independent of procedures

## 2023-11-14 NOTE — Progress Notes (Signed)
 PT Cancellation Note  Patient Details Name: Kayla Schmitt MRN: [id number] DOB: [birth year]   Cancelled Treatment:    Reason Eval/Treat Not Completed: Patient declined, no reason specified (pt sitting EOb with SPO2 92% stating SOB, unable to tolerate more at this time and awaiting news of heart cath)   Kendrah Lovern B Reighan Hipolito 11/14/2023, 9:05 AM Lenoard SQUIBB, PT Acute Rehabilitation Services Office: [phone]

## 2023-11-14 NOTE — Procedures (Signed)
 Central Venous Catheter Insertion Procedure Note  Kayla Schmitt  [id number]  01/09/1966  Date:11/14/23  Time:5:16 PM   Provider Performing:Granite Godman JAYSON Claudene   Procedure: Insertion of Non-tunneled Central Venous Catheter(36556) with US  guidance (23062)   Indication(s) Medication administration  Consent Verbal  Anesthesia Topical only with 1% lidocaine    Timeout Verified patient identification, verified procedure, site/side was marked, verified correct patient position, special equipment/implants available, medications/allergies/relevant history reviewed, required imaging and test results available.  Sterile Technique Maximal sterile technique including full sterile barrier drape, hand hygiene, sterile gown, sterile gloves, mask, hair covering, sterile ultrasound probe cover (if used).  Procedure Description Area of catheter insertion was cleaned with chlorhexidine  and draped in sterile fashion.  With real-time ultrasound guidance a central venous catheter was placed into the left internal jugular vein. Nonpulsatile blood flow and easy flushing noted in all ports.  The catheter was sutured in place and sterile dressing applied.  Complications/Tolerance None; patient tolerated the procedure well. Chest X-ray is ordered to verify placement for internal jugular or subclavian cannulation.   Chest x-ray is not ordered for femoral cannulation.  EBL Minimal  Specimen(s) None

## 2023-11-14 NOTE — Progress Notes (Signed)
 eLink Physician-Brief Progress Note Patient Name: Kayla Schmitt DOB: 12/22/1965 MRN: [id number]   Date of Service  11/14/2023  HPI/Events of Note  58 y.o. 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   Presented with acute on chronic heart failure.  Currently on low-dose Precedex , dobutamine  infusion, and mechanically ventilated  Updated electrolytes show mild hypokalemia.  Central venous saturation 85%.  eICU Interventions  KCl ordered Defer inotropic management to heart failure   0315 -patient is having frequent peak pressure and alarms.  Escalating doses of fentanyl  to alleviate dyssynchrony.  Adjusted the ventilator by dropping to 60 cc/kg, patient is overbreathing to compensate with equal minute ventilation, reduce peak pressures overall.  Add scheduled SVNs  Intervention Category Minor Interventions: Electrolytes abnormality - evaluation and management  Kayla Schmitt 11/14/2023, 8:28 PM

## 2023-11-14 NOTE — Plan of Care (Signed)
  Problem: Education: Goal: Knowledge of General Education information will improve Description: Including pain rating scale, medication(s)/side effects and non-pharmacologic comfort measures Outcome: Progressing   Problem: Health Behavior/Discharge Planning: Goal: Ability to manage health-related needs will improve Outcome: Progressing   Problem: Clinical Measurements: Goal: Ability to maintain clinical measurements within normal limits will improve Outcome: Progressing Goal: Will remain free from infection Outcome: Progressing Goal: Diagnostic test results will improve Outcome: Progressing Goal: Respiratory complications will improve Outcome: Progressing Goal: Cardiovascular complication will be avoided Outcome: Progressing   Problem: Activity: Goal: Risk for activity intolerance will decrease Outcome: Progressing   Problem: Nutrition: Goal: Adequate nutrition will be maintained Outcome: Progressing   Problem: Coping: Goal: Level of anxiety will decrease Outcome: Progressing   Problem: Elimination: Goal: Will not experience complications related to bowel motility Outcome: Progressing Goal: Will not experience complications related to urinary retention Outcome: Progressing   Problem: Pain Managment: Goal: General experience of comfort will improve and/or be controlled Outcome: Progressing   Problem: Skin Integrity: Goal: Risk for impaired skin integrity will decrease Outcome: Progressing   Problem: Safety: Goal: Ability to remain free from injury will improve Outcome: Progressing   Problem: Education: Goal: Ability to describe self-care measures that may prevent or decrease complications (Diabetes Survival Skills Education) will improve Outcome: Progressing Goal: Individualized Educational Video(s) Outcome: Progressing   Problem: Fluid Volume: Goal: Ability to maintain a balanced intake and output will improve Outcome: Progressing   Problem:  Metabolic: Goal: Ability to maintain appropriate glucose levels will improve Outcome: Progressing   Problem: Cardiovascular: Goal: Ability to achieve and maintain adequate cardiovascular perfusion will improve Outcome: Progressing Goal: Vascular access site(s) Level 0-1 will be maintained Outcome: Progressing

## 2023-11-15 ENCOUNTER — Encounter (HOSPITAL_COMMUNITY): Payer: Self-pay | Admitting: Internal Medicine

## 2023-11-15 ENCOUNTER — Inpatient Hospital Stay (HOSPITAL_COMMUNITY)

## 2023-11-15 DIAGNOSIS — I350 Nonrheumatic aortic (valve) stenosis: Secondary | ICD-10-CM

## 2023-11-15 DIAGNOSIS — I5033 Acute on chronic diastolic (congestive) heart failure: Secondary | ICD-10-CM

## 2023-11-15 DIAGNOSIS — I10 Essential (primary) hypertension: Secondary | ICD-10-CM

## 2023-11-15 DIAGNOSIS — I5031 Acute diastolic (congestive) heart failure: Secondary | ICD-10-CM

## 2023-11-15 DIAGNOSIS — J9621 Acute and chronic respiratory failure with hypoxia: Secondary | ICD-10-CM

## 2023-11-15 DIAGNOSIS — I25118 Atherosclerotic heart disease of native coronary artery with other forms of angina pectoris: Secondary | ICD-10-CM

## 2023-11-15 DIAGNOSIS — R7881 Bacteremia: Secondary | ICD-10-CM

## 2023-11-15 DIAGNOSIS — J810 Acute pulmonary edema: Secondary | ICD-10-CM

## 2023-11-15 DIAGNOSIS — J9602 Acute respiratory failure with hypercapnia: Secondary | ICD-10-CM

## 2023-11-15 DIAGNOSIS — N1831 Chronic kidney disease, stage 3a: Secondary | ICD-10-CM

## 2023-11-15 LAB — CBC
HCT: 25.5 % — ABNORMAL LOW (ref 36.0–46.0)
Hemoglobin: 7.7 g/dL — ABNORMAL LOW (ref 12.0–15.0)
MCH: 27.7 pg (ref 26.0–34.0)
MCHC: 30.2 g/dL (ref 30.0–36.0)
MCV: 91.7 fL (ref 80.0–100.0)
Platelets: 403 K/uL — ABNORMAL HIGH (ref 150–400)
RBC: 2.78 MIL/uL — ABNORMAL LOW (ref 3.87–5.11)
RDW: 16.5 % — ABNORMAL HIGH (ref 11.5–15.5)
WBC: 10.3 K/uL (ref 4.0–10.5)
nRBC: 0 % (ref 0.0–0.2)

## 2023-11-15 LAB — CBC WITH DIFFERENTIAL/PLATELET
Abs Immature Granulocytes: 0.16 K/uL — ABNORMAL HIGH (ref 0.00–0.07)
Basophils Absolute: 0.1 K/uL (ref 0.0–0.1)
Basophils Relative: 0 %
Eosinophils Absolute: 0.5 K/uL (ref 0.0–0.5)
Eosinophils Relative: 3 %
HCT: 25.9 % — ABNORMAL LOW (ref 36.0–46.0)
Hemoglobin: 7.8 g/dL — ABNORMAL LOW (ref 12.0–15.0)
Immature Granulocytes: 1 %
Lymphocytes Relative: 7 %
Lymphs Abs: 1 K/uL (ref 0.7–4.0)
MCH: 28 pg (ref 26.0–34.0)
MCHC: 30.1 g/dL (ref 30.0–36.0)
MCV: 92.8 fL (ref 80.0–100.0)
Monocytes Absolute: 1.4 K/uL — ABNORMAL HIGH (ref 0.1–1.0)
Monocytes Relative: 9 %
Neutro Abs: 12.2 K/uL — ABNORMAL HIGH (ref 1.7–7.7)
Neutrophils Relative %: 80 %
Platelets: 531 K/uL — ABNORMAL HIGH (ref 150–400)
RBC: 2.79 MIL/uL — ABNORMAL LOW (ref 3.87–5.11)
RDW: 16.6 % — ABNORMAL HIGH (ref 11.5–15.5)
WBC: 15.3 K/uL — ABNORMAL HIGH (ref 4.0–10.5)
nRBC: 0 % (ref 0.0–0.2)

## 2023-11-15 LAB — POCT I-STAT 7, (LYTES, BLD GAS, ICA,H+H)
Acid-base deficit: 1 mmol/L (ref 0.0–2.0)
Bicarbonate: 24.2 mmol/L (ref 20.0–28.0)
Calcium, Ion: 1.19 mmol/L (ref 1.15–1.40)
HCT: 24 % — ABNORMAL LOW (ref 36.0–46.0)
Hemoglobin: 8.2 g/dL — ABNORMAL LOW (ref 12.0–15.0)
O2 Saturation: 98 %
Patient temperature: 38.6
Potassium: 4.2 mmol/L (ref 3.5–5.1)
Sodium: 139 mmol/L (ref 135–145)
TCO2: 25 mmol/L (ref 22–32)
pCO2 arterial: 45.2 mmHg (ref 32–48)
pH, Arterial: 7.343 — ABNORMAL LOW (ref 7.35–7.45)
pO2, Arterial: 117 mmHg — ABNORMAL HIGH (ref 83–108)

## 2023-11-15 LAB — RETICULOCYTES
Immature Retic Fract: 17.6 % — ABNORMAL HIGH (ref 2.3–15.9)
RBC.: 2.78 MIL/uL — ABNORMAL LOW (ref 3.87–5.11)
Retic Count, Absolute: 68.7 K/uL (ref 19.0–186.0)
Retic Ct Pct: 2.5 % (ref 0.4–3.1)

## 2023-11-15 LAB — BASIC METABOLIC PANEL WITH GFR
Anion gap: 15 (ref 5–15)
Anion gap: 13 (ref 5–15)
BUN: 45 mg/dL — ABNORMAL HIGH (ref 6–20)
BUN: 48 mg/dL — ABNORMAL HIGH (ref 6–20)
CO2: 24 mmol/L (ref 22–32)
CO2: 22 mmol/L (ref 22–32)
Calcium: 8.4 mg/dL — ABNORMAL LOW (ref 8.9–10.3)
Calcium: 8.5 mg/dL — ABNORMAL LOW (ref 8.9–10.3)
Chloride: 103 mmol/L (ref 98–111)
Chloride: 102 mmol/L (ref 98–111)
Creatinine, Ser: 2.18 mg/dL — ABNORMAL HIGH (ref 0.44–1.00)
Creatinine, Ser: 2.54 mg/dL — ABNORMAL HIGH (ref 0.44–1.00)
GFR, Estimated: 26 mL/min — ABNORMAL LOW (ref >60)
GFR, Estimated: 22 mL/min — ABNORMAL LOW (ref >60)
Glucose, Bld: 169 mg/dL — ABNORMAL HIGH (ref 70–99)
Glucose, Bld: 221 mg/dL — ABNORMAL HIGH (ref 70–99)
Potassium: 3.9 mmol/L (ref 3.5–5.1)
Potassium: 4.6 mmol/L (ref 3.5–5.1)
Sodium: 142 mmol/L (ref 135–145)
Sodium: 137 mmol/L (ref 135–145)

## 2023-11-15 LAB — COOXEMETRY PANEL
Carboxyhemoglobin: 1.9 % — ABNORMAL HIGH (ref 0.5–1.5)
Methemoglobin: <0.7 % (ref 0.0–1.5)
O2 Saturation: 83.5 %
Total hemoglobin: 8 g/dL — ABNORMAL LOW (ref 12.0–16.0)

## 2023-11-15 LAB — ECHOCARDIOGRAM COMPLETE
AR max vel: 0.99 cm2
AV Area VTI: 0.97 cm2
AV Area mean vel: 0.89 cm2
AV Mean grad: 18 mmHg
AV Peak grad: 28.3 mmHg
Ao pk vel: 2.66 m/s
Area-P 1/2: 6.54 cm2
Est EF: 25
Height: 64 in
S' Lateral: 4.4 cm
Weight: 3262.81 [oz_av]

## 2023-11-15 LAB — GLUCOSE, CAPILLARY
Glucose-Capillary: 166 mg/dL — ABNORMAL HIGH (ref 70–99)
Glucose-Capillary: 174 mg/dL — ABNORMAL HIGH (ref 70–99)
Glucose-Capillary: 214 mg/dL — ABNORMAL HIGH (ref 70–99)
Glucose-Capillary: 249 mg/dL — ABNORMAL HIGH (ref 70–99)
Glucose-Capillary: 253 mg/dL — ABNORMAL HIGH (ref 70–99)

## 2023-11-15 LAB — MAGNESIUM: Magnesium: 1.7 mg/dL (ref 1.7–2.4)

## 2023-11-15 MED ORDER — POLYETHYLENE GLYCOL 3350 17 G PO PACK
17.0000 g | PACK | Freq: Two times a day (BID) | ORAL | Status: DC
  Administered 2023-11-15 – 2023-11-17 (×4): 17 g
  Filled 2023-11-15 (×3): qty 1

## 2023-11-15 MED ORDER — MAGNESIUM SULFATE 4 GM/100ML IV SOLN
4.0000 g | Freq: Once | INTRAVENOUS | Status: AC
  Administered 2023-11-15: 4 g via INTRAVENOUS
  Filled 2023-11-15: qty 100

## 2023-11-15 MED ORDER — LEVOTHYROXINE SODIUM 75 MCG PO TABS
150.0000 ug | ORAL_TABLET | Freq: Every day | ORAL | Status: DC
  Administered 2023-11-16 – 2023-11-17 (×2): 150 ug
  Filled 2023-11-15 (×2): qty 2

## 2023-11-15 MED ORDER — VASOPRESSIN 20 UNITS/100 ML INFUSION FOR SHOCK
0.0000 [IU]/min | INTRAVENOUS | Status: DC
  Administered 2023-11-15 – 2023-11-18 (×7): 0.03 [IU]/min via INTRAVENOUS
  Filled 2023-11-15 (×5): qty 100

## 2023-11-15 MED ORDER — FENTANYL CITRATE PF 50 MCG/ML IJ SOSY
25.0000 ug | PREFILLED_SYRINGE | INTRAMUSCULAR | Status: DC | PRN
  Administered 2023-11-15 – 2023-11-16 (×2): 50 ug via INTRAVENOUS
  Filled 2023-11-15 (×2): qty 1

## 2023-11-15 MED ORDER — VITAL AF 1.2 CAL PO LIQD
1000.0000 mL | ORAL | Status: DC
  Administered 2023-11-15: 1000 mL

## 2023-11-15 MED ORDER — IPRATROPIUM-ALBUTEROL 0.5-2.5 (3) MG/3ML IN SOLN
3.0000 mL | Freq: Four times a day (QID) | RESPIRATORY_TRACT | Status: DC
  Administered 2023-11-15 – 2023-11-20 (×19): 3 mL via RESPIRATORY_TRACT
  Filled 2023-11-15 (×21): qty 3

## 2023-11-15 MED ORDER — METOLAZONE 2.5 MG PO TABS
2.5000 mg | ORAL_TABLET | Freq: Once | ORAL | Status: DC
  Filled 2023-11-15: qty 1

## 2023-11-15 MED ORDER — ORAL CARE MOUTH RINSE
15.0000 mL | OROMUCOSAL | Status: AC | PRN
Start: 2023-11-15 — End: ?

## 2023-11-15 MED ORDER — ORAL CARE MOUTH RINSE
15.0000 mL | OROMUCOSAL | Status: DC
  Administered 2023-11-15 – 2023-11-17 (×30): 15 mL via OROMUCOSAL

## 2023-11-15 MED ORDER — GABAPENTIN 300 MG PO CAPS
300.0000 mg | ORAL_CAPSULE | Freq: Every day | ORAL | Status: DC
  Administered 2023-11-15 – 2023-11-17 (×3): 300 mg
  Filled 2023-11-15 (×3): qty 1

## 2023-11-15 MED ORDER — ASPIRIN 81 MG PO CHEW
81.0000 mg | CHEWABLE_TABLET | Freq: Every day | ORAL | Status: DC
  Administered 2023-11-15 – 2023-11-17 (×3): 81 mg
  Filled 2023-11-15 (×3): qty 1

## 2023-11-15 MED ORDER — NOREPINEPHRINE 4 MG/250ML-% IV SOLN
0.0000 ug/min | INTRAVENOUS | Status: DC
  Administered 2023-11-15: 16 ug/min via INTRAVENOUS
  Administered 2023-11-15: 20 ug/min via INTRAVENOUS
  Administered 2023-11-15: 2 ug/min via INTRAVENOUS
  Administered 2023-11-16: 16 ug/min via INTRAVENOUS
  Administered 2023-11-16 (×2): 8 ug/min via INTRAVENOUS
  Administered 2023-11-17: 7 ug/min via INTRAVENOUS
  Administered 2023-11-18: 3 ug/min via INTRAVENOUS
  Administered 2023-11-18: 5 ug/min via INTRAVENOUS
  Administered 2023-11-20: 3 ug/min via INTRAVENOUS
  Administered 2023-11-21: 1 ug/min via INTRAVENOUS
  Filled 2023-11-15 (×11): qty 250

## 2023-11-15 MED ORDER — METOLAZONE 2.5 MG PO TABS
2.5000 mg | ORAL_TABLET | Freq: Once | ORAL | Status: AC
  Administered 2023-11-15: 2.5 mg

## 2023-11-15 MED ORDER — SODIUM CHLORIDE 0.9 % IV SOLN: INTRAVENOUS | Status: AC | PRN

## 2023-11-15 MED ORDER — SENNOSIDES-DOCUSATE SODIUM 8.6-50 MG PO TABS
1.0000 | ORAL_TABLET | Freq: Two times a day (BID) | ORAL | Status: DC
  Administered 2023-11-15 – 2023-11-17 (×6): 1
  Filled 2023-11-15 (×6): qty 1

## 2023-11-15 MED ORDER — LINEZOLID 600 MG/300ML IV SOLN
600.0000 mg | Freq: Two times a day (BID) | INTRAVENOUS | Status: DC
  Administered 2023-11-15: 600 mg via INTRAVENOUS
  Filled 2023-11-15 (×2): qty 300

## 2023-11-15 MED ORDER — PROSOURCE TF20 ENFIT COMPATIBL EN LIQD
60.0000 mL | Freq: Every day | ENTERAL | Status: DC
  Administered 2023-11-15 – 2023-11-17 (×3): 60 mL
  Filled 2023-11-15 (×3): qty 60

## 2023-11-15 MED ORDER — FUROSEMIDE 10 MG/ML IJ SOLN
80.0000 mg | Freq: Once | INTRAMUSCULAR | Status: AC
  Administered 2023-11-15: 80 mg via INTRAVENOUS
  Filled 2023-11-15: qty 8

## 2023-11-15 MED ORDER — INSULIN ASPART 100 UNIT/ML IJ SOLN
0.0000 [IU] | INTRAMUSCULAR | Status: DC
  Administered 2023-11-15: 3 [IU] via SUBCUTANEOUS
  Administered 2023-11-16 (×3): 2 [IU] via SUBCUTANEOUS

## 2023-11-15 MED ORDER — PROPOFOL 1000 MG/100ML IV EMUL
0.0000 ug/kg/min | INTRAVENOUS | Status: DC
  Administered 2023-11-15: 10 ug/kg/min via INTRAVENOUS
  Administered 2023-11-15 – 2023-11-16 (×3): 30 ug/kg/min via INTRAVENOUS
  Administered 2023-11-16: 15 ug/kg/min via INTRAVENOUS
  Administered 2023-11-16: 35 ug/kg/min via INTRAVENOUS
  Administered 2023-11-16: 25 ug/kg/min via INTRAVENOUS
  Administered 2023-11-17 (×2): 35 ug/kg/min via INTRAVENOUS
  Filled 2023-11-15 (×6): qty 100

## 2023-11-15 MED ORDER — ATORVASTATIN CALCIUM 40 MG PO TABS
40.0000 mg | ORAL_TABLET | Freq: Every day | ORAL | Status: DC
  Administered 2023-11-15 – 2023-11-17 (×3): 40 mg
  Filled 2023-11-15 (×2): qty 1

## 2023-11-15 MED ORDER — PERFLUTREN LIPID MICROSPHERE
1.0000 mL | INTRAVENOUS | Status: AC | PRN
  Administered 2023-11-15: 2 mL via INTRAVENOUS

## 2023-11-15 MED ORDER — METOLAZONE 2.5 MG PO TABS
2.5000 mg | ORAL_TABLET | Freq: Once | ORAL | Status: AC
  Administered 2023-11-15: 2.5 mg via ORAL
  Filled 2023-11-15: qty 1

## 2023-11-15 MED ORDER — PIPERACILLIN-TAZOBACTAM 3.375 G IVPB
3.3750 g | Freq: Three times a day (TID) | INTRAVENOUS | Status: DC
  Administered 2023-11-15 – 2023-11-17 (×8): 3.375 g via INTRAVENOUS
  Filled 2023-11-15 (×8): qty 50

## 2023-11-15 NOTE — Progress Notes (Signed)
 11am Received a call from the Cath Lab stating they were unable to perform RHC because patient was unable to lie flat. Cath Lab staff placed patient on a non-rebreather mask due to shortness of breath.  Upon return to the unit, patient was noted to be anxious and short of breath, sitting upright at the bedside. After a few minutes, patient became less anxious and was transitioned back to a nasal cannula at 7L, maintaining an oxygen  saturation of 96%.   Dr. Kate at the bedside to assess patient. Orders were received for 80 mg IV Lasix  and a chest X-ray.   1530 Patient continues to have shortness of breath. Low urine output after 80 mg IV lasix .Received orders to move patient to 2H.

## 2023-11-15 NOTE — Progress Notes (Signed)
 Heart Failure Navigator Progress Note  Assessed for Heart & Vascular TOC clinic readiness.  Patient does not meet criteria due to Advanced Heart Failure team was consulted. No HF TOC. .   Navigator will sign off at this time.   Stephane Haddock, BSN, Scientist, clinical (histocompatibility and immunogenetics) Only

## 2023-11-15 NOTE — Plan of Care (Signed)
 Patient remains on the ventilator at this time. Less anxiety today. Wakes up calmer. Discontinued fentanyl  drip and started on propofol  drip. Dobutamine  increased to 5 mcg/kg with good co-oximetry today. Furosemide  increased to 30mg  with barely any response. Remains febrile today with tylenol  every 6 hours given. Started on new antibiotics and previous antibiotics discontinued. No BM today. Meds started to help have a BM. Tube feeding started via OG.

## 2023-11-15 NOTE — Progress Notes (Signed)
 Advanced Heart Failure Rounding Note  Cardiologist: Vishnu SHAUNNA Maywood, MD  Chief Complaint: Acute on chronic HFpEF / RV Failure Subjective:    On DBA 2.5.  745cc UOP on Lasix  20/hr. sCr 1.78>2.18 Intubated overnight. FiO2 0.60. CXR with significantly increased lung opacities Procal 1.36. Febrile Tmax 101.3. On Unasyn  and Dapto Echo read pending.  Sedated/Intubated.  Objective:    Weight Range: 92.5 kg Body mass index is 35 kg/m.   Vital Signs:   Temp:  [97.3 F (36.3 C)-101.3 F (38.5 C)] 101.3 F (38.5 C) (07/18 0530) Pulse Rate:  [86-118] 112 (07/18 0530) Resp:  [11-37] 26 (07/18 0530) BP: (100-126)/(59-98) 100/59 (07/18 0530) SpO2:  [90 %-100 %] 100 % (07/18 0530) FiO2 (%):  [60 %-100 %] 60 % (07/18 0148) Weight:  [92.5 kg] 92.5 kg (07/18 0500) Last BM Date : 11/13/23  Weight change: Filed Weights   11/13/23 0448 11/14/23 0438 11/15/23 0500  Weight: 91.7 kg 91.7 kg 92.5 kg   Intake/Output:  Intake/Output Summary (Last 24 hours) at 11/15/2023 0721 Last data filed at 11/15/2023 0539 Gross per 24 hour  Intake 1546.81 ml  Output 745 ml  Net 801.81 ml    Physical Exam    CVP 12 General: Acutely-ill appearing. On vent. Cardiac:  S1 and S2 present. Tachycardic Extremities: Warm and dry.  Trace BLE edema.  Neuro: Sedated  Telemetry   ST 110s (personally reviewed)  Labs    CBC Recent Labs    11/13/23 0520 11/14/23 0502 11/14/23 1752 11/15/23 0455  WBC 14.0* 13.2*  --  10.3  NEUTROABS 10.2*  --   --   --   HGB 9.2* 9.0* 8.5* 7.7*  HCT 28.8* 28.9* 25.0* 25.5*  MCV 89.7 90.3  --  91.7  PLT 571* 510*  --  403*   Basic Metabolic Panel Recent Labs    92/82/74 0502 11/14/23 1752 11/14/23 1833 11/15/23 0455  NA 136   < > 136 142  K 3.5   < > 3.6 3.9  CL 98  --  100 103  CO2 23  --  24 24  GLUCOSE 178*  --  205* 169*  BUN 40*  --  45* 45*  CREATININE 1.56*  --  1.78* 2.18*  CALCIUM  8.8*  --  8.6* 8.4*  MG 1.8  --   --   --    < > =  values in this interval not displayed.   Liver Function Tests Recent Labs    11/12/23 1921 11/14/23 1833  AST 24 16  ALT 19 16  ALKPHOS 115 120  BILITOT 0.9 0.7  PROT 8.0 7.2  ALBUMIN  2.2* 2.2*   BNP (last 3 results) Recent Labs    11/03/23 0112 11/07/23 1457 11/13/23 0521  BNP 793.8* 336.4* 1,626.5*   Medications:    Scheduled Medications:  aspirin  EC  81 mg Oral Daily   atorvastatin   40 mg Oral Daily   Chlorhexidine  Gluconate Cloth  6 each Topical Daily   docusate  100 mg Per Tube BID   ferrous sulfate   325 mg Oral Q breakfast   gabapentin   300 mg Oral QHS   heparin  injection (subcutaneous)  5,000 Units Subcutaneous Q8H   insulin  aspart  0-6 Units Subcutaneous TID WC   ipratropium-albuterol  3 mL Nebulization Q6H   levothyroxine   150 mcg Oral QAC breakfast   mouth rinse  15 mL Mouth Rinse Q2H   pantoprazole  (PROTONIX ) IV  40 mg Intravenous QHS   polyethylene  glycol  17 g Oral BID   senna-docusate  1 tablet Oral BID   sodium chloride  flush  10-40 mL Intracatheter Q12H   Infusions:  ampicillin -sulbactam (UNASYN ) IV Stopped (11/15/23 0449)   DAPTOmycin  Stopped (11/14/23 1529)   dexmedetomidine  (PRECEDEX ) IV infusion 1.2 mcg/kg/hr (11/15/23 0650)   DOBUTamine  2.5 mcg/kg/min (11/15/23 0539)   fentaNYL  infusion INTRAVENOUS 125 mcg/hr (11/15/23 0539)   furosemide  (LASIX ) 200 mg in dextrose  5 % 100 mL (2 mg/mL) infusion 20 mg/hr (11/15/23 0539)   PRN Medications: acetaminophen , fentaNYL , methocarbamol , mouth rinse, oxyCODONE -acetaminophen , prochlorperazine , sodium chloride  flush  Patient Profile   58 y.o. female with history of PAD with prior SFA stents and bilateral TMA, severe 3v CAD, MR/AS, DM II, anemia, HTN, HLD, hypothyroidism.    Admitted in 06/25 with MRSA bactermia and necrotizing fasciitis s/p left BKA. Transferred back to [facility] from rehab d/t acute on chronic CHF with concern for low-output and acute respiratory failure.  Assessment/Plan   Acute on  chronic HFpEF>Cardiogenic Shock - TTE 12/24: EF 40-45% - TTE 6/25: EF 50-55% - TEE 6/25: EF 60-65%, RV okay, mild to moderate MR, moderate to severe AS with mean gradient 27 mmHg and AVA 1.07 cm2 - Taken for RHC 7/17, however developed flash pulmonary edema. Unable to lie flat for procedure.  - Significantly volume overloaded on exam. CXR with diffuse edema throughout. Has not diuresed well with diuretic attempts.  - Increase DBA to 5 mcg/kg/min to assist w renal perfusion - Rebolus 120 mg IV Lasix  + increase gtt to 30/hr.  - Give dose 5 mg Metolazone - Echo completed, read pending.     2. Acute respiratory failure with hypoxia - CXR with b/l diffuse airspace opacities, likely pulmonary edema - Now intubated, FiO2 0.6, PEEP 8 - Need aggressive diuresis (see above)    3. CAD - NSTEMI in 4/24. Multivessel CAD. CABG had been recommended but managed medically d/t other active medical issues - continue aspirin  + statin   4. Valvular heart disease - Mild to moderate MR and moderate to severe AS on echo 6/25 - repeat echo as above   5. 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 PAD - hx SFA stents - On aspirin  + statin. Had been on plavix  but stopped d/t risk of bleeding   7. Anemia - s/p 1 u RBC 7/14 - Hx GI bleed from AVMs, ? D/t aortic valve stenosis - hgb 7.7 today, will repeat with afternoon labs.   8. AKI on CKD IIIa - In setting of volume overload/low-output - Cr up to 2.18 today, repeat BMP this afternoon - continue DBA and Lasix  gtt  Length of Stay: 3  CRITICAL CARE Performed by: [place] Adelisa Satterwhite  Total critical care time: 12 minutes  -Critical care time was exclusive of separately billable procedures and treating other patients. -Critical care was necessary to treat or prevent imminent or life-threatening deterioration. -Critical care was time spent personally  by me on the following activities: development of treatment plan with patient and/or surrogate as well as nursing, discussions with consultants, evaluation of patient's response to treatment, examination of patient, obtaining history from patient or surrogate, ordering and performing treatments and interventions, ordering and review of laboratory studies, ordering and review of radiographic studies, pulse oximetry and re-evaluation of patient's condition.  [place] Philip Kotlyar, NP  11/15/2023, 7:21 AM  Advanced Heart Failure Team  Pager [phone] (M-F; 7a - 5p)  Please contact [facility] for night-coverage after hours (5p -7a ) and weekends on [email or web address]

## 2023-11-15 NOTE — Progress Notes (Signed)
 PT Cancellation Note  Patient Details Name: Kayla Schmitt MRN: [id number] DOB: July 04, 1965   Cancelled Treatment:    Reason Eval/Treat Not Completed: Other (comment)  Noted significant change in status. Now intubated. Spoke with RN. Will hold formal evaluation today. If doing better tomorrow, will attempt as tolerated, otherwise we sign off and await improvement in medical status.  Leontine Roads, PT, DPT [facility]  Rehabilitation Services Physical Therapist Office: [phone] Website: [email or web address]   Leontine GORMAN Roads 11/15/2023, 8:34 AM

## 2023-11-15 NOTE — Progress Notes (Signed)
 Initial Nutrition Assessment  DOCUMENTATION CODES:   Not applicable  INTERVENTION:   Tube Feeding via OG:  Vital AF 1.2 at 55 ml/hr Begin TF at rate of 25 ml/hr, titrate by 10 mL q 6 hours until goal rate of 55 ml/hr Pro-Source TF20 60 mL Daily TF at goal provides 1664 kcals, 119 g of protein and 1069 mL of free water   NUTRITION DIAGNOSIS:   Inadequate oral intake related to acute illness as evidenced by NPO status.  Progressing   GOAL:   Patient will meet greater than or equal to 90% of their needs  Continues   MONITOR:   Vent status, Labs, Weight trends, TF tolerance, Skin  REASON FOR ASSESSMENT:   Consult, Ventilator Enteral/tube feeding initiation and management  ASSESSMENT:   58 yo female admitted with acute on chronic diastolic HF, moderate MR, moderate to severe aortic stenosis, MRSA bacteremia, acute respiratory failure on vent. PMH includes recent L BKA due to [employer], R TMA, HTN, HLD, DM, GERD, CAD, PVD, CHF  7/15 Admitted from CIR 7/17 Transferred to ICU, placed on BiPap and inotrope support, later intubated  NPO, OG   Pt with recent admission for nec fasc requiring L BKA (stiches remain in place)  Deep pitting edema in RLE below knee, generalized   Current wt 92.5 kg  On IV daptomycin  until 7/20, started on Unasyn  on 7/15  Eating well recently while at CIR; Pt was on oral nutrition supplements including Juven for wound healing and Vit C and Zinc  plus MVI  Labs: CBGs 166-203 (140-180 goal) BUN 45 Creatinine 2.18 Sodium 142 (wdl) Potassium 3.9 (wdl)  Meds:  Lasix  gtt SS novolog  Senna-Docusate   NUTRITION - FOCUSED PHYSICAL EXAM:  Flowsheet Row Most Recent Value  Orbital Region Mild depletion  Upper Arm Region Unable to assess  Thoracic and Lumbar Region Unable to assess  Buccal Region Unable to assess  Temple Region Mild depletion  Clavicle Bone Region Mild depletion  Clavicle and Acromion Bone Region Mild depletion  Scapular  Bone Region Mild depletion  Dorsal Hand Unable to assess  Patellar Region Mild depletion  Anterior Thigh Region Mild depletion  Posterior Calf Region Unable to assess  [L BKA, R with deep pitting edema]  Edema (RD Assessment) Moderate    Diet Order:   Diet Order             Diet NPO time specified  Diet effective now                   EDUCATION NEEDS:   Not appropriate for education at this time  Skin:  Skin Integrity Issues:: Incisions Incisions: Recent L BKA, stitches still in place  Last BM:  7/18  Height:   Ht Readings from Last 1 Encounters:  11/12/23 5' 4 (1.626 m)    Weight:   Wt Readings from Last 1 Encounters:  11/15/23 92.5 kg    BMI:  Body mass index is 35 kg/m.  Estimated Nutritional Needs:   Kcal:  1650-1850 kcals  Protein:  110-135 g  Fluid:  1.7 L   Betsey Finger MS, RDN, LDN, CNSC Registered Dietitian 3 Clinical Nutrition RD Inpatient Contact Info in Amion

## 2023-11-15 NOTE — Progress Notes (Signed)
 OT Cancellation Note  Patient Details Name: Kayla Schmitt MRN: [id number] DOB: 1965/05/30   Cancelled Treatment:    Reason Eval/Treat Not Completed: Medical issues which prohibited therapy. Pt with significant medical decline, now intubated. Will continue to follow.   Kennth Mliss Helling 11/15/2023, 9:11 AM Mliss HERO, OTR/L Acute Rehabilitation Services Office: [phone]

## 2023-11-15 NOTE — Inpatient Diabetes Management (Signed)
 Inpatient Diabetes Program Recommendations  AACE/ADA: New Consensus Statement on Inpatient Glycemic Control (2015)  Target Ranges:  Prepandial:   less than 140 mg/dL      Peak postprandial:   less than 180 mg/dL (1-2 hours)      Critically ill patients:  140 - 180 mg/dL   Lab Results  Component Value Date   GLUCAP 174 (H) 11/15/2023   HGBA1C 5.5 10/28/2023    Review of Glycemic Control  Latest Reference Range & Units 11/14/23 11:57 11/14/23 16:38 11/14/23 21:18 11/15/23 06:58 11/15/23 11:17  Glucose-Capillary 70 - 99 mg/dL 828 (H) 796 (H) 806 (H) 166 (H) 174 (H)   Diabetes history: DM 2 Outpatient Diabetes medications: Tresiba  16 units daily Current orders for Inpatient glycemic control:  Novolog  0-6 units tid with meals   Inpatient Diabetes Program Recommendations:    Note patient intubated.  Please increase Novolog  correction to q 4 hours.    Thanks,  Randall Bullocks, RN, BC-ADM Inpatient Diabetes Coordinator Pager [phone]  (8a-5p)

## 2023-11-15 NOTE — Progress Notes (Signed)
 NAME:  Kayla Schmitt, MRN:  [id number], DOB:  1965-08-02, LOS: 3 ADMISSION DATE:  11/12/2023, CONSULTATION DATE:  11/14/23 REFERRING MD:  Bensihmon, CHIEF COMPLAINT:  SOB   History of Present Illness:  58 year old woman with ischemic cardiomyopathy, aortic stenosis, recent nec fasc s/p BKA 6/25 who was in CIR then developed worsening SOB so admitted back to [facility].  Stubborn diuresis so attempted RHC today but unable to lie flat.  CXR showing pulmonary edema, bringing to ICU for inotropes and BIPAP.  Full ROS as below, understandably anxious.  Pertinent  Medical History  CAD Aortic stenosis Class 3 obesity Nec fasc Hypothyroidism PVD DM2 GERD Anemia  Significant Hospital Events: Including procedures, antibiotic start and stop dates in addition to other pertinent events   7/16 admission 7/17 ICU, intubated  Interim History / Subjective:  Patient started spiking fever with Tmax 101.5 Became tachycardic to 120s FiO2 titrated down to 45% with PEEP at 8 X-ray chest showing worsening bilateral infiltrates likely due to pulmonary edema  Objective    Blood pressure 92/60, pulse (!) 115, temperature (!) 101.5 F (38.6 C), resp. rate 15, height 5' 4 (1.626 m), weight 92.5 kg, SpO2 100%. CVP:  [11 mmHg-18 mmHg] 14 mmHg  Vent Mode: PRVC FiO2 (%):  [60 %-100 %] 60 % Set Rate:  [24 bmp-26 bmp] 26 bmp Vt Set:  [320 mL-400 mL] 320 mL PEEP:  [8 cmH20] 8 cmH20 Plateau Pressure:  [22 cmH20-27 cmH20] 22 cmH20   Intake/Output Summary (Last 24 hours) at 11/15/2023 1029 Last data filed at 11/15/2023 0700 Gross per 24 hour  Intake 1518.85 ml  Output 870 ml  Net 648.85 ml   Filed Weights   11/13/23 0448 11/14/23 0438 11/15/23 0500  Weight: 91.7 kg 91.7 kg 92.5 kg    Examination: General: Crtitically ill-appearing obese female, orally intubated HEENT: [place]/AT, eyes anicteric.  ETT and cortrak in place Neuro: Sedated, not following commands.  Eyes are closed.  Pupils 3 mm bilateral  reactive to light Chest: Bilateral crackles at bases, no wheezes or rhonchi Heart: Regular rate and rhythm, strong systolic murmur best heard in aortic area Abdomen: Soft, nondistended, bowel sounds present Extremities: Bilateral BKA  Labs and images reviewed  Patient Lines/Drains/Airways Status     Active Line/Drains/Airways     Name Placement date Placement time Site Days   PICC Single Lumen 10/26/23 Left Basilic 46 cm 1 cm 10/26/23  8788  Basilic  20   CVC Triple Lumen 11/14/23 Left Subclavian 11/14/23  1700  -- 1   NG/OG Vented/Dual Lumen 14 Fr. Oral External length of tube 68 cm 11/14/23  1700  Oral  1   Urethral Catheter Andie RN Temperature probe 14 Fr. 11/14/23  1700  Temperature probe  1   Airway 7.5 mm 11/14/23  1655  -- 1   Wound 10/22/23 1500 Surgical Open Surgical Incision Other (Comment) Left;Proximal 10/22/23  1500  Other (Comment)  24   Wound 10/30/23 Other (Comment) Foot Right;Anterior 10/30/23  --  Foot  16   Wound 10/29/23 1410 Other (Comment) Thigh Anterior;Left;Medial 10/29/23  1410  Thigh  17   Wound 10/29/23 1410 Other (Comment) Thigh Distal;Left;Posterior 10/29/23  1410  Thigh  17         Resolved problem list   Assessment and Plan  Acute on chronic HFpEF with cardiogenic shock  Acute respiratory failure with hypoxia and hypercapnia due to pulmonary edema and possible aspiration pneumonia Multivessel coronary artery disease with recent acute  NSTEMI Severe aortic stenosis Recent MRSA bacteremia and 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 Peripheral arterial disease Hypothyroidism AKI on CKD stage IIIa, likely due to cardiorenal syndrome Diabetes type 2, complicated with vasculopathy Hypertension  Appreciate advanced heart failure team follow-up Continue dobutamine  Continue Lasix , currently at 30 mg/h with metolazone Patient is on broad-spectrum antibiotics with daptomycin  and Unasyn  Follow-up cultures Monitor intake and  output GDMT as tolerated Patient was supposed to get CABG and aortic valve replacement but is on hold due to multiple active issues Continue aspirin  and statin Serum creatinine continue to trend up, closely monitor Avoid nephrotoxic agent Continue insulin  with CBG goal 140-180   Best Practice (right click and Reselect all SmartList Selections daily)   Diet/type: Start tube feeds DVT prophylaxis prophylactic heparin   Pressure ulcer(s): Please see nursing notes GI prophylaxis: PPI Lines: Central line Foley:  Yes, and it is still needed Code Status:  full code Last date of multidisciplinary goals of care discussion [7/18: updated mother at bedside]  Labs   CBC: Recent Labs  Lab 11/10/23 0305 11/11/23 0601 11/12/23 0405 11/13/23 0520 11/14/23 0502 11/14/23 1752 11/15/23 0455 11/15/23 0819  WBC 10.3 9.7 11.4* 14.0* 13.2*  --  10.3  --   NEUTROABS 6.8  --  7.7 10.2*  --   --   --   --   HGB 8.2* 7.7* 9.1* 9.2* 9.0* 8.5* 7.7* 8.2*  HCT 26.4* 24.7* 29.5* 28.8* 28.9* 25.0* 25.5* 24.0*  MCV 90.4 91.5 91.9 89.7 90.3  --  91.7  --   PLT 386 405* 496* 571* 510*  --  403*  --     Basic Metabolic Panel: Recent Labs  Lab 11/12/23 0405 11/13/23 0520 11/14/23 0502 11/14/23 1752 11/14/23 1833 11/15/23 0455 11/15/23 0819  NA 135 135 136 138 136 142 139  K 4.4 4.3 3.5 3.7 3.6 3.9 4.2  CL 100 98 98  --  100 103  --   CO2 23 24 23   --  24 24  --   GLUCOSE 181* 176* 178*  --  205* 169*  --   BUN 34* 37* 40*  --  45* 45*  --   CREATININE 1.28* 1.37* 1.56*  --  1.78* 2.18*  --   CALCIUM  8.7* 9.0 8.8*  --  8.6* 8.4*  --   MG  --   --  1.8  --   --  1.7  --    GFR: Estimated Creatinine Clearance: 31.8 mL/min (A) (by C-G formula based on SCr of 2.18 mg/dL (H)). Recent Labs  Lab 11/12/23 0404 11/12/23 0405 11/12/23 2133 11/13/23 0520 11/14/23 0502 11/14/23 1833 11/15/23 0455  PROCALCITON 0.27  --   --   --   --  1.36  --   WBC  --  11.4*  --  14.0* 13.2*  --  10.3   LATICACIDVEN  --   --  1.3  --   --  1.0  --     Liver Function Tests: Recent Labs  Lab 11/12/23 1921 11/14/23 1833  AST 24 16  ALT 19 16  ALKPHOS 115 120  BILITOT 0.9 0.7  PROT 8.0 7.2  ALBUMIN  2.2* 2.2*   No results for input(s): LIPASE, AMYLASE in the last 168 hours. No results for input(s): AMMONIA in the last 168 hours.  ABG    Component Value Date/Time   PHART 7.343 (L) 11/15/2023 0819   PCO2ART 45.2 11/15/2023 0819   PO2ART 117 (H) 11/15/2023  0819   HCO3 24.2 11/15/2023 0819   TCO2 25 11/15/2023 0819   ACIDBASEDEF 1.0 11/15/2023 0819   O2SAT 98 11/15/2023 0819     Coagulation Profile: Recent Labs  Lab 11/12/23 1921  INR 1.3*    Cardiac Enzymes: Recent Labs  Lab 11/11/23 0601  CKTOTAL 106    HbA1C: Hgb A1c MFr Bld  Date/Time Value Ref Range Status  10/28/2023 07:40 PM 5.5 4.8 - 5.6 % Final    Comment:    (NOTE) Diagnosis of Diabetes The following HbA1c ranges recommended by the American Diabetes Association (ADA) may be used as an aid in the diagnosis of diabetes mellitus.  Hemoglobin             Suggested A1C NGSP%              Diagnosis  <5.7                   Non Diabetic  5.7-6.4                Pre-Diabetic  >6.4                   Diabetic  <7.0                   Glycemic control for                       adults with diabetes.    08/02/2022 08:48 PM 6.4 (H) 4.8 - 5.6 % Final    Comment:    (NOTE)         Prediabetes: 5.7 - 6.4         Diabetes: >6.4         Glycemic control for adults with diabetes: <7.0     CBG: Recent Labs  Lab 11/14/23 0619 11/14/23 1157 11/14/23 1638 11/14/23 2118 11/15/23 0658  GLUCAP 182* 171* 203* 193* 166*    The patient is critically ill due to acute respiratory failure with hypoxia and hypercapnia/acute on chronic HFpEF with cardiogenic shock.  Critical care was necessary to treat or prevent imminent or life-threatening deterioration.  Critical care was time spent personally by me on  the following activities: development of treatment plan with patient and/or surrogate as well as nursing, discussions with consultants, evaluation of patient's response to treatment, examination of patient, obtaining history from patient or surrogate, ordering and performing treatments and interventions, ordering and review of laboratory studies, ordering and review of radiographic studies, pulse oximetry, re-evaluation of patient's condition and participation in multidisciplinary rounds.   During this encounter critical care time was devoted to patient care services described in this note for 44 minutes.     Valinda Novas, MD [facility] See Amion for pager If no response to pager, please call [phone] until 7pm After 7pm, Please call E-link [phone]

## 2023-11-15 NOTE — Progress Notes (Signed)
 Unable to successfully start an arterial line. Pressure was held over site, no hematoma.

## 2023-11-16 ENCOUNTER — Other Ambulatory Visit: Payer: Self-pay

## 2023-11-16 ENCOUNTER — Inpatient Hospital Stay (HOSPITAL_COMMUNITY)

## 2023-11-16 DIAGNOSIS — E871 Hypo-osmolality and hyponatremia: Secondary | ICD-10-CM

## 2023-11-16 DIAGNOSIS — R570 Cardiogenic shock: Secondary | ICD-10-CM | POA: Insufficient documentation

## 2023-11-16 LAB — RENAL FUNCTION PANEL
Albumin: 1.8 g/dL — ABNORMAL LOW (ref 3.5–5.0)
Anion gap: 16 — ABNORMAL HIGH (ref 5–15)
BUN: 51 mg/dL — ABNORMAL HIGH (ref 6–20)
CO2: 21 mmol/L — ABNORMAL LOW (ref 22–32)
Calcium: 8.1 mg/dL — ABNORMAL LOW (ref 8.9–10.3)
Chloride: 98 mmol/L (ref 98–111)
Creatinine, Ser: 2.88 mg/dL — ABNORMAL HIGH (ref 0.44–1.00)
GFR, Estimated: 19 mL/min — ABNORMAL LOW (ref >60)
Glucose, Bld: 213 mg/dL — ABNORMAL HIGH (ref 70–99)
Phosphorus: 5.7 mg/dL — ABNORMAL HIGH (ref 2.5–4.6)
Potassium: 4.2 mmol/L (ref 3.5–5.1)
Sodium: 135 mmol/L (ref 135–145)

## 2023-11-16 LAB — GLUCOSE, CAPILLARY
Glucose-Capillary: 248 mg/dL — ABNORMAL HIGH (ref 70–99)
Glucose-Capillary: 236 mg/dL — ABNORMAL HIGH (ref 70–99)
Glucose-Capillary: 231 mg/dL — ABNORMAL HIGH (ref 70–99)
Glucose-Capillary: 204 mg/dL — ABNORMAL HIGH (ref 70–99)
Glucose-Capillary: 174 mg/dL — ABNORMAL HIGH (ref 70–99)
Glucose-Capillary: 164 mg/dL — ABNORMAL HIGH (ref 70–99)
Glucose-Capillary: 156 mg/dL — ABNORMAL HIGH (ref 70–99)

## 2023-11-16 LAB — BASIC METABOLIC PANEL WITH GFR
Anion gap: 14 (ref 5–15)
BUN: 53 mg/dL — ABNORMAL HIGH (ref 6–20)
CO2: 22 mmol/L (ref 22–32)
Calcium: 8.3 mg/dL — ABNORMAL LOW (ref 8.9–10.3)
Chloride: 98 mmol/L (ref 98–111)
Creatinine, Ser: 2.97 mg/dL — ABNORMAL HIGH (ref 0.44–1.00)
GFR, Estimated: 18 mL/min — ABNORMAL LOW (ref >60)
Glucose, Bld: 302 mg/dL — ABNORMAL HIGH (ref 70–99)
Potassium: 4.3 mmol/L (ref 3.5–5.1)
Sodium: 134 mmol/L — ABNORMAL LOW (ref 135–145)

## 2023-11-16 LAB — TRIGLYCERIDES: Triglycerides: 212 mg/dL — ABNORMAL HIGH (ref <150)

## 2023-11-16 LAB — CBC WITH DIFFERENTIAL/PLATELET
Abs Immature Granulocytes: 0.2 K/uL — ABNORMAL HIGH (ref 0.00–0.07)
Basophils Absolute: 0.1 K/uL (ref 0.0–0.1)
Basophils Relative: 0 %
Eosinophils Absolute: 0.3 K/uL (ref 0.0–0.5)
Eosinophils Relative: 2 %
HCT: 25 % — ABNORMAL LOW (ref 36.0–46.0)
Hemoglobin: 7.6 g/dL — ABNORMAL LOW (ref 12.0–15.0)
Immature Granulocytes: 1 %
Lymphocytes Relative: 9 %
Lymphs Abs: 1.3 K/uL (ref 0.7–4.0)
MCH: 28.6 pg (ref 26.0–34.0)
MCHC: 30.4 g/dL (ref 30.0–36.0)
MCV: 94 fL (ref 80.0–100.0)
Monocytes Absolute: 1.6 K/uL — ABNORMAL HIGH (ref 0.1–1.0)
Monocytes Relative: 11 %
Neutro Abs: 11.3 K/uL — ABNORMAL HIGH (ref 1.7–7.7)
Neutrophils Relative %: 77 %
Platelets: 536 K/uL — ABNORMAL HIGH (ref 150–400)
RBC: 2.66 MIL/uL — ABNORMAL LOW (ref 3.87–5.11)
RDW: 16.9 % — ABNORMAL HIGH (ref 11.5–15.5)
WBC: 14.8 K/uL — ABNORMAL HIGH (ref 4.0–10.5)
nRBC: 0 % (ref 0.0–0.2)

## 2023-11-16 LAB — MAGNESIUM: Magnesium: 2.6 mg/dL — ABNORMAL HIGH (ref 1.7–2.4)

## 2023-11-16 LAB — COOXEMETRY PANEL
Carboxyhemoglobin: 2.2 % — ABNORMAL HIGH (ref 0.5–1.5)
Methemoglobin: <0.7 % (ref 0.0–1.5)
O2 Saturation: 66.8 %
Total hemoglobin: 7.4 g/dL — ABNORMAL LOW (ref 12.0–16.0)

## 2023-11-16 LAB — PHOSPHORUS: Phosphorus: 6 mg/dL — ABNORMAL HIGH (ref 2.5–4.6)

## 2023-11-16 MED ORDER — SODIUM CHLORIDE 0.9 % IV SOLN
500.0000 [IU]/h | INTRAVENOUS | Status: DC
  Administered 2023-11-16: 500 [IU]/h via INTRAVENOUS_CENTRAL
  Administered 2023-11-17: 750 [IU]/h via INTRAVENOUS_CENTRAL
  Administered 2023-11-17: 500 [IU]/h via INTRAVENOUS_CENTRAL
  Administered 2023-11-18 (×2): 750 [IU]/h via INTRAVENOUS_CENTRAL
  Filled 2023-11-16: qty 2
  Filled 2023-11-16: qty 10000
  Filled 2023-11-16 (×3): qty 2

## 2023-11-16 MED ORDER — PRISMASOL BGK 4/2.5 32-4-2.5 MEQ/L EC SOLN: Status: DC

## 2023-11-16 MED ORDER — RENA-VITE PO TABS
1.0000 | ORAL_TABLET | Freq: Every day | ORAL | Status: DC
  Administered 2023-11-16 – 2023-11-18 (×3): 1
  Filled 2023-11-16 (×3): qty 1

## 2023-11-16 MED ORDER — METOLAZONE 5 MG PO TABS
10.0000 mg | ORAL_TABLET | Freq: Once | ORAL | Status: AC
  Administered 2023-11-16: 10 mg
  Filled 2023-11-16: qty 2

## 2023-11-16 MED ORDER — JUVEN PO PACK
1.0000 | PACK | Freq: Two times a day (BID) | ORAL | Status: DC
  Administered 2023-11-17: 1
  Filled 2023-11-16: qty 1

## 2023-11-16 MED ORDER — HEPARIN SODIUM (PORCINE) 1000 UNIT/ML DIALYSIS
1000.0000 [IU] | INTRAMUSCULAR | Status: DC | PRN
  Administered 2023-11-19: 2400 [IU] via INTRAVENOUS_CENTRAL
  Filled 2023-11-16 (×2): qty 6

## 2023-11-16 MED ORDER — VITAL AF 1.2 CAL PO LIQD
1000.0000 mL | ORAL | Status: DC
  Administered 2023-11-16: 1000 mL

## 2023-11-16 MED ORDER — LINEZOLID 600 MG PO TABS
600.0000 mg | ORAL_TABLET | Freq: Two times a day (BID) | ORAL | Status: DC
  Administered 2023-11-16 – 2023-11-17 (×4): 600 mg
  Filled 2023-11-16 (×4): qty 1

## 2023-11-16 MED ORDER — INSULIN ASPART 100 UNIT/ML IJ SOLN
0.0000 [IU] | INTRAMUSCULAR | Status: DC
  Administered 2023-11-16 (×2): 5 [IU] via SUBCUTANEOUS
  Administered 2023-11-16 – 2023-11-17 (×7): 3 [IU] via SUBCUTANEOUS
  Administered 2023-11-17: 5 [IU] via SUBCUTANEOUS
  Administered 2023-11-18 (×4): 2 [IU] via SUBCUTANEOUS
  Administered 2023-11-18: 3 [IU] via SUBCUTANEOUS
  Administered 2023-11-19 – 2023-11-20 (×4): 2 [IU] via SUBCUTANEOUS
  Administered 2023-11-20: 3 [IU] via SUBCUTANEOUS
  Administered 2023-11-20 (×2): 2 [IU] via SUBCUTANEOUS
  Administered 2023-11-20: 3 [IU] via SUBCUTANEOUS
  Administered 2023-11-21: 2 [IU] via SUBCUTANEOUS
  Administered 2023-11-21 (×2): 3 [IU] via SUBCUTANEOUS
  Administered 2023-11-21: 2 [IU] via SUBCUTANEOUS
  Administered 2023-11-21: 3 [IU] via SUBCUTANEOUS
  Administered 2023-11-21 – 2023-11-22 (×2): 2 [IU] via SUBCUTANEOUS
  Administered 2023-11-22: 3 [IU] via SUBCUTANEOUS

## 2023-11-16 MED ORDER — INSULIN GLARGINE-YFGN 100 UNIT/ML ~~LOC~~ SOLN
6.0000 [IU] | Freq: Two times a day (BID) | SUBCUTANEOUS | Status: DC
  Administered 2023-11-16 – 2023-12-05 (×36): 6 [IU] via SUBCUTANEOUS
  Filled 2023-11-16 (×43): qty 0.06

## 2023-11-16 NOTE — Procedures (Signed)
 Arterial Catheter Insertion Procedure Note  Kayla Schmitt  [id number]  1965/08/19  Date:11/16/23  Time:12:06 PM    Provider Performing: Valinda Novas    Procedure: Insertion of Arterial Line (63379) with US  guidance (23062)   Indication(s) Blood pressure monitoring and/or need for frequent ABGs  Consent Risks of the procedure as well as the alternatives and risks of each were explained to the patient and/or caregiver.  Consent for the procedure was obtained and is signed in the bedside chart  Anesthesia None   Time Out Verified patient identification, verified procedure, site/side was marked, verified correct patient position, special equipment/implants available, medications/allergies/relevant history reviewed, required imaging and test results available.   Sterile Technique Maximal sterile technique including full sterile barrier drape, hand hygiene, sterile gown, sterile gloves, mask, hair covering, sterile ultrasound probe cover (if used).   Procedure Description Area of catheter insertion was cleaned with chlorhexidine  and draped in sterile fashion. With real-time ultrasound guidance an arterial catheter was placed into the right Axillary artery.  Appropriate arterial tracings confirmed on monitor.     Complications/Tolerance None; patient tolerated the procedure well.   EBL Minimal   Specimen(s) None

## 2023-11-16 NOTE — Evaluation (Signed)
 Physical Therapy Evaluation Patient Details Name: Kayla Schmitt MRN: [id number] DOB: 1965-08-04 Today's Date: 11/16/2023  History of Present Illness  58 y.o female admitted 7/15 from [facility] for SOB, CHF exacerbation. 7/17 attempted RHC but couldn't lie flat with pulmonary edema and decompensation requiring intubation. PMHx: admission 6/22-7/1 for LLE pain, sepsis, necrotizing fasciitis. 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 CAD, aortic stenosis, HTN, CHF, PAD, bil transmet amputation, HLD, hypothyroidsim, T2DM  Clinical Impression  PT intubated with sedation weaned and able to actively participate in therapy with pt gesturing to communicate. Pt with lethargy compared to prior sessions, decreased strength and activity tolerance which may all be related to medication. Pt able to tolerate EOB, scooting and return to supine today limited by fatigue. Pt with significant change in status from admission and will benefit from acute therapy to maximize mobility, balance, safety and function to decrease burden of care. Pt had completed [facility] rehab but given current complexity and decline would benefit from intensive inpatient follow-up therapy, >3 hours/day. Encouraged LB HEP and assisting nursing with bed mobility as able, family not present at this time.  Vent on CPAP/PS 40% FIO2, peep 8, SPO2 95% 92/61 (71) HR 115         If plan is discharge home, recommend the following: A lot of help with walking and/or transfers;A lot of help with bathing/dressing/bathroom;Assistance with cooking/housework;Assist for transportation;Help with stairs or ramp for entrance   Can travel by private vehicle        Equipment Recommendations None recommended by PT  Recommendations for Other Services  OT consult;Rehab consult    Functional Status Assessment Patient has had a recent decline in their functional status and demonstrates the ability to make significant improvements in function in a reasonable and predictable amount of time.      Precautions / Restrictions Precautions Precautions: Fall Recall of Precautions/Restrictions: Impaired Precaution/Restrictions Comments: R transmet amputation, Lt TKA, ETT, vent      Mobility  Bed Mobility Overal bed mobility: Needs Assistance Bed Mobility: Supine to Sit, Sit to Supine     Supine to sit: HOB elevated, Used rails, Mod assist, +2 for safety/equipment Sit to supine: Mod assist, +2 for physical assistance   General bed mobility comments: pt with semi roll to left to grasp rail with mod +2 assist to fully pivot to rise to side with +2 for lines and safety. return to bed with assist to lift legs to surface and position in supine. max +2 to slide toward HOB. Scooting along EOB with min-mod assist with increased assist with prolonged sitting due to fatigue. EOB grossly 5 min    Transfers                   General transfer comment: deferred due to VDRF, lethargy    Ambulation/Gait                  Stairs            Wheelchair Mobility     Tilt Bed    Modified Rankin (Stroke Patients Only)       Balance Overall balance assessment: Needs assistance Sitting-balance support: Feet unsupported, No upper extremity supported Sitting balance-Leahy Scale: Fair Sitting balance - Comments: EOB with CGA, posterior bias at times with fatigue with cues for midline  Pertinent Vitals/Pain Pain Assessment Pain Assessment: No/denies pain    Home Living Family/patient expects to be discharged to:: Private residence Living Arrangements: Alone Available Help at Discharge: Family;Available 24 hours/day Type of Home: House Home Access: Ramped entrance       Home Layout: One level Home Equipment: Wheelchair - Forensic psychologist (2 wheels);Other (comment);Shower seat;BSC/3in1;Rollator (4 wheels)      Prior Function Prior Level of Function : Independent/Modified Independent              Mobility Comments: Prior to BKA pt was working 5 days a week using cane, driving. At [facility] pt supervision CGA for lateral scoot transfers and WC propulsion ADLs Comments: CGA- supervision for ADLs     Extremity/Trunk Assessment   Upper Extremity Assessment Upper Extremity Assessment: Generalized weakness    Lower Extremity Assessment Lower Extremity Assessment: Generalized weakness RLE Deficits / Details: R transmet amputation, hip, knee and ankle ROM WFL LLE Deficits / Details: Lt BKA  grossly functional ROM did not formally assess    Cervical / Trunk Assessment Cervical / Trunk Assessment: Normal  Communication   Communication Communication: Impaired Factors Affecting Communication: Trach/intubated    Cognition Arousal: Lethargic, Suspect due to medications Behavior During Therapy: Flat affect   PT - Cognitive impairments: Difficult to assess Difficult to assess due to: Intubated                       Following commands: Impaired Following commands impaired: Follows one step commands with increased time     Cueing Cueing Techniques: Verbal cues, Gestural cues     General Comments      Exercises     Assessment/Plan    PT Assessment Patient needs continued PT services  PT Problem List Decreased strength;Decreased activity tolerance;Decreased balance;Decreased mobility;Cardiopulmonary status limiting activity;Decreased skin integrity;Pain;Decreased cognition       PT Treatment Interventions DME instruction;Functional mobility training;Therapeutic activities;Therapeutic exercise;Balance training;Cognitive remediation;Patient/family education;Wheelchair mobility training    PT Goals (Current goals can be found in the Care Plan section)  Acute Rehab PT Goals PT Goal Formulation: Patient unable to participate in goal setting Time For Goal Achievement: 11/30/23 Potential to Achieve Goals: Fair    Frequency Min 2X/week     Co-evaluation                AM-PAC PT 6 Clicks Mobility  Outcome Measure Help needed turning from your back to your side while in a flat bed without using bedrails?: A Lot Help needed moving from lying on your back to sitting on the side of a flat bed without using bedrails?: Total Help needed moving to and from a bed to a chair (including a wheelchair)?: Total Help needed standing up from a chair using your arms (e.g., wheelchair or bedside chair)?: Total Help needed to walk in hospital room?: Total Help needed climbing 3-5 steps with a railing? : Total 6 Click Score: 7    End of Session   Activity Tolerance: Patient tolerated treatment well Patient left: in bed;with call bell/phone within reach;with nursing/sitter in room Nurse Communication: Mobility status;Need for lift equipment PT Visit Diagnosis: Unsteadiness on feet (R26.81);Other abnormalities of gait and mobility (R26.89);Muscle weakness (generalized) (M62.81);Difficulty in walking, not elsewhere classified (R26.2)    Time: 9056-9043 PT Time Calculation (min) (ACUTE ONLY): 13 min   Charges:   PT Evaluation $PT Eval High Complexity: 1 High   PT General Charges $$ ACUTE PT VISIT: 1 Visit  Lenoard SQUIBB, PT Acute Rehabilitation Services Office: [phone]   Lenoard KATHEE Docker 11/16/2023, 12:05 PM

## 2023-11-16 NOTE — Progress Notes (Addendum)
 Nutrition Follow Up Note   DOCUMENTATION CODES:   Not applicable  INTERVENTION:   Vital 1.2@65ml /hr- initiate at 10ml/hr and increase by 10ml/hr q6 hours until goal rate is reached.   ProSource TF 20- Give 60ml daily via tube, each supplement provides 80kcal and 20g of protein.   Free water flushes 30ml q4 hours to maintain tube patency   Regimen provides 1952kcal/day, 137g/day of protein and 1429ml/day of free water.   Rena-vite daily via tube  Juven Fruit Punch BID via tube, each serving provides 95kcal and 2.5g of protein (amino acids glutamine and arginine)  Pt at high refeed risk; recommend monitor potassium, magnesium  and phosphorus labs daily until stable  Daily weights  NUTRITION DIAGNOSIS:   Inadequate oral intake related to acute illness as evidenced by NPO status. -ongoing   GOAL:   Patient will meet greater than or equal to 90% of their needs -met   MONITOR:   Vent status, Labs, Weight trends, TF tolerance, Skin  ASSESSMENT:   58 yo female admitted with acute on chronic diastolic HF, cardigenic shock, AKI, moderate MR, moderate to severe aortic stenosis, MRSA bacteremia and acute respiratory failure requiring ventilation. 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  RD working remotely.  Tube feeds held this morning for a large volume emesis while RN giving medications. Pt was receiving tube feeds at 41ml/hr prior to emesis. Tube feeds have now been restarted at trickle rate of 19ml/hr but ok to advance slowly per MD. CRRT initiated today for volume overload. Per chart, pt appears weight stable since admission. Pt +5.0L on her I & Os.    Medications reviewed and include: aspirin , colace, heparin , insulin , synthroid , zyvox , protonix , miralax , senokot, dobutamine , heparin , levophed , zosyn , vasopressin    Labs reviewed: Na 135 wnl, K 4.2 wnl, BUN 51(H), creat 2.88(H), P 5.7(H), Mg  2.6(H) BNP- 1626.5(H)- 7/16 Wbc- 14.8(H), Hgb 7.6(L), Hct 25.0(L)  Patient is currently intubated on ventilator support MV: 12.6 L/min Temp (24hrs), Avg:100 F (37.8 C), Min:97.5 F (36.4 C), Max:101.3 F (38.5 C)  Propofol : 8.33 ml/hr- provides 219kcal/day   MAP >66mmHg   UOP-   Diet Order:   Diet Order             Diet NPO time specified  Diet effective now                  EDUCATION NEEDS:   Not appropriate for education at this time  Skin:  Skin Integrity Issues:: Incisions Incisions: Recent L BKA, stitches still in place  Last BM:  7/19- via rectal tube  Height:   Ht Readings from Last 1 Encounters:  11/12/23 5' 4 (1.626 m)    Weight:   Wt Readings from Last 1 Encounters:  11/16/23 92.5 kg    BMI:  Body mass index is 35 kg/m.  Estimated Nutritional Needs:   Kcal:  1900-2200kcal/day  Protein:  120-135g/day  Fluid:  1.4-1.6L/day  Augustin Shams MS, RD, LDN If unable to be reached, please send secure chat to RD inpatient available from 8:00a-4:00p daily

## 2023-11-16 NOTE — Procedures (Signed)
 Central Venous Catheter Insertion Procedure Note  Kayla Schmitt  [id number]  09-13-1965  Date:11/16/23  Time:12:05 PM   Provider Performing:Avrianna Smart   Procedure: Insertion of Non-tunneled Central Venous [phone]) with US  guidance (23062)   Indication(s) Hemodialysis  Consent Risks of the procedure as well as the alternatives and risks of each were explained to the patient and/or caregiver.  Consent for the procedure was obtained and is signed in the bedside chart  Anesthesia Topical only with 1% lidocaine    Timeout Verified patient identification, verified procedure, site/side was marked, verified correct patient position, special equipment/implants available, medications/allergies/relevant history reviewed, required imaging and test results available.  Sterile Technique Maximal sterile technique including full sterile barrier drape, hand hygiene, sterile gown, sterile gloves, mask, hair covering, sterile ultrasound probe cover (if used).  Procedure Description Area of catheter insertion was cleaned with chlorhexidine  and draped in sterile fashion.  With real-time ultrasound guidance a HD catheter was placed into the right internal jugular vein. Nonpulsatile blood flow and easy flushing noted in all ports.  The catheter was sutured in place and sterile dressing applied.  Complications/Tolerance None; patient tolerated the procedure well. Chest X-ray is ordered to verify placement for internal jugular or subclavian cannulation.   Chest x-ray is not ordered for femoral cannulation.  EBL Minimal  Specimen(s) None

## 2023-11-16 NOTE — Progress Notes (Signed)
 Advanced Heart Failure Rounding Note  Cardiologist: Vishnu SHAUNNA Maywood, MD  Chief Complaint: Acute on chronic HFpEF / RV Failure Subjective:    On DBA 5 Anuric essentially despite high dose lasix , inotrope and vasopressor support. Plan for CRRT today.  Fever curve improving with IV antibiotics, transition to linezolid .   Objective:    Weight Range: 92.5 kg Body mass index is 35 kg/m.   Vital Signs:   Temp:  [97.5 F (36.4 C)-101.3 F (38.5 C)] 99 F (37.2 C) (07/19 1100) Pulse Rate:  [104-138] 117 (07/19 1100) Resp:  [11-35] 19 (07/19 1100) BP: (65-123)/(42-110) 96/68 (07/19 1100) SpO2:  [89 %-100 %] 94 % (07/19 1100) FiO2 (%):  [40 %-60 %] 40 % (07/19 0818) Weight:  [92.5 kg] 92.5 kg (07/19 0701) Last BM Date : 11/16/23  Weight change: Filed Weights   11/14/23 0438 11/15/23 0500 11/16/23 0701  Weight: 91.7 kg 92.5 kg 92.5 kg   Intake/Output:  Intake/Output Summary (Last 24 hours) at 11/16/2023 1206 Last data filed at 11/16/2023 1116 Gross per 24 hour  Intake 3559.39 ml  Output 497 ml  Net 3062.39 ml    Physical Exam    CVP 12 General: Chronically -ill appearing. On vent. Cardiac:  Soft, harsh, late peaking systolic murmur, tachycardic Extremities: Warm  Prior bilateral BKA,  Neuro: Sedated  Telemetry   ST 110s (personally reviewed)  Labs    CBC Recent Labs    11/15/23 1606 11/16/23 0129  WBC 15.3* 14.8*  NEUTROABS 12.2* 11.3*  HGB 7.8* 7.6*  HCT 25.9* 25.0*  MCV 92.8 94.0  PLT 531* 536*   Basic Metabolic Panel Recent Labs    92/81/74 0455 11/15/23 0819 11/15/23 1413 11/16/23 0129  NA 142   < > 137 134*  K 3.9   < > 4.6 4.3  CL 103  --  102 98  CO2 24  --  22 22  GLUCOSE 169*  --  221* 302*  BUN 45*  --  48* 53*  CREATININE 2.18*  --  2.54* 2.97*  CALCIUM  8.4*  --  8.5* 8.3*  MG 1.7  --   --  2.6*  PHOS  --   --   --  6.0*   < > = values in this interval not displayed.   Liver Function Tests Recent Labs    11/14/23 1833   AST 16  ALT 16  ALKPHOS 120  BILITOT 0.7  PROT 7.2  ALBUMIN  2.2*   BNP (last 3 results) Recent Labs    11/03/23 0112 11/07/23 1457 11/13/23 0521  BNP 793.8* 336.4* 1,626.5*   Medications:    Scheduled Medications:  aspirin   81 mg Per Tube Daily   atorvastatin   40 mg Per Tube Daily   Chlorhexidine  Gluconate Cloth  6 each Topical Daily   docusate  100 mg Per Tube BID   feeding supplement (PROSource TF20)  60 mL Per Tube Daily   gabapentin   300 mg Per Tube QHS   heparin  injection (subcutaneous)  5,000 Units Subcutaneous Q8H   insulin  aspart  0-15 Units Subcutaneous Q4H   insulin  glargine-yfgn  6 Units Subcutaneous BID   ipratropium-albuterol   3 mL Nebulization Q6H   levothyroxine   150 mcg Per Tube QAC breakfast   linezolid   600 mg Per Tube Q12H   mouth rinse  15 mL Mouth Rinse Q2H   pantoprazole  (PROTONIX ) IV  40 mg Intravenous QHS   polyethylene glycol  17 g Per Tube BID   senna-docusate  1 tablet Per Tube BID   sodium chloride  flush  10-40 mL Intracatheter Q12H   Infusions:  sodium chloride      DOBUTamine  5 mcg/kg/min (11/16/23 1100)   feeding supplement (VITAL AF 1.2 CAL) Stopped (11/16/23 0959)   furosemide  (LASIX ) 200 mg in dextrose  5 % 100 mL (2 mg/mL) infusion 30 mg/hr (11/16/23 1100)   heparin  10,000 units/ 20 mL infusion syringe     norepinephrine  (LEVOPHED ) Adult infusion 4 mcg/min (11/16/23 1100)   piperacillin -tazobactam (ZOSYN )  IV 12.5 mL/hr at 11/16/23 1100   prismasol  BGK 4/2.5     prismasol  BGK 4/2.5     prismasol  BGK 4/2.5     propofol  (DIPRIVAN ) infusion 25 mcg/kg/min (11/16/23 1117)   vasopressin  0.03 Units/min (11/16/23 1100)   PRN Medications: Place/Maintain arterial line **AND** sodium chloride , acetaminophen , fentaNYL , fentaNYL  (SUBLIMAZE ) injection, heparin , methocarbamol , mouth rinse, oxyCODONE -acetaminophen , prochlorperazine , sodium chloride  flush  Patient Profile   58 y.o. female with history of PAD with prior SFA stents and bilateral  TMA, severe 3v CAD, MR/AS, DM II, anemia, HTN, HLD, hypothyroidism.    Admitted in 06/25 with MRSA bactermia and necrotizing fasciitis s/p left BKA. Transferred back to [facility] from rehab d/t acute on chronic CHF with concern for low-output and acute respiratory failure.  Assessment/Plan   Acute on chronic HFpEF>Cardiogenic/mixed Shock - TTE 12/24: EF 40-45% - TTE 6/25: EF 50-55% - TEE 6/25: EF 60-65%, RV okay, mild to moderate MR, moderate to severe AS with mean gradient 27 mmHg and AVA 1.07 cm2 - Taken for RHC 7/17, however developed flash pulmonary edema. Unable to lie flat for procedure.  - Would consider repeating on 7/20 if respiratory status not improved - Volume overloaded significantly on exam, CRRT given anuria - Stop lasix  and metolazone  today - Echo images are incredibly difficult to interpret. CT or invasive hemodynamics may eventually be helpful for determining gradient, degree of calcification - However, she is not a TAVR or SAVR candidate currently so even if she has severe AS it would not change current management   2. Acute respiratory failure with hypoxia - CXR with b/l diffuse airspace opacities, likely pulmonary edema, though given fevers also concern for PNA - Now intubated, improving vent setting - CRRT    3. CAD - NSTEMI in 4/24. Multivessel CAD. CABG had been recommended but managed medically d/t other active medical issues - continue aspirin  + statin   4. Valvular heart disease - Mild to moderate MR and moderate to severe AS on echo 6/25 - Repeat echo with extremely poor windows, based on exam expect AS moderate-severe   5. MRSA bacteremia Necrotizing fasciitis - S/p recent left BKA 6/25; S/p TMA in 4/24 - Febrile curve improving - On linezolid  + zosyn    6. PAD - hx SFA stents - On aspirin  + statin. Had been on plavix  but stopped d/t risk of bleeding   7. Anemia - s/p 1 u RBC 7/14 - Hx GI bleed from AVMs, ? D/t aortic valve stenosis - hgb 7.6 today,  stable.   8. AKI on CKD IIIa - In setting of volume overload/low-output - Cr up to 2.18 today, repeat BMP this afternoon - continue DBA and Lasix  gtt  Length of Stay: 4  CRITICAL CARE Performed by: Morene JINNY Brownie   Total critical care time: 35 minutes  Critical care time was exclusive of separately billable procedures and treating other patients.  Critical care was necessary to treat or prevent imminent or life-threatening deterioration.  Critical care was time  spent personally by me on the following activities: development of treatment plan with patient and/or surrogate as well as nursing, discussions with consultants, evaluation of patient's response to treatment, examination of patient, obtaining history from patient or surrogate, ordering and performing treatments and interventions, ordering and review of laboratory studies, ordering and review of radiographic studies, pulse oximetry and re-evaluation of patient's condition.   Morene JINNY Brownie, MD  11/16/2023, 12:06 PM  Advanced Heart Failure Team Pager [phone] (M-F; 7a - 5p)  Please contact [facility] for night-coverage after hours (5p -7a ) and weekends on [email or web address]

## 2023-11-16 NOTE — Progress Notes (Signed)
 NAME:  Kayla Schmitt, MRN:  [id number], DOB:  June 07, 1965, LOS: 4 ADMISSION DATE:  11/12/2023, CONSULTATION DATE:  11/14/23 REFERRING MD:  Bensihmon, CHIEF COMPLAINT:  SOB   History of Present Illness:  58 year old woman with ischemic cardiomyopathy, aortic stenosis, recent nec fasc s/p BKA 6/25 who was in CIR then developed worsening SOB so admitted back to [facility].  Stubborn diuresis so attempted RHC today but unable to lie flat.  CXR showing pulmonary edema, bringing to ICU for inotropes and BIPAP.  Full ROS as below, understandably anxious.  Pertinent  Medical History  CAD Aortic stenosis Class 3 obesity Nec fasc Hypothyroidism PVD DM2 GERD Anemia  Significant Hospital Events: Including procedures, antibiotic start and stop dates in addition to other pertinent events   7/16 admission 7/17 ICU, intubated 7/18 started spiking fever with Tmax 101.5, tachycardic.  X-ray chest still showing bilateral infiltrate but FiO2 is coming down.  Lasix  infusion increased to 30 mg/h  Interim History / Subjective:  Patient became afebrile Not making much urine despite high-dose Lasix  infusion Vasopressor requirement is improving Tolerating spontaneous breathing trial  Objective    Blood pressure 112/71, pulse (!) 107, temperature (!) 97.5 F (36.4 C), resp. rate 20, height 5' 4 (1.626 m), weight 92.5 kg, SpO2 100%. CVP:  [7 mmHg-34 mmHg] 13 mmHg  Vent Mode: CPAP;PSV FiO2 (%):  [40 %-60 %] 40 % Set Rate:  [26 bmp] 26 bmp Vt Set:  [430 mL] 430 mL PEEP:  [8 cmH20] 8 cmH20 Pressure Support:  [8 cmH20] 8 cmH20 Plateau Pressure:  [21 cmH20-27 cmH20] 26 cmH20   Intake/Output Summary (Last 24 hours) at 11/16/2023 0910 Last data filed at 11/16/2023 0853 Gross per 24 hour  Intake 3435.37 ml  Output 510 ml  Net 2925.37 ml   Filed Weights   11/13/23 0448 11/14/23 0438 11/15/23 0500  Weight: 91.7 kg 91.7 kg 92.5 kg    Examination: General: Crtitically ill-appearing obese female,  orally intubated HEENT: [place]/AT, eyes anicteric.  ETT and cortrak in place Neuro: Opens eyes with vocal stimuli, nodding for questions Chest: Bilateral faint basal crackles, no wheezes or rhonchi Heart: Tachycardic, regular rhythm, strong systolic murmur best heard in aortic area  Abdomen: Soft, nondistended, bowel sounds present Extremities: Bilateral BKA   Labs and images reviewed  Patient Lines/Drains/Airways Status     Active Line/Drains/Airways     Name Placement date Placement time Site Days   PICC Single Lumen 10/26/23 Left Basilic 46 cm 1 cm 10/26/23  8788  Basilic  21   CVC Triple Lumen 11/14/23 Left Subclavian 11/14/23  1700  -- 2   NG/OG Vented/Dual Lumen 14 Fr. Oral External length of tube 68 cm 11/14/23  1700  Oral  2   Urethral Catheter Andie RN Temperature probe 14 Fr. 11/14/23  1700  Temperature probe  2   Fecal Management System 38 mL 11/16/23  0454  -- less than 1   Airway 7.5 mm 11/14/23  1655  -- 2   Wound 10/22/23 1500 Surgical Open Surgical Incision Other (Comment) Left;Proximal 10/22/23  1500  Other (Comment)  25   Wound 10/29/23 1410 Other (Comment) Thigh Distal;Left;Posterior 10/29/23  1410  Thigh  18        Resolved problem list   Assessment and Plan  Acute on chronic HFrEF with cardiogenic shock  Acute respiratory failure with hypoxia and hypercapnia due to pulmonary edema and possible aspiration pneumonia Multivessel coronary artery disease with recent acute NSTEMI Severe aortic stenosis  Recent MRSA bacteremia and 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 Peripheral arterial disease Hypothyroidism AKI on CKD stage IIIa, likely due to cardiorenal syndrome Hypervolemic hyponatremia/hyperphosphatemia Diabetes type 2, complicated with vasculopathy Hypertension Obesity  Advanced heart failure team is following Coox is 66% Continue dobutamine  at 5 Continue to require vasopressor support with Levophed  and vasopressin  though  requirement is better, currently on Levophed  of 8 mics Not much urine output despite high-dose Lasix  Serum creatinine continued to rise Plan for CRRT today Avoid nephrotoxic agent Monitor intake and output Closely monitor electrolytes Continue lung protective ventilation FiO2 and PEEP titrated down She is tolerating spontaneous breathing trial but not ready for extubation Not ready for GDMT in the setting of shock She will need CABG and aortic valve replacement once clinically stable Continue broad-spectrum antibiotics with daptomycin  and Zosyn  Cultures have been negative so far Continue aspirin  and statin Monitor fingerstick goal 140-180 Diet and exercise counseling when appropriate   Best Practice (right click and Reselect all SmartList Selections daily)   Diet/type: Tube feeds DVT prophylaxis prophylactic heparin   Pressure ulcer(s): Please see nursing notes GI prophylaxis: PPI Lines: Central line Foley:  Yes, and it is still needed Code Status:  full code Last date of multidisciplinary goals of care discussion [7/19: updated mother at bedside]  Labs   CBC: Recent Labs  Lab 11/10/23 0305 11/11/23 0601 11/12/23 0405 11/13/23 0520 11/14/23 0502 11/14/23 1752 11/15/23 0455 11/15/23 0819 11/15/23 1606 11/16/23 0129  WBC 10.3   < > 11.4* 14.0* 13.2*  --  10.3  --  15.3* 14.8*  NEUTROABS 6.8  --  7.7 10.2*  --   --   --   --  12.2* 11.3*  HGB 8.2*   < > 9.1* 9.2* 9.0* 8.5* 7.7* 8.2* 7.8* 7.6*  HCT 26.4*   < > 29.5* 28.8* 28.9* 25.0* 25.5* 24.0* 25.9* 25.0*  MCV 90.4   < > 91.9 89.7 90.3  --  91.7  --  92.8 94.0  PLT 386   < > 496* 571* 510*  --  403*  --  531* 536*   < > = values in this interval not displayed.    Basic Metabolic Panel: Recent Labs  Lab 11/14/23 0502 11/14/23 1752 11/14/23 1833 11/15/23 0455 11/15/23 0819 11/15/23 1413 11/16/23 0129  NA 136   < > 136 142 139 137 134*  K 3.5   < > 3.6 3.9 4.2 4.6 4.3  CL 98  --  100 103  --  102 98  CO2  23  --  24 24  --  22 22  GLUCOSE 178*  --  205* 169*  --  221* 302*  BUN 40*  --  45* 45*  --  48* 53*  CREATININE 1.56*  --  1.78* 2.18*  --  2.54* 2.97*  CALCIUM  8.8*  --  8.6* 8.4*  --  8.5* 8.3*  MG 1.8  --   --  1.7  --   --  2.6*  PHOS  --   --   --   --   --   --  6.0*   < > = values in this interval not displayed.   GFR: Estimated Creatinine Clearance: 23.3 mL/min (A) (by C-G formula based on SCr of 2.97 mg/dL (H)). Recent Labs  Lab 11/12/23 0404 11/12/23 0405 11/12/23 2133 11/13/23 0520 11/14/23 0502 11/14/23 1833 11/15/23 0455 11/15/23 1606 11/16/23 0129  PROCALCITON 0.27  --   --   --   --  1.36  --   --   --   WBC  --    < >  --    < > 13.2*  --  10.3 15.3* 14.8*  LATICACIDVEN  --   --  1.3  --   --  1.0  --   --   --    < > = values in this interval not displayed.    Liver Function Tests: Recent Labs  Lab 11/12/23 1921 11/14/23 1833  AST 24 16  ALT 19 16  ALKPHOS 115 120  BILITOT 0.9 0.7  PROT 8.0 7.2  ALBUMIN  2.2* 2.2*   No results for input(s): LIPASE, AMYLASE in the last 168 hours. No results for input(s): AMMONIA in the last 168 hours.  ABG    Component Value Date/Time   PHART 7.343 (L) 11/15/2023 0819   PCO2ART 45.2 11/15/2023 0819   PO2ART 117 (H) 11/15/2023 0819   HCO3 24.2 11/15/2023 0819   TCO2 25 11/15/2023 0819   ACIDBASEDEF 1.0 11/15/2023 0819   O2SAT 66.8 11/16/2023 0600     Coagulation Profile: Recent Labs  Lab 11/12/23 1921  INR 1.3*    Cardiac Enzymes: Recent Labs  Lab 11/11/23 0601  CKTOTAL 106    HbA1C: Hgb A1c MFr Bld  Date/Time Value Ref Range Status  10/28/2023 07:40 PM 5.5 4.8 - 5.6 % Final    Comment:    (NOTE) Diagnosis of Diabetes The following HbA1c ranges recommended by the American Diabetes Association (ADA) may be used as an aid in the diagnosis of diabetes mellitus.  Hemoglobin             Suggested A1C NGSP%              Diagnosis  <5.7                   Non Diabetic  5.7-6.4                 Pre-Diabetic  >6.4                   Diabetic  <7.0                   Glycemic control for                       adults with diabetes.    08/02/2022 08:48 PM 6.4 (H) 4.8 - 5.6 % Final    Comment:    (NOTE)         Prediabetes: 5.7 - 6.4         Diabetes: >6.4         Glycemic control for adults with diabetes: <7.0     CBG: Recent Labs  Lab 11/15/23 1642 11/15/23 2014 11/15/23 2357 11/16/23 0407 11/16/23 0733  GLUCAP 214* 253* 249* 248* 236*    The patient is critically ill due to acute respiratory failure with hypoxia and hypercapnia/acute on chronic HFpEF with cardiogenic shock.  Critical care was necessary to treat or prevent imminent or life-threatening deterioration.  Critical care was time spent personally by me on the following activities: development of treatment plan with patient and/or surrogate as well as nursing, discussions with consultants, evaluation of patient's response to treatment, examination of patient, obtaining history from patient or surrogate, ordering and performing treatments and interventions, ordering and review of laboratory studies, ordering and review of radiographic studies, pulse oximetry, re-evaluation of patient's condition and participation in  multidisciplinary rounds.   During this encounter critical care time was devoted to patient care services described in this note for 42 minutes.     Valinda Novas, MD [facility] See Amion for pager If no response to pager, please call [phone] until 7pm After 7pm, Please call E-link [phone]

## 2023-11-16 NOTE — Progress Notes (Signed)
 Pt changed back to [facility]VC d/t c/o sob. Tolerating well.

## 2023-11-16 NOTE — Consult Note (Signed)
 [facility] Nephrology Consultation Note  Requesting MD: Dr. Harold, Valinda Reason for consult: AKI, oliguria  HPI:  Kayla Schmitt is a 58 y.o. female with past medical history significant for hypertension, diabetes, HLD, anemia, CAD with severe three-vessel disease, PAD with prior SFA stent and bilateral TMA was admitted on 6/25 with MRSA bacteremia and necrotizing fasciitis status post left BKA, transferred back to [facility] from rehab due to acute on chronic CHF and respiratory failure, seen as a consultation for the evaluation of AKI on CKD. The patient is currently on broad-spectrum antibiotics for the history of necrotizing fasciitis and MRSA bacteremia.  The patient was discharged to inpatient rehab but developed shortness of breath consistent with CHF and multiple hospital.  She was tried IV diuretics and inotropes without much improvement in volume status.  Currently on Lasix  drips 30 mg/h with the help of vasopressin  and dobutamine  with only urine output of 220 cc.  Also received metolazone .  Remains hypotensive requiring multiple pressors including Levophed .  Currently intubated and sedated for respiratory failure.  The CVP remains high.  We are consulted to facilitate CRRT in order to optimize volume status. Unable to obtain review of system as patient is currently intubated however she is able to open eyes and follow basic commands.  I have discussed with the bedside nurse, ICU provider and also discussed with patient's mother over the phone.  She agreed with the plan.  PMHx:   Past Medical History:  Diagnosis Date   Anemia    Aortic stenosis    Arthritis    CHF (congestive heart failure) (HCC)    Coronary artery disease    Diabetes mellitus without complication (HCC)    type 2   GERD (gastroesophageal reflux disease)    Heart murmur    History of blood transfusion    Hypercholesteremia    Hypertension    Hypothyroidism    Mitral regurgitation    Myocardial  infarction (HCC)    Peripheral vascular disease (HCC)    PONV (postoperative nausea and vomiting)    Thyroid disease     Past Surgical History:  Procedure Laterality Date   ABDOMINAL AORTOGRAM W/LOWER EXTREMITY Bilateral 01/14/2020   Procedure: ABDOMINAL AORTOGRAM W/LOWER EXTREMITY;  Surgeon: Gretta Lonni PARAS, MD;  Location: [facility];  Service: Cardiovascular;  Laterality: Bilateral;   ABDOMINAL AORTOGRAM W/LOWER EXTREMITY N/A 07/05/2022   Procedure: ABDOMINAL AORTOGRAM W/LOWER EXTREMITY;  Surgeon: Gretta Lonni PARAS, MD;  Location: [facility];  Service: Cardiovascular;  Laterality: N/A;   ABDOMINAL AORTOGRAM W/LOWER EXTREMITY N/A 08/02/2022   Procedure: ABDOMINAL AORTOGRAM W/LOWER EXTREMITY;  Surgeon: Gretta Lonni PARAS, MD;  [facility]: [facility];  Service: Cardiovascular;  Laterality: N/A;   ABDOMINAL AORTOGRAM W/LOWER EXTREMITY N/A 08/06/2022   Procedure: ABDOMINAL AORTOGRAM W/LOWER EXTREMITY;  Surgeon: Gretta Lonni PARAS, MD;  [facility]: [facility];  Service: Cardiovascular;  Laterality: N/A;   AMPUTATION Bilateral 07/20/2022   Procedure: AMPUTATION RIGHT GREAT TOE AND SECOND TOE, AMPUTATION LEFT GREAT TOE;  Surgeon: Harden Jerona GAILS, MD;  Location: [facility] OR;  Service: Orthopedics;  Laterality: Bilateral;   AMPUTATION Bilateral 08/08/2022   Procedure: BILATERAL TRANSMETATARSAL AMPUTATION;  Surgeon: Harden Jerona GAILS, MD;  Location: [facility] OR;  Service: Orthopedics;  Laterality: Bilateral;   AMPUTATION Left 10/22/2023   Procedure: AMPUTATION BELOW KNEE;  Surgeon: Lanis Fonda BRAVO, MD;  Location: [facility] OR;  Service: Vascular;  Laterality: Left;   AMPUTATION TOE     BIOPSY  04/06/2023  Procedure: BIOPSY;  Surgeon: Bonnie Elspeth SQUIBB, MD;  Location: [facility] ENDOSCOPY;  Service: Gastroenterology;;   COLONOSCOPY WITH PROPOFOL  N/A 04/06/2023   Procedure: COLONOSCOPY WITH PROPOFOL ;  Surgeon: Annalie Elspeth SQUIBB, MD;  Location: [facility] ENDOSCOPY;  Service: Gastroenterology;   Laterality: N/A;   ESOPHAGOGASTRODUODENOSCOPY (EGD) WITH PROPOFOL  N/A 04/06/2023   Procedure: ESOPHAGOGASTRODUODENOSCOPY (EGD) WITH PROPOFOL ;  Surgeon: Mekala Elspeth SQUIBB, MD;  Location: [facility] ENDOSCOPY;  Service: Gastroenterology;  Laterality: N/A;   GIVENS CAPSULE STUDY N/A 04/06/2023   Procedure: GIVENS CAPSULE STUDY;  Surgeon: Issabela Elspeth SQUIBB, MD;  Location: [facility] ENDOSCOPY;  Service: Gastroenterology;  Laterality: N/A;   PERIPHERAL VASCULAR ATHERECTOMY Right 01/14/2020   Procedure: PERIPHERAL VASCULAR ATHERECTOMY;  Surgeon: Gretta Lonni PARAS, MD;  Location: [facility] INVASIVE CV LAB;  Service: Cardiovascular;  Laterality: Right;  sfa   PERIPHERAL VASCULAR INTERVENTION Right 01/14/2020   Procedure: PERIPHERAL VASCULAR INTERVENTION;  Surgeon: Gretta Lonni PARAS, MD;  [facility];  Service: Cardiovascular;  Laterality: Right;  sfa stent    PERIPHERAL VASCULAR INTERVENTION  07/05/2022   Procedure: PERIPHERAL VASCULAR INTERVENTION;  Surgeon: Gretta Lonni PARAS, MD;  [facility];  Service: Cardiovascular;;   PERIPHERAL VASCULAR INTERVENTION  08/02/2022   Procedure: PERIPHERAL VASCULAR INTERVENTION;  Surgeon: Gretta Lonni PARAS, MD;  [facility];  Service: Cardiovascular;;   PERIPHERAL VASCULAR INTERVENTION  08/06/2022   Procedure: PERIPHERAL VASCULAR INTERVENTION;  Surgeon: Gretta Lonni PARAS, MD;  [facility] INVASIVE CV LAB;  Service: Cardiovascular;;   PERIPHERAL VASCULAR THROMBECTOMY  08/02/2022   Procedure: PERIPHERAL VASCULAR THROMBECTOMY;  Surgeon: Gretta Lonni PARAS, MD;  [facility];  Service: Cardiovascular;;   REVISION AMPUTATION, BELOW THE KNEE Left 10/23/2023   Procedure: REVISION AMPUTATION, BELOW THE KNEE;  Surgeon: Harden Jerona GAILS, MD;  Location: [facility] OR;  Service: Orthopedics;  Laterality: Left;  REVISION LEFT BELOW KNEE AMPUTATION   RIGHT/LEFT HEART CATH AND CORONARY ANGIOGRAPHY N/A 08/23/2022   Procedure: RIGHT/LEFT  HEART CATH AND CORONARY ANGIOGRAPHY;  Surgeon: Dann Candyce RAMAN, MD;  Location: [facility] INVASIVE CV LAB;  Service: Cardiovascular;  Laterality: N/A;   SKIN SPLIT GRAFT Right 03/18/2020   Procedure: SKIN GRAFTING RIGHT FOOT ULCER;  Surgeon: Harden Jerona GAILS, MD;  Location: [facility] OR;  Service: Orthopedics;  Laterality: Right;   STUMP REVISION Right 11/30/2022   Procedure: REVISION RIGHT TRANSMETATARSAL AMPUTATION;  Surgeon: Harden Jerona GAILS, MD;  Location: [facility] OR;  Service: Orthopedics;  Laterality: Right;   TEE WITHOUT CARDIOVERSION N/A 08/27/2022   Procedure: TRANSESOPHAGEAL ECHOCARDIOGRAM;  Surgeon: Hobart Powell BRAVO, MD;  Location: [facility] INVASIVE CV LAB;  Service: Cardiovascular;  Laterality: N/A;   TRANSESOPHAGEAL ECHOCARDIOGRAM (CATH LAB) N/A 10/24/2023   Procedure: TRANSESOPHAGEAL ECHOCARDIOGRAM;  Surgeon: Michele Richardson, DO;  [facility];  Service: Cardiovascular;  Laterality: N/A;   WISDOM TOOTH EXTRACTION      Family Hx:  Family History  Problem Relation Age of Onset   Diabetes Other    CAD Other     Social History:  reports that she quit smoking about 6 years ago. Her smoking use included cigarettes. She has never used smokeless tobacco. She reports that she does not currently use alcohol. She reports current drug use. Drug: Marijuana.  Allergies:  Allergies  Allergen Reactions   Trental  [Pentoxifylline ] Nausea And Vomiting   Vibramycin [Doxycycline] Nausea And Vomiting    Medications: Prior to Admission medications   Medication Sig Start Date End Date Taking? Authorizing Provider  acetaminophen  (TYLENOL ) 500 MG  tablet Take 1,000 mg by mouth 2 (two) times daily as needed for moderate pain (pain score 4-6), headache or fever.   Yes [provider]  Alcohol Swabs  (PHARMACIST CHOICE ALCOHOL) PADS Apply topically. 04/09/23  Yes [provider]  aspirin  81 MG EC tablet Take 81 mg by mouth daily.   Yes [provider]  atorvastatin  (LIPITOR) 40 MG  tablet Take 40 mg by mouth daily. 12/14/19  Yes [provider]  bisacodyl  (DULCOLAX) 10 MG suppository Place 1 suppository (10 mg total) rectally daily as needed for moderate constipation or mild constipation. 10/29/23  Yes Hongalgi, Anand D, MD  clopidogrel  (PLAVIX ) 75 MG tablet TAKE 1 TABLET BY MOUTH EVERY DAY WITH BREAKFAST 01/28/23  Yes Sheree Penne Bruckner, MD  daptomycin  (CUBICIN ) IVPB Inject 700 mg into the vein daily for 6 days. Indication:  MRSA bacteremia First Dose: Yes Last Day of Therapy:  11/17/23 Labs - Once weekly:  CBC/D, BMP, and CPK Labs - Once weekly: ESR and CRP Method of administration: IV Push Method of administration may be changed at the discretion of home infusion pharmacist based upon assessment of the patient and/or caregiver's ability to self-administer the medication ordered. Followed by ID physician: Dr. Annalee Orem 11/11/23 11/17/23 Yes Love, Sharlet RAMAN, PA-C  diphenhydrAMINE  HCl, Sleep, (ZZZQUIL) 25 MG CAPS Take 25 mg by mouth at bedtime as needed (Sleep).   Yes [provider]  ferrous sulfate  325 (65 FE) MG EC tablet Take 1 tablet (325 mg total) by mouth daily with breakfast. 04/07/23 04/06/24 Yes Fairy Frames, MD  furosemide  (LASIX ) 40 MG tablet Take 1 tablet (40 mg total) by mouth 2 (two) times daily. 08/27/22  Yes Leotis Bogus, MD  gabapentin  (NEURONTIN ) 300 MG capsule Take 300 mg by mouth at bedtime.   Yes [provider]  insulin  degludec (TRESIBA  FLEXTOUCH) 100 UNIT/ML FlexTouch Pen Inject 10 Units into the skin at bedtime. Patient taking differently: Inject 16 Units into the skin at bedtime. 01/15/20  Yes Danford, Bruckner SQUIBB, MD  levothyroxine  (SYNTHROID ) 150 MCG tablet Take 150 mcg by mouth daily before breakfast. 12/02/19  Yes [provider]  metoprolol  succinate (TOPROL -XL) 25 MG 24 hr tablet Take 0.5 tablets (12.5 mg total) by mouth daily. 08/28/22  Yes Leotis Bogus, MD  oxyCODONE  (ROXICODONE ) 5 MG immediate  release tablet Take 1 tablet (5 mg total) by mouth every 6 (six) hours as needed for severe pain (pain score 7-10). 10/15/23  Yes Francesca Elsie CROME, MD  pantoprazole  (PROTONIX ) 40 MG tablet Take 40 mg by mouth daily. 12/21/19  Yes [provider]  polyethylene glycol (MIRALAX  / GLYCOLAX ) 17 g packet Take 17 g by mouth 2 (two) times daily. 10/29/23  Yes Hongalgi, Anand D, MD  promethazine  (PHENERGAN ) 25 MG tablet Take 1 tablet (25 mg total) by mouth every 6 (six) hours as needed for nausea or vomiting. 10/15/23  Yes Francesca Elsie CROME, MD  senna-docusate (SENOKOT-S) 8.6-50 MG tablet Take 1 tablet by mouth 2 (two) times daily. 10/29/23  Yes Hongalgi, Anand D, MD  nitroGLYCERIN  (NITROSTAT ) 0.4 MG SL tablet Place 1 tablet (0.4 mg total) under the tongue every 5 (five) minutes x 3 doses as needed for chest pain (if no relief after 3rd dose proceed to ED or call 911). 11/06/2022-New 11/06/22   Mallipeddi, Diannah SQUIBB, MD    I have reviewed the patient's current medications.  Labs: Renal Panel: Recent Labs  Lab 11/14/23 0502 11/14/23 1752 11/14/23 1833 11/15/23 0455 11/15/23 9180 11/15/23  1413 11/16/23 0129  NA 136   < > 136 142 139 137 134*  K 3.5   < > 3.6 3.9 4.2 4.6 4.3  CL 98  --  100 103  --  102 98  CO2 23  --  24 24  --  22 22  GLUCOSE 178*  --  205* 169*  --  221* 302*  BUN 40*  --  45* 45*  --  48* 53*  CREATININE 1.56*  --  1.78* 2.18*  --  2.54* 2.97*  CALCIUM  8.8*  --  8.6* 8.4*  --  8.5* 8.3*  MG 1.8  --   --  1.7  --   --  2.6*  PHOS  --   --   --   --   --   --  6.0*   < > = values in this interval not displayed.     CBC:    Latest Ref Rng & Units 11/16/2023    1:29 AM 11/15/2023    4:06 PM 11/15/2023    8:19 AM  CBC  WBC 4.0 - 10.5 K/uL 14.8  15.3    Hemoglobin 12.0 - 15.0 g/dL 7.6  7.8  8.2   Hematocrit 36.0 - 46.0 % 25.0  25.9  24.0   Platelets 150 - 400 K/uL 536  531       Anemia Panel:  Recent Labs    04/03/23 1622 04/04/23 0442 07/04/23 1507  09/24/23 1515 10/15/23 1649 11/12/23 1921 11/13/23 0520 11/14/23 1752 11/15/23 0455 11/15/23 0819 11/15/23 1606 11/16/23 0129  HGB  --    < > 11.7* 10.2*   < >  --    < > 8.5* 7.7* 8.2* 7.8* 7.6*  MCV  --    < > 87.8 87.1   < >  --    < >  --  91.7  --  92.8 94.0  VITAMINB12  --   --   --   --   --  658  --   --   --   --   --   --   FOLATE  --   --   --   --   --  9.6  --   --   --   --   --   --   FERRITIN 67  --  195.2 209.5  --  989*  --   --   --   --   --   --   TIBC 214*  --  245.0* 217.0*  --  162*  --   --   --   --   --   --   IRON  26*  --  68 34*  --  18*  --   --   --   --   --   --   RETICCTPCT  --   --   --   --   --  2.6  --   --   --   --  2.5  --    < > = values in this interval not displayed.    Recent Labs  Lab 11/12/23 1921 11/14/23 1833  AST 24 16  ALT 19 16  ALKPHOS 115 120  BILITOT 0.9 0.7  PROT 8.0 7.2  ALBUMIN  2.2* 2.2*    Lab Results  Component Value Date   HGBA1C 5.5 10/28/2023    ROS: As per H&P, rest of the systems reviewed and negative.  Physical  Exam: Vitals:   11/16/23 0818 11/16/23 0830  BP:  112/71  Pulse: (!) 105 (!) 107  Resp: 14 20  Temp: (!) 97.5 F (36.4 C) (!) 97.5 F (36.4 C)  SpO2: 100% 100%     General exam: Intubated and sedated.  Able to open eyes and following basic commands Respiratory system: Coarse breath sound bilateral, Cardiovascular system: S1 & S2 heard, RRR.  Pedal edema present. Gastrointestinal system: Abdomen is nondistended, soft . Normal bowel sounds heard. Central nervous system: Sedated but able to open eyes. Extremities: Left BKA, bilateral edema.   Skin: No rashes, lesions or ulcers Psychiatry: Currently on sedation.  Assessment/Plan:  # Oliguric acute kidney injury refractory to diuretics and inotropes with evidence of fluid overload: Due to cardiorenal syndrome. Plan to initiate CRRT mainly to optimize volume and manage CHF. ICU team to place dialysis line. Start 4K bath, fixed dose  heparin  for anticoagulation, UF around 100 cc net negative as tolerated. Discussed with ICU team and patient's mother, all are in agreement.  # Acute on chronic CHF/cardiogenic shock, EF 25%.  Currently on dobutamine , vasopressin  and diuretics without much response.  Starting CRRT as above.  Heart failure team is following.  # Acute respiratory failure with hypoxia: Currently intubated.  On antibiotics and managing volume as above.  # History of MRSA bacteremia, necrotizing fasciitis status post recent left BKA: Currently on antibiotics per primary team.  # CAD with an NSTEMI in 07/2022, multivessel CAD.  # Anemia: Transfuse as needed also history of GI bleed in the past.  Thank you for the consult.  Will continue to follow.   Lash Matulich Amelie Romney 11/16/2023, 9:51 AM  [employer].

## 2023-11-17 ENCOUNTER — Inpatient Hospital Stay (HOSPITAL_COMMUNITY)

## 2023-11-17 LAB — GLUCOSE, CAPILLARY
Glucose-Capillary: 154 mg/dL — ABNORMAL HIGH (ref 70–99)
Glucose-Capillary: 173 mg/dL — ABNORMAL HIGH (ref 70–99)
Glucose-Capillary: 177 mg/dL — ABNORMAL HIGH (ref 70–99)
Glucose-Capillary: 190 mg/dL — ABNORMAL HIGH (ref 70–99)
Glucose-Capillary: 190 mg/dL — ABNORMAL HIGH (ref 70–99)
Glucose-Capillary: 214 mg/dL — ABNORMAL HIGH (ref 70–99)

## 2023-11-17 LAB — COOXEMETRY PANEL
Carboxyhemoglobin: 1.8 % — ABNORMAL HIGH (ref 0.5–1.5)
Methemoglobin: 1.8 % — ABNORMAL HIGH (ref 0.0–1.5)
O2 Saturation: 70.2 %
Total hemoglobin: 7.3 g/dL — ABNORMAL LOW (ref 12.0–16.0)

## 2023-11-17 LAB — APTT: aPTT: 50 s — ABNORMAL HIGH (ref 24–36)

## 2023-11-17 LAB — RENAL FUNCTION PANEL
Albumin: 1.9 g/dL — ABNORMAL LOW (ref 3.5–5.0)
Anion gap: 14 (ref 5–15)
BUN: 29 mg/dL — ABNORMAL HIGH (ref 6–20)
CO2: 21 mmol/L — ABNORMAL LOW (ref 22–32)
Calcium: 8.4 mg/dL — ABNORMAL LOW (ref 8.9–10.3)
Chloride: 98 mmol/L (ref 98–111)
Creatinine, Ser: 1.77 mg/dL — ABNORMAL HIGH (ref 0.44–1.00)
GFR, Estimated: 33 mL/min — ABNORMAL LOW (ref >60)
Glucose, Bld: 214 mg/dL — ABNORMAL HIGH (ref 70–99)
Phosphorus: 2.7 mg/dL (ref 2.5–4.6)
Potassium: 4.6 mmol/L (ref 3.5–5.1)
Sodium: 133 mmol/L — ABNORMAL LOW (ref 135–145)

## 2023-11-17 LAB — BASIC METABOLIC PANEL WITH GFR
Anion gap: 13 (ref 5–15)
BUN: 30 mg/dL — ABNORMAL HIGH (ref 6–20)
CO2: 23 mmol/L (ref 22–32)
Calcium: 8 mg/dL — ABNORMAL LOW (ref 8.9–10.3)
Chloride: 99 mmol/L (ref 98–111)
Creatinine, Ser: 0.79 mg/dL (ref 0.44–1.00)
GFR, Estimated: >60 mL/min (ref >60)
Glucose, Bld: 166 mg/dL — ABNORMAL HIGH (ref 70–99)
Potassium: 4.2 mmol/L (ref 3.5–5.1)
Sodium: 135 mmol/L (ref 135–145)

## 2023-11-17 LAB — CULTURE, RESPIRATORY W GRAM STAIN: Culture: NORMAL

## 2023-11-17 LAB — CULTURE, BLOOD (ROUTINE X 2)
Culture: NO GROWTH
Culture: NO GROWTH

## 2023-11-17 LAB — PHOSPHORUS: Phosphorus: 3.3 mg/dL (ref 2.5–4.6)

## 2023-11-17 LAB — MAGNESIUM: Magnesium: 2.3 mg/dL (ref 1.7–2.4)

## 2023-11-17 MED ORDER — LIDOCAINE 4 % EX CREA
TOPICAL_CREAM | Freq: Two times a day (BID) | CUTANEOUS | Status: DC | PRN
  Administered 2023-11-17 – 2023-11-20 (×3): 1 via TOPICAL
  Filled 2023-11-17: qty 5

## 2023-11-17 NOTE — Progress Notes (Signed)
 eLink Physician-Brief Progress Note Patient Name: Kayla Schmitt DOB: 1966-02-23 MRN: [id number]   Date of Service  11/17/2023  HPI/Events of Note  RN requesting something as needed for agitation.  eICU Interventions  Video assessment of patient.  Observed here for about 6 minutes straight and she is asleep currently.  Will monitor overnight and reassess if she starts getting anxious and agitated again.  Discussed with RN.     Intervention Category Minor Interventions: Agitation / anxiety - evaluation and management  Jerilynn Berg 11/17/2023, 8:30 PM  Addendum: Hemoglobin from this morning is 6.5.  No evidence of bleeding. Will transfuse 1 unit of packed red cells to get hemoglobin greater than 7. 6:37 AM

## 2023-11-17 NOTE — Progress Notes (Signed)
 Advanced Heart Failure Rounding Note  Cardiologist: Vishnu P Mallipeddi, MD  Chief Complaint: Acute on chronic HFpEF / RV Failure Subjective:    Placed on CRRT yesterday, net negative 1.7L, increasing today to 181mL/hr, on low dose levophed  and DBA. Given improved vent settings will plan to extubate today.   Objective:    Weight Range: 92.5 kg Body mass index is 35 kg/m.   Vital Signs:   Temp:  [97.9 F (36.6 C)-99.3 F (37.4 C)] 98.8 F (37.1 C) (07/20 1406) Pulse Rate:  [105-119] 107 (07/20 1406) Resp:  [17-37] 30 (07/20 1406) SpO2:  [91 %-100 %] 94 % (07/20 1406) Arterial Line BP: (86-111)/(50-70) 97/54 (07/20 1400) FiO2 (%):  [40 %] 40 % (07/20 0822) Last BM Date : 11/16/23  Weight change: Filed Weights   11/14/23 0438 11/15/23 0500 11/16/23 0701  Weight: 91.7 kg 92.5 kg 92.5 kg   Intake/Output:  Intake/Output Summary (Last 24 hours) at 11/17/2023 1424 Last data filed at 11/17/2023 1400 Gross per 24 hour  Intake 1636.12 ml  Output 4951.1 ml  Net -3314.98 ml    Physical Exam    CVP 13 General: Chronically -ill appearing. On vent. Cardiac:  Soft, harsh, late peaking systolic murmur, tachycardic Extremities: Warm  Prior bilateral BKA,  Neuro: Awakens to voice, following commands  Telemetry   ST 110s (personally reviewed)  Labs    CBC Recent Labs    11/15/23 1606 11/16/23 0129  WBC 15.3* 14.8*  NEUTROABS 12.2* 11.3*  HGB 7.8* 7.6*  HCT 25.9* 25.0*  MCV 92.8 94.0  PLT 531* 536*   Basic Metabolic Panel Recent Labs    92/80/74 0129 11/16/23 1602 11/17/23 0525  NA 134* 135 135  K 4.3 4.2 4.2  CL 98 98 99  CO2 22 21* 23  GLUCOSE 302* 213* 166*  BUN 53* 51* 30*  CREATININE 2.97* 2.88* 0.79  CALCIUM  8.3* 8.1* 8.0*  MG 2.6*  --  2.3  PHOS 6.0* 5.7* 3.3   Liver Function Tests Recent Labs    11/14/23 1833 11/16/23 1602  AST 16  --   ALT 16  --   ALKPHOS 120  --   BILITOT 0.7  --   PROT 7.2  --   ALBUMIN  2.2* 1.8*   BNP (last 3  results) Recent Labs    11/03/23 0112 11/07/23 1457 11/13/23 0521  BNP 793.8* 336.4* 1,626.5*   Medications:    Scheduled Medications:  aspirin   81 mg Per Tube Daily   atorvastatin   40 mg Per Tube Daily   Chlorhexidine  Gluconate Cloth  6 each Topical Daily   docusate  100 mg Per Tube BID   gabapentin   300 mg Per Tube QHS   heparin  injection (subcutaneous)  5,000 Units Subcutaneous Q8H   insulin  aspart  0-15 Units Subcutaneous Q4H   insulin  glargine-yfgn  6 Units Subcutaneous BID   ipratropium-albuterol   3 mL Nebulization Q6H   levothyroxine   150 mcg Per Tube QAC breakfast   linezolid   600 mg Per Tube Q12H   multivitamin  1 tablet Per Tube QHS   pantoprazole  (PROTONIX ) IV  40 mg Intravenous QHS   polyethylene glycol  17 g Per Tube BID   senna-docusate  1 tablet Per Tube BID   sodium chloride  flush  10-40 mL Intracatheter Q12H   Infusions:  DOBUTamine  5 mcg/kg/min (11/17/23 1400)   heparin  10,000 units/ 20 mL infusion syringe 500 Units/hr (11/17/23 0110)   norepinephrine  (LEVOPHED ) Adult infusion 5 mcg/min (11/17/23 1400)  piperacillin -tazobactam (ZOSYN )  IV Stopped (11/17/23 1157)   prismasol  BGK 4/2.5 400 mL/hr at 11/17/23 1355   prismasol  BGK 4/2.5 400 mL/hr at 11/17/23 1358   prismasol  BGK 4/2.5 1,500 mL/hr at 11/17/23 1210   propofol  (DIPRIVAN ) infusion Stopped (11/17/23 1142)   vasopressin  0.03 Units/min (11/17/23 1400)   PRN Medications: acetaminophen , fentaNYL  (SUBLIMAZE ) injection, heparin , methocarbamol , mouth rinse, oxyCODONE -acetaminophen , prochlorperazine , sodium chloride  flush  Patient Profile   58 y.o. female with history of PAD with prior SFA stents and bilateral TMA, severe 3v CAD, MR/AS, DM II, anemia, HTN, HLD, hypothyroidism.    Admitted in 06/25 with MRSA bactermia and necrotizing fasciitis s/p left BKA. Transferred back to [facility] from rehab d/t acute on chronic CHF with concern for low-output and acute respiratory failure.  Assessment/Plan   Acute on  chronic HFpEF>Cardiogenic/mixed Shock - TTE 12/24: EF 40-45% - TTE 6/25: EF 50-55% - TEE 6/25: EF 60-65%, RV okay, mild to moderate MR, moderate to severe AS with mean gradient 27 mmHg and AVA 1.07 cm2 - Taken for RHC 7/17, however developed flash pulmonary edema. Unable to lie flat for procedure.  - Currently on CRRT, requiring low dose NE and DBA, but respiratory status significantly improved - Potentially wean DBA tomorrow - Echo images are incredibly difficult to interpret. CT or invasive hemodynamics may eventually be helpful for determining gradient, degree of calcification - However, she is not a TAVR or SAVR candidate currently so even if she has severe AS it would not change current management   2. Acute respiratory failure with hypoxia - CXR with b/l diffuse airspace opacities, likely pulmonary edema, though given fevers also concern for PNA - Plan for extubation, continue abx as below - CRRT    3. CAD - NSTEMI in 4/24. Multivessel CAD. CABG had been recommended but managed medically d/t other active medical issues - continue aspirin  + statin   4. Valvular heart disease - Mild to moderate MR and moderate to severe AS on echo 6/25 - Repeat echo with extremely poor windows, based on exam expect AS moderate-severe   5. 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 PAD - hx SFA stents - On aspirin  + statin. Had been on plavix  but stopped d/t risk of bleeding   7. Anemia - s/p 1 u RBC 7/14 - Hx GI bleed from AVMs, ? D/t aortic valve stenosis - hgb 7.6 today, stable.   8. ARF on CKD IIIa - In setting of volume overload/low-output - continue DBA and NE for blood pressure support - CRRT for volume removal, hope for renal recovery as she is not a long term iHD candidate  Length of Stay: 5  CRITICAL CARE Performed by: Morene JINNY Brownie   Total critical care time: 35  minutes  Critical care time was exclusive of separately billable procedures and treating other patients.  Critical care was necessary to treat or prevent imminent or life-threatening deterioration.  Critical care was time spent personally by me on the following activities: development of treatment plan with patient and/or surrogate as well as nursing, discussions with consultants, evaluation of patient's response to treatment, examination of patient, obtaining history from patient or surrogate, ordering and performing treatments and interventions, ordering and review of laboratory studies, ordering and review of radiographic studies, pulse oximetry and re-evaluation of patient's condition.   Morene JINNY Brownie, MD  11/17/2023, 2:24 PM  Advanced Heart Failure  Team Pager [phone] (M-F; 7a - 5p)  Please contact [facility] for night-coverage after hours (5p -7a ) and weekends on [email or web address]

## 2023-11-17 NOTE — Progress Notes (Signed)
 NAME:  Kayla Schmitt, MRN:  [id number], DOB:  06-16-1965, LOS: 5 ADMISSION DATE:  11/12/2023, CONSULTATION DATE:  11/14/23 REFERRING MD:  Bensihmon, CHIEF COMPLAINT:  SOB   History of Present Illness:  58 year old woman with ischemic cardiomyopathy, aortic stenosis, recent nec fasc s/p BKA 6/25 who was in CIR then developed worsening SOB so admitted back to [facility].  Stubborn diuresis so attempted RHC today but unable to lie flat.  CXR showing pulmonary edema, bringing to ICU for inotropes and BIPAP.  Full ROS as below, understandably anxious.  Pertinent  Medical History  CAD Aortic stenosis Class 3 obesity Nec fasc Hypothyroidism PVD DM2 GERD Anemia  Significant Hospital Events: Including procedures, antibiotic start and stop dates in addition to other pertinent events   7/16 admission 7/17 ICU, intubated 7/18 started spiking fever with Tmax 101.5, tachycardic.  X-ray chest still showing bilateral infiltrate but FiO2 is coming down.  Lasix  infusion increased to 30 mg/h 7/19 started on CRRT, tolerating spontaneous breathing trial  Interim History / Subjective:  Patient remained afebrile after switching antibiotic to Zosyn  She was started on CRRT yesterday, remain net -1.7 L Remain on vasopressin  and Levophed   Tolerating spontaneous breathing trial   Objective    Blood pressure 97/65, pulse (!) 110, temperature 98.2 F (36.8 C), resp. rate (!) 21, height 5' 4 (1.626 m), weight 92.5 kg, SpO2 98%. CVP:  [7 mmHg-57 mmHg] 15 mmHg  Vent Mode: CPAP;PSV FiO2 (%):  [40 %] 40 % Set Rate:  [26 bmp] 26 bmp Vt Set:  [430 mL] 430 mL PEEP:  [5 cmH20] 5 cmH20 Pressure Support:  [5 cmH20] 5 cmH20 Plateau Pressure:  [21 cmH20-24 cmH20] 21 cmH20   Intake/Output Summary (Last 24 hours) at 11/17/2023 9077 Last data filed at 11/17/2023 0900 Gross per 24 hour  Intake 1615.88 ml  Output 3722.1 ml  Net -2106.22 ml   Filed Weights   11/14/23 0438 11/15/23 0500 11/16/23 0701  Weight:  91.7 kg 92.5 kg 92.5 kg    Examination: General: Crtitically ill-appearing female, orally intubated HEENT: [place]/AT, eyes anicteric.  ETT and cortrak in place Neuro: Sedated, not following commands.  Eyes are closed.  Pupils 3 mm bilateral reactive to light Chest: Coarse breath sounds, no wheezes or rhonchi Heart: Regular rate and rhythm, no murmurs or gallops Abdomen: Soft, nondistended, bowel sounds present Extremities: Bilateral BKA  Labs and images reviewed  Patient Lines/Drains/Airways Status     Active Line/Drains/Airways     Name Placement date Placement time Site Days   Arterial Line 11/16/23 Right Brachial 11/16/23  1206  Brachial  1   PICC Single Lumen 10/26/23 Left Basilic 46 cm 1 cm 10/26/23  8788  Basilic  22   CVC Triple Lumen 11/14/23 Left Subclavian 11/14/23  1700  -- 3   Hemodialysis Catheter Right Internal jugular Triple lumen Temporary (Non-Tunneled) 11/16/23  1205  Internal jugular  1   NG/OG Vented/Dual Lumen 14 Fr. Oral External length of tube 68 cm 11/14/23  1700  Oral  3   Urethral Catheter Andie RN Temperature probe 14 Fr. 11/14/23  1700  Temperature probe  3   Fecal Management System 38 mL 11/16/23  0454  -- 1   Airway 7.5 mm 11/14/23  1655  -- 3   Wound 10/22/23 1500 Surgical Open Surgical Incision Other (Comment) Left;Proximal 10/22/23  1500  Other (Comment)  26   Wound 10/29/23 1410 Other (Comment) Thigh Distal;Left;Posterior 10/29/23  1410  Thigh  19  Resolved problem list  Hypervolemic hyponatremia/hyperphosphatemia  Assessment and Plan  Acute on chronic HFrEF with cardiogenic shock  Acute respiratory failure with hypoxia and hypercapnia due to pulmonary edema and possible aspiration pneumonia Multivessel coronary artery disease with recent acute NSTEMI Severe aortic stenosis Recent MRSA bacteremia and 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 Peripheral arterial disease Hypothyroidism AKI on CKD stage IIIa, likely due  to cardiorenal syndrome, currently on CRRT Diabetes type 2, complicated with vasculopathy Hypertension Obesity  Advanced heart failure team is following [place] improved to 70% this morning Remain on dobutamine  Not ready for GDMT in the setting of shock Still on vasopressor support with Levophed  at 4 mics and vasopressin  Started on CRRT, remain later -1.7 L Continue to remove volume via CRRT Continue broad-spectrum antibiotic with daptomycin  and Zosyn  Continue lung protective ventilation VAP prevention bundle in place PAD protocol with propofol  and as needed fentanyl  Patient is tolerating spontaneous breathing trial, currently in 5/5 Cultures have been negative Continue aspirin  and statin Continue insulin  with CBG goal 140-180 Continue tube feeds  Best Practice (right click and Reselect all SmartList Selections daily)   Diet/type: Tube feeds DVT prophylaxis prophylactic heparin   Pressure ulcer(s): Please see nursing notes GI prophylaxis: PPI Lines: Central line/HD catheter, still needed Foley:  Yes, and it is still needed Code Status:  full code Last date of multidisciplinary goals of care discussion [7/19: updated mother at bedside]  Labs   CBC: Recent Labs  Lab 11/12/23 0405 11/13/23 0520 11/14/23 0502 11/14/23 1752 11/15/23 0455 11/15/23 0819 11/15/23 1606 11/16/23 0129  WBC 11.4* 14.0* 13.2*  --  10.3  --  15.3* 14.8*  NEUTROABS 7.7 10.2*  --   --   --   --  12.2* 11.3*  HGB 9.1* 9.2* 9.0* 8.5* 7.7* 8.2* 7.8* 7.6*  HCT 29.5* 28.8* 28.9* 25.0* 25.5* 24.0* 25.9* 25.0*  MCV 91.9 89.7 90.3  --  91.7  --  92.8 94.0  PLT 496* 571* 510*  --  403*  --  531* 536*    Basic Metabolic Panel: Recent Labs  Lab 11/14/23 0502 11/14/23 1752 11/15/23 0455 11/15/23 0819 11/15/23 1413 11/16/23 0129 11/16/23 1602 11/17/23 0525  NA 136   < > 142 139 137 134* 135 135  K 3.5   < > 3.9 4.2 4.6 4.3 4.2 4.2  CL 98   < > 103  --  102 98 98 99  CO2 23   < > 24  --  22 22 21*  23  GLUCOSE 178*   < > 169*  --  221* 302* 213* 166*  BUN 40*   < > 45*  --  48* 53* 51* 30*  CREATININE 1.56*   < > 2.18*  --  2.54* 2.97* 2.88* 0.79  CALCIUM  8.8*   < > 8.4*  --  8.5* 8.3* 8.1* 8.0*  MG 1.8  --  1.7  --   --  2.6*  --  2.3  PHOS  --   --   --   --   --  6.0* 5.7* 3.3   < > = values in this interval not displayed.   GFR: Estimated Creatinine Clearance: 86.5 mL/min (by C-G formula based on SCr of 0.79 mg/dL). Recent Labs  Lab 11/12/23 0404 11/12/23 0405 11/12/23 2133 11/13/23 0520 11/14/23 0502 11/14/23 1833 11/15/23 0455 11/15/23 1606 11/16/23 0129  PROCALCITON 0.27  --   --   --   --  1.36  --   --   --  WBC  --    < >  --    < > 13.2*  --  10.3 15.3* 14.8*  LATICACIDVEN  --   --  1.3  --   --  1.0  --   --   --    < > = values in this interval not displayed.    Liver Function Tests: Recent Labs  Lab 11/12/23 1921 11/14/23 1833 11/16/23 1602  AST 24 16  --   ALT 19 16  --   ALKPHOS 115 120  --   BILITOT 0.9 0.7  --   PROT 8.0 7.2  --   ALBUMIN  2.2* 2.2* 1.8*   No results for input(s): LIPASE, AMYLASE in the last 168 hours. No results for input(s): AMMONIA in the last 168 hours.  ABG    Component Value Date/Time   PHART 7.343 (L) 11/15/2023 0819   PCO2ART 45.2 11/15/2023 0819   PO2ART 117 (H) 11/15/2023 0819   HCO3 24.2 11/15/2023 0819   TCO2 25 11/15/2023 0819   ACIDBASEDEF 1.0 11/15/2023 0819   O2SAT 70.2 11/17/2023 0525     Coagulation Profile: Recent Labs  Lab 11/12/23 1921  INR 1.3*    Cardiac Enzymes: Recent Labs  Lab 11/11/23 0601  CKTOTAL 106    HbA1C: Hgb A1c MFr Bld  Date/Time Value Ref Range Status  10/28/2023 07:40 PM 5.5 4.8 - 5.6 % Final    Comment:    (NOTE) Diagnosis of Diabetes The following HbA1c ranges recommended by the American Diabetes Association (ADA) may be used as an aid in the diagnosis of diabetes mellitus.  Hemoglobin             Suggested A1C NGSP%              Diagnosis  <5.7                    Non Diabetic  5.7-6.4                Pre-Diabetic  >6.4                   Diabetic  <7.0                   Glycemic control for                       adults with diabetes.    08/02/2022 08:48 PM 6.4 (H) 4.8 - 5.6 % Final    Comment:    (NOTE)         Prediabetes: 5.7 - 6.4         Diabetes: >6.4         Glycemic control for adults with diabetes: <7.0     CBG: Recent Labs  Lab 11/16/23 1940 11/16/23 2127 11/16/23 2315 11/17/23 0351 11/17/23 0721  GLUCAP 174* 164* 156* 154* 177*    The patient is critically ill due to acute respiratory failure with hypoxia and hypercapnia/acute on chronic HFpEF with cardiogenic shock.  Critical care was necessary to treat or prevent imminent or life-threatening deterioration.  Critical care was time spent personally by me on the following activities: development of treatment plan with patient and/or surrogate as well as nursing, discussions with consultants, evaluation of patient's response to treatment, examination of patient, obtaining history from patient or surrogate, ordering and performing treatments and interventions, ordering and review of laboratory studies, ordering and review of radiographic studies, pulse oximetry,  re-evaluation of patient's condition and participation in multidisciplinary rounds.   During this encounter critical care time was devoted to patient care services described in this note for 40 minutes.     Valinda Novas, MD [place] [facility] See Amion for pager If no response to pager, please call [phone] until 7pm After 7pm, Please call E-link [phone]

## 2023-11-17 NOTE — Progress Notes (Signed)
 Kayla Schmitt  Assessment/ Plan: Pt is a 58 y.o. yo female  with past medical history significant for hypertension, diabetes, HLD, anemia, CAD with severe three-vessel disease, PAD with prior SFA stent and bilateral TMA was admitted on 6/25 with MRSA bacteremia and necrotizing fasciitis status post left BKA, transferred back to [facility] ICU from rehab due to acute on chronic CHF and respiratory failure, seen as a consultation for the evaluation of AKI on CKD.   # Oliguric acute kidney injury refractory to diuretics and inotropes with evidence of fluid overload: Due to cardiorenal syndrome. - Started CRRT yesterday with UF around 100 cc an hour net negative, tolerating well.  Continue current CRRT prescription.  Fixed dose heparin  for anticoagulation.   - Continue to monitor urine output, labs.   # Acute on chronic CHF/cardiogenic shock, EF 25%.  Currently on dobutamine , vasopressin  and diuretics without much response.  Started CRRT as above.  Heart failure team is following.   # Acute respiratory failure with hypoxia: Currently intubated.  On antibiotics and managing volume as above.   # History of MRSA bacteremia, necrotizing fasciitis status post recent left BKA: Currently on antibiotics per primary team.   # CAD with an NSTEMI in 07/2022, multivessel CAD.   # Anemia: Transfuse as needed also history of GI bleed in the past.  Subjective: Seen and examined.  Patient is intubated, sedated.  Her mother was presented at the bedside.  Tolerating CRRT around 100 cc an hour.  Urine output is around 110 cc in 24 hours.  No other new event.  Discussed with ICU nurse. Objective Vital signs in last 24 hours: Vitals:   11/17/23 0800 11/17/23 0822 11/17/23 0830 11/17/23 0900  BP:      Pulse: (!) 110 (!) 109 (!) 105 (!) 110  Resp: (!) 26 (!) 24 (!) 26 (!) 21  Temp: 98.6 F (37 C) 98.4 F (36.9 C) 98.4 F (36.9 C) 98.2 F (36.8 C)  TempSrc:  Bladder    SpO2: 99%  99% 98% 98%  Weight:      Height:       Weight change:   Intake/Output Summary (Last 24 hours) at 11/17/2023 0932 Last data filed at 11/17/2023 0900 Gross per 24 hour  Intake 1615.88 ml  Output 3722.1 ml  Net -2106.22 ml       Labs: RENAL PANEL Recent Labs  Lab 11/12/23 1921 11/13/23 0520 11/14/23 0502 11/14/23 1752 11/14/23 1833 11/15/23 0455 11/15/23 0819 11/15/23 1413 11/16/23 0129 11/16/23 1602 11/17/23 0525  NA  --    < > 136   < > 136 142 139 137 134* 135 135  K  --    < > 3.5   < > 3.6 3.9 4.2 4.6 4.3 4.2 4.2  CL  --    < > 98  --  100 103  --  102 98 98 99  CO2  --    < > 23  --  24 24  --  22 22 21* 23  GLUCOSE  --    < > 178*  --  205* 169*  --  221* 302* 213* 166*  BUN  --    < > 40*  --  45* 45*  --  48* 53* 51* 30*  CREATININE  --    < > 1.56*  --  1.78* 2.18*  --  2.54* 2.97* 2.88* 0.79  CALCIUM   --    < > 8.8*  --  8.6* 8.4*  --  8.5* 8.3* 8.1* 8.0*  MG  --   --  1.8  --   --  1.7  --   --  2.6*  --  2.3  PHOS  --   --   --   --   --   --   --   --  6.0* 5.7* 3.3  ALBUMIN  2.2*  --   --   --  2.2*  --   --   --   --  1.8*  --    < > = values in this interval not displayed.    Liver Function Tests: Recent Labs  Lab 11/12/23 1921 11/14/23 1833 11/16/23 1602  AST 24 16  --   ALT 19 16  --   ALKPHOS 115 120  --   BILITOT 0.9 0.7  --   PROT 8.0 7.2  --   ALBUMIN  2.2* 2.2* 1.8*   No results for input(s): LIPASE, AMYLASE in the last 168 hours. No results for input(s): AMMONIA in the last 168 hours. CBC: Recent Labs    04/03/23 1622 04/04/23 0442 07/04/23 1507 09/24/23 1515 10/15/23 1649 11/12/23 1921 11/13/23 0520 11/14/23 1752 11/15/23 0455 11/15/23 0819 11/15/23 1606 11/16/23 0129  HGB  --    < > 11.7* 10.2*   < >  --    < > 8.5* 7.7* 8.2* 7.8* 7.6*  MCV  --    < > 87.8 87.1   < >  --    < >  --  91.7  --  92.8 94.0  VITAMINB12  --   --   --   --   --  658  --   --   --   --   --   --   FOLATE  --   --   --   --   --  9.6  --    --   --   --   --   --   FERRITIN 67  --  195.2 209.5  --  989*  --   --   --   --   --   --   TIBC 214*  --  245.0* 217.0*  --  162*  --   --   --   --   --   --   IRON  26*  --  68 34*  --  18*  --   --   --   --   --   --   RETICCTPCT  --   --   --   --   --  2.6  --   --   --   --  2.5  --    < > = values in this interval not displayed.    Cardiac Enzymes: Recent Labs  Lab 11/11/23 0601  CKTOTAL 106   CBG: Recent Labs  Lab 11/16/23 1940 11/16/23 2127 11/16/23 2315 11/17/23 0351 11/17/23 0721  GLUCAP 174* 164* 156* 154* 177*    Iron  Studies: No results for input(s): IRON , TIBC, TRANSFERRIN, FERRITIN in the last 72 hours. Studies/Results: DG CHEST PORT 1 VIEW Result Date: 11/16/2023 CLINICAL DATA:  Line placement EXAM: PORTABLE CHEST 1 VIEW COMPARISON:  X-ray 11/15/2023 FINDINGS: New right IJ catheter in place with tip along the central SVC near the right atrium. ET tube, enteric tube, left IJ catheter and left-sided PICC again noted. The PICC has once again the tip folded back on itself. Possibly  along the azygous. This could be repositioned. Underinflation. Stable cardiopericardial silhouette with diffuse parenchymal and interstitial opacities. Small right effusion. No pneumothorax. Overlapping cardiac leads. IMPRESSION: New right IJ catheter in place.  No pneumothorax. Left-sided PICC has catheter tip folded at the tip and could be within the azygous. Recommend repositioning. Electronically Signed   By: Ranell Bring M.D.   On: 11/16/2023 12:13    Medications: Infusions:  DOBUTamine  5 mcg/kg/min (11/17/23 0900)   feeding supplement (VITAL AF 1.2 CAL) 10 mL/hr at 11/17/23 0900   heparin  10,000 units/ 20 mL infusion syringe 500 Units/hr (11/17/23 0110)   norepinephrine  (LEVOPHED ) Adult infusion 4 mcg/min (11/17/23 0900)   piperacillin -tazobactam (ZOSYN )  IV 12.5 mL/hr at 11/17/23 0900   prismasol  BGK 4/2.5 400 mL/hr at 11/17/23 0109   prismasol  BGK 4/2.5 400 mL/hr at  11/17/23 0109   prismasol  BGK 4/2.5 1,500 mL/hr at 11/17/23 9092   propofol  (DIPRIVAN ) infusion 35 mcg/kg/min (11/17/23 0904)   vasopressin  0.03 Units/min (11/17/23 0912)    Scheduled Medications:  aspirin   81 mg Per Tube Daily   atorvastatin   40 mg Per Tube Daily   Chlorhexidine  Gluconate Cloth  6 each Topical Daily   docusate  100 mg Per Tube BID   feeding supplement (PROSource TF20)  60 mL Per Tube Daily   gabapentin   300 mg Per Tube QHS   heparin  injection (subcutaneous)  5,000 Units Subcutaneous Q8H   insulin  aspart  0-15 Units Subcutaneous Q4H   insulin  glargine-yfgn  6 Units Subcutaneous BID   ipratropium-albuterol   3 mL Nebulization Q6H   levothyroxine   150 mcg Per Tube QAC breakfast   linezolid   600 mg Per Tube Q12H   multivitamin  1 tablet Per Tube QHS   nutrition supplement (JUVEN)  1 packet Per Tube BID BM   mouth rinse  15 mL Mouth Rinse Q2H   pantoprazole  (PROTONIX ) IV  40 mg Intravenous QHS   polyethylene glycol  17 g Per Tube BID   senna-docusate  1 tablet Per Tube BID   sodium chloride  flush  10-40 mL Intracatheter Q12H    have reviewed scheduled and prn medications.  Physical Exam: General: Intubated, sedated. Heart:RRR, s1s2 nl Lungs: Coarse breath sound bilateral. Abdomen:soft, Non-tender, non-distended Extremities: Peripheral edema+ Dialysis Access: Temporary HD catheter.  Lynia Landry Prasad Havoc Sanluis 11/17/2023,9:32 AM  LOS: 5 days

## 2023-11-17 NOTE — Procedures (Signed)
 Extubation Procedure Note  Patient Details:   Name: Kayla Schmitt DOB: February 26, 1966 MRN: [id number]   Airway Documentation:    Vent end date: 11/17/23 Vent end time: 1203   Evaluation  O2 sats: stable throughout Complications: No apparent complications Patient did tolerate procedure well. Bilateral Breath Sounds: Diminished, Clear   Yes  Pt extubated per md order to 4 lpm [place]. + cuff leak. No stridor noted.   [name] 11/17/2023, 12:08 PM

## 2023-11-18 DIAGNOSIS — R7989 Other specified abnormal findings of blood chemistry: Secondary | ICD-10-CM

## 2023-11-18 LAB — CBC WITH DIFFERENTIAL/PLATELET
Abs Immature Granulocytes: 0.3 K/uL — ABNORMAL HIGH (ref 0.00–0.07)
Basophils Absolute: 0.1 K/uL (ref 0.0–0.1)
Basophils Relative: 0 %
Eosinophils Absolute: 0.4 K/uL (ref 0.0–0.5)
Eosinophils Relative: 3 %
HCT: 21.3 % — ABNORMAL LOW (ref 36.0–46.0)
Hemoglobin: 6.5 g/dL — CL (ref 12.0–15.0)
Immature Granulocytes: 2 %
Lymphocytes Relative: 12 %
Lymphs Abs: 1.9 K/uL (ref 0.7–4.0)
MCH: 28 pg (ref 26.0–34.0)
MCHC: 30.5 g/dL (ref 30.0–36.0)
MCV: 91.8 fL (ref 80.0–100.0)
Monocytes Absolute: 1.1 K/uL — ABNORMAL HIGH (ref 0.1–1.0)
Monocytes Relative: 7 %
Neutro Abs: 11.5 K/uL — ABNORMAL HIGH (ref 1.7–7.7)
Neutrophils Relative %: 76 %
Platelets: 488 K/uL — ABNORMAL HIGH (ref 150–400)
RBC: 2.32 MIL/uL — ABNORMAL LOW (ref 3.87–5.11)
RDW: 17 % — ABNORMAL HIGH (ref 11.5–15.5)
WBC: 15.2 K/uL — ABNORMAL HIGH (ref 4.0–10.5)
nRBC: 0 % (ref 0.0–0.2)

## 2023-11-18 LAB — CBC
HCT: 23.5 % — ABNORMAL LOW (ref 36.0–46.0)
Hemoglobin: 7.3 g/dL — ABNORMAL LOW (ref 12.0–15.0)
MCH: 28 pg (ref 26.0–34.0)
MCHC: 31.1 g/dL (ref 30.0–36.0)
MCV: 90 fL (ref 80.0–100.0)
Platelets: 498 K/uL — ABNORMAL HIGH (ref 150–400)
RBC: 2.61 MIL/uL — ABNORMAL LOW (ref 3.87–5.11)
RDW: 17.5 % — ABNORMAL HIGH (ref 11.5–15.5)
WBC: 17.2 K/uL — ABNORMAL HIGH (ref 4.0–10.5)
nRBC: 0.3 % — ABNORMAL HIGH (ref 0.0–0.2)

## 2023-11-18 LAB — COOXEMETRY PANEL
Carboxyhemoglobin: 3 % — ABNORMAL HIGH (ref 0.5–1.5)
Carboxyhemoglobin: 3.1 % — ABNORMAL HIGH (ref 0.5–1.5)
Methemoglobin: 1.6 % — ABNORMAL HIGH (ref 0.0–1.5)
Methemoglobin: 0.7 % (ref 0.0–1.5)
O2 Saturation: 57.9 %
O2 Saturation: 69.4 %
Total hemoglobin: <6.9 g/dL — CL (ref 12.0–16.0)
Total hemoglobin: 7.9 g/dL — ABNORMAL LOW (ref 12.0–16.0)

## 2023-11-18 LAB — BASIC METABOLIC PANEL WITH GFR
Anion gap: 10 (ref 5–15)
BUN: 22 mg/dL — ABNORMAL HIGH (ref 6–20)
CO2: 24 mmol/L (ref 22–32)
Calcium: 8.1 mg/dL — ABNORMAL LOW (ref 8.9–10.3)
Chloride: 100 mmol/L (ref 98–111)
Creatinine, Ser: 1.59 mg/dL — ABNORMAL HIGH (ref 0.44–1.00)
GFR, Estimated: 38 mL/min — ABNORMAL LOW (ref >60)
Glucose, Bld: 134 mg/dL — ABNORMAL HIGH (ref 70–99)
Potassium: 4 mmol/L (ref 3.5–5.1)
Sodium: 134 mmol/L — ABNORMAL LOW (ref 135–145)

## 2023-11-18 LAB — GLUCOSE, CAPILLARY
Glucose-Capillary: 123 mg/dL — ABNORMAL HIGH (ref 70–99)
Glucose-Capillary: 139 mg/dL — ABNORMAL HIGH (ref 70–99)
Glucose-Capillary: 145 mg/dL — ABNORMAL HIGH (ref 70–99)
Glucose-Capillary: 152 mg/dL — ABNORMAL HIGH (ref 70–99)
Glucose-Capillary: 108 mg/dL — ABNORMAL HIGH (ref 70–99)

## 2023-11-18 LAB — RENAL FUNCTION PANEL
Albumin: 2 g/dL — ABNORMAL LOW (ref 3.5–5.0)
Anion gap: 9 (ref 5–15)
BUN: 18 mg/dL (ref 6–20)
CO2: 25 mmol/L (ref 22–32)
Calcium: 8.2 mg/dL — ABNORMAL LOW (ref 8.9–10.3)
Chloride: 101 mmol/L (ref 98–111)
Creatinine, Ser: 1.42 mg/dL — ABNORMAL HIGH (ref 0.44–1.00)
GFR, Estimated: 43 mL/min — ABNORMAL LOW (ref >60)
Glucose, Bld: 164 mg/dL — ABNORMAL HIGH (ref 70–99)
Phosphorus: 2.7 mg/dL (ref 2.5–4.6)
Potassium: 4.2 mmol/L (ref 3.5–5.1)
Sodium: 135 mmol/L (ref 135–145)

## 2023-11-18 LAB — MAGNESIUM: Magnesium: 2.4 mg/dL (ref 1.7–2.4)

## 2023-11-18 LAB — APTT: aPTT: 54 s — ABNORMAL HIGH (ref 24–36)

## 2023-11-18 LAB — TROPONIN I (HIGH SENSITIVITY): Troponin I (High Sensitivity): 486 ng/L (ref <18)

## 2023-11-18 LAB — PHOSPHORUS: Phosphorus: 2.6 mg/dL (ref 2.5–4.6)

## 2023-11-18 LAB — PREPARE RBC (CROSSMATCH)

## 2023-11-18 MED ORDER — OXYCODONE HCL 5 MG PO TABS
2.5000 mg | ORAL_TABLET | Freq: Four times a day (QID) | ORAL | Status: DC | PRN
  Administered 2023-11-19 – 2023-11-20 (×2): 2.5 mg via ORAL
  Filled 2023-11-18 (×3): qty 1

## 2023-11-18 MED ORDER — SENNOSIDES-DOCUSATE SODIUM 8.6-50 MG PO TABS: 1.0000 | ORAL_TABLET | Freq: Two times a day (BID) | ORAL | Status: DC

## 2023-11-18 MED ORDER — GABAPENTIN 300 MG PO CAPS
300.0000 mg | ORAL_CAPSULE | Freq: Every day | ORAL | Status: DC
  Administered 2023-11-18 – 2023-12-05 (×18): 300 mg via ORAL
  Filled 2023-11-18 (×18): qty 1

## 2023-11-18 MED ORDER — POLYETHYLENE GLYCOL 3350 17 G PO PACK: 17.0000 g | PACK | Freq: Two times a day (BID) | ORAL | Status: DC

## 2023-11-18 MED ORDER — LINEZOLID 600 MG PO TABS
600.0000 mg | ORAL_TABLET | Freq: Two times a day (BID) | ORAL | Status: AC
  Administered 2023-11-18 – 2023-11-21 (×8): 600 mg via ORAL
  Filled 2023-11-18 (×8): qty 1

## 2023-11-18 MED ORDER — DOCUSATE SODIUM 100 MG PO CAPS
100.0000 mg | ORAL_CAPSULE | Freq: Two times a day (BID) | ORAL | Status: DC
  Administered 2023-11-23 – 2023-11-28 (×7): 100 mg via ORAL
  Filled 2023-11-18 (×13): qty 1

## 2023-11-18 MED ORDER — PIPERACILLIN-TAZOBACTAM 3.375 G IVPB 30 MIN
3.3750 g | Freq: Four times a day (QID) | INTRAVENOUS | Status: DC
  Administered 2023-11-18 – 2023-11-19 (×5): 3.375 g via INTRAVENOUS
  Filled 2023-11-18 (×5): qty 50

## 2023-11-18 MED ORDER — SODIUM CHLORIDE 0.9% IV SOLUTION: Freq: Once | INTRAVENOUS | Status: AC

## 2023-11-18 MED ORDER — RENA-VITE PO TABS
1.0000 | ORAL_TABLET | Freq: Every day | ORAL | Status: DC
  Administered 2023-11-19 – 2023-11-25 (×7): 1 via ORAL
  Filled 2023-11-18 (×7): qty 1

## 2023-11-18 MED ORDER — LEVOTHYROXINE SODIUM 75 MCG PO TABS
150.0000 ug | ORAL_TABLET | Freq: Every day | ORAL | Status: DC
  Administered 2023-11-18 – 2023-12-06 (×18): 150 ug via ORAL
  Filled 2023-11-18 (×18): qty 2

## 2023-11-18 MED ORDER — ATORVASTATIN CALCIUM 40 MG PO TABS
40.0000 mg | ORAL_TABLET | Freq: Every day | ORAL | Status: DC
  Administered 2023-11-18 – 2023-12-06 (×16): 40 mg via ORAL
  Filled 2023-11-18 (×17): qty 1

## 2023-11-18 MED ORDER — ASPIRIN 81 MG PO CHEW
81.0000 mg | CHEWABLE_TABLET | Freq: Every day | ORAL | Status: DC
Start: 2023-11-18 — End: 2023-11-28
  Administered 2023-11-18 – 2023-11-28 (×11): 81 mg via ORAL
  Filled 2023-11-18 (×11): qty 1

## 2023-11-18 MED FILL — Sodium Chloride IV Soln 0.9%: INTRAVENOUS | Qty: 250 | Status: AC

## 2023-11-18 MED FILL — Fentanyl Citrate Preservative Free (PF) Inj 2500 MCG/50ML: INTRAMUSCULAR | Qty: 50 | Status: AC

## 2023-11-18 NOTE — Progress Notes (Signed)
 LA SL PICC removed per protocol per MD order. Manual pressure applied for 3 mins. Vaseline gauze, gauze, and Tegaderm applied over insertion site. No bleeding or swelling noted.

## 2023-11-18 NOTE — TOC Progression Note (Signed)
 Transition of Care [facility]) - Progression Note    Patient Details  Name: Kayla Schmitt MRN: [id number] Date of Birth: [birth year]  Transition of Care [facility]) CM/SW Contact  Justina Delcia Czar, RN Phone Number: [phone] 11/18/2023, 2:39 PM  Clinical Narrative:     Spoke to pt and states he lives at home alone. Gave permission to mother for additional information. CIR following for IP rehab.   Will continue to follow for dc needs.     Barriers to Discharge: Continued Medical Work up  Expected Discharge Plan and Services   Discharge Planning Services: CM Consult Post Acute Care Choice: Home Health                                         Social Determinants of Health (SDOH) Interventions SDOH Screenings   Food Insecurity: No Food Insecurity (11/14/2023)  Housing: Low Risk  (11/14/2023)  Transportation Needs: No Transportation Needs (11/14/2023)  Utilities: Not At Risk (11/14/2023)  Financial Resource Strain: Low Risk  (10/19/2023)   Received from [facility]  Tobacco Use: Medium Risk (11/15/2023)    Readmission Risk Interventions    11/14/2023   10:15 AM 08/14/2022    4:37 PM  Readmission Risk Prevention Plan  Post Dischage Appt  Complete  Medication Screening  Complete  Transportation Screening Complete Complete  Medication Review (RN Care Manager) Complete   HRI or Home Care Consult Complete   Palliative Care Screening Not Applicable   Skilled Nursing Facility Not Applicable

## 2023-11-18 NOTE — Plan of Care (Signed)
  Problem: Clinical Measurements: Goal: Respiratory complications will improve Outcome: Progressing   Problem: Skin Integrity: Goal: Risk for impaired skin integrity will decrease Outcome: Progressing   Problem: Fluid Volume: Goal: Ability to maintain a balanced intake and output will improve Outcome: Progressing   Problem: Nutrition: Goal: Adequate nutrition will be maintained Outcome: Not Progressing

## 2023-11-18 NOTE — Progress Notes (Signed)
 Patient ID: Kayla Schmitt, female   DOB: Sep 11, 1965, 58 y.o.   MRN: [id number] [facility] Progress Note   Assessment/ Plan:   1. Acute kidney Injury: Currently anuric and appears to be hemodynamically mediated in the setting of acute exacerbation of congestive heart failure.  Started on CRRT after refractory to high-dose diuretics along with inotropic support.  Overnight has tolerated CRRT with net ultrafiltration of 3.35 L (net -0.5 L to date).  Maintain current CRRT prescription with net ultrafiltration of 100-150 cc/h. 2.  Acute exacerbation of congestive heart failure/cardiogenic shock: History of congestive heart failure with reduced ejection fraction and decompensation noted during this hospitalization.  Refractory to diuretics in the setting of inotropic/pressor support and started on CRRT for extracorporeal volume unloading.  Pressor requirements essentially fixed with some up titration of Levophed  following administration of medications for pain control.  CVP this morning is 5-6 cm water. 3.  Hyponatremia: Hypervolemic hyponatremia and anticipate will improve with continued ultrafiltration.  Not getting any free water or overtly hypotonic fluids.  Pharmacy to concentrate drips as much as possible. 4.  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  Continue antibiotics per primary service. 5.  Anemia: Secondary to critical illness and prior history of GI bleed.  Ongoing PRBC transfusion at this time with acute hemoglobin drop noted on labs.  Subjective:   Tolerated CRRT overnight with circuit clotting once.  Levophed  dose increased after Robaxin  given this morning with attendant blood pressure drop.   Objective:   BP 97/65   Pulse (!) 109   Temp 98 F (36.7 C) (Oral)   Resp 13   Ht 5' 4 (1.626 m)   Wt 92.5 kg   SpO2 100%   BMI 35.00 kg/m   Intake/Output Summary (Last 24 hours) at  11/18/2023 0749 Last data filed at 11/18/2023 0700 Gross per 24 hour  Intake 1216.84 ml  Output 4567 ml  Net -3350.16 ml   Weight change:   Physical Exam: Gen: Somnolent, appears comfortable resting in bed.  On CRRT CVS: Pulse regular tachycardia, S1 and S2 normal Resp: Diminished breath sounds over bases, poor inspiratory effort Abd: Soft, obese, nontender, bowel sounds normal Ext: Status post left BKA and right forefoot amputation.  1+ edema over upper and lower extremities noted.  Imaging: DG CHEST PORT 1 VIEW Result Date: 11/17/2023 CLINICAL DATA:  [id number] Acute exacerbation of CHF (congestive heart failure) (HCC) [id number] EXAM: PORTABLE CHEST - 1 VIEW COMPARISON:  11/16/2023 FINDINGS: Endotracheal tube, gastric tube, right IJ and left IJ central venous catheters stable in position. Diffuse small heterogenous opacities throughout both lungs perhaps marginally improved since previous exam. Heart size and mediastinal contours are within normal limits. Aortic Atherosclerosis (ICD10-170.0). Suspect small right pleural effusion. Visualized bones unremarkable. IMPRESSION: 1. Marginally improved diffuse bilateral opacities. 2. Small right pleural effusion. Electronically Signed   By: JONETTA Faes M.D.   On: 11/17/2023 10:32   DG CHEST PORT 1 VIEW Result Date: 11/16/2023 CLINICAL DATA:  Line placement EXAM: PORTABLE CHEST 1 VIEW COMPARISON:  X-ray 11/15/2023 FINDINGS: New right IJ catheter in place with tip along the central SVC near the right atrium. ET tube, enteric tube, left IJ catheter and left-sided PICC again noted. The PICC has once again the tip folded back on itself. Possibly along the azygous. This could be repositioned. Underinflation. Stable cardiopericardial silhouette with diffuse parenchymal and interstitial opacities. Small right effusion. No pneumothorax. Overlapping cardiac leads. IMPRESSION:  New right IJ catheter in place.  No pneumothorax. Left-sided PICC has catheter tip folded at the  tip and could be within the azygous. Recommend repositioning. Electronically Signed   By: Ranell Bring M.D.   On: 11/16/2023 12:13    Labs: BMET Recent Labs  Lab 11/15/23 0455 11/15/23 0819 11/15/23 1413 11/16/23 0129 11/16/23 1602 11/17/23 0525 11/17/23 1600 11/18/23 0536  NA 142 139 137 134* 135 135 133* 134*  K 3.9 4.2 4.6 4.3 4.2 4.2 4.6 4.0  CL 103  --  102 98 98 99 98 100  CO2 24  --  22 22 21* 23 21* 24  GLUCOSE 169*  --  221* 302* 213* 166* 214* 134*  BUN 45*  --  48* 53* 51* 30* 29* 22*  CREATININE 2.18*  --  2.54* 2.97* 2.88* 0.79 1.77* 1.59*  CALCIUM  8.4*  --  8.5* 8.3* 8.1* 8.0* 8.4* 8.1*  PHOS  --   --   --  6.0* 5.7* 3.3 2.7 2.6   CBC Recent Labs  Lab 11/13/23 0520 11/14/23 0502 11/15/23 0455 11/15/23 0819 11/15/23 1606 11/16/23 0129 11/18/23 0536  WBC 14.0*   < > 10.3  --  15.3* 14.8* 15.2*  NEUTROABS 10.2*  --   --   --  12.2* 11.3* 11.5*  HGB 9.2*   < > 7.7* 8.2* 7.8* 7.6* 6.5*  HCT 28.8*   < > 25.5* 24.0* 25.9* 25.0* 21.3*  MCV 89.7   < > 91.7  --  92.8 94.0 91.8  PLT 571*   < > 403*  --  531* 536* 488*   < > = values in this interval not displayed.    Medications:     sodium chloride    Intravenous Once   aspirin   81 mg Oral Daily   atorvastatin   40 mg Oral Daily   Chlorhexidine  Gluconate Cloth  6 each Topical Daily   docusate sodium   100 mg Oral BID   gabapentin   300 mg Oral QHS   heparin  injection (subcutaneous)  5,000 Units Subcutaneous Q8H   insulin  aspart  0-15 Units Subcutaneous Q4H   insulin  glargine-yfgn  6 Units Subcutaneous BID   ipratropium-albuterol   3 mL Nebulization Q6H   levothyroxine   150 mcg Oral Q0600   linezolid   600 mg Oral Q12H   multivitamin  1 tablet Per Tube QHS   pantoprazole  (PROTONIX ) IV  40 mg Intravenous QHS   polyethylene glycol  17 g Oral BID   senna-docusate  1 tablet Oral BID   sodium chloride  flush  10-40 mL Intracatheter Q12H    Gordy Blanch, MD 11/18/2023, 7:49 AM '

## 2023-11-18 NOTE — Progress Notes (Addendum)
 Advanced Heart Failure Rounding Note  Cardiologist: Vishnu SHAUNNA Maywood, MD  [facility] Consulting MD: Dr. Cherrie Chief Complaint: Acute on chronic HFpEF / RV Failure Patient Profile   58 y.o. female with history of PAD with prior SFA stents and bilateral TMA, severe 3v CAD, MR/AS, DM II, anemia, HTN, HLD, hypothyroidism.  Admitted in 6/25 with MRSA bactermia and necrotizing fasciitis s/p left BKA. Transferred back to [facility] from rehab d/t acute on chronic CHF with concern for low-output and acute respiratory failure.  Subjective:    7/18: aborted cath for acute respiratory distress; intubated; DBA started 7/19: started on CRRT 7/20: extubated  Coox 57%. CVP 5-6. On DBA 5 + NE 4 (down from 7) + VP 0.03 Net negative 3.35L on CRRT.  Hgb 6.5, receiving 1u RBCs  Sitting in bed. Drowsy, requiring repeated stimulation to keep awake. Mother at bedside.   Objective:    Weight Range: 92.5 kg Body mass index is 35 kg/m.   Vital Signs:   Temp:  [97.7 F (36.5 C)-99 F (37.2 C)] 98.2 F (36.8 C) (07/21 0645) Pulse Rate:  [98-111] 108 (07/21 0645) Resp:  [12-37] 12 (07/21 0645) SpO2:  [93 %-100 %] 100 % (07/21 0645) Arterial Line BP: (85-114)/(46-66) 92/49 (07/21 0645) FiO2 (%):  [40 %] 40 % (07/20 0822) Last BM Date : 11/16/23  Weight change: Filed Weights   11/14/23 0438 11/15/23 0500 11/16/23 0701  Weight: 91.7 kg 92.5 kg 92.5 kg   Intake/Output:  Intake/Output Summary (Last 24 hours) at 11/18/2023 0713 Last data filed at 11/18/2023 0600 Gross per 24 hour  Intake 1216.84 ml  Output 4417 ml  Net -3200.16 ml    Physical Exam    CVP 5-6 General: Chronically-ill appearing. No distress on [place] Cardiac: JVP flat. S1 and S2 present.  Abdomen: Soft, non-tender, non-distended.  Extremities: Warm and dry.  Trace peripheral edema.  Neuro: Drowsy  Telemetry   ST 100-110 (personally reviewed)  Labs    CBC Recent Labs    11/16/23 0129 11/18/23 0536  WBC 14.8* 15.2*   NEUTROABS 11.3* 11.5*  HGB 7.6* 6.5*  HCT 25.0* 21.3*  MCV 94.0 91.8  PLT 536* 488*   Basic Metabolic Panel Recent Labs    92/79/74 0525 11/17/23 1600 11/18/23 0536  NA 135 133* 134*  K 4.2 4.6 4.0  CL 99 98 100  CO2 23 21* 24  GLUCOSE 166* 214* 134*  BUN 30* 29* 22*  CREATININE 0.79 1.77* 1.59*  CALCIUM  8.0* 8.4* 8.1*  MG 2.3  --  2.4  PHOS 3.3 2.7 2.6   Liver Function Tests Recent Labs    11/16/23 1602 11/17/23 1600  ALBUMIN  1.8* 1.9*   BNP (last 3 results) Recent Labs    11/03/23 0112 11/07/23 1457 11/13/23 0521  BNP 793.8* 336.4* 1,626.5*   Medications:    Scheduled Medications:  sodium chloride    Intravenous Once   aspirin   81 mg Oral Daily   atorvastatin   40 mg Oral Daily   Chlorhexidine  Gluconate Cloth  6 each Topical Daily   docusate sodium   100 mg Oral BID   gabapentin   300 mg Oral QHS   heparin  injection (subcutaneous)  5,000 Units Subcutaneous Q8H   insulin  aspart  0-15 Units Subcutaneous Q4H   insulin  glargine-yfgn  6 Units Subcutaneous BID   ipratropium-albuterol   3 mL Nebulization Q6H   levothyroxine   150 mcg Oral Q0600   linezolid   600 mg Oral Q12H   multivitamin  1 tablet Per  Tube QHS   pantoprazole  (PROTONIX ) IV  40 mg Intravenous QHS   polyethylene glycol  17 g Oral BID   senna-docusate  1 tablet Oral BID   sodium chloride  flush  10-40 mL Intracatheter Q12H   Infusions:  DOBUTamine  5 mcg/kg/min (11/18/23 0500)   heparin  10,000 units/ 20 mL infusion syringe 750 Units/hr (11/17/23 2145)   norepinephrine  (LEVOPHED ) Adult infusion 1 mcg/min (11/18/23 0500)   piperacillin -tazobactam (ZOSYN )  IV Stopped (11/18/23 0336)   prismasol  BGK 4/2.5 400 mL/hr at 11/17/23 2229   prismasol  BGK 4/2.5 400 mL/hr at 11/17/23 2229   prismasol  BGK 4/2.5 1,500 mL/hr at 11/18/23 0530   vasopressin  0.03 Units/min (11/18/23 0503)   PRN Medications: acetaminophen , heparin , lidocaine , methocarbamol , mouth rinse, oxyCODONE -acetaminophen , prochlorperazine ,  sodium chloride  flush  Assessment/Plan   Acute on chronic HFpEF>Mixed Shock; Cardiogenic/Septic - TTE 12/24: EF 40-45% - TTE 6/25: EF 50-55% - TEE 6/25: EF 60-65%, RV okay, mild to mod MR, mod to sev AS with mG 27 mmHg and AVA 1.07 cm2 - TTE 11/15/23 LVEF 25% - Taken for [facility] 7/17, however developed flash pulmonary edema. Unable to lie flat for procedure.  - Diuresis attempted, developed acute renal failure. Now on CRRT.  - CVP now 5-6. Will plan to pull even today.  - Coox 58 with hgb 6.5 s/p transfusion. Wean DBA to 2.5 mcg/kg/min - On VP + NE, wean as able - GDMT limited by renal function and hypotension  2. Acute respiratory failure with hypoxia - CXR 7/20 with b/l diffuse airspace opacities, likely pulmonary edema, though given fevers also concern for PNA - Extubated 7/20. Now on [place].     3. CAD - NSTEMI in 4/24. Multivessel CAD. CABG had been recommended but managed medically d/t other active medical issues - continue aspirin  + statin   4. Valvular heart disease - Mild to moderate MR and moderate to severe AS on echo 6/25 - Repeat echo with extremely poor windows, based on exam expect AS moderate-severe - However, she is not a TAVR or SAVR candidate currently so even if she has severe AS it would not change current management   5. MRSA bacteremia Necrotizing fasciitis Leukocytosis - S/p recent left BKA 6/25; S/p TMA in 4/24 - Febrile curve improved - WBC 14.8>15.2, has not peaked yet - remove PICC placed 6/28 - On linezolid  + zosyn    6. PAD - hx SFA stents - On aspirin  + statin. Had been on plavix  but stopped d/t risk of bleeding   7. Anemia - s/p 1 u RBC 7/14 - Hx GI bleed from AVMs, ? D/t aortic valve stenosis - hgb 7.6>6.5 today, receiving 1u RBCs   8. ARF on CKD IIIa - In setting of volume overload/low-output - continue DBA and NE for blood pressure support - CRRT for volume removal, hope for renal recovery as she is not a long term iHD candidate  Length of  Stay: 6  CRITICAL CARE Performed by: [place] Lee  Total critical care time: 12 minutes  -Critical care time was exclusive of separately billable procedures and treating other patients. -Critical care was necessary to treat or prevent imminent or life-threatening deterioration. -Critical care was time spent personally by me on the following activities: development of treatment plan with patient and/or surrogate as well as nursing, discussions with consultants, evaluation of patient's response to treatment, examination of patient, obtaining history from patient or surrogate, ordering and performing treatments and interventions, ordering and review of laboratory studies, ordering and review of radiographic  studies, pulse oximetry and re-evaluation of patient's condition.  [place] Lee, NP  11/18/2023, 7:13 AM  Advanced Heart Failure Team Pager [phone] (M-F; 7a - 5p)  Please contact [facility] for night-coverage after hours (5p -7a ) and weekends on [email or web address]  Patient seen with APP, plan extensively discussed.   Subjective: - very weak on exam; lethargic   Exam: Blood pressure 97/65, pulse (!) 106, temperature 98.2 F (36.8 C), resp. rate 15, height 5' 4 (1.626 m), weight 92.5 kg, SpO2 97%.  GENERAL: chronically ill appearing WF on CRRT Lungs- decreased BS CARDIAC:  5         RRR ABDOMEN: Soft, non-tender, non-distended.  EXTREMITIES: Warm and well perfused.  NEUROLOGIC: No obvious FND  A/P - CVP 5 this morning with mixed venous of 58% , Hgb 6.5, eFick > 2.5 L/min/m2. Will continue CRRT to match I/Os. Can attempt high dose diuretics after CRRT cycle is completed.  - Decrease dobutamine  to 2.5mcg/kg/min; weaning off vasopressin . Repeat COOX this afternoon. Will plan to wean off dobutamine  within the next 24h if she tolerates it.  - At this time not a candidate for TAVR; I do not believe her AS is the etiology for her hemodynamic derangements currently.  - Decrease in Hgb to 6.5  this AM; transfusing 1U PRBC.  - Bedside TTE performed by myself today with LVEF of 25% with possible anterior/anteroseptal/apical hypokinesis although LV is globally down. She had a TEE on 10/24/23 with normal LV function (LV is not completely visible but appears intact).  - Will need ischemic evaluation in the near future. LHC from 4/24 with severe multivessel CAD.   Edgerrin Correia Advanced Heart Failure  CRITICAL CARE Performed by: Ria Commander   Total critical care time: 40 minutes  Critical care time was exclusive of separately billable procedures and treating other patients.  Critical care was necessary to treat or prevent imminent or life-threatening deterioration.  Critical care was time spent personally by me on the following activities: development of treatment plan with patient and/or surrogate as well as nursing, discussions with consultants, evaluation of patient's response to treatment, examination of patient, obtaining history from patient or surrogate, ordering and performing treatments and interventions, ordering and review of laboratory studies, ordering and review of radiographic studies, pulse oximetry and re-evaluation of patient's condition.

## 2023-11-18 NOTE — Progress Notes (Signed)
 Physical Therapy Treatment Patient Details Name: Kayla Schmitt MRN: [id number] DOB: [birth year] Today's Date: 11/18/2023   History of Present Illness 58 y.o female admitted 7/15 from [facility] for SOB, CHF exacerbation. 7/17 attempted RHC but couldn't lie flat with pulmonary edema and decompensation requiring intubation. 7/19 CRRT. 7/20 extubation. PMHx: admission 6/22-7/1 for LLE pain, sepsis, necrotizing fasciitis. 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 CAD, aortic stenosis, HTN, CHF, PAD, bil transmet amputation, HLD, hypothyroidsim, T2DM    PT Comments  Pt lethargic but able to open eyes, converse and follow commands with delay. Pt with limited scooting ability due to fatigue and flexiseal. Pt with foley but noted urine and clotting with RN aware and addressing with linens changed. Will continue to follow and encouraged chair position, UB push up on bed  and LB HEP during day.   HR 111 98% on 6L    If plan is discharge home, recommend the following: A lot of help with walking and/or transfers;A lot of help with bathing/dressing/bathroom;Assistance with cooking/housework;Assist for transportation;Help with stairs or ramp for entrance   Can travel by private vehicle        Equipment Recommendations  None recommended by PT    Recommendations for Other Services       Precautions / Restrictions Precautions Precautions: Fall;Other (comment) Recall of Precautions/Restrictions: Impaired Precaution/Restrictions Comments: R transmet amputation, Lt BKA, CRRT, flexiseal     Mobility  Bed Mobility Overal bed mobility: Needs Assistance Bed Mobility: Rolling, Supine to Sit, Sit to Supine Rolling: Min assist   Supine to sit: HOB elevated, Used rails, Mod assist, +2 for safety/equipment Sit to supine: Mod assist, +2 for physical assistance   General bed mobility comments: min assist to roll bil for pericare, mod +2 to pivot to Rt side of bed and back with assist to elevate trunk and pivot hips. Attempted scooting  along EOB to [facility] with max cues and assist of pad but pt with very limited ability to assist max +2 and pt reporting sacral pain limiting scooting. EOB grossly 7 min prior to fatigue. pt CGA for sitting balance    Transfers                   General transfer comment: unable    Ambulation/Gait                   Stairs             Wheelchair Mobility     Tilt Bed    Modified Rankin (Stroke Patients Only)       Balance Overall balance assessment: Needs assistance Sitting-balance support: No upper extremity supported, Feet unsupported Sitting balance-Leahy Scale: Poor Sitting balance - Comments: min assist with progression to CGA EOB                                    Communication Communication Communication: No apparent difficulties  Cognition Arousal: Lethargic, Suspect due to medications Behavior During Therapy: Flat affect   PT - Cognitive impairments: Difficult to assess                       PT - Cognition Comments: pt oriented to self, year and place. decreased attention with cues to attend to task, slow processing and response Following commands: Impaired Following commands impaired: Follows one step commands with increased time, Follows one step commands inconsistently    Cueing Cueing  Techniques: Verbal cues, Gestural cues  Exercises      General Comments        Pertinent Vitals/Pain Pain Assessment Pain Assessment: 0-10 Pain Score: 5  Pain Location: sacrum from flexiseal Pain Descriptors / Indicators: Aching, Discomfort, Sore Pain Intervention(s): Limited activity within patient's tolerance, Monitored during session, Repositioned    Home Living                          Prior Function            PT Goals (current goals can now be found in the care plan section) Progress towards PT goals: Progressing toward goals    Frequency    Min 2X/week      PT Plan      Co-evaluation               AM-PAC PT 6 Clicks Mobility   Outcome Measure  Help needed turning from your back to your side while in a flat bed without using bedrails?: A Little Help needed moving from lying on your back to sitting on the side of a flat bed without using bedrails?: A Lot Help needed moving to and from a bed to a chair (including a wheelchair)?: Total Help needed standing up from a chair using your arms (e.g., wheelchair or bedside chair)?: Total Help needed to walk in hospital room?: Total Help needed climbing 3-5 steps with a railing? : Total 6 Click Score: 9    End of Session   Activity Tolerance: Patient limited by fatigue Patient left: in bed;with call bell/phone within reach;with nursing/sitter in room;with family/visitor present Nurse Communication: Mobility status;Need for lift equipment PT Visit Diagnosis: Unsteadiness on feet (R26.81);Other abnormalities of gait and mobility (R26.89);Muscle weakness (generalized) (M62.81);Difficulty in walking, not elsewhere classified (R26.2)     Time: 9172-9145 PT Time Calculation (min) (ACUTE ONLY): 27 min  Charges:    $Therapeutic Activity: 23-37 mins PT General Charges $$ ACUTE PT VISIT: 1 Visit                     Lenoard SQUIBB, PT Acute Rehabilitation Services Office: [phone]    Lenoard NOVAK Markeise Mathews 11/18/2023, 10:29 AM

## 2023-11-18 NOTE — Progress Notes (Signed)
 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  Stubborn diuresis so attempted RHC today but unable to lie flat.  CXR showing pulmonary edema, bringing to ICU for inotropes and BIPAP.  Full ROS as below, understandably anxious.  Pertinent  Medical History  CAD Aortic stenosis Class 3 obesity Nec fasc Hypothyroidism PVD DM2 GERD Anemia  Significant Hospital Events: Including procedures, antibiotic start and stop dates in addition to other pertinent events   7/16 admission 7/17 ICU, intubated 7/18 started spiking fever with Tmax 101.5, tachycardic.  X-ray chest still showing bilateral infiltrate but FiO2 is coming down.  Lasix  infusion increased to 30 mg/h 7/19 started on CRRT, tolerating spontaneous breathing trial 7/20 tolerated spontaneous breathing trial, extubated  Interim History / Subjective:  Patient remained afebrile, she was extubated yesterday, tolerated well Overnight complained of low back pain, received Robaxin  and Percocet  Remain on CRRT with net -3.3 L Still requiring vasopressor support with vasopressin  and Levophed  and inotrope with dobutamine   Objective    Blood pressure 97/65, pulse (!) 113, temperature 98.4 F (36.9 C), resp. rate 19, height 5' 4 (1.626 m), weight 92.5 kg, SpO2 100%. CVP:  [3 mmHg-30 mmHg] 6 mmHg      Intake/Output Summary (Last 24 hours) at 11/18/2023 9095 Last data filed at 11/18/2023 0800 Gross per 24 hour  Intake 1208.61 ml  Output 4292 ml  Net -3083.39 ml   Filed Weights   11/14/23 0438 11/15/23 0500 11/16/23 0701  Weight: 91.7 kg 92.5 kg 92.5 kg    Examination: General: Acute on chronically  ill-appearing female, lying on the bed HEENT: [place]/AT, eyes anicteric.  moist mucus membranes Neuro: Opens eyes with vocal stimuli, sleepy, following simple commands Chest: Coarse breath sounds, no wheezes or rhonchi Heart: Regular rate and rhythm, systolic murmur best heard in aortic area Abdomen: Soft, nontender, nondistended, bowel sounds present Extremities: Status post bilateral BKA  Labs and images reviewed  Patient Lines/Drains/Airways Status     Active Line/Drains/Airways     Name Placement date Placement time Site Days   Arterial Line 11/16/23 Right Brachial 11/16/23  1206  Brachial  2   PICC Single Lumen 10/26/23 Left Basilic 46 cm 1 cm 10/26/23  8788  Basilic  23   CVC Triple Lumen 11/14/23 Left Subclavian 11/14/23  1700  -- 4   Hemodialysis Catheter Right Internal jugular Triple lumen Temporary (Non-Tunneled) 11/16/23  1205  Internal jugular  2   Urethral Catheter Andie RN Temperature probe 14 Fr. 11/14/23  1700  Temperature probe  4   Fecal Management System 38 mL 11/16/23  0454  -- 2   Wound 10/22/23 1500 Surgical Open Surgical Incision Other (Comment) Left;Proximal 10/22/23  1500  Other (Comment)  27   Wound 10/29/23 1410 Other (Comment) Thigh Distal;Left;Posterior 10/29/23  1410  Thigh  20        Resolved problem list  Hypervolemic hyponatremia/hyperphosphatemia  Assessment and Plan  Acute on chronic HFrEF with cardiogenic shock  Acute respiratory failure with hypoxia and hypercapnia due to pulmonary edema and possible aspiration pneumonia Multivessel coronary artery disease with recent acute NSTEMI Severe aortic stenosis Recent MRSA bacteremia and 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 Peripheral arterial disease Hypothyroidism AKI on CKD stage IIIa, likely due to cardiorenal syndrome, currently on CRRT Hypervolemic hyponatremia Diabetes type 2, complicated with vasculopathy Hypertension Obesity  Appreciate advanced heart failure team  follow-up Coox dropped down to 57% Remain on dobutamine  at 5 She was successfully extubated yesterday, remained on 6 L nasal cannula oxygen  Continue to remove hold volume via CRRT though her CVP is 5 Still requiring vasopressor support with Levophed  and vasopressin , titrate with MAP goal 65 Continue broad-spectrum antibiotics with Zyvox  and Zosyn  Continue levothyroxine  Will continue with CRRT for today, probably will stop by tomorrow Not ready for GDMT Closely monitor electrolytes Patient was supposed to get CABG and aortic valve replacement, it was postponed due to acute illness Continue insulin  with CBG goal 140-180, follow fingersticks are controlled Diet and exercise counseling provided  Best Practice (right click and Reselect all SmartList Selections daily)   Diet/type: Clear liquid diet, advance as tolerated DVT prophylaxis prophylactic heparin   Pressure ulcer(s): Please see nursing notes GI prophylaxis: PPI Lines: Central line/HD catheter, still needed Foley:  Yes, and it is still needed Code Status:  full code Last date of multidisciplinary goals of care discussion [7/19: updated mother at bedside]  Labs   CBC: Recent Labs  Lab 11/12/23 0405 11/13/23 0520 11/14/23 0502 11/14/23 1752 11/15/23 0455 11/15/23 0819 11/15/23 1606 11/16/23 0129 11/18/23 0536  WBC 11.4* 14.0* 13.2*  --  10.3  --  15.3* 14.8* 15.2*  NEUTROABS 7.7 10.2*  --   --   --   --  12.2* 11.3* 11.5*  HGB 9.1* 9.2* 9.0*   < > 7.7* 8.2* 7.8* 7.6* 6.5*  HCT 29.5* 28.8* 28.9*   < > 25.5* 24.0* 25.9* 25.0* 21.3*  MCV 91.9 89.7 90.3  --  91.7  --  92.8 94.0 91.8  PLT 496* 571* 510*  --  403*  --  531* 536* 488*   < > = values in this interval not displayed.    Basic Metabolic Panel: Recent Labs  Lab 11/14/23 0502 11/14/23 1752 11/15/23 0455 11/15/23 0819 11/16/23 0129 11/16/23 1602 11/17/23 0525 11/17/23 1600 11/18/23 0536  NA 136   < > 142   < > 134* 135 135 133* 134*  K 3.5   < > 3.9    < > 4.3 4.2 4.2 4.6 4.0  CL 98   < > 103   < > 98 98 99 98 100  CO2 23   < > 24   < > 22 21* 23 21* 24  GLUCOSE 178*   < > 169*   < > 302* 213* 166* 214* 134*  BUN 40*   < > 45*   < > 53* 51* 30* 29* 22*  CREATININE 1.56*   < > 2.18*   < > 2.97* 2.88* 0.79 1.77* 1.59*  CALCIUM  8.8*   < > 8.4*   < > 8.3* 8.1* 8.0* 8.4* 8.1*  MG 1.8  --  1.7  --  2.6*  --  2.3  --  2.4  PHOS  --   --   --   --  6.0* 5.7* 3.3 2.7 2.6   < > = values in this interval not displayed.   GFR: Estimated Creatinine Clearance: 43.5 mL/min (A) (by C-G formula based on SCr of 1.59 mg/dL (H)). Recent Labs  Lab 11/12/23 0404 11/12/23 0405 11/12/23 2133 11/13/23 0520 11/14/23 1833 11/15/23 0455 11/15/23 1606 11/16/23 0129 11/18/23 0536  PROCALCITON 0.27  --   --   --  1.36  --   --   --   --   WBC  --    < >  --    < >  --  10.3 15.3* 14.8* 15.2*  LATICACIDVEN  --   --  1.3  --  1.0  --   --   --   --    < > = values in this interval not displayed.    Liver Function Tests: Recent Labs  Lab 11/12/23 1921 11/14/23 1833 11/16/23 1602 11/17/23 1600  AST 24 16  --   --   ALT 19 16  --   --   ALKPHOS 115 120  --   --   BILITOT 0.9 0.7  --   --   PROT 8.0 7.2  --   --   ALBUMIN  2.2* 2.2* 1.8* 1.9*   No results for input(s): LIPASE, AMYLASE in the last 168 hours. No results for input(s): AMMONIA in the last 168 hours.  ABG    Component Value Date/Time   PHART 7.343 (L) 11/15/2023 0819   PCO2ART 45.2 11/15/2023 0819   PO2ART 117 (H) 11/15/2023 0819   HCO3 24.2 11/15/2023 0819   TCO2 25 11/15/2023 0819   ACIDBASEDEF 1.0 11/15/2023 0819   O2SAT 57.9 11/18/2023 0536     Coagulation Profile: Recent Labs  Lab 11/12/23 1921  INR 1.3*    Cardiac Enzymes: No results for input(s): CKTOTAL, CKMB, CKMBINDEX, TROPONINI in the last 168 hours.   HbA1C: Hgb A1c MFr Bld  Date/Time Value Ref Range Status  10/28/2023 07:40 PM 5.5 4.8 - 5.6 % Final    Comment:    (NOTE) Diagnosis of  Diabetes The following HbA1c ranges recommended by the American Diabetes Association (ADA) may be used as an aid in the diagnosis of diabetes mellitus.  Hemoglobin             Suggested A1C NGSP%              Diagnosis  <5.7                   Non Diabetic  5.7-6.4                Pre-Diabetic  >6.4                   Diabetic  <7.0                   Glycemic control for                       adults with diabetes.    08/02/2022 08:48 PM 6.4 (H) 4.8 - 5.6 % Final    Comment:    (NOTE)         Prediabetes: 5.7 - 6.4         Diabetes: >6.4         Glycemic control for adults with diabetes: <7.0     CBG: Recent Labs  Lab 11/17/23 1509 11/17/23 1957 11/17/23 2331 11/18/23 0310 11/18/23 0751  GLUCAP 190* 173* 190* 123* 139*    The patient is critically ill due to acute respiratory failure with hypoxia and hypercapnia/acute on chronic HFpEF with cardiogenic shock.  Critical care was necessary to treat or prevent imminent or life-threatening deterioration.  Critical care was time spent personally by me on the following activities: development of treatment plan with patient and/or surrogate as well as nursing, discussions with consultants,  evaluation of patient's response to treatment, examination of patient, obtaining history from patient or surrogate, ordering and performing treatments and interventions, ordering and review of laboratory studies, ordering and review of radiographic studies, pulse oximetry, re-evaluation of patient's condition and participation in multidisciplinary rounds.   During this encounter critical care time was devoted to patient care services described in this note for 36 minutes.     Valinda Novas, MD [place] [facility] See Amion for pager If no response to pager, please call [phone] until 7pm After 7pm, Please call E-link [phone]

## 2023-11-18 NOTE — Progress Notes (Signed)
 Inpatient Rehab Admissions Coordinator:   Events since last screen noted.  At this time I will pace a rehab consult for re-assessment.    Reche Lowers, PT, DPT Admissions Coordinator [phone] 11/18/23  1:39 PM

## 2023-11-18 NOTE — Progress Notes (Signed)
 OT Cancellation Note  Patient Details Name: Kayla Schmitt MRN: [id number] DOB: 01-Nov-1965   Cancelled Treatment:    Reason Eval/Treat Not Completed: Patient at procedure or test/ unavailable (CRRT in the room)  Ely Molt 11/18/2023, 2:41 PM

## 2023-11-19 ENCOUNTER — Inpatient Hospital Stay (HOSPITAL_COMMUNITY)

## 2023-11-19 DIAGNOSIS — R570 Cardiogenic shock: Secondary | ICD-10-CM

## 2023-11-19 DIAGNOSIS — E1159 Type 2 diabetes mellitus with other circulatory complications: Secondary | ICD-10-CM

## 2023-11-19 DIAGNOSIS — I5023 Acute on chronic systolic (congestive) heart failure: Secondary | ICD-10-CM

## 2023-11-19 LAB — CBC WITH DIFFERENTIAL/PLATELET
Abs Immature Granulocytes: 0.6 K/uL — ABNORMAL HIGH (ref 0.00–0.07)
Basophils Absolute: 0.1 K/uL (ref 0.0–0.1)
Basophils Relative: 0 %
Eosinophils Absolute: 0.6 K/uL — ABNORMAL HIGH (ref 0.0–0.5)
Eosinophils Relative: 3 %
HCT: 23.3 % — ABNORMAL LOW (ref 36.0–46.0)
Hemoglobin: 7.4 g/dL — ABNORMAL LOW (ref 12.0–15.0)
Immature Granulocytes: 3 %
Lymphocytes Relative: 15 %
Lymphs Abs: 2.6 K/uL (ref 0.7–4.0)
MCH: 28.8 pg (ref 26.0–34.0)
MCHC: 31.8 g/dL (ref 30.0–36.0)
MCV: 90.7 fL (ref 80.0–100.0)
Monocytes Absolute: 1.3 K/uL — ABNORMAL HIGH (ref 0.1–1.0)
Monocytes Relative: 7 %
Neutro Abs: 12.4 K/uL — ABNORMAL HIGH (ref 1.7–7.7)
Neutrophils Relative %: 72 %
Platelets: 486 K/uL — ABNORMAL HIGH (ref 150–400)
RBC: 2.57 MIL/uL — ABNORMAL LOW (ref 3.87–5.11)
RDW: 17.6 % — ABNORMAL HIGH (ref 11.5–15.5)
WBC: 17.5 K/uL — ABNORMAL HIGH (ref 4.0–10.5)
nRBC: 0.6 % — ABNORMAL HIGH (ref 0.0–0.2)

## 2023-11-19 LAB — RENAL FUNCTION PANEL
Albumin: 2 g/dL — ABNORMAL LOW (ref 3.5–5.0)
Anion gap: 12 (ref 5–15)
BUN: 14 mg/dL (ref 6–20)
CO2: 22 mmol/L (ref 22–32)
Calcium: 8 mg/dL — ABNORMAL LOW (ref 8.9–10.3)
Chloride: 97 mmol/L — ABNORMAL LOW (ref 98–111)
Creatinine, Ser: 1.35 mg/dL — ABNORMAL HIGH (ref 0.44–1.00)
GFR, Estimated: 46 mL/min — ABNORMAL LOW (ref >60)
Glucose, Bld: 140 mg/dL — ABNORMAL HIGH (ref 70–99)
Phosphorus: 2.3 mg/dL — ABNORMAL LOW (ref 2.5–4.6)
Potassium: 4.1 mmol/L (ref 3.5–5.1)
Sodium: 131 mmol/L — ABNORMAL LOW (ref 135–145)

## 2023-11-19 LAB — COOXEMETRY PANEL
Carboxyhemoglobin: 2.2 % — ABNORMAL HIGH (ref 0.5–1.5)
Carboxyhemoglobin: 1.6 % — ABNORMAL HIGH (ref 0.5–1.5)
Carboxyhemoglobin: 1.4 % (ref 0.5–1.5)
Methemoglobin: <0.7 % (ref 0.0–1.5)
Methemoglobin: <0.7 % (ref 0.0–1.5)
Methemoglobin: <0.7 % (ref 0.0–1.5)
O2 Saturation: 81.7 %
O2 Saturation: 49.8 %
O2 Saturation: 60.8 %
Total hemoglobin: 7.7 g/dL — ABNORMAL LOW (ref 12.0–16.0)
Total hemoglobin: 10.9 g/dL — ABNORMAL LOW (ref 12.0–16.0)
Total hemoglobin: 9.6 g/dL — ABNORMAL LOW (ref 12.0–16.0)

## 2023-11-19 LAB — BASIC METABOLIC PANEL WITH GFR
Anion gap: 13 (ref 5–15)
BUN: 14 mg/dL (ref 6–20)
CO2: 20 mmol/L — ABNORMAL LOW (ref 22–32)
Calcium: 7.9 mg/dL — ABNORMAL LOW (ref 8.9–10.3)
Chloride: 97 mmol/L — ABNORMAL LOW (ref 98–111)
Creatinine, Ser: 1.55 mg/dL — ABNORMAL HIGH (ref 0.44–1.00)
GFR, Estimated: 39 mL/min — ABNORMAL LOW (ref >60)
Glucose, Bld: 167 mg/dL — ABNORMAL HIGH (ref 70–99)
Potassium: 3.7 mmol/L (ref 3.5–5.1)
Sodium: 130 mmol/L — ABNORMAL LOW (ref 135–145)

## 2023-11-19 LAB — GLUCOSE, CAPILLARY
Glucose-Capillary: 131 mg/dL — ABNORMAL HIGH (ref 70–99)
Glucose-Capillary: 140 mg/dL — ABNORMAL HIGH (ref 70–99)
Glucose-Capillary: 103 mg/dL — ABNORMAL HIGH (ref 70–99)
Glucose-Capillary: 165 mg/dL — ABNORMAL HIGH (ref 70–99)
Glucose-Capillary: 143 mg/dL — ABNORMAL HIGH (ref 70–99)
Glucose-Capillary: 116 mg/dL — ABNORMAL HIGH (ref 70–99)
Glucose-Capillary: 146 mg/dL — ABNORMAL HIGH (ref 70–99)
Glucose-Capillary: 162 mg/dL — ABNORMAL HIGH (ref 70–99)

## 2023-11-19 LAB — MAGNESIUM: Magnesium: 2.5 mg/dL — ABNORMAL HIGH (ref 1.7–2.4)

## 2023-11-19 LAB — APTT: aPTT: 60 s — ABNORMAL HIGH (ref 24–36)

## 2023-11-19 LAB — OCCULT BLOOD X 1 CARD TO LAB, STOOL: Fecal Occult Bld: NEGATIVE

## 2023-11-19 MED ORDER — JUVEN PO PACK
1.0000 | PACK | Freq: Two times a day (BID) | ORAL | Status: DC
  Administered 2023-11-19: 1
  Filled 2023-11-19: qty 1

## 2023-11-19 MED ORDER — POLYETHYLENE GLYCOL 3350 17 G PO PACK: 17.0000 g | PACK | Freq: Every day | ORAL | Status: DC | PRN

## 2023-11-19 MED ORDER — JUVEN PO PACK
1.0000 | PACK | Freq: Two times a day (BID) | ORAL | Status: DC
  Administered 2023-11-20 – 2023-11-25 (×10): 1 via ORAL
  Filled 2023-11-19 (×12): qty 1

## 2023-11-19 MED ORDER — PIPERACILLIN-TAZOBACTAM IN DEX 2-0.25 GM/50ML IV SOLN
2.2500 g | Freq: Three times a day (TID) | INTRAVENOUS | Status: AC
  Administered 2023-11-19 – 2023-11-21 (×7): 2.25 g via INTRAVENOUS
  Filled 2023-11-19 (×7): qty 50

## 2023-11-19 MED ORDER — SENNOSIDES-DOCUSATE SODIUM 8.6-50 MG PO TABS: 1.0000 | ORAL_TABLET | Freq: Every evening | ORAL | Status: DC | PRN

## 2023-11-19 MED ORDER — ENSURE PLUS HIGH PROTEIN PO LIQD
237.0000 mL | Freq: Every day | ORAL | Status: DC
  Administered 2023-11-19 – 2023-11-21 (×3): 237 mL via ORAL
  Filled 2023-11-19: qty 237

## 2023-11-19 MED ORDER — PANTOPRAZOLE SODIUM 40 MG PO TBEC
40.0000 mg | DELAYED_RELEASE_TABLET | Freq: Every day | ORAL | Status: DC
  Administered 2023-11-19 – 2023-12-06 (×15): 40 mg via ORAL
  Filled 2023-11-19 (×16): qty 1

## 2023-11-19 MED ORDER — FUROSEMIDE 10 MG/ML IJ SOLN
120.0000 mg | Freq: Once | INTRAVENOUS | Status: AC
  Administered 2023-11-19: 120 mg via INTRAVENOUS
  Filled 2023-11-19: qty 120

## 2023-11-19 MED ORDER — GERHARDT'S BUTT CREAM: TOPICAL_CREAM | CUTANEOUS | Status: DC | PRN

## 2023-11-19 NOTE — Progress Notes (Addendum)
 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  Stubborn diuresis so attempted RHC today but unable to lie flat.  CXR showing pulmonary edema, bringing to ICU for inotropes and BIPAP.  Full ROS as below, understandably anxious.  Pertinent  Medical History  CAD Aortic stenosis Class 3 obesity Nec fasc Hypothyroidism PVD DM2 GERD Anemia  Significant Hospital Events: Including procedures, antibiotic start and stop dates in addition to other pertinent events   7/16 admission 7/17 ICU, intubated 7/18 started spiking fever with Tmax 101.5, tachycardic.  X-ray chest still showing bilateral infiltrate but FiO2 is coming down.  Lasix  infusion increased to 30 mg/h 7/19 started on CRRT, tolerating spontaneous breathing trial 7/20 tolerated spontaneous breathing trial, extubated 7/22 continues to remain on CRRT-plan is to have holiday starting symptom today, still remains on Levophed /dobutamine   Interim History / Subjective:  Patient continues to progress Complains of left elbow pain/soreness-no signs of trauma or wound present Continue CRRT On Levophed /dobutamine   Objective    Blood pressure 97/65, pulse 93, temperature 98.1 F (36.7 C), temperature source Oral, resp. rate 16, height 5' 4 (1.626 m), weight 92 kg, SpO2 96%. CVP:  [0 mmHg-15 mmHg] 6 mmHg      Intake/Output Summary (Last 24 hours) at 11/19/2023 0834 Last data filed at 11/19/2023 0800 Gross per 24 hour  Intake 1186.18 ml  Output 1538.1 ml  Net -351.92 ml   Filed Weights   11/15/23 0500 11/16/23 0701 11/19/23 0600  Weight: 92.5 kg 92.5 kg 92 kg    Examination: General: acute on chronically  ill, sitting up in ICU bed CRRT with no distress  HEENT: Normocephalic, PERRLA intact-2 mm B/L, missing teeth Pink MM CV: s1,s2, RRR, no MRG, No JVD  pulm: clear, diminished, no distress-on 2 L nasal cannula Abs: bs active, soft, currently eating breakfast Extremities: no edema, no deformity, moves all extremities on command  Skin: no rash, bilateral BKA-right stump clean dry and intact, left has dressing wrapping around-unable to assess site Neuro: Rass 0, alert and oriented x 4   GU: Intact  Called patient. Advised patient of provider's approval for requested procedure, as well as any comments/instructions from provider.           Patient Lines/Drains/Airways Status     Active Line/Drains/Airways     Name Placement date Placement time Site Days   Arterial Line 11/16/23 Right Brachial 11/16/23  1206  Brachial  2   PICC Single Lumen 10/26/23 Left Basilic 46 cm 1 cm 10/26/23  8788  Basilic  23   CVC Triple Lumen 11/14/23 Left Subclavian 11/14/23  1700  -- 4   Hemodialysis Catheter Right Internal jugular Triple lumen Temporary (Non-Tunneled) 11/16/23  1205  Internal jugular  2   Urethral Catheter Kayla Schmitt Temperature probe 14 Fr. 11/14/23  1700  Temperature probe  4   Fecal Management System 38 mL 11/16/23  0454  -- 2   Wound 10/22/23 1500 Surgical Open Surgical Incision Other (Comment) Left;Proximal 10/22/23  1500  Other (Comment)  27   Wound 10/29/23 1410 Other (Comment) Thigh Distal;Left;Posterior 10/29/23  1410  Thigh  20        Resolved problem  list  Hypervolemic hyponatremia/hyperphosphatemia  Assessment and Plan  Acute on chronic HFrEF with cardiogenic shock  Acute respiratory failure with hypoxia and hypercapnia due to pulmonary edema and possible aspiration pneumonia-improving  Multivessel coronary artery disease with recent acute NSTEMI Severe aortic stenosis Recent MRSA bacteremia and 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 Peripheral arterial  disease Hypothyroidism AKI on CKD stage IIIa, likely due to cardiorenal syndrome, currently on CRRT Hypervolemic hyponatremia Diabetes type 2, complicated with vasculopathy Hypertension Obesity P:  Continues to remain on dobutamine  and Levophed , Coox 81.7 on 7/22 Advanced heart failure following appreciate assistance Continue to wean O2, O2 sat goal greater than 92%, currently on 2 L nasal cannula Plan is for CRRT holiday later today on 7/22-CR 1.35, BUN 14 Continue antibiotics-linezolid /Zosyn  -Continue to trend WBC/fever curve Will need to be started on GDMT once medically appropriate Continue to monitor electrolytes especially while on CRRT, optimize daily Continue SSI CBG goal 140-180 PT/OT, mobilize as tolerated Continue diet/exercise counseling  Best Practice (right click and Reselect all SmartList Selections daily)   Diet/type: Clear liquid diet, advance as tolerated DVT prophylaxis prophylactic heparin   Pressure ulcer(s): Please see nursing notes GI prophylaxis: PPI Lines: Central line/HD catheter, still needed Foley:  Yes, and it is still needed Code Status:  full code Last date of multidisciplinary goals of care discussion [7/22 updated mother at bedside   Sherlean Sharps AGACNP-BC   [facility] 11/19/2023, 9:29 AM  Please see [email or web address] for pager details.  From 7A-7P if no response, please call [phone]. After hours, please call ELink [phone].     Critical care attending attestation note: I agree with the Advanced Practitioner's note, impression, and recommendations as outlined. I have taken an independent interval history, reviewed the chart and examined the patient. The following reflects my medical decision making and independent critical care time   Synopsis of assessment and plan: 58 year old female with severe aortic stenosis and ischemic cardiomyopathy in the setting of coronary artery disease who presented with acute  respiratory failure with hypoxia and hypercapnia in the setting of severe pulmonary edema and probable aspiration pneumonia  Vasopressor requirement is improving, off vasopressin , currently on Levophed  at 3 mics and dobutamine  at 2.5 Stated feeling better   Physical exam: General: Acute on chronically ill-appearing female, lying on the bed HEENT: [place]/AT, eyes anicteric.  moist mucus membranes Neuro: Alert, awake following commands Chest: Coarse breath sounds, no wheezes or rhonchi Heart: Regular rate and rhythm, no murmurs or gallops Abdomen: Soft, nontender, nondistended, bowel sounds present Extremities: Bilateral BKA  Labs and images were reviewed  Assessment and plan: Acute on chronic HFrEF with cardiogenic shock Acute respiratory failure with hypoxia and hypercapnia due to pulmonary edema and aspiration pneumonia Severe sepsis with septic shock due to aspiration pneumonia, POA Multivessel coronary artery disease with recent NSTEMI Severe aortic stenosis Peripheral arterial disease Recent MRSA bacteremia with 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 AKI on CKD stage IIIa due to cardiorenal syndrome Hypothyroidism Hypervolemic hyponatremia/hypophosphatemia Diabetes type 2 Hypertension Obesity  Continue dobutamine  and Levophed , Coox is 81% this morning, repeat is pending Monitor intake and output Patient was successfully extubated remain on 1 to 2 L nasal cannula oxygen  Continue IV Zosyn  and Zyvox  to complete 7-day therapy Still requiring low-dose Levophed , titrate with MAP of 65 Patient will have CABG in aortic valve replacement at some point Came off of CRRT, avoid nephrotoxic agent Monitor and supplement electrolytes Continue insulin  with CBG goal 140-180  This patient is critically ill with multiple organ system failure which requires frequent high complexity decision making, assessment, support, evaluation, and titration of therapies. This was  completed through the application of advanced monitoring technologies and extensive interpretation of multiple databases.  During this encounter critical care time was devoted to patient care services described in this note for 30 minutes.    Valinda Novas, MD [place] [facility] See Amion for pager If no response to pager, please call [phone] until 7pm After 7pm, Please call E-link [phone]   11/19/2023, 11:47 AM

## 2023-11-19 NOTE — PMR Pre-admission (Shared)
 PMR Admission Coordinator Pre-Admission Assessment  Patient: Kayla Schmitt is an 58 y.o., female MRN: [id number] DOB: [birth year] Height: 5' 4 (162.6 cm) Weight: 92 kg  surance Information HMO: yes    PPO:      PCP:      IPA:      80/20:      OTHER:  PRIMARY: [employer] Managed      Policy#: [id number]      Subscriber: patient CM Name: Corean Loose      Phone#: [phone] ext [phone]     Fax#: [phone]  Pre-Cert#:   f/u with Rudolph Kanaris phone [phone] ext [phone] fax [phone]     Employer:  Benefits:  Phone #: [phone]     Name:  Eff. Date: 01/28/22-04/29/24     Deduct: $3,500 ($2,047.62 met)      Out of Pocket Max: $7,000 ($2,296.29 met)      Life Max: NA CIR: $250 co-pay/admission, then 70% coverage, 30% co-insurance      SNF: 70% coverage, 30% co-insurance Outpatient: 70% coverage     Co-Pay: 30% co-insurance Home Health: 70% coverage      Co-Pay: 30% co-insurance DME: 70%  coverage     Co-Pay: 30% co-insruance Providers: in-network SECONDARY:       Policy#:      Phone#:   Artist:       Phone#:   The Data processing manager" for patients in Inpatient Rehabilitation Facilities with attached "Privacy Act Statement-Health Care Records" was provided and verbally reviewed with: n/a  Emergency Contact Information Contact Information     Name Relation Home Work Mobile   Tomerlin,[name] Mother [phone]  [phone]      Other Contacts   None on File     Current Medical History  Patient Admitting Diagnosis: HF, Respiratory failure  History of Present Illness: Patient is a 58 year old female with PMH of CAD, aortic stenosis, mitral regurg, chronic combined CHF, PAD HTN, HLD, GI bleed, recent hospitalization for necrotizing fasciitis requiring left BKA, after which Patient was discharged to CIR on 7/1  She completed her CIR therapy on 11/12/23 and was being prepared for discharge but developed SOB.  She was treated with IV  Lasix .  Nebulizer therapy.  As the days progressed her renal function worsened and therefore IV Lasix  was on hold.  Patient received some blood transfusion on 7/14 as her hemoglobin dropped to 7.8. On 7/15 patient reports continued to report shortness of breath with saturation dropping down to 80s. Hospitalist service was consulted and Pt. Transferred to [facility] (acute) for further care.  She was ultimately transferred to the ICU and intubated on 11/15/23 and oin 7/18 developed fever with Tmax 101.5, tachycardic. X-ray chest still showing bilateral infiltrate but FiO2 is coming down. Lasix  infusion increased to 30 mg/h. On 11/16/23  Pt. Was started on CRRT for ARF. She was extubated 11/17/23. Pt. Seen by PT/OT and they recommend return to CIR for additional post acute rehab.  Patient's medical record from [facility]  has been reviewed by the rehabilitation admission coordinator and physician.  Past Medical History  Past Medical History:  Diagnosis Date   Anemia    Aortic stenosis    Arthritis    CHF (congestive heart failure) (HCC)    Coronary artery disease    Diabetes mellitus without complication (HCC)    type 2   GERD (gastroesophageal reflux disease)    Heart murmur    History of  blood transfusion    Hypercholesteremia    Hypertension    Hypothyroidism    Mitral regurgitation    Myocardial infarction [facility])    Peripheral vascular disease (HCC)    PONV (postoperative nausea and vomiting)    Thyroid disease     Has the patient had major surgery during 100 days prior to admission? Yes  Family History   family history includes CAD in an other family member; Diabetes in an other family member.  Current Medications  Current Facility-Administered Medications:    acetaminophen  (TYLENOL ) tablet 650 mg, 650 mg, Oral, Q6H PRN, Patel, Pranav M, MD, 650 mg at 11/17/23 1307   aspirin  chewable tablet 81 mg, 81 mg, Oral, Daily, Harold Scholz, MD, 81 mg at  11/19/23 1104   atorvastatin  (LIPITOR) tablet 40 mg, 40 mg, Oral, Daily, Chand, Sudham, MD, 40 mg at 11/19/23 1104   Chlorhexidine  Gluconate Cloth 2 % PADS 6 each, 6 each, Topical, Daily, Howerter, Justin B, DO, 6 each at 11/19/23 0930   DOBUTamine  (DOBUTREX ) infusion 4000 mcg/mL, 2.5 mcg/kg/min, Intravenous, Continuous, Lee, [place], NP, Last Rate: 3.44 mL/hr at 11/19/23 1400, 2.5 mcg/kg/min at 11/19/23 1400   docusate sodium  (COLACE) capsule 100 mg, 100 mg, Oral, BID, Harold Scholz, MD   feeding supplement (ENSURE PLUS HIGH PROTEIN) liquid 237 mL, 237 mL, Oral, Q1400, Harold Scholz, MD   furosemide  (LASIX ) 120 mg in dextrose  5 % 50 mL IVPB, 120 mg, Intravenous, Once, Jama, [place], NP   gabapentin  (NEURONTIN ) capsule 300 mg, 300 mg, Oral, QHS, Chand, Sudham, MD, 300 mg at 11/18/23 2207   heparin  10,000 units/ 20 mL infusion syringe, 500-1,000 Units/hr, CRRT, Continuous, Dolan Mateo Larger, MD, Last Rate: 1.5 mL/hr at 11/18/23 2225, 750 Units/hr at 11/18/23 2225   heparin  injection 5,000 Units, 5,000 Units, Subcutaneous, Q8H, Fairy Frames, MD, 5,000 Units at 11/19/23 9378   insulin  aspart (novoLOG ) injection 0-15 Units, 0-15 Units, Subcutaneous, Q4H, Chand, Sudham, MD, 2 Units at 11/19/23 1303   insulin  glargine-yfgn (SEMGLEE ) injection 6 Units, 6 Units, Subcutaneous, BID, Chand, Scholz, MD, 6 Units at 11/19/23 1102   ipratropium-albuterol  (DUONEB) 0.5-2.5 (3) MG/3ML nebulizer solution 3 mL, 3 mL, Nebulization, Q6H, Paliwal, Aditya, MD, 3 mL at 11/19/23 1435   levothyroxine  (SYNTHROID ) tablet 150 mcg, 150 mcg, Oral, Q0600, Chand, Sudham, MD, 150 mcg at 11/19/23 9380   lidocaine  (LMX) 4 % cream, , Topical, BID PRN, Harold Scholz, MD, 1 Application at 11/17/23 1630   linezolid  (ZYVOX ) tablet 600 mg, 600 mg, Oral, Q12H, Chand, Sudham, MD, 600 mg at 11/19/23 1104   multivitamin (RENA-VIT) tablet 1 tablet, 1 tablet, Oral, QHS, Chand, Sudham, MD   norepinephrine  (LEVOPHED ) 4mg  in (0.016 mg/mL)  premix infusion, 0-40 mcg/min, Intravenous, Titrated, Chand, Sudham, MD, Last Rate: 3.75 mL/hr at 11/19/23 1400, 1 mcg/min at 11/19/23 1400   nutrition supplement (JUVEN) (JUVEN) powder packet 1 packet, 1 packet, Per Tube, BID BM, Harold Scholz, MD   Oral care mouth rinse, 15 mL, Mouth Rinse, PRN, Claudene Toribio BROCKS, MD   oxyCODONE  (Oxy IR/ROXICODONE ) immediate release tablet 2.5 mg, 2.5 mg, Oral, Q6H PRN, Harold Scholz, MD   pantoprazole  (PROTONIX ) EC tablet 40 mg, 40 mg, Oral, Daily, Chand, Sudham, MD, 40 mg at 11/19/23 1104   piperacillin -tazobactam (ZOSYN ) IVPB 2.25 g, 2.25 g, Intravenous, Q8H, Chand, Sudham, MD   polyethylene glycol (MIRALAX  / GLYCOLAX ) packet 17 g, 17 g, Oral, Daily PRN, Harold Scholz, MD   prochlorperazine  (COMPAZINE ) injection 10 mg, 10 mg, Intravenous, Q6H PRN, Tobie,  Pranav M, MD, 10 mg at 11/17/23 2338   senna-docusate (Senokot-S) tablet 1 tablet, 1 tablet, Oral, QHS PRN, Harold Scholz, MD   sodium chloride  flush (NS) 0.9 % injection 10-40 mL, 10-40 mL, Intracatheter, Q12H, Howerter, Justin B, DO, 30 mL at 11/19/23 0930   sodium chloride  flush (NS) 0.9 % injection 10-40 mL, 10-40 mL, Intracatheter, PRN, Howerter, Justin B, DO   vasopressin  (PITRESSIN) 20 Units in 100 mL (0.2 unit/mL) infusion-*FOR SHOCK*, 0-0.03 Units/min, Intravenous, Continuous, Dela Sheela Kieth HERO, MD, Stopped at 11/18/23 1416  Patients Current Diet:  Diet Order             Diet Carb Modified Fluid consistency: Thin; Room service appropriate? Yes with Assist  Diet effective now                   Precautions / Restrictions Precautions Precautions: Fall, Other (comment) Precaution/Restrictions Comments: R transmet amputation, Lt BKA, CRRT, flexiseal Other Brace: Limb guard Restrictions Weight Bearing Restrictions Per Provider Order: No LLE Weight Bearing Per Provider Order: Non weight bearing   Has the patient had 2 or more falls or a fall with injury in the past year? Yes  Prior Activity  Level Community (5-7x/wk): Working and going out daily PTA  Prior Functional Level Self Care: Did the patient need help bathing, dressing, using the toilet or eating? Independent  Indoor Mobility: Did the patient need assistance with walking from room to room (with or without device)? Independent  Stairs: Did the patient need assistance with internal or external stairs (with or without device)? Independent  Functional Cognition: Did the patient need help planning regular tasks such as shopping or remembering to take medications? Independent  Patient Information Are you of Hispanic, Latino/a,or Spanish origin?: A. No, not of Hispanic, Latino/a, or Spanish origin What is your race?: A. White Do you need or want an interpreter to communicate with a doctor or health care staff?: 0. No  Patient's Response To:  Health Literacy and Transportation Is the patient able to respond to health literacy and transportation needs?: Yes Health Literacy - How often do you need to have someone help you when you read instructions, pamphlets, or other written material from your doctor or pharmacy?: Never In the past 12 months, has lack of transportation kept you from medical appointments or from getting medications?: No In the past 12 months, has lack of transportation kept you from meetings, work, or from getting things needed for daily living?: No  Journalist, newspaper / Equipment Home Equipment: Wheelchair - manual, Agricultural consultant (2 wheels), Other (comment), Shower seat, BSC/3in1, Rollator (4 wheels)  Prior Device Use: Indicate devices/aids used by the patient prior to current illness, exacerbation or injury? None of the above  Current Functional Level Cognition  Orientation Level: Oriented X4    Extremity Assessment (includes Sensation/Coordination)  Upper Extremity Assessment: Generalized weakness  Lower Extremity Assessment: Generalized weakness RLE Deficits / Details: R transmet amputation,  hip, knee and ankle ROM WFL LLE Deficits / Details: Lt BKA  grossly functional ROM did not formally assess    ADLs  Overall ADL's : Needs assistance/impaired Eating/Feeding: Set up, Sitting Grooming: Set up, Sitting Upper Body Bathing: Set up, Sitting Lower Body Bathing: Minimal assistance, Sitting/lateral leans Lower Body Bathing Details (indicate cue type and reason): Good ability to wt shift EOB Upper Body Dressing : Set up, Sitting Lower Body Dressing: Minimal assistance, Sitting/lateral leans Lower Body Dressing Details (indicate cue type and reason): Wt shifting or hip  bridging Statistician: Squat-pivot, +2 for safety/equipment, Minimal assistance, BSC/3in1 Toilet Transfer Details (indicate cue type and reason): Min assist for squat pivot to BSC, +2 for safety without a drop arm Toileting- Clothing Manipulation and Hygiene: Contact guard assist, Sitting/lateral lean Functional mobility during ADLs: Minimal assistance General ADL Comments: Good use of compensatory strategies    Mobility  Overal bed mobility: Needs Assistance Bed Mobility: Rolling, Supine to Sit, Sit to Supine Rolling: Min assist Supine to sit: HOB elevated, Used rails, Mod assist, +2 for safety/equipment Sit to supine: Mod assist, +2 for physical assistance General bed mobility comments: min assist to roll bil for pericare, mod +2 to pivot to Rt side of bed and back with assist to elevate trunk and pivot hips. Attempted scooting along EOB to [facility] with max cues and assist of pad but pt with very limited ability to assist max +2 and pt reporting sacral pain limiting scooting. EOB grossly 7 min prior to fatigue. pt CGA for sitting balance    Transfers  Overall transfer level: Needs assistance Equipment used: Rolling walker (2 wheels), 2 person hand held assist Transfers: Sit to/from Stand, Bed to chair/wheelchair/BSC Sit to Stand: Min assist Bed to/from chair/wheelchair/BSC transfer type:: Squat pivot Squat pivot  transfers: Min assist, +2 safety/equipment General transfer comment: unable    Ambulation / Gait / Stairs / Engineer, drilling / Balance Dynamic Sitting Balance Sitting balance - Comments: min assist with progression to CGA EOB Balance Overall balance assessment: Needs assistance Sitting-balance support: No upper extremity supported, Feet unsupported Sitting balance-Leahy Scale: Poor Sitting balance - Comments: min assist with progression to CGA EOB Standing balance support: Bilateral upper extremity supported, Reliant on assistive device for balance, During functional activity Standing balance-Leahy Scale: Poor Standing balance comment: Reliant on RW    Special needs/care consideration Skin *** and Special service needs ***   Previous Home Environment (from acute therapy documentation) Living Arrangements: Alone Available Help at Discharge: Family, Available 24 hours/day Type of Home: House Home Layout: One level Home Access: Ramped entrance Bathroom Shower/Tub: Walk-in shower, Curtain Bathroom Toilet: Handicapped height Bathroom Accessibility: Yes How Accessible: Accessible via wheelchair, Accessible via walker Home Care Services: No Additional Comments: Pt recently d/c from CIR, reporting preference to return  Discharge Living Setting Plans for Discharge Living Setting: Patient's home Type of Home at Discharge: House Discharge Home Layout: One level Discharge Home Access: Ramped entrance Discharge Bathroom Shower/Tub: Walk-in shower Discharge Bathroom Toilet: Handicapped height Discharge Bathroom Accessibility: Yes How Accessible: Accessible via walker, Accessible via wheelchair Does the patient have any problems obtaining your medications?: No  Social/Family/Support Systems Patient Roles: Other (Comment) Anticipated Caregiver: Mother Anticipated Caregiver's Contact Information: [phone] Ability/Limitations of Caregiver: Mother in good health, can  do min-mod A Caregiver Availability: 24/7 Discharge Plan Discussed with Primary Caregiver: Yes Is Caregiver In Agreement with Plan?: Yes Does Caregiver/Family have Issues with Lodging/Transportation while Pt is in Rehab?: No  Goals Patient/Family Goal for Rehab: Supervision PT/OT Expected length of stay: 10-12 days Pt/Family Agrees to Admission and willing to participate: Yes Program Orientation Provided & Reviewed with Pt/Caregiver Including Roles  & Responsibilities: Yes  Decrease burden of Care through IP rehab admission: Not anticipated   Possible need for SNF placement upon discharge: Not anticipated  Patient Condition: I have reviewed medical records from [facility] , spoken with CM, and patient and spouse. I met with patient at the bedside for inpatient rehabilitation assessment.  Patient will  benefit from ongoing PT and OT, can actively participate in 3 hours of therapy a day 5 days of the week, and can make measurable gains during the admission.  Patient will also benefit from the coordinated team approach during an Inpatient Acute Rehabilitation admission.  The patient will receive intensive therapy as well as Rehabilitation physician, nursing, social worker, and care management interventions.  Due to safety, skin/wound care, disease management, medication administration, pain management, and patient education the patient requires 24 hour a day rehabilitation nursing.  The patient is currently *** with mobility and basic ADLs.  Discharge setting and therapy post discharge at home with home health is anticipated.  Patient has agreed to participate in the Acute Inpatient Rehabilitation Program and will admit {Time; today/tomorrow:10263}.  Preadmission Screen Completed By:  Leita KATHEE Kleine, 11/19/2023 3:23 PM ______________________________________________________________________   Discussed status with Dr. PIERRETTE on *** at *** and received approval for admission  today.  Admission Coordinator:  Leita KATHEE Kleine, CCC-SLP, time PIERRETTEPattricia ***   Assessment/Plan: Diagnosis: *** Does the need for close, 24 hr/day Medical supervision in concert with the patient's rehab needs make it unreasonable for this patient to be served in a less intensive setting? {yes_no_potentially:3041433} Co-Morbidities requiring supervision/potential complications: *** Due to {due un:6958565}, does the patient require 24 hr/day rehab nursing? {yes_no_potentially:3041433} Does the patient require coordinated care of a physician, rehab nurse, PT, OT, and SLP to address physical and functional deficits in the context of the above medical diagnosis(es)? {yes_no_potentially:3041433} Addressing deficits in the following areas: {deficits:3041436} Can the patient actively participate in an intensive therapy program of at least 3 hrs of therapy 5 days a week? {yes_no_potentially:3041433} The potential for patient to make measurable gains while on inpatient rehab is {potential:3041437} Anticipated functional outcomes upon discharge from inpatient rehab: {functional outcomes:304600100} PT, {functional outcomes:304600100} OT, {functional outcomes:304600100} SLP Estimated rehab length of stay to reach the above functional goals is: *** Anticipated discharge destination: {anticipated dc setting:21604} 10. Overall Rehab/Functional Prognosis: {potential:3041437}   MD Signature: ***

## 2023-11-19 NOTE — Progress Notes (Signed)
 Inpatient Rehab Admissions Coordinator:    I spoke with Pt. And her mother regarding potential return to CIR once medically stable. Pt. States she isn't sure if she would want to do CIR again or d/c directly home. I will follow up with her tomorrow and see what she has decided.   Leita Kleine, MS, CCC-SLP Rehab Admissions Coordinator  [phone] (celll) [phone] (office)

## 2023-11-19 NOTE — Progress Notes (Signed)
 Advanced Heart Failure Rounding Note  Cardiologist: Vishnu SHAUNNA Maywood, MD  AHF Consulting MD: Dr. Cherrie Chief Complaint: Acute on chronic HFpEF / RV Failure Patient Profile   58 y.o. female with history of PAD with prior SFA stents and bilateral TMA, severe 3v CAD, MR/AS, DM II, anemia, HTN, HLD, hypothyroidism.  Admitted in 6/25 with MRSA bactermia and necrotizing fasciitis s/p left BKA. Transferred back to [facility] from rehab d/t acute on chronic CHF with concern for low-output and acute respiratory failure.  Subjective:    7/18: aborted cath for acute respiratory distress; intubated; DBA started 7/19: started on CRRT 7/20: extubated 7/21: hgb 6.5 s/p 1u RBCs 7/22: CRRT holiday today  Coox 82%. CVP 6. On DBA 2.5 + NE 4. Net negative 420cc on CRRT WBC 15.2>17.5 on Linezolid /Zosyn   Sitting up in bed. Awake. Feeling well this morning. Eating breakfast. No SOB, abdominal pain.   Objective:    Weight Range: 92 kg Body mass index is 34.81 kg/m.   Vital Signs:   Temp:  [97.2 F (36.2 C)-98.6 F (37 C)] 98.1 F (36.7 C) (07/22 0757) Pulse Rate:  [92-113] 93 (07/22 0700) Resp:  [14-31] 16 (07/22 0700) SpO2:  [95 %-100 %] 96 % (07/22 0700) Arterial Line BP: (94-122)/(48-74) 104/54 (07/22 0700) Weight:  [92 kg] 92 kg (07/22 0600) Last BM Date : 11/18/23  Weight change: Filed Weights   11/15/23 0500 11/16/23 0701 11/19/23 0600  Weight: 92.5 kg 92.5 kg 92 kg   Intake/Output:  Intake/Output Summary (Last 24 hours) at 11/19/2023 0759 Last data filed at 11/19/2023 0700 Gross per 24 hour  Intake 1302.62 ml  Output 1723.4 ml  Net -420.78 ml    Physical Exam    CVP 6 General: Chronically-ill appearing.  Cardiac: JVP flat. S1 and S2 present. No murmurs or rub. Resp: Lung sounds clear and equal B/L Abdomen: Soft, non-tender, non-distended.  Extremities: Warm and dry.  No peripheral edema.  Neuro: Alert and oriented x3. Affect pleasant.   Telemetry   SR in 90s  (personally reviewed)  Labs    CBC Recent Labs    11/18/23 0536 11/18/23 1417 11/19/23 0304  WBC 15.2* 17.2* 17.5*  NEUTROABS 11.5*  --  12.4*  HGB 6.5* 7.3* 7.4*  HCT 21.3* 23.5* 23.3*  MCV 91.8 90.0 90.7  PLT 488* 498* 486*   Basic Metabolic Panel Recent Labs    92/78/74 0536 11/18/23 1457 11/19/23 0304  NA 134* 135 131*  K 4.0 4.2 4.1  CL 100 101 97*  CO2 24 25 22   GLUCOSE 134* 164* 140*  BUN 22* 18 14  CREATININE 1.59* 1.42* 1.35*  CALCIUM  8.1* 8.2* 8.0*  MG 2.4  --  2.5*  PHOS 2.6 2.7 2.3*   Liver Function Tests Recent Labs    11/18/23 1457 11/19/23 0304  ALBUMIN  2.0* 2.0*   BNP (last 3 results) Recent Labs    11/03/23 0112 11/07/23 1457 11/13/23 0521  BNP 793.8* 336.4* 1,626.5*   Medications:    Scheduled Medications:  aspirin   81 mg Oral Daily   atorvastatin   40 mg Oral Daily   Chlorhexidine  Gluconate Cloth  6 each Topical Daily   docusate sodium   100 mg Oral BID   gabapentin   300 mg Oral QHS   heparin  injection (subcutaneous)  5,000 Units Subcutaneous Q8H   insulin  aspart  0-15 Units Subcutaneous Q4H   insulin  glargine-yfgn  6 Units Subcutaneous BID   ipratropium-albuterol   3 mL Nebulization Q6H   levothyroxine   150 mcg Oral Q0600   linezolid   600 mg Oral Q12H   multivitamin  1 tablet Oral QHS   pantoprazole  (PROTONIX ) IV  40 mg Intravenous QHS   polyethylene glycol  17 g Oral BID   senna-docusate  1 tablet Oral BID   sodium chloride  flush  10-40 mL Intracatheter Q12H   Infusions:  DOBUTamine  2.5 mcg/kg/min (11/19/23 0700)   heparin  10,000 units/ 20 mL infusion syringe 750 Units/hr (11/18/23 2225)   norepinephrine  (LEVOPHED ) Adult infusion 4 mcg/min (11/19/23 0700)   piperacillin -tazobactam Stopped (11/19/23 0235)   prismasol  BGK 4/2.5 400 mL/hr at 11/19/23 0300   prismasol  BGK 4/2.5 400 mL/hr at 11/19/23 0300   prismasol  BGK 4/2.5 1,500 mL/hr at 11/19/23 0429   vasopressin  Stopped (11/18/23 1416)   PRN  Medications: acetaminophen , heparin , lidocaine , mouth rinse, oxyCODONE , prochlorperazine , sodium chloride  flush  Assessment/Plan   Acute on chronic HFpEF>Mixed Shock; Cardiogenic/Septic - TTE 12/24: EF 40-45% - TTE 6/25: EF 50-55% - TEE 6/25: EF 60-65%, RV okay, mild to mod MR, mod to sev AS with mG 27 mmHg and AVA 1.07 cm2 - TTE 11/15/23 LVEF 25% - Taken for [facility] 7/17, however developed flash pulmonary edema. Unable to lie flat for procedure.  - CVP 6. Volume removed with CRRT. Net even yesterday. CRRT holiday today, follow UOP and Cr.  - If no UOP by this afternoon will give IV Lasix  - Coox 82 (send repeat) hgb 7.4. On DBA 2.5 + NE 4. Off VP. Stop DBA if repeat co-ox similar.  - GDMT limited by renal function and hypotension  2. Acute respiratory failure with hypoxia - CXR 7/20 with b/l diffuse airspace opacities, likely pulmonary edema, though given fevers also concern for PNA - Extubated 7/20. Now on [place].     3. CAD - NSTEMI in 4/24. Multivessel CAD. CABG had been recommended but managed medically d/t other active medical issues - hs-trop elevated 486 yesterday. Further ischemic eval pending renal function.  - continue aspirin  + statin   4. Valvular heart disease - Mild to moderate MR and moderate to severe AS on echo 6/25 - Repeat echo 7/25 with extremely poor windows, based on exam expect AS moderate-severe - However, she is not a TAVR or SAVR candidate currently so even if she has severe AS it would not change current management   5. MRSA bacteremia Necrotizing fasciitis - S/p recent left BKA 6/25; S/p TMA in 4/24  6. Leukocytosis - WBC14.8>15.2>17.5, not peaked - remove PICC placed 6/28 - On linezolid  + zosyn  - PICC line, foley, FMS out today;  - afebrile on CRRT, could be masking - KUB today; if colonic distention, check C.diff  7. PAD - hx SFA stents - On aspirin  + statin. Had been on plavix  but stopped d/t risk of bleeding.   8. Anemia - s/p 1 u RBC 7/14 - Hx  GI bleed from AVMs, ? D/t aortic valve stenosis - hgb 6.5>1u RBC>7.4 - check occult   9. ARF on CKD IIIa - In setting of volume overload/low-output; volume now improved. - CRRT holiday today, follow UOP and afternoon BMP - Will allow to accumulate this morning; attempt diuretics this afternoon of no UOP - hope for renal recovery as she is not a long term iHD candidate  Length of Stay: 7  CRITICAL CARE Performed by: [place] Ivin Rosenbloom  Total critical care time: 10 minutes  -Critical care time was exclusive of separately billable procedures and treating other patients. -Critical care was necessary to treat or  prevent imminent or life-threatening deterioration. -Critical care was time spent personally by me on the following activities: development of treatment plan with patient and/or surrogate as well as nursing, discussions with consultants, evaluation of patient's response to treatment, examination of patient, obtaining history from patient or surrogate, ordering and performing treatments and interventions, ordering and review of laboratory studies, ordering and review of radiographic studies, pulse oximetry and re-evaluation of patient's condition.  [place] Owens Hara, NP  11/19/2023, 7:59 AM  Advanced Heart Failure Team Pager [phone] (M-F; 7a - 5p)  Please contact [facility] for night-coverage after hours (5p -7a ) and weekends on [email or web address]

## 2023-11-19 NOTE — Progress Notes (Signed)
 Nutrition Follow-up  DOCUMENTATION CODES:   Not applicable  INTERVENTION:   Diet advanced to Carb Modified Diet per MD. Consider liberalizing diet on follow-up if po intake not adequate  Add Chocolate Ensure Plus High Protein po daily at bedtime, each supplement provides 350 kcal and 20 grams of protein  Resume Juven BID, each packet provides 80 calories, 8 grams of carbohydrate, 2.5  grams of protein (collagen), 7 grams of L-arginine and 7 grams of L-glutamine; supplement contains CaHMB, Vitamins C, E, B12 and Zinc  to promote wound healing  Continue Renal MVI for now  Recommend discontinuing scheduled bowel regimen with continued prn regimen  NUTRITION DIAGNOSIS:   Inadequate oral intake related to acute illness as evidenced by NPO status.  Being addressed via TF   GOAL:   Patient will meet greater than or equal to 90% of their needs  Progressing  MONITOR:   Vent status, Labs, Weight trends, TF tolerance, Skin  REASON FOR ASSESSMENT:   Consult, Ventilator Enteral/tube feeding initiation and management  ASSESSMENT:   58 yo female admitted with acute on chronic diastolic HF, cardigenic shock, AKI, moderate MR, moderate to severe aortic stenosis, MRSA bacteremia and acute respiratory failure requiring ventilation. 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  7/15 Admitted from CIR 7/17 Transferred to ICU, placed on BiPap and inotrope support, later intubated 7/18 TF initiated 7/19 TF at 30 ml/hr but held due to emesis, later restarted at 10 ml/hr 7/20 Extubated 7/21 CL diet, minimal intake with poor mentation  Off CRRT today, monitoring for 24 hours and assessing for further RRT tomorrow (iHD vs CRRT vs nothing). Per MD notes, pt is not candidate for long term iHD  Levophed  at 4, Dobutamine  at 2.5  Pt much more alert today. Tolerating CL this AM without difficulty. Pt reports no nausea  this AM, states tummy hurts a little but pt believes this is from hunger pains. Pt reports she is hungry and asking for diet advancement. Discussed with PCCM, ok with diet advancement.  Abdominal xray this AM with nonobstructive bowel gas pattern, noted previous episodes of emesis.   Pt previously taking Juven and Ensure supplements and reports liking them both.  Current wt 92 kg; wts appear relatively stable.   Current UOP not accurate with 3 umeasured urine occurrences in 24 hours  FMS has been removed, noted 3 stool occurrences charted overnight, 2 stools thus far today. Fecal occult negative  Lab Results  Component Value Date   HGBA1C 5.5 10/28/2023    Labs: BUN wdl Creatinine 1.55 (H) Sodium 130 (L) CBGs 103-165  Meds:  Rena-vite Protonix  IV zozyn SS novolog     Diet Order:   Diet Order             Diet Carb Modified Fluid consistency: Thin; Room service appropriate? Yes with Assist  Diet effective now                   EDUCATION NEEDS:   Not appropriate for education at this time  Skin:  Skin Integrity Issues:: Incisions Incisions: Recent L BKA, stitches still in place  Last BM:  7/22 loose stools, FMS removed  Height:   Ht Readings from Last 1 Encounters:  11/12/23 5' 4 (1.626 m)    Weight:   Wt Readings from Last 1 Encounters:  11/19/23 92 kg    BMI:  Body mass index is  34.81 kg/m.  Estimated Nutritional Needs:   Kcal:  1800-2000 kcals  Protein:  105-115 g  Fluid:  1.5L   Betsey Finger MS, RDN, LDN, CNSC Registered Dietitian 3 Clinical Nutrition RD Inpatient Contact Info in Amion

## 2023-11-19 NOTE — Progress Notes (Signed)
 Patient ID: Kayla Schmitt, female   DOB: February 16, 1966, 58 y.o.   MRN: [id number]  [facility] Progress Note   Assessment/ Plan:   1. Acute kidney Injury: Currently appears anuric (versus oliguric based on difficulty in urine output collection) and appears to be hemodynamically mediated in the setting of acute exacerbation of congestive heart failure.  Started on CRRT after refractory to high-dose diuretics along with inotropic support.  Overnight has tolerated CRRT with net ultrafiltration of 0.4 L as prescribed to try and keep her net even based on her CVP.  CVP this morning again is 6 cm water.  Would elect for stopping CRRT today and monitoring her off of it for the next 24 hours based on permissive volume/labs.  This may allow us  to discontinue Levophed  as well and assess candidacy for intermittent dialysis. 2.  Acute exacerbation of congestive heart failure/cardiogenic shock: History of congestive heart failure with reduced ejection fraction and decompensation noted during this hospitalization.  Refractory to diuretics in the setting of inotropic/pressor support and started on CRRT for extracorporeal volume unloading.  CVP this a.m. is 6 cm water and she was essentially net even overnight without much input noted.  May be able to wean off of dobutamine  today (and possibly Levophed  if CRRT stopped). 3.  Hyponatremia: Hypervolemic hyponatremia and anticipate will improve with continued ultrafiltration.  Not getting any free water or overtly hypotonic fluids.  Will monitor lab trends. 4.  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  Continue antibiotics per primary service. 5.  Anemia: Secondary to critical illness and prior history of GI bleed.  Underwent PRBC transfusion with appropriate improvement of hemoglobin thereafter.  Subjective:   Without acute events overnight.  Foley catheter discontinued yesterday  affecting accurate quantification of urine output.   Objective:   BP 97/65   Pulse 93   Temp 98 F (36.7 C)   Resp 16   Ht 5' 4 (1.626 m)   Wt 92 kg   SpO2 96%   BMI 34.81 kg/m   Intake/Output Summary (Last 24 hours) at 11/19/2023 0733 Last data filed at 11/19/2023 0700 Gross per 24 hour  Intake 1302.62 ml  Output 1723.4 ml  Net -420.78 ml   Weight change:   Physical Exam: Gen: Comfortably resting in bed, on CRRT.  Awake and engages in appropriate conversation.  Mother at bedside CVS: Pulse regular rhythm, normal rate S1 and S2 normal Resp: Diminished breath sounds over bases, poor inspiratory effort Abd: Soft, obese, nontender, bowel sounds normal Ext: Status post left BKA and right forefoot amputation.  Trace edema over dependent areas..  Imaging: DG CHEST PORT 1 VIEW Result Date: 11/17/2023 CLINICAL DATA:  [id number] Acute exacerbation of CHF (congestive heart failure) (HCC) [id number] EXAM: PORTABLE CHEST - 1 VIEW COMPARISON:  11/16/2023 FINDINGS: Endotracheal tube, gastric tube, right IJ and left IJ central venous catheters stable in position. Diffuse small heterogenous opacities throughout both lungs perhaps marginally improved since previous exam. Heart size and mediastinal contours are within normal limits. Aortic Atherosclerosis (ICD10-170.0). Suspect small right pleural effusion. Visualized bones unremarkable. IMPRESSION: 1. Marginally improved diffuse bilateral opacities. 2. Small right pleural effusion. Electronically Signed   By: JONETTA Faes M.D.   On: 11/17/2023 10:32    Labs: BMET Recent Labs  Lab 11/16/23 0129 11/16/23 1602 11/17/23 0525 11/17/23 1600 11/18/23 0536 11/18/23 1457 11/19/23 0304  NA 134* 135 135 133* 134* 135 131*  K 4.3 4.2 4.2  4.6 4.0 4.2 4.1  CL 98 98 99 98 100 101 97*  CO2 22 21* 23 21* 24 25 22   GLUCOSE 302* 213* 166* 214* 134* 164* 140*  BUN 53* 51* 30* 29* 22* 18 14  CREATININE 2.97* 2.88* 0.79 1.77* 1.59* 1.42* 1.35*  CALCIUM  8.3* 8.1*  8.0* 8.4* 8.1* 8.2* 8.0*  PHOS 6.0* 5.7* 3.3 2.7 2.6 2.7 2.3*   CBC Recent Labs  Lab 11/15/23 1606 11/16/23 0129 11/18/23 0536 11/18/23 1417 11/19/23 0304  WBC 15.3* 14.8* 15.2* 17.2* 17.5*  NEUTROABS 12.2* 11.3* 11.5*  --  12.4*  HGB 7.8* 7.6* 6.5* 7.3* 7.4*  HCT 25.9* 25.0* 21.3* 23.5* 23.3*  MCV 92.8 94.0 91.8 90.0 90.7  PLT 531* 536* 488* 498* 486*    Medications:     aspirin   81 mg Oral Daily   atorvastatin   40 mg Oral Daily   Chlorhexidine  Gluconate Cloth  6 each Topical Daily   docusate sodium   100 mg Oral BID   gabapentin   300 mg Oral QHS   heparin  injection (subcutaneous)  5,000 Units Subcutaneous Q8H   insulin  aspart  0-15 Units Subcutaneous Q4H   insulin  glargine-yfgn  6 Units Subcutaneous BID   ipratropium-albuterol   3 mL Nebulization Q6H   levothyroxine   150 mcg Oral Q0600   linezolid   600 mg Oral Q12H   multivitamin  1 tablet Oral QHS   pantoprazole  (PROTONIX ) IV  40 mg Intravenous QHS   polyethylene glycol  17 g Oral BID   senna-docusate  1 tablet Oral BID   sodium chloride  flush  10-40 mL Intracatheter Q12H    Gordy Blanch, MD 11/19/2023, 7:33 AM '

## 2023-11-20 ENCOUNTER — Inpatient Hospital Stay (HOSPITAL_COMMUNITY)

## 2023-11-20 DIAGNOSIS — I5033 Acute on chronic diastolic (congestive) heart failure: Secondary | ICD-10-CM

## 2023-11-20 LAB — CBC WITH DIFFERENTIAL/PLATELET
Abs Immature Granulocytes: 0.34 K/uL — ABNORMAL HIGH (ref 0.00–0.07)
Basophils Absolute: 0 K/uL (ref 0.0–0.1)
Basophils Relative: 0 %
Eosinophils Absolute: 0.5 K/uL (ref 0.0–0.5)
Eosinophils Relative: 4 %
HCT: 22.8 % — ABNORMAL LOW (ref 36.0–46.0)
Hemoglobin: 6.9 g/dL — CL (ref 12.0–15.0)
Immature Granulocytes: 3 %
Lymphocytes Relative: 21 %
Lymphs Abs: 2.8 K/uL (ref 0.7–4.0)
MCH: 27.8 pg (ref 26.0–34.0)
MCHC: 30.3 g/dL (ref 30.0–36.0)
MCV: 91.9 fL (ref 80.0–100.0)
Monocytes Absolute: 1 K/uL (ref 0.1–1.0)
Monocytes Relative: 8 %
Neutro Abs: 8.5 K/uL — ABNORMAL HIGH (ref 1.7–7.7)
Neutrophils Relative %: 64 %
Platelets: 357 K/uL (ref 150–400)
RBC: 2.48 MIL/uL — ABNORMAL LOW (ref 3.87–5.11)
RDW: 17.9 % — ABNORMAL HIGH (ref 11.5–15.5)
WBC: 13.1 K/uL — ABNORMAL HIGH (ref 4.0–10.5)
nRBC: 0.4 % — ABNORMAL HIGH (ref 0.0–0.2)

## 2023-11-20 LAB — CBC
HCT: 26.1 % — ABNORMAL LOW (ref 36.0–46.0)
Hemoglobin: 8.3 g/dL — ABNORMAL LOW (ref 12.0–15.0)
MCH: 28.3 pg (ref 26.0–34.0)
MCHC: 31.8 g/dL (ref 30.0–36.0)
MCV: 89.1 fL (ref 80.0–100.0)
Platelets: 321 K/uL (ref 150–400)
RBC: 2.93 MIL/uL — ABNORMAL LOW (ref 3.87–5.11)
RDW: 17.7 % — ABNORMAL HIGH (ref 11.5–15.5)
WBC: 12.5 K/uL — ABNORMAL HIGH (ref 4.0–10.5)
nRBC: 0.2 % (ref 0.0–0.2)

## 2023-11-20 LAB — BASIC METABOLIC PANEL WITH GFR
Anion gap: 11 (ref 5–15)
Anion gap: 13 (ref 5–15)
BUN: 28 mg/dL — ABNORMAL HIGH (ref 6–20)
BUN: 37 mg/dL — ABNORMAL HIGH (ref 6–20)
CO2: 23 mmol/L (ref 22–32)
CO2: 21 mmol/L — ABNORMAL LOW (ref 22–32)
Calcium: 8.2 mg/dL — ABNORMAL LOW (ref 8.9–10.3)
Calcium: 8.3 mg/dL — ABNORMAL LOW (ref 8.9–10.3)
Chloride: 97 mmol/L — ABNORMAL LOW (ref 98–111)
Chloride: 97 mmol/L — ABNORMAL LOW (ref 98–111)
Creatinine, Ser: 2.23 mg/dL — ABNORMAL HIGH (ref 0.44–1.00)
Creatinine, Ser: 2.69 mg/dL — ABNORMAL HIGH (ref 0.44–1.00)
GFR, Estimated: 25 mL/min — ABNORMAL LOW (ref >60)
GFR, Estimated: 20 mL/min — ABNORMAL LOW (ref >60)
Glucose, Bld: 126 mg/dL — ABNORMAL HIGH (ref 70–99)
Glucose, Bld: 106 mg/dL — ABNORMAL HIGH (ref 70–99)
Potassium: 4 mmol/L (ref 3.5–5.1)
Potassium: 4 mmol/L (ref 3.5–5.1)
Sodium: 131 mmol/L — ABNORMAL LOW (ref 135–145)
Sodium: 131 mmol/L — ABNORMAL LOW (ref 135–145)

## 2023-11-20 LAB — MAGNESIUM: Magnesium: 2.5 mg/dL — ABNORMAL HIGH (ref 1.7–2.4)

## 2023-11-20 LAB — GLUCOSE, CAPILLARY
Glucose-Capillary: 116 mg/dL — ABNORMAL HIGH (ref 70–99)
Glucose-Capillary: 177 mg/dL — ABNORMAL HIGH (ref 70–99)
Glucose-Capillary: 148 mg/dL — ABNORMAL HIGH (ref 70–99)
Glucose-Capillary: 122 mg/dL — ABNORMAL HIGH (ref 70–99)
Glucose-Capillary: 148 mg/dL — ABNORMAL HIGH (ref 70–99)
Glucose-Capillary: 172 mg/dL — ABNORMAL HIGH (ref 70–99)

## 2023-11-20 LAB — ECHOCARDIOGRAM LIMITED
AR max vel: 0.74 cm2
AV Area VTI: 0.82 cm2
AV Area mean vel: 0.96 cm2
AV Mean grad: 22 mmHg
AV Peak grad: 41.2 mmHg
Ao pk vel: 3.21 m/s
Area-P 1/2: 4.49 cm2
Height: 64 in
S' Lateral: 5 cm
Weight: 3121.71 [oz_av]

## 2023-11-20 LAB — COOXEMETRY PANEL
Carboxyhemoglobin: 2.2 % — ABNORMAL HIGH (ref 0.5–1.5)
Carboxyhemoglobin: 2 % — ABNORMAL HIGH (ref 0.5–1.5)
Methemoglobin: <0.7 % (ref 0.0–1.5)
Methemoglobin: 0.7 % (ref 0.0–1.5)
O2 Saturation: 45.9 %
O2 Saturation: 63.6 %
Total hemoglobin: <6.9 g/dL — CL (ref 12.0–16.0)
Total hemoglobin: 8.8 g/dL — ABNORMAL LOW (ref 12.0–16.0)

## 2023-11-20 LAB — CG4 I-STAT (LACTIC ACID): Lactic Acid, Venous: 1.7 mmol/L (ref 0.5–1.9)

## 2023-11-20 LAB — PREPARE RBC (CROSSMATCH)

## 2023-11-20 MED ORDER — PERFLUTREN LIPID MICROSPHERE
1.0000 mL | INTRAVENOUS | Status: AC | PRN
  Administered 2023-11-20: 6 mL via INTRAVENOUS

## 2023-11-20 MED ORDER — FUROSEMIDE 10 MG/ML IJ SOLN: 80.0000 mg | Freq: Once | INTRAMUSCULAR | Status: DC

## 2023-11-20 MED ORDER — ALBUMIN HUMAN 25 % IV SOLN: 25.0000 g | Freq: Once | INTRAVENOUS | Status: DC

## 2023-11-20 MED ORDER — OXIDIZED CELLULOSE EX PADS
1.0000 | MEDICATED_PAD | Freq: Once | CUTANEOUS | Status: DC
  Filled 2023-11-20: qty 1

## 2023-11-20 MED ORDER — SODIUM CHLORIDE 0.9% IV SOLUTION: Freq: Once | INTRAVENOUS | Status: AC

## 2023-11-20 MED ORDER — GUAIFENESIN-DM 100-10 MG/5ML PO SYRP
5.0000 mL | ORAL_SOLUTION | ORAL | Status: AC | PRN
  Administered 2023-11-22: 5 mL via ORAL
  Filled 2023-11-20: qty 5

## 2023-11-20 MED ORDER — IPRATROPIUM-ALBUTEROL 0.5-2.5 (3) MG/3ML IN SOLN
3.0000 mL | Freq: Two times a day (BID) | RESPIRATORY_TRACT | Status: DC
  Administered 2023-11-20: 3 mL via RESPIRATORY_TRACT
  Filled 2023-11-20 (×2): qty 3

## 2023-11-20 MED ORDER — FUROSEMIDE 10 MG/ML IJ SOLN
160.0000 mg | Freq: Once | INTRAVENOUS | Status: AC
  Administered 2023-11-20: 160 mg via INTRAVENOUS
  Filled 2023-11-20: qty 16

## 2023-11-20 MED ORDER — METOLAZONE 5 MG PO TABS
5.0000 mg | ORAL_TABLET | Freq: Once | ORAL | Status: AC
  Administered 2023-11-20: 5 mg via ORAL
  Filled 2023-11-20: qty 1

## 2023-11-20 NOTE — Progress Notes (Signed)
 Occupational Therapy Treatment Patient Details Name: Kayla Schmitt MRN: [id number] DOB: 08-30-1965 Today's Date: 11/20/2023   History of present illness 58 y.o female admitted 7/15 from [facility] for SOB, CHF exacerbation. 7/17 attempted RHC but couldn't lie flat with pulmonary edema and decompensation requiring intubation. 7/19 CRRT. 7/20 extubation. PMHx: admission 6/22-7/1 for LLE pain, sepsis, necrotizing fasciitis. 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 CAD, aortic stenosis, HTN, CHF, PAD, bil transmet amputation, HLD, hypothyroidsim, T2DM   OT comments  Pt seen for first OT session since intubation/extubation and pt eager to mobilize. Overall, pt requiring +2 assist for bed mobility and transfers OOB via Stedy d/t strength and endurance deficits. Pt able to manage UB ADLs with Setup assist but requiring Max A for LB ADLs d/t deficits in dynamic sitting balance. Pt reports an interest in intensive rehab services prior to DC home given good progress pt previously made at this venue. VSS on 1 L O2.      If plan is discharge home, recommend the following:  A lot of help with walking and/or transfers;A lot of help with bathing/dressing/bathroom   Equipment Recommendations  Other (comment) (TBD)    Recommendations for Other Services Rehab consult    Precautions / Restrictions Precautions Precautions: Fall;Other (comment) Recall of Precautions/Restrictions: Impaired Precaution/Restrictions Comments: R transmet amputation, L BKA Restrictions Weight Bearing Restrictions Per Provider Order: Yes LLE Weight Bearing Per Provider Order: Non weight bearing       Mobility Bed Mobility Overal bed mobility: Needs Assistance Bed Mobility: Supine to Sit     Supine to sit: Min assist, +2 for physical assistance, +2 for safety/equipment, HOB elevated, Used rails     General bed mobility comments: Min A x 2 with use of bed pad to scoot EOB and light assist to lift trunk. good initiation from pt     Transfers Overall transfer level: Needs assistance Equipment used: Ambulation equipment used Transfers: Sit to/from Stand, Bed to chair/wheelchair/BSC Sit to Stand: Mod assist, +2 physical assistance, +2 safety/equipment           General transfer comment: Heavy Mod A x 2 to stand from bedside into [place]. good initiation but required +2 to lift bottom fully upright d/t pt scooting forward. Use of Stedy for transfer to chair. [place] lift pad in Insurance risk surveyor via Lift Equipment: Stedy   Balance Overall balance assessment: Needs assistance Sitting-balance support: No upper extremity supported, Feet unsupported Sitting balance-Leahy Scale: Fair     Standing balance support: Bilateral upper extremity supported, Reliant on assistive device for balance, During functional activity Standing balance-Leahy Scale: Poor                             ADL either performed or assessed with clinical judgement   ADL Overall ADL's : Needs assistance/impaired     Grooming: Set up;Sitting;Wash/dry face               Lower Body Dressing: Maximal assistance;Sitting/lateral leans Lower Body Dressing Details (indicate cue type and reason): assist to don R sock and R shoe - pt reported balance not feeling as stable EOB when attempting this task                    Extremity/Trunk Assessment Upper Extremity Assessment Upper Extremity Assessment: Generalized weakness;Right hand dominant   Lower Extremity Assessment Lower Extremity Assessment: Defer to PT evaluation        Vision   Vision Assessment?: No apparent  visual deficits   Perception     Praxis     Communication Communication Communication: No apparent difficulties   Cognition Arousal: Alert Behavior During Therapy: WFL for tasks assessed/performed Cognition: No apparent impairments                               Following commands: Impaired Following commands impaired: Only follows one  step commands consistently, Follows multi-step commands inconsistently      Cueing   Cueing Techniques: Verbal cues, Gestural cues  Exercises      Shoulder Instructions       General Comments BP WFL, SpO2 WFL on 1 L O2    Pertinent Vitals/ Pain       Pain Assessment Pain Assessment: No/denies pain  Home Living                                          Prior Functioning/Environment              Frequency  Min 2X/week        Progress Toward Goals  OT Goals(current goals can now be found in the care plan section)  Progress towards OT goals: Progressing toward goals  Acute Rehab OT Goals Patient Stated Goal: thinking about need to go back to rehab OT Goal Formulation: With patient Time For Goal Achievement: 11/27/23 Potential to Achieve Goals: Good ADL Goals Pt Will Perform Lower Body Dressing: with supervision;sit to/from stand;sitting/lateral leans Pt Will Transfer to Toilet: with supervision;bedside commode;with transfer board;squat pivot transfer;stand pivot transfer Pt Will Perform Toileting - Clothing Manipulation and hygiene: with supervision;sitting/lateral leans  Plan      Co-evaluation    PT/OT/SLP Co-Evaluation/Treatment: Yes Reason for Co-Treatment: For patient/therapist safety;To address functional/ADL transfers PT goals addressed during session: Mobility/safety with mobility OT goals addressed during session: ADL's and self-care;Proper use of Adaptive equipment and DME      AM-PAC OT 6 Clicks Daily Activity     Outcome Measure   Help from another person eating meals?: None Help from another person taking care of personal grooming?: A Little Help from another person toileting, which includes using toliet, bedpan, or urinal?: A Lot Help from another person bathing (including washing, rinsing, drying)?: A Lot Help from another person to put on and taking off regular upper body clothing?: A Little Help from another person to  put on and taking off regular lower body clothing?: A Lot 6 Click Score: 16    End of Session Equipment Utilized During Treatment: Gait belt;Oxygen   OT Visit Diagnosis: Unsteadiness on feet (R26.81);Other abnormalities of gait and mobility (R26.89);Muscle weakness (generalized) (M62.81)   Activity Tolerance Patient tolerated treatment well   Patient Left in chair;with call bell/phone within reach;with chair alarm set   Nurse Communication Mobility status;Need for lift equipment        Time: 9045-8973 OT Time Calculation (min): 32 min  Charges: OT General Charges $OT Visit: 1 Visit OT Treatments $Therapeutic Activity: 8-22 mins  Mliss NOVAK, OTR/L Acute Rehab Services Office: [phone]   Mliss Fish 11/20/2023, 10:49 AM

## 2023-11-20 NOTE — Progress Notes (Signed)
 Echocardiogram 2D Echocardiogram has been performed.  Kayla Schmitt 11/20/2023, 2:49 PM

## 2023-11-20 NOTE — Progress Notes (Signed)
 Physical Therapy Treatment Patient Details Name: Kayla Schmitt MRN: [id number] DOB: 1965/05/05 Today's Date: 11/20/2023   History of Present Illness 58 y.o female admitted 7/15 from AIR for SOB, CHF exacerbation. 7/17 attempted RHC but couldn't lie flat with pulmonary edema and decompensation requiring intubation. 7/19 CRRT. 7/20 extubation. PMHx: admission 6/22-7/1 for LLE pain, sepsis, necrotizing fasciitis. 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 CAD, aortic stenosis, HTN, CHF, PAD, bil transmet amputation, HLD, hypothyroidsim, T2DM    PT Comments  Pt admitted with above diagnosis. Pt was able to come to EOB with min assist of 2. Mod assist of 2 persons to stand to [facility].  Pt with decr endurance and needs aggressive therapies to gain strength and endurance. Continue to recommend post acute rehab > 3 hours day.  Will continue to follow pt.  Pt currently with functional limitations due to the deficits listed below (see PT Problem List). Pt will benefit from acute skilled PT to increase their independence and safety with mobility to allow discharge.       If plan is discharge home, recommend the following: A lot of help with walking and/or transfers;A lot of help with bathing/dressing/bathroom;Assistance with cooking/housework;Assist for transportation;Help with stairs or ramp for entrance   Can travel by private vehicle        Equipment Recommendations  None recommended by PT    Recommendations for Other Services OT consult;Rehab consult     Precautions / Restrictions Precautions Precautions: Fall;Other (comment) Recall of Precautions/Restrictions: Impaired Precaution/Restrictions Comments: R transmet amputation, L BKA Restrictions Weight Bearing Restrictions Per Provider Order: Yes LLE Weight Bearing Per Provider Order: Non weight bearing     Mobility  Bed Mobility Overal bed mobility: Needs Assistance Bed Mobility: Supine to Sit     Supine to sit: Min assist, +2 for physical assistance, +2 for  safety/equipment, HOB elevated, Used rails     General bed mobility comments: Min A x 2 with use of bed pad to scoot EOB and light assist to lift trunk. good initiation from pt    Transfers Overall transfer level: Needs assistance Equipment used: Ambulation equipment used Transfers: Sit to/from Stand, Bed to chair/wheelchair/BSC Sit to Stand: Mod assist, +2 physical assistance, +2 safety/equipment           General transfer comment: Heavy Mod A x 2 to stand from bedside into [place]. good initiation but required +2 to lift bottom fully upright d/t pt scooting forward. Use of Stedy for transfer to chair. [place] lift pad in Materials engineer: Stedy  Ambulation/Gait                   Stairs             Wheelchair Mobility     Tilt Bed    Modified Rankin (Stroke Patients Only)       Balance Overall balance assessment: Needs assistance Sitting-balance support: No upper extremity supported, Feet unsupported Sitting balance-Leahy Scale: Fair Sitting balance - Comments: min assist with progression to CGA EOB   Standing balance support: Bilateral upper extremity supported, Reliant on assistive device for balance, During functional activity Standing balance-Leahy Scale: Poor Standing balance comment: relies on Stedy for support and +2 mod assist                            Communication Communication Communication: No apparent difficulties  Cognition Arousal: Alert Behavior During Therapy: [facility] for tasks assessed/performed  Following commands: Impaired Following commands impaired: Only follows one step commands consistently, Follows multi-step commands inconsistently    Cueing Cueing Techniques: Verbal cues, Gestural cues  Exercises General Exercises - Lower Extremity Long Arc Quad: AROM, Right, 5 reps, Seated    General Comments General comments (skin integrity, edema, etc.): BP WFL, SpO2  WFL on 1L      Pertinent Vitals/Pain Pain Assessment Pain Assessment: No/denies pain Breathing: normal Negative Vocalization: none Facial Expression: smiling or inexpressive Body Language: relaxed Consolability: no need to console PAINAD Score: 0    Home Living                          Prior Function            PT Goals (current goals can now be found in the care plan section) Progress towards PT goals: Progressing toward goals    Frequency    Min 2X/week      PT Plan      Co-evaluation PT/OT/SLP Co-Evaluation/Treatment: Yes Reason for Co-Treatment: For patient/therapist safety;To address functional/ADL transfers PT goals addressed during session: Mobility/safety with mobility OT goals addressed during session: ADL's and self-care;Proper use of Adaptive equipment and DME      AM-PAC PT 6 Clicks Mobility   Outcome Measure  Help needed turning from your back to your side while in a flat bed without using bedrails?: A Little Help needed moving from lying on your back to sitting on the side of a flat bed without using bedrails?: A Lot Help needed moving to and from a bed to a chair (including a wheelchair)?: Total Help needed standing up from a chair using your arms (e.g., wheelchair or bedside chair)?: Total Help needed to walk in hospital room?: Total Help needed climbing 3-5 steps with a railing? : Total 6 Click Score: 9    End of Session Equipment Utilized During Treatment: Gait belt;Oxygen  Activity Tolerance: Patient limited by fatigue Patient left: with call bell/phone within reach;in chair;with chair alarm set Nurse Communication: Mobility status;Need for lift equipment PT Visit Diagnosis: Unsteadiness on feet (R26.81);Other abnormalities of gait and mobility (R26.89);Muscle weakness (generalized) (M62.81);Difficulty in walking, not elsewhere classified (R26.2)     Time: 9044-8973 PT Time Calculation (min) (ACUTE ONLY): 31 min  Charges:     $Therapeutic Exercise: 8-22 mins $Therapeutic Activity: 8-22 mins PT General Charges $$ ACUTE PT VISIT: 1 Visit                     Kayla Schmitt,PT Acute Rehab Services [phone]    Kayla Schmitt [place]vel 11/20/2023, 11:21 AM

## 2023-11-20 NOTE — Progress Notes (Signed)
 NAME:  Kayla Schmitt, MRN:  [id number], DOB:  1965/05/15, LOS: 8 ADMISSION DATE:  11/12/2023, CONSULTATION DATE:  11/14/23 REFERRING MD:  Bensihmon, CHIEF COMPLAINT:  SOB   History of Present Illness:  58 year old woman with ischemic cardiomyopathy, aortic stenosis, recent nec fasc s/p BKA 6/25 who was in CIR then developed worsening SOB so admitted back to [facility].  Stubborn diuresis so attempted RHC today but unable to lie flat.  CXR showing pulmonary edema, bringing to ICU for inotropes and BIPAP.  Full ROS as below, understandably anxious.  Pertinent  Medical History  CAD Aortic stenosis Class 3 obesity Nec fasc Hypothyroidism PVD DM2 GERD Anemia  Significant Hospital Events: Including procedures, antibiotic start and stop dates in addition to other pertinent events   7/16 admission 7/17 ICU, intubated 7/18 started spiking fever with Tmax 101.5, tachycardic.  X-ray chest still showing bilateral infiltrate but FiO2 is coming down.  Lasix  infusion increased to 30 mg/h 7/19 started on CRRT, tolerating spontaneous breathing trial 7/20 extubated 7/22 CRRT holiday, Levophed /dobutamine   Interim History / Subjective:  No complaints, verbalizes prior L elbow pain she thinks was from therapy- no edema/ erythema Non bloody brown BM overnight Getting 1u PRBC for Hgb 6.9 CVP 7 Coox 46%, NE 3, DBA 2.5 HF starting lasix  gtt afebrile  Objective    Blood pressure (!) 83/45, pulse 100, temperature 97.6 F (36.4 C), temperature source Oral, resp. rate (!) 26, height 5' 4 (1.626 m), weight 88.5 kg, SpO2 94%. CVP:  [5 mmHg-8 mmHg] 8 mmHg      Intake/Output Summary (Last 24 hours) at 11/20/2023 0817 Last data filed at 11/20/2023 0700 Gross per 24 hour  Intake 533.42 ml  Output 157.4 ml  Net 376.02 ml   Filed Weights   11/16/23 0701 11/19/23 0600 11/20/23 0500  Weight: 92.5 kg 92 kg 88.5 kg   Examination: General:  chronically ill appearing adult female in bed in NAD HEENT:  MM pink/dry, pupils 3/r, anicteric,  Neuro: Awake, oriented x 3, fatigued appearing, MAE CV: rr, +murmur, R internal jugular trialysis, L internal jugular CVL- slight oozing at site PULM:  non labored, clear, diminished in bases, RA at 93% GI: obese, soft, bs+, NT, purwick Extremities: warm/dry, trace BLE edema, L BKA with cdi wrap, prior R transmetatarsal amputation Skin: no rashes  Afebrile UOP 125 ml/ 24hrs  Labs> WBC 17.5> 13.1, H/H 7.4/ 23> 6.9/ 22.8, Na 131, K 4, BUN/ sCR 14/ 1.55> 28/ 2.23, Mag 2.5        Patient Lines/Drains/Airways Status     Active Line/Drains/Airways     Name Placement date Placement time Site Days   Arterial Line 11/16/23 Right Brachial 11/16/23  1206  Brachial  2   PICC Single Lumen 10/26/23 Left Basilic 46 cm 1 cm 10/26/23  8788  Basilic  23   CVC Triple Lumen 11/14/23 Left Subclavian 11/14/23  1700  -- 4   Hemodialysis Catheter Right Internal jugular Triple lumen Temporary (Non-Tunneled) 11/16/23  1205  Internal jugular  2   Urethral Catheter Andie RN Temperature probe 14 Fr. 11/14/23  1700  Temperature probe  4   Fecal Management System 38 mL 11/16/23  0454  -- 2   Wound 10/22/23 1500 Surgical Open Surgical Incision Other (Comment) Left;Proximal 10/22/23  1500  Other (Comment)  27   Wound 10/29/23 1410 Other (Comment) Thigh Distal;Left;Posterior 10/29/23  1410  Thigh  20        Resolved problem list  Hypervolemic hyponatremia/hyperphosphatemia  Assessment and Plan  Acute on chronic HFrEF with cardiogenic shock  Multivessel coronary artery disease with recent acute NSTEMI Severe aortic stenosis Mild to moderate MR Peripheral arterial disease HTN - 11/15/23 EF 25% P:  - per AHF - NE/ DBA per AHF, goal MAP > 65 - trending coox/ CVP - lasix  gtt today w/ metolazone  - repeat echo today  - ASA/ statin - GDMT limited by hypotension/ AKI  Acute respiratory failure with hypoxia and hypercapnia due to pulmonary edema and possible aspiration  pneumonia-improving  - extubated 7/20 - cont to wean supplemental O2 for sat goal > 92% - pulm hygiene, PT/ OT, mobilize  - cont 7 days of abx therapy  - volume removal as below   Recent MRSA bacteremia and 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 - s/p coarse of daptomycin  and currently on empiric 7 day course of abx - linezolid / zosyn  - remains afebrile, continue downtrending of WBC  Hypothyroidism - synthroid   AKI on CKD stage IIIa, likely due to cardiorenal syndrome, currently on CRRT Hypervolemic hyponatremia - CRRT holiday 7/22.  Poor UOP to lasix  yesterday but no acute needs to restart CRRT.  Awaiting hopeful renal recovery - appreciate nephrology - lasix  gtt today/ strict I/Os - trend renal indices  - strict I/Os, daily wts - avoid nephrotoxins, renal dose meds, hemodynamic support as above  Diabetes type 2, complicated with vasculopathy - cont CBG q4, prn mSSI with semglee  6 units BID  Obesity, BMI 33 - continue heart healthy/ carb modified diet - OP weight loss counseling   Best Practice (right click and Reselect all SmartList Selections daily)   Diet/type: heart healthy/ carb modified DVT prophylaxis prophylactic heparin   Pressure ulcer(s): Please see nursing notes GI prophylaxis: PPI Lines: Central line/HD catheter, still needed Foley:  N/A Code Status:  full code Last date of multidisciplinary goals of care discussion [7/22 updated mother at bedside    Mother updated at bedside 7/23  CCT: 34 mins  Lyle Pesa, MSN, AG-ACNP-BC [facility] 11/20/2023, 8:17 AM  See Amion for pager If no response to pager , please call [phone] until 7pm After 7:00 pm call Elink  [phone]

## 2023-11-20 NOTE — Progress Notes (Signed)
 Inpatient Rehab Admissions Coordinator:    Pt. Wishes to come to CIR once medically ready. Currently on CRRT holiday, will see how she does with that and then potentially send case to insurance sometime this week.  Leita Kleine, MS, CCC-SLP Rehab Admissions Coordinator  [phone] (celll) [phone] (office)

## 2023-11-20 NOTE — Progress Notes (Signed)
 eLink Physician-Brief Progress Note Patient Name: Kayla Schmitt DOB: 10/20/1965 MRN: [id number]   Date of Service  11/20/2023  HPI/Events of Note  Co-Ox hemoglobin 6.9 gm / dl. CBC result pending.  eICU Interventions  Will await confirmation of hemoglobin on CBC prior to ordering transfusion of a unit of PRBC for hemoglobin < 7.0 gm/dl.        Ashad Fawbush U Draycen Leichter 11/20/2023, 6:14 AM

## 2023-11-20 NOTE — Progress Notes (Addendum)
 Advanced Heart Failure Rounding Note  Cardiologist: Vishnu SHAUNNA Maywood, MD  AHF Consulting MD: Dr. Cherrie Chief Complaint: Acute on chronic HFpEF / RV Failure Patient Profile   58 y.o. female with history of PAD with prior SFA stents and bilateral TMA, severe 3v CAD, MR/AS, DM II, anemia, HTN, HLD, hypothyroidism.  Admitted in 6/25 with MRSA bactermia and necrotizing fasciitis s/p left BKA. Transferred back to [facility] from rehab d/t acute on chronic CHF with concern for low-output and acute respiratory failure.  Subjective:    7/18: aborted cath for acute respiratory distress; intubated; DBA started 7/19: started on CRRT 7/20: extubated 7/21: hgb 6.5 s/p 1u RBCs 7/22: CRRT holiday today  Coox 46 this morning with hgb 6.9. CO/CI (fick) 5.4/2.7. UOP 125cc with IV Lasix  120 (some occurrences d/t incontinence) Hgb 7.4>6.9.   Sitting up in bed, more awake. Feeling well this morning, no complaints.   Objective:    Weight Range: 88.5 kg Body mass index is 33.49 kg/m.   Vital Signs:   Temp:  [97.8 F (36.6 C)-98.5 F (36.9 C)] 98.1 F (36.7 C) (07/23 0340) Pulse Rate:  [86-104] 100 (07/23 0745) Resp:  [12-38] 26 (07/23 0745) BP: (77-108)/(45-74) 83/45 (07/23 0730) SpO2:  [83 %-100 %] 94 % (07/23 0745) Arterial Line BP: (78-179)/(45-75) 97/53 (07/23 0745) Weight:  [88.5 kg] 88.5 kg (07/23 0500) Last BM Date : 11/19/23  Weight change: Filed Weights   11/16/23 0701 11/19/23 0600 11/20/23 0500  Weight: 92.5 kg 92 kg 88.5 kg   Intake/Output:  Intake/Output Summary (Last 24 hours) at 11/20/2023 0749 Last data filed at 11/20/2023 0700 Gross per 24 hour  Intake 551.91 ml  Output 185.1 ml  Net 366.81 ml    Physical Exam    CVP 7 General: Chronically-ill appearing. Pale Cardiac: S1 and S2 present. 2/6 systolic murmur. Pericardial rub, loudest at apex.  Resp: Lung sounds clear and equal B/L  Abdomen: Soft, non-tender, non-distended. + BS Extremities: Warm and dry.  Trace BLE edema.  Neuro: Drowsy, although improving and oriented x3. Affect pleasant.  Telemetry   SR in 100s (personally reviewed)  Labs    CBC Recent Labs    11/19/23 0304 11/20/23 0520  WBC 17.5* 13.1*  NEUTROABS 12.4* 8.5*  HGB 7.4* 6.9*  HCT 23.3* 22.8*  MCV 90.7 91.9  PLT 486* 357   Basic Metabolic Panel Recent Labs    92/78/74 1457 11/19/23 0304 11/19/23 1310 11/20/23 0520  NA 135 131* 130* 131*  K 4.2 4.1 3.7 4.0  CL 101 97* 97* 97*  CO2 25 22 20* 23  GLUCOSE 164* 140* 167* 126*  BUN 18 14 14  28*  CREATININE 1.42* 1.35* 1.55* 2.23*  CALCIUM  8.2* 8.0* 7.9* 8.2*  MG  --  2.5*  --  2.5*  PHOS 2.7 2.3*  --   --    Liver Function Tests Recent Labs    11/18/23 1457 11/19/23 0304  ALBUMIN  2.0* 2.0*   BNP (last 3 results) Recent Labs    11/03/23 0112 11/07/23 1457 11/13/23 0521  BNP 793.8* 336.4* 1,626.5*   Medications:    Scheduled Medications:  sodium chloride    Intravenous Once   aspirin   81 mg Oral Daily   atorvastatin   40 mg Oral Daily   Chlorhexidine  Gluconate Cloth  6 each Topical Daily   docusate sodium   100 mg Oral BID   feeding supplement  237 mL Oral Q1400   furosemide   80 mg Intravenous Once   furosemide   80 mg Intravenous Once   gabapentin   300 mg Oral QHS   heparin  injection (subcutaneous)  5,000 Units Subcutaneous Q8H   insulin  aspart  0-15 Units Subcutaneous Q4H   insulin  glargine-yfgn  6 Units Subcutaneous BID   ipratropium-albuterol   3 mL Nebulization Q6H   levothyroxine   150 mcg Oral Q0600   linezolid   600 mg Oral Q12H   multivitamin  1 tablet Oral QHS   nutrition supplement (JUVEN)  1 packet Oral BID BM   pantoprazole   40 mg Oral Daily   sodium chloride  flush  10-40 mL Intracatheter Q12H   Infusions:  albumin  human     albumin  human     DOBUTamine  2.5 mcg/kg/min (11/20/23 0700)   heparin  10,000 units/ 20 mL infusion syringe 750 Units/hr (11/18/23 2225)   norepinephrine  (LEVOPHED ) Adult infusion 3 mcg/min (11/20/23  0700)   piperacillin -tazobactam (ZOSYN )  IV Stopped (11/20/23 0201)   vasopressin  Stopped (11/18/23 1416)   PRN Medications: acetaminophen , Gerhardt's butt cream, lidocaine , mouth rinse, oxyCODONE , polyethylene glycol, prochlorperazine , senna-docusate, sodium chloride  flush  Assessment/Plan   Acute on chronic HFpEF>Mixed Shock; Cardiogenic/Septic - TTE 12/24: EF 40-45% - TTE 6/25: EF 50-55% - TEE 6/25: EF 60-65%, RV okay, mild to mod MR, mod to sev AS with mG 27 mmHg and AVA 1.07 cm2 - TTE 11/15/23 LVEF 25% - Taken for [facility] 7/17, however developed flash pulmonary edema. Unable to lie flat for procedure.  - CVP 7. Minimal UOP off CRRT yesterday with Lasix  IV 120, some uncharted UOP per pt/RN.  - No indication for CRRT at this time. Continue Lasix  160 mg IV bid today.  - Coox 46 with hgb 6.9. CI ~2.7. On DBA 2.5 + NE 3. Wean NE as able. - Continue DBA 2.5 while awaiting renal recovery - GDMT limited by renal function and hypotension - pericardial rub, loudest at apex on exam. Repeat echo today  2. Acute respiratory failure with hypoxia - CXR 7/20 with b/l diffuse airspace opacities, likely pulmonary edema, though given fevers also concern for PNA - Extubated 7/20.  - Improving. Now on [place]. Wean as able    3. CAD - NSTEMI in 4/24. Multivessel CAD. CABG had been recommended but managed medically d/t other active medical issues - hs-trop elevated 486 yesterday. Further ischemic eval pending renal function.  - continue aspirin  + statin   4. Valvular heart disease - Mild to moderate MR and moderate to severe AS on echo 6/25 - Repeat echo 7/25 with extremely poor windows, based on exam expect AS moderate-severe - However, she is not a TAVR or SAVR candidate currently so even if she has severe AS it would not change current management   5. MRSA bacteremia Necrotizing fasciitis - S/p recent left BKA 6/25; S/p TMA in 4/24  6. Leukocytosis - WBC14.8>15.2>17.5, peaked, now 13.1 - On  linezolid  + zosyn  - KUB with nonobstructive gas pattern  7. PAD - hx SFA stents - On aspirin  + statin. Had been on plavix  but stopped d/t risk of bleeding.   8. Anemia - s/p 1 u RBC 7/14 - Hx GI bleed from AVMs, ? D/t aortic valve stenosis - hgb 6.5>1u RBC>7.4>6.9>1u RBC today - fecal occult negative - will need Fe infusions in OP   9. ARF on CKD IIIa - In setting of volume overload/low-output; volume now improved. - Started on CRRT 7/19. Now off 7/22.  - Minimal documented UOP, however incontinent of urine with stool, unknown amount - CVP 7 today. Plan for Lasix  as  anette - hope for renal recovery as she is not a long term iHD candidate  Length of Stay: 8  CRITICAL CARE Performed by: [place] Kymberlee Viger  Total critical care time: 13 minutes  -Critical care time was exclusive of separately billable procedures and treating other patients. -Critical care was necessary to treat or prevent imminent or life-threatening deterioration. -Critical care was time spent personally by me on the following activities: development of treatment plan with patient and/or surrogate as well as nursing, discussions with consultants, evaluation of patient's response to treatment, examination of patient, obtaining history from patient or surrogate, ordering and performing treatments and interventions, ordering and review of laboratory studies, ordering and review of radiographic studies, pulse oximetry and re-evaluation of patient's condition.  [place] Mare Ludtke, NP  11/20/2023, 7:49 AM  Advanced Heart Failure Team Pager [phone] (M-F; 7a - 5p)  Please contact [facility] for night-coverage after hours (5p -7a ) and weekends on [email or web address]

## 2023-11-20 NOTE — Progress Notes (Signed)
 Attempted Echocardiogram, per RN try back later after 1 pm, patient just got in the chair.

## 2023-11-20 NOTE — Plan of Care (Signed)
  Problem: Clinical Measurements: Goal: Ability to maintain clinical measurements within normal limits will improve Outcome: Progressing Goal: Will remain free from infection Outcome: Progressing Goal: Diagnostic test results will improve Outcome: Progressing   Problem: Activity: Goal: Risk for activity intolerance will decrease Outcome: Progressing   Problem: Pain Managment: Goal: General experience of comfort will improve and/or be controlled Outcome: Progressing   Problem: Safety: Goal: Ability to remain free from injury will improve Outcome: Progressing   Problem: Coping: Goal: Ability to adjust to condition or change in health will improve Outcome: Progressing

## 2023-11-20 NOTE — Progress Notes (Signed)
 Patient ID: Kayla Schmitt, female   DOB: 10-31-1965, 58 y.o.   MRN: [id number] [facility] Progress Note   Assessment/ Plan:   1. Acute kidney Injury on chronic kidney disease stage III (baseline creatinine appears 1.3-1.6): Hemodynamically mediated in the setting of acute exacerbation of congestive heart failure, remains oliguric after discontinuation of CRRT yesterday.  CVP overnight again was 6 cm water with net positive fluid balance of 0.4 L overnight.  She did not respond to furosemide  bolus yesterday and I will rechallenge her again with albumin /furosemide  today.  No acute indication for RRT.  She remains on low-dose Levophed  as well as dobutamine .  Unfortunately, too early to determine if this is ESRD. 2.  Acute exacerbation of congestive heart failure/cardiogenic shock: History of congestive heart failure with reduced ejection fraction and decompensation noted during this hospitalization.  Refractory to diuretics in the setting of inotropic/pressor support and started on CRRT transiently for extracorporeal volume unloading.  She appears compensated from a respiratory standpoint and is on low-dose inotrope/pressor support. 3.  Hyponatremia: Hypervolemic hyponatremia and anticipate will improve with continued ultrafiltration.  Not getting any free water or overtly hypotonic fluids.  Will monitor lab trends. 4.  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  Continue antibiotics per primary service. 5.  Anemia: Secondary to critical illness and prior history of GI bleed.  PRBC transfusion ordered this morning by CCM after acute hemoglobin drop.  Subjective:   Without acute events overnight after discontinuation of CRRT.  Being prepared for PRBC transfusion this morning.   Objective:   BP (!) 83/45   Pulse 100   Temp 98.1 F (36.7 C) (Axillary)   Resp (!) 26   Ht 5' 4 (1.626 m)   Wt 88.5 kg    SpO2 94%   BMI 33.49 kg/m   Intake/Output Summary (Last 24 hours) at 11/20/2023 0751 Last data filed at 11/20/2023 0700 Gross per 24 hour  Intake 551.91 ml  Output 185.1 ml  Net 366.81 ml   Weight change: -3.5 kg  Physical Exam: Gen: Comfortably resting in bed getting nebulizer, mother at bedside CVS: Pulse regular rhythm, normal rate S1 and S2 normal Resp: No audible rales/rhonchi at this time, poor inspiratory effort Abd: Soft, obese, nontender, bowel sounds normal Ext: Status post left BKA and right forefoot amputation.  Trace edema over dependent areas..  Imaging: DG Abd 1 View Result Date: 11/19/2023 CLINICAL DATA:  Diarrhea. EXAM: ABDOMEN - 1 VIEW COMPARISON:  11/14/2023. FINDINGS: Nonobstructive bowel gas pattern. Scattered gas and stool in the colon. No acute osseous abnormality. IMPRESSION: Nonobstructive bowel gas pattern. Scattered gas and stool in the colon. Electronically Signed   By: Harrietta Sherry M.D.   On: 11/19/2023 12:38    Labs: BMET Recent Labs  Lab 11/16/23 0129 11/16/23 1602 11/17/23 0525 11/17/23 1600 11/18/23 0536 11/18/23 1457 11/19/23 0304 11/19/23 1310 11/20/23 0520  NA 134* 135 135 133* 134* 135 131* 130* 131*  K 4.3 4.2 4.2 4.6 4.0 4.2 4.1 3.7 4.0  CL 98 98 99 98 100 101 97* 97* 97*  CO2 22 21* 23 21* 24 25 22  20* 23  GLUCOSE 302* 213* 166* 214* 134* 164* 140* 167* 126*  BUN 53* 51* 30* 29* 22* 18 14 14  28*  CREATININE 2.97* 2.88* 0.79 1.77* 1.59* 1.42* 1.35* 1.55* 2.23*  CALCIUM  8.3* 8.1* 8.0* 8.4* 8.1* 8.2* 8.0* 7.9* 8.2*  PHOS 6.0* 5.7* 3.3 2.7 2.6 2.7  2.3*  --   --    CBC Recent Labs  Lab 11/16/23 0129 11/18/23 0536 11/18/23 1417 11/19/23 0304 11/20/23 0520  WBC 14.8* 15.2* 17.2* 17.5* 13.1*  NEUTROABS 11.3* 11.5*  --  12.4* 8.5*  HGB 7.6* 6.5* 7.3* 7.4* 6.9*  HCT 25.0* 21.3* 23.5* 23.3* 22.8*  MCV 94.0 91.8 90.0 90.7 91.9  PLT 536* 488* 498* 486* 357    Medications:     sodium chloride    Intravenous Once   aspirin    81 mg Oral Daily   atorvastatin   40 mg Oral Daily   Chlorhexidine  Gluconate Cloth  6 each Topical Daily   docusate sodium   100 mg Oral BID   feeding supplement  237 mL Oral Q1400   furosemide   80 mg Intravenous Once   furosemide   80 mg Intravenous Once   gabapentin   300 mg Oral QHS   heparin  injection (subcutaneous)  5,000 Units Subcutaneous Q8H   insulin  aspart  0-15 Units Subcutaneous Q4H   insulin  glargine-yfgn  6 Units Subcutaneous BID   ipratropium-albuterol   3 mL Nebulization Q6H   levothyroxine   150 mcg Oral Q0600   linezolid   600 mg Oral Q12H   multivitamin  1 tablet Oral QHS   nutrition supplement (JUVEN)  1 packet Oral BID BM   pantoprazole   40 mg Oral Daily   sodium chloride  flush  10-40 mL Intracatheter Q12H    Gordy Blanch, MD 11/20/2023, 7:51 AM '

## 2023-11-21 LAB — TYPE AND SCREEN
ABO/RH(D): O POS
Antibody Screen: NEGATIVE
Unit division: 0
Unit division: 0

## 2023-11-21 LAB — CBC WITH DIFFERENTIAL/PLATELET
Abs Immature Granulocytes: 0.31 K/uL — ABNORMAL HIGH (ref 0.00–0.07)
Basophils Absolute: 0.1 K/uL (ref 0.0–0.1)
Basophils Relative: 1 %
Eosinophils Absolute: 0.6 K/uL — ABNORMAL HIGH (ref 0.0–0.5)
Eosinophils Relative: 5 %
HCT: 26.4 % — ABNORMAL LOW (ref 36.0–46.0)
Hemoglobin: 8.4 g/dL — ABNORMAL LOW (ref 12.0–15.0)
Immature Granulocytes: 3 %
Lymphocytes Relative: 24 %
Lymphs Abs: 2.8 K/uL (ref 0.7–4.0)
MCH: 28.5 pg (ref 26.0–34.0)
MCHC: 31.8 g/dL (ref 30.0–36.0)
MCV: 89.5 fL (ref 80.0–100.0)
Monocytes Absolute: 0.9 K/uL (ref 0.1–1.0)
Monocytes Relative: 8 %
Neutro Abs: 7.1 K/uL (ref 1.7–7.7)
Neutrophils Relative %: 59 %
Platelets: 301 K/uL (ref 150–400)
RBC: 2.95 MIL/uL — ABNORMAL LOW (ref 3.87–5.11)
RDW: 17.9 % — ABNORMAL HIGH (ref 11.5–15.5)
WBC: 11.8 K/uL — ABNORMAL HIGH (ref 4.0–10.5)
nRBC: 0.3 % — ABNORMAL HIGH (ref 0.0–0.2)

## 2023-11-21 LAB — COOXEMETRY PANEL
Carboxyhemoglobin: 1.6 % — ABNORMAL HIGH (ref 0.5–1.5)
Methemoglobin: <0.7 % (ref 0.0–1.5)
O2 Saturation: 51.9 %
Total hemoglobin: 9.5 g/dL — ABNORMAL LOW (ref 12.0–16.0)

## 2023-11-21 LAB — GLUCOSE, CAPILLARY
Glucose-Capillary: 119 mg/dL — ABNORMAL HIGH (ref 70–99)
Glucose-Capillary: 125 mg/dL — ABNORMAL HIGH (ref 70–99)
Glucose-Capillary: 146 mg/dL — ABNORMAL HIGH (ref 70–99)
Glucose-Capillary: 147 mg/dL — ABNORMAL HIGH (ref 70–99)
Glucose-Capillary: 161 mg/dL — ABNORMAL HIGH (ref 70–99)
Glucose-Capillary: 163 mg/dL — ABNORMAL HIGH (ref 70–99)

## 2023-11-21 LAB — RENAL FUNCTION PANEL
Albumin: 2.1 g/dL — ABNORMAL LOW (ref 3.5–5.0)
Anion gap: 14 (ref 5–15)
BUN: 42 mg/dL — ABNORMAL HIGH (ref 6–20)
CO2: 20 mmol/L — ABNORMAL LOW (ref 22–32)
Calcium: 8.2 mg/dL — ABNORMAL LOW (ref 8.9–10.3)
Chloride: 95 mmol/L — ABNORMAL LOW (ref 98–111)
Creatinine, Ser: 3.01 mg/dL — ABNORMAL HIGH (ref 0.44–1.00)
GFR, Estimated: 18 mL/min — ABNORMAL LOW (ref >60)
Glucose, Bld: 128 mg/dL — ABNORMAL HIGH (ref 70–99)
Phosphorus: 4.8 mg/dL — ABNORMAL HIGH (ref 2.5–4.6)
Potassium: 3.8 mmol/L (ref 3.5–5.1)
Sodium: 129 mmol/L — ABNORMAL LOW (ref 135–145)

## 2023-11-21 LAB — BPAM RBC
Blood Product Expiration Date: 202508102359
Blood Product Expiration Date: 202508112359
ISSUE DATE / TIME: 202507210731
ISSUE DATE / TIME: 202507230728
Unit Type and Rh: 5100
Unit Type and Rh: 5100

## 2023-11-21 LAB — MAGNESIUM: Magnesium: 2.5 mg/dL — ABNORMAL HIGH (ref 1.7–2.4)

## 2023-11-21 MED ORDER — IPRATROPIUM-ALBUTEROL 0.5-2.5 (3) MG/3ML IN SOLN
3.0000 mL | Freq: Four times a day (QID) | RESPIRATORY_TRACT | Status: DC | PRN
  Administered 2023-11-21 – 2023-11-28 (×2): 3 mL via RESPIRATORY_TRACT
  Filled 2023-11-21 (×2): qty 3

## 2023-11-21 MED ORDER — FUROSEMIDE 10 MG/ML IJ SOLN
160.0000 mg | Freq: Once | INTRAVENOUS | Status: AC
  Administered 2023-11-21: 160 mg via INTRAVENOUS
  Filled 2023-11-21: qty 16

## 2023-11-21 NOTE — Progress Notes (Signed)
 NAME:  Kayla Schmitt, MRN:  [id number], DOB:  07/25/1965, LOS: 9 ADMISSION DATE:  11/12/2023, CONSULTATION DATE:  11/14/23 REFERRING MD:  Bensihmon, CHIEF COMPLAINT:  SOB   History of Present Illness:  58 year old woman with ischemic cardiomyopathy, aortic stenosis, recent nec fasc s/p BKA 6/25 who was in CIR then developed worsening SOB so admitted back to [facility].  Stubborn diuresis so attempted RHC today but unable to lie flat.  CXR showing pulmonary edema, bringing to ICU for inotropes and BIPAP.  Full ROS as below, understandably anxious.  Pertinent  Medical History  CAD Aortic stenosis Class 3 obesity Nec fasc Hypothyroidism PVD DM2 GERD Anemia  Significant Hospital Events: Including procedures, antibiotic start and stop dates in addition to other pertinent events   7/16 admission 7/17 ICU, intubated 7/18 started spiking fever with Tmax 101.5, tachycardic.  X-ray chest still showing bilateral infiltrate but FiO2 is coming down.  Lasix  infusion increased to 30 mg/h 7/19 started on CRRT, tolerating spontaneous breathing trial 7/20 extubated 7/22 CRRT holiday, Levophed /dobutamine  7/23 lasix  challenge, transfused 1u PRBC, remains low dose NE, DBA 2.5, CVP 7  Interim History / Subjective:  Still c/o of L elbow pain with ROM and coughing after duonebs> does not want anymore Remains on NE 2, DBA 2.5  Objective    Blood pressure 113/65, pulse 97, temperature 98.3 F (36.8 C), temperature source Oral, resp. rate 19, height 5' 4 (1.626 m), weight 86.2 kg, SpO2 96%. CVP:  [5 mmHg-16 mmHg] 6 mmHg      Intake/Output Summary (Last 24 hours) at 11/21/2023 0834 Last data filed at 11/21/2023 0800 Gross per 24 hour  Intake 1072.2 ml  Output 50 ml  Net 1022.2 ml   Filed Weights   11/19/23 0600 11/20/23 0500 11/21/23 0500  Weight: 92 kg 88.5 kg 86.2 kg   Examination: C/o of l elbow pain General:  chronically ill appearing pleasant adult female sitting upright eating   breakfast HEENT: MM pink/moist, pupils 3/r Neuro: Aox4, MAE CV: rr, SR, +murmur, R ax aline, R internal jugular trialysis  PULM:  non labored, coarse, no wheeze, congested np cough, 1L [place] GI: obese, soft, bs+, NT, purwick, ecchymosis, slight ozzing from heparin  sq sites Extremities: warm/dry, L BKA -site approx, sutured/ stapled, healing, no erythema/ drainage, prior R transmetatarsal amputation Skin: no rashes   Afebrile UOP 50 ml/ 24hrs  Not able to check CVP as pigtail on HD cath being used for vasopressor/ inotropic support  Labs> WBC 11.8, stable H/H, Na 131> 129, K 3.8, BUN/ sCr 28/ 2.23> 42/ 3.01, Mag 2.1, bicarb 20        Patient Lines/Drains/Airways Status     Active Line/Drains/Airways     Name Placement date Placement time Site Days   Arterial Line 11/16/23 Right Brachial 11/16/23  1206  Brachial  2   PICC Single Lumen 10/26/23 Left Basilic 46 cm 1 cm 10/26/23  8788  Basilic  23   CVC Triple Lumen 11/14/23 Left Subclavian 11/14/23  1700  -- 4   Hemodialysis Catheter Right Internal jugular Triple lumen Temporary (Non-Tunneled) 11/16/23  1205  Internal jugular  2   Urethral Catheter Andie RN Temperature probe 14 Fr. 11/14/23  1700  Temperature probe  4   Fecal Management System 38 mL 11/16/23  0454  -- 2   Wound 10/22/23 1500 Surgical Open Surgical Incision Other (Comment) Left;Proximal 10/22/23  1500  Other (Comment)  27   Wound 10/29/23 1410 Other (Comment) Thigh Distal;Left;Posterior 10/29/23  1410  Thigh  20        Resolved problem list  Hypervolemic hyponatremia/hyperphosphatemia  Assessment and Plan  Acute on chronic HFrEF with cardiogenic shock  Multivessel coronary artery disease with recent acute NSTEMI Severe aortic stenosis Mild to moderate MR Peripheral arterial disease HTN - 11/15/23 EF 25% P:  - per AHF - NE/ DBA per AHF, goal MAP > 65.  Plans to stop DBA today - not able to check coox/ CVP.  L internal jugular CVL d/c 7/23, only able to use  pigtail on trialysis for pressors - diuresis attempt today again per HF/ nephrology, like will resume dialysis 7/25 if no renal improvement - repeat echo 7/23> EF 25-30%, G2DD, mild to mod MR, mod-severe AS - ASA/ statin - GDMT limited by hypotension/ AKI - further ischemic evaluation pending renal recovery/ function - not a TAVR or SAVR candidate per cards  Acute respiratory failure with hypoxia and hypercapnia due to pulmonary edema and possible aspiration pneumonia-improving  - extubated 7/20 - cont to wean supplemental O2 for sat goal > 92% - pulm hygiene, PT/ OT, mobilize  - cont 7 days of abx therapy    Recent MRSA bacteremia and 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 - s/p coarse of daptomycin  and currently on empiric 7 day course of abx - linezolid / zosyn  - remains afebrile, trend clinically   Hypothyroidism - synthroid   AKI on CKD stage IIIa, likely due to cardiorenal syndrome, currently on CRRT Hypervolemic hyponatremia - CRRT holiday 7/22.  S/p lasix  challenge 7/23 with minimal UOP.  No immediate needs for dialysis today.  Redosing of lasix  today per HF, likely dialysis tomorrow if no response - appreciate nephrology - bladder scan to ensure no retention - trend renal indices  - strict I/Os, daily wts - avoid nephrotoxins, renal dose meds, hemodynamic support as above  Diabetes type 2, complicated with vasculopathy - cont CBG q4, prn mSSI, semglee  6 units BID  Obesity, BMI 33 - continue heart healthy/ carb modified diet - OP weight loss counseling   Best Practice (right click and Reselect all SmartList Selections daily)   Diet/type: heart healthy/ carb modified DVT prophylaxis prophylactic heparin   Pressure ulcer(s): Please see nursing notes GI prophylaxis: PPI Lines: Central line> HD catheter, still needed, Aline Foley:  N/A Code Status:  full code Last date of multidisciplinary goals of care discussion [7/22 updated mother at  bedside    Mother updated at bedside 7/24  CCT: 32 mins  Lyle Pesa, MSN, AG-ACNP-BC [facility] 11/21/2023, 8:34 AM  See Amion for pager If no response to pager , please call [phone] until 7pm After 7:00 pm call Elink  [phone]

## 2023-11-21 NOTE — Progress Notes (Signed)
 Inpatient Rehab Admissions Coordinator:   CIR following. Pt. Not yet medically ready but I will continue to follow and seek insurance auth once medically stable.  Leita Kleine, MS, CCC-SLP Rehab Admissions Coordinator  [phone] (celll) [phone] (office)

## 2023-11-21 NOTE — Progress Notes (Signed)
 Advanced Heart Failure Rounding Note  Cardiologist: Vishnu SHAUNNA Maywood, MD  AHF Consulting MD: Dr. Cherrie Chief Complaint: Acute on chronic HFpEF / RV Failure Patient Profile   58 y.o. female with history of PAD with prior SFA stents and bilateral TMA, severe 3v CAD, MR/AS, DM II, anemia, HTN, HLD, hypothyroidism.  Admitted in 6/25 with MRSA bactermia and necrotizing fasciitis s/p left BKA. Transferred back to [facility] from rehab d/t acute on chronic CHF with concern for low-output and acute respiratory failure.  Subjective:    7/18: aborted cath for acute respiratory distress; intubated; DBA started 7/19: started on CRRT 7/20: extubated 7/21: hgb 6.5 s/p 1u RBCs 7/22: CRRT holiday today  Central line pulled overnight by [facility] MD. Unable to check CVP/coox.  50cc urine charted on IV Lasix  160 mg IV  sCr 2.69>3.01; K 3.8  Feeling well this morning. Working with PT. No SOB, CP. Episode of dry cough last night after duo-neb.  Objective:    Weight Range: 86.2 kg Body mass index is 32.62 kg/m.   Vital Signs:   Temp:  [97.7 F (36.5 C)-98.3 F (36.8 C)] 98.3 F (36.8 C) (07/24 0700) Pulse Rate:  [75-111] 97 (07/24 0800) Resp:  [11-43] 19 (07/24 0800) BP: (81-113)/(51-70) 113/65 (07/24 0800) SpO2:  [88 %-100 %] 96 % (07/24 0800) Arterial Line BP: (74-151)/(39-117) 100/53 (07/24 0800) Weight:  [86.2 kg] 86.2 kg (07/24 0500) Last BM Date : 11/20/23  Weight change: Filed Weights   11/19/23 0600 11/20/23 0500 11/21/23 0500  Weight: 92 kg 88.5 kg 86.2 kg   Intake/Output:  Intake/Output Summary (Last 24 hours) at 11/21/2023 0840 Last data filed at 11/21/2023 0800 Gross per 24 hour  Intake 1072.2 ml  Output 50 ml  Net 1022.2 ml    Physical Exam    General: Chronically-ill appearing. No distress on [place] Cardiac: JVP ~8cm. S1 and S2 present. No murmurs or rub. Abdomen: Soft, non-tender, non-distended. + diarrhea  Extremities: Warm and dry.  No peripheral edema. + B/L  amps Neuro: Alert and oriented x3. Affect pleasant.  Telemetry   SR in 90s, occasional PVCs (personally reviewed)  Labs    CBC Recent Labs    11/20/23 0520 11/20/23 1655 11/21/23 0535  WBC 13.1* 12.5* 11.8*  NEUTROABS 8.5*  --  7.1  HGB 6.9* 8.3* 8.4*  HCT 22.8* 26.1* 26.4*  MCV 91.9 89.1 89.5  PLT 357 321 301   Basic Metabolic Panel Recent Labs    92/77/74 0304 11/19/23 1310 11/20/23 0520 11/20/23 1655 11/21/23 0535  NA 131*   < > 131* 131* 129*  K 4.1   < > 4.0 4.0 3.8  CL 97*   < > 97* 97* 95*  CO2 22   < > 23 21* 20*  GLUCOSE 140*   < > 126* 106* 128*  BUN 14   < > 28* 37* 42*  CREATININE 1.35*   < > 2.23* 2.69* 3.01*  CALCIUM  8.0*   < > 8.2* 8.3* 8.2*  MG 2.5*  --  2.5*  --  2.5*  PHOS 2.3*  --   --   --  4.8*   < > = values in this interval not displayed.   Liver Function Tests Recent Labs    11/19/23 0304 11/21/23 0535  ALBUMIN  2.0* 2.1*   BNP (last 3 results) Recent Labs    11/03/23 0112 11/07/23 1457 11/13/23 0521  BNP 793.8* 336.4* 1,626.5*   Medications:    Scheduled Medications:  aspirin   81 mg Oral Daily   atorvastatin   40 mg Oral Daily   Chlorhexidine  Gluconate Cloth  6 each Topical Daily   docusate sodium   100 mg Oral BID   feeding supplement  237 mL Oral Q1400   gabapentin   300 mg Oral QHS   heparin  injection (subcutaneous)  5,000 Units Subcutaneous Q8H   insulin  aspart  0-15 Units Subcutaneous Q4H   insulin  glargine-yfgn  6 Units Subcutaneous BID   levothyroxine   150 mcg Oral Q0600   linezolid   600 mg Oral Q12H   multivitamin  1 tablet Oral QHS   nutrition supplement (JUVEN)  1 packet Oral BID BM   oxidized cellulose  1 each Topical Once   pantoprazole   40 mg Oral Daily   sodium chloride  flush  10-40 mL Intracatheter Q12H   Infusions:  DOBUTamine  2.5 mcg/kg/min (11/21/23 0800)   norepinephrine  (LEVOPHED ) Adult infusion 2 mcg/min (11/21/23 0800)   piperacillin -tazobactam (ZOSYN )  IV Stopped (11/21/23 0457)   vasopressin   Stopped (11/18/23 1416)   PRN Medications: acetaminophen , Gerhardt's butt cream, guaiFENesin -dextromethorphan , ipratropium-albuterol , lidocaine , mouth rinse, oxyCODONE , polyethylene glycol, prochlorperazine , senna-docusate, sodium chloride  flush  Assessment/Plan   Acute on chronic HFpEF>Mixed Shock; Cardiogenic/Septic - TEE 6/25: EF 60-65%, RV okay, mild to mod MR, mod to sev AS with mG 27 mmHg and AVA 1.07 cm2 - TTE 11/15/23 LVEF 25% - Taken for [facility] 7/17, however developed flash pulmonary edema. Unable to lie flat for procedure.  - Repeat echo with EF 25-30%, G2DD, mild to mod MR, mod AS  - Minimal UOP off CRRT with high dose diuretics. Some uncharted UOP per pt/RN, although likely not much.  - No indication for CRRT at this time. Continue Lasix  160 mg IV today. Plan to trial iHD tomorrow if does not void. - CVL removed overnight. Unable to check coox/CVP - Stop DBA today - GDMT limited by renal function and hypotension  2. Acute respiratory failure with hypoxia - CXR 7/20 with b/l diffuse airspace opacities, likely pulmonary edema, though given fevers also concern for PNA - Extubated 7/20.  - Improving. Now on [place]. Wean as able    3. CAD - NSTEMI in 4/24. Multivessel CAD. CABG had been recommended but managed medically d/t other active medical issues - Further ischemic eval pending renal function. - continue aspirin  + statin   4. Valvular heart disease - Mild to moderate MR and moderate to severe AS on echo 6/25 - Repeat echo 7/25 with extremely poor windows, based on exam expect AS moderate-severe - Echo 11/20/23 with Vmax 3.21, DVT 0.26, mod to severe AS, mG 22 - However, she is not a TAVR or SAVR candidate currently so even if she has severe AS it would not change current management   5. MRSA bacteremia Necrotizing fasciitis - S/p recent left BKA 6/25; S/p TMA in 4/24  6. Leukocytosis - WBC14.8>15.2>17.5, peaked, now 11 - On linezolid  + zosyn , ending today - KUB with  nonobstructive gas pattern  7. PAD - hx SFA stents - On aspirin  + statin. Had been on plavix  but stopped d/t risk of bleeding.   8. Anemia - s/p 1 u RBC 7/14 - Hx GI bleed from AVMs, ? D/t aortic valve stenosis - hgb 6.5>1u RBC>7.4>6.9>1u RBC>8.4 - fecal occult negative - will need Fe infusions in OP   9. ARF on CKD IIIa - In setting of volume overload/low-output; volume now improved. - Started on CRRT 7/19. Now off 7/22.  - Minimal documented UOP, however incontinent of urine with  stool, unknown amount. Likely very low.  - iHD tomorrow, if unable to remove volume today  Length of Stay: 9  CRITICAL CARE Performed by: [place] Shaydon Lease  Total critical care time: 9 minutes  -Critical care time was exclusive of separately billable procedures and treating other patients. -Critical care was necessary to treat or prevent imminent or life-threatening deterioration. -Critical care was time spent personally by me on the following activities: development of treatment plan with patient and/or surrogate as well as nursing, discussions with consultants, evaluation of patient's response to treatment, examination of patient, obtaining history from patient or surrogate, ordering and performing treatments and interventions, ordering and review of laboratory studies, ordering and review of radiographic studies, pulse oximetry and re-evaluation of patient's condition.  [place] Lion Fernandez, NP  11/21/2023, 8:40 AM  Advanced Heart Failure Team Pager [phone] (M-F; 7a - 5p)  Please contact [facility] for night-coverage after hours (5p -7a ) and weekends on [email or web address]

## 2023-11-21 NOTE — Progress Notes (Signed)
 Patient ID: Kayla Schmitt, female   DOB: 01-14-1966, 58 y.o.   MRN: [id number] [facility] Progress Note   Assessment/ Plan:   1. Acute kidney Injury on chronic kidney disease stage III (baseline creatinine appears 1.3-1.6): Hemodynamically mediated in the setting of acute exacerbation of congestive heart failure, transiently on CRRT that was discontinued on 7/22.  Anuric overnight without response to furosemide . CVP at last check was 6cm H2O and she is about 1L positive from I/O overnight. No acute HD indications noted today and will plan dialysis tomorrow unless acutely needed overnight. She remains on low dose levophed  and dobutamine  that are likely to affect UF goal with conventional HD. We discussed long term outlook.  2.  Acute exacerbation of congestive heart failure/cardiogenic shock: History of congestive heart failure with reduced ejection fraction and decompensation noted during this hospitalization.  Refractory to diuretics in the setting of inotropic/pressor support and started on CRRT transiently for extracorporeal volume unloading.  She appears compensated from a respiratory standpoint and remains on low-dose inotrope/pressor support. 3.  Hyponatremia: Hypervolemic hyponatremia and anticipate will improve with continued ultrafiltration.  Not getting any free water or overtly hypotonic fluids.  Will monitor lab trends. 4.  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  Continue antibiotics per primary service. 5.  Anemia: Secondary to critical illness and prior history of GI bleed.  PRBC transfusion done yesterday with stable H/H overnight.  Subjective:   No acute events overnight.   Objective:   BP 113/65 (BP Location: Left Arm)   Pulse 97   Temp 98.3 F (36.8 C) (Oral)   Resp 19   Ht 5' 4 (1.626 m)   Wt 86.2 kg   SpO2 96%   BMI 32.62 kg/m   Intake/Output Summary (Last 24 hours) at  11/21/2023 9176 Last data filed at 11/21/2023 0800 Gross per 24 hour  Intake 1072.2 ml  Output 50 ml  Net 1022.2 ml   Weight change: -2.3 kg  Physical Exam: Gen: Comfortably resting in bed, on oxygen  via [place]. Mother at bedside.  CVS: Pulse regular rhythm, normal rate S1 and S2 normal Resp: No audible rales/rhonchi at this time, poor inspiratory effort Abd: Soft, obese, nontender, bowel sounds normal Ext: Status post left BKA and right forefoot amputation.  Trace edema over dependent areas..  Imaging: ECHOCARDIOGRAM LIMITED Result Date: 11/20/2023    ECHOCARDIOGRAM LIMITED REPORT   Patient Name:   Kayla Schmitt [facility] Date of Exam: 11/20/2023 Medical Rec #:  [id number]              Height:       64.0 in Accession #:    [id number]             Weight:       195.1 lb Date of Birth:  1965-06-05               BSA:          1.936 m Patient Age:    57 years               BP:           102/66 mmHg Patient Gender: F                      HR:           72 bpm. Exam Location:  Inpatient Procedure: Limited Echo, Limited Color Doppler and Intracardiac  Opacification            Agent (Both Spectral and Color Flow Doppler were utilized during            procedure). Indications:    CHF I50.9  History:        Patient has prior history of Echocardiogram examinations, most                 recent 11/15/2023. CHF, Previous Myocardial Infarction and CAD,                 Signs/Symptoms:Chest Pain; Risk Factors:Hypertension, Diabetes                 and Dyslipidemia.  Sonographer:    Thea Norlander RCS Sonographer#2:  Damien Senior RDCS Referring Phys: [id number] [place] IMPRESSIONS  1. Left ventricular ejection fraction, by estimation, is 25 to 30%. The left ventricle has severely decreased function. The left ventricle demonstrates global hypokinesis. The left ventricular internal cavity size was mildly dilated. Left ventricular diastolic parameters are consistent with Grade II diastolic dysfunction (pseudonormalization).  Elevated left atrial pressure.  2. The mitral valve is abnormal. Mild to moderate mitral valve regurgitation. Moderate mitral annular calcification.  3. There is moderate calcification of the aortic valve. There is moderate thickening of the aortic valve. Moderate to severe aortic valve stenosis. Aortic valve Vmax measures 3.21 m/s.  4. The inferior vena cava is dilated in size with <50% respiratory variability, suggesting right atrial pressure of 15 mmHg. FINDINGS  Left Ventricle: Left ventricular ejection fraction, by estimation, is 25 to 30%. The left ventricle has severely decreased function. The left ventricle demonstrates global hypokinesis. Definity  contrast agent was given IV to delineate the left ventricular endocardial borders. The left ventricular internal cavity size was mildly dilated. Left ventricular diastolic parameters are consistent with Grade II diastolic dysfunction (pseudonormalization). Elevated left atrial pressure. Mitral Valve: The mitral valve is abnormal. Moderate mitral annular calcification. Mild to moderate mitral valve regurgitation. Tricuspid Valve: The tricuspid valve is normal in structure. Tricuspid valve regurgitation is trivial. Aortic Valve: Suspect primarily moderate AS, Vmax 3.24m/s with DVI 0.26. There is moderate calcification of the aortic valve. There is moderate thickening of the aortic valve. Moderate to severe aortic stenosis is present. Aortic valve mean gradient measures 22.0 mmHg. Aortic valve peak gradient measures 41.2 mmHg. Aortic valve area, by VTI measures 0.82 cm. Pulmonic Valve: The pulmonic valve was normal in structure. Pulmonic valve regurgitation is trivial. Venous: The inferior vena cava is dilated in size with less than 50% respiratory variability, suggesting right atrial pressure of 15 mmHg. LEFT VENTRICLE PLAX 2D LVIDd:         5.60 cm   Diastology LVIDs:         5.00 cm   LV e' medial:    6.64 cm/s LV PW:         0.90 cm   LV E/e' medial:  21.4 LV  IVS:        0.80 cm   LV e' lateral:   8.05 cm/s LVOT diam:     1.90 cm   LV E/e' lateral: 17.6 LV SV:         39 LV SV Index:   20 LVOT Area:     2.84 cm  RIGHT VENTRICLE         IVC TAPSE (M-mode): 2.0 cm  IVC diam: 2.70 cm LEFT ATRIUM         Index LA diam:  4.10 cm 2.12 cm/m  AORTIC VALVE AV Area (Vmax):    0.74 cm AV Area (Vmean):   0.96 cm AV Area (VTI):     0.82 cm AV Vmax:           321.00 cm/s AV Vmean:          185.500 cm/s AV VTI:            0.480 m AV Peak Grad:      41.2 mmHg AV Mean Grad:      22.0 mmHg LVOT Vmax:         83.60 cm/s LVOT Vmean:        63.000 cm/s LVOT VTI:          0.138 m LVOT/AV VTI ratio: 0.29  AORTA Ao Root diam: 2.90 cm MITRAL VALVE MV Area (PHT): 4.49 cm     SHUNTS MV Decel Time: 169 msec     Systemic VTI:  0.14 m MV E velocity: 142.00 cm/s  Systemic Diam: 1.90 cm MV A velocity: 84.20 cm/s MV E/A ratio:  1.69 Morene Brownie Electronically signed by Morene Brownie Signature Date/Time: 11/20/2023/5:15:20 PM    Final    DG Abd 1 View Result Date: 11/19/2023 CLINICAL DATA:  Diarrhea. EXAM: ABDOMEN - 1 VIEW COMPARISON:  11/14/2023. FINDINGS: Nonobstructive bowel gas pattern. Scattered gas and stool in the colon. No acute osseous abnormality. IMPRESSION: Nonobstructive bowel gas pattern. Scattered gas and stool in the colon. Electronically Signed   By: Harrietta Sherry M.D.   On: 11/19/2023 12:38    Labs: BMET Recent Labs  Lab 11/16/23 1602 11/17/23 0525 11/17/23 1600 11/18/23 0536 11/18/23 1457 11/19/23 0304 11/19/23 1310 11/20/23 0520 11/20/23 1655 11/21/23 0535  NA 135 135 133* 134* 135 131* 130* 131* 131* 129*  K 4.2 4.2 4.6 4.0 4.2 4.1 3.7 4.0 4.0 3.8  CL 98 99 98 100 101 97* 97* 97* 97* 95*  CO2 21* 23 21* 24 25 22  20* 23 21* 20*  GLUCOSE 213* 166* 214* 134* 164* 140* 167* 126* 106* 128*  BUN 51* 30* 29* 22* 18 14 14  28* 37* 42*  CREATININE 2.88* 0.79 1.77* 1.59* 1.42* 1.35* 1.55* 2.23* 2.69* 3.01*  CALCIUM  8.1* 8.0* 8.4* 8.1* 8.2* 8.0* 7.9*  8.2* 8.3* 8.2*  PHOS 5.7* 3.3 2.7 2.6 2.7 2.3*  --   --   --  4.8*   CBC Recent Labs  Lab 11/18/23 0536 11/18/23 1417 11/19/23 0304 11/20/23 0520 11/20/23 1655 11/21/23 0535  WBC 15.2*   < > 17.5* 13.1* 12.5* 11.8*  NEUTROABS 11.5*  --  12.4* 8.5*  --  7.1  HGB 6.5*   < > 7.4* 6.9* 8.3* 8.4*  HCT 21.3*   < > 23.3* 22.8* 26.1* 26.4*  MCV 91.8   < > 90.7 91.9 89.1 89.5  PLT 488*   < > 486* 357 321 301   < > = values in this interval not displayed.    Medications:     aspirin   81 mg Oral Daily   atorvastatin   40 mg Oral Daily   Chlorhexidine  Gluconate Cloth  6 each Topical Daily   docusate sodium   100 mg Oral BID   feeding supplement  237 mL Oral Q1400   gabapentin   300 mg Oral QHS   heparin  injection (subcutaneous)  5,000 Units Subcutaneous Q8H   insulin  aspart  0-15 Units Subcutaneous Q4H   insulin  glargine-yfgn  6 Units Subcutaneous BID   ipratropium-albuterol   3 mL Nebulization BID  levothyroxine   150 mcg Oral Q0600   linezolid   600 mg Oral Q12H   multivitamin  1 tablet Oral QHS   nutrition supplement (JUVEN)  1 packet Oral BID BM   oxidized cellulose  1 each Topical Once   pantoprazole   40 mg Oral Daily   sodium chloride  flush  10-40 mL Intracatheter Q12H    Gordy Blanch, MD 11/21/2023, 8:23 AM '

## 2023-11-22 DIAGNOSIS — R339 Retention of urine, unspecified: Secondary | ICD-10-CM

## 2023-11-22 LAB — CBC WITH DIFFERENTIAL/PLATELET
Abs Immature Granulocytes: 0.19 K/uL — ABNORMAL HIGH (ref 0.00–0.07)
Basophils Absolute: 0 K/uL (ref 0.0–0.1)
Basophils Relative: 0 %
Eosinophils Absolute: 0.6 K/uL — ABNORMAL HIGH (ref 0.0–0.5)
Eosinophils Relative: 6 %
HCT: 25.2 % — ABNORMAL LOW (ref 36.0–46.0)
Hemoglobin: 8.1 g/dL — ABNORMAL LOW (ref 12.0–15.0)
Immature Granulocytes: 2 %
Lymphocytes Relative: 27 %
Lymphs Abs: 2.6 K/uL (ref 0.7–4.0)
MCH: 28.9 pg (ref 26.0–34.0)
MCHC: 32.1 g/dL (ref 30.0–36.0)
MCV: 90 fL (ref 80.0–100.0)
Monocytes Absolute: 0.6 K/uL (ref 0.1–1.0)
Monocytes Relative: 7 %
Neutro Abs: 5.7 K/uL (ref 1.7–7.7)
Neutrophils Relative %: 58 %
Platelets: 236 K/uL (ref 150–400)
RBC: 2.8 MIL/uL — ABNORMAL LOW (ref 3.87–5.11)
RDW: 17.6 % — ABNORMAL HIGH (ref 11.5–15.5)
WBC: 9.7 K/uL (ref 4.0–10.5)
nRBC: 0.2 % (ref 0.0–0.2)

## 2023-11-22 LAB — COOXEMETRY PANEL
Carboxyhemoglobin: 1.5 % (ref 0.5–1.5)
Methemoglobin: 2.1 % — ABNORMAL HIGH (ref 0.0–1.5)
O2 Saturation: 65.4 %
Total hemoglobin: 8.5 g/dL — ABNORMAL LOW (ref 12.0–16.0)

## 2023-11-22 LAB — GLUCOSE, CAPILLARY
Glucose-Capillary: 116 mg/dL — ABNORMAL HIGH (ref 70–99)
Glucose-Capillary: 149 mg/dL — ABNORMAL HIGH (ref 70–99)
Glucose-Capillary: 158 mg/dL — ABNORMAL HIGH (ref 70–99)
Glucose-Capillary: 128 mg/dL — ABNORMAL HIGH (ref 70–99)
Glucose-Capillary: 118 mg/dL — ABNORMAL HIGH (ref 70–99)
Glucose-Capillary: 136 mg/dL — ABNORMAL HIGH (ref 70–99)

## 2023-11-22 LAB — RENAL FUNCTION PANEL
Albumin: 2.1 g/dL — ABNORMAL LOW (ref 3.5–5.0)
Anion gap: 13 (ref 5–15)
BUN: 61 mg/dL — ABNORMAL HIGH (ref 6–20)
CO2: 22 mmol/L (ref 22–32)
Calcium: 8.2 mg/dL — ABNORMAL LOW (ref 8.9–10.3)
Chloride: 95 mmol/L — ABNORMAL LOW (ref 98–111)
Creatinine, Ser: 2.98 mg/dL — ABNORMAL HIGH (ref 0.44–1.00)
GFR, Estimated: 18 mL/min — ABNORMAL LOW (ref >60)
Glucose, Bld: 121 mg/dL — ABNORMAL HIGH (ref 70–99)
Phosphorus: 4.8 mg/dL — ABNORMAL HIGH (ref 2.5–4.6)
Potassium: 3.5 mmol/L (ref 3.5–5.1)
Sodium: 130 mmol/L — ABNORMAL LOW (ref 135–145)

## 2023-11-22 LAB — MAGNESIUM: Magnesium: 2.3 mg/dL (ref 1.7–2.4)

## 2023-11-22 MED ORDER — ALBUMIN HUMAN 25 % IV SOLN
12.5000 g | Freq: Once | INTRAVENOUS | Status: AC
  Administered 2023-11-22: 12.5 g via INTRAVENOUS
  Filled 2023-11-22: qty 50

## 2023-11-22 MED ORDER — BETHANECHOL CHLORIDE 10 MG PO TABS
10.0000 mg | ORAL_TABLET | Freq: Three times a day (TID) | ORAL | Status: DC
  Administered 2023-11-22 – 2023-12-06 (×38): 10 mg via ORAL
  Filled 2023-11-22 (×45): qty 1

## 2023-11-22 MED ORDER — INSULIN ASPART 100 UNIT/ML IJ SOLN
0.0000 [IU] | Freq: Three times a day (TID) | INTRAMUSCULAR | Status: DC
  Administered 2023-11-22: 2 [IU] via SUBCUTANEOUS
  Administered 2023-11-23: 3 [IU] via SUBCUTANEOUS
  Administered 2023-11-23: 2 [IU] via SUBCUTANEOUS
  Administered 2023-11-24 – 2023-11-25 (×4): 3 [IU] via SUBCUTANEOUS
  Administered 2023-11-25: 5 [IU] via SUBCUTANEOUS
  Administered 2023-11-26 – 2023-11-28 (×7): 3 [IU] via SUBCUTANEOUS
  Administered 2023-11-28 – 2023-11-29 (×2): 5 [IU] via SUBCUTANEOUS
  Administered 2023-11-29 (×2): 3 [IU] via SUBCUTANEOUS
  Administered 2023-11-30: 2 [IU] via SUBCUTANEOUS

## 2023-11-22 MED ORDER — POTASSIUM CHLORIDE CRYS ER 20 MEQ PO TBCR
20.0000 meq | EXTENDED_RELEASE_TABLET | Freq: Once | ORAL | Status: AC
  Administered 2023-11-22: 20 meq via ORAL
  Filled 2023-11-22: qty 1

## 2023-11-22 MED ORDER — FUROSEMIDE 10 MG/ML IJ SOLN
80.0000 mg | Freq: Once | INTRAMUSCULAR | Status: AC
  Administered 2023-11-22: 80 mg via INTRAVENOUS
  Filled 2023-11-22: qty 8

## 2023-11-22 NOTE — TOC Progression Note (Signed)
 Transition of Care [facility]) - Progression Note    Patient Details  Name: Lilana Blasko MRN: [id number] Date of Birth: 1966/03/27  Transition of Care [facility] CM/SW Contact  Justina Delcia Czar, RN Phone Number: [phone] 11/22/2023, 1:24 PM  Clinical Narrative:     Spoke to pt and mother at bedside. Pt states she wants to go to IP rehab. CIR following for medical readiness.     Barriers to Discharge: Continued Medical Work up    Expected Discharge Plan and Services   Discharge Planning Services: CM Consult Post Acute Care Choice: Home Health     Social Drivers of Health (SDOH) Interventions SDOH Screenings   Food Insecurity: No Food Insecurity (11/14/2023)  Housing: Low Risk  (11/14/2023)  Transportation Needs: No Transportation Needs (11/14/2023)  Utilities: Not At Risk (11/14/2023)  Financial Resource Strain: Low Risk  (10/19/2023)   Received from [facility]  Tobacco Use: Medium Risk (11/15/2023)    Readmission Risk Interventions    11/14/2023   10:15 AM 08/14/2022    4:37 PM  Readmission Risk Prevention Plan  Post Dischage Appt  Complete  Medication Screening  Complete  Transportation Screening Complete Complete  Medication Review (RN Care Manager) Complete   HRI or Home Care Consult Complete   Palliative Care Screening Not Applicable   Skilled Nursing Facility Not Applicable

## 2023-11-22 NOTE — Progress Notes (Signed)
 Inpatient Rehab Admissions Coordinator:  Awaiting medical readiness. Will continue to follow.   Wolfgang Phoenix, MS, CCC-SLP Admissions Coordinator [phone]

## 2023-11-22 NOTE — Progress Notes (Signed)
 Patient ID: Kayla Schmitt, female   DOB: 03-03-1966, 58 y.o.   MRN: [id number] [facility] Progress Note   Assessment/ Plan:   1. Acute kidney Injury on chronic kidney disease stage III (baseline creatinine appears 1.3-1.6): Hemodynamically mediated in the setting of acute exacerbation of congestive heart failure, transiently on CRRT that was discontinued on 7/22.  Weaned off of pressors/inotropes overnight.  Yesterday had urethral catheter placement with increased urine output after furosemide  challenge.  Will redose diuretics for the next 24 hours.  No indication for dialysis/CRRT. 2.  Acute exacerbation of congestive heart failure/cardiogenic shock: History of congestive heart failure with reduced ejection fraction and decompensation noted during this hospitalization.  Refractory to diuretics in the setting of inotropic/pressor support and started on CRRT transiently for extracorporeal volume unloading.  Some intermittent orthopnea/dyspnea overnight; redose furosemide . 3.  Hyponatremia: Hypervolemic hyponatremia and anticipate will improve with continued ultrafiltration.  Monitor with diuresis. 4.  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  Continue antibiotics per primary service. 5.  Anemia: Secondary to critical illness and prior history of GI bleed.  PRBC transfusion done yesterday with stable H/H overnight.  Subjective:   She had some shortness of breath overnight.  Improved urine output status post Foley catheter placement.   Objective:   BP (!) 84/55   Pulse 91   Temp 98 F (36.7 C) (Oral)   Resp 16   Ht 5' 4 (1.626 m)   Wt 90 kg   SpO2 93%   BMI 34.06 kg/m   Intake/Output Summary (Last 24 hours) at 11/22/2023 0740 Last data filed at 11/22/2023 0600 Gross per 24 hour  Intake 752.02 ml  Output 1675 ml  Net -922.98 ml   Weight change: 3.8 kg  Physical Exam: Gen: Comfortably  resting in bed. Mother at bedside.  CVS: Pulse regular rhythm, normal rate S1 and S2 normal Resp: No audible rales/rhonchi at this time, poor inspiratory effort Abd: Soft, obese, nontender, bowel sounds normal Ext: Status post left BKA and right forefoot amputation.  Trace edema over dependent areas..  Imaging: ECHOCARDIOGRAM LIMITED Result Date: 11/20/2023    ECHOCARDIOGRAM LIMITED REPORT   Patient Name:   Kayla Schmitt [facility] Date of Exam: 11/20/2023 Medical Rec #:  [id number]              Height:       64.0 in Accession #:    [id number]             Weight:       195.1 lb Date of Birth:  Sep 20, 1965               BSA:          1.936 m Patient Age:    57 years               BP:           102/66 mmHg Patient Gender: F                      HR:           72 bpm. Exam Location:  Inpatient Procedure: Limited Echo, Limited Color Doppler and Intracardiac Opacification            Agent (Both Spectral and Color Flow Doppler were utilized during            procedure). Indications:    CHF I50.9  History:  Patient has prior history of Echocardiogram examinations, most                 recent 11/15/2023. CHF, Previous Myocardial Infarction and CAD,                 Signs/Symptoms:Chest Pain; Risk Factors:Hypertension, Diabetes                 and Dyslipidemia.  Sonographer:    Thea Norlander RCS Sonographer#2:  Damien Senior RDCS Referring Phys: [id number] [place] IMPRESSIONS  1. Left ventricular ejection fraction, by estimation, is 25 to 30%. The left ventricle has severely decreased function. The left ventricle demonstrates global hypokinesis. The left ventricular internal cavity size was mildly dilated. Left ventricular diastolic parameters are consistent with Grade II diastolic dysfunction (pseudonormalization). Elevated left atrial pressure.  2. The mitral valve is abnormal. Mild to moderate mitral valve regurgitation. Moderate mitral annular calcification.  3. There is moderate calcification of the aortic valve.  There is moderate thickening of the aortic valve. Moderate to severe aortic valve stenosis. Aortic valve Vmax measures 3.21 m/s.  4. The inferior vena cava is dilated in size with <50% respiratory variability, suggesting right atrial pressure of 15 mmHg. FINDINGS  Left Ventricle: Left ventricular ejection fraction, by estimation, is 25 to 30%. The left ventricle has severely decreased function. The left ventricle demonstrates global hypokinesis. Definity  contrast agent was given IV to delineate the left ventricular endocardial borders. The left ventricular internal cavity size was mildly dilated. Left ventricular diastolic parameters are consistent with Grade II diastolic dysfunction (pseudonormalization). Elevated left atrial pressure. Mitral Valve: The mitral valve is abnormal. Moderate mitral annular calcification. Mild to moderate mitral valve regurgitation. Tricuspid Valve: The tricuspid valve is normal in structure. Tricuspid valve regurgitation is trivial. Aortic Valve: Suspect primarily moderate AS, Vmax 3.75m/s with DVI 0.26. There is moderate calcification of the aortic valve. There is moderate thickening of the aortic valve. Moderate to severe aortic stenosis is present. Aortic valve mean gradient measures 22.0 mmHg. Aortic valve peak gradient measures 41.2 mmHg. Aortic valve area, by VTI measures 0.82 cm. Pulmonic Valve: The pulmonic valve was normal in structure. Pulmonic valve regurgitation is trivial. Venous: The inferior vena cava is dilated in size with less than 50% respiratory variability, suggesting right atrial pressure of 15 mmHg. LEFT VENTRICLE PLAX 2D LVIDd:         5.60 cm   Diastology LVIDs:         5.00 cm   LV e' medial:    6.64 cm/s LV PW:         0.90 cm   LV E/e' medial:  21.4 LV IVS:        0.80 cm   LV e' lateral:   8.05 cm/s LVOT diam:     1.90 cm   LV E/e' lateral: 17.6 LV SV:         39 LV SV Index:   20 LVOT Area:     2.84 cm  RIGHT VENTRICLE         IVC TAPSE (M-mode): 2.0 cm   IVC diam: 2.70 cm LEFT ATRIUM         Index LA diam:    4.10 cm 2.12 cm/m  AORTIC VALVE AV Area (Vmax):    0.74 cm AV Area (Vmean):   0.96 cm AV Area (VTI):     0.82 cm AV Vmax:           321.00 cm/s AV Vmean:  185.500 cm/s AV VTI:            0.480 m AV Peak Grad:      41.2 mmHg AV Mean Grad:      22.0 mmHg LVOT Vmax:         83.60 cm/s LVOT Vmean:        63.000 cm/s LVOT VTI:          0.138 m LVOT/AV VTI ratio: 0.29  AORTA Ao Root diam: 2.90 cm MITRAL VALVE MV Area (PHT): 4.49 cm     SHUNTS MV Decel Time: 169 msec     Systemic VTI:  0.14 m MV E velocity: 142.00 cm/s  Systemic Diam: 1.90 cm MV A velocity: 84.20 cm/s MV E/A ratio:  1.69 Morene Brownie Electronically signed by Morene Brownie Signature Date/Time: 11/20/2023/5:15:20 PM    Final     Labs: BMET Recent Labs  Lab 11/17/23 0525 11/17/23 1600 11/18/23 0536 11/18/23 1457 11/19/23 0304 11/19/23 1310 11/20/23 0520 11/20/23 1655 11/21/23 0535 11/22/23 0512  NA 135 133* 134* 135 131* 130* 131* 131* 129* 130*  K 4.2 4.6 4.0 4.2 4.1 3.7 4.0 4.0 3.8 3.5  CL 99 98 100 101 97* 97* 97* 97* 95* 95*  CO2 23 21* 24 25 22  20* 23 21* 20* 22  GLUCOSE 166* 214* 134* 164* 140* 167* 126* 106* 128* 121*  BUN 30* 29* 22* 18 14 14  28* 37* 42* 61*  CREATININE 0.79 1.77* 1.59* 1.42* 1.35* 1.55* 2.23* 2.69* 3.01* 2.98*  CALCIUM  8.0* 8.4* 8.1* 8.2* 8.0* 7.9* 8.2* 8.3* 8.2* 8.2*  PHOS 3.3 2.7 2.6 2.7 2.3*  --   --   --  4.8* 4.8*   CBC Recent Labs  Lab 11/19/23 0304 11/20/23 0520 11/20/23 1655 11/21/23 0535 11/22/23 0512  WBC 17.5* 13.1* 12.5* 11.8* 9.7  NEUTROABS 12.4* 8.5*  --  7.1 5.7  HGB 7.4* 6.9* 8.3* 8.4* 8.1*  HCT 23.3* 22.8* 26.1* 26.4* 25.2*  MCV 90.7 91.9 89.1 89.5 90.0  PLT 486* 357 321 301 236    Medications:     aspirin   81 mg Oral Daily   atorvastatin   40 mg Oral Daily   Chlorhexidine  Gluconate Cloth  6 each Topical Daily   docusate sodium   100 mg Oral BID   feeding supplement  237 mL Oral Q1400   furosemide    80 mg Intravenous Once   furosemide   80 mg Intravenous Once   furosemide   80 mg Intravenous Once   gabapentin   300 mg Oral QHS   heparin  injection (subcutaneous)  5,000 Units Subcutaneous Q8H   insulin  aspart  0-15 Units Subcutaneous Q4H   insulin  glargine-yfgn  6 Units Subcutaneous BID   levothyroxine   150 mcg Oral Q0600   multivitamin  1 tablet Oral QHS   nutrition supplement (JUVEN)  1 packet Oral BID BM   oxidized cellulose  1 each Topical Once   pantoprazole   40 mg Oral Daily   sodium chloride  flush  10-40 mL Intracatheter Q12H    Gordy Blanch, MD 11/22/2023, 7:40 AM '

## 2023-11-22 NOTE — Progress Notes (Signed)
 Physical Therapy Treatment Patient Details Name: Kayla Schmitt MRN: [id number] DOB: April 07, 1966 Today's Date: 11/22/2023   History of Present Illness 58 y.o female admitted 7/15 from [facility] for SOB, CHF exacerbation. 7/17 attempted RHC but couldn't lie flat with pulmonary edema and decompensation requiring intubation. 7/19 CRRT. 7/20 extubation. PMHx: admission 6/22-7/1 for LLE pain, sepsis, necrotizing fasciitis. 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 CAD, aortic stenosis, HTN, CHF, PAD, bil transmet amputation, HLD, hypothyroidsim, T2DM    PT Comments  Pt admitted with above diagnosis. Pt was able to stand to [facility] x 3 and tolerated standing longer each time. Pt progressing and tolerating more therapy each session.  Pt very motivated as well.  Continue to recommend post acute rehab > 3 hours day.  Pt currently with functional limitations due to the deficits listed below (see PT Problem List). Pt will benefit from acute skilled PT to increase their independence and safety with mobility to allow discharge.       If plan is discharge home, recommend the following: A lot of help with walking and/or transfers;A lot of help with bathing/dressing/bathroom;Assistance with cooking/housework;Assist for transportation;Help with stairs or ramp for entrance   Can travel by private vehicle        Equipment Recommendations  None recommended by PT    Recommendations for Other Services OT consult;Rehab consult     Precautions / Restrictions Precautions Precautions: Fall;Other (comment) Recall of Precautions/Restrictions: Impaired Precaution/Restrictions Comments: R transmet amputation, L BKA Required Braces or Orthoses: Other Brace Other Brace: Limb guard Restrictions Weight Bearing Restrictions Per Provider Order: Yes LLE Weight Bearing Per Provider Order: Non weight bearing     Mobility  Bed Mobility Overal bed mobility: Needs Assistance Bed Mobility: Supine to Sit Rolling: Min assist   Supine to sit: Min  assist, +2 for safety/equipment, HOB elevated, Used rails     General bed mobility comments: Min A x 2 with use of bed pad to scoot EOB and light assist to lift trunk. good initiation from pt    Transfers Overall transfer level: Needs assistance Equipment used: Ambulation equipment used Transfers: Sit to/from Stand, Bed to chair/wheelchair/BSC Sit to Stand: Mod assist, +2 physical assistance, +2 safety/equipment           General transfer comment: Mod A x 2 to stand from bedside into Stedy due to lower height and pt has difficulty initiating movement and needs verbal and tactile cues to stand initially. good initiation but required +2 to lift bottom fully upright d/t pt scooting forward. Use of Stedy for transfer to chair. Pt was able to stand 30 seconds.  Then sat on STedy and rested.  SEcond attempt from Stedy min assist of 2 to stand and stood 2 more times - 1 min and last attempt was 1 min 20 seconds.  Maxisky lift pad in Materials engineer: Stedy  Ambulation/Gait                   Stairs             Wheelchair Mobility     Tilt Bed    Modified Rankin (Stroke Patients Only)       Balance Overall balance assessment: Needs assistance Sitting-balance support: No upper extremity supported, Feet unsupported Sitting balance-Leahy Scale: Fair Sitting balance - Comments: min assist with progression to CGA EOB   Standing balance support: Bilateral upper extremity supported, Reliant on assistive device for balance, During functional activity Standing balance-Leahy Scale: Poor Standing balance comment: relies on  Stedy for support and +2 mod assist                            Communication Communication Communication: No apparent difficulties  Cognition Arousal: Alert Behavior During Therapy: WFL for tasks assessed/performed                           PT - Cognition Comments: pt oriented to self, year and place. decreased  attention with cues to attend to task, slow processing and response Following commands: Impaired Following commands impaired: Only follows one step commands consistently, Follows multi-step commands inconsistently    Cueing Cueing Techniques: Verbal cues, Gestural cues, Tactile cues  Exercises General Exercises - Lower Extremity Long Arc Quad: AROM, Right, 5 reps, Seated    General Comments General comments (skin integrity, edema, etc.): BP WFL, SpO2 WFL on 1L      Pertinent Vitals/Pain Pain Assessment Pain Assessment: No/denies pain Breathing: normal Negative Vocalization: none Facial Expression: smiling or inexpressive Body Language: relaxed Consolability: no need to console PAINAD Score: 0    Home Living                          Prior Function            PT Goals (current goals can now be found in the care plan section) Progress towards PT goals: Progressing toward goals    Frequency    Min 2X/week      PT Plan      Co-evaluation PT/OT/SLP Co-Evaluation/Treatment: Yes Reason for Co-Treatment: For patient/therapist safety;To address functional/ADL transfers PT goals addressed during session: Mobility/safety with mobility        AM-PAC PT 6 Clicks Mobility   Outcome Measure  Help needed turning from your back to your side while in a flat bed without using bedrails?: A Little Help needed moving from lying on your back to sitting on the side of a flat bed without using bedrails?: A Lot Help needed moving to and from a bed to a chair (including a wheelchair)?: Total Help needed standing up from a chair using your arms (e.g., wheelchair or bedside chair)?: Total Help needed to walk in hospital room?: Total Help needed climbing 3-5 steps with a railing? : Total 6 Click Score: 9    End of Session Equipment Utilized During Treatment: Gait belt;Oxygen  Activity Tolerance: Patient limited by fatigue Patient left: with call bell/phone within reach;in  chair;with chair alarm set;with family/visitor present Nurse Communication: Mobility status;Need for lift equipment PT Visit Diagnosis: Unsteadiness on feet (R26.81);Other abnormalities of gait and mobility (R26.89);Muscle weakness (generalized) (M62.81);Difficulty in walking, not elsewhere classified (R26.2)     Time: 1003-1030 PT Time Calculation (min) (ACUTE ONLY): 27 min  Charges:    $Therapeutic Activity: 8-22 mins PT General Charges $$ ACUTE PT VISIT: 1 Visit                     Martika Egler M,PT Acute Rehab Services [phone]    [name] 11/22/2023, 11:22 AM

## 2023-11-22 NOTE — Progress Notes (Signed)
 NAME:  Adline Kirshenbaum, MRN:  [id number], DOB:  04/05/1966, LOS: 10 ADMISSION DATE:  11/12/2023, CONSULTATION DATE:  11/14/23 REFERRING MD:  Bensihmon, CHIEF COMPLAINT:  SOB   History of Present Illness:  58 year old woman with ischemic cardiomyopathy, aortic stenosis, recent nec fasc s/p BKA 6/25 who was in CIR then developed worsening SOB so admitted back to [facility].  Stubborn diuresis so attempted RHC today but unable to lie flat.  CXR showing pulmonary edema, bringing to ICU for inotropes and BIPAP.  Full ROS as below, understandably anxious.  Pertinent  Medical History  CAD Aortic stenosis Class 3 obesity Nec fasc Hypothyroidism PVD DM2 GERD Anemia  Significant Hospital Events: Including procedures, antibiotic start and stop dates in addition to other pertinent events   7/16 admission 7/17 ICU, intubated 7/18 started spiking fever with Tmax 101.5, tachycardic.  X-ray chest still showing bilateral infiltrate but FiO2 is coming down.  Lasix  infusion increased to 30 mg/h 7/19 started on CRRT, tolerating spontaneous breathing trial 7/20 extubated 7/22 CRRT holiday, Levophed /dobutamine  7/23 lasix  challenge, transfused 1u PRBC, remains low dose NE, DBA 2.5, CVP 7 7/24 laisx, off DBA  Interim History / Subjective:  Foley placed yest, diuresed 1.6L with lasix  yest.  Stable renal indices C/o of intermittent periods of brief SOB after eating, better after belching and overnight while sleeping.  Remains on 1L [place]  Off pressors/ DBA since yest  Objective    Blood pressure (!) 84/55, pulse 77, temperature 98.3 F (36.8 C), temperature source Oral, resp. rate 12, height 5' 4 (1.626 m), weight 90 kg, SpO2 94%.        Intake/Output Summary (Last 24 hours) at 11/22/2023 0708 Last data filed at 11/22/2023 0600 Gross per 24 hour  Intake 752.02 ml  Output 1675 ml  Net -922.98 ml   Filed Weights   11/20/23 0500 11/21/23 0500 11/22/23 0500  Weight: 88.5 kg 86.2 kg 90 kg    Examination: General:  pleasant adult female sitting up in bed in NAD HEENT: MM pink/moist Neuro: Aox4, MAE CV: rr, SR, +murmur, R ax aline/ R internal jugular trialysis  PULM:  non labored, clear anteriorly, diminished with few posterior bibasilar rales  GI: obese, +BS, foley- dk amber urine Extremities: warm/dry, no LE edema, L BKA- with wrap, prior R transmetatarsal amputation Skin: no rashes   CVP 9-10 [place] 65% off pressors/ DBA Afebrile UOP 50ml >1.6 L/ 24hrs! -510 ml   Labs> WBC 11.8> 9.7, stable H/H, Na 129> 130, K 3.5, BUN/ sCr 42/ 3.01> 61/ 2.98, Mag 2.3, bicarb 22        Patient Lines/Drains/Airways Status     Active Line/Drains/Airways     Name Placement date Placement time Site Days   Arterial Line 11/16/23 Right Brachial 11/16/23  1206  Brachial  2   PICC Single Lumen 10/26/23 Left Basilic 46 cm 1 cm 10/26/23  8788  Basilic  23   CVC Triple Lumen 11/14/23 Left Subclavian 11/14/23  1700  -- 4   Hemodialysis Catheter Right Internal jugular Triple lumen Temporary (Non-Tunneled) 11/16/23  1205  Internal jugular  2   Urethral Catheter Andie RN Temperature probe 14 Fr. 11/14/23  1700  Temperature probe  4   Fecal Management System 38 mL 11/16/23  0454  -- 2   Wound 10/22/23 1500 Surgical Open Surgical Incision Other (Comment) Left;Proximal 10/22/23  1500  Other (Comment)  27   Wound 10/29/23 1410 Other (Comment) Thigh Distal;Left;Posterior 10/29/23  1410  Thigh  20        Resolved problem list  Hypervolemic hyponatremia/hyperphosphatemia  Assessment and Plan  Acute on chronic HFrEF with cardiogenic shock  Multivessel coronary artery disease with recent acute NSTEMI Severe aortic stenosis Mild to moderate MR Peripheral arterial disease HTN - 11/15/23 EF 25% - repeat echo 7/23> EF 25-30%, G2DD, mild to mod MR, mod-severe AS P:  - per AHF - off NE/ DBA 7/24 - goal MAP > 65 - good coox today 65%/ trend CVP - ongoing lasix  challenge today  - ASA/ statin -  GDMT limited by hypotension/ AKI - further ischemic evaluation pending renal recovery/ function - not a TAVR or SAVR candidate per cards  Acute respiratory failure with hypoxia and hypercapnia due to pulmonary edema and possible aspiration pneumonia-improving  - extubated 7/20 - cont to wean supplemental O2 for sat goal > 92% - diurese as above - pulm hygiene, PT/ OT, pt ready to cont mobilize OOB to chair - completed 7 days of abx therapy 7/24> cultures remains neg  Recent MRSA bacteremia and 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 - s/p coarse of daptomycin  and completed empiric 7 day course of abx - linezolid / zosyn  7/24 - remains afebrile, trend clinically   Hypothyroidism - synthroid   AKI on CKD stage IIIa, likely due to cardiorenal syndrome Hypervolemic hyponatremia Urinary retention - per nephrology  - CRRT holiday 7/22.  S/p lasix  challenge 7/23 with minimal UOP.  Foley placed 7/24 with out, since with good UOP/ stable sCr to lasix  yest.  Plans for albumin / lasix  today. No needs for dialysis today - start retention meds - trend renal indices  - strict I/Os, daily wts - avoid nephrotoxins, renal dose meds, hemodynamic support as above  Anemia  - s/p 1u PRBC 7/23 - H/H remains stable, no signs of bleeding - trend CBC, transfuse per protocol   Diabetes type 2, complicated with vasculopathy - cont CBG q4, prn mSSI, semglee  6 units BID  Obesity, BMI 33 - continue heart healthy/ carb modified diet - OP weight loss counseling   Best Practice (right click and Reselect all SmartList Selections daily)   Diet/type: heart healthy/ carb modified DVT prophylaxis prophylactic heparin   Pressure ulcer(s): Please see nursing notes GI prophylaxis: PPI Lines: Central line> HD catheter, Aline (likely d/c soon) Foley:  Yes, and it is still needed Code Status:  full code Last date of multidisciplinary goals of care discussion [7/22 updated mother at  bedside    Pt/ mother updated at bedside 7/25  CCT: 32 mins  Lyle Pesa, MSN, AG-ACNP-BC [facility] 11/22/2023, 7:08 AM  See Amion for pager If no response to pager , please call [phone] until 7pm After 7:00 pm call Elink  [phone]

## 2023-11-22 NOTE — Progress Notes (Signed)
 Advanced Heart Failure Rounding Note  Cardiologist: Vishnu SHAUNNA Maywood, MD  [facility] Consulting MD: Dr. Cherrie Chief Complaint: Acute on chronic HFpEF / RV Failure Patient Profile   58 y.o. female with history of PAD with prior SFA stents and bilateral TMA, severe 3v CAD, MR/AS, DM II, anemia, HTN, HLD, hypothyroidism.  Admitted in 6/25 with MRSA bactermia and necrotizing fasciitis s/p left BKA. Transferred back to [facility] from rehab d/t acute on chronic CHF with concern for low-output and acute respiratory failure.  Subjective:    7/18: aborted cath for acute respiratory distress; intubated; DBA started 7/19: started on CRRT 7/20: extubated 7/21: hgb 6.5 s/p 1u RBCs 7/22: CRRT holiday today  Doing well with lasix  challenge. -1.6L UOP sCr 2.69>3.01>2.98; K 3.5  CVP 5/7  Feels great this morning. Family at the bedside.   Objective:    Weight Range: 90 kg Body mass index is 34.06 kg/m.   Vital Signs:   Temp:  [97.6 F (36.4 C)-98.3 F (36.8 C)] 98 F (36.7 C) (07/25 0700) Pulse Rate:  [77-97] 91 (07/25 0700) Resp:  [12-37] 16 (07/25 0700) BP: (84-113)/(53-72) 84/55 (07/25 0600) SpO2:  [90 %-97 %] 93 % (07/25 0700) Arterial Line BP: (84-135)/(45-93) 117/66 (07/25 0700) Weight:  [90 kg] 90 kg (07/25 0500) Last BM Date : 11/21/23  Weight change: Filed Weights   11/20/23 0500 11/21/23 0500 11/22/23 0500  Weight: 88.5 kg 86.2 kg 90 kg   Intake/Output:  Intake/Output Summary (Last 24 hours) at 11/22/2023 0746 Last data filed at 11/22/2023 0600 Gross per 24 hour  Intake 752.02 ml  Output 1675 ml  Net -922.98 ml    Physical Exam    General:  chronically ill appearing.  Neck: JVD ~6 cm.  Cor: Regular rate & rhythm. No murmurs. Lungs: clear Extremities: no edema. + L BKA Neuro: alert & oriented x 3. Affect pleasant.   Telemetry   SR 80s, occasional PVCs (personally reviewed)  Labs    CBC Recent Labs    11/21/23 0535 11/22/23 0512  WBC 11.8* 9.7   NEUTROABS 7.1 5.7  HGB 8.4* 8.1*  HCT 26.4* 25.2*  MCV 89.5 90.0  PLT 301 236   Basic Metabolic Panel Recent Labs    92/75/74 0535 11/22/23 0512  NA 129* 130*  K 3.8 3.5  CL 95* 95*  CO2 20* 22  GLUCOSE 128* 121*  BUN 42* 61*  CREATININE 3.01* 2.98*  CALCIUM  8.2* 8.2*  MG 2.5* 2.3  PHOS 4.8* 4.8*   Liver Function Tests Recent Labs    11/21/23 0535 11/22/23 0512  ALBUMIN  2.1* 2.1*   BNP (last 3 results) Recent Labs    11/03/23 0112 11/07/23 1457 11/13/23 0521  BNP 793.8* 336.4* 1,626.5*   Medications:    Scheduled Medications:  aspirin   81 mg Oral Daily   atorvastatin   40 mg Oral Daily   Chlorhexidine  Gluconate Cloth  6 each Topical Daily   docusate sodium   100 mg Oral BID   feeding supplement  237 mL Oral Q1400   furosemide   80 mg Intravenous Once   furosemide   80 mg Intravenous Once   furosemide   80 mg Intravenous Once   gabapentin   300 mg Oral QHS   heparin  injection (subcutaneous)  5,000 Units Subcutaneous Q8H   insulin  aspart  0-15 Units Subcutaneous Q4H   insulin  glargine-yfgn  6 Units Subcutaneous BID   levothyroxine   150 mcg Oral Q0600   multivitamin  1 tablet Oral QHS   nutrition supplement (  JUVEN)  1 packet Oral BID BM   oxidized cellulose  1 each Topical Once   pantoprazole   40 mg Oral Daily   sodium chloride  flush  10-40 mL Intracatheter Q12H   Infusions:  albumin  human     albumin  human     albumin  human     norepinephrine  (LEVOPHED ) Adult infusion Stopped (11/21/23 1335)   PRN Medications: acetaminophen , Gerhardt's butt cream, guaiFENesin -dextromethorphan , ipratropium-albuterol , lidocaine , mouth rinse, oxyCODONE , polyethylene glycol, prochlorperazine , senna-docusate, sodium chloride  flush  Assessment/Plan  Acute on chronic HFpEF>Mixed Shock; Cardiogenic/Septic - TEE 6/25: EF 60-65%, RV okay, mild to mod MR, mod to sev AS with mG 27 mmHg and AVA 1.07 cm2 - TTE 11/15/23 LVEF 25% - Taken for [facility] 7/17, however developed flash pulmonary  edema. Unable to lie flat for procedure.  - Repeat echo with EF 25-30%, G2DD, mild to mod MR, mod AS  - Doing well with lasix  challenge. -1.6L UOP. Per nephrology plan to repeat today. No indication for CRRT at this time. Continue Lasix  160 mg IV today. CVP 5/7. Avoid over diuresis.  - Stable off DBA - GDMT limited by renal function and hypotension  2. Acute respiratory failure with hypoxia - CXR 7/20 with b/l diffuse airspace opacities, likely pulmonary edema, though given fevers also concern for PNA - Extubated 7/20.  - Improving. Now on [place]. Wean as able    3. CAD - NSTEMI in 4/24. Multivessel CAD. CABG had been recommended but managed medically d/t other active medical issues - Further ischemic eval pending renal function. - continue aspirin  + statin   4. Valvular heart disease - Mild to moderate MR and moderate to severe AS on echo 6/25 - Repeat echo 7/25 with extremely poor windows, based on exam expect AS moderate-severe - Echo 11/20/23 with Vmax 3.21, DVT 0.26, mod to severe AS, mG 22 - However, she is not a TAVR or SAVR candidate currently so even if she has severe AS it would not change current management   5. MRSA bacteremia Necrotizing fasciitis - S/p recent left BKA 6/25; S/p TMA in 4/24  6. Leukocytosis - WBC14.8>15.2>17.5, peaked, now resolved - On linezolid  + zosyn , ending today - KUB with nonobstructive gas pattern  7. PAD - hx SFA stents - On aspirin  + statin. Had been on plavix  but stopped d/t risk of bleeding.   8. Anemia - s/p 1 u RBC 7/14 - Hx GI bleed from AVMs, ? D/t aortic valve stenosis - hgb 6.5>1u RBC>7.4>6.9>1u RBC>8.4>8.1 - fecal occult negative - will need Fe infusions in OP   9. ARF on CKD IIIa - In setting of volume overload/low-output; volume now improved. - Started on CRRT 7/19. Now off 7/22.  - Now with foley. UOP picking up with lasix  challenge.    Length of Stay: 10  CRITICAL CARE Performed by: Beckey LITTIE Coe  Total critical care  time: 10 minutes  -Critical care time was exclusive of separately billable procedures and treating other patients. -Critical care was necessary to treat or prevent imminent or life-threatening deterioration. -Critical care was time spent personally by me on the following activities: development of treatment plan with patient and/or surrogate as well as nursing, discussions with consultants, evaluation of patient's response to treatment, examination of patient, obtaining history from patient or surrogate, ordering and performing treatments and interventions, ordering and review of laboratory studies, ordering and review of radiographic studies, pulse oximetry and re-evaluation of patient's condition.  Beckey LITTIE Coe, NP  11/22/2023, 7:46 AM  Advanced  Heart Failure Team Pager [phone] (M-F; 7a - 5p)  Please contact [facility] for night-coverage after hours (5p -7a ) and weekends on [email or web address]

## 2023-11-22 NOTE — Progress Notes (Signed)
 Occupational Therapy Treatment Patient Details Name: Sharma Lawrance MRN: [id number] DOB: 07/29/1965 Today's Date: 11/22/2023   History of present illness 58 y.o female admitted 7/15 from [facility] for SOB, CHF exacerbation. 7/17 attempted RHC but couldn't lie flat with pulmonary edema and decompensation requiring intubation. 7/19 CRRT. 7/20 extubation. PMHx: admission 6/22-7/1 for LLE pain, sepsis, necrotizing fasciitis. 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 CAD, aortic stenosis, HTN, CHF, PAD, bil transmet amputation, HLD, hypothyroidsim, T2DM   OT comments  Pt making excellent progress towards OT goals this session. Focused on activity tolerance and standing position in stedy while maintaining balance for functional ADL tasks.  Pt demonstrated dynamic reaching in standing with stedy. Min A for bed mobility mod A +2 for sit<>stand transfers with assist in stedy. She progressed from 30 seconds in standing to 1 min 20 seconds in standing. After transfer to recliner she participated in seated grooming tasks. OT will continue to follow acutely and it is essential that she return to post-acute rehab of >3 hours daily to maximize safety and independence in ADL and functional transfers.       If plan is discharge home, recommend the following:  A lot of help with walking and/or transfers;A lot of help with bathing/dressing/bathroom   Equipment Recommendations  Other (comment)    Recommendations for Other Services      Precautions / Restrictions Precautions Precautions: Fall;Other (comment) Recall of Precautions/Restrictions: Impaired Precaution/Restrictions Comments: R transmet amputation, L BKA Required Braces or Orthoses: Other Brace Other Brace: Limb guard Restrictions Weight Bearing Restrictions Per Provider Order: Yes LLE Weight Bearing Per Provider Order: Non weight bearing       Mobility Bed Mobility Overal bed mobility: Needs Assistance Bed Mobility: Supine to Sit Rolling: Min assist   Supine to sit:  Min assist, +2 for safety/equipment, HOB elevated, Used rails     General bed mobility comments: Min A x 2 with use of bed pad to scoot EOB and light assist to lift trunk. good initiation from pt    Transfers Overall transfer level: Needs assistance Equipment used: Ambulation equipment used Transfers: Sit to/from Stand, Bed to chair/wheelchair/BSC Sit to Stand: Mod assist, +2 physical assistance, +2 safety/equipment           General transfer comment: Mod A x 2 to stand from bedside into Stedy due to lower height and pt has difficulty initiating movement and needs verbal and tactile cues to stand initially. good initiation but required +2 to lift bottom fully upright d/t pt scooting forward. Use of Stedy for transfer to chair. Pt was able to stand 30 seconds.  Then sat on STedy and rested.  SEcond attempt from Stedy min assist of 2 to stand and stood 2 more times - 1 min and last attempt was 1 min 20 seconds.  Maxisky lift pad in Insurance risk surveyor via Lift Equipment: Stedy   Balance Overall balance assessment: Needs assistance Sitting-balance support: No upper extremity supported, Feet unsupported Sitting balance-Leahy Scale: Fair Sitting balance - Comments: min assist with progression to CGA EOB   Standing balance support: Bilateral upper extremity supported, Reliant on assistive device for balance, During functional activity Standing balance-Leahy Scale: Poor Standing balance comment: relies on Stedy for support and +2 mod assist                           ADL either performed or assessed with clinical judgement   ADL Overall ADL's : Needs assistance/impaired     Grooming:  Wash/dry hands;Wash/dry face;Brushing hair;Applying deodorant;Oral care;Set up;Sitting Grooming Details (indicate cue type and reason): in recliner             Lower Body Dressing: Maximal assistance;Sitting/lateral leans Lower Body Dressing Details (indicate cue type and reason): assist to don R  sock and R shoe - pt reported balance not feeling as stable EOB when attempting this task Toilet Transfer: Moderate assistance;+2 for physical assistance;+2 for safety/equipment (stedy)   Toileting- Clothing Manipulation and Hygiene: Moderate assistance Toileting - Clothing Manipulation Details (indicate cue type and reason): in standing, Pt holding herself up in stedy            Extremity/Trunk Assessment Upper Extremity Assessment Upper Extremity Assessment: Generalized weakness   Lower Extremity Assessment Lower Extremity Assessment: Defer to PT evaluation        Vision   Vision Assessment?: No apparent visual deficits   Perception Perception Perception: Within Functional Limits   Praxis Praxis Praxis: WFL   Communication Communication Communication: No apparent difficulties   Cognition Arousal: Alert Behavior During Therapy: WFL for tasks assessed/performed Cognition: No apparent impairments                               Following commands: Impaired Following commands impaired: Follows multi-step commands inconsistently      Cueing   Cueing Techniques: Verbal cues, Gestural cues, Tactile cues  Exercises      Shoulder Instructions       General Comments BP WFL, SpO2 WFL on 1L    Pertinent Vitals/ Pain       Pain Assessment Pain Assessment: No/denies pain Breathing: normal Negative Vocalization: none Facial Expression: smiling or inexpressive Body Language: relaxed Consolability: no need to console PAINAD Score: 0 Pain Intervention(s): Monitored during session, Repositioned  Home Living                                          Prior Functioning/Environment              Frequency  Min 2X/week        Progress Toward Goals  OT Goals(current goals can now be found in the care plan section)  Progress towards OT goals: Progressing toward goals  Acute Rehab OT Goals Patient Stated Goal: very motivated to  return to  post-acute rehab OT Goal Formulation: With patient Time For Goal Achievement: 11/27/23 Potential to Achieve Goals: Good  Plan      Co-evaluation    PT/OT/SLP Co-Evaluation/Treatment: Yes Reason for Co-Treatment: For patient/therapist safety;To address functional/ADL transfers PT goals addressed during session: Mobility/safety with mobility        AM-PAC OT 6 Clicks Daily Activity     Outcome Measure   Help from another person eating meals?: None Help from another person taking care of personal grooming?: A Little Help from another person toileting, which includes using toliet, bedpan, or urinal?: A Lot Help from another person bathing (including washing, rinsing, drying)?: A Lot Help from another person to put on and taking off regular upper body clothing?: A Little Help from another person to put on and taking off regular lower body clothing?: A Lot 6 Click Score: 16    End of Session Equipment Utilized During Treatment: Gait belt;Oxygen ;Other (comment) (Stedy, 1L)  OT Visit Diagnosis: Unsteadiness on feet (R26.81);Other abnormalities of gait and mobility (R26.89);Muscle weakness (  generalized) (M62.81)   Activity Tolerance Patient tolerated treatment well   Patient Left in chair;with call bell/phone within reach;with chair alarm set;with family/visitor present   Nurse Communication Mobility status;Need for lift equipment        Time: 1003-1036 OT Time Calculation (min): 33 min  Charges: OT General Charges $OT Visit: 1 Visit OT Treatments $Self Care/Home Management : 8-22 mins  Leita DEL OTR/L Acute Rehabilitation Services Office: [phone]  Leita PARAS [facility] 11/22/2023, 12:15 PM

## 2023-11-23 DIAGNOSIS — E1169 Type 2 diabetes mellitus with other specified complication: Secondary | ICD-10-CM

## 2023-11-23 DIAGNOSIS — E785 Hyperlipidemia, unspecified: Secondary | ICD-10-CM

## 2023-11-23 DIAGNOSIS — K219 Gastro-esophageal reflux disease without esophagitis: Secondary | ICD-10-CM

## 2023-11-23 DIAGNOSIS — N1832 Chronic kidney disease, stage 3b: Secondary | ICD-10-CM

## 2023-11-23 DIAGNOSIS — E039 Hypothyroidism, unspecified: Secondary | ICD-10-CM

## 2023-11-23 DIAGNOSIS — Z89512 Acquired absence of left leg below knee: Secondary | ICD-10-CM

## 2023-11-23 DIAGNOSIS — N1831 Chronic kidney disease, stage 3a: Secondary | ICD-10-CM

## 2023-11-23 DIAGNOSIS — D509 Iron deficiency anemia, unspecified: Secondary | ICD-10-CM

## 2023-11-23 LAB — GLUCOSE, CAPILLARY
Glucose-Capillary: 146 mg/dL — ABNORMAL HIGH (ref 70–99)
Glucose-Capillary: 178 mg/dL — ABNORMAL HIGH (ref 70–99)
Glucose-Capillary: 114 mg/dL — ABNORMAL HIGH (ref 70–99)
Glucose-Capillary: 151 mg/dL — ABNORMAL HIGH (ref 70–99)

## 2023-11-23 LAB — CBC
HCT: 24.2 % — ABNORMAL LOW (ref 36.0–46.0)
Hemoglobin: 7.7 g/dL — ABNORMAL LOW (ref 12.0–15.0)
MCH: 28.8 pg (ref 26.0–34.0)
MCHC: 31.8 g/dL (ref 30.0–36.0)
MCV: 90.6 fL (ref 80.0–100.0)
Platelets: 250 K/uL (ref 150–400)
RBC: 2.67 MIL/uL — ABNORMAL LOW (ref 3.87–5.11)
RDW: 17.4 % — ABNORMAL HIGH (ref 11.5–15.5)
WBC: 10 K/uL (ref 4.0–10.5)
nRBC: 0.2 % (ref 0.0–0.2)

## 2023-11-23 LAB — COOXEMETRY PANEL
Carboxyhemoglobin: 2.4 % — ABNORMAL HIGH (ref 0.5–1.5)
Methemoglobin: <0.7 % (ref 0.0–1.5)
O2 Saturation: 73.1 %
Total hemoglobin: 8 g/dL — ABNORMAL LOW (ref 12.0–16.0)

## 2023-11-23 LAB — RENAL FUNCTION PANEL
Albumin: 2.6 g/dL — ABNORMAL LOW (ref 3.5–5.0)
Anion gap: 11 (ref 5–15)
BUN: 71 mg/dL — ABNORMAL HIGH (ref 6–20)
CO2: 25 mmol/L (ref 22–32)
Calcium: 8.4 mg/dL — ABNORMAL LOW (ref 8.9–10.3)
Chloride: 97 mmol/L — ABNORMAL LOW (ref 98–111)
Creatinine, Ser: 2.88 mg/dL — ABNORMAL HIGH (ref 0.44–1.00)
GFR, Estimated: 19 mL/min — ABNORMAL LOW (ref >60)
Glucose, Bld: 120 mg/dL — ABNORMAL HIGH (ref 70–99)
Phosphorus: 4.7 mg/dL — ABNORMAL HIGH (ref 2.5–4.6)
Potassium: 3.1 mmol/L — ABNORMAL LOW (ref 3.5–5.1)
Sodium: 133 mmol/L — ABNORMAL LOW (ref 135–145)

## 2023-11-23 LAB — MAGNESIUM: Magnesium: 2.1 mg/dL (ref 1.7–2.4)

## 2023-11-23 MED ORDER — POTASSIUM CHLORIDE CRYS ER 20 MEQ PO TBCR
20.0000 meq | EXTENDED_RELEASE_TABLET | Freq: Two times a day (BID) | ORAL | Status: AC
  Administered 2023-11-23 – 2023-11-24 (×3): 20 meq via ORAL
  Filled 2023-11-23 (×3): qty 1

## 2023-11-23 MED ORDER — FUROSEMIDE 10 MG/ML IJ SOLN
80.0000 mg | Freq: Three times a day (TID) | INTRAMUSCULAR | Status: AC
  Administered 2023-11-23 – 2023-11-24 (×4): 80 mg via INTRAVENOUS
  Filled 2023-11-23 (×4): qty 8

## 2023-11-23 NOTE — Progress Notes (Signed)
 Patient ID: Kayla Schmitt, female   DOB: 06/25/1965, 58 y.o.   MRN: [id number] [facility] Progress Note   Assessment/ Plan:   1. Acute kidney Injury on chronic kidney disease stage III (baseline creatinine appears 1.3-1.6): Hemodynamically mediated in the setting of acute exacerbation of congestive heart failure, transiently on CRRT that was discontinued on 7/22.  Weaned off of pressors/inotropes overnight.  She continues to have decent urine output augmented by diuretics and this morning with possible slight improvement of kidney function.  Will continue to monitor for renal recovery, she does not have indications for renal replacement therapy at this time.  Will replace potassium (hypokalemia from diuretic losses). 2.  Acute exacerbation of congestive heart failure/cardiogenic shock: History of congestive heart failure with reduced ejection fraction and decompensation noted during this hospitalization.  Refractory to diuretics in the setting of inotropic/pressor support and started on CRRT transiently for extracorporeal volume unloading.  Continues to remain stable on diuretics. 3.  Hyponatremia: Hypervolemic hyponatremia and anticipate will improve with continued ultrafiltration.  Monitor with diuresis. 4.  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  Continue antibiotics per primary service. 5.  Anemia: Secondary to critical illness and prior history of GI bleed.  PRBC transfusion done yesterday with stable H/H overnight.  Subjective:   Without acute events overnight after transferred to stepdown unit from cardiac ICU.   Objective:   BP (!) 99/51 (BP Location: Left Arm)   Pulse 83   Temp 97.8 F (36.6 C) (Oral)   Resp 13   Ht 5' 4 (1.626 m)   Wt 89 kg   SpO2 96%   BMI 33.68 kg/m   Intake/Output Summary (Last 24 hours) at 11/23/2023 0741 Last data filed at 11/23/2023 0434 Gross per 24 hour   Intake 280.37 ml  Output 1790 ml  Net -1509.63 ml   Weight change: -1 kg  Physical Exam: Gen: Appears comfortable resting in bed, mother asleep at bedside. CVS: Pulse regular rhythm, normal rate S1 and S2 normal Resp: No audible rales/rhonchi at this time, poor inspiratory effort Abd: Soft, obese, nontender, bowel sounds normal Ext: Status post left BKA and right forefoot amputation.  Trace edema over dependent areas..  Imaging: No results found.   Labs: BMET Recent Labs  Lab 11/17/23 1600 11/18/23 0536 11/18/23 1457 11/19/23 0304 11/19/23 1310 11/20/23 0520 11/20/23 1655 11/21/23 0535 11/22/23 0512 11/23/23 0510  NA 133* 134* 135 131* 130* 131* 131* 129* 130* 133*  K 4.6 4.0 4.2 4.1 3.7 4.0 4.0 3.8 3.5 3.1*  CL 98 100 101 97* 97* 97* 97* 95* 95* 97*  CO2 21* 24 25 22  20* 23 21* 20* 22 25  GLUCOSE 214* 134* 164* 140* 167* 126* 106* 128* 121* 120*  BUN 29* 22* 18 14 14  28* 37* 42* 61* 71*  CREATININE 1.77* 1.59* 1.42* 1.35* 1.55* 2.23* 2.69* 3.01* 2.98* 2.88*  CALCIUM  8.4* 8.1* 8.2* 8.0* 7.9* 8.2* 8.3* 8.2* 8.2* 8.4*  PHOS 2.7 2.6 2.7 2.3*  --   --   --  4.8* 4.8* 4.7*   CBC Recent Labs  Lab 11/19/23 0304 11/20/23 0520 11/20/23 1655 11/21/23 0535 11/22/23 0512 11/23/23 0510  WBC 17.5* 13.1* 12.5* 11.8* 9.7 10.0  NEUTROABS 12.4* 8.5*  --  7.1 5.7  --   HGB 7.4* 6.9* 8.3* 8.4* 8.1* 7.7*  HCT 23.3* 22.8* 26.1* 26.4* 25.2* 24.2*  MCV 90.7 91.9 89.1 89.5 90.0 90.6  PLT 486*  357 321 301 236 250    Medications:     aspirin   81 mg Oral Daily   atorvastatin   40 mg Oral Daily   bethanechol   10 mg Oral TID   Chlorhexidine  Gluconate Cloth  6 each Topical Daily   docusate sodium   100 mg Oral BID   feeding supplement  237 mL Oral Q1400   furosemide   80 mg Intravenous TID   gabapentin   300 mg Oral QHS   heparin  injection (subcutaneous)  5,000 Units Subcutaneous Q8H   insulin  aspart  0-15 Units Subcutaneous TID WC   insulin  glargine-yfgn  6 Units Subcutaneous  BID   levothyroxine   150 mcg Oral Q0600   multivitamin  1 tablet Oral QHS   nutrition supplement (JUVEN)  1 packet Oral BID BM   oxidized cellulose  1 each Topical Once   pantoprazole   40 mg Oral Daily   potassium chloride   20 mEq Oral BID   sodium chloride  flush  10-40 mL Intracatheter Q12H    Gordy Blanch, MD 11/23/2023, 7:41 AM '

## 2023-11-23 NOTE — Plan of Care (Signed)
   Problem: Clinical Measurements: Goal: Will remain free from infection Outcome: Progressing   Problem: Clinical Measurements: Goal: Diagnostic test results will improve Outcome: Progressing   Problem: Clinical Measurements: Goal: Respiratory complications will improve Outcome: Progressing

## 2023-11-23 NOTE — Assessment & Plan Note (Signed)
 Continue levothyroxine 

## 2023-11-23 NOTE — Assessment & Plan Note (Addendum)
 Right foot skin ulceration.   Recent MRSA bacteremia, completed daptomycin  and 7 days of linezolid  with Zosyn , on 07/24   Peripheral vascular disease.  08/02/2022 she underwent left SFA and above-knee popliteal percutaneous thrombectomy with JETI and additional angioplasty and stenting into the above-knee popliteal artery for an occluded distal SFA stent with tissue loss. She then on 08/06/2022 underwent laser arthrectomy of the right SFA above-knee popliteal with DCB for in-stent stenosis

## 2023-11-23 NOTE — Assessment & Plan Note (Signed)
 Holding antihypertensive medications due to risk of hypotension.

## 2023-11-23 NOTE — Assessment & Plan Note (Addendum)
 07/18 echocardiogram with reduced LV systolic function to 25%, mild to moderate MR, mild AS.  07/23 limited echocardiogram with reduced LV systolic function with EF 25 to 30%, global hypokinesis, mild to moderate MR, moderate to severe aortic stenosis,   Patient required vasopressors and inotropic support with norepinephrine  and dobutamine , off 07/24.  Cardiogenic shock has resolved.   08/01 cardiac catheterization  RA 8  RV 58/15  PA 60/25 mean 40  PCWP mean 29 with prominent v waves to 43  Cardiac output 5.28 and index 2,77 Fick)  PVR 2,1 WU   Patient was placed on midodrine  for blood pressure support and loop diuretic for volume control.     Systolic blood pressure is 100 mmHg range.   Holding on afterload reduction due to risk of hypotension.   Acute hypoxemia and hypercapnic respiratory failure due to acute cardiogenic pulmonary edema and aspiration pneumonia.  Liberated from invasive mechanical ventilation on 07/20.  Completed 7 days of antibiotic therapy. 02 saturation 96% on  2L min per [place], continue supplemental 02 at home, since room air 02 was 87%   Required second intubation post urologic procedure.  Liberated from mechanical ventilation 08/04  02 saturation 95% on 1 L/min per [place]   Patient is not a TAVR or SAVR candidate.

## 2023-11-23 NOTE — Assessment & Plan Note (Addendum)
 No chest pain, no acute coronary syndrome.  Continue aspirin  and clopidogrel .  Statin

## 2023-11-23 NOTE — Hospital Course (Addendum)
 Mrs. Seaborn was admitted to the hospital with the working diagnosis of heart failure exacerbation complicated with renal failure.   58 yo female with the past medical history of aortic stenosis, coronary artery disease, mitral regurgitation, heart failure, peripheral vascular disease and hypertension who presented with dyspnea.  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 She was discharged to CIR. On 07/15 she was found volume overloaded, worsening peripheral edema, positive dyspnea and 02 saturation in the 80's. Her blood pressure was 117/72, HR 123, RR 25 and 02 saturation 94% on supplemental 02 per [place].  Lungs with bilateral rales, increased work of breathing, heart with S1 and S2 present and regular, positive systolic murmur at the base, abdomen with no distention and positive right lower extremity edema.    Na 135, K 4.4 Cl 100 bicarbonate 23 glucose 181, BUN 24 cr 1.28  High sensitive troponin 489, 554, 671  Wbc 11.4 hgb 9,1 plt 496   EKG 105 bpm, normal axis, normal intervals qtc 452, sinus rhythm with no significant ST segment or T wave changes.   Chest radiograph with cardiomegaly with diffuse bilateral interstitial infiltrates.   7/16 admission 7/17 ICU, intubated 7/18 started spiking fever with Tmax 101.5, tachycardic.  X-ray chest still showing bilateral infiltrate but FiO2 is coming down.  Lasix  infusion increased to 30 mg/h 7/19 started on CRRT, tolerating spontaneous breathing trial 7/20 extubated 7/22 CRRT holiday, Levophed /dobutamine  7/23 lasix  challenge, transfused 1u PRBC, remains low dose NE, DBA 2.5, CVP 7 7/24 laisx, off DBA  07/26 transferred to [facility].  07/27 renal function improving.  07/28 improved volume status, noted high BUN.  07/29 pending improvement in BUN  07/30 BUN has stabilized.  7/31: PCCM was called for eval. PCXR: worse pulm edema and bilateral pleural effusions. Negative 1.6 L  fluid balance in the last 3 days Placed on non invasive mechanical ventilation, Bipap.  8/2 started with hematuria, Foley was leaking, urology unable to irrigate the bladder, went to the OR, underwent cystoscopy which showed large clot in urinary bladder no lesion no bladder perf. Clot was evacuated, placed on CBI. Etiology of clot not clear. Urology hypothesizing UTI, placed on IV antibiotic therapy.  Remained intubated post urologic procedure.  8/3 remained on vent. CBI continued  8/4 passed SBT extubated to BIPAP. CVP ~10. Clear liq diet started. Coughing intermittently SLP eval requested  08/06 transferred to [facility] 08/07 foley was removed, patient with significant discomfort from catheter, no hematuria. Bladder scans with no urinary retention.  08/08 cental line removed. Patient will be discharge home and follow up as outpatient. Home health services.

## 2023-11-23 NOTE — Progress Notes (Addendum)
 Progress Note   Patient: Kayla Schmitt FMW:[id number] DOB: [birth year] DOA: 11/12/2023     11 DOS: the patient was seen and examined on 11/23/2023   Brief hospital course: Kayla Schmitt was admitted to the hospital with the working diagnosis of heart failure exacerbation.   58 yo female with the past medical history of aortic stenosis, coronary artery disease, mitral regurgitation, heart failure, peripheral vascular disease and hypertension.  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 She was discharged to CIR. On 07/15 she was found volume overloaded.   7/16 admission 7/17 ICU, intubated 7/18 started spiking fever with Tmax 101.5, tachycardic.  X-ray chest still showing bilateral infiltrate but FiO2 is coming down.  Lasix  infusion increased to 30 mg/h 7/19 started on CRRT, tolerating spontaneous breathing trial 7/20 extubated 7/22 CRRT holiday, Levophed /dobutamine  7/23 lasix  challenge, transfused 1u PRBC, remains low dose NE, DBA 2.5, CVP 7 7/24 laisx, off DBA  07/26 transferred to [facility].   Assessment and Plan: * Acute on chronic systolic CHF (congestive heart failure) (HCC) 07/18 echocardiogram with reduced LV systolic function to 25%, mild to moderate MR, mild AS.  07/23 limited echocardiogram with reduced LV systolic function with EF 25 to 30%, global hypokinesis, mild to moderate MR, moderate to severe aortic stenosis,   Cardiogenic shock, recovering.   07/17 unable to do cardiac catheterization due to pulmonary edema.   Patient required vasopressors and inotropic support with norepinephrine  and dobutamine , off 07/24.   Today urine output is 1,790 ml Systolic blood pressure is 95 mmHg range.  Sv02 73.1   Currently on furosemide  80 mg IV tid.  Holding on afterload reduction due to risk of hypotension.   Acute hypoxemia and hypercapnic respiratory failure due to acute cardiogenic pulmonary edema  and aspiration pneumonia.  Liberated from invasive mechanical ventilation on 07/20.  Completed 7 days of antibiotic therapy.   CAD (coronary artery disease) No chest pain, no acute coronary syndrome.   Hypertension Holding antihypertensive medications due to risk of hypotension.   Chronic kidney disease, stage 3a (HCC) AKI, hyponatremia, hypokalemia, urinary retention.   Patient required renal replacement therapy 07/22 discontinued CRRT.  07/24 urinary retention (650 ml) placed foley.  07/26 Foley removed last night, check bladder scan.   Today serum cr is 2,8 with K at 3,1 and bicarbonate at 25  Na 133. BUN 71. P 4.7   Plan to continue diuresis with IV furosemide .   Type 2 diabetes mellitus with hyperlipidemia (HCC) Continue glucose cover and monitoring with insulin  sliding scale.  Basal insulin   Fasting glucose today 120 mg /dl   Continue statin therapy   S/P BKA (below knee amputation) unilateral, left (HCC) Right foot skin ulceration.   Recent MRSA bacteremia, completed daptomycin  and 7 days of linezolid  with Zosyn , 07/24   Hypothyroidism Continue levothyroxine    GERD (gastroesophageal reflux disease) Continue with ppi  Iron  deficiency anemia 07/23 transfused one unit PRBC.         Subjective: Patient is feeling better, dyspnea and edema continue to improve. Foley was removed and she has noticed decreased urine output.   Physical Exam: Vitals:   11/23/23 0300 11/23/23 0434 11/23/23 0500 11/23/23 0731  BP: 99/60 (!) 93/54  (!) 99/51  Pulse: 79 79 83   Resp: 15 14 13    Temp: 97.8 F (36.6 C) 97.7 F (36.5 C)  97.8 F (36.6 C)  TempSrc: Oral Oral  Oral  SpO2: 97% 97% 96%  Weight:   89 kg   Height:       Neurology awake and alert ENT with mild pallor Cardiovascular with S1 and S2 present and regular, positive systolic murmur at the apex and at the base. No gallops or rubs Respiratory with mild rales at bases with no wheezing or rhonchi.  Abdomen  with no distention Left BKA, right with only trace edema, non pitting  Data Reviewed:    Family Communication: I spoke with patient's mother at the bedside, we talked in detail about patient's condition, plan of care and prognosis and all questions were addressed.   Disposition: Status is: Inpatient Remains inpatient appropriate because: recovering renal and heart failure   Planned Discharge Destination: Home    Author: Elidia Toribio [place], MD 11/23/2023 8:27 AM  For on call review [email or web address].

## 2023-11-23 NOTE — Assessment & Plan Note (Addendum)
 AKI, hyponatremia, hypokalemia, hypomagnesemia urinary retention.   Patient required renal replacement therapy 07/22 discontinued CRRT.  07/24 urinary retention (650 ml) placed foley.  07/26 Foley removed last night, check bladder scan.  07/27 tolerating well off foley.   Patient developed hematuria and required replacement of foley catheter.  08/02 cystoscopy, evacuation of clot from bladder and fulguration of bleeding.  Continuous bladder irrigation  Urinary tract infection required IV ceftriaxone  (not related to urinary cathter/ not present on admission)   Today renal function with serum cr at 1,50 with K at 3,9 and serum bicarbonate at 28  Na 136

## 2023-11-23 NOTE — Assessment & Plan Note (Addendum)
 07/23 transfused one unit PRBC.  08/02 transfused one unit PRBC Follow up hgb is 8.3  B 12 658 Follow up cell count as outpatient.

## 2023-11-23 NOTE — Plan of Care (Signed)
 ?  Problem: Clinical Measurements: ?Goal: Ability to maintain clinical measurements within normal limits will improve ?Outcome: Progressing ?Goal: Will remain free from infection ?Outcome: Progressing ?Goal: Diagnostic test results will improve ?Outcome: Progressing ?  ?

## 2023-11-23 NOTE — Assessment & Plan Note (Signed)
Continue with ppi

## 2023-11-23 NOTE — Assessment & Plan Note (Addendum)
 Hyperglycemia  Patient was placed on insulin  sliding scale for glucose cover and monitoring.   Basal insulin   Fasting glucose today 233 mg /dl   Continue statin therapy.

## 2023-11-24 LAB — RENAL FUNCTION PANEL
Albumin: 2.6 g/dL — ABNORMAL LOW (ref 3.5–5.0)
Anion gap: 14 (ref 5–15)
BUN: 79 mg/dL — ABNORMAL HIGH (ref 6–20)
CO2: 22 mmol/L (ref 22–32)
Calcium: 8.8 mg/dL — ABNORMAL LOW (ref 8.9–10.3)
Chloride: 96 mmol/L — ABNORMAL LOW (ref 98–111)
Creatinine, Ser: 2.75 mg/dL — ABNORMAL HIGH (ref 0.44–1.00)
GFR, Estimated: 20 mL/min — ABNORMAL LOW (ref >60)
Glucose, Bld: 201 mg/dL — ABNORMAL HIGH (ref 70–99)
Phosphorus: 4.2 mg/dL (ref 2.5–4.6)
Potassium: 3.8 mmol/L (ref 3.5–5.1)
Sodium: 132 mmol/L — ABNORMAL LOW (ref 135–145)

## 2023-11-24 LAB — GLUCOSE, CAPILLARY
Glucose-Capillary: 172 mg/dL — ABNORMAL HIGH (ref 70–99)
Glucose-Capillary: 159 mg/dL — ABNORMAL HIGH (ref 70–99)
Glucose-Capillary: 157 mg/dL — ABNORMAL HIGH (ref 70–99)
Glucose-Capillary: 276 mg/dL — ABNORMAL HIGH (ref 70–99)

## 2023-11-24 LAB — MAGNESIUM: Magnesium: 1.9 mg/dL (ref 1.7–2.4)

## 2023-11-24 MED ORDER — ORAL CARE MOUTH RINSE
15.0000 mL | OROMUCOSAL | Status: DC | PRN
Start: 2023-11-24 — End: 2023-12-06

## 2023-11-24 MED ORDER — TORSEMIDE 20 MG PO TABS
80.0000 mg | ORAL_TABLET | Freq: Two times a day (BID) | ORAL | Status: DC
  Administered 2023-11-24 – 2023-11-25 (×2): 80 mg via ORAL
  Filled 2023-11-24 (×2): qty 4

## 2023-11-24 MED ORDER — POTASSIUM CHLORIDE CRYS ER 20 MEQ PO TBCR
20.0000 meq | EXTENDED_RELEASE_TABLET | Freq: Once | ORAL | Status: AC
  Administered 2023-11-24: 20 meq via ORAL
  Filled 2023-11-24: qty 1

## 2023-11-24 NOTE — Progress Notes (Signed)
 Progress Note   Patient: Kayla Schmitt FMW:[id number] DOB: 05-20-1965 DOA: 11/12/2023     12 DOS: the patient was seen and examined on 11/24/2023   Brief hospital course: Mrs. Sax was admitted to the hospital with the working diagnosis of heart failure exacerbation.   58 yo female with the past medical history of aortic stenosis, coronary artery disease, mitral regurgitation, heart failure, peripheral vascular disease and hypertension.  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 She was discharged to CIR. On 07/15 she was found volume overloaded.   7/16 admission 7/17 ICU, intubated 7/18 started spiking fever with Tmax 101.5, tachycardic.  X-ray chest still showing bilateral infiltrate but FiO2 is coming down.  Lasix  infusion increased to 30 mg/h 7/19 started on CRRT, tolerating spontaneous breathing trial 7/20 extubated 7/22 CRRT holiday, Levophed /dobutamine  7/23 lasix  challenge, transfused 1u PRBC, remains low dose NE, DBA 2.5, CVP 7 7/24 laisx, off DBA  07/26 transferred to [facility].  07/27 renal function improving.   Assessment and Plan: * Acute on chronic systolic CHF (congestive heart failure) (HCC) 07/18 echocardiogram with reduced LV systolic function to 25%, mild to moderate MR, mild AS.  07/23 limited echocardiogram with reduced LV systolic function with EF 25 to 30%, global hypokinesis, mild to moderate MR, moderate to severe aortic stenosis,   Cardiogenic shock, recovering.   07/17 unable to do cardiac catheterization due to pulmonary edema.   Patient required vasopressors and inotropic support with norepinephrine  and dobutamine , off 07/24.   Today urine output is 1,550 ml Systolic blood pressure is 95 mmHg range.   Transitioned to po torsemide  80 mg bid.  Holding on afterload reduction due to risk of hypotension.   Acute hypoxemia and hypercapnic respiratory failure due to acute  cardiogenic pulmonary edema and aspiration pneumonia.  Liberated from invasive mechanical ventilation on 07/20.  Completed 7 days of antibiotic therapy. 02 saturation 97% on 2 L.min per [place]  CAD (coronary artery disease) No chest pain, no acute coronary syndrome.   Hypertension Holding antihypertensive medications due to risk of hypotension.   Chronic kidney disease, stage 3a (HCC) AKI, hyponatremia, hypokalemia, urinary retention.   Patient required renal replacement therapy 07/22 discontinued CRRT.  07/24 urinary retention (650 ml) placed foley.  07/26 Foley removed last night, check bladder scan.  07/27 tolerating well off foley.   Renal function with serum cr at 2,75 with K at 3,8 and serum bicarbonate at 22  Na 132 Mg. 1.9   Transitioned to po loop diuretic Kcl for hypokalemia 20 meq x1  Follow up renal function and electrolytes in am.   Type 2 diabetes mellitus with hyperlipidemia (HCC) Continue glucose cover and monitoring with insulin  sliding scale.  Basal insulin   Fasting glucose today 201 mg /dl (hyperglycemia)   Continue statin therapy.  S/P BKA (below knee amputation) unilateral, left (HCC) Right foot skin ulceration.   Recent MRSA bacteremia, completed daptomycin  and 7 days of linezolid  with Zosyn , on 07/24   Hypothyroidism Continue levothyroxine    GERD (gastroesophageal reflux disease) Continue with ppi  Iron  deficiency anemia 07/23 transfused one unit PRBC.      Subjective: Patient is feeling better, no chest pain and no dyspnea, she has been voiding urine with no signs of urinary retention   Physical Exam: Vitals:   11/23/23 2238 11/24/23 0345 11/24/23 0810 11/24/23 1125  BP: (!) 108/57 102/64 (!) 93/59 (!) 95/59  Pulse:      Resp:  18 20  Temp: 98.7 F (37.1 C) 97.7 F (36.5 C) 98.6 F (37 C) 98.1 F (36.7 C)  TempSrc: Oral Oral Oral Oral  SpO2:   93% 97%  Weight:  85.4 kg    Height:       Neurology awake and alert ENT with mild  pallor Cardiovascular with S1 and S2 present and regular with no gallops, or rubs, positive systolic murmur at the base and at the apex.  Respiratory with mild rales at the left base with no wheezing or rhonchi  Abdomen with no distention, non tender Left BKA and right foot transmetatarsal amputation  Trace non pitting peripheral edema  Data Reviewed:    Family Communication: I spoke with patient's mother at the bedside, we talked in detail about patient's condition, plan of care and prognosis and all questions were addressed.   Disposition: Status is: Inpatient Remains inpatient appropriate because: recovering renal failure   Planned Discharge Destination: home     Author: Elidia Toribio Furnace, MD 11/24/2023 1:11 PM  For on call review [email or web address].

## 2023-11-24 NOTE — Progress Notes (Signed)
 Patient ID: Kayla Schmitt, female   DOB: [birth year], 58 y.o.   MRN: [id number] [facility] Progress Note   Assessment/ Plan:   1. Acute kidney Injury on chronic kidney disease stage III (baseline creatinine appears 1.3-1.6): Hemodynamically mediated in the setting of acute exacerbation of congestive heart failure, transiently on CRRT that was discontinued on 7/22.  She is continue maintain decent urine output and gradually downtrending creatinine with ongoing diuretic support.  She got IV furosemide  80 mg this morning and I will start her on torsemide  80 mg twice daily this evening.  Hemodialysis catheter can be removed in the next 24 to 48 hours if she continues to have evidence of renal recovery. 2.  Acute exacerbation of congestive heart failure/cardiogenic shock: History of congestive heart failure with reduced ejection fraction and decompensation noted during this hospitalization.  Refractory to diuretics in the setting of inotropic/pressor support and started on CRRT transiently for extracorporeal volume unloading.  Fair urine output augmented by ongoing diuresis.  No indication for dialysis. 3.  Hyponatremia: Hypervolemic hyponatremia in the setting of acute kidney injury/CHF exacerbation, monitor with diuresis and renal recovery. 4.  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  Continue antibiotics per primary service. 5.  Anemia: Secondary to critical illness and prior history of GI bleed.  PRBC transfusion done yesterday with stable H/H overnight.  Subjective:   Denies any acute complaints at this time and encouraged to hear about her improving kidney function.   Objective:   BP (!) 93/59 (BP Location: Left Arm)   Pulse 87   Temp 98.6 F (37 C) (Oral)   Resp 18   Ht 5' 4 (1.626 m)   Wt 85.4 kg   SpO2 93%   BMI 32.32 kg/m   Intake/Output Summary (Last 24 hours) at 11/24/2023 1017 Last  data filed at 11/24/2023 1012 Gross per 24 hour  Intake 850 ml  Output 1550 ml  Net -700 ml   Weight change: -3.6 kg  Physical Exam: Gen: Comfortably resting in bed, watching television. CVS: Pulse regular rhythm, normal rate S1 and S2 normal Resp: No audible rales/rhonchi at this time, poor inspiratory effort Abd: Soft, obese, nontender, bowel sounds normal Ext: Status post left BKA and right forefoot amputation.  Trace edema over dependent areas..  Imaging: No results found.   Labs: BMET Recent Labs  Lab 11/18/23 0536 11/18/23 1457 11/19/23 0304 11/19/23 1310 11/20/23 0520 11/20/23 1655 11/21/23 0535 11/22/23 0512 11/23/23 0510 11/24/23 0841  NA 134* 135 131* 130* 131* 131* 129* 130* 133* 132*  K 4.0 4.2 4.1 3.7 4.0 4.0 3.8 3.5 3.1* 3.8  CL 100 101 97* 97* 97* 97* 95* 95* 97* 96*  CO2 24 25 22  20* 23 21* 20* 22 25 22   GLUCOSE 134* 164* 140* 167* 126* 106* 128* 121* 120* 201*  BUN 22* 18 14 14  28* 37* 42* 61* 71* 79*  CREATININE 1.59* 1.42* 1.35* 1.55* 2.23* 2.69* 3.01* 2.98* 2.88* 2.75*  CALCIUM  8.1* 8.2* 8.0* 7.9* 8.2* 8.3* 8.2* 8.2* 8.4* 8.8*  PHOS 2.6 2.7 2.3*  --   --   --  4.8* 4.8* 4.7* 4.2   CBC Recent Labs  Lab 11/19/23 0304 11/20/23 0520 11/20/23 1655 11/21/23 0535 11/22/23 0512 11/23/23 0510  WBC 17.5* 13.1* 12.5* 11.8* 9.7 10.0  NEUTROABS 12.4* 8.5*  --  7.1 5.7  --   HGB 7.4* 6.9* 8.3* 8.4* 8.1* 7.7*  HCT 23.3* 22.8*  26.1* 26.4* 25.2* 24.2*  MCV 90.7 91.9 89.1 89.5 90.0 90.6  PLT 486* 357 321 301 236 250    Medications:     aspirin   81 mg Oral Daily   atorvastatin   40 mg Oral Daily   bethanechol   10 mg Oral TID   Chlorhexidine  Gluconate Cloth  6 each Topical Daily   docusate sodium   100 mg Oral BID   feeding supplement  237 mL Oral Q1400   gabapentin   300 mg Oral QHS   heparin  injection (subcutaneous)  5,000 Units Subcutaneous Q8H   insulin  aspart  0-15 Units Subcutaneous TID WC   insulin  glargine-yfgn  6 Units Subcutaneous BID    levothyroxine   150 mcg Oral Q0600   multivitamin  1 tablet Oral QHS   nutrition supplement (JUVEN)  1 packet Oral BID BM   oxidized cellulose  1 each Topical Once   pantoprazole   40 mg Oral Daily   sodium chloride  flush  10-40 mL Intracatheter Q12H    Gordy Blanch, MD 11/24/2023, 10:17 AM '

## 2023-11-25 ENCOUNTER — Telehealth: Payer: Self-pay | Admitting: Orthopedic Surgery

## 2023-11-25 LAB — RENAL FUNCTION PANEL
Albumin: 2.6 g/dL — ABNORMAL LOW (ref 3.5–5.0)
Anion gap: 12 (ref 5–15)
BUN: 91 mg/dL — ABNORMAL HIGH (ref 6–20)
CO2: 25 mmol/L (ref 22–32)
Calcium: 8.7 mg/dL — ABNORMAL LOW (ref 8.9–10.3)
Chloride: 97 mmol/L — ABNORMAL LOW (ref 98–111)
Creatinine, Ser: 2.31 mg/dL — ABNORMAL HIGH (ref 0.44–1.00)
GFR, Estimated: 24 mL/min — ABNORMAL LOW (ref >60)
Glucose, Bld: 154 mg/dL — ABNORMAL HIGH (ref 70–99)
Phosphorus: 3.4 mg/dL (ref 2.5–4.6)
Potassium: 4 mmol/L (ref 3.5–5.1)
Sodium: 134 mmol/L — ABNORMAL LOW (ref 135–145)

## 2023-11-25 LAB — MAGNESIUM: Magnesium: 1.6 mg/dL — ABNORMAL LOW (ref 1.7–2.4)

## 2023-11-25 LAB — GLUCOSE, CAPILLARY
Glucose-Capillary: 142 mg/dL — ABNORMAL HIGH (ref 70–99)
Glucose-Capillary: 210 mg/dL — ABNORMAL HIGH (ref 70–99)
Glucose-Capillary: 196 mg/dL — ABNORMAL HIGH (ref 70–99)
Glucose-Capillary: 163 mg/dL — ABNORMAL HIGH (ref 70–99)

## 2023-11-25 LAB — CBC
HCT: 25.6 % — ABNORMAL LOW (ref 36.0–46.0)
Hemoglobin: 8.1 g/dL — ABNORMAL LOW (ref 12.0–15.0)
MCH: 28.7 pg (ref 26.0–34.0)
MCHC: 31.6 g/dL (ref 30.0–36.0)
MCV: 90.8 fL (ref 80.0–100.0)
Platelets: 222 K/uL (ref 150–400)
RBC: 2.82 MIL/uL — ABNORMAL LOW (ref 3.87–5.11)
RDW: 17.8 % — ABNORMAL HIGH (ref 11.5–15.5)
WBC: 9.6 K/uL (ref 4.0–10.5)
nRBC: 0 % (ref 0.0–0.2)

## 2023-11-25 MED ORDER — MAGNESIUM SULFATE 4 GM/100ML IV SOLN
4.0000 g | Freq: Once | INTRAVENOUS | Status: AC
  Administered 2023-11-25: 4 g via INTRAVENOUS
  Filled 2023-11-25: qty 100

## 2023-11-25 MED ORDER — HEPARIN SODIUM (PORCINE) 5000 UNIT/ML IJ SOLN
5000.0000 [IU] | Freq: Two times a day (BID) | INTRAMUSCULAR | Status: DC
  Administered 2023-11-25 – 2023-11-28 (×6): 5000 [IU] via SUBCUTANEOUS
  Filled 2023-11-25 (×6): qty 1

## 2023-11-25 NOTE — Progress Notes (Signed)
 Patient ID: Kayla Schmitt, female   DOB: 1965/07/28, 58 y.o.   MRN: [id number] Patient is seen in follow-up she is status post a left below-knee amputation.  Examination the incision is well-approximated and healed well there is no drainage no cellulitis.  I will place orders to remove the sutures and staples.  Patient will continue the stump shrinker and I will follow-up in the office after discharge.

## 2023-11-25 NOTE — Progress Notes (Signed)
 Patient ID: Kayla Schmitt, female   DOB: [birth year], 58 y.o.   MRN: [id number]     Advanced Heart Failure Rounding Note  Cardiologist: Vishnu SHAUNNA Maywood, MD  AHF Consulting MD: Dr. Cherrie Chief Complaint: Acute on chronic HFpEF / RV Failure Patient Profile   58 y.o. female with history of PAD with prior SFA stents and bilateral TMA, severe 3v CAD, MR/AS, DM II, anemia, HTN, HLD, hypothyroidism.  Admitted in 6/25 with MRSA bactermia and necrotizing fasciitis s/p left BKA. Transferred back to [facility] from rehab d/t acute on chronic CHF with concern for low-output and acute respiratory failure.  Subjective:    7/18: aborted cath for acute respiratory distress; intubated; DBA started 7/19: started on CRRT 7/20: extubated 7/21: hgb 6.5 s/p 1u RBCs 7/23: CRRT stopped  1150 cc UOP yesterday with no diuretic.  Creatinine 2.76 => 2.31, BUN 79 => 91.   No dyspnea currently, no chest pain. SBP 90s.   Objective:    Weight Range: 85.1 kg Body mass index is 32.2 kg/m.   Vital Signs:   Temp:  [98 F (36.7 C)-98.9 F (37.2 C)] 98.6 F (37 C) (07/28 0747) Pulse Rate:  [86-100] 96 (07/28 0747) Resp:  [18-21] 21 (07/28 0747) BP: (86-102)/(48-66) 86/48 (07/28 0747) SpO2:  [93 %-99 %] 99 % (07/28 0747) Weight:  [85.1 kg] 85.1 kg (07/28 0449) Last BM Date : 11/22/23  Weight change: Filed Weights   11/23/23 0500 11/24/23 0345 11/25/23 0449  Weight: 89 kg 85.4 kg 85.1 kg   Intake/Output:  Intake/Output Summary (Last 24 hours) at 11/25/2023 0938 Last data filed at 11/25/2023 0459 Gross per 24 hour  Intake 10 ml  Output 1150 ml  Net -1140 ml    Physical Exam    General: NAD Neck: No JVD, no thyromegaly or thyroid nodule.  Lungs: Clear to auscultation bilaterally with normal respiratory effort. CV: Nondisplaced PMI.  Heart regular S1/S2, no S3/S4, 3/6 SEM RUSB.  1+ right ankle edema.   Abdomen: Soft, nontender, no hepatosplenomegaly, no distention.  Skin: Intact without  lesions or rashes.  Neurologic: Alert and oriented x 3.  Psych: Normal affect. Extremities: No clubbing or cyanosis. Left BKA.  HEENT: Normal.   Telemetry   SR 100s (personally reviewed)  Labs    CBC Recent Labs    11/23/23 0510 11/25/23 0209  WBC 10.0 9.6  HGB 7.7* 8.1*  HCT 24.2* 25.6*  MCV 90.6 90.8  PLT 250 222   Basic Metabolic Panel Recent Labs    92/72/74 0841 11/25/23 0209  NA 132* 134*  K 3.8 4.0  CL 96* 97*  CO2 22 25  GLUCOSE 201* 154*  BUN 79* 91*  CREATININE 2.75* 2.31*  CALCIUM  8.8* 8.7*  MG 1.9 1.6*  PHOS 4.2 3.4   Liver Function Tests Recent Labs    11/24/23 0841 11/25/23 0209  ALBUMIN  2.6* 2.6*   BNP (last 3 results) Recent Labs    11/03/23 0112 11/07/23 1457 11/13/23 0521  BNP 793.8* 336.4* 1,626.5*   Medications:    Scheduled Medications:  aspirin   81 mg Oral Daily   atorvastatin   40 mg Oral Daily   bethanechol   10 mg Oral TID   Chlorhexidine  Gluconate Cloth  6 each Topical Daily   docusate sodium   100 mg Oral BID   feeding supplement  237 mL Oral Q1400   gabapentin   300 mg Oral QHS   heparin  injection (subcutaneous)  5,000 Units Subcutaneous Q8H   insulin  aspart  0-15 Units  Subcutaneous TID WC   insulin  glargine-yfgn  6 Units Subcutaneous BID   levothyroxine   150 mcg Oral Q0600   multivitamin  1 tablet Oral QHS   nutrition supplement (JUVEN)  1 packet Oral BID BM   oxidized cellulose  1 each Topical Once   pantoprazole   40 mg Oral Daily   sodium chloride  flush  10-40 mL Intracatheter Q12H   Infusions:  magnesium  sulfate bolus IVPB     PRN Medications: acetaminophen , Gerhardt's butt cream, ipratropium-albuterol , lidocaine , mouth rinse, mouth rinse, oxyCODONE , polyethylene glycol, prochlorperazine , senna-docusate, sodium chloride  flush  Assessment/Plan  Acute on chronic HFpEF>Mixed Shock; Cardiogenic/Septic - TEE 6/25: EF 60-65%, RV okay, mild to mod MR, moderate-severe AS with mG 27 mmHg and AVA 1.07 cm2 - TTE  11/15/23 LVEF 25% - Taken for [facility] 7/17, however developed flash pulmonary edema. Unable to lie flat for procedure.  - Repeat echo with EF 25-30%, G2DD, mild to mod MR, mod-severe AS  - AKI, transiently required CVVH. Now off and nephrology has been managing diuretics.  No diuretic yesterday, UOP 1150 cc.  Volume status looks ok on exam but will set up CVP to confirm.  With rise in BUN, have not started po diuretic yet.  - Stable off DBA - GDMT limited by renal function and hypotension (SBP 90s generally).   2. Acute respiratory failure with hypoxia - CXR 7/20 with b/l diffuse airspace opacities, likely pulmonary edema, though given fevers also concern for PNA - Extubated 7/20.  - Improving. Now on [place]. Wean as able    3. CAD - NSTEMI in 4/24. Multivessel CAD. CABG had been recommended but managed medically due to other active medical issues - Further ischemic eval pending renal function => no cath with AKI.  - continue aspirin  + statin   4. Valvular heart disease - Mild to moderate MR and moderate to severe AS on echo 6/25 - Repeat echo 7/25 with extremely poor windows, based on exam expect AS moderate-severe - Echo 11/20/23 with Vmax 3.21, DVT 0.26, mod to severe AS, mG 22 - However, she is not a TAVR or SAVR candidate currently so even if she has severe AS it would not change current management   5. MRSA bacteremia - Necrotizing fasciitis - S/p recent left BKA 6/25; S/p TMA in 4/24  6. Leukocytosis - WBC14.8>15.2>17.5, peaked, now resolved - Off abx.   7. PAD - hx SFA stents - On aspirin  + statin. Had been on plavix  but stopped d/t risk of bleeding.   8. Anemia - s/p 1 u RBC 7/14 - Hx GI bleed from AVMs, ? D/t aortic valve stenosis - hgb 6.5>1u RBC>7.4>6.9>1u RBC>8.4>8.1 - fecal occult negative - will need Fe infusions in OP   9. AKI on CKD IIIa - In setting of volume overload/low-output; volume now improved. - Started on CRRT 7/19. Now off 7/22.  - Making urine,  creatinine trending down but BUN up today.  No diuretic today.  Follow CVP.    Ezra Shuck, MD  11/25/2023, 9:38 AM  Advanced Heart Failure Team Pager [phone] (M-F; 7a - 5p)  Please contact [facility] for night-coverage after hours (5p -7a ) and weekends on [email or web address]

## 2023-11-25 NOTE — Progress Notes (Signed)
 Inpatient Rehab Admissions Coordinator:    CIR following, Note medical team monitoring renal recovery. I will likely send case to insurance once therapy has worked with her today/tomorrow.   Leita Kleine, MS, CCC-SLP Rehab Admissions Coordinator  [phone] (celll) [phone] (office)

## 2023-11-25 NOTE — Plan of Care (Signed)
  Problem: Clinical Measurements: Goal: Ability to maintain clinical measurements within normal limits will improve Outcome: Progressing Goal: Will remain free from infection Outcome: Progressing Goal: Diagnostic test results will improve Outcome: Progressing Goal: Respiratory complications will improve Outcome: Progressing   Problem: Clinical Measurements: Goal: Will remain free from infection Outcome: Progressing   Problem: Clinical Measurements: Goal: Diagnostic test results will improve Outcome: Progressing   Problem: Clinical Measurements: Goal: Respiratory complications will improve Outcome: Progressing

## 2023-11-25 NOTE — Telephone Encounter (Signed)
 Patient called. Says she is still in the hospital and wants to know if Dr. Harden would come by to see her? Take stiches out? Her cb# [phone]

## 2023-11-25 NOTE — Telephone Encounter (Signed)
 Pt had a BKA and is still in the hospital. Would like for you to come and se her. Also wants to know if stitches can be removed.

## 2023-11-25 NOTE — Progress Notes (Signed)
 Physical Therapy Treatment Patient Details Name: Kayla Schmitt MRN: [id number] DOB: Jul 11, 1965 Today's Date: 11/25/2023   History of Present Illness 58 y.o female admitted 7/15 from [facility] for SOB, CHF exacerbation. 7/17 attempted RHC but couldn't lie flat with pulmonary edema and decompensation requiring intubation. 7/19 CRRT. 7/20 extubation. PMHx: admission 6/22-7/1 for LLE pain, sepsis, necrotizing fasciitis. 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 CAD, aortic stenosis, HTN, CHF, PAD, bil transmet amputation, HLD, hypothyroidsim, T2DM    PT Comments  Pt admitted with above diagnosis. Pt was able to incr standing tolerance today to [place].  Worked on posture.  Pt progressing.   Pt currently with functional limitations due to the deficits listed below (see PT Problem List). Pt will benefit from acute skilled PT to increase their independence and safety with mobility to allow discharge.       If plan is discharge home, recommend the following: A lot of help with walking and/or transfers;A lot of help with bathing/dressing/bathroom;Assistance with cooking/housework;Assist for transportation;Help with stairs or ramp for entrance   Can travel by private vehicle        Equipment Recommendations  None recommended by PT    Recommendations for Other Services OT consult;Rehab consult     Precautions / Restrictions Precautions Precautions: Fall;Other (comment) Recall of Precautions/Restrictions: Impaired Precaution/Restrictions Comments: R transmet amputation, L BKA Required Braces or Orthoses: Other Brace Other Brace: Limb guard Restrictions Weight Bearing Restrictions Per Provider Order: Yes LLE Weight Bearing Per Provider Order: Non weight bearing     Mobility  Bed Mobility Overal bed mobility: Needs Assistance Bed Mobility: Supine to Sit Rolling: Contact guard assist   Supine to sit: Contact guard     General bed mobility comments: Min A x 2 with use of bed pad to scoot EOB and light assist to lift  trunk. good initiation from pt    Transfers Overall transfer level: Needs assistance Equipment used: Ambulation equipment used Transfers: Sit to/from Stand, Bed to chair/wheelchair/BSC Sit to Stand: +2 physical assistance, +2 safety/equipment, Min assist, From elevated surface           General transfer comment: Min A of 2 to stand from bedside into [place] with pt still having difficulty initiating movement and needs verbal and tactile cues to stand initially. good initiation but required +2 to lift bottom fully upright d/t pt scooting forward. Pt needs cues to shift posture for full upright posture with good centered balance.  Use of Stedy for transfer to chair. Pt was able to stand 1 min 33 seconds.  Then sat on STedy and rested.  SEcond attempt from Stedy min assist of 2 to stand and stood 2 more times - 1 min and 33 seconds and last attempt was 2 minutes.  Pt distracted by playing pts' favorite songs while pt standing. [place] lift pad in Materials engineer: Stedy  Ambulation/Gait                   Stairs             Wheelchair Mobility     Tilt Bed    Modified Rankin (Stroke Patients Only)       Balance Overall balance assessment: Needs assistance Sitting-balance support: No upper extremity supported, Feet unsupported Sitting balance-Leahy Scale: Fair Sitting balance - Comments: min assist with progression to CGA EOB   Standing balance support: Bilateral upper extremity supported, Reliant on assistive device for balance, During functional activity Standing balance-Leahy Scale: Poor Standing balance comment: relies on  Stedy for support and +2 min assist                            Communication Communication Communication: No apparent difficulties  Cognition Arousal: Alert Behavior During Therapy: WFL for tasks assessed/performed   PT - Cognitive impairments: No apparent impairments                          Following commands: Impaired Following commands impaired: Only follows one step commands consistently, Follows multi-step commands inconsistently    Cueing Cueing Techniques: Verbal cues, Gestural cues, Tactile cues  Exercises General Exercises - Lower Extremity Long Arc Quad: AROM, Right, 5 reps, Seated    General Comments General comments (skin integrity, edema, etc.): VSS      Pertinent Vitals/Pain Pain Assessment Pain Assessment: No/denies pain Breathing: normal Negative Vocalization: none Facial Expression: smiling or inexpressive Body Language: relaxed Consolability: no need to console PAINAD Score: 0    Home Living                          Prior Function            PT Goals (current goals can now be found in the care plan section) Progress towards PT goals: Progressing toward goals    Frequency    Min 2X/week      PT Plan      Co-evaluation              AM-PAC PT 6 Clicks Mobility   Outcome Measure  Help needed turning from your back to your side while in a flat bed without using bedrails?: A Little Help needed moving from lying on your back to sitting on the side of a flat bed without using bedrails?: A Lot Help needed moving to and from a bed to a chair (including a wheelchair)?: Total Help needed standing up from a chair using your arms (e.g., wheelchair or bedside chair)?: Total Help needed to walk in hospital room?: Total Help needed climbing 3-5 steps with a railing? : Total 6 Click Score: 9    End of Session Equipment Utilized During Treatment: Gait belt;Oxygen  (2LO2) Activity Tolerance: Patient limited by fatigue Patient left: with call bell/phone within reach;in chair;with chair alarm set;with family/visitor present Nurse Communication: Mobility status;Need for lift equipment PT Visit Diagnosis: Unsteadiness on feet (R26.81);Other abnormalities of gait and mobility (R26.89);Muscle weakness (generalized)  (M62.81);Difficulty in walking, not elsewhere classified (R26.2)     Time: 9086-9064 PT Time Calculation (min) (ACUTE ONLY): 22 min  Charges:    $Therapeutic Activity: 8-22 mins PT General Charges $$ ACUTE PT VISIT: 1 Visit                     [name] M,PT Acute Rehab Services [phone]    [name] 11/25/2023, 12:58 PM

## 2023-11-25 NOTE — TOC Progression Note (Signed)
 Transition of Care [facility]) - Progression Note    Patient Details  Name: Kayla Schmitt MRN: [id number] Date of Birth: [birth year]  Transition of Care [facility]) CM/SW Contact  Lauraine FORBES Saa, LCSW Phone Number: 11/25/2023, 1:19 PM  Clinical Narrative:     1:19 PM Per chart review, [facility] admissions continues to follow patient. [facility] admissions are to submit [facility] Rehab insurance authorization today or tomorrow pending patient's participation with therapy. TOC will continue to follow and be available to assist.    Barriers to Discharge: Continued Medical Work up, Air traffic controller and Services   Discharge Planning Services: CM Consult Post Acute Care Choice: IP Rehab                                         Social Drivers of Health (SDOH) Interventions SDOH Screenings   Food Insecurity: No Food Insecurity (11/14/2023)  Housing: Low Risk  (11/14/2023)  Transportation Needs: No Transportation Needs (11/14/2023)  Utilities: Not At Risk (11/14/2023)  Financial Resource Strain: Low Risk  (10/19/2023)   Received from [facility]  Tobacco Use: Medium Risk (11/15/2023)    Readmission Risk Interventions    11/14/2023   10:15 AM 08/14/2022    4:37 PM  Readmission Risk Prevention Plan  Post Dischage Appt  Complete  Medication Screening  Complete  Transportation Screening Complete Complete  Medication Review (RN Care Manager) Complete   HRI or Home Care Consult Complete   Palliative Care Screening Not Applicable   Skilled Nursing Facility Not Applicable

## 2023-11-25 NOTE — Progress Notes (Signed)
 Progress Note   Patient: Kayla Schmitt FMW:[id number] DOB: April 07, 1966 DOA: 11/12/2023     13 DOS: the patient was seen and examined on 11/25/2023   Brief hospital course: Kayla Schmitt was admitted to the hospital with the working diagnosis of heart failure exacerbation complicated with renal failure.   58 yo female with the past medical history of aortic stenosis, coronary artery disease, mitral regurgitation, heart failure, peripheral vascular disease and hypertension who presented with dyspnea.  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 She was discharged to CIR. On 07/15 she was found volume overloaded, worsening peripheral edema, positive dyspnea and 02 saturation in the 80's. Her blood pressure was 117/72, HR 123, RR 25 and 02 saturation 94% on supplemental 02 per [place].  Lungs with bilateral rales, increased work of breathing, heart with S1 and S2 present and regular, positive systolic murmur at the base, abdomen with no distention and positive right lower extremity edema.    7/16 admission 7/17 ICU, intubated 7/18 started spiking fever with Tmax 101.5, tachycardic.  X-ray chest still showing bilateral infiltrate but FiO2 is coming down.  Lasix  infusion increased to 30 mg/h 7/19 started on CRRT, tolerating spontaneous breathing trial 7/20 extubated 7/22 CRRT holiday, Levophed /dobutamine  7/23 lasix  challenge, transfused 1u PRBC, remains low dose NE, DBA 2.5, CVP 7 7/24 laisx, off DBA  07/26 transferred to TRH.  07/27 renal function improving.  07/28 improved volume status, noted high BUN.   Assessment and Plan: * Acute on chronic systolic CHF (congestive heart failure) (HCC) 07/18 echocardiogram with reduced LV systolic function to 25%, mild to moderate MR, mild AS.  07/23 limited echocardiogram with reduced LV systolic function with EF 25 to 30%, global hypokinesis, mild to moderate MR, moderate to  severe aortic stenosis,   Cardiogenic shock, recovering.   07/17 unable to do cardiac catheterization due to pulmonary edema.   Patient required vasopressors and inotropic support with norepinephrine  and dobutamine , off 07/24.   Today urine output is 1,150 ml Systolic blood pressure is 95 mmHg range.   Transitioned to po torsemide  80 mg bid.  Holding on afterload reduction due to risk of hypotension.   Acute hypoxemia and hypercapnic respiratory failure due to acute cardiogenic pulmonary edema and aspiration pneumonia.  Liberated from invasive mechanical ventilation on 07/20.  Completed 7 days of antibiotic therapy. 02 saturation 96% on room air.   CAD (coronary artery disease) No chest pain, no acute coronary syndrome.   Hypertension Holding antihypertensive medications due to risk of hypotension.   Chronic kidney disease, stage 3a (HCC) AKI, hyponatremia, hypokalemia, hypomagnesemia urinary retention.   Patient required renal replacement therapy 07/22 discontinued CRRT.  07/24 urinary retention (650 ml) placed foley.  07/26 Foley removed last night, check bladder scan.  07/27 tolerating well off foley.   Renal function today with serum cr at 2,31 with K at 4,0 and serum bicarbonate at 25  Na 134 and Mg 1.6  BUN 91 from 71   Continue loop diuretic therapy with torsemide  Add 4 g Mag sulfate  Follow up renal function, recovery BUN (azotemia)   Type 2 diabetes mellitus with hyperlipidemia (HCC) Hyperglycemia  Continue glucose cover and monitoring with insulin  sliding scale.  Basal insulin   Fasting glucose today 91 mg /dl   Continue statin therapy.  S/P BKA (below knee amputation) unilateral, left (HCC) Right foot skin ulceration.   Recent MRSA bacteremia, completed daptomycin  and 7 days of linezolid  with Zosyn ,  on 07/24   Hypothyroidism Continue levothyroxine    GERD (gastroesophageal reflux disease) Continue with ppi  Iron  deficiency anemia 07/23 transfused  one unit PRBC.  Follow up hgb is 8,1      Subjective: Patient is feeling well, this morning is out of bed to the chair, no chest pain and no dyspnea, edema continue to improve.   Physical Exam: Vitals:   11/24/23 1956 11/25/23 0012 11/25/23 0449 11/25/23 0747  BP: (!) 92/58 (!) 102/58 91/66 (!) 86/48  Pulse: 86 99 100 96  Resp: 18 19 20  (!) 21  Temp: 98.9 F (37.2 C) 98.2 F (36.8 C) 98.4 F (36.9 C) 98.6 F (37 C)  TempSrc: Oral Oral Oral Oral  SpO2:  94% 93% 99%  Weight:   85.1 kg   Height:       Neurology awake and alert,  ENT with mild pallor Cardiovascular with S1 and S2 present and regular, positive systolic murmur at the apex, with no gallops or rubs No JVD Respiratory with no rales or wheezing, no rhonchi  Abdomen with no distention  Left BKA, right lower extremity with trace non pitting edema  Data Reviewed:    Family Communication: I spoke with patient's mother at the bedside, we talked in detail about patient's condition, plan of care and prognosis and all questions were addressed.   Disposition: Status is: Inpatient Remains inpatient appropriate because: pending renal recovery   Planned Discharge Destination: Rehab    Author: Elidia Toribio Furnace, MD 11/25/2023 8:34 AM  For on call review [email or web address].

## 2023-11-25 NOTE — Progress Notes (Signed)
 Patient ID: Kayla Schmitt, female   DOB: 01-May-1965, 58 y.o.   MRN: [id number] [facility] Progress Note   Assessment/ Plan:   1. Acute kidney Injury on chronic kidney disease stage III (baseline creatinine appears 1.3-1.6): Hemodynamically mediated in the setting of acute exacerbation of congestive heart failure, transiently on CRRT that was discontinued on 7/22.  She is continue maintain decent urine output and gradually downtrending creatinine with ongoing diuretic support.  Transitioned from IV furosemide  80 mg to torsemide  80 mg twice daily on 7/27.  Cumulative 3.9 L net negative during this hospitalization. UOP 1.2L/24h but not everything has been measured.  There were problems with the pure wick on 7/27 and then some of the urine output via the commode was not measured.  Will hold the torsemide  for today; likely will be to restart tomorrow.  Azotemia worsening.  Hemodialysis catheter can be removed in the next 24 to 48 hours if she continues to have evidence of renal recovery; would hold off for today. 2.  Acute exacerbation of congestive heart failure/cardiogenic shock: History of congestive heart failure with reduced ejection fraction and decompensation noted during this hospitalization.  Refractory to diuretics in the setting of inotropic/pressor support and started on CRRT transiently for extracorporeal volume unloading.  Fair urine output augmented by ongoing diuresis.  No indication for dialysis. 3.  Hyponatremia: Hypervolemic hyponatremia in the setting of acute kidney injury/CHF exacerbation, monitor with diuresis and renal recovery. 4.  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  Continue antibiotics per primary service. 5.  Anemia: Secondary to critical illness and prior history of GI bleed.  PRBC transfusion done yesterday with stable H/H overnight.  Subjective:   Denies any acute  complaints at this time and encouraged to hear about her improving kidney function. Mother  bedside updated as well   Objective:   BP (!) 86/48 (BP Location: Right Arm)   Pulse 96   Temp 98.6 F (37 C) (Oral)   Resp (!) 21   Ht 5' 4 (1.626 m)   Wt 85.1 kg   SpO2 99%   BMI 32.20 kg/m   Intake/Output Summary (Last 24 hours) at 11/25/2023 0757 Last data filed at 11/25/2023 0459 Gross per 24 hour  Intake 130 ml  Output 1150 ml  Net -1020 ml   Weight change: -0.3 kg  Physical Exam: Gen: Comfortably resting in bed, watching television. CVS: Pulse regular rhythm, normal rate S1 and S2 normal Resp: No audible rales/rhonchi at this time, poor inspiratory effort Abd: Soft, obese, nontender, bowel sounds normal Ext: Status post left BKA and right forefoot amputation.  Trace edema over dependent areas..  Imaging: No results found.   Labs: BMET Recent Labs  Lab 11/18/23 1457 11/19/23 0304 11/19/23 1310 11/20/23 0520 11/20/23 1655 11/21/23 0535 11/22/23 0512 11/23/23 0510 11/24/23 0841 11/25/23 0209  NA 135 131*   < > 131* 131* 129* 130* 133* 132* 134*  K 4.2 4.1   < > 4.0 4.0 3.8 3.5 3.1* 3.8 4.0  CL 101 97*   < > 97* 97* 95* 95* 97* 96* 97*  CO2 25 22   < > 23 21* 20* 22 25 22 25   GLUCOSE 164* 140*   < > 126* 106* 128* 121* 120* 201* 154*  BUN 18 14   < > 28* 37* 42* 61* 71* 79* 91*  CREATININE 1.42* 1.35*   < > 2.23* 2.69* 3.01* 2.98* 2.88* 2.75*  2.31*  CALCIUM  8.2* 8.0*   < > 8.2* 8.3* 8.2* 8.2* 8.4* 8.8* 8.7*  PHOS 2.7 2.3*  --   --   --  4.8* 4.8* 4.7* 4.2 3.4   < > = values in this interval not displayed.   CBC Recent Labs  Lab 11/19/23 0304 11/20/23 0520 11/20/23 1655 11/21/23 0535 11/22/23 0512 11/23/23 0510 11/25/23 0209  WBC 17.5* 13.1*   < > 11.8* 9.7 10.0 9.6  NEUTROABS 12.4* 8.5*  --  7.1 5.7  --   --   HGB 7.4* 6.9*   < > 8.4* 8.1* 7.7* 8.1*  HCT 23.3* 22.8*   < > 26.4* 25.2* 24.2* 25.6*  MCV 90.7 91.9   < > 89.5 90.0 90.6 90.8  PLT 486* 357    < > 301 236 250 222   < > = values in this interval not displayed.    Medications:     aspirin   81 mg Oral Daily   atorvastatin   40 mg Oral Daily   bethanechol   10 mg Oral TID   Chlorhexidine  Gluconate Cloth  6 each Topical Daily   docusate sodium   100 mg Oral BID   feeding supplement  237 mL Oral Q1400   gabapentin   300 mg Oral QHS   heparin  injection (subcutaneous)  5,000 Units Subcutaneous Q8H   insulin  aspart  0-15 Units Subcutaneous TID WC   insulin  glargine-yfgn  6 Units Subcutaneous BID   levothyroxine   150 mcg Oral Q0600   multivitamin  1 tablet Oral QHS   nutrition supplement (JUVEN)  1 packet Oral BID BM   oxidized cellulose  1 each Topical Once   pantoprazole   40 mg Oral Daily   sodium chloride  flush  10-40 mL Intracatheter Q12H   torsemide   80 mg Oral BID

## 2023-11-26 LAB — URINALYSIS, ROUTINE W REFLEX MICROSCOPIC

## 2023-11-26 LAB — GLUCOSE, CAPILLARY
Glucose-Capillary: 136 mg/dL — ABNORMAL HIGH (ref 70–99)
Glucose-Capillary: 165 mg/dL — ABNORMAL HIGH (ref 70–99)
Glucose-Capillary: 167 mg/dL — ABNORMAL HIGH (ref 70–99)
Glucose-Capillary: 227 mg/dL — ABNORMAL HIGH (ref 70–99)

## 2023-11-26 LAB — URINALYSIS, MICROSCOPIC (REFLEX): RBC / HPF: >50 RBC/hpf (ref 0–5)

## 2023-11-26 LAB — RENAL FUNCTION PANEL
Albumin: 2.7 g/dL — ABNORMAL LOW (ref 3.5–5.0)
Anion gap: 14 (ref 5–15)
BUN: 93 mg/dL — ABNORMAL HIGH (ref 6–20)
CO2: 25 mmol/L (ref 22–32)
Calcium: 8.8 mg/dL — ABNORMAL LOW (ref 8.9–10.3)
Chloride: 96 mmol/L — ABNORMAL LOW (ref 98–111)
Creatinine, Ser: 2.1 mg/dL — ABNORMAL HIGH (ref 0.44–1.00)
GFR, Estimated: 27 mL/min — ABNORMAL LOW (ref >60)
Glucose, Bld: 148 mg/dL — ABNORMAL HIGH (ref 70–99)
Phosphorus: 3.1 mg/dL (ref 2.5–4.6)
Potassium: 3.5 mmol/L (ref 3.5–5.1)
Sodium: 135 mmol/L (ref 135–145)

## 2023-11-26 LAB — MAGNESIUM: Magnesium: 2.5 mg/dL — ABNORMAL HIGH (ref 1.7–2.4)

## 2023-11-26 LAB — NA AND K (SODIUM & POTASSIUM), RAND UR
Potassium Urine: 28 mmol/L
Sodium, Ur: 33 mmol/L

## 2023-11-26 LAB — CREATININE, URINE, RANDOM: Creatinine, Urine: 66 mg/dL

## 2023-11-26 MED ORDER — JUVEN PO PACK
1.0000 | PACK | Freq: Two times a day (BID) | ORAL | Status: DC
  Administered 2023-11-26 – 2023-12-01 (×4): 1 via ORAL
  Filled 2023-11-26 (×7): qty 1

## 2023-11-26 MED ORDER — TORSEMIDE 20 MG PO TABS
80.0000 mg | ORAL_TABLET | Freq: Two times a day (BID) | ORAL | Status: DC
  Administered 2023-11-26 – 2023-11-29 (×7): 80 mg via ORAL
  Filled 2023-11-26 (×7): qty 4

## 2023-11-26 MED ORDER — ADULT MULTIVITAMIN W/MINERALS CH
1.0000 | ORAL_TABLET | Freq: Every day | ORAL | Status: DC
  Administered 2023-11-26 – 2023-12-06 (×8): 1 via ORAL
  Filled 2023-11-26 (×8): qty 1

## 2023-11-26 MED ORDER — ENSURE PLUS HIGH PROTEIN PO LIQD
237.0000 mL | Freq: Every day | ORAL | Status: DC
  Administered 2023-11-26 – 2023-11-29 (×2): 237 mL via ORAL

## 2023-11-26 NOTE — Progress Notes (Signed)
 Progress Note   Patient: Kayla Schmitt FMW:[id number] DOB: [birth year] DOA: 11/12/2023     14 DOS: the patient was seen and examined on 11/26/2023   Brief hospital course: Mrs. Ercole was admitted to the hospital with the working diagnosis of heart failure exacerbation complicated with renal failure.   58 yo female with the past medical history of aortic stenosis, coronary artery disease, mitral regurgitation, heart failure, peripheral vascular disease and hypertension who presented with dyspnea.  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 She was discharged to CIR. On 07/15 she was found volume overloaded, worsening peripheral edema, positive dyspnea and 02 saturation in the 80's. Her blood pressure was 117/72, HR 123, RR 25 and 02 saturation 94% on supplemental 02 per [place].  Lungs with bilateral rales, increased work of breathing, heart with S1 and S2 present and regular, positive systolic murmur at the base, abdomen with no distention and positive right lower extremity edema.    7/16 admission 7/17 ICU, intubated 7/18 started spiking fever with Tmax 101.5, tachycardic.  X-ray chest still showing bilateral infiltrate but FiO2 is coming down.  Lasix  infusion increased to 30 mg/h 7/19 started on CRRT, tolerating spontaneous breathing trial 7/20 extubated 7/22 CRRT holiday, Levophed /dobutamine  7/23 lasix  challenge, transfused 1u PRBC, remains low dose NE, DBA 2.5, CVP 7 7/24 laisx, off DBA  07/26 transferred to TRH.  07/27 renal function improving.  07/28 improved volume status, noted high BUN.  07/29 pending improvement in BUN   Assessment and Plan: * Acute on chronic systolic CHF (congestive heart failure) (HCC) 07/18 echocardiogram with reduced LV systolic function to 25%, mild to moderate MR, mild AS.  07/23 limited echocardiogram with reduced LV systolic function with EF 25 to 30%, global hypokinesis,  mild to moderate MR, moderate to severe aortic stenosis,   Cardiogenic shock, recovering.   07/17 unable to do cardiac catheterization due to pulmonary edema.   Patient required vasopressors and inotropic support with norepinephrine  and dobutamine , off 07/24.   Improved volume status, negative fluid balance.  Systolic blood pressure is 100 mmHg range.   Continue with torsemide  80 mg bid.  Holding on afterload reduction due to risk of hypotension.   Acute hypoxemia and hypercapnic respiratory failure due to acute cardiogenic pulmonary edema and aspiration pneumonia.  Liberated from invasive mechanical ventilation on 07/20.  Completed 7 days of antibiotic therapy. 02 saturation 98% on room air.   CAD (coronary artery disease) No chest pain, no acute coronary syndrome.   Hypertension Holding antihypertensive medications due to risk of hypotension.   Chronic kidney disease, stage 3a (HCC) AKI, hyponatremia, hypokalemia, hypomagnesemia urinary retention.   Patient required renal replacement therapy 07/22 discontinued CRRT.  07/24 urinary retention (650 ml) placed foley.  07/26 Foley removed last night, check bladder scan.  07/27 tolerating well off foley.   Serum cr continue to tend down to 2,10 with K at 3,5 and serum bicarbonate at 25  Na 135 and Mg 2.5  P 3.1 BUN 93 from 91   Continue loop diuretic therapy with torsemide  Follow up renal function, pending recovery BUN (azotemia)   Type 2 diabetes mellitus with hyperlipidemia (HCC) Hyperglycemia  Continue glucose cover and monitoring with insulin  sliding scale.  Basal insulin   Fasting glucose today 96 mg /dl   Continue statin therapy.  S/P BKA (below knee amputation) unilateral, left (HCC) Right foot skin ulceration.   Recent MRSA bacteremia, completed daptomycin  and 7 days of  linezolid  with Zosyn , on 07/24   Hypothyroidism Continue levothyroxine    GERD (gastroesophageal reflux disease) Continue with ppi  Iron   deficiency anemia 07/23 transfused one unit PRBC.  Follow up hgb is 8,1      Subjective: Patient with no chest pain or dyspnea, out of bed and anxious to go home   Physical Exam: Vitals:   11/26/23 0400 11/26/23 0500 11/26/23 0738 11/26/23 1149  BP: 109/62  104/60 100/61  Pulse: 96  97   Resp: 16  14 15   Temp: 97.8 F (36.6 C)  97.8 F (36.6 C) 97.8 F (36.6 C)  TempSrc: Oral  Oral Oral  SpO2: 94%  98% 98%  Weight:  86.7 kg    Height:       Neurology awake and alert ENT with mild pallor Cardiovascular with S1 and S2 present and regular with no gallops, or rubs, systolic murmur at the apex.  Respiratory with no rales or wheezing, no rhonchi  Abdomen with no distention.  Left BKA  Right lower extremity with trace non pitting edema.  Data Reviewed:    Family Communication: I spoke with patient's mother at the bedside, we talked in detail about patient's condition, plan of care and prognosis and all questions were addressed.   Disposition: Status is: Inpatient Remains inpatient appropriate because: pending renal function recovery   Planned Discharge Destination: Home    Author: Elidia Toribio Furnace, MD 11/26/2023 3:47 PM  For on call review [email or web address].

## 2023-11-26 NOTE — Progress Notes (Signed)
 Occupational Therapy Treatment Patient Details Name: Kayla Schmitt MRN: [id number] DOB: 06/09/1965 Today's Date: 11/26/2023   History of present illness 58 y.o female admitted 7/15 from AIR for SOB, CHF exacerbation. 7/17 attempted RHC but couldn't lie flat with pulmonary edema and decompensation requiring intubation. 7/19 CRRT. 7/20 extubation. PMHx: admission 6/22-7/1 for LLE pain, sepsis, necrotizing fasciitis. 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 CAD, aortic stenosis, HTN, CHF, PAD, bil transmet amputation, HLD, hypothyroidsim, T2DM   OT comments  Pt progressing well toward established OT goals. Per request from CIR, initiated discussion of HH vs CIR preferences. Pt prefers home with assist from family and HH therapies. Discussion initiated of equipment  pt has at home as well as safety at home. Pt reports she plans to scoot transfer to all surfaces working on standing with [facility] therapists. Pt family has good set up prepared at home. Pt also with transfer board. Will plan to see pt tomorrow for wheelchair mobility and answer questions as needed. OT to continue to follow.       If plan is discharge home, recommend the following:  A lot of help with walking and/or transfers;A lot of help with bathing/dressing/bathroom   Equipment Recommendations  Other (comment) (per family, pt has all drop arm eqiupment; need to ensure pt has WC/cushion, drop arm BSC, potentially tub bench)    Recommendations for Other Services      Precautions / Restrictions Precautions Precautions: Fall;Other (comment) Recall of Precautions/Restrictions: Impaired Precaution/Restrictions Comments: R transmet amputation, L BKA Required Braces or Orthoses: Other Brace Other Brace: Limb guard Restrictions Weight Bearing Restrictions Per Provider Order: Yes LLE Weight Bearing Per Provider Order: Non weight bearing       Mobility Bed Mobility Overal bed mobility: Needs Assistance Bed Mobility: Supine to Sit Rolling: Supervision          General bed mobility comments: suoervision for safety; no physical assist needed. pt with decr awareness of lines    Transfers Overall transfer level: Needs assistance Equipment used: Ambulation equipment used Transfers: Sit to/from Stand, Bed to chair/wheelchair/BSC Sit to Stand: Min assist (stedy)          Lateral/Scoot Transfers: Contact guard assist General transfer comment: min A up from EOB with stedy and stedy flaps. able to scoot from one side of bed to other wisthout physical assist. Transfer via Lift Equipment: Stedy   Balance Overall balance assessment: Needs assistance Sitting-balance support: No upper extremity supported, Feet unsupported Sitting balance-Leahy Scale: Fair Sitting balance - Comments: able to perform LB ADL at EOB but unable to tolerate additional challenge   Standing balance support: Bilateral upper extremity supported, Reliant on assistive device for balance, During functional activity                               ADL either performed or assessed with clinical judgement   ADL Overall ADL's : Needs assistance/impaired                     Lower Body Dressing: Set up;Sitting/lateral leans Lower Body Dressing Details (indicate cue type and reason): to don R shoe; pt initially asking for help but with encouragement, performing on her own with increased time for problem solving but no cues Toilet Transfer: Contact guard assist;Transfer board;Minimal assistance (min A for STS) Toilet Transfer Details (indicate cue type and reason): CGA for lateral scoot           General ADL Comments:  mother present and supportive    Extremity/Trunk Assessment Upper Extremity Assessment Upper Extremity Assessment: Generalized weakness   Lower Extremity Assessment Lower Extremity Assessment: Defer to PT evaluation        Vision   Vision Assessment?: No apparent visual deficits   Perception Perception Perception: Within  Functional Limits   Praxis Praxis Praxis: WFL   Communication Communication Communication: No apparent difficulties   Cognition Arousal: Alert Behavior During Therapy: WFL for tasks assessed/performed Cognition: No apparent impairments                               Following commands: Impaired Following commands impaired: Only follows one step commands consistently, Follows multi-step commands inconsistently      Cueing   Cueing Techniques: Verbal cues, Gestural cues, Tactile cues  Exercises      Shoulder Instructions       General Comments VSS    Pertinent Vitals/ Pain       Pain Assessment Pain Assessment: No/denies pain  Home Living                                          Prior Functioning/Environment              Frequency           Progress Toward Goals  OT Goals(current goals can now be found in the care plan section)  Progress towards OT goals: Progressing toward goals  Acute Rehab OT Goals Patient Stated Goal: pt very motivated to go home OT Goal Formulation: With patient Time For Goal Achievement: 12/10/23 Potential to Achieve Goals: Good ADL Goals Pt Will Perform Lower Body Dressing: with supervision;sit to/from stand;sitting/lateral leans Pt Will Transfer to Toilet: with supervision;bedside commode;with transfer board;squat pivot transfer;stand pivot transfer Pt Will Perform Toileting - Clothing Manipulation and hygiene: with supervision;sitting/lateral leans  Plan      Co-evaluation                 AM-PAC OT 6 Clicks Daily Activity     Outcome Measure   Help from another person eating meals?: None Help from another person taking care of personal grooming?: A Little Help from another person toileting, which includes using toliet, bedpan, or urinal?: A Lot Help from another person bathing (including washing, rinsing, drying)?: A Lot Help from another person to put on and taking off regular  upper body clothing?: A Little Help from another person to put on and taking off regular lower body clothing?: A Little 6 Click Score: 17    End of Session Equipment Utilized During Treatment: Gait belt;Oxygen ;Other (comment) (2L; stedy)  OT Visit Diagnosis: Unsteadiness on feet (R26.81);Other abnormalities of gait and mobility (R26.89);Muscle weakness (generalized) (M62.81)   Activity Tolerance Patient tolerated treatment well   Patient Left in chair;with call bell/phone within reach;with chair alarm set;with family/visitor present   Nurse Communication Mobility status;Need for lift equipment        Time: 0817-0850 OT Time Calculation (min): 33 min  Charges: OT General Charges $OT Visit: 1 Visit OT Treatments $Self Care/Home Management : 8-22 mins $Therapeutic Activity: 8-22 mins  Kayla Schmitt, OTD, OTR/L [facility] Acute Rehabilitation Office: [phone]   Kayla Schmitt 11/26/2023, 12:03 PM

## 2023-11-26 NOTE — TOC Transition Note (Signed)
 Transition of Care [facility]) - Discharge Note   Patient Details  Name: Kayla Schmitt MRN: [id number] Date of Birth: 1965/05/04  Transition of Care [facility]) CM/SW Contact:  Justina Delcia Czar, RN Phone Number: [phone] 11/26/2023, 12:56 PM   Clinical Narrative:     HF IP CM spoke to pt and states she had [facility] in the past for [facility]. Contacted [facility] rep, [name] with new referral. Has wheelchair, rolling walker, crutches, shower chair, and ramp.   Will contact [employer] [facility] for portable tank for home.   Final next level of care: Home w Home Health Services Barriers to Discharge: No Barriers Identified   Patient Goals and CMS Choice Patient states their goals for this hospitalization and ongoing recovery are:: wants to remain independent CMS Medicare.gov Compare Post Acute Care list provided to:: Patient Choice offered to / list presented to : Patient      Discharge Placement                       Discharge Plan and Services Additional resources added to the After Visit Summary for     Discharge Planning Services: CM Consult Post Acute Care Choice: IP Rehab                    HH Arranged: RN, PT, OT [facility] Agency: [facility] Date [facility] Agency Contacted: 11/26/23 Time HH Agency Contacted: 1256 Representative spoke with at [facility] Agency: [name]  Social Drivers of Health (SDOH) Interventions SDOH Screenings   Food Insecurity: No Food Insecurity (11/14/2023)  Housing: Low Risk  (11/14/2023)  Transportation Needs: No Transportation Needs (11/14/2023)  Utilities: Not At Risk (11/14/2023)  Financial Resource Strain: Low Risk  (10/19/2023)   Received from [facility]  Tobacco Use: Medium Risk (11/15/2023)     Readmission Risk Interventions    11/14/2023   10:15 AM 08/14/2022    4:37 PM  Readmission Risk Prevention Plan  Post Dischage Appt  Complete  Medication Screening  Complete  Transportation Screening Complete Complete  Medication Review (RN Care Manager)  Complete   HRI or Home Care Consult Complete   Palliative Care Screening Not Applicable   Skilled Nursing Facility Not Applicable

## 2023-11-26 NOTE — Progress Notes (Signed)
 Inpatient Rehab Admissions Coordinator:     Pt. Prefers to dc home with HH. CIR will sign off.   Leita Kleine, MS, CCC-SLP Rehab Admissions Coordinator  [phone] (celll) [phone] (office)

## 2023-11-26 NOTE — Progress Notes (Addendum)
 Patient ID: Cherene Dobbins, female   DOB: [birth year], 58 y.o.   MRN: [id number]     Advanced Heart Failure Rounding Note  Cardiologist: Vishnu SHAUNNA Maywood, MD  AHF Consulting MD: Dr. Cherrie Chief Complaint: Acute on chronic HFpEF / RV Failure Patient Profile   58 y.o. female with history of PAD with prior SFA stents and bilateral TMA, severe 3v CAD, MR/AS, DM II, anemia, HTN, HLD, hypothyroidism.  Admitted in 6/25 with MRSA bactermia and necrotizing fasciitis s/p left BKA. Transferred back to [facility] from rehab d/t acute on chronic CHF with concern for low-output and acute respiratory failure.  Subjective:    7/18: aborted cath for acute respiratory distress; intubated; DBA started 7/19: started on CRRT 7/20: extubated 7/21: hgb 6.5 s/p 1u RBCs 7/23: CRRT stopped  3 urine occurrences. CVP 8-9 at 90 degrees. Cr 2.75>2.31>2.1 BUN 79>91>93  Feeling back to normal this morning. Sitting up in chair. No SOB, CP.   Objective:    Weight Range: 86.7 kg Body mass index is 32.81 kg/m.   Vital Signs:   Temp:  [97.6 F (36.4 C)-98.6 F (37 C)] 97.8 F (36.6 C) (07/29 0738) Pulse Rate:  [81-100] 97 (07/29 0738) Resp:  [14-26] 14 (07/29 0738) BP: (94-109)/(55-67) 104/60 (07/29 0738) SpO2:  [90 %-98 %] 98 % (07/29 0738) Weight:  [86.7 kg] 86.7 kg (07/29 0500) Last BM Date : 11/22/23  Weight change: Filed Weights   11/24/23 0345 11/25/23 0449 11/26/23 0500  Weight: 85.4 kg 85.1 kg 86.7 kg   Intake/Output:  Intake/Output Summary (Last 24 hours) at 11/26/2023 0939 Last data filed at 11/26/2023 0500 Gross per 24 hour  Intake 480 ml  Output --  Net 480 ml    Physical Exam    General: Jaundice appearing.  Cardiac: JVP flat. S1 and S2 present. No murmurs or rub. Extremities: Warm and dry.  Trace RLE ankle edema.  Neuro: Alert and oriented x3. Affect pleasant.   Telemetry   SR in 90s (personally reviewed)  Labs    CBC Recent Labs    11/25/23 0209  WBC 9.6  HGB  8.1*  HCT 25.6*  MCV 90.8  PLT 222   Basic Metabolic Panel Recent Labs    92/71/74 0209 11/26/23 0256  NA 134* 135  K 4.0 3.5  CL 97* 96*  CO2 25 25  GLUCOSE 154* 148*  BUN 91* 93*  CREATININE 2.31* 2.10*  CALCIUM  8.7* 8.8*  MG 1.6* 2.5*  PHOS 3.4 3.1   Liver Function Tests Recent Labs    11/25/23 0209 11/26/23 0256  ALBUMIN  2.6* 2.7*   BNP (last 3 results) Recent Labs    11/03/23 0112 11/07/23 1457 11/13/23 0521  BNP 793.8* 336.4* 1,626.5*   Medications:    Scheduled Medications:  aspirin   81 mg Oral Daily   atorvastatin   40 mg Oral Daily   bethanechol   10 mg Oral TID   Chlorhexidine  Gluconate Cloth  6 each Topical Daily   docusate sodium   100 mg Oral BID   feeding supplement  237 mL Oral Q1400   gabapentin   300 mg Oral QHS   heparin  injection (subcutaneous)  5,000 Units Subcutaneous Q12H   insulin  aspart  0-15 Units Subcutaneous TID WC   insulin  glargine-yfgn  6 Units Subcutaneous BID   levothyroxine   150 mcg Oral Q0600   multivitamin  1 tablet Oral QHS   nutrition supplement (JUVEN)  1 packet Oral BID BM   oxidized cellulose  1 each Topical Once  pantoprazole   40 mg Oral Daily   sodium chloride  flush  10-40 mL Intracatheter Q12H   torsemide   80 mg Oral BID   Infusions:   PRN Medications: acetaminophen , Gerhardt's butt cream, ipratropium-albuterol , lidocaine , mouth rinse, mouth rinse, oxyCODONE , polyethylene glycol, prochlorperazine , senna-docusate, sodium chloride  flush  Assessment/Plan   Acute on chronic HFpEF>Mixed Shock; Cardiogenic/Septic - TEE 6/25: EF 60-65%, RV okay, mild to mod MR, moderate-severe AS with mG 27 mmHg and AVA 1.07 cm2 - TTE 11/15/23 LVEF 25% - Taken for [facility] 7/17, however developed flash pulmonary edema. Unable to lie flat for procedure.  - Repeat echo with EF 25-30%, G2DD, mild to mod MR, mod-severe AS  - Started on Torsemide  80 mg bid. Diuresis per Nephrology. AKI, transiently required CVVH. Now off and nephrology has  been managing diuretics. UOP difficult to acquire. - GDMT limited by renal function and hypotension (SBP 90s generally).   2. Acute respiratory failure with hypoxia - CXR 7/20 with b/l diffuse airspace opacities, likely pulmonary edema, though given fevers also concern for PNA - resolved.    3. CAD - NSTEMI in 4/24. Multivessel CAD. CABG had been recommended but managed medically due to other active medical issues - Further ischemic eval pending renal function => no cath with AKI.  - continue aspirin  + statin   4. Valvular heart disease - Mild to moderate MR and moderate to severe AS on echo 6/25 - Repeat echo 7/25 with extremely poor windows, based on exam expect AS moderate-severe - Echo 11/20/23 with Vmax 3.21, DVT 0.26, mod to severe AS, mG 22 - However, she is not a TAVR or SAVR candidate currently so even if she has severe AS it would not change current management   5. MRSA bacteremia - Necrotizing fasciitis - S/p recent left BKA 6/25; S/p TMA in 4/24  6. Leukocytosis - resolved  7. PAD - hx SFA stents - On aspirin  + statin. Had been on plavix  but stopped d/t risk of bleeding.   8. Anemia - s/p 1 u RBC 7/14 - Hx GI bleed from AVMs, ? D/t aortic valve stenosis - hgb 6.5>1u RBC>7.4>6.9>1u RBC>8.4>8.1 - fecal occult negative - will need Fe infusions in OP   9. AKI on CKD IIIa - In setting of volume overload/low-output; volume now improved. - Started on CRRT 7/19. Now off 7/22.  - Some renal recovery - Making urine, creatinine trending down but BUN up, stable today.   - Nephrology managing appreciate recs.   [place] Lee, NP  11/26/2023, 9:39 AM  Advanced Heart Failure Team Pager [phone] (M-F; 7a - 5p)  Please contact [facility] for night-coverage after hours (5p -7a ) and weekends on [email or web address]  Patient seen with NP, I formulated the plan and agree with the above note.   Creatinine lower 2.31 => 2.1, BUN 91 => 93.  No complaints, CVP 8-9.  UOP not measured but  she says she has been urinating.   General: NAD Neck: JVP 8 cm , no thyromegaly or thyroid nodule.  Lungs: Clear to auscultation bilaterally with normal respiratory effort. CV: Nondisplaced PMI.  Heart regular S1/S2, no S3/S4, 3/6 SEM RUSB.  No peripheral edema.   Abdomen: Soft, nontender, no hepatosplenomegaly, no distention.  Skin: Intact without lesions or rashes.  Neurologic: Alert and oriented x 3.  Psych: Normal affect. Extremities: No clubbing or cyanosis. Left BKA.  HEENT: Normal.   Renal function stable to improved, starting torsemide  80 mg bid today (CVP 8-9).  Follow BMET closely.  If renal function remains stable, home soon.   Follow valvular heart disease over time, currently not TAVR/SAVR candidate.   Ezra Shuck 11/26/2023 1:56 PM

## 2023-11-26 NOTE — Progress Notes (Signed)
 Patient ID: Kayla Schmitt, female   DOB: 07-Dec-1965, 58 y.o.   MRN: [id number] [facility] Progress Note   Assessment/ Plan:   1. Acute kidney Injury on chronic kidney disease stage III (baseline creatinine appears 1.3-1.6): Hemodynamically mediated in the setting of acute exacerbation of congestive heart failure, transiently on CRRT that was discontinued on 7/22.  She is continue maintain decent urine output and gradually downtrending creatinine with ongoing diuretic support.  Transitioned from IV furosemide  80 mg to torsemide  80 mg twice daily on 7/27 (AM dose only on 7/28).  Cumulative 2.5 L net negative during this hospitalization. UOP 1.2L/24h but not everything has been measured.  There were problems with the pure wick on 7/27 and then some of the urine output via the commode was not measured.  Only rx AM dose of torsemide  on 7/28; UOP is not all being collected. Blood in canister but pt denies gross blood. Restart torsemide  today, recheck U/A, alb low but BOOST supplementation may be contributing to the catabolic state.   Overall renal function improving but certainly azotemic (multi factorial with CHF, protein supplementation, catabolic state)  Would keep the hemodialysis catheter in for now until closer to d/c.  2.  Acute exacerbation of congestive heart failure/cardiogenic shock: History of congestive heart failure with reduced ejection fraction and decompensation noted during this hospitalization.  Refractory to diuretics in the setting of inotropic/pressor support and started on CRRT transiently for extracorporeal volume unloading.  Fair urine output augmented by ongoing diuresis.  No indication for dialysis. 3.  Hyponatremia: Hypervolemic hyponatremia in the setting of acute kidney injury/CHF exacerbation, monitor with diuresis and renal recovery. 4.  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   Continue antibiotics per primary service. 5.  Anemia: Secondary to critical illness and prior history of GI bleed.  PRBC transfusion done yesterday with stable H/H overnight.  Subjective:   Denies any acute complaints at this time and encouraged to hear about her improving kidney function. Mother  bedside updated as well   Objective:   BP 104/60 (BP Location: Left Arm)   Pulse 97   Temp 97.8 F (36.6 C) (Oral)   Resp 14   Ht 5' 4 (1.626 m)   Wt 86.7 kg   SpO2 98%   BMI 32.81 kg/m   Intake/Output Summary (Last 24 hours) at 11/26/2023 0858 Last data filed at 11/26/2023 0500 Gross per 24 hour  Intake 480 ml  Output --  Net 480 ml   Weight change: 1.6 kg  Physical Exam: Gen: Comfortably resting in bed, watching television. CVS: Pulse regular rhythm, normal rate S1 and S2 normal Resp: No audible rales/rhonchi at this time, poor inspiratory effort Abd: Soft, obese, nontender, bowel sounds normal Ext: Status post left BKA and right forefoot amputation.  Trace edema over dependent areas..  Imaging: No results found.   Labs: BMET Recent Labs  Lab 11/20/23 1655 11/21/23 0535 11/22/23 0512 11/23/23 0510 11/24/23 0841 11/25/23 0209 11/26/23 0256  NA 131* 129* 130* 133* 132* 134* 135  K 4.0 3.8 3.5 3.1* 3.8 4.0 3.5  CL 97* 95* 95* 97* 96* 97* 96*  CO2 21* 20* 22 25 22 25 25   GLUCOSE 106* 128* 121* 120* 201* 154* 148*  BUN 37* 42* 61* 71* 79* 91* 93*  CREATININE 2.69* 3.01* 2.98* 2.88* 2.75* 2.31* 2.10*  CALCIUM  8.3* 8.2* 8.2* 8.4* 8.8* 8.7* 8.8*  PHOS  --  4.8* 4.8* 4.7*  4.2 3.4 3.1   CBC Recent Labs  Lab 11/20/23 0520 11/20/23 1655 11/21/23 0535 11/22/23 0512 11/23/23 0510 11/25/23 0209  WBC 13.1*   < > 11.8* 9.7 10.0 9.6  NEUTROABS 8.5*  --  7.1 5.7  --   --   HGB 6.9*   < > 8.4* 8.1* 7.7* 8.1*  HCT 22.8*   < > 26.4* 25.2* 24.2* 25.6*  MCV 91.9   < > 89.5 90.0 90.6 90.8  PLT 357   < > 301 236 250 222   < > = values in this interval not displayed.     Medications:     aspirin   81 mg Oral Daily   atorvastatin   40 mg Oral Daily   bethanechol   10 mg Oral TID   Chlorhexidine  Gluconate Cloth  6 each Topical Daily   docusate sodium   100 mg Oral BID   feeding supplement  237 mL Oral Q1400   gabapentin   300 mg Oral QHS   heparin  injection (subcutaneous)  5,000 Units Subcutaneous Q12H   insulin  aspart  0-15 Units Subcutaneous TID WC   insulin  glargine-yfgn  6 Units Subcutaneous BID   levothyroxine   150 mcg Oral Q0600   multivitamin  1 tablet Oral QHS   nutrition supplement (JUVEN)  1 packet Oral BID BM   oxidized cellulose  1 each Topical Once   pantoprazole   40 mg Oral Daily   sodium chloride  flush  10-40 mL Intracatheter Q12H

## 2023-11-26 NOTE — Progress Notes (Signed)
 Nutrition Follow-up  DOCUMENTATION CODES:   Not applicable  INTERVENTION:   Modify schedule of chocolate Ensure Plus High Protein po daily at in the AFTERNOON, each supplement provides 350 kcal and 20 grams of protein  Continue Juven BID, each packet provides 80 calories, 8 grams of carbohydrate, 2.5  grams of protein (collagen), 7 grams of L-arginine and 7 grams of L-glutamine; supplement contains CaHMB, Vitamins C, E, B12 and Zinc  to promote wound healing  Discussed importance of protein intake to promote wound healing and prevent loss of lean body mass  Modify to regular MVI w/ minerals   NUTRITION DIAGNOSIS:  Inadequate oral intake related to acute illness as evidenced by NPO status.   GOAL:  Patient will meet greater than or equal to 90% of their needs - progressing  MONITOR:  Vent status, Labs, Weight trends, TF tolerance, Skin  REASON FOR ASSESSMENT:   Consult, Ventilator Enteral/tube feeding initiation and management  ASSESSMENT:   58 yo female admitted with acute on chronic diastolic HF, cardigenic shock, AKI, moderate MR, moderate to severe aortic stenosis, MRSA bacteremia and acute respiratory failure requiring ventilation. 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  7/15 Admitted from CIR 7/17 Transferred to ICU, placed on BiPap and inotrope support, later intubated 7/18 TF initiated 7/19 TF at 30 ml/hr but held due to emesis, later restarted at 10 ml/hr 7/20 Extubated 7/21 CL diet, minimal intake with poor mentation  7/22 advanced to carb modified diet/thin liquids 7/23 CRRT stopped 7/25 transfer out of ICU  Pt transferred out ICU and stabilizing for discharge. Sitting up in chair during bedside visit today. Lunch meal just delivered. States she is anxious to get home and encouraged by her improvement.  Average Meal Intake 7/22: 10% x1 documented meal 7/26: 100% x3  documented meals 7/27: 100% x1 documented meal  Mentation has improved, which previously precluded adequate PO intake. States she has an appetite and was told to reduce protein intake. Reviewed nephrology's note. Attributing azotemia to Ensure Plus High Protein supplement. Pt receiving one per day, which accounts for 19% of her estimated protein needs. Given her increased estimated needs as it relates to wound healing, more than likely nutrition provision needs to be increased as she is taking in too little nutrition, which is contributing to muscle breakdown. This is also illustrated by her most recent nutrition-focused physical exam (1 week ago), which showed mild muscle depletion s/p fluid removal.   Encouraged continuation of protein supplement and to eat as much as possible along with Juven to promote wound healing. Also reiterated importance of mobilizing in the presence of adequate PO intake to promote preservation and building of lean body mass. She verbalizes understanding. She prefers Ensure supplement in the afternoons. Will modify schedule.    Admit Weight: 91.7 kg Current Weight: 86.7 kg  Some generalized trace edema, however not significant. Wt stable. Reports bowels are a bit sluggish, however reports taking stool softener which has been effective in the past. Trying to take as little pain medication as possible.   Drains/Lines: RIJ: temporary HD catheter UOP: not being measured; 3 unmeasured occurrences x24 hours  Has required magnesium  supplementation. Noted with Crt down trending w/ BUN trend increasing likely indicative of mild dehydration as chloride and sodium have also been running low.   Meds: docusate sodium , gabapentin , SSI Novolog , Semglee , levothyroxine , renal MVI, pantoprazole , torsemide   Drips: 4g Mg  sulfate x1  Labs:  Na+ 132>134>135 (wdl) K+ 3.1--->3.5 (wdl) Mg 1.6>1.6>2.5 (H) Cl 96>97>96 (L) BUN 79>91>93 (H) Crt 2.75>2.31>2.10 (H) GFR 20>24>27 (L) CBGs  148-154 x24 hours A1c 5.5 (09/2023)  Diet Order:   Diet Order             Diet Carb Modified Fluid consistency: Thin; Room service appropriate? Yes with Assist; Fluid restriction: 1500 mL Fluid  Diet effective now            EDUCATION NEEDS:  Not appropriate for education at this time  Skin:  Skin Integrity Issues:: Incisions Incisions: Recent L BKA, stitches still in place  Last BM:  7/22 loose stools, FMS removed  Height:  Ht Readings from Last 1 Encounters:  11/12/23 5' 4 (1.626 m)   Weight:  Wt Readings from Last 1 Encounters:  11/26/23 86.7 kg    BMI:  Body mass index is 32.81 kg/m.  Estimated Nutritional Needs:   Kcal:  1800-2000 kcals  Protein:  105-115 g  Fluid:  1.5L  Blair Deaner MS, RD, LDN Registered Dietitian Clinical Nutrition RD Inpatient Contact Info in Amion

## 2023-11-26 NOTE — Plan of Care (Signed)
  Problem: Clinical Measurements: Goal: Ability to maintain clinical measurements within normal limits will improve Outcome: Progressing Goal: Will remain free from infection Outcome: Progressing Goal: Diagnostic test results will improve Outcome: Progressing Goal: Respiratory complications will improve Outcome: Progressing Goal: Cardiovascular complication will be avoided Outcome: Progressing   Problem: Activity: Goal: Risk for activity intolerance will decrease Outcome: Progressing   Problem: Nutrition: Goal: Adequate nutrition will be maintained Outcome: Progressing   Problem: Coping: Goal: Level of anxiety will decrease Outcome: Progressing   Problem: Elimination: Goal: Will not experience complications related to bowel motility Outcome: Progressing Goal: Will not experience complications related to urinary retention Outcome: Progressing   Problem: Pain Managment: Goal: General experience of comfort will improve and/or be controlled Outcome: Progressing   Problem: Safety: Goal: Ability to remain free from injury will improve Outcome: Progressing   Problem: Skin Integrity: Goal: Risk for impaired skin integrity will decrease Outcome: Progressing   Problem: Coping: Goal: Ability to adjust to condition or change in health will improve Outcome: Progressing   Problem: Fluid Volume: Goal: Ability to maintain a balanced intake and output will improve Outcome: Progressing   Problem: Metabolic: Goal: Ability to maintain appropriate glucose levels will improve Outcome: Progressing   Problem: Nutritional: Goal: Maintenance of adequate nutrition will improve Outcome: Progressing Goal: Progress toward achieving an optimal weight will improve Outcome: Progressing   Problem: Skin Integrity: Goal: Risk for impaired skin integrity will decrease Outcome: Progressing   Problem: Tissue Perfusion: Goal: Adequacy of tissue perfusion will improve Outcome: Progressing    Problem: Activity: Goal: Ability to return to baseline activity level will improve Outcome: Progressing   Problem: Cardiovascular: Goal: Ability to achieve and maintain adequate cardiovascular perfusion will improve Outcome: Progressing

## 2023-11-27 ENCOUNTER — Other Ambulatory Visit (HOSPITAL_COMMUNITY): Payer: Self-pay

## 2023-11-27 LAB — CBC
HCT: 23.4 % — ABNORMAL LOW (ref 36.0–46.0)
HCT: 24.3 % — ABNORMAL LOW (ref 36.0–46.0)
Hemoglobin: 7.3 g/dL — ABNORMAL LOW (ref 12.0–15.0)
Hemoglobin: 7.9 g/dL — ABNORMAL LOW (ref 12.0–15.0)
MCH: 28.5 pg (ref 26.0–34.0)
MCH: 29.5 pg (ref 26.0–34.0)
MCHC: 31.2 g/dL (ref 30.0–36.0)
MCHC: 32.5 g/dL (ref 30.0–36.0)
MCV: 91.4 fL (ref 80.0–100.0)
MCV: 90.7 fL (ref 80.0–100.0)
Platelets: 218 K/uL (ref 150–400)
Platelets: 233 K/uL (ref 150–400)
RBC: 2.56 MIL/uL — ABNORMAL LOW (ref 3.87–5.11)
RBC: 2.68 MIL/uL — ABNORMAL LOW (ref 3.87–5.11)
RDW: 18.2 % — ABNORMAL HIGH (ref 11.5–15.5)
RDW: 18.7 % — ABNORMAL HIGH (ref 11.5–15.5)
WBC: 16.6 K/uL — ABNORMAL HIGH (ref 4.0–10.5)
WBC: 18.9 K/uL — ABNORMAL HIGH (ref 4.0–10.5)
nRBC: 0.5 % — ABNORMAL HIGH (ref 0.0–0.2)
nRBC: 0.4 % — ABNORMAL HIGH (ref 0.0–0.2)

## 2023-11-27 LAB — HEMOGLOBIN AND HEMATOCRIT, BLOOD
HCT: 23.7 % — ABNORMAL LOW (ref 36.0–46.0)
Hemoglobin: 7.4 g/dL — ABNORMAL LOW (ref 12.0–15.0)

## 2023-11-27 LAB — RENAL FUNCTION PANEL
Albumin: 2.8 g/dL — ABNORMAL LOW (ref 3.5–5.0)
Anion gap: 12 (ref 5–15)
BUN: 93 mg/dL — ABNORMAL HIGH (ref 6–20)
CO2: 26 mmol/L (ref 22–32)
Calcium: 9 mg/dL (ref 8.9–10.3)
Chloride: 96 mmol/L — ABNORMAL LOW (ref 98–111)
Creatinine, Ser: 2.06 mg/dL — ABNORMAL HIGH (ref 0.44–1.00)
GFR, Estimated: 28 mL/min — ABNORMAL LOW (ref >60)
Glucose, Bld: 166 mg/dL — ABNORMAL HIGH (ref 70–99)
Phosphorus: 2.6 mg/dL (ref 2.5–4.6)
Potassium: 3.6 mmol/L (ref 3.5–5.1)
Sodium: 134 mmol/L — ABNORMAL LOW (ref 135–145)

## 2023-11-27 LAB — COOXEMETRY PANEL
Carboxyhemoglobin: 1.8 % — ABNORMAL HIGH (ref 0.5–1.5)
Methemoglobin: 1.7 % — ABNORMAL HIGH (ref 0.0–1.5)
O2 Saturation: 56.3 %
Total hemoglobin: 7.5 g/dL — ABNORMAL LOW (ref 12.0–16.0)

## 2023-11-27 LAB — VITAMIN B12: Vitamin B-12: 714 pg/mL (ref 180–914)

## 2023-11-27 LAB — GLUCOSE, CAPILLARY
Glucose-Capillary: 156 mg/dL — ABNORMAL HIGH (ref 70–99)
Glucose-Capillary: 192 mg/dL — ABNORMAL HIGH (ref 70–99)
Glucose-Capillary: 188 mg/dL — ABNORMAL HIGH (ref 70–99)
Glucose-Capillary: 179 mg/dL — ABNORMAL HIGH (ref 70–99)

## 2023-11-27 LAB — MAGNESIUM: Magnesium: 2 mg/dL (ref 1.7–2.4)

## 2023-11-27 MED ORDER — ADULT MULTIVITAMIN W/MINERALS CH
1.0000 | ORAL_TABLET | Freq: Every day | ORAL | 0 refills | Status: DC
Start: 2023-11-28 — End: 2024-01-06
  Filled 2023-11-27: qty 30, 30d supply, fill #0

## 2023-11-27 MED ORDER — CEPHALEXIN 500 MG PO CAPS
500.0000 mg | ORAL_CAPSULE | Freq: Two times a day (BID) | ORAL | Status: DC
  Administered 2023-11-27: 500 mg via ORAL
  Filled 2023-11-27 (×2): qty 1

## 2023-11-27 MED ORDER — TRESIBA FLEXTOUCH 100 UNIT/ML ~~LOC~~ SOPN: 6.0000 [IU] | PEN_INJECTOR | Freq: Every day | SUBCUTANEOUS | Status: AC

## 2023-11-27 MED ORDER — TORSEMIDE 20 MG PO TABS
80.0000 mg | ORAL_TABLET | Freq: Two times a day (BID) | ORAL | 0 refills | Status: DC
  Filled 2023-11-27: qty 240, 30d supply, fill #0

## 2023-11-27 NOTE — Progress Notes (Signed)
 SATURATION QUALIFICATIONS: (This note is used to comply with regulatory documentation for home oxygen )  Patient Saturations on Room Air at Rest = 90%  Patient Saturations on Room Air with Exertion = 87%  Patient Saturations on 2 Liters of oxygen  with Exertion = 90% and above  Please briefly explain why patient needs home oxygen : Pt hypoxic on Room Air with exertional tasks (scooting OOB to drop arm wheelchair).

## 2023-11-27 NOTE — Progress Notes (Signed)
 Patient ID: Kayla Schmitt, female   DOB: [birth year], 58 y.o.   MRN: [id number] [facility] Progress Note   Assessment/ Plan:   1. Acute kidney Injury on chronic kidney disease stage III (baseline creatinine appears 1.3-1.6): Hemodynamically mediated in the setting of acute exacerbation of congestive heart failure, transiently on CRRT that was discontinued on 7/22.  She is continue maintain decent urine output and gradually downtrending creatinine with ongoing diuretic support.  Transitioned from IV furosemide  80 mg to torsemide  80 mg twice daily on 7/27 (AM dose only on 7/28).  Cumulative 2.5 L net negative during this hospitalization. UOP 1.2L/24h but not everything has been measured.  There were problems with the pure wick on 7/27 and then some of the urine output via the commode was not measured.  Only rx AM dose of torsemide  on 7/28; UOP is not all being collected. Blood in canister but pt denies gross blood. Restart torsemide  today, recheck U/A, alb low but BOOST supplementation may be contributing to the catabolic state.   Overall renal function improving but certainly azotemic (multi factorial with CHF, protein supplementation, catabolic state)  Remove the right IJ temporary catheter on 7/30 without any complication; pressure bandage applied.    Renal standpoint we can follow her as an outpatient closely.  I will schedule her for a clinic appointment in 2 weeks..  2.  Acute exacerbation of congestive heart failure/cardiogenic shock: History of congestive heart failure with reduced ejection fraction and decompensation noted during this hospitalization.  Refractory to diuretics in the setting of inotropic/pressor support and started on CRRT transiently for extracorporeal volume unloading.  Fair urine output augmented by ongoing diuresis.  No indication for dialysis. 3.  Hyponatremia: Hypervolemic hyponatremia in the setting of acute kidney injury/CHF exacerbation, monitor  with diuresis and renal recovery. 4.  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  Continue antibiotics per primary service. 5.  Anemia: Secondary to critical illness and prior history of GI bleed.  PRBC transfusion done yesterday with stable H/H overnight.  Subjective:   Denies any acute complaints at this time and encouraged to hear about her improving kidney function. Mother  bedside updated as well   Objective:   BP (!) 102/57 (BP Location: Right Arm)   Pulse 97   Temp 97.8 F (36.6 C) (Oral)   Resp (!) 21   Ht 5' 4 (1.626 m)   Wt 84.4 kg   SpO2 96%   BMI 31.94 kg/m   Intake/Output Summary (Last 24 hours) at 11/27/2023 1140 Last data filed at 11/27/2023 0438 Gross per 24 hour  Intake --  Output 600 ml  Net -600 ml   Weight change: -2.3 kg  Physical Exam: Gen: Comfortably resting in bed, watching television. CVS: Pulse regular rhythm, normal rate S1 and S2 normal Resp: No audible rales/rhonchi at this time, poor inspiratory effort Abd: Soft, obese, nontender, bowel sounds normal Ext: Status post left BKA and right forefoot amputation.  Trace edema over dependent areas..  Imaging: No results found.   Labs: BMET Recent Labs  Lab 11/21/23 0535 11/22/23 0512 11/23/23 0510 11/24/23 0841 11/25/23 0209 11/26/23 0256 11/27/23 0235  NA 129* 130* 133* 132* 134* 135 134*  K 3.8 3.5 3.1* 3.8 4.0 3.5 3.6  CL 95* 95* 97* 96* 97* 96* 96*  CO2 20* 22 25 22 25 25 26   GLUCOSE 128* 121* 120* 201* 154* 148* 166*  BUN 42* 61* 71* 79*  91* 93* 93*  CREATININE 3.01* 2.98* 2.88* 2.75* 2.31* 2.10* 2.06*  CALCIUM  8.2* 8.2* 8.4* 8.8* 8.7* 8.8* 9.0  PHOS 4.8* 4.8* 4.7* 4.2 3.4 3.1 2.6   CBC Recent Labs  Lab 11/21/23 0535 11/22/23 0512 11/23/23 0510 11/25/23 0209 11/27/23 0235  WBC 11.8* 9.7 10.0 9.6 16.6*  NEUTROABS 7.1 5.7  --   --   --   HGB 8.4* 8.1* 7.7* 8.1* 7.3*  HCT 26.4* 25.2* 24.2* 25.6*  23.4*  MCV 89.5 90.0 90.6 90.8 91.4  PLT 301 236 250 222 218    Medications:     aspirin   81 mg Oral Daily   atorvastatin   40 mg Oral Daily   bethanechol   10 mg Oral TID   Chlorhexidine  Gluconate Cloth  6 each Topical Daily   docusate sodium   100 mg Oral BID   feeding supplement  237 mL Oral Q1400   gabapentin   300 mg Oral QHS   heparin  injection (subcutaneous)  5,000 Units Subcutaneous Q12H   insulin  aspart  0-15 Units Subcutaneous TID WC   insulin  glargine-yfgn  6 Units Subcutaneous BID   levothyroxine   150 mcg Oral Q0600   multivitamin with minerals  1 tablet Oral Daily   nutrition supplement (JUVEN)  1 packet Oral BID AC & HS   oxidized cellulose  1 each Topical Once   pantoprazole   40 mg Oral Daily   sodium chloride  flush  10-40 mL Intracatheter Q12H   torsemide   80 mg Oral BID

## 2023-11-27 NOTE — Progress Notes (Addendum)
 Patient ID: Kayla Schmitt, female   DOB: 03-29-1966, 58 y.o.   MRN: [id number]     Advanced Heart Failure Rounding Note  Cardiologist: Vishnu SHAUNNA Maywood, MD  [facility] Consulting MD: Dr. Cherrie Chief Complaint: Acute on chronic HFpEF / RV Failure Patient Profile   58 y.o. female with history of PAD with prior SFA stents and bilateral TMA, severe 3v CAD, MR/AS, DM II, anemia, HTN, HLD, hypothyroidism.  Admitted in 6/25 with MRSA bactermia and necrotizing fasciitis s/p left BKA. Transferred back to [facility] from rehab d/t acute on chronic CHF with concern for low-output and acute respiratory failure.  Subjective:    7/18: aborted cath for acute respiratory distress; intubated; DBA started 7/19: started on CRRT 7/20: extubated 7/21: hgb 6.5 s/p 1u RBCs 7/23: CRRT stopped  - . CVP 6 at 90 degrees. Cr 2.75>2.31>2.1>2.06 BUN 79>91>93>93  Hgb 8.1>7.3  Feels ok this morning. Denies CP/SOB. Sitting on EOB asking about discharge.   Objective:    Weight Range: 84.4 kg Body mass index is 31.94 kg/m.   Vital Signs:   Temp:  [97.7 F (36.5 C)-98.7 F (37.1 C)] 97.8 F (36.6 C) (07/30 0740) Pulse Rate:  [89-96] 96 (07/30 0740) Resp:  [15-22] 18 (07/30 0740) BP: (93-105)/(52-64) 102/64 (07/30 0740) SpO2:  [96 %-99 %] 96 % (07/30 0740) Weight:  [84.4 kg] 84.4 kg (07/30 0437) Last BM Date : 11/24/23  Weight change: Filed Weights   11/25/23 0449 11/26/23 0500 11/27/23 0437  Weight: 85.1 kg 86.7 kg 84.4 kg   Intake/Output:  Intake/Output Summary (Last 24 hours) at 11/27/2023 0936 Last data filed at 11/27/2023 0438 Gross per 24 hour  Intake --  Output 900 ml  Net -900 ml    Physical Exam   General:  jaundiced and chronically ill appearing.  No respiratory difficulty Neck: JVD ~5 cm.  Cor: Regular rate & rhythm. No murmurs. Lungs: clear, diminished bases Extremities: trace R ankle edema  Neuro: alert & oriented x 3. Affect pleasant.   Telemetry   SR in 90s (personally  reviewed)  Labs    CBC Recent Labs    11/25/23 0209 11/27/23 0235  WBC 9.6 16.6*  HGB 8.1* 7.3*  HCT 25.6* 23.4*  MCV 90.8 91.4  PLT 222 218   Basic Metabolic Panel Recent Labs    92/70/74 0256 11/27/23 0235  NA 135 134*  K 3.5 3.6  CL 96* 96*  CO2 25 26  GLUCOSE 148* 166*  BUN 93* 93*  CREATININE 2.10* 2.06*  CALCIUM  8.8* 9.0  MG 2.5* 2.0  PHOS 3.1 2.6   Liver Function Tests Recent Labs    11/26/23 0256 11/27/23 0235  ALBUMIN  2.7* 2.8*   BNP (last 3 results) Recent Labs    11/03/23 0112 11/07/23 1457 11/13/23 0521  BNP 793.8* 336.4* 1,626.5*   Medications:    Scheduled Medications:  aspirin   81 mg Oral Daily   atorvastatin   40 mg Oral Daily   bethanechol   10 mg Oral TID   Chlorhexidine  Gluconate Cloth  6 each Topical Daily   docusate sodium   100 mg Oral BID   feeding supplement  237 mL Oral Q1400   gabapentin   300 mg Oral QHS   heparin  injection (subcutaneous)  5,000 Units Subcutaneous Q12H   insulin  aspart  0-15 Units Subcutaneous TID WC   insulin  glargine-yfgn  6 Units Subcutaneous BID   levothyroxine   150 mcg Oral Q0600   multivitamin with minerals  1 tablet Oral Daily   nutrition supplement (  JUVEN)  1 packet Oral BID AC & HS   oxidized cellulose  1 each Topical Once   pantoprazole   40 mg Oral Daily   sodium chloride  flush  10-40 mL Intracatheter Q12H   torsemide   80 mg Oral BID   Infusions:   PRN Medications: acetaminophen , Gerhardt's butt cream, ipratropium-albuterol , lidocaine , mouth rinse, mouth rinse, oxyCODONE , polyethylene glycol, prochlorperazine , senna-docusate, sodium chloride  flush  Assessment/Plan  Acute on chronic HFpEF>Mixed Shock; Cardiogenic/Septic - TEE 6/25: EF 60-65%, RV okay, mild to mod MR, moderate-severe AS with mG 27 mmHg and AVA 1.07 cm2 - TTE 11/15/23 LVEF 25% - Taken for [facility] 7/17, however developed flash pulmonary edema. Unable to lie flat for procedure.  - co-ox 56% - Repeat echo with EF 25-30%, G2DD, mild  to mod MR, mod-severe AS  - Started on Torsemide  80 mg bid. Diuresis per Nephrology. AKI, transiently required CVVH. Now off and nephrology has been managing diuretics. UOP difficult to acquire. - GDMT limited by renal function and hypotension (SBP 90s generally).  - Follow with nephrology about timing of removal of trialysis catheter  2. Acute respiratory failure with hypoxia - CXR 7/20 with b/l diffuse airspace opacities, likely pulmonary edema, though given fevers also concern for PNA - resolved.    3. CAD - NSTEMI in 4/24. Multivessel CAD. CABG had been recommended but managed medically due to other active medical issues - Further ischemic eval pending renal function => no cath with AKI.  - continue aspirin  + statin   4. Valvular heart disease - Mild to moderate MR and moderate to severe AS on echo 6/25 - Repeat echo 7/25 with extremely poor windows, based on exam expect AS moderate-severe - Echo 11/20/23 with Vmax 3.21, DVT 0.26, mod to severe AS, mG 22 - However, she is not a TAVR or SAVR candidate currently so even if she has severe AS it would not change current management   5. MRSA bacteremia - Necrotizing fasciitis - S/p recent left BKA 6/25; S/p TMA in 4/24  6. Leukocytosis - resolved  7. PAD - hx SFA stents - On aspirin  + statin. Had been on plavix  but stopped d/t risk of bleeding.   8. Anemia - s/p 1 u RBC 7/14 - Hx GI bleed from AVMs, ? D/t aortic valve stenosis - hgb 6.5>1u RBC>7.4>6.9>1u RBC>8.4>8.1>7.3 - Discussed with primary team, will repeat H&H - fecal occult negative - will need Fe infusions in OP   9. AKI on CKD IIIa - In setting of volume overload/low-output; volume now improved. - Started on CRRT 7/19. Now off 7/22.  - Some renal recovery - Making urine, SCr/ BUN stable.   - Nephrology managing appreciate recs.   May be ready for discharge today pending repeat H&H results and MD eval. Will arrange close f/u in AHF clinic. AHF meds at discharge:   ASA 81 mg daily, atorvastatin  40 mg daily, torsemide  80 mg BID (as long as nephrology ok with this dose)   Beckey LITTIE Coe, NP  11/27/2023, 9:36 AM  Advanced Heart Failure Team Pager [phone] (M-F; 7a - 5p)  Please contact [facility] for night-coverage after hours (5p -7a ) and weekends on [email or web address]  Patient seen with NP, I formulated the plan and agree with the above note.   I/Os not accurate but weight down. Creatinine lower at 2.06, BUN stable 93.  Co-ox 56%, CVP 6.   No dyspnea.   General: NAD Neck: No JVD, no thyromegaly or thyroid nodule.  Lungs: Clear to  auscultation bilaterally with normal respiratory effort. CV: Nondisplaced PMI.  Heart regular S1/S2, no S3/S4, 2/6 SEM RUSB.  1+ right ankle edema.   Abdomen: Soft, nontender, no hepatosplenomegaly, no distention.  Skin: Intact without lesions or rashes.  Neurologic: Alert and oriented x 3.  Psych: Normal affect. Extremities: No clubbing or cyanosis. Left BKA.  HEENT: Normal.   Renal function stable to improved, now on torsemide  80 mg bid (CVP 6).  Co-ox stable 56%.    Follow valvular heart disease over time, currently not TAVR/SAVR candidate.   Hgb lower at 7.3 without overt bleeding, repeating CBC.   From cardiac standpoint, I think she could go home with close followup.  See above for med regimen.     Ezra Shuck 11/27/2023 10:25 AM

## 2023-11-27 NOTE — Progress Notes (Signed)
 Discharged held due to leukocytosis, patient having dysuria. Started po antibiotic, follow up wbc in am.

## 2023-11-27 NOTE — Progress Notes (Signed)
 Occupational Therapy Treatment Patient Details Name: Kayla Schmitt MRN: [id number] DOB: 08-26-1965 Today's Date: 11/27/2023   History of present illness 58 y.o female admitted 7/15 from [facility] for SOB, CHF exacerbation. 7/17 attempted RHC but couldn't lie flat with pulmonary edema and decompensation requiring intubation. 7/19 CRRT. 7/20 extubation. PMHx: admission 6/22-7/1 for LLE pain, sepsis, necrotizing fasciitis. 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 CAD, aortic stenosis, HTN, CHF, PAD, bil transmet amputation, HLD, hypothyroidsim, T2DM.   OT comments  Pt progressing toward established OT goals. Focus session on LB ADL, pressure relief education, desensitization techniques, positioning, shrinker and limb guard wear schedule. Pt needing up to min A for LB ADL and transfers this session. Pt requests to stay in wheelchair at end of session, so will plan to come pick back up later. OT to continue to follow.       If plan is discharge home, recommend the following:  A lot of help with walking and/or transfers;A lot of help with bathing/dressing/bathroom   Equipment Recommendations  None recommended by OT    Recommendations for Other Services      Precautions / Restrictions Precautions Precautions: Fall;Other (comment) Recall of Precautions/Restrictions: Impaired Precaution/Restrictions Comments: Contact precs; R transmet amputation, L BKA Required Braces or Orthoses: Other Brace Other Brace: Limb guard (benefits from encouragement to don/maintain) Restrictions Weight Bearing Restrictions Per Provider Order: Yes LLE Weight Bearing Per Provider Order: Non weight bearing Other Position/Activity Restrictions: Date of surgery 6/24       Mobility Bed Mobility                    Transfers Overall transfer level: Needs assistance Equipment used: Sliding board              Lateral/Scoot Transfers: Contact guard assist, Min assist, +2 safety/equipment, With slide board General transfer  comment: Pt requesting her Mother to stand by and brace wheelchair for safety, initial visual/verbal cues for technique via slide board and safe UE placement, needs some repetition of cues for UE placement/body mechanics while scooting, light minA to CGA for bed pad assist and to avoid her curling her fingers under slide board.     Balance Overall balance assessment: Needs assistance Sitting-balance support: No upper extremity supported, Feet unsupported Sitting balance-Leahy Scale: Good                                     ADL either performed or assessed with clinical judgement   ADL Overall ADL's : Needs assistance/impaired                     Lower Body Dressing: Minimal assistance;Sitting/lateral leans Lower Body Dressing Details (indicate cue type and reason): pants/underpants Toilet Transfer: Contact guard assist;Transfer board                  Extremity/Trunk Assessment Upper Extremity Assessment Upper Extremity Assessment: Generalized weakness   Lower Extremity Assessment Lower Extremity Assessment: Defer to PT evaluation        Vision       Perception     Praxis     Communication Communication Communication: No apparent difficulties   Cognition Arousal: Alert Behavior During Therapy: WFL for tasks assessed/performed, Flat affect Cognition: No apparent impairments  Following commands: Impaired Following commands impaired: Only follows one step commands consistently, Follows multi-step commands inconsistently      Cueing   Cueing Techniques: Verbal cues, Gestural cues  Exercises Exercises: Other exercises Other Exercises Other Exercises: education provided regarding weight shifting/pressure relief, LB ADl, safety with transfers    Shoulder Instructions       General Comments BP WFL once in chair, pt needed 2L/min to perform exertional tasks, hypoxic on RA (per patient she did not  use O2 at home previously)    Pertinent Vitals/ Pain       Pain Assessment Pain Assessment: Faces Faces Pain Scale: Hurts a little bit Pain Location: generalized Pain Descriptors / Indicators: Grimacing Pain Intervention(s): Limited activity within patient's tolerance  Home Living                                          Prior Functioning/Environment              Frequency           Progress Toward Goals  OT Goals(current goals can now be found in the care plan section)  Progress towards OT goals: Progressing toward goals  Acute Rehab OT Goals Patient Stated Goal: get better OT Goal Formulation: With patient Time For Goal Achievement: 12/10/23 Potential to Achieve Goals: Good ADL Goals Pt Will Perform Lower Body Dressing: with supervision;sit to/from stand;sitting/lateral leans Pt Will Transfer to Toilet: with supervision;bedside commode;with transfer board;squat pivot transfer;stand pivot transfer Pt Will Perform Toileting - Clothing Manipulation and hygiene: with supervision;sitting/lateral leans  Plan      Co-evaluation                 AM-PAC OT 6 Clicks Daily Activity     Outcome Measure   Help from another person eating meals?: None Help from another person taking care of personal grooming?: A Little Help from another person toileting, which includes using toliet, bedpan, or urinal?: A Lot Help from another person bathing (including washing, rinsing, drying)?: A Lot Help from another person to put on and taking off regular upper body clothing?: A Little Help from another person to put on and taking off regular lower body clothing?: A Little 6 Click Score: 17    End of Session Equipment Utilized During Treatment: Oxygen ;Other (comment) (WC, slide board)  OT Visit Diagnosis: Unsteadiness on feet (R26.81);Other abnormalities of gait and mobility (R26.89);Muscle weakness (generalized) (M62.81)   Activity Tolerance Patient  tolerated treatment well   Patient Left in chair;with call bell/phone within reach;with chair alarm set;with family/visitor present   Nurse Communication Mobility status;Need for lift equipment        Time: 1207-1225 OT Time Calculation (min): 18 min  Charges: OT General Charges $OT Visit: 1 Visit OT Treatments $Self Care/Home Management : 8-22 mins  Kayla Schmitt, OTR/L [facility] Acute Rehabilitation Office: [phone]   Kayla JONETTA Lebron 11/27/2023, 1:10 PM

## 2023-11-27 NOTE — Discharge Summary (Addendum)
 Physician Discharge Summary   Patient: Kayla Schmitt MRN: [id number] DOB: 08-04-1965  Admit date:     11/12/2023  Discharge date: 11/27/23  Discharge Physician: Elidia Sieving Lavinia Mcneely   PCP: [name], NP   Recommendations at discharge:    Patient has been placed on torsemide  80 mg bid for diuresis  Limited guideline directed medical therapy for heart failure due to acute worsening GFR and risk of hypotension.  Added supplemental 02 per [place], follow 02 saturation as outpatient,  Follow up cell count, renal function and electrolytes as outpatient in 7 days Follow up with Jacquline Rosan NP in 7 to 10 days  Follow up with Cardiology as scheduled Follow up with Nephrology as scheduled.  Follow up with vascular surgery as scheduled.  Home health services   Discharge Diagnoses: Principal Problem:   Acute on chronic systolic CHF (congestive heart failure) (HCC) Active Problems:   CAD (coronary artery disease)   Hypertension   Chronic kidney disease, stage 3a (HCC)   Type 2 diabetes mellitus with hyperlipidemia (HCC)   Hypothyroidism   S/P BKA (below knee amputation) unilateral, left (HCC)   GERD (gastroesophageal reflux disease)   Iron  deficiency anemia  Resolved Problems:   * No resolved hospital problems. [facility] Course: Mrs. Hollifield was admitted to the hospital with the working diagnosis of heart failure exacerbation complicated with renal failure.   58 yo female with the past medical history of aortic stenosis, coronary artery disease, mitral regurgitation, heart failure, peripheral vascular disease and hypertension who presented with dyspnea.  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 She was discharged to CIR. On 07/15 she was found volume overloaded, worsening peripheral edema, positive dyspnea and 02 saturation in the 80's. Her blood pressure was 117/72, HR 123, RR 25 and 02  saturation 94% on supplemental 02 per [place].  Lungs with bilateral rales, increased work of breathing, heart with S1 and S2 present and regular, positive systolic murmur at the base, abdomen with no distention and positive right lower extremity edema.    Na 135, K 4.4 Cl 100 bicarbonate 23 glucose 181, BUN 24 cr 1.28  High sensitive troponin 489, 554, 671  Wbc 11.4 hgb 9,1 plt 496   EKG 105 bpm, normal axis, normal intervals qtc 452, sinus rhythm with no significant ST segment or T wave changes.   Chest radiograph with cardiomegaly with diffuse bilateral interstitial infiltrates.   7/16 admission 7/17 ICU, intubated 7/18 started spiking fever with Tmax 101.5, tachycardic.  X-ray chest still showing bilateral infiltrate but FiO2 is coming down.  Lasix  infusion increased to 30 mg/h 7/19 started on CRRT, tolerating spontaneous breathing trial 7/20 extubated 7/22 CRRT holiday, Levophed /dobutamine  7/23 lasix  challenge, transfused 1u PRBC, remains low dose NE, DBA 2.5, CVP 7 7/24 laisx, off DBA  07/26 transferred to [facility].  07/27 renal function improving.  07/28 improved volume status, noted high BUN.  07/29 pending improvement in BUN  07/30 BUN has stabilized.   Assessment and Plan: * Acute on chronic systolic CHF (congestive heart failure) (HCC) 07/18 echocardiogram with reduced LV systolic function to 25%, mild to moderate MR, mild AS.  07/23 limited echocardiogram with reduced LV systolic function with EF 25 to 30%, global hypokinesis, mild to moderate MR, moderate to severe aortic stenosis,   07/17 unable to do cardiac catheterization due to pulmonary edema.   Patient required vasopressors and inotropic support with norepinephrine  and dobutamine , off 07/24.  Cardiogenic shock has resolved.   Improved volume status, negative fluid balance, since admission -4.289 ml, with significant improvement in her symptoms.    Systolic blood pressure is 100 mmHg range.   Continue with torsemide   80 mg bid.  Holding on afterload reduction due to risk of hypotension.   Acute hypoxemia and hypercapnic respiratory failure due to acute cardiogenic pulmonary edema and aspiration pneumonia.  Liberated from invasive mechanical ventilation on 07/20.  Completed 7 days of antibiotic therapy. 02 saturation 96% on  2L min per [place], continue supplemental 02 at home, since room air 02 was 87%   Patient is not a TAVR or SAVR candidate.   CAD (coronary artery disease) No chest pain, no acute coronary syndrome.   Hypertension Holding antihypertensive medications due to risk of hypotension.   Chronic kidney disease, stage 3a (HCC) AKI, hyponatremia, hypokalemia, hypomagnesemia urinary retention.   Patient required renal replacement therapy 07/22 discontinued CRRT.  07/24 urinary retention (650 ml) placed foley.  07/26 Foley removed last night, check bladder scan.  07/27 tolerating well off foley.   At the time of her discharge her serum cr is 2.0 with K at 3,6 and serum bicarbonate at 26  Na 134  Mg 2.0 BUN 93 (stable)    Continue loop diuretic therapy with torsemide  Follow up renal function as outpatient   Type 2 diabetes mellitus with hyperlipidemia (HCC) Hyperglycemia  Patient was placed on insulin  sliding scale for glucose cover and monitoring.   Basal insulin   Fasting glucose today 166 mg /dl   Continue statin therapy.  S/P BKA (below knee amputation) unilateral, left (HCC) Right foot skin ulceration.   Recent MRSA bacteremia, completed daptomycin  and 7 days of linezolid  with Zosyn , on 07/24   Peripheral vascular disease.  08/02/2022 she underwent left SFA and above-knee popliteal percutaneous thrombectomy with JETI and additional angioplasty and stenting into the above-knee popliteal artery for an occluded distal SFA stent with tissue loss. She then on 08/06/2022 underwent laser arthrectomy of the right SFA above-knee popliteal with DCB for in-stent stenosis    Hypothyroidism Continue levothyroxine    GERD (gastroesophageal reflux disease) Continue with ppi  Iron  deficiency anemia 07/23 transfused one unit PRBC.  Follow up hgb is 7.4  B 12 658 Follow up cell count as outpatient.        Consultants: cardiology and nephrology, critical care  Procedures performed: central vein catheter,   Disposition: Home Diet recommendation:  Cardiac and Carb modified diet DISCHARGE MEDICATION: Allergies as of 11/27/2023       Reactions   Trental  [pentoxifylline ] Nausea And Vomiting   Vibramycin [doxycycline] Nausea And Vomiting        Medication List     STOP taking these medications    daptomycin  IVPB Commonly known as: CUBICIN    furosemide  40 MG tablet Commonly known as: LASIX    metoprolol  succinate 25 MG 24 hr tablet Commonly known as: TOPROL -XL   oxyCODONE  5 MG immediate release tablet Commonly known as: Roxicodone        TAKE these medications    acetaminophen  500 MG tablet Commonly known as: TYLENOL  Take 1,000 mg by mouth 2 (two) times daily as needed for moderate pain (pain score 4-6), headache or fever.   aspirin  EC 81 MG tablet Take 81 mg by mouth daily.   atorvastatin  40 MG tablet Commonly known as: LIPITOR Take 40 mg by mouth daily.   bisacodyl  10 MG suppository Commonly known as: DULCOLAX Place 1 suppository (10 mg total) rectally daily as  needed for moderate constipation or mild constipation.   clopidogrel  75 MG tablet Commonly known as: PLAVIX  TAKE 1 TABLET BY MOUTH EVERY DAY WITH BREAKFAST   ferrous sulfate  325 (65 FE) MG EC tablet Take 1 tablet (325 mg total) by mouth daily with breakfast.   gabapentin  300 MG capsule Commonly known as: NEURONTIN  Take 300 mg by mouth at bedtime.   levothyroxine  150 MCG tablet Commonly known as: SYNTHROID  Take 150 mcg by mouth daily before breakfast.   multivitamin with minerals Tabs tablet Take 1 tablet by mouth daily. Start taking on: November 28, 2023    nitroGLYCERIN  0.4 MG SL tablet Commonly known as: Nitrostat  Place 1 tablet (0.4 mg total) under the tongue every 5 (five) minutes x 3 doses as needed for chest pain (if no relief after 3rd dose proceed to ED or call 911). 11/06/2022-New   pantoprazole  40 MG tablet Commonly known as: PROTONIX  Take 40 mg by mouth daily.   Pharmacist Choice Alcohol Pads Apply topically.   polyethylene glycol 17 g packet Commonly known as: MIRALAX  / GLYCOLAX  Take 17 g by mouth 2 (two) times daily.   promethazine  25 MG tablet Commonly known as: PHENERGAN  Take 1 tablet (25 mg total) by mouth every 6 (six) hours as needed for nausea or vomiting.   senna-docusate 8.6-50 MG tablet Commonly known as: Senokot-S Take 1 tablet by mouth 2 (two) times daily.   torsemide  20 MG tablet Commonly known as: DEMADEX  Take 4 tablets (80 mg total) by mouth 2 (two) times daily.   Tresiba  FlexTouch 100 UNIT/ML FlexTouch Pen Generic drug: insulin  degludec Inject 6 Units into the skin at bedtime. What changed: how much to take   ZzzQuil 25 MG Caps Generic drug: diphenhydrAMINE  HCl (Sleep) Take 25 mg by mouth at bedtime as needed (Sleep).        Follow-up Information     Harden Jerona GAILS, MD Follow up in 2 week(s).   Specialty: Orthopedic Surgery Contact information: [address] [place]  [place] [address] [phone]         Rosan Standing B, NP Follow up.   Specialty: Nurse Practitioner Why: hospital follow up appt scheduled for 12/04/2023 at 1215 pm Contact information: [address] [place] [address] [phone]         [facility] Follow up on 12/09/2023.   Specialty: Cardiology Why: Follow up in [facility] 8/11 at 1030am Contact information: [address] [place]  [phone]               Discharge Exam: Filed Weights   11/25/23 0449 11/26/23 0500 11/27/23 0437  Weight: 85.1 kg  86.7 kg 84.4 kg   BP (!) 102/57 (BP Location: Right Arm)   Pulse 97   Temp 97.8 F (36.6 C) (Oral)   Resp (!) 21   Ht 5' 4 (1.626 m)   Wt 84.4 kg   SpO2 96%   BMI 31.94 kg/m   Patient is feeling better, has no dyspnea or chest pain, she has been out of bed and tolerating po well, her endurance has improved and no longer needs CIR   Neurology awake and alert ENT with mild pallor with no icterus Cardiovascular with S1 and S2 present and regular with no gallops or rubs, positive systolic murmur at the base.  Respiratory with no wheezing or rhonchi, positive rales at the left base Abdomen with no distention  Right lower extremity with trace pitting edema, (  trans metatarsal amputation) Left BKA   Condition at discharge: stable  The results of significant diagnostics from this hospitalization (including imaging, microbiology, ancillary and laboratory) are listed below for reference.   Imaging Studies: ECHOCARDIOGRAM LIMITED Result Date: 11/20/2023    ECHOCARDIOGRAM LIMITED REPORT   Patient Name:   Kayla Schmitt [facility] Date of Exam: 11/20/2023 Medical Rec #:  [id number]              Height:       64.0 in Accession #:    [id number]             Weight:       195.1 lb Date of Birth:  [birth year]               BSA:          1.936 m Patient Age:    57 years               BP:           102/66 mmHg Patient Gender: F                      HR:           72 bpm. Exam Location:  Inpatient Procedure: Limited Echo, Limited Color Doppler and Intracardiac Opacification            Agent (Both Spectral and Color Flow Doppler were utilized during            procedure). Indications:    CHF I50.9  History:        Patient has prior history of Echocardiogram examinations, most                 recent 11/15/2023. CHF, Previous Myocardial Infarction and CAD,                 Signs/Symptoms:Chest Pain; Risk Factors:Hypertension, Diabetes                 and Dyslipidemia.  Sonographer:    Thea Norlander RCS Sonographer#2:   Damien Senior RDCS Referring Phys: [id number] [place] LEE IMPRESSIONS  1. Left ventricular ejection fraction, by estimation, is 25 to 30%. The left ventricle has severely decreased function. The left ventricle demonstrates global hypokinesis. The left ventricular internal cavity size was mildly dilated. Left ventricular diastolic parameters are consistent with Grade II diastolic dysfunction (pseudonormalization). Elevated left atrial pressure.  2. The mitral valve is abnormal. Mild to moderate mitral valve regurgitation. Moderate mitral annular calcification.  3. There is moderate calcification of the aortic valve. There is moderate thickening of the aortic valve. Moderate to severe aortic valve stenosis. Aortic valve Vmax measures 3.21 m/s.  4. The inferior vena cava is dilated in size with <50% respiratory variability, suggesting right atrial pressure of 15 mmHg. FINDINGS  Left Ventricle: Left ventricular ejection fraction, by estimation, is 25 to 30%. The left ventricle has severely decreased function. The left ventricle demonstrates global hypokinesis. Definity  contrast agent was given IV to delineate the left ventricular endocardial borders. The left ventricular internal cavity size was mildly dilated. Left ventricular diastolic parameters are consistent with Grade II diastolic dysfunction (pseudonormalization). Elevated left atrial pressure. Mitral Valve: The mitral valve is abnormal. Moderate mitral annular calcification. Mild to moderate mitral valve regurgitation. Tricuspid Valve: The tricuspid valve is normal in structure. Tricuspid valve regurgitation is trivial. Aortic Valve: Suspect primarily moderate AS, Vmax 3.25m/s with DVI 0.26. There is moderate calcification of the aortic valve.  There is moderate thickening of the aortic valve. Moderate to severe aortic stenosis is present. Aortic valve mean gradient measures 22.0 mmHg. Aortic valve peak gradient measures 41.2 mmHg. Aortic valve area, by VTI measures  0.82 cm. Pulmonic Valve: The pulmonic valve was normal in structure. Pulmonic valve regurgitation is trivial. Venous: The inferior vena cava is dilated in size with less than 50% respiratory variability, suggesting right atrial pressure of 15 mmHg. LEFT VENTRICLE PLAX 2D LVIDd:         5.60 cm   Diastology LVIDs:         5.00 cm   LV e' medial:    6.64 cm/s LV PW:         0.90 cm   LV E/e' medial:  21.4 LV IVS:        0.80 cm   LV e' lateral:   8.05 cm/s LVOT diam:     1.90 cm   LV E/e' lateral: 17.6 LV SV:         39 LV SV Index:   20 LVOT Area:     2.84 cm  RIGHT VENTRICLE         IVC TAPSE (M-mode): 2.0 cm  IVC diam: 2.70 cm LEFT ATRIUM         Index LA diam:    4.10 cm 2.12 cm/m  AORTIC VALVE AV Area (Vmax):    0.74 cm AV Area (Vmean):   0.96 cm AV Area (VTI):     0.82 cm AV Vmax:           321.00 cm/s AV Vmean:          185.500 cm/s AV VTI:            0.480 m AV Peak Grad:      41.2 mmHg AV Mean Grad:      22.0 mmHg LVOT Vmax:         83.60 cm/s LVOT Vmean:        63.000 cm/s LVOT VTI:          0.138 m LVOT/AV VTI ratio: 0.29  AORTA Ao Root diam: 2.90 cm MITRAL VALVE MV Area (PHT): 4.49 cm     SHUNTS MV Decel Time: 169 msec     Systemic VTI:  0.14 m MV E velocity: 142.00 cm/s  Systemic Diam: 1.90 cm MV A velocity: 84.20 cm/s MV E/A ratio:  1.69 Morene Brownie Electronically signed by Morene Brownie Signature Date/Time: 11/20/2023/5:15:20 PM    Final    DG Abd 1 View Result Date: 11/19/2023 CLINICAL DATA:  Diarrhea. EXAM: ABDOMEN - 1 VIEW COMPARISON:  11/14/2023. FINDINGS: Nonobstructive bowel gas pattern. Scattered gas and stool in the colon. No acute osseous abnormality. IMPRESSION: Nonobstructive bowel gas pattern. Scattered gas and stool in the colon. Electronically Signed   By: Harrietta Sherry M.D.   On: 11/19/2023 12:38   DG CHEST PORT 1 VIEW Result Date: 11/17/2023 CLINICAL DATA:  [id number] Acute exacerbation of CHF (congestive heart failure) (HCC) [id number] EXAM: PORTABLE CHEST - 1 VIEW  COMPARISON:  11/16/2023 FINDINGS: Endotracheal tube, gastric tube, right IJ and left IJ central venous catheters stable in position. Diffuse small heterogenous opacities throughout both lungs perhaps marginally improved since previous exam. Heart size and mediastinal contours are within normal limits. Aortic Atherosclerosis (ICD10-170.0). Suspect small right pleural effusion. Visualized bones unremarkable. IMPRESSION: 1. Marginally improved diffuse bilateral opacities. 2. Small right pleural effusion. Electronically Signed   By: JONETTA Faes M.D.   On: 11/17/2023  10:32   DG CHEST PORT 1 VIEW Result Date: 11/16/2023 CLINICAL DATA:  Line placement EXAM: PORTABLE CHEST 1 VIEW COMPARISON:  X-ray 11/15/2023 FINDINGS: New right IJ catheter in place with tip along the central SVC near the right atrium. ET tube, enteric tube, left IJ catheter and left-sided PICC again noted. The PICC has once again the tip folded back on itself. Possibly along the azygous. This could be repositioned. Underinflation. Stable cardiopericardial silhouette with diffuse parenchymal and interstitial opacities. Small right effusion. No pneumothorax. Overlapping cardiac leads. IMPRESSION: New right IJ catheter in place.  No pneumothorax. Left-sided PICC has catheter tip folded at the tip and could be within the azygous. Recommend repositioning. Electronically Signed   By: Ranell Bring M.D.   On: 11/16/2023 12:13   ECHOCARDIOGRAM COMPLETE Result Date: 11/15/2023    ECHOCARDIOGRAM REPORT   Patient Name:   BERNADEAN SALING [facility] Date of Exam: 11/15/2023 Medical Rec #:  [id number]              Height:       64.0 in Accession #:    [id number]             Weight:       203.9 lb Date of Birth:  Feb 16, 1966               BSA:          1.973 m Patient Age:    57 years               BP:           106/64 mmHg Patient Gender: F                      HR:           125 bpm. Exam Location:  Inpatient Procedure: 2D Echo, Cardiac Doppler, Color Doppler and  Intracardiac            Opacification Agent (Both Spectral and Color Flow Doppler were            utilized during procedure). Indications:    CHF-Acute Diastolic I50.31  History:        Patient has prior history of Echocardiogram examinations, most                 recent 10/24/2023. CHF, CAD and Previous Myocardial Infarction;                 Risk Factors:Hypertension and Diabetes.  Sonographer:    Jayson Gaskins Referring Phys: [phone] LINDSAY NICOLE FINCH IMPRESSIONS  1. Extremely poor acoustic windows Definity  used. Severe hypokinesis of the septum, apex, inferior, distal anterior walls . Left ventricular ejection fraction, by estimation, is 25%. The left ventricle has severely decreased function. There is mild left  ventricular hypertrophy.  2. Right ventricular systolic function is normal. The right ventricular size is normal.  3. Mild to moderate mitral valve regurgitation. Moderate mitral annular calcification.  4. AV is thickened, calcified Peak and mean gradients through the valve are 28 and 18 mm Hg respectivel AVA (VTI) is 1 cm2. Dimensionless valve index is 0.43 consistent with mild AS.SABRA Aortic valve regurgitation is mild.  5. The inferior vena cava is normal in size with <50% respiratory variability, suggesting right atrial pressure of 8 mmHg. FINDINGS  Left Ventricle: Extremely poor acoustic windows Definity  used. Severe hypokinesis of the septum, apex, inferior, distal anterior walls. Left ventricular ejection fraction, by estimation, is 25%. The left  ventricle has severely decreased function. The left ventricular internal cavity size was normal in size. There is mild left ventricular hypertrophy. Right Ventricle: The right ventricular size is normal. Right vetricular wall thickness was not assessed. Right ventricular systolic function is normal. Left Atrium: Left atrial size was normal in size. Right Atrium: Right atrial size was normal in size. Pericardium: Trivial pericardial effusion is present. Mitral  Valve: There is mild thickening of the mitral valve leaflet(s). Moderate mitral annular calcification. Mild to moderate mitral valve regurgitation. Tricuspid Valve: The tricuspid valve is grossly normal. Tricuspid valve regurgitation is trivial. Aortic Valve: AV is thickened, calcified Peak and mean gradients through the valve are 28 and 18 mm Hg respectivel AVA (VTI) is 1 cm2. Dimensionless valve index is 0.43 consistent with mild AS. Aortic valve regurgitation is mild. Aortic valve mean gradient measures 18.0 mmHg. Aortic valve peak gradient measures 28.3 mmHg. Aortic valve area, by VTI measures 0.97 cm. Pulmonic Valve: The pulmonic valve was not well visualized. Pulmonic valve regurgitation is trivial. No evidence of pulmonic stenosis. Aorta: The aortic root is normal in size and structure. Venous: The inferior vena cava is normal in size with less than 50% respiratory variability, suggesting right atrial pressure of 8 mmHg. IAS/Shunts: No atrial level shunt detected by color flow Doppler.  LEFT VENTRICLE PLAX 2D LVIDd:         5.50 cm   Diastology LVIDs:         4.40 cm   LV e' medial:    6.20 cm/s LV PW:         1.20 cm   LV E/e' medial:  22.1 LV IVS:        1.10 cm   LV e' lateral:   6.96 cm/s LVOT diam:     1.70 cm   LV E/e' lateral: 19.7 LV SV:         40 LV SV Index:   20 LVOT Area:     2.27 cm  LEFT ATRIUM             Index LA Vol (A2C):   51.8 ml 26.26 ml/m LA Vol (A4C):   48.8 ml 24.74 ml/m LA Biplane Vol: 52.8 ml 26.77 ml/m  AORTIC VALVE AV Area (Vmax):    0.99 cm AV Area (Vmean):   0.89 cm AV Area (VTI):     0.97 cm AV Vmax:           266.00 cm/s AV Vmean:          206.000 cm/s AV VTI:            0.418 m AV Peak Grad:      28.3 mmHg AV Mean Grad:      18.0 mmHg LVOT Vmax:         116.00 cm/s LVOT Vmean:        80.700 cm/s LVOT VTI:          0.178 m LVOT/AV VTI ratio: 0.43  AORTA Ao Root diam: 2.50 cm MITRAL VALVE MV Area (PHT): 6.54 cm     SHUNTS MV Decel Time: 116 msec     Systemic VTI:  0.18  m MV E velocity: 137.00 cm/s  Systemic Diam: 1.70 cm MV A velocity: 126.00 cm/s MV E/A ratio:  1.09 Vina Gull MD Electronically signed by Vina Gull MD Signature Date/Time: 11/15/2023/7:55:06 PM    Final    DG Chest Port 1 View Result Date: 11/15/2023 CLINICAL DATA:  Endotracheal tube. Acute on  chronic diastolic congestive heart failure. EXAM: PORTABLE CHEST 1 VIEW COMPARISON:  November 14, 2023. FINDINGS: Endotracheal and nasogastric tubes are in grossly good position. Left internal jugular catheter and left-sided PICC line are unchanged. Mildly increased diffuse lung opacities are noted concerning for worsening edema or pneumonia with associated right pleural effusion. Bony thorax is unremarkable. IMPRESSION: Stable support apparatus.  Increased lung opacities as noted above. Electronically Signed   By: Lynwood Landy Raddle M.D.   On: 11/15/2023 08:31   DG CHEST PORT 1 VIEW Result Date: 11/14/2023 CLINICAL DATA:  Endotracheal tube placement. EXAM: PORTABLE CHEST 1 VIEW COMPARISON:  Same day. FINDINGS: Endotracheal tube in grossly good position. Nasogastric tube is seen entering stomach. Left internal jugular catheter is noted with distal tip in expected position of right atrium. Left-sided PICC line is noted with distal tip looped into SVC. Stable bilateral lung opacities are noted concerning for edema or pneumonia with associated effusions. IMPRESSION: Endotracheal tube in grossly good position. Left-sided venous catheters as noted above. Continued bilateral lung opacities as noted above. Electronically Signed   By: Lynwood Landy Raddle M.D.   On: 11/14/2023 17:48   DG Abd 1 View Result Date: 11/14/2023 CLINICAL DATA:  Endotracheal tube placement. EXAM: ABDOMEN - 1 VIEW COMPARISON:  Same day. FINDINGS: Endotracheal tube is in grossly good position. Nasogastric tube is seen entering stomach with distal tip in expected position of distal stomach. Left-sided PICC line is noted with distal tip in expected position of SVC.  Left internal jugular catheter is noted with distal tip in expected position of right atrium. Diffuse bilateral lung opacities are noted concerning for edema or pneumonia with small pleural effusions. IMPRESSION: Endotracheal tube is in grossly good position. Nasogastric tube tip seen in expected position of distal stomach. Left-sided PICC line and left internal jugular catheter as noted above. Electronically Signed   By: Lynwood Landy Raddle M.D.   On: 11/14/2023 17:47   ABORTED INVASIVE LAB PROCEDURE Result Date: 11/14/2023 Patient unable to get on the cath table due to severe dyspnea/orthopnea. Returned to unit for further evaluation and treatment.   DG CHEST PORT 1 VIEW Result Date: 11/14/2023 CLINICAL DATA:  Hypoxia EXAM: PORTABLE CHEST 1 VIEW COMPARISON:  11/11/2023 FINDINGS: Prominent and worsening bilateral diffuse airspace opacities in the lungs with obscuration of the cardiac margins and diaphragms. Atherosclerotic calcification of the aortic arch. Lower thoracic spondylosis. Degenerative AC joint arthropathy on the left. IMPRESSION: 1. Prominent and worsening bilateral diffuse airspace opacities in the lungs, with obscuration of the cardiac margins and diaphragms. This could reflect diffuse pulmonary edema or diffuse pneumonia. 2. Aortic Atherosclerosis (ICD10-I70.0). Electronically Signed   By: Ryan Salvage M.D.   On: 11/14/2023 13:07   DG Chest 2 View Result Date: 11/11/2023 CLINICAL DATA:  Shortness of breath. EXAM: CHEST - 2 VIEW COMPARISON:  November 07, 2023. FINDINGS: Stable cardiomediastinal silhouette. Left-sided PICC line is noted with distal tip looped within SVC. Increased bilateral patchy and interstitial airspace opacities are noted suggesting worsening pneumonia or pulmonary edema. Small pleural effusions are noted. Bony thorax is unremarkable. IMPRESSION: Significantly increased bilateral lung opacities are noted suggesting worsening pneumonia or edema with small pleural effusions.  Electronically Signed   By: Lynwood Landy Raddle M.D.   On: 11/11/2023 15:08   VAS US  LOWER EXTREMITY VENOUS (DVT) Result Date: 11/08/2023  Lower Venous DVT Study Patient Name:  Kayla Schmitt [facility]  Date of Exam:   11/08/2023 Medical Rec #: [id number]  Accession #:    [id number] Date of Birth: 02-19-1966                Patient Gender: F Patient Age:   66 years Exam Location:  [facility] Procedure:      VAS US  LOWER EXTREMITY VENOUS (DVT) Referring Phys: PAMELA LOVE --------------------------------------------------------------------------------  Indications: Edema.  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 Limitations: Poor ultrasound/tissue interface and Edema, acoustic shadowing secondary to plaque. Comparison Study: Prior negative right LEV done 11/03/23 Performing Technologist: Alberta Lis RVS  Examination Guidelines: A complete evaluation includes B-mode imaging, spectral Doppler, color Doppler, and power Doppler as needed of all accessible portions of each vessel. Bilateral testing is considered an integral part of a complete examination. Limited examinations for reoccurring indications may be performed as noted. The reflux portion of the exam is performed with the patient in reverse Trendelenburg.  +---------+---------------+---------+-----------+----------+-------------------+ RIGHT    CompressibilityPhasicitySpontaneityPropertiesThrombus Aging      +---------+---------------+---------+-----------+----------+-------------------+ CFV      Full           Yes      Yes                                      +---------+---------------+---------+-----------+----------+-------------------+ SFJ      Full           Yes      Yes                                      +---------+---------------+---------+-----------+----------+-------------------+ FV Prox  Full                                                              +---------+---------------+---------+-----------+----------+-------------------+ FV Mid   Full           Yes      Yes                                      +---------+---------------+---------+-----------+----------+-------------------+ FV DistalFull           Yes      Yes                                      +---------+---------------+---------+-----------+----------+-------------------+ PFV      Full                                                             +---------+---------------+---------+-----------+----------+-------------------+ POP      Full           Yes      Yes                                      +---------+---------------+---------+-----------+----------+-------------------+  PTV      Full                                                             +---------+---------------+---------+-----------+----------+-------------------+ PERO                                                  Not well visualized +---------+---------------+---------+-----------+----------+-------------------+ Gastroc  Full                                                             +---------+---------------+---------+-----------+----------+-------------------+ SSV      Full                                                             +---------+---------------+---------+-----------+----------+-------------------+     Summary: RIGHT: - Findings appear essentially unchanged compared to previous examination. - There is no evidence of deep vein thrombosis in the lower extremity. However, portions of this examination were limited- see technologist comments above.  - No cystic structure found in the popliteal fossa. - Ultrasound characteristics of enlarged lymph nodes are noted in the groin.  LEFT: - No evidence of common femoral vein obstruction.  - Ultrasound characteristics of enlarged lymph nodes noted in the groin.  *See table(s) above for measurements and observations.  Electronically signed by Gaile New MD on 11/08/2023 at 5:18:03 PM.    Final    DG Chest 2 View Result Date: 11/07/2023 CLINICAL DATA:  857905 Dyspnea on exertion 142094 EXAM: CHEST - 2 VIEW COMPARISON:  11/05/2023. FINDINGS: Redemonstration of diffuse alveolar and interstitial opacities throughout bilateral lungs, overall slightly improved since the prior study. Findings are nonspecific and differential diagnosis includes pulmonary edema, ARDS or multilobar pneumonia. There is subtle blunting of bilateral posterior costophrenic angles, favoring bilateral trace pleural effusions. No pneumothorax. Stable cardio-mediastinal silhouette. No acute osseous abnormalities. The soft tissues are within normal limits. IMPRESSION: *Redemonstration of diffuse alveolar and interstitial opacities throughout bilateral lungs, overall slightly improved since the prior study. Findings are nonspecific and differential diagnosis includes pulmonary edema, ARDS or multilobar pneumonia. Electronically Signed   By: Ree Molt M.D.   On: 11/07/2023 17:28   DG Chest 2 View Result Date: 11/05/2023 CLINICAL DATA:  Shortness of breath EXAM: CHEST - 2 VIEW COMPARISON:  11/02/2023 FINDINGS: Left PICC line is in place. The catheter now courses into the right innominate vein with the tip likely in the right internal jugular vein. Cardiomegaly, vascular congestion. Diffuse bilateral airspace disease, worsening since prior study. No effusions or pneumothorax. No acute bony abnormality. IMPRESSION: Left PICC line now courses into the right innominate vein with the tip in the right internal jugular vein. Worsening diffuse bilateral airspace disease, likely edema although infection not excluded. Electronically Signed   By: Franky Crease  M.D.   On: 11/05/2023 20:30   VAS US  LOWER EXTREMITY VENOUS (DVT) Result Date: 11/03/2023  Lower Venous DVT Study Patient Name:  VANILLA HEATHERINGTON [facility]  Date of Exam:   11/03/2023 Medical Rec #: [id number]                Accession #:    [id number] Date of Birth: 08/15/1965                Patient Gender: F Patient Age:   38 years Exam Location:  [facility] Procedure:      VAS US  LOWER EXTREMITY VENOUS (DVT) Referring Phys: SVEN ELKS --------------------------------------------------------------------------------  Indications: Swelling, and Edema.  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 Limitations: Poor ultrasound/tissue interface and body habitus. Comparison Study: No priors. Performing Technologist: Ricka Sturdivant-Jones RDMS, RVT  Examination Guidelines: A complete evaluation includes B-mode imaging, spectral Doppler, color Doppler, and power Doppler as needed of all accessible portions of each vessel. Bilateral testing is considered an integral part of a complete examination. Limited examinations for reoccurring indications may be performed as noted. The reflux portion of the exam is performed with the patient in reverse Trendelenburg.  +---------+---------------+---------+-----------+----------+--------------+ RIGHT    CompressibilityPhasicitySpontaneityPropertiesThrombus Aging +---------+---------------+---------+-----------+----------+--------------+ CFV      Full           Yes      Yes                                 +---------+---------------+---------+-----------+----------+--------------+ SFJ      Full                                                        +---------+---------------+---------+-----------+----------+--------------+ FV Prox  Full                                                        +---------+---------------+---------+-----------+----------+--------------+ FV Mid   Full           Yes      Yes                                 +---------+---------------+---------+-----------+----------+--------------+ FV DistalFull                                                         +---------+---------------+---------+-----------+----------+--------------+ PFV      Full                                                        +---------+---------------+---------+-----------+----------+--------------+ POP      Full           Yes      Yes                                 +---------+---------------+---------+-----------+----------+--------------+  PTV      Full                                         appears patent +---------+---------------+---------+-----------+----------+--------------+ PERO     Full                                         appears patent +---------+---------------+---------+-----------+----------+--------------+ Calf veins were poorly visualized in 2D imaging. Appears patent with color imaging.  +----+---------------+---------+-----------+----------+--------------+ LEFTCompressibilityPhasicitySpontaneityPropertiesThrombus Aging +----+---------------+---------+-----------+----------+--------------+ CFV Full           Yes      Yes                                 +----+---------------+---------+-----------+----------+--------------+ SFJ Full                                                        +----+---------------+---------+-----------+----------+--------------+     Summary: RIGHT: - There is no evidence of deep vein thrombosis in the lower extremity.  - No cystic structure found in the popliteal fossa. - Ultrasound characteristics of enlarged lymph nodes are noted in the groin.  LEFT: - No evidence of common femoral vein obstruction.   *See table(s) above for measurements and observations. Electronically signed by Lonni Gaskins MD on 11/03/2023 at 12:10:37 PM.    Final    DG CHEST PORT 1 VIEW Result Date: 11/02/2023 CLINICAL DATA:  141880 SOB (shortness of breath) 141880 EXAM: PORTABLE CHEST 1 VIEW COMPARISON:  Chest x-ray 04/03/2023 FINDINGS: Interval placement of left PICC with tip overlying the superior cavoatrial junction.  Prominent cardiac silhouette likely due to AP portable technique. The heart and mediastinal contours are unchanged. Diffuse patchy airspace and interstitial opacities. No pleural effusion. No pneumothorax. No acute osseous abnormality. IMPRESSION: 1. Diffuse patchy airspace and interstitial opacities. Finding could represent pulmonary edema versus infection. 2.  Aortic Atherosclerosis (ICD10-I70.0). Electronically Signed   By: Morgane  Naveau M.D.   On: 11/02/2023 21:30    Microbiology: Results for orders placed or performed during the hospital encounter of 11/12/23  MRSA Next Gen by PCR, Nasal     Status: None   Collection Time: 11/14/23  6:11 PM   Specimen: Nasal Mucosa; Nasal Swab  Result Value Ref Range Status   MRSA by PCR Next Gen NOT DETECTED NOT DETECTED Final    Comment: (NOTE) The GeneXpert MRSA Assay (FDA approved for NASAL specimens only), is one component of a comprehensive MRSA colonization surveillance program. It is not intended to diagnose MRSA infection nor to guide or monitor treatment for MRSA infections. Test performance is not FDA approved in patients less than 74 years old. Performed at [facility], [address], [place], [place] [address]   Culture, Respiratory w Gram Stain     Status: None   Collection Time: 11/15/23  3:25 PM   Specimen: Tracheal Aspirate; Respiratory  Result Value Ref Range Status   Specimen Description TRACHEAL ASPIRATE  Final   Special Requests NONE  Final   Gram Stain   Final    ABUNDANT  WBC PRESENT,BOTH PMN AND MONONUCLEAR NO ORGANISMS SEEN    Culture   Final    RARE Normal respiratory flora-no Staph aureus or Pseudomonas seen Performed at [facility], [address], [place], [place] [address]    Report Status 11/17/2023 FINAL  Final    Labs: CBC: Recent Labs  Lab 11/21/23 0535 11/22/23 0512 11/23/23 0510 11/25/23 0209 11/27/23 0235 11/27/23 1122  WBC 11.8* 9.7 10.0 9.6 16.6*  --   NEUTROABS 7.1 5.7  --    --   --   --   HGB 8.4* 8.1* 7.7* 8.1* 7.3* 7.4*  HCT 26.4* 25.2* 24.2* 25.6* 23.4* 23.7*  MCV 89.5 90.0 90.6 90.8 91.4  --   PLT 301 236 250 222 218  --    Basic Metabolic Panel: Recent Labs  Lab 11/23/23 0510 11/24/23 0841 11/25/23 0209 11/26/23 0256 11/27/23 0235  NA 133* 132* 134* 135 134*  K 3.1* 3.8 4.0 3.5 3.6  CL 97* 96* 97* 96* 96*  CO2 25 22 25 25 26   GLUCOSE 120* 201* 154* 148* 166*  BUN 71* 79* 91* 93* 93*  CREATININE 2.88* 2.75* 2.31* 2.10* 2.06*  CALCIUM  8.4* 8.8* 8.7* 8.8* 9.0  MG 2.1 1.9 1.6* 2.5* 2.0  PHOS 4.7* 4.2 3.4 3.1 2.6   Liver Function Tests: Recent Labs  Lab 11/23/23 0510 11/24/23 0841 11/25/23 0209 11/26/23 0256 11/27/23 0235  ALBUMIN  2.6* 2.6* 2.6* 2.7* 2.8*   CBG: Recent Labs  Lab 11/26/23 1146 11/26/23 1550 11/26/23 2056 11/27/23 0601 11/27/23 1126  GLUCAP 165* 167* 227* 156* 192*    Discharge time spent: greater than 30 minutes.  Signed: Elidia Toribio Furnace, MD [facility] 11/27/2023

## 2023-11-27 NOTE — Progress Notes (Signed)
 Occupational Therapy Treatment Patient Details Name: Kayla Schmitt MRN: [id number] DOB: 16-Jul-1965 Today's Date: 11/27/2023   History of present illness 58 y.o female admitted 7/15 from [facility] for SOB, CHF exacerbation. 7/17 attempted RHC but couldn't lie flat with pulmonary edema and decompensation requiring intubation. 7/19 CRRT. 7/20 extubation. PMHx: admission 6/22-7/1 for LLE pain, sepsis, necrotizing fasciitis. 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 CAD, aortic stenosis, HTN, CHF, PAD, bil transmet amputation, HLD, hypothyroidsim, T2DM.   OT comments  Pt progressing toward established OT goals. Return to assist pt out of wheelchair into recliner. Pt needing min cues/set-up for transfer, and then able to transfer without slide board at [facility] level. Educated pt and mother on ideal set up as well as direction.       If plan is discharge home, recommend the following:  A lot of help with walking and/or transfers;A lot of help with bathing/dressing/bathroom   Equipment Recommendations  None recommended by OT    Recommendations for Other Services      Precautions / Restrictions Precautions Precautions: Fall;Other (comment) Recall of Precautions/Restrictions: Impaired Precaution/Restrictions Comments: Contact precs; R transmet amputation, L BKA Required Braces or Orthoses: Other Brace Other Brace: Limb guard (benefits from encouragement to don/maintain) Restrictions Weight Bearing Restrictions Per Provider Order: Yes LLE Weight Bearing Per Provider Order: Non weight bearing Other Position/Activity Restrictions: Date of surgery 6/24       Mobility Bed Mobility                    Transfers Overall transfer level: Needs assistance Equipment used: None Transfers: Bed to chair/wheelchair/BSC            Lateral/Scoot Transfers: Contact guard assist, Min assist, +2 safety/equipment, With slide board General transfer comment: set-up for wheelchair placment then CGA for safety with scoot to  chair     Balance Overall balance assessment: Needs assistance Sitting-balance support: No upper extremity supported, Feet unsupported Sitting balance-Leahy Scale: Good                                     ADL either performed or assessed with clinical judgement   ADL Overall ADL's : Needs assistance/impaired                         Toilet Transfer: Set up;Contact guard assist (scoot)           Functional mobility during ADLs: Contact guard assist      Extremity/Trunk Assessment Upper Extremity Assessment Upper Extremity Assessment: Generalized weakness   Lower Extremity Assessment Lower Extremity Assessment: Defer to PT evaluation        Vision       Perception     Praxis     Communication Communication Communication: No apparent difficulties   Cognition Arousal: Alert Behavior During Therapy: WFL for tasks assessed/performed, Flat affect Cognition: No apparent impairments             OT - Cognition Comments: STM deficits present; unsure if baseline                 Following commands: Impaired Following commands impaired: Only follows one step commands consistently, Follows multi-step commands inconsistently      Cueing   Cueing Techniques: Verbal cues, Gestural cues  Exercises Exercises: Other exercises Other Exercises Other Exercises: education provided regarding weight shifting/pressure relief, LB ADl, safety with transfers  Shoulder Instructions       General Comments      Pertinent Vitals/ Pain       Pain Assessment Pain Assessment: No/denies pain Faces Pain Scale: Hurts a little bit Pain Location: generalized Pain Descriptors / Indicators: Grimacing Pain Intervention(s): Limited activity within patient's tolerance  Home Living                                          Prior Functioning/Environment              Frequency           Progress Toward Goals  OT  Goals(current goals can now be found in the care plan section)  Progress towards OT goals: Progressing toward goals  Acute Rehab OT Goals Patient Stated Goal: get better OT Goal Formulation: With patient Time For Goal Achievement: 12/10/23 Potential to Achieve Goals: Good ADL Goals Pt Will Perform Lower Body Dressing: with supervision;sit to/from stand;sitting/lateral leans Pt Will Transfer to Toilet: with supervision;bedside commode;with transfer board;squat pivot transfer;stand pivot transfer Pt Will Perform Toileting - Clothing Manipulation and hygiene: with supervision;sitting/lateral leans  Plan      Co-evaluation                 AM-PAC OT 6 Clicks Daily Activity     Outcome Measure   Help from another person eating meals?: None Help from another person taking care of personal grooming?: A Little Help from another person toileting, which includes using toliet, bedpan, or urinal?: A Lot Help from another person bathing (including washing, rinsing, drying)?: A Lot Help from another person to put on and taking off regular upper body clothing?: A Little Help from another person to put on and taking off regular lower body clothing?: A Little 6 Click Score: 17    End of Session Equipment Utilized During Treatment: Oxygen ;Other (comment) (WC)  OT Visit Diagnosis: Unsteadiness on feet (R26.81);Other abnormalities of gait and mobility (R26.89);Muscle weakness (generalized) (M62.81)   Activity Tolerance Patient tolerated treatment well   Patient Left in chair;with call bell/phone within reach;with chair alarm set;with family/visitor present   Nurse Communication Mobility status;Need for lift equipment        Time: 8354-8342 OT Time Calculation (min): 12 min  Charges: OT General Charges $OT Visit: 1 Visit OT Treatments $Therapeutic Activity: 8-22 mins  [name], OTR/L [facility] Acute Rehabilitation Office: [phone]   Elma JONETTA Lebron 11/27/2023, 5:07  PM

## 2023-11-27 NOTE — Plan of Care (Signed)
  Problem: Clinical Measurements: Goal: Ability to maintain clinical measurements within normal limits will improve Outcome: Progressing Goal: Will remain free from infection Outcome: Progressing Goal: Diagnostic test results will improve Outcome: Progressing Goal: Respiratory complications will improve Outcome: Progressing Goal: Cardiovascular complication will be avoided Outcome: Progressing   Problem: Activity: Goal: Risk for activity intolerance will decrease Outcome: Progressing   Problem: Nutrition: Goal: Adequate nutrition will be maintained Outcome: Progressing   Problem: Coping: Goal: Level of anxiety will decrease Outcome: Progressing   Problem: Elimination: Goal: Will not experience complications related to bowel motility Outcome: Progressing Goal: Will not experience complications related to urinary retention Outcome: Progressing   Problem: Pain Managment: Goal: General experience of comfort will improve and/or be controlled Outcome: Progressing   Problem: Safety: Goal: Ability to remain free from injury will improve Outcome: Progressing   Problem: Skin Integrity: Goal: Risk for impaired skin integrity will decrease Outcome: Progressing   Problem: Coping: Goal: Ability to adjust to condition or change in health will improve Outcome: Progressing   Problem: Fluid Volume: Goal: Ability to maintain a balanced intake and output will improve Outcome: Progressing   Problem: Metabolic: Goal: Ability to maintain appropriate glucose levels will improve Outcome: Progressing   Problem: Nutritional: Goal: Maintenance of adequate nutrition will improve Outcome: Progressing Goal: Progress toward achieving an optimal weight will improve Outcome: Progressing   Problem: Skin Integrity: Goal: Risk for impaired skin integrity will decrease Outcome: Progressing   Problem: Tissue Perfusion: Goal: Adequacy of tissue perfusion will improve Outcome: Progressing    Problem: Activity: Goal: Ability to return to baseline activity level will improve Outcome: Progressing   Problem: Cardiovascular: Goal: Ability to achieve and maintain adequate cardiovascular perfusion will improve Outcome: Progressing

## 2023-11-27 NOTE — Progress Notes (Signed)
 Physical Therapy Treatment Patient Details Name: Kayla Schmitt MRN: [id number] DOB: 25-Jan-1966 Today's Date: 11/27/2023   History of Present Illness 58 y.o female admitted 7/15 from [facility] for SOB, CHF exacerbation. 7/17 attempted RHC but couldn't lie flat with pulmonary edema and decompensation requiring intubation. 7/19 CRRT. 7/20 extubation. PMHx: admission 6/22-7/1 for LLE pain, sepsis, necrotizing fasciitis. 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 CAD, aortic stenosis, HTN, CHF, PAD, bil transmet amputation, HLD, hypothyroidsim, T2DM.    PT Comments  Pt received sitting EOB, pt reporting eager to get OOB to chair, and agreeable to participate in PT session with emphasis on transfer training from bed to drop arm WC which she reports she hasn't done recently. Pt instructed on slide board transfer and mother present also for education. Emphasis on activity pacing, pursed-lip breathing and also assessment of SpO2 with exertion. Pt hypoxic on RA with exertion after lateral scoot transfer via slide board to [facility] due to DOE 3/4 and SpO2 87% on RA, improved to >92% on 2L/min. Pt given up to minA for lateral scoot to chair due to c/o fatigue and for ease of slide along board, pt reports she is typically more comfortable with scoot/squat pivot without board but has a board so benefits from practice via this method to gain confidence with it. Pt continues to benefit from PT services to progress toward functional mobility goals, pt would benefit from short term post-acute rehab but per chart review she plans home with HHPT, recommend max HH therapies and support as able.    If plan is discharge home, recommend the following: A lot of help with walking and/or transfers;A lot of help with bathing/dressing/bathroom;Assistance with cooking/housework;Assist for transportation;Help with stairs or ramp for entrance   Can travel by private vehicle        Equipment Recommendations  None recommended by PT    Recommendations for Other  Services       Precautions / Restrictions Precautions Precautions: Fall;Other (comment) Recall of Precautions/Restrictions: Impaired Precaution/Restrictions Comments: Contact precs; R transmet amputation, L BKA Required Braces or Orthoses: Other Brace Other Brace: Limb guard (pt needs encouragement to don/maintain) Restrictions Weight Bearing Restrictions Per Provider Order: Yes LLE Weight Bearing Per Provider Order: Non weight bearing Other Position/Activity Restrictions: Date of surgery 6/24     Mobility  Bed Mobility Overal bed mobility: Needs Assistance Bed Mobility: Supine to Sit           General bed mobility comments: pt received sitting EOB    Transfers Overall transfer level: Needs assistance Equipment used: Sliding board Transfers: Bed to chair/wheelchair/BSC            Lateral/Scoot Transfers: Contact guard assist, Min assist, +2 safety/equipment, With slide board General transfer comment: Pt requesting her Mother to stand by and brace wheelchair for safety, initial visual/verbal cues for technique via slide board and safe UE placement, needs some repetition of cues for UE placement/body mechanics while scooting, light minA to CGA for bed pad assist and to avoid her curling her fingers under slide board.    Ambulation/Gait                   Psychologist, counselling mobility:  (pt defers once up in wheelchair due to fatigue)   Tilt Bed    Modified Rankin (Stroke Patients Only)       Balance Overall balance assessment: Needs assistance Sitting-balance support: No upper extremity supported,  Feet unsupported Sitting balance-Leahy Scale: Good         Standing balance comment: scoot transfer OOB only today                            Communication Communication Communication: No apparent difficulties  Cognition Arousal: Alert Behavior During Therapy: WFL for tasks  assessed/performed, Flat affect   PT - Cognitive impairments: No apparent impairments                       PT - Cognition Comments: Motivated to progress OOB to chair initially, but during transfers pt appears to fatigue then states she doesn't want to overdo it and she is worried because last time she got in wheelchair, she had medical event after going home. PTA provided emotional support/listening. Needs some repetition of verbal/visual cues for safer body mechanics during unfamiliar techniques (slide board transfers). Following commands: Impaired Following commands impaired: Only follows one step commands consistently, Follows multi-step commands inconsistently    Cueing Cueing Techniques: Verbal cues, Gestural cues  Exercises      General Comments General comments (skin integrity, edema, etc.): BP WFL once in chair, pt needed 2L/min to perform exertional tasks, hypoxic on RA (per patient she did not use O2 at home previously)      Pertinent Vitals/Pain Pain Assessment Pain Assessment: Faces Faces Pain Scale: Hurts a little bit Pain Location: generalized Pain Descriptors / Indicators: Grimacing Pain Intervention(s): Limited activity within patient's tolerance, Monitored during session, Repositioned    Home Living                          Prior Function            PT Goals (current goals can now be found in the care plan section) Acute Rehab PT Goals Patient Stated Goal: To go home but not wear myself out too much PT Goal Formulation: Patient unable to participate in goal setting Time For Goal Achievement: 11/30/23 Progress towards PT goals: Progressing toward goals    Frequency    Min 2X/week      PT Plan      Co-evaluation              AM-PAC PT 6 Clicks Mobility   Outcome Measure  Help needed turning from your back to your side while in a flat bed without using bedrails?: None Help needed moving from lying on your back to  sitting on the side of a flat bed without using bedrails?: A Little Help needed moving to and from a bed to a chair (including a wheelchair)?: A Little Help needed standing up from a chair using your arms (e.g., wheelchair or bedside chair)?: A Lot Help needed to walk in hospital room?: Total Help needed climbing 3-5 steps with a railing? : Total 6 Click Score: 14    End of Session Equipment Utilized During Treatment: Oxygen ;Other (comment) (LLE limb guard) Activity Tolerance: Patient tolerated treatment well;Patient limited by fatigue Patient left: in chair;with call bell/phone within reach;with family/visitor present (in locked wheelchair, pt mother present, RN aware, lab team arriving) Nurse Communication: Mobility status;Precautions;Other (comment) (pt wanting to sit up in wheelchair for an 45 mins to an hour) PT Visit Diagnosis: Unsteadiness on feet (R26.81);Other abnormalities of gait and mobility (R26.89);Muscle weakness (generalized) (M62.81);Difficulty in walking, not elsewhere classified (R26.2)     Time: 8947-8883 PT Time Calculation (min) (  ACUTE ONLY): 24 min  Charges:    $Therapeutic Activity: 23-37 mins PT General Charges $$ ACUTE PT VISIT: 1 Visit                     Zaire Levesque P., PTA Acute Rehabilitation Services Secure Chat Preferred 9a-5:30pm Office: [phone]    Connell [facility] 11/27/2023, 12:22 PM

## 2023-11-28 ENCOUNTER — Inpatient Hospital Stay (HOSPITAL_COMMUNITY)

## 2023-11-28 DIAGNOSIS — I061 Rheumatic aortic insufficiency: Secondary | ICD-10-CM

## 2023-11-28 DIAGNOSIS — I051 Rheumatic mitral insufficiency: Secondary | ICD-10-CM

## 2023-11-28 LAB — CBC WITH DIFFERENTIAL/PLATELET
Basophils Absolute: 0 K/uL (ref 0.0–0.1)
Basophils Relative: 0 %
Eosinophils Absolute: 1.2 K/uL — ABNORMAL HIGH (ref 0.0–0.5)
Eosinophils Relative: 4 %
HCT: 25.1 % — ABNORMAL LOW (ref 36.0–46.0)
Hemoglobin: 7.9 g/dL — ABNORMAL LOW (ref 12.0–15.0)
Lymphocytes Relative: 8 %
Lymphs Abs: 2.3 K/uL (ref 0.7–4.0)
MCH: 28.5 pg (ref 26.0–34.0)
MCHC: 31.5 g/dL (ref 30.0–36.0)
MCV: 90.6 fL (ref 80.0–100.0)
Monocytes Absolute: 2 K/uL — ABNORMAL HIGH (ref 0.1–1.0)
Monocytes Relative: 7 %
Neutro Abs: 23.7 K/uL — ABNORMAL HIGH (ref 1.7–7.7)
Neutrophils Relative %: 81 %
Platelets: 361 K/uL (ref 150–400)
RBC: 2.77 MIL/uL — ABNORMAL LOW (ref 3.87–5.11)
RDW: 19.6 % — ABNORMAL HIGH (ref 11.5–15.5)
WBC: 29.2 K/uL — ABNORMAL HIGH (ref 4.0–10.5)
nRBC: 0.6 % — ABNORMAL HIGH (ref 0.0–0.2)

## 2023-11-28 LAB — BASIC METABOLIC PANEL WITH GFR
Anion gap: 14 (ref 5–15)
BUN: 92 mg/dL — ABNORMAL HIGH (ref 6–20)
CO2: 26 mmol/L (ref 22–32)
Calcium: 9.2 mg/dL (ref 8.9–10.3)
Chloride: 96 mmol/L — ABNORMAL LOW (ref 98–111)
Creatinine, Ser: 1.99 mg/dL — ABNORMAL HIGH (ref 0.44–1.00)
GFR, Estimated: 29 mL/min — ABNORMAL LOW (ref >60)
Glucose, Bld: 153 mg/dL — ABNORMAL HIGH (ref 70–99)
Potassium: 3.5 mmol/L (ref 3.5–5.1)
Sodium: 136 mmol/L (ref 135–145)

## 2023-11-28 LAB — CBC
HCT: 22.5 % — ABNORMAL LOW (ref 36.0–46.0)
Hemoglobin: 7.2 g/dL — ABNORMAL LOW (ref 12.0–15.0)
MCH: 29 pg (ref 26.0–34.0)
MCHC: 32 g/dL (ref 30.0–36.0)
MCV: 90.7 fL (ref 80.0–100.0)
Platelets: 243 K/uL (ref 150–400)
RBC: 2.48 MIL/uL — ABNORMAL LOW (ref 3.87–5.11)
RDW: 19 % — ABNORMAL HIGH (ref 11.5–15.5)
WBC: 18.2 K/uL — ABNORMAL HIGH (ref 4.0–10.5)
nRBC: 0.5 % — ABNORMAL HIGH (ref 0.0–0.2)

## 2023-11-28 LAB — GLUCOSE, CAPILLARY
Glucose-Capillary: 153 mg/dL — ABNORMAL HIGH (ref 70–99)
Glucose-Capillary: 178 mg/dL — ABNORMAL HIGH (ref 70–99)
Glucose-Capillary: 169 mg/dL — ABNORMAL HIGH (ref 70–99)
Glucose-Capillary: 205 mg/dL — ABNORMAL HIGH (ref 70–99)
Glucose-Capillary: 207 mg/dL — ABNORMAL HIGH (ref 70–99)

## 2023-11-28 LAB — PREPARE RBC (CROSSMATCH)

## 2023-11-28 LAB — LACTIC ACID, PLASMA: Lactic Acid, Venous: 0.9 mmol/L (ref 0.5–1.9)

## 2023-11-28 LAB — TYPE AND SCREEN
ABO/RH(D): O POS
Antibody Screen: NEGATIVE

## 2023-11-28 MED ORDER — MIDODRINE HCL 5 MG PO TABS
5.0000 mg | ORAL_TABLET | Freq: Three times a day (TID) | ORAL | Status: DC
  Administered 2023-11-28: 5 mg via ORAL
  Filled 2023-11-28: qty 1

## 2023-11-28 MED ORDER — FUROSEMIDE 10 MG/ML IJ SOLN: 40.0000 mg | Freq: Once | INTRAMUSCULAR | Status: DC

## 2023-11-28 MED ORDER — POTASSIUM CHLORIDE CRYS ER 20 MEQ PO TBCR
40.0000 meq | EXTENDED_RELEASE_TABLET | Freq: Once | ORAL | Status: AC
  Administered 2023-11-28: 40 meq via ORAL
  Filled 2023-11-28: qty 2

## 2023-11-28 MED ORDER — SODIUM CHLORIDE 0.9 % IR SOLN
3000.0000 mL | Status: DC
  Administered 2023-11-28: 3000 mL

## 2023-11-28 MED ORDER — MIDODRINE HCL 5 MG PO TABS
10.0000 mg | ORAL_TABLET | Freq: Three times a day (TID) | ORAL | Status: DC
  Administered 2023-11-28: 10 mg via ORAL
  Filled 2023-11-28: qty 2

## 2023-11-28 MED ORDER — SODIUM CHLORIDE 0.9% IV SOLUTION: Freq: Once | INTRAVENOUS | Status: DC

## 2023-11-28 MED ORDER — HYDROCORTISONE 1 % EX CREA
TOPICAL_CREAM | Freq: Three times a day (TID) | CUTANEOUS | Status: DC
  Administered 2023-11-29 – 2023-12-02 (×6): 1 via TOPICAL
  Filled 2023-11-28: qty 28

## 2023-11-28 MED ORDER — MIDODRINE HCL 5 MG PO TABS
10.0000 mg | ORAL_TABLET | Freq: Three times a day (TID) | ORAL | Status: DC
  Administered 2023-11-29: 10 mg via ORAL
  Filled 2023-11-28: qty 2

## 2023-11-28 MED ORDER — BUMETANIDE 0.25 MG/ML IJ SOLN
1.0000 mg | Freq: Once | INTRAMUSCULAR | Status: AC
  Administered 2023-11-28: 1 mg via INTRAVENOUS
  Filled 2023-11-28: qty 4

## 2023-11-28 MED ORDER — SODIUM CHLORIDE 0.9 % IV SOLN
1.0000 g | INTRAVENOUS | Status: DC
  Administered 2023-11-28: 1 g via INTRAVENOUS
  Filled 2023-11-28: qty 10

## 2023-11-28 MED ORDER — FERROUS SULFATE 325 (65 FE) MG PO TABS
325.0000 mg | ORAL_TABLET | Freq: Every day | ORAL | Status: DC
  Administered 2023-11-29 – 2023-12-06 (×5): 325 mg via ORAL
  Filled 2023-11-28 (×7): qty 1

## 2023-11-28 MED ORDER — MIDODRINE HCL 5 MG PO TABS
5.0000 mg | ORAL_TABLET | Freq: Once | ORAL | Status: DC
  Filled 2023-11-28: qty 1

## 2023-11-28 NOTE — Progress Notes (Addendum)
 PROGRESS NOTE    Kayla Schmitt  FMW:[id number] DOB: 04/17/1966 DOA: 11/12/2023 PCP: [name], NP   Brief Narrative:   58 yo female with the past medical history of aortic stenosis, coronary artery disease, mitral regurgitation, heart failure, peripheral vascular disease and hypertension who presented with dyspnea.  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 She was discharged to CIR. On 07/15 she was found volume overloaded, worsening peripheral edema, positive dyspnea and 02 saturation in the 80's.  Assessment & Plan:  Principal Problem:   Acute on chronic systolic CHF (congestive heart failure) (HCC) Active Problems:   CAD (coronary artery disease)   Hypertension   Chronic kidney disease, stage 3a (HCC)   Type 2 diabetes mellitus with hyperlipidemia (HCC)   Hypothyroidism   S/P BKA (below knee amputation) unilateral, left (HCC)   GERD (gastroesophageal reflux disease)   Iron  deficiency anemia    Leukocytosis: Staying at 18, likely UTI.Switched to IV Rocephin  from oral cephalexin  on 7/31. Afebrile. f/u CBC in am.  Acute blood loss anemia: Likely in the setting of hematuria Ordered one unit of PRBC on 7/31. F/u H&H closely  Hematuria: Unclear etiology, possibly foley trauma. Will start CBI. May need Urology consult  Acute on chronic systolic CHF (congestive heart failure)  07/18 echocardiogram with reduced LV systolic function to 25%, mild to moderate MR, mild AS.  07/23 limited echocardiogram with reduced LV systolic function with EF 25 to 30%, global hypokinesis, mild to moderate MR, moderate to severe aortic stenosis,    07/17 unable to do cardiac catheterization due to pulmonary edema.  Patient required vasopressors and inotropic support with norepinephrine  and dobutamine , off 07/24.  Continue with torsemide  80 mg bid. Will give additional dose of 40 mg IV lasix  today. Holding on  afterload reduction due to risk of hypotension.    Acute hypoxemia and hypercapnic respiratory failure due to acute cardiogenic pulmonary edema and aspiration pneumonia.  Liberated from invasive mechanical ventilation on 07/20.  Completed 7 days of antibiotic therapy. 02 saturation 98% on room air.    CAD (coronary artery disease) No chest pain, no acute coronary syndrome. Held aspirin  in the setting of acute blood loss anemia    Hypertension Holding antihypertensive medications due to risk of hypotension.    Chronic kidney disease, stage 3a   Patient required renal replacement therapy 07/22 discontinued CRRT.  07/24 urinary retention (650 ml) placed foley.  07/26 Foley removed  07/27 tolerating well off foley.    Continue loop diuretic therapy with torsemide  Follow up renal function Nephrology on board   Type 2 diabetes mellitus with hyperlipidemia Hyperglycemia  Continue glucose cover and monitoring with insulin  sliding scale.  Basal insulin   Continue statin therapy.   S/P BKA (below knee amputation) unilateral, left   Recent MRSA bacteremia, completed daptomycin  and 7 days of linezolid  with Zosyn , on 07/24    Hypothyroidism Continue levothyroxine     GERD (gastroesophageal reflux disease) Continue with ppi    Disposition: Home  DVT prophylaxis: Dced heparin  on 7/31 because of hematuria     Code Status: Prior Family Communication:  Mother at the bedside Status is: Inpatient Remains inpatient appropriate because: Leukocytosis, UTI    Subjective:  She wanted to go home. We discussed about her leukocytosis, possible UTI and further management plan. Mother was also present at the bedside.  Examination:  General exam: Appears calm and comfortable  Respiratory system: Clear to auscultation. Respiratory effort  normal. Cardiovascular system: S1 & S2 heard, RRR. No JVD, murmurs, rubs, gallops or clicks. No pedal edema. Gastrointestinal system: Abdomen is  nondistended, soft and nontender. No organomegaly or masses felt. Normal bowel sounds heard. Central nervous system: Alert and oriented. No focal neurological deficits. Extremities: s/p left BKA, trace pitting edema of right LE Skin: No rashes, lesions or ulcers Psychiatry: Judgement and insight appear normal. Mood & affect appropriate.      Diet Orders (From admission, onward)     Start     Ordered   11/21/23 0837  Diet Carb Modified Fluid consistency: Thin; Room service appropriate? Yes with Assist; Fluid restriction: 1500 mL Fluid  Diet effective now       Question Answer Comment  Calorie Level Medium 1600-2000   Fluid consistency: Thin   Room service appropriate? Yes with Assist   Fluid restriction: 1500 mL Fluid      11/21/23 0836            Objective: Vitals:   11/27/23 1954 11/27/23 2323 11/28/23 0313 11/28/23 0739  BP: 103/62 91/66 (!) 85/58   Pulse: 97 87 91   Resp: (!) 23 16 20    Temp: 99.6 F (37.6 C) 97.8 F (36.6 C) 98.4 F (36.9 C) 98.3 F (36.8 C)  TempSrc: Oral Oral Oral Oral  SpO2: 98% 93% 93%   Weight:      Height:        Intake/Output Summary (Last 24 hours) at 11/28/2023 1032 Last data filed at 11/28/2023 0317 Gross per 24 hour  Intake 240 ml  Output 600 ml  Net -360 ml   Filed Weights   11/25/23 0449 11/26/23 0500 11/27/23 0437  Weight: 85.1 kg 86.7 kg 84.4 kg    Scheduled Meds:  aspirin   81 mg Oral Daily   atorvastatin   40 mg Oral Daily   bethanechol   10 mg Oral TID   Chlorhexidine  Gluconate Cloth  6 each Topical Daily   docusate sodium   100 mg Oral BID   feeding supplement  237 mL Oral Q1400   gabapentin   300 mg Oral QHS   heparin  injection (subcutaneous)  5,000 Units Subcutaneous Q12H   hydrocortisone  cream   Topical TID   insulin  aspart  0-15 Units Subcutaneous TID WC   insulin  glargine-yfgn  6 Units Subcutaneous BID   levothyroxine   150 mcg Oral Q0600   multivitamin with minerals  1 tablet Oral Daily   nutrition supplement  (JUVEN)  1 packet Oral BID AC & HS   oxidized cellulose  1 each Topical Once   pantoprazole   40 mg Oral Daily   sodium chloride  flush  10-40 mL Intracatheter Q12H   torsemide   80 mg Oral BID   Continuous Infusions:  cefTRIAXone  (ROCEPHIN )  IV 1 g (11/28/23 0856)    Nutritional status Signs/Symptoms: NPO status Interventions: Refer to RD note for recommendations Body mass index is 31.94 kg/m.  Data Reviewed:   CBC: Recent Labs  Lab 11/22/23 0512 11/23/23 0510 11/25/23 0209 11/27/23 0235 11/27/23 1122 11/27/23 1342 11/28/23 0545  WBC 9.7 10.0 9.6 16.6*  --  18.9* 18.2*  NEUTROABS 5.7  --   --   --   --   --   --   HGB 8.1* 7.7* 8.1* 7.3* 7.4* 7.9* 7.2*  HCT 25.2* 24.2* 25.6* 23.4* 23.7* 24.3* 22.5*  MCV 90.0 90.6 90.8 91.4  --  90.7 90.7  PLT 236 250 222 218  --  233 243   Basic Metabolic Panel:  Recent Labs  Lab 11/23/23 0510 11/24/23 0841 11/25/23 0209 11/26/23 0256 11/27/23 0235 11/28/23 0545  NA 133* 132* 134* 135 134* 136  K 3.1* 3.8 4.0 3.5 3.6 3.5  CL 97* 96* 97* 96* 96* 96*  CO2 25 22 25 25 26 26   GLUCOSE 120* 201* 154* 148* 166* 153*  BUN 71* 79* 91* 93* 93* 92*  CREATININE 2.88* 2.75* 2.31* 2.10* 2.06* 1.99*  CALCIUM  8.4* 8.8* 8.7* 8.8* 9.0 9.2  MG 2.1 1.9 1.6* 2.5* 2.0  --   PHOS 4.7* 4.2 3.4 3.1 2.6  --    GFR: Estimated Creatinine Clearance: 33.2 mL/min (A) (by C-G formula based on SCr of 1.99 mg/dL (H)). Liver Function Tests: Recent Labs  Lab 11/23/23 0510 11/24/23 0841 11/25/23 0209 11/26/23 0256 11/27/23 0235  ALBUMIN  2.6* 2.6* 2.6* 2.7* 2.8*   No results for input(s): LIPASE, AMYLASE in the last 168 hours. No results for input(s): AMMONIA in the last 168 hours. Coagulation Profile: No results for input(s): INR, PROTIME in the last 168 hours. Cardiac Enzymes: No results for input(s): CKTOTAL, CKMB, CKMBINDEX, TROPONINI in the last 168 hours. BNP (last 3 results) No results for input(s): PROBNP in the last 8760  hours. HbA1C: No results for input(s): HGBA1C in the last 72 hours. CBG: Recent Labs  Lab 11/27/23 0601 11/27/23 1126 11/27/23 1615 11/27/23 2057 11/28/23 0543  GLUCAP 156* 192* 188* 179* 153*   Lipid Profile: No results for input(s): CHOL, HDL, LDLCALC, TRIG, CHOLHDL, LDLDIRECT in the last 72 hours. Thyroid Function Tests: No results for input(s): TSH, T4TOTAL, FREET4, T3FREE, THYROIDAB in the last 72 hours. Anemia Panel: Recent Labs    11/27/23 1122  VITAMINB12 714   Sepsis Labs: Recent Labs  Lab 11/28/23 0545  LATICACIDVEN 0.9    No results found for this or any previous visit (from the past 240 hours).       Radiology Studies: No results found.     LOS: 16 days   Time spent= 43 mins    Deliliah Room, MD [facility]  If 7PM-7AM, please contact night-coverage  11/28/2023, 10:32 AM

## 2023-11-28 NOTE — Progress Notes (Signed)
 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 Failed RHC 7/17 due to orthopnea. Developed gross hematuria and foley cath bladder irrigation. Hospitalist called PCCM for worsening hypoxia (3 L [place] to 6 L [place]), MAP 58 mmHg, and worsening SOB.   Pertinent  Medical History  Ischemic cardiomyopathy (CAD, EF 25%, G2DD), moderate-severe aortic stenosis, mild-moderate MR, HTN, PAD, dyslipidemia, hypothyroidism, DM-2, CKD-3a, GERD, Fe def anemia, nec fasc s/p left BKA  Significant Hospital Events: Including procedures, antibiotic start and stop dates in addition to other pertinent events   7/16 admission 7/17 ICU, intubated 7/18 started spiking fever with Tmax 101.5, tachycardic.  X-ray chest still showing bilateral infiltrate but FiO2 is coming down.  Lasix  infusion increased to 30 mg/h 7/19 started on CRRT, tolerating spontaneous breathing trial 7/20 extubated 7/22 CRRT holiday, Levophed /dobutamine  7/23 lasix  challenge, transfused 1u PRBC, remains low dose NE, DBA 2.5, CVP 7 7/24 laisx, off DBA 7/25: PCCM signed off 7/31: PCCM was called for eval. PCXR: worse pulm edema and bilateral pleural effusions. Negative 1.6 L fluid balance in the last 3 days  Interim History / Subjective:    Objective    Blood pressure (!) 88/56, pulse 100, temperature 97.6 F (36.4 C),  temperature source Oral, resp. rate 18, height 5' 4 (1.626 m), weight 84.4 kg, SpO2 99%.        Intake/Output Summary (Last 24 hours) at 11/28/2023 2130 Last data filed at 11/28/2023 1945 Gross per 24 hour  Intake 120 ml  Output 650 ml  Net -530 ml   Filed Weights   11/25/23 0449 11/26/23 0500 11/27/23 0437  Weight: 85.1 kg 86.7 kg 84.4 kg    Examination: General: alert, oriented x4, and mild resp distress. On 6 L [place]. SpO2 93%. Sinus. MAP 58 mmHg  HENT: PERL, normal pharynx and oral mucosa. No LNE or thyromegaly. +ve JVD Lungs: symmetrical air entry bilaterally. Diffuse wet crackles. No wheezing Cardiovascular: NL S2. ESM 4/6. No g/r Abdomen: no distension or tenderness Extremities: +2 edema. Left BKA. 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 Moderate-severe aortic stenosis, mild-moderate MR, HTN, PAD, dyslipidemia Not candidate for TAVR per HF team  -Transfer to ICU -IV [place]  -Midodrine  -?Levophed  for MAP>65 mmHg -?Dobutamine  -I/O chart -am lab -Resume meds -TFT -BiPAP -HOB elevation -NPO -Palliative team consult  Hypothyroidism -TFT -Resume same dose for now  DM-2 (HbA1c 5.5%) -Glycemic control  CKD-3a -Monitor BMP and UOP -Avoid nephrotoxins -Renal U/S  GERD -H2B  Fe def anemia -Monitor CBC -Oral Fe  Doubt UTI  -d/c Abx  Gross hematuria -Continue bladder irrigation -Consult urology   Best Practice (right click and Reselect all SmartList Selections daily)   Diet/type: NPO w/ oral meds DVT prophylaxis not indicated Pressure ulcer(s): N/A GI prophylaxis: H2B  Lines: none, just PIV Foley:  Yes, and it is still needed Code Status:  full code Last date of multidisciplinary goals of care discussion []   Labs   CBC: Recent Labs  Lab 11/22/23 0512 11/23/23 0510 11/25/23 0209  11/27/23 0235 11/27/23 1122 11/27/23 1342 11/28/23 0545 11/28/23 1501  WBC 9.7   < > 9.6 16.6*  --  18.9* 18.2* 29.2*  NEUTROABS 5.7  --   --   --   --   --   --  23.7*  HGB 8.1*   < > 8.1* 7.3* 7.4* 7.9* 7.2* 7.9*  HCT 25.2*   < > 25.6* 23.4* 23.7* 24.3* 22.5* 25.1*  MCV 90.0   < > 90.8 91.4  --  90.7 90.7 90.6  PLT 236   < > 222 218  --  233 243 361   < > = values in this interval not displayed.    Basic Metabolic Panel: Recent Labs  Lab 11/23/23 0510 11/24/23 0841 11/25/23 0209 11/26/23 0256 11/27/23 0235 11/28/23 0545  NA 133* 132* 134* 135 134* 136  K 3.1* 3.8 4.0 3.5 3.6 3.5  CL 97* 96* 97* 96* 96* 96*  CO2 25 22 25 25 26 26   GLUCOSE 120* 201* 154* 148* 166* 153*  BUN 71* 79* 91* 93* 93* 92*  CREATININE 2.88* 2.75* 2.31* 2.10* 2.06* 1.99*  CALCIUM  8.4* 8.8* 8.7* 8.8* 9.0 9.2  MG 2.1 1.9 1.6* 2.5* 2.0  --   PHOS 4.7* 4.2 3.4 3.1 2.6  --    GFR: Estimated Creatinine Clearance: 33.2 mL/min (A) (by C-G formula based on SCr of 1.99 mg/dL (H)). Recent Labs  Lab 11/27/23 0235 11/27/23 1342 11/28/23 0545 11/28/23 1501  WBC 16.6* 18.9* 18.2* 29.2*  LATICACIDVEN  --   --  0.9  --     Liver Function Tests: Recent Labs  Lab 11/23/23 0510 11/24/23 0841 11/25/23 0209 11/26/23 0256 11/27/23 0235  ALBUMIN  2.6* 2.6* 2.6* 2.7* 2.8*   No results for input(s): LIPASE, AMYLASE in the last 168 hours. No results for input(s): AMMONIA in the last 168 hours.  ABG    Component Value Date/Time   PHART 7.343 (L) 11/15/2023 0819   PCO2ART 45.2 11/15/2023 0819   PO2ART 117 (H) 11/15/2023 0819   HCO3 24.2 11/15/2023 0819   TCO2 25 11/15/2023 0819   ACIDBASEDEF 1.0 11/15/2023 0819   O2SAT 56.3 11/27/2023 0304     Coagulation Profile: No results for input(s): INR, PROTIME in the last 168 hours.  Cardiac Enzymes: No results for input(s): CKTOTAL, CKMB, CKMBINDEX, TROPONINI in the last 168 hours.  HbA1C: Hgb A1c MFr Bld  Date/Time Value Ref Range  Status  10/28/2023 07:40 PM 5.5 4.8 - 5.6 % Final    Comment:    (NOTE) Diagnosis of Diabetes The following HbA1c ranges recommended by the American Diabetes Association (ADA) may be used as an aid in the diagnosis of diabetes mellitus.  Hemoglobin             Suggested A1C NGSP%              Diagnosis  <5.7                   Non Diabetic  5.7-6.4                Pre-Diabetic  >6.4                   Diabetic  <7.0  Glycemic control for                       adults with diabetes.    08/02/2022 08:48 PM 6.4 (H) 4.8 - 5.6 % Final    Comment:    (NOTE)         Prediabetes: 5.7 - 6.4         Diabetes: >6.4         Glycemic control for adults with diabetes: <7.0     CBG: Recent Labs  Lab 11/28/23 0543 11/28/23 1115 11/28/23 1237 11/28/23 1634 11/28/23 2123  GLUCAP 153* 178* 169* 205* 207*    Review of Systems:   Review of Systems  Constitutional:  Positive for malaise/fatigue. Negative for chills, diaphoresis and fever.  HENT:  Positive for congestion. Negative for sinus pain and sore throat.   Respiratory:  Positive for shortness of breath. Negative for cough, hemoptysis, sputum production, wheezing and stridor.   Cardiovascular:  Positive for orthopnea, leg swelling and PND. Negative for chest pain and palpitations.  Gastrointestinal:  Negative for abdominal pain, heartburn, nausea and vomiting.  Genitourinary:  Positive for hematuria.  Skin:  Negative for itching and rash.     Past Medical History:  She,  has a past medical history of Anemia, Aortic stenosis, Arthritis, CHF (congestive heart failure) (HCC), Coronary artery disease, Diabetes mellitus without complication (HCC), GERD (gastroesophageal reflux disease), Heart murmur, History of blood transfusion, Hypercholesteremia, Hypertension, Hypothyroidism, Mitral regurgitation, Myocardial infarction [facility]), Peripheral vascular disease (HCC), PONV (postoperative nausea and vomiting), and Thyroid  disease.   Surgical History:   Past Surgical History:  Procedure Laterality Date   ABDOMINAL AORTOGRAM W/LOWER EXTREMITY Bilateral 01/14/2020   Procedure: ABDOMINAL AORTOGRAM W/LOWER EXTREMITY;  Surgeon: Gretta Lonni PARAS, MD;  Location: [facility] INVASIVE CV LAB;  Service: Cardiovascular;  Laterality: Bilateral;   ABDOMINAL AORTOGRAM W/LOWER EXTREMITY N/A 07/05/2022   Procedure: ABDOMINAL AORTOGRAM W/LOWER EXTREMITY;  Surgeon: Gretta Lonni PARAS, MD;  Location: [facility];  Service: Cardiovascular;  Laterality: N/A;   ABDOMINAL AORTOGRAM W/LOWER EXTREMITY N/A 08/02/2022   Procedure: ABDOMINAL AORTOGRAM W/LOWER EXTREMITY;  Surgeon: Gretta Lonni PARAS, MD;  Location: [facility];  Service: Cardiovascular;  Laterality: N/A;   ABDOMINAL AORTOGRAM W/LOWER EXTREMITY N/A 08/06/2022   Procedure: ABDOMINAL AORTOGRAM W/LOWER EXTREMITY;  Surgeon: Gretta Lonni PARAS, MD;  Location: [facility];  Service: Cardiovascular;  Laterality: N/A;   AMPUTATION Bilateral 07/20/2022   Procedure: AMPUTATION RIGHT GREAT TOE AND SECOND TOE, AMPUTATION LEFT GREAT TOE;  Surgeon: Harden Jerona GAILS, MD;  Location: [facility] OR;  Service: Orthopedics;  Laterality: Bilateral;   AMPUTATION Bilateral 08/08/2022   Procedure: BILATERAL TRANSMETATARSAL AMPUTATION;  Surgeon: Harden Jerona GAILS, MD;  Location: [facility] OR;  Service: Orthopedics;  Laterality: Bilateral;   AMPUTATION Left 10/22/2023   Procedure: AMPUTATION BELOW KNEE;  Surgeon: Lanis Fonda BRAVO, MD;  Location: [facility] OR;  Service: Vascular;  Laterality: Left;   AMPUTATION TOE     BIOPSY  04/06/2023   Procedure: BIOPSY;  Surgeon: Amaani Elspeth SQUIBB, MD;  Location: [facility] ENDOSCOPY;  Service: Gastroenterology;;   COLONOSCOPY WITH PROPOFOL  N/A 04/06/2023   Procedure: COLONOSCOPY WITH PROPOFOL ;  Surgeon: Ciaira Elspeth SQUIBB, MD;  Location: [facility] ENDOSCOPY;  Service: Gastroenterology;  Laterality: N/A;   ESOPHAGOGASTRODUODENOSCOPY (EGD) WITH PROPOFOL  N/A 04/06/2023   Procedure:  ESOPHAGOGASTRODUODENOSCOPY (EGD) WITH PROPOFOL ;  Surgeon: Folasade Elspeth SQUIBB, MD;  Location: [facility] ENDOSCOPY;  Service: Gastroenterology;  Laterality: N/A;   GIVENS CAPSULE STUDY  N/A 04/06/2023   Procedure: GIVENS CAPSULE STUDY;  Surgeon: Catrena Elspeth SQUIBB, MD;  Location: [facility] ENDOSCOPY;  Service: Gastroenterology;  Laterality: N/A;   PERIPHERAL VASCULAR ATHERECTOMY Right 01/14/2020   Procedure: PERIPHERAL VASCULAR ATHERECTOMY;  Surgeon: Gretta Lonni PARAS, MD;  Location: [facility] INVASIVE CV LAB;  Service: Cardiovascular;  Laterality: Right;  sfa   PERIPHERAL VASCULAR INTERVENTION Right 01/14/2020   Procedure: PERIPHERAL VASCULAR INTERVENTION;  Surgeon: Gretta Lonni PARAS, MD;  [facility]: [facility];  Service: Cardiovascular;  Laterality: Right;  sfa stent    PERIPHERAL VASCULAR INTERVENTION  07/05/2022   Procedure: PERIPHERAL VASCULAR INTERVENTION;  Surgeon: Gretta Lonni PARAS, MD;  [facility];  Service: Cardiovascular;;   PERIPHERAL VASCULAR INTERVENTION  08/02/2022   Procedure: PERIPHERAL VASCULAR INTERVENTION;  Surgeon: Gretta Lonni PARAS, MD;  [facility];  Service: Cardiovascular;;   PERIPHERAL VASCULAR INTERVENTION  08/06/2022   Procedure: PERIPHERAL VASCULAR INTERVENTION;  Surgeon: Gretta Lonni PARAS, MD;  Location: [facility] INVASIVE CV LAB;  Service: Cardiovascular;;   PERIPHERAL VASCULAR THROMBECTOMY  08/02/2022   Procedure: PERIPHERAL VASCULAR THROMBECTOMY;  Surgeon: Gretta Lonni PARAS, MD;  [facility];  Service: Cardiovascular;;   REVISION AMPUTATION, BELOW THE KNEE Left 10/23/2023   Procedure: REVISION AMPUTATION, BELOW THE KNEE;  Surgeon: Harden Jerona GAILS, MD;  Location: [facility] OR;  Service: Orthopedics;  Laterality: Left;  REVISION LEFT BELOW KNEE AMPUTATION   RIGHT/LEFT HEART CATH AND CORONARY ANGIOGRAPHY N/A 08/23/2022   Procedure: RIGHT/LEFT HEART CATH AND CORONARY ANGIOGRAPHY;  Surgeon: Dann Candyce RAMAN, MD;  Location: [facility] INVASIVE CV  LAB;  Service: Cardiovascular;  Laterality: N/A;   SKIN SPLIT GRAFT Right 03/18/2020   Procedure: SKIN GRAFTING RIGHT FOOT ULCER;  Surgeon: Harden Jerona GAILS, MD;  Location: [facility] OR;  Service: Orthopedics;  Laterality: Right;   STUMP REVISION Right 11/30/2022   Procedure: REVISION RIGHT TRANSMETATARSAL AMPUTATION;  Surgeon: Harden Jerona GAILS, MD;  Location: [facility] OR;  Service: Orthopedics;  Laterality: Right;   TEE WITHOUT CARDIOVERSION N/A 08/27/2022   Procedure: TRANSESOPHAGEAL ECHOCARDIOGRAM;  Surgeon: Hobart Powell BRAVO, MD;  Location: [facility] INVASIVE CV LAB;  Service: Cardiovascular;  Laterality: N/A;   TRANSESOPHAGEAL ECHOCARDIOGRAM (CATH LAB) N/A 10/24/2023   Procedure: TRANSESOPHAGEAL ECHOCARDIOGRAM;  Surgeon: Michele Richardson, DO;  Location: [facility];  Service: Cardiovascular;  Laterality: N/A;   WISDOM TOOTH EXTRACTION       Social History:   reports that she quit smoking about 6 years ago. Her smoking use included cigarettes. She has never used smokeless tobacco. She reports that she does not currently use alcohol. She reports current drug use. Drug: Marijuana.   Family History:  Her family history includes CAD in an other family member; Diabetes in an other family member.   Allergies Allergies  Allergen Reactions   Trental  [Pentoxifylline ] Nausea And Vomiting   Vibramycin [Doxycycline] Nausea And Vomiting     Home Medications  Prior to Admission medications   Medication Sig Start Date End Date Taking? Authorizing Provider  acetaminophen  (TYLENOL ) 500 MG tablet Take 1,000 mg by mouth 2 (two) times daily as needed for moderate pain (pain score 4-6), headache or fever.   Yes [provider]  Alcohol Swabs  (PHARMACIST CHOICE ALCOHOL) PADS Apply topically. 04/09/23  Yes [provider]  aspirin  81 MG EC tablet Take 81 mg by mouth daily.   Yes [provider]  atorvastatin  (LIPITOR) 40 MG tablet Take 40 mg by mouth daily. 12/14/19  Yes [provider]  bisacodyl  (DULCOLAX) 10 MG suppository Place 1 suppository (10 mg total) rectally daily as needed for moderate constipation or mild constipation. 10/29/23  Yes Hongalgi, Anand D, MD  clopidogrel  (PLAVIX ) 75 MG tablet TAKE 1 TABLET BY MOUTH EVERY DAY WITH BREAKFAST 01/28/23  Yes Sheree Penne Bruckner, MD  diphenhydrAMINE  HCl, Sleep, (ZZZQUIL) 25 MG CAPS Take 25 mg by mouth at bedtime as needed (Sleep).   Yes [provider]  ferrous sulfate  325 (65 FE) MG EC tablet Take 1 tablet (325 mg total) by mouth daily with breakfast. 04/07/23 04/06/24 Yes Fairy Frames, MD  furosemide  (LASIX ) 40 MG tablet Take 1 tablet (40 mg total) by mouth 2 (two) times daily. 08/27/22  Yes Leotis Bogus, MD  gabapentin  (NEURONTIN ) 300 MG capsule Take 300 mg by mouth at bedtime.   Yes [provider]  levothyroxine  (SYNTHROID ) 150 MCG tablet Take 150 mcg by mouth daily before breakfast. 12/02/19  Yes [provider]  metoprolol  succinate (TOPROL -XL) 25 MG 24 hr tablet Take 0.5 tablets (12.5 mg total) by mouth daily. 08/28/22  Yes Leotis Bogus, MD  oxyCODONE  (ROXICODONE ) 5 MG immediate release tablet Take 1 tablet (5 mg total) by mouth every 6 (six) hours as needed for severe pain (pain score 7-10). 10/15/23  Yes Francesca Elsie CROME, MD  pantoprazole  (PROTONIX ) 40 MG tablet Take 40 mg by mouth daily. 12/21/19  Yes [provider]  polyethylene glycol (MIRALAX  / GLYCOLAX ) 17 g packet Take 17 g by mouth 2 (two) times daily. 10/29/23  Yes Hongalgi, Anand D, MD  promethazine  (PHENERGAN ) 25 MG tablet Take 1 tablet (25 mg total) by mouth every 6 (six) hours as needed for nausea or vomiting. 10/15/23  Yes Francesca Elsie CROME, MD  senna-docusate (SENOKOT-S) 8.6-50 MG tablet Take 1 tablet by mouth 2 (two) times daily. 10/29/23  Yes Hongalgi, Anand D, MD  insulin  degludec (TRESIBA  FLEXTOUCH) 100 UNIT/ML FlexTouch Pen Inject 6 Units into the skin at bedtime. 11/27/23   Arrien, Mauricio Daniel, MD  Multiple  Vitamin (MULTIVITAMIN WITH MINERALS) TABS tablet Take 1 tablet by mouth daily. 11/28/23   Arrien, Mauricio Daniel, MD  nitroGLYCERIN  (NITROSTAT ) 0.4 MG SL tablet Place 1 tablet (0.4 mg total) under the tongue every 5 (five) minutes x 3 doses as needed for chest pain (if no relief after 3rd dose proceed to ED or call 911). 11/06/2022-New 11/06/22   Mallipeddi, Vishnu P, MD  torsemide  (DEMADEX ) 20 MG tablet Take 4 tablets (80 mg total) by mouth 2 (two) times daily. 11/27/23   Arrien, Elidia Sieving, MD     Critical care time: 55 min     Mancel Ply, MD, [facility]  [place] [facility] [phone] or if no answer before 7:00PM call [phone] For any issues after 7:00PM please call eLink [phone]

## 2023-11-28 NOTE — Plan of Care (Signed)
  Problem: Clinical Measurements: Goal: Ability to maintain clinical measurements within normal limits will improve Outcome: Not Progressing Goal: Will remain free from infection Outcome: Not Progressing Goal: Diagnostic test results will improve Outcome: Progressing Goal: Respiratory complications will improve Outcome: Progressing Goal: Cardiovascular complication will be avoided Outcome: Progressing   Problem: Activity: Goal: Risk for activity intolerance will decrease Outcome: Progressing   Problem: Nutrition: Goal: Adequate nutrition will be maintained Outcome: Progressing   Problem: Coping: Goal: Level of anxiety will decrease Outcome: Progressing   Problem: Elimination: Goal: Will not experience complications related to bowel motility Outcome: Not Progressing Goal: Will not experience complications related to urinary retention Outcome: Progressing   Problem: Pain Managment: Goal: General experience of comfort will improve and/or be controlled Outcome: Progressing   Problem: Safety: Goal: Ability to remain free from injury will improve Outcome: Progressing   Problem: Skin Integrity: Goal: Risk for impaired skin integrity will decrease Outcome: Progressing   Problem: Coping: Goal: Ability to adjust to condition or change in health will improve Outcome: Progressing   Problem: Fluid Volume: Goal: Ability to maintain a balanced intake and output will improve Outcome: Progressing   Problem: Metabolic: Goal: Ability to maintain appropriate glucose levels will improve Outcome: Progressing   Problem: Nutritional: Goal: Maintenance of adequate nutrition will improve Outcome: Progressing Goal: Progress toward achieving an optimal weight will improve Outcome: Progressing   Problem: Skin Integrity: Goal: Risk for impaired skin integrity will decrease Outcome: Progressing   Problem: Tissue Perfusion: Goal: Adequacy of tissue perfusion will improve Outcome:  Progressing   Problem: Cardiovascular: Goal: Ability to achieve and maintain adequate cardiovascular perfusion will improve Outcome: Progressing

## 2023-11-28 NOTE — Plan of Care (Signed)
  Problem: Clinical Measurements: Goal: Ability to maintain clinical measurements within normal limits will improve Outcome: Progressing Goal: Will remain free from infection Outcome: Progressing Goal: Diagnostic test results will improve Outcome: Progressing Goal: Respiratory complications will improve Outcome: Progressing Goal: Cardiovascular complication will be avoided Outcome: Progressing   Problem: Activity: Goal: Risk for activity intolerance will decrease Outcome: Progressing   Problem: Nutrition: Goal: Adequate nutrition will be maintained Outcome: Progressing   Problem: Coping: Goal: Level of anxiety will decrease Outcome: Progressing   Problem: Elimination: Goal: Will not experience complications related to bowel motility Outcome: Progressing Goal: Will not experience complications related to urinary retention Outcome: Progressing   Problem: Pain Managment: Goal: General experience of comfort will improve and/or be controlled Outcome: Progressing   Problem: Safety: Goal: Ability to remain free from injury will improve Outcome: Progressing   Problem: Skin Integrity: Goal: Risk for impaired skin integrity will decrease Outcome: Progressing   Problem: Coping: Goal: Ability to adjust to condition or change in health will improve Outcome: Progressing   Problem: Fluid Volume: Goal: Ability to maintain a balanced intake and output will improve Outcome: Progressing   Problem: Metabolic: Goal: Ability to maintain appropriate glucose levels will improve Outcome: Progressing   Problem: Nutritional: Goal: Maintenance of adequate nutrition will improve Outcome: Progressing Goal: Progress toward achieving an optimal weight will improve Outcome: Progressing   Problem: Skin Integrity: Goal: Risk for impaired skin integrity will decrease Outcome: Progressing   Problem: Tissue Perfusion: Goal: Adequacy of tissue perfusion will improve Outcome: Progressing    Problem: Activity: Goal: Ability to return to baseline activity level will improve Outcome: Progressing   Problem: Cardiovascular: Goal: Ability to achieve and maintain adequate cardiovascular perfusion will improve Outcome: Progressing

## 2023-11-28 NOTE — Progress Notes (Signed)
 Patient ID: Kayla Schmitt, female   DOB: 15-Aug-1965, 58 y.o.   MRN: [id number] [facility] Progress Note   Assessment/ Plan:   1. Acute kidney Injury on chronic kidney disease stage III (baseline creatinine appears 1.3-1.6): Hemodynamically mediated in the setting of acute exacerbation of congestive heart failure, transiently on CRRT that was discontinued on 7/22.  She is continue maintain decent urine output and gradually downtrending creatinine with ongoing diuretic support.  Transitioned from IV furosemide  80 mg to torsemide  80 mg twice daily on 7/27 (AM dose only on 7/28).  Cumulative 2.5 L net negative during this hospitalization. UOP 1.2L/24h but not everything has been measured.  There were problems with the pure wick on 7/27 and then some of the urine output via the commode was not measured.  Only rx AM dose of torsemide  on 7/28; UOP is not all being collected. Blood in canister but pt denies gross blood. Restart torsemide  today, recheck U/A, alb low but BOOST supplementation may be contributing to the catabolic state.   Overall renal function stable but certainly azotemic (multi factorial with CHF, protein supplementation, catabolic state)  Removed the right IJ temporary catheter on 7/30 without any complication; pressure bandage applied (still white with no blood tinge).    Renal standpoint we can follow her as an outpatient closely.  I've already contacted the clinic to schedule appointment in 2 weeks..  Signing off at this time; please reconsult as needed.  2.  Acute exacerbation of congestive heart failure/cardiogenic shock: History of congestive heart failure with reduced ejection fraction and decompensation noted during this hospitalization.  Refractory to diuretics in the setting of inotropic/pressor support and started on CRRT transiently for extracorporeal volume unloading.  Fair urine output augmented by ongoing diuresis.  No indication for dialysis. 3.   Hyponatremia: Hypervolemic hyponatremia in the setting of acute kidney injury/CHF exacerbation, monitor with diuresis and renal recovery. 4.  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  Continue antibiotics per primary service. 5.  Anemia: Secondary to critical illness and prior history of GI bleed.  PRBC transfusion done yesterday with stable H/H overnight. 6. UTI: started on Abx on 7/30  Subjective:   Denies any more dysuria this AM; denies f/c/n/v.  Mother  bedside updated as well   Objective:   BP (!) 85/58 (BP Location: Right Arm) Comment: Second reading. MAde nurse aware.  Pulse 91   Temp 98.3 F (36.8 C) (Oral)   Resp 20   Ht 5' 4 (1.626 m)   Wt 84.4 kg   SpO2 93%   BMI 31.94 kg/m   Intake/Output Summary (Last 24 hours) at 11/28/2023 0843 Last data filed at 11/28/2023 0317 Gross per 24 hour  Intake 240 ml  Output 600 ml  Net -360 ml   Weight change:   Physical Exam: Gen: Comfortably resting in bed, watching television. CVS: Pulse regular rhythm, normal rate S1 and S2 normal Resp: No audible rales/rhonchi at this time, poor inspiratory effort Abd: Soft, obese, nontender, bowel sounds normal Ext: Status post left BKA and right forefoot amputation.  Trace edema over dependent areas..  Imaging: No results found.   Labs: BMET Recent Labs  Lab 11/22/23 9487 11/23/23 0510 11/24/23 0841 11/25/23 0209 11/26/23 0256 11/27/23 0235 11/28/23 0545  NA 130* 133* 132* 134* 135 134* 136  K 3.5 3.1* 3.8 4.0 3.5 3.6 3.5  CL 95* 97* 96* 97* 96* 96* 96*  CO2 22 25  22 25 25 26 26   GLUCOSE 121* 120* 201* 154* 148* 166* 153*  BUN 61* 71* 79* 91* 93* 93* 92*  CREATININE 2.98* 2.88* 2.75* 2.31* 2.10* 2.06* 1.99*  CALCIUM  8.2* 8.4* 8.8* 8.7* 8.8* 9.0 9.2  PHOS 4.8* 4.7* 4.2 3.4 3.1 2.6  --    CBC Recent Labs  Lab 11/22/23 0512 11/23/23 0510 11/25/23 0209 11/27/23 0235 11/27/23 1122  11/27/23 1342 11/28/23 0545  WBC 9.7   < > 9.6 16.6*  --  18.9* 18.2*  NEUTROABS 5.7  --   --   --   --   --   --   HGB 8.1*   < > 8.1* 7.3* 7.4* 7.9* 7.2*  HCT 25.2*   < > 25.6* 23.4* 23.7* 24.3* 22.5*  MCV 90.0   < > 90.8 91.4  --  90.7 90.7  PLT 236   < > 222 218  --  233 243   < > = values in this interval not displayed.    Medications:     aspirin   81 mg Oral Daily   atorvastatin   40 mg Oral Daily   bethanechol   10 mg Oral TID   Chlorhexidine  Gluconate Cloth  6 each Topical Daily   docusate sodium   100 mg Oral BID   feeding supplement  237 mL Oral Q1400   gabapentin   300 mg Oral QHS   heparin  injection (subcutaneous)  5,000 Units Subcutaneous Q12H   insulin  aspart  0-15 Units Subcutaneous TID WC   insulin  glargine-yfgn  6 Units Subcutaneous BID   levothyroxine   150 mcg Oral Q0600   multivitamin with minerals  1 tablet Oral Daily   nutrition supplement (JUVEN)  1 packet Oral BID AC & HS   oxidized cellulose  1 each Topical Once   pantoprazole   40 mg Oral Daily   potassium chloride   40 mEq Oral Once   sodium chloride  flush  10-40 mL Intracatheter Q12H   torsemide   80 mg Oral BID

## 2023-11-28 NOTE — TOC CM/SW Note (Signed)
  Per PT note from Carly Poff PTA  SATURATION QUALIFICATIONS: (This note is used to comply with regulatory documentation for home oxygen )   Patient Saturations on Room Air at Rest = 90%   Patient Saturations on Room Air with Exertion = 87%   Patient Saturations on 2 Liters of oxygen  with Exertion = 90% and above   Please briefly explain why patient needs home oxygen : Pt hypoxic on Room Air with exertional tasks (scooting OOB to drop arm wheelchair).

## 2023-11-28 NOTE — Progress Notes (Addendum)
 Patient ID: Kayla Schmitt, female   DOB: 08/19/1965, 58 y.o.   MRN: [id number]     Advanced Heart Failure Rounding Note  Cardiologist: Vishnu SHAUNNA Maywood, MD  AHF Consulting MD: Dr. Cherrie Chief Complaint: Acute on chronic HFpEF / RV Failure Patient Profile   59 y.o. female with history of PAD with prior SFA stents and bilateral TMA, severe 3v CAD, MR/AS, DM II, anemia, HTN, HLD, hypothyroidism.  Admitted in 6/25 with MRSA bactermia and necrotizing fasciitis s/p left BKA. Transferred back to [facility] from rehab d/t acute on chronic CHF with concern for low-output and acute respiratory failure.  Subjective:    7/18: aborted cath for acute respiratory distress; intubated; DBA started 7/19: started on CRRT 7/20: extubated 7/21: hgb 6.5 s/p 1u RBCs 7/23: CRRT stopped  - . Cr 2.75>2.31>2.1>2.06>1.99 BUN 79>91>93>93>92  Hgb 8.1>7.3>7.2  WBC staying in the 20s. UA with UTI. Started on PO abx yesterday, switched to IV this morning.   Feels great sitting in recliner. Denies CP/SOB  Objective:    Weight Range: 84.4 kg Body mass index is 31.94 kg/m.   Vital Signs:   Temp:  [97.8 F (36.6 C)-99.6 F (37.6 C)] 98.3 F (36.8 C) (07/31 0739) Pulse Rate:  [87-97] 91 (07/31 0313) Resp:  [16-23] 20 (07/31 0313) BP: (85-103)/(47-66) 85/58 (07/31 0313) SpO2:  [87 %-99 %] 93 % (07/31 0313) Last BM Date : 11/24/23  Weight change: Filed Weights   11/25/23 0449 11/26/23 0500 11/27/23 0437  Weight: 85.1 kg 86.7 kg 84.4 kg   Intake/Output:  Intake/Output Summary (Last 24 hours) at 11/28/2023 0814 Last data filed at 11/28/2023 0317 Gross per 24 hour  Intake 240 ml  Output 600 ml  Net -360 ml    Physical Exam   General:  Jaundices and chronically ill appearing.  No respiratory difficulty Neck: JVD ~6 cm.  Cor: Regular rate & rhythm. No murmurs. Lungs: clear, diminished bases Extremities: Trace R ankle edema  Neuro: alert & oriented x 3. Affect pleasant.   Telemetry    ST low 100s. Short ~10 beat NSVT run (personally reviewed)  Labs    CBC Recent Labs    11/27/23 1342 11/28/23 0545  WBC 18.9* 18.2*  HGB 7.9* 7.2*  HCT 24.3* 22.5*  MCV 90.7 90.7  PLT 233 243   Basic Metabolic Panel Recent Labs    92/70/74 0256 11/27/23 0235 11/28/23 0545  NA 135 134* 136  K 3.5 3.6 3.5  CL 96* 96* 96*  CO2 25 26 26   GLUCOSE 148* 166* 153*  BUN 93* 93* 92*  CREATININE 2.10* 2.06* 1.99*  CALCIUM  8.8* 9.0 9.2  MG 2.5* 2.0  --   PHOS 3.1 2.6  --    Liver Function Tests Recent Labs    11/26/23 0256 11/27/23 0235  ALBUMIN  2.7* 2.8*   BNP (last 3 results) Recent Labs    11/03/23 0112 11/07/23 1457 11/13/23 0521  BNP 793.8* 336.4* 1,626.5*   Medications:    Scheduled Medications:  aspirin   81 mg Oral Daily   atorvastatin   40 mg Oral Daily   bethanechol   10 mg Oral TID   Chlorhexidine  Gluconate Cloth  6 each Topical Daily   docusate sodium   100 mg Oral BID   feeding supplement  237 mL Oral Q1400   gabapentin   300 mg Oral QHS   heparin  injection (subcutaneous)  5,000 Units Subcutaneous Q12H   insulin  aspart  0-15 Units Subcutaneous TID WC   insulin  glargine-yfgn  6 Units Subcutaneous  BID   levothyroxine   150 mcg Oral Q0600   multivitamin with minerals  1 tablet Oral Daily   nutrition supplement (JUVEN)  1 packet Oral BID AC & HS   oxidized cellulose  1 each Topical Once   pantoprazole   40 mg Oral Daily   sodium chloride  flush  10-40 mL Intracatheter Q12H   torsemide   80 mg Oral BID   Infusions:  cefTRIAXone  (ROCEPHIN )  IV      PRN Medications: acetaminophen , Gerhardt's butt cream, ipratropium-albuterol , lidocaine , mouth rinse, mouth rinse, oxyCODONE , polyethylene glycol, prochlorperazine , senna-docusate, sodium chloride  flush  Assessment/Plan  Acute on chronic HFpEF>Mixed Shock; Cardiogenic/Septic - TEE 6/25: EF 60-65%, RV okay, mild to mod MR, moderate-severe AS with mG 27 mmHg and AVA 1.07 cm2 - TTE 11/15/23 LVEF 25% - Taken  for [facility] 7/17, however developed flash pulmonary edema. Unable to lie flat for procedure.  - Repeat echo with EF 25-30%, G2DD, mild to mod MR, mod-severe AS  - Continue Torsemide  80 mg bid. Diuresis per Nephrology. AKI, transiently required CVVH. Now off and nephrology has been managing diuretics. UOP difficult to acquire. - GDMT limited by renal function and hypotension (SBP 90s generally, one soft reading in the 80s today).   2. Acute respiratory failure with hypoxia - CXR 7/20 with b/l diffuse airspace opacities, likely pulmonary edema, though given fevers also concern for PNA - resolved.    3. CAD - NSTEMI in 4/24. Multivessel CAD. CABG had been recommended but managed medically due to other active medical issues - Further ischemic eval pending renal function => no cath with AKI.  - continue aspirin  + statin   4. Valvular heart disease - Mild to moderate MR and moderate to severe AS on echo 6/25 - Repeat echo 7/25 with extremely poor windows, based on exam expect AS moderate-severe - Echo 11/20/23 with Vmax 3.21, DVT 0.26, mod to severe AS, mG 22 - However, she is not a TAVR or SAVR candidate currently so even if she has severe AS it would not change current management   5. MRSA bacteremia - Necrotizing fasciitis - S/p recent left BKA 6/25; S/p TMA in 4/24  6. Leukocytosis - resolved  7. PAD - hx SFA stents - On aspirin  + statin. Had been on plavix  but stopped d/t risk of bleeding.   8. Anemia - s/p 1 u RBC 7/14 - Hx GI bleed from AVMs, ? D/t aortic valve stenosis - hgb 6.5>1u RBC>7.4>6.9>1u RBC>8.4>8.1>7.3>7.2 - Blood tx per primary team - fecal occult negative - will need Fe infusions in OP   9. AKI on CKD IIIa - In setting of volume overload/low-output; volume now improved. - Started on CRRT 7/19. Now off 7/22.  - Some renal recovery - Making urine, SCr/ BUN stable.   - Nephrology managing appreciate recs.   10. UTI - Discharge held yesterday with leukocytosis and  dysuria. Started on PO Keflex  yesterday. Switched to IV rocephin  today.  - Per primary team - WBC 18.9>18.2  Stable for discharge from AHF team perspective whenever medically stable per primary team.  Has close f/u in AHF clinic. AHF meds at discharge:  ASA 81 mg daily, atorvastatin  40 mg daily, torsemide  80 mg BID (as long as nephrology ok with this dose)  AHF team will sign off today. Please reach back out to us  if further recs are needed.    Beckey LITTIE Coe, NP  11/28/2023, 8:14 AM  Advanced Heart Failure Team Pager [phone] (M-F; 7a - 5p)  Please  contact [facility] for night-coverage after hours (5p -7a ) and weekends on [email or web address]  Patient seen with NP, I formulated the plan and agree with the above note.   WBCs 18.2 today, UTI on UA. She was started on ceftriaxone .    BUN/creatinine slowly trending down.    She is disappointed that she cannot go home yet.   General: NAD Neck: No JVD, no thyromegaly or thyroid nodule.  Lungs: Clear to auscultation bilaterally with normal respiratory effort. CV: Nondisplaced PMI.  Heart regular S1/S2, no S3/S4, 3/6 SEM RUSB with obscured S2.  No peripheral edema.   Abdomen: Soft, nontender, no hepatosplenomegaly, no distention.  Skin: Intact without lesions or rashes.  Neurologic: Alert and oriented x 3.  Psych: Normal affect. Extremities: No clubbing or cyanosis. Left BKA.  HEENT: Normal.   Volume status looks ok and renal function slowly improving.  - Continue torsemide  80 mg bid.  - No GDMT yet given SBP around 90, elevated creatinine, and UTI.   Follow valvular heart disease over time, currently not TAVR/SAVR candidate.   [facility] treating UTI with ceftriaxone .   Cardiology will sign off.  See med recs and followup above.   Ezra Shuck 11/28/2023 9:20 AM

## 2023-11-28 NOTE — Progress Notes (Signed)
 Physical Therapy Treatment Patient Details Name: Kayla Schmitt MRN: [id number] DOB: 1965/05/24 Today's Date: 11/28/2023   History of Present Illness 58 y.o female admitted 7/15 from [facility] for SOB, CHF exacerbation. 7/17 attempted RHC but couldn't lie flat with pulmonary edema and decompensation requiring intubation. 7/19 CRRT. 7/20 extubation. Planned discharge 7/30 held due to leukocytosis, patient having dysuria. PMHx: admission 6/22-7/1 for LLE pain, sepsis, necrotizing fasciitis. 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 CAD, aortic stenosis, HTN, CHF, PAD, bil transmet amputation, HLD, hypothyroidsim, T2DM.    PT Comments  PTA called back to room to assist with mechanical lift from chair to bed once lab done with drawing multiple samples. Pt reluctant due to c/o dypsnea but with encouragement and +2 staff assist for safety, pt tolerated mechanical lift to bed from recliner with mod cues for attention to task as pt at risk of bumping her head/body on railings occasionally due to lethargy/impulsivity. Patient will benefit from intensive inpatient follow-up therapy, >3 hours/day pending progress, pt with decline in function apparently due to vitals/labs, hopeful to be able to return to HHPT once symptoms improve. RN aware pt c/o air hunger but SpO2 appears WFL on 4L/min O2 [place] at end of session.     If plan is discharge home, recommend the following: A lot of help with walking and/or transfers;A lot of help with bathing/dressing/bathroom;Assistance with cooking/housework;Assist for transportation;Help with stairs or ramp for entrance;Other (comment) (some supervision due to waxing/waning cognition this date)   Can travel by private vehicle        Equipment Recommendations  None recommended by PT    Recommendations for Other Services       Precautions / Restrictions Precautions Precautions: Fall;Other (comment) Recall of Precautions/Restrictions: Impaired Precaution/Restrictions Comments: Contact precs; R  transmet amputation, L BKA Required Braces or Orthoses: Other Brace Other Brace: Limb guard (benefits from encouragement to don/maintain) Restrictions Weight Bearing Restrictions Per Provider Order: Yes LLE Weight Bearing Per Provider Order: Non weight bearing Other Position/Activity Restrictions: Date of surgery 6/24     Mobility  Bed Mobility Overal bed mobility: Needs Assistance Bed Mobility: Rolling, Sidelying to Sit Rolling: Contact guard assist, Used rails Sidelying to sit: Contact guard assist, HOB elevated Supine to sit: Contact guard     General bed mobility comments: Rolling to L/R in bed for removal of hoyer pad and removal/replacement of paper bed pad x2 reps ea side after hoyer back to bed from chair. Pt propping also on L/R elbows and CGA for return to long sitting due to decreased awareness of furniture/lines and pt almost bumping her head on lift equipment around her during session, CGA for safety.    Transfers Overall transfer level: Needs assistance   Transfers: Bed to chair/wheelchair/BSC             General transfer comment: Maxi-move chair to bed once NT arrived for pt safety, pt tolerated well with lift in upright chair posture and states can I just stay like this? Transfer via Lift Equipment: Maximove  Ambulation/Gait                   Stairs             Wheelchair Mobility     Tilt Bed    Modified Rankin (Stroke Patients Only)       Balance Overall balance assessment: Needs assistance Sitting-balance support: No upper extremity supported, Feet unsupported Sitting balance-Leahy Scale: Fair Sitting balance - Comments: pt tending to use BUE support today due to  increased fatigue/dypsnea       Standing balance comment: pt refusing to attempt STS to standing scale today                            Communication Communication Communication: No apparent difficulties  Cognition Arousal: Lethargic (waxing/waning,  pt tends to maintain eyes closed but verbally responds when cued.) Behavior During Therapy: Flat affect, Anxious   PT - Cognitive impairments: Attention, Safety/Judgement, Initiation, Awareness                       PT - Cognition Comments: Pt distracted this date due to c/o shortness of breath. Following commands: Impaired Following commands impaired: Only follows one step commands consistently, Follows multi-step commands inconsistently    Cueing Cueing Techniques: Verbal cues, Gestural cues  Exercises Amputee Exercises Hip Flexion/Marching: AROM, AAROM, Both, 5 reps, Seated (while repositioning/placing hoyer lift pad behind her) Knee Flexion: AROM, Left, 5 reps, Seated Knee Extension: AROM, Left, 5 reps, Seated Other Exercises Other Exercises: education provided regarding weight shifting/pressure relief, posture for better oxygenation while seated/not to lean forward excessively and hunch shoulders as this closes lungs up more, and pursed-lip breathing x10 reps Other Exercises: L/R leans x3 reps while repositioning pad behind hips to either side    General Comments General comments (skin integrity, edema, etc.): Pt unable to participate in limb guard placement/removal today due to lethargy/distraction and fatigue.      Pertinent Vitals/Pain Pain Assessment Pain Assessment: PAINAD Breathing: noisy labored breathing, long periods of hyperventilation, Cheyne-Stokes respirations Negative Vocalization: occasional moan/groan, low speech, negative/disapproving quality Facial Expression: sad, frightened, frown Body Language: relaxed Consolability: distracted or reassured by voice/touch PAINAD Score: 5 Pain Location: generalized; pt with noted dark brownish red color to paper pad on her chair when she rolls for placement of hoyer lift pad; NT/RN notified and are alreay aware; pt with no specific c/o pain, just shortness of breath Pain Descriptors / Indicators: Grimacing,  Guarding Pain Intervention(s): Limited activity within patient's tolerance, Monitored during session, Repositioned    Home Living                          Prior Function            PT Goals (current goals can now be found in the care plan section) Acute Rehab PT Goals Patient Stated Goal: To go home but not wear myself out too much PT Goal Formulation: Patient unable to participate in goal setting Time For Goal Achievement: 11/30/23 Progress towards PT goals: Not progressing toward goals - comment    Frequency    Min 2X/week      PT Plan      Co-evaluation              AM-PAC PT 6 Clicks Mobility   Outcome Measure  Help needed turning from your back to your side while in a flat bed without using bedrails?: None Help needed moving from lying on your back to sitting on the side of a flat bed without using bedrails?: None Help needed moving to and from a bed to a chair (including a wheelchair)?: A Lot Help needed standing up from a chair using your arms (e.g., wheelchair or bedside chair)?: Total Help needed to walk in hospital room?: Total Help needed climbing 3-5 steps with a railing? : Total 6 Click Score: 13    End  of Session Equipment Utilized During Treatment: Oxygen ;Other (comment) (LLE limb guard placed then removed once pt back in supine) Activity Tolerance: Patient limited by fatigue;Treatment limited secondary to medical complications (Comment);Other (comment) (dypsnea 3-4/4 and c/o fatigue limiting activity tolerance) Patient left: with call bell/phone within reach;with family/visitor present;Other (comment);in bed;with bed alarm set (pt's mother present; x4 pillows for pt comfort to maintain ~90 degree posture as pt reporting to dyspneic to recline even to 60-70 degrees with bed in chair posture) Nurse Communication: Need for lift equipment;Mobility status;Precautions;Other (comment) (shortness of breath, O2 increased to 4L/min due to mild  hypoxia and air hunger) PT Visit Diagnosis: Unsteadiness on feet (R26.81);Other abnormalities of gait and mobility (R26.89);Muscle weakness (generalized) (M62.81);Difficulty in walking, not elsewhere classified (R26.2)     Time: 8548-8485 PT Time Calculation (min) (ACUTE ONLY): 23 min  Charges:    $Therapeutic Activity: 23-37 mins PT General Charges $$ ACUTE PT VISIT: 1 Visit                     Daniele Yankowski P., PTA Acute Rehabilitation Services Secure Chat Preferred 9a-5:30pm Office: [phone]    Connell HERO [facility] 11/28/2023, 4:12 PM

## 2023-11-28 NOTE — Progress Notes (Signed)
 Pt's BP 79/56 MAP 65, HR 90. Pt reports no symptoms. Attempted to notify Franky, MD. Awaiting further orders.  Lonell LITTIE Lyme, RN

## 2023-11-28 NOTE — Progress Notes (Signed)
 eLink Physician-Brief Progress Note Patient Name: Kayla Schmitt DOB: 1965/12/30 MRN: [id number]   Date of Service  11/28/2023  HPI/Events of Note  58 year old female with a history of ischemic cardiomyopathy.  Reduced ejection fraction and moderate to severe aortic stenosis MMR, metabolic syndrome, CKD and recent intubation along with recent left BKA secondary to necrotizing fasciitis on presented with dyspnea on 7/15, failed optimization with right heart cath due to persistent orthopnea and transferred to the ICU for worsening dyspnea and hypoxemia.  Vital signs are within normal limits.  Saturating 100% on BiPAP therapy with 40% FiO2.  Results consistent with leukocytosis, normocytic anemia and radiographically worsening multifocal airspace disease.  eICU Interventions  Blood order was canceled, last hemoglobin was 7.9, no indication for blood at this time.  BiPAP at nighttime for comfort  Consider norepinephrine  as needed to maintain MAP greater than 65  Continuous bladder irrigation  DVT prophylaxis with SCDs given ongoing bleeding Prophylaxis not indicated, PPI in place     Intervention Category Evaluation Type: New Patient Evaluation  Daniele Dillow 11/28/2023, 11:15 PM

## 2023-11-28 NOTE — Progress Notes (Signed)
 Physical Therapy Treatment Patient Details Name: Kayla Schmitt MRN: [id number] DOB: July 03, 1965 Today's Date: 11/28/2023   History of Present Illness 58 y.o female admitted 7/15 from [facility] for SOB, CHF exacerbation. 7/17 attempted RHC but couldn't lie flat with pulmonary edema and decompensation requiring intubation. 7/19 CRRT. 7/20 extubation. PMHx: admission 6/22-7/1 for LLE pain, sepsis, necrotizing fasciitis. 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 CAD, aortic stenosis, HTN, CHF, PAD, bil transmet amputation, HLD, hypothyroidsim, T2DM.    PT Comments  Pt received in chair, more lethargic this date and reluctant to participate in transfer training due to c/o fatigue and severe DOE 4/4. MD present on PTA arrival and once therapist explained that she has been up in chair for almost 24 hours, pt agrees with therapist that pt needs to transfer back to bed for her safety and pressure relief, with pt agreeable only if hoyer lift utilized, pt defers Stedy or lateral scoot transfer back to bed. Pt needing up to CGA for lateral leans and reclined to long sitting posture due to pt lethargy/decreased overall awareness and internal distraction. PTA obtained mechanical lift pad from other unit but once returned to room, lab arriving to take urgent samples, PTA plan to return once pt available to complete transfer. Emphasis on BLE exercises, seated breathing exercises and pressure relief technique/frequency with handout printed for carryover after session. Patient will benefit from intensive inpatient follow-up therapy, >3 hours/day but if going home instead, recommend HHPT.    If plan is discharge home, recommend the following: A lot of help with walking and/or transfers;A lot of help with bathing/dressing/bathroom;Assistance with cooking/housework;Assist for transportation;Help with stairs or ramp for entrance;Other (comment) (some supervision due to waxing/waning cognition this date)   Can travel by private vehicle         Equipment Recommendations  None recommended by PT    Recommendations for Other Services       Precautions / Restrictions Precautions Precautions: Fall;Other (comment) Recall of Precautions/Restrictions: Impaired Precaution/Restrictions Comments: Contact precs; R transmet amputation, L BKA Required Braces or Orthoses: Other Brace Other Brace: Limb guard (benefits from encouragement to don/maintain) Restrictions Weight Bearing Restrictions Per Provider Order: Yes LLE Weight Bearing Per Provider Order: Non weight bearing Other Position/Activity Restrictions: Date of surgery 6/24     Mobility  Bed Mobility Overal bed mobility: Needs Assistance Bed Mobility: Supine to Sit     Supine to sit: Contact guard     General bed mobility comments: reclined posture to long sitting x5 reps while repositioning in chiar for placement of hoyer lift pad behind her and L/R leans to place lift pad under her hips. CGA for safety as pt with decreased overall awareness when leaning and wanting to avoid her bumping her head when she leans    Transfers Overall transfer level: Needs assistance                 General transfer comment: Visual/verbal discussion/demo for transfer options for patient to get from chair to bed, PTA offering to get her to chair via Stedy or slide board to bed from chair as this is how she did it before, but pt defers due to c/o dyspnea and weakness. After encouragement from PTA, pt agreeable to hoyer back to bed from chair as long as she can have HOB elevated to 90 degrees. Unfortunately, no lift pads were available so PTA left to obtain pad from another unit, and while pad was being obtained, lab came to room to draw blood x2 so defer to  return to bed until pt available after stat labs are drawn.    Ambulation/Gait                   Stairs             Wheelchair Mobility     Tilt Bed    Modified Rankin (Stroke Patients Only)       Balance  Overall balance assessment: Needs assistance Sitting-balance support: No upper extremity supported, Feet unsupported Sitting balance-Leahy Scale: Fair Sitting balance - Comments: pt tending to use BUE support today due to increased fatigue/dypsnea       Standing balance comment: pt refusing to attempt STS to standing scale today                            Communication Communication Communication: No apparent difficulties  Cognition Arousal: Lethargic (waxing/waning, pt tends to maintain eyes closed but verbally responds when cued.) Behavior During Therapy: Flat affect, Anxious   PT - Cognitive impairments: Attention, Safety/Judgement, Initiation, Awareness                       PT - Cognition Comments: Pt distracted this date due to c/o shortness of breath, pt also reports reluctance to get back to bed due to symptoms. MD present at beginning of session and agreeing with PTA that she needs to get back to bed as she stayed up in recliner for almost 24 hours since previous date and is at higher risk of pressure sore worsening with this posture. Pt agreeable after discussion but continues to perseverate on symptoms, even wtih reassurance that SpO2 is [facility] when she performs pursed-lip breathing and breathes slowly. Following commands: Impaired Following commands impaired: Only follows one step commands consistently, Follows multi-step commands inconsistently    Cueing Cueing Techniques: Verbal cues, Gestural cues  Exercises Amputee Exercises Hip Flexion/Marching: AROM, AAROM, Both, 5 reps, Seated (while repositioning/placing hoyer lift pad behind her) Knee Flexion: AROM, Left, 5 reps, Seated Knee Extension: AROM, Left, 5 reps, Seated Other Exercises Other Exercises: education provided regarding weight shifting/pressure relief, posture for better oxygenation while seated/not to lean forward excessively and hunch shoulders as this closes lungs up more, and pursed-lip  breathing x10 reps Other Exercises: L/R leans x3 reps while repositioning pad behind hips to either side    General Comments General comments (skin integrity, edema, etc.): purewick canister fluid appears dark reddish brown, RN/MD aware. Pt agreeable to LLE limb guard being placed prior to hoyer back to bed from chair.      Pertinent Vitals/Pain Pain Assessment Pain Assessment: PAINAD Breathing: noisy labored breathing, long periods of hyperventilation, Cheyne-Stokes respirations Negative Vocalization: occasional moan/groan, low speech, negative/disapproving quality Facial Expression: sad, frightened, frown Body Language: relaxed Consolability: distracted or reassured by voice/touch PAINAD Score: 5 Pain Location: generalized; pt with noted dark brownish red color to paper pad on her chair when she rolls for placement of hoyer lift pad; NT/RN notified and are alreay aware; pt with no specific c/o pain, just shortness of breath Pain Descriptors / Indicators: Grimacing    Home Living                          Prior Function            PT Goals (current goals can now be found in the care plan section) Acute Rehab PT Goals Patient Stated  Goal: To go home but not wear myself out too much PT Goal Formulation: Patient unable to participate in goal setting Time For Goal Achievement: 11/30/23 Progress towards PT goals: Not progressing toward goals - comment (per MD pt needs blood, anticipate pt mobility will improve once medical status improves)    Frequency    Min 2X/week      PT Plan      Co-evaluation              AM-PAC PT 6 Clicks Mobility   Outcome Measure  Help needed turning from your back to your side while in a flat bed without using bedrails?: None Help needed moving from lying on your back to sitting on the side of a flat bed without using bedrails?: None Help needed moving to and from a bed to a chair (including a wheelchair)?: A Lot Help needed  standing up from a chair using your arms (e.g., wheelchair or bedside chair)?: Total Help needed to walk in hospital room?: Total Help needed climbing 3-5 steps with a railing? : Total 6 Click Score: 13    End of Session Equipment Utilized During Treatment: Oxygen ;Other (comment) (LLE limb guard placed) Activity Tolerance: Patient limited by fatigue;Treatment limited secondary to medical complications (Comment);Other (comment) (dypsnea 3-4/4 and c/o fatigue limiting activity tolerance) Patient left: in chair;with call bell/phone within reach;with family/visitor present;Other (comment) (pt's mother present) Nurse Communication: Need for lift equipment;Mobility status;Precautions;Other (comment) (shortness of breath, O2 increased to 4L/min due to mild hypoxia and air hunger) PT Visit Diagnosis: Unsteadiness on feet (R26.81);Other abnormalities of gait and mobility (R26.89);Muscle weakness (generalized) (M62.81);Difficulty in walking, not elsewhere classified (R26.2)     Time: 8579-8564 PT Time Calculation (min) (ACUTE ONLY): 15 min  Charges:    $Therapeutic Exercise: 8-22 mins PT General Charges $$ ACUTE PT VISIT: 1 Visit                     Fain Francis P., PTA Acute Rehabilitation Services Secure Chat Preferred 9a-5:30pm Office: [phone]    Connell HERO [facility] 11/28/2023, 3:56 PM

## 2023-11-29 ENCOUNTER — Encounter (HOSPITAL_COMMUNITY)
Admission: EM | Disposition: A | Discharge status: Home Health | Payer: Self-pay | Source: Skilled Nursing Facility | Attending: Internal Medicine

## 2023-11-29 ENCOUNTER — Inpatient Hospital Stay (HOSPITAL_COMMUNITY)

## 2023-11-29 DIAGNOSIS — Z7189 Other specified counseling: Secondary | ICD-10-CM

## 2023-11-29 DIAGNOSIS — Z515 Encounter for palliative care: Secondary | ICD-10-CM

## 2023-11-29 HISTORY — PX: RIGHT HEART CATH: CATH118263

## 2023-11-29 LAB — POCT I-STAT EG7
Acid-Base Excess: 3 mmol/L — ABNORMAL HIGH (ref 0.0–2.0)
Acid-Base Excess: 3 mmol/L — ABNORMAL HIGH (ref 0.0–2.0)
Bicarbonate: 28 mmol/L (ref 20.0–28.0)
Bicarbonate: 28.1 mmol/L — ABNORMAL HIGH (ref 20.0–28.0)
Calcium, Ion: 1.22 mmol/L (ref 1.15–1.40)
Calcium, Ion: 1.23 mmol/L (ref 1.15–1.40)
HCT: 21 % — ABNORMAL LOW (ref 36.0–46.0)
HCT: 22 % — ABNORMAL LOW (ref 36.0–46.0)
Hemoglobin: 7.1 g/dL — ABNORMAL LOW (ref 12.0–15.0)
Hemoglobin: 7.5 g/dL — ABNORMAL LOW (ref 12.0–15.0)
O2 Saturation: 50 %
O2 Saturation: 55 %
Potassium: 4.7 mmol/L (ref 3.5–5.1)
Potassium: 4.8 mmol/L (ref 3.5–5.1)
Sodium: 135 mmol/L (ref 135–145)
Sodium: 135 mmol/L (ref 135–145)
TCO2: 29 mmol/L (ref 22–32)
TCO2: 29 mmol/L (ref 22–32)
pCO2, Ven: 43.2 mmHg — ABNORMAL LOW (ref 44–60)
pCO2, Ven: 43.3 mmHg — ABNORMAL LOW (ref 44–60)
pH, Ven: 7.418 (ref 7.25–7.43)
pH, Ven: 7.42 (ref 7.25–7.43)
pO2, Ven: 26 mmHg — CL (ref 32–45)
pO2, Ven: 28 mmHg — CL (ref 32–45)

## 2023-11-29 LAB — BASIC METABOLIC PANEL WITH GFR
Anion gap: 15 (ref 5–15)
BUN: 103 mg/dL — ABNORMAL HIGH (ref 6–20)
CO2: 24 mmol/L (ref 22–32)
Calcium: 9.5 mg/dL (ref 8.9–10.3)
Chloride: 97 mmol/L — ABNORMAL LOW (ref 98–111)
Creatinine, Ser: 2.43 mg/dL — ABNORMAL HIGH (ref 0.44–1.00)
GFR, Estimated: 23 mL/min — ABNORMAL LOW (ref >60)
Glucose, Bld: 211 mg/dL — ABNORMAL HIGH (ref 70–99)
Potassium: 4.6 mmol/L (ref 3.5–5.1)
Sodium: 136 mmol/L (ref 135–145)

## 2023-11-29 LAB — PROCALCITONIN: Procalcitonin: 0.88 ng/mL

## 2023-11-29 LAB — BRAIN NATRIURETIC PEPTIDE: B Natriuretic Peptide: 2406.9 pg/mL — ABNORMAL HIGH (ref 0.0–100.0)

## 2023-11-29 LAB — CBC WITH DIFFERENTIAL/PLATELET
Abs Immature Granulocytes: 1.01 K/uL — ABNORMAL HIGH (ref 0.00–0.07)
Basophils Absolute: 0.1 K/uL (ref 0.0–0.1)
Basophils Relative: 1 %
Eosinophils Absolute: 0.3 K/uL (ref 0.0–0.5)
Eosinophils Relative: 2 %
HCT: 23.8 % — ABNORMAL LOW (ref 36.0–46.0)
Hemoglobin: 7.5 g/dL — ABNORMAL LOW (ref 12.0–15.0)
Immature Granulocytes: 5 %
Lymphocytes Relative: 16 %
Lymphs Abs: 3.3 K/uL (ref 0.7–4.0)
MCH: 29.2 pg (ref 26.0–34.0)
MCHC: 31.5 g/dL (ref 30.0–36.0)
MCV: 92.6 fL (ref 80.0–100.0)
Monocytes Absolute: 1.8 K/uL — ABNORMAL HIGH (ref 0.1–1.0)
Monocytes Relative: 8 %
Neutro Abs: 14.4 K/uL — ABNORMAL HIGH (ref 1.7–7.7)
Neutrophils Relative %: 68 %
Platelets: 271 K/uL (ref 150–400)
RBC: 2.57 MIL/uL — ABNORMAL LOW (ref 3.87–5.11)
RDW: 20.4 % — ABNORMAL HIGH (ref 11.5–15.5)
WBC: 21 K/uL — ABNORMAL HIGH (ref 4.0–10.5)
nRBC: 0.8 % — ABNORMAL HIGH (ref 0.0–0.2)

## 2023-11-29 LAB — GLUCOSE, CAPILLARY
Glucose-Capillary: 173 mg/dL — ABNORMAL HIGH (ref 70–99)
Glucose-Capillary: 193 mg/dL — ABNORMAL HIGH (ref 70–99)
Glucose-Capillary: 169 mg/dL — ABNORMAL HIGH (ref 70–99)
Glucose-Capillary: 228 mg/dL — ABNORMAL HIGH (ref 70–99)

## 2023-11-29 LAB — T4, FREE: Free T4: 0.89 ng/dL (ref 0.61–1.12)

## 2023-11-29 LAB — MAGNESIUM: Magnesium: 2 mg/dL (ref 1.7–2.4)

## 2023-11-29 LAB — TSH: TSH: 16.701 u[IU]/mL — ABNORMAL HIGH (ref 0.350–4.500)

## 2023-11-29 SURGERY — RIGHT HEART CATH
Anesthesia: LOCAL

## 2023-11-29 MED ORDER — MIDODRINE HCL 5 MG PO TABS: 10.0000 mg | ORAL_TABLET | Freq: Three times a day (TID) | ORAL | Status: DC

## 2023-11-29 MED ORDER — SODIUM CHLORIDE 0.9 % IV SOLN
2.0000 g | INTRAVENOUS | Status: AC
  Administered 2023-11-29 – 2023-12-03 (×5): 2 g via INTRAVENOUS
  Filled 2023-11-29 (×5): qty 20

## 2023-11-29 MED ORDER — HEPARIN (PORCINE) IN NACL 1000-0.9 UT/500ML-% IV SOLN
INTRAVENOUS | Status: DC | PRN
  Administered 2023-11-29: 500 mL

## 2023-11-29 MED ORDER — MIDODRINE HCL 5 MG PO TABS: 5.0000 mg | ORAL_TABLET | Freq: Three times a day (TID) | ORAL | Status: DC

## 2023-11-29 MED ORDER — LIDOCAINE HCL (PF) 1 % IJ SOLN
INTRAMUSCULAR | Status: AC
  Filled 2023-11-29: qty 30

## 2023-11-29 MED ORDER — FUROSEMIDE 10 MG/ML IJ SOLN
120.0000 mg | Freq: Two times a day (BID) | INTRAVENOUS | Status: DC
  Administered 2023-11-29 – 2023-12-03 (×8): 120 mg via INTRAVENOUS
  Filled 2023-11-29 (×2): qty 12
  Filled 2023-11-29 (×2): qty 120
  Filled 2023-11-29: qty 10
  Filled 2023-11-29 (×2): qty 12
  Filled 2023-11-29: qty 120
  Filled 2023-11-29 (×3): qty 10

## 2023-11-29 MED ORDER — SODIUM CHLORIDE 0.9% FLUSH
3.0000 mL | Freq: Two times a day (BID) | INTRAVENOUS | Status: DC
  Administered 2023-11-29: 3 mL via INTRAVENOUS

## 2023-11-29 MED ORDER — SODIUM CHLORIDE 0.9 % IV SOLN: 250.0000 mL | INTRAVENOUS | Status: DC | PRN

## 2023-11-29 MED ORDER — LIDOCAINE HCL (PF) 1 % IJ SOLN
INTRAMUSCULAR | Status: DC | PRN
  Administered 2023-11-29: 2 mL

## 2023-11-29 MED ORDER — ASPIRIN 81 MG PO CHEW: 81.0000 mg | CHEWABLE_TABLET | ORAL | Status: DC

## 2023-11-29 MED ORDER — MIDODRINE HCL 5 MG PO TABS
5.0000 mg | ORAL_TABLET | Freq: Three times a day (TID) | ORAL | Status: DC
  Administered 2023-11-29 – 2023-12-06 (×20): 5 mg via ORAL
  Filled 2023-11-29 (×20): qty 1

## 2023-11-29 MED ORDER — SODIUM CHLORIDE 0.9% FLUSH: 3.0000 mL | INTRAVENOUS | Status: DC | PRN

## 2023-11-29 SURGICAL SUPPLY — 5 items
CATH BALLN WEDGE 5F 110CM (CATHETERS) IMPLANT
PACK CARDIAC CATHETERIZATION (CUSTOM PROCEDURE TRAY) ×2 IMPLANT
SHEATH GLIDE SLENDER 4/5FR (SHEATH) IMPLANT
TRANSDUCER W/STOPCOCK (MISCELLANEOUS) IMPLANT
TUBING ART PRESS 72 MALE/FEM (TUBING) IMPLANT

## 2023-11-29 NOTE — Progress Notes (Signed)
 Entered patient's room for nurse to nurse at 1930. Patient appeared pale, at the edge of the bed in tripod position, in respiratory distress. Oxygen  saturations were in high 90s but RR mid 20-30s. She stated she was short of breath and could not lay down. Hypotensive with systolics in 80s and HR 100.  Patient is stating please help me. I just can't breath. MEWS yellow.  RT called to ask if they can come to room, might have further suggestions to help. MD paged, and rapid response Deatrice, RN contacted, to come lay eyes on the patient and give expertise. Dr. Franky at bedside shortly after 2020 to see patient. STAT CXR ordered and CCM consult.

## 2023-11-29 NOTE — Interval H&P Note (Signed)
 History and Physical Interval Note:  11/29/2023 10:42 AM  Kayla Schmitt  has presented today for surgery, with the diagnosis of heart failure.  The various methods of treatment have been discussed with the patient and family. After consideration of risks, benefits and other options for treatment, the patient has consented to  Procedure(s): RIGHT HEART CATH (N/A) as a surgical intervention.  The patient's history has been reviewed, patient examined, no change in status, stable for surgery.  I have reviewed the patient's chart and labs.  Questions were answered to the patient's satisfaction.     Francina Beery [employer]

## 2023-11-29 NOTE — Progress Notes (Signed)
 Patient ID: Kayla Schmitt, female   DOB: [birth year], 58 y.o.   MRN: [id number] [facility] Progress Note   Assessment/ Plan:   1. Acute kidney Injury on chronic kidney disease stage III (baseline creatinine appears 1.3-1.6): Hemodynamically mediated in the setting of acute exacerbation of congestive heart failure, transiently on CRRT that was d/c on 7/22.  Had been transitioned from IV furosemide  80 mg to torsemide  80 mg twice daily on 7/27 (AM dose only on 7/28) but will fortunately became acutely short of breath on 7/31 with not surprisingly worsening renal function.  Cumulative 4.9 L net negative during this hospitalization. UOP dropping but was not all measured but now has a foley with hematuria.  She is scheduled for a RHC today  Moving towards needing CRRT in the near future; will need a temporary dialysis catheter.  2.  Acute exacerbation of congestive heart failure/cardiogenic shock: History of congestive heart failure with reduced ejection fraction and decompensation noted during this hospitalization.  Refractory to diuretics in the setting of inotropic/pressor support and started on CRRT transiently for extracorporeal volume unloading.  Urine output decreasing trend with hypotension as well  3.  Hyponatremia: Hypervolemic hyponatremia in the setting of acute kidney injury/CHF exacerbation, monitor with diuresis and renal recovery. 4.  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  Continue antibiotics per primary service. 5.  Anemia: Secondary to critical illness and prior history of GI bleed.  PRBC transfusion done yesterday with stable H/H overnight. 6. UTI: started on Abx on 7/30 with hematuria as well  Subjective:   Shortness of breath worsening acutely yesterday, still short of breath but states that it is better than yesterday   Mother  bedside updated as well   Objective:   BP (!)  82/59 (BP Location: Right Arm)   Pulse 98   Temp 97.9 F (36.6 C) (Axillary)   Resp (!) 25   Ht 5' 4 (1.626 m)   Wt 85.3 kg   SpO2 99%   BMI 32.28 kg/m   Intake/Output Summary (Last 24 hours) at 11/29/2023 0830 Last data filed at 11/29/2023 0800 Gross per 24 hour  Intake 130 ml  Output 350 ml  Net -220 ml   Weight change:   Physical Exam: Gen: resting in bed CVS: Pulse regular rhythm, normal rate S1 and S2 normal Resp: No audible rales/rhonchi at this time, poor inspiratory effort Abd: Soft, obese, nontender, bowel sounds normal Ext: Status post left BKA and right forefoot amputation.  Trace edema over dependent areas..  Imaging: US  RENAL Result Date: 11/29/2023 CLINICAL DATA:  58 year old female with congestive heart failure. EXAM: RENAL / URINARY TRACT ULTRASOUND COMPLETE COMPARISON:  CT Abdomen and Pelvis 03/22/2023. FINDINGS: Right Kidney: Renal measurements: 10.8 x 4.4 x 4.2 cm = volume: 104 mL. No hydronephrosis or right renal mass. Cortical echogenicity is normal. Left Kidney: Renal measurements: 10.2 x 5.1 x 3.8 cm = volume: 102 mL. No left hydronephrosis or renal mass. Normal left renal cortical echogenicity. Bladder: Foley catheter balloon and irregular echogenic debris visible in the urinary bladder (image 49). Other: None. IMPRESSION: 1. Negative ultrasound appearance of both kidneys. 2. Foley catheter in place with irregular echogenic debris within the bladder. Correlate with urinalysis. Electronically Signed   By: VEAR Hurst M.D.   On: 11/29/2023 06:28   DG CHEST PORT 1 VIEW Result Date: 11/28/2023 CLINICAL DATA:  Shortness of breath EXAM: PORTABLE CHEST 1 VIEW COMPARISON:  Chest  x-ray 11/28/2023 FINDINGS: Diffuse patchy multifocal airspace disease throughout both lungs has significantly increased. This spares the lung apices. There is no pleural effusion or pneumothorax. The cardiomediastinal silhouette is within normal limits. No acute fractures are seen. IMPRESSION:  Increasing diffuse patchy multifocal airspace disease throughout both lungs. Electronically Signed   By: Greig Pique M.D.   On: 11/28/2023 21:25   DG CHEST PORT 1 VIEW Result Date: 11/28/2023 CLINICAL DATA:  [id number] Dyspnea [id number] EXAM: PORTABLE CHEST - 1 VIEW COMPARISON:  Multiple, most recently November 17, 2023 FINDINGS: Interval removal of the endotracheal and esophagogastric tubes and the central venous catheters. Diffuse hazy airspace disease throughout both lungs. Small right pleural effusion. No pneumothorax. Decreased consolidation in the left lung. Mild cardiomegaly. No acute fracture or destructive lesion. IMPRESSION: 1. Similar diffuse hazy airspace disease throughout both lungs with decreasing consolidation in the left lung. Trace right pleural effusion. 2. Interval removal of the support tubes and lines. Electronically Signed   By: Rogelia Myers M.D.   On: 11/28/2023 12:57     Labs: BMET Recent Labs  Lab 11/23/23 0510 11/24/23 0841 11/25/23 0209 11/26/23 0256 11/27/23 0235 11/28/23 0545 11/29/23 0230  NA 133* 132* 134* 135 134* 136 136  K 3.1* 3.8 4.0 3.5 3.6 3.5 4.6  CL 97* 96* 97* 96* 96* 96* 97*  CO2 25 22 25 25 26 26 24   GLUCOSE 120* 201* 154* 148* 166* 153* 211*  BUN 71* 79* 91* 93* 93* 92* 103*  CREATININE 2.88* 2.75* 2.31* 2.10* 2.06* 1.99* 2.43*  CALCIUM  8.4* 8.8* 8.7* 8.8* 9.0 9.2 9.5  PHOS 4.7* 4.2 3.4 3.1 2.6  --   --    CBC Recent Labs  Lab 11/27/23 1342 11/28/23 0545 11/28/23 1501 11/29/23 0230  WBC 18.9* 18.2* 29.2* 21.0*  NEUTROABS  --   --  23.7* 14.4*  HGB 7.9* 7.2* 7.9* 7.5*  HCT 24.3* 22.5* 25.1* 23.8*  MCV 90.7 90.7 90.6 92.6  PLT 233 243 361 271    Medications:     sodium chloride    Intravenous Once   atorvastatin   40 mg Oral Daily   bethanechol   10 mg Oral TID   Chlorhexidine  Gluconate Cloth  6 each Topical Daily   feeding supplement  237 mL Oral Q1400   ferrous sulfate   325 mg Oral Q breakfast   gabapentin   300 mg Oral QHS    hydrocortisone  cream   Topical TID   insulin  aspart  0-15 Units Subcutaneous TID WC   insulin  glargine-yfgn  6 Units Subcutaneous BID   levothyroxine   150 mcg Oral Q0600   midodrine   5 mg Oral Q8H   multivitamin with minerals  1 tablet Oral Daily   nutrition supplement (JUVEN)  1 packet Oral BID AC & HS   pantoprazole   40 mg Oral Daily   sodium chloride  flush  10-40 mL Intracatheter Q12H

## 2023-11-29 NOTE — Progress Notes (Signed)
 BiPAP patient transported from 2H25 to Cath Lab and back without any issues.

## 2023-11-29 NOTE — Progress Notes (Signed)
 Indwelling three-way Foley in place with CBI but CBI is stopped and on hold. There is very little drainage in Foley tubing; however, there is pink drainage on the bed that is leaking from the urethra around the Foley tube.   Spoke briefly with urology on the phone re suggestions. Bladder scan shows 312. Foley irrigated with 60 cc sterile water with immediate pull-back, per urology suggestion. Irrigation went in smoothly with no resistance; however, when pulling back there is resistance and only air returned.   Three way Foley removed. New three-way inserted with small amount of bright red blood returned. Irrigation as above attempted again with same results as above. CBI remains on hold.

## 2023-11-29 NOTE — H&P (View-Only) (Signed)
 Patient ID: Kayla Schmitt, female   DOB: 1965/10/27, 58 y.o.   MRN: [id number]     Advanced Heart Failure Rounding Note  Cardiologist: Vishnu SHAUNNA Maywood, MD  AHF Consulting MD: Dr. Cherrie Chief Complaint: Acute on chronic HFpEF / RV Failure Patient Profile   58 y.o. female with history of PAD with prior SFA stents and bilateral TMA, severe 3v CAD, MR/AS, DM II, anemia, HTN, HLD, hypothyroidism.  Admitted in 6/25 with MRSA bactermia and necrotizing fasciitis s/p left BKA. Transferred back to [facility] from rehab d/t acute on chronic CHF with concern for low-output and acute respiratory failure.  Subjective:    7/18: aborted cath for acute respiratory distress; intubated; DBA started 7/19: started on CRRT 7/20: extubated 7/21: hgb 6.5 s/p 1u RBCs 7/23: CRRT stopped  Patient became more short of breath yesterday afternoon.  SBP 80s-90s (has been in this range).  She was moved back to ICU and give 1 dose of bumetanide  1 mg IV.  BUN/creatinine higher at 103/2.43.   Hgb 8.1>7.3>7.2>7.5, ongoing hematuria.  Started on CBI but this was stopped as unsuccessful.  Still with hematuria this morning.    WBCs 21K today, she is on ceftriaxone  for UTI.   She is still short of breath with orthopnea.  She has received torsemide  80 mg this morning.   Objective:    Weight Range: 85.3 kg Body mass index is 32.28 kg/m.   Vital Signs:   Temp:  [96.4 F (35.8 C)-98.4 F (36.9 C)] 97.9 F (36.6 C) (08/01 0300) Pulse Rate:  [89-107] 98 (08/01 0800) Resp:  [16-38] 25 (08/01 0800) BP: (82-109)/(55-72) 82/59 (08/01 0800) SpO2:  [87 %-100 %] 99 % (08/01 0800) FiO2 (%):  [40 %] 40 % (08/01 0407) Weight:  [85.3 kg] 85.3 kg (08/01 0500) Last BM Date : 11/28/23  Weight change: Filed Weights   11/26/23 0500 11/27/23 0437 11/29/23 0500  Weight: 86.7 kg 84.4 kg 85.3 kg   Intake/Output:  Intake/Output Summary (Last 24 hours) at 11/29/2023 0814 Last data filed at 11/28/2023 2254 Gross per 24 hour   Intake 80 ml  Output 350 ml  Net -270 ml    Physical Exam   General: NAD Neck: JVP difficult, suspect elevated, no thyromegaly or thyroid nodule.  Lungs: Clear to auscultation bilaterally with normal respiratory effort. CV: Nondisplaced PMI.  Heart regular S1/S2, no S3/S4, no murmur.  1+ edema to knee on right.   Abdomen: Soft, nontender, no hepatosplenomegaly, no distention.  Skin: Intact without lesions or rashes.  Neurologic: Alert and oriented x 3.  Psych: Normal affect. Extremities: No clubbing or cyanosis. Right TMA, left BKA.  HEENT: Normal.   Telemetry   NSR 90s with PVCs (personally reviewed)  Labs    CBC Recent Labs    11/28/23 1501 11/29/23 0230  WBC 29.2* 21.0*  NEUTROABS 23.7* 14.4*  HGB 7.9* 7.5*  HCT 25.1* 23.8*  MCV 90.6 92.6  PLT 361 271   Basic Metabolic Panel Recent Labs    92/69/74 0235 11/28/23 0545 11/29/23 0230  NA 134* 136 136  K 3.6 3.5 4.6  CL 96* 96* 97*  CO2 26 26 24   GLUCOSE 166* 153* 211*  BUN 93* 92* 103*  CREATININE 2.06* 1.99* 2.43*  CALCIUM  9.0 9.2 9.5  MG 2.0  --  2.0  PHOS 2.6  --   --    Liver Function Tests Recent Labs    11/27/23 0235  ALBUMIN  2.8*   BNP (last 3 results) Recent Labs  11/07/23 1457 11/13/23 0521 11/29/23 0230  BNP 336.4* 1,626.5* 2,406.9*   Medications:    Scheduled Medications:  sodium chloride    Intravenous Once   atorvastatin   40 mg Oral Daily   bethanechol   10 mg Oral TID   Chlorhexidine  Gluconate Cloth  6 each Topical Daily   docusate sodium   100 mg Oral BID   feeding supplement  237 mL Oral Q1400   ferrous sulfate   325 mg Oral Q breakfast   gabapentin   300 mg Oral QHS   hydrocortisone  cream   Topical TID   insulin  aspart  0-15 Units Subcutaneous TID WC   insulin  glargine-yfgn  6 Units Subcutaneous BID   levothyroxine   150 mcg Oral Q0600   midodrine   10 mg Oral Q8H   midodrine   5 mg Oral TID WC   multivitamin with minerals  1 tablet Oral Daily   nutrition supplement  (JUVEN)  1 packet Oral BID AC & HS   pantoprazole   40 mg Oral Daily   sodium chloride  flush  10-40 mL Intracatheter Q12H   torsemide   80 mg Oral BID   Infusions:  cefTRIAXone  (ROCEPHIN )  IV     sodium chloride  irrigation      PRN Medications: acetaminophen , Gerhardt's butt cream, ipratropium-albuterol , lidocaine , mouth rinse, mouth rinse, oxyCODONE , polyethylene glycol, prochlorperazine , senna-docusate, sodium chloride  flush  Assessment/Plan  Acute on chronic HFpEF>Mixed Shock; Cardiogenic/Septic - TEE 6/25: EF 60-65%, RV okay, mild to mod MR, moderate-severe AS with mG 27 mmHg and AVA 1.07 cm2 - TTE 11/15/23 LVEF 25% - Taken for [facility] 7/17, however developed flash pulmonary edema. Unable to lie flat for procedure.  - Repeat echo with EF 25-30%, G2DD, mild to mod MR, mod-severe AS  - GDMT limited by renal function and hypotension (SBP 80s-90s generally).  - More short of breath yesterday pm with CXR showing pulmonary edema, had IV bumetanide  last night but got po torsemide  already this morning.  - Will start midodrine  5 mg tid, can titrate up as needed.  - Will take for RHC this morning for formal assessment of filling pressures, will likely need to start IV Lasix  but will not give a dose yet as she just got torsemide . Discussed risks/benefits of cath with patient, she agrees to procedure.   2. Acute respiratory failure with hypoxia - CXR 7/20 with b/l diffuse airspace opacities, likely pulmonary edema, though given fevers also concern for PNA - CXR 7/31 consistent with pulmonary edema, currently on nasal cannula.     3. CAD - NSTEMI in 4/24. Multivessel CAD. CABG had been recommended but managed medically due to other active medical issues - Further ischemic eval pending renal function => no cath with AKI.  - continue aspirin  + statin   4. Valvular heart disease - Mild to moderate MR and moderate to severe AS on echo 6/25 - Repeat echo 7/25 with extremely poor windows, based on exam  expect AS moderate-severe - Echo 11/20/23 with Vmax 3.21, DVT 0.26, mod to severe AS, mG 22 - She is not a TAVR or SAVR candidate currently so even if she has severe AS it would not change current management   5. MRSA bacteremia - Necrotizing fasciitis - S/p recent left BKA 6/25; S/p TMA in 4/24  6. UTI - WBCs 21K today, on ceftriaxone  for UTI.   7. PAD - hx SFA stents - On aspirin  + statin. Had been on plavix  but stopped d/t risk of bleeding.   8. Anemia - s/p 1 u RBC  7/14 - Hx GI bleed from AVMs.  - fecal occult negative - Now with hematuria, failed CBI.  - hgb 6.5>1u RBC>7.4>6.9>1u RBC>8.4>8.1>7.3>7.2>7.5 - Transfuse hgb < 7.    9. AKI on CKD IIIa - In setting of volume overload/low-output.  - Started on CRRT 7/19, stopped 7/22.  - Some renal recovery and creatinine had been trending down, but BUN/creatinine now trending back up and developing volume overload.  - As above, RHC today.  Will likely need resumption of IV Lasix  and if this fails/creatinine continues to rise, may need to resume RRT.  Nephrology following.   10. Hematuria - Has UTI by UA but now ongoing hematuria.  - Ceftriaxone  as above.  - Attempted CBI but this failed.  - Needs urology evaluation.   CRITICAL CARE Performed by: Ezra Shuck  Total critical care time: 40 minutes  Critical care time was exclusive of separately billable procedures and treating other patients.  Critical care was necessary to treat or prevent imminent or life-threatening deterioration.  Critical care was time spent personally by me on the following activities: development of treatment plan with patient and/or surrogate as well as nursing, discussions with consultants, evaluation of patient's response to treatment, examination of patient, obtaining history from patient or surrogate, ordering and performing treatments and interventions, ordering and review of laboratory studies, ordering and review of radiographic studies, pulse  oximetry and re-evaluation of patient's condition.   Ezra Shuck 11/29/2023 8:14 AM

## 2023-11-29 NOTE — Consult Note (Signed)
 Consultation Note Date: 11/29/2023   Patient Name: Kayla Schmitt  DOB: 09-16-1965  MRN: [id number]  Age / Sex: 58 y.o., female  PCP: [name], NP Referring Physician: Harold Scholz, MD  Reason for Consultation: Establishing goals of care  HPI/Patient Profile: 58 y.o. female  with past medical history of ischemic cardiomyopathy (CAD, EF 25%, G2DD), moderate-severe aortic stenosis, mild-moderate MR, HTN, PAD, dyslipidemia, hypothyroidism, DM-2 (HbA1c 5.5%), CKD-3a, GERD, Fe def anemia, admitted on 11/12/2023 with shortness of breath.  She had recently been hospitalized for necrotizing fasciitis requiring left BKA and was discharged to CIR for therapy.   She completed therapy 7/15 but then developed shortness of breath and had to be readmitted. Found to be in volume overload and with elevated WBCs. Difficulty maintaining fluid balance due to hypotension and renal function. Required CRRT from 7/19-7/22 for AKI. Intubated from 7/17-7/20. Attempted RHC 7/17 but unable to tolerate. Cards consulted and they note she has advanced HF with poor candidacy for advanced therapies. She also has declining hemoglobin with most recent 08/01 being 7.5 g/dl. WBCs elevated at 21 (down from 29.2 on 7/31, but WBCs were normal on 07/28 with rapid increase). On Rocephin  for UTI as of 7/30. BNP also significantly elevated at 2406.9. Underwent RHC today and was able to tolerate.  Today, patient states she just got back from her heart cath. She states she tolerated it well. She states she is feeling ok. She continues to have some SOB most notably orthopnea. She denies any pain.   PMT has been consulted to assist with goals of care conversation. Patient consents to discussion.   Clinical Assessment and Goals of Care:  I have reviewed medical records including EPIC notes, labs (independently reviewed) and imaging, also reviewed outside records from [facility] and prior  hospitalizations, assessed the patient and then with permission, discussed diagnosis prognosis, GOC, EOL wishes, disposition and options.  I introduced Palliative Medicine as specialized medical care for people living with serious illness. It focuses on providing relief from the symptoms and stress of a serious illness. The goal is to improve quality of life for both the patient and the family.  We discussed a brief life review of the patient and then focused on their current illness.   I attempted to elicit values and goals of care important to the patient.    Medical History Review and Family/Patient Understanding:   Patient has a fair understanding of her health condition. She is aware that she has had several notable declines in her health over the past month or so. She understands that she is experiencing problems with both her kidneys and her heart. Provided education about our concern regarding her advanced heart failure and managing it in the context of her CKD. Shared that while we are hopeful she will improve and stabilize that we are worried that her health could worsen and could do so rapidly. She states she understands.   Social History:  Patient was born and raised in [place]. She does not have any children. She and her mother are close and she also has several cousins she is close to. Her mother and cousins do live close by.   Functional and Nutritional State:  Before she developed the infection in her leg in June she was completely independent and working 50 hrs a week. She did not require any assistance. She was able to cook for herself and enjoyed cooking and had no nutritional concerns.   Palliative Symptoms:  Shortness  of breath Denies pain  Advance Directives:  A detailed discussion regarding advanced directives was had. See below.   Code Status:  Full code, time limited trial of life support.  Discussion:  Patient states her goal is to get better, get a  prosthesis, and get back home and back to work. She understands that she is quite ill. She is willing to go through invasive procedures at this point if it will save her life. We discussed her experience with CRRT. She states that she tolerated it fine and would consider proceeding with CRRT. We discussed trade offs. She states that she would be willing to undergo CPR and time limited trial of life support but would not want long term life support and states that if her likelihood of meaningful recovery was poor she would want life support discontinued. She is hopeful that with further treatments she will get better. She states her mother is her health surrogate if she were ever unable to speak for herself. She states her mother is aware she is her health surrogate. She is agreeable to talking to her mom more about her goals and wishes if her health were to decline.   Discussed the importance of continued conversation with family and the medical providers regarding overall plan of care and treatment options, ensuring decisions are within the context of the patient's values and GOCs.   Patient is agreeable for PMT to continue to follow and further discuss GOC as she progresses. Will reattempt visit later when hopefully mother is at bedside to ensure that we are all on the same page. Otherwise, PMT to follow up early next week for furthers discussion.   Questions and concerns were addressed.  PMT will continue to support holistically.   PATIENT, mother if unable to speak for herself.   Follow up:  I was able to follow back up with the patient and her mother in afternoon.  I spoke at length with patient and her mother and updated her mom that while we are hopeful patient will improve and be able to safely discharge from the hospital, we are concerned that with her acute on chronic heart failure with limited treatment options especially in the context of her acute on chronic kidney disease we are concerned  that her health could worsen and could worsen quickly.  I summarized my conversation with the patient to her mother and the patient confirmed I had accurately summarized her goals and wishes.  Let her mother know that she would be health surrogate if patient was unable to speak for herself.  Made mother aware that her goal is to improve and ultimately return home (this is also her mother's goal).  Let her know that patient would be willing to undergo a short trial of CPR and life support but that if prognosis was poor or if she required long-term life support that her daughter would not want this and would want life support discontinued and transition to comfort care.  Patient's mother states she understands this and is what she would want for herself.  She states if she it comes to this she will try to make the right decision for her daughter and we will honor her wishes.    SUMMARY OF RECOMMENDATIONS    Full code, time limited trial of life support Time for outcomes Continue to attempt to optimize volume status PMT to continue to follow and continue GOC discussions   Code Status/Advance Care Planning: Full code   Symptom Management:  Continue Tylenol  PRN Continue Gabapentin  nightly Continue Oxycodone  PRN Continue Miralax /Senna PRN for constipation Diuresis per cards to help with shortness of breath  Prognosis:  Guarded  Discharge Planning: To Be Determined      Primary Diagnoses: Present on Admission:  Acute on chronic systolic CHF (congestive heart failure) (HCC)  CAD (coronary artery disease)  GERD (gastroesophageal reflux disease)  Hypertension  Hypothyroidism  Iron  deficiency anemia    Physical Exam  Vital Signs: BP 105/67   Pulse 99   Temp 97.9 F (36.6 C) (Axillary)   Resp 15   Ht 5' 4 (1.626 m)   Wt 85.3 kg   SpO2 97%   BMI 32.28 kg/m  Pain Scale: 0-10 POSS *See Group Information*: 1-Acceptable,Awake and alert Pain Score: Asleep   SpO2: SpO2: 97  % O2 Device:SpO2: 97 % O2 Flow Rate: .O2 Flow Rate (L/min): 8 L/min   Palliative Assessment/Data: 50%    Total time: I spent 100 minutes in the care of the patient today in the above activities and documenting the encounter.   Laymon CHRISTELLA Pinal, NP  Palliative Medicine Team Team phone # [phone]  Thank you for allowing the Palliative Medicine Team to assist in the care of this patient. Please utilize secure chat with additional questions, if there is no response within 30 minutes please call the above phone number.  Palliative Medicine Team providers are available by phone from 7am to 7pm daily and can be reached through the team cell phone.  Should this patient require assistance outside of these hours, please call the patient's attending physician.

## 2023-11-29 NOTE — Progress Notes (Signed)
 Foley leaking majorly around insertion site. This RN received in report that bag for CBI had been clamped. This RN attempted to irrigate to prevent clots. Foley continues to leak. Assessed foley balloon - took some cc of saline out and put some cc of saline back in - foley still leaking.  Text paged urology around 1445 to see about them coming to assess foley to see if there was anything more to do to prevent leaking.  No callback received. CCM aware.   Difficult to assess true I/Os due to this foley issue.  RN will continue to monitor patient closely.

## 2023-11-29 NOTE — Progress Notes (Signed)
 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 Failed RHC 7/17 due to orthopnea. Developed gross hematuria and foley cath bladder irrigation. Hospitalist called PCCM for worsening hypoxia (3 L [place] to 6 L [place]), MAP 58 mmHg, and worsening SOB.   Pertinent  Medical History  Ischemic cardiomyopathy (CAD, EF 25%, G2DD), moderate-severe aortic stenosis, mild-moderate MR, HTN, PAD, dyslipidemia, hypothyroidism, DM-2, CKD-3a, GERD, Fe def anemia, nec fasc s/p left BKA  Significant Hospital Events: Including procedures, antibiotic start and stop dates in addition to other pertinent events   7/16 admission 7/17 ICU, intubated 7/18 started spiking fever with Tmax 101.5, tachycardic.  X-ray chest still showing bilateral infiltrate but FiO2 is coming down.  Lasix  infusion increased to 30 mg/h 7/19 started on CRRT, tolerating spontaneous breathing trial 7/20 extubated 7/22 CRRT holiday, Levophed /dobutamine  7/23 lasix  challenge, transfused 1u PRBC, remains low dose NE, DBA 2.5, CVP 7 7/24 laisx, off DBA 7/25: PCCM signed off 7/31: PCCM was called for eval. PCXR: worse pulm edema and bilateral pleural effusions. Negative 1.6 L fluid balance in the last 3 days  Interim History / Subjective:  Patient was transferred to ICU overnight,  stated breathing is much better than  yesterday Denies abdominal pain or other complaints  Objective    Blood pressure (!) 88/59, pulse 98, temperature 97.9 F (36.6 C), temperature source Axillary, resp. rate (!) 23, height 5' 4 (1.626 m), weight 85.3 kg, SpO2 97%.    FiO2 (%):  [40 %] 40 % PEEP:  [5 cmH20] 5 cmH20   Intake/Output Summary (Last 24 hours) at 11/29/2023 0800 Last data filed at 11/28/2023 2254 Gross per 24 hour  Intake 80 ml  Output 350 ml  Net -270 ml   Filed Weights   11/26/23 0500 11/27/23 0437 11/29/23 0500  Weight: 86.7 kg 84.4 kg 85.3 kg    Examination: General: Acute on chronically ill-appearing female, lying on the bed HEENT: [place]/AT, eyes anicteric.  moist mucus membranes Neuro: Alert, awake following commands Chest: Bilateral basal crackles,, no wheezes or rhonchi Heart: Regular rate and rhythm, no murmurs or gallops Abdomen: Soft, nontender, nondistended, bowel sounds present GU: Foley catheter in place with urine dark red in color Extremities: Status post bilateral BKA  Labs and images reviewed  Patient Lines/Drains/Airways Status     Active Line/Drains/Airways     Name Placement date Placement time Site Days   Peripheral IV 11/21/23 22 G 1.75 Anterior;Right Forearm 11/21/23  0422  Forearm  8   Urethral Catheter Harlene Quant, RN Triple-lumen 18 Fr. 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 Moderate-severe aortic stenosis, mild-moderate MR Multivessel coronary artery disease Patient is on heated high flow nasal cannula oxygen  Titrated off BiPAP Currently on 5 L, with O2 sat in mid  90s Continue IV diuresis with Lasix  Continue titrate nasal cannula oxygen  Advanced heart failure team is following She is not a candidate for TAVR GDMT is limited by borderline hypotension Continue midodrine  Patient will need CABG and SAVR once clinically stable  Hypothyroidism Continue levothyroxine   DM-2 (HbA1c 5.5%) Patient blood sugars are controlled Continue sliding scale and Lantus , closely monitor fingerstick with goal 140-180  AKI on CKD-3a due to cardiorenal syndrome Hematuria, probably traumatic Serum creatinine is uptrending Nephrology is following She required CRRT for few days Continue bladder irrigation  Acute urinary tract infection Patient complaining of dysuria day before yesterday Her UA showed many bacteria's Continue ceftriaxone  to complete 5 days therapy Repeat UA  Anemia of chronic disease Patient's iron  studies are not consistent with iron  deficiency Monitor H&H and transfuse if less than 7    Best Practice (right click and Reselect all SmartList Selections daily)   Diet/type: Start regular diet DVT prophylaxis SCD Pressure ulcer(s): Please see nursing notes GI prophylaxis: H2B Lines: N/A Foley:  Yes, and it is still needed Code Status:  full code Last date of multidisciplinary goals of care discussion [8/1: Patient and her mother both were updated at bedside, decision was to continue full scope of care]  Labs   CBC: Recent Labs  Lab 11/27/23 0235 11/27/23 1122 11/27/23 1342 11/28/23 0545 11/28/23 1501 11/29/23 0230  WBC 16.6*  --  18.9* 18.2* 29.2* 21.0*  NEUTROABS  --   --   --   --  23.7* 14.4*  HGB 7.3* 7.4* 7.9* 7.2* 7.9* 7.5*  HCT 23.4* 23.7* 24.3* 22.5* 25.1* 23.8*  MCV 91.4  --  90.7 90.7 90.6 92.6  PLT 218  --  233 243 361 271    Basic Metabolic Panel: Recent Labs  Lab 11/23/23 0510 11/24/23 0841 11/25/23 0209 11/26/23 0256 11/27/23 0235 11/28/23 0545 11/29/23 0230  NA 133* 132* 134* 135 134* 136 136  K 3.1*  3.8 4.0 3.5 3.6 3.5 4.6  CL 97* 96* 97* 96* 96* 96* 97*  CO2 25 22 25 25 26 26 24   GLUCOSE 120* 201* 154* 148* 166* 153* 211*  BUN 71* 79* 91* 93* 93* 92* 103*  CREATININE 2.88* 2.75* 2.31* 2.10* 2.06* 1.99* 2.43*  CALCIUM  8.4* 8.8* 8.7* 8.8* 9.0 9.2 9.5  MG 2.1 1.9 1.6* 2.5* 2.0  --  2.0  PHOS 4.7* 4.2 3.4 3.1 2.6  --   --    GFR: Estimated Creatinine Clearance: 27.3 mL/min (A) (by C-G formula based on SCr of 2.43 mg/dL (H)). Recent Labs  Lab 11/27/23 1342 11/28/23 0545 11/28/23 1501 11/29/23 0230  PROCALCITON  --   --   --  0.88  WBC 18.9* 18.2* 29.2* 21.0*  LATICACIDVEN  --  0.9  --   --     Liver Function Tests: Recent Labs  Lab 11/23/23 0510 11/24/23 0841 11/25/23 0209 11/26/23 0256 11/27/23 0235  ALBUMIN  2.6* 2.6* 2.6* 2.7* 2.8*   No results for input(s): LIPASE, AMYLASE in the last 168 hours. No results for input(s): AMMONIA in the last 168 hours.  ABG    Component Value Date/Time   PHART 7.343 (L) 11/15/2023 0819   PCO2ART 45.2 11/15/2023 0819   PO2ART 117 (H) 11/15/2023  0819   HCO3 24.2 11/15/2023 0819   TCO2 25 11/15/2023 0819   ACIDBASEDEF 1.0 11/15/2023 0819   O2SAT 56.3 11/27/2023 0304     Coagulation Profile: No results for input(s): INR, PROTIME in the last 168 hours.  Cardiac Enzymes: No results for input(s): CKTOTAL, CKMB, CKMBINDEX, TROPONINI in the last 168 hours.  HbA1C: Hgb A1c MFr Bld  Date/Time Value Ref Range Status  10/28/2023 07:40 PM 5.5 4.8 - 5.6 % Final    Comment:    (NOTE) Diagnosis of Diabetes The following HbA1c ranges recommended by the American Diabetes Association (ADA) may be used as an aid in the diagnosis of diabetes mellitus.  Hemoglobin             Suggested A1C NGSP%              Diagnosis  <5.7                   Non Diabetic  5.7-6.4                Pre-Diabetic  >6.4                   Diabetic  <7.0                   Glycemic control for                       adults with  diabetes.    08/02/2022 08:48 PM 6.4 (H) 4.8 - 5.6 % Final    Comment:    (NOTE)         Prediabetes: 5.7 - 6.4         Diabetes: >6.4         Glycemic control for adults with diabetes: <7.0     CBG: Recent Labs  Lab 11/28/23 1115 11/28/23 1237 11/28/23 1634 11/28/23 2123 11/29/23 0621  GLUCAP 178* 169* 205* 207* 173*    The patient is critically ill due to acute respiratory failure due to recurrent pulmonary edema/acute on chronic HFpEF.  Critical care was necessary to treat or prevent imminent or life-threatening deterioration.  Critical care was time spent personally by me on the following activities: development of treatment plan with patient and/or surrogate as well as nursing, discussions with consultants, evaluation of patient's response to treatment, examination of patient, obtaining history from patient or surrogate, ordering and performing treatments and interventions, ordering and review of laboratory studies, ordering and review of radiographic studies, pulse oximetry, re-evaluation of patient's condition and participation in multidisciplinary rounds.   During this encounter critical care time was devoted to patient care services described in this note for 40 minutes.     Valinda Novas, MD [place] [facility] See Amion for pager If no response to pager, please call [phone] until 7pm After 7pm, Please call E-link [phone]

## 2023-11-29 NOTE — Progress Notes (Signed)
 Patient ID: Kayla Schmitt, female   DOB: 1965/10/27, 58 y.o.   MRN: [id number]     Advanced Heart Failure Rounding Note  Cardiologist: Vishnu SHAUNNA Maywood, MD  [facility] Consulting MD: Dr. Cherrie Chief Complaint: Acute on chronic HFpEF / RV Failure Patient Profile   58 y.o. female with history of PAD with prior SFA stents and bilateral TMA, severe 3v CAD, MR/AS, DM II, anemia, HTN, HLD, hypothyroidism.  Admitted in 6/25 with MRSA bactermia and necrotizing fasciitis s/p left BKA. Transferred back to [facility] from rehab d/t acute on chronic CHF with concern for low-output and acute respiratory failure.  Subjective:    7/18: aborted cath for acute respiratory distress; intubated; DBA started 7/19: started on CRRT 7/20: extubated 7/21: hgb 6.5 s/p 1u RBCs 7/23: CRRT stopped  Patient became more short of breath yesterday afternoon.  SBP 80s-90s (has been in this range).  She was moved back to ICU and give 1 dose of bumetanide  1 mg IV.  BUN/creatinine higher at 103/2.43.   Hgb 8.1>7.3>7.2>7.5, ongoing hematuria.  Started on CBI but this was stopped as unsuccessful.  Still with hematuria this morning.    WBCs 21K today, she is on ceftriaxone  for UTI.   She is still short of breath with orthopnea.  She has received torsemide  80 mg this morning.   Objective:    Weight Range: 85.3 kg Body mass index is 32.28 kg/m.   Vital Signs:   Temp:  [96.4 F (35.8 C)-98.4 F (36.9 C)] 97.9 F (36.6 C) (08/01 0300) Pulse Rate:  [89-107] 98 (08/01 0800) Resp:  [16-38] 25 (08/01 0800) BP: (82-109)/(55-72) 82/59 (08/01 0800) SpO2:  [87 %-100 %] 99 % (08/01 0800) FiO2 (%):  [40 %] 40 % (08/01 0407) Weight:  [85.3 kg] 85.3 kg (08/01 0500) Last BM Date : 11/28/23  Weight change: Filed Weights   11/26/23 0500 11/27/23 0437 11/29/23 0500  Weight: 86.7 kg 84.4 kg 85.3 kg   Intake/Output:  Intake/Output Summary (Last 24 hours) at 11/29/2023 0814 Last data filed at 11/28/2023 2254 Gross per 24 hour   Intake 80 ml  Output 350 ml  Net -270 ml    Physical Exam   General: NAD Neck: JVP difficult, suspect elevated, no thyromegaly or thyroid nodule.  Lungs: Clear to auscultation bilaterally with normal respiratory effort. CV: Nondisplaced PMI.  Heart regular S1/S2, no S3/S4, no murmur.  1+ edema to knee on right.   Abdomen: Soft, nontender, no hepatosplenomegaly, no distention.  Skin: Intact without lesions or rashes.  Neurologic: Alert and oriented x 3.  Psych: Normal affect. Extremities: No clubbing or cyanosis. Right TMA, left BKA.  HEENT: Normal.   Telemetry   NSR 90s with PVCs (personally reviewed)  Labs    CBC Recent Labs    11/28/23 1501 11/29/23 0230  WBC 29.2* 21.0*  NEUTROABS 23.7* 14.4*  HGB 7.9* 7.5*  HCT 25.1* 23.8*  MCV 90.6 92.6  PLT 361 271   Basic Metabolic Panel Recent Labs    92/69/74 0235 11/28/23 0545 11/29/23 0230  NA 134* 136 136  K 3.6 3.5 4.6  CL 96* 96* 97*  CO2 26 26 24   GLUCOSE 166* 153* 211*  BUN 93* 92* 103*  CREATININE 2.06* 1.99* 2.43*  CALCIUM  9.0 9.2 9.5  MG 2.0  --  2.0  PHOS 2.6  --   --    Liver Function Tests Recent Labs    11/27/23 0235  ALBUMIN  2.8*   BNP (last 3 results) Recent Labs  11/07/23 1457 11/13/23 0521 11/29/23 0230  BNP 336.4* 1,626.5* 2,406.9*   Medications:    Scheduled Medications:  sodium chloride    Intravenous Once   atorvastatin   40 mg Oral Daily   bethanechol   10 mg Oral TID   Chlorhexidine  Gluconate Cloth  6 each Topical Daily   docusate sodium   100 mg Oral BID   feeding supplement  237 mL Oral Q1400   ferrous sulfate   325 mg Oral Q breakfast   gabapentin   300 mg Oral QHS   hydrocortisone  cream   Topical TID   insulin  aspart  0-15 Units Subcutaneous TID WC   insulin  glargine-yfgn  6 Units Subcutaneous BID   levothyroxine   150 mcg Oral Q0600   midodrine   10 mg Oral Q8H   midodrine   5 mg Oral TID WC   multivitamin with minerals  1 tablet Oral Daily   nutrition supplement  (JUVEN)  1 packet Oral BID AC & HS   pantoprazole   40 mg Oral Daily   sodium chloride  flush  10-40 mL Intracatheter Q12H   torsemide   80 mg Oral BID   Infusions:  cefTRIAXone  (ROCEPHIN )  IV     sodium chloride  irrigation      PRN Medications: acetaminophen , Gerhardt's butt cream, ipratropium-albuterol , lidocaine , mouth rinse, mouth rinse, oxyCODONE , polyethylene glycol, prochlorperazine , senna-docusate, sodium chloride  flush  Assessment/Plan  Acute on chronic HFpEF>Mixed Shock; Cardiogenic/Septic - TEE 6/25: EF 60-65%, RV okay, mild to mod MR, moderate-severe AS with mG 27 mmHg and AVA 1.07 cm2 - TTE 11/15/23 LVEF 25% - Taken for [facility] 7/17, however developed flash pulmonary edema. Unable to lie flat for procedure.  - Repeat echo with EF 25-30%, G2DD, mild to mod MR, mod-severe AS  - GDMT limited by renal function and hypotension (SBP 80s-90s generally).  - More short of breath yesterday pm with CXR showing pulmonary edema, had IV bumetanide  last night but got po torsemide  already this morning.  - Will start midodrine  5 mg tid, can titrate up as needed.  - Will take for RHC this morning for formal assessment of filling pressures, will likely need to start IV Lasix  but will not give a dose yet as she just got torsemide . Discussed risks/benefits of cath with patient, she agrees to procedure.   2. Acute respiratory failure with hypoxia - CXR 7/20 with b/l diffuse airspace opacities, likely pulmonary edema, though given fevers also concern for PNA - CXR 7/31 consistent with pulmonary edema, currently on nasal cannula.     3. CAD - NSTEMI in 4/24. Multivessel CAD. CABG had been recommended but managed medically due to other active medical issues - Further ischemic eval pending renal function => no cath with AKI.  - continue aspirin  + statin   4. Valvular heart disease - Mild to moderate MR and moderate to severe AS on echo 6/25 - Repeat echo 7/25 with extremely poor windows, based on exam  expect AS moderate-severe - Echo 11/20/23 with Vmax 3.21, DVT 0.26, mod to severe AS, mG 22 - She is not a TAVR or SAVR candidate currently so even if she has severe AS it would not change current management   5. MRSA bacteremia - Necrotizing fasciitis - S/p recent left BKA 6/25; S/p TMA in 4/24  6. UTI - WBCs 21K today, on ceftriaxone  for UTI.   7. PAD - hx SFA stents - On aspirin  + statin. Had been on plavix  but stopped d/t risk of bleeding.   8. Anemia - s/p 1 u RBC  7/14 - Hx GI bleed from AVMs.  - fecal occult negative - Now with hematuria, failed CBI.  - hgb 6.5>1u RBC>7.4>6.9>1u RBC>8.4>8.1>7.3>7.2>7.5 - Transfuse hgb < 7.    9. AKI on CKD IIIa - In setting of volume overload/low-output.  - Started on CRRT 7/19, stopped 7/22.  - Some renal recovery and creatinine had been trending down, but BUN/creatinine now trending back up and developing volume overload.  - As above, RHC today.  Will likely need resumption of IV Lasix  and if this fails/creatinine continues to rise, may need to resume RRT.  Nephrology following.   10. Hematuria - Has UTI by UA but now ongoing hematuria.  - Ceftriaxone  as above.  - Attempted CBI but this failed.  - Needs urology evaluation.   CRITICAL CARE Performed by: Ezra Shuck  Total critical care time: 40 minutes  Critical care time was exclusive of separately billable procedures and treating other patients.  Critical care was necessary to treat or prevent imminent or life-threatening deterioration.  Critical care was time spent personally by me on the following activities: development of treatment plan with patient and/or surrogate as well as nursing, discussions with consultants, evaluation of patient's response to treatment, examination of patient, obtaining history from patient or surrogate, ordering and performing treatments and interventions, ordering and review of laboratory studies, ordering and review of radiographic studies, pulse  oximetry and re-evaluation of patient's condition.   Ezra Shuck 11/29/2023 8:14 AM

## 2023-11-29 NOTE — Progress Notes (Signed)
 OT Cancellation Note  Patient Details Name: Kayla Schmitt MRN: [id number] DOB: [birth year]   Cancelled Treatment:    Reason Eval/Treat Not Completed: Patient at procedure or test/ unavailable Off unit for cath lab this AM. Will follow up for OT session as schedule permits.  Mliss Fish 11/29/2023, 11:49 AM

## 2023-11-29 NOTE — Plan of Care (Signed)
  Problem: Elimination: Goal: Will not experience complications related to bowel motility Outcome: Progressing   Problem: Pain Managment: Goal: General experience of comfort will improve and/or be controlled Outcome: Progressing   Problem: Skin Integrity: Goal: Risk for impaired skin integrity will decrease Outcome: Progressing

## 2023-11-30 ENCOUNTER — Inpatient Hospital Stay (HOSPITAL_COMMUNITY): Admitting: Anesthesiology

## 2023-11-30 ENCOUNTER — Inpatient Hospital Stay (HOSPITAL_COMMUNITY)

## 2023-11-30 ENCOUNTER — Other Ambulatory Visit: Payer: Self-pay | Admitting: Urology

## 2023-11-30 ENCOUNTER — Encounter (HOSPITAL_COMMUNITY)
Admission: EM | Disposition: A | Discharge status: Home Health | Payer: Self-pay | Source: Skilled Nursing Facility | Attending: Internal Medicine

## 2023-11-30 ENCOUNTER — Encounter (HOSPITAL_COMMUNITY): Payer: Self-pay | Admitting: Cardiology

## 2023-11-30 DIAGNOSIS — I252 Old myocardial infarction: Secondary | ICD-10-CM

## 2023-11-30 DIAGNOSIS — R310 Gross hematuria: Secondary | ICD-10-CM

## 2023-11-30 DIAGNOSIS — R338 Other retention of urine: Secondary | ICD-10-CM

## 2023-11-30 DIAGNOSIS — I2510 Atherosclerotic heart disease of native coronary artery without angina pectoris: Secondary | ICD-10-CM

## 2023-11-30 DIAGNOSIS — N390 Urinary tract infection, site not specified: Secondary | ICD-10-CM

## 2023-11-30 DIAGNOSIS — E039 Hypothyroidism, unspecified: Secondary | ICD-10-CM

## 2023-11-30 HISTORY — PX: CYSTOSCOPY WITH STENT PLACEMENT: SHX5790

## 2023-11-30 LAB — POCT I-STAT 7, (LYTES, BLD GAS, ICA,H+H)
Acid-base deficit: 1 mmol/L (ref 0.0–2.0)
Acid-base deficit: 2 mmol/L (ref 0.0–2.0)
Acid-base deficit: 1 mmol/L (ref 0.0–2.0)
Acid-base deficit: 2 mmol/L (ref 0.0–2.0)
Acid-base deficit: 1 mmol/L (ref 0.0–2.0)
Bicarbonate: 25.6 mmol/L (ref 20.0–28.0)
Bicarbonate: 23.5 mmol/L (ref 20.0–28.0)
Bicarbonate: 24.1 mmol/L (ref 20.0–28.0)
Bicarbonate: 25.9 mmol/L (ref 20.0–28.0)
Bicarbonate: 25.6 mmol/L (ref 20.0–28.0)
Calcium, Ion: 1.18 mmol/L (ref 1.15–1.40)
Calcium, Ion: 1.16 mmol/L (ref 1.15–1.40)
Calcium, Ion: 1.14 mmol/L — ABNORMAL LOW (ref 1.15–1.40)
Calcium, Ion: 1.21 mmol/L (ref 1.15–1.40)
Calcium, Ion: 1.18 mmol/L (ref 1.15–1.40)
HCT: 24 % — ABNORMAL LOW (ref 36.0–46.0)
HCT: 23 % — ABNORMAL LOW (ref 36.0–46.0)
HCT: 26 % — ABNORMAL LOW (ref 36.0–46.0)
HCT: 28 % — ABNORMAL LOW (ref 36.0–46.0)
HCT: 28 % — ABNORMAL LOW (ref 36.0–46.0)
Hemoglobin: 8.2 g/dL — ABNORMAL LOW (ref 12.0–15.0)
Hemoglobin: 7.8 g/dL — ABNORMAL LOW (ref 12.0–15.0)
Hemoglobin: 8.8 g/dL — ABNORMAL LOW (ref 12.0–15.0)
Hemoglobin: 9.5 g/dL — ABNORMAL LOW (ref 12.0–15.0)
Hemoglobin: 9.5 g/dL — ABNORMAL LOW (ref 12.0–15.0)
O2 Saturation: 97 %
O2 Saturation: 98 %
O2 Saturation: 99 %
O2 Saturation: 98 %
O2 Saturation: 99 %
Patient temperature: 36
Patient temperature: 35.2
Patient temperature: 96.2
Potassium: 4.5 mmol/L (ref 3.5–5.1)
Potassium: 4.3 mmol/L (ref 3.5–5.1)
Potassium: 4.4 mmol/L (ref 3.5–5.1)
Potassium: 3.9 mmol/L (ref 3.5–5.1)
Potassium: 3.9 mmol/L (ref 3.5–5.1)
Sodium: 136 mmol/L (ref 135–145)
Sodium: 136 mmol/L (ref 135–145)
Sodium: 137 mmol/L (ref 135–145)
Sodium: 139 mmol/L (ref 135–145)
Sodium: 139 mmol/L (ref 135–145)
TCO2: 27 mmol/L (ref 22–32)
TCO2: 25 mmol/L (ref 22–32)
TCO2: 25 mmol/L (ref 22–32)
TCO2: 28 mmol/L (ref 22–32)
TCO2: 27 mmol/L (ref 22–32)
pCO2 arterial: 47 mmHg (ref 32–48)
pCO2 arterial: 37.2 mmHg (ref 32–48)
pCO2 arterial: 40.6 mmHg (ref 32–48)
pCO2 arterial: 61.2 mmHg — ABNORMAL HIGH (ref 32–48)
pCO2 arterial: 48.6 mmHg — ABNORMAL HIGH (ref 32–48)
pH, Arterial: 7.34 — ABNORMAL LOW (ref 7.35–7.45)
pH, Arterial: 7.4 (ref 7.35–7.45)
pH, Arterial: 7.383 (ref 7.35–7.45)
pH, Arterial: 7.234 — ABNORMAL LOW (ref 7.35–7.45)
pH, Arterial: 7.323 — ABNORMAL LOW (ref 7.35–7.45)
pO2, Arterial: 97 mmHg (ref 83–108)
pO2, Arterial: 98 mmHg (ref 83–108)
pO2, Arterial: 121 mmHg — ABNORMAL HIGH (ref 83–108)
pO2, Arterial: 134 mmHg — ABNORMAL HIGH (ref 83–108)
pO2, Arterial: 150 mmHg — ABNORMAL HIGH (ref 83–108)

## 2023-11-30 LAB — COMPREHENSIVE METABOLIC PANEL WITH GFR
ALT: 19 U/L (ref 0–44)
AST: 25 U/L (ref 15–41)
Albumin: 3.2 g/dL — ABNORMAL LOW (ref 3.5–5.0)
Alkaline Phosphatase: 80 U/L (ref 38–126)
Anion gap: 20 — ABNORMAL HIGH (ref 5–15)
BUN: 120 mg/dL — ABNORMAL HIGH (ref 6–20)
CO2: 21 mmol/L — ABNORMAL LOW (ref 22–32)
Calcium: 9.6 mg/dL (ref 8.9–10.3)
Chloride: 96 mmol/L — ABNORMAL LOW (ref 98–111)
Creatinine, Ser: 2.5 mg/dL — ABNORMAL HIGH (ref 0.44–1.00)
GFR, Estimated: 22 mL/min — ABNORMAL LOW (ref >60)
Glucose, Bld: 198 mg/dL — ABNORMAL HIGH (ref 70–99)
Potassium: 4.7 mmol/L (ref 3.5–5.1)
Sodium: 137 mmol/L (ref 135–145)
Total Bilirubin: 1.2 mg/dL (ref 0.0–1.2)
Total Protein: 7.8 g/dL (ref 6.5–8.1)

## 2023-11-30 LAB — CBC
HCT: 25.8 % — ABNORMAL LOW (ref 36.0–46.0)
Hemoglobin: 7.9 g/dL — ABNORMAL LOW (ref 12.0–15.0)
MCH: 29.3 pg (ref 26.0–34.0)
MCHC: 30.6 g/dL (ref 30.0–36.0)
MCV: 95.6 fL (ref 80.0–100.0)
Platelets: 334 K/uL (ref 150–400)
RBC: 2.7 MIL/uL — ABNORMAL LOW (ref 3.87–5.11)
RDW: 23.5 % — ABNORMAL HIGH (ref 11.5–15.5)
WBC: 16.4 K/uL — ABNORMAL HIGH (ref 4.0–10.5)
nRBC: 0.5 % — ABNORMAL HIGH (ref 0.0–0.2)

## 2023-11-30 LAB — GLUCOSE, CAPILLARY
Glucose-Capillary: 127 mg/dL — ABNORMAL HIGH (ref 70–99)
Glucose-Capillary: 144 mg/dL — ABNORMAL HIGH (ref 70–99)
Glucose-Capillary: 164 mg/dL — ABNORMAL HIGH (ref 70–99)
Glucose-Capillary: 169 mg/dL — ABNORMAL HIGH (ref 70–99)
Glucose-Capillary: 189 mg/dL — ABNORMAL HIGH (ref 70–99)
Glucose-Capillary: 204 mg/dL — ABNORMAL HIGH (ref 70–99)

## 2023-11-30 LAB — PREPARE RBC (CROSSMATCH)

## 2023-11-30 SURGERY — CYSTOSCOPY, WITH STENT INSERTION
Anesthesia: General | Site: Bladder

## 2023-11-30 MED ORDER — LIDOCAINE 2% (20 MG/ML) 5 ML SYRINGE
INTRAMUSCULAR | Status: DC | PRN
  Administered 2023-11-30: 20 mg via INTRAVENOUS

## 2023-11-30 MED ORDER — ORAL CARE MOUTH RINSE
15.0000 mL | OROMUCOSAL | Status: DC
  Administered 2023-11-30 – 2023-12-03 (×29): 15 mL via OROMUCOSAL

## 2023-11-30 MED ORDER — SODIUM CHLORIDE 0.9 % IR SOLN
Status: DC | PRN
  Administered 2023-11-30: 18000 mL via INTRAVESICAL

## 2023-11-30 MED ORDER — 0.9 % SODIUM CHLORIDE (POUR BTL) OPTIME
TOPICAL | Status: DC | PRN
  Administered 2023-11-30: 2000 mL

## 2023-11-30 MED ORDER — ETOMIDATE 2 MG/ML IV SOLN
INTRAVENOUS | Status: DC | PRN
Start: 2023-11-30 — End: 2023-11-30
  Administered 2023-11-30: 20 mg via INTRAVENOUS

## 2023-11-30 MED ORDER — STERILE WATER FOR IRRIGATION IR SOLN
Status: DC | PRN
  Administered 2023-11-30: 1000 mL

## 2023-11-30 MED ORDER — SODIUM CHLORIDE 0.9 % IR SOLN
Status: DC | PRN
Start: 2023-11-30 — End: 2023-11-30
  Administered 2023-11-30: 9000 mL via INTRAVESICAL

## 2023-11-30 MED ORDER — MIDAZOLAM HCL 2 MG/2ML IJ SOLN
INTRAMUSCULAR | Status: AC
  Filled 2023-11-30: qty 2

## 2023-11-30 MED ORDER — FUROSEMIDE 10 MG/ML IJ SOLN
INTRAMUSCULAR | Status: DC | PRN
  Administered 2023-11-30: 120 mg via INTRAVENOUS

## 2023-11-30 MED ORDER — FUROSEMIDE 10 MG/ML IJ SOLN
INTRAMUSCULAR | Status: DC | PRN
Start: 2023-11-30 — End: 2023-11-30

## 2023-11-30 MED ORDER — ROCURONIUM BROMIDE 10 MG/ML (PF) SYRINGE
PREFILLED_SYRINGE | INTRAVENOUS | Status: DC | PRN
  Administered 2023-11-30: 60 mg via INTRAVENOUS
  Administered 2023-11-30 (×5): 20 mg via INTRAVENOUS

## 2023-11-30 MED ORDER — SODIUM CHLORIDE 0.9 % IV SOLN: 10.0000 mL/h | Freq: Once | INTRAVENOUS | Status: AC

## 2023-11-30 MED ORDER — PHENYLEPHRINE HCL-NACL 20-0.9 MG/250ML-% IV SOLN
0.0000 ug/min | INTRAVENOUS | Status: DC
  Administered 2023-12-01: 20 ug/min via INTRAVENOUS
  Filled 2023-11-30: qty 250

## 2023-11-30 MED ORDER — LIDOCAINE HCL URETHRAL/MUCOSAL 2 % EX GEL
CUTANEOUS | Status: AC
  Filled 2023-11-30: qty 11

## 2023-11-30 MED ORDER — INSULIN ASPART 100 UNIT/ML IJ SOLN
0.0000 [IU] | INTRAMUSCULAR | Status: DC
  Administered 2023-12-01 (×4): 3 [IU] via SUBCUTANEOUS
  Administered 2023-12-02 (×3): 2 [IU] via SUBCUTANEOUS

## 2023-11-30 MED ORDER — ORAL CARE MOUTH RINSE
15.0000 mL | OROMUCOSAL | Status: DC | PRN
Start: 2023-11-30 — End: 2023-12-06

## 2023-11-30 MED ORDER — PROPOFOL 10 MG/ML IV BOLUS
INTRAVENOUS | Status: AC
  Filled 2023-11-30: qty 20

## 2023-11-30 MED ORDER — FUROSEMIDE 10 MG/ML IJ SOLN
INTRAMUSCULAR | Status: AC
Start: 2023-11-30 — End: 2023-11-30
  Filled 2023-11-30: qty 12

## 2023-11-30 MED ORDER — IOHEXOL 300 MG/ML  SOLN: INTRAMUSCULAR | Status: DC | PRN

## 2023-11-30 MED ORDER — FENTANYL CITRATE (PF) 250 MCG/5ML IJ SOLN
INTRAMUSCULAR | Status: AC
  Filled 2023-11-30: qty 5

## 2023-11-30 MED ORDER — ETOMIDATE 2 MG/ML IV SOLN
INTRAVENOUS | Status: AC
  Filled 2023-11-30: qty 10

## 2023-11-30 MED ORDER — PHENYLEPHRINE HCL-NACL 20-0.9 MG/250ML-% IV SOLN
INTRAVENOUS | Status: DC | PRN
  Administered 2023-11-30: 20 ug/min via INTRAVENOUS

## 2023-11-30 MED ORDER — SODIUM CHLORIDE 0.9 % IR SOLN
Status: DC | PRN
  Administered 2023-11-30: 93000 mL via INTRAVESICAL

## 2023-11-30 MED ORDER — PROPOFOL 1000 MG/100ML IV EMUL
INTRAVENOUS | Status: AC
  Administered 2023-11-30: 50 ug/kg/min via INTRAVENOUS
  Filled 2023-11-30: qty 100

## 2023-11-30 MED ORDER — MIDAZOLAM HCL 2 MG/2ML IJ SOLN
INTRAMUSCULAR | Status: DC | PRN
  Administered 2023-11-30: 1 mg via INTRAVENOUS

## 2023-11-30 MED ORDER — FENTANYL CITRATE (PF) 250 MCG/5ML IJ SOLN
INTRAMUSCULAR | Status: DC | PRN
  Administered 2023-11-30 (×3): 25 ug via INTRAVENOUS

## 2023-11-30 MED ORDER — ROCURONIUM BROMIDE 10 MG/ML (PF) SYRINGE
PREFILLED_SYRINGE | INTRAVENOUS | Status: AC
  Filled 2023-11-30: qty 10

## 2023-11-30 MED ORDER — PROPOFOL 1000 MG/100ML IV EMUL
0.0000 ug/kg/min | INTRAVENOUS | Status: DC
  Administered 2023-11-30: 70 ug/kg/min via INTRAVENOUS
  Administered 2023-11-30: 75 ug/kg/min via INTRAVENOUS
  Administered 2023-12-01: 70 ug/kg/min via INTRAVENOUS
  Administered 2023-12-01: 30 ug/kg/min via INTRAVENOUS
  Administered 2023-12-01: 45 ug/kg/min via INTRAVENOUS
  Administered 2023-12-01: 70 ug/kg/min via INTRAVENOUS
  Administered 2023-12-01 (×2): 30 ug/kg/min via INTRAVENOUS
  Administered 2023-12-01: 40 ug/kg/min via INTRAVENOUS
  Administered 2023-12-02: 45 ug/kg/min via INTRAVENOUS
  Filled 2023-11-30 (×9): qty 100

## 2023-11-30 MED ORDER — SODIUM CHLORIDE 0.9 % IV SOLN: INTRAVENOUS | Status: DC | PRN

## 2023-11-30 SURGICAL SUPPLY — 28 items
BAG DRAIN URO-CYSTO SKYTR STRL (DRAIN) ×2 IMPLANT
BAG URINE DRAIN 2000ML AR STRL (UROLOGICAL SUPPLIES) ×2 IMPLANT
CATH FOLEY 2WAY SLVR 5CC 16FR (CATHETERS) IMPLANT
CATH HEMA 3WAY 30CC 24FR COUDE (CATHETERS) IMPLANT
CATH URETL OPEN END 6FR 70 (CATHETERS) ×2 IMPLANT
COVER SURGICAL LIGHT HANDLE (MISCELLANEOUS) IMPLANT
ELECTRODE REM PT RTRN 9FT ADLT (ELECTROSURGICAL) IMPLANT
EVACUATOR MICROVAS BLADDER (UROLOGICAL SUPPLIES) IMPLANT
GLOVE BIO SURGEON STRL SZ7.5 (GLOVE) ×2 IMPLANT
GOWN STRL REUS W/ TWL LRG LVL3 (GOWN DISPOSABLE) ×2 IMPLANT
GOWN STRL REUS W/ TWL XL LVL3 (GOWN DISPOSABLE) ×2 IMPLANT
GUIDEWIRE ANG ZIPWIRE 038X150 (WIRE) IMPLANT
GUIDEWIRE STR DUAL SENSOR (WIRE) ×2 IMPLANT
KIT TURNOVER KIT B (KITS) ×2 IMPLANT
LOOP CUT BIPOLAR 24F LRG (ELECTROSURGICAL) IMPLANT
LOOP MONOPOLAR YLW (ELECTROSURGICAL) IMPLANT
MANIFOLD NEPTUNE II (INSTRUMENTS) ×2 IMPLANT
NS IRRIG 1000ML POUR BTL (IV SOLUTION) ×2 IMPLANT
PACK CYSTO (CUSTOM PROCEDURE TRAY) ×2 IMPLANT
STENT URET 6FRX24 CONTOUR (STENTS) IMPLANT
STENT URET 6FRX26 CONTOUR (STENTS) IMPLANT
SYPHON OMNI JUG (MISCELLANEOUS) ×2 IMPLANT
SYR 30ML LL (SYRINGE) IMPLANT
SYR TOOMEY 50ML (SYRINGE) IMPLANT
SYRINGE TOOMEY IRRIG 70ML (MISCELLANEOUS) IMPLANT
TOWEL GREEN STERILE FF (TOWEL DISPOSABLE) ×2 IMPLANT
TUBE CONNECTING 12X1/4 (SUCTIONS) IMPLANT
WATER STERILE IRR 3000ML UROMA (IV SOLUTION) ×2 IMPLANT

## 2023-11-30 NOTE — Anesthesia Procedure Notes (Signed)
 Procedure Name: Intubation Date/Time: 11/30/2023 8:33 AM  Performed by: Lylia Schuyler BROCKS, CRNAPre-anesthesia Checklist: Patient identified, Emergency Drugs available, Suction available and Patient being monitored Patient Re-evaluated:Patient Re-evaluated prior to induction Oxygen  Delivery Method: Circle system utilized Preoxygenation: Pre-oxygenation with 100% oxygen  Induction Type: IV induction Ventilation: Mask ventilation without difficulty Laryngoscope Size: Mac and 4 Grade View: Grade I Tube type: Oral Tube size: 7.0 mm Number of attempts: 1 Airway Equipment and Method: Stylet and Oral airway Placement Confirmation: ETT inserted through vocal cords under direct vision, positive ETCO2 and breath sounds checked- equal and bilateral Secured at: 21 cm Tube secured with: Tape Dental Injury: Teeth and Oropharynx as per pre-operative assessment

## 2023-11-30 NOTE — Progress Notes (Signed)
 Patient ID: Kayla Schmitt, female   DOB: October 02, 1965, 58 y.o.   MRN: [id number]     Advanced Heart Failure Rounding Note  Cardiologist: Vishnu SHAUNNA Maywood, MD  AH[facility] Consulting MD: Dr. Cherrie Chief Complaint: Acute on chronic HFpEF / RV Failure Patient Profile   58 y.o. female with history of PAD with prior SFA stents and bilateral TMA, severe 3v CAD, MR/AS, DM II, anemia, HTN, HLD, hypothyroidism.  Admitted in 6/25 with MRSA bactermia and necrotizing fasciitis s/p left BKA. Transferred back to [facility] from rehab d/t acute on chronic CHF with concern for low-output and acute respiratory failure.  Subjective:    7/18: aborted cath for acute respiratory distress; intubated; DBA started 7/19: started on CRRT 7/20: extubated 7/21: hgb 6.5 s/p 1u RBCs 7/23: CRRT stopped  No labs yet this morning, lab unable to get initial draw.  550 cc UOP documented but foley not functioning now and she has been draining around it into the bed.  Urology has seen her, has to go to OR for declotting of bladder.   Hgb 8.1>7.3>7.2>7.5>pending today, ongoing hematuria.  CBI unsuccessful, bladder not draining. She remains on ceftriaxone  for UTI.   She is on midodrine  5 mg tid, SBP 90s-100s.   She is now comfortable on Bipap.   8/1 RHC Procedural Findings: Hemodynamics (mmHg) RA mean 8 RV 58/15 PA 60/25, mean 40 PCWP mean 29 with prominent v waves to 43 Oxygen  saturations: PA 53% AO 100% Cardiac Output (Fick) 5.28  Cardiac Index (Fick) 2.77 PVR 2.1 WU   Objective:    Weight Range: 85.3 kg Body mass index is 32.28 kg/m.   Vital Signs:   Temp:  [97.9 F (36.6 C)-98.1 F (36.7 C)] 98.1 F (36.7 C) (08/02 0320) Pulse Rate:  [0-102] 95 (08/02 0600) Resp:  [15-35] 22 (08/02 0600) BP: (82-118)/(52-90) 118/90 (08/02 0600) SpO2:  [94 %-100 %] 98 % (08/02 0600) FiO2 (%):  [40 %] 40 % (08/02 0445) Last BM Date : 11/28/23  Weight change: Filed Weights   11/26/23 0500 11/27/23 0437 11/29/23  0500  Weight: 86.7 kg 84.4 kg 85.3 kg   Intake/Output:  Intake/Output Summary (Last 24 hours) at 11/30/2023 0725 Last data filed at 11/29/2023 2230 Gross per 24 hour  Intake 473.94 ml  Output 550 ml  Net -76.06 ml    Physical Exam   General: NAD Neck: JVP 10-12 cm, no thyromegaly or thyroid nodule.  Lungs: Clear to auscultation bilaterally with normal respiratory effort. CV: Nondisplaced PMI.  Heart regular S1/S2, no S3/S4, 3/6 SEM RUSB.  1+ ankle edema on right.   Abdomen: Soft, nontender, no hepatosplenomegaly, no distention.  Skin: Intact without lesions or rashes.  Neurologic: Alert and oriented x 3.  Psych: Normal affect. Extremities: No clubbing or cyanosis. Left BKA, right TMA HEENT: Normal.   Telemetry   NSR 90s with PVCs (personally reviewed)  Labs    CBC Recent Labs    11/28/23 1501 11/29/23 0230 11/29/23 1056 11/29/23 1057  WBC 29.2* 21.0*  --   --   NEUTROABS 23.7* 14.4*  --   --   HGB 7.9* 7.5* 7.5* 7.1*  HCT 25.1* 23.8* 22.0* 21.0*  MCV 90.6 92.6  --   --   PLT 361 271  --   --    Basic Metabolic Panel Recent Labs    92/68/74 0545 11/29/23 0230 11/29/23 1056 11/29/23 1057  NA 136 136 135 135  K 3.5 4.6 4.8 4.7  CL 96* 97*  --   --  CO2 26 24  --   --   GLUCOSE 153* 211*  --   --   BUN 92* 103*  --   --   CREATININE 1.99* 2.43*  --   --   CALCIUM  9.2 9.5  --   --   MG  --  2.0  --   --    Liver Function Tests No results for input(s): AST, ALT, ALKPHOS, BILITOT, PROT, ALBUMIN  in the last 72 hours.  BNP (last 3 results) Recent Labs    11/07/23 1457 11/13/23 0521 11/29/23 0230  BNP 336.4* 1,626.5* 2,406.9*   Medications:    Scheduled Medications:  sodium chloride    Intravenous Once   atorvastatin   40 mg Oral Daily   bethanechol   10 mg Oral TID   Chlorhexidine  Gluconate Cloth  6 each Topical Daily   feeding supplement  237 mL Oral Q1400   ferrous sulfate   325 mg Oral Q breakfast   gabapentin   300 mg Oral QHS    hydrocortisone  cream   Topical TID   insulin  aspart  0-15 Units Subcutaneous TID WC   insulin  glargine-yfgn  6 Units Subcutaneous BID   levothyroxine   150 mcg Oral Q0600   midodrine   5 mg Oral Q8H   multivitamin with minerals  1 tablet Oral Daily   nutrition supplement (JUVEN)  1 packet Oral BID AC & HS   pantoprazole   40 mg Oral Daily   sodium chloride  flush  10-40 mL Intracatheter Q12H   Infusions:  cefTRIAXone  (ROCEPHIN )  IV Stopped (11/29/23 1014)   furosemide  Stopped (11/29/23 1907)   sodium chloride  irrigation      PRN Medications: acetaminophen , Gerhardt's butt cream, ipratropium-albuterol , lidocaine , mouth rinse, mouth rinse, oxyCODONE , polyethylene glycol, prochlorperazine , senna-docusate, sodium chloride  flush  Assessment/Plan  Acute on chronic HFpEF>Mixed Shock; Cardiogenic/Septic - TEE 6/25: EF 60-65%, RV okay, mild to mod MR, moderate-severe AS with mG 27 mmHg and AVA 1.07 cm2 - TTE 11/15/23 LVEF 25% - Taken for [facility] 7/17, however developed flash pulmonary edema. Unable to lie flat for procedure.  - Repeat echo with EF 25-30%, G2DD, mild to mod MR, mod-severe AS  - GDMT limited by renal function and hypotension (SBP 80s-90s generally). On midodrine  5 mg tid with stable BP this morning.  - RHC on 8/1 showed severely elevated PCWP, mildly elevated RAP, preserved CO.  - She was started on Lasix  120 mg IV bid yesterday but only 550 cc UOP documented, foley not functioning and draining around foley.  Going to OR to declot bladder.  - No labs yet this morning, lab coming to draw now.  I worry that she is going to need to go back on CVVH.  - Would place central line when she goes to OR this morning, follow CVP.   2. Acute respiratory failure with hypoxia - CXR 7/20 with b/l diffuse airspace opacities, likely pulmonary edema, though given fevers also concern for PNA - CXR 7/31 consistent with pulmonary edema.  - Now back on Bipap, suspect she will end up needing CVVH for volume  removal.     3. CAD - NSTEMI in 4/24. Multivessel CAD. CABG had been recommended but managed medically due to other active medical issues - Further ischemic eval pending renal function => no cath with AKI.  - continue aspirin  + statin   4. Valvular heart disease - Mild to moderate MR and moderate to severe AS on echo 6/25 - Repeat echo 7/25 with extremely poor windows, based on exam expect  AS moderate-severe - Echo 11/20/23 with Vmax 3.21, DVT 0.26, mod to severe AS, mG 22 - She is not a TAVR or SAVR candidate currently so even if she has severe AS it would not change current management   5. MRSA bacteremia - Necrotizing fasciitis - S/p recent left BKA 6/25; S/p TMA in 4/24  6. UTI - WBCs 21K on 8/1, on ceftriaxone  for UTI.   7. PAD - hx SFA stents - On aspirin  + statin. Had been on plavix  but stopped d/t risk of bleeding.   8. Anemia - s/p 1 u RBC 7/14 - Hx GI bleed from AVMs.  - fecal occult negative - Now with hematuria, failed CBI.  - hgb 6.5>1u RBC>7.4>6.9>1u RBC>8.4>8.1>7.3>7.2>7.5 - Transfuse hgb < 7. - Pending CBC this morning.     9. AKI on CKD IIIa - In setting of volume overload/low-output.  - Started on CVVH 7/19, stopped 7/22.  - Some renal recovery and creatinine had been trending down, but BUN/creatinine now trending back up and developing volume overload.  - Awaiting BMET today.  With volume overload and AKI along, I worry that she will need to resume CVVH.   10. Hematuria - Has UTI by UA but now ongoing hematuria.  - Ceftriaxone  as above.  - Attempted CBI but this failed with occluded foley.   - Going to OR today with urology to declot bladder.   CRITICAL CARE Performed by: Ezra Shuck  Total critical care time: 45 minutes  Critical care time was exclusive of separately billable procedures and treating other patients.  Critical care was necessary to treat or prevent imminent or life-threatening deterioration.  Critical care was time spent  personally by me on the following activities: development of treatment plan with patient and/or surrogate as well as nursing, discussions with consultants, evaluation of patient's response to treatment, examination of patient, obtaining history from patient or surrogate, ordering and performing treatments and interventions, ordering and review of laboratory studies, ordering and review of radiographic studies, pulse oximetry and re-evaluation of patient's condition.   Ezra Shuck 11/30/2023 7:25 AM

## 2023-11-30 NOTE — Progress Notes (Signed)
 Use roller clamp on CBI to achieve pink lemonade colored urine.    May irrigate foley with 60ml sterile water  if needed for clots.  Above per Dr Roseann (urology)

## 2023-11-30 NOTE — Plan of Care (Signed)
  Problem: Clinical Measurements: Goal: Ability to maintain clinical measurements within normal limits will improve Outcome: Progressing Goal: Will remain free from infection Outcome: Progressing Goal: Diagnostic test results will improve Outcome: Progressing Goal: Respiratory complications will improve Outcome: Progressing Goal: Cardiovascular complication will be avoided Outcome: Progressing   Problem: Activity: Goal: Risk for activity intolerance will decrease Outcome: Progressing   Problem: Elimination: Goal: Will not experience complications related to bowel motility Outcome: Progressing Goal: Will not experience complications related to urinary retention Outcome: Progressing   Problem: Pain Managment: Goal: General experience of comfort will improve and/or be controlled Outcome: Progressing

## 2023-11-30 NOTE — Anesthesia Postprocedure Evaluation (Signed)
 Anesthesia Post Note  Patient: Kayla Schmitt  Procedure(s) Performed: CYSTOSCOPY, WITH FULGURATION OF BLEEDING AND CLOT EVACUATION (Bladder)     Patient location during evaluation: SICU Anesthesia Type: General Level of consciousness: sedated and patient remains intubated per anesthesia plan Pain management: pain level controlled Vital Signs Assessment: post-procedure vital signs reviewed and stable Respiratory status: patient remains intubated per anesthesia plan and patient on ventilator - see flowsheet for VS Cardiovascular status: stable Postop Assessment: no apparent nausea or vomiting Anesthetic complications: no Comments: Pt much more stable post op   No notable events documented.  Last Vitals:  Vitals:   11/30/23 1615 11/30/23 1630  BP:    Pulse: 77 72  Resp:    Temp:    SpO2: 100% 100%    Last Pain:  Vitals:   11/30/23 1600  TempSrc: Axillary  PainSc:                  Emalene Welte,E. Khilynn Borntreger

## 2023-11-30 NOTE — Transfer of Care (Signed)
 Immediate Anesthesia Transfer of Care Note  Patient: Kayla Schmitt  Procedure(s) Performed: CYSTOSCOPY, WITH FULGURATION OF BLEEDING AND CLOT EVACUATION (Bladder)  Patient Location: ICU  Anesthesia Type:General  Level of Consciousness: Patient remains intubated per anesthesia plan  Airway & Oxygen  Therapy: Patient remains intubated per anesthesia plan and Patient placed on Ventilator (see vital sign flow sheet for setting)  Post-op Assessment: Report given to RN and Post -op Vital signs reviewed and stable  Post vital signs: Reviewed and stable  Last Vitals:  Vitals Value Taken Time  BP 119/68 11/30/23 14:45  Temp    Pulse 74 11/30/23 14:45  Resp 16 11/30/23 14:45  SpO2 97 % 11/30/23 14:45    Last Pain:  Vitals:   11/30/23 0715  TempSrc:   PainSc: 0-No pain      Patients Stated Pain Goal: 2 (11/30/23 0715)  Complications: No notable events documented.

## 2023-11-30 NOTE — Progress Notes (Signed)
 Pt taken from 2H25 to OR7 on bipap. RT and OR staff x3 accompanied patient.

## 2023-11-30 NOTE — Anesthesia Procedure Notes (Signed)
 Arterial Line Insertion Start/End8/06/2023 8:35 AM, 11/30/2023 8:39 AM Performed by: Leonce Athens, MD, anesthesiologist  Patient location: OR. Preanesthetic checklist: patient identified, IV checked, site marked, risks and benefits discussed, surgical consent, monitors and equipment checked, pre-op evaluation, timeout performed and anesthesia consent Patient sedated Left, radial was placed Catheter size: 20 G Hand hygiene performed  and maximum sterile barriers used   Attempts: 2 Procedure performed using ultrasound guided technique. Ultrasound Notes:anatomy identified, needle tip was noted to be adjacent to the nerve/plexus identified and no ultrasound evidence of intravascular and/or intraneural injection Following insertion, dressing applied and Biopatch. Post procedure assessment: normal  Patient tolerated the procedure well with no immediate complications.

## 2023-11-30 NOTE — Consult Note (Signed)
 Urology Consult   Physician requesting consult: Valinda Novas, MD  Reason for consult: Gross hematuria, clot retention  History of Present Illness: Kayla Schmitt is a 58 y.o. female seen in consultation from Dr. Novas for evaluation of gross hematuria with clot retention.  She has apparently had gross hematuria for several days.  She had a 3 way foley placed on 7/31 and started on CBI.  By nursing report, the catheter has not been draining and unable to irrigate.  She has had drainage of bloody urine around foley.   Renal U/S from 11/29/23 showed no hydronephrosis, foley catheter in bladder with echogenic debris. U/A from 11/26/23 showe >50 RBC, many bacteria.  No culture done. Cr 2.43 11/29/23. She was dialysis until 11/19/23.  Urology consultation requested for gross hematuria with non-functioning 3 way foley.  Past Medical History:  Diagnosis Date   Anemia    Aortic stenosis    Arthritis    CHF (congestive heart failure) (HCC)    Coronary artery disease    Diabetes mellitus without complication (HCC)    type 2   GERD (gastroesophageal reflux disease)    Heart murmur    History of blood transfusion    Hypercholesteremia    Hypertension    Hypothyroidism    Mitral regurgitation    Myocardial infarction (HCC)    Peripheral vascular disease (HCC)    PONV (postoperative nausea and vomiting)    Thyroid disease     Past Surgical History:  Procedure Laterality Date   ABDOMINAL AORTOGRAM W/LOWER EXTREMITY Bilateral 01/14/2020   Procedure: ABDOMINAL AORTOGRAM W/LOWER EXTREMITY;  Surgeon: Gretta Lonni PARAS, MD;  Location: [facility];  Service: Cardiovascular;  Laterality: Bilateral;   ABDOMINAL AORTOGRAM W/LOWER EXTREMITY N/A 07/05/2022   Procedure: ABDOMINAL AORTOGRAM W/LOWER EXTREMITY;  Surgeon: Gretta Lonni PARAS, MD;  Location: [facility];  Service: Cardiovascular;  Laterality: N/A;   ABDOMINAL AORTOGRAM W/LOWER EXTREMITY N/A 08/02/2022   Procedure: ABDOMINAL  AORTOGRAM W/LOWER EXTREMITY;  Surgeon: Gretta Lonni PARAS, MD;  Location: [facility];  Service: Cardiovascular;  Laterality: N/A;   ABDOMINAL AORTOGRAM W/LOWER EXTREMITY N/A 08/06/2022   Procedure: ABDOMINAL AORTOGRAM W/LOWER EXTREMITY;  Surgeon: Gretta Lonni PARAS, MD;  Location: [facility];  Service: Cardiovascular;  Laterality: N/A;   AMPUTATION Bilateral 07/20/2022   Procedure: AMPUTATION RIGHT GREAT TOE AND SECOND TOE, AMPUTATION LEFT GREAT TOE;  Surgeon: Harden Jerona GAILS, MD;  Location: [facility] OR;  Service: Orthopedics;  Laterality: Bilateral;   AMPUTATION Bilateral 08/08/2022   Procedure: BILATERAL TRANSMETATARSAL AMPUTATION;  Surgeon: Harden Jerona GAILS, MD;  Location: [facility] OR;  Service: Orthopedics;  Laterality: Bilateral;   AMPUTATION Left 10/22/2023   Procedure: AMPUTATION BELOW KNEE;  Surgeon: Lanis Fonda BRAVO, MD;  Location: [facility] OR;  Service: Vascular;  Laterality: Left;   AMPUTATION TOE     BIOPSY  04/06/2023   Procedure: BIOPSY;  Surgeon: Elayah Elspeth SQUIBB, MD;  Location: [facility] ENDOSCOPY;  Service: Gastroenterology;;   COLONOSCOPY WITH PROPOFOL  N/A 04/06/2023   Procedure: COLONOSCOPY WITH PROPOFOL ;  Surgeon: Arihanna Elspeth SQUIBB, MD;  Location: [facility] ENDOSCOPY;  Service: Gastroenterology;  Laterality: N/A;   ESOPHAGOGASTRODUODENOSCOPY (EGD) WITH PROPOFOL  N/A 04/06/2023   Procedure: ESOPHAGOGASTRODUODENOSCOPY (EGD) WITH PROPOFOL ;  Surgeon: Neah Elspeth SQUIBB, MD;  Location: [facility] ENDOSCOPY;  Service: Gastroenterology;  Laterality: N/A;   GIVENS CAPSULE STUDY N/A 04/06/2023   Procedure: GIVENS CAPSULE STUDY;  Surgeon: Auriella Elspeth SQUIBB, MD;  Location: [facility] ENDOSCOPY;  Service: Gastroenterology;  Laterality: N/A;  PERIPHERAL VASCULAR ATHERECTOMY Right 01/14/2020   Procedure: PERIPHERAL VASCULAR ATHERECTOMY;  Surgeon: Gretta Lonni PARAS, MD;  Location: [facility] INVASIVE CV LAB;  Service: Cardiovascular;  Laterality: Right;  sfa   PERIPHERAL VASCULAR INTERVENTION Right 01/14/2020   Procedure:  PERIPHERAL VASCULAR INTERVENTION;  Surgeon: Gretta Lonni PARAS, MD;  Location: [facility];  Service: Cardiovascular;  Laterality: Right;  sfa stent    PERIPHERAL VASCULAR INTERVENTION  07/05/2022   Procedure: PERIPHERAL VASCULAR INTERVENTION;  Surgeon: Gretta Lonni PARAS, MD;  [facility]: [facility];  Service: Cardiovascular;;   PERIPHERAL VASCULAR INTERVENTION  08/02/2022   Procedure: PERIPHERAL VASCULAR INTERVENTION;  Surgeon: Gretta Lonni PARAS, MD;  [facility]: [facility];  Service: Cardiovascular;;   PERIPHERAL VASCULAR INTERVENTION  08/06/2022   Procedure: PERIPHERAL VASCULAR INTERVENTION;  Surgeon: Gretta Lonni PARAS, MD;  Location: [facility] INVASIVE CV LAB;  Service: Cardiovascular;;   PERIPHERAL VASCULAR THROMBECTOMY  08/02/2022   Procedure: PERIPHERAL VASCULAR THROMBECTOMY;  Surgeon: Gretta Lonni PARAS, MD;  [facility]: [facility];  Service: Cardiovascular;;   REVISION AMPUTATION, BELOW THE KNEE Left 10/23/2023   Procedure: REVISION AMPUTATION, BELOW THE KNEE;  Surgeon: Harden Jerona GAILS, MD;  Location: [facility] OR;  Service: Orthopedics;  Laterality: Left;  REVISION LEFT BELOW KNEE AMPUTATION   RIGHT/LEFT HEART CATH AND CORONARY ANGIOGRAPHY N/A 08/23/2022   Procedure: RIGHT/LEFT HEART CATH AND CORONARY ANGIOGRAPHY;  Surgeon: Dann Candyce RAMAN, MD;  Location: [facility] INVASIVE CV LAB;  Service: Cardiovascular;  Laterality: N/A;   SKIN SPLIT GRAFT Right 03/18/2020   Procedure: SKIN GRAFTING RIGHT FOOT ULCER;  Surgeon: Harden Jerona GAILS, MD;  Location: [facility] OR;  Service: Orthopedics;  Laterality: Right;   STUMP REVISION Right 11/30/2022   Procedure: REVISION RIGHT TRANSMETATARSAL AMPUTATION;  Surgeon: Harden Jerona GAILS, MD;  Location: [facility] OR;  Service: Orthopedics;  Laterality: Right;   TEE WITHOUT CARDIOVERSION N/A 08/27/2022   Procedure: TRANSESOPHAGEAL ECHOCARDIOGRAM;  Surgeon: Hobart Powell BRAVO, MD;  Location: [facility] INVASIVE CV LAB;  Service: Cardiovascular;  Laterality: N/A;    TRANSESOPHAGEAL ECHOCARDIOGRAM (CATH LAB) N/A 10/24/2023   Procedure: TRANSESOPHAGEAL ECHOCARDIOGRAM;  Surgeon: Michele Richardson, DO;  Location: [facility];  Service: Cardiovascular;  Laterality: N/A;   WISDOM TOOTH EXTRACTION      Medications:  Scheduled Meds:  sodium chloride    Intravenous Once   atorvastatin   40 mg Oral Daily   bethanechol   10 mg Oral TID   Chlorhexidine  Gluconate Cloth  6 each Topical Daily   feeding supplement  237 mL Oral Q1400   ferrous sulfate   325 mg Oral Q breakfast   gabapentin   300 mg Oral QHS   hydrocortisone  cream   Topical TID   insulin  aspart  0-15 Units Subcutaneous TID WC   insulin  glargine-yfgn  6 Units Subcutaneous BID   levothyroxine   150 mcg Oral Q0600   midodrine   5 mg Oral Q8H   multivitamin with minerals  1 tablet Oral Daily   nutrition supplement (JUVEN)  1 packet Oral BID AC & HS   pantoprazole   40 mg Oral Daily   sodium chloride  flush  10-40 mL Intracatheter Q12H   Continuous Infusions:  cefTRIAXone  (ROCEPHIN )  IV Stopped (11/29/23 1014)   furosemide  Stopped (11/29/23 1907)   sodium chloride  irrigation     PRN Meds:.acetaminophen , Gerhardt's butt cream, ipratropium-albuterol , lidocaine , mouth rinse, mouth rinse, oxyCODONE , polyethylene glycol, prochlorperazine , senna-docusate, sodium chloride  flush  Allergies:  Allergies  Allergen Reactions   Trental  [Pentoxifylline ] Nausea And Vomiting   Vibramycin [Doxycycline] Nausea And  Vomiting    Family History  Problem Relation Age of Onset   Diabetes Other    CAD Other     Social History:  reports that she quit smoking about 6 years ago. Her smoking use included cigarettes. She has never used smokeless tobacco. She reports that she does not currently use alcohol. She reports current drug use. Drug: Marijuana.  ROS: A complete review of systems was performed.  All systems are negative except for pertinent findings as noted.  Physical Exam:  Vital signs in last 24 hours: Temp:   [97.9 F (36.6 C)-98.1 F (36.7 C)] 98.1 F (36.7 C) (08/02 0320) Pulse Rate:  [0-102] 95 (08/02 0600) Resp:  [15-35] 22 (08/02 0600) BP: (82-118)/(52-90) 118/90 (08/02 0600) SpO2:  [94 %-100 %] 98 % (08/02 0600) FiO2 (%):  [40 %] 40 % (08/02 0445) GENERAL APPEARANCE: Ill appearing female, NAD HEENT:  Atraumatic, normocephalic, oropharynx clear NECK:  Supple  ABDOMEN:  Distended, tender in SP area EXTREMITIES:  bilateral BKA, without clubbing, cyanosis, or edema NEUROLOGIC:  Alert and oriented x 3, CN II-XII grossly intact MENTAL STATUS:  appropriate BACK:  Non-tender to palpation, No CVAT SKIN:  Warm, dry, and intact GU:  foley in place without drainage  Laboratory Data:  Recent Labs    11/27/23 1342 11/28/23 0545 11/28/23 1501 11/29/23 0230 11/29/23 1056 11/29/23 1057  WBC 18.9* 18.2* 29.2* 21.0*  --   --   HGB 7.9* 7.2* 7.9* 7.5* 7.5* 7.1*  HCT 24.3* 22.5* 25.1* 23.8* 22.0* 21.0*  PLT 233 243 361 271  --   --     Recent Labs    11/28/23 0545 11/29/23 0230 11/29/23 1056 11/29/23 1057  NA 136 136 135 135  K 3.5 4.6 4.8 4.7  CL 96* 97*  --   --   GLUCOSE 153* 211*  --   --   BUN 92* 103*  --   --   CALCIUM  9.2 9.5  --   --   CREATININE 1.99* 2.43*  --   --      Results for orders placed or performed during the hospital encounter of 11/12/23 (from the past 24 hours)  Glucose, capillary     Status: Abnormal   Collection Time: 11/29/23  8:00 AM  Result Value Ref Range   Glucose-Capillary 193 (H) 70 - 99 mg/dL  POCT I-Stat EG7     Status: Abnormal   Collection Time: 11/29/23 10:56 AM  Result Value Ref Range   pH, Ven 7.418 7.25 - 7.43   pCO2, Ven 43.3 (L) 44 - 60 mmHg   pO2, Ven 28 (LL) 32 - 45 mmHg   Bicarbonate 28.0 20.0 - 28.0 mmol/L   TCO2 29 22 - 32 mmol/L   O2 Saturation 55 %   Acid-Base Excess 3.0 (H) 0.0 - 2.0 mmol/L   Sodium 135 135 - 145 mmol/L   Potassium 4.8 3.5 - 5.1 mmol/L   Calcium , Ion 1.23 1.15 - 1.40 mmol/L   HCT 22.0 (L) 36.0 - 46.0  %   Hemoglobin 7.5 (L) 12.0 - 15.0 g/dL   Sample type VENOUS    Comment NOTIFIED PHYSICIAN   POCT I-Stat EG7     Status: Abnormal   Collection Time: 11/29/23 10:57 AM  Result Value Ref Range   pH, Ven 7.420 7.25 - 7.43   pCO2, Ven 43.2 (L) 44 - 60 mmHg   pO2, Ven 26 (LL) 32 - 45 mmHg   Bicarbonate 28.1 (H) 20.0 - 28.0  mmol/L   TCO2 29 22 - 32 mmol/L   O2 Saturation 50 %   Acid-Base Excess 3.0 (H) 0.0 - 2.0 mmol/L   Sodium 135 135 - 145 mmol/L   Potassium 4.7 3.5 - 5.1 mmol/L   Calcium , Ion 1.22 1.15 - 1.40 mmol/L   HCT 21.0 (L) 36.0 - 46.0 %   Hemoglobin 7.1 (L) 12.0 - 15.0 g/dL   Sample type VENOUS    Comment NOTIFIED PHYSICIAN   Glucose, capillary     Status: Abnormal   Collection Time: 11/29/23 11:26 AM  Result Value Ref Range   Glucose-Capillary 169 (H) 70 - 99 mg/dL  Glucose, capillary     Status: Abnormal   Collection Time: 11/29/23  4:37 PM  Result Value Ref Range   Glucose-Capillary 228 (H) 70 - 99 mg/dL   No results found for this or any previous visit (from the past 240 hours).  Renal Function: Recent Labs    11/24/23 0841 11/25/23 0209 11/26/23 0256 11/27/23 0235 11/28/23 0545 11/29/23 0230  CREATININE 2.75* 2.31* 2.10* 2.06* 1.99* 2.43*   Estimated Creatinine Clearance: 27.3 mL/min (A) (by C-G formula based on SCr of 2.43 mg/dL (H)).  Radiologic Imaging: CARDIAC CATHETERIZATION Result Date: 11/29/2023 1. Severely elevated PCWP with prominent v-waves. 2. Mildly elevated right heart filling pressures. 3. Moderate pulmonary arterial hypertension 4.  Preserved cardiac output.   US  RENAL Result Date: 11/29/2023 CLINICAL DATA:  58 year old female with congestive heart failure. EXAM: RENAL / URINARY TRACT ULTRASOUND COMPLETE COMPARISON:  CT Abdomen and Pelvis 03/22/2023. FINDINGS: Right Kidney: Renal measurements: 10.8 x 4.4 x 4.2 cm = volume: 104 mL. No hydronephrosis or right renal mass. Cortical echogenicity is normal. Left Kidney: Renal measurements: 10.2 x  5.1 x 3.8 cm = volume: 102 mL. No left hydronephrosis or renal mass. Normal left renal cortical echogenicity. Bladder: Foley catheter balloon and irregular echogenic debris visible in the urinary bladder (image 49). Other: None. IMPRESSION: 1. Negative ultrasound appearance of both kidneys. 2. Foley catheter in place with irregular echogenic debris within the bladder. Correlate with urinalysis. Electronically Signed   By: VEAR Hurst M.D.   On: 11/29/2023 06:28   DG CHEST PORT 1 VIEW Result Date: 11/28/2023 CLINICAL DATA:  Shortness of breath EXAM: PORTABLE CHEST 1 VIEW COMPARISON:  Chest x-ray 11/28/2023 FINDINGS: Diffuse patchy multifocal airspace disease throughout both lungs has significantly increased. This spares the lung apices. There is no pleural effusion or pneumothorax. The cardiomediastinal silhouette is within normal limits. No acute fractures are seen. IMPRESSION: Increasing diffuse patchy multifocal airspace disease throughout both lungs. Electronically Signed   By: Greig Pique M.D.   On: 11/28/2023 21:25   DG CHEST PORT 1 VIEW Result Date: 11/28/2023 CLINICAL DATA:  [id number] Dyspnea [id number] EXAM: PORTABLE CHEST - 1 VIEW COMPARISON:  Multiple, most recently November 17, 2023 FINDINGS: Interval removal of the endotracheal and esophagogastric tubes and the central venous catheters. Diffuse hazy airspace disease throughout both lungs. Small right pleural effusion. No pneumothorax. Decreased consolidation in the left lung. Mild cardiomegaly. No acute fracture or destructive lesion. IMPRESSION: 1. Similar diffuse hazy airspace disease throughout both lungs with decreasing consolidation in the left lung. Trace right pleural effusion. 2. Interval removal of the support tubes and lines. Electronically Signed   By: Rogelia Myers M.D.   On: 11/28/2023 12:57    I independently reviewed the above imaging studies.  Impression/Recommendation Gross hematuria with clot retention UTI  I placed a 60F 3 way  hematuria foley catheter  and attempted to irrigate her bladder.  I was unable to irrigate the bladder.  No return of irrigant.  Bloody urine noted around catheter.  Patient had discomfort with procedure.  Foley removed.  Will plan on urgent management with cystoscopy, clot evacuation and possible fulguration of bleeding. Procedure and risk discussed with patient.  She understands and agrees to proceed.   Adine Malaiyah Achorn 11/30/2023, 7:02 AM

## 2023-11-30 NOTE — Op Note (Signed)
 OPERATIVE NOTE   Patient Name: Kayla Schmitt  MRN: [id number]   Date of Procedure: 11/30/23    Preoperative diagnosis:  Gross hematuria Clot retention  Postoperative diagnosis:  Gross hematuria Clot retention with large volume fibrinous clot filling the entire bladder  Procedure:  Cystoscopy Evacuation of clot from bladder Fulguration of bleeding  Attending: Adine DOROTHA Manly, MD  Anesthesia: General  Estimated blood loss: 500 mL of formed clot removed from bladder  Fluids: Per anesthesia record  Drains: 24 French three-way Foley catheter with continuous bladder irrigation  Specimens: Clot from bladder  Antibiotics: Rocephin  IV given prior to surgery  Findings:  - Normal urethra - Significant formed clot filling majority of bladder - No obvious bladder lesions - No evidence of bladder perforation  Indications:  58 year old female with multiple medical problems currently in ICU presents for management of gross hematuria with clot retention.  She developed gross hematuria several days ago.  Her urinalysis was suspicious for UTI.  No urine culture was obtained.  A 18 French three-way Foley catheter was placed and CBI was started.  Apparently she has not had any significant drainage with CBI.  She is leaking bloody urine around the catheter.  I attempted placement of a 32 French hematuria catheter but was unable to irrigate the bladder.  She is now taken to the operating room for urgent cystoscopy, clot evacuation, and fulguration of bleeding.  Risk and benefits of the procedure discussed with the patient.  She understands and wishes to proceed.  Description of Procedure:  The patient was on IV Rocephin  preoperatively.  She was taken to the operating room suite and properly identified.  After successful induction of a general anesthetic, she was placed in the dorsolithotomy position.  The patient's genitalia was prepped and draped in sterile fashion.  A  preoperative timeout was performed.  Cystoscopy was performed using a 21 French rigid cystoscope.  No urethral abnormalities were seen.  Immediately upon entering the bladder a large amount of formed clot was noted.  The clot appeared to fill the entire bladder making visualization difficult.  I did not appreciate any active bleeding.  I was able to identify the trigone and a single orifice was seen bilaterally.  I then placed a 26 French continuous-flow resectoscope sheath.  I attempted to irrigate the bladder and evacuate the clot but due to the fibrinous nature of the clot I was not able to evacuate the clot.  I proceeded to resect the clot using the bipolar loop.  Due to the large volume of clot, resection was quite extensive requiring a total of 4 hours of operative time.  Care was taken to avoid any injury to the bladder during resection.  There were several areas of the bladder noted to be thin possibly due to distention of the bladder.  There was no evidence of any bladder perforation.  Following resection of the clot I was able to irrigate much of the clot through the resectoscope sheath.  The remaining pieces of clot I was able to remove with the loop.  During removal of clot material, on 2 occasions the bipolar loop broke into the bladder.  Several areas with some oozing mucosa were fulgurated.  Visualization of the bladder again demonstrated no active bleeding.  There was no evidence of any injury to the ureters.  At this point the cystoscope was removed.  A 24 French three-way Foley catheter was placed.  The catheter balloon was filled with 30 mL of sterile  water .  The catheter irrigated easily.  Due to the broken loop, a x-ray was obtained at the end of the case.  A metallic loop was noted overlying the patient's right pelvic bone.  Due to the possibility of a retained loop, I repeated cystoscopy using the 21 French cystoscope.  The bladder was carefully inspected and no foreign bodies were seen.   Fluoroscopy was used during cystoscopy to identify the location of the metallic object.  It was actually found to be outside of the patient underneath her right buttock.  A repeat x-ray did not show any foreign bodies within the bladder or pelvic area.  A 24 French three-way Foley catheter was replaced and CBI was restarted.  The urine was clear with minimal CBI.  She was then taken back to the ICU in stable condition and intubated.  Complications: None  Condition: Stable, extubated, transferred to PACU  Plan:  Continue three-way Foley catheter Continue CBI to keep urine clear and hand irrigate as needed.

## 2023-11-30 NOTE — Progress Notes (Addendum)
 eLink Physician-Brief Progress Note Patient Name: Kayla Schmitt DOB: Aug 30, 1965 MRN: [id number]   Date of Service  11/30/2023  HPI/Events of Note  Having essentially no output from the Foley, leaking directly around it.  Positioning was verified and seems to be correct.  Similar experience with 57 French Foley that was then prior.  eICU Interventions  Will attempt to upsize the Foley to 22 [place] to see if the leaking resolves.  If it does not, will need to involve urology for further evaluation   [phone] -persistent hematuria in the setting of dual antibiotic therapy and UTI complicated by shock.  Will discuss case with urology about upsizing the catheter and hopefully performing CBI.  Intervention Category Minor Interventions: Routine modifications to care plan (e.g. PRN medications for pain, fever)  Kayla Schmitt 11/30/2023, 4:55 AM

## 2023-11-30 NOTE — Anesthesia Preprocedure Evaluation (Addendum)
 Anesthesia Evaluation  Patient identified by MRN, date of birth, ID band Patient awake    Reviewed: Allergy & Precautions, NPO status , Patient's Chart, lab work & pertinent test results  History of Anesthesia Complications (+) PONV  Airway Mallampati: III  TM Distance: >3 FB Neck ROM: Full    Dental  (+) Dental Advisory Given   Pulmonary former smoker Recurrent pulm edema: intubated 7/18-20 Has returned to BiPAP with pulm edema now      rales    Cardiovascular hypertension, Pt. on medications and Pt. on home beta blockers pulmonary hypertension+ CAD, + Past MI, + Peripheral Vascular Disease and +CHF  + Valvular Problems/Murmurs (severe) AS  Rhythm:Regular Rate:Tachycardia  10/2023 ECHO: EF 25 to 30%.  1. The LV has severely decreased function, global hypokinesis. The left ventricular internal cavity size was mildly dilated. Grade II diastolic dysfunction  (pseudonormalization). Elevated left atrial pressure.   2. The mitral valve is abnormal. Mild to moderate mitral valve regurgitation. Moderate mitral annular calcification.   3. There is moderate calcification of the aortic valve. There is moderate thickening of the aortic valve. Moderate to severe aortic valve stenosis. Aortic valve Vmax measures 3.21 m/s.     Neuro/Psych   Anxiety     negative neurological ROS     GI/Hepatic Neg liver ROS,GERD  Controlled,,  Endo/Other  diabetes (glu 228), Insulin  Dependent  BMI 32  Renal/GU Renal InsufficiencyRenal disease (CRRT)     Musculoskeletal  (+) Arthritis ,    Abdominal   Peds  Hematology  (+) Blood dyscrasia (Hb 7.1, plt 271k), anemia plavix    Anesthesia Other Findings   Reproductive/Obstetrics                              Anesthesia Physical Anesthesia Plan  ASA: 4 and emergent  Anesthesia Plan: General   Post-op Pain Management: Minimal or no pain anticipated   Induction:  Intravenous  PONV Risk Score and Plan: 4 or greater and Ondansetron , Dexamethasone  and Treatment may vary due to age or medical condition  Airway Management Planned: Oral ETT  Additional Equipment: ClearSight and CVP  Intra-op Plan:   Post-operative Plan: Post-operative intubation/ventilation  Informed Consent: I have reviewed the patients History and Physical, chart, labs and discussed the procedure including the risks, benefits and alternatives for the proposed anesthesia with the patient or authorized representative who has indicated his/her understanding and acceptance.     Dental advisory given  Plan Discussed with: CRNA and Surgeon  Anesthesia Plan Comments: (Discussed with pt, Dr. Rolan, pt's mother)         Anesthesia Quick Evaluation

## 2023-11-30 NOTE — Anesthesia Procedure Notes (Signed)
 Central Venous Catheter Insertion Performed by: Leonce Athens, MD, anesthesiologist Start/End8/06/2023 9:06 AM, 11/30/2023 9:19 AM Patient location: OR. Preanesthetic checklist: patient identified, IV checked, risks and benefits discussed, surgical consent, monitors and equipment checked, pre-op evaluation, timeout performed and anesthesia consent Position: supine Patient sedated Hand hygiene performed , maximum sterile barriers used  and Seldinger technique used Catheter size: 8 Fr Total catheter length 16. Central line was placed.Double lumen Procedure performed using ultrasound guided technique. Ultrasound Notes:anatomy identified, needle tip was noted to be adjacent to the nerve/plexus identified, no ultrasound evidence of intravascular and/or intraneural injection and image(s) printed for medical record Attempts: 1 Following insertion, dressing applied, line sutured and Biopatch. Post procedure assessment: blood return through all ports  Patient tolerated the procedure well with no immediate complications.

## 2023-11-30 NOTE — Progress Notes (Signed)
 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 Failed RHC 7/17 due to orthopnea. Developed gross hematuria and foley cath bladder irrigation. Hospitalist called PCCM for worsening hypoxia (3 L [place] to 6 L [place]), MAP 58 mmHg, and worsening SOB.   Pertinent  Medical History  Ischemic cardiomyopathy (CAD, EF 25%, G2DD), moderate-severe aortic stenosis, mild-moderate MR, HTN, PAD, dyslipidemia, hypothyroidism, DM-2, CKD-3a, GERD, Fe def anemia, nec fasc s/p left BKA  Significant Hospital Events: Including procedures, antibiotic start and stop dates in addition to other pertinent events   7/16 admission 7/17 ICU, intubated 7/18 started spiking fever with Tmax 101.5, tachycardic.  X-ray chest still showing bilateral infiltrate but FiO2 is coming down.  Lasix  infusion increased to 30 mg/h 7/19 started on CRRT, tolerating spontaneous breathing trial 7/20 extubated 7/22 CRRT holiday, Levophed /dobutamine  7/23 lasix  challenge, transfused 1u PRBC, remains low dose NE, DBA 2.5, CVP 7 7/24 laisx, off DBA 7/25: PCCM signed off 7/31: PCCM was called for eval. PCXR: worse pulm edema and bilateral pleural effusions. Negative 1.6 L fluid balance in the last 3 days 8/2 started with hematuria, Foley is leaking, urology unable to irrigate the bladder  Interim History / Subjective:   Patient started with gross hematuria, urology is unable to irrigate the bladder, going to OR for cystoscopy and evacuation of and possible fulguration of bleeding Currently on BiPAP Yesterday she underwent right heart catheter which showed elevated pressures with preserved cardiac output  Objective    Blood pressure (!) 118/90, pulse 95, temperature 98.1 F (36.7 C), temperature source Axillary, resp. rate (!) 22, height 5' 4 (1.626 m), weight 85.3 kg, SpO2 98%.    Vent Mode: PCV FiO2 (%):  [40 %] 40 % Set Rate:  [15 bmp] 15 bmp PEEP:  [5 cmH20] 5 cmH20   Intake/Output Summary (Last 24 hours) at 11/30/2023 0746 Last data filed at 11/29/2023 2230 Gross per 24 hour  Intake 473.94 ml  Output 550 ml  Net -76.06 ml   Filed Weights   11/26/23 0500 11/27/23 0437 11/29/23 0500  Weight: 86.7 kg 84.4 kg 85.3 kg    Examination: General: Acute on chronically ill-appearing female, lying on the bed HEENT: /AT, eyes anicteric.  moist mucus membranes.  Currently on BiPAP Neuro: Alert, awake following commands Chest: Bilateral basal crackles,, no wheezes or rhonchi Heart: Regular rate and rhythm, ejection systolic murmur best heard in aortic area Abdomen: Soft, nontender, nondistended, bowel sounds present Extremities: Status post bilateral BKA  Labs and images reviewed  Patient Lines/Drains/Airways Status     Active Line/Drains/Airways     Name Placement date Placement time Site Days   Peripheral IV 11/21/23 22 G 1.75 Anterior;Right Forearm 11/21/23  0422  Forearm  8   Urethral Catheter Harlene Quant, RN Triple-lumen 18 Fr. 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 Moderate-severe aortic stenosis, mild-moderate MR Multivessel coronary artery disease Patient became hypoxic overnight, she is back on BiPAP Going to the OR where she will be intubated for cystoscopy and evacuation of clots from bladder Will resume diuretics once she comes back from the OR Advanced heart failure team is following She is not a candidate for TAVR GDMT is limited by borderline hypotension Continue midodrine  Patient will need CABG and SAVR evaluation once clinically stable  Hypothyroidism Continue levothyroxine   DM-2 (HbA1c 5.5%) Patient blood sugars not well-controlled Continue sliding scale and Lantus , closely monitor fingerstick with goal 140-180 Holding of increasing insulin  considering patient is going to the OR  AKI on CKD-3a due to cardiorenal syndrome Hematuria, probably traumatic Him creatinine is uptrending, repeat labs are pending this morning Nephrology is following She required CRRT for few days Probably she will end up on CRRT again Appreciate urology follow-up, unable to irrigate the bladder She is going to OR for cystoscopy and evacuation of clots  Acute urinary tract infection Patient complaining of dysuria day before yesterday Her UA showed many bacteria's Continue ceftriaxone  to complete 5 days therapy Repeat UA is pending  Anemia of chronic disease Patient's iron  studies are not consistent with iron  deficiency Monitor H&H and transfuse if less than 7 Her hemoglobin is 7.1, will transfuse 1 unit PRBC    Best Practice (right click and Reselect all SmartList Selections daily)   Diet/type: NPO DVT prophylaxis SCD Pressure ulcer(s): Please see nursing notes GI prophylaxis: H2B Lines: N/A Foley:  Yes, and it is still needed Code Status:  full code Last date of multidisciplinary goals of care discussion [8/1: Patient and her mother both were updated at bedside,  decision was to continue full scope of care]  Labs   CBC: Recent Labs  Lab 11/27/23 0235 11/27/23 1122 11/27/23 1342 11/28/23 0545 11/28/23 1501 11/29/23 0230 11/29/23 1056 11/29/23 1057  WBC 16.6*  --  18.9* 18.2* 29.2* 21.0*  --   --   NEUTROABS  --   --   --   --  23.7* 14.4*  --   --   HGB 7.3*   < > 7.9* 7.2* 7.9* 7.5* 7.5* 7.1*  HCT 23.4*   < > 24.3* 22.5* 25.1* 23.8* 22.0* 21.0*  MCV 91.4  --  90.7 90.7 90.6 92.6  --   --   PLT 218  --  233 243 361 271  --   --    < > = values in this interval not displayed.    Basic Metabolic Panel: Recent Labs  Lab 11/24/23 0841 11/25/23 0209 11/26/23 0256 11/27/23 0235 11/28/23 0545 11/29/23 0230 11/29/23 1056 11/29/23 1057  NA 132* 134* 135 134* 136 136 135 135  K 3.8 4.0 3.5 3.6 3.5 4.6 4.8 4.7  CL 96* 97* 96* 96* 96* 97*  --   --   CO2 22 25 25 26 26 24   --   --   GLUCOSE 201* 154* 148* 166* 153* 211*  --   --   BUN 79* 91* 93* 93* 92* 103*  --   --   CREATININE 2.75* 2.31* 2.10* 2.06* 1.99* 2.43*  --   --  CALCIUM  8.8* 8.7* 8.8* 9.0 9.2 9.5  --   --   MG 1.9 1.6* 2.5* 2.0  --  2.0  --   --   PHOS 4.2 3.4 3.1 2.6  --   --   --   --    GFR: Estimated Creatinine Clearance: 27.3 mL/min (A) (by C-G formula based on SCr of 2.43 mg/dL (H)). Recent Labs  Lab 11/27/23 1342 11/28/23 0545 11/28/23 1501 11/29/23 0230  PROCALCITON  --   --   --  0.88  WBC 18.9* 18.2* 29.2* 21.0*  LATICACIDVEN  --  0.9  --   --     Liver Function Tests: Recent Labs  Lab 11/24/23 0841 11/25/23 0209 11/26/23 0256 11/27/23 0235  ALBUMIN  2.6* 2.6* 2.7* 2.8*   No results for input(s): LIPASE, AMYLASE in the last 168 hours. No results for input(s): AMMONIA in the last 168 hours.  ABG    Component Value Date/Time   PHART 7.343 (L) 11/15/2023 0819   PCO2ART 45.2 11/15/2023 0819   PO2ART 117 (H) 11/15/2023 0819   HCO3 28.1 (H) 11/29/2023 1057   TCO2 29 11/29/2023 1057   ACIDBASEDEF 1.0 11/15/2023 0819   O2SAT 50 11/29/2023  1057     Coagulation Profile: No results for input(s): INR, PROTIME in the last 168 hours.  Cardiac Enzymes: No results for input(s): CKTOTAL, CKMB, CKMBINDEX, TROPONINI in the last 168 hours.  HbA1C: Hgb A1c MFr Bld  Date/Time Value Ref Range Status  10/28/2023 07:40 PM 5.5 4.8 - 5.6 % Final    Comment:    (NOTE) Diagnosis of Diabetes The following HbA1c ranges recommended by the American Diabetes Association (ADA) may be used as an aid in the diagnosis of diabetes mellitus.  Hemoglobin             Suggested A1C NGSP%              Diagnosis  <5.7                   Non Diabetic  5.7-6.4                Pre-Diabetic  >6.4                   Diabetic  <7.0                   Glycemic control for                       adults with diabetes.    08/02/2022 08:48 PM 6.4 (H) 4.8 - 5.6 % Final    Comment:    (NOTE)         Prediabetes: 5.7 - 6.4         Diabetes: >6.4         Glycemic control for adults with diabetes: <7.0     CBG: Recent Labs  Lab 11/29/23 0621 11/29/23 0800 11/29/23 1126 11/29/23 1637 11/30/23 0741  GLUCAP 173* 193* 169* 228* 204*    The patient is critically ill due to acute respiratory failure due to recurrent pulmonary edema/acute on chronic HFpEF.  Critical care was necessary to treat or prevent imminent or life-threatening deterioration.  Critical care was time spent personally by me on the following activities: development of treatment plan with patient and/or surrogate as well as nursing, discussions with consultants, evaluation of patient's response to treatment, examination of patient, obtaining history from patient or surrogate, ordering  and performing treatments and interventions, ordering and review of laboratory studies, ordering and review of radiographic studies, pulse oximetry, re-evaluation of patient's condition and participation in multidisciplinary rounds.   During this encounter critical care time was devoted to patient  care services described in this note for 39 minutes.     Valinda Novas, MD [facility] See Amion for pager If no response to pager, please call [phone] until 7pm After 7pm, Please call E-link [phone]

## 2023-11-30 NOTE — Progress Notes (Signed)
 Patient ID: Kayla Schmitt, female   DOB: December 11, 1965, 58 y.o.   MRN: [id number] [facility] Progress Note   Assessment/ Plan:   1. Acute kidney Injury on chronic kidney disease stage III (baseline creatinine appears 1.3-1.6): Hemodynamically mediated in the setting of acute exacerbation of congestive heart failure, transiently on CRRT that was d/c on 7/22.  Had been transitioned from IV furosemide  80 mg to torsemide  80 mg twice daily on 7/27 (AM dose only on 7/28) but will fortunately became acutely short of breath on 7/31 with not surprisingly worsening renal function.  May still require CRRT in the near future (would still need a temporary dialysis catheter).  No absolute indication today.  She already went to significant evaluation for hematuria with significant abdominal thrombus evacuated by urology currently with a three-way Foley.  I discussed with the nurse and she will keep close I's and O's, irrigation rate already decreased as the urine is already clearing up.  Will be interesting how much urine she makes now that all blood clots have been evaculated.  2.  Acute exacerbation of congestive heart failure/cardiogenic shock: History of congestive heart failure with reduced ejection fraction and decompensation noted during this hospitalization.  Refractory to diuretics in the setting of inotropic/pressor support and started on CRRT transiently for extracorporeal volume unloading.  Urine output decreasing trend with hypotension as well but certainly obstruction is a bladder because of the massive amount of blood clots many of which were well-formed per urology 3.  Hyponatremia: Hypervolemic hyponatremia in the setting of acute kidney injury/CHF exacerbation, monitor with diuresis and renal recovery. 4.  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  Continue antibiotics per primary service. 5.  Anemia:  Secondary to critical illness and prior history of GI bleed.  PRBC transfusion done yesterday with stable H/H overnight. 6. UTI: started on Abx on 7/30 with hematuria as well  Subjective:   Currently intubated, mother bedside updated.      Objective:   BP 119/68   Pulse 74   Temp 98.1 F (36.7 C) (Axillary)   Resp 16   Ht 5' 4 (1.626 m)   Wt 85.3 kg   SpO2 100%   BMI 32.28 kg/m   Intake/Output Summary (Last 24 hours) at 11/30/2023 1554 Last data filed at 11/30/2023 1451 Gross per 24 hour  Intake 2071.97 ml  Output 80 ml  Net 1991.97 ml   Weight change:   Physical Exam: Gen: intubated CVS: Pulse regular rhythm, normal rate S1 and S2 normal Resp: on vent Abd: Soft, obese, nontender, bowel sounds normal Ext: Status post left BKA and right forefoot amputation.  Trace edema over dependent areas.. GU: Urine already starting to clear rapidly, currently slight pink tinge with no clots  Imaging: DG C-Arm 1-60 Min Result Date: 11/30/2023 CLINICAL DATA:  Cystoscopy EXAM: DG C-ARM 1-60 MIN COMPARISON:  None Available. FINDINGS: A single fluoroscopic image was obtained during the performance of the procedure and is provided for interpretation only. Image is obtained over the lower pelvis, with no gross radiographic abnormality identified. Please refer to the operative report. Fluoroscopy time: 23.6 seconds, 6.8 mGy IMPRESSION: 1. Intraoperative evaluation as above. Please refer to the operative report. Electronically Signed   By: Ozell Daring M.D.   On: 11/30/2023 15:17   DG Pelvis Portable Result Date: 11/30/2023 CLINICAL DATA:  [id number] Surgery, elective [id number]. EXAM: PORTABLE PELVIS 1-2 VIEWS COMPARISON:  Radiograph from earlier the same  day. FINDINGS: Interval disappearance of previously seen semi circular thin metallic foreign body. No other radiopaque foreign body seen. IMPRESSION: *Interval disappearance of previously seen semi circular thin metallic foreign body. No other radiopaque  foreign body. Critical Value/emergent results were called by telephone at the time of interpretation on 11/30/2023 at 2:28 pm to provider ADINE MANLY , who verbally acknowledged these results. Electronically Signed   By: Ree Molt M.D.   On: 11/30/2023 14:29   DG Pelvis 1-2 Views Result Date: 11/30/2023 CLINICAL DATA:  [id number] Surgery, elective [id number]. EXAM: PELVIS - 1-2 VIEW COMPARISON:  None Available. FINDINGS: The provided image demonstrates An approximately 5-6 mm long half circular, thin metallic foreign body overlying the right side of the pubis near the pubic symphysis. No other radiopaque foreign bodies. Vascular stent noted overlying the left superior thigh region. IMPRESSION: *There is a single metallic foreign body overlying the right side of the pubis. Critical Value/emergent results were called by telephone at the time of interpretation on 11/30/2023 at 1:47 pm to provider ADINE MANLY , who verbally acknowledged these results. Electronically Signed   By: Ree Molt M.D.   On: 11/30/2023 13:48   CARDIAC CATHETERIZATION Result Date: 11/29/2023 1. Severely elevated PCWP with prominent v-waves. 2. Mildly elevated right heart filling pressures. 3. Moderate pulmonary arterial hypertension 4.  Preserved cardiac output.   US  RENAL Result Date: 11/29/2023 CLINICAL DATA:  58 year old female with congestive heart failure. EXAM: RENAL / URINARY TRACT ULTRASOUND COMPLETE COMPARISON:  CT Abdomen and Pelvis 03/22/2023. FINDINGS: Right Kidney: Renal measurements: 10.8 x 4.4 x 4.2 cm = volume: 104 mL. No hydronephrosis or right renal mass. Cortical echogenicity is normal. Left Kidney: Renal measurements: 10.2 x 5.1 x 3.8 cm = volume: 102 mL. No left hydronephrosis or renal mass. Normal left renal cortical echogenicity. Bladder: Foley catheter balloon and irregular echogenic debris visible in the urinary bladder (image 49). Other: None. IMPRESSION: 1. Negative ultrasound appearance of both  kidneys. 2. Foley catheter in place with irregular echogenic debris within the bladder. Correlate with urinalysis. Electronically Signed   By: VEAR Hurst M.D.   On: 11/29/2023 06:28   DG CHEST PORT 1 VIEW Result Date: 11/28/2023 CLINICAL DATA:  Shortness of breath EXAM: PORTABLE CHEST 1 VIEW COMPARISON:  Chest x-ray 11/28/2023 FINDINGS: Diffuse patchy multifocal airspace disease throughout both lungs has significantly increased. This spares the lung apices. There is no pleural effusion or pneumothorax. The cardiomediastinal silhouette is within normal limits. No acute fractures are seen. IMPRESSION: Increasing diffuse patchy multifocal airspace disease throughout both lungs. Electronically Signed   By: Greig Pique M.D.   On: 11/28/2023 21:25     Labs: BMET Recent Labs  Lab 11/24/23 0841 11/25/23 0209 11/26/23 0256 11/27/23 0235 11/28/23 0545 11/29/23 0230 11/29/23 1056 11/29/23 1057 11/30/23 0739 11/30/23 1104 11/30/23 1204 11/30/23 1348  NA 132* 134* 135 134* 136 136 135 135 137 136 136 137  K 3.8 4.0 3.5 3.6 3.5 4.6 4.8 4.7 4.7 4.5 4.3 4.4  CL 96* 97* 96* 96* 96* 97*  --   --  96*  --   --   --   CO2 22 25 25 26 26 24   --   --  21*  --   --   --   GLUCOSE 201* 154* 148* 166* 153* 211*  --   --  198*  --   --   --   BUN 79* 91* 93* 93* 92* 103*  --   --  120*  --   --   --   CREATININE 2.75* 2.31* 2.10* 2.06* 1.99* 2.43*  --   --  2.50*  --   --   --   CALCIUM  8.8* 8.7* 8.8* 9.0 9.2 9.5  --   --  9.6  --   --   --   PHOS 4.2 3.4 3.1 2.6  --   --   --   --   --   --   --   --    CBC Recent Labs  Lab 11/28/23 0545 11/28/23 1501 11/29/23 0230 11/29/23 1056 11/30/23 0739 11/30/23 1104 11/30/23 1204 11/30/23 1348  WBC 18.2* 29.2* 21.0*  --  16.4*  --   --   --   NEUTROABS  --  23.7* 14.4*  --   --   --   --   --   HGB 7.2* 7.9* 7.5*   < > 7.9* 8.2* 7.8* 8.8*  HCT 22.5* 25.1* 23.8*   < > 25.8* 24.0* 23.0* 26.0*  MCV 90.7 90.6 92.6  --  95.6  --   --   --   PLT 243 361 271   --  334  --   --   --    < > = values in this interval not displayed.    Medications:     sodium chloride    Intravenous Once   atorvastatin   40 mg Oral Daily   bethanechol   10 mg Oral TID   Chlorhexidine  Gluconate Cloth  6 each Topical Daily   feeding supplement  237 mL Oral Q1400   ferrous sulfate   325 mg Oral Q breakfast   gabapentin   300 mg Oral QHS   hydrocortisone  cream   Topical TID   insulin  aspart  0-15 Units Subcutaneous TID WC   insulin  glargine-yfgn  6 Units Subcutaneous BID   levothyroxine   150 mcg Oral Q0600   midodrine   5 mg Oral Q8H   multivitamin with minerals  1 tablet Oral Daily   nutrition supplement (JUVEN)  1 packet Oral BID AC & HS   pantoprazole   40 mg Oral Daily   sodium chloride  flush  10-40 mL Intracatheter Q12H

## 2023-12-01 ENCOUNTER — Inpatient Hospital Stay (HOSPITAL_COMMUNITY)

## 2023-12-01 LAB — BASIC METABOLIC PANEL WITH GFR
Anion gap: 15 (ref 5–15)
Anion gap: 15 (ref 5–15)
BUN: 111 mg/dL — ABNORMAL HIGH (ref 6–20)
BUN: 111 mg/dL — ABNORMAL HIGH (ref 6–20)
CO2: 22 mmol/L (ref 22–32)
CO2: 23 mmol/L (ref 22–32)
Calcium: 8.7 mg/dL — ABNORMAL LOW (ref 8.9–10.3)
Calcium: 8.8 mg/dL — ABNORMAL LOW (ref 8.9–10.3)
Chloride: 101 mmol/L (ref 98–111)
Chloride: 102 mmol/L (ref 98–111)
Creatinine, Ser: 2.12 mg/dL — ABNORMAL HIGH (ref 0.44–1.00)
Creatinine, Ser: 2.08 mg/dL — ABNORMAL HIGH (ref 0.44–1.00)
GFR, Estimated: 27 mL/min — ABNORMAL LOW (ref >60)
GFR, Estimated: 27 mL/min — ABNORMAL LOW (ref >60)
Glucose, Bld: 179 mg/dL — ABNORMAL HIGH (ref 70–99)
Glucose, Bld: 103 mg/dL — ABNORMAL HIGH (ref 70–99)
Potassium: 4.1 mmol/L (ref 3.5–5.1)
Potassium: 4 mmol/L (ref 3.5–5.1)
Sodium: 138 mmol/L (ref 135–145)
Sodium: 140 mmol/L (ref 135–145)

## 2023-12-01 LAB — CBC
HCT: 28.2 % — ABNORMAL LOW (ref 36.0–46.0)
Hemoglobin: 8.7 g/dL — ABNORMAL LOW (ref 12.0–15.0)
MCH: 29.9 pg (ref 26.0–34.0)
MCHC: 30.9 g/dL (ref 30.0–36.0)
MCV: 96.9 fL (ref 80.0–100.0)
Platelets: 248 K/uL (ref 150–400)
RBC: 2.91 MIL/uL — ABNORMAL LOW (ref 3.87–5.11)
RDW: 22.1 % — ABNORMAL HIGH (ref 11.5–15.5)
WBC: 13.9 K/uL — ABNORMAL HIGH (ref 4.0–10.5)
nRBC: 0.2 % (ref 0.0–0.2)

## 2023-12-01 LAB — BPAM RBC
Blood Product Expiration Date: 202508162359
Blood Product Expiration Date: 202508272359
ISSUE DATE / TIME: 202508020833
ISSUE DATE / TIME: 202508020833
Unit Type and Rh: 5100
Unit Type and Rh: 5100

## 2023-12-01 LAB — POCT I-STAT 7, (LYTES, BLD GAS, ICA,H+H)
Acid-base deficit: 1 mmol/L (ref 0.0–2.0)
Bicarbonate: 24.7 mmol/L (ref 20.0–28.0)
Calcium, Ion: 1.12 mmol/L — ABNORMAL LOW (ref 1.15–1.40)
HCT: 26 % — ABNORMAL LOW (ref 36.0–46.0)
Hemoglobin: 8.8 g/dL — ABNORMAL LOW (ref 12.0–15.0)
O2 Saturation: 99 %
Patient temperature: 97.9
Potassium: 4.1 mmol/L (ref 3.5–5.1)
Sodium: 138 mmol/L (ref 135–145)
TCO2: 26 mmol/L (ref 22–32)
pCO2 arterial: 41.8 mmHg (ref 32–48)
pH, Arterial: 7.379 (ref 7.35–7.45)
pO2, Arterial: 119 mmHg — ABNORMAL HIGH (ref 83–108)

## 2023-12-01 LAB — TYPE AND SCREEN
ABO/RH(D): O POS
Antibody Screen: NEGATIVE
Unit division: 0
Unit division: 0

## 2023-12-01 LAB — GLUCOSE, CAPILLARY
Glucose-Capillary: 107 mg/dL — ABNORMAL HIGH (ref 70–99)
Glucose-Capillary: 107 mg/dL — ABNORMAL HIGH (ref 70–99)
Glucose-Capillary: 109 mg/dL — ABNORMAL HIGH (ref 70–99)
Glucose-Capillary: 168 mg/dL — ABNORMAL HIGH (ref 70–99)
Glucose-Capillary: 170 mg/dL — ABNORMAL HIGH (ref 70–99)
Glucose-Capillary: 172 mg/dL — ABNORMAL HIGH (ref 70–99)
Glucose-Capillary: 91 mg/dL (ref 70–99)

## 2023-12-01 LAB — COOXEMETRY PANEL
Carboxyhemoglobin: 2.3 % — ABNORMAL HIGH (ref 0.5–1.5)
Methemoglobin: 2.9 % — ABNORMAL HIGH (ref 0.0–1.5)
O2 Saturation: 78.2 %
Total hemoglobin: 9.7 g/dL — ABNORMAL LOW (ref 12.0–16.0)

## 2023-12-01 LAB — TRIGLYCERIDES: Triglycerides: 257 mg/dL — ABNORMAL HIGH (ref <150)

## 2023-12-01 MED ORDER — SODIUM CHLORIDE 0.9 % IR SOLN
3000.0000 mL | Status: DC
  Administered 2023-12-01: 3000 mL

## 2023-12-01 MED ORDER — ASPIRIN 81 MG PO CHEW
81.0000 mg | CHEWABLE_TABLET | Freq: Every day | ORAL | Status: DC
  Administered 2023-12-01 – 2023-12-04 (×3): 81 mg
  Filled 2023-12-01 (×3): qty 1

## 2023-12-01 MED ORDER — GERHARDT'S BUTT CREAM
TOPICAL_CREAM | Freq: Two times a day (BID) | CUTANEOUS | Status: DC
  Administered 2023-12-01: 1 via TOPICAL
  Filled 2023-12-01: qty 60

## 2023-12-01 MED ORDER — POTASSIUM CHLORIDE 10 MEQ/100ML IV SOLN
10.0000 meq | INTRAVENOUS | Status: AC
  Administered 2023-12-01 (×2): 10 meq via INTRAVENOUS
  Filled 2023-12-01: qty 100

## 2023-12-01 MED ORDER — METOLAZONE 5 MG PO TABS
5.0000 mg | ORAL_TABLET | Freq: Once | ORAL | Status: AC
  Administered 2023-12-01: 5 mg
  Filled 2023-12-01: qty 1

## 2023-12-01 NOTE — Progress Notes (Signed)
 Patient ID: Kayla Schmitt, female   DOB: Sep 18, 1965, 58 y.o.   MRN: [id number] [facility] Progress Note   Assessment/ Plan:   1. Acute kidney Injury on chronic kidney disease stage III (baseline creatinine appears 1.3-1.6): Hemodynamically mediated in the setting of acute exacerbation of congestive heart failure, transiently on CRRT that was d/c on 7/22.  Had been transitioned from IV furosemide  80 mg to torsemide  80 mg twice daily on 7/27 (AM dose only on 7/28) but will fortunately became acutely short of breath on 7/31 with not surprisingly worsening renal function.  May still require CRRT in the near future (would still need a temporary dialysis catheter).  S/P evacuation by urology on 8/2 with a very large amount of thrombus in the bladder currently with a three-way Foley and urine has cleared up.  According to the I's and O's she put out 2 L of urine over the past 24 hours which is likely in a shorter period time than that given that she did not come out of the OR till late morning on Saturday.  CVP17 -> would like to hold CRRT, continue diuresis for another 24 hours; renal function slightly improved after  evacuation of large thrombus in the bladder.    2.  Acute exacerbation of congestive heart failure/cardiogenic shock: History of congestive heart failure with reduced ejection fraction and decompensation noted during this hospitalization.  Refractory to diuretics in the setting of inotropic/pressor support and started on CRRT transiently for extracorporeal volume unloading.  Urine output decreasing trend with hypotension as well but certainly obstruction is a bladder because of the massive amount of blood clots many of which were well-formed per urology -> UOP incr after evacuation. 3.  Hyponatremia: Hypervolemic hyponatremia in the setting of acute kidney injury/CHF exacerbation, monitor with diuresis and renal recovery. 4.  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  Continue antibiotics per primary service. 5.  Anemia: Secondary to critical illness and prior history of GI bleed.  PRBC transfusion done yesterday with stable H/H overnight. 6. UTI: started on Abx on 7/30 with hematuria as well  Subjective:   Currently intubated, mother bedside updated.   Urine clear    Objective:   BP 119/68   Pulse 78   Temp 97.9 F (36.6 C) (Axillary)   Resp (!) 23   Ht 5' 4 (1.626 m)   Wt 88.2 kg   SpO2 98%   BMI 33.38 kg/m   Intake/Output Summary (Last 24 hours) at 12/01/2023 0717 Last data filed at 12/01/2023 0700 Gross per 24 hour  Intake 4248.58 ml  Output 2250 ml  Net 1998.58 ml   Weight change:   Physical Exam: Gen: intubated CVS: Pulse regular rhythm, normal rate S1 and S2 normal Resp: on vent Abd: Soft, obese, nontender, bowel sounds normal Ext: Status post left BKA and right forefoot amputation.  Trace edema over dependent areas.. GU: Urine clear from three-way Foley   Imaging: DG Abd 1 View Result Date: 11/30/2023 CLINICAL DATA:  Nasogastric tube placement. EXAM: ABDOMEN - 1 VIEW COMPARISON:  11/19/2023 FINDINGS: Nasogastric tube is present with tip and side-port over the stomach in the left mid to upper abdomen. Bowel gas pattern is nonobstructive. There is a paucity bowel gas visualized. No free peritoneal air. Remainder of the exam is unchanged. IMPRESSION: 1. Nonobstructive bowel gas pattern. 2. Nasogastric tube with tip and side-port over the stomach in the left mid to upper  abdomen. Electronically Signed   By: Toribio Agreste M.D.   On: 11/30/2023 17:30   DG CHEST PORT 1 VIEW Result Date: 11/30/2023 CLINICAL DATA:  Central line placement EXAM: PORTABLE CHEST 1 VIEW COMPARISON:  11/28/2023 FINDINGS: Single frontal view of the chest demonstrates endotracheal tube overlying tracheal air column, tip approximately 3 cm above carina. Enteric catheter passes below diaphragm, tip  excluded by collimation. Left internal jugular catheter tip overlies the SVC. Cardiac silhouette is stable. Widespread interstitial and ground-glass opacities are again seen throughout the lungs. Trace bilateral effusions again noted. No pneumothorax. IMPRESSION: 1. Support devices as above. 2. Multifocal bilateral airspace disease and trace bilateral effusions, without significant change. Electronically Signed   By: Ozell Daring M.D.   On: 11/30/2023 16:26   DG C-Arm 1-60 Min Result Date: 11/30/2023 CLINICAL DATA:  Cystoscopy EXAM: DG C-ARM 1-60 MIN COMPARISON:  None Available. FINDINGS: A single fluoroscopic image was obtained during the performance of the procedure and is provided for interpretation only. Image is obtained over the lower pelvis, with no gross radiographic abnormality identified. Please refer to the operative report. Fluoroscopy time: 23.6 seconds, 6.8 mGy IMPRESSION: 1. Intraoperative evaluation as above. Please refer to the operative report. Electronically Signed   By: Ozell Daring M.D.   On: 11/30/2023 15:17   DG Pelvis Portable Result Date: 11/30/2023 CLINICAL DATA:  [id number] Surgery, elective [id number]. EXAM: PORTABLE PELVIS 1-2 VIEWS COMPARISON:  Radiograph from earlier the same day. FINDINGS: Interval disappearance of previously seen semi circular thin metallic foreign body. No other radiopaque foreign body seen. IMPRESSION: *Interval disappearance of previously seen semi circular thin metallic foreign body. No other radiopaque foreign body. Critical Value/emergent results were called by telephone at the time of interpretation on 11/30/2023 at 2:28 pm to provider ADINE MANLY , who verbally acknowledged these results. Electronically Signed   By: Ree Molt M.D.   On: 11/30/2023 14:29   DG Pelvis 1-2 Views Result Date: 11/30/2023 CLINICAL DATA:  [id number] Surgery, elective [id number]. EXAM: PELVIS - 1-2 VIEW COMPARISON:  None Available. FINDINGS: The provided image demonstrates An  approximately 5-6 mm long half circular, thin metallic foreign body overlying the right side of the pubis near the pubic symphysis. No other radiopaque foreign bodies. Vascular stent noted overlying the left superior thigh region. IMPRESSION: *There is a single metallic foreign body overlying the right side of the pubis. Critical Value/emergent results were called by telephone at the time of interpretation on 11/30/2023 at 1:47 pm to provider ADINE MANLY , who verbally acknowledged these results. Electronically Signed   By: Ree Molt M.D.   On: 11/30/2023 13:48   CARDIAC CATHETERIZATION Result Date: 11/29/2023 1. Severely elevated PCWP with prominent v-waves. 2. Mildly elevated right heart filling pressures. 3. Moderate pulmonary arterial hypertension 4.  Preserved cardiac output.     Labs: BMET Recent Labs  Lab 11/24/23 0841 11/25/23 0209 11/26/23 0256 11/27/23 0235 11/28/23 0545 11/29/23 0230 11/29/23 1056 11/30/23 0739 11/30/23 1104 11/30/23 1204 11/30/23 1348 11/30/23 1607 11/30/23 1710 12/01/23 0408 12/01/23 0410  NA 132* 134* 135 134* 136 136   < > 137 136 136 137 139 139 138 138  K 3.8 4.0 3.5 3.6 3.5 4.6   < > 4.7 4.5 4.3 4.4 3.9 3.9 4.1 4.1  CL 96* 97* 96* 96* 96* 97*  --  96*  --   --   --   --   --   --  101  CO2 22 25 25 26 26 24   --  21*  --   --   --   --   --   --  22  GLUCOSE 201* 154* 148* 166* 153* 211*  --  198*  --   --   --   --   --   --  179*  BUN 79* 91* 93* 93* 92* 103*  --  120*  --   --   --   --   --   --  111*  CREATININE 2.75* 2.31* 2.10* 2.06* 1.99* 2.43*  --  2.50*  --   --   --   --   --   --  2.12*  CALCIUM  8.8* 8.7* 8.8* 9.0 9.2 9.5  --  9.6  --   --   --   --   --   --  8.7*  PHOS 4.2 3.4 3.1 2.6  --   --   --   --   --   --   --   --   --   --   --    < > = values in this interval not displayed.   CBC Recent Labs  Lab 11/28/23 1501 11/29/23 0230 11/29/23 1056 11/30/23 0739 11/30/23 1104 11/30/23 1607 11/30/23 1710  12/01/23 0408 12/01/23 0410  WBC 29.2* 21.0*  --  16.4*  --   --   --   --  13.9*  NEUTROABS 23.7* 14.4*  --   --   --   --   --   --   --   HGB 7.9* 7.5*   < > 7.9*   < > 9.5* 9.5* 8.8* 8.7*  HCT 25.1* 23.8*   < > 25.8*   < > 28.0* 28.0* 26.0* 28.2*  MCV 90.6 92.6  --  95.6  --   --   --   --  96.9  PLT 361 271  --  334  --   --   --   --  248   < > = values in this interval not displayed.    Medications:     sodium chloride    Intravenous Once   atorvastatin   40 mg Oral Daily   bethanechol   10 mg Oral TID   Chlorhexidine  Gluconate Cloth  6 each Topical Daily   feeding supplement  237 mL Oral Q1400   ferrous sulfate   325 mg Oral Q breakfast   gabapentin   300 mg Oral QHS   hydrocortisone  cream   Topical TID   insulin  aspart  0-15 Units Subcutaneous Q4H   insulin  glargine-yfgn  6 Units Subcutaneous BID   levothyroxine   150 mcg Oral Q0600   midodrine   5 mg Oral Q8H   multivitamin with minerals  1 tablet Oral Daily   nutrition supplement (JUVEN)  1 packet Oral BID AC & HS   mouth rinse  15 mL Mouth Rinse Q2H   pantoprazole   40 mg Oral Daily   sodium chloride  flush  10-40 mL Intracatheter Q12H

## 2023-12-01 NOTE — Progress Notes (Signed)
 NAME:  Kayla Schmitt, MRN:  [id number], DOB:  [birth year], LOS: 19 ADMISSION DATE:  11/12/2023, CONSULTATION DATE: 11/28/2023 REFERRING MD: Franky Redia SAILOR, MD, CHIEF COMPLAINT: Hypoxia and hypotension    History of Present Illness:  A 58 year old female with ischemic cardiomyopathy (CAD, EF 25%, G2DD), moderate-severe aortic stenosis, mild-moderate MR, HTN, PAD, dyslipidemia, hypothyroidism, DM-2 (HbA1c 5.5%), CKD-3a, GERD, Fe def anemia, UTI on Rx 11/27/2023, PNA s/p Abx course for 7 days, AKI (s/p CRRT 7/19-22/2025), short term intubation (7/17-20, 2025), and recent nec fasc s/p left BKA 6/25 who was in CIR then developed worsening SOB so admitted back to [facility] 11/12/2023. Failed RHC 7/17 due to orthopnea. Developed gross hematuria and foley cath bladder irrigation. Hospitalist called PCCM for worsening hypoxia (3 L [place] to 6 L [place]), MAP 58 mmHg, and worsening SOB.   Pertinent  Medical History  Ischemic cardiomyopathy (CAD, EF 25%, G2DD), moderate-severe aortic stenosis, mild-moderate MR, HTN, PAD, dyslipidemia, hypothyroidism, DM-2, CKD-3a, GERD, Fe def anemia, nec fasc s/p left BKA  Significant Hospital Events: Including procedures, antibiotic start and stop dates in addition to other pertinent events   7/16 admission 7/17 ICU, intubated 7/18 started spiking fever with Tmax 101.5, tachycardic.  X-ray chest still showing bilateral infiltrate but FiO2 is coming down.  Lasix  infusion increased to 30 mg/h 7/19 started on CRRT, tolerating spontaneous breathing trial 7/20 extubated 7/22 CRRT holiday, Levophed /dobutamine  7/23 lasix  challenge, transfused 1u PRBC, remains low dose NE, DBA 2.5, CVP 7 7/24 laisx, off DBA 7/25: PCCM signed off 7/31: PCCM was called for eval. PCXR: worse pulm edema and bilateral pleural effusions. Negative 1.6 L fluid balance in the last 3 days 8/2 started with hematuria, Foley is leaking, urology unable to irrigate the bladder  Interim History / Subjective:   Patient went to the OR, underwent cystoscopy which showed large clot in urinary bladder which was evacuated Now she is on continuous irrigation Came off of vasopressor support Serum creatinine is improving  Objective    Blood pressure 119/68, pulse 78, temperature 97.9 F (36.6 C), temperature source Axillary, resp. rate (!) 23, height 5' 4 (1.626 m), weight 88.2 kg, SpO2 98%. CVP:  [5 mmHg-17 mmHg] 11 mmHg  Vent Mode: PRVC FiO2 (%):  [50 %] 50 % Set Rate:  [16 bmp-22 bmp] 22 bmp Vt Set:  [430 mL] 430 mL PEEP:  [5 cmH20] 5 cmH20 Plateau Pressure:  [21 cmH20-24 cmH20] 21 cmH20   Intake/Output Summary (Last 24 hours) at 12/01/2023 0745 Last data filed at 12/01/2023 0700 Gross per 24 hour  Intake 4248.58 ml  Output 2250 ml  Net 1998.58 ml   Filed Weights   11/27/23 0437 11/29/23 0500 12/01/23 0330  Weight: 84.4 kg 85.3 kg 88.2 kg    Examination: General: Crtitically ill-appearing middle-aged female, orally intubated HEENT: [place]/AT, eyes anicteric.  ETT in place Neuro: Sedated, not following commands.  Eyes are closed.  Pupils 3 mm bilateral reactive to light Chest: Reduced air entry at the bases bilaterally, no wheezes or rhonchi Heart: Regular rate and rhythm, no murmurs or gallops Abdomen: Soft, nondistended, bowel sounds present  Labs and images reviewed  Patient Lines/Drains/Airways Status     Active Line/Drains/Airways     Name Placement date Placement time Site Days   Arterial Line 11/30/23 Left Radial 11/30/23  0835  Radial  1   Peripheral IV 11/21/23 22 G 1.75 Anterior;Right Forearm 11/21/23  0422  Forearm  10   CVC Double Lumen 11/30/23 Left Internal jugular  16 cm 11/30/23  1037  -- 1   NG/OG Vented/Dual Lumen Oral 70 cm 11/30/23  1600  Oral  1   Urethral Catheter Dr. Adine PARAS. [place] Coude 24 Fr. 11/30/23  1416  Coude  1   Airway 7 mm 11/30/23  0833  -- 1   Wound 10/22/23 1500 Surgical Open Surgical Incision Other (Comment) Left;Proximal 10/22/23  1500   Other (Comment)  40   Wound 10/29/23 1410 Other (Comment) Thigh Distal;Left;Posterior 10/29/23  1410  Thigh  33        Resolved problem list   Assessment and Plan  Acute hypoxic respiratory failure due to recurrent acute pulmonary edema  Acute on chronic combined systolic and diastolic CHF exacerbation Moderate-severe aortic stenosis, mild-moderate MR Multivessel coronary artery disease Patient became hypoxic yesterday, required BiPAP, she came intubated from the OR Continue lung protective ventilation VAP prevention bundle in place PAD protocol with low-dose propofol  Continue aggressive diuresis, will try to extubate her later today after good diuresis CVP is elevated to 17 Advanced heart failure team is following She is not a candidate for TAVR GDMT as tolerated Continue aspirin  and statin Patient will need CABG and SAVR evaluation once clinically stable  Hypothyroidism Continue levothyroxine   DM-2 (HbA1c 5.5%) Patient blood sugars are better controlled today Continue sliding scale and Lantus , closely monitor fingerstick with goal 140-180  AKI on CKD-3a due to cardiorenal syndrome Gross hematuria Serum creatinine started trending down after she went to the OR and had cystoscopy and evacuation of large blood clot from bladder Nephrology is following, holding on CRRT She is on continuous bladder irrigation Urology is following Continue diuresis Monitor intake and output Avoid nephrotoxic agent  Acute urinary tract infection Patient complaining of dysuria few days ago Her UA showed many bacteria's.  Unfortunately urine culture was not sent Continue ceftriaxone  to complete 5 days therapy  Anemia of chronic disease Monitor H&H and transfuse if less than 7 Patient received 1 unit PRBC yesterday, now hemoglobin is between 8-9  Best Practice (right click and Reselect all SmartList Selections daily)   Diet/type: NPO DVT prophylaxis SCD Pressure ulcer(s): Please see  nursing notes GI prophylaxis: H2B Lines: N/A Foley:  Yes, and it is still needed Code Status:  full code Last date of multidisciplinary goals of care discussion [8/1: Patient and her mother both were updated at bedside, decision was to continue full scope of care]  Labs   CBC: Recent Labs  Lab 11/28/23 0545 11/28/23 1501 11/29/23 0230 11/29/23 1056 11/30/23 0739 11/30/23 1104 11/30/23 1348 11/30/23 1607 11/30/23 1710 12/01/23 0408 12/01/23 0410  WBC 18.2* 29.2* 21.0*  --  16.4*  --   --   --   --   --  13.9*  NEUTROABS  --  23.7* 14.4*  --   --   --   --   --   --   --   --   HGB 7.2* 7.9* 7.5*   < > 7.9*   < > 8.8* 9.5* 9.5* 8.8* 8.7*  HCT 22.5* 25.1* 23.8*   < > 25.8*   < > 26.0* 28.0* 28.0* 26.0* 28.2*  MCV 90.7 90.6 92.6  --  95.6  --   --   --   --   --  96.9  PLT 243 361 271  --  334  --   --   --   --   --  248   < > = values in this interval not displayed.  Basic Metabolic Panel: Recent Labs  Lab 11/24/23 0841 11/25/23 0209 11/26/23 0256 11/27/23 0235 11/28/23 0545 11/29/23 0230 11/29/23 1056 11/30/23 0739 11/30/23 1104 11/30/23 1348 11/30/23 1607 11/30/23 1710 12/01/23 0408 12/01/23 0410  NA 132* 134* 135 134* 136 136   < > 137   < > 137 139 139 138 138  K 3.8 4.0 3.5 3.6 3.5 4.6   < > 4.7   < > 4.4 3.9 3.9 4.1 4.1  CL 96* 97* 96* 96* 96* 97*  --  96*  --   --   --   --   --  101  CO2 22 25 25 26 26 24   --  21*  --   --   --   --   --  22  GLUCOSE 201* 154* 148* 166* 153* 211*  --  198*  --   --   --   --   --  179*  BUN 79* 91* 93* 93* 92* 103*  --  120*  --   --   --   --   --  111*  CREATININE 2.75* 2.31* 2.10* 2.06* 1.99* 2.43*  --  2.50*  --   --   --   --   --  2.12*  CALCIUM  8.8* 8.7* 8.8* 9.0 9.2 9.5  --  9.6  --   --   --   --   --  8.7*  MG 1.9 1.6* 2.5* 2.0  --  2.0  --   --   --   --   --   --   --   --   PHOS 4.2 3.4 3.1 2.6  --   --   --   --   --   --   --   --   --   --    < > = values in this interval not displayed.    GFR: Estimated Creatinine Clearance: 31.9 mL/min (A) (by C-G formula based on SCr of 2.12 mg/dL (H)). Recent Labs  Lab 11/28/23 0545 11/28/23 1501 11/29/23 0230 11/30/23 0739 12/01/23 0410  PROCALCITON  --   --  0.88  --   --   WBC 18.2* 29.2* 21.0* 16.4* 13.9*  LATICACIDVEN 0.9  --   --   --   --     Liver Function Tests: Recent Labs  Lab 11/24/23 0841 11/25/23 0209 11/26/23 0256 11/27/23 0235 11/30/23 0739  AST  --   --   --   --  25  ALT  --   --   --   --  19  ALKPHOS  --   --   --   --  80  BILITOT  --   --   --   --  1.2  PROT  --   --   --   --  7.8  ALBUMIN  2.6* 2.6* 2.7* 2.8* 3.2*   No results for input(s): LIPASE, AMYLASE in the last 168 hours. No results for input(s): AMMONIA in the last 168 hours.  ABG    Component Value Date/Time   PHART 7.379 12/01/2023 0408   PCO2ART 41.8 12/01/2023 0408   PO2ART 119 (H) 12/01/2023 0408   HCO3 24.7 12/01/2023 0408   TCO2 26 12/01/2023 0408   ACIDBASEDEF 1.0 12/01/2023 0408   O2SAT 99 12/01/2023 0408     Coagulation Profile: No results for input(s): INR, PROTIME in the last 168 hours.  Cardiac Enzymes: No results for input(s): CKTOTAL,  CKMB, CKMBINDEX, TROPONINI in the last 168 hours.  HbA1C: Hgb A1c MFr Bld  Date/Time Value Ref Range Status  10/28/2023 07:40 PM 5.5 4.8 - 5.6 % Final    Comment:    (NOTE) Diagnosis of Diabetes The following HbA1c ranges recommended by the American Diabetes Association (ADA) may be used as an aid in the diagnosis of diabetes mellitus.  Hemoglobin             Suggested A1C NGSP%              Diagnosis  <5.7                   Non Diabetic  5.7-6.4                Pre-Diabetic  >6.4                   Diabetic  <7.0                   Glycemic control for                       adults with diabetes.    08/02/2022 08:48 PM 6.4 (H) 4.8 - 5.6 % Final    Comment:    (NOTE)         Prediabetes: 5.7 - 6.4         Diabetes: >6.4         Glycemic  control for adults with diabetes: <7.0     CBG: Recent Labs  Lab 11/30/23 1528 11/30/23 1610 11/30/23 2014 11/30/23 2332 12/01/23 0406  GLUCAP 169* 144* 127* 164* 172*    The patient is critically ill due to acute respiratory failure due to recurrent pulmonary edema/acute on chronic HFpEF.  Critical care was necessary to treat or prevent imminent or life-threatening deterioration.  Critical care was time spent personally by me on the following activities: development of treatment plan with patient and/or surrogate as well as nursing, discussions with consultants, evaluation of patient's response to treatment, examination of patient, obtaining history from patient or surrogate, ordering and performing treatments and interventions, ordering and review of laboratory studies, ordering and review of radiographic studies, pulse oximetry, re-evaluation of patient's condition and participation in multidisciplinary rounds.   During this encounter critical care time was devoted to patient care services described in this note for 38 minutes.     Valinda Novas, MD [facility] See Amion for pager If no response to pager, please call [phone] until 7pm After 7pm, Please call E-link [phone]

## 2023-12-01 NOTE — Progress Notes (Signed)
 Urology Inpatient Progress Note  Subjective: Urine remains clear on slow CBI.  No clots in tubing. Patient remains intubated. On Rocephin . H&H stable.  Renal function improving.  Anti-infectives: Anti-infectives (From admission, onward)    Start     Dose/Rate Route Frequency Ordered Stop   11/29/23 0900  cefTRIAXone  (ROCEPHIN ) 2 g in sodium chloride  0.9 % 100 mL IVPB        2 g 200 mL/hr over 30 Minutes Intravenous Every 24 hours 11/29/23 0812     11/28/23 0800  cefTRIAXone  (ROCEPHIN ) 1 g in sodium chloride  0.9 % 100 mL IVPB  Status:  Discontinued        1 g 200 mL/hr over 30 Minutes Intravenous Every 24 hours 11/28/23 0735 11/28/23 2135   11/27/23 1615  cephALEXin  (KEFLEX ) capsule 500 mg  Status:  Discontinued        500 mg Oral Every 12 hours 11/27/23 1520 11/28/23 0735   11/19/23 1700  piperacillin -tazobactam (ZOSYN ) IVPB 2.25 g        2.25 g 100 mL/hr over 30 Minutes Intravenous Every 8 hours 11/19/23 0944 11/21/23 2024   11/18/23 1000  linezolid  (ZYVOX ) tablet 600 mg        600 mg Oral Every 12 hours 11/18/23 0008 11/21/23 2243   11/18/23 0800  piperacillin -tazobactam (ZOSYN ) IVPB 3.375 g  Status:  Discontinued        3.375 g 100 mL/hr over 30 Minutes Intravenous Every 6 hours 11/18/23 0721 11/19/23 0944   11/16/23 1000  linezolid  (ZYVOX ) tablet 600 mg  Status:  Discontinued        600 mg Per Tube Every 12 hours 11/16/23 0842 11/18/23 0003   11/15/23 1630  linezolid  (ZYVOX ) IVPB 600 mg  Status:  Discontinued        600 mg 300 mL/hr over 60 Minutes Intravenous Every 12 hours 11/15/23 1534 11/16/23 0842   11/15/23 1600  piperacillin -tazobactam (ZOSYN ) IVPB 3.375 g  Status:  Discontinued        3.375 g 12.5 mL/hr over 240 Minutes Intravenous Every 8 hours 11/15/23 1445 11/18/23 0721   11/13/23 1400  DAPTOmycin  (CUBICIN ) IVPB 700 mg/100mL premix  Status:  Discontinued        700 mg 200 mL/hr over 30 Minutes Intravenous Daily 11/12/23 1833 11/15/23 1533   11/12/23 2200   Ampicillin -Sulbactam (UNASYN ) 3 g in sodium chloride  0.9 % 100 mL IVPB  Status:  Discontinued        3 g 200 mL/hr over 30 Minutes Intravenous Every 6 hours 11/12/23 1833 11/15/23 1445       Current Facility-Administered Medications  Medication Dose Route Frequency Provider Last Rate Last Admin   0.9 %  sodium chloride  infusion (Manually program via Guardrails IV Fluids)   Intravenous Once Rashid, Farhan, MD       acetaminophen  (TYLENOL ) tablet 650 mg  650 mg Oral Q6H PRN Patel, Pranav M, MD   650 mg at 11/26/23 8076   aspirin  chewable tablet 81 mg  81 mg Per Tube Daily Chand, Sudham, MD   81 mg at 12/01/23 0901   atorvastatin  (LIPITOR) tablet 40 mg  40 mg Oral Daily Chand, Sudham, MD   40 mg at 11/29/23 9053   bethanechol  (URECHOLINE ) tablet 10 mg  10 mg Oral TID Simpson, Paula B, NP   10 mg at 11/30/23 2120   cefTRIAXone  (ROCEPHIN ) 2 g in sodium chloride  0.9 % 100 mL IVPB  2 g Intravenous Q24H Harold Scholz, MD   Stopped at  12/01/23 0934   Chlorhexidine  Gluconate Cloth 2 % PADS 6 each  6 each Topical Daily Howerter, Justin B, DO   6 each at 12/01/23 1027   feeding supplement (ENSURE PLUS HIGH PROTEIN) liquid 237 mL  237 mL Oral Q1400 Arrien, Mauricio Daniel, MD   237 mL at 11/29/23 1403   ferrous sulfate  tablet 325 mg  325 mg Oral Q breakfast Albustami, Omar M, MD   325 mg at 11/29/23 9355   furosemide  (LASIX ) 120 mg in dextrose  5 % 50 mL IVPB  120 mg Intravenous BID McLean, Dalton S, MD   Stopped at 12/01/23 9097   gabapentin  (NEURONTIN ) capsule 300 mg  300 mg Oral QHS Harold Scholz, MD   300 mg at 11/30/23 2116   Gerhardt's butt cream   Topical PRN Chand, Sudham, MD       hydrocortisone  cream 1 %   Topical TID Rashid, Farhan, MD   1 Application at 12/01/23 1027   insulin  aspart (novoLOG ) injection 0-15 Units  0-15 Units Subcutaneous Q4H Chand, Sudham, MD   3 Units at 12/01/23 1213   insulin  glargine-yfgn (SEMGLEE ) injection 6 Units  6 Units Subcutaneous BID Harold Scholz, MD   6 Units at  12/01/23 1000   ipratropium-albuterol  (DUONEB) 0.5-2.5 (3) MG/3ML nebulizer solution 3 mL  3 mL Nebulization Q6H PRN Antonetta Moccasin B, NP   3 mL at 11/28/23 2252   levothyroxine  (SYNTHROID ) tablet 150 mcg  150 mcg Oral Q0600 Harold Scholz, MD   150 mcg at 12/01/23 0658   lidocaine  (LMX) 4 % cream   Topical BID PRN Harold Scholz, MD   1 Application at 11/20/23 1745   midodrine  (PROAMATINE ) tablet 5 mg  5 mg Oral Q8H McLean, Dalton S, MD   5 mg at 12/01/23 9341   multivitamin with minerals tablet 1 tablet  1 tablet Oral Daily Arrien, Elidia Sieving, MD   1 tablet at 11/29/23 9052   nutrition supplement (JUVEN) (JUVEN) powder packet 1 packet  1 packet Oral BID AC & HS Arrien, Elidia Sieving, MD   1 packet at 11/30/23 2120   Oral care mouth rinse  15 mL Mouth Rinse PRN Claudene Sieving BROCKS, MD       Oral care mouth rinse  15 mL Mouth Rinse PRN Arrien, Elidia Sieving, MD       Oral care mouth rinse  15 mL Mouth Rinse Q2H Chand, Sudham, MD   15 mL at 12/01/23 1216   Oral care mouth rinse  15 mL Mouth Rinse PRN Chand, Sudham, MD       oxyCODONE  (Oxy IR/ROXICODONE ) immediate release tablet 2.5 mg  2.5 mg Oral Q6H PRN Harold Scholz, MD   2.5 mg at 11/20/23 2219   pantoprazole  (PROTONIX ) EC tablet 40 mg  40 mg Oral Daily Chand, Sudham, MD   40 mg at 11/29/23 0947   polyethylene glycol (MIRALAX  / GLYCOLAX ) packet 17 g  17 g Oral Daily PRN Chand, Sudham, MD       prochlorperazine  (COMPAZINE ) injection 10 mg  10 mg Intravenous Q6H PRN Patel, Pranav M, MD   10 mg at 11/17/23 2338   propofol  (DIPRIVAN ) 1000 MG/100ML infusion  0-80 mcg/kg/min Intravenous Titrated Harold Scholz, MD 15.35 mL/hr at 12/01/23 1120 30 mcg/kg/min at 12/01/23 1120   senna-docusate (Senokot-S) tablet 1 tablet  1 tablet Oral QHS PRN Harold Scholz, MD       sodium chloride  flush (NS) 0.9 % injection 10-40 mL  10-40 mL Intracatheter Q12H Howerter, Eva  B, DO   10 mL at 12/01/23 1027   sodium chloride  flush (NS) 0.9 % injection 10-40 mL  10-40  mL Intracatheter PRN Howerter, Justin B, DO       sodium chloride  irrigation 0.9 % 3,000 mL  3,000 mL Irrigation Continuous Rashid, Farhan, MD   3,000 mL at 11/28/23 1610     Objective: Vital signs in last 24 hours: Temp:  [96.2 F (35.7 C)-97.9 F (36.6 C)] 97.9 F (36.6 C) (08/03 0817) Pulse Rate:  [64-102] 101 (08/03 1118) Resp:  [16-39] 29 (08/03 1118) BP: (119)/(68) 119/68 (08/02 1445) SpO2:  [88 %-100 %] 91 % (08/03 1118) Arterial Line BP: (64-145)/(45-97) 122/97 (08/03 1118) FiO2 (%):  [50 %] 50 % (08/03 0400) Weight:  [88.2 kg] 88.2 kg (08/03 0330)  Intake/Output from previous day: 08/02 0701 - 08/03 0700 In: 4248.6 [I.V.:1836.5; Blood:630; NG/GT:120; IV Piggyback:162.1] Out: 2250 [Urine:2050; Emesis/NG output:200] Intake/Output this shift: Total I/O In: 373.7 [I.V.:40.6; IV Piggyback:333.1] Out: 550 [Urine:550]  GENERAL APPEARANCE:  Intubated GU:  foley draining clear urine with slow CBI  Lab Results:  Recent Labs    11/30/23 0739 11/30/23 1104 12/01/23 0408 12/01/23 0410  WBC 16.4*  --   --  13.9*  HGB 7.9*   < > 8.8* 8.7*  HCT 25.8*   < > 26.0* 28.2*  PLT 334  --   --  248   < > = values in this interval not displayed.   BMET Recent Labs    11/30/23 0739 11/30/23 1104 12/01/23 0408 12/01/23 0410  NA 137   < > 138 138  K 4.7   < > 4.1 4.1  CL 96*  --   --  101  CO2 21*  --   --  22  GLUCOSE 198*  --   --  179*  BUN 120*  --   --  111*  CREATININE 2.50*  --   --  2.12*  CALCIUM  9.6  --   --  8.7*   < > = values in this interval not displayed.   PT/INR No results for input(s): LABPROT, INR in the last 72 hours. ABG Recent Labs    11/30/23 1710 12/01/23 0408  PHART 7.323* 7.379  HCO3 25.6 24.7    Studies/Results: DG Abd 1 View Result Date: 11/30/2023 CLINICAL DATA:  Nasogastric tube placement. EXAM: ABDOMEN - 1 VIEW COMPARISON:  11/19/2023 FINDINGS: Nasogastric tube is present with tip and side-port over the stomach in the left mid  to upper abdomen. Bowel gas pattern is nonobstructive. There is a paucity bowel gas visualized. No free peritoneal air. Remainder of the exam is unchanged. IMPRESSION: 1. Nonobstructive bowel gas pattern. 2. Nasogastric tube with tip and side-port over the stomach in the left mid to upper abdomen. Electronically Signed   By: Toribio Agreste M.D.   On: 11/30/2023 17:30   DG CHEST PORT 1 VIEW Result Date: 11/30/2023 CLINICAL DATA:  Central line placement EXAM: PORTABLE CHEST 1 VIEW COMPARISON:  11/28/2023 FINDINGS: Single frontal view of the chest demonstrates endotracheal tube overlying tracheal air column, tip approximately 3 cm above carina. Enteric catheter passes below diaphragm, tip excluded by collimation. Left internal jugular catheter tip overlies the SVC. Cardiac silhouette is stable. Widespread interstitial and ground-glass opacities are again seen throughout the lungs. Trace bilateral effusions again noted. No pneumothorax. IMPRESSION: 1. Support devices as above. 2. Multifocal bilateral airspace disease and trace bilateral effusions, without significant change. Electronically Signed   By: Ozell Daring  M.D.   On: 11/30/2023 16:26   DG C-Arm 1-60 Min Result Date: 11/30/2023 CLINICAL DATA:  Cystoscopy EXAM: DG C-ARM 1-60 MIN COMPARISON:  None Available. FINDINGS: A single fluoroscopic image was obtained during the performance of the procedure and is provided for interpretation only. Image is obtained over the lower pelvis, with no gross radiographic abnormality identified. Please refer to the operative report. Fluoroscopy time: 23.6 seconds, 6.8 mGy IMPRESSION: 1. Intraoperative evaluation as above. Please refer to the operative report. Electronically Signed   By: Ozell Daring M.D.   On: 11/30/2023 15:17   DG Pelvis Portable Result Date: 11/30/2023 CLINICAL DATA:  [id number] Surgery, elective [id number]. EXAM: PORTABLE PELVIS 1-2 VIEWS COMPARISON:  Radiograph from earlier the same day. FINDINGS: Interval  disappearance of previously seen semi circular thin metallic foreign body. No other radiopaque foreign body seen. IMPRESSION: *Interval disappearance of previously seen semi circular thin metallic foreign body. No other radiopaque foreign body. Critical Value/emergent results were called by telephone at the time of interpretation on 11/30/2023 at 2:28 pm to provider ADINE MANLY , who verbally acknowledged these results. Electronically Signed   By: Ree Molt M.D.   On: 11/30/2023 14:29   DG Pelvis 1-2 Views Result Date: 11/30/2023 CLINICAL DATA:  [id number] Surgery, elective [id number]. EXAM: PELVIS - 1-2 VIEW COMPARISON:  None Available. FINDINGS: The provided image demonstrates An approximately 5-6 mm long half circular, thin metallic foreign body overlying the right side of the pubis near the pubic symphysis. No other radiopaque foreign bodies. Vascular stent noted overlying the left superior thigh region. IMPRESSION: *There is a single metallic foreign body overlying the right side of the pubis. Critical Value/emergent results were called by telephone at the time of interpretation on 11/30/2023 at 1:47 pm to provider ADINE MANLY , who verbally acknowledged these results. Electronically Signed   By: Ree Molt M.D.   On: 11/30/2023 13:48     Assessment & Plan:  Gross hematuria with clot retention: - POD #1 s/p cysto, extensive clot evacuation - urine clear with slow CBI - wean CBI to off - continue foley for 7-10 days - etiology for gross hematuria, clots unclear ? UTI  UTI: - U/A from 7/29 suspicious for UTI; no cx obtained. - on Rocephin    ADINE MANLY, MD 12/01/2023

## 2023-12-01 NOTE — Progress Notes (Signed)
 Patient ID: Orvetta Danielski, female   DOB: 03-29-1966, 58 y.o.   MRN: [id number]     Advanced Heart Failure Rounding Note  Cardiologist: Vishnu SHAUNNA Maywood, MD  [facility] Consulting MD: Dr. Cherrie Chief Complaint: Acute on chronic HFpEF / RV Failure Patient Profile   58 y.o. female with history of PAD with prior SFA stents and bilateral TMA, severe 3v CAD, MR/AS, DM II, anemia, HTN, HLD, hypothyroidism.  Admitted in 6/25 with MRSA bactermia and necrotizing fasciitis s/p left BKA. Transferred back to [facility] from rehab d/t acute on chronic CHF with concern for low-output and acute respiratory failure.  Subjective:    7/18: aborted cath for acute respiratory distress; intubated; DBA started 7/19: started on CRRT 7/20: extubated 7/21: hgb 6.5 s/p 1u RBCs 7/23: CRRT stopped 8/1: RHC (see below).  8/2: To OR for cystoscopy and declotting of bladder.  Bladder filled with clot, no obvious bladder lesion.   BUN creatinine now trending down, 111/2.12.  Good UOP since bladder declotted but still I/Os+ with OR yesterday and weight up.  CVP 15 today on my read. SBP 90s-100s, off pressors and on midodrine  5 tid.   She is intubated post-OR on 8/2. CXR yesterday with bilateral airspace disease, suspect edema.   Hgb 8.7 today, WBCs 13.9. She remains on ceftriaxone  for UTI.   8/1 RHC Procedural Findings: Hemodynamics (mmHg) RA mean 8 RV 58/15 PA 60/25, mean 40 PCWP mean 29 with prominent v waves to 43 Oxygen  saturations: PA 53% AO 100% Cardiac Output (Fick) 5.28  Cardiac Index (Fick) 2.77 PVR 2.1 WU   Objective:    Weight Range: 88.2 kg Body mass index is 33.38 kg/m.   Vital Signs:   Temp:  [96.2 F (35.7 C)-97.9 F (36.6 C)] 97.9 F (36.6 C) (08/03 0817) Pulse Rate:  [64-84] 78 (08/03 0700) Resp:  [16-33] 23 (08/03 0700) BP: (119)/(68) 119/68 (08/02 1445) SpO2:  [97 %-100 %] 98 % (08/03 0700) Arterial Line BP: (64-136)/(45-82) 97/78 (08/03 0700) FiO2 (%):  [50 %] 50 % (08/03  0400) Weight:  [88.2 kg] 88.2 kg (08/03 0330) Last BM Date : 11/28/23  Weight change: Filed Weights   11/27/23 0437 11/29/23 0500 12/01/23 0330  Weight: 84.4 kg 85.3 kg 88.2 kg   Intake/Output:  Intake/Output Summary (Last 24 hours) at 12/01/2023 0833 Last data filed at 12/01/2023 0700 Gross per 24 hour  Intake 4248.58 ml  Output 2250 ml  Net 1998.58 ml    Physical Exam   General: Intubated/sedated.  Neck: JVP 14 cm, no thyromegaly or thyroid nodule.  Lungs: Clear to auscultation bilaterally with normal respiratory effort. CV: Nondisplaced PMI.  Heart regular S1/S2, no S3/S4, 2/6 SEM RUSB.  1+ right ankle edema.   Abdomen: Soft, nontender, no hepatosplenomegaly, no distention.  Skin: Intact without lesions or rashes.  Neurologic: Sedated on vent.  Extremities: No clubbing or cyanosis. Right TMA, left BKA.  HEENT: Normal.   Telemetry   NSR 90s with PVCs (personally reviewed)  Labs    CBC Recent Labs    11/28/23 1501 11/29/23 0230 11/29/23 1056 11/30/23 0739 11/30/23 1104 12/01/23 0408 12/01/23 0410  WBC 29.2* 21.0*  --  16.4*  --   --  13.9*  NEUTROABS 23.7* 14.4*  --   --   --   --   --   HGB 7.9* 7.5*   < > 7.9*   < > 8.8* 8.7*  HCT 25.1* 23.8*   < > 25.8*   < > 26.0* 28.2*  MCV 90.6 92.6  --  95.6  --   --  96.9  PLT 361 271  --  334  --   --  248   < > = values in this interval not displayed.   Basic Metabolic Panel Recent Labs    91/98/74 0230 11/29/23 1056 11/30/23 0739 11/30/23 1104 12/01/23 0408 12/01/23 0410  NA 136   < > 137   < > 138 138  K 4.6   < > 4.7   < > 4.1 4.1  CL 97*  --  96*  --   --  101  CO2 24  --  21*  --   --  22  GLUCOSE 211*  --  198*  --   --  179*  BUN 103*  --  120*  --   --  111*  CREATININE 2.43*  --  2.50*  --   --  2.12*  CALCIUM  9.5  --  9.6  --   --  8.7*  MG 2.0  --   --   --   --   --    < > = values in this interval not displayed.   Liver Function Tests Recent Labs    11/30/23 0739  AST 25  ALT 19   ALKPHOS 80  BILITOT 1.2  PROT 7.8  ALBUMIN  3.2*    BNP (last 3 results) Recent Labs    11/07/23 1457 11/13/23 0521 11/29/23 0230  BNP 336.4* 1,626.5* 2,406.9*   Medications:    Scheduled Medications:  sodium chloride    Intravenous Once   aspirin   81 mg Per Tube Daily   atorvastatin   40 mg Oral Daily   bethanechol   10 mg Oral TID   Chlorhexidine  Gluconate Cloth  6 each Topical Daily   feeding supplement  237 mL Oral Q1400   ferrous sulfate   325 mg Oral Q breakfast   gabapentin   300 mg Oral QHS   hydrocortisone  cream   Topical TID   insulin  aspart  0-15 Units Subcutaneous Q4H   insulin  glargine-yfgn  6 Units Subcutaneous BID   levothyroxine   150 mcg Oral Q0600   metolazone   5 mg Oral Once   midodrine   5 mg Oral Q8H   multivitamin with minerals  1 tablet Oral Daily   nutrition supplement (JUVEN)  1 packet Oral BID AC & HS   mouth rinse  15 mL Mouth Rinse Q2H   pantoprazole   40 mg Oral Daily   sodium chloride  flush  10-40 mL Intracatheter Q12H   Infusions:  cefTRIAXone  (ROCEPHIN )  IV 0 g (11/29/23 1014)   furosemide  120 mg (12/01/23 0758)   potassium chloride      propofol  (DIPRIVAN ) infusion 30 mcg/kg/min (12/01/23 0734)   sodium chloride  irrigation      PRN Medications: acetaminophen , Gerhardt's butt cream, ipratropium-albuterol , lidocaine , mouth rinse, mouth rinse, mouth rinse, oxyCODONE , polyethylene glycol, prochlorperazine , senna-docusate, sodium chloride  flush  Assessment/Plan  Acute on chronic HFpEF>Mixed Shock; Cardiogenic/Septic - TEE 6/25: EF 60-65%, RV okay, mild to mod MR, moderate-severe AS with mG 27 mmHg and AVA 1.07 cm2 - TTE 11/15/23 LVEF 25% - Taken for [facility] 7/17, however developed flash pulmonary edema. Unable to lie flat for procedure.  - Repeat echo with EF 25-30%, G2DD, mild to mod MR, mod-severe AS  - GDMT limited by renal function and hypotension (SBP 80s-90s generally). On midodrine  5 mg tid with stable BP this morning.  - RHC on 8/1 showed  severely elevated PCWP, mildly elevated  RAP, preserved CO.  - Bladder outlet obstruction by clot, AKI, went to OR on 8/2 for declotting.  Good UOP post-declotting of bladder.  - BUN/creatinine elevated but trending down, 111/2.12 today.   - CVP 15, will give Lasix  120 mg IV bid with dose of metolazone  5 mg x 1 this morning, gentle K repletion and BMET in pm.  - MAP stable off pressors, she is on midodrine  5 tid.  Will follow co-ox.   2. Acute respiratory failure with hypoxia - CXR 7/20 with b/l diffuse airspace opacities, likely pulmonary edema, though given fevers also concern for PNA - CXR 7/31 consistent with pulmonary edema.  - Intubated post-OR on 8/2.  CXR with pulmonary edema.  - Diuresis as above, CCM working towards extubation today.     3. CAD - NSTEMI in 4/24. Multivessel CAD. CABG had been recommended but managed medically due to other active medical issues - Further ischemic evaluation pending renal function => no cath with AKI.  - continue aspirin  + statin   4. Valvular heart disease - Mild to moderate MR and moderate to severe AS on echo 6/25 - Repeat echo 7/25 with extremely poor windows, based on exam expect AS moderate-severe - Echo 11/20/23 with Vmax 3.21, DVT 0.26, mod to severe AS, mG 22 - She is not a TAVR or SAVR candidate currently so even if she has severe AS it would not change current management   5. MRSA bacteremia - Necrotizing fasciitis - S/p recent left BKA 6/25; S/p TMA in 4/24  6. UTI - WBCs 21 => 13.9, on ceftriaxone  for UTI. Afebrile.   7. PAD - hx SFA stents - On aspirin  + statin. Had been on plavix  but stopped d/t risk of bleeding.   8. Anemia - s/p 1 u RBC 7/14 - Hx GI bleed from AVMs.  - fecal occult negative - Hematuria resolving with clearing urine.   - Hgb 8.7 today, transfused yesterday in OR.    9. AKI on CKD IIIa - In setting of volume overload/low-output.  - Started on CVVH 7/19, stopped 7/22.  - Some renal recovery and  creatinine had been trending down, but BUN/creatinine trended back up in setting bladder outlet obstruction from extensive clots in bladder.  Now s/p cystoscopy and clot removal, good UOP and BUN/creatinine high but trending down (111/2.1 today).  - Attempting diuresis as above with pulmonary edema and high CVP.   10. Hematuria - Has UTI by UA   - Ceftriaxone  as above.  - Developed hematuria => attempted CBI but foley stopped draining and unable to correct at bedside.  To OR 8/2 for cystoscopy, found extensive clot filling bladder and evacuated, no obvious bladder lesion.  Now draining clear urine.   CRITICAL CARE Performed by: Ezra Shuck  Total critical care time: 40 minutes  Critical care time was exclusive of separately billable procedures and treating other patients.  Critical care was necessary to treat or prevent imminent or life-threatening deterioration.  Critical care was time spent personally by me on the following activities: development of treatment plan with patient and/or surrogate as well as nursing, discussions with consultants, evaluation of patient's response to treatment, examination of patient, obtaining history from patient or surrogate, ordering and performing treatments and interventions, ordering and review of laboratory studies, ordering and review of radiographic studies, pulse oximetry and re-evaluation of patient's condition.   Ezra Shuck 12/01/2023 8:33 AM

## 2023-12-02 ENCOUNTER — Encounter (HOSPITAL_COMMUNITY): Payer: Self-pay | Admitting: Urology

## 2023-12-02 DIAGNOSIS — D62 Acute posthemorrhagic anemia: Secondary | ICD-10-CM

## 2023-12-02 LAB — COOXEMETRY PANEL
Carboxyhemoglobin: 2.4 % — ABNORMAL HIGH (ref 0.5–1.5)
Methemoglobin: <0.7 % (ref 0.0–1.5)
O2 Saturation: 84 %
Total hemoglobin: 8.8 g/dL — ABNORMAL LOW (ref 12.0–16.0)

## 2023-12-02 LAB — CBC
HCT: 28.6 % — ABNORMAL LOW (ref 36.0–46.0)
Hemoglobin: 8.7 g/dL — ABNORMAL LOW (ref 12.0–15.0)
MCH: 30.1 pg (ref 26.0–34.0)
MCHC: 30.4 g/dL (ref 30.0–36.0)
MCV: 99 fL (ref 80.0–100.0)
Platelets: 233 K/uL (ref 150–400)
RBC: 2.89 MIL/uL — ABNORMAL LOW (ref 3.87–5.11)
RDW: 23.2 % — ABNORMAL HIGH (ref 11.5–15.5)
WBC: 11.5 K/uL — ABNORMAL HIGH (ref 4.0–10.5)
nRBC: 0 % (ref 0.0–0.2)

## 2023-12-02 LAB — BASIC METABOLIC PANEL WITH GFR
Anion gap: 17 — ABNORMAL HIGH (ref 5–15)
BUN: 110 mg/dL — ABNORMAL HIGH (ref 6–20)
CO2: 26 mmol/L (ref 22–32)
Calcium: 8.8 mg/dL — ABNORMAL LOW (ref 8.9–10.3)
Chloride: 99 mmol/L (ref 98–111)
Creatinine, Ser: 1.97 mg/dL — ABNORMAL HIGH (ref 0.44–1.00)
GFR, Estimated: 29 mL/min — ABNORMAL LOW (ref >60)
Glucose, Bld: 122 mg/dL — ABNORMAL HIGH (ref 70–99)
Potassium: 3.2 mmol/L — ABNORMAL LOW (ref 3.5–5.1)
Sodium: 142 mmol/L (ref 135–145)

## 2023-12-02 LAB — MAGNESIUM: Magnesium: 2.3 mg/dL (ref 1.7–2.4)

## 2023-12-02 LAB — GLUCOSE, CAPILLARY
Glucose-Capillary: 112 mg/dL — ABNORMAL HIGH (ref 70–99)
Glucose-Capillary: 116 mg/dL — ABNORMAL HIGH (ref 70–99)
Glucose-Capillary: 126 mg/dL — ABNORMAL HIGH (ref 70–99)
Glucose-Capillary: 131 mg/dL — ABNORMAL HIGH (ref 70–99)
Glucose-Capillary: 128 mg/dL — ABNORMAL HIGH (ref 70–99)
Glucose-Capillary: 105 mg/dL — ABNORMAL HIGH (ref 70–99)

## 2023-12-02 LAB — POTASSIUM: Potassium: 3.4 mmol/L — ABNORMAL LOW (ref 3.5–5.1)

## 2023-12-02 LAB — PHOSPHORUS: Phosphorus: 5.7 mg/dL — ABNORMAL HIGH (ref 2.5–4.6)

## 2023-12-02 MED ORDER — POTASSIUM CHLORIDE 20 MEQ PO PACK
40.0000 meq | PACK | Freq: Once | ORAL | Status: AC
  Administered 2023-12-02: 40 meq via ORAL
  Filled 2023-12-02: qty 2

## 2023-12-02 MED ORDER — POTASSIUM CHLORIDE 10 MEQ/100ML IV SOLN: 10.0000 meq | INTRAVENOUS | Status: DC

## 2023-12-02 MED ORDER — POTASSIUM CHLORIDE 10 MEQ/100ML IV SOLN
10.0000 meq | INTRAVENOUS | Status: DC
  Administered 2023-12-02 (×2): 10 meq via INTRAVENOUS
  Filled 2023-12-02 (×2): qty 100

## 2023-12-02 MED ORDER — POTASSIUM CHLORIDE 10 MEQ/50ML IV SOLN
10.0000 meq | INTRAVENOUS | Status: AC
  Administered 2023-12-02 (×2): 10 meq via INTRAVENOUS
  Filled 2023-12-02 (×2): qty 50

## 2023-12-02 MED ORDER — ENSURE PLUS HIGH PROTEIN PO LIQD
237.0000 mL | Freq: Three times a day (TID) | ORAL | Status: DC
  Administered 2023-12-02 – 2023-12-05 (×8): 237 mL via ORAL

## 2023-12-02 NOTE — Progress Notes (Addendum)
 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 Failed RHC 7/17 due to orthopnea. Developed gross hematuria and foley cath bladder irrigation. Hospitalist called PCCM for worsening hypoxia (3 L [place] to 6 L [place]), MAP 58 mmHg, and worsening SOB.   Pertinent  Medical History  Ischemic cardiomyopathy (CAD, EF 25%, G2DD), moderate-severe aortic stenosis, mild-moderate MR, HTN, PAD, dyslipidemia, hypothyroidism, DM-2, CKD-3a, GERD, Fe def anemia, nec fasc s/p left BKA  Significant Hospital Events: Including procedures, antibiotic start and stop dates in addition to other pertinent events   7/16 admission (PNA and HF working dx) 7/17 ICU, intubated 7/18 started spiking fever with Tmax 101.5, tachycardic.  X-ray chest still showing bilateral infiltrate but FiO2 is coming down.  Lasix  infusion increased to 30 mg/h 7/19 started on CRRT, tolerating spontaneous breathing trial 7/20 extubated 7/22 CRRT holiday, Levophed /dobutamine  7/23 lasix  challenge, transfused 1u PRBC, remains low dose NE, DBA 2.5, CVP 7 7/24 laisx, off DBA 7/25: PCCM signed off 7/31: PCCM was called for eval. PCXR: worse pulm edema and bilateral pleural effusions. Negative 1.6 L fluid balance in the last 3 days 8/2 started with hematuria, Foley is leaking, urology unable to irrigate the bladder, went to the  OR, underwent cystoscopy which showed large clot in urinary bladder no lesion no bladder perf. Clot was evacuated, placed on CBI. Etiology of clot not clear. Urology hypothesizing UTI  8/3 remained on vent. CBI continued  8/4 passed SBT extubated to BIPAP. CVP ~ 10, tachycardic   Interim History / Subjective:  SBI parameters acceptable for extubation.  Objective    Blood pressure 104/60, pulse (!) 105, temperature 99.2 F (37.3 C), temperature source Oral, resp. rate (!) 37, height 5' 4 (1.626 m), weight 86.1 kg, SpO2 97%. CVP:  [8 mmHg-74 mmHg] 8 mmHg  Vent Mode: PRVC FiO2 (%):  [40 %] 40 % Set Rate:  [22 bmp] 22 bmp Vt Set:  [430 mL] 430 mL PEEP:  [5 cmH20] 5 cmH20 Pressure Support:  [10 cmH20] 10 cmH20 Plateau Pressure:  [2 [id number] cmH20] 19 cmH20   Intake/Output Summary (Last 24 hours) at 12/02/2023 0723 Last data filed at 12/02/2023 0700 Gross per 24 hour  Intake 964.61 ml  Output 2700 ml  Net -1735.39 ml   Filed Weights   11/29/23 0500 12/01/23 0330 12/02/23 0404  Weight: 85.3 kg 88.2 kg 86.1 kg    Examination: General Chronically ill-appearing 58 year old female patient currently intubated and on spontaneous breathing trial.  Mild accessory use but indicates she is not short of breath.  SBI less than 105 Neuro: She is awake, cooperative, no focal deficits appreciated Pulmonary diffuse rhonchi, no nasal flaring but does have some mild accessory use.  Currently on SBT with volumes in the mid 300-400 range respiratory rate in the 28-32 range Pcxr on 2nd diffuse bilateral airspace disease  Cardiac currently sinus rhythm/sinus tach heart sounds with systolic heart murmur consistent  with her known severe aortic stenosis Abdomen is soft and nontender Extremities are warm pulses are palpable, she has the left AKA, in the right prior transmetatarsal amputation GU clear yellow via Foley catheter Resolved problem list   Assessment and Plan  Acute hypoxic respiratory failure due  to recurrent acute pulmonary edema Returned from OR on 2nd intubated, 8/3 exam still c/w volume overload. Treatment focus on volume removal. SBT indices acceptable for extubation but WOB is boarder line. She indicates to me not short of breath and wants tube out Plan Extubate to BIPAP.  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  Need to be careful not to overdiuresis Cont tele  GDMT as tolerated Continue aspirin  and statin Try to avoid tachycardia Ensure adequate mean arterial pressure for coronary perfusion Patient will need CABG and SAVR evaluation once clinically stable   AKI on CKD-3a due to cardiorenal syndrome Gross hematuria felt due UTI Serum creatinine started trending down after she went to the OR and had cystoscopy and evacuation of large blood clot from bladder on 2nd and continues to improve Nephrology is following, holding on CRRT plan She was on continuous bladder irrigation, now off  Urology is following Continue diuresis Monitor intake and output Avoid nephrotoxic agent Continue ceftriaxone  to complete 5 days therapy (dc after 8/5 dose)    Fluid and electrolyte imbalance: hypokalemia  Plan Replaced and recheck as indicated  DM-2 (HbA1c 5.5%) Excellent glycemic control over last 24h Plan Cont mod ssi Semglee  6 units q 12 Goal 140-180   Hypothyroidism plan Continue levothyroxine   Anemia of chronic disease Patient received 1 unit PRBC  8/2 Stable  Plan Monitor H&H and transfuse if less than 7  Best Practice (right click and Reselect all SmartList Selections daily)   Diet/type: NPO DVT prophylaxis SCD Pressure ulcer(s): Please see nursing notes GI prophylaxis: H2B Lines: N/A Foley:  Yes, and it is still needed Code Status:  full code Last date of multidisciplinary goals of care discussion [8/1: Patient and her mother both were updated at bedside, decision was to continue full scope of care]  My cct 45 min

## 2023-12-02 NOTE — Progress Notes (Signed)
 2 Days Post-Op Subjective: Kayla Schmitt. First ti meetint pt. She was accompanied by a family member/caretaker, with whom I reviewed the plan. Urine clear on rounds and CBI off overnight  Objective: Vital signs in last 24 hours: Temp:  [97.3 F (36.3 C)-99.2 F (37.3 C)] 99.2 F (37.3 C) (08/04 0430) Pulse Rate:  [83-107] 105 (08/04 0700) Resp:  [17-50] 37 (08/04 0700) BP: (95-112)/(57-74) 95/57 (08/04 0759) SpO2:  [88 %-99 %] 97 % (08/04 0700) Arterial Line BP: (87-145)/(41-97) 107/51 (08/04 0700) FiO2 (%):  [40 %] 40 % (08/04 0816) Weight:  [86.1 kg] 86.1 kg (08/04 0404)  Assessment/Plan: 58 y.o. female with gross hematuria/clot obstruction. Complex cardiac hx  #gross hematuria- resolved  To the OR with Dr. Roseann on 11/30/2023 for cystoscopy with clot evacuation and fulguration.  Urine has cleared on CBI which was clamped overnight.  Remains clear on rounds this morning.  Third port plugged.  Foley catheter in place for 7-10 days. If she remains in hospital, please remove in the am.  I doubt she will discharge prior to her scheduled voiding trial, but if so please contact me.   Oupt follow up with Dr. Roseann  Urology will sign off at this time.  Please feel free to call with questions or concerns  Intake/Output from previous day: 08/03 0701 - 08/04 0700 In: 964.6 [I.V.:404.6; NG/GT:60; IV Piggyback:500] Out: 2700 [Urine:2700]  Intake/Output this shift: Total I/O In: 10 [I.V.:10] Out: -   Physical Exam:  General: Alert and oriented CV: No cyanosis Lungs: equal chest rise Gu: 3 way foley in place draining clear yellow urine. 3rd port plugged  Lab Results: Recent Labs    12/01/23 0408 12/01/23 0410 12/02/23 0353  HGB 8.8* 8.7* 8.7*  HCT 26.0* 28.2* 28.6*   BMET Recent Labs    12/01/23 0410 12/01/23 1500 12/02/23 0353  NA 138 140 142  K 4.1 4.0 3.2*  CL 101 102 99  CO2 22 23 26   GLUCOSE 179* 103* 122*  BUN 111* 111* 110*  CREATININE 2.12* 2.08*  1.97*  CALCIUM  8.7* 8.8* 8.8*  HGB 8.7*  --  8.7*  WBC 13.9*  --  11.5*     Studies/Results: DG Abd 1 View Result Date: 12/01/2023 CLINICAL DATA:  [id number] Encounter for orogastric (OG) tube placement [id number] EXAM: ABDOMEN - 1 VIEW COMPARISON:  Chest x-ray 11/30/2023 FINDINGS: Partially visualized central venous catheter and endotracheal tubes. Enteric tube courses below the hemidiaphragm with tip collimated off view and side port overlying the expected region of the gastric lumen. The bowel gas pattern is normal. No radio-opaque calculi or other significant radiographic abnormality are seen. Vascular calcifications. Partially visualized lungs demonstrate persistent interstitial/airspace opacities and likely bilateral trace pleural effusions. IMPRESSION: 1. Enteric tube in good position.  Could be retracted by 8 cm. 2. Other lines and tubes only partially visualized and collimated off view. 3. Partially visualized lungs demonstrate persistent interstitial/airspace opacities and likely bilateral trace pleural effusions. Electronically Signed   By: Morgane  Naveau M.D.   On: 12/01/2023 12:52   DG Abd 1 View Result Date: 11/30/2023 CLINICAL DATA:  Nasogastric tube placement. EXAM: ABDOMEN - 1 VIEW COMPARISON:  11/19/2023 FINDINGS: Nasogastric tube is present with tip and side-port over the stomach in the left mid to upper abdomen. Bowel gas pattern is nonobstructive. There is a paucity bowel gas visualized. No free peritoneal air. Remainder of the exam is unchanged. IMPRESSION: 1. Nonobstructive bowel gas pattern. 2. Nasogastric tube with tip and side-port over the  stomach in the left mid to upper abdomen. Electronically Signed   By: Toribio Agreste M.D.   On: 11/30/2023 17:30   DG CHEST PORT 1 VIEW Result Date: 11/30/2023 CLINICAL DATA:  Central line placement EXAM: PORTABLE CHEST 1 VIEW COMPARISON:  11/28/2023 FINDINGS: Single frontal view of the chest demonstrates endotracheal tube overlying tracheal air  column, tip approximately 3 cm above carina. Enteric catheter passes below diaphragm, tip excluded by collimation. Left internal jugular catheter tip overlies the SVC. Cardiac silhouette is stable. Widespread interstitial and ground-glass opacities are again seen throughout the lungs. Trace bilateral effusions again noted. No pneumothorax. IMPRESSION: 1. Support devices as above. 2. Multifocal bilateral airspace disease and trace bilateral effusions, without significant change. Electronically Signed   By: Ozell Daring M.D.   On: 11/30/2023 16:26   DG C-Arm 1-60 Min Result Date: 11/30/2023 CLINICAL DATA:  Cystoscopy EXAM: DG C-ARM 1-60 MIN COMPARISON:  None Available. FINDINGS: A single fluoroscopic image was obtained during the performance of the procedure and is provided for interpretation only. Image is obtained over the lower pelvis, with no gross radiographic abnormality identified. Please refer to the operative report. Fluoroscopy time: 23.6 seconds, 6.8 mGy IMPRESSION: 1. Intraoperative evaluation as above. Please refer to the operative report. Electronically Signed   By: Ozell Daring M.D.   On: 11/30/2023 15:17   DG Pelvis Portable Result Date: 11/30/2023 CLINICAL DATA:  [id number] Surgery, elective [id number]. EXAM: PORTABLE PELVIS 1-2 VIEWS COMPARISON:  Radiograph from earlier the same day. FINDINGS: Interval disappearance of previously seen semi circular thin metallic foreign body. No other radiopaque foreign body seen. IMPRESSION: *Interval disappearance of previously seen semi circular thin metallic foreign body. No other radiopaque foreign body. Critical Value/emergent results were called by telephone at the time of interpretation on 11/30/2023 at 2:28 pm to provider ADINE MANLY , who verbally acknowledged these results. Electronically Signed   By: Ree Molt M.D.   On: 11/30/2023 14:29   DG Pelvis 1-2 Views Result Date: 11/30/2023 CLINICAL DATA:  [id number] Surgery, elective [id number]. EXAM: PELVIS  - 1-2 VIEW COMPARISON:  None Available. FINDINGS: The provided image demonstrates An approximately 5-6 mm long half circular, thin metallic foreign body overlying the right side of the pubis near the pubic symphysis. No other radiopaque foreign bodies. Vascular stent noted overlying the left superior thigh region. IMPRESSION: *There is a single metallic foreign body overlying the right side of the pubis. Critical Value/emergent results were called by telephone at the time of interpretation on 11/30/2023 at 1:47 pm to provider ADINE MANLY , who verbally acknowledged these results. Electronically Signed   By: Ree Molt M.D.   On: 11/30/2023 13:48      LOS: 20 days   Ole Bourdon, NP [facility] Pager: [phone]  12/02/2023, 9:43 AM

## 2023-12-02 NOTE — Progress Notes (Signed)
 Daily Progress Note   Patient Name: Kayla Schmitt       Date: 12/02/2023 DOB: 03/26/1966  Age: 58 y.o. MRN#: [id number] Attending Physician: Gretta Leita SQUIBB, DO Primary Care Physician: [name], NP Admit Date: 11/12/2023  Reason for Consultation/Follow-up: Establishing goals of care  Subjective: Medical records reviewed including progress notes, labs, imaging. Patient assessed at the bedside.  She reports feeling fine today after extubation.  Discussed with RN.  Patient's mother is present visiting.  Created space and opportunity for patient and family's thoughts and feelings on patient's current illness. We discussed interval history since the initial GOC conversation held with my colleague and I explored whether her experiences over the weekend have altered her care preferences at all.  She tells me they have not changed. However, she was quick to state no several times when I asked if she would be open to re-intubation. Upon further conversation, she is not sure and then states that she would be open to short term trial of re-intubation. I encouraged continued reflection and conversation, ensuring decisions are within the context of the patient's values and GOCs. She does not wish to discuss anything else for today.  Questions and concerns addressed. PMT will continue to support holistically.   Length of Stay: 20   Physical Exam Vitals and nursing note reviewed.  Constitutional:      General: She is not in acute distress.    Appearance: She is ill-appearing.     Interventions: Nasal cannula in place.  HENT:     Head: Normocephalic.  Cardiovascular:     Rate and Rhythm: Normal rate.  Pulmonary:     Effort: Pulmonary effort is normal.  Neurological:     Mental Status: She is alert.  Psychiatric:        Behavior:  Behavior normal.        Thought Content: Thought content normal.             Vital Signs: BP (!) 95/57   Pulse (!) 105   Temp 99.2 F (37.3 C) (Oral)   Resp (!) 37   Ht 5' 4 (1.626 m)   Wt 86.1 kg   SpO2 97%   BMI 32.58 kg/m  SpO2: SpO2: 97 % O2 Device: O2 Device: (S) Bi-PAP O2 Flow Rate: O2 Flow Rate (L/min): 8 L/min      Palliative Assessment/Data: TBD   Palliative Care Assessment & Plan   Patient Profile: 58 y.o. female  with past medical history of ischemic cardiomyopathy (CAD, EF 25%, G2DD), moderate-severe aortic stenosis, mild-moderate MR, HTN, PAD, dyslipidemia, hypothyroidism, DM-2 (HbA1c 5.5%),  CKD-3a, GERD, Fe def anemia, admitted on 11/12/2023 with shortness of breath.   She had recently been hospitalized for necrotizing fasciitis requiring left BKA and was discharged to CIR for therapy.    She completed therapy 7/15 but then developed shortness of breath and had to be readmitted. Found to be in volume overload and with elevated WBCs. Difficulty maintaining fluid balance due to hypotension and renal function. Required CRRT from 7/19-7/22 for AKI. Intubated from 7/17-7/20. Attempted RHC 7/17 but unable to tolerate. Cards consulted and they note she has advanced HF with poor candidacy for advanced therapies. She also has declining hemoglobin with most recent 08/01 being 7.5 g/dl. WBCs elevated at 21 (down from 29.2 on 7/31, but WBCs were normal on 07/28 with rapid increase). On Rocephin  for UTI as of 7/30. BNP also significantly elevated at 2406.9. Underwent RHC today and was able to tolerate.   Today, patient states she just got back from her heart cath. She states she tolerated it well. She states she is feeling ok. She continues to have some SOB most notably orthopnea. She denies any pain.    PMT has been consulted to assist with goals of care conversation. Patient consents to discussion.   Assessment: Goals of care conversation AKI, improving Acute on chronic  HFrEF ICM Acute respiratory failure with hypoxia due to acute pulmonary edema   Hematuria, s/p cystoscopy & CBI  moderate-severe aortic stenosis mild-moderate MR   Recommendations/Plan: Continue full code/full scope treatment Patient is agreeable to time limited trial of aggressive care and hopes to improve but would not want prolonged time on life support PMT will continue to follow and support intermittently   Prognosis:  Guarded with high risk for decompensation  Discharge Planning: To Be Determined  Care plan was discussed with Patient, patient's mother, RN    Time Total: 50  Visit consisted of counseling and education dealing with the complex and emotionally intense issues of symptom management and palliative care in the setting of serious and potentially life-threatening illness. Greater than 50% of this time was spent counseling and coordinating care related to the above assessment and plan.  Personally spent 50 minutes in patient care including extensive chart review (labs, imaging, progress/consult notes, vital signs), medically appropraite exam, discussed with treatment team, education to patient, family, and staff, documenting clinical information, medication review and management, coordination of care, and available advanced directive documents.           Dontae Minerva P [name], PA-C  Palliative Medicine Team Team phone # [phone]  Thank you for allowing the Palliative Medicine Team to assist in the care of this patient. Please utilize secure chat with additional questions, if there is no response within 30 minutes please call the above phone number.  Palliative Medicine Team providers are available by phone from 7am to 7pm daily and can be reached through the team cell phone.  Should this patient require assistance outside of these hours, please call the patient's attending physician.

## 2023-12-02 NOTE — Progress Notes (Signed)
 Physical Therapy Treatment Patient Details Name: Kayla Schmitt MRN: [id number] DOB: 1965/06/23 Today's Date: 12/02/2023   History of Present Illness 58 y.o female admitted 7/15 from [facility] for SOB, CHF exacerbation. 7/17 attempted RHC but couldn't lie flat with pulmonary edema and decompensation requiring intubation. 7/19 CRRT. 7/20 extubation. Planned discharge 7/30 held due to leukocytosis, patient having dysuria. 8/2 underwent cystoscopy with clot evacuation, remained intubated. Extubated 8/4 on bipap. PMHx: admission 6/22-7/1 for LLE pain, sepsis, necrotizing fasciitis. 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 CAD, aortic stenosis, HTN, CHF, PAD, bil transmet amputation, HLD, hypothyroidsim, T2DM.    PT Comments  Progressing well towards acute goals which have been updated based on progress an potential. Min assist for bed mobility today. Able to sit EOB without issues, on bipap VSS throughout duration of visit. Tolerated multiple LE exercises. Requested to remain in bed but agreeable to transfer training next visit. Patient will continue to benefit from skilled physical therapy services to further improve independence with functional mobility.     If plan is discharge home, recommend the following: A lot of help with walking and/or transfers;A lot of help with bathing/dressing/bathroom;Assistance with cooking/housework;Assist for transportation;Help with stairs or ramp for entrance   Can travel by private vehicle        Equipment Recommendations  None recommended by PT    Recommendations for Other Services OT consult;Rehab consult     Precautions / Restrictions Precautions Precautions: Fall;Other (comment) Recall of Precautions/Restrictions: Impaired Precaution/Restrictions Comments: Contact precs; R transmet amputation, L BKA Required Braces or Orthoses: Other Brace Other Brace: Limb guard (benefits from encouragement to don) Restrictions Weight Bearing Restrictions Per Provider Order: Yes LLE Weight  Bearing Per Provider Order: Non weight bearing Other Position/Activity Restrictions: Date of surgery 6/24     Mobility  Bed Mobility Overal bed mobility: Needs Assistance Bed Mobility: Rolling, Sidelying to Sit, Sit to Sidelying Rolling: Used rails, Min assist Sidelying to sit: HOB elevated, Min assist     Sit to sidelying: Min assist General bed mobility comments: Min assist to roll and rise from left side, slow. Cues for technique, HOB elevated. Used bed pad to assist. Min assist for LEs into bed.    Transfers Overall transfer level: Needs assistance Equipment used: None               General transfer comment: Declined to transfer OOB today and RN requested EOB only. Pt agreeable to work on lateral scoots in bed, CGA, extra time, cues for technique    Ambulation/Gait                   Stairs             Wheelchair Mobility     Tilt Bed    Modified Rankin (Stroke Patients Only)       Balance Overall balance assessment: Needs assistance Sitting-balance support: No upper extremity supported, Feet unsupported Sitting balance-Leahy Scale: Fair Sitting balance - Comments: Sat EOB majority of session no assist.                                    Communication Communication Communication: Impaired Factors Affecting Communication: Other (comment) (on bipap)  Cognition Arousal: Alert Behavior During Therapy: WFL for tasks assessed/performed   PT - Cognitive impairments: No family/caregiver present to determine baseline  PT - Cognition Comments: Following instructions appropriately. a little delayed Following commands: Impaired Following commands impaired: Follows multi-step commands with increased time    Cueing Cueing Techniques: Verbal cues, Gestural cues  Exercises General Exercises - Lower Extremity Ankle Circles/Pumps: AROM, Right, 10 reps, Seated Gluteal Sets: Strengthening, Both, 10 reps,  Seated Long Arc Quad: Seated, Strengthening, Both, 10 reps Hip ABduction/ADduction: Strengthening, Both, 10 reps, Seated Hip Flexion/Marching: Strengthening, Both, 10 reps, Seated Amputee Exercises Knee Flexion: AROM, Left, Seated, 10 reps    General Comments General comments (skin integrity, edema, etc.): VSS throughout. on bipap, peep 8, fio2 40%. Reviewed limb guard for safety      Pertinent Vitals/Pain Pain Assessment Pain Assessment: No/denies pain    Home Living                          Prior Function            PT Goals (current goals can now be found in the care plan section) Acute Rehab PT Goals Patient Stated Goal: To go home but not wear myself out too much PT Goal Formulation: Patient unable to participate in goal setting Time For Goal Achievement: 12/16/23 Potential to Achieve Goals: Fair Progress towards PT goals: Progressing toward goals    Frequency    Min 2X/week      PT Plan      Co-evaluation              AM-PAC PT 6 Clicks Mobility   Outcome Measure  Help needed turning from your back to your side while in a flat bed without using bedrails?: A Little Help needed moving from lying on your back to sitting on the side of a flat bed without using bedrails?: A Little Help needed moving to and from a bed to a chair (including a wheelchair)?: A Lot Help needed standing up from a chair using your arms (e.g., wheelchair or bedside chair)?: Total Help needed to walk in hospital room?: Total Help needed climbing 3-5 steps with a railing? : Total 6 Click Score: 11    End of Session Equipment Utilized During Treatment: Oxygen ;Other (comment) (LLE limb guard placed then removed once pt back in supine) Activity Tolerance: Patient tolerated treatment well Patient left: with call bell/phone within reach;in bed;with bed alarm set;with SCD's reapplied Nurse Communication: Need for lift equipment;Mobility status PT Visit Diagnosis:  Unsteadiness on feet (R26.81);Other abnormalities of gait and mobility (R26.89);Muscle weakness (generalized) (M62.81);Difficulty in walking, not elsewhere classified (R26.2)     Time: 8396-8371 PT Time Calculation (min) (ACUTE ONLY): 25 min  Charges:    $Therapeutic Exercise: 8-22 mins $Therapeutic Activity: 8-22 mins PT General Charges $$ ACUTE PT VISIT: 1 Visit                     Leontine Roads, PT, DPT [facility]  Rehabilitation Services Physical Therapist Office: [phone] Website: [email or web address]    Leontine GORMAN Roads 12/02/2023, 4:46 PM

## 2023-12-02 NOTE — Procedures (Signed)
 Extubation Procedure Note  Patient Details:   Name: Kayla Schmitt DOB: 1966-02-16 MRN: [id number]   Airway Documentation:    Vent end date: 12/02/23 Vent end time: 0815   Evaluation  O2 sats: stable throughout Complications: No apparent complications Patient did tolerate procedure well. Bilateral Breath Sounds: Diminished, Rhonchi   Yes  Pt was extubated and placed on BIPAP 18/8 R10 40% per CCM NP order. Cuff leak was noted prior to extubation and no stridor post. Pt is tolerating BIPAP ok at this time. WOB is mild on BIPAP post extubation. NP at bedside to witness. RT will monitor.   Kayla Schmitt 12/02/2023, 8:24 AM

## 2023-12-02 NOTE — TOC CM/SW Note (Signed)
 12-02-2023  JEN  D. @ [employer] : CONTACT FOR D/C PLANNING NEEDS.ALSO  TO ASSIST WITH HOME HEALTH  AND DME. PHONE # [phone] X [phone] .

## 2023-12-02 NOTE — Progress Notes (Signed)
 eLink Physician-Brief Progress Note Patient Name: Kayla Schmitt DOB: May 15, 1965 MRN: [id number]   Date of Service  12/02/2023  HPI/Events of Note  K+ 3.Nephrologist on board for AKI on CKD. CHF. 2 with GFR 23   on vent with OG and c-line, might need CRRT .   eICU Interventions  Replacement ordered.      Intervention Category Intermediate Interventions: Electrolyte abnormality - evaluation and management  [name] 12/02/2023, 5:06 AM

## 2023-12-02 NOTE — Progress Notes (Signed)
 Nutrition Follow-up  DOCUMENTATION CODES:   Not applicable  INTERVENTION:   Calorie Count: If inadequate po intake over the next 48 hours, recommend considering Cortrak placement on 8/06  Recommend liberalizing diet to Regular  Ensure Plus High Protein po TID, each supplement provides 350 kcal and 20 grams of protein  Hold Juven while on Calorie count, until taking adequate po intake.   MVI with minerals  NUTRITION DIAGNOSIS:   Inadequate oral intake related to acute illness as evidenced by NPO status.  Continues but being addressed via supplements  GOAL:   Patient will meet greater than or equal to 90% of their needs  Not Met  MONITOR:   PO intake, Supplement acceptance, Labs, Weight trends  REASON FOR ASSESSMENT:   Consult, Ventilator Enteral/tube feeding initiation and management  ASSESSMENT:   58 yo female admitted with acute on chronic diastolic HF, cardigenic shock, AKI, moderate MR, moderate to severe aortic stenosis, MRSA bacteremia and acute respiratory failure requiring ventilation. 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  7/15 Admitted from CIR 7/17 Transferred to ICU, placed on BiPap and inotrope support, later intubated 7/18 TF initiated 7/19 CRRT initiated TF at 30 ml/hr but held due to emesis, later restarted at 10 ml/hr 7/20 Extubated 7/21 CL diet, minimal intake with poor mentation 7/22 CRRT stopped  7/25 Transferred out of ICU 7/31 Worsening AKI, worsening pulmonary edema with bilateral pleural effusions 8/02 OR with Urology: gross hematuria and clot retention with large volume fibrinous clot filling entire bladder requiring evacuation of clot (500 mL) and fulguration of bleeding, Remained on vent post-op 8/04 Extubated to BiPap  Extubated to BiPap this morning; noted plans for BIPap on and off today. Diet advanced per MD. No recorded po intake yet Remains on  Lasix  gtt  Noted pt needs CABG and SAVR evaluation once clinically stable  NPO since 8/02. Last recorded meal 7/27. Unclear if pt actually received meal trays on 8/01, NPO 7/31 then Renal/Carb Mod diet ordered at 0756 on 8/01 and then pt made NPO again at 0807. Diet resumed 1232 and NPO 8/02.   Recurrent AKI, now likely post-renal AKI related to bladder outlet obstruction from blood clot. Nephrology, no indication for RRT currently.  UOP 2.85 L in 24 hours  Current wt 86.1 kg, wt 88.2 kg yesterday. Lowest wt this admission 84.4 kg Wt on admission from CIR 92.5 kg (7/15)  Labs: BUN 110 (H) Creatinine 1.97 Sodium 142 (wdl) Potassium 3.4 (L) Phosphorus 5.7 (H) Magnesium  2.3 (wdl)  Meds: Ferrous sulfate  SS novolog  Semglee  BID MVI Midodrine   Diet Order:   Diet Order             Diet Heart Room service appropriate? Yes; Fluid consistency: Thin  Diet effective now                   EDUCATION NEEDS:   Not appropriate for education at this time  Skin:  Skin Assessment: Skin Integrity Issues: Skin Integrity Issues:: Other (Comment) Incisions: Recent L BKA post nec fasc (incision clean, minimal drainage, foam dressing in place per RN documentation) Other: Ecchymosis to b/l buttocks  Last BM:  8/03  Height:   Ht Readings from Last 1 Encounters:  12/02/23 5' 4 (1.626 m)    Weight:   Wt Readings from Last 1 Encounters:  12/02/23 86.1 kg    BMI:  Body mass index is  32.58 kg/m.  Estimated Nutritional Needs:   Kcal:  1800-2000 kcals  Protein:  90-105 g  Fluid:  1.5L   Betsey Finger MS, RDN, LDN, CNSC Registered Dietitian 3 Clinical Nutrition RD Inpatient Contact Info in Amion

## 2023-12-02 NOTE — Progress Notes (Signed)
 Patient ID: Kayla Schmitt, female   DOB: 1966/01/25, 58 y.o.   MRN: [id number] [facility] Progress Note   Assessment/ Plan:   1. Acute kidney Injury on chronic kidney disease stage III (baseline creatinine appears 1.3-1.6): Hemodynamically mediated in the setting of acute exacerbation of congestive heart failure, transiently on CRRT that was d/c on 7/22.  Had been transitioned from IV furosemide  80 mg to torsemide  80 mg twice daily on 7/27 (AM dose only on 7/28) but will fortunately became acutely short of breath on 7/31 with not surprisingly worsening renal function. Recent AKI likely obstructive from large bladder hematoma req declot / cysto SLowly improving, UOP excellent Cont supportive care, no RRT indicated Cont lasix  current dosing   2.  Acute exacerbation of congestive heart failure/cardiogenic shock: History of congestive heart failure with reduced ejection fraction and decompensation noted during this hospitalization.  Good UOP, AHF following 3.  Hyponatremia: resolved 4.  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  Per CCM 5.  Anemia: Hb stable 6. UTI: on Ceftriaxone   Subjective:   CBI held this AM, clear yellow urine currently 2.7L UOP reported SCr slightly improved, BUN remains up Furosemide  120 IV BID K 3.2, repleted already Mother at bedside, updated    Objective:   BP (!) 95/57   Pulse (!) 105   Temp 99.2 F (37.3 C) (Oral)   Resp (!) 37   Ht 5' 4 (1.626 m)   Wt 86.1 kg   SpO2 97%   BMI 32.58 kg/m   Intake/Output Summary (Last 24 hours) at 12/02/2023 0823 Last data filed at 12/02/2023 0700 Gross per 24 hour  Intake 940.01 ml  Output 2700 ml  Net -1759.99 ml   Weight change: -2.1 kg  Physical Exam: Gen: intubated CVS: Pulse regular rhythm, normal rate S1 and S2 normal Resp: on vent Abd: Soft, obese, nontender, bowel sounds normal Ext: Status post left BKA  and right forefoot amputation.  Trace edema over dependent areas.. GU: Urine clear yellow  Imaging: DG Abd 1 View Result Date: 12/01/2023 CLINICAL DATA:  [id number] Encounter for orogastric (OG) tube placement [id number] EXAM: ABDOMEN - 1 VIEW COMPARISON:  Chest x-ray 11/30/2023 FINDINGS: Partially visualized central venous catheter and endotracheal tubes. Enteric tube courses below the hemidiaphragm with tip collimated off view and side port overlying the expected region of the gastric lumen. The bowel gas pattern is normal. No radio-opaque calculi or other significant radiographic abnormality are seen. Vascular calcifications. Partially visualized lungs demonstrate persistent interstitial/airspace opacities and likely bilateral trace pleural effusions. IMPRESSION: 1. Enteric tube in good position.  Could be retracted by 8 cm. 2. Other lines and tubes only partially visualized and collimated off view. 3. Partially visualized lungs demonstrate persistent interstitial/airspace opacities and likely bilateral trace pleural effusions. Electronically Signed   By: Morgane  Naveau M.D.   On: 12/01/2023 12:52   DG Abd 1 View Result Date: 11/30/2023 CLINICAL DATA:  Nasogastric tube placement. EXAM: ABDOMEN - 1 VIEW COMPARISON:  11/19/2023 FINDINGS: Nasogastric tube is present with tip and side-port over the stomach in the left mid to upper abdomen. Bowel gas pattern is nonobstructive. There is a paucity bowel gas visualized. No free peritoneal air. Remainder of the exam is unchanged. IMPRESSION: 1. Nonobstructive bowel gas pattern. 2. Nasogastric tube with tip and side-port over the stomach in the left mid to upper abdomen. Electronically Signed   By: Toribio Agreste M.D.   On: 11/30/2023  17:30   DG CHEST PORT 1 VIEW Result Date: 11/30/2023 CLINICAL DATA:  Central line placement EXAM: PORTABLE CHEST 1 VIEW COMPARISON:  11/28/2023 FINDINGS: Single frontal view of the chest demonstrates endotracheal tube overlying tracheal air  column, tip approximately 3 cm above carina. Enteric catheter passes below diaphragm, tip excluded by collimation. Left internal jugular catheter tip overlies the SVC. Cardiac silhouette is stable. Widespread interstitial and ground-glass opacities are again seen throughout the lungs. Trace bilateral effusions again noted. No pneumothorax. IMPRESSION: 1. Support devices as above. 2. Multifocal bilateral airspace disease and trace bilateral effusions, without significant change. Electronically Signed   By: Ozell Daring M.D.   On: 11/30/2023 16:26   DG C-Arm 1-60 Min Result Date: 11/30/2023 CLINICAL DATA:  Cystoscopy EXAM: DG C-ARM 1-60 MIN COMPARISON:  None Available. FINDINGS: A single fluoroscopic image was obtained during the performance of the procedure and is provided for interpretation only. Image is obtained over the lower pelvis, with no gross radiographic abnormality identified. Please refer to the operative report. Fluoroscopy time: 23.6 seconds, 6.8 mGy IMPRESSION: 1. Intraoperative evaluation as above. Please refer to the operative report. Electronically Signed   By: Ozell Daring M.D.   On: 11/30/2023 15:17   DG Pelvis Portable Result Date: 11/30/2023 CLINICAL DATA:  [id number] Surgery, elective [id number]. EXAM: PORTABLE PELVIS 1-2 VIEWS COMPARISON:  Radiograph from earlier the same day. FINDINGS: Interval disappearance of previously seen semi circular thin metallic foreign body. No other radiopaque foreign body seen. IMPRESSION: *Interval disappearance of previously seen semi circular thin metallic foreign body. No other radiopaque foreign body. Critical Value/emergent results were called by telephone at the time of interpretation on 11/30/2023 at 2:28 pm to provider ADINE MANLY , who verbally acknowledged these results. Electronically Signed   By: Ree Molt M.D.   On: 11/30/2023 14:29   DG Pelvis 1-2 Views Result Date: 11/30/2023 CLINICAL DATA:  [id number] Surgery, elective [id number]. EXAM: PELVIS  - 1-2 VIEW COMPARISON:  None Available. FINDINGS: The provided image demonstrates An approximately 5-6 mm long half circular, thin metallic foreign body overlying the right side of the pubis near the pubic symphysis. No other radiopaque foreign bodies. Vascular stent noted overlying the left superior thigh region. IMPRESSION: *There is a single metallic foreign body overlying the right side of the pubis. Critical Value/emergent results were called by telephone at the time of interpretation on 11/30/2023 at 1:47 pm to provider ADINE MANLY , who verbally acknowledged these results. Electronically Signed   By: Ree Molt M.D.   On: 11/30/2023 13:48     Labs: BMET Recent Labs  Lab 11/26/23 0256 11/27/23 0235 11/28/23 0545 11/29/23 0230 11/29/23 1056 11/30/23 0739 11/30/23 1104 11/30/23 1348 11/30/23 1607 11/30/23 1710 12/01/23 0408 12/01/23 0410 12/01/23 1500 12/02/23 0353  NA 135 134* 136 136   < > 137   < > 137 139 139 138 138 140 142  K 3.5 3.6 3.5 4.6   < > 4.7   < > 4.4 3.9 3.9 4.1 4.1 4.0 3.2*  CL 96* 96* 96* 97*  --  96*  --   --   --   --   --  101 102 99  CO2 25 26 26 24   --  21*  --   --   --   --   --  22 23 26   GLUCOSE 148* 166* 153* 211*  --  198*  --   --   --   --   --  179*  103* 122*  BUN 93* 93* 92* 103*  --  120*  --   --   --   --   --  111* 111* 110*  CREATININE 2.10* 2.06* 1.99* 2.43*  --  2.50*  --   --   --   --   --  2.12* 2.08* 1.97*  CALCIUM  8.8* 9.0 9.2 9.5  --  9.6  --   --   --   --   --  8.7* 8.8* 8.8*  PHOS 3.1 2.6  --   --   --   --   --   --   --   --   --   --   --  5.7*   < > = values in this interval not displayed.   CBC Recent Labs  Lab 11/28/23 1501 11/29/23 0230 11/29/23 1056 11/30/23 0739 11/30/23 1104 11/30/23 1710 12/01/23 0408 12/01/23 0410 12/02/23 0353  WBC 29.2* 21.0*  --  16.4*  --   --   --  13.9* 11.5*  NEUTROABS 23.7* 14.4*  --   --   --   --   --   --   --   HGB 7.9* 7.5*   < > 7.9*   < > 9.5* 8.8* 8.7* 8.7*  HCT  25.1* 23.8*   < > 25.8*   < > 28.0* 26.0* 28.2* 28.6*  MCV 90.6 92.6  --  95.6  --   --   --  96.9 99.0  PLT 361 271  --  334  --   --   --  248 233   < > = values in this interval not displayed.    Medications:     sodium chloride    Intravenous Once   aspirin   81 mg Per Tube Daily   atorvastatin   40 mg Oral Daily   bethanechol   10 mg Oral TID   Chlorhexidine  Gluconate Cloth  6 each Topical Daily   feeding supplement  237 mL Oral Q1400   ferrous sulfate   325 mg Oral Q breakfast   gabapentin   300 mg Oral QHS   Gerhardt's butt cream   Topical BID   hydrocortisone  cream   Topical TID   insulin  aspart  0-15 Units Subcutaneous Q4H   insulin  glargine-yfgn  6 Units Subcutaneous BID   levothyroxine   150 mcg Oral Q0600   midodrine   5 mg Oral Q8H   multivitamin with minerals  1 tablet Oral Daily   nutrition supplement (JUVEN)  1 packet Oral BID AC & HS   mouth rinse  15 mL Mouth Rinse Q2H   pantoprazole   40 mg Oral Daily   sodium chloride  flush  10-40 mL Intracatheter Q12H

## 2023-12-02 NOTE — Progress Notes (Signed)
 Patient ID: Kayla Schmitt, female   DOB: April 05, 1966, 58 y.o.   MRN: [id number]     Advanced Heart Failure Rounding Note  Cardiologist: Vishnu SHAUNNA Maywood, MD  AHF Consulting MD: Dr. Cherrie Chief Complaint: Acute on chronic HFpEF / RV Failure Patient Profile   58 y.o. female with history of PAD with prior SFA stents and bilateral TMA, severe 3v CAD, MR/AS, DM II, anemia, HTN, HLD, hypothyroidism.  Admitted in 6/25 with MRSA bactermia and necrotizing fasciitis s/p left BKA. Transferred back to [facility] from rehab d/t acute on chronic CHF with concern for low-output and acute respiratory failure.  Subjective:    7/18: aborted cath for acute respiratory distress; intubated; DBA started 7/19: started on CRRT 7/20: extubated 7/21: hgb 6.5 s/p 1u RBCs 7/23: CRRT stopped 8/1: RHC (see below).  8/2: To OR for cystoscopy and declotting of bladder.  Bladder filled with clot, no obvious bladder lesion.  8/4 extubated  Extubated to BIPAP this am. Mild SOB. Off pressors. Has 3way Foley in. Urine now clear with no further hematuria.   CVP 10 co-ox 84% Scr 2.08 -> 1,97   Weight Range: 86.1 kg Body mass index is 32.58 kg/m.   Vital Signs:   Temp:  [97.3 F (36.3 C)-99.2 F (37.3 C)] 99.2 F (37.3 C) (08/04 0430) Pulse Rate:  [83-107] 105 (08/04 0700) Resp:  [17-50] 37 (08/04 0700) BP: (95-112)/(57-74) 95/57 (08/04 0759) SpO2:  [88 %-99 %] 97 % (08/04 0700) Arterial Line BP: (87-129)/(41-97) 107/51 (08/04 0700) FiO2 (%):  [40 %] 40 % (08/04 0816) Weight:  [86.1 kg] 86.1 kg (08/04 0404) Last BM Date : 12/01/23  Weight change: Filed Weights   11/29/23 0500 12/01/23 0330 12/02/23 0404  Weight: 85.3 kg 88.2 kg 86.1 kg   Intake/Output:  Intake/Output Summary (Last 24 hours) at 12/02/2023 1053 Last data filed at 12/02/2023 0911 Gross per 24 hour  Intake 690.47 ml  Output 2300 ml  Net -1609.53 ml    Physical Exam   General:  Sitting up in bed on Bipap HEENT: normal Neck:  supple.jvp 10  Cor:Reg high-pitched AS murmur no s3 Lungs: clear Abdomen: soft, nontender, nondistended. No hepatosplenomegaly. No bruits or masses. Good bowel sounds. Extremities: no cyanosis, clubbing, rash s/p L BKA R TMA 1+ edema Neuro: alert & orientedx3, cranial nerves grossly intact. moves all 4 extremities w/o difficulty. Affect pleasant   Telemetry   Sinus 90-110 Discussed warfarin dosing with PharmD personally.+PVCs Personally reviewed   Labs    CBC Recent Labs    12/01/23 0410 12/02/23 0353  WBC 13.9* 11.5*  HGB 8.7* 8.7*  HCT 28.2* 28.6*  MCV 96.9 99.0  PLT 248 233   Basic Metabolic Panel Recent Labs    91/96/74 1500 12/02/23 0353  NA 140 142  K 4.0 3.2*  CL 102 99  CO2 23 26  GLUCOSE 103* 122*  BUN 111* 110*  CREATININE 2.08* 1.97*  CALCIUM  8.8* 8.8*  MG  --  2.3  PHOS  --  5.7*   Liver Function Tests Recent Labs    11/30/23 0739  AST 25  ALT 19  ALKPHOS 80  BILITOT 1.2  PROT 7.8  ALBUMIN  3.2*    BNP (last 3 results) Recent Labs    11/07/23 1457 11/13/23 0521 11/29/23 0230  BNP 336.4* 1,626.5* 2,406.9*   Medications:    Scheduled Medications:  sodium chloride    Intravenous Once   aspirin   81 mg Per Tube Daily   atorvastatin   40 mg  Oral Daily   bethanechol   10 mg Oral TID   Chlorhexidine  Gluconate Cloth  6 each Topical Daily   feeding supplement  237 mL Oral TID BM   ferrous sulfate   325 mg Oral Q breakfast   gabapentin   300 mg Oral QHS   Gerhardt's butt cream   Topical BID   hydrocortisone  cream   Topical TID   insulin  aspart  0-15 Units Subcutaneous Q4H   insulin  glargine-yfgn  6 Units Subcutaneous BID   levothyroxine   150 mcg Oral Q0600   midodrine   5 mg Oral Q8H   multivitamin with minerals  1 tablet Oral Daily   nutrition supplement (JUVEN)  1 packet Oral BID AC & HS   mouth rinse  15 mL Mouth Rinse Q2H   pantoprazole   40 mg Oral Daily   sodium chloride  flush  10-40 mL Intracatheter Q12H   Infusions:  cefTRIAXone   (ROCEPHIN )  IV 2 g (12/02/23 0911)   furosemide  120 mg (12/02/23 0830)   potassium chloride  10 mEq (12/02/23 1000)   sodium chloride  irrigation      PRN Medications: acetaminophen , Gerhardt's butt cream, ipratropium-albuterol , lidocaine , mouth rinse, mouth rinse, mouth rinse, oxyCODONE , polyethylene glycol, prochlorperazine , senna-docusate, sodium chloride  flush  Assessment/Plan  Acute on chronic HFpEF>Mixed Shock; Cardiogenic/Septic - TEE 6/25: EF 60-65%, RV okay, mild to mod MR, moderate-severe AS with mG 27 mmHg and AVA 1.07 cm2 - TTE 11/15/23 LVEF 25% - Taken for [facility] 7/17, however developed flash pulmonary edema. Unable to lie flat for procedure.  - Repeat echo with EF 25-30%, G2DD, mild to mod MR, mod-severe AS  - GDMT limited by renal function and hypotension (SBP 80s-90s generally). On midodrine  5 mg tid with stable BP this morning.  - RHC on 8/1 showed severely elevated PCWP, mildly elevated RAP, preserved CO.  - Remains volume overloaded. CVP 10. Continue IV lasix  to keep lungs dry. Be careful not to overdiurese with AS - MAP stable off pressors, she is on midodrine  5 tid.  Will follow co-ox.  - GDMT limited by AKI and low BP  2. Acute respiratory failure with hypoxia - CXR 7/20 with b/l diffuse airspace opacities, likely pulmonary edema, though given fevers also concern for PNA - CXR 7/31 consistent with pulmonary edema.  - Intubated post-OR on 8/2.  CXR with pulmonary edema.  - Extubated today. Continue IV diuresis  3. CAD - NSTEMI in 4/24. Multivessel CAD. CABG had been recommended but managed medically due to other active medical issues - Further ischemic evaluation pending renal function => no cath with AKI - continue aspirin  + statin   4. Valvular heart disease - Mild to moderate MR and moderate to severe AS on echo 6/25 - Repeat echo 7/25 with extremely poor windows, based on exam expect AS moderate-severe - Echo 11/20/23 with Vmax 3.21, DVT 0.26, mod to severe AS, mG  22 - AS appears severe on exan but she is not a TAVR or SAVR candidate currently with infectious issues   5. MRSA bacteremia - Necrotizing fasciitis - S/p recent left BKA 6/25; S/p R TMA in 4/24  6. UTI - WBCs 21 => 13.9, on ceftriaxone  for UTI. Afebrile.   7. PAD - hx SFA stents - On aspirin  + statin. Had been on plavix  but stopped d/t risk of bleeding.   8. Anemia - s/p 1 u RBC 7/14 - Hx GI bleed from AVMs.  - fecal occult negative - Hematuria resolved currently - Hgb 8.7 today,stable   9. AKI  on CKD IIIa - In setting of volume overload/low-output.  - Started on CVVH 7/19, stopped 7/22.  - Some renal recovery and creatinine had been trending down, but BUN/creatinine trended back up in setting bladder outlet obstruction from extensive clots in bladder.  Now s/p cystoscopy and clot removal, good UOP and BUN/creatinine high but trending down SCr 2.08 -> 1.97 - Attempting diuresis as above with pulmonary edema and high CVP.   10. Hematuria - Has UTI by UA   - Ceftriaxone  as above.  - Developed hematuria => attempted CBI but foley stopped draining and unable to correct at bedside.  To OR 8/2 for cystoscopy, found extensive clot filling bladder and evacuated, no obvious bladder lesion.  Now draining clear urine. Urology has signed off  11. Hypokalemia - supp  CRITICAL CARE Performed by: Toribio Fuel  Total critical care time: 42 minutes  Critical care time was exclusive of separately billable procedures and treating other patients.  Critical care was necessary to treat or prevent imminent or life-threatening deterioration.  Critical care was time spent personally by me on the following activities: development of treatment plan with patient and/or surrogate as well as nursing, discussions with consultants, evaluation of patient's response to treatment, examination of patient, obtaining history from patient or surrogate, ordering and performing treatments and interventions,  ordering and review of laboratory studies, ordering and review of radiographic studies, pulse oximetry and re-evaluation of patient's condition.   Toribio Merriam Brandner 12/02/2023 10:53 AM

## 2023-12-03 ENCOUNTER — Inpatient Hospital Stay (HOSPITAL_COMMUNITY)

## 2023-12-03 DIAGNOSIS — E1165 Type 2 diabetes mellitus with hyperglycemia: Secondary | ICD-10-CM

## 2023-12-03 DIAGNOSIS — E876 Hypokalemia: Secondary | ICD-10-CM

## 2023-12-03 DIAGNOSIS — I5021 Acute systolic (congestive) heart failure: Secondary | ICD-10-CM

## 2023-12-03 DIAGNOSIS — R319 Hematuria, unspecified: Secondary | ICD-10-CM

## 2023-12-03 LAB — CBC
HCT: 28.5 % — ABNORMAL LOW (ref 36.0–46.0)
Hemoglobin: 8.4 g/dL — ABNORMAL LOW (ref 12.0–15.0)
MCH: 29.5 pg (ref 26.0–34.0)
MCHC: 29.5 g/dL — ABNORMAL LOW (ref 30.0–36.0)
MCV: 100 fL (ref 80.0–100.0)
Platelets: 225 K/uL (ref 150–400)
RBC: 2.85 MIL/uL — ABNORMAL LOW (ref 3.87–5.11)
RDW: 22.7 % — ABNORMAL HIGH (ref 11.5–15.5)
WBC: 8.2 K/uL (ref 4.0–10.5)
nRBC: 0 % (ref 0.0–0.2)

## 2023-12-03 LAB — BASIC METABOLIC PANEL WITH GFR
Anion gap: 14 (ref 5–15)
Anion gap: 15 (ref 5–15)
BUN: 94 mg/dL — ABNORMAL HIGH (ref 6–20)
BUN: 88 mg/dL — ABNORMAL HIGH (ref 6–20)
CO2: 28 mmol/L (ref 22–32)
CO2: 28 mmol/L (ref 22–32)
Calcium: 9 mg/dL (ref 8.9–10.3)
Calcium: 8.7 mg/dL — ABNORMAL LOW (ref 8.9–10.3)
Chloride: 100 mmol/L (ref 98–111)
Chloride: 96 mmol/L — ABNORMAL LOW (ref 98–111)
Creatinine, Ser: 1.68 mg/dL — ABNORMAL HIGH (ref 0.44–1.00)
Creatinine, Ser: 1.64 mg/dL — ABNORMAL HIGH (ref 0.44–1.00)
GFR, Estimated: 35 mL/min — ABNORMAL LOW (ref >60)
GFR, Estimated: 37 mL/min — ABNORMAL LOW (ref >60)
Glucose, Bld: 116 mg/dL — ABNORMAL HIGH (ref 70–99)
Glucose, Bld: 207 mg/dL — ABNORMAL HIGH (ref 70–99)
Potassium: 3.4 mmol/L — ABNORMAL LOW (ref 3.5–5.1)
Potassium: 4.1 mmol/L (ref 3.5–5.1)
Sodium: 142 mmol/L (ref 135–145)
Sodium: 139 mmol/L (ref 135–145)

## 2023-12-03 LAB — COOXEMETRY PANEL
Carboxyhemoglobin: 2.1 % — ABNORMAL HIGH (ref 0.5–1.5)
Carboxyhemoglobin: 3.1 % — ABNORMAL HIGH (ref 0.5–1.5)
Methemoglobin: <0.7 % (ref 0.0–1.5)
Methemoglobin: 0.9 % (ref 0.0–1.5)
O2 Saturation: 67.5 %
O2 Saturation: 72 %
Total hemoglobin: 9.2 g/dL — ABNORMAL LOW (ref 12.0–16.0)
Total hemoglobin: 12.7 g/dL (ref 12.0–16.0)

## 2023-12-03 LAB — GLUCOSE, CAPILLARY
Glucose-Capillary: 114 mg/dL — ABNORMAL HIGH (ref 70–99)
Glucose-Capillary: 119 mg/dL — ABNORMAL HIGH (ref 70–99)
Glucose-Capillary: 163 mg/dL — ABNORMAL HIGH (ref 70–99)
Glucose-Capillary: 209 mg/dL — ABNORMAL HIGH (ref 70–99)
Glucose-Capillary: 181 mg/dL — ABNORMAL HIGH (ref 70–99)

## 2023-12-03 LAB — SURGICAL PATHOLOGY

## 2023-12-03 MED ORDER — CLOPIDOGREL BISULFATE 75 MG PO TABS
75.0000 mg | ORAL_TABLET | Freq: Every day | ORAL | Status: DC
  Administered 2023-12-03 – 2023-12-06 (×4): 75 mg via ORAL
  Filled 2023-12-03 (×4): qty 1

## 2023-12-03 MED ORDER — POTASSIUM CHLORIDE 20 MEQ PO PACK
40.0000 meq | PACK | Freq: Two times a day (BID) | ORAL | Status: AC
  Administered 2023-12-03 (×2): 40 meq via ORAL
  Filled 2023-12-03 (×2): qty 2

## 2023-12-03 MED ORDER — HEPARIN SODIUM (PORCINE) 5000 UNIT/ML IJ SOLN
5000.0000 [IU] | Freq: Three times a day (TID) | INTRAMUSCULAR | Status: DC
  Administered 2023-12-03 – 2023-12-06 (×9): 5000 [IU] via SUBCUTANEOUS
  Filled 2023-12-03 (×9): qty 1

## 2023-12-03 MED ORDER — TORSEMIDE 20 MG PO TABS
80.0000 mg | ORAL_TABLET | Freq: Two times a day (BID) | ORAL | Status: DC
  Administered 2023-12-03 – 2023-12-06 (×6): 80 mg via ORAL
  Filled 2023-12-03 (×6): qty 4

## 2023-12-03 MED ORDER — INSULIN ASPART 100 UNIT/ML IJ SOLN
0.0000 [IU] | Freq: Three times a day (TID) | INTRAMUSCULAR | Status: DC
  Administered 2023-12-03: 5 [IU] via SUBCUTANEOUS
  Administered 2023-12-03 – 2023-12-04 (×4): 3 [IU] via SUBCUTANEOUS
  Administered 2023-12-05 (×3): 5 [IU] via SUBCUTANEOUS
  Administered 2023-12-06: 3 [IU] via SUBCUTANEOUS

## 2023-12-03 MED ORDER — POTASSIUM CHLORIDE 10 MEQ/50ML IV SOLN
10.0000 meq | INTRAVENOUS | Status: AC
  Administered 2023-12-03 (×4): 10 meq via INTRAVENOUS
  Filled 2023-12-03 (×4): qty 50

## 2023-12-03 NOTE — Progress Notes (Signed)
 Physical Therapy Treatment Patient Details Name: Kayla Schmitt MRN: [id number] DOB: [birth year] Today's Date: 12/03/2023   History of Present Illness 58 y.o female admitted 7/15 from [facility] for SOB, CHF exacerbation. 7/17 attempted RHC but couldn't lie flat with pulmonary edema and decompensation requiring intubation. 7/19 CRRT. 7/20 extubation. Planned discharge 7/30 held due to leukocytosis, patient having dysuria. 8/2 underwent cystoscopy with clot evacuation, remained intubated. Extubated 8/4 on bipap. PMHx: admission 6/22-7/1 for LLE pain, sepsis, necrotizing fasciitis. 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 CAD, aortic stenosis, HTN, CHF, PAD, bil transmet amputation, HLD, hypothyroidsim, T2DM.    PT Comments  Tolerated treatment well, eager to get out of bed. Worked on transfer training with min assist +2 for rightward squat pivot transfer, and mod assist +2 for leftward squat pivot transfer. Reviewed LE exercises, precautions, repositioning, and safety with mobility. Patient will continue to benefit from skilled physical therapy services to further improve independence with functional mobility.     If plan is discharge home, recommend the following: A lot of help with walking and/or transfers;A lot of help with bathing/dressing/bathroom;Assistance with cooking/housework;Assist for transportation;Help with stairs or ramp for entrance   Can travel by private vehicle        Equipment Recommendations  None recommended by PT    Recommendations for Other Services       Precautions / Restrictions Precautions Precautions: Fall;Other (comment) Recall of Precautions/Restrictions: Impaired Precaution/Restrictions Comments: Contact precs; R transmet amputation, L BKA Required Braces or Orthoses: Other Brace Other Brace: Limb guard (benefits from encouragement to don) Restrictions Weight Bearing Restrictions Per Provider Order: Yes LLE Weight Bearing Per Provider Order: Non weight bearing Other Position/Activity  Restrictions: Date of surgery 6/24     Mobility  Bed Mobility Overal bed mobility: Needs Assistance Bed Mobility: Supine to Sit Rolling: Min assist, +2 for physical assistance   Supine to sit: Min assist, +2 for physical assistance, HOB elevated     General bed mobility comments: Light min assist +2 for trunk support and to pull through therapist's hand ro rise to EOB. Able to scoot with cues to EOB.    Transfers Overall transfer level: Needs assistance Equipment used: 2 person hand held assist Transfers: Bed to chair/wheelchair/BSC Sit to Stand:  (stedy)     Squat pivot transfers: Min assist, +2 safety/equipment, Mod assist     General transfer comment: Required min assist +2 for squat pivot transfer towards Rt side from bed to chair x2, and Mod assist +2 for squat pivot transfer towards left side. Cues for set up, foot placement and anterior weight shift prior to squat. Pushes well through RLE. Supported on each side.    Ambulation/Gait                   Stairs             Wheelchair Mobility     Tilt Bed    Modified Rankin (Stroke Patients Only)       Balance Overall balance assessment: Needs assistance Sitting-balance support: No upper extremity supported, Feet unsupported Sitting balance-Leahy Scale: Fair Sitting balance - Comments: sits EOB with CGA   Standing balance support: Bilateral upper extremity supported, Reliant on assistive device for balance, During functional activity Standing balance-Leahy Scale: Poor                              Communication Communication Communication: No apparent difficulties  Cognition Arousal: Alert Behavior During Therapy: [facility]  for tasks assessed/performed   PT - Cognitive impairments: No apparent impairments                         Following commands: Impaired Following commands impaired: Follows multi-step commands with increased time    Cueing Cueing Techniques: Verbal  cues, Gestural cues  Exercises General Exercises - Lower Extremity Ankle Circles/Pumps: AROM, Right, 10 reps, Seated Quad Sets: Strengthening, Both, 10 reps, Seated Gluteal Sets: Strengthening, Both, 10 reps, Seated Long Arc Quad: Seated, Strengthening, Both, 10 reps Hip ABduction/ADduction: Strengthening, Both, 10 reps, Seated Amputee Exercises Knee Flexion: AROM, Left, Seated, 10 reps    General Comments General comments (skin integrity, edema, etc.): [name] initially present but stepped out during session      Pertinent Vitals/Pain Pain Assessment Pain Assessment: No/denies pain    Home Living                          Prior Function            PT Goals (current goals can now be found in the care plan section) Acute Rehab PT Goals Patient Stated Goal: To go home but not wear myself out too much PT Goal Formulation: With patient Time For Goal Achievement: 12/16/23 Potential to Achieve Goals: Fair Progress towards PT goals: Progressing toward goals    Frequency    Min 2X/week      PT Plan      Co-evaluation PT/OT/SLP Co-Evaluation/Treatment: Yes Reason for Co-Treatment: For patient/therapist safety;To address functional/ADL transfers PT goals addressed during session: Mobility/safety with mobility OT goals addressed during session: Proper use of Adaptive equipment and DME;Strengthening/ROM      AM-PAC PT 6 Clicks Mobility   Outcome Measure  Help needed turning from your back to your side while in a flat bed without using bedrails?: A Little Help needed moving from lying on your back to sitting on the side of a flat bed without using bedrails?: A Little Help needed moving to and from a bed to a chair (including a wheelchair)?: A Lot Help needed standing up from a chair using your arms (e.g., wheelchair or bedside chair)?: Total Help needed to walk in hospital room?: Total Help needed climbing 3-5 steps with a railing? : Total 6 Click Score:  11    End of Session Equipment Utilized During Treatment: Oxygen  Activity Tolerance: Patient tolerated treatment well Patient left: with call bell/phone within reach;in bed;in chair;with chair alarm set (Refused SCD) Nurse Communication: Mobility status (squat pivot transfer +2 assist) PT Visit Diagnosis: Unsteadiness on feet (R26.81);Other abnormalities of gait and mobility (R26.89);Muscle weakness (generalized) (M62.81);Difficulty in walking, not elsewhere classified (R26.2)     Time: 8771-8745 PT Time Calculation (min) (ACUTE ONLY): 26 min  Charges:    $Therapeutic Activity: 8-22 mins PT General Charges $$ ACUTE PT VISIT: 1 Visit                     Leontine Roads, PT, DPT [facility]  Rehabilitation Services Physical Therapist Office: [phone] Website: [email or web address]    Leontine GORMAN Roads 12/03/2023, 1:32 PM

## 2023-12-03 NOTE — Progress Notes (Signed)
 Patient ID: Kayla Schmitt, female   DOB: 05-Dec-1965, 58 y.o.   MRN: [id number] [facility] Progress Note   Assessment/ Plan:   1. Acute kidney Injury on chronic kidney disease stage III (baseline creatinine appears 1.3-1.6): resolving Recent AKI likely obstructive from large bladder hematoma req declot / cysto Improving, UOP excellent Cont supportive care, no RRT indicated Cont lasix  current dosing  2.  Acute exacerbation of congestive heart failure/cardiogenic shock: History of congestive heart failure with reduced ejection fraction and decompensation noted during this hospitalization.  Good UOP, AHF following 3.  Hyponatremia: resolved 4.  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  Per CCM 5.  Anemia: Hb stable 6. UTI: ABX per CCM  Subjective:   3.8L UOP, no further hematuria SCr down to 1.7 this AM AHF already changed to torsemide     Objective:   BP 109/60 (BP Location: Right Arm)   Pulse 80   Temp 97.9 F (36.6 C) (Oral)   Resp 18   Ht 5' 4 (1.626 m)   Wt 85.4 kg   SpO2 98%   BMI 32.32 kg/m   Intake/Output Summary (Last 24 hours) at 12/03/2023 1144 Last data filed at 12/03/2023 0800 Gross per 24 hour  Intake 179.33 ml  Output 2950 ml  Net -2770.67 ml   Weight change: -0.7 kg  Physical Exam: Gen: intubated CVS: Pulse regular rhythm, normal rate S1 and S2 normal Resp: on vent Abd: Soft, obese, nontender, bowel sounds normal Ext: Status post left BKA and right forefoot amputation.  Trace edema over dependent areas.. GU: Urine clear yellow  Imaging: DG Chest Port 1 View Result Date: 12/03/2023 CLINICAL DATA:  Pulmonary edema EXAM: PORTABLE CHEST 1 VIEW COMPARISON:  Chest radiograph dated 11/30/2023 FINDINGS: Lines/tubes: Interval extubation and removal of enteric tube. Left internal jugular venous catheter tip projects over the confluence of brachiocephalic veins and  SVC. Lungs: Low lung volumes. Similar diffuse interstitial and bibasilar patchy opacities. Pleura: Similar small to moderate bilateral pleural effusions. No pneumothorax. Heart/mediastinum: Similar enlarged cardiomediastinal silhouette. Bones: No acute osseous abnormality. IMPRESSION: 1. Interval extubation and removal of enteric tube. 2. Similar diffuse interstitial and bibasilar patchy opacities, likely pulmonary edema. 3. Similar small to moderate bilateral pleural effusions. Electronically Signed   By: Limin  Xu M.D.   On: 12/03/2023 11:27   DG Abd 1 View Result Date: 12/01/2023 CLINICAL DATA:  [id number] Encounter for orogastric (OG) tube placement [id number] EXAM: ABDOMEN - 1 VIEW COMPARISON:  Chest x-ray 11/30/2023 FINDINGS: Partially visualized central venous catheter and endotracheal tubes. Enteric tube courses below the hemidiaphragm with tip collimated off view and side port overlying the expected region of the gastric lumen. The bowel gas pattern is normal. No radio-opaque calculi or other significant radiographic abnormality are seen. Vascular calcifications. Partially visualized lungs demonstrate persistent interstitial/airspace opacities and likely bilateral trace pleural effusions. IMPRESSION: 1. Enteric tube in good position.  Could be retracted by 8 cm. 2. Other lines and tubes only partially visualized and collimated off view. 3. Partially visualized lungs demonstrate persistent interstitial/airspace opacities and likely bilateral trace pleural effusions. Electronically Signed   By: Morgane  Naveau M.D.   On: 12/01/2023 12:52     Labs: BMET Recent Labs  Lab 11/27/23 0235 11/28/23 0545 11/29/23 0230 11/29/23 1056 11/30/23 0739 11/30/23 1104 11/30/23 1607 11/30/23 1710 12/01/23 0408 12/01/23 0410 12/01/23 1500 12/02/23 0353 12/02/23 1212 12/03/23 0502  NA 134* 136 136   < >  137   < > 139 139 138 138 140 142  --  142  K 3.6 3.5 4.6   < > 4.7   < > 3.9 3.9 4.1 4.1 4.0 3.2* 3.4* 3.4*   CL 96* 96* 97*  --  96*  --   --   --   --  101 102 99  --  100  CO2 26 26 24   --  21*  --   --   --   --  22 23 26   --  28  GLUCOSE 166* 153* 211*  --  198*  --   --   --   --  179* 103* 122*  --  116*  BUN 93* 92* 103*  --  120*  --   --   --   --  111* 111* 110*  --  94*  CREATININE 2.06* 1.99* 2.43*  --  2.50*  --   --   --   --  2.12* 2.08* 1.97*  --  1.68*  CALCIUM  9.0 9.2 9.5  --  9.6  --   --   --   --  8.7* 8.8* 8.8*  --  9.0  PHOS 2.6  --   --   --   --   --   --   --   --   --   --  5.7*  --   --    < > = values in this interval not displayed.   CBC Recent Labs  Lab 11/28/23 1501 11/29/23 0230 11/29/23 1056 11/30/23 0739 11/30/23 1104 12/01/23 0408 12/01/23 0410 12/02/23 0353 12/03/23 0502  WBC 29.2* 21.0*  --  16.4*  --   --  13.9* 11.5* 8.2  NEUTROABS 23.7* 14.4*  --   --   --   --   --   --   --   HGB 7.9* 7.5*   < > 7.9*   < > 8.8* 8.7* 8.7* 8.4*  HCT 25.1* 23.8*   < > 25.8*   < > 26.0* 28.2* 28.6* 28.5*  MCV 90.6 92.6  --  95.6  --   --  96.9 99.0 100.0  PLT 361 271  --  334  --   --  248 233 225   < > = values in this interval not displayed.    Medications:     sodium chloride    Intravenous Once   aspirin   81 mg Per Tube Daily   atorvastatin   40 mg Oral Daily   bethanechol   10 mg Oral TID   Chlorhexidine  Gluconate Cloth  6 each Topical Daily   clopidogrel   75 mg Oral Daily   feeding supplement  237 mL Oral TID BM   ferrous sulfate   325 mg Oral Q breakfast   gabapentin   300 mg Oral QHS   Gerhardt's butt cream   Topical BID   heparin  injection (subcutaneous)  5,000 Units Subcutaneous Q8H   hydrocortisone  cream   Topical TID   insulin  aspart  0-15 Units Subcutaneous TID WC   insulin  glargine-yfgn  6 Units Subcutaneous BID   levothyroxine   150 mcg Oral Q0600   midodrine   5 mg Oral Q8H   multivitamin with minerals  1 tablet Oral Daily   pantoprazole   40 mg Oral Daily   potassium chloride   40 mEq Oral BID   sodium chloride  flush  10-40 mL Intracatheter  Q12H   torsemide   80 mg Oral BID

## 2023-12-03 NOTE — Addendum Note (Signed)
 Addendum  created 12/03/23 1006 by Leonce Athens, MD   Clinical Note Signed, Intraprocedure Blocks edited

## 2023-12-03 NOTE — Progress Notes (Signed)
 Occupational Therapy Treatment Patient Details Name: Kayla Schmitt MRN: [id number] DOB: Mar 24, 1966 Today's Date: 12/03/2023   History of present illness 58 y.o female admitted 7/15 from [facility] for SOB, CHF exacerbation. 7/17 attempted RHC but couldn't lie flat with pulmonary edema and decompensation requiring intubation. 7/19 CRRT. 7/20 extubation. Planned discharge 7/30 held due to leukocytosis, patient having dysuria. 8/2 underwent cystoscopy with clot evacuation, remained intubated. Extubated 8/4 on bipap. PMHx: admission 6/22-7/1 for LLE pain, sepsis, necrotizing fasciitis. 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 CAD, aortic stenosis, HTN, CHF, PAD, bil transmet amputation, HLD, hypothyroidsim, T2DM.   OT comments  Pt seen in conjunction with PT to progress initial OOB transfers since most recent extubation. With emphasis on squat pivot transfers and cues for technique, pt able to complete with Min-Mod A x 2. Pt remains aimed to discharge home with [facility] but is open to consideration other post acute rehab options if unable to transfer without assist by time of DC as pt's family unable to provide physical assistance. Will continue to follow acutely.  BP pre activity: 85/54 BP post activity: 95/57 SpO2 WFL on 2 L O2      If plan is discharge home, recommend the following:  A lot of help with walking and/or transfers;A lot of help with bathing/dressing/bathroom   Equipment Recommendations  None recommended by OT    Recommendations for Other Services      Precautions / Restrictions Precautions Precautions: Fall;Other (comment) Recall of Precautions/Restrictions: Impaired Precaution/Restrictions Comments: R transmet amputation, L BKA Required Braces or Orthoses: Other Brace Other Brace: Limb guard Restrictions Weight Bearing Restrictions Per Provider Order: Yes LLE Weight Bearing Per Provider Order: Non weight bearing       Mobility Bed Mobility Overal bed mobility: Needs Assistance Bed Mobility:  Supine to Sit Rolling: Used rails, Min assist         General bed mobility comments: Min A to lift trunk to EOB    Transfers Overall transfer level: Needs assistance Equipment used: None Transfers: Bed to chair/wheelchair/BSC     Squat pivot transfers: Min assist, Mod assist, +2 safety/equipment       General transfer comment: Squat pivot to drop arm recliner in room. Min A x 2 when transferring to R side and Mod A x 2 when transferring to L side (and slightly uphill to bed). Step by step sequencing and positional cues needed.     Balance Overall balance assessment: Needs assistance Sitting-balance support: No upper extremity supported, Feet unsupported Sitting balance-Leahy Scale: Fair                                     ADL either performed or assessed with clinical judgement   ADL Overall ADL's : Needs assistance/impaired                                       General ADL Comments: Emphasis on initial transfer OOB since extubation. Education and discussion of DC options, assistance currently needed and available assistance at home    Extremity/Trunk Assessment Upper Extremity Assessment Upper Extremity Assessment: Generalized weakness;Right hand dominant   Lower Extremity Assessment Lower Extremity Assessment: Defer to PT evaluation        Vision   Vision Assessment?: No apparent visual deficits   Perception     Praxis     Communication Communication  Communication: No apparent difficulties   Cognition Arousal: Alert Behavior During Therapy: WFL for tasks assessed/performed Cognition: No family/caregiver present to determine baseline             OT - Cognition Comments: STM deficits present; unsure if baseline                 Following commands: Impaired Following commands impaired: Follows multi-step commands with increased time      Cueing   Cueing Techniques: Verbal cues, Gestural cues  Exercises       Shoulder Instructions       General Comments [name] initially present but stepped out during session    Pertinent Vitals/ Pain       Pain Assessment Pain Assessment: No/denies pain  Home Living                                          Prior Functioning/Environment              Frequency  Min 2X/week        Progress Toward Goals  OT Goals(current goals can now be found in the care plan section)  Progress towards OT goals: Progressing toward goals  Acute Rehab OT Goals Patient Stated Goal: be able to go home instead of rehab OT Goal Formulation: With patient Time For Goal Achievement: 12/10/23 Potential to Achieve Goals: Good ADL Goals Pt Will Perform Lower Body Dressing: with supervision;sit to/from stand;sitting/lateral leans Pt Will Transfer to Toilet: with supervision;bedside commode;with transfer board;squat pivot transfer;stand pivot transfer Pt Will Perform Toileting - Clothing Manipulation and hygiene: with supervision;sitting/lateral leans  Plan      Co-evaluation    PT/OT/SLP Co-Evaluation/Treatment: Yes Reason for Co-Treatment: For patient/therapist safety;To address functional/ADL transfers PT goals addressed during session: Mobility/safety with mobility OT goals addressed during session: Proper use of Adaptive equipment and DME;Strengthening/ROM      AM-PAC OT 6 Clicks Daily Activity     Outcome Measure   Help from another person eating meals?: None Help from another person taking care of personal grooming?: A Little Help from another person toileting, which includes using toliet, bedpan, or urinal?: A Lot Help from another person bathing (including washing, rinsing, drying)?: A Lot Help from another person to put on and taking off regular upper body clothing?: A Little Help from another person to put on and taking off regular lower body clothing?: A Lot 6 Click Score: 16    End of Session Equipment Utilized During  Treatment: Oxygen ;Gait belt  OT Visit Diagnosis: Unsteadiness on feet (R26.81);Other abnormalities of gait and mobility (R26.89);Muscle weakness (generalized) (M62.81)   Activity Tolerance Patient tolerated treatment well   Patient Left in chair;with call bell/phone within reach   Nurse Communication Mobility status        Time: 8773-8742 OT Time Calculation (min): 31 min  Charges: OT General Charges $OT Visit: 1 Visit OT Treatments $Therapeutic Activity: 8-22 mins  Mliss NOVAK, OTR/L Acute Rehab Services Office: [phone]   Mliss Fish 12/03/2023, 1:22 PM

## 2023-12-03 NOTE — Progress Notes (Signed)
 NAME:  Kayla Schmitt, MRN:  [id number], DOB:  08-27-1965, LOS: 21 ADMISSION DATE:  11/12/2023, CONSULTATION DATE: 11/28/2023 REFERRING MD: Franky Redia SAILOR, MD, CHIEF COMPLAINT: Hypoxia and hypotension    History of Present Illness:  A 58 year old female with ischemic cardiomyopathy (CAD, EF 25%, G2DD), moderate-severe aortic stenosis, mild-moderate MR, HTN, PAD, dyslipidemia, hypothyroidism, DM-2 (HbA1c 5.5%), CKD-3a, GERD, Fe def anemia, UTI on Rx 11/27/2023, PNA s/p Abx course for 7 days, AKI (s/p CRRT 7/19-22/2025), short term intubation (7/17-20, 2025), and recent nec fasc s/p left BKA 6/25, completed abx.  who was in CIR then developed worsening SOB so admitted back to [facility] 11/12/2023. Failed RHC 7/17 due to orthopnea. Developed gross hematuria and foley cath bladder irrigation. Hospitalist called PCCM for worsening hypoxia (3 L [place] to 6 L [place]), MAP 58 mmHg, and worsening SOB.   Pertinent  Medical History  Ischemic cardiomyopathy (CAD, EF 25%, G2DD), moderate-severe aortic stenosis, mild-moderate MR, HTN, PAD, dyslipidemia, hypothyroidism, DM-2, CKD-3a, GERD, Fe def anemia, nec fasc s/p left BKA  Significant Hospital Events: Including procedures, antibiotic start and stop dates in addition to other pertinent events   7/16 admission (PNA and HF working dx)->most likely flash edema  7/17 ICU, intubated 7/18 started spiking fever with Tmax 101.5, tachycardic.  X-ray chest still showing bilateral infiltrate but FiO2 is coming down.  Lasix  infusion increased to 30 mg/h 7/19 started on CRRT, tolerating spontaneous breathing trial 7/20 extubated 7/22 CRRT holiday, Levophed /dobutamine  7/23 lasix  challenge, transfused 1u PRBC, remains low dose NE, DBA 2.5, CVP 7 7/24 laisx, off DBA 7/25: PCCM signed off 7/31: PCCM was called for eval. PCXR: worse pulm edema and bilateral pleural effusions. Negative 1.6 L fluid balance in the last 3 days 8/2 started with hematuria, Foley is leaking, urology  unable to irrigate the bladder, went to the OR, underwent cystoscopy which showed large clot in urinary bladder no lesion no bladder perf. Clot was evacuated, placed on CBI. Etiology of clot not clear. Urology hypothesizing UTI  8/3 remained on vent. CBI continued  8/4 passed SBT extubated to BIPAP. CVP ~10. Clear liq diet started. Coughing intermittently SLP eval requested.   Interim History / Subjective:  No distress, wants to eat  Objective    Blood pressure 107/63, pulse 85, temperature 97.6 F (36.4 C), temperature source Axillary, resp. rate 18, height 5' 4 (1.626 m), weight 85.4 kg, SpO2 97%. CVP:  [5 mmHg-15 mmHg] 10 mmHg  Vent Mode: PCV;BIPAP FiO2 (%):  [40 %] 40 % Set Rate:  [10 bmp] 10 bmp PEEP:  [5 cmH20-8 cmH20] 8 cmH20 Pressure Support:  [5 cmH20-8 cmH20] 8 cmH20 Plateau Pressure:  [15 cmH20] 15 cmH20   Intake/Output Summary (Last 24 hours) at 12/03/2023 0719 Last data filed at 12/03/2023 0600 Gross per 24 hour  Intake 339.7 ml  Output 3825 ml  Net -3485.3 ml   Filed Weights   12/01/23 0330 12/02/23 0404 12/03/23 0400  Weight: 88.2 kg 86.1 kg 85.4 kg    Examination: General sitting up in bed she is in no distress HENT NCAT no JVD  Pulm dec bases no accessory use. Currently on 4 lpm high flow Pcxr ett good position. Diffuse edema persists  Card RRR + systolic HM c/w her known AS Abd soft Ext warm the left BKA stump is CD&I site well approximated and not concerning for infection old right TMT site unremarkable  Neuro awake and oriented no focal def  Gu cl yellow   Resolved problem  list   Assessment and Plan  Acute hypoxic respiratory failure due to recurrent acute pulmonary edema Extubated to BIPAP and cycling on and off [place] Plan Repeat CXR today  Dc bipap Cont PT efforts and mobilization  Swallow eval to r/o dysphagia given coughing intermittently w/ liquids  Cont pulse ox w/ supplemental oxygen  sat goal > 92%  Cont diuresis as long as BP/BUN/cr allow  recognizing underlying AS and volume sensitivity  Cont spirometry   Acute on chronic combined systolic and diastolic CHF  (EF 25%) Moderate-severe aortic stenosis, mild-moderate MR Multivessel coronary artery disease -She was deemed not candidate for TAVR or SAVR earlier in stay given wound  -GDMT limited by renal fxn -Advanced heart failure team is following plan Cont current diuretic strategy as long as BUN/cr and hemodynamics allow; currently on lasix   (careful clinical obs of volume status) Cont midodrine  to ensure adequate MAP and coronary perfusion  Cont tele  Continue aspirin  and statin Try to avoid tachycardia Patient will need CABG or SAVR if we hope to keep her out of hospital or really even ICU, unfortunately her physical deconditioning remains a barrier also recent infection. Would need to be 1 month w/out infection  and have normalized renal fxn before could be considered for valve procedure. Given her PVD also concerned that access may be limited and she would require extensive vascular assessment   AKI on CKD-3a due to cardiorenal syndrome Gross hematuria felt due UTI Serum creatinine started trending down after she went to the OR and had cystoscopy and evacuation of large blood clot from bladder on 2nd, stopped CBI on 4th. UOP remains clear plan Continue diuresis (as directed by HF team) Monitor intake and output Avoid nephrotoxic agent Continue ceftriaxone  to complete 5 days therapy (dc after 8/5 dose today)  Hematuria is resolved. Resuming her plavix  and [place] heparin    Fluid and electrolyte imbalance: hypokalemia  Plan Replaced and recheck as indicated  DM-2 (HbA1c 5.5%) Excellent glycemic control over last 24h Plan Cont mod ssi Semglee  6 units q 12 Goal 140-180   Hypothyroidism plan Continue levothyroxine   Anemia of chronic disease Patient received 1 unit PRBC 8/2 Stable  Plan Monitor H&H and transfuse if less than 7  Best Practice (right click and  Reselect all SmartList Selections daily)   Diet/type: clears but awaiting SLP eval as of 8/6 DVT prophylaxis SCD Pressure ulcer(s): Please see nursing notes GI prophylaxis: H2B Lines: N/A Foley:  Yes, and it is still needed Code Status:  full code Last date of multidisciplinary goals of care discussion [8/1: Patient and her mother both were updated at bedside, decision was to continue full scope of care]  My NA  Ready for xfer to progress.   Maude FORBES Banner ACNP-BC [facility] Pulmonary/Critical Care Pager # [phone] OR # [phone] if no answer

## 2023-12-03 NOTE — Progress Notes (Signed)
 [facility] ADULT ICU REPLACEMENT PROTOCOL   The patient does apply for the [facility] Adult ICU Electrolyte Replacment Protocol based on the criteria listed below:   1.Exclusion criteria: TCTS, ECMO, Dialysis, and Myasthenia Gravis patients 2. Is GFR >/= 30 ml/min? Yes.    Patient's GFR today is 35 3. Is SCr </= 2? Yes.   Patient's SCr is 1.68 mg/dL 4. Did SCr increase >/= 0.5 in 24 hours? No. 5.Pt's weight >40kg  Yes.   6. Abnormal electrolyte(s): K 3.4  7. Electrolytes replaced per protocol 8.  Call MD STAT for K+ </= 2.5, Phos </= 1, or Mag </= 1 Physician:  Joelyn SHIN, Reata Petrov A 12/03/2023 6:36 AM

## 2023-12-03 NOTE — Progress Notes (Signed)
 No noted respiratory distress at this time, BiPAP not needed. Pt 95% SpO2 on 4L HFNC.

## 2023-12-03 NOTE — Evaluation (Signed)
 Clinical/Bedside Swallow Evaluation Patient Details  Name: Kayla Schmitt MRN: [id number] Date of Birth: February 27, 1966  Today's Date: 12/03/2023 Time: SLP Start Time (ACUTE ONLY): 1558 SLP Stop Time (ACUTE ONLY): 1610 SLP Time Calculation (min) (ACUTE ONLY): 12 min  Past Medical History:  Past Medical History:  Diagnosis Date   Anemia    Aortic stenosis    Arthritis    CHF (congestive heart failure) (HCC)    Coronary artery disease    Diabetes mellitus without complication (HCC)    type 2   GERD (gastroesophageal reflux disease)    Heart murmur    History of blood transfusion    Hypercholesteremia    Hypertension    Hypothyroidism    Mitral regurgitation    Myocardial infarction (HCC)    Peripheral vascular disease (HCC)    PONV (postoperative nausea and vomiting)    Thyroid disease    Past Surgical History:  Past Surgical History:  Procedure Laterality Date   ABDOMINAL AORTOGRAM W/LOWER EXTREMITY Bilateral 01/14/2020   Procedure: ABDOMINAL AORTOGRAM W/LOWER EXTREMITY;  Surgeon: Gretta Lonni PARAS, MD;  [facility]: [facility];  Service: Cardiovascular;  Laterality: Bilateral;   ABDOMINAL AORTOGRAM W/LOWER EXTREMITY N/A 07/05/2022   Procedure: ABDOMINAL AORTOGRAM W/LOWER EXTREMITY;  Surgeon: Gretta Lonni PARAS, MD;  Location: [facility];  Service: Cardiovascular;  Laterality: N/A;   ABDOMINAL AORTOGRAM W/LOWER EXTREMITY N/A 08/02/2022   Procedure: ABDOMINAL AORTOGRAM W/LOWER EXTREMITY;  Surgeon: Gretta Lonni PARAS, MD;  Location: [facility];  Service: Cardiovascular;  Laterality: N/A;   ABDOMINAL AORTOGRAM W/LOWER EXTREMITY N/A 08/06/2022   Procedure: ABDOMINAL AORTOGRAM W/LOWER EXTREMITY;  Surgeon: Gretta Lonni PARAS, MD;  [facility]: [facility];  Service: Cardiovascular;  Laterality: N/A;   AMPUTATION Bilateral 07/20/2022   Procedure: AMPUTATION RIGHT GREAT TOE AND SECOND TOE, AMPUTATION LEFT GREAT TOE;  Surgeon: Harden Jerona GAILS, MD;  Location:  [facility] OR;  Service: Orthopedics;  Laterality: Bilateral;   AMPUTATION Bilateral 08/08/2022   Procedure: BILATERAL TRANSMETATARSAL AMPUTATION;  Surgeon: Harden Jerona GAILS, MD;  Location: [facility] OR;  Service: Orthopedics;  Laterality: Bilateral;   AMPUTATION Left 10/22/2023   Procedure: AMPUTATION BELOW KNEE;  Surgeon: Lanis Fonda BRAVO, MD;  Location: [facility] OR;  Service: Vascular;  Laterality: Left;   AMPUTATION TOE     BIOPSY  04/06/2023   Procedure: BIOPSY;  Surgeon: Di Elspeth SQUIBB, MD;  Location: [facility] ENDOSCOPY;  Service: Gastroenterology;;   COLONOSCOPY WITH PROPOFOL  N/A 04/06/2023   Procedure: COLONOSCOPY WITH PROPOFOL ;  Surgeon: Nhyla Elspeth SQUIBB, MD;  Location: [facility] ENDOSCOPY;  Service: Gastroenterology;  Laterality: N/A;   CYSTOSCOPY WITH STENT PLACEMENT N/A 11/30/2023   Procedure: CYSTOSCOPY, WITH FULGURATION OF BLEEDING AND CLOT EVACUATION;  Surgeon: Roseann Adine PARAS., MD;  Location: [facility] OR;  Service: Urology;  Laterality: N/A;  CLOT EVACUATION OF BLADDER   ESOPHAGOGASTRODUODENOSCOPY (EGD) WITH PROPOFOL  N/A 04/06/2023   Procedure: ESOPHAGOGASTRODUODENOSCOPY (EGD) WITH PROPOFOL ;  Surgeon: Loreal Elspeth SQUIBB, MD;  Location: [facility] ENDOSCOPY;  Service: Gastroenterology;  Laterality: N/A;   GIVENS CAPSULE STUDY N/A 04/06/2023   Procedure: GIVENS CAPSULE STUDY;  Surgeon: Kattaleya Elspeth SQUIBB, MD;  Location: [facility] ENDOSCOPY;  Service: Gastroenterology;  Laterality: N/A;   PERIPHERAL VASCULAR ATHERECTOMY Right 01/14/2020   Procedure: PERIPHERAL VASCULAR ATHERECTOMY;  Surgeon: Gretta Lonni PARAS, MD;  Location: [facility] INVASIVE CV LAB;  Service: Cardiovascular;  Laterality: Right;  sfa   PERIPHERAL VASCULAR INTERVENTION Right 01/14/2020   Procedure: PERIPHERAL VASCULAR INTERVENTION;  Surgeon: Gretta Lonni PARAS, MD;  Location: [facility]  INVASIVE CV LAB;  Service: Cardiovascular;  Laterality: Right;  sfa stent    PERIPHERAL VASCULAR INTERVENTION  07/05/2022   Procedure: PERIPHERAL VASCULAR INTERVENTION;  Surgeon: Gretta Lonni PARAS, MD;  [facility];  Service: Cardiovascular;;   PERIPHERAL VASCULAR INTERVENTION  08/02/2022   Procedure: PERIPHERAL VASCULAR INTERVENTION;  Surgeon: Gretta Lonni PARAS, MD;  [facility];  Service: Cardiovascular;;   PERIPHERAL VASCULAR INTERVENTION  08/06/2022   Procedure: PERIPHERAL VASCULAR INTERVENTION;  Surgeon: Gretta Lonni PARAS, MD;  Location: [facility] INVASIVE CV LAB;  Service: Cardiovascular;;   PERIPHERAL VASCULAR THROMBECTOMY  08/02/2022   Procedure: PERIPHERAL VASCULAR THROMBECTOMY;  Surgeon: Gretta Lonni PARAS, MD;  [facility];  Service: Cardiovascular;;   REVISION AMPUTATION, BELOW THE KNEE Left 10/23/2023   Procedure: REVISION AMPUTATION, BELOW THE KNEE;  Surgeon: Harden Jerona GAILS, MD;  Location: [facility] OR;  Service: Orthopedics;  Laterality: Left;  REVISION LEFT BELOW KNEE AMPUTATION   RIGHT HEART CATH N/A 11/29/2023   Procedure: RIGHT HEART CATH;  Surgeon: Rolan Ezra RAMAN, MD;  Location: [facility] INVASIVE CV LAB;  Service: Cardiovascular;  Laterality: N/A;   RIGHT/LEFT HEART CATH AND CORONARY ANGIOGRAPHY N/A 08/23/2022   Procedure: RIGHT/LEFT HEART CATH AND CORONARY ANGIOGRAPHY;  Surgeon: Dann Candyce RAMAN, MD;  Location: [facility] INVASIVE CV LAB;  Service: Cardiovascular;  Laterality: N/A;   SKIN SPLIT GRAFT Right 03/18/2020   Procedure: SKIN GRAFTING RIGHT FOOT ULCER;  Surgeon: Harden Jerona GAILS, MD;  Location: [facility] OR;  Service: Orthopedics;  Laterality: Right;   STUMP REVISION Right 11/30/2022   Procedure: REVISION RIGHT TRANSMETATARSAL AMPUTATION;  Surgeon: Harden Jerona GAILS, MD;  Location: [facility] OR;  Service: Orthopedics;  Laterality: Right;   TEE WITHOUT CARDIOVERSION N/A 08/27/2022   Procedure: TRANSESOPHAGEAL ECHOCARDIOGRAM;  Surgeon: Hobart Powell BRAVO, MD;  Location: [facility] INVASIVE CV LAB;  Service: Cardiovascular;  Laterality: N/A;   TRANSESOPHAGEAL ECHOCARDIOGRAM (CATH LAB) N/A 10/24/2023   Procedure: TRANSESOPHAGEAL ECHOCARDIOGRAM;   Surgeon: Michele Richardson, DO;  [facility];  Service: Cardiovascular;  Laterality: N/A;   WISDOM TOOTH EXTRACTION     HPI:  Patient is a 58 y.o. female with PMH: 6/22-7/1 for LLE pain, sepsis, necrotizing fasciitis. 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 CAD, aortic stenosis, HTN, CHF, PAD, bil transmet amputation, HLD, hypothyroidsim, T2DM. She was admitted from CIR on 11/12/23 for SOB, CHF exacerbation. On  7/17 attempted RHC but couldn't lie flat with pulmonary edema and decompensation requiring intubation. 7/19 CRRT. 7/20 extubation. Planned discharge 7/30 held due to leukocytosis, patient having dysuria. 8/2 underwent cystoscopy with clot evacuation, remained intubated. Extubated 8/4 on bipap. She passed [facility] swallow screen with RN on 8/4 but had a lot of coughing after drinking some broth and SLP was ordered to assess swallow function.    Assessment / Plan / Recommendation  Clinical Impression  Patient is not currently presenting with clinical s/s of dysphagia as per this bedside swallow evaluation. She told SLP that following extubation yesterday, her voice was raspy, she was coughing up phlegm and she had a coughing incident after drinking chicken broth. Today she feels much better, she and her mother report that her voice is almost back to baseline. Patient denies any further coughing episodes with PO intake and has not had to cough up any phlegm today. SLP assessed patient's swallow as she drank thin liquids via straw sips as well as when swallowing one of her medications. Swallow initiation was timely, no overt s/s aspiration during or after PO intake  and patient without c/o of difficulty or discomfort. SLP recommending continue on regular solids, thin liquids and no further skilled intervention or assessment warranted at this time. SLP Visit Diagnosis: Dysphagia, unspecified (R13.10)    Aspiration Risk  No limitations    Diet Recommendation Regular;Thin liquid    Liquid Administration via:  Straw;Cup Medication Administration: Whole meds with liquid Supervision: Patient able to self feed Compensations: Slow rate Postural Changes: Seated upright at 90 degrees    Other  Recommendations Oral Care Recommendations: Oral care BID;Patient independent with oral care     Assistance Recommended at Discharge    Functional Status Assessment Patient has not had a recent decline in their functional status  Frequency and Duration   N/A         Prognosis   N/A     Swallow Study   General Date of Onset: 12/02/23 HPI: Patient is a 58 y.o. female with PMH: 6/22-7/1 for LLE pain, sepsis, necrotizing fasciitis. 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 CAD, aortic stenosis, HTN, CHF, PAD, bil transmet amputation, HLD, hypothyroidsim, T2DM. She was admitted from CIR on 11/12/23 for SOB, CHF exacerbation. On  7/17 attempted RHC but couldn't lie flat with pulmonary edema and decompensation requiring intubation. 7/19 CRRT. 7/20 extubation. Planned discharge 7/30 held due to leukocytosis, patient having dysuria. 8/2 underwent cystoscopy with clot evacuation, remained intubated. Extubated 8/4 on bipap. She passed [facility] swallow screen with RN on 8/4 but had a lot of coughing after drinking some broth and SLP was ordered to assess swallow function. Type of Study: Bedside Swallow Evaluation Previous Swallow Assessment: none found Diet Prior to this Study: Regular;Thin liquids (Level 0) Temperature Spikes Noted: No Respiratory Status: Nasal cannula History of Recent Intubation: Yes Total duration of intubation (days): 6 days Date extubated:  (intubated 7/17-7/19 and 8/2-8/4) Behavior/Cognition: Alert;Cooperative;Pleasant mood Oral Cavity Assessment: Within Functional Limits Oral Care Completed by SLP: No Oral Cavity - Dentition: Other (Comment) Vision: Functional for self-feeding Self-Feeding Abilities: Able to feed self Patient Positioning: Upright in bed Baseline Vocal Quality: Normal Volitional Cough: Strong Volitional  Swallow: Able to elicit    Oral/Motor/Sensory Function Overall Oral Motor/Sensory Function: Within functional limits   Ice Chips     Thin Liquid Thin Liquid: Within functional limits Presentation: Straw;Self Fed    Nectar Thick     Honey Thick     Puree Puree: Not tested   Solid     Solid: Not tested      Norleen IVAR Blase, MA, CCC-SLP Speech Therapy

## 2023-12-03 NOTE — Progress Notes (Signed)
   12/03/23 2300  BiPAP/CPAP/SIPAP  BiPAP/CPAP/SIPAP Pt Type Adult  BiPAP/CPAP/SIPAP SERVO  Mask Type Full face mask  Dentures removed? Yes - Placed in denture cup  Mask Size Medium  Set Rate 10 breaths/min  Respiratory Rate 15 breaths/min  IPAP 13 cmH20  EPAP 5 cmH2O  PEEP 8 cmH20  FiO2 (%) 40 %  Minute Ventilation 9.1  Leak 41  Peak Inspiratory Pressure (PIP) 21  Tidal Volume (Vt) 603  Patient Home Machine No  Patient Home Mask No  Patient Home Tubing No  Auto Titrate No  Press High Alarm 25 cmH2O  Press Low Alarm 4 cmH2O  CPAP/SIPAP surface wiped down Yes  Device Plugged into RED Power Outlet Yes  Oxygen  Percent 40 %  BiPAP/CPAP /SiPAP Vitals  Pulse Rate 85  Resp 17  BP 108/65  SpO2 98 %  MEWS Score/Color  MEWS Score 0  MEWS Score Color Green

## 2023-12-03 NOTE — Progress Notes (Addendum)
 Nutrition Follow-up  DOCUMENTATION CODES:   Not applicable  INTERVENTION:  Calorie Count: If inadequate po intake over the next 48 hours, recommend considering Cortrak placement on 8/06  Recommend liberalizing diet to Regular, if intake inadequate   Continue Ensure Plus High Protein po TID, each supplement provides 350 kcal and 20 grams of protein  Hold Juven while on Calorie count, until taking adequate po intake   Continue MVI with minerals   NUTRITION DIAGNOSIS:  Inadequate oral intake related to acute illness as evidenced by NPO status. - remains applicable  GOAL:  Patient will meet greater than or equal to 90% of their needs - progressing  MONITOR:  PO intake, Supplement acceptance, Labs, Weight trends  REASON FOR ASSESSMENT:  Consult, Ventilator Enteral/tube feeding initiation and management  ASSESSMENT:   58 yo female admitted with acute on chronic diastolic HF, cardigenic shock, AKI, moderate MR, moderate to severe aortic stenosis, MRSA bacteremia and acute respiratory failure requiring ventilation. 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  7/15 Admitted from CIR 7/17 Transferred to ICU, placed on BiPap and inotrope support, later intubated 7/18 TF initiated 7/19 CRRT initiated TF at 30 ml/hr but held due to emesis, later restarted at 10 ml/hr 7/20 Extubated 7/21 CL diet, minimal intake with poor mentation 7/22 CRRT stopped  7/25 Transferred out of ICU 7/31 Worsening AKI, worsening pulmonary edema with bilateral pleural effusions 8/01 RHC: elevated pressures w/ preserved cardiac output 8/02 OR with Urology: gross hematuria and clot retention with large volume fibrinous clot filling entire bladder requiring evacuation of clot (500 mL) and fulguration of bleeding, Remained on vent post-op 8/04 Extubated to BiPap 8/05 Diet advanced; calorie count continues  Extubated to BiPap  yesterday. Will d/c today. Now with nasal cannula. Weaning down as able.  Lasix  discontinued today and modified to torsemide . Net negative 8L thus far this admission. CVP down to 3.  Diet advanced per MD, however made NPO again overnight d/t reports of choking on chicken broth. Therefore NPO for day one of calorie count. Diet advanced today. She endorses wanting orange Jell-O and coffee. Calorie count envelope hung now that diet advanced. Discussed need for adequate intake to support wound healing, preservation of lean body mass, and overall improvement. She verbalized understanding. RN reporting she is already taking Ensure well. Of note, prefers chocolate flavor. Suspect calorie count will show adequate intake to support her estimated needs and nutrition support will not be indicated. Will follow up tomorrow.    Intake/Output Summary (Last 24 hours) at 12/03/2023 1159 Last data filed at 12/03/2023 0800 Gross per 24 hour  Intake 179.33 ml  Output 2950 ml  Net -2770.67 ml    Net IO Since Admission: -8,311.04 mL [12/03/23 1159]    Noted pt needs CABG and SAVR evaluation once clinically stable. This would look like >1 month without infection and have normalized renal function. Would also require extensive vascular assessment given her PVD.    Recurrent AKI, now likely post-renal AKI related to bladder outlet obstruction from blood clot. Nephrology following. Crt improving.  UOP 3.8 L in 24 hours   Current wt 85.4 kg, wt 86.1 kg yesterday. Lowest wt this admission 84.4 kg Wt on admission from CIR 92.5 kg (7/15). Does have trace edema to L BKA and R TMA.  Requiring aggressive K+ supplementation while diuresing. WBCs normalized. Has completed IV ABX course.    Labs:  BUN 111>110>94 (H) Creatinine 2.08>1.97>1.68 (H) Sodium 142 (wdl) Potassium 3.2>3.4>3.4 (L) Phosphorus 5.7 (8/04) Magnesium  2.3 (8/04) WBC 13.9>11.5>8.2 (wdl) TSH 16.701 (H)   Meds: Ferrous sulfate  SS novolog  0-15 TID Semglee   6 BID MVI Midodrine  Pantoprazole  Torsemide  10 mEq IV KCl x4  Diet Order:   Diet Order             Diet NPO time specified  Diet effective now             EDUCATION NEEDS:  Not appropriate for education at this time  Skin:  Skin Assessment: Skin Integrity Issues: Skin Integrity Issues:: Other (Comment) Incisions: Recent L BKA post nec fasc (incision clean, minimal drainage, foam dressing in place per RN documentation) Other: Ecchymosis to b/l buttocks  Last BM:  8/04 - type 6 x1  Height:  Ht Readings from Last 1 Encounters:  12/02/23 5' 4 (1.626 m)   Weight:  Wt Readings from Last 1 Encounters:  12/03/23 85.4 kg    BMI:  Body mass index is 32.32 kg/m.  Estimated Nutritional Needs:   Kcal:  1800-2000 kcals  Protein:  90-105 g  Fluid:  1.5L  Blair Deaner MS, RD, LDN Registered Dietitian Clinical Nutrition RD Inpatient Contact Info in Amion

## 2023-12-03 NOTE — Progress Notes (Addendum)
 Patient ID: Kayla Schmitt, female   DOB: March 30, 1966, 58 y.o.   MRN: [id number]     Advanced Heart Failure Rounding Note  Cardiologist: Vishnu SHAUNNA Maywood, MD  AHF Consulting MD: Dr. Cherrie  Chief Complaint: Acute on chronic HFpEF / RV Failure  Patient Profile   58 y.o. female with history of PAD with prior SFA stents and bilateral TMA, severe 3v CAD, MR/AS, DM II, anemia, HTN, HLD, hypothyroidism.  Admitted in 6/25 with MRSA bactermia and necrotizing fasciitis s/p left BKA. Transferred back to [facility] from rehab d/t acute on chronic CHF with concern for low-output and acute respiratory failure.  Subjective:    7/18: aborted cath for acute respiratory distress; intubated; DBA started 7/19: started on CRRT 7/20: extubated 7/21: hgb 6.5 s/p 1u RBCs 7/23: CRRT stopped 8/1: RHC (see below).  8/2: To OR for cystoscopy and declotting of bladder.  Bladder filled with clot, no obvious bladder lesion.  8/4 extubated  NPO d/t coughing episodes. Hungry and requesting a meal.   Denies shortness of breath. Comfortable on 4L [place].  Continues on IV lasix  120 BID with 3.8L UOP last 24 hrs. CVP 3. Scr further improved to 1.7.   Weight Range: 85.4 kg Body mass index is 32.32 kg/m.   Vital Signs:   Temp:  [97.6 F (36.4 C)-98.5 F (36.9 C)] 97.9 F (36.6 C) (08/05 0700) Pulse Rate:  [76-95] 80 (08/05 0800) Resp:  [14-28] 18 (08/05 0800) BP: (93-116)/(48-68) 109/60 (08/05 0800) SpO2:  [94 %-100 %] 98 % (08/05 0800) Arterial Line BP: (78-115)/(49-92) 101/59 (08/05 0800) FiO2 (%):  [40 %] 40 % (08/04 2140) Weight:  [85.4 kg] 85.4 kg (08/05 0400) Last BM Date : 12/02/23  Weight change: Filed Weights   12/01/23 0330 12/02/23 0404 12/03/23 0400  Weight: 88.2 kg 86.1 kg 85.4 kg   Intake/Output:  Intake/Output Summary (Last 24 hours) at 12/03/2023 1105 Last data filed at 12/03/2023 0800 Gross per 24 hour  Intake 179.33 ml  Output 2950 ml  Net -2770.67 ml    Physical Exam    General:  Chronically ill appearing Neck: No JVD Cor: Regular rate & rhythm. 3/6 late peaking crescendo decrescendo systolic murmur with absent S2 Lungs: clear Abdomen: soft, nontender, nondistended Extremities: Trace edema. L BKA, R TMA Neuro: alert & orientedx3. Affect pleasant    Telemetry   SR 80s   Labs    CBC Recent Labs    12/02/23 0353 12/03/23 0502  WBC 11.5* 8.2  HGB 8.7* 8.4*  HCT 28.6* 28.5*  MCV 99.0 100.0  PLT 233 225   Basic Metabolic Panel Recent Labs    91/95/74 0353 12/02/23 1212 12/03/23 0502  NA 142  --  142  K 3.2* 3.4* 3.4*  CL 99  --  100  CO2 26  --  28  GLUCOSE 122*  --  116*  BUN 110*  --  94*  CREATININE 1.97*  --  1.68*  CALCIUM  8.8*  --  9.0  MG 2.3  --   --   PHOS 5.7*  --   --    Liver Function Tests No results for input(s): AST, ALT, ALKPHOS, BILITOT, PROT, ALBUMIN  in the last 72 hours.   BNP (last 3 results) Recent Labs    11/07/23 1457 11/13/23 0521 11/29/23 0230  BNP 336.4* 1,626.5* 2,406.9*   Medications:    Scheduled Medications:  sodium chloride    Intravenous Once   aspirin   81 mg Per Tube Daily   atorvastatin   40 mg Oral Daily   bethanechol   10 mg Oral TID   Chlorhexidine  Gluconate Cloth  6 each Topical Daily   clopidogrel   75 mg Oral Daily   feeding supplement  237 mL Oral TID BM   ferrous sulfate   325 mg Oral Q breakfast   gabapentin   300 mg Oral QHS   Gerhardt's butt cream   Topical BID   heparin  injection (subcutaneous)  5,000 Units Subcutaneous Q8H   hydrocortisone  cream   Topical TID   insulin  aspart  0-15 Units Subcutaneous TID WC   insulin  glargine-yfgn  6 Units Subcutaneous BID   levothyroxine   150 mcg Oral Q0600   midodrine   5 mg Oral Q8H   multivitamin with minerals  1 tablet Oral Daily   pantoprazole   40 mg Oral Daily   potassium chloride   40 mEq Oral BID   sodium chloride  flush  10-40 mL Intracatheter Q12H   torsemide   80 mg Oral BID   Infusions:  potassium chloride   10 mEq (12/03/23 1001)    PRN Medications: acetaminophen , Gerhardt's butt cream, ipratropium-albuterol , lidocaine , mouth rinse, mouth rinse, mouth rinse, oxyCODONE , polyethylene glycol, prochlorperazine , senna-docusate, sodium chloride  flush  Assessment/Plan  Acute on chronic HFpEF>Mixed Shock; Cardiogenic/Septic - TEE 6/25: EF 60-65%, RV okay, mild to mod MR, moderate-severe AS with mG 27 mmHg and AVA 1.07 cm2 - TTE 11/15/23 LVEF 25% - Taken for [facility] 7/17, however developed flash pulmonary edema. Unable to lie flat for procedure.  - Repeat echo with EF 25-30%, G2DD, mild to mod MR, mod-severe AS  - GDMT limited by renal function and hypotension (SBP 80s-90s generally). On midodrine  5 mg tid with stable BP this morning.  - RHC on 8/1 showed severely elevated PCWP, mildly elevated RAP, preserved CO.  - Diuresed well with high-dose IV lasix . CVP down to 3. Stop IV lasix . Restart po Torsemide  at 80 mg BID. Need to avoid overdiuresis with significant AS. - MAP stable off pressors. Remains on midodrine  5 mg TID. Continue for now. - GDMT limited by AKI and low BP  2. Acute respiratory failure with hypoxia - Due to pulmonary edema - Extubated 08/04 - Currently on 4L [place], weaning off   3. CAD - NSTEMI in 4/24. Multivessel CAD. CABG had been recommended but managed medically due to other active medical issues - Further ischemic evaluation pending stabilization of renal function => no cath with AKI. Likely will need to defer to outpatient follow-up. - continue aspirin  + statin   4. Valvular heart disease - Mild to moderate MR and moderate to severe AS on echo 6/25 - Repeat echo 7/25 with extremely poor windows, based on exam expect AS moderate-severe - Echo 11/20/23 with Vmax 3.21, DVT 0.26, mod to severe AS, mG 22 - AS appears severe on exan but she is not a TAVR or SAVR candidate currently with infectious issues. Will need to consider structural evaluation down the line as she recovers   5.  MRSA bacteremia - Necrotizing fasciitis - S/p recent left BKA 6/25; S/p R TMA in 4/24  6. UTI - Completed course of Ceftriaxone   7. PAD - hx SFA stents - On aspirin  + statin. Had been on plavix  but stopped d/t risk of bleeding.   8. Anemia - s/p 1 u RBC 7/14 - Hx GI bleed from AVMs.  - fecal occult negative - No recurrent hematuria today - Hgb stable 8.4 today   9. AKI on CKD IIIa - In setting of volume overload/low-output.  - Started  on CVVH 7/19, stopped 7/22.  - Some renal recovery and creatinine had been trending down, but BUN/creatinine trended back up in setting bladder outlet obstruction from extensive clots in bladder.  Now s/p cystoscopy and clot removal, good UOP and BUN/creatinine trending down  10. Hypokalemia - supp aggressively with diuretics    FINCH, LINDSAY N 12/03/2023 11:05 AM  Patient seen and examined with the above-signed Advanced Practice Provider and/or Housestaff. I personally reviewed laboratory data, imaging studies and relevant notes. I independently examined the patient and formulated the important aspects of the plan. I have edited the note to reflect any of my changes or salient points. I have personally discussed the plan with the patient and/or family.  Continues to diurese well on IV lasix . Breathing much better. O2 requirement improving   Remains on midodrine  for BP support   General:  Sitting up in bed  No resp difficulty HEENT: normal on HFNC Neck: supple.JVP 7-8   Cor: Reg high pitched AS Lungs: clear Abdomen: soft, nontender, nondistended. No hepatosplenomegaly. No bruits or masses. Good bowel sounds.   Extremities: no cyanosis, clubbing, rash, tr edema  + L BKA and R TMA Neuro: alert & orientedx3, cranial nerves grossly intact. moves all 4 extremities w/o difficulty. Affect pleasant  Continue IV lasix  one more day. Continue midodrine . Not candidate for TAVR at this time. Would need to be stable and infection free > 4 week prior to  considering any AoV intervention. Supp K.   Toribio Fuel, MD  7:13 PM

## 2023-12-04 LAB — GLUCOSE, CAPILLARY
Glucose-Capillary: >600 mg/dL (ref 70–99)
Glucose-Capillary: 177 mg/dL — ABNORMAL HIGH (ref 70–99)
Glucose-Capillary: 198 mg/dL — ABNORMAL HIGH (ref 70–99)
Glucose-Capillary: 167 mg/dL — ABNORMAL HIGH (ref 70–99)
Glucose-Capillary: 219 mg/dL — ABNORMAL HIGH (ref 70–99)

## 2023-12-04 LAB — CBC
HCT: 27.9 % — ABNORMAL LOW (ref 36.0–46.0)
Hemoglobin: 8.4 g/dL — ABNORMAL LOW (ref 12.0–15.0)
MCH: 29.9 pg (ref 26.0–34.0)
MCHC: 30.1 g/dL (ref 30.0–36.0)
MCV: 99.3 fL (ref 80.0–100.0)
Platelets: 238 K/uL (ref 150–400)
RBC: 2.81 MIL/uL — ABNORMAL LOW (ref 3.87–5.11)
RDW: 21.4 % — ABNORMAL HIGH (ref 11.5–15.5)
WBC: 9.7 K/uL (ref 4.0–10.5)
nRBC: 0 % (ref 0.0–0.2)

## 2023-12-04 LAB — BASIC METABOLIC PANEL WITH GFR
Anion gap: 12 (ref 5–15)
BUN: 89 mg/dL — ABNORMAL HIGH (ref 6–20)
CO2: 28 mmol/L (ref 22–32)
Calcium: 8.8 mg/dL — ABNORMAL LOW (ref 8.9–10.3)
Chloride: 96 mmol/L — ABNORMAL LOW (ref 98–111)
Creatinine, Ser: 1.5 mg/dL — ABNORMAL HIGH (ref 0.44–1.00)
GFR, Estimated: 41 mL/min — ABNORMAL LOW (ref >60)
Glucose, Bld: 175 mg/dL — ABNORMAL HIGH (ref 70–99)
Potassium: 3.9 mmol/L (ref 3.5–5.1)
Sodium: 136 mmol/L (ref 135–145)

## 2023-12-04 LAB — COOXEMETRY PANEL
Carboxyhemoglobin: 2.5 % — ABNORMAL HIGH (ref 0.5–1.5)
Methemoglobin: <0.7 % (ref 0.0–1.5)
O2 Saturation: 61.9 %
Total hemoglobin: 9.2 g/dL — ABNORMAL LOW (ref 12.0–16.0)

## 2023-12-04 MED ORDER — POTASSIUM CHLORIDE CRYS ER 20 MEQ PO TBCR
20.0000 meq | EXTENDED_RELEASE_TABLET | Freq: Once | ORAL | Status: AC
  Administered 2023-12-04: 20 meq via ORAL
  Filled 2023-12-04: qty 1

## 2023-12-04 MED ORDER — ASPIRIN 81 MG PO CHEW
81.0000 mg | CHEWABLE_TABLET | Freq: Every day | ORAL | Status: DC
  Filled 2023-12-04: qty 1

## 2023-12-04 NOTE — Progress Notes (Signed)
 Patient ID: Kayla Schmitt, female   DOB: February 26, 1966, 58 y.o.   MRN: [id number]  [facility] Progress Note   Assessment/ Plan:   1. Acute kidney Injury on chronic kidney disease stage III (baseline creatinine appears 1.3-1.6): resolving Recent AKI likely obstructive from large bladder hematoma req declot / cysto Improving, UOP excellent Cont supportive care, no RRT indicated  2.  Acute exacerbation of congestive heart failure/cardiogenic shock: History of congestive heart failure with reduced ejection fraction and decompensation noted during this hospitalization.  Good UOP, AHF following 3.  Hyponatremia: resolved 4.  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  Per CCM 5.  Anemia: Hb stable 6. UTI: ABX per CCM  Will sign off for now.  Please call with any questions or concerns.  Pt does not need follow up with nephrology as will be closely followed by PCP and cardiology.     Subjective:   2.2L UOP, no further hematuria SCr down to 1.5 this AM AHF managing diuretics    Objective:   BP 104/63   Pulse 83   Temp 98.2 F (36.8 C) (Oral)   Resp 15   Ht 5' 4 (1.626 m)   Wt 84.3 kg   SpO2 94%   BMI 31.90 kg/m   Intake/Output Summary (Last 24 hours) at 12/04/2023 0855 Last data filed at 12/04/2023 0600 Gross per 24 hour  Intake 840.05 ml  Output 2225 ml  Net -1384.95 ml   Weight change: -1.1 kg  Physical Exam: Gen: NAD CVS: Pulse regular rhythm, normal rate S1 and S2 normal Resp: CTAB Abd: Soft, obese, nontender, bowel sounds normal Ext: Status post left BKA and right forefoot amputation.  Trace edema over dependent areas.. GU: Urine clear yellow  Imaging: DG Chest Port 1 View Result Date: 12/03/2023 CLINICAL DATA:  Pulmonary edema EXAM: PORTABLE CHEST 1 VIEW COMPARISON:  Chest radiograph dated 11/30/2023 FINDINGS: Lines/tubes: Interval extubation and removal of enteric tube. Left  internal jugular venous catheter tip projects over the confluence of brachiocephalic veins and SVC. Lungs: Low lung volumes. Similar diffuse interstitial and bibasilar patchy opacities. Pleura: Similar small to moderate bilateral pleural effusions. No pneumothorax. Heart/mediastinum: Similar enlarged cardiomediastinal silhouette. Bones: No acute osseous abnormality. IMPRESSION: 1. Interval extubation and removal of enteric tube. 2. Similar diffuse interstitial and bibasilar patchy opacities, likely pulmonary edema. 3. Similar small to moderate bilateral pleural effusions. Electronically Signed   By: Limin  Xu M.D.   On: 12/03/2023 11:27     Labs: BMET Recent Labs  Lab 11/30/23 0739 11/30/23 1104 12/01/23 0408 12/01/23 0410 12/01/23 1500 12/02/23 0353 12/02/23 1212 12/03/23 0502 12/03/23 1425 12/04/23 0420  NA 137   < > 138 138 140 142  --  142 139 136  K 4.7   < > 4.1 4.1 4.0 3.2* 3.4* 3.4* 4.1 3.9  CL 96*  --   --  101 102 99  --  100 96* 96*  CO2 21*  --   --  22 23 26   --  28 28 28   GLUCOSE 198*  --   --  179* 103* 122*  --  116* 207* 175*  BUN 120*  --   --  111* 111* 110*  --  94* 88* 89*  CREATININE 2.50*  --   --  2.12* 2.08* 1.97*  --  1.68* 1.64* 1.50*  CALCIUM  9.6  --   --  8.7* 8.8* 8.8*  --  9.0 8.7*  8.8*  PHOS  --   --   --   --   --  5.7*  --   --   --   --    < > = values in this interval not displayed.   CBC Recent Labs  Lab 11/28/23 1501 11/29/23 0230 11/29/23 1056 12/01/23 0410 12/02/23 0353 12/03/23 0502 12/04/23 0420  WBC 29.2* 21.0*   < > 13.9* 11.5* 8.2 9.7  NEUTROABS 23.7* 14.4*  --   --   --   --   --   HGB 7.9* 7.5*   < > 8.7* 8.7* 8.4* 8.4*  HCT 25.1* 23.8*   < > 28.2* 28.6* 28.5* 27.9*  MCV 90.6 92.6   < > 96.9 99.0 100.0 99.3  PLT 361 271   < > 248 233 225 238   < > = values in this interval not displayed.    Medications:     sodium chloride    Intravenous Once   aspirin   81 mg Per Tube Daily   atorvastatin   40 mg Oral Daily    bethanechol   10 mg Oral TID   Chlorhexidine  Gluconate Cloth  6 each Topical Daily   clopidogrel   75 mg Oral Daily   feeding supplement  237 mL Oral TID BM   ferrous sulfate   325 mg Oral Q breakfast   gabapentin   300 mg Oral QHS   Gerhardt's butt cream   Topical BID   heparin  injection (subcutaneous)  5,000 Units Subcutaneous Q8H   hydrocortisone  cream   Topical TID   insulin  aspart  0-15 Units Subcutaneous TID WC   insulin  glargine-yfgn  6 Units Subcutaneous BID   levothyroxine   150 mcg Oral Q0600   midodrine   5 mg Oral Q8H   multivitamin with minerals  1 tablet Oral Daily   pantoprazole   40 mg Oral Daily   potassium chloride   20 mEq Oral Once   sodium chloride  flush  10-40 mL Intracatheter Q12H   torsemide   80 mg Oral BID

## 2023-12-04 NOTE — Progress Notes (Signed)
 Progress Note   Patient: Kayla Schmitt FMW:[id number] DOB: 08/18/1965 DOA: 11/12/2023     22 DOS: the patient was seen and examined on 12/04/2023   Brief hospital course: Mrs. Mcvicar was admitted to the hospital with the working diagnosis of heart failure exacerbation complicated with renal failure.   58 yo female with the past medical history of aortic stenosis, coronary artery disease, mitral regurgitation, heart failure, peripheral vascular disease and hypertension who presented with dyspnea.  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 She was discharged to CIR. On 07/15 she was found volume overloaded, worsening peripheral edema, positive dyspnea and 02 saturation in the 80's. Her blood pressure was 117/72, HR 123, RR 25 and 02 saturation 94% on supplemental 02 per [place].  Lungs with bilateral rales, increased work of breathing, heart with S1 and S2 present and regular, positive systolic murmur at the base, abdomen with no distention and positive right lower extremity edema.    Na 135, K 4.4 Cl 100 bicarbonate 23 glucose 181, BUN 24 cr 1.28  High sensitive troponin 489, 554, 671  Wbc 11.4 hgb 9,1 plt 496   EKG 105 bpm, normal axis, normal intervals qtc 452, sinus rhythm with no significant ST segment or T wave changes.   Chest radiograph with cardiomegaly with diffuse bilateral interstitial infiltrates.   7/16 admission 7/17 ICU, intubated 7/18 started spiking fever with Tmax 101.5, tachycardic.  X-ray chest still showing bilateral infiltrate but FiO2 is coming down.  Lasix  infusion increased to 30 mg/h 7/19 started on CRRT, tolerating spontaneous breathing trial 7/20 extubated 7/22 CRRT holiday, Levophed /dobutamine  7/23 lasix  challenge, transfused 1u PRBC, remains low dose NE, DBA 2.5, CVP 7 7/24 laisx, off DBA  07/26 transferred to [facility].  07/27 renal function improving.  07/28 improved volume status,  noted high BUN.  07/29 pending improvement in BUN  07/30 BUN has stabilized.  7/31: PCCM was called for eval. PCXR: worse pulm edema and bilateral pleural effusions. Negative 1.6 L fluid balance in the last 3 days Placed on non invasive mechanical ventilation, Bipap.  8/2 started with hematuria, Foley is leaking, urology unable to irrigate the bladder, went to the OR, underwent cystoscopy which showed large clot in urinary bladder no lesion no bladder perf. Clot was evacuated, placed on CBI. Etiology of clot not clear. Urology hypothesizing UTI  Remained intubated post urologic procedure.  8/3 remained on vent. CBI continued  8/4 passed SBT extubated to BIPAP. CVP ~10. Clear liq diet started. Coughing intermittently SLP eval requested  08/06 transferred to [facility]  Assessment and Plan: * Acute on chronic systolic CHF (congestive heart failure) (HCC) 07/18 echocardiogram with reduced LV systolic function to 25%, mild to moderate MR, mild AS.  07/23 limited echocardiogram with reduced LV systolic function with EF 25 to 30%, global hypokinesis, mild to moderate MR, moderate to severe aortic stenosis,   Patient required vasopressors and inotropic support with norepinephrine  and dobutamine , off 07/24.  Cardiogenic shock has resolved.   08/01 cardiac catheterization  RA 8  RV 58/15  PA 60/25 mean 40  PCWP mean 29 with prominent v waves to 43  Cardiac output 5.28 and index 2,77 Fick)  PVR 2,1 WU   Patient was placed on midodrine  for blood pressure support and loop diuretic for volume control.     Systolic blood pressure is 100 mmHg range.   Holding on afterload reduction due to risk of hypotension.   Acute hypoxemia  and hypercapnic respiratory failure due to acute cardiogenic pulmonary edema and aspiration pneumonia.  Liberated from invasive mechanical ventilation on 07/20.  Completed 7 days of antibiotic therapy. 02 saturation 96% on  2L min per [place], continue supplemental 02 at home, since  room air 02 was 87%   Required second intubation post urologic procedure.  Liberated from mechanical ventilation 08/04  02 saturation 95% on 1 L/min per [place]   Patient is not a TAVR or SAVR candidate.   CAD (coronary artery disease) No chest pain, no acute coronary syndrome.  Continue aspirin  and clopidogrel .  Statin   Hypertension Holding antihypertensive medications due to risk of hypotension.   Chronic kidney disease, stage 3a (HCC) AKI, hyponatremia, hypokalemia, hypomagnesemia urinary retention.   Patient required renal replacement therapy 07/22 discontinued CRRT.  07/24 urinary retention (650 ml) placed foley.  07/26 Foley removed last night, check bladder scan.  07/27 tolerating well off foley.   Patient developed hematuria and required replacement of foley catheter.  08/02 cystoscopy, evacuation of clot from bladder and fulguration of bleeding.  Continuous bladder irrigation  Urinary tract infection required IV ceftriaxone  (not related to urinary cathter/ not present on admission)   Today renal function with serum cr at 1,50 with K at 3,9 and serum bicarbonate at 28  Na 136   Type 2 diabetes mellitus with hyperlipidemia (HCC) Hyperglycemia  Patient was placed on insulin  sliding scale for glucose cover and monitoring.   Basal insulin   Fasting glucose today 175 mg /dl   Continue statin therapy.  S/P BKA (below knee amputation) unilateral, left (HCC) Right foot skin ulceration.   Recent MRSA bacteremia, completed daptomycin  and 7 days of linezolid  with Zosyn , on 07/24   Peripheral vascular disease.  08/02/2022 she underwent left SFA and above-knee popliteal percutaneous thrombectomy with JETI and additional angioplasty and stenting into the above-knee popliteal artery for an occluded distal SFA stent with tissue loss. She then on 08/06/2022 underwent laser arthrectomy of the right SFA above-knee popliteal with DCB for in-stent stenosis   Hypothyroidism Continue  levothyroxine    GERD (gastroesophageal reflux disease) Continue with ppi  Iron  deficiency anemia 07/23 transfused one unit PRBC.  Follow up hgb is 7.4  B 12 658 Follow up cell count as outpatient.          Subjective: Patient is feeling better, dyspnea has improved, no lower extremity edema, foley cathter in place with no pelvic pain and no hematuria.   Physical Exam: Vitals:   12/04/23 0500 12/04/23 0600 12/04/23 0630 12/04/23 0700  BP: 107/76 (!) 105/56 100/64 104/63  Pulse: 86 85 81 83  Resp: 20 19 13 15   Temp:    98.2 F (36.8 C)  TempSrc:    Oral  SpO2: 94% 100% 95% 94%  Weight:  84.3 kg    Height:       Neurology awake and alert., deconditioned ENT with mild pallor with no icterus Cardiovascular with S1 and S2 present and regular with no gallops, rubs or murmurs No JVD Respiratory with rales bilaterally at bases with no wheezing or rhonchi  Abdomen with no distention  No lower extremity edema on the right Left BKA   Data Reviewed:   Family Communication: I spoke with patient's mother at the bedside, we talked in detail about patient's condition, plan of care and prognosis and all questions were addressed.   Disposition: Status is: Inpatient Remains inpatient appropriate because: recovering shock  Planned Discharge Destination: Home     Author: Aditi Rovira  Toribio Furnace, MD 12/04/2023 7:35 AM  For on call review [email or web address].

## 2023-12-04 NOTE — Progress Notes (Signed)
 Calorie Count Note  48 hour calorie count ordered.  Diet: Heart Health/Carb Modified Supplements: Ensure Plus High Protein -TID  8/05 Lunch: 206 calories, 11 gm protein 8/05 Dinner: 285 calories, 20 gm protein 8/06 Breakfast: 180 calories, 5 gm protein Supplements: 2 Ensure (700 calories, 40 gm protein)  Total intake: 1371 kcal (76% of minimum estimated needs)  76 protein (84% of minimum estimated needs)  Estimated Nutritional Needs:  Kcal: 1800-2000  Protein: 90-105 grams Fluid: 1.5L  Nutrition Diagnosis: Inadequate oral intake related to acute illness as evidenced by NPO status   Goal: Patient will meet greater than or equal to 90% of their needs   Intervention:  Ensure Plus High Protein    Nestora Glatter RD, LDN Clinical Dietitian

## 2023-12-04 NOTE — Progress Notes (Addendum)
 Patient ID: Kayla Schmitt, female   DOB: [birth year], 58 y.o.   MRN: [id number]     Advanced Heart Failure Rounding Note  Cardiologist: Vishnu SHAUNNA Maywood, MD  AH[facility] Consulting MD: Dr. Cherrie  Chief Complaint: Acute on chronic HFpEF / RV Failure  Patient Profile   58 y.o. female with history of PAD with prior SFA stents and bilateral TMA, severe 3v CAD, MR/AS, DM II, anemia, HTN, HLD, hypothyroidism.  Admitted in 6/25 with MRSA bactermia and necrotizing fasciitis s/p left BKA. Transferred back to [facility] from rehab d/t acute on chronic CHF with concern for low-output and acute respiratory failure.  Subjective:    7/18: aborted cath for acute respiratory distress; intubated; DBA started 7/19: started on CRRT 7/20: extubated 7/21: hgb 6.5 s/p 1u RBCs 7/23: CRRT stopped 8/1: RHC (see below).  8/2: To OR for cystoscopy and declotting of bladder.  Bladder filled with clot, no obvious bladder lesion.  8/4 extubated  CVP 5. On po Torsemide  80 mg BID. >2L UOP last 24 hrs. Weight trending down.  Feeling well. Sitting up on side of bed. Has transfer orders for floor.  Objective  Weight Range: 84.3 kg Body mass index is 31.9 kg/m.   Vital Signs:   Temp:  [98 F (36.7 C)-98.4 F (36.9 C)] 98.2 F (36.8 C) (08/06 0700) Pulse Rate:  [80-93] 84 (08/06 1000) Resp:  [12-30] 23 (08/06 1000) BP: (89-112)/(51-81) 97/63 (08/06 1000) SpO2:  [92 %-100 %] 100 % (08/06 1000) FiO2 (%):  [40 %] 40 % (08/05 2300) Weight:  [84.3 kg] 84.3 kg (08/06 0600) Last BM Date : 12/04/23  Weight change: Filed Weights   12/02/23 0404 12/03/23 0400 12/04/23 0600  Weight: 86.1 kg 85.4 kg 84.3 kg   Intake/Output:  Intake/Output Summary (Last 24 hours) at 12/04/2023 1111 Last data filed at 12/04/2023 0900 Gross per 24 hour  Intake 596.63 ml  Output 2225 ml  Net -1628.37 ml    Physical Exam   General:  Chronically ill appearing Neck: No JVD Cor: Regular rate & rhythm. 3/6 late peaking crescendo  decrescendo systolic murmur with absent S2 Lungs: clear Abdomen: soft, nontender, nondistended Extremities: Trace edema. L BKA, R TMA Neuro: alert & orientedx3. Affect pleasant    Telemetry   SR 80s   Labs    CBC Recent Labs    12/03/23 0502 12/04/23 0420  WBC 8.2 9.7  HGB 8.4* 8.4*  HCT 28.5* 27.9*  MCV 100.0 99.3  PLT 225 238   Basic Metabolic Panel Recent Labs    91/95/74 0353 12/02/23 1212 12/03/23 1425 12/04/23 0420  NA 142   < > 139 136  K 3.2*   < > 4.1 3.9  CL 99   < > 96* 96*  CO2 26   < > 28 28  GLUCOSE 122*   < > 207* 175*  BUN 110*   < > 88* 89*  CREATININE 1.97*   < > 1.64* 1.50*  CALCIUM  8.8*   < > 8.7* 8.8*  MG 2.3  --   --   --   PHOS 5.7*  --   --   --    < > = values in this interval not displayed.   Liver Function Tests No results for input(s): AST, ALT, ALKPHOS, BILITOT, PROT, ALBUMIN  in the last 72 hours.   BNP (last 3 results) Recent Labs    11/07/23 1457 11/13/23 0521 11/29/23 0230  BNP 336.4* 1,626.5* 2,406.9*   Medications:  Scheduled Medications:  sodium chloride    Intravenous Once   aspirin   81 mg Oral Daily   atorvastatin   40 mg Oral Daily   bethanechol   10 mg Oral TID   Chlorhexidine  Gluconate Cloth  6 each Topical Daily   clopidogrel   75 mg Oral Daily   feeding supplement  237 mL Oral TID BM   ferrous sulfate   325 mg Oral Q breakfast   gabapentin   300 mg Oral QHS   Gerhardt's butt cream   Topical BID   heparin  injection (subcutaneous)  5,000 Units Subcutaneous Q8H   hydrocortisone  cream   Topical TID   insulin  aspart  0-15 Units Subcutaneous TID WC   insulin  glargine-yfgn  6 Units Subcutaneous BID   levothyroxine   150 mcg Oral Q0600   midodrine   5 mg Oral Q8H   multivitamin with minerals  1 tablet Oral Daily   pantoprazole   40 mg Oral Daily   sodium chloride  flush  10-40 mL Intracatheter Q12H   torsemide   80 mg Oral BID   Infusions:    PRN Medications: acetaminophen , Gerhardt's butt  cream, ipratropium-albuterol , lidocaine , mouth rinse, mouth rinse, mouth rinse, oxyCODONE , polyethylene glycol, prochlorperazine , senna-docusate, sodium chloride  flush  Assessment/Plan  Acute on chronic HFpEF>Mixed Shock; Cardiogenic/Septic - TEE 6/25: EF 60-65%, RV okay, mild to mod MR, moderate-severe AS with mG 27 mmHg and AVA 1.07 cm2 - TTE 11/15/23 LVEF 25% - Taken for [facility] 7/17, however developed flash pulmonary edema. Unable to lie flat for procedure.  - Repeat echo with EF 25-30%, G2DD, mild to mod MR, mod-severe AS  - GDMT limited by renal function and hypotension (SBP 80s-90s generally). On midodrine  5 mg tid with stable BP this morning.  - RHC on 8/1 showed severely elevated PCWP, mildly elevated RAP, preserved CO.  - Diuresed CVP 5. Continue po Torsemide  80 BID.  - Continue midodrine  5 TID. BP too soft to try to wean off today. - GDMT limited by AKI and low BP  2. Acute respiratory failure with hypoxia - Due to pulmonary edema - Extubated 08/04 - Weaning off supplemental O2  3. CAD - NSTEMI in 4/24. Multivessel CAD. CABG had been recommended but managed medically due to other active medical issues - Further ischemic evaluation pending stabilization of renal function => no cath with AKI. Likely will need to defer to outpatient follow-up. - continue aspirin  + statin   4. Valvular heart disease - Mild to moderate MR and moderate to severe AS on echo 6/25 - Repeat echo 7/25 with extremely poor windows, based on exam expect AS moderate-severe - Echo 11/20/23 with Vmax 3.21, DVT 0.26, mod to severe AS, mG 22 - AS appears severe on exan but she is not a TAVR or SAVR candidate currently with infectious issues. Would need to be infection free X 4 weeks prior to considering AoV intervention.    5. MRSA bacteremia - Necrotizing fasciitis - S/p recent left BKA 6/25; S/p R TMA in 4/24  6. UTI - Completed course of Ceftriaxone   7. PAD - hx SFA stents - On aspirin  + statin. Had been  on plavix  but stopped d/t risk of bleeding.   8. Anemia - s/p 1 u RBC 7/14 - Hx GI bleed from AVMs.  - fecal occult negative - No recurrent hematuria today - Hgb stable 8.4 today   9. AKI on CKD IIIa - In setting of volume overload/low-output.  - Started on CVVH 7/19, stopped 7/22.  - Some renal recovery and creatinine had  been trending down, but worsened in setting bladder outlet obstruction from extensive clots in bladder.  Now s/p cystoscopy and clot removal, good UOP and renal function recovering   Schmitt, Kayla N 12/04/2023 11:11 AM  Patient seen and examined with the above-signed Advanced Practice Provider and/or Housestaff. I personally reviewed laboratory data, imaging studies and relevant notes. I independently examined the patient and formulated the important aspects of the plan. I have edited the note to reflect any of my changes or salient points. I have personally discussed the plan with the patient and/or family.  Has diuresed well. Breathing much better. CVP 5-6 BP stable.  Remains on po midodrine  for BP support  General:  Sitting up in bed. No resp difficulty HEENT: normal Neck: supple. LIJ TLC  Cor: PMI nondisplaced. Regular rate & rhythm. 3/6 AS Lungs: mild basilar craclkles Abdomen: soft, nontender, nondistended. No hepatosplenomegaly. No bruits or masses. Good bowel sounds.  Extremities: no cyanosis, clubbing, rash, edema  L BKA R TMA Neuro: alert & orientedx3, cranial nerves grossly intact. moves all 4 extremities w/o difficulty. Affect pleasant  Volume status optimized. Hgb stable. Continue current dose of torsemide  and midodrine .   Ideally will get TAVR in near future if she has access.   Can go to floor.   Toribio Fuel, MD  5:14 PM

## 2023-12-05 LAB — BASIC METABOLIC PANEL WITH GFR
Anion gap: 12 (ref 5–15)
BUN: 86 mg/dL — ABNORMAL HIGH (ref 6–20)
CO2: 30 mmol/L (ref 22–32)
Calcium: 8.8 mg/dL — ABNORMAL LOW (ref 8.9–10.3)
Chloride: 93 mmol/L — ABNORMAL LOW (ref 98–111)
Creatinine, Ser: 1.6 mg/dL — ABNORMAL HIGH (ref 0.44–1.00)
GFR, Estimated: 38 mL/min — ABNORMAL LOW (ref >60)
Glucose, Bld: 233 mg/dL — ABNORMAL HIGH (ref 70–99)
Potassium: 3.7 mmol/L (ref 3.5–5.1)
Sodium: 135 mmol/L (ref 135–145)

## 2023-12-05 LAB — CBC
HCT: 27 % — ABNORMAL LOW (ref 36.0–46.0)
Hemoglobin: 8.3 g/dL — ABNORMAL LOW (ref 12.0–15.0)
MCH: 29.9 pg (ref 26.0–34.0)
MCHC: 30.7 g/dL (ref 30.0–36.0)
MCV: 97.1 fL (ref 80.0–100.0)
Platelets: 262 K/uL (ref 150–400)
RBC: 2.78 MIL/uL — ABNORMAL LOW (ref 3.87–5.11)
RDW: 20.8 % — ABNORMAL HIGH (ref 11.5–15.5)
WBC: 8.7 K/uL (ref 4.0–10.5)
nRBC: 0 % (ref 0.0–0.2)

## 2023-12-05 LAB — GLUCOSE, CAPILLARY
Glucose-Capillary: 239 mg/dL — ABNORMAL HIGH (ref 70–99)
Glucose-Capillary: 241 mg/dL — ABNORMAL HIGH (ref 70–99)
Glucose-Capillary: 204 mg/dL — ABNORMAL HIGH (ref 70–99)
Glucose-Capillary: 214 mg/dL — ABNORMAL HIGH (ref 70–99)

## 2023-12-05 MED ORDER — INSULIN GLARGINE-YFGN 100 UNIT/ML ~~LOC~~ SOLN
8.0000 [IU] | Freq: Two times a day (BID) | SUBCUTANEOUS | Status: DC
  Administered 2023-12-05 – 2023-12-06 (×2): 8 [IU] via SUBCUTANEOUS
  Filled 2023-12-05 (×3): qty 0.08

## 2023-12-05 MED ORDER — POTASSIUM CHLORIDE CRYS ER 20 MEQ PO TBCR
40.0000 meq | EXTENDED_RELEASE_TABLET | Freq: Once | ORAL | Status: AC
  Administered 2023-12-05: 40 meq via ORAL
  Filled 2023-12-05: qty 2

## 2023-12-05 MED ORDER — ASPIRIN 81 MG PO TBEC
81.0000 mg | DELAYED_RELEASE_TABLET | Freq: Every day | ORAL | Status: DC
  Administered 2023-12-05 – 2023-12-06 (×2): 81 mg via ORAL
  Filled 2023-12-05 (×2): qty 1

## 2023-12-05 MED ORDER — INSULIN ASPART 100 UNIT/ML IJ SOLN
2.0000 [IU] | Freq: Three times a day (TID) | INTRAMUSCULAR | Status: DC
  Administered 2023-12-05 – 2023-12-06 (×3): 2 [IU] via SUBCUTANEOUS

## 2023-12-05 NOTE — Plan of Care (Signed)
  Problem: Coping: Goal: Level of anxiety will decrease Outcome: Progressing   Problem: Pain Managment: Goal: General experience of comfort will improve and/or be controlled Outcome: Progressing   Problem: Safety: Goal: Ability to remain free from injury will improve Outcome: Progressing   Problem: Skin Integrity: Goal: Risk for impaired skin integrity will decrease Outcome: Progressing

## 2023-12-05 NOTE — Progress Notes (Addendum)
 Patient ID: Kayla Schmitt, female   DOB: [birth year], 58 y.o.   MRN: [id number]     Advanced Heart Failure Rounding Note  Cardiologist: Vishnu SHAUNNA Maywood, MD  AHF Consulting MD: Dr. Cherrie  Chief Complaint: Acute on chronic HFpEF / RV Failure  Patient Profile   58 y.o. female with history of PAD with prior SFA stents and bilateral TMA, severe 3v CAD, MR/AS, DM II, anemia, HTN, HLD, hypothyroidism.  Admitted in 6/25 with MRSA bactermia and necrotizing fasciitis s/p left BKA. Transferred back to [facility] from rehab d/t acute on chronic CHF with concern for low-output and acute respiratory failure.  Subjective:    7/18: aborted cath for acute respiratory distress; intubated; DBA started 7/19: started on CRRT 7/20: extubated 7/21: hgb 6.5 s/p 1u RBCs 7/23: CRRT stopped 8/1: RHC (see below).  8/2: To OR for cystoscopy and declotting of bladder.  Bladder filled with clot, no obvious bladder lesion.  8/4 extubated 8/6: transferred out of ICU   No complaints. Sitting up on side of bed. Finished breakfast. Appetite ok. No resting dyspnea. Sleeping well. Having BMs.   Objective  1.3L in UOP yesterday w/ PO torsemide . SCr 1.5>>1.6  CVP 6-7 K 3.7  SBPs low 100s on midodrine    Weight Range: 84 kg Body mass index is 31.79 kg/m.   Vital Signs:   Temp:  [97.5 F (36.4 C)-98.7 F (37.1 C)] 97.7 F (36.5 C) (08/07 0420) Pulse Rate:  [75-88] 84 (08/07 0420) Resp:  [3-26] 19 (08/07 0420) BP: (89-115)/(53-74) 100/61 (08/07 0420) SpO2:  [90 %-100 %] 95 % (08/07 0420) Weight:  [84 kg] 84 kg (08/07 0420) Last BM Date : 12/04/23  Weight change: Filed Weights   12/03/23 0400 12/04/23 0600 12/05/23 0420  Weight: 85.4 kg 84.3 kg 84 kg   Intake/Output:  Intake/Output Summary (Last 24 hours) at 12/05/2023 0731 Last data filed at 12/05/2023 0138 Gross per 24 hour  Intake 330 ml  Output 1350 ml  Net -1020 ml    Physical Exam    Vitals:   12/05/23 0100 12/05/23 0420  BP:   100/61  Pulse: 75 84  Resp:  19  Temp:  97.7 F (36.5 C)  SpO2: 99% 95%   GENERAL: NAD Lungs- clear CARDIAC:  JVP: 6  cm          Normal rate with regular rhythm. 2/6 AS murmur.   ABDOMEN: Soft, non-tender, non-distended.  EXTREMITIES: trace RLE, s/p R TMA, s/p L BKA  NEUROLOGIC: No obvious FND GU: + foley    Telemetry   NSR 80s, personally reviewed   Labs    CBC Recent Labs    12/04/23 0420 12/05/23 0335  WBC 9.7 8.7  HGB 8.4* 8.3*  HCT 27.9* 27.0*  MCV 99.3 97.1  PLT 238 262   Basic Metabolic Panel Recent Labs    91/93/74 0420 12/05/23 0335  NA 136 135  K 3.9 3.7  CL 96* 93*  CO2 28 30  GLUCOSE 175* 233*  BUN 89* 86*  CREATININE 1.50* 1.60*  CALCIUM  8.8* 8.8*   Liver Function Tests No results for input(s): AST, ALT, ALKPHOS, BILITOT, PROT, ALBUMIN  in the last 72 hours.   BNP (last 3 results) Recent Labs    11/07/23 1457 11/13/23 0521 11/29/23 0230  BNP 336.4* 1,626.5* 2,406.9*   Medications:    Scheduled Medications:  sodium chloride    Intravenous Once   aspirin   81 mg Oral Daily   atorvastatin   40 mg Oral Daily  bethanechol   10 mg Oral TID   Chlorhexidine  Gluconate Cloth  6 each Topical Daily   clopidogrel   75 mg Oral Daily   feeding supplement  237 mL Oral TID BM   ferrous sulfate   325 mg Oral Q breakfast   gabapentin   300 mg Oral QHS   Gerhardt's butt cream   Topical BID   heparin  injection (subcutaneous)  5,000 Units Subcutaneous Q8H   hydrocortisone  cream   Topical TID   insulin  aspart  0-15 Units Subcutaneous TID WC   insulin  glargine-yfgn  6 Units Subcutaneous BID   levothyroxine   150 mcg Oral Q0600   midodrine   5 mg Oral Q8H   multivitamin with minerals  1 tablet Oral Daily   pantoprazole   40 mg Oral Daily   sodium chloride  flush  10-40 mL Intracatheter Q12H   torsemide   80 mg Oral BID   Infusions:    PRN Medications: acetaminophen , Gerhardt's butt cream, ipratropium-albuterol , lidocaine , mouth rinse,  mouth rinse, mouth rinse, oxyCODONE , polyethylene glycol, prochlorperazine , senna-docusate, sodium chloride  flush  Assessment/Plan  Acute on chronic HFpEF>Mixed Shock; Cardiogenic/Septic - TEE 6/25: EF 60-65%, RV okay, mild to mod MR, moderate-severe AS with mG 27 mmHg and AVA 1.07 cm2 - TTE 11/15/23 LVEF 25% - Taken for [facility] 7/17, however developed flash pulmonary edema. Unable to lie flat for procedure.  - Repeat echo with EF 25-30%, G2DD, mild to mod MR, mod-severe AS  - GDMT limited by renal function and hypotension (SBP 80s-90s generally). On midodrine  5 mg tid with stable BP - RHC on 8/1 showed severely elevated PCWP, mildly elevated RAP, preserved CO.  - Well diuresed, CVP 6. Continue po Torsemide  80 BID.  - Continue midodrine  5 TID for BP support  - GDMT limited by AKI and low BP  2. Acute respiratory failure with hypoxia - Due to pulmonary edema - Extubated 08/04 - Weaning off supplemental O2  3. CAD - NSTEMI in 4/24. Multivessel CAD. CABG had been recommended but managed medically due to other active medical issues - Further ischemic evaluation pending stabilization of renal function => no cath with AKI. Likely will need to defer to outpatient follow-up. - continue aspirin  + statin   4. Valvular heart disease - Mild to moderate MR and moderate to severe AS on echo 6/25 - Repeat echo 7/25 with extremely poor windows, based on exam expect AS moderate-severe - Echo 11/20/23 with Vmax 3.21, DVT 0.26, mod to severe AS, mG 22 - AS appears severe on exan but she is not a TAVR or SAVR candidate currently with infectious issues. Would need to be infection free X 4 weeks prior to considering AoV intervention.    5. MRSA bacteremia - Necrotizing fasciitis - S/p recent left BKA 6/25; S/p R TMA in 4/24  6. UTI - Completed course of Ceftriaxone   7. PAD - hx SFA stents - On aspirin  + statin. Had been on plavix  but stopped d/t risk of bleeding.   8. Anemia - s/p 1 u RBC 7/14 - Hx GI  bleed from AVMs.  - fecal occult negative - No recurrent hematuria - Hgb stable 8.3 today   9. AKI on CKD IIIa - In setting of volume overload/low-output.  - Started on CVVH 7/19, stopped 7/22.  - Some renal recovery and creatinine had been trending down, but worsened in setting bladder outlet obstruction from extensive clots in bladder.  Now s/p cystoscopy and clot removal, good UOP and renal function recovering  She is hoping she can go  home soon. She does not want to go back to CIR. She wants home PT.    Kayla Shed, PA-C  12/05/2023 7:31 AM  Patient seen with PA, I formulated the plan and agree with the above note.   CVP 9 on my read today, creatinine stable 1.6 on torsemide  80 mg bid with good UOP.  Hgb is remaining stable, no hematuria.   She feels good, wants to go home.   General: NAD Neck: JVP 8-9 cm, no thyromegaly or thyroid nodule.  Lungs: Clear to auscultation bilaterally with normal respiratory effort. CV: Nondisplaced PMI.  Heart regular S1/S2, no S3/S4, 3/6 SEM RUSB.  Trace right ankle edema.   Abdomen: Soft, nontender, no hepatosplenomegaly, no distention.  Skin: Intact without lesions or rashes.  Neurologic: Alert and oriented x 3.  Psych: Normal affect. Extremities: No clubbing or cyanosis. Right TMA/left BKA HEENT: Normal.   Volume looks stable and renal function is remaining stable.  I will accept CVP 10 or less, continue torsemide  80 mg bid. GDMT limited by low BP and renal dysfunction, she will continue midodrine .   She ultimately needs TAVR, will need followup in structural heart clinic.  Will need her to be out a few weeks from infectious event.   Hgb is remaining stable.    From a cardiac standpoint, she could go home.  Will need close followup in CHF clinic.   Kayla Schmitt 12/05/2023 10:19 AM

## 2023-12-05 NOTE — Progress Notes (Signed)
 Progress Note   Patient: Kayla Schmitt FMW:[id number] DOB: 1966-02-09 DOA: 11/12/2023     23 DOS: the patient was seen and examined on 12/05/2023   Brief hospital course: Kayla Schmitt was admitted to the hospital with the working diagnosis of heart failure exacerbation complicated with renal failure.   58 yo female with the past medical history of aortic stenosis, coronary artery disease, mitral regurgitation, heart failure, peripheral vascular disease and hypertension who presented with dyspnea.  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 She was discharged to CIR. On 07/15 she was found volume overloaded, worsening peripheral edema, positive dyspnea and 02 saturation in the 80's. Her blood pressure was 117/72, HR 123, RR 25 and 02 saturation 94% on supplemental 02 per [place].  Lungs with bilateral rales, increased work of breathing, heart with S1 and S2 present and regular, positive systolic murmur at the base, abdomen with no distention and positive right lower extremity edema.    Na 135, K 4.4 Cl 100 bicarbonate 23 glucose 181, BUN 24 cr 1.28  High sensitive troponin 489, 554, 671  Wbc 11.4 hgb 9,1 plt 496   EKG 105 bpm, normal axis, normal intervals qtc 452, sinus rhythm with no significant ST segment or T wave changes.   Chest radiograph with cardiomegaly with diffuse bilateral interstitial infiltrates.   7/16 admission 7/17 ICU, intubated 7/18 started spiking fever with Tmax 101.5, tachycardic.  X-ray chest still showing bilateral infiltrate but FiO2 is coming down.  Lasix  infusion increased to 30 mg/h 7/19 started on CRRT, tolerating spontaneous breathing trial 7/20 extubated 7/22 CRRT holiday, Levophed /dobutamine  7/23 lasix  challenge, transfused 1u PRBC, remains low dose NE, DBA 2.5, CVP 7 7/24 laisx, off DBA  07/26 transferred to [facility].  07/27 renal function improving.  07/28 improved volume status,  noted high BUN.  07/29 pending improvement in BUN  07/30 BUN has stabilized.  7/31: PCCM was called for eval. PCXR: worse pulm edema and bilateral pleural effusions. Negative 1.6 L fluid balance in the last 3 days Placed on non invasive mechanical ventilation, Bipap.  8/2 started with hematuria, Foley is leaking, urology unable to irrigate the bladder, went to the OR, underwent cystoscopy which showed large clot in urinary bladder no lesion no bladder perf. Clot was evacuated, placed on CBI. Etiology of clot not clear. Urology hypothesizing UTI  Remained intubated post urologic procedure.  8/3 remained on vent. CBI continued  8/4 passed SBT extubated to BIPAP. CVP ~10. Clear liq diet started. Coughing intermittently SLP eval requested  08/06 transferred to [facility]  Assessment and Plan: * Acute on chronic systolic CHF (congestive heart failure) (HCC) 07/18 echocardiogram with reduced LV systolic function to 25%, mild to moderate MR, mild AS.  07/23 limited echocardiogram with reduced LV systolic function with EF 25 to 30%, global hypokinesis, mild to moderate MR, moderate to severe aortic stenosis,   Patient required vasopressors and inotropic support with norepinephrine  and dobutamine , off 07/24.  Cardiogenic shock has resolved.   08/01 cardiac catheterization  RA 8  RV 58/15  PA 60/25 mean 40  PCWP mean 29 with prominent v waves to 43  Cardiac output 5.28 and index 2,77 Fick)  PVR 2,1 WU   Patient was placed on midodrine  for blood pressure support and loop diuretic for volume control.     Systolic blood pressure is 100 mmHg range.   Holding on afterload reduction due to risk of hypotension.   Acute hypoxemia  and hypercapnic respiratory failure due to acute cardiogenic pulmonary edema and aspiration pneumonia.  Liberated from invasive mechanical ventilation on 07/20.  Completed 7 days of antibiotic therapy. 02 saturation 96% on  2L min per [place], continue supplemental 02 at home, since  room air 02 was 87%   Required second intubation post urologic procedure.  Liberated from mechanical ventilation 08/04  02 saturation 95% on 1 L/min per [place]   Patient is not a TAVR or SAVR candidate.   CAD (coronary artery disease) No chest pain, no acute coronary syndrome.  Continue aspirin  and clopidogrel .  Statin   Hypertension Holding antihypertensive medications due to risk of hypotension.   Chronic kidney disease, stage 3a (HCC) AKI, hyponatremia, hypokalemia, hypomagnesemia urinary retention.   Patient required renal replacement therapy 07/22 discontinued CRRT.  07/24 urinary retention (650 ml) placed foley.  07/26 Foley removed last night, check bladder scan.  07/27 tolerating well off foley.   Patient developed hematuria and required replacement of foley catheter.  08/02 cystoscopy, evacuation of clot from bladder and fulguration of bleeding.  Continuous bladder irrigation  Urinary tract infection required IV ceftriaxone  (not related to urinary cathter/ not present on admission)   Today renal function with serum cr at 1,50 with K at 3,9 and serum bicarbonate at 28  Na 136   Type 2 diabetes mellitus with hyperlipidemia (HCC) Hyperglycemia  Patient was placed on insulin  sliding scale for glucose cover and monitoring.   Basal insulin   Fasting glucose today 175 mg /dl   Continue statin therapy.  S/P BKA (below knee amputation) unilateral, left (HCC) Right foot skin ulceration.   Recent MRSA bacteremia, completed daptomycin  and 7 days of linezolid  with Zosyn , on 07/24   Peripheral vascular disease.  08/02/2022 she underwent left SFA and above-knee popliteal percutaneous thrombectomy with JETI and additional angioplasty and stenting into the above-knee popliteal artery for an occluded distal SFA stent with tissue loss. She then on 08/06/2022 underwent laser arthrectomy of the right SFA above-knee popliteal with DCB for in-stent stenosis   Hypothyroidism Continue  levothyroxine    GERD (gastroesophageal reflux disease) Continue with ppi  Iron  deficiency anemia 07/23 transfused one unit PRBC.  Follow up hgb is 7.4  B 12 658 Follow up cell count as outpatient.        Subjective: Patient is feeling better, no chest pain or dyspnea, no edema or abdominal pain, no hematuria, asking to remove foley catheter due to local discomfort.   Physical Exam: Vitals:   12/04/23 2306 12/05/23 0000 12/05/23 0100 12/05/23 0420  BP: 115/63   100/61  Pulse: 81 78 75 84  Resp: (!) 21   19  Temp: (!) 97.5 F (36.4 C)   97.7 F (36.5 C)  TempSrc: Oral   Oral  SpO2: 98% 100% 99% 95%  Weight:    84 kg  Height:       Neurology awake and alert ENT with mild pallor with no icterus Cardiovascular with S1 and S2 present and regular with no gallops or rubs, positive systolic murmur at the base Respiratory with no rales or wheezing, no rhonchi  Abdomen with no distention  Left BKA with right transmetatarsal amputation, no edema.   Data Reviewed:    Family Communication: I spoke with patient's mother  at the bedside, we talked in detail about patient's condition, plan of care and prognosis and all questions were addressed.   Disposition: Status is: Inpatient Remains inpatient appropriate because: plan for discharge tomorrow if tolerating well off  foley   Planned Discharge Destination: Home    Author: Elidia Toribio Furnace, MD 12/05/2023 7:41 AM  For on call review [email or web address].

## 2023-12-05 NOTE — Progress Notes (Signed)
 Calorie Count Note  48 hour calorie count ordered.  Calorie Count did not make it with transfer to 3E, no meal tickets saved.  Diet: HH/CC menu, regular texture, thin liquids Supplements: Ensure Plus High Protein TID  8/6-8/7 Breakfast: 223 kcal, 9 g pro (based on 90% meal documented) Lunch: 413 kcal, 27 g pro (based on 85% meal documented) Dinner: Not documented Supplements: 3 Ensure Plus High Protein (1050 kcal, 60 g pro)  Total intake: 1686 kcal (94% of minimum estimated needs)  96 protein (100% of minimum estimated needs)  Kcal: 1800-2000  Protein: 90-105 grams Fluid: 1.5L   Nutrition Diagnosis: Inadequate oral intake related to acute illness as evidenced by NPO status    Goal: Patient will meet greater than or equal to 90% of their needs    Intervention:  Ensure Plus High Protein   PLAN Pt appears to be consuming adequate nutritional intake. Will d/c calorie count and not appropriate for [place] at this time. Continue with current interventions, will continue to monitor, RDN available prn.   Chanee Henrickson Daml-Budig, RDN, LDN Registered Dietitian Nutritionist RD Inpatient Contact Info in [place]

## 2023-12-05 NOTE — Plan of Care (Signed)
   Problem: Coping: Goal: Level of anxiety will decrease Outcome: Progressing   Problem: Pain Managment: Goal: General experience of comfort will improve and/or be controlled Outcome: Progressing

## 2023-12-05 NOTE — Progress Notes (Signed)
 Occupational Therapy Treatment Patient Details Name: Kayla Schmitt MRN: [id number] DOB: [birth year] Today's Date: 12/05/2023   History of present illness 58 y.o female admitted 7/15 from [facility] for SOB, CHF exacerbation. 7/17 attempted RHC but couldn't lie flat with pulmonary edema and decompensation requiring intubation. 7/19 CRRT. 7/20 extubation. Planned discharge 7/30 held due to leukocytosis, patient having dysuria. 8/2 underwent cystoscopy with clot evacuation, remained intubated. Extubated 8/4 on bipap. PMHx: admission 6/22-7/1 for LLE pain, sepsis, necrotizing fasciitis. 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 CAD, aortic stenosis, HTN, CHF, PAD, bil transmet amputation, HLD, hypothyroidsim, T2DM.   OT comments  Pt making continued progress towards OT goals and continues to endorse goal to DC home with HHOT follow up rather than a postacute rehab stay. Pt reporting need for bathroom assistance; initially requesting bedpan but with encouragement, pt agreeable to attempt transfer to/from drop arm BSC. Pt able to manage BSC transfer with overall CGA but did require Mod A to correct LOB d/t R LE/shoe instability. Pt able to manage toileting hygiene with Min A. [name] present at start of session but did not stay to observe mobility; pt reports mother able to provide light ADL assist but unable to provide physical assistance for transfers. Will continue to follow acutely.       If plan is discharge home, recommend the following:  A little help with walking and/or transfers;A lot of help with bathing/dressing/bathroom   Equipment Recommendations  None recommended by OT    Recommendations for Other Services      Precautions / Restrictions Precautions Precautions: Fall;Other (comment) Recall of Precautions/Restrictions: Impaired Precaution/Restrictions Comments: R transmet amputation, L BKA Required Braces or Orthoses: Other Brace Other Brace: Limb guard Restrictions Weight Bearing Restrictions Per Provider  Order: Yes LLE Weight Bearing Per Provider Order: Non weight bearing       Mobility Bed Mobility Overal bed mobility: Needs Assistance Bed Mobility: Supine to Sit, Sit to Supine     Supine to sit: Supervision, HOB elevated Sit to supine: Supervision        Transfers Overall transfer level: Needs assistance Equipment used: None Transfers: Bed to chair/wheelchair/BSC            Lateral/Scoot Transfers: Contact guard assist General transfer comment: CGA to R side to drop arm BSC. CGA for scoot back to bed but did require Mod A to correct anterior LOB and prevent fall due to R tennis shoe slipping off mid transfer. once shoe replaced and foot secured, pt able to complete remainder of transfer     Balance Overall balance assessment: Needs assistance Sitting-balance support: No upper extremity supported, Feet unsupported Sitting balance-Leahy Scale: Good                                     ADL either performed or assessed with clinical judgement   ADL Overall ADL's : Needs assistance/impaired                       Lower Body Dressing Details (indicate cue type and reason): pt able to don R tennis shoe sitting EOB Toilet Transfer: Contact guard assist;BSC/3in1 Toilet Transfer Details (indicate cue type and reason): CGA to scoot to drop arm BSC with cues for sequencing and to reach for [facility] armrest. CGA for scoot back to bed but did require Mod A to correct anterior LOB and prevent fall due to R tennis shoe slipping off  Toileting- Clothing Manipulation and Hygiene: Minimal assistance;Sitting/lateral lean Toileting - Clothing Manipulation Details (indicate cue type and reason): extended time for hygiene. upon scooting back to bed, noted stool matter still present with assist to clean up            Extremity/Trunk Assessment Upper Extremity Assessment Upper Extremity Assessment: Generalized weakness;Right hand dominant   Lower Extremity  Assessment Lower Extremity Assessment: Defer to PT evaluation        Vision   Vision Assessment?: No apparent visual deficits   Perception     Praxis     Communication Communication Communication: No apparent difficulties   Cognition Arousal: Alert Behavior During Therapy: WFL for tasks assessed/performed Cognition: No family/caregiver present to determine baseline             OT - Cognition Comments: STM deficits present; unsure if baseline. questionable overall awareness. OT unable to locate pt's sketchers/converse shoe but reported her other tennis shoe would be fine. pt required assistance to prevent a fall then reported I knew I needed that other shoe. This one doesnt work                 Following commands: Impaired Following commands impaired: Follows multi-step commands with increased time      Cueing   Cueing Techniques: Verbal cues, Gestural cues  Exercises      Shoulder Instructions       General Comments Mother present initially but left when OT session started    Pertinent Vitals/ Pain       Pain Assessment Pain Assessment: No/denies pain  Home Living                                          Prior Functioning/Environment              Frequency  Min 2X/week        Progress Toward Goals  OT Goals(current goals can now be found in the care plan section)  Progress towards OT goals: Progressing toward goals  Acute Rehab OT Goals Patient Stated Goal: go home hopefully Saturday OT Goal Formulation: With patient Time For Goal Achievement: 12/10/23 Potential to Achieve Goals: Good ADL Goals Pt Will Perform Lower Body Dressing: with supervision;sit to/from stand;sitting/lateral leans Pt Will Transfer to Toilet: with supervision;bedside commode;with transfer board;squat pivot transfer;stand pivot transfer Pt Will Perform Toileting - Clothing Manipulation and hygiene: with supervision;sitting/lateral leans  Plan       Co-evaluation                 AM-PAC OT 6 Clicks Daily Activity     Outcome Measure   Help from another person eating meals?: None Help from another person taking care of personal grooming?: A Little Help from another person toileting, which includes using toliet, bedpan, or urinal?: A Lot Help from another person bathing (including washing, rinsing, drying)?: A Lot Help from another person to put on and taking off regular upper body clothing?: A Little Help from another person to put on and taking off regular lower body clothing?: A Lot 6 Click Score: 16    End of Session Equipment Utilized During Treatment: Oxygen   OT Visit Diagnosis: Unsteadiness on feet (R26.81);Other abnormalities of gait and mobility (R26.89);Muscle weakness (generalized) (M62.81)   Activity Tolerance Patient tolerated treatment well   Patient Left in bed;with call bell/phone within reach   Nurse  Communication Mobility status        Time: 9157-9061 OT Time Calculation (min): 56 min  Charges: OT General Charges $OT Visit: 1 Visit OT Treatments $Self Care/Home Management : 38-52 mins $Therapeutic Activity: 8-22 mins  Mliss NOVAK, OTR/L Acute Rehab Services Office: [phone]   Mliss Fish 12/05/2023, 9:56 AM

## 2023-12-06 ENCOUNTER — Other Ambulatory Visit (HOSPITAL_COMMUNITY): Payer: Self-pay

## 2023-12-06 DIAGNOSIS — R310 Gross hematuria: Secondary | ICD-10-CM

## 2023-12-06 DIAGNOSIS — R338 Other retention of urine: Secondary | ICD-10-CM

## 2023-12-06 DIAGNOSIS — Z515 Encounter for palliative care: Secondary | ICD-10-CM

## 2023-12-06 DIAGNOSIS — Z7189 Other specified counseling: Secondary | ICD-10-CM

## 2023-12-06 LAB — GLUCOSE, CAPILLARY
Glucose-Capillary: 174 mg/dL — ABNORMAL HIGH (ref 70–99)
Glucose-Capillary: 199 mg/dL — ABNORMAL HIGH (ref 70–99)

## 2023-12-06 LAB — BASIC METABOLIC PANEL WITH GFR
Anion gap: 14 (ref 5–15)
BUN: 79 mg/dL — ABNORMAL HIGH (ref 6–20)
CO2: 29 mmol/L (ref 22–32)
Calcium: 9.2 mg/dL (ref 8.9–10.3)
Chloride: 94 mmol/L — ABNORMAL LOW (ref 98–111)
Creatinine, Ser: 1.5 mg/dL — ABNORMAL HIGH (ref 0.44–1.00)
GFR, Estimated: 41 mL/min — ABNORMAL LOW (ref >60)
Glucose, Bld: 189 mg/dL — ABNORMAL HIGH (ref 70–99)
Potassium: 3.9 mmol/L (ref 3.5–5.1)
Sodium: 137 mmol/L (ref 135–145)

## 2023-12-06 LAB — CBC
HCT: 28.4 % — ABNORMAL LOW (ref 36.0–46.0)
Hemoglobin: 8.6 g/dL — ABNORMAL LOW (ref 12.0–15.0)
MCH: 29.5 pg (ref 26.0–34.0)
MCHC: 30.3 g/dL (ref 30.0–36.0)
MCV: 97.3 fL (ref 80.0–100.0)
Platelets: 300 K/uL (ref 150–400)
RBC: 2.92 MIL/uL — ABNORMAL LOW (ref 3.87–5.11)
RDW: 20.4 % — ABNORMAL HIGH (ref 11.5–15.5)
WBC: 6.8 K/uL (ref 4.0–10.5)
nRBC: 0 % (ref 0.0–0.2)

## 2023-12-06 MED ORDER — MIDODRINE HCL 5 MG PO TABS
5.0000 mg | ORAL_TABLET | Freq: Three times a day (TID) | ORAL | 0 refills | Status: DC
  Filled 2023-12-06: qty 90, 30d supply, fill #0

## 2023-12-06 NOTE — TOC Transition Note (Addendum)
 Transition of Care [facility]) - Discharge Note   Patient Details  Name: Kayla Schmitt MRN: [id number] Date of Birth: May 18, 1965  Transition of Care [facility]) CM/SW Contact:  Justina Delcia Czar, RN Phone Number: 12/06/2023, 11:35 AM   Clinical Narrative:      [name] rep, [name] to make aware of dc home today with [facility].  Hospital follow up appt scheduled for 12/11/2023 at 12:45 pm. Mother will provide transportation home.   Contacted [employer] rep, Zack to make aware to deliver oxygen  concentrator to home.    Final next level of care: Home w Home Health Services Barriers to Discharge: No Barriers Identified   Patient Goals and CMS Choice Patient states their goals for this hospitalization and ongoing recovery are:: wants to remain independent CMS Medicare.gov Compare Post Acute Care list provided to:: Patient Choice offered to / list presented to : Patient      Discharge Placement                       Discharge Plan and Services Additional resources added to the After Visit Summary for     Discharge Planning Services: CM Consult Post Acute Care Choice: IP Rehab                    HH Arranged: RN, PT, OT [facility] Agency: [facility] Date [facility] Agency Contacted: 11/26/23 Time HH Agency Contacted: [id number] Representative spoke with at [facility] Agency: [name]  Social Drivers of Health (SDOH) Interventions SDOH Screenings   Food Insecurity: No Food Insecurity (11/14/2023)  Housing: Low Risk  (11/14/2023)  Transportation Needs: No Transportation Needs (11/14/2023)  Utilities: Not At Risk (11/14/2023)  Financial Resource Strain: Low Risk  (10/19/2023)   Received from [facility]  Tobacco Use: Medium Risk (11/15/2023)     Readmission Risk Interventions    11/14/2023   10:15 AM 08/14/2022    4:37 PM  Readmission Risk Prevention Plan  Post Dischage Appt  Complete  Medication Screening  Complete  Transportation Screening Complete Complete  Medication Review (RN Care  Manager) Complete   HRI or Home Care Consult Complete   Palliative Care Screening Not Applicable   Skilled Nursing Facility Not Applicable

## 2023-12-06 NOTE — Plan of Care (Incomplete)
 Problem: Clinical Measurements: Goal: Ability to maintain clinical measurements within normal limits will improve 12/06/2023 1023 by Kayla Cathryne SAILOR, RN Outcome: Adequate for Discharge 12/06/2023 1023 by Kayla Cathryne SAILOR, RN Outcome: Progressing Goal: Will remain free from infection 12/06/2023 1023 by Kayla Cathryne SAILOR, RN Outcome: Adequate for Discharge 12/06/2023 1023 by Kayla Cathryne SAILOR, RN Outcome: Progressing Goal: Diagnostic test results will improve 12/06/2023 1023 by Kayla Cathryne SAILOR, RN Outcome: Adequate for Discharge 12/06/2023 1023 by Kayla Cathryne SAILOR, RN Outcome: Progressing Goal: Respiratory complications will improve 12/06/2023 1023 by Kayla Cathryne SAILOR, RN Outcome: Adequate for Discharge 12/06/2023 1023 by Kayla Cathryne SAILOR, RN Outcome: Progressing 12/06/2023 1022 by Kayla Cathryne SAILOR, RN Outcome: Progressing Goal: Cardiovascular complication will be avoided 12/06/2023 1023 by Kayla Cathryne SAILOR, RN Outcome: Adequate for Discharge 12/06/2023 1023 by Kayla Cathryne SAILOR, RN Outcome: Progressing   Problem: Activity: Goal: Risk for activity intolerance will decrease 12/06/2023 1023 by Kayla Cathryne SAILOR, RN Outcome: Adequate for Discharge 12/06/2023 1023 by Kayla Cathryne SAILOR, RN Outcome: Progressing   Problem: Nutrition: Goal: Adequate nutrition will be maintained 12/06/2023 1023 by Kayla Cathryne SAILOR, RN Outcome: Adequate for Discharge 12/06/2023 1023 by Kayla Cathryne SAILOR, RN Outcome: Progressing   Problem: Coping: Goal: Level of anxiety will decrease 12/06/2023 1023 by Kayla Cathryne SAILOR, RN Outcome: Adequate for Discharge 12/06/2023 1023 by Kayla Cathryne SAILOR, RN Outcome: Progressing   Problem: Elimination: Goal: Will not experience complications related to bowel motility 12/06/2023 1023 by Kayla Cathryne SAILOR, RN Outcome: Adequate for Discharge 12/06/2023 1023 by Kayla Cathryne SAILOR, RN Outcome: Progressing Goal: Will not experience complications related to urinary retention 12/06/2023 1023 by Kayla Cathryne SAILOR, RN Outcome: Adequate for Discharge 12/06/2023 1023 by Kayla Cathryne SAILOR, RN Outcome: Progressing   Problem: Pain Managment: Goal: General experience of comfort will improve and/or be controlled 12/06/2023 1023 by Kayla Cathryne SAILOR, RN Outcome: Adequate for Discharge 12/06/2023 1023 by Kayla Cathryne SAILOR, RN Outcome: Progressing   Problem: Safety: Goal: Ability to remain free from injury will improve 12/06/2023 1023 by Kayla Cathryne SAILOR, RN Outcome: Adequate for Discharge 12/06/2023 1023 by Kayla Cathryne SAILOR, RN Outcome: Progressing   Problem: Skin Integrity: Goal: Risk for impaired skin integrity will decrease 12/06/2023 1023 by Kayla Cathryne SAILOR, RN Outcome: Adequate for Discharge 12/06/2023 1023 by Kayla Cathryne SAILOR, RN Outcome: Progressing   Problem: Coping: Goal: Ability to adjust to condition or change in health will improve 12/06/2023 1023 by Kayla Cathryne SAILOR, RN Outcome: Adequate for Discharge 12/06/2023 1023 by Kayla Cathryne SAILOR, RN Outcome: Progressing   Problem: Fluid Volume: Goal: Ability to maintain a balanced intake and output will improve 12/06/2023 1023 by Kayla Cathryne SAILOR, RN Outcome: Adequate for Discharge 12/06/2023 1023 by Kayla Cathryne SAILOR, RN Outcome: Progressing   Problem: Metabolic: Goal: Ability to maintain appropriate glucose levels will improve 12/06/2023 1023 by Kayla Cathryne SAILOR, RN Outcome: Adequate for Discharge 12/06/2023 1023 by Kayla Cathryne SAILOR, RN Outcome: Progressing   Problem: Nutritional: Goal: Maintenance of adequate nutrition will improve 12/06/2023 1023 by Kayla Cathryne SAILOR, RN Outcome: Adequate for Discharge 12/06/2023 1023 by Kayla Cathryne SAILOR, RN Outcome: Progressing Goal: Progress toward achieving an optimal weight will improve 12/06/2023 1023 by Kayla Cathryne SAILOR, RN Outcome: Adequate for Discharge 12/06/2023 1023 by Kayla Cathryne SAILOR, RN Outcome: Progressing   Problem: Skin Integrity: Goal: Risk for impaired skin integrity will decrease 12/06/2023  1023 by Kayla Cathryne SAILOR, RN Outcome: Adequate for Discharge 12/06/2023 1023 by Kayla Cathryne SAILOR, RN Outcome: Progressing  Problem: Tissue Perfusion: Goal: Adequacy of tissue perfusion will improve 12/06/2023 1023 by Sarya Linenberger N, RN Outcome: Adequate for Discharge 12/06/2023 1023 by Kayla Cathryne SAILOR, RN Outcome: Progressing   Problem: Activity: Goal: Ability to return to baseline activity level will improve 12/06/2023 1023 by Kayla Cathryne SAILOR, RN Outcome: Adequate for Discharge 12/06/2023 1023 by Kayla Cathryne SAILOR, RN Outcome: Progressing   Problem: Cardiovascular: Goal: Ability to achieve and maintain adequate cardiovascular perfusion will improve 12/06/2023 1023 by Kayla Cathryne SAILOR, RN Outcome: Adequate for Discharge 12/06/2023 1023 by Kayla Cathryne SAILOR, RN Outcome: Progressing   Problem: Education: Goal: Ability to demonstrate management of disease process will improve 12/06/2023 1023 by Kayla Cathryne SAILOR, RN Outcome: Adequate for Discharge 12/06/2023 1023 by Kayla Cathryne SAILOR, RN Outcome: Progressing Goal: Ability to verbalize understanding of medication therapies will improve 12/06/2023 1023 by Kayla Cathryne SAILOR, RN Outcome: Adequate for Discharge 12/06/2023 1023 by Kayla Cathryne SAILOR, RN Outcome: Progressing Goal: Individualized Educational Video(s) 12/06/2023 1023 by Kayla Cathryne SAILOR, RN Outcome: Adequate for Discharge 12/06/2023 1023 by Kayla Cathryne SAILOR, RN Outcome: Progressing   Problem: Activity: Goal: Capacity to carry out activities will improve 12/06/2023 1023 by Kayla Cathryne SAILOR, RN Outcome: Adequate for Discharge 12/06/2023 1023 by Kayla Cathryne SAILOR, RN Outcome: Progressing   Problem: Cardiac: Goal: Ability to achieve and maintain adequate cardiopulmonary perfusion will improve 12/06/2023 1023 by Kayla Cathryne SAILOR, RN Outcome: Adequate for Discharge 12/06/2023 1023 by Kayla Cathryne SAILOR, RN Outcome: Progressing   Problem: Education: Goal: Knowledge of disease and its progression  will improve 12/06/2023 1023 by Kayla Cathryne SAILOR, RN Outcome: Adequate for Discharge 12/06/2023 1023 by Kayla Cathryne SAILOR, RN Outcome: Progressing Goal: Individualized Educational Video(s) 12/06/2023 1023 by Kayla Cathryne SAILOR, RN Outcome: Adequate for Discharge 12/06/2023 1023 by Kayla Cathryne SAILOR, RN Outcome: Progressing   Problem: Fluid Volume: Goal: Compliance with measures to maintain balanced fluid volume will improve 12/06/2023 1023 by Kayla Cathryne SAILOR, RN Outcome: Adequate for Discharge 12/06/2023 1023 by Kayla Cathryne SAILOR, RN Outcome: Progressing   Problem: Health Behavior/Discharge Planning: Goal: Ability to manage health-related needs will improve 12/06/2023 1023 by Kayla Cathryne SAILOR, RN Outcome: Adequate for Discharge 12/06/2023 1023 by Kayla Cathryne SAILOR, RN Outcome: Progressing   Problem: Nutritional: Goal: Ability to make healthy dietary choices will improve 12/06/2023 1023 by Kayla Cathryne SAILOR, RN Outcome: Adequate for Discharge 12/06/2023 1023 by Kayla Cathryne SAILOR, RN Outcome: Progressing   Problem: Clinical Measurements: Goal: Complications related to the disease process, condition or treatment will be avoided or minimized 12/06/2023 1023 by Kayla Cathryne SAILOR, RN Outcome: Adequate for Discharge 12/06/2023 1023 by Kayla Cathryne SAILOR, RN Outcome: Progressing   Problem: Activity: Goal: Ability to tolerate increased activity will improve 12/06/2023 1023 by Kayla Cathryne SAILOR, RN Outcome: Adequate for Discharge 12/06/2023 1023 by Kayla Cathryne SAILOR, RN Outcome: Progressing   Problem: Respiratory: Goal: Ability to maintain a clear airway and adequate ventilation will improve 12/06/2023 1023 by Kayla Cathryne SAILOR, RN Outcome: Adequate for Discharge 12/06/2023 1023 by Kayla Cathryne SAILOR, RN Outcome: Progressing   Problem: Role Relationship: Goal: Method of communication will improve 12/06/2023 1023 by Kayla Cathryne SAILOR, RN Outcome: Adequate for Discharge 12/06/2023 1023 by Kayla Cathryne SAILOR, RN Outcome:  Progressing

## 2023-12-06 NOTE — Plan of Care (Signed)
   Problem: Clinical Measurements: Goal: Respiratory complications will improve Outcome: Progressing

## 2023-12-06 NOTE — Plan of Care (Signed)
 Problem: Clinical Measurements: Goal: Ability to maintain clinical measurements within normal limits will improve 12/06/2023 1023 by Gail Cathryne SAILOR, RN Outcome: Adequate for Discharge 12/06/2023 1023 by Gail Cathryne SAILOR, RN Outcome: Adequate for Discharge 12/06/2023 1023 by Gail Cathryne SAILOR, RN Outcome: Progressing Goal: Will remain free from infection 12/06/2023 1023 by Gail Cathryne SAILOR, RN Outcome: Adequate for Discharge 12/06/2023 1023 by Gail Cathryne SAILOR, RN Outcome: Adequate for Discharge 12/06/2023 1023 by Gail Cathryne SAILOR, RN Outcome: Progressing Goal: Diagnostic test results will improve 12/06/2023 1023 by Gail Cathryne SAILOR, RN Outcome: Adequate for Discharge 12/06/2023 1023 by Gail Cathryne SAILOR, RN Outcome: Adequate for Discharge 12/06/2023 1023 by Gail Cathryne SAILOR, RN Outcome: Progressing Goal: Respiratory complications will improve 12/06/2023 1023 by Gail Cathryne SAILOR, RN Outcome: Adequate for Discharge 12/06/2023 1023 by Gail Cathryne SAILOR, RN Outcome: Adequate for Discharge 12/06/2023 1023 by Gail Cathryne SAILOR, RN Outcome: Progressing 12/06/2023 1022 by Gail Cathryne SAILOR, RN Outcome: Progressing Goal: Cardiovascular complication will be avoided 12/06/2023 1023 by Gail Cathryne SAILOR, RN Outcome: Adequate for Discharge 12/06/2023 1023 by Gail Cathryne SAILOR, RN Outcome: Adequate for Discharge 12/06/2023 1023 by Gail Cathryne SAILOR, RN Outcome: Progressing   Problem: Activity: Goal: Risk for activity intolerance will decrease 12/06/2023 1023 by Gail Cathryne SAILOR, RN Outcome: Adequate for Discharge 12/06/2023 1023 by Gail Cathryne SAILOR, RN Outcome: Adequate for Discharge 12/06/2023 1023 by Gail Cathryne SAILOR, RN Outcome: Progressing   Problem: Nutrition: Goal: Adequate nutrition will be maintained 12/06/2023 1023 by Gail Cathryne SAILOR, RN Outcome: Adequate for Discharge 12/06/2023 1023 by Gail Cathryne SAILOR, RN Outcome: Adequate for Discharge 12/06/2023 1023 by Gail Cathryne SAILOR, RN Outcome: Progressing   Problem:  Coping: Goal: Level of anxiety will decrease 12/06/2023 1023 by Gail Cathryne SAILOR, RN Outcome: Adequate for Discharge 12/06/2023 1023 by Gail Cathryne SAILOR, RN Outcome: Adequate for Discharge 12/06/2023 1023 by Gail Cathryne SAILOR, RN Outcome: Progressing   Problem: Elimination: Goal: Will not experience complications related to bowel motility 12/06/2023 1023 by Gail Cathryne SAILOR, RN Outcome: Adequate for Discharge 12/06/2023 1023 by Gail Cathryne SAILOR, RN Outcome: Adequate for Discharge 12/06/2023 1023 by Gail Cathryne SAILOR, RN Outcome: Progressing Goal: Will not experience complications related to urinary retention 12/06/2023 1023 by Gail Cathryne SAILOR, RN Outcome: Adequate for Discharge 12/06/2023 1023 by Gail Cathryne SAILOR, RN Outcome: Adequate for Discharge 12/06/2023 1023 by Gail Cathryne SAILOR, RN Outcome: Progressing   Problem: Pain Managment: Goal: General experience of comfort will improve and/or be controlled 12/06/2023 1023 by Gail Cathryne SAILOR, RN Outcome: Adequate for Discharge 12/06/2023 1023 by Gail Cathryne SAILOR, RN Outcome: Adequate for Discharge 12/06/2023 1023 by Gail Cathryne SAILOR, RN Outcome: Progressing   Problem: Safety: Goal: Ability to remain free from injury will improve 12/06/2023 1023 by Gail Cathryne SAILOR, RN Outcome: Adequate for Discharge 12/06/2023 1023 by Gail Cathryne SAILOR, RN Outcome: Adequate for Discharge 12/06/2023 1023 by Gail Cathryne SAILOR, RN Outcome: Progressing   Problem: Skin Integrity: Goal: Risk for impaired skin integrity will decrease 12/06/2023 1023 by Gail Cathryne SAILOR, RN Outcome: Adequate for Discharge 12/06/2023 1023 by Gail Cathryne SAILOR, RN Outcome: Adequate for Discharge 12/06/2023 1023 by Gail Cathryne SAILOR, RN Outcome: Progressing   Problem: Coping: Goal: Ability to adjust to condition or change in health will improve 12/06/2023 1023 by Gail Cathryne SAILOR, RN Outcome: Adequate for Discharge 12/06/2023 1023 by Gail Cathryne SAILOR, RN Outcome: Adequate for Discharge 12/06/2023 1023  by Gail Cathryne SAILOR, RN Outcome: Progressing   Problem: Fluid Volume: Goal: Ability to  maintain a balanced intake and output will improve 12/06/2023 1023 by Gail Cathryne SAILOR, RN Outcome: Adequate for Discharge 12/06/2023 1023 by Gail Cathryne SAILOR, RN Outcome: Adequate for Discharge 12/06/2023 1023 by Gail Cathryne SAILOR, RN Outcome: Progressing   Problem: Metabolic: Goal: Ability to maintain appropriate glucose levels will improve 12/06/2023 1023 by Gail Cathryne SAILOR, RN Outcome: Adequate for Discharge 12/06/2023 1023 by Gail Cathryne SAILOR, RN Outcome: Adequate for Discharge 12/06/2023 1023 by Gail Cathryne SAILOR, RN Outcome: Progressing   Problem: Nutritional: Goal: Maintenance of adequate nutrition will improve 12/06/2023 1023 by Gail Cathryne SAILOR, RN Outcome: Adequate for Discharge 12/06/2023 1023 by Gail Cathryne SAILOR, RN Outcome: Adequate for Discharge 12/06/2023 1023 by Gail Cathryne SAILOR, RN Outcome: Progressing Goal: Progress toward achieving an optimal weight will improve 12/06/2023 1023 by Gail Cathryne SAILOR, RN Outcome: Adequate for Discharge 12/06/2023 1023 by Gail Cathryne SAILOR, RN Outcome: Adequate for Discharge 12/06/2023 1023 by Gail Cathryne SAILOR, RN Outcome: Progressing   Problem: Skin Integrity: Goal: Risk for impaired skin integrity will decrease 12/06/2023 1023 by Gail Cathryne SAILOR, RN Outcome: Adequate for Discharge 12/06/2023 1023 by Gail Cathryne SAILOR, RN Outcome: Adequate for Discharge 12/06/2023 1023 by Gail Cathryne SAILOR, RN Outcome: Progressing   Problem: Tissue Perfusion: Goal: Adequacy of tissue perfusion will improve 12/06/2023 1023 by Gail Cathryne SAILOR, RN Outcome: Adequate for Discharge 12/06/2023 1023 by Gail Cathryne SAILOR, RN Outcome: Adequate for Discharge 12/06/2023 1023 by Gail Cathryne SAILOR, RN Outcome: Progressing   Problem: Activity: Goal: Ability to return to baseline activity level will improve 12/06/2023 1023 by Gail Cathryne SAILOR, RN Outcome: Adequate for Discharge 12/06/2023 1023 by  Gail Cathryne SAILOR, RN Outcome: Adequate for Discharge 12/06/2023 1023 by Gail Cathryne SAILOR, RN Outcome: Progressing   Problem: Cardiovascular: Goal: Ability to achieve and maintain adequate cardiovascular perfusion will improve 12/06/2023 1023 by Gail Cathryne SAILOR, RN Outcome: Adequate for Discharge 12/06/2023 1023 by Gail Cathryne SAILOR, RN Outcome: Adequate for Discharge 12/06/2023 1023 by Gail Cathryne SAILOR, RN Outcome: Progressing   Problem: Education: Goal: Ability to demonstrate management of disease process will improve 12/06/2023 1023 by Gail Cathryne SAILOR, RN Outcome: Adequate for Discharge 12/06/2023 1023 by Gail Cathryne SAILOR, RN Outcome: Adequate for Discharge 12/06/2023 1023 by Gail Cathryne SAILOR, RN Outcome: Progressing Goal: Ability to verbalize understanding of medication therapies will improve 12/06/2023 1023 by Gail Cathryne SAILOR, RN Outcome: Adequate for Discharge 12/06/2023 1023 by Gail Cathryne SAILOR, RN Outcome: Adequate for Discharge 12/06/2023 1023 by Gail Cathryne SAILOR, RN Outcome: Progressing Goal: Individualized Educational Video(s) 12/06/2023 1023 by Gail Cathryne SAILOR, RN Outcome: Adequate for Discharge 12/06/2023 1023 by Gail Cathryne SAILOR, RN Outcome: Adequate for Discharge 12/06/2023 1023 by Gail Cathryne SAILOR, RN Outcome: Progressing   Problem: Activity: Goal: Capacity to carry out activities will improve 12/06/2023 1023 by Gail Cathryne SAILOR, RN Outcome: Adequate for Discharge 12/06/2023 1023 by Gail Cathryne SAILOR, RN Outcome: Adequate for Discharge 12/06/2023 1023 by Gail Cathryne SAILOR, RN Outcome: Progressing   Problem: Cardiac: Goal: Ability to achieve and maintain adequate cardiopulmonary perfusion will improve 12/06/2023 1023 by Gail Cathryne SAILOR, RN Outcome: Adequate for Discharge 12/06/2023 1023 by Gail Cathryne SAILOR, RN Outcome: Adequate for Discharge 12/06/2023 1023 by Gail Cathryne SAILOR, RN Outcome: Progressing   Problem: Education: Goal: Knowledge of disease and its progression will  improve 12/06/2023 1023 by Gail Cathryne SAILOR, RN Outcome: Adequate for Discharge 12/06/2023 1023 by Gail Cathryne SAILOR, RN Outcome: Adequate for Discharge 12/06/2023 1023 by Gail Cathryne SAILOR, RN Outcome: Progressing Goal:  Individualized Educational Video(s) 12/06/2023 1023 by Gail Cathryne SAILOR, RN Outcome: Adequate for Discharge 12/06/2023 1023 by Gail Cathryne SAILOR, RN Outcome: Adequate for Discharge 12/06/2023 1023 by Gail Cathryne SAILOR, RN Outcome: Progressing   Problem: Fluid Volume: Goal: Compliance with measures to maintain balanced fluid volume will improve 12/06/2023 1023 by Gail Cathryne SAILOR, RN Outcome: Adequate for Discharge 12/06/2023 1023 by Gail Cathryne SAILOR, RN Outcome: Adequate for Discharge 12/06/2023 1023 by Gail Cathryne SAILOR, RN Outcome: Progressing   Problem: Health Behavior/Discharge Planning: Goal: Ability to manage health-related needs will improve 12/06/2023 1023 by Gail Cathryne SAILOR, RN Outcome: Adequate for Discharge 12/06/2023 1023 by Gail Cathryne SAILOR, RN Outcome: Adequate for Discharge 12/06/2023 1023 by Gail Cathryne SAILOR, RN Outcome: Progressing   Problem: Nutritional: Goal: Ability to make healthy dietary choices will improve 12/06/2023 1023 by Gail Cathryne SAILOR, RN Outcome: Adequate for Discharge 12/06/2023 1023 by Gail Cathryne SAILOR, RN Outcome: Adequate for Discharge 12/06/2023 1023 by Gail Cathryne SAILOR, RN Outcome: Progressing   Problem: Clinical Measurements: Goal: Complications related to the disease process, condition or treatment will be avoided or minimized 12/06/2023 1023 by Gail Cathryne SAILOR, RN Outcome: Adequate for Discharge 12/06/2023 1023 by Gail Cathryne SAILOR, RN Outcome: Adequate for Discharge 12/06/2023 1023 by Gail Cathryne SAILOR, RN Outcome: Progressing   Problem: Activity: Goal: Ability to tolerate increased activity will improve 12/06/2023 1023 by Gail Cathryne SAILOR, RN Outcome: Adequate for Discharge 12/06/2023 1023 by Gail Cathryne SAILOR, RN Outcome: Adequate for  Discharge 12/06/2023 1023 by Gail Cathryne SAILOR, RN Outcome: Progressing   Problem: Respiratory: Goal: Ability to maintain a clear airway and adequate ventilation will improve 12/06/2023 1023 by Gail Cathryne SAILOR, RN Outcome: Adequate for Discharge 12/06/2023 1023 by Gail Cathryne SAILOR, RN Outcome: Adequate for Discharge 12/06/2023 1023 by Gail Cathryne SAILOR, RN Outcome: Progressing   Problem: Role Relationship: Goal: Method of communication will improve 12/06/2023 1023 by Gail Cathryne SAILOR, RN Outcome: Adequate for Discharge 12/06/2023 1023 by Gail Cathryne SAILOR, RN Outcome: Adequate for Discharge 12/06/2023 1023 by Gail Cathryne SAILOR, RN Outcome: Progressing

## 2023-12-06 NOTE — Progress Notes (Signed)
 Patient was getting dressed for discharge and her residual limb started heavily oozing.  There is an approximate 1.5 in open area, could visualize  the area that was oozing. Dr Noralee notified

## 2023-12-06 NOTE — Discharge Summary (Addendum)
 Physician Discharge Summary   Patient: Kayla Schmitt MRN: [id number] DOB: 1965-07-24  Admit date:     11/12/2023  Discharge date: 12/06/23  Discharge Physician: Elidia Sieving Ercel Pepitone   PCP: [name], NP   Recommendations at discharge:      Patient has been placed on torsemide  80 mg bid for diuresis  Limited guideline directed medical therapy for heart failure due to acute worsening GFR and risk of hypotension.  Placed on midodrine  for blood pressure support.  Added supplemental 02 per [place], follow 02 saturation as outpatient,  Follow up cell count, renal function and electrolytes as outpatient in 7 days Follow up with Jacquline Rosan NP in 7 to 10 days  Follow up with Cardiology as scheduled Follow up with Nephrology as scheduled.  Follow up with vascular surgery as scheduled.  Home health services   Discharge Diagnoses: Principal Problem:   Acute on chronic systolic CHF (congestive heart failure) (HCC) Active Problems:   CAD (coronary artery disease)   Hypertension   Chronic kidney disease, stage 3a (HCC)   Type 2 diabetes mellitus with hyperlipidemia (HCC)   Hypothyroidism   S/P BKA (below knee amputation) unilateral, left (HCC)   GERD (gastroesophageal reflux disease)   Iron  deficiency anemia   Advanced care planning/counseling discussion   Goals of care, counseling/discussion   Palliative care by specialist   Gross hematuria   Clot retention of urine  Resolved Problems:   * No resolved hospital problems. [facility] Course: [name] was admitted to the hospital with the working diagnosis of heart failure exacerbation complicated with renal failure.   58 yo female with the past medical history of aortic stenosis, coronary artery disease, mitral regurgitation, heart failure, peripheral vascular disease and hypertension who presented with dyspnea.  Recent hospitalization 06/22 to 10/29/23 for necrotizing fascitis of the left lower extremity  resulting in left BKA. She was discharged to CIR. On 07/15 she was found volume overloaded, worsening peripheral edema, positive dyspnea and 02 saturation in the 80's. Her blood pressure was 117/72, HR 123, RR 25 and 02 saturation 94% on supplemental 02 per [place].  Lungs with bilateral rales, increased work of breathing, heart with S1 and S2 present and regular, positive systolic murmur at the base, abdomen with no distention and positive right lower extremity edema.    Na 135, K 4.4 Cl 100 bicarbonate 23 glucose 181, BUN 24 cr 1.28  High sensitive troponin 489, 554, 671  Wbc 11.4 hgb 9,1 plt 496   EKG 105 bpm, normal axis, normal intervals qtc 452, sinus rhythm with no significant ST segment or T wave changes.   Chest radiograph with cardiomegaly with diffuse bilateral interstitial infiltrates.   7/16 admission 7/17 ICU, intubated 7/18 started spiking fever with Tmax 101.5, tachycardic.  X-ray chest still showing bilateral infiltrate but FiO2 is coming down.  Lasix  infusion increased to 30 mg/h 7/19 started on CRRT, tolerating spontaneous breathing trial 7/20 extubated 7/22 CRRT holiday, Levophed /dobutamine  7/23 lasix  challenge, transfused 1u PRBC, remains low dose NE, DBA 2.5, CVP 7 7/24 laisx, off DBA  07/26 transferred to [facility].  07/27 renal function improving.  07/28 improved volume status, noted high BUN.  07/29 pending improvement in BUN  07/30 BUN has stabilized.  7/31: PCCM was called for eval. PCXR: worse pulm edema and bilateral pleural effusions. Negative 1.6 L fluid balance in the last 3 days Placed on non invasive mechanical ventilation, Bipap.  8/2 started with hematuria, Foley was leaking, urology unable to  irrigate the bladder, went to the OR, underwent cystoscopy which showed large clot in urinary bladder no lesion no bladder perf. Clot was evacuated, placed on CBI. Etiology of clot not clear. Urology hypothesizing UTI, placed on IV antibiotic therapy.  Remained intubated  post urologic procedure.  8/3 remained on vent. CBI continued  8/4 passed SBT extubated to BIPAP. CVP ~10. Clear liq diet started. Coughing intermittently SLP eval requested  08/06 transferred to [facility] 08/07 foley was removed, patient with significant discomfort from catheter, no hematuria. Bladder scans with no urinary retention.  08/08 cental line removed. Patient will be discharge home and follow up as outpatient. Home health services.   Assessment and Plan: * Acute on chronic systolic CHF (congestive heart failure) (HCC) 07/18 echocardiogram with reduced LV systolic function to 25%, mild to moderate MR, mild AS.  07/23 limited echocardiogram with reduced LV systolic function with EF 25 to 30%, global hypokinesis, mild to moderate MR, moderate to severe aortic stenosis,   Patient required vasopressors and inotropic support with norepinephrine  and dobutamine , off 07/24.  Cardiogenic shock has resolved.   08/01 cardiac catheterization  RA 8  RV 58/15  PA 60/25 mean 40  PCWP mean 29 with prominent v waves to 43  Cardiac output 5.28 and index 2,77 Fick)  PVR 2,1 WU   Patient was placed on midodrine  for blood pressure support and loop diuretic for volume control.     Systolic blood pressure is 100 mmHg range.   Holding on afterload reduction due to risk of hypotension.   Acute hypoxemia and hypercapnic respiratory failure due to acute cardiogenic pulmonary edema and aspiration pneumonia.  Liberated from invasive mechanical ventilation on 07/20.  Completed 7 days of antibiotic therapy.  Required second intubation post urologic procedure.  Liberated from mechanical ventilation 08/04  02 saturation 95% on 3 L/min per [place], will continue supplemental home 02 per [place] at discharge.    Patient currently is not a TAVR or SAVR candidate. Will need to be infection free for at least 4 weeks prior considering any valve intervention.   CAD (coronary artery disease) No chest pain, no acute  coronary syndrome.  Continue aspirin  and clopidogrel .  Statin   Hypertension Holding antihypertensive medications due to risk of hypotension.   Chronic kidney disease, stage 3a (HCC) AKI, hyponatremia, hypokalemia, hypomagnesemia urinary retention.   Patient required renal replacement therapy 07/22 discontinued CRRT.  07/24 urinary retention (650 ml) placed foley.  07/26 Foley removed last night, check bladder scan.  07/27 tolerating well off foley.   Patient developed hematuria and required replacement of foley catheter.  08/02 cystoscopy, evacuation of clot from bladder and fulguration of bleeding.  Continuous bladder irrigation  Urinary tract infection required IV ceftriaxone  (not related to urinary cathter/ not present on admission)   Patient with persistent foley discomfort  Catheter was removed 08/07.  No further urinary symptoms, bladder scans with no urinary retention and no signs of recurrent hematuria.  Follow up with urology as outpatient, discussed with urology over the phone.  Type 2 diabetes mellitus with hyperlipidemia (HCC) Hyperglycemia  Patient was placed on insulin  sliding scale for glucose cover and monitoring.   Basal insulin   At the time of discharge patient will resume tresiba    Continue statin therapy.  S/P BKA (below knee amputation) unilateral, left (HCC) Right foot skin ulceration.   Recent MRSA bacteremia, completed daptomycin  and 7 days of linezolid  with Zosyn , on 07/24   Peripheral vascular disease.  08/02/2022 she underwent left SFA  and above-knee popliteal percutaneous thrombectomy with JETI and additional angioplasty and stenting into the above-knee popliteal artery for an occluded distal SFA stent with tissue loss. She then on 08/06/2022 underwent laser arthrectomy of the right SFA above-knee popliteal with DCB for in-stent stenosis   Hypothyroidism Continue levothyroxine    GERD (gastroesophageal reflux disease) Continue with ppi  Iron   deficiency anemia 07/23 transfused one unit PRBC.  08/02 transfused one unit PRBC Follow up hgb is 8.3  B 12 658 Follow up cell count as outpatient.         Consultants: cardiology, nephrology, urology  Procedures performed: cardiac catheterization, cystoscopy (clot evacuation and fulguration)   Disposition: Home Diet recommendation:  Cardiac and Carb modified diet DISCHARGE MEDICATION: Allergies as of 12/06/2023       Reactions   Trental  [pentoxifylline ] Nausea And Vomiting   Vibramycin [doxycycline] Nausea And Vomiting        Medication List     STOP taking these medications    daptomycin  IVPB Commonly known as: CUBICIN    furosemide  40 MG tablet Commonly known as: LASIX    metoprolol  succinate 25 MG 24 hr tablet Commonly known as: TOPROL -XL   oxyCODONE  5 MG immediate release tablet Commonly known as: Roxicodone        TAKE these medications    acetaminophen  500 MG tablet Commonly known as: TYLENOL  Take 1,000 mg by mouth 2 (two) times daily as needed for moderate pain (pain score 4-6), headache or fever.   aspirin  EC 81 MG tablet Take 81 mg by mouth daily.   atorvastatin  40 MG tablet Commonly known as: LIPITOR Take 40 mg by mouth daily.   bisacodyl  10 MG suppository Commonly known as: DULCOLAX Place 1 suppository (10 mg total) rectally daily as needed for moderate constipation or mild constipation.   CertaVite/Antioxidants Tabs Take 1 tablet by mouth daily.   clopidogrel  75 MG tablet Commonly known as: PLAVIX  TAKE 1 TABLET BY MOUTH EVERY DAY WITH BREAKFAST   ferrous sulfate  325 (65 FE) MG EC tablet Take 1 tablet (325 mg total) by mouth daily with breakfast.   gabapentin  300 MG capsule Commonly known as: NEURONTIN  Take 300 mg by mouth at bedtime.   levothyroxine  150 MCG tablet Commonly known as: SYNTHROID  Take 150 mcg by mouth daily before breakfast.   midodrine  5 MG tablet Commonly known as: PROAMATINE  Take 1 tablet (5 mg total) by mouth  3 (three) times daily with meals.   nitroGLYCERIN  0.4 MG SL tablet Commonly known as: Nitrostat  Place 1 tablet (0.4 mg total) under the tongue every 5 (five) minutes x 3 doses as needed for chest pain (if no relief after 3rd dose proceed to ED or call 911). 11/06/2022-New   pantoprazole  40 MG tablet Commonly known as: PROTONIX  Take 40 mg by mouth daily.   Pharmacist Choice Alcohol Pads Apply topically.   polyethylene glycol 17 g packet Commonly known as: MIRALAX  / GLYCOLAX  Take 17 g by mouth 2 (two) times daily.   promethazine  25 MG tablet Commonly known as: PHENERGAN  Take 1 tablet (25 mg total) by mouth every 6 (six) hours as needed for nausea or vomiting.   senna-docusate 8.6-50 MG tablet Commonly known as: Senokot-S Take 1 tablet by mouth 2 (two) times daily.   torsemide  20 MG tablet Commonly known as: DEMADEX  Take 4 tablets (80 mg total) by mouth 2 (two) times daily.   Tresiba  FlexTouch 100 UNIT/ML FlexTouch Pen Generic drug: insulin  degludec Inject 6 Units into the skin at bedtime. What changed: how much  to take   ZzzQuil 25 MG Caps Generic drug: diphenhydrAMINE  HCl (Sleep) Take 25 mg by mouth at bedtime as needed (Sleep).               Durable Medical Equipment  (From admission, onward)           Start     Ordered   11/27/23 1329  For home use only DME oxygen   Once       Question Answer Comment  Length of Need 6 Months   Mode or (Route) Nasal cannula   Liters per Minute 2   Frequency Continuous (stationary and portable oxygen  unit needed)   Oxygen  conserving device Yes   Oxygen  delivery system Gas      11/27/23 1328            Follow-up Information     Harden Jerona GAILS, MD Follow up in 2 week(s).   Specialty: Orthopedic Surgery Contact information: [address] [place] [address] [phone]         [name], NP Follow up.   Specialty: Nurse Practitioner Why: hospital follow up appt scheduled for 12/04/2023 at  1215 pm Contact information: [address] [place] [address] [phone]         [facility] Follow up on 12/16/2023.   Specialty: Cardiology Why: Follow up in [facility] 8/18 at 2 pm Contact information: [address] [place]  [phone]               Discharge Exam: Filed Weights   12/04/23 0600 12/05/23 0420 12/06/23 0326  Weight: 84.3 kg 84 kg 83.7 kg   BP 112/68 (BP Location: Right Arm)   Pulse 82   Temp 97.8 F (36.6 C) (Oral)   Resp 20   Ht 5' 4 (1.626 m)   Wt 83.7 kg   SpO2 94%   BMI 31.67 kg/m   Patient is feeling better no without foley catheter, has on dysuria or difficulty voiding, no hematuria, no chest pain and no dyspnea, no edema, PND or orthopnea.   Neurology awake and alert ENT with mild pallor Cardiovascular with S1 and S2 present and regular, with no gallops or rubs, positive systolic murmur at the base.  Respiratory with no rales or wheezing, no rhonchi  Abdomen with no distention  Left BKA and right foot transmetatarsal amputation.  No peripheral edema        Condition at discharge: stable  The results of significant diagnostics from this hospitalization (including imaging, microbiology, ancillary and laboratory) are listed below for reference.   Imaging Studies: DG Chest Port 1 View Result Date: 12/03/2023 CLINICAL DATA:  Pulmonary edema EXAM: PORTABLE CHEST 1 VIEW COMPARISON:  Chest radiograph dated 11/30/2023 FINDINGS: Lines/tubes: Interval extubation and removal of enteric tube. Left internal jugular venous catheter tip projects over the confluence of brachiocephalic veins and SVC. Lungs: Low lung volumes. Similar diffuse interstitial and bibasilar patchy opacities. Pleura: Similar small to moderate bilateral pleural effusions. No pneumothorax. Heart/mediastinum: Similar enlarged cardiomediastinal silhouette. Bones: No acute osseous  abnormality. IMPRESSION: 1. Interval extubation and removal of enteric tube. 2. Similar diffuse interstitial and bibasilar patchy opacities, likely pulmonary edema. 3. Similar small to moderate bilateral pleural effusions. Electronically Signed   By: Limin  Xu M.D.   On: 12/03/2023 11:27   DG Abd 1 View Result Date: 12/01/2023 CLINICAL DATA:  [id number] Encounter for orogastric (OG) tube placement [id number] EXAM: ABDOMEN -  1 VIEW COMPARISON:  Chest x-ray 11/30/2023 FINDINGS: Partially visualized central venous catheter and endotracheal tubes. Enteric tube courses below the hemidiaphragm with tip collimated off view and side port overlying the expected region of the gastric lumen. The bowel gas pattern is normal. No radio-opaque calculi or other significant radiographic abnormality are seen. Vascular calcifications. Partially visualized lungs demonstrate persistent interstitial/airspace opacities and likely bilateral trace pleural effusions. IMPRESSION: 1. Enteric tube in good position.  Could be retracted by 8 cm. 2. Other lines and tubes only partially visualized and collimated off view. 3. Partially visualized lungs demonstrate persistent interstitial/airspace opacities and likely bilateral trace pleural effusions. Electronically Signed   By: Morgane  Naveau M.D.   On: 12/01/2023 12:52   DG Abd 1 View Result Date: 11/30/2023 CLINICAL DATA:  Nasogastric tube placement. EXAM: ABDOMEN - 1 VIEW COMPARISON:  11/19/2023 FINDINGS: Nasogastric tube is present with tip and side-port over the stomach in the left mid to upper abdomen. Bowel gas pattern is nonobstructive. There is a paucity bowel gas visualized. No free peritoneal air. Remainder of the exam is unchanged. IMPRESSION: 1. Nonobstructive bowel gas pattern. 2. Nasogastric tube with tip and side-port over the stomach in the left mid to upper abdomen. Electronically Signed   By: Toribio Agreste M.D.   On: 11/30/2023 17:30   DG CHEST PORT 1 VIEW Result Date:  11/30/2023 CLINICAL DATA:  Central line placement EXAM: PORTABLE CHEST 1 VIEW COMPARISON:  11/28/2023 FINDINGS: Single frontal view of the chest demonstrates endotracheal tube overlying tracheal air column, tip approximately 3 cm above carina. Enteric catheter passes below diaphragm, tip excluded by collimation. Left internal jugular catheter tip overlies the SVC. Cardiac silhouette is stable. Widespread interstitial and ground-glass opacities are again seen throughout the lungs. Trace bilateral effusions again noted. No pneumothorax. IMPRESSION: 1. Support devices as above. 2. Multifocal bilateral airspace disease and trace bilateral effusions, without significant change. Electronically Signed   By: Ozell Daring M.D.   On: 11/30/2023 16:26   DG C-Arm 1-60 Min Result Date: 11/30/2023 CLINICAL DATA:  Cystoscopy EXAM: DG C-ARM 1-60 MIN COMPARISON:  None Available. FINDINGS: A single fluoroscopic image was obtained during the performance of the procedure and is provided for interpretation only. Image is obtained over the lower pelvis, with no gross radiographic abnormality identified. Please refer to the operative report. Fluoroscopy time: 23.6 seconds, 6.8 mGy IMPRESSION: 1. Intraoperative evaluation as above. Please refer to the operative report. Electronically Signed   By: Ozell Daring M.D.   On: 11/30/2023 15:17   DG Pelvis Portable Result Date: 11/30/2023 CLINICAL DATA:  [id number] Surgery, elective [id number]. EXAM: PORTABLE PELVIS 1-2 VIEWS COMPARISON:  Radiograph from earlier the same day. FINDINGS: Interval disappearance of previously seen semi circular thin metallic foreign body. No other radiopaque foreign body seen. IMPRESSION: *Interval disappearance of previously seen semi circular thin metallic foreign body. No other radiopaque foreign body. Critical Value/emergent results were called by telephone at the time of interpretation on 11/30/2023 at 2:28 pm to provider ADINE MANLY , who verbally  acknowledged these results. Electronically Signed   By: Ree Molt M.D.   On: 11/30/2023 14:29   DG Pelvis 1-2 Views Result Date: 11/30/2023 CLINICAL DATA:  [id number] Surgery, elective [id number]. EXAM: PELVIS - 1-2 VIEW COMPARISON:  None Available. FINDINGS: The provided image demonstrates An approximately 5-6 mm long half circular, thin metallic foreign body overlying the right side of the pubis near the pubic symphysis. No other radiopaque foreign bodies. Vascular stent noted overlying the left  superior thigh region. IMPRESSION: *There is a single metallic foreign body overlying the right side of the pubis. Critical Value/emergent results were called by telephone at the time of interpretation on 11/30/2023 at 1:47 pm to provider ADINE MANLY , who verbally acknowledged these results. Electronically Signed   By: Ree Molt M.D.   On: 11/30/2023 13:48   CARDIAC CATHETERIZATION Result Date: 11/29/2023 1. Severely elevated PCWP with prominent v-waves. 2. Mildly elevated right heart filling pressures. 3. Moderate pulmonary arterial hypertension 4.  Preserved cardiac output.   US  RENAL Result Date: 11/29/2023 CLINICAL DATA:  58 year old female with congestive heart failure. EXAM: RENAL / URINARY TRACT ULTRASOUND COMPLETE COMPARISON:  CT Abdomen and Pelvis 03/22/2023. FINDINGS: Right Kidney: Renal measurements: 10.8 x 4.4 x 4.2 cm = volume: 104 mL. No hydronephrosis or right renal mass. Cortical echogenicity is normal. Left Kidney: Renal measurements: 10.2 x 5.1 x 3.8 cm = volume: 102 mL. No left hydronephrosis or renal mass. Normal left renal cortical echogenicity. Bladder: Foley catheter balloon and irregular echogenic debris visible in the urinary bladder (image 49). Other: None. IMPRESSION: 1. Negative ultrasound appearance of both kidneys. 2. Foley catheter in place with irregular echogenic debris within the bladder. Correlate with urinalysis. Electronically Signed   By: VEAR Hurst M.D.   On: 11/29/2023  06:28   DG CHEST PORT 1 VIEW Result Date: 11/28/2023 CLINICAL DATA:  Shortness of breath EXAM: PORTABLE CHEST 1 VIEW COMPARISON:  Chest x-ray 11/28/2023 FINDINGS: Diffuse patchy multifocal airspace disease throughout both lungs has significantly increased. This spares the lung apices. There is no pleural effusion or pneumothorax. The cardiomediastinal silhouette is within normal limits. No acute fractures are seen. IMPRESSION: Increasing diffuse patchy multifocal airspace disease throughout both lungs. Electronically Signed   By: Greig Pique M.D.   On: 11/28/2023 21:25   DG CHEST PORT 1 VIEW Result Date: 11/28/2023 CLINICAL DATA:  [id number] Dyspnea [id number] EXAM: PORTABLE CHEST - 1 VIEW COMPARISON:  Multiple, most recently November 17, 2023 FINDINGS: Interval removal of the endotracheal and esophagogastric tubes and the central venous catheters. Diffuse hazy airspace disease throughout both lungs. Small right pleural effusion. No pneumothorax. Decreased consolidation in the left lung. Mild cardiomegaly. No acute fracture or destructive lesion. IMPRESSION: 1. Similar diffuse hazy airspace disease throughout both lungs with decreasing consolidation in the left lung. Trace right pleural effusion. 2. Interval removal of the support tubes and lines. Electronically Signed   By: Rogelia Myers M.D.   On: 11/28/2023 12:57   ECHOCARDIOGRAM LIMITED Result Date: 11/20/2023    ECHOCARDIOGRAM LIMITED REPORT   Patient Name:   MAZELLE HUEBERT [facility] Date of Exam: 11/20/2023 Medical Rec #:  [id number]              Height:       64.0 in Accession #:    [id number]             Weight:       195.1 lb Date of Birth:  1965-11-15               BSA:          1.936 m Patient Age:    57 years               BP:           102/66 mmHg Patient Gender: F                      HR:  72 bpm. Exam Location:  Inpatient Procedure: Limited Echo, Limited Color Doppler and Intracardiac Opacification            Agent (Both Spectral and Color Flow  Doppler were utilized during            procedure). Indications:    CHF I50.9  History:        Patient has prior history of Echocardiogram examinations, most                 recent 11/15/2023. CHF, Previous Myocardial Infarction and CAD,                 Signs/Symptoms:Chest Pain; Risk Factors:Hypertension, Diabetes                 and Dyslipidemia.  Sonographer:    Thea Norlander RCS Sonographer#2:  Damien Senior RDCS Referring Phys: [id number] [place] IMPRESSIONS  1. Left ventricular ejection fraction, by estimation, is 25 to 30%. The left ventricle has severely decreased function. The left ventricle demonstrates global hypokinesis. The left ventricular internal cavity size was mildly dilated. Left ventricular diastolic parameters are consistent with Grade II diastolic dysfunction (pseudonormalization). Elevated left atrial pressure.  2. The mitral valve is abnormal. Mild to moderate mitral valve regurgitation. Moderate mitral annular calcification.  3. There is moderate calcification of the aortic valve. There is moderate thickening of the aortic valve. Moderate to severe aortic valve stenosis. Aortic valve Vmax measures 3.21 m/s.  4. The inferior vena cava is dilated in size with <50% respiratory variability, suggesting right atrial pressure of 15 mmHg. FINDINGS  Left Ventricle: Left ventricular ejection fraction, by estimation, is 25 to 30%. The left ventricle has severely decreased function. The left ventricle demonstrates global hypokinesis. Definity  contrast agent was given IV to delineate the left ventricular endocardial borders. The left ventricular internal cavity size was mildly dilated. Left ventricular diastolic parameters are consistent with Grade II diastolic dysfunction (pseudonormalization). Elevated left atrial pressure. Mitral Valve: The mitral valve is abnormal. Moderate mitral annular calcification. Mild to moderate mitral valve regurgitation. Tricuspid Valve: The tricuspid valve is normal in  structure. Tricuspid valve regurgitation is trivial. Aortic Valve: Suspect primarily moderate AS, Vmax 3.70m/s with DVI 0.26. There is moderate calcification of the aortic valve. There is moderate thickening of the aortic valve. Moderate to severe aortic stenosis is present. Aortic valve mean gradient measures 22.0 mmHg. Aortic valve peak gradient measures 41.2 mmHg. Aortic valve area, by VTI measures 0.82 cm. Pulmonic Valve: The pulmonic valve was normal in structure. Pulmonic valve regurgitation is trivial. Venous: The inferior vena cava is dilated in size with less than 50% respiratory variability, suggesting right atrial pressure of 15 mmHg. LEFT VENTRICLE PLAX 2D LVIDd:         5.60 cm   Diastology LVIDs:         5.00 cm   LV e' medial:    6.64 cm/s LV PW:         0.90 cm   LV E/e' medial:  21.4 LV IVS:        0.80 cm   LV e' lateral:   8.05 cm/s LVOT diam:     1.90 cm   LV E/e' lateral: 17.6 LV SV:         39 LV SV Index:   20 LVOT Area:     2.84 cm  RIGHT VENTRICLE         IVC TAPSE (M-mode): 2.0 cm  IVC diam: 2.70 cm LEFT  ATRIUM         Index LA diam:    4.10 cm 2.12 cm/m  AORTIC VALVE AV Area (Vmax):    0.74 cm AV Area (Vmean):   0.96 cm AV Area (VTI):     0.82 cm AV Vmax:           321.00 cm/s AV Vmean:          185.500 cm/s AV VTI:            0.480 m AV Peak Grad:      41.2 mmHg AV Mean Grad:      22.0 mmHg LVOT Vmax:         83.60 cm/s LVOT Vmean:        63.000 cm/s LVOT VTI:          0.138 m LVOT/AV VTI ratio: 0.29  AORTA Ao Root diam: 2.90 cm MITRAL VALVE MV Area (PHT): 4.49 cm     SHUNTS MV Decel Time: 169 msec     Systemic VTI:  0.14 m MV E velocity: 142.00 cm/s  Systemic Diam: 1.90 cm MV A velocity: 84.20 cm/s MV E/A ratio:  1.69 Morene Brownie Electronically signed by Morene Brownie Signature Date/Time: 11/20/2023/5:15:20 PM    Final    DG Abd 1 View Result Date: 11/19/2023 CLINICAL DATA:  Diarrhea. EXAM: ABDOMEN - 1 VIEW COMPARISON:  11/14/2023. FINDINGS: Nonobstructive bowel gas  pattern. Scattered gas and stool in the colon. No acute osseous abnormality. IMPRESSION: Nonobstructive bowel gas pattern. Scattered gas and stool in the colon. Electronically Signed   By: Harrietta Sherry M.D.   On: 11/19/2023 12:38   DG CHEST PORT 1 VIEW Result Date: 11/17/2023 CLINICAL DATA:  [id number] Acute exacerbation of CHF (congestive heart failure) (HCC) [id number] EXAM: PORTABLE CHEST - 1 VIEW COMPARISON:  11/16/2023 FINDINGS: Endotracheal tube, gastric tube, right IJ and left IJ central venous catheters stable in position. Diffuse small heterogenous opacities throughout both lungs perhaps marginally improved since previous exam. Heart size and mediastinal contours are within normal limits. Aortic Atherosclerosis (ICD10-170.0). Suspect small right pleural effusion. Visualized bones unremarkable. IMPRESSION: 1. Marginally improved diffuse bilateral opacities. 2. Small right pleural effusion. Electronically Signed   By: JONETTA Faes M.D.   On: 11/17/2023 10:32   DG CHEST PORT 1 VIEW Result Date: 11/16/2023 CLINICAL DATA:  Line placement EXAM: PORTABLE CHEST 1 VIEW COMPARISON:  X-ray 11/15/2023 FINDINGS: New right IJ catheter in place with tip along the central SVC near the right atrium. ET tube, enteric tube, left IJ catheter and left-sided PICC again noted. The PICC has once again the tip folded back on itself. Possibly along the azygous. This could be repositioned. Underinflation. Stable cardiopericardial silhouette with diffuse parenchymal and interstitial opacities. Small right effusion. No pneumothorax. Overlapping cardiac leads. IMPRESSION: New right IJ catheter in place.  No pneumothorax. Left-sided PICC has catheter tip folded at the tip and could be within the azygous. Recommend repositioning. Electronically Signed   By: Ranell Bring M.D.   On: 11/16/2023 12:13   ECHOCARDIOGRAM COMPLETE Result Date: 11/15/2023    ECHOCARDIOGRAM REPORT   Patient Name:   BERNADETT MILIAN [facility] Date of Exam: 11/15/2023  Medical Rec #:  [id number]              Height:       64.0 in Accession #:    [id number]             Weight:       203.9 lb Date  of Birth:  1965-07-29               BSA:          1.973 m Patient Age:    57 years               BP:           106/64 mmHg Patient Gender: F                      HR:           125 bpm. Exam Location:  Inpatient Procedure: 2D Echo, Cardiac Doppler, Color Doppler and Intracardiac            Opacification Agent (Both Spectral and Color Flow Doppler were            utilized during procedure). Indications:    CHF-Acute Diastolic I50.31  History:        Patient has prior history of Echocardiogram examinations, most                 recent 10/24/2023. CHF, CAD and Previous Myocardial Infarction;                 Risk Factors:Hypertension and Diabetes.  Sonographer:    Jayson Gaskins Referring Phys: [phone] LINDSAY NICOLE FINCH IMPRESSIONS  1. Extremely poor acoustic windows Definity  used. Severe hypokinesis of the septum, apex, inferior, distal anterior walls . Left ventricular ejection fraction, by estimation, is 25%. The left ventricle has severely decreased function. There is mild left  ventricular hypertrophy.  2. Right ventricular systolic function is normal. The right ventricular size is normal.  3. Mild to moderate mitral valve regurgitation. Moderate mitral annular calcification.  4. AV is thickened, calcified Peak and mean gradients through the valve are 28 and 18 mm Hg respectivel AVA (VTI) is 1 cm2. Dimensionless valve index is 0.43 consistent with mild AS.SABRA Aortic valve regurgitation is mild.  5. The inferior vena cava is normal in size with <50% respiratory variability, suggesting right atrial pressure of 8 mmHg. FINDINGS  Left Ventricle: Extremely poor acoustic windows Definity  used. Severe hypokinesis of the septum, apex, inferior, distal anterior walls. Left ventricular ejection fraction, by estimation, is 25%. The left ventricle has severely decreased function. The left ventricular  internal cavity size was normal in size. There is mild left ventricular hypertrophy. Right Ventricle: The right ventricular size is normal. Right vetricular wall thickness was not assessed. Right ventricular systolic function is normal. Left Atrium: Left atrial size was normal in size. Right Atrium: Right atrial size was normal in size. Pericardium: Trivial pericardial effusion is present. Mitral Valve: There is mild thickening of the mitral valve leaflet(s). Moderate mitral annular calcification. Mild to moderate mitral valve regurgitation. Tricuspid Valve: The tricuspid valve is grossly normal. Tricuspid valve regurgitation is trivial. Aortic Valve: AV is thickened, calcified Peak and mean gradients through the valve are 28 and 18 mm Hg respectivel AVA (VTI) is 1 cm2. Dimensionless valve index is 0.43 consistent with mild AS. Aortic valve regurgitation is mild. Aortic valve mean gradient measures 18.0 mmHg. Aortic valve peak gradient measures 28.3 mmHg. Aortic valve area, by VTI measures 0.97 cm. Pulmonic Valve: The pulmonic valve was not well visualized. Pulmonic valve regurgitation is trivial. No evidence of pulmonic stenosis. Aorta: The aortic root is normal in size and structure. Venous: The inferior vena cava is normal in size with less than 50% respiratory variability, suggesting right atrial pressure of 8 mmHg.  IAS/Shunts: No atrial level shunt detected by color flow Doppler.  LEFT VENTRICLE PLAX 2D LVIDd:         5.50 cm   Diastology LVIDs:         4.40 cm   LV e' medial:    6.20 cm/s LV PW:         1.20 cm   LV E/e' medial:  22.1 LV IVS:        1.10 cm   LV e' lateral:   6.96 cm/s LVOT diam:     1.70 cm   LV E/e' lateral: 19.7 LV SV:         40 LV SV Index:   20 LVOT Area:     2.27 cm  LEFT ATRIUM             Index LA Vol (A2C):   51.8 ml 26.26 ml/m LA Vol (A4C):   48.8 ml 24.74 ml/m LA Biplane Vol: 52.8 ml 26.77 ml/m  AORTIC VALVE AV Area (Vmax):    0.99 cm AV Area (Vmean):   0.89 cm AV Area  (VTI):     0.97 cm AV Vmax:           266.00 cm/s AV Vmean:          206.000 cm/s AV VTI:            0.418 m AV Peak Grad:      28.3 mmHg AV Mean Grad:      18.0 mmHg LVOT Vmax:         116.00 cm/s LVOT Vmean:        80.700 cm/s LVOT VTI:          0.178 m LVOT/AV VTI ratio: 0.43  AORTA Ao Root diam: 2.50 cm MITRAL VALVE MV Area (PHT): 6.54 cm     SHUNTS MV Decel Time: 116 msec     Systemic VTI:  0.18 m MV E velocity: 137.00 cm/s  Systemic Diam: 1.70 cm MV A velocity: 126.00 cm/s MV E/A ratio:  1.09 Vina Gull MD Electronically signed by Vina Gull MD Signature Date/Time: 11/15/2023/7:55:06 PM    Final    DG Chest Port 1 View Result Date: 11/15/2023 CLINICAL DATA:  Endotracheal tube. Acute on chronic diastolic congestive heart failure. EXAM: PORTABLE CHEST 1 VIEW COMPARISON:  November 14, 2023. FINDINGS: Endotracheal and nasogastric tubes are in grossly good position. Left internal jugular catheter and left-sided PICC line are unchanged. Mildly increased diffuse lung opacities are noted concerning for worsening edema or pneumonia with associated right pleural effusion. Bony thorax is unremarkable. IMPRESSION: Stable support apparatus.  Increased lung opacities as noted above. Electronically Signed   By: Lynwood Landy Raddle M.D.   On: 11/15/2023 08:31   DG CHEST PORT 1 VIEW Result Date: 11/14/2023 CLINICAL DATA:  Endotracheal tube placement. EXAM: PORTABLE CHEST 1 VIEW COMPARISON:  Same day. FINDINGS: Endotracheal tube in grossly good position. Nasogastric tube is seen entering stomach. Left internal jugular catheter is noted with distal tip in expected position of right atrium. Left-sided PICC line is noted with distal tip looped into SVC. Stable bilateral lung opacities are noted concerning for edema or pneumonia with associated effusions. IMPRESSION: Endotracheal tube in grossly good position. Left-sided venous catheters as noted above. Continued bilateral lung opacities as noted above. Electronically Signed   By:  Lynwood Landy Raddle M.D.   On: 11/14/2023 17:48   DG Abd 1 View Result Date: 11/14/2023 CLINICAL DATA:  Endotracheal tube placement. EXAM:  ABDOMEN - 1 VIEW COMPARISON:  Same day. FINDINGS: Endotracheal tube is in grossly good position. Nasogastric tube is seen entering stomach with distal tip in expected position of distal stomach. Left-sided PICC line is noted with distal tip in expected position of SVC. Left internal jugular catheter is noted with distal tip in expected position of right atrium. Diffuse bilateral lung opacities are noted concerning for edema or pneumonia with small pleural effusions. IMPRESSION: Endotracheal tube is in grossly good position. Nasogastric tube tip seen in expected position of distal stomach. Left-sided PICC line and left internal jugular catheter as noted above. Electronically Signed   By: Lynwood Landy Raddle M.D.   On: 11/14/2023 17:47   ABORTED INVASIVE LAB PROCEDURE Result Date: 11/14/2023 Patient unable to get on the cath table due to severe dyspnea/orthopnea. Returned to unit for further evaluation and treatment.   DG CHEST PORT 1 VIEW Result Date: 11/14/2023 CLINICAL DATA:  Hypoxia EXAM: PORTABLE CHEST 1 VIEW COMPARISON:  11/11/2023 FINDINGS: Prominent and worsening bilateral diffuse airspace opacities in the lungs with obscuration of the cardiac margins and diaphragms. Atherosclerotic calcification of the aortic arch. Lower thoracic spondylosis. Degenerative AC joint arthropathy on the left. IMPRESSION: 1. Prominent and worsening bilateral diffuse airspace opacities in the lungs, with obscuration of the cardiac margins and diaphragms. This could reflect diffuse pulmonary edema or diffuse pneumonia. 2. Aortic Atherosclerosis (ICD10-I70.0). Electronically Signed   By: Ryan Salvage M.D.   On: 11/14/2023 13:07   DG Chest 2 View Result Date: 11/11/2023 CLINICAL DATA:  Shortness of breath. EXAM: CHEST - 2 VIEW COMPARISON:  November 07, 2023. FINDINGS: Stable cardiomediastinal  silhouette. Left-sided PICC line is noted with distal tip looped within SVC. Increased bilateral patchy and interstitial airspace opacities are noted suggesting worsening pneumonia or pulmonary edema. Small pleural effusions are noted. Bony thorax is unremarkable. IMPRESSION: Significantly increased bilateral lung opacities are noted suggesting worsening pneumonia or edema with small pleural effusions. Electronically Signed   By: Lynwood Landy Raddle M.D.   On: 11/11/2023 15:08   VAS US  LOWER EXTREMITY VENOUS (DVT) Result Date: 11/08/2023  Lower Venous DVT Study Patient Name:  ALENA BLANKENBECKLER [facility]  Date of Exam:   11/08/2023 Medical Rec #: [id number]               Accession #:    [id number] Date of Birth: 09/18/1965                Patient Gender: F Patient Age:   54 years Exam Location:  [facility] Procedure:      VAS US  LOWER EXTREMITY VENOUS (DVT) Referring Phys: PAMELA LOVE --------------------------------------------------------------------------------  Indications: Edema.  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 Limitations: Poor ultrasound/tissue interface and Edema, acoustic shadowing secondary to plaque. Comparison Study: Prior negative right LEV done 11/03/23 Performing Technologist: Alberta Lis RVS  Examination Guidelines: A complete evaluation includes B-mode imaging, spectral Doppler, color Doppler, and power Doppler as needed of all accessible portions of each vessel. Bilateral testing is considered an integral part of a complete examination. Limited examinations for reoccurring indications may be performed as noted. The reflux portion of the exam is performed with the patient in reverse Trendelenburg.  +---------+---------------+---------+-----------+----------+-------------------+ RIGHT    CompressibilityPhasicitySpontaneityPropertiesThrombus Aging      +---------+---------------+---------+-----------+----------+-------------------+ CFV       Full           Yes      Yes                                      +---------+---------------+---------+-----------+----------+-------------------+  SFJ      Full           Yes      Yes                                      +---------+---------------+---------+-----------+----------+-------------------+ FV Prox  Full                                                             +---------+---------------+---------+-----------+----------+-------------------+ FV Mid   Full           Yes      Yes                                      +---------+---------------+---------+-----------+----------+-------------------+ FV DistalFull           Yes      Yes                                      +---------+---------------+---------+-----------+----------+-------------------+ PFV      Full                                                             +---------+---------------+---------+-----------+----------+-------------------+ POP      Full           Yes      Yes                                      +---------+---------------+---------+-----------+----------+-------------------+ PTV      Full                                                             +---------+---------------+---------+-----------+----------+-------------------+ PERO                                                  Not well visualized +---------+---------------+---------+-----------+----------+-------------------+ Gastroc  Full                                                             +---------+---------------+---------+-----------+----------+-------------------+ SSV      Full                                                             +---------+---------------+---------+-----------+----------+-------------------+  Summary: RIGHT: - Findings appear essentially unchanged compared to previous examination. - There is no evidence of deep vein thrombosis in the lower extremity. However,  portions of this examination were limited- see technologist comments above.  - No cystic structure found in the popliteal fossa. - Ultrasound characteristics of enlarged lymph nodes are noted in the groin.  LEFT: - No evidence of common femoral vein obstruction.  - Ultrasound characteristics of enlarged lymph nodes noted in the groin.  *See table(s) above for measurements and observations. Electronically signed by Gaile New MD on 11/08/2023 at 5:18:03 PM.    Final    DG Chest 2 View Result Date: 11/07/2023 CLINICAL DATA:  [id number] Dyspnea on exertion [id number] EXAM: CHEST - 2 VIEW COMPARISON:  11/05/2023. FINDINGS: Redemonstration of diffuse alveolar and interstitial opacities throughout bilateral lungs, overall slightly improved since the prior study. Findings are nonspecific and differential diagnosis includes pulmonary edema, ARDS or multilobar pneumonia. There is subtle blunting of bilateral posterior costophrenic angles, favoring bilateral trace pleural effusions. No pneumothorax. Stable cardio-mediastinal silhouette. No acute osseous abnormalities. The soft tissues are within normal limits. IMPRESSION: *Redemonstration of diffuse alveolar and interstitial opacities throughout bilateral lungs, overall slightly improved since the prior study. Findings are nonspecific and differential diagnosis includes pulmonary edema, ARDS or multilobar pneumonia. Electronically Signed   By: Ree Molt M.D.   On: 11/07/2023 17:28    Microbiology: Results for orders placed or performed during the hospital encounter of 11/12/23  MRSA Next Gen by PCR, Nasal     Status: None   Collection Time: 11/14/23  6:11 PM   Specimen: Nasal Mucosa; Nasal Swab  Result Value Ref Range Status   MRSA by PCR Next Gen NOT DETECTED NOT DETECTED Final    Comment: (NOTE) The GeneXpert MRSA Assay (FDA approved for NASAL specimens only), is one component of a comprehensive MRSA colonization surveillance program. It is not intended to  diagnose MRSA infection nor to guide or monitor treatment for MRSA infections. Test performance is not FDA approved in patients less than 62 years old. Performed at [facility], [address], [place], [place] [address]   Culture, Respiratory w Gram Stain     Status: None   Collection Time: 11/15/23  3:25 PM   Specimen: Tracheal Aspirate; Respiratory  Result Value Ref Range Status   Specimen Description TRACHEAL ASPIRATE  Final   Special Requests NONE  Final   Gram Stain   Final    ABUNDANT WBC PRESENT,BOTH PMN AND MONONUCLEAR NO ORGANISMS SEEN    Culture   Final    RARE Normal respiratory flora-no Staph aureus or Pseudomonas seen Performed at [facility], [address] [place], [place] [address]    Report Status 11/17/2023 FINAL  Final    Labs: CBC: Recent Labs  Lab 12/01/23 0410 12/02/23 0353 12/03/23 0502 12/04/23 0420 12/05/23 0335  WBC 13.9* 11.5* 8.2 9.7 8.7  HGB 8.7* 8.7* 8.4* 8.4* 8.3*  HCT 28.2* 28.6* 28.5* 27.9* 27.0*  MCV 96.9 99.0 100.0 99.3 97.1  PLT 248 233 225 238 262   Basic Metabolic Panel: Recent Labs  Lab 12/02/23 0353 12/02/23 1212 12/03/23 0502 12/03/23 1425 12/04/23 0420 12/05/23 0335  NA 142  --  142 139 136 135  K 3.2* 3.4* 3.4* 4.1 3.9 3.7  CL 99  --  100 96* 96* 93*  CO2 26  --  28 28 28 30   GLUCOSE 122*  --  116* 207* 175* 233*  BUN 110*  --  94* 88* 89* 86*  CREATININE 1.97*  --  1.68* 1.64* 1.50* 1.60*  CALCIUM  8.8*  --  9.0 8.7* 8.8* 8.8*  MG 2.3  --   --   --   --   --   PHOS 5.7*  --   --   --   --   --    Liver Function Tests: Recent Labs  Lab 11/30/23 0739  AST 25  ALT 19  ALKPHOS 80  BILITOT 1.2  PROT 7.8  ALBUMIN  3.2*   CBG: Recent Labs  Lab 12/05/23 0601 12/05/23 1108 12/05/23 1610 12/05/23 2051 12/06/23 0604  GLUCAP 239* 241* 204* 214* 174*    Discharge time spent: greater than 30 minutes.  Signed: Elidia Toribio Furnace, MD [facility] 12/06/2023

## 2023-12-06 NOTE — Progress Notes (Signed)
 Reviewed AVS, patient expressed understanding of medications, MD follow up reviewed.   Removed IV, Site clean, dry and intact.  See LDA for information on wounds at discharge. Patient states all belongings brought to the hospital at time of admission are accounted for and packed to take home.  Picked up medications from [facility] pharmacy. Transported patient to entrance A where family member was waiting in vehicle to transport home.

## 2023-12-06 NOTE — Progress Notes (Addendum)
 Patient ID: Kayla Schmitt, female   DOB: Apr 13, 1966, 58 y.o.   MRN: [id number]     Advanced Heart Failure Rounding Note  Cardiologist: Vishnu SHAUNNA Maywood, MD  AHF Consulting MD: Dr. Cherrie  Chief Complaint: Acute on chronic HFpEF / RV Failure  Patient Profile   58 y.o. female with history of PAD with prior SFA stents and bilateral TMA, severe 3v CAD, MR/AS, DM II, anemia, HTN, HLD, hypothyroidism.  Admitted in 6/25 with MRSA bactermia and necrotizing fasciitis s/p left BKA. Transferred back to [facility] from rehab d/t acute on chronic CHF with concern for low-output and acute respiratory failure.  Subjective:    7/18: aborted cath for acute respiratory distress; intubated; DBA started 7/19: started on CRRT 7/20: extubated 7/21: hgb 6.5 s/p 1u RBCs 7/23: CRRT stopped 8/1: RHC (see below).  8/2: To OR for cystoscopy and declotting of bladder.  Bladder filled with clot, no obvious bladder lesion.  8/4 extubated 8/6: transferred out of ICU   Resting comfortably in bed. Wants to be discharged today.   Objective  1.1L in UOP yesterday w/ PO torsemide . SCr 1.5>>1.6>>none  CVP 7-8  SBPs low 100s on midodrine    Weight Range: 83.7 kg Body mass index is 31.67 kg/m.   Vital Signs:   Temp:  [97.7 F (36.5 C)-98.4 F (36.9 C)] 97.7 F (36.5 C) (08/08 0326) Pulse Rate:  [80-87] 82 (08/08 0326) Resp:  [18-20] 19 (08/08 0326) BP: (101-112)/(60-72) 107/65 (08/08 0326) SpO2:  [91 %-100 %] 92 % (08/08 0326) Weight:  [83.7 kg] 83.7 kg (08/08 0326) Last BM Date : 12/05/23  Weight change: Filed Weights   12/04/23 0600 12/05/23 0420 12/06/23 0326  Weight: 84.3 kg 84 kg 83.7 kg   Intake/Output:  Intake/Output Summary (Last 24 hours) at 12/06/2023 0726 Last data filed at 12/06/2023 0329 Gross per 24 hour  Intake 777 ml  Output 1150 ml  Net -373 ml    Physical Exam   Vitals:   12/06/23 0020 12/06/23 0326  BP: 112/72 107/65  Pulse: 87 82  Resp: 18 19  Temp: 98.3 F (36.8  C) 97.7 F (36.5 C)  SpO2: 94% 92%   General:  frail appearing.  No respiratory difficulty Neck: JVD ~7 cm.  Cor: Regular rate & rhythm. 2/6 AS murmur. Lungs: clear Extremities: trace RLE edema. R TMA. L BKA.  Neuro: alert & oriented x 3. Affect pleasant.   Telemetry   NSR 70s, personally reviewed   Labs    CBC Recent Labs    12/04/23 0420 12/05/23 0335  WBC 9.7 8.7  HGB 8.4* 8.3*  HCT 27.9* 27.0*  MCV 99.3 97.1  PLT 238 262   Basic Metabolic Panel Recent Labs    91/93/74 0420 12/05/23 0335  NA 136 135  K 3.9 3.7  CL 96* 93*  CO2 28 30  GLUCOSE 175* 233*  BUN 89* 86*  CREATININE 1.50* 1.60*  CALCIUM  8.8* 8.8*   Liver Function Tests No results for input(s): AST, ALT, ALKPHOS, BILITOT, PROT, ALBUMIN  in the last 72 hours.   BNP (last 3 results) Recent Labs    11/07/23 1457 11/13/23 0521 11/29/23 0230  BNP 336.4* 1,626.5* 2,406.9*   Medications:    Scheduled Medications:  aspirin  EC  81 mg Oral Daily   atorvastatin   40 mg Oral Daily   bethanechol   10 mg Oral TID   Chlorhexidine  Gluconate Cloth  6 each Topical Daily   clopidogrel   75 mg Oral Daily   feeding supplement  237 mL Oral TID BM   ferrous sulfate   325 mg Oral Q breakfast   gabapentin   300 mg Oral QHS   Gerhardt's butt cream   Topical BID   heparin  injection (subcutaneous)  5,000 Units Subcutaneous Q8H   hydrocortisone  cream   Topical TID   insulin  aspart  0-15 Units Subcutaneous TID WC   insulin  aspart  2 Units Subcutaneous TID WC   insulin  glargine-yfgn  8 Units Subcutaneous BID   levothyroxine   150 mcg Oral Q0600   midodrine   5 mg Oral Q8H   multivitamin with minerals  1 tablet Oral Daily   pantoprazole   40 mg Oral Daily   sodium chloride  flush  10-40 mL Intracatheter Q12H   torsemide   80 mg Oral BID   Infusions:    PRN Medications: acetaminophen , Gerhardt's butt cream, ipratropium-albuterol , lidocaine , mouth rinse, mouth rinse, mouth rinse, oxyCODONE , polyethylene  glycol, prochlorperazine , senna-docusate, sodium chloride  flush  Assessment/Plan  Acute on chronic HFpEF>Mixed Shock; Cardiogenic/Septic - TEE 6/25: EF 60-65%, RV okay, mild to mod MR, moderate-severe AS with mG 27 mmHg and AVA 1.07 cm2 - TTE 11/15/23 LVEF 25% - Taken for [facility] 7/17, however developed flash pulmonary edema. Unable to lie flat for procedure.  - Repeat echo with EF 25-30%, G2DD, mild to mod MR, mod-severe AS  - GDMT limited by renal function and hypotension (SBP 80s-90s generally). On midodrine  5 mg tid with stable BP - RHC on 8/1 showed severely elevated PCWP, mildly elevated RAP, preserved CO.  - Well diuresed, CVP 7/8. Continue po Torsemide  80 BID.  - Continue midodrine  5 TID for BP support  - GDMT limited by AKI and low BP  2. Acute respiratory failure with hypoxia - Due to pulmonary edema - Extubated 08/04 - Weaning off supplemental O2  3. CAD - NSTEMI in 4/24. Multivessel CAD. CABG had been recommended but managed medically due to other active medical issues - Further ischemic evaluation pending stabilization of renal function => no cath with AKI. Likely will need to defer to outpatient follow-up. - continue aspirin  + statin   4. Valvular heart disease - Mild to moderate MR and moderate to severe AS on echo 6/25 - Repeat echo 7/25 with extremely poor windows, based on exam expect AS moderate-severe - Echo 11/20/23 with Vmax 3.21, DVT 0.26, mod to severe AS, mG 22 - AS appears severe on exan but she is not a TAVR or SAVR candidate currently with infectious issues. Would need to be infection free X 4 weeks prior to considering AoV intervention.    5. MRSA bacteremia - Necrotizing fasciitis - S/p recent left BKA 6/25; S/p R TMA in 4/24  6. UTI - Completed course of Ceftriaxone   7. PAD - hx SFA stents - On aspirin  + statin. Had been on plavix  but stopped d/t risk of bleeding.   8. Anemia - s/p 1 u RBC 7/14 - Hx GI bleed from AVMs.  - fecal occult negative -  No recurrent hematuria - Hgb stable 8.3 yesterday   9. AKI on CKD IIIa - In setting of volume overload/low-output.  - Started on CVVH 7/19, stopped 7/22.  - Some renal recovery and creatinine had been trending down, but worsened in setting bladder outlet obstruction from extensive clots in bladder.  Now s/p cystoscopy and clot removal, good UOP and renal function recovering  Would be stable for discharge today from AHF standpoint. Has f/u arranged in AHF clinic.   Beckey LITTIE Coe, AGACNP-BC  12/06/2023 7:26 AM  Patient seen with NP, I formulated the plan and agree with the above note.   Creatinine stable at 1.6, CVP is about 8.  No complaints.   General: NAD Neck: No JVD, no thyromegaly or thyroid nodule.  Lungs: Clear to auscultation bilaterally with normal respiratory effort. CV: Nondisplaced PMI.  Heart regular S1/S2, no S3/S4, 3/6 SEM RUSB.  No peripheral edema.    Abdomen: Soft, nontender, no hepatosplenomegaly, no distention.  Skin: Intact without lesions or rashes.  Neurologic: Alert and oriented x 3.  Psych: Normal affect. Extremities:R TMA, L BKA.  HEENT: Normal.   Volume looks stable and renal function is remaining stable at creatinine of 1.6.  I will accept CVP 10 or less (8 today), continue torsemide  80 mg bid. GDMT limited by low BP and renal dysfunction, she will continue midodrine .    She ultimately needs TAVR, will need followup in structural heart clinic.  Will need her to be out a few weeks from infectious event.    Hgb is remaining stable.     Home today.  Will need close followup in CHF clinic.   Ezra Shuck 12/06/2023 11:14 AM

## 2023-12-09 ENCOUNTER — Encounter (HOSPITAL_COMMUNITY)

## 2023-12-09 ENCOUNTER — Encounter: Payer: Self-pay | Admitting: Nurse Practitioner

## 2023-12-09 ENCOUNTER — Ambulatory Visit: Attending: Nurse Practitioner | Admitting: Nurse Practitioner

## 2023-12-09 ENCOUNTER — Telehealth: Payer: Self-pay

## 2023-12-09 VITALS — BP 118/64 | HR 71 | Ht 64.0 in | Wt 190.0 lb

## 2023-12-09 DIAGNOSIS — E1169 Type 2 diabetes mellitus with other specified complication: Secondary | ICD-10-CM

## 2023-12-09 DIAGNOSIS — I739 Peripheral vascular disease, unspecified: Secondary | ICD-10-CM

## 2023-12-09 DIAGNOSIS — I38 Endocarditis, valve unspecified: Secondary | ICD-10-CM | POA: Diagnosis not present

## 2023-12-09 DIAGNOSIS — I2510 Atherosclerotic heart disease of native coronary artery without angina pectoris: Secondary | ICD-10-CM

## 2023-12-09 DIAGNOSIS — I5033 Acute on chronic diastolic (congestive) heart failure: Secondary | ICD-10-CM

## 2023-12-09 DIAGNOSIS — D509 Iron deficiency anemia, unspecified: Secondary | ICD-10-CM

## 2023-12-09 DIAGNOSIS — I5020 Unspecified systolic (congestive) heart failure: Secondary | ICD-10-CM

## 2023-12-09 DIAGNOSIS — I350 Nonrheumatic aortic (valve) stenosis: Secondary | ICD-10-CM | POA: Diagnosis not present

## 2023-12-09 DIAGNOSIS — Z794 Long term (current) use of insulin: Secondary | ICD-10-CM

## 2023-12-09 DIAGNOSIS — Z8719 Personal history of other diseases of the digestive system: Secondary | ICD-10-CM

## 2023-12-09 DIAGNOSIS — E118 Type 2 diabetes mellitus with unspecified complications: Secondary | ICD-10-CM

## 2023-12-09 DIAGNOSIS — Z7902 Long term (current) use of antithrombotics/antiplatelets: Secondary | ICD-10-CM

## 2023-12-09 DIAGNOSIS — D649 Anemia, unspecified: Secondary | ICD-10-CM

## 2023-12-09 DIAGNOSIS — N289 Disorder of kidney and ureter, unspecified: Secondary | ICD-10-CM

## 2023-12-09 DIAGNOSIS — Z79899 Other long term (current) drug therapy: Secondary | ICD-10-CM

## 2023-12-09 MED ORDER — NITROGLYCERIN 0.4 MG SL SUBL: 0.4000 mg | SUBLINGUAL_TABLET | SUBLINGUAL | 3 refills | Status: AC | PRN

## 2023-12-09 NOTE — Progress Notes (Unsigned)
 Cardiology Office Note   Date:  12/09/2023  ID:  Evagelia, Knack 1966/04/20, MRN [id number] PCP: [name], NP  [facility] Providers Cardiologist:  Diannah SHAUNNA Maywood, MD { Click to update primary MD,subspecialty MD or APP then REFRESH:1}    History of Present Illness Kayla Schmitt is a 58 y.o. female with a PMH of CHF, CAD, moderate aortic valve stenosis, PAD, s/p right SFA mehanical orbital atherectomy and balloon angioplasty in 2021, s/p right SFA stent placement, hx of amputations bialterally transmetatarsal, HTN, HLD, and T2DM, history of GI bleed and acute on chronic anemia secondary to AVM.  Whp presents today for follow-up.   Previous hx of severe three-vessel disease and history of NSTEMI in April 2024.  Also history of right above-knee popliteal artery angioplasty.  Hospital admission in early December 2024 due to decompensated combined systolic and diastolic heart failure, IDA, and hypertension.  Treated with IV Lasix  and repeat echo revealed EF 45 to 50%, moderate MR and AAS.  Last seen by Lamarr Satterfield, NP-C on April 23, 2023.  She was feeling well at the time, reported having more energy.  Denied any issues with leg swelling.  She was hospitalized from end of June 2025 to early July 2025 due to necrotizing fasciitis, sepsis protocol was initiated and was treated with antibiotics.  Was transferred to [facility] for vascular surgery consult.  Hospital course noted by AKI, altered mental status and elevated troponin secondary demand ischemia.  She ended up receiving 1 unit of blood transfusion as hemoglobin dropped.  No GI bleed.  She underwent left BKA with wound VAC placement on June 24 by Dr. Fonda Simpers as well as left revision BKA on June 25 by Dr. Harden, orthopedic follow-up recommended..  TEE revealed normal LVEF with no evidence of infective endocarditis or vegetation.  PICC line was placed on June 28, recommend follow-up with ID  outpatient.  She underwent RHC on 11/29/2023.  Findings revealed severely elevated PCWP with prominent V waves and mildly elevated right heart filling pressures with moderate PAH and preserved cardiac output.  On August 2 she went to the OR for cystoscopy and declotting of bladder bladder was filled with clot, no obvious bladder lesion.  According to advanced heart failure specialist her aortic valve stenosis appeared severe on exam but was not found not to be a TAVR or SAVR candidate.  Stated she would need to be infection free for 4 weeks prior to considering aortic valve intervention.   Iron  panel.... B12.... folate...     ROS: ***  Studies Reviewed      *** Risk Assessment/Calculations {Does this patient have ATRIAL FIBRILLATION?[phone]} No BP recorded.  {Refresh Note OR Click here to enter BP  :1}***       Physical Exam VS:  There were no vitals taken for this visit.       Wt Readings from Last 3 Encounters:  12/06/23 184 lb 8.4 oz (83.7 kg)  11/12/23 203 lb 14.8 oz (92.5 kg)  10/28/23 231 lb 0.7 oz (104.8 kg)    GEN: Well nourished, well developed in no acute distress NECK: No JVD; No carotid bruits CARDIAC: ***RRR, no murmurs, rubs, gallops RESPIRATORY:  Clear to auscultation without rales, wheezing or rhonchi  ABDOMEN: Soft, non-tender, non-distended EXTREMITIES:  No edema; No deformity   ASSESSMENT AND PLAN ***    {Are you ordering a CV Procedure (e.g. stress test, cath, DCCV, TEE, etc)?   Press F2        :  [id number]}  Dispo: ***  Signed, Almarie Crate, NP

## 2023-12-09 NOTE — Telephone Encounter (Signed)
 Pt called to schedule a f/u after leaving the ER pt was advised that she previously had a surgery done by Dr. Roseann so she could f/u at his [place] office due to [facility] not having any current availability pt given [place] phone number and advised to contact their office

## 2023-12-09 NOTE — Patient Instructions (Addendum)
 Medication Instructions:   Continue all current medications.   Labwork:  CBC, Iron  Panel, Vit b12, Folate, BMET - orders given today Please do in 2 weeks at [facility] will contact with results via phone, letter or mychart.     Testing/Procedures:  none  Follow-Up:  Dr. Court as scheduled   Any Other Special Instructions Will Be Listed Below (If Applicable).   If you need a refill on your cardiac medications before your next appointment, please call your pharmacy.

## 2023-12-09 NOTE — Progress Notes (Signed)
 Cardiology Office Note   Date: 12/09/2023 ID:  Kayla Schmitt, Kayla Schmitt 1965/11/21, MRN [id number] PCP: [name], NP  [facility] Providers Cardiologist:  Diannah SHAUNNA Maywood, MD     History of Present Illness Kayla Schmitt is a 58 y.o. female with a PMH of CHF, CAD, moderate aortic valve stenosis, PAD, s/p right SFA mehanical orbital atherectomy and balloon angioplasty in 2021, s/p right SFA stent placement, hx of amputations bialterally transmetatarsal, HTN, HLD, and T2DM, history of GI bleed and acute on chronic anemia secondary to AVM.  Whp presents today for follow-up.   Previous hx of severe three-vessel disease and history of NSTEMI in April 2024.  Also history of right above-knee popliteal artery angioplasty.  Hospital admission in early December 2024 due to decompensated combined systolic and diastolic heart failure, IDA, and hypertension.  Treated with IV Lasix  and repeat echo revealed EF 45 to 50%, moderate MR and AAS.  Last seen by Lamarr Satterfield, NP-C on April 23, 2023.  She was feeling well at the time, reported having more energy.  Denied any issues with leg swelling.  She was hospitalized from end of June 2025 to early July 2025 due to necrotizing fasciitis, sepsis protocol was initiated and was treated with antibiotics.  Was transferred to [facility] for vascular surgery consult.  Hospital course noted by AKI, altered mental status and elevated troponin secondary demand ischemia.  She ended up receiving 1 unit of blood transfusion as hemoglobin dropped.  No GI bleed.  She underwent left BKA with wound VAC placement on June 24 by Dr. Fonda Simpers as well as left revision BKA on June 25 by Dr. Harden, orthopedic follow-up recommended..  TEE revealed normal LVEF with no evidence of infective endocarditis or vegetation.  PICC line was placed on June 28, recommend follow-up with ID outpatient.  She underwent RHC on 11/29/2023.  Findings revealed severely  elevated PCWP with prominent V waves and mildly elevated right heart filling pressures with moderate PAH and preserved cardiac output.  On August 2 she went to the OR for cystoscopy and declotting of bladder- bladder was filled with clot, no obvious bladder lesion.  According to advanced heart failure specialist her aortic valve stenosis appeared severe on exam but was not found not to be a TAVR or SAVR candidate.  Stated she would need to be infection free for 4 weeks prior to considering aortic valve intervention.  She presents for follow-up. She states she is doing well. She is scheduled to see Urology later this week. Says she is overall doing well from a cardiac perspective. Denies any chest pain, shortness of breath, palpitations, syncope, presyncope, dizziness, orthopnea, PND, swelling or significant weight changes, acute bleeding, or claudication.  ROS: Negative. See HPI.   Studies Reviewed  EKG: EKG is not ordered today.   Right heart cath 11/2023:  1. Severely elevated PCWP with prominent v-waves.  2. Mildly elevated right heart filling pressures.  3. Moderate pulmonary arterial hypertension 4.  Preserved cardiac output.   Echo limited 10/2023:  1. Left ventricular ejection fraction, by estimation, is 25 to 30%. The  left ventricle has severely decreased function. The left ventricle  demonstrates global hypokinesis. The left ventricular internal cavity size  was mildly dilated. Left ventricular  diastolic parameters are consistent with Grade II diastolic dysfunction  (pseudonormalization). Elevated left atrial pressure.   2. The mitral valve is abnormal. Mild to moderate mitral valve  regurgitation. Moderate mitral annular calcification.   3. There  is moderate calcification of the aortic valve. There is moderate  thickening of the aortic valve. Moderate to severe aortic valve stenosis.  Aortic valve Vmax measures 3.21 m/s.   4. The inferior vena cava is dilated in size with <50%  respiratory  variability, suggesting right atrial pressure of 15 mmHg.  Right/left heart cath 07/2022:    Ost LAD to Prox LAD lesion is 75% stenosed.   Ost Cx to Prox Cx lesion is 100% stenosed.   1st Diag lesion is 25% stenosed.   Mid RCA lesion is 70% stenosed.   Mid LM to Dist LM lesion is 40% stenosed.   Mid LAD lesion is 70% stenosed.   LV end diastolic pressure is moderately elevated.   Hemodynamic findings consistent with mild pulmonary hypertension.   There is no aortic valve stenosis.   Aortic saturation 91%, PA saturation 60%, mean RA pressure 9 mmHg, PA pressure 42/24, mean PA pressure 32 mmHg, mean pulmonary capillary wedge pressure 24 mmHg, V waves noted to 45 mmHg.  Cardiac output 8.58 L/min, cardiac index 4.26.   Severe, calcific three-vessel coronary artery disease.  Severe mitral regurgitation by noninvasive workup.  Significant V waves noted on the wedge tracing.  Will obtain cardiac surgery consult for CABG and mitral valve repair.  Physical Exam VS:  BP 118/64   Pulse 71   Ht 5' 4 (1.626 m)   Wt 190 lb (86.2 kg)   SpO2 100%   BMI 32.61 kg/m        Wt Readings from Last 3 Encounters:  12/12/23 190 lb (86.2 kg)  12/09/23 190 lb (86.2 kg)  12/06/23 184 lb 8.4 oz (83.7 kg)    GEN: Obese, 58 y.o. female in no acute distress NECK: No JVD; No carotid bruits CARDIAC: S1/S2, RRR, Grade 2 murmur, no rubs, no gallops RESPIRATORY:  Clear to auscultation without rales, wheezing or rhonchi  ABDOMEN: Soft, non-tender, non-distended EXTREMITIES:  Nonpitting edema to RLE, no edema to LLE - she is s/p left BKA  ASSESSMENT AND PLAN  HFrEF Stage C, NYHA class I symptoms. EF 25-30% via limited Echo 10/2023. Right heat cath on 11/29/2023 revealed severely elevated PCWP with prominent V-waves, preserved CO, mildly elevated right heart filling pressures, and moderate PAH. Euvolemic and well compensated on exam. GDMT limited due to BP trends. Continue torsemide . Not a candidate for  SGLT2i d/t past history - hx of UTI. Low sodium diet, fluid restriction <2L, and daily weights encouraged. Educated to contact our office for weight gain of 2 lbs overnight or 5 lbs in one week. Follow-up with HF clinic as scheduled. Will obtain BMET.   CAD Hx of perioperative NSTEMI in April 2024. Heart cath in April 2024 showed severe 3 vessel CAD with severe mitral regurgitation by non-invasive workup. She continues to be asymptomatic, CABG has been deferred. Stable with no anginal symptoms. No indication for ischemic evaluation. Continue current medication regimen. Heart healthy diet encouraged. Will provide refill of NTG.   Aortic valve stenosis, valvular heart disease Echo 09/2023 showed mild to mod MR and mod to severe AS. Repeat Echo 10/2023 with poor windows, was expected to be mod to severe AS. Was not found to be a TAVR or SAVR candidate d/t her issues with infection. Would need to be infection free x 4 weeks prior to considering aortic valve intervention/surgery. She remains asymptomatic at this time. Will continue to monitor.   PAD Hx of multiple interventions - She is s/p right SFA mehanical orbital atherectomy  and balloon angioplasty in 2021, s/p right SFA stent placement, hx of amputations bialterally transmetatarsal. More recent hospitalization, she is s/p underwent left BKA with wound VAC placement on June 24 by Dr. Fonda Simpers as well as left revision BKA on June 25 by Dr. Harden, orthopedic follow-up recommended. Denies any issues. Continue to follow-up with care team and [facility] as scheduled.   T2DM Last A1C from 1 month ago 5.5. Being managed by PCP. Continue current medication regimen and continue to follow-up with PCP.   Hx of GI bleed and anemia, medication management Most recent Hgb 8.6 - this has been persistent and stable. During past hospitalization, did require PRBC transfusion. No GI bleed noted and she denies any bleeding issues. Will obtain CBC, iron  panel, B12, and folate.  Continue to follow with PCP.  7. Renal insufficiency Most recent serum creatinine was 1.50 with eGFR 41, improved from past trends. Avoid nephrotoxic agents. Will obtain BMET. No medication changes at this   Dispo: Follow-up with Dr. Court as scheduled per patient's request.  Signed, Almarie Crate, NP

## 2023-12-12 ENCOUNTER — Ambulatory Visit (INDEPENDENT_AMBULATORY_CARE_PROVIDER_SITE_OTHER): Admitting: Urology

## 2023-12-12 ENCOUNTER — Encounter: Payer: Self-pay | Admitting: Urology

## 2023-12-12 VITALS — BP 102/63 | HR 80 | Ht 64.0 in | Wt 190.0 lb

## 2023-12-12 DIAGNOSIS — R310 Gross hematuria: Secondary | ICD-10-CM | POA: Diagnosis not present

## 2023-12-12 DIAGNOSIS — R338 Other retention of urine: Secondary | ICD-10-CM

## 2023-12-12 LAB — URINALYSIS, ROUTINE W REFLEX MICROSCOPIC
Specific Gravity, UA: <1.005 — ABNORMAL LOW (ref 1.005–1.030)
pH, UA: 8.5 — ABNORMAL HIGH (ref 5.0–7.5)

## 2023-12-12 LAB — MICROSCOPIC EXAMINATION
RBC, Urine: >30 /HPF — AB (ref 0–2)
WBC, UA: >30 /HPF — AB (ref 0–5)

## 2023-12-12 LAB — BLADDER SCAN AMB NON-IMAGING

## 2023-12-12 NOTE — Progress Notes (Signed)
 Assessment: 1. Gross hematuria   2. Clot retention of urine     Plan: Urine culture sent today Continue current antibiotics for presumed UTI She is voiding spontaneously at this time and is passing small clots.  She does not appear to be in urinary retention.  She does not wish to have a catheter placed at this time. Today I had a discussion with the patient regarding the findings of gross hematuria including the implications and differential diagnoses associated with it.  I also discussed recommendations for further evaluation including the rationale for upper tract imaging and cystoscopy.  I discussed the nature of these procedures including potential risk and complications.  The patient expressed an understanding of these issues. Schedule for CT hematuria protocol followed by cystoscopy Patient advised to go to ER for problems voiding given history of clot retention.   Chief Complaint: Chief Complaint  Patient presents with   Hematuria    HPI: Kayla Schmitt is a 58 y.o. female who presents for continued evaluation of gross hematuria with clot retention. She was seen as a consult in the hospital on 11/30/2023.  She has multiple medical problems and was in the ICU at that time.  She had a prolonged hospitalization from 11/12/2023 - 12/06/2023.  She apparently developed gross hematuria.  Urinalysis from 11/26/2023 showed >50 RBCs, 0-5 WBCs and many bacteria.  No culture was sent.  She was started on CBI which was not functioning.  She was taken to the operating room on 11/30/2023 where she underwent an extensive clot evacuation.  The clot was well-formed requiring resection in order to remove all visible clot material.  She was continued on CBI postoperatively.  Her urine remained clear and the Foley catheter was removed prior to her discharge from the hospital on 12/05/2023. She presents today for follow-up.  She reports that her urine was clear for several days after her discharge from  the hospital.  She then developed some gross hematuria which resolved.  She has had recurrence of the gross hematuria for 2 days now.  She is passing some small clots.  She is not having any dysuria or flank pain.  She was seen by her PCP yesterday and started on cefuroxime.  A urine culture was sent.   Portions of the above documentation were copied from a prior visit for review purposes only.  Allergies: Allergies  Allergen Reactions   Trental  [Pentoxifylline ] Nausea And Vomiting   Vibramycin [Doxycycline] Nausea And Vomiting    PMH: Past Medical History:  Diagnosis Date   Anemia    Aortic stenosis    Arthritis    CHF (congestive heart failure) (HCC)    Coronary artery disease    Diabetes mellitus without complication (HCC)    type 2   GERD (gastroesophageal reflux disease)    Heart murmur    History of blood transfusion    Hypercholesteremia    Hypertension    Hypothyroidism    Mitral regurgitation    Myocardial infarction (HCC)    Peripheral vascular disease (HCC)    PONV (postoperative nausea and vomiting)    Thyroid disease     PSH: Past Surgical History:  Procedure Laterality Date   ABDOMINAL AORTOGRAM W/LOWER EXTREMITY Bilateral 01/14/2020   Procedure: ABDOMINAL AORTOGRAM W/LOWER EXTREMITY;  Surgeon: Gretta Lonni PARAS, MD;  Location: [facility];  Service: Cardiovascular;  Laterality: Bilateral;   ABDOMINAL AORTOGRAM W/LOWER EXTREMITY N/A 07/05/2022   Procedure: ABDOMINAL AORTOGRAM W/LOWER EXTREMITY;  Surgeon: Gretta Lonni  J, MD;  Location: [facility];  Service: Cardiovascular;  Laterality: N/A;   ABDOMINAL AORTOGRAM W/LOWER EXTREMITY N/A 08/02/2022   Procedure: ABDOMINAL AORTOGRAM W/LOWER EXTREMITY;  Surgeon: Gretta Lonni PARAS, MD;  Location: [facility];  Service: Cardiovascular;  Laterality: N/A;   ABDOMINAL AORTOGRAM W/LOWER EXTREMITY N/A 08/06/2022   Procedure: ABDOMINAL AORTOGRAM W/LOWER EXTREMITY;  Surgeon: Gretta Lonni PARAS,  MD;  Location: [facility];  Service: Cardiovascular;  Laterality: N/A;   AMPUTATION Bilateral 07/20/2022   Procedure: AMPUTATION RIGHT GREAT TOE AND SECOND TOE, AMPUTATION LEFT GREAT TOE;  Surgeon: Harden Jerona GAILS, MD;  Location: [facility] OR;  Service: Orthopedics;  Laterality: Bilateral;   AMPUTATION Bilateral 08/08/2022   Procedure: BILATERAL TRANSMETATARSAL AMPUTATION;  Surgeon: Harden Jerona GAILS, MD;  Location: [facility] OR;  Service: Orthopedics;  Laterality: Bilateral;   AMPUTATION Left 10/22/2023   Procedure: AMPUTATION BELOW KNEE;  Surgeon: Lanis Fonda BRAVO, MD;  Location: [facility] OR;  Service: Vascular;  Laterality: Left;   AMPUTATION TOE     BIOPSY  04/06/2023   Procedure: BIOPSY;  Surgeon: Emili Elspeth SQUIBB, MD;  Location: [facility] ENDOSCOPY;  Service: Gastroenterology;;   COLONOSCOPY WITH PROPOFOL  N/A 04/06/2023   Procedure: COLONOSCOPY WITH PROPOFOL ;  Surgeon: Miliana Elspeth SQUIBB, MD;  Location: [facility] ENDOSCOPY;  Service: Gastroenterology;  Laterality: N/A;   CYSTOSCOPY WITH STENT PLACEMENT N/A 11/30/2023   Procedure: CYSTOSCOPY, WITH FULGURATION OF BLEEDING AND CLOT EVACUATION;  Surgeon: Roseann Adine PARAS., MD;  Location: [facility] OR;  Service: Urology;  Laterality: N/A;  CLOT EVACUATION OF BLADDER   ESOPHAGOGASTRODUODENOSCOPY (EGD) WITH PROPOFOL  N/A 04/06/2023   Procedure: ESOPHAGOGASTRODUODENOSCOPY (EGD) WITH PROPOFOL ;  Surgeon: Yani Elspeth SQUIBB, MD;  Location: [facility] ENDOSCOPY;  Service: Gastroenterology;  Laterality: N/A;   GIVENS CAPSULE STUDY N/A 04/06/2023   Procedure: GIVENS CAPSULE STUDY;  Surgeon: Nishat Elspeth SQUIBB, MD;  Location: [facility] ENDOSCOPY;  Service: Gastroenterology;  Laterality: N/A;   PERIPHERAL VASCULAR ATHERECTOMY Right 01/14/2020   Procedure: PERIPHERAL VASCULAR ATHERECTOMY;  Surgeon: Gretta Lonni PARAS, MD;  Location: [facility] INVASIVE CV LAB;  Service: Cardiovascular;  Laterality: Right;  sfa   PERIPHERAL VASCULAR INTERVENTION Right 01/14/2020   Procedure: PERIPHERAL VASCULAR INTERVENTION;   Surgeon: Gretta Lonni PARAS, MD;  [facility]: [facility];  Service: Cardiovascular;  Laterality: Right;  sfa stent    PERIPHERAL VASCULAR INTERVENTION  07/05/2022   Procedure: PERIPHERAL VASCULAR INTERVENTION;  Surgeon: Gretta Lonni PARAS, MD;  [facility];  Service: Cardiovascular;;   PERIPHERAL VASCULAR INTERVENTION  08/02/2022   Procedure: PERIPHERAL VASCULAR INTERVENTION;  Surgeon: Gretta Lonni PARAS, MD;  [facility]: [facility];  Service: Cardiovascular;;   PERIPHERAL VASCULAR INTERVENTION  08/06/2022   Procedure: PERIPHERAL VASCULAR INTERVENTION;  Surgeon: Gretta Lonni PARAS, MD;  Location: [facility] INVASIVE CV LAB;  Service: Cardiovascular;;   PERIPHERAL VASCULAR THROMBECTOMY  08/02/2022   Procedure: PERIPHERAL VASCULAR THROMBECTOMY;  Surgeon: Gretta Lonni PARAS, MD;  [facility]: [facility];  Service: Cardiovascular;;   REVISION AMPUTATION, BELOW THE KNEE Left 10/23/2023   Procedure: REVISION AMPUTATION, BELOW THE KNEE;  Surgeon: Harden Jerona GAILS, MD;  Location: [facility] OR;  Service: Orthopedics;  Laterality: Left;  REVISION LEFT BELOW KNEE AMPUTATION   RIGHT HEART CATH N/A 11/29/2023   Procedure: RIGHT HEART CATH;  Surgeon: Rolan Ezra RAMAN, MD;  Location: [facility] INVASIVE CV LAB;  Service: Cardiovascular;  Laterality: N/A;   RIGHT/LEFT HEART CATH AND CORONARY ANGIOGRAPHY N/A 08/23/2022   Procedure: RIGHT/LEFT HEART CATH AND CORONARY ANGIOGRAPHY;  Surgeon: Dann,  Candyce RAMAN, MD;  Location: [facility];  Service: Cardiovascular;  Laterality: N/A;   SKIN SPLIT GRAFT Right 03/18/2020   Procedure: SKIN GRAFTING RIGHT FOOT ULCER;  Surgeon: Harden Jerona GAILS, MD;  Location: [facility] OR;  Service: Orthopedics;  Laterality: Right;   STUMP REVISION Right 11/30/2022   Procedure: REVISION RIGHT TRANSMETATARSAL AMPUTATION;  Surgeon: Harden Jerona GAILS, MD;  Location: [facility] OR;  Service: Orthopedics;  Laterality: Right;   TEE WITHOUT CARDIOVERSION N/A 08/27/2022   Procedure: TRANSESOPHAGEAL  ECHOCARDIOGRAM;  Surgeon: Hobart Powell BRAVO, MD;  Location: [facility] INVASIVE CV LAB;  Service: Cardiovascular;  Laterality: N/A;   TRANSESOPHAGEAL ECHOCARDIOGRAM (CATH LAB) N/A 10/24/2023   Procedure: TRANSESOPHAGEAL ECHOCARDIOGRAM;  Surgeon: Michele Richardson, DO;  Location: [facility];  Service: Cardiovascular;  Laterality: N/A;   WISDOM TOOTH EXTRACTION      SH: Social History   Tobacco Use   Smoking status: Former    Current packs/day: 0.00    Types: Cigarettes    Quit date: 01/2017    Years since quitting: 6.8   Smokeless tobacco: Never  Vaping Use   Vaping status: Never Used  Substance Use Topics   Alcohol use: Not Currently   Drug use: Yes    Types: Marijuana    Comment: on ocassion- Last time - 11/13/22    ROS: Constitutional:  Negative for fever, chills, weight loss CV: Negative for chest pain, previous MI, hypertension Respiratory:  Negative for shortness of breath, wheezing, sleep apnea, frequent cough GI:  Negative for nausea, vomiting, bloody stool, GERD  PE: BP 102/63   Pulse 80   Ht 5' 4 (1.626 m)   Wt 190 lb (86.2 kg)   BMI 32.61 kg/m  GENERAL APPEARANCE:  Well appearing, well developed, well nourished, NAD HEENT:  Atraumatic, normocephalic, oropharynx clear NECK:  Supple without lymphadenopathy or thyromegaly ABDOMEN:  Soft, non-tender, no masses EXTREMITIES: Without clubbing, cyanosis, or edema NEUROLOGIC:  Alert and oriented x 3, CN II-XII grossly intact MENTAL STATUS:  appropriate BACK:  Non-tender to palpation, No CVAT SKIN:  Warm, dry, and intact   Results: U/A: >30 WBCs, >30 RBCs, many bacteria  Bladder scan (voided about 3 hrs prior):  303 ml

## 2023-12-13 ENCOUNTER — Telehealth (HOSPITAL_COMMUNITY): Payer: Self-pay

## 2023-12-13 NOTE — Progress Notes (Incomplete)
 [facility] CONSULT NOTE   Primary Care: [name], NP Primary Cardiologist: Diannah SHAUNNA Maywood, MD HF Cardiologist: Dr. Cherrie  HPI: 58 y.o. 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    She was admitted with NSTEMI in 04/24. Cath with 3V CAD and significant MR. CABG and MVR recommended but has not undergone surgery d/t multiple medical issues.   She was hospitalized in 06/25 for MRSA bacteremia and necrotizing fasciitis. She ended up requiring L BKA followed by revision. TEE 06/25 with EF 60-65%, mild to moderate MR and moderate to severe AS but no evidence of endocarditis. Four week course of IV abx with daptomycin  recommended. At discharge 07/01, she was transferred to [facility] for inpatient rehab.   Cardiology consulted 07/10 d/t worsening HF symptoms including dyspnea and leg edema. She was started on IV diuretics. Diuretics later stopped d/t rising Scr and it was felt hypoalbuminemia may also be contributing to edema. On 07/15 she was transferred back to [facility] d/t worsening HF symptoms. She was set up for RHC today to better assess volume status. Case was aborted d/t significant dyspnea w/ hypoxia and inability to lie flat. She was given 80 mg lasix  IV with minimal response. CXR with diffuse airspace opacities, likely pulmonary edema.   Advanced Heart Failure asked to see d/t concern for low-output HF.    Past Medical History:  Diagnosis Date   Anemia    Aortic stenosis    Arthritis    CHF (congestive heart failure) (HCC)    Coronary artery disease    Diabetes mellitus without complication (HCC)    type 2   GERD (gastroesophageal reflux disease)    Heart murmur    History of blood transfusion    Hypercholesteremia    Hypertension    Hypothyroidism    Mitral regurgitation    Myocardial infarction (HCC)    Peripheral vascular disease (HCC)    PONV (postoperative nausea and  vomiting)    Thyroid disease     Current Outpatient Medications  Medication Sig Dispense Refill   acetaminophen  (TYLENOL ) 500 MG tablet Take 1,000 mg by mouth 2 (two) times daily as needed for moderate pain (pain score 4-6), headache or fever.     Alcohol Swabs  (PHARMACIST CHOICE ALCOHOL) PADS Apply topically.     aspirin  81 MG EC tablet Take 81 mg by mouth daily.     atorvastatin  (LIPITOR) 40 MG tablet Take 40 mg by mouth daily.     bisacodyl  (DULCOLAX) 10 MG suppository Place 1 suppository (10 mg total) rectally daily as needed for moderate constipation or mild constipation.     clopidogrel  (PLAVIX ) 75 MG tablet TAKE 1 TABLET BY MOUTH EVERY DAY WITH BREAKFAST 90 tablet 3   diphenhydrAMINE  HCl, Sleep, (ZZZQUIL) 25 MG CAPS Take 25 mg by mouth at bedtime as needed (Sleep).     ferrous sulfate  325 (65 FE) MG EC tablet Take 1 tablet (325 mg total) by mouth daily with breakfast. 60 tablet 2   gabapentin  (NEURONTIN ) 300 MG capsule Take 300 mg by mouth at bedtime.     insulin  degludec (TRESIBA  FLEXTOUCH) 100 UNIT/ML FlexTouch Pen Inject 6 Units into the skin at bedtime.     levothyroxine  (SYNTHROID ) 150 MCG tablet Take 150 mcg by mouth daily before breakfast.     midodrine  (PROAMATINE ) 5 MG tablet Take 1 tablet (5 mg total) by mouth 3 (three) times  daily with meals. 90 tablet 0   Multiple Vitamin (MULTIVITAMIN WITH MINERALS) TABS tablet Take 1 tablet by mouth daily. 30 tablet 0   nitroGLYCERIN  (NITROSTAT ) 0.4 MG SL tablet Place 1 tablet (0.4 mg total) under the tongue every 5 (five) minutes x 3 doses as needed for chest pain (if no relief after 3rd dose proceed to ED or call 911). 11/06/2022-New 25 tablet 3   pantoprazole  (PROTONIX ) 40 MG tablet Take 40 mg by mouth daily.     polyethylene glycol (MIRALAX  / GLYCOLAX ) 17 g packet Take 17 g by mouth 2 (two) times daily.     promethazine  (PHENERGAN ) 25 MG tablet Take 1 tablet (25 mg total) by mouth every 6 (six) hours as needed for nausea or vomiting. 20  tablet 0   senna-docusate (SENOKOT-S) 8.6-50 MG tablet Take 1 tablet by mouth 2 (two) times daily.     torsemide  (DEMADEX ) 20 MG tablet Take 4 tablets (80 mg total) by mouth 2 (two) times daily. 240 tablet 0   No current facility-administered medications for this visit.    Allergies  Allergen Reactions   Trental  [Pentoxifylline ] Nausea And Vomiting   Vibramycin [Doxycycline] Nausea And Vomiting      Social History   Socioeconomic History   Marital status: Divorced    Spouse name: Not on file   Number of children: 0   Years of education: Not on file   Highest education level: Not on file  Occupational History   Not on file  Tobacco Use   Smoking status: Former    Current packs/day: 0.00    Types: Cigarettes    Quit date: 01/2017    Years since quitting: 6.8   Smokeless tobacco: Never  Vaping Use   Vaping status: Never Used  Substance and Sexual Activity   Alcohol use: Not Currently   Drug use: Yes    Types: Marijuana    Comment: on ocassion- Last time - 11/13/22   Sexual activity: Yes    Birth control/protection: Post-menopausal  Other Topics Concern   Not on file  Social History Narrative   Not on file   Social Drivers of Health   Financial Resource Strain: Low Risk  (10/19/2023)   Received from [facility]   Overall Financial Resource Strain (CARDIA)    How hard is it for you to pay for the very basics like food, housing, medical care, and heating?: Not hard at all  Food Insecurity: No Food Insecurity (11/14/2023)   Hunger Vital Sign    Worried About Running Out of Food in the Last Year: Never true    Ran Out of Food in the Last Year: Never true  Transportation Needs: No Transportation Needs (11/14/2023)   PRAPARE - Administrator, Civil Service (Medical): No    Lack of Transportation (Non-Medical): No  Physical Activity: Not on file  Stress: Not on file  Social Connections: Not on file  Intimate Partner Violence: Not At Risk (11/14/2023)    Humiliation, Afraid, Rape, and Kick questionnaire    Fear of Current or Ex-Partner: No    Emotionally Abused: No    Physically Abused: No    Sexually Abused: No      Family History  Problem Relation Age of Onset   Diabetes Other    CAD Other     There were no vitals filed for this visit.  PHYSICAL EXAM: General:  NAD. No resp difficulty HEENT: Normal Neck: Supple. No JVD. Cor: Regular rate &  rhythm. No rubs, gallops or murmurs. Lungs: Clear Abdomen: Soft, nontender, nondistended.  Extremities: No cyanosis, clubbing, rash, edema Neuro: Alert & oriented x 3, moves all 4 extremities w/o difficulty. Affect pleasant.  ECG:   ASSESSMENT & PLAN: Acute on chronic HFpEF>Mixed Shock; Cardiogenic/Septic - TEE 6/25: EF 60-65%, RV okay, mild to mod MR, moderate-severe AS with mG 27 mmHg and AVA 1.07 cm2 - TTE 11/15/23 LVEF 25% - Taken for [facility] 7/17, however developed flash pulmonary edema. Unable to lie flat for procedure.  - Repeat echo with EF 25-30%, G2DD, mild to mod MR, mod-severe AS  - GDMT limited by renal function and hypotension (SBP 80s-90s generally). On midodrine  5 mg tid with stable BP - RHC on 8/1 showed severely elevated PCWP, mildly elevated RAP, preserved CO.  - Well diuresed, CVP 7/8. Continue po Torsemide  80 BID.  - Continue midodrine  5 TID for BP support  - GDMT limited by AKI and low BP   2. Acute respiratory failure with hypoxia - Due to pulmonary edema - Extubated 08/04 - Weaning off supplemental O2   3. CAD - NSTEMI in 4/24. Multivessel CAD. CABG had been recommended but managed medically due to other active medical issues - Further ischemic evaluation pending stabilization of renal function => no cath with AKI. Likely will need to defer to outpatient follow-up. - continue aspirin  + statin   4. Valvular heart disease - Mild to moderate MR and moderate to severe AS on echo 6/25 - Repeat echo 7/25 with extremely poor windows, based on exam expect AS  moderate-severe - Echo 11/20/23 with Vmax 3.21, DVT 0.26, mod to severe AS, mG 22 - AS appears severe on exan but she is not a TAVR or SAVR candidate currently with infectious issues. Would need to be infection free X 4 weeks prior to considering AoV intervention.    5. MRSA bacteremia - Necrotizing fasciitis - S/p recent left BKA 6/25; S/p R TMA in 4/24   6. UTI - Completed course of Ceftriaxone    7. PAD - hx SFA stents - On aspirin  + statin. Had been on plavix  but stopped d/t risk of bleeding.   8. Anemia - s/p 1 u RBC 7/14 - Hx GI bleed from AVMs.  - fecal occult negative - No recurrent hematuria - Hgb stable 8.3 yesterday   9. AKI on CKD IIIa - In setting of volume overload/low-output.  - Started on CVVH 7/19, stopped 7/22.  - Some renal recovery and creatinine had been trending down, but worsened in setting bladder outlet obstruction from extensive clots in bladder.  Now s/p cystoscopy and clot removal, good UOP and renal function recovering   Would be stable for discharge today from AHF standpoint. Has f/u arranged in AHF clinic.

## 2023-12-13 NOTE — Telephone Encounter (Signed)
 Called to confirm/remind patient of their appointment at the [facility] on 12/16/23.   Appointment:   [x] Confirmed  [] Left mess   [] No answer/No voice mail  [] VM Full/unable to leave message  [] Phone not in service  Patient reminded to bring all medications and/or complete list.  Confirmed patient has transportation. Gave directions, instructed to utilize valet parking.

## 2023-12-15 ENCOUNTER — Emergency Department (HOSPITAL_COMMUNITY)

## 2023-12-15 ENCOUNTER — Encounter (HOSPITAL_COMMUNITY): Payer: Self-pay

## 2023-12-15 ENCOUNTER — Inpatient Hospital Stay (HOSPITAL_COMMUNITY)
Admission: EM | Admit: 2023-12-15 | Discharge: 2023-12-18 | DRG: 291 | Disposition: A | Discharge status: Home Health | Attending: Family Medicine | Admitting: Family Medicine

## 2023-12-15 ENCOUNTER — Other Ambulatory Visit: Payer: Self-pay

## 2023-12-15 ENCOUNTER — Inpatient Hospital Stay (HOSPITAL_COMMUNITY)

## 2023-12-15 DIAGNOSIS — E1151 Type 2 diabetes mellitus with diabetic peripheral angiopathy without gangrene: Secondary | ICD-10-CM | POA: Diagnosis present

## 2023-12-15 DIAGNOSIS — E1122 Type 2 diabetes mellitus with diabetic chronic kidney disease: Secondary | ICD-10-CM | POA: Diagnosis present

## 2023-12-15 DIAGNOSIS — J9601 Acute respiratory failure with hypoxia: Secondary | ICD-10-CM | POA: Diagnosis present

## 2023-12-15 DIAGNOSIS — I2510 Atherosclerotic heart disease of native coronary artery without angina pectoris: Secondary | ICD-10-CM | POA: Diagnosis present

## 2023-12-15 DIAGNOSIS — E1165 Type 2 diabetes mellitus with hyperglycemia: Secondary | ICD-10-CM | POA: Diagnosis present

## 2023-12-15 DIAGNOSIS — K219 Gastro-esophageal reflux disease without esophagitis: Secondary | ICD-10-CM | POA: Diagnosis present

## 2023-12-15 DIAGNOSIS — Z79899 Other long term (current) drug therapy: Secondary | ICD-10-CM

## 2023-12-15 DIAGNOSIS — E871 Hypo-osmolality and hyponatremia: Secondary | ICD-10-CM | POA: Diagnosis present

## 2023-12-15 DIAGNOSIS — Z794 Long term (current) use of insulin: Secondary | ICD-10-CM

## 2023-12-15 DIAGNOSIS — I10 Essential (primary) hypertension: Secondary | ICD-10-CM | POA: Diagnosis present

## 2023-12-15 DIAGNOSIS — Z89411 Acquired absence of right great toe: Secondary | ICD-10-CM

## 2023-12-15 DIAGNOSIS — E785 Hyperlipidemia, unspecified: Secondary | ICD-10-CM

## 2023-12-15 DIAGNOSIS — Z87891 Personal history of nicotine dependence: Secondary | ICD-10-CM

## 2023-12-15 DIAGNOSIS — I130 Hypertensive heart and chronic kidney disease with heart failure and stage 1 through stage 4 chronic kidney disease, or unspecified chronic kidney disease: Principal | ICD-10-CM | POA: Diagnosis present

## 2023-12-15 DIAGNOSIS — J810 Acute pulmonary edema: Principal | ICD-10-CM

## 2023-12-15 DIAGNOSIS — I252 Old myocardial infarction: Secondary | ICD-10-CM

## 2023-12-15 DIAGNOSIS — E039 Hypothyroidism, unspecified: Secondary | ICD-10-CM | POA: Diagnosis present

## 2023-12-15 DIAGNOSIS — Z89512 Acquired absence of left leg below knee: Secondary | ICD-10-CM

## 2023-12-15 DIAGNOSIS — N1832 Chronic kidney disease, stage 3b: Secondary | ICD-10-CM | POA: Diagnosis present

## 2023-12-15 DIAGNOSIS — I509 Heart failure, unspecified: Secondary | ICD-10-CM

## 2023-12-15 DIAGNOSIS — Z8249 Family history of ischemic heart disease and other diseases of the circulatory system: Secondary | ICD-10-CM

## 2023-12-15 DIAGNOSIS — E876 Hypokalemia: Secondary | ICD-10-CM | POA: Diagnosis present

## 2023-12-15 DIAGNOSIS — I4510 Unspecified right bundle-branch block: Secondary | ICD-10-CM | POA: Diagnosis present

## 2023-12-15 DIAGNOSIS — Z6832 Body mass index (BMI) 32.0-32.9, adult: Secondary | ICD-10-CM

## 2023-12-15 DIAGNOSIS — I739 Peripheral vascular disease, unspecified: Secondary | ICD-10-CM | POA: Diagnosis not present

## 2023-12-15 DIAGNOSIS — I25118 Atherosclerotic heart disease of native coronary artery with other forms of angina pectoris: Secondary | ICD-10-CM | POA: Diagnosis not present

## 2023-12-15 DIAGNOSIS — Z89412 Acquired absence of left great toe: Secondary | ICD-10-CM

## 2023-12-15 DIAGNOSIS — D509 Iron deficiency anemia, unspecified: Secondary | ICD-10-CM | POA: Diagnosis present

## 2023-12-15 DIAGNOSIS — E66811 Obesity, class 1: Secondary | ICD-10-CM | POA: Diagnosis present

## 2023-12-15 DIAGNOSIS — E1169 Type 2 diabetes mellitus with other specified complication: Secondary | ICD-10-CM | POA: Diagnosis present

## 2023-12-15 DIAGNOSIS — I5023 Acute on chronic systolic (congestive) heart failure: Secondary | ICD-10-CM | POA: Diagnosis present

## 2023-12-15 DIAGNOSIS — I080 Rheumatic disorders of both mitral and aortic valves: Secondary | ICD-10-CM | POA: Diagnosis present

## 2023-12-15 DIAGNOSIS — Z7982 Long term (current) use of aspirin: Secondary | ICD-10-CM

## 2023-12-15 DIAGNOSIS — E118 Type 2 diabetes mellitus with unspecified complications: Secondary | ICD-10-CM | POA: Diagnosis present

## 2023-12-15 DIAGNOSIS — Z7902 Long term (current) use of antithrombotics/antiplatelets: Secondary | ICD-10-CM | POA: Diagnosis not present

## 2023-12-15 DIAGNOSIS — N1831 Chronic kidney disease, stage 3a: Secondary | ICD-10-CM | POA: Diagnosis present

## 2023-12-15 DIAGNOSIS — E7800 Pure hypercholesterolemia, unspecified: Secondary | ICD-10-CM | POA: Diagnosis present

## 2023-12-15 DIAGNOSIS — I2721 Secondary pulmonary arterial hypertension: Secondary | ICD-10-CM | POA: Diagnosis present

## 2023-12-15 DIAGNOSIS — Z833 Family history of diabetes mellitus: Secondary | ICD-10-CM

## 2023-12-15 DIAGNOSIS — Z79890 Hormone replacement therapy (postmenopausal): Secondary | ICD-10-CM

## 2023-12-15 DIAGNOSIS — D649 Anemia, unspecified: Secondary | ICD-10-CM | POA: Diagnosis present

## 2023-12-15 LAB — CBC WITH DIFFERENTIAL/PLATELET
Abs Immature Granulocytes: 0.02 K/uL (ref 0.00–0.07)
Basophils Absolute: 0 K/uL (ref 0.0–0.1)
Basophils Relative: 1 %
Eosinophils Absolute: 0.1 K/uL (ref 0.0–0.5)
Eosinophils Relative: 1 %
HCT: 23.3 % — ABNORMAL LOW (ref 36.0–46.0)
Hemoglobin: 7.2 g/dL — ABNORMAL LOW (ref 12.0–15.0)
Immature Granulocytes: 0 %
Lymphocytes Relative: 29 %
Lymphs Abs: 2 K/uL (ref 0.7–4.0)
MCH: 30.3 pg (ref 26.0–34.0)
MCHC: 30.9 g/dL (ref 30.0–36.0)
MCV: 97.9 fL (ref 80.0–100.0)
Monocytes Absolute: 0.6 K/uL (ref 0.1–1.0)
Monocytes Relative: 9 %
Neutro Abs: 4.3 K/uL (ref 1.7–7.7)
Neutrophils Relative %: 60 %
Platelets: 271 K/uL (ref 150–400)
RBC: 2.38 MIL/uL — ABNORMAL LOW (ref 3.87–5.11)
RDW: 18.2 % — ABNORMAL HIGH (ref 11.5–15.5)
WBC: 7.1 K/uL (ref 4.0–10.5)
nRBC: 0 % (ref 0.0–0.2)

## 2023-12-15 LAB — GLUCOSE, CAPILLARY: Glucose-Capillary: 202 mg/dL — ABNORMAL HIGH (ref 70–99)

## 2023-12-15 LAB — IRON AND TIBC
Iron: 33 ug/dL (ref 28–170)
Saturation Ratios: 12 % (ref 10.4–31.8)
TIBC: 273 ug/dL (ref 250–450)
UIBC: 240 ug/dL

## 2023-12-15 LAB — RETICULOCYTES
Immature Retic Fract: 15 % (ref 2.3–15.9)
RBC.: 2.4 MIL/uL — ABNORMAL LOW (ref 3.87–5.11)
Retic Count, Absolute: 109.9 K/uL (ref 19.0–186.0)
Retic Ct Pct: 4.6 % — ABNORMAL HIGH (ref 0.4–3.1)

## 2023-12-15 LAB — COMPREHENSIVE METABOLIC PANEL WITH GFR
ALT: 33 U/L (ref 0–44)
AST: 27 U/L (ref 15–41)
Albumin: 3.1 g/dL — ABNORMAL LOW (ref 3.5–5.0)
Alkaline Phosphatase: 104 U/L (ref 38–126)
Anion gap: 14 (ref 5–15)
BUN: 63 mg/dL — ABNORMAL HIGH (ref 6–20)
CO2: 22 mmol/L (ref 22–32)
Calcium: 8.8 mg/dL — ABNORMAL LOW (ref 8.9–10.3)
Chloride: 95 mmol/L — ABNORMAL LOW (ref 98–111)
Creatinine, Ser: 1.7 mg/dL — ABNORMAL HIGH (ref 0.44–1.00)
GFR, Estimated: 35 mL/min — ABNORMAL LOW (ref >60)
Glucose, Bld: 273 mg/dL — ABNORMAL HIGH (ref 70–99)
Potassium: 3.6 mmol/L (ref 3.5–5.1)
Sodium: 131 mmol/L — ABNORMAL LOW (ref 135–145)
Total Bilirubin: 0.8 mg/dL (ref 0.0–1.2)
Total Protein: 7.7 g/dL (ref 6.5–8.1)

## 2023-12-15 LAB — PREPARE RBC (CROSSMATCH)

## 2023-12-15 LAB — VITAMIN B12: Vitamin B-12: 606 pg/mL (ref 180–914)

## 2023-12-15 LAB — LACTIC ACID, PLASMA
Lactic Acid, Venous: 1.9 mmol/L (ref 0.5–1.9)
Lactic Acid, Venous: 1.4 mmol/L (ref 0.5–1.9)

## 2023-12-15 LAB — FERRITIN: Ferritin: 388 ng/mL — ABNORMAL HIGH (ref 11–307)

## 2023-12-15 LAB — BRAIN NATRIURETIC PEPTIDE: B Natriuretic Peptide: 1108 pg/mL — ABNORMAL HIGH (ref 0.0–100.0)

## 2023-12-15 LAB — TROPONIN I (HIGH SENSITIVITY): Troponin I (High Sensitivity): 89 ng/L — ABNORMAL HIGH (ref <18)

## 2023-12-15 LAB — FOLATE: Folate: 16.1 ng/mL (ref >5.9)

## 2023-12-15 MED ORDER — FUROSEMIDE 10 MG/ML IJ SOLN
20.0000 mg | Freq: Once | INTRAMUSCULAR | Status: AC
  Administered 2023-12-15: 20 mg via INTRAVENOUS
  Filled 2023-12-15: qty 2

## 2023-12-15 MED ORDER — IPRATROPIUM-ALBUTEROL 0.5-2.5 (3) MG/3ML IN SOLN
3.0000 mL | Freq: Once | RESPIRATORY_TRACT | Status: AC
  Administered 2023-12-15: 3 mL via RESPIRATORY_TRACT

## 2023-12-15 MED ORDER — LEVOTHYROXINE SODIUM 75 MCG PO TABS
150.0000 ug | ORAL_TABLET | Freq: Every day | ORAL | Status: DC
  Administered 2023-12-16 – 2023-12-18 (×3): 150 ug via ORAL
  Filled 2023-12-15 (×3): qty 2

## 2023-12-15 MED ORDER — FERROUS SULFATE 325 (65 FE) MG PO TABS
325.0000 mg | ORAL_TABLET | Freq: Every day | ORAL | Status: DC
  Administered 2023-12-16 – 2023-12-18 (×3): 325 mg via ORAL
  Filled 2023-12-15 (×3): qty 1

## 2023-12-15 MED ORDER — ENOXAPARIN SODIUM 40 MG/0.4ML IJ SOSY
40.0000 mg | PREFILLED_SYRINGE | INTRAMUSCULAR | Status: DC
  Administered 2023-12-15 – 2023-12-17 (×3): 40 mg via SUBCUTANEOUS
  Filled 2023-12-15 (×3): qty 0.4

## 2023-12-15 MED ORDER — ACETAMINOPHEN 650 MG RE SUPP: 650.0000 mg | Freq: Four times a day (QID) | RECTAL | Status: DC | PRN

## 2023-12-15 MED ORDER — PANTOPRAZOLE SODIUM 40 MG PO TBEC
40.0000 mg | DELAYED_RELEASE_TABLET | Freq: Every day | ORAL | Status: DC
  Administered 2023-12-16 – 2023-12-18 (×3): 40 mg via ORAL
  Filled 2023-12-15 (×3): qty 1

## 2023-12-15 MED ORDER — CLOPIDOGREL BISULFATE 75 MG PO TABS
75.0000 mg | ORAL_TABLET | Freq: Every day | ORAL | Status: DC
  Administered 2023-12-16 – 2023-12-18 (×3): 75 mg via ORAL
  Filled 2023-12-15 (×3): qty 1

## 2023-12-15 MED ORDER — CHLORHEXIDINE GLUCONATE CLOTH 2 % EX PADS
6.0000 | MEDICATED_PAD | Freq: Every day | CUTANEOUS | Status: DC
  Administered 2023-12-16 – 2023-12-17 (×2): 6 via TOPICAL

## 2023-12-15 MED ORDER — ATORVASTATIN CALCIUM 40 MG PO TABS
40.0000 mg | ORAL_TABLET | Freq: Every day | ORAL | Status: DC
  Administered 2023-12-16 – 2023-12-18 (×3): 40 mg via ORAL
  Filled 2023-12-15 (×3): qty 1

## 2023-12-15 MED ORDER — ACETAMINOPHEN 325 MG PO TABS: 650.0000 mg | ORAL_TABLET | Freq: Four times a day (QID) | ORAL | Status: DC | PRN

## 2023-12-15 MED ORDER — GABAPENTIN 300 MG PO CAPS
300.0000 mg | ORAL_CAPSULE | Freq: Every day | ORAL | Status: DC
  Administered 2023-12-16 – 2023-12-17 (×2): 300 mg via ORAL
  Filled 2023-12-15 (×2): qty 1

## 2023-12-15 MED ORDER — MIDODRINE HCL 5 MG PO TABS
5.0000 mg | ORAL_TABLET | Freq: Three times a day (TID) | ORAL | Status: DC
Start: 2023-12-15 — End: 2023-12-18
  Administered 2023-12-15 – 2023-12-18 (×8): 5 mg via ORAL
  Filled 2023-12-15 (×8): qty 1

## 2023-12-15 MED ORDER — FUROSEMIDE 10 MG/ML IJ SOLN
60.0000 mg | Freq: Two times a day (BID) | INTRAMUSCULAR | Status: DC
  Administered 2023-12-15 – 2023-12-17 (×4): 60 mg via INTRAVENOUS
  Filled 2023-12-15 (×4): qty 6

## 2023-12-15 MED ORDER — SODIUM CHLORIDE 0.9% IV SOLUTION: Freq: Once | INTRAVENOUS | Status: AC

## 2023-12-15 MED ORDER — ONDANSETRON HCL 4 MG/2ML IJ SOLN
4.0000 mg | Freq: Four times a day (QID) | INTRAMUSCULAR | Status: DC | PRN
Start: 2023-12-15 — End: 2023-12-18
  Administered 2023-12-16: 4 mg via INTRAVENOUS
  Filled 2023-12-15: qty 2

## 2023-12-15 MED ORDER — FUROSEMIDE 10 MG/ML IJ SOLN
40.0000 mg | INTRAMUSCULAR | Status: AC
  Administered 2023-12-15: 40 mg via INTRAVENOUS
  Filled 2023-12-15: qty 4

## 2023-12-15 MED ORDER — ASPIRIN 81 MG PO TBEC
81.0000 mg | DELAYED_RELEASE_TABLET | Freq: Every day | ORAL | Status: DC
  Administered 2023-12-16 – 2023-12-18 (×3): 81 mg via ORAL
  Filled 2023-12-15 (×3): qty 1

## 2023-12-15 MED ORDER — NITROGLYCERIN 0.4 MG SL SUBL: 0.4000 mg | SUBLINGUAL_TABLET | SUBLINGUAL | Status: DC | PRN

## 2023-12-15 MED ORDER — MORPHINE SULFATE (PF) 2 MG/ML IV SOLN
1.0000 mg | Freq: Once | INTRAVENOUS | Status: AC
  Administered 2023-12-15: 1 mg via INTRAVENOUS
  Filled 2023-12-15: qty 1

## 2023-12-15 MED ORDER — ADULT MULTIVITAMIN W/MINERALS CH
1.0000 | ORAL_TABLET | Freq: Every day | ORAL | Status: DC
  Administered 2023-12-16 – 2023-12-18 (×3): 1 via ORAL
  Filled 2023-12-15 (×3): qty 1

## 2023-12-15 MED ORDER — ONDANSETRON HCL 4 MG PO TABS
4.0000 mg | ORAL_TABLET | Freq: Four times a day (QID) | ORAL | Status: DC | PRN
Start: 2023-12-15 — End: 2023-12-18

## 2023-12-15 NOTE — Assessment & Plan Note (Signed)
 Echocardiogram with reduced LV systolic function EF 25 to 30%, global hypokinesis, mild dilatated LV cavity, mild to moderate mitral regurgitation, moderate to severe aortic stenosis.   Plan to continue diuresis with IV furosemide  60 mg bid  Continue blood pressure support with midodrine .  Limited medical therapy due to risk of hypotension.  Strict in and outs, daily weights.

## 2023-12-15 NOTE — ED Provider Notes (Signed)
 [place] EMERGENCY DEPARTMENT AT [facility] Provider Note   CSN: [id number] Arrival date & time: 12/15/23  9055     Patient presents with: Shortness of Breath   Kayla Schmitt is a 58 y.o. female.    Shortness of Breath  PT is a 58 y/o female - with hx of recent LLE BKA -she is on 80 mg twice a day of torsemide  for diuresis, midodrine  for blood pressure support, has nephrologist cardiologist and vascular surgeons due to her recent illnesses.  She has known congestive heart failure coronary disease peripheral vascular disease and iron  deficiency anemia, she was told recently that her blood levels were down around 7-1/2 hemoglobin.  I have reviewed the prior discharge summary from last week, the patient has a known history of aortic stenosis coronary disease mitral regurgitation heart failure as well as hypertension, she was fluid overloaded and admitted for that reason.  She had been intubated in the ICU, she became febrile during the stay ultimately was eventually extubated, she was given 1 unit of packed red blood cells although her hemoglobin remained low, kidney function improved to some degree, continued to be Diarrest.  She presents today with increasing shortness of breath, has been having to use her oxygen  more often at home which she uses as needed.  No coughing no fever, taking her medications as prescribed.    Prior to Admission medications   Medication Sig Start Date End Date Taking? Authorizing Provider  acetaminophen  (TYLENOL ) 500 MG tablet Take 1,000 mg by mouth 2 (two) times daily as needed for moderate pain (pain score 4-6), headache or fever.    [provider]  Alcohol Swabs  (PHARMACIST CHOICE ALCOHOL) PADS Apply topically. 04/09/23   [provider]  aspirin  81 MG EC tablet Take 81 mg by mouth daily.    [provider]  atorvastatin  (LIPITOR) 40 MG tablet Take 40 mg by mouth daily. 12/14/19   [provider]   bisacodyl  (DULCOLAX) 10 MG suppository Place 1 suppository (10 mg total) rectally daily as needed for moderate constipation or mild constipation. 10/29/23   Hongalgi, Anand D, MD  clopidogrel  (PLAVIX ) 75 MG tablet TAKE 1 TABLET BY MOUTH EVERY DAY WITH BREAKFAST 01/28/23   Sheree Penne Bruckner, MD  diphenhydrAMINE  HCl, Sleep, (ZZZQUIL) 25 MG CAPS Take 25 mg by mouth at bedtime as needed (Sleep).    [provider]  ferrous sulfate  325 (65 FE) MG EC tablet Take 1 tablet (325 mg total) by mouth daily with breakfast. 04/07/23 04/06/24  Fairy Frames, MD  gabapentin  (NEURONTIN ) 300 MG capsule Take 300 mg by mouth at bedtime.    [provider]  insulin  degludec (TRESIBA  FLEXTOUCH) 100 UNIT/ML FlexTouch Pen Inject 6 Units into the skin at bedtime. 11/27/23   Arrien, Mauricio Daniel, MD  levothyroxine  (SYNTHROID ) 150 MCG tablet Take 150 mcg by mouth daily before breakfast. 12/02/19   [provider]  midodrine  (PROAMATINE ) 5 MG tablet Take 1 tablet (5 mg total) by mouth 3 (three) times daily with meals. 12/06/23   Arrien, Mauricio Daniel, MD  Multiple Vitamin (MULTIVITAMIN WITH MINERALS) TABS tablet Take 1 tablet by mouth daily. 11/28/23   Arrien, Mauricio Daniel, MD  nitroGLYCERIN  (NITROSTAT ) 0.4 MG SL tablet Place 1 tablet (0.4 mg total) under the tongue every 5 (five) minutes x 3 doses as needed for chest pain (if no relief after 3rd dose proceed to ED or call 911). 11/06/2022-New 12/09/23   Miriam Norris, NP  pantoprazole  (PROTONIX ) 40  MG tablet Take 40 mg by mouth daily. 12/21/19   [provider]  polyethylene glycol (MIRALAX  / GLYCOLAX ) 17 g packet Take 17 g by mouth 2 (two) times daily. 10/29/23   Hongalgi, Anand D, MD  promethazine  (PHENERGAN ) 25 MG tablet Take 1 tablet (25 mg total) by mouth every 6 (six) hours as needed for nausea or vomiting. 10/15/23   Francesca Elsie CROME, MD  senna-docusate (SENOKOT-S) 8.6-50 MG tablet Take 1 tablet by mouth 2 (two) times daily.  10/29/23   Hongalgi, Anand D, MD  torsemide  (DEMADEX ) 20 MG tablet Take 4 tablets (80 mg total) by mouth 2 (two) times daily. 11/27/23   Arrien, Mauricio Daniel, MD    Allergies: Trental  [pentoxifylline ] and Vibramycin [doxycycline]    Review of Systems  Respiratory:  Positive for shortness of breath.   All other systems reviewed and are negative.   Updated Vital Signs BP (!) 104/57   Pulse (!) 103   Temp 98.4 F (36.9 C) (Oral)   Resp (!) 29   Ht 1.626 m (5' 4)   Wt 86.2 kg   SpO2 95%   BMI 32.61 kg/m   Physical Exam Vitals and nursing note reviewed.  Constitutional:      General: She is in acute distress.     Appearance: She is well-developed.  HENT:     Head: Normocephalic and atraumatic.     Mouth/Throat:     Pharynx: No oropharyngeal exudate.  Eyes:     General: No scleral icterus.       Right eye: No discharge.        Left eye: No discharge.     Conjunctiva/sclera: Conjunctivae normal.     Pupils: Pupils are equal, round, and reactive to light.  Neck:     Thyroid: No thyromegaly.     Vascular: No JVD.  Cardiovascular:     Rate and Rhythm: Regular rhythm. Tachycardia present.     Heart sounds: Normal heart sounds. No murmur heard.    No friction rub. No gallop.  Pulmonary:     Effort: Respiratory distress present.     Breath sounds: Rales present. No wheezing.  Abdominal:     General: Bowel sounds are normal. There is no distension.     Palpations: Abdomen is soft. There is no mass.     Tenderness: There is no abdominal tenderness.  Musculoskeletal:        General: No tenderness. Normal range of motion.     Cervical back: Normal range of motion and neck supple.     Right lower leg: Edema present.     Comments: Left lower extremity BKA intact  Lymphadenopathy:     Cervical: No cervical adenopathy.  Skin:    General: Skin is warm and dry.     Findings: No erythema or rash.  Neurological:     Mental Status: She is alert.     Coordination: Coordination  normal.  Psychiatric:        Behavior: Behavior normal.     (all labs ordered are listed, but only abnormal results are displayed) Labs Reviewed  CBC WITH DIFFERENTIAL/PLATELET  COMPREHENSIVE METABOLIC PANEL WITH GFR  BRAIN NATRIURETIC PEPTIDE  VITAMIN B12  FOLATE  IRON  AND TIBC  FERRITIN  RETICULOCYTES  LACTIC ACID, PLASMA  LACTIC ACID, PLASMA  TYPE AND SCREEN  TROPONIN I (HIGH SENSITIVITY)    EKG: EKG Interpretation Date/Time:  Sunday December 15 2023 10:00:34 EDT Ventricular Rate:  103 PR Interval:  187 QRS  Duration:  118 QT Interval:  374 QTC Calculation: 490 R Axis:   47  Text Interpretation: Sinus or ectopic atrial tachycardia Incomplete right bundle branch block Anterior infarct, old since last tracing no significant change Confirmed by Cleotilde Rogue ([id number]) on 12/15/2023 10:05:18 AM  Radiology: [facility] Chest Port 1 View Result Date: 12/15/2023 CLINICAL DATA:  Short of breath, rales EXAM: PORTABLE CHEST 1 VIEW COMPARISON:  A 525 FINDINGS: Single frontal view of the chest demonstrates interval removal of the left internal jugular catheter. The cardiac silhouette remains enlarged, with widespread interstitial and ground-glass airspace disease unchanged, compatible with edema. Small bilateral pleural effusions are noted, decreased since prior study. No pneumothorax. IMPRESSION: 1. Stable volume status, with widespread pulmonary edema as before. 2. Small bilateral pleural effusions, decreased since prior study. Electronically Signed   By: Ozell Daring M.D.   On: 12/15/2023 10:14     .Critical Care  Performed by: Cleotilde Rogue, MD Authorized by: Cleotilde Rogue, MD   Critical care provider statement:    Critical care time (minutes):  45   Critical care time was exclusive of:  Separately billable procedures and treating other patients and teaching time   Critical care was necessary to treat or prevent imminent or life-threatening deterioration of the following conditions:   Cardiac failure and respiratory failure   Critical care was time spent personally by me on the following activities:  Development of treatment plan with patient or surrogate, discussions with consultants, evaluation of patient's response to treatment, examination of patient, obtaining history from patient or surrogate, review of old charts, re-evaluation of patient's condition, pulse oximetry, ordering and review of radiographic studies, ordering and review of laboratory studies and ordering and performing treatments and interventions   I assumed direction of critical care for this patient from another provider in my specialty: no     Care discussed with: admitting provider   Comments:          Medications Ordered in the ED  furosemide  (LASIX ) injection 40 mg (has no administration in time range)                                    Medical Decision Making Amount and/or Complexity of Data Reviewed Labs: ordered. Radiology: ordered. ECG/medicine tests: ordered.  Risk Prescription drug management.    This patient presents to the ED for concern of shortness of breath, this involves an extensive number of treatment options, and is a complaint that carries with it a high risk of complications and morbidity.  The differential diagnosis includes recurrent congestive heart failure, pneumonia, no history of interstitial lung disease, patient is 88% on room air and tachypneic   Co morbidities / Chronic conditions that complicate the patient evaluation  Known congestive heart failure   Additional history obtained:  Additional history obtained from EMR External records from outside source obtained and reviewed including medical record including recent admission and discharge summaries, operative notes, echocardiogram and right heart cath recently performed, ejection fraction 25 to 30% with grade 2 diastolic dysfunction, moderate pulmonary arterial hypertension Ultimately the patient had  presented in June 2025 after having known stenting of her lower extremity vasculature and 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  The pertinent results include: Severely  anemic at 7.2, BNP is elevated over thousand consistent with heart failure exacerbation, troponin leak probably consistent with heart failure exacerbation   Imaging Studies ordered:  I ordered imaging studies including portable chest x-ray I independently visualized and interpreted imaging which showed widespread pulmonary edema, no focal infiltrates I agree with the radiologist interpretation   Cardiac Monitoring: / EKG:  The patient was maintained on a cardiac monitor.  I personally viewed and interpreted the cardiac monitored which showed an underlying rhythm of: Sinus tachycardia   Problem List / ED Course / Critical interventions / Medication management  This patient presents with increasing shortness of breath hypoxic respiratory failure and ongoing and recurrent worsening pulmonary edema. I ordered medication including oxygen  supplementation for a baseline oxygen  of around 88 to 87%, furosemide  Blood transfusion of 1 unit due to hemoglobin of 7.2 with CHF exacerbation Reevaluation of the patient after these medicines showed that the patient slight improvement I have reviewed the patients home medicines and have made adjustments as needed   Consultations Obtained:  I requested consultation with the hospitalist,  and discussed lab and imaging findings as well as pertinent plan - they recommend: Admission to the hospital, appreciate Dr. Arrien for taking care of this very sick patient   Social Determinants of Health:  Decompensated heart failure   Test / Admission - Considered:  Admit to high level of care for hypoxic  respiratory failure      Final diagnoses:  Acute pulmonary edema (HCC)  Acute congestive heart failure, unspecified heart failure type [facility])    ED Discharge Orders     None          Cleotilde Rogue, MD 12/15/23 1145

## 2023-12-15 NOTE — ED Notes (Signed)
 ED Provider at bedside.

## 2023-12-15 NOTE — Progress Notes (Signed)
   12/15/23 2010  Provider Notification  Provider Name/Title Dr. Lawence  Date Provider Notified 12/15/23  Time Provider Notified 2010  Method of Notification Page  Notification Reason Change in status  Provider response See new orders   Pt c/o trouble catching her breath. unable to rest my breathing unable to get out of tripod position. VS- HR 99- BP114/53- RR 34- O2 sat 97% on 4L [place]. Crackles heard in posterior lung bases bilaterally. Orders received.

## 2023-12-15 NOTE — Assessment & Plan Note (Signed)
 Continue blood pressure support with midodrine.

## 2023-12-15 NOTE — Assessment & Plan Note (Signed)
 Sp left BKA Continue dual antiplatelet therapy and statin

## 2023-12-15 NOTE — ED Notes (Signed)
 Patient informed this nurse that she did take a nitroglycerin  sublingual around 9am this morning. MD made aware.

## 2023-12-15 NOTE — Assessment & Plan Note (Signed)
 Patient will have one unit PRBC ordered per ED for symptomatic anemia Iron  panel with serum iron  at 33, with TIBC 273 and transferrin saturation 12, with ferritin 388.  Patient had severe anemia on prior hospitalization due to hematuria.  Follow up cell count in am.

## 2023-12-15 NOTE — Assessment & Plan Note (Signed)
 High sensitive troponin elevation due to heart failure decompensation, no acute coronary syndrome.   No chest pain, continue aspirin  and clopidogrel .  Continue statin.

## 2023-12-15 NOTE — Assessment & Plan Note (Signed)
 Hyponatremia hypokalemia   Renal function with serum cr at 1,71 with K at 3,3 and serum bicarbonate at 25  Na 135  Continue diuresis with furosemide , add Kcl 40 meq x2 and follow up renal function and electrolytes.

## 2023-12-15 NOTE — ED Triage Notes (Addendum)
 Patient come in POV for complaint of SOB, had a Left Below the knee amputation back in 6/24. On 88% room air. Had Oxygen  at home PRN. Came home from hospital last Friday. Became weaker yesterday.

## 2023-12-15 NOTE — ED Notes (Signed)
 X-ray at bedside.

## 2023-12-15 NOTE — Assessment & Plan Note (Signed)
 Continue pantoprazole .

## 2023-12-15 NOTE — H&P (Signed)
 History and Physical    Patient: Kayla Schmitt FMW:[id number] DOB: 21-Feb-1966 DOA: 12/15/2023 DOS: the patient was seen and examined on 12/15/2023 PCP: [name], NP  Patient coming from: Home  Chief Complaint:  Chief Complaint  Patient presents with   Shortness of Breath   HPI: Kayla Schmitt is a 58 y.o. 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  Recent hospitalization in [facility] 07/15 to 12/06/23 for heart failure exacerbation, complicated with cardiogenic shock, AKI and urinary tract infection with severe hematuria. She was discharged home with instructions to take torsemide  80 mg po bid.   Patient was doing well until 48 hrs ago when she noticed persistent and worsening dyspnea. Initially with exertion and then at rest with minimal efforts, associated with PND, orthopnea and right lower extremity edema. No chest pain, cough or wheezing.  She reports being compliant with torsemide  at home. Because worsening symptoms she came to the hospital for further evaluation.    Denies any urinary symptoms, like hematuria or dysuria.   Review of Systems: As mentioned in the history of present illness. All other systems reviewed and are negative. Past Medical History:  Diagnosis Date   Anemia    Aortic stenosis    Arthritis    CHF (congestive heart failure) (HCC)    Coronary artery disease    Diabetes mellitus without complication (HCC)    type 2   GERD (gastroesophageal reflux disease)    Heart murmur    History of blood transfusion    Hypercholesteremia    Hypertension    Hypothyroidism    Mitral regurgitation    Myocardial infarction (HCC)    Peripheral vascular disease (HCC)    PONV (postoperative nausea and vomiting)    Thyroid disease    Past Surgical History:  Procedure Laterality Date   ABDOMINAL AORTOGRAM W/LOWER EXTREMITY Bilateral 01/14/2020    Procedure: ABDOMINAL AORTOGRAM W/LOWER EXTREMITY;  Surgeon: Gretta Lonni PARAS, MD;  Location: [facility];  Service: Cardiovascular;  Laterality: Bilateral;   ABDOMINAL AORTOGRAM W/LOWER EXTREMITY N/A 07/05/2022   Procedure: ABDOMINAL AORTOGRAM W/LOWER EXTREMITY;  Surgeon: Gretta Lonni PARAS, MD;  Location: [facility];  Service: Cardiovascular;  Laterality: N/A;   ABDOMINAL AORTOGRAM W/LOWER EXTREMITY N/A 08/02/2022   Procedure: ABDOMINAL AORTOGRAM W/LOWER EXTREMITY;  Surgeon: Gretta Lonni PARAS, MD;  Location: [facility];  Service: Cardiovascular;  Laterality: N/A;   ABDOMINAL AORTOGRAM W/LOWER EXTREMITY N/A 08/06/2022   Procedure: ABDOMINAL AORTOGRAM W/LOWER EXTREMITY;  Surgeon: Gretta Lonni PARAS, MD;  Location: [facility];  Service: Cardiovascular;  Laterality: N/A;   AMPUTATION Bilateral 07/20/2022   Procedure: AMPUTATION RIGHT GREAT TOE AND SECOND TOE, AMPUTATION LEFT GREAT TOE;  Surgeon: Harden Jerona GAILS, MD;  Location: [facility] OR;  Service: Orthopedics;  Laterality: Bilateral;   AMPUTATION Bilateral 08/08/2022   Procedure: BILATERAL TRANSMETATARSAL AMPUTATION;  Surgeon: Harden Jerona GAILS, MD;  Location: [facility] OR;  Service: Orthopedics;  Laterality: Bilateral;   AMPUTATION Left 10/22/2023   Procedure: AMPUTATION BELOW KNEE;  Surgeon: Lanis Fonda BRAVO, MD;  Location: [facility] OR;  Service: Vascular;  Laterality: Left;   AMPUTATION TOE     BIOPSY  04/06/2023   Procedure: BIOPSY;  Surgeon: Haniya Elspeth SQUIBB, MD;  Location: [facility] ENDOSCOPY;  Service: Gastroenterology;;   COLONOSCOPY WITH PROPOFOL  N/A 04/06/2023   Procedure: COLONOSCOPY WITH PROPOFOL ;  Surgeon: Seerat Elspeth SQUIBB, MD;  Location: [facility] ENDOS[facility];  Service:  Gastroenterology;  Laterality: N/A;   CYSTOSCOPY WITH STENT PLACEMENT N/A 11/30/2023   Procedure: CYSTOSCOPY, WITH FULGURATION OF BLEEDING AND CLOT EVACUATION;  Surgeon: Roseann Adine PARAS., MD;  Location: [facility] OR;  Service: Urology;  Laterality: N/A;  CLOT EVACUATION  OF BLADDER   ESOPHAGOGASTRODUODENOSCOPY (EGD) WITH PROPOFOL  N/A 04/06/2023   Procedure: ESOPHAGOGASTRODUODENOSCOPY (EGD) WITH PROPOFOL ;  Surgeon: Kathee Elspeth SQUIBB, MD;  Location: [facility] ENDOSCOPY;  Service: Gastroenterology;  Laterality: N/A;   GIVENS CAPSULE STUDY N/A 04/06/2023   Procedure: GIVENS CAPSULE STUDY;  Surgeon: Anavictoria Elspeth SQUIBB, MD;  Location: [facility] ENDOSCOPY;  Service: Gastroenterology;  Laterality: N/A;   PERIPHERAL VASCULAR ATHERECTOMY Right 01/14/2020   Procedure: PERIPHERAL VASCULAR ATHERECTOMY;  Surgeon: Gretta Lonni PARAS, MD;  Location: [facility] INVASIVE CV LAB;  Service: Cardiovascular;  Laterality: Right;  sfa   PERIPHERAL VASCULAR INTERVENTION Right 01/14/2020   Procedure: PERIPHERAL VASCULAR INTERVENTION;  Surgeon: Gretta Lonni PARAS, MD;  [facility];  Service: Cardiovascular;  Laterality: Right;  sfa stent    PERIPHERAL VASCULAR INTERVENTION  07/05/2022   Procedure: PERIPHERAL VASCULAR INTERVENTION;  Surgeon: Gretta Lonni PARAS, MD;  [facility];  Service: Cardiovascular;;   PERIPHERAL VASCULAR INTERVENTION  08/02/2022   Procedure: PERIPHERAL VASCULAR INTERVENTION;  Surgeon: Gretta Lonni PARAS, MD;  [facility];  Service: Cardiovascular;;   PERIPHERAL VASCULAR INTERVENTION  08/06/2022   Procedure: PERIPHERAL VASCULAR INTERVENTION;  Surgeon: Gretta Lonni PARAS, MD;  Location: [facility] INVASIVE CV LAB;  Service: Cardiovascular;;   PERIPHERAL VASCULAR THROMBECTOMY  08/02/2022   Procedure: PERIPHERAL VASCULAR THROMBECTOMY;  Surgeon: Gretta Lonni PARAS, MD;  [facility];  Service: Cardiovascular;;   REVISION AMPUTATION, BELOW THE KNEE Left 10/23/2023   Procedure: REVISION AMPUTATION, BELOW THE KNEE;  Surgeon: Harden Jerona GAILS, MD;  Location: [facility] OR;  Service: Orthopedics;  Laterality: Left;  REVISION LEFT BELOW KNEE AMPUTATION   RIGHT HEART CATH N/A 11/29/2023   Procedure: RIGHT HEART CATH;  Surgeon: Rolan Ezra RAMAN, MD;   Location: [facility] INVASIVE CV LAB;  Service: Cardiovascular;  Laterality: N/A;   RIGHT/LEFT HEART CATH AND CORONARY ANGIOGRAPHY N/A 08/23/2022   Procedure: RIGHT/LEFT HEART CATH AND CORONARY ANGIOGRAPHY;  Surgeon: Dann Candyce RAMAN, MD;  Location: [facility] INVASIVE CV LAB;  Service: Cardiovascular;  Laterality: N/A;   SKIN SPLIT GRAFT Right 03/18/2020   Procedure: SKIN GRAFTING RIGHT FOOT ULCER;  Surgeon: Harden Jerona GAILS, MD;  Location: [facility] OR;  Service: Orthopedics;  Laterality: Right;   STUMP REVISION Right 11/30/2022   Procedure: REVISION RIGHT TRANSMETATARSAL AMPUTATION;  Surgeon: Harden Jerona GAILS, MD;  Location: [facility] OR;  Service: Orthopedics;  Laterality: Right;   TEE WITHOUT CARDIOVERSION N/A 08/27/2022   Procedure: TRANSESOPHAGEAL ECHOCARDIOGRAM;  Surgeon: Hobart Powell BRAVO, MD;  Location: [facility] INVASIVE CV LAB;  Service: Cardiovascular;  Laterality: N/A;   TRANSESOPHAGEAL ECHOCARDIOGRAM (CATH LAB) N/A 10/24/2023   Procedure: TRANSESOPHAGEAL ECHOCARDIOGRAM;  Surgeon: Michele Richardson, DO;  [facility]: [facility];  Service: Cardiovascular;  Laterality: N/A;   WISDOM TOOTH EXTRACTION     Social History:  reports that she quit smoking about 6 years ago. Her smoking use included cigarettes. She has never used smokeless tobacco. She reports that she does not currently use alcohol. She reports current drug use. Drug: Marijuana.  Allergies  Allergen Reactions   Trental  [Pentoxifylline ] Nausea And Vomiting   Vibramycin [Doxycycline] Nausea And Vomiting    Family History  Problem Relation Age of Onset   Diabetes Other    CAD Other  Prior to Admission medications   Medication Sig Start Date End Date Taking? Authorizing Provider  acetaminophen  (TYLENOL ) 500 MG tablet Take 1,000 mg by mouth 2 (two) times daily as needed for moderate pain (pain score 4-6), headache or fever.    [provider]  Alcohol Swabs  (PHARMACIST CHOICE ALCOHOL) PADS Apply topically. 04/09/23   [provider]   aspirin  81 MG EC tablet Take 81 mg by mouth daily.    [provider]  atorvastatin  (LIPITOR) 40 MG tablet Take 40 mg by mouth daily. 12/14/19   [provider]  bisacodyl  (DULCOLAX) 10 MG suppository Place 1 suppository (10 mg total) rectally daily as needed for moderate constipation or mild constipation. 10/29/23   Hongalgi, Anand D, MD  clopidogrel  (PLAVIX ) 75 MG tablet TAKE 1 TABLET BY MOUTH EVERY DAY WITH BREAKFAST 01/28/23   Sheree Penne Bruckner, MD  diphenhydrAMINE  HCl, Sleep, (ZZZQUIL) 25 MG CAPS Take 25 mg by mouth at bedtime as needed (Sleep).    [provider]  ferrous sulfate  325 (65 FE) MG EC tablet Take 1 tablet (325 mg total) by mouth daily with breakfast. 04/07/23 04/06/24  Fairy Frames, MD  gabapentin  (NEURONTIN ) 300 MG capsule Take 300 mg by mouth at bedtime.    [provider]  insulin  degludec (TRESIBA  FLEXTOUCH) 100 UNIT/ML FlexTouch Pen Inject 6 Units into the skin at bedtime. 11/27/23   Zylah Elsbernd Daniel, MD  levothyroxine  (SYNTHROID ) 150 MCG tablet Take 150 mcg by mouth daily before breakfast. 12/02/19   [provider]  midodrine  (PROAMATINE ) 5 MG tablet Take 1 tablet (5 mg total) by mouth 3 (three) times daily with meals. 12/06/23   Rachella Basden Daniel, MD  Multiple Vitamin (MULTIVITAMIN WITH MINERALS) TABS tablet Take 1 tablet by mouth daily. 11/28/23   Harjot Dibello Daniel, MD  nitroGLYCERIN  (NITROSTAT ) 0.4 MG SL tablet Place 1 tablet (0.4 mg total) under the tongue every 5 (five) minutes x 3 doses as needed for chest pain (if no relief after 3rd dose proceed to ED or call 911). 11/06/2022-New 12/09/23   Miriam Norris, NP  pantoprazole  (PROTONIX ) 40 MG tablet Take 40 mg by mouth daily. 12/21/19   [provider]  polyethylene glycol (MIRALAX  / GLYCOLAX ) 17 g packet Take 17 g by mouth 2 (two) times daily. 10/29/23   Hongalgi, Anand D, MD  promethazine  (PHENERGAN ) 25 MG tablet Take 1 tablet (25 mg total) by mouth  every 6 (six) hours as needed for nausea or vomiting. 10/15/23   Francesca Elsie CROME, MD  senna-docusate (SENOKOT-S) 8.6-50 MG tablet Take 1 tablet by mouth 2 (two) times daily. 10/29/23   Hongalgi, Anand D, MD  torsemide  (DEMADEX ) 20 MG tablet Take 4 tablets (80 mg total) by mouth 2 (two) times daily. 11/27/23   Noralee Elidia Sieving, MD    Physical Exam: Vitals:   12/15/23 0958 12/15/23 0959 12/15/23 1000 12/15/23 1045  BP:   (!) 104/57 (!) 96/56  Pulse:   (!) 103 (!) 101  Resp:   (!) 29 (!) 23  Temp:  98.4 F (36.9 C)    TempSrc:  Oral    SpO2: 94%  95% 97%  Weight:      Height:       BP (!) 109/50   Pulse 98   Temp 98 F (36.7 C) (Oral)   Resp (!) 24   Ht 5' 4 (1.626 m)   Wt 86.1 kg   SpO2 94%   BMI 32.58 kg/m   Neurology awake  and alert ENT with mild pallor with no icterus Cardiovascular with S1 and S2 present and regular with no gallops or rubs, positive crescendo decrescendo systolic murmur at the base Moderate JVD Respiratory with bilateral rales with no wheezing or rhonchi,  Abdomen with no distention, soft and non tender Left BKA Right lower extremity edema ++ pitting   Data Reviewed:   Na 131, K 3.6 Cl 95 bicarbonate 22 glucose 273 bun 63 cr 1,70  BNP 1,108  High sensitive troponin 89  Lactic acid 1,9 and 1,4  Wbc 7,1 hgb 7,2 plt 271   Chest radiograph with cardiomegaly, bilateral hilar vascular congestion, bilateral central interstitial infiltrates with bilateral pleural effusions.   EKG 103 bpm, normal axis, qtc 490, right bundle branch block, sinus rhythm with poor RR wave progression, with no significant  ST segment or T wave changes.   Assessment and Plan: * Acute on chronic systolic (congestive) heart failure (HCC) Echocardiogram with reduced LV systolic function EF 25 to 30%, global hypokinesis, mild dilatated LV cavity, mild to moderate mitral regurgitation, moderate to severe aortic stenosis.   Plan to continue diuresis with IV furosemide  60  mg bid  Continue blood pressure support with midodrine .  Limited medical therapy due to risk of hypotension.  Strict in and outs, daily weights.   CAD (coronary artery disease) High sensitive troponin elevation due to heart failure decompensation, no acute coronary syndrome.   No chest pain, continue aspirin  and clopidogrel .  Continue statin.   Hypertension Continue blood pressure support with midodrine    Chronic kidney disease, stage 3a (HCC) Hyponatremia   Renal function with stable serum cr  Continue diuresis for hypervolemia.  Follow up renal function and electrolytes, avoid hypotension or nephrotoxic agents   Type 2 diabetes mellitus with hyperlipidemia (HCC) Uncontrolled hyperglycemia.   Continue insulin  sliding scale for glucose cover and monitoring  Continue statin therapy   Chronic anemia Patient will have one unit PRBC ordered per ED for symptomatic anemia Iron  panel with serum iron  at 33, with TIBC 273 and transferrin saturation 12, with ferritin 388.  Patient had severe anemia on prior hospitalization due to hematuria.  Follow up cell count in am.   PVD (peripheral vascular disease) (HCC) Sp left BKA Continue dual antiplatelet therapy and statin   GERD (gastroesophageal reflux disease) Continue pantoprazole    Hypothyroidism Continue levothyroxine    Obesity, class 1 Calculated BMI is 32.5       Advance Care Planning:   Code Status: Full Code   Consults: none   Family Communication: I spoke with patient's mother at the bedside, we talked in detail about patient's condition, plan of care and prognosis and all questions were addressed.   Severity of Illness: The appropriate patient status for this patient is INPATIENT. Inpatient status is judged to be reasonable and necessary in order to provide the required intensity of service to ensure the patient's safety. The patient's presenting symptoms, physical exam findings, and initial radiographic and  laboratory data in the context of their chronic comorbidities is felt to place them at high risk for further clinical deterioration. Furthermore, it is not anticipated that the patient will be medically stable for discharge from the hospital within 2 midnights of admission.   * I certify that at the point of admission it is my clinical judgment that the patient will require inpatient hospital care spanning beyond 2 midnights from the point of admission due to high intensity of service, high risk for further deterioration and high frequency of  surveillance required.*  Author: Elidia Toribio Furnace, MD 12/15/2023 11:47 AM  For on call review [email or web address].

## 2023-12-15 NOTE — Plan of Care (Signed)

## 2023-12-15 NOTE — Progress Notes (Incomplete)
   12/15/23 2010  Provider Notification  Provider Name/Title Dr. Lawence  Date Provider Notified 12/15/23  Time Provider Notified 2010  Method of Notification Page  Notification Reason Change in status  Provider response No new orders

## 2023-12-15 NOTE — Assessment & Plan Note (Signed)
 Continue levothyroxine 

## 2023-12-15 NOTE — Assessment & Plan Note (Signed)
Calculated BMI is 32.5  

## 2023-12-15 NOTE — Assessment & Plan Note (Signed)
 Uncontrolled hyperglycemia.   Continue insulin  sliding scale for glucose cover and monitoring  Continue statin therapy

## 2023-12-16 ENCOUNTER — Encounter (HOSPITAL_COMMUNITY)

## 2023-12-16 DIAGNOSIS — I25118 Atherosclerotic heart disease of native coronary artery with other forms of angina pectoris: Secondary | ICD-10-CM | POA: Diagnosis not present

## 2023-12-16 DIAGNOSIS — I5023 Acute on chronic systolic (congestive) heart failure: Secondary | ICD-10-CM | POA: Diagnosis not present

## 2023-12-16 DIAGNOSIS — N1831 Chronic kidney disease, stage 3a: Secondary | ICD-10-CM | POA: Diagnosis not present

## 2023-12-16 DIAGNOSIS — I10 Essential (primary) hypertension: Secondary | ICD-10-CM | POA: Diagnosis not present

## 2023-12-16 LAB — CBC
HCT: 23.7 % — ABNORMAL LOW (ref 36.0–46.0)
Hemoglobin: 7.5 g/dL — ABNORMAL LOW (ref 12.0–15.0)
MCH: 30.9 pg (ref 26.0–34.0)
MCHC: 31.6 g/dL (ref 30.0–36.0)
MCV: 97.5 fL (ref 80.0–100.0)
Platelets: 255 K/uL (ref 150–400)
RBC: 2.43 MIL/uL — ABNORMAL LOW (ref 3.87–5.11)
RDW: 17.7 % — ABNORMAL HIGH (ref 11.5–15.5)
WBC: 6.8 K/uL (ref 4.0–10.5)
nRBC: 0 % (ref 0.0–0.2)

## 2023-12-16 LAB — BASIC METABOLIC PANEL WITH GFR
Anion gap: 12 (ref 5–15)
BUN: 69 mg/dL — ABNORMAL HIGH (ref 6–20)
CO2: 25 mmol/L (ref 22–32)
Calcium: 8.8 mg/dL — ABNORMAL LOW (ref 8.9–10.3)
Chloride: 98 mmol/L (ref 98–111)
Creatinine, Ser: 1.71 mg/dL — ABNORMAL HIGH (ref 0.44–1.00)
GFR, Estimated: 35 mL/min — ABNORMAL LOW (ref >60)
Glucose, Bld: 197 mg/dL — ABNORMAL HIGH (ref 70–99)
Potassium: 3.3 mmol/L — ABNORMAL LOW (ref 3.5–5.1)
Sodium: 135 mmol/L (ref 135–145)

## 2023-12-16 LAB — GLUCOSE, CAPILLARY
Glucose-Capillary: 182 mg/dL — ABNORMAL HIGH (ref 70–99)
Glucose-Capillary: 197 mg/dL — ABNORMAL HIGH (ref 70–99)
Glucose-Capillary: 188 mg/dL — ABNORMAL HIGH (ref 70–99)
Glucose-Capillary: 200 mg/dL — ABNORMAL HIGH (ref 70–99)

## 2023-12-16 LAB — MRSA NEXT GEN BY PCR, NASAL: MRSA by PCR Next Gen: NOT DETECTED

## 2023-12-16 MED ORDER — INSULIN GLARGINE-YFGN 100 UNIT/ML ~~LOC~~ SOLN
6.0000 [IU] | SUBCUTANEOUS | Status: DC
  Administered 2023-12-16 – 2023-12-18 (×3): 6 [IU] via SUBCUTANEOUS
  Filled 2023-12-16 (×4): qty 0.06

## 2023-12-16 MED ORDER — INSULIN ASPART 100 UNIT/ML IJ SOLN
0.0000 [IU] | Freq: Three times a day (TID) | INTRAMUSCULAR | Status: DC
  Administered 2023-12-16 (×2): 2 [IU] via SUBCUTANEOUS
  Administered 2023-12-17: 5 [IU] via SUBCUTANEOUS
  Administered 2023-12-17 – 2023-12-18 (×3): 2 [IU] via SUBCUTANEOUS

## 2023-12-16 MED ORDER — POTASSIUM CHLORIDE CRYS ER 20 MEQ PO TBCR
40.0000 meq | EXTENDED_RELEASE_TABLET | ORAL | Status: AC
  Administered 2023-12-16 (×2): 40 meq via ORAL
  Filled 2023-12-16 (×2): qty 2

## 2023-12-16 NOTE — Progress Notes (Signed)
 Progress Note   Patient: Kayla Schmitt FMW:[id number] DOB: [birth year] DOA: 12/15/2023     1 DOS: the patient was seen and examined on 12/16/2023   Brief hospital course: Kayla Schmitt was admitted to the hospital with the working diagnosis of heart failure exacerbation.    58 y.o. 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  Recent hospitalization in [facility] 07/15 to 12/06/23 for heart failure exacerbation, complicated with cardiogenic shock, AKI and urinary tract infection with severe hematuria. She was discharged home with instructions to take torsemide  80 mg po bid.    Patient was doing well until 48 hrs prior to admission when she noticed persistent and worsening dyspnea. Initially with exertion and then at rest with minimal efforts, associated with PND, orthopnea and right lower extremity edema.   On her initial physical examination her blood pressure was 104/57, HR 103, RR 29 and 02 saturation 94%  Cardiovascular with S1 and S2 present and regular with no gallops or rubs, positive crescendo decrescendo systolic murmur at the base Moderate JVD Respiratory with bilateral rales with no wheezing or rhonchi,  Abdomen with no distention, soft and non tender Left BKA Right lower extremity edema ++ pitting   Na 131, K 3.6 Cl 95 bicarbonate 22 glucose 273 bun 63 cr 1,70  BNP 1,108  High sensitive troponin 89  Lactic acid 1,9 and 1,4  Wbc 7,1 hgb 7,2 plt 271    Chest radiograph with cardiomegaly, bilateral hilar vascular congestion, bilateral central interstitial infiltrates with bilateral pleural effusions.    EKG 103 bpm, normal axis, qtc 490, right bundle branch block, sinus rhythm with poor RR wave progression, with no significant  ST segment or T wave changes.   Patient was placed on furosemide  and had one unit PRBC transfusion.   08/18 clinically improving, continue  diuresing   Assessment and Plan: * Acute on chronic systolic (congestive) heart failure (HCC) Echocardiogram with reduced LV systolic function EF 25 to 30%, global hypokinesis, mild dilatated LV cavity, mild to moderate mitral regurgitation, moderate to severe aortic stenosis.   Documented urine output is 850 ml Lost 1 kg from yesterday  Systolic blood pressure 116 mmHg.   Plan to continue diuresis with IV furosemide  60 mg bid  Continue blood pressure support with midodrine .  Limited medical therapy due to risk of hypotension.   CAD (coronary artery disease) High sensitive troponin elevation due to heart failure decompensation, no acute coronary syndrome.   No chest pain, continue aspirin  and clopidogrel .  Continue statin.   Hypertension Continue blood pressure support with midodrine    Chronic kidney disease, stage 3a (HCC) Hyponatremia hypokalemia   Renal function with serum cr at 1,71 with K at 3,3 and serum bicarbonate at 25  Na 135  Continue diuresis with furosemide , add Kcl 40 meq x2 and follow up renal function and electrolytes.     Type 2 diabetes mellitus with hyperlipidemia (HCC) Uncontrolled hyperglycemia.   Continue insulin  sliding scale for glucose cover and monitoring  Basal insulin  therapy   Continue statin therapy   Chronic anemia Iron  panel with serum iron  at 33, with TIBC 273 and transferrin saturation 12, with ferritin 388.  Patient had severe anemia on prior hospitalization due to hematuria.   08/17 PRBC transfusion x1   Follow up hgb is 7,5   PVD (peripheral vascular disease) (HCC) Sp left BKA Continue dual antiplatelet therapy and statin   GERD (gastroesophageal reflux  disease) Continue pantoprazole    Hypothyroidism Continue levothyroxine    Obesity, class 1 Calculated BMI is 32.5     Subjective: Patient with improvement on dyspnea and edema, not yet back to baseline, no chest pain. Yesterday required in and out bladder  catheterization, but this morning able to void with no difficulties.   Physical Exam: Vitals:   12/16/23 1000 12/16/23 1100 12/16/23 1257 12/16/23 1300  BP: (!) 111/51 116/72  106/63  Pulse: 85 86  87  Resp: 20 (!) 23  (!) 22  Temp:   98.4 F (36.9 C)   TempSrc:   Oral   SpO2: 100% 100%  100%  Weight:      Height:       Neurology awake and alert ENT with mild pallor with no icterus Cardiovascular with S1 and S2 present and regular with no gallops, positive systolic murmur at the base, crescendo decrescendo  No JVD Respiratory with rales at bases with no wheezing or rhonchi  Abdomen with no distention  Right lower extremity edema +  Left BKA   Data Reviewed:    Family Communication: I spoke with patient's mother at the bedside, we talked in detail about patient's condition, plan of care and prognosis and all questions were addressed.   Disposition: Status is: Inpatient Remains inpatient appropriate because: IV diuresis   Planned Discharge Destination: Home    Author: Elidia Toribio Furnace, MD 12/16/2023 2:01 PM  For on call review [email or web address].

## 2023-12-16 NOTE — Plan of Care (Signed)

## 2023-12-16 NOTE — Hospital Course (Signed)
 Mrs. Kayla Schmitt was admitted to the hospital with the working diagnosis of heart failure exacerbation.    58 y.o. 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  Recent hospitalization in [facility] 07/15 to 12/06/23 for heart failure exacerbation, complicated with cardiogenic shock, AKI and urinary tract infection with severe hematuria. She was discharged home with instructions to take torsemide  80 mg po bid.    Patient was doing well until 48 hrs prior to admission when she noticed persistent and worsening dyspnea. Initially with exertion and then at rest with minimal efforts, associated with PND, orthopnea and right lower extremity edema.   On her initial physical examination her blood pressure was 104/57, HR 103, RR 29 and 02 saturation 94%  Cardiovascular with S1 and S2 present and regular with no gallops or rubs, positive crescendo decrescendo systolic murmur at the base Moderate JVD Respiratory with bilateral rales with no wheezing or rhonchi,  Abdomen with no distention, soft and non tender Left BKA Right lower extremity edema ++ pitting   Na 131, K 3.6 Cl 95 bicarbonate 22 glucose 273 bun 63 cr 1,70  BNP 1,108  High sensitive troponin 89  Lactic acid 1,9 and 1,4  Wbc 7,1 hgb 7,2 plt 271    Chest radiograph with cardiomegaly, bilateral hilar vascular congestion, bilateral central interstitial infiltrates with bilateral pleural effusions.    EKG 103 bpm, normal axis, qtc 490, right bundle branch block, sinus rhythm with poor RR wave progression, with no significant  ST segment or T wave changes.   Patient was placed on furosemide  and had one unit PRBC transfusion.   08/18 clinically improving, continue diuresing

## 2023-12-16 NOTE — TOC Initial Note (Signed)
 Transition of Care [facility]) - Initial/Assessment Note    Patient Details  Name: Kayla Schmitt MRN: [id number] Date of Birth: 1965-06-15  Transition of Care [facility]) CM/SW Contact:    Sharlyne Stabs, RN Phone Number: 12/16/2023, 12:47 PM  Clinical Narrative:   Patient admitted with Acute on chronic systolic heart failure. Considered to be a high risk for readmission. Recently assessed. Completed CIR at [facility], has all DME needed in the home. Active with [facility] for HHPT/OT/RN. Uses home oxygen  from [employer]. TOC following.                 Expected Discharge Plan: Home w Home Health Services Barriers to Discharge: Continued Medical Work up   Patient Goals and CMS Choice Patient states their goals for this hospitalization and ongoing recovery are:: Return home CMS Medicare.gov Compare Post Acute Care list provided to:: Patient Choice offered to / list presented to : Patient [place] ownership interest in [facility].provided to:: Patient    Expected Discharge Plan and Services      Living arrangements for the past 2 months: Single Family Home                      Prior Living Arrangements/Services Living arrangements for the past 2 months: Single Family Home Lives with:: Self Patient language and need for interpreter reviewed:: Yes        Need for Family Participation in Patient Care: Yes (Comment) Care giver support system in place?: Yes (comment) Current home services: DME, Home PT, Home OT, Home RN Criminal Activity/Legal Involvement Pertinent to Current Situation/Hospitalization: No - Comment as needed  Activities of Daily Living   ADL Screening (condition at time of admission) Independently performs ADLs?: No Does the patient have a NEW difficulty with bathing/dressing/toileting/self-feeding that is expected to last >3 days?: No Does the patient have a NEW difficulty with getting in/out of bed, walking, or climbing stairs that is expected to last >3 days?:  Yes (Initiates electronic notice to provider for possible PT consult) Does the patient have a NEW difficulty with communication that is expected to last >3 days?: No Is the patient deaf or have difficulty hearing?: No Does the patient have difficulty seeing, even when wearing glasses/contacts?: No Does the patient have difficulty concentrating, remembering, or making decisions?: No  Permission Sought/Granted                  Emotional Assessment     Affect (typically observed): Accepting Orientation: : Oriented to Self, Oriented to Place, Oriented to  Time, Oriented to Situation Alcohol / Substance Use: Not Applicable Psych Involvement: No (comment)  Admission diagnosis:  Acute pulmonary edema (HCC) [J81.0] Acute on chronic systolic (congestive) heart failure (HCC) [I50.23] Acute congestive heart failure, unspecified heart failure type (HCC) [I50.9] Patient Active Problem List   Diagnosis Date Noted   Acute on chronic systolic (congestive) heart failure (HCC) 12/15/2023   Chronic anemia 12/15/2023   Gross hematuria 11/30/2023   Clot retention of urine 11/30/2023   Advanced care planning/counseling discussion 11/29/2023   Goals of care, counseling/discussion 11/29/2023   Palliative care by specialist 11/29/2023   Chronic kidney disease, stage 3a (HCC) 11/23/2023   Cardiogenic shock (HCC) 11/16/2023   Acute respiratory failure with hypoxia (HCC) 11/13/2023   Anxiety state 11/12/2023   Acute on chronic systolic CHF (congestive heart failure) (HCC) 11/12/2023   Pressure injury of skin 11/11/2023   Unilateral complete BKA, left, sequela (HCC) 10/29/2023   S/P  BKA (below knee amputation) unilateral, left (HCC) 10/29/2023   Necrotizing fasciitis (HCC) 10/22/2023   Gangrene of left foot (HCC) 10/21/2023   MRSA bacteremia 10/21/2023   Sepsis (HCC) 10/20/2023   AKI (acute kidney injury) (HCC) 10/20/2023   Diffuse pain in left lower extremity 10/20/2023   History of GI bleed  10/20/2023   Elevated troponin 10/20/2023   Hypokalemia 10/20/2023   History of coronary artery disease 10/20/2023   Prolonged QT interval 10/20/2023   AVM (arteriovenous malformation) of small bowel, acquired 04/07/2023   Antiplatelet or antithrombotic long-term use 04/04/2023   Iron  deficiency anemia 04/04/2023   Acute on chronic diastolic heart failure (HCC) 04/03/2023   Osteomyelitis of foot, right, acute (HCC) 12/12/2022   CAD (coronary artery disease) 11/06/2022   Aortic stenosis 11/06/2022   Mitral regurgitation 08/27/2022   NSTEMI (non-ST elevated myocardial infarction) (HCC) 08/18/2022   Acute clinical systolic heart failure (HCC) 08/18/2022   Acute blood loss anemia 08/18/2022   Anemia 08/18/2022   Critical limb ischemia of both lower extremities (HCC) 06/19/2022   Type 2 diabetes mellitus with hyperlipidemia (HCC) 01/28/2020   Hyperlipidemia 01/28/2020   Hypertension 01/28/2020   Hypothyroidism 01/28/2020   Obesity, class 1 01/28/2020   Ulcerated, foot, right, limited to breakdown of skin (HCC)    Cellulitis of right foot    PVD (peripheral vascular disease) (HCC)    Cellulitis 01/11/2020   Blister of leg 01/06/2020   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/28/2017   Chest pain 10/28/2017   GERD (gastroesophageal reflux disease) 10/28/2017   PCP:  [name], NP Pharmacy:   [facility] - [place], [place] - [address] [place] [address] Phone: [phone] Fax: [phone]  [facility] [address] [place] [address] Phone: [phone] Fax: [phone]     Social Drivers of Health (SDOH) Social History: SDOH Screenings   Food Insecurity: No Food Insecurity (12/15/2023)  Housing: Low Risk  (12/15/2023)  Transportation Needs: No Transportation Needs (12/15/2023)  Utilities: Not At Risk (12/15/2023)  Financial Resource Strain: Low Risk  (10/19/2023)   [facility] from [facility]   Social Connections: Unknown (12/15/2023)  Tobacco Use: Medium Risk (12/15/2023)   SDOH Interventions:     Readmission Risk Interventions    12/16/2023   12:45 PM 12/06/2023    1:45 PM 11/14/2023   10:15 AM  Readmission Risk Prevention Plan  Transportation Screening Complete Complete Complete  Medication Review Oceanographer) Complete Complete Complete  PCP or Specialist appointment within 3-5 days of discharge Not Complete Complete   HRI or Home Care Consult Complete Complete Complete  SW Recovery Care/Counseling Consult Complete    Palliative Care Screening Not Applicable Not Applicable Not Applicable  Skilled Nursing Facility Not Applicable Not Applicable Not Applicable

## 2023-12-16 NOTE — Inpatient Diabetes Management (Signed)
 Inpatient Diabetes Program Recommendations  AACE/ADA: New Consensus Statement on Inpatient Glycemic Control   Target Ranges:  Prepandial:   less than 140 mg/dL      Peak postprandial:   less than 180 mg/dL (1-2 hours)      Critically ill patients:  140 - 180 mg/dL    Latest Reference Range & Units 12/15/23 21:34 12/16/23 08:02  Glucose-Capillary 70 - 99 mg/dL 797 (H) 817 (H)   Review of Glycemic Control  Diabetes history: DM2 Outpatient Diabetes medications: Tresiba  6 units daily Current orders for Inpatient glycemic control: None; CBGs BID PRN  Inpatient Diabetes Program Recommendations:    Insulin : Please consider ordering CBGs AC&HS,  Novolog  0-15 units TID with meals and Novolog  0-5 units at bedtime.  Thanks, Earnie Gainer, RN, MSN, CDCES Diabetes Coordinator Inpatient Diabetes Program [phone] (Team Pager from 8am to 5pm)

## 2023-12-17 ENCOUNTER — Telehealth: Payer: Self-pay | Admitting: Nurse Practitioner

## 2023-12-17 ENCOUNTER — Telehealth: Payer: Self-pay | Admitting: Urology

## 2023-12-17 DIAGNOSIS — I5023 Acute on chronic systolic (congestive) heart failure: Secondary | ICD-10-CM | POA: Diagnosis not present

## 2023-12-17 LAB — GLUCOSE, CAPILLARY
Glucose-Capillary: 197 mg/dL — ABNORMAL HIGH (ref 70–99)
Glucose-Capillary: 252 mg/dL — ABNORMAL HIGH (ref 70–99)
Glucose-Capillary: 152 mg/dL — ABNORMAL HIGH (ref 70–99)
Glucose-Capillary: 199 mg/dL — ABNORMAL HIGH (ref 70–99)

## 2023-12-17 LAB — MAGNESIUM: Magnesium: 1.9 mg/dL (ref 1.7–2.4)

## 2023-12-17 LAB — BASIC METABOLIC PANEL WITH GFR
Anion gap: 10 (ref 5–15)
BUN: 76 mg/dL — ABNORMAL HIGH (ref 6–20)
CO2: 22 mmol/L (ref 22–32)
Calcium: 8.8 mg/dL — ABNORMAL LOW (ref 8.9–10.3)
Chloride: 102 mmol/L (ref 98–111)
Creatinine, Ser: 1.94 mg/dL — ABNORMAL HIGH (ref 0.44–1.00)
GFR, Estimated: 30 mL/min — ABNORMAL LOW (ref >60)
Glucose, Bld: 179 mg/dL — ABNORMAL HIGH (ref 70–99)
Potassium: 4.4 mmol/L (ref 3.5–5.1)
Sodium: 134 mmol/L — ABNORMAL LOW (ref 135–145)

## 2023-12-17 LAB — HEMOGLOBIN AND HEMATOCRIT, BLOOD
HCT: 23.5 % — ABNORMAL LOW (ref 36.0–46.0)
HCT: 27.4 % — ABNORMAL LOW (ref 36.0–46.0)
Hemoglobin: 6.7 g/dL — CL (ref 12.0–15.0)
Hemoglobin: 8.7 g/dL — ABNORMAL LOW (ref 12.0–15.0)

## 2023-12-17 LAB — URINE CULTURE

## 2023-12-17 LAB — PREPARE RBC (CROSSMATCH)

## 2023-12-17 MED ORDER — SODIUM CHLORIDE 0.9 % IV SOLN: 2.0000 g | Freq: Two times a day (BID) | INTRAVENOUS | Status: DC

## 2023-12-17 MED ORDER — MELATONIN 3 MG PO TABS
3.0000 mg | ORAL_TABLET | Freq: Every evening | ORAL | Status: DC | PRN
  Administered 2023-12-17: 3 mg via ORAL
  Filled 2023-12-17: qty 1

## 2023-12-17 MED ORDER — SODIUM CHLORIDE 0.9 % IV SOLN
2.0000 g | INTRAVENOUS | Status: DC
  Administered 2023-12-17: 2 g via INTRAVENOUS
  Filled 2023-12-17: qty 12.5

## 2023-12-17 MED ORDER — SODIUM CHLORIDE 0.9% IV SOLUTION: Freq: Once | INTRAVENOUS | Status: AC

## 2023-12-17 MED ORDER — FUROSEMIDE 10 MG/ML IJ SOLN
80.0000 mg | Freq: Two times a day (BID) | INTRAMUSCULAR | Status: DC
  Administered 2023-12-17 – 2023-12-18 (×2): 80 mg via INTRAVENOUS
  Filled 2023-12-17 (×2): qty 8

## 2023-12-17 NOTE — Telephone Encounter (Signed)
 While obtain auth for CT, I needed to call patient about changing location because insurance will not pay for [place], but will pay for [address]. Patient informed me that she can not have contrast with the CT at that time.   Need CT changed to without contrast. Need location changed to [address].   Patient wants to know if she still needs to have the CT done. States that her urine has been yellow and fine since coming to our office. Wants to know if it is still necessary.

## 2023-12-17 NOTE — Progress Notes (Addendum)
 [facility] PROGRESS NOTE  Kayla Schmitt (DOB: Jun 15, 1965) FMW:[id number] PCP: [name], NP  Brief Narrative: [name] was admitted to the hospital with the working diagnosis of heart failure exacerbation.     58 y.o. 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  Recent hospitalization in [facility] 07/15 to 12/06/23 for heart failure exacerbation, complicated with cardiogenic shock, AKI and urinary tract infection with severe hematuria. She was discharged home with instructions to take torsemide  80 mg po bid.    Patient was doing well until 48 hrs prior to admission when she noticed persistent and worsening dyspnea. Initially with exertion and then at rest with minimal efforts, associated with PND, orthopnea and right lower extremity edema.    On her initial physical examination her blood pressure was 104/57, HR 103, RR 29 and 02 saturation 94%  Cardiovascular with S1 and S2 present and regular with no gallops or rubs, positive crescendo decrescendo systolic murmur at the base Moderate JVD Respiratory with bilateral rales with no wheezing or rhonchi,  Abdomen with no distention, soft and non tender Left BKA Right lower extremity edema ++ pitting    Na 131, K 3.6 Cl 95 bicarbonate 22 glucose 273 bun 63 cr 1,70  BNP 1,108  High sensitive troponin 89  Lactic acid 1,9 and 1,4  Wbc 7,1 hgb 7,2 plt 271    Chest radiograph with cardiomegaly, bilateral hilar vascular congestion, bilateral central interstitial infiltrates with bilateral pleural effusions.    EKG 103 bpm, normal axis, qtc 490, right bundle branch block, sinus rhythm with poor RR wave progression, with no significant  ST segment or T wave changes.    Patient was placed on furosemide  and had one unit PRBC transfusion.    08/18 clinically improving, continue diuresing  8/19 recurrent anemia, 1u RBC,  augment diuresis  Subjective: No gross bleeding, still short of breath more than baseline. Denies hematuria.   Objective: BP 107/82   Pulse 80   Temp 98.1 F (36.7 C) (Oral)   Resp 20   Ht 5' 4 (1.626 m)   Wt 84.8 kg   SpO2 100%   BMI 32.09 kg/m   Gen: No distress Pulm: Crackles bilateral bases  CV: RRR, II/VI systolic murmur at RUSB, no gallop, ++ pitting dependent edema GI: Soft, NT, ND, +BS. Neuro: Alert and oriented. No new focal deficits. Ext: Warm, L BKA without new deformities. Skin: No new rashes, lesions or ulcers on visualized skin   Assessment & Plan: Acute on chronic systolic (congestive) heart failure (HCC) Echocardiogram with reduced LV systolic function EF 25 to 30%, global hypokinesis, mild dilatated LV cavity, mild to moderate mitral regurgitation, moderate to severe aortic stenosis.    - Continues to make progress, down 86kg > 85kg > 84kg.  - At home on torsemide  80mg  po BID, will augment IV lasix  to 80mg  BID.  - Continue midodrine , though hypotension still limits GDMT.    History of hematuria:  - Continue routine f/u with urology, Dr. Roseann.  - Planning CT as outpatient.  - Note +UCx on 8/14, will start cefepime   CAD (coronary artery disease) High sensitive troponin elevation due to heart failure decompensation, no acute coronary syndrome.    No chest pain, continue aspirin  and clopidogrel .  Continue statin.    Hypertension Continue blood pressure support with midodrine     Chronic kidney disease, stage 3a (HCC) Hyponatremia hypokalemia   Improved/stable.  Type 2 diabetes mellitus with hyperlipidemia (HCC) Uncontrolled hyperglycemia.    Continue insulin  sliding scale for glucose cover and monitoring  Basal insulin  therapy    Continue statin therapy    Chronic anemia Iron  panel with serum iron  at 33, with TIBC 273 and transferrin saturation 12, with ferritin 388.  Patient had severe anemia on prior hospitalization due to hematuria.  No ongoing bleeding/hematuria noted.    08/17 1u RBC  8/19 1u RBC  - Will continue DAPT given severity of vascular disease and no gross bleeding.   PVD (peripheral vascular disease) (HCC) Sp left BKA Continue dual antiplatelet therapy and statin    GERD (gastroesophageal reflux disease) Continue pantoprazole     Hypothyroidism Continue levothyroxine     Obesity, class 1: Body mass index is 32.09 kg/m.   Bernardino KATHEE Come, MD [facility] [email or web address] 12/17/2023, 2:26 PM

## 2023-12-17 NOTE — Progress Notes (Signed)
 Hgb is 6.7 this morning. Plan to transfuse 1 unit RBCs.

## 2023-12-17 NOTE — Plan of Care (Signed)
  Problem: Education: Goal: Knowledge of General Education information will improve Description: Including pain rating scale, medication(s)/side effects and non-pharmacologic comfort measures Outcome: Progressing   Problem: Health Behavior/Discharge Planning: Goal: Ability to manage health-related needs will improve Outcome: Progressing   Problem: Clinical Measurements: Goal: Ability to maintain clinical measurements within normal limits will improve Outcome: Progressing Goal: Will remain free from infection Outcome: Progressing Goal: Diagnostic test results will improve Outcome: Progressing Goal: Respiratory complications will improve Outcome: Progressing Goal: Cardiovascular complication will be avoided Outcome: Progressing   Problem: Activity: Goal: Risk for activity intolerance will decrease Outcome: Progressing   Problem: Nutrition: Goal: Adequate nutrition will be maintained Outcome: Progressing   Problem: Coping: Goal: Level of anxiety will decrease Outcome: Progressing   Problem: Elimination: Goal: Will not experience complications related to bowel motility Outcome: Progressing Goal: Will not experience complications related to urinary retention Outcome: Progressing   Problem: Pain Managment: Goal: General experience of comfort will improve and/or be controlled Outcome: Progressing   Problem: Safety: Goal: Ability to remain free from injury will improve Outcome: Progressing   Problem: Skin Integrity: Goal: Risk for impaired skin integrity will decrease Outcome: Progressing   Problem: Education: Goal: Ability to describe self-care measures that may prevent or decrease complications (Diabetes Survival Skills Education) will improve Outcome: Progressing Goal: Individualized Educational Video(s) Outcome: Progressing

## 2023-12-17 NOTE — Telephone Encounter (Signed)
 Noted, will FYI provider

## 2023-12-17 NOTE — Telephone Encounter (Signed)
 Pts mother came into [place] office and stated Mrs.Wisman is in the hospital and they have done all her lab work that Almarie has asked for.

## 2023-12-18 ENCOUNTER — Other Ambulatory Visit: Payer: Self-pay | Admitting: Urology

## 2023-12-18 ENCOUNTER — Ambulatory Visit: Payer: Self-pay | Admitting: Urology

## 2023-12-18 DIAGNOSIS — R310 Gross hematuria: Secondary | ICD-10-CM

## 2023-12-18 LAB — BASIC METABOLIC PANEL WITH GFR
Anion gap: 13 (ref 5–15)
BUN: 79 mg/dL — ABNORMAL HIGH (ref 6–20)
CO2: 22 mmol/L (ref 22–32)
Calcium: 8.8 mg/dL — ABNORMAL LOW (ref 8.9–10.3)
Chloride: 100 mmol/L (ref 98–111)
Creatinine, Ser: 1.87 mg/dL — ABNORMAL HIGH (ref 0.44–1.00)
GFR, Estimated: 31 mL/min — ABNORMAL LOW (ref >60)
Glucose, Bld: 161 mg/dL — ABNORMAL HIGH (ref 70–99)
Potassium: 3.7 mmol/L (ref 3.5–5.1)
Sodium: 135 mmol/L (ref 135–145)

## 2023-12-18 LAB — TYPE AND SCREEN
ABO/RH(D): O POS
Antibody Screen: NEGATIVE
Unit division: 0
Unit division: 0

## 2023-12-18 LAB — CBC
HCT: 27.6 % — ABNORMAL LOW (ref 36.0–46.0)
Hemoglobin: 8.4 g/dL — ABNORMAL LOW (ref 12.0–15.0)
MCH: 29.7 pg (ref 26.0–34.0)
MCHC: 30.4 g/dL (ref 30.0–36.0)
MCV: 97.5 fL (ref 80.0–100.0)
Platelets: 271 K/uL (ref 150–400)
RBC: 2.83 MIL/uL — ABNORMAL LOW (ref 3.87–5.11)
RDW: 17.4 % — ABNORMAL HIGH (ref 11.5–15.5)
WBC: 6 K/uL (ref 4.0–10.5)
nRBC: 0 % (ref 0.0–0.2)

## 2023-12-18 LAB — BPAM RBC
Blood Product Expiration Date: 202509172359
Blood Product Expiration Date: 202509162359
ISSUE DATE / TIME: 202508190916
ISSUE DATE / TIME: 202508171541
Unit Type and Rh: 5100
Unit Type and Rh: 5100

## 2023-12-18 LAB — GLUCOSE, CAPILLARY: Glucose-Capillary: 156 mg/dL — ABNORMAL HIGH (ref 70–99)

## 2023-12-18 MED ORDER — FERROUS SULFATE 325 (65 FE) MG PO TBEC: 325.0000 mg | DELAYED_RELEASE_TABLET | Freq: Two times a day (BID) | ORAL | 0 refills | Status: AC

## 2023-12-18 NOTE — Progress Notes (Addendum)
 Nurse at bedside,patient alert and oriented times four.No c/o pain or discomfort noted.Blood pressure 102/63,heart rate 85,Dr Bernardino Come notified.Plan of care on going.

## 2023-12-18 NOTE — TOC Transition Note (Signed)
 Transition of Care [facility]) - Discharge Note   Patient Details  Name: Kayla Schmitt MRN: [id number] Date of Birth: [birth year]  Transition of Care [facility]) CM/SW Contact:  Sharlyne Stabs, RN Phone Number: 12/18/2023, 9:58 AM   Clinical Narrative:   Discharging home today with [facility] home health PT/OT. Dorothe updated, MD placed new orders.     Barriers to Discharge: Barriers Resolved   Patient Goals and CMS Choice Patient states their goals for this hospitalization and ongoing recovery are:: Return home CMS Medicare.gov Compare Post Acute Care list provided to:: Patient Choice offered to / list presented to : Patient [place] ownership interest in [facility].provided to:: Patient    Discharge Placement    Patient and family notified of of transfer: 12/18/23  Discharge Plan and Services Additional resources added to the After Visit Summary for        [facility] Agency: [facility]   Social Drivers of Health (SDOH) Interventions SDOH Screenings   Food Insecurity: No Food Insecurity (12/15/2023)  Housing: Low Risk  (12/15/2023)  Transportation Needs: No Transportation Needs (12/15/2023)  Utilities: Not At Risk (12/15/2023)  Financial Resource Strain: Low Risk  (10/19/2023)   Received from [facility]  Social Connections: Unknown (12/15/2023)  Tobacco Use: Medium Risk (12/15/2023)     Readmission Risk Interventions    12/16/2023   12:45 PM 12/06/2023    1:45 PM 11/14/2023   10:15 AM  Readmission Risk Prevention Plan  Transportation Screening Complete Complete Complete  Medication Review Oceanographer) Complete Complete Complete  PCP or Specialist appointment within 3-5 days of discharge Not Complete Complete   HRI or Home Care Consult Complete Complete Complete  SW Recovery Care/Counseling Consult Complete    Palliative Care Screening Not Applicable Not Applicable Not Applicable  Skilled Nursing Facility Not Applicable Not Applicable Not Applicable

## 2023-12-18 NOTE — Progress Notes (Signed)
Patient discharged home with instructions given on medications and follow up visits,verbalized understanding .Prescriptions sent to Pharmacy of choice documented on AVS.IV discontinued,catheter intact. Accompanied by staff to an awaiting vehicle.

## 2023-12-18 NOTE — Discharge Summary (Signed)
 Physician Discharge Summary   Patient: Kayla Schmitt MRN: [id number] DOB: [birth year]  Admit date:     12/15/2023  Discharge date: {dischdate:26783}  Discharge Physician: Bernardino Schmitt Come   PCP: Kayla Jacquline KATHEE, NP   Recommendations at discharge:  {Tip this will not be part of the note when signed- Example include specific recommendations for outpatient follow-up, pending tests to follow-up on. (Optional):26781}  ***Continue ceftin per urine culture sensitivities, f/u with Dr. Roseann Repeat CBC and consider IV iron  vs. ESA for anemia. Note increase of oral iron  to BID at discharge.  Future Appointments  Date Time Provider Department Center  12/24/2023  2:30 PM [facility]  12/24/2023  3:00 PM [facility]  12/24/2023  3:30 PM Court Dorn PARAS, MD [facility] H&V  12/24/2023  4:00 PM Gretta Lonni PARAS, MD [facility] H&V  12/26/2023  1:30 PM Harden Jerona GAILS, MD [facility] None  12/31/2023 11:15 AM Stoneking, Adine PARAS., MD [facility] None     Discharge Diagnoses: Principal Problem:   Acute on chronic systolic (congestive) heart failure (HCC) Active Problems:   CAD (coronary artery disease)   Hypertension   Chronic kidney disease, stage 3a (HCC)   Type 2 diabetes mellitus with hyperlipidemia (HCC)   Chronic anemia   PVD (peripheral vascular disease) (HCC)   GERD (gastroesophageal reflux disease)   Hypothyroidism   Obesity, class 1  Resolved Problems:   * No resolved hospital problems. [facility] Course: Kayla Schmitt was admitted to the hospital with the working diagnosis of heart failure exacerbation.    58 y.o. 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  Recent hospitalization in [facility] 07/15 to 12/06/23 for heart failure exacerbation, complicated with cardiogenic shock, AKI and urinary tract infection with severe hematuria. She was discharged home  with instructions to take torsemide  80 mg po bid.    Patient was doing well until 48 hrs prior to admission when she noticed persistent and worsening dyspnea. Initially with exertion and then at rest with minimal efforts, associated with PND, orthopnea and right lower extremity edema.   On her initial physical examination her blood pressure was 104/57, HR 103, RR 29 and 02 saturation 94%  Cardiovascular with S1 and S2 present and regular with no gallops or rubs, positive crescendo decrescendo systolic murmur at the base Moderate JVD Respiratory with bilateral rales with no wheezing or rhonchi,  Abdomen with no distention, soft and non tender Left BKA Right lower extremity edema ++ pitting   Na 131, K 3.6 Cl 95 bicarbonate 22 glucose 273 bun 63 cr 1,70  BNP 1,108  High sensitive troponin 89  Lactic acid 1,9 and 1,4  Wbc 7,1 hgb 7,2 plt 271    Chest radiograph with cardiomegaly, bilateral hilar vascular congestion, bilateral central interstitial infiltrates with bilateral pleural effusions.    EKG 103 bpm, normal axis, qtc 490, right bundle branch block, sinus rhythm with poor RR wave progression, with no significant  ST segment or T wave changes.   Patient was placed on furosemide  and had one unit PRBC transfusion.   08/18 clinically improving, continue diuresing   Assessment and Plan: * Acute on chronic systolic (congestive) heart failure (HCC) Echocardiogram with reduced LV systolic function EF 25 to 30%, global hypokinesis, mild dilatated LV cavity, mild to moderate mitral regurgitation, moderate to severe aortic stenosis.   Documented urine output is 850 ml Lost 1 kg from yesterday  Systolic blood pressure 116 mmHg.   Plan to continue diuresis with IV furosemide  60 mg bid  Continue blood pressure support with midodrine .  Limited medical therapy due to risk of hypotension.   CAD (coronary artery disease) High sensitive troponin elevation due to heart failure decompensation,  no acute coronary syndrome.   No chest pain, continue aspirin  and clopidogrel .  Continue statin.   Hypertension Continue blood pressure support with midodrine    Chronic kidney disease, stage 3a (HCC) Hyponatremia hypokalemia   Renal function with serum cr at 1,71 with K at 3,3 and serum bicarbonate at 25  Na 135  Continue diuresis with furosemide , add Kcl 40 meq x2 and follow up renal function and electrolytes.     Type 2 diabetes mellitus with hyperlipidemia (HCC) Uncontrolled hyperglycemia.   Continue insulin  sliding scale for glucose cover and monitoring  Basal insulin  therapy   Continue statin therapy   Chronic anemia Iron  panel with serum iron  at 33, with TIBC 273 and transferrin saturation 12, with ferritin 388.  Patient had severe anemia on prior hospitalization due to hematuria.   08/17 PRBC transfusion x1   Follow up hgb is 7,5   PVD (peripheral vascular disease) (HCC) Sp left BKA Continue dual antiplatelet therapy and statin   GERD (gastroesophageal reflux disease) Continue pantoprazole    Hypothyroidism Continue levothyroxine    Obesity, class 1 Calculated BMI is 32.5       {Tip this will not be part of the note when signed Body mass index is 32.09 kg/m. , ,  (Optional):26781}  {(NOTE) Pain control PDMP Statment (Optional):26782} Consultants: *** Procedures performed: ***  Disposition: {Plan; Disposition:26390} Diet recommendation:  {Diet_Plan:26776} DISCHARGE MEDICATION: Allergies as of 12/18/2023       Reactions   Trental  [pentoxifylline ] Nausea And Vomiting   Vibramycin [doxycycline] Nausea And Vomiting        Medication List     TAKE these medications    acetaminophen  500 MG tablet Commonly known as: TYLENOL  Take 1,000 mg by mouth 2 (two) times daily as needed for moderate pain (pain score 4-6), headache or fever.   aspirin  EC 81 MG tablet Take 81 mg by mouth daily.   atorvastatin  40 MG tablet Commonly known as:  LIPITOR Take 40 mg by mouth daily.   bisacodyl  10 MG suppository Commonly known as: DULCOLAX Place 1 suppository (10 mg total) rectally daily as needed for moderate constipation or mild constipation.   cefUROXime 500 MG tablet Commonly known as: CEFTIN Take 500 mg by mouth 2 (two) times daily.   CertaVite/Antioxidants Tabs Take 1 tablet by mouth daily.   clopidogrel  75 MG tablet Commonly known as: PLAVIX  TAKE 1 TABLET BY MOUTH EVERY DAY WITH BREAKFAST   ferrous sulfate  325 (65 FE) MG EC tablet Take 1 tablet (325 mg total) by mouth in the morning and at bedtime. What changed: when to take this   gabapentin  300 MG capsule Commonly known as: NEURONTIN  Take 300 mg by mouth at bedtime.   levothyroxine  150 MCG tablet Commonly known as: SYNTHROID  Take 150 mcg by mouth daily before breakfast.   midodrine  5 MG tablet Commonly known as: PROAMATINE  Take 1 tablet (5 mg total) by mouth 3 (three) times daily with meals.   nitroGLYCERIN  0.4 MG SL tablet Commonly known as: Nitrostat  Place 1 tablet (0.4 mg total) under the tongue every 5 (five) minutes x 3 doses as needed for chest pain (if no relief after 3rd dose proceed to ED or call 911). 11/06/2022-New   pantoprazole   40 MG tablet Commonly known as: PROTONIX  Take 40 mg by mouth daily.   Pharmacist Choice Alcohol Pads Apply topically.   polyethylene glycol 17 g packet Commonly known as: MIRALAX  / GLYCOLAX  Take 17 g by mouth 2 (two) times daily. What changed:  when to take this reasons to take this   promethazine  25 MG tablet Commonly known as: PHENERGAN  Take 1 tablet (25 mg total) by mouth every 6 (six) hours as needed for nausea or vomiting.   senna-docusate 8.6-50 MG tablet Commonly known as: Senokot-S Take 1 tablet by mouth 2 (two) times daily.   torsemide  20 MG tablet Commonly known as: DEMADEX  Take 4 tablets (80 mg total) by mouth 2 (two) times daily.   Tresiba  FlexTouch 100 UNIT/ML FlexTouch Pen Generic drug:  insulin  degludec Inject 6 Units into the skin at bedtime.        Follow-up Information     [facility] Follow up.   Why: Home health will call to schedule your next home visit.        Kayla Jacquline NOVAK, NP. Schedule an appointment as soon as possible for a visit.   Specialty: Nurse Practitioner Contact information: [address] [place] [address] [phone]                Discharge Exam: Fredricka Weights   12/15/23 1403 12/16/23 0435 12/17/23 0500  Weight: 86.1 kg 85.1 kg 84.8 kg   ***  Condition at discharge: {DC Condition:26389}  The results of significant diagnostics from this hospitalization (including imaging, microbiology, ancillary and laboratory) are listed below for reference.   Imaging Studies: DG Chest Port 1 View Result Date: 12/15/2023 CLINICAL DATA:  Shortness of breath EXAM: PORTABLE CHEST 1 VIEW COMPARISON:  12/15/2023 at 10:08 a.m. FINDINGS: No significant change from earlier today. Diffuse interstitial and airspace opacities compatible with marked pulmonary edema. Layering bilateral pleural effusions. No pneumothorax. Stable cardiomediastinal silhouette. Aortic atherosclerotic calcification. IMPRESSION: No significant change from earlier today. Widespread pulmonary edema and layering bilateral pleural effusions. Electronically Signed   By: Norman Gatlin M.D.   On: 12/15/2023 21:33   DG Chest Port 1 View Result Date: 12/15/2023 CLINICAL DATA:  Short of breath, rales EXAM: PORTABLE CHEST 1 VIEW COMPARISON:  A 525 FINDINGS: Single frontal view of the chest demonstrates interval removal of the left internal jugular catheter. The cardiac silhouette remains enlarged, with widespread interstitial and ground-glass airspace disease unchanged, compatible with edema. Small bilateral pleural effusions are noted, decreased since prior study. No pneumothorax. IMPRESSION: 1. Stable volume status, with widespread pulmonary edema as before. 2. Small  bilateral pleural effusions, decreased since prior study. Electronically Signed   By: Ozell Daring M.D.   On: 12/15/2023 10:14   DG Chest Port 1 View Result Date: 12/03/2023 CLINICAL DATA:  Pulmonary edema EXAM: PORTABLE CHEST 1 VIEW COMPARISON:  Chest radiograph dated 11/30/2023 FINDINGS: Lines/tubes: Interval extubation and removal of enteric tube. Left internal jugular venous catheter tip projects over the confluence of brachiocephalic veins and SVC. Lungs: Low lung volumes. Similar diffuse interstitial and bibasilar patchy opacities. Pleura: Similar small to moderate bilateral pleural effusions. No pneumothorax. Heart/mediastinum: Similar enlarged cardiomediastinal silhouette. Bones: No acute osseous abnormality. IMPRESSION: 1. Interval extubation and removal of enteric tube. 2. Similar diffuse interstitial and bibasilar patchy opacities, likely pulmonary edema. 3. Similar small to moderate bilateral pleural effusions. Electronically Signed   By: Limin  Xu M.D.   On: 12/03/2023 11:27   DG Abd 1 View Result Date: 12/01/2023 CLINICAL DATA:  [id number] Encounter for orogastric (OG) tube placement [id number] EXAM: ABDOMEN - 1 VIEW COMPARISON:  Chest x-ray 11/30/2023 FINDINGS: Partially visualized central venous catheter and endotracheal tubes. Enteric tube courses below the hemidiaphragm with tip collimated off view and side port overlying the expected region of the gastric lumen. The bowel gas pattern is normal. No radio-opaque calculi or other significant radiographic abnormality are seen. Vascular calcifications. Partially visualized lungs demonstrate persistent interstitial/airspace opacities and likely bilateral trace pleural effusions. IMPRESSION: 1. Enteric tube in good position.  Could be retracted by 8 cm. 2. Other lines and tubes only partially visualized and collimated off view. 3. Partially visualized lungs demonstrate persistent interstitial/airspace opacities and likely bilateral trace pleural effusions.  Electronically Signed   By: Morgane  Naveau M.D.   On: 12/01/2023 12:52   DG Abd 1 View Result Date: 11/30/2023 CLINICAL DATA:  Nasogastric tube placement. EXAM: ABDOMEN - 1 VIEW COMPARISON:  11/19/2023 FINDINGS: Nasogastric tube is present with tip and side-port over the stomach in the left mid to upper abdomen. Bowel gas pattern is nonobstructive. There is a paucity bowel gas visualized. No free peritoneal air. Remainder of the exam is unchanged. IMPRESSION: 1. Nonobstructive bowel gas pattern. 2. Nasogastric tube with tip and side-port over the stomach in the left mid to upper abdomen. Electronically Signed   By: Toribio Agreste M.D.   On: 11/30/2023 17:30   DG CHEST PORT 1 VIEW Result Date: 11/30/2023 CLINICAL DATA:  Central line placement EXAM: PORTABLE CHEST 1 VIEW COMPARISON:  11/28/2023 FINDINGS: Single frontal view of the chest demonstrates endotracheal tube overlying tracheal air column, tip approximately 3 cm above carina. Enteric catheter passes below diaphragm, tip excluded by collimation. Left internal jugular catheter tip overlies the SVC. Cardiac silhouette is stable. Widespread interstitial and ground-glass opacities are again seen throughout the lungs. Trace bilateral effusions again noted. No pneumothorax. IMPRESSION: 1. Support devices as above. 2. Multifocal bilateral airspace disease and trace bilateral effusions, without significant change. Electronically Signed   By: Ozell Daring M.D.   On: 11/30/2023 16:26   DG C-Arm 1-60 Min Result Date: 11/30/2023 CLINICAL DATA:  Cystoscopy EXAM: DG C-ARM 1-60 MIN COMPARISON:  None Available. FINDINGS: A single fluoroscopic image was obtained during the performance of the procedure and is provided for interpretation only. Image is obtained over the lower pelvis, with no gross radiographic abnormality identified. Please refer to the operative report. Fluoroscopy time: 23.6 seconds, 6.8 mGy IMPRESSION: 1. Intraoperative evaluation as above. Please  refer to the operative report. Electronically Signed   By: Ozell Daring M.D.   On: 11/30/2023 15:17   DG Pelvis Portable Result Date: 11/30/2023 CLINICAL DATA:  [id number] Surgery, elective [id number]. EXAM: PORTABLE PELVIS 1-2 VIEWS COMPARISON:  Radiograph from earlier the same day. FINDINGS: Interval disappearance of previously seen semi circular thin metallic foreign body. No other radiopaque foreign body seen. IMPRESSION: *Interval disappearance of previously seen semi circular thin metallic foreign body. No other radiopaque foreign body. Critical Value/emergent results were called by telephone at the time of interpretation on 11/30/2023 at 2:28 pm to provider ADINE MANLY , who verbally acknowledged these results. Electronically Signed   By: Ree Molt M.D.   On: 11/30/2023 14:29   DG Pelvis 1-2 Views Result Date: 11/30/2023 CLINICAL DATA:  [id number] Surgery, elective [id number]. EXAM: PELVIS - 1-2 VIEW COMPARISON:  None Available. FINDINGS: The provided image demonstrates An approximately 5-6 mm long half circular, thin metallic foreign body overlying the right side of the pubis near the pubic symphysis.  No other radiopaque foreign bodies. Vascular stent noted overlying the left superior thigh region. IMPRESSION: *There is a single metallic foreign body overlying the right side of the pubis. Critical Value/emergent results were called by telephone at the time of interpretation on 11/30/2023 at 1:47 pm to provider ADINE MANLY , who verbally acknowledged these results. Electronically Signed   By: Ree Molt M.D.   On: 11/30/2023 13:48   CARDIAC CATHETERIZATION Result Date: 11/29/2023 1. Severely elevated PCWP with prominent v-waves. 2. Mildly elevated right heart filling pressures. 3. Moderate pulmonary arterial hypertension 4.  Preserved cardiac output.   US  RENAL Result Date: 11/29/2023 CLINICAL DATA:  58 year old female with congestive heart failure. EXAM: RENAL / URINARY TRACT ULTRASOUND COMPLETE  COMPARISON:  CT Abdomen and Pelvis 03/22/2023. FINDINGS: Right Kidney: Renal measurements: 10.8 x 4.4 x 4.2 cm = volume: 104 mL. No hydronephrosis or right renal mass. Cortical echogenicity is normal. Left Kidney: Renal measurements: 10.2 x 5.1 x 3.8 cm = volume: 102 mL. No left hydronephrosis or renal mass. Normal left renal cortical echogenicity. Bladder: Foley catheter balloon and irregular echogenic debris visible in the urinary bladder (image 49). Other: None. IMPRESSION: 1. Negative ultrasound appearance of both kidneys. 2. Foley catheter in place with irregular echogenic debris within the bladder. Correlate with urinalysis. Electronically Signed   By: VEAR Hurst M.D.   On: 11/29/2023 06:28   DG CHEST PORT 1 VIEW Result Date: 11/28/2023 CLINICAL DATA:  Shortness of breath EXAM: PORTABLE CHEST 1 VIEW COMPARISON:  Chest x-ray 11/28/2023 FINDINGS: Diffuse patchy multifocal airspace disease throughout both lungs has significantly increased. This spares the lung apices. There is no pleural effusion or pneumothorax. The cardiomediastinal silhouette is within normal limits. No acute fractures are seen. IMPRESSION: Increasing diffuse patchy multifocal airspace disease throughout both lungs. Electronically Signed   By: Greig Pique M.D.   On: 11/28/2023 21:25   DG CHEST PORT 1 VIEW Result Date: 11/28/2023 CLINICAL DATA:  [id number] Dyspnea [id number] EXAM: PORTABLE CHEST - 1 VIEW COMPARISON:  Multiple, most recently November 17, 2023 FINDINGS: Interval removal of the endotracheal and esophagogastric tubes and the central venous catheters. Diffuse hazy airspace disease throughout both lungs. Small right pleural effusion. No pneumothorax. Decreased consolidation in the left lung. Mild cardiomegaly. No acute fracture or destructive lesion. IMPRESSION: 1. Similar diffuse hazy airspace disease throughout both lungs with decreasing consolidation in the left lung. Trace right pleural effusion. 2. Interval removal of the support tubes  and lines. Electronically Signed   By: Rogelia Myers M.D.   On: 11/28/2023 12:57   ECHOCARDIOGRAM LIMITED Result Date: 11/20/2023    ECHOCARDIOGRAM LIMITED REPORT   Patient Name:   EXA BOMBA [facility] Date of Exam: 11/20/2023 Medical Rec #:  [id number]              Height:       64.0 in Accession #:    [id number]             Weight:       195.1 lb Date of Birth:  [birth year]               BSA:          1.936 m Patient Age:    57 years               BP:           102/66 mmHg Patient Gender: F  HR:           72 bpm. Exam Location:  Inpatient Procedure: Limited Echo, Limited Color Doppler and Intracardiac Opacification            Agent (Both Spectral and Color Flow Doppler were utilized during            procedure). Indications:    CHF I50.9  History:        Patient has prior history of Echocardiogram examinations, most                 recent 11/15/2023. CHF, Previous Myocardial Infarction and CAD,                 Signs/Symptoms:Chest Pain; Risk Factors:Hypertension, Diabetes                 and Dyslipidemia.  Sonographer:    Thea Norlander RCS Sonographer#2:  Damien Senior RDCS Referring Phys: [id number] [place] IMPRESSIONS  1. Left ventricular ejection fraction, by estimation, is 25 to 30%. The left ventricle has severely decreased function. The left ventricle demonstrates global hypokinesis. The left ventricular internal cavity size was mildly dilated. Left ventricular diastolic parameters are consistent with Grade II diastolic dysfunction (pseudonormalization). Elevated left atrial pressure.  2. The mitral valve is abnormal. Mild to moderate mitral valve regurgitation. Moderate mitral annular calcification.  3. There is moderate calcification of the aortic valve. There is moderate thickening of the aortic valve. Moderate to severe aortic valve stenosis. Aortic valve Vmax measures 3.21 m/s.  4. The inferior vena cava is dilated in size with <50% respiratory variability, suggesting right  atrial pressure of 15 mmHg. FINDINGS  Left Ventricle: Left ventricular ejection fraction, by estimation, is 25 to 30%. The left ventricle has severely decreased function. The left ventricle demonstrates global hypokinesis. Definity  contrast agent was given IV to delineate the left ventricular endocardial borders. The left ventricular internal cavity size was mildly dilated. Left ventricular diastolic parameters are consistent with Grade II diastolic dysfunction (pseudonormalization). Elevated left atrial pressure. Mitral Valve: The mitral valve is abnormal. Moderate mitral annular calcification. Mild to moderate mitral valve regurgitation. Tricuspid Valve: The tricuspid valve is normal in structure. Tricuspid valve regurgitation is trivial. Aortic Valve: Suspect primarily moderate AS, Vmax 3.62m/s with DVI 0.26. There is moderate calcification of the aortic valve. There is moderate thickening of the aortic valve. Moderate to severe aortic stenosis is present. Aortic valve mean gradient measures 22.0 mmHg. Aortic valve peak gradient measures 41.2 mmHg. Aortic valve area, by VTI measures 0.82 cm. Pulmonic Valve: The pulmonic valve was normal in structure. Pulmonic valve regurgitation is trivial. Venous: The inferior vena cava is dilated in size with less than 50% respiratory variability, suggesting right atrial pressure of 15 mmHg. LEFT VENTRICLE PLAX 2D LVIDd:         5.60 cm   Diastology LVIDs:         5.00 cm   LV e' medial:    6.64 cm/s LV PW:         0.90 cm   LV E/e' medial:  21.4 LV IVS:        0.80 cm   LV e' lateral:   8.05 cm/s LVOT diam:     1.90 cm   LV E/e' lateral: 17.6 LV SV:         39 LV SV Index:   20 LVOT Area:     2.84 cm  RIGHT VENTRICLE         IVC  TAPSE (M-mode): 2.0 cm  IVC diam: 2.70 cm LEFT ATRIUM         Index LA diam:    4.10 cm 2.12 cm/m  AORTIC VALVE AV Area (Vmax):    0.74 cm AV Area (Vmean):   0.96 cm AV Area (VTI):     0.82 cm AV Vmax:           321.00 cm/s AV Vmean:           185.500 cm/s AV VTI:            0.480 m AV Peak Grad:      41.2 mmHg AV Mean Grad:      22.0 mmHg LVOT Vmax:         83.60 cm/s LVOT Vmean:        63.000 cm/s LVOT VTI:          0.138 m LVOT/AV VTI ratio: 0.29  AORTA Ao Root diam: 2.90 cm MITRAL VALVE MV Area (PHT): 4.49 cm     SHUNTS MV Decel Time: 169 msec     Systemic VTI:  0.14 m MV E velocity: 142.00 cm/s  Systemic Diam: 1.90 cm MV A velocity: 84.20 cm/s MV E/A ratio:  1.69 Morene Brownie Electronically signed by Morene Brownie Signature Date/Time: 11/20/2023/5:15:20 PM    Final    DG Abd 1 View Result Date: 11/19/2023 CLINICAL DATA:  Diarrhea. EXAM: ABDOMEN - 1 VIEW COMPARISON:  11/14/2023. FINDINGS: Nonobstructive bowel gas pattern. Scattered gas and stool in the colon. No acute osseous abnormality. IMPRESSION: Nonobstructive bowel gas pattern. Scattered gas and stool in the colon. Electronically Signed   By: Harrietta Sherry M.D.   On: 11/19/2023 12:38    Microbiology: Results for orders placed or performed during the hospital encounter of 12/15/23  MRSA Next Gen by PCR, Nasal     Status: None   Collection Time: 12/15/23 11:40 AM   Specimen: Nasal Mucosa; Nasal Swab  Result Value Ref Range Status   MRSA by PCR Next Gen NOT DETECTED NOT DETECTED Final    Comment: (NOTE) The GeneXpert MRSA Assay (FDA approved for NASAL specimens only), is one component of a comprehensive MRSA colonization surveillance program. It is not intended to diagnose MRSA infection nor to guide or monitor treatment for MRSA infections. Test performance is not FDA approved in patients less than 43 years old. Performed at [facility], [address], [place], [place] [address]     Labs: CBC: Recent Labs  Lab 12/15/23 [phone] 12/16/23 0510 12/17/23 0522 12/17/23 1259 12/18/23 0402  WBC 7.1 6.8  --   --  6.0  NEUTROABS 4.3  --   --   --   --   HGB 7.2* 7.5* 6.7* 8.7* 8.4*  HCT 23.3* 23.7* 23.5* 27.4* 27.6*  MCV 97.9 97.5  --   --  97.5  PLT 271 255  --    --  271   Basic Metabolic Panel: Recent Labs  Lab 12/15/23 0959 12/16/23 0510 12/17/23 0522 12/18/23 0402  NA 131* 135 134* 135  K 3.6 3.3* 4.4 3.7  CL 95* 98 102 100  CO2 22 25 22 22   GLUCOSE 273* 197* 179* 161*  BUN 63* 69* 76* 79*  CREATININE 1.70* 1.71* 1.94* 1.87*  CALCIUM  8.8* 8.8* 8.8* 8.8*  MG  --   --  1.9  --    Liver Function Tests: Recent Labs  Lab 12/15/23 0959  AST 27  ALT 33  ALKPHOS 104  BILITOT  0.8  PROT 7.7  ALBUMIN  3.1*   CBG: Recent Labs  Lab 12/17/23 0738 12/17/23 1119 12/17/23 1642 12/17/23 2021 12/18/23 0724  GLUCAP 197* 252* 152* 199* 156*    Discharge time spent: {LESS THAN/GREATER UYJW:73611} 30 minutes.  Signed: Bernardino Schmitt Come, MD [facility] 12/18/2023

## 2023-12-24 ENCOUNTER — Ambulatory Visit (INDEPENDENT_AMBULATORY_CARE_PROVIDER_SITE_OTHER): Admitting: Cardiovascular Disease

## 2023-12-24 ENCOUNTER — Ambulatory Visit
Admission: RE | Admit: 2023-12-24 | Discharge: 2023-12-24 | Disposition: A | Discharge status: Home / Self Care | Source: Ambulatory Visit | Attending: Vascular Surgery | Admitting: Vascular Surgery

## 2023-12-24 ENCOUNTER — Ambulatory Visit (HOSPITAL_BASED_OUTPATIENT_CLINIC_OR_DEPARTMENT_OTHER)
Admission: RE | Admit: 2023-12-24 | Discharge: 2023-12-24 | Disposition: A | Discharge status: Home / Self Care | Source: Ambulatory Visit | Attending: Vascular Surgery

## 2023-12-24 ENCOUNTER — Encounter: Payer: Self-pay | Admitting: Cardiovascular Disease

## 2023-12-24 ENCOUNTER — Ambulatory Visit: Attending: Vascular Surgery | Admitting: Vascular Surgery

## 2023-12-24 VITALS — BP 90/48 | HR 71 | Temp 97.6°F | Resp 18 | Ht 64.0 in | Wt 190.0 lb

## 2023-12-24 VITALS — BP 104/58 | HR 78 | Ht 64.0 in | Wt 190.0 lb

## 2023-12-24 DIAGNOSIS — I739 Peripheral vascular disease, unspecified: Secondary | ICD-10-CM | POA: Insufficient documentation

## 2023-12-24 DIAGNOSIS — Z01818 Encounter for other preprocedural examination: Secondary | ICD-10-CM

## 2023-12-24 DIAGNOSIS — I2510 Atherosclerotic heart disease of native coronary artery without angina pectoris: Secondary | ICD-10-CM | POA: Diagnosis present

## 2023-12-24 DIAGNOSIS — I70223 Atherosclerosis of native arteries of extremities with rest pain, bilateral legs: Secondary | ICD-10-CM

## 2023-12-24 DIAGNOSIS — I350 Nonrheumatic aortic (valve) stenosis: Secondary | ICD-10-CM

## 2023-12-24 DIAGNOSIS — I10 Essential (primary) hypertension: Secondary | ICD-10-CM | POA: Insufficient documentation

## 2023-12-24 DIAGNOSIS — E782 Mixed hyperlipidemia: Secondary | ICD-10-CM | POA: Diagnosis not present

## 2023-12-24 DIAGNOSIS — I340 Nonrheumatic mitral (valve) insufficiency: Secondary | ICD-10-CM | POA: Diagnosis present

## 2023-12-24 DIAGNOSIS — I5023 Acute on chronic systolic (congestive) heart failure: Secondary | ICD-10-CM | POA: Diagnosis present

## 2023-12-24 LAB — VAS US ABI WITH/WO TBI: Right ABI: 0.69

## 2023-12-24 NOTE — Progress Notes (Signed)
 Patient name: Kayla Schmitt MRN: [id number] DOB: 23-Jun-1965 Sex: female  REASON FOR VISIT: 3 month follow-up PAD  HPI: Kayla Schmitt is a 58 y.o. female with history of hypertension, hyperlipidemia, diabetes that presents for 3 month follow-up of PAD.    Previously underwent bilateral lower extremity SFA pop stenting for CLI.  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  She states her stump is healing.  She has an appoint with Dr. Harden on Thursday.  We are following her right SFA popliteal stents.  These last underwent laser arthrectomy with DCB on 08/06/2022 for in-stent stenosis.  No new complaints today.  Right TMA remains healed.  Past Medical History:  Diagnosis Date   Anemia    Aortic stenosis    Arthritis    CHF (congestive heart failure) (HCC)    Coronary artery disease    Diabetes mellitus without complication (HCC)    type 2   GERD (gastroesophageal reflux disease)    Heart murmur    History of blood transfusion    Hypercholesteremia    Hypertension    Hypothyroidism    Mitral regurgitation    Myocardial infarction (HCC)    Peripheral vascular disease (HCC)    PONV (postoperative nausea and vomiting)    Thyroid disease     Past Surgical History:  Procedure Laterality Date   ABDOMINAL AORTOGRAM W/LOWER EXTREMITY Bilateral 01/14/2020   Procedure: ABDOMINAL AORTOGRAM W/LOWER EXTREMITY;  Surgeon: Gretta Lonni PARAS, MD;  Location: [facility];  Service: Cardiovascular;  Laterality: Bilateral;   ABDOMINAL AORTOGRAM W/LOWER EXTREMITY N/A 07/05/2022   Procedure: ABDOMINAL AORTOGRAM W/LOWER EXTREMITY;  Surgeon: Gretta Lonni PARAS, MD;  Location: [facility];  Service: Cardiovascular;  Laterality: N/A;   ABDOMINAL AORTOGRAM W/LOWER EXTREMITY N/A 08/02/2022   Procedure: ABDOMINAL AORTOGRAM W/LOWER EXTREMITY;  Surgeon: Gretta Lonni PARAS, MD;  Location: [facility];  Service:  Cardiovascular;  Laterality: N/A;   ABDOMINAL AORTOGRAM W/LOWER EXTREMITY N/A 08/06/2022   Procedure: ABDOMINAL AORTOGRAM W/LOWER EXTREMITY;  Surgeon: Gretta Lonni PARAS, MD;  [facility]: [facility];  Service: Cardiovascular;  Laterality: N/A;   AMPUTATION Bilateral 07/20/2022   Procedure: AMPUTATION RIGHT GREAT TOE AND SECOND TOE, AMPUTATION LEFT GREAT TOE;  Surgeon: Harden Jerona GAILS, MD;  Location: [facility] OR;  Service: Orthopedics;  Laterality: Bilateral;   AMPUTATION Bilateral 08/08/2022   Procedure: BILATERAL TRANSMETATARSAL AMPUTATION;  Surgeon: Harden Jerona GAILS, MD;  Location: [facility] OR;  Service: Orthopedics;  Laterality: Bilateral;   AMPUTATION Left 10/22/2023   Procedure: AMPUTATION BELOW KNEE;  Surgeon: Lanis Fonda BRAVO, MD;  Location: [facility] OR;  Service: Vascular;  Laterality: Left;   AMPUTATION TOE     BIOPSY  04/06/2023   Procedure: BIOPSY;  Surgeon: Fergie Elspeth SQUIBB, MD;  Location: [facility] ENDOSCOPY;  Service: Gastroenterology;;   COLONOSCOPY WITH PROPOFOL  N/A 04/06/2023   Procedure: COLONOSCOPY WITH PROPOFOL ;  Surgeon: Lilymae Elspeth SQUIBB, MD;  Location: [facility] ENDOSCOPY;  Service: Gastroenterology;  Laterality: N/A;   CYSTOSCOPY WITH STENT PLACEMENT N/A 11/30/2023   Procedure: CYSTOSCOPY, WITH FULGURATION OF BLEEDING AND CLOT EVACUATION;  Surgeon: Roseann Adine PARAS., MD;  Location: [facility] OR;  Service: Urology;  Laterality: N/A;  CLOT EVACUATION OF BLADDER   ESOPHAGOGASTRODUODENOSCOPY (EGD) WITH PROPOFOL  N/A 04/06/2023   Procedure: ESOPHAGOGASTRODUODENOSCOPY (EGD) WITH PROPOFOL ;  Surgeon: Chivonne Elspeth SQUIBB, MD;  Location: [facility] ENDOSCOPY;  Service: Gastroenterology;  Laterality: N/A;   GIVENS CAPSULE STUDY N/A 04/06/2023   Procedure:  GIVENS CAPSULE STUDY;  Surgeon: Avni Elspeth SQUIBB, MD;  Location: [facility] ENDOSCOPY;  Service: Gastroenterology;  Laterality: N/A;   PERIPHERAL VASCULAR ATHERECTOMY Right 01/14/2020   Procedure: PERIPHERAL VASCULAR ATHERECTOMY;  Surgeon: Gretta Lonni PARAS, MD;  Location:  [facility] INVASIVE CV LAB;  Service: Cardiovascular;  Laterality: Right;  sfa   PERIPHERAL VASCULAR INTERVENTION Right 01/14/2020   Procedure: PERIPHERAL VASCULAR INTERVENTION;  Surgeon: Gretta Lonni PARAS, MD;  [facility];  Service: Cardiovascular;  Laterality: Right;  sfa stent    PERIPHERAL VASCULAR INTERVENTION  07/05/2022   Procedure: PERIPHERAL VASCULAR INTERVENTION;  Surgeon: Gretta Lonni PARAS, MD;  [facility];  Service: Cardiovascular;;   PERIPHERAL VASCULAR INTERVENTION  08/02/2022   Procedure: PERIPHERAL VASCULAR INTERVENTION;  Surgeon: Gretta Lonni PARAS, MD;  [facility];  Service: Cardiovascular;;   PERIPHERAL VASCULAR INTERVENTION  08/06/2022   Procedure: PERIPHERAL VASCULAR INTERVENTION;  Surgeon: Gretta Lonni PARAS, MD;  Location: [facility] INVASIVE CV LAB;  Service: Cardiovascular;;   PERIPHERAL VASCULAR THROMBECTOMY  08/02/2022   Procedure: PERIPHERAL VASCULAR THROMBECTOMY;  Surgeon: Gretta Lonni PARAS, MD;  [facility];  Service: Cardiovascular;;   REVISION AMPUTATION, BELOW THE KNEE Left 10/23/2023   Procedure: REVISION AMPUTATION, BELOW THE KNEE;  Surgeon: Harden Jerona GAILS, MD;  Location: [facility] OR;  Service: Orthopedics;  Laterality: Left;  REVISION LEFT BELOW KNEE AMPUTATION   RIGHT HEART CATH N/A 11/29/2023   Procedure: RIGHT HEART CATH;  Surgeon: Rolan Ezra RAMAN, MD;  Location: [facility] INVASIVE CV LAB;  Service: Cardiovascular;  Laterality: N/A;   RIGHT/LEFT HEART CATH AND CORONARY ANGIOGRAPHY N/A 08/23/2022   Procedure: RIGHT/LEFT HEART CATH AND CORONARY ANGIOGRAPHY;  Surgeon: Dann Candyce RAMAN, MD;  Location: [facility] INVASIVE CV LAB;  Service: Cardiovascular;  Laterality: N/A;   SKIN SPLIT GRAFT Right 03/18/2020   Procedure: SKIN GRAFTING RIGHT FOOT ULCER;  Surgeon: Harden Jerona GAILS, MD;  Location: [facility] OR;  Service: Orthopedics;  Laterality: Right;   STUMP REVISION Right 11/30/2022   Procedure: REVISION RIGHT TRANSMETATARSAL  AMPUTATION;  Surgeon: Harden Jerona GAILS, MD;  Location: [facility] OR;  Service: Orthopedics;  Laterality: Right;   TEE WITHOUT CARDIOVERSION N/A 08/27/2022   Procedure: TRANSESOPHAGEAL ECHOCARDIOGRAM;  Surgeon: Hobart Powell BRAVO, MD;  Location: [facility] INVASIVE CV LAB;  Service: Cardiovascular;  Laterality: N/A;   TRANSESOPHAGEAL ECHOCARDIOGRAM (CATH LAB) N/A 10/24/2023   Procedure: TRANSESOPHAGEAL ECHOCARDIOGRAM;  Surgeon: Michele Richardson, DO;  [facility]: [facility];  Service: Cardiovascular;  Laterality: N/A;   WISDOM TOOTH EXTRACTION      Family History  Problem Relation Age of Onset   Diabetes Other    CAD Other     SOCIAL HISTORY: Social History   Tobacco Use   Smoking status: Former    Current packs/day: 0.00    Types: Cigarettes    Quit date: 01/2017    Years since quitting: 6.9   Smokeless tobacco: Never  Substance Use Topics   Alcohol use: Not Currently    Allergies  Allergen Reactions   Trental  [Pentoxifylline ] Nausea And Vomiting   Vibramycin [Doxycycline] Nausea And Vomiting    Current Outpatient Medications  Medication Sig Dispense Refill   acetaminophen  (TYLENOL ) 500 MG tablet Take 1,000 mg by mouth 2 (two) times daily as needed for moderate pain (pain score 4-6), headache or fever.     Alcohol Swabs  (PHARMACIST CHOICE ALCOHOL) PADS Apply topically.     aspirin  81 MG EC tablet Take 81 mg by mouth daily.  atorvastatin  (LIPITOR) 40 MG tablet Take 40 mg by mouth daily.     bisacodyl  (DULCOLAX) 10 MG suppository Place 1 suppository (10 mg total) rectally daily as needed for moderate constipation or mild constipation.     cefUROXime (CEFTIN) 500 MG tablet Take 500 mg by mouth 2 (two) times daily.     clopidogrel  (PLAVIX ) 75 MG tablet TAKE 1 TABLET BY MOUTH EVERY DAY WITH BREAKFAST 90 tablet 3   ferrous sulfate  325 (65 FE) MG EC tablet Take 1 tablet (325 mg total) by mouth in the morning and at bedtime. 60 tablet 0   gabapentin  (NEURONTIN ) 300 MG capsule Take 300 mg by  mouth at bedtime.     insulin  degludec (TRESIBA  FLEXTOUCH) 100 UNIT/ML FlexTouch Pen Inject 6 Units into the skin at bedtime.     levothyroxine  (SYNTHROID ) 150 MCG tablet Take 150 mcg by mouth daily before breakfast.     midodrine  (PROAMATINE ) 5 MG tablet Take 1 tablet (5 mg total) by mouth 3 (three) times daily with meals. 90 tablet 0   Multiple Vitamin (MULTIVITAMIN WITH MINERALS) TABS tablet Take 1 tablet by mouth daily. 30 tablet 0   nitroGLYCERIN  (NITROSTAT ) 0.4 MG SL tablet Place 1 tablet (0.4 mg total) under the tongue every 5 (five) minutes x 3 doses as needed for chest pain (if no relief after 3rd dose proceed to ED or call 911). 11/06/2022-New 25 tablet 3   pantoprazole  (PROTONIX ) 40 MG tablet Take 40 mg by mouth daily.     polyethylene glycol (MIRALAX  / GLYCOLAX ) 17 g packet Take 17 g by mouth 2 (two) times daily. (Patient taking differently: Take 17 g by mouth daily as needed for mild constipation or moderate constipation.)     promethazine  (PHENERGAN ) 25 MG tablet Take 1 tablet (25 mg total) by mouth every 6 (six) hours as needed for nausea or vomiting. 20 tablet 0   senna-docusate (SENOKOT-S) 8.6-50 MG tablet Take 1 tablet by mouth 2 (two) times daily.     torsemide  (DEMADEX ) 20 MG tablet Take 4 tablets (80 mg total) by mouth 2 (two) times daily. 240 tablet 0   No current facility-administered medications for this visit.    REVIEW OF SYSTEMS:  [X]  denotes positive finding, [ ]  denotes negative finding Cardiac  Comments:  Chest pain or chest pressure:    Shortness of breath upon exertion:    Short of breath when lying flat:    Irregular heart rhythm:        Vascular    Pain in calf, thigh, or hip brought on by ambulation:    Pain in feet at night that wakes you up from your sleep:     Blood clot in your veins:    Leg swelling:         Pulmonary    Oxygen  at home:    Productive cough:     Wheezing:         Neurologic    Sudden weakness in arms or legs:     Sudden  numbness in arms or legs:     Sudden onset of difficulty speaking or slurred speech:    Temporary loss of vision in one eye:     Problems with dizziness:         Gastrointestinal    Blood in stool:     Vomited blood:         Genitourinary    Burning when urinating:     Blood in urine:  Psychiatric    Major depression:         Hematologic    Bleeding problems:    Problems with blood clotting too easily:        Skin    Rashes or ulcers:        Constitutional    Fever or chills:      PHYSICAL EXAM: There were no vitals filed for this visit.   GENERAL: The patient is a well-nourished female, in no acute distress. The vital signs are documented above. CARDIAC: There is a regular rate and rhythm.  VASCULAR:  Bilateral femoral pulses palpable Right TMA healed without wound Left BKA   DATA:   ABIs today are 0.69 right monophasic  Lower extremity arterial duplex shows patent right SFA pop stents with moderate 50-74% stenosis  Assessment/Plan:  58 y.o. female with history of hypertension, hyperlipidemia, diabetes that presents for 3 month follow-up of PAD.    Previously underwent bilateral lower extremity SFA pop stenting for CLI.  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  She states her stump is healing.  She has an appointment with Dr. Harden on Thursday.  We are following her right SFA popliteal stents.  These last underwent laser arthrectomy with DCB on 08/06/2022.  Does have evidence of moderate stenosis in the right SFA popliteal stents as we discussed today in the 50-70% range.  This actually looks better than her prior visit earlier this year with duplex imaging.  I do think she will eventually require additional intervention as we discussed to maintain patency.  Will delay that until they can get her blood pressure optimized and now on midodrine  for hypotension and she has been in the hospital for  nearly 40 days.  I will see in 3 months with repeat duplex imaging.  Lonni DOROTHA Gaskins, MD [facility] Office: [phone]

## 2023-12-24 NOTE — Assessment & Plan Note (Signed)
 2D echo performed 11/20/2023 showed mild to moderate mitral regurgitation.

## 2023-12-24 NOTE — Assessment & Plan Note (Signed)
 History of essential hypertension with blood pressure measured today 104/58.  She is not on antihypertensive medications.

## 2023-12-24 NOTE — Progress Notes (Signed)
 12/24/2023 Kayla Schmitt   1965/10/07  [id number]  Primary Physician [name], NP Primary Cardiologist: Dorn JINNY Lesches MD [facility], [place], [place]  HPI:  Kayla Schmitt is a 58 y.o. mild-to-moderately overweight divorced Caucasian female with no children who is accompanied by her mother.  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  She was previously a patient of Dr. Mallipeddi  and wished to transition her care to me at her hospitalization in December.  Her risk factors include treated hyperlipidemia and diabetes.  She does not smoke.  She did have a non-STEMI back in April 2024 and had cath performed by Dr. Dann revealing three-vessel disease.  Bypass grafting was recommended.  She did see Dr. Maryjane as an outpatient ultimately was decided to treat her medically.  She also has PAD and has had multiple interventions on her lower extremities by Dr. Gretta.  She ultimately underwent left BKA by Dr. Harden 2 months ago for critical limb ischemia.  She is followed in advanced heart failure clinic.  Echo performed 11/20/2023 revealed global hypokinesia with an EF of 25 to 30%, mild to moderate MR and moderate to severe aortic stenosis.   Current Meds  Medication Sig   acetaminophen  (TYLENOL ) 500 MG tablet Take 1,000 mg by mouth 2 (two) times daily as needed for moderate pain (pain score 4-6), headache or fever.   Alcohol Swabs  (PHARMACIST CHOICE ALCOHOL) PADS Apply topically.   aspirin  81 MG EC tablet Take 81 mg by mouth daily.   atorvastatin  (LIPITOR) 40 MG tablet Take 40 mg by mouth daily.   bisacodyl  (DULCOLAX) 10 MG suppository Place 1 suppository (10 mg total) rectally daily as needed for moderate constipation or mild constipation.   cefUROXime (CEFTIN) 500 MG tablet Take 500 mg by mouth 2 (two) times daily.   clopidogrel  (PLAVIX ) 75 MG tablet  TAKE 1 TABLET BY MOUTH EVERY DAY WITH BREAKFAST   ferrous sulfate  325 (65 FE) MG EC tablet Take 1 tablet (325 mg total) by mouth in the morning and at bedtime.   gabapentin  (NEURONTIN ) 300 MG capsule Take 300 mg by mouth at bedtime.   insulin  degludec (TRESIBA  FLEXTOUCH) 100 UNIT/ML FlexTouch Pen Inject 6 Units into the skin at bedtime.   levothyroxine  (SYNTHROID ) 150 MCG tablet Take 150 mcg by mouth daily before breakfast.   midodrine  (PROAMATINE ) 5 MG tablet Take 1 tablet (5 mg total) by mouth 3 (three) times daily with meals.   Multiple Vitamin (MULTIVITAMIN WITH MINERALS) TABS tablet Take 1 tablet by mouth daily.   nitroGLYCERIN  (NITROSTAT ) 0.4 MG SL tablet Place 1 tablet (0.4 mg total) under the tongue every 5 (five) minutes x 3 doses as needed for chest pain (if no relief after 3rd dose proceed to ED or call 911). 11/06/2022-New   pantoprazole  (PROTONIX ) 40 MG tablet Take 40 mg by mouth daily.   polyethylene glycol (MIRALAX  / GLYCOLAX ) 17 g packet Take 17 g by mouth 2 (two) times daily. (Patient taking differently: Take 17 g by mouth daily as needed for mild constipation or moderate constipation.)   promethazine  (PHENERGAN ) 25 MG tablet Take 1 tablet (25 mg total) by mouth every 6 (six) hours as needed for nausea or vomiting.   senna-docusate (SENOKOT-S) 8.6-50 MG tablet Take 1 tablet by mouth 2 (two) times daily.   torsemide  (DEMADEX ) 20 MG tablet Take  4 tablets (80 mg total) by mouth 2 (two) times daily.     Allergies  Allergen Reactions   Trental  [Pentoxifylline ] Nausea And Vomiting   Vibramycin [Doxycycline] Nausea And Vomiting    Social History   Socioeconomic History   Marital status: Divorced    Spouse name: Not on file   Number of children: 0   Years of education: Not on file   Highest education level: Not on file  Occupational History   Not on file  Tobacco Use   Smoking status: Former    Current packs/day: 0.00    Types: Cigarettes    Quit date: 01/2017    Years  since quitting: 6.9   Smokeless tobacco: Never  Vaping Use   Vaping status: Never Used  Substance and Sexual Activity   Alcohol use: Not Currently   Drug use: Yes    Types: Marijuana    Comment: on ocassion- Last time - 11/13/22   Sexual activity: Yes    Birth control/protection: Post-menopausal  Other Topics Concern   Not on file  Social History Narrative   Not on file   Social Drivers of Health   Financial Resource Strain: Low Risk  (10/19/2023)   Received from [facility]   Overall Financial Resource Strain (CARDIA)    How hard is it for you to pay for the very basics like food, housing, medical care, and heating?: Not hard at all  Food Insecurity: No Food Insecurity (12/15/2023)   Hunger Vital Sign    Worried About Running Out of Food in the Last Year: Never true    Ran Out of Food in the Last Year: Never true  Transportation Needs: No Transportation Needs (12/15/2023)   PRAPARE - Administrator, Civil Service (Medical): No    Lack of Transportation (Non-Medical): No  Physical Activity: Not on file  Stress: Not on file  Social Connections: Unknown (12/15/2023)   Social Connection and Isolation Panel    Frequency of Communication with Friends and Family: Three times a week    Frequency of Social Gatherings with Friends and Family: Three times a week    Attends Religious Services: Not on file    Active Member of Clubs or Organizations: Not on file    Attends Club or Organization Meetings: Not on file    Marital Status: Not on file  Intimate Partner Violence: Not At Risk (12/15/2023)   Humiliation, Afraid, Rape, and Kick questionnaire    Fear of Current or Ex-Partner: No    Emotionally Abused: No    Physically Abused: No    Sexually Abused: No     Review of Systems: General: negative for chills, fever, night sweats or weight changes.  Cardiovascular: negative for chest pain, dyspnea on exertion, edema, orthopnea, palpitations, paroxysmal nocturnal dyspnea  or shortness of breath Dermatological: negative for rash Respiratory: negative for cough or wheezing Urologic: negative for hematuria Abdominal: negative for nausea, vomiting, diarrhea, bright red blood per rectum, melena, or hematemesis Neurologic: negative for visual changes, syncope, or dizziness All other systems reviewed and are otherwise negative except as noted above.    Blood pressure (!) 104/58, pulse 78, height 5' 4 (1.626 m), weight 190 lb (86.2 kg), SpO2 95%.  General appearance: alert and no distress Neck: no adenopathy, no carotid bruit, no JVD, supple, symmetrical, trachea midline, and thyroid not enlarged, symmetric, no tenderness/mass/nodules Lungs: clear to auscultation bilaterally Heart: 2/6 outflow tract murmur consistent with aortic stenosis Extremities: Left BKA, mild  edema in the right extremity. Pulses: Left BKA, diminished right pedal pulse Skin: Skin color, texture, turgor normal. No rashes or lesions Neurologic: Grossly normal  EKG EKG Interpretation Date/Time:  Tuesday December 24 2023 15:47:14 EDT Ventricular Rate:  75 PR Interval:  182 QRS Duration:  114 QT Interval:  432 QTC Calculation: 482 R Axis:   74  Text Interpretation: Sinus rhythm with occasional Premature ventricular complexes Anterior infarct , age undetermined When compared with ECG of 15-Dec-2023 10:00, PREVIOUS ECG IS PRESENT Confirmed by Court Carrier [id number]) on 12/24/2023 3:54:58 PM    ASSESSMENT AND PLAN:   Hyperlipidemia History of hyperlipidemia on atorvastatin  with lipid profile performed 05/09/2023 revealing total cholesterol 141, LDL of 73 and HDL 52.  Hypertension History of essential hypertension with blood pressure measured today 104/58.  She is not on antihypertensive medications.  Mitral regurgitation 2D echo performed 11/20/2023 showed mild to moderate mitral regurgitation.  CAD (coronary artery disease) History of CAD status post non-STEMI April 2024.  She underwent  cardiac catheterization by Dr. Dann at that time revealing three-vessel disease.  She is totally asymptomatic and did see Dr. Maryjane after that.  The decision was made not to pursue CABG at that time.  Aortic stenosis 2D echo performed 11/20/2023 showed moderate to severe aortic stenosis with an aortic valve area of 0.82 cm and a peak gradient of 41 mmHg.  She is currently asymptomatic.  Will continue to follow this noninvasively.  PVD (peripheral vascular disease) (HCC) Followed by Dr. Gretta status post multiple interventions bilaterally.  She had left lower extremity BKA by Dr. Harden 2 months ago.  Acute on chronic systolic (congestive) heart failure (HCC) Followed by the advanced heart failure clinic.  Echo performed 11/20/2023 revealed an EF of 25 to 30% with grade 2 diastolic dysfunction, mild to moderate MR and moderate to severe aortic stenosis.  She currently is not on GDMT and does have moderate renal insufficiency.  Her blood pressure is soft precluding the use of beta-blockers.     Carrier DOROTHA Court MD FACP,FACC,FAHA, [facility] 12/24/2023 4:18 PM

## 2023-12-24 NOTE — Assessment & Plan Note (Signed)
 2D echo performed 11/20/2023 showed moderate to severe aortic stenosis with an aortic valve area of 0.82 cm and a peak gradient of 41 mmHg.  She is currently asymptomatic.  Will continue to follow this noninvasively.

## 2023-12-24 NOTE — Assessment & Plan Note (Signed)
 Followed by the advanced heart failure clinic.  Echo performed 11/20/2023 revealed an EF of 25 to 30% with grade 2 diastolic dysfunction, mild to moderate MR and moderate to severe aortic stenosis.  She currently is not on GDMT and does have moderate renal insufficiency.  Her blood pressure is soft precluding the use of beta-blockers.

## 2023-12-24 NOTE — Assessment & Plan Note (Signed)
 History of hyperlipidemia on atorvastatin  with lipid profile performed 05/09/2023 revealing total cholesterol 141, LDL of 73 and HDL 52.

## 2023-12-24 NOTE — Patient Instructions (Signed)
 Medication Instructions:  Your physician recommends that you continue on your current medications as directed. Please refer to the Current Medication list given to you today.  *If you need a refill on your cardiac medications before your next appointment, please call your pharmacy*   Follow-Up: At [facility], you and your health needs are our priority.  As part of our continuing mission to provide you with exceptional heart care, our providers are all part of one team.  This team includes your primary Cardiologist (physician) and Advanced Practice Providers or APPs (Physician Assistants and Nurse Practitioners) who all work together to provide you with the care you need, when you need it.  Your next appointment:   3 month(s)  Provider:   Jon Hails, PA-C, Callie Goodrich, PA-C, Lamarr Satterfield, NP, Damien Braver, NP, or Katlyn West, NP         Then, Dorn Lesches, MD will plan to see you again in 6 month(s).     We recommend signing up for the patient portal called MyChart.  Sign up information is provided on this After Visit Summary.  MyChart is used to connect with patients for Virtual Visits (Telemedicine).  Patients are able to view lab/test results, encounter notes, upcoming appointments, etc.  Non-urgent messages can be sent to your provider as well.   To learn more about what you can do with MyChart, go to [email or web address].

## 2023-12-24 NOTE — Assessment & Plan Note (Signed)
 History of CAD status post non-STEMI April 2024.  She underwent cardiac catheterization by Dr. Dann at that time revealing three-vessel disease.  She is totally asymptomatic and did see Dr. Maryjane after that.  The decision was made not to pursue CABG at that time.

## 2023-12-24 NOTE — Assessment & Plan Note (Signed)
 Followed by Dr. Gretta status post multiple interventions bilaterally.  She had left lower extremity BKA by Dr. Harden 2 months ago.

## 2023-12-25 ENCOUNTER — Ambulatory Visit
Admission: RE | Admit: 2023-12-25 | Discharge: 2023-12-25 | Disposition: A | Discharge status: Home / Self Care | Source: Ambulatory Visit | Attending: Urology | Admitting: Urology

## 2023-12-25 ENCOUNTER — Other Ambulatory Visit: Payer: Self-pay

## 2023-12-25 DIAGNOSIS — I739 Peripheral vascular disease, unspecified: Secondary | ICD-10-CM

## 2023-12-25 DIAGNOSIS — R310 Gross hematuria: Secondary | ICD-10-CM

## 2023-12-26 ENCOUNTER — Ambulatory Visit: Admitting: Orthopedic Surgery

## 2023-12-26 ENCOUNTER — Encounter: Payer: Self-pay | Admitting: Orthopedic Surgery

## 2023-12-26 DIAGNOSIS — S88112A Complete traumatic amputation at level between knee and ankle, left lower leg, initial encounter: Secondary | ICD-10-CM

## 2023-12-26 NOTE — Progress Notes (Signed)
 Office Visit Note   Patient: Kayla Schmitt           Date of Birth: 22-Dec-1965           MRN: [id number] Visit Date: 12/26/2023              Requested by: [name], NP [address] 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  History of Present Illness Kayla Schmitt is a 58 year old female who presents with slow healing of the medial and lateral aspects of her residual limb.  The healing process of her residual limb is progressing slowly. She uses a shrinker daily and washes around the area but avoids direct contact with the wound. No topical treatments have been applied since her last hospital visit.  The drainage from the wound exceeds the absorption capacity of the shrinker. She inquires about using bandages due to the drainage.  She has seen a cardiologist. The vascular specialist noted improved circulation in her right leg compared to previous visits. An EKG was performed by the cardiologist, which was reported as okay. She has an upcoming appointment with the heart failure team next week.  Regarding nutrition, she does not consume meat and supplements her diet with protein powder. She is considering using Juven but is concerned about its cost.     Assessment & Plan: Visit Diagnoses: No diagnosis found.  Plan: Assessment and Plan Assessment & Plan Slow healing medial and lateral residual limb wounds with serosanguineous drainage Wounds on medial and lateral residual limb healing slowly with serosanguineous drainage laterally. Wounds measure 2 cm in diameter and 1 cm deep. No cellulitis or purulent drainage. Improved bleeding post-debridement indicates healing progress. Drainage exceeds shrinker absorption  capacity. - Provide Vosh for dressing changes. - Instruct to wash wound with soap and water , dampen gauze with Vosh, and apply to incision. - Wrap wound with ACE wrap and apply shrinker on top. - Recommend protein supplements to aid healing. - Advise visit to [facility] [address] for [employer] and schedule socket molding appointment in four weeks. - Schedule follow-up in two weeks.      Follow-Up Instructions: No follow-ups on file.   Ortho Exam  Patient is alert, oriented, no adenopathy, well-dressed, normal affect, normal respiratory effort. Physical Exam EXTREMITIES: Slow healing of the medial lateral aspect of the residual limb. No cellulitis or purulent drainage. Clear serosanguineous drainage laterally. Improved bleeding of medial lateral wounds after debridement. Wounds approximately 2 cm in diameter and 1 cm deep.  Patient is a new left transtibial  amputee.  Patient's current comorbidities are not expected to impact the ability to function with the prescribed prosthesis. Patient verbally communicates a strong desire to use a prosthesis. Patient currently requires mobility aids to ambulate without a prosthesis.  Expects not to use mobility aids with a new prosthesis. Patient is expected to resume or reach their K Level within 6 months. Patient was active before the amputation and independent with stairs, uneven terrain, varying cadence, and a community ambulator.  Patient is a K2 level ambulator that will use a prosthesis to walk around their home and the community over low level environmental barriers.  Imaging: No results found. No images are attached to the encounter.  Labs: Lab Results  Component Value Date   HGBA1C 5.5 10/28/2023   HGBA1C 6.4 (H) 08/02/2022   HGBA1C 11.0 (H) 01/11/2020   ESRSEDRATE 136 (H) 11/11/2023   ESRSEDRATE 113 (H) 11/04/2023   ESRSEDRATE 136 (H) 01/14/2020   CRP 21.8 (H) 11/11/2023   CRP 7.4 (H) 11/04/2023   CRP 11.3  (H) 01/14/2020   LABURIC 7.7 (H) 02/04/2022   REPTSTATUS 11/17/2023 FINAL 11/15/2023   GRAMSTAIN  11/15/2023    ABUNDANT WBC PRESENT,BOTH PMN AND MONONUCLEAR NO ORGANISMS SEEN    CULT  11/15/2023    RARE Normal respiratory flora-no Staph aureus or Pseudomonas seen Performed at [facility], [address] [place], [place] [address]      Lab Results  Component Value Date   ALBUMIN  3.1 (L) 12/15/2023   ALBUMIN  3.2 (L) 11/30/2023   ALBUMIN  2.8 (L) 11/27/2023    Lab Results  Component Value Date   MG 1.9 12/17/2023   MG 2.3 12/02/2023   MG 2.0 11/29/2023   Lab Results  Component Value Date   VD25OH 50.60 10/31/2023   VD25OH 42.44 10/30/2023    No results found for: PREALBUMIN    Latest Ref Rng & Units 12/18/2023    4:02 AM 12/17/2023   12:59 PM 12/17/2023    5:22 AM  CBC EXTENDED  WBC 4.0 - 10.5 K/uL 6.0     RBC 3.87 - 5.11 MIL/uL 2.83     Hemoglobin 12.0 - 15.0 g/dL 8.4  8.7  6.7   HCT 63.9 - 46.0 % 27.6  27.4  23.5   Platelets 150 - 400 K/uL 271        There is no height or weight on file to calculate BMI.  Orders:  No orders of the defined types were placed in this encounter.  No orders of the defined types were placed in this encounter.    Procedures: No procedures performed  Clinical Data: No additional findings.  ROS:  All other systems negative, except as noted in the HPI. Review of Systems  Objective: Vital Signs: There were no vitals taken for this visit.  Specialty Comments:  No specialty comments available.  PMFS History: Patient Active Problem List   Diagnosis Date Noted   Acute on chronic systolic (congestive) heart failure (HCC) 12/15/2023   Chronic anemia 12/15/2023   Gross hematuria 11/30/2023   Clot retention of urine 11/30/2023   Advanced care planning/counseling discussion 11/29/2023   Goals of care, counseling/discussion 11/29/2023   Palliative care by specialist 11/29/2023   Chronic kidney disease, stage 3a  (HCC) 11/23/2023   Cardiogenic shock (HCC) 11/16/2023   Acute respiratory failure with hypoxia (HCC) 11/13/2023   Anxiety state 11/12/2023   Acute on chronic systolic CHF (congestive heart failure) (HCC) 11/12/2023   Pressure injury of skin 11/11/2023   Unilateral complete BKA, left, sequela (HCC) 10/29/2023   S/P BKA (below knee amputation) unilateral, left (HCC) 10/29/2023   Necrotizing fasciitis (HCC) 10/22/2023   Gangrene of left foot (HCC) 10/21/2023   MRSA bacteremia 10/21/2023   Sepsis (HCC) 10/20/2023   AKI (acute kidney injury) (HCC) 10/20/2023   Diffuse pain in left lower extremity 10/20/2023   History of GI bleed 10/20/2023   Elevated troponin 10/20/2023   Hypokalemia 10/20/2023   History of coronary artery disease 10/20/2023   Prolonged QT interval 10/20/2023   AVM (arteriovenous malformation) of small bowel, acquired 04/07/2023   Antiplatelet or antithrombotic  long-term use 04/04/2023   Iron  deficiency anemia 04/04/2023   Acute on chronic diastolic heart failure (HCC) 04/03/2023   Osteomyelitis of foot, right, acute (HCC) 12/12/2022   CAD (coronary artery disease) 11/06/2022   Aortic stenosis 11/06/2022   Mitral regurgitation 08/27/2022   NSTEMI (non-ST elevated myocardial infarction) (HCC) 08/18/2022   Acute clinical systolic heart failure (HCC) 08/18/2022   Acute blood loss anemia 08/18/2022   Anemia 08/18/2022   Critical limb ischemia of both lower extremities (HCC) 06/19/2022   Type 2 diabetes mellitus with hyperlipidemia (HCC) 01/28/2020   Hyperlipidemia 01/28/2020   Hypertension 01/28/2020   Hypothyroidism 01/28/2020   Obesity, class 1 01/28/2020   Ulcerated, foot, right, limited to breakdown of skin (HCC)    Cellulitis of right foot    PVD (peripheral vascular disease) (HCC)    Cellulitis 01/11/2020   Blister of leg 01/06/2020   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/28/2017   Chest pain 10/28/2017   GERD (gastroesophageal reflux disease) 10/28/2017    Past Medical History:  Diagnosis Date   Anemia    Aortic stenosis    Arthritis    CHF (congestive heart failure) (HCC)    Coronary artery disease    Diabetes mellitus without complication (HCC)    type 2   GERD (gastroesophageal reflux disease)    Heart murmur    History of blood transfusion    Hypercholesteremia    Hypertension    Hypothyroidism    Mitral regurgitation    Myocardial infarction (HCC)    Peripheral vascular disease (HCC)    PONV (postoperative nausea and vomiting)    Thyroid disease     Family History  Problem Relation Age of Onset   Diabetes Other    CAD Other     Past Surgical History:  Procedure Laterality Date   ABDOMINAL AORTOGRAM W/LOWER EXTREMITY Bilateral 01/14/2020   Procedure: ABDOMINAL AORTOGRAM W/LOWER EXTREMITY;  Surgeon: Gretta Lonni PARAS, MD;  Location: [facility];  Service: Cardiovascular;  Laterality: Bilateral;   ABDOMINAL AORTOGRAM W/LOWER EXTREMITY N/A 07/05/2022   Procedure: ABDOMINAL AORTOGRAM W/LOWER EXTREMITY;  Surgeon: Gretta Lonni PARAS, MD;  Location: [facility];  Service: Cardiovascular;  Laterality: N/A;   ABDOMINAL AORTOGRAM W/LOWER EXTREMITY N/A 08/02/2022   Procedure: ABDOMINAL AORTOGRAM W/LOWER EXTREMITY;  Surgeon: Gretta Lonni PARAS, MD;  Location: [facility];  Service: Cardiovascular;  Laterality: N/A;   ABDOMINAL AORTOGRAM W/LOWER EXTREMITY N/A 08/06/2022   Procedure: ABDOMINAL AORTOGRAM W/LOWER EXTREMITY;  Surgeon: Gretta Lonni PARAS, MD;  Location: [facility];  Service: Cardiovascular;  Laterality: N/A;   AMPUTATION Bilateral 07/20/2022   Procedure: AMPUTATION RIGHT GREAT TOE AND SECOND TOE, AMPUTATION LEFT GREAT TOE;  Surgeon: Harden Jerona GAILS, MD;  Location: [facility];  Service: Orthopedics;  Laterality: Bilateral;   AMPUTATION Bilateral 08/08/2022   Procedure: BILATERAL TRANSMETATARSAL AMPUTATION;  Surgeon: Harden Jerona GAILS, MD;  Location: [facility] OR;  Service: Orthopedics;  Laterality: Bilateral;    AMPUTATION Left 10/22/2023   Procedure: AMPUTATION BELOW KNEE;  Surgeon: Lanis Fonda BRAVO, MD;  Location: [facility] OR;  Service: Vascular;  Laterality: Left;   AMPUTATION TOE     BIOPSY  04/06/2023   Procedure: BIOPSY;  Surgeon: Brenlynn Elspeth SQUIBB, MD;  Location: [facility] ENDOSCOPY;  Service: Gastroenterology;;   COLONOSCOPY WITH PROPOFOL  N/A 04/06/2023   Procedure: COLONOSCOPY WITH PROPOFOL ;  Surgeon: Natsuko Elspeth SQUIBB, MD;  Location: [facility];  Service: Gastroenterology;  Laterality: N/A;   CYSTOSCOPY WITH STENT PLACEMENT N/A 11/30/2023  Procedure: CYSTOSCOPY, WITH FULGURATION OF BLEEDING AND CLOT EVACUATION;  Surgeon: Roseann Adine PARAS., MD;  Location: [facility];  Service: Urology;  Laterality: N/A;  CLOT EVACUATION OF BLADDER   ESOPHAGOGASTRODUODENOSCOPY (EGD) WITH PROPOFOL  N/A 04/06/2023   Procedure: ESOPHAGOGASTRODUODENOSCOPY (EGD) WITH PROPOFOL ;  Surgeon: Arlys Elspeth SQUIBB, MD;  Location: [facility];  Service: Gastroenterology;  Laterality: N/A;   GIVENS CAPSULE STUDY N/A 04/06/2023   Procedure: GIVENS CAPSULE STUDY;  Surgeon: Kyndell Elspeth SQUIBB, MD;  Location: [facility] ENDOSCOPY;  Service: Gastroenterology;  Laterality: N/A;   PERIPHERAL VASCULAR ATHERECTOMY Right 01/14/2020   Procedure: PERIPHERAL VASCULAR ATHERECTOMY;  Surgeon: Gretta Lonni PARAS, MD;  Location: [facility] IN[facility] CV LAB;  Service: Cardiovascular;  Laterality: Right;  sfa   PERIPHERAL VASCULAR INTERVENTION Right 01/14/2020   Procedure: PERIPHERAL VASCULAR INTERVENTION;  Surgeon: Gretta Lonni PARAS, MD;  Location: [facility];  Service: Cardiovascular;  Laterality: Right;  sfa stent    PERIPHERAL VASCULAR INTERVENTION  07/05/2022   Procedure: PERIPHERAL VASCULAR INTERVENTION;  Surgeon: Gretta Lonni PARAS, MD;  Location: [facility];  Service: Cardiovascular;;   PERIPHERAL VASCULAR INTERVENTION  08/02/2022   Procedure: PERIPHERAL VASCULAR INTERVENTION;  Surgeon: Gretta Lonni PARAS, MD;  Location: [facility];   Service: Cardiovascular;;   PERIPHERAL VASCULAR INTERVENTION  08/06/2022   Procedure: PERIPHERAL VASCULAR INTERVENTION;  Surgeon: Gretta Lonni PARAS, MD;  Location: [facility] INVASIVE CV LAB;  Service: Cardiovascular;;   PERIPHERAL VASCULAR THROMBECTOMY  08/02/2022   Procedure: PERIPHERAL VASCULAR THROMBECTOMY;  Surgeon: Gretta Lonni PARAS, MD;  Location: [facility];  Service: Cardiovascular;;   REVISION AMPUTATION, BELOW THE KNEE Left 10/23/2023   Procedure: REVISION AMPUTATION, BELOW THE KNEE;  Surgeon: Harden Jerona GAILS, MD;  Location: [facility] OR;  Service: Orthopedics;  Laterality: Left;  REVISION LEFT BELOW KNEE AMPUTATION   RIGHT HEART CATH N/A 11/29/2023   Procedure: RIGHT HEART CATH;  Surgeon: Rolan Ezra RAMAN, MD;  Location: [facility] INVASIVE CV LAB;  Service: Cardiovascular;  Laterality: N/A;   RIGHT/LEFT HEART CATH AND CORONARY ANGIOGRAPHY N/A 08/23/2022   Procedure: RIGHT/LEFT HEART CATH AND CORONARY ANGIOGRAPHY;  Surgeon: Dann Candyce RAMAN, MD;  Location: [facility] INVASIVE CV LAB;  Service: Cardiovascular;  Laterality: N/A;   SKIN SPLIT GRAFT Right 03/18/2020   Procedure: SKIN GRAFTING RIGHT FOOT ULCER;  Surgeon: Harden Jerona GAILS, MD;  Location: [facility] OR;  Service: Orthopedics;  Laterality: Right;   STUMP REVISION Right 11/30/2022   Procedure: REVISION RIGHT TRANSMETATARSAL AMPUTATION;  Surgeon: Harden Jerona GAILS, MD;  Location: [facility] OR;  Service: Orthopedics;  Laterality: Right;   TEE WITHOUT CARDIOVERSION N/A 08/27/2022   Procedure: TRANSESOPHAGEAL ECHOCARDIOGRAM;  Surgeon: Hobart Powell BRAVO, MD;  Location: [facility] INVASIVE CV LAB;  Service: Cardiovascular;  Laterality: N/A;   TRANSESOPHAGEAL ECHOCARDIOGRAM (CATH LAB) N/A 10/24/2023   Procedure: TRANSESOPHAGEAL ECHOCARDIOGRAM;  Surgeon: Michele Richardson, DO;  Location: [facility];  Service: Cardiovascular;  Laterality: N/A;   WISDOM TOOTH EXTRACTION     Social History   Occupational History   Not on file  Tobacco Use   Smoking status: Former    Current  packs/day: 0.00    Types: Cigarettes    Quit date: 01/2017    Years since quitting: 6.9   Smokeless tobacco: Never  Vaping Use   Vaping status: Never Used  Substance and Sexual Activity   Alcohol use: Not Currently   Drug use: Yes    Types: Marijuana    Comment: on ocassion- Last time - 11/13/22   Sexual activity:  Yes    Birth control/protection: Post-menopausal

## 2023-12-31 ENCOUNTER — Other Ambulatory Visit: Admitting: Urology

## 2024-01-01 ENCOUNTER — Inpatient Hospital Stay (HOSPITAL_COMMUNITY): Admission: RE | Admit: 2024-01-01 | Source: Ambulatory Visit

## 2024-01-01 ENCOUNTER — Ambulatory Visit: Payer: Self-pay | Admitting: Urology

## 2024-01-02 ENCOUNTER — Encounter: Payer: Self-pay | Admitting: Urology

## 2024-01-02 ENCOUNTER — Other Ambulatory Visit: Admitting: Urology

## 2024-01-02 ENCOUNTER — Ambulatory Visit (INDEPENDENT_AMBULATORY_CARE_PROVIDER_SITE_OTHER): Payer: Self-pay | Admitting: Urology

## 2024-01-02 ENCOUNTER — Telehealth (HOSPITAL_COMMUNITY): Payer: Self-pay

## 2024-01-02 VITALS — BP 129/77 | HR 84

## 2024-01-02 DIAGNOSIS — Z87440 Personal history of urinary (tract) infections: Secondary | ICD-10-CM

## 2024-01-02 DIAGNOSIS — R310 Gross hematuria: Secondary | ICD-10-CM

## 2024-01-02 LAB — URINALYSIS, ROUTINE W REFLEX MICROSCOPIC
Bilirubin, UA: NEGATIVE
Glucose, UA: NEGATIVE
Ketones, UA: NEGATIVE
Nitrite, UA: NEGATIVE
Protein,UA: NEGATIVE
RBC, UA: NEGATIVE
Specific Gravity, UA: 1.01 (ref 1.005–1.030)
Urobilinogen, Ur: 0.2 mg/dL (ref 0.2–1.0)
pH, UA: 5.5 (ref 5.0–7.5)

## 2024-01-02 LAB — MICROSCOPIC EXAMINATION
WBC, UA: >30 /HPF — AB (ref 0–5)
Yeast, UA: POSITIVE — AB

## 2024-01-02 MED ORDER — CIPROFLOXACIN HCL 500 MG PO TABS
500.0000 mg | ORAL_TABLET | Freq: Once | ORAL | Status: AC
  Administered 2024-01-02: 500 mg via ORAL

## 2024-01-02 NOTE — Addendum Note (Signed)
 Addended by: OBADIAH ROSELEE RAMAN on: 01/02/2024 04:28 PM   Modules accepted: Orders

## 2024-01-02 NOTE — Progress Notes (Signed)
 Assessment: 1. Gross hematuria   2. History of UTI     Plan: I personally reviewed the CT study from 12/27/2023 with results as noted below. I discussed the findings on cystoscopy with the patient today. Urine cytology sent. Cipro  x 1 following cystoscopy Return to office in 2 months.  Chief Complaint: Chief Complaint  Patient presents with   Hematuria    HPI: Kayla Schmitt is a 58 y.o. female who presents for continued evaluation of gross hematuria with clot retention. She was seen as a consult in the hospital on 11/30/2023.  She has multiple medical problems and was in the ICU at that time.  She had a prolonged hospitalization from 11/12/2023 - 12/06/2023.  She apparently developed gross hematuria.  Urinalysis from 11/26/2023 showed >50 RBCs, 0-5 WBCs and many bacteria.  No culture was sent.  She was started on CBI which was not functioning.  She was taken to the operating room on 11/30/2023 where she underwent an extensive clot evacuation.  The clot was well-formed requiring resection in order to remove all visible clot material.  She was continued on CBI postoperatively.  Her urine remained clear and the Foley catheter was removed prior to her discharge from the hospital on 12/05/2023. At her visit in 8/25, she reported that her urine was clear for several days after her discharge from the hospital.  She then developed some gross hematuria which resolved.  She had recurrence of the gross hematuria for 2 days and was passing some small clots.  No dysuria or flank pain.  She was seen by her PCP and started on cefuroxime.   Urine culture from 12/12/2023 grew >100 K Enterobacter.  Treated with Ceftin.  CT abdomen and pelvis without contrast from 12/27/2023 showed no renal or ureteral calculi, no hydronephrosis, and a grossly unremarkable urinary bladder.  She presents today for further evaluation with cystoscopy.   Portions of the above documentation were copied from a prior visit for  review purposes only.  Allergies: Allergies  Allergen Reactions   Trental  [Pentoxifylline ] Nausea And Vomiting   Vibramycin [Doxycycline] Nausea And Vomiting    PMH: Past Medical History:  Diagnosis Date   Anemia    Aortic stenosis    Arthritis    CHF (congestive heart failure) (HCC)    Coronary artery disease    Diabetes mellitus without complication (HCC)    type 2   GERD (gastroesophageal reflux disease)    Heart murmur    History of blood transfusion    Hypercholesteremia    Hypertension    Hypothyroidism    Mitral regurgitation    Myocardial infarction (HCC)    Peripheral vascular disease (HCC)    PONV (postoperative nausea and vomiting)    Thyroid disease     PSH: Past Surgical History:  Procedure Laterality Date   ABDOMINAL AORTOGRAM W/LOWER EXTREMITY Bilateral 01/14/2020   Procedure: ABDOMINAL AORTOGRAM W/LOWER EXTREMITY;  Surgeon: Gretta Lonni PARAS, MD;  Location: [facility];  Service: Cardiovascular;  Laterality: Bilateral;   ABDOMINAL AORTOGRAM W/LOWER EXTREMITY N/A 07/05/2022   Procedure: ABDOMINAL AORTOGRAM W/LOWER EXTREMITY;  Surgeon: Gretta Lonni PARAS, MD;  Location: [facility];  Service: Cardiovascular;  Laterality: N/A;   ABDOMINAL AORTOGRAM W/LOWER EXTREMITY N/A 08/02/2022   Procedure: ABDOMINAL AORTOGRAM W/LOWER EXTREMITY;  Surgeon: Gretta Lonni PARAS, MD;  Location: [facility];  Service: Cardiovascular;  Laterality: N/A;   ABDOMINAL AORTOGRAM W/LOWER EXTREMITY N/A 08/06/2022   Procedure: ABDOMINAL AORTOGRAM W/LOWER EXTREMITY;  Surgeon: Gretta Lonni PARAS, MD;  Location: [facility] INVASIVE CV LAB;  Service: Cardiovascular;  Laterality: N/A;   AMPUTATION Bilateral 07/20/2022   Procedure: AMPUTATION RIGHT GREAT TOE AND SECOND TOE, AMPUTATION LEFT GREAT TOE;  Surgeon: Harden Jerona GAILS, MD;  Location: [facility] OR;  Service: Orthopedics;  Laterality: Bilateral;   AMPUTATION Bilateral 08/08/2022   Procedure: BILATERAL TRANSMETATARSAL AMPUTATION;   Surgeon: Harden Jerona GAILS, MD;  Location: [facility] OR;  Service: Orthopedics;  Laterality: Bilateral;   AMPUTATION Left 10/22/2023   Procedure: AMPUTATION BELOW KNEE;  Surgeon: Lanis Fonda BRAVO, MD;  Location: [facility] OR;  Service: Vascular;  Laterality: Left;   AMPUTATION TOE     BIOPSY  04/06/2023   Procedure: BIOPSY;  Surgeon: Nick Elspeth SQUIBB, MD;  Location: [facility] ENDOSCOPY;  Service: Gastroenterology;;   COLONOSCOPY WITH PROPOFOL  N/A 04/06/2023   Procedure: COLONOSCOPY WITH PROPOFOL ;  Surgeon: Cozette Elspeth SQUIBB, MD;  Location: [facility] ENDOSCOPY;  Service: Gastroenterology;  Laterality: N/A;   CYSTOSCOPY WITH STENT PLACEMENT N/A 11/30/2023   Procedure: CYSTOSCOPY, WITH FULGURATION OF BLEEDING AND CLOT EVACUATION;  Surgeon: Roseann Adine PARAS., MD;  Location: [facility] OR;  Service: Urology;  Laterality: N/A;  CLOT EVACUATION OF BLADDER   ESOPHAGOGASTRODUODENOSCOPY (EGD) WITH PROPOFOL  N/A 04/06/2023   Procedure: ESOPHAGOGASTRODUODENOSCOPY (EGD) WITH PROPOFOL ;  Surgeon: Reaghan Elspeth SQUIBB, MD;  Location: [facility] ENDOSCOPY;  Service: Gastroenterology;  Laterality: N/A;   GIVENS CAPSULE STUDY N/A 04/06/2023   Procedure: GIVENS CAPSULE STUDY;  Surgeon: Halona Elspeth SQUIBB, MD;  Location: [facility] ENDOSCOPY;  Service: Gastroenterology;  Laterality: N/A;   PERIPHERAL VASCULAR ATHERECTOMY Right 01/14/2020   Procedure: PERIPHERAL VASCULAR ATHERECTOMY;  Surgeon: Gretta Lonni PARAS, MD;  Location: [facility] INVASIVE CV LAB;  Service: Cardiovascular;  Laterality: Right;  sfa   PERIPHERAL VASCULAR INTERVENTION Right 01/14/2020   Procedure: PERIPHERAL VASCULAR INTERVENTION;  Surgeon: Gretta Lonni PARAS, MD;  [facility];  Service: Cardiovascular;  Laterality: Right;  sfa stent    PERIPHERAL VASCULAR INTERVENTION  07/05/2022   Procedure: PERIPHERAL VASCULAR INTERVENTION;  Surgeon: Gretta Lonni PARAS, MD;  [facility]: [facility];  Service: Cardiovascular;;   PERIPHERAL VASCULAR INTERVENTION  08/02/2022   Procedure:  PERIPHERAL VASCULAR INTERVENTION;  Surgeon: Gretta Lonni PARAS, MD;  [facility];  Service: Cardiovascular;;   PERIPHERAL VASCULAR INTERVENTION  08/06/2022   Procedure: PERIPHERAL VASCULAR INTERVENTION;  Surgeon: Gretta Lonni PARAS, MD;  Location: [facility] INVASIVE CV LAB;  Service: Cardiovascular;;   PERIPHERAL VASCULAR THROMBECTOMY  08/02/2022   Procedure: PERIPHERAL VASCULAR THROMBECTOMY;  Surgeon: Gretta Lonni PARAS, MD;  [facility];  Service: Cardiovascular;;   REVISION AMPUTATION, BELOW THE KNEE Left 10/23/2023   Procedure: REVISION AMPUTATION, BELOW THE KNEE;  Surgeon: Harden Jerona GAILS, MD;  Location: [facility] OR;  Service: Orthopedics;  Laterality: Left;  REVISION LEFT BELOW KNEE AMPUTATION   RIGHT HEART CATH N/A 11/29/2023   Procedure: RIGHT HEART CATH;  Surgeon: Rolan Ezra RAMAN, MD;  Location: [facility] INVASIVE CV LAB;  Service: Cardiovascular;  Laterality: N/A;   RIGHT/LEFT HEART CATH AND CORONARY ANGIOGRAPHY N/A 08/23/2022   Procedure: RIGHT/LEFT HEART CATH AND CORONARY ANGIOGRAPHY;  Surgeon: Dann Candyce RAMAN, MD;  Location: [facility] INVASIVE CV LAB;  Service: Cardiovascular;  Laterality: N/A;   SKIN SPLIT GRAFT Right 03/18/2020   Procedure: SKIN GRAFTING RIGHT FOOT ULCER;  Surgeon: Harden Jerona GAILS, MD;  Location: [facility] OR;  Service: Orthopedics;  Laterality: Right;   STUMP REVISION Right 11/30/2022   Procedure: REVISION RIGHT TRANSMETATARSAL AMPUTATION;  Surgeon: Harden Jerona  V, MD;  Location: [facility] OR;  Service: Orthopedics;  Laterality: Right;   TEE WITHOUT CARDIOVERSION N/A 08/27/2022   Procedure: TRANSESOPHAGEAL ECHOCARDIOGRAM;  Surgeon: Hobart Powell BRAVO, MD;  Location: [facility] INVASIVE CV LAB;  Service: Cardiovascular;  Laterality: N/A;   TRANSESOPHAGEAL ECHOCARDIOGRAM (CATH LAB) N/A 10/24/2023   Procedure: TRANSESOPHAGEAL ECHOCARDIOGRAM;  Surgeon: Michele Richardson, DO;  Location: [facility];  Service: Cardiovascular;  Laterality: N/A;   WISDOM TOOTH EXTRACTION       SH: Social History   Tobacco Use   Smoking status: Former    Current packs/day: 0.00    Types: Cigarettes    Quit date: 01/2017    Years since quitting: 6.9   Smokeless tobacco: Never  Vaping Use   Vaping status: Never Used  Substance Use Topics   Alcohol use: Not Currently   Drug use: Yes    Types: Marijuana    Comment: on ocassion- Last time - 11/13/22    ROS: Constitutional:  Negative for fever, chills, weight loss CV: Negative for chest pain, previous MI, hypertension Respiratory:  Negative for shortness of breath, wheezing, sleep apnea, frequent cough GI:  Negative for nausea, vomiting, bloody stool, GERD  PE: BP 129/77   Pulse 84  GENERAL APPEARANCE:  Well appearing, well developed, well nourished, NAD HEENT:  Atraumatic, normocephalic, oropharynx clear NECK:  Supple without lymphadenopathy or thyromegaly ABDOMEN:  Soft, non-tender, no masses EXTREMITIES:  Moves all extremities well, without clubbing, cyanosis, or edema NEUROLOGIC:  Alert and oriented x 3, normal gait, CN II-XII grossly intact MENTAL STATUS:  appropriate BACK:  Non-tender to palpation, No CVAT SKIN:  Warm, dry, and intact   Results: U/A: >30 WBC, 0-2 RBC, few bacteria  CYSTOSCOPY  Procedure: Flexible cystoscopy  Pre-Operative Diagnosis:  Gross hematuria  Post-Operative Diagnosis: Gross hematuria  Anesthesia: local with lidocaine  gel  Surgical Narrative:  After appropriate informed consent was obtained, the patient was prepped and draped in the usual sterile fashion in the supine position. She was correctly identified and the proper procedure delineated prior to proceeding. Sterile lidocaine  gel was instilled in the urethra.  The flexible cystoscope was introduced without difficulty.  Findings:  Urethra: Normal  Bladder: Several areas on the bladder base and posterior bladder wall with fibrinous changes consistent with prior mucosal trauma from catheters and  cystoscopy  Ureteral orifices: Clear efflux bilaterally  Additional findings: none  A bladder wash was obtained for cytology.  She tolerated the procedure well.  A chaperone was present throughout the procedure.

## 2024-01-02 NOTE — Telephone Encounter (Signed)
 Called to confirm/remind patient of their appointment at the [facility] on 01/02/24.   Appointment:   [x] Confirmed  [] Left mess   [] No answer/No voice mail  [] VM Full/unable to leave message  [] Phone not in service  Patient reminded to bring all medications and/or complete list.  Confirmed patient has transportation. Gave directions, instructed to utilize valet parking.

## 2024-01-03 ENCOUNTER — Encounter (HOSPITAL_COMMUNITY): Payer: Self-pay

## 2024-01-03 ENCOUNTER — Ambulatory Visit (HOSPITAL_COMMUNITY): Payer: Self-pay | Admitting: Family Medicine

## 2024-01-03 ENCOUNTER — Inpatient Hospital Stay (HOSPITAL_COMMUNITY)
Admission: RE | Admit: 2024-01-03 | Discharge: 2024-01-03 | Disposition: A | Discharge status: Home / Self Care | Source: Ambulatory Visit | Attending: Internal Medicine | Admitting: Internal Medicine

## 2024-01-03 ENCOUNTER — Ambulatory Visit (HOSPITAL_COMMUNITY)
Admission: RE | Admit: 2024-01-03 | Discharge: 2024-01-03 | Disposition: A | Discharge status: Home / Self Care | Source: Ambulatory Visit | Attending: Family Medicine | Admitting: Family Medicine

## 2024-01-03 ENCOUNTER — Other Ambulatory Visit (HOSPITAL_COMMUNITY): Payer: Self-pay | Admitting: Internal Medicine

## 2024-01-03 VITALS — BP 104/62 | HR 80 | Ht 64.0 in | Wt 190.0 lb

## 2024-01-03 DIAGNOSIS — E7800 Pure hypercholesterolemia, unspecified: Secondary | ICD-10-CM | POA: Diagnosis not present

## 2024-01-03 DIAGNOSIS — I080 Rheumatic disorders of both mitral and aortic valves: Secondary | ICD-10-CM | POA: Insufficient documentation

## 2024-01-03 DIAGNOSIS — Z794 Long term (current) use of insulin: Secondary | ICD-10-CM | POA: Diagnosis not present

## 2024-01-03 DIAGNOSIS — D649 Anemia, unspecified: Secondary | ICD-10-CM

## 2024-01-03 DIAGNOSIS — I493 Ventricular premature depolarization: Secondary | ICD-10-CM

## 2024-01-03 DIAGNOSIS — R7881 Bacteremia: Secondary | ICD-10-CM | POA: Insufficient documentation

## 2024-01-03 DIAGNOSIS — M726 Necrotizing fasciitis: Secondary | ICD-10-CM | POA: Diagnosis not present

## 2024-01-03 DIAGNOSIS — E1151 Type 2 diabetes mellitus with diabetic peripheral angiopathy without gangrene: Secondary | ICD-10-CM | POA: Diagnosis not present

## 2024-01-03 DIAGNOSIS — N1831 Chronic kidney disease, stage 3a: Secondary | ICD-10-CM | POA: Diagnosis not present

## 2024-01-03 DIAGNOSIS — I38 Endocarditis, valve unspecified: Secondary | ICD-10-CM

## 2024-01-03 DIAGNOSIS — I2510 Atherosclerotic heart disease of native coronary artery without angina pectoris: Secondary | ICD-10-CM | POA: Insufficient documentation

## 2024-01-03 DIAGNOSIS — N183 Chronic kidney disease, stage 3 unspecified: Secondary | ICD-10-CM

## 2024-01-03 DIAGNOSIS — I252 Old myocardial infarction: Secondary | ICD-10-CM | POA: Insufficient documentation

## 2024-01-03 DIAGNOSIS — E1122 Type 2 diabetes mellitus with diabetic chronic kidney disease: Secondary | ICD-10-CM | POA: Diagnosis not present

## 2024-01-03 DIAGNOSIS — E8770 Fluid overload, unspecified: Secondary | ICD-10-CM | POA: Diagnosis not present

## 2024-01-03 DIAGNOSIS — I5020 Unspecified systolic (congestive) heart failure: Secondary | ICD-10-CM | POA: Diagnosis not present

## 2024-01-03 DIAGNOSIS — I739 Peripheral vascular disease, unspecified: Secondary | ICD-10-CM | POA: Insufficient documentation

## 2024-01-03 DIAGNOSIS — Z7982 Long term (current) use of aspirin: Secondary | ICD-10-CM | POA: Diagnosis not present

## 2024-01-03 DIAGNOSIS — Z86718 Personal history of other venous thrombosis and embolism: Secondary | ICD-10-CM | POA: Insufficient documentation

## 2024-01-03 DIAGNOSIS — Z89512 Acquired absence of left leg below knee: Secondary | ICD-10-CM | POA: Insufficient documentation

## 2024-01-03 DIAGNOSIS — Z993 Dependence on wheelchair: Secondary | ICD-10-CM | POA: Insufficient documentation

## 2024-01-03 DIAGNOSIS — Z87891 Personal history of nicotine dependence: Secondary | ICD-10-CM | POA: Diagnosis not present

## 2024-01-03 DIAGNOSIS — D631 Anemia in chronic kidney disease: Secondary | ICD-10-CM | POA: Diagnosis not present

## 2024-01-03 DIAGNOSIS — N320 Bladder-neck obstruction: Secondary | ICD-10-CM | POA: Diagnosis not present

## 2024-01-03 DIAGNOSIS — I130 Hypertensive heart and chronic kidney disease with heart failure and stage 1 through stage 4 chronic kidney disease, or unspecified chronic kidney disease: Secondary | ICD-10-CM | POA: Insufficient documentation

## 2024-01-03 DIAGNOSIS — I5022 Chronic systolic (congestive) heart failure: Secondary | ICD-10-CM | POA: Insufficient documentation

## 2024-01-03 DIAGNOSIS — B9562 Methicillin resistant Staphylococcus aureus infection as the cause of diseases classified elsewhere: Secondary | ICD-10-CM | POA: Insufficient documentation

## 2024-01-03 DIAGNOSIS — Z79899 Other long term (current) drug therapy: Secondary | ICD-10-CM | POA: Diagnosis not present

## 2024-01-03 LAB — BASIC METABOLIC PANEL WITH GFR
Anion gap: 13 (ref 5–15)
BUN: 68 mg/dL — ABNORMAL HIGH (ref 6–20)
CO2: 26 mmol/L (ref 22–32)
Calcium: 9.6 mg/dL (ref 8.9–10.3)
Chloride: 100 mmol/L (ref 98–111)
Creatinine, Ser: 1.6 mg/dL — ABNORMAL HIGH (ref 0.44–1.00)
GFR, Estimated: 37 mL/min — ABNORMAL LOW (ref >60)
Glucose, Bld: 173 mg/dL — ABNORMAL HIGH (ref 70–99)
Potassium: 3.2 mmol/L — ABNORMAL LOW (ref 3.5–5.1)
Sodium: 139 mmol/L (ref 135–145)

## 2024-01-03 LAB — CBC
HCT: 28 % — ABNORMAL LOW (ref 36.0–46.0)
Hemoglobin: 8.8 g/dL — ABNORMAL LOW (ref 12.0–15.0)
MCH: 29 pg (ref 26.0–34.0)
MCHC: 31.4 g/dL (ref 30.0–36.0)
MCV: 92.4 fL (ref 80.0–100.0)
Platelets: 255 K/uL (ref 150–400)
RBC: 3.03 MIL/uL — ABNORMAL LOW (ref 3.87–5.11)
RDW: 14.8 % (ref 11.5–15.5)
WBC: 7 K/uL (ref 4.0–10.5)
nRBC: 0 % (ref 0.0–0.2)

## 2024-01-03 LAB — TSH: TSH: 1.592 u[IU]/mL (ref 0.350–4.500)

## 2024-01-03 LAB — BRAIN NATRIURETIC PEPTIDE: B Natriuretic Peptide: 1131.9 pg/mL — ABNORMAL HIGH (ref 0.0–100.0)

## 2024-01-03 LAB — MAGNESIUM: Magnesium: 2.1 mg/dL (ref 1.7–2.4)

## 2024-01-03 MED ORDER — TORSEMIDE 20 MG PO TABS: 80.0000 mg | ORAL_TABLET | Freq: Two times a day (BID) | ORAL | 0 refills | Status: DC

## 2024-01-03 MED ORDER — ASPIRIN 81 MG PO TBEC: 81.0000 mg | DELAYED_RELEASE_TABLET | Freq: Every day | ORAL | 3 refills | Status: AC

## 2024-01-03 MED ORDER — CLOPIDOGREL BISULFATE 75 MG PO TABS
75.0000 mg | ORAL_TABLET | Freq: Every day | ORAL | 3 refills | Status: AC
Start: 2024-01-03 — End: ?

## 2024-01-03 NOTE — Progress Notes (Signed)
 ReDS Vest / Clip - 01/03/24 1408       ReDS Vest / Clip   Station Marker A    Ruler Value 29    ReDS Value Range High volume overload    ReDS Actual Value 48

## 2024-01-03 NOTE — Progress Notes (Signed)
  Heart and Vascular Care Navigation  01/03/2024  Emaleigh Guimond 1965/12/12 [id number]  Reason for Referral: Referred to discuss disability questions.  Pt had to stop working over summer due to health condition- is on FMLA from work but thinks this ends Oct 27th.  Has STD which she thinks is allowed to pay through Dec. 16th but not sure if it would still pay out if she is no longer employed.  Unsure if she has LTD benefit through work- encouraged her to check.  Has phone appt with [employer] on Sept 23rd to apply for disability- trying to apply online but running into barriers with making account.  Understands that SSDI takes 6-9 months to make determination- would not have sufficient savings to keep up with expenses for that long- is working on a plan B if she can't return to work.  Has a potential 10,000 pay out from a critical care plan through her work.  Pt also worried about losing insurance.  States she might get COBRA pending cost.  CSW provided with information regarding Medicaid and [employer] insurance.    Engaged with patient face to face for initial visit for Heart and Vascular Care Coordination.                                                                                                   Assessment:                                      HRT/VAS Care Coordination     Living arrangements for the past 2 months Single Family Home   Lives with: Self   Home Assistive Devices/Equipment CBG Meter; Blood pressure cuff; Scales   DME Agency NA   [facility]   Current home services DME; Home PT; Home OT; Home RN       Social History:                                                                             SDOH Screenings   Food Insecurity: No Food Insecurity (12/15/2023)  Housing: Low Risk  (12/15/2023)  Transportation Needs: No Transportation Needs (12/15/2023)  Utilities: Not At Risk (12/15/2023)  Financial Resource Strain: Low Risk  (10/19/2023)   Received from  [facility]  Social Connections: Unknown (12/15/2023)  Tobacco Use: Medium Risk (01/03/2024)     Follow-up plan:    CSW will continue to follow and assist as needed to navigate insurance and disability concerns as they arise.  Andriette HILARIO Leech, LCSW Clinical Social Worker [facility] Desk#: [phone] Cell#: [phone]

## 2024-01-03 NOTE — Patient Instructions (Addendum)
 Good to see you today!  Your physician has requested that you have an echocardiogram. Echocardiography is a painless test that uses sound waves to create images of your heart. It provides your doctor with information about the size and shape of your heart and how well your heart's chambers and valves are working. This procedure takes approximately one hour. There are no restrictions for this procedure. Please do NOT wear cologne, perfume, aftershave, or lotions (deodorant is allowed). Please arrive 15 minutes prior to your appointment time.  Please note: We ask at that you not bring children with you during ultrasound (echo/ vascular) testing. Due to room size and safety concerns, children are not allowed in the ultrasound rooms during exams. Our front office staff cannot provide observation of children in our lobby area while testing is being conducted. An adult accompanying a patient to their appointment will only be allowed in the ultrasound room at the discretion of the ultrasound technician under special circumstances. We apologize for any inconvenience.  Labs done today, your results will be available in MyChart, we will contact you for abnormal readings.  Your provider has recommended that  you wear a Zio Patch for 14 days.  This monitor will record your heart rhythm for our review.  IF you have any symptoms while wearing the monitor please press the button.  If you have any issues with the patch or you notice a red or orange light on it please call the company at [phone].  Once you remove the patch please mail it back to the company as soon as possible so we can get the results.  You have been referred to structural heart that office will call to schedule an appointment  Your physician recommends that you schedule a follow-up appointment 3 weeks with app clinic and 3 months with echocardiogram for Dr. Bensimhon  Notify us  if 3 lb increase in 24 hours or 5 lbs in a week  If you have  any questions or concerns before your next appointment please send us  a message through [place] or call our office at [phone].    TO LEAVE A MESSAGE FOR THE NURSE SELECT OPTION 2, PLEASE LEAVE A MESSAGE INCLUDING: YOUR NAME DATE OF BIRTH CALL BACK NUMBER REASON FOR CALL**this is important as we prioritize the call backs  YOU WILL RECEIVE A CALL BACK THE SAME DAY AS LONG AS YOU CALL BEFORE 4:00 PM At the [facility], you and your health needs are our priority. As part of our continuing mission to provide you with exceptional heart care, we have created designated Provider Care Teams. These Care Teams include your primary Cardiologist (physician) and Advanced Practice Providers (APPs- Physician Assistants and Nurse Practitioners) who all work together to provide you with the care you need, when you need it.   You may see any of the following providers on your designated Care Team at your next follow up: Dr Toribio Fuel Dr Ezra Shuck Dr. Ria Commander Dr. Morene Brownie [name] Lenetta, NP Caffie Shed, [place] [facility], [place] Beckey Coe, NP [place] Lee, NP Ellouise Class, NP Tinnie Redman, PharmD Jaun Bash, PharmD   Please be sure to bring in all your medications bottles to every appointment.    Thank you for choosing [facility]

## 2024-01-03 NOTE — Progress Notes (Signed)
 [facility] CONSULT NOTE   PCP: [name], NP Primary Cardiologist: Diannah SHAUNNA Maywood, MD HF Cardiologist: Dr. Cherrie  HPI: 58 y.o. 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    Admitted 4/24 with NSTEMI. Cath showed 3V CAD and significant MR. CABG and MVR recommended, but has not had surgery d/t multiple medical issues.   Admitted 6/25 for MRSA bacteremia and necrotizing fasciitis. Required L BKA followed by revision. TEE 6/25 with EF 60-65%, mild to moderate MR and moderate to severe AS, but no evidence of endocarditis. Four week course of IV abx with daptomycin  recommended. Discharged 10/29/23 to [facility] for rehab.   Transferred back to [facility] 7/25 with a/c HF, and concern for low-output with acute respiratory failure. She was diuresed with IV lasix , but SCr continued to rise. Arranged for RHC but case aborted d/t significant dyspnea w/ hypoxia and inability to lie flat. She was diuresed and ultimately required intubation for flash pulmonary edema, and DBA gtt. Renal function continued to decline and she transiently required CRRT. Eventually underwent RHC showing severely elevated PCWP, mildly elevated RAP, preserved CO. No ischemic eval due to AKI. Course complicated by hematuria, requiring PRBCs. She went to OR for cystoscopy and declotting of bladder. She was able to be extubated, drips weaned and GDMT titrated, however limited by soft BP and creatinine. She was discharged home, weight 184 lbs.  Today she returns for post hospital HF follow up with her mother. Overall feeling fine. She is WC bound but able to complete ADLs without dyspnea. She has prosthetic fitting later this month. She is driving. She does not weigh at home, but measures right calf daily and has no fluctuations in measurements. Denies palpitations, abnormal bleeding, CP, dizziness, edema, or PND/Orthopnea. Appetite ok. Taking all  medications. No tobacco, ETOH or drug use.  Cardiac Studies  - RHC (8/25): RA 8, PA 60/25 (40), PCWP mean 29 with prominent v waves to 43, CO/CI (Fick) 5.28/2.77, PVR 2.1 WU  - Ltd echo (7/25): EF 25-30%, G2DD, mild to moderate MR, moderate to severe AS  - Echo (7/25): EF 25%, RV normal, mild to moderate MR, mild AS  - TEE (6/25): LVEF 60-65%, RV normal, no thrombus, moderate to severe AS (mean gradient 27 mmHg, AVA per VTI 1.07 cm)  - R/LHC (4/24): severe 3v CAD, severe MR; RA 9, PA 42/24 (32), PCWP 24 with v waves to 45, CO/CI (Fick) 8.58/4.26  Past Medical History:  Diagnosis Date   Anemia    Aortic stenosis    Arthritis    CHF (congestive heart failure) (HCC)    Coronary artery disease    Diabetes mellitus without complication (HCC)    type 2   GERD (gastroesophageal reflux disease)    Heart murmur    History of blood transfusion    Hypercholesteremia    Hypertension    Hypothyroidism    Mitral regurgitation    Myocardial infarction (HCC)    Peripheral vascular disease (HCC)    PONV (postoperative nausea and vomiting)    Thyroid disease    Current Outpatient Medications  Medication Sig Dispense Refill   acetaminophen  (TYLENOL ) 500 MG tablet Take 1,000 mg by mouth 2 (two) times daily as needed for moderate pain (pain score 4-6), headache or fever.     Alcohol Swabs  (PHARMACIST CHOICE ALCOHOL) PADS Apply topically.     aspirin  81 MG EC tablet  Take 81 mg by mouth daily.     atorvastatin  (LIPITOR) 40 MG tablet Take 40 mg by mouth daily.     clopidogrel  (PLAVIX ) 75 MG tablet TAKE 1 TABLET BY MOUTH EVERY DAY WITH BREAKFAST 90 tablet 3   ferrous sulfate  325 (65 FE) MG EC tablet Take 1 tablet (325 mg total) by mouth in the morning and at bedtime. 60 tablet 0   gabapentin  (NEURONTIN ) 300 MG capsule Take 300 mg by mouth at bedtime.     insulin  degludec (TRESIBA  FLEXTOUCH) 100 UNIT/ML FlexTouch Pen Inject 6 Units into the skin at bedtime.     levothyroxine  (SYNTHROID ) 150 MCG  tablet Take 150 mcg by mouth daily before breakfast.     midodrine  (PROAMATINE ) 5 MG tablet Take 1 tablet (5 mg total) by mouth 3 (three) times daily with meals. 90 tablet 0   Multiple Vitamin (MULTIVITAMIN WITH MINERALS) TABS tablet Take 1 tablet by mouth daily. 30 tablet 0   Multiple Vitamins-Minerals (CERTA-VITE PO) Take by mouth.     nitroGLYCERIN  (NITROSTAT ) 0.4 MG SL tablet Place 1 tablet (0.4 mg total) under the tongue every 5 (five) minutes x 3 doses as needed for chest pain (if no relief after 3rd dose proceed to ED or call 911). 11/06/2022-New 25 tablet 3   pantoprazole  (PROTONIX ) 40 MG tablet Take 40 mg by mouth daily.     polyethylene glycol (MIRALAX  / GLYCOLAX ) 17 g packet Take 17 g by mouth 2 (two) times daily. (Patient taking differently: Take 17 g by mouth daily as needed for mild constipation or moderate constipation.)     promethazine  (PHENERGAN ) 25 MG tablet Take 1 tablet (25 mg total) by mouth every 6 (six) hours as needed for nausea or vomiting. 20 tablet 0   torsemide  (DEMADEX ) 20 MG tablet Take 4 tablets (80 mg total) by mouth 2 (two) times daily. 240 tablet 0   No current facility-administered medications for this encounter.   Allergies  Allergen Reactions   Trental  [Pentoxifylline ] Nausea And Vomiting   Vibramycin [Doxycycline] Nausea And Vomiting   Social History   Socioeconomic History   Marital status: Divorced    Spouse name: Not on file   Number of children: 0   Years of education: Not on file   Highest education level: Not on file  Occupational History   Not on file  Tobacco Use   Smoking status: Former    Current packs/day: 0.00    Types: Cigarettes    Quit date: 01/2017    Years since quitting: 6.9   Smokeless tobacco: Never  Vaping Use   Vaping status: Never Used  Substance and Sexual Activity   Alcohol use: Not Currently   Drug use: Yes    Types: Marijuana    Comment: on ocassion- Last time - 11/13/22   Sexual activity: Yes    Birth  control/protection: Post-menopausal  Other Topics Concern   Not on file  Social History Narrative   Not on file   Social Drivers of Health   Financial Resource Strain: Low Risk  (10/19/2023)   Received from [facility]   Overall Financial Resource Strain (CARDIA)    How hard is it for you to pay for the very basics like food, housing, medical care, and heating?: Not hard at all  Food Insecurity: No Food Insecurity (12/15/2023)   Hunger Vital Sign    Worried About Running Out of Food in the Last Year: Never true    Ran Out of Food  in the Last Year: Never true  Transportation Needs: No Transportation Needs (12/15/2023)   PRAPARE - Administrator, Civil Service (Medical): No    Lack of Transportation (Non-Medical): No  Physical Activity: Not on file  Stress: Not on file  Social Connections: Unknown (12/15/2023)   Social Connection and Isolation Panel    Frequency of Communication with Friends and Family: Three times a week    Frequency of Social Gatherings with Friends and Family: Three times a week    Attends Religious Services: Not on file    Active Member of Clubs or Organizations: Not on file    Attends Club or Organization Meetings: Not on file    Marital Status: Not on file  Intimate Partner Violence: Not At Risk (12/15/2023)   Humiliation, Afraid, Rape, and Kick questionnaire    Fear of Current or Ex-Partner: No    Emotionally Abused: No    Physically Abused: No    Sexually Abused: No   Family History  Problem Relation Age of Onset   Diabetes Other    CAD Other     Wt Readings from Last 3 Encounters:  01/03/24 86.2 kg (190 lb)  12/24/23 86.2 kg (190 lb)  12/24/23 86.2 kg (190 lb)   BP 104/62   Pulse 80   Ht 5' 4 (1.626 m)   Wt 86.2 kg (190 lb)   SpO2 97%   BMI 32.61 kg/m   PHYSICAL EXAM: General:  NAD. No resp difficulty, arrived in [facility] HEENT: Normal Neck: Supple. No JVD. Cor: Irregular rate & rhythm. No rubs, gallops, 3/6 SEM RUSB, S2 not  audible Lungs: Clear Abdomen: Soft, obese, nontender, nondistended.  Extremities: No cyanosis, clubbing, rash, 1+ RLE edema, s/p L BKA Neuro: Alert & oriented x 3, moves all 4 extremities w/o difficulty. Affect pleasant.  ECG (personally reviewed): SR with frequent PVCs 89 bpm  ReDs reading: 48%, abnormal  ASSESSMENT & PLAN: Chronic Systolic Heart Failure - TEE 6/25: EF 60-65%, RV okay, mild to mod MR, moderate-severe AS with mG 27 mmHg and AVA 1.07 cm2 - Admitted for mixed shock (CGS and septic) - Echo 11/15/23: EF 25% - RHC (7/25) aborted 2/2 flash pulmonary edema  - Repeat echo with EF 25-30%, G2DD, mild to mod MR, mod-severe AS  - RHC (8/25): severely elevated PCWP, mildly elevated RAP, preserved CO.  - NYHA II, functional status limited by BKA. Volume looks OK on exam but ReDs elevated at 48%. GDMT limited by renal function & BP - Continue torsemide  80 mg bid. Check BNP to help guide diuresis. - Continue midodrine  5 mg tid, for BP support  - No SGLT2i with UTI - Labs today - Plan to repeat echo in 3 months.   2. CAD - NSTEMI in 4/24. Multivessel CAD. CABG had been recommended but managed medically due to other active medical issues - No further ischemic eval while inpatient due to AKI, may need to revisit when renal function stabilizes - No chest pain - Continue aspirin  + atorvastatin  40 mg daily - If she is a CABG candidate, ? SAVR at same time?   3. Valvular heart disease - Mild to moderate MR and moderate to severe AS on echo 6/25 - Repeat echo 7/25 with extremely poor windows, based on exam expect AS moderate-severe - Echo 11/20/23 with Vmax 3.21, DVT 0.26, mod to severe AS, mG 22 - AS appears severe on exam. She is about 4 weeks out from infection  - Refer to Structural  Heart Team for TAVR eval   4. MRSA bacteremia - Necrotizing fasciitis - S/p recent left BKA 6/25; S/p R TMA in 4/24   5. PAD - hx SFA stents - Had been on plavix  but stopped d/t risk of  bleeding. - On aspirin  + statin.    6. Anemia - s/p 1 u RBC 11/11/23 - Hx GI bleed from AVMs.  - No bleeding issues - CBC today   7. CKD IIIa - In setting of volume overload/low-output.  - Started on CVVH 11/16/23, stopped 11/19/23.  - Renal function worsened 2/2 bladder outlet obstruction from extensive clots in bladder.   - s/p cystoscopy and clot removal - BMET today  8. PVCs - Frequent on ECG today - She is asymptomatic - Place 2 week Zio to quantify - Labs today - Discuss sleep study next visit   Follow up in 3 weeks with APP and 3 months with Dr. Cherrie + echo.  Harlene Gainer, FNP-BC 01/03/24

## 2024-01-06 ENCOUNTER — Telehealth (HOSPITAL_COMMUNITY): Payer: Self-pay | Admitting: *Deleted

## 2024-01-06 DIAGNOSIS — I5022 Chronic systolic (congestive) heart failure: Secondary | ICD-10-CM

## 2024-01-06 DIAGNOSIS — I959 Hypotension, unspecified: Secondary | ICD-10-CM

## 2024-01-06 MED ORDER — MIDODRINE HCL 5 MG PO TABS: 5.0000 mg | ORAL_TABLET | Freq: Three times a day (TID) | ORAL | 11 refills | Status: DC

## 2024-01-06 MED ORDER — ADULT MULTIVITAMIN W/MINERALS CH: 1.0000 | ORAL_TABLET | Freq: Every day | ORAL | 3 refills | Status: DC

## 2024-01-06 MED ORDER — POTASSIUM CHLORIDE CRYS ER 20 MEQ PO TBCR: 40.0000 meq | EXTENDED_RELEASE_TABLET | Freq: Every day | ORAL | 6 refills | Status: DC

## 2024-01-06 NOTE — Telephone Encounter (Signed)
 Called patient per Harlene Gainer, NP with following lab results and instructions:  K is low, please start 40 KCL daily. Repeat BMET in 7- 10 days  Pt verbalized understanding of same. Rx sent for potassium, will add repeat BMP to her next visit in [facility] on 01/20/24. Pt requested refills be sent on her Seravite and Midodrine  - done.

## 2024-01-06 NOTE — Progress Notes (Addendum)
 Patient ID: Kayla Schmitt MRN: [id number] DOB/AGE: 57-Feb-1967 57 y.o.  Primary Care Physician:Hoffman, Jacquline NOVAK, NP Primary cardiologist:  Court AHF Cardiologist: Bensimhon  CC:  Aortic valvular disease management     FOCUSED PROBLEM LIST:   Aortic stenosis AVA 0.74, MG 22, V-max 3.2, DI 0.29, SVI 20, EF 25 to 30% TTE July 2025 EKG sinus rhythm with PVCs Mitral regurgitation Mild to moderate, moderate MAC, EF 25 to 30% TTE July 2025 CAD ACS 2024 Severe multivessel disease >> CABG deferred >> med Rx ICM EF 25 to 30% TTE July 2025 T2DM On insulin  Hypertension Hyperlipidemia Aortic atherosclerosis CT abdomen pelvis 2024 Peripheral vascular disease  Status post bilateral SFA stents Status post bilateral 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 The patient is a 58 year old female with the above listed medical problems referred for recommendations regarding the patient's low-flow low gradient aortic stenosis.  The patient has a complicated medical history.  Most recently in June 2025 she developed MRSA bacteremia necrotizing fasciitis requiring left BKA with revision.  Echocardiogram at that time demonstrated mild to moderate mitral regurgitation and moderate to severe aortic stenosis with aortic valve indices consistent with low-flow low gradient aortic stenosis.  She was treated with 4 weeks of antibiotics.  Unfortunately she was admitted to [facility] with heart failure in July 2025.  She ultimately required intubation due to flash pulmonary edema.  She was effectively diuresed while on inotropes.  She required transient CRRT.  She underwent right heart catheterization which demonstrated preserved cardiac output of 5.28, PVR of 2.1, but a wedge pressure of 29 mmHg with V waves to 43  mmHg.  Her course was complicated by hematuria and required thrombectomy of her bladder.  She was ultimately extubated and goal-directed medical therapy initiated.  She was seen in heart failure clinic in early September.  She was doing well without dyspnea however she remains wheelchair-bound.  Due to her recent echocardiogram demonstrating low-flow low gradient aortic stenosis she is referred for further recommendations.  She is currently not working; her boss suggested she file for disability due to her health issues, and she is considering this option. She spends her days at home, occasionally attending church and running errands. She drives using her remaining leg.  She experiences occasional shortness of breath, particularly when exerting herself, such as when shopping. She uses oxygen  at night and sleeps with two to three pillows to alleviate discomfort. No significant swelling, monitoring her calf circumference daily.  She is on midodrine , started during her hospital stay, and takes it three times a day. She has not missed doses recently. Her blood pressure was notably low during her hospitalization.  No significant bleeding or chest pain. Her mother assists with her care, including wound management for her leg, which is healing but still draining.  She has upper dentures.  The last time she saw dentist was about a year ago.  She denies any dental issues.         Past Medical History:  Diagnosis Date   Anemia    Aortic stenosis    Arthritis    CHF (congestive heart failure) (HCC)    Coronary artery disease    Diabetes mellitus without complication (HCC)    type 2   GERD (gastroesophageal reflux disease)    Heart murmur  History of blood transfusion    Hypercholesteremia    Hypertension    Hypothyroidism    Mitral regurgitation    Myocardial infarction (HCC)    Peripheral vascular disease (HCC)    PONV (postoperative nausea and vomiting)    Thyroid  disease     Past  Surgical History:  Procedure Laterality Date   ABDOMINAL AORTOGRAM W/LOWER EXTREMITY Bilateral 01/14/2020   Procedure: ABDOMINAL AORTOGRAM W/LOWER EXTREMITY;  Surgeon: Gretta Lonni PARAS, MD;  [facility]: [facility];  Service: Cardiovascular;  Laterality: Bilateral;   ABDOMINAL AORTOGRAM W/LOWER EXTREMITY N/A 07/05/2022   Procedure: ABDOMINAL AORTOGRAM W/LOWER EXTREMITY;  Surgeon: Gretta Lonni PARAS, MD;  Location: [facility];  Service: Cardiovascular;  Laterality: N/A;   ABDOMINAL AORTOGRAM W/LOWER EXTREMITY N/A 08/02/2022   Procedure: ABDOMINAL AORTOGRAM W/LOWER EXTREMITY;  Surgeon: Gretta Lonni PARAS, MD;  Location: [facility];  Service: Cardiovascular;  Laterality: N/A;   ABDOMINAL AORTOGRAM W/LOWER EXTREMITY N/A 08/06/2022   Procedure: ABDOMINAL AORTOGRAM W/LOWER EXTREMITY;  Surgeon: Gretta Lonni PARAS, MD;  [facility]: [facility];  Service: Cardiovascular;  Laterality: N/A;   AMPUTATION Bilateral 07/20/2022   Procedure: AMPUTATION RIGHT GREAT TOE AND SECOND TOE, AMPUTATION LEFT GREAT TOE;  Surgeon: Harden Jerona GAILS, MD;  Location: [facility] OR;  Service: Orthopedics;  Laterality: Bilateral;   AMPUTATION Bilateral 08/08/2022   Procedure: BILATERAL TRANSMETATARSAL AMPUTATION;  Surgeon: Harden Jerona GAILS, MD;  Location: [facility] OR;  Service: Orthopedics;  Laterality: Bilateral;   AMPUTATION Left 10/22/2023   Procedure: AMPUTATION BELOW KNEE;  Surgeon: Lanis Fonda BRAVO, MD;  Location: [facility] OR;  Service: Vascular;  Laterality: Left;   AMPUTATION TOE     BIOPSY  04/06/2023   Procedure: BIOPSY;  Surgeon: Lurena Elspeth SQUIBB, MD;  Location: [facility] ENDOSCOPY;  Service: Gastroenterology;;   COLONOSCOPY WITH PROPOFOL  N/A 04/06/2023   Procedure: COLONOSCOPY WITH PROPOFOL ;  Surgeon: Gaynel Elspeth SQUIBB, MD;  Location: [facility] ENDOSCOPY;  Service: Gastroenterology;  Laterality: N/A;   CYSTOSCOPY WITH STENT PLACEMENT N/A 11/30/2023   Procedure: CYSTOSCOPY, WITH FULGURATION OF BLEEDING AND CLOT EVACUATION;   Surgeon: Roseann Adine PARAS., MD;  Location: [facility] OR;  Service: Urology;  Laterality: N/A;  CLOT EVACUATION OF BLADDER   ESOPHAGOGASTRODUODENOSCOPY (EGD) WITH PROPOFOL  N/A 04/06/2023   Procedure: ESOPHAGOGASTRODUODENOSCOPY (EGD) WITH PROPOFOL ;  Surgeon: Yesennia Elspeth SQUIBB, MD;  Location: [facility] ENDOSCOPY;  Service: Gastroenterology;  Laterality: N/A;   GIVENS CAPSULE STUDY N/A 04/06/2023   Procedure: GIVENS CAPSULE STUDY;  Surgeon: Tannisha Elspeth SQUIBB, MD;  Location: [facility] ENDOSCOPY;  Service: Gastroenterology;  Laterality: N/A;   PERIPHERAL VASCULAR ATHERECTOMY Right 01/14/2020   Procedure: PERIPHERAL VASCULAR ATHERECTOMY;  Surgeon: Gretta Lonni PARAS, MD;  Location: [facility] INVASIVE CV LAB;  Service: Cardiovascular;  Laterality: Right;  sfa   PERIPHERAL VASCULAR INTERVENTION Right 01/14/2020   Procedure: PERIPHERAL VASCULAR INTERVENTION;  Surgeon: Gretta Lonni PARAS, MD;  [facility];  Service: Cardiovascular;  Laterality: Right;  sfa stent    PERIPHERAL VASCULAR INTERVENTION  07/05/2022   Procedure: PERIPHERAL VASCULAR INTERVENTION;  Surgeon: Gretta Lonni PARAS, MD;  [facility];  Service: Cardiovascular;;   PERIPHERAL VASCULAR INTERVENTION  08/02/2022   Procedure: PERIPHERAL VASCULAR INTERVENTION;  Surgeon: Gretta Lonni PARAS, MD;  [facility];  Service: Cardiovascular;;   PERIPHERAL VASCULAR INTERVENTION  08/06/2022   Procedure: PERIPHERAL VASCULAR INTERVENTION;  Surgeon: Gretta Lonni PARAS, MD;  Location: [facility] INVASIVE CV LAB;  Service: Cardiovascular;;   PERIPHERAL VASCULAR THROMBECTOMY  08/02/2022   Procedure:  PERIPHERAL VASCULAR THROMBECTOMY;  Surgeon: Gretta Lonni PARAS, MD;  Location: [facility] INVASIVE CV LAB;  Service: Cardiovascular;;   REVISION AMPUTATION, BELOW THE KNEE Left 10/23/2023   Procedure: REVISION AMPUTATION, BELOW THE KNEE;  Surgeon: Harden Jerona GAILS, MD;  Location: [facility] OR;  Service: Orthopedics;  Laterality: Left;  REVISION LEFT BELOW KNEE  AMPUTATION   RIGHT HEART CATH N/A 11/29/2023   Procedure: RIGHT HEART CATH;  Surgeon: Rolan Ezra RAMAN, MD;  Location: [facility] INVASIVE CV LAB;  Service: Cardiovascular;  Laterality: N/A;   RIGHT/LEFT HEART CATH AND CORONARY ANGIOGRAPHY N/A 08/23/2022   Procedure: RIGHT/LEFT HEART CATH AND CORONARY ANGIOGRAPHY;  Surgeon: Dann Candyce RAMAN, MD;  Location: [facility] INVASIVE CV LAB;  Service: Cardiovascular;  Laterality: N/A;   SKIN SPLIT GRAFT Right 03/18/2020   Procedure: SKIN GRAFTING RIGHT FOOT ULCER;  Surgeon: Harden Jerona GAILS, MD;  Location: [facility] OR;  Service: Orthopedics;  Laterality: Right;   STUMP REVISION Right 11/30/2022   Procedure: REVISION RIGHT TRANSMETATARSAL AMPUTATION;  Surgeon: Harden Jerona GAILS, MD;  Location: [facility] OR;  Service: Orthopedics;  Laterality: Right;   TEE WITHOUT CARDIOVERSION N/A 08/27/2022   Procedure: TRANSESOPHAGEAL ECHOCARDIOGRAM;  Surgeon: Hobart Powell BRAVO, MD;  Location: [facility] INVASIVE CV LAB;  Service: Cardiovascular;  Laterality: N/A;   TRANSESOPHAGEAL ECHOCARDIOGRAM (CATH LAB) N/A 10/24/2023   Procedure: TRANSESOPHAGEAL ECHOCARDIOGRAM;  Surgeon: Michele Richardson, DO;  [facility];  Service: Cardiovascular;  Laterality: N/A;   WISDOM TOOTH EXTRACTION      Family History  Problem Relation Age of Onset   Diabetes Other    CAD Other     Social History   Socioeconomic History   Marital status: Divorced    Spouse name: Not on file   Number of children: 0   Years of education: Not on file   Highest education level: Not on file  Occupational History   Not on file  Tobacco Use   Smoking status: Former    Current packs/day: 0.00    Types: Cigarettes    Quit date: 01/2017    Years since quitting: 6.9   Smokeless tobacco: Never  Vaping Use   Vaping status: Never Used  Substance and Sexual Activity   Alcohol use: Not Currently   Drug use: Yes    Types: Marijuana    Comment: on ocassion- Last time - 11/13/22   Sexual activity: Yes    Birth  control/protection: Post-menopausal  Other Topics Concern   Not on file  Social History Narrative   Not on file   Social Drivers of Health   Financial Resource Strain: Low Risk  (10/19/2023)   [facility] from [facility]   Overall Financial Resource Strain (CARDIA)    How hard is it for you to pay for the very basics like food, housing, medical care, and heating?: Not hard at all  Food Insecurity: No Food Insecurity (12/15/2023)   Hunger Vital Sign    Worried About Running Out of Food in the Last Year: Never true    Ran Out of Food in the Last Year: Never true  Transportation Needs: No Transportation Needs (12/15/2023)   PRAPARE - Administrator, Civil Service (Medical): No    Lack of Transportation (Non-Medical): No  Physical Activity: Not on file  Stress: Not on file  Social Connections: Unknown (12/15/2023)   Social Connection and Isolation Panel    Frequency of Communication with Friends and Family: Three times a week    Frequency of Social Gatherings  with Friends and Family: Three times a week    Attends Religious Services: Not on file    Active Member of Clubs or Organizations: Not on file    Attends Club or Organization Meetings: Not on file    Marital Status: Not on file  Intimate Partner Violence: Not At Risk (12/15/2023)   Humiliation, Afraid, Rape, and Kick questionnaire    Fear of Current or Ex-Partner: No    Emotionally Abused: No    Physically Abused: No    Sexually Abused: No     Prior to Admission medications   Medication Sig Start Date End Date Taking? Authorizing Provider  acetaminophen  (TYLENOL ) 500 MG tablet Take 1,000 mg by mouth 2 (two) times daily as needed for moderate pain (pain score 4-6), headache or fever.    [provider]  Alcohol Swabs  (PHARMACIST CHOICE ALCOHOL) PADS Apply topically. 04/09/23   [provider]  aspirin  EC 81 MG tablet Take 1 tablet (81 mg total) by mouth daily. 01/03/24   Milford, Harlene HERO, FNP   atorvastatin  (LIPITOR) 40 MG tablet Take 40 mg by mouth daily. 12/14/19   [provider]  clopidogrel  (PLAVIX ) 75 MG tablet Take 1 tablet (75 mg total) by mouth daily. 01/03/24   Milford, Harlene HERO, FNP  ferrous sulfate  325 (65 FE) MG EC tablet Take 1 tablet (325 mg total) by mouth in the morning and at bedtime. 12/18/23   Bryn Bernardino NOVAK, MD  gabapentin  (NEURONTIN ) 300 MG capsule Take 300 mg by mouth at bedtime.    [provider]  insulin  degludec (TRESIBA  FLEXTOUCH) 100 UNIT/ML FlexTouch Pen Inject 6 Units into the skin at bedtime. 11/27/23   Arrien, Mauricio Daniel, MD  levothyroxine  (SYNTHROID ) 150 MCG tablet Take 150 mcg by mouth daily before breakfast. 12/02/19   [provider]  midodrine  (PROAMATINE ) 5 MG tablet Take 1 tablet (5 mg total) by mouth 3 (three) times daily with meals. 01/06/24   Glena Harlene HERO, FNP  Multiple Vitamin (MULTIVITAMIN WITH MINERALS) TABS tablet Take 1 tablet by mouth daily. 01/06/24   Milford, Harlene HERO, FNP  Multiple Vitamins-Minerals (CERTA-VITE PO) Take 1 tablet by mouth daily.    [provider]  nitroGLYCERIN  (NITROSTAT ) 0.4 MG SL tablet Place 1 tablet (0.4 mg total) under the tongue every 5 (five) minutes x 3 doses as needed for chest pain (if no relief after 3rd dose proceed to ED or call 911). 11/06/2022-New 12/09/23   Miriam Norris, NP  pantoprazole  (PROTONIX ) 40 MG tablet Take 40 mg by mouth daily. 12/21/19   [provider]  polyethylene glycol (MIRALAX  / GLYCOLAX ) 17 g packet Take 17 g by mouth 2 (two) times daily. Patient taking differently: Take 17 g by mouth daily as needed for mild constipation or moderate constipation. 10/29/23   Hongalgi, Anand D, MD  potassium chloride  SA (KLOR-CON  M) 20 MEQ tablet Take 2 tablets (40 mEq total) by mouth daily. 01/06/24   Milford, Harlene HERO, FNP  promethazine  (PHENERGAN ) 25 MG tablet Take 1 tablet (25 mg total) by mouth every 6 (six) hours as needed for nausea or vomiting. 10/15/23    Francesca Elsie CROME, MD  torsemide  (DEMADEX ) 20 MG tablet Take 4 tablets (80 mg total) by mouth 2 (two) times daily. 01/03/24   Glena Harlene HERO, FNP    Allergies  Allergen Reactions   Trental  [Pentoxifylline ] Nausea And Vomiting   Vibramycin [Doxycycline] Nausea And Vomiting    REVIEW OF SYSTEMS:  General: no fevers/chills/night sweats Eyes:  no blurry vision, diplopia, or amaurosis ENT: no sore throat or hearing loss Resp: no cough, wheezing, or hemoptysis CV: no edema or palpitations GI: no abdominal pain, nausea, vomiting, diarrhea, or constipation GU: no dysuria, frequency, or hematuria Skin: no rash Neuro: no headache, numbness, tingling, or weakness of extremities Musculoskeletal: no joint pain or swelling Heme: no bleeding, DVT, or easy bruising Endo: no polydipsia or polyuria  BP (!) 114/56   Pulse 78   Ht 5' 4 (1.626 m)   SpO2 96%   BMI 32.61 kg/m   PHYSICAL EXAM: GEN:  AO x 3 in no acute distress HEENT: normal Dentition: Normal Neck: JVP normal. +2 carotid upstrokes without bruits. No thyromegaly. Lungs: equal expansion, clear bilaterally CV: Apex is discrete and nondisplaced, RRR with 3 out of 6 crescendo decrescendo murmur Abd: soft, non-tender, non-distended; no bruit; positive bowel sounds Ext: Trace edema right lower extremity Vascular: Left BKA       Skin: warm and dry without rash Neuro: CN II-XII grossly intact; motor and sensory grossly intact    DATA AND STUDIES:  EKG: 2025 sinus rhythm with PVCs  EKG Interpretation Date/Time:    Ventricular Rate:    PR Interval:    QRS Duration:    QT Interval:    QTC Calculation:   R Axis:      Text Interpretation:          CARDIAC STUDIES: Refer to CV Procedures and Imaging Tabs  12/15/2023: ALT 33 01/03/2024: B Natriuretic Peptide 1,131.9; BUN 68; Creatinine, Ser 1.60; Hemoglobin 8.8; Magnesium  2.1; Platelets 255; Potassium 3.2; Sodium 139; TSH 1.592   STS RISK CALCULATOR: Pending  NHYA  CLASS: 2    ASSESSMENT AND PLAN:   1. Nonrheumatic aortic valve stenosis   2. Nonrheumatic mitral valve regurgitation   3. Coronary artery disease involving native coronary artery of native heart without angina pectoris   4. Ischemic cardiomyopathy   5. Type 2 diabetes mellitus with complication, with long-term current use of insulin  (HCC)   6. Hypertension associated with diabetes (HCC)   7. Hyperlipidemia associated with type 2 diabetes mellitus (HCC)   8. Aortic atherosclerosis (HCC)   9. Peripheral arterial disease (HCC)   10. 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 History of GI bleed     Aortic stenosis: The patient has developed symptoms which I can attribute to her aortic stenosis in conjunction with her severe cardiomyopathy.  She is actually quite functional and is waiting for left lower leg prosthesis.  She has plans to return to work.  She is very motivated to get better.  I think she warrants an evaluation for TAVR.  Certainly we have to monitor the resolution of her slowly healing ulcers on her left residual limb.  She has no signs of infection.  This is being cared for by Dr. Harden.   we have the luxury of waiting for more complete wound healing prior to proceeding with a TAVR procedure--she is not having any high risk symptoms.  She is seeing Dr. Doda relatively soon.  I will refer her for coronary angiography, right heart catheterization, CTA, and cardiothoracic surgical evaluation. Mitral regurgitation: Mild to moderate on most recent echocardiogram; continue medical therapy Coronary artery disease: I reviewed the patient's cardiac catheterization films.  She has an occluded left circumflex and high-grade disease of the proximal LAD.  She was turned down for surgical revascularization in 2024 due to recent toe amputations and poor prospects  for rehabilitation after surgical revascularization.  Would continue medical therapy with aspirin  81 mg, atorvastatin  40 mg,  Plavix  75 mg Ischemic cardiomyopathy: GDMT limited due to low blood pressures and need for midodrine .  Continue midodrine  5 mg 3 times daily T2DM: Continue aspirin  81, atorvastatin  40, no room for ARB, consider SGLT2 inhibitor in the future  Hypertension: Now on midodrine ; consider trial of weaning midodrine . Hyperlipidemia: Continue atorvastatin  40 mg Aortic atherosclerosis: Continue aspirin  81 mg, atorvastatin  40 mg Peripheral arterial disease: Continue aspirin  81 mg, atorvastatin  40 mg CKD stage IIIa: See #5 above. History of GI bleed: Seems to be tolerating aspirin  81 and Plavix  75.  No recurrent GI bleeding   I have personally reviewed the patients imaging data as summarized above.  I have reviewed the natural history of aortic stenosis with the patient and family members who are present today. We have discussed the limitations of medical therapy and the poor prognosis associated with symptomatic aortic stenosis. We have also reviewed potential treatment options, including palliative medical therapy, conventional surgical aortic valve replacement, and transcatheter aortic valve replacement. We discussed treatment options in the context of this patient's specific comorbid medical conditions.   All of the patient's questions were answered today. Will make further recommendations based on the results of studies outlined above.   I spent 45 minutes reviewing all clinical data during and prior to this visit including all relevant imaging studies, laboratories, clinical information from other health systems and prior notes from both Cardiology and other specialties, interviewing the patient, conducting a complete physical examination, and coordinating care in order to formulate a comprehensive and personalized evaluation and treatment plan.   Drexler Maland K Saidi Santacroce, MD  01/07/2024 2:15 PM    [facility] [address] [place], [place], [place]  [address] Phone: [phone]; Fax: [phone]

## 2024-01-06 NOTE — H&P (View-Only) (Signed)
 Patient ID: Kayla Schmitt MRN: [id number] DOB/AGE: 57-Feb-1967 57 y.o.  Primary Care Physician:Hoffman, Jacquline NOVAK, NP Primary cardiologist:  Court AHF Cardiologist: Bensimhon  CC:  Aortic valvular disease management     FOCUSED PROBLEM LIST:   Aortic stenosis AVA 0.74, MG 22, V-max 3.2, DI 0.29, SVI 20, EF 25 to 30% TTE July 2025 EKG sinus rhythm with PVCs Mitral regurgitation Mild to moderate, moderate MAC, EF 25 to 30% TTE July 2025 CAD ACS 2024 Severe multivessel disease >> CABG deferred >> med Rx ICM EF 25 to 30% TTE July 2025 T2DM On insulin  Hypertension Hyperlipidemia Aortic atherosclerosis CT abdomen pelvis 2024 Peripheral vascular disease  Status post bilateral SFA stents Status post bilateral 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 The patient is a 58 year old female with the above listed medical problems referred for recommendations regarding the patient's low-flow low gradient aortic stenosis.  The patient has a complicated medical history.  Most recently in June 2025 she developed MRSA bacteremia necrotizing fasciitis requiring left BKA with revision.  Echocardiogram at that time demonstrated mild to moderate mitral regurgitation and moderate to severe aortic stenosis with aortic valve indices consistent with low-flow low gradient aortic stenosis.  She was treated with 4 weeks of antibiotics.  Unfortunately she was admitted to [facility] with heart failure in July 2025.  She ultimately required intubation due to flash pulmonary edema.  She was effectively diuresed while on inotropes.  She required transient CRRT.  She underwent right heart catheterization which demonstrated preserved cardiac output of 5.28, PVR of 2.1, but a wedge pressure of 29 mmHg with V waves to 43  mmHg.  Her course was complicated by hematuria and required thrombectomy of her bladder.  She was ultimately extubated and goal-directed medical therapy initiated.  She was seen in heart failure clinic in early September.  She was doing well without dyspnea however she remains wheelchair-bound.  Due to her recent echocardiogram demonstrating low-flow low gradient aortic stenosis she is referred for further recommendations.  She is currently not working; her boss suggested she file for disability due to her health issues, and she is considering this option. She spends her days at home, occasionally attending church and running errands. She drives using her remaining leg.  She experiences occasional shortness of breath, particularly when exerting herself, such as when shopping. She uses oxygen  at night and sleeps with two to three pillows to alleviate discomfort. No significant swelling, monitoring her calf circumference daily.  She is on midodrine , started during her hospital stay, and takes it three times a day. She has not missed doses recently. Her blood pressure was notably low during her hospitalization.  No significant bleeding or chest pain. Her mother assists with her care, including wound management for her leg, which is healing but still draining.  She has upper dentures.  The last time she saw dentist was about a year ago.  She denies any dental issues.         Past Medical History:  Diagnosis Date   Anemia    Aortic stenosis    Arthritis    CHF (congestive heart failure) (HCC)    Coronary artery disease    Diabetes mellitus without complication (HCC)    type 2   GERD (gastroesophageal reflux disease)    Heart murmur  History of blood transfusion    Hypercholesteremia    Hypertension    Hypothyroidism    Mitral regurgitation    Myocardial infarction (HCC)    Peripheral vascular disease (HCC)    PONV (postoperative nausea and vomiting)    Thyroid  disease     Past  Surgical History:  Procedure Laterality Date   ABDOMINAL AORTOGRAM W/LOWER EXTREMITY Bilateral 01/14/2020   Procedure: ABDOMINAL AORTOGRAM W/LOWER EXTREMITY;  Surgeon: Gretta Lonni PARAS, MD;  [facility];  Service: Cardiovascular;  Laterality: Bilateral;   ABDOMINAL AORTOGRAM W/LOWER EXTREMITY N/A 07/05/2022   Procedure: ABDOMINAL AORTOGRAM W/LOWER EXTREMITY;  Surgeon: Gretta Lonni PARAS, MD;  Location: [facility];  Service: Cardiovascular;  Laterality: N/A;   ABDOMINAL AORTOGRAM W/LOWER EXTREMITY N/A 08/02/2022   Procedure: ABDOMINAL AORTOGRAM W/LOWER EXTREMITY;  Surgeon: Gretta Lonni PARAS, MD;  [facility]: [facility];  Service: Cardiovascular;  Laterality: N/A;   ABDOMINAL AORTOGRAM W/LOWER EXTREMITY N/A 08/06/2022   Procedure: ABDOMINAL AORTOGRAM W/LOWER EXTREMITY;  Surgeon: Gretta Lonni PARAS, MD;  [facility]: [facility];  Service: Cardiovascular;  Laterality: N/A;   AMPUTATION Bilateral 07/20/2022   Procedure: AMPUTATION RIGHT GREAT TOE AND SECOND TOE, AMPUTATION LEFT GREAT TOE;  Surgeon: Harden Jerona GAILS, MD;  Location: [facility] OR;  Service: Orthopedics;  Laterality: Bilateral;   AMPUTATION Bilateral 08/08/2022   Procedure: BILATERAL TRANSMETATARSAL AMPUTATION;  Surgeon: Harden Jerona GAILS, MD;  Location: [facility] OR;  Service: Orthopedics;  Laterality: Bilateral;   AMPUTATION Left 10/22/2023   Procedure: AMPUTATION BELOW KNEE;  Surgeon: Lanis Fonda BRAVO, MD;  Location: [facility] OR;  Service: Vascular;  Laterality: Left;   AMPUTATION TOE     BIOPSY  04/06/2023   Procedure: BIOPSY;  Surgeon: Lurena Elspeth SQUIBB, MD;  Location: [facility] ENDOSCOPY;  Service: Gastroenterology;;   COLONOSCOPY WITH PROPOFOL  N/A 04/06/2023   Procedure: COLONOSCOPY WITH PROPOFOL ;  Surgeon: Gaynel Elspeth SQUIBB, MD;  Location: [facility] ENDOSCOPY;  Service: Gastroenterology;  Laterality: N/A;   CYSTOSCOPY WITH STENT PLACEMENT N/A 11/30/2023   Procedure: CYSTOSCOPY, WITH FULGURATION OF BLEEDING AND CLOT EVACUATION;   Surgeon: Roseann Adine PARAS., MD;  Location: [facility] OR;  Service: Urology;  Laterality: N/A;  CLOT EVACUATION OF BLADDER   ESOPHAGOGASTRODUODENOSCOPY (EGD) WITH PROPOFOL  N/A 04/06/2023   Procedure: ESOPHAGOGASTRODUODENOSCOPY (EGD) WITH PROPOFOL ;  Surgeon: Yesennia Elspeth SQUIBB, MD;  Location: [facility] ENDOSCOPY;  Service: Gastroenterology;  Laterality: N/A;   GIVENS CAPSULE STUDY N/A 04/06/2023   Procedure: GIVENS CAPSULE STUDY;  Surgeon: Tannisha Elspeth SQUIBB, MD;  Location: [facility] ENDOSCOPY;  Service: Gastroenterology;  Laterality: N/A;   PERIPHERAL VASCULAR ATHERECTOMY Right 01/14/2020   Procedure: PERIPHERAL VASCULAR ATHERECTOMY;  Surgeon: Gretta Lonni PARAS, MD;  Location: [facility] INVASIVE CV LAB;  Service: Cardiovascular;  Laterality: Right;  sfa   PERIPHERAL VASCULAR INTERVENTION Right 01/14/2020   Procedure: PERIPHERAL VASCULAR INTERVENTION;  Surgeon: Gretta Lonni PARAS, MD;  [facility];  Service: Cardiovascular;  Laterality: Right;  sfa stent    PERIPHERAL VASCULAR INTERVENTION  07/05/2022   Procedure: PERIPHERAL VASCULAR INTERVENTION;  Surgeon: Gretta Lonni PARAS, MD;  [facility];  Service: Cardiovascular;;   PERIPHERAL VASCULAR INTERVENTION  08/02/2022   Procedure: PERIPHERAL VASCULAR INTERVENTION;  Surgeon: Gretta Lonni PARAS, MD;  [facility];  Service: Cardiovascular;;   PERIPHERAL VASCULAR INTERVENTION  08/06/2022   Procedure: PERIPHERAL VASCULAR INTERVENTION;  Surgeon: Gretta Lonni PARAS, MD;  Location: [facility] INVASIVE CV LAB;  Service: Cardiovascular;;   PERIPHERAL VASCULAR THROMBECTOMY  08/02/2022   Procedure:  PERIPHERAL VASCULAR THROMBECTOMY;  Surgeon: Gretta Lonni PARAS, MD;  Location: [facility] INVASIVE CV LAB;  Service: Cardiovascular;;   REVISION AMPUTATION, BELOW THE KNEE Left 10/23/2023   Procedure: REVISION AMPUTATION, BELOW THE KNEE;  Surgeon: Harden Jerona GAILS, MD;  Location: [facility] OR;  Service: Orthopedics;  Laterality: Left;  REVISION LEFT BELOW KNEE  AMPUTATION   RIGHT HEART CATH N/A 11/29/2023   Procedure: RIGHT HEART CATH;  Surgeon: Rolan Ezra RAMAN, MD;  Location: [facility] INVASIVE CV LAB;  Service: Cardiovascular;  Laterality: N/A;   RIGHT/LEFT HEART CATH AND CORONARY ANGIOGRAPHY N/A 08/23/2022   Procedure: RIGHT/LEFT HEART CATH AND CORONARY ANGIOGRAPHY;  Surgeon: Dann Candyce RAMAN, MD;  Location: [facility] INVASIVE CV LAB;  Service: Cardiovascular;  Laterality: N/A;   SKIN SPLIT GRAFT Right 03/18/2020   Procedure: SKIN GRAFTING RIGHT FOOT ULCER;  Surgeon: Harden Jerona GAILS, MD;  Location: [facility] OR;  Service: Orthopedics;  Laterality: Right;   STUMP REVISION Right 11/30/2022   Procedure: REVISION RIGHT TRANSMETATARSAL AMPUTATION;  Surgeon: Harden Jerona GAILS, MD;  Location: [facility] OR;  Service: Orthopedics;  Laterality: Right;   TEE WITHOUT CARDIOVERSION N/A 08/27/2022   Procedure: TRANSESOPHAGEAL ECHOCARDIOGRAM;  Surgeon: Hobart Powell BRAVO, MD;  Location: [facility] INVASIVE CV LAB;  Service: Cardiovascular;  Laterality: N/A;   TRANSESOPHAGEAL ECHOCARDIOGRAM (CATH LAB) N/A 10/24/2023   Procedure: TRANSESOPHAGEAL ECHOCARDIOGRAM;  Surgeon: Michele Richardson, DO;  [facility];  Service: Cardiovascular;  Laterality: N/A;   WISDOM TOOTH EXTRACTION      Family History  Problem Relation Age of Onset   Diabetes Other    CAD Other     Social History   Socioeconomic History   Marital status: Divorced    Spouse name: Not on file   Number of children: 0   Years of education: Not on file   Highest education level: Not on file  Occupational History   Not on file  Tobacco Use   Smoking status: Former    Current packs/day: 0.00    Types: Cigarettes    Quit date: 01/2017    Years since quitting: 6.9   Smokeless tobacco: Never  Vaping Use   Vaping status: Never Used  Substance and Sexual Activity   Alcohol use: Not Currently   Drug use: Yes    Types: Marijuana    Comment: on ocassion- Last time - 11/13/22   Sexual activity: Yes    Birth  control/protection: Post-menopausal  Other Topics Concern   Not on file  Social History Narrative   Not on file   Social Drivers of Health   Financial Resource Strain: Low Risk  (10/19/2023)   Received from [facility]   Overall Financial Resource Strain (CARDIA)    How hard is it for you to pay for the very basics like food, housing, medical care, and heating?: Not hard at all  Food Insecurity: No Food Insecurity (12/15/2023)   Hunger Vital Sign    Worried About Running Out of Food in the Last Year: Never true    Ran Out of Food in the Last Year: Never true  Transportation Needs: No Transportation Needs (12/15/2023)   PRAPARE - Administrator, Civil Service (Medical): No    Lack of Transportation (Non-Medical): No  Physical Activity: Not on file  Stress: Not on file  Social Connections: Unknown (12/15/2023)   Social Connection and Isolation Panel    Frequency of Communication with Friends and Family: Three times a week    Frequency of Social Gatherings  with Friends and Family: Three times a week    Attends Religious Services: Not on file    Active Member of Clubs or Organizations: Not on file    Attends Club or Organization Meetings: Not on file    Marital Status: Not on file  Intimate Partner Violence: Not At Risk (12/15/2023)   Humiliation, Afraid, Rape, and Kick questionnaire    Fear of Current or Ex-Partner: No    Emotionally Abused: No    Physically Abused: No    Sexually Abused: No     Prior to Admission medications   Medication Sig Start Date End Date Taking? Authorizing Provider  acetaminophen  (TYLENOL ) 500 MG tablet Take 1,000 mg by mouth 2 (two) times daily as needed for moderate pain (pain score 4-6), headache or fever.    [provider]  Alcohol Swabs  (PHARMACIST CHOICE ALCOHOL) PADS Apply topically. 04/09/23   [provider]  aspirin  EC 81 MG tablet Take 1 tablet (81 mg total) by mouth daily. 01/03/24   Milford, Harlene HERO, FNP   atorvastatin  (LIPITOR) 40 MG tablet Take 40 mg by mouth daily. 12/14/19   [provider]  clopidogrel  (PLAVIX ) 75 MG tablet Take 1 tablet (75 mg total) by mouth daily. 01/03/24   Milford, Harlene HERO, FNP  ferrous sulfate  325 (65 FE) MG EC tablet Take 1 tablet (325 mg total) by mouth in the morning and at bedtime. 12/18/23   Bryn Bernardino NOVAK, MD  gabapentin  (NEURONTIN ) 300 MG capsule Take 300 mg by mouth at bedtime.    [provider]  insulin  degludec (TRESIBA  FLEXTOUCH) 100 UNIT/ML FlexTouch Pen Inject 6 Units into the skin at bedtime. 11/27/23   Arrien, Mauricio Daniel, MD  levothyroxine  (SYNTHROID ) 150 MCG tablet Take 150 mcg by mouth daily before breakfast. 12/02/19   [provider]  midodrine  (PROAMATINE ) 5 MG tablet Take 1 tablet (5 mg total) by mouth 3 (three) times daily with meals. 01/06/24   Glena Harlene HERO, FNP  Multiple Vitamin (MULTIVITAMIN WITH MINERALS) TABS tablet Take 1 tablet by mouth daily. 01/06/24   Milford, Harlene HERO, FNP  Multiple Vitamins-Minerals (CERTA-VITE PO) Take 1 tablet by mouth daily.    [provider]  nitroGLYCERIN  (NITROSTAT ) 0.4 MG SL tablet Place 1 tablet (0.4 mg total) under the tongue every 5 (five) minutes x 3 doses as needed for chest pain (if no relief after 3rd dose proceed to ED or call 911). 11/06/2022-New 12/09/23   Miriam Norris, NP  pantoprazole  (PROTONIX ) 40 MG tablet Take 40 mg by mouth daily. 12/21/19   [provider]  polyethylene glycol (MIRALAX  / GLYCOLAX ) 17 g packet Take 17 g by mouth 2 (two) times daily. Patient taking differently: Take 17 g by mouth daily as needed for mild constipation or moderate constipation. 10/29/23   Hongalgi, Anand D, MD  potassium chloride  SA (KLOR-CON  M) 20 MEQ tablet Take 2 tablets (40 mEq total) by mouth daily. 01/06/24   Milford, Harlene HERO, FNP  promethazine  (PHENERGAN ) 25 MG tablet Take 1 tablet (25 mg total) by mouth every 6 (six) hours as needed for nausea or vomiting. 10/15/23    Francesca Elsie CROME, MD  torsemide  (DEMADEX ) 20 MG tablet Take 4 tablets (80 mg total) by mouth 2 (two) times daily. 01/03/24   Glena Harlene HERO, FNP    Allergies  Allergen Reactions   Trental  [Pentoxifylline ] Nausea And Vomiting   Vibramycin [Doxycycline] Nausea And Vomiting    REVIEW OF SYSTEMS:  General: no fevers/chills/night sweats Eyes:  no blurry vision, diplopia, or amaurosis ENT: no sore throat or hearing loss Resp: no cough, wheezing, or hemoptysis CV: no edema or palpitations GI: no abdominal pain, nausea, vomiting, diarrhea, or constipation GU: no dysuria, frequency, or hematuria Skin: no rash Neuro: no headache, numbness, tingling, or weakness of extremities Musculoskeletal: no joint pain or swelling Heme: no bleeding, DVT, or easy bruising Endo: no polydipsia or polyuria  BP (!) 114/56   Pulse 78   Ht 5' 4 (1.626 m)   SpO2 96%   BMI 32.61 kg/m   PHYSICAL EXAM: GEN:  AO x 3 in no acute distress HEENT: normal Dentition: Normal Neck: JVP normal. +2 carotid upstrokes without bruits. No thyromegaly. Lungs: equal expansion, clear bilaterally CV: Apex is discrete and nondisplaced, RRR with 3 out of 6 crescendo decrescendo murmur Abd: soft, non-tender, non-distended; no bruit; positive bowel sounds Ext: Trace edema right lower extremity Vascular: Left BKA       Skin: warm and dry without rash Neuro: CN II-XII grossly intact; motor and sensory grossly intact    DATA AND STUDIES:  EKG: 2025 sinus rhythm with PVCs  EKG Interpretation Date/Time:    Ventricular Rate:    PR Interval:    QRS Duration:    QT Interval:    QTC Calculation:   R Axis:      Text Interpretation:          CARDIAC STUDIES: Refer to CV Procedures and Imaging Tabs  12/15/2023: ALT 33 01/03/2024: B Natriuretic Peptide 1,131.9; BUN 68; Creatinine, Ser 1.60; Hemoglobin 8.8; Magnesium  2.1; Platelets 255; Potassium 3.2; Sodium 139; TSH 1.592   STS RISK CALCULATOR: Pending  NHYA  CLASS: 2    ASSESSMENT AND PLAN:   1. Nonrheumatic aortic valve stenosis   2. Nonrheumatic mitral valve regurgitation   3. Coronary artery disease involving native coronary artery of native heart without angina pectoris   4. Ischemic cardiomyopathy   5. Type 2 diabetes mellitus with complication, with long-term current use of insulin  (HCC)   6. Hypertension associated with diabetes (HCC)   7. Hyperlipidemia associated with type 2 diabetes mellitus (HCC)   8. Aortic atherosclerosis (HCC)   9. Peripheral arterial disease (HCC)   10. 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 History of GI bleed     Aortic stenosis: The patient has developed symptoms which I can attribute to her aortic stenosis in conjunction with her severe cardiomyopathy.  She is actually quite functional and is waiting for left lower leg prosthesis.  She has plans to return to work.  She is very motivated to get better.  I think she warrants an evaluation for TAVR.  Certainly we have to monitor the resolution of her slowly healing ulcers on her left residual limb.  She has no signs of infection.  This is being cared for by Dr. Harden.   we have the luxury of waiting for more complete wound healing prior to proceeding with a TAVR procedure--she is not having any high risk symptoms.  She is seeing Dr. Doda relatively soon.  I will refer her for coronary angiography, right heart catheterization, CTA, and cardiothoracic surgical evaluation. Mitral regurgitation: Mild to moderate on most recent echocardiogram; continue medical therapy Coronary artery disease: I reviewed the patient's cardiac catheterization films.  She has an occluded left circumflex and high-grade disease of the proximal LAD.  She was turned down for surgical revascularization in 2024 due to recent toe amputations and poor prospects  for rehabilitation after surgical revascularization.  Would continue medical therapy with aspirin  81 mg, atorvastatin  40 mg,  Plavix  75 mg Ischemic cardiomyopathy: GDMT limited due to low blood pressures and need for midodrine .  Continue midodrine  5 mg 3 times daily T2DM: Continue aspirin  81, atorvastatin  40, no room for ARB, consider SGLT2 inhibitor in the future  Hypertension: Now on midodrine ; consider trial of weaning midodrine . Hyperlipidemia: Continue atorvastatin  40 mg Aortic atherosclerosis: Continue aspirin  81 mg, atorvastatin  40 mg Peripheral arterial disease: Continue aspirin  81 mg, atorvastatin  40 mg CKD stage IIIa: See #5 above. History of GI bleed: Seems to be tolerating aspirin  81 and Plavix  75.  No recurrent GI bleeding   I have personally reviewed the patients imaging data as summarized above.  I have reviewed the natural history of aortic stenosis with the patient and family members who are present today. We have discussed the limitations of medical therapy and the poor prognosis associated with symptomatic aortic stenosis. We have also reviewed potential treatment options, including palliative medical therapy, conventional surgical aortic valve replacement, and transcatheter aortic valve replacement. We discussed treatment options in the context of this patient's specific comorbid medical conditions.   All of the patient's questions were answered today. Will make further recommendations based on the results of studies outlined above.   I spent 45 minutes reviewing all clinical data during and prior to this visit including all relevant imaging studies, laboratories, clinical information from other health systems and prior notes from both Cardiology and other specialties, interviewing the patient, conducting a complete physical examination, and coordinating care in order to formulate a comprehensive and personalized evaluation and treatment plan.   Drexler Maland K Saidi Santacroce, MD  01/07/2024 2:15 PM    [facility] [address] [place], [place], [place]  [address] Phone: [phone]; Fax: [phone]

## 2024-01-07 ENCOUNTER — Ambulatory Visit: Attending: Internal Medicine | Admitting: Internal Medicine

## 2024-01-07 ENCOUNTER — Encounter: Payer: Self-pay | Admitting: Internal Medicine

## 2024-01-07 VITALS — BP 114/56 | HR 78 | Ht 64.0 in

## 2024-01-07 DIAGNOSIS — I152 Hypertension secondary to endocrine disorders: Secondary | ICD-10-CM

## 2024-01-07 DIAGNOSIS — I340 Nonrheumatic mitral (valve) insufficiency: Secondary | ICD-10-CM

## 2024-01-07 DIAGNOSIS — Z8719 Personal history of other diseases of the digestive system: Secondary | ICD-10-CM

## 2024-01-07 DIAGNOSIS — E1169 Type 2 diabetes mellitus with other specified complication: Secondary | ICD-10-CM

## 2024-01-07 DIAGNOSIS — I2510 Atherosclerotic heart disease of native coronary artery without angina pectoris: Secondary | ICD-10-CM | POA: Diagnosis not present

## 2024-01-07 DIAGNOSIS — N183 Chronic kidney disease, stage 3 unspecified: Secondary | ICD-10-CM

## 2024-01-07 DIAGNOSIS — I350 Nonrheumatic aortic (valve) stenosis: Secondary | ICD-10-CM | POA: Diagnosis not present

## 2024-01-07 DIAGNOSIS — E1122 Type 2 diabetes mellitus with diabetic chronic kidney disease: Secondary | ICD-10-CM

## 2024-01-07 DIAGNOSIS — E118 Type 2 diabetes mellitus with unspecified complications: Secondary | ICD-10-CM

## 2024-01-07 DIAGNOSIS — I739 Peripheral vascular disease, unspecified: Secondary | ICD-10-CM

## 2024-01-07 DIAGNOSIS — I700 Atherosclerosis of aorta: Secondary | ICD-10-CM

## 2024-01-07 DIAGNOSIS — E785 Hyperlipidemia, unspecified: Secondary | ICD-10-CM

## 2024-01-07 DIAGNOSIS — Z794 Long term (current) use of insulin: Secondary | ICD-10-CM

## 2024-01-07 DIAGNOSIS — E1159 Type 2 diabetes mellitus with other circulatory complications: Secondary | ICD-10-CM

## 2024-01-07 DIAGNOSIS — I255 Ischemic cardiomyopathy: Secondary | ICD-10-CM | POA: Diagnosis not present

## 2024-01-07 NOTE — Patient Instructions (Addendum)
 Medication Instructions:  No medication changes were made at this visit. Continue current regimen.   *If you need a refill on your cardiac medications before your next appointment, please call your pharmacy*  Lab Work: None ordered today. If you have labs (blood work) drawn today and your tests are completely normal, you will receive your results only by: MyChart Message (if you have MyChart) OR A paper copy in the mail If you have any lab test that is abnormal or we need to change your treatment, we will call you to review the results.  Testing/Procedures: Your physician has requested that you have a cardiac catheterization. Cardiac catheterization is used to diagnose and/or treat various heart conditions. Doctors may recommend this procedure for a number of different reasons. The most common reason is to evaluate chest pain. Chest pain can be a symptom of coronary artery disease (CAD), and cardiac catheterization can show whether plaque is narrowing or blocking your heart's arteries. This procedure is also used to evaluate the valves, as well as measure the blood flow and oxygen  levels in different parts of your heart. For further information please visit [email or web address] Please follow instruction sheet, as given.   Follow-Up: At [facility], you and your health needs are our priority.  As part of our continuing mission to provide you with exceptional heart care, our providers are all part of one team.  This team includes your primary Cardiologist (physician) and Advanced Practice Providers or APPs (Physician Assistants and Nurse Practitioners) who all work together to provide you with the care you need, when you need it.  Your next appointment:   Per structural heart team  Provider:   Lurena Red, MD    We recommend signing up for the patient portal called MyChart.  Sign up information is provided on this After Visit Summary.  MyChart is used to connect with patients for  Virtual Visits (Telemedicine).  Patients are able to view lab/test results, encounter notes, upcoming appointments, etc.  Non-urgent messages can be sent to your provider as well.   To learn more about what you can do with MyChart, go to [email or web address].   Other Instructions       Cardiac/Peripheral Catheterization   You are scheduled for a Cardiac Catheterization on Friday, September 19 with Dr. Lurena Red.  1. Please arrive at the [facility] (Main Entrance A) at [facility]: [address] [place], [place] [address] at 9:30 AM (This time is 2 hour(s) before your procedure to ensure your preparation).   Free valet parking service is available. You will check in at ADMITTING. The support person will be asked to wait in the waiting room.  It is OK to have someone drop you off and come back when you are ready to be discharged.        Special note: Every effort is made to have your procedure done on time. Please understand that emergencies sometimes delay scheduled procedures.  2. Diet: Nothing to eat after midnight.  3. Hydration:You need to be well hydrated before your procedure. On September 19, you may drink approved liquids (see below) until 2 hours before the procedure, with 16 oz of water  as your last intake.   List of approved liquids water , clear juice, clear tea, black coffee, fruit juices, non-citric and without pulp, carbonated beverages, Gatorade, Kool -Aid, plain Jello-O and plain ice popsicles.  4. Labs: You have already had a BMP and CBC in 9/5 and do not need any further  lab work.  5. Medication instructions in preparation for your procedure:   Contrast Allergy: No   Stop taking, Torsemide  (Demadex ) Thursday, September 18   Take only 3 units of insulin  the night before your procedure. Do not take any insulin  on the day of the procedure.   On the morning of your procedure, take Aspirin  81 mg and Plavix /Clopidogrel  and any morning medicines NOT  listed above.  You may use sips of water .  6. Plan to go home the same day, you will only stay overnight if medically necessary. 7. You MUST have a responsible adult to drive you home. 8. An adult MUST be with you the first 24 hours after you arrive home. 9. Bring a current list of your medications, and the last time and date medication taken. 10. Bring ID and current insurance cards. 11.Please wear clothes that are easy to get on and off and wear slip-on shoes.  Thank you for allowing us  to care for you!   -- [facility]

## 2024-01-08 ENCOUNTER — Encounter: Payer: Self-pay | Admitting: Urology

## 2024-01-08 LAB — UROV MD:CYTOLOGY W/REFLEX FISH ATYPICAL CYTOLOGY

## 2024-01-09 ENCOUNTER — Ambulatory Visit: Payer: Self-pay | Admitting: Urology

## 2024-01-09 ENCOUNTER — Ambulatory Visit (INDEPENDENT_AMBULATORY_CARE_PROVIDER_SITE_OTHER): Admitting: Orthopedic Surgery

## 2024-01-09 DIAGNOSIS — Z89439 Acquired absence of unspecified foot: Secondary | ICD-10-CM

## 2024-01-09 DIAGNOSIS — T8781 Dehiscence of amputation stump: Secondary | ICD-10-CM

## 2024-01-09 DIAGNOSIS — S88112A Complete traumatic amputation at level between knee and ankle, left lower leg, initial encounter: Secondary | ICD-10-CM

## 2024-01-09 DIAGNOSIS — Z89512 Acquired absence of left leg below knee: Secondary | ICD-10-CM

## 2024-01-14 ENCOUNTER — Encounter: Payer: Self-pay | Admitting: Orthopedic Surgery

## 2024-01-14 NOTE — Progress Notes (Signed)
 Office Visit Note   Patient: Kayla Schmitt           Date of Birth: 1966-02-21           MRN: [id number] Visit Date: 01/09/2024              Requested by: [name], NP [address] [place],  [place] [address] PCP: [name], NP  Chief Complaint  Patient presents with   Left Leg - Routine Post Op    10/23/23 left BKA      HPI: Discussed the use of AI scribe software for clinical note transcription with the patient, who gave verbal consent to proceed.  History of Present Illness Kayla Schmitt is a 58 year old female who presents with a healing residual limb wound.  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  She is performing wet-to-dry dressing changes on her wound and is running low on supplies. The wound sometimes 'really drips' but she has feeling in her leg. She inquires about the healing process, wondering if it will heal with a hole or smooth over.  She mentions a black area in the middle of the wound that she attempted to remove but was unable to. She is concerned about making the wound bleed and prefers to leave it to a medical professional.  She has an upcoming appointment at [facility] in three weeks and is concerned about the healing timeline, hoping it would heal in less than two months. She is considering returning to work using her wheelchair and mentions being approved for disability until October 13th.  She also mentions having 'some heart things going on' and a discussion with a heart doctor about replacing a 'bad valve'. The heart doctor wants her leg to heal before proceeding with any heart procedures.  She discusses issues with her prosthetic, noting that the top of her leg has shrunk or changed, causing discomfort. She has not tried a smaller size due to concerns about circulation and describes the current size as 'killing me'.     Assessment & Plan: Visit Diagnoses:  1. Below-knee  amputation of left lower extremity, initial encounter (HCC)   2. History of transmetatarsal amputation of foot (HCC)   3. Dehiscence of amputation stump of right lower extremity (HCC)     Plan: Assessment and Plan Assessment & Plan Chronic lateral and medial residual limb wounds Lateral wound healing with granulation tissue. Medial wound debrided, no cellulitis. Healing expected in months. Discussed concerns about healing and scarring. - Continue wet-to-dry dressing changes. - Provide additional blue dressing material. - Instruct to fold down prosthetic liner to alleviate discomfort from swelling. - Schedule follow-up in four weeks.      Follow-Up Instructions: No follow-ups on file.   Ortho Exam  Patient is alert, oriented, no adenopathy, well-dressed, normal affect, normal respiratory effort. Physical Exam EXTREMITIES: Residual limb healing well. Lateral wound with 100% healthy granulation tissue, measuring 1x2 cm and 2 cm deep. Medial wound with fibrinous tissue, debrided, measuring 2x3 cm and 1 cm deep. No cellulitis.      Imaging: No results found. No images are attached to the encounter.  Labs: Lab Results  Component Value Date   HGBA1C 5.5 10/28/2023   HGBA1C 6.4 (H) 08/02/2022   HGBA1C 11.0 (H) 01/11/2020   ESRSEDRATE 136 (H) 11/11/2023   ESRSEDRATE 113 (H) 11/04/2023   ESRSEDRATE 136 (H) 01/14/2020   CRP 21.8 (H) 11/11/2023   CRP  7.4 (H) 11/04/2023   CRP 11.3 (H) 01/14/2020   LABURIC 7.7 (H) 02/04/2022   REPTSTATUS 11/17/2023 FINAL 11/15/2023   GRAMSTAIN  11/15/2023    ABUNDANT WBC PRESENT,BOTH PMN AND MONONUCLEAR NO ORGANISMS SEEN    CULT  11/15/2023    RARE Normal respiratory flora-no Staph aureus or Pseudomonas seen Performed at [facility], [address], [place], [place] [address]      Lab Results  Component Value Date   ALBUMIN  3.1 (L) 12/15/2023   ALBUMIN  3.2 (L) 11/30/2023   ALBUMIN  2.8 (L) 11/27/2023    Lab Results  Component  Value Date   MG 2.1 01/03/2024   MG 1.9 12/17/2023   MG 2.3 12/02/2023   Lab Results  Component Value Date   VD25OH 50.60 10/31/2023   VD25OH 42.44 10/30/2023    No results found for: PREALBUMIN    Latest Ref Rng & Units 01/03/2024    3:33 PM 12/18/2023    4:02 AM 12/17/2023   12:59 PM  CBC EXTENDED  WBC 4.0 - 10.5 K/uL 7.0  6.0    RBC 3.87 - 5.11 MIL/uL 3.03  2.83    Hemoglobin 12.0 - 15.0 g/dL 8.8  8.4  8.7   HCT 63.9 - 46.0 % 28.0  27.6  27.4   Platelets 150 - 400 K/uL 255  271       There is no height or weight on file to calculate BMI.  Orders:  No orders of the defined types were placed in this encounter.  No orders of the defined types were placed in this encounter.    Procedures: No procedures performed  Clinical Data: No additional findings.  ROS:  All other systems negative, except as noted in the HPI. Review of Systems  Objective: Vital Signs: There were no vitals taken for this visit.  Specialty Comments:  No specialty comments available.  PMFS History: Patient Active Problem List   Diagnosis Date Noted   Acute on chronic systolic (congestive) heart failure (HCC) 12/15/2023   Chronic anemia 12/15/2023   Gross hematuria 11/30/2023   Clot retention of urine 11/30/2023   Advanced care planning/counseling discussion 11/29/2023   Goals of care, counseling/discussion 11/29/2023   Palliative care by specialist 11/29/2023   Chronic kidney disease, stage 3a (HCC) 11/23/2023   Cardiogenic shock (HCC) 11/16/2023   Acute respiratory failure with hypoxia (HCC) 11/13/2023   Anxiety state 11/12/2023   Acute on chronic systolic CHF (congestive heart failure) (HCC) 11/12/2023   Pressure injury of skin 11/11/2023   Unilateral complete BKA, left, sequela (HCC) 10/29/2023   S/P BKA (below knee amputation) unilateral, left (HCC) 10/29/2023   Necrotizing fasciitis (HCC) 10/22/2023   Gangrene of left foot (HCC) 10/21/2023   MRSA bacteremia 10/21/2023    Sepsis (HCC) 10/20/2023   AKI (acute kidney injury) (HCC) 10/20/2023   Diffuse pain in left lower extremity 10/20/2023   History of GI bleed 10/20/2023   Elevated troponin 10/20/2023   Hypokalemia 10/20/2023   History of coronary artery disease 10/20/2023   Prolonged QT interval 10/20/2023   AVM (arteriovenous malformation) of small bowel, acquired 04/07/2023   Antiplatelet or antithrombotic long-term use 04/04/2023   Iron  deficiency anemia 04/04/2023   Acute on chronic diastolic heart failure (HCC) 04/03/2023   Osteomyelitis of foot, right, acute (HCC) 12/12/2022   CAD (coronary artery disease) 11/06/2022   Aortic stenosis 11/06/2022   Mitral regurgitation 08/27/2022   NSTEMI (non-ST elevated myocardial infarction) (HCC) 08/18/2022   Acute clinical systolic heart  failure (HCC) 08/18/2022   Acute blood loss anemia 08/18/2022   Anemia 08/18/2022   Critical limb ischemia of both lower extremities (HCC) 06/19/2022   Type 2 diabetes mellitus with hyperlipidemia (HCC) 01/28/2020   Hyperlipidemia 01/28/2020   Hypertension 01/28/2020   Hypothyroidism 01/28/2020   Obesity, class 1 01/28/2020   Ulcerated, foot, right, limited to breakdown of skin (HCC)    Cellulitis of right foot    PVD (peripheral vascular disease) (HCC)    Cellulitis 01/11/2020   Blister of leg 01/06/2020   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/28/2017   Chest pain 10/28/2017   GERD (gastroesophageal reflux disease) 10/28/2017   Past Medical History:  Diagnosis Date   Anemia    Aortic stenosis    Arthritis    CHF (congestive heart failure) (HCC)    Coronary artery disease    Diabetes mellitus without complication (HCC)    type 2   GERD (gastroesophageal reflux disease)    Heart murmur    History of blood transfusion    Hypercholesteremia    Hypertension    Hypothyroidism    Mitral regurgitation    Myocardial infarction (HCC)    Peripheral vascular disease (HCC)    PONV (postoperative nausea and  vomiting)    Thyroid  disease     Family History  Problem Relation Age of Onset   Diabetes Other    CAD Other     Past Surgical History:  Procedure Laterality Date   ABDOMINAL AORTOGRAM W/LOWER EXTREMITY Bilateral 01/14/2020   Procedure: ABDOMINAL AORTOGRAM W/LOWER EXTREMITY;  Surgeon: Gretta Lonni PARAS, MD;  Location: [facility];  Service: Cardiovascular;  Laterality: Bilateral;   ABDOMINAL AORTOGRAM W/LOWER EXTREMITY N/A 07/05/2022   Procedure: ABDOMINAL AORTOGRAM W/LOWER EXTREMITY;  Surgeon: Gretta Lonni PARAS, MD;  Location: [facility];  Service: Cardiovascular;  Laterality: N/A;   ABDOMINAL AORTOGRAM W/LOWER EXTREMITY N/A 08/02/2022   Procedure: ABDOMINAL AORTOGRAM W/LOWER EXTREMITY;  Surgeon: Gretta Lonni PARAS, MD;  Location: [facility];  Service: Cardiovascular;  Laterality: N/A;   ABDOMINAL AORTOGRAM W/LOWER EXTREMITY N/A 08/06/2022   Procedure: ABDOMINAL AORTOGRAM W/LOWER EXTREMITY;  Surgeon: Gretta Lonni PARAS, MD;  Location: [facility];  Service: Cardiovascular;  Laterality: N/A;   AMPUTATION Bilateral 07/20/2022   Procedure: AMPUTATION RIGHT GREAT TOE AND SECOND TOE, AMPUTATION LEFT GREAT TOE;  Surgeon: Harden Jerona GAILS, MD;  Location: [facility] OR;  Service: Orthopedics;  Laterality: Bilateral;   AMPUTATION Bilateral 08/08/2022   Procedure: BILATERAL TRANSMETATARSAL AMPUTATION;  Surgeon: Harden Jerona GAILS, MD;  Location: [facility] OR;  Service: Orthopedics;  Laterality: Bilateral;   AMPUTATION Left 10/22/2023   Procedure: AMPUTATION BELOW KNEE;  Surgeon: Lanis Fonda BRAVO, MD;  Location: [facility] OR;  Service: Vascular;  Laterality: Left;   AMPUTATION TOE     BIOPSY  04/06/2023   Procedure: BIOPSY;  Surgeon: Tailey Elspeth SQUIBB, MD;  Location: [facility]COPY;  Service: Gastroenterology;;   COLONOSCOPY WITH PROPOFOL  N/A 04/06/2023   Procedure: COLONOSCOPY WITH PROPOFOL ;  Surgeon: Ella Elspeth SQUIBB, MD;  Location[facility] ENDOSCOPY;  Service: Gastroenterology;  Laterality: N/A;    CYSTOSCOPY WITH STENT PLACEMENT N/A 11/30/2023   Procedure: CYSTOSCOPY, WITH FULGURATION OF BLEEDING AND CLOT EVACUATION;  Surgeon: Roseann Adine PARAS., MD;  Location: [facility] OR;  Service: Urology;  Laterality: N/A;  CLOT EVACUATION OF BLADDER   ESOPHAGOGASTRODUODENOSCOPY (EGD) WITH PROPOFOL  N/A 04/06/2023   Procedure: ESOPHAGOGASTRODUODENOSCOPY (EGD) WITH PROPOFOL ;  Surgeon: Donnamaria Elspeth SQUIBB, MD;  Location: [facility] ENDOSCOPY;  Service: Gastroenterology;  Laterality: N/A;   GIVENS CAPSULE STUDY N/A 04/06/2023   Procedure: GIVENS CAPSULE STUDY;  Surgeon: Donne Elspeth SQUIBB, MD;  Location: [facility] ENDOSCOPY;  Service: Gastroenterology;  Laterality: N/A;   PERIPHERAL VASCULAR ATHERECTOMY Right 01/14/2020   Procedure: PERIPHERAL VASCULAR ATHERECTOMY;  Surgeon: Gretta Lonni PARAS, MD;  Location: [facility] INVASIVE CV LAB;  Service: Cardiovascular;  Laterality: Right;  sfa   PERIPHERAL VASCULAR INTERVENTION Right 01/14/2020   Procedure: PERIPHERAL VASCULAR INTERVENTION;  Surgeon: Gretta Lonni PARAS, MD;  [facility]: [facility];  Service: Cardiovascular;  Laterality: Right;  sfa stent    PERIPHERAL VASCULAR INTERVENTION  07/05/2022   Procedure: PERIPHERAL VASCULAR INTERVENTION;  Surgeon: Gretta Lonni PARAS, MD;  Location: [facility];  Service: Cardiovascular;;   PERIPHERAL VASCULAR INTERVENTION  08/02/2022   Procedure: PERIPHERAL VASCULAR INTERVENTION;  Surgeon: Gretta Lonni PARAS, MD;  [facility]: [facility];  Service: Cardiovascular;;   PERIPHERAL VASCULAR INTERVENTION  08/06/2022   Procedure: PERIPHERAL VASCULAR INTERVENTION;  Surgeon: Gretta Lonni PARAS, MD;  Location: [facility] INVASIVE CV LAB;  Service: Cardiovascular;;   PERIPHERAL VASCULAR THROMBECTOMY  08/02/2022   Procedure: PERIPHERAL VASCULAR THROMBECTOMY;  Surgeon: Gretta Lonni PARAS, MD;  Location: [facility];  Service: Cardiovascular;;   REVISION AMPUTATION, BELOW THE KNEE Left 10/23/2023   Procedure: REVISION AMPUTATION, BELOW THE  KNEE;  Surgeon: Harden Jerona GAILS, MD;  Location: [facility] OR;  Service: Orthopedics;  Laterality: Left;  REVISION LEFT BELOW KNEE AMPUTATION   RIGHT HEART CATH N/A 11/29/2023   Procedure: RIGHT HEART CATH;  Surgeon: Rolan Ezra RAMAN, MD;  Location: [facility] INVASIVE CV LAB;  Service: Cardiovascular;  Laterality: N/A;   RIGHT/LEFT HEART CATH AND CORONARY ANGIOGRAPHY N/A 08/23/2022   Procedure: RIGHT/LEFT HEART CATH AND CORONARY ANGIOGRAPHY;  Surgeon: Dann Candyce RAMAN, MD;  Location: [facility] INVASIVE CV LAB;  Service: Cardiovascular;  Laterality: N/A;   SKIN SPLIT GRAFT Right 03/18/2020   Procedure: SKIN GRAFTING RIGHT FOOT ULCER;  Surgeon: Harden Jerona GAILS, MD;  Location[facility] OR;  Service: Orthopedics;  Laterality: Right;   STUMP REVISION Right 11/30/2022   Procedure: REVISION RIGHT TRANSMETATARSAL AMPUTATION;  Surgeon: Harden Jerona GAILS, MD;  Location: [facility] OR;  Service: Orthopedics;  Laterality: Right;   TEE WITHOUT CARDIOVERSION N/A 08/27/2022   Procedure: TRANSESOPHAGEAL ECHOCARDIOGRAM;  Surgeon: Hobart Powell BRAVO, MD;  Location: [facility] INVASIVE CV LAB;  Service: Cardiovascular;  Laterality: N/A;   TRANSESOPHAGEAL ECHOCARDIOGRAM (CATH LAB) N/A 10/24/2023   Procedure: TRANSESOPHAGEAL ECHOCARDIOGRAM;  Surgeon: Michele Richardson, DO;  Location: [facility];  Service: Cardiovascular;  Laterality: N/A;   WISDOM TOOTH EXTRACTION     Social History   Occupational History   Not on file  Tobacco Use   Smoking status: Former    Current packs/day: 0.00    Types: Cigarettes    Quit date: 01/2017    Years since quitting: 6.9   Smokeless tobacco: Never  Vaping Use   Vaping status: Never Used  Substance and Sexual Activity   Alcohol use: Not Currently   Drug use: Yes    Types: Marijuana    Comment: on ocassion- Last time - 11/13/22   Sexual activity: Yes    Birth control/protection: Post-menopausal

## 2024-01-16 ENCOUNTER — Telehealth: Payer: Self-pay | Admitting: *Deleted

## 2024-01-16 NOTE — Telephone Encounter (Signed)
 Cardiac Catheterization scheduled at [facility] for: Friday January 17, 2024 11:30 AM Arrival time [facility] Main Entrance A at: 6:30 AM of procedure hydration  Diet: -Nothing to eat after midnight.  Hydration: -May drink clear liquids until 2 hours before the procedure.  Approved liquids: Water , clear tea, black coffee, fruit juices-non-citric and without pulp,Gatorade, plain Jello/popsicles.  No PO hydration (bottle of water ) 2 hours prior-going in early for IVF  Medication instructions: -Hold:  Insulin -1/2 usual dose PM prior to procedure Torsemide -PM prior/AM of procedure-per protocol GFR <60 (37) -Other usual morning medications can be taken including aspirin  81 mg and Plavix  75 mg  Plan to go home the same day, you will only stay overnight if medically necessary.  You must have responsible adult to drive you home.  Someone must be with you the first 24 hours after you arrive home.  Reviewed procedure instructions/pre-procedure hydration with patient.

## 2024-01-16 NOTE — Telephone Encounter (Signed)
 Per Dr Secundino flow rate for hydration 100cc/hr no bolus.

## 2024-01-17 ENCOUNTER — Ambulatory Visit: Admitting: Physician Assistant

## 2024-01-17 ENCOUNTER — Encounter (HOSPITAL_COMMUNITY)
Admission: RE | Disposition: A | Discharge status: Home / Self Care | Payer: Self-pay | Source: Home / Self Care | Attending: Internal Medicine

## 2024-01-17 ENCOUNTER — Telehealth (HOSPITAL_COMMUNITY): Payer: Self-pay

## 2024-01-17 ENCOUNTER — Other Ambulatory Visit: Payer: Self-pay

## 2024-01-17 ENCOUNTER — Ambulatory Visit (HOSPITAL_BASED_OUTPATIENT_CLINIC_OR_DEPARTMENT_OTHER)
Admission: RE | Admit: 2024-01-17 | Discharge: 2024-01-17 | Disposition: A | Discharge status: Home / Self Care | Source: Home / Self Care | Attending: Internal Medicine | Admitting: Internal Medicine

## 2024-01-17 ENCOUNTER — Ambulatory Visit (INDEPENDENT_AMBULATORY_CARE_PROVIDER_SITE_OTHER): Admitting: Physician Assistant

## 2024-01-17 ENCOUNTER — Encounter: Payer: Self-pay | Admitting: Physician Assistant

## 2024-01-17 DIAGNOSIS — I2584 Coronary atherosclerosis due to calcified coronary lesion: Secondary | ICD-10-CM | POA: Insufficient documentation

## 2024-01-17 DIAGNOSIS — I252 Old myocardial infarction: Secondary | ICD-10-CM | POA: Insufficient documentation

## 2024-01-17 DIAGNOSIS — I130 Hypertensive heart and chronic kidney disease with heart failure and stage 1 through stage 4 chronic kidney disease, or unspecified chronic kidney disease: Secondary | ICD-10-CM | POA: Insufficient documentation

## 2024-01-17 DIAGNOSIS — Z7902 Long term (current) use of antithrombotics/antiplatelets: Secondary | ICD-10-CM | POA: Insufficient documentation

## 2024-01-17 DIAGNOSIS — I2510 Atherosclerotic heart disease of native coronary artery without angina pectoris: Secondary | ICD-10-CM | POA: Insufficient documentation

## 2024-01-17 DIAGNOSIS — I2582 Chronic total occlusion of coronary artery: Secondary | ICD-10-CM | POA: Insufficient documentation

## 2024-01-17 DIAGNOSIS — T8781 Dehiscence of amputation stump: Secondary | ICD-10-CM | POA: Diagnosis not present

## 2024-01-17 DIAGNOSIS — Z794 Long term (current) use of insulin: Secondary | ICD-10-CM | POA: Insufficient documentation

## 2024-01-17 DIAGNOSIS — Z7982 Long term (current) use of aspirin: Secondary | ICD-10-CM | POA: Insufficient documentation

## 2024-01-17 DIAGNOSIS — E1122 Type 2 diabetes mellitus with diabetic chronic kidney disease: Secondary | ICD-10-CM | POA: Insufficient documentation

## 2024-01-17 DIAGNOSIS — Z79890 Hormone replacement therapy (postmenopausal): Secondary | ICD-10-CM | POA: Insufficient documentation

## 2024-01-17 DIAGNOSIS — I110 Hypertensive heart disease with heart failure: Secondary | ICD-10-CM | POA: Diagnosis not present

## 2024-01-17 DIAGNOSIS — Z87891 Personal history of nicotine dependence: Secondary | ICD-10-CM | POA: Insufficient documentation

## 2024-01-17 DIAGNOSIS — I255 Ischemic cardiomyopathy: Secondary | ICD-10-CM | POA: Insufficient documentation

## 2024-01-17 DIAGNOSIS — I340 Nonrheumatic mitral (valve) insufficiency: Secondary | ICD-10-CM | POA: Insufficient documentation

## 2024-01-17 DIAGNOSIS — I350 Nonrheumatic aortic (valve) stenosis: Secondary | ICD-10-CM | POA: Insufficient documentation

## 2024-01-17 DIAGNOSIS — I509 Heart failure, unspecified: Secondary | ICD-10-CM | POA: Insufficient documentation

## 2024-01-17 DIAGNOSIS — N1831 Chronic kidney disease, stage 3a: Secondary | ICD-10-CM | POA: Insufficient documentation

## 2024-01-17 DIAGNOSIS — Z79899 Other long term (current) drug therapy: Secondary | ICD-10-CM | POA: Insufficient documentation

## 2024-01-17 DIAGNOSIS — E1151 Type 2 diabetes mellitus with diabetic peripheral angiopathy without gangrene: Secondary | ICD-10-CM | POA: Insufficient documentation

## 2024-01-17 DIAGNOSIS — I080 Rheumatic disorders of both mitral and aortic valves: Secondary | ICD-10-CM | POA: Diagnosis not present

## 2024-01-17 DIAGNOSIS — Z993 Dependence on wheelchair: Secondary | ICD-10-CM | POA: Insufficient documentation

## 2024-01-17 HISTORY — PX: RIGHT HEART CATH AND CORONARY ANGIOGRAPHY: CATH118264

## 2024-01-17 LAB — POCT I-STAT 7, (LYTES, BLD GAS, ICA,H+H)
Acid-base deficit: 1 mmol/L (ref 0.0–2.0)
Bicarbonate: 24.1 mmol/L (ref 20.0–28.0)
Calcium, Ion: 1.25 mmol/L (ref 1.15–1.40)
HCT: 25 % — ABNORMAL LOW (ref 36.0–46.0)
Hemoglobin: 8.5 g/dL — ABNORMAL LOW (ref 12.0–15.0)
O2 Saturation: 85 %
Potassium: 3.9 mmol/L (ref 3.5–5.1)
Sodium: 141 mmol/L (ref 135–145)
TCO2: 25 mmol/L (ref 22–32)
pCO2 arterial: 38.6 mmHg (ref 32–48)
pH, Arterial: 7.403 (ref 7.35–7.45)
pO2, Arterial: 49 mmHg — ABNORMAL LOW (ref 83–108)

## 2024-01-17 LAB — POCT I-STAT EG7
Acid-base deficit: 1 mmol/L (ref 0.0–2.0)
Acid-base deficit: 1 mmol/L (ref 0.0–2.0)
Bicarbonate: 23.9 mmol/L (ref 20.0–28.0)
Bicarbonate: 24 mmol/L (ref 20.0–28.0)
Calcium, Ion: 1.09 mmol/L — ABNORMAL LOW (ref 1.15–1.40)
Calcium, Ion: 1.17 mmol/L (ref 1.15–1.40)
HCT: 22 % — ABNORMAL LOW (ref 36.0–46.0)
HCT: 22 % — ABNORMAL LOW (ref 36.0–46.0)
Hemoglobin: 7.5 g/dL — ABNORMAL LOW (ref 12.0–15.0)
Hemoglobin: 7.5 g/dL — ABNORMAL LOW (ref 12.0–15.0)
O2 Saturation: 54 %
O2 Saturation: 45 %
Potassium: 3.6 mmol/L (ref 3.5–5.1)
Potassium: 3.7 mmol/L (ref 3.5–5.1)
Sodium: 143 mmol/L (ref 135–145)
Sodium: 143 mmol/L (ref 135–145)
TCO2: 25 mmol/L (ref 22–32)
TCO2: 25 mmol/L (ref 22–32)
pCO2, Ven: 40.1 mmHg — ABNORMAL LOW (ref 44–60)
pCO2, Ven: 41.1 mmHg — ABNORMAL LOW (ref 44–60)
pH, Ven: 7.383 (ref 7.25–7.43)
pH, Ven: 7.375 (ref 7.25–7.43)
pO2, Ven: 29 mmHg — CL (ref 32–45)
pO2, Ven: 26 mmHg — CL (ref 32–45)

## 2024-01-17 LAB — BASIC METABOLIC PANEL WITH GFR
Anion gap: 11 (ref 5–15)
BUN: 23 mg/dL — ABNORMAL HIGH (ref 6–20)
CO2: 15 mmol/L — ABNORMAL LOW (ref 22–32)
Calcium: 9.1 mg/dL (ref 8.9–10.3)
Chloride: 111 mmol/L (ref 98–111)
Creatinine, Ser: 1.97 mg/dL — ABNORMAL HIGH (ref 0.44–1.00)
GFR, Estimated: 29 mL/min — ABNORMAL LOW (ref >60)
Glucose, Bld: 136 mg/dL — ABNORMAL HIGH (ref 70–99)
Potassium: 4.5 mmol/L (ref 3.5–5.1)
Sodium: 137 mmol/L (ref 135–145)

## 2024-01-17 LAB — GLUCOSE, CAPILLARY: Glucose-Capillary: 161 mg/dL — ABNORMAL HIGH (ref 70–99)

## 2024-01-17 SURGERY — RIGHT HEART CATH AND CORONARY ANGIOGRAPHY
Anesthesia: LOCAL

## 2024-01-17 MED ORDER — HEPARIN SODIUM (PORCINE) 1000 UNIT/ML IJ SOLN
INTRAMUSCULAR | Status: AC
  Filled 2024-01-17: qty 10

## 2024-01-17 MED ORDER — LIDOCAINE HCL (PF) 1 % IJ SOLN
INTRAMUSCULAR | Status: DC | PRN
  Administered 2024-01-17: 2 mL via INTRADERMAL
  Administered 2024-01-17: 5 mL via INTRADERMAL
  Administered 2024-01-17: 2 mL via INTRADERMAL

## 2024-01-17 MED ORDER — FENTANYL CITRATE (PF) 100 MCG/2ML IJ SOLN
INTRAMUSCULAR | Status: AC
  Filled 2024-01-17: qty 2

## 2024-01-17 MED ORDER — LABETALOL HCL 5 MG/ML IV SOLN: 10.0000 mg | INTRAVENOUS | Status: DC | PRN

## 2024-01-17 MED ORDER — LIDOCAINE HCL (PF) 1 % IJ SOLN
INTRAMUSCULAR | Status: AC
  Filled 2024-01-17: qty 30

## 2024-01-17 MED ORDER — SODIUM CHLORIDE 0.9% FLUSH: 3.0000 mL | Freq: Two times a day (BID) | INTRAVENOUS | Status: DC

## 2024-01-17 MED ORDER — SODIUM CHLORIDE 0.9 % IV SOLN: 250.0000 mL | INTRAVENOUS | Status: DC | PRN

## 2024-01-17 MED ORDER — ASPIRIN 81 MG PO CHEW
81.0000 mg | CHEWABLE_TABLET | ORAL | Status: AC
  Administered 2024-01-17: 81 mg via ORAL
  Filled 2024-01-17: qty 1

## 2024-01-17 MED ORDER — IOHEXOL 350 MG/ML SOLN
INTRAVENOUS | Status: DC | PRN
  Administered 2024-01-17: 60 mL

## 2024-01-17 MED ORDER — VERAPAMIL HCL 2.5 MG/ML IV SOLN
INTRAVENOUS | Status: AC
Start: 2024-01-17 — End: 2024-01-17
  Filled 2024-01-17: qty 2

## 2024-01-17 MED ORDER — ACETAMINOPHEN 325 MG PO TABS: 650.0000 mg | ORAL_TABLET | ORAL | Status: DC | PRN

## 2024-01-17 MED ORDER — HEPARIN (PORCINE) IN NACL 1000-0.9 UT/500ML-% IV SOLN
INTRAVENOUS | Status: DC | PRN
  Administered 2024-01-17 (×2): 500 mL

## 2024-01-17 MED ORDER — CLOPIDOGREL BISULFATE 75 MG PO TABS: 75.0000 mg | ORAL_TABLET | ORAL | Status: DC

## 2024-01-17 MED ORDER — MIDAZOLAM HCL 2 MG/2ML IJ SOLN
INTRAMUSCULAR | Status: AC
  Filled 2024-01-17: qty 2

## 2024-01-17 MED ORDER — SODIUM CHLORIDE 0.9 % IV SOLN: INTRAVENOUS | Status: DC

## 2024-01-17 MED ORDER — VERAPAMIL HCL 2.5 MG/ML IV SOLN
INTRAVENOUS | Status: DC | PRN
  Administered 2024-01-17: 10 mL via INTRA_ARTERIAL

## 2024-01-17 MED ORDER — MIDAZOLAM HCL 2 MG/2ML IJ SOLN
INTRAMUSCULAR | Status: DC | PRN
  Administered 2024-01-17: 1 mg via INTRAVENOUS

## 2024-01-17 MED ORDER — HEPARIN SODIUM (PORCINE) 1000 UNIT/ML IJ SOLN
INTRAMUSCULAR | Status: DC | PRN
  Administered 2024-01-17: 5000 [IU] via INTRA_ARTERIAL

## 2024-01-17 MED ORDER — HYDRALAZINE HCL 20 MG/ML IJ SOLN: 10.0000 mg | INTRAMUSCULAR | Status: DC | PRN

## 2024-01-17 MED ORDER — FENTANYL CITRATE (PF) 100 MCG/2ML IJ SOLN
INTRAMUSCULAR | Status: DC | PRN
  Administered 2024-01-17: 25 ug via INTRAVENOUS

## 2024-01-17 MED ORDER — SODIUM CHLORIDE 0.9% FLUSH: 3.0000 mL | INTRAVENOUS | Status: DC | PRN

## 2024-01-17 SURGICAL SUPPLY — 11 items
CATH BALLN WEDGE 5F 110CM (CATHETERS) IMPLANT
CATH INFINITI 5FR ANG PIGTAIL (CATHETERS) IMPLANT
CATH INFINITI AMBI 6FR TG (CATHETERS) IMPLANT
DEVICE RAD COMP TR BAND LRG (VASCULAR PRODUCTS) IMPLANT
GLIDESHEATH SLEND SS 6F .021 (SHEATH) IMPLANT
PACK CARDIAC CATHETERIZATION (CUSTOM PROCEDURE TRAY) ×2 IMPLANT
SET ATX-X65L (MISCELLANEOUS) IMPLANT
SHEATH GLIDE SLENDER 4/5FR (SHEATH) IMPLANT
SHEATH PROBE COVER 6X72 (BAG) IMPLANT
WIRE EMERALD 3MM-J .035X260CM (WIRE) IMPLANT
WIRE MICRO SET SILHO 5FR 7 (SHEATH) IMPLANT

## 2024-01-17 NOTE — Discharge Instructions (Signed)

## 2024-01-17 NOTE — Interval H&P Note (Signed)
 History and Physical Interval Note:  01/17/2024 9:35 AM  Kayla Schmitt  has presented today for surgery, with the diagnosis of aortic stenosis - TAVR workup.  The various methods of treatment have been discussed with the patient and family. After consideration of risks, benefits and other options for treatment, the patient has consented to  Procedure(s): RIGHT/LEFT HEART CATH AND CORONARY ANGIOGRAPHY (N/A) as a surgical intervention.  The patient's history has been reviewed, patient examined, no change in status, stable for surgery.  I have reviewed the patient's chart and labs.  Questions were answered to the patient's satisfaction.     Hurley Blevins K Kamaljit Hizer

## 2024-01-17 NOTE — Progress Notes (Signed)
 Office Visit Note   Patient: Kayla Schmitt           Date of Birth: 03/28/1966           MRN: [id number] Visit Date: 01/17/2024              Requested by: [name], NP [address] [place],  [place] [address] PCP: [name], NP  Chief Complaint  Patient presents with   Left Leg - Routine Post Op    10/23/2023 left BKA       HPI: Kayla Schmitt is a 58 year old female who presents with a healing residual limb wound.  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 She is performing wet-to-dry Vashe dressing changes on her left stump wounds.  She came In today after she had a cardiac cath at [facility] hospital because they were worried about pain over the anterior stump just below the tibial tuberosity. No erythema or edema noted.   She denies fever and chills.  Assessment & Plan: Visit Diagnoses:  1. Dehiscence of amputation stump of right lower extremity (HCC)     Plan: Left BKA stump continue Vashe wet to dry  dressing changes.  Shower with soap and water .  Keep an eye on the anterior shin for erythema or edema.  No need for antibiotics today based on the exam.  Follow-Up Instructions: Return in about 3 weeks (around 02/07/2024) for wound check.   Ortho Exam  Patient is alert, oriented, no adenopathy, well-dressed, normal affect, normal respiratory effort. The wound bed was debrided with 4 x 4 and Vashe.  The lateral wound fibrinous mixed with granulation base 90% measure 1x2 cm and 2 cm deep. Medial wound with fibrinous tissue, debrided, measuring 2x3 cm and 1 cm deep. No cellulitis.   The anterior stump below the tibial tuberosity slightly tender to palpation, no erythema or edema.  No fluctuance.  It is a bony prominence.      Imaging: CARDIAC CATHETERIZATION Result Date: 01/17/2024   Mid LM to Dist LM lesion is 40% stenosed.   Ost LAD to Prox LAD lesion is 75% stenosed.   Mid LAD lesion is 70% stenosed.   Ost  Cx to Prox Cx lesion is 100% stenosed.   Mid RCA lesion is 70% stenosed.   1st Diag lesion is 25% stenosed. 1.  Multivessel disease relatively unchanged versus prior study with occluded left circumflex, high-grade mid LAD, and moderate right coronary artery disease.  Given the lack of exertional angina, medical therapy will be pursued. 2.  Fick cardiac output of 6.7 L/min and Fick cardiac index of 3.6 L/min/m with the following hemodynamics:  Right atrial pressure mean of 8 mmHg  RV 58/6 with an end-diastolic pressure of 12 mmHg  PA 53/29 with a mean of 40 mmHg  Wedge pressure mean of 29 mmHg with V waves to 44 mmHg  PVR of 1.6 Woods units  PA pulsatility index of 3 3.  Left femoral access, which is the side of the patient's BKA, looks to be approachable if TAVR is to be pursued. Recommendation: Judicious IV fluids given CKD, continue evaluation for aortic valve intervention.  Discharge later today.      Labs: Lab Results  Component Value Date   HGBA1C 5.5 10/28/2023   HGBA1C 6.4 (H) 08/02/2022   HGBA1C 11.0 (H) 01/11/2020   ESRSEDRATE 136 (H) 11/11/2023   ESRSEDRATE 113 (H) 11/04/2023   ESRSEDRATE 136 (  H) 01/14/2020   CRP 21.8 (H) 11/11/2023   CRP 7.4 (H) 11/04/2023   CRP 11.3 (H) 01/14/2020   LABURIC 7.7 (H) 02/04/2022   REPTSTATUS 11/17/2023 FINAL 11/15/2023   GRAMSTAIN  11/15/2023    ABUNDANT WBC PRESENT,BOTH PMN AND MONONUCLEAR NO ORGANISMS SEEN    CULT  11/15/2023    RARE Normal respiratory flora-no Staph aureus or Pseudomonas seen Performed at [facility], [address], [place], [place] [address]      Lab Results  Component Value Date   ALBUMIN  3.1 (L) 12/15/2023   ALBUMIN  3.2 (L) 11/30/2023   ALBUMIN  2.8 (L) 11/27/2023    Lab Results  Component Value Date   MG 2.1 01/03/2024   MG 1.9 12/17/2023   MG 2.3 12/02/2023   Lab Results  Component Value Date   VD25OH 50.60 10/31/2023   VD25OH 42.44 10/30/2023    No results found for: PREALBUMIN    Latest  Ref Rng & Units 01/17/2024   11:31 AM 01/17/2024   11:29 AM 01/17/2024   11:23 AM  CBC EXTENDED  Hemoglobin 12.0 - 15.0 g/dL 7.5  7.5  8.5   HCT 63.9 - 46.0 % 22.0  22.0  25.0      There is no height or weight on file to calculate BMI.  Orders:  No orders of the defined types were placed in this encounter.  No orders of the defined types were placed in this encounter.    Procedures: No procedures performed  Clinical Data: No additional findings.  ROS:  All other systems negative, except as noted in the HPI. Review of Systems  Objective: Vital Signs: There were no vitals taken for this visit.  Specialty Comments:  No specialty comments available.  PMFS History: Patient Active Problem List   Diagnosis Date Noted   Acute on chronic systolic (congestive) heart failure (HCC) 12/15/2023   Chronic anemia 12/15/2023   Gross hematuria 11/30/2023   Clot retention of urine 11/30/2023   Advanced care planning/counseling discussion 11/29/2023   Goals of care, counseling/discussion 11/29/2023   Palliative care by specialist 11/29/2023   Chronic kidney disease, stage 3a (HCC) 11/23/2023   Cardiogenic shock (HCC) 11/16/2023   Acute respiratory failure with hypoxia (HCC) 11/13/2023   Anxiety state 11/12/2023   Acute on chronic systolic CHF (congestive heart failure) (HCC) 11/12/2023   Pressure injury of skin 11/11/2023   Unilateral complete BKA, left, sequela (HCC) 10/29/2023   S/P BKA (below knee amputation) unilateral, left (HCC) 10/29/2023   Necrotizing fasciitis (HCC) 10/22/2023   Gangrene of left foot (HCC) 10/21/2023   MRSA bacteremia 10/21/2023   Sepsis (HCC) 10/20/2023   AKI (acute kidney injury) (HCC) 10/20/2023   Diffuse pain in left lower extremity 10/20/2023   History of GI bleed 10/20/2023   Elevated troponin 10/20/2023   Hypokalemia 10/20/2023   History of coronary artery disease 10/20/2023   Prolonged QT interval 10/20/2023   AVM (arteriovenous malformation)  of small bowel, acquired 04/07/2023   Antiplatelet or antithrombotic long-term use 04/04/2023   Iron  deficiency anemia 04/04/2023   Acute on chronic diastolic heart failure (HCC) 04/03/2023   Osteomyelitis of foot, right, acute (HCC) 12/12/2022   CAD (coronary artery disease) 11/06/2022   Aortic stenosis 11/06/2022   Mitral regurgitation 08/27/2022   NSTEMI (non-ST elevated myocardial infarction) (HCC) 08/18/2022   Acute clinical systolic heart failure (HCC) 08/18/2022   Acute blood loss anemia 08/18/2022   Anemia 08/18/2022   Critical limb ischemia of both lower extremities (HCC)  06/19/2022   Type 2 diabetes mellitus with hyperlipidemia (HCC) 01/28/2020   Hyperlipidemia 01/28/2020   Hypertension 01/28/2020   Hypothyroidism 01/28/2020   Obesity, class 1 01/28/2020   Ulcerated, foot, right, limited to breakdown of skin (HCC)    Cellulitis of right foot    PVD (peripheral vascular disease) (HCC)    Cellulitis 01/11/2020   Blister of leg 01/06/2020   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/28/2017   Chest pain 10/28/2017   GERD (gastroesophageal reflux disease) 10/28/2017   Past Medical History:  Diagnosis Date   Anemia    Aortic stenosis    Arthritis    CHF (congestive heart failure) (HCC)    Coronary artery disease    Diabetes mellitus without complication (HCC)    type 2   GERD (gastroesophageal reflux disease)    Heart murmur    History of blood transfusion    Hypercholesteremia    Hypertension    Hypothyroidism    Mitral regurgitation    Myocardial infarction (HCC)    Peripheral vascular disease (HCC)    PONV (postoperative nausea and vomiting)    Thyroid  disease     Family History  Problem Relation Age of Onset   Diabetes Other    CAD Other     Past Surgical History:  Procedure Laterality Date   ABDOMINAL AORTOGRAM W/LOWER EXTREMITY Bilateral 01/14/2020   Procedure: ABDOMINAL AORTOGRAM W/LOWER EXTREMITY;  Surgeon: Gretta Lonni PARAS, MD;  Location: [facility]  INV[facility] LAB;  Service: Cardiovascular;  Laterality: Bilateral;   ABDOMINAL AORTOGRAM W/LOWER EXTREMITY N/A 07/05/2022   Procedure: ABDOMINAL AORTOGRAM W/LOWER EXTREMITY;  Surgeon: Gretta Lonni PARAS, MD;  Location: [facility];  Service: Cardiovascular;  Laterality: N/A;   ABDOMINAL AORTOGRAM W/LOWER EXTREMITY N/A 08/02/2022   Procedure: ABDOMINAL AORTOGRAM W/LOWER EXTREMITY;  Surgeon: Gretta Lonni PARAS, MD;  Location: [facility];  Service: Cardiovascular;  Laterality: N/A;   ABDOMINAL AORTOGRAM W/LOWER EXTREMITY N/A 08/06/2022   Procedure: ABDOMINAL AORTOGRAM W/LOWER EXTREMITY;  Surgeon: Gretta Lonni PARAS, MD;  Location: [facility];  Service: Cardiovascular;  Laterality: N/A;   AMPUTATION Bilateral 07/20/2022   Procedure: AMPUTATION RIGHT GREAT TOE AND SECOND TOE, AMPUTATION LEFT GREAT TOE;  Surgeon: Harden Jerona GAILS, MD;  Location: [facility] OR;  Service: Orthopedics;  Laterality: Bilateral;   AMPUTATION Bilateral 08/08/2022   Procedure: BILATERAL TRANSMETATARSAL AMPUTATION;  Surgeon: Harden Jerona GAILS, MD;  Location: [facility] OR;  Service: Orthopedics;  Laterality: Bilateral;   AMPUTATION Left 10/22/2023   Procedure: AMPUTATION BELOW KNEE;  Surgeon: Lanis Fonda BRAVO, MD;  Location: [facility] OR;  Service: Vascular;  Laterality: Left;   AMPUTATION TOE     BIOPSY  04/06/2023   Procedure: BIOPSY;  Surgeon: Sanaii Elspeth SQUIBB, MD;  Location: [facility] ENDOSCOPY;  Service: Gastroenterology;;   COLONOSCOPY WITH PROPOFOL  N/A 04/06/2023   Procedure: COLONOSCOPY WITH PROPOFOL ;  Surgeon: Shawntrice Elspeth SQUIBB, MD;  Location: [facility] ENDOSCOPY;  Service: Gastroenterology;  Laterality: N/A;   CYSTOSCOPY WITH STENT PLACEMENT N/A 11/30/2023   Procedure: CYSTOSCOPY, WITH FULGURATION OF BLEEDING AND CLOT EVACUATION;  Surgeon: Roseann Adine PARAS., MD;  Location: [facility] OR;  Service: Urology;  Laterality: N/A;  CLOT EVACUATION OF BLADDER   ESOPHAGOGASTRODUODENOSCOPY (EGD) WITH PROPOFOL  N/A 04/06/2023   Procedure:  ESOPHAGOGASTRODUODENOSCOPY (EGD) WITH PROPOFOL ;  Surgeon: Allia Elspeth SQUIBB, MD;  Location: [facility] ENDOSCOPY;  Service: Gastroenterology;  Laterality: N/A;   GIVENS CAPSULE STUDY N/A 04/06/2023   Procedure: GIVENS CAPSULE STUDY;  Surgeon: Margot Elspeth SQUIBB, MD;  Location: [facility]  ENDOSCOPY;  Service: Gastroenterology;  Laterality: N/A;   PERIPHERAL VASCULAR ATHERECTOMY Right 01/14/2020   Procedure: PERIPHERAL VASCULAR ATHERECTOMY;  Surgeon: Gretta Lonni PARAS, MD;  Location: [facility] INVASIVE CV LAB;  Service: Cardiovascular;  Laterality: Right;  sfa   PERIPHERAL VASCULAR INTERVENTION Right 01/14/2020   Procedure: PERIPHERAL VASCULAR INTERVENTION;  Surgeon: Gretta Lonni PARAS, MD;  Location: [facility];  Service: Cardiovascular;  Laterality: Right;  sfa stent    PERIPHERAL VASCULAR INTERVENTION  07/05/2022   Procedure: PERIPHERAL VASCULAR INTERVENTION;  Surgeon: Gretta Lonni PARAS, MD;  Location: [facility];  Service: Cardiovascular;;   PERIPHERAL VASCULAR INTERVENTION  08/02/2022   Procedure: PERIPHERAL VASCULAR INTERVENTION;  Surgeon: Gretta Lonni PARAS, MD;  Location: [facility];  Service: Cardiovascular;;   PERIPHERAL VASCULAR INTERVENTION  08/06/2022   Procedure: PERIPHERAL VASCULAR INTERVENTION;  Surgeon: Gretta Lonni PARAS, MD;  Location: [facility] INVASIVE CV LAB;  Service: Cardiovascular;;   PERIPHERAL VASCULAR THROMBECTOMY  08/02/2022   Procedure: PERIPHERAL VASCULAR THROMBECTOMY;  Surgeon: Gretta Lonni PARAS, MD;  Location: [facility];  Service: Cardiovascular;;   REVISION AMPUTATION, BELOW THE KNEE Left 10/23/2023   Procedure: REVISION AMPUTATION, BELOW THE KNEE;  Surgeon: Harden Jerona GAILS, MD;  Location: [facility] OR;  Service: Orthopedics;  Laterality: Left;  REVISION LEFT BELOW KNEE AMPUTATION   RIGHT HEART CATH N/A 11/29/2023   Procedure: RIGHT HEART CATH;  Surgeon: Rolan Ezra RAMAN, MD;  Location: [facility] INVASIVE CV LAB;  Service: Cardiovascular;  Laterality: N/A;   RIGHT/LEFT  HEART CATH AND CORONARY ANGIOGRAPHY N/A 08/23/2022   Procedure: RIGHT/LEFT HEART CATH AND CORONARY ANGIOGRAPHY;  Surgeon: Dann Candyce RAMAN, MD;  Location: [facility] INVASIVE CV LAB;  Service: Cardiovascular;  Laterality: N/A;   SKIN SPLIT GRAFT Right 03/18/2020   Procedure: SKIN GRAFTING RIGHT FOOT ULCER;  Surgeon: Harden Jerona GAILS, MD;  Location: [facility] OR;  Service: Orthopedics;  Laterality: Right;   STUMP REVISION Right 11/30/2022   Procedure: REVISION RIGHT TRANSMETATARSAL AMPUTATION;  Surgeon: Harden Jerona GAILS, MD;  Location: [facility] OR;  Service: Orthopedics;  Laterality: Right;   TEE WITHOUT CARDIOVERSION N/A 08/27/2022   Procedure: TRANSESOPHAGEAL ECHOCARDIOGRAM;  Surgeon: Hobart Powell BRAVO, MD;  Location: [facility] INVASIVE CV LAB;  Service: Cardiovascular;  Laterality: N/A;   TRANSESOPHAGEAL ECHOCARDIOGRAM (CATH LAB) N/A 10/24/2023   Procedure: TRANSESOPHAGEAL ECHOCARDIOGRAM;  Surgeon: Michele Richardson, DO;  Location: [facility] INV[facility] LAB;  Service: Cardiovascular;  Laterality: N/A;   WISDOM TOOTH EXTRACTION     Social History   Occupational History   Not on file  Tobacco Use   Smoking status: Former    Current packs/day: 0.00    Types: Cigarettes    Quit date: 01/2017    Years since quitting: 6.9   Smokeless tobacco: Never  Vaping Use   Vaping status: Never Used  Substance and Sexual Activity   Alcohol use: Not Currently   Drug use: Yes    Types: Marijuana    Comment: on ocassion- Last time - 11/13/22   Sexual activity: Yes    Birth control/protection: Post-menopausal

## 2024-01-17 NOTE — Progress Notes (Signed)
 Discharge instructions reviewed with patient and mother at the bedside. Denies questions or concerns at this time. TR band removed so s/s of complications noted. PT was assisted to wheelchair at bedside d/t baseline conditions. PT was able to void in the bathroom without difficulty. PT was escorted from the unit via wheel chair to personal vehicle.

## 2024-01-17 NOTE — Telephone Encounter (Signed)
 Called to confirm/remind patient of their appointment [facility] on [facility].   Appointment:   [] Confirmed  [x] Left mess   [] No answer/No voice mail  [] VM Full/unable to leave message  [] Phone not in service  And  to bring all medications and/or complete list.

## 2024-01-19 ENCOUNTER — Encounter (HOSPITAL_COMMUNITY): Payer: Self-pay | Admitting: Internal Medicine

## 2024-01-20 ENCOUNTER — Other Ambulatory Visit: Payer: Self-pay

## 2024-01-20 ENCOUNTER — Emergency Department (HOSPITAL_COMMUNITY)

## 2024-01-20 ENCOUNTER — Encounter (HOSPITAL_COMMUNITY): Payer: Self-pay

## 2024-01-20 ENCOUNTER — Ambulatory Visit (HOSPITAL_BASED_OUTPATIENT_CLINIC_OR_DEPARTMENT_OTHER)
Admission: RE | Admit: 2024-01-20 | Discharge: 2024-01-20 | Disposition: A | Discharge status: Home / Self Care | Source: Ambulatory Visit | Attending: Cardiology | Admitting: Cardiology

## 2024-01-20 ENCOUNTER — Inpatient Hospital Stay (HOSPITAL_COMMUNITY)
Admission: EM | Admit: 2024-01-20 | Discharge: 2024-01-24 | DRG: 286 | Disposition: A | Discharge status: Home / Self Care | Attending: Internal Medicine | Admitting: Internal Medicine

## 2024-01-20 VITALS — BP 120/76 | HR 102

## 2024-01-20 DIAGNOSIS — I130 Hypertensive heart and chronic kidney disease with heart failure and stage 1 through stage 4 chronic kidney disease, or unspecified chronic kidney disease: Secondary | ICD-10-CM | POA: Insufficient documentation

## 2024-01-20 DIAGNOSIS — Z79899 Other long term (current) drug therapy: Secondary | ICD-10-CM | POA: Insufficient documentation

## 2024-01-20 DIAGNOSIS — I2510 Atherosclerotic heart disease of native coronary artery without angina pectoris: Secondary | ICD-10-CM | POA: Insufficient documentation

## 2024-01-20 DIAGNOSIS — I959 Hypotension, unspecified: Secondary | ICD-10-CM

## 2024-01-20 DIAGNOSIS — D631 Anemia in chronic kidney disease: Secondary | ICD-10-CM | POA: Diagnosis present

## 2024-01-20 DIAGNOSIS — N179 Acute kidney failure, unspecified: Principal | ICD-10-CM

## 2024-01-20 DIAGNOSIS — D509 Iron deficiency anemia, unspecified: Secondary | ICD-10-CM | POA: Diagnosis present

## 2024-01-20 DIAGNOSIS — I9589 Other hypotension: Secondary | ICD-10-CM | POA: Diagnosis present

## 2024-01-20 DIAGNOSIS — E039 Hypothyroidism, unspecified: Secondary | ICD-10-CM | POA: Diagnosis present

## 2024-01-20 DIAGNOSIS — Z9981 Dependence on supplemental oxygen: Secondary | ICD-10-CM | POA: Diagnosis not present

## 2024-01-20 DIAGNOSIS — Z89612 Acquired absence of left leg above knee: Secondary | ICD-10-CM | POA: Diagnosis not present

## 2024-01-20 DIAGNOSIS — E1151 Type 2 diabetes mellitus with diabetic peripheral angiopathy without gangrene: Secondary | ICD-10-CM | POA: Insufficient documentation

## 2024-01-20 DIAGNOSIS — I739 Peripheral vascular disease, unspecified: Secondary | ICD-10-CM | POA: Diagnosis not present

## 2024-01-20 DIAGNOSIS — E1165 Type 2 diabetes mellitus with hyperglycemia: Secondary | ICD-10-CM | POA: Diagnosis present

## 2024-01-20 DIAGNOSIS — E7800 Pure hypercholesterolemia, unspecified: Secondary | ICD-10-CM | POA: Diagnosis present

## 2024-01-20 DIAGNOSIS — I10 Essential (primary) hypertension: Secondary | ICD-10-CM | POA: Diagnosis present

## 2024-01-20 DIAGNOSIS — M1990 Unspecified osteoarthritis, unspecified site: Secondary | ICD-10-CM | POA: Diagnosis present

## 2024-01-20 DIAGNOSIS — E118 Type 2 diabetes mellitus with unspecified complications: Secondary | ICD-10-CM | POA: Diagnosis present

## 2024-01-20 DIAGNOSIS — I493 Ventricular premature depolarization: Secondary | ICD-10-CM | POA: Diagnosis present

## 2024-01-20 DIAGNOSIS — Z7982 Long term (current) use of aspirin: Secondary | ICD-10-CM | POA: Insufficient documentation

## 2024-01-20 DIAGNOSIS — I080 Rheumatic disorders of both mitral and aortic valves: Secondary | ICD-10-CM | POA: Diagnosis present

## 2024-01-20 DIAGNOSIS — I110 Hypertensive heart disease with heart failure: Secondary | ICD-10-CM | POA: Diagnosis present

## 2024-01-20 DIAGNOSIS — K219 Gastro-esophageal reflux disease without esophagitis: Secondary | ICD-10-CM | POA: Diagnosis present

## 2024-01-20 DIAGNOSIS — E1169 Type 2 diabetes mellitus with other specified complication: Secondary | ICD-10-CM | POA: Diagnosis present

## 2024-01-20 DIAGNOSIS — I252 Old myocardial infarction: Secondary | ICD-10-CM

## 2024-01-20 DIAGNOSIS — Z79890 Hormone replacement therapy (postmenopausal): Secondary | ICD-10-CM | POA: Diagnosis not present

## 2024-01-20 DIAGNOSIS — Z7902 Long term (current) use of antithrombotics/antiplatelets: Secondary | ICD-10-CM

## 2024-01-20 DIAGNOSIS — Z89512 Acquired absence of left leg below knee: Secondary | ICD-10-CM

## 2024-01-20 DIAGNOSIS — I2582 Chronic total occlusion of coronary artery: Secondary | ICD-10-CM | POA: Diagnosis present

## 2024-01-20 DIAGNOSIS — Z794 Long term (current) use of insulin: Secondary | ICD-10-CM | POA: Insufficient documentation

## 2024-01-20 DIAGNOSIS — N1831 Chronic kidney disease, stage 3a: Secondary | ICD-10-CM | POA: Insufficient documentation

## 2024-01-20 DIAGNOSIS — E66811 Obesity, class 1: Secondary | ICD-10-CM | POA: Diagnosis not present

## 2024-01-20 DIAGNOSIS — I5023 Acute on chronic systolic (congestive) heart failure: Secondary | ICD-10-CM | POA: Insufficient documentation

## 2024-01-20 DIAGNOSIS — E1122 Type 2 diabetes mellitus with diabetic chronic kidney disease: Secondary | ICD-10-CM | POA: Diagnosis present

## 2024-01-20 DIAGNOSIS — D649 Anemia, unspecified: Secondary | ICD-10-CM | POA: Insufficient documentation

## 2024-01-20 DIAGNOSIS — E1142 Type 2 diabetes mellitus with diabetic polyneuropathy: Secondary | ICD-10-CM | POA: Diagnosis present

## 2024-01-20 DIAGNOSIS — I255 Ischemic cardiomyopathy: Secondary | ICD-10-CM | POA: Diagnosis present

## 2024-01-20 DIAGNOSIS — Z993 Dependence on wheelchair: Secondary | ICD-10-CM

## 2024-01-20 DIAGNOSIS — R0600 Dyspnea, unspecified: Secondary | ICD-10-CM | POA: Insufficient documentation

## 2024-01-20 DIAGNOSIS — N1832 Chronic kidney disease, stage 3b: Secondary | ICD-10-CM | POA: Diagnosis present

## 2024-01-20 DIAGNOSIS — N184 Chronic kidney disease, stage 4 (severe): Secondary | ICD-10-CM | POA: Diagnosis present

## 2024-01-20 DIAGNOSIS — Z87891 Personal history of nicotine dependence: Secondary | ICD-10-CM | POA: Insufficient documentation

## 2024-01-20 DIAGNOSIS — Z6832 Body mass index (BMI) 32.0-32.9, adult: Secondary | ICD-10-CM | POA: Diagnosis not present

## 2024-01-20 DIAGNOSIS — Z8614 Personal history of Methicillin resistant Staphylococcus aureus infection: Secondary | ICD-10-CM

## 2024-01-20 DIAGNOSIS — I5022 Chronic systolic (congestive) heart failure: Secondary | ICD-10-CM

## 2024-01-20 DIAGNOSIS — Z8249 Family history of ischemic heart disease and other diseases of the circulatory system: Secondary | ICD-10-CM

## 2024-01-20 DIAGNOSIS — I152 Hypertension secondary to endocrine disorders: Secondary | ICD-10-CM | POA: Diagnosis present

## 2024-01-20 DIAGNOSIS — I700 Atherosclerosis of aorta: Secondary | ICD-10-CM | POA: Diagnosis present

## 2024-01-20 DIAGNOSIS — I509 Heart failure, unspecified: Secondary | ICD-10-CM

## 2024-01-20 DIAGNOSIS — R7989 Other specified abnormal findings of blood chemistry: Secondary | ICD-10-CM | POA: Diagnosis not present

## 2024-01-20 DIAGNOSIS — Z833 Family history of diabetes mellitus: Secondary | ICD-10-CM

## 2024-01-20 DIAGNOSIS — E785 Hyperlipidemia, unspecified: Secondary | ICD-10-CM | POA: Diagnosis not present

## 2024-01-20 LAB — TROPONIN I (HIGH SENSITIVITY)
Troponin I (High Sensitivity): 519 ng/L (ref <18)
Troponin I (High Sensitivity): 547 ng/L (ref <18)

## 2024-01-20 LAB — CBC
HCT: 26.4 % — ABNORMAL LOW (ref 36.0–46.0)
Hemoglobin: 8.2 g/dL — ABNORMAL LOW (ref 12.0–15.0)
MCH: 28.7 pg (ref 26.0–34.0)
MCHC: 31.1 g/dL (ref 30.0–36.0)
MCV: 92.3 fL (ref 80.0–100.0)
Platelets: 268 K/uL (ref 150–400)
RBC: 2.86 MIL/uL — ABNORMAL LOW (ref 3.87–5.11)
RDW: 14.4 % (ref 11.5–15.5)
WBC: 10 K/uL (ref 4.0–10.5)
nRBC: 0 % (ref 0.0–0.2)

## 2024-01-20 LAB — I-STAT CG4 LACTIC ACID, ED: Lactic Acid, Venous: 1.1 mmol/L (ref 0.5–1.9)

## 2024-01-20 LAB — BASIC METABOLIC PANEL WITH GFR
Anion gap: 15 (ref 5–15)
BUN: 99 mg/dL — ABNORMAL HIGH (ref 6–20)
CO2: 19 mmol/L — ABNORMAL LOW (ref 22–32)
Calcium: 9.1 mg/dL (ref 8.9–10.3)
Chloride: 102 mmol/L (ref 98–111)
Creatinine, Ser: 2.49 mg/dL — ABNORMAL HIGH (ref 0.44–1.00)
GFR, Estimated: 22 mL/min — ABNORMAL LOW (ref >60)
Glucose, Bld: 185 mg/dL — ABNORMAL HIGH (ref 70–99)
Potassium: 4.1 mmol/L (ref 3.5–5.1)
Sodium: 136 mmol/L (ref 135–145)

## 2024-01-20 LAB — BRAIN NATRIURETIC PEPTIDE: B Natriuretic Peptide: 1011.9 pg/mL — ABNORMAL HIGH (ref 0.0–100.0)

## 2024-01-20 MED ORDER — INSULIN ASPART 100 UNIT/ML IJ SOLN
0.0000 [IU] | Freq: Every day | INTRAMUSCULAR | Status: DC
  Administered 2024-01-22 – 2024-01-23 (×2): 2 [IU] via SUBCUTANEOUS

## 2024-01-20 MED ORDER — PROCHLORPERAZINE EDISYLATE 10 MG/2ML IJ SOLN: 5.0000 mg | Freq: Four times a day (QID) | INTRAMUSCULAR | Status: DC | PRN

## 2024-01-20 MED ORDER — INSULIN ASPART 100 UNIT/ML IJ SOLN
0.0000 [IU] | Freq: Three times a day (TID) | INTRAMUSCULAR | Status: DC
  Administered 2024-01-21: 3 [IU] via SUBCUTANEOUS
  Administered 2024-01-21 (×2): 2 [IU] via SUBCUTANEOUS
  Administered 2024-01-22: 5 [IU] via SUBCUTANEOUS
  Administered 2024-01-22: 2 [IU] via SUBCUTANEOUS
  Administered 2024-01-22: 1 [IU] via SUBCUTANEOUS
  Administered 2024-01-23: 3 [IU] via SUBCUTANEOUS
  Administered 2024-01-23: 2 [IU] via SUBCUTANEOUS
  Administered 2024-01-23 – 2024-01-24 (×2): 1 [IU] via SUBCUTANEOUS

## 2024-01-20 MED ORDER — MIDODRINE HCL 5 MG PO TABS
5.0000 mg | ORAL_TABLET | Freq: Three times a day (TID) | ORAL | Status: DC
  Administered 2024-01-21 – 2024-01-24 (×11): 5 mg via ORAL
  Filled 2024-01-20 (×11): qty 1

## 2024-01-20 MED ORDER — ACETAMINOPHEN 325 MG PO TABS: 650.0000 mg | ORAL_TABLET | Freq: Four times a day (QID) | ORAL | Status: DC | PRN

## 2024-01-20 MED ORDER — MELATONIN 5 MG PO TABS: 5.0000 mg | ORAL_TABLET | Freq: Every evening | ORAL | Status: DC | PRN

## 2024-01-20 MED ORDER — POLYETHYLENE GLYCOL 3350 17 G PO PACK: 17.0000 g | PACK | Freq: Every day | ORAL | Status: DC | PRN

## 2024-01-20 MED ORDER — HEPARIN SODIUM (PORCINE) 5000 UNIT/ML IJ SOLN
5000.0000 [IU] | Freq: Three times a day (TID) | INTRAMUSCULAR | Status: DC
  Administered 2024-01-21 – 2024-01-22 (×3): 5000 [IU] via SUBCUTANEOUS
  Filled 2024-01-20 (×3): qty 1

## 2024-01-20 MED ORDER — FUROSEMIDE 10 MG/ML IJ SOLN
160.0000 mg | Freq: Once | INTRAVENOUS | Status: AC
  Administered 2024-01-20: 160 mg via INTRAVENOUS
  Filled 2024-01-20: qty 10

## 2024-01-20 NOTE — H&P (Signed)
 History and Physical  Kayla Schmitt FMW:[id number] DOB: 09/10/1965 DOA: 01/20/2024  Referring physician: Dr. Dorcus, EDP  PCP: [name], NP  Outpatient Specialists: Cardiology, orthopedic surgery, urology. Patient coming from: Home.  Chief Complaint: Worsening shortness of breath.  HPI: Kayla Schmitt is a 58 y.o. female with medical history significant for coronary artery disease, mitral regurgitation, PAD status post stent and left above-the-knee amputation, type 2 diabetes, hyperlipidemia, chronic hypotension on midodrine , anemia of chronic disease, hypothyroidism, GERD, recent right heart catheterization 01/17/2024, who presents to the ER with worsening shortness of breath for the past 3 days.  Associated with right lower extremity edema.  Denies any chest pain or congestion.  Endorses compliance with her cardiac medications including her home diuretics.  In the ER, right JVD and right lower extremity 2+ pitting edema on exam.  BNP greater than 1000.  Chest x-ray showing cardiomegaly and pulmonary edema.  Received 160 mg IV Lasix  in the ER.  EDP discussed the case with cardiology who will see in consultation.  Recommended admission by hospital medicine team.  ED Course: Temperature 97.9.  BP 106/82, pulse 101, respiratory 20, O2 saturation 100% on 3 L.  Review of Systems: Review of systems as noted in the HPI. All other systems reviewed and are negative.   Past Medical History:  Diagnosis Date   Anemia    Aortic stenosis    Arthritis    CHF (congestive heart failure) (HCC)    Coronary artery disease    Diabetes mellitus without complication (HCC)    type 2   GERD (gastroesophageal reflux disease)    Heart murmur    History of blood transfusion    Hypercholesteremia    Hypertension    Hypothyroidism    Mitral regurgitation    Myocardial infarction [facility])    Peripheral vascular disease    PONV (postoperative nausea and vomiting)    Thyroid  disease     Past Surgical History:  Procedure Laterality Date   ABDOMINAL AORTOGRAM W/LOWER EXTREMITY Bilateral 01/14/2020   Procedure: ABDOMINAL AORTOGRAM W/LOWER EXTREMITY;  Surgeon: Gretta Lonni PARAS, MD;  [facility]: [facility] INV[facility] LAB;  Service: Cardiovascular;  Laterality: Bilateral;   ABDOMINAL AORTOGRAM W/LOWER EXTREMITY N/A 07/05/2022   Procedure: ABDOMINAL AORTOGRAM W/LOWER EXTREMITY;  Surgeon: Gretta Lonni PARAS, MD;  Location: [facility];  Service: Cardiovascular;  Laterality: N/A;   ABDOMINAL AORTOGRAM W/LOWER EXTREMITY N/A 08/02/2022   Procedure: ABDOMINAL AORTOGRAM W/LOWER EXTREMITY;  Surgeon: Gretta Lonni PARAS, MD;  [facility]: [facility];  Service: Cardiovascular;  Laterality: N/A;   ABDOMINAL AORTOGRAM W/LOWER EXTREMITY N/A 08/06/2022   Procedure: ABDOMINAL AORTOGRAM W/LOWER EXTREMITY;  Surgeon: Gretta Lonni PARAS, MD;  [facility]: [facility];  Service: Cardiovascular;  Laterality: N/A;   AMPUTATION Bilateral 07/20/2022   Procedure: AMPUTATION RIGHT GREAT TOE AND SECOND TOE, AMPUTATION LEFT GREAT TOE;  Surgeon: Harden Jerona GAILS, MD;  Location: [facility] OR;  Service: Orthopedics;  Laterality: Bilateral;   AMPUTATION Bilateral 08/08/2022   Procedure: BILATERAL TRANSMETATARSAL AMPUTATION;  Surgeon: Harden Jerona GAILS, MD;  Location: [facility] OR;  Service: Orthopedics;  Laterality: Bilateral;   AMPUTATION Left 10/22/2023   Procedure: AMPUTATION BELOW KNEE;  Surgeon: Lanis Fonda BRAVO, MD;  Location: [facility] OR;  Service: Vascular;  Laterality: Left;   AMPUTATION TOE     BIOPSY  04/06/2023   Procedure: BIOPSY;  Surgeon: Briel Elspeth SQUIBB, MD;  Location: [facility] ENDOSCOPY;  Service: Gastroenterology;;   COLONOSCOPY WITH PROPOFOL  N/A 04/06/2023   Procedure: COLONOSCOPY  WITH PROPOFOL ;  Surgeon: Elenora Elspeth SQUIBB, MD;  Location: [facility] ENDOSCOPY;  Service: Gastroenterology;  Laterality: N/A;   CYSTOSCOPY WITH STENT PLACEMENT N/A 11/30/2023   Procedure: CYSTOSCOPY, WITH FULGURATION OF BLEEDING AND CLOT  EVACUATION;  Surgeon: Roseann Adine PARAS., MD;  Location: [facility] OR;  Service: Urology;  Laterality: N/A;  CLOT EVACUATION OF BLADDER   ESOPHAGOGASTRODUODENOSCOPY (EGD) WITH PROPOFOL  N/A 04/06/2023   Procedure: ESOPHAGOGASTRODUODENOSCOPY (EGD) WITH PROPOFOL ;  Surgeon: Che Elspeth SQUIBB, MD;  Location: [facility] ENDOSCOPY;  Service: Gastroenterology;  Laterality: N/A;   GIVENS CAPSULE STUDY N/A 04/06/2023   Procedure: GIVENS CAPSULE STUDY;  Surgeon: Kemisha Elspeth SQUIBB, MD;  Location: [facility] ENDOSCOPY;  Service: Gastroenterology;  Laterality: N/A;   PERIPHERAL VASCULAR ATHERECTOMY Right 01/14/2020   Procedure: PERIPHERAL VASCULAR ATHERECTOMY;  Surgeon: Gretta Lonni PARAS, MD;  Location: [facility] INVASIVE CV LAB;  Service: Cardiovascular;  Laterality: Right;  sfa   PERIPHERAL VASCULAR INTERVENTION Right 01/14/2020   Procedure: PERIPHERAL VASCULAR INTERVENTION;  Surgeon: Gretta Lonni PARAS, MD;  [facility];  Service: Cardiovascular;  Laterality: Right;  sfa stent    PERIPHERAL VASCULAR INTERVENTION  07/05/2022   Procedure: PERIPHERAL VASCULAR INTERVENTION;  Surgeon: Gretta Lonni PARAS, MD;  [facility];  Service: Cardiovascular;;   PERIPHERAL VASCULAR INTERVENTION  08/02/2022   Procedure: PERIPHERAL VASCULAR INTERVENTION;  Surgeon: Gretta Lonni PARAS, MD;  [facility];  Service: Cardiovascular;;   PERIPHERAL VASCULAR INTERVENTION  08/06/2022   Procedure: PERIPHERAL VASCULAR INTERVENTION;  Surgeon: Gretta Lonni PARAS, MD;  Location: [facility] INVASIVE CV LAB;  Service: Cardiovascular;;   PERIPHERAL VASCULAR THROMBECTOMY  08/02/2022   Procedure: PERIPHERAL VASCULAR THROMBECTOMY;  Surgeon: Gretta Lonni PARAS, MD;  [facility];  Service: Cardiovascular;;   REVISION AMPUTATION, BELOW THE KNEE Left 10/23/2023   Procedure: REVISION AMPUTATION, BELOW THE KNEE;  Surgeon: Harden Jerona GAILS, MD;  Location: [facility] OR;  Service: Orthopedics;  Laterality: Left;  REVISION LEFT  BELOW KNEE AMPUTATION   RIGHT HEART CATH N/A 11/29/2023   Procedure: RIGHT HEART CATH;  Surgeon: Rolan Ezra RAMAN, MD;  Location: [facility] INVASIVE CV LAB;  Service: Cardiovascular;  Laterality: N/A;   RIGHT HEART CATH AND CORONARY ANGIOGRAPHY N/A 01/17/2024   Procedure: RIGHT HEART CATH AND CORONARY ANGIOGRAPHY;  Surgeon: Wendel Lurena POUR, MD;  [facility];  Service: Cardiovascular;  Laterality: N/A;   RIGHT/LEFT HEART CATH AND CORONARY ANGIOGRAPHY N/A 08/23/2022   Procedure: RIGHT/LEFT HEART CATH AND CORONARY ANGIOGRAPHY;  Surgeon: Dann Candyce RAMAN, MD;  Location: [facility] INVASIVE CV LAB;  Service: Cardiovascular;  Laterality: N/A;   SKIN SPLIT GRAFT Right 03/18/2020   Procedure: SKIN GRAFTING RIGHT FOOT ULCER;  Surgeon: Harden Jerona GAILS, MD;  Location: [facility] OR;  Service: Orthopedics;  Laterality: Right;   STUMP REVISION Right 11/30/2022   Procedure: REVISION RIGHT TRANSMETATARSAL AMPUTATION;  Surgeon: Harden Jerona GAILS, MD;  Location: [facility] OR;  Service: Orthopedics;  Laterality: Right;   TEE WITHOUT CARDIOVERSION N/A 08/27/2022   Procedure: TRANSESOPHAGEAL ECHOCARDIOGRAM;  Surgeon: Hobart Powell BRAVO, MD;  Location: [facility] INVASIVE CV LAB;  Service: Cardiovascular;  Laterality: N/A;   TRANSESOPHAGEAL ECHOCARDIOGRAM (CATH LAB) N/A 10/24/2023   Procedure: TRANSESOPHAGEAL ECHOCARDIOGRAM;  Surgeon: Michele Richardson, DO;  [facility];  Service: Cardiovascular;  Laterality: N/A;   WISDOM TOOTH EXTRACTION      Social History:  reports that she quit smoking about 6 years ago. Her smoking use included cigarettes. She has never used smokeless tobacco. She reports that  she does not currently use alcohol. She reports current drug use. Drug: Marijuana.   Allergies  Allergen Reactions   Trental  [Pentoxifylline ] Nausea And Vomiting   Vibramycin [Doxycycline] Nausea And Vomiting    Family History  Problem Relation Age of Onset   Diabetes Other    CAD Other       Prior to Admission  medications   Medication Sig Start Date End Date Taking? Authorizing Provider  acetaminophen  (TYLENOL ) 500 MG tablet Take 1,000 mg by mouth 2 (two) times daily as needed for moderate pain (pain score 4-6), headache or fever.    [provider]  Alcohol Swabs  (PHARMACIST CHOICE ALCOHOL) PADS Apply topically. 04/09/23   [provider]  aspirin  EC 81 MG tablet Take 1 tablet (81 mg total) by mouth daily. 01/03/24   Milford, Harlene HERO, FNP  atorvastatin  (LIPITOR) 40 MG tablet Take 40 mg by mouth daily. 12/14/19   [provider]  clopidogrel  (PLAVIX ) 75 MG tablet Take 1 tablet (75 mg total) by mouth daily. 01/03/24   Milford, Harlene HERO, FNP  diphenhydrAMINE  HCl, Sleep, (ZZZQUIL) 25 MG CAPS Take 25 mg by mouth at bedtime.    [provider]  ferrous sulfate  325 (65 FE) MG EC tablet Take 1 tablet (325 mg total) by mouth in the morning and at bedtime. 12/18/23   Bryn Bernardino NOVAK, MD  gabapentin  (NEURONTIN ) 300 MG capsule Take 300 mg by mouth at bedtime.    [provider]  insulin  degludec (TRESIBA  FLEXTOUCH) 100 UNIT/ML FlexTouch Pen Inject 6 Units into the skin at bedtime. 11/27/23   Arrien, Mauricio Daniel, MD  levothyroxine  (SYNTHROID ) 150 MCG tablet Take 150 mcg by mouth daily before breakfast. 12/02/19   [provider]  midodrine  (PROAMATINE ) 5 MG tablet Take 1 tablet (5 mg total) by mouth 3 (three) times daily with meals. 01/06/24   Milford, Harlene HERO, FNP  nitroGLYCERIN  (NITROSTAT ) 0.4 MG SL tablet Place 1 tablet (0.4 mg total) under the tongue every 5 (five) minutes x 3 doses as needed for chest pain (if no relief after 3rd dose proceed to ED or call 911). 11/06/2022-New 12/09/23   Miriam Norris, NP  OXYGEN  Inhale 2 L into the lungs at bedtime.    [provider]  pantoprazole  (PROTONIX ) 40 MG tablet Take 40 mg by mouth daily. 12/21/19   [provider]  polyethylene glycol (MIRALAX  / GLYCOLAX ) 17 g packet Take 17 g by mouth 2 (two) times  daily. 10/29/23   Hongalgi, Anand D, MD  potassium chloride  SA (KLOR-CON  M) 20 MEQ tablet Take 2 tablets (40 mEq total) by mouth daily. 01/06/24   Milford, Harlene HERO, FNP  promethazine  (PHENERGAN ) 25 MG tablet Take 1 tablet (25 mg total) by mouth every 6 (six) hours as needed for nausea or vomiting. 10/15/23   Francesca Elsie CROME, MD  torsemide  (DEMADEX ) 20 MG tablet Take 4 tablets (80 mg total) by mouth 2 (two) times daily. 01/03/24   Glena Harlene HERO, FNP    Physical Exam: BP (!) 104/58   Pulse 97   Temp 97.9 F (36.6 C) (Oral)   Resp (!) 24   SpO2 100%   General: 58 y.o. year-old female well developed well nourished in no acute distress.  Alert and oriented x3. Cardiovascular: Regular rate and rhythm with no rubs or gallops.  No thyromegaly or JVD noted.  Right lower extremity 2+ pitting edema. Respiratory: Mild rales at bases. Good inspiratory effort. Abdomen: Soft nontender nondistended with normal bowel  sounds x4 quadrants. Muskuloskeletal: No cyanosis or clubbing noted bilaterally Neuro: CN II-XII intact, strength, sensation, reflexes Skin: No ulcerative lesions noted or rashes Psychiatry: Judgement and insight appear normal. Mood is appropriate for condition and setting          Labs on Admission:  Basic Metabolic Panel: Recent Labs  Lab 01/17/24 0806 01/17/24 1123 01/17/24 1129 01/17/24 1131 01/20/24 1501  NA 137 141 143 143 136  K 4.5 3.9 3.6 3.7 4.1  CL 111  --   --   --  102  CO2 15*  --   --   --  19*  GLUCOSE 136*  --   --   --  185*  BUN 23*  --   --   --  99*  CREATININE 1.97*  --   --   --  2.49*  CALCIUM  9.1  --   --   --  9.1   Liver Function Tests: No results for input(s): AST, ALT, ALKPHOS, BILITOT, PROT, ALBUMIN  in the last 168 hours. No results for input(s): LIPASE, AMYLASE in the last 168 hours. No results for input(s): AMMONIA in the last 168 hours. CBC: Recent Labs  Lab 01/17/24 1123 01/17/24 1129 01/17/24 1131 01/20/24 1501   WBC  --   --   --  10.0  HGB 8.5* 7.5* 7.5* 8.2*  HCT 25.0* 22.0* 22.0* 26.4*  MCV  --   --   --  92.3  PLT  --   --   --  268   Cardiac Enzymes: No results for input(s): CKTOTAL, CKMB, CKMBINDEX, TROPONINI in the last 168 hours.  BNP (last 3 results) Recent Labs    12/15/23 0959 01/03/24 1448 01/20/24 1501  BNP 1,108.0* 1,131.9* 1,011.9*    ProBNP (last 3 results) No results for input(s): PROBNP in the last 8760 hours.  CBG: Recent Labs  Lab 01/17/24 0711  GLUCAP 161*    Radiological Exams on Admission: DG Chest 2 View Result Date: 01/20/2024 CLINICAL DATA:  Shortness of breath. EXAM: CHEST - 2 VIEW COMPARISON:  12/15/2023 and CT chest 08/18/2022. FINDINGS: Trachea is midline. Heart size is grossly stable. Diffuse airspace opacification with bilateral pleural effusions. IMPRESSION: Congestive heart failure. Electronically Signed   By: Newell Eke M.D.   On: 01/20/2024 16:09    EKG: I independently viewed the EKG done and my findings are as followed: Sinus rhythm rate of 96.  Nonspecific ST-T changes.  QTc 485.  Assessment/Plan Present on Admission:  Acute on chronic systolic (congestive) heart failure (HCC)  Principal Problem:   Acute on chronic systolic (congestive) heart failure (HCC)  Acute on chronic HFrEF BNP greater than 1000, chest x-ray with cardiomegaly and pulm edema, right JVD, right lower extremity 2+ pitting edema. Received 160 mg IV Lasix  in the ER Monitor urine output Strict I's and O's and daily weight Cardiology consulted.  Elevated troponin Recent right heart cath Troponin 519, 547 Cycle troponin Resume home DAPT, statin Management per cardiology Limited 2D echo  AKI on CKD 3B, suspect multifactorial Possibly dehydration, recent heart cath Baseline creatinine 1.6 with GFR 37 Presented with creatinine of 2.49 Avoid nephrotoxic agents, and hypotension. Monitor urine output Repeat BMP in the morning  Type 2 diabetes with  hyperglycemia Last hemoglobin A1c 5.5 on 10/28/2023 Heart healthy carb modified diet Insulin  sign scale.  Diabetic polyneuropathy Resume home gabapentin   Chronic hypotension Resume home midodrine  Closely monitor vital signs and maintain MAP greater than 65  Hyperlipidemia Resume home statin  Iron  deficiency anemia/anemia of chronic disease Resume home regimen  Hypothyroidism Resume home levothyroxine   GERD Resume home PPI   Critical care time: 55 minutes.   DVT prophylaxis: Subcu heparin  3 times daily  Code Status: Full code.  Family Communication: Mother at bedside.  Disposition Plan: Admitted to progressive care unit.  Consults called: Cardiology consulted by EDP.  Admission status: Inpatient status.   Status is: Inpatient The patient requires at least 2 midnights for further evaluation and treatment of present condition.   Terry LOISE Hurst MD [facility] Pager [phone]  If 7PM-7AM, please contact night-coverage [email or web address] Password [email or web address]  01/20/2024, 9:08 PM

## 2024-01-20 NOTE — ED Triage Notes (Signed)
 Pt had a cardiac cath procedure Friday to look at the valves in her heart, ever since then she has felt more SOB and has had to increase her oxygen  requirement. No increased swelling. Denies chest pain

## 2024-01-20 NOTE — ED Provider Notes (Signed)
 [place] EMERGENCY DEPARTMENT AT [facility] Provider Note   CSN: [id number] Arrival date & time: 01/20/24  1452     Patient presents with: Shortness of Breath   Kayla Schmitt is a 58 y.o. female.    Shortness of Breath    Patient is a 58 year old female with PMH of PAD s/p b/l SFA stents s/p L AKA, T2DM, HLD, HTN, chronic anemia with history of GI bleed secondary to AVM, CAD with recent admission for NSTEMI 04/24 with catheterization indicative of 3V disease and significant MR, and recent right heart catheterization 09/19 presenting to the ED with chief complaint of worsening SOB at rest, worse with exertion, x 3 days since recent cardiac catheterization on continuous home oxygen  2 L [place] (baseline 2 L nocturnal).  Patient denies chest pain, cough, congestion, headache, vision changes, palpitations, nausea, vomiting, back pain, abdominal pain, changes in urination, or changes in bowel movements.  No recent fevers or chills.  No syncope.  Patient endorses normal urinary output.   Prior to Admission medications   Medication Sig Start Date End Date Taking? Authorizing Provider  acetaminophen  (TYLENOL ) 500 MG tablet Take 1,000 mg by mouth 2 (two) times daily as needed for moderate pain (pain score 4-6), headache or fever.    [provider]  Alcohol Swabs  (PHARMACIST CHOICE ALCOHOL) PADS Apply topically. 04/09/23   [provider]  aspirin  EC 81 MG tablet Take 1 tablet (81 mg total) by mouth daily. 01/03/24   Milford, Harlene HERO, FNP  atorvastatin  (LIPITOR) 40 MG tablet Take 40 mg by mouth daily. 12/14/19   [provider]  clopidogrel  (PLAVIX ) 75 MG tablet Take 1 tablet (75 mg total) by mouth daily. 01/03/24   Milford, Harlene HERO, FNP  diphenhydrAMINE  HCl, Sleep, (ZZZQUIL) 25 MG CAPS Take 25 mg by mouth at bedtime.    [provider]  ferrous sulfate  325 (65 FE) MG EC tablet Take 1 tablet (325 mg total) by mouth in the morning and at bedtime.  12/18/23   Bryn Bernardino NOVAK, MD  gabapentin  (NEURONTIN ) 300 MG capsule Take 300 mg by mouth at bedtime.    [provider]  insulin  degludec (TRESIBA  FLEXTOUCH) 100 UNIT/ML FlexTouch Pen Inject 6 Units into the skin at bedtime. 11/27/23   Arrien, Mauricio Daniel, MD  levothyroxine  (SYNTHROID ) 150 MCG tablet Take 150 mcg by mouth daily before breakfast. 12/02/19   [provider]  midodrine  (PROAMATINE ) 5 MG tablet Take 1 tablet (5 mg total) by mouth 3 (three) times daily with meals. 01/06/24   Milford, Harlene HERO, FNP  nitroGLYCERIN  (NITROSTAT ) 0.4 MG SL tablet Place 1 tablet (0.4 mg total) under the tongue every 5 (five) minutes x 3 doses as needed for chest pain (if no relief after 3rd dose proceed to ED or call 911). 11/06/2022-New 12/09/23   Miriam Norris, NP  OXYGEN  Inhale 2 L into the lungs at bedtime.    [provider]  pantoprazole  (PROTONIX ) 40 MG tablet Take 40 mg by mouth daily. 12/21/19   [provider]  polyethylene glycol (MIRALAX  / GLYCOLAX ) 17 g packet Take 17 g by mouth 2 (two) times daily. 10/29/23   Hongalgi, Anand D, MD  potassium chloride  SA (KLOR-CON  M) 20 MEQ tablet Take 2 tablets (40 mEq total) by mouth daily. 01/06/24   Milford, Harlene HERO, FNP  promethazine  (PHENERGAN ) 25 MG tablet Take 1 tablet (25 mg total) by mouth every 6 (six) hours as needed for nausea or vomiting. 10/15/23  Francesca Elsie CROME, MD  torsemide  (DEMADEX ) 20 MG tablet Take 4 tablets (80 mg total) by mouth 2 (two) times daily. 01/03/24   Glena Harlene HERO, FNP    Allergies: Trental  Elysia.Em ] and Vibramycin [doxycycline]    Review of Systems  Respiratory:  Positive for shortness of breath.     Updated Vital Signs BP (!) 104/58   Pulse 97   Temp 97.9 F (36.6 C) (Oral)   Resp (!) 24   SpO2 100%   Physical Exam Constitutional:      Appearance: She is well-developed.  HENT:     Head: Normocephalic and atraumatic.  Eyes:     Extraocular Movements: Extraocular  movements intact.     Pupils: Pupils are equal, round, and reactive to light.  Neck:     Vascular: JVD present.  Cardiovascular:     Rate and Rhythm: Normal rate and regular rhythm.     Pulses: Normal pulses.     Heart sounds: Murmur heard.     Comments: +murmur Pulmonary:     Effort: Pulmonary effort is normal.     Comments: Diffuse crackles bilateral lung field On 2 L [place] Abdominal:     General: Abdomen is flat.     Palpations: Abdomen is soft.  Musculoskeletal:        General: Normal range of motion.     Cervical back: Normal range of motion.  Skin:    General: Skin is warm and dry.     Capillary Refill: Capillary refill takes 2 to 3 seconds.  Neurological:     General: No focal deficit present.     Mental Status: She is alert and oriented to person, place, and time.     (all labs ordered are listed, but only abnormal results are displayed) Labs Reviewed  BASIC METABOLIC PANEL WITH GFR - Abnormal; Notable for the following components:      Result Value   CO2 19 (*)    Glucose, Bld 185 (*)    BUN 99 (*)    Creatinine, Ser 2.49 (*)    GFR, Estimated 22 (*)    All other components within normal limits  CBC - Abnormal; Notable for the following components:   RBC 2.86 (*)    Hemoglobin 8.2 (*)    HCT 26.4 (*)    All other components within normal limits  BRAIN NATRIURETIC PEPTIDE - Abnormal; Notable for the following components:   B Natriuretic Peptide 1,011.9 (*)    All other components within normal limits  TROPONIN I (HIGH SENSITIVITY) - Abnormal; Notable for the following components:   Troponin I (High Sensitivity) 519 (*)    All other components within normal limits  TROPONIN I (HIGH SENSITIVITY) - Abnormal; Notable for the following components:   Troponin I (High Sensitivity) 547 (*)    All other components within normal limits  MAGNESIUM   I-STAT CG4 LACTIC ACID, ED    EKG: EKG Interpretation Date/Time:  Monday January 20 2024 15:02:41 EDT Ventricular  Rate:  96 PR Interval:  184 QRS Duration:  110 QT Interval:  384 QTC Calculation: 485 R Axis:   120  Text Interpretation: Sinus rhythm with frequent Premature ventricular complexes Left posterior fascicular block Left ventricular hypertrophy with repolarization abnormality ( Cornell product ) Possible Inferior infarct , age undetermined Anterior infarct , age undetermined Abnormal ECG When compared with ECG of 03-Jan-2024 14:42, No significant change since last tracing Confirmed by Randol Simmonds [phone]) on 01/20/2024 6:43:37 PM  Radiology: [facility] Chest  2 View Result Date: 01/20/2024 CLINICAL DATA:  Shortness of breath. EXAM: CHEST - 2 VIEW COMPARISON:  12/15/2023 and CT chest 08/18/2022. FINDINGS: Trachea is midline. Heart size is grossly stable. Diffuse airspace opacification with bilateral pleural effusions. IMPRESSION: Congestive heart failure. Electronically Signed   By: Newell Eke M.D.   On: 01/20/2024 16:09     Procedures   Medications Ordered in the ED  furosemide  (LASIX ) 160 mg in dextrose  5 % 50 mL IVPB (160 mg Intravenous New Bag/Given 01/20/24 1927)    Clinical Course as of 01/20/24 1957  Mon Jan 20, 2024  1836 AKI with creatinine 2.49/BUN 99/GFR 22 with creatinine 1.973 days prior. [WB]    Clinical Course User Index [WB] Lui Bellis, Elsie, MD                                 Medical Decision Making  58 year old female PMH as above presenting to the ED with worsening SOB x 3 days with concern for decompensated heart failure in setting of recent right heart catheterization 09/19.  Patient denying any infectious symptoms.  No chest pain.  Of note, patient on baseline nocturnal 2 L [place], reports she has been on 2 L continuous oxygen  since Saturday, 09/20.  Upon arrival, patient hemodynamically stable.  Normotensive.  Afebrile.  No tachycardia or tachypnea.  Saturating 97% on 2 L [place].  Patient clinically volume overloaded with significant JVD and coarse/crackle lung sounds diffusely.   CXR indicative of significant volume overload with diffuse airspace opacification/pulmonary edema.  No lower extremity edema.  Patient notably on torsemide  80 mg twice daily.  Patient given 160 Lasix  IV.  Concern for acute decompensated heart failure without cardiogenic shock, as lactate 1.1.  EKG reviewed indicative of normal sinus rhythm 96 bpm with frequent PVCs.  LPFB noted.  LVH.  No STEMI.  Labs significant for elevated biomarkers with BNP 1012 and troponin 519.  BMP indicative of AKI with creatinine 2.49, elevated from 1.97 ~3 days prior.   Cardiology team consulted, recommending admission with heart failure team following.  Discussed with hospitalist service, patient to be admitted for ongoing medical management.   Final diagnoses:  AKI (acute kidney injury)  Acute decompensated heart failure [facility])    ED Discharge Orders     None          Alicya Bena, Elsie, MD 01/20/24 LEANOR    Randol Simmonds, MD 01/20/24 2008

## 2024-01-20 NOTE — Progress Notes (Signed)
 ReDS Vest / Clip - 01/20/24 1400       ReDS Vest / Clip   Station Marker A    Ruler Value 28.5    ReDS Value Range High volume overload    ReDS Actual Value 70         ]

## 2024-01-20 NOTE — Progress Notes (Addendum)
 ADVANCED [facility] CLINIC PROGRESS NOTE   PCP: [name], NP Primary Cardiologist: Diannah SHAUNNA Maywood, MD HF Cardiologist: Dr. Cherrie  Reason for visit: f/u for chronic systolic heart failure   HPI: 58 y.o. 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    Admitted 4/24 with NSTEMI. Cath showed 3V CAD and significant MR. CABG and MVR recommended, but surgery deferred d/t multiple medical issues.   Admitted 6/25 for MRSA bacteremia and necrotizing fasciitis. Required L BKA followed by revision. TEE 6/25 with EF 60-65%, mild to moderate MR and moderate to severe AS, but no evidence of endocarditis. Four week course of IV abx with daptomycin  recommended. Discharged 10/29/23 to [facility] for rehab.   Transferred back to [facility] 7/25 with a/c HF, and concern for low-output with acute respiratory failure. She was diuresed with IV lasix , but SCr continued to rise. Arranged for RHC but case aborted d/t significant dyspnea w/ hypoxia and inability to lie flat. She was diuresed and ultimately required intubation for flash pulmonary edema, and started on DBA gtt. Renal function continued to decline and she transiently required CRRT. Eventually underwent RHC showing severely elevated PCWP, mildly elevated RAP, preserved CO. No ischemic eval due to AKI. Course complicated by hematuria, requiring PRBCs. She went to OR for cystoscopy and declotting of bladder. She was able to be extubated, drips weaned and GDMT titrated, however limited by soft BP and creatinine. She was discharged home, weight 184 lbs.  She was referred to structural heart clinic previous visit. Evaluated by Dr. Wendel who ultimately referred her for repeat [facility] for TAVR w/u. Study done 01/17/24 demonstrated multivessel disease relatively unchanged versus prior study with occluded left circumflex, high-grade mid LAD, and moderate right coronary artery disease. RHC RA 8,  PAP 53/29 (40), PCWP 29 w/ v waves to 44 mmHg, CO 6.7 L/min and Fick cardiac index of 3.6 L/min/m, PVR 1.6 WU, PAPi 3.   Today she returns for HF follow up with her mother. She reports worsening dyspnea over the weekend. Had to increase her supp O2 from 2>>3L/min. She reports full compliance w/ torsemide .   ReDs markedly elevated at 57 today. Crackles noted at bases bilaterally. O2 sats 93% on 3L. BP 120/76 on midodrine .   She has completed 2 wk Zio for PVC quantification. She just mailed this back in 3 days ago. No results yet.    Cardiac Studies  - RHC (8/25): RA 8, PA 60/25 (40), PCWP mean 29 with prominent v waves to 43, CO/CI (Fick) 5.28/2.77, PVR 2.1 WU  - Ltd echo (7/25): EF 25-30%, G2DD, mild to moderate MR, moderate to severe AS  - Echo (7/25): EF 25%, RV normal, mild to moderate MR, mild AS  - TEE (6/25): LVEF 60-65%, RV normal, no thrombus, moderate to severe AS (mean gradient 27 mmHg, AVA per VTI 1.07 cm)  - R/LHC (4/24): severe 3v CAD, severe MR; RA 9, PA 42/24 (32), PCWP 24 with v waves to 45, CO/CI (Fick) 8.58/4.26  Past Medical History:  Diagnosis Date   Anemia    Aortic stenosis    Arthritis    CHF (congestive heart failure) (HCC)    Coronary artery disease    Diabetes mellitus without complication (HCC)    type 2   GERD (gastroesophageal reflux disease)    Heart murmur    History of blood transfusion    Hypercholesteremia    Hypertension  Hypothyroidism    Mitral regurgitation    Myocardial infarction [facility])    Peripheral vascular disease    PONV (postoperative nausea and vomiting)    Thyroid  disease    Current Outpatient Medications  Medication Sig Dispense Refill   acetaminophen  (TYLENOL ) 500 MG tablet Take 1,000 mg by mouth 2 (two) times daily as needed for moderate pain (pain score 4-6), headache or fever.     Alcohol Swabs  (PHARMACIST CHOICE ALCOHOL) PADS Apply topically.     aspirin  EC 81 MG tablet Take 1 tablet (81 mg total) by mouth daily. 30  tablet 3   atorvastatin  (LIPITOR) 40 MG tablet Take 40 mg by mouth daily.     clopidogrel  (PLAVIX ) 75 MG tablet Take 1 tablet (75 mg total) by mouth daily. 90 tablet 3   diphenhydrAMINE  HCl, Sleep, (ZZZQUIL) 25 MG CAPS Take 25 mg by mouth at bedtime.     ferrous sulfate  325 (65 FE) MG EC tablet Take 1 tablet (325 mg total) by mouth in the morning and at bedtime. 60 tablet 0   gabapentin  (NEURONTIN ) 300 MG capsule Take 300 mg by mouth at bedtime.     insulin  degludec (TRESIBA  FLEXTOUCH) 100 UNIT/ML FlexTouch Pen Inject 6 Units into the skin at bedtime.     levothyroxine  (SYNTHROID ) 150 MCG tablet Take 150 mcg by mouth daily before breakfast.     midodrine  (PROAMATINE ) 5 MG tablet Take 1 tablet (5 mg total) by mouth 3 (three) times daily with meals. 90 tablet 11   nitroGLYCERIN  (NITROSTAT ) 0.4 MG SL tablet Place 1 tablet (0.4 mg total) under the tongue every 5 (five) minutes x 3 doses as needed for chest pain (if no relief after 3rd dose proceed to ED or call 911). 11/06/2022-New 25 tablet 3   OXYGEN  Inhale 2 L into the lungs at bedtime.     pantoprazole  (PROTONIX ) 40 MG tablet Take 40 mg by mouth daily.     polyethylene glycol (MIRALAX  / GLYCOLAX ) 17 g packet Take 17 g by mouth 2 (two) times daily.     potassium chloride  SA (KLOR-CON  M) 20 MEQ tablet Take 2 tablets (40 mEq total) by mouth daily. 60 tablet 6   promethazine  (PHENERGAN ) 25 MG tablet Take 1 tablet (25 mg total) by mouth every 6 (six) hours as needed for nausea or vomiting. 20 tablet 0   torsemide  (DEMADEX ) 20 MG tablet Take 4 tablets (80 mg total) by mouth 2 (two) times daily. 240 tablet 0   No current facility-administered medications for this encounter.   Allergies  Allergen Reactions   Trental  [Pentoxifylline ] Nausea And Vomiting   Vibramycin [Doxycycline] Nausea And Vomiting   Social History   Socioeconomic History   Marital status: Divorced    Spouse name: Not on file   Number of children: 0   Years of education: Not on  file   Highest education level: Not on file  Occupational History   Not on file  Tobacco Use   Smoking status: Former    Current packs/day: 0.00    Types: Cigarettes    Quit date: 01/2017    Years since quitting: 6.9   Smokeless tobacco: Never  Vaping Use   Vaping status: Never Used  Substance and Sexual Activity   Alcohol use: Not Currently   Drug use: Yes    Types: Marijuana    Comment: on ocassion- Last time - 11/13/22   Sexual activity: Yes    Birth control/protection: Post-menopausal  Other Topics Concern  Not on file  Social History Narrative   Not on file   Social Drivers of Health   Financial Resource Strain: Low Risk  (10/19/2023)   Received from [facility]   Overall Financial Resource Strain (CARDIA)    How hard is it for you to pay for the very basics like food, housing, medical care, and heating?: Not hard at all  Food Insecurity: No Food Insecurity (12/15/2023)   Hunger Vital Sign    Worried About Running Out of Food in the Last Year: Never true    Ran Out of Food in the Last Year: Never true  Transportation Needs: No Transportation Needs (12/15/2023)   PRAPARE - Administrator, Civil Service (Medical): No    Lack of Transportation (Non-Medical): No  Physical Activity: Not on file  Stress: Not on file  Social Connections: Unknown (12/15/2023)   Social Connection and Isolation Panel    Frequency of Communication with Friends and Family: Three times a week    Frequency of Social Gatherings with Friends and Family: Three times a week    Attends Religious Services: Not on file    Active Member of Clubs or Organizations: Not on file    Attends Club or Organization Meetings: Not on file    Marital Status: Not on file  Intimate Partner Violence: Not At Risk (12/15/2023)   Humiliation, Afraid, Rape, and Kick questionnaire    Fear of Current or Ex-Partner: No    Emotionally Abused: No    Physically Abused: No    Sexually Abused: No   Family  History  Problem Relation Age of Onset   Diabetes Other    CAD Other     Wt Readings from Last 3 Encounters:  01/17/24 86.2 kg (190 lb)  01/03/24 86.2 kg (190 lb)  12/24/23 86.2 kg (190 lb)   BP 120/76   Pulse (!) 102   SpO2 93% Comment: 2-liters of room oxygen .  PHYSICAL EXAM: ReDs 57%, abnormal  GENERAL: NAD Lungs- bibasilar crackles CARDIAC:  JVP 8 cm          Normal rate with regular rhythm. 3/6 AS murmur, 1+ RLE  ABDOMEN: obese, soft, non-tender, non-distended.  EXTREMITIES: s/p LBKA, RLE w/ 1+ LEE  NEUROLOGIC: No obvious FND    ECG not performed   ReDs reading: 57%, abnormal  ASSESSMENT & PLAN: Acute on Chronic Systolic Heart Failure - TEE 6/25: EF 60-65%, RV okay, mild to mod MR, moderate-severe AS with mG 27 mmHg and AVA 1.07 cm2 - Admitted for mixed shock (CGS and septic) - Echo 11/15/23: EF 25% - RHC (7/25) aborted 2/2 flash pulmonary edema  - Repeat echo with EF 25-30%, G2DD, mild to mod MR, mod-severe AS  - RHC (8/25): severely elevated PCWP, mildly elevated RAP, preserved CO.  - RHC (9/25): severely elevated PCWP 29 mmHg with V waves to 44 mmHg , RA 8, CI 3.6, PAPi 3 - Currently WC Bound w/ increased dyspnea and supp O2 requirements over the weekend, NYHA IIIb. RHC 3 days ago showed RA 8, PAP 53/29 (40), PCWP 29 w/ v waves to 44 mmHg, CO 6.7 L/min and Fick cardiac index of 3.6 L/min/m, PVR 1.6 WU, PAPi 3. ReDs elevated at 57 today. Personally discussed case w/ Dr. Zenaida. He has recommended she go to the ED for admission for IV Lasix . We considered pushing outpatient diuretics but diuresis would be difficult given her renal fx and low BP. Also discussed options w/ patient and she  has agreered to go to ED. Recommend starting w/ 80 mg IV BID. Follow BMP daily  - HF GDMT limited by renal fx and hypotension requiring midodrine  support - Continue midodrine  5 mg tid - No SGLT2i with UTI   2. CAD - NSTEMI in 4/24. Multivessel CAD. CABG had been recommended but  managed medically due to other active medical issues - Repeat LHC done 9/25 for TAVR w/u. CAD stable from prior study. Planning continued medical management given stable disease and lack of anginal symptoms. - denies CP  - Continue DAPT w/ ASA + Plavix  + atorvastatin  40 mg daily   3. Valvular heart disease - Mild to moderate MR and moderate to severe AS on echo 6/25 - Repeat echo 7/25 with extremely poor windows, based on exam expect AS moderate-severe - Echo 11/20/23 with Vmax 3.21, DVT 0.26, mod to severe AS, mG 22 - Structural Heart Team following for potential TAVR.    4. MRSA bacteremia - Necrotizing fasciitis - S/p recent left BKA 6/25; S/p R TMA in 4/24   5. PAD - hx SFA stents - On aspirin /plavix  + statin.    6. Chronic Anemia - s/p 1 u RBC 11/11/23 - Hx GI bleed from AVMs.  - denies any recent gross bleeding    7. CKD IIIa - recent AKIs in setting of volume overload/low-output>>Started on CVVH 11/16/23, stopped 11/19/23, followed by recurrent AKI 2/2 bladder outlet obstruction from extensive clots in bladder.  S/p cystoscopy and clot removal - most recent SCr baseline 1.6-1.9 - Check BMP today and follow daily w/ IV lasix   - no SGLT2i given h/o UTIs   8. PVCs - Frequent on previous EKGs. Has been asymptomatic  - just completed 2 week Zio to quantify. Results pending  - If high burden, will recommend sleep study for OSA evaluation   Per above, sending to ED for admission for IV Lasix  for a/c systolic heart failure.     Caffie Shed, PA-C  01/20/24

## 2024-01-20 NOTE — ED Notes (Signed)
 Pt wheelchaired to restroom. Transferred to restroom, tolerating well, less SOB compared to last transfer to the restroom.

## 2024-01-20 NOTE — ED Triage Notes (Signed)
 Pt here for [facility] since Friday. Hx of CHF. No CP. Pt states increased O2 2-3L at all times. Pt compliant with fluid pill. Axox4.

## 2024-01-20 NOTE — ED Notes (Signed)
 Unsuccuessfull IV start

## 2024-01-20 NOTE — ED Provider Triage Note (Signed)
 Emergency Medicine Provider Triage Evaluation Note  Kayla Schmitt , a 58 y.o. female  was evaluated in triage.  Pt complains of worsening shortness of breath over the past 3 days since a cardiac catheterization.  Patient has had a history of CHF, she takes diuretic.  She had to hold this for 24 hours but then resumed.  Urinating normally.  She is on Plavix  and aspirin .  No chest pain, fevers or cough.  She has supplemental oxygen  for use at night, but has needed to use this during the day.  Transferring has become very difficult due to shortness of breath.  Review of Systems  Positive: Shortness of breath, trouble breathing Negative: Chest pain  Physical Exam  BP 109/61 (BP Location: Right Arm)   Pulse 98   Temp 98.2 F (36.8 C) (Oral)   Resp 18   SpO2 97%  Gen:   Awake, no distress   Resp:  Normal effort  MSK:   Moves extremities without difficulty  Other:  Systolic heart murmur  Medical Decision Making  Medically screening exam initiated at 3:13 PM.  Appropriate orders placed.  Kayla Schmitt was informed that the remainder of the evaluation will be completed by another provider, this initial triage assessment does not replace that evaluation, and the importance of remaining in the ED until their evaluation is complete.     Desiderio Chew, PA-C 01/20/24 1514

## 2024-01-20 NOTE — ED Notes (Signed)
 IV team at bedside

## 2024-01-20 NOTE — Patient Instructions (Signed)
Thank you for coming in today  If you had labs drawn today, any labs that are abnormal the clinic will call you No news is good news  Medications:   Follow up appointments:  Your physician recommends that you schedule a follow-up appointment in:     Do the following things EVERYDAY: Weigh yourself in the morning before breakfast. Write it down and keep it in a log. Take your medicines as prescribed Eat low salt foods--Limit salt (sodium) to 2000 mg per day.  Stay as active as you can everyday Limit all fluids for the day to less than 2 liters   At the [facility], you and your health needs are our priority. As part of our continuing mission to provide you with exceptional heart care, we have created designated Provider Care Teams. These Care Teams include your primary Cardiologist (physician) and Advanced Practice Providers (APPs- Physician Assistants and Nurse Practitioners) who all work together to provide you with the care you need, when you need it.   You may see any of the following providers on your designated Care Team at your next follow up: Dr Arvilla Meres Dr Marca Ancona Dr. Marcos Eke, NP Robbie Lis, [place] [facility] [place], [place] Brynda Peon, NP Karle Plumber, PharmD   Please be sure to bring in all your medications bottles to every appointment.    Thank you for choosing [facility]  If you have any questions or concerns before your next appointment please send [place] a message through [place] or call our office at [phone].    TO LEAVE A MESSAGE FOR THE NURSE SELECT OPTION 2, PLEASE LEAVE A MESSAGE INCLUDING: YOUR NAME DATE OF BIRTH CALL BACK NUMBER REASON FOR CALL**this is important as we prioritize the call backs  YOU WILL RECEIVE A CALL BACK THE SAME DAY AS LONG AS YOU CALL BEFORE 4:00 PM

## 2024-01-20 NOTE — ED Notes (Signed)
 Pt wheelchaired to restroom. Slight SOB with exertion, pt is back on the monitor and urine collected at bedside.

## 2024-01-21 ENCOUNTER — Inpatient Hospital Stay (HOSPITAL_COMMUNITY)

## 2024-01-21 ENCOUNTER — Encounter (HOSPITAL_COMMUNITY): Payer: Self-pay | Admitting: *Deleted

## 2024-01-21 DIAGNOSIS — R7989 Other specified abnormal findings of blood chemistry: Secondary | ICD-10-CM | POA: Diagnosis not present

## 2024-01-21 DIAGNOSIS — N1831 Chronic kidney disease, stage 3a: Secondary | ICD-10-CM | POA: Diagnosis not present

## 2024-01-21 DIAGNOSIS — I2510 Atherosclerotic heart disease of native coronary artery without angina pectoris: Secondary | ICD-10-CM | POA: Diagnosis not present

## 2024-01-21 DIAGNOSIS — E039 Hypothyroidism, unspecified: Secondary | ICD-10-CM

## 2024-01-21 DIAGNOSIS — Z89512 Acquired absence of left leg below knee: Secondary | ICD-10-CM

## 2024-01-21 DIAGNOSIS — I5023 Acute on chronic systolic (congestive) heart failure: Secondary | ICD-10-CM

## 2024-01-21 DIAGNOSIS — E1169 Type 2 diabetes mellitus with other specified complication: Secondary | ICD-10-CM

## 2024-01-21 DIAGNOSIS — I10 Essential (primary) hypertension: Secondary | ICD-10-CM

## 2024-01-21 DIAGNOSIS — K219 Gastro-esophageal reflux disease without esophagitis: Secondary | ICD-10-CM

## 2024-01-21 DIAGNOSIS — E66811 Obesity, class 1: Secondary | ICD-10-CM

## 2024-01-21 DIAGNOSIS — I739 Peripheral vascular disease, unspecified: Secondary | ICD-10-CM

## 2024-01-21 DIAGNOSIS — D509 Iron deficiency anemia, unspecified: Secondary | ICD-10-CM

## 2024-01-21 DIAGNOSIS — E785 Hyperlipidemia, unspecified: Secondary | ICD-10-CM

## 2024-01-21 LAB — BASIC METABOLIC PANEL WITH GFR
Anion gap: 14 (ref 5–15)
BUN: 102 mg/dL — ABNORMAL HIGH (ref 6–20)
CO2: 23 mmol/L (ref 22–32)
Calcium: 9.3 mg/dL (ref 8.9–10.3)
Chloride: 100 mmol/L (ref 98–111)
Creatinine, Ser: 2.42 mg/dL — ABNORMAL HIGH (ref 0.44–1.00)
GFR, Estimated: 23 mL/min — ABNORMAL LOW (ref >60)
Glucose, Bld: 171 mg/dL — ABNORMAL HIGH (ref 70–99)
Potassium: 3.8 mmol/L (ref 3.5–5.1)
Sodium: 137 mmol/L (ref 135–145)

## 2024-01-21 LAB — ECHOCARDIOGRAM LIMITED
Est EF: 25
Height: 64 in
S' Lateral: 4.55 cm
Weight: 3065.28 [oz_av]

## 2024-01-21 LAB — CBC
HCT: 22.2 % — ABNORMAL LOW (ref 36.0–46.0)
Hemoglobin: 7 g/dL — ABNORMAL LOW (ref 12.0–15.0)
MCH: 28.7 pg (ref 26.0–34.0)
MCHC: 31.5 g/dL (ref 30.0–36.0)
MCV: 91 fL (ref 80.0–100.0)
Platelets: 258 K/uL (ref 150–400)
RBC: 2.44 MIL/uL — ABNORMAL LOW (ref 3.87–5.11)
RDW: 14.3 % (ref 11.5–15.5)
WBC: 6.1 K/uL (ref 4.0–10.5)
nRBC: 0 % (ref 0.0–0.2)

## 2024-01-21 LAB — GLUCOSE, CAPILLARY
Glucose-Capillary: 176 mg/dL — ABNORMAL HIGH (ref 70–99)
Glucose-Capillary: 215 mg/dL — ABNORMAL HIGH (ref 70–99)
Glucose-Capillary: 173 mg/dL — ABNORMAL HIGH (ref 70–99)
Glucose-Capillary: 164 mg/dL — ABNORMAL HIGH (ref 70–99)

## 2024-01-21 LAB — TROPONIN I (HIGH SENSITIVITY)
Troponin I (High Sensitivity): 504 ng/L (ref <18)
Troponin I (High Sensitivity): 407 ng/L (ref <18)

## 2024-01-21 LAB — CBG MONITORING, ED: Glucose-Capillary: 177 mg/dL — ABNORMAL HIGH (ref 70–99)

## 2024-01-21 LAB — MAGNESIUM: Magnesium: 2.3 mg/dL (ref 1.7–2.4)

## 2024-01-21 LAB — PHOSPHORUS: Phosphorus: 6.2 mg/dL — ABNORMAL HIGH (ref 2.5–4.6)

## 2024-01-21 MED ORDER — GABAPENTIN 300 MG PO CAPS
300.0000 mg | ORAL_CAPSULE | Freq: Every day | ORAL | Status: DC
  Administered 2024-01-21 – 2024-01-23 (×4): 300 mg via ORAL
  Filled 2024-01-21 (×4): qty 1

## 2024-01-21 MED ORDER — LEVOTHYROXINE SODIUM 75 MCG PO TABS
150.0000 ug | ORAL_TABLET | Freq: Every day | ORAL | Status: DC
  Administered 2024-01-21 – 2024-01-24 (×4): 150 ug via ORAL
  Filled 2024-01-21 (×4): qty 2

## 2024-01-21 MED ORDER — FUROSEMIDE 10 MG/ML IJ SOLN
160.0000 mg | Freq: Two times a day (BID) | INTRAVENOUS | Status: DC
  Administered 2024-01-21 – 2024-01-24 (×7): 160 mg via INTRAVENOUS
  Filled 2024-01-21 (×3): qty 16
  Filled 2024-01-21: qty 10
  Filled 2024-01-21: qty 16
  Filled 2024-01-21 (×2): qty 10
  Filled 2024-01-21: qty 16
  Filled 2024-01-21: qty 10

## 2024-01-21 MED ORDER — ASPIRIN 81 MG PO TBEC
81.0000 mg | DELAYED_RELEASE_TABLET | Freq: Every day | ORAL | Status: DC
  Administered 2024-01-21 – 2024-01-24 (×4): 81 mg via ORAL
  Filled 2024-01-21 (×4): qty 1

## 2024-01-21 MED ORDER — PANTOPRAZOLE SODIUM 40 MG PO TBEC
40.0000 mg | DELAYED_RELEASE_TABLET | Freq: Every day | ORAL | Status: DC
  Administered 2024-01-21 – 2024-01-24 (×4): 40 mg via ORAL
  Filled 2024-01-21 (×4): qty 1

## 2024-01-21 MED ORDER — PERFLUTREN LIPID MICROSPHERE
1.0000 mL | INTRAVENOUS | Status: AC | PRN
  Administered 2024-01-21: 4 mL via INTRAVENOUS

## 2024-01-21 MED ORDER — FERROUS SULFATE 325 (65 FE) MG PO TABS
325.0000 mg | ORAL_TABLET | Freq: Every day | ORAL | Status: DC
  Administered 2024-01-21 – 2024-01-24 (×4): 325 mg via ORAL
  Filled 2024-01-21 (×4): qty 1

## 2024-01-21 MED ORDER — ATORVASTATIN CALCIUM 40 MG PO TABS
40.0000 mg | ORAL_TABLET | Freq: Every day | ORAL | Status: DC
  Administered 2024-01-21 – 2024-01-24 (×4): 40 mg via ORAL
  Filled 2024-01-21 (×4): qty 1

## 2024-01-21 MED ORDER — CLOPIDOGREL BISULFATE 75 MG PO TABS
75.0000 mg | ORAL_TABLET | Freq: Every day | ORAL | Status: DC
  Administered 2024-01-21 – 2024-01-24 (×4): 75 mg via ORAL
  Filled 2024-01-21 (×4): qty 1

## 2024-01-21 NOTE — Assessment & Plan Note (Signed)
Calculated BMI is 32.8

## 2024-01-21 NOTE — Assessment & Plan Note (Signed)
 Continue blood pressure support with midodrine.

## 2024-01-21 NOTE — Assessment & Plan Note (Signed)
 Follow up as outpatient

## 2024-01-21 NOTE — Assessment & Plan Note (Addendum)
 Cell count with hgb at 7,0 Will follow up cell count in am, if continue to be 7 or lower may need PRBC transfusion.  Continue oral supplementation of  Iron 

## 2024-01-21 NOTE — Assessment & Plan Note (Signed)
 Patient was placed on insulin sliding scale for glucose cover and monitoring during her hospitalization.  Continue statin.

## 2024-01-21 NOTE — Assessment & Plan Note (Addendum)
 High sensitive troponin elevation due to heart failure exacerbation with volume overload.  No acute coronary syndrome.  Continue aspirin , clopidogrel  and statin therapy

## 2024-01-21 NOTE — Progress Notes (Signed)
 PROGRESS NOTE    Kayla Schmitt  FMW:[id number] DOB: 1965-12-01 DOA: 01/20/2024 PCP: [name], NP  58/F w acute on chronic systolic CHF, PAD sp B/L TMA, 3V CAD, type 2 diabetes, hypertension, hyperlipidemia, chronic anemia secondary to GI bleeds from AVMs admitted from heart failure clinic with severe volume overload. Workup in the ER significant for creatinine of 2.5, chest x-ray with diffuse bilateral pulmonary edema/pleural effusions and EKG with normal sinus rhythm with frequent PVCs.   Subjective:  Assessment and Plan:  Acute on chronic systolic CHF -Echo with EF 25%, moderate mitral regurgitation, positive aortic stenosis.  -continue diuresis with furosemide  160 mg IV bid.  -continue midodrine  Limited medical therapy due to risk of hypotension.   Mod to severe AS -structural heart team foll  3V CAD (coronary artery disease) PAD High sensitive troponin elevation due to heart failure exacerbation with volume overload. CABG has been recommended but now on med mgt due to ongoing med issues Continue aspirin , clopidogrel  and statin therapy  Chronic kidney disease, stage 3a (HCC) - diuresis with furosemide  IV and monitor  Type 2 diabetes mellitus with hyperlipidemia (HCC) Continue glucose cover and monitoring with insulin  sliding scale.  Continue statin   S/P BKA (below knee amputation) unilateral, left (HCC) Follow up as outpatient  PVD (peripheral vascular disease) Continue dual antiplatelet therapy and statin   GERD (gastroesophageal reflux disease) Continue with pantoprazole   Iron  deficiency anemia Known AVMs Cell count with hgb at 7,0 Will follow up cell count in am, if continue to be 7 or lower may need PRBC transfusion.  Continue oral supplementation of  Iron    Hypothyroidism Continue with levothyroxine    Obesity, class 1 Calculated BMI is 32.8    DVT prophylaxis: hep SQ Code Status: Full code Family Communication: Disposition Plan:    Consultants:    Procedures:   Antimicrobials:    Objective: Vitals:   01/21/24 0103 01/21/24 0113 01/21/24 0800 01/21/24 1616  BP:  106/82 102/69 (!) 109/53  Pulse:  68 96 91  Resp:  20 20 19   Temp: 97.9 F (36.6 C)   97.9 F (36.6 C)  TempSrc: Oral  Oral Oral  SpO2:  100%    Weight: 86.9 kg     Height: 5' 4 (1.626 m)       Intake/Output Summary (Last 24 hours) at 01/21/2024 1732 Last data filed at 01/21/2024 1730 Gross per 24 hour  Intake 290 ml  Output 1250 ml  Net -960 ml   Filed Weights   01/21/24 0103  Weight: 86.9 kg    Examination:      Data Reviewed:   CBC: Recent Labs  Lab 01/17/24 1123 01/17/24 1129 01/17/24 1131 01/20/24 1501 01/21/24 0659  WBC  --   --   --  10.0 6.1  HGB 8.5* 7.5* 7.5* 8.2* 7.0*  HCT 25.0* 22.0* 22.0* 26.4* 22.2*  MCV  --   --   --  92.3 91.0  PLT  --   --   --  268 258   Basic Metabolic Panel: Recent Labs  Lab 01/17/24 0806 01/17/24 1123 01/17/24 1129 01/17/24 1131 01/20/24 1501 01/21/24 0659  NA 137 141 143 143 136 137  K 4.5 3.9 3.6 3.7 4.1 3.8  CL 111  --   --   --  102 100  CO2 15*  --   --   --  19* 23  GLUCOSE 136*  --   --   --  185* 171*  BUN  23*  --   --   --  99* 102*  CREATININE 1.97*  --   --   --  2.49* 2.42*  CALCIUM  9.1  --   --   --  9.1 9.3  MG  --   --   --   --   --  2.3  PHOS  --   --   --   --   --  6.2*   GFR: Estimated Creatinine Clearance: 27.4 mL/min (A) (by C-G formula based on SCr of 2.42 mg/dL (H)). Liver Function Tests: No results for input(s): AST, ALT, ALKPHOS, BILITOT, PROT, ALBUMIN  in the last 168 hours. No results for input(s): LIPASE, AMYLASE in the last 168 hours. No results for input(s): AMMONIA in the last 168 hours. Coagulation Profile: No results for input(s): INR, PROTIME in the last 168 hours. Cardiac Enzymes: No results for input(s): CKTOTAL, CKMB, CKMBINDEX, TROPONINI in the last 168 hours. BNP (last 3 results) No results  for input(s): PROBNP in the last 8760 hours. HbA1C: No results for input(s): HGBA1C in the last 72 hours. CBG: Recent Labs  Lab 01/17/24 0711 01/21/24 0003 01/21/24 0612 01/21/24 1127 01/21/24 1612  GLUCAP 161* 177* 176* 215* 173*   Lipid Profile: No results for input(s): CHOL, HDL, LDLCALC, TRIG, CHOLHDL, LDLDIRECT in the last 72 hours. Thyroid  Function Tests: No results for input(s): TSH, T4TOTAL, FREET4, T3FREE, THYROIDAB in the last 72 hours. Anemia Panel: No results for input(s): VITAMINB12, FOLATE, FERRITIN, TIBC, IRON , RETICCTPCT in the last 72 hours. Urine analysis:    Component Value Date/Time   COLORURINE BROWN (A) 11/26/2023 1024   APPEARANCEUR Clear 01/02/2024 0000   LABSPEC  11/26/2023 1024    TEST NOT REPORTED DUE TO COLOR INTERFERENCE OF URINE PIGMENT   PHURINE  11/26/2023 1024    TEST NOT REPORTED DUE TO COLOR INTERFERENCE OF URINE PIGMENT   GLUCOSEU Negative 01/02/2024 0000   HGBUR (A) 11/26/2023 1024    TEST NOT REPORTED DUE TO COLOR INTERFERENCE OF URINE PIGMENT   BILIRUBINUR Negative 01/02/2024 0000   KETONESUR (A) 11/26/2023 1024    TEST NOT REPORTED DUE TO COLOR INTERFERENCE OF URINE PIGMENT   PROTEINUR Negative 01/02/2024 0000   PROTEINUR (A) 11/26/2023 1024    TEST NOT REPORTED DUE TO COLOR INTERFERENCE OF URINE PIGMENT   NITRITE Negative 01/02/2024 0000   NITRITE (A) 11/26/2023 1024    TEST NOT REPORTED DUE TO COLOR INTERFERENCE OF URINE PIGMENT   LEUKOCYTESUR Trace (A) 01/02/2024 0000   LEUKOCYTESUR (A) 11/26/2023 1024    TEST NOT REPORTED DUE TO COLOR INTERFERENCE OF URINE PIGMENT   Sepsis Labs: @LABRCNTIP (procalcitonin:4,lacticidven:4)  )No results found for this or any previous visit (from the past 240 hours).   Radiology Studies: ECHOCARDIOGRAM LIMITED Result Date: 01/21/2024    ECHOCARDIOGRAM LIMITED REPORT   Patient Name:   Kayla Schmitt [facility] Date of Exam: 01/21/2024 Medical Rec #:  [id number]               Height:       64.0 in Accession #:    [id number]             Weight:       191.6 lb Date of Birth:  Feb 25, 1966               BSA:          1.921 m Patient Age:    58 years  BP:           102/69 mmHg Patient Gender: F                      HR:           95 bpm. Exam Location:  Inpatient Procedure: Limited Echo and Intracardiac Opacification Agent (Both Spectral and            Color Flow Doppler were utilized during procedure). Indications:    Elevated Troponin  History:        Patient has prior history of Echocardiogram examinations, most                 recent 11/20/2023. CHF, Previous Myocardial Infarction and CAD,                 Signs/Symptoms:Chest Pain; Risk Factors:Diabetes, Hypertension                 and Former Smoker.  Sonographer:    Juliene Rucks Referring Phys: [id number] CAROLE N HALL IMPRESSIONS  1. Left ventricular ejection fraction, by estimation, is 25%. The left ventricle has severely decreased function. The left ventricle demonstrates global hypokinesis. Left ventricular diastolic function could not be evaluated- limited study.  2. Large pleural effusion in both left and right lateral regions. There is secondary compressive atelectassis seen in some views.  3. The mitral valve is abnormally calcified. 2D MVA 2.86 cm2. Full MR assessment not performed; at least moderate mitral regurgitation. The mitral valve is abnormal. Moderate mitral valve regurgitation.  4. Aortic valve is visually calcified; Full AS or AI assesssment not performed. Mild, central aortic regurgitation based on PLAX assessment. Aortic valve regurgitation is mild. Comparison(s): Prior images reviewed side by side. Mitral regurgitation has increased. Pleural effusions are new. FINDINGS  Left Ventricle: Left ventricular ejection fraction, by estimation, is 25%. The left ventricle has severely decreased function. The left ventricle demonstrates global hypokinesis. The left ventricular internal cavity size was normal  in size. There is no left ventricular hypertrophy. Left ventricular diastolic function could not be evaluated. Left ventricular diastolic function could not be evaluated due to mitral regurgitation (moderate or greater). Mitral Valve: The mitral valve is abnormally calcified. 2D MVA 2.86 cm2. Full MR assessment not performed; at least moderate mitral regurgitation. The mitral valve is abnormal. Moderate mitral valve regurgitation. Tricuspid Valve: Tricuspid valve regurgitation is mild. Aortic Valve: Aortic valve is visually calcified; Full AS or AI assesssment not performed. Mild, central aortic regurgitation based on PLAX assessment. Aortic valve regurgitation is mild. Pulmonic Valve: Pulmonic valve regurgitation is mild. Aorta: The aortic root and ascending aorta are structurally normal, with no evidence of dilitation. Additional Comments: There is a large pleural effusion in both left and right lateral regions.  LEFT VENTRICLE PLAX 2D LVIDd:         5.42 cm LVIDs:         4.55 cm LV PW:         1.16 cm LV IVS:        1.06 cm LVOT diam:     1.78 cm LVOT Area:     2.49 cm  LEFT ATRIUM         Index LA diam:    3.81 cm 1.98 cm/m   AORTA Ao Root diam: 2.63 cm Ao Asc diam:  2.89 cm  SHUNTS Systemic Diam: 1.78 cm Stanly Leavens MD Electronically signed by Stanly Leavens MD Signature Date/Time: 01/21/2024/10:28:08 AM  Final    DG Chest 2 View Result Date: 01/20/2024 CLINICAL DATA:  Shortness of breath. EXAM: CHEST - 2 VIEW COMPARISON:  12/15/2023 and CT chest 08/18/2022. FINDINGS: Trachea is midline. Heart size is grossly stable. Diffuse airspace opacification with bilateral pleural effusions. IMPRESSION: Congestive heart failure. Electronically Signed   By: Newell Eke M.D.   On: 01/20/2024 16:09     Scheduled Meds:  aspirin  EC  81 mg Oral Daily   atorvastatin   40 mg Oral Daily   clopidogrel   75 mg Oral Daily   ferrous sulfate   325 mg Oral Q breakfast   gabapentin   300 mg Oral QHS    heparin   5,000 Units Subcutaneous Q8H   insulin  aspart  0-5 Units Subcutaneous QHS   insulin  aspart  0-9 Units Subcutaneous TID WC   levothyroxine   150 mcg Oral QAC breakfast   midodrine   5 mg Oral TID WC   pantoprazole   40 mg Oral Daily   Continuous Infusions:  furosemide  160 mg (01/21/24 1728)     LOS: 1 day    Time spent:    Sigurd Pac, MD [facility]   01/21/2024, 5:32 PM

## 2024-01-21 NOTE — Plan of Care (Signed)
  Problem: Education: Goal: Ability to describe self-care measures that may prevent or decrease complications (Diabetes Survival Skills Education) will improve Outcome: Progressing Goal: Individualized Educational Video(s) Outcome: Progressing   Problem: Education: Goal: Individualized Educational Video(s) Outcome: Progressing   Problem: Coping: Goal: Ability to adjust to condition or change in health will improve Outcome: Progressing   Problem: Fluid Volume: Goal: Ability to maintain a balanced intake and output will improve Outcome: Progressing

## 2024-01-21 NOTE — Assessment & Plan Note (Signed)
 Echocardiogram with reduced LV systolic function with EF 25%, global hypokinesis, moderate mitral regurgitation, positive aortic stenosis.   Continue volume overloaded.  Systolic blood pressure 100 mmHg systolic.   Plan to continue diuresis with furosemide  160 mg IV bid.  Blood pressure support with midodrine  Limited medical therapy due to risk of hypotension.

## 2024-01-21 NOTE — Progress Notes (Signed)
 Progress Note   Kayla Schmitt: Kayla Schmitt FMW:[id number] DOB: 05-Mar-1966 DOA: 01/20/2024     1 DOS: the Kayla Schmitt was seen and examined on 01/21/2024   Brief hospital course: Kayla Schmitt was admitted to the hospital with the working diagnosis of heart failure exacerbation.  58 yo female with the past medical history of aortic stenosis, coronary artery disease, peripheral vascular disease sp left AKA, heart failure, and hypothyroidism who presented with dyspnea. Reported 3 days of worsening dyspnea on exertion and peripheral edema. On the day of admission she was evaluated at the heart failure clinic and was recommended for Kayla Schmitt to come to the hospital for diuresis.  On her initial physical examination her blood pressure was 106/82, HR 101, RR 20 and 02 saturation 100% on 3 L/min.  Lungs with bilateral rales with no wheezing, heart with S1 and S2 present and regular, positive murmur at the base, abdomen with no distention and positive peripheral edema with right lower extremity ++ pitting edema.   Assessment and Plan: * Acute on chronic systolic (congestive) heart failure (HCC) Echocardiogram with reduced LV systolic function with EF 25%, global hypokinesis, moderate mitral regurgitation, positive aortic stenosis.   Continue volume overloaded.  Systolic blood pressure 100 mmHg systolic.   Plan to continue diuresis with furosemide  160 mg IV bid.  Blood pressure support with midodrine  Limited medical therapy due to risk of hypotension.   CAD (coronary artery disease) High sensitive troponin elevation due to heart failure exacerbation with volume overload.  No acute coronary syndrome.  Continue aspirin , clopidogrel  and statin therapy  Hypertension Continue blood pressure support with midodrine    Chronic kidney disease, stage 3a (HCC) Hyper P   Renal function with serum cr at 2,42 with K at 3,8 and serum bicarbonate at 23  Na 137 and P 6,2   Continue diuresis with furosemide   IV and follow up renal function and electrolytes in am  Type 2 diabetes mellitus with hyperlipidemia (HCC) Continue glucose cover and monitoring with insulin  sliding scale.  Continue statin   S/P BKA (below knee amputation) unilateral, left (HCC) Follow up as outpatient  PVD (peripheral vascular disease) Continue dual antiplatelet therapy and statin   GERD (gastroesophageal reflux disease) Continue with pantoprazole   Iron  deficiency anemia Cell count with hgb at 7,0 Will follow up cell count in am, if continue to be 7 or lower may need PRBC transfusion.  Continue oral supplementation of  Iron    Hypothyroidism Continue with levothyroxine    Obesity, class 1 Calculated BMI is 32.8         Subjective: Kayla Schmitt continue to have dyspnea and edema, improved but not back to her baseline.   Physical Exam: Vitals:   01/20/24 2255 01/21/24 0015 01/21/24 0103 01/21/24 0113  BP:  131/64  106/82  Pulse:  93  68  Resp:  (!) 24  20  Temp: 98.2 F (36.8 C)  97.9 F (36.6 C)   TempSrc: Oral  Oral   SpO2:  100%  100%  Weight:   86.9 kg   Height:   5' 4 (1.626 m)    Neurology awake and alert ENT with mild pallor Cardiovascular with S1 and S2 present and regular, positive systolic murmur at the base, crescendo decrescendo with no gallops or rubs No JVD Respiratory with rales at bases bilaterally with no wheezing or rhonchi  Abdomen with no distention  Positive right lower extremity edema ++ pitting   Data Reviewed:    Family Communication: I spoke with Kayla Schmitt's mother  at the bedside, we talked in detail about Kayla Schmitt's condition, plan of care and prognosis and all questions were addressed.   Disposition: Status is: Inpatient Remains inpatient appropriate because: IV diuresis   Planned Discharge Destination: Home   Author: Elidia Toribio Furnace, MD 01/21/2024 7:42 AM  For on call review [email or web address].

## 2024-01-21 NOTE — Hospital Course (Addendum)
 Kayla Schmitt was admitted to the hospital with the working diagnosis of heart failure exacerbation.  58 yo female with the past medical history of aortic stenosis, coronary artery disease, peripheral vascular disease sp left AKA, heart failure, and hypothyroidism who presented with dyspnea. Reported 3 days of worsening dyspnea on exertion and peripheral edema. On the day of admission she was evaluated at the heart failure clinic and was recommended for patient to come to the hospital for diuresis.  On her initial physical examination her blood pressure was 106/82, HR 101, RR 20 and 02 saturation 100% on 3 L/min.  Lungs with bilateral rales with no wheezing, heart with S1 and S2 present and regular, positive murmur at the base, abdomen with no distention and positive peripheral edema with right lower extremity ++ pitting edema.

## 2024-01-21 NOTE — Progress Notes (Signed)
  Echocardiogram 2D Echocardiogram has been performed.  Kayla Schmitt 01/21/2024, 8:49 AM

## 2024-01-21 NOTE — Progress Notes (Signed)
 Critical Illness Claim form completed, signed by Dr Cherrie, and faxed along with records to [facility] [phone]

## 2024-01-21 NOTE — TOC Initial Note (Signed)
 Transition of Care [facility]) - Initial/Assessment Note    Patient Details  Name: Kayla Schmitt MRN: [id number] Date of Birth: 12-01-1965  [facility] CM/SW Contact:    Justina Delcia Czar, RN Phone Number: [phone] 01/21/2024, 1:17 PM  Clinical Narrative:                  Spoke to pt and states mother lives in home. Pt independent at home. Provided pt with scale for daily weights. She is currently not working, completed her SSA disability screening today on the phone with SS office. Pt has [facility] with HF booklet.  Had [facility] in the past. States she would continue if [facility] needed. Pt does not feel HH is needed this admission.   Will continue to follow up for dc needs.     Expected Discharge Plan: Home w Home Health Services Barriers to Discharge: Continued Medical Work up   Patient Goals and CMS Choice            Expected Discharge Plan and Services   Discharge Planning Services: CM Consult   Living arrangements for the past 2 months: Single Family Home                    Prior Living Arrangements/Services Living arrangements for the past 2 months: Single Family Home   Patient language and need for interpreter reviewed:: Yes        Need for Family Participation in Patient Care: No (Comment) Care giver support system in place?: Yes (comment)   Criminal Activity/Legal Involvement Pertinent to Current Situation/Hospitalization: No - Comment as needed  Activities of Daily Living   ADL Screening (condition at time of admission) Independently performs ADLs?: No Does the patient have a NEW difficulty with bathing/dressing/toileting/self-feeding that is expected to last >3 days?: No Does the patient have a NEW difficulty with getting in/out of bed, walking, or climbing stairs that is expected to last >3 days?: Yes (Initiates electronic notice to provider for possible PT consult) Does the patient have a NEW difficulty with communication that  is expected to last >3 days?: No Is the patient deaf or have difficulty hearing?: No Does the patient have difficulty seeing, even when wearing glasses/contacts?: No Does the patient have difficulty concentrating, remembering, or making decisions?: No  Permission Sought/Granted Permission sought to share information with : Case Manager, Family Supports, PCP Permission granted to share information with : Yes, Verbal Permission Granted  Share Information with NAME: Kariss Longmire  Permission granted to share info w AGENCY: Home Health, DME, PCP  Permission granted to share info w Relationship: mother  Permission granted to share info w Contact Information: [phone]  Emotional Assessment Appearance:: Appears stated age Attitude/Demeanor/Rapport: Engaged Affect (typically observed): Accepting Orientation: : Oriented to Self, Oriented to Place, Oriented to  Time, Oriented to Situation   Psych Involvement: No (comment)  Admission diagnosis:  AKI (acute kidney injury) [N17.9] Acute decompensated heart failure (HCC) [I50.9] Acute on chronic systolic (congestive) heart failure (HCC) [I50.23] Patient Active Problem List   Diagnosis Date Noted   Acute on chronic systolic (congestive) heart failure (HCC) 12/15/2023   Chronic anemia 12/15/2023   Gross hematuria 11/30/2023   Clot retention of urine 11/30/2023   Advanced care planning/counseling discussion 11/29/2023   Goals of care, counseling/discussion 11/29/2023   Palliative care by specialist 11/29/2023   Chronic kidney disease, stage 3a (HCC) 11/23/2023   Cardiogenic shock (HCC) 11/16/2023   Acute respiratory failure  with hypoxia (HCC) 11/13/2023   Anxiety state 11/12/2023   Acute on chronic systolic CHF (congestive heart failure) (HCC) 11/12/2023   Pressure injury of skin 11/11/2023   Unilateral complete BKA, left, sequela 10/29/2023   S/P BKA (below knee amputation) unilateral, left (HCC) 10/29/2023   Necrotizing fasciitis  (HCC) 10/22/2023   Gangrene of left foot (HCC) 10/21/2023   MRSA bacteremia 10/21/2023   Sepsis (HCC) 10/20/2023   AKI (acute kidney injury) 10/20/2023   Diffuse pain in left lower extremity 10/20/2023   History of GI bleed 10/20/2023   Elevated troponin 10/20/2023   Hypokalemia 10/20/2023   History of coronary artery disease 10/20/2023   Prolonged QT interval 10/20/2023   AVM (arteriovenous malformation) of small bowel, acquired 04/07/2023   Antiplatelet or antithrombotic long-term use 04/04/2023   Iron  deficiency anemia 04/04/2023   Acute on chronic diastolic heart failure (HCC) 04/03/2023   Osteomyelitis of foot, right, acute (HCC) 12/12/2022   CAD (coronary artery disease) 11/06/2022   Aortic stenosis 11/06/2022   Mitral regurgitation 08/27/2022   NSTEMI (non-ST elevated myocardial infarction) (HCC) 08/18/2022   Acute clinical systolic heart failure (HCC) 08/18/2022   Acute blood loss anemia 08/18/2022   Anemia 08/18/2022   Critical limb ischemia of both lower extremities (HCC) 06/19/2022   Type 2 diabetes mellitus with hyperlipidemia (HCC) 01/28/2020   Hyperlipidemia 01/28/2020   Hypertension 01/28/2020   Hypothyroidism 01/28/2020   Obesity, class 1 01/28/2020   Ulcerated, foot, right, limited to breakdown of skin (HCC)    Cellulitis of right foot    PVD (peripheral vascular disease)    Cellulitis 01/11/2020   Blister of leg 01/06/2020   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/28/2017   Chest pain 10/28/2017   GERD (gastroesophageal reflux disease) 10/28/2017   PCP:  [name], NP Pharmacy:   [facility] - [place], [place] - [address] [place] [address] Phone: [phone] Fax: [phone]  [facility] [address] [place] [address] Phone: [phone] Fax: [phone]     Social Drivers of Health (SDOH) Social History: SDOH Screenings   Food Insecurity: No Food Insecurity  (01/21/2024)  Housing: Low Risk  (01/21/2024)  Transportation Needs: No Transportation Needs (01/21/2024)  Utilities: Not At Risk (01/21/2024)  Financial Resource Strain: Low Risk  (10/19/2023)   Received from [facility]  Social Connections: Unknown (12/15/2023)  Tobacco Use: Medium Risk (01/20/2024)   SDOH Interventions:     Readmission Risk Interventions    12/16/2023   12:45 PM 12/06/2023    1:45 PM 11/14/2023   10:15 AM  Readmission Risk Prevention Plan  Transportation Screening Complete Complete Complete  Medication Review Oceanographer) Complete Complete Complete  PCP or Specialist appointment within 3-5 days of discharge Not Complete Complete   HRI or Home Care Consult Complete Complete Complete  SW Recovery Care/Counseling Consult Complete    Palliative Care Screening Not Applicable Not Applicable Not Applicable  Skilled Nursing Facility Not Applicable Not Applicable Not Applicable

## 2024-01-21 NOTE — Assessment & Plan Note (Signed)
 Continue with pantoprazole 

## 2024-01-21 NOTE — ED Notes (Signed)
 Pt requested her O2 be turned up to 3L while she sleeps.

## 2024-01-21 NOTE — Consult Note (Signed)
 Advanced Heart Failure Team Consult Note   Primary Physician: [name], NP Cardiologist:  Vishnu P Mallipeddi, MD  Reason for Consultation: Heart Failure   HPI:    Kayla Schmitt is seen today for evaluation of heart failure at the request of Dr Noralee.   58 y.o. 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    Admitted 4/24 with NSTEMI. Cath showed 3V CAD and significant MR. CABG and MVR recommended, but surgery deferred d/t multiple medical issues.   Admitted 6/25 for MRSA bacteremia and necrotizing fasciitis. Required L BKA followed by revision. TEE 6/25 with EF 60-65%, mild to moderate MR and moderate to severe AS, but no evidence of endocarditis. Four week course of IV abx with daptomycin  recommended. Discharged 10/29/23 to CIR for rehab.    Transferred back to [facility] 7/25 with a/c HF, and concern for low-output with acute respiratory failure. She was diuresed with IV lasix , but SCr continued to rise. Arranged for RHC but case aborted d/t significant dyspnea w/ hypoxia and inability to lie flat. She was diuresed and ultimately required intubation for flash pulmonary edema, and started on DBA gtt. Renal function continued to decline and she transiently required CRRT. Eventually underwent RHC showing severely elevated PCWP, mildly elevated RAP, preserved CO. No ischemic eval due to AKI. Course complicated by hematuria, requiring PRBCs. She went to OR for cystoscopy and declotting of bladder. She was able to be extubated, drips weaned and GDMT titrated, however limited by soft BP and creatinine. She was discharged home, weight 184 lbs.   She was referred to structural heart clinic previous visit. Evaluated by Dr. Wendel who ultimately referred her for repeat [facility] for TAVR w/u. Study done 01/17/24 demonstrated multivessel disease relatively unchanged versus prior study with occluded left circumflex,  high-grade mid LAD, and moderate right coronary artery disease. RHC RA 8, PAP 53/29 (40), PCWP 29 w/ v waves to 44 mmHg, CO 6.7 L/min and Fick cardiac index of 3.6 L/min/m, PVR 1.6 WU, PAPi 3.   Yesterday she was seen in the HF clinic. Marked volume overload. ReDs 57%. Sent to ED and admitted with A/C HFrEF. BNP elevated. CXR - congestive heart failure. HS Trop 519>547, Creatinine 2.5, CO2 19, Hgb 8.2. Started on 160 mg IV lasix  twice a day.   Feeling better today. Breathing improved.      Home Medications Prior to Admission medications   Medication Sig Start Date End Date Taking? Authorizing Provider  aspirin  EC 81 MG tablet Take 1 tablet (81 mg total) by mouth daily. Patient taking differently: Take 81 mg by mouth at bedtime. 01/03/24  Yes Milford, Harlene HERO, FNP  atorvastatin  (LIPITOR) 40 MG tablet Take 40 mg by mouth daily. 12/14/19  Yes [provider]  clopidogrel  (PLAVIX ) 75 MG tablet Take 1 tablet (75 mg total) by mouth daily. 01/03/24  Yes Milford, Harlene HERO, FNP  diphenhydrAMINE  HCl, Sleep, (ZZZQUIL) 25 MG CAPS Take 25 mg by mouth at bedtime.   Yes [provider]  ferrous sulfate  325 (65 FE) MG EC tablet Take 1 tablet (325 mg total) by mouth in the morning and at bedtime. Patient taking differently: Take 325-650 mg by mouth in the morning and at bedtime. Take one (325mg ) or two tablets (650) once per day in the morning alternating dose strength. 12/18/23  Yes Bryn Bernardino NOVAK, MD  gabapentin  (NEURONTIN ) 300 MG capsule Take  300 mg by mouth at bedtime.   Yes [provider]  insulin  degludec (TRESIBA  FLEXTOUCH) 100 UNIT/ML FlexTouch Pen Inject 6 Units into the skin at bedtime. Patient taking differently: Inject 16 Units into the skin at bedtime. 11/27/23  Yes Arrien, Mauricio Daniel, MD  levothyroxine  (SYNTHROID ) 150 MCG tablet Take 150 mcg by mouth daily before breakfast. 12/02/19  Yes [provider]  midodrine  (PROAMATINE ) 5 MG tablet Take 1 tablet (5 mg  total) by mouth 3 (three) times daily with meals. 01/06/24  Yes Milford, Harlene HERO, FNP  nitroGLYCERIN  (NITROSTAT ) 0.4 MG SL tablet Place 1 tablet (0.4 mg total) under the tongue every 5 (five) minutes x 3 doses as needed for chest pain (if no relief after 3rd dose proceed to ED or call 911). 11/06/2022-New 12/09/23  Yes Miriam Norris, NP  OXYGEN  Inhale 2 L into the lungs continuous.   Yes [provider]  pantoprazole  (PROTONIX ) 40 MG tablet Take 40 mg by mouth daily. 12/21/19  Yes [provider]  polyethylene glycol (MIRALAX  / GLYCOLAX ) 17 g packet Take 17 g by mouth 2 (two) times daily. 10/29/23  Yes Hongalgi, Anand D, MD  potassium chloride  SA (KLOR-CON  M) 20 MEQ tablet Take 2 tablets (40 mEq total) by mouth daily. Patient taking differently: Take 20 mEq by mouth 2 (two) times daily. 01/06/24  Yes Milford, Harlene HERO, FNP  promethazine  (PHENERGAN ) 25 MG tablet Take 1 tablet (25 mg total) by mouth every 6 (six) hours as needed for nausea or vomiting. 10/15/23  Yes Francesca Elsie CROME, MD  torsemide  (DEMADEX ) 20 MG tablet Take 4 tablets (80 mg total) by mouth 2 (two) times daily. 01/03/24  Yes Milford, Harlene HERO, FNP    Past Medical History: Past Medical History:  Diagnosis Date   Anemia    Aortic stenosis    Arthritis    CHF (congestive heart failure) (HCC)    Coronary artery disease    Diabetes mellitus without complication (HCC)    type 2   GERD (gastroesophageal reflux disease)    Heart murmur    History of blood transfusion    Hypercholesteremia    Hypertension    Hypothyroidism    Mitral regurgitation    Myocardial infarction (HCC)    Peripheral vascular disease    PONV (postoperative nausea and vomiting)    Thyroid  disease     Past Surgical History: Past Surgical History:  Procedure Laterality Date   ABDOMINAL AORTOGRAM W/LOWER EXTREMITY Bilateral 01/14/2020   Procedure: ABDOMINAL AORTOGRAM W/LOWER EXTREMITY;  Surgeon: Gretta Lonni PARAS, MD;  [facility]: [facility]  INVASIVE CV LAB;  Service: Cardiovascular;  Laterality: Bilateral;   ABDOMINAL AORTOGRAM W/LOWER EXTREMITY N/A 07/05/2022   Procedure: ABDOMINAL AORTOGRAM W/LOWER EXTREMITY;  Surgeon: Gretta Lonni PARAS, MD;  Location: [facility];  Service: Cardiovascular;  Laterality: N/A;   ABDOMINAL AORTOGRAM W/LOWER EXTREMITY N/A 08/02/2022   Procedure: ABDOMINAL AORTOGRAM W/LOWER EXTREMITY;  Surgeon: Gretta Lonni PARAS, MD;  Location: [facility];  Service: Cardiovascular;  Laterality: N/A;   ABDOMINAL AORTOGRAM W/LOWER EXTREMITY N/A 08/06/2022   Procedure: ABDOMINAL AORTOGRAM W/LOWER EXTREMITY;  Surgeon: Gretta Lonni PARAS, MD;  Location: [facility];  Service: Cardiovascular;  Laterality: N/A;   AMPUTATION Bilateral 07/20/2022   Procedure: AMPUTATION RIGHT GREAT TOE AND SECOND TOE, AMPUTATION LEFT GREAT TOE;  Surgeon: Harden Jerona GAILS, MD;  Location: [facility] OR;  Service: Orthopedics;  Laterality: Bilateral;   AMPUTATION Bilateral 08/08/2022   Procedure: BILATERAL TRANSMETATARSAL AMPUTATION;  Surgeon: Harden,  Jerona GAILS, MD;  Location: [facility] OR;  Service: Orthopedics;  Laterality: Bilateral;   AMPUTATION Left 10/22/2023   Procedure: AMPUTATION BELOW KNEE;  Surgeon: Lanis Fonda BRAVO, MD;  Location: [facility] OR;  Service: Vascular;  Laterality: Left;   AMPUTATION TOE     BIOPSY  04/06/2023   Procedure: BIOPSY;  Surgeon: Kymberli Elspeth SQUIBB, MD;  Location: [facility] ENDOSCOPY;  Service: Gastroenterology;;   COLONOSCOPY WITH PROPOFOL  N/A 04/06/2023   Procedure: COLONOSCOPY WITH PROPOFOL ;  Surgeon: Gladiola Elspeth SQUIBB, MD;  Location: [facility] ENDOSCOPY;  Service: Gastroenterology;  Laterality: N/A;   CYSTOSCOPY WITH STENT PLACEMENT N/A 11/30/2023   Procedure: CYSTOSCOPY, WITH FULGURATION OF BLEEDING AND CLOT EVACUATION;  Surgeon: Roseann Adine PARAS., MD;  Location: [facility] OR;  Service: Urology;  Laterality: N/A;  CLOT EVACUATION OF BLADDER   ESOPHAGOGASTRODUODENOSCOPY (EGD) WITH PROPOFOL  N/A 04/06/2023   Procedure:  ESOPHAGOGASTRODUODENOSCOPY (EGD) WITH PROPOFOL ;  Surgeon: Francelia Elspeth SQUIBB, MD;  Location: [facility] ENDOSCOPY;  Service: Gastroenterology;  Laterality: N/A;   GIVENS CAPSULE STUDY N/A 04/06/2023   Procedure: GIVENS CAPSULE STUDY;  Surgeon: Iris Elspeth SQUIBB, MD;  Location: [facility] ENDOSCOPY;  Service: Gastroenterology;  Laterality: N/A;   PERIPHERAL VASCULAR ATHERECTOMY Right 01/14/2020   Procedure: PERIPHERAL VASCULAR ATHERECTOMY;  Surgeon: Gretta Lonni PARAS, MD;  Location: [facility] INVASIVE CV LAB;  Service: Cardiovascular;  Laterality: Right;  sfa   PERIPHERAL VASCULAR INTERVENTION Right 01/14/2020   Procedure: PERIPHERAL VASCULAR INTERVENTION;  Surgeon: Gretta Lonni PARAS, MD;  [facility];  Service: Cardiovascular;  Laterality: Right;  sfa stent    PERIPHERAL VASCULAR INTERVENTION  07/05/2022   Procedure: PERIPHERAL VASCULAR INTERVENTION;  Surgeon: Gretta Lonni PARAS, MD;  [facility];  Service: Cardiovascular;;   PERIPHERAL VASCULAR INTERVENTION  08/02/2022   Procedure: PERIPHERAL VASCULAR INTERVENTION;  Surgeon: Gretta Lonni PARAS, MD;  [facility];  Service: Cardiovascular;;   PERIPHERAL VASCULAR INTERVENTION  08/06/2022   Procedure: PERIPHERAL VASCULAR INTERVENTION;  Surgeon: Gretta Lonni PARAS, MD;  Location: [facility] INVASIVE CV LAB;  Service: Cardiovascular;;   PERIPHERAL VASCULAR THROMBECTOMY  08/02/2022   Procedure: PERIPHERAL VASCULAR THROMBECTOMY;  Surgeon: Gretta Lonni PARAS, MD;  [facility];  Service: Cardiovascular;;   REVISION AMPUTATION, BELOW THE KNEE Left 10/23/2023   Procedure: REVISION AMPUTATION, BELOW THE KNEE;  Surgeon: Harden Jerona GAILS, MD;  Location: [facility] OR;  Service: Orthopedics;  Laterality: Left;  REVISION LEFT BELOW KNEE AMPUTATION   RIGHT HEART CATH N/A 11/29/2023   Procedure: RIGHT HEART CATH;  Surgeon: Rolan Ezra RAMAN, MD;  Location: [facility] INVASIVE CV LAB;  Service: Cardiovascular;  Laterality: N/A;   RIGHT HEART  CATH AND CORONARY ANGIOGRAPHY N/A 01/17/2024   Procedure: RIGHT HEART CATH AND CORONARY ANGIOGRAPHY;  Surgeon: Wendel Lurena POUR, MD;  [facility]: [facility] CV [facility];  Service: Cardiovascular;  Laterality: N/A;   RIGHT/LEFT HEART CATH AND CORONARY ANGIOGRAPHY N/A 08/23/2022   Procedure: RIGHT/LEFT HEART CATH AND CORONARY ANGIOGRAPHY;  Surgeon: Dann Candyce RAMAN, MD;  Location: [facility] INVASIVE CV LAB;  Service: Cardiovascular;  Laterality: N/A;   SKIN SPLIT GRAFT Right 03/18/2020   Procedure: SKIN GRAFTING RIGHT FOOT ULCER;  Surgeon: Harden Jerona GAILS, MD;  Location: [facility] OR;  Service: Orthopedics;  Laterality: Right;   STUMP REVISION Right 11/30/2022   Procedure: REVISION RIGHT TRANSMETATARSAL AMPUTATION;  Surgeon: Harden Jerona GAILS, MD;  Location: [facility] OR;  Service: Orthopedics;  Laterality: Right;   TEE WITHOUT CARDIOVERSION N/A 08/27/2022   Procedure: TRANSESOPHAGEAL ECHOCARDIOGRAM;  Surgeon: Hobart Powell BRAVO,  MD;  Location: [facility];  Service: Cardiovascular;  Laterality: N/A;   TRANSESOPHAGEAL ECHOCARDIOGRAM (CATH LAB) N/A 10/24/2023   Procedure: TRANSESOPHAGEAL ECHOCARDIOGRAM;  Surgeon: Michele Richardson, DO;  Location: [facility] CV [facility];  Service: Cardiovascular;  Laterality: N/A;   WISDOM TOOTH EXTRACTION      Family History: Family History  Problem Relation Age of Onset   Diabetes Other    CAD Other     Social History: Social History   Socioeconomic History   Marital status: Divorced    Spouse name: Not on file   Number of children: 0   Years of education: Not on file   Highest education level: Not on file  Occupational History   Not on file  Tobacco Use   Smoking status: Former    Current packs/day: 0.00    Types: Cigarettes    Quit date: 01/2017    Years since quitting: 6.9   Smokeless tobacco: Never  Vaping Use   Vaping status: Never Used  Substance and Sexual Activity   Alcohol use: Not Currently   Drug use: Yes    Types: Marijuana    Comment: on ocassion- Last time -  11/13/22   Sexual activity: Yes    Birth control/protection: Post-menopausal  Other Topics Concern   Not on file  Social History Narrative   Not on file   Social Drivers of Health   Financial Resource Strain: Low Risk  (10/19/2023)   Received from [facility]   Overall Financial Resource Strain (CARDIA)    How hard is it for you to pay for the very basics like food, housing, medical care, and heating?: Not hard at all  Food Insecurity: No Food Insecurity (01/21/2024)   Hunger Vital Sign    Worried About Running Out of Food in the Last Year: Never true    Ran Out of Food in the Last Year: Never true  Transportation Needs: No Transportation Needs (01/21/2024)   PRAPARE - Administrator, Civil Service (Medical): No    Lack of Transportation (Non-Medical): No  Physical Activity: Not on file  Stress: Not on file  Social Connections: Unknown (12/15/2023)   Social Connection and Isolation Panel    Frequency of Communication with Friends and Family: Three times a week    Frequency of Social Gatherings with Friends and Family: Three times a week    Attends Religious Services: Not on file    Active Member of Clubs or Organizations: Not on file    Attends Club or Organization Meetings: Not on file    Marital Status: Not on file    Allergies:  Allergies  Allergen Reactions   Trental  [Pentoxifylline ] Nausea And Vomiting   Vibramycin [Doxycycline] Nausea And Vomiting    Objective:    Vital Signs:   Temp:  [97.9 F (36.6 C)-98.2 F (36.8 C)] 97.9 F (36.6 C) (09/23 0103) Pulse Rate:  [68-100] 68 (09/23 0113) Resp:  [18-27] 20 (09/23 0113) BP: (97-131)/(51-82) 106/82 (09/23 0113) SpO2:  [97 %-100 %] 100 % (09/23 0113) Weight:  [86.9 kg] 86.9 kg (09/23 0103) Last BM Date : 01/21/24  Weight change: Filed Weights   01/21/24 0103  Weight: 86.9 kg    Intake/Output:   Intake/Output Summary (Last 24 hours) at 01/21/2024 0700 Last data filed at 01/21/2024 0139 Gross  per 24 hour  Intake 240 ml  Output --  Net 240 ml      Physical Exam    General:  No  resp difficulty Neck: supple. JVP 10-11 .  Cor:  Regular rate & rhythm. No rubs, gallops or murmurs. Lungs: Crackles in the bases.  Abdomen: soft, nontender. Extremities: no cyanosis, clubbing, rash, RLE 1+ edema LBKA  Neuro: alert & oriented x3  Telemetry   SR with frequent PVCs.   EKG      Labs   Basic Metabolic Panel: Recent Labs  Lab 01/17/24 0806 01/17/24 1123 01/17/24 1129 01/17/24 1131 01/20/24 1501  NA 137 141 143 143 136  K 4.5 3.9 3.6 3.7 4.1  CL 111  --   --   --  102  CO2 15*  --   --   --  19*  GLUCOSE 136*  --   --   --  185*  BUN 23*  --   --   --  99*  CREATININE 1.97*  --   --   --  2.49*  CALCIUM  9.1  --   --   --  9.1    Liver Function Tests: No results for input(s): AST, ALT, ALKPHOS, BILITOT, PROT, ALBUMIN  in the last 168 hours. No results for input(s): LIPASE, AMYLASE in the last 168 hours. No results for input(s): AMMONIA in the last 168 hours.  CBC: Recent Labs  Lab 01/17/24 1123 01/17/24 1129 01/17/24 1131 01/20/24 1501  WBC  --   --   --  10.0  HGB 8.5* 7.5* 7.5* 8.2*  HCT 25.0* 22.0* 22.0* 26.4*  MCV  --   --   --  92.3  PLT  --   --   --  268    Cardiac Enzymes: No results for input(s): CKTOTAL, CKMB, CKMBINDEX, TROPONINI in the last 168 hours.  BNP: BNP (last 3 results) Recent Labs    12/15/23 0959 01/03/24 1448 01/20/24 1501  BNP 1,108.0* 1,131.9* 1,011.9*    ProBNP (last 3 results) No results for input(s): PROBNP in the last 8760 hours.   CBG: Recent Labs  Lab 01/17/24 0711 01/21/24 0003 01/21/24 0612  GLUCAP 161* 177* 176*    Coagulation Studies: No results for input(s): LABPROT, INR in the last 72 hours.   Imaging   DG Chest 2 View Result Date: 01/20/2024 CLINICAL DATA:  Shortness of breath. EXAM: CHEST - 2 VIEW COMPARISON:  12/15/2023 and CT chest 08/18/2022. FINDINGS:  Trachea is midline. Heart size is grossly stable. Diffuse airspace opacification with bilateral pleural effusions. IMPRESSION: Congestive heart failure. Electronically Signed   By: Newell Eke M.D.   On: 01/20/2024 16:09     Medications:     Current Medications:  aspirin  EC  81 mg Oral Daily   atorvastatin   40 mg Oral Daily   clopidogrel   75 mg Oral Daily   ferrous sulfate   325 mg Oral Q breakfast   gabapentin   300 mg Oral QHS   heparin   5,000 Units Subcutaneous Q8H   insulin  aspart  0-5 Units Subcutaneous QHS   insulin  aspart  0-9 Units Subcutaneous TID WC   levothyroxine   150 mcg Oral QAC breakfast   midodrine   5 mg Oral TID WC   pantoprazole   40 mg Oral Daily    Infusions:     Patient Profile   57 y.o. 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     Admitted with A/C HFrEF   Assessment/Plan  A/C HFrEF, ICM  EF has been  20-25% for last 3-4 months .  - Most recent cath last week-  RA 8, PAP 53/29 (40), PCWP 29 w/ v waves to 44 mmHg, CO 6.7 L/min and Fick cardiac index of 3.6 L/min/m, PVR 1.6 WU, PAPi 3.  Volume overloaded on admit. Needs additional diuresis. Continue IV lasix  today and reassess tomorrow. Prior to admit she was taking torsemide  80 mg twice a day. Suspect she will need 100 mg twice a dya.  GDMT limited by hypotension/CKD.  No SGLT2i with UTIs.  On midodrine  5 mg three times a day.   Elevated Troponin   Multivessel CAD. CABG had been recommended but managed medically due to other active medical issues - Repeat LHC done 9/25 for TAVR w/u. CAD stable from prior study. No chest pain. Suspect demand ischemia   - Continue DAPT ASA+ Plavix  and statin   CKD Stage IIIa ecent AKIs in setting of volume overload/low-output>>Started on CVVH 11/16/23, stopped 11/19/23, followed by recurrent AKI 2/2 bladder outlet obstruction  from extensive clots in bladder.  S/p cystoscopy and clot removal   Anemia HX of AVMs Hgb down to 7. No obvious bleeding source.   Hypothyroidism.  On Levothyroxine .   PVC Frequent PVCs on tele. Consider adding mexitil.   PAD HX SFA Stents  On aspirin /plavix /statin   Valvular Heart Disease  Mild to moderate MR and moderate to severe AS on echo 6/25 - Repeat echo 7/25 with extremely poor windows, based on exam expect AS moderate-severe - Echo 11/20/23 with Vmax 3.21, DVT 0.26, mod to severe AS, mG 22 - Structural Heart Team following for potential TAVR.    Length of Stay: 1  Osie Amparo, NP  01/21/2024, 7:00 AM    Advanced Heart Failure Team Pager [phone] (M-F; 7a - 5p)  Please contact [facility] for night-coverage after hours (4p -7a ) and weekends on [email or web address]

## 2024-01-21 NOTE — Assessment & Plan Note (Signed)
 Continue with levothyroxine

## 2024-01-21 NOTE — Consult Note (Signed)
 WOC Nurse Consult Note: patient underwent L BKA 10/23/2023; seen in ortho office 01/17/2024 with known wound dehiscence to stump and ordered to use Vashe WTD dressing changes daily  Reason for Consult: L stump wound  Wound type: full thickness surgical dehiscence as above  Pressure Injury POA: NA not pressure  Measurement:lateral aspect 1 cm x 2 cm x 2 cm; medial aspect 2 cm x 3 cm x 1 cm per ortho note 9/19 Wound bed: lateral wound 50% tan 50% red moist; medial aspect 90% tan fibrinous 10% red  Drainage (amount, consistency, odor) see nursing flowsheet  Periwound: Dressing procedure/placement/frequency:  Cleanse L stump wounds with Vashe wound cleanser Soila [phone]) do not rinse and allow to air dry. Apply Vashe moistened gauze to wound beds daily, cover with dry gauze and secure with Kerlix roll gauze or silicone foam whichever patient prefers/works best.    POC discussed with bedside nurse. Patient has appointment for ongoing care with ortho in 2 weeks.    WOC team will not follow. Re-consult if further needs arise.   Thank you,    Powell Bar MSN, RN-BC, [employer]

## 2024-01-21 NOTE — Assessment & Plan Note (Signed)
Continue dual antiplatelet therapy and statin   

## 2024-01-21 NOTE — Assessment & Plan Note (Signed)
 Hyper P   Renal function with serum cr at 2,42 with K at 3,8 and serum bicarbonate at 23  Na 137 and P 6,2   Continue diuresis with furosemide  IV and follow up renal function and electrolytes in am

## 2024-01-22 DIAGNOSIS — I5023 Acute on chronic systolic (congestive) heart failure: Secondary | ICD-10-CM | POA: Diagnosis not present

## 2024-01-22 LAB — PREPARE RBC (CROSSMATCH)

## 2024-01-22 LAB — BASIC METABOLIC PANEL WITH GFR
Anion gap: 14 (ref 5–15)
BUN: 98 mg/dL — ABNORMAL HIGH (ref 6–20)
CO2: 24 mmol/L (ref 22–32)
Calcium: 9.2 mg/dL (ref 8.9–10.3)
Chloride: 101 mmol/L (ref 98–111)
Creatinine, Ser: 2.3 mg/dL — ABNORMAL HIGH (ref 0.44–1.00)
GFR, Estimated: 24 mL/min — ABNORMAL LOW (ref >60)
Glucose, Bld: 161 mg/dL — ABNORMAL HIGH (ref 70–99)
Potassium: 3.2 mmol/L — ABNORMAL LOW (ref 3.5–5.1)
Sodium: 139 mmol/L (ref 135–145)

## 2024-01-22 LAB — CBC
HCT: 21.2 % — ABNORMAL LOW (ref 36.0–46.0)
Hemoglobin: 6.7 g/dL — CL (ref 12.0–15.0)
MCH: 28.8 pg (ref 26.0–34.0)
MCHC: 31.6 g/dL (ref 30.0–36.0)
MCV: 91 fL (ref 80.0–100.0)
Platelets: 254 K/uL (ref 150–400)
RBC: 2.33 MIL/uL — ABNORMAL LOW (ref 3.87–5.11)
RDW: 14.7 % (ref 11.5–15.5)
WBC: 6 K/uL (ref 4.0–10.5)
nRBC: 0 % (ref 0.0–0.2)

## 2024-01-22 LAB — GLUCOSE, CAPILLARY
Glucose-Capillary: 162 mg/dL — ABNORMAL HIGH (ref 70–99)
Glucose-Capillary: 142 mg/dL — ABNORMAL HIGH (ref 70–99)
Glucose-Capillary: 207 mg/dL — ABNORMAL HIGH (ref 70–99)

## 2024-01-22 LAB — HEMOGLOBIN AND HEMATOCRIT, BLOOD
HCT: 28.4 % — ABNORMAL LOW (ref 36.0–46.0)
Hemoglobin: 9.2 g/dL — ABNORMAL LOW (ref 12.0–15.0)

## 2024-01-22 LAB — MAGNESIUM: Magnesium: 2.2 mg/dL (ref 1.7–2.4)

## 2024-01-22 MED ORDER — POTASSIUM CHLORIDE CRYS ER 20 MEQ PO TBCR
60.0000 meq | EXTENDED_RELEASE_TABLET | Freq: Once | ORAL | Status: AC
  Administered 2024-01-22: 60 meq via ORAL
  Filled 2024-01-22: qty 3

## 2024-01-22 MED ORDER — SODIUM CHLORIDE 0.9% IV SOLUTION: Freq: Once | INTRAVENOUS | Status: AC

## 2024-01-22 NOTE — Progress Notes (Signed)
 Hgb is 6.7. Plan to transfuse 1 unit RBCs.

## 2024-01-22 NOTE — Progress Notes (Signed)
 Advanced Heart Failure Rounding Note  Cardiologist: Vishnu SHAUNNA Maywood, MD  Chief Complaint: A/C HFrEF Subjective:    Mom at bedside. Feels much better this morning.   Diuresed 1.3L UOP yesterday. Weight unchanged.   Hgb 6.7 this am. 1uPRBC ordered by primary.   Objective:   Weight Range: 87 kg Body mass index is 32.92 kg/m.   Vital Signs:   Temp:  [97.6 F (36.4 C)-98.4 F (36.9 C)] 98.4 F (36.9 C) (09/24 0848) Pulse Rate:  [82-96] 96 (09/24 0848) Resp:  [16-20] 16 (09/24 0848) BP: (92-109)/(53-70) 92/61 (09/24 0848) SpO2:  [90 %-100 %] 98 % (09/24 0848) Weight:  [87 kg] 87 kg (09/24 0125) Last BM Date : 01/20/24  Weight change: Filed Weights   01/21/24 0103 01/22/24 0125  Weight: 86.9 kg 87 kg    Intake/Output:   Intake/Output Summary (Last 24 hours) at 01/22/2024 1011 Last data filed at 01/22/2024 0856 Gross per 24 hour  Intake 492.13 ml  Output 1800 ml  Net -1307.87 ml    Physical Exam   General:  chronically ill appearing.  No respiratory difficulty Neck: JVD ~9 cm.  Cor: Regular rate & rhythm. No murmurs. Lungs: crackles at bases Extremities: +1 RLE edema. L BKA Neuro: alert & oriented x 3. Affect pleasant.   Telemetry   NSR 90s (Personally reviewed)    Labs    CBC Recent Labs    01/21/24 0659 01/22/24 0239  WBC 6.1 6.0  HGB 7.0* 6.7*  HCT 22.2* 21.2*  MCV 91.0 91.0  PLT 258 254   Basic Metabolic Panel Recent Labs    90/76/74 0659 01/22/24 0239  NA 137 139  K 3.8 3.2*  CL 100 101  CO2 23 24  GLUCOSE 171* 161*  BUN 102* 98*  CREATININE 2.42* 2.30*  CALCIUM  9.3 9.2  MG 2.3 2.2  PHOS 6.2*  --    Liver Function Tests No results for input(s): AST, ALT, ALKPHOS, BILITOT, PROT, ALBUMIN  in the last 72 hours. No results for input(s): LIPASE, AMYLASE in the last 72 hours. Cardiac Enzymes No results for input(s): CKTOTAL, CKMB, CKMBINDEX, TROPONINI in the last 72 hours.  BNP: BNP (last 3  results) Recent Labs    12/15/23 0959 01/03/24 1448 01/20/24 1501  BNP 1,108.0* 1,131.9* 1,011.9*    ProBNP (last 3 results) No results for input(s): PROBNP in the last 8760 hours.   D-Dimer No results for input(s): DDIMER in the last 72 hours. Hemoglobin A1C No results for input(s): HGBA1C in the last 72 hours. Fasting Lipid Panel No results for input(s): CHOL, HDL, LDLCALC, TRIG, CHOLHDL, LDLDIRECT in the last 72 hours. Thyroid  Function Tests No results for input(s): TSH, T4TOTAL, T3FREE, THYROIDAB in the last 72 hours.  Invalid input(s): FREET3  Other results:   Imaging    No results found.   Medications:     Scheduled Medications:  sodium chloride    Intravenous Once   aspirin  EC  81 mg Oral Daily   atorvastatin   40 mg Oral Daily   clopidogrel   75 mg Oral Daily   ferrous sulfate   325 mg Oral Q breakfast   gabapentin   300 mg Oral QHS   heparin   5,000 Units Subcutaneous Q8H   insulin  aspart  0-5 Units Subcutaneous QHS   insulin  aspart  0-9 Units Subcutaneous TID WC   levothyroxine   150 mcg Oral QAC breakfast   midodrine   5 mg Oral TID WC   pantoprazole   40 mg Oral Daily  Infusions:  furosemide  160 mg (01/22/24 0853)    PRN Medications: acetaminophen , melatonin, polyethylene glycol, prochlorperazine     Patient Profile   58 y.o. 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    Admitted with A/C HFrEF.   Assessment/Plan  A/C HFrEF, ICM  EF has been  20-25% for last 3-4 months .  - Most recent cath last week-  RA 8, PAP 53/29 (40), PCWP 29 w/ v waves to 44 mmHg, CO 6.7 L/min and Fick cardiac index of 3.6 L/min/m, PVR 1.6 WU, PAPi 3.  - Remains slightly volume overloaded on exam. Continue IV diuresis with 160 IV BID. She's getting 1uPRBC this morning but otherwise we will get a ReDS clip  today/tomorrow to assess candidacy for DAMP-HF. Eager to go home. Primary team notified.  GDMT limited by hypotension/CKD.  No SGLT2i with UTIs.  On midodrine  5 mg three times a day.    Elevated Troponin   Multivessel CAD. CABG had been recommended but managed medically due to other active medical issues - Repeat LHC done 9/25 for TAVR w/u. CAD stable from prior study. No chest pain. Suspect demand ischemia   - Continue DAPT ASA+ Plavix  and statin    CKD Stage IIIa ecent AKIs in setting of volume overload/low-output>>Started on CVVH 11/16/23, stopped 11/19/23, followed by recurrent AKI 2/2 bladder outlet obstruction from extensive clots in bladder.  S/p cystoscopy and clot removal    Anemia HX of AVMs Hgb down to 6.7. No obvious bleeding source.  Plan for 1uPRBC this morning.    Hypothyroidism.  On Levothyroxine .    PVC Frequent PVCs on tele. Consider adding mexitil.    PAD HX SFA Stents  On aspirin /plavix /statin    Valvular Heart Disease  Mild to moderate MR and moderate to severe AS on echo 6/25 - Repeat echo 7/25 with extremely poor windows, based on exam expect AS moderate-severe - Echo 11/20/23 with Vmax 3.21, DVT 0.26, mod to severe AS, mG 22 - Structural Heart Team following for potential TAVR.    Length of Stay: 57  Kayla LITTIE Coe, NP  01/22/2024, 10:11 AM  Advanced Heart Failure Team Pager [phone] (M-F; 7a - 5p)  Please contact [facility] for night-coverage after hours (5p -7a ) and weekends on [email or web address]

## 2024-01-22 NOTE — Progress Notes (Signed)
 REDS Clip  READING (normal 20-35%) = 55%  CHEST RULER (in) =29 Clip Station =  A  Patient Reds reading was 55 %, she tolerated it well. Results were verbally given to Beckey Coe, NP with Advanced Heart Failure Team.    Stephane Haddock, BSN, RN Heart Failure Nurse Navigator Secure Chat Only

## 2024-01-22 NOTE — Plan of Care (Signed)

## 2024-01-23 ENCOUNTER — Telehealth (HOSPITAL_COMMUNITY): Payer: Self-pay

## 2024-01-23 ENCOUNTER — Other Ambulatory Visit (HOSPITAL_COMMUNITY): Payer: Self-pay

## 2024-01-23 DIAGNOSIS — I5023 Acute on chronic systolic (congestive) heart failure: Secondary | ICD-10-CM | POA: Diagnosis not present

## 2024-01-23 LAB — GLUCOSE, CAPILLARY
Glucose-Capillary: 148 mg/dL — ABNORMAL HIGH (ref 70–99)
Glucose-Capillary: 235 mg/dL — ABNORMAL HIGH (ref 70–99)
Glucose-Capillary: 176 mg/dL — ABNORMAL HIGH (ref 70–99)
Glucose-Capillary: 239 mg/dL — ABNORMAL HIGH (ref 70–99)

## 2024-01-23 LAB — TYPE AND SCREEN
ABO/RH(D): O POS
Antibody Screen: NEGATIVE
Unit division: 0

## 2024-01-23 LAB — BASIC METABOLIC PANEL WITH GFR
Anion gap: 15 (ref 5–15)
BUN: 89 mg/dL — ABNORMAL HIGH (ref 6–20)
CO2: 21 mmol/L — ABNORMAL LOW (ref 22–32)
Calcium: 9.2 mg/dL (ref 8.9–10.3)
Chloride: 101 mmol/L (ref 98–111)
Creatinine, Ser: 2.13 mg/dL — ABNORMAL HIGH (ref 0.44–1.00)
GFR, Estimated: 27 mL/min — ABNORMAL LOW (ref >60)
Glucose, Bld: 139 mg/dL — ABNORMAL HIGH (ref 70–99)
Potassium: 3.7 mmol/L (ref 3.5–5.1)
Sodium: 137 mmol/L (ref 135–145)

## 2024-01-23 LAB — CBC
HCT: 25 % — ABNORMAL LOW (ref 36.0–46.0)
Hemoglobin: 8 g/dL — ABNORMAL LOW (ref 12.0–15.0)
MCH: 29.4 pg (ref 26.0–34.0)
MCHC: 32 g/dL (ref 30.0–36.0)
MCV: 91.9 fL (ref 80.0–100.0)
Platelets: 274 K/uL (ref 150–400)
RBC: 2.72 MIL/uL — ABNORMAL LOW (ref 3.87–5.11)
RDW: 14.6 % (ref 11.5–15.5)
WBC: 7.2 K/uL (ref 4.0–10.5)
nRBC: 0 % (ref 0.0–0.2)

## 2024-01-23 LAB — BPAM RBC
Blood Product Expiration Date: 202510212359
ISSUE DATE / TIME: 202509241128
Unit Type and Rh: 5100

## 2024-01-23 LAB — HEMOGLOBIN AND HEMATOCRIT, BLOOD
HCT: 26.2 % — ABNORMAL LOW (ref 36.0–46.0)
Hemoglobin: 8.6 g/dL — ABNORMAL LOW (ref 12.0–15.0)

## 2024-01-23 MED ORDER — POTASSIUM CHLORIDE CRYS ER 20 MEQ PO TBCR
20.0000 meq | EXTENDED_RELEASE_TABLET | Freq: Once | ORAL | Status: AC
  Administered 2024-01-23: 20 meq via ORAL
  Filled 2024-01-23: qty 1

## 2024-01-23 MED ORDER — ENOXAPARIN SODIUM 30 MG/0.3ML IJ SOSY: 30.0000 mg | PREFILLED_SYRINGE | Freq: Every day | INTRAMUSCULAR | Status: DC

## 2024-01-23 MED ORDER — IRON SUCROSE 300 MG IVPB - SIMPLE MED
300.0000 mg | Freq: Once | Status: AC
  Administered 2024-01-23: 300 mg via INTRAVENOUS
  Filled 2024-01-23: qty 300

## 2024-01-23 MED ORDER — METOLAZONE 5 MG PO TABS
5.0000 mg | ORAL_TABLET | Freq: Once | ORAL | Status: AC
Start: 2024-01-23 — End: 2024-01-23
  Administered 2024-01-23: 5 mg via ORAL
  Filled 2024-01-23: qty 1

## 2024-01-23 NOTE — Progress Notes (Signed)
 PROGRESS NOTE    Kayla Schmitt  FMW:[id number] DOB: 05-19-1965 DOA: 01/20/2024 PCP: Kayla Jacquline NOVAK, NP  58/F w acute on chronic systolic CHF, PAD sp B/L TMA, 3V CAD, type 2 diabetes, hypertension, hyperlipidemia, chronic anemia secondary to GI bleeds from AVMs admitted from heart failure clinic with severe volume overload. Workup in the ER significant for creatinine of 2.5, chest x-ray with diffuse bilateral pulmonary edema/pleural effusions and EKG with normal sinus rhythm with frequent PVCs.   Subjective: Feels better overall, breathing is improving, anxious to go home  Assessment and Plan:  Acute on chronic systolic CHF -Echo with EF 25%, moderate mitral regurgitation, positive aortic stenosis.  - Somewhat limited response to diuretics, Lasix  dose increased to 160 mg twice daily with metolazone  -continue midodrine  -GDMT limited by hypotension/CKD  Mod to severe AS -structural heart team foll for TAVR down the road  3V CAD (coronary artery disease) PAD High sensitive troponin elevation due to heart failure exacerbation with volume overload. CABG has been recommended but now on med mgt due to ongoing med issues Continue aspirin , clopidogrel  and statin therapy  Chronic kidney disease, stage 4 - Stable, monitor with diuresis  Type 2 diabetes mellitus with hyperlipidemia (HCC) Continue glucose cover and monitoring with insulin  sliding scale.  Continue statin   S/P BKA (below knee amputation) unilateral, left (HCC) Follow up as outpatient  PVD (peripheral vascular disease) Continue dual antiplatelet therapy and statin   GERD (gastroesophageal reflux disease) Continue with pantoprazole   Iron  deficiency anemia Known AVMs Hemoglobin down to 6.7 on 9/24, no overt bleeding Transfused 1 unit PRBC yesterday, add IV iron   Hypothyroidism Continue with levothyroxine    Obesity, class 1 Calculated BMI is 32.8    DVT prophylaxis: hep SQ Code Status: Full  code Family Communication: Mother at bedside Disposition Plan: Home tomorrow if stable  Consultants:    Procedures:   Antimicrobials:    Objective: Vitals:   01/23/24 0513 01/23/24 0515 01/23/24 0807 01/23/24 1100  BP:  114/75 95/62 102/80  Pulse: (!) 58 (!) 109 87 82  Resp:  19 19 20   Temp:  97.9 F (36.6 C) 98.5 F (36.9 C)   TempSrc:  Oral Oral Oral  SpO2: (!) 88% 94% 98% 98%  Weight:  86.3 kg    Height:        Intake/Output Summary (Last 24 hours) at 01/23/2024 1211 Last data filed at 01/23/2024 0600 Gross per 24 hour  Intake 1158 ml  Output 500 ml  Net 658 ml   Filed Weights   01/21/24 0103 01/22/24 0125 01/23/24 0515  Weight: 86.9 kg 87 kg 86.3 kg    Examination:  Gen: Awake, Alert, Oriented X 3,  HEENT: + JVD Lungs: Few basilar rales otherwise clear CVS: S1S2/RRR Abd: soft, Non tender, non distended, BS present Extremities: 1+ edema, left BKA Skin: no new rashes on exposed skin     Data Reviewed:   CBC: Recent Labs  Lab 01/20/24 1501 01/21/24 0659 01/22/24 0239 01/22/24 1802 01/23/24 0210  WBC 10.0 6.1 6.0  --  7.2  HGB 8.2* 7.0* 6.7* 9.2* 8.0*  HCT 26.4* 22.2* 21.2* 28.4* 25.0*  MCV 92.3 91.0 91.0  --  91.9  PLT 268 258 254  --  274   Basic Metabolic Panel: Recent Labs  Lab 01/17/24 0806 01/17/24 1123 01/17/24 1131 01/20/24 1501 01/21/24 0659 01/22/24 0239 01/23/24 0210  NA 137   < > 143 136 137 139 137  K 4.5   < > 3.7  4.1 3.8 3.2* 3.7  CL 111  --   --  102 100 101 101  CO2 15*  --   --  19* 23 24 21*  GLUCOSE 136*  --   --  185* 171* 161* 139*  BUN 23*  --   --  99* 102* 98* 89*  CREATININE 1.97*  --   --  2.49* 2.42* 2.30* 2.13*  CALCIUM  9.1  --   --  9.1 9.3 9.2 9.2  MG  --   --   --   --  2.3 2.2  --   PHOS  --   --   --   --  6.2*  --   --    < > = values in this interval not displayed.   GFR: Estimated Creatinine Clearance: 31 mL/min (A) (by C-G formula based on SCr of 2.13 mg/dL (H)). Liver Function Tests: No  results for input(s): AST, ALT, ALKPHOS, BILITOT, PROT, ALBUMIN  in the last 168 hours. No results for input(s): LIPASE, AMYLASE in the last 168 hours. No results for input(s): AMMONIA in the last 168 hours. Coagulation Profile: No results for input(s): INR, PROTIME in the last 168 hours. Cardiac Enzymes: No results for input(s): CKTOTAL, CKMB, CKMBINDEX, TROPONINI in the last 168 hours. BNP (last 3 results) No results for input(s): PROBNP in the last 8760 hours. HbA1C: No results for input(s): HGBA1C in the last 72 hours. CBG: Recent Labs  Lab 01/22/24 0619 01/22/24 1647 01/22/24 2120 01/23/24 0551 01/23/24 1057  GLUCAP 162* 142* 207* 148* 235*   Lipid Profile: No results for input(s): CHOL, HDL, LDLCALC, TRIG, CHOLHDL, LDLDIRECT in the last 72 hours. Thyroid  Function Tests: No results for input(s): TSH, T4TOTAL, FREET4, T3FREE, THYROIDAB in the last 72 hours. Anemia Panel: No results for input(s): VITAMINB12, FOLATE, FERRITIN, TIBC, IRON , RETICCTPCT in the last 72 hours. Urine analysis:    Component Value Date/Time   COLORURINE BROWN (A) 11/26/2023 1024   APPEARANCEUR Clear 01/02/2024 0000   LABSPEC  11/26/2023 1024    TEST NOT REPORTED DUE TO COLOR INTERFERENCE OF URINE PIGMENT   PHURINE  11/26/2023 1024    TEST NOT REPORTED DUE TO COLOR INTERFERENCE OF URINE PIGMENT   GLUCOSEU Negative 01/02/2024 0000   HGBUR (A) 11/26/2023 1024    TEST NOT REPORTED DUE TO COLOR INTERFERENCE OF URINE PIGMENT   BILIRUBINUR Negative 01/02/2024 0000   KETONESUR (A) 11/26/2023 1024    TEST NOT REPORTED DUE TO COLOR INTERFERENCE OF URINE PIGMENT   PROTEINUR Negative 01/02/2024 0000   PROTEINUR (A) 11/26/2023 1024    TEST NOT REPORTED DUE TO COLOR INTERFERENCE OF URINE PIGMENT   NITRITE Negative 01/02/2024 0000   NITRITE (A) 11/26/2023 1024    TEST NOT REPORTED DUE TO COLOR INTERFERENCE OF URINE PIGMENT   LEUKOCYTESUR  Trace (A) 01/02/2024 0000   LEUKOCYTESUR (A) 11/26/2023 1024    TEST NOT REPORTED DUE TO COLOR INTERFERENCE OF URINE PIGMENT   Sepsis Labs: @LABRCNTIP (procalcitonin:4,lacticidven:4)  )No results found for this or any previous visit (from the past 240 hours).   Radiology Studies: No results found.    Scheduled Meds:  aspirin  EC  81 mg Oral Daily   atorvastatin   40 mg Oral Daily   clopidogrel   75 mg Oral Daily   ferrous sulfate   325 mg Oral Q breakfast   gabapentin   300 mg Oral QHS   heparin   5,000 Units Subcutaneous Q8H   insulin  aspart  0-5 Units Subcutaneous QHS   insulin   aspart  0-9 Units Subcutaneous TID WC   levothyroxine   150 mcg Oral QAC breakfast   midodrine   5 mg Oral TID WC   pantoprazole   40 mg Oral Daily   Continuous Infusions:  furosemide  160 mg (01/22/24 1721)   iron  sucrose 300 mg (01/23/24 1044)     LOS: 3 days    Time spent:    Sigurd Pac, MD [facility]   01/23/2024, 12:11 PM

## 2024-01-23 NOTE — Progress Notes (Signed)
 Pharmacist Heart Failure Core Measure Documentation  Assessment: Kayla Schmitt has an EF documented as 25% on 01/21/2024 by Echo.  Rationale: Heart failure patients with left ventricular systolic dysfunction (LVSD) and an EF < 40% should be prescribed an angiotensin converting enzyme inhibitor (ACEI) or angiotensin receptor blocker (ARB) at discharge unless a contraindication is documented in the medical record.  This patient is not currently on an ACEI or ARB for HF.  This note is being placed in the record in order to provide documentation that a contraindication to the use of these agents is present for this encounter.  ACE Inhibitor or Angiotensin Receptor Blocker is contraindicated (specify all that apply)  []   ACEI allergy AND ARB allergy []   Angioedema []   Moderate or severe aortic stenosis []   Hyperkalemia [x]   Hypotension []   Renal artery stenosis [x]   Worsening renal function, preexisting renal disease or dysfunction  Prentice Poisson, PharmD Clinical Pharmacist **Pharmacist phone directory can now be found on [email or web address] ([email or web address]).  Listed under [facility].

## 2024-01-23 NOTE — Progress Notes (Signed)
 Advanced Heart Failure Rounding Note  Cardiologist: Vishnu SHAUNNA Maywood, MD  Chief Complaint: A/C HFrEF Subjective:    -1L UOP yesterday. Suspect not accurate though weight unchanged.   Mom at bedside.   Patient with no complaints just eager to go home.   Objective:   Weight Range: 86.3 kg Body mass index is 32.66 kg/m.   Vital Signs:   Temp:  [97.6 F (36.4 C)-98.5 F (36.9 C)] 98.5 F (36.9 C) (09/25 0807) Pulse Rate:  [58-109] 87 (09/25 0807) Resp:  [12-20] 19 (09/25 0807) BP: (92-114)/(62-84) 95/62 (09/25 0807) SpO2:  [88 %-99 %] 98 % (09/25 0807) Weight:  [86.3 kg] 86.3 kg (09/25 0515) Last BM Date : 01/20/24  Weight change: Filed Weights   01/21/24 0103 01/22/24 0125 01/23/24 0515  Weight: 86.9 kg 87 kg 86.3 kg    Intake/Output:   Intake/Output Summary (Last 24 hours) at 01/23/2024 0852 Last data filed at 01/23/2024 0600 Gross per 24 hour  Intake 1158 ml  Output 1000 ml  Net 158 ml    Physical Exam   General:  chronically ill appearing, sitting on EOB.  No respiratory difficulty Neck: JVD ~10 cm.  Cor: Regular rate & rhythm. No murmurs. Lungs: crackles at bases Extremities: trace RLE edema. L BKA Neuro: alert & oriented x 3. Affect pleasant.   Telemetry   NSR 80s 0-12 PVCs/hr (Personally reviewed)    Labs    CBC Recent Labs    01/22/24 0239 01/22/24 1802 01/23/24 0210  WBC 6.0  --  7.2  HGB 6.7* 9.2* 8.0*  HCT 21.2* 28.4* 25.0*  MCV 91.0  --  91.9  PLT 254  --  274   Basic Metabolic Panel Recent Labs    90/76/74 0659 01/22/24 0239 01/23/24 0210  NA 137 139 137  K 3.8 3.2* 3.7  CL 100 101 101  CO2 23 24 21*  GLUCOSE 171* 161* 139*  BUN 102* 98* 89*  CREATININE 2.42* 2.30* 2.13*  CALCIUM  9.3 9.2 9.2  MG 2.3 2.2  --   PHOS 6.2*  --   --    Liver Function Tests No results for input(s): AST, ALT, ALKPHOS, BILITOT, PROT, ALBUMIN  in the last 72 hours. No results for input(s): LIPASE, AMYLASE in the last 72  hours. Cardiac Enzymes No results for input(s): CKTOTAL, CKMB, CKMBINDEX, TROPONINI in the last 72 hours.  BNP: BNP (last 3 results) Recent Labs    12/15/23 0959 01/03/24 1448 01/20/24 1501  BNP 1,108.0* 1,131.9* 1,011.9*    ProBNP (last 3 results) No results for input(s): PROBNP in the last 8760 hours.   D-Dimer No results for input(s): DDIMER in the last 72 hours. Hemoglobin A1C No results for input(s): HGBA1C in the last 72 hours. Fasting Lipid Panel No results for input(s): CHOL, HDL, LDLCALC, TRIG, CHOLHDL, LDLDIRECT in the last 72 hours. Thyroid  Function Tests No results for input(s): TSH, T4TOTAL, T3FREE, THYROIDAB in the last 72 hours.  Invalid input(s): FREET3  Other results:   Imaging    No results found.   Medications:     Scheduled Medications:  aspirin  EC  81 mg Oral Daily   atorvastatin   40 mg Oral Daily   clopidogrel   75 mg Oral Daily   ferrous sulfate   325 mg Oral Q breakfast   gabapentin   300 mg Oral QHS   heparin   5,000 Units Subcutaneous Q8H   insulin  aspart  0-5 Units Subcutaneous QHS   insulin  aspart  0-9 Units Subcutaneous TID  WC   levothyroxine   150 mcg Oral QAC breakfast   midodrine   5 mg Oral TID WC   pantoprazole   40 mg Oral Daily    Infusions:  furosemide  160 mg (01/22/24 1721)    PRN Medications: acetaminophen , melatonin, polyethylene glycol, prochlorperazine     Patient Profile   58 y.o. 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    Admitted with A/C HFrEF.   Assessment/Plan  A/C HFrEF, ICM  EF has been  20-25% for last 3-4 months .  - Most recent cath last week-  RA 8, PAP 53/29 (40), PCWP 29 w/ v waves to 44 mmHg, CO 6.7 L/min and Fick cardiac index of 3.6 L/min/m, PVR 1.6 WU, PAPi 3.  - Remains volume overloaded on exam. Continue IV  diuresis with 160 IV BID. ReDS clip 55% this morning. Plan for possible discharge with DAMP-HF trial once ReDS reading ~45-50. Eager to go home. Primary team aware. Will give metolazone  5 mg x1.  Follow response, may need PM dose.  GDMT limited by hypotension/CKD.  No SGLT2i with UTIs.  On midodrine  5 mg three times a day.    Elevated Troponin   Multivessel CAD. CABG had been recommended but managed medically due to other active medical issues - Repeat LHC done 9/25 for TAVR w/u. CAD stable from prior study. No chest pain. Suspect demand ischemia   - Continue DAPT ASA+ Plavix  and statin    CKD Stage IIIa ecent AKIs in setting of volume overload/low-output>>Started on CVVH 11/16/23, stopped 11/19/23, followed by recurrent AKI 2/2 bladder outlet obstruction from extensive clots in bladder.  S/p cystoscopy and clot removal    Anemia HX of AVMs Hgb 8, got 1uPRBC yesterday. No obvious bleeding source.    Hypothyroidism.  On Levothyroxine .    PVC Frequent PVCs on tele. Consider adding mexitil.  - Very intermittent on tele review today   PAD HX SFA Stents  On aspirin /plavix /statin    Valvular Heart Disease  Mild to moderate MR and moderate to severe AS on echo 6/25 - Repeat echo 7/25 with extremely poor windows, based on exam expect AS moderate-severe - Echo 11/20/23 with Vmax 3.21, DVT 0.26, mod to severe AS, mG 22 - Structural Heart Team following for potential TAVR.    Length of Stay: 3  Beckey LITTIE Coe, NP  01/23/2024, 8:52 AM  Advanced Heart Failure Team Pager [phone] (M-F; 7a - 5p)  Please contact [facility] for night-coverage after hours (5p -7a ) and weekends on [email or web address]

## 2024-01-23 NOTE — Telephone Encounter (Addendum)
 Advanced Heart Failure Patient Advocate Encounter  Prior authorization for Furoscix  has been submitted and approved. Test billing returns $0 for 2 day supply. This plan limits 2 kits per 22 days.  KeyBETHA PETER Effective: 01/23/2024 to 01/22/2025  Rachel DEL, CPhT Rx Patient Advocate Phone: [phone]

## 2024-01-23 NOTE — Progress Notes (Signed)
 REDS Clip  READING (normal 20-35%) = 55%  CHEST RULER (in) =28 Clip Station =  A  Patient tolerated Reds reading well. Beckey Coe, NP with Advanced Heart Failure Team was notified of the Readings.   Stephane Haddock, BSN, Scientist, clinical (histocompatibility and immunogenetics) Only

## 2024-01-23 NOTE — Plan of Care (Signed)

## 2024-01-24 ENCOUNTER — Inpatient Hospital Stay (HOSPITAL_COMMUNITY)

## 2024-01-24 ENCOUNTER — Other Ambulatory Visit (HOSPITAL_COMMUNITY): Payer: Self-pay

## 2024-01-24 ENCOUNTER — Encounter (HOSPITAL_COMMUNITY): Payer: Self-pay | Admitting: *Deleted

## 2024-01-24 DIAGNOSIS — I5023 Acute on chronic systolic (congestive) heart failure: Secondary | ICD-10-CM | POA: Diagnosis not present

## 2024-01-24 LAB — BASIC METABOLIC PANEL WITH GFR
Anion gap: 15 (ref 5–15)
BUN: 83 mg/dL — ABNORMAL HIGH (ref 6–20)
CO2: 24 mmol/L (ref 22–32)
Calcium: 9.2 mg/dL (ref 8.9–10.3)
Chloride: 97 mmol/L — ABNORMAL LOW (ref 98–111)
Creatinine, Ser: 2.22 mg/dL — ABNORMAL HIGH (ref 0.44–1.00)
GFR, Estimated: 25 mL/min — ABNORMAL LOW (ref >60)
Glucose, Bld: 157 mg/dL — ABNORMAL HIGH (ref 70–99)
Potassium: 3.2 mmol/L — ABNORMAL LOW (ref 3.5–5.1)
Sodium: 136 mmol/L (ref 135–145)

## 2024-01-24 LAB — CBC
HCT: 23.8 % — ABNORMAL LOW (ref 36.0–46.0)
Hemoglobin: 7.8 g/dL — ABNORMAL LOW (ref 12.0–15.0)
MCH: 29.4 pg (ref 26.0–34.0)
MCHC: 32.8 g/dL (ref 30.0–36.0)
MCV: 89.8 fL (ref 80.0–100.0)
Platelets: 260 K/uL (ref 150–400)
RBC: 2.65 MIL/uL — ABNORMAL LOW (ref 3.87–5.11)
RDW: 14.8 % (ref 11.5–15.5)
WBC: 6.4 K/uL (ref 4.0–10.5)
nRBC: 0 % (ref 0.0–0.2)

## 2024-01-24 LAB — GLUCOSE, CAPILLARY: Glucose-Capillary: 147 mg/dL — ABNORMAL HIGH (ref 70–99)

## 2024-01-24 MED ORDER — POTASSIUM CHLORIDE CRYS ER 20 MEQ PO TBCR
60.0000 meq | EXTENDED_RELEASE_TABLET | Freq: Every day | ORAL | 1 refills | Status: DC
  Filled 2024-01-24 – 2024-02-17 (×2): qty 90, 30d supply, fill #0

## 2024-01-24 MED ORDER — TORSEMIDE 100 MG PO TABS
100.0000 mg | ORAL_TABLET | Freq: Two times a day (BID) | ORAL | 1 refills | Status: DC
  Filled 2024-01-24 – 2024-02-17 (×2): qty 60, 30d supply, fill #0

## 2024-01-24 MED ORDER — POTASSIUM CHLORIDE CRYS ER 20 MEQ PO TBCR: 40.0000 meq | EXTENDED_RELEASE_TABLET | Freq: Once | ORAL | Status: DC

## 2024-01-24 MED ORDER — POTASSIUM CHLORIDE CRYS ER 20 MEQ PO TBCR
40.0000 meq | EXTENDED_RELEASE_TABLET | ORAL | Status: DC
  Administered 2024-01-24: 40 meq via ORAL
  Filled 2024-01-24: qty 2

## 2024-01-24 MED ORDER — METOLAZONE 5 MG PO TABS
5.0000 mg | ORAL_TABLET | Freq: Once | ORAL | Status: AC
Start: 2024-01-24 — End: 2024-01-24
  Administered 2024-01-24: 5 mg via ORAL
  Filled 2024-01-24: qty 1

## 2024-01-24 NOTE — Discharge Summary (Signed)
 Physician Discharge Summary  Damiyah Ditmars FMW:[id number] DOB: March 10, 1966 DOA: 01/20/2024  PCP: [name], NP  Admit date: 01/20/2024 Discharge date: 01/24/2024  Time spent: 45 minutes  Recommendations for Outpatient Follow-up:  Follow-up in advanced heart failure clinic on 10/2 Patient received instructions on Furoscix  administration this afternoon and tomorrow and then resume torsemide  on Sunday 9/28 BMP in 1 week   Discharge Diagnoses:  Principal Problem:   Acute on chronic systolic (congestive) heart failure (HCC) Active Problems:   CAD (coronary artery disease)   Hypertension   Chronic kidney disease, stage 3a (HCC)   Type 2 diabetes mellitus with hyperlipidemia (HCC)   S/P BKA (below knee amputation) unilateral, left (HCC)   PVD (peripheral vascular disease)   GERD (gastroesophageal reflux disease)   Hypothyroidism   Iron  deficiency anemia   Obesity, class 1   Discharge Condition: Improving  Diet recommendation: Diabetic, heart healthy  Filed Weights   01/22/24 0125 01/23/24 0515 01/24/24 0443  Weight: 87 kg 86.3 kg 86 kg    History of present illness:  58/F w acute on chronic systolic CHF, PAD sp B/L TMA, 3V CAD, type 2 diabetes, hypertension, hyperlipidemia, chronic anemia secondary to GI bleeds from AVMs admitted from heart failure clinic with severe volume overload. Workup in the ER significant for creatinine of 2.5, chest x-ray with diffuse bilateral pulmonary edema/pleural effusions and EKG with normal sinus rhythm with frequent PVCs   Hospital Course:   Acute on chronic systolic CHF -Echo with EF 25%, moderate mitral regurgitation, positive aortic stenosis.  - Some response to high-dose IV Lasix  and metolazone  -Still significantly volume overloaded but adamant to leave, heart failure team following, recommended Furoscix  this afternoon and 2 doses tomorrow, followed by resumption of torsemide  100 mg twice daily from Sunday -continue  midodrine  -GDMT limited by hypotension/CKD -She has close follow-up in heart failure clinic on Tuesday   Mod to severe AS -structural heart team foll for TAVR down the road   3V CAD (coronary artery disease) PAD High sensitive troponin elevation due to heart failure exacerbation with volume overload. CABG has been recommended but now on med mgt due to ongoing med issues Continue aspirin , clopidogrel  and statin therapy   Chronic kidney disease, stage 4 - Stable, monitor with diuresis   Type 2 diabetes mellitus with hyperlipidemia (HCC) Continue glucose cover and monitoring with insulin  sliding scale.  Continue statin    S/P BKA (below knee amputation) unilateral, left (HCC) Follow up as outpatient   PVD (peripheral vascular disease) Continue dual antiplatelet therapy and statin    GERD (gastroesophageal reflux disease) Continue with pantoprazole    Iron  deficiency anemia Known AVMs Hemoglobin down to 6.7 on 9/24, no overt bleeding Transfused 1 unit PRBC, given IV iron , hemoglobin improved to 7.5-8 range   Hypothyroidism Continue with levothyroxine     Obesity, class 1 Calculated BMI is 32.8    Consultations: Heart failure team  Discharge Exam: Vitals:   01/24/24 0443 01/24/24 0735  BP: 108/64 108/65  Pulse: 83   Resp: 18   Temp: 98.6 F (37 C) 98.5 F (36.9 C)  SpO2: 98%    Gen: Awake, Alert, Oriented X 3,  HEENT: + JVD Lungs: Good air movement bilaterally, CTAB CVS: S1S2/RRR, systolic murmur Abd: soft, Non tender, non distended, BS present Extremities: 1+ edema Skin: no new rashes on exposed skin   Discharge Instructions   Discharge Instructions     Diet - low sodium heart healthy   Complete by: As directed  Discharge wound care:   Complete by: As directed    Clean left stump wound with Vashe a wound cleanser do not rinse and allow to air dry, apply Vashe a moistened gauze to wound beds daily, cover with dry gauze and secure with Kerlix roll  gauze or silicone foam   Increase activity slowly   Complete by: As directed       Allergies as of 01/24/2024       Reactions   Trental  [pentoxifylline ] Nausea And Vomiting   Vibramycin [doxycycline] Nausea And Vomiting        Medication List     TAKE these medications    aspirin  EC 81 MG tablet Take 1 tablet (81 mg total) by mouth daily. What changed: when to take this   atorvastatin  40 MG tablet Commonly known as: LIPITOR Take 40 mg by mouth daily.   clopidogrel  75 MG tablet Commonly known as: PLAVIX  Take 1 tablet (75 mg total) by mouth daily.   ferrous sulfate  325 (65 FE) MG EC tablet Take 1 tablet (325 mg total) by mouth in the morning and at bedtime. What changed:  how much to take additional instructions   gabapentin  300 MG capsule Commonly known as: NEURONTIN  Take 300 mg by mouth at bedtime.   levothyroxine  150 MCG tablet Commonly known as: SYNTHROID  Take 150 mcg by mouth daily before breakfast.   midodrine  5 MG tablet Commonly known as: PROAMATINE  Take 1 tablet (5 mg total) by mouth 3 (three) times daily with meals.   nitroGLYCERIN  0.4 MG SL tablet Commonly known as: Nitrostat  Place 1 tablet (0.4 mg total) under the tongue every 5 (five) minutes x 3 doses as needed for chest pain (if no relief after 3rd dose proceed to ED or call 911). 11/06/2022-New   OXYGEN  Inhale 2 L into the lungs continuous.   pantoprazole  40 MG tablet Commonly known as: PROTONIX  Take 40 mg by mouth daily.   polyethylene glycol 17 g packet Commonly known as: MIRALAX  / GLYCOLAX  Take 17 g by mouth 2 (two) times daily.   potassium chloride  SA 20 MEQ tablet Commonly known as: KLOR-CON  M Take 3 tablets (60 mEq total) by mouth daily. Start taking on: January 26, 2024 What changed:  how much to take These instructions start on January 26, 2024. If you are unsure what to do until then, ask your doctor or other care provider.   promethazine  25 MG tablet Commonly known  as: PHENERGAN  Take 1 tablet (25 mg total) by mouth every 6 (six) hours as needed for nausea or vomiting.   torsemide  20 MG tablet Commonly known as: DEMADEX  Take 5 tablets (100 mg total) by mouth 2 (two) times daily. Start taking on: January 26, 2024 What changed:  how much to take These instructions start on January 26, 2024. If you are unsure what to do until then, ask your doctor or other care provider.   Tresiba  FlexTouch 100 UNIT/ML FlexTouch Pen Generic drug: insulin  degludec Inject 6 Units into the skin at bedtime. What changed: how much to take   ZzzQuil 25 MG Caps Generic drug: diphenhydrAMINE  HCl (Sleep) Take 25 mg by mouth at bedtime.               Discharge Care Instructions  (From admission, onward)           Start     Ordered   01/24/24 0000  Discharge wound care:       Comments: Clean left stump wound with Vashe  a wound cleanser do not rinse and allow to air dry, apply Vashe a moistened gauze to wound beds daily, cover with dry gauze and secure with Kerlix roll gauze or silicone foam   01/24/24 0954           Allergies  Allergen Reactions   Trental  [Pentoxifylline ] Nausea And Vomiting   Vibramycin [Doxycycline] Nausea And Vomiting      The results of significant diagnostics from this hospitalization (including imaging, microbiology, ancillary and laboratory) are listed below for reference.    Significant Diagnostic Studies: ECHOCARDIOGRAM LIMITED Result Date: 01/21/2024    ECHOCARDIOGRAM LIMITED REPORT   Patient Name:   [facility] Schmitt [facility] Date of Exam: 01/21/2024 Medical Rec #:  [id number]              Height:       64.0 in Accession #:    [id number]             Weight:       191.6 lb Date of Birth:  04-22-1966               BSA:          1.921 m Patient Age:    58 years               BP:           102/69 mmHg Patient Gender: F                      HR:           95 bpm. Exam Location:  Inpatient Procedure: Limited Echo and Intracardiac  Opacification Agent (Both Spectral and            Color Flow Doppler were utilized during procedure). Indications:    Elevated Troponin  History:        Patient has prior history of Echocardiogram examinations, most                 recent 11/20/2023. CHF, Previous Myocardial Infarction and CAD,                 Signs/Symptoms:Chest Pain; Risk Factors:Diabetes, Hypertension                 and Former Smoker.  Sonographer:    Juliene Rucks Referring Phys: [id number] CAROLE N HALL IMPRESSIONS  1. Left ventricular ejection fraction, by estimation, is 25%. The left ventricle has severely decreased function. The left ventricle demonstrates global hypokinesis. Left ventricular diastolic function could not be evaluated- limited study.  2. Large pleural effusion in both left and right lateral regions. There is secondary compressive atelectassis seen in some views.  3. The mitral valve is abnormally calcified. 2D MVA 2.86 cm2. Full MR assessment not performed; at least moderate mitral regurgitation. The mitral valve is abnormal. Moderate mitral valve regurgitation.  4. Aortic valve is visually calcified; Full AS or AI assesssment not performed. Mild, central aortic regurgitation based on PLAX assessment. Aortic valve regurgitation is mild. Comparison(s): Prior images reviewed side by side. Mitral regurgitation has increased. Pleural effusions are new. FINDINGS  Left Ventricle: Left ventricular ejection fraction, by estimation, is 25%. The left ventricle has severely decreased function. The left ventricle demonstrates global hypokinesis. The left ventricular internal cavity size was normal in size. There is no left ventricular hypertrophy. Left ventricular diastolic function could not be evaluated. Left ventricular diastolic function could not be evaluated due to mitral regurgitation (moderate or greater).  Mitral Valve: The mitral valve is abnormally calcified. 2D MVA 2.86 cm2. Full MR assessment not performed; at least moderate  mitral regurgitation. The mitral valve is abnormal. Moderate mitral valve regurgitation. Tricuspid Valve: Tricuspid valve regurgitation is mild. Aortic Valve: Aortic valve is visually calcified; Full AS or AI assesssment not performed. Mild, central aortic regurgitation based on PLAX assessment. Aortic valve regurgitation is mild. Pulmonic Valve: Pulmonic valve regurgitation is mild. Aorta: The aortic root and ascending aorta are structurally normal, with no evidence of dilitation. Additional Comments: There is a large pleural effusion in both left and right lateral regions.  LEFT VENTRICLE PLAX 2D LVIDd:         5.42 cm LVIDs:         4.55 cm LV PW:         1.16 cm LV IVS:        1.06 cm LVOT diam:     1.78 cm LVOT Area:     2.49 cm  LEFT ATRIUM         Index LA diam:    3.81 cm 1.98 cm/m   AORTA Ao Root diam: 2.63 cm Ao Asc diam:  2.89 cm  SHUNTS Systemic Diam: 1.78 cm Stanly Leavens MD Electronically signed by Stanly Leavens MD Signature Date/Time: 01/21/2024/10:28:08 AM    Final    DG Chest 2 View Result Date: 01/20/2024 CLINICAL DATA:  Shortness of breath. EXAM: CHEST - 2 VIEW COMPARISON:  12/15/2023 and CT chest 08/18/2022. FINDINGS: Trachea is midline. Heart size is grossly stable. Diffuse airspace opacification with bilateral pleural effusions. IMPRESSION: Congestive heart failure. Electronically Signed   By: Newell Eke M.D.   On: 01/20/2024 16:09   CARDIAC CATHETERIZATION Result Date: 01/17/2024   Mid LM to Dist LM lesion is 40% stenosed.   Ost LAD to Prox LAD lesion is 75% stenosed.   Mid LAD lesion is 70% stenosed.   Ost Cx to Prox Cx lesion is 100% stenosed.   Mid RCA lesion is 70% stenosed.   1st Diag lesion is 25% stenosed. 1.  Multivessel disease relatively unchanged versus prior study with occluded left circumflex, high-grade mid LAD, and moderate right coronary artery disease.  Given the lack of exertional angina, medical therapy will be pursued. 2.  Fick cardiac output of  6.7 L/min and Fick cardiac index of 3.6 L/min/m with the following hemodynamics:  Right atrial pressure mean of 8 mmHg  RV 58/6 with an end-diastolic pressure of 12 mmHg  PA 53/29 with a mean of 40 mmHg  Wedge pressure mean of 29 mmHg with V waves to 44 mmHg  PVR of 1.6 Woods units  PA pulsatility index of 3 3.  Left femoral access, which is the side of the patient's BKA, looks to be approachable if TAVR is to be pursued. Recommendation: Judicious IV fluids given CKD, continue evaluation for aortic valve intervention.  Discharge later today.   CT RENAL STONE STUDY Result Date: 12/27/2023 CLINICAL DATA:  Gross hematuria EXAM: CT ABDOMEN AND PELVIS WITHOUT CONTRAST TECHNIQUE: Multidetector CT imaging of the abdomen and pelvis was performed following the standard protocol without IV contrast. RADIATION DOSE REDUCTION: This exam was performed according to the departmental dose-optimization program which includes automated exposure control, adjustment of the mA and/or kV according to patient size and/or use of iterative reconstruction technique. COMPARISON:  Renal ultrasound 11/29/2023; CT abdomen pelvis 03/22/2023 FINDINGS: Lower chest: Normal heart size. Trace pericardial effusion. Small bilateral pleural effusions, right greater than left. Patchy subpleural consolidative  opacities bilaterally, nonspecific, potentially atelectasis. Additionally there are scattered patchy ground-glass opacities within the visualized lower lungs bilaterally. Hepatobiliary: Mild subtle hepatic contour nodularity. Stones/sludge in the gallbladder lumen. Gallbladder is decompressed. No biliary ductal dilatation. Pancreas: Unremarkable Spleen: Spleen is enlarged measuring 14.6 cm. Adrenals/Urinary Tract: Adrenal glands are unremarkable. Symmetric renal size. No hydronephrosis. No nephrolithiasis. Bilateral renal arterial calcifications. Perinephric fluid. Urinary bladder is grossly unremarkable. Stomach/Bowel: No abnormal bowel wall  thickening or evidence for bowel obstruction. No free fluid or free intraperitoneal air. Vascular/Lymphatic: Normal caliber abdominal aorta. Extensive peripheral calcified atherosclerotic plaque. No retroperitoneal lymphadenopathy. Reproductive: Uterus and bilateral adnexa are unremarkable. Other: None Musculoskeletal: Lower thoracic and lumbar spine degenerative changes. No aggressive or acute appearing osseous lesions. IMPRESSION: 1. No nephrolithiasis or hydronephrosis. 2. Small bilateral pleural effusions, right greater than left. 3. Patchy subpleural consolidative opacities within the visualized lower lungs bilaterally, nonspecific, potentially atelectasis. Additionally there are scattered patchy ground-glass opacities within the visualized lower lungs bilaterally. Findings may represent edema or be infectious/inflammatory in etiology. 4. Subtle hepatic contour nodularity as can be seen with cirrhosis. 5. Splenomegaly. 6. Cholelithiasis. Electronically Signed   By: Bard Moats M.D.   On: 12/27/2023 14:07    Microbiology: No results found for this or any previous visit (from the past 240 hours).   Labs: Basic Metabolic Panel: Recent Labs  Lab 01/20/24 1501 01/21/24 0659 01/22/24 0239 01/23/24 0210 01/24/24 0259  NA 136 137 139 137 136  K 4.1 3.8 3.2* 3.7 3.2*  CL 102 100 101 101 97*  CO2 19* 23 24 21* 24  GLUCOSE 185* 171* 161* 139* 157*  BUN 99* 102* 98* 89* 83*  CREATININE 2.49* 2.42* 2.30* 2.13* 2.22*  CALCIUM  9.1 9.3 9.2 9.2 9.2  MG  --  2.3 2.2  --   --   PHOS  --  6.2*  --   --   --    Liver Function Tests: No results for input(s): AST, ALT, ALKPHOS, BILITOT, PROT, ALBUMIN  in the last 168 hours. No results for input(s): LIPASE, AMYLASE in the last 168 hours. No results for input(s): AMMONIA in the last 168 hours. CBC: Recent Labs  Lab 01/20/24 1501 01/21/24 0659 01/22/24 0239 01/22/24 1802 01/23/24 0210 01/23/24 1202 01/24/24 0259  WBC 10.0 6.1 6.0   --  7.2  --  6.4  HGB 8.2* 7.0* 6.7* 9.2* 8.0* 8.6* 7.8*  HCT 26.4* 22.2* 21.2* 28.4* 25.0* 26.2* 23.8*  MCV 92.3 91.0 91.0  --  91.9  --  89.8  PLT 268 258 254  --  274  --  260   Cardiac Enzymes: No results for input(s): CKTOTAL, CKMB, CKMBINDEX, TROPONINI in the last 168 hours. BNP: BNP (last 3 results) Recent Labs    12/15/23 0959 01/03/24 1448 01/20/24 1501  BNP 1,108.0* 1,131.9* 1,011.9*    ProBNP (last 3 results) No results for input(s): PROBNP in the last 8760 hours.  CBG: Recent Labs  Lab 01/23/24 0551 01/23/24 1057 01/23/24 1555 01/23/24 2132 01/24/24 0607  GLUCAP 148* 235* 176* 239* 147*       Signed:  Sigurd Pac MD.  [facility] 01/24/2024, 9:54 AM

## 2024-01-24 NOTE — Progress Notes (Signed)
 Provided patient education on Furoscix  using demo kits and Furoscix  video, QR code provided on AVS for further viewing. Furoscix  order submitted online, ov note and ins card uploaded to Furoscix  Direct. Spoke with patient and her mother. Education provided at bedside.

## 2024-01-24 NOTE — Progress Notes (Addendum)
 Advanced Heart Failure Rounding Note  Cardiologist: Vishnu SHAUNNA Maywood, MD  Chief Complaint: A/C HFrEF Subjective:    -2.6L UOP yesterday. Down 1lb.    Mom at bedside.   No complaints. Sitting on EOB eating breakfast. She reports that she doesn't care what number the ReDS clip shows, she's out of here today.   Objective:   Weight Range: 86 kg Body mass index is 32.54 kg/m.   Vital Signs:   Temp:  [97.7 F (36.5 C)-98.6 F (37 C)] 98.5 F (36.9 C) (09/26 0735) Pulse Rate:  [80-87] 83 (09/26 0443) Resp:  [18-20] 18 (09/26 0443) BP: (95-114)/(62-80) 108/65 (09/26 0735) SpO2:  [97 %-100 %] 98 % (09/26 0443) Weight:  [86 kg] 86 kg (09/26 0443) Last BM Date : 01/20/24  Weight change: Filed Weights   01/22/24 0125 01/23/24 0515 01/24/24 0443  Weight: 87 kg 86.3 kg 86 kg    Intake/Output:   Intake/Output Summary (Last 24 hours) at 01/24/2024 0749 Last data filed at 01/24/2024 0446 Gross per 24 hour  Intake 480 ml  Output 2600 ml  Net -2120 ml    Physical Exam   General:  chronically ill appearing, sitting on EOB.  No respiratory difficulty Neck: JVD ~10 cm.  Cor: Regular rate & rhythm. No murmurs. Lungs: diminished bases Extremities: trace RLE edema. L BKA.  Neuro: alert & oriented x 3. Affect pleasant.   Telemetry   NSR 90s 13-19 PVCs/hr (Personally reviewed)    Labs    CBC Recent Labs    01/23/24 0210 01/23/24 1202 01/24/24 0259  WBC 7.2  --  6.4  HGB 8.0* 8.6* 7.8*  HCT 25.0* 26.2* 23.8*  MCV 91.9  --  89.8  PLT 274  --  260   Basic Metabolic Panel Recent Labs    90/75/74 0239 01/23/24 0210 01/24/24 0259  NA 139 137 136  K 3.2* 3.7 3.2*  CL 101 101 97*  CO2 24 21* 24  GLUCOSE 161* 139* 157*  BUN 98* 89* 83*  CREATININE 2.30* 2.13* 2.22*  CALCIUM  9.2 9.2 9.2  MG 2.2  --   --    Liver Function Tests No results for input(s): AST, ALT, ALKPHOS, BILITOT, PROT, ALBUMIN  in the last 72 hours. No results for input(s):  LIPASE, AMYLASE in the last 72 hours. Cardiac Enzymes No results for input(s): CKTOTAL, CKMB, CKMBINDEX, TROPONINI in the last 72 hours.  BNP: BNP (last 3 results) Recent Labs    12/15/23 0959 01/03/24 1448 01/20/24 1501  BNP 1,108.0* 1,131.9* 1,011.9*    ProBNP (last 3 results) No results for input(s): PROBNP in the last 8760 hours.   D-Dimer No results for input(s): DDIMER in the last 72 hours. Hemoglobin A1C No results for input(s): HGBA1C in the last 72 hours. Fasting Lipid Panel No results for input(s): CHOL, HDL, LDLCALC, TRIG, CHOLHDL, LDLDIRECT in the last 72 hours. Thyroid  Function Tests No results for input(s): TSH, T4TOTAL, T3FREE, THYROIDAB in the last 72 hours.  Invalid input(s): FREET3  Other results:   Imaging    No results found.   Medications:     Scheduled Medications:  aspirin  EC  81 mg Oral Daily   atorvastatin   40 mg Oral Daily   clopidogrel   75 mg Oral Daily   enoxaparin  (LOVENOX ) injection  30 mg Subcutaneous QHS   ferrous sulfate   325 mg Oral Q breakfast   gabapentin   300 mg Oral QHS   insulin  aspart  0-5 Units Subcutaneous QHS  insulin  aspart  0-9 Units Subcutaneous TID WC   levothyroxine   150 mcg Oral QAC breakfast   midodrine   5 mg Oral TID WC   pantoprazole   40 mg Oral Daily    Infusions:  furosemide  160 mg (01/23/24 1803)    PRN Medications: acetaminophen , melatonin, polyethylene glycol, prochlorperazine     Patient Profile   58 y.o. 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    Admitted with A/C HFrEF.   Assessment/Plan  A/C HFrEF, ICM  EF has been  20-25% for last 3-4 months .  - Most recent cath last week-  RA 8, PAP 53/29 (40), PCWP 29 w/ v waves to 44 mmHg, CO 6.7 L/min and Fick cardiac index of 3.6 L/min/m, PVR 1.6 WU, PAPi 3.  -  Volume difficult to gauge on exam. Continue IV diuresis with 160 IV BID. ReDS clip pending this morning. Plan for possible discharge with DAMP-HF trial once ReDS reading ~45-50. Eager to go home. Primary team aware. Repeat metolazone  5 mg x1.   GDMT limited by hypotension/CKD.  No SGLT2i with UTIs.  On midodrine  5 mg three times a day.    Elevated Troponin   Multivessel CAD. CABG had been recommended but managed medically due to other active medical issues - Repeat LHC done 9/25 for TAVR w/u. CAD stable from prior study. No chest pain. Suspect demand ischemia   - Continue DAPT ASA+ Plavix  and statin    CKD Stage IIIa Recent AKIs in setting of volume overload/low-output>>Started on CVVH 11/16/23, stopped 11/19/23, followed by recurrent AKI 2/2 bladder outlet obstruction from extensive clots in bladder.  S/p cystoscopy and clot removal    Anemia HX of AVMs Got 1uPRBC 9/24. No obvious bleeding source.  Hgb 8.6>7.8   Hypothyroidism.  - On Levothyroxine .    PVC Frequent PVCs on tele. Consider adding mexitil.  - More frequent on tele review today   PAD HX SFA Stents  On aspirin /plavix /statin    Valvular Heart Disease  Mild to moderate MR and moderate to severe AS on echo 6/25 - Repeat echo 7/25 with extremely poor windows, based on exam expect AS moderate-severe - Echo 11/20/23 with Vmax 3.21, DVT 0.26, mod to severe AS, mG 22 - Structural Heart Team following for potential TAVR.   Plan for discharge today. Discussed with primary team. Will arrange close f/u in AHF next week (scheduled for 01/30/24)  AHF meds at discharge:  Day 1 (9/26) FUROSCIX  80 mg once daily (PM)  via on body infuser + KDUR 40 mEq Day 2 (9/27) FUROSCIX  80 mg twice daily (AM and PM) via on body infuser+ KDUR 40 mEq BID  Torsemide  100 mg BID to start 9/28 KDUR 60 mg daily to start 9/28 ASA 81 mg daily Atorvastatin  40 mg daily Plavix  75 mg daily Midodrine  5 mg TID   FUROSCIX  prescribed  Patient viewed  patient education video with QR code for FUROSCIX    QR code for FUROSCIX  placed on AVS  Call FUROSCIX  Direct at [phone] for questions regarding on body infuser.   Length of Stay: 4  Beckey LITTIE Coe, NP  01/24/2024, 7:49 AM  Advanced Heart Failure Team Pager [phone] (M-F; 7a - 5p)  Please contact [facility] for night-coverage after hours (5p -7a ) and weekends on [email or web address]

## 2024-01-24 NOTE — Plan of Care (Signed)
 Problem: Education: Goal: Ability to describe self-care measures that may prevent or decrease complications (Diabetes Survival Skills Education) will improve Outcome: Progressing Goal: Individualized Educational Video(s) Outcome: Progressing   Problem: Coping: Goal: Ability to adjust to condition or change in health will improve Outcome: Progressing   Problem: Fluid Volume: Goal: Ability to maintain a balanced intake and output will improve Outcome: Progressing   Problem: Health Behavior/Discharge Planning: Goal: Ability to identify and utilize available resources and services will improve Outcome: Progressing Goal: Ability to manage health-related needs will improve Outcome: Progressing   Problem: Metabolic: Goal: Ability to maintain appropriate glucose levels will improve Outcome: Progressing   Problem: Nutritional: Goal: Maintenance of adequate nutrition will improve Outcome: Progressing Goal: Progress toward achieving an optimal weight will improve Outcome: Progressing   Problem: Skin Integrity: Goal: Risk for impaired skin integrity will decrease Outcome: Progressing   Problem: Tissue Perfusion: Goal: Adequacy of tissue perfusion will improve Outcome: Progressing   Problem: Education: Goal: Knowledge of General Education information will improve Description: Including pain rating scale, medication(s)/side effects and non-pharmacologic comfort measures Outcome: Progressing   Problem: Health Behavior/Discharge Planning: Goal: Ability to manage health-related needs will improve Outcome: Progressing   Problem: Clinical Measurements: Goal: Ability to maintain clinical measurements within normal limits will improve Outcome: Progressing Goal: Will remain free from infection Outcome: Progressing Goal: Diagnostic test results will improve Outcome: Progressing Goal: Respiratory complications will improve Outcome: Progressing Goal: Cardiovascular complication will  be avoided Outcome: Progressing   Problem: Activity: Goal: Risk for activity intolerance will decrease Outcome: Progressing   Problem: Nutrition: Goal: Adequate nutrition will be maintained Outcome: Progressing   Problem: Coping: Goal: Level of anxiety will decrease Outcome: Progressing   Problem: Elimination: Goal: Will not experience complications related to bowel motility Outcome: Progressing Goal: Will not experience complications related to urinary retention Outcome: Progressing   Problem: Pain Managment: Goal: General experience of comfort will improve and/or be controlled Outcome: Progressing   Problem: Safety: Goal: Ability to remain free from injury will improve Outcome: Progressing   Problem: Skin Integrity: Goal: Risk for impaired skin integrity will decrease Outcome: Progressing   Problem: Education: Goal: Ability to describe self-care measures that may prevent or decrease complications (Diabetes Survival Skills Education) will improve Outcome: Progressing Goal: Individualized Educational Video(s) Outcome: Progressing   Problem: Coping: Goal: Ability to adjust to condition or change in health will improve Outcome: Progressing   Problem: Fluid Volume: Goal: Ability to maintain a balanced intake and output will improve Outcome: Progressing   Problem: Health Behavior/Discharge Planning: Goal: Ability to identify and utilize available resources and services will improve Outcome: Progressing Goal: Ability to manage health-related needs will improve Outcome: Progressing   Problem: Metabolic: Goal: Ability to maintain appropriate glucose levels will improve Outcome: Progressing   Problem: Nutritional: Goal: Maintenance of adequate nutrition will improve Outcome: Progressing Goal: Progress toward achieving an optimal weight will improve Outcome: Progressing   Problem: Skin Integrity: Goal: Risk for impaired skin integrity will decrease Outcome:  Progressing   Problem: Tissue Perfusion: Goal: Adequacy of tissue perfusion will improve Outcome: Progressing   Problem: Education: Goal: Knowledge of General Education information will improve Description: Including pain rating scale, medication(s)/side effects and non-pharmacologic comfort measures Outcome: Progressing   Problem: Health Behavior/Discharge Planning: Goal: Ability to manage health-related needs will improve Outcome: Progressing   Problem: Clinical Measurements: Goal: Ability to maintain clinical measurements within normal limits will improve Outcome: Progressing Goal: Will remain free from infection Outcome: Progressing Goal: Diagnostic test results will  improve Outcome: Progressing Goal: Respiratory complications will improve Outcome: Progressing Goal: Cardiovascular complication will be avoided Outcome: Progressing   Problem: Activity: Goal: Risk for activity intolerance will decrease Outcome: Progressing   Problem: Nutrition: Goal: Adequate nutrition will be maintained Outcome: Progressing   Problem: Coping: Goal: Level of anxiety will decrease Outcome: Progressing   Problem: Elimination: Goal: Will not experience complications related to bowel motility Outcome: Progressing Goal: Will not experience complications related to urinary retention Outcome: Progressing   Problem: Pain Managment: Goal: General experience of comfort will improve and/or be controlled Outcome: Progressing   Problem: Safety: Goal: Ability to remain free from injury will improve Outcome: Progressing   Problem: Skin Integrity: Goal: Risk for impaired skin integrity will decrease Outcome: Progressing   Problem: Education: Goal: Ability to describe self-care measures that may prevent or decrease complications (Diabetes Survival Skills Education) will improve Outcome: Progressing Goal: Individualized Educational Video(s) Outcome: Progressing   Problem: Coping: Goal:  Ability to adjust to condition or change in health will improve Outcome: Progressing   Problem: Fluid Volume: Goal: Ability to maintain a balanced intake and output will improve Outcome: Progressing   Problem: Health Behavior/Discharge Planning: Goal: Ability to identify and utilize available resources and services will improve Outcome: Progressing Goal: Ability to manage health-related needs will improve Outcome: Progressing   Problem: Metabolic: Goal: Ability to maintain appropriate glucose levels will improve Outcome: Progressing   Problem: Nutritional: Goal: Maintenance of adequate nutrition will improve Outcome: Progressing Goal: Progress toward achieving an optimal weight will improve Outcome: Progressing   Problem: Skin Integrity: Goal: Risk for impaired skin integrity will decrease Outcome: Progressing   Problem: Tissue Perfusion: Goal: Adequacy of tissue perfusion will improve Outcome: Progressing   Problem: Education: Goal: Knowledge of General Education information will improve Description: Including pain rating scale, medication(s)/side effects and non-pharmacologic comfort measures Outcome: Progressing   Problem: Health Behavior/Discharge Planning: Goal: Ability to manage health-related needs will improve Outcome: Progressing   Problem: Clinical Measurements: Goal: Ability to maintain clinical measurements within normal limits will improve Outcome: Progressing Goal: Will remain free from infection Outcome: Progressing Goal: Diagnostic test results will improve Outcome: Progressing Goal: Respiratory complications will improve Outcome: Progressing Goal: Cardiovascular complication will be avoided Outcome: Progressing   Problem: Activity: Goal: Risk for activity intolerance will decrease Outcome: Progressing   Problem: Nutrition: Goal: Adequate nutrition will be maintained Outcome: Progressing   Problem: Coping: Goal: Level of anxiety will  decrease Outcome: Progressing   Problem: Elimination: Goal: Will not experience complications related to bowel motility Outcome: Progressing Goal: Will not experience complications related to urinary retention Outcome: Progressing   Problem: Pain Managment: Goal: General experience of comfort will improve and/or be controlled Outcome: Progressing   Problem: Safety: Goal: Ability to remain free from injury will improve Outcome: Progressing   Problem: Skin Integrity: Goal: Risk for impaired skin integrity will decrease Outcome: Progressing   Problem: Education: Goal: Ability to describe self-care measures that may prevent or decrease complications (Diabetes Survival Skills Education) will improve Outcome: Progressing Goal: Individualized Educational Video(s) Outcome: Progressing   Problem: Coping: Goal: Ability to adjust to condition or change in health will improve Outcome: Progressing   Problem: Fluid Volume: Goal: Ability to maintain a balanced intake and output will improve Outcome: Progressing   Problem: Health Behavior/Discharge Planning: Goal: Ability to identify and utilize available resources and services will improve Outcome: Progressing Goal: Ability to manage health-related needs will improve Outcome: Progressing   Problem: Metabolic: Goal: Ability to maintain appropriate glucose  levels will improve Outcome: Progressing   Problem: Nutritional: Goal: Maintenance of adequate nutrition will improve Outcome: Progressing Goal: Progress toward achieving an optimal weight will improve Outcome: Progressing   Problem: Skin Integrity: Goal: Risk for impaired skin integrity will decrease Outcome: Progressing   Problem: Tissue Perfusion: Goal: Adequacy of tissue perfusion will improve Outcome: Progressing   Problem: Education: Goal: Knowledge of General Education information will improve Description: Including pain rating scale, medication(s)/side effects  and non-pharmacologic comfort measures Outcome: Progressing   Problem: Health Behavior/Discharge Planning: Goal: Ability to manage health-related needs will improve Outcome: Progressing   Problem: Clinical Measurements: Goal: Ability to maintain clinical measurements within normal limits will improve Outcome: Progressing Goal: Will remain free from infection Outcome: Progressing Goal: Diagnostic test results will improve Outcome: Progressing Goal: Respiratory complications will improve Outcome: Progressing Goal: Cardiovascular complication will be avoided Outcome: Progressing   Problem: Activity: Goal: Risk for activity intolerance will decrease Outcome: Progressing   Problem: Nutrition: Goal: Adequate nutrition will be maintained Outcome: Progressing   Problem: Coping: Goal: Level of anxiety will decrease Outcome: Progressing   Problem: Elimination: Goal: Will not experience complications related to bowel motility Outcome: Progressing Goal: Will not experience complications related to urinary retention Outcome: Progressing   Problem: Pain Managment: Goal: General experience of comfort will improve and/or be controlled Outcome: Progressing   Problem: Safety: Goal: Ability to remain free from injury will improve Outcome: Progressing   Problem: Skin Integrity: Goal: Risk for impaired skin integrity will decrease Outcome: Progressing

## 2024-01-24 NOTE — Progress Notes (Signed)
 Reviewed AVS, patient expressed understanding of medications, MD follow up reviewed.   See LDA for information on wounds at discharge. Patient states all belongings brought to the hospital at time of admission are accounted for and packed to take home. Picked up medications from [facility] pharmacy. [facility] contacted to transport patient to entrance A, where family member was waiting in vehicle to transport home.

## 2024-01-24 NOTE — Progress Notes (Signed)
 Medication Samples have been provided to the patient.  Drug name: furoscix        Strength: 80 mg        Qty: 3  LOT: [id number]  Exp.Date: 03/29/2025  Dosing instructions: Take as directed by heart failure clinic  The patient has been instructed regarding the correct time, dose, and frequency of taking this medication, including desired effects and most common side effects.   Kayla Schmitt 10:16 AM 01/24/2024

## 2024-01-24 NOTE — Progress Notes (Signed)
 Furoscix  directions given to patient per Beckey Coe, NP:  Your provider has order Furoscix  for you. This is an on-body infuser that gives you a dose of Furosemide .   It will be shipped to your home from [facility], they will call you before shipping  Ensure you write down the time you start your infusion so that if there is a problem you will know how long the infusion lasted  Use Furoscix  only AS DIRECTED by our office  Dosing Directions:   Day 1= TODAY (Friday 9/26) use 1 kit this afternoon and take 40 meq (2 tabs) of Potassium  Day 2= Tomorrow (Saturday 9/27) Use 1 kit in AM and 1 kit in PM, take 40 meq (2 tabs) of Potassium with each kit  Day 3= Sunday 9/28 START taking Torsemide  100 mg twice daily and Potassium 60 meq (3 tabs) Daily   [facility]up in [facility] on Thursday 01/30/24 at 1:30 PM, parking code [id number]  Please call the clinic for any issues at [phone]

## 2024-01-27 ENCOUNTER — Telehealth: Payer: Self-pay | Admitting: *Deleted

## 2024-01-27 NOTE — Telephone Encounter (Signed)
 Pt was d/c'd from hospital with Furoscix  on Fri 9/26, called to f/u with pt.  She states she successfully used the Furoscix  kit, restarted Torsemide  100 mg BID yesterday and is feeling well. She is aware of f/u appt on Thur 10/2 and states she will be there

## 2024-01-28 LAB — GLUCOSE, CAPILLARY: Glucose-Capillary: 271 mg/dL — ABNORMAL HIGH (ref 70–99)

## 2024-01-29 ENCOUNTER — Telehealth (HOSPITAL_COMMUNITY): Payer: Self-pay

## 2024-01-29 NOTE — Telephone Encounter (Signed)
 Called to confirm/remind patient of their appointment at the [facility] on 01/30/24 1:30.   Appointment:   [] Confirmed  [x] Left mess   [] No answer/No voice mail  [] VM Full/unable to leave message  [] Phone not in service  Patient reminded to bring all medications and/or complete list.  Confirmed patient has transportation. Gave directions, instructed to utilize valet parking.

## 2024-01-29 NOTE — Progress Notes (Incomplete)
 ADVANCED HF CLINIC PROGRESS NOTE   PCP: [name], NP Primary Cardiologist: Diannah SHAUNNA Maywood, MD HF Cardiologist: Dr. Cherrie  Reason for visit: f/u for chronic systolic heart failure   HPI: 58 y.o. 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    Admitted 4/24 with NSTEMI. Cath showed 3V CAD and significant MR. CABG and MVR recommended, but surgery deferred d/t multiple medical issues.   Admitted 6/25 for MRSA bacteremia and necrotizing fasciitis. Required L BKA followed by revision. TEE 6/25 with EF 60-65%, mild to moderate MR and moderate to severe AS, but no evidence of endocarditis. Four week course of IV abx with daptomycin  recommended. Discharged 10/29/23 to CIR for rehab.   Transferred back to [facility] 7/25 with a/c HF, and concern for low-output with acute respiratory failure. She was diuresed with IV lasix , but SCr continued to rise. Arranged for RHC but case aborted d/t significant dyspnea w/ hypoxia and inability to lie flat. She was diuresed and ultimately required intubation for flash pulmonary edema, and started on DBA gtt. Renal function continued to decline and she transiently required CRRT. Eventually underwent RHC showing severely elevated PCWP, mildly elevated RAP, preserved CO. No ischemic eval due to AKI. Course complicated by hematuria, requiring PRBCs. She went to OR for cystoscopy and declotting of bladder. She was able to be extubated, drips weaned and GDMT titrated, however limited by soft BP and creatinine. She was discharged home, weight 184 lbs.  She was referred to structural heart clinic previous visit. Evaluated by Dr. Wendel who ultimately referred her for repeat [facility] for TAVR w/u. Study done 01/17/24 demonstrated multivessel disease relatively unchanged versus prior study with occluded left circumflex, high-grade mid LAD, and moderate right coronary artery disease. RHC RA 8,  PAP 53/29 (40), PCWP 29 w/ v waves to 44 mmHg, CO 6.7 L/min and Fick cardiac index of 3.6 L/min/m, PVR 1.6 WU, PAPi 3.   Admitted 9/25 with A/C HFrEF. Diuresed well with high doses of IV lasix . Received 1uPRBC for IDA with known AMVs. Asymptomatic at discharge despite elevated ReDs. Discharged with 3 doses of Furoscix  and higher PO diuretic dose.   Today she returns for post hospital follow up. Overall feeling ***. Denies palpitations, CP, dizziness, edema, or PND/Orthopnea. *** SOB. Appetite ok. No fever or chills. Weight at home *** pounds. Taking all medications. Denies ETOH, tobacco or drug use.   Cardiac Studies - Zio 9/25:  - RHC (8/25): RA 8, PA 60/25 (40), PCWP mean 29 with prominent v waves to 43, CO/CI (Fick) 5.28/2.77, PVR 2.1 WU - Ltd echo (7/25): EF 25-30%, G2DD, mild to moderate MR, moderate to severe AS - Echo (7/25): EF 25%, RV normal, mild to moderate MR, mild AS - TEE (6/25): LVEF 60-65%, RV normal, no thrombus, moderate to severe AS (mean gradient 27 mmHg, AVA per VTI 1.07 cm) - R/LHC (4/24): severe 3v CAD, severe MR; RA 9, PA 42/24 (32), PCWP 24 with v waves to 45, CO/CI (Fick) 8.58/4.26  Past Medical History:  Diagnosis Date   Anemia    Aortic stenosis    Arthritis    CHF (congestive heart failure) (HCC)    Coronary artery disease    Diabetes mellitus without complication (HCC)    type 2   GERD (gastroesophageal reflux disease)    Heart murmur    History of blood transfusion    Hypercholesteremia  Hypertension    Hypothyroidism    Mitral regurgitation    Myocardial infarction [facility])    Peripheral vascular disease    PONV (postoperative nausea and vomiting)    Thyroid  disease    Current Outpatient Medications  Medication Sig Dispense Refill   aspirin  EC 81 MG tablet Take 1 tablet (81 mg total) by mouth daily. (Patient taking differently: Take 81 mg by mouth at bedtime.) 30 tablet 3   atorvastatin  (LIPITOR) 40 MG tablet Take 40 mg by mouth daily.      clopidogrel  (PLAVIX ) 75 MG tablet Take 1 tablet (75 mg total) by mouth daily. 90 tablet 3   diphenhydrAMINE  HCl, Sleep, (ZZZQUIL) 25 MG CAPS Take 25 mg by mouth at bedtime.     ferrous sulfate  325 (65 FE) MG EC tablet Take 1 tablet (325 mg total) by mouth in the morning and at bedtime. (Patient taking differently: Take 325-650 mg by mouth in the morning and at bedtime. Take one (325mg ) or two tablets (650) once per day in the morning alternating dose strength.) 60 tablet 0   gabapentin  (NEURONTIN ) 300 MG capsule Take 300 mg by mouth at bedtime.     insulin  degludec (TRESIBA  FLEXTOUCH) 100 UNIT/ML FlexTouch Pen Inject 6 Units into the skin at bedtime. (Patient taking differently: Inject 16 Units into the skin at bedtime.)     levothyroxine  (SYNTHROID ) 150 MCG tablet Take 150 mcg by mouth daily before breakfast.     midodrine  (PROAMATINE ) 5 MG tablet Take 1 tablet (5 mg total) by mouth 3 (three) times daily with meals. 90 tablet 11   nitroGLYCERIN  (NITROSTAT ) 0.4 MG SL tablet Place 1 tablet (0.4 mg total) under the tongue every 5 (five) minutes x 3 doses as needed for chest pain (if no relief after 3rd dose proceed to ED or call 911). 11/06/2022-New 25 tablet 3   OXYGEN  Inhale 2 L into the lungs continuous.     pantoprazole  (PROTONIX ) 40 MG tablet Take 40 mg by mouth daily.     polyethylene glycol (MIRALAX  / GLYCOLAX ) 17 g packet Take 17 g by mouth 2 (two) times daily.     potassium chloride  SA (KLOR-CON  M) 20 MEQ tablet Take 3 tablets (60 mEq total) by mouth daily. 90 tablet 1   promethazine  (PHENERGAN ) 25 MG tablet Take 1 tablet (25 mg total) by mouth every 6 (six) hours as needed for nausea or vomiting. 20 tablet 0   torsemide  (DEMADEX ) 100 MG tablet Take 1 tablet (100 mg total) by mouth 2 (two) times daily. 60 tablet 1   No current facility-administered medications for this visit.   Allergies  Allergen Reactions   Trental  [Pentoxifylline ] Nausea And Vomiting   Vibramycin [Doxycycline] Nausea  And Vomiting   Social History   Socioeconomic History   Marital status: Divorced    Spouse name: Not on file   Number of children: 0   Years of education: Not on file   Highest education level: Not on file  Occupational History   Not on file  Tobacco Use   Smoking status: Former    Current packs/day: 0.00    Types: Cigarettes    Quit date: 01/2017    Years since quitting: 7.0   Smokeless tobacco: Never  Vaping Use   Vaping status: Never Used  Substance and Sexual Activity   Alcohol use: Not Currently   Drug use: Yes    Types: Marijuana    Comment: on ocassion- Last time - 11/13/22   Sexual activity:  Yes    Birth control/protection: Post-menopausal  Other Topics Concern   Not on file  Social History Narrative   Not on file   Social Drivers of Health   Financial Resource Strain: Low Risk  (10/19/2023)   Received from [facility]   Overall Financial Resource Strain (CARDIA)    How hard is it for you to pay for the very basics like food, housing, medical care, and heating?: Not hard at all  Food Insecurity: No Food Insecurity (01/21/2024)   Hunger Vital Sign    Worried About Running Out of Food in the Last Year: Never true    Ran Out of Food in the Last Year: Never true  Transportation Needs: No Transportation Needs (01/21/2024)   PRAPARE - Administrator, Civil Service (Medical): No    Lack of Transportation (Non-Medical): No  Physical Activity: Not on file  Stress: Not on file  Social Connections: Unknown (12/15/2023)   Social Connection and Isolation Panel    Frequency of Communication with Friends and Family: Three times a week    Frequency of Social Gatherings with Friends and Family: Three times a week    Attends Religious Services: Not on file    Active Member of Clubs or Organizations: Not on file    Attends Club or Organization Meetings: Not on file    Marital Status: Not on file  Intimate Partner Violence: Not At Risk (01/21/2024)    Humiliation, Afraid, Rape, and Kick questionnaire    Fear of Current or Ex-Partner: No    Emotionally Abused: No    Physically Abused: No    Sexually Abused: No   Family History  Problem Relation Age of Onset   Diabetes Other    CAD Other     Wt Readings from Last 3 Encounters:  01/24/24 86 kg (189 lb 9.5 oz)  01/17/24 86.2 kg (190 lb)  01/03/24 86.2 kg (190 lb)   There were no vitals taken for this visit.  PHYSICAL EXAM: General:  *** appearing.  No respiratory difficulty Neck: JVD *** cm.  Cor: Regular rate & rhythm. No murmurs. Lungs: clear Extremities: no edema  Neuro: alert & oriented x 3. Affect pleasant.  ECG not performed   ReDs reading: *** %, {Norm/abn:16337}   ASSESSMENT & PLAN: Chronic Systolic Heart Failure - TEE 6/25: EF 60-65%, RV okay, mild to mod MR, moderate-severe AS with mG 27 mmHg and AVA 1.07 cm2 - Admitted for mixed shock (CGS and septic) - Echo 11/15/23: EF 25% - RHC (7/25) aborted 2/2 flash pulmonary edema  - Repeat echo with EF 25-30%, G2DD, mild to mod MR, mod-severe AS  - RHC (8/25): severely elevated PCWP, mildly elevated RAP, preserved CO.  - RHC (9/25): severely elevated PCWP 29 mmHg with V waves to 44 mmHg , RA 8, CI 3.6, PAPi 3 - Currently WC Bound w/ increased dyspnea and supp O2 requirements over the weekend, NYHA IIIb.  We considered pushing outpatient diuretics but diuresis would be difficult given her renal fx and low BP. *** Also discussed options w/ patient and she has agreered to go to ED. Recommend starting w/ 80 mg IV BID. Follow BMP daily *** - HF GDMT limited by renal fx and hypotension requiring midodrine  support - Continue midodrine  5 mg tid - No SGLT2i with UTI  2. CAD - NSTEMI in 4/24. Multivessel CAD. CABG had been recommended but managed medically due to other active medical issues - Repeat LHC done 9/25 for  TAVR w/u. CAD stable from prior study. Planning continued medical management given stable disease and lack of  anginal symptoms. - denies CP  - Continue DAPT w/ ASA + Plavix  + atorvastatin  40 mg daily  3. Valvular heart disease - Mild to moderate MR and moderate to severe AS on echo 6/25 - Repeat echo 7/25 with extremely poor windows, based on exam expect AS moderate-severe - Echo 11/20/23 with Vmax 3.21, DVT 0.26, mod to severe AS, mG 22 - Structural Heart Team following for potential TAVR.    4. MRSA bacteremia - Necrotizing fasciitis - S/p recent left BKA 6/25; S/p R TMA in 4/24   5. PAD - hx SFA stents - On aspirin /plavix  + statin.    6. Chronic Anemia - s/p 1 u RBC 11/11/23 - Hx GI bleed from AVMs.  - denies any recent gross bleeding    7. CKD IIIa - recent AKIs in setting of volume overload/low-output>>Started on CVVH 11/16/23, stopped 11/19/23, followed by recurrent AKI 2/2 bladder outlet obstruction from extensive clots in bladder.  S/p cystoscopy and clot removal - most recent SCr baseline 1.6-1.9 - Check BMP today and follow daily w/ IV lasix   - no SGLT2i given h/o UTIs   8. PVCs - Frequent on previous EKGs. Has been asymptomatic  - just completed 2 week Zio to quantify. Results pending  - If high burden, will recommend sleep study for OSA evaluation   Follow up ***   Beckey LITTIE Coe, AGACNP-BC  01/29/24

## 2024-01-30 ENCOUNTER — Inpatient Hospital Stay (HOSPITAL_COMMUNITY): Admit: 2024-01-30

## 2024-02-04 ENCOUNTER — Encounter (HOSPITAL_COMMUNITY): Payer: Self-pay

## 2024-02-04 ENCOUNTER — Ambulatory Visit (HOSPITAL_COMMUNITY): Payer: Self-pay | Admitting: Physician Assistant

## 2024-02-04 ENCOUNTER — Ambulatory Visit (HOSPITAL_COMMUNITY)
Admission: RE | Admit: 2024-02-04 | Discharge: 2024-02-04 | Disposition: A | Discharge status: Home / Self Care | Source: Ambulatory Visit | Attending: Physician Assistant | Admitting: Physician Assistant

## 2024-02-04 VITALS — BP 118/78 | HR 72 | Ht 64.0 in | Wt 189.5 lb

## 2024-02-04 DIAGNOSIS — Z7982 Long term (current) use of aspirin: Secondary | ICD-10-CM | POA: Diagnosis not present

## 2024-02-04 DIAGNOSIS — I5022 Chronic systolic (congestive) heart failure: Secondary | ICD-10-CM | POA: Diagnosis not present

## 2024-02-04 DIAGNOSIS — Z79899 Other long term (current) drug therapy: Secondary | ICD-10-CM | POA: Insufficient documentation

## 2024-02-04 DIAGNOSIS — I130 Hypertensive heart and chronic kidney disease with heart failure and stage 1 through stage 4 chronic kidney disease, or unspecified chronic kidney disease: Secondary | ICD-10-CM | POA: Diagnosis not present

## 2024-02-04 DIAGNOSIS — I493 Ventricular premature depolarization: Secondary | ICD-10-CM | POA: Diagnosis not present

## 2024-02-04 DIAGNOSIS — Z89512 Acquired absence of left leg below knee: Secondary | ICD-10-CM | POA: Diagnosis not present

## 2024-02-04 DIAGNOSIS — E785 Hyperlipidemia, unspecified: Secondary | ICD-10-CM | POA: Diagnosis not present

## 2024-02-04 DIAGNOSIS — I252 Old myocardial infarction: Secondary | ICD-10-CM | POA: Diagnosis not present

## 2024-02-04 DIAGNOSIS — E1151 Type 2 diabetes mellitus with diabetic peripheral angiopathy without gangrene: Secondary | ICD-10-CM | POA: Insufficient documentation

## 2024-02-04 DIAGNOSIS — Z833 Family history of diabetes mellitus: Secondary | ICD-10-CM | POA: Insufficient documentation

## 2024-02-04 DIAGNOSIS — N1832 Chronic kidney disease, stage 3b: Secondary | ICD-10-CM | POA: Diagnosis not present

## 2024-02-04 DIAGNOSIS — Z7902 Long term (current) use of antithrombotics/antiplatelets: Secondary | ICD-10-CM | POA: Diagnosis not present

## 2024-02-04 DIAGNOSIS — Z8614 Personal history of Methicillin resistant Staphylococcus aureus infection: Secondary | ICD-10-CM | POA: Diagnosis not present

## 2024-02-04 DIAGNOSIS — Z87891 Personal history of nicotine dependence: Secondary | ICD-10-CM | POA: Diagnosis not present

## 2024-02-04 DIAGNOSIS — Z8249 Family history of ischemic heart disease and other diseases of the circulatory system: Secondary | ICD-10-CM | POA: Insufficient documentation

## 2024-02-04 DIAGNOSIS — D649 Anemia, unspecified: Secondary | ICD-10-CM | POA: Diagnosis not present

## 2024-02-04 DIAGNOSIS — I38 Endocarditis, valve unspecified: Secondary | ICD-10-CM

## 2024-02-04 DIAGNOSIS — E1122 Type 2 diabetes mellitus with diabetic chronic kidney disease: Secondary | ICD-10-CM | POA: Diagnosis not present

## 2024-02-04 DIAGNOSIS — I2510 Atherosclerotic heart disease of native coronary artery without angina pectoris: Secondary | ICD-10-CM | POA: Insufficient documentation

## 2024-02-04 DIAGNOSIS — Z794 Long term (current) use of insulin: Secondary | ICD-10-CM | POA: Diagnosis not present

## 2024-02-04 LAB — CBC
HCT: 25.4 % — ABNORMAL LOW (ref 36.0–46.0)
Hemoglobin: 8 g/dL — ABNORMAL LOW (ref 12.0–15.0)
MCH: 28.4 pg (ref 26.0–34.0)
MCHC: 31.5 g/dL (ref 30.0–36.0)
MCV: 90.1 fL (ref 80.0–100.0)
Platelets: 276 K/uL (ref 150–400)
RBC: 2.82 MIL/uL — ABNORMAL LOW (ref 3.87–5.11)
RDW: 14.7 % (ref 11.5–15.5)
WBC: 8.2 K/uL (ref 4.0–10.5)
nRBC: 0 % (ref 0.0–0.2)

## 2024-02-04 LAB — BASIC METABOLIC PANEL WITH GFR
Anion gap: 13 (ref 5–15)
BUN: 81 mg/dL — ABNORMAL HIGH (ref 6–20)
CO2: 26 mmol/L (ref 22–32)
Calcium: 9.5 mg/dL (ref 8.9–10.3)
Chloride: 98 mmol/L (ref 98–111)
Creatinine, Ser: 1.76 mg/dL — ABNORMAL HIGH (ref 0.44–1.00)
GFR, Estimated: 33 mL/min — ABNORMAL LOW (ref >60)
Glucose, Bld: 162 mg/dL — ABNORMAL HIGH (ref 70–99)
Potassium: 3.8 mmol/L (ref 3.5–5.1)
Sodium: 137 mmol/L (ref 135–145)

## 2024-02-04 LAB — BRAIN NATRIURETIC PEPTIDE: B Natriuretic Peptide: 557.9 pg/mL — ABNORMAL HIGH (ref 0.0–100.0)

## 2024-02-04 MED ORDER — MEXILETINE HCL 150 MG PO CAPS: 150.0000 mg | ORAL_CAPSULE | Freq: Two times a day (BID) | ORAL | 3 refills | Status: AC

## 2024-02-04 MED ORDER — MIDODRINE HCL 2.5 MG PO TABS: 2.5000 mg | ORAL_TABLET | Freq: Two times a day (BID) | ORAL | 3 refills | Status: DC

## 2024-02-04 NOTE — Progress Notes (Addendum)
 ADVANCED HF CLINIC PROGRESS NOTE   PCP: [name], NP Primary Cardiologist: Diannah SHAUNNA Maywood, MD HF Cardiologist: Dr. Cherrie  Reason for visit: chronic systolic heart failure  HPI: 58 y.o. 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    Admitted 4/24 with NSTEMI. Cath showed 3V CAD and significant MR. CABG and MVR recommended, but surgery deferred d/t multiple medical issues.   Admitted 6/25 for MRSA bacteremia and necrotizing fasciitis. Required L BKA followed by revision. TEE 6/25 with EF 60-65%, mild to moderate MR and moderate to severe AS, but no evidence of endocarditis. Four week course of IV abx with daptomycin  recommended. Discharged 10/29/23 to CIR for rehab.   Transferred back to [facility] 7/25 with a/c HF, and concern for low-output with acute respiratory failure. She was diuresed with IV lasix , but SCr continued to rise. Arranged for RHC but case aborted d/t significant dyspnea w/ hypoxia. She required intubation for flash pulmonary edema, and started on inotropic support DBA. Renal function continued to decline and she transiently required CRRT. Eventually underwent RHC showing severely elevated PCWP, mildly elevated RAP, preserved CO. No ischemic eval due to AKI. Course complicated by hematuria, requiring PRBCs. She went to OR for cystoscopy and declotting of bladder. She was able to be extubated, drips weaned and GDMT titrated, however limited by soft BP and creatinine. She was discharged home, weight 184 lbs.  She was referred to structural heart clinic previous visit. Evaluated by Dr. Wendel who ultimately referred her for repeat [facility] for TAVR w/u. R/LHC 01/17/24 demonstrated multivessel disease relatively unchanged versus prior study with occluded left circumflex, high-grade mid LAD, and moderate right coronary artery disease. RHC RA 8, PAP 53/29 (40), PCWP 29 w/ v waves to 44 mmHg, CO 6.7  L/min and Fick cardiac index of 3.6 L/min/m, PVR 1.6 WU, PAPi 3.   Admitted 9/25 with A/C HFrEF. Diuresed well with high doses of IV lasix . Received 1uPRBC for IDA with known AMVs. Asymptomatic at discharge despite elevated ReDs (chronically elevated). Discharged with 3 doses of Furoscix  and higher PO diuretic dose.   Here today for post hospital CHF follow-up. Accompanied by her mother who assists with providing history. No longer using O2 continuously, just at night. Has not been able to weigh at home. Does not have LLE prosthesis yet, waiting for stump wound to heal. Purchasing a wheelchair scale so that she can weigh. Denies dyspnea, orthopnea, PND and lower extremity edema. Taking all medications as prescribed. Used furoscix  post discharge as directed. BP tends to average 100s systolic prior to am midodrine .   Cardiac Studies - Echo (9/25): EF 25%, b/l pleural effusions, at least moderate MR, aortic valve not fully interrogated - RHC (9/25): severely elevated PCWP 29 mmHg with V waves to 44 mmHg , RA 8, CI 3.6, PAPi 3 - RHC (8/25): RA 8, PA 60/25 (40), PCWP mean 29 with prominent v waves to 43, CO/CI (Fick) 5.28/2.77, PVR 2.1 WU - Ltd echo (7/25): EF 25-30%, G2DD, mild to moderate MR, moderate to severe AS - Echo (7/25): EF 25%, RV normal, mild to moderate MR, mild AS - TEE (6/25): LVEF 60-65%, RV normal, no thrombus, moderate to severe AS (mean gradient 27 mmHg, AVA per VTI 1.07 cm) - R/LHC (4/24): severe 3v CAD, severe MR; RA 9, PA 42/24 (32), PCWP 24 with v waves to 45, CO/CI (Fick) 8.58/4.26  Past Medical History:  Diagnosis Date   Anemia    Aortic stenosis    Arthritis    CHF (congestive heart failure) (HCC)    Coronary artery disease    Diabetes mellitus without complication (HCC)    type 2   GERD (gastroesophageal reflux disease)    Heart murmur    History of blood transfusion    Hypercholesteremia    Hypertension    Hypothyroidism    Mitral regurgitation    Myocardial  infarction [facility])    Peripheral vascular disease    PONV (postoperative nausea and vomiting)    Thyroid  disease    Current Outpatient Medications  Medication Sig Dispense Refill   aspirin  EC 81 MG tablet Take 1 tablet (81 mg total) by mouth daily. (Patient taking differently: Take 81 mg by mouth at bedtime.) 30 tablet 3   atorvastatin  (LIPITOR) 40 MG tablet Take 40 mg by mouth daily.     clopidogrel  (PLAVIX ) 75 MG tablet Take 1 tablet (75 mg total) by mouth daily. 90 tablet 3   diphenhydrAMINE  HCl, Sleep, (ZZZQUIL) 25 MG CAPS Take 25 mg by mouth at bedtime.     ferrous sulfate  325 (65 FE) MG EC tablet Take 1 tablet (325 mg total) by mouth in the morning and at bedtime. (Patient taking differently: Take 325-650 mg by mouth in the morning and at bedtime. Take one (325mg ) or two tablets (650) once per day in the morning alternating dose strength.) 60 tablet 0   gabapentin  (NEURONTIN ) 300 MG capsule Take 300 mg by mouth at bedtime.     insulin  degludec (TRESIBA  FLEXTOUCH) 100 UNIT/ML FlexTouch Pen Inject 6 Units into the skin at bedtime. (Patient taking differently: Inject 16 Units into the skin at bedtime.)     levothyroxine  (SYNTHROID ) 150 MCG tablet Take 150 mcg by mouth daily before breakfast.     midodrine  (PROAMATINE ) 5 MG tablet Take 1 tablet (5 mg total) by mouth 3 (three) times daily with meals. 90 tablet 11   nitroGLYCERIN  (NITROSTAT ) 0.4 MG SL tablet Place 1 tablet (0.4 mg total) under the tongue every 5 (five) minutes x 3 doses as needed for chest pain (if no relief after 3rd dose proceed to ED or call 911). 11/06/2022-New 25 tablet 3   OXYGEN  Inhale 2 L into the lungs continuous.     pantoprazole  (PROTONIX ) 40 MG tablet Take 40 mg by mouth daily.     polyethylene glycol (MIRALAX  / GLYCOLAX ) 17 g packet Take 17 g by mouth 2 (two) times daily. (Patient taking differently: Take 17 g by mouth daily as needed.)     potassium chloride  SA (KLOR-CON  M) 20 MEQ tablet Take 3 tablets (60 mEq total)  by mouth daily. 90 tablet 1   promethazine  (PHENERGAN ) 25 MG tablet Take 1 tablet (25 mg total) by mouth every 6 (six) hours as needed for nausea or vomiting. 20 tablet 0   torsemide  (DEMADEX ) 100 MG tablet Take 1 tablet (100 mg total) by mouth 2 (two) times daily. 60 tablet 1   No current facility-administered medications for this encounter.   Allergies  Allergen Reactions   Trental  [Pentoxifylline ] Nausea And Vomiting   Vibramycin [Doxycycline] Nausea And Vomiting   Social History   Socioeconomic History   Marital status: Divorced    Spouse name: Not on file   Number of children: 0   Years of education: Not on file   Highest education level: Not on file  Occupational History   Not on file  Tobacco Use  Smoking status: Former    Current packs/day: 0.00    Types: Cigarettes    Quit date: 01/2017    Years since quitting: 7.0   Smokeless tobacco: Never  Vaping Use   Vaping status: Never Used  Substance and Sexual Activity   Alcohol use: Not Currently   Drug use: Yes    Types: Marijuana    Comment: on ocassion- Last time - 11/13/22   Sexual activity: Yes    Birth control/protection: Post-menopausal  Other Topics Concern   Not on file  Social History Narrative   Not on file   Social Drivers of Health   Financial Resource Strain: Low Risk  (10/19/2023)   Received from [facility]   Overall Financial Resource Strain (CARDIA)    How hard is it for you to pay for the very basics like food, housing, medical care, and heating?: Not hard at all  Food Insecurity: No Food Insecurity (01/21/2024)   Hunger Vital Sign    Worried About Running Out of Food in the Last Year: Never true    Ran Out of Food in the Last Year: Never true  Transportation Needs: No Transportation Needs (01/21/2024)   PRAPARE - Administrator, Civil Service (Medical): No    Lack of Transportation (Non-Medical): No  Physical Activity: Not on file  Stress: Not on file  Social Connections:  Unknown (12/15/2023)   Social Connection and Isolation Panel    Frequency of Communication with Friends and Family: Three times a week    Frequency of Social Gatherings with Friends and Family: Three times a week    Attends Religious Services: Not on file    Active Member of Clubs or Organizations: Not on file    Attends Club or Organization Meetings: Not on file    Marital Status: Not on file  Intimate Partner Violence: Not At Risk (01/21/2024)   Humiliation, Afraid, Rape, and Kick questionnaire    Fear of Current or Ex-Partner: No    Emotionally Abused: No    Physically Abused: No    Sexually Abused: No   Family History  Problem Relation Age of Onset   Diabetes Other    CAD Other     Wt Readings from Last 3 Encounters:  02/04/24 86 kg (189 lb 8 oz)  01/24/24 86 kg (189 lb 9.5 oz)  01/17/24 86.2 kg (190 lb)   BP 118/78   Pulse 72   Ht 5' 4 (1.626 m)   Wt 86 kg (189 lb 8 oz)   SpO2 98%   BMI 32.53 kg/m   PHYSICAL EXAM: General:  NAD. Arrived in wheelchair. Neck: JVP 6-7 cm Cor: Regular rate & rhythm. 3/6 AS murmur with reduced S2. Lungs: diminished Extremities: R TMA, L BKA (left leg in protective boot) Neuro: alert & oriented x 3. Affect pleasant.  ECG SR 73 bpm, anterior Qs  ReDs reading: 42 %, abnormal   ASSESSMENT & PLAN: Chronic Systolic Heart Failure - TEE 6/25: EF 60-65%, RV okay, mild to mod MR, moderate-severe AS with mG 27 mmHg and AVA 1.07 cm2 - Admitted for mixed shock (CGS and septic) - Echo 11/15/23: EF 25% - RHC (7/25) aborted 2/2 flash pulmonary edema  - Repeat echo with EF 25-30%, G2DD, mild to mod MR, mod-severe AS  - RHC (8/25): severely elevated PCWP, mildly elevated RAP, preserved CO.  - RHC (9/25): severely elevated PCWP 29 mmHg with V waves to 44 mmHg , RA 8, CI 3.6, PAPi  3 - Currently NYHA III, confounded by physical deconditioning.   - Volume okay on exam. ReDS 42% (elevated but much lower than prior readings). Continue Torsemide  100 mg  BID. Will give 2 samples of furoscix  kits to have at home as needed. Can also consider PRN metolazone  in the future. - HF GDMT limited by renal fx and hypotension requiring midodrine  support. BP improving. Decrease midodrine  to 2.5 mg TID. Eventually would like to add GDMT but CKD will also limit this. - Not on SGLT2i with hx UTI, does not want to retrial  2. CAD - NSTEMI in 4/24. Multivessel CAD. CABG had been recommended but managed medically due to other active medical issues - Repeat LHC 9/25 for TAVR w/u. CAD stable from prior study. Planning continued medical management given stable disease and lack of anginal symptoms. - No chest pain - Continue DAPT with ASA + plavix  - Continue Atorvastatin   3. Valvular heart disease - Mild to moderate MR and moderate to severe AS on echo 6/25 - Repeat echo 7/25 with extremely poor windows, based on exam expect AS moderate-severe - Echo 11/20/23 moderate to severe AS,. Vmax 3.21 w/ DVI 0.26, mG 22 mmHg - Structural Heart Team following for potential TAVR. Will reach out to structural team coordinator regarding next steps.   4. MRSA bacteremia - Necrotizing fasciitis - S/p recent left BKA 6/25; S/p R TMA in 4/24   5. PAD - hx SFA stents - On aspirin /plavix  + statin.    6. Chronic Anemia - Hx GI bleed from AVMs. ? Heyde syndrome - Required transfusions 7/25 and 9/25 - denies any recent gross bleeding  - CBC today   7. CKD IIIb - recent AKIs in setting of volume overload/low-output>>Started on CVVH 11/16/23, stopped 11/19/23, followed by recurrent AKI 2/2 bladder outlet obstruction from extensive clots in bladder.  S/p cystoscopy and clot removal - most recent SCr baseline low 2s - no SGLT2i as above - Follows with [facility]  8. PVCs - Frequent on previous EKGs. Has been asymptomatic  - 2 week zio 9/25: SR avg rate 82 bpm, SVT runs (longest 1 min 19 seconds), 13% PVC burden - check sleep study - Start mexiletine 150 mg BID  Follow up 2-3 weeks  with APP, high risk for readmission/recurrent decompensation   Osher Oettinger N, PA-C 02/04/24

## 2024-02-04 NOTE — Patient Instructions (Signed)
 Medication Changes:  START Mexiletine 150mg  (1 tab) two times daily  DECREASE Midodrine  2.5mg  (1 tab) two times daily  SAMPLES you have been given 2 boxes of furoscix    Lab Work:  Labs done today, your results will be available in MyChart, we will contact you for abnormal readings.    Testing/Procedures:  Your provider has placed an order for an in facility sleep study. This will be done at [facility]. This facility will call you in order to schedule an appointment.    Follow-Up in: Please follow up with the [facility] in 2-3 weeks with the Advanced Practice Provider.   At the [facility], you and your health needs are our priority. We have a designated team specialized in the treatment of Heart Failure. This Care Team includes your primary Heart Failure Specialized Cardiologist (physician), Advanced Practice Providers (APPs- Physician Assistants and Nurse Practitioners), and Pharmacist who all work together to provide you with the care you need, when you need it.   You may see any of the following providers on your designated Care Team at your next follow up:  Dr. Toribio Fuel Dr. Ezra Shuck Dr. Ria Commander Dr. Odis Brownie Greig Mosses, NP Caffie Shed, [place] [facility] [place], [place] Beckey Coe, NP [place] Lee, NP Tinnie Redman, PharmD   Please be sure to bring in all your medications bottles to every appointment.   Need to Contact Us :  If you have any questions or concerns before your next appointment please send us  a message through [place] or call our office at [phone].    TO LEAVE A MESSAGE FOR THE NURSE SELECT OPTION 2, PLEASE LEAVE A MESSAGE INCLUDING: YOUR NAME DATE OF BIRTH CALL BACK NUMBER REASON FOR CALL**this is important as we prioritize the call backs  YOU WILL RECEIVE A CALL BACK THE SAME DAY AS LONG AS YOU CALL BEFORE 4:00 PM

## 2024-02-04 NOTE — Progress Notes (Signed)
 Medication Samples have been provided to the patient.  Drug name: furoscix        Strength: 80mg         Qty: 2 boxes  LOT: [id number]  Exp.Date: Mar 29 2025  Dosing instructions: take 80mg  daily as directed by the heart failure clinic  The patient has been instructed regarding the correct time, dose, and frequency of taking this medication, including desired effects and most common side effects.   Kayla Schmitt 2:23 PM 02/04/2024

## 2024-02-05 ENCOUNTER — Other Ambulatory Visit: Payer: Self-pay

## 2024-02-05 DIAGNOSIS — I350 Nonrheumatic aortic (valve) stenosis: Secondary | ICD-10-CM

## 2024-02-05 NOTE — Progress Notes (Signed)
 Orders encounter made in error

## 2024-02-08 ENCOUNTER — Other Ambulatory Visit: Payer: Self-pay | Admitting: Vascular Surgery

## 2024-02-10 ENCOUNTER — Ambulatory Visit (INDEPENDENT_AMBULATORY_CARE_PROVIDER_SITE_OTHER): Admitting: Orthopedic Surgery

## 2024-02-10 DIAGNOSIS — Z89512 Acquired absence of left leg below knee: Secondary | ICD-10-CM

## 2024-02-10 DIAGNOSIS — S88112A Complete traumatic amputation at level between knee and ankle, left lower leg, initial encounter: Secondary | ICD-10-CM

## 2024-02-10 DIAGNOSIS — T8781 Dehiscence of amputation stump: Secondary | ICD-10-CM

## 2024-02-10 DIAGNOSIS — Z89439 Acquired absence of unspecified foot: Secondary | ICD-10-CM | POA: Diagnosis not present

## 2024-02-11 ENCOUNTER — Encounter: Payer: Self-pay | Admitting: Orthopedic Surgery

## 2024-02-11 NOTE — Progress Notes (Signed)
 Office Visit Note   Patient: Kayla Schmitt           Date of Birth: 11/17/1965           MRN: [id number] Visit Date: 02/10/2024              Requested by: [name], NP [address] [place],  [place] [address] PCP: [name], NP  Chief Complaint  Patient presents with   Left Leg - Follow-up    10/23/2023 left BKA       HPI: Discussed the use of AI scribe software for clinical note transcription with the patient, who gave verbal consent to proceed.  History of Present Illness Kayla Schmitt is a 58 year old female with left transtibial and right transmetatarsal amputations who presents with persistent wounds on both sites.  She manages the wound with VAS dressing changes and notes improvement on the lateral side, describing it as 'growing together good' and 'not as deep as it was'.  She mentions that the wound is improving and is 'better' than before. She uses dry dressing changes to manage this wound and notes that it is 'still getting better' and 'not as deep as it was'.  She experiences pain during debridement, describing it as 'hurts' and 'ow', but acknowledges that it is a positive sign as she can feel it.     Assessment & Plan: Visit Diagnoses:  1. Dehiscence of amputation stump of right lower extremity (HCC)   2. Below-knee amputation of left lower extremity, initial encounter (HCC)   3. History of transmetatarsal amputation of foot (HCC)     Plan: Assessment and Plan Assessment & Plan Non-healing wounds of left transtibial amputation stump Persistent medial and lateral wounds on the left transtibial stump. Lateral wound shows healthy granulation post-debridement. Medial wound debrided, has extensive fibrinous exudative tissue. Wounds improving but not prosthetic-ready. - Continue VAS dressing changes on the left. - Postpone prosthetic fitting appointment for another month.  Wound of right transmetatarsal amputation  stump Lateral wound over the fifth metatarsal on the right stump. Debridement revealed viable granulation tissue. Ulcer depth 5 mm, diameter 1 cm. Wound improving. - Continue dry dressing changes on the right. - Follow up in the office in four weeks.      Follow-Up Instructions: Return in about 4 weeks (around 03/09/2024).   Ortho Exam  Patient is alert, oriented, no adenopathy, well-dressed, normal affect, normal respiratory effort. Physical Exam EXTREMITIES: Left transtibial amputation with medial and lateral wounds. Left lateral wound with healthy granulation tissue, no exposed bone or tendon, measuring 1x2 cm and 1 cm deep. Left medial wound with extensive fibrinous exudative tissue, measuring 2x3 cm and 1 cm deep. Right transmetatarsal amputation with lateral wound over fifth metatarsal. Right lateral ulcer 1 cm in diameter and 5 mm deep.  After informed consent a 10 blade knife was used to debride the skin and soft tissue back to healthy viable granulation tissue.      Imaging: No results found. No images are attached to the encounter.  Labs: Lab Results  Component Value Date   HGBA1C 5.5 10/28/2023   HGBA1C 6.4 (H) 08/02/2022   HGBA1C 11.0 (H) 01/11/2020   ESRSEDRATE 136 (H) 11/11/2023   ESRSEDRATE 113 (H) 11/04/2023   ESRSEDRATE 136 (H) 01/14/2020   CRP 21.8 (H) 11/11/2023   CRP 7.4 (H) 11/04/2023   CRP 11.3 (H) 01/14/2020   LABURIC 7.7 (H) 02/04/2022   REPTSTATUS 11/17/2023 FINAL 11/15/2023  GRAMSTAIN  11/15/2023    ABUNDANT WBC PRESENT,BOTH PMN AND MONONUCLEAR NO ORGANISMS SEEN    CULT  11/15/2023    RARE Normal respiratory flora-no Staph aureus or Pseudomonas seen Performed at [facility], [address] [place], [place] [address]      Lab Results  Component Value Date   ALBUMIN  3.1 (L) 12/15/2023   ALBUMIN  3.2 (L) 11/30/2023   ALBUMIN  2.8 (L) 11/27/2023    Lab Results  Component Value Date   MG 2.2 01/22/2024   MG 2.3 01/21/2024   MG 2.1  01/03/2024   Lab Results  Component Value Date   VD25OH 50.60 10/31/2023   VD25OH 42.44 10/30/2023    No results found for: PREALBUMIN    Latest Ref Rng & Units 02/04/2024    2:32 PM 01/24/2024    2:59 AM 01/23/2024   12:02 PM  CBC EXTENDED  WBC 4.0 - 10.5 K/uL 8.2  6.4    RBC 3.87 - 5.11 MIL/uL 2.82  2.65    Hemoglobin 12.0 - 15.0 g/dL 8.0  7.8  8.6   HCT 63.9 - 46.0 % 25.4  23.8  26.2   Platelets 150 - 400 K/uL 276  260       There is no height or weight on file to calculate BMI.  Orders:  No orders of the defined types were placed in this encounter.  No orders of the defined types were placed in this encounter.    Procedures: No procedures performed  Clinical Data: No additional findings.  ROS:  All other systems negative, except as noted in the HPI. Review of Systems  Objective: Vital Signs: There were no vitals taken for this visit.  Specialty Comments:  No specialty comments available.  PMFS History: Patient Active Problem List   Diagnosis Date Noted   Acute on chronic systolic (congestive) heart failure (HCC) 12/15/2023   Chronic anemia 12/15/2023   Gross hematuria 11/30/2023   Clot retention of urine 11/30/2023   Advanced care planning/counseling discussion 11/29/2023   Goals of care, counseling/discussion 11/29/2023   Palliative care by specialist 11/29/2023   Chronic kidney disease, stage 3a (HCC) 11/23/2023   Cardiogenic shock (HCC) 11/16/2023   Acute respiratory failure with hypoxia (HCC) 11/13/2023   Anxiety state 11/12/2023   Acute on chronic systolic CHF (congestive heart failure) (HCC) 11/12/2023   Pressure injury of skin 11/11/2023   Unilateral complete BKA, left, sequela 10/29/2023   S/P BKA (below knee amputation) unilateral, left (HCC) 10/29/2023   Necrotizing fasciitis (HCC) 10/22/2023   Gangrene of left foot (HCC) 10/21/2023   MRSA bacteremia 10/21/2023   Sepsis (HCC) 10/20/2023   AKI (acute kidney injury) 10/20/2023    Diffuse pain in left lower extremity 10/20/2023   History of GI bleed 10/20/2023   Elevated troponin 10/20/2023   Hypokalemia 10/20/2023   History of coronary artery disease 10/20/2023   Prolonged QT interval 10/20/2023   AVM (arteriovenous malformation) of small bowel, acquired 04/07/2023   Antiplatelet or antithrombotic long-term use 04/04/2023   Iron  deficiency anemia 04/04/2023   Acute on chronic diastolic heart failure (HCC) 04/03/2023   Osteomyelitis of foot, right, acute (HCC) 12/12/2022   CAD (coronary artery disease) 11/06/2022   Aortic stenosis 11/06/2022   Mitral regurgitation 08/27/2022   NSTEMI (non-ST elevated myocardial infarction) (HCC) 08/18/2022   Acute clinical systolic heart failure (HCC) 08/18/2022   Acute blood loss anemia 08/18/2022   Anemia 08/18/2022   Critical limb ischemia of both lower extremities (HCC) 06/19/2022  Type 2 diabetes mellitus with hyperlipidemia (HCC) 01/28/2020   Hyperlipidemia 01/28/2020   Hypertension 01/28/2020   Hypothyroidism 01/28/2020   Obesity, class 1 01/28/2020   Ulcerated, foot, right, limited to breakdown of skin (HCC)    Cellulitis of right foot    PVD (peripheral vascular disease)    Cellulitis 01/11/2020   Blister of leg 01/06/2020   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/28/2017   Chest pain 10/28/2017   GERD (gastroesophageal reflux disease) 10/28/2017   Past Medical History:  Diagnosis Date   Anemia    Aortic stenosis    Arthritis    CHF (congestive heart failure) (HCC)    Coronary artery disease    Diabetes mellitus without complication (HCC)    type 2   GERD (gastroesophageal reflux disease)    Heart murmur    History of blood transfusion    Hypercholesteremia    Hypertension    Hypothyroidism    Mitral regurgitation    Myocardial infarction (HCC)    Peripheral vascular disease    PONV (postoperative nausea and vomiting)    Thyroid  disease     Family History  Problem Relation Age of Onset    Diabetes Other    CAD Other     Past Surgical History:  Procedure Laterality Date   ABDOMINAL AORTOGRAM W/LOWER EXTREMITY Bilateral 01/14/2020   Procedure: ABDOMINAL AORTOGRAM W/LOWER EXTREMITY;  Surgeon: Gretta Lonni PARAS, MD;  Location: [facility];  Service: Cardiovascular;  Laterality: Bilateral;   ABDOMINAL AORTOGRAM W/LOWER EXTREMITY N/A 07/05/2022   Procedure: ABDOMINAL AORTOGRAM W/LOWER EXTREMITY;  Surgeon: Gretta Lonni PARAS, MD;  Location: [facility];  Service: Cardiovascular;  Laterality: N/A;   ABDOMINAL AORTOGRAM W/LOWER EXTREMITY N/A 08/02/2022   Procedure: ABDOMINAL AORTOGRAM W/LOWER EXTREMITY;  Surgeon: Gretta Lonni PARAS, MD;  Location: [facility];  Service: Cardiovascular;  Laterality: N/A;   ABDOMINAL AORTOGRAM W/LOWER EXTREMITY N/A 08/06/2022   Procedure: ABDOMINAL AORTOGRAM W/LOWER EXTREMITY;  Surgeon: Gretta Lonni PARAS, MD;  Location: [facility];  Service: Cardiovascular;  Laterality: N/A;   AMPUTATION Bilateral 07/20/2022   Procedure: AMPUTATION RIGHT GREAT TOE AND SECOND TOE, AMPUTATION LEFT GREAT TOE;  Surgeon: Harden Jerona GAILS, MD;  Location: [facility] OR;  Service: Orthopedics;  Laterality: Bilateral;   AMPUTATION Bilateral 08/08/2022   Procedure: BILATERAL TRANSMETATARSAL AMPUTATION;  Surgeon: Harden Jerona GAILS, MD;  Location: [facility] OR;  Service: Orthopedics;  Laterality: Bilateral;   AMPUTATION Left 10/22/2023   Procedure: AMPUTATION BELOW KNEE;  Surgeon: Lanis Fonda BRAVO, MD;  Location: [facility] OR;  Service: Vascular;  Laterality: Left;   AMPUTATION TOE     BIOPSY  04/06/2023   Procedure: BIOPSY;  Surgeon: Jacari Elspeth SQUIBB, MD;  Location: [facility] ENDOSCOPY;  Service: Gastroenterology;;   COLONOSCOPY WITH PROPOFOL  N/A 04/06/2023   Procedure: COLONOSCOPY WITH PROPOFOL ;  Surgeon: Mellody Elspeth SQUIBB, MD;  Location: [facility] ENDOSCOPY;  Service: Gastroenterology;  Laterality: N/A;   CYSTOSCOPY WITH STENT PLACEMENT N/A 11/30/2023   Procedure: CYSTOSCOPY, WITH  FULGURATION OF BLEEDING AND CLOT EVACUATION;  Surgeon: Roseann Adine PARAS., MD;  Location: [facility];  Service: Urology;  Laterality: N/A;  CLOT EVACUATION OF BLADDER   ESOPHAGOGASTRODUODENOSCOPY (EGD) WITH PROPOFOL  N/A 04/06/2023   Procedure: ESOPHAGOGASTRODUODENOSCOPY (EGD) WITH PROPOFOL ;  Surgeon: Marnie Elspeth SQUIBB, MD;  Location: [facility] ENDOSCOPY;  Service: Gastroenterology;  Laterality: N/A;   GIVENS CAPSULE STUDY N/A 04/06/2023   Procedure: GIVENS CAPSULE STUDY;  Surgeon: Tifany Elspeth SQUIBB, MD;  Location: [facility] ENDOSCOPY;  Service: Gastroenterology;  Laterality: N/A;   PERIPHERAL VASCULAR ATHERECTOMY Right 01/14/2020   Procedure: PERIPHERAL VASCULAR ATHERECTOMY;  Surgeon: Gretta Lonni PARAS, MD;  Location: [facility] CV LAB;  Service: Cardiovascular;  Laterality: Right;  sfa   PERIPHERAL VASCULAR INTERVENTION Right 01/14/2020   Procedure: PERIPHERAL VASCULAR INTERVENTION;  Surgeon: Gretta Lonni PARAS, MD;  [facility];  Service: Cardiovascular;  Laterality: Right;  sfa stent    PERIPHERAL VASCULAR INTERVENTION  07/05/2022   Procedure: PERIPHERAL VASCULAR INTERVENTION;  Surgeon: Gretta Lonni PARAS, MD;  [facility];  Service: Cardiovascular;;   PERIPHERAL VASCULAR INTERVENTION  08/02/2022   Procedure: PERIPHERAL VASCULAR INTERVENTION;  Surgeon: Gretta Lonni PARAS, MD;  [facility];  Service: Cardiovascular;;   PERIPHERAL VASCULAR INTERVENTION  08/06/2022   Procedure: PERIPHERAL VASCULAR INTERVENTION;  Surgeon: Gretta Lonni PARAS, MD;  Location: [facility] INVASIVE CV LAB;  Service: Cardiovascular;;   PERIPHERAL VASCULAR THROMBECTOMY  08/02/2022   Procedure: PERIPHERAL VASCULAR THROMBECTOMY;  Surgeon: Gretta Lonni PARAS, MD;  [facility];  Service: Cardiovascular;;   REVISION AMPUTATION, BELOW THE KNEE Left 10/23/2023   Procedure: REVISION AMPUTATION, BELOW THE KNEE;  Surgeon: Harden Jerona GAILS, MD;  Location: [facility] OR;  Service: Orthopedics;   Laterality: Left;  REVISION LEFT BELOW KNEE AMPUTATION   RIGHT HEART CATH N/A 11/29/2023   Procedure: RIGHT HEART CATH;  Surgeon: Rolan Ezra RAMAN, MD;  Location: [facility] INVASIVE CV LAB;  Service: Cardiovascular;  Laterality: N/A;   RIGHT HEART CATH AND CORONARY ANGIOGRAPHY N/A 01/17/2024   Procedure: RIGHT HEART CATH AND CORONARY ANGIOGRAPHY;  Surgeon: Wendel Lurena POUR, MD;  [facility];  Service: Cardiovascular;  Laterality: N/A;   RIGHT/LEFT HEART CATH AND CORONARY ANGIOGRAPHY N/A 08/23/2022   Procedure: RIGHT/LEFT HEART CATH AND CORONARY ANGIOGRAPHY;  Surgeon: Dann Candyce RAMAN, MD;  Location: [facility] INVASIVE CV LAB;  Service: Cardiovascular;  Laterality: N/A;   SKIN SPLIT GRAFT Right 03/18/2020   Procedure: SKIN GRAFTING RIGHT FOOT ULCER;  Surgeon: Harden Jerona GAILS, MD;  Location: [facility] OR;  Service: Orthopedics;  Laterality: Right;   STUMP REVISION Right 11/30/2022   Procedure: REVISION RIGHT TRANSMETATARSAL AMPUTATION;  Surgeon: Harden Jerona GAILS, MD;  Location: [facility] OR;  Service: Orthopedics;  Laterality: Right;   TEE WITHOUT CARDIOVERSION N/A 08/27/2022   Procedure: TRANSESOPHAGEAL ECHOCARDIOGRAM;  Surgeon: Hobart Powell BRAVO, MD;  Location: [facility] INVASIVE CV LAB;  Service: Cardiovascular;  Laterality: N/A;   TRANSESOPHAGEAL ECHOCARDIOGRAM (CATH LAB) N/A 10/24/2023   Procedure: TRANSESOPHAGEAL ECHOCARDIOGRAM;  Surgeon: Michele Richardson, DO;  Location: [facility];  Service: Cardiovascular;  Laterality: N/A;   WISDOM TOOTH EXTRACTION     Social History   Occupational History   Not on file  Tobacco Use   Smoking status: Former    Current packs/day: 0.00    Types: Cigarettes    Quit date: 01/2017    Years since quitting: 7.0   Smokeless tobacco: Never  Vaping Use   Vaping status: Never Used  Substance and Sexual Activity   Alcohol use: Not Currently   Drug use: Yes    Types: Marijuana    Comment: on ocassion- Last time - 11/13/22   Sexual activity: Yes    Birth  control/protection: Post-menopausal

## 2024-02-12 ENCOUNTER — Encounter (HOSPITAL_COMMUNITY)

## 2024-02-17 ENCOUNTER — Other Ambulatory Visit (HOSPITAL_COMMUNITY): Payer: Self-pay

## 2024-02-19 ENCOUNTER — Ambulatory Visit (HOSPITAL_COMMUNITY)

## 2024-02-20 ENCOUNTER — Ambulatory Visit (HOSPITAL_COMMUNITY)
Admission: RE | Admit: 2024-02-20 | Discharge: 2024-02-20 | Disposition: A | Discharge status: Home / Self Care | Source: Ambulatory Visit | Attending: Internal Medicine | Admitting: Internal Medicine

## 2024-02-20 ENCOUNTER — Other Ambulatory Visit (HOSPITAL_COMMUNITY): Payer: Self-pay

## 2024-02-20 DIAGNOSIS — I350 Nonrheumatic aortic (valve) stenosis: Secondary | ICD-10-CM | POA: Diagnosis present

## 2024-02-20 MED ORDER — IOHEXOL 350 MG/ML SOLN
100.0000 mL | Freq: Once | INTRAVENOUS | Status: AC | PRN
  Administered 2024-02-20: 100 mL via INTRAVENOUS

## 2024-02-20 NOTE — Progress Notes (Signed)
 Procedure Type: Isolated AVR Perioperative Outcome Estimate % Operative Mortality 6.38% Morbidity & Mortality 23.2% Stroke 1.5% Renal Failure 9.31% Reoperation 3.2% Prolonged Ventilation 20.8% Deep Sternal Wound Infection 0.271% Long Hospital Stay (>14 days) 12.4% [facility] Stay (<6 days)* 21.1%

## 2024-02-21 ENCOUNTER — Encounter: Payer: Self-pay | Admitting: Urology

## 2024-02-21 ENCOUNTER — Ambulatory Visit: Admitting: Urology

## 2024-02-21 VITALS — BP 102/70 | HR 94 | Ht 64.0 in | Wt 189.0 lb

## 2024-02-21 DIAGNOSIS — Z87448 Personal history of other diseases of urinary system: Secondary | ICD-10-CM

## 2024-02-21 DIAGNOSIS — Z09 Encounter for follow-up examination after completed treatment for conditions other than malignant neoplasm: Secondary | ICD-10-CM | POA: Diagnosis not present

## 2024-02-21 DIAGNOSIS — Z87440 Personal history of urinary (tract) infections: Secondary | ICD-10-CM

## 2024-02-21 DIAGNOSIS — Z87898 Personal history of other specified conditions: Secondary | ICD-10-CM

## 2024-02-21 LAB — URINALYSIS, ROUTINE W REFLEX MICROSCOPIC
Bilirubin, UA: NEGATIVE
Glucose, UA: NEGATIVE
Ketones, UA: NEGATIVE
Leukocytes,UA: NEGATIVE
Nitrite, UA: NEGATIVE
Protein,UA: NEGATIVE
RBC, UA: NEGATIVE
Specific Gravity, UA: <=1.005 — AB (ref 1.005–1.030)
Urobilinogen, Ur: 0.2 mg/dL (ref 0.2–1.0)
pH, UA: 5.5 (ref 5.0–7.5)

## 2024-02-21 NOTE — Progress Notes (Signed)
 Assessment: 1. History of gross hematuria   2. History of UTI     Plan: Return to office as needed.  Chief Complaint: Chief Complaint  Patient presents with   Hematuria    HPI: Kayla Schmitt is a 58 y.o. female who presents for continued evaluation of gross hematuria with clot retention. She was seen as a consult in the hospital on 11/30/2023.  She has multiple medical problems and was in the ICU at that time.  She had a prolonged hospitalization from 11/12/2023 - 12/06/2023.  She apparently developed gross hematuria.  Urinalysis from 11/26/2023 showed >50 RBCs, 0-5 WBCs and many bacteria.  No culture was sent.  She was started on CBI which was not functioning.  She was taken to the operating room on 11/30/2023 where she underwent an extensive clot evacuation.  The clot was well-formed requiring resection in order to remove all visible clot material.  She was continued on CBI postoperatively.  Her urine remained clear and the Foley catheter was removed prior to her discharge from the hospital on 12/05/2023. At her visit in 8/25, she reported that her urine was clear for several days after her discharge from the hospital.  She then developed some gross hematuria which resolved.  She had recurrence of the gross hematuria for 2 days and was passing some small clots.  No dysuria or flank pain.  She was seen by her PCP and started on cefuroxime.   Urine culture from 12/12/2023 grew >100 K Enterobacter.  Treated with Ceftin.  CT abdomen and pelvis without contrast from 12/27/2023 showed no renal or ureteral calculi, no hydronephrosis, and a grossly unremarkable urinary bladder.  Cystoscopy from 9/425 showed several areas on the bladder base and posterior bladder wall with fibrinous changes consistent with prior mucosal trauma from her Foley catheters and clot evacuation. Urine cytology was negative for malignancy.  Fungal spores and hyphae were noted.  She returns today for follow-up.  She is  doing well from a urologic standpoint.  She has not had further episodes of gross hematuria.  No dysuria or flank pain.    Portions of the above documentation were copied from a prior visit for review purposes only.  Allergies: Allergies  Allergen Reactions   Trental  [Pentoxifylline ] Nausea And Vomiting   Vibramycin [Doxycycline] Nausea And Vomiting    PMH: Past Medical History:  Diagnosis Date   Anemia    Aortic stenosis    Arthritis    CHF (congestive heart failure) (HCC)    Coronary artery disease    Diabetes mellitus without complication (HCC)    type 2   GERD (gastroesophageal reflux disease)    Heart murmur    History of blood transfusion    Hypercholesteremia    Hypertension    Hypothyroidism    Mitral regurgitation    Myocardial infarction (HCC)    Peripheral vascular disease    PONV (postoperative nausea and vomiting)    Thyroid  disease     PSH: Past Surgical History:  Procedure Laterality Date   ABDOMINAL AORTOGRAM W/LOWER EXTREMITY Bilateral 01/14/2020   Procedure: ABDOMINAL AORTOGRAM W/LOWER EXTREMITY;  Surgeon: Gretta Lonni PARAS, MD;  Location: [facility];  Service: Cardiovascular;  Laterality: Bilateral;   ABDOMINAL AORTOGRAM W/LOWER EXTREMITY N/A 07/05/2022   Procedure: ABDOMINAL AORTOGRAM W/LOWER EXTREMITY;  Surgeon: Gretta Lonni PARAS, MD;  Location: [facility];  Service: Cardiovascular;  Laterality: N/A;   ABDOMINAL AORTOGRAM W/LOWER EXTREMITY N/A 08/02/2022   Procedure: ABDOMINAL AORTOGRAM W/LOWER  EXTREMITY;  Surgeon: Gretta Lonni PARAS, MD;  Location: [facility] INVASIVE CV LAB;  Service: Cardiovascular;  Laterality: N/A;   ABDOMINAL AORTOGRAM W/LOWER EXTREMITY N/A 08/06/2022   Procedure: ABDOMINAL AORTOGRAM W/LOWER EXTREMITY;  Surgeon: Gretta Lonni PARAS, MD;  Location: [facility];  Service: Cardiovascular;  Laterality: N/A;   AMPUTATION Bilateral 07/20/2022   Procedure: AMPUTATION RIGHT GREAT TOE AND SECOND TOE, AMPUTATION LEFT  GREAT TOE;  Surgeon: Harden Jerona GAILS, MD;  Location: [facility] OR;  Service: Orthopedics;  Laterality: Bilateral;   AMPUTATION Bilateral 08/08/2022   Procedure: BILATERAL TRANSMETATARSAL AMPUTATION;  Surgeon: Harden Jerona GAILS, MD;  Location: [facility] OR;  Service: Orthopedics;  Laterality: Bilateral;   AMPUTATION Left 10/22/2023   Procedure: AMPUTATION BELOW KNEE;  Surgeon: Lanis Fonda BRAVO, MD;  Location: [facility] OR;  Service: Vascular;  Laterality: Left;   AMPUTATION TOE     BIOPSY  04/06/2023   Procedure: BIOPSY;  Surgeon: Mirza Elspeth SQUIBB, MD;  Location: [facility] ENDOSCOPY;  Service: Gastroenterology;;   COLONOSCOPY WITH PROPOFOL  N/A 04/06/2023   Procedure: COLONOSCOPY WITH PROPOFOL ;  Surgeon: Bridget Elspeth SQUIBB, MD;  Location: [facility] ENDOSCOPY;  Service: Gastroenterology;  Laterality: N/A;   CYSTOSCOPY WITH STENT PLACEMENT N/A 11/30/2023   Procedure: CYSTOSCOPY, WITH FULGURATION OF BLEEDING AND CLOT EVACUATION;  Surgeon: Roseann Adine PARAS., MD;  Location: [facility] OR;  Service: Urology;  Laterality: N/A;  CLOT EVACUATION OF BLADDER   ESOPHAGOGASTRODUODENOSCOPY (EGD) WITH PROPOFOL  N/A 04/06/2023   Procedure: ESOPHAGOGASTRODUODENOSCOPY (EGD) WITH PROPOFOL ;  Surgeon: Randa Elspeth SQUIBB, MD;  Location: [facility] ENDOSCOPY;  Service: Gastroenterology;  Laterality: N/A;   GIVENS CAPSULE STUDY N/A 04/06/2023   Procedure: GIVENS CAPSULE STUDY;  Surgeon: Clarine Elspeth SQUIBB, MD;  Location: [facility] ENDOSCOPY;  Service: Gastroenterology;  Laterality: N/A;   PERIPHERAL VASCULAR ATHERECTOMY Right 01/14/2020   Procedure: PERIPHERAL VASCULAR ATHERECTOMY;  Surgeon: Gretta Lonni PARAS, MD;  Location: [facility] INVASIVE CV LAB;  Service: Cardiovascular;  Laterality: Right;  sfa   PERIPHERAL VASCULAR INTERVENTION Right 01/14/2020   Procedure: PERIPHERAL VASCULAR INTERVENTION;  Surgeon: Gretta Lonni PARAS, MD;  [facility]: [facility];  Service: Cardiovascular;  Laterality: Right;  sfa stent    PERIPHERAL VASCULAR INTERVENTION  07/05/2022   Procedure:  PERIPHERAL VASCULAR INTERVENTION;  Surgeon: Gretta Lonni PARAS, MD;  [facility];  Service: Cardiovascular;;   PERIPHERAL VASCULAR INTERVENTION  08/02/2022   Procedure: PERIPHERAL VASCULAR INTERVENTION;  Surgeon: Gretta Lonni PARAS, MD;  [facility];  Service: Cardiovascular;;   PERIPHERAL VASCULAR INTERVENTION  08/06/2022   Procedure: PERIPHERAL VASCULAR INTERVENTION;  Surgeon: Gretta Lonni PARAS, MD;  Location: [facility] INVASIVE CV LAB;  Service: Cardiovascular;;   PERIPHERAL VASCULAR THROMBECTOMY  08/02/2022   Procedure: PERIPHERAL VASCULAR THROMBECTOMY;  Surgeon: Gretta Lonni PARAS, MD;  [facility];  Service: Cardiovascular;;   REVISION AMPUTATION, BELOW THE KNEE Left 10/23/2023   Procedure: REVISION AMPUTATION, BELOW THE KNEE;  Surgeon: Harden Jerona GAILS, MD;  Location: [facility] OR;  Service: Orthopedics;  Laterality: Left;  REVISION LEFT BELOW KNEE AMPUTATION   RIGHT HEART CATH N/A 11/29/2023   Procedure: RIGHT HEART CATH;  Surgeon: Rolan Ezra RAMAN, MD;  Location: [facility] INVASIVE CV LAB;  Service: Cardiovascular;  Laterality: N/A;   RIGHT HEART CATH AND CORONARY ANGIOGRAPHY N/A 01/17/2024   Procedure: RIGHT HEART CATH AND CORONARY ANGIOGRAPHY;  Surgeon: Wendel Lurena POUR, MD;  Location: [facility];  Service: Cardiovascular;  Laterality: N/A;   RIGHT/LEFT HEART CATH AND CORONARY ANGIOGRAPHY N/A 08/23/2022   Procedure:  RIGHT/LEFT HEART CATH AND CORONARY ANGIOGRAPHY;  Surgeon: Dann Candyce RAMAN, MD;  Location: [facility] INVASIVE CV LAB;  Service: Cardiovascular;  Laterality: N/A;   SKIN SPLIT GRAFT Right 03/18/2020   Procedure: SKIN GRAFTING RIGHT FOOT ULCER;  Surgeon: Harden Jerona GAILS, MD;  Location: [facility] OR;  Service: Orthopedics;  Laterality: Right;   STUMP REVISION Right 11/30/2022   Procedure: REVISION RIGHT TRANSMETATARSAL AMPUTATION;  Surgeon: Harden Jerona GAILS, MD;  Location: [facility] OR;  Service: Orthopedics;  Laterality: Right;   TEE WITHOUT CARDIOVERSION N/A  08/27/2022   Procedure: TRANSESOPHAGEAL ECHOCARDIOGRAM;  Surgeon: Hobart Powell BRAVO, MD;  Location: [facility] INVASIVE CV LAB;  Service: Cardiovascular;  Laterality: N/A;   TRANSESOPHAGEAL ECHOCARDIOGRAM (CATH LAB) N/A 10/24/2023   Procedure: TRANSESOPHAGEAL ECHOCARDIOGRAM;  Surgeon: Michele Richardson, DO;  Location: [facility];  Service: Cardiovascular;  Laterality: N/A;   WISDOM TOOTH EXTRACTION      SH: Social History   Tobacco Use   Smoking status: Former    Current packs/day: 0.00    Types: Cigarettes    Quit date: 01/2017    Years since quitting: 7.0   Smokeless tobacco: Never  Vaping Use   Vaping status: Never Used  Substance Use Topics   Alcohol use: Not Currently   Drug use: Yes    Types: Marijuana    Comment: on ocassion- Last time - 11/13/22    ROS: Constitutional:  Negative for fever, chills, weight loss CV: Negative for chest pain, previous MI, hypertension Respiratory:  Negative for shortness of breath, wheezing, sleep apnea, frequent cough GI:  Negative for nausea, vomiting, bloody stool, GERD  PE: BP 102/70   Pulse 94   Ht 5' 4 (1.626 m)   Wt 189 lb (85.7 kg)   BMI 32.44 kg/m  GENERAL APPEARANCE:  Well appearing, well developed, well nourished, NAD HEENT:  Atraumatic, normocephalic, oropharynx clear NECK:  Supple without lymphadenopathy or thyromegaly ABDOMEN:  Soft, non-tender, no masses EXTREMITIES: Status post left BKA  NEUROLOGIC:  Alert and oriented x 3, in wheelchair, CN II-XII grossly intact MENTAL STATUS:  appropriate BACK:  Non-tender to palpation, No CVAT SKIN:  Warm, dry, and intact   Results: U/A:

## 2024-02-21 NOTE — Progress Notes (Signed)
 [facility] PROGRESS NOTE PCP: [name], NP Primary Cardiologist: Diannah SHAUNNA Maywood, MD HF Cardiologist: Dr. Cherrie  HPI: 58 y.o. 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    Admitted 4/24 with NSTEMI. Cath showed 3V CAD and significant MR. CABG and MVR recommended, but surgery deferred d/t multiple medical issues.   Admitted 6/25 for MRSA bacteremia and necrotizing fasciitis. Required L BKA followed by revision. TEE 6/25 with EF 60-65%, mild to moderate MR and moderate to severe AS, but no evidence of endocarditis. Four week course of IV abx with daptomycin  recommended. Discharged 10/29/23 to CIR for rehab.   Transferred back to [facility] 7/25 with a/c HF, and concern for low-output with acute respiratory failure. She was diuresed with IV lasix , but SCr continued to rise. Arranged for RHC but case aborted d/t significant dyspnea w/ hypoxia. She required intubation for flash pulmonary edema, and started on inotropic support DBA. Renal function continued to decline and she transiently required CRRT. Eventually underwent RHC showing severely elevated PCWP, mildly elevated RAP, preserved CO. No ischemic eval due to AKI. Course complicated by hematuria, requiring PRBCs. She went to OR for cystoscopy and declotting of bladder. She was able to be extubated, drips weaned and GDMT titrated, however limited by soft BP and creatinine. She was discharged home, weight 184 lbs.  She was referred to structural heart clinic previous visit. Evaluated by Dr. Wendel who ultimately referred her for repeat [facility] for TAVR w/u. R/LHC 01/17/24 demonstrated multivessel disease relatively unchanged versus prior study with occluded left circumflex, high-grade mid LAD, and moderate right coronary artery disease. RHC RA 8, PAP 53/29 (40), PCWP 29 w/ v waves to 44 mmHg, CO 6.7 L/min and Fick cardiac index of 3.6 L/min/m, PVR 1.6  WU, PAPi 3.   Admitted 9/25 with A/C HFrEF. Diuresed well with high doses of IV lasix . Received 1uPRBC for IDA with known AMVs. Asymptomatic at discharge despite elevated ReDs (chronically elevated). Discharged with 3 doses of Furoscix  and higher PO diuretic dose.   Here today for post hospital CHF follow-up. Accompanied by her mother who assists with providing history. No longer using O2 continuously, just at night. Has not been able to weigh at home. Does not have LLE prosthesis yet, waiting for stump wound to heal. Purchasing a wheelchair scale so that she can weigh. Denies dyspnea, orthopnea, PND and lower extremity edema. Taking all medications as prescribed. Used furoscix  post discharge as directed. BP tends to average 100s systolic prior to am midodrine .   Cardiac Studies - Echo (9/25): EF 25%, b/l pleural effusions, at least moderate MR, aortic valve not fully interrogated - RHC (9/25): severely elevated PCWP 29 mmHg with V waves to 44 mmHg , RA 8, CI 3.6, PAPi 3 - RHC (8/25): RA 8, PA 60/25 (40), PCWP mean 29 with prominent v waves to 43, CO/CI (Fick) 5.28/2.77, PVR 2.1 WU - Ltd echo (7/25): EF 25-30%, G2DD, mild to moderate MR, moderate to severe AS - Echo (7/25): EF 25%, RV normal, mild to moderate MR, mild AS - TEE (6/25): LVEF 60-65%, RV normal, no thrombus, moderate to severe AS (mean gradient 27 mmHg, AVA per VTI 1.07 cm) - R/LHC (4/24): severe 3v CAD, severe MR; RA 9, PA 42/24 (32), PCWP 24 with v waves to 45, CO/CI (Fick) 8.58/4.26  Past Medical History:  Diagnosis Date   Anemia    Aortic stenosis  Arthritis    CHF (congestive heart failure) (HCC)    Coronary artery disease    Diabetes mellitus without complication (HCC)    type 2   GERD (gastroesophageal reflux disease)    Heart murmur    History of blood transfusion    Hypercholesteremia    Hypertension    Hypothyroidism    Mitral regurgitation    Myocardial infarction [facility])    Peripheral vascular disease     PONV (postoperative nausea and vomiting)    Thyroid  disease    Current Outpatient Medications  Medication Sig Dispense Refill   aspirin  EC 81 MG tablet Take 1 tablet (81 mg total) by mouth daily. (Patient taking differently: Take 81 mg by mouth at bedtime.) 30 tablet 3   atorvastatin  (LIPITOR) 40 MG tablet Take 40 mg by mouth daily.     clopidogrel  (PLAVIX ) 75 MG tablet Take 1 tablet (75 mg total) by mouth daily. 90 tablet 3   diphenhydrAMINE  HCl, Sleep, (ZZZQUIL) 25 MG CAPS Take 25 mg by mouth at bedtime.     ferrous sulfate  325 (65 FE) MG EC tablet Take 1 tablet (325 mg total) by mouth in the morning and at bedtime. (Patient taking differently: Take 325-650 mg by mouth in the morning and at bedtime. Take one (325mg ) or two tablets (650) once per day in the morning alternating dose strength.) 60 tablet 0   gabapentin  (NEURONTIN ) 300 MG capsule Take 300 mg by mouth at bedtime.     insulin  degludec (TRESIBA  FLEXTOUCH) 100 UNIT/ML FlexTouch Pen Inject 6 Units into the skin at bedtime. (Patient taking differently: Inject 16 Units into the skin at bedtime.)     levothyroxine  (SYNTHROID ) 150 MCG tablet Take 150 mcg by mouth daily before breakfast.     mexiletine (MEXITIL) 150 MG capsule Take 1 capsule (150 mg total) by mouth 2 (two) times daily. 60 capsule 3   midodrine  (PROAMATINE ) 2.5 MG tablet Take 1 tablet (2.5 mg total) by mouth 2 (two) times daily with a meal. 60 tablet 3   nitroGLYCERIN  (NITROSTAT ) 0.4 MG SL tablet Place 1 tablet (0.4 mg total) under the tongue every 5 (five) minutes x 3 doses as needed for chest pain (if no relief after 3rd dose proceed to ED or call 911). 11/06/2022-New 25 tablet 3   OXYGEN  Inhale 2 L into the lungs continuous.     pantoprazole  (PROTONIX ) 40 MG tablet Take 40 mg by mouth daily.     polyethylene glycol (MIRALAX  / GLYCOLAX ) 17 g packet Take 17 g by mouth 2 (two) times daily. (Patient not taking: Reported on 02/21/2024)     potassium chloride  SA (KLOR-CON  M) 20  MEQ tablet Take 3 tablets (60 mEq total) by mouth daily. 90 tablet 1   promethazine  (PHENERGAN ) 25 MG tablet Take 1 tablet (25 mg total) by mouth every 6 (six) hours as needed for nausea or vomiting. 20 tablet 0   torsemide  (DEMADEX ) 100 MG tablet Take 1 tablet (100 mg total) by mouth 2 (two) times daily. 60 tablet 1   No current facility-administered medications for this visit.   Allergies  Allergen Reactions   Trental  [Pentoxifylline ] Nausea And Vomiting   Vibramycin [Doxycycline] Nausea And Vomiting   Social History   Socioeconomic History   Marital status: Divorced    Spouse name: Not on file   Number of children: 0   Years of education: Not on file   Highest education level: Not on file  Occupational History   Not on  file  Tobacco Use   Smoking status: Former    Current packs/day: 0.00    Types: Cigarettes    Quit date: 01/2017    Years since quitting: 7.0   Smokeless tobacco: Never  Vaping Use   Vaping status: Never Used  Substance and Sexual Activity   Alcohol use: Not Currently   Drug use: Yes    Types: Marijuana    Comment: on ocassion- Last time - 11/13/22   Sexual activity: Yes    Birth control/protection: Post-menopausal  Other Topics Concern   Not on file  Social History Narrative   Not on file   Social Drivers of Health   Financial Resource Strain: Low Risk  (10/19/2023)   Received from [facility]   Overall Financial Resource Strain (CARDIA)    How hard is it for you to pay for the very basics like food, housing, medical care, and heating?: Not hard at all  Food Insecurity: No Food Insecurity (01/21/2024)   Hunger Vital Sign    Worried About Running Out of Food in the Last Year: Never true    Ran Out of Food in the Last Year: Never true  Transportation Needs: No Transportation Needs (01/21/2024)   PRAPARE - Administrator, Civil Service (Medical): No    Lack of Transportation (Non-Medical): No  Physical Activity: Not on file  Stress:  Not on file  Social Connections: Unknown (12/15/2023)   Social Connection and Isolation Panel    Frequency of Communication with Friends and Family: Three times a week    Frequency of Social Gatherings with Friends and Family: Three times a week    Attends Religious Services: Not on file    Active Member of Clubs or Organizations: Not on file    Attends Club or Organization Meetings: Not on file    Marital Status: Not on file  Intimate Partner Violence: Not At Risk (01/21/2024)   Humiliation, Afraid, Rape, and Kick questionnaire    Fear of Current or Ex-Partner: No    Emotionally Abused: No    Physically Abused: No    Sexually Abused: No   Family History  Problem Relation Age of Onset   Diabetes Other    CAD Other     Wt Readings from Last 3 Encounters:  02/21/24 85.7 kg (189 lb)  02/04/24 86 kg (189 lb 8 oz)  01/24/24 86 kg (189 lb 9.5 oz)   There were no vitals taken for this visit.  PHYSICAL EXAM: General:  NAD. Arrived in wheelchair. Neck: JVP 6-7 cm Cor: Regular rate & rhythm. 3/6 AS murmur with reduced S2. Lungs: diminished Extremities: R TMA, L BKA (left leg in protective boot) Neuro: alert & oriented x 3. Affect pleasant.  ECG SR 73 bpm, anterior Qs  ReDs reading: 42 %, abnormal   ASSESSMENT & PLAN: Chronic Systolic Heart Failure - TEE 6/25: EF 60-65%, RV okay, mild to mod MR, moderate-severe AS with mG 27 mmHg and AVA 1.07 cm2 - Admitted for mixed shock (CGS and septic) - Echo 11/15/23: EF 25% - RHC (7/25) aborted 2/2 flash pulmonary edema  - Repeat echo with EF 25-30%, G2DD, mild to mod MR, mod-severe AS  - RHC (8/25): severely elevated PCWP, mildly elevated RAP, preserved CO.  - RHC (9/25): severely elevated PCWP 29 mmHg with V waves to 44 mmHg , RA 8, CI 3.6, PAPi 3 - Currently NYHA III, confounded by physical deconditioning.   - Volume okay on exam. ReDS 42% (  elevated but much lower than prior readings). Continue Torsemide  100 mg BID. Will give 2 samples  of furoscix  kits to have at home as needed. Can also consider PRN metolazone  in the future. - HF GDMT limited by renal fx and hypotension requiring midodrine  support. BP improving. Decrease midodrine  to 2.5 mg TID. Eventually would like to add GDMT but CKD will also limit this. - Not on SGLT2i with hx UTI, does not want to retrial  2. CAD - NSTEMI in 4/24. Multivessel CAD. CABG had been recommended but managed medically due to other active medical issues - Repeat LHC 9/25 for TAVR w/u. CAD stable from prior study. Planning continued medical management given stable disease and lack of anginal symptoms. - No chest pain - Continue DAPT with ASA + plavix  - Continue Atorvastatin   3. Valvular heart disease - Mild to moderate MR and moderate to severe AS on echo 6/25 - Repeat echo 7/25 with extremely poor windows, based on exam expect AS moderate-severe - Echo 11/20/23 moderate to severe AS,. Vmax 3.21 w/ DVI 0.26, mG 22 mmHg - Structural Heart Team following for potential TAVR. Will reach out to structural team coordinator regarding next steps.   4. MRSA bacteremia - Necrotizing fasciitis - S/p recent left BKA 6/25; S/p R TMA in 4/24   5. PAD - hx SFA stents - On aspirin /plavix  + statin.    6. Chronic Anemia - Hx GI bleed from AVMs. ? Heyde syndrome - Required transfusions 7/25 and 9/25 - denies any recent gross bleeding  - CBC today   7. CKD IIIb - recent AKIs in setting of volume overload/low-output>>Started on CVVH 11/16/23, stopped 11/19/23, followed by recurrent AKI 2/2 bladder outlet obstruction from extensive clots in bladder.  S/p cystoscopy and clot removal - most recent SCr baseline low 2s - no SGLT2i as above - Follows with [facility]  8. PVCs - Frequent on previous EKGs. Has been asymptomatic  - 2 week zio 9/25: SR avg rate 82 bpm, SVT runs (longest 1 min 19 seconds), 13% PVC burden - check sleep study - Start mexiletine 150 mg BID  Follow up 2-3 weeks with APP, high risk for  readmission/recurrent decompensation   Harlene CHRISTELLA Gainer, PA-C 02/21/24

## 2024-02-24 ENCOUNTER — Ambulatory Visit: Payer: Self-pay | Admitting: Internal Medicine

## 2024-02-24 ENCOUNTER — Telehealth (HOSPITAL_COMMUNITY): Payer: Self-pay

## 2024-02-24 NOTE — Telephone Encounter (Signed)
 Called to confirm/remind patient of their appointment at [facility] on 02/25/24.   Appointment:   [x] Confirmed  [] Left mess   [] No answer/No voice mail  [] VM Full/unable to leave message  [] Phone not in service  Patient reminded to bring all medications and/or complete list.  Confirmed patient has transportation. Gave directions, instructed to utilize valet parking.

## 2024-02-24 NOTE — Progress Notes (Deleted)
 [address]       [place] [address]             [phone]        [facility] Medical Record #[id number] Date of Birth: Nov 09, 1965  Referring: [name], NP Primary Care: [name], NP Primary Cardiologist:Vishnu SHAUNNA Maywood, MD  Chief Complaint:   No chief complaint on file.   History of Present Illness:     Kayla Schmitt is a 58 y.o. female presents for surgical evaluation of ***  58 y.o. 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    Admitted 4/24 with NSTEMI. Cath showed 3V CAD and significant MR. CABG and MVR recommended, but surgery deferred d/t multiple medical issues.   Admitted 6/25 for MRSA bacteremia and necrotizing fasciitis. Required L BKA followed by revision. TEE 6/25 with EF 60-65%, mild to moderate MR and moderate to severe AS, but no evidence of endocarditis. Four week course of IV abx with daptomycin  recommended. Discharged 10/29/23 to CIR for rehab.    Transferred back to [facility] 7/25 with a/c HF, and concern for low-output with acute respiratory failure. She was diuresed with IV lasix , but SCr continued to rise. Arranged for RHC but case aborted d/t significant dyspnea w/ hypoxia and inability to lie flat. She was diuresed and ultimately required intubation for flash pulmonary edema, and started on DBA gtt. Renal function continued to decline and she transiently required CRRT. Eventually underwent RHC showing severely elevated PCWP, mildly elevated RAP, preserved CO. No ischemic eval due to AKI. Course complicated by hematuria, requiring PRBCs. She went to OR for cystoscopy and declotting of bladder. She was able to be extubated, drips weaned and GDMT titrated, however limited by soft BP and creatinine. She was discharged home, weight 184 lbs.   She was referred to structural heart clinic previous visit. Evaluated by  Dr. Wendel who ultimately referred her for repeat [facility] for TAVR w/u. Study done 01/17/24 demonstrated multivessel disease relatively unchanged versus prior study with occluded left circumflex, high-grade mid LAD, and moderate right coronary artery disease. RHC RA 8, PAP 53/29 (40), PCWP 29 w/ v waves to 44 mmHg, CO 6.7 L/min and Fick cardiac index of 3.6 L/min/m, PVR 1.6 WU, PAPi 3.    Yesterday she was seen in the HF clinic. Marked volume overload. ReDs 57%. Sent to ED and admitted with A/C HFrEF. BNP elevated. CXR - congestive heart failure. HS Trop 519>547, Creatinine 2.5, CO2 19, Hgb 8.2. Started on 160 mg IV lasix  twice a day.       Past Medical History:  Diagnosis Date   Anemia    Aortic stenosis    Arthritis    CHF (congestive heart failure) (HCC)    Coronary artery disease    Diabetes mellitus without complication (HCC)    type 2   GERD (gastroesophageal reflux disease)    Heart murmur    History of blood transfusion    Hypercholesteremia    Hypertension    Hypothyroidism    Mitral regurgitation    Myocardial infarction [facility])    Peripheral vascular disease    PONV (postoperative nausea and vomiting)    Thyroid  disease     Past Surgical History:  Procedure Laterality Date   ABDOMINAL AORTOGRAM W/LOWER EXTREMITY Bilateral 01/14/2020   Procedure: ABDOMINAL AORTOGRAM W/LOWER EXTREMITY;  Surgeon: Gretta,  Lonni PARAS, MD;  [facility]: [facility] IN[facility];  Service: Cardiovascular;  Laterality: Bilateral;   ABDOMINAL AORTOGRAM W/LOWER EXTREMITY N/A 07/05/2022   Procedure: ABDOMINAL AORTOGRAM W/LOWER EXTREMITY;  Surgeon: Gretta Lonni PARAS, MD;  Location: [facility];  Service: Cardiovascular;  Laterality: N/A;   ABDOMINAL AORTOGRAM W/LOWER EXTREMITY N/A 08/02/2022   Procedure: ABDOMINAL AORTOGRAM W/LOWER EXTREMITY;  Surgeon: Gretta Lonni PARAS, MD;  [facility]: [facility];  Service: Cardiovascular;  Laterality: N/A;   ABDOMINAL AORTOGRAM W/LOWER EXTREMITY N/A  08/06/2022   Procedure: ABDOMINAL AORTOGRAM W/LOWER EXTREMITY;  Surgeon: Gretta Lonni PARAS, MD;  [facility]: [facility];  Service: Cardiovascular;  Laterality: N/A;   AMPUTATION Bilateral 07/20/2022   Procedure: AMPUTATION RIGHT GREAT TOE AND SECOND TOE, AMPUTATION LEFT GREAT TOE;  Surgeon: Harden Jerona GAILS, MD;  Location: [facility] OR;  Service: Orthopedics;  Laterality: Bilateral;   AMPUTATION Bilateral 08/08/2022   Procedure: BILATERAL TRANSMETATARSAL AMPUTATION;  Surgeon: Harden Jerona GAILS, MD;  Location: [facility] OR;  Service: Orthopedics;  Laterality: Bilateral;   AMPUTATION Left 10/22/2023   Procedure: AMPUTATION BELOW KNEE;  Surgeon: Lanis Fonda BRAVO, MD;  Location: [facility] OR;  Service: Vascular;  Laterality: Left;   AMPUTATION TOE     BIOPSY  04/06/2023   Procedure: BIOPSY;  Surgeon: Xara Elspeth SQUIBB, MD;  Location: [facility] ENDOSCOPY;  Service: Gastroenterology;;   COLONOSCOPY WITH PROPOFOL  N/A 04/06/2023   Procedure: COLONOSCOPY WITH PROPOFOL ;  Surgeon: Emmajean Elspeth SQUIBB, MD;  Location: [facility] ENDOSCOPY;  Service: Gastroenterology;  Laterality: N/A;   CYSTOSCOPY WITH STENT PLACEMENT N/A 11/30/2023   Procedure: CYSTOSCOPY, WITH FULGURATION OF BLEEDING AND CLOT EVACUATION;  Surgeon: Roseann Adine PARAS., MD;  Location: [facility] OR;  Service: Urology;  Laterality: N/A;  CLOT EVACUATION OF BLADDER   ESOPHAGOGASTRODUODENOSCOPY (EGD) WITH PROPOFOL  N/A 04/06/2023   Procedure: ESOPHAGOGASTRODUODENOSCOPY (EGD) WITH PROPOFOL ;  Surgeon: Elesia Elspeth SQUIBB, MD;  Location: [facility] ENDOSCOPY;  Service: Gastroenterology;  Laterality: N/A;   GIVENS CAPSULE STUDY N/A 04/06/2023   Procedure: GIVENS CAPSULE STUDY;  Surgeon: Deari Elspeth SQUIBB, MD;  Location: [facility] ENDOSCOPY;  Service: Gastroenterology;  Laterality: N/A;   PERIPHERAL VASCULAR ATHERECTOMY Right 01/14/2020   Procedure: PERIPHERAL VASCULAR ATHERECTOMY;  Surgeon: Gretta Lonni PARAS, MD;  Location: [facility] INVASIVE CV LAB;  Service: Cardiovascular;  Laterality: Right;  sfa    PERIPHERAL VASCULAR INTERVENTION Right 01/14/2020   Procedure: PERIPHERAL VASCULAR INTERVENTION;  Surgeon: Gretta Lonni PARAS, MD;  [facility];  Service: Cardiovascular;  Laterality: Right;  sfa stent    PERIPHERAL VASCULAR INTERVENTION  07/05/2022   Procedure: PERIPHERAL VASCULAR INTERVENTION;  Surgeon: Gretta Lonni PARAS, MD;  [facility];  Service: Cardiovascular;;   PERIPHERAL VASCULAR INTERVENTION  08/02/2022   Procedure: PERIPHERAL VASCULAR INTERVENTION;  Surgeon: Gretta Lonni PARAS, MD;  [facility];  Service: Cardiovascular;;   PERIPHERAL VASCULAR INTERVENTION  08/06/2022   Procedure: PERIPHERAL VASCULAR INTERVENTION;  Surgeon: Gretta Lonni PARAS, MD;  Location: [facility] INVASIVE CV LAB;  Service: Cardiovascular;;   PERIPHERAL VASCULAR THROMBECTOMY  08/02/2022   Procedure: PERIPHERAL VASCULAR THROMBECTOMY;  Surgeon: Gretta Lonni PARAS, MD;  [facility];  Service: Cardiovascular;;   REVISION AMPUTATION, BELOW THE KNEE Left 10/23/2023   Procedure: REVISION AMPUTATION, BELOW THE KNEE;  Surgeon: Harden Jerona GAILS, MD;  Location: [facility] OR;  Service: Orthopedics;  Laterality: Left;  REVISION LEFT BELOW KNEE AMPUTATION   RIGHT HEART CATH N/A 11/29/2023   Procedure: RIGHT HEART CATH;  Surgeon: Rolan Ezra RAMAN, MD;  [facility];  Service: Cardiovascular;  Laterality: N/A;   RIGHT HEART CATH AND CORONARY ANGIOGRAPHY N/A 01/17/2024   Procedure: RIGHT HEART CATH AND CORONARY ANGIOGRAPHY;  Surgeon: Wendel Lurena POUR, MD;  [facility];  Service: Cardiovascular;  Laterality: N/A;   RIGHT/LEFT HEART CATH AND CORONARY ANGIOGRAPHY N/A 08/23/2022   Procedure: RIGHT/LEFT HEART CATH AND CORONARY ANGIOGRAPHY;  Surgeon: Dann Candyce RAMAN, MD;  Location: [facility] INVASIVE CV LAB;  Service: Cardiovascular;  Laterality: N/A;   SKIN SPLIT GRAFT Right 03/18/2020   Procedure: SKIN GRAFTING RIGHT FOOT ULCER;  Surgeon: Harden Jerona GAILS,  MD;  Location: [facility] OR;  Service: Orthopedics;  Laterality: Right;   STUMP REVISION Right 11/30/2022   Procedure: REVISION RIGHT TRANSMETATARSAL AMPUTATION;  Surgeon: Harden Jerona GAILS, MD;  Location: [facility] OR;  Service: Orthopedics;  Laterality: Right;   TEE WITHOUT CARDIOVERSION N/A 08/27/2022   Procedure: TRANSESOPHAGEAL ECHOCARDIOGRAM;  Surgeon: Hobart Powell BRAVO, MD;  Location: [facility] INVASIVE CV LAB;  Service: Cardiovascular;  Laterality: N/A;   TRANSESOPHAGEAL ECHOCARDIOGRAM (CATH LAB) N/A 10/24/2023   Procedure: TRANSESOPHAGEAL ECHOCARDIOGRAM;  Surgeon: Michele Richardson, DO;  [facility];  Service: Cardiovascular;  Laterality: N/A;   WISDOM TOOTH EXTRACTION      Social History:  Social History   Tobacco Use  Smoking Status Former   Current packs/day: 0.00   Types: Cigarettes   Quit date: 01/2017   Years since quitting: 7.0  Smokeless Tobacco Never    Social History   Substance and Sexual Activity  Alcohol Use Not Currently     Allergies  Allergen Reactions   Trental  [Pentoxifylline ] Nausea And Vomiting   Vibramycin [Doxycycline] Nausea And Vomiting      Current Outpatient Medications  Medication Sig Dispense Refill   aspirin  EC 81 MG tablet Take 1 tablet (81 mg total) by mouth daily. (Patient taking differently: Take 81 mg by mouth at bedtime.) 30 tablet 3   atorvastatin  (LIPITOR) 40 MG tablet Take 40 mg by mouth daily.     clopidogrel  (PLAVIX ) 75 MG tablet Take 1 tablet (75 mg total) by mouth daily. 90 tablet 3   diphenhydrAMINE  HCl, Sleep, (ZZZQUIL) 25 MG CAPS Take 25 mg by mouth at bedtime.     ferrous sulfate  325 (65 FE) MG EC tablet Take 1 tablet (325 mg total) by mouth in the morning and at bedtime. (Patient taking differently: Take 325-650 mg by mouth in the morning and at bedtime. Take one (325mg ) or two tablets (650) once per day in the morning alternating dose strength.) 60 tablet 0   gabapentin  (NEURONTIN ) 300 MG capsule Take 300 mg by mouth at bedtime.      insulin  degludec (TRESIBA  FLEXTOUCH) 100 UNIT/ML FlexTouch Pen Inject 6 Units into the skin at bedtime. (Patient taking differently: Inject 16 Units into the skin at bedtime.)     levothyroxine  (SYNTHROID ) 150 MCG tablet Take 150 mcg by mouth daily before breakfast.     mexiletine (MEXITIL) 150 MG capsule Take 1 capsule (150 mg total) by mouth 2 (two) times daily. 60 capsule 3   midodrine  (PROAMATINE ) 2.5 MG tablet Take 1 tablet (2.5 mg total) by mouth 2 (two) times daily with a meal. 60 tablet 3   nitroGLYCERIN  (NITROSTAT ) 0.4 MG SL tablet Place 1 tablet (0.4 mg total) under the tongue every 5 (five) minutes x 3 doses as needed for chest pain (if no relief after 3rd dose proceed to ED or call 911). 11/06/2022-New 25 tablet 3  OXYGEN  Inhale 2 L into the lungs continuous.     pantoprazole  (PROTONIX ) 40 MG tablet Take 40 mg by mouth daily.     polyethylene glycol (MIRALAX  / GLYCOLAX ) 17 g packet Take 17 g by mouth 2 (two) times daily. (Patient not taking: Reported on 02/21/2024)     potassium chloride  SA (KLOR-CON  M) 20 MEQ tablet Take 3 tablets (60 mEq total) by mouth daily. 90 tablet 1   promethazine  (PHENERGAN ) 25 MG tablet Take 1 tablet (25 mg total) by mouth every 6 (six) hours as needed for nausea or vomiting. 20 tablet 0   torsemide  (DEMADEX ) 100 MG tablet Take 1 tablet (100 mg total) by mouth 2 (two) times daily. 60 tablet 1   No current facility-administered medications for this visit.    (Not in a hospital admission)   Family History  Problem Relation Age of Onset   Diabetes Other    CAD Other      Review of Systems:   ROS    Physical Exam: There were no vitals taken for this visit. Physical Exam    Cardiac Studies & Procedures   ______________________________________________________________________________________________ CARDIAC CATHETERIZATION  CARDIAC CATHETERIZATION 01/17/2024  Conclusion   Mid LM to Dist LM lesion is 40% stenosed.   Ost LAD to Prox LAD  lesion is 75% stenosed.   Mid LAD lesion is 70% stenosed.   Ost Cx to Prox Cx lesion is 100% stenosed.   Mid RCA lesion is 70% stenosed.   1st Diag lesion is 25% stenosed.  1.  Multivessel disease relatively unchanged versus prior study with occluded left circumflex, high-grade mid LAD, and moderate right coronary artery disease.  Given the lack of exertional angina, medical therapy will be pursued. 2.  Fick cardiac output of 6.7 L/min and Fick cardiac index of 3.6 L/min/m with the following hemodynamics: Right atrial pressure mean of 8 mmHg RV 58/6 with an end-diastolic pressure of 12 mmHg PA 53/29 with a mean of 40 mmHg Wedge pressure mean of 29 mmHg with V waves to 44 mmHg PVR of 1.6 Woods units PA pulsatility index of 3 3.  Left femoral access, which is the side of the patient's BKA, looks to be approachable if TAVR is to be pursued.  Recommendation: Judicious IV fluids given CKD, continue evaluation for aortic valve intervention.  Discharge later today.  Findings Coronary Findings Diagnostic  Dominance: Right  Left Main Mid LM to Dist LM lesion is 40% stenosed.  Left Anterior Descending Ost LAD to Prox LAD lesion is 75% stenosed. The lesion is calcified. Mid LAD lesion is 70% stenosed. The lesion is calcified.  First Diagonal Branch 1st Diag lesion is 25% stenosed.  Left Circumflex Ost Cx to Prox Cx lesion is 100% stenosed.  Third Obtuse Marginal Branch Collaterals 3rd Mrg filled by collaterals from 1st Diag.  Right Coronary Artery There is mild diffuse disease throughout the vessel. Mid RCA lesion is 70% stenosed. The lesion is calcified.  Intervention  No interventions have been documented.   CARDIAC CATHETERIZATION  CARDIAC CATHETERIZATION 11/29/2023  Conclusion 1. Severely elevated PCWP with prominent v-waves. 2. Mildly elevated right heart filling pressures. 3. Moderate pulmonary arterial hypertension 4.  Preserved cardiac output.      ECHOCARDIOGRAM  ECHOCARDIOGRAM LIMITED 01/21/2024  Narrative ECHOCARDIOGRAM LIMITED REPORT    Patient Name:   Kayla Schmitt [facility] Date of Exam: 01/21/2024 Medical Rec #:  [id number]              Height:  64.0 in Accession #:    [id number]             Weight:       191.6 lb Date of Birth:  08/15/1965               BSA:          1.921 m Patient Age:    58 years               BP:           102/69 mmHg Patient Gender: F                      HR:           95 bpm. Exam Location:  Inpatient  Procedure: Limited Echo and Intracardiac Opacification Agent (Both Spectral and Color Flow Doppler were utilized during procedure).  Indications:    Elevated Troponin  History:        Patient has prior history of Echocardiogram examinations, most recent 11/20/2023. CHF, Previous Myocardial Infarction and CAD, Signs/Symptoms:Chest Pain; Risk Factors:Diabetes, Hypertension and Former Smoker.  Sonographer:    Juliene Rucks Referring Phys: [id number] CAROLE N HALL  IMPRESSIONS   1. Left ventricular ejection fraction, by estimation, is 25%. The left ventricle has severely decreased function. The left ventricle demonstrates global hypokinesis. Left ventricular diastolic function could not be evaluated- limited study. 2. Large pleural effusion in both left and right lateral regions. There is secondary compressive atelectassis seen in some views. 3. The mitral valve is abnormally calcified. 2D MVA 2.86 cm2. Full MR assessment not performed; at least moderate mitral regurgitation. The mitral valve is abnormal. Moderate mitral valve regurgitation. 4. Aortic valve is visually calcified; Full AS or AI assesssment not performed. Mild, central aortic regurgitation based on PLAX assessment. Aortic valve regurgitation is mild.  Comparison(s): Prior images reviewed side by side. Mitral regurgitation has increased. Pleural effusions are new.  FINDINGS Left Ventricle: Left ventricular ejection fraction, by  estimation, is 25%. The left ventricle has severely decreased function. The left ventricle demonstrates global hypokinesis. The left ventricular internal cavity size was normal in size. There is no left ventricular hypertrophy. Left ventricular diastolic function could not be evaluated. Left ventricular diastolic function could not be evaluated due to mitral regurgitation (moderate or greater).  Mitral Valve: The mitral valve is abnormally calcified. 2D MVA 2.86 cm2. Full MR assessment not performed; at least moderate mitral regurgitation. The mitral valve is abnormal. Moderate mitral valve regurgitation.  Tricuspid Valve: Tricuspid valve regurgitation is mild.  Aortic Valve: Aortic valve is visually calcified; Full AS or AI assesssment not performed. Mild, central aortic regurgitation based on PLAX assessment. Aortic valve regurgitation is mild.  Pulmonic Valve: Pulmonic valve regurgitation is mild.  Aorta: The aortic root and ascending aorta are structurally normal, with no evidence of dilitation.  Additional Comments: There is a large pleural effusion in both left and right lateral regions.  LEFT VENTRICLE PLAX 2D LVIDd:         5.42 cm LVIDs:         4.55 cm LV PW:         1.16 cm LV IVS:        1.06 cm LVOT diam:     1.78 cm LVOT Area:     2.49 cm   LEFT ATRIUM         Index LA diam:    3.81 cm 1.98 cm/m  AORTA Ao Root diam:  2.63 cm Ao Asc diam:  2.89 cm   SHUNTS Systemic Diam: 1.78 cm  Stanly Leavens MD Electronically signed by Stanly Leavens MD Signature Date/Time: 01/21/2024/10:28:08 AM    Final   TEE  ECHO TEE 10/24/2023  Narrative TRANSESOPHOGEAL ECHO REPORT    Patient Name:   Kayla Schmitt [facility] Date of Exam: 10/24/2023 Medical Rec #:  [id number]              Height:       64.0 in Accession #:    [id number]             Weight:       221.8 lb Date of Birth:  1965-12-19               BSA:          2.044 m Patient Age:    57 years                BP:           156/65 mmHg Patient Gender: F                      HR:           84 bpm. Exam Location:  Inpatient  Procedure: Transesophageal Echo, Cardiac Doppler, Color Doppler and Saline Contrast Bubble Study (Both Spectral and Color Flow Doppler were utilized during procedure).  Indications:     Endocarditis  History:         Patient has prior history of Echocardiogram examinations, most recent 10/21/2023. CAD; Risk Factors:Hypertension and Dyslipidemia.  Sonographer:     Philomena Daring Referring Phys:  [id number] TAYLOR A PARCELLS Diagnosing Phys: Madonna Large  PROCEDURE: After discussion of the risks and benefits of a TEE, an informed consent was obtained from the patient. The transesophogeal probe was passed without difficulty through the esophogus of the patient. Sedation performed by different physician. The patient was monitored while under deep sedation. Anesthestetic sedation was provided intravenously by Anesthesiology: 482mg  of Propofol , 60mg  of Lidocaine . The patient developed no complications during the procedure.  IMPRESSIONS   1. Left ventricular ejection fraction, by estimation, is 60 to 65%. The left ventricle has normal function. The left ventricle has no regional wall motion abnormalities. 2. Right ventricular systolic function is normal. The right ventricular size is normal. 3. No left atrial/left atrial appendage thrombus was detected. The LAA emptying velocity was 66 cm/s. 4. The mitral valve is degenerative. Mild to moderate mitral valve regurgitation. No evidence of mitral stenosis. 5. Native valve, calcified, reduced leaflet excursion, mild to moderate aortic regurgitation, moderate to severe aortic stenosis (peak velocity 3.4 m/s, mean gradient 27 mmHg, aortic valve area per VTI 1.07 cm, dimensional index 0.26). 6. There is mild (Grade II) layered plaque involving the descending aorta. 7. Agitated saline contrast bubble study was negative, with no evidence of  any interatrial shunt.  Conclusion(s)/Recommendation(s): No evidence of vegetation/infective endocarditis on this transesophageael echocardiogram.  FINDINGS Left Ventricle: Left ventricular ejection fraction, by estimation, is 60 to 65%. The left ventricle has normal function. The left ventricle has no regional wall motion abnormalities. The left ventricular internal cavity size was normal in size.  Right Ventricle: The right ventricular size is normal. Right vetricular wall thickness was not well visualized. Right ventricular systolic function is normal.  Left Atrium: Left atrial size was normal in size. No left atrial/left atrial appendage thrombus was detected. The LAA emptying velocity was 66 cm/s.  Right  Atrium: Right atrial size was normal in size. Prominent Eustachian valve.  Pericardium: There is no evidence of pericardial effusion.  Mitral Valve: The mitral valve is degenerative in appearance. There is mild thickening of the mitral valve leaflet(s). Normal mobility of the mitral valve leaflets. Mild mitral annular calcification. Mild to moderate mitral valve regurgitation. No evidence of mitral valve stenosis. There is no evidence of mitral valve vegetation.  Tricuspid Valve: The tricuspid valve is grossly normal. Tricuspid valve regurgitation is trivial. No evidence of tricuspid stenosis. There is no evidence of tricuspid valve vegetation.  Aortic Valve: Native valve, calcified, reduced leaflet excursion, mild to moderate aortic regurgitation, moderate to severe aortic stenosis (peak velocity 3.4 m/s, mean gradient 27 mmHg, aortic valve area per VTI 1.07 cm, dimensional index 0.26). Aortic valve mean gradient measures 27.2 mmHg. Aortic valve peak gradient measures 46.3 mmHg. Aortic valve area, by VTI measures 1.07 cm. There is no evidence of aortic valve vegetation.  Pulmonic Valve: The pulmonic valve was grossly normal. Pulmonic valve regurgitation is trivial. No evidence of  pulmonic stenosis. There is no evidence of pulmonic valve vegetation.  Aorta: The aortic root and ascending aorta are structurally normal, with no evidence of dilitation. There is mild (Grade II) layered plaque involving the descending aorta.  Venous: The left lower pulmonary vein, left upper pulmonary vein, right upper pulmonary vein and right lower pulmonary vein are normal.  IAS/Shunts: The interatrial septum appears to be lipomatous. The atrial septum is grossly normal. Agitated saline contrast was given intravenously to evaluate for intracardiac shunting. Agitated saline contrast bubble study was negative, with no evidence of any interatrial shunt.   LEFT VENTRICLE PLAX 2D LVOT diam:     2.30 cm LV SV:         74 LV SV Index:   36 LVOT Area:     4.15 cm   AORTIC VALVE                     PULMONIC VALVE AV Area (Vmax):    1.10 cm      RVOT Peak grad: 4 mmHg AV Area (Vmean):   1.16 cm AV Area (VTI):     1.07 cm AV Vmax:           340.25 cm/s AV Vmean:          243.250 cm/s AV VTI:            0.689 m AV Peak Grad:      46.3 mmHg AV Mean Grad:      27.2 mmHg LVOT Vmax:         89.90 cm/s LVOT Vmean:        68.100 cm/s LVOT VTI:          0.178 m LVOT/AV VTI ratio: 0.26  AORTA Ao Root diam: 3.10 cm Ao Asc diam:  3.50 cm   SHUNTS Systemic VTI:  0.18 m Systemic Diam: 2.30 cm Pulmonic VTI:  0.161 m  Sunit Tolia Electronically signed by Madonna Large Signature Date/Time: 10/24/2023/1:56:25 PM    Final  MONITORS  LONG TERM MONITOR (3-14 DAYS) 01/24/2024  Narrative Patch Wear Time:  13 days and 14 hours (2025-09-05T15:17:15-0400 to 2025-09-19T05:27:56-0400)  Patient had a min HR of 65 bpm, max HR of 167 bpm, and avg HR of 82 bpm. Predominant underlying rhythm was Sinus Rhythm. 44 Supraventricular Tachycardia runs occurred, the run with the fastest interval lasting 4 beats with a max rate of 167 bpm, the longest  lasting 1 min 19 secs with an avg rate of 112 bpm.  Isolated SVEs were rare (<1.0%), SVE Couplets were rare (<1.0%), and SVE Triplets were rare (<1.0%). Isolated VEs were frequent (13.6%, [id number]), VE Couplets were rare (<1.0%, 2450), and VE Triplets were rare (<1.0%, 10). Ventricular Bigeminy and Trigeminy were present.   CT SCANS  CT CORONARY MORPH W/CTA COR W/SCORE 02/20/2024  Addendum 02/23/2024  7:35 PM ADDENDUM REPORT: 02/23/2024 19:33  EXAM: OVER-READ INTERPRETATION  CT CHEST  The following report is an over-read performed by radiologist Dr. Andrea [facility] on 02/23/2024. This over-read does not include interpretation of cardiac or coronary anatomy or pathology. The coronary CTA interpretation by the cardiologist is attached.  COMPARISON:  Concurrent chest CTA reported separately.  FINDINGS: Aortic atherosclerosis. Bilateral pleural effusions, right greater than left. Multifocal ground-glass opacities in both lungs with septal thickening.  IMPRESSION: 1. Please reference concurrently performed full field of view chest CTA for complete thoracic assessment. 2. Multifocal ground-glass opacities in both lungs with septal thickening, likely pulmonary edema, although infection could have a similar appearance. 3. Bilateral pleural effusions, right greater than left.  Aortic Atherosclerosis (ICD10-I70.0).   Electronically Signed By: Andrea Gasman M.D. On: 02/23/2024 19:33  Narrative CLINICAL DATA:  Severe Aortic Stenosis.  EXAM: Cardiac TAVR CT  TECHNIQUE: A non-contrast, gated CT scan was obtained with axial slices of 2.5 mm through the heart for aortic valve scoring. A 120 kV retrospective, gated, contrast cardiac scan was obtained. Gantry rotation speed was 230 msec and collimation was 0.63 mm. Nitroglycerin  was not given. The 3D dataset was reconstructed in systole with motion correction. The 3D data set was reconstructed in 5% intervals of the 0-95% of the R-R cycle. Systolic and  diastolic phases were analyzed on a dedicated workstation using MPR, MIP, and VRT modes. The patient received 100 cc of contrast.  FINDINGS: Image quality: Excellent.  Noise artifact is: Limited.  Valve Morphology: Tricuspid aortic valve with diffuse severe calcifications.  Aortic Valve Calcium  score: 2646  Aortic annular dimension:  Phase assessed: 24%  Annular area: 467 mm2  Annular perimeter: 79.2 mm  Max diameter: 28.4 mm  Min diameter: 20.5 mm  Annular and subannular calcification: None.  Membranous septum length: 10.3 mm  Optimal coplanar projection: LAO 11 CAU 5  Coronary Artery Height above Annulus:  Left Main: 12.6 mm  Right Coronary: 14.9 mm  Sinus of Valsalva Measurements:  Non-coronary: 32 mm  Right-coronary: 30 mm  Left-coronary: 31 mm  Sinus of Valsalva Height:  Non-coronary: 22.4 mm  Right-coronary: 21.8 mm  Left-coronary: 20.1 mm  Sinotubular Junction: 29 mm  Ascending Thoracic Aorta: 33 mm  Coronary Arteries: Normal coronary origin. Right dominance. The study was performed without use of NTG and is insufficient for plaque evaluation. Please refer to recent cardiac catheterization for coronary assessment.  Cardiac Morphology:  Right Atrium: Right atrial size is dilated.  Right Ventricle: The right ventricular cavity is within normal limits.  Left Atrium: Left atrial size is dilated with no left atrial appendage filling defect.  Left Ventricle: The ventricular cavity size is dilated.  Pulmonary arteries: Dilated pulmonary artery suggestive of pulmonary hypertension.  Pulmonary veins: Normal pulmonary venous drainage.  Pericardium: Normal thickness with no significant effusion or calcium  present.  Mitral Valve: The mitral valve is degenerative with mild to moderate calcium .  Extra-cardiac findings: See attached radiology report for non-cardiac structures.  IMPRESSION: 1. Tricuspid aortic valve with severe aortic  stenosis.  2. Annular  measurements support a 26 mm S3 or 29 mm Evolut Pro.  3. No significant annular or subannular calcifications.  4. Sufficient coronary to annulus distance.  5. Optimal Fluoroscopic Angle for Delivery: LAO 11 CAU 5  6. Dilated pulmonary artery suggestive of pulmonary hypertension.  Darryle T. Barbaraann, MD  Electronically Signed: By: Darryle Barbaraann M.D. On: 02/23/2024 15:09     ______________________________________________________________________________________________      ECG ***    I have independently reviewed the above radiologic studies and discussed with the patient   Recent Lab Findings: Lab Results  Component Value Date   WBC 8.2 02/04/2024   HGB 8.0 (L) 02/04/2024   HCT 25.4 (L) 02/04/2024   PLT 276 02/04/2024   GLUCOSE 162 (H) 02/04/2024   CHOL 92 08/03/2022   TRIG 257 (H) 12/01/2023   HDL 13 (L) 08/03/2022   LDLCALC 54 08/03/2022   ALT 33 12/15/2023   AST 27 12/15/2023   NA 137 02/04/2024   K 3.8 02/04/2024   CL 98 02/04/2024   CREATININE 1.76 (H) 02/04/2024   BUN 81 (H) 02/04/2024   CO2 26 02/04/2024   TSH 1.592 01/03/2024   INR 1.3 (H) 11/12/2023   HGBA1C 5.5 10/28/2023      Assessment / Plan:   58 y.o. female with severe aortic stenosis.  STS score: ***.  NYHA Class ***.  The risks and benefits of *** TAVR were discussed in detail.  We also discussed possibility of an emergent sternotomy to address any procedural complications.  Based on our discussion, we collectively decided that an emergent sternotomy would *** be indicated.  The patient is *** agreeable to proceed.  Based on my review of her LHC, echo, and CTA, I agree with the multidisciplinary plan to proceed with a *** TAVR.      I  spent {CHL ONC TIME VISIT - DTPQU:[id number]} counseling the patient face to face.   Kayla Schmitt 02/24/2024 8:26 AM

## 2024-02-25 ENCOUNTER — Other Ambulatory Visit (HOSPITAL_COMMUNITY): Payer: Self-pay

## 2024-02-25 ENCOUNTER — Encounter (HOSPITAL_COMMUNITY): Payer: Self-pay

## 2024-02-25 ENCOUNTER — Other Ambulatory Visit: Payer: Self-pay

## 2024-02-25 ENCOUNTER — Telehealth (HOSPITAL_COMMUNITY): Payer: Self-pay

## 2024-02-25 ENCOUNTER — Ambulatory Visit (HOSPITAL_COMMUNITY)
Admission: RE | Admit: 2024-02-25 | Discharge: 2024-02-25 | Disposition: A | Discharge status: Home / Self Care | Source: Ambulatory Visit | Attending: Family Medicine | Admitting: Family Medicine

## 2024-02-25 ENCOUNTER — Ambulatory Visit (HOSPITAL_COMMUNITY): Payer: Self-pay | Admitting: Family Medicine

## 2024-02-25 VITALS — BP 118/66 | HR 90 | Wt 187.0 lb

## 2024-02-25 DIAGNOSIS — Z79899 Other long term (current) drug therapy: Secondary | ICD-10-CM | POA: Diagnosis not present

## 2024-02-25 DIAGNOSIS — Z89512 Acquired absence of left leg below knee: Secondary | ICD-10-CM | POA: Diagnosis not present

## 2024-02-25 DIAGNOSIS — E1122 Type 2 diabetes mellitus with diabetic chronic kidney disease: Secondary | ICD-10-CM | POA: Diagnosis not present

## 2024-02-25 DIAGNOSIS — Z8614 Personal history of Methicillin resistant Staphylococcus aureus infection: Secondary | ICD-10-CM | POA: Insufficient documentation

## 2024-02-25 DIAGNOSIS — I739 Peripheral vascular disease, unspecified: Secondary | ICD-10-CM

## 2024-02-25 DIAGNOSIS — Z87440 Personal history of urinary (tract) infections: Secondary | ICD-10-CM | POA: Insufficient documentation

## 2024-02-25 DIAGNOSIS — E7800 Pure hypercholesterolemia, unspecified: Secondary | ICD-10-CM | POA: Diagnosis not present

## 2024-02-25 DIAGNOSIS — I5022 Chronic systolic (congestive) heart failure: Secondary | ICD-10-CM | POA: Insufficient documentation

## 2024-02-25 DIAGNOSIS — Z9981 Dependence on supplemental oxygen: Secondary | ICD-10-CM | POA: Insufficient documentation

## 2024-02-25 DIAGNOSIS — Z7982 Long term (current) use of aspirin: Secondary | ICD-10-CM | POA: Insufficient documentation

## 2024-02-25 DIAGNOSIS — D631 Anemia in chronic kidney disease: Secondary | ICD-10-CM | POA: Insufficient documentation

## 2024-02-25 DIAGNOSIS — I350 Nonrheumatic aortic (valve) stenosis: Secondary | ICD-10-CM | POA: Diagnosis not present

## 2024-02-25 DIAGNOSIS — E1151 Type 2 diabetes mellitus with diabetic peripheral angiopathy without gangrene: Secondary | ICD-10-CM | POA: Insufficient documentation

## 2024-02-25 DIAGNOSIS — I493 Ventricular premature depolarization: Secondary | ICD-10-CM | POA: Insufficient documentation

## 2024-02-25 DIAGNOSIS — Z794 Long term (current) use of insulin: Secondary | ICD-10-CM | POA: Diagnosis not present

## 2024-02-25 DIAGNOSIS — I340 Nonrheumatic mitral (valve) insufficiency: Secondary | ICD-10-CM | POA: Insufficient documentation

## 2024-02-25 DIAGNOSIS — I130 Hypertensive heart and chronic kidney disease with heart failure and stage 1 through stage 4 chronic kidney disease, or unspecified chronic kidney disease: Secondary | ICD-10-CM | POA: Diagnosis not present

## 2024-02-25 DIAGNOSIS — Z89431 Acquired absence of right foot: Secondary | ICD-10-CM | POA: Insufficient documentation

## 2024-02-25 DIAGNOSIS — N1832 Chronic kidney disease, stage 3b: Secondary | ICD-10-CM | POA: Insufficient documentation

## 2024-02-25 DIAGNOSIS — I959 Hypotension, unspecified: Secondary | ICD-10-CM | POA: Diagnosis not present

## 2024-02-25 DIAGNOSIS — I252 Old myocardial infarction: Secondary | ICD-10-CM | POA: Insufficient documentation

## 2024-02-25 DIAGNOSIS — Z87891 Personal history of nicotine dependence: Secondary | ICD-10-CM | POA: Diagnosis not present

## 2024-02-25 DIAGNOSIS — Q2730 Arteriovenous malformation, site unspecified: Secondary | ICD-10-CM | POA: Diagnosis not present

## 2024-02-25 DIAGNOSIS — Z7902 Long term (current) use of antithrombotics/antiplatelets: Secondary | ICD-10-CM | POA: Diagnosis not present

## 2024-02-25 DIAGNOSIS — I38 Endocarditis, valve unspecified: Secondary | ICD-10-CM

## 2024-02-25 DIAGNOSIS — Z8719 Personal history of other diseases of the digestive system: Secondary | ICD-10-CM | POA: Insufficient documentation

## 2024-02-25 DIAGNOSIS — I2510 Atherosclerotic heart disease of native coronary artery without angina pectoris: Secondary | ICD-10-CM | POA: Diagnosis not present

## 2024-02-25 DIAGNOSIS — Z95820 Peripheral vascular angioplasty status with implants and grafts: Secondary | ICD-10-CM | POA: Insufficient documentation

## 2024-02-25 LAB — BASIC METABOLIC PANEL WITH GFR
Anion gap: 14 (ref 5–15)
BUN: 83 mg/dL — ABNORMAL HIGH (ref 6–20)
CO2: 19 mmol/L — ABNORMAL LOW (ref 22–32)
Calcium: 8.6 mg/dL — ABNORMAL LOW (ref 8.9–10.3)
Chloride: 101 mmol/L (ref 98–111)
Creatinine, Ser: 2.66 mg/dL — ABNORMAL HIGH (ref 0.44–1.00)
GFR, Estimated: 20 mL/min — ABNORMAL LOW (ref >60)
Glucose, Bld: 161 mg/dL — ABNORMAL HIGH (ref 70–99)
Potassium: 3.1 mmol/L — ABNORMAL LOW (ref 3.5–5.1)
Sodium: 134 mmol/L — ABNORMAL LOW (ref 135–145)

## 2024-02-25 LAB — BRAIN NATRIURETIC PEPTIDE: B Natriuretic Peptide: 1774.9 pg/mL — ABNORMAL HIGH (ref 0.0–100.0)

## 2024-02-25 MED ORDER — FUROSCIX 80 MG/10ML ~~LOC~~ CTKT
80.0000 mg | CARTRIDGE | Freq: Every day | SUBCUTANEOUS | 1 refills | Status: DC | PRN
  Filled 2024-02-25: qty 2, 22d supply, fill #0

## 2024-02-25 MED ORDER — METOLAZONE 2.5 MG PO TABS: 2.5000 mg | ORAL_TABLET | ORAL | 3 refills | Status: AC

## 2024-02-25 MED ORDER — POTASSIUM CHLORIDE CRYS ER 10 MEQ PO TBCR: 60.0000 meq | EXTENDED_RELEASE_TABLET | Freq: Every day | ORAL | 3 refills | Status: DC

## 2024-02-25 NOTE — Addendum Note (Signed)
 Encounter addended by: Marcelina Lisa HERO, RN on: 02/25/2024 3:44 PM  Actions taken: Pharmacy for encounter modified, Order list changed

## 2024-02-25 NOTE — Progress Notes (Signed)
 Specialty Pharmacy Initial Fill Coordination Note  Kayla Schmitt is a 58 y.o. female contacted today regarding initial fill of specialty medication(s) Furosemide  (Furoscix )   Patient requested Delivery   Delivery date: 02/27/24   Verified address: [address] [place] [address]   Medication will be filled on: 02/26/24   Patient is aware of $0 copayment.   Disenrolling as this is a one time fill

## 2024-02-25 NOTE — Progress Notes (Signed)
 ReDS Vest / Clip - 02/25/24 1400       ReDS Vest / Clip   Station Marker A    Ruler Value 35    ReDS Value Range High volume overload    ReDS Actual Value 47

## 2024-02-25 NOTE — Patient Instructions (Signed)
 TAKE Metolazone  2.5 mg EVERY TUESDAY ONLY. Make sure to take an extra 40 mEq ( 4 Tab) of potassium with it.  Your provider has order Furoscix  for you. This is an on-body infuser that gives you a dose of Furosemide .   It will be shipped to your home from [facility], they will call you before shipping  Ensure you write down the time you start your infusion so that if there is a problem you will know how long the infusion lasted  Use Furoscix  only AS DIRECTED by our office  Labs done today, your results will be available in MyChart, we will contact you for abnormal readings.  Your physician recommends that you schedule a follow-up appointment in: 3 weeks.  If you have any questions or concerns before your next appointment please send us  a message through [place] or call our office at [phone].    TO LEAVE A MESSAGE FOR THE NURSE SELECT OPTION 2, PLEASE LEAVE A MESSAGE INCLUDING: YOUR NAME DATE OF BIRTH CALL BACK NUMBER REASON FOR CALL**this is important as we prioritize the call backs  YOU WILL RECEIVE A CALL BACK THE SAME DAY AS LONG AS YOU CALL BEFORE 4:00 PM  At the [facility], you and your health needs are our priority. As part of our continuing mission to provide you with exceptional heart care, we have created designated Provider Care Teams. These Care Teams include your primary Cardiologist (physician) and Advanced Practice Providers (APPs- Physician Assistants and Nurse Practitioners) who all work together to provide you with the care you need, when you need it.   You may see any of the following providers on your designated Care Team at your next follow up: Dr Toribio Fuel Dr Ezra Shuck Dr. Morene Brownie Greig Mosses, NP Caffie Shed, [place] [facility] [place], [place] Beckey Coe, NP Jordan Lee, NP Ellouise Class, NP Tinnie Redman, PharmD Jaun Bash, PharmD   Please be sure to bring in all your medications bottles to  every appointment.    Thank you for choosing [facility]

## 2024-02-26 ENCOUNTER — Other Ambulatory Visit (HOSPITAL_COMMUNITY): Payer: Self-pay

## 2024-02-26 ENCOUNTER — Other Ambulatory Visit: Payer: Self-pay

## 2024-02-26 NOTE — Progress Notes (Signed)
 Kayla Schmitt spoke with the insurance this morning. They confirmed there is a limit of two kits per 22 days. The claim should process successfully if filled for two kits. The office staff has been updated accordingly.

## 2024-02-26 NOTE — Telephone Encounter (Signed)
 Advanced [facility] Failure Patient Advocate Encounter  Prior authorization for Furoscix  [facility]) cancelled due to prior authorization already on file. Billing returns refill too soon rejection, spoke to insurance and confirmed max 2 kits per 22 day supply for this plan. Informed office and pharmacy.  Rachel DEL, CPhT Rx Patient Advocate Phone: [phone]

## 2024-02-27 ENCOUNTER — Telehealth (HOSPITAL_COMMUNITY): Payer: Self-pay | Admitting: *Deleted

## 2024-02-27 DIAGNOSIS — I959 Hypotension, unspecified: Secondary | ICD-10-CM

## 2024-02-27 DIAGNOSIS — E876 Hypokalemia: Secondary | ICD-10-CM

## 2024-02-27 DIAGNOSIS — I5022 Chronic systolic (congestive) heart failure: Secondary | ICD-10-CM

## 2024-02-27 MED ORDER — POTASSIUM CHLORIDE CRYS ER 20 MEQ PO TBCR: EXTENDED_RELEASE_TABLET | ORAL | 11 refills | Status: AC

## 2024-02-27 NOTE — Telephone Encounter (Signed)
 Called patient per Harlene Gainer, NP with following lab results and instructions:  Increase KCL to 60 q am and 40 q pm. Repeat BMET in 1 week.  Pt verbalized understanding of same; updated Rx sent. She has return visit in HF APP Clinic - will repeat labs at that visit.

## 2024-02-28 ENCOUNTER — Encounter: Admitting: Thoracic Surgery (Cardiothoracic Vascular Surgery)

## 2024-03-02 ENCOUNTER — Encounter: Payer: Self-pay | Admitting: Radiology

## 2024-03-10 ENCOUNTER — Ambulatory Visit: Admitting: Orthopedic Surgery

## 2024-03-11 NOTE — Progress Notes (Signed)
 ADVANCED HF CLINIC NOTE  PCP: [name], NP Primary Cardiologist: Diannah SHAUNNA Maywood, MD HF Cardiologist: Dr. Cherrie  HPI: 58 y.o. 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    Admitted 4/24 with NSTEMI. Cath showed 3V CAD and significant MR. CABG and MVR recommended, but surgery deferred d/t multiple medical issues.   Admitted 6/25 for MRSA bacteremia and necrotizing fasciitis. Required L BKA followed by revision. TEE 6/25 with EF 60-65%, mild to moderate MR and moderate to severe AS, but no evidence of endocarditis. Four week course of IV abx with daptomycin  recommended. Discharged 10/29/23 to CIR for rehab.   Transferred back to [facility] 7/25 with a/c HF, and concern for low-output with acute respiratory failure. She was diuresed with IV lasix , but SCr continued to rise. Arranged for RHC but case aborted d/t significant dyspnea w/ hypoxia. She required intubation for flash pulmonary edema, and started on inotropic support DBA. Renal function continued to decline and she transiently required CRRT. Eventually underwent RHC showing severely elevated PCWP, mildly elevated RAP, preserved CO. No ischemic eval due to AKI. Course complicated by hematuria, requiring PRBCs. She went to OR for cystoscopy and declotting of bladder. She was able to be extubated, drips weaned and GDMT titrated, however limited by soft BP and creatinine. She was discharged home, weight 184 lbs.  She was referred to structural heart clinic previous visit. Evaluated by Dr. Wendel who ultimately referred her for repeat [facility] for TAVR w/u. R/LHC 01/17/24 demonstrated multivessel disease relatively unchanged versus prior study with occluded left circumflex, high-grade mid LAD, and moderate right coronary artery disease. RHC RA 8, PAP 53/29 (40), PCWP 29 w/ v waves to 44 mmHg, CO 6.7 L/min and Fick cardiac index of 3.6 L/min/m, PVR 1.6 WU, PAPi  3.   Admitted 9/25 with A/C HFrEF. Diuresed well with high doses of IV lasix . Received 1uPRBC for IDA with known AMVs. Asymptomatic at discharge despite elevated ReDs (chronically elevated). Discharged with 3 doses of Furoscix  and higher PO diuretic dose.   Today she returns for HF follow up with her mother. Overall feeling fine. Urinating briskly on metolazone  days, forgot to take extra KCL this week. Breathing up and down this week, RLE more swollen. More SOB with transfers, worse with showering. Used Furoscix  on Saturday, with brisk urination. Denies palpitations, abnormal bleeding, CP, dizziness, or PND/Orthopnea. Appetite ok. Weight at home 189 pounds. Taking all medications. Stump wound healing, hoping for prosthetic fitting soon. Meets with Dr. Lucas this week as part of pre-TAVR eval.  Cardiac Studies - Echo (9/25): EF 25%, b/l pleural effusions, at least moderate MR, aortic valve not fully interrogated - RHC (9/25): severely elevated PCWP 29 mmHg with V waves to 44 mmHg , RA 8, CI 3.6, PAPi 3 - RHC (8/25): RA 8, PA 60/25 (40), PCWP mean 29 with prominent v waves to 43, CO/CI (Fick) 5.28/2.77, PVR 2.1 WU - Ltd echo (7/25): EF 25-30%, G2DD, mild to moderate MR, moderate to severe AS - Echo (7/25): EF 25%, RV normal, mild to moderate MR, mild AS - TEE (6/25): LVEF 60-65%, RV normal, no thrombus, moderate to severe AS (mean gradient 27 mmHg, AVA per VTI 1.07 cm) - R/LHC (4/24): severe 3v CAD, severe MR; RA 9, PA 42/24 (32), PCWP 24 with v waves to 45, CO/CI (Fick) 8.58/4.26  Past Medical History:  Diagnosis Date   Anemia  Aortic stenosis    Arthritis    CHF (congestive heart failure) (HCC)    Coronary artery disease    Diabetes mellitus without complication (HCC)    type 2   GERD (gastroesophageal reflux disease)    Heart murmur    History of blood transfusion    Hypercholesteremia    Hypertension    Hypothyroidism    Mitral regurgitation    Myocardial infarction [facility])     Peripheral vascular disease    PONV (postoperative nausea and vomiting)    Thyroid  disease    Current Outpatient Medications  Medication Sig Dispense Refill   aspirin  EC 81 MG tablet Take 1 tablet (81 mg total) by mouth daily. 30 tablet 3   atorvastatin  (LIPITOR) 40 MG tablet Take 40 mg by mouth daily.     clopidogrel  (PLAVIX ) 75 MG tablet Take 1 tablet (75 mg total) by mouth daily. 90 tablet 3   diphenhydrAMINE  HCl, Sleep, (ZZZQUIL) 25 MG CAPS Take 25 mg by mouth at bedtime.     ferrous sulfate  325 (65 FE) MG EC tablet Take 1 tablet (325 mg total) by mouth in the morning and at bedtime. 60 tablet 0   Furosemide  (FUROSCIX ) 80 MG/10ML CTKT Inject 80 mg into the skin daily as needed. ONLY AS DIRECT BY THE [facility] 5 each 1   gabapentin  (NEURONTIN ) 300 MG capsule Take 300 mg by mouth at bedtime.     insulin  degludec (TRESIBA  FLEXTOUCH) 100 UNIT/ML FlexTouch Pen Inject 6 Units into the skin at bedtime.     levothyroxine  (SYNTHROID ) 150 MCG tablet Take 150 mcg by mouth daily before breakfast.     metolazone  (ZAROXOLYN ) 2.5 MG tablet Take 1 tablet (2.5 mg total) by mouth once a week. Every Tuesday. Take an extra 40 mEq of Potassium with Metolazone  (Patient taking differently: Take 2.5 mg by mouth once a week. Every Sunday. Take an extra 40 mEq of Potassium with Metolazone ) 20 tablet 3   mexiletine (MEXITIL) 150 MG capsule Take 1 capsule (150 mg total) by mouth 2 (two) times daily. 60 capsule 3   midodrine  (PROAMATINE ) 2.5 MG tablet Take 1 tablet (2.5 mg total) by mouth 2 (two) times daily with a meal. 60 tablet 3   nitroGLYCERIN  (NITROSTAT ) 0.4 MG SL tablet Place 1 tablet (0.4 mg total) under the tongue every 5 (five) minutes x 3 doses as needed for chest pain (if no relief after 3rd dose proceed to ED or call 911). 11/06/2022-New 25 tablet 3   OXYGEN  Inhale 2 L into the lungs continuous.     pantoprazole  (PROTONIX ) 40 MG tablet Take 40 mg by mouth daily.     polyethylene glycol  (MIRALAX  / GLYCOLAX ) 17 g packet Take 17 g by mouth 2 (two) times daily.     potassium chloride  (KLOR-CON  M) 20 MEQ tablet Take 60 meq in am and 40 meq in pm 150 tablet 11   promethazine  (PHENERGAN ) 25 MG tablet Take 1 tablet (25 mg total) by mouth every 6 (six) hours as needed for nausea or vomiting. 20 tablet 0   torsemide  (DEMADEX ) 100 MG tablet Take 1 tablet (100 mg total) by mouth 2 (two) times daily. 60 tablet 1   No current facility-administered medications for this encounter.   Allergies  Allergen Reactions   Trental  [Pentoxifylline ] Nausea And Vomiting   Vibramycin [Doxycycline] Nausea And Vomiting   Social History   Socioeconomic History   Marital status: Divorced    Spouse name: Not on  file   Number of children: 0   Years of education: Not on file   Highest education level: Not on file  Occupational History   Not on file  Tobacco Use   Smoking status: Former    Current packs/day: 0.00    Types: Cigarettes    Quit date: 01/2017    Years since quitting: 7.1   Smokeless tobacco: Never  Vaping Use   Vaping status: Never Used  Substance and Sexual Activity   Alcohol use: Not Currently   Drug use: Yes    Types: Marijuana    Comment: on ocassion- Last time - 11/13/22   Sexual activity: Yes    Birth control/protection: Post-menopausal  Other Topics Concern   Not on file  Social History Narrative   Not on file   Social Drivers of Health   Financial Resource Strain: Low Risk (10/19/2023)   Received from [facility]   Overall Financial Resource Strain (CARDIA)    How hard is it for you to pay for the very basics like food, housing, medical care, and heating?: Not hard at all  Food Insecurity: No Food Insecurity (01/21/2024)   Hunger Vital Sign    Worried About Running Out of Food in the Last Year: Never true    Ran Out of Food in the Last Year: Never true  Transportation Needs: No Transportation Needs (01/21/2024)   PRAPARE - Scientist, Research (physical Sciences) (Medical): No    Lack of Transportation (Non-Medical): No  Physical Activity: Not on file  Stress: Not on file  Social Connections: Unknown (12/15/2023)   Social Connection and Isolation Panel    Frequency of Communication with Friends and Family: Three times a week    Frequency of Social Gatherings with Friends and Family: Three times a week    Attends Religious Services: Not on file    Active Member of Clubs or Organizations: Not on file    Attends Club or Organization Meetings: Not on file    Marital Status: Not on file  Intimate Partner Violence: Not At Risk (01/21/2024)   Humiliation, Afraid, Rape, and Kick questionnaire    Fear of Current or Ex-Partner: No    Emotionally Abused: No    Physically Abused: No    Sexually Abused: No   Family History  Problem Relation Age of Onset   Diabetes Other    CAD Other    Wt Readings from Last 3 Encounters:  03/17/24 85.7 kg (189 lb)  02/25/24 84.8 kg (187 lb)  02/21/24 85.7 kg (189 lb)   BP 118/62   Pulse 89   Ht 5' 4 (1.626 m)   Wt 85.7 kg (189 lb)   SpO2 93%   BMI 32.44 kg/m   PHYSICAL EXAM: General:  NAD. No resp difficulty, arrived in [facility] HEENT: Normal Neck: Supple. No JVD. Cor: Regular rate & rhythm. No rubs, gallops, 2/6 SEM RUSB Lungs: Clear Abdomen: Soft, obese, nontender, nondistended.  Extremities: No cyanosis, clubbing, rash, 3+ RLE edema; s/p R TMA, L BKA Neuro: Alert & oriented x 3, moves all 4 extremities w/o difficulty. Affect pleasant.  ReDs reading: 55%, abnormal  ASSESSMENT & PLAN: Chronic Systolic Heart Failure - TEE 6/25: EF 60-65%, RV okay, mild to mod MR, moderate-severe AS with mG 27 mmHg and AVA 1.07 cm2 - Admitted for mixed shock (CGS and septic) - Echo 11/15/23: EF 25% - RHC (7/25) aborted 2/2 flash pulmonary edema  - Repeat echo with EF 25-30%, G2DD,  mild to mod MR, mod-severe AS  - RHC (8/25): severely elevated PCWP, mildly elevated RAP, preserved CO.  - RHC (9/25): severely  elevated PCWP 29 mmHg with V waves to 44 mmHg , RA 8, CI 3.6, PAPi 3 - NYHA III, confounded by physical deconditioning. Volume back up, ReDS 55%. Suspect her ReDs will not be normal with her AS, ReDs goal ~ 40-42% - GDMT limited by CKD and need for midodrine  - Use Furoscix  + 60 KCL daily x 3 days (hold torsemide  and metolazone  while using Furoscix ). - After 3 days, resume torsemide  100 mg bid + 40 KCL bid. - Continue metolazone  2.5 mg + extra 40 KCL every Sunday. - Continue midodrine  2.5 mg bid. - Not on SGLT2i with hx UTI, does not want to retrial - Labs today, repeat BMET in 1 week.  2. CAD - NSTEMI in 4/24. Multivessel CAD. CABG had been recommended but managed medically due to other active medical issues - Repeat LHC 9/25 for TAVR w/u. CAD stable from prior study. Planning continued medical management given stable disease and lack of anginal symptoms. - No chest pain. - Continue DAPT with ASA + Plavix . - Continue atorvastatin  40 mg daily.  3. Valvular heart disease - Mild to moderate MR and moderate to severe AS on echo 6/25 - Repeat echo 7/25 with extremely poor windows, based on exam expect AS moderate-severe - Echo 11/20/23 moderate to severe AS,. Vmax 3.21 w/ DVI 0.26, mG 22 mmHg - Structural Heart Team following for potential TAVR.  - Sees Dr. Lucas this week for consult - If deemed a TAVR candidate, would like to see her back in AHF clinic pre-procedure to ensure she is optimized.   4. MRSA bacteremia - Necrotizing fasciitis - S/p recent left BKA 6/25 - S/p R TMA in 4/24   5. PAD - hx SFA stents - On aspirin /plavix  + statin.    6. CKD IIIb - recent AKIs in setting of volume overload/low-output>>Started on CVVH 11/16/23, stopped 11/19/23, followed by recurrent AKI 2/2 bladder outlet obstruction from extensive clots in bladder.  S/p cystoscopy and clot removal - Baseline SCr now ~ 2.5 - no SGLT2i as above - Follows with [facility] - BMET today.  7. PVCs - Frequent on  previous EKGs. Has been asymptomatic  - 2 week zio 9/25: SR avg rate 82 bpm, SVT runs (longest 1 min 19 seconds), 13% PVC burden - Sleep study ordered, will see if we can arrange this - Continue mexiletine 150 mg bid  8. Chronic Anemia - Hx GI bleed from AVMs. ? Heyde syndrome - Required transfusions 7/25 and 9/25 - denies any recent gross bleeding   Follow up next month with Dr. Cherrie, as scheduled.  Harlene HERO [place], FNP-BC 03/17/24

## 2024-03-12 ENCOUNTER — Other Ambulatory Visit: Payer: Self-pay

## 2024-03-12 ENCOUNTER — Ambulatory Visit: Admitting: Physician Assistant

## 2024-03-13 ENCOUNTER — Encounter: Admitting: Thoracic Surgery (Cardiothoracic Vascular Surgery)

## 2024-03-13 ENCOUNTER — Ambulatory Visit (INDEPENDENT_AMBULATORY_CARE_PROVIDER_SITE_OTHER): Admitting: Physician Assistant

## 2024-03-13 DIAGNOSIS — T8781 Dehiscence of amputation stump: Secondary | ICD-10-CM

## 2024-03-13 DIAGNOSIS — S88112A Complete traumatic amputation at level between knee and ankle, left lower leg, initial encounter: Secondary | ICD-10-CM

## 2024-03-13 DIAGNOSIS — Z89512 Acquired absence of left leg below knee: Secondary | ICD-10-CM | POA: Diagnosis not present

## 2024-03-15 ENCOUNTER — Encounter: Payer: Self-pay | Admitting: Physician Assistant

## 2024-03-15 NOTE — Progress Notes (Signed)
 Office Visit Note   Patient: Kayla Schmitt           Date of Birth: July 26, 1965           MRN: [id number] Visit Date: 03/13/2024              Requested by: [name], NP [address] [place],  [place] [address] PCP: [name], NP  Chief Complaint  Patient presents with   Left Leg - Routine Post Op    10/23/2023 left BKA       HPI: Kayla Schmitt is a 58 year old female with left transtibial and right transmetatarsal amputations who presents with persistent wounds on both sites.  She has been managing the BKA site with Vashe packing and dry dressing on the right TMA.  Assessment & Plan: Visit Diagnoses:  1. Dehiscence of amputation stump of right lower extremity (HCC)   2. Below-knee amputation of left lower extremity, initial encounter [facility])     Plan: We will try Iodoform gauze packing in the lateral BKA incision wound to help pack the entire wound area.  They will clean both the right and left wound areas with Vashe.  May wash with soap and water  as needed.  Right lateral foot superficial wound will be painted with Betadine  daily.    Follow-Up Instructions: Return in about 4 weeks (around 04/10/2024).   Ortho Exam  Patient is alert, oriented, no adenopathy, well-dressed, normal affect, normal respiratory effort. Very small crusty skin area lateral TMA.  No open wound in the skin.  No cellulitis or drainage today.  The left BKA lateral has a deep 1 cm inversion of the the skin with granulation tissue at the base.  No cellulitis or drainage.  Stump is warm to touch and appears viable.      Imaging: No results found.   Labs: Lab Results  Component Value Date   HGBA1C 5.5 10/28/2023   HGBA1C 6.4 (H) 08/02/2022   HGBA1C 11.0 (H) 01/11/2020   ESRSEDRATE 136 (H) 11/11/2023   ESRSEDRATE 113 (H) 11/04/2023   ESRSEDRATE 136 (H) 01/14/2020   CRP 21.8 (H) 11/11/2023   CRP 7.4 (H) 11/04/2023   CRP 11.3 (H) 01/14/2020   LABURIC 7.7 (H)  02/04/2022   REPTSTATUS 11/17/2023 FINAL 11/15/2023   GRAMSTAIN  11/15/2023    ABUNDANT WBC PRESENT,BOTH PMN AND MONONUCLEAR NO ORGANISMS SEEN    CULT  11/15/2023    RARE Normal respiratory flora-no Staph aureus or Pseudomonas seen Performed at [facility], [address], [place], [place] [address]      Lab Results  Component Value Date   ALBUMIN  3.1 (L) 12/15/2023   ALBUMIN  3.2 (L) 11/30/2023   ALBUMIN  2.8 (L) 11/27/2023    Lab Results  Component Value Date   MG 2.2 01/22/2024   MG 2.3 01/21/2024   MG 2.1 01/03/2024   Lab Results  Component Value Date   VD25OH 50.60 10/31/2023   VD25OH 42.44 10/30/2023    No results found for: PREALBUMIN    Latest Ref Rng & Units 02/04/2024    2:32 PM 01/24/2024    2:59 AM 01/23/2024   12:02 PM  CBC EXTENDED  WBC 4.0 - 10.5 K/uL 8.2  6.4    RBC 3.87 - 5.11 MIL/uL 2.82  2.65    Hemoglobin 12.0 - 15.0 g/dL 8.0  7.8  8.6   HCT 63.9 - 46.0 % 25.4  23.8  26.2   Platelets 150 -  400 K/uL 276  260       There is no height or weight on file to calculate BMI.  Orders:  No orders of the defined types were placed in this encounter.  No orders of the defined types were placed in this encounter.    Procedures: No procedures performed  Clinical Data: No additional findings.  ROS:  All other systems negative, except as noted in the HPI. Review of Systems  Objective: Vital Signs: There were no vitals taken for this visit.  Specialty Comments:  No specialty comments available.  PMFS History: Patient Active Problem List   Diagnosis Date Noted   Acute on chronic systolic (congestive) heart failure (HCC) 12/15/2023   Chronic anemia 12/15/2023   Gross hematuria 11/30/2023   Clot retention of urine 11/30/2023   Advanced care planning/counseling discussion 11/29/2023   Goals of care, counseling/discussion 11/29/2023   Palliative care by specialist 11/29/2023   Chronic kidney disease, stage 3a (HCC) 11/23/2023    Cardiogenic shock (HCC) 11/16/2023   Acute respiratory failure with hypoxia (HCC) 11/13/2023   Anxiety state 11/12/2023   Acute on chronic systolic CHF (congestive heart failure) (HCC) 11/12/2023   Pressure injury of skin 11/11/2023   Unilateral complete BKA, left, sequela 10/29/2023   S/P BKA (below knee amputation) unilateral, left (HCC) 10/29/2023   Necrotizing fasciitis (HCC) 10/22/2023   Gangrene of left foot (HCC) 10/21/2023   MRSA bacteremia 10/21/2023   Sepsis (HCC) 10/20/2023   AKI (acute kidney injury) 10/20/2023   Diffuse pain in left lower extremity 10/20/2023   History of GI bleed 10/20/2023   Elevated troponin 10/20/2023   Hypokalemia 10/20/2023   History of coronary artery disease 10/20/2023   Prolonged QT interval 10/20/2023   AVM (arteriovenous malformation) of small bowel, acquired 04/07/2023   Antiplatelet or antithrombotic long-term use 04/04/2023   Iron  deficiency anemia 04/04/2023   Acute on chronic diastolic heart failure (HCC) 04/03/2023   Osteomyelitis of foot, right, acute (HCC) 12/12/2022   CAD (coronary artery disease) 11/06/2022   Aortic stenosis 11/06/2022   Mitral regurgitation 08/27/2022   NSTEMI (non-ST elevated myocardial infarction) (HCC) 08/18/2022   Acute clinical systolic heart failure (HCC) 08/18/2022   Acute blood loss anemia 08/18/2022   Anemia 08/18/2022   Critical limb ischemia of both lower extremities (HCC) 06/19/2022   Type 2 diabetes mellitus with hyperlipidemia (HCC) 01/28/2020   Hyperlipidemia 01/28/2020   Hypertension 01/28/2020   Hypothyroidism 01/28/2020   Obesity, class 1 01/28/2020   Ulcerated, foot, right, limited to breakdown of skin (HCC)    Cellulitis of right foot    PVD (peripheral vascular disease)    Cellulitis 01/11/2020   Blister of leg 01/06/2020   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/28/2017   Chest pain 10/28/2017   GERD (gastroesophageal reflux disease) 10/28/2017   Past Medical History:  Diagnosis  Date   Anemia    Aortic stenosis    Arthritis    CHF (congestive heart failure) (HCC)    Coronary artery disease    Diabetes mellitus without complication (HCC)    type 2   GERD (gastroesophageal reflux disease)    Heart murmur    History of blood transfusion    Hypercholesteremia    Hypertension    Hypothyroidism    Mitral regurgitation    Myocardial infarction (HCC)    Peripheral vascular disease    PONV (postoperative nausea and vomiting)    Thyroid  disease     Family History  Problem Relation Age of  Onset   Diabetes Other    CAD Other     Past Surgical History:  Procedure Laterality Date   ABDOMINAL AORTOGRAM W/LOWER EXTREMITY Bilateral 01/14/2020   Procedure: ABDOMINAL AORTOGRAM W/LOWER EXTREMITY;  Surgeon: Gretta Lonni PARAS, MD;  Location: [facility];  Service: Cardiovascular;  Laterality: Bilateral;   ABDOMINAL AORTOGRAM W/LOWER EXTREMITY N/A 07/05/2022   Procedure: ABDOMINAL AORTOGRAM W/LOWER EXTREMITY;  Surgeon: Gretta Lonni PARAS, MD;  Location: [facility];  Service: Cardiovascular;  Laterality: N/A;   ABDOMINAL AORTOGRAM W/LOWER EXTREMITY N/A 08/02/2022   Procedure: ABDOMINAL AORTOGRAM W/LOWER EXTREMITY;  Surgeon: Gretta Lonni PARAS, MD;  Location: [facility];  Service: Cardiovascular;  Laterality: N/A;   ABDOMINAL AORTOGRAM W/LOWER EXTREMITY N/A 08/06/2022   Procedure: ABDOMINAL AORTOGRAM W/LOWER EXTREMITY;  Surgeon: Gretta Lonni PARAS, MD;  Location: [facility];  Service: Cardiovascular;  Laterality: N/A;   AMPUTATION Bilateral 07/20/2022   Procedure: AMPUTATION RIGHT GREAT TOE AND SECOND TOE, AMPUTATION LEFT GREAT TOE;  Surgeon: Harden Jerona GAILS, MD;  Location: [facility] OR;  Service: Orthopedics;  Laterality: Bilateral;   AMPUTATION Bilateral 08/08/2022   Procedure: BILATERAL TRANSMETATARSAL AMPUTATION;  Surgeon: Harden Jerona GAILS, MD;  Location: [facility] OR;  Service: Orthopedics;  Laterality: Bilateral;   AMPUTATION Left 10/22/2023   Procedure:  AMPUTATION BELOW KNEE;  Surgeon: Lanis Fonda BRAVO, MD;  Location: [facility] OR;  Service: Vascular;  Laterality: Left;   AMPUTATION TOE     BIOPSY  04/06/2023   Procedure: BIOPSY;  Surgeon: Savvy Elspeth SQUIBB, MD;  Location: [facility] ENDOSCOPY;  Service: Gastroenterology;;   COLONOSCOPY WITH PROPOFOL  N/A 04/06/2023   Procedure: COLONOSCOPY WITH PROPOFOL ;  Surgeon: Kameren Elspeth SQUIBB, MD;  Location: [facility] ENDOSCOPY;  Service: Gastroenterology;  Laterality: N/A;   CYSTOSCOPY WITH STENT PLACEMENT N/A 11/30/2023   Procedure: CYSTOSCOPY, WITH FULGURATION OF BLEEDING AND CLOT EVACUATION;  Surgeon: Roseann Adine PARAS., MD;  Location: [facility] OR;  Service: Urology;  Laterality: N/A;  CLOT EVACUATION OF BLADDER   ESOPHAGOGASTRODUODENOSCOPY (EGD) WITH PROPOFOL  N/A 04/06/2023   Procedure: ESOPHAGOGASTRODUODENOSCOPY (EGD) WITH PROPOFOL ;  Surgeon: Rose Elspeth SQUIBB, MD;  Location: [facility];  Service: Gastroenterology;  Laterality: N/A;   GIVENS CAPSULE STUDY N/A 04/06/2023   Procedure: GIVENS CAPSULE STUDY;  Surgeon: Cynde Elspeth SQUIBB, MD;  Location: [facility] ENDOSCOPY;  Service: Gastroenterology;  Laterality: N/A;   PERIPHERAL VASCULAR ATHERECTOMY Right 01/14/2020   Procedure: PERIPHERAL VASCULAR ATHERECTOMY;  Surgeon: Gretta Lonni PARAS, MD;  Location: [facility] INVASIVE CV LAB;  Service: Cardiovascular;  Laterality: Right;  sfa   PERIPHERAL VASCULAR INTERVENTION Right 01/14/2020   Procedure: PERIPHERAL VASCULAR INTERVENTION;  Surgeon: Gretta Lonni PARAS, MD;  [facility];  Service: Cardiovascular;  Laterality: Right;  sfa stent    PERIPHERAL VASCULAR INTERVENTION  07/05/2022   Procedure: PERIPHERAL VASCULAR INTERVENTION;  Surgeon: Gretta Lonni PARAS, MD;  [facility];  Service: Cardiovascular;;   PERIPHERAL VASCULAR INTERVENTION  08/02/2022   Procedure: PERIPHERAL VASCULAR INTERVENTION;  Surgeon: Gretta Lonni PARAS, MD;  [facility];  Service: Cardiovascular;;   PERIPHERAL  VASCULAR INTERVENTION  08/06/2022   Procedure: PERIPHERAL VASCULAR INTERVENTION;  Surgeon: Gretta Lonni PARAS, MD;  Location: [facility] INVASIVE CV LAB;  Service: Cardiovascular;;   PERIPHERAL VASCULAR THROMBECTOMY  08/02/2022   Procedure: PERIPHERAL VASCULAR THROMBECTOMY;  Surgeon: Gretta Lonni PARAS, MD;  [facility];  Service: Cardiovascular;;   REVISION AMPUTATION, BELOW THE KNEE Left 10/23/2023   Procedure: REVISION AMPUTATION, BELOW THE KNEE;  Surgeon:  Harden Jerona GAILS, MD;  Location: [facility] OR;  Service: Orthopedics;  Laterality: Left;  REVISION LEFT BELOW KNEE AMPUTATION   RIGHT HEART CATH N/A 11/29/2023   Procedure: RIGHT HEART CATH;  Surgeon: Rolan Ezra RAMAN, MD;  Location: [facility] INVASIVE CV LAB;  Service: Cardiovascular;  Laterality: N/A;   RIGHT HEART CATH AND CORONARY ANGIOGRAPHY N/A 01/17/2024   Procedure: RIGHT HEART CATH AND CORONARY ANGIOGRAPHY;  Surgeon: Wendel Lurena POUR, MD;  [facility];  Service: Cardiovascular;  Laterality: N/A;   RIGHT/LEFT HEART CATH AND CORONARY ANGIOGRAPHY N/A 08/23/2022   Procedure: RIGHT/LEFT HEART CATH AND CORONARY ANGIOGRAPHY;  Surgeon: Dann Candyce RAMAN, MD;  Location: [facility] INVASIVE CV LAB;  Service: Cardiovascular;  Laterality: N/A;   SKIN SPLIT GRAFT Right 03/18/2020   Procedure: SKIN GRAFTING RIGHT FOOT ULCER;  Surgeon: Harden Jerona GAILS, MD;  Location: [facility] OR;  Service: Orthopedics;  Laterality: Right;   STUMP REVISION Right 11/30/2022   Procedure: REVISION RIGHT TRANSMETATARSAL AMPUTATION;  Surgeon: Harden Jerona GAILS, MD;  Location: [facility] OR;  Service: Orthopedics;  Laterality: Right;   TEE WITHOUT CARDIOVERSION N/A 08/27/2022   Procedure: TRANSESOPHAGEAL ECHOCARDIOGRAM;  Surgeon: Hobart Powell BRAVO, MD;  Location: [facility] INVASIVE CV LAB;  Service: Cardiovascular;  Laterality: N/A;   TRANSESOPHAGEAL ECHOCARDIOGRAM (CATH LAB) N/A 10/24/2023   Procedure: TRANSESOPHAGEAL ECHOCARDIOGRAM;  Surgeon: Michele Richardson, DO;  [facility];   Service: Cardiovascular;  Laterality: N/A;   WISDOM TOOTH EXTRACTION     Social History   Occupational History   Not on file  Tobacco Use   Smoking status: Former    Current packs/day: 0.00    Types: Cigarettes    Quit date: 01/2017    Years since quitting: 7.1   Smokeless tobacco: Never  Vaping Use   Vaping status: Never Used  Substance and Sexual Activity   Alcohol use: Not Currently   Drug use: Yes    Types: Marijuana    Comment: on ocassion- Last time - 11/13/22   Sexual activity: Yes    Birth control/protection: Post-menopausal

## 2024-03-16 ENCOUNTER — Telehealth (HOSPITAL_COMMUNITY): Payer: Self-pay

## 2024-03-16 NOTE — Telephone Encounter (Signed)
 Called to confirm/remind patient of their appointment at the [facility] on 03/17/24.   Appointment:   [x] Confirmed  [] Left mess   [] No answer/No voice mail  [] VM Full/unable to leave message  [] Phone not in service  Patient reminded to bring all medications and/or complete list.  Confirmed patient has transportation. Gave directions, instructed to utilize valet parking.

## 2024-03-17 ENCOUNTER — Ambulatory Visit (HOSPITAL_COMMUNITY)
Admission: RE | Admit: 2024-03-17 | Discharge: 2024-03-17 | Disposition: A | Discharge status: Home / Self Care | Source: Ambulatory Visit | Attending: Family Medicine | Admitting: Family Medicine

## 2024-03-17 ENCOUNTER — Encounter (HOSPITAL_COMMUNITY)

## 2024-03-17 ENCOUNTER — Ambulatory Visit: Admitting: Vascular Surgery

## 2024-03-17 ENCOUNTER — Other Ambulatory Visit (HOSPITAL_COMMUNITY): Payer: Self-pay

## 2024-03-17 ENCOUNTER — Other Ambulatory Visit: Payer: Self-pay

## 2024-03-17 ENCOUNTER — Ambulatory Visit (HOSPITAL_COMMUNITY): Payer: Self-pay | Admitting: Family Medicine

## 2024-03-17 ENCOUNTER — Encounter (HOSPITAL_COMMUNITY): Payer: Self-pay

## 2024-03-17 VITALS — BP 118/62 | HR 89 | Ht 64.0 in | Wt 189.0 lb

## 2024-03-17 DIAGNOSIS — Z7982 Long term (current) use of aspirin: Secondary | ICD-10-CM | POA: Diagnosis not present

## 2024-03-17 DIAGNOSIS — Z95820 Peripheral vascular angioplasty status with implants and grafts: Secondary | ICD-10-CM | POA: Diagnosis present

## 2024-03-17 DIAGNOSIS — I5022 Chronic systolic (congestive) heart failure: Secondary | ICD-10-CM

## 2024-03-17 DIAGNOSIS — I340 Nonrheumatic mitral (valve) insufficiency: Secondary | ICD-10-CM | POA: Insufficient documentation

## 2024-03-17 DIAGNOSIS — Z7902 Long term (current) use of antithrombotics/antiplatelets: Secondary | ICD-10-CM | POA: Insufficient documentation

## 2024-03-17 DIAGNOSIS — N1832 Chronic kidney disease, stage 3b: Secondary | ICD-10-CM | POA: Insufficient documentation

## 2024-03-17 DIAGNOSIS — Z8719 Personal history of other diseases of the digestive system: Secondary | ICD-10-CM

## 2024-03-17 DIAGNOSIS — Z8614 Personal history of Methicillin resistant Staphylococcus aureus infection: Secondary | ICD-10-CM | POA: Insufficient documentation

## 2024-03-17 DIAGNOSIS — I739 Peripheral vascular disease, unspecified: Secondary | ICD-10-CM

## 2024-03-17 DIAGNOSIS — D631 Anemia in chronic kidney disease: Secondary | ICD-10-CM | POA: Insufficient documentation

## 2024-03-17 DIAGNOSIS — I493 Ventricular premature depolarization: Secondary | ICD-10-CM | POA: Insufficient documentation

## 2024-03-17 DIAGNOSIS — I350 Nonrheumatic aortic (valve) stenosis: Secondary | ICD-10-CM | POA: Diagnosis not present

## 2024-03-17 DIAGNOSIS — E1122 Type 2 diabetes mellitus with diabetic chronic kidney disease: Secondary | ICD-10-CM | POA: Insufficient documentation

## 2024-03-17 DIAGNOSIS — Z794 Long term (current) use of insulin: Secondary | ICD-10-CM | POA: Insufficient documentation

## 2024-03-17 DIAGNOSIS — E785 Hyperlipidemia, unspecified: Secondary | ICD-10-CM | POA: Diagnosis not present

## 2024-03-17 DIAGNOSIS — Z89512 Acquired absence of left leg below knee: Secondary | ICD-10-CM | POA: Diagnosis not present

## 2024-03-17 DIAGNOSIS — I130 Hypertensive heart and chronic kidney disease with heart failure and stage 1 through stage 4 chronic kidney disease, or unspecified chronic kidney disease: Secondary | ICD-10-CM | POA: Diagnosis not present

## 2024-03-17 DIAGNOSIS — E1151 Type 2 diabetes mellitus with diabetic peripheral angiopathy without gangrene: Secondary | ICD-10-CM | POA: Insufficient documentation

## 2024-03-17 DIAGNOSIS — I252 Old myocardial infarction: Secondary | ICD-10-CM | POA: Insufficient documentation

## 2024-03-17 DIAGNOSIS — I38 Endocarditis, valve unspecified: Secondary | ICD-10-CM | POA: Diagnosis not present

## 2024-03-17 DIAGNOSIS — I2510 Atherosclerotic heart disease of native coronary artery without angina pectoris: Secondary | ICD-10-CM | POA: Diagnosis not present

## 2024-03-17 DIAGNOSIS — I5023 Acute on chronic systolic (congestive) heart failure: Secondary | ICD-10-CM | POA: Diagnosis not present

## 2024-03-17 DIAGNOSIS — R0602 Shortness of breath: Secondary | ICD-10-CM | POA: Diagnosis present

## 2024-03-17 LAB — BASIC METABOLIC PANEL WITH GFR
Anion gap: 16 — ABNORMAL HIGH (ref 5–15)
BUN: 59 mg/dL — ABNORMAL HIGH (ref 6–20)
CO2: 25 mmol/L (ref 22–32)
Calcium: 9.3 mg/dL (ref 8.9–10.3)
Chloride: 97 mmol/L — ABNORMAL LOW (ref 98–111)
Creatinine, Ser: 1.89 mg/dL — ABNORMAL HIGH (ref 0.44–1.00)
GFR, Estimated: 31 mL/min — ABNORMAL LOW (ref >60)
Glucose, Bld: 194 mg/dL — ABNORMAL HIGH (ref 70–99)
Potassium: 3.3 mmol/L — ABNORMAL LOW (ref 3.5–5.1)
Sodium: 138 mmol/L (ref 135–145)

## 2024-03-17 LAB — BRAIN NATRIURETIC PEPTIDE: B Natriuretic Peptide: 1778.1 pg/mL — ABNORMAL HIGH (ref 0.0–100.0)

## 2024-03-17 MED ORDER — FUROSCIX 80 MG/10ML ~~LOC~~ CTKT
80.0000 mg | CARTRIDGE | Freq: Every day | SUBCUTANEOUS | 1 refills | Status: AC | PRN
  Filled 2024-03-17 – 2024-03-18 (×2): qty 2, 22d supply, fill #0

## 2024-03-17 NOTE — Progress Notes (Signed)
 Medication Samples have been provided to the patient.  Drug name: FUROSCIX        Strength: 80MG         Qty: 2  LOT: [id number]  Exp.Date: 08/27/2025  Dosing instructions: AS DIRECTED BY THE HF CLINIC  The patient has been instructed regarding the correct time, dose, and frequency of taking this medication, including desired effects and most common side effects.   Kayla Schmitt B [name] 2:42 PM 03/17/2024

## 2024-03-17 NOTE — Progress Notes (Signed)
 ReDS Vest / Clip - 03/17/24 1400       ReDS Vest / Clip   Station Marker A    Ruler Value 28    ReDS Value Range High volume overload    ReDS Actual Value 55

## 2024-03-17 NOTE — Patient Instructions (Addendum)
 Medication Changes:  Your provider has order Furoscix  for you. This is an on-body infuser that gives you a dose of Furosemide .   It will be shipped to your home from [facility], they will call you before shipping  Ensure you write down the time you start your infusion so that if there is a problem you will know how long the infusion lasted  Use Furoscix  only AS DIRECTED by our office  Dosing Directions: DO NOT TAKE TORSEMIDE  WHILE USING FUROSCIX    Day 1= WEDNESDAY---TAKE FUROSCIX  DOSE WITH (3) TABLETS OF POTASSIUM   Day 2= THURSDAY---TAKE FUROSCIX  WITH (3) TABLETS OF POTASSIUM   Day 3= FRIDAY--- TAKE FUROSCIX  DOSE WITH (3) TABLETS OF POTASSIUM  Day 4= SATURDAY--- RESTART TORSEMIDE  100MG  TWICE DAILY with 40 MEQ of POTASSIUM (2 TABLETS) TWICE DAILY. CONTINUE METOLAZONE  2.5 MG + EXTRA 40 MEQ OF KCL EVERY SUNDAY (YOU WILL TAKE A TOTAL OF 6 POTASSIUM TABLETS ON SUNDAYS, OK TO TAKE 2 TABLETS 3X A DAY ON SUNDAY).   Lab Work:  Labs done today, your results will be available in MyChart, we will contact you for abnormal readings.  AND THEN RETURN FOR LABS AS SCHEDULED IN 1 WEEK   Testing/Procedures:  SPLIT NIGHT SLEEP STUDY HAS BEEN SCHEDULED FOR JANUARY THE 28TH   Follow-Up in: NEXT MONTH AS SCHEDULED WITH DR. CHERRIE     At the [facility], you and your health needs are our priority. We have a designated team specialized in the treatment of Heart Failure. This Care Team includes your primary Heart Failure Specialized Cardiologist (physician), Advanced Practice Providers (APPs- Physician Assistants and Nurse Practitioners), and Pharmacist who all work together to provide you with the care you need, when you need it.   You may see any of the following providers on your designated Care Team at your next follow up:  Dr. Toribio Cherrie Dr. Ezra Shuck Dr. Odis Brownie Greig Mosses, NP Caffie Shed, [place] [facility] [place], [place] Beckey Coe, NP Jordan Lee, NP Tinnie Redman, PharmD   Please be sure to bring in all your medications bottles to every appointment.   Need to Contact Us :  If you have any questions or concerns before your next appointment please send us  a message through [place] or call our office at [phone].    TO LEAVE A MESSAGE FOR THE NURSE SELECT OPTION 2, PLEASE LEAVE A MESSAGE INCLUDING: YOUR NAME DATE OF BIRTH CALL BACK NUMBER REASON FOR CALL**this is important as we prioritize the call backs  YOU WILL RECEIVE A CALL BACK THE SAME DAY AS LONG AS YOU CALL BEFORE 4:00 PM

## 2024-03-18 ENCOUNTER — Ambulatory Visit: Attending: Surgery | Admitting: Surgery

## 2024-03-18 ENCOUNTER — Other Ambulatory Visit (HOSPITAL_COMMUNITY): Payer: Self-pay

## 2024-03-18 ENCOUNTER — Encounter: Payer: Self-pay | Admitting: Surgery

## 2024-03-18 ENCOUNTER — Other Ambulatory Visit: Payer: Self-pay

## 2024-03-18 ENCOUNTER — Encounter (HOSPITAL_COMMUNITY): Payer: Self-pay

## 2024-03-18 VITALS — BP 119/74 | HR 87 | Resp 20 | Ht 64.0 in | Wt 189.0 lb

## 2024-03-18 DIAGNOSIS — I350 Nonrheumatic aortic (valve) stenosis: Secondary | ICD-10-CM

## 2024-03-18 NOTE — Progress Notes (Signed)
 Pre Surgical Assessment: 5 M Walk Test  92M=16.64ft  5 Meter Walk Test- trial 1: *** seconds 5 Meter Walk Test- trial 2: *** seconds 5 Meter Walk Test- trial 3: *** seconds 5 Meter Walk Test Average: *** seconds  Patient was unable to compete test due to BKA (Lt leg)

## 2024-03-18 NOTE — Progress Notes (Signed)
 Specialty Pharmacy Refill Coordination Note  Chandlar Timberly Yott is a 58 y.o. female contacted today regarding refills of specialty medication(s) Furosemide  (Furoscix )   Patient requested Delivery   Delivery date: 03/24/24   Verified address: [address]   [place] [address]   Medication will be filled on: 03/23/24  Disenrolling as this is a one time fill.

## 2024-03-21 NOTE — Progress Notes (Signed)
 Patient ID: Kayla Schmitt, female   DOB: [birth year], 58 y.o.   MRN: [id number]  [facility]   MULTIDISCIPLINARY HEART VALVE CLINIC         CARDIOTHORACIC SURGERY CONSULTATION REPORT  PCP is [name], NP Referring Provider is Lurena Red, MD Primary Cardiologist is Diannah SHAUNNA Maywood, MD  Reason for consultation: Severe aortic stenosis  HPI:  The patient is a 58 year old woman with a history of ischemic cardiomyopathy with an ejection fraction of 25 to 30% by echo in July 2025, coronary artery disease deferred for CABG in 2024, hypertension, hyperlipidemia, hypothyroidism, type 2 diabetes, stage IIIa chronic kidney disease, GI bleeding with known AVMs, severe peripheral vascular disease status post bilateral SFA stents, bilateral TMA, and 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  She is currently wheelchair-bound and awaiting a left lower extremity prosthesis.  She had some problems with wound healing after her amputation but it is essentially completely healed at this time.  She was admitted to [facility] in July 2025 with acute heart failure and required intubation due to flash pulmonary edema.  She improved with diuresis and inotropes.  She required transient CRRT.  She improved and was extubated and treated with GDMT.  She was followed up in heart failure clinic in September and was doing well.  Right/left heart cath on 01/17/2024 showed multivessel coronary disease that was relatively unchanged from her prior study with an occluded left circumflex, high-grade mid LAD, and moderate right coronary stenosis.  She has not been having any anginal pain and medical therapy was continued.  Her PA pressure was 53/29 with a mean of 40.  Wedge pressure was 29 with V waves to 44.  PVR was 1.6 Woods units with a PA PI of 3.  Her last complete echo was on 11/20/2023 and showed  a calcified and thickened aortic valve with restricted leaflet mobility.  The mean gradient was 22 mmHg with a valve area by VTI of 0.82 cm.  Left ventricular ejection fraction was 25 to 30% with global hypokinesis.  Stroke-volume index was low at 20.  She reports continued exertional shortness of breath as well as some orthopnea and leg swelling.  She denies any chest pain or pressure.  She has had no dizziness or syncope.  Past Medical History:  Diagnosis Date   Anemia    Aortic stenosis    Arthritis    CHF (congestive heart failure) (HCC)    Coronary artery disease    Diabetes mellitus without complication (HCC)    type 2   GERD (gastroesophageal reflux disease)    Heart murmur    History of blood transfusion    Hypercholesteremia    Hypertension    Hypothyroidism    Mitral regurgitation    Myocardial infarction [facility])    Peripheral vascular disease    PONV (postoperative nausea and vomiting)    Thyroid  disease     Past Surgical History:  Procedure Laterality Date   ABDOMINAL AORTOGRAM W/LOWER EXTREMITY Bilateral 01/14/2020   Procedure: ABDOMINAL AORTOGRAM W/LOWER EXTREMITY;  Surgeon: Gretta Lonni PARAS, MD;  Location: [facility] INVASIVE CV LAB;  Service: Cardiovascular;  Laterality: Bilateral;   ABDOMINAL AORTOGRAM W/LOWER EXTREMITY N/A 07/05/2022   Procedure: ABDOMINAL AORTOGRAM W/LOWER EXTREMITY;  Surgeon: Gretta Lonni PARAS, MD;  Location: [facility];  Service: Cardiovascular;  Laterality: N/A;   ABDOMINAL AORTOGRAM W/LOWER EXTREMITY N/A 08/02/2022  Procedure: ABDOMINAL AORTOGRAM W/LOWER EXTREMITY;  Surgeon: Gretta Lonni PARAS, MD;  Location: [facility] INVASIVE CV LAB;  Service: Cardiovascular;  Laterality: N/A;   ABDOMINAL AORTOGRAM W/LOWER EXTREMITY N/A 08/06/2022   Procedure: ABDOMINAL AORTOGRAM W/LOWER EXTREMITY;  Surgeon: Gretta Lonni PARAS, MD;  Location: [facility];  Service: Cardiovascular;  Laterality: N/A;   AMPUTATION Bilateral 07/20/2022   Procedure:  AMPUTATION RIGHT GREAT TOE AND SECOND TOE, AMPUTATION LEFT GREAT TOE;  Surgeon: Harden Jerona GAILS, MD;  Location: [facility] OR;  Service: Orthopedics;  Laterality: Bilateral;   AMPUTATION Bilateral 08/08/2022   Procedure: BILATERAL TRANSMETATARSAL AMPUTATION;  Surgeon: Harden Jerona GAILS, MD;  Location: [facility] OR;  Service: Orthopedics;  Laterality: Bilateral;   AMPUTATION Left 10/22/2023   Procedure: AMPUTATION BELOW KNEE;  Surgeon: Lanis Fonda BRAVO, MD;  Location: [facility] OR;  Service: Vascular;  Laterality: Left;   AMPUTATION TOE     BIOPSY  04/06/2023   Procedure: BIOPSY;  Surgeon: Oliver Elspeth SQUIBB, MD;  Location: [facility] ENDOSCOPY;  Service: Gastroenterology;;   COLONOSCOPY WITH PROPOFOL  N/A 04/06/2023   Procedure: COLONOSCOPY WITH PROPOFOL ;  Surgeon: Lynlee Elspeth SQUIBB, MD;  Location: [facility] ENDOSCOPY;  Service: Gastroenterology;  Laterality: N/A;   CYSTOSCOPY WITH STENT PLACEMENT N/A 11/30/2023   Procedure: CYSTOSCOPY, WITH FULGURATION OF BLEEDING AND CLOT EVACUATION;  Surgeon: Roseann Adine PARAS., MD;  Location: [facility] OR;  Service: Urology;  Laterality: N/A;  CLOT EVACUATION OF BLADDER   ESOPHAGOGASTRODUODENOSCOPY (EGD) WITH PROPOFOL  N/A 04/06/2023   Procedure: ESOPHAGOGASTRODUODENOSCOPY (EGD) WITH PROPOFOL ;  Surgeon: Joannie Elspeth SQUIBB, MD;  Location: [facility] ENDOSCOPY;  Service: Gastroenterology;  Laterality: N/A;   GIVENS CAPSULE STUDY N/A 04/06/2023   Procedure: GIVENS CAPSULE STUDY;  Surgeon: Carlyann Elspeth SQUIBB, MD;  Location: [facility] ENDOSCOPY;  Service: Gastroenterology;  Laterality: N/A;   PERIPHERAL VASCULAR ATHERECTOMY Right 01/14/2020   Procedure: PERIPHERAL VASCULAR ATHERECTOMY;  Surgeon: Gretta Lonni PARAS, MD;  Location: [facility] INVASIVE CV LAB;  Service: Cardiovascular;  Laterality: Right;  sfa   PERIPHERAL VASCULAR INTERVENTION Right 01/14/2020   Procedure: PERIPHERAL VASCULAR INTERVENTION;  Surgeon: Gretta Lonni PARAS, MD;  [facility];  Service: Cardiovascular;  Laterality: Right;  sfa stent     PERIPHERAL VASCULAR INTERVENTION  07/05/2022   Procedure: PERIPHERAL VASCULAR INTERVENTION;  Surgeon: Gretta Lonni PARAS, MD;  [facility];  Service: Cardiovascular;;   PERIPHERAL VASCULAR INTERVENTION  08/02/2022   Procedure: PERIPHERAL VASCULAR INTERVENTION;  Surgeon: Gretta Lonni PARAS, MD;  [facility];  Service: Cardiovascular;;   PERIPHERAL VASCULAR INTERVENTION  08/06/2022   Procedure: PERIPHERAL VASCULAR INTERVENTION;  Surgeon: Gretta Lonni PARAS, MD;  Location: [facility] INVASIVE CV LAB;  Service: Cardiovascular;;   PERIPHERAL VASCULAR THROMBECTOMY  08/02/2022   Procedure: PERIPHERAL VASCULAR THROMBECTOMY;  Surgeon: Gretta Lonni PARAS, MD;  [facility];  Service: Cardiovascular;;   REVISION AMPUTATION, BELOW THE KNEE Left 10/23/2023   Procedure: REVISION AMPUTATION, BELOW THE KNEE;  Surgeon: Harden Jerona GAILS, MD;  Location: [facility] OR;  Service: Orthopedics;  Laterality: Left;  REVISION LEFT BELOW KNEE AMPUTATION   RIGHT HEART CATH N/A 11/29/2023   Procedure: RIGHT HEART CATH;  Surgeon: Rolan Ezra RAMAN, MD;  Location: [facility] INVASIVE CV LAB;  Service: Cardiovascular;  Laterality: N/A;   RIGHT HEART CATH AND CORONARY ANGIOGRAPHY N/A 01/17/2024   Procedure: RIGHT HEART CATH AND CORONARY ANGIOGRAPHY;  Surgeon: Wendel Lurena POUR, MD;  [facility]: [facility] LAB;  Service: Cardiovascular;  Laterality: N/A;   RIGHT/LEFT HEART CATH AND CORONARY ANGIOGRAPHY N/A  08/23/2022   Procedure: RIGHT/LEFT HEART CATH AND CORONARY ANGIOGRAPHY;  Surgeon: Dann Candyce RAMAN, MD;  Location: [facility] INVASIVE CV LAB;  Service: Cardiovascular;  Laterality: N/A;   SKIN SPLIT GRAFT Right 03/18/2020   Procedure: SKIN GRAFTING RIGHT FOOT ULCER;  Surgeon: Harden Jerona GAILS, MD;  Location: [facility] OR;  Service: Orthopedics;  Laterality: Right;   STUMP REVISION Right 11/30/2022   Procedure: REVISION RIGHT TRANSMETATARSAL AMPUTATION;  Surgeon: Harden Jerona GAILS, MD;  Location: [facility] OR;  Service: Orthopedics;   Laterality: Right;   TEE WITHOUT CARDIOVERSION N/A 08/27/2022   Procedure: TRANSESOPHAGEAL ECHOCARDIOGRAM;  Surgeon: Hobart Powell BRAVO, MD;  Location: [facility] INVASIVE CV LAB;  Service: Cardiovascular;  Laterality: N/A;   TRANSESOPHAGEAL ECHOCARDIOGRAM (CATH LAB) N/A 10/24/2023   Procedure: TRANSESOPHAGEAL ECHOCARDIOGRAM;  Surgeon: Michele Richardson, DO;  [facility]: [facility];  Service: Cardiovascular;  Laterality: N/A;   WISDOM TOOTH EXTRACTION      Family History  Problem Relation Age of Onset   Diabetes Other    CAD Other     Social History   Socioeconomic History   Marital status: Divorced    Spouse name: Not on file   Number of children: 0   Years of education: Not on file   Highest education level: Not on file  Occupational History   Not on file  Tobacco Use   Smoking status: Former    Current packs/day: 0.00    Types: Cigarettes    Quit date: 01/2017    Years since quitting: 7.1   Smokeless tobacco: Never  Vaping Use   Vaping status: Never Used  Substance and Sexual Activity   Alcohol use: Not Currently   Drug use: Yes    Types: Marijuana    Comment: on ocassion- Last time - 11/13/22   Sexual activity: Yes    Birth control/protection: Post-menopausal  Other Topics Concern   Not on file  Social History Narrative   Not on file   Social Drivers of Health   Financial Resource Strain: Low Risk (10/19/2023)   [facility]   Overall Financial Resource Strain (CARDIA)    How hard is it for you to pay for the very basics like food, housing, medical care, and heating?: Not hard at all  Food Insecurity: No Food Insecurity (01/21/2024)   Hunger Vital Sign    Worried About Running Out of Food in the Last Year: Never true    Ran Out of Food in the Last Year: Never true  Transportation Needs: No Transportation Needs (01/21/2024)   PRAPARE - Administrator, Civil Service (Medical): No    Lack of Transportation (Non-Medical): No  Physical  Activity: Not on file  Stress: Not on file  Social Connections: Unknown (12/15/2023)   Social Connection and Isolation Panel    Frequency of Communication with Friends and Family: Three times a week    Frequency of Social Gatherings with Friends and Family: Three times a week    Attends Religious Services: Not on file    Active Member of Clubs or Organizations: Not on file    Attends Club or Organization Meetings: Not on file    Marital Status: Not on file  Intimate Partner Violence: Not At Risk (01/21/2024)   Humiliation, Afraid, Rape, and Kick questionnaire    Fear of Current or Ex-Partner: No    Emotionally Abused: No    Physically Abused: No    Sexually Abused: No    Prior to Admission medications  Medication Sig Start Date End Date Taking? Authorizing Provider  aspirin  EC 81 MG tablet Take 1 tablet (81 mg total) by mouth daily. 01/03/24  Yes Milford, Harlene HERO, FNP  atorvastatin  (LIPITOR) 40 MG tablet Take 40 mg by mouth daily. 12/14/19  Yes [provider]  clopidogrel  (PLAVIX ) 75 MG tablet Take 1 tablet (75 mg total) by mouth daily. 01/03/24  Yes Milford, Harlene HERO, FNP  diphenhydrAMINE  HCl, Sleep, (ZZZQUIL) 25 MG CAPS Take 25 mg by mouth at bedtime.   Yes [provider]  ferrous sulfate  325 (65 FE) MG EC tablet Take 1 tablet (325 mg total) by mouth in the morning and at bedtime. 12/18/23  Yes Bryn Bernardino NOVAK, MD  Furosemide  (FUROSCIX ) 80 MG/10ML CTKT Inject 80 mg into the skin daily as needed. ONLY AS DIRECT BY THE [facility] 03/17/24  Yes Can[place], Britt, [place]  gabapentin  (NEURONTIN ) 300 MG capsule Take 300 mg by mouth at bedtime.   Yes [provider]  insulin  degludec (TRESIBA  FLEXTOUCH) 100 UNIT/ML FlexTouch Pen Inject 6 Units into the skin at bedtime. 11/27/23  Yes Arrien, Mauricio Daniel, MD  levothyroxine  (SYNTHROID ) 150 MCG tablet Take 150 mcg by mouth daily before breakfast. 12/02/19  Yes [provider]  metolazone   (ZAROXOLYN ) 2.5 MG tablet Take 1 tablet (2.5 mg total) by mouth once a week. Every Tuesday. Take an extra 40 mEq of Potassium with Metolazone  Patient taking differently: Take 2.5 mg by mouth once a week. Every Sunday. Take an extra 40 mEq of Potassium with Metolazone  02/25/24 05/25/24 Yes Milford, Harlene HERO, FNP  mexiletine (MEXITIL) 150 MG capsule Take 1 capsule (150 mg total) by mouth 2 (two) times daily. 02/04/24  Yes Colletta Manuelita Garre, PA-C  midodrine  (PROAMATINE ) 2.5 MG tablet Take 1 tablet (2.5 mg total) by mouth 2 (two) times daily with a meal. 02/04/24  Yes Colletta Manuelita Garre, PA-C  nitroGLYCERIN  (NITROSTAT ) 0.4 MG SL tablet Place 1 tablet (0.4 mg total) under the tongue every 5 (five) minutes x 3 doses as needed for chest pain (if no relief after 3rd dose proceed to ED or call 911). 11/06/2022-New 12/09/23  Yes Miriam Norris, NP  OXYGEN  Inhale 2 L into the lungs continuous.   Yes [provider]  pantoprazole  (PROTONIX ) 40 MG tablet Take 40 mg by mouth daily. 12/21/19  Yes [provider]  polyethylene glycol (MIRALAX  / GLYCOLAX ) 17 g packet Take 17 g by mouth 2 (two) times daily. 10/29/23  Yes Hongalgi, Anand D, MD  potassium chloride  (KLOR-CON  M) 20 MEQ tablet Take 60 meq in am and 40 meq in pm Patient taking differently: 40 mEq 2 (two) times daily. 02/27/24  Yes Milford, Harlene HERO, FNP  promethazine  (PHENERGAN ) 25 MG tablet Take 1 tablet (25 mg total) by mouth every 6 (six) hours as needed for nausea or vomiting. 10/15/23  Yes Francesca Elsie CROME, MD  torsemide  (DEMADEX ) 100 MG tablet Take 1 tablet (100 mg total) by mouth 2 (two) times daily. 01/26/24  Yes Fairy Frames, MD    Current Outpatient Medications  Medication Sig Dispense Refill   aspirin  EC 81 MG tablet Take 1 tablet (81 mg total) by mouth daily. 30 tablet 3   atorvastatin  (LIPITOR) 40 MG tablet Take 40 mg by mouth daily.     clopidogrel  (PLAVIX ) 75 MG tablet Take 1 tablet (75 mg total) by mouth daily. 90  tablet 3   diphenhydrAMINE  HCl, Sleep, (ZZZQUIL) 25 MG CAPS Take 25 mg by mouth  at bedtime.     ferrous sulfate  325 (65 FE) MG EC tablet Take 1 tablet (325 mg total) by mouth in the morning and at bedtime. 60 tablet 0   Furosemide  (FUROSCIX ) 80 MG/10ML CTKT Inject 80 mg into the skin daily as needed. ONLY AS DIRECT BY THE [facility] 2 each 1   gabapentin  (NEURONTIN ) 300 MG capsule Take 300 mg by mouth at bedtime.     insulin  degludec (TRESIBA  FLEXTOUCH) 100 UNIT/ML FlexTouch Pen Inject 6 Units into the skin at bedtime.     levothyroxine  (SYNTHROID ) 150 MCG tablet Take 150 mcg by mouth daily before breakfast.     metolazone  (ZAROXOLYN ) 2.5 MG tablet Take 1 tablet (2.5 mg total) by mouth once a week. Every Tuesday. Take an extra 40 mEq of Potassium with Metolazone  (Patient taking differently: Take 2.5 mg by mouth once a week. Every Sunday. Take an extra 40 mEq of Potassium with Metolazone ) 20 tablet 3   mexiletine (MEXITIL) 150 MG capsule Take 1 capsule (150 mg total) by mouth 2 (two) times daily. 60 capsule 3   midodrine  (PROAMATINE ) 2.5 MG tablet Take 1 tablet (2.5 mg total) by mouth 2 (two) times daily with a meal. 60 tablet 3   nitroGLYCERIN  (NITROSTAT ) 0.4 MG SL tablet Place 1 tablet (0.4 mg total) under the tongue every 5 (five) minutes x 3 doses as needed for chest pain (if no relief after 3rd dose proceed to ED or call 911). 11/06/2022-New 25 tablet 3   OXYGEN  Inhale 2 L into the lungs continuous.     pantoprazole  (PROTONIX ) 40 MG tablet Take 40 mg by mouth daily.     polyethylene glycol (MIRALAX  / GLYCOLAX ) 17 g packet Take 17 g by mouth 2 (two) times daily.     potassium chloride  (KLOR-CON  M) 20 MEQ tablet Take 60 meq in am and 40 meq in pm (Patient taking differently: 40 mEq 2 (two) times daily.) 150 tablet 11   promethazine  (PHENERGAN ) 25 MG tablet Take 1 tablet (25 mg total) by mouth every 6 (six) hours as needed for nausea or vomiting. 20 tablet 0   torsemide  (DEMADEX )  100 MG tablet Take 1 tablet (100 mg total) by mouth 2 (two) times daily. 60 tablet 1   No current facility-administered medications for this visit.    Allergies  Allergen Reactions   Trental  [Pentoxifylline ] Nausea And Vomiting   Vibramycin [Doxycycline] Nausea And Vomiting      Review of Systems:   General:  normal appetite, + decreased energy, no weight gain, no weight loss, no fever  Cardiac:  no chest pain with exertion, no chest pain at rest, +SOB with moderate exertion, no resting SOB, no PND, + orthopnea, no palpitations, no arrhythmia, no atrial fibrillation, + LE edema, no dizzy spells, no syncope  Respiratory:  +  shortness of breath, + home oxygen , no productive cough, no dry cough, no bronchitis, no wheezing, no hemoptysis, no asthma, no pain with inspiration or cough, no sleep apnea, no CPAP at night  GI:   no difficulty swallowing, no reflux, no frequent heartburn, no hiatal hernia, no abdominal pain, no constipation, no diarrhea, no hematochezia, no hematemesis, no melena  GU:   no dysuria,  no frequency, no urinary tract infection, no hematuria, no kidney stones, + chronic kidney disease  Vascular:  no pain suggestive of claudication, no pain in feet, no leg cramps, no varicose veins, no DVT, no non-healing foot ulcer  Neuro:   no stroke,  no TIA's, no seizures, no headaches, no temporary blindness one eye,  no slurred speech, no peripheral neuropathy, no chronic pain, + instability of gait, no memory/cognitive dysfunction  Musculoskeletal: no arthritis, no joint swelling, no myalgias, + difficulty walking, + reduced mobility   Skin:   no rash, no itching, no skin infections, no pressure sores or ulcerations  Psych:   no anxiety, no depression, no nervousness, no unusual recent stress  Eyes:   no blurry vision, no floaters, no recent vision changes, no glasses or contacts  ENT:   no hearing loss, no loose or painful teeth, no dentures, no issues  Hematologic:  + easy  bruising, no abnormal bleeding, no clotting disorder, no frequent epistaxis  Endocrine:  + diabetes, does check CBG's at home     Physical Exam:   BP 119/74   Pulse 87   Resp 20   Ht 5' 4 (1.626 m)   Wt 189 lb (85.7 kg)   SpO2 98% Comment: RA  BMI 32.44 kg/m   General:  well-appearing  HEENT:  Unremarkable, NCAT, PERLA, EOMI  Neck:   no JVD, no bruits, no adenopathy   Chest:   clear to auscultation, symmetrical breath sounds, no wheezes, no rhonchi   CV:   RRR, 3/6 systolic murmur RSB, no diastolic murmur  Abdomen:  soft, non-tender, no masses   Extremities:  warm, well-perfused, pedal pulses not palpable, no lower extremity edema, left BKA stump wrapped.  Rectal/GU  Deferred  Neuro:   Grossly non-focal and symmetrical throughout  Skin:   Clean and dry, no rashes, no breakdown  Diagnostic Tests:  ECHOCARDIOGRAM LIMITED REPORT       Patient Name:   Kayla Schmitt Date of Exam: 11/20/2023  Medical Rec #:  [id number]              Height:       64.0 in  Accession #:    [id number]             Weight:       195.1 lb  Date of Birth:  17-Aug-1965               BSA:          1.936 m  Patient Age:    57 years               BP:           102/66 mmHg  Patient Gender: F                      HR:           72 bpm.  Exam Location:  Inpatient   Procedure: Limited Echo, Limited Color Doppler and Intracardiac  Opacification            Agent (Both Spectral and Color Flow Doppler were utilized  during            procedure).   Indications:    CHF I50.9    History:        Patient has prior history of Echocardiogram examinations,  most                 recent 11/15/2023. CHF, Previous Myocardial Infarction and  CAD,                 Signs/Symptoms:Chest Pain; Risk Factors:Hypertension,  Diabetes  and Dyslipidemia.    Sonographer:    Thea Norlander RCS  Sonographer#2:  Damien Senior RDCS  Referring Phys: [id number] JORDAN LEE   IMPRESSIONS     1. Left ventricular  ejection fraction, by estimation, is 25 to 30%. The  left ventricle has severely decreased function. The left ventricle  demonstrates global hypokinesis. The left ventricular internal cavity size  was mildly dilated. Left ventricular  diastolic parameters are consistent with Grade II diastolic dysfunction  (pseudonormalization). Elevated left atrial pressure.   2. The mitral valve is abnormal. Mild to moderate mitral valve  regurgitation. Moderate mitral annular calcification.   3. There is moderate calcification of the aortic valve. There is moderate  thickening of the aortic valve. Moderate to severe aortic valve stenosis.  Aortic valve Vmax measures 3.21 m/s.   4. The inferior vena cava is dilated in size with <50% respiratory  variability, suggesting right atrial pressure of 15 mmHg.   FINDINGS   Left Ventricle: Left ventricular ejection fraction, by estimation, is 25  to 30%. The left ventricle has severely decreased function. The left  ventricle demonstrates global hypokinesis. Definity  contrast agent was  given IV to delineate the left  ventricular endocardial borders. The left ventricular internal cavity size  was mildly dilated. Left ventricular diastolic parameters are consistent  with Grade II diastolic dysfunction (pseudonormalization). Elevated left  atrial pressure.   Mitral Valve: The mitral valve is abnormal. Moderate mitral annular  calcification. Mild to moderate mitral valve regurgitation.   Tricuspid Valve: The tricuspid valve is normal in structure. Tricuspid  valve regurgitation is trivial.   Aortic Valve: Suspect primarily moderate AS, Vmax 3.98m/s with DVI 0.26.  There is moderate calcification of the aortic valve. There is moderate  thickening of the aortic valve. Moderate to severe aortic stenosis is  present. Aortic valve mean gradient  measures 22.0 mmHg. Aortic valve peak gradient measures 41.2 mmHg. Aortic  valve area, by VTI measures 0.82 cm.    Pulmonic Valve: The pulmonic valve was normal in structure. Pulmonic valve  regurgitation is trivial.   Venous: The inferior vena cava is dilated in size with less than 50%  respiratory variability, suggesting right atrial pressure of 15 mmHg.   LEFT VENTRICLE  PLAX 2D  LVIDd:         5.60 cm   Diastology  LVIDs:         5.00 cm   LV e' medial:    6.64 cm/s  LV PW:         0.90 cm   LV E/e' medial:  21.4  LV IVS:        0.80 cm   LV e' lateral:   8.05 cm/s  LVOT diam:     1.90 cm   LV E/e' lateral: 17.6  LV SV:         39  LV SV Index:   20  LVOT Area:     2.84 cm     RIGHT VENTRICLE         IVC  TAPSE (M-mode): 2.0 cm  IVC diam: 2.70 cm   LEFT ATRIUM         Index  LA diam:    4.10 cm 2.12 cm/m   AORTIC VALVE  AV Area (Vmax):    0.74 cm  AV Area (Vmean):   0.96 cm  AV Area (VTI):     0.82 cm  AV Vmax:           321.00  cm/s  AV Vmean:          185.500 cm/s  AV VTI:            0.480 m  AV Peak Grad:      41.2 mmHg  AV Mean Grad:      22.0 mmHg  LVOT Vmax:         83.60 cm/s  LVOT Vmean:        63.000 cm/s  LVOT VTI:          0.138 m  LVOT/AV VTI ratio: 0.29    AORTA  Ao Root diam: 2.90 cm   MITRAL VALVE  MV Area (PHT): 4.49 cm     SHUNTS  MV Decel Time: 169 msec     Systemic VTI:  0.14 m  MV E velocity: 142.00 cm/s  Systemic Diam: 1.90 cm  MV A velocity: 84.20 cm/s  MV E/A ratio:  1.69   Morene Brownie  Electronically signed by Morene Brownie  Signature Date/Time: 11/20/2023/5:15:20 PM        Final     Procedures  RIGHT HEART CATH AND CORONARY ANGIOGRAPHY   Conclusion      Mid LM to Dist LM lesion is 40% stenosed.   Ost LAD to Prox LAD lesion is 75% stenosed.   Mid LAD lesion is 70% stenosed.   Ost Cx to Prox Cx lesion is 100% stenosed.   Mid RCA lesion is 70% stenosed.   1st Diag lesion is 25% stenosed.   1.  Multivessel disease relatively unchanged versus prior study with occluded left circumflex, high-grade mid LAD, and moderate right  coronary artery disease.  Given the lack of exertional angina, medical therapy will be pursued. 2.  Fick cardiac output of 6.7 L/min and Fick cardiac index of 3.6 L/min/m with the following hemodynamics:            Right atrial pressure mean of 8 mmHg            RV 58/6 with an end-diastolic pressure of 12 mmHg            PA 53/29 with a mean of 40 mmHg            Wedge pressure mean of 29 mmHg with V waves to 44 mmHg            PVR of 1.6 Woods units            PA pulsatility index of 3 3.  Left femoral access, which is the side of the patient's BKA, looks to be approachable if TAVR is to be pursued.   Recommendation: Judicious IV fluids given CKD, continue evaluation for aortic valve intervention.  Discharge later today.   Procedural Details  Technical Details The patient is a 58 year old female with a history of, hyper, peripheral vascular disease status post bilateral SFA stents, bilateral transmetatarsal amputation, left BKA, CKD stage IIIa, recent GI bleed, and severe symptomatic aortic stenosis who was evaluated in the outpatient setting.  She is referred for coronary angiography and right heart catheterization as part of her evaluation for potential aortic valve intervention.  After obtaining consent, the patient brought to the cardiac catheterization laboratory and prepped in draped sterile fashion.  Xylocaine  was used to anesthetize the right wrist and a 6 French Terumo glide sheath was placed.  5000 units heparin  and 5 mg verapamil  were administered through the sheath.  Previously placed antecubital IV could not be exchanged for a wire.  Therefore  ultrasound and micropuncture were used to gain access to the right internal jugular vein.  A 5 French sheath was placed.  Right heart catheterization performed with a 5 French balloontipped catheter.  A 6 French TIG catheter was used for selective coronary angiography.  A 5 French pigtail catheter was maneuvered to the distal abdominal aorta  and aortography bilateral iliofemoral angiography was performed.  After review of the angiographic and hemodynamic data, no further interventions were pursued.  A TR band was placed.  There were no acute complications. Estimated blood loss <50 mL.   During this procedure medications were administered to achieve and maintain moderate conscious sedation while the patient's heart rate, blood pressure, and oxygen  saturation were continuously monitored and I was present face-to-face 100% of this time. Suzen Salinas Haren Cardiovascular Specialist and Shasta Bevely Gosling Cardiovascular Specialist are independent, trained observers who assisted in the monitoring of the patient's level of consciousness.   Medications (Filter: Administrations occurring from 1051 to 1152 on 01/17/24)  important  Continuous medications are totaled by the amount administered until 01/17/24 1152.   Heparin  (Porcine) in NaCl 1000-0.9 UT/500ML-% SOLN (mL)  Total volume: 1,000 mL Date/Time Rate/Dose/Volume Action   01/17/24 1113 500 mL Given   1113 500 mL Given   midazolam  (VERSED ) injection (mg)  Total dose: 1 mg Date/Time Rate/Dose/Volume Action   01/17/24 1113 1 mg Given   fentaNYL  (SUBLIMAZE ) injection (mcg)  Total dose: 25 mcg Date/Time Rate/Dose/Volume Action   01/17/24 1113 25 mcg Given   lidocaine  (PF) (XYLOCAINE ) 1 % injection (mL)  Total volume: 9 mL Date/Time Rate/Dose/Volume Action   01/17/24 1117 2 mL Given   1120 2 mL Given   1123 5 mL Given   Radial Cocktail/Verapamil  only (mL)  Total volume: 10 mL Date/Time Rate/Dose/Volume Action   01/17/24 1121 10 mL Given   heparin  sodium (porcine) injection (Units)  Total dose: 5,000 Units Date/Time Rate/Dose/Volume Action   01/17/24 1121 5,000 Units Given   iohexol  (OMNIPAQUE ) 350 MG/ML injection (mL)  Total volume: 60 mL Date/Time Rate/Dose/Volume Action   01/17/24 1149 60 mL Given    Sedation Time  Sedation Time Physician-1: 28 minutes 23  seconds Contrast     Administrations occurring from 1051 to 1152 on 01/17/24:  Medication Name Total Dose  iohexol  (OMNIPAQUE ) 350 MG/ML injection 60 mL   Radiation/Fluoro  Fluoro time: 4.6 (min) DAP: 16.4 (Gycm2) Cumulative Air Kerma: 191.2 (mGy) Complications  Complications documented before study signed (01/17/2024 11:56 AM)   No complications were associated with this study.  Documented by Bevely Gosling, Shasta Terry Saunas - 01/17/2024 11:52 AM     Coronary Findings  Diagnostic Dominance: Right Left Main  Mid LM to Dist LM lesion is 40% stenosed.    Left Anterior Descending  Ost LAD to Prox LAD lesion is 75% stenosed. The lesion is calcified.  Mid LAD lesion is 70% stenosed. The lesion is calcified.    First Diagonal Branch  1st Diag lesion is 25% stenosed.    Left Circumflex  Ost Cx to Prox Cx lesion is 100% stenosed.    Third Obtuse Marginal Branch  Collaterals  3rd Mrg filled by collaterals from 1st Diag.      Right Coronary Artery  There is mild diffuse disease throughout the vessel.  Mid RCA lesion is 70% stenosed. The lesion is calcified.    Intervention   No interventions have been documented.   Coronary Diagrams  Diagnostic Dominance: Right  Intervention   Implants  No implant documentation for this case.   Syngo Images   Show images for CARDIAC CATHETERIZATION Images on Long Term Storage   Show images for Jeanell, Mangan to Procedure Log  Procedure Log    Hemo Data  Flowsheet Row Most Recent Value  Fick Cardiac Output 6.86 L/min  Fick Cardiac Output Index 3.58 (L/min)/BSA  RA A Wave 12 mmHg  RA V Wave 10 mmHg  RA Mean 8 mmHg  RV Systolic Pressure 58 mmHg  RV Diastolic Pressure 6 mmHg  RV EDP 12 mmHg  PA Systolic Pressure 53 mmHg  PA Diastolic Pressure 29 mmHg  PA Mean 40 mmHg  PW A Wave 25 mmHg  PW V Wave 44 mmHg  PW Mean 29 mmHg  AO Systolic Pressure 101 mmHg  AO Diastolic Pressure 61 mmHg  AO Mean 76  mmHg  QP/QS 0.78  TPVR Index 14.4 HRUI  TSVR Index 21.2 HRUI  PVR SVR Ratio 0.21  TPVR/TSVR Ratio 0.68    Narrative & Impression  CLINICAL DATA:  Severe Aortic Stenosis.   EXAM: Cardiac TAVR CT   TECHNIQUE: A non-contrast, gated CT scan was obtained with axial slices of 2.5 mm through the heart for aortic valve scoring. A 120 kV retrospective, gated, contrast cardiac scan was obtained. Gantry rotation speed was 230 msec and collimation was 0.63 mm. Nitroglycerin  was not given. The 3D dataset was reconstructed in systole with motion correction. The 3D data set was reconstructed in 5% intervals of the 0-95% of the R-R cycle. Systolic and diastolic phases were analyzed on a dedicated workstation using MPR, MIP, and VRT modes. The patient received 100 cc of contrast.   FINDINGS: Image quality: Excellent.   Noise artifact is: Limited.   Valve Morphology: Tricuspid aortic valve with diffuse severe calcifications.   Aortic Valve Calcium  score: 2646   Aortic annular dimension:   Phase assessed: 24%   Annular area: 467 mm2   Annular perimeter: 79.2 mm   Max diameter: 28.4 mm   Min diameter: 20.5 mm   Annular and subannular calcification: None.   Membranous septum length: 10.3 mm   Optimal coplanar projection: LAO 11 CAU 5   Coronary Artery Height above Annulus:   Left Main: 12.6 mm   Right Coronary: 14.9 mm   Sinus of Valsalva Measurements:   Non-coronary: 32 mm   Right-coronary: 30 mm   Left-coronary: 31 mm   Sinus of Valsalva Height:   Non-coronary: 22.4 mm   Right-coronary: 21.8 mm   Left-coronary: 20.1 mm   Sinotubular Junction: 29 mm   Ascending Thoracic Aorta: 33 mm   Coronary Arteries: Normal coronary origin. Right dominance. The study was performed without use of NTG and is insufficient for plaque evaluation. Please refer to recent cardiac catheterization for coronary assessment.   Cardiac Morphology:   Right Atrium: Right atrial  size is dilated.   Right Ventricle: The right ventricular cavity is within normal limits.   Left Atrium: Left atrial size is dilated with no left atrial appendage filling defect.   Left Ventricle: The ventricular cavity size is dilated.   Pulmonary arteries: Dilated pulmonary artery suggestive of pulmonary hypertension.   Pulmonary veins: Normal pulmonary venous drainage.   Pericardium: Normal thickness with no significant effusion or calcium  present.   Mitral Valve: The mitral valve is degenerative with mild to moderate calcium .   Extra-cardiac findings: See attached radiology report for non-cardiac structures.   IMPRESSION: 1. Tricuspid aortic valve with severe aortic stenosis.   2.  Annular measurements support a 26 mm S3 or 29 mm Evolut Pro.   3. No significant annular or subannular calcifications.   4. Sufficient coronary to annulus distance.   5. Optimal Fluoroscopic Angle for Delivery: LAO 11 CAU 5   6. Dilated pulmonary artery suggestive of pulmonary hypertension.   Darryle T. Barbaraann, MD   Electronically Signed: By: Darryle Barbaraann M.D. On: 02/23/2024 15:09     Narrative & Impression  CLINICAL DATA:  Aortic valve replacement, preoperative evaluation   EXAM: CTA ABDOMEN AND PELVIS WITHOUT AND WITH CONTRAST   TECHNIQUE: Multidetector CT imaging of the abdomen and pelvis was performed using the standard protocol during bolus administration of intravenous contrast. Multiplanar reconstructed images and MIPs were obtained and reviewed to evaluate the vascular anatomy.   RADIATION DOSE REDUCTION: This exam was performed according to the departmental dose-optimization program which includes automated exposure control, adjustment of the mA and/or kV according to patient size and/or use of iterative reconstruction technique.   CONTRAST:  OMNIPAQUE  IOHEXOL  350 MG/ML SOLN   COMPARISON:  CT renal stone protocol performed December 25, 2023    FINDINGS: VASCULAR   Aorta: Mild diffuse atherosclerotic changes. Minimal diameter in the infrarenal segment is estimated at 6 mm.   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   SMA: Occluded in the proximal segment with estimated length of the occlusion measuring approximately 2.5 cm with underlying dense atherosclerotic plaque.   Renals: Single right renal artery with moderate stenosis in the proximal segment. Single left renal artery with moderate stenosis in the proximal segment.   IMA: Mild to moderate disease is present in the proximal segment, patent.   Inflow: The right common iliac artery is patent with underlying moderate atherosclerotic plaque. Minimal diameter is estimated at 2.8 mm. Severe disease is present in the proximal right internal iliac artery. The right external iliac artery is patent with underlying moderate atherosclerotic plaque and minimal diameter estimated at 3.5 mm.   Moderate plaque is present in the left common iliac artery with minimal diameter estimated at 2.8 mm. Severe disease is present in the left internal iliac artery. Moderate to severe plaque is present in the left external iliac artery with minimal diameter estimated at 2.1 mm.   Proximal Outflow: There is dense calcified plaque in the right common femoral artery with minimal diameter estimated at 2 mm. There is dense calcified plaque in the left common femoral artery with minimal diameter estimated at 3 mm.   Stent material is partially captured in the left SFA.   Veins: No obvious venous abnormality within the limitations of this arterial phase study.   Review of the MIP images confirms the above findings.   NON-VASCULAR   Lower chest: Reported separately.   Hepatobiliary: Nodular liver surface margin. Gallbladder is contracted.   Pancreas: Unremarkable. No pancreatic ductal dilatation or surrounding inflammatory changes.    Spleen: Spleen is enlarged measuring 14.2 cm.   Adrenals/Urinary Tract: Mild diffuse thickening of the adrenal glands. No hydronephrosis. Urinary bladder is grossly unremarkable.   Stomach/Bowel: No dilated loops of bowel are appreciated.   Lymphatic: No significant lymphadenopathy.   Reproductive: Uterus and bilateral adnexa are unremarkable.   Other: Nothing significant.   Musculoskeletal: Degenerative changes in the lumbar spine.   IMPRESSION: 1. 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 2. Moderate to severe inflow and access vessel disease. The minimal diameter in  the right iliac system is estimated at 2.8 mm. Severe right common femoral disease is present. The minimal diameter in the left iliac system is estimated at 2.1 mm with moderate disease present in the left common femoral artery.     Electronically Signed   By: Maude Naegeli M.D.   On: 02/21/2024 07:39      Impression:  This 58 year old woman has stage D, severe, symptomatic, low-flow/low gradient aortic stenosis with NYHA class II symptoms of exertional fatigue and shortness of breath consistent with chronic diastolic congestive heart failure.  She had an admission in July 2025 for acute heart failure and echocardiogram at that time showed an ejection fraction of 25%.  I have personally reviewed her 2D echocardiogram, cardiac catheterization, and CTA studies.  Her most recent complete echo was on 11/20/2023 and showed a severely calcified and thickened aortic valve with restricted leaflet mobility.  The mean gradient was 22 mmHg with a valve area of 0.82 cm with a very low stroke-volume index of 20.  Cardiac catheterization showed stable multivessel coronary disease with moderate pulmonary hypertension.  She has no anginal symptoms and this has been treated medically.  She has relatively small diffusely diseased vessels.  I agree that  aortic valve replacement is indicated in this patient for relief of her symptoms and to prevent progressive left ventricular dysfunction and recurrent acute heart failure.  Given her low EF and comorbidities I do not think she is a candidate for open surgical aortic valve replacement.  I think transcatheter aortic valve replacement would be a reasonable alternative for treating her.  Her gated cardiac CTA shows anatomy suitable for TAVR using a 26 mm SAPIEN 3 valve.  She has severe aortoiliac and femoral calcific atherosclerosis that would probably preclude a safe transfemoral approach.  I think alternative access via the right common carotid artery or left subclavian artery would be possible.  The patient was counseled at length regarding treatment alternatives for management of severe symptomatic aortic stenosis. The risks and benefits of surgical intervention has been discussed in detail. Long-term prognosis with medical therapy was discussed. Alternative approaches such as conventional surgical aortic valve replacement, transcatheter aortic valve replacement, and palliative medical therapy were compared and contrasted at length. This discussion was placed in the context of the patient's own specific clinical presentation and past medical history. All of her questions have been addressed.   Following the decision to proceed with transcatheter aortic valve replacement, a discussion was held regarding what types of management strategies would be attempted intraoperatively in the event of life-threatening complications, including whether or not the patient would be considered a candidate for the use of cardiopulmonary bypass and/or conversion to open sternotomy for attempted surgical intervention.  Given her degree of vascular disease and comorbidities I do not think she is a candidate for emergent sternotomy to manage any intraoperative complications.  The patient has been advised of a variety of complications  that might develop including but not limited to risks of death, stroke, paravalvular leak, aortic dissection or other major vascular complications, aortic annulus rupture, device embolization, cardiac rupture or perforation, mitral regurgitation, acute myocardial infarction, arrhythmia, heart block or bradycardia requiring permanent pacemaker placement, congestive heart failure, respiratory failure, renal failure, pneumonia, infection, other late complications related to structural valve deterioration or migration, or other complications that might ultimately cause a temporary or permanent loss of functional independence or other long term morbidity. The patient provides full informed consent for the procedure as described and all  questions were answered.      Plan:  She will be scheduled for TAVR using a SAPIEN 3 valve with possible right common carotid artery or left subclavian artery access.  I spent 60 minutes performing this consultation and > 50% of this time was spent face to face counseling and coordinating the care of this patient's severe aortic stenosis   Dorise LOIS Fellers, MD 03/18/2024

## 2024-03-22 ENCOUNTER — Other Ambulatory Visit (HOSPITAL_COMMUNITY): Payer: Self-pay

## 2024-03-24 ENCOUNTER — Ambulatory Visit (HOSPITAL_BASED_OUTPATIENT_CLINIC_OR_DEPARTMENT_OTHER)
Admission: RE | Admit: 2024-03-24 | Discharge: 2024-03-24 | Disposition: A | Source: Ambulatory Visit | Attending: Vascular Surgery

## 2024-03-24 ENCOUNTER — Ambulatory Visit (HOSPITAL_COMMUNITY)
Admission: RE | Admit: 2024-03-24 | Discharge: 2024-03-24 | Disposition: A | Discharge status: Home / Self Care | Source: Ambulatory Visit | Attending: Cardiology | Admitting: Cardiology

## 2024-03-24 ENCOUNTER — Other Ambulatory Visit (HOSPITAL_COMMUNITY): Payer: Self-pay | Admitting: Family Medicine

## 2024-03-24 ENCOUNTER — Ambulatory Visit (HOSPITAL_COMMUNITY)
Admission: RE | Admit: 2024-03-24 | Discharge: 2024-03-24 | Disposition: A | Discharge status: Home / Self Care | Source: Ambulatory Visit | Attending: Vascular Surgery | Admitting: Vascular Surgery

## 2024-03-24 ENCOUNTER — Other Ambulatory Visit (HOSPITAL_COMMUNITY): Payer: Self-pay

## 2024-03-24 ENCOUNTER — Ambulatory Visit (INDEPENDENT_AMBULATORY_CARE_PROVIDER_SITE_OTHER): Admitting: Vascular Surgery

## 2024-03-24 ENCOUNTER — Encounter: Payer: Self-pay | Admitting: Vascular Surgery

## 2024-03-24 VITALS — BP 105/65 | HR 90 | Temp 98.1°F | Resp 18 | Ht 64.0 in | Wt 189.0 lb

## 2024-03-24 DIAGNOSIS — I5022 Chronic systolic (congestive) heart failure: Secondary | ICD-10-CM | POA: Diagnosis present

## 2024-03-24 DIAGNOSIS — I739 Peripheral vascular disease, unspecified: Secondary | ICD-10-CM | POA: Insufficient documentation

## 2024-03-24 DIAGNOSIS — I70223 Atherosclerosis of native arteries of extremities with rest pain, bilateral legs: Secondary | ICD-10-CM | POA: Insufficient documentation

## 2024-03-24 LAB — VAS US ABI WITH/WO TBI

## 2024-03-24 MED FILL — Torsemide Tab 100 MG: ORAL | 30 days supply | Qty: 60 | Fill #0 | Status: AC

## 2024-03-24 NOTE — Progress Notes (Signed)
 Patient name: Kayla Schmitt MRN: [id number] DOB: 1965/08/16 Sex: female  REASON FOR VISIT: 3 month follow-up PAD  HPI: Kayla Schmitt is a 58 y.o. female with history of hypertension, hyperlipidemia, diabetes, CKD, ischemic cardiomyopathy, severe aortic stenosis that presents for 3 month follow-up of PAD.    Previously underwent bilateral lower extremity SFA pop stenting for CLI.  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  She states her stump is healing.  We are following her right SFA popliteal stents.  These last underwent laser arthrectomy with DCB on 08/06/2022 for in-stent stenosis.    States she has had some trouble with her breathing and is scheduled for TAVR around December 12  Past Medical History:  Diagnosis Date   Anemia    Aortic stenosis    Arthritis    CHF (congestive heart failure) (HCC)    Coronary artery disease    Diabetes mellitus without complication (HCC)    type 2   GERD (gastroesophageal reflux disease)    Heart murmur    History of blood transfusion    Hypercholesteremia    Hypertension    Hypothyroidism    Mitral regurgitation    Myocardial infarction (HCC)    Peripheral vascular disease    PONV (postoperative nausea and vomiting)    Thyroid  disease     Past Surgical History:  Procedure Laterality Date   ABDOMINAL AORTOGRAM W/LOWER EXTREMITY Bilateral 01/14/2020   Procedure: ABDOMINAL AORTOGRAM W/LOWER EXTREMITY;  Surgeon: Gretta Lonni PARAS, MD;  Location: [facility];  Service: Cardiovascular;  Laterality: Bilateral;   ABDOMINAL AORTOGRAM W/LOWER EXTREMITY N/A 07/05/2022   Procedure: ABDOMINAL AORTOGRAM W/LOWER EXTREMITY;  Surgeon: Gretta Lonni PARAS, MD;  Location: [facility];  Service: Cardiovascular;  Laterality: N/A;   ABDOMINAL AORTOGRAM W/LOWER EXTREMITY N/A 08/02/2022   Procedure: ABDOMINAL AORTOGRAM W/LOWER EXTREMITY;  Surgeon: Gretta Lonni PARAS,  MD;  Location: [facility];  Service: Cardiovascular;  Laterality: N/A;   ABDOMINAL AORTOGRAM W/LOWER EXTREMITY N/A 08/06/2022   Procedure: ABDOMINAL AORTOGRAM W/LOWER EXTREMITY;  Surgeon: Gretta Lonni PARAS, MD;  Location: [facility];  Service: Cardiovascular;  Laterality: N/A;   AMPUTATION Bilateral 07/20/2022   Procedure: AMPUTATION RIGHT GREAT TOE AND SECOND TOE, AMPUTATION LEFT GREAT TOE;  Surgeon: Harden Jerona GAILS, MD;  Location: [facility] OR;  Service: Orthopedics;  Laterality: Bilateral;   AMPUTATION Bilateral 08/08/2022   Procedure: BILATERAL TRANSMETATARSAL AMPUTATION;  Surgeon: Harden Jerona GAILS, MD;  Location: [facility] OR;  Service: Orthopedics;  Laterality: Bilateral;   AMPUTATION Left 10/22/2023   Procedure: AMPUTATION BELOW KNEE;  Surgeon: Lanis Fonda BRAVO, MD;  Location: [facility] OR;  Service: Vascular;  Laterality: Left;   AMPUTATION TOE     BIOPSY  04/06/2023   Procedure: BIOPSY;  Surgeon: Nasim Elspeth SQUIBB, MD;  Location: [facility] ENDOSCOPY;  Service: Gastroenterology;;   COLONOSCOPY WITH PROPOFOL  N/A 04/06/2023   Procedure: COLONOSCOPY WITH PROPOFOL ;  Surgeon: Siomara Elspeth SQUIBB, MD;  Location: [facility] ENDOSCOPY;  Service: Gastroenterology;  Laterality: N/A;   CYSTOSCOPY WITH STENT PLACEMENT N/A 11/30/2023   Procedure: CYSTOSCOPY, WITH FULGURATION OF BLEEDING AND CLOT EVACUATION;  Surgeon: Roseann Adine PARAS., MD;  Location: [facility] OR;  Service: Urology;  Laterality: N/A;  CLOT EVACUATION OF BLADDER   ESOPHAGOGASTRODUODENOSCOPY (EGD) WITH PROPOFOL  N/A 04/06/2023   Procedure: ESOPHAGOGASTRODUODENOSCOPY (EGD) WITH PROPOFOL ;  Surgeon: Jaleen Elspeth SQUIBB, MD;  Location: [facility] ENDOSCOPY;  Service: Gastroenterology;  Laterality: N/A;   GIVENS CAPSULE STUDY  N/A 04/06/2023   Procedure: GIVENS CAPSULE STUDY;  Surgeon: Kimmy Elspeth SQUIBB, MD;  Location: [facility] ENDOSCOPY;  Service: Gastroenterology;  Laterality: N/A;   PERIPHERAL VASCULAR ATHERECTOMY Right 01/14/2020   Procedure: PERIPHERAL VASCULAR ATHERECTOMY;   Surgeon: Gretta Lonni PARAS, MD;  Location: [facility] INVASIVE CV LAB;  Service: Cardiovascular;  Laterality: Right;  sfa   PERIPHERAL VASCULAR INTERVENTION Right 01/14/2020   Procedure: PERIPHERAL VASCULAR INTERVENTION;  Surgeon: Gretta Lonni PARAS, MD;  [facility]: [facility];  Service: Cardiovascular;  Laterality: Right;  sfa stent    PERIPHERAL VASCULAR INTERVENTION  07/05/2022   Procedure: PERIPHERAL VASCULAR INTERVENTION;  Surgeon: Gretta Lonni PARAS, MD;  [facility]: [facility];  Service: Cardiovascular;;   PERIPHERAL VASCULAR INTERVENTION  08/02/2022   Procedure: PERIPHERAL VASCULAR INTERVENTION;  Surgeon: Gretta Lonni PARAS, MD;  [facility]: [facility];  Service: Cardiovascular;;   PERIPHERAL VASCULAR INTERVENTION  08/06/2022   Procedure: PERIPHERAL VASCULAR INTERVENTION;  Surgeon: Gretta Lonni PARAS, MD;  Location: [facility] INVASIVE CV LAB;  Service: Cardiovascular;;   PERIPHERAL VASCULAR THROMBECTOMY  08/02/2022   Procedure: PERIPHERAL VASCULAR THROMBECTOMY;  Surgeon: Gretta Lonni PARAS, MD;  [facility]: [facility];  Service: Cardiovascular;;   REVISION AMPUTATION, BELOW THE KNEE Left 10/23/2023   Procedure: REVISION AMPUTATION, BELOW THE KNEE;  Surgeon: Harden Jerona GAILS, MD;  Location: [facility] OR;  Service: Orthopedics;  Laterality: Left;  REVISION LEFT BELOW KNEE AMPUTATION   RIGHT HEART CATH N/A 11/29/2023   Procedure: RIGHT HEART CATH;  Surgeon: Rolan Ezra RAMAN, MD;  Location: [facility] INVASIVE CV LAB;  Service: Cardiovascular;  Laterality: N/A;   RIGHT HEART CATH AND CORONARY ANGIOGRAPHY N/A 01/17/2024   Procedure: RIGHT HEART CATH AND CORONARY ANGIOGRAPHY;  Surgeon: Wendel Lurena POUR, MD;  Location: [facility];  Service: Cardiovascular;  Laterality: N/A;   RIGHT/LEFT HEART CATH AND CORONARY ANGIOGRAPHY N/A 08/23/2022   Procedure: RIGHT/LEFT HEART CATH AND CORONARY ANGIOGRAPHY;  Surgeon: Dann Candyce RAMAN, MD;  Location: [facility] INVASIVE CV LAB;  Service: Cardiovascular;   Laterality: N/A;   SKIN SPLIT GRAFT Right 03/18/2020   Procedure: SKIN GRAFTING RIGHT FOOT ULCER;  Surgeon: Harden Jerona GAILS, MD;  Location: [facility] OR;  Service: Orthopedics;  Laterality: Right;   STUMP REVISION Right 11/30/2022   Procedure: REVISION RIGHT TRANSMETATARSAL AMPUTATION;  Surgeon: Harden Jerona GAILS, MD;  Location: [facility] OR;  Service: Orthopedics;  Laterality: Right;   TEE WITHOUT CARDIOVERSION N/A 08/27/2022   Procedure: TRANSESOPHAGEAL ECHOCARDIOGRAM;  Surgeon: Hobart Powell BRAVO, MD;  Location: [facility] INVASIVE CV LAB;  Service: Cardiovascular;  Laterality: N/A;   TRANSESOPHAGEAL ECHOCARDIOGRAM (CATH LAB) N/A 10/24/2023   Procedure: TRANSESOPHAGEAL ECHOCARDIOGRAM;  Surgeon: Michele Richardson, DO;  Location: [facility];  Service: Cardiovascular;  Laterality: N/A;   WISDOM TOOTH EXTRACTION      Family History  Problem Relation Age of Onset   Diabetes Other    CAD Other     SOCIAL HISTORY: Social History   Tobacco Use   Smoking status: Former    Current packs/day: 0.00    Types: Cigarettes    Quit date: 01/2017    Years since quitting: 7.1   Smokeless tobacco: Never  Substance Use Topics   Alcohol use: Not Currently    Allergies  Allergen Reactions   Trental  [Pentoxifylline ] Nausea And Vomiting   Vibramycin [Doxycycline] Nausea And Vomiting    Current Outpatient Medications  Medication Sig Dispense Refill   aspirin  EC 81 MG tablet Take 1 tablet (81 mg total) by mouth  daily. 30 tablet 3   atorvastatin  (LIPITOR) 40 MG tablet Take 40 mg by mouth daily.     clopidogrel  (PLAVIX ) 75 MG tablet Take 1 tablet (75 mg total) by mouth daily. 90 tablet 3   diphenhydrAMINE  HCl, Sleep, (ZZZQUIL) 25 MG CAPS Take 25 mg by mouth at bedtime.     ferrous sulfate  325 (65 FE) MG EC tablet Take 1 tablet (325 mg total) by mouth in the morning and at bedtime. 60 tablet 0   Furosemide  (FUROSCIX ) 80 MG/10ML CTKT Inject 80 mg into the skin daily as needed. ONLY AS DIRECT BY THE ADVANCED HEART FAILURE  CLINIC 2 each 1   gabapentin  (NEURONTIN ) 300 MG capsule Take 300 mg by mouth at bedtime.     insulin  degludec (TRESIBA  FLEXTOUCH) 100 UNIT/ML FlexTouch Pen Inject 6 Units into the skin at bedtime.     levothyroxine  (SYNTHROID ) 150 MCG tablet Take 150 mcg by mouth daily before breakfast.     metolazone  (ZAROXOLYN ) 2.5 MG tablet Take 1 tablet (2.5 mg total) by mouth once a week. Every Tuesday. Take an extra 40 mEq of Potassium with Metolazone  (Patient taking differently: Take 2.5 mg by mouth once a week. Every Sunday. Take an extra 40 mEq of Potassium with Metolazone ) 20 tablet 3   mexiletine (MEXITIL) 150 MG capsule Take 1 capsule (150 mg total) by mouth 2 (two) times daily. 60 capsule 3   midodrine  (PROAMATINE ) 2.5 MG tablet Take 1 tablet (2.5 mg total) by mouth 2 (two) times daily with a meal. 60 tablet 3   nitroGLYCERIN  (NITROSTAT ) 0.4 MG SL tablet Place 1 tablet (0.4 mg total) under the tongue every 5 (five) minutes x 3 doses as needed for chest pain (if no relief after 3rd dose proceed to ED or call 911). 11/06/2022-New 25 tablet 3   OXYGEN  Inhale 2 L into the lungs continuous.     pantoprazole  (PROTONIX ) 40 MG tablet Take 40 mg by mouth daily.     polyethylene glycol (MIRALAX  / GLYCOLAX ) 17 g packet Take 17 g by mouth 2 (two) times daily.     potassium chloride  (KLOR-CON  M) 20 MEQ tablet Take 60 meq in am and 40 meq in pm (Patient taking differently: 40 mEq 2 (two) times daily.) 150 tablet 11   promethazine  (PHENERGAN ) 25 MG tablet Take 1 tablet (25 mg total) by mouth every 6 (six) hours as needed for nausea or vomiting. 20 tablet 0   torsemide  (DEMADEX ) 100 MG tablet Take 1 tablet (100 mg total) by mouth 2 (two) times daily. 60 tablet 1   No current facility-administered medications for this visit.    REVIEW OF SYSTEMS:  [X]  denotes positive finding, [ ]  denotes negative finding Cardiac  Comments:  Chest pain or chest pressure:    Shortness of breath upon exertion:    Short of breath  when lying flat:    Irregular heart rhythm:        Vascular    Pain in calf, thigh, or hip brought on by ambulation:    Pain in feet at night that wakes you up from your sleep:     Blood clot in your veins:    Leg swelling:         Pulmonary    Oxygen  at home:    Productive cough:     Wheezing:         Neurologic    Sudden weakness in arms or legs:     Sudden numbness in arms or  legs:     Sudden onset of difficulty speaking or slurred speech:    Temporary loss of vision in one eye:     Problems with dizziness:         Gastrointestinal    Blood in stool:     Vomited blood:         Genitourinary    Burning when urinating:     Blood in urine:        Psychiatric    Major depression:         Hematologic    Bleeding problems:    Problems with blood clotting too easily:        Skin    Rashes or ulcers:        Constitutional    Fever or chills:      PHYSICAL EXAM: Vitals:   03/24/24 1549  BP: 105/65  Pulse: 90  Resp: 18  Temp: 98.1 F (36.7 C)  TempSrc: Temporal  SpO2: 92%  Weight: 189 lb (85.7 kg)  Height: 5' 4 (1.626 m)     GENERAL: The patient is a well-nourished female, in no acute distress. The vital signs are documented above. CARDIAC: There is a regular rate and rhythm.  VASCULAR:  Bilateral femoral pulses palpable Right TMA healed except small sinus on lateral margin Left BKA   DATA:   ABIs today are [place] on the right  Lower extremity arterial duplex shows patent possible short segment right SFA occlusion with monophasic flow  Assessment/Plan:  58 y.o. female with history of hypertension, hyperlipidemia, diabetes, CKD, ischemic cardiomyopathy, severe aortic stenosis that presents for 3 month follow-up of PAD.    Previously underwent bilateral lower extremity SFA pop stenting for CLI.  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  We are following her right SFA popliteal stents.   These last underwent laser arthrectomy with DCB on 08/06/2022. Concerned on duplex today that she has a short segment occlusion in the SFA on the right with monophasic flow.  Discussed I would recommend aortogram, lower extremity arteriogram with a focus on the right leg for her in-stent stenosis versus short focal occlusion.  TMA continues looks viable although does have a small sinus in the TMA.  Want to make sure she is optimized.  I do want to schedule this after her TAVR as I think that takes priority that is scheduled early December.   Lonni DOROTHA Gaskins, MD [facility] [place] Office: [phone]

## 2024-03-30 ENCOUNTER — Other Ambulatory Visit (HOSPITAL_COMMUNITY): Payer: Self-pay

## 2024-03-30 ENCOUNTER — Other Ambulatory Visit: Payer: Self-pay

## 2024-03-30 DIAGNOSIS — I70223 Atherosclerosis of native arteries of extremities with rest pain, bilateral legs: Secondary | ICD-10-CM

## 2024-03-31 ENCOUNTER — Other Ambulatory Visit: Payer: Self-pay

## 2024-03-31 DIAGNOSIS — I350 Nonrheumatic aortic (valve) stenosis: Secondary | ICD-10-CM

## 2024-04-02 ENCOUNTER — Ambulatory Visit (HOSPITAL_COMMUNITY)

## 2024-04-02 ENCOUNTER — Encounter (HOSPITAL_COMMUNITY): Admitting: Internal Medicine

## 2024-04-03 ENCOUNTER — Inpatient Hospital Stay (HOSPITAL_COMMUNITY)
Admission: RE | Admit: 2024-04-03 | Discharge: 2024-04-03 | Disposition: A | Source: Ambulatory Visit | Attending: Internal Medicine

## 2024-04-03 ENCOUNTER — Other Ambulatory Visit (HOSPITAL_COMMUNITY)

## 2024-04-03 ENCOUNTER — Other Ambulatory Visit: Payer: Self-pay

## 2024-04-03 ENCOUNTER — Ambulatory Visit (HOSPITAL_COMMUNITY)
Admission: RE | Admit: 2024-04-03 | Discharge: 2024-04-03 | Disposition: A | Source: Ambulatory Visit | Attending: Internal Medicine

## 2024-04-03 DIAGNOSIS — Z01818 Encounter for other preprocedural examination: Secondary | ICD-10-CM

## 2024-04-03 DIAGNOSIS — I350 Nonrheumatic aortic (valve) stenosis: Secondary | ICD-10-CM

## 2024-04-03 LAB — COMPREHENSIVE METABOLIC PANEL WITH GFR
ALT: 43 U/L (ref 0–44)
AST: 37 U/L (ref 15–41)
Albumin: 3 g/dL — ABNORMAL LOW (ref 3.5–5.0)
Alkaline Phosphatase: 140 U/L — ABNORMAL HIGH (ref 38–126)
Anion gap: 9 (ref 5–15)
BUN: 59 mg/dL — ABNORMAL HIGH (ref 6–20)
CO2: 26 mmol/L (ref 22–32)
Calcium: 9.3 mg/dL (ref 8.9–10.3)
Chloride: 103 mmol/L (ref 98–111)
Creatinine, Ser: 2.52 mg/dL — ABNORMAL HIGH (ref 0.44–1.00)
GFR, Estimated: 22 mL/min — ABNORMAL LOW (ref >60)
Glucose, Bld: 236 mg/dL — ABNORMAL HIGH (ref 70–99)
Potassium: 4.6 mmol/L (ref 3.5–5.1)
Sodium: 138 mmol/L (ref 135–145)
Total Bilirubin: 1 mg/dL (ref 0.0–1.2)
Total Protein: 7.8 g/dL (ref 6.5–8.1)

## 2024-04-03 LAB — CBC
HCT: 32.3 % — ABNORMAL LOW (ref 36.0–46.0)
Hemoglobin: 9.1 g/dL — ABNORMAL LOW (ref 12.0–15.0)
MCH: 26.7 pg (ref 26.0–34.0)
MCHC: 28.2 g/dL — ABNORMAL LOW (ref 30.0–36.0)
MCV: 94.7 fL (ref 80.0–100.0)
Platelets: 249 K/uL (ref 150–400)
RBC: 3.41 MIL/uL — ABNORMAL LOW (ref 3.87–5.11)
RDW: 19.7 % — ABNORMAL HIGH (ref 11.5–15.5)
WBC: 6.1 K/uL (ref 4.0–10.5)
nRBC: 0 % (ref 0.0–0.2)

## 2024-04-03 LAB — URINALYSIS, ROUTINE W REFLEX MICROSCOPIC
Bilirubin Urine: NEGATIVE
Glucose, UA: NEGATIVE mg/dL
Hgb urine dipstick: NEGATIVE
Ketones, ur: NEGATIVE mg/dL
Leukocytes,Ua: NEGATIVE
Nitrite: NEGATIVE
Protein, ur: NEGATIVE mg/dL
Specific Gravity, Urine: 1.008 (ref 1.005–1.030)
pH: 6 (ref 5.0–8.0)

## 2024-04-03 LAB — PROTIME-INR
INR: 1.2 (ref 0.8–1.2)
Prothrombin Time: 16.2 s — ABNORMAL HIGH (ref 11.4–15.2)

## 2024-04-03 LAB — SURGICAL PCR SCREEN
MRSA, PCR: POSITIVE — AB
Staphylococcus aureus: POSITIVE — AB

## 2024-04-03 NOTE — Progress Notes (Addendum)
 All consents signed by patient at PAT lab appointment. Pt was sent home with printed copy of surgical instructions and CHG soap/CHG soap instructions. All instructions reviewed with patient and questions answered.  Patients chart send to anesthesia for review. Pt denies any respiratory illness/infection in the last two months.   Pt states she has not had to increase her use of oxygen  lately. She is still on 1L [place] during the day and 2-3L [place] at night.  Type and Screen to be collected DOS due to transfusion within the last three months. Order modified to reflect change.

## 2024-04-03 NOTE — Progress Notes (Signed)
 Notified Tinnie Daring of patient's positive MRSA result.

## 2024-04-06 MED ORDER — POTASSIUM CHLORIDE 2 MEQ/ML IV SOLN
80.0000 meq | INTRAVENOUS | Status: DC
  Filled 2024-04-06: qty 40

## 2024-04-06 MED ORDER — VANCOMYCIN HCL IN DEXTROSE 1-5 GM/200ML-% IV SOLN
1000.0000 mg | INTRAVENOUS | Status: AC
  Administered 2024-04-07: 1000 mg via INTRAVENOUS
  Filled 2024-04-06 (×2): qty 200

## 2024-04-06 MED ORDER — MAGNESIUM SULFATE 50 % IJ SOLN
40.0000 meq | INTRAMUSCULAR | Status: DC
  Filled 2024-04-06: qty 9.85

## 2024-04-06 MED ORDER — DEXMEDETOMIDINE HCL IN NACL 400 MCG/100ML IV SOLN
0.1000 ug/kg/h | INTRAVENOUS | Status: DC
  Filled 2024-04-06: qty 100

## 2024-04-06 MED ORDER — CEFAZOLIN SODIUM-DEXTROSE 2-4 GM/100ML-% IV SOLN
2.0000 g | INTRAVENOUS | Status: AC
  Administered 2024-04-07: 2 g via INTRAVENOUS
  Filled 2024-04-06 (×2): qty 100

## 2024-04-06 MED ORDER — HEPARIN 30,000 UNITS/1000 ML (OHS) CELLSAVER SOLUTION
Status: DC
  Filled 2024-04-06: qty 1000

## 2024-04-06 MED ORDER — NOREPINEPHRINE 4 MG/250ML-% IV SOLN
0.0000 ug/min | INTRAVENOUS | Status: AC
  Administered 2024-04-07: 2 ug/min via INTRAVENOUS
  Filled 2024-04-06: qty 250

## 2024-04-06 NOTE — H&P (Addendum)
 Patient ID: Kayla Schmitt, female   DOB: 1965/07/15, 58 y.o.   MRN: [id number]  [facility]   MULTIDISCIPLINARY HEART VALVE CLINIC           CARDIOTHORACIC SURGERY ADMISSION HISTORY AND PHYSICAL   PCP is [name], NP Referring Provider is Lurena Red, MD Primary Cardiologist is Diannah SHAUNNA Maywood, MD   Reason for admission: Severe aortic stenosis   HPI:   The patient is a 58 year old woman with a history of ischemic cardiomyopathy with an ejection fraction of 25 to 30% by echo in July 2025, coronary artery disease deferred for CABG in 2024, hypertension, hyperlipidemia, hypothyroidism, type 2 diabetes, stage IIIa chronic kidney disease, GI bleeding with known AVMs, severe peripheral vascular disease status post bilateral SFA stents, bilateral TMA, and 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  She is currently wheelchair-bound and awaiting a left lower extremity prosthesis.  She had some problems with wound healing after her amputation but it is essentially completely healed at this time.   She was admitted to [facility] in July 2025 with acute heart failure and required intubation due to flash pulmonary edema.  She improved with diuresis and inotropes.  She required transient CRRT.  She improved and was extubated and treated with GDMT.  She was followed up in heart failure clinic in September and was doing well.  Right/left heart cath on 01/17/2024 showed multivessel coronary disease that was relatively unchanged from her prior study with an occluded left circumflex, high-grade mid LAD, and moderate right coronary stenosis.  She has not been having any anginal pain and medical therapy was continued.  Her PA pressure was 53/29 with a mean of 40.  Wedge pressure was 29 with V waves to 44.  PVR was 1.6 Woods units with a PA PI of 3.  Her last complete echo was on  11/20/2023 and showed a calcified and thickened aortic valve with restricted leaflet mobility.  The mean gradient was 22 mmHg with a valve area by VTI of 0.82 cm.  Left ventricular ejection fraction was 25 to 30% with global hypokinesis.  Stroke-volume index was low at 20.   She reports continued exertional shortness of breath as well as some orthopnea and leg swelling.  She denies any chest pain or pressure.  She has had no dizziness or syncope.       Past Medical History:  Diagnosis Date   Anemia     Aortic stenosis     Arthritis     CHF (congestive heart failure) (HCC)     Coronary artery disease     Diabetes mellitus without complication (HCC)      type 2   GERD (gastroesophageal reflux disease)     Heart murmur     History of blood transfusion     Hypercholesteremia     Hypertension     Hypothyroidism     Mitral regurgitation     Myocardial infarction [facility])     Peripheral vascular disease     PONV (postoperative nausea and vomiting)     Thyroid  disease                 Past Surgical History:  Procedure Laterality Date   ABDOMINAL AORTOGRAM W/LOWER EXTREMITY Bilateral 01/14/2020    Procedure: ABDOMINAL AORTOGRAM W/LOWER EXTREMITY;  Surgeon: Gretta Lonni PARAS, MD;  [facility]: [facility] IN[facility];  Service:  Cardiovascular;  Laterality: Bilateral;   ABDOMINAL AORTOGRAM W/LOWER EXTREMITY N/A 07/05/2022    Procedure: ABDOMINAL AORTOGRAM W/LOWER EXTREMITY;  Surgeon: Gretta Lonni PARAS, MD;  Location: [facility];  Service: Cardiovascular;  Laterality: N/A;   ABDOMINAL AORTOGRAM W/LOWER EXTREMITY N/A 08/02/2022    Procedure: ABDOMINAL AORTOGRAM W/LOWER EXTREMITY;  Surgeon: Gretta Lonni PARAS, MD;  Location: [facility];  Service: Cardiovascular;  Laterality: N/A;   ABDOMINAL AORTOGRAM W/LOWER EXTREMITY N/A 08/06/2022    Procedure: ABDOMINAL AORTOGRAM W/LOWER EXTREMITY;  Surgeon: Gretta Lonni PARAS, MD;  [facility]: [facility];  Service: Cardiovascular;   Laterality: N/A;   AMPUTATION Bilateral 07/20/2022    Procedure: AMPUTATION RIGHT GREAT TOE AND SECOND TOE, AMPUTATION LEFT GREAT TOE;  Surgeon: Harden Jerona GAILS, MD;  Location: [facility] OR;  Service: Orthopedics;  Laterality: Bilateral;   AMPUTATION Bilateral 08/08/2022    Procedure: BILATERAL TRANSMETATARSAL AMPUTATION;  Surgeon: Harden Jerona GAILS, MD;  Location: [facility] OR;  Service: Orthopedics;  Laterality: Bilateral;   AMPUTATION Left 10/22/2023    Procedure: AMPUTATION BELOW KNEE;  Surgeon: Lanis Fonda BRAVO, MD;  Location: [facility] OR;  Service: Vascular;  Laterality: Left;   AMPUTATION TOE       BIOPSY   04/06/2023    Procedure: BIOPSY;  Surgeon: Denaja Elspeth SQUIBB, MD;  Location: [facility] ENDOSCOPY;  Service: Gastroenterology;;   COLONOSCOPY WITH PROPOFOL  N/A 04/06/2023    Procedure: COLONOSCOPY WITH PROPOFOL ;  Surgeon: Ame Elspeth SQUIBB, MD;  Location: [facility] ENDOSCOPY;  Service: Gastroenterology;  Laterality: N/A;   CYSTOSCOPY WITH STENT PLACEMENT N/A 11/30/2023    Procedure: CYSTOSCOPY, WITH FULGURATION OF BLEEDING AND CLOT EVACUATION;  Surgeon: Roseann Adine PARAS., MD;  Location: [facility] OR;  Service: Urology;  Laterality: N/A;  CLOT EVACUATION OF BLADDER   ESOPHAGOGASTRODUODENOSCOPY (EGD) WITH PROPOFOL  N/A 04/06/2023    Procedure: ESOPHAGOGASTRODUODENOSCOPY (EGD) WITH PROPOFOL ;  Surgeon: Shakthi Elspeth SQUIBB, MD;  Location: [facility] ENDOSCOPY;  Service: Gastroenterology;  Laterality: N/A;   GIVENS CAPSULE STUDY N/A 04/06/2023    Procedure: GIVENS CAPSULE STUDY;  Surgeon: Makalya Elspeth SQUIBB, MD;  Location: [facility] ENDOSCOPY;  Service: Gastroenterology;  Laterality: N/A;   PERIPHERAL VASCULAR ATHERECTOMY Right 01/14/2020    Procedure: PERIPHERAL VASCULAR ATHERECTOMY;  Surgeon: Gretta Lonni PARAS, MD;  Location: [facility] INVASIVE CV LAB;  Service: Cardiovascular;  Laterality: Right;  sfa   PERIPHERAL VASCULAR INTERVENTION Right 01/14/2020    Procedure: PERIPHERAL VASCULAR INTERVENTION;  Surgeon: Gretta Lonni PARAS, MD;  [facility];  Service: Cardiovascular;  Laterality: Right;  sfa stent    PERIPHERAL VASCULAR INTERVENTION   07/05/2022    Procedure: PERIPHERAL VASCULAR INTERVENTION;  Surgeon: Gretta Lonni PARAS, MD;  [facility];  Service: Cardiovascular;;   PERIPHERAL VASCULAR INTERVENTION   08/02/2022    Procedure: PERIPHERAL VASCULAR INTERVENTION;  Surgeon: Gretta Lonni PARAS, MD;  [facility];  Service: Cardiovascular;;   PERIPHERAL VASCULAR INTERVENTION   08/06/2022    Procedure: PERIPHERAL VASCULAR INTERVENTION;  Surgeon: Gretta Lonni PARAS, MD;  Location: [facility] INVASIVE CV LAB;  Service: Cardiovascular;;   PERIPHERAL VASCULAR THROMBECTOMY   08/02/2022    Procedure: PERIPHERAL VASCULAR THROMBECTOMY;  Surgeon: Gretta Lonni PARAS, MD;  [facility];  Service: Cardiovascular;;   REVISION AMPUTATION, BELOW THE KNEE Left 10/23/2023    Procedure: REVISION AMPUTATION, BELOW THE KNEE;  Surgeon: Harden Jerona GAILS, MD;  Location: [facility] OR;  Service: Orthopedics;  Laterality: Left;  REVISION LEFT BELOW KNEE AMPUTATION   RIGHT HEART CATH  N/A 11/29/2023    Procedure: RIGHT HEART CATH;  Surgeon: Rolan Ezra RAMAN, MD;  Location: [facility] INVASIVE CV LAB;  Service: Cardiovascular;  Laterality: N/A;   RIGHT HEART CATH AND CORONARY ANGIOGRAPHY N/A 01/17/2024    Procedure: RIGHT HEART CATH AND CORONARY ANGIOGRAPHY;  Surgeon: Wendel Lurena POUR, MD;  [facility];  Service: Cardiovascular;  Laterality: N/A;   RIGHT/LEFT HEART CATH AND CORONARY ANGIOGRAPHY N/A 08/23/2022    Procedure: RIGHT/LEFT HEART CATH AND CORONARY ANGIOGRAPHY;  Surgeon: Dann Candyce RAMAN, MD;  Location: [facility] INVASIVE CV LAB;  Service: Cardiovascular;  Laterality: N/A;   SKIN SPLIT GRAFT Right 03/18/2020    Procedure: SKIN GRAFTING RIGHT FOOT ULCER;  Surgeon: Harden Jerona GAILS, MD;  Location: [facility] OR;  Service: Orthopedics;  Laterality: Right;   STUMP REVISION Right 11/30/2022    Procedure: REVISION RIGHT  TRANSMETATARSAL AMPUTATION;  Surgeon: Harden Jerona GAILS, MD;  Location: [facility] OR;  Service: Orthopedics;  Laterality: Right;   TEE WITHOUT CARDIOVERSION N/A 08/27/2022    Procedure: TRANSESOPHAGEAL ECHOCARDIOGRAM;  Surgeon: Hobart Powell BRAVO, MD;  Location: [facility] INVASIVE CV LAB;  Service: Cardiovascular;  Laterality: N/A;   TRANSESOPHAGEAL ECHOCARDIOGRAM (CATH LAB) N/A 10/24/2023    Procedure: TRANSESOPHAGEAL ECHOCARDIOGRAM;  Surgeon: Michele Richardson, DO;  [facility];  Service: Cardiovascular;  Laterality: N/A;   WISDOM TOOTH EXTRACTION                   Family History  Problem Relation Age of Onset   Diabetes Other     CAD Other            Social History         Socioeconomic History   Marital status: Divorced      Spouse name: Not on file   Number of children: 0   Years of education: Not on file   Highest education level: Not on file  Occupational History   Not on file  Tobacco Use   Smoking status: Former      Current packs/day: 0.00      Types: Cigarettes      Quit date: 01/2017      Years since quitting: 7.1   Smokeless tobacco: Never  Vaping Use   Vaping status: Never Used  Substance and Sexual Activity   Alcohol use: Not Currently   Drug use: Yes      Types: Marijuana      Comment: on ocassion- Last time - 11/13/22   Sexual activity: Yes      Birth control/protection: Post-menopausal  Other Topics Concern   Not on file  Social History Narrative   Not on file    Social Drivers of Health        Financial Resource Strain: Low Risk (10/19/2023)    Received from [facility]    Overall Financial Resource Strain (CARDIA)     How hard is it for you to pay for the very basics like food, housing, medical care, and heating?: Not hard at all  Food Insecurity: No Food Insecurity (01/21/2024)    Hunger Vital Sign     Worried About Running Out of Food in the Last Year: Never true     Ran Out of Food in the Last Year: Never true  Transportation Needs: No  Transportation Needs (01/21/2024)    PRAPARE - Therapist, Art (Medical): No     Lack of Transportation (Non-Medical): No  Physical Activity: Not on  file  Stress: Not on file  Social Connections: Unknown (12/15/2023)    Social Connection and Isolation Panel     Frequency of Communication with Friends and Family: Three times a week     Frequency of Social Gatherings with Friends and Family: Three times a week     Attends Religious Services: Not on file     Active Member of Clubs or Organizations: Not on file     Attends Club or Organization Meetings: Not on file     Marital Status: Not on file  Intimate Partner Violence: Not At Risk (01/21/2024)    Humiliation, Afraid, Rape, and Kick questionnaire     Fear of Current or Ex-Partner: No     Emotionally Abused: No     Physically Abused: No     Sexually Abused: No             Prior to Admission medications   Medication Sig Start Date End Date Taking? Authorizing Provider  aspirin  EC 81 MG tablet Take 1 tablet (81 mg total) by mouth daily. 01/03/24   Yes Milford, Harlene HERO, FNP  atorvastatin  (LIPITOR) 40 MG tablet Take 40 mg by mouth daily. 12/14/19   Yes [provider]  clopidogrel  (PLAVIX ) 75 MG tablet Take 1 tablet (75 mg total) by mouth daily. 01/03/24   Yes Milford, Harlene HERO, FNP  diphenhydrAMINE  HCl, Sleep, (ZZZQUIL) 25 MG CAPS Take 25 mg by mouth at bedtime.     Yes [provider]  ferrous sulfate  325 (65 FE) MG EC tablet Take 1 tablet (325 mg total) by mouth in the morning and at bedtime. 12/18/23   Yes Bryn Bernardino NOVAK, MD  Furosemide  (FUROSCIX ) 80 MG/10ML CTKT Inject 80 mg into the skin daily as needed. ONLY AS DIRECT BY THE [facility] 03/17/24   Yes [place], [place], [place]  gabapentin  (NEURONTIN ) 300 MG capsule Take 300 mg by mouth at bedtime.     Yes [provider]  insulin  degludec (TRESIBA  FLEXTOUCH) 100 UNIT/ML FlexTouch Pen Inject 6 Units into the skin at  bedtime. 11/27/23   Yes Arrien, Mauricio Daniel, MD  levothyroxine  (SYNTHROID ) 150 MCG tablet Take 150 mcg by mouth daily before breakfast. 12/02/19   Yes [provider]  metolazone  (ZAROXOLYN ) 2.5 MG tablet Take 1 tablet (2.5 mg total) by mouth once a week. Every Tuesday. Take an extra 40 mEq of Potassium with Metolazone  Patient taking differently: Take 2.5 mg by mouth once a week. Every Sunday. Take an extra 40 mEq of Potassium with Metolazone  02/25/24 05/25/24 Yes Milford, Harlene HERO, FNP  mexiletine (MEXITIL ) 150 MG capsule Take 1 capsule (150 mg total) by mouth 2 (two) times daily. 02/04/24   Yes Colletta Manuelita Garre, PA-C  midodrine  (PROAMATINE ) 2.5 MG tablet Take 1 tablet (2.5 mg total) by mouth 2 (two) times daily with a meal. 02/04/24   Yes Colletta Manuelita Garre, PA-C  nitroGLYCERIN  (NITROSTAT ) 0.4 MG SL tablet Place 1 tablet (0.4 mg total) under the tongue every 5 (five) minutes x 3 doses as needed for chest pain (if no relief after 3rd dose proceed to ED or call 911). 11/06/2022-New 12/09/23   Yes Miriam Norris, NP  OXYGEN  Inhale 2 L into the lungs continuous.     Yes [provider]  pantoprazole  (PROTONIX ) 40 MG tablet Take 40 mg by mouth daily. 12/21/19   Yes [provider]  polyethylene glycol (MIRALAX  / GLYCOLAX ) 17 g packet Take 17 g by mouth 2 (two)  times daily. 10/29/23   Yes Hongalgi, Anand D, MD  potassium chloride  (KLOR-CON  M) 20 MEQ tablet Take 60 meq in am and 40 meq in pm Patient taking differently: 40 mEq 2 (two) times daily. 02/27/24   Yes Milford, Harlene HERO, FNP  promethazine  (PHENERGAN ) 25 MG tablet Take 1 tablet (25 mg total) by mouth every 6 (six) hours as needed for nausea or vomiting. 10/15/23   Yes Francesca Elsie CROME, MD  torsemide  (DEMADEX ) 100 MG tablet Take 1 tablet (100 mg total) by mouth 2 (two) times daily. 01/26/24   Yes Fairy Frames, MD            Current Outpatient Medications  Medication Sig Dispense Refill   aspirin  EC 81 MG  tablet Take 1 tablet (81 mg total) by mouth daily. 30 tablet 3   atorvastatin  (LIPITOR) 40 MG tablet Take 40 mg by mouth daily.       clopidogrel  (PLAVIX ) 75 MG tablet Take 1 tablet (75 mg total) by mouth daily. 90 tablet 3   diphenhydrAMINE  HCl, Sleep, (ZZZQUIL) 25 MG CAPS Take 25 mg by mouth at bedtime.       ferrous sulfate  325 (65 FE) MG EC tablet Take 1 tablet (325 mg total) by mouth in the morning and at bedtime. 60 tablet 0   Furosemide  (FUROSCIX ) 80 MG/10ML CTKT Inject 80 mg into the skin daily as needed. ONLY AS DIRECT BY THE [facility] 2 each 1   gabapentin  (NEURONTIN ) 300 MG capsule Take 300 mg by mouth at bedtime.       insulin  degludec (TRESIBA  FLEXTOUCH) 100 UNIT/ML FlexTouch Pen Inject 6 Units into the skin at bedtime.       levothyroxine  (SYNTHROID ) 150 MCG tablet Take 150 mcg by mouth daily before breakfast.       metolazone  (ZAROXOLYN ) 2.5 MG tablet Take 1 tablet (2.5 mg total) by mouth once a week. Every Tuesday. Take an extra 40 mEq of Potassium with Metolazone  (Patient taking differently: Take 2.5 mg by mouth once a week. Every Sunday. Take an extra 40 mEq of Potassium with Metolazone ) 20 tablet 3   mexiletine (MEXITIL ) 150 MG capsule Take 1 capsule (150 mg total) by mouth 2 (two) times daily. 60 capsule 3   midodrine  (PROAMATINE ) 2.5 MG tablet Take 1 tablet (2.5 mg total) by mouth 2 (two) times daily with a meal. 60 tablet 3   nitroGLYCERIN  (NITROSTAT ) 0.4 MG SL tablet Place 1 tablet (0.4 mg total) under the tongue every 5 (five) minutes x 3 doses as needed for chest pain (if no relief after 3rd dose proceed to ED or call 911). 11/06/2022-New 25 tablet 3   OXYGEN  Inhale 2 L into the lungs continuous.       pantoprazole  (PROTONIX ) 40 MG tablet Take 40 mg by mouth daily.       polyethylene glycol (MIRALAX  / GLYCOLAX ) 17 g packet Take 17 g by mouth 2 (two) times daily.       potassium chloride  (KLOR-CON  M) 20 MEQ tablet Take 60 meq in am and 40 meq in pm (Patient  taking differently: 40 mEq 2 (two) times daily.) 150 tablet 11   promethazine  (PHENERGAN ) 25 MG tablet Take 1 tablet (25 mg total) by mouth every 6 (six) hours as needed for nausea or vomiting. 20 tablet 0   torsemide  (DEMADEX ) 100 MG tablet Take 1 tablet (100 mg total) by mouth 2 (two) times daily. 60 tablet 1      No current  facility-administered medications for this visit.        Allergies      Allergies  Allergen Reactions   Trental  [Pentoxifylline ] Nausea And Vomiting   Vibramycin [Doxycycline] Nausea And Vomiting            Review of Systems:               General:                      normal appetite, + decreased energy, no weight gain, no weight loss, no fever             Cardiac:                       no chest pain with exertion, no chest pain at rest, +SOB with moderate exertion, no resting SOB, no PND, + orthopnea, no palpitations, no arrhythmia, no atrial fibrillation, + LE edema, no dizzy spells, no syncope             Respiratory:                 +  shortness of breath, + home oxygen , no productive cough, no dry cough, no bronchitis, no wheezing, no hemoptysis, no asthma, no pain with inspiration or cough, no sleep apnea, no CPAP at night             GI:                               no difficulty swallowing, no reflux, no frequent heartburn, no hiatal hernia, no abdominal pain, no constipation, no diarrhea, no hematochezia, no hematemesis, no melena             GU:                              no dysuria,  no frequency, no urinary tract infection, no hematuria, no kidney stones, + chronic kidney disease             Vascular:                     no pain suggestive of claudication, no pain in feet, no leg cramps, no varicose veins, no DVT, no non-healing foot ulcer             Neuro:                         no stroke, no TIA's, no seizures, no headaches, no temporary blindness one eye,  no slurred speech, no peripheral neuropathy, no chronic pain, + instability of gait, no  memory/cognitive dysfunction             Musculoskeletal:         no arthritis, no joint swelling, no myalgias, + difficulty walking, + reduced mobility              Skin:                            no rash, no itching, no skin infections, no pressure sores or ulcerations             Psych:  no anxiety, no depression, no nervousness, no unusual recent stress             Eyes:                           no blurry vision, no floaters, no recent vision changes, no glasses or contacts             ENT:                            no hearing loss, no loose or painful teeth, no dentures, no issues             Hematologic:               + easy bruising, no abnormal bleeding, no clotting disorder, no frequent epistaxis             Endocrine:                   + diabetes, does check CBG's at home                            Physical Exam:               BP 119/74   Pulse 87   Resp 20   Ht 5' 4 (1.626 m)   Wt 189 lb (85.7 kg)   SpO2 98% Comment: RA  BMI 32.44 kg/m              General:                      well-appearing             HEENT:                       Unremarkable, NCAT, PERLA, EOMI             Neck:                           no JVD, no bruits, no adenopathy              Chest:                          clear to auscultation, symmetrical breath sounds, no wheezes, no rhonchi              CV:                              RRR, 3/6 systolic murmur RSB, no diastolic murmur             Abdomen:                    soft, non-tender, no masses              Extremities:                 warm, well-perfused, pedal pulses not palpable, no lower extremity edema, left BKA stump wrapped.             Rectal/GU                   Deferred  Neuro:                         Grossly non-focal and symmetrical throughout             Skin:                            Clean and dry, no rashes, no breakdown   Diagnostic Tests:   ECHOCARDIOGRAM LIMITED REPORT       Patient Name:    Kayla Schmitt Date of Exam: 11/20/2023  Medical Rec #:  [id number]              Height:       64.0 in  Accession #:    [id number]             Weight:       195.1 lb  Date of Birth:  1965/11/06               BSA:          1.936 m  Patient Age:    57 years               BP:           102/66 mmHg  Patient Gender: F                      HR:           72 bpm.  Exam Location:  Inpatient   Procedure: Limited Echo, Limited Color Doppler and Intracardiac  Opacification            Agent (Both Spectral and Color Flow Doppler were utilized  during            procedure).   Indications:    CHF I50.9    History:        Patient has prior history of Echocardiogram examinations,  most                 recent 11/15/2023. CHF, Previous Myocardial Infarction and  CAD,                 Signs/Symptoms:Chest Pain; Risk Factors:Hypertension,  Diabetes                 and Dyslipidemia.    Sonographer:    Thea Norlander RCS  Sonographer#2:  Damien Senior RDCS  Referring Phys: [id number] JORDAN LEE   IMPRESSIONS     1. Left ventricular ejection fraction, by estimation, is 25 to 30%. The  left ventricle has severely decreased function. The left ventricle  demonstrates global hypokinesis. The left ventricular internal cavity size  was mildly dilated. Left ventricular  diastolic parameters are consistent with Grade II diastolic dysfunction  (pseudonormalization). Elevated left atrial pressure.   2. The mitral valve is abnormal. Mild to moderate mitral valve  regurgitation. Moderate mitral annular calcification.   3. There is moderate calcification of the aortic valve. There is moderate  thickening of the aortic valve. Moderate to severe aortic valve stenosis.  Aortic valve Vmax measures 3.21 m/s.   4. The inferior vena cava is dilated in size with <50% respiratory  variability, suggesting right atrial pressure of 15 mmHg.   FINDINGS   Left Ventricle: Left ventricular ejection fraction, by estimation,  is 25  to 30%. The left ventricle has severely decreased function. The left  ventricle demonstrates global hypokinesis.  Definity  contrast agent was  given IV to delineate the left  ventricular endocardial borders. The left ventricular internal cavity size  was mildly dilated. Left ventricular diastolic parameters are consistent  with Grade II diastolic dysfunction (pseudonormalization). Elevated left  atrial pressure.   Mitral Valve: The mitral valve is abnormal. Moderate mitral annular  calcification. Mild to moderate mitral valve regurgitation.   Tricuspid Valve: The tricuspid valve is normal in structure. Tricuspid  valve regurgitation is trivial.   Aortic Valve: Suspect primarily moderate AS, Vmax 3.47m/s with DVI 0.26.  There is moderate calcification of the aortic valve. There is moderate  thickening of the aortic valve. Moderate to severe aortic stenosis is  present. Aortic valve mean gradient  measures 22.0 mmHg. Aortic valve peak gradient measures 41.2 mmHg. Aortic  valve area, by VTI measures 0.82 cm.   Pulmonic Valve: The pulmonic valve was normal in structure. Pulmonic valve  regurgitation is trivial.   Venous: The inferior vena cava is dilated in size with less than 50%  respiratory variability, suggesting right atrial pressure of 15 mmHg.   LEFT VENTRICLE  PLAX 2D  LVIDd:         5.60 cm   Diastology  LVIDs:         5.00 cm   LV e' medial:    6.64 cm/s  LV PW:         0.90 cm   LV E/e' medial:  21.4  LV IVS:        0.80 cm   LV e' lateral:   8.05 cm/s  LVOT diam:     1.90 cm   LV E/e' lateral: 17.6  LV SV:         39  LV SV Index:   20  LVOT Area:     2.84 cm     RIGHT VENTRICLE         IVC  TAPSE (M-mode): 2.0 cm  IVC diam: 2.70 cm   LEFT ATRIUM         Index  LA diam:    4.10 cm 2.12 cm/m   AORTIC VALVE  AV Area (Vmax):    0.74 cm  AV Area (Vmean):   0.96 cm  AV Area (VTI):     0.82 cm  AV Vmax:           321.00 cm/s  AV Vmean:          185.500  cm/s  AV VTI:            0.480 m  AV Peak Grad:      41.2 mmHg  AV Mean Grad:      22.0 mmHg  LVOT Vmax:         83.60 cm/s  LVOT Vmean:        63.000 cm/s  LVOT VTI:          0.138 m  LVOT/AV VTI ratio: 0.29    AORTA  Ao Root diam: 2.90 cm   MITRAL VALVE  MV Area (PHT): 4.49 cm     SHUNTS  MV Decel Time: 169 msec     Systemic VTI:  0.14 m  MV E velocity: 142.00 cm/s  Systemic Diam: 1.90 cm  MV A velocity: 84.20 cm/s  MV E/A ratio:  1.69   Morene Brownie  Electronically signed by Morene Brownie  Signature Date/Time: 11/20/2023/5:15:20 PM        Final      Procedures   RIGHT HEART  CATH AND CORONARY ANGIOGRAPHY    Conclusion       Mid LM to Dist LM lesion is 40% stenosed.   Ost LAD to Prox LAD lesion is 75% stenosed.   Mid LAD lesion is 70% stenosed.   Ost Cx to Prox Cx lesion is 100% stenosed.   Mid RCA lesion is 70% stenosed.   1st Diag lesion is 25% stenosed.   1.  Multivessel disease relatively unchanged versus prior study with occluded left circumflex, high-grade mid LAD, and moderate right coronary artery disease.  Given the lack of exertional angina, medical therapy will be pursued. 2.  Fick cardiac output of 6.7 L/min and Fick cardiac index of 3.6 L/min/m with the following hemodynamics:            Right atrial pressure mean of 8 mmHg            RV 58/6 with an end-diastolic pressure of 12 mmHg            PA 53/29 with a mean of 40 mmHg            Wedge pressure mean of 29 mmHg with V waves to 44 mmHg            PVR of 1.6 Woods units            PA pulsatility index of 3 3.  Left femoral access, which is the side of the patient's BKA, looks to be approachable if TAVR is to be pursued.   Recommendation: Judicious IV fluids given CKD, continue evaluation for aortic valve intervention.  Discharge later today.   Procedural Details   Technical Details The patient is a 58 year old female with a history of, hyper, peripheral vascular disease status post  bilateral SFA stents, bilateral transmetatarsal amputation, left BKA, CKD stage IIIa, recent GI bleed, and severe symptomatic aortic stenosis who was evaluated in the outpatient setting.  She is referred for coronary angiography and right heart catheterization as part of her evaluation for potential aortic valve intervention.  After obtaining consent, the patient brought to the cardiac catheterization laboratory and prepped in draped sterile fashion.  Xylocaine  was used to anesthetize the right wrist and a 6 French Terumo glide sheath was placed.  5000 units heparin  and 5 mg verapamil  were administered through the sheath.  Previously placed antecubital IV could not be exchanged for a wire.  Therefore ultrasound and micropuncture were used to gain access to the right internal jugular vein.  A 5 French sheath was placed.  Right heart catheterization performed with a 5 French balloontipped catheter.  A 6 French TIG catheter was used for selective coronary angiography.  A 5 French pigtail catheter was maneuvered to the distal abdominal aorta and aortography bilateral iliofemoral angiography was performed.  After review of the angiographic and hemodynamic data, no further interventions were pursued.  A TR band was placed.  There were no acute complications. Estimated blood loss <50 mL.   During this procedure medications were administered to achieve and maintain moderate conscious sedation while the patient's heart rate, blood pressure, and oxygen  saturation were continuously monitored and I was present face-to-face 100% of this time. Suzen Salinas Haren Cardiovascular Specialist and Shasta Bevely Gosling Cardiovascular Specialist are independent, trained observers who assisted in the monitoring of the patient's level of consciousness.    Medications (Filter: Administrations occurring from 1051 to 1152 on 01/17/24)  important  Continuous medications are totaled by the amount administered until 01/17/24 1152.  Heparin  (Porcine) in NaCl 1000-0.9 UT/500ML-% SOLN (mL)  Total volume: 1,000 mL Date/Time Rate/Dose/Volume Action    01/17/24 1113 500 mL Given    1113 500 mL Given    midazolam  (VERSED ) injection (mg)  Total dose: 1 mg Date/Time Rate/Dose/Volume Action    01/17/24 1113 1 mg Given    fentaNYL  (SUBLIMAZE ) injection (mcg)  Total dose: 25 mcg Date/Time Rate/Dose/Volume Action    01/17/24 1113 25 mcg Given    lidocaine  (PF) (XYLOCAINE ) 1 % injection (mL)  Total volume: 9 mL Date/Time Rate/Dose/Volume Action    01/17/24 1117 2 mL Given    1120 2 mL Given    1123 5 mL Given    Radial Cocktail/Verapamil  only (mL)  Total volume: 10 mL Date/Time Rate/Dose/Volume Action    01/17/24 1121 10 mL Given    heparin  sodium (porcine) injection (Units)  Total dose: 5,000 Units Date/Time Rate/Dose/Volume Action    01/17/24 1121 5,000 Units Given    iohexol  (OMNIPAQUE ) 350 MG/ML injection (mL)  Total volume: 60 mL Date/Time Rate/Dose/Volume Action    01/17/24 1149 60 mL Given      Sedation Time   Sedation Time Physician-1: 28 minutes 23 seconds Contrast        Administrations occurring from 1051 to 1152 on 01/17/24:  Medication Name Total Dose  iohexol  (OMNIPAQUE ) 350 MG/ML injection 60 mL    Radiation/Fluoro   Fluoro time: 4.6 (min) DAP: 16.4 (Gycm2) Cumulative Air Kerma: 191.2 (mGy) Complications   Complications documented before study signed (01/17/2024 11:56 AM)    No complications were associated with this study.  Documented by Bevely Gosling, Shasta Terry Saunas - 01/17/2024 11:52 AM      Coronary Findings   Diagnostic Dominance: Right Left Main  Mid LM to Dist LM lesion is 40% stenosed.    Left Anterior Descending  Ost LAD to Prox LAD lesion is 75% stenosed. The lesion is calcified.  Mid LAD lesion is 70% stenosed. The lesion is calcified.    First Diagonal Branch  1st Diag lesion is 25% stenosed.    Left Circumflex  Ost Cx to Prox Cx lesion is 100% stenosed.     Third Obtuse Marginal Branch  Collaterals  3rd Mrg filled by collaterals from 1st Diag.       Right Coronary Artery  There is mild diffuse disease throughout the vessel.  Mid RCA lesion is 70% stenosed. The lesion is calcified.    Intervention    No interventions have been documented.    Coronary Diagrams   Diagnostic Dominance: Right  Intervention    Implants    No implant documentation for this case.    Syngo Images    Show images for CARDIAC CATHETERIZATION Images on Long Term Storage    Show images for Kasumi, Ditullio to Procedure Log   Procedure Log    Hemo Data   Flowsheet Row Most Recent Value  Fick Cardiac Output 6.86 L/min  Fick Cardiac Output Index 3.58 (L/min)/BSA  RA A Wave 12 mmHg  RA V Wave 10 mmHg  RA Mean 8 mmHg  RV Systolic Pressure 58 mmHg  RV Diastolic Pressure 6 mmHg  RV EDP 12 mmHg  PA Systolic Pressure 53 mmHg  PA Diastolic Pressure 29 mmHg  PA Mean 40 mmHg  PW A Wave 25 mmHg  PW V Wave 44 mmHg  PW Mean 29 mmHg  AO Systolic Pressure 101 mmHg  AO Diastolic Pressure 61 mmHg  AO Mean 76 mmHg  QP/QS  0.78  TPVR Index 14.4 HRUI  TSVR Index 21.2 HRUI  PVR SVR Ratio 0.21  TPVR/TSVR Ratio 0.68      Narrative & Impression  CLINICAL DATA:  Severe Aortic Stenosis.   EXAM: Cardiac TAVR CT   TECHNIQUE: A non-contrast, gated CT scan was obtained with axial slices of 2.5 mm through the heart for aortic valve scoring. A 120 kV retrospective, gated, contrast cardiac scan was obtained. Gantry rotation speed was 230 msec and collimation was 0.63 mm. Nitroglycerin  was not given. The 3D dataset was reconstructed in systole with motion correction. The 3D data set was reconstructed in 5% intervals of the 0-95% of the R-R cycle. Systolic and diastolic phases were analyzed on a dedicated workstation using MPR, MIP, and VRT modes. The patient received 100 cc of contrast.   FINDINGS: Image quality: Excellent.   Noise artifact  is: Limited.   Valve Morphology: Tricuspid aortic valve with diffuse severe calcifications.   Aortic Valve Calcium  score: 2646   Aortic annular dimension:   Phase assessed: 24%   Annular area: 467 mm2   Annular perimeter: 79.2 mm   Max diameter: 28.4 mm   Min diameter: 20.5 mm   Annular and subannular calcification: None.   Membranous septum length: 10.3 mm   Optimal coplanar projection: LAO 11 CAU 5   Coronary Artery Height above Annulus:   Left Main: 12.6 mm   Right Coronary: 14.9 mm   Sinus of Valsalva Measurements:   Non-coronary: 32 mm   Right-coronary: 30 mm   Left-coronary: 31 mm   Sinus of Valsalva Height:   Non-coronary: 22.4 mm   Right-coronary: 21.8 mm   Left-coronary: 20.1 mm   Sinotubular Junction: 29 mm   Ascending Thoracic Aorta: 33 mm   Coronary Arteries: Normal coronary origin. Right dominance. The study was performed without use of NTG and is insufficient for plaque evaluation. Please refer to recent cardiac catheterization for coronary assessment.   Cardiac Morphology:   Right Atrium: Right atrial size is dilated.   Right Ventricle: The right ventricular cavity is within normal limits.   Left Atrium: Left atrial size is dilated with no left atrial appendage filling defect.   Left Ventricle: The ventricular cavity size is dilated.   Pulmonary arteries: Dilated pulmonary artery suggestive of pulmonary hypertension.   Pulmonary veins: Normal pulmonary venous drainage.   Pericardium: Normal thickness with no significant effusion or calcium  present.   Mitral Valve: The mitral valve is degenerative with mild to moderate calcium .   Extra-cardiac findings: See attached radiology report for non-cardiac structures.   IMPRESSION: 1. Tricuspid aortic valve with severe aortic stenosis.   2. Annular measurements support a 26 mm S3 or 29 mm Evolut Pro.   3. No significant annular or subannular calcifications.   4. Sufficient  coronary to annulus distance.   5. Optimal Fluoroscopic Angle for Delivery: LAO 11 CAU 5   6. Dilated pulmonary artery suggestive of pulmonary hypertension.   Darryle T. Barbaraann, MD   Electronically Signed: By: Darryle Barbaraann M.D. On: 02/23/2024 15:09      Narrative & Impression  CLINICAL DATA:  Aortic valve replacement, preoperative evaluation   EXAM: CTA ABDOMEN AND PELVIS WITHOUT AND WITH CONTRAST   TECHNIQUE: Multidetector CT imaging of the abdomen and pelvis was performed using the standard protocol during bolus administration of intravenous contrast. Multiplanar reconstructed images and MIPs were obtained and reviewed to evaluate the vascular anatomy.   RADIATION DOSE REDUCTION: This exam was performed according to the  departmental dose-optimization program which includes automated exposure control, adjustment of the mA and/or kV according to patient size and/or use of iterative reconstruction technique.   CONTRAST:  OMNIPAQUE  IOHEXOL  350 MG/ML SOLN   COMPARISON:  CT renal stone protocol performed December 25, 2023   FINDINGS: VASCULAR   Aorta: Mild diffuse atherosclerotic changes. Minimal diameter in the infrarenal segment is estimated at 6 mm.   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   SMA: Occluded in the proximal segment with estimated length of the occlusion measuring approximately 2.5 cm with underlying dense atherosclerotic plaque.   Renals: Single right renal artery with moderate stenosis in the proximal segment. Single left renal artery with moderate stenosis in the proximal segment.   IMA: Mild to moderate disease is present in the proximal segment, patent.   Inflow: The right common iliac artery is patent with underlying moderate atherosclerotic plaque. Minimal diameter is estimated at 2.8 mm. Severe disease is present in the proximal right internal iliac artery. The right external iliac artery  is patent with underlying moderate atherosclerotic plaque and minimal diameter estimated at 3.5 mm.   Moderate plaque is present in the left common iliac artery with minimal diameter estimated at 2.8 mm. Severe disease is present in the left internal iliac artery. Moderate to severe plaque is present in the left external iliac artery with minimal diameter estimated at 2.1 mm.   Proximal Outflow: There is dense calcified plaque in the right common femoral artery with minimal diameter estimated at 2 mm. There is dense calcified plaque in the left common femoral artery with minimal diameter estimated at 3 mm.   Stent material is partially captured in the left SFA.   Veins: No obvious venous abnormality within the limitations of this arterial phase study.   Review of the MIP images confirms the above findings.   NON-VASCULAR   Lower chest: Reported separately.   Hepatobiliary: Nodular liver surface margin. Gallbladder is contracted.   Pancreas: Unremarkable. No pancreatic ductal dilatation or surrounding inflammatory changes.   Spleen: Spleen is enlarged measuring 14.2 cm.   Adrenals/Urinary Tract: Mild diffuse thickening of the adrenal glands. No hydronephrosis. Urinary bladder is grossly unremarkable.   Stomach/Bowel: No dilated loops of bowel are appreciated.   Lymphatic: No significant lymphadenopathy.   Reproductive: Uterus and bilateral adnexa are unremarkable.   Other: Nothing significant.   Musculoskeletal: Degenerative changes in the lumbar spine.   IMPRESSION: 1. 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 2. Moderate to severe inflow and access vessel disease. The minimal diameter in the right iliac system is estimated at 2.8 mm. Severe right common femoral disease is present. The minimal diameter in the left iliac system is estimated at 2.1 mm with moderate disease present in  the left common femoral artery.     Electronically Signed   By: Maude Naegeli M.D.   On: 02/21/2024 07:39        Impression:   This 58 year old woman has stage D, severe, symptomatic, low-flow/low gradient aortic stenosis with NYHA class II symptoms of exertional fatigue and shortness of breath consistent with chronic diastolic congestive heart failure.  She had an admission in July 2025 for acute heart failure and echocardiogram at that time showed an ejection fraction of 25%.  I have personally reviewed her 2D echocardiogram, cardiac catheterization, and CTA studies.  Her most recent complete echo was on 11/20/2023 and showed  a severely calcified and thickened aortic valve with restricted leaflet mobility.  The mean gradient was 22 mmHg with a valve area of 0.82 cm with a very low stroke-volume index of 20.  Cardiac catheterization showed stable multivessel coronary disease with moderate pulmonary hypertension.  She has no anginal symptoms and this has been treated medically.  She has relatively small diffusely diseased vessels.  I agree that aortic valve replacement is indicated in this patient for relief of her symptoms and to prevent progressive left ventricular dysfunction and recurrent acute heart failure.  Given her low EF and comorbidities I do not think she is a candidate for open surgical aortic valve replacement.  I think transcatheter aortic valve replacement would be a reasonable alternative for treating her.  Her gated cardiac CTA shows anatomy suitable for TAVR using a 26 mm SAPIEN 3 valve.  She has severe aortoiliac and femoral calcific atherosclerosis that would probably preclude a safe transfemoral approach.  I think alternative access via the right common carotid artery or left subclavian artery would be possible.   The patient was counseled at length regarding treatment alternatives for management of severe symptomatic aortic stenosis. The risks and benefits of surgical intervention  has been discussed in detail. Long-term prognosis with medical therapy was discussed. Alternative approaches such as conventional surgical aortic valve replacement, transcatheter aortic valve replacement, and palliative medical therapy were compared and contrasted at length. This discussion was placed in the context of the patient's own specific clinical presentation and past medical history. All of her questions have been addressed.    Following the decision to proceed with transcatheter aortic valve replacement, a discussion was held regarding what types of management strategies would be attempted intraoperatively in the event of life-threatening complications, including whether or not the patient would be considered a candidate for the use of cardiopulmonary bypass and/or conversion to open sternotomy for attempted surgical intervention.  Given her degree of vascular disease and comorbidities I do not think she is a candidate for emergent sternotomy to manage any intraoperative complications.  The patient has been advised of a variety of complications that might develop including but not limited to risks of death, stroke, paravalvular leak, aortic dissection or other major vascular complications, aortic annulus rupture, device embolization, cardiac rupture or perforation, mitral regurgitation, acute myocardial infarction, arrhythmia, heart block or bradycardia requiring permanent pacemaker placement, congestive heart failure, respiratory failure, renal failure, pneumonia, infection, other late complications related to structural valve deterioration or migration, or other complications that might ultimately cause a temporary or permanent loss of functional independence or other long term morbidity. The patient provides full informed consent for the procedure as described and all questions were answered.       Plan:   TAVR using a SAPIEN 3 valve with left subclavian artery access.        Dorise LOIS Fellers, MD

## 2024-04-07 ENCOUNTER — Inpatient Hospital Stay (HOSPITAL_COMMUNITY)
Admission: RE | Admit: 2024-04-07 | Discharge: 2024-04-08 | DRG: 266 | Disposition: A | Discharge status: Home / Self Care | Attending: Surgery | Admitting: Surgery

## 2024-04-07 ENCOUNTER — Inpatient Hospital Stay (HOSPITAL_COMMUNITY): Payer: Self-pay | Admitting: Certified Registered"

## 2024-04-07 ENCOUNTER — Encounter (HOSPITAL_COMMUNITY): Payer: Self-pay | Admitting: Internal Medicine

## 2024-04-07 ENCOUNTER — Other Ambulatory Visit: Payer: Self-pay

## 2024-04-07 ENCOUNTER — Encounter (HOSPITAL_COMMUNITY): Admission: RE | Disposition: A | Payer: Self-pay | Attending: Surgery

## 2024-04-07 ENCOUNTER — Inpatient Hospital Stay (HOSPITAL_COMMUNITY)

## 2024-04-07 DIAGNOSIS — I5023 Acute on chronic systolic (congestive) heart failure: Secondary | ICD-10-CM | POA: Diagnosis present

## 2024-04-07 DIAGNOSIS — Z7982 Long term (current) use of aspirin: Secondary | ICD-10-CM

## 2024-04-07 DIAGNOSIS — Z89512 Acquired absence of left leg below knee: Secondary | ICD-10-CM | POA: Diagnosis not present

## 2024-04-07 DIAGNOSIS — Z952 Presence of prosthetic heart valve: Principal | ICD-10-CM

## 2024-04-07 DIAGNOSIS — I252 Old myocardial infarction: Secondary | ICD-10-CM

## 2024-04-07 DIAGNOSIS — Z951 Presence of aortocoronary bypass graft: Secondary | ICD-10-CM

## 2024-04-07 DIAGNOSIS — I5033 Acute on chronic diastolic (congestive) heart failure: Secondary | ICD-10-CM | POA: Diagnosis not present

## 2024-04-07 DIAGNOSIS — N1832 Chronic kidney disease, stage 3b: Secondary | ICD-10-CM | POA: Diagnosis present

## 2024-04-07 DIAGNOSIS — D631 Anemia in chronic kidney disease: Secondary | ICD-10-CM | POA: Diagnosis present

## 2024-04-07 DIAGNOSIS — E1122 Type 2 diabetes mellitus with diabetic chronic kidney disease: Secondary | ICD-10-CM | POA: Diagnosis present

## 2024-04-07 DIAGNOSIS — Z89411 Acquired absence of right great toe: Secondary | ICD-10-CM

## 2024-04-07 DIAGNOSIS — I70223 Atherosclerosis of native arteries of extremities with rest pain, bilateral legs: Secondary | ICD-10-CM | POA: Diagnosis present

## 2024-04-07 DIAGNOSIS — R0902 Hypoxemia: Secondary | ICD-10-CM | POA: Diagnosis not present

## 2024-04-07 DIAGNOSIS — I130 Hypertensive heart and chronic kidney disease with heart failure and stage 1 through stage 4 chronic kidney disease, or unspecified chronic kidney disease: Secondary | ICD-10-CM | POA: Diagnosis present

## 2024-04-07 DIAGNOSIS — Z006 Encounter for examination for normal comparison and control in clinical research program: Secondary | ICD-10-CM | POA: Diagnosis not present

## 2024-04-07 DIAGNOSIS — M726 Necrotizing fasciitis: Secondary | ICD-10-CM | POA: Diagnosis present

## 2024-04-07 DIAGNOSIS — D649 Anemia, unspecified: Secondary | ICD-10-CM | POA: Diagnosis present

## 2024-04-07 DIAGNOSIS — Z833 Family history of diabetes mellitus: Secondary | ICD-10-CM

## 2024-04-07 DIAGNOSIS — K219 Gastro-esophageal reflux disease without esophagitis: Secondary | ICD-10-CM | POA: Diagnosis present

## 2024-04-07 DIAGNOSIS — Z794 Long term (current) use of insulin: Secondary | ICD-10-CM | POA: Diagnosis not present

## 2024-04-07 DIAGNOSIS — Z6834 Body mass index (BMI) 34.0-34.9, adult: Secondary | ICD-10-CM

## 2024-04-07 DIAGNOSIS — I10 Essential (primary) hypertension: Secondary | ICD-10-CM | POA: Diagnosis present

## 2024-04-07 DIAGNOSIS — Z9981 Dependence on supplemental oxygen: Secondary | ICD-10-CM | POA: Diagnosis not present

## 2024-04-07 DIAGNOSIS — I110 Hypertensive heart disease with heart failure: Secondary | ICD-10-CM | POA: Diagnosis not present

## 2024-04-07 DIAGNOSIS — Z8719 Personal history of other diseases of the digestive system: Secondary | ICD-10-CM

## 2024-04-07 DIAGNOSIS — Z993 Dependence on wheelchair: Secondary | ICD-10-CM

## 2024-04-07 DIAGNOSIS — E118 Type 2 diabetes mellitus with unspecified complications: Secondary | ICD-10-CM | POA: Diagnosis present

## 2024-04-07 DIAGNOSIS — S88112S Complete traumatic amputation at level between knee and ankle, left lower leg, sequela: Secondary | ICD-10-CM

## 2024-04-07 DIAGNOSIS — Z89421 Acquired absence of other right toe(s): Secondary | ICD-10-CM

## 2024-04-07 DIAGNOSIS — I447 Left bundle-branch block, unspecified: Secondary | ICD-10-CM | POA: Diagnosis present

## 2024-04-07 DIAGNOSIS — Z7902 Long term (current) use of antithrombotics/antiplatelets: Secondary | ICD-10-CM

## 2024-04-07 DIAGNOSIS — E039 Hypothyroidism, unspecified: Secondary | ICD-10-CM | POA: Diagnosis present

## 2024-04-07 DIAGNOSIS — I739 Peripheral vascular disease, unspecified: Secondary | ICD-10-CM | POA: Diagnosis present

## 2024-04-07 DIAGNOSIS — I2510 Atherosclerotic heart disease of native coronary artery without angina pectoris: Secondary | ICD-10-CM | POA: Diagnosis present

## 2024-04-07 DIAGNOSIS — Z79890 Hormone replacement therapy (postmenopausal): Secondary | ICD-10-CM | POA: Diagnosis not present

## 2024-04-07 DIAGNOSIS — Z888 Allergy status to other drugs, medicaments and biological substances status: Secondary | ICD-10-CM

## 2024-04-07 DIAGNOSIS — E7800 Pure hypercholesterolemia, unspecified: Secondary | ICD-10-CM | POA: Diagnosis present

## 2024-04-07 DIAGNOSIS — E66811 Obesity, class 1: Secondary | ICD-10-CM | POA: Diagnosis present

## 2024-04-07 DIAGNOSIS — Z87891 Personal history of nicotine dependence: Secondary | ICD-10-CM

## 2024-04-07 DIAGNOSIS — Z8249 Family history of ischemic heart disease and other diseases of the circulatory system: Secondary | ICD-10-CM

## 2024-04-07 DIAGNOSIS — I350 Nonrheumatic aortic (valve) stenosis: Secondary | ICD-10-CM | POA: Diagnosis present

## 2024-04-07 DIAGNOSIS — E1151 Type 2 diabetes mellitus with diabetic peripheral angiopathy without gangrene: Secondary | ICD-10-CM | POA: Diagnosis present

## 2024-04-07 DIAGNOSIS — I255 Ischemic cardiomyopathy: Secondary | ICD-10-CM | POA: Diagnosis present

## 2024-04-07 DIAGNOSIS — Z01818 Encounter for other preprocedural examination: Secondary | ICD-10-CM

## 2024-04-07 DIAGNOSIS — E785 Hyperlipidemia, unspecified: Secondary | ICD-10-CM | POA: Diagnosis present

## 2024-04-07 DIAGNOSIS — Z881 Allergy status to other antibiotic agents status: Secondary | ICD-10-CM

## 2024-04-07 DIAGNOSIS — Z79899 Other long term (current) drug therapy: Secondary | ICD-10-CM

## 2024-04-07 HISTORY — DX: Nonrheumatic aortic (valve) stenosis: I35.0

## 2024-04-07 HISTORY — PX: TRANSESOPHAGEAL ECHOCARDIOGRAM (CATH LAB): EP1270

## 2024-04-07 HISTORY — PX: TRANSCATHETER AORTIC VALVE REPLACEMENT,SUBCLAVIAN: CATH118319

## 2024-04-07 LAB — ECHO TEE
AR max vel: 2.26 cm2
AV Area VTI: 1.9 cm2
AV Area mean vel: 2.18 cm2
AV Mean grad: 2 mmHg
AV Peak grad: 5.3 mmHg
Ao pk vel: 1.15 m/s
MV VTI: 0.34 cm2
Radius: 0.5 cm

## 2024-04-07 LAB — GLUCOSE, CAPILLARY
Glucose-Capillary: 194 mg/dL — ABNORMAL HIGH (ref 70–99)
Glucose-Capillary: 169 mg/dL — ABNORMAL HIGH (ref 70–99)
Glucose-Capillary: 159 mg/dL — ABNORMAL HIGH (ref 70–99)
Glucose-Capillary: 174 mg/dL — ABNORMAL HIGH (ref 70–99)
Glucose-Capillary: 229 mg/dL — ABNORMAL HIGH (ref 70–99)

## 2024-04-07 LAB — TYPE AND SCREEN
ABO/RH(D): O POS
Antibody Screen: NEGATIVE

## 2024-04-07 LAB — POCT I-STAT, CHEM 8
BUN: 53 mg/dL — ABNORMAL HIGH (ref 6–20)
Calcium, Ion: 1.2 mmol/L (ref 1.15–1.40)
Chloride: 105 mmol/L (ref 98–111)
Creatinine, Ser: 1.9 mg/dL — ABNORMAL HIGH (ref 0.44–1.00)
Glucose, Bld: 207 mg/dL — ABNORMAL HIGH (ref 70–99)
HCT: 27 % — ABNORMAL LOW (ref 36.0–46.0)
Hemoglobin: 9.2 g/dL — ABNORMAL LOW (ref 12.0–15.0)
Potassium: 3.8 mmol/L (ref 3.5–5.1)
Sodium: 140 mmol/L (ref 135–145)
TCO2: 24 mmol/L (ref 22–32)

## 2024-04-07 LAB — POCT ACTIVATED CLOTTING TIME
Activated Clotting Time: 133 s
Activated Clotting Time: 225 s

## 2024-04-07 SURGERY — TRANSCATHETER AORTIC VALVE REPLACEMENT,SUBCLAVIAN (CATHLAB)
Anesthesia: General

## 2024-04-07 MED ORDER — ACETAMINOPHEN 325 MG PO TABS
650.0000 mg | ORAL_TABLET | Freq: Four times a day (QID) | ORAL | Status: DC | PRN
  Administered 2024-04-07 – 2024-04-08 (×2): 650 mg via ORAL
  Filled 2024-04-07 (×2): qty 2

## 2024-04-07 MED ORDER — FENTANYL CITRATE (PF) 100 MCG/2ML IJ SOLN
INTRAMUSCULAR | Status: AC
  Filled 2024-04-07: qty 2

## 2024-04-07 MED ORDER — CHLORHEXIDINE GLUCONATE 0.12 % MT SOLN
15.0000 mL | Freq: Once | OROMUCOSAL | Status: AC
  Administered 2024-04-07: 15 mL via OROMUCOSAL
  Filled 2024-04-07: qty 15

## 2024-04-07 MED ORDER — ONDANSETRON HCL 4 MG/2ML IJ SOLN
INTRAMUSCULAR | Status: DC | PRN
  Administered 2024-04-07: 4 mg via INTRAVENOUS

## 2024-04-07 MED ORDER — LORATADINE 10 MG PO TABS
10.0000 mg | ORAL_TABLET | Freq: Every day | ORAL | Status: DC
  Administered 2024-04-07 – 2024-04-08 (×2): 10 mg via ORAL
  Filled 2024-04-07 (×2): qty 1

## 2024-04-07 MED ORDER — OXYCODONE HCL 5 MG/5ML PO SOLN: 5.0000 mg | Freq: Once | ORAL | Status: DC | PRN

## 2024-04-07 MED ORDER — HEPARIN (PORCINE) IN NACL 1000-0.9 UT/500ML-% IV SOLN
INTRAVENOUS | Status: DC | PRN
  Administered 2024-04-07: 500 mL

## 2024-04-07 MED ORDER — HEPARIN SODIUM (PORCINE) 1000 UNIT/ML IJ SOLN
INTRAMUSCULAR | Status: DC | PRN
  Administered 2024-04-07: 13500 [IU] via INTRAVENOUS
  Administered 2024-04-07: 1000 [IU] via INTRAVENOUS

## 2024-04-07 MED ORDER — OXYCODONE HCL 5 MG PO TABS: 5.0000 mg | ORAL_TABLET | Freq: Once | ORAL | Status: DC | PRN

## 2024-04-07 MED ORDER — PROPOFOL 10 MG/ML IV BOLUS
INTRAVENOUS | Status: DC | PRN
  Administered 2024-04-07: 50 mg via INTRAVENOUS

## 2024-04-07 MED ORDER — OXYCODONE HCL 5 MG PO TABS
ORAL_TABLET | ORAL | Status: AC
  Filled 2024-04-07: qty 2

## 2024-04-07 MED ORDER — CLOPIDOGREL BISULFATE 75 MG PO TABS
75.0000 mg | ORAL_TABLET | Freq: Every day | ORAL | Status: DC
  Administered 2024-04-07 – 2024-04-08 (×2): 75 mg via ORAL
  Filled 2024-04-07 (×2): qty 1

## 2024-04-07 MED ORDER — SODIUM CHLORIDE 0.9 % IV SOLN: INTRAVENOUS | Status: AC

## 2024-04-07 MED ORDER — CHLORHEXIDINE GLUCONATE 4 % EX SOLN: 60.0000 mL | Freq: Once | CUTANEOUS | Status: DC

## 2024-04-07 MED ORDER — CEFAZOLIN SODIUM-DEXTROSE 2-4 GM/100ML-% IV SOLN
2.0000 g | Freq: Three times a day (TID) | INTRAVENOUS | Status: AC
  Administered 2024-04-07 (×2): 2 g via INTRAVENOUS
  Filled 2024-04-07 (×2): qty 100

## 2024-04-07 MED ORDER — HEPARIN (PORCINE) IN NACL 2000-0.9 UNIT/L-% IV SOLN
INTRAVENOUS | Status: DC | PRN
  Administered 2024-04-07: 1000 mL

## 2024-04-07 MED ORDER — SODIUM CHLORIDE 0.9 % IV SOLN: INTRAVENOUS | Status: DC

## 2024-04-07 MED ORDER — MEXILETINE HCL 150 MG PO CAPS
150.0000 mg | ORAL_CAPSULE | Freq: Two times a day (BID) | ORAL | Status: DC
  Administered 2024-04-07 – 2024-04-08 (×3): 150 mg via ORAL
  Filled 2024-04-07 (×5): qty 1

## 2024-04-07 MED ORDER — FUROSEMIDE 10 MG/ML IJ SOLN
INTRAMUSCULAR | Status: AC
  Filled 2024-04-07: qty 8

## 2024-04-07 MED ORDER — MIDODRINE HCL 2.5 MG PO TABS
2.5000 mg | ORAL_TABLET | Freq: Two times a day (BID) | ORAL | Status: DC
  Administered 2024-04-07 – 2024-04-08 (×2): 2.5 mg via ORAL
  Filled 2024-04-07 (×3): qty 1

## 2024-04-07 MED ORDER — PROTAMINE SULFATE 10 MG/ML IV SOLN
INTRAVENOUS | Status: DC | PRN
  Administered 2024-04-07: 50 mg via INTRAVENOUS

## 2024-04-07 MED ORDER — POTASSIUM CHLORIDE CRYS ER 20 MEQ PO TBCR
40.0000 meq | EXTENDED_RELEASE_TABLET | Freq: Once | ORAL | Status: AC
  Administered 2024-04-07: 40 meq via ORAL
  Filled 2024-04-07: qty 2

## 2024-04-07 MED ORDER — ACETAMINOPHEN 10 MG/ML IV SOLN: 1000.0000 mg | Freq: Once | INTRAVENOUS | Status: DC | PRN

## 2024-04-07 MED ORDER — INSULIN ASPART 100 UNIT/ML IJ SOLN
0.0000 [IU] | INTRAMUSCULAR | Status: DC | PRN
  Administered 2024-04-07: 4 [IU] via SUBCUTANEOUS
  Filled 2024-04-07: qty 4

## 2024-04-07 MED ORDER — OXYCODONE HCL 5 MG PO TABS
5.0000 mg | ORAL_TABLET | ORAL | Status: DC | PRN
  Administered 2024-04-07: 10 mg via ORAL

## 2024-04-07 MED ORDER — LACTATED RINGERS IV SOLN: INTRAVENOUS | Status: DC | PRN

## 2024-04-07 MED ORDER — SODIUM CHLORIDE 0.9% FLUSH: 3.0000 mL | INTRAVENOUS | Status: DC | PRN

## 2024-04-07 MED ORDER — FENTANYL CITRATE (PF) 100 MCG/2ML IJ SOLN: 25.0000 ug | INTRAMUSCULAR | Status: DC | PRN

## 2024-04-07 MED ORDER — LIDOCAINE HCL (PF) 2 % IJ SOLN
INTRAMUSCULAR | Status: DC | PRN
  Administered 2024-04-07: 100 mg via INTRADERMAL

## 2024-04-07 MED ORDER — CHLORHEXIDINE GLUCONATE 4 % EX SOLN: 30.0000 mL | CUTANEOUS | Status: DC

## 2024-04-07 MED ORDER — FUROSEMIDE 10 MG/ML IJ SOLN
80.0000 mg | Freq: Once | INTRAMUSCULAR | Status: AC
  Administered 2024-04-07: 80 mg via INTRAVENOUS

## 2024-04-07 MED ORDER — SODIUM CHLORIDE 0.9 % IV SOLN: 250.0000 mL | INTRAVENOUS | Status: DC | PRN

## 2024-04-07 MED ORDER — IODIXANOL 320 MG/ML IV SOLN
INTRAVENOUS | Status: DC | PRN
  Administered 2024-04-07: 40 mL

## 2024-04-07 MED ORDER — FENTANYL CITRATE (PF) 100 MCG/2ML IJ SOLN
INTRAMUSCULAR | Status: DC | PRN
  Administered 2024-04-07 (×2): 50 ug via INTRAVENOUS

## 2024-04-07 MED ORDER — LEVOTHYROXINE SODIUM 150 MCG PO TABS
150.0000 ug | ORAL_TABLET | Freq: Every day | ORAL | Status: DC
  Administered 2024-04-08: 150 ug via ORAL
  Filled 2024-04-07: qty 1
  Filled 2024-04-07: qty 2

## 2024-04-07 MED ORDER — GABAPENTIN 300 MG PO CAPS
300.0000 mg | ORAL_CAPSULE | Freq: Every day | ORAL | Status: DC
  Administered 2024-04-07: 300 mg via ORAL
  Filled 2024-04-07: qty 1

## 2024-04-07 MED ORDER — PHENYLEPHRINE HCL-NACL 20-0.9 MG/250ML-% IV SOLN: 0.0000 ug/min | INTRAVENOUS | Status: DC

## 2024-04-07 MED ORDER — NITROGLYCERIN IN D5W 200-5 MCG/ML-% IV SOLN: 0.0000 ug/min | INTRAVENOUS | Status: DC

## 2024-04-07 MED ORDER — DIPHENHYDRAMINE HCL 25 MG PO CAPS
25.0000 mg | ORAL_CAPSULE | Freq: Every day | ORAL | Status: DC
  Administered 2024-04-07: 25 mg via ORAL
  Filled 2024-04-07: qty 1

## 2024-04-07 MED ORDER — ONDANSETRON HCL 4 MG/2ML IJ SOLN: 4.0000 mg | Freq: Once | INTRAMUSCULAR | Status: DC | PRN

## 2024-04-07 MED ORDER — SUGAMMADEX SODIUM 200 MG/2ML IV SOLN
INTRAVENOUS | Status: DC | PRN
  Administered 2024-04-07: 200 mg via INTRAVENOUS

## 2024-04-07 MED ORDER — MORPHINE SULFATE (PF) 2 MG/ML IV SOLN: 1.0000 mg | INTRAVENOUS | Status: DC | PRN

## 2024-04-07 MED ORDER — PHENYLEPHRINE HCL (PRESSORS) 10 MG/ML IV SOLN
INTRAVENOUS | Status: DC | PRN
  Administered 2024-04-07: 160 ug via INTRAVENOUS

## 2024-04-07 MED ORDER — PANTOPRAZOLE SODIUM 40 MG PO TBEC
40.0000 mg | DELAYED_RELEASE_TABLET | Freq: Every day | ORAL | Status: DC
  Administered 2024-04-07 – 2024-04-08 (×2): 40 mg via ORAL
  Filled 2024-04-07 (×2): qty 1

## 2024-04-07 MED ORDER — ACETAMINOPHEN 650 MG RE SUPP: 650.0000 mg | Freq: Four times a day (QID) | RECTAL | Status: DC | PRN

## 2024-04-07 MED ORDER — ASPIRIN 81 MG PO TBEC
81.0000 mg | DELAYED_RELEASE_TABLET | Freq: Every day | ORAL | Status: DC
  Administered 2024-04-08: 81 mg via ORAL
  Filled 2024-04-07: qty 1

## 2024-04-07 MED ORDER — INSULIN ASPART 100 UNIT/ML IJ SOLN
0.0000 [IU] | Freq: Three times a day (TID) | INTRAMUSCULAR | Status: DC
  Administered 2024-04-07: 8 [IU] via SUBCUTANEOUS
  Administered 2024-04-07: 4 [IU] via SUBCUTANEOUS
  Administered 2024-04-08: 2 [IU] via SUBCUTANEOUS
  Filled 2024-04-07: qty 8
  Filled 2024-04-07: qty 4
  Filled 2024-04-07: qty 2

## 2024-04-07 MED ORDER — ATORVASTATIN CALCIUM 40 MG PO TABS
40.0000 mg | ORAL_TABLET | Freq: Every day | ORAL | Status: DC
  Administered 2024-04-07 – 2024-04-08 (×2): 40 mg via ORAL
  Filled 2024-04-07 (×2): qty 1

## 2024-04-07 MED ORDER — SODIUM CHLORIDE 0.9% FLUSH
3.0000 mL | Freq: Two times a day (BID) | INTRAVENOUS | Status: DC
  Administered 2024-04-07 – 2024-04-08 (×2): 3 mL via INTRAVENOUS

## 2024-04-07 MED ORDER — FERROUS SULFATE 325 (65 FE) MG PO TABS
325.0000 mg | ORAL_TABLET | ORAL | Status: DC
  Administered 2024-04-08: 325 mg via ORAL
  Filled 2024-04-07: qty 1

## 2024-04-07 SURGICAL SUPPLY — 30 items
BAG SNAP BAND KOVER 36X36 (MISCELLANEOUS) ×4 IMPLANT
BLANKET WARM UNDERBOD FULL ACC (MISCELLANEOUS) IMPLANT
CABLE SURGICAL S-101-97-12 (CABLE) IMPLANT
CATH 26 ULTRA DELIVERY (CATHETERS) IMPLANT
CATH DIAG 6FR PIGTAIL ANGLED (CATHETERS) IMPLANT
CATH INFINITI 5FR ANG PIGTAIL (CATHETERS) IMPLANT
CATH INFINITI 6F AL1 (CATHETERS) IMPLANT
CRIMPER (MISCELLANEOUS) IMPLANT
DEVICE INFLATION ATRION QL2530 (MISCELLANEOUS) IMPLANT
ELECT DEFIB PAD ADLT CADENCE (PAD) IMPLANT
KIT SAPIAN 3 ULTRA RESILIA 26 (Valve) IMPLANT
KIT SYRINGE INJ CVI SPIKEX1 (MISCELLANEOUS) IMPLANT
PACK CARDIAC CATHETERIZATION (CUSTOM PROCEDURE TRAY) ×2 IMPLANT
PAD SORBX EP SHIELD 16.5X12 (MISCELLANEOUS) IMPLANT
SET ATX-X65L (MISCELLANEOUS) IMPLANT
SHEATH INTRODUCER SET 20-26 (SHEATH) IMPLANT
SHEATH PINNACLE 6F 10CM (SHEATH) IMPLANT
SHEATH PINNACLE 8F 10CM (SHEATH) IMPLANT
SHEATH PROBE COVER 6X72 (BAG) IMPLANT
STOPCOCK MORSE 400PSI 3WAY (MISCELLANEOUS) ×4 IMPLANT
SYR CONTROL 10ML ANGIOGRAPHIC (SYRINGE) IMPLANT
TRANSDUCER W/STOPCOCK (MISCELLANEOUS) IMPLANT
TUBING ART PRESS 72 MALE/FEM (TUBING) IMPLANT
TUBING CIL FLEX 10 FLL-RA (TUBING) IMPLANT
WIRE EMERALD 3MM-J .035X150CM (WIRE) IMPLANT
WIRE EMERALD 3MM-J .035X260CM (WIRE) IMPLANT
WIRE EMERALD ST .035X260CM (WIRE) IMPLANT
WIRE MICRO SET SILHO 5FR 7 (SHEATH) IMPLANT
WIRE MICROINTRODUCER 60CM (WIRE) IMPLANT
WIRE SAFARI SM CURVE 275 (WIRE) IMPLANT

## 2024-04-07 NOTE — Interval H&P Note (Signed)
 History and Physical Interval Note:  04/07/2024 6:36 AM  Kayla Schmitt  has presented today for surgery, with the diagnosis of Severe Aortic Stenosis.  The various methods of treatment have been discussed with the patient and family. After consideration of risks, benefits and other options for treatment, the patient has consented to  Procedure(s): Transcatheter Aortic Valve Replacement-Subclavian (Left) TRANSESOPHAGEAL ECHOCARDIOGRAM (N/A) as a surgical intervention.  The patient's history has been reviewed, patient examined, no change in status, stable for surgery.  I have reviewed the patient's chart and labs.  Questions were answered to the patient's satisfaction.     Sherril Shipman K Osiel Stick

## 2024-04-07 NOTE — Discharge Instructions (Signed)
 ACTIVITY AND EXERCISE  Daily activity and exercise are an important part of your recovery. People recover at different rates depending on their general health and type of valve procedure.  Most people recovering from TAVR feel better relatively quickly   No lifting, pushing, pulling more than 10 pounds (examples to avoid: groceries, vacuuming, gardening, golfing):             - For one week with a procedure through the groin.             - For six weeks for procedures through the chest wall or neck. NOTE: You will typically see one of our providers 7-14 days after your procedure to discuss WHEN TO RESUME the above activities.      DRIVING  Do not drive until you are seen for follow up and cleared by a provider. Generally, we ask patient to not drive for 1 week after their procedure.  If you have been told by your doctor in the past that you may not drive, you must talk with him/her before you begin driving again.   DRESSING  Groin site: you may leave the clear dressing over the site until follow up or remove 3-5 days post procedure.   HYGIENE  If you had a femoral (leg) procedure, you may take a shower when you return home. After the shower, pat the site dry. Do NOT use powder, oils or lotions in your groin area until the site has completely healed.   If you had a chest procedure, you may shower when you return home unless specifically instructed not to by your discharging practitioner.             - DO NOT scrub incision; pat dry with a towel.             - DO NOT apply any lotions, oils, powders to the incision.             - No tub baths / swimming for at least 2 weeks.  If you notice any fevers, chills, increased pain, swelling, bleeding or pus, please contact your provider.   ADDITIONAL INFORMATION  If you are going to have an upcoming dental procedure, please contact our office as you will require antibiotics ahead of time to prevent infection on your heart valve.    If you have any  questions or concerns you can call the structural heart phone during normal business hours 8am-4pm. If you have an urgent need after hours or weekends please call [phone] to talk to the on call provider for general cardiology. If you have an emergency that requires immediate attention, please call 911.    After TAVR Checklist  Check  Test Description   Follow up appointment in 1-2 weeks  You will see our structural heart advanced practice provider. Your incision sites will be checked and you will be cleared to drive and resume all normal activities if you are doing well.     1 month echo and follow up (23-75 days) You will have an echo to check on your new heart valve and be seen back in the office by a structural heart advanced practice provider.   Follow up with your primary cardiologist You will need to be seen by your primary cardiologist in the following 3-6 months after your 1 month appointment in the valve clinic.    1 year echo and follow up (60 days +/- the anniversary of your TAVR) You will have another echo  to check on your heart valve after 1 year and be seen back in the office by a structural heart advanced practice provider. This your last structural heart visit.   Bacterial endocarditis prophylaxis  You will have to take antibiotics for the rest of your life before all dental procedures (even teeth cleanings) to protect your heart valve. Antibiotics are also required before some surgeries. Please check with your cardiologist before scheduling any surgeries. Also, please make sure to tell us  if you have a penicillin allergy as you will require an alternative antibiotic.

## 2024-04-07 NOTE — Progress Notes (Signed)
 63fr arterial sheath removed intact. Manual pressure applied for total of .75fr venous sheath removed with manual pressure applied x . No bleeding or hematoma noted. 4x4 gauze/tegaderm dressing applied. Aline sheath removed from right radial with manual pressure applied.SABRA Post activity and precautions explained. Attempted to wean from simple mask. Patient desats to mid to low 80s. Patient states she wears 4 to 5 liters nasal cannula at home. EDP elevated. Mallie Hummer notified. Orders given for IV lasix . Informed that patient's baseline O2 sat is 80s. Will continue to monitor per orders and protocol.Jolee Critcher E

## 2024-04-07 NOTE — Anesthesia Postprocedure Evaluation (Signed)
 Anesthesia Post Note  Patient: Kayla Schmitt  Procedure(s) Performed: Transcatheter Aortic Valve Replacement-Subclavian (Left) TRANSESOPHAGEAL ECHOCARDIOGRAM     Patient location during evaluation: PACU Anesthesia Type: General Level of consciousness: awake and alert Pain management: pain level controlled Vital Signs Assessment: post-procedure vital signs reviewed and stable Respiratory status: spontaneous breathing, nonlabored ventilation, respiratory function stable and patient connected to nasal cannula oxygen  Cardiovascular status: blood pressure returned to baseline and stable Postop Assessment: no apparent nausea or vomiting Anesthetic complications: no   There were no known notable events for this encounter.  Last Vitals:  Vitals:   04/07/24 1135 04/07/24 1140  BP:    Pulse: 88 83  Resp: (!) 21 17  Temp:    SpO2: (!) 83% (!) 84%    Last Pain:  Vitals:   04/07/24 1038  TempSrc:   PainSc: 8                  Lynwood MARLA Cornea

## 2024-04-07 NOTE — Plan of Care (Signed)
   Problem: Education: Goal: Knowledge of General Education information will improve Description Including pain rating scale, medication(s)/side effects and non-pharmacologic comfort measures Outcome: Progressing   Problem: Health Behavior/Discharge Planning: Goal: Ability to manage health-related needs will improve Outcome: Progressing

## 2024-04-07 NOTE — Discharge Summary (Incomplete)
 HEART AND VASCULAR CENTER   MULTIDISCIPLINARY HEART VALVE TEAM  Discharge Summary    Patient ID: Kayla Schmitt MRN: [id number]; DOB: March 06, 1966  Admit date: 04/07/2024 Discharge date: 04/07/2024  PCP:  [name], NP  [facility] Cardiologist:  Diannah SHAUNNA Maywood, MD  [facility] Structural heart: Lurena MARLA Red, MD [facility] Electrophysiologist:  None   Discharge Diagnoses    Principal Problem:   S/P TAVR (transcatheter aortic valve replacement) Active Problems:   PVD (peripheral vascular disease)   Type 2 diabetes mellitus with complication (HCC)   GERD (gastroesophageal reflux disease)   Hyperlipidemia   Hypertension   Hypothyroidism   Obesity, class 1   Critical limb ischemia of both lower extremities (HCC)   CAD (coronary artery disease)   Severe aortic stenosis   History of GI bleed   Necrotizing fasciitis (HCC)   S/P BKA (below knee amputation) unilateral, left (HCC)   Acute on chronic systolic CHF (congestive heart failure) (HCC)   CKD stage 3b, GFR 30-44 ml/min (HCC)   Chronic anemia   Allergies Allergies  Allergen Reactions   Trental  [Pentoxifylline ] Nausea And Vomiting   Vibramycin [Doxycycline] Nausea And Vomiting    Diagnostic Studies/Procedures    HEART AND VASCULAR CENTER  TAVR OPERATIVE NOTE     Date of Procedure:                04/07/2024   Preoperative Diagnosis:      Severe Aortic Stenosis    Postoperative Diagnosis:    Same    Procedure:        Transcatheter Aortic Valve Replacement - Left Subclavian Surgical Approach             Edwards Sapien 3 Resilia THV (size 26 mm, model # 9755RLS, serial # [id number])              Co-Surgeons:                        Dorise LOIS Fellers, MD and Lurena Red, MD Anesthesiologist:                  Keneth   Echocardiographer:              Delford   Pre-operative Echo Findings: Severe aortic stenosis Sever left ventricular systolic dysfunction   Post-operative Echo  Findings: No paravalvular leak Unchanged left ventricular systolic function   Left Heart Catheterization Findings: Left ventricular end-diastolic pressure of _____________    Echo 04/08/24: completed but pending formal read at the time of discharge   History of Present Illness     Shaolin Ayako Tapanes is a 58 y.o. female with a history of hypotension, HLD, T2DM on insulin , multivessel CAD, severe PAD s/p L BKA and R TMA, wheelchair bound awaiting LLE prothesis, CKD IIIb, chronic anemia/hx GI bleed 2/2 AVMs, PVCs on mexiletine, moderate MAC w/ mild-moderate MR, ICM with HFrEF (EF 25-30%) and severe LFLG AS who presented to [facility] on 04/07/24 for planned TAVR.   Admitted 07/2022 with NSTEMI. Cath showed 3V CAD and significant MR. CABG and MVR recommended, but surgery turned down due to multiple medical issues.   Admitted 09/2023 for MRSA bacteremia and necrotizing fasciitis. Required L BKA followed by revision. TEE 09/2023 with EF 60-65%, mild to moderate MR and moderate to severe AS, but no evidence of endocarditis. Four week course of IV abx. Discharged 10/29/23 to CIR for rehab. Transferred back to [facility] 7/25 with  a/c HF, and concern for low-output with acute respiratory failure. She was diuresed with IV lasix . She required intubation for flash pulmonary edema, and started on inotropic support DBA. Renal function continued to decline and she transiently required CRRT. Eventually underwent RHC showing severely elevated PCWP, mildly elevated RAP, preserved CO. Course complicated by hematuria, requiring PRBCs. She went to OR for cystoscopy and declotting of bladder. She was able to be extubated, drips weaned and GDMT titrated, however limited by soft BP and creatinine. R/LHC 01/17/24 demonstrated multivessel disease relatively unchanged versus prior study with occluded left circumflex, high-grade mid LAD, and moderate right coronary artery disease. RHC RA 8, PAP 53/29 (40), PCWP 29 w/ v waves to 44 mmHg,  CO 6.7 L/min and Fick cardiac index of 3.6 L/min/m, PVR 1.6 WU, PAPi 3.    Admitted 12/2023 with A/C HFrEF. Diuresed well with high doses of IV lasix . Received 1uPRBC for IDA with known AMVs. Asymptomatic at discharge despite elevated ReDs (chronically elevated). Discharged with 3 doses of Furoscix  and higher PO diuretic dose.   The patient was evaluated by the multidisciplinary valve team and felt to have severe, symptomatic aortic stenosis and to be a suitable candidate for TAVR, which was set up for 04/07/24.   Hospital Course     Consultants: none   Severe AS:  -- S/p TAVR with a 26 mm Edwards Sapien 3 Ultra Resilia  THV via the left subclavian approach on 04/07/24.  -- Post operative echo completed but pending formal read. -- Groin sites are stable.  -- ECG with *** and no high grade heart block. -- Continue Asprin 81mg  daily and Plavix  75mg  daily. / Eliquis 5mg  BID   -- Met with cardiac rehab to discuss CRP phase II.  -- Plan for discharge home today with close follow up in the outpatient setting.   Acute on Chronic systolic CHF: -- LVEDP 28 mm hg at the time of TAVR.  -- Treated with IV Lasix  80 mg and Kdur 40 meq.SABRA  -- GDMT limited by CKD and need for midodrine  -- Resume home torsemide  100 mg bid + 40 KCL bid. -- Continue metolazone  2.5 mg + extra 40 KCL every Sunday. -- Continue midodrine  2.5 mg bid. -- Not on SGLT2i with hx UTI.   CAD: -- NSTEMI in 07/2022. Multivessel CAD. CABG had been recommended but managed medically due to other active medical issues. -- LHC 01/17/24 demonstrated multivessel disease relatively unchanged versus prior study with occluded left circumflex, high-grade mid LAD, and moderate right coronary artery disease. -- No chest pain. -- Continue DAPT with ASA + Plavix . -- Continue atorvastatin  40 mg daily.   PAD -- Hx SFA stents -- Previous necrotizing fasciitis -- S/p left BKA 09/2023 -- S/p R TMA in 07/2022 -- Followed by Dr. Gretta and will require  aortogram, lower extremity arteriogram with a focus on the right leg for her in-stent stenosis versus short focal occlusion (scheduled 12/18). -- On aspirin /plavix  + statin.    CKD IIIb: - recent AKIs in setting of volume overload/low-output>>Started on CVVH 11/16/23, stopped 11/19/23, followed by recurrent AKI 2/2 bladder outlet obstruction from extensive clots in bladder.  S/p cystoscopy and clot removal - Baseline SCr now ~ 2.5 - no SGLT2i as above - Follows with [facility] today.   PVCs: - Frequent on previous EKGs. Has been asymptomatic  - 2 week zio 9/25: SR avg rate 82 bpm, SVT runs (longest 1 min 19 seconds), 13% PVC burden - Sleep study ordered,  will see if we can arrange this - Continue mexiletine 150 mg bid   Chronic Anemia: - Hx GI bleed from AVMs. ? Heyde syndrome - Required transfusions 7/25 and 9/25 - Hg baseline ~8-9.  _____________  Discharge Vitals Blood pressure 134/87, pulse 86, temperature (!) 97 F (36.1 C), temperature source Temporal, resp. rate (!) 21, height 5' 4 (1.626 m), weight 90.7 kg, SpO2 95%.  Filed Weights   04/07/24 0605  Weight: 90.7 kg     GEN: Well nourished, well developed in no acute distress NECK: No JVD CARDIAC: ***RRR, no murmurs, rubs, gallops RESPIRATORY:  Clear to auscultation without rales, wheezing or rhonchi  ABDOMEN: Soft, non-tender, non-distended EXTREMITIES:  No edema; No deformity.  Groin sites clear without hematoma or ecchymosis. ****   Disposition   Pt is being discharged home today in good condition.  Follow-up Plans & Appointments     Follow-up Information     Sebastian Lamarr SAUNDERS, PA-C. Go on 04/16/2024.   Specialties: Cardiology, Radiology Why: @ 1pm, please arrive at least 20 minutes early. Contact information: [address] [place] [address] [phone]                  Discharge Medications   Allergies as of 04/07/2024       Reactions   Trental  [pentoxifylline ] Nausea And  Vomiting   Vibramycin [doxycycline] Nausea And Vomiting     Med Rec must be completed prior to using this [facility]***           Outstanding Labs/Studies   ***  ______________________  Duration of Discharge Encounter: APP Time: *** minutes    Signed, Lamarr Sebastian, PA-C 04/07/2024, 11:47 AM [phone]

## 2024-04-07 NOTE — Transfer of Care (Signed)
 Immediate Anesthesia Transfer of Care Note  Patient: Kayla Schmitt  Procedure(s) Performed: Transcatheter Aortic Valve Replacement-Subclavian (Left) TRANSESOPHAGEAL ECHOCARDIOGRAM  Patient Location: Cath Lab  Anesthesia Type:General  Level of Consciousness: awake, alert , oriented, and patient cooperative  Airway & Oxygen  Therapy: Patient Spontanous Breathing and Patient connected to face mask oxygen   Post-op Assessment: Report given to RN, Post -op Vital signs reviewed and stable, Patient moving all extremities, and Patient moving all extremities X 4  Post vital signs: Reviewed and stable  Last Vitals:  Vitals Value Taken Time  BP 113/52 04/07/24 1000  Temp    Pulse 84 04/07/24 1000  Resp 10 04/07/24 1000  SpO2 95 04/07/24 1000    Last Pain:  Vitals:   04/07/24 0634  TempSrc:   PainSc: 0-No pain      Patients Stated Pain Goal: 0 (04/07/24 9365)  Complications: There were no known notable events for this encounter.

## 2024-04-07 NOTE — Progress Notes (Signed)
  HEART AND VASCULAR CENTER   MULTIDISCIPLINARY HEART VALVE TEAM  Patient doing well s/p TAVR. She is hemodynamically stable. Groin sites stable. ECG with sinus with new LBBB but no high grade block. She is hypoxic despite 02 (I think some of this is chronic, but. LVEDP 28 mm hg at the time of TAVR.) Will treat with one dose of IV Lasix  80 mg and Kdur 40 meq. Plan to transfer to from cath lab holding to 4E when bed available. Early ambulation after bedrest completed and hopeful discharge over the next 24-48 hours.   Lamarr Hummer PA-C  [facility]  Pager [phone]

## 2024-04-07 NOTE — Progress Notes (Signed)
 Pt arrived from ...cath.., A/ox 4...pt denies any pain, MD aware,CCMD called. CHG bath given,no further needs at this time

## 2024-04-07 NOTE — Anesthesia Procedure Notes (Signed)
 Arterial Line Insertion Start/End12/12/2023 7:35 AM, 04/07/2024 7:35 AM Performed by: CRNA  Patient location: OR. Preanesthetic checklist: patient identified, IV checked, site marked, risks and benefits discussed, surgical consent, monitors and equipment checked, pre-op evaluation, timeout performed and anesthesia consent Lidocaine  1% used for infiltration Right, radial was placed Hand hygiene performed , maximum sterile barriers used  and Seldinger technique used Allen's test indicative of satisfactory collateral circulation Attempts: 2 Following insertion, dressing applied and Biopatch. Post procedure assessment: normal  Patient tolerated the procedure well with no immediate complications.

## 2024-04-07 NOTE — Progress Notes (Signed)
  Echocardiogram Echocardiogram Transesophageal has been performed.  Kayla Schmitt 04/07/2024, 9:19 AM

## 2024-04-07 NOTE — Op Note (Addendum)
 HEART AND VASCULAR CENTER   MULTIDISCIPLINARY HEART VALVE TEAM   TAVR OPERATIVE NOTE   Date of Procedure:  04/07/2024  Preoperative Diagnosis: Severe Aortic Stenosis   Postoperative Diagnosis: Same   Procedure:   Transcatheter Aortic Valve Replacement - Left subclavian approach.  Edwards Sapien 3 Ultra Resilia THV (size 26 mm, model # 9755RSL, serial # [id number])   Co-Surgeons:  Dorise LOIS Fellers, MD and Lurena Red, MD   Anesthesiologist:  Keneth, MD  Echocardiographer:  Delford, MD  Pre-operative Echo Findings: Severe aortic stenosis Severe left ventricular systolic dysfunction   Post-operative Echo Findings: No paravalvular leak Unchanged left ventricular systolic dysfunction    BRIEF CLINICAL NOTE AND INDICATIONS FOR SURGERY  This 58 year old woman has stage D, severe, symptomatic, low-flow/low gradient aortic stenosis with NYHA class II symptoms of exertional fatigue and shortness of breath consistent with chronic diastolic congestive heart failure.  She had an admission in July 2025 for acute heart failure and echocardiogram at that time showed an ejection fraction of 25%.  I have personally reviewed her 2D echocardiogram, cardiac catheterization, and CTA studies.  Her most recent complete echo was on 11/20/2023 and showed a severely calcified and thickened aortic valve with restricted leaflet mobility.  The mean gradient was 22 mmHg with a valve area of 0.82 cm with a very low stroke-volume index of 20.  Cardiac catheterization showed stable multivessel coronary disease with moderate pulmonary hypertension.  She has no anginal symptoms and this has been treated medically.  She has relatively small diffusely diseased vessels.  I agree that aortic valve replacement is indicated in this patient for relief of her symptoms and to prevent progressive left ventricular dysfunction and recurrent acute heart failure.  Given her low EF and comorbidities I do not think she is a candidate  for open surgical aortic valve replacement.  I think transcatheter aortic valve replacement would be a reasonable alternative for treating her.  Her gated cardiac CTA shows anatomy suitable for TAVR using a 26 mm SAPIEN 3 valve.  She has severe aortoiliac and femoral calcific atherosclerosis that would probably preclude a safe transfemoral approach.  I think alternative access via the right common carotid artery or left subclavian artery would be possible.   The patient was counseled at length regarding treatment alternatives for management of severe symptomatic aortic stenosis. The risks and benefits of surgical intervention has been discussed in detail. Long-term prognosis with medical therapy was discussed. Alternative approaches such as conventional surgical aortic valve replacement, transcatheter aortic valve replacement, and palliative medical therapy were compared and contrasted at length. This discussion was placed in the context of the patient's own specific clinical presentation and past medical history. All of her questions have been addressed.    Following the decision to proceed with transcatheter aortic valve replacement, a discussion was held regarding what types of management strategies would be attempted intraoperatively in the event of life-threatening complications, including whether or not the patient would be considered a candidate for the use of cardiopulmonary bypass and/or conversion to open sternotomy for attempted surgical intervention.  Given her degree of vascular disease and comorbidities I do not think she is a candidate for emergent sternotomy to manage any intraoperative complications.  The patient has been advised of a variety of complications that might develop including but not limited to risks of death, stroke, paravalvular leak, aortic dissection or other major vascular complications, aortic annulus rupture, device embolization, cardiac rupture or perforation, mitral  regurgitation, acute myocardial infarction, arrhythmia, heart  block or bradycardia requiring permanent pacemaker placement, congestive heart failure, respiratory failure, renal failure, pneumonia, infection, other late complications related to structural valve deterioration or migration, or other complications that might ultimately cause a temporary or permanent loss of functional independence or other long term morbidity. The patient provides full informed consent for the procedure as described and all questions were answered.    DETAILS OF THE OPERATIVE PROCEDURE  PREPARATION:    The patient was brought to the operating room on the above mentioned date and appropriate monitoring was established by the anesthesia team. The patient was placed in the supine position on the operating table.  Intravenous antibiotics were administered. General endotracheal anesthesia was induced uneventfully.  Baseline transesophageal echocardiogram was performed. The patient's neck, chest and both groins were prepped and draped in a sterile manner. A time out procedure was performed.   PERIPHERAL ACCESS:    Using the modified Seldinger technique, femoral arterial and venous access was obtained with placement of 6 Fr sheaths on the right side.  A pigtail diagnostic catheter was passed through the right arterial sheath under fluoroscopic guidance into the aortic root.  Aortic root angiography was performed in order to determine the optimal angiographic angle for valve deployment.  LEFT SUBCLAVIAN ACCESS:   A transverse incision was made below the left clavicle and carried down through the subcutaneous tissue using electrocautery. The pectoralis major muscle was split along its fibers and the pectoralis minor muscle retracted laterally. The left axillary artery was identified and encircled with a vessel loop. The patient was heparinized systemically and ACT verified > 250 seconds.  A double concentric purse string  suture of CV-4 gortex was placed in the anterior wall of the artery. The artery was cannulated with a needle and a J- wire advanced into the ascending aorta. An 8 F sheath was inserted over the wire. The aortic valve was crossed with a JR4 catheter and a straight wire. This was exchanged for a pigtail catheter and position was confirmed in the LV apex. Simultaneous LV and Ao pressures were recorded.  The pigtail catheter was exchanged for a Sarari wire in the LV apex.  Direct LV pacing threshold through the [facility] wire was tested and satisfactory. Then a 39F E-sheath was inserted into the axillary artery and the tip advanced into the aortic arch.  BALLOON AORTIC VALVULOPLASTY:   Not performed.  TRANSCATHETER HEART VALVE DEPLOYMENT:   An Edwards Sapien 3 Ultra transcatheter heart valve (size 26 mm) was prepared and crimped per manufacturer's guidelines, and the proper orientation of the valve is confirmed on the [employer] delivery system. The valve was advanced through the introducer sheath using normal technique until in an appropriate position in the ascending aorta beyond the sheath tip. The balloon was then retracted and using the fine-tuning wheel was centered on the valve. The valve was carefully positioned across the aortic valve annulus. The Commander catheter was retracted using normal technique. Once final position of the valve has been confirmed by angiographic assessment, the valve is deployed while temporarily holding ventilation and during rapid ventricular pacing to maintain systolic blood pressure < 50 mmHg and pulse pressure < 10 mmHg. The balloon inflation is held for >3 seconds after reaching full deployment volume. Once the balloon has fully deflated the balloon is retracted into the ascending aorta and valve function is assessed using echocardiography. There is felt to be no paravalvular leak and no central aortic insufficiency. Post-procedural gradients were acceptable. The  patient's hemodynamic recovery  following valve deployment is good.  The deployment balloon and guidewire are both removed.     PROCEDURE COMPLETION:   The sheath was removed and axillary artery closure performed.  Protamine  was administered once arterial repair was complete. The axillary incision was closed using 2-0 Vicryl to approximate the pectoralis muscle, 3-0 Vicryl subcutaneous suture and 3-0 Vicryl subcuticular skin closure followed by Dermabond. The femoral sheaths were left in place with manual pressure used for arterial and venous hemostasis in the cath lab recovery area.    The patient tolerated the procedure well and is transported to the cath lab recovery area in stable condition. There were no immediate intraoperative complications. All sponge instrument and needle counts are verified correct at completion of the operation.   No blood products were administered during the operation.  The patient received a total of 40 mL of intravenous contrast during the procedure.   Dorise MARLA Fellers, MD 04/07/2024

## 2024-04-07 NOTE — Anesthesia Preprocedure Evaluation (Addendum)
 Anesthesia Evaluation  Patient identified by MRN, date of birth, ID band Patient awake    Reviewed: Allergy & Precautions, NPO status , Patient's Chart, lab work & pertinent test results, reviewed documented beta blocker date and time   History of Anesthesia Complications (+) PONV and history of anesthetic complications  Airway Mallampati: III  TM Distance: >3 FB Neck ROM: Limited    Dental  (+) Edentulous Upper, Loose,    Pulmonary shortness of breath, with exertion and at rest, neg COPD, former smoker   breath sounds clear to auscultation       Cardiovascular hypertension, + CAD, + Past MI, + Peripheral Vascular Disease and +CHF  + Valvular Problems/Murmurs AS  Rhythm:Regular Rate:Normal + Systolic murmurs    Neuro/Psych neg Seizures  Anxiety      Neuromuscular disease    GI/Hepatic ,GERD  ,,(+) neg Cirrhosis        Endo/Other  diabetes, Type 2Hypothyroidism    Renal/GU CRF and DialysisRenal disease     Musculoskeletal  (+) Arthritis ,    Abdominal   Peds  Hematology  (+) Blood dyscrasia, anemia   Anesthesia Other Findings   Reproductive/Obstetrics                              Anesthesia Physical Anesthesia Plan  ASA: 4  Anesthesia Plan: General   Post-op Pain Management:    Induction: Intravenous  PONV Risk Score and Plan: 3 and Ondansetron  and Propofol  infusion  Airway Management Planned: Natural Airway and Simple Face Mask  Additional Equipment: Arterial line  Intra-op Plan:   Post-operative Plan:   Informed Consent: I have reviewed the patients History and Physical, chart, labs and discussed the procedure including the risks, benefits and alternatives for the proposed anesthesia with the patient or authorized representative who has indicated his/her understanding and acceptance.     Dental advisory given  Plan Discussed with: CRNA  Anesthesia Plan Comments:           Anesthesia Quick Evaluation

## 2024-04-07 NOTE — Op Note (Signed)
 [facility]  TAVR OPERATIVE NOTE   Date of Procedure:  04/07/2024  Preoperative Diagnosis: Severe Aortic Stenosis   Postoperative Diagnosis: Same   Procedure:   Transcatheter Aortic Valve Replacement - Left Subclavian Surgical Approach  Edwards Sapien 3 Resilia THV (size 26 mm, model # 9755RLS, serial # [id number])   Co-Surgeons:  Dorise LOIS Fellers, MD and Lurena Red, MD Anesthesiologist:  Keneth  Echocardiographer:  Delford  Pre-operative Echo Findings: Severe aortic stenosis Sever left ventricular systolic dysfunction  Post-operative Echo Findings: No paravalvular leak Unchanged left ventricular systolic function  Left Heart Catheterization Findings: Left ventricular end-diastolic pressure of   BRIEF CLINICAL NOTE AND INDICATIONS FOR SURGERY  The patient is a 58 year old female with a history of hyperlipidemia, type 2 diabetes on insulin , coronary artery disease with acute coronary syndrome 2024 treated medically due to multivessel disease and not a surgical candidate, peripheral arterial disease status post bilateral SFA stents, bilateral TMA, and 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  During the course of the patient's preoperative work up they have been evaluated comprehensively by a multidisciplinary team of specialists coordinated through the Multidisciplinary Heart Valve Clinic in the [facility].  They have been demonstrated to suffer from symptomatic severe aortic stenosis as noted above. The patient has been counseled extensively as to the relative risks and benefits of all options for the  treatment of severe aortic stenosis including long term medical therapy, conventional surgery for aortic valve replacement, and transcatheter aortic valve replacement.  The patient has been independently evaluated by Dr. Fellers with CT surgery and they are felt to be at high risk for conventional surgical aortic valve replacement. The surgeon indicated the patient would be a poor candidate for conventional surgery. Based upon review of all of the patient's preoperative diagnostic tests they are felt to be candidate for transcatheter aortic valve replacement using the transfemoral approach as an alternative to high risk conventional surgery.    Following the decision to proceed with transcatheter aortic valve replacement, a discussion has been held regarding what types of management strategies would be attempted intraoperatively in the event of life-threatening complications, including whether or not the patient would be considered a candidate for the use of cardiopulmonary bypass and/or conversion to open sternotomy for attempted surgical intervention.  The patient has been advised of a variety of complications that might develop peculiar to this approach including but not limited to risks of death, stroke, paravalvular leak, aortic dissection or other major vascular complications, aortic annulus rupture, device embolization, cardiac rupture or perforation, acute myocardial infarction, arrhythmia, heart block or bradycardia requiring permanent pacemaker placement, congestive heart failure, respiratory failure, renal failure, pneumonia, infection, other late complications related to structural valve deterioration or migration, or other complications that might ultimately cause a temporary or permanent loss of functional independence or other long term morbidity.  The patient provides full informed consent for the procedure as described and all questions were answered preoperatively.    DETAILS OF THE OPERATIVE  PROCEDURE  PREPARATION:   The patient is brought to the operating room on the above mentioned date and central monitoring was established by the anesthesia team. The  patient is placed in the supine position on the operating table.  Intravenous antibiotics are administered. Conscious sedation is used.   Baseline transesophageal echocardiogram was performed. The patient's chest, abdomen, both groins, and both lower extremities are prepared and draped in a sterile manner. A time out procedure is performed.   PERIPHERAL ACCESS:   Using the modified Seldinger technique, femoral arterial and venous access were obtained with placement of a 6 Fr sheath in the right common femoral artery and a 6 Fr sheath in the right femoral vein using u/s guidance.  A pigtail diagnostic catheter was passed through the femoral arterial sheath under fluoroscopic guidance into the aortic root. Aortic root angiography was performed in order to determine the optimal angiographic angle for valve deployment.  SUBCLAVIAN ACCESS:  Please refer to Dr. Jeralyn operative report.  TRANSCATHETER HEART VALVE DEPLOYMENT:  After the left subclavian artery was exposed, an 8 French sheath was placed.  Wire position in the ascending aorta was then established.   An AL-1 catheter was used to direct a straight-tip exchange length wire across the native aortic valve into the left ventricle. This was exchanged out for a pigtail catheter and position was confirmed in the LV apex. Simultaneous left ventricular, aortic, and left ventricular end-diastolic pressures were recorded.  The pigtail catheter was then exchanged for an Safari wire in the LV apex.  Direct LV pacing thresholds were assessed and found to be adequate.  A 14 French E-sheath, with the E facing the patient's feet, was introduced into the left subclavian artery with a Safari wire.  The valve was advanced through the introducer sheath using normal technique until in an appropriate  position in the ascending thoracic aorta beyond the sheath tip. The balloon was then retracted and using the fine-tuning wheel was centered on the valve. The valve was then advanced across the aortic arch using appropriate flexion of the catheter. The valve was carefully positioned across the aortic valve annulus. The Commander catheter was retracted using normal technique. Once final position of the valve has been confirmed by angiographic assessment, the valve is deployed while temporarily holding ventilation and during rapid ventricular pacing to maintain systolic blood pressure < 50 mmHg and pulse pressure < 10 mmHg. The balloon inflation is held for >3 seconds after reaching full deployment volume. Once the balloon has fully deflated the balloon is retracted into the ascending aorta and valve function is assessed using TTE. There is felt to be no paravalvular leak and no central aortic insufficiency.  The patient's hemodynamic recovery following valve deployment is good.  The deployment balloon and guidewire are both removed. Echo demostrated acceptable post-procedural gradients, stable mitral valve function, and no AI.   PROCEDURE COMPLETION:  Please refer to Dr. Jeralyn operative report for surgical closure of the left subclavian surgical access site.  The right common femoral arterial and venous sheaths were secured in place and will be managed with manual compression in the holding area.  Patient will be extubated per anesthesia.   The patient tolerated the procedure well and is transported to the holding area in stable condition. There were no immediate intraoperative complications. All sponge instrument and needle counts are verified correct at completion of the operation.   No blood products were administered during the operation.  The patient received a total of 40 mL of intravenous contrast during the procedure.  Padraic Marinos K Sydney Azure MD 04/07/2024 9:50 AM

## 2024-04-07 NOTE — Anesthesia Procedure Notes (Signed)
 Procedure Name: Intubation Date/Time: 04/07/2024 7:54 AM  Performed by: Arvell Edsel HERO, CRNAPre-anesthesia Checklist: Patient identified, Emergency Drugs available, Suction available, Patient being monitored and Timeout performed Patient Re-evaluated:Patient Re-evaluated prior to induction Oxygen  Delivery Method: Circle system utilized Preoxygenation: Pre-oxygenation with 100% oxygen  Induction Type: IV induction Ventilation: Mask ventilation without difficulty Laryngoscope Size: Mac and 3 Grade View: Grade II Tube type: Oral Tube size: 7.0 mm Number of attempts: 1 Airway Equipment and Method: Patient positioned with wedge pillow and Stylet Placement Confirmation: ETT inserted through vocal cords under direct vision, positive ETCO2 and breath sounds checked- equal and bilateral Secured at: 22 cm Tube secured with: Tape Dental Injury: Teeth and Oropharynx as per pre-operative assessment

## 2024-04-08 ENCOUNTER — Inpatient Hospital Stay (HOSPITAL_COMMUNITY)

## 2024-04-08 ENCOUNTER — Encounter (HOSPITAL_COMMUNITY): Payer: Self-pay | Admitting: Internal Medicine

## 2024-04-08 DIAGNOSIS — Z952 Presence of prosthetic heart valve: Secondary | ICD-10-CM | POA: Diagnosis not present

## 2024-04-08 LAB — MAGNESIUM: Magnesium: 2 mg/dL (ref 1.7–2.4)

## 2024-04-08 LAB — ECHOCARDIOGRAM COMPLETE
AR max vel: 1.63 cm2
AV Area VTI: 1.63 cm2
AV Area mean vel: 1.66 cm2
AV Mean grad: 8 mmHg
AV Peak grad: 15.4 mmHg
Ao pk vel: 1.96 m/s
Area-P 1/2: 4.01 cm2
Calc EF: 25.1 %
Height: 64 in
MV VTI: 1.48 cm2
S' Lateral: 5 cm
Single Plane A2C EF: 27.8 %
Single Plane A4C EF: 22.6 %
Weight: 3463.87 [oz_av]

## 2024-04-08 LAB — CBC
HCT: 28.2 % — ABNORMAL LOW (ref 36.0–46.0)
Hemoglobin: 8.2 g/dL — ABNORMAL LOW (ref 12.0–15.0)
MCH: 27.7 pg (ref 26.0–34.0)
MCHC: 29.1 g/dL — ABNORMAL LOW (ref 30.0–36.0)
MCV: 95.3 fL (ref 80.0–100.0)
Platelets: 252 K/uL (ref 150–400)
RBC: 2.96 MIL/uL — ABNORMAL LOW (ref 3.87–5.11)
RDW: 19.9 % — ABNORMAL HIGH (ref 11.5–15.5)
WBC: 6.8 K/uL (ref 4.0–10.5)
nRBC: 0 % (ref 0.0–0.2)

## 2024-04-08 LAB — BASIC METABOLIC PANEL WITH GFR
Anion gap: 10 (ref 5–15)
BUN: 57 mg/dL — ABNORMAL HIGH (ref 6–20)
CO2: 26 mmol/L (ref 22–32)
Calcium: 8.7 mg/dL — ABNORMAL LOW (ref 8.9–10.3)
Chloride: 103 mmol/L (ref 98–111)
Creatinine, Ser: 2.31 mg/dL — ABNORMAL HIGH (ref 0.44–1.00)
GFR, Estimated: 24 mL/min — ABNORMAL LOW (ref >60)
Glucose, Bld: 109 mg/dL — ABNORMAL HIGH (ref 70–99)
Potassium: 4.2 mmol/L (ref 3.5–5.1)
Sodium: 139 mmol/L (ref 135–145)

## 2024-04-08 LAB — GLUCOSE, CAPILLARY: Glucose-Capillary: 143 mg/dL — ABNORMAL HIGH (ref 70–99)

## 2024-04-08 MED ORDER — TORSEMIDE 20 MG PO TABS
100.0000 mg | ORAL_TABLET | Freq: Two times a day (BID) | ORAL | Status: DC
  Administered 2024-04-08: 100 mg via ORAL
  Filled 2024-04-08: qty 5

## 2024-04-08 MED ORDER — POTASSIUM CHLORIDE CRYS ER 20 MEQ PO TBCR
60.0000 meq | EXTENDED_RELEASE_TABLET | Freq: Every day | ORAL | Status: DC
  Administered 2024-04-08: 60 meq via ORAL
  Filled 2024-04-08: qty 3

## 2024-04-08 NOTE — Progress Notes (Signed)
°  Echocardiogram 2D Echocardiogram has been performed.  Kayla Schmitt 04/08/2024, 9:50 AM

## 2024-04-08 NOTE — Progress Notes (Signed)
°   04/08/24 1224  TOC Brief Assessment  Insurance and Status Reviewed  Patient has primary care physician Yes  Home environment has been reviewed home  Prior level of function: independent  Prior/Current Home Services No current home services  Social Drivers of Health Review SDOH reviewed no interventions necessary  Readmission risk has been reviewed Yes  Transition of care needs no transition of care needs at this time    Pt s/p TAVR, stable for transition home today, no HH or DME needs noted. Pt has home 02- 2L that she uses PRN. Family to transport home

## 2024-04-08 NOTE — Progress Notes (Signed)
 Discussed with pt restrictions, NTG, and CRPII. Pt receptive but not interested in CRPII due to distance. She hopes to get her prosthesis soon.  [id number] Aliene Aris BS, ACSM-CEP 04/08/2024 10:29 AM

## 2024-04-08 NOTE — Progress Notes (Signed)
D/c tele and IV. Went over AVS with pt and all questions were addressed.   Lavenia Atlas, RN

## 2024-04-09 ENCOUNTER — Telehealth: Payer: Self-pay

## 2024-04-09 NOTE — Telephone Encounter (Signed)
 Patient contacted regarding discharge from [facility] on 04/08/24  Patient understands to follow up with provider Izetta Hummer, PA-C on 04/20/24 at 9:05AM at [address] Location. Patient understands discharge instructions? Yes Patient understands medications and regiment? Yes Patient understands to bring all medications to this visit? Yes  Patient requested to move her TOC appt from 12/15 to 12/22 due to a scheduling conflict. Still within the 14 day window for TOC visit.

## 2024-04-10 ENCOUNTER — Ambulatory Visit: Admitting: Physician Assistant

## 2024-04-13 ENCOUNTER — Ambulatory Visit: Admitting: Physician Assistant

## 2024-04-15 ENCOUNTER — Ambulatory Visit: Admitting: Physician Assistant

## 2024-04-15 ENCOUNTER — Encounter: Payer: Self-pay | Admitting: Family

## 2024-04-15 DIAGNOSIS — Z89512 Acquired absence of left leg below knee: Secondary | ICD-10-CM

## 2024-04-15 NOTE — Progress Notes (Signed)
 Office Visit Note   Patient: Kayla Schmitt           Date of Birth: December 13, 1965           MRN: [id number] Visit Date: 04/15/2024              Requested by: [name], NP [address] [place],  [place] [address] PCP: [name], NP  No chief complaint on file.     HPI: The patient is a 58 year old woman who presents in follow-up status post left transtibial amputation and a prolonged course of healing they have been packing the medial and lateral areas open with Vashe soaked gauze daily.  She is currently proceeding with prosthesis set up and has had her initial consultation hopes to be molded in the coming week  No new concerns  Assessment & Plan: Visit Diagnoses: No diagnosis found.  Plan: Continue with prosthesis set up continue current wound care follow-up in the office in 6 weeks sooner should any concerns arise  Follow-Up Instructions: Return in about 6 weeks (around 05/27/2024).   Ortho Exam  Patient is alert, oriented, no adenopathy, well-dressed, normal affect, normal respiratory effort. On examination left lower extremity the residual limb is consolidating well medially there is a 1 cm x 2 cm area in the fold of her incision which has not yet healed this is flat this does not probe there is scant surrounding maceration medially there is a 15 mm in diameter open area filled in with flat pink tissue without surrounding erythema maceration or sign of infection    Imaging: No results found. No images are attached to the encounter.  Labs: Lab Results  Component Value Date   HGBA1C 5.5 10/28/2023   HGBA1C 6.4 (H) 08/02/2022   HGBA1C 11.0 (H) 01/11/2020   ESRSEDRATE 136 (H) 11/11/2023   ESRSEDRATE 113 (H) 11/04/2023   ESRSEDRATE 136 (H) 01/14/2020   CRP 21.8 (H) 11/11/2023   CRP 7.4 (H) 11/04/2023   CRP 11.3 (H) 01/14/2020   LABURIC 7.7 (H) 02/04/2022   REPTSTATUS 11/17/2023 FINAL 11/15/2023   GRAMSTAIN  11/15/2023    ABUNDANT WBC  PRESENT,BOTH PMN AND MONONUCLEAR NO ORGANISMS SEEN    CULT  11/15/2023    RARE Normal respiratory flora-no Staph aureus or Pseudomonas seen Performed at [facility], [address], [place], [place] [address]      Lab Results  Component Value Date   ALBUMIN  3.0 (L) 04/03/2024   ALBUMIN  3.1 (L) 12/15/2023   ALBUMIN  3.2 (L) 11/30/2023    Lab Results  Component Value Date   MG 2.0 04/08/2024   MG 2.2 01/22/2024   MG 2.3 01/21/2024   Lab Results  Component Value Date   VD25OH 50.60 10/31/2023   VD25OH 42.44 10/30/2023    No results found for: PREALBUMIN    Latest Ref Rng & Units 04/08/2024    3:05 AM 04/07/2024    9:30 AM 04/03/2024    9:06 AM  CBC EXTENDED  WBC 4.0 - 10.5 K/uL 6.8   6.1   RBC 3.87 - 5.11 MIL/uL 2.96   3.41   Hemoglobin 12.0 - 15.0 g/dL 8.2  9.2  9.1   HCT 63.9 - 46.0 % 28.2  27.0  32.3   Platelets 150 - 400 K/uL 252   249      There is no height or weight on file to calculate BMI.  Orders:  No orders of the defined types were placed in this  encounter.  No orders of the defined types were placed in this encounter.    Procedures: No procedures performed  Clinical Data: No additional findings.  ROS:  All other systems negative, except as noted in the HPI. Review of Systems  Objective: Vital Signs: There were no vitals taken for this visit.  Specialty Comments:  No specialty comments available.  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   Acute on chronic systolic (congestive) heart failure (HCC) 12/15/2023   Chronic anemia 12/15/2023   CKD stage 3b, GFR 30-44 ml/min (HCC) 11/23/2023   Acute respiratory failure with hypoxia (HCC) 11/13/2023   Acute on chronic systolic CHF (congestive heart failure) (HCC) 11/12/2023   S/P BKA (below knee amputation) unilateral, left (HCC) 10/29/2023   Necrotizing fasciitis (HCC) 10/22/2023   History of  GI bleed 10/20/2023   Prolonged QT interval 10/20/2023   AVM (arteriovenous malformation) of small bowel, acquired 04/07/2023   Osteomyelitis of foot, right, acute (HCC) 12/12/2022   CAD (coronary artery disease) 11/06/2022   Severe aortic stenosis 11/06/2022   Mitral regurgitation 08/27/2022   Critical limb ischemia of both lower extremities (HCC) 06/19/2022   Type 2 diabetes mellitus with complication (HCC) 01/28/2020   Hyperlipidemia 01/28/2020   Hypertension 01/28/2020   Hypothyroidism 01/28/2020   Obesity, 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/28/2020   PVD (peripheral vascular disease)    Blister of leg 01/06/2020   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/28/2017   GERD (gastroesophageal reflux disease) 10/28/2017   Past Medical History:  Diagnosis Date   Anemia    Arthritis    CHF (congestive heart failure) (HCC)    Coronary artery disease    Diabetes mellitus without complication (HCC)    type 2   GERD (gastroesophageal reflux disease)    History of blood transfusion    Hypercholesteremia    Hypertension    Hypothyroidism    Mitral regurgitation    Myocardial infarction [facility])    Peripheral vascular disease    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 Wendel and Dr. Lucas   Severe aortic stenosis    Thyroid  disease     Family History  Problem Relation Age of Onset   Diabetes Other    CAD Other     Past Surgical History:  Procedure Laterality Date   ABDOMINAL AORTOGRAM W/LOWER EXTREMITY Bilateral 01/14/2020   Procedure: ABDOMINAL AORTOGRAM W/LOWER EXTREMITY;  Surgeon: Gretta Lonni PARAS, MD;  Location: [facility] LAB;  Service: Cardiovascular;  Laterality: Bilateral;   ABDOMINAL AORTOGRAM W/LOWER EXTREMITY N/A 07/05/2022   Procedure: ABDOMINAL AORTOGRAM W/LOWER EXTREMITY;  Surgeon: Gretta Lonni PARAS, MD;  Location: [facility];  Service: Cardiovascular;   Laterality: N/A;   ABDOMINAL AORTOGRAM W/LOWER EXTREMITY N/A 08/02/2022   Procedure: ABDOMINAL AORTOGRAM W/LOWER EXTREMITY;  Surgeon: Gretta Lonni PARAS, MD;  Location: [facility];  Service: Cardiovascular;  Laterality: N/A;   ABDOMINAL AORTOGRAM W/LOWER EXTREMITY N/A 08/06/2022   Procedure: ABDOMINAL AORTOGRAM W/LOWER EXTREMITY;  Surgeon: Gretta Lonni PARAS, MD;  Location: [facility];  Service: Cardiovascular;  Laterality: N/A;   AMPUTATION Bilateral 07/20/2022   Procedure: AMPUTATION RIGHT GREAT TOE AND SECOND TOE, AMPUTATION LEFT GREAT TOE;  Surgeon: Harden Jerona GAILS, MD;  Location: [facility] OR;  Service: Orthopedics;  Laterality: Bilateral;   AMPUTATION  Bilateral 08/08/2022   Procedure: BILATERAL TRANSMETATARSAL AMPUTATION;  Surgeon: Harden Jerona GAILS, MD;  Location: [facility] OR;  Service: Orthopedics;  Laterality: Bilateral;   AMPUTATION Left 10/22/2023   Procedure: AMPUTATION BELOW KNEE;  Surgeon: Lanis Fonda BRAVO, MD;  Location: [facility] OR;  Service: Vascular;  Laterality: Left;   AMPUTATION TOE     BIOPSY  04/06/2023   Procedure: BIOPSY;  Surgeon: Lesette Elspeth SQUIBB, MD;  Location: [facility] ENDOSCOPY;  Service: Gastroenterology;;   COLONOSCOPY WITH PROPOFOL  N/A 04/06/2023   Procedure: COLONOSCOPY WITH PROPOFOL ;  Surgeon: Evadna Elspeth SQUIBB, MD;  Location: [facility] ENDOSCOPY;  Service: Gastroenterology;  Laterality: N/A;   CYSTOSCOPY WITH STENT PLACEMENT N/A 11/30/2023   Procedure: CYSTOSCOPY, WITH FULGURATION OF BLEEDING AND CLOT EVACUATION;  Surgeon: Roseann Adine PARAS., MD;  Location: [facility] OR;  Service: Urology;  Laterality: N/A;  CLOT EVACUATION OF BLADDER   ESOPHAGOGASTRODUODENOSCOPY (EGD) WITH PROPOFOL  N/A 04/06/2023   Procedure: ESOPHAGOGASTRODUODENOSCOPY (EGD) WITH PROPOFOL ;  Surgeon: Oceanna Elspeth SQUIBB, MD;  Location: [facility] ENDOSCOPY;  Service: Gastroenterology;  Laterality: N/A;   GIVENS CAPSULE STUDY N/A 04/06/2023   Procedure: GIVENS CAPSULE STUDY;  Surgeon: Amala Elspeth SQUIBB, MD;  Location: [facility]  ENDOSCOPY;  Service: Gastroenterology;  Laterality: N/A;   PERIPHERAL VASCULAR ATHERECTOMY Right 01/14/2020   Procedure: PERIPHERAL VASCULAR ATHERECTOMY;  Surgeon: Gretta Lonni PARAS, MD;  Location: [facility] INVASIVE CV LAB;  Service: Cardiovascular;  Laterality: Right;  sfa   PERIPHERAL VASCULAR INTERVENTION Right 01/14/2020   Procedure: PERIPHERAL VASCULAR INTERVENTION;  Surgeon: Gretta Lonni PARAS, MD;  [facility]: [facility];  Service: Cardiovascular;  Laterality: Right;  sfa stent    PERIPHERAL VASCULAR INTERVENTION  07/05/2022   Procedure: PERIPHERAL VASCULAR INTERVENTION;  Surgeon: Gretta Lonni PARAS, MD;  Location: [facility];  Service: Cardiovascular;;   PERIPHERAL VASCULAR INTERVENTION  08/02/2022   Procedure: PERIPHERAL VASCULAR INTERVENTION;  Surgeon: Gretta Lonni PARAS, MD;  Location: [facility];  Service: Cardiovascular;;   PERIPHERAL VASCULAR INTERVENTION  08/06/2022   Procedure: PERIPHERAL VASCULAR INTERVENTION;  Surgeon: Gretta Lonni PARAS, MD;  Location: [facility] INVASIVE CV LAB;  Service: Cardiovascular;;   PERIPHERAL VASCULAR THROMBECTOMY  08/02/2022   Procedure: PERIPHERAL VASCULAR THROMBECTOMY;  Surgeon: Gretta Lonni PARAS, MD;  Location: [facility];  Service: Cardiovascular;;   REVISION AMPUTATION, BELOW THE KNEE Left 10/23/2023   Procedure: REVISION AMPUTATION, BELOW THE KNEE;  Surgeon: Harden Jerona GAILS, MD;  Location: [facility] OR;  Service: Orthopedics;  Laterality: Left;  REVISION LEFT BELOW KNEE AMPUTATION   RIGHT HEART CATH N/A 11/29/2023   Procedure: RIGHT HEART CATH;  Surgeon: Rolan Ezra RAMAN, MD;  Location: [facility] INVASIVE CV LAB;  Service: Cardiovascular;  Laterality: N/A;   RIGHT HEART CATH AND CORONARY ANGIOGRAPHY N/A 01/17/2024   Procedure: RIGHT HEART CATH AND CORONARY ANGIOGRAPHY;  Surgeon: Wendel Lurena POUR, MD;  Location: [facility];  Service: Cardiovascular;  Laterality: N/A;   RIGHT/LEFT HEART CATH AND CORONARY ANGIOGRAPHY N/A 08/23/2022    Procedure: RIGHT/LEFT HEART CATH AND CORONARY ANGIOGRAPHY;  Surgeon: Dann Candyce RAMAN, MD;  Location: [facility] INVASIVE CV LAB;  Service: Cardiovascular;  Laterality: N/A;   SKIN SPLIT GRAFT Right 03/18/2020   Procedure: SKIN GRAFTING RIGHT FOOT ULCER;  Surgeon: Harden Jerona GAILS, MD;  Location: [facility] OR;  Service: Orthopedics;  Laterality: Right;   STUMP REVISION Right 11/30/2022   Procedure: REVISION RIGHT TRANSMETATARSAL AMPUTATION;  Surgeon: Harden Jerona GAILS, MD;  Location: [facility] OR;  Service: Orthopedics;  Laterality: Right;   TEE WITHOUT CARDIOVERSION N/A  08/27/2022   Procedure: TRANSESOPHAGEAL ECHOCARDIOGRAM;  Surgeon: Hobart Powell BRAVO, MD;  Location: [facility] INVASIVE CV LAB;  Service: Cardiovascular;  Laterality: N/A;   TRANSCATHETER AORTIC VALVE REPLACEMENT,SUBCLAVIAN Left 04/07/2024   Procedure: Transcatheter Aortic Valve Replacement-Subclavian;  Surgeon: Wendel Lurena POUR, MD;  Location: [facility];  Service: Cardiovascular;  Laterality: Left;   TRANSESOPHAGEAL ECHOCARDIOGRAM (CATH LAB) N/A 10/24/2023   Procedure: TRANSESOPHAGEAL ECHOCARDIOGRAM;  Surgeon: Michele Richardson, DO;  Location: [facility];  Service: Cardiovascular;  Laterality: N/A;   TRANSESOPHAGEAL ECHOCARDIOGRAM (CATH LAB) N/A 04/07/2024   Procedure: TRANSESOPHAGEAL ECHOCARDIOGRAM;  Surgeon: Wendel Lurena POUR, MD;  Location: [facility];  Service: Cardiovascular;  Laterality: N/A;   WISDOM TOOTH EXTRACTION     Social History   Occupational History   Not on file  Tobacco Use   Smoking status: Former    Current packs/day: 0.00    Types: Cigarettes    Quit date: 01/2017    Years since quitting: 7.2   Smokeless tobacco: Never  Vaping Use   Vaping status: Never Used  Substance and Sexual Activity   Alcohol use: Not Currently   Drug use: Yes    Types: Marijuana    Comment: on ocassion- Last time - 11/13/22   Sexual activity: Yes    Birth control/protection: Post-menopausal

## 2024-04-16 ENCOUNTER — Ambulatory Visit: Admitting: Internal Medicine

## 2024-04-16 ENCOUNTER — Ambulatory Visit: Admitting: Physician Assistant

## 2024-04-17 NOTE — Progress Notes (Unsigned)
 " [facility]   MULTIDISCIPLINARY HEART VALVE CLINIC                                     Cardiology Office Note:    Date:  04/17/2024   ID:  Kayla Schmitt, DOB 11-Sep-1965, MRN [id number]  PCP:  [name], NP  [facility] Cardiologist:  Diannah SHAUNNA Maywood, MD  [facility] Structural heart: Lurena MARLA Red, MD [facility] Electrophysiologist:  None   Referring MD: [name], NP   TOC s/p TAVR  History of Present Illness:    Kayla Schmitt is a 58 y.o. 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   Admitted 07/2022 with NSTEMI. Cath showed 3V CAD and significant MR. CABG and MVR recommended, but surgery turned down due to multiple medical issues. Admitted 09/2023 for MRSA bacteremia and necrotizing fasciitis. Required L BKA followed by revision. TEE 09/2023 with EF 60-65%, mild to moderate MR and moderate to severe AS, but no evidence of endocarditis. Four week course of IV abx. Discharged 10/29/23 to [facility] for rehab. Transferred back to [facility] 7/25 with a/c HF, and concern for low-output with acute respiratory failure. She was diuresed with IV lasix . She required intubation for flash pulmonary edema, and started on inotropic support DBA. Renal function continued to decline and she transiently required CRRT. Eventually underwent RHC showing severely elevated PCWP, mildly elevated RAP, preserved CO. Course complicated by hematuria, requiring PRBCs. She went to OR for cystoscopy and declotting of bladder. She was able to be extubated, drips weaned and GDMT titrated, however limited by soft BP and creatinine. R/LHC 01/17/24 demonstrated multivessel  disease relatively unchanged versus prior study with occluded left circumflex, high-grade mid LAD, and moderate right coronary artery disease. RHC RA 8, PAP 53/29 (40), PCWP 29 w/ v waves to 44 mmHg, CO 6.7 L/min and Fick cardiac index of 3.6 L/min/m, PVR 1.6 WU, PAPi 3. Admitted 12/2023 with A/C HFrEF. Diuresed well with high doses of IV lasix . Received 1uPRBC for IDA with known AMVs. Asymptomatic at discharge despite elevated ReDs (chronically elevated). Discharged with 3 doses of Furoscix  and higher PO diuretic dose. S/p TAVR with a 26 mm Edwards Sapien 3 Ultra Resilia THV via the left subclavian approach on 04/07/24. Post operative echo showed EF 25%, mild cLVH, mod pulm HTN, moderate MAC with mod MR, mod PR, normally functioning TAVR with a mean gradient of 8 mmHg and no PVL. LVEDP 28 mm hg at the time of TAVR and diuresed with IV lasix .   Today the patient presents to clinic for follow up.      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 Wendel and Dr. Lucas   Severe aortic stenosis    Thyroid  disease      Current Medications: Active Medications[1]    ROS:   Please see the history of present illness.    All other systems reviewed and are negative.  EKGs       Risk Assessment/Calculations:   {Does this patient have ATRIAL FIBRILLATION?[phone]}   {This patient may be at risk for Amyloid. She has one or more dx on the prob list or PMH from the  following list -  Abnormal EKG, HFpEF/Diastolic CHF, Aortic Stenosis, LVH, Bilateral Carpal Tunnel Syndrome, Biceps Tendon Rupture, Spinal Stenosis, Pericardial Effusion, Left Atrial Enlargement, Conduction System Disorder. See list below or review PMH.  Diagnoses From Problem List           Noted     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/28/2017     Prolonged QT interval 10/20/2023     Severe aortic stenosis 11/06/2022    Click HERE to open Cardiac Amyloid Screening SmartSet to order screening OR Click HERE to defer testing for 1 year or permanently :1}    Physical Exam:    VS:  There were no vitals taken for this visit.    Wt Readings from Last 3 Encounters:  04/08/24 216 lb 7.9 oz (98.2 kg)  03/24/24 189 lb (85.7 kg)  03/18/24 189 lb (85.7 kg)     GEN: Well nourished, well developed in no acute distress NECK: No JVD CARDIAC: ***RRR, no murmurs, rubs, gallops RESPIRATORY:  Clear to auscultation without rales, wheezing or rhonchi  ABDOMEN: Soft, non-tender, non-distended EXTREMITIES:  No edema; No deformity.  Groin sites clear without hematoma or ecchymosis. ****  ASSESSMENT:    1. S/P TAVR (transcatheter aortic valve replacement)   2. Chronic systolic CHF (congestive heart failure) (HCC)   3. Coronary artery disease involving native coronary artery of native heart without angina pectoris   4. PAD (peripheral artery disease)   5. CKD stage 3b, GFR 30-44 ml/min (HCC)   6. PVC's (premature ventricular contractions)   7. Chronic anemia     PLAN:    In order of problems listed above:  Severe AS s/p TAVR:  -- Pt doing *** s/p TAVR.  -- ECG with no HAVB.  -- Groin sites healing well.  -- SBE***.  -- Continue Aspirin  81mg  daily and Plavix  75mg  daily. -- Cleared to resume all activities without restriction. -- I will see back for 1 month echo and OV.  Chronic systolic CHF: -- Appears ***.  -- GDMT limited by CKD and need for midodrine  -- Continue metolazone  2.5 mg +  extra 40 KCL every Sunday. -- Continue midodrine  2.5 mg bid. -- Not on SGLT2i with hx UTI.    CAD: -- NSTEMI in 07/2022. Multivessel CAD. CABG had been recommended but managed medically due to other active medical issues. -- LHC 01/17/24 demonstrated multivessel disease relatively unchanged versus prior study with occluded left circumflex, high-grade mid LAD, and moderate right coronary artery disease. -- No chest pain. -- Continue DAPT with ASA + Plavix . -- Continue atorvastatin  40 mg daily.   PAD: -- Hx SFA stents. -- Previous necrotizing fasciitis. -- S/p L BKA 09/2023. -- S/p R TMA in 07/2022. -- Followed by Dr. Gretta and will require aortogram, lower  extremity arteriogram with a focus on the right leg for her in-stent stenosis versus short focal occlusion (scheduled 1/15). -- On aspirin /plavix  + atorvastatin  40 mg daily.    CKD IIIb: -- Baseline SCr 1.76-2.31. -- No SGLT2i as above. -- Follows with nephrology.   PVCs: -- Continue mexiletine 150 mg bid.   Chronic Anemia: -- Hx GI bleed from AVMs. ? Heyde syndrome. -- Required transfusions 10/2023 and 12/2023. -- Hg baseline ~8-9.  -- Hg 8.2 at discharge.     Medication Adjustments/Labs and Tests Ordered: Current medicines are reviewed at length with the patient today.  Concerns regarding medicines are outlined above.  No orders of the defined types were placed in this encounter.  No orders of the defined types were placed in this encounter.   There are no Patient Instructions on file for this visit.   Signed, Lamarr Hummer, PA-C  04/17/2024 11:19 AM    [facility]    [1]  No outpatient medications have been marked as taking for the 04/20/24 encounter (Appointment) with Hummer Lamarr SAUNDERS, PA-C.   "

## 2024-04-18 MED FILL — Torsemide Tab 100 MG: ORAL | 30 days supply | Qty: 60 | Fill #1 | Status: AC

## 2024-04-20 ENCOUNTER — Ambulatory Visit: Attending: Physician Assistant | Admitting: Physician Assistant

## 2024-04-20 VITALS — BP 128/80 | HR 85 | Ht 64.0 in | Wt 204.0 lb

## 2024-04-20 DIAGNOSIS — I5022 Chronic systolic (congestive) heart failure: Secondary | ICD-10-CM

## 2024-04-20 DIAGNOSIS — N1832 Chronic kidney disease, stage 3b: Secondary | ICD-10-CM | POA: Diagnosis not present

## 2024-04-20 DIAGNOSIS — Z952 Presence of prosthetic heart valve: Secondary | ICD-10-CM

## 2024-04-20 DIAGNOSIS — D649 Anemia, unspecified: Secondary | ICD-10-CM

## 2024-04-20 DIAGNOSIS — I493 Ventricular premature depolarization: Secondary | ICD-10-CM | POA: Diagnosis not present

## 2024-04-20 DIAGNOSIS — I2510 Atherosclerotic heart disease of native coronary artery without angina pectoris: Secondary | ICD-10-CM

## 2024-04-20 DIAGNOSIS — I739 Peripheral vascular disease, unspecified: Secondary | ICD-10-CM | POA: Diagnosis not present

## 2024-04-20 MED ORDER — METOPROLOL SUCCINATE ER 25 MG PO TB24: 25.0000 mg | ORAL_TABLET | Freq: Every day | ORAL | 3 refills | Status: AC

## 2024-04-20 MED ORDER — AMOXICILLIN 500 MG PO TABS: 2000.0000 mg | ORAL_TABLET | ORAL | 12 refills | Status: AC

## 2024-04-20 NOTE — Patient Instructions (Signed)
 Medication Instructions:  Your physician has recommended you make the following change in your medication:  START Amoxicillin  500 mg, take 4 tablets by mouth 1 hour prior to dental procedures and cleanings.  START metoprolol  succinate 25mg , taking 1 tablet daily. *If you need a refill on your cardiac medications before your next appointment, please call your pharmacy*  Lab Work: None needed If you have labs (blood work) drawn today and your tests are completely normal, you will receive your results only by: MyChart Message (if you have MyChart) OR A paper copy in the mail If you have any lab test that is abnormal or we need to change your treatment, we will call you to review the results.  Testing/Procedures: 05/28/24 Your physician has requested that you have an echocardiogram. Echocardiography is a painless test that uses sound waves to create images of your heart. It provides your doctor with information about the size and shape of your heart and how well your hearts chambers and valves are working. This procedure takes approximately one hour. There are no restrictions for this procedure. Please do NOT wear cologne, perfume, aftershave, or lotions (deodorant is allowed). Please arrive 15 minutes prior to your appointment time.  Please note: We ask at that you not bring children with you during ultrasound (echo/ vascular) testing. Due to room size and safety concerns, children are not allowed in the ultrasound rooms during exams. Our front office staff cannot provide observation of children in our lobby area while testing is being conducted. An adult accompanying a patient to their appointment will only be allowed in the ultrasound room at the discretion of the ultrasound technician under special circumstances. We apologize for any inconvenience.   Follow-Up: At [facility], you and your health needs are our priority.  As part of our continuing mission to provide you with exceptional  heart care, our providers are all part of one team.  This team includes your primary Cardiologist (physician) and Advanced Practice Providers or APPs (Physician Assistants and Nurse Practitioners) who all work together to provide you with the care you need, when you need it.  Your next appointment:   As scheduled on 05/28/24  Provider:   Dr. Bensimhon  We recommend signing up for the patient portal called MyChart.  Sign up information is provided on this After Visit Summary.  MyChart is used to connect with patients for Virtual Visits (Telemedicine).  Patients are able to view lab/test results, encounter notes, upcoming appointments, etc.  Non-urgent messages can be sent to your provider as well.   To learn more about what you can do with MyChart, go to [email or web address].

## 2024-05-01 ENCOUNTER — Other Ambulatory Visit (HOSPITAL_COMMUNITY): Payer: Self-pay

## 2024-05-02 MED FILL — Torsemide Tab 100 MG: ORAL | 30 days supply | Qty: 60 | Fill #1 | Status: AC

## 2024-05-14 ENCOUNTER — Ambulatory Visit (HOSPITAL_COMMUNITY)
Admission: RE | Admit: 2024-05-14 | Discharge: 2024-05-14 | Disposition: A | Discharge status: Home / Self Care | Source: Ambulatory Visit | Attending: Vascular Surgery | Admitting: Vascular Surgery

## 2024-05-14 ENCOUNTER — Encounter (HOSPITAL_COMMUNITY)
Admission: RE | Disposition: A | Discharge status: Home / Self Care | Payer: Self-pay | Source: Ambulatory Visit | Attending: Vascular Surgery

## 2024-05-14 ENCOUNTER — Other Ambulatory Visit: Payer: Self-pay

## 2024-05-14 ENCOUNTER — Encounter (HOSPITAL_COMMUNITY): Payer: Self-pay | Admitting: Vascular Surgery

## 2024-05-14 DIAGNOSIS — I70221 Atherosclerosis of native arteries of extremities with rest pain, right leg: Secondary | ICD-10-CM

## 2024-05-14 DIAGNOSIS — E1122 Type 2 diabetes mellitus with diabetic chronic kidney disease: Secondary | ICD-10-CM | POA: Insufficient documentation

## 2024-05-14 DIAGNOSIS — Z87891 Personal history of nicotine dependence: Secondary | ICD-10-CM | POA: Diagnosis not present

## 2024-05-14 DIAGNOSIS — E1151 Type 2 diabetes mellitus with diabetic peripheral angiopathy without gangrene: Secondary | ICD-10-CM | POA: Insufficient documentation

## 2024-05-14 DIAGNOSIS — Z95828 Presence of other vascular implants and grafts: Secondary | ICD-10-CM | POA: Insufficient documentation

## 2024-05-14 DIAGNOSIS — I70223 Atherosclerosis of native arteries of extremities with rest pain, bilateral legs: Secondary | ICD-10-CM

## 2024-05-14 DIAGNOSIS — N189 Chronic kidney disease, unspecified: Secondary | ICD-10-CM | POA: Diagnosis not present

## 2024-05-14 DIAGNOSIS — T82856A Stenosis of peripheral vascular stent, initial encounter: Secondary | ICD-10-CM | POA: Diagnosis not present

## 2024-05-14 DIAGNOSIS — I130 Hypertensive heart and chronic kidney disease with heart failure and stage 1 through stage 4 chronic kidney disease, or unspecified chronic kidney disease: Secondary | ICD-10-CM | POA: Diagnosis not present

## 2024-05-14 DIAGNOSIS — Z89512 Acquired absence of left leg below knee: Secondary | ICD-10-CM | POA: Diagnosis not present

## 2024-05-14 DIAGNOSIS — Z794 Long term (current) use of insulin: Secondary | ICD-10-CM | POA: Insufficient documentation

## 2024-05-14 DIAGNOSIS — I70201 Unspecified atherosclerosis of native arteries of extremities, right leg: Secondary | ICD-10-CM | POA: Insufficient documentation

## 2024-05-14 HISTORY — PX: LOWER EXTREMITY INTERVENTION: CATH118252

## 2024-05-14 HISTORY — PX: ABDOMINAL AORTOGRAM: CATH118222

## 2024-05-14 HISTORY — PX: LOWER EXTREMITY ANGIOGRAPHY: CATH118251

## 2024-05-14 LAB — POCT I-STAT, CHEM 8
BUN: 66 mg/dL — ABNORMAL HIGH (ref 6–20)
Calcium, Ion: 1.2 mmol/L (ref 1.15–1.40)
Chloride: 101 mmol/L (ref 98–111)
Creatinine, Ser: 2.1 mg/dL — ABNORMAL HIGH (ref 0.44–1.00)
Glucose, Bld: 147 mg/dL — ABNORMAL HIGH (ref 70–99)
HCT: 32 % — ABNORMAL LOW (ref 36.0–46.0)
Hemoglobin: 10.9 g/dL — ABNORMAL LOW (ref 12.0–15.0)
Potassium: 3.6 mmol/L (ref 3.5–5.1)
Sodium: 141 mmol/L (ref 135–145)
TCO2: 28 mmol/L (ref 22–32)

## 2024-05-14 MED ORDER — LIDOCAINE HCL (PF) 1 % IJ SOLN
INTRAMUSCULAR | Status: AC
  Filled 2024-05-14: qty 30

## 2024-05-14 MED ORDER — SODIUM CHLORIDE 0.9% FLUSH: 3.0000 mL | INTRAVENOUS | Status: DC | PRN

## 2024-05-14 MED ORDER — FENTANYL CITRATE (PF) 100 MCG/2ML IJ SOLN
INTRAMUSCULAR | Status: DC | PRN
  Administered 2024-05-14: 25 ug via INTRAVENOUS
  Administered 2024-05-14: 50 ug via INTRAVENOUS

## 2024-05-14 MED ORDER — FENTANYL CITRATE (PF) 100 MCG/2ML IJ SOLN
INTRAMUSCULAR | Status: AC
  Filled 2024-05-14: qty 2

## 2024-05-14 MED ORDER — SODIUM CHLORIDE 0.9 % IV SOLN: 250.0000 mL | INTRAVENOUS | Status: DC | PRN

## 2024-05-14 MED ORDER — HEPARIN (PORCINE) IN NACL 1000-0.9 UT/500ML-% IV SOLN
INTRAVENOUS | Status: DC | PRN
  Administered 2024-05-14: 1000 mL

## 2024-05-14 MED ORDER — MIDAZOLAM HCL 2 MG/2ML IJ SOLN
INTRAMUSCULAR | Status: AC
  Filled 2024-05-14: qty 2

## 2024-05-14 MED ORDER — LIDOCAINE HCL (PF) 1 % IJ SOLN
INTRAMUSCULAR | Status: DC | PRN
  Administered 2024-05-14: 5 mL

## 2024-05-14 MED ORDER — IODIXANOL 320 MG/ML IV SOLN
INTRAVENOUS | Status: DC | PRN
  Administered 2024-05-14: 8 mL

## 2024-05-14 MED ORDER — SODIUM CHLORIDE 0.9 % IV SOLN: INTRAVENOUS | Status: AC

## 2024-05-14 MED ORDER — ACETAMINOPHEN 325 MG PO TABS: 650.0000 mg | ORAL_TABLET | ORAL | Status: DC | PRN

## 2024-05-14 MED ORDER — OXYCODONE HCL 5 MG PO TABS: 5.0000 mg | ORAL_TABLET | ORAL | Status: DC | PRN

## 2024-05-14 MED ORDER — SODIUM CHLORIDE 0.9 % IV SOLN: INTRAVENOUS | Status: DC

## 2024-05-14 MED ORDER — LABETALOL HCL 5 MG/ML IV SOLN: 10.0000 mg | INTRAVENOUS | Status: DC | PRN

## 2024-05-14 MED ORDER — SODIUM CHLORIDE 0.9% FLUSH: 3.0000 mL | Freq: Two times a day (BID) | INTRAVENOUS | Status: DC

## 2024-05-14 MED ORDER — MIDAZOLAM HCL (PF) 2 MG/2ML IJ SOLN
INTRAMUSCULAR | Status: DC | PRN
  Administered 2024-05-14 (×2): 1 mg via INTRAVENOUS

## 2024-05-14 MED ORDER — HYDRALAZINE HCL 20 MG/ML IJ SOLN: 5.0000 mg | INTRAMUSCULAR | Status: DC | PRN

## 2024-05-14 NOTE — H&P (Signed)
 "   Patient name: Kayla Schmitt            MRN: [id number]        DOB: 03/17/1966            Sex: female   REASON FOR VISIT: 3 month follow-up PAD   HPI: Kayla Schmitt is a 59 y.o. female with history of hypertension, hyperlipidemia, diabetes, CKD, ischemic cardiomyopathy, severe aortic stenosis that presents for 3 month follow-up of PAD.     Previously underwent bilateral lower extremity SFA pop stenting for CLI.  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  She states her stump is healing.  We are following her right SFA popliteal stents.  These last underwent laser arthrectomy with DCB on 08/06/2022 for in-stent stenosis.           Past Medical History:  Diagnosis Date   Anemia     Aortic stenosis     Arthritis     CHF (congestive heart failure) (HCC)     Coronary artery disease     Diabetes mellitus without complication (HCC)      type 2   GERD (gastroesophageal reflux disease)     Heart murmur     History of blood transfusion     Hypercholesteremia     Hypertension     Hypothyroidism     Mitral regurgitation     Myocardial infarction [facility])     Peripheral vascular disease     PONV (postoperative nausea and vomiting)     Thyroid  disease                 Past Surgical History:  Procedure Laterality Date   ABDOMINAL AORTOGRAM W/LOWER EXTREMITY Bilateral 01/14/2020    Procedure: ABDOMINAL AORTOGRAM W/LOWER EXTREMITY;  Surgeon: Gretta Lonni PARAS, MD;  Location: [facility] INVASIVE CV LAB;  Service: Cardiovascular;  Laterality: Bilateral;   ABDOMINAL AORTOGRAM W/LOWER EXTREMITY N/A 07/05/2022    Procedure: ABDOMINAL AORTOGRAM W/LOWER EXTREMITY;  Surgeon: Gretta Lonni PARAS, MD;  Location: [facility] INVASIVE CV LAB;  Service: Cardiovascular;  Laterality: N/A;   ABDOMINAL AORTOGRAM W/LOWER EXTREMITY N/A 08/02/2022    Procedure: ABDOMINAL AORTOGRAM W/LOWER EXTREMITY;  Surgeon: Gretta Lonni PARAS, MD;  Location: [facility]  INVASIVE CV LAB;  Service: Cardiovascular;  Laterality: N/A;   ABDOMINAL AORTOGRAM W/LOWER EXTREMITY N/A 08/06/2022    Procedure: ABDOMINAL AORTOGRAM W/LOWER EXTREMITY;  Surgeon: Gretta Lonni PARAS, MD;  Location: [facility] INVASIVE CV LAB;  Service: Cardiovascular;  Laterality: N/A;   AMPUTATION Bilateral 07/20/2022    Procedure: AMPUTATION RIGHT GREAT TOE AND SECOND TOE, AMPUTATION LEFT GREAT TOE;  Surgeon: Harden Jerona GAILS, MD;  Location: [facility] OR;  Service: Orthopedics;  Laterality: Bilateral;   AMPUTATION Bilateral 08/08/2022    Procedure: BILATERAL TRANSMETATARSAL AMPUTATION;  Surgeon: Harden Jerona GAILS, MD;  Location: [facility] OR;  Service: Orthopedics;  Laterality: Bilateral;   AMPUTATION Left 10/22/2023    Procedure: AMPUTATION BELOW KNEE;  Surgeon: Lanis Fonda BRAVO, MD;  Location: [facility] OR;  Service: Vascular;  Laterality: Left;   AMPUTATION TOE       BIOPSY   04/06/2023    Procedure: BIOPSY;  Surgeon: Pinki Elspeth SQUIBB, MD;  Location: [facility] ENDOSCOPY;  Service: Gastroenterology;;   COLONOSCOPY WITH PROPOFOL  N/A 04/06/2023    Procedure: COLONOSCOPY WITH PROPOFOL ;  Surgeon: Annaliz Elspeth SQUIBB, MD;  Location: [facility] ENDOSCOPY;  Service: Gastroenterology;  Laterality: N/A;   CYSTOSCOPY WITH STENT PLACEMENT N/A 11/30/2023    Procedure:  CYSTOSCOPY, WITH FULGURATION OF BLEEDING AND CLOT EVACUATION;  Surgeon: Roseann Adine PARAS., MD;  Location: [facility] OR;  Service: Urology;  Laterality: N/A;  CLOT EVACUATION OF BLADDER   ESOPHAGOGASTRODUODENOSCOPY (EGD) WITH PROPOFOL  N/A 04/06/2023    Procedure: ESOPHAGOGASTRODUODENOSCOPY (EGD) WITH PROPOFOL ;  Surgeon: Marshal Elspeth SQUIBB, MD;  Location: [facility] ENDOSCOPY;  Service: Gastroenterology;  Laterality: N/A;   GIVENS CAPSULE STUDY N/A 04/06/2023    Procedure: GIVENS CAPSULE STUDY;  Surgeon: Yanira Elspeth SQUIBB, MD;  Location: [facility] ENDOSCOPY;  Service: Gastroenterology;  Laterality: N/A;   PERIPHERAL VASCULAR ATHERECTOMY Right 01/14/2020    Procedure: PERIPHERAL VASCULAR ATHERECTOMY;   Surgeon: Gretta Lonni PARAS, MD;  Location: [facility] INVASIVE CV LAB;  Service: Cardiovascular;  Laterality: Right;  sfa   PERIPHERAL VASCULAR INTERVENTION Right 01/14/2020    Procedure: PERIPHERAL VASCULAR INTERVENTION;  Surgeon: Gretta Lonni PARAS, MD;  [facility]: [facility];  Service: Cardiovascular;  Laterality: Right;  sfa stent    PERIPHERAL VASCULAR INTERVENTION   07/05/2022    Procedure: PERIPHERAL VASCULAR INTERVENTION;  Surgeon: Gretta Lonni PARAS, MD;  [facility];  Service: Cardiovascular;;   PERIPHERAL VASCULAR INTERVENTION   08/02/2022    Procedure: PERIPHERAL VASCULAR INTERVENTION;  Surgeon: Gretta Lonni PARAS, MD;  [facility]: [facility];  Service: Cardiovascular;;   PERIPHERAL VASCULAR INTERVENTION   08/06/2022    Procedure: PERIPHERAL VASCULAR INTERVENTION;  Surgeon: Gretta Lonni PARAS, MD;  Location: [facility] INVASIVE CV LAB;  Service: Cardiovascular;;   PERIPHERAL VASCULAR THROMBECTOMY   08/02/2022    Procedure: PERIPHERAL VASCULAR THROMBECTOMY;  Surgeon: Gretta Lonni PARAS, MD;  [facility];  Service: Cardiovascular;;   REVISION AMPUTATION, BELOW THE KNEE Left 10/23/2023    Procedure: REVISION AMPUTATION, BELOW THE KNEE;  Surgeon: Harden Jerona GAILS, MD;  Location: [facility] OR;  Service: Orthopedics;  Laterality: Left;  REVISION LEFT BELOW KNEE AMPUTATION   RIGHT HEART CATH N/A 11/29/2023    Procedure: RIGHT HEART CATH;  Surgeon: Rolan Ezra RAMAN, MD;  Location: [facility] INVASIVE CV LAB;  Service: Cardiovascular;  Laterality: N/A;   RIGHT HEART CATH AND CORONARY ANGIOGRAPHY N/A 01/17/2024    Procedure: RIGHT HEART CATH AND CORONARY ANGIOGRAPHY;  Surgeon: Wendel Lurena POUR, MD;  Location: [facility] INVASIVE CV LAB;  Service: Cardiovascular;  Laterality: N/A;   RIGHT/LEFT HEART CATH AND CORONARY ANGIOGRAPHY N/A 08/23/2022    Procedure: RIGHT/LEFT HEART CATH AND CORONARY ANGIOGRAPHY;  Surgeon: Dann Candyce RAMAN, MD;  Location: [facility] INVASIVE CV LAB;  Service:  Cardiovascular;  Laterality: N/A;   SKIN SPLIT GRAFT Right 03/18/2020    Procedure: SKIN GRAFTING RIGHT FOOT ULCER;  Surgeon: Harden Jerona GAILS, MD;  Location: [facility] OR;  Service: Orthopedics;  Laterality: Right;   STUMP REVISION Right 11/30/2022    Procedure: REVISION RIGHT TRANSMETATARSAL AMPUTATION;  Surgeon: Harden Jerona GAILS, MD;  Location: [facility] OR;  Service: Orthopedics;  Laterality: Right;   TEE WITHOUT CARDIOVERSION N/A 08/27/2022    Procedure: TRANSESOPHAGEAL ECHOCARDIOGRAM;  Surgeon: Hobart Powell BRAVO, MD;  Location: [facility] INVASIVE CV LAB;  Service: Cardiovascular;  Laterality: N/A;   TRANSESOPHAGEAL ECHOCARDIOGRAM (CATH LAB) N/A 10/24/2023    Procedure: TRANSESOPHAGEAL ECHOCARDIOGRAM;  Surgeon: Michele Richardson, DO;  [facility]: [facility] INV[facility] CV LAB;  Service: Cardiovascular;  Laterality: N/A;   WISDOM TOOTH EXTRACTION                   Family History  Problem Relation Age of Onset   Diabetes Other     CAD Other  SOCIAL HISTORY: Social History         Tobacco Use   Smoking status: Former      Current packs/day: 0.00      Types: Cigarettes      Quit date: 01/2017      Years since quitting: 7.1   Smokeless tobacco: Never  Substance Use Topics   Alcohol use: Not Currently      Allergies      Allergies  Allergen Reactions   Trental  [Pentoxifylline ] Nausea And Vomiting   Vibramycin [Doxycycline] Nausea And Vomiting              Current Outpatient Medications  Medication Sig Dispense Refill   aspirin  EC 81 MG tablet Take 1 tablet (81 mg total) by mouth daily. 30 tablet 3   atorvastatin  (LIPITOR) 40 MG tablet Take 40 mg by mouth daily.       clopidogrel  (PLAVIX ) 75 MG tablet Take 1 tablet (75 mg total) by mouth daily. 90 tablet 3   diphenhydrAMINE  HCl, Sleep, (ZZZQUIL) 25 MG CAPS Take 25 mg by mouth at bedtime.       ferrous sulfate  325 (65 FE) MG EC tablet Take 1 tablet (325 mg total) by mouth in the morning and at bedtime. 60 tablet 0   Furosemide  (FUROSCIX ) 80 MG/10ML  CTKT Inject 80 mg into the skin daily as needed. ONLY AS DIRECT BY THE [facility] 2 each 1   gabapentin  (NEURONTIN ) 300 MG capsule Take 300 mg by mouth at bedtime.       insulin  degludec (TRESIBA  FLEXTOUCH) 100 UNIT/ML FlexTouch Pen Inject 6 Units into the skin at bedtime.       levothyroxine  (SYNTHROID ) 150 MCG tablet Take 150 mcg by mouth daily before breakfast.       metolazone  (ZAROXOLYN ) 2.5 MG tablet Take 1 tablet (2.5 mg total) by mouth once a week. Every Tuesday. Take an extra 40 mEq of Potassium with Metolazone  (Patient taking differently: Take 2.5 mg by mouth once a week. Every Sunday. Take an extra 40 mEq of Potassium with Metolazone ) 20 tablet 3   mexiletine (MEXITIL ) 150 MG capsule Take 1 capsule (150 mg total) by mouth 2 (two) times daily. 60 capsule 3   midodrine  (PROAMATINE ) 2.5 MG tablet Take 1 tablet (2.5 mg total) by mouth 2 (two) times daily with a meal. 60 tablet 3   nitroGLYCERIN  (NITROSTAT ) 0.4 MG SL tablet Place 1 tablet (0.4 mg total) under the tongue every 5 (five) minutes x 3 doses as needed for chest pain (if no relief after 3rd dose proceed to ED or call 911). 11/06/2022-New 25 tablet 3   OXYGEN  Inhale 2 L into the lungs continuous.       pantoprazole  (PROTONIX ) 40 MG tablet Take 40 mg by mouth daily.       polyethylene glycol (MIRALAX  / GLYCOLAX ) 17 g packet Take 17 g by mouth 2 (two) times daily.       potassium chloride  (KLOR-CON  M) 20 MEQ tablet Take 60 meq in am and 40 meq in pm (Patient taking differently: 40 mEq 2 (two) times daily.) 150 tablet 11   promethazine  (PHENERGAN ) 25 MG tablet Take 1 tablet (25 mg total) by mouth every 6 (six) hours as needed for nausea or vomiting. 20 tablet 0   torsemide  (DEMADEX ) 100 MG tablet Take 1 tablet (100 mg total) by mouth 2 (two) times daily. 60 tablet 1      No current facility-administered medications for this visit.  REVIEW OF SYSTEMS:  [X]  denotes positive finding, [ ]  denotes negative  finding Cardiac   Comments:  Chest pain or chest pressure:      Shortness of breath upon exertion:      Short of breath when lying flat:      Irregular heart rhythm:             Vascular      Pain in calf, thigh, or hip brought on by ambulation:      Pain in feet at night that wakes you up from your sleep:       Blood clot in your veins:      Leg swelling:              Pulmonary      Oxygen  at home:      Productive cough:       Wheezing:              Neurologic      Sudden weakness in arms or legs:       Sudden numbness in arms or legs:       Sudden onset of difficulty speaking or slurred speech:      Temporary loss of vision in one eye:       Problems with dizziness:              Gastrointestinal      Blood in stool:       Vomited blood:              Genitourinary      Burning when urinating:       Blood in urine:             Psychiatric      Major depression:              Hematologic      Bleeding problems:      Problems with blood clotting too easily:             Skin      Rashes or ulcers:             Constitutional      Fever or chills:          PHYSICAL EXAM:    Vitals:    03/24/24 1549  BP: 105/65  Pulse: 90  Resp: 18  Temp: 98.1 F (36.7 C)  TempSrc: Temporal  SpO2: 92%  Weight: 189 lb (85.7 kg)  Height: 5' 4 (1.626 m)        GENERAL: The patient is a well-nourished female, in no acute distress. The vital signs are documented above. CARDIAC: There is a regular rate and rhythm.  VASCULAR:  Bilateral femoral pulses palpable Right TMA healed except small sinus on lateral margin Left BKA     DATA:    ABIs today are [place] on the right   Lower extremity arterial duplex shows patent possible short segment right SFA occlusion with monophasic flow   Assessment/Plan:   60 y.o. female with history of hypertension, hyperlipidemia, diabetes, CKD, ischemic cardiomyopathy, severe aortic stenosis that presents for 3 month follow-up of PAD.      Previously underwent bilateral lower extremity SFA pop stenting for CLI.  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  We are following her right SFA popliteal stents.  These last underwent laser arthrectomy with DCB on 08/06/2022. Concerned on duplex today that she has a  short segment occlusion in the SFA on the right with monophasic flow.  Discussed I would recommend aortogram, lower extremity arteriogram with a focus on the right leg for her in-stent stenosis versus short focal occlusion.  TMA continues looks viable although does have a small sinus in the TMA.  Want to make sure she is optimized.      Lonni DOROTHA Gaskins, MD [facility] Office: [phone]         "

## 2024-05-14 NOTE — Progress Notes (Signed)
 Client up to bathroom and tolerated well; left groin stable, no bleeding or hematoma

## 2024-05-14 NOTE — Op Note (Signed)
 "   Patient name: Kayla Schmitt MRN: [id number] DOB: [birth year] Sex: female  05/14/2024 Pre-operative Diagnosis: Occluded right SFA stents in setting of prior critical limb ischemia Post-operative diagnosis:  Same Surgeon:  Lonni DOROTHA Gaskins, MD Procedure Performed: 1.  Ultrasound-guided access left common femoral artery 2.  Aortogram with catheter selection of aorta using CO2 3.  Right lower extremity arteriogram with catheter selection of the SFA 4.  Unsuccessful antegrade crossing of right SFA stent occlusion 5.  Pullback pressure right external iliac artery 6.  46 minutes of monitored moderate conscious sedation time  Indications: Patient is a 59 year old female well-known to vascular surgery having previously undergone bilateral lower extremity interventions for critical limb ischemia with tissue loss.  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  Has a right TMA that has healed but we have been following her right SFA stents that now have evidence of distal occlusion.  She presents for right lower extremity angiogram with possible invention after risks benefits discussed.  Findings:   Ultrasound-guided access left common femoral artery.  CO2 aortogram after catheter selection of aorta showed patent infrarenal aorta with patent bilateral iliacs although these are diseased.  Difficult to visualize with bowel gas and we use limited contrasted run that showed some disease in the right external iliac around 50%.  Right lower extremity runoff then showed a patent but diseased common femoral and profunda.  There is a patent and diseased proximal SFA with high-grade stenosis.  The SFA above-knee popliteal stents are occluded.  She reconstitutes above-knee popliteal artery and has a healthier below-knee popliteal artery with three-vessel runoff.  I did get up and over the aortic bifurcation and got into the right SFA and tried to come through  the SFA stents antegrade to see if this was amendable to the lysis but these were chronically occluded and I was subintimal.  Finally I did a pullback pressure in the right external iliac with a 5 French straight cath with no significant pressure gradient.   Procedure:  The patient was identified in the holding area and taken to room 8.  The patient was then placed supine on the table and prepped and draped in the usual sterile fashion.  A time out was called.  The patient received versed  and fentanyl  for conscious moderate sedation.  Vital signs were monitored including HR, RR, oxygenation, and BP.  Ultrasound was used to evaluate the left common femoral artery.  It was patent .  A digital ultrasound image was acquired.  A micropuncture needle was used to access the left common femoral artery under ultrasound guidance.  An 018 wire was advanced without resistance and a micropuncture sheath was placed.  The 018 wire was removed and a benson wire was placed.  The micropuncture sheath was exchanged for a 5 french sheath.  An omniflush catheter was advanced over the wire to the level of L-1.  An abdominal angiogram was obtained.  Next, using the omniflush catheter and a benson wire, the aortic bifurcation was crossed and the catheter was placed into theright external iliac artery and right runoff was obtained.  Pertinent findings are noted above.  We noted that her right SFA above-knee popliteal stents were occluded.  I did use a Glidewire advantage into the right SFA with a quick cross catheter and tried to see if I could easily cross her SFA stent occlusion to give us  other options for endovascular therapy.  Ultimately I met significant resistance in the  SFA just before the stent occlusion and this was all chronically occluded.  I was ultimately subintimal.  I elected that her best option would be right leg bypass for future revascularization.  I did do a pullback pressure in the right external iliac artery where  we saw some disease with a straight cath but no significant gradient was appreciated.  Wires and catheters were removed.  Sheath in her groin was removed.  Taken to holding in stable condition.  Plan: She denies any pain in her right foot at this time.  Her TMA is healed.  Will follow-up in 4 to 6 weeks in the office.  She would need right common femoral to below-knee popliteal bypass for future revascularization.  Lonni DOROTHA Gaskins, MD [facility] Office: [phone]   "

## 2024-05-19 ENCOUNTER — Other Ambulatory Visit (HOSPITAL_COMMUNITY)

## 2024-05-19 ENCOUNTER — Ambulatory Visit (HOSPITAL_COMMUNITY): Admitting: Internal Medicine

## 2024-05-25 ENCOUNTER — Ambulatory Visit: Admitting: Orthopedic Surgery

## 2024-05-27 ENCOUNTER — Ambulatory Visit (HOSPITAL_BASED_OUTPATIENT_CLINIC_OR_DEPARTMENT_OTHER): Admitting: Cardiology

## 2024-05-28 ENCOUNTER — Ambulatory Visit (HOSPITAL_BASED_OUTPATIENT_CLINIC_OR_DEPARTMENT_OTHER)
Admission: RE | Admit: 2024-05-28 | Discharge: 2024-05-28 | Disposition: A | Discharge status: Home / Self Care | Source: Ambulatory Visit | Attending: Internal Medicine | Admitting: Internal Medicine

## 2024-05-28 ENCOUNTER — Ambulatory Visit (HOSPITAL_COMMUNITY)
Admission: RE | Admit: 2024-05-28 | Discharge: 2024-05-28 | Disposition: A | Discharge status: Home / Self Care | Source: Ambulatory Visit | Attending: *Deleted | Admitting: *Deleted

## 2024-05-28 VITALS — BP 120/68 | HR 82

## 2024-05-28 DIAGNOSIS — I5022 Chronic systolic (congestive) heart failure: Secondary | ICD-10-CM | POA: Diagnosis not present

## 2024-05-28 DIAGNOSIS — Z89431 Acquired absence of right foot: Secondary | ICD-10-CM | POA: Diagnosis not present

## 2024-05-28 DIAGNOSIS — E1122 Type 2 diabetes mellitus with diabetic chronic kidney disease: Secondary | ICD-10-CM | POA: Diagnosis not present

## 2024-05-28 DIAGNOSIS — Z952 Presence of prosthetic heart valve: Secondary | ICD-10-CM

## 2024-05-28 DIAGNOSIS — Z79899 Other long term (current) drug therapy: Secondary | ICD-10-CM | POA: Insufficient documentation

## 2024-05-28 DIAGNOSIS — I739 Peripheral vascular disease, unspecified: Secondary | ICD-10-CM | POA: Diagnosis not present

## 2024-05-28 DIAGNOSIS — I2510 Atherosclerotic heart disease of native coronary artery without angina pectoris: Secondary | ICD-10-CM | POA: Diagnosis not present

## 2024-05-28 DIAGNOSIS — N1832 Chronic kidney disease, stage 3b: Secondary | ICD-10-CM

## 2024-05-28 DIAGNOSIS — E785 Hyperlipidemia, unspecified: Secondary | ICD-10-CM | POA: Diagnosis not present

## 2024-05-28 DIAGNOSIS — I130 Hypertensive heart and chronic kidney disease with heart failure and stage 1 through stage 4 chronic kidney disease, or unspecified chronic kidney disease: Secondary | ICD-10-CM | POA: Insufficient documentation

## 2024-05-28 DIAGNOSIS — Z7902 Long term (current) use of antithrombotics/antiplatelets: Secondary | ICD-10-CM | POA: Diagnosis not present

## 2024-05-28 DIAGNOSIS — Z7982 Long term (current) use of aspirin: Secondary | ICD-10-CM | POA: Diagnosis not present

## 2024-05-28 DIAGNOSIS — Z8614 Personal history of Methicillin resistant Staphylococcus aureus infection: Secondary | ICD-10-CM | POA: Diagnosis not present

## 2024-05-28 DIAGNOSIS — I493 Ventricular premature depolarization: Secondary | ICD-10-CM | POA: Diagnosis not present

## 2024-05-28 DIAGNOSIS — Z89512 Acquired absence of left leg below knee: Secondary | ICD-10-CM | POA: Diagnosis not present

## 2024-05-28 DIAGNOSIS — Z95820 Peripheral vascular angioplasty status with implants and grafts: Secondary | ICD-10-CM | POA: Diagnosis not present

## 2024-05-28 DIAGNOSIS — I252 Old myocardial infarction: Secondary | ICD-10-CM | POA: Diagnosis not present

## 2024-05-28 DIAGNOSIS — I350 Nonrheumatic aortic (valve) stenosis: Secondary | ICD-10-CM | POA: Diagnosis not present

## 2024-05-28 DIAGNOSIS — D500 Iron deficiency anemia secondary to blood loss (chronic): Secondary | ICD-10-CM | POA: Diagnosis not present

## 2024-05-28 LAB — ECHOCARDIOGRAM COMPLETE
AR max vel: 1.87 cm2
AV Area VTI: 1.83 cm2
AV Area mean vel: 1.76 cm2
AV Mean grad: 5 mmHg
AV Peak grad: 8.9 mmHg
Ao pk vel: 1.49 m/s
Area-P 1/2: 3.54 cm2
Calc EF: 28.6 %
MV VTI: 1.77 cm2
S' Lateral: 5.2 cm
Single Plane A2C EF: 26.4 %
Single Plane A4C EF: 36.9 %

## 2024-05-28 MED ORDER — PERFLUTREN LIPID MICROSPHERE
1.0000 mL | INTRAVENOUS | Status: AC | PRN
  Administered 2024-05-28: 2 mL via INTRAVENOUS

## 2024-05-28 MED ORDER — LOSARTAN POTASSIUM 25 MG PO TABS: 25.0000 mg | ORAL_TABLET | Freq: Every day | ORAL | 3 refills | Status: AC

## 2024-05-28 NOTE — Patient Instructions (Addendum)
 Good to see you today!  START Losartan  25 mg  Your physician recommends that you return for lab work in: 1-2 weeks  Your physician recommends that you schedule a follow-up appointment in: 3-4 months   If you have any questions or concerns before your next appointment please send us  a message through [place] or call our office at [phone].    TO LEAVE A MESSAGE FOR THE NURSE SELECT OPTION 2, PLEASE LEAVE A MESSAGE INCLUDING: YOUR NAME DATE OF BIRTH CALL BACK NUMBER REASON FOR CALL**this is important as we prioritize the call backs  YOU WILL RECEIVE A CALL BACK THE SAME DAY AS LONG AS YOU CALL BEFORE 4:00 PM At the [facility], you and your health needs are our priority. As part of our continuing mission to provide you with exceptional heart care, we have created designated Provider Care Teams. These Care Teams include your primary Cardiologist (physician) and Advanced Practice Providers (APPs- Physician Assistants and Nurse Practitioners) who all work together to provide you with the care you need, when you need it.   You may see any of the following providers on your designated Care Team at your next follow up: Dr Toribio Fuel Dr Ezra Shuck Dr. Morene Brownie Greig Mosses, NP Caffie Shed, [place] [facility], [place] Beckey Coe, NP Jordan Lee, NP Ellouise Class, NP Tinnie Redman, PharmD Jaun Bash, PharmD   Please be sure to bring in all your medications bottles to every appointment.    Thank you for choosing [facility]

## 2024-05-28 NOTE — Progress Notes (Addendum)
 "  ADVANCED HF CLINIC NOTE  PCP: [name], NP Primary Cardiologist: Diannah SHAUNNA Maywood, MD HF Cardiologist: Dr. Cherrie  HPI: 59 y.o. female with history of PAD s/p b/l SFA stents and bilateral TMA, CAD, severe AS s/p TAVR, DM II, HTN, HLD, chronic anemia/hx GI bleed 2/2 AVM.    Admitted 4/24 with NSTEMI. Cath 3V CAD and significant MR. CABG and MVR recommended, but surgery deferred d/t multiple medical issues.   Admitted 6/25 for MRSA bacteremia and necrotizing fasciitis. Required L BKA followed by revision. TEE 6/25 with EF 60-65%, mild to moderate MR and moderate to severe AS, but no evidence of endocarditis. Four week course of IV abx with daptomycin  recommended. Discharged 10/29/23 to CIR for rehab.   Transferred back to [facility] 7/25 with a/c HF, and concern for low-output with acute respiratory failure. She was diuresed with IV lasix , but SCr continued to rise. She transiently required CRRT. Eventually underwent RHC showing severely elevated PCWP, mildly elevated RAP, preserved CO. No ischemic eval due to AKI. Course complicated by hematuria, requiring PRBCs. She went to OR for cystoscopy and declotting of bladder.   R/LHC 01/17/24 demonstrated multivessel CAD relatively unchanged versus prior study with occluded left circumflex, high-grade mid LAD, and moderate right coronary artery disease. RHC RA 8, PAP 53/29 (40), PCWP 29 w/ v waves to 44 mmHg, CO 6.7 L/min and Fick cardiac index of 3.6 L/min/m, PVR 1.6 WU, PAPi 3.   Underwent TAVR 04/07/24  Admitted 1/26 with due to occlusion of R SFA stents. Unable to PCI. Plan for surgical revasc with Dr. Gretta  Echo today 05/28/24 EF 30-35% AVR ok   Here for f/u with her mother. Feels great. No CP or SOB. Compliant with meds. Midodrine  stopped in 12/25. SBP 120-130s    Cardiac Studies - Echo (9/25): EF 25%, b/l pleural effusions, at least moderate MR, aortic valve not fully interrogated - RHC (9/25): severely elevated PCWP 29 mmHg  with V waves to 44 mmHg , RA 8, CI 3.6, PAPi 3 - RHC (8/25): RA 8, PA 60/25 (40), PCWP mean 29 with prominent v waves to 43, CO/CI (Fick) 5.28/2.77, PVR 2.1 WU - Ltd echo (7/25): EF 25-30%, G2DD, mild to moderate MR, moderate to severe AS - Echo (7/25): EF 25%, RV normal, mild to moderate MR, mild AS - TEE (6/25): LVEF 60-65%, RV normal, no thrombus, moderate to severe AS (mean gradient 27 mmHg, AVA per VTI 1.07 cm) - R/LHC (4/24): severe 3v CAD, severe MR; RA 9, PA 42/24 (32), PCWP 24 with v waves to 45, CO/CI (Fick) 8.58/4.26  Past Medical History:  Diagnosis Date   Anemia    Arthritis    CHF (congestive heart failure) (HCC)    Coronary artery disease    Diabetes mellitus without complication (HCC)    type 2   GERD (gastroesophageal reflux disease)    History of blood transfusion    Hypercholesteremia    Hypertension    Hypothyroidism    Mitral regurgitation    Myocardial infarction [facility])    Peripheral vascular disease    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 Wendel and Dr. Lucas   Severe aortic stenosis    Thyroid  disease    Current Outpatient Medications  Medication Sig Dispense Refill   amoxicillin  (AMOXIL ) 500 MG tablet Take 4 tablets (2,000 mg total) by  mouth as directed. 1 hour prior to dental work including cleanings 12 tablet 12   aspirin  EC 81 MG tablet Take 1 tablet (81 mg total) by mouth daily. 30 tablet 3   atorvastatin  (LIPITOR) 40 MG tablet Take 40 mg by mouth daily.     cetirizine (ZYRTEC) 10 MG tablet Take 10 mg by mouth daily.     clopidogrel  (PLAVIX ) 75 MG tablet Take 1 tablet (75 mg total) by mouth daily. 90 tablet 3   diphenhydrAMINE  HCl, Sleep, (ZZZQUIL) 25 MG CAPS Take 25 mg by mouth at bedtime.     ferrous sulfate  325 (65 FE) MG EC tablet Take 1 tablet (325 mg total) by mouth in the morning and at bedtime. (Patient taking  differently: Take 325 mg by mouth every other day.) 60 tablet 0   gabapentin  (NEURONTIN ) 300 MG capsule Take 300 mg by mouth at bedtime.     hydrocortisone  cream 1 % Apply 1 Application topically daily as needed for itching.     insulin  degludec (TRESIBA  FLEXTOUCH) 100 UNIT/ML FlexTouch Pen Inject 6 Units into the skin at bedtime. (Patient taking differently: Inject 16 Units into the skin at bedtime.)     levothyroxine  (SYNTHROID ) 150 MCG tablet Take 150 mcg by mouth daily before breakfast.     metolazone  (ZAROXOLYN ) 2.5 MG tablet Take 1 tablet (2.5 mg total) by mouth once a week. Every Tuesday. Take an extra 40 mEq of Potassium with Metolazone  20 tablet 3   metoprolol  succinate (TOPROL  XL) 25 MG 24 hr tablet Take 1 tablet (25 mg total) by mouth daily. 90 tablet 3   mexiletine (MEXITIL ) 150 MG capsule Take 1 capsule (150 mg total) by mouth 2 (two) times daily. 60 capsule 3   nitroGLYCERIN  (NITROSTAT ) 0.4 MG SL tablet Place 1 tablet (0.4 mg total) under the tongue every 5 (five) minutes x 3 doses as needed for chest pain (if no relief after 3rd dose proceed to ED or call 911). 11/06/2022-New 25 tablet 3   pantoprazole  (PROTONIX ) 40 MG tablet Take 40 mg by mouth daily.     potassium chloride  (KLOR-CON  M) 20 MEQ tablet Take 60 meq in am and 40 meq in pm 150 tablet 11   promethazine  (PHENERGAN ) 25 MG tablet Take 1 tablet (25 mg total) by mouth every 6 (six) hours as needed for nausea or vomiting. 20 tablet 0   torsemide  (DEMADEX ) 100 MG tablet Take 1 tablet (100 mg total) by mouth 2 (two) times daily. 60 tablet 1   Furosemide  (FUROSCIX ) 80 MG/10ML CTKT Inject 80 mg into the skin daily as needed. ONLY AS DIRECT BY THE [facility] (Patient not taking: Reported on 05/28/2024) 2 each 1   Multiple Vitamins-Minerals (MULTIVITAMIN WITH MINERALS) tablet Take 1 tablet by mouth daily. CertaVite (Patient not taking: Reported on 05/28/2024)     OXYGEN  Inhale 2-3 L into the lungs daily as needed  (Breathing). (Patient not taking: Reported on 05/28/2024)     No current facility-administered medications for this encounter.   Facility-Administered Medications Ordered in Other Encounters  Medication Dose Route Frequency Provider Last Rate Last Admin   perflutren  lipid microspheres (DEFINITY ) IV suspension  1-10 mL Intravenous PRN Sebastian Lamarr SAUNDERS, PA-C   2 mL at 05/28/24 1406   Allergies  Allergen Reactions   Trental  [Pentoxifylline ] Nausea And Vomiting   Vibramycin [Doxycycline] Nausea And Vomiting   Social History   Socioeconomic History   Marital status: Divorced    Spouse name: Not on file  Number of children: 0   Years of education: Not on file   Highest education level: Not on file  Occupational History   Not on file  Tobacco Use   Smoking status: Former    Current packs/day: 0.00    Types: Cigarettes    Quit date: 01/2017    Years since quitting: 7.3   Smokeless tobacco: Never  Vaping Use   Vaping status: Never Used  Substance and Sexual Activity   Alcohol use: Not Currently   Drug use: Yes    Types: Marijuana    Comment: on ocassion- Last time - 11/13/22   Sexual activity: Yes    Birth control/protection: Post-menopausal  Other Topics Concern   Not on file  Social History Narrative   Not on file   Social Drivers of Health   Tobacco Use: Medium Risk (04/15/2024)   Patient History    Smoking Tobacco Use: Former    Smokeless Tobacco Use: Never    Passive Exposure: Not on file  Financial Resource Strain: Low Risk (10/19/2023)   Received [facility]   Overall Financial Resource Strain (CARDIA)    How hard is it for you to pay for the very basics like food, housing, medical care, and heating?: Not hard at all  Food Insecurity: No Food Insecurity (04/07/2024)   Epic    Worried About Programme Researcher, Broadcasting/film/video in the Last Year: Never true    Ran Out of Food in the Last Year: Never true  Transportation Needs: No Transportation Needs (04/07/2024)   Epic     Lack of Transportation (Medical): No    Lack of Transportation (Non-Medical): No  Physical Activity: Not on file  Stress: Not on file  Social Connections: Unknown (12/15/2023)   Social Connection and Isolation Panel    Frequency of Communication with Friends and Family: Three times a week    Frequency of Social Gatherings with Friends and Family: Three times a week    Attends Religious Services: Not on file    Active Member of Clubs or Organizations: Not on file    Attends Club or Organization Meetings: Not on file    Marital Status: Not on file  Intimate Partner Violence: Not At Risk (04/07/2024)   Epic    Fear of Current or Ex-Partner: No    Emotionally Abused: No    Physically Abused: No    Sexually Abused: No  Depression (PHQ2-9): Not on file  Alcohol Screen: Not on file  Housing: Low Risk (04/07/2024)   Epic    Unable to Pay for Housing in the Last Year: No    Number of Times Moved in the Last Year: 0    Homeless in the Last Year: No  Utilities: Not At Risk (04/07/2024)   Epic    Threatened with loss of utilities: No  Health Literacy: Not on file   Family History  Problem Relation Age of Onset   Diabetes Other    CAD Other    Wt Readings from Last 3 Encounters:  05/14/24 90.7 kg (200 lb)  04/20/24 92.5 kg (204 lb)  04/08/24 98.2 kg (216 lb 7.9 oz)   BP 120/68   Pulse 82   SpO2 100%   PHYSICAL EXAM: General:  NAD. No resp difficulty, arrived in [facility] HEENT: normal Neck: supple. no JVD.  Cor: Regular rate & rhythm. No rubs, gallops or murmurs. Lungs: clear Abdomen: soft, nontender, nondistended.Good bowel sounds. Extremities: no cyanosis, clubbing, rash, edema  s/p L  BKA Neuro: alert & orientedx3, cranial nerves grossly intact. moves all 4 extremities w/o difficulty. Affect pleasant   ReDs reading: n/a  ASSESSMENT & PLAN: Chronic Systolic Heart Failure - TEE 6/25: EF 60-65%, RV okay, mild to mod MR, moderate-severe AS with mG 27 mmHg and AVA 1.07 cm2 -  Admitted for mixed shock (CGS and septic) - Echo 11/15/23: EF 25% - RHC (7/25) aborted 2/2 flash pulmonary edema  - Echo 9/25 EF 25-30%, G2DD, mild to mod MR, mod-severe AS  - RHC (8/25): severely elevated PCWP, mildly elevated RAP, preserved CO.  - RHC (9/25): severely elevated PCWP 29 mmHg with V waves to 44 mmHg , RA 8, CI 3.6, PAPi 3 - Echo today 05/28/24: EF 30-35% TAR ok  - GDMT limited by CKD and need for midodrine . Now off midodrine  since 12/23 - Now on torsemide  100 mg bid + 40 KCL bid. - Using metolazone  2.5 mg + extra 40 KCL every Sunday. - Start losartan  25  - Continue Toprol  25 daily - Not on SGLT2i with hx UTI, does not want to retrial - Repeat BMET 1-2 weeks after starting losartan  - will attempt to titrate GDMT now that she is off midodrine . If EF not improving will need ICD eval  - NYHA class I symptoms.   2. CAD - NSTEMI in 4/24. Multivessel CAD. CABG had been recommended but managed medically due to other active medical issues - Repeat LHC 9/25 for TAVR w/u. CAD stable from prior study. Planning continued medical management given stable disease and lack of anginal symptoms. - No s/s angina. Continue medical management - Continue DAPT with ASA + Plavix . - Continue atorvastatin  40 mg daily.  3. Valvular heart disease - Mild to moderate MR and moderate to severe AS on echo 6/25 - Echo 11/20/23 moderate to severe AS,. Vmax 3.21 w/ DVI 0.26, mG 22 mmHg - Underwent TAVR 04/07/24 - Echo today 05/28/24 EF 30-35% AVR ok    4. 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 PAD - hx SFA stents - On aspirin /plavix  + statin.  - Admitted 1/26 with due to occlusion of R SFA stents. Unable to PCI. Plan for surgical revasc   6. CKD IIIb - recent AKIs in setting of volume overload/low-output>>Started on CVVH 11/16/23, stopped 11/19/23, followed by recurrent AKI 2/2 bladder outlet obstruction from extensive clots in bladder.  S/p cystoscopy and  clot removal - Baseline SCr now ~ 2.5 - Last Scr 2.1 on 05/14/24 - no SGLT2i as above - Follows with [facility] - Start losartan  25 - BMET 1-2 week  7. PVCs - Frequent on previous EKGs. Has been asymptomatic  - 2 week zio 9/25: SR avg rate 82 bpm, SVT runs (longest 1 min 19 seconds), 13% PVC burden - Sleep study ordered, will see if we can arrange this - Continue mexiletine 150 mg bid  8. Chronic Anemia - Hx GI bleed from AVMs. ? Heyde syndrome - Required transfusions 7/25 and 9/25 - denies any recent gross bleeding   Toribio Cherrie COME 05/28/24  "

## 2024-05-29 ENCOUNTER — Ambulatory Visit: Payer: Self-pay | Admitting: Physician Assistant

## 2024-05-29 NOTE — Addendum Note (Signed)
 Encounter addended by: Sebastian Lamarr SAUNDERS, PA-C on: 05/29/2024 10:58 AM  Actions taken: Flowsheet accepted, Clinical Note Signed

## 2024-06-04 ENCOUNTER — Encounter: Payer: Self-pay | Admitting: Orthopedic Surgery

## 2024-06-04 ENCOUNTER — Ambulatory Visit: Admitting: Orthopedic Surgery

## 2024-06-04 DIAGNOSIS — Z89512 Acquired absence of left leg below knee: Secondary | ICD-10-CM

## 2024-06-04 NOTE — Progress Notes (Signed)
 "  Office Visit Note   Patient: Kayla Schmitt           Date of Birth: 1966-04-30           MRN: [id number] Visit Date: 06/04/2024              Requested by: [name], NP [address] [place],  [place] [address] PCP: [name], NP  Chief Complaint  Patient presents with   Left Leg - Follow-up    Hx BKA 09/2023      HPI: Discussed the use of AI scribe software for clinical note transcription with the patient, who gave verbal consent to proceed.  History of Present Illness Jeryl Guillermina Shaft is a 59 year old female with left below-knee amputation who presents for follow-up after receiving her prosthesis.  She received her left below-knee prosthesis one week ago and has been ambulating with it since. She uses a shrinker when not wearing the prosthesis and reports that the residual limb appears healthy. She is currently performing gait training at home, utilizing her house's layout to walk in continuous loops without interruption.  She is also performing home exercises to improve mobility and strength.  She notes decreased strength in her legs, which she attributes to prolonged inactivity following amputation. She is motivated to improve her leg strength through therapy and exercise. She denies any current issues with the prosthesis or residual limb.  She previously experienced significant limitations in exercise tolerance due to cardiac issues and dyspnea, but following heart valve repair, she is now able to breathe well, exercise, and is no longer dependent on supplemental oxygen .     Assessment & Plan: Visit Diagnoses:  1. S/P BKA (below knee amputation) unilateral, left (HCC)     Plan: Assessment and Plan Assessment & Plan Left below-knee amputation with prosthesis management Recent prosthesis fitting with no current issues. Motivated to improve strength and mobility. - Prescribed physical therapy in [place] for gait training and  strengthening. - Instructed to follow up if any issues arise with the prosthesis or amputation site.      Follow-Up Instructions: Return if symptoms worsen or fail to improve.   Ortho Exam  Patient is alert, oriented, no adenopathy, well-dressed, normal affect, normal respiratory effort. Physical Exam EXTREMITIES: Stable left below the amputation.  There is no cellulitis no ulceration no drainage.       Imaging: No results found. No images are attached to the encounter.  Labs: Lab Results  Component Value Date   HGBA1C 5.5 10/28/2023   HGBA1C 6.4 (H) 08/02/2022   HGBA1C 11.0 (H) 01/11/2020   ESRSEDRATE 136 (H) 11/11/2023   ESRSEDRATE 113 (H) 11/04/2023   ESRSEDRATE 136 (H) 01/14/2020   CRP 21.8 (H) 11/11/2023   CRP 7.4 (H) 11/04/2023   CRP 11.3 (H) 01/14/2020   LABURIC 7.7 (H) 02/04/2022   REPTSTATUS 11/17/2023 FINAL 11/15/2023   GRAMSTAIN  11/15/2023    ABUNDANT WBC PRESENT,BOTH PMN AND MONONUCLEAR NO ORGANISMS SEEN    CULT  11/15/2023    RARE Normal respiratory flora-no Staph aureus or Pseudomonas seen Performed at [facility], [address], [place], [place] [address]      Lab Results  Component Value Date   ALBUMIN  3.0 (L) 04/03/2024   ALBUMIN  3.1 (L) 12/15/2023   ALBUMIN  3.2 (L) 11/30/2023    Lab Results  Component Value Date   MG 2.0 04/08/2024   MG 2.2 01/22/2024   MG 2.3 01/21/2024  Lab Results  Component Value Date   VD25OH 50.60 10/31/2023   VD25OH 42.44 10/30/2023    No results found for: PREALBUMIN    Latest Ref Rng & Units 05/14/2024    6:31 AM 04/08/2024    3:05 AM 04/07/2024    9:30 AM  CBC EXTENDED  WBC 4.0 - 10.5 K/uL  6.8    RBC 3.87 - 5.11 MIL/uL  2.96    Hemoglobin 12.0 - 15.0 g/dL 89.0  8.2  9.2   HCT 63.9 - 46.0 % 32.0  28.2  27.0   Platelets 150 - 400 K/uL  252       There is no height or weight on file to calculate BMI.  Orders:  No orders of the defined types were placed in this encounter.  No  orders of the defined types were placed in this encounter.    Procedures: No procedures performed  Clinical Data: No additional findings.  ROS:  All other systems negative, except as noted in the HPI. Review of Systems  Objective: Vital Signs: There were no vitals taken for this visit.  Specialty Comments:  No specialty comments available.  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   Acute on chronic systolic (congestive) heart failure (HCC) 12/15/2023   Chronic anemia 12/15/2023   CKD stage 3b, GFR 30-44 ml/min (HCC) 11/23/2023   Acute respiratory failure with hypoxia (HCC) 11/13/2023   Acute on chronic systolic CHF (congestive heart failure) (HCC) 11/12/2023   S/P BKA (below knee amputation) unilateral, left (HCC) 10/29/2023   Necrotizing fasciitis (HCC) 10/22/2023   History of GI bleed 10/20/2023   Prolonged QT interval 10/20/2023   AVM (arteriovenous malformation) of small bowel, acquired 04/07/2023   Osteomyelitis of foot, right, acute (HCC) 12/12/2022   CAD (coronary artery disease) 11/06/2022   Severe aortic stenosis 11/06/2022   Mitral regurgitation 08/27/2022   Critical limb ischemia of both lower extremities (HCC) 06/19/2022   Type 2 diabetes mellitus with complication (HCC) 01/28/2020   Hyperlipidemia 01/28/2020   Hypertension 01/28/2020   Hypothyroidism 01/28/2020   Obesity, class 1 01/28/2020   PVD (peripheral vascular disease)    Blister of leg 01/06/2020   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/28/2017   GERD (gastroesophageal reflux disease) 10/28/2017   Past Medical History:  Diagnosis Date   Anemia    Arthritis    CHF (congestive heart failure) (HCC)    Coronary artery disease    Diabetes mellitus without complication (HCC)    type 2   GERD (gastroesophageal reflux disease)    History of blood transfusion    Hypercholesteremia    Hypertension     Hypothyroidism    Mitral regurgitation    Myocardial infarction [facility])    Peripheral vascular disease    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 Wendel and Dr. Lucas   Severe aortic stenosis    Thyroid  disease     Family History  Problem Relation Age of Onset   Diabetes Other    CAD Other     Past Surgical History:  Procedure Laterality Date   ABDOMINAL AORTOGRAM N/A 05/14/2024   Procedure: ABDOMINAL AORTOGRAM;  Surgeon: Gretta Lonni PARAS, MD;  Location: [facility] INVASIVE CV LAB;  Service: Cardiovascular;  Laterality: N/A;   ABDOMINAL AORTOGRAM W/LOWER EXTREMITY  Bilateral 01/14/2020   Procedure: ABDOMINAL AORTOGRAM W/LOWER EXTREMITY;  Surgeon: Gretta Lonni PARAS, MD;  Location: [facility] INVASIVE CV LAB;  Service: Cardiovascular;  Laterality: Bilateral;   ABDOMINAL AORTOGRAM W/LOWER EXTREMITY N/A 07/05/2022   Procedure: ABDOMINAL AORTOGRAM W/LOWER EXTREMITY;  Surgeon: Gretta Lonni PARAS, MD;  Location: [facility];  Service: Cardiovascular;  Laterality: N/A;   ABDOMINAL AORTOGRAM W/LOWER EXTREMITY N/A 08/02/2022   Procedure: ABDOMINAL AORTOGRAM W/LOWER EXTREMITY;  Surgeon: Gretta Lonni PARAS, MD;  Location: [facility];  Service: Cardiovascular;  Laterality: N/A;   ABDOMINAL AORTOGRAM W/LOWER EXTREMITY N/A 08/06/2022   Procedure: ABDOMINAL AORTOGRAM W/LOWER EXTREMITY;  Surgeon: Gretta Lonni PARAS, MD;  Location: [facility];  Service: Cardiovascular;  Laterality: N/A;   AMPUTATION Bilateral 07/20/2022   Procedure: AMPUTATION RIGHT GREAT TOE AND SECOND TOE, AMPUTATION LEFT GREAT TOE;  Surgeon: Harden Jerona GAILS, MD;  Location: [facility] OR;  Service: Orthopedics;  Laterality: Bilateral;   AMPUTATION Bilateral 08/08/2022   Procedure: BILATERAL TRANSMETATARSAL AMPUTATION;  Surgeon: Harden Jerona GAILS, MD;  Location: [facility] OR;  Service: Orthopedics;  Laterality: Bilateral;    AMPUTATION Left 10/22/2023   Procedure: AMPUTATION BELOW KNEE;  Surgeon: Lanis Fonda BRAVO, MD;  Location: [facility] OR;  Service: Vascular;  Laterality: Left;   AMPUTATION TOE     BIOPSY  04/06/2023   Procedure: BIOPSY;  Surgeon: Mykenna Elspeth SQUIBB, MD;  Location: [facility] ENDOSCOPY;  Service: Gastroenterology;;   COLONOSCOPY WITH PROPOFOL  N/A 04/06/2023   Procedure: COLONOSCOPY WITH PROPOFOL ;  Surgeon: Jeanet Elspeth SQUIBB, MD;  Location: [facility] ENDOSCOPY;  Service: Gastroenterology;  Laterality: N/A;   CYSTOSCOPY WITH STENT PLACEMENT N/A 11/30/2023   Procedure: CYSTOSCOPY, WITH FULGURATION OF BLEEDING AND CLOT EVACUATION;  Surgeon: Roseann Adine PARAS., MD;  Location: [facility] OR;  Service: Urology;  Laterality: N/A;  CLOT EVACUATION OF BLADDER   ESOPHAGOGASTRODUODENOSCOPY (EGD) WITH PROPOFOL  N/A 04/06/2023   Procedure: ESOPHAGOGASTRODUODENOSCOPY (EGD) WITH PROPOFOL ;  Surgeon: Empress Elspeth SQUIBB, MD;  Location: [facility] ENDOSCOPY;  Service: Gastroenterology;  Laterality: N/A;   GIVENS CAPSULE STUDY N/A 04/06/2023   Procedure: GIVENS CAPSULE STUDY;  Surgeon: Anylah Elspeth SQUIBB, MD;  Location: [facility] ENDOSCOPY;  Service: Gastroenterology;  Laterality: N/A;   LOWER EXTREMITY ANGIOGRAPHY N/A 05/14/2024   Procedure: Lower Extremity Angiography;  Surgeon: Gretta Lonni PARAS, MD;  Location: [facility] INVASIVE CV LAB;  Service: Cardiovascular;  Laterality: N/A;   LOWER EXTREMITY INTERVENTION N/A 05/14/2024   Procedure: LOWER EXTREMITY INTERVENTION;  Surgeon: Gretta Lonni PARAS, MD;  [facility];  Service: Cardiovascular;  Laterality: N/A;   PERIPHERAL VASCULAR ATHERECTOMY Right 01/14/2020   Procedure: PERIPHERAL VASCULAR ATHERECTOMY;  Surgeon: Gretta Lonni PARAS, MD;  Location: [facility];  Service: Cardiovascular;  Laterality: Right;  sfa   PERIPHERAL VASCULAR INTERVENTION Right 01/14/2020   Procedure: PERIPHERAL VASCULAR INTERVENTION;  Surgeon: Gretta Lonni PARAS, MD;  Location: [facility];  Service:  Cardiovascular;  Laterality: Right;  sfa stent    PERIPHERAL VASCULAR INTERVENTION  07/05/2022   Procedure: PERIPHERAL VASCULAR INTERVENTION;  Surgeon: Gretta Lonni PARAS, MD;  [facility]: [facility];  Service: Cardiovascular;;   PERIPHERAL VASCULAR INTERVENTION  08/02/2022   Procedure: PERIPHERAL VASCULAR INTERVENTION;  Surgeon: Gretta Lonni PARAS, MD;  [facility]: [facility];  Service: Cardiovascular;;   PERIPHERAL VASCULAR INTERVENTION  08/06/2022   Procedure: PERIPHERAL VASCULAR INTERVENTION;  Surgeon: Gretta Lonni PARAS, MD;  Location: [facility] INVASIVE CV LAB;  Service: Cardiovascular;;   PERIPHERAL VASCULAR THROMBECTOMY  08/02/2022   Procedure: PERIPHERAL VASCULAR  THROMBECTOMY;  Surgeon: Gretta Lonni PARAS, MD;  Location: [facility] INVASIVE CV LAB;  Service: Cardiovascular;;   REVISION AMPUTATION, BELOW THE KNEE Left 10/23/2023   Procedure: REVISION AMPUTATION, BELOW THE KNEE;  Surgeon: Harden Jerona GAILS, MD;  Location: [facility] OR;  Service: Orthopedics;  Laterality: Left;  REVISION LEFT BELOW KNEE AMPUTATION   RIGHT HEART CATH N/A 11/29/2023   Procedure: RIGHT HEART CATH;  Surgeon: Rolan Ezra RAMAN, MD;  Location: [facility] INVASIVE CV LAB;  Service: Cardiovascular;  Laterality: N/A;   RIGHT HEART CATH AND CORONARY ANGIOGRAPHY N/A 01/17/2024   Procedure: RIGHT HEART CATH AND CORONARY ANGIOGRAPHY;  Surgeon: Wendel Lurena POUR, MD;  Location: [facility];  Service: Cardiovascular;  Laterality: N/A;   RIGHT/LEFT HEART CATH AND CORONARY ANGIOGRAPHY N/A 08/23/2022   Procedure: RIGHT/LEFT HEART CATH AND CORONARY ANGIOGRAPHY;  Surgeon: Dann Candyce RAMAN, MD;  Location: [facility] INVASIVE CV LAB;  Service: Cardiovascular;  Laterality: N/A;   SKIN SPLIT GRAFT Right 03/18/2020   Procedure: SKIN GRAFTING RIGHT FOOT ULCER;  Surgeon: Harden Jerona GAILS, MD;  Location: [facility] OR;  Service: Orthopedics;  Laterality: Right;   STUMP REVISION Right 11/30/2022   Procedure: REVISION RIGHT TRANSMETATARSAL AMPUTATION;  Surgeon: Harden Jerona GAILS, MD;  Location: [facility] OR;  Service: Orthopedics;  Laterality: Right;   TEE WITHOUT CARDIOVERSION N/A 08/27/2022   Procedure: TRANSESOPHAGEAL ECHOCARDIOGRAM;  Surgeon: Hobart Powell BRAVO, MD;  Location: [facility] INVASIVE CV LAB;  Service: Cardiovascular;  Laterality: N/A;   TRANSCATHETER AORTIC VALVE REPLACEMENT,SUBCLAVIAN Left 04/07/2024   Procedure: Transcatheter Aortic Valve Replacement-Subclavian;  Surgeon: Wendel Lurena POUR, MD;  Location: [facility];  Service: Cardiovascular;  Laterality: Left;   TRANSESOPHAGEAL ECHOCARDIOGRAM (CATH LAB) N/A 10/24/2023   Procedure: TRANSESOPHAGEAL ECHOCARDIOGRAM;  Surgeon: Michele Richardson, DO;  Location: [facility];  Service: Cardiovascular;  Laterality: N/A;   TRANSESOPHAGEAL ECHOCARDIOGRAM (CATH LAB) N/A 04/07/2024   Procedure: TRANSESOPHAGEAL ECHOCARDIOGRAM;  Surgeon: Wendel Lurena POUR, MD;  Location: [facility];  Service: Cardiovascular;  Laterality: N/A;   WISDOM TOOTH EXTRACTION     Social History   Occupational History   Not on file  Tobacco Use   Smoking status: Former    Current packs/day: 0.00    Types: Cigarettes    Quit date: 01/2017    Years since quitting: 7.3   Smokeless tobacco: Never  Vaping Use   Vaping status: Never Used  Substance and Sexual Activity   Alcohol use: Not Currently   Drug use: Yes    Types: Marijuana    Comment: on ocassion- Last time - 11/13/22   Sexual activity: Yes    Birth control/protection: Post-menopausal         "

## 2024-07-07 ENCOUNTER — Ambulatory Visit (HOSPITAL_COMMUNITY)

## 2024-07-07 ENCOUNTER — Encounter: Admitting: Vascular Surgery

## 2024-09-01 ENCOUNTER — Ambulatory Visit (HOSPITAL_COMMUNITY)

## 2025-03-15 ENCOUNTER — Ambulatory Visit (HOSPITAL_COMMUNITY)

## 2025-03-15 ENCOUNTER — Ambulatory Visit: Admitting: Physician Assistant
